# Patient Record
Sex: Female | Born: 1975 | Race: Black or African American | Hispanic: No | Marital: Single | Smoking: Current every day smoker
Health system: Southern US, Community
[De-identification: ages and names within clinical notes are randomized; demographics above are authoritative.]

## PROBLEM LIST (undated history)

## (undated) DIAGNOSIS — F79 Unspecified intellectual disabilities: Secondary | ICD-10-CM

## (undated) DIAGNOSIS — F329 Major depressive disorder, single episode, unspecified: Secondary | ICD-10-CM

## (undated) DIAGNOSIS — F3130 Bipolar disorder, current episode depressed, mild or moderate severity, unspecified: Secondary | ICD-10-CM

## (undated) DIAGNOSIS — F32A Depression, unspecified: Secondary | ICD-10-CM

## (undated) HISTORY — PX: SALPINGECTOMY: SHX328

## (undated) HISTORY — PX: NO PAST SURGERIES: SHX2092

---

## 1997-12-24 ENCOUNTER — Emergency Department (HOSPITAL_COMMUNITY): Admission: EM | Admit: 1997-12-24 | Discharge: 1997-12-24 | Payer: Self-pay | Admitting: Emergency Medicine

## 1998-02-28 ENCOUNTER — Emergency Department (HOSPITAL_COMMUNITY): Admission: EM | Admit: 1998-02-28 | Discharge: 1998-02-28 | Payer: Self-pay | Admitting: Emergency Medicine

## 1998-09-09 ENCOUNTER — Emergency Department (HOSPITAL_COMMUNITY): Admission: EM | Admit: 1998-09-09 | Discharge: 1998-09-09 | Payer: Self-pay | Admitting: Internal Medicine

## 1998-09-09 ENCOUNTER — Encounter: Payer: Self-pay | Admitting: Internal Medicine

## 1999-01-02 ENCOUNTER — Encounter: Payer: Self-pay | Admitting: Obstetrics

## 1999-01-02 ENCOUNTER — Inpatient Hospital Stay (HOSPITAL_COMMUNITY): Admission: AD | Admit: 1999-01-02 | Discharge: 1999-01-02 | Payer: Self-pay | Admitting: *Deleted

## 1999-01-06 ENCOUNTER — Inpatient Hospital Stay (HOSPITAL_COMMUNITY): Admission: AD | Admit: 1999-01-06 | Discharge: 1999-01-06 | Payer: Self-pay | Admitting: Obstetrics

## 1999-01-17 ENCOUNTER — Inpatient Hospital Stay (HOSPITAL_COMMUNITY): Admission: AD | Admit: 1999-01-17 | Discharge: 1999-01-17 | Payer: Self-pay | Admitting: *Deleted

## 1999-01-26 ENCOUNTER — Inpatient Hospital Stay (HOSPITAL_COMMUNITY): Admission: AD | Admit: 1999-01-26 | Discharge: 1999-01-28 | Payer: Self-pay | Admitting: *Deleted

## 2000-02-08 ENCOUNTER — Inpatient Hospital Stay (HOSPITAL_COMMUNITY): Admission: AD | Admit: 2000-02-08 | Discharge: 2000-02-08 | Payer: Self-pay | Admitting: Obstetrics & Gynecology

## 2000-09-24 ENCOUNTER — Inpatient Hospital Stay (HOSPITAL_COMMUNITY): Admission: AD | Admit: 2000-09-24 | Discharge: 2000-09-24 | Payer: Self-pay | Admitting: *Deleted

## 2001-12-19 ENCOUNTER — Emergency Department (HOSPITAL_COMMUNITY): Admission: EM | Admit: 2001-12-19 | Discharge: 2001-12-19 | Payer: Self-pay | Admitting: Emergency Medicine

## 2002-02-02 ENCOUNTER — Emergency Department (HOSPITAL_COMMUNITY): Admission: EM | Admit: 2002-02-02 | Discharge: 2002-02-02 | Payer: Self-pay | Admitting: Emergency Medicine

## 2003-03-15 ENCOUNTER — Emergency Department (HOSPITAL_COMMUNITY): Admission: EM | Admit: 2003-03-15 | Discharge: 2003-03-15 | Payer: Self-pay | Admitting: Emergency Medicine

## 2003-04-25 ENCOUNTER — Encounter: Payer: Self-pay | Admitting: Emergency Medicine

## 2003-04-25 ENCOUNTER — Ambulatory Visit (HOSPITAL_COMMUNITY): Admission: AD | Admit: 2003-04-25 | Discharge: 2003-04-25 | Payer: Self-pay | Admitting: *Deleted

## 2003-04-25 ENCOUNTER — Encounter (INDEPENDENT_AMBULATORY_CARE_PROVIDER_SITE_OTHER): Payer: Self-pay | Admitting: Specialist

## 2003-06-10 ENCOUNTER — Emergency Department (HOSPITAL_COMMUNITY): Admission: EM | Admit: 2003-06-10 | Discharge: 2003-06-10 | Payer: Self-pay | Admitting: Emergency Medicine

## 2004-02-28 ENCOUNTER — Emergency Department (HOSPITAL_COMMUNITY): Admission: EM | Admit: 2004-02-28 | Discharge: 2004-02-28 | Payer: Self-pay | Admitting: Emergency Medicine

## 2004-05-21 ENCOUNTER — Emergency Department (HOSPITAL_COMMUNITY): Admission: EM | Admit: 2004-05-21 | Discharge: 2004-05-21 | Payer: Self-pay | Admitting: Emergency Medicine

## 2004-05-23 ENCOUNTER — Inpatient Hospital Stay (HOSPITAL_COMMUNITY): Admission: AD | Admit: 2004-05-23 | Discharge: 2004-05-23 | Payer: Self-pay | Admitting: Obstetrics and Gynecology

## 2004-05-26 ENCOUNTER — Inpatient Hospital Stay (HOSPITAL_COMMUNITY): Admission: AD | Admit: 2004-05-26 | Discharge: 2004-05-26 | Payer: Self-pay | Admitting: Obstetrics and Gynecology

## 2004-05-29 ENCOUNTER — Inpatient Hospital Stay (HOSPITAL_COMMUNITY): Admission: AD | Admit: 2004-05-29 | Discharge: 2004-05-29 | Payer: Self-pay | Admitting: Obstetrics and Gynecology

## 2004-06-06 ENCOUNTER — Inpatient Hospital Stay (HOSPITAL_COMMUNITY): Admission: AD | Admit: 2004-06-06 | Discharge: 2004-06-06 | Payer: Self-pay | Admitting: *Deleted

## 2004-06-08 ENCOUNTER — Ambulatory Visit (HOSPITAL_COMMUNITY): Admission: AD | Admit: 2004-06-08 | Discharge: 2004-06-09 | Payer: Self-pay | Admitting: Obstetrics and Gynecology

## 2004-06-09 ENCOUNTER — Encounter (INDEPENDENT_AMBULATORY_CARE_PROVIDER_SITE_OTHER): Payer: Self-pay | Admitting: Specialist

## 2004-12-09 ENCOUNTER — Emergency Department (HOSPITAL_COMMUNITY): Admission: EM | Admit: 2004-12-09 | Discharge: 2004-12-09 | Payer: Self-pay | Admitting: Emergency Medicine

## 2004-12-15 ENCOUNTER — Emergency Department (HOSPITAL_COMMUNITY): Admission: EM | Admit: 2004-12-15 | Discharge: 2004-12-15 | Payer: Self-pay | Admitting: Emergency Medicine

## 2005-02-10 ENCOUNTER — Emergency Department (HOSPITAL_COMMUNITY): Admission: EM | Admit: 2005-02-10 | Discharge: 2005-02-10 | Payer: Self-pay | Admitting: Emergency Medicine

## 2005-06-18 ENCOUNTER — Emergency Department (HOSPITAL_COMMUNITY): Admission: EM | Admit: 2005-06-18 | Discharge: 2005-06-19 | Payer: Self-pay | Admitting: Emergency Medicine

## 2006-02-27 IMAGING — US US PELVIS COMPLETE MODIFY
1 series · 14 of 25 positions shown · non-contrast
Comparison: none

CLINICAL DATA: Left-sided pelvic pain.  Positive pregnancy test with
quantitative beta HCG of 11/01/03.  Evaluate for ectopic pregnancy.

[Series 1: gyn · 0.23mm/px · 14 of 48 slices shown]
[im 1/48]
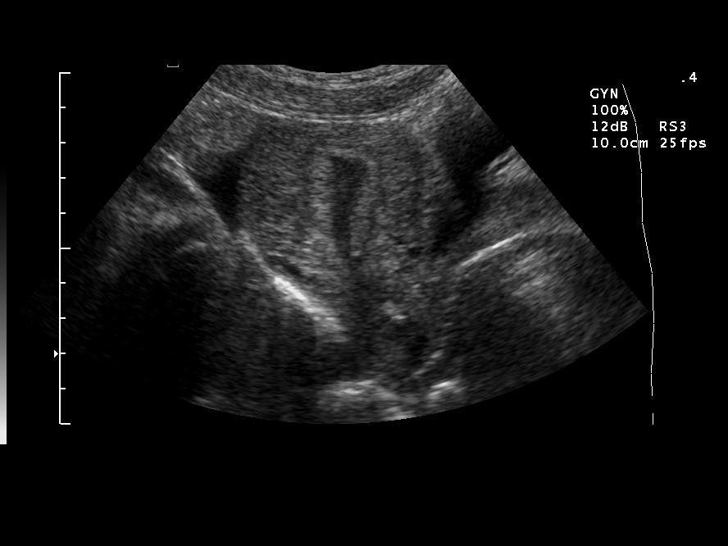
[im 4/48]
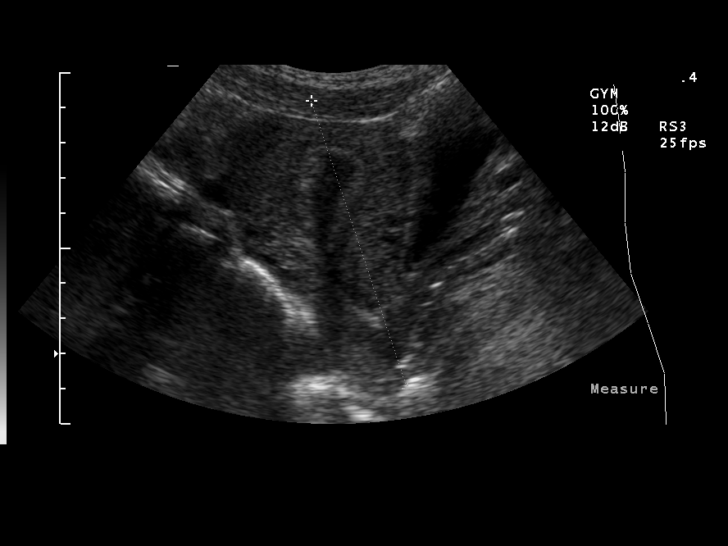
[im 8/48]
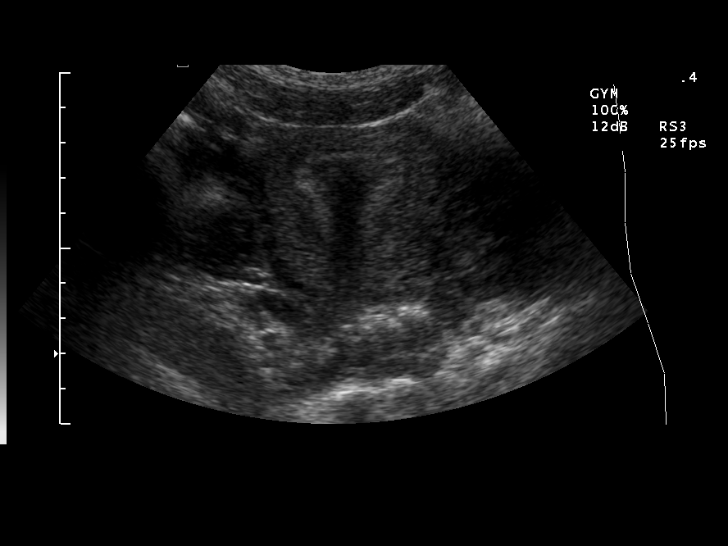
[im 12/48]
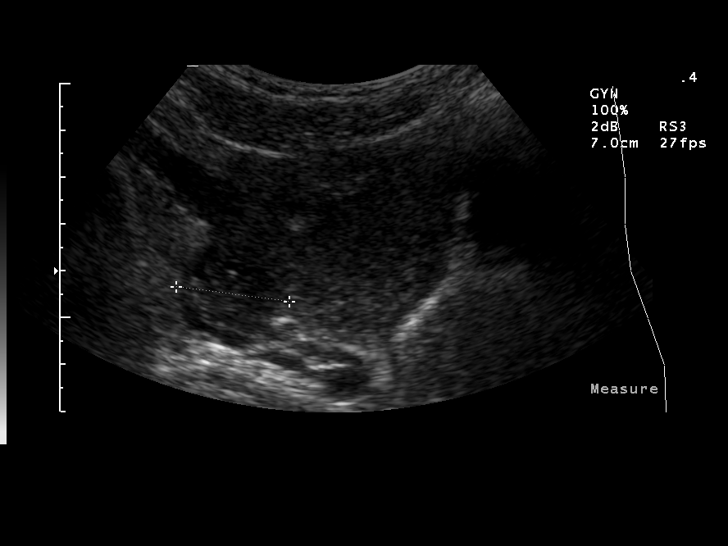
[im 16/48]
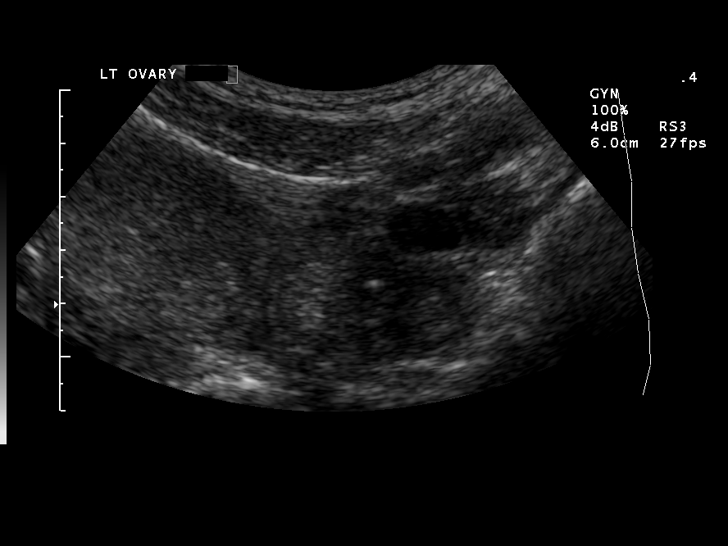
[im 18/48]
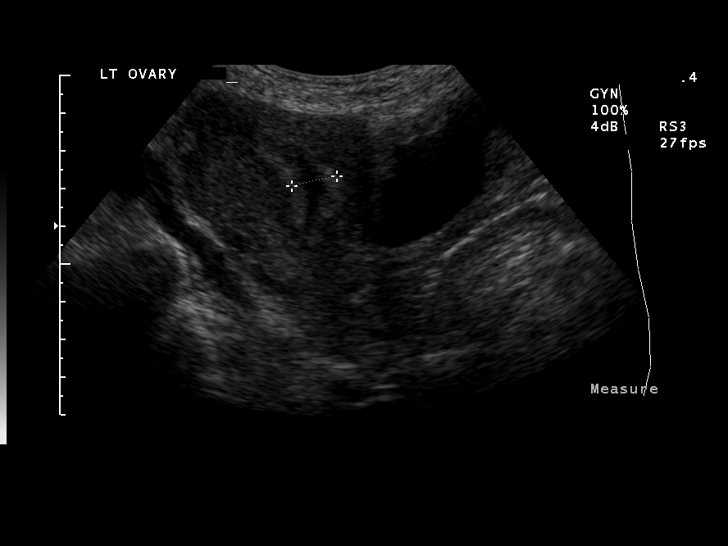
[im 22/48]
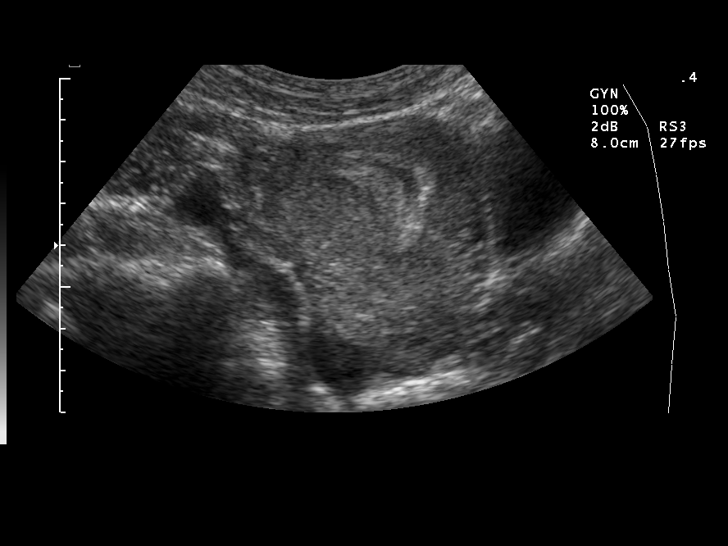
[im 26/48]
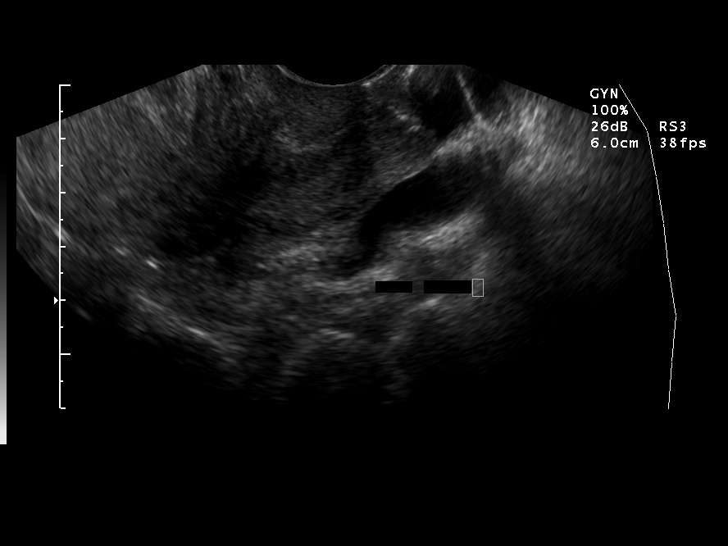
[im 30/48]
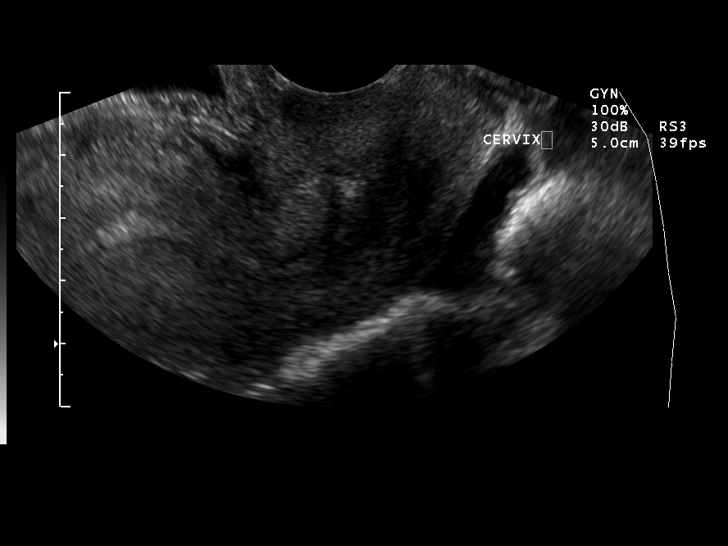
[im 32/48]
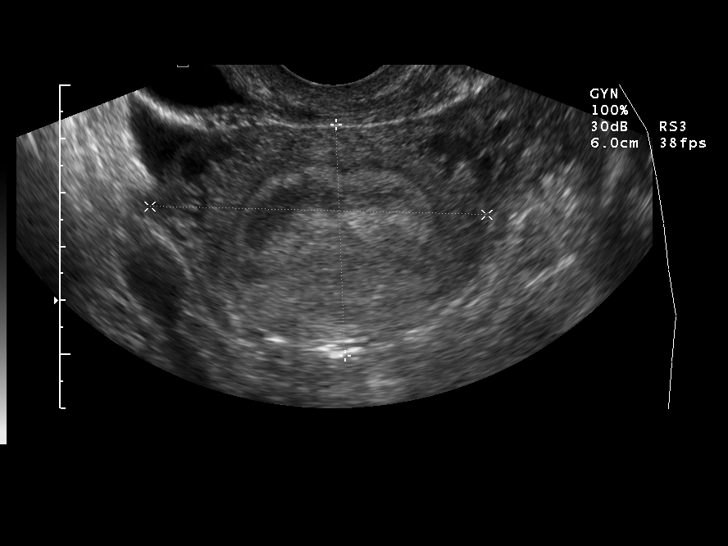
[im 36/48]
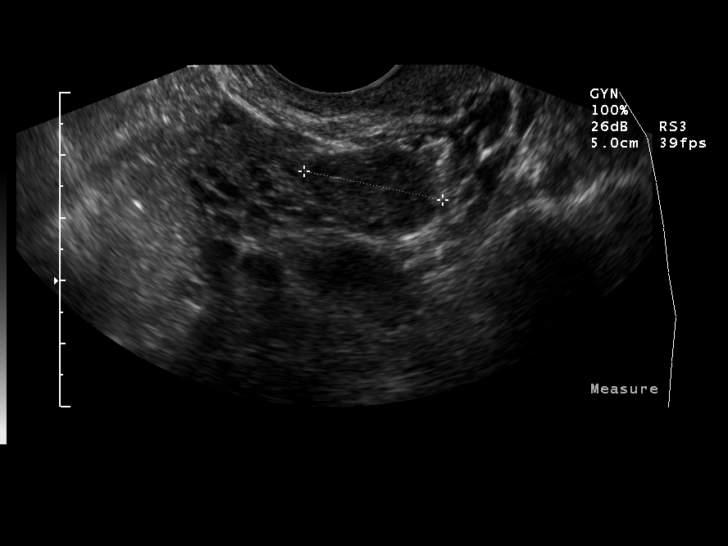
[im 40/48]
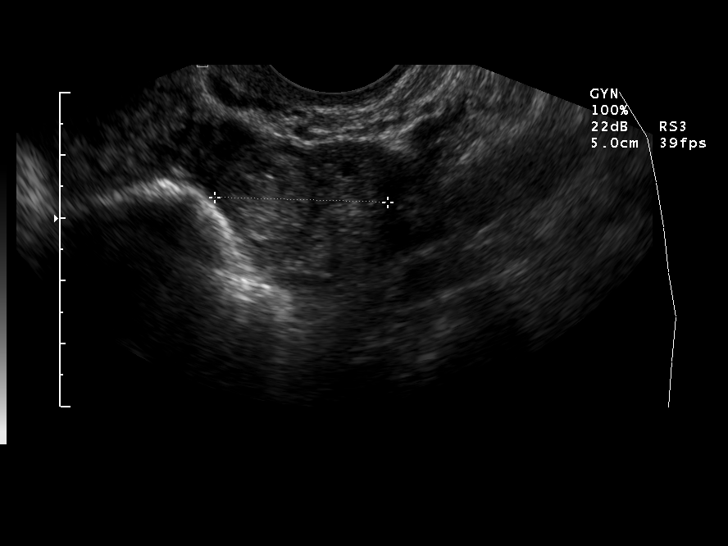
[im 44/48]
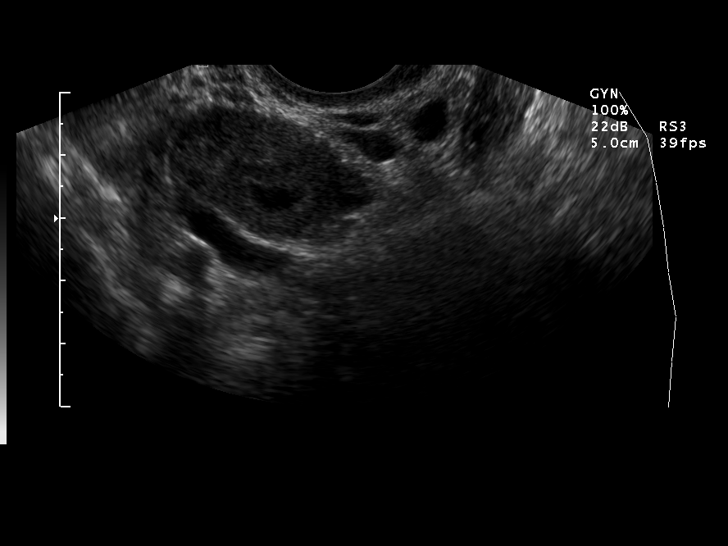
[im 48/48]
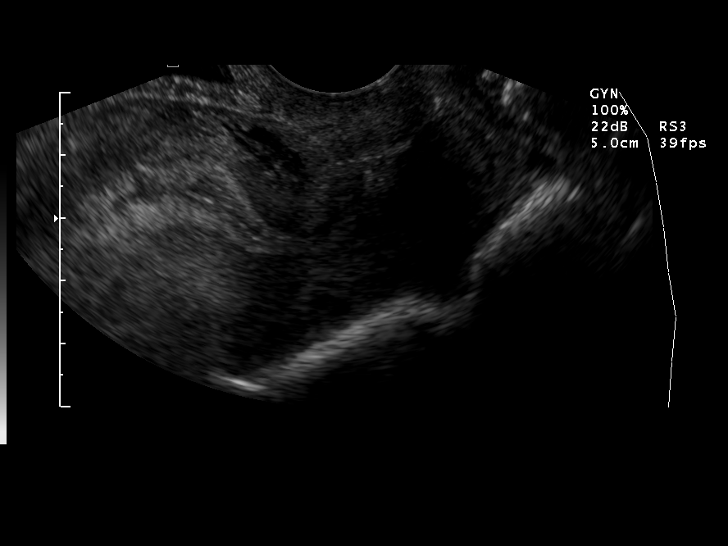

[14 of 25 positions shown; findings below may reference images not displayed]

OBSTETRICAL ULTRASOUND WITH TRANSVAGINAL:

The endometrium has a thickened, decidualized appearance and measures
approximately 1.2 cm in thickness.  However, there is no evidence of an
intrauterine gestational sac.  No fibroids or other uterine abnormalities are
identified.  

The right ovary is normal in appearance.  The left ovary contains a small
collapsing cyst measuring approximately 9 mm, which is suspicious for a
regressive corpus luteum.  There is also a 1.7 cm slightly hyperechoic mass
along the margin of the left ovary.  This is suspicious for an ectopic
pregnancy.  There is a small amount of free fluid seen in the pelvic cul-de-sac.
IMPRESSION: 1.  1.7 cm mass along the margin of the right ovary and small amount of free
fluid within the pelvic cul-de-sac.  These findings are non specific, but raise
suspicion for an ectopic pregnancy.  

2.  Thickened endometrium, with no evidence of intrauterine gestational sac.  

3.  These results were discussed with Dr. Bombey by telephone in the emergency
room.

## 2006-03-22 ENCOUNTER — Emergency Department (HOSPITAL_COMMUNITY): Admission: EM | Admit: 2006-03-22 | Discharge: 2006-03-23 | Payer: Self-pay | Admitting: Emergency Medicine

## 2006-06-03 ENCOUNTER — Emergency Department (HOSPITAL_COMMUNITY): Admission: EM | Admit: 2006-06-03 | Discharge: 2006-06-03 | Payer: Self-pay | Admitting: Emergency Medicine

## 2006-09-23 IMAGING — CR DG ANKLE COMPLETE 3+V*L*
3 series · 3 of 3 positions shown · non-contrast
Comparison: None.

CLINICAL DATA: Ankle injury with pain over the lateral malleolus.  
 LEFT ANKLE ? 3 VIEWS:

[view not recorded (1 of 3)]
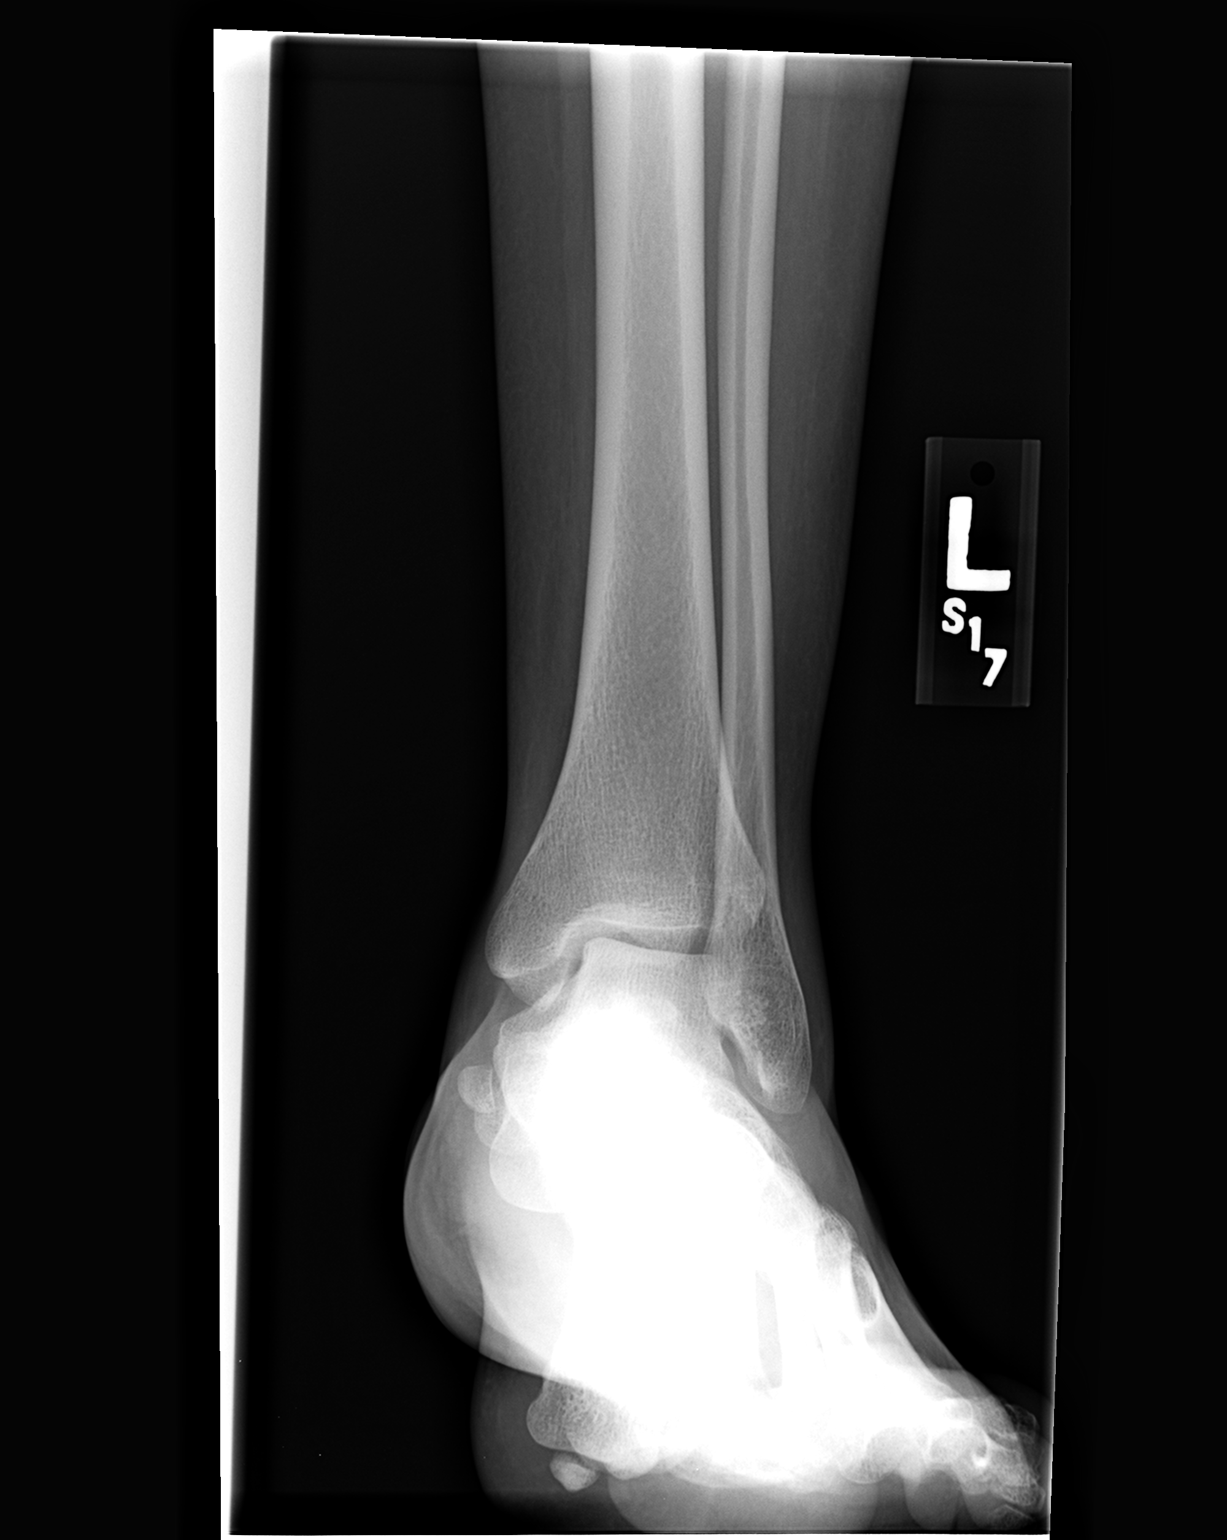

[view not recorded (2 of 3)]
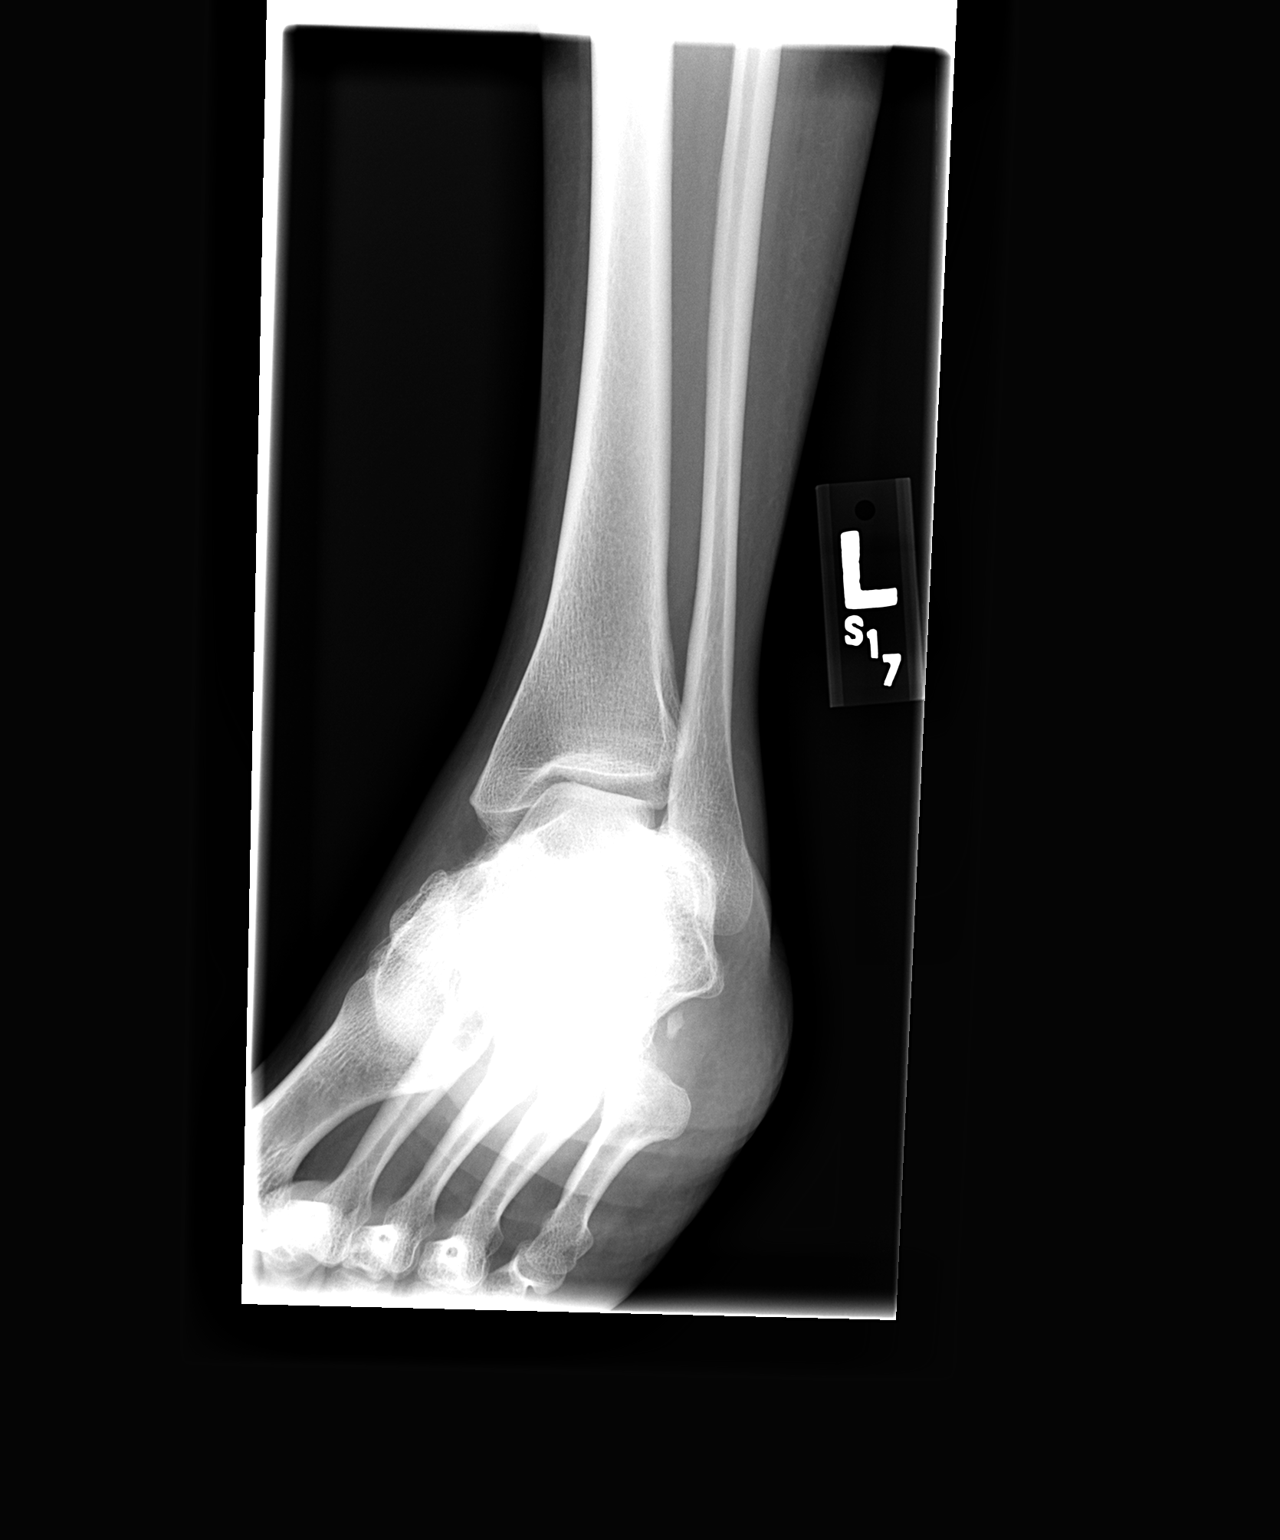

[view not recorded (3 of 3)]
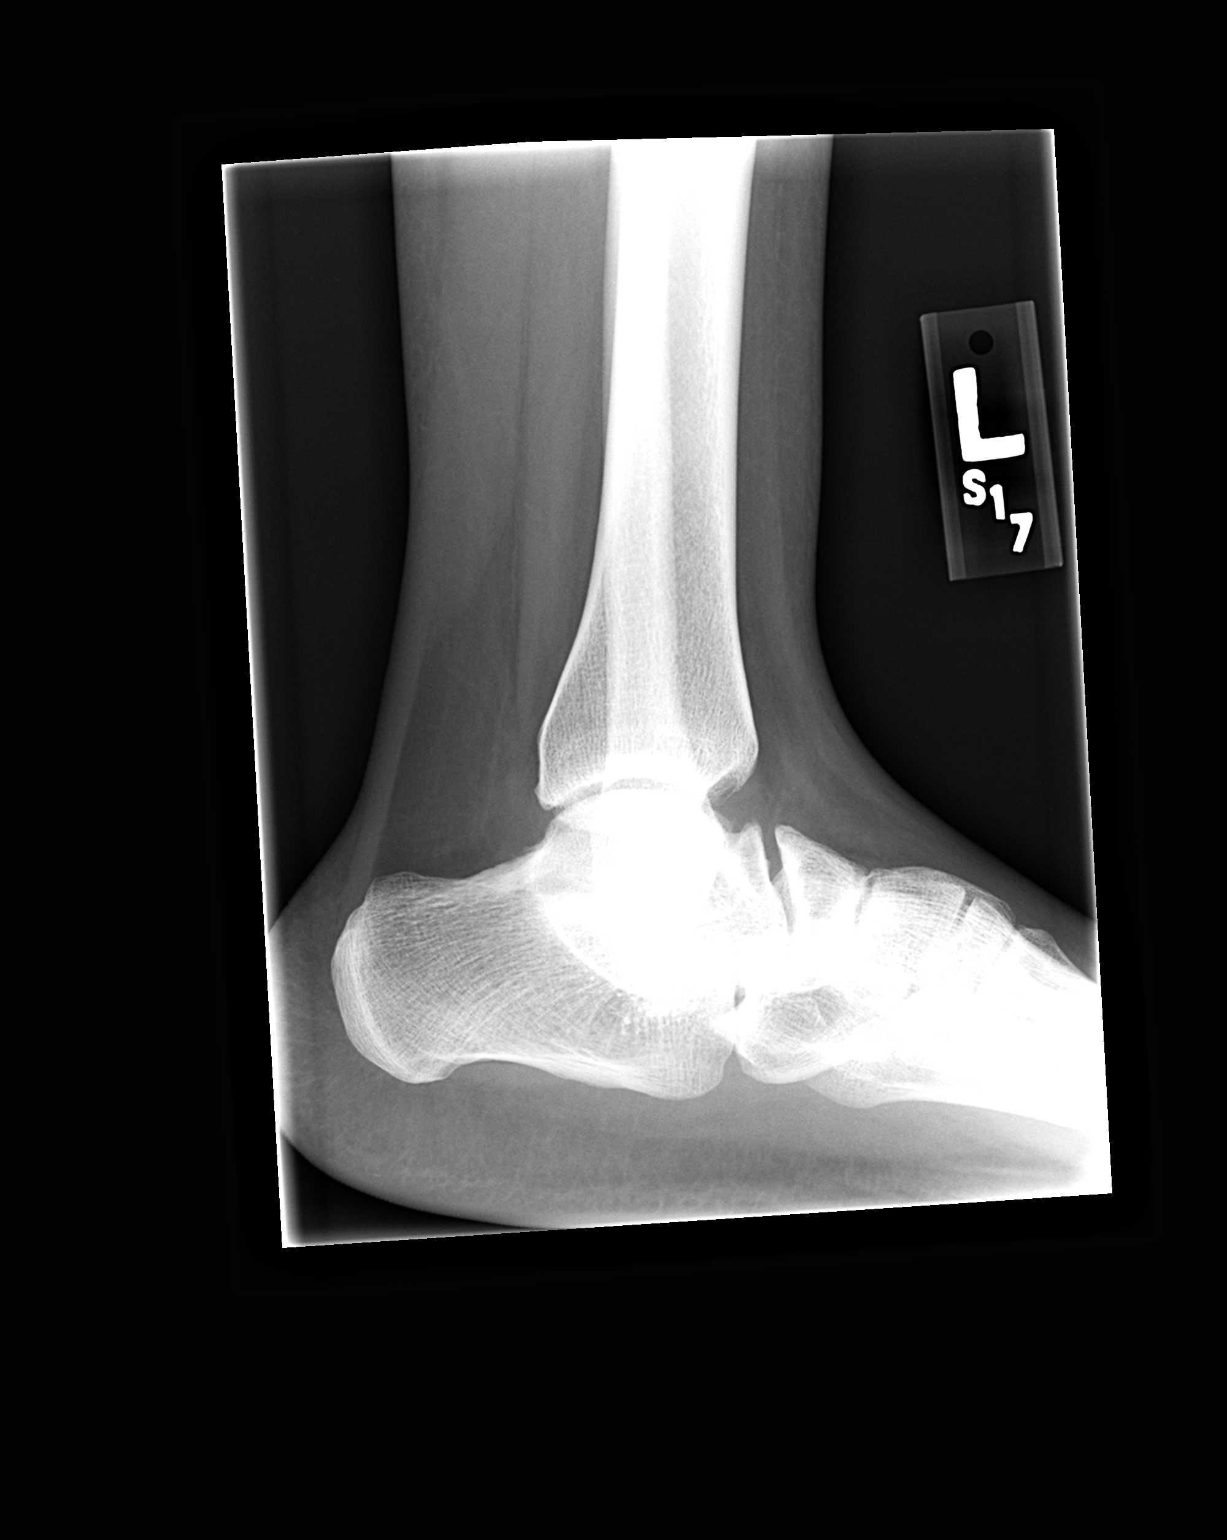

[3 of 3 positions shown; findings below may reference images not displayed]

FINDINGS: No evidence for acute fracture or dislocation.  Overlying soft tissues are unremarkable.
IMPRESSION: No acute bony abnormality.

## 2006-10-28 ENCOUNTER — Emergency Department (HOSPITAL_COMMUNITY): Admission: EM | Admit: 2006-10-28 | Discharge: 2006-10-28 | Payer: Self-pay | Admitting: Family Medicine

## 2006-12-24 ENCOUNTER — Emergency Department (HOSPITAL_COMMUNITY): Admission: EM | Admit: 2006-12-24 | Discharge: 2006-12-24 | Payer: Self-pay | Admitting: Emergency Medicine

## 2007-08-09 ENCOUNTER — Emergency Department (HOSPITAL_COMMUNITY): Admission: EM | Admit: 2007-08-09 | Discharge: 2007-08-09 | Payer: Self-pay | Admitting: Emergency Medicine

## 2007-08-25 ENCOUNTER — Inpatient Hospital Stay (HOSPITAL_COMMUNITY): Admission: RE | Admit: 2007-08-25 | Discharge: 2007-09-03 | Payer: Self-pay | Admitting: *Deleted

## 2007-08-25 ENCOUNTER — Ambulatory Visit: Payer: Self-pay | Admitting: *Deleted

## 2008-01-02 ENCOUNTER — Emergency Department (HOSPITAL_COMMUNITY): Admission: EM | Admit: 2008-01-02 | Discharge: 2008-01-02 | Payer: Self-pay | Admitting: Emergency Medicine

## 2008-05-20 ENCOUNTER — Emergency Department (HOSPITAL_COMMUNITY): Admission: EM | Admit: 2008-05-20 | Discharge: 2008-05-20 | Payer: Self-pay | Admitting: Emergency Medicine

## 2009-12-12 ENCOUNTER — Other Ambulatory Visit: Payer: Self-pay | Admitting: Emergency Medicine

## 2009-12-13 ENCOUNTER — Other Ambulatory Visit: Payer: Self-pay | Admitting: Emergency Medicine

## 2009-12-13 ENCOUNTER — Ambulatory Visit: Payer: Self-pay | Admitting: Psychiatry

## 2009-12-13 ENCOUNTER — Inpatient Hospital Stay (HOSPITAL_COMMUNITY): Admission: EM | Admit: 2009-12-13 | Discharge: 2009-12-23 | Payer: Self-pay | Admitting: Psychiatry

## 2010-03-27 ENCOUNTER — Inpatient Hospital Stay (HOSPITAL_COMMUNITY): Admission: RE | Admit: 2010-03-27 | Discharge: 2010-03-31 | Payer: Self-pay | Admitting: Psychiatry

## 2010-03-27 ENCOUNTER — Ambulatory Visit: Payer: Self-pay | Admitting: Psychiatry

## 2010-04-21 ENCOUNTER — Emergency Department (HOSPITAL_COMMUNITY): Admission: EM | Admit: 2010-04-21 | Discharge: 2010-04-21 | Payer: Self-pay | Admitting: Emergency Medicine

## 2010-04-21 ENCOUNTER — Inpatient Hospital Stay (HOSPITAL_COMMUNITY): Admission: AD | Admit: 2010-04-21 | Discharge: 2010-04-26 | Payer: Self-pay | Admitting: Psychiatry

## 2010-06-28 ENCOUNTER — Inpatient Hospital Stay (HOSPITAL_COMMUNITY)
Admission: AD | Admit: 2010-06-28 | Discharge: 2010-07-05 | Payer: Self-pay | Source: Home / Self Care | Attending: Psychiatry | Admitting: Psychiatry

## 2010-07-26 ENCOUNTER — Emergency Department (HOSPITAL_COMMUNITY)
Admission: EM | Admit: 2010-07-26 | Discharge: 2010-07-26 | Payer: Self-pay | Source: Home / Self Care | Admitting: Family Medicine

## 2010-07-31 LAB — POCT URINALYSIS DIPSTICK
Nitrite: NEGATIVE
Protein, ur: 30 mg/dL — AB
Specific Gravity, Urine: 1.025 (ref 1.005–1.030)
Urobilinogen, UA: 2 mg/dL — ABNORMAL HIGH (ref 0.0–1.0)
pH: 6.5 (ref 5.0–8.0)

## 2010-07-31 LAB — HIV ANTIBODY (ROUTINE TESTING W REFLEX): HIV: NONREACTIVE

## 2010-07-31 LAB — URINE CULTURE
Colony Count: NO GROWTH
Culture  Setup Time: 201201181552
Culture: NO GROWTH

## 2010-07-31 LAB — RPR: RPR Ser Ql: NONREACTIVE

## 2010-07-31 LAB — GC/CHLAMYDIA PROBE AMP, GENITAL
Chlamydia, DNA Probe: NEGATIVE
GC Probe Amp, Genital: NEGATIVE

## 2010-07-31 LAB — WET PREP, GENITAL

## 2010-09-18 LAB — COMPREHENSIVE METABOLIC PANEL
ALT: 13 U/L (ref 0–35)
AST: 18 U/L (ref 0–37)
Albumin: 3.7 g/dL (ref 3.5–5.2)
Alkaline Phosphatase: 51 U/L (ref 39–117)
BUN: 7 mg/dL (ref 6–23)
CO2: 25 mEq/L (ref 19–32)
Calcium: 9.3 mg/dL (ref 8.4–10.5)
Chloride: 104 mEq/L (ref 96–112)
Creatinine, Ser: 0.76 mg/dL (ref 0.4–1.2)
GFR calc Af Amer: >60 mL/min (ref >60)
GFR calc non Af Amer: >60 mL/min (ref >60)
Glucose, Bld: 70 mg/dL (ref 70–99)
Potassium: 3.7 mEq/L (ref 3.5–5.1)
Sodium: 138 mEq/L (ref 135–145)
Total Bilirubin: 0.6 mg/dL (ref 0.3–1.2)
Total Protein: 6.9 g/dL (ref 6.0–8.3)

## 2010-09-18 LAB — URINE DRUGS OF ABUSE SCREEN W ALC, ROUTINE (REF LAB)
Amphetamine Screen, Ur: NEGATIVE
Barbiturate Quant, Ur: NEGATIVE
Benzodiazepines.: NEGATIVE
Cocaine Metabolites: NEGATIVE
Creatinine,U: 291.9 mg/dL
Ethyl Alcohol: <10 mg/dL (ref <10)
Marijuana Metabolite: NEGATIVE
Methadone: NEGATIVE
Opiate Screen, Urine: NEGATIVE
Phencyclidine (PCP): NEGATIVE
Propoxyphene: NEGATIVE

## 2010-09-18 LAB — CBC
HCT: 36.3 % (ref 36.0–46.0)
Hemoglobin: 12.1 g/dL (ref 12.0–15.0)
MCH: 29.4 pg (ref 26.0–34.0)
MCHC: 33.3 g/dL (ref 30.0–36.0)
MCV: 88.1 fL (ref 78.0–100.0)
Platelets: 201 10*3/uL (ref 150–400)
RBC: 4.12 MIL/uL (ref 3.87–5.11)
RDW: 12.1 % (ref 11.5–15.5)
WBC: 7.4 10*3/uL (ref 4.0–10.5)

## 2010-09-18 LAB — URINALYSIS, ROUTINE W REFLEX MICROSCOPIC
Glucose, UA: NEGATIVE mg/dL
Ketones, ur: NEGATIVE mg/dL
Nitrite: NEGATIVE
Protein, ur: NEGATIVE mg/dL
Specific Gravity, Urine: 1.029 (ref 1.005–1.030)
Urobilinogen, UA: 1 mg/dL (ref 0.0–1.0)
pH: 6 (ref 5.0–8.0)

## 2010-09-18 LAB — URINE MICROSCOPIC-ADD ON

## 2010-09-18 LAB — PREGNANCY, URINE: Preg Test, Ur: NEGATIVE

## 2010-09-20 LAB — DIFFERENTIAL
Basophils Absolute: 0 10*3/uL (ref 0.0–0.1)
Basophils Relative: 1 % (ref 0–1)
Eosinophils Absolute: 0.1 10*3/uL (ref 0.0–0.7)
Eosinophils Relative: 3 % (ref 0–5)
Lymphocytes Relative: 34 % (ref 12–46)
Lymphs Abs: 1.8 10*3/uL (ref 0.7–4.0)
Monocytes Absolute: 0.4 10*3/uL (ref 0.1–1.0)
Monocytes Relative: 7 % (ref 3–12)
Neutro Abs: 3 10*3/uL (ref 1.7–7.7)
Neutrophils Relative %: 56 % (ref 43–77)

## 2010-09-20 LAB — RAPID URINE DRUG SCREEN, HOSP PERFORMED
Amphetamines: NOT DETECTED
Barbiturates: NOT DETECTED
Benzodiazepines: NOT DETECTED
Cocaine: NOT DETECTED
Opiates: NOT DETECTED
Tetrahydrocannabinol: NOT DETECTED

## 2010-09-20 LAB — CBC
HCT: 37.1 % (ref 36.0–46.0)
Hemoglobin: 12.4 g/dL (ref 12.0–15.0)
MCH: 29.9 pg (ref 26.0–34.0)
MCHC: 33.4 g/dL (ref 30.0–36.0)
MCV: 89.4 fL (ref 78.0–100.0)
Platelets: 191 10*3/uL (ref 150–400)
RBC: 4.15 MIL/uL (ref 3.87–5.11)
RDW: 12.5 % (ref 11.5–15.5)
WBC: 5.4 10*3/uL (ref 4.0–10.5)

## 2010-09-20 LAB — BASIC METABOLIC PANEL
BUN: 5 mg/dL — ABNORMAL LOW (ref 6–23)
CO2: 28 mEq/L (ref 19–32)
Calcium: 9.3 mg/dL (ref 8.4–10.5)
Chloride: 106 mEq/L (ref 96–112)
Creatinine, Ser: 0.75 mg/dL (ref 0.4–1.2)
GFR calc Af Amer: >60 mL/min (ref >60)
GFR calc non Af Amer: >60 mL/min (ref >60)
Glucose, Bld: 80 mg/dL (ref 70–99)
Potassium: 3.9 mEq/L (ref 3.5–5.1)
Sodium: 140 mEq/L (ref 135–145)

## 2010-09-20 LAB — POCT PREGNANCY, URINE: Preg Test, Ur: NEGATIVE

## 2010-09-20 LAB — ETHANOL: Alcohol, Ethyl (B): <5 mg/dL (ref 0–10)

## 2010-09-21 LAB — DIFFERENTIAL
Basophils Absolute: 0.1 10*3/uL (ref 0.0–0.1)
Basophils Relative: 1 % (ref 0–1)
Eosinophils Absolute: 0.2 10*3/uL (ref 0.0–0.7)
Eosinophils Relative: 4 % (ref 0–5)
Lymphocytes Relative: 44 % (ref 12–46)
Lymphs Abs: 2.4 10*3/uL (ref 0.7–4.0)
Monocytes Absolute: 0.4 10*3/uL (ref 0.1–1.0)
Monocytes Relative: 7 % (ref 3–12)
Neutro Abs: 2.4 10*3/uL (ref 1.7–7.7)
Neutrophils Relative %: 44 % (ref 43–77)

## 2010-09-21 LAB — URINE MICROSCOPIC-ADD ON

## 2010-09-21 LAB — COMPREHENSIVE METABOLIC PANEL
ALT: 12 U/L (ref 0–35)
AST: 16 U/L (ref 0–37)
Albumin: 3.9 g/dL (ref 3.5–5.2)
Alkaline Phosphatase: 46 U/L (ref 39–117)
BUN: 6 mg/dL (ref 6–23)
CO2: 27 mEq/L (ref 19–32)
Calcium: 9.2 mg/dL (ref 8.4–10.5)
Chloride: 107 mEq/L (ref 96–112)
Creatinine, Ser: 0.89 mg/dL (ref 0.4–1.2)
GFR calc Af Amer: >60 mL/min (ref >60)
GFR calc non Af Amer: >60 mL/min (ref >60)
Glucose, Bld: 95 mg/dL (ref 70–99)
Potassium: 3.8 mEq/L (ref 3.5–5.1)
Sodium: 138 mEq/L (ref 135–145)
Total Bilirubin: 0.4 mg/dL (ref 0.3–1.2)
Total Protein: 7.1 g/dL (ref 6.0–8.3)

## 2010-09-21 LAB — URINALYSIS, ROUTINE W REFLEX MICROSCOPIC
Bilirubin Urine: NEGATIVE
Glucose, UA: NEGATIVE mg/dL
Hgb urine dipstick: NEGATIVE
Ketones, ur: NEGATIVE mg/dL
Nitrite: NEGATIVE
Protein, ur: NEGATIVE mg/dL
Specific Gravity, Urine: 1.015 (ref 1.005–1.030)
Urobilinogen, UA: 1 mg/dL (ref 0.0–1.0)
pH: 7 (ref 5.0–8.0)

## 2010-09-21 LAB — CBC
HCT: 36.2 % (ref 36.0–46.0)
Hemoglobin: 11.9 g/dL — ABNORMAL LOW (ref 12.0–15.0)
MCH: 29.5 pg (ref 26.0–34.0)
MCHC: 32.9 g/dL (ref 30.0–36.0)
MCV: 89.6 fL (ref 78.0–100.0)
Platelets: 176 10*3/uL (ref 150–400)
RBC: 4.04 MIL/uL (ref 3.87–5.11)
RDW: 12.6 % (ref 11.5–15.5)
WBC: 5.4 10*3/uL (ref 4.0–10.5)

## 2010-09-21 LAB — URINE DRUGS OF ABUSE SCREEN W ALC, ROUTINE (REF LAB)
Barbiturate Quant, Ur: NEGATIVE
Benzodiazepines.: NEGATIVE
Methadone: NEGATIVE
Phencyclidine (PCP): NEGATIVE
Propoxyphene: NEGATIVE

## 2010-09-21 LAB — TSH: TSH: 2.385 u[IU]/mL (ref 0.350–4.500)

## 2010-09-25 LAB — POCT PREGNANCY, URINE: Preg Test, Ur: NEGATIVE

## 2010-09-25 LAB — URINALYSIS, ROUTINE W REFLEX MICROSCOPIC
Bilirubin Urine: NEGATIVE
Bilirubin Urine: NEGATIVE
Glucose, UA: NEGATIVE mg/dL
Glucose, UA: NEGATIVE mg/dL
Hgb urine dipstick: NEGATIVE
Nitrite: NEGATIVE
Protein, ur: NEGATIVE mg/dL
Specific Gravity, Urine: 1.03 (ref 1.005–1.030)
Specific Gravity, Urine: 1.029 (ref 1.005–1.030)
Urobilinogen, UA: 1 mg/dL (ref 0.0–1.0)
Urobilinogen, UA: 1 mg/dL (ref 0.0–1.0)
pH: 7 (ref 5.0–8.0)

## 2010-09-25 LAB — URINE MICROSCOPIC-ADD ON

## 2010-09-25 LAB — CBC
HCT: 38.9 % (ref 36.0–46.0)
Hemoglobin: 13 g/dL (ref 12.0–15.0)
MCHC: 33.5 g/dL (ref 30.0–36.0)
MCV: 89.5 fL (ref 78.0–100.0)
RDW: 13 % (ref 11.5–15.5)

## 2010-09-25 LAB — HEPATIC FUNCTION PANEL
Bilirubin, Direct: 0.1 mg/dL (ref 0.0–0.3)
Indirect Bilirubin: 0.3 mg/dL (ref 0.3–0.9)

## 2010-09-25 LAB — DIFFERENTIAL
Basophils Absolute: 0 10*3/uL (ref 0.0–0.1)
Basophils Relative: 0 % (ref 0–1)
Eosinophils Absolute: 0.2 10*3/uL (ref 0.0–0.7)
Eosinophils Relative: 2 % (ref 0–5)
Lymphocytes Relative: 31 % (ref 12–46)
Monocytes Absolute: 0.4 10*3/uL (ref 0.1–1.0)

## 2010-09-25 LAB — GC/CHLAMYDIA PROBE AMP, URINE
Chlamydia, Swab/Urine, PCR: NEGATIVE
GC Probe Amp, Urine: NEGATIVE

## 2010-09-25 LAB — TSH: TSH: 3.054 u[IU]/mL (ref 0.350–4.500)

## 2010-09-25 LAB — POCT I-STAT, CHEM 8
BUN: 5 mg/dL — ABNORMAL LOW (ref 6–23)
Calcium, Ion: 1.21 mmol/L (ref 1.12–1.32)
TCO2: 28 mmol/L (ref 0–100)

## 2010-09-25 LAB — HIV ANTIBODY (ROUTINE TESTING W REFLEX): HIV: NONREACTIVE

## 2010-09-25 LAB — RAPID URINE DRUG SCREEN, HOSP PERFORMED
Amphetamines: NOT DETECTED
Benzodiazepines: NOT DETECTED

## 2010-09-25 LAB — RPR: RPR Ser Ql: NONREACTIVE

## 2010-11-21 NOTE — H&P (Signed)
NAMENEMESIS, RAINWATER             ACCOUNT NO.:  [id number]   MEDICAL RECORD NO.:  [id number]          PATIENT TYPE:  IPS   LOCATION:  0302                          FACILITY:  [facility]   PHYSICIAN:  Jasmine Pang, M.D. DATE OF BIRTH:  1975-11-06   DATE OF ADMISSION:  08/25/2007  DATE OF DISCHARGE:                       PSYCHIATRIC ADMISSION ASSESSMENT   DATE OF ASSESSMENT:  August 26, 2007, at 11:05.   IDENTIFYING INFORMATION:  A 35 year old [place] American female.  This  is a voluntary admission.  She is single.   HISTORY OF PRESENT ILLNESS:  This 55 year old mother of 2 presented on  referral from [facility] where she is being sponsored  for this stay.  She had gone to stay with her best friend after feeling  hopeless about her living situation at home, had expressed some thoughts  of possibly just going ahead and killing herself by cutting herself with  a razor and   Dictation ended at this point      Claris Che A. Lorin Picket, N.P.      Jasmine Pang, M.D.  Electronically Signed    MAS/MEDQ  D:  08/26/2007  T:  08/27/2007  Job:  [id number]

## 2010-11-21 NOTE — H&P (Signed)
Yolanda [name], Yolanda [name]             ACCOUNT NO.:  [id number]   MEDICAL RECORD NO.:  [id number]          PATIENT TYPE:  IPS   LOCATION:  0302                          [facility]ILITY[facility]   PHYSICIAN:  Jasmine Pang, M.D. DATE OF BIRTH:  1975/07/15   DATE OF ADMISSION:  08/25/2007  DATE OF DISCHARGE:                       PSYCHIATRIC ADMISSION ASSESSMENT   DATE OF ASSESSMENT:  August 26, 2007,  at 11:00 a.m.   IDENTIFYING INFORMATION:  A 35 year old African-American female who is  single.  This is a voluntary admission.   HISTORY OF PRESENT ILLNESS:  This patient presents on sponsorship from  [facility] after her best friend took her there.  She  had gone to her best friend's house tearful and upset, feeling she could  not go on with her life and having thoughts of possibly cutting her  wrists.  She reports that she has been living in her mother's home for  the past year. She reports the mother is controlling, expects her to  clean the house, orders her around a lot, is not willing to share in any  of the household duties, and Yolanda [name] feels that she cannot do anything  right.  She is very hopeless about the possibility of ever getting back  on her feet and living independently with her children again.  She has  been unable to get a job, has no income of her own, and is subsisting  dependent on her mother, gets Medicaid and food stamps for herself and  the children.  She reports disrupted sleep with constant worry for the  past 4 weeks, sleeping only 2-3 hours at a time, and reports that she  has lost 10 pounds within the past month, is unable to eat or sleep well  due to her worries, also endorses depressed mood and crying spells daily  for the past 2 weeks.  She denies any homicidal thoughts.  Denies any  hallucinations.   PAST PSYCHIATRIC HISTORY:  First be [facility] admission,  first inpatient treatment.  She reports that she has never even  been  treated as an outpatient in the past for depression or other problems.  No history of brain injury or learning disabilities. No history of  psychotropics.  She reports that she has a distant history of using  cocaine and has been abstinent now for more than a year, and when using  cocaine, had also abused alcohol, none in more than a year.   SOCIAL HISTORY:  Born and raised in [place], [place].  She has  two daughters, ages 34 and 24, that attended elementary school regularly.  She moved in with her mother and the mother's boyfriend about a year ago  after leaving the grandmother's house which she says was rat infested  and of substandard conditions.  The patient has no income of her own,  denies any legal problems. She does report that she has several friends,  including her best friend, who is her main support, and she is involved  with a church group and attends church regularly.   FAMILY HISTORY:  Not available.  MEDICAL HISTORY:  The patient is followed at [facility] as her  primary care practitioners. Medical problems are none.  Past medical  history is remarkable for two vaginal deliveries and the excision of a  ruptured ectopic pregnancy x1.   DRUG ALLERGIES:  None.   Positive physical findings on physical exam was done here, noted in the  record and is unremarkable.  Healthy female, petite in no distress with  normal vital signs, diagnostics and nonfocal neurological exam.   REVIEW OF SYSTEMS:  CONSTITUTIONAL:  No fever or rash.  Sleep decreased  to 3 hours per night for at least 2 weeks, possibly 4, has lost 10  pounds. Appetite has been poor for the past 30 days.  RESPIRATORY:  No  shortness of breath.  No post nocturnal dyspnea.  No wheezing or cough.  GI:  Bowels are regular without medications or laxatives. ENDOCRINE:  No  intolerance to heat or cold, no hot flashes, no evidence of vasomotor  instability. NEUROLOGIC:  No headaches.  No  blackouts.  No fainting  spells.  CARDIAC:  No palpitations.  No chest pain.  MUSCULOSKELETAL:  No muscular aches or pains.   PHYSICAL EXAMINATION:  VITAL SIGNS:  She is 4 feet 10 inches tall, 48.6  kg.  She is afebrile, pulse 63, blood pressure 106/73, respirations 20  and clear.  HEAD:  Normocephalic and atraumatic.  ENT:  PERRLA.  Sclerae are nonicteric.  NECK:  Supple.  No thyromegaly.  CHEST:  Clear to auscultation.  BREASTS:  Exam deferred.  CARDIOVASCULAR:  S1-S2 is heard.  No clicks, murmurs or gallops.  ABDOMEN:  Soft, nontender, nondistended.  PELVIC:  Deferred.  GENITOURINARY:  Deferred.  EXTREMITIES:  No clubbing, no cyanosis.  SKIN:  Intact.  No signs of self-mutilation or remarkable features.  NEUROLOGIC:  Cranial nerves II-XII intact and neurologically nonfocal.   DIAGNOSTIC STUDIES:  Were done here on the unit.  Urine pregnancy test,  urine drug screen and UA are currently pending.  CBC:  WBC 4.5,  hemoglobin 11.4, hematocrit 34.4, platelets 205,000, MCV  87.2.  Chemistry:  Sodium 139, potassium 4.0, chloride 105, carbon dioxide 27,  BUN 5, creatinine 0.72 and random glucose is 83. Hepatic enzymes:  SGOT  16, SGPT 10, alkaline phosphatase 48 and total bilirubin 0.6.  Albumin  is 3.3, and calcium normal at 8.9.   MENTAL STATUS EXAM:  Fully alert female.  She is cooperative.  Pleasant,  silently crying and then at times sobbing. Cried constantly through the  session but is cooperative, engaged in conversation.  Registration is  intact.  Insight is adequate.  Speech is normal in form and production  but rather difficult to understand due to her constant sobbing. Mood is  depressed, hopeless and helpless, mildly agitated at times with the  sobbing. Takes awhile to get herself under control.  Thought process  logical and coherent.  Positive for passive suicidal thoughts today,  promises that she can be safe on the unit.  No active suicidal thoughts.  Had thoughts  yesterday that she might want to end her life by cutting  her wrists.  She is asking for help, willing to take medications to help  calm down and to address her depressive symptoms, willing to have our  social worker talk with her mother and her family and look into other  resources.  She is oriented x4.  No evidence of hallucinations or  internal distractions.  Concentration is adequate. Insight is  adequate.   AXIS I:  1. Major depression not otherwise specified.  2. History of cocaine abuse in remission.  AXIS II:  Deferred.  AXIS III:  No diagnosis.  AXIS IV:  Severe conflict at home and problems with primary support  group, jobless and problems with no income.  AXIS V:  Current 36, past year not known.   PLAN:  To voluntarily admit her to alleviate her suicidal thought.  Her  urine pregnancy test, urine drug screen and UA are currently pending.  We are going to start her on Celexa 10 mg daily and Seroquel 25 mg q.6 h  p.r.n. for agitation with first dose now and will enroll her in our  depressive disorder group and hope to get a family session with her  mother.  Estimated length of stay is 5 days.      Margaret A. Lorin Picket, N.P.      Jasmine Pang, M.D.  Electronically Signed    MAS/MEDQ  D:  08/26/2007  T:  08/27/2007  Job:  [id number]

## 2010-11-24 NOTE — Op Note (Signed)
Yolanda [name], Yolanda [name]             ACCOUNT NO.:  [id number]   MEDICAL RECORD NO.:  [id number]          PATIENT TYPE:  MAT   LOCATION:  MATC                          FACILITY:  [facility]   PHYSICIAN:  Genia Del, M.D.DATE OF BIRTH:  06/15/1976   DATE OF PROCEDURE:  06/09/2004  DATE OF DISCHARGE:  06/09/2004                                 OPERATIVE REPORT   PREOPERATIVE DIAGNOSES:  Left ectopic pregnancy with increased pelvic pain,  methotrexate received x2.   POSTOPERATIVE DIAGNOSES:  Left ectopic pregnancy with increased pelvic pain,  methotrexate received x2.  Hemoperitoneum 500 mL and pelvic adhesions.   INTERVENTION:  Open laparoscopy with left salpingectomy, lysis of adhesions  and evacuation of hemoperitoneum.   SURGEON:  Genia Del, M.D.   ANESTHESIA:  Raul Del, M.D.   DESCRIPTION OF PROCEDURE:  Under general anesthesia with endotracheal  intubation, the patient is in lithotomy position for operative laparoscopy.  She is prepped with Hibiclens on the abdominal suprapubic, vulvar and  vaginal areas and draped as usual. The bladder is emptied. We do a vaginal  exam revealing an anteverted uterus, no masses palpable in the adnexa. We  put the speculum in place, the uterus is cannulated and the speculum is  removed.  We then infiltrate the infraumbilical skin with Marcaine 0.25%, 6  mL.  We make an infraumbilical incision over 1.5 cm with the scalpel and  open the aponeurosis with Mayo scissors. The peritoneum is also opened with  Mayo scissors.  A suture of #0 Vicryl is done in a purse string fashion at  the aponeurosis. We then inserted the Hasson, the pneumoperitoneum is  created with CO2.  We insert the laparoscope and inspect the abdominopelvic  cavity.  In the pelvis, we note hemoperitoneum of about 500 mL. We take  pictures. The uterus is normal in appearance and volume. The right adnexa  presents adhesions between the tube and the ovary and between the  ovary and  the lateral wall.  The fimbria are visible at the distal end of the tube.  On the left side, the tube is dilated with an ampullary ectopic pregnancy.  There is no clear rupture of the tube but it is bleeding at the distal end.  It is very adherent to the left ovary and  the lateral wall. The tube is not  salvageable.  We make a suprapubic incision with the scalpel over 5 mm. We  insert a 5 mm trocar under direct vision. We also insert a 5 mm trocar at  the left iliac area under direct vision.  The Nezhat was inserted, suction  and irrigation of the abdominopelvic cavity is done.  Evidence of PID is  present and adhesions are seen at the level of the liver.  We then use a  tripolar and proceed with left salpingectomy with lysis of adhesions.  We  then use a 5 mm camera and the bag at the infraumbilical trocar to remove  the left tube with the left ectopic pregnancy.  It is sent to pathology. We  then irrigate and suction the pelvic cavity.  Hemostasis  is adequate.  Pictures were taken before and after the procedure. We then remove all  instruments. The CO2 was evacuated. We close the suprapubic and left iliac  incisions with 4-0 Monocryl. We attach the suture at the aponeurosis at the  infraumbilical incision. We also reinforce a small umbilical hernia with a  #0 Vicryl. We close the skin with a subcuticular suture of 4-0 Monocryl.  Hemostasis is adequate at all levels. The  cannula is removed from the uterus. The estimated blood loss was minimal but  a hemoperitoneum of 500 mL was evacuated at the beginning of the surgery.  No complication occurred. The patient was transferred to recovery room in  good status.      ML/MEDQ  D:  06/09/2004  T:  06/09/2004  Job:  [id number]

## 2010-11-24 NOTE — Op Note (Signed)
   NAME:  Yolanda [name], Yolanda [name]                       ACCOUNT NO.:  [id number]   MEDICAL RECORD NO.:  [id number]                   PATIENT TYPE:  MAT   LOCATION:  MATC                                 FACILITY:  [facility]   PHYSICIAN:  Duke Salvia. Marcelle Overlie, M.D.            DATE OF BIRTH:  03/02/1976   DATE OF PROCEDURE:  04/25/2003  DATE OF DISCHARGE:                                 OPERATIVE REPORT   PREOPERATIVE DIAGNOSIS:  Incomplete abortion.   POSTOPERATIVE DIAGNOSIS:  Incomplete abortion.   PROCEDURE:  Dilatation and evacuation.   SURGEON:  Duke Salvia. Marcelle Overlie, M.D.   ANESTHESIA:  Paracervical block plus sedation.   PROCEDURE/FINDINGS:  The patient taken to the operating room.  After an  adequate level of sedation was obtained the legs were placed in stirrups,  the perineum and vagina were prepped and draped in the usual manner for D&E.  The bladder was drained.  EUA carried out.  The uterus was seven weeks'  size, mid position, adnexa negative.  The cervix was grasped with a  tenaculum.  A paracervical block was then created by infiltrating it at 3  and 9 o'clock submucosally.  Five to seven mL of 1% Xylocaine on either side  after negative aspiration.  The uterus was then sounded to 9 cm,  progressively dilated to a 27 Pratt dilator, the cervix was mostly dilated  enough already and minimal bleeding was noted prior to starting.  A #7  curved suction curette was then used to curette a moderate amount of tissue  but no further tissue could be removed.  A small blunt curette was used to  explore the cavity revealing it to be clean.  There was minimal bleeding.  She did receive Ancef 1 gram IV, Pitocin, and also Toradol for postop pain  relief.                                               Richard M. Marcelle Overlie, M.D.    RMH/MEDQ  D:  04/25/2003  T:  04/25/2003  Job:  [id number]

## 2010-11-24 NOTE — H&P (Signed)
   NAME:  Yolanda [name], FEHRENBACH                       ACCOUNT NO.:  [id number]   MEDICAL RECORD NO.:  [id number]                   PATIENT TYPE:  MAT   LOCATION:  MATC                                 [facility]ACILITY:  [facility]   PHYSICIAN:  Duke Salvia. Marcelle Overlie, M.D.            DATE OF BIRTH:  [birth year]   DATE OF ADMISSION:  04/25/2003  DATE OF DISCHARGE:                                HISTORY & PHYSICAL   CHIEF COMPLAINT:  Bleeding, positive UPT.   HISTORY OF PRESENT ILLNESS:  36 year old G3, P2, patient presented to [facility] emergency department complaining of cramping and bleeding.  I was  contacted by the ER physician after their evaluation showed a collapsing  irregular intrauterine sac, quantitative HCG approximately 2400 with stable  hemoglobin although they were giving her bolus fluid for a slightly  decreased blood pressure and some moderate tachycardia.  She was transferred  here for evaluation and D&E after they stabilized her and hydrated her.  She  was also noted on admission there to have a temperature of 100.4, blood  pressure was 109/64, heart rate of 64.  Pulse oximetry was 99% on room air.  Urinalysis was negative.  WBC 7400, hemoglobin 12.1.  She did receive  Morphine and Phenergan for her cramping pain prior to discharge.   PAST MEDICAL HISTORY:   ALLERGIES:  None.   OPERATIONS:  NONE.   OBSTETRICAL HISTORY:  Two vaginal deliveries at term without complication.  Quantitative HCG was 275.   PHYSICAL EXAMINATION:  VITAL SIGNS:  Temperature 100.4, blood pressure  100/60, pulse was 82 on admission to [facility].  HEENT:  Unremarkable.  NECK:  Supple without masses.  LUNGS:  Clear.  CARDIOVASCULAR:  Regular rate and rhythm without murmurs, rubs, or gallops.  BREASTS:  Not examined.  ABDOMEN:  Soft, flat, nontender.  PELVIC EXAMINATION:  By the ER showed a small amount of blood, the cervix  was closed, there was no tissue noted.  Uterus was six to eight weeks size.  Adnexa  unremarkable.   IMPRESSION:  Incomplete abortion.   PLAN:  D&E.  This procedure including risk of bleeding, infection, other  complications that may require additional surgery were all reviewed with the  patient, she understands and accepts.                                               Richard M. Marcelle Overlie, M.D.   RMH/MEDQ  D:  04/25/2003  T:  04/26/2003  Job:  [id number]
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
## 2011-03-09 ENCOUNTER — Emergency Department (HOSPITAL_COMMUNITY)
Admission: EM | Admit: 2011-03-09 | Discharge: 2011-03-12 | Disposition: A | Discharge status: Other Acute Inpatient Hospital | Payer: Medicaid Other | Attending: Emergency Medicine | Admitting: Emergency Medicine

## 2011-03-09 DIAGNOSIS — R45851 Suicidal ideations: Secondary | ICD-10-CM | POA: Insufficient documentation

## 2011-03-09 LAB — COMPREHENSIVE METABOLIC PANEL
ALT: 12 U/L (ref 0–35)
AST: 20 U/L (ref 0–37)
Albumin: 3.6 g/dL (ref 3.5–5.2)
Alkaline Phosphatase: 57 U/L (ref 39–117)
CO2: 25 mEq/L (ref 19–32)
Chloride: 102 mEq/L (ref 96–112)
GFR calc non Af Amer: >60 mL/min (ref >60)
Potassium: 3.7 mEq/L (ref 3.5–5.1)
Sodium: 135 mEq/L (ref 135–145)
Total Bilirubin: 0.5 mg/dL (ref 0.3–1.2)

## 2011-03-09 LAB — DIFFERENTIAL
Basophils Absolute: 0 10*3/uL (ref 0.0–0.1)
Eosinophils Absolute: 0.1 10*3/uL (ref 0.0–0.7)
Lymphs Abs: 2.3 10*3/uL (ref 0.7–4.0)
Neutrophils Relative %: 73 % (ref 43–77)

## 2011-03-09 LAB — CBC
MCV: 87.3 fL (ref 78.0–100.0)
Platelets: 202 10*3/uL (ref 150–400)
RBC: 3.93 MIL/uL (ref 3.87–5.11)
WBC: 11 10*3/uL — ABNORMAL HIGH (ref 4.0–10.5)

## 2011-03-10 LAB — RAPID URINE DRUG SCREEN, HOSP PERFORMED
Amphetamines: NOT DETECTED
Barbiturates: NOT DETECTED
Tetrahydrocannabinol: NOT DETECTED

## 2011-03-12 ENCOUNTER — Inpatient Hospital Stay (HOSPITAL_COMMUNITY)
Admission: AD | Admit: 2011-03-12 | Discharge: 2011-03-24 | DRG: 781 | Disposition: A | Discharge status: Home / Self Care | Payer: PRIVATE HEALTH INSURANCE | Attending: Psychiatry | Admitting: Psychiatry

## 2011-03-12 DIAGNOSIS — Z111 Encounter for screening for respiratory tuberculosis: Secondary | ICD-10-CM

## 2011-03-12 DIAGNOSIS — O99340 Other mental disorders complicating pregnancy, unspecified trimester: Principal | ICD-10-CM

## 2011-03-12 DIAGNOSIS — IMO0002 Reserved for concepts with insufficient information to code with codable children: Secondary | ICD-10-CM

## 2011-03-12 DIAGNOSIS — Z733 Stress, not elsewhere classified: Secondary | ICD-10-CM

## 2011-03-12 DIAGNOSIS — F39 Unspecified mood [affective] disorder: Secondary | ICD-10-CM

## 2011-03-12 DIAGNOSIS — R45851 Suicidal ideations: Secondary | ICD-10-CM

## 2011-03-12 DIAGNOSIS — N390 Urinary tract infection, site not specified: Secondary | ICD-10-CM

## 2011-03-13 DIAGNOSIS — F329 Major depressive disorder, single episode, unspecified: Secondary | ICD-10-CM

## 2011-03-13 LAB — URINALYSIS, ROUTINE W REFLEX MICROSCOPIC
Bilirubin Urine: NEGATIVE
Glucose, UA: NEGATIVE mg/dL
Nitrite: NEGATIVE
Specific Gravity, Urine: 1.024 (ref 1.005–1.030)
pH: 5.5 (ref 5.0–8.0)

## 2011-03-13 LAB — URINE MICROSCOPIC-ADD ON

## 2011-03-13 LAB — LIPID PANEL
HDL: 73 mg/dL (ref >39)
Triglycerides: 82 mg/dL (ref <150)

## 2011-03-14 LAB — DRUGS OF ABUSE SCREEN W/O ALC, ROUTINE URINE
Benzodiazepines.: NEGATIVE
Marijuana Metabolite: NEGATIVE
Opiate Screen, Urine: NEGATIVE
Phencyclidine (PCP): NEGATIVE
Propoxyphene: NEGATIVE
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
## 2011-03-24 ENCOUNTER — Inpatient Hospital Stay (EMERGENCY_DEPARTMENT_HOSPITAL)
Admission: AD | Admit: 2011-03-24 | Discharge: 2011-03-25 | Disposition: A | Discharge status: Other Acute Inpatient Hospital | Payer: Medicaid Other | Source: Ambulatory Visit | Attending: Obstetrics and Gynecology | Admitting: Obstetrics and Gynecology

## 2011-03-24 ENCOUNTER — Encounter (HOSPITAL_COMMUNITY): Payer: Self-pay | Admitting: Obstetrics and Gynecology

## 2011-03-24 ENCOUNTER — Inpatient Hospital Stay (HOSPITAL_COMMUNITY): Payer: Medicaid Other

## 2011-03-24 DIAGNOSIS — N390 Urinary tract infection, site not specified: Secondary | ICD-10-CM

## 2011-03-24 DIAGNOSIS — Z331 Pregnant state, incidental: Secondary | ICD-10-CM

## 2011-03-24 DIAGNOSIS — IMO0002 Reserved for concepts with insufficient information to code with codable children: Secondary | ICD-10-CM

## 2011-03-24 DIAGNOSIS — O209 Hemorrhage in early pregnancy, unspecified: Secondary | ICD-10-CM | POA: Insufficient documentation

## 2011-03-24 DIAGNOSIS — O2390 Unspecified genitourinary tract infection in pregnancy, unspecified trimester: Secondary | ICD-10-CM | POA: Insufficient documentation

## 2011-03-24 DIAGNOSIS — R6250 Unspecified lack of expected normal physiological development in childhood: Secondary | ICD-10-CM | POA: Insufficient documentation

## 2011-03-24 HISTORY — DX: Major depressive disorder, single episode, unspecified: F32.9

## 2011-03-24 HISTORY — DX: Depression, unspecified: F32.A

## 2011-03-24 LAB — URINALYSIS, ROUTINE W REFLEX MICROSCOPIC
Bilirubin Urine: NEGATIVE
Glucose, UA: NEGATIVE mg/dL
Protein, ur: NEGATIVE mg/dL
Specific Gravity, Urine: 1.024 (ref 1.005–1.030)
Urobilinogen, UA: 1 mg/dL (ref 0.0–1.0)
Urobilinogen, UA: 0.2 mg/dL (ref 0.0–1.0)

## 2011-03-24 LAB — URINE MICROSCOPIC-ADD ON

## 2011-03-24 MED ORDER — CEFTRIAXONE SODIUM 250 MG IJ SOLR
250.0000 mg | Freq: Once | INTRAMUSCULAR | Status: AC
  Administered 2011-03-24: 250 mg via INTRAMUSCULAR
  Filled 2011-03-24: qty 250

## 2011-03-24 MED ORDER — METRONIDAZOLE 500 MG PO TABS
2000.0000 mg | ORAL_TABLET | Freq: Once | ORAL | Status: AC
  Administered 2011-03-24: 2000 mg via ORAL
  Filled 2011-03-24: qty 4

## 2011-03-24 MED ORDER — NITROFURANTOIN MONOHYD MACRO 100 MG PO CAPS: 100.0000 mg | ORAL_CAPSULE | Freq: Two times a day (BID) | ORAL | Status: AC

## 2011-03-24 MED ORDER — AZITHROMYCIN 1 G PO PACK
1.0000 g | PACK | Freq: Once | ORAL | Status: AC
  Administered 2011-03-24: 1 g via ORAL
  Filled 2011-03-24: qty 1

## 2011-03-24 MED ORDER — ONDANSETRON HCL 4 MG PO TABS
4.0000 mg | ORAL_TABLET | Freq: Once | ORAL | Status: AC
  Administered 2011-03-24: 4 mg via ORAL
  Filled 2011-03-24: qty 1

## 2011-03-24 MED ORDER — PHENAZOPYRIDINE HCL 200 MG PO TABS: 200.0000 mg | ORAL_TABLET | Freq: Three times a day (TID) | ORAL | Status: AC | PRN
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
## 2011-03-25 ENCOUNTER — Inpatient Hospital Stay (HOSPITAL_COMMUNITY)
Admission: EM | Admit: 2011-03-25 | Discharge: 2011-03-29 | DRG: 781 | Disposition: A | Discharge status: Home / Self Care | Payer: Medicaid Other | Source: Other Acute Inpatient Hospital | Attending: Psychiatry | Admitting: Psychiatry

## 2011-03-25 DIAGNOSIS — F29 Unspecified psychosis not due to a substance or known physiological condition: Secondary | ICD-10-CM

## 2011-03-25 DIAGNOSIS — Z733 Stress, not elsewhere classified: Secondary | ICD-10-CM

## 2011-03-25 DIAGNOSIS — N390 Urinary tract infection, site not specified: Secondary | ICD-10-CM

## 2011-03-25 DIAGNOSIS — F39 Unspecified mood [affective] disorder: Secondary | ICD-10-CM

## 2011-03-25 DIAGNOSIS — IMO0002 Reserved for concepts with insufficient information to code with codable children: Secondary | ICD-10-CM

## 2011-03-25 DIAGNOSIS — O99340 Other mental disorders complicating pregnancy, unspecified trimester: Principal | ICD-10-CM

## 2011-03-25 DIAGNOSIS — Z111 Encounter for screening for respiratory tuberculosis: Secondary | ICD-10-CM

## 2011-03-25 DIAGNOSIS — R45851 Suicidal ideations: Secondary | ICD-10-CM

## 2011-03-25 LAB — HIV ANTIBODY (ROUTINE TESTING W REFLEX): HIV: NONREACTIVE

## 2011-03-25 LAB — SYPHILIS: RPR W/REFLEX TO RPR TITER AND TREPONEMAL ANTIBODIES, TRADITIONAL SCREENING AND DIAGNOSIS ALGORITHM: RPR Ser Ql: NONREACTIVE

## 2011-03-25 MED ORDER — ONDANSETRON 4 MG PO TBDP
4.0000 mg | ORAL_TABLET | Freq: Once | ORAL | Status: AC
  Administered 2011-03-25: 4 mg via ORAL
  Filled 2011-03-25: qty 1

## 2011-03-25 MED ORDER — ONDANSETRON HCL 4 MG PO TABS: 4.0000 mg | ORAL_TABLET | Freq: Once | ORAL | Status: DC

## 2011-03-26 LAB — GC/CHLAMYDIA PROBE AMP, GENITAL: GC Probe Amp, Genital: NEGATIVE

## 2011-03-26 NOTE — ED Provider Notes (Signed)
Agree with above note.  Yolanda [name] 03/26/2011 4:27 PM

## 2011-03-29 LAB — GC/CHLAMYDIA PROBE AMP, GENITAL
Chlamydia, DNA Probe: NEGATIVE
GC Probe Amp, Genital: NEGATIVE

## 2011-03-29 LAB — POCT URINALYSIS DIP (DEVICE)
Protein, ur: 100 — AB
Specific Gravity, Urine: 1.02
Urobilinogen, UA: 2 — ABNORMAL HIGH
pH: 6.5

## 2011-03-30 LAB — COMPREHENSIVE METABOLIC PANEL
AST: 16
BUN: 5 — ABNORMAL LOW
CO2: 27
Calcium: 8.9
Chloride: 105
Creatinine, Ser: 0.72
GFR calc Af Amer: >60
GFR calc non Af Amer: >60
Total Bilirubin: 0.6

## 2011-03-30 LAB — TSH: TSH: 0.914

## 2011-03-30 LAB — DRUGS OF ABUSE SCREEN W/O ALC, ROUTINE URINE
Amphetamine Screen, Ur: NEGATIVE
Marijuana Metabolite: NEGATIVE
Opiate Screen, Urine: NEGATIVE
Propoxyphene: NEGATIVE

## 2011-03-30 LAB — URINALYSIS, ROUTINE W REFLEX MICROSCOPIC
Bilirubin Urine: NEGATIVE
Nitrite: NEGATIVE
Protein, ur: NEGATIVE
Specific Gravity, Urine: 1.01
Urobilinogen, UA: 0.2

## 2011-03-30 LAB — CBC
HCT: 34.4 — ABNORMAL LOW
MCHC: 33.3
MCV: 87.2
RBC: 3.94

## 2011-03-30 LAB — URINE MICROSCOPIC-ADD ON

## 2011-03-30 LAB — DIFFERENTIAL
Basophils Absolute: 0
Eosinophils Relative: 5
Lymphocytes Relative: 40
Lymphs Abs: 1.8
Neutro Abs: 2.1
Neutrophils Relative %: 46

## 2011-03-30 LAB — PREGNANCY, URINE: Preg Test, Ur: NEGATIVE

## 2011-04-10 LAB — URINALYSIS, ROUTINE W REFLEX MICROSCOPIC
Bilirubin Urine: NEGATIVE
Nitrite: NEGATIVE
Specific Gravity, Urine: 1.03
Urobilinogen, UA: 1
pH: 6

## 2011-04-10 LAB — URINE MICROSCOPIC-ADD ON

## 2011-04-10 LAB — WET PREP, GENITAL: Yeast Wet Prep HPF POC: NONE SEEN

## 2011-04-10 LAB — GC/CHLAMYDIA PROBE AMP, GENITAL
Chlamydia, DNA Probe: NEGATIVE
GC Probe Amp, Genital: NEGATIVE
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
## 2011-10-16 ENCOUNTER — Encounter (HOSPITAL_COMMUNITY): Payer: Self-pay | Admitting: Emergency Medicine

## 2011-10-16 ENCOUNTER — Emergency Department (HOSPITAL_COMMUNITY)
Admission: EM | Admit: 2011-10-16 | Discharge: 2011-10-19 | Disposition: A | Discharge status: Home / Self Care | Payer: Medicaid Other | Attending: Emergency Medicine | Admitting: Emergency Medicine

## 2011-10-16 DIAGNOSIS — F3130 Bipolar disorder, current episode depressed, mild or moderate severity, unspecified: Secondary | ICD-10-CM | POA: Insufficient documentation

## 2011-10-16 DIAGNOSIS — R51 Headache: Secondary | ICD-10-CM | POA: Insufficient documentation

## 2011-10-16 DIAGNOSIS — R45851 Suicidal ideations: Secondary | ICD-10-CM | POA: Insufficient documentation

## 2011-10-16 DIAGNOSIS — N898 Other specified noninflammatory disorders of vagina: Secondary | ICD-10-CM | POA: Insufficient documentation

## 2011-10-16 DIAGNOSIS — F319 Bipolar disorder, unspecified: Secondary | ICD-10-CM

## 2011-10-16 DIAGNOSIS — F329 Major depressive disorder, single episode, unspecified: Secondary | ICD-10-CM

## 2011-10-16 LAB — DIFFERENTIAL
Lymphs Abs: 3.2 10*3/uL (ref 0.7–4.0)
Monocytes Absolute: 0.4 10*3/uL (ref 0.1–1.0)
Monocytes Relative: 5 % (ref 3–12)
Neutro Abs: 4.2 10*3/uL (ref 1.7–7.7)
Neutrophils Relative %: 52 % (ref 43–77)

## 2011-10-16 LAB — RAPID URINE DRUG SCREEN, HOSP PERFORMED
Barbiturates: NOT DETECTED
Cocaine: POSITIVE — AB
Opiates: NOT DETECTED
Tetrahydrocannabinol: POSITIVE — AB

## 2011-10-16 LAB — CBC
HCT: 37 % (ref 36.0–46.0)
Hemoglobin: 12.2 g/dL (ref 12.0–15.0)
MCH: 29 pg (ref 26.0–34.0)
RBC: 4.21 MIL/uL (ref 3.87–5.11)

## 2011-10-16 LAB — BASIC METABOLIC PANEL
BUN: 8 mg/dL (ref 6–23)
Chloride: 103 mEq/L (ref 96–112)
Creatinine, Ser: 0.95 mg/dL (ref 0.50–1.10)
Glucose, Bld: 82 mg/dL (ref 70–99)
Potassium: 3.5 mEq/L (ref 3.5–5.1)

## 2011-10-16 LAB — WET PREP, GENITAL: Trich, Wet Prep: NONE SEEN

## 2011-10-16 MED ORDER — ACETAMINOPHEN 325 MG PO TABS: 650.0000 mg | ORAL_TABLET | ORAL | Status: DC | PRN

## 2011-10-16 MED ORDER — NICOTINE 21 MG/24HR TD PT24
21.0000 mg | MEDICATED_PATCH | Freq: Every day | TRANSDERMAL | Status: DC
  Filled 2011-10-16: qty 1

## 2011-10-16 MED ORDER — IBUPROFEN 200 MG PO TABS: 600.0000 mg | ORAL_TABLET | Freq: Three times a day (TID) | ORAL | Status: DC | PRN

## 2011-10-16 MED ORDER — LIDOCAINE HCL (PF) 1 % IJ SOLN
INTRAMUSCULAR | Status: AC
  Administered 2011-10-16: 1.8 mL
  Filled 2011-10-16: qty 5

## 2011-10-16 MED ORDER — ZOLPIDEM TARTRATE 5 MG PO TABS: 5.0000 mg | ORAL_TABLET | Freq: Every evening | ORAL | Status: DC | PRN

## 2011-10-16 MED ORDER — ONDANSETRON HCL 8 MG PO TABS: 4.0000 mg | ORAL_TABLET | Freq: Three times a day (TID) | ORAL | Status: DC | PRN

## 2011-10-16 MED ORDER — CEFTRIAXONE SODIUM 250 MG IJ SOLR
250.0000 mg | Freq: Once | INTRAMUSCULAR | Status: AC
  Administered 2011-10-16: 250 mg via INTRAMUSCULAR
  Filled 2011-10-16: qty 250

## 2011-10-16 MED ORDER — ALUM & MAG HYDROXIDE-SIMETH 200-200-20 MG/5ML PO SUSP
30.0000 mL | ORAL | Status: DC | PRN
  Administered 2011-10-18: 30 mL via ORAL
  Filled 2011-10-16: qty 30

## 2011-10-16 MED ORDER — LORAZEPAM 1 MG PO TABS
1.0000 mg | ORAL_TABLET | Freq: Three times a day (TID) | ORAL | Status: DC | PRN
  Filled 2011-10-16: qty 1

## 2011-10-16 MED ORDER — ZIPRASIDONE HCL 20 MG PO CAPS
20.0000 mg | ORAL_CAPSULE | Freq: Two times a day (BID) | ORAL | Status: DC
  Administered 2011-10-16 – 2011-10-19 (×6): 20 mg via ORAL
  Filled 2011-10-16 (×7): qty 1

## 2011-10-16 MED ORDER — AZITHROMYCIN 250 MG PO TABS
1000.0000 mg | ORAL_TABLET | Freq: Once | ORAL | Status: AC
  Administered 2011-10-16: 1000 mg via ORAL
  Filled 2011-10-16: qty 4

## 2011-10-16 MED ORDER — MIRTAZAPINE 15 MG PO TABS
15.0000 mg | ORAL_TABLET | Freq: Every day | ORAL | Status: DC
  Administered 2011-10-16 – 2011-10-18 (×3): 15 mg via ORAL
  Filled 2011-10-16 (×3): qty 1

## 2011-10-16 NOTE — ED Notes (Signed)
Placed in room 31 for telepsych consult

## 2011-10-16 NOTE — ED Notes (Signed)
RN spoke with EDP regarding patient's complaints/requests. MD will go and see the patient shortly.

## 2011-10-16 NOTE — ED Notes (Signed)
Patient provided with [place] sandwhich, graham crackers, pretzels, peanut butter crackers, cheese stick, applesauce and sprite.

## 2011-10-16 NOTE — ED Notes (Signed)
PT. WEARING PAPER SCRUBS AT TRIAGE , SECURITY PAGED TO WAND PT. AC / CHARGE NURSE NOTIFIED FOR SITTER.
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
## 2011-10-16 NOTE — ED Notes (Signed)
Patient resting quietly, calm with unlabored respirations.  Patient just received meal tray.

## 2011-10-16 NOTE — ED Provider Notes (Signed)
Evaluated by the psychiatrist specialist on-call Dr Berlin Hun. Patient requires inpatient care under IVC paperwork. They recommended Remeron 15 mg at bedtime. Geodon 20 mg by mouth twice a day. These orders were placed.  Dayton Bailiff, MD 10/16/11 1531

## 2011-10-16 NOTE — ED Notes (Signed)
Pt resting, no needs at this time; family at bedside 

## 2011-10-16 NOTE — ED Notes (Signed)
Marcus with [facility] will come and speak to patient shortly.

## 2011-10-16 NOTE — BH Assessment (Signed)
Assessment Note   Yolanda [name] is an 36 y.o. female that presented to [facility] last night due to complaints of abdominal pain and depression (per boyfriend).  Per pt and boyfriend, pt has been having increased depression.  Upon admission to ED, pt denied SI.  When pt assessed this day, pt would not answer most questions.  When writer asked if she had SI, she shrugged her shoulders and went back to sleep, stating she was sorry, but she was tired.  Pt did deny HI or psychosis.  [name] UDS was positive for THC and cocaine.  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  Pt has been admitted to [facility] and [facility] in the past for SI per her history.  Consulted with ED Steinl who agreed telepsych assessment warranted for further evaluation.  Completed assessment, assessment notification and faxed to [facility] to log.  Updated ED staff.  Pt pending telepsych.  Axis I: Mood Disorder NOS and Substance Abuse Axis II: Deferred Axis III:  Past Medical History  Diagnosis Date  . Depression    Axis IV: other psychosocial or environmental problems and problems related to social environment Axis V: 31-40 impairment in reality testing  Past Medical History:  Past Medical History  Diagnosis Date  . Depression     Past Surgical History  Procedure Date  . Salpingectomy   . No past surgeries     Family History: No family history on file.  Social History:  reports that she has been smoking.  She does not have any smokeless tobacco history on file. She reports that she drinks alcohol. She reports that she uses illicit drugs (Cocaine and Marijuana).  Additional Social History:  Alcohol / Drug Use Pain Medications: none Prescriptions: none Over the Counter: none History of alcohol / drug use?: Yes Substance #1 Name of Substance 1: Cocaine 1 - Age of First Use: unknown 1 - Amount (size/oz): unknown 1 - Frequency: unknown 1 - Duration: unknown 1 - Last Use / Amount: unknown Substance  #2 Name of Substance 2: THC 2 - Age of First Use: unknown 2 - Amount (size/oz): unknown 2 - Frequency: unknown 2 - Duration: unknown 2 - Last Use / Amount: unknown Allergies: No Known Allergies  Home Medications:  Medications Prior to Admission  Medication Dose Route Frequency Provider Last Rate Last Dose  . acetaminophen (TYLENOL) tablet 650 mg  650 mg Oral Q4H PRN Vida Roller, MD      . alum & mag hydroxide-simeth (MAALOX/MYLANTA) 200-200-20 MG/5ML suspension 30 mL  30 mL Oral PRN Vida Roller, MD      . azithromycin [facility]) tablet 1,000 mg  1,000 mg Oral Once Vida Roller, MD   1,000 mg at 10/16/11 0641  . cefTRIAXone (ROCEPHIN) injection 250 mg  250 mg Intramuscular Once Vida Roller, MD   250 mg at 10/16/11 0641  . ibuprofen (ADVIL,MOTRIN) tablet 600 mg  600 mg Oral Q8H PRN Vida Roller, MD      . lidocaine (XYLOCAINE) 1 % injection        1.8 mL at 10/16/11 0642  . LORazepam (ATIVAN) tablet 1 mg  1 mg Oral Q8H PRN Vida Roller, MD      . nicotine (NICODERM CQ - dosed in mg/24 hours) patch 21 mg  21 mg Transdermal Daily Vida Roller, MD      . ondansetron [facility]) tablet 4 mg  4 mg Oral Q8H PRN Arlys John  Alanda Amass, MD      . zolpidem Rem[facility] Loffler) tablet 5 mg  5 mg Oral QHS PRN Vida Roller, MD       No current outpatient prescriptions on file as of 10/16/2011.    OB/GYN Status:  Patient's last menstrual period was 10/15/2011.  General Assessment Data Location of Assessment: [facility] ED Living Arrangements: Other (Comment) (pt stated lives in a crack house) Can pt return to current living arrangement?: Yes Admission Status: Voluntary Is patient capable of signing voluntary admission?: Yes Transfer from: Acute Hospital Referral Source: Self/Family/Friend  Education Status Is patient currently in school?: No Current Grade: na Highest grade of school patient has completed: na Name of school: na Contact person: na  Risk to self Suicidal Ideation: No (pt shrugged  shoulders, did not answer) Suicidal Intent: No Is patient at risk for suicide?:  (unknown) Suicidal Plan?: No Access to Means: No What has been your use of drugs/alcohol within the last 12 months?: positive UDS for THC, cocaine Previous Attempts/Gestures:  (unknown) How many times?:  (unknown) Other Self Harm Risks:  (unknown) Triggers for Past Attempts: Unknown Intentional Self Injurious Behavior:  (unknown) Family Suicide History: Unable to assess Recent stressful life event(s): 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) Persecutory voices/beliefs?:  (UTA) Depression:  (UTA) Depression Symptoms:  (UTA) Substance abuse history and/or treatment for substance abuse?: No Suicide prevention information given to non-admitted patients: Not applicable  Risk to Others Homicidal Ideation: No Thoughts of Harm to Others: No Current Homicidal Intent: No Current Homicidal Plan: No Access to Homicidal Means: No Identified Victim: na History of harm to others?:  (UTA) Assessment of Violence: None Noted Violent Behavior Description: UTA Does patient have access to weapons?:  (UTA) Criminal Charges Pending?:  (UTA) Does patient have a court date:  (UTA)  Psychosis Hallucinations: None noted Delusions: None noted  Mental Status Report Appear/Hygiene: Disheveled Eye Contact: Poor Motor Activity: Unable to assess Speech: Slurred Level of Consciousness: Quiet/awake Mood: Other (Comment) (UTA) Affect: Unable to Assess Anxiety Level:  (UTA) Thought Processes: Coherent;Relevant Judgement: Impaired Orientation: Unable to assess Obsessive Compulsive Thoughts/Behaviors:  (UTA)  Cognitive Functioning Concentration:  (UTA) Memory:  (UTA) IQ: Below Average Level of Function: BIF Insight:  (UTA) Impulse Control:  (UTA) Appetite:  (UTA) Weight Loss:  (unknown) Weight Gain:  (unknown) Sleep:  (UTA) Total Hours of Sleep:  (UTA) Vegetative Symptoms:  (UTA)  Prior  Inpatient Therapy Prior Inpatient Therapy: Yes Prior Therapy Dates: 2009, 2010, 2011, 2012 Prior Therapy Facilty/Provider(s): [facility] Reason for Treatment: SI  Prior Outpatient Therapy Prior Outpatient Therapy:  (unknown) Prior Therapy Dates: unknown Prior Therapy Facilty/Provider(s): unknown Reason for Treatment: unknown          Abuse/Neglect Assessment (Assessment to be complete while patient is alone) Physical Abuse:  (pt did not answer) Verbal Abuse:  (pt did not answer) Sexual Abuse:  (pt did not answer) Exploitation of patient/patient's resources:  (pt did not answer) Self-Neglect:  (pt did not answer) Values / Beliefs Cultural Requests During Hospitalization: None Spiritual Requests During Hospitalization: None   Advance Directives (For Healthcare) Advance Directive:  (unknown)    Additional Information 1:1 In Past 12 Months?: No CIRT Risk: No Elopement Risk: No Does patient have medical clearance?: Yes     Disposition:  Disposition Disposition of Patient: Other dispositions (Pending telepsych) Other disposition(s): Other (Comment) (Pending telepsych)  On Site Evaluation by:   Reviewed with Physician:  Valene Bors, Rennis Harding 10/16/2011 12:27  PM

## 2011-10-16 NOTE — ED Notes (Signed)
Patient resting quietly, calm with unlabored respirations.  

## 2011-10-16 NOTE — ED Notes (Signed)
Pt states that she just wants to sleep.

## 2011-10-16 NOTE — ED Notes (Signed)
Patient sleeping.   Unlabored respirations.

## 2011-10-16 NOTE — ED Notes (Signed)
PT. REPORTS CHILLS WITH NAUSEA ONSET TODAY , POOR HISTORIAN AT TRIAGE  ,  BOYFRIEND  STATES SHE HAS BEEN DEPRESSED FOR SEVERAL DAYS , PT. DENIES SUICIDAL IDEATION .

## 2011-10-16 NOTE — ED Notes (Signed)
Patient resting quietly, calm with unlabored respirations.  Patient ambulated to restroom and tolerated well.

## 2011-10-16 NOTE — ED Notes (Signed)
Patient received meal tray 

## 2011-10-17 NOTE — ED Notes (Addendum)
Pt awake and took geodon.  Pt requested and was given cheese, crackers and sprite. Pt states she doesn't want to hurt herself at this time. Pt ambulated to restroom

## 2011-10-17 NOTE — BH Assessment (Addendum)
Assessment Note   Yolanda [name] is an 36 y.o. female. Reassessment of pt awaiting inpt placement for depression/SI per telepsych recommendation.  Pt was very difficult to understand but did provide more information.  Pt admits to depression currently but will only identify that "I have done things I am not proud of" as the stressors that are causing depression.  Pt denies SI at this time, also denies HI/AV.  Pt does admit to history of depression with multiple prior hospitalizations.   Pt denies any alcohol or drug use.  Axis I: Mood Disorder NOS Axis II: Deferred Axis III:  Past Medical History  Diagnosis Date  . Depression    Axis IV: unknown Axis V: 31-40 impairment in reality testing  Past Medical History:  Past Medical History  Diagnosis Date  . Depression     Past Surgical History  Procedure Date  . Salpingectomy   . No past surgeries     Family History: No family history on file.  Social History:  reports that she has been smoking.  She does not have any smokeless tobacco history on file. She reports that she drinks alcohol. She reports that she uses illicit drugs (Cocaine and Marijuana).  Additional Social History:  Alcohol / Drug Use Pain Medications: none Prescriptions: none Over the Counter: none History of alcohol / drug use?: No history of alcohol / drug abuse Substance #1 Name of Substance 1: Cocaine 1 - Age of First Use: unknown 1 - Amount (size/oz): unknown 1 - Frequency: unknown 1 - Duration: unknown 1 - Last Use / Amount: unknown Substance #2 Name of Substance 2: THC 2 - Age of First Use: unknown 2 - Amount (size/oz): unknown 2 - Frequency: unknown 2 - Duration: unknown 2 - Last Use / Amount: unknown Allergies: No Known Allergies  Home Medications:  Medications Prior to Admission  Medication Dose Route Frequency Provider Last Rate Last Dose  . acetaminophen (TYLENOL) tablet 650 mg  650 mg Oral Q4H PRN Vida Roller, MD      . alum & mag  hydroxide-simeth (MAALOX/MYLANTA) 200-200-20 MG/5ML suspension 30 mL  30 mL Oral PRN Vida Roller, MD      . azithromycin [facility]) tablet 1,000 mg  1,000 mg Oral Once Vida Roller, MD   1,000 mg at 10/16/11 0641  . cefTRIAXone (ROCEPHIN) injection 250 mg  250 mg Intramuscular Once Vida Roller, MD   250 mg at 10/16/11 0641  . ibuprofen (ADVIL,MOTRIN) tablet 600 mg  600 mg Oral Q8H PRN Vida Roller, MD      . lidocaine (XYLOCAINE) 1 % injection        1.8 mL at 10/16/11 0642  . LORazepam (ATIVAN) tablet 1 mg  1 mg Oral Q8H PRN Vida Roller, MD      . mirtazapine (REMERON) tablet 15 mg  15 mg Oral QHS Dayton Bailiff, MD   15 mg at 10/16/11 2206  . nicotine (NICODERM CQ - dosed in mg/24 hours) patch 21 mg  21 mg Transdermal Daily Vida Roller, MD      . ondansetron [facility]) tablet 4 mg  4 mg Oral Q8H PRN Vida Roller, MD      . ziprasidone (GEODON) capsule 20 mg  20 mg Oral BID WC Dayton Bailiff, MD   20 mg at 10/17/11 1728  . zolpidem (AMBIEN) tablet 5 mg  5 mg Oral QHS PRN Vida Roller, MD       No current  outpatient prescriptions on file as of 10/16/2011.    OB/GYN Status:  Patient's last menstrual period was 10/15/2011.  General Assessment Data Location of Assessment: [facility] ED ACT Assessment: Yes Living Arrangements: Parent;Children Can pt return to current living arrangement?: Yes Admission Status: Voluntary Is patient capable of signing voluntary admission?: Yes Transfer from: Acute Hospital Referral Source: Self/Family/Friend  Education Status Is patient currently in school?: No Current Grade: na Highest grade of school patient has completed: na Name of school: na Contact person: na  Risk to self Suicidal Ideation: No-Not Currently/Within Last 6 Months Suicidal Intent: No Is patient at risk for suicide?: Yes Suicidal Plan?: No Access to Means:  (unknown) What has been your use of drugs/alcohol within the last 12 months?: pt denied use upon this reassessment, chart  does report history of cocain/thc use Previous Attempts/Gestures: Yes How many times?: 3  Other Self Harm Risks:  (unknown) Triggers for Past Attempts: Unknown Intentional Self Injurious Behavior: None Family Suicide History: No Recent stressful life event(s): Other (Comment) (Pt would only say "I have done things I'm not proud of.") Persecutory voices/beliefs?: No Depression: Yes Depression Symptoms: Tearfulness;Isolating;Fatigue;Guilt;Loss of interest in usual pleasures;Feeling worthless/self pity;Feeling angry/irritable Substance abuse history and/or treatment for substance abuse?: No (pt denies) Suicide prevention information given to non-admitted patients: Not applicable  Risk to Others Homicidal Ideation: No Thoughts of Harm to Others: No Current Homicidal Intent: No Current Homicidal Plan: No Access to Homicidal Means: No Identified Victim: na History of harm to others?: No Assessment of Violence: None Noted Violent Behavior Description: UTA Does patient have access to weapons?: No Criminal Charges Pending?: No Does patient have a court date: No  Psychosis Hallucinations: None noted Delusions: None noted  Mental Status Report Appear/Hygiene: Disheveled Eye Contact: Good Motor Activity: Unremarkable Speech: Other (Comment) (Pt very hard to understand-speech impediment?) Level of Consciousness: Alert Mood: Depressed Affect: Depressed Anxiety Level: Minimal Thought Processes: Relevant Judgement: Unimpaired Orientation: Person;Place;Time;Situation Obsessive Compulsive Thoughts/Behaviors: None  Cognitive Functioning Concentration:  (UTA) Memory: Recent Intact;Remote Intact IQ: Below Average Level of Function: BIF Insight: Fair Impulse Control:  (UTA) Appetite: Good Weight Loss: 0  Weight Gain: 5  Sleep: No Change Total Hours of Sleep: 5  Vegetative Symptoms: None  Prior Inpatient Therapy Prior Inpatient Therapy: Yes Prior Therapy Dates: 2009, 2010,  2011, 2012 Prior Therapy Facilty/Provider(s): [facility] Reason for Treatment: SI  Prior Outpatient Therapy Prior Outpatient Therapy: No Prior Therapy Dates: unknown Prior Therapy Facilty/Provider(s): unknown Reason for Treatment: unknown          Abuse/Neglect Assessment (Assessment to be complete while patient is alone) Physical Abuse:  (pt did not answer) Verbal Abuse:  (pt did not answer) Sexual Abuse:  (pt did not answer) Exploitation of patient/patient's resources:  (pt did not answer) Self-Neglect:  (pt did not answer) Values / Beliefs Cultural Requests During Hospitalization: None Spiritual Requests During Hospitalization: None   Advance Directives (For Healthcare) Advance Directive:  (unknown)    Additional Information 1:1 In Past 12 Months?: No CIRT Risk: No Elopement Risk: No Does patient have medical clearance?: Yes     Disposition: Pt awaiting placement.  [facility] contacted and they have declined pt, stating they don't have an appropriate bed. Disposition Disposition of Patient: Inpatient treatment program Type of inpatient treatment program: Adult Other disposition(s): Referred to outside facility  On Site Evaluation by:   Reviewed with Physician:     Yolanda [name] 10/17/2011 9:18 PM

## 2011-10-17 NOTE — ED Provider Notes (Signed)
Filed Vitals:   10/17/11 0645  BP: 92/65  Pulse: 67  Temp: 98.1 F (36.7 C)  Resp: 16   Remained stable. Awaiting placement at this time.   Cyndra Numbers, MD 10/17/11 [phone]

## 2011-10-17 NOTE — ED Notes (Signed)
Pt ambulated to bathroom. Back in bed rails ups, Pt given sprite

## 2011-10-18 NOTE — ED Notes (Signed)
Dinner tray ordered, regular nonsharp 

## 2011-10-18 NOTE — ED Notes (Signed)
Dinner tray delivered.

## 2011-10-18 NOTE — ED Notes (Signed)
Pt resting with eyes closed at this time.

## 2011-10-18 NOTE — ED Provider Notes (Signed)
BP 92/55  Pulse 71  Temp(Src) 97.3 F (36.3 C) (Oral)  Resp 14  SpO2 98%  LMP 10/15/2011 Pt stable at this time No complaints Awaiting placement (ACT reports she is waiting on [facility])   Joya Gaskins, MD 10/18/11 1559

## 2011-10-19 MED ORDER — MIRTAZAPINE 15 MG PO TABS: 15.0000 mg | ORAL_TABLET | Freq: Every day | ORAL | Status: DC

## 2011-10-19 NOTE — ED Notes (Signed)
Patient sleeping with NAD at this time. 

## 2011-10-19 NOTE — Discharge Instructions (Signed)
Borderline Personality Disorder Borderline personality disorder is a mental health disorder. People with borderline personality disorder have unhealthy patterns of perceiving, thinking about, and reacting to their environment and events in their life. These patterns are established by adolescence or early adulthood. People with borderline personality disorder also have difficulty coping with stress on their own and fear being abandoned by others. They have difficulty controlling their emotions. Their emotions change quickly, frequently, and intensely. They are easily upset and can become very angry, very suddenly. Their unpredictable behavior often leads to problems in their relationships. They often feel worthless, unloved, and emotionally empty. CAUSES No one knows the exact cause of borderline personality disorder. Most mental health experts think that there is more than one cause. Possible contributing factors include:  Genetic factors. These are traits that are passed down from one generation to the next. Many people with borderline personality disorder have a family history of the disorder.   Physical factors. The part of the brain that controls emotion may be different in people who have borderline personality disorder.   Social factors. Traumatic experiences involving other people may play a role in the development of borderline personality disorder. Examples include neglect, abandonment, and physical and sexual abuse.  SYMPTOMS Signs and symptoms of borderline personality disorder include:  A series of unstable personal relationships.   A strong fear of being abandoned and frantic efforts to avoid abandonment.   Impulsive, self-destructive behavior, such as substance abuse, irrational spending of money, unprotected sex with multiple partners, reckless driving, and binge eating.   Poor self-image that also may change a lot, or a sense of identity that is inconsistent.   Recurring  self-injury or attempted suicide.   Severe mood swings, including depression, irritability, and anxiety.   Lasting feelings of emptiness.   Difficulty controlling anger.   Temporary feelings of paranoia or loss of touch with reality.  DIAGNOSIS A diagnosis of borderline personality disorder requires the presence of at least 5 of the common signs and symptoms. This information is gathered from family and friends as well as medical professionals and legal professionals who have a close association with the patient. This information is also gathered during a psychiatric assessment. During the assessment, the patient is asked about early life experiences, level of education, employment status, physical health conditions, and current prescription and over-the-counter medicines used. TREATMENT Caregivers who usually treat borderline personality disorder are mental health professionals, such as psychologists, psychiatrists, and clinical social workers. More than one type of treatment may be needed. Types of treatment include:  Psychotherapy (also known as talk therapy or counseling).   Cognitive behavioral therapy. This helps the person to recognize and change unhealthy feelings, thoughts, and behaviors. They find new, more positive thoughts and actions to replace the old ones.   Dialectical behavioral therapy. Through this type of treatment, a person learns to understand his or her feelings and to regulate them. This may be one-on-one treatment or part of group therapy.   Family therapy. This treatment includes family members.   Medicine. Medicine may be used to help control emotions, reduce reckless and self-destructive behavior, treat anxiety, and treat depression.  SEEK IMMEDIATE MEDICAL CARE IF:   You cannot control your behavior or emotions.   You think about hurting yourself.   You think about suicide.  FOR MORE INFORMATION National Alliance on Mental Illness: www.nami.org [place]  [employer] of Mental Health: http://www.maynard.net/ Borderline Personality Resource Center: http://bpdresourcecenter.org Document Released: 10/10/2010 Document Revised: 06/14/2011 Document Reviewed: 10/10/2010  ExitCare Patient Information 2012 [place], [place].Depression, Adolescent and Adult Depression is a true and treatable medical condition. In general there are two kinds of depression:  Depression we all experience in some form. For example depression from the death of a loved one, financial distress or natural disasters will trigger or increase depression.   Clinical depression, on the other hand, appears without an apparent cause or reason. This depression is a disease. Depression may be caused by chemical imbalance in the body and brain or may come as a response to a physical illness. Alcohol and other drugs can cause depression.  DIAGNOSIS  The diagnosis of depression is usually based upon symptoms and medical history. TREATMENT  Treatments for depression fall into three categories. These are:  Drug therapy. There are many medicines that treat depression. Responses may vary and sometimes trial and error is necessary to determine the best medicines and dosage for a particular patient.   Psychotherapy, also called talking treatments, helps people resolve their problems by looking at them from a different point of view and by giving people insight into their own personal makeup. Traditional psychotherapy looks at a childhood source of a problem. Other psychotherapy will look at current conflicts and move toward solving those. If the cause of depression is drug use, counseling is available to help abstain. In time the depression will usually improve. If there were underlying causes for the chemical use, they can be addressed.   ECT (electroconvulsive therapy) or shock treatment is not as commonly used today. It is a very effective treatment for severe suicidal depression. During ECT  electrical impulses are applied to the head. These impulses cause a generalized seizure. It can be effective but causes a loss of memory for recent events. Sometimes this loss of memory may include the last several months.  Treat all depression or suicide threats as serious. Obtain professional help. Do not wait to see if serious depression will get better over time without help. Seek help for yourself or those around you. In the [place] the number to the National Suicide Help Lines With 24 Hour Help Are: 1-800-SUICIDE (702) 712-1065 Document Released: 06/22/2000 Document Revised: 06/14/2011 Document Reviewed: 02/11/2008 [facility] Patient Information 2012 [place], [place].Suicidal Feelings, How to Help Yourself Everyone feels sad or unhappy at times, but depressing thoughts and feelings of hopelessness can lead to thoughts of suicide. It can seem as if life is too tough to handle. It is as if the mountain is just too high and your climbing skills are not great enough. At that moment these dark thoughts and feelings may seem overwhelming and never ending. It is important to remember these feelings are temporary! They will go away. If you feel as though you have reached the point where suicide is the only answer, it is time to let someone know immediately. This is the first step to feeling better. The following steps will move you to safer ground and lead you in a positive direction out of depression. HOW TO COPE AND PREVENT SUICIDE  Let family, friends, teachers and/or counselors know. Get help. Try not to isolate yourself from those who care about you. Even though you may not feel sociable or think that you are not good company, talk with someone everyday. It is best if it is face to face. Remember, they will want to help you.   Eat a regularly spaced and well-balanced diet, and get plenty of rest.   Avoid alcohol and drugs because they will only make you feel  worse and may also lower your inhibitions.  Remove them from the home. If you are thinking of taking an overdose of your prescribed medications, give your medicines to someone who can give them to you one day at a time. If you are on antidepressants, let your caregiver know of your feelings so he or she can provide a safer medication, if that is a concern.   Remove weapons or poisons from your home.   Try to stick to routines. That may mean just walking the dog or feeding the cat. Follow a schedule and remind yourself that you have to keep that schedule every day. Play with your pets. If it is possible, and you do not have a pet, get one. They give you a sense of well-being, lower your blood pressure and make your heart feel good. They need you, and we all want to be needed.   Set some realistic goals and achieve them. Make a list and cross things off as you go. Accomplishments give a sense of worth. Wait until you are feeling better before doing things you find difficult or unpleasant to do.   If you are able, try to start exercising. Even half-hour periods of exercise each day will make you feel better. Getting out in the sun or into nature helps you recover from depression faster. If you have a favorite place to walk, take advantage of that.   Increase safe activities that have always given you pleasure. This may include playing your favorite music, reading a good book, painting a picture or playing your favorite instrument. Do whatever takes your mind off your depression and puts a smile on your face.   Keep your living space well lit with windows open, and let the sun shine in. Bright light definitely treats depression, not just people with the seasonal affective disorders (SAD).  Above all else remember, depression is temporary. It will go away. Do not contemplate suicide. Death as a permanent solution is not the answer. Suicide will take away the beautiful rest of life, and do lifelong harm to those around you who love you. Help is  available. National Suicide Help Lines with 24 hour help are: 1-800-SUICIDE 7248076912 Document Released: 12/30/2002 Document Revised: 06/14/2011 Document Reviewed: 05/20/2007 [facility] Patient Information 2012 [place], [place]. RESOURCE GUIDE  Dental Problems  Patients with Medicaid: [facility] [phone] [address]                                           [phone] [address] Phone:  [phone]                                                  Phone:  [phone]  If unable to pay or uninsured, contact:  [facility] or [facility] to become qualified for the adult dental clinic.  Chronic Pain Problems Contact [facility]  [phone] Patients need to be referred by their primary care doctor.  Insufficient Money for Medicine Contact [employer] Way:  call "211" or [facility] 763-318-7345.  No Primary Care Doctor Call  [facility]  930-395-5219 Other agencies that provide inexpensive medical care    [facility]  [phone]    [facility]  [phone]    [facility]  [phone]    [facility]  [phone]    [facility]  310-264-8352    [facility]  [phone]  Psychological Services [facility]  [phone] [facility]  [phone] [facility] Mental Health   [phone] (emergency services 705-060-6154)  Substance Abuse Resources [facility]  6098569494 [facility] [phone] [facility] [place] [phone] Floydene Flock (331)[phone] Residential & Outpatient Substance Abuse Program  [phone]  Abuse/Neglect [facility] Child Abuse Hotline (720)592-6340 [facility] Child Abuse Hotline (762) 125-0162 (After Hours)  Emergency Shelter [facility] [phone]  Maternity Homes [facility] [place] [facility] [phone] [facility] 2316128139  MRSA Hotline #:   (959)216-4426    [facility] Resources  [facility] [place]     [employer]                          [facility] Dept. [address] [place]                       [address]      [address]  Blondell Reveal Phone:  [phone]                                   Phone:  [phone]                 Phone:  [phone]  [facility] Mental Health Phone:  [phone]  [facility] Child Abuse Hotline [phone] [phone] (After Hours)

## 2011-10-19 NOTE — ED Provider Notes (Signed)
9:54 AM Filed Vitals:   10/19/11 0614  BP: 99/71  Pulse: 71  Temp: 97.6 F (36.4 C)  Resp: 18  The patient was seen and evaluated by me this morning and is in no apparent distress, is awake and alert, and is aware of the current treatment plan.  The patient is currently calm and cooperative and is denying any suicidal ideation or plans at this time. She politely requested to be discharged home and states that she is agreeable to followup as an outpatient with mental health resources. The patient was reevaluated by psychiatrist Dr. Jacky Kindle by tele-psychiatric consult, and he finds the patient at this time not to represent a threat to herself or others and to be safe and appropriate for discharge home and outpatient followup. He is signs of diagnosis of bipolar disorder, and advises the patient to take Remeron 15 mg by mouth each bedtime. I find Dr. Lanell Matar recommendations to be appropriate and that the patient is safe and stable for discharge and will do so at this time.  Felisa Bonier, MD 10/19/11 [phone]

## 2011-10-19 NOTE — BH Assessment (Signed)
Assessment Note   Yolanda [name] is an 36 y.o. female.  Yolanda [name] was re-assessed today.  She currently is denying any suicidal ideation or intent.  No plan is forthcoming.  She also is denying HI or A/V hallucinations.  Yolanda [name] said that she does not want to go back to her previous living situation but says that she has a female friend that will let her stay at his house.  She said that she has had a good day today.  Yolanda [name] is still difficult to understand with her slow, slurred speech.  She maintains more eye contact and is getting up and moving about better than before.  Yolanda [name] needs a 2nd telepsych consult to determine if she should continue to be waiting for an inpatient bed.  She has been declined at [facility] is still continuing to consider her for inpatient care.  This clinician spoke with Dr. Hyacinth Meeker [facility]) about ordering another telepsych and he concurred that if she does not meet criteria per the psychiatrist, then she may go home. Axis I: Mood Disorder NOS and Post Traumatic Stress Disorder Axis II: Deferred Axis III:  Past Medical History  Diagnosis Date  . Depression    Axis IV: other psychosocial or environmental problems, problems related to social environment and problems with primary support group Axis V: 41-50 serious symptoms  Past Medical History:  Past Medical History  Diagnosis Date  . Depression     Past Surgical History  Procedure Date  . Salpingectomy   . No past surgeries     Family History: No family history on file.  Social History:  reports that she has been smoking.  She does not have any smokeless tobacco history on file. She reports that she drinks alcohol. She reports that she uses illicit drugs (Cocaine and Marijuana).  Additional Social History:  Alcohol / Drug Use Pain Medications: none Prescriptions: none Over the Counter: none History of alcohol / drug use?: No history of alcohol / drug abuse Substance #1 Name of Substance 1:  Cocaine 1 - Age of First Use: unknown 1 - Amount (size/oz): unknown 1 - Frequency: unknown 1 - Duration: unknown 1 - Last Use / Amount: unknown Substance #2 Name of Substance 2: THC 2 - Age of First Use: unknown 2 - Amount (size/oz): unknown 2 - Frequency: unknown 2 - Duration: unknown 2 - Last Use / Amount: unknown Allergies: No Known Allergies  Home Medications:  Medications Prior to Admission  Medication Dose Route Frequency Provider Last Rate Last Dose  . acetaminophen (TYLENOL) tablet 650 mg  650 mg Oral Q4H PRN Vida Roller, MD      . alum & mag hydroxide-simeth (MAALOX/MYLANTA) 200-200-20 MG/5ML suspension 30 mL  30 mL Oral PRN Vida Roller, MD   30 mL at 10/18/11 2208  . azithromycin (ZITHROMAX) tablet 1,000 mg  1,000 mg Oral Once Vida Roller, MD   1,000 mg at 10/16/11 0641  . cefTRIAXone (ROCEPHIN) injection 250 mg  250 mg Intramuscular Once Vida Roller, MD   250 mg at 10/16/11 0641  . ibuprofen (ADVIL,MOTRIN) tablet 600 mg  600 mg Oral Q8H PRN Vida Roller, MD      . lidocaine (XYLOCAINE) 1 % injection        1.8 mL at 10/16/11 0642  . LORazepam (ATIVAN) tablet 1 mg  1 mg Oral Q8H PRN Vida Roller, MD      . mirtazapine (REMERON) tablet 15 mg  15  mg Oral QHS Dayton Bailiff, MD   15 mg at 10/18/11 2145  . nicotine (NICODERM CQ - dosed in mg/24 hours) patch 21 mg  21 mg Transdermal Daily Vida Roller, MD      . ondansetron [facility]) tablet 4 mg  4 mg Oral Q8H PRN Vida Roller, MD      . ziprasidone (GEODON) capsule 20 mg  20 mg Oral BID WC Dayton Bailiff, MD   20 mg at 10/18/11 1651  . zolpidem (AMBIEN) tablet 5 mg  5 mg Oral QHS PRN Vida Roller, MD       No current outpatient prescriptions on file as of 10/16/2011.    OB/GYN Status:  Patient's last menstrual period was 10/15/2011.  General Assessment Data Location of Assessment: [facility] ED ACT Assessment: Yes Living Arrangements:  (Pt reports she can get other housing with a friend) Can pt return to current  living arrangement?: Yes Admission Status: Voluntary Is patient capable of signing voluntary admission?: Yes Transfer from: Acute Hospital Referral Source: Self/Family/Friend  Education Status Is patient currently in school?: No Current Grade: na Highest grade of school patient has completed: na Name of school: na Contact person: na  Risk to self Suicidal Ideation: No (Patient currently denying SI) Suicidal Intent: No Is patient at risk for suicide?: No Suicidal Plan?: No Access to Means: No What has been your use of drugs/alcohol within the last 12 months?: Pt has positive UDS for THC and cocaine Previous Attempts/Gestures: Yes How many times?: 3  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, drug abuse Triggers for Past Attempts: Unknown Intentional Self Injurious Behavior: None Family Suicide History: Unknown Recent stressful life event(s): 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) Persecutory voices/beliefs?: No Depression: Yes Depression Symptoms: Guilt;Feeling worthless/self pity Substance abuse history and/or treatment for substance abuse?: No (Pt denies) Suicide prevention information given to non-admitted patients: Not applicable  Risk to Others Homicidal Ideation: No Thoughts of Harm to Others: No Current Homicidal Intent: No Current Homicidal Plan: No Access to Homicidal Means: No Identified Victim: No one History of harm to others?: No Assessment of Violence: None Noted Violent Behavior Description: Patient calm and cooperative Does patient have access to weapons?: No Criminal Charges Pending?: No Does patient have a court date: No  Psychosis Hallucinations: None noted Delusions: None noted  Mental Status Report Appear/Hygiene: Disheveled Eye Contact: Fair Motor Activity: Unremarkable Speech: Other (Comment) (Pt is hard to understand, slow speech, slurred.) Level of Consciousness: Quiet/awake Mood: Depressed;Sad Affect:  Depressed Anxiety Level: Minimal Thought Processes: Relevant Judgement: Unimpaired Orientation: Person;Place;Time;Situation Obsessive Compulsive Thoughts/Behaviors: None  Cognitive Functioning Concentration: Normal Memory: Recent Intact;Remote Intact IQ: Below Average Level of Function: Borderline intellectual Insight: Fair Impulse Control: Poor Appetite: Good Weight Loss: 0  Weight Gain: 0  Sleep: No Change Total Hours of Sleep: 5  Vegetative Symptoms: None  Prior Inpatient Therapy Prior Inpatient Therapy: Yes Prior Therapy Dates: 2009, 2010, 2011, 2012 Prior Therapy Facilty/Provider(s[facility] Reason for Treatment: SI  Prior Outpatient Therapy Prior Outpatient Therapy: No Prior Therapy Dates: unknown Prior Therapy Facilty/Provider(s): unknown Reason for Treatment: unknown          Abuse/Neglect Assessment (Assessment to be complete while patient is alone) Physical Abuse:  (pt did not answer) Verbal Abuse:  (pt did not answer) Sexual Abuse:  (pt did not answer) Exploitation of patient/patient's resources:  (pt did not answer) Self-Neglect:  (pt did not answer) Values / Beliefs Cultural Requests During Hospitalization: None Spiritual Requests During  Hospitalization: None   Advance Directives (For Healthcare) Advance Directive:  (unknown)    Additional Information 1:1 In Past 12 Months?: No CIRT Risk: No Elopement Risk: No Does patient have medical clearance?: Yes     Disposition:  Disposition Disposition of Patient: Inpatient treatment program Type of inpatient treatment program: Adult Other disposition(s): Other (Comment) (Patient to receive new telepsych in AM)  On Site Evaluation by:   Reviewed with Physician:  Dr. Eber Hong   Beatriz Stallion Ray 10/19/2011 7:21 AM

## 2011-10-21 ENCOUNTER — Emergency Department (HOSPITAL_COMMUNITY)
Admission: EM | Admit: 2011-10-21 | Discharge: 2011-10-28 | Disposition: A | Discharge status: Home / Self Care | Payer: Medicaid Other | Attending: Emergency Medicine | Admitting: Emergency Medicine

## 2011-10-21 ENCOUNTER — Encounter (HOSPITAL_COMMUNITY): Payer: Self-pay | Admitting: *Deleted

## 2011-10-21 DIAGNOSIS — F17200 Nicotine dependence, unspecified, uncomplicated: Secondary | ICD-10-CM | POA: Insufficient documentation

## 2011-10-21 DIAGNOSIS — Z5900 Homelessness unspecified: Secondary | ICD-10-CM | POA: Insufficient documentation

## 2011-10-21 DIAGNOSIS — F3289 Other specified depressive episodes: Secondary | ICD-10-CM | POA: Insufficient documentation

## 2011-10-21 DIAGNOSIS — F1410 Cocaine abuse, uncomplicated: Secondary | ICD-10-CM | POA: Insufficient documentation

## 2011-10-21 DIAGNOSIS — F329 Major depressive disorder, single episode, unspecified: Secondary | ICD-10-CM

## 2011-10-21 DIAGNOSIS — R45851 Suicidal ideations: Secondary | ICD-10-CM | POA: Insufficient documentation

## 2011-10-21 LAB — CBC
MCH: 29.6 pg (ref 26.0–34.0)
MCV: 90.7 fL (ref 78.0–100.0)
Platelets: 213 10*3/uL (ref 150–400)
RDW: 13.3 % (ref 11.5–15.5)
WBC: 4.1 10*3/uL (ref 4.0–10.5)

## 2011-10-21 LAB — DIFFERENTIAL
Basophils Relative: 1 % (ref 0–1)
Eosinophils Absolute: 0.2 10*3/uL (ref 0.0–0.7)
Monocytes Relative: 8 % (ref 3–12)
Neutrophils Relative %: 52 % (ref 43–77)

## 2011-10-21 LAB — RAPID URINE DRUG SCREEN, HOSP PERFORMED
Barbiturates: NOT DETECTED
Tetrahydrocannabinol: NOT DETECTED

## 2011-10-21 LAB — URINALYSIS, ROUTINE W REFLEX MICROSCOPIC
Bilirubin Urine: NEGATIVE
Ketones, ur: NEGATIVE mg/dL
Nitrite: NEGATIVE
Specific Gravity, Urine: 1.024 (ref 1.005–1.030)
Urobilinogen, UA: 0.2 mg/dL (ref 0.0–1.0)

## 2011-10-21 LAB — BASIC METABOLIC PANEL
BUN: 7 mg/dL (ref 6–23)
Creatinine, Ser: 0.72 mg/dL (ref 0.50–1.10)
GFR calc Af Amer: >90 mL/min (ref >90)
GFR calc non Af Amer: >90 mL/min (ref >90)
Potassium: 3.9 mEq/L (ref 3.5–5.1)

## 2011-10-21 LAB — URINE MICROSCOPIC-ADD ON

## 2011-10-21 MED ORDER — LORAZEPAM 1 MG PO TABS
1.0000 mg | ORAL_TABLET | Freq: Three times a day (TID) | ORAL | Status: DC | PRN
  Administered 2011-10-23 – 2011-10-27 (×4): 1 mg via ORAL
  Filled 2011-10-21 (×2): qty 1
  Filled 2011-10-21: qty 2
  Filled 2011-10-21 (×2): qty 1

## 2011-10-21 MED ORDER — MIRTAZAPINE 15 MG PO TABS
15.0000 mg | ORAL_TABLET | Freq: Every day | ORAL | Status: DC
  Administered 2011-10-21 – 2011-10-27 (×7): 15 mg via ORAL
  Filled 2011-10-21 (×7): qty 1

## 2011-10-21 NOTE — ED Notes (Signed)
Patient currently sitting up in bed; no respiratory or acute distress noted.  Sitter present at bedside.  Patient requesting more snacks; patient given cheese, crackers, and sprite.  Patient has no other questions or concerns; will continue to monitor.

## 2011-10-21 NOTE — BH Assessment (Signed)
Assessment Note   Yolanda [name] is an 36 y.o. female Patient was referred to [facility] following assessment in the [facility] Emergency Department for suicidal ideation with a plan to cut her wrist with a razor. The patient was assessed and referred to [facility] for inpatient psychiatric treatment. Counselor followed up on the disposition of referral; Melanie at [facility] stated that the patient was declined due to information gathered in a public offender search @ [email or web address]/ stating that the patient is an absconder from probation/parole supervision that was conducted by [facility] during to referral process.   Axis I: Mood Disorder NOS Axis II: Deferred Axis III:  Past Medical History  Diagnosis Date  . Depression    Axis IV: economic problems, housing problems, problems related to social environment and problems with primary support group Axis V: 11-20 some danger of hurting self or others possible OR occasionally fails to maintain minimal personal hygiene OR gross impairment in communication  Past Medical History:  Past Medical History  Diagnosis Date  . Depression     Past Surgical History  Procedure Date  . Salpingectomy   . No past surgeries     Family History: No family history on file.  Social History:  reports that she has been smoking.  She does not have any smokeless tobacco history on file. She reports that she drinks alcohol. She reports that she uses illicit drugs (Cocaine and Marijuana).  Additional Social History:    Allergies: No Known Allergies  Home Medications:  Medications Prior to Admission  Medication Dose Route Frequency Provider Last Rate Last Dose  . LORazepam (ATIVAN) tablet 1 mg  1 mg Oral Q8H PRN Juliet Rude. Pickering, MD      . mirtazapine (REMERON) tablet 15 mg  15 mg Oral QHS Nathan R. Rubin Payor, MD       Medications Prior to Admission  Medication Sig Dispense Refill  . mirtazapine (REMERON) 15 MG tablet  Take 15 mg by mouth at bedtime.      Marland Kitchen DISCONTD: mirtazapine (REMERON) 15 MG tablet Take 1 tablet (15 mg total) by mouth at bedtime.  30 tablet  0    OB/GYN Status:  Patient's last menstrual period was 10/15/2011.  General Assessment Data Location of Assessment: [facility] ED ACT Assessment: Yes Living Arrangements: Other (Comment) (pt states she lives where she can) Can pt return to current living arrangement?: Yes Admission Status: Voluntary Is patient capable of signing voluntary admission?: Yes Transfer from: Home Referral Source: Self/Family/Friend  Education Status Is patient currently in school?: No  Risk to self Suicidal Ideation: Yes-Currently Present Suicidal Intent: Yes-Currently Present Is patient at risk for suicide?: Yes Suicidal Plan?: Yes-Currently Present Specify Current Suicidal Plan: pt states that she planned to cut her wrist with a razer Access to Means: Yes Specify Access to Suicidal Means: pt can find a razer within any home What has been your use of drugs/alcohol within the last 12 months?: pt denied using any drugs Previous Attempts/Gestures: Yes How many times?: 3  Triggers for Past Attempts: Unpredictable;Unknown (pt states that she is mental and does it for no reason) Intentional Self Injurious Behavior: None Family Suicide History: Unknown Recent stressful life event(s): Other (Comment) (pt states that she could not get her medication) Persecutory voices/beliefs?: No Depression: Yes Depression Symptoms: Tearfulness;Fatigue Substance abuse history and/or treatment for substance abuse?: Yes Suicide prevention information given to non-admitted patients: Not applicable  Risk to Others Homicidal Ideation: No Thoughts of Harm to Others: No  Current Homicidal Intent: No Current Homicidal Plan: No Access to Homicidal Means: No Identified Victim: n/a History of harm to others?: No Assessment of Violence: None Noted Violent Behavior Description: UTA Does  patient have access to weapons?:  (UTA) Criminal Charges Pending?:  (UTA) Does patient have a court date:  (UTA)  Psychosis Hallucinations: None noted Delusions: None noted  Mental Status Report Appear/Hygiene: Disheveled Eye Contact: Poor Motor Activity: Freedom of movement Speech: Slurred;Incoherent;Soft Level of Consciousness: Sleeping;Drowsy Mood: Other (Comment) (UTA) Affect: Other (Comment);Unable to Assess Anxiety Level:  (UTA) Thought Processes: Irrelevant Judgement: Impaired Orientation: Unable to assess Obsessive Compulsive Thoughts/Behaviors:  (UTA)  Cognitive Functioning Concentration:  (UTA) Memory:  (UTA) IQ: Average Level of Function: UTA Insight: Poor Impulse Control:  (UTA) Appetite:  (UTA) Weight Loss:  (unknown) Weight Gain:  (unknown) Sleep: Increased Vegetative Symptoms:  (UTA)  Prior Inpatient Therapy Prior Inpatient Therapy: Yes Prior Therapy Dates: 2009, 2010, 2011, 2012 Prior Therapy Facilty/Provider(s): [facility] Reason for Treatment: SI  Prior Outpatient Therapy Prior Outpatient Therapy: No Prior Therapy Dates: unknown Prior Therapy Facilty/Provider(s): unknown Reason for Treatment: unknown            Values / Beliefs Cultural Requests During Hospitalization: None Spiritual Requests During Hospitalization: None        Additional Information 1:1 In Past 12 Months?: No CIRT Risk: No Elopement Risk: No Does patient have medical clearance?: Yes    Disposition: Disposition Disposition of Patient: Inpatient treatment program Type of inpatient treatment program: Adult Other disposition(s): Other (Comment) (referral to [facility] )  On Site Evaluation by:   Reviewed with Physician:     Danne Harbor MS, LPCA 10/21/2011 8:32 PM

## 2011-10-21 NOTE — BH Assessment (Signed)
Assessment Note   Yolanda [name] is an 36 y.o. female.  Pt states that she was going to cut her wrists today with a razer but a friend stopped her; ACT counselor attempted to inquire with pt on why she attempted to cut her wrists and pt states that she has not been able to get her medication and that she does not have a clear reason as to why she was going to do it; pt states that she thinks about doing it all the time; pt states that she has SI intent and that if she is released she will try to cut her wrist; clt again did not have a reason that she could verbalize; pt states that it is hard being bipolar and schizophrenic and that she cannot describe how she feels; pt was very hard to understand because of her slurred and soft speech; ACT counselor had to keep waking pt up to answer questions; pt denies any HI; states that she has never been HI within the past 6 months;pt denies any substance abuse use; pt states that she has had previous treatment at [facility]; pt states that she has family support and that she has two daughters; pt could not answer where they live;   Axis I: Mood Disorder NOS Axis II: Deferred Axis III:  Past Medical History  Diagnosis Date  . Depression    Axis IV: economic problems, housing problems, problems related to social environment and problems with primary support group Axis V: 11-20 some danger of hurting self or others possible OR occasionally fails to maintain minimal personal hygiene OR gross impairment in communication  Past Medical History:  Past Medical History  Diagnosis Date  . Depression     Past Surgical History  Procedure Date  . Salpingectomy   . No past surgeries     Family History: No family history on file.  Social History:  reports that she has been smoking.  She does not have any smokeless tobacco history on file. She reports that she drinks alcohol. She reports that she uses illicit drugs (Cocaine and Marijuana).  Additional Social  History:    Allergies: No Known Allergies  Home Medications:  Medications Prior to Admission  Medication Dose Route Frequency Provider Last Rate Last Dose  . LORazepam (ATIVAN) tablet 1 mg  1 mg Oral Q8H PRN Juliet Rude. Pickering, MD      . mirtazapine (REMERON) tablet 15 mg  15 mg Oral QHS Nathan R. Rubin Payor, MD       Medications Prior to Admission  Medication Sig Dispense Refill  . mirtazapine (REMERON) 15 MG tablet Take 15 mg by mouth at bedtime.      Marland Kitchen DISCONTD: mirtazapine (REMERON) 15 MG tablet Take 1 tablet (15 mg total) by mouth at bedtime.  30 tablet  0    OB/GYN Status:  Patient's last menstrual period was 10/15/2011.  General Assessment Data Location of Assessment: [facility] ED ACT Assessment: Yes Living Arrangements: Other (Comment) (pt states she lives where she can) Can pt return to current living arrangement?: Yes Admission Status: Voluntary Is patient capable of signing voluntary admission?: Yes Transfer from: Home Referral Source: Self/Family/Friend  Education Status Is patient currently in school?: No  Risk to self Suicidal Ideation: Yes-Currently Present Suicidal Intent: Yes-Currently Present Is patient at risk for suicide?: Yes Suicidal Plan?: Yes-Currently Present Specify Current Suicidal Plan: pt states that she planned to cut her wrist with a razer Access to Means: Yes Specify Access to Suicidal Means:  pt can find a razer within any home What has been your use of drugs/alcohol within the last 12 months?: pt denied using any drugs Previous Attempts/Gestures: Yes How many times?: 3  Triggers for Past Attempts: Unpredictable;Unknown (pt states that she is mental and does it for no reason) Intentional Self Injurious Behavior: None Family Suicide History: Unknown Recent stressful life event(s): Other (Comment) (pt states that she could not get her medication) Persecutory voices/beliefs?: No Depression: Yes Depression Symptoms: Tearfulness;Fatigue Substance  abuse history and/or treatment for substance abuse?: No (pt denied using any drugs or alcohol) Suicide prevention information given to non-admitted patients: Not applicable  Risk to Others Homicidal Ideation: No Thoughts of Harm to Others: No Current Homicidal Intent: No Current Homicidal Plan: No Access to Homicidal Means: No Identified Victim: n/a History of harm to others?: No Assessment of Violence: None Noted Violent Behavior Description: UTA Does patient have access to weapons?:  (UTA) Criminal Charges Pending?:  (UTA) Does patient have a court date:  (UTA)  Psychosis Hallucinations: None noted Delusions: None noted  Mental Status Report Appear/Hygiene: Disheveled Eye Contact: Poor Motor Activity: Unable to assess (pt attempted to sleep through assessment) Speech: Slurred;Incoherent;Soft Level of Consciousness: Sleeping;Drowsy Mood: Other (Comment) (UTA) Affect: Other (Comment);Unable to Assess Anxiety Level:  (UTA) Thought Processes: Irrelevant Judgement: Impaired Orientation: Unable to assess Obsessive Compulsive Thoughts/Behaviors:  (UTA)  Cognitive Functioning Concentration:  (UTA) Memory:  (UTA) IQ: Average Level of Function: UTA Insight: Poor Impulse Control:  (UTA) Appetite:  (UTA) Weight Loss:  (unknown) Weight Gain:  (unknown) Sleep: Increased Vegetative Symptoms:  (UTA)  Prior Inpatient Therapy Prior Inpatient Therapy: Yes Prior Therapy Dates: 2009, 2010, 2011, 2012 Prior Therapy Facilty/Provider(s): [facility] Reason for Treatment: SI  Prior Outpatient Therapy Prior Outpatient Therapy: No Prior Therapy Dates: unknown Prior Therapy Facilty/Provider(s): unknown Reason for Treatment: unknown                     Additional Information 1:1 In Past 12 Months?: No CIRT Risk: No Elopement Risk: No Does patient have medical clearance?: Yes     Disposition: pt will be [facility]; ACT counselor called [facility], and [place] all full to capacity Disposition Disposition of Patient: Inpatient treatment program Type of inpatient treatment program: Adult Other disposition(s): Other (Comment) (referral to [facility] )  On Site Evaluation by:   Reviewed with Physician:     Earmon Phoenix 10/21/2011 6:03 PM

## 2011-10-21 NOTE — ED Notes (Signed)
Patient currently resting quietly in bed; no respiratory or acute distress noted.  Sitter present at bedside.  Patient requesting something to eat; patient given cheese, crackers, and sprite.  Patient has no other questions or concerns at this time; will continue to monitor.

## 2011-10-21 NOTE — ED Notes (Signed)
Patient currently asleep in bed; no respiratory or acute distress noted.  Sitter present at bedside.  Will continue to monitor. 

## 2011-10-21 NOTE — ED Notes (Signed)
Pt unable to void at this time. 

## 2011-10-21 NOTE — ED Notes (Signed)
Received bedside report from Raub, Charity fundraiser.  Patient currently lying quietly in bed; no respiratory or acute distress noted.  Patient not making eye contact; will not answer questions.  Per sitter, patient finished all her dinner and laid back down after eating.  Patient updated on plan of care; informed patient that we are currently waiting to hear an updated from ACT team.  Ophelia Charter is present at bedside.  Patient has no other questions or concerns at this time; will continue to monitor.

## 2011-10-21 NOTE — ED Provider Notes (Signed)
History     CSN: [id number]  Arrival date & time 10/21/11  1320   First MD Initiated Contact with Patient 10/21/11 1538      Chief Complaint  Patient presents with  . Suicidal    PT was D/C Friday with same DX   Level V caveat due to uncooperativeness (Consider location/radiation/quality/duration/timing/severity/associated sxs/prior treatment) The history is provided by the patient.   patient was DC'd from the ER on the 12th after being here for substance abuse and suicidal ideation. She's now stating that she is suicidal again. She states she would cut her wrists. She's overall uncooperative with the history  Past Medical History  Diagnosis Date  . Depression     Past Surgical History  Procedure Date  . Salpingectomy   . No past surgeries     No family history on file.  History  Substance Use Topics  . Smoking status: Current Everyday Smoker  . Smokeless tobacco: Not on file  . Alcohol Use: Yes    OB History    Grav Para Term Preterm Abortions TAB SAB Ect Mult Living   5 2 2  0 2  1 1  0 2      Review of Systems  Unable to perform ROS: Psychiatric disorder    Allergies  Review of patient's allergies indicates no known allergies.  Home Medications   Current Outpatient Rx  Name Route Sig Dispense Refill  . MIRTAZAPINE 15 MG PO TABS Oral Take 15 mg by mouth at bedtime.      BP 100/62  Pulse 89  Temp(Src) 97.6 F (36.4 C) (Oral)  Resp 16  SpO2 98%  LMP 10/15/2011  Physical Exam  Constitutional: She appears well-developed and well-nourished.  HENT:  Head: Normocephalic.  Eyes: Conjunctivae are normal. Pupils are equal, round, and reactive to light.  Neck: Normal range of motion.  Cardiovascular: Normal rate and regular rhythm.   Pulmonary/Chest: Effort normal.  Abdominal: Soft. Bowel sounds are normal.  Musculoskeletal: She exhibits no edema.  Neurological: She is alert.  Psychiatric:       Patient is uncooperative with examination. Appears  depressed    ED Course  Procedures (including critical care time)  Labs Reviewed  CBC - Abnormal; Notable for the following:    Hemoglobin 11.5 (*)    HCT 35.2 (*)    All other components within normal limits  DIFFERENTIAL - Abnormal; Notable for the following:    Eosinophils Relative 6 (*)    All other components within normal limits  BASIC METABOLIC PANEL - Abnormal; Notable for the following:    Glucose, Bld 101 (*)    All other components within normal limits  URINALYSIS, ROUTINE W REFLEX MICROSCOPIC - Abnormal; Notable for the following:    APPearance TURBID (*)    Hgb urine dipstick SMALL (*)    All other components within normal limits  URINE RAPID DRUG SCREEN (HOSP PERFORMED) - Abnormal; Notable for the following:    Cocaine POSITIVE (*)    All other components within normal limits  URINE MICROSCOPIC-ADD ON - Abnormal; Notable for the following:    Squamous Epithelial / LPF FEW (*)    Bacteria, UA MANY (*)    All other components within normal limits  PREGNANCY, URINE  ETHANOL   No results found.   1. Suicidal ideation       MDM  Patient's returns suicidal again. She left 2 days ago. She's not been involuntarily committed at this time. She is homeless and  I doubt she will ask to leave. She appears to be medically cleared. ACT has been notified.  Juliet Rude. Rubin Payor, MD 10/21/11 1955

## 2011-10-21 NOTE — ED Notes (Signed)
PT was D/C from ED on Friday 10-19-11 after eval by [facility] for wanting to kill herself.  Pt now returns with the same felling and a plan to cut with a knife .

## 2011-10-21 NOTE — ED Notes (Signed)
Meal tray ordered 

## 2011-10-22 MED ORDER — IBUPROFEN 200 MG PO TABS: 600.0000 mg | ORAL_TABLET | Freq: Three times a day (TID) | ORAL | Status: DC | PRN

## 2011-10-22 MED ORDER — ALUM & MAG HYDROXIDE-SIMETH 200-200-20 MG/5ML PO SUSP: 30.0000 mL | ORAL | Status: DC | PRN

## 2011-10-22 MED ORDER — ONDANSETRON HCL 8 MG PO TABS: 4.0000 mg | ORAL_TABLET | Freq: Three times a day (TID) | ORAL | Status: DC | PRN

## 2011-10-22 MED ORDER — ACETAMINOPHEN 325 MG PO TABS: 650.0000 mg | ORAL_TABLET | ORAL | Status: DC | PRN

## 2011-10-22 NOTE — ED Notes (Signed)
Patient currently asleep in bed; no respiratory or acute distress noted.  Sitter present at bedside.  Dr. Nicanor Alcon notified that patient only has holding orders for Ativan; EDP to put in psych holding orders.  Will continue to monitor.

## 2011-10-22 NOTE — ED Notes (Signed)
Patient currently asleep in bed; no respiratory or acute distress noted.  Sitter present at bedside.  Will continue to monitor. 

## 2011-10-22 NOTE — ED Notes (Signed)
Patient currently resting quietly in bed; no respiratory or acute distress noted.  Sitter present at bedside.  Will continue to monitor. 

## 2011-10-22 NOTE — ED Notes (Signed)
Patient currently asleep in bed; no respiratory or acute distress noted.  Sitter present at bedside; denies needs at this time.  Will continue to monitor. 

## 2011-10-22 NOTE — ED Notes (Signed)
Received bedside report from Maralyn Sago, RN.  Patient currently resting quietly in bed; no respiratory or acute distress noted.  Sitter present at bedside.  Patient updated on plan of care; informed patient that per ACT team, patient is pending for placement at [facility].  Patient reports suicidal ideations at this time (reports that she is thinking about cutting herself/running out in traffic if she leaves).  Patient wanting cookies at this time; denies any other needs.  Will continue to monitor.

## 2011-10-22 NOTE — ED Notes (Addendum)
Patient currently resting quietly in bed; no respiratory or acute distress noted.  Sitter present at bedside.  Will continue to monitor. 

## 2011-10-22 NOTE — ED Notes (Signed)
Pt confirming SI, reporting plan to walk into traffic. Denying any HI. Pt appearing withdrawn, speech is slow and somewhat slurred. Pt answer questions appropriately but appears guarded with answers. When asked how pt is doing reporting "I feel fine". Pt sitting on bed with arms crossed during telepsych assessment. No making eye contact.

## 2011-10-22 NOTE — ED Notes (Signed)
Pt resting, sleeping, sitter at bedside. Denying any needs. Pt has had shower, linens changed.

## 2011-10-22 NOTE — ED Notes (Signed)
Sitter at bedside; pt provided sprite along with breakfast tray. Pt resting, calm and cooperative. Alert, denying any other needs at this time.

## 2011-10-22 NOTE — ED Notes (Signed)
Patient currently resting quietly in bed; no respiratory or acute distress noted.  Sitter present at bedside.  Patient requesting snacks; given cheese and crackers.  Patient has no other questions or concerns at this time. Sitter denies any needs.  Will continue to monitor.

## 2011-10-22 NOTE — ED Notes (Signed)
Dinner tray delivered.

## 2011-10-22 NOTE — ED Notes (Signed)
Dinner tray ordered, reg nonsharp

## 2011-10-22 NOTE — ED Notes (Signed)
Patient offered shower; patient states that she had shower earlier this afternoon.

## 2011-10-22 NOTE — ED Notes (Signed)
Dr. Shela Commons in to see patient, ordering telepsych on pt. Filling form out to get set up for patient.

## 2011-10-22 NOTE — ED Provider Notes (Addendum)
Sleeping, easily arousable, cooperative. Continues to feel suicidal. Telepsych ordered to assesss for ongoing therapy p[ending palcement  Doug Sou, MD 10/22/11 1610  Doug Sou, MD 10/22/11 [phone]

## 2011-10-23 MED ORDER — NICOTINE 21 MG/24HR TD PT24
MEDICATED_PATCH | TRANSDERMAL | Status: AC
  Filled 2011-10-23: qty 1

## 2011-10-23 MED ORDER — NICOTINE 21 MG/24HR TD PT24: 21.0000 mg | MEDICATED_PATCH | Freq: Once | TRANSDERMAL | Status: DC

## 2011-10-23 NOTE — ED Notes (Signed)
Patient sitting up eating lunch.

## 2011-10-23 NOTE — BH Assessment (Signed)
Assessment Note   Yolanda [name] is an 36 y.o. female that was reassessed this day.  Pt was drowsy, did admit to continued SI with plan to use a razor to cut her wrist if she was discharged from ED.  Pt denies HI or psychosis.  Pt denies drug use, although admitted to using cocaine and THC in the past.  Writer had to continue to wake pt to answer questions.  Pt had a telepsych that recommended inpatient treatment.  Pt was declined by [facility].  Pt was declined by [facility] by NP Lynann Bologna, due to pt's developmental disability as well as the belief that pt is presenting at her baseline and does not exhibit any acute psychiatric symptoms.  Called Chris at [facility] and beds available per [place] @ 1410.  Faxed over referral for review.  Completed reassessment, assessment notification and faxed to [facility] to log.  Udpated ED staff.  Axis I: Depressive Disorder NOS Axis II: Deferred Axis III:  Past Medical History  Diagnosis Date  . Depression    Axis IV: economic problems, housing problems, other psychosocial or environmental problems, problems related to social environment and problems with primary support group Axis V: 31-40 impairment in reality testing  Past Medical History:  Past Medical History  Diagnosis Date  . Depression     Past Surgical History  Procedure Date  . Salpingectomy   . No past surgeries     Family History: No family history on file.  Social History:  reports that she has been smoking.  She does not have any smokeless tobacco history on file. She reports that she drinks alcohol. She reports that she uses illicit drugs (Cocaine and Marijuana).  Additional Social History:    Allergies: No Known Allergies  Home Medications:  Medications Prior to Admission  Medication Dose Route Frequency Provider Last Rate Last Dose  . acetaminophen (TYLENOL) tablet 650 mg  650 mg Oral Q4H PRN April K Palumbo-Rasch, MD      . alum & mag hydroxide-simeth (MAALOX/MYLANTA)  200-200-20 MG/5ML suspension 30 mL  30 mL Oral PRN April K Palumbo-Rasch, MD      . ibuprofen (ADVIL,MOTRIN) tablet 600 mg  600 mg Oral Q8H PRN April K Palumbo-Rasch, MD      . LORazepam (ATIVAN) tablet 1 mg  1 mg Oral Q8H PRN Juliet Rude. Pickering, MD      . mirtazapine (REMERON) tablet 15 mg  15 mg Oral QHS Nathan R. Pickering, MD   15 mg at 10/22/11 2204  . ondansetron (ZOFRAN) tablet 4 mg  4 mg Oral Q8H PRN April K Palumbo-Rasch, MD       Medications Prior to Admission  Medication Sig Dispense Refill  . mirtazapine (REMERON) 15 MG tablet Take 15 mg by mouth at bedtime.        OB/GYN Status:  Patient's last menstrual period was 10/15/2011.  General Assessment Data Location of Assessment: [facility] ED ACT Assessment: Yes Living Arrangements: Other (Comment) (pt stated she lives where she can) Can pt return to current living arrangement?: Yes Admission Status: Voluntary Is patient capable of signing voluntary admission?: Yes Transfer from: Acute Hospital Referral Source: Self/Family/Friend  Education Status Is patient currently in school?: No  Risk to self Suicidal Ideation: Yes-Currently Present Suicidal Intent: Yes-Currently Present Is patient at risk for suicide?: Yes Suicidal Plan?: Yes-Currently Present Specify Current Suicidal Plan: pt stated that she planned to cut herself with a razor Access to Means: Yes Specify Access to Suicidal  Means: pt can find razor What has been your use of drugs/alcohol within the last 12 months?: pt denies use of drugs, but on last admit, tested positive for THC and cocaine Previous Attempts/Gestures: Yes How many times?: 3  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, drug use Triggers for Past Attempts: Unpredictable;Unknown Intentional Self Injurious Behavior: None (pt denies) Family Suicide History: Unknown Recent stressful life event(s): Other (Comment) (pt homess, stated she couldn't get her medication) Persecutory voices/beliefs?:  No Depression: Yes Depression Symptoms: Despondent;Tearfulness;Feeling worthless/self pity;Fatigue Substance abuse history and/or treatment for substance abuse?: Yes Suicide prevention information given to non-admitted patients: Not applicable  Risk to Others Homicidal Ideation: No Thoughts of Harm to Others: No Current Homicidal Intent: No Current Homicidal Plan: No Access to Homicidal Means: No Identified Victim: na History of harm to others?: No Assessment of Violence: None Noted Violent Behavior Description: UTA Does patient have access to weapons?:  (UTA) Criminal Charges Pending?:  (UTA) Does patient have a court date:  (UTA)  Psychosis Hallucinations: None noted Delusions: None noted  Mental Status Report Appear/Hygiene: Disheveled Eye Contact: Poor Motor Activity: Unable to assess (pt attempted to sleep during assessment) Speech: Slurred;Incoherent;Soft Level of Consciousness: Drowsy;Quiet/awake Mood: Depressed Affect: Other (Comment);Unable to Assess Anxiety Level:  (UTA) Thought Processes: Irrelevant Judgement: Impaired Orientation: Unable to assess Obsessive Compulsive Thoughts/Behaviors:  (UTA)  Cognitive Functioning Concentration:  (UTA) Memory:  (UTA) IQ: Below Average Level of Function: BIF Insight: Poor Impulse Control: Poor Appetite:  (UTA) Weight Loss:  (UTA) Weight Gain:  (UTA) Sleep: Increased (sleeping in ED) Total Hours of Sleep:  (unknown) Vegetative Symptoms:  (UTA)  Prior Inpatient Therapy Prior Inpatient Therapy: Yes Prior Therapy Dates: 2009, 2010, 2011, 2012 Prior Therapy Facilty/Provider(s): [facility] Reason for Treatment: SI  Prior Outpatient Therapy Prior Outpatient Therapy: No Prior Therapy Dates: unknown Prior Therapy Facilty/Provider(s): unknown Reason for Treatment: unknown            Values / Beliefs Cultural Requests During Hospitalization: None Spiritual Requests During Hospitalization: None Consults Spiritual  Care Consult Needed: No Social Work Consult Needed: No   Nutrition Screen Diet: Regular (Psych Tray)  Additional Information 1:1 In Past 12 Months?: No CIRT Risk: No Elopement Risk: No Does patient have medical clearance?: Yes     Disposition:  Disposition Disposition of Patient: Referred to;Inpatient treatment program Type of inpatient treatment program: Adult Other disposition(s): Referred to outside facility Patient referred to: Other (Comment) (Pending [facility])  On Site Evaluation by:   Reviewed with Physician:  Kathreen Cosier, Rennis Harding 10/23/2011 2:23 PM

## 2011-10-23 NOTE — ED Notes (Signed)
Gave Sitter a bathroom break. 

## 2011-10-24 NOTE — ED Notes (Signed)
Security notified to take pt tp 4500 to shower.

## 2011-10-24 NOTE — ED Notes (Signed)
Pt taken to shower.

## 2011-10-24 NOTE — ED Notes (Signed)
Per Dr. Jacky Kindle,  Pt can be discharged to home.

## 2011-10-24 NOTE — ED Notes (Signed)
Pt back.  [facility] notified to page housekeeping to clean rm [phone] after pt shower.

## 2011-10-24 NOTE — ED Provider Notes (Signed)
BP 91/55  Pulse 76  Temp(Src) 97.3 F (36.3 C) (Oral)  Resp 14  SpO2 99%  LMP 10/15/2011 Pt stable at this time, no distress Awaiting placement (pending [facility] at this time per ACT recs)   Joya Gaskins, MD 10/24/11 [phone]

## 2011-10-24 NOTE — ED Notes (Signed)
Pt received reassessment with tele psychologist and per Dr. Rock Nephew being sent home. Pt very upset. Security at bedside.

## 2011-10-24 NOTE — ED Notes (Signed)
Dinner tray delivered.

## 2011-10-24 NOTE — ED Notes (Signed)
Received another call from Dr. Jacky Kindle, discussed pt's disposition.  States he will hold pt at this time.

## 2011-10-24 NOTE — ED Notes (Signed)
Dinner tray ordered, reg nonsharp 

## 2011-10-25 NOTE — ED Notes (Signed)
Diet tray ordered. Continues to sleep

## 2011-10-25 NOTE — BH Assessment (Signed)
Assessment Note  Update:  Completed [facility] referral form, completed phone referral with Junious Dresser at [facility] @ 0900, called [facility] and obtained authorization from [place] @ 0915 for [facility].  Faxed over referral to [facility].  Received call from [place] @ [phone] from [facility] stating pt was accepted to [facility] wait list.  Oncoming staff will need to follow up daily to ensure pt still on wait list.  Updated ED staff.      Disposition:  Disposition Disposition of Patient: Referred to;Inpatient treatment program Type of inpatient treatment program: Adult Other disposition(s): Referred to outside facility Patient referred to: [facility] (Pending [facility])  On Site Evaluation by:   Reviewed with Physician:  Pat Patrick, Rennis Harding 10/25/2011 10:25 AM

## 2011-10-25 NOTE — ED Notes (Signed)
Report received, assumed care.  

## 2011-10-25 NOTE — ED Notes (Addendum)
Patient currently sitting up in bed; no respiratory or acute distress noted.  Sitter present at bedside.  Patient requesting snacks; patient given [place] sandwich, pretzels, and sprite.  Patient has no other questions or concerns at this time; will continue to monitor.

## 2011-10-25 NOTE — ED Notes (Signed)
Received bedside report from [place].  Patient currently sitting up in bed; no respiratory or acute distress noted.  Sitter present at bedside.  Patient updated on plan of care; informed patient that she is currently pending placement at [facility], per ACT team.  Patient has no other questions or concerns at this time; will continue to monitor.

## 2011-10-25 NOTE — ED Notes (Signed)
Patient assisted to bathroom by sitter.

## 2011-10-25 NOTE — ED Provider Notes (Signed)
The patient presented with suicidal ideation.  Assessed by Tele psych.  Awaiting for inpatient placement at this time.  Pt eating breakfast.  She has no concerns this am.  Filed Vitals:   10/25/11 0629  BP: 101/64  Pulse: 72  Temp: 97.9 F (36.6 C)  Resp: 16    Car RRR.   Lung CTA  Continue to monitor.  ACT to re-eval.  Celene Kras, MD 10/25/11 [phone]

## 2011-10-25 NOTE — ED Notes (Signed)
Patient currently resting quietly in bed; no respiratory or acute distress noted.  Sitter present at bedside; denies needs at this time.  Will continue to monitor.

## 2011-10-25 NOTE — ED Notes (Signed)
Diinner tray ordered, reg nonsharp

## 2011-10-25 NOTE — ED Notes (Signed)
Patient currently sitting up in bed; no respiratory or acute distress noted.  Patient denies any needs at this time.  Sitter present at bedside.  Will continue to monitor.

## 2011-10-25 NOTE — ED Notes (Signed)
Dinner tray delivered.

## 2011-10-25 NOTE — ED Notes (Signed)
Informed patient and/or family of status. Awaiting acceptance into [facility]. No voiced complaints presently. NAD. Pt remains calm & cooperative.

## 2011-10-25 NOTE — BH Assessment (Signed)
Assessment Note   Yolanda [name] is an 36 y.o. female that was reassessed this day.  Pt will not deny that she wants to harm herself or still has a plan to harm self, but is unable to contract for safety.  Pt denies HI and psychosis.  Pt continues to minimize drug use.  Pt received telepsych yesterday and inpatient recommended, as pt exhibited similar behavior during telepsych.  Malingering is suspected by telepsychiatrist.  As pt has been declined at [place] and [facility], [facility] referral will be initiated.  Pt is now IVC per telepsych recommendation.  Completed reassessment, assessment notification and faxed to [facility] to log.  Updated ED staff.  Previous Note:  Yolanda [name] is an 36 y.o. female that was reassessed this day. Pt was drowsy, did admit to continued SI with plan to use a razor to cut her wrist if she was discharged from ED. Pt denies HI or psychosis. Pt denies drug use, although admitted to using cocaine and THC in the past. Writer had to continue to wake pt to answer questions. Pt had a telepsych that recommended inpatient treatment. Pt was declined by [facility]. Pt was declined by [facility] by NP Lynann Bologna, due to pt's developmental disability as well as the belief that pt is presenting at her baseline and does not exhibit any acute psychiatric symptoms. Called Chris at [facility] and beds available per Ward @ 1410. Faxed over referral for review. Completed reassessment, assessment notification and faxed to [facility] to log. Udpated ED staff.   Axis I: Depressive Disorder NOS Axis II: Deferred Axis III:  Past Medical History  Diagnosis Date  . Depression    Axis IV: economic problems, housing problems, other psychosocial or environmental problems, problems related to social environment and problems with primary support group Axis V: 31-40 impairment in reality testing  Past Medical History:  Past Medical History  Diagnosis Date  . Depression     Past Surgical History  Procedure Date   . Salpingectomy   . No past surgeries     Family History: No family history on file.  Social History:  reports that she has been smoking.  She does not have any smokeless tobacco history on file. She reports that she drinks alcohol. She reports that she uses illicit drugs (Cocaine and Marijuana).  Additional Social History:    Allergies: No Known Allergies  Home Medications:  Medications Prior to Admission  Medication Dose Route Frequency Provider Last Rate Last Dose  . acetaminophen (TYLENOL) tablet 650 mg  650 mg Oral Q4H PRN April K Palumbo-Rasch, MD      . alum & mag hydroxide-simeth (MAALOX/MYLANTA) 200-200-20 MG/5ML suspension 30 mL  30 mL Oral PRN April K Palumbo-Rasch, MD      . ibuprofen (ADVIL,MOTRIN) tablet 600 mg  600 mg Oral Q8H PRN April K Palumbo-Rasch, MD      . LORazepam (ATIVAN) tablet 1 mg  1 mg Oral Q8H PRN Juliet Rude. Pickering, MD   1 mg at 10/24/11 2220  . mirtazapine (REMERON) tablet 15 mg  15 mg Oral QHS Nathan R. Pickering, MD   15 mg at 10/24/11 2220  . nicotine (NICODERM CQ - dosed in mg/24 hours) 21 mg/24hr patch           . nicotine (NICODERM CQ - dosed in mg/24 hours) patch 21 mg  21 mg Transdermal Once Arley Phenix, MD      . ondansetron [facility]) tablet 4 mg  4 mg  Oral Q8H PRN April K Palumbo-Rasch, MD       Medications Prior to Admission  Medication Sig Dispense Refill  . mirtazapine (REMERON) 15 MG tablet Take 15 mg by mouth at bedtime.        OB/GYN Status:  Patient's last menstrual period was 10/15/2011.  General Assessment Data Location of Assessment: [facility] ED ACT Assessment: Yes Living Arrangements: Other (Comment) (pt stated she lives where she can) Can pt return to current living arrangement?: Yes Admission Status: Involuntary Is patient capable of signing voluntary admission?: Yes Transfer from: Acute Hospital Referral Source: Self/Family/Friend  Education Status Is patient currently in school?: No  Risk to self Suicidal Ideation:  Yes-Currently Present Suicidal Intent: No Is patient at risk for suicide?: Yes Suicidal Plan?: No Specify Current Suicidal Plan: pt denies currently Access to Means: Yes Specify Access to Suicidal Means: pt threatened to cut with razor, can find razor What has been your use of drugs/alcohol within the last 12 months?: pt denies, although has recent history of drug use Previous Attempts/Gestures: Yes How many times?: 3  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, drug use Triggers for Past Attempts: Unpredictable;Unknown Intentional Self Injurious Behavior: None (pt denies) Family Suicide History: Unknown Recent stressful life event(s): Other (Comment) (pt homeless, stated she could not get her medication) Persecutory voices/beliefs?: No Depression: Yes Depression Symptoms: Despondent;Fatigue;Feeling worthless/self pity Substance abuse history and/or treatment for substance abuse?: Yes Suicide prevention information given to non-admitted patients: Not applicable  Risk to Others Homicidal Ideation: No Thoughts of Harm to Others: No Current Homicidal Intent: No Current Homicidal Plan: No Access to Homicidal Means: No Identified Victim: na History of harm to others?: No Assessment of Violence: None Noted Violent Behavior Description: na - pt cooperative Does patient have access to weapons?:  (UTA) Criminal Charges Pending?:  (UTA) Does patient have a court date:  (UTA)  Psychosis Hallucinations: None noted Delusions: None noted  Mental Status Report Appear/Hygiene: Disheveled Eye Contact: Fair Motor Activity: Unremarkable Speech: Slurred;Incoherent;Soft Level of Consciousness: Drowsy;Quiet/awake Mood: Ambivalent Affect: Other (Comment) (ambivalent) Anxiety Level:  (UTA) Thought Processes: Irrelevant Judgement: Impaired Orientation: Unable to assess Obsessive Compulsive Thoughts/Behaviors:  (UTA)  Cognitive Functioning Concentration:  (UTA) Memory:  (UTA) IQ: Below  Average Level of Function: BIF Insight: Poor Impulse Control: Poor Appetite: Good (pt eating in ED) Weight Loss:  (UTA) Weight Gain:  (UTA) Sleep: Increased Total Hours of Sleep:  (unknown) Vegetative Symptoms:  (unknown)  Prior Inpatient Therapy Prior Inpatient Therapy: Yes Prior Therapy Dates: 2009, 2010, 2011, 2012 Prior Therapy Facilty/Provider(s): [facility] Reason for Treatment: SI  Prior Outpatient Therapy Prior Outpatient Therapy: No Prior Therapy Dates: unknown Prior Therapy Facilty/Provider(s): unknown Reason for Treatment: unknown  ADL Screening (condition at time of admission) Patient's cognitive ability adequate to safely complete daily activities?: Yes Patient able to express need for assistance with ADLs?: Yes Independently performs ADLs?: Yes  Home Assistive Devices/Equipment Home Assistive Devices/Equipment: None      Values / Beliefs Cultural Requests During Hospitalization: None Spiritual Requests During Hospitalization: None Consults Spiritual Care Consult Needed: No Social Work Consult Needed: No   Nutrition Screen Diet: Regular (Psych Tray)  Additional Information 1:1 In Past 12 Months?: No CIRT Risk: No Elopement Risk: No Does patient have medical clearance?: Yes     Disposition:  Disposition Disposition of Patient: Referred to;Inpatient treatment program Type of inpatient treatment program: Adult Other disposition(s): Referred to outside facility Patient referred to: [facility] ([facility]ending [facility])  On Site Evaluation by:  Reviewed with Physician:  Pat Patrick, Rennis Harding 10/25/2011 8:18 AM

## 2011-10-26 NOTE — ED Notes (Signed)
Pt provided sprite, cheese and crackers. Bed linens changed. Pt denying any needs at this time. Responding appropriately. Making appropriate eye contact. NAD

## 2011-10-26 NOTE — ED Notes (Signed)
Pt taken upstair with sitter and security for shower.

## 2011-10-26 NOTE — ED Notes (Signed)
Pt came to the ED for SI. She is currently not experiencing any suicide thoughts or plan. Sitter is at bedside. Will continue to monitor.

## 2011-10-26 NOTE — ED Notes (Signed)
Pt walking with sitter independently; using phone. Calm, cooperative

## 2011-10-26 NOTE — ED Notes (Addendum)
Pt ambulated to bathroom independently with sitter. Pt offered shower, reporting would like one. Secretary notified, setting up process. Pt alert, reporting "I feel fine". Denying any complaints. NAD. Lunch tray arrived.

## 2011-10-26 NOTE — ED Notes (Signed)
Pt tearful, reporting missing family and things. Pt reporting feeling anxious requesting medication.

## 2011-10-26 NOTE — ED Notes (Signed)
Dinner tray delivered.

## 2011-10-26 NOTE — ED Notes (Signed)
Dinner tray ordered, reg nonsharp 

## 2011-10-27 NOTE — ED Notes (Signed)
Received bedside report from Ed, Charity fundraiser.  Patient just returned from 4500 (for shower); escorted by Producer, television/film/video.  Introduced self to patient and updated whiteboard in room; patient has no questions or concerns at this time.  Patient has already eaten dinner for the night.  Sitter present at bedside.  Will continue to monitor.

## 2011-10-27 NOTE — ED Notes (Signed)
Patient tearful; states that she wants to speak with a counselor and that she is tired of being here and doing the same thing every day; patient requesting salad for tomorrow's lunch.  ACT team notified that patient wants to speak with him; ACT team on way to speak with patient.  Sitter present at bedside.  Will continue to monitor.

## 2011-10-27 NOTE — ED Notes (Addendum)
Patient given sprite, saltine crackers, and cheese, per patient request.  Patient currently sitting up in bed; no respiratory or acute distress noted.  Sitter present at bedside.  Will continue to monitor.

## 2011-10-27 NOTE — ED Notes (Signed)
Pt. Determined to go home and threatening staff that she will leave regardless of cops and security presence.  Security and police at bedside and reinforced the purpose of IVC; pt. States, "I did not comit myself here."

## 2011-10-27 NOTE — ED Notes (Addendum)
Pt is sitting with Ihor Gully from the ACT Team:  Pt reports that she has not been updated on her status.  Pt was told that her telepsych recommended that she be admitted for inpatient treatment.  Pt states she does not want to stay here and is requesting another telepsych.  She denies any thoughts of SI/HI at this times and reports that she has never had the desire to harm herself or others.

## 2011-10-27 NOTE — ED Notes (Signed)
Patient currently lying in bed; no respiratory or acute distress noted.  Patient requesting something to help her sleep; informed patient that she has PRN orders for Ativan; patient states that she would think about it.  Sitter present at bedside.  Patient has no other questions or concerns at this time; will continue to monitor.

## 2011-10-27 NOTE — ED Notes (Signed)
Pt. Cooperative. No further comments.

## 2011-10-27 NOTE — ED Provider Notes (Signed)
BP 96/54  Pulse 87  Temp(Src) 98.4 F (36.9 C) (Oral)  Resp 18  SpO2 98%  LMP 10/15/2011  Patient seen and evaluated by me. No complaints at this time.  On wait list at [facility].   Forbes Cellar, MD 10/27/11 [phone]

## 2011-10-27 NOTE — ED Notes (Signed)
Pt. 4500 for shower - security and sitter with pt. Pt. Taking a shower in 4565.

## 2011-10-28 NOTE — BH Assessment (Addendum)
Assessment Note   Yolanda [name] is an 36 y.o. female.  Yolanda [name] is currently agitated and wanting to know what is going on with her case.  She was informed that she is on IVC and is on the wait list for [facility].  Clinician also told her that other placements were attempted and that she was declined.  When asked if she had been suicidal she denies this.  She reports "I never had a thought about hurting myself."  Yolanda [name] denies any HI or A/V hallucinations.  This clinician told her that she had been discharged the week before last after having been released by telepsychiatrist and she came back within one week.  Yolanda [name] said that she had come to [facility] for this current event because she needed a prescription and denies having any SI.  Yolanda [name] is currently denying any HI or A/V hallucinations either.  It should be noted that Yolanda [name] continues on the wait list for [facility] and is currently on IVC.   Yolanda [name] was given a telepsych consult with Dr. Gaye Alken.  Dr. Rob Bunting recommends discharge and referral for outpatient SA tx .  Pt was given referrals for outpatient substance abuse tx.  Previous Note: Yolanda [name] is an 36 y.o. female that was reassessed this day. Pt will not deny that she wants to harm herself or still has a plan to harm self, but is unable to contract for safety. Pt denies HI and psychosis. Pt continues to minimize drug use. Pt received telepsych yesterday and inpatient recommended, as pt exhibited similar behavior during telepsych. Malingering is suspected by telepsychiatrist. As pt has been declined at [place] and [facility], [facility] referral will be initiated. Pt is now IVC per telepsych recommendation. Completed reassessment, assessment notification and faxed to [facility] to log. Updated ED staff.   Previous Note: Yolanda [name] is an 36 y.o. female that was reassessed this day. Pt was drowsy, did admit to continued SI with plan to use a razor to cut her wrist if she was discharged from ED. Pt denies  HI or psychosis. Pt denies drug use, although admitted to using cocaine and THC in the past. Writer had to continue to wake pt to answer questions. Pt had a telepsych that recommended inpatient treatment. Pt was declined by [facility]. Pt was declined by [facility] by NP Lynann Bologna, due to pt's developmental disability as well as the belief that pt is presenting at her baseline and does not exhibit any acute psychiatric symptoms. Called Chris at [facility] and beds available per Casanova @ 1410. Faxed over referral for review. Completed reassessment, assessment notification and faxed to [facility] to log. Udpated ED staff.  Axis I: Depressive Disorder NOS Axis II: Deferred Axis III:  Past Medical History  Diagnosis Date  . Depression    Axis IV: occupational problems and other psychosocial or environmental problems Axis V: 31-40 impairment in reality testing  Past Medical History:  Past Medical History  Diagnosis Date  . Depression     Past Surgical History  Procedure Date  . Salpingectomy   . No past surgeries     Family History: No family history on file.  Social History:  reports that she has been smoking.  She does not have any smokeless tobacco history on file. She reports that she drinks alcohol. She reports that she uses illicit drugs (Cocaine and Marijuana).  Additional Social History:    Allergies: No Known Allergies  Home Medications:  Medications Prior to Admission  Medication Dose Route Frequency Provider Last Rate Last Dose  . acetaminophen (TYLENOL) tablet 650 mg  650 mg Oral Q4H PRN April K Palumbo-Rasch, MD      . alum & mag hydroxide-simeth (MAALOX/MYLANTA) 200-200-20 MG/5ML suspension 30 mL  30 mL Oral PRN April K Palumbo-Rasch, MD      . ibuprofen (ADVIL,MOTRIN) tablet 600 mg  600 mg Oral Q8H PRN April K Palumbo-Rasch, MD      . LORazepam (ATIVAN) tablet 1 mg  1 mg Oral Q8H PRN Juliet Rude. Pickering, MD   1 mg at 10/27/11 2158  . mirtazapine (REMERON) tablet 15 mg  15  mg Oral QHS Nathan R. Pickering, MD   15 mg at 10/27/11 2158  . nicotine (NICODERM CQ - dosed in mg/24 hours) 21 mg/24hr patch           . nicotine (NICODERM CQ - dosed in mg/24 hours) patch 21 mg  21 mg Transdermal Once Arley Phenix, MD      . ondansetron [facility]) tablet 4 mg  4 mg Oral Q8H PRN April K Palumbo-Rasch, MD       Medications Prior to Admission  Medication Sig Dispense Refill  . mirtazapine (REMERON) 15 MG tablet Take 15 mg by mouth at bedtime.        OB/GYN Status:  Patient's last menstrual period was 10/15/2011.  General Assessment Data Location of Assessment: [facility] ED ACT Assessment: Yes Living Arrangements: Other (Comment) (Pt states that she lives where she can) Can pt return to current living arrangement?: Yes Admission Status: Involuntary Is patient capable of signing voluntary admission?: Yes Transfer from: Acute Hospital Referral Source: Self/Family/Friend  Education Status Is patient currently in school?: No  Risk to self Suicidal Ideation: Yes-Currently Present Suicidal Intent: No (Pt currently denies currently wanting to harm self) Is patient at risk for suicide?: Yes Suicidal Plan?: No (Had earlier said that she would cut her wrists) Specify Current Suicidal Plan: Pt denies but earlier said she would cut wrists Access to Means: Yes Specify Access to Suicidal Means: [place] What has been your use of drugs/alcohol within the last 12 months?: Pt denies but has hx  Previous Attempts/Gestures: Yes How many times?: 3  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 & drug use Triggers for Past Attempts: Unpredictable;Unknown Intentional Self Injurious Behavior: None Family Suicide History: Unknown Recent stressful life event(s): Other (Comment) (Pt homeless and says she cannot get meds) Persecutory voices/beliefs?: No Depression: Yes Depression Symptoms: Feeling worthless/self pity;Despondent Substance abuse history and/or treatment for substance abuse?:  Yes Suicide prevention information given to non-admitted patients: Not applicable  Risk to Others Homicidal Ideation: No Thoughts of Harm to Others: No Current Homicidal Intent: No Current Homicidal Plan: No Access to Homicidal Means: No Identified Victim: None History of harm to others?: No Assessment of Violence: None Noted Violent Behavior Description: Patient is agitated but cooperative Does patient have access to weapons?: No Criminal Charges Pending?: No Does patient have a court date: No  Psychosis Hallucinations: None noted Delusions: None noted  Mental Status Report Appear/Hygiene: Disheveled Eye Contact: Fair Motor Activity: Unremarkable Speech: Slurred;Incoherent;Soft Level of Consciousness: Drowsy;Quiet/awake Mood: Depressed;Anxious Affect: Anxious Anxiety Level: Minimal Thought Processes: Coherent Judgement: Impaired Orientation: Person;Place;Time;Situation Obsessive Compulsive Thoughts/Behaviors: None  Cognitive Functioning Concentration: Decreased Memory: Recent Impaired;Remote Intact IQ: Below Average Level of Function: Borderline Intellectual Functioning Insight: Poor Impulse Control: Poor Appetite: Good Weight Loss: 0  Weight Gain: 0  Sleep: Increased Total Hours of Sleep:  (Unknown) Vegetative Symptoms:  None  Prior Inpatient Therapy Prior Inpatient Therapy: Yes Prior Therapy Dates: 2009, 2010, 2011, 2012 Prior Therapy Facilty/Provider(s): [facility] Reason for Treatment: SI  Prior Outpatient Therapy Prior Outpatient Therapy: No Prior Therapy Dates: unknown Prior Therapy Facilty/Provider(s): unknown Reason for Treatment: unknown  ADL Screening (condition at time of admission) Patient's cognitive ability adequate to safely complete daily activities?: Yes Patient able to express need for assistance with ADLs?: Yes Independently performs ADLs?: Yes  Home Assistive Devices/Equipment Home Assistive Devices/Equipment: None      Values /  Beliefs Cultural Requests During Hospitalization: None Spiritual Requests During Hospitalization: None Consults Spiritual Care Consult Needed: No Social Work Consult Needed: No   Nutrition Screen Diet: Regular (Psych Tray)  Additional Information 1:1 In Past 12 Months?: No CIRT Risk: No Elopement Risk: No Does patient have medical clearance?: Yes     Disposition:  Disposition Disposition of Patient: Referred to;Inpatient treatment program Type of inpatient treatment program: Adult Other disposition(s): Referred to outside facility Patient referred to: [facility]  On Site Evaluation by:   Reviewed with Physician:     Beatriz Stallion Ray 10/28/2011 12:16 AM

## 2011-10-28 NOTE — Discharge Instructions (Signed)
Followup as directed by the act team.  Return for thoughts of harming yourself or others.

## 2011-10-28 NOTE — ED Notes (Signed)
Personal items were removed from locker and given back to patient.  Pt stated she wanted to stay until the morning.  I explained to pt that she has been discharged and she is welcome to wait in the lobby.  I offered pt a bus pass and pt refused.

## 2011-10-28 NOTE — ED Provider Notes (Signed)
Telepsych consult completed.  Dr. Henderson Cloud does not think pt is suicidal.  He thinks she can be released.   Cheri Guppy, MD 10/28/11 [phone]

## 2012-09-18 ENCOUNTER — Emergency Department (HOSPITAL_BASED_OUTPATIENT_CLINIC_OR_DEPARTMENT_OTHER)
Admission: EM | Admit: 2012-09-18 | Discharge: 2012-09-18 | Disposition: A | Discharge status: Home / Self Care | Payer: Medicaid Other | Attending: Emergency Medicine | Admitting: Emergency Medicine

## 2012-09-18 ENCOUNTER — Encounter (HOSPITAL_BASED_OUTPATIENT_CLINIC_OR_DEPARTMENT_OTHER): Payer: Self-pay | Admitting: *Deleted

## 2012-09-18 DIAGNOSIS — Z3202 Encounter for pregnancy test, result negative: Secondary | ICD-10-CM | POA: Insufficient documentation

## 2012-09-18 DIAGNOSIS — R6889 Other general symptoms and signs: Secondary | ICD-10-CM | POA: Insufficient documentation

## 2012-09-18 DIAGNOSIS — F3289 Other specified depressive episodes: Secondary | ICD-10-CM | POA: Insufficient documentation

## 2012-09-18 DIAGNOSIS — Z79899 Other long term (current) drug therapy: Secondary | ICD-10-CM | POA: Insufficient documentation

## 2012-09-18 DIAGNOSIS — F329 Major depressive disorder, single episode, unspecified: Secondary | ICD-10-CM | POA: Insufficient documentation

## 2012-09-18 DIAGNOSIS — F17200 Nicotine dependence, unspecified, uncomplicated: Secondary | ICD-10-CM | POA: Insufficient documentation

## 2012-09-18 DIAGNOSIS — N951 Menopausal and female climacteric states: Secondary | ICD-10-CM | POA: Insufficient documentation

## 2012-09-18 DIAGNOSIS — R232 Flushing: Secondary | ICD-10-CM

## 2012-09-18 LAB — URINALYSIS, ROUTINE W REFLEX MICROSCOPIC
Leukocytes, UA: NEGATIVE
Nitrite: NEGATIVE
Protein, ur: NEGATIVE mg/dL
Urobilinogen, UA: 1 mg/dL (ref 0.0–1.0)

## 2012-09-18 LAB — BASIC METABOLIC PANEL
Chloride: 107 mEq/L (ref 96–112)
Creatinine, Ser: 0.8 mg/dL (ref 0.50–1.10)
GFR calc Af Amer: >90 mL/min (ref >90)
GFR calc non Af Amer: >90 mL/min (ref >90)
Potassium: 4.7 mEq/L (ref 3.5–5.1)

## 2012-09-18 LAB — CBC WITH DIFFERENTIAL/PLATELET
Basophils Absolute: 0 10*3/uL (ref 0.0–0.1)
Basophils Relative: 0 % (ref 0–1)
MCHC: 33.5 g/dL (ref 30.0–36.0)
Neutro Abs: 3.7 10*3/uL (ref 1.7–7.7)
Neutrophils Relative %: 57 % (ref 43–77)
RDW: 12.5 % (ref 11.5–15.5)

## 2012-09-18 LAB — URINE MICROSCOPIC-ADD ON

## 2012-09-18 LAB — PREGNANCY, URINE: Preg Test, Ur: NEGATIVE

## 2012-09-18 LAB — TSH: TSH: 0.875 u[IU]/mL (ref 0.350–4.500)

## 2012-09-18 NOTE — ED Notes (Signed)
Chills x 2 weeks. Cough with clear phlegm. Denies pain. Denies fever.

## 2012-09-18 NOTE — ED Provider Notes (Signed)
History     CSN: [id number]  Arrival date & time 09/18/12  1158   First MD Initiated Contact with Patient 09/18/12 1215      Chief Complaint  Patient presents with  . Chills    (Consider location/radiation/quality/duration/timing/severity/associated sxs/prior treatment) HPI Comments: Patient is a 37 year old female with a history of depression who presents for episodes of hot and cold sweats. Patient states she has been having hot and cold sweats x2 weeks, having approximately 2 episodes per day lasting "a few minutes". Patient denies aggravating alleviating factors of these episodes. She admits to associated frontal headaches approximately twice per week, however states that the timing of these headaches is not associated with an episode of hot or cold sweats. Patient denies recent fevers, vision changes, tinnitus, lightheadedness, dizziness, syncope, chest pain, shortness of breath, nausea, vomiting, diarrhea, and abdominal pain. Patient's last menstrual period ended one week ago. She does not have a primary care physician but does have an OB/GYN who is Dr. Shawnie Pons. Patient states she is sexually active with one partner who is her fianc. She states they use condoms during sexual intercourse. She denies being on any other form of birth control or taking hormone replacement. She denies a history of thyroid disease.  The history is provided by the patient. No language interpreter was used.    Past Medical History  Diagnosis Date  . Depression     Past Surgical History  Procedure Laterality Date  . Salpingectomy    . No past surgeries      No family history on file.  History  Substance Use Topics  . Smoking status: Current Every Day Smoker  . Smokeless tobacco: Not on file  . Alcohol Use: Yes    OB History   Grav Para Term Preterm Abortions TAB SAB Ect Mult Living   5 2 2  0 2  1 1  0 2      Review of Systems  Constitutional: Negative for fever and chills.  HENT: Negative  for ear pain and tinnitus.   Eyes: Negative for visual disturbance.  Respiratory: Negative for chest tightness and shortness of breath.   Cardiovascular: Negative for chest pain.  Gastrointestinal: Negative for nausea, vomiting, abdominal pain, diarrhea and blood in stool.  Genitourinary: Negative for dysuria, hematuria, vaginal bleeding and vaginal discharge.  Musculoskeletal: Negative for back pain.  Skin: Negative for color change.  Neurological: Negative for syncope, weakness, numbness and headaches.  All other systems reviewed and are negative.    Allergies  Review of patient's allergies indicates no known allergies.  Home Medications   Current Outpatient Rx  Name  Route  Sig  Dispense  Refill  . ARIPiprazole (ABILIFY PO)   Oral   Take by mouth.         . EXPIRED: mirtazapine (REMERON) 15 MG tablet   Oral   Take 15 mg by mouth at bedtime.           BP 106/73  Pulse 73  Temp(Src) 98.2 F (36.8 C) (Oral)  Resp 16  SpO2 100%  LMP 08/21/2012  Physical Exam  Nursing note and vitals reviewed. Constitutional: She is oriented to person, place, and time. She appears well-developed and well-nourished. No distress.  Patient is in no acute distress sitting on the bed comfortably playing Candy Crush on her phone  HENT:  Head: Normocephalic and atraumatic.  Right Ear: External ear normal.  Left Ear: External ear normal.  Mouth/Throat: Oropharynx is clear and moist. No oropharyngeal exudate.  Eyes: Conjunctivae and EOM are normal. Pupils are equal, round, and reactive to light. Right eye exhibits no discharge. Left eye exhibits no discharge. No scleral icterus.  Neck: Normal range of motion. Neck supple. No thyromegaly present.  Cardiovascular: Normal rate, regular rhythm, normal heart sounds and intact distal pulses.   No murmur heard. Pulmonary/Chest: Effort normal and breath sounds normal. No respiratory distress. She has no wheezes. She has no rales.  Abdominal:  Soft. Bowel sounds are normal. She exhibits no distension and no mass. There is no tenderness. There is no rebound and no guarding.  Musculoskeletal: Normal range of motion. She exhibits no edema.  Lymphadenopathy:    She has no cervical adenopathy.  Neurological: She is alert and oriented to person, place, and time.  Skin: Skin is warm and dry. No rash noted. She is not diaphoretic. No erythema.  Psychiatric: She has a normal mood and affect. Her behavior is normal.    ED Course  Procedures (including critical care time)  Labs Reviewed  URINALYSIS, ROUTINE W REFLEX MICROSCOPIC - Abnormal; Notable for the following:    APPearance CLOUDY (*)    Hgb urine dipstick MODERATE (*)    All other components within normal limits  URINE MICROSCOPIC-ADD ON - Abnormal; Notable for the following:    Squamous Epithelial / LPF MANY (*)    Bacteria, UA FEW (*)    All other components within normal limits  PREGNANCY, URINE  CBC WITH DIFFERENTIAL  BASIC METABOLIC PANEL  TSH   No results found.   1. Hot flashes      MDM  Patient with a history of depression presents for episodes of hot and cold flashes x2 weeks lasting a few minutes per episode. Patient is well-appearing, in no acute distress, and sitting on the bed comfortably playing Candy Crush on her cell phone. Patient's workup to include CBC, BMP, urine and urine pregnancy test, and TSH level.  Patient's urine pregnancy negative. Urine contaminated without evidence of urinary tract infection and CBC without evidence of anemia or leukocytosis; kidney function normal and TSH pending. Patient's VSS and she will be d/c home with PCP follow up. Patient told to follow up regarding TSH levels and given indications for ED return. Patient states comfort and understanding with this d/c plan. Patient seen by Dr. Judd Lien with whom this work up and management plan was discussed.  Filed Vitals:   09/18/12 1207 09/18/12 1424  BP: 121/75 106/73  Pulse: 110  73  Temp: 98.1 F (36.7 C) 98.2 F (36.8 C)  TempSrc: Oral Oral  Resp: 20 16  SpO2: 100% 100%           Antony Madura, PA-C 09/19/12 2235

## 2012-09-20 NOTE — ED Provider Notes (Signed)
Medical screening examination/treatment/procedure(s) were conducted as a shared visit with non-physician practitioner(s) and myself.  I personally evaluated the patient during the encounter.  The patient presents with complaints of chills, feeling hot then cold for the past two weeks.  No fever and denies any cough, n/v/d.    On exam, the patient is afebrile and the vitals are stable.  The heart and lung exam is unremarkable and the abdomen is benign.  The PO is clear as are tm's.  There is no thyromegaly and no cervical adenopathy.    Workup includes cbc and bmp which shows no leukocytosis and normal sugar and electrolytes.  TSH is pending and will not result until tomorrow.  Appears stable for discharge, to follow up with pcp if not improving.  Unsure of etiology of symptoms.  Does not appear infectious.  Is possible hormonal, awaiting TSH.  Geoffery Lyons, MD 09/20/12 [phone]

## 2014-05-10 ENCOUNTER — Encounter (HOSPITAL_BASED_OUTPATIENT_CLINIC_OR_DEPARTMENT_OTHER): Payer: Self-pay | Admitting: *Deleted

## 2017-08-22 DIAGNOSIS — B009 Herpesviral infection, unspecified: Secondary | ICD-10-CM

## 2017-08-22 HISTORY — DX: Herpesviral infection, unspecified: B00.9

## 2017-11-06 ENCOUNTER — Ambulatory Visit: Payer: Self-pay | Admitting: Nurse Practitioner

## 2018-06-03 DIAGNOSIS — K219 Gastro-esophageal reflux disease without esophagitis: Secondary | ICD-10-CM | POA: Diagnosis present

## 2019-11-13 DIAGNOSIS — F251 Schizoaffective disorder, depressive type: Principal | ICD-10-CM | POA: Diagnosis present

## 2019-11-16 DIAGNOSIS — Z91148 Patient's other noncompliance with medication regimen for other reason: Secondary | ICD-10-CM | POA: Insufficient documentation

## 2019-11-21 ENCOUNTER — Encounter (HOSPITAL_COMMUNITY): Payer: Self-pay | Admitting: Emergency Medicine

## 2019-11-21 ENCOUNTER — Emergency Department (HOSPITAL_COMMUNITY)
Admission: EM | Admit: 2019-11-21 | Discharge: 2019-11-22 | Disposition: A | Discharge status: Home / Self Care | Payer: Medicaid Other | Attending: Emergency Medicine | Admitting: Emergency Medicine

## 2019-11-21 ENCOUNTER — Other Ambulatory Visit: Payer: Self-pay

## 2019-11-21 DIAGNOSIS — F319 Bipolar disorder, unspecified: Secondary | ICD-10-CM | POA: Insufficient documentation

## 2019-11-21 DIAGNOSIS — F4325 Adjustment disorder with mixed disturbance of emotions and conduct: Secondary | ICD-10-CM | POA: Diagnosis present

## 2019-11-21 DIAGNOSIS — F22 Delusional disorders: Secondary | ICD-10-CM | POA: Diagnosis not present

## 2019-11-21 DIAGNOSIS — R45851 Suicidal ideations: Secondary | ICD-10-CM | POA: Diagnosis not present

## 2019-11-21 DIAGNOSIS — F17200 Nicotine dependence, unspecified, uncomplicated: Secondary | ICD-10-CM | POA: Insufficient documentation

## 2019-11-21 DIAGNOSIS — F329 Major depressive disorder, single episode, unspecified: Secondary | ICD-10-CM | POA: Diagnosis present

## 2019-11-21 DIAGNOSIS — F209 Schizophrenia, unspecified: Secondary | ICD-10-CM | POA: Insufficient documentation

## 2019-11-21 DIAGNOSIS — Z79899 Other long term (current) drug therapy: Secondary | ICD-10-CM | POA: Insufficient documentation

## 2019-11-21 LAB — CBC WITH DIFFERENTIAL/PLATELET
Abs Immature Granulocytes: 0.01 10*3/uL (ref 0.00–0.07)
Basophils Absolute: 0 10*3/uL (ref 0.0–0.1)
Basophils Relative: 1 %
Eosinophils Absolute: 0.3 10*3/uL (ref 0.0–0.5)
Eosinophils Relative: 4 %
HCT: 36.8 % (ref 36.0–46.0)
Hemoglobin: 11.6 g/dL — ABNORMAL LOW (ref 12.0–15.0)
Immature Granulocytes: 0 %
Lymphocytes Relative: 41 %
Lymphs Abs: 3 10*3/uL (ref 0.7–4.0)
MCH: 28.9 pg (ref 26.0–34.0)
MCHC: 31.5 g/dL (ref 30.0–36.0)
MCV: 91.8 fL (ref 80.0–100.0)
Monocytes Absolute: 0.4 10*3/uL (ref 0.1–1.0)
Monocytes Relative: 6 %
Neutro Abs: 3.6 10*3/uL (ref 1.7–7.7)
Neutrophils Relative %: 48 %
Platelets: 299 10*3/uL (ref 150–400)
RBC: 4.01 MIL/uL (ref 3.87–5.11)
RDW: 12.9 % (ref 11.5–15.5)
WBC: 7.5 10*3/uL (ref 4.0–10.5)
nRBC: 0 % (ref 0.0–0.2)

## 2019-11-21 LAB — RAPID URINE DRUG SCREEN, HOSP PERFORMED
Amphetamines: NOT DETECTED
Barbiturates: NOT DETECTED
Benzodiazepines: NOT DETECTED
Cocaine: NOT DETECTED
Opiates: NOT DETECTED
Tetrahydrocannabinol: NOT DETECTED

## 2019-11-21 LAB — COMPREHENSIVE METABOLIC PANEL
ALT: 26 U/L (ref 0–44)
AST: 21 U/L (ref 15–41)
Albumin: 3.8 g/dL (ref 3.5–5.0)
Alkaline Phosphatase: 72 U/L (ref 38–126)
Anion gap: 7 (ref 5–15)
BUN: 6 mg/dL (ref 6–20)
CO2: 25 mmol/L (ref 22–32)
Calcium: 8.4 mg/dL — ABNORMAL LOW (ref 8.9–10.3)
Chloride: 104 mmol/L (ref 98–111)
Creatinine, Ser: 0.79 mg/dL (ref 0.44–1.00)
GFR calc Af Amer: >60 mL/min (ref >60)
GFR calc non Af Amer: >60 mL/min (ref >60)
Glucose, Bld: 89 mg/dL (ref 70–99)
Potassium: 4.1 mmol/L (ref 3.5–5.1)
Sodium: 136 mmol/L (ref 135–145)
Total Bilirubin: 0.6 mg/dL (ref 0.3–1.2)
Total Protein: 7.3 g/dL (ref 6.5–8.1)

## 2019-11-21 LAB — SALICYLATE LEVEL: Salicylate Lvl: <7 mg/dL — ABNORMAL LOW (ref 7.0–30.0)

## 2019-11-21 LAB — ACETAMINOPHEN LEVEL: Acetaminophen (Tylenol), Serum: <10 ug/mL — ABNORMAL LOW (ref 10–30)

## 2019-11-21 LAB — ETHANOL: Alcohol, Ethyl (B): <10 mg/dL (ref <10)

## 2019-11-21 MED ORDER — FLUOXETINE HCL 40 MG PO CAPS: 40.0000 mg | ORAL_CAPSULE | Freq: Every day | ORAL | Status: DC

## 2019-11-21 MED ORDER — BENZTROPINE MESYLATE 1 MG PO TABS
1.0000 mg | ORAL_TABLET | Freq: Two times a day (BID) | ORAL | Status: DC
  Administered 2019-11-22: 1 mg via ORAL
  Filled 2019-11-21: qty 1

## 2019-11-21 MED ORDER — FLUOXETINE HCL 20 MG PO CAPS
40.0000 mg | ORAL_CAPSULE | Freq: Every day | ORAL | Status: DC
  Administered 2019-11-22: 40 mg via ORAL
  Filled 2019-11-21: qty 2

## 2019-11-21 MED ORDER — BREXPIPRAZOLE 1 MG PO TABS
1.0000 mg | ORAL_TABLET | Freq: Two times a day (BID) | ORAL | Status: DC
  Administered 2019-11-22: 1 mg via ORAL
  Filled 2019-11-21 (×2): qty 1

## 2019-11-21 MED ORDER — GABAPENTIN 300 MG PO CAPS
300.0000 mg | ORAL_CAPSULE | Freq: Three times a day (TID) | ORAL | Status: DC
  Administered 2019-11-22: 300 mg via ORAL
  Filled 2019-11-21: qty 1

## 2019-11-21 NOTE — ED Notes (Signed)
Tried to get blood but was unsuccessful 

## 2019-11-21 NOTE — ED Provider Notes (Signed)
[place] [facility] DEPT Provider Note   CSN: [id number] Arrival date & time: 11/21/19  1639     History Chief Complaint  Patient presents with  . Suicidal    Yolanda [name] is a 44 y.o. female.  44 year old female presents with complaint of feeling suicidal, states she does not want to be here anymore.  Patient was recently discharged from [facility] after behavioral health admission and stabilization.  Patient has a large wound to her left wrist, healing by secondary intention due to delay in seeking care, has new wound to her right wrist, she is unable to state when this happened, does not appear to have occurred in the past 24 hours.  Patient is a difficult historian, no other complaints today.        Past Medical History:  Diagnosis Date  . Depression     There are no problems to display for this patient.   Past Surgical History:  Procedure Laterality Date  . NO PAST SURGERIES    . SALPINGECTOMY       OB History    Gravida  5   Para  2   Term  2   Preterm  0   AB  2   Living  2     SAB  1   TAB      Ectopic  1   Multiple  0   Live Births              No family history on file.  Social History   Tobacco Use  . Smoking status: Current Every Day Smoker  Substance Use Topics  . Alcohol use: Yes  . Drug use: Yes    Types: Cocaine, Marijuana    Home Medications Prior to Admission medications   Medication Sig Start Date End Date Taking? Authorizing Provider  benztropine (COGENTIN) 1 MG tablet Take 1 mg by mouth 2 (two) times daily. 08/19/19  Yes [provider]  FLUoxetine (PROZAC) 40 MG capsule Take 40 mg by mouth daily.   Yes [provider]  gabapentin (NEURONTIN) 300 MG capsule Take 300 mg by mouth 3 (three) times daily.   Yes [provider]  REXULTI 1 MG TABS tablet Take 1 mg by mouth 2 (two) times daily. 07/20/19  Yes [provider]    Allergies    Patient  has no known allergies.  Review of Systems   Review of Systems  Unable to perform ROS: Psychiatric disorder  Skin: Positive for wound.  Psychiatric/Behavioral: Positive for suicidal ideas.    Physical Exam Updated Vital Signs BP 118/77   Pulse (!) 105   Temp (!) 97.4 F (36.3 C) (Oral)   Resp 18   SpO2 97%   Physical Exam Vitals and nursing note reviewed.  Constitutional:      General: She is not in acute distress.    Appearance: She is well-developed. She is not diaphoretic.  HENT:     Head: Normocephalic and atraumatic.  Cardiovascular:     Rate and Rhythm: Normal rate and regular rhythm.     Pulses: Normal pulses.     Heart sounds: Normal heart sounds.  Pulmonary:     Effort: Pulmonary effort is normal.     Breath sounds: Normal breath sounds.  Abdominal:     Tenderness: There is no abdominal tenderness.  Skin:    General: Skin is warm and dry.     Findings: No erythema or rash.  Comments: Deep wound to left wrist, no evidence of infection. Superficial wound to right wrist.   Neurological:     Mental Status: She is alert.  Psychiatric:        Mood and Affect: Affect is inappropriate.        Speech: Speech is tangential.        Thought Content: Thought content includes suicidal ideation.     ED Results / Procedures / Treatments   Labs (all labs ordered are listed, but only abnormal results are displayed) Labs Reviewed  COMPREHENSIVE METABOLIC PANEL - Abnormal; Notable for the following components:      Result Value   Calcium 8.4 (*)    All other components within normal limits  CBC WITH DIFFERENTIAL/PLATELET - Abnormal; Notable for the following components:   Hemoglobin 11.6 (*)    All other components within normal limits  SALICYLATE LEVEL - Abnormal; Notable for the following components:   Salicylate Lvl <7.0 (*)    All other components within normal limits  ACETAMINOPHEN LEVEL - Abnormal; Notable for the following components:   Acetaminophen  (Tylenol), Serum <10 (*)    All other components within normal limits  SARS CORONAVIRUS 2 BY RT PCR (HOSPITAL ORDER, PERFORMED IN [facility])  ETHANOL  RAPID URINE DRUG SCREEN, HOSP PERFORMED  PREGNANCY, URINE    EKG EKG Interpretation  Date/Time:  Saturday Nov 21 2019 17:47:18 EDT Ventricular Rate:  85 PR Interval:    QRS Duration: 88 QT Interval:  371 QTC Calculation: 442 R Axis:   53 Text Interpretation: Sinus rhythm Borderline short PR interval Low voltage, precordial leads Nonspecific T abnormalities, anterior leads Confirmed by Kristine Royal [phone]) on 11/21/2019 5:49:31 PM   Radiology No results found.  Procedures Procedures (including critical care time)  Medications Ordered in ED Medications  benztropine (COGENTIN) tablet 1 mg (has no administration in time range)  FLUoxetine (PROZAC) capsule 40 mg (has no administration in time range)  gabapentin (NEURONTIN) capsule 300 mg (has no administration in time range)  brexpiprazole (REXULTI) tablet 1 mg (has no administration in time range)    ED Course  I have reviewed the triage vital signs and the nursing notes.  Pertinent labs & imaging results that were available during my care of the patient were reviewed by me and considered in my medical decision making (see chart for details).  Clinical Course as of Nov 21 2238  Sat Nov 21, 2019  [id number] 44 year old female presents with suicidal ideation with small wound to her right wrist which is new, chronic and healing wound to the left wrist.  Patient is medically cleared for behavioral health evaluation, labs as documented without significant findings. Patient has been seen by behavioral health who recommends inpatient treatment.   [LM]    Clinical Course User Index [LM] Alden Hipp   MDM Rules/Calculators/A&P                      Final Clinical Impression(s) / ED Diagnoses Final diagnoses:  Psychosis, unspecified psychosis type [facility])     Rx / DC Orders ED Discharge Orders    None       Alden Hipp 11/21/19 2241    Wynetta Fines, MD 11/21/19 [phone]

## 2019-11-21 NOTE — ED Notes (Signed)
Patient changed into paper scrubs. Wanded by security.

## 2019-11-21 NOTE — ED Notes (Signed)
Delay in triage as patient is using the restroom.

## 2019-11-21 NOTE — BH Assessment (Signed)
Tele Assessment Note   Patient Name: Yolanda [name] MRN: [id number] Referring Physician: Suella Broad, PA Location of Patient: [facility] Location of Provider: [place] is an 44 y.o. female presenting with SI with no plan and command auditory hallucinations to kill herself. Patient reported onset of SI was 2-3 months ago with no stressors or triggers in life. Patient reported hearing command voices has worsened. Patient reported voices are saying, "kill yourself so Jesus can heal you and you will be normal again". Patient reported voices continually tell her to "give up". During assessment patient stated, "I am different than everybody else, I am a burden to my family, I just want to be happy like I used to be". Patient admitted to inpatient psych treatment 11/13/19 at [facility] due to suicide attempt of cutting self on arm with box cutter. Patient reported worsening depressive symptoms, isolation, crying spells, feelings of guilt and worthlessness, anger/irritability and lack of interest. Patient denied HI and drug/alcohol usage. During assessment patient would exhibited crying spells and was cooperative.   Patient is currently receiving medication management from Dr. Jake Samples. Patient reported taking medicine as prescribed and that the medication is not working. Patient reported not having a therapist. Patient inpatient psych treatment includes, 11/13/19, 07/19/19, 05/26/19, 03/19/19 at [facility].   Patient currently resides with her mother, daughter and nephew. Patient receives disability. Patient denied access to guns.    Diagnosis: Bipolar and Schizophrenia  Past Medical History:  Past Medical History:  Diagnosis Date  . Depression     Past Surgical History:  Procedure Laterality Date  . NO PAST SURGERIES    . SALPINGECTOMY      Family History: No family history on file.  Social History:  reports that she has been smoking. She does  not have any smokeless tobacco history on file. She reports current alcohol use. She reports current drug use. Drugs: Cocaine and Marijuana.  Additional Social History:  Alcohol / Drug Use Pain Medications: see MAR Prescriptions: see MAR Over the Counter: see MAR  CIWA: CIWA-Ar BP: 118/77 Pulse Rate: (!) 105 COWS:    Allergies: No Known Allergies  Home Medications: (Not in a hospital admission)   OB/GYN Status:  No LMP recorded.  General Assessment Data Location of Assessment: [facility] TTS Assessment: In system Is this a Tele or Face-to-Face Assessment?: Tele Assessment Is this an Initial Assessment or a Re-assessment for this encounter?: Initial Assessment Patient Accompanied by:: N/A Language Other than English: No Living Arrangements: Other (Comment)(family home) What gender do you identify as?: Female Marital status: Single Pregnancy Status: Unknown Living Arrangements: Parent, Children, Other relatives Can pt return to current living arrangement?: Yes Admission Status: Voluntary Is patient capable of signing voluntary admission?: Yes Referral Source: Self/Family/Friend  Crisis Care Plan Living Arrangements: Parent, Children, Other relatives Legal Guardian: (self) Name of Psychiatrist: (Dr. Jake Samples) Name of Therapist: (none)  Education Status Is patient currently in school?: No Is the patient employed, unemployed or receiving disability?: Receiving disability income  Risk to self with the past 6 months Suicidal Ideation: Yes-Currently Present Has patient been a risk to self within the past 6 months prior to admission? : Yes Suicidal Intent: Yes-Currently Present Has patient had any suicidal intent within the past 6 months prior to admission? : Yes Is patient at risk for suicide?: Yes Suicidal Plan?: No-Not Currently/Within Last 6 Months Specify Current Suicidal Plan: (patient will not disclose) Access to Means: (unknown) What has  been your use of drugs/alcohol  within the last 12 months?: (none) Previous Attempts/Gestures: Yes How many times?: (1) Other Self Harm Risks: (cutting wrist) Triggers for Past Attempts: Hallucinations Intentional Self Injurious Behavior: Cutting Comment - Self Injurious Behavior: (cutting arms) Family Suicide History: No Recent stressful life event(s): (hallucinations) Persecutory voices/beliefs?: No Depression: Yes Depression Symptoms: Insomnia, Tearfulness, Isolating, Fatigue, Guilt, Loss of interest in usual pleasures, Feeling worthless/self pity, Feeling angry/irritable Substance abuse history and/or treatment for substance abuse?: No Suicide prevention information given to non-admitted patients: Not applicable  Risk to Others within the past 6 months Homicidal Ideation: No Does patient have any lifetime risk of violence toward others beyond the six months prior to admission? : No Thoughts of Harm to Others: No Current Homicidal Intent: No Current Homicidal Plan: No Access to Homicidal Means: No Identified Victim: (n/a) History of harm to others?: No Assessment of Violence: None Noted Violent Behavior Description: (none reported) Does patient have access to weapons?: No Criminal Charges Pending?: No Does patient have a court date: No Is patient on probation?: No  Psychosis Hallucinations: Auditory Delusions: Unspecified  Mental Status Report Appearance/Hygiene: Unremarkable Eye Contact: Good Motor Activity: Freedom of movement Speech: Soft, Slow Level of Consciousness: Alert Mood: Anxious, Depressed, Preoccupied Affect: Depressed, Anxious, Preoccupied Anxiety Level: Moderate Thought Processes: Relevant Judgement: Impaired Orientation: Person, Place, Time, Situation Obsessive Compulsive Thoughts/Behaviors: None  Cognitive Functioning Concentration: Fair Memory: Recent Intact Is patient IDD: No Insight: Poor Impulse Control: Poor Appetite: Fair Have you had any weight changes? : No  Change Sleep: Decreased Total Hours of Sleep: (unable to assess) Vegetative Symptoms: Staying in bed, Decreased grooming  ADLScreening [facility] Assessment Services) Patient's cognitive ability adequate to safely complete daily activities?: Yes Patient able to express need for assistance with ADLs?: Yes Independently performs ADLs?: Yes (appropriate for developmental age)  Prior Inpatient Therapy Prior Inpatient Therapy: Yes Prior Therapy Dates: (5/21, 1/21, 11/20, 9/20) Prior Therapy Facilty/Provider(s): [facility]) Reason for Treatment: (mental illness)  Prior Outpatient Therapy Prior Outpatient Therapy: Yes Prior Therapy Dates: (present) Prior Therapy Facilty/Provider(s): (Dr. Jeannine Kitten) Reason for Treatment: (bipolar and schizophrenia) Does patient have an ACCT team?: No Does patient have Intensive In-House Services?  : No Does patient have [facility] services? : No Does patient have P4CC services?: No  ADL Screening (condition at time of admission) Patient's cognitive ability adequate to safely complete daily activities?: Yes Patient able to express need for assistance with ADLs?: Yes Independently performs ADLs?: Yes (appropriate for developmental age)  Advance Directives (For Healthcare) Does Patient Have a Medical Advance Directive?: No   Disposition:  Disposition Initial Assessment Completed for this Encounter: Yes  Nira Conn, NP, patient meets inpatient criteria. TTS to secure placement.   This service was provided via telemedicine using a 2-way, interactive audio and video technology.  Names of all persons participating in this telemedicine service and their role in this encounter. Name: Dilpreet Cameron Role: Patient  Name: Al Corpus Role: TTS Clinician  Name:  Role:   Name:  Role:     Burnetta Sabin 11/21/2019 8:40 PM

## 2019-11-21 NOTE — ED Notes (Signed)
One labeled patient belongings bag placed at triage nurse's station.

## 2019-11-21 NOTE — ED Triage Notes (Signed)
Patient c/o SI without plan and depression. Recently admitted after SI attempt. Bandage to left wrist where patient cut wrist weeks ago. Reports taking psych meds as prescribed.

## 2019-11-21 NOTE — ED Notes (Signed)
TTS assessment complete. °Jason Berry, NP, patient meets inpatient criteria. TTS to secure placement °

## 2019-11-22 DIAGNOSIS — F4325 Adjustment disorder with mixed disturbance of emotions and conduct: Secondary | ICD-10-CM

## 2019-11-22 LAB — PREGNANCY, URINE: Preg Test, Ur: NEGATIVE

## 2019-11-22 NOTE — Discharge Instructions (Signed)
Adjustment Disorder, Adult Adjustment disorder is a group of symptoms that can develop after a stressful life event, such as the loss of a job or serious physical illness. The symptoms can affect how you feel, think, and act. They may interfere with your relationships. Adjustment disorder increases your risk of suicide and substance abuse. If this disorder is not managed early, it can develop into a more serious condition, such as major depressive disorder or post-traumatic stress disorder. What are the causes? This condition happens when you have trouble recovering from or coping with a stressful life event. What increases the risk? You are more likely to develop this condition if:  You have had depression or anxiety.  You are being treated for a long-term (chronic) illness.  You are being treated for an illness that cannot be cured (terminal illness).  You have a family history of mental illness. What are the signs or symptoms? Symptoms of this condition include:  Extreme trouble doing daily tasks, such as going to work.  Sadness, depression, or crying spells.  Worrying a lot.  Loss of enjoyment.  Change in appetite or weight.  Feelings of loss or hopelessness.  Thoughts of suicide.  Anxiety, worry, or nervousness.  Trouble sleeping.  Avoiding family and friends.  Fighting or vandalism.  Complaining of feeling sick without being ill.  Feeling dazed or disconnected.  Nightmares.  Trouble sleeping.  Irritability.  Reckless driving.  Poor work performance.  Ignoring bills. Symptoms of this condition start within three months of the stressful event. They do not last more than six months, unless the stressful circumstances last longer. Normal grieving after the death of a loved one is not a symptom of this condition. How is this diagnosed? To diagnose this condition, your health care provider will ask about what has happened in your life and how it has affected  you. He or she may also ask about your medical history and your use of medicines, alcohol, and other substances. Your health care provider may do a physical exam and order lab tests or other studies. You may be referred to a mental health specialist. How is this treated? Treatment options for this condition include:  Counseling or talk therapy. Talk therapy is usually provided by mental health specialists.  Medicines. Certain medicines may help with depression, anxiety, and sleep.  Support groups. These offer emotional support, advice, and guidance. They are made up of people who have had similar experiences.  Observation and time. This is sometimes called "watchful waiting." In this treatment, health care providers monitor your health and behavior without other treatment. Adjustment disorder sometimes gets better on its own with time. Follow these instructions at home:  Take over-the-counter and prescription medicines only as told by your health care provider.  Keep all follow-up visits as told by your health care provider. This is important. Contact a health care provider if:  Your symptoms do not improve in six months.  Your symptoms get worse. Get help right away if:  You have serious thoughts about hurting yourself or someone else. If you ever feel like you may hurt yourself or others, or have thoughts about taking your own life, get help right away. You can go to your nearest emergency department or call:  Your local emergency services (911 in the [place]  A suicide crisis helpline, such as the National Suicide Prevention Lifeline at 1-800-273-8255. This is open 24 hours a day. Summary  Adjustment disorder is a group of symptoms that can develop   after a stressful life event, such as the loss of a job or serious physical illness. The symptoms can affect how you feel, think, and act. They may interfere with your relationships.  Symptoms of this condition start within three months  of the stressful event. They do not last more than six months, unless the stressful circumstances last longer.  Treatment may include talk therapy, medicines, participation in a support group, or observation to see if symptoms improve.  Contact your health care provider if your symptoms get worse or do not improve in six months.  If you ever feel like you may hurt yourself or others, or have thoughts about taking your own life, get help right away. This information is not intended to replace advice given to you by your health care provider. Make sure you discuss any questions you have with your health care provider. Document Revised: 06/07/2017 Document Reviewed: 08/24/2016 Elsevier Patient Education  2020 [employer]  

## 2019-11-22 NOTE — BH Assessment (Signed)
Received a call back from [facility] staff Tasia Catchings) and informed him that patient was being discharged.

## 2019-11-22 NOTE — ED Notes (Signed)
Pt to room. Pt calm and cooperative, no s/s of distress. Pt oriented to unit.

## 2019-11-22 NOTE — ED Notes (Signed)
No sitter at this time. 

## 2019-11-22 NOTE — ED Notes (Signed)
Pt belongings upon start of day shift were in triage holding, labeled.

## 2019-11-22 NOTE — BH Assessment (Addendum)
Per Nanine Means, NP, patient to be discharged home. Patient recommended to follow up with current provider-[facility] (ACTT team).   Contacted provider's crises line @ [phone] to make staff aware that patient would be discharged home today with mother... and request that they follow up with patient. Left a message with the answering service to call this clinician back.   No one has returned call. Will re-attempt to call ACTT provider.

## 2019-11-22 NOTE — BH Assessment (Signed)
Called patient's ACTT provider-[facility] to make them aware of patient's discharge. Left another message with the crises answering services at [phone] to inform providers of patient's discharge.

## 2019-11-22 NOTE — ED Notes (Signed)
Pt belongings transported with staff to TCU, pt was wanded by security prior to transfer, pt belongings placed in locker #27 and locked.

## 2019-11-22 NOTE — Consult Note (Addendum)
[facility] Psych ED Discharge  11/22/2019 1:56 PM Yolanda [name]  MRN:  [id number] Principal Problem: Adjustment disorder with mixed disturbance of emotions and conduct Discharge Diagnoses: Principal Problem:   Adjustment disorder with mixed disturbance of emotions and conduct  Subjective: "I want to go home."  Patient seen and evaluated in person by this provider.  She reports been admitted to [facility] a couple weeks ago after cutting her wrist in a suicide attempt.  She is followed by [facility] and request her psychiatrist, Dr. Jake Samples.  When told that he no longer works here, she wanted to return home where she lives with her mother.  Client was assessed for safety and she denies suicidal/homicidal ideations, hallucinations, paranoia, and other concerning factors.  She states that she will let her mother know if she is having suicidal thoughts or she will return to the emergency department or call her [facility].  Dr. Darleene Cleaver reviewed this client and concurs with the plan.  Total Time spent with patient: 1 hour  Past Psychiatric History: depression  Past Medical History:  Past Medical History:  Diagnosis Date  . Depression     Past Surgical History:  Procedure Laterality Date  . NO PAST SURGERIES    . SALPINGECTOMY     Family History: No family history on file. Family Psychiatric  History: none Social History:  Social History   Substance and Sexual Activity  Alcohol Use Yes     Social History   Substance and Sexual Activity  Drug Use Yes  . Types: Cocaine, Marijuana    Social History   Socioeconomic History  . Marital status: Single    Spouse name: Not on file  . Number of children: Not on file  . Years of education: Not on file  . Highest education level: Not on file  Occupational History  . Not on file  Tobacco Use  . Smoking status: Current Every Day Smoker  Substance and Sexual Activity  . Alcohol use: Yes  . Drug use: Yes    Types: Cocaine,  Marijuana  . Sexual activity: Not on file  Other Topics Concern  . Not on file  Social History Narrative  . Not on file   Social Determinants of Health   Financial Resource Strain:   . Difficulty of Paying Living Expenses:   Food Insecurity:   . Worried About Charity fundraiser in the Last Year:   . Arboriculturist in the Last Year:   Transportation Needs:   . Film/video editor (Medical):   Marland Kitchen Lack of Transportation (Non-Medical):   Physical Activity:   . Days of Exercise per Week:   . Minutes of Exercise per Session:   Stress:   . Feeling of Stress :   Social Connections:   . Frequency of Communication with Friends and Family:   . Frequency of Social Gatherings with Friends and Family:   . Attends Religious Services:   . Active Member of Clubs or Organizations:   . Attends Archivist Meetings:   Marland Kitchen Marital Status:     Has this patient used any form of tobacco in the last 30 days? (Cigarettes, Smokeless Tobacco, Cigars, and/or Pipes) NA  Current Medications: Current Facility-Administered Medications  Medication Dose Route Frequency Provider Last Rate Last Admin  . benztropine (COGENTIN) tablet 1 mg  1 mg Oral BID Suella Broad A, PA-C   1 mg at 11/22/19 1046  . brexpiprazole (REXULTI) tablet 1 mg  1 mg Oral BID Army Melia A, PA-C   1 mg at 11/22/19 1045  . FLUoxetine (PROZAC) capsule 40 mg  40 mg Oral Daily Wynetta Fines, MD   40 mg at 11/22/19 1045  . gabapentin (NEURONTIN) capsule 300 mg  300 mg Oral TID Army Melia A, PA-C   300 mg at 11/22/19 1046   Current Outpatient Medications  Medication Sig Dispense Refill  . benztropine (COGENTIN) 1 MG tablet Take 1 mg by mouth 2 (two) times daily.    Marland Kitchen FLUoxetine (PROZAC) 40 MG capsule Take 40 mg by mouth daily.    Marland Kitchen gabapentin (NEURONTIN) 300 MG capsule Take 300 mg by mouth 3 (three) times daily.    Marland Kitchen REXULTI 1 MG TABS tablet Take 1 mg by mouth 2 (two) times daily.     PTA Medications: (Not in a  hospital admission)   Musculoskeletal: Strength & Muscle Tone: within normal limits Gait & Station: normal Patient leans: N/A  Psychiatric Specialty Exam: Physical Exam Vitals and nursing note reviewed.  Constitutional:      Appearance: Normal appearance.  HENT:     Head: Normocephalic.  Musculoskeletal:        General: Normal range of motion.     Cervical back: Normal range of motion.  Neurological:     General: No focal deficit present.     Mental Status: She is alert and oriented to person, place, and time.  Psychiatric:        Attention and Perception: Attention and perception normal.        Mood and Affect: Mood and affect normal.        Speech: Speech normal.        Behavior: Behavior normal. Behavior is cooperative.        Thought Content: Thought content normal.        Cognition and Memory: Cognition and memory normal.        Judgment: Judgment normal.     Review of Systems  Psychiatric/Behavioral: The patient is nervous/anxious.   All other systems reviewed and are negative.   Blood pressure 118/77, pulse (!) 105, temperature (!) 97.4 F (36.3 C), temperature source Oral, resp. rate 18, SpO2 97 %.There is no height or weight on file to calculate BMI.  General Appearance: Casual  Eye Contact:  Good  Speech:  Normal Rate  Volume:  Normal  Mood:  Anxious, mild  Affect:  Congruent  Thought Process:  Coherent and Descriptions of Associations: Intact  Orientation:  Full (Time, Place, and Person)  Thought Content:  WDL and Logical  Suicidal Thoughts:  No  Homicidal Thoughts:  No  Memory:  Immediate;   Good Recent;   Good Remote;   Good  Judgement:  Fair  Insight:  Fair  Psychomotor Activity:  Normal  Concentration:  Concentration: Good and Attention Span: Good  Recall:  Good  Fund of Knowledge:  Fair  Language:  Good  Akathisia:  No  Handed:  Right  AIMS (if indicated):     Assets:  Housing Leisure Time Physical Health Resilience Social Support   ADL's:  Intact  Cognition:  WNL  Sleep:        Demographic Factors:  NA  Loss Factors: NA  Historical Factors: NA  Risk Reduction Factors:   Sense of responsibility to family, Living with another person, especially a relative, Positive social support and Positive therapeutic relationship  Continued Clinical Symptoms:  Anxiety, mild  Cognitive Features That Contribute To Risk:  None    Suicide Risk:  Minimal: No identifiable suicidal ideation.  Patients presenting with no risk factors but with morbid ruminations; may be classified as minimal risk based on the severity of the depressive symptoms   Plan Of Care/Follow-up recommendations:  Major depressive disorder, recurrent, mild: -Follow up with [facility] -Continue Rexulti 1 mg BID -Continue Prozac 40 mg daily  EPS: -Continue Cogentin 1 mg BID Activity:  as tolerated Diet:  heart healthy diet  Disposition: discharge home Nanine Means, NP 11/22/2019, 1:56 PM  Patient seen face-to-face for psychiatric evaluation, chart reviewed and case discussed with the physician extender and developed treatment plan. Reviewed the information documented and agree with the treatment plan. Thedore Mins, MD

## 2019-11-22 NOTE — ED Notes (Signed)
Pt declined to have COVID testing done at this time.

## 2019-11-22 NOTE — ED Notes (Signed)
Pt DC off unit to home per provider. Pt alert, cooperative, no s/s of distress. DC information given to and reviewed with pt. Belongings given to pt. Pt ambulatory off unit, escorted by NT. Pt transported by family.

## 2020-07-23 DIAGNOSIS — F79 Unspecified intellectual disabilities: Secondary | ICD-10-CM

## 2020-07-24 DIAGNOSIS — S61519A Laceration without foreign body of unspecified wrist, initial encounter: Secondary | ICD-10-CM | POA: Insufficient documentation

## 2021-07-31 DIAGNOSIS — K8689 Other specified diseases of pancreas: Secondary | ICD-10-CM | POA: Insufficient documentation

## 2021-08-18 DIAGNOSIS — D136 Benign neoplasm of pancreas: Secondary | ICD-10-CM | POA: Insufficient documentation

## 2022-01-17 DIAGNOSIS — S42401B Unspecified fracture of lower end of right humerus, initial encounter for open fracture: Secondary | ICD-10-CM

## 2022-01-17 DIAGNOSIS — S3282XA Multiple fractures of pelvis without disruption of pelvic ring, initial encounter for closed fracture: Secondary | ICD-10-CM | POA: Insufficient documentation

## 2022-01-17 DIAGNOSIS — S52502A Unspecified fracture of the lower end of left radius, initial encounter for closed fracture: Secondary | ICD-10-CM | POA: Insufficient documentation

## 2022-01-17 DIAGNOSIS — T1490XA Injury, unspecified, initial encounter: Secondary | ICD-10-CM | POA: Insufficient documentation

## 2022-01-17 DIAGNOSIS — S72001A Fracture of unspecified part of neck of right femur, initial encounter for closed fracture: Secondary | ICD-10-CM

## 2022-01-17 HISTORY — DX: Unspecified fracture of lower end of right humerus, initial encounter for open fracture: S42.401B

## 2022-01-17 HISTORY — DX: Fracture of unspecified part of neck of right femur, initial encounter for closed fracture: S72.001A

## 2022-07-18 DIAGNOSIS — F259 Schizoaffective disorder, unspecified: Secondary | ICD-10-CM | POA: Insufficient documentation

## 2022-07-21 DIAGNOSIS — E559 Vitamin D deficiency, unspecified: Secondary | ICD-10-CM | POA: Insufficient documentation

## 2022-07-21 DIAGNOSIS — R296 Repeated falls: Secondary | ICD-10-CM

## 2022-07-21 DIAGNOSIS — W19XXXA Unspecified fall, initial encounter: Secondary | ICD-10-CM

## 2022-07-21 DIAGNOSIS — J0110 Acute frontal sinusitis, unspecified: Secondary | ICD-10-CM | POA: Insufficient documentation

## 2022-07-21 HISTORY — DX: Repeated falls: R29.6

## 2022-08-17 DIAGNOSIS — K5900 Constipation, unspecified: Secondary | ICD-10-CM

## 2022-08-17 HISTORY — DX: Constipation, unspecified: K59.00

## 2022-11-07 ENCOUNTER — Encounter (HOSPITAL_COMMUNITY): Payer: Self-pay | Admitting: Emergency Medicine

## 2022-11-07 ENCOUNTER — Other Ambulatory Visit: Payer: Self-pay

## 2022-11-07 ENCOUNTER — Emergency Department (HOSPITAL_COMMUNITY)
Admission: EM | Admit: 2022-11-07 | Discharge: 2022-11-07 | Disposition: A | Discharge status: Home / Self Care | Payer: Medicaid Other | Attending: Emergency Medicine | Admitting: Emergency Medicine

## 2022-11-07 DIAGNOSIS — R2243 Localized swelling, mass and lump, lower limb, bilateral: Secondary | ICD-10-CM | POA: Diagnosis present

## 2022-11-07 DIAGNOSIS — R600 Localized edema: Secondary | ICD-10-CM | POA: Insufficient documentation

## 2022-11-07 NOTE — ED Triage Notes (Signed)
Patient BIB [facility] w/ complaints of bilateral foot swelling and pain ongoing for a week now. Patient denies chest pain, shortness of breath,abdominal pain, decreased urinary output, or any precipitating events to cause this swelling. Aox4. VSS.

## 2022-11-07 NOTE — ED Provider Notes (Signed)
[place] EMERGENCY DEPARTMENT AT [facility] Provider Note   CSN: [id number] Arrival date & time: 11/07/22  1614     History  Chief Complaint  Patient presents with   Foot Swelling    Caridad D Abdelrahman is a 47 y.o. female.  49 yo F with a chief complaint of bilateral leg swelling.  Going on for a few days now.  When asked her if anything is changed recently she told me that she has been living in a shelter and has been sleeping sitting up.  She denies chest pain denies difficulty breathing denies orthopnea or PND.        Home Medications Prior to Admission medications   Medication Sig Start Date End Date Taking? Authorizing Provider  benztropine (COGENTIN) 1 MG tablet Take 1 mg by mouth 2 (two) times daily. 08/19/19   [provider]  FLUoxetine (PROZAC) 40 MG capsule Take 40 mg by mouth daily.    [provider]  gabapentin (NEURONTIN) 300 MG capsule Take 300 mg by mouth 3 (three) times daily.    [provider]  REXULTI 1 MG TABS tablet Take 1 mg by mouth 2 (two) times daily. 07/20/19   [provider]      Allergies    Patient has no known allergies.    Review of Systems   Review of Systems  Physical Exam Updated Vital Signs BP 119/74 (BP Location: Left Arm)   Pulse 85   Temp 98.7 F (37.1 C) (Oral)   Resp 18   Ht 4\' 11"  (1.499 m)   Wt 63.5 kg   SpO2 99%   BMI 28.28 kg/m  Physical Exam Vitals and nursing note reviewed.  Constitutional:      General: She is not in acute distress.    Appearance: She is well-developed. She is not diaphoretic.  HENT:     Head: Normocephalic and atraumatic.  Eyes:     Pupils: Pupils are equal, round, and reactive to light.  Cardiovascular:     Rate and Rhythm: Normal rate and regular rhythm.     Heart sounds: No murmur heard.    No friction rub. No gallop.  Pulmonary:     Effort: Pulmonary effort is normal.     Breath sounds: No wheezing or rales.  Abdominal:     General:  There is no distension.     Palpations: Abdomen is soft.     Tenderness: There is no abdominal tenderness.  Musculoskeletal:        General: No tenderness.     Cervical back: Normal range of motion and neck supple.     Right lower leg: Edema present.     Left lower leg: Edema present.     Comments: 2+ edema to bilateral lower extremities up to the shins.  Pulse motor and sensation intact.  Skin:    General: Skin is warm and dry.  Neurological:     Mental Status: She is alert and oriented to person, place, and time.  Psychiatric:        Behavior: Behavior normal.     ED Results / Procedures / Treatments   Labs (all labs ordered are listed, but only abnormal results are displayed) Labs Reviewed - No data to display  EKG None  Radiology No results found.  Procedures Procedures    Medications Ordered in ED Medications - No data to display  ED Course/ Medical Decision Making/ A&P  Medical Decision Making  47 yo F with a chief complaint of bilateral leg swelling.  Going on for the past few days.  Coincides and when she started sleeping sitting up in a chair.  I suspect she has dependent edema.  Encouraged her to elevate her extremities at home.  Encouraged PCP follow-up.  4:54 PM:  I have discussed the diagnosis/risks/treatment options with the patient.  Evaluation and diagnostic testing in the emergency department does not suggest an emergent condition requiring admission or immediate intervention beyond what has been performed at this time.  They will follow up with PCP. We also discussed returning to the ED immediately if new or worsening sx occur. We discussed the sx which are most concerning (e.g., sudden worsening pain, fever, inability to tolerate by mouth) that necessitate immediate return. Medications administered to the patient during their visit and any new prescriptions provided to the patient are listed below.  Medications given during  this visit Medications - No data to display   The patient appears reasonably screen and/or stabilized for discharge and I doubt any other medical condition or other [facility] requiring further screening, evaluation, or treatment in the ED at this time prior to discharge.          Final Clinical Impression(s) / ED Diagnoses Final diagnoses:  Peripheral edema    Rx / DC Orders ED Discharge Orders     None         Melene Plan, DO 11/07/22 1654

## 2022-11-07 NOTE — ED Notes (Signed)
Dc instructions reviewed with pt. PT verbalized understanding. Pt DC.  °

## 2022-11-07 NOTE — Discharge Instructions (Signed)
Try to elevate your legs as best you can.  Also getting up and moving around tends to help with this.  If you can find a better place to sleep we can elevate your legs that will certainly help.  Please follow-up with your family doctor in the office.

## 2022-11-10 ENCOUNTER — Emergency Department (HOSPITAL_COMMUNITY): Payer: Medicaid Other

## 2022-11-10 ENCOUNTER — Other Ambulatory Visit: Payer: Self-pay

## 2022-11-10 ENCOUNTER — Encounter (HOSPITAL_COMMUNITY): Payer: Self-pay

## 2022-11-10 ENCOUNTER — Ambulatory Visit (HOSPITAL_COMMUNITY)
Admission: EM | Admit: 2022-11-10 | Discharge: 2022-11-10 | Disposition: A | Discharge status: Home / Self Care | Payer: Medicaid Other

## 2022-11-10 ENCOUNTER — Emergency Department (HOSPITAL_COMMUNITY)
Admission: EM | Admit: 2022-11-10 | Discharge: 2022-11-10 | Disposition: A | Discharge status: Home / Self Care | Payer: Medicaid Other | Attending: Emergency Medicine | Admitting: Emergency Medicine

## 2022-11-10 DIAGNOSIS — M7989 Other specified soft tissue disorders: Secondary | ICD-10-CM | POA: Diagnosis not present

## 2022-11-10 DIAGNOSIS — M79672 Pain in left foot: Secondary | ICD-10-CM | POA: Diagnosis present

## 2022-11-10 DIAGNOSIS — I959 Hypotension, unspecified: Secondary | ICD-10-CM | POA: Diagnosis not present

## 2022-11-10 DIAGNOSIS — M79671 Pain in right foot: Secondary | ICD-10-CM | POA: Insufficient documentation

## 2022-11-10 HISTORY — DX: Bipolar disorder, current episode depressed, mild or moderate severity, unspecified: F31.30

## 2022-11-10 LAB — CBC WITH DIFFERENTIAL/PLATELET
Abs Immature Granulocytes: 0.03 10*3/uL (ref 0.00–0.07)
Basophils Absolute: 0.1 10*3/uL (ref 0.0–0.1)
Basophils Relative: 1 %
Eosinophils Absolute: 0.4 10*3/uL (ref 0.0–0.5)
Eosinophils Relative: 5 %
HCT: 38.1 % (ref 36.0–46.0)
Hemoglobin: 12 g/dL (ref 12.0–15.0)
Immature Granulocytes: 0 %
Lymphocytes Relative: 39 %
Lymphs Abs: 3.1 10*3/uL (ref 0.7–4.0)
MCH: 28.8 pg (ref 26.0–34.0)
MCHC: 31.5 g/dL (ref 30.0–36.0)
MCV: 91.4 fL (ref 80.0–100.0)
Monocytes Absolute: 0.6 10*3/uL (ref 0.1–1.0)
Monocytes Relative: 7 %
Neutro Abs: 3.9 10*3/uL (ref 1.7–7.7)
Neutrophils Relative %: 48 %
Platelets: 356 10*3/uL (ref 150–400)
RBC: 4.17 MIL/uL (ref 3.87–5.11)
RDW: 15.1 % (ref 11.5–15.5)
WBC: 8.1 10*3/uL (ref 4.0–10.5)
nRBC: 0 % (ref 0.0–0.2)

## 2022-11-10 LAB — BASIC METABOLIC PANEL
Anion gap: 8 (ref 5–15)
BUN: <5 mg/dL — ABNORMAL LOW (ref 6–20)
CO2: 27 mmol/L (ref 22–32)
Calcium: 9.2 mg/dL (ref 8.9–10.3)
Chloride: 105 mmol/L (ref 98–111)
Creatinine, Ser: 0.69 mg/dL (ref 0.44–1.00)
GFR, Estimated: >60 mL/min (ref >60)
Glucose, Bld: 80 mg/dL (ref 70–99)
Potassium: 3.9 mmol/L (ref 3.5–5.1)
Sodium: 140 mmol/L (ref 135–145)

## 2022-11-10 MED ORDER — ACETAMINOPHEN 325 MG PO TABS
650.0000 mg | ORAL_TABLET | Freq: Once | ORAL | Status: AC
  Administered 2022-11-10: 650 mg via ORAL
  Filled 2022-11-10: qty 2

## 2022-11-10 MED ORDER — SODIUM CHLORIDE 0.9 % IV BOLUS: 500.0000 mL | Freq: Once | INTRAVENOUS | Status: DC

## 2022-11-10 MED ORDER — SODIUM CHLORIDE 0.9 % IV BOLUS
500.0000 mL | Freq: Once | INTRAVENOUS | Status: AC
  Administered 2022-11-10: 500 mL via INTRAVENOUS

## 2022-11-10 NOTE — ED Notes (Signed)
[facility] has called saying they are coming instead of EMS

## 2022-11-10 NOTE — ED Triage Notes (Signed)
[facility] reports pt coming UC for bilateral foot pain and hypotension.

## 2022-11-10 NOTE — ED Triage Notes (Signed)
Patient is being discharged from the Urgent Care and sent to the Emergency Department via EMS . Per Dorann Ou, PA, patient is in need of higher level of care due to hypotension. Patient is aware and verbalizes understanding of plan of care.  Vitals:   11/10/22 1635 11/10/22 1730  BP: (!) 90/58 (!) 87/61  Pulse: (!) 108 100  Resp: 20 18  Temp: 97.9 F (36.6 C)   SpO2: 95%

## 2022-11-10 NOTE — ED Notes (Signed)
Dee, CMA has notified [facility] ( [facility] not available )

## 2022-11-10 NOTE — ED Provider Notes (Signed)
[place] [facility] AT [facility] Provider Note   CSN: [id number] Arrival date & time: 11/10/22  1810     History  Chief Complaint  Patient presents with   Foot Pain    Yolanda [name] is a 47 y.o. female history of adjustment disorder presented with bilateral feet swelling/pain, low blood pressures with high heart rate.  Patient's had bilateral feet swelling for the past few weeks and has been evaluated in the ED before diagnosed with foot swelling.  Patient states the pain is exacerbated when she walks and that is a sharp pain that stays in her calf muscles.  Patient states she does have a high salt diet but that she also does walk a lot.  Patient went to the urgent care today to be evaluated for her chronic bilateral feet pain however upon arrival had low blood pressure and high heart rate.  Blood pressure was 87/61 with a pulse of 100.  Patient was directed to ED after UC visit.  Patient denies any chest pain, shortness of breath, fevers, abdominal pain, nausea/vomiting, dysuria, new onset weakness, headache, vision changes, neck pain.  Foot Pain    Home Medications Prior to Admission medications   Medication Sig Start Date End Date Taking? Authorizing Provider  benztropine (COGENTIN) 1 MG tablet Take 1 mg by mouth 2 (two) times daily. 08/19/19   [provider]  busPIRone (BUSPAR) 15 MG tablet Take by mouth.    [provider]  FLUoxetine (PROZAC) 40 MG capsule Take 40 mg by mouth daily.    [provider]  gabapentin (NEURONTIN) 300 MG capsule Take 300 mg by mouth 3 (three) times daily.    [provider]  LORazepam (ATIVAN) 0.5 MG tablet Take by mouth. 10/07/22 01/05/23  [provider]  REXULTI 1 MG TABS tablet Take 1 mg by mouth 2 (two) times daily. 07/20/19   [provider]  sertraline (ZOLOFT) 50 MG tablet Take by mouth. 09/12/22 12/11/22  [provider]      Allergies    Patient has no known  allergies.    Review of Systems   Review of Systems See HPI Physical Exam Updated Vital Signs BP 102/60 (BP Location: Right Arm)   Pulse 80   Temp 98.1 F (36.7 C) (Oral)   Resp 18   SpO2 100%  Physical Exam Constitutional:      General: She is not in acute distress. Cardiovascular:     Rate and Rhythm: Normal rate and regular rhythm.     Pulses: Normal pulses.     Heart sounds: Normal heart sounds.  Pulmonary:     Effort: Pulmonary effort is normal. No respiratory distress.     Breath sounds: Normal breath sounds.  Abdominal:     Palpations: Abdomen is soft.     Tenderness: There is no abdominal tenderness. There is no guarding or rebound.  Musculoskeletal:     Cervical back: Normal range of motion.     Right lower leg: No edema.     Left lower leg: No edema.     Comments: Able to range bilateral ankles without difficulty When palpating bilateral calfs calves are soft however patient stated this exacerbated her pain  Skin:    General: Skin is warm and dry.     Capillary Refill: Capillary refill takes less than 2 seconds.     Comments: No overlying skin color changes  Neurological:     Mental Status: She is alert.  Comments: Sensation intact in all 4 limbs  Psychiatric:        Mood and Affect: Mood normal.     ED Results / Procedures / Treatments   Labs (all labs ordered are listed, but only abnormal results are displayed) Labs Reviewed  BASIC METABOLIC PANEL - Abnormal; Notable for the following components:      Result Value   BUN <5 (*)    All other components within normal limits  CBC WITH DIFFERENTIAL/PLATELET    EKG None  Radiology DG Chest 1 View  Result Date: 11/10/2022 CLINICAL DATA:  Hypotension, lower extremity edema for 2 weeks EXAM: CHEST  1 VIEW COMPARISON:  None Available. FINDINGS: Single frontal view of the chest demonstrates an unremarkable cardiac silhouette. No acute airspace disease, effusion, or pneumothorax. No acute bony  abnormalities. IMPRESSION: 1. No acute intrathoracic process. Electronically Signed   By: Sharlet Salina M.D.   On: 11/10/2022 19:51    Procedures Procedures    Medications Ordered in ED Medications  acetaminophen (TYLENOL) tablet 650 mg (650 mg Oral Given 11/10/22 1932)  sodium chloride 0.9 % bolus 500 mL (0 mLs Intravenous Stopped 11/10/22 2015)  sodium chloride 0.9 % bolus 500 mL (0 mLs Intravenous Stopped 11/10/22 2109)    ED Course/ Medical Decision Making/ A&P                             Medical Decision Making Amount and/or Complexity of Data Reviewed Labs: ordered. Radiology: ordered.   [name] 47 y.o. presented today for bilateral foot edema, hypotension. Working DDx that I considered at this time includes, but not limited to, CHF, anemia, electrolyte abnormalities, cirrhosis, renal failure, ACS, dissection, dependent edema, varicose veins.  R/o DDx: CHF, anemia, electrolyte abnormalities, cirrhosis, renal failure, ACS, dissection, dependent edema, varicose veins: These are considered less likely due to history of present illness and physical exam findings  Review of prior external notes: 11/07/2022 ED  Unique Tests and My Interpretation:  CBC with differential: Unremarkable BMP: Chest x-ray: No acute cardiopulmonary changes EKG: Sinus 83 bpm, no ST abnormalities or blocks noted  Discussion with Independent Historian: None  Discussion of Management of Tests: None  Risk: Medium: prescription drug management  Risk Stratification Score: none  Plan: Patient presented for bilateral leg swelling and low blood pressure. On exam patient was no acute distress and had stable vitals although her blood pressure was a little low at 100/63.  Patient denied any chest pain or shortness of breath and on exam I did not note any leg swelling.  Patient was seen in the ER recently for the same chief concern and diagnosed with dependent edema as she walks a lot and sleeps sitting  up up.  I highly suspect patient's reported edema is dependent edema however since patient did present with a softer blood pressure patient will be given fluids and basic labs, chest x-ray, EKG will be ordered.  Patient be given Tylenol as I suspect her calf muscles are sore from walking all day as she states she does walk all day around town.  Patient stable at this time.  Patient's labs are reassuring along with chest x-ray and EKG.  Patient stated after receiving Tylenol she felt much better.  Pending patient's blood pressure patient be discharged with outpatient follow-up.  Patient's repeat blood pressure was 102/66.  Patient be given another 500 of fluids and monitored.  After another round of fluids patient's  blood pressure stayed at the same.  Patient is currently resting and upon chart review usually has lower blood pressure.  Patient be discharged with outpatient follow-up and educated that she may take Tylenol every 6 hours as needed for pain and to avoid salty diet as this could exacerbate her leg swelling.  Patient was given return precautions. Patient stable for discharge at this time.  Patient verbalized understanding of plan.         Final Clinical Impression(s) / ED Diagnoses Final diagnoses:  Pain in both feet    Rx / DC Orders ED Discharge Orders     None         Remi Deter 11/10/22 2115    Lorre Nick, MD 11/11/22 1622

## 2022-11-10 NOTE — ED Triage Notes (Signed)
Pt states that both feet hurt and legs swollen x 2 weeks. She walks a lot.

## 2022-11-10 NOTE — ED Provider Notes (Signed)
[facility]    CSN: [id number] Arrival date & time: 11/10/22  1529      History   Chief Complaint Chief Complaint  Patient presents with   Foot Pain   Leg Swelling    HPI Yolanda [name] is a 47 y.o. female.   Patient presents today with a 2-week history of bilateral leg swelling and pain.  She reports that pain is rated 10 on a 0-10 pain scale, localized to bilateral lower legs, described as intense aching, worse with walking or manipulation of the leg.  She denies any known injury or increase in activity prior to symptom onset.  She was seen in the emergency room on 11/07/2022 at which point symptoms were attributed to dependent edema.  She reports that they have persisted and worsened prompting evaluation.  She denies history of heart failure, thyroid condition, chronic liver/kidney disease.  She does report having one of her bipolar medications changed shortly before the symptoms began but denies any additional medication changes.  She does report eating a significant amount of sodium in her diet as she often eats spaghetti with ketchup, hotdogs, potato chips.  This is baseline and she denies any sudden increase in her sodium consumption.  She does report a mild cough but denies any associated shortness of breath, palpitations, chest pain.  Denies history of VTE event.    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 Authorizing Provider  benztropine (COGENTIN) 1 MG tablet Take 1 mg by mouth 2 (two) times daily. 08/19/19  Yes [provider]  busPIRone (BUSPAR) 15 MG tablet Take by mouth.   Yes [provider]  FLUoxetine (PROZAC) 40 MG capsule Take 40 mg by mouth daily.   Yes [provider]  gabapentin (NEURONTIN) 300 MG capsule Take 300 mg by mouth 3 (three) times daily.   Yes [provider]  LORazepam (ATIVAN) 0.5 MG tablet Take by mouth. 10/07/22 01/05/23 Yes [provider]  REXULTI 1 MG TABS tablet Take 1 mg by mouth 2 (two) times daily. 07/20/19  Yes [provider]  sertraline (ZOLOFT) 50 MG tablet Take by mouth. 09/12/22 12/11/22 Yes [provider]    Family History History reviewed. No pertinent family history.  Social History Social History   Tobacco Use   Smoking status: Every Day  Substance Use Topics   Alcohol use: Yes   Drug use: Yes    Types: Cocaine, Marijuana     Allergies   Patient has no known allergies.   Review of Systems Review of Systems  Constitutional:  Positive for activity change. Negative  for appetite change, fatigue and fever.  Respiratory:  Positive for cough. Negative for shortness of breath.   Cardiovascular:  Positive for leg swelling. Negative for chest pain and palpitations.  Gastrointestinal:  Negative for abdominal pain, diarrhea, nausea and vomiting.     Physical Exam Triage Vital Signs ED Triage Vitals  Enc Vitals Group     BP 11/10/22 1635 (!) 90/58     Pulse Rate 11/10/22 1635 (!) 108     Resp 11/10/22 1635 20     Temp 11/10/22 1635 97.9 F (36.6 C)     Temp Source 11/10/22 1635 Oral     SpO2 11/10/22 1635 95 %     Weight 11/10/22 1634 142 lb (64.4 kg)     Height --      Head Circumference --      Peak Flow --      Pain Score 11/10/22 1634 10     Pain Loc --      Pain Edu? --      Excl. in GC? --    No data found.  Updated Vital Signs BP (!) 87/61   Pulse 100    Temp 97.9 F (36.6 C) (Oral)   Resp 18   Wt 142 lb (64.4 kg)   SpO2 95%   BMI 28.68 kg/m   Visual Acuity Right Eye Distance:   Left Eye Distance:   Bilateral Distance:    Right Eye Near:   Left Eye Near:    Bilateral Near:     Physical Exam Vitals reviewed.  Constitutional:      General: She is awake. She is not in acute distress.    Appearance: Normal appearance. She is well-developed. She is not ill-appearing.     Comments: Very pleasant female appears stated age in no acute distress sitting comfortably in exam room  HENT:     Head: Normocephalic and atraumatic.  Cardiovascular:     Rate and Rhythm: Regular rhythm. Tachycardia present.     Heart sounds: Normal heart sounds, S1 normal and S2 normal. No murmur heard.    Comments: Nonpitting edema to mid anterior tibia bilaterally. Pulmonary:     Effort: Pulmonary effort is normal.     Breath sounds: Examination of the left-lower field reveals rales. Rales present. No wheezing or rhonchi.  Musculoskeletal:     Right lower leg: Tenderness present. No bony tenderness.     Left lower leg: Tenderness present. No bony tenderness.     Comments: Lower legs: Significant tenderness palpation of posterior lower leg bilaterally without deformity.  Normal active range of motion at ankle and knee.  Foot neurovascularly intact.  Psychiatric:        Behavior: Behavior is cooperative.      UC Treatments / Results  Labs (all labs ordered are listed, but only abnormal results are displayed) Labs Reviewed - No data to display  EKG   Radiology No results found.  Procedures Procedures (including critical care time)  Medications Ordered in UC Medications - No data to display  Initial Impression / Assessment and Plan / UC Course  I have reviewed the triage vital signs and the nursing notes.  Pertinent labs & imaging results that were available during my care of the patient were reviewed by me and considered in my medical  decision making (see chart for details).     Patient was initially mildly hypotensive and tachycardic on intake which is changed from her baseline.  EKG was obtained that showed  sinus rhythm with short PR interval but without ischemic changes; compared to 11/23/2019 tracing nonspecific ST changes in lead III and earlier R wave progression.  Difficult comparison given poor quality of tracing in our clinic today.  Repeat blood pressure remained low and we discussed that I recommend she go to the emergency room for further evaluation and management since we do not have access to stat labs or imaging.  She was ultimately agreeable.  Transport was called as she did not have anyone to take her to the emergency room.  She was stable at the time of discharge.  Final Clinical Impressions(s) / UC Diagnoses   Final diagnoses:  Hypotension, unspecified hypotension type  Leg swelling   Discharge Instructions   None    ED Prescriptions   None    PDMP not reviewed this encounter.   Jeani Hawking, PA-C 11/10/22 1830

## 2022-11-10 NOTE — ED Notes (Signed)
Pt care taken, iv placed, blood work taken, assisted with patient ambulation to the bathroom. No other complaints.

## 2022-11-10 NOTE — Discharge Instructions (Signed)
Please follow-up with your primary care provider I have attached your for you regarding recent symptoms and ER visit.  Today your labs and imaging were reassuring and your blood pressure is safe to be discharged.  Please take in plenty of fluids and avoid salty diets as this may exacerbate your leg swelling.  You may take Tylenol every 6 hours needed for pain.  If symptoms worsen please return to ER.

## 2022-11-12 ENCOUNTER — Other Ambulatory Visit: Payer: Self-pay

## 2022-11-12 ENCOUNTER — Emergency Department (HOSPITAL_COMMUNITY)
Admission: EM | Admit: 2022-11-12 | Discharge: 2022-11-13 | Disposition: A | Discharge status: Home / Self Care | Payer: Medicaid Other | Attending: Emergency Medicine | Admitting: Emergency Medicine

## 2022-11-12 DIAGNOSIS — R600 Localized edema: Secondary | ICD-10-CM | POA: Insufficient documentation

## 2022-11-12 DIAGNOSIS — R45851 Suicidal ideations: Secondary | ICD-10-CM | POA: Diagnosis not present

## 2022-11-12 DIAGNOSIS — F17200 Nicotine dependence, unspecified, uncomplicated: Secondary | ICD-10-CM | POA: Diagnosis not present

## 2022-11-12 DIAGNOSIS — F3130 Bipolar disorder, current episode depressed, mild or moderate severity, unspecified: Secondary | ICD-10-CM | POA: Insufficient documentation

## 2022-11-12 DIAGNOSIS — F319 Bipolar disorder, unspecified: Secondary | ICD-10-CM | POA: Diagnosis present

## 2022-11-12 LAB — I-STAT CHEM 8, ED
BUN: 9 mg/dL (ref 6–20)
Calcium, Ion: 1.2 mmol/L (ref 1.15–1.40)
Chloride: 105 mmol/L (ref 98–111)
Creatinine, Ser: 0.8 mg/dL (ref 0.44–1.00)
Glucose, Bld: 66 mg/dL — ABNORMAL LOW (ref 70–99)
HCT: 37 % (ref 36.0–46.0)
Hemoglobin: 12.6 g/dL (ref 12.0–15.0)
Potassium: 4.1 mmol/L (ref 3.5–5.1)
Sodium: 142 mmol/L (ref 135–145)
TCO2: 28 mmol/L (ref 22–32)

## 2022-11-12 LAB — I-STAT BETA HCG BLOOD, ED (MC, WL, AP ONLY): I-stat hCG, quantitative: <5 m[IU]/mL (ref <5)

## 2022-11-12 MED ORDER — LACTATED RINGERS IV BOLUS
1000.0000 mL | Freq: Once | INTRAVENOUS | Status: AC
  Administered 2022-11-12: 1000 mL via INTRAVENOUS

## 2022-11-12 MED ORDER — GABAPENTIN 300 MG PO CAPS
300.0000 mg | ORAL_CAPSULE | Freq: Three times a day (TID) | ORAL | Status: DC
  Administered 2022-11-12: 300 mg via ORAL
  Filled 2022-11-12: qty 1

## 2022-11-12 MED ORDER — SERTRALINE HCL 50 MG PO TABS
25.0000 mg | ORAL_TABLET | Freq: Every day | ORAL | Status: DC
  Administered 2022-11-12 – 2022-11-13 (×2): 25 mg via ORAL
  Filled 2022-11-12 (×2): qty 1

## 2022-11-12 MED ORDER — BENZTROPINE MESYLATE 0.5 MG PO TABS
1.0000 mg | ORAL_TABLET | Freq: Two times a day (BID) | ORAL | Status: DC
  Administered 2022-11-12: 1 mg via ORAL
  Filled 2022-11-12: qty 2

## 2022-11-12 NOTE — ED Notes (Signed)
Patient talking with counselor via tele monitor., [facility]

## 2022-11-12 NOTE — BH Assessment (Addendum)
Comprehensive Clinical Assessment (CCA) Note  11/12/2022 Yolanda [name] [id number] Disposition: Clinician discussed patient care with Rockney Ghee, NP.  She recommended overnight observation of patient.  Pt to be seen by provider on 05/07.    Pt is drowsy and has poor eye contact.  She is not oriented to time.  She is minimally engaged during assessment, giving 1-2 word responses.  Patient does say she hears voices but cannot tell what they say.  Pt is not responding to internal stimuli during assessment.  Pt is unclear about how much sleep she gets but says her appetite is normal.    Pt has been to [facility] inpatient care in the past.  She has no current outpatient provider.     Chief Complaint:  Chief Complaint  Patient presents with   Suicidal   Hypotension   Visit Diagnosis: MDD recurrent, severe    CCA Screening, Triage and Referral (STR)  Patient Reported Information How did you hear about [place]? Self  What Is the Reason for Your Visit/Call Today? Pt came to [facility] on her own because of foot and back pain which is chronic.  Pt also has been thinking about killing herself.  She denies any current plan.  Patient has had multiple attempts to kill herself.  She has had multiple admissions to [facility] but it has been over 12 years ago.  Pt denies any HI or visual hallucinations.  Patient says she hears voices but is unclear about what they say.  Patient denies any use of ETOH, THC, cocaine.  She denies any access to weapons.  Patient has no outpatient care at this time.  Patient reports appetite is WNL and her sleep is okay depending on where she can stay.  How Long Has This Been Causing You Problems? <Week  What Do You Feel Would Help You the Most Today? Treatment for Depression or other mood problem   Have You Recently Had Any Thoughts About Hurting Yourself? Yes  Are You Planning to Commit Suicide/Harm Yourself At This time? No   Flowsheet Row ED from 11/12/2022 in [facility]  Emergency Department at [facility] Most recent reading at 11/12/2022  3:11 PM ED from 11/10/2022 in [facility] at [facility] Most recent reading at 11/10/2022  6:13 PM ED from 11/10/2022 in [facility] Urgent Care at [facility] Most recent reading at 11/10/2022  4:34 PM  C-SSRS RISK CATEGORY Moderate Risk No Risk No Risk       Have you Recently Had Thoughts About Hurting Someone Karolee Ohs? No  Are You Planning to Harm Someone at This Time? No  Explanation: Pt with SI but no plan.  Multiple past attempts.  No HI.  Hearing voices.   Have You Used Any Alcohol or Drugs in the Past 24 Hours? No  What Did You Use and How Much? Pt denies   Do You Currently Have a Therapist/Psychiatrist? No  Name of Therapist/Psychiatrist: Name of Therapist/Psychiatrist: No one   Have You Been Recently Discharged From Any Office Practice or Programs? No  Explanation of Discharge From Practice/Program: No recent discharges     CCA Screening Triage Referral Assessment Type of Contact: Tele-Assessment  Telemedicine Service Delivery:   Is this Initial or Reassessment? Is this Initial or Reassessment?: Initial Assessment  Date Telepsych consult ordered in [facility]:  Date Telepsych consult ordered in [facility]: 11/12/22  Time Telepsych consult ordered in [facility]:  Time Telepsych consult ordered in [facility]: 1531  Location of  Assessment: [facility] ED  Provider Location: [facility] Services   Collateral Involvement: None   Does Patient Have a Automotive engineer Guardian? No  Legal Guardian Contact Information: Pt does not have a legal guardian  Copy of Legal Guardianship Form: -- (Pt does not have a legal guardian)  Legal Guardian Notified of Arrival: -- (Pt does not have a legal guardian)  Legal Guardian Notified of Pending Discharge: -- (Pt does not have a legal guardian)  If Minor and Not Living with Parent(s), Who has Custody? N/A  Is CPS involved or ever been involved?  Never  Is APS involved or ever been involved? Never   Patient Determined To Be At Risk for Harm To Self or Others Based on Review of Patient Reported Information or Presenting Complaint? Yes, for Self-Harm (SI w/ no plan.)  Method: No Plan  Availability of Means: No access or NA  Intent: -- (SI w/ no plan.  No HI.)  Notification Required: No need or identified person  Additional Information for Danger to Others Potential: -- (Pt denies any HI.)  Additional Comments for Danger to Others Potential: Patient has no HI.  Are There Guns or Other Weapons in Your Home? No  Types of Guns/Weapons: Pt has no guns, no home.  Are These Weapons Safely Secured?                            No  Who Could Verify You Are Able To Have These Secured: No weapons to safely secure.  Do You Have any Outstanding Charges, Pending Court Dates, Parole/Probation? Pt denies any legal issues at this time.  Contacted To Inform of Risk of Harm To Self or Others: Other: Comment (Pt came to [facility] voluntarily.  No need to inform others of HI because there is none at this time.)    Does Patient Present under Involuntary Commitment? No    [place] of Residence: [place] (Homeless in [place])   Patient Currently Receiving the Following Services: Not Receiving Services   Determination of Need: Urgent (48 hours)   Options For Referral: Other: Comment (Overnight observation per Rockney Ghee, NP)     CCA Biopsychosocial Patient Reported Schizophrenia/Schizoaffective Diagnosis in Past: Yes   Strengths: "I can't think of any right now."   Mental Health Symptoms Depression:   Hopelessness; Worthlessness; Difficulty Concentrating; Change in energy/activity; Tearfulness   Duration of Depressive symptoms:  Duration of Depressive Symptoms: Greater than two weeks   Mania:   None   Anxiety:    Tension; Difficulty concentrating   Psychosis:   Hallucinations   Duration of Psychotic  symptoms:  Duration of Psychotic Symptoms: Greater than six months   Trauma:   N/A   Obsessions:   N/A   Compulsions:   N/A   Inattention:   N/A   Hyperactivity/Impulsivity:   None   Oppositional/Defiant Behaviors:   None   Emotional Irregularity:   Chronic feelings of emptiness   Other Mood/Personality Symptoms:   Pt said she has dx of schizophrenia    Mental Status Exam Appearance and self-care  Stature:   Small   Weight:   Average weight   Clothing:   Casual (Pt in scrubs.)   Grooming:   Neglected   Cosmetic use:   None   Posture/gait:   Normal   Motor activity:   Slowed   Sensorium  Attention:   Inattentive (Sleepy during assessment)   Concentration:   Variable (  Pt is drowsy.)   Orientation:   Situation; Place; Person; Object   Recall/memory:   Defective in Immediate; Defective in Short-term   Affect and Mood  Affect:   Blunted; Flat (Drowsy)   Mood:   Other (Comment) (Drowsy.)   Relating  Eye contact:   Fleeting   Facial expression:   Sad   Attitude toward examiner:   Cooperative (Minimally engaged.)   Thought and Language  Speech flow:  Paucity; Slow; Soft   Thought content:   Appropriate to Mood and Circumstances   Preoccupation:   None   Hallucinations:   Auditory (Says she hears voices.)   Organization:   Other (Comment) Industrial/product designer)   Company secretary of Knowledge:   Average   Intelligence:   Average   Abstraction:   Normal   Judgement:   Poor   Reality Testing:   Adequate   Insight:   Poor; Shallow   Decision Making:   Paralyzed   Social Functioning  Social Maturity:   Responsible   Social Judgement:   -- Industrial/product designer)   Stress  Stressors:   Surveyor, quantity (Being homeless)   Coping Ability:   Contractor Deficits:   Building services engineer   Supports:   Support needed     Religion: Religion/Spirituality Are You A Religious Person?: No How Might This Affect  Treatment?: No affect on treatment.  Leisure/Recreation: Leisure / Recreation Do You Have Hobbies?: No (UTA)  Exercise/Diet: Exercise/Diet Do You Exercise?: No (UTA) Have You Gained or Lost A Significant Amount of Weight in the Past Six Months?: Yes-Gained Number of Pounds Gained:  (Pt does not know) Do You Follow a Special Diet?: No Do You Have Any Trouble Sleeping?: No   CCA Employment/Education Employment/Work Situation: Employment / Work Systems developer: On disability Why is Patient on Disability: Mental health How Long has Patient Been on Disability: 5 years Patient's Job has Been Impacted by Current Illness: No Has Patient ever Been in the [employer]?: No  Education: Education Is Patient Currently Attending School?: No Last Grade Completed: 11 Did You Attend College?: No Did You Have An Individualized Education Program (IIEP): No Did You Have Any Difficulty At School?: No Patient's Education Has Been Impacted by Current Illness: No   CCA Family/Childhood History Family and Relationship History: Family history Marital status: Single Does patient have children?: Yes How many children?: 2 How is patient's relationship with their children?: "Okay"  Childhood History:  Childhood History By whom was/is the patient raised?: Mother Did patient suffer any verbal/emotional/physical/sexual abuse as a child?: No Did patient suffer from severe childhood neglect?: No Has patient ever been sexually abused/assaulted/raped as an adolescent or adult?: No Was the patient ever a victim of a crime or a disaster?: No Witnessed domestic violence?: No Has patient been affected by domestic violence as an adult?: No       CCA Substance Use Alcohol/Drug Use: Alcohol / Drug Use Pain Medications: Pt denies current medications Prescriptions: Pt denies current medications Over the Counter: Pt denies current medications History of alcohol / drug use?: No history of  alcohol / drug abuse                         ASAM's:  Six Dimensions of Multidimensional Assessment  Dimension 1:  Acute Intoxication and/or Withdrawal Potential:      Dimension 2:  Biomedical Conditions and Complications:      Dimension 3:  Emotional, Behavioral, or  Cognitive Conditions and Complications:     Dimension 4:  Readiness to Change:     Dimension 5:  Relapse, Continued use, or Continued Problem Potential:     Dimension 6:  Recovery/Living Environment:     ASAM Severity Score:    ASAM Recommended Level of Treatment:     Substance use Disorder (SUD)    Recommendations for Services/Supports/Treatments:    Discharge Disposition:    DSM5 Diagnoses: Patient Active Problem List   Diagnosis Date Noted   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

## 2022-11-12 NOTE — ED Provider Notes (Signed)
[place] EMERGENCY DEPARTMENT AT [facility] Provider Note   CSN: [id number] Arrival date & time: 11/12/22  1447     History {Add pertinent medical, surgical, social history, OB history to HPI:1} Chief Complaint  Patient presents with   Suicidal   Hypotension    Yolanda [name] is a 47 y.o. female.  HPI Patient presents with concern of ongoing bilateral foot pain and swelling.  Patient is coming from [facility], is homeless.  She now complains of suicidal ideation without a discrete plan as well.  She notes that she has had this in the past, has been seen, evaluated by behavioral health, but none recently. No new complaints.     Home Medications Prior to Admission medications   Medication Sig Start Date End Date Taking? Authorizing Provider  benztropine (COGENTIN) 1 MG tablet Take 1 mg by mouth 2 (two) times daily. 08/19/19   [provider]  busPIRone (BUSPAR) 15 MG tablet Take by mouth.    [provider]  FLUoxetine (PROZAC) 40 MG capsule Take 40 mg by mouth daily.    [provider]  gabapentin (NEURONTIN) 300 MG capsule Take 300 mg by mouth 3 (three) times daily.    [provider]  LORazepam (ATIVAN) 0.5 MG tablet Take by mouth. 10/07/22 01/05/23  [provider]  REXULTI 1 MG TABS tablet Take 1 mg by mouth 2 (two) times daily. 07/20/19   [provider]  sertraline (ZOLOFT) 50 MG tablet Take by mouth. 09/12/22 12/11/22  [provider]      Allergies    Patient has no known allergies.    Review of Systems   Review of Systems  All other systems reviewed and are negative.   Physical Exam Updated Vital Signs BP 98/60 (BP Location: Left Arm)   Pulse (!) 102   Temp 98 F (36.7 C) (Oral)   Resp 17   Ht 4\' 11"  (1.499 m)   Wt 65 kg   SpO2 98%   BMI 28.94 kg/m  Physical Exam Vitals and nursing note reviewed.  Constitutional:      General: She is not in acute distress.    Appearance: She is  well-developed.  HENT:     Head: Normocephalic and atraumatic.  Eyes:     Conjunctiva/sclera: Conjunctivae normal.  Pulmonary:     Effort: Pulmonary effort is normal. No respiratory distress.     Breath sounds: No stridor.  Abdominal:     General: There is no distension.  Musculoskeletal:     Right lower leg: Edema present.     Left lower leg: Edema present.  Skin:    General: Skin is warm and dry.  Neurological:     Mental Status: She is alert and oriented to person, place, and time.     Cranial Nerves: No cranial nerve deficit.  Psychiatric:        Mood and Affect: Mood normal.     Comments: Tearful affect, describing suicidal thoughts, without discrete plan.     ED Results / Procedures / Treatments   Labs (all labs ordered are listed, but only abnormal results are displayed) Labs Reviewed  COMPREHENSIVE METABOLIC PANEL  ETHANOL  SALICYLATE LEVEL  ACETAMINOPHEN LEVEL  CBC  RAPID URINE DRUG SCREEN, HOSP PERFORMED  I-STAT BETA HCG BLOOD, ED ([facility], [facility], [facility] ONLY)    EKG None  Radiology DG Chest 1 View  Result Date: 11/10/2022 CLINICAL DATA:  Hypotension, lower extremity edema for 2 weeks EXAM: CHEST  1 VIEW COMPARISON:  None Available. FINDINGS: Single frontal view of the chest demonstrates an unremarkable cardiac silhouette. No acute airspace disease, effusion, or pneumothorax. No acute bony abnormalities. IMPRESSION: 1. No acute intrathoracic process. Electronically Signed   By: Sharlet Salina M.D.   On: 11/10/2022 19:51    Procedures Procedures  {Document cardiac monitor, telemetry assessment procedure when appropriate:1}  Medications Ordered in ED Medications  lactated ringers bolus 1,000 mL (has no administration in time range)    ED Course/ Medical Decision Making/ A&P   {   Click here for ABCD2, HEART and other calculatorsREFRESH Note before signing :1}                          Medical Decision Making Adult female with homelessness, bipolar disorder,  chronic pain including bilateral lower extremity pain and swelling recently evaluated multiple recent times, now presents with similar concern but with new suicidal ideation.  Patient is mildly hypotensive on arrival, though with appropriate MAP, no tachycardia, no fever, little initial suspicion for infection and vitals are similar to multiple prior visits. Patient had evaluation including labs, which were similar, was medically cleared for behavioral health evaluation.  Amount and/or Complexity of Data Reviewed External Data Reviewed: notes.    Details: Notes from last 2 ED visits included below Labs: ordered. Decision-making details documented in ED Course.  Risk Prescription drug management. Decision regarding hospitalization.  ED 5/3 Yolanda [name] is a 47 y.o. female history of adjustment disorder presented with bilateral feet swelling/pain, low blood pressures with high heart rate.  Patient's had bilateral feet swelling for the past few weeks and has been evaluated in the ED before diagnosed with foot swelling.  Patient states the pain is exacerbated when she walks and that is a sharp pain that stays in her calf muscles.  Patient states she does have a high salt diet but that she also does walk a lot.  Patient went to the urgent care today to be evaluated for her chronic bilateral feet pain however upon arrival had low blood pressure and high heart rate.  Blood pressure was 87/61 with a pulse of 100.  Patient was directed to ED after UC visit.  Patient denies any chest pain, shortness of breath, fevers, abdominal pain, nausea/vomiting, dysuria, new onset weakness, headache, vision changes, neck pain.   ED 5/1 47 yo F with a chief complaint of bilateral leg swelling. Going on for a few days now. When asked her if anything is changed recently she told me that she has been living in a shelter and has been sleeping sitting up. She denies chest pain denies difficulty breathing denies orthopnea  or PND. Final Clinical Impression(s) / ED Diagnoses Final diagnoses:  Suicidal ideation  Lower extremity edema    Rx / DC Orders ED Discharge Orders     None

## 2022-11-12 NOTE — ED Notes (Signed)
2 bags of patient belongings were placed at the 1-8 nurses station.

## 2022-11-12 NOTE — ED Notes (Signed)
Patient belongings moved to locker 37.

## 2022-11-12 NOTE — ED Provider Notes (Signed)
TTS with plan for overnight observation and reevaluation.   Wynetta Fines, MD 11/12/22 2325

## 2022-11-12 NOTE — ED Triage Notes (Signed)
Pt BIBA from [facility]. Pt c/o SI w/out plan, and chronic foot and back pain. Pt is hypotensive in triage w/ BP of 88/67.  PT AOx4

## 2022-11-12 NOTE — ED Triage Notes (Signed)
Patient BIB [facility] from [facility] Suicidal for 2 days. Down about life. No plan right now. History of SI, where patient jumped off a bridge. Has chronic hand, leg, back pain. Bipolar and has been off her medications x1 week.

## 2022-11-13 DIAGNOSIS — F319 Bipolar disorder, unspecified: Secondary | ICD-10-CM | POA: Diagnosis present

## 2022-11-13 DIAGNOSIS — R45851 Suicidal ideations: Secondary | ICD-10-CM

## 2022-11-13 MED ORDER — TRAZODONE HCL 100 MG PO TABS: 100.0000 mg | ORAL_TABLET | Freq: Every evening | ORAL | Status: DC | PRN

## 2022-11-13 NOTE — Progress Notes (Signed)
Pt was accepted to [facility] TODAY 11/13/2022. Bed assignment: [facility] unit  Pt meets inpatient criteria per Dahlia Byes, NP  Attending Physician will be Joylene Igo, AGPCNP  Report can be called to: [phone] (please wait to call report until transport is here to take pt)  Bed is ready now  Care Team Notified: Dahlia Byes, NP, Lum Babe, RN, and [address] LCSWA  [place], [place]  11/13/2022 3:34 PM

## 2022-11-13 NOTE — ED Provider Notes (Signed)
Emergency Medicine Observation Re-evaluation Note  Yolanda [name] is a 47 y.o. female, seen on rounds today.  Pt initially presented to the ED for complaints of Suicidal and Hypotension   Physical Exam  BP 101/67 (BP Location: Left Arm)   Pulse 82   Temp 99.2 F (37.3 C) (Oral)   Resp 18   Ht 4\' 11"  (1.499 m)   Wt 65 kg   SpO2 99%   BMI 28.94 kg/m  Physical Exam General: NAD Cardiac: Regular HR Lungs: No respiratory distress   ED Course / MDM  EKG:   I have reviewed the labs performed to date as well as medications administered while in observation.    Plan  Current plan is for inpatient psychiatric placement    Terald Sleeper, MD 11/13/22 1451

## 2022-11-13 NOTE — ED Notes (Signed)
Patient resting comfortably.  No s/s of distress at this time.  Patient medication compliant this AM.

## 2022-11-13 NOTE — ED Notes (Signed)
Pt ambulatory from [facility], left with safe transport

## 2022-11-13 NOTE — Progress Notes (Signed)
LCSW Progress Note  [id number]   Yolanda [name]  11/13/2022  2:38 PM  Description:   Inpatient Psychiatric Referral  Patient was recommended inpatient per Dahlia Byes, NP. There are no available beds at [facility], per [facility] Rona Ravens, RN. Patient was referred to the following out of network facilities:   [facility] Provider Address Phone Fax  [facility]  [address] [place] [address] [phone]  [facility]  [address] [place] [address] [phone]  [facility] [address]  [address], [place] [address] [phone]  [facility]  [address] [place] [address] [phone]  [facility]  [phone] [place] [address] [phone]  [facility]  [address] [place], [place] [address] [phone]  [facility]  [address] [place] [address] [phone]  [facility]  [address], [place] [address] [phone]  [facility]  [address] [place] [address] [phone]  [facility]  [address], [place] [address] [phone]  [facility]  [address] [place] [address] [phone]  [facility]  [address], [place] [address] [phone]  [facility]  [address], [place] [address] [phone]  [facility]  [address] [place] [address] [phone]  [facility]  [address], [place] [address] [phone]  [facility]  [address] [place], [place] [address] [phone]  [facility]  [address], [place] [address] [phone]  [facility]  [address] [place], [place] [address] [phone]  [facility] [place] [address] [phone]  [facility]  [address] [place] [address] [phone]  [facility]  [address], [place] [address] [phone]  [facility]  [address] [place] [address] [phone]    Situation ongoing, CSW to continue following and update chart as more information becomes available.      Asencion Islam  11/13/2022 2:38 PM

## 2022-11-13 NOTE — ED Notes (Addendum)
VS updated and paperwork sent with pt

## 2022-11-13 NOTE — Consult Note (Signed)
[facility] ED ASSESSMENT   Reason for Consult:  Psychiatry evaluation Referring Physician:  ER Physician Patient Identification: Yolanda [name] MRN:  [id number] ED Chief Complaint: Bipolar 1 disorder, depressed (HCC)  Diagnosis:  Principal Problem:   Bipolar 1 disorder, depressed (HCC) Active Problems:   Suicide ideation   ED Assessment Time Calculation: Start Time: 1111 Stop Time: 1137 Total Time in Minutes (Assessment Completion): 26   Subjective:   Yolanda [name] is a 47 y.o. female patient admitted with previous hx of Schizoaffective disorder, Bipolar type, .Generalized anxiety disorder, Major Depression and suicide attempts in the past brought in from [facility] with c/o two days of feeling suicidal.  She was recently discharged from [facility] last month.  Was also hospitalized at [facility] in January.  Patient made minimal eye contact, difficult to engage with provider.  HPI:  Patient was seen this morning and she voiced feeling depressed.  Patient rated depression 9/10 with 10 being severe depression.  Patient admitted not taking her medications for a week.  Patient would not say when and if she received injection Invega as planned.  Patient reports her Medications are not effective but could not name any of her medications.  Review of charts shows she has had two inpatient Psychiatry hospitalizations this year.  Patient remains passively suicidal with no plan but stated that she was having strong suicide thought and ideation.  [place].  Patient reports poor sleep but good appetite and no food to eat.  She receives Food stamps and Disability checks she says.  Patient denies HI/AVH at this time.  Record shows patient is supposed to take Invega injection, Remeron. AA Female, 47 years old disheveled, unkempt and presents depressed affect, reports feeling depressed and having passive suicide thought/ideation.  Patient has hx  of Multiple suicide attempts by OD and self harm behavior by cutting wrist.  She has had multiple inpatient Mental healthcare hospitalizations. Patient meets criteria for inpatient Psychiatry hospitalization.  We will seek bed placement at any facility with available bed.    Past Psychiatric History: previous hx of Schizoaffective disorder, Bipolar type, .Generalized anxiety disorder, Major Depression and suicide attempts.  Record shows unspecified intellectual disability.  Patient was supposed to go to [facility] for outpatient care.  She is supposed to be on Monthly Invega injection as well.  Risk to Self or Others: Is the patient at risk to self? Yes Has the patient been a risk to self in the past 6 months? Yes Has the patient been a risk to self within the distant past? Yes Is the patient a risk to others? No Has the patient been a risk to others in the past 6 months? No Has the patient been a risk to others within the distant past? No  [place] Scale:  Flowsheet Row ED from 11/12/2022 in [facility] Emergency Department at [facility] Most recent reading at 11/12/2022  3:11 PM ED from 11/10/2022 in [facility] Emergency Department at [name] Most recent reading at 11/10/2022  6:13 PM ED from 11/10/2022 in [facility] [place] Most recent reading at 11/10/2022  4:34 PM  C-SSRS RISK CATEGORY Moderate Risk No Risk No Risk       AIMS:  , , ,  ,   ASAM:    Substance Abuse:  Alcohol / Drug Use Pain Medications: Pt denies current medications Prescriptions: Pt denies current medications Over  the Counter: Pt denies current medications History of alcohol / drug use?: No history of alcohol / drug abuse  Past Medical History:  Past Medical History:  Diagnosis Date   Bipolar affect, depressed (HCC)    Depression     Past Surgical History:  Procedure Laterality Date   NO PAST SURGERIES     SALPINGECTOMY     Family History: No family history on  file. Family Psychiatric  History: unknown Social History:  Social History   Substance and Sexual Activity  Alcohol Use Yes     Social History   Substance and Sexual Activity  Drug Use Yes   Types: Cocaine, Marijuana    Social History   Socioeconomic History   Marital status: Single    Spouse name: Not on file   Number of children: Not on file   Years of education: Not on file   Highest education level: Not on file  Occupational History   Not on file  Tobacco Use   Smoking status: Every Day   Smokeless tobacco: Not on file  Substance and Sexual Activity   Alcohol use: Yes   Drug use: Yes    Types: Cocaine, Marijuana   Sexual activity: Not on file  Other Topics Concern   Not on file  Social History Narrative   Not on file   Social Determinants of Health   Financial Resource Strain: Not on file  Food Insecurity: Not on file  Transportation Needs: Not on file  Physical Activity: Not on file  Stress: Not on file  Social Connections: Not on file   Additional Social History:    Allergies:  No Known Allergies  Labs:  Results for orders placed or performed during the hospital encounter of 11/12/22 (from the past 48 hour(s))  I-Stat beta hCG blood, ED     Status: None   Collection Time: 11/12/22  4:34 PM  Result Value Ref Range   I-stat hCG, quantitative <5.0 <5 mIU/mL   Comment 3            Comment:   GEST. AGE      CONC.  (mIU/mL)   <=1 WEEK        5 - 50     2 WEEKS       50 - 500     3 WEEKS       100 - 10,000     4 WEEKS     1,000 - 30,000        FEMALE AND NON-PREGNANT FEMALE:     LESS THAN 5 mIU/mL   I-stat chem 8, ED     Status: Abnormal   Collection Time: 11/12/22  4:35 PM  Result Value Ref Range   Sodium 142 135 - 145 mmol/L   Potassium 4.1 3.5 - 5.1 mmol/L   Chloride 105 98 - 111 mmol/L   BUN 9 6 - 20 mg/dL   Creatinine, Ser 1.61 0.44 - 1.00 mg/dL   Glucose, Bld 66 (L) 70 - 99 mg/dL    Comment: Glucose reference range applies only to samples  taken after fasting for at least 8 hours.   Calcium, Ion 1.20 1.15 - 1.40 mmol/L   TCO2 28 22 - 32 mmol/L   Hemoglobin 12.6 12.0 - 15.0 g/dL   HCT 09.6 04.5 - 40.9 %    Current Facility-Administered Medications  Medication Dose Route Frequency Provider Last Rate Last Admin   sertraline (ZOLOFT) tablet 25 mg  25 mg Oral Daily  Gerhard Munch, MD   25 mg at 11/12/22 1659   Current Outpatient Medications  Medication Sig Dispense Refill   paliperidone (INVEGA SUSTENNA) 156 MG/ML SUSY injection Inject 156 mg into the muscle once.     busPIRone (BUSPAR) 15 MG tablet Take by mouth.     sertraline (ZOLOFT) 50 MG tablet Take 150 mg by mouth daily. (Patient not taking: Reported on 11/12/2022)     traZODone (DESYREL) 100 MG tablet Take 100 mg by mouth at bedtime as needed for sleep. (Patient not taking: Reported on 11/12/2022)      Musculoskeletal: Strength & Muscle Tone:  in bed during my interaction with her Gait & Station:  in bed during my interaction with her Patient leans:  see above   Psychiatric Specialty Exam: Presentation  General Appearance:  Disheveled; Casual  Eye Contact: Minimal  Speech: Pressured  Speech Volume: Decreased (whispering at times.)  Handedness: Right   Mood and Affect  Mood: Depressed; Anxious; Irritable  Affect: Congruent; Depressed   Thought Process  Thought Processes: Coherent; Linear  Descriptions of Associations:Intact  Orientation:Full (Time, Place and Person)  Thought Content:Logical  History of Schizophrenia/Schizoaffective disorder:Yes  Duration of Psychotic Symptoms:Greater than six months  Hallucinations:Hallucinations: None  Ideas of Reference:None  Suicidal Thoughts:Suicidal Thoughts: Yes, Passive SI Passive Intent and/or Plan: Without Plan  Homicidal Thoughts:Homicidal Thoughts: No   Sensorium  Memory: Immediate Good; Recent Fair; Remote Fair  Judgment: Impaired  Insight: Shallow   Executive Functions   Concentration: Fair  Attention Span: Fair  Recall: Fair  Fund of Knowledge: Fair  Language: Fair   Psychomotor Activity  Psychomotor Activity: Psychomotor Activity: Psychomotor Retardation   Assets  Assets: Communication Skills; Social Support    Sleep  Sleep: Sleep: Fair   Physical Exam: Physical Exam-Patient is non contributory ROS-No participation, hesitant answering questions. Blood pressure 109/68, pulse 78, temperature 98.7 F (37.1 C), temperature source Oral, resp. rate 17, height 4\' 11"  (1.499 m), weight 65 kg, SpO2 98 %. Body mass index is 28.94 kg/m.  Medical Decision Making: Meets criteria for inpatient Psychiatry care.  We will seek bed placement at any facility with available beds.  We will obtain current list of Medications and resume so.  Problem 1: Bipolar disorder, depressed  Problem 2: Suicide ideation.  Disposition:  admit, seek bed placement.  Earney Navy, NP-PMHNP-BC 11/13/2022 11:42 AM

## 2022-11-13 NOTE — ED Notes (Signed)
Patient sleeping. Breaths equal and unlabored.  

## 2022-11-13 NOTE — ED Notes (Signed)
Report given to Burgen at [facility]. Safe transport here to transport pt

## 2022-11-13 NOTE — ED Provider Notes (Signed)
Patient is stable for transfer. He was resting in his room when I assessed him around 7 pm.    Derwood Kaplan, MD 11/13/22 2004

## 2022-11-13 NOTE — ED Notes (Addendum)
Pt resting in room at this time.

## 2022-11-13 NOTE — Progress Notes (Signed)
This CSW received a phone call from [facility] requesting to offer a bed. CSW advised pt has obtained placement at this time.   Maryjean Ka, MSW, [facility] 11/13/2022 5:57 PM

## 2022-11-13 NOTE — ED Notes (Signed)
Patient awake. Patient eating dinner. Cooperative and calm at this time.

## 2022-12-19 ENCOUNTER — Emergency Department (HOSPITAL_COMMUNITY)
Admission: EM | Admit: 2022-12-19 | Discharge: 2022-12-20 | Disposition: A | Discharge status: Other Acute Inpatient Hospital | Payer: Medicaid Other | Attending: Emergency Medicine | Admitting: Emergency Medicine

## 2022-12-19 ENCOUNTER — Other Ambulatory Visit: Payer: Self-pay

## 2022-12-19 ENCOUNTER — Encounter (HOSPITAL_COMMUNITY): Payer: Self-pay | Admitting: *Deleted

## 2022-12-19 DIAGNOSIS — F332 Major depressive disorder, recurrent severe without psychotic features: Secondary | ICD-10-CM | POA: Insufficient documentation

## 2022-12-19 DIAGNOSIS — R45851 Suicidal ideations: Secondary | ICD-10-CM

## 2022-12-19 LAB — CBC WITH DIFFERENTIAL/PLATELET
Abs Immature Granulocytes: 0.01 10*3/uL (ref 0.00–0.07)
Basophils Absolute: 0.1 10*3/uL (ref 0.0–0.1)
Basophils Relative: 1 %
Eosinophils Absolute: 0.2 10*3/uL (ref 0.0–0.5)
Eosinophils Relative: 3 %
HCT: 38.8 % (ref 36.0–46.0)
Hemoglobin: 12.3 g/dL (ref 12.0–15.0)
Immature Granulocytes: 0 %
Lymphocytes Relative: 44 %
Lymphs Abs: 2.9 10*3/uL (ref 0.7–4.0)
MCH: 28.5 pg (ref 26.0–34.0)
MCHC: 31.7 g/dL (ref 30.0–36.0)
MCV: 90 fL (ref 80.0–100.0)
Monocytes Absolute: 0.5 10*3/uL (ref 0.1–1.0)
Monocytes Relative: 7 %
Neutro Abs: 2.9 10*3/uL (ref 1.7–7.7)
Neutrophils Relative %: 45 %
Platelets: 218 10*3/uL (ref 150–400)
RBC: 4.31 MIL/uL (ref 3.87–5.11)
RDW: 12.8 % (ref 11.5–15.5)
WBC: 6.5 10*3/uL (ref 4.0–10.5)
nRBC: 0 % (ref 0.0–0.2)

## 2022-12-19 LAB — BASIC METABOLIC PANEL
Anion gap: 10 (ref 5–15)
BUN: <5 mg/dL — ABNORMAL LOW (ref 6–20)
CO2: 23 mmol/L (ref 22–32)
Calcium: 9.2 mg/dL (ref 8.9–10.3)
Chloride: 105 mmol/L (ref 98–111)
Creatinine, Ser: 0.75 mg/dL (ref 0.44–1.00)
GFR, Estimated: >60 mL/min (ref >60)
Glucose, Bld: 80 mg/dL (ref 70–99)
Potassium: 3.5 mmol/L (ref 3.5–5.1)
Sodium: 138 mmol/L (ref 135–145)

## 2022-12-19 LAB — I-STAT BETA HCG BLOOD, ED (MC, WL, AP ONLY): I-stat hCG, quantitative: <5 m[IU]/mL (ref <5)

## 2022-12-19 LAB — ETHANOL: Alcohol, Ethyl (B): <10 mg/dL (ref <10)

## 2022-12-19 MED ORDER — ACETAMINOPHEN 325 MG PO TABS: 650.0000 mg | ORAL_TABLET | ORAL | Status: DC | PRN

## 2022-12-19 NOTE — ED Triage Notes (Signed)
The pt thought about styabbing herself last pm and when she was in a psychiatric facility and discharged she was given prescriptions that she did not get filled she denies drugs and alcohol  lmp none  she reports that she no longer has a period

## 2022-12-19 NOTE — ED Notes (Signed)
Pt. Wanded by security 

## 2022-12-19 NOTE — ED Notes (Signed)
TTS in process 

## 2022-12-19 NOTE — ED Notes (Signed)
IVC paperwork completed and given to the purple zone

## 2022-12-19 NOTE — ED Triage Notes (Signed)
The pt reports that she has been feeling suicidal for awhile she has not been seeing anyone about it and has no plan to carry it out

## 2022-12-19 NOTE — ED Provider Notes (Signed)
[place] EMERGENCY DEPARTMENT AT [facility] Provider Note   CSN: [id number] Arrival date & time: 12/19/22  1610     History  Chief Complaint  Patient presents with   Suicidal    Yolanda [name] is a 47 y.o. female.  Patient is a 48 year old female who presents with suicidal ideations.  She has a history of schizoaffective disorder.  She states she is not taking her medications.  She has had depression for a while but says last night she actually had thoughts of wanting to kill herself and had a plan of stabbing herself.  She denies any recent illnesses or physical complaints.  She denies any alcohol or drug use.       Home Medications Prior to Admission medications   Medication Sig Start Date End Date Taking? Authorizing Provider  busPIRone (BUSPAR) 15 MG tablet Take 15 mg by mouth 2 (two) times daily. Patient not taking: Reported on 12/19/2022    [provider]  paliperidone (INVEGA SUSTENNA) 156 MG/ML SUSY injection Inject 156 mg into the muscle once. Patient not taking: Reported on 12/19/2022    [provider]  sertraline (ZOLOFT) 50 MG tablet Take 150 mg by mouth daily. Patient not taking: Reported on 12/19/2022 09/12/22 12/19/22  [provider]  traZODone (DESYREL) 100 MG tablet Take 100 mg by mouth at bedtime as needed for sleep. Patient not taking: Reported on 11/12/2022    [provider]      Allergies    Patient has no known allergies.    Review of Systems   Review of Systems  Constitutional:  Negative for chills, diaphoresis, fatigue and fever.  HENT:  Negative for congestion, rhinorrhea and sneezing.   Eyes: Negative.   Respiratory:  Negative for cough, chest tightness and shortness of breath.   Cardiovascular:  Negative for chest pain and leg swelling.  Gastrointestinal:  Negative for abdominal pain, blood in stool, diarrhea, nausea and vomiting.  Genitourinary:  Negative for difficulty urinating, flank pain,  frequency and hematuria.  Musculoskeletal:  Negative for arthralgias and back pain.  Skin:  Negative for rash.  Neurological:  Negative for dizziness, speech difficulty, weakness, numbness and headaches.  Psychiatric/Behavioral:  Positive for suicidal ideas.     Physical Exam Updated Vital Signs BP 127/88 (BP Location: Right Arm)   Pulse 72   Temp 98.5 F (36.9 C)   Resp 19   Ht 4\' 11"  (1.499 m)   Wt 65 kg   SpO2 98%   BMI 28.94 kg/m  Physical Exam Constitutional:      Appearance: She is well-developed.  HENT:     Head: Normocephalic and atraumatic.  Eyes:     Pupils: Pupils are equal, round, and reactive to light.  Cardiovascular:     Rate and Rhythm: Normal rate and regular rhythm.     Heart sounds: Normal heart sounds.  Pulmonary:     Effort: Pulmonary effort is normal. No respiratory distress.     Breath sounds: Normal breath sounds. No wheezing or rales.  Chest:     Chest wall: No tenderness.  Abdominal:     General: Bowel sounds are normal.     Palpations: Abdomen is soft.     Tenderness: There is no abdominal tenderness. There is no guarding or rebound.  Musculoskeletal:        General: Normal range of motion.     Cervical back: Normal range of motion and neck supple.  Lymphadenopathy:  Cervical: No cervical adenopathy.  Skin:    General: Skin is warm and dry.     Findings: No rash.  Neurological:     Mental Status: She is alert and oriented to person, place, and time.     Comments: Crying on exam     ED Results / Procedures / Treatments   Labs (all labs ordered are listed, but only abnormal results are displayed) Labs Reviewed  BASIC METABOLIC PANEL - Abnormal; Notable for the following components:      Result Value   BUN <5 (*)    All other components within normal limits  ETHANOL  CBC WITH DIFFERENTIAL/PLATELET  RAPID URINE DRUG SCREEN, HOSP PERFORMED  I-STAT BETA HCG BLOOD, ED ([facility], [facility], [facility] ONLY)  I-STAT BETA HCG BLOOD, ED ([facility], [facility], [facility] ONLY)     EKG EKG Interpretation  Date/Time:  Wednesday December 19 2022 19:07:40 EDT Ventricular Rate:  73 PR Interval:  114 QRS Duration: 68 QT Interval:  370 QTC Calculation: 407 R Axis:   22 Text Interpretation: Normal sinus rhythm T wave abnormality, consider inferior ischemia T wave abnormality, consider anterolateral ischemia Abnormal ECG When compared with ECG of 10-Nov-2022 19:23, PREVIOUS ECG IS PRESENT Confirmed by Rolan Bucco [phone]) on 12/19/2022 8:45:56 PM  Radiology No results found.  Procedures Procedures    Medications Ordered in ED Medications  acetaminophen (TYLENOL) tablet 650 mg (has no administration in time range)    ED Course/ Medical Decision Making/ A&P                             Medical Decision Making Amount and/or Complexity of Data Reviewed Labs: ordered.  Risk OTC drugs.   Patient is a 47 year old who presents with suicidal ideations.  She has a plan of stabbing herself.  IVC papers were initiated.  Labs were obtained and reviewed.  These are nonconcerning.  She is medically cleared and awaiting TTS evaluation.  Psych: Orders were placed.  She is not currently taking any medications so we will wait for psychiatric recommendations.  Final Clinical Impression(s) / ED Diagnoses Final diagnoses:  Suicidal ideation    Rx / DC Orders ED Discharge Orders     None         Rolan Bucco, MD 12/19/22 2118

## 2022-12-19 NOTE — ED Notes (Signed)
Pt belongings accounted for in locker 1 in purple zone. Tablet checked w/ security w/ pt sticker attached to it.
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
## 2022-12-20 ENCOUNTER — Inpatient Hospital Stay (HOSPITAL_COMMUNITY)
Admission: AD | Admit: 2022-12-20 | Discharge: 2023-08-09 | DRG: 885 | Disposition: A | Discharge status: Home / Self Care | Payer: MEDICAID | Source: Intra-hospital | Attending: Psychiatry | Admitting: Psychiatry

## 2022-12-20 ENCOUNTER — Encounter (HOSPITAL_COMMUNITY): Payer: Self-pay | Admitting: Psychiatry

## 2022-12-20 DIAGNOSIS — Z6828 Body mass index (BMI) 28.0-28.9, adult: Secondary | ICD-10-CM

## 2022-12-20 DIAGNOSIS — F064 Anxiety disorder due to known physiological condition: Secondary | ICD-10-CM | POA: Diagnosis not present

## 2022-12-20 DIAGNOSIS — R197 Diarrhea, unspecified: Secondary | ICD-10-CM | POA: Diagnosis not present

## 2022-12-20 DIAGNOSIS — E7800 Pure hypercholesterolemia, unspecified: Secondary | ICD-10-CM | POA: Diagnosis present

## 2022-12-20 DIAGNOSIS — R110 Nausea: Secondary | ICD-10-CM | POA: Diagnosis not present

## 2022-12-20 DIAGNOSIS — Z9151 Personal history of suicidal behavior: Secondary | ICD-10-CM

## 2022-12-20 DIAGNOSIS — R451 Restlessness and agitation: Secondary | ICD-10-CM | POA: Diagnosis present

## 2022-12-20 DIAGNOSIS — F17203 Nicotine dependence unspecified, with withdrawal: Secondary | ICD-10-CM | POA: Diagnosis present

## 2022-12-20 DIAGNOSIS — E039 Hypothyroidism, unspecified: Secondary | ICD-10-CM | POA: Diagnosis present

## 2022-12-20 DIAGNOSIS — E669 Obesity, unspecified: Secondary | ICD-10-CM | POA: Diagnosis present

## 2022-12-20 DIAGNOSIS — K5900 Constipation, unspecified: Secondary | ICD-10-CM | POA: Diagnosis present

## 2022-12-20 DIAGNOSIS — Z79899 Other long term (current) drug therapy: Secondary | ICD-10-CM

## 2022-12-20 DIAGNOSIS — G43909 Migraine, unspecified, not intractable, without status migrainosus: Secondary | ICD-10-CM | POA: Diagnosis not present

## 2022-12-20 DIAGNOSIS — B850 Pediculosis due to Pediculus humanus capitis: Secondary | ICD-10-CM | POA: Diagnosis not present

## 2022-12-20 DIAGNOSIS — N390 Urinary tract infection, site not specified: Secondary | ICD-10-CM | POA: Diagnosis not present

## 2022-12-20 DIAGNOSIS — R45851 Suicidal ideations: Secondary | ICD-10-CM | POA: Diagnosis present

## 2022-12-20 DIAGNOSIS — F17200 Nicotine dependence, unspecified, uncomplicated: Secondary | ICD-10-CM | POA: Diagnosis present

## 2022-12-20 DIAGNOSIS — E781 Pure hyperglyceridemia: Secondary | ICD-10-CM | POA: Diagnosis present

## 2022-12-20 DIAGNOSIS — F251 Schizoaffective disorder, depressive type: Principal | ICD-10-CM | POA: Diagnosis present

## 2022-12-20 DIAGNOSIS — G4700 Insomnia, unspecified: Secondary | ICD-10-CM | POA: Diagnosis present

## 2022-12-20 DIAGNOSIS — F329 Major depressive disorder, single episode, unspecified: Secondary | ICD-10-CM | POA: Diagnosis present

## 2022-12-20 DIAGNOSIS — F250 Schizoaffective disorder, bipolar type: Principal | ICD-10-CM | POA: Diagnosis present

## 2022-12-20 DIAGNOSIS — M25572 Pain in left ankle and joints of left foot: Secondary | ICD-10-CM | POA: Diagnosis not present

## 2022-12-20 DIAGNOSIS — Z79890 Hormone replacement therapy (postmenopausal): Secondary | ICD-10-CM

## 2022-12-20 DIAGNOSIS — N3944 Nocturnal enuresis: Secondary | ICD-10-CM | POA: Diagnosis present

## 2022-12-20 DIAGNOSIS — F1411 Cocaine abuse, in remission: Secondary | ICD-10-CM | POA: Diagnosis present

## 2022-12-20 DIAGNOSIS — F79 Unspecified intellectual disabilities: Secondary | ICD-10-CM | POA: Diagnosis present

## 2022-12-20 DIAGNOSIS — Z602 Problems related to living alone: Secondary | ICD-10-CM | POA: Diagnosis present

## 2022-12-20 DIAGNOSIS — E559 Vitamin D deficiency, unspecified: Secondary | ICD-10-CM | POA: Diagnosis present

## 2022-12-20 DIAGNOSIS — E538 Deficiency of other specified B group vitamins: Secondary | ICD-10-CM | POA: Diagnosis present

## 2022-12-20 DIAGNOSIS — Z5900 Homelessness unspecified: Secondary | ICD-10-CM | POA: Diagnosis not present

## 2022-12-20 DIAGNOSIS — Z751 Person awaiting admission to adequate facility elsewhere: Secondary | ICD-10-CM

## 2022-12-20 DIAGNOSIS — I959 Hypotension, unspecified: Secondary | ICD-10-CM | POA: Diagnosis not present

## 2022-12-20 DIAGNOSIS — F259 Schizoaffective disorder, unspecified: Principal | ICD-10-CM | POA: Diagnosis present

## 2022-12-20 DIAGNOSIS — Z555 Less than a high school diploma: Secondary | ICD-10-CM

## 2022-12-20 DIAGNOSIS — K219 Gastro-esophageal reflux disease without esophagitis: Secondary | ICD-10-CM | POA: Diagnosis present

## 2022-12-20 DIAGNOSIS — Z91148 Patient's other noncompliance with medication regimen for other reason: Secondary | ICD-10-CM

## 2022-12-20 DIAGNOSIS — F71 Moderate intellectual disabilities: Secondary | ICD-10-CM | POA: Diagnosis present

## 2022-12-20 HISTORY — DX: Unspecified intellectual disabilities: F79

## 2022-12-20 LAB — GLUCOSE, CAPILLARY: Glucose-Capillary: 122 mg/dL — ABNORMAL HIGH (ref 70–99)

## 2022-12-20 MED ORDER — ACETAMINOPHEN 325 MG PO TABS
650.0000 mg | ORAL_TABLET | Freq: Four times a day (QID) | ORAL | Status: DC | PRN
  Administered 2022-12-29 – 2023-08-06 (×88): 650 mg via ORAL
  Filled 2022-12-20 (×91): qty 2

## 2022-12-20 MED ORDER — BUSPIRONE HCL 15 MG PO TABS
15.0000 mg | ORAL_TABLET | Freq: Two times a day (BID) | ORAL | Status: DC
  Administered 2022-12-20 – 2023-08-09 (×461): 15 mg via ORAL
  Filled 2022-12-20 (×78): qty 1
  Filled 2022-12-20: qty 3
  Filled 2022-12-20 (×7): qty 1
  Filled 2022-12-20: qty 3
  Filled 2022-12-20 (×36): qty 1
  Filled 2022-12-20: qty 3
  Filled 2022-12-20 (×17): qty 1
  Filled 2022-12-20: qty 3
  Filled 2022-12-20 (×22): qty 1
  Filled 2022-12-20: qty 3
  Filled 2022-12-20 (×10): qty 1
  Filled 2022-12-20: qty 3
  Filled 2022-12-20 (×31): qty 1
  Filled 2022-12-20: qty 3
  Filled 2022-12-20 (×130): qty 1
  Filled 2022-12-20: qty 3
  Filled 2022-12-20 (×43): qty 1
  Filled 2022-12-20: qty 3
  Filled 2022-12-20: qty 14
  Filled 2022-12-20 (×6): qty 1
  Filled 2022-12-20 (×2): qty 3
  Filled 2022-12-20 (×60): qty 1
  Filled 2022-12-20: qty 14
  Filled 2022-12-20 (×26): qty 1

## 2022-12-20 MED ORDER — DIPHENHYDRAMINE HCL 25 MG PO CAPS
50.0000 mg | ORAL_CAPSULE | Freq: Three times a day (TID) | ORAL | Status: DC | PRN
  Administered 2023-01-15 – 2023-06-03 (×2): 50 mg via ORAL
  Filled 2022-12-20 (×2): qty 2

## 2022-12-20 MED ORDER — HALOPERIDOL LACTATE 5 MG/ML IJ SOLN: 5.0000 mg | Freq: Three times a day (TID) | INTRAMUSCULAR | Status: DC | PRN

## 2022-12-20 MED ORDER — HALOPERIDOL 5 MG PO TABS
5.0000 mg | ORAL_TABLET | Freq: Three times a day (TID) | ORAL | Status: DC | PRN
  Administered 2023-01-13 – 2023-06-03 (×5): 5 mg via ORAL
  Filled 2022-12-20 (×6): qty 1

## 2022-12-20 MED ORDER — PALIPERIDONE ER 3 MG PO TB24
3.0000 mg | ORAL_TABLET | Freq: Every day | ORAL | Status: DC
  Administered 2022-12-20 – 2023-01-15 (×26): 3 mg via ORAL
  Filled 2022-12-20 (×30): qty 1

## 2022-12-20 MED ORDER — LORAZEPAM 1 MG PO TABS
2.0000 mg | ORAL_TABLET | Freq: Three times a day (TID) | ORAL | Status: DC | PRN
  Administered 2023-01-15 – 2023-06-03 (×2): 2 mg via ORAL
  Filled 2022-12-20 (×2): qty 2

## 2022-12-20 MED ORDER — PANTOPRAZOLE SODIUM 40 MG PO TBEC
40.0000 mg | DELAYED_RELEASE_TABLET | Freq: Every day | ORAL | Status: DC
  Administered 2022-12-21 – 2023-08-09 (×229): 40 mg via ORAL
  Filled 2022-12-20 (×104): qty 1
  Filled 2022-12-20: qty 7
  Filled 2022-12-20 (×135): qty 1

## 2022-12-20 MED ORDER — LORAZEPAM 2 MG/ML IJ SOLN: 2.0000 mg | Freq: Three times a day (TID) | INTRAMUSCULAR | Status: DC | PRN

## 2022-12-20 MED ORDER — TRAZODONE HCL 100 MG PO TABS
100.0000 mg | ORAL_TABLET | Freq: Every evening | ORAL | Status: DC | PRN
  Administered 2022-12-29 – 2022-12-31 (×3): 100 mg via ORAL
  Filled 2022-12-20 (×4): qty 1

## 2022-12-20 MED ORDER — ALUM & MAG HYDROXIDE-SIMETH 200-200-20 MG/5ML PO SUSP
30.0000 mL | ORAL | Status: DC | PRN
  Administered 2023-01-28: 30 mL via ORAL
  Filled 2022-12-20: qty 30

## 2022-12-20 MED ORDER — DIPHENHYDRAMINE HCL 50 MG/ML IJ SOLN: 50.0000 mg | Freq: Three times a day (TID) | INTRAMUSCULAR | Status: DC | PRN

## 2022-12-20 MED ORDER — SERTRALINE HCL 50 MG PO TABS
150.0000 mg | ORAL_TABLET | Freq: Every day | ORAL | Status: DC
  Administered 2022-12-20 – 2022-12-21 (×2): 150 mg via ORAL
  Filled 2022-12-20 (×4): qty 1

## 2022-12-20 MED ORDER — MAGNESIUM HYDROXIDE 400 MG/5ML PO SUSP
30.0000 mL | Freq: Every day | ORAL | Status: DC | PRN
  Administered 2023-02-12 – 2023-07-18 (×10): 30 mL via ORAL
  Filled 2022-12-20 (×10): qty 30

## 2022-12-20 MED ORDER — NICOTINE 14 MG/24HR TD PT24
14.0000 mg | MEDICATED_PATCH | Freq: Every day | TRANSDERMAL | Status: DC
  Administered 2022-12-27 – 2022-12-28 (×2): 14 mg via TRANSDERMAL
  Filled 2022-12-20 (×11): qty 1
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
## 2022-12-20 NOTE — H&P (Signed)
Psychiatric Admission Assessment Adult  Patient Identification: Yolanda [name]  MRN:  [id number]  Date of Evaluation:  12/20/2022  Chief Complaint:  MDD (major depressive disorder) [F32.9]  Principal Diagnosis: Schizoaffective disorder (HCC)  Diagnosis:  Principal Problem:   Schizoaffective disorder (HCC) Active Problems:   MDD (major depressive disorder)  History of Present Illness: This is the first psychiatric admission in this [facility] in 12 years for this AA female with an extensive hx of mental illnesses & probable polysubstance use disorders. She is admitted to the [facility] from the [facility] hospital with complain of worsening suicidal ideations with plan to stab herself. Per chart review, patient apparently reported at the ED that she has been depressed for a while & has not been taking her mental health medications. After medical evaluation.clearance, she was transferred to the [facility] for further psychiatric evaluation/treatments. During this evaluation, Yolanda [name] presents irritated, labile, angry & tearful. She is not forth-coming with the information needed to help manage her care. She reports,   "A friend dropped me at hospital the other day. I don't know the name of the hospital. I can't remember nothing. All I can tell is, I don't know how to be positive, so I wanted to end my life. I don't know why I feel this way. I have been on medication most of my life for my mental health & I don't know why. I don't know when I took my last medicine. I don't know nothing, damn it. I don't remember nothing".  Objective: Yolanda [name] presents alert, oriented to her name. She is saying she does not remember much about her hx. She says she does not remember where she was receiving mental health care on an outpatient basis. She presents disheveled, agitated with angry out-bursts & crying spells. She is uncooperative during this evaluation. The Care everywhere has shown were she had been admitted/treated at the  [facility] more than once & also [facility]. She has also been treated at the [facility] health care. Chart review has shown that patient has been on several psychotropic medications including monthly invega sustenna injections.  Associated Signs/Symptoms:  Depression Symptoms:  depressed mood, insomnia, psychomotor agitation, suicidal thoughts with specific plan, anxiety,  (Hypo) Manic Symptoms:  Irritable Mood, Labiality of Mood,  Anxiety Symptoms:  Excessive Worry, Agitation.  Psychotic Symptoms:   Patient is not forth coming with the answers to the assessment questions. She is refusing to provide information about her mental health hx or symptoms.  PTSD Symptoms: NA  Total Time spent with patient: 1 hour  Past Psychiatric History: Per chart review  Is the patient at risk to self? Unsure, patient refused to provide this information.  Has the patient been a risk to self in the past 6 months? Yes.    Has the patient been a risk to self within the distant past? Yes.    Is the patient a risk to others? No.  Has the patient been a risk to others in the past 6 months? No.  Has the patient been a risk to others within the distant past? No.   [place] Scale:  Flowsheet Row [facility] ([facility]) [facility] 12/19/2022 in [facility] Emergency Department at [facility] ED from 11/12/2022 in [facility] Emergency Department at [facility]  C-SSRS RISK CATEGORY High Risk High Risk Moderate Risk      Prior Inpatient Therapy: Yes.   If yes, describe: [facility], [facility].  Prior Outpatient Therapy: Yes.   If yes, describe: Patient refused to provide this information.   Alcohol Screening: Patient refused Alcohol Screening Tool: Yes 1. How often do you have a drink containing alcohol?: Never 2. How many drinks containing alcohol do you have on a typical day when you are drinking?: 1 or 2 3. How often do you  have six or more drinks on one occasion?: Never AUDIT-C Score: 0 4. How often during the last year have you found that you were not able to stop drinking once you had started?: Never 5. How often during the last year have you failed to do what was normally expected from you because of drinking?: Never 6. How often during the last year have you needed a first drink in the morning to get yourself going after a heavy drinking session?: Never 7. How often during the last year have you had a feeling of guilt of remorse after drinking?: Never 8. How often during the last year have you been unable to remember what happened the night before because you had been drinking?: Never 9. Have you or someone else been injured as a result of your drinking?: No 10. Has a relative or friend or a doctor or another health worker been concerned about your drinking or suggested you cut down?: No Alcohol Use Disorder Identification Test Final Score (AUDIT): 0  Substance Abuse History in the last 12 months:  Yes.    Consequences of Substance Abuse: Discussed with patient during this admission evaluation. Medical Consequences:  Liver damage, Possible death by overdose Legal Consequences:  Arrests, jail time, Loss of driving privilege. Family Consequences:  Family discord, divorce and or separation.  Previous Psychotropic Medications:  Yes, Mirtazapine, Invega sustenna, Gabapentin, Cogentin, Buspar, Naltrexone, Abilify.  Psychological Evaluations: No   Past Medical History:  Past Medical History:  Diagnosis Date   Bipolar affect, depressed (HCC)    Depression     Past Surgical History:  Procedure Laterality Date   NO PAST SURGERIES     SALPINGECTOMY     Family History: History reviewed. No pertinent family history.  Family Psychiatric  History: Patient reports that everyone in her family has bipolar disorder.   Tobacco Screening:  Social History   Tobacco Use  Smoking Status Every Day  Smokeless  Tobacco Not on file    BH Tobacco Counseling     Are you interested in Tobacco Cessation Medications?  Yes, implement Nicotene Replacement Protocol Counseled patient on smoking cessation:  Yes Reason Tobacco Screening Not Completed: Patient Refused Screening       Social History:  Social History   Substance and Sexual Activity  Alcohol Use Yes     Social History   Substance and Sexual Activity  Drug Use Yes   Types: Cocaine, Marijuana    Additional Social History:  Allergies:  No Known Allergies Lab Results:  Results for orders placed or performed during the hospital encounter of 12/19/22 (from the past 48 hour(s))  Ethanol     Status: None   Collection Time: 12/19/22  6:58 PM  Result Value Ref Range   Alcohol, Ethyl (B) <10 <10 mg/dL    Comment: (NOTE) Lowest detectable limit for serum alcohol is 10 mg/dL.  For medical purposes only. Performed at [facility], [address] [place], [place] [address]   CBC with Diff     Status: None   Collection Time: 12/19/22  6:58 PM  Result Value Ref Range  WBC 6.5 4.0 - 10.5 K/uL   RBC 4.31 3.87 - 5.11 MIL/uL   Hemoglobin 12.3 12.0 - 15.0 g/dL   HCT 96.2 95.2 - 84.1 %   MCV 90.0 80.0 - 100.0 fL   MCH 28.5 26.0 - 34.0 pg   MCHC 31.7 30.0 - 36.0 g/dL   RDW 32.4 40.1 - 02.7 %   Platelets 218 150 - 400 K/uL   nRBC 0.0 0.0 - 0.2 %   Neutrophils Relative % 45 %   Neutro Abs 2.9 1.7 - 7.7 K/uL   Lymphocytes Relative 44 %   Lymphs Abs 2.9 0.7 - 4.0 K/uL   Monocytes Relative 7 %   Monocytes Absolute 0.5 0.1 - 1.0 K/uL   Eosinophils Relative 3 %   Eosinophils Absolute 0.2 0.0 - 0.5 K/uL   Basophils Relative 1 %   Basophils Absolute 0.1 0.0 - 0.1 K/uL   Immature Granulocytes 0 %   Abs Immature Granulocytes 0.01 0.00 - 0.07 K/uL    Comment: Performed at [facility], [address] [place], [place] [address]  Basic metabolic panel     Status: Abnormal   Collection Time: 12/19/22  6:58 PM  Result Value Ref  Range   Sodium 138 135 - 145 mmol/L   Potassium 3.5 3.5 - 5.1 mmol/L   Chloride 105 98 - 111 mmol/L   CO2 23 22 - 32 mmol/L   Glucose, Bld 80 70 - 99 mg/dL    Comment: Glucose reference range applies only to samples taken after fasting for at least 8 hours.   BUN <5 (L) 6 - 20 mg/dL   Creatinine, Ser 4.40 0.44 - 1.00 mg/dL   Calcium 9.2 8.9 - 34.7 mg/dL   GFR, Estimated >42 >59 mL/min    Comment: (NOTE) Calculated using the CKD-EPI Creatinine Equation (2021)    Anion gap 10 5 - 15    Comment: Performed at [facility], [address], [place], [place] [address]  I-Stat beta hCG blood, ED     Status: None   Collection Time: 12/19/22  8:58 PM  Result Value Ref Range   I-stat hCG, quantitative <5.0 <5 mIU/mL   Comment 3            Comment:   GEST. AGE      CONC.  (mIU/mL)   <=1 WEEK        5 - 50     2 WEEKS       50 - 500     3 WEEKS       100 - 10,000     4 WEEKS     1,000 - 30,000        FEMALE AND NON-PREGNANT FEMALE:     LESS THAN 5 mIU/mL    Blood Alcohol level:  Lab Results  Component Value Date   ETH <10 12/19/2022   ETH <10 11/21/2019   Metabolic Disorder Labs:  No results found for: "HGBA1C", "MPG" No results found for: "PROLACTIN" Lab Results  Component Value Date   CHOL 199 03/13/2011   TRIG 82 03/13/2011   HDL 73 03/13/2011   CHOLHDL 2.7 03/13/2011   VLDL 16 03/13/2011   LDLCALC 110 (H) 03/13/2011   Current Medications: Current Facility-Administered Medications  Medication Dose Route Frequency Provider Last Rate Last Admin   acetaminophen (TYLENOL) tablet 650 mg  650 mg Oral Q6H PRN [name], NP       alum & mag hydroxide-simeth (MAALOX/MYLANTA)  200-200-20 MG/5ML suspension 30 mL  30 mL Oral Q4H PRN [name], NP       busPIRone (BUSPAR) tablet 15 mg  15 mg Oral BID [name], NP       diphenhydrAMINE (BENADRYL) capsule 50 mg  50 mg Oral TID PRN [name], NP       Or   diphenhydrAMINE (BENADRYL) injection 50 mg  50 mg  Intramuscular TID PRN [name], NP       LORazepam (ATIVAN) tablet 2 mg  2 mg Oral TID PRN [name], NP       Or   LORazepam (ATIVAN) injection 2 mg  2 mg Intramuscular TID PRN [name], NP       magnesium hydroxide (MILK OF MAGNESIA) suspension 30 mL  30 mL Oral Daily PRN [name], NP       nicotine (NICODERM CQ - dosed in mg/24 hours) patch 14 mg  14 mg Transdermal Daily [name], NP       sertraline (ZOLOFT) tablet 150 mg  150 mg Oral Daily Yeraldi Fidler I, NP       traZODone (DESYREL) tablet 100 mg  100 mg Oral QHS PRN [name], NP       PTA Medications: Medications Prior to Admission  Medication Sig Dispense Refill Last Dose   busPIRone (BUSPAR) 15 MG tablet Take 15 mg by mouth 2 (two) times daily. (Patient not taking: Reported on 12/19/2022)      paliperidone (INVEGA SUSTENNA) 156 MG/ML SUSY injection Inject 156 mg into the muscle once. (Patient not taking: Reported on 12/19/2022)      sertraline (ZOLOFT) 50 MG tablet Take 150 mg by mouth daily. (Patient not taking: Reported on 12/19/2022)      traZODone (DESYREL) 100 MG tablet Take 100 mg by mouth at bedtime as needed for sleep. (Patient not taking: Reported on 11/12/2022)      Musculoskeletal: Strength & Muscle Tone: within normal limits Gait & Station: normal Patient leans: N/A  Psychiatric Specialty Exam:  Presentation  General Appearance:  Disheveled  Eye Contact: Fair  Speech: Clear and Coherent  Speech Volume: Increased  Handedness: Right   Mood and Affect  Mood: Anxious; Depressed; Angry; Irritable; Labile  Affect: Congruent; Tearful; Labile   Thought Process  Thought Processes: Coherent  Duration of Psychotic Symptoms: Greater than two weeks. Past Diagnosis of Schizophrenia or Psychoactive disorder: Yes  Descriptions of Associations:Tangential  Orientation:Full (Time, Place and Person)  Thought Content:Tangential  Hallucinations:Hallucinations: None  Ideas of  Reference:None  Suicidal Thoughts:Suicidal Thoughts: No  Homicidal Thoughts:Homicidal Thoughts: No   Sensorium  Memory: Immediate Poor; Recent Poor; Remote Poor  Judgment: Fair  Insight: Lacking   Executive Functions  Concentration: Poor  Attention Span: Poor  Recall: Poor  Fund of Knowledge: Poor  Language: Fair   Psychomotor Activity  Psychomotor Activity:Psychomotor Activity: Increased   Assets  Assets: Desire for Improvement; Resilience  Sleep  Sleep:Sleep: Good Number of Hours of Sleep: 7.5  Physical Exam: Physical Exam Vitals and nursing note reviewed.  HENT:     Head: Normocephalic.     Nose: Nose normal.  Cardiovascular:     Rate and Rhythm: Normal rate.     Pulses: Normal pulses.  Pulmonary:     Effort: Pulmonary effort is normal.  Genitourinary:    Comments: Deferred Musculoskeletal:        General: Normal range of motion.     Cervical back: Normal range of motion.  Skin:  General: Skin is warm and dry.  Neurological:     General: No focal deficit present.     Mental Status: She is alert.    Review of Systems  Constitutional:  Negative for chills and fever.  HENT:  Negative for sore throat.   Respiratory:  Negative for cough, shortness of breath and wheezing.   Cardiovascular:  Negative for chest pain and palpitations.   Blood pressure 104/77, pulse 72, temperature 98.5 F (36.9 C), temperature source Oral, resp. rate 18, height 4\' 11"  (1.499 m), weight 63 kg, SpO2 99 %. Body mass index is 28.05 kg/m.  Treatment Plan Summary: Daily contact with patient to assess and evaluate symptoms and progress in treatment and Medication management.   Principal/active diagnoses:  Schizoaffective disorder, bipolar type.   Associated symptoms.  Angry outburst. Suicidal ideations.  Crying spell.  Irritability.  Plan: Re-initiated:  -Buspar 15 mg po bid for anxiety.  -paliperidone 3 mg po daily for mood control.  -Sertraline 50  mg po Q daily for depression. -Trazodone 100 mg po Q bedtime prn for insomnia.  -Continue Nicoderm topically Q 24 hrs for nicotine withdrawal management.  Agitation protocols: Cont as recommended;  -Benadryl 50 mg po or IM tid prn. -Haldol 5 mg po or IM tid prn.  -Lorazepam 2 mg po or IM tid prn.  Other PRNS -Continue Tylenol 650 mg every 6 hours PRN for mild pain -Continue Maalox 30 ml Q 4 hrs PRN for indigestion -Continue MOM 30 ml po Q 6 hrs for constipation  Safety and Monitoring: Voluntary admission to inpatient psychiatric unit for safety, stabilization and treatment Daily contact with patient to assess and evaluate symptoms and progress in treatment Patient's case to be discussed in multi-disciplinary team meeting Observation Level : q15 minute checks Vital signs: q12 hours Precautions: Safety  Discharge Planning: Social work and case management to assist with discharge planning and identification of hospital follow-up needs prior to discharge Estimated LOS: 5-7 days Discharge Concerns: Need to establish a safety plan; Medication compliance and effectiveness Discharge Goals: Return home with outpatient referrals for mental health follow-up including medication management/psychotherapy  Observation Level/Precautions:  15 minute checks  Laboratory:   Per Ed, current lab results reviewed , will obtain lipid panel, TSH, lipid panel, hgba1c.  Psychotherapy: Enrolled in the group sessions.  Medications: See MAR.   Consultations: As needed.    Discharge Concerns: Safety, mood stabilization.   Estimated LOS: 3-5 days.  Other: NA   Physician Treatment Plan for Primary Diagnosis: Schizoaffective disorder (HCC) Long Term Goal(s): Improvement in symptoms so as ready for discharge  Short Term Goals: Ability to identify changes in lifestyle to reduce recurrence of condition will improve, Ability to verbalize feelings will improve, Ability to disclose and discuss suicidal ideas, and  Ability to demonstrate self-control will improve  Physician Treatment Plan for Secondary Diagnosis: Principal Problem:   Schizoaffective disorder (HCC) Active Problems:   MDD (major depressive disorder)  Long Term Goal(s): Improvement in symptoms so as ready for discharge  Short Term Goals: Ability to identify and develop effective coping behaviors will improve, Ability to maintain clinical measurements within normal limits will improve, Compliance with prescribed medications will improve, and Ability to identify triggers associated with substance abuse/mental health issues will improve  I certify that inpatient services furnished can reasonably be expected to improve the patient's condition.    Armandina Stammer, NP, pmhnp. Fnp-bc. 6/13/202412:32 PM

## 2022-12-20 NOTE — Progress Notes (Addendum)
Patient presents depressed- forwards little during interactions- is soft spoken to where it is difficult to understand her at times. For much of the shift, pt has been isolating to her room- laying in bed - sometimes with a bible open beside her. Pt did come out of her room around lunch time, and was visible in the cubby space in the hallway eating a salad. Pt remarked that she was trying to stay positive, but was having a hard time doing that in her room. Pt denied SI/HI and A/VH, and does not appear to be responding to internal stimuli. A. Labs and vitals monitored. Pt given and educated on medications. Pt supported emotionally and encouraged to express concerns and ask questions.   R. Pt remains safe with 15 minute checks. Will continue POC.    12/20/22 1700  Psych Admission Type (Psych Patients Only)  Admission Status Involuntary  Psychosocial Assessment  Patient Complaints Depression;Worrying  Eye Contact Fair  Facial Expression Sad  Affect Depressed  Speech Soft  Interaction Minimal  Motor Activity Slow  Appearance/Hygiene Unremarkable  Behavior Characteristics Cooperative;Calm  Mood Depressed;Pleasant  Thought Process  Coherency WDL  Content WDL  Delusions None reported or observed  Perception WDL  Hallucination None reported or observed  Judgment Limited  Confusion None  Danger to Self  Current suicidal ideation? Passive  Self-Injurious Behavior No self-injurious ideation or behavior indicators observed or expressed   Agreement Not to Harm Self Yes  Description of Agreement verbal contract for safety  Danger to Others  Danger to Others None reported or observed
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
## 2022-12-20 NOTE — Progress Notes (Signed)
Pt was accepted to [facility] TODAY 12/20/2022; Bed Assignment 302-1  DX: MDD recurrent severe no psych fx.  Pt meets inpatient criteria per Lavonna Monarch  Attending Physician will be Dr. Phineas Inches, MD  Report can be called to: -Adult unit: [phone]  Pt can arrive after: [facility] will coordinate with care team. Per [facility] please pre-admit pt.  Care Team notified: Night [facility] Fransico Michael, RN, Norberta Keens, RN, Cloretta Ned, Adonis Brook, RN, Melynda Ripple, Counselor, Allen Kell, [address], [facility],MD, [facility], LCSWA 12/20/2022 @ 12:38 AM

## 2022-12-20 NOTE — [facility] Group Notes (Signed)
Patient did not attend the Wrap-up group. 

## 2022-12-21 ENCOUNTER — Encounter (HOSPITAL_COMMUNITY): Payer: Self-pay

## 2022-12-21 DIAGNOSIS — F250 Schizoaffective disorder, bipolar type: Principal | ICD-10-CM

## 2022-12-21 LAB — LIPID PANEL
Cholesterol: 218 mg/dL — ABNORMAL HIGH (ref 0–200)
HDL: 53 mg/dL (ref >40)
LDL Cholesterol: 149 mg/dL — ABNORMAL HIGH (ref 0–99)
Total CHOL/HDL Ratio: 4.1 RATIO
Triglycerides: 81 mg/dL (ref <150)
VLDL: 16 mg/dL (ref 0–40)

## 2022-12-21 LAB — TSH: TSH: 1.219 u[IU]/mL (ref 0.350–4.500)

## 2022-12-21 LAB — HEMOGLOBIN A1C
Hgb A1c MFr Bld: 4.4 % — ABNORMAL LOW (ref 4.8–5.6)
Mean Plasma Glucose: 79.58 mg/dL

## 2022-12-21 MED ORDER — SERTRALINE HCL 50 MG PO TABS
50.0000 mg | ORAL_TABLET | Freq: Every day | ORAL | Status: DC
  Administered 2022-12-22 – 2022-12-28 (×7): 50 mg via ORAL
  Filled 2022-12-21 (×9): qty 1

## 2022-12-21 NOTE — Progress Notes (Signed)
D: Pt alert and oriented. Unable to rate her anxiety and depression  Pt denies experiencing any SI/HI, or AVH at this time.   A: Support and encouragement provided. Frequent verbal contact made. Routine safety checks conducted q15 minutes.   R:  Pt verbally contracts for safety at this time. Pt complaint with medications and treatment plan. Pt isolates herself and not willing to participate in groups and feels extremely depressed.. Pt remains safe at this time. Will continue to monitor.  12/21/22 2048  Psych Admission Type (Psych Patients Only)  Admission Status Involuntary  Psychosocial Assessment  Patient Complaints Other (Comment) (She states that she wants to be positive)  Eye Contact Brief  Facial Expression Sad;Worried  Affect Depressed;Sad  Speech Slow;Soft  Interaction Guarded  Motor Activity Slow;Tremors  Appearance/Hygiene Body odor;Poor hygiene;In scrubs  Behavior Characteristics Unwilling to participate  Mood Depressed;Sad  Thought Process  Coherency WDL  Content WDL  Delusions None reported or observed  Perception WDL  Hallucination None reported or observed  Judgment Limited  Confusion None  Danger to Self  Current suicidal ideation? Passive  Self-Injurious Behavior No self-injurious ideation or behavior indicators observed or expressed   Agreement Not to Harm Self Yes  Description of Agreement verbal  Danger to Others  Danger to Others None reported or observed

## 2022-12-21 NOTE — Progress Notes (Signed)
Psychoeducational Group Note  Date:  12/21/2022 Time:  2208  Group Topic/Focus:  Relapse Prevention Planning:   The focus of this group is to define relapse and discuss the need for planning to combat relapse.  Participation Level: Did Not Attend  Participation Quality:  Not Applicable  Affect:  Not Applicable  Cognitive:  Not Applicable  Insight:  Not Applicable  Engagement in Group: Not Applicable  Additional Comments:  The patient did not attend the evening A.A. meeting.   Hazle Coca S 12/21/2022, 10:08 PM

## 2022-12-21 NOTE — Progress Notes (Signed)
Pt alert and oriented. She looks extremely depressed but endorses that she wants to be positive. Pt denies experiencing any SI/HI, or AVH at this time.  Her blood pressure was very low and her she had tremors on her hands. Blood sugar was 122.Notified to the provider. Encouraged patient to drink more fluids and monitored her continuously  Scheduled medications administered to pt, per MD orders. Support and encouragement provided. Frequent verbal contact made. Routine safety checks conducted q15 minutes.  Patient isolates herself and unwilling to participate in groups   12/21/22 0000  Psych Admission Type (Psych Patients Only)  Admission Status Involuntary  Psychosocial Assessment  Patient Complaints Depression;Worrying  Eye Contact Brief  Facial Expression Sad;Worried  Affect Depressed;Sad  Speech Slow;Soft  Interaction Guarded  Motor Activity Slow;Tremors  Appearance/Hygiene Body odor;Poor hygiene;In scrubs  Behavior Characteristics Unwilling to participate;Guarded  Mood Depressed;Pleasant  Thought Process  Coherency WDL  Content WDL  Delusions None reported or observed  Perception WDL  Hallucination None reported or observed  Judgment Limited  Confusion None  Danger to Self  Current suicidal ideation? Passive  Self-Injurious Behavior No self-injurious ideation or behavior indicators observed or expressed   Agreement Not to Harm Self Yes  Description of Agreement verbal  Danger to Others  Danger to Others None reported or observed

## 2022-12-21 NOTE — [facility] Suicide Risk Assessment (Signed)
[facility] INPATIENT:  Family/Significant Other Suicide Prevention Education  Suicide Prevention Education:  Family/Significant Other Refusal to Support Patient after Discharge:  Suicide Prevention Education Not Provided:  Patient has identified home of family/significant other as the place the patient will be residing after discharge.  With written consent of the patient, two attempts were made to provide Suicide Prevention Education to -[facility] (Mom) [phone].  This person indicates she will not be responsible for the patient after discharge.  Trinadee Verhagen S Sophronia Varney 12/21/2022,3:47 PM

## 2022-12-21 NOTE — Progress Notes (Signed)
[facility] MD Progress Note  12/21/2022 3:50 PM Yolanda [name]  MRN:  [id number]  Reason for admission: This is the first psychiatric admission in this [facility] in 12 years for this 53 AA female with an extensive hx of mental illnesses & probable polysubstance use disorders. Yolanda [name] is admitted to the [facility] from the [facility] hospital with complain of worsening suicidal ideations with plan to stab herself. Per chart review, patient apparently reported at the ED that Yolanda [name] has been depressed for a while & has not been taking her mental health medications. After medical evaluation.clearance, Yolanda [name] was transferred to the [facility] for further psychiatric evaluation/treatments.   Daily notes: Yolanda [name] is seen in her room. Yolanda [name] is lying down in bed during this evaluation. Chart reviewed. The chart findings discussed with the treatment team. Yolanda [name] presents alert, oriented to self & situation. Yolanda [name] reports today, "I'm doing fine. Everything is fine. I have not attended any groups yet because I was asleep. I did not sleep well last night. My appetite is good because I eat". Yolanda [name] currently denies any SIHI, AVH, delusional thoughts or paranoia. Yolanda [name] does not appear to be responding to any internal stimuli. Although reported doing fine, it was during this evaluation that it was noted from her behaviors, mannerisms, outlook & answers to the assessment questions that patient may be intellectually limited. At this present time, patient is a poor historian. Yolanda [name] is also homeless. This provider called patient's mother, Peaches Vanderbeck at [phone] for some collateral information about the patient. Yolanda [name] did not answer the phone. Left her a HIPAA friendly message to call back. At this time, patient is tolerating her treatment regimen. There are no side effects reported. There are no behavioral problems reported by staff. Will continue current plan of care as already in progress.  Principal Problem: Schizoaffective disorder (HCC)  Diagnosis: Principal  Problem:   Schizoaffective disorder (HCC) Active Problems:   MDD (major depressive disorder)  Total Time spent with patient:  35 minute  Past Psychiatric History: See H&P.  Past Medical History:  Past Medical History:  Diagnosis Date   Bipolar affect, depressed (HCC)    Depression     Past Surgical History:  Procedure Laterality Date   NO PAST SURGERIES     SALPINGECTOMY     Family History: History reviewed. No pertinent family history.  Family Psychiatric  History: See H&P.  Social History:  Social History   Substance and Sexual Activity  Alcohol Use Yes     Social History   Substance and Sexual Activity  Drug Use Yes   Types: Cocaine, Marijuana    Social History   Socioeconomic History   Marital status: Single    Spouse name: Not on file   Number of children: Not on file   Years of education: Not on file   Highest education level: Not on file  Occupational History   Not on file  Tobacco Use   Smoking status: Every Day   Smokeless tobacco: Not on file  Substance and Sexual Activity   Alcohol use: Yes   Drug use: Yes    Types: Cocaine, Marijuana   Sexual activity: Yes  Other Topics Concern   Not on file  Social History Narrative   Not on file   Social Determinants of Health   Financial Resource Strain: Not on file  Food Insecurity: Patient Declined (12/20/2022)   Hunger Vital Sign    Worried About Running Out of Food in the Last Year: Patient declined  Ran Out of Food in the Last Year: Patient declined  Transportation Needs: No Transportation Needs (12/20/2022)   PRAPARE - Administrator, Civil Service (Medical): No    Lack of Transportation (Non-Medical): No  Physical Activity: Not on file  Stress: Not on file  Social Connections: Not on file   Additional Social History:   Sleep: Good  Appetite:  Good  Current Medications: Current Facility-Administered Medications  Medication Dose Route Frequency Provider Last Rate Last  Admin   acetaminophen (TYLENOL) tablet 650 mg  650 mg Oral Q6H PRN [name], NP       alum & mag hydroxide-simeth (MAALOX/MYLANTA) 200-200-20 MG/5ML suspension 30 mL  30 mL Oral Q4H PRN [name], NP       busPIRone (BUSPAR) tablet 15 mg  15 mg Oral BID [name], NP   15 mg at 12/21/22 0844   diphenhydrAMINE (BENADRYL) capsule 50 mg  50 mg Oral TID PRN [name], NP       Or   diphenhydrAMINE (BENADRYL) injection 50 mg  50 mg Intramuscular TID PRN [name], NP       haloperidol (HALDOL) tablet 5 mg  5 mg Oral TID PRN [name], NP       Or   haloperidol lactate (HALDOL) injection 5 mg  5 mg Intramuscular TID PRN [name], NP       LORazepam (ATIVAN) tablet 2 mg  2 mg Oral TID PRN [name], NP       Or   LORazepam (ATIVAN) injection 2 mg  2 mg Intramuscular TID PRN [name], NP       magnesium hydroxide (MILK OF MAGNESIA) suspension 30 mL  30 mL Oral Daily PRN [name], NP       nicotine (NICODERM CQ - dosed in mg/24 hours) patch 14 mg  14 mg Transdermal Daily [name], NP       paliperidone (INVEGA) 24 hr tablet 3 mg  3 mg Oral Daily Brylan Seubert I, NP   3 mg at 12/21/22 0845   pantoprazole (PROTONIX) EC tablet 40 mg  40 mg Oral Daily [name], NP   40 mg at 12/21/22 0845   sertraline (ZOLOFT) tablet 150 mg  150 mg Oral Daily [name], NP   150 mg at 12/21/22 0845   traZODone (DESYREL) tablet 100 mg  100 mg Oral QHS PRN [name], NP       Lab Results:  Results for orders placed or performed during the hospital encounter of 12/20/22 (from the past 48 hour(s))  Glucose, capillary     Status: Abnormal   Collection Time: 12/20/22 10:35 PM  Result Value Ref Range   Glucose-Capillary 122 (H) 70 - 99 mg/dL    Comment: Glucose reference range applies only to samples taken after fasting for at least 8 hours.  Lipid panel     Status: Abnormal   Collection Time: 12/21/22  6:32 AM  Result Value Ref Range   Cholesterol 218 (H)  0 - 200 mg/dL   Triglycerides 81 <161 mg/dL   HDL 53 >09 mg/dL   Total CHOL/HDL Ratio 4.1 RATIO   VLDL 16 0 - 40 mg/dL   LDL Cholesterol 604 (H) 0 - 99 mg/dL    Comment:        Total Cholesterol/HDL:CHD Risk Coronary Heart Disease Risk Table  Men   Women  1/2 Average Risk   3.4   3.3  Average Risk       5.0   4.4  2 X Average Risk   9.6   7.1  3 X Average Risk  23.4   11.0        Use the calculated Patient Ratio above and the CHD Risk Table to determine the patient's CHD Risk.        ATP III CLASSIFICATION (LDL):  <100     mg/dL   Optimal  045-409  mg/dL   Near or Above                    Optimal  130-159  mg/dL   Borderline  811-914  mg/dL   High  >782     mg/dL   Very High Performed at [facility], [address], [place], [place] [address]   Hemoglobin A1c     Status: Abnormal   Collection Time: 12/21/22  6:32 AM  Result Value Ref Range   Hgb A1c MFr Bld 4.4 (L) 4.8 - 5.6 %    Comment: (NOTE) Pre diabetes:          5.7%-6.4%  Diabetes:              >6.4%  Glycemic control for   <7.0% adults with diabetes    Mean Plasma Glucose 79.58 mg/dL    Comment: Performed at [facility], [address], [place], [place] [address]  TSH     Status: None   Collection Time: 12/21/22  6:32 AM  Result Value Ref Range   TSH 1.219 0.350 - 4.500 uIU/mL    Comment: Performed by a 3rd Generation assay with a functional sensitivity of <=0.01 uIU/mL. Performed at [facility], [address], [place], [place] [address]    Blood Alcohol level:  Lab Results  Component Value Date   [facility] <10 12/19/2022   ETH <10 11/21/2019   Metabolic Disorder Labs: Lab Results  Component Value Date   HGBA1C 4.4 (L) 12/21/2022   MPG 79.58 12/21/2022   No results found for: "PROLACTIN" Lab Results  Component Value Date   CHOL 218 (H) 12/21/2022   TRIG 81 12/21/2022   HDL 53 12/21/2022   CHOLHDL 4.1 12/21/2022   VLDL 16  12/21/2022   LDLCALC 149 (H) 12/21/2022   LDLCALC 110 (H) 03/13/2011   Physical Findings: AIMS:  , ,  ,  ,    CIWA:    COWS:     Musculoskeletal: Strength & Muscle Tone: within normal limits Gait & Station: normal Patient leans: N/A  Psychiatric Specialty Exam:  Presentation  General Appearance:  Disheveled  Eye Contact: Fair  Speech: Clear and Coherent  Speech Volume: Increased  Handedness: Right   Mood and Affect  Mood: Anxious; Depressed; Angry; Irritable; Labile  Affect: Congruent; Tearful; Labile   Thought Process  Thought Processes: Coherent  Descriptions of Associations:Tangential  Orientation:Full (Time, Place and Person)  Thought Content:Tangential  History of Schizophrenia/Schizoaffective disorder:Yes  Duration of Psychotic Symptoms:Greater than six months  Hallucinations:Hallucinations: None  Ideas of Reference:None  Suicidal Thoughts:Suicidal Thoughts: No  Homicidal Thoughts:Homicidal Thoughts: No   Sensorium  Memory: Immediate Poor; Recent Poor; Remote Poor  Judgment: Fair  Insight: Lacking   Executive Functions  Concentration: Poor  Attention Span: Poor  Recall: Poor  Fund of Knowledge: Poor  Language: Fair   Psychomotor Activity  Psychomotor Activity: Psychomotor  Activity: Increased   Assets  Assets: Desire for Improvement; Resilience   Sleep  Sleep: Sleep: Good Number of Hours of Sleep: 7.5  Physical Exam: Physical Exam Vitals and nursing note reviewed.  HENT:     Nose: Nose normal.     Mouth/Throat:     Pharynx: Oropharynx is clear.  Eyes:     Pupils: Pupils are equal, round, and reactive to light.  Cardiovascular:     Rate and Rhythm: Normal rate.     Pulses: Normal pulses.     Comments: Elevated pulse rate (114). Pulmonary:     Effort: Pulmonary effort is normal.  Genitourinary:    Comments: Deferred Musculoskeletal:        General: Normal range of motion.     Cervical  back: Normal range of motion.  Skin:    General: Skin is warm and dry.  Neurological:     General: No focal deficit present.     Mental Status: Yolanda [name] is alert and oriented to person, place, and time.    Review of Systems  Constitutional:  Negative for chills, diaphoresis and fever.  HENT:  Negative for congestion and sore throat.   Respiratory:  Negative for cough, shortness of breath and wheezing.   Cardiovascular:  Negative for chest pain and palpitations.  Gastrointestinal:  Negative for abdominal pain, diarrhea, heartburn, nausea and vomiting.  Musculoskeletal:  Negative for myalgias and neck pain.  Skin:  Negative for itching and rash.  Neurological:  Negative for dizziness, tingling, tremors, sensory change, speech change, focal weakness, seizures, loss of consciousness, weakness and headaches.  Psychiatric/Behavioral:  Positive for depression. Negative for hallucinations, memory loss, substance abuse (Hx cocaine use disorder) and suicidal ideas. The patient is nervous/anxious. The patient does not have insomnia.    Blood pressure 104/74, pulse (!) 114, temperature 98.3 F (36.8 C), temperature source Oral, resp. rate 16, height 4\' 11"  (1.499 m), weight 63 kg, SpO2 100 %. Body mass index is 28.05 kg/m.  Treatment Plan Summary: Daily contact with patient to assess and evaluate symptoms and progress in treatment and Medication management.   Continue inpatient hospitalization.  Will continue today 12/21/2022 plan as below except where it is noted.   Principal/active diagnoses:  Schizoaffective disorder, bipolar type.    Associated symptoms.  Angry outburst. Suicidal ideations.  Crying spell.  Irritability.  Plan: Re-initiated:  -Continue Buspar 15 mg po bid for anxiety.  -Continue paliperidone 3 mg po daily for mood control.  -Continue Sertraline 50 mg po Q daily for depression. -Continue Trazodone 100 mg po Q bedtime prn for insomnia.  -Continue Nicoderm topically Q 24 hrs  for nicotine withdrawal management.   Agitation protocols: Cont as recommended;  -Benadryl 50 mg po or IM tid prn. -Haldol 5 mg po or IM tid prn.  -Lorazepam 2 mg po or IM tid prn.   Other PRNS -Continue Tylenol 650 mg every 6 hours PRN for mild pain -Continue Maalox 30 ml Q 4 hrs PRN for indigestion -Continue MOM 30 ml po Q 6 hrs for constipation   Safety and Monitoring: Voluntary admission to inpatient psychiatric unit for safety, stabilization and treatment Daily contact with patient to assess and evaluate symptoms and progress in treatment Patient's case to be discussed in multi-disciplinary team meeting Observation Level : q15 minute checks Vital signs: q12 hours Precautions: Safety   Discharge Planning: Social work and case management to assist with discharge planning and identification of hospital follow-up needs prior to discharge Estimated LOS: 5-7  days Discharge Concerns: Need to establish a safety plan; Medication compliance and effectiveness Discharge Goals: Return home with outpatient referrals for mental health follow-up including medication management/psychotherapy  Armandina Stammer, NP, pmhnp, fnp-bc. 12/21/2022, 3:50 PM

## 2022-12-21 NOTE — BH IP Treatment Plan (Signed)
Interdisciplinary Treatment and Diagnostic Plan   12/21/2022 Time of Session: 1030 Yolanda [name] MRN: [id number]  Principal Diagnosis: Schizoaffective disorder (HCC)  Secondary Diagnoses: Principal Problem:   Schizoaffective disorder (HCC) Active Problems:   MDD (major depressive disorder)   Current Medications:  Current Facility-Administered Medications  Medication Dose Route Frequency Provider Last Rate Last Admin   acetaminophen (TYLENOL) tablet 650 mg  650 mg Oral Q6H PRN [name], NP       alum & mag hydroxide-simeth (MAALOX/MYLANTA) 200-200-20 MG/5ML suspension 30 mL  30 mL Oral Q4H PRN [name], NP       busPIRone (BUSPAR) tablet 15 mg  15 mg Oral BID [name], NP   15 mg at 12/21/22 0844   diphenhydrAMINE (BENADRYL) capsule 50 mg  50 mg Oral TID PRN [name], NP       Or   diphenhydrAMINE (BENADRYL) injection 50 mg  50 mg Intramuscular TID PRN [name], NP       haloperidol (HALDOL) tablet 5 mg  5 mg Oral TID PRN [name], NP       Or   haloperidol lactate (HALDOL) injection 5 mg  5 mg Intramuscular TID PRN [name], NP       LORazepam (ATIVAN) tablet 2 mg  2 mg Oral TID PRN [name], NP       Or   LORazepam (ATIVAN) injection 2 mg  2 mg Intramuscular TID PRN [name], NP       magnesium hydroxide (MILK OF MAGNESIA) suspension 30 mL  30 mL Oral Daily PRN [name], NP       nicotine (NICODERM CQ - dosed in mg/24 hours) patch 14 mg  14 mg Transdermal Daily [name], NP       paliperidone (INVEGA) 24 hr tablet 3 mg  3 mg Oral Daily Nwoko, Agnes I, NP   3 mg at 12/21/22 0845   pantoprazole (PROTONIX) EC tablet 40 mg  40 mg Oral Daily [name], NP   40 mg at 12/21/22 0845   sertraline (ZOLOFT) tablet 150 mg  150 mg Oral Daily [name], NP   150 mg at 12/21/22 0845   traZODone (DESYREL) tablet 100 mg  100 mg Oral QHS PRN [name], NP       PTA Medications: Medications Prior to Admission  Medication  Sig Dispense Refill Last Dose   busPIRone (BUSPAR) 15 MG tablet Take 15 mg by mouth 2 (two) times daily. (Patient not taking: Reported on 12/19/2022)      paliperidone (INVEGA SUSTENNA) 156 MG/ML SUSY injection Inject 156 mg into the muscle once. (Patient not taking: Reported on 12/19/2022)      sertraline (ZOLOFT) 50 MG tablet Take 150 mg by mouth daily. (Patient not taking: Reported on 12/19/2022)      traZODone (DESYREL) 100 MG tablet Take 100 mg by mouth at bedtime as needed for sleep. (Patient not taking: Reported on 11/12/2022)       Patient Stressors: Medication change or noncompliance    Patient Strengths: Forensic psychologist fund of knowledge   Treatment Modalities: Medication Management, Group therapy, Case management,  1 to 1 session with clinician, Psychoeducation, Recreational therapy.   Physician Treatment Plan for Primary Diagnosis: Schizoaffective disorder (HCC) Long Term Goal(s): Improvement in symptoms so as ready for discharge   Short Term Goals: Ability to identify and develop effective coping behaviors will improve Ability to maintain clinical measurements within normal  limits will improve Compliance with prescribed medications will improve Ability to identify triggers associated with substance abuse/mental health issues will improve Ability to identify changes in lifestyle to reduce recurrence of condition will improve Ability to verbalize feelings will improve Ability to disclose and discuss suicidal ideas Ability to demonstrate self-control will improve  Medication Management: Evaluate patient's response, side effects, and tolerance of medication regimen.  Therapeutic Interventions: 1 to 1 sessions, Unit Group sessions and Medication administration.  Evaluation of Outcomes: Progressing  Physician Treatment Plan for Secondary Diagnosis: Principal Problem:   Schizoaffective disorder (HCC) Active Problems:   MDD (major depressive disorder)  Long Term  Goal(s): Improvement in symptoms so as ready for discharge   Short Term Goals: Ability to identify and develop effective coping behaviors will improve Ability to maintain clinical measurements within normal limits will improve Compliance with prescribed medications will improve Ability to identify triggers associated with substance abuse/mental health issues will improve Ability to identify changes in lifestyle to reduce recurrence of condition will improve Ability to verbalize feelings will improve Ability to disclose and discuss suicidal ideas Ability to demonstrate self-control will improve     Medication Management: Evaluate patient's response, side effects, and tolerance of medication regimen.  Therapeutic Interventions: 1 to 1 sessions, Unit Group sessions and Medication administration.  Evaluation of Outcomes: Progressing   RN Treatment Plan for Primary Diagnosis: Schizoaffective disorder (HCC) Long Term Goal(s): Knowledge of disease and therapeutic regimen to maintain health will improve  Short Term Goals: Ability to remain free from injury will improve, Ability to verbalize frustration and anger appropriately will improve, Ability to demonstrate self-control, Ability to participate in decision making will improve, Ability to verbalize feelings will improve, Ability to disclose and discuss suicidal ideas, Ability to identify and develop effective coping behaviors will improve, and Compliance with prescribed medications will improve  Medication Management: RN will administer medications as ordered by provider, will assess and evaluate patient's response and provide education to patient for prescribed medication. RN will report any adverse and/or side effects to prescribing provider.  Therapeutic Interventions: 1 on 1 counseling sessions, Psychoeducation, Medication administration, Evaluate responses to treatment, Monitor vital signs and CBGs as ordered, Perform/monitor CIWA, COWS,  AIMS and Fall Risk screenings as ordered, Perform wound care treatments as ordered.  Evaluation of Outcomes: Progressing   LCSW Treatment Plan for Primary Diagnosis: Schizoaffective disorder (HCC) Long Term Goal(s): Safe transition to appropriate next level of care at discharge, Engage patient in therapeutic group addressing interpersonal concerns.  Short Term Goals: Engage patient in aftercare planning with referrals and resources, Increase social support, Increase ability to appropriately verbalize feelings, Increase emotional regulation, Facilitate acceptance of mental health diagnosis and concerns, Facilitate patient progression through stages of change regarding substance use diagnoses and concerns, Identify triggers associated with mental health/substance abuse issues, and Increase skills for wellness and recovery  Therapeutic Interventions: Assess for all discharge needs, 1 to 1 time with Social worker, Explore available resources and support systems, Assess for adequacy in community support network, Educate family and significant other(s) on suicide prevention, Complete Psychosocial Assessment, Interpersonal group therapy.  Evaluation of Outcomes: Progressing   Progress in Treatment: Attending groups: Yes. Participating in groups: No. Taking medication as prescribed: Yes. Toleration medication: Yes. Family/Significant other contact made: Yes, individual(s) contacted:  Anaveah Sangiovanni (Mom) [phone]. Patient understands diagnosis: No. Discussing patient identified problems/goals with staff: Yes. Medical problems stabilized or resolved: Yes. Denies suicidal/homicidal ideation: Yes. Issues/concerns per patient self-inventory: Yes. Other: N/A  New problem(s)  identified: No, Describe:  None reported  New Short Term/Long Term Goal(s): medication stabilization, elimination of SI thoughts, development of comprehensive mental wellness plan.    Patient Goals: Medication  Stabilization  Discharge Plan or Barriers: Patient recently admitted. CSW will continue to follow and assess for appropriate referrals and possible discharge planning.      Reason for Continuation of Hospitalization: Anxiety Depression Medication stabilization Suicidal ideation  Estimated Length of Stay: 3-7 Days  Last 3 [place] Suicide Severity Risk Score: Flowsheet Row [facility] (Current[facility] ED from [facility]/12/2022 in [facility] Emergency Department at [facility]  C-SSRS RISK CATEGORY High Risk High Risk Moderate Risk       Last [facility] 2/9 Scores:     No data to display          medication stabilization, elimination of SI thoughts, development of comprehensive mental wellness plan.    Scribe for Treatment Team: Ane Payment, LCSW 12/21/2022 1:44 PM

## 2022-12-21 NOTE — Progress Notes (Addendum)
D:  Patient's self inventory sheet, patient sleeps good, no sleep medicine.  Fair appetite, low energy level, poor concentration.  Rated depression, anxiety and hopeless #10.  Denied withdrawals.  Denied SI.  Denied physical problems.  Stated she does have L wrist pain approximately one month occasionally.  Denied discharge plans. R:  Medications administered per MD orders.  Emotional support and encouragement given patient. R:  Safety maintained with 15 minute checks.  Denied SI and HI, contracts for safety.  Denied A/V hallucinations.
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
## 2022-12-21 NOTE — Plan of Care (Signed)
Discussed anxiety, depression and coping skills with patient.  

## 2022-12-22 DIAGNOSIS — F250 Schizoaffective disorder, bipolar type: Secondary | ICD-10-CM | POA: Diagnosis not present

## 2022-12-22 NOTE — Progress Notes (Addendum)
D. Pt presents with a sad affect, somewhat improved mood,  has been more visible on the unit today, observed interacting well with a peer.  Pt currently denies SI/HI and AVH and does not appear to be responding to internal stimuli.  A. Labs and vitals monitored. Pt given and educated on medications. Pt supported emotionally and encouraged to express concerns and ask questions.   R. Pt remains safe with 15 minute checks. Will continue POC.    12/22/22 1300  Psych Admission Type (Psych Patients Only)  Admission Status Involuntary  Psychosocial Assessment  Patient Complaints Depression  Eye Contact Fair  Facial Expression Sad  Affect Sad  Speech Soft  Interaction Minimal  Motor Activity Tremors;Slow  Appearance/Hygiene Poor hygiene  Behavior Characteristics Cooperative  Mood Depressed;Pleasant  Thought Process  Coherency WDL  Content WDL  Delusions None reported or observed  Perception WDL  Hallucination None reported or observed  Judgment Limited  Confusion None  Danger to Self  Current suicidal ideation? Denies  Self-Injurious Behavior No self-injurious ideation or behavior indicators observed or expressed   Agreement Not to Harm Self Yes  Description of Agreement agrees to contact staff before acting on harmful thoughts  Danger to Others  Danger to Others None reported or observed

## 2022-12-22 NOTE — Progress Notes (Signed)
[facility] MD Progress Note  12/22/2022 3:51 PM Deyna AEVA ALSMAN  MRN:  [id number]  Reason for admission: This is the first psychiatric admission in this [facility] in 12 years for this 33 AA female with an extensive hx of mental illnesses & probable polysubstance use disorders. She is admitted to the [facility] from the [facility] hospital with complain of worsening suicidal ideations with plan to stab herself. Per chart review, patient apparently reported at the ED that she has been depressed for a while & has not been taking her mental health medications. After medical evaluation.clearance, she was transferred to the [facility] for further psychiatric evaluation/treatments.   Daily notes: Eloyce is seen in her room. She is lying down in bed during this evaluation just like yesterday. Chart reviewed. The chart findings discussed with the treatment team. She presents alert, oriented to self & situation. She reports today, "I'm fine. I did not go to group this morning because I didn't want to. I slept well last night. 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 Per information obtained from care everywhere, patient had spent most of her life in a group home setting. She also has been dismissed from her group home residence at different times in the past. At this time, she is taking her medications when it is offered to her. There are no behavioral problems reported by staff. Patient is currently homeless. May need/benefit from Act team referral upon discharge. She currently denies any SIHI, AVH, delusional thoughts or paranoia. She does not appear to be responding to any internal stimuli. Will continue current plan of care as already in progress. Reviewed vital signs, stable.  Principal Problem: Schizoaffective disorder (HCC)  Diagnosis: Principal Problem:   Schizoaffective disorder (HCC) Active Problems:   MDD (major depressive disorder)  Total Time spent with patient:  35 minute  Past Psychiatric  History: See H&P.  Past Medical History:  Past Medical History:  Diagnosis Date   Bipolar affect, depressed (HCC)    Depression     Past Surgical History:  Procedure Laterality Date   NO PAST SURGERIES     SALPINGECTOMY     Family History: History reviewed. No pertinent family history.  Family Psychiatric  History: See H&P.  Social History:  Social History   Substance and Sexual Activity  Alcohol Use Yes     Social History   Substance and Sexual Activity  Drug Use Yes   Types: Cocaine, Marijuana    Social History   Socioeconomic History   Marital status: Single    Spouse name: Not on file   Number of children: Not on file   Years of education: Not on file   Highest education level: Not on file  Occupational History   Not on file  Tobacco Use   Smoking status: Every Day   Smokeless tobacco: Not on file  Substance and Sexual Activity   Alcohol use: Yes   Drug use: Yes    Types: Cocaine, Marijuana   Sexual activity: Yes  Other Topics Concern   Not on file  Social History Narrative   Not on file   Social Determinants of Health   Financial Resource Strain: Not on file  Food Insecurity: Patient Declined (12/20/2022)   Hunger Vital Sign    Worried About Running Out of Food in the Last Year: Patient declined    Ran Out of Food in the Last Year: Patient declined  Transportation Needs: No Transportation Needs (12/20/2022)   PRAPARE - Transportation  Lack of Transportation (Medical): No    Lack of Transportation (Non-Medical): No  Physical Activity: Not on file  Stress: Not on file  Social Connections: Not on file   Additional Social History:   Sleep: Good  Appetite:  Good  Current Medications: Current Facility-Administered Medications  Medication Dose Route Frequency Provider Last Rate Last Admin   acetaminophen (TYLENOL) tablet 650 mg  650 mg Oral Q6H PRN [name], NP       alum & mag hydroxide-simeth (MAALOX/MYLANTA) 200-200-20 MG/5ML  suspension 30 mL  30 mL Oral Q4H PRN [name], NP       busPIRone (BUSPAR) tablet 15 mg  15 mg Oral BID [name], NP   15 mg at 12/22/22 9604   diphenhydrAMINE (BENADRYL) capsule 50 mg  50 mg Oral TID PRN [name], NP       Or   diphenhydrAMINE (BENADRYL) injection 50 mg  50 mg Intramuscular TID PRN [name], NP       haloperidol (HALDOL) tablet 5 mg  5 mg Oral TID PRN [name], NP       Or   haloperidol lactate (HALDOL) injection 5 mg  5 mg Intramuscular TID PRN [name], NP       LORazepam (ATIVAN) tablet 2 mg  2 mg Oral TID PRN [name], NP       Or   LORazepam (ATIVAN) injection 2 mg  2 mg Intramuscular TID PRN [name], NP       magnesium hydroxide (MILK OF MAGNESIA) suspension 30 mL  30 mL Oral Daily PRN [name], NP       nicotine (NICODERM CQ - dosed in mg/24 hours) patch 14 mg  14 mg Transdermal Daily [name], NP       paliperidone (INVEGA) 24 hr tablet 3 mg  3 mg Oral Daily Bobie Caris I, NP   3 mg at 12/22/22 0832   pantoprazole (PROTONIX) EC tablet 40 mg  40 mg Oral Daily [name], NP   40 mg at 12/22/22 5409   sertraline (ZOLOFT) tablet 50 mg  50 mg Oral Daily [name], NP   50 mg at 12/22/22 8119   traZODone (DESYREL) tablet 100 mg  100 mg Oral QHS PRN [name], NP       Lab Results:  Results for orders placed or performed during the hospital encounter of 12/20/22 (from the past 48 hour(s))  Glucose, capillary     Status: Abnormal   Collection Time: 12/20/22 10:35 PM  Result Value Ref Range   Glucose-Capillary 122 (H) 70 - 99 mg/dL    Comment: Glucose reference range applies only to samples taken after fasting for at least 8 hours.  Lipid panel     Status: Abnormal   Collection Time: 12/21/22  6:32 AM  Result Value Ref Range   Cholesterol 218 (H) 0 - 200 mg/dL   Triglycerides 81 <147 mg/dL   HDL 53 >82 mg/dL   Total CHOL/HDL Ratio 4.1 RATIO   VLDL 16 0 - 40 mg/dL   LDL Cholesterol 956 (H) 0 - 99  mg/dL    Comment:        Total Cholesterol/HDL:CHD Risk Coronary Heart Disease Risk Table                     Men   Women  1/2 Average Risk   3.4   3.3  Average Risk  5.0   4.4  2 X Average Risk   9.6   7.1  3 X Average Risk  23.4   11.0        Use the calculated Patient Ratio above and the CHD Risk Table to determine the patient's CHD Risk.        ATP III CLASSIFICATION (LDL):  <100     mg/dL   Optimal  161-096  mg/dL   Near or Above                    Optimal  130-159  mg/dL   Borderline  045-409  mg/dL   High  >811     mg/dL   Very High Performed at [facility], [address] [place], [place] [address]   Hemoglobin A1c     Status: Abnormal   Collection Time: 12/21/22  6:32 AM  Result Value Ref Range   Hgb A1c MFr Bld 4.4 (L) 4.8 - 5.6 %    Comment: (NOTE) Pre diabetes:          5.7%-6.4%  Diabetes:              >6.4%  Glycemic control for   <7.0% adults with diabetes    Mean Plasma Glucose 79.58 mg/dL    Comment: Performed at [facility], [address], [place], [place] [address]  TSH     Status: None   Collection Time: 12/21/22  6:32 AM  Result Value Ref Range   TSH 1.219 0.350 - 4.500 uIU/mL    Comment: Performed by a 3rd Generation assay with a functional sensitivity of <=0.01 uIU/mL. Performed at [facility], [address], [place], [place] [address]    Blood Alcohol level:  Lab Results  Component Value Date   [facility] <10 12/19/2022   ETH <10 11/21/2019   Metabolic Disorder Labs: Lab Results  Component Value Date   HGBA1C 4.4 (L) 12/21/2022   MPG 79.58 12/21/2022   No results found for: "PROLACTIN" Lab Results  Component Value Date   CHOL 218 (H) 12/21/2022   TRIG 81 12/21/2022   HDL 53 12/21/2022   CHOLHDL 4.1 12/21/2022   VLDL 16 12/21/2022   LDLCALC 149 (H) 12/21/2022   LDLCALC 110 (H) 03/13/2011   Physical Findings: AIMS:  , ,  ,  ,    CIWA:    COWS:     Musculoskeletal: Strength &  Muscle Tone: within normal limits Gait & Station: normal Patient leans: N/A  Psychiatric Specialty Exam:  Presentation  General Appearance:  Disheveled  Eye Contact: Fair  Speech: Clear and Coherent  Speech Volume: Increased  Handedness: Right   Mood and Affect  Mood: Anxious; Depressed; Angry; Irritable; Labile  Affect: Congruent; Tearful; Labile   Thought Process  Thought Processes: Coherent  Descriptions of Associations:Tangential  Orientation:Full (Time, Place and Person)  Thought Content:Tangential  History of Schizophrenia/Schizoaffective disorder:Yes  Duration of Psychotic Symptoms:Greater than six months  Hallucinations:No data recorded  Ideas of Reference:None  Suicidal Thoughts:No data recorded  Homicidal Thoughts:No data recorded   Sensorium  Memory: Immediate Poor; Recent Poor; Remote Poor  Judgment: Fair  Insight: Lacking   Executive Functions  Concentration: Poor  Attention Span: Poor  Recall: Poor  Fund of Knowledge: Poor  Language: Fair   Psychomotor Activity  Psychomotor Activity: No data recorded   Assets  Assets: Desire for Improvement; Resilience   Sleep  Sleep: No data recorded  Physical Exam:  Physical Exam Vitals and nursing note reviewed.  HENT:     Nose: Nose normal.     Mouth/Throat:     Pharynx: Oropharynx is clear.  Eyes:     Pupils: Pupils are equal, round, and reactive to light.  Cardiovascular:     Rate and Rhythm: Normal rate.     Pulses: Normal pulses.     Comments: Elevated pulse rate (114). Pulmonary:     Effort: Pulmonary effort is normal.  Genitourinary:    Comments: Deferred Musculoskeletal:        General: Normal range of motion.     Cervical back: Normal range of motion.  Skin:    General: Skin is warm and dry.  Neurological:     General: No focal deficit present.     Mental Status: She is alert and oriented to person, place, and time.    Review of Systems   Constitutional:  Negative for chills, diaphoresis and fever.  HENT:  Negative for congestion and sore throat.   Respiratory:  Negative for cough, shortness of breath and wheezing.   Cardiovascular:  Negative for chest pain and palpitations.  Gastrointestinal:  Negative for abdominal pain, diarrhea, heartburn, nausea and vomiting.  Musculoskeletal:  Negative for myalgias and neck pain.  Skin:  Negative for itching and rash.  Neurological:  Negative for dizziness, tingling, tremors, sensory change, speech change, focal weakness, seizures, loss of consciousness, weakness and headaches.  Psychiatric/Behavioral:  Positive for depression. Negative for hallucinations, memory loss, substance abuse (Hx cocaine use disorder) and suicidal ideas. The patient is nervous/anxious. The patient does not have insomnia.    Blood pressure 93/83, pulse 85, temperature 98.3 F (36.8 C), temperature source Oral, resp. rate 16, height 4\' 11"  (1.499 m), weight 63 kg, SpO2 100 %. Body mass index is 28.05 kg/m.  Treatment Plan Summary: Daily contact with patient to assess and evaluate symptoms and progress in treatment and Medication management.   Continue inpatient hospitalization.  Will continue today 12/22/2022 plan as below except where it is noted.   Principal/active diagnoses:  Schizoaffective disorder, bipolar type.    Associated symptoms.  Angry outburst. Suicidal ideations.  Crying spell.  Irritability.  Plan: Re-initiated:  -Continue Buspar 15 mg po bid for anxiety.  -Continue paliperidone 3 mg po daily for mood control.  -Continue Sertraline 50 mg po Q daily for depression. -Continue Trazodone 100 mg po Q bedtime prn for insomnia.  -Continue Nicoderm topically Q 24 hrs for nicotine withdrawal management.   Agitation protocols: Cont as recommended;  -Benadryl 50 mg po or IM tid prn. -Haldol 5 mg po or IM tid prn.  -Lorazepam 2 mg po or IM tid prn.   Other PRNS -Continue Tylenol 650 mg every  6 hours PRN for mild pain -Continue Maalox 30 ml Q 4 hrs PRN for indigestion -Continue MOM 30 ml po Q 6 hrs for constipation   Safety and Monitoring: Voluntary admission to inpatient psychiatric unit for safety, stabilization and treatment Daily contact with patient to assess and evaluate symptoms and progress in treatment Patient's case to be discussed in multi-disciplinary team meeting Observation Level : q15 minute checks Vital signs: q12 hours Precautions: Safety   Discharge Planning: Social work and case management to assist with discharge planning and identification of hospital follow-up needs prior to discharge Estimated LOS: 5-7 days Discharge Concerns: Need to establish a safety plan; Medication compliance and effectiveness Discharge Goals: Return home with outpatient referrals for mental health follow-up including medication management/psychotherapy  Armandina Stammer, NP, pmhnp, fnp-bc. 12/22/2022, 3:51 PMPatient ID: Larey Brick, female   DOB: Nov 06, 1975, 47 y.o.   MRN: [id number]

## 2022-12-22 NOTE — Group Note (Signed)
Date:  12/22/2022 Time:  6:51 PM  Group Topic/Focus:  Goals Group:   The focus of this group is to help patients establish daily goals to achieve during treatment and discuss how the patient can incorporate goal setting into their daily lives to aide in recovery. Orientation:   The focus of this group is to educate the patient on the purpose and policies of crisis stabilization and provide a format to answer questions about their admission.  The group details unit policies and expectations of patients while admitted.    Participation Level:  Did Not Attend  Participation Quality:   n/a  Affect:   n/a  Cognitive:   n/a  Insight: None  Engagement in Group:   n/a  Modes of Intervention:   n/a  Additional Comments:   Pt did not attend.  [name] 12/22/2022, 6:51 PM

## 2022-12-22 NOTE — Group Note (Signed)
Date:  12/22/2022 Time:  9:04 PM  Group Topic/Focus:  Wrap-Up Group:   The focus of this group is to help patients review their daily goal of treatment and discuss progress on daily workbooks.    Participation Level:  Did Not Attend

## 2022-12-23 DIAGNOSIS — F250 Schizoaffective disorder, bipolar type: Secondary | ICD-10-CM | POA: Diagnosis not present

## 2022-12-23 NOTE — Plan of Care (Signed)
?  Problem: Coping: ?Goal: Coping ability will improve ?Outcome: Progressing  ?Problem: Health Behavior/Discharge Planning: ?Goal: Compliance with therapeutic regimen will improve ?Outcome: Progressing ?  ?

## 2022-12-23 NOTE — BHH Group Notes (Signed)
BHH Group Notes:  (Nursing/MHT/Case Management/Adjunct)  Date:  12/23/2022  Time:0900  Type of Therapy:  Psychoeducational Skills/Daily Goals Group  Participation Level:  Did Not Attend  Participation Quality:  na  Affect:  na  Cognitive:  na  Insight:  None  Engagement in Group:  na  Modes of Intervention:  na  Summary of Progress/Problems: Pt did not attend.   Yolanda [name] 12/23/2022, 11:00 AM 

## 2022-12-23 NOTE — Progress Notes (Signed)
   12/23/22 0500  Psych Admission Type (Psych Patients Only)  Admission Status Involuntary  Psychosocial Assessment  Patient Complaints Hopelessness;Worrying  Eye Contact Brief  Facial Expression Sad;Worried  Affect Depressed;Sad  Speech Slow;Soft  Interaction Guarded  Motor Activity Slow;Tremors  Appearance/Hygiene Body odor;Poor hygiene;In scrubs  Behavior Characteristics Unable to participate  Mood Depressed  Thought Process  Coherency WDL  Content WDL  Delusions None reported or observed  Perception WDL  Hallucination None reported or observed  Judgment Limited  Confusion None  Danger to Self  Current suicidal ideation?  (denies)  Self-Injurious Behavior No self-injurious ideation or behavior indicators observed or expressed   Agreement Not to Harm Self Yes  Description of Agreement verbal  Danger to Others  Danger to Others None reported or observed   D: Pt alert and oriented.   Pt denies experiencing any SI/HI, or AVH at this time.   A: Patient's BP was 97/81. Gave her Gatorade couple of times . Support and encouragement provided. Frequent verbal contact made. Routine safety checks conducted q15 minutes.   R: No adverse drug reactions noted. Pt verbally contracts for safety at this time. Pt complaint with medications and treatment plan. Encouraged to participate in the groups and patient was able to come out of the room but not able to participate. She got motivated to come out for her breakfast yesterday (6/15).Pt remains safe at this time. Will continue to monitor.

## 2022-12-23 NOTE — Progress Notes (Signed)
   12/23/22 2115  Psych Admission Type (Psych Patients Only)  Admission Status Involuntary  Psychosocial Assessment  Patient Complaints None  Eye Contact Fair  Facial Expression Flat  Speech Soft  Interaction Minimal  Motor Activity Slow;Tremors  Appearance/Hygiene Disheveled  Behavior Characteristics Cooperative;Appropriate to situation  Mood Pleasant  Thought Process  Coherency WDL  Content WDL  Delusions None reported or observed  Perception WDL  Hallucination None reported or observed  Judgment Limited  Confusion None  Danger to Self  Current suicidal ideation? Denies  Self-Injurious Behavior No self-injurious ideation or behavior indicators observed or expressed   Agreement Not to Harm Self Yes  Description of Agreement verbal  Danger to Others  Danger to Others None reported or observed

## 2022-12-23 NOTE — Progress Notes (Signed)
   12/23/22 1000  Psych Admission Type (Psych Patients Only)  Admission Status Involuntary  Psychosocial Assessment  Patient Complaints Worrying  Eye Contact Fair  Facial Expression Sad  Affect Sad  Speech Soft  Interaction Minimal  Motor Activity Slow;Tremors  Appearance/Hygiene Disheveled  Behavior Characteristics Cooperative  Mood Depressed  Thought Process  Coherency WDL  Content WDL  Delusions None reported or observed  Perception WDL  Hallucination None reported or observed  Judgment Limited  Confusion None  Danger to Self  Current suicidal ideation? Denies  Self-Injurious Behavior No self-injurious ideation or behavior indicators observed or expressed   Agreement Not to Harm Self Yes  Description of Agreement verbal contract for safety  Danger to Others  Danger to Others None reported or observed

## 2022-12-23 NOTE — Progress Notes (Signed)
[facility] MD Progress Note  12/23/2022 2:13 PM Yolanda [name]  MRN:  [id number]  Reason for admission: This is the first psychiatric admission in this [facility] in 12 years for this 47 AA female with an extensive hx of mental illnesses & probable polysubstance use disorders. She is admitted to the [facility] from the [facility] hospital with complain of worsening suicidal ideations with plan to stab herself. Per chart review, patient apparently reported at the ED that she has been depressed for a while & has not been taking her mental health medications. After medical evaluation.clearance, she was transferred to the [facility] for further psychiatric evaluation/treatments.   Daily notes:  Yolanda [name] is seen in her room today. She is lying down in bed during this evaluation. She is reading her bible. Chart reviewed. The chart findings discussed with the treatment team. She presents alert, oriented to self & situation. She is making a good eye contact. She reports, "I'm fine, but I'm depressed too because I don't know how to be positive. I did not attend group session this morning because I wanna read my bible". Yolanda [name] currently denies any SIHI, AVH, delusional thoughts or paranoia. She does not appear to be responding to any internal stimuli. As already mentioned, Yolanda [name] appears to be intellectually limited. She required a lot of reminders & encouragement to get out of bed, attend group sessions & go to the cafeteria with the other patients to eat. There are no behavioral problems noted or reported by staff. She rates her depression #10 & anxiety #8. She says this is because she does not know how to be positive. There are no changes made on her current plan of care. She denies any side effects from her medications. Will continue as already in progress.  Principal Problem: Schizoaffective disorder (HCC)  Diagnosis: Principal Problem:   Schizoaffective disorder (HCC) Active Problems:   MDD (major depressive disorder)  Total  Time spent with patient:  25 minute  Past Psychiatric History: See H&P.  Past Medical History:  Past Medical History:  Diagnosis Date   Bipolar affect, depressed (HCC)    Depression     Past Surgical History:  Procedure Laterality Date   NO PAST SURGERIES     SALPINGECTOMY     Family History: History reviewed. No pertinent family history.  Family Psychiatric  History: See H&P.  Social History:  Social History   Substance and Sexual Activity  Alcohol Use Yes     Social History   Substance and Sexual Activity  Drug Use Yes   Types: Cocaine, Marijuana    Social History   Socioeconomic History   Marital status: Single    Spouse name: Not on file   Number of children: Not on file   Years of education: Not on file   Highest education level: Not on file  Occupational History   Not on file  Tobacco Use   Smoking status: Every Day   Smokeless tobacco: Not on file  Substance and Sexual Activity   Alcohol use: Yes   Drug use: Yes    Types: Cocaine, Marijuana   Sexual activity: Yes  Other Topics Concern   Not on file  Social History Narrative   Not on file   Social Determinants of Health   Financial Resource Strain: Not on file  Food Insecurity: Patient Declined (12/20/2022)   Hunger Vital Sign    Worried About Running Out of Food in the Last Year: Patient declined    Ran Out of  Food in the Last Year: Patient declined  Transportation Needs: No Transportation Needs (12/20/2022)   PRAPARE - Administrator, Civil Service (Medical): No    Lack of Transportation (Non-Medical): No  Physical Activity: Not on file  Stress: Not on file  Social Connections: Not on file   Additional Social History:   Sleep: Good  Appetite:  Good  Current Medications: Current Facility-Administered Medications  Medication Dose Route Frequency Provider Last Rate Last Admin   acetaminophen (TYLENOL) tablet 650 mg  650 mg Oral Q6H PRN [name], NP       alum & mag  hydroxide-simeth (MAALOX/MYLANTA) 200-200-20 MG/5ML suspension 30 mL  30 mL Oral Q4H PRN [name], NP       busPIRone (BUSPAR) tablet 15 mg  15 mg Oral BID [name], NP   15 mg at 12/23/22 4098   diphenhydrAMINE (BENADRYL) capsule 50 mg  50 mg Oral TID PRN [name], NP       Or   diphenhydrAMINE (BENADRYL) injection 50 mg  50 mg Intramuscular TID PRN [name], NP       haloperidol (HALDOL) tablet 5 mg  5 mg Oral TID PRN [name], NP       Or   haloperidol lactate (HALDOL) injection 5 mg  5 mg Intramuscular TID PRN [name], NP       LORazepam (ATIVAN) tablet 2 mg  2 mg Oral TID PRN [name], NP       Or   LORazepam (ATIVAN) injection 2 mg  2 mg Intramuscular TID PRN [name], NP       magnesium hydroxide (MILK OF MAGNESIA) suspension 30 mL  30 mL Oral Daily PRN [name], NP       nicotine (NICODERM CQ - dosed in mg/24 hours) patch 14 mg  14 mg Transdermal Daily [name], NP       paliperidone (INVEGA) 24 hr tablet 3 mg  3 mg Oral Daily Anasha Perfecto I, NP   3 mg at 12/23/22 0826   pantoprazole (PROTONIX) EC tablet 40 mg  40 mg Oral Daily [name], NP   40 mg at 12/23/22 1191   sertraline (ZOLOFT) tablet 50 mg  50 mg Oral Daily [name], NP   50 mg at 12/23/22 4782   traZODone (DESYREL) tablet 100 mg  100 mg Oral QHS PRN [name], NP       Lab Results:  No results found for this or any previous visit (from the past 48 hour(s)).  Blood Alcohol level:  Lab Results  Component Value Date   ETH <10 12/19/2022   ETH <10 11/21/2019   Metabolic Disorder Labs: Lab Results  Component Value Date   HGBA1C 4.4 (L) 12/21/2022   MPG 79.58 12/21/2022   No results found for: "PROLACTIN" Lab Results  Component Value Date   CHOL 218 (H) 12/21/2022   TRIG 81 12/21/2022   HDL 53 12/21/2022   CHOLHDL 4.1 12/21/2022   VLDL 16 12/21/2022   LDLCALC 149 (H) 12/21/2022   LDLCALC 110 (H) 03/13/2011   Physical Findings: AIMS:  , ,   ,  ,    CIWA:    COWS:     Musculoskeletal: Strength & Muscle Tone: within normal limits Gait & Station: normal Patient leans: N/A  Psychiatric Specialty Exam:  Presentation  General Appearance:  Disheveled  Eye Contact: Fair  Speech: Clear and Coherent; Slow  Speech Volume:  Decreased  Handedness: Right   Mood and Affect  Mood: Anxious; Depressed  Affect: Congruent   Thought Process  Thought Processes: Coherent; Goal Directed  Descriptions of Associations:Intact  Orientation:Partial  Thought Content:Rumination  History of Schizophrenia/Schizoaffective disorder:Yes  Duration of Psychotic Symptoms:Greater than six months  Hallucinations:Hallucinations: None   Ideas of Reference:None  Suicidal Thoughts:Suicidal Thoughts: No   Homicidal Thoughts:Homicidal Thoughts: No    Sensorium  Memory: Immediate Fair; Recent Poor; Remote Poor  Judgment: Impaired  Insight: Lacking   Executive Functions  Concentration: Fair  Attention Span: Fair  Recall: Poor  Fund of Knowledge: Poor  Language: Good   Psychomotor Activity  Psychomotor Activity: Psychomotor Activity: Normal    Assets  Assets: Communication Skills; Desire for Improvement; Financial Resources/Insurance; Resilience   Sleep  Sleep: Sleep: Good Number of Hours of Sleep: 8   Physical Exam: Physical Exam Vitals and nursing note reviewed.  HENT:     Nose: Nose normal.     Mouth/Throat:     Pharynx: Oropharynx is clear.  Eyes:     Pupils: Pupils are equal, round, and reactive to light.  Cardiovascular:     Rate and Rhythm: Normal rate.     Pulses: Normal pulses.     Comments: Elevated pulse rate (114). Pulmonary:     Effort: Pulmonary effort is normal.  Genitourinary:    Comments: Deferred Musculoskeletal:        General: Normal range of motion.     Cervical back: Normal range of motion.  Skin:    General: Skin is warm and dry.  Neurological:      General: No focal deficit present.     Mental Status: She is alert and oriented to person, place, and time.    Review of Systems  Constitutional:  Negative for chills, diaphoresis and fever.  HENT:  Negative for congestion and sore throat.   Respiratory:  Negative for cough, shortness of breath and wheezing.   Cardiovascular:  Negative for chest pain and palpitations.  Gastrointestinal:  Negative for abdominal pain, diarrhea, heartburn, nausea and vomiting.  Musculoskeletal:  Negative for myalgias and neck pain.  Skin:  Negative for itching and rash.  Neurological:  Negative for dizziness, tingling, tremors, sensory change, speech change, focal weakness, seizures, loss of consciousness, weakness and headaches.  Psychiatric/Behavioral:  Positive for depression. Negative for hallucinations, memory loss, substance abuse (Hx cocaine use disorder) and suicidal ideas. The patient is nervous/anxious. The patient does not have insomnia.    Blood pressure (!) 89/78, pulse (!) 112, temperature 98.3 F (36.8 C), temperature source Oral, resp. rate 14, height 4\' 11"  (1.499 m), weight 63 kg, SpO2 100 %. Body mass index is 28.05 kg/m.  Treatment Plan Summary: Daily contact with patient to assess and evaluate symptoms and progress in treatment and Medication management.   Continue inpatient hospitalization.  Will continue today 12/23/2022 plan as below except where it is noted.   Principal/active diagnoses:  Schizoaffective disorder, bipolar type.    Associated symptoms.  Angry outburst. Suicidal ideations.  Crying spell.  Irritability.  Plan: Re-initiated:  -Continue Buspar 15 mg po bid for anxiety.  -Continue paliperidone 3 mg po daily for mood control.  -Continue Sertraline 50 mg po Q daily for depression. -Continue Trazodone 100 mg po Q bedtime prn for insomnia.  -Continue Nicoderm topically Q 24 hrs for nicotine withdrawal management.   Agitation protocols: Cont as recommended;   -Benadryl 50 mg po or IM tid prn. -Haldol 5 mg po or IM  tid prn.  -Lorazepam 2 mg po or IM tid prn.   Other PRNS -Continue Tylenol 650 mg every 6 hours PRN for mild pain -Continue Maalox 30 ml Q 4 hrs PRN for indigestion -Continue MOM 30 ml po Q 6 hrs for constipation   Safety and Monitoring: Voluntary admission to inpatient psychiatric unit for safety, stabilization and treatment Daily contact with patient to assess and evaluate symptoms and progress in treatment Patient's case to be discussed in multi-disciplinary team meeting Observation Level : q15 minute checks Vital signs: q12 hours Precautions: Safety   Discharge Planning: Social work and case management to assist with discharge planning and identification of hospital follow-up needs prior to discharge Estimated LOS: 5-7 days Discharge Concerns: Need to establish a safety plan; Medication compliance and effectiveness Discharge Goals: Return home with outpatient referrals for mental health follow-up including medication management/psychotherapy  Armandina Stammer, NP, pmhnp, fnp-bc. 12/23/2022, 2:13 PMPatient ID: Yolanda [name], female   DOB: Nov 17, 1975, 47 y.o.   MRN: [id number] Patient ID: Yolanda [name], female   DOB: 1976/04/29, 47 y.o.   MRN: [id number]

## 2022-12-23 NOTE — Group Note (Signed)
[facility] Group Notes: (Clinical Social Work)   12/23/2022      Type of Therapy:  Group Therapy   Participation Level:  Did Not Attend - was invited both individually by MHT and by overhead announcement, chose not to attend.   Ambrose Mantle, LCSW 12/23/2022, 2:13 PM

## 2022-12-23 NOTE — [facility] Group Notes (Signed)
Adult Psychoeducational Group Note  Date:  12/23/2022 Time:  9:30 PM  Group Topic/Focus:  Wrap-Up Group:   The focus of this group is to help patients review their daily goal of treatment and discuss progress on daily workbooks.  Participation Level:  Did Not Attend  Participation Quality:   Did Not Attend  Affect:   Did Not Attend  Cognitive:   Did Not Attend  Insight: None  Engagement in Group:   Did Not Attend  Modes of Intervention:   Did Not Attend  Additional Comments:  Pt Did Not Attend Group  Maura Crandall Cassandra 12/23/2022, 9:30 PM

## 2022-12-23 NOTE — [facility] Group Notes (Signed)
[facility] Group Notes:  (Nursing/MHT/Case Management/Adjunct)  Date:  12/23/2022  Time: 1000  Type of Therapy:  Psychoeducational Skills  Participation Level:  did not attend.   Participation Quality:    Affect:    Cognitive:   Insight  Engagement in Group:    Modes of Intervention:  Discussion, Education, and Exploration  Summary of Progress/Problems: Patients were given two poems to read, one titled ''watch your thoughts for they become your actions'' and another '' the owl and the chimpanzee'' by Jo Camacho. Patients were given education on positive reframing and asked to identify one negative belief about themselves they would like to heal from to impact positive mental well being. Discussion, education and education of healthy coping skills were also discussed in group. Pt did not attend.   Deseri Loss 12/23/2022, 11:11 AM 

## 2022-12-24 DIAGNOSIS — F250 Schizoaffective disorder, bipolar type: Secondary | ICD-10-CM | POA: Diagnosis not present

## 2022-12-24 NOTE — Progress Notes (Signed)
   12/24/22 1603  Psych Admission Type (Psych Patients Only)  Admission Status Involuntary  Psychosocial Assessment  Patient Complaints Sadness  Eye Contact Fair  Facial Expression Anxious  Affect Sad  Speech Soft  Interaction Minimal;No initiation;Childlike  Motor Activity Slow;Tremors  Appearance/Hygiene Poor hygiene;Disheveled  Behavior Characteristics Cooperative;Calm  Mood Pleasant  Thought Process  Content WDL  Delusions None reported or observed  Perception WDL  Hallucination None reported or observed  Judgment Limited  Confusion None  Danger to Self  Current suicidal ideation? Denies  Self-Injurious Behavior No self-injurious ideation or behavior indicators observed or expressed   Agreement Not to Harm Self Yes  Description of Agreement verbal  Danger to Others  Danger to Others None reported or observed

## 2022-12-24 NOTE — [facility] Group Notes (Signed)
Spiritual care group on grief and loss facilitated by Chaplain Katy Shaindel Sweeten, [facility]  Group Goal: Support / Education around grief and loss  Members engage in facilitated group support and psycho-social education.  Group Description:  Following introductions and group rules, group members engaged in facilitated group dialogue and support around topic of loss, with particular support around experiences of loss in their lives. Group Identified types of loss (relationships / self / things) and identified patterns, circumstances, and changes that precipitate losses. Reflected on thoughts / feelings around loss, normalized grief responses, and recognized variety in grief experience. Group encouraged individual reflection on safe space and on the coping skills that they are already utilizing.  Group drew on Adlerian / Rogerian and narrative framework  Patient Progress: Did not attend.  

## 2022-12-24 NOTE — Group Note (Signed)
Recreation Therapy Group Note   Group Topic:Team Building  Group Date: 12/24/2022 Start Time: 0935 End Time: 1006 Facilitators: Mikele Sifuentes-McCall, LRT,CTRS Location: [address] Dayroom   Goal Area(s) Addresses:  Patient will effectively work with peer towards shared goal.  Patient will identify skills used to make activity successful.  Patient will identify how skills used during activity can be applied to reach post d/c goals.    Group Description: [employer]. In teams of 5-6, patients were given 11 craft pipe cleaners. Using the materials provided, patients were instructed to compete again the opposing team(s) to build the tallest free-standing structure from floor level. The activity was timed; difficulty increased by Clinical research associate as Production designer, theatre/television/film continued.  Systematically resources were removed with additional directions for example, placing one arm behind their back, working in silence, and shape stipulations. LRT facilitated post-activity discussion reviewing team processes and necessary communication skills involved in completion. Patients were encouraged to reflect how the skills utilized, or not utilized, in this activity can be incorporated to positively impact support systems post discharge.   Affect/Mood: N/A   Participation Level: Did not attend    Clinical Observations/Individualized Feedback:     Plan: Continue to engage patient in RT group sessions 2-3x/week.   Masyn Fullam-McCall, LRT,CTRS 12/24/2022 11:37 AM

## 2022-12-24 NOTE — Progress Notes (Signed)
   12/24/22 0530  15 Minute Checks  Location Bedroom  Visual Appearance Calm  Behavior Sleeping  Sleep (Behavioral Health Patients Only)  Calculate sleep? (Click Yes once per 24 hr at 0600 safety check) Yes  Documented sleep last 24 hours 9.25

## 2022-12-24 NOTE — Progress Notes (Signed)
[facility] MD Progress Note  12/24/2022 12:54 PM Yolanda [name]  MRN:  [id number]  Reason for admission: This is the first psychiatric admission in this [facility] in 12 years for this 13 AA female with an extensive hx of mental illnesses & probable polysubstance use disorders. She is admitted to the [facility] from the [facility] hospital with complain of worsening suicidal ideations with plan to stab herself. Per chart review, patient apparently reported at the ED that she has been depressed for a while & has not been taking her mental health medications. After medical evaluation.clearance, she was transferred to the [facility] for further psychiatric evaluation/treatments.   Yesterday the psychiatry team made the following recommendations:- -Continue Buspar 15 mg po bid for anxiety.  -Continue paliperidone 3 mg po daily for mood control.  -Continue Sertraline 50 mg po Q daily for depression. -Continue Trazodone 100 mg po Q bedtime prn for insomnia.  -Continue Nicoderm topically Q 24 hrs for nicotine withdrawal management.   On assessment today, the pt reports that their mood is improving Reports that anxiety is reduced, rates anxiety as #3/10, with 10 being high severity Sleep is good, reports sleeping 10 hours last night Appetite is better, eating all her meals Concentration is improving and able to focus on the examination Energy level is moderate Denies suicidal thoughts.  Denies suicidal intent and plan.  Denies having any HI.  Denies having psychotic symptoms.   Denies having side effects to current psychiatric medications.   We discussed compliance to current medication regimen.  Discussed the following psychosocial stressors: Attending and participating in group activities and therapeutic milieu.  Going to the dining room with the patient group forl meals  Principal Problem: Schizoaffective disorder (HCC)  Diagnosis: Principal Problem:   Schizoaffective disorder (HCC) Active Problems:   MDD (major  depressive disorder)  Total Time spent with patient:  30 minute  Past Psychiatric History: See H&P.  Past Medical History:  Past Medical History:  Diagnosis Date   Bipolar affect, depressed (HCC)    Depression     Past Surgical History:  Procedure Laterality Date   NO PAST SURGERIES     SALPINGECTOMY     Family History: History reviewed. No pertinent family history.  Family Psychiatric  History: See H&P.  Social History:  Social History   Substance and Sexual Activity  Alcohol Use Yes     Social History   Substance and Sexual Activity  Drug Use Yes   Types: Cocaine, Marijuana    Social History   Socioeconomic History   Marital status: Single    Spouse name: Not on file   Number of children: Not on file   Years of education: Not on file   Highest education level: Not on file  Occupational History   Not on file  Tobacco Use   Smoking status: Every Day   Smokeless tobacco: Not on file  Substance and Sexual Activity   Alcohol use: Yes   Drug use: Yes    Types: Cocaine, Marijuana   Sexual activity: Yes  Other Topics Concern   Not on file  Social History Narrative   Not on file   Social Determinants of Health   Financial Resource Strain: Not on file  Food Insecurity: Patient Declined (12/20/2022)   Hunger Vital Sign    Worried About Running Out of Food in the Last Year: Patient declined    Ran Out of Food in the Last Year: Patient declined  Transportation Needs: No Transportation Needs (  12/20/2022)   PRAPARE - Administrator, Civil Service (Medical): No    Lack of Transportation (Non-Medical): No  Physical Activity: Not on file  Stress: Not on file  Social Connections: Not on file   Additional Social History:   Sleep: Good  Appetite:  Good  Current Medications: Current Facility-Administered Medications  Medication Dose Route Frequency Provider Last Rate Last Admin   acetaminophen (TYLENOL) tablet 650 mg  650 mg Oral Q6H PRN [name], NP       alum & mag hydroxide-simeth (MAALOX/MYLANTA) 200-200-20 MG/5ML suspension 30 mL  30 mL Oral Q4H PRN [name], NP       busPIRone (BUSPAR) tablet 15 mg  15 mg Oral BID [name], NP   15 mg at 12/24/22 0820   diphenhydrAMINE (BENADRYL) capsule 50 mg  50 mg Oral TID PRN [name], NP       Or   diphenhydrAMINE (BENADRYL) injection 50 mg  50 mg Intramuscular TID PRN [name], NP       haloperidol (HALDOL) tablet 5 mg  5 mg Oral TID PRN [name], NP       Or   haloperidol lactate (HALDOL) injection 5 mg  5 mg Intramuscular TID PRN [name], NP       LORazepam (ATIVAN) tablet 2 mg  2 mg Oral TID PRN [name], NP       Or   LORazepam (ATIVAN) injection 2 mg  2 mg Intramuscular TID PRN [name], NP       magnesium hydroxide (MILK OF MAGNESIA) suspension 30 mL  30 mL Oral Daily PRN [name], NP       nicotine (NICODERM CQ - dosed in mg/24 hours) patch 14 mg  14 mg Transdermal Daily [name], NP       paliperidone (INVEGA) 24 hr tablet 3 mg  3 mg Oral Daily Nwoko, Agnes I, NP   3 mg at 12/23/22 0826   pantoprazole (PROTONIX) EC tablet 40 mg  40 mg Oral Daily [name], NP   40 mg at 12/24/22 0820   sertraline (ZOLOFT) tablet 50 mg  50 mg Oral Daily Nwoko, Nicole Kindred I, NP   50 mg at 12/24/22 0820   traZODone (DESYREL) tablet 100 mg  100 mg Oral QHS PRN [name], NP       Lab Results:  No results found for this or any previous visit (from the past 48 hour(s)).  Blood Alcohol level:  Lab Results  Component Value Date   ETH <10 12/19/2022   ETH <10 11/21/2019   Metabolic Disorder Labs: Lab Results  Component Value Date   HGBA1C 4.4 (L) 12/21/2022   MPG 79.58 12/21/2022   No results found for: "PROLACTIN" Lab Results  Component Value Date   CHOL 218 (H) 12/21/2022   TRIG 81 12/21/2022   HDL 53 12/21/2022   CHOLHDL 4.1 12/21/2022   VLDL 16 12/21/2022   LDLCALC 149 (H) 12/21/2022   LDLCALC 110 (H) 03/13/2011    Physical Findings: AIMS:  , ,  ,  ,    CIWA:    COWS:     Musculoskeletal: Strength & Muscle Tone: within normal limits Gait & Station: normal Patient leans: N/A  Psychiatric Specialty Exam:  Presentation  General Appearance:  Disheveled  Eye Contact: Fair  Speech: Clear and Coherent; Slow  Speech Volume: Decreased  Handedness: Right  Mood and Affect  Mood: Anxious; Depressed  Affect: Congruent  Thought Process  Thought Processes: Coherent; Goal Directed  Descriptions of Associations:Intact  Orientation:Partial  Thought Content:Rumination  History of Schizophrenia/Schizoaffective disorder:Yes  Duration of Psychotic Symptoms:Greater than six months  Hallucinations:Hallucinations: None  Ideas of Reference:None  Suicidal Thoughts:Suicidal Thoughts: No  Homicidal Thoughts:Homicidal Thoughts: No  Sensorium  Memory: Immediate Fair; Recent Poor; Remote Poor  Judgment: Impaired  Insight: Lacking   Executive Functions  Concentration: Fair  Attention Span: Fair  Recall: Poor  Fund of Knowledge: Poor  Language: Good  Psychomotor Activity  Psychomotor Activity: Psychomotor Activity: Normal   Assets  Assets: Communication Skills; Desire for Improvement; Financial Resources/Insurance; Resilience  Sleep  Sleep: Sleep: Good Number of Hours of Sleep: 8   Physical Exam: Physical Exam Vitals and nursing note reviewed.  HENT:     Nose: Nose normal.     Mouth/Throat:     Pharynx: Oropharynx is clear.  Eyes:     Pupils: Pupils are equal, round, and reactive to light.  Cardiovascular:     Rate and Rhythm: Normal rate.     Pulses: Normal pulses.     Comments: Elevated pulse rate (114). Pulmonary:     Effort: Pulmonary effort is normal.  Genitourinary:    Comments: Deferred Musculoskeletal:        General: Normal range of motion.     Cervical back: Normal range of motion.  Skin:    General: Skin is warm and dry.   Neurological:     General: No focal deficit present.     Mental Status: She is alert and oriented to person, place, and time.  Psychiatric:        Behavior: Behavior normal.    Review of Systems  Constitutional:  Negative for chills, diaphoresis and fever.  HENT:  Negative for congestion and sore throat.   Respiratory:  Negative for cough, shortness of breath and wheezing.   Cardiovascular:  Negative for chest pain and palpitations.  Gastrointestinal:  Negative for abdominal pain, diarrhea, heartburn, nausea and vomiting.  Musculoskeletal:  Negative for myalgias and neck pain.  Skin:  Negative for itching and rash.  Neurological:  Negative for dizziness, tingling, tremors, sensory change, speech change, focal weakness, seizures, loss of consciousness, weakness and headaches.  Psychiatric/Behavioral:  Positive for depression. Negative for hallucinations, memory loss, substance abuse (Hx cocaine use disorder) and suicidal ideas. The patient is nervous/anxious. The patient does not have insomnia.    Blood pressure 107/77, pulse (!) 105, temperature 97.8 F (36.6 C), temperature source Oral, resp. rate 16, height 4\' 11"  (1.499 m), weight 63 kg, SpO2 100 %. Body mass index is 28.05 kg/m.  Treatment Plan Summary: Daily contact with patient to assess and evaluate symptoms and progress in treatment and Medication management.   Continue inpatient hospitalization.  Will continue today 12/24/2022 plan as below except where it is noted.   Principal/active diagnoses:  Schizoaffective disorder, bipolar type.    Associated symptoms.  Angry outburst. Suicidal ideations.  Crying spell.  Irritability.  Plan: Re-initiated:  -Continue Buspar 15 mg po bid for anxiety.  -Continue paliperidone 3 mg po daily for mood control.  -Continue Sertraline 50 mg po Q daily for depression. -Continue Trazodone 100 mg po Q bedtime prn for insomnia.  -Continue Nicoderm topically Q 24 hrs for nicotine  withdrawal management.   Agitation protocols: Cont as recommended;  -Benadryl 50 mg po or IM tid prn. -Haldol 5 mg po or IM tid prn.  -Lorazepam 2 mg po or IM tid prn.  Other PRNS -Continue Tylenol 650 mg every 6 hours PRN for mild pain -Continue Maalox 30 ml Q 4 hrs PRN for indigestion -Continue MOM 30 ml po Q 6 hrs for constipation   Safety and Monitoring: Voluntary admission to inpatient psychiatric unit for safety, stabilization and treatment Daily contact with patient to assess and evaluate symptoms and progress in treatment Patient's case to be discussed in multi-disciplinary team meeting Observation Level : q15 minute checks Vital signs: q12 hours Precautions: Safety   Discharge Planning: Social work and case management to assist with discharge planning and identification of hospital follow-up needs prior to discharge Estimated LOS: 5-7 days Discharge Concerns: Need to establish a safety plan; Medication compliance and effectiveness Discharge Goals: Return home with outpatient referrals for mental health follow-up including medication management/psychotherapy  Cecilie Lowers, FNP, pmhnp, fnp-bc. 12/24/2022, 12:54 PMPatient ID: Larey Brick, female   DOB: 1976-03-27, 47 y.o.   MRN: [id number] Patient ID: ROMAINE KEMMER, female   DOB: 04-22-1976, 47 y.o.   MRN: [id number] Patient ID: ZAYDEN COCROFT, female   DOB: 1976-02-09, 47 y.o.   MRN: [id number]

## 2022-12-24 NOTE — Progress Notes (Signed)
  During this shift, patient isolated to her room, only coming out for medication and personal requests.  Writer provided patient with "word searches" in which she states is her favorite thing to do.  Patient is pleasant and denies SI/HI/AVH.  Patient denies pain.  Scheduled medication administered.  Patient is safe on the unit with q15 minute safety checks.

## 2022-12-25 DIAGNOSIS — F250 Schizoaffective disorder, bipolar type: Secondary | ICD-10-CM | POA: Diagnosis not present

## 2022-12-25 NOTE — Plan of Care (Signed)
  Problem: Coping: Goal: Coping ability will improve Outcome: Progressing   Problem: Self-Concept: Goal: Level of anxiety will decrease Outcome: Progressing   

## 2022-12-25 NOTE — Progress Notes (Signed)
   12/25/22 1610  15 Minute Checks  Location Bedroom  Visual Appearance Calm  Behavior Sleeping  Sleep (Behavioral Health Patients Only)  Calculate sleep? (Click Yes once per 24 hr at 0600 safety check) Yes  Documented sleep last 24 hours 10.25

## 2022-12-25 NOTE — [facility] Group Notes (Signed)
Psychoeducational Group Note  Date:  12/25/2022 Time:  2000  Group Topic/Focus:  Wrap up group  Participation Level: Did Not Attend  Participation Quality:  Not Applicable  Affect:  Not Applicable  Cognitive:  Not Applicable  Insight:  Not Applicable  Engagement in Group: Not Applicable  Additional Comments:  Did not attend.   [name] 12/25/2022, 9:11 PM

## 2022-12-25 NOTE — Progress Notes (Signed)
Patient ID: Yolanda [name], female   DOB: [birth year], 47 y.o.   MRN: [id number] [facility] MD Progress Note  12/25/2022 3:06 PM Lyndi IVYROSE GIERKE  MRN:  [id number]  Reason for admission: This is the first psychiatric admission in this [facility] in 12 years for this 35 AA female with an extensive hx of mental illnesses & probable polysubstance use disorders. She is admitted to the [facility] from the [facility] hospital with complain of worsening suicidal ideations with plan to stab herself. Per chart review, patient apparently reported at the ED that she has been depressed for a while & has not been taking her mental health medications. After medical evaluation.clearance, she was transferred to the [facility] for further psychiatric evaluation/treatments.   Yesterday the psychiatry team made the following recommendations:- -Continue Buspar 15 mg po bid for anxiety.  -Continue paliperidone 3 mg po daily for mood control.  -Continue Sertraline 50 mg po Q daily for depression. -Continue Trazodone 100 mg po Q bedtime prn for insomnia.  -Continue Nicoderm topically Q 24 hrs for nicotine withdrawal management.   On assessment today, the pt reports that their mood is improving, but labile with teary-eyed, commenting that none of her children wants to take into their home. Reports that anxiety is reduced, rates anxiety as #2/10, with 10 being high severity Sleep is good, reports sleeping 10 hours last night Appetite is better, eating all her meals Concentration is improving and able to focus on the examination Energy level is moderate Denies suicidal thoughts.  Denies suicidal intent and plan.  Denies having any HI.  Denies having psychotic symptoms.   Denies having side effects to current psychiatric medications.   We discussed compliance to current medication regimen.  Discussed the following psychosocial stressors: Attending and participating in group activities and therapeutic milieu.  Going to the dining room with  the patient group forl meals  Principal Problem: Schizoaffective disorder (HCC)  Diagnosis: Principal Problem:   Schizoaffective disorder (HCC) Active Problems:   MDD (major depressive disorder)  Total Time spent with patient:  30 minute  Past Psychiatric History: See H&P.  Past Medical History:  Past Medical History:  Diagnosis Date   Bipolar affect, depressed (HCC)    Depression     Past Surgical History:  Procedure Laterality Date   NO PAST SURGERIES     SALPINGECTOMY     Family History: History reviewed. No pertinent family history.  Family Psychiatric  History: See H&P.  Social History:  Social History   Substance and Sexual Activity  Alcohol Use Yes     Social History   Substance and Sexual Activity  Drug Use Yes   Types: Cocaine, Marijuana    Social History   Socioeconomic History   Marital status: Single    Spouse name: Not on file   Number of children: Not on file   Years of education: Not on file   Highest education level: Not on file  Occupational History   Not on file  Tobacco Use   Smoking status: Every Day   Smokeless tobacco: Not on file  Substance and Sexual Activity   Alcohol use: Yes   Drug use: Yes    Types: Cocaine, Marijuana   Sexual activity: Yes  Other Topics Concern   Not on file  Social History Narrative   Not on file   Social Determinants of Health   Financial Resource Strain: Not on file  Food Insecurity: Patient Declined (12/20/2022)   Hunger Vital Sign  Worried About Programme researcher, broadcasting/film/video in the Last Year: Patient declined    Barista in the Last Year: Patient declined  Transportation Needs: No Transportation Needs (12/20/2022)   PRAPARE - Administrator, Civil Service (Medical): No    Lack of Transportation (Non-Medical): No  Physical Activity: Not on file  Stress: Not on file  Social Connections: Not on file   Additional Social History:   Sleep: Good  Appetite:  Good  Current  Medications: Current Facility-Administered Medications  Medication Dose Route Frequency Provider Last Rate Last Admin   acetaminophen (TYLENOL) tablet 650 mg  650 mg Oral Q6H PRN [name], NP       alum & mag hydroxide-simeth (MAALOX/MYLANTA) 200-200-20 MG/5ML suspension 30 mL  30 mL Oral Q4H PRN [name], NP       busPIRone (BUSPAR) tablet 15 mg  15 mg Oral BID [name], NP   15 mg at 12/25/22 0755   diphenhydrAMINE (BENADRYL) capsule 50 mg  50 mg Oral TID PRN [name], NP       Or   diphenhydrAMINE (BENADRYL) injection 50 mg  50 mg Intramuscular TID PRN [name], NP       haloperidol (HALDOL) tablet 5 mg  5 mg Oral TID PRN [name], NP       Or   haloperidol lactate (HALDOL) injection 5 mg  5 mg Intramuscular TID PRN [name], NP       LORazepam (ATIVAN) tablet 2 mg  2 mg Oral TID PRN [name], NP       Or   LORazepam (ATIVAN) injection 2 mg  2 mg Intramuscular TID PRN [name], NP       magnesium hydroxide (MILK OF MAGNESIA) suspension 30 mL  30 mL Oral Daily PRN [name], NP       nicotine (NICODERM CQ - dosed in mg/24 hours) patch 14 mg  14 mg Transdermal Daily [name], NP       paliperidone (INVEGA) 24 hr tablet 3 mg  3 mg Oral Daily Nwoko, Agnes I, NP   3 mg at 12/25/22 0755   pantoprazole (PROTONIX) EC tablet 40 mg  40 mg Oral Daily [name], NP   40 mg at 12/25/22 0755   sertraline (ZOLOFT) tablet 50 mg  50 mg Oral Daily [name], NP   50 mg at 12/25/22 0755   traZODone (DESYREL) tablet 100 mg  100 mg Oral QHS PRN [name], NP       Lab Results:  No results found for this or any previous visit (from the past 48 hour(s)).  Blood Alcohol level:  Lab Results  Component Value Date   ETH <10 12/19/2022   ETH <10 11/21/2019   Metabolic Disorder Labs: Lab Results  Component Value Date   HGBA1C 4.4 (L) 12/21/2022   MPG 79.58 12/21/2022   No results found for: "PROLACTIN" Lab Results  Component Value  Date   CHOL 218 (H) 12/21/2022   TRIG 81 12/21/2022   HDL 53 12/21/2022   CHOLHDL 4.1 12/21/2022   VLDL 16 12/21/2022   LDLCALC 149 (H) 12/21/2022   LDLCALC 110 (H) 03/13/2011   Physical Findings: AIMS:  , ,  ,  ,    CIWA:    COWS:     Musculoskeletal: Strength & Muscle Tone: within normal limits Gait & Station: normal Patient leans: N/A  Psychiatric Specialty Exam:  Presentation  General Appearance:  Disheveled  Eye Contact: Fair  Speech: Slow; Clear and Coherent  Speech Volume: Decreased  Handedness: Right  Mood and Affect  Mood: Anxious; Depressed; Labile  Affect: Congruent  Thought Process  Thought Processes: Coherent; Goal Directed; Linear  Descriptions of Associations:Intact  Orientation:Partial  Thought Content:Rumination  History of Schizophrenia/Schizoaffective disorder:Yes  Duration of Psychotic Symptoms:Greater than six months  Hallucinations:Hallucinations: None  Ideas of Reference:None  Suicidal Thoughts:Suicidal Thoughts: No  Homicidal Thoughts:Homicidal Thoughts: No  Sensorium  Memory: Immediate Fair; Recent Fair  Judgment: Impaired  Insight: Shallow   Executive Functions  Concentration: Fair  Attention Span: Fair  Recall: [employer] of Knowledge: Fair  Language: Fair  Psychomotor Activity  Psychomotor Activity: Psychomotor Activity: Normal   Assets  Assets: Communication Skills; Desire for Improvement; Physical Health  Sleep  Sleep: Sleep: Good Number of Hours of Sleep: 10.25   Physical Exam: Physical Exam Vitals and nursing note reviewed.  HENT:     Nose: Nose normal.     Mouth/Throat:     Pharynx: Oropharynx is clear.  Eyes:     Pupils: Pupils are equal, round, and reactive to light.  Cardiovascular:     Rate and Rhythm: Tachycardia present.     Comments: Elevated pulse rate (114). Pulmonary:     Effort: Pulmonary effort is normal.  Genitourinary:    Comments:  Deferred Musculoskeletal:        General: Normal range of motion.     Cervical back: Normal range of motion.  Skin:    General: Skin is warm and dry.  Neurological:     General: No focal deficit present.     Mental Status: She is alert and oriented to person, place, and time.  Psychiatric:        Behavior: Behavior normal.    Review of Systems  Constitutional:  Negative for chills, diaphoresis and fever.  HENT:  Negative for congestion and sore throat.   Respiratory:  Negative for cough, shortness of breath and wheezing.   Cardiovascular:  Negative for chest pain and palpitations.  Gastrointestinal:  Negative for abdominal pain, diarrhea, heartburn, nausea and vomiting.  Musculoskeletal:  Negative for myalgias and neck pain.  Skin:  Negative for itching and rash.  Neurological:  Negative for dizziness, tingling, tremors, sensory change, speech change, focal weakness, seizures, loss of consciousness, weakness and headaches.  Psychiatric/Behavioral:  Positive for depression. Negative for hallucinations, memory loss, substance abuse (Hx cocaine use disorder) and suicidal ideas. The patient is nervous/anxious. The patient does not have insomnia.    Blood pressure 109/86, pulse (!) 109, temperature 97.8 F (36.6 C), temperature source Oral, resp. rate 16, height 4\' 11"  (1.499 m), weight 63 kg, SpO2 100 %. Body mass index is 28.05 kg/m.  Treatment Plan Summary: Daily contact with patient to assess and evaluate symptoms and progress in treatment and Medication management.   Continue inpatient hospitalization.  Will continue today 12/25/2022 plan as below except where it is noted.   Principal/active diagnoses:  Schizoaffective disorder, bipolar type.    Associated symptoms.  Angry outburst. Suicidal ideations.  Crying spell.  Irritability.  Plan: Re-initiated:  -Continue Buspar 15 mg po bid for anxiety.  -Continue paliperidone 3 mg po daily for mood control.  -Continue Sertraline  50 mg po Q daily for depression. -Continue Trazodone 100 mg po Q bedtime prn for insomnia.  -Continue Nicoderm topically Q 24 hrs for nicotine withdrawal management.   Agitation protocols: Cont as recommended;  -Benadryl 50 mg  po or IM tid prn. -Haldol 5 mg po or IM tid prn.  -Lorazepam 2 mg po or IM tid prn.   Other PRNS -Continue Tylenol 650 mg every 6 hours PRN for mild pain -Continue Maalox 30 ml Q 4 hrs PRN for indigestion -Continue MOM 30 ml po Q 6 hrs for constipation   Safety and Monitoring: Voluntary admission to inpatient psychiatric unit for safety, stabilization and treatment Daily contact with patient to assess and evaluate symptoms and progress in treatment Patient's case to be discussed in multi-disciplinary team meeting Observation Level : q15 minute checks Vital signs: q12 hours Precautions: Safety   Discharge Planning: Social work and case management to assist with discharge planning and identification of hospital follow-up needs prior to discharge Estimated LOS: 5-7 days Discharge Concerns: Need to establish a safety plan; Medication compliance and effectiveness Discharge Goals: Return home with outpatient referrals for mental health follow-up including medication management/psychotherapy  Cecilie Lowers, FNP, pmhnp, fnp-bc. 12/25/2022, 3:06 PMPatient ID: Larey Brick, female   DOB: 08-Sep-1975, 47 y.o.   MRN: [id number] Patient ID: RONNI CUBERO, female   DOB: [birth year], 47 y.o.   MRN: [id number] Patient ID: BRIGGITTE ROSENBALM, female   DOB: January 06, 1976, 47 y.o.   MRN: [id number]

## 2022-12-25 NOTE — Progress Notes (Signed)
   12/25/22 1000  Psych Admission Type (Psych Patients Only)  Admission Status Involuntary  Psychosocial Assessment  Patient Complaints None  Eye Contact Fair  Facial Expression Flat  Affect Sad  Speech Soft  Interaction Minimal  Motor Activity Slow;Tremors  Appearance/Hygiene Disheveled  Behavior Characteristics Cooperative;Calm  Mood Pleasant  Thought Process  Coherency WDL  Content WDL  Delusions None reported or observed  Perception WDL  Hallucination None reported or observed  Judgment Limited  Confusion None  Danger to Self  Current suicidal ideation? Denies  Self-Injurious Behavior No self-injurious ideation or behavior indicators observed or expressed   Agreement Not to Harm Self Yes  Description of Agreement verbal  Danger to Others  Danger to Others None reported or observed

## 2022-12-25 NOTE — Group Note (Signed)
Date:  12/25/2022 Time:  12:08 PM  Group Topic/Focus:  Rediscovering Joy:   The focus of this group is to explore various ways to relieve stress in a positive manner.    Participation Level:  Did Not Attend  Participation Quality:    Affect:    Cognitive:    Insight:   Engagement in Group:    Modes of Intervention:    Additional Comments:    Memory Dance Laticia Vannostrand 12/25/2022, 12:08 PM

## 2022-12-25 NOTE — Progress Notes (Signed)
   12/24/22 2300  Psych Admission Type (Psych Patients Only)  Admission Status Involuntary  Psychosocial Assessment  Patient Complaints Depression;Sadness  Eye Contact Fair  Facial Expression Flat  Affect Sad  Speech Soft  Interaction Minimal  Motor Activity Slow;Tremors  Appearance/Hygiene Disheveled  Behavior Characteristics Cooperative;Calm  Mood Pleasant  Thought Process  Coherency WDL  Content WDL  Delusions None reported or observed  Perception WDL  Hallucination None reported or observed  Judgment Limited  Confusion None  Danger to Self  Current suicidal ideation? Denies  Self-Injurious Behavior No self-injurious ideation or behavior indicators observed or expressed   Agreement Not to Harm Self Yes  Description of Agreement verbal  Danger to Others  Danger to Others None reported or observed

## 2022-12-25 NOTE — Group Note (Signed)
Recreation Therapy Group Note   Group Topic:Animal Assisted Therapy   Group Date: 12/25/2022 Start Time: 0950 End Time: 1030 Facilitators: Deborah Dondero-McCall, LRT,CTRS Location: [address] Dayroom   Animal-Assisted Activity (AAA) Program Checklist/Progress Notes Patient Eligibility Criteria Checklist & Daily Group note for Rec Tx Intervention  AAA/T Program Assumption of Risk Form signed by Patient/ or Parent Legal Guardian Yes  Patient understands his/her participation is voluntary Yes   Affect/Mood: N/A   Participation Level: Did not attend    Clinical Observations/Individualized Feedback:     Plan: Continue to engage patient in RT group sessions 2-3x/week.   Bereket Gernert-McCall, LRT,CTRS 12/25/2022 1:06 PM

## 2022-12-26 ENCOUNTER — Encounter (HOSPITAL_COMMUNITY): Payer: Self-pay

## 2022-12-26 DIAGNOSIS — F250 Schizoaffective disorder, bipolar type: Secondary | ICD-10-CM | POA: Diagnosis not present

## 2022-12-26 NOTE — [facility] Group Notes (Signed)
Spiritual care group facilitated by Chaplain Katy Caven Perine, BCC  Group focused on topic of strength. Group members reflected on what thoughts and feelings emerge when they hear this topic. They then engaged in facilitated dialog around how strength is present in their lives. This dialog focused on representing what strength had been to them in their lives (images and patterns given) and what they saw as helpful in their life now (what they needed / wanted).  Activity drew on narrative framework.  Patient Progress: Did not attend.  

## 2022-12-26 NOTE — [facility] Group Notes (Signed)
Adult Psychoeducational Group Note  Date:  12/26/2022 Time:  9:30 PM  Group Topic/Focus:  Wrap-Up Group:   The focus of this group is to help patients review their daily goal of treatment and discuss progress on daily workbooks.  Additional Comments:  Pt did not attend the evening NA meeting.  Christ Kick 12/26/2022, 9:30 PM

## 2022-12-26 NOTE — Progress Notes (Signed)
   12/26/22 0645  15 Minute Checks  Location Bedroom  Visual Appearance Calm  Behavior Composed  Sleep (Behavioral Health Patients Only)  Calculate sleep? (Click Yes once per 24 hr at 0600 safety check) Yes  Documented sleep last 24 hours 10.75

## 2022-12-26 NOTE — [facility] Group Notes (Signed)
[facility] Group Notes:  (Nursing/MHT/Case Management/Adjunct)  Date:  12/26/2022  Time:  11:58 AM  Type of Therapy:  Music Therapy  Participation Level:  Did Not Attend  Summary of Progress/Problems:  Patient was asked to choose a song that was uplifting and positive and then tell why they picked that certain song. Patient did not attend music group today.   Chalmers Iddings R Franki Alcaide 12/26/2022, 11:58 AM 

## 2022-12-26 NOTE — Progress Notes (Cosign Needed Addendum)
Patient ID: Yolanda [name], female   DOB: 1975/07/26, 47 y.o.   MRN: [id number] [facility] MD Progress Note  12/26/2022 11:21 AM Tuyet LIZET LENARD  MRN:  [id number]  Reason for admission: This is the first psychiatric admission in this [facility] in 12 years for this 72 AA female with an extensive hx of mental illnesses & probable polysubstance use disorders. She is admitted to the [facility] from the [facility] hospital with complain of worsening suicidal ideations with plan to stab herself. Per chart review, patient apparently reported at the ED that she has been depressed for a while & has not been taking her mental health medications. After medical evaluation.clearance, she was transferred to the [facility] for further psychiatric evaluation/treatments.   Yesterday the psychiatry team made the following recommendations:-  -Continue Buspar 15 mg po bid for anxiety.  -Continue paliperidone 3 mg po daily for mood control.  -Continue Sertraline 50 mg po Q daily for depression. -Continue Trazodone 100 mg po Q bedtime prn for insomnia.  -Continue Nicoderm topically Q 24 hrs for nicotine withdrawal management.   On assessment today, the pt reports that their mood is improving, but labile with teary-eyed, commenting that none of her children, her mother have friends wants to take into their home. Reports that anxiety symptoms are manageable.  Rates anxiety as #4/10, with 10 being high severity Sleep is good, reports sleeping 8 hours last night Appetite is better, eating all her meals in the cafeteria Concentration is improving and able to focus on the examination Energy level is adequate Denies suicidal thoughts.  Denies suicidal intent and plan.  Denies having any HI.  Denies having psychotic symptoms.   Denies having side effects to current psychiatric medications.   We discussed compliance to current medication regimen.  Discussed the following psychosocial stressors: Attending and participating in group  activities and therapeutic milieu.  Going to the dining room with the patient group forl meals.  LCSW to follow-up with safety plan and resources for patient discharge.  Principal Problem: Schizoaffective disorder (HCC)  Diagnosis: Principal Problem:   Schizoaffective disorder (HCC) Active Problems:   MDD (major depressive disorder)  Total Time spent with patient:  30 minute  Past Psychiatric History: See H&P.  Past Medical History:  Past Medical History:  Diagnosis Date   Bipolar affect, depressed (HCC)    Depression     Past Surgical History:  Procedure Laterality Date   NO PAST SURGERIES     SALPINGECTOMY     Family History: History reviewed. No pertinent family history.  Family Psychiatric  History: See H&P.  Social History:  Social History   Substance and Sexual Activity  Alcohol Use Yes     Social History   Substance and Sexual Activity  Drug Use Yes   Types: Cocaine, Marijuana    Social History   Socioeconomic History   Marital status: Single    Spouse name: Not on file   Number of children: Not on file   Years of education: Not on file   Highest education level: Not on file  Occupational History   Not on file  Tobacco Use   Smoking status: Every Day   Smokeless tobacco: Not on file  Substance and Sexual Activity   Alcohol use: Yes   Drug use: Yes    Types: Cocaine, Marijuana   Sexual activity: Yes  Other Topics Concern   Not on file  Social History Narrative   Not on file   Social Determinants of  Health   Financial Resource Strain: Not on file  Food Insecurity: Patient Declined (12/20/2022)   Hunger Vital Sign    Worried About Running Out of Food in the Last Year: Patient declined    Ran Out of Food in the Last Year: Patient declined  Transportation Needs: No Transportation Needs (12/20/2022)   PRAPARE - Administrator, Civil Service (Medical): No    Lack of Transportation (Non-Medical): No  Physical Activity: Not on file   Stress: Not on file  Social Connections: Not on file   Additional Social History:   Sleep: Good  Appetite:  Good  Current Medications: Current Facility-Administered Medications  Medication Dose Route Frequency Provider Last Rate Last Admin   acetaminophen (TYLENOL) tablet 650 mg  650 mg Oral Q6H PRN [name], NP       alum & mag hydroxide-simeth (MAALOX/MYLANTA) 200-200-20 MG/5ML suspension 30 mL  30 mL Oral Q4H PRN [name], NP       busPIRone (BUSPAR) tablet 15 mg  15 mg Oral BID [name], NP   15 mg at 12/26/22 0830   diphenhydrAMINE (BENADRYL) capsule 50 mg  50 mg Oral TID PRN [name], NP       Or   diphenhydrAMINE (BENADRYL) injection 50 mg  50 mg Intramuscular TID PRN [name], NP       haloperidol (HALDOL) tablet 5 mg  5 mg Oral TID PRN [name], NP       Or   haloperidol lactate (HALDOL) injection 5 mg  5 mg Intramuscular TID PRN [name], NP       LORazepam (ATIVAN) tablet 2 mg  2 mg Oral TID PRN [name], NP       Or   LORazepam (ATIVAN) injection 2 mg  2 mg Intramuscular TID PRN [name], NP       magnesium hydroxide (MILK OF MAGNESIA) suspension 30 mL  30 mL Oral Daily PRN [name], NP       nicotine (NICODERM CQ - dosed in mg/24 hours) patch 14 mg  14 mg Transdermal Daily [name], NP       paliperidone (INVEGA) 24 hr tablet 3 mg  3 mg Oral Daily Nwoko, Agnes I, NP   3 mg at 12/26/22 0831   pantoprazole (PROTONIX) EC tablet 40 mg  40 mg Oral Daily Nwoko, Nicole Kindred I, NP   40 mg at 12/26/22 0830   sertraline (ZOLOFT) tablet 50 mg  50 mg Oral Daily Nwoko, Nicole Kindred I, NP   50 mg at 12/26/22 0830   traZODone (DESYREL) tablet 100 mg  100 mg Oral QHS PRN [name], NP       Lab Results:  No results found for this or any previous visit (from the past 48 hour(s)).  Blood Alcohol level:  Lab Results  Component Value Date   ETH <10 12/19/2022   ETH <10 11/21/2019   Metabolic Disorder Labs: Lab Results  Component  Value Date   HGBA1C 4.4 (L) 12/21/2022   MPG 79.58 12/21/2022   No results found for: "PROLACTIN" Lab Results  Component Value Date   CHOL 218 (H) 12/21/2022   TRIG 81 12/21/2022   HDL 53 12/21/2022   CHOLHDL 4.1 12/21/2022   VLDL 16 12/21/2022   LDLCALC 149 (H) 12/21/2022   LDLCALC 110 (H) 03/13/2011   Physical Findings: AIMS:  , ,  ,  ,    CIWA:    COWS:  Musculoskeletal: Strength & Muscle Tone: within normal limits Gait & Station: normal Patient leans: N/A  Psychiatric Specialty Exam:  Presentation  General Appearance:  Appropriate for Environment; Disheveled  Eye Contact: Fair  Speech: Slow; Clear and Coherent  Speech Volume: Decreased  Handedness: Right  Mood and Affect  Mood: Anxious; Depressed; Labile; Hopeless  Affect: Congruent  Thought Process  Thought Processes: Coherent; Goal Directed; Linear  Descriptions of Associations:Intact  Orientation:Full (Time, Place and Person)  Thought Content:Rumination  History of Schizophrenia/Schizoaffective disorder:Yes  Duration of Psychotic Symptoms:Greater than six months  Hallucinations:Hallucinations: None  Ideas of Reference:None  Suicidal Thoughts:Suicidal Thoughts: No  Homicidal Thoughts:Homicidal Thoughts: No  Sensorium  Memory: Immediate Good  Judgment: Impaired  Insight: Shallow   Executive Functions  Concentration: Fair  Attention Span: Fair  Recall: Fair  Fund of Knowledge: Fair  Language: Fair  Psychomotor Activity  Psychomotor Activity: Psychomotor Activity: Normal   Assets  Assets: Communication Skills; Desire for Improvement; Resilience; Physical Health  Sleep  Sleep: Sleep: Good Number of Hours of Sleep: 10   Physical Exam: Physical Exam Vitals and nursing note reviewed.  HENT:     Nose: Nose normal.     Mouth/Throat:     Pharynx: Oropharynx is clear.  Eyes:     Pupils: Pupils are equal, round, and reactive to light.   Cardiovascular:     Rate and Rhythm: Tachycardia present.     Comments: Elevated pulse rate (114). Pulmonary:     Effort: Pulmonary effort is normal.  Genitourinary:    Comments: Deferred Musculoskeletal:        General: Normal range of motion.     Cervical back: Normal range of motion.  Skin:    General: Skin is warm and dry.  Neurological:     General: No focal deficit present.     Mental Status: She is alert and oriented to person, place, and time.  Psychiatric:        Behavior: Behavior normal.     Comments: Mood is labile and crying due to not having somewhere to go to upon discharge    Review of Systems  Constitutional:  Negative for chills, diaphoresis and fever.  HENT:  Negative for congestion and sore throat.   Respiratory:  Negative for cough, shortness of breath and wheezing.   Cardiovascular:  Negative for chest pain and palpitations.  Gastrointestinal:  Negative for abdominal pain, diarrhea, heartburn, nausea and vomiting.  Musculoskeletal:  Negative for myalgias and neck pain.  Skin:  Negative for itching and rash.  Neurological:  Negative for dizziness, tingling, tremors, sensory change, speech change, focal weakness, seizures, loss of consciousness, weakness and headaches.  Psychiatric/Behavioral:  Positive for depression. Negative for hallucinations, memory loss, substance abuse (Hx cocaine use disorder) and suicidal ideas. The patient is nervous/anxious. The patient does not have insomnia.    Blood pressure 102/72, pulse 84, temperature 98.4 F (36.9 C), temperature source Oral, resp. rate 16, height 4\' 11"  (1.499 m), weight 63 kg, SpO2 100 %. Body mass index is 28.05 kg/m.  Treatment Plan Summary: Daily contact with patient to assess and evaluate symptoms and progress in treatment and Medication management.   Continue inpatient hospitalization.  Will continue today 12/26/2022 plan as below except where it is noted.   Principal/active diagnoses:   Schizoaffective disorder, bipolar type.    Associated symptoms.  Angry outburst. Suicidal ideations.  Crying spell.  Irritability.  Plan: Re-initiated:  -Continue Buspar 15 mg po bid for anxiety.  -Continue paliperidone 3  mg po daily for mood control.  -Continue Sertraline 50 mg po Q daily for depression. -Continue Trazodone 100 mg po Q bedtime prn for insomnia.  -Continue Nicoderm topically Q 24 hrs for nicotine withdrawal management.   Agitation protocols: Cont as recommended;  -Benadryl 50 mg po or IM tid prn. -Haldol 5 mg po or IM tid prn.  -Lorazepam 2 mg po or IM tid prn.   Other PRNS -Continue Tylenol 650 mg every 6 hours PRN for mild pain -Continue Maalox 30 ml Q 4 hrs PRN for indigestion -Continue MOM 30 ml po Q 6 hrs for constipation   Safety and Monitoring: Voluntary admission to inpatient psychiatric unit for safety, stabilization and treatment Daily contact with patient to assess and evaluate symptoms and progress in treatment Patient's case to be discussed in multi-disciplinary team meeting Observation Level : q15 minute checks Vital signs: q12 hours Precautions: Safety   Discharge Planning: Social work and case management to assist with discharge planning and identification of hospital follow-up needs prior to discharge Estimated LOS: 5-7 days Discharge Concerns: Need to establish a safety plan; Medication compliance and effectiveness Discharge Goals: Return home with outpatient referrals for mental health follow-up including medication management/psychotherapy  Cecilie Lowers, FNP 12/26/2022, 11:21 AMPatient ID: Larey Brick, female   DOB: December 10, 1975, 47 y.o.   MRN: [id number] Patient ID: LINDSY COLAN, female   DOB: [birth year], 47 y.o.   MRN: [id number] Patient ID: MARTHA GLENDE, female   DOB: Apr 25, 1976, 47 y.o.   MRN: [id number] Patient ID: DANAPAOLA FURBEE, female   DOB: 20-Dec-1975, 47 y.o.   MRN: [id number]

## 2022-12-26 NOTE — BH IP Treatment Plan (Signed)
Interdisciplinary Treatment and Diagnostic Plan Update  12/26/2022 Time of Session: 8:30am, update  Yolanda [name] MRN: [id number]  Principal Diagnosis: Schizoaffective disorder (HCC)  Secondary Diagnoses: Principal Problem:   Schizoaffective disorder (HCC) Active Problems:   MDD (major depressive disorder)   Current Medications:  Current Facility-Administered Medications  Medication Dose Route Frequency Provider Last Rate Last Admin   acetaminophen (TYLENOL) tablet 650 mg  650 mg Oral Q6H PRN [name], NP       alum & mag hydroxide-simeth (MAALOX/MYLANTA) 200-200-20 MG/5ML suspension 30 mL  30 mL Oral Q4H PRN [name], NP       busPIRone (BUSPAR) tablet 15 mg  15 mg Oral BID [name], NP   15 mg at 12/26/22 0830   diphenhydrAMINE (BENADRYL) capsule 50 mg  50 mg Oral TID PRN [name], NP       Or   diphenhydrAMINE (BENADRYL) injection 50 mg  50 mg Intramuscular TID PRN [name], NP       haloperidol (HALDOL) tablet 5 mg  5 mg Oral TID PRN [name], NP       Or   haloperidol lactate (HALDOL) injection 5 mg  5 mg Intramuscular TID PRN [name], NP       LORazepam (ATIVAN) tablet 2 mg  2 mg Oral TID PRN [name], NP       Or   LORazepam (ATIVAN) injection 2 mg  2 mg Intramuscular TID PRN [name], NP       magnesium hydroxide (MILK OF MAGNESIA) suspension 30 mL  30 mL Oral Daily PRN [name], NP       nicotine (NICODERM CQ - dosed in mg/24 hours) patch 14 mg  14 mg Transdermal Daily [name], NP       paliperidone (INVEGA) 24 hr tablet 3 mg  3 mg Oral Daily Nwoko, Agnes I, NP   3 mg at 12/26/22 0831   pantoprazole (PROTONIX) EC tablet 40 mg  40 mg Oral Daily [name], NP   40 mg at 12/26/22 0830   sertraline (ZOLOFT) tablet 50 mg  50 mg Oral Daily [name], NP   50 mg at 12/26/22 0830   traZODone (DESYREL) tablet 100 mg  100 mg Oral QHS PRN [name], NP       PTA Medications: Medications Prior to Admission   Medication Sig Dispense Refill Last Dose   busPIRone (BUSPAR) 15 MG tablet Take 15 mg by mouth 2 (two) times daily. (Patient not taking: Reported on 12/19/2022)      paliperidone (INVEGA SUSTENNA) 156 MG/ML SUSY injection Inject 156 mg into the muscle once. (Patient not taking: Reported on 12/19/2022)      sertraline (ZOLOFT) 50 MG tablet Take 150 mg by mouth daily. (Patient not taking: Reported on 12/19/2022)      traZODone (DESYREL) 100 MG tablet Take 100 mg by mouth at bedtime as needed for sleep. (Patient not taking: Reported on 11/12/2022)       Patient Stressors: Medication change or noncompliance    Patient Strengths: Forensic psychologist fund of knowledge   Treatment Modalities: Medication Management, Group therapy, Case management,  1 to 1 session with clinician, Psychoeducation, Recreational therapy.   Physician Treatment Plan for Primary Diagnosis: Schizoaffective disorder (HCC) Long Term Goal(s): Improvement in symptoms so as ready for discharge   Short Term Goals: Ability to identify and develop effective coping behaviors will improve Ability to maintain clinical measurements  within normal limits will improve Compliance with prescribed medications will improve Ability to identify triggers associated with substance abuse/mental health issues will improve Ability to identify changes in lifestyle to reduce recurrence of condition will improve Ability to verbalize feelings will improve Ability to disclose and discuss suicidal ideas Ability to demonstrate self-control will improve  Medication Management: Evaluate patient's response, side effects, and tolerance of medication regimen.  Therapeutic Interventions: 1 to 1 sessions, Unit Group sessions and Medication administration.  Evaluation of Outcomes: Progressing  Physician Treatment Plan for Secondary Diagnosis: Principal Problem:   Schizoaffective disorder (HCC) Active Problems:   MDD (major depressive  disorder)  Long Term Goal(s): Improvement in symptoms so as ready for discharge   Short Term Goals: Ability to identify and develop effective coping behaviors will improve Ability to maintain clinical measurements within normal limits will improve Compliance with prescribed medications will improve Ability to identify triggers associated with substance abuse/mental health issues will improve Ability to identify changes in lifestyle to reduce recurrence of condition will improve Ability to verbalize feelings will improve Ability to disclose and discuss suicidal ideas Ability to demonstrate self-control will improve     Medication Management: Evaluate patient's response, side effects, and tolerance of medication regimen.  Therapeutic Interventions: 1 to 1 sessions, Unit Group sessions and Medication administration.  Evaluation of Outcomes: Progressing   RN Treatment Plan for Primary Diagnosis: Schizoaffective disorder (HCC) Long Term Goal(s): Knowledge of disease and therapeutic regimen to maintain health will improve  Short Term Goals: Ability to remain free from injury will improve, Ability to verbalize frustration and anger appropriately will improve, Ability to demonstrate self-control, Ability to participate in decision making will improve, Ability to verbalize feelings will improve, Ability to disclose and discuss suicidal ideas, Ability to identify and develop effective coping behaviors will improve, and Compliance with prescribed medications will improve  Medication Management: RN will administer medications as ordered by provider, will assess and evaluate patient's response and provide education to patient for prescribed medication. RN will report any adverse and/or side effects to prescribing provider.  Therapeutic Interventions: 1 on 1 counseling sessions, Psychoeducation, Medication administration, Evaluate responses to treatment, Monitor vital signs and CBGs as ordered,  Perform/monitor CIWA, COWS, AIMS and Fall Risk screenings as ordered, Perform wound care treatments as ordered.  Evaluation of Outcomes: Progressing   LCSW Treatment Plan for Primary Diagnosis: Schizoaffective disorder (HCC) Long Term Goal(s): Safe transition to appropriate next level of care at discharge, Engage patient in therapeutic group addressing interpersonal concerns.  Short Term Goals: Engage patient in aftercare planning with referrals and resources, Increase social support, Increase ability to appropriately verbalize feelings, Increase emotional regulation, Facilitate acceptance of mental health diagnosis and concerns, Facilitate patient progression through stages of change regarding substance use diagnoses and concerns, Identify triggers associated with mental health/substance abuse issues, and Increase skills for wellness and recovery  Therapeutic Interventions: Assess for all discharge needs, 1 to 1 time with Social worker, Explore available resources and support systems, Assess for adequacy in community support network, Educate family and significant other(s) on suicide prevention, Complete Psychosocial Assessment, Interpersonal group therapy.  Evaluation of Outcomes: Progressing   Progress in Treatment: Attending groups: No. Participating in groups: No. Taking medication as prescribed: Yes. Toleration medication: Yes. Family/Significant other contact made: Yes, individual(s) contacted:  mother contacted  Patient understands diagnosis: No. Discussing patient identified problems/goals with staff: Yes. Medical problems stabilized or resolved: Yes. Denies suicidal/homicidal ideation: Yes. Issues/concerns per patient self-inventory: No.   New problem(s) identified:  No, Describe:  none reported   New Short Term/Long Term Goal(s):  medication stabilization, elimination of SI thoughts, development of comprehensive mental wellness plan.    Patient Goals:  Pt will continue to  work on initial tx team goals.   Discharge Plan or Barriers: Patient was kicked out of group home and mom does not want her back.  Pt has follow up appointment for therapy and med management at [facility].   Reason for Continuation of Hospitalization: Anxiety Depression Hallucinations Medication stabilization Other; describe IDD  Estimated Length of Stay: TBD- placement issue and need clarification about what kind of environment Drs recommend for patient.   Last 3 [place] Suicide Severity Risk Score: Flowsheet Row Ad[facility] (Current) [facility] from 12/19/2022 in [facility] from 11/12/2022 in [facility] Emergency Department at [facility]  C-SSRS RISK CATEGORY High Risk High Risk Moderate Risk       Last [facility] 2/9 Scores:     No data to display          Scribe for Treatment Team: Beatris Si, LCSW 12/26/2022 10:26 AM

## 2022-12-26 NOTE — Plan of Care (Signed)
  Problem: Coping: Goal: Coping ability will improve Outcome: Progressing   Problem: Health Behavior/Discharge Planning: Goal: Compliance with therapeutic regimen will improve Outcome: Progressing   Problem: Self-Concept: Goal: Level of anxiety will decrease Outcome: Progressing

## 2022-12-26 NOTE — Progress Notes (Signed)
   12/26/22 1046  Psych Admission Type (Psych Patients Only)  Admission Status Voluntary  Psychosocial Assessment  Patient Complaints None  Eye Contact Brief  Facial Expression Flat  Affect Apprehensive  Speech Soft;Slow  Interaction Forwards little  Motor Activity Slow  Appearance/Hygiene Disheveled  Behavior Characteristics Cooperative;Unwilling to participate  Mood Pleasant  Thought Process  Coherency WDL  Content WDL  Delusions None reported or observed  Perception WDL  Hallucination None reported or observed  Judgment Limited  Confusion None  Danger to Self  Current suicidal ideation? Denies  Self-Injurious Behavior No self-injurious ideation or behavior indicators observed or expressed   Agreement Not to Harm Self Yes  Description of Agreement verbal  Danger to Others  Danger to Others None reported or observed

## 2022-12-26 NOTE — Progress Notes (Signed)
   12/26/22 2000  Psych Admission Type (Psych Patients Only)  Admission Status Voluntary  Psychosocial Assessment  Patient Complaints None  Eye Contact Brief  Facial Expression Flat  Affect Sad  Speech Soft  Interaction Guarded  Motor Activity Slow  Appearance/Hygiene Disheveled  Behavior Characteristics Cooperative  Mood Sad  Aggressive Behavior  Effect No apparent injury  Thought Process  Coherency WDL  Content WDL  Delusions WDL  Perception WDL  Hallucination None reported or observed  Judgment Limited  Confusion None  Danger to Self  Current suicidal ideation? Denies  Danger to Others  Danger to Others None reported or observed

## 2022-12-26 NOTE — [facility] Group Notes (Signed)
[facility] Group Notes:  (Nursing/MHT/Case Management/Adjunct)  Date:  12/26/2022  Time:  9:28 AM  Type of Therapy:  Group Topic/ Focus: Goals Group: The focus of this group is to help patients establish daily goals to achieve during treatment and discuss how the patient can incorporate goal setting into their daily lives to aide in recovery.   Participation Level:  Did Not Attend  Summary of Progress/Problems:  Patient did not attend goals/ orientation group today.   Daneil Dan 12/26/2022, 9:28 AM

## 2022-12-26 NOTE — Group Note (Signed)
Recreation Therapy Group Note   Group Topic:Other  Group Date: 12/26/2022 Start Time: 1403 End Time: 1445 Facilitators: Cale Bethard-McCall, LRT,CTRS Location: [address] Dayroom   Activity Description/Intervention: Therapeutic Drumming. Patients with peers and staff were given the opportunity to engage in a leader facilitated [employer] Empowerment Drumming Circle with staff from the [employer], in partnership with [employer]. Teaching laboratory technician and trained [employer], Theodoro Doing leading with LRT observing and documenting intervention and pt response. This evidenced-based practice targets 7 areas of health and wellbeing in the human experience including: stress-reduction, exercise, self-expression, camaraderie/support, nurturing, spirituality, and music-making (leisure).   Goal Area(s) Addresses:  Patient will engage in pro-social way in music group.  Patient will follow directions of drum leader on the first prompt. Patient will demonstrate no behavioral issues during group.  Patient will identify if a reduction in stress level occurs as a result of participation in therapeutic drum circle.    Education: Leisure exposure, Pharmacologist, Musical expression, Discharge Planning   Affect/Mood: Appropriate   Participation Level: Engaged   Participation Quality: Independent   Behavior: Appropriate   Speech/Thought Process: Focused   Insight: Good   Judgement: Good   Modes of Intervention: Teaching laboratory technician   Patient Response to Interventions:  Engaged   Education Outcome:  Acknowledges education   Clinical Observations/Individualized Feedback: Yolanda [name] actively engaged in therapeutic drumming exercise and discussions. Pt was appropriate with peers, staff, and musical equipment for duration of programming. Pt affect congruent/incongruent with verbalized emotion.     Plan: Continue to engage patient in RT group sessions  2-3x/week.   Yolanda [name], LRT,CTRS 12/26/2022 3:53 PM

## 2022-12-26 NOTE — Progress Notes (Signed)
   12/25/22 2300  Psych Admission Type (Psych Patients Only)  Admission Status Involuntary  Psychosocial Assessment  Eye Contact Fair  Facial Expression Flat  Affect Sad  Speech Soft  Interaction Minimal  Motor Activity Slow;Tremors  Appearance/Hygiene Disheveled  Behavior Characteristics Cooperative;Appropriate to situation  Mood Pleasant  Thought Process  Coherency WDL  Content WDL  Delusions None reported or observed  Perception WDL  Hallucination None reported or observed  Judgment Limited  Confusion None  Danger to Self  Current suicidal ideation? Denies  Self-Injurious Behavior No self-injurious ideation or behavior indicators observed or expressed   Agreement Not to Harm Self Yes  Description of Agreement verbal  Danger to Others  Danger to Others None reported or observed

## 2022-12-27 DIAGNOSIS — F250 Schizoaffective disorder, bipolar type: Secondary | ICD-10-CM | POA: Diagnosis not present

## 2022-12-27 NOTE — Progress Notes (Signed)
   12/27/22 2200  Psych Admission Type (Psych Patients Only)  Admission Status Voluntary  Psychosocial Assessment  Patient Complaints None  Eye Contact Brief  Facial Expression Flat  Affect Sad  Speech Soft  Interaction Guarded  Motor Activity Slow  Appearance/Hygiene Disheveled  Behavior Characteristics Cooperative  Mood Pleasant  Aggressive Behavior  Effect No apparent injury  Thought Process  Coherency WDL  Content WDL  Delusions WDL  Perception WDL  Hallucination None reported or observed  Judgment Limited  Confusion None  Danger to Self  Current suicidal ideation? Denies  Danger to Others  Danger to Others None reported or observed

## 2022-12-27 NOTE — Group Note (Signed)
Date:  12/27/2022 Time:  9:50 AM  Group Topic/Focus:  Orientation:   The focus of this group is to educate the patient on the purpose and policies of crisis stabilization and provide a format to answer questions about their admission.  The group details unit policies and expectations of patients while admitted.    Participation Level:  Active  Participation Quality:  Appropriate  Affect:  Appropriate  Cognitive:  Oriented  Insight: Appropriate  Engagement in Group:  Engaged  Modes of Intervention:  Confrontation  Additional Comments:     [name] 12/27/2022, 9:50 AM

## 2022-12-27 NOTE — Progress Notes (Signed)
Patient ID: Yolanda [name], female   DOB: Oct 31, 1975, 47 y.o.   MRN: [id number] [facility] MD Progress Note  12/27/2022 3:24 PM Minerva JAIMEE TYRER  MRN:  [id number]  Reason for admission: This is the first psychiatric admission in this [facility] in 12 years for this 64 AA female with an extensive hx of mental illnesses & probable polysubstance use disorders. She is admitted to the [facility] from the [facility] hospital with complain of worsening suicidal ideations with plan to stab herself. Per chart review, patient apparently reported at the ED that she has been depressed for a while & has not been taking her mental health medications. After medical evaluation.clearance, she was transferred to the [facility] for further psychiatric evaluation/treatments.   Yesterday the psychiatry team made the following recommendations:-  -Continue Buspar 15 mg po bid for anxiety.  -Continue paliperidone 3 mg po daily for mood control.  -Continue Sertraline 50 mg po Q daily for depression. -Continue Trazodone 100 mg po Q bedtime prn for insomnia.  -Continue Nicoderm topically Q 24 hrs for nicotine withdrawal management.   On assessment today, the pt reports that her mood is improving, affect is pleasant with smiles. Reports that anxiety symptoms are manageable.  Rates anxiety as #2/10, with 10 being high severity Sleep is good, reports sleeping 8 hours last night. Appetite is better, eating all her meals in the cafeteria Concentration is improving and able to focus on the examination Energy level is adequate.  Observed attending and participating in the group therapy discussing the purpose and policies of crisis stabilization. Denies suicidal thoughts. Denies suicidal intent and plan.  Denies having any HI.  Denies having psychotic symptoms.   Denies having side effects to current psychiatric medications.   We discussed compliance to current medication regimen while in the hospital and after discharge.  Further discussed  improvement of ADLs and personal hygiene.  Discussed the following psychosocial stressors: Attending and participating in group activities and therapeutic milieu.  Going to the dining room with the patient group forl meals.  LCSW to follow-up with safety plan and resources for patient's discharge.  Principal Problem: Schizoaffective disorder (HCC)  Diagnosis: Principal Problem:   Schizoaffective disorder (HCC) Active Problems:   MDD (major depressive disorder)  Total Time spent with patient:  30 minute  Past Psychiatric History: See H&P.  Past Medical History:  Past Medical History:  Diagnosis Date   Bipolar affect, depressed (HCC)    Depression     Past Surgical History:  Procedure Laterality Date   NO PAST SURGERIES     SALPINGECTOMY     Family History: History reviewed. No pertinent family history.  Family Psychiatric  History: See H&P.  Social History:  Social History   Substance and Sexual Activity  Alcohol Use Yes     Social History   Substance and Sexual Activity  Drug Use Yes   Types: Cocaine, Marijuana    Social History   Socioeconomic History   Marital status: Single    Spouse name: Not on file   Number of children: Not on file   Years of education: Not on file   Highest education level: Not on file  Occupational History   Not on file  Tobacco Use   Smoking status: Every Day   Smokeless tobacco: Not on file  Substance and Sexual Activity   Alcohol use: Yes   Drug use: Yes    Types: Cocaine, Marijuana   Sexual activity: Yes  Other Topics Concern  Not on file  Social History Narrative   Not on file   Social Determinants of Health   Financial Resource Strain: Not on file  Food Insecurity: Patient Declined (12/20/2022)   Hunger Vital Sign    Worried About Running Out of Food in the Last Year: Patient declined    Ran Out of Food in the Last Year: Patient declined  Transportation Needs: No Transportation Needs (12/20/2022)   PRAPARE -  Administrator, Civil Service (Medical): No    Lack of Transportation (Non-Medical): No  Physical Activity: Not on file  Stress: Not on file  Social Connections: Not on file   Additional Social History:   Sleep: Good  Appetite:  Good  Current Medications: Current Facility-Administered Medications  Medication Dose Route Frequency Provider Last Rate Last Admin   acetaminophen (TYLENOL) tablet 650 mg  650 mg Oral Q6H PRN [name], NP       alum & mag hydroxide-simeth (MAALOX/MYLANTA) 200-200-20 MG/5ML suspension 30 mL  30 mL Oral Q4H PRN [name], NP       busPIRone (BUSPAR) tablet 15 mg  15 mg Oral BID [name], NP   15 mg at 12/27/22 4098   diphenhydrAMINE (BENADRYL) capsule 50 mg  50 mg Oral TID PRN [name], NP       Or   diphenhydrAMINE (BENADRYL) injection 50 mg  50 mg Intramuscular TID PRN [name], NP       haloperidol (HALDOL) tablet 5 mg  5 mg Oral TID PRN [name], NP       Or   haloperidol lactate (HALDOL) injection 5 mg  5 mg Intramuscular TID PRN [name], NP       LORazepam (ATIVAN) tablet 2 mg  2 mg Oral TID PRN [name], NP       Or   LORazepam (ATIVAN) injection 2 mg  2 mg Intramuscular TID PRN [name], NP       magnesium hydroxide (MILK OF MAGNESIA) suspension 30 mL  30 mL Oral Daily PRN [name], NP       nicotine (NICODERM CQ - dosed in mg/24 hours) patch 14 mg  14 mg Transdermal Daily [name], NP   14 mg at 12/27/22 0817   paliperidone (INVEGA) 24 hr tablet 3 mg  3 mg Oral Daily [name], NP   3 mg at 12/27/22 0816   pantoprazole (PROTONIX) EC tablet 40 mg  40 mg Oral Daily [name], NP   40 mg at 12/27/22 0817   sertraline (ZOLOFT) tablet 50 mg  50 mg Oral Daily [name], NP   50 mg at 12/27/22 0816   traZODone (DESYREL) tablet 100 mg  100 mg Oral QHS PRN [name], NP       Lab Results:  No results found for this or any previous visit (from the past 48  hour(s)).  Blood Alcohol level:  Lab Results  Component Value Date   ETH <10 12/19/2022   ETH <10 11/21/2019   Metabolic Disorder Labs: Lab Results  Component Value Date   HGBA1C 4.4 (L) 12/21/2022   MPG 79.58 12/21/2022   No results found for: "PROLACTIN" Lab Results  Component Value Date   CHOL 218 (H) 12/21/2022   TRIG 81 12/21/2022   HDL 53 12/21/2022   CHOLHDL 4.1 12/21/2022   VLDL 16 12/21/2022   LDLCALC 149 (H) 12/21/2022   LDLCALC 110 (H) 03/13/2011  Physical Findings: AIMS:  , ,  ,  ,    CIWA:    COWS:     Musculoskeletal: Strength & Muscle Tone: within normal limits Gait & Station: normal Patient leans: N/A  Psychiatric Specialty Exam:  Presentation  General Appearance:  Disheveled; Casual  Eye Contact: Fair  Speech: Clear and Coherent; Normal Rate  Speech Volume: Normal  Handedness: Right  Mood and Affect  Mood: Depressed; Anxious  Affect: Congruent  Thought Process  Thought Processes: Coherent; Goal Directed; Linear  Descriptions of Associations:Intact  Orientation:Full (Time, Place and Person)  Thought Content:Logical; Tangential  History of Schizophrenia/Schizoaffective disorder:Yes  Duration of Psychotic Symptoms:Greater than six months  Hallucinations:Hallucinations: None  Ideas of Reference:None  Suicidal Thoughts:Suicidal Thoughts: No  Homicidal Thoughts:Homicidal Thoughts: No  Sensorium  Memory: Immediate Fair  Judgment: Impaired  Insight: Shallow  Executive Functions  Concentration: Fair  Attention Span: Fair  Recall: Fair  Fund of Knowledge: Fair  Language: Fair  Psychomotor Activity  Psychomotor Activity: Psychomotor Activity: Normal  Assets  Assets: Communication Skills; Desire for Improvement; Physical Health; Resilience  Sleep  Sleep: Sleep: Good Number of Hours of Sleep: 8  Physical Exam: Physical Exam Vitals and nursing note reviewed.  HENT:     Nose: Nose normal.      Mouth/Throat:     Pharynx: Oropharynx is clear.  Eyes:     Pupils: Pupils are equal, round, and reactive to light.  Cardiovascular:     Rate and Rhythm: Tachycardia present.     Comments: Elevated pulse rate (114). Pulmonary:     Effort: Pulmonary effort is normal.  Genitourinary:    Comments: Deferred Musculoskeletal:        General: Normal range of motion.     Cervical back: Normal range of motion.  Skin:    General: Skin is warm and dry.  Neurological:     General: No focal deficit present.     Mental Status: She is alert and oriented to person, place, and time.  Psychiatric:        Behavior: Behavior normal.     Comments: Mood is labile and crying due to not having somewhere to go to upon discharge    Review of Systems  Constitutional:  Negative for chills, diaphoresis and fever.  HENT:  Negative for congestion and sore throat.   Eyes:  Negative for blurred vision.  Respiratory:  Negative for cough, shortness of breath and wheezing.   Cardiovascular:  Negative for chest pain and palpitations.  Gastrointestinal:  Negative for abdominal pain, diarrhea, heartburn, nausea and vomiting.  Genitourinary:  Negative for dysuria.  Musculoskeletal:  Negative for myalgias and neck pain.  Skin:  Negative for itching and rash.  Neurological:  Negative for dizziness, tingling, tremors, sensory change, speech change, focal weakness, seizures, loss of consciousness, weakness and headaches.  Endo/Heme/Allergies:        See allergy listing  Psychiatric/Behavioral:  Positive for depression. Negative for hallucinations, memory loss, substance abuse (Hx cocaine use disorder) and suicidal ideas. The patient is nervous/anxious. The patient does not have insomnia.    Blood pressure (!) 84/68, pulse (!) 116, temperature 98.5 F (36.9 C), temperature source Oral, resp. rate 16, height 4\' 11"  (1.499 m), weight 63 kg, SpO2 100 %. Body mass index is 28.05 kg/m.  Treatment Plan Summary: Daily  contact with patient to assess and evaluate symptoms and progress in treatment and Medication management.   Continue inpatient hospitalization.  Will continue today 12/27/2022 plan as below except  where it is noted.   Principal/active diagnoses:  Schizoaffective disorder, bipolar type.    Associated symptoms.  Angry outburst. Suicidal ideations.  Crying spell.  Irritability.  Plan: Re-initiated:  -Continue Buspar 15 mg po bid for anxiety.  -Continue paliperidone 3 mg po daily for mood control.  -Continue Sertraline 50 mg po Q daily for depression. -Continue Trazodone 100 mg po Q bedtime prn for insomnia.  -Continue Nicoderm topically Q 24 hrs for nicotine withdrawal management.   Agitation protocols: Cont as recommended;  -Benadryl 50 mg po or IM tid prn. -Haldol 5 mg po or IM tid prn.  -Lorazepam 2 mg po or IM tid prn.   Other PRNS -Continue Tylenol 650 mg every 6 hours PRN for mild pain -Continue Maalox 30 ml Q 4 hrs PRN for indigestion -Continue MOM 30 ml po Q 6 hrs for constipation   Safety and Monitoring: Voluntary admission to inpatient psychiatric unit for safety, stabilization and treatment Daily contact with patient to assess and evaluate symptoms and progress in treatment Patient's case to be discussed in multi-disciplinary team meeting Observation Level : q15 minute checks Vital signs: q12 hours Precautions: Safety   Discharge Planning: Social work and case management to assist with discharge planning and identification of hospital follow-up needs prior to discharge Estimated LOS: 5-7 days Discharge Concerns: Need to establish a safety plan; Medication compliance and effectiveness Discharge Goals: Return home with outpatient referrals for mental health follow-up including medication management/psychotherapy  Cecilie Lowers, FNP 12/27/2022, 3:24 PMPatient ID: Larey Brick, female   DOB: 1975-07-12, 47 y.o.   MRN: [id number] Patient ID: MADDILYNN BONZO, female    DOB: [birth year], 47 y.o.   MRN: [id number] Patient ID: TEESHA GUNNERSON, female   DOB: Feb 15, 1976, 47 y.o.   MRN: [id number] Patient ID: CYLAH FRIEDT, female   DOB: 10/18/1975, 47 y.o.   MRN: [id number] Patient ID: RAMONIA ZULEGER, female   DOB: 1976-02-01, 47 y.o.   MRN: [id number]

## 2022-12-27 NOTE — Progress Notes (Signed)
   12/27/22 1300  Psych Admission Type (Psych Patients Only)  Admission Status Voluntary  Psychosocial Assessment  Patient Complaints None  Eye Contact Brief  Facial Expression Flat  Affect Sad  Speech Soft  Interaction Guarded  Motor Activity Slow  Appearance/Hygiene Disheveled  Behavior Characteristics Cooperative  Mood Pleasant   Dar Note: Patient presents with a calm mood and affect.  Denies suicidal thoughts, auditory and visual hallucinations.  Medications given as prescribed.  Patient attended group and participated.  Routine safety checks maintained.  Patient is safe on and off the unit.

## 2022-12-27 NOTE — Group Note (Deleted)
Date:  12/27/2022 Time:  6:04 AM  Group Topic/Focus:  Goals Group:   The focus of this group is to help patients establish daily goals to achieve during treatment and discuss how the patient can incorporate goal setting into their daily lives to aide in recovery.     Participation Level:  {B[facility] PARTICIPATION LEVEL:22264}  Participation Quality:  {B[facility] PARTICIPATION QUALITY:22265}  Affect:  {BHH AFFECT:22266}  Cognitive:  {B[facility] COGNITIVE:22267}  Insight: {BHH Insight2:20797}  Engagement in Group:  {B[facility] ENGAGEMENT IN GROUP:22268}  Modes of Intervention:  {BHH MODES OF INTERVENTION:22269}  Additional Comments:  ***  Shirlena Brinegar 12/27/2022, 6:04 AM  

## 2022-12-27 NOTE — Group Note (Signed)
Date:  12/27/2022 Time:  12:12 PM  Group Topic/Focus:  Identifying Needs:   The focus of this group is to help patients identify their personal needs that have been historically problematic and identify healthy behaviors to address their needs. Overcoming Stress:   The focus of this group is to define stress and help patients assess their triggers.    Participation Level:  Did Not Attend  Participation Quality:    Affect:    Cognitive:    Insight:   Engagement in Group:   Modes of Intervention:    Additional Comments:    Yolanda [name] 12/27/2022, 12:12 PM

## 2022-12-28 MED ORDER — HYDROXYZINE HCL 25 MG PO TABS
ORAL_TABLET | ORAL | Status: AC
  Administered 2022-12-28: 25 mg
  Filled 2022-12-28: qty 1

## 2022-12-28 MED ORDER — NICOTINE 21 MG/24HR TD PT24
21.0000 mg | MEDICATED_PATCH | Freq: Every day | TRANSDERMAL | Status: DC
  Administered 2023-01-01 – 2023-05-14 (×36): 21 mg via TRANSDERMAL
  Filled 2022-12-28 (×148): qty 1

## 2022-12-28 MED ORDER — HYDROXYZINE HCL 25 MG PO TABS
25.0000 mg | ORAL_TABLET | Freq: Three times a day (TID) | ORAL | Status: DC | PRN
  Administered 2023-01-01 – 2023-08-05 (×70): 25 mg via ORAL
  Filled 2022-12-28 (×24): qty 1
  Filled 2022-12-28: qty 10
  Filled 2022-12-28 (×49): qty 1

## 2022-12-28 MED ORDER — SERTRALINE HCL 25 MG PO TABS
75.0000 mg | ORAL_TABLET | Freq: Every day | ORAL | Status: DC
  Administered 2022-12-29: 75 mg via ORAL
  Filled 2022-12-28 (×2): qty 3

## 2022-12-28 NOTE — B[facility] Group Notes (Signed)
Adult Psychoeducational Group Note  Date:  12/28/2022 Time:  9:16 PM  Group Topic/Focus:  Wrap-Up Group:   The focus of this group is to help patients review their daily goal of treatment and discuss progress on daily workbooks.  Participation Level:  Did Not Attend  Christ Kick 12/28/2022, 9:16 PM

## 2022-12-28 NOTE — Group Note (Signed)
Date:  12/28/2022 Time:  10:14 AM  Group Topic/Focus:  Goals Group:   The focus of this group is to help patients establish daily goals to achieve during treatment and discuss how the patient can incorporate goal setting into their daily lives to aide in recovery.    Participation Level:  Minimal  Participation Quality:  Inattentive  Affect:  Blunted  Cognitive:  Lacking  Insight: Lacking  Engagement in Group:  Poor  Modes of Intervention:  Discussion  Additional Comments:     [name] 12/28/2022, 10:14 AM

## 2022-12-28 NOTE — Progress Notes (Addendum)
Patient ID: Yolanda [name], female   DOB: [birth year], 47 y.o.   MRN: [id number] [facility] MD Progress Note  12/28/2022 3:17 PM Yolanda [name]  MRN:  [id number]  Reason for admission: This is the first psychiatric admission in this [facility] in 12 years for this 58 AA female with an extensive hx of mental illnesses & probable polysubstance use disorders. She is admitted to the [facility] from the [facility] hospital with complain of worsening suicidal ideations with plan to stab herself. Per chart review, patient apparently reported at the ED that she has been depressed for a while & has not been taking her mental health medications. After medical evaluation.clearance, she was transferred to the [name] for further psychiatric evaluation/treatments.   On assessment today, Patient was initially seen in bed awake mid morning, no acute distress. During evaluation she was tearful.  Patient became tearful when asked how she was feeling. Stated that she wants to go home to her mother. Reported her sleep was fair but her energy is low.  Stated that her appetite is intact.  Discussed increasing zoloft, she was amenable to med changes.  She denied medication side effects at this time.  Today, he denied SI/HI/AVH, paranoia.   Review of Systems  Respiratory:  Negative for shortness of breath.   Cardiovascular:  Negative for chest pain.  Gastrointestinal:  Negative for nausea and vomiting.  Neurological:  Negative for dizziness and headaches.     Principal Problem: Schizoaffective disorder (HCC)   Diagnosis: Principal Problem:   Schizoaffective disorder (HCC) Active Problems:   MDD (major depressive disorder)  Total Time spent with patient: see attending attestation  Past Psychiatric History: See H&P.  Past Medical History:  Past Medical History:  Diagnosis Date   Bipolar affect, depressed (HCC)    Depression     Past Surgical History:  Procedure Laterality Date   NO PAST SURGERIES     SALPINGECTOMY      Family History: History reviewed. No pertinent family history.  Family Psychiatric  History: See H&P.  Social History:  Social History   Substance and Sexual Activity  Alcohol Use Yes     Social History   Substance and Sexual Activity  Drug Use Yes   Types: Cocaine, Marijuana    Social History   Socioeconomic History   Marital status: Single    Spouse name: Not on file   Number of children: Not on file   Years of education: Not on file   Highest education level: Not on file  Occupational History   Not on file  Tobacco Use   Smoking status: Every Day   Smokeless tobacco: Not on file  Substance and Sexual Activity   Alcohol use: Yes   Drug use: Yes    Types: Cocaine, Marijuana   Sexual activity: Yes  Other Topics Concern   Not on file  Social History Narrative   Not on file   Social Determinants of Health   Financial Resource Strain: Not on file  Food Insecurity: Patient Declined (12/20/2022)   Hunger Vital Sign    Worried About Running Out of Food in the Last Year: Patient declined    Ran Out of Food in the Last Year: Patient declined  Transportation Needs: No Transportation Needs (12/20/2022)   PRAPARE - Administrator, Civil Service (Medical): No    Lack of Transportation (Non-Medical): No  Physical Activity: Not on file  Stress: Not on file  Social Connections: Not on file  Additional Social History:   Sleep: Good  Appetite:  Good  Current Medications: Current Facility-Administered Medications  Medication Dose Route Frequency Provider Last Rate Last Admin   acetaminophen (TYLENOL) tablet 650 mg  650 mg Oral Q6H PRN [name], NP       alum & mag hydroxide-simeth (MAALOX/MYLANTA) 200-200-20 MG/5ML suspension 30 mL  30 mL Oral Q4H PRN [name], NP       busPIRone (BUSPAR) tablet 15 mg  15 mg Oral BID [name], NP   15 mg at 12/28/22 9629   diphenhydrAMINE (BENADRYL) capsule 50 mg  50 mg Oral TID PRN [name], NP        Or   diphenhydrAMINE (BENADRYL) injection 50 mg  50 mg Intramuscular TID PRN [name], NP       haloperidol (HALDOL) tablet 5 mg  5 mg Oral TID PRN [name], NP       Or   haloperidol lactate (HALDOL) injection 5 mg  5 mg Intramuscular TID PRN [name], NP       hydrOXYzine (ATARAX) tablet 25 mg  25 mg Oral TID PRN [name], DO       LORazepam (ATIVAN) tablet 2 mg  2 mg Oral TID PRN [name], NP       Or   LORazepam (ATIVAN) injection 2 mg  2 mg Intramuscular TID PRN [name], NP       magnesium hydroxide (MILK OF MAGNESIA) suspension 30 mL  30 mL Oral Daily PRN [name], NP       Melene Muller ON 12/29/2022] nicotine (NICODERM CQ - dosed in mg/24 hours) patch 21 mg  21 mg Transdermal Daily [name], DO       paliperidone (INVEGA) 24 hr tablet 3 mg  3 mg Oral Daily Nwoko, Agnes I, NP   3 mg at 12/28/22 0836   pantoprazole (PROTONIX) EC tablet 40 mg  40 mg Oral Daily [name], NP   40 mg at 12/28/22 0837   [START ON 12/29/2022] sertraline (ZOLOFT) tablet 75 mg  75 mg Oral Daily [name], DO       traZODone (DESYREL) tablet 100 mg  100 mg Oral QHS PRN [name], NP       Lab Results:  No results found for this or any previous visit (from the past 48 hour(s)).  Blood Alcohol level:  Lab Results  Component Value Date   ETH <10 12/19/2022   ETH <10 11/21/2019   Metabolic Disorder Labs: Lab Results  Component Value Date   HGBA1C 4.4 (L) 12/21/2022   MPG 79.58 12/21/2022   No results found for: "PROLACTIN" Lab Results  Component Value Date   CHOL 218 (H) 12/21/2022   TRIG 81 12/21/2022   HDL 53 12/21/2022   CHOLHDL 4.1 12/21/2022   VLDL 16 12/21/2022   LDLCALC 149 (H) 12/21/2022   LDLCALC 110 (H) 03/13/2011   Physical Findings: AIMS:  , ,  ,  ,    CIWA:    COWS:     Musculoskeletal: Strength & Muscle Tone: within normal limits Gait & Station: normal Patient leans: N/A  Psychiatric Specialty Exam:  Presentation   General Appearance:  Appropriate for Environment; Casual; Fairly Groomed  Eye Contact: None  Speech: Clear and Coherent; Normal Rate  Speech Volume: Decreased  Handedness: Right  Mood and Affect  Mood: Depressed  Affect: Congruent; Appropriate; Depressed; Restricted  Thought Process  Thought Processes: Coherent; Goal Directed;  Linear  Descriptions of Associations:Intact  Orientation:Full (Time, Place and Person)  Thought Content:Rumination  History of Schizophrenia/Schizoaffective disorder:Yes  Duration of Psychotic Symptoms:Greater than six months  Hallucinations:Hallucinations: None  Ideas of Reference:None  Suicidal Thoughts:Suicidal Thoughts: No  Homicidal Thoughts:Homicidal Thoughts: No  Sensorium  Memory: Immediate Good  Judgment: Fair  Insight: Shallow  Executive Functions  Concentration: Good  Attention Span: Good  Recall: Good  Fund of Knowledge: Good  Language: Good  Psychomotor Activity  Psychomotor Activity: Psychomotor Activity: Decreased; Psychomotor Retardation  Assets  Assets: Communication Skills; Desire for Improvement  Sleep  Sleep: Sleep: Fair Number of Hours of Sleep: 8  Physical Exam: Physical Exam Vitals and nursing note reviewed.  Constitutional:      General: She is not in acute distress.    Appearance: She is not ill-appearing or diaphoretic.  HENT:     Head: Normocephalic.     Nose: No congestion.  Pulmonary:     Effort: Pulmonary effort is normal. No respiratory distress.  Neurological:     Mental Status: She is alert and oriented to person, place, and time.    Blood pressure 112/77, pulse 74, temperature 98.5 F (36.9 C), temperature source Oral, resp. rate 16, height 4\' 11"  (1.499 m), weight 63 kg, SpO2 99 %. Body mass index is 28.05 kg/m.  Treatment Plan Summary: Daily contact with patient to assess and evaluate symptoms and progress in treatment and Medication management.    Continue inpatient hospitalization.  Will continue today 12/28/2022 plan as below except where it is noted.   Principal/active diagnoses:  Schizoaffective disorder, bipolar type.    Associated symptoms.  Angry outburst. Suicidal ideations.  Crying spell.  Irritability.  Plan: Re-initiated:  -Continue Buspar 15 mg po bid for anxiety.  -Continue paliperidone 3 mg po daily for mood control.  -INCREASED Sertraline 50 mg to 75 mg po Q daily for depression. -Continue Trazodone 100 mg po Q bedtime prn for insomnia.  -Continue Nicoderm topically Q 24 hrs for nicotine withdrawal management.   Agitation protocols: Cont as recommended;  -Benadryl 50 mg po or IM tid prn. -Haldol 5 mg po or IM tid prn.  -Lorazepam 2 mg po or IM tid prn.   Other PRNS -Continue Tylenol 650 mg every 6 hours PRN for mild pain -Continue Maalox 30 ml Q 4 hrs PRN for indigestion -Continue MOM 30 ml po Q 6 hrs for constipation   Safety and Monitoring: Voluntary admission to inpatient psychiatric unit for safety, stabilization and treatment Daily contact with patient to assess and evaluate symptoms and progress in treatment Patient's case to be discussed in multi-disciplinary team meeting Observation Level : q15 minute checks Vital signs: q12 hours Precautions: Safety   Discharge Planning: Social work and case management to assist with discharge planning and identification of hospital follow-up needs prior to discharge Estimated LOS: 5-7 days Discharge Concerns: Need to establish a safety plan; Medication compliance and effectiveness Discharge Goals: Return home with outpatient referrals for mental health follow-up including medication management/psychotherapy  [name], DO 12/28/2022, 3:17 PMPatient ID: Yolanda [name], female   DOB: [birth year], 47 y.o.   MRN: [id number] Patient ID: Yolanda [name], female   DOB: 18-Dec-1975, 47 y.o.   MRN: [id number] Patient ID: Yolanda [name], female   DOB:  08-05-1975, 47 y.o.   MRN: [id number] Patient ID: Yolanda [name], female   DOB: 18-Apr-1976, 47 y.o.   MRN: [id number] Patient ID: Yolanda [name], female   DOB: [birth year], 47 y.o.   MRN: [id number]

## 2022-12-28 NOTE — Progress Notes (Signed)
   12/28/22 2100  Psych Admission Type (Psych Patients Only)  Admission Status Voluntary  Psychosocial Assessment  Patient Complaints None  Eye Contact Brief  Facial Expression Flat  Affect Sad  Speech Soft  Interaction Guarded  Motor Activity Slow  Appearance/Hygiene Disheveled  Behavior Characteristics Cooperative  Mood Pleasant  Aggressive Behavior  Effect No apparent injury  Thought Process  Coherency WDL  Content WDL  Delusions WDL  Perception WDL  Hallucination None reported or observed  Judgment Limited  Confusion None  Danger to Self  Current suicidal ideation? Denies  Danger to Others  Danger to Others None reported or observed

## 2022-12-28 NOTE — Progress Notes (Signed)
Pt in bed most of the day. Pt presents with sad, anxious affect. Tearful at times and frequently apologetic. Pt denies SI/HI/AVH. Tolerating food and fluids. Q 15 minute checks ongoing.

## 2022-12-28 NOTE — Group Note (Signed)
Recreation Therapy Group Note   Group Topic:Team Building  Group Date: 12/28/2022 Start Time: 0930 End Time: 1005 Facilitators: Calypso Hagarty-McCall, LRT,CTRS Location: [address] Dayroom   Goal Area(s) Addresses:  Patient will effectively work with peer towards shared goal.  Patient will identify skills used to make activity successful.  Patient will identify how skills used during activity can be used to reach post d/c goals.    Group Description: Straw Bridge. In teams of 3-5, patients were given 12 plastic drinking straws and an equal length of masking tape. Using the materials provided, patients were instructed to build a free standing bridge-like structure to suspend an everyday item (ex: puzzle box) off of the floor or table surface. All materials were required to be used by the team in their design. LRT facilitated post-activity discussion reviewing team process. Patients were encouraged to reflect how the skills used in this activity can be generalized to daily life post discharge.    Affect/Mood: N/A   Participation Level: Did not attend    Clinical Observations/Individualized Feedback:     Plan: Continue to engage patient in RT group sessions 2-3x/week.   Sirinity Outland-McCall, LRT,CTRS 12/28/2022 11:59 AM

## 2022-12-29 MED ORDER — SERTRALINE HCL 100 MG PO TABS
100.0000 mg | ORAL_TABLET | Freq: Every day | ORAL | Status: DC
  Administered 2022-12-30 – 2023-01-24 (×26): 100 mg via ORAL
  Filled 2022-12-29 (×30): qty 1

## 2022-12-29 NOTE — Progress Notes (Signed)
   12/29/22 1200  Psych Admission Type (Psych Patients Only)  Admission Status Voluntary  Psychosocial Assessment  Patient Complaints None  Eye Contact Fair  Facial Expression Sad  Affect Sad  Speech Soft  Interaction Childlike  Motor Activity Slow  Appearance/Hygiene Improved  Behavior Characteristics Cooperative;Calm;Guarded  Mood Depressed;Anxious  Aggressive Behavior  Effect No apparent injury  Thought Process  Coherency WDL  Content WDL  Delusions WDL  Perception WDL  Hallucination None reported or observed  Judgment Limited  Confusion None  Danger to Self  Current suicidal ideation? Denies  Danger to Others  Danger to Others None reported or observed

## 2022-12-29 NOTE — Progress Notes (Signed)
Patient ID: Yolanda [name], female   DOB: Oct 02, 1975, 47 y.o.   MRN: [id number] [facility] MD Progress Note  12/29/2022 8:50 AM Yolanda [name]  MRN:  [id number]  Reason for admission: This is the first psychiatric admission in this [facility] in 12 years for this 47 AA female with an extensive hx of mental illnesses & probable polysubstance use disorders. She is admitted to the [facility] from the [facility] hospital with complain of worsening suicidal ideations with plan to stab herself. Per chart review, patient apparently reported at the ED that she has been depressed for a while & has not been taking her mental health medications. After medical evaluation.clearance, she was transferred to the [facility] for further psychiatric evaluation/treatments.   Patient seen and assessed in milieu. NAD. During evaluation she was flat and depressed. She reports elevated anxiety and is uncertain the cause. She states she will work on identifying etiologies of depression.    Sleep and appetite are fair.  Stated that her appetite is intact.  She is agreeable to increase in zoloft tomorrow. She denied medication side effects at this time.  Today, he denied SI/HI/AVH, paranoia.   Review of Systems  Respiratory:  Negative for shortness of breath.   Cardiovascular:  Negative for chest pain.  Gastrointestinal:  Negative for nausea and vomiting.  Neurological:  Negative for dizziness and headaches.     Principal Problem: Schizoaffective disorder (HCC)   Diagnosis: Principal Problem:   Schizoaffective disorder (HCC) Active Problems:   MDD (major depressive disorder)  Total Time spent with patient: see attending attestation  Past Psychiatric History: See H&P.  Past Medical History:  Past Medical History:  Diagnosis Date   Bipolar affect, depressed (HCC)    Depression     Past Surgical History:  Procedure Laterality Date   NO PAST SURGERIES     SALPINGECTOMY     Family History: History reviewed. No pertinent  family history.  Family Psychiatric  History: See H&P.  Social History:  Social History   Substance and Sexual Activity  Alcohol Use Yes     Social History   Substance and Sexual Activity  Drug Use Yes   Types: Cocaine, Marijuana    Social History   Socioeconomic History   Marital status: Single    Spouse name: Not on file   Number of children: Not on file   Years of education: Not on file   Highest education level: Not on file  Occupational History   Not on file  Tobacco Use   Smoking status: Every Day   Smokeless tobacco: Not on file  Substance and Sexual Activity   Alcohol use: Yes   Drug use: Yes    Types: Cocaine, Marijuana   Sexual activity: Yes  Other Topics Concern   Not on file  Social History Narrative   Not on file   Social Determinants of Health   Financial Resource Strain: Not on file  Food Insecurity: Patient Declined (12/20/2022)   Hunger Vital Sign    Worried About Running Out of Food in the Last Year: Patient declined    Ran Out of Food in the Last Year: Patient declined  Transportation Needs: No Transportation Needs (12/20/2022)   PRAPARE - Administrator, Civil Service (Medical): No    Lack of Transportation (Non-Medical): No  Physical Activity: Not on file  Stress: Not on file  Social Connections: Not on file   Additional Social History:   Sleep: Good  Appetite:  Good  Current Medications: Current Facility-Administered Medications  Medication Dose Route Frequency Provider Last Rate Last Admin   acetaminophen (TYLENOL) tablet 650 mg  650 mg Oral Q6H PRN [name], NP       alum & mag hydroxide-simeth (MAALOX/MYLANTA) 200-200-20 MG/5ML suspension 30 mL  30 mL Oral Q4H PRN [name], NP       busPIRone (BUSPAR) tablet 15 mg  15 mg Oral BID [name], NP   15 mg at 12/29/22 9604   diphenhydrAMINE (BENADRYL) capsule 50 mg  50 mg Oral TID PRN [name], NP       Or   diphenhydrAMINE (BENADRYL) injection 50 mg   50 mg Intramuscular TID PRN [name], NP       haloperidol (HALDOL) tablet 5 mg  5 mg Oral TID PRN [name], NP       Or   haloperidol lactate (HALDOL) injection 5 mg  5 mg Intramuscular TID PRN [name], NP       hydrOXYzine (ATARAX) tablet 25 mg  25 mg Oral TID PRN [name], DO       LORazepam (ATIVAN) tablet 2 mg  2 mg Oral TID PRN [name], NP       Or   LORazepam (ATIVAN) injection 2 mg  2 mg Intramuscular TID PRN [name], NP       magnesium hydroxide (MILK OF MAGNESIA) suspension 30 mL  30 mL Oral Daily PRN [name], NP       nicotine (NICODERM CQ - dosed in mg/24 hours) patch 21 mg  21 mg Transdermal Daily [name], DO       paliperidone (INVEGA) 24 hr tablet 3 mg  3 mg Oral Daily Nwoko, Agnes I, NP   3 mg at 12/29/22 0804   pantoprazole (PROTONIX) EC tablet 40 mg  40 mg Oral Daily [name], NP   40 mg at 12/29/22 0804   sertraline (ZOLOFT) tablet 75 mg  75 mg Oral Daily [name], DO   75 mg at 12/29/22 0805   traZODone (DESYREL) tablet 100 mg  100 mg Oral QHS PRN [name], NP       Lab Results:  No results found for this or any previous visit (from the past 48 hour(s)).  Blood Alcohol level:  Lab Results  Component Value Date   ETH <10 12/19/2022   ETH <10 11/21/2019   Metabolic Disorder Labs: Lab Results  Component Value Date   HGBA1C 4.4 (L) 12/21/2022   MPG 79.58 12/21/2022   No results found for: "PROLACTIN" Lab Results  Component Value Date   CHOL 218 (H) 12/21/2022   TRIG 81 12/21/2022   HDL 53 12/21/2022   CHOLHDL 4.1 12/21/2022   VLDL 16 12/21/2022   LDLCALC 149 (H) 12/21/2022   LDLCALC 110 (H) 03/13/2011   Physical Findings: AIMS:  , ,  ,  ,    CIWA:    COWS:     Musculoskeletal: Strength & Muscle Tone: within normal limits Gait & Station: normal Patient leans: N/A  Psychiatric Specialty Exam:  Presentation  General Appearance:  Appropriate for Environment; Casual  Eye  Contact: Minimal  Speech: Slow  Speech Volume: Decreased  Handedness: Right  Mood and Affect  Mood: Depressed; Anxious  Affect: Restricted  Thought Process  Thought Processes: Coherent; Goal Directed  Descriptions of Associations:Intact  Orientation:Full (Time, Place and Person)  Thought Content:Logical  History of Schizophrenia/Schizoaffective disorder:Yes  Duration of  Psychotic Symptoms:Greater than six months  Hallucinations:Hallucinations: None  Ideas of Reference:None  Suicidal Thoughts:Suicidal Thoughts: No  Homicidal Thoughts:Homicidal Thoughts: No  Sensorium  Memory: Remote Good  Judgment: Fair  Insight: Shallow  Executive Functions  Concentration: Good  Attention Span: Good  Recall: Good  Fund of Knowledge: Good  Language: Good  Psychomotor Activity  Psychomotor Activity: Psychomotor Activity: Decreased; Psychomotor Retardation  Assets  Assets: Communication Skills; Desire for Improvement  Sleep  Sleep: Sleep: Fair  Physical Exam: Physical Exam Vitals and nursing note reviewed.  Constitutional:      General: She is not in acute distress.    Appearance: She is not ill-appearing or diaphoretic.  HENT:     Head: Normocephalic.     Nose: No congestion.  Pulmonary:     Effort: Pulmonary effort is normal. No respiratory distress.  Neurological:     Mental Status: She is alert and oriented to person, place, and time.    Blood pressure 109/73, pulse 97, temperature 97.6 F (36.4 C), resp. rate 16, height 4\' 11"  (1.499 m), weight 63 kg, SpO2 100 %. Body mass index is 28.05 kg/m.  Treatment Plan Summary: Daily contact with patient to assess and evaluate symptoms and progress in treatment and Medication management.   Continue inpatient hospitalization.  Will continue today 12/29/2022 plan as below except where it is noted.   Principal/active diagnoses:  Schizoaffective disorder, bipolar type.    Associated  symptoms.  Angry outburst. Suicidal ideations.  Crying spell.  Irritability.  Plan: Re-initiated:  -Continue Buspar 15 mg po bid for anxiety.  -Continue paliperidone 3 mg po daily for mood control.  -Increase Sertraline 75 mg to 100 mg po Q daily for depression. -Continue Trazodone 100 mg po Q bedtime prn for insomnia.  -Continue Nicoderm topically Q 24 hrs for nicotine withdrawal management.   Agitation protocols: Cont as recommended;  -Benadryl 50 mg po or IM tid prn. -Haldol 5 mg po or IM tid prn.  -Lorazepam 2 mg po or IM tid prn.   Other PRNS -Continue Tylenol 650 mg every 6 hours PRN for mild pain -Continue Maalox 30 ml Q 4 hrs PRN for indigestion -Continue MOM 30 ml po Q 6 hrs for constipation   Safety and Monitoring: Voluntary admission to inpatient psychiatric unit for safety, stabilization and treatment Daily contact with patient to assess and evaluate symptoms and progress in treatment Patient's case to be discussed in multi-disciplinary team meeting Observation Level : q15 minute checks Vital signs: q12 hours Precautions: Safety   Discharge Planning: Social work and case management to assist with discharge planning and identification of hospital follow-up needs prior to discharge Estimated LOS: 5-7 days Discharge Concerns: Need to establish a safety plan; Medication compliance and effectiveness Discharge Goals: Return home with outpatient referrals for mental health follow-up including medication management/psychotherapy  Park Pope, MD 12/29/2022, 8:50 AM  Patient ID: Yolanda [name], female   DOB: [birth year], 47 y.o.   MRN: [id number] Patient ID: Yolanda [name], female   DOB: 1975/10/26, 47 y.o.   MRN: [id number] Patient ID: Yolanda [name], female   DOB: Aug 07, 1975, 47 y.o.   MRN: [id number] Patient ID: Yolanda [name], female   DOB: 04-25-1976, 47 y.o.   MRN: [id number] Patient ID: Yolanda [name], female   DOB: 30-Dec-1975, 47 y.o.   MRN: [id number]

## 2022-12-29 NOTE — Progress Notes (Signed)
   12/29/22 0600  15 Minute Checks  Location Bedroom  Visual Appearance Calm  Behavior Composed  Sleep (Behavioral Health Patients Only)  Calculate sleep? (Click Yes once per 24 hr at 0600 safety check) Yes  Documented sleep last 24 hours 10

## 2022-12-29 NOTE — BHH Group Notes (Signed)
Pt did not attend goals group. 

## 2022-12-29 NOTE — Progress Notes (Signed)
   12/29/22 2157  Psych Admission Type (Psych Patients Only)  Admission Status Voluntary  Psychosocial Assessment  Patient Complaints None  Eye Contact Fair  Facial Expression Flat  Affect Appropriate to circumstance  Speech Logical/coherent;Soft  Interaction Childlike  Motor Activity Slow  Appearance/Hygiene Disheveled  Behavior Characteristics Appropriate to situation  Mood Pleasant  Thought Process  Coherency WDL  Content WDL  Delusions None reported or observed  Perception WDL  Hallucination None reported or observed  Judgment Limited  Confusion None  Danger to Self  Current suicidal ideation? Denies  Self-Injurious Behavior No self-injurious ideation or behavior indicators observed or expressed   Agreement Not to Harm Self Yes  Description of Agreement verbal  Danger to Others  Danger to Others None reported or observed

## 2022-12-30 NOTE — Group Note (Signed)
Date:  12/30/2022 Time:  4:57 PM  Group Topic/Focus:  Wellness Toolbox:   The focus of this group is to discuss various aspects of wellness, balancing those aspects and exploring ways to increase the ability to experience wellness.  Patients will create a wellness toolbox for use upon discharge.    Participation Level:  Did Not Attend  Participation Quality:      Affect:      Cognitive:      Insight: None  Engagement in Group:    Modes of Intervention:      Additional Comments:     [name] 12/30/2022, 4:57 PM

## 2022-12-30 NOTE — Progress Notes (Signed)
   12/30/22 2258  Psych Admission Type (Psych Patients Only)  Admission Status Voluntary  Psychosocial Assessment  Patient Complaints None  Eye Contact Fair  Facial Expression Other (Comment) (appropriate)  Affect Appropriate to circumstance  Speech Logical/coherent  Interaction Childlike  Motor Activity Slow  Appearance/Hygiene Improved  Behavior Characteristics Appropriate to situation  Mood Pleasant  Thought Process  Coherency WDL  Content WDL  Delusions None reported or observed  Perception WDL  Hallucination None reported or observed  Judgment Limited  Confusion None  Danger to Self  Current suicidal ideation? Denies  Self-Injurious Behavior No self-injurious ideation or behavior indicators observed or expressed   Agreement Not to Harm Self Yes  Description of Agreement verbal  Danger to Others  Danger to Others None reported or observed

## 2022-12-30 NOTE — Progress Notes (Signed)
Adult Psychoeducational Group Note  Date:  12/30/2022 Time:  8:59 PM  Group Topic/Focus:  Wrap-Up Group:   The focus of this group is to help patients review their daily goal of treatment and discuss progress on daily workbooks.  Participation Level:  Did Not Attend  Participation Quality:  Appropriate  Affect:  Anxious  Cognitive:  Appropriate  Insight: None  Engagement in Group:  None  Modes of Intervention:  Discussion  Additional Comments:  Pt . Did not attend group  Joselyn Arrow 12/30/2022, 8:59 PM

## 2022-12-30 NOTE — Progress Notes (Signed)
   12/30/22 0900  Psych Admission Type (Psych Patients Only)  Admission Status Voluntary  Psychosocial Assessment  Patient Complaints None  Eye Contact Fair  Facial Expression Sad  Affect Appropriate to circumstance  Speech Soft  Interaction Childlike  Motor Activity Slow  Appearance/Hygiene Disheveled  Behavior Characteristics Cooperative;Appropriate to situation  Mood Depressed;Pleasant  Aggressive Behavior  Effect No apparent injury  Thought Process  Coherency WDL  Content WDL  Delusions None reported or observed  Perception WDL  Hallucination None reported or observed  Judgment Limited  Confusion None  Danger to Self  Current suicidal ideation? Denies  Self-Injurious Behavior No self-injurious ideation or behavior indicators observed or expressed   Agreement Not to Harm Self Yes  Description of Agreement agrees to contact staff before acting on harmful thoughts  Danger to Others  Danger to Others None reported or observed

## 2022-12-30 NOTE — [facility] Group Notes (Signed)
LCSW Wellness Group Note   12/30/2022 09:30am  Type of Group and Topic: Psychoeducational Group:  Wellness  Participation Level:  did not attend  Description of Group  Wellness group introduces the topic and its focus on developing healthy habits across the spectrum and its relationship to a decrease in hospital admissions.  Six areas of wellness are discussed: physical, social spiritual, intellectual, occupational, and emotional.  Patients are asked to consider their current wellness habits and to identify areas of wellness where they are interested and able to focus on improvements.    Therapeutic Goals Patients will understand components of wellness and how they can positively impact overall health.  Patients will identify areas of wellness where they have developed good habits. Patients will identify areas of wellness where they would like to make improvements.    Summary of Patient Progress     Therapeutic Modalities: Cognitive Behavioral Therapy Psychoeducation    Bram Hottel Jon, LCSW  

## 2022-12-30 NOTE — Progress Notes (Signed)
Patient ID: Yolanda [name], female   DOB: 11-07-1975, 47 y.o.   MRN: [id number] [facility] MD Progress Note  12/30/2022 7:35 AM Yolanda [name]  MRN:  [id number]  Reason for admission: This is the first psychiatric admission in this [facility] in 12 years for this 89 AA female with an extensive hx of mental illnesses & probable polysubstance use disorders. She is admitted to the [facility] from the [facility] hospital with complain of worsening suicidal ideations with plan to stab herself. Per chart review, patient apparently reported at the ED that she has been depressed for a while & has not been taking her mental health medications. After medical evaluation.clearance, she was transferred to the [facility] for further psychiatric evaluation/treatments.   Patient seen and assessed in milieu. NAD. During evaluation, her affect was much more animated. She reports feeling less depressed and anxious compared to yesterday. She thinks the medications helped with her mood.   Sleep and appetite are fair.  Stated that her appetite is intact.  She is agreeable to increase in zoloft tomorrow. She denied medication side effects at this time.  Today, he denied SI/HI/AVH, paranoia.   Review of Systems  Respiratory:  Negative for shortness of breath.   Cardiovascular:  Negative for chest pain.  Gastrointestinal:  Negative for nausea and vomiting.  Neurological:  Negative for dizziness and headaches.     Principal Problem: Schizoaffective disorder (HCC)   Diagnosis: Principal Problem:   Schizoaffective disorder (HCC) Active Problems:   MDD (major depressive disorder)  Total Time spent with patient: see attending attestation  Past Psychiatric History: See H&P.  Past Medical History:  Past Medical History:  Diagnosis Date   Bipolar affect, depressed (HCC)    Depression     Past Surgical History:  Procedure Laterality Date   NO PAST SURGERIES     SALPINGECTOMY     Family History: History reviewed. No pertinent  family history.  Family Psychiatric  History: See H&P.  Social History:  Social History   Substance and Sexual Activity  Alcohol Use Yes     Social History   Substance and Sexual Activity  Drug Use Yes   Types: Cocaine, Marijuana    Social History   Socioeconomic History   Marital status: Single    Spouse name: Not on file   Number of children: Not on file   Years of education: Not on file   Highest education level: Not on file  Occupational History   Not on file  Tobacco Use   Smoking status: Every Day   Smokeless tobacco: Not on file  Substance and Sexual Activity   Alcohol use: Yes   Drug use: Yes    Types: Cocaine, Marijuana   Sexual activity: Yes  Other Topics Concern   Not on file  Social History Narrative   Not on file   Social Determinants of Health   Financial Resource Strain: Not on file  Food Insecurity: Patient Declined (12/20/2022)   Hunger Vital Sign    Worried About Running Out of Food in the Last Year: Patient declined    Ran Out of Food in the Last Year: Patient declined  Transportation Needs: No Transportation Needs (12/20/2022)   PRAPARE - Administrator, Civil Service (Medical): No    Lack of Transportation (Non-Medical): No  Physical Activity: Not on file  Stress: Not on file  Social Connections: Not on file   Additional Social History:   Sleep: Good  Appetite:  Good  Current Medications: Current Facility-Administered Medications  Medication Dose Route Frequency Provider Last Rate Last Admin   acetaminophen (TYLENOL) tablet 650 mg  650 mg Oral Q6H PRN [name], NP   650 mg at 12/29/22 1600   alum & mag hydroxide-simeth (MAALOX/MYLANTA) 200-200-20 MG/5ML suspension 30 mL  30 mL Oral Q4H PRN [name], NP       busPIRone (BUSPAR) tablet 15 mg  15 mg Oral BID [name], NP   15 mg at 12/29/22 2135   diphenhydrAMINE (BENADRYL) capsule 50 mg  50 mg Oral TID PRN [name], NP       Or   diphenhydrAMINE  (BENADRYL) injection 50 mg  50 mg Intramuscular TID PRN [name], NP       haloperidol (HALDOL) tablet 5 mg  5 mg Oral TID PRN [name], NP       Or   haloperidol lactate (HALDOL) injection 5 mg  5 mg Intramuscular TID PRN [name], NP       hydrOXYzine (ATARAX) tablet 25 mg  25 mg Oral TID PRN [name], DO       LORazepam (ATIVAN) tablet 2 mg  2 mg Oral TID PRN [name], NP       Or   LORazepam (ATIVAN) injection 2 mg  2 mg Intramuscular TID PRN [name], NP       magnesium hydroxide (MILK OF MAGNESIA) suspension 30 mL  30 mL Oral Daily PRN [name], NP       nicotine (NICODERM CQ - dosed in mg/24 hours) patch 21 mg  21 mg Transdermal Daily [name], DO       paliperidone (INVEGA) 24 hr tablet 3 mg  3 mg Oral Daily Nwoko, Agnes I, NP   3 mg at 12/29/22 0804   pantoprazole (PROTONIX) EC tablet 40 mg  40 mg Oral Daily [name], NP   40 mg at 12/29/22 0804   sertraline (ZOLOFT) tablet 100 mg  100 mg Oral Daily Park Pope, MD       traZODone (DESYREL) tablet 100 mg  100 mg Oral QHS PRN [name], NP   100 mg at 12/29/22 2136   Lab Results:  No results found for this or any previous visit (from the past 48 hour(s)).  Blood Alcohol level:  Lab Results  Component Value Date   ETH <10 12/19/2022   ETH <10 11/21/2019   Metabolic Disorder Labs: Lab Results  Component Value Date   HGBA1C 4.4 (L) 12/21/2022   MPG 79.58 12/21/2022   No results found for: "PROLACTIN" Lab Results  Component Value Date   CHOL 218 (H) 12/21/2022   TRIG 81 12/21/2022   HDL 53 12/21/2022   CHOLHDL 4.1 12/21/2022   VLDL 16 12/21/2022   LDLCALC 149 (H) 12/21/2022   LDLCALC 110 (H) 03/13/2011   Physical Findings: AIMS:  , ,  ,  ,    CIWA:    COWS:     Musculoskeletal: Strength & Muscle Tone: within normal limits Gait & Station: normal Patient leans: N/A  Psychiatric Specialty Exam:  Presentation  General Appearance:  Appropriate for Environment;  Casual  Eye Contact: Minimal  Speech: Slow  Speech Volume: Decreased  Handedness: Right  Mood and Affect  Mood: Depressed; Anxious  Affect: Restricted  Thought Process  Thought Processes: Coherent; Goal Directed  Descriptions of Associations:Intact  Orientation:Full (Time, Place and Person)  Thought Content:Logical  History of Schizophrenia/Schizoaffective disorder:Yes  Duration  of Psychotic Symptoms:Greater than six months  Hallucinations:Hallucinations: None  Ideas of Reference:None  Suicidal Thoughts:Suicidal Thoughts: No  Homicidal Thoughts:Homicidal Thoughts: No  Sensorium  Memory: Remote Good  Judgment: Fair  Insight: Shallow  Executive Functions  Concentration: Good  Attention Span: Good  Recall: Good  Fund of Knowledge: Good  Language: Good  Psychomotor Activity  Psychomotor Activity: Psychomotor Activity: Decreased; Psychomotor Retardation  Assets  Assets: Communication Skills; Desire for Improvement  Sleep  Sleep: Sleep: Fair  Physical Exam: Physical Exam Vitals and nursing note reviewed.  Constitutional:      General: She is not in acute distress.    Appearance: She is not ill-appearing or diaphoretic.  HENT:     Head: Normocephalic.     Nose: No congestion.  Pulmonary:     Effort: Pulmonary effort is normal. No respiratory distress.  Neurological:     Mental Status: She is alert and oriented to person, place, and time.    Blood pressure 94/66, pulse (!) 106, temperature 97.8 F (36.6 C), temperature source Oral, resp. rate 16, height 4\' 11"  (1.499 m), weight 63 kg, SpO2 100 %. Body mass index is 28.05 kg/m.  Treatment Plan Summary: Daily contact with patient to assess and evaluate symptoms and progress in treatment and Medication management.   Continue inpatient hospitalization.  Will continue today 12/30/2022 plan as below except where it is noted.   Principal/active diagnoses:  Schizoaffective  disorder, bipolar type.    Associated symptoms.  Angry outburst. Suicidal ideations.  Crying spell.  Irritability.  Plan: Re-initiated:  -Continue Buspar 15 mg po bid for anxiety.  -Continue paliperidone 3 mg po daily for mood control.  -Increase Sertraline 75 mg to 100 mg po Q daily for depression. -Continue Trazodone 100 mg po Q bedtime prn for insomnia.  -Continue Nicoderm topically Q 24 hrs for nicotine withdrawal management.   Agitation protocols: Cont as recommended;  -Benadryl 50 mg po or IM tid prn. -Haldol 5 mg po or IM tid prn.  -Lorazepam 2 mg po or IM tid prn.   Other PRNS -Continue Tylenol 650 mg every 6 hours PRN for mild pain -Continue Maalox 30 ml Q 4 hrs PRN for indigestion -Continue MOM 30 ml po Q 6 hrs for constipation   Safety and Monitoring: Voluntary admission to inpatient psychiatric unit for safety, stabilization and treatment Daily contact with patient to assess and evaluate symptoms and progress in treatment Patient's case to be discussed in multi-disciplinary team meeting Observation Level : q15 minute checks Vital signs: q12 hours Precautions: Safety   Discharge Planning: Social work and case management to assist with discharge planning and identification of hospital follow-up needs prior to discharge Estimated LOS: 5-7 days Discharge Concerns: Need to establish a safety plan; Medication compliance and effectiveness Discharge Goals: Return home with outpatient referrals for mental health follow-up including medication management/psychotherapy  Park Pope, MD 12/30/2022, 7:35 AM  Patient ID: Yolanda [name], female   DOB: 1975-10-11, 47 y.o.   MRN: [id number] Patient ID: Yolanda [name], female   DOB: 1975-08-07, 47 y.o.   MRN: [id number] Patient ID: Yolanda [name], female   DOB: Aug 17, 1975, 47 y.o.   MRN: [id number] Patient ID: Yolanda [name], female   DOB: September 09, 1975, 47 y.o.   MRN: [id number] Patient ID: Yolanda [name], female   DOB:  1976/05/29, 47 y.o.   MRN: [id number]

## 2022-12-30 NOTE — Group Note (Unsigned)
Date:  12/30/2022 Time:  9:24 AM  Group Topic/Focus:  Orientation:   The focus of this group is to educate the patient on the purpose and policies of crisis stabilization and provide a format to answer questions about their admission.  The group details unit policies and expectations of patients while admitted.     Participation Level:  {B[facility] PARTICIPATION LEVEL:22264}  Participation Quality:  {B[facility] PARTICIPATION QUALITY:22265}  Affect:  {B[facility] AFFECT:22266}  Cognitive:  {B[facility] COGNITIVE:22267}  Insight: {BHH Insight2:20797}  Engagement in Group:  {B[facility] ENGAGEMENT IN GROUP:22268}  Modes of Intervention:  {BHH MODES OF INTERVENTION:22269}  Additional Comments:  ***  Saja Bartolini D Crosby Oriordan 12/30/2022, 9:24 AM  

## 2022-12-31 ENCOUNTER — Encounter (HOSPITAL_COMMUNITY): Payer: Self-pay

## 2022-12-31 NOTE — Group Note (Signed)
Recreation Therapy Group Note   Group Topic:Problem Solving  Group Date: 12/31/2022 Start Time: 0930 End Time: 1000 Facilitators: Naomee Nowland-McCall, LRT,CTRS Location: [address] Dayroom   Goal Area(s) Addresses:  Patient will effectively work with peer towards shared goal.  Patient will identify skills used to make activity successful.  Patient will share challenges and verbalize solution-driven approaches used. Patient will identify how skills used during activity can be used to reach post d/c goals.   Group Description: [employer]. Patients were provided the following materials: 4 drinking straws, 5 rubber bands, 5 paper clips, 2 index cards and 2 drinking cups. Using the provided materials patients were asked to build a launching mechanism to launch a ping pong ball across the room, approximately 10 feet. Patients were divided into teams of 3-5. Instructions required all materials be incorporated into the device, functionality of items left to the peer group's discretion.   Affect/Mood: Appropriate   Participation Level: None   Participation Quality: None   Behavior: On-looking   Speech/Thought Process: None   Insight: None   Judgement: None   Modes of Intervention: STEM Activity   Patient Response to Interventions:  Attentive   Education Outcome:  Acknowledges education   Clinical Observations/Individualized Feedback: Pt was in group for the last few minutes.  Pt observed for the last few minutes.     Plan: Continue to engage patient in RT group sessions 2-3x/week.   Nicolina Hirt-McCall, LRT,CTRS 12/31/2022 12:28 PM

## 2022-12-31 NOTE — [facility] Counselor (Signed)
CSW Yolanda [name] spoke with Ms. Edsel Petrin at [facility] [employer] and filed a APS report on 12-31-2022 @ [phone]. CSW is awaiting a call to determine if this case will be substantiated.

## 2022-12-31 NOTE — Progress Notes (Signed)
   12/31/22 1700  Psych Admission Type (Psych Patients Only)  Admission Status Voluntary  Psychosocial Assessment  Patient Complaints None  Eye Contact Fair  Facial Expression Animated  Affect Appropriate to circumstance  Speech Soft  Interaction Childlike  Motor Activity Slow  Appearance/Hygiene Improved  Behavior Characteristics Cooperative;Appropriate to situation  Mood Pleasant  Aggressive Behavior  Effect No apparent injury  Thought Process  Coherency WDL  Content WDL  Delusions None reported or observed  Perception WDL  Hallucination None reported or observed  Judgment Limited  Confusion None  Danger to Self  Current suicidal ideation? Denies  Self-Injurious Behavior No self-injurious ideation or behavior indicators observed or expressed   Danger to Others  Danger to Others None reported or observed

## 2022-12-31 NOTE — Progress Notes (Addendum)
Patient ID: Yolanda [name], female   DOB: Jul 26, 1975, 47 y.o.   MRN: [id number] [facility] MD Progress Note  12/31/2022 8:51 AM Yolanda [name]  MRN:  [id number]  Reason for admission: This is the first psychiatric admission in this [facility] in 12 years for this 47 AA female with an extensive hx of mental illnesses & probable polysubstance use disorders. She is admitted to the [facility] from the [facility] with complain of worsening suicidal ideations with plan to stab herself. Per chart review, patient apparently reported at the ED that she has been depressed for a while & has not been taking her mental health medications. After medical evaluation.clearance, she was transferred to the [facility] for further psychiatric evaluation/treatments.   Patient was seen laying in bed, reading her bible. No acute distress, comfortable.  During evaluation, she was calm, vague, shallowly engaged.  Reported still feeling "sad".  No SI, last time was when she came in. She denied medication side effects after increasing rx.  Her sleep and appetite are stable and appropriate.  She became tearful when asked what her next steps are.   Today, she denied SI/HI/AVH, paranoia.   Review of Systems  Respiratory:  Negative for shortness of breath.   Cardiovascular:  Negative for chest pain.  Gastrointestinal:  Negative for nausea and vomiting.  Neurological:  Negative for dizziness and headaches.     Principal Problem: Schizoaffective disorder (HCC)   Diagnosis: Principal Problem:   Schizoaffective disorder (HCC) Active Problems:   MDD (major depressive disorder)  Total Time spent with patient: see attending attestation  Past Psychiatric History: See H&P.  Past Medical History:  Past Medical History:  Diagnosis Date   Bipolar affect, depressed (HCC)    Depression     Past Surgical History:  Procedure Laterality Date   NO PAST SURGERIES     SALPINGECTOMY     Family History: History reviewed. No pertinent  family history.  Family Psychiatric  History: See H&P.  Social History:  Social History   Substance and Sexual Activity  Alcohol Use Yes     Social History   Substance and Sexual Activity  Drug Use Yes   Types: Cocaine, Marijuana    Social History   Socioeconomic History   Marital status: Single    Spouse name: Not on file   Number of children: Not on file   Years of education: Not on file   Highest education level: Not on file  Occupational History   Not on file  Tobacco Use   Smoking status: Every Day   Smokeless tobacco: Not on file  Substance and Sexual Activity   Alcohol use: Yes   Drug use: Yes    Types: Cocaine, Marijuana   Sexual activity: Yes  Other Topics Concern   Not on file  Social History Narrative   Not on file   Social Determinants of Health   Financial Resource Strain: Not on file  Food Insecurity: Patient Declined (12/20/2022)   Hunger Vital Sign    Worried About Running Out of Food in the Last Year: Patient declined    Ran Out of Food in the Last Year: Patient declined  Transportation Needs: No Transportation Needs (12/20/2022)   PRAPARE - Administrator, Civil Service (Medical): No    Lack of Transportation (Non-Medical): No  Physical Activity: Not on file  Stress: Not on file  Social Connections: Not on file   Current Medications: Current Facility-Administered Medications  Medication Dose Route  Frequency Provider Last Rate Last Admin   acetaminophen (TYLENOL) tablet 650 mg  650 mg Oral Q6H PRN [name], NP   650 mg at 12/29/22 1600   alum & mag hydroxide-simeth (MAALOX/MYLANTA) 200-200-20 MG/5ML suspension 30 mL  30 mL Oral Q4H PRN [name], NP       busPIRone (BUSPAR) tablet 15 mg  15 mg Oral BID [name], NP   15 mg at 12/31/22 0749   diphenhydrAMINE (BENADRYL) capsule 50 mg  50 mg Oral TID PRN [name], NP       Or   diphenhydrAMINE (BENADRYL) injection 50 mg  50 mg Intramuscular TID PRN [name],  NP       haloperidol (HALDOL) tablet 5 mg  5 mg Oral TID PRN [name], NP       Or   haloperidol lactate (HALDOL) injection 5 mg  5 mg Intramuscular TID PRN [name], NP       hydrOXYzine (ATARAX) tablet 25 mg  25 mg Oral TID PRN [name], DO       LORazepam (ATIVAN) tablet 2 mg  2 mg Oral TID PRN [name], NP       Or   LORazepam (ATIVAN) injection 2 mg  2 mg Intramuscular TID PRN [name], NP       magnesium hydroxide (MILK OF MAGNESIA) suspension 30 mL  30 mL Oral Daily PRN [name], NP       nicotine (NICODERM CQ - dosed in mg/24 hours) patch 21 mg  21 mg Transdermal Daily [name], DO       paliperidone (INVEGA) 24 hr tablet 3 mg  3 mg Oral Daily Nwoko, Nicole Kindred I, NP   3 mg at 12/31/22 0749   pantoprazole (PROTONIX) EC tablet 40 mg  40 mg Oral Daily [name], NP   40 mg at 12/31/22 0749   sertraline (ZOLOFT) tablet 100 mg  100 mg Oral Daily Park Pope, MD   100 mg at 12/31/22 0748   traZODone (DESYREL) tablet 100 mg  100 mg Oral QHS PRN [name], NP   100 mg at 12/30/22 2052   Lab Results:  No results found for this or any previous visit (from the past 48 hour(s)).  Blood Alcohol level:  Lab Results  Component Value Date   ETH <10 12/19/2022   ETH <10 11/21/2019   Metabolic Disorder Labs: Lab Results  Component Value Date   HGBA1C 4.4 (L) 12/21/2022   MPG 79.58 12/21/2022   No results found for: "PROLACTIN" Lab Results  Component Value Date   CHOL 218 (H) 12/21/2022   TRIG 81 12/21/2022   HDL 53 12/21/2022   CHOLHDL 4.1 12/21/2022   VLDL 16 12/21/2022   LDLCALC 149 (H) 12/21/2022   LDLCALC 110 (H) 03/13/2011   Physical Findings: AIMS:  , ,  ,  ,    CIWA:    COWS:     Musculoskeletal: Strength & Muscle Tone: within normal limits Gait & Station: normal Patient leans: N/A  Psychiatric Specialty Exam:  Presentation  General Appearance:  Appropriate for Environment; Casual  Eye Contact: Fair  Speech: Clear and  Coherent; Normal Rate  Speech Volume: Decreased  Handedness: Right  Mood and Affect  Mood: Depressed  Affect: Congruent; Appropriate; Depressed; Restricted; Tearful  Thought Process  Thought Processes: Coherent; Goal Directed; Linear  Descriptions of Associations:Intact  Orientation:Full (Time, Place and Person)  Thought Content:Rumination; Perseveration  History of Schizophrenia/Schizoaffective  disorder:Yes  Duration of Psychotic Symptoms:Greater than six months  Hallucinations:Hallucinations: None   Ideas of Reference:None  Suicidal Thoughts:Suicidal Thoughts: No   Homicidal Thoughts:Homicidal Thoughts: No   Sensorium  Memory: Immediate Good  Judgment: Impaired  Insight: Shallow  Executive Functions  Concentration: Good  Attention Span: Good  Recall: Good  Fund of Knowledge: Good  Language: Good  Psychomotor Activity  Psychomotor Activity: Psychomotor Activity: Decreased; Psychomotor Retardation   Assets  Assets: Communication Skills; Desire for Improvement  Sleep  Sleep: Sleep: Good   Physical Exam: Physical Exam Vitals and nursing note reviewed.  Constitutional:      General: She is not in acute distress.    Appearance: She is not ill-appearing or diaphoretic.  HENT:     Head: Normocephalic.     Nose: No congestion.  Pulmonary:     Effort: Pulmonary effort is normal. No respiratory distress.  Neurological:     Mental Status: She is alert and oriented to person, place, and time.    Blood pressure 94/74, pulse (!) 114, temperature 98.2 F (36.8 C), temperature source Oral, resp. rate 16, height 4\' 11"  (1.499 m), weight 63 kg, SpO2 99 %. Body mass index is 28.05 kg/m.  Treatment Plan Summary: Daily contact with patient to assess and evaluate symptoms and progress in treatment and Medication management.   Continue inpatient hospitalization.  Will continue today 12/31/2022 plan as below except where it is noted.    Principal/active diagnoses:  Schizoaffective disorder, bipolar type.    Associated symptoms.  Angry outburst. Suicidal ideations.  Crying spell.  Irritability.  Plan: Re-initiated:  -Continue Buspar 15 mg po bid for anxiety.  -Continue paliperidone 3 mg po daily for mood control -Continued Sertraline 100 mg po Q daily for depression -Continue Trazodone 100 mg po Q bedtime prn for insomnia -Continue Nicoderm topically Q 24 hrs for nicotine withdrawal management   Safety and Monitoring: Voluntary admission to inpatient psychiatric unit for safety, stabilization and treatment Daily contact with patient to assess and evaluate symptoms and progress in treatment Patient's case to be discussed in multi-disciplinary team meeting Observation Level : q15 minute checks Vital signs: q12 hours Precautions: Safety   Discharge Planning: Social work and case management to assist with discharge planning and identification of hospital follow-up needs prior to discharge Estimated LOS: 5-7 days Discharge Concerns: Need to establish a safety plan; Medication compliance and effectiveness Discharge Goals: Return home with outpatient referrals for mental health follow-up including medication management/psychotherapy Appreciate CSW for APS report   [name], DO Psych Resident, PGY-2 12/31/2022, 8:51 AM  Patient ID: Yolanda [name], female   DOB: 29-Jan-1976, 48 y.o.   MRN: [id number]

## 2022-12-31 NOTE — Group Note (Signed)
Date:  12/31/2022 Time:  10:01 AM  Group Topic/Focus:  Orientation:   The focus of this group is to educate the patient on the purpose and policies of crisis stabilization and provide a format to answer questions about their admission.  The group details unit policies and expectations of patients while admitted.    Participation Level:  Minimal  Participation Quality:  Attentive  Affect:  Appropriate  Cognitive:  Appropriate  Insight: Appropriate  Engagement in Group:  Engaged  Modes of Intervention:  Discussion  Additional Comments:     [name] 12/31/2022, 10:01 AM

## 2022-12-31 NOTE — BH IP Treatment Plan (Signed)
Interdisciplinary Treatment and Diagnostic Plan Update  12/31/2022 Time of Session: 9:35 Am ( UPDATE)  Yolanda [name] MRN: [id number]  Principal Diagnosis: Schizoaffective disorder (HCC)  Secondary Diagnoses: Principal Problem:   Schizoaffective disorder (HCC) Active Problems:   MDD (major depressive disorder)   Current Medications:  Current Facility-Administered Medications  Medication Dose Route Frequency Provider Last Rate Last Admin   acetaminophen (TYLENOL) tablet 650 mg  650 mg Oral Q6H PRN [name], NP   650 mg at 12/29/22 1600   alum & mag hydroxide-simeth (MAALOX/MYLANTA) 200-200-20 MG/5ML suspension 30 mL  30 mL Oral Q4H PRN [name], NP       busPIRone (BUSPAR) tablet 15 mg  15 mg Oral BID [name], NP   15 mg at 12/31/22 0749   diphenhydrAMINE (BENADRYL) capsule 50 mg  50 mg Oral TID PRN [name], NP       Or   diphenhydrAMINE (BENADRYL) injection 50 mg  50 mg Intramuscular TID PRN [name], NP       haloperidol (HALDOL) tablet 5 mg  5 mg Oral TID PRN [name], NP       Or   haloperidol lactate (HALDOL) injection 5 mg  5 mg Intramuscular TID PRN [name], NP       hydrOXYzine (ATARAX) tablet 25 mg  25 mg Oral TID PRN [name], DO       LORazepam (ATIVAN) tablet 2 mg  2 mg Oral TID PRN [name], NP       Or   LORazepam (ATIVAN) injection 2 mg  2 mg Intramuscular TID PRN [name], NP       magnesium hydroxide (MILK OF MAGNESIA) suspension 30 mL  30 mL Oral Daily PRN [name], NP       nicotine (NICODERM CQ - dosed in mg/24 hours) patch 21 mg  21 mg Transdermal Daily [name], DO       paliperidone (INVEGA) 24 hr tablet 3 mg  3 mg Oral Daily Nwoko, Agnes I, NP   3 mg at 12/31/22 0749   pantoprazole (PROTONIX) EC tablet 40 mg  40 mg Oral Daily [name], NP   40 mg at 12/31/22 0749   sertraline (ZOLOFT) tablet 100 mg  100 mg Oral Daily Park Pope, MD   100 mg at 12/31/22 0748   traZODone (DESYREL) tablet  100 mg  100 mg Oral QHS PRN [name], NP   100 mg at 12/30/22 2052   PTA Medications: Medications Prior to Admission  Medication Sig Dispense Refill Last Dose   busPIRone (BUSPAR) 15 MG tablet Take 15 mg by mouth 2 (two) times daily. (Patient not taking: Reported on 12/19/2022)      paliperidone (INVEGA SUSTENNA) 156 MG/ML SUSY injection Inject 156 mg into the muscle once. (Patient not taking: Reported on 12/19/2022)      sertraline (ZOLOFT) 50 MG tablet Take 150 mg by mouth daily. (Patient not taking: Reported on 12/19/2022)      traZODone (DESYREL) 100 MG tablet Take 100 mg by mouth at bedtime as needed for sleep. (Patient not taking: Reported on 11/12/2022)       Patient Stressors: Medication change or noncompliance    Patient Strengths: Forensic psychologist fund of knowledge   Treatment Modalities: Medication Management, Group therapy, Case management,  1 to 1 session with clinician, Psychoeducation, Recreational therapy.   Physician Treatment Plan for Primary Diagnosis: Schizoaffective disorder [facility]) Long Term Goal(s): Improvement  in symptoms so as ready for discharge   Short Term Goals: Ability to identify and develop effective coping behaviors will improve Ability to maintain clinical measurements within normal limits will improve Compliance with prescribed medications will improve Ability to identify triggers associated with substance abuse/mental health issues will improve Ability to identify changes in lifestyle to reduce recurrence of condition will improve Ability to verbalize feelings will improve Ability to disclose and discuss suicidal ideas Ability to demonstrate self-control will improve  Medication Management: Evaluate patient's response, side effects, and tolerance of medication regimen.  Therapeutic Interventions: 1 to 1 sessions, Unit Group sessions and Medication administration.  Evaluation of Outcomes: Progressing  Physician Treatment Plan for  Secondary Diagnosis: Principal Problem:   Schizoaffective disorder (HCC) Active Problems:   MDD (major depressive disorder)  Long Term Goal(s): Improvement in symptoms so as ready for discharge   Short Term Goals: Ability to identify and develop effective coping behaviors will improve Ability to maintain clinical measurements within normal limits will improve Compliance with prescribed medications will improve Ability to identify triggers associated with substance abuse/mental health issues will improve Ability to identify changes in lifestyle to reduce recurrence of condition will improve Ability to verbalize feelings will improve Ability to disclose and discuss suicidal ideas Ability to demonstrate self-control will improve     Medication Management: Evaluate patient's response, side effects, and tolerance of medication regimen.  Therapeutic Interventions: 1 to 1 sessions, Unit Group sessions and Medication administration.  Evaluation of Outcomes: Progressing   RN Treatment Plan for Primary Diagnosis: Schizoaffective disorder (HCC) Long Term Goal(s): Knowledge of disease and therapeutic regimen to maintain health will improve  Short Term Goals: Ability to remain free from injury will improve, Ability to verbalize frustration and anger appropriately will improve, Ability to participate in decision making will improve, Ability to verbalize feelings will improve, Ability to identify and develop effective coping behaviors will improve, and Compliance with prescribed medications will improve  Medication Management: RN will administer medications as ordered by provider, will assess and evaluate patient's response and provide education to patient for prescribed medication. RN will report any adverse and/or side effects to prescribing provider.  Therapeutic Interventions: 1 on 1 counseling sessions, Psychoeducation, Medication administration, Evaluate responses to treatment, Monitor vital  signs and CBGs as ordered, Perform/monitor CIWA, COWS, AIMS and Fall Risk screenings as ordered, Perform wound care treatments as ordered.  Evaluation of Outcomes: Progressing   LCSW Treatment Plan for Primary Diagnosis: Schizoaffective disorder (HCC) Long Term Goal(s): Safe transition to appropriate next level of care at discharge, Engage patient in therapeutic group addressing interpersonal concerns.  Short Term Goals: Engage patient in aftercare planning with referrals and resources, Increase social support, Increase emotional regulation, Facilitate acceptance of mental health diagnosis and concerns, Identify triggers associated with mental health/substance abuse issues, and Increase skills for wellness and recovery  Therapeutic Interventions: Assess for all discharge needs, 1 to 1 time with Social worker, Explore available resources and support systems, Assess for adequacy in community support network, Educate family and significant other(s) on suicide prevention, Complete Psychosocial Assessment, Interpersonal group therapy.  Evaluation of Outcomes: Progressing   Progress in Treatment: Attending groups: Yes. Participating in groups: No. Taking medication as prescribed: Yes. Toleration medication: Yes. Family/Significant other contact made: Yes, individual(s) contacted:  mother contacted  Patient understands diagnosis: No. Discussing patient identified problems/goals with staff: Yes. Medical problems stabilized or resolved: Yes. Denies suicidal/homicidal ideation: Yes. Issues/concerns per patient self-inventory: No.     New problem(s) identified: No,  Describe:  none reported    New Short Term/Long Term Goal(s):   medication stabilization, elimination of SI thoughts, development of comprehensive mental wellness plan.      Patient Goals:  Pt will continue to work on initial tx team goals.    Discharge Plan or Barriers: Patient was kicked out of group home and mom does not want  her back.  Pt has follow up appointment for therapy and med management at [facility]    Reason for Continuation of Hospitalization: Anxiety Depression Hallucinations Medication stabilization Other; describe IDD   Estimated Length of Stay: TBD- placement issue and need clarification about what kind of environment Drs recommend for patient.   Last 3 [place] Suicide Severity Risk Score: Flowsheet Row Ad[facility] (Current) [facility] from [facility]/19/2022 in [facility] Department at [facility] ED from 11/12/2022 in [facility] Department at [name]  C-SSRS RISK CATEGORY High Risk High Risk Moderate Risk       Last [facility] 2/9 Scores:     No data to display          Scribe for Treatment Team: Beather Arbour 12/31/2022 10:24 AM

## 2022-12-31 NOTE — Progress Notes (Signed)
Adult Psychoeducational Group Note  Date:  12/31/2022 Time:  9:33 PM  Group Topic/Focus:  Wrap-Up Group:   The focus of this group is to help patients review their daily goal of treatment and discuss progress on daily workbooks.  Participation Level:  Did Not Attend  Participation Quality:  Appropriate  Affect:  Appropriate  Cognitive:  Appropriate  Insight: Appropriate  Engagement in Group:  None  Modes of Intervention:  Discussion  Additional Comments:  Pt did not attend group.  Joselyn Arrow 12/31/2022, 9:33 PM

## 2022-12-31 NOTE — Group Note (Signed)
Date:  12/31/2022 Time:  1:53 PM  Group Topic/Focus:  Coping With Mental Health Crisis:   The purpose of this group is to help patients identify strategies for coping with mental health crisis.  Group discusses possible causes of crisis and ways to manage them effectively.    Participation Level:  Did Not Attend  Participation Quality:      Affect:      Cognitive:      Insight: None  Engagement in Group:    Modes of Intervention:      Additional Comments:     [name] 12/31/2022, 1:53 PM

## 2023-01-01 ENCOUNTER — Encounter (HOSPITAL_COMMUNITY): Payer: Self-pay | Admitting: Psychiatry

## 2023-01-01 DIAGNOSIS — F17200 Nicotine dependence, unspecified, uncomplicated: Secondary | ICD-10-CM | POA: Diagnosis present

## 2023-01-01 DIAGNOSIS — F250 Schizoaffective disorder, bipolar type: Secondary | ICD-10-CM | POA: Diagnosis not present

## 2023-01-01 MED ORDER — TRAZODONE HCL 50 MG PO TABS
50.0000 mg | ORAL_TABLET | Freq: Every evening | ORAL | Status: DC | PRN
  Administered 2023-01-04 – 2023-01-09 (×5): 50 mg via ORAL
  Filled 2023-01-01 (×5): qty 1

## 2023-01-01 NOTE — Group Note (Signed)
Recreation Therapy Group Note   Group Topic:Animal Assisted Therapy   Group Date: 01/01/2023 Start Time: 0945 End Time: 1031 Facilitators: Dawnisha Marquina-McCall, LRT,CTRS Location: [address] Dayroom   Animal-Assisted Activity (AAA) Program Checklist/Progress Notes Patient Eligibility Criteria Checklist & Daily Group note for Rec Tx Intervention  AAA/T Program Assumption of Risk Form signed by Patient/ or Parent Legal Guardian Yes  Patient understands his/her participation is voluntary Yes   Affect/Mood: N/A   Participation Level: Did not attend    Clinical Observations/Individualized Feedback:     Plan: Continue to engage patient in RT group sessions 2-3x/week.   Yolanda [name], LRT,CTRS 01/01/2023 12:59 PM

## 2023-01-01 NOTE — Group Note (Signed)
Date:  01/01/2023 Time:  12:23 PM  Group Topic/Focus:  Coping With Mental Health Crisis:   The purpose of this group is to help patients identify strategies for coping with mental health crisis.  Group discusses possible causes of crisis and ways to manage them effectively. Early Warning Signs:   The focus of this group is to help patients identify signs or symptoms they exhibit before slipping into an unhealthy state or crisis.    Participation Level:  Minimal  Participation Quality:  Appropriate  Affect:  Appropriate  Cognitive:  Alert  Insight: Appropriate  Engagement in Group:  Limited  Modes of Intervention:  Discussion, Exploration, Socialization, and Support  Additional Comments:    Memory Dance Yolanda [name] 01/01/2023, 12:23 PM

## 2023-01-01 NOTE — Progress Notes (Signed)
   12/31/22 2326  Psych Admission Type (Psych Patients Only)  Admission Status Voluntary  Psychosocial Assessment  Patient Complaints Sleep disturbance;Anxiety  Eye Contact Fair  Facial Expression Animated  Affect Appropriate to circumstance  Speech Logical/coherent;Slow;Soft  Interaction Childlike  Motor Activity Slow  Appearance/Hygiene Disheveled  Behavior Characteristics Cooperative;Guarded  Mood Depressed  Thought Process  Coherency WDL  Content WDL  Delusions WDL  Perception WDL  Hallucination None reported or observed  Judgment Limited  Confusion WDL  Danger to Self  Current suicidal ideation? Denies  Danger to Others  Danger to Others None reported or observed   During initial interaction pt was lying in bed awake, respirations even/unlabored,childlike, states she had a good day, with "low anxiety." Encouraged to come to AA meeting, pt refused, received hs meds as requested, currently denies SI/HI or hallucinations (a) 15 min checks (R) safety maintained.

## 2023-01-01 NOTE — Progress Notes (Signed)
Adult Psychoeducational Group Note  Date:  01/01/2023 Time:  10:51 PM  Group Topic/Focus:  Wrap-Up Group:   The focus of this group is to help patients review their daily goal of treatment and discuss progress on daily workbooks.  Participation Level:  Active  Participation Quality:  Appropriate  Affect:  Appropriate  Cognitive:  Appropriate  Insight: Appropriate  Engagement in Group:  Engaged  Modes of Intervention:  Discussion  Additional Comments: Pt came  late and Made no comment                                                  Joselyn Arrow 01/01/2023, 10:51 PM

## 2023-01-01 NOTE — BHH Counselor (Signed)
CSW made an report to [facility] Adult protective Services pertaining to guardianship and potential placement. Placed report with intake SW Edsel Petrin on 6/24, case was accepted on 6/25. Will continue to monitor.

## 2023-01-01 NOTE — Progress Notes (Addendum)
Patient ID: Yolanda [name], female   DOB: [birth year], 47 y.o.   MRN: [id number] [facility] MD Progress Note  01/01/2023 1:05 PM Yolanda [name]  MRN:  [id number]  Reason for admission: This is the first psychiatric admission in this [facility] in 12 years for this 80 AA female with an extensive hx of mental illnesses & probable polysubstance use disorders. She is admitted to the [facility] from the [facility] hospital with complain of worsening suicidal ideations with plan to stab herself. Per chart review, patient apparently reported at the ED that she has been depressed for a while & has not been taking her mental health medications. After medical evaluation.clearance, she was transferred to the [facility] for further psychiatric evaluation/treatments.   Patient was seen laying in bed, reading, no acute distress. She was pleasant and cooperative.   Her sleep and appetite are stable and appropriate. She denied medication side effects. Her mood is still depressed, unchanged. She became tearful again when talked about housing. MOCA completed, 16/20.   Today, she denied SI/HI/AVH, paranoia.   Review of Systems  Respiratory:  Negative for shortness of breath.   Cardiovascular:  Negative for chest pain.  Gastrointestinal:  Negative for nausea and vomiting.  Neurological:  Negative for dizziness and headaches.     Principal Problem: Schizoaffective disorder (HCC)   Diagnosis: Principal Problem:   Schizoaffective disorder (HCC) Active Problems:   MDD (major depressive disorder)  Total Time spent with patient: see attending attestation  Past Psychiatric History: See H&P.  Past Medical History:  Past Medical History:  Diagnosis Date   Bipolar affect, depressed (HCC)    Depression     Past Surgical History:  Procedure Laterality Date   NO PAST SURGERIES     SALPINGECTOMY     Family History: History reviewed. No pertinent family history.  Family Psychiatric  History: See H&P.  Social History:  Social  History   Substance and Sexual Activity  Alcohol Use Yes     Social History   Substance and Sexual Activity  Drug Use Yes   Types: Cocaine, Marijuana    Social History   Socioeconomic History   Marital status: Single    Spouse name: Not on file   Number of children: Not on file   Years of education: Not on file   Highest education level: Not on file  Occupational History   Not on file  Tobacco Use   Smoking status: Every Day   Smokeless tobacco: Not on file  Substance and Sexual Activity   Alcohol use: Yes   Drug use: Yes    Types: Cocaine, Marijuana   Sexual activity: Yes  Other Topics Concern   Not on file  Social History Narrative   Not on file   Social Determinants of Health   Financial Resource Strain: Not on file  Food Insecurity: Patient Declined (12/20/2022)   Hunger Vital Sign    Worried About Running Out of Food in the Last Year: Patient declined    Ran Out of Food in the Last Year: Patient declined  Transportation Needs: No Transportation Needs (12/20/2022)   PRAPARE - Administrator, Civil Service (Medical): No    Lack of Transportation (Non-Medical): No  Physical Activity: Not on file  Stress: Not on file  Social Connections: Not on file   Current Medications: Current Facility-Administered Medications  Medication Dose Route Frequency Provider Last Rate Last Admin   acetaminophen (TYLENOL) tablet 650 mg  650 mg Oral Q6H  PRN [name], NP   650 mg at 12/29/22 1600   alum & mag hydroxide-simeth (MAALOX/MYLANTA) 200-200-20 MG/5ML suspension 30 mL  30 mL Oral Q4H PRN [name], NP       busPIRone (BUSPAR) tablet 15 mg  15 mg Oral BID [name], NP   15 mg at 01/01/23 0753   diphenhydrAMINE (BENADRYL) capsule 50 mg  50 mg Oral TID PRN [name], NP       Or   diphenhydrAMINE (BENADRYL) injection 50 mg  50 mg Intramuscular TID PRN [name], NP       haloperidol (HALDOL) tablet 5 mg  5 mg Oral TID PRN [name], NP        Or   haloperidol lactate (HALDOL) injection 5 mg  5 mg Intramuscular TID PRN [name], NP       hydrOXYzine (ATARAX) tablet 25 mg  25 mg Oral TID PRN [name], DO       LORazepam (ATIVAN) tablet 2 mg  2 mg Oral TID PRN [name], NP       Or   LORazepam (ATIVAN) injection 2 mg  2 mg Intramuscular TID PRN [name], NP       magnesium hydroxide (MILK OF MAGNESIA) suspension 30 mL  30 mL Oral Daily PRN [name], NP       nicotine (NICODERM CQ - dosed in mg/24 hours) patch 21 mg  21 mg Transdermal Daily [name], DO   21 mg at 01/01/23 0753   paliperidone (INVEGA) 24 hr tablet 3 mg  3 mg Oral Daily [name], NP   3 mg at 01/01/23 0753   pantoprazole (PROTONIX) EC tablet 40 mg  40 mg Oral Daily [name], NP   40 mg at 01/01/23 0753   sertraline (ZOLOFT) tablet 100 mg  100 mg Oral Daily Park Pope, MD   100 mg at 01/01/23 0753   traZODone (DESYREL) tablet 100 mg  100 mg Oral QHS PRN [name], NP   100 mg at 12/31/22 2145   Lab Results:  No results found for this or any previous visit (from the past 48 hour(s)).  Blood Alcohol level:  Lab Results  Component Value Date   ETH <10 12/19/2022   ETH <10 11/21/2019   Metabolic Disorder Labs: Lab Results  Component Value Date   HGBA1C 4.4 (L) 12/21/2022   MPG 79.58 12/21/2022   No results found for: "PROLACTIN" Lab Results  Component Value Date   CHOL 218 (H) 12/21/2022   TRIG 81 12/21/2022   HDL 53 12/21/2022   CHOLHDL 4.1 12/21/2022   VLDL 16 12/21/2022   LDLCALC 149 (H) 12/21/2022   LDLCALC 110 (H) 03/13/2011   Physical Findings: AIMS:  , ,  ,  ,    CIWA:    COWS:     Musculoskeletal: Strength & Muscle Tone: within normal limits Gait & Station: normal Patient leans: N/A  Psychiatric Specialty Exam:  Presentation  General Appearance:  Appropriate for Environment; Casual  Eye Contact: Fair  Speech: Clear and Coherent; Normal Rate  Speech  Volume: Decreased  Handedness: Right  Mood and Affect  Mood: Depressed  Affect: Congruent; Appropriate; Depressed; Restricted; Tearful  Thought Process  Thought Processes: Coherent; Goal Directed; Linear  Descriptions of Associations:Intact  Orientation:Full (Time, Place and Person)  Thought Content:Rumination; Perseveration  History of Schizophrenia/Schizoaffective disorder:Yes  Duration of Psychotic Symptoms:Greater than six months  Hallucinations:Hallucinations: None   Ideas  of Reference:None  Suicidal Thoughts:Suicidal Thoughts: No   Homicidal Thoughts:Homicidal Thoughts: No   Sensorium  Memory: Immediate Good  Judgment: Impaired  Insight: Shallow  Executive Functions  Concentration: Good  Attention Span: Good  Recall: Good  Fund of Knowledge: Good  Language: Good  Psychomotor Activity  Psychomotor Activity: Psychomotor Activity: Decreased; Psychomotor Retardation   Assets  Assets: Communication Skills; Desire for Improvement  Sleep  Sleep: Sleep: Good   Physical Exam: Physical Exam Vitals and nursing note reviewed.  Constitutional:      General: She is not in acute distress.    Appearance: She is not ill-appearing or diaphoretic.  HENT:     Head: Normocephalic.     Nose: No congestion.  Pulmonary:     Effort: Pulmonary effort is normal. No respiratory distress.  Neurological:     Mental Status: She is alert and oriented to person, place, and time.    Blood pressure (!) 72/32, pulse (!) 134, temperature 97.7 F (36.5 C), temperature source Oral, resp. rate 16, height 4\' 11"  (1.499 m), weight 63 kg, SpO2 100 %. Body mass index is 28.05 kg/m.  Treatment Plan Summary: Daily contact with patient to assess and evaluate symptoms and progress in treatment and Medication management.   She is still labile with tearfulness, however just increased SSRI dose per below, will allow it time to work.  She had low BP on VS,  however was asx, likely due to machine error since her BP prior was normal. Will continue to monitor VS. Still working on dispo, as she is not able to safely live alone and has no where else to go at this current time. CSW working on APS report. Her MOCA completed, was 16/30, however possible IDD hx, but unsure due to unclear hx. Given suspicious for IDD, she would not benefit from aricept or amantadine currently, since cognitive impairment is likely chronic.   Principal/active diagnoses:  Schizoaffective disorder, bipolar type.  Moderate cognitive impairment (MOCA 16/30)  Plan: Re-initiated:  -Continue Buspar 15 mg po bid for anxiety.  -Continue paliperidone 3 mg po daily for mood control -Continued Sertraline 100 mg po Q daily for depression -Continue Trazodone 50 mg po Q bedtime prn for insomnia -Continue Nicoderm topically Q 24 hrs for nicotine withdrawal management   Safety and Monitoring: Voluntary admission to inpatient psychiatric unit for safety, stabilization and treatment Daily contact with patient to assess and evaluate symptoms and progress in treatment Patient's case to be discussed in multi-disciplinary team meeting Observation Level : q15 minute checks Vital signs: q12 hours Precautions: Safety   Discharge Planning: Social work and case management to assist with discharge planning and identification of hospital follow-up needs prior to discharge Estimated LOS: 5-7 days Discharge Concerns: Need to establish a safety plan; Medication compliance and effectiveness Discharge Goals: Return home with outpatient referrals for mental health follow-up including medication management/psychotherapy Appreciate CSW for APS report   [name], DO Psych Resident, PGY-2 01/01/2023, 1:05 PM  Patient ID: Yolanda [name], female   DOB: June 20, 1976, 47 y.o.   MRN: [id number]

## 2023-01-01 NOTE — Progress Notes (Signed)
   01/01/23 1058  Psych Admission Type (Psych Patients Only)  Admission Status Voluntary  Psychosocial Assessment  Patient Complaints Sleep disturbance  Eye Contact Fair  Facial Expression Animated  Affect Appropriate to circumstance  Speech Logical/coherent;Soft;Slow  Interaction Childlike  Motor Activity Slow  Appearance/Hygiene Disheveled  Behavior Characteristics Cooperative;Guarded  Mood Depressed  Thought Process  Coherency WDL  Content WDL  Delusions WDL  Perception WDL  Hallucination None reported or observed  Judgment Limited  Confusion WDL  Danger to Self  Current suicidal ideation? Denies  Danger to Others  Danger to Others None reported or observed

## 2023-01-02 DIAGNOSIS — F250 Schizoaffective disorder, bipolar type: Secondary | ICD-10-CM | POA: Diagnosis not present

## 2023-01-02 NOTE — Progress Notes (Signed)
[name], DO made aware of current vitals.

## 2023-01-02 NOTE — Progress Notes (Signed)
   01/02/23 0559  15 Minute Checks  Location Bedroom  Visual Appearance Calm  Behavior Sleeping  Sleep (Behavioral Health Patients Only)  Calculate sleep? (Click Yes once per 24 hr at 0600 safety check) Yes  Documented sleep last 24 hours 6.25

## 2023-01-02 NOTE — Progress Notes (Signed)
   01/01/23 2030  Psych Admission Type (Psych Patients Only)  Admission Status Voluntary  Psychosocial Assessment  Patient Complaints Anxiety;Depression  Eye Contact Fair  Facial Expression Sad  Affect Appropriate to circumstance  Speech Logical/coherent;Soft;Slow  Interaction Childlike  Motor Activity Slow  Appearance/Hygiene Disheveled;In hospital gown  Behavior Characteristics Cooperative;Fidgety  Mood Depressed  Thought Process  Coherency WDL  Content WDL  Delusions None reported or observed  Perception WDL  Hallucination None reported or observed  Judgment Limited  Confusion None  Danger to Self  Current suicidal ideation? Denies  Agreement Not to Harm Self Yes  Description of Agreement verbal  Danger to Others  Danger to Others None reported or observed   Pt is pleasant and cooperative. Pt was offered support and encouragement. Pt was given PRN Hydroxyzine per MAR. Q 15 minute checks were done for safety. Pt interacts well with peers and staff. Pt has no complaints.Pt receptive to treatment and safety maintained on unit.

## 2023-01-02 NOTE — Group Note (Signed)
Recreation Therapy Group Note   Group Topic:Stress Management  Group Date: 01/02/2023 Start Time: 0935 End Time: 1000 Facilitators: Grenda Lora-McCall, LRT,CTRS Location: [address] Dayroom   Goal Area(s) Addresses:  Patient will actively participate in stress management techniques presented during session.  Patient will successfully identify benefit of practicing stress management post d/c.   Group Description: Guided Imagery. LRT provided education, instruction, and demonstration on practice of visualization via guided imagery. Patient was asked to participate in the technique introduced during session. LRT debriefed including topics of mindfulness, stress management and specific scenarios each patient could use these techniques. Patients were given suggestions of ways to access scripts post d/c and encouraged to explore Youtube and other apps available on smartphones, tablets, and computers.   Affect/Mood: N/A   Participation Level: Did not attend    Clinical Observations/Individualized Feedback:      Plan: Continue to engage patient in RT group sessions 2-3x/week.   Madeleine Fenn-McCall, LRT,CTRS 01/02/2023 11:22 AM

## 2023-01-02 NOTE — BHH Group Notes (Signed)
Psychoeducational Group Note  Date:  01/02/2023 Time:  2000  Group Topic/Focus:  Narcotics anonymous meeting  Participation Level: Did Not Attend  Participation Quality:  Not Applicable  Affect:  Not Applicable  Cognitive:  Not Applicable  Insight:  Not Applicable  Engagement in Group: Not Applicable  Additional Comments:  Did not attend.   [name] S 01/02/2023, 10:09 PM

## 2023-01-02 NOTE — Progress Notes (Addendum)
Patient ID: Yolanda [name], female   DOB: October 11, 1975, 47 y.o.   MRN: [id number] [facility] MD Progress Note  01/02/2023 10:03 AM Yolanda [name]  MRN:  [id number]  Reason for admission: This is the first psychiatric admission in this [facility] in 12 years for this 57 AA female with an extensive hx of mental illnesses & probable polysubstance use disorders. She is admitted to the [facility] from the [facility] hospital with complain of worsening suicidal ideations with plan to stab herself. Per chart review, patient apparently reported at the ED that she has been depressed for a while & has not been taking her mental health medications. After medical evaluation.clearance, she was transferred to the [facility] for further psychiatric evaluation/treatments.  APS report on 6/24  Patient was seen laying in bed, reading, no acute distress. She was pleasant and cooperative. Was wearing hospital gown and malodorous.  Reported her mood as "ok", anxiety and depression improving, but still present. Her sleep and appetite are intact.   Today, she denied SI/HI/AVH, paranoia.   Asked about BP, she denied any sxs per below.  Review of Systems  Eyes:  Negative for blurred vision and double vision.  Respiratory:  Negative for shortness of breath.   Cardiovascular:  Negative for chest pain.  Gastrointestinal:  Negative for abdominal pain, nausea and vomiting.  Neurological:  Negative for dizziness, seizures, weakness and headaches.    Principal Problem: Schizoaffective disorder, depressive type (HCC)   Diagnosis: Principal Problem:   Schizoaffective disorder, depressive type (HCC) Active Problems:   GERD (gastroesophageal reflux disease)   Intellectual disability   Tobacco use disorder  Total Time spent with patient: see attending attestation  Past Psychiatric History: See H&P.  Past Medical History:  Past Medical History:  Diagnosis Date   Bipolar affect, depressed (HCC)    Constipation 08/17/2022   Depression     Falls 07/21/2022   Fracture of femoral neck, right, closed (HCC) 01/17/2022   Herpes simplex 08/22/2017   Open fracture dislocation of right elbow joint 01/17/2022    Past Surgical History:  Procedure Laterality Date   NO PAST SURGERIES     SALPINGECTOMY     Family History: History reviewed. No pertinent family history.  Family Psychiatric  History: See H&P.  Social History:  Social History   Substance and Sexual Activity  Alcohol Use Yes     Social History   Substance and Sexual Activity  Drug Use Yes   Types: Cocaine, Marijuana    Social History   Socioeconomic History   Marital status: Single    Spouse name: Not on file   Number of children: Not on file   Years of education: Not on file   Highest education level: Not on file  Occupational History   Not on file  Tobacco Use   Smoking status: Every Day   Smokeless tobacco: Not on file  Substance and Sexual Activity   Alcohol use: Yes   Drug use: Yes    Types: Cocaine, Marijuana   Sexual activity: Yes  Other Topics Concern   Not on file  Social History Narrative   Not on file   Social Determinants of Health   Financial Resource Strain: Not on file  Food Insecurity: Patient Declined (12/20/2022)   Hunger Vital Sign    Worried About Running Out of Food in the Last Year: Patient declined    Ran Out of Food in the Last Year: Patient declined  Transportation Needs: No Transportation Needs (12/20/2022)  PRAPARE - Administrator, Civil Service (Medical): No    Lack of Transportation (Non-Medical): No  Physical Activity: Not on file  Stress: Not on file  Social Connections: Not on file   Current Medications: Current Facility-Administered Medications  Medication Dose Route Frequency Provider Last Rate Last Admin   acetaminophen (TYLENOL) tablet 650 mg  650 mg Oral Q6H PRN [name], NP   650 mg at 12/29/22 1600   alum & mag hydroxide-simeth (MAALOX/MYLANTA) 200-200-20 MG/5ML suspension 30 mL   30 mL Oral Q4H PRN [name], NP       busPIRone (BUSPAR) tablet 15 mg  15 mg Oral BID [name], NP   15 mg at 01/02/23 0830   diphenhydrAMINE (BENADRYL) capsule 50 mg  50 mg Oral TID PRN [name], NP       Or   diphenhydrAMINE (BENADRYL) injection 50 mg  50 mg Intramuscular TID PRN [name], NP       haloperidol (HALDOL) tablet 5 mg  5 mg Oral TID PRN [name], NP       Or   haloperidol lactate (HALDOL) injection 5 mg  5 mg Intramuscular TID PRN [name], NP       hydrOXYzine (ATARAX) tablet 25 mg  25 mg Oral TID PRN [name], DO   25 mg at 01/01/23 2222   LORazepam (ATIVAN) tablet 2 mg  2 mg Oral TID PRN [name], NP       Or   LORazepam (ATIVAN) injection 2 mg  2 mg Intramuscular TID PRN [name], NP       magnesium hydroxide (MILK OF MAGNESIA) suspension 30 mL  30 mL Oral Daily PRN [name], NP       nicotine (NICODERM CQ - dosed in mg/24 hours) patch 21 mg  21 mg Transdermal Daily [name], DO   21 mg at 01/01/23 0753   paliperidone (INVEGA) 24 hr tablet 3 mg  3 mg Oral Daily Nwoko, Nicole Kindred I, NP   3 mg at 01/02/23 0830   pantoprazole (PROTONIX) EC tablet 40 mg  40 mg Oral Daily [name], NP   40 mg at 01/02/23 0830   sertraline (ZOLOFT) tablet 100 mg  100 mg Oral Daily Park Pope, MD   100 mg at 01/02/23 0830   traZODone (DESYREL) tablet 50 mg  50 mg Oral QHS PRN [name], DO       Lab Results:  No results found for this or any previous visit (from the past 48 hour(s)).  Blood Alcohol level:  Lab Results  Component Value Date   ETH <10 12/19/2022   ETH <10 11/21/2019   Metabolic Disorder Labs: Lab Results  Component Value Date   HGBA1C 4.4 (L) 12/21/2022   MPG 79.58 12/21/2022   No results found for: "PROLACTIN" Lab Results  Component Value Date   CHOL 218 (H) 12/21/2022   TRIG 81 12/21/2022   HDL 53 12/21/2022   CHOLHDL 4.1 12/21/2022   VLDL 16 12/21/2022   LDLCALC 149 (H) 12/21/2022   LDLCALC 110 (H)  03/13/2011   Physical Findings: AIMS:  , ,  ,  ,     Musculoskeletal: Strength & Muscle Tone: within normal limits Gait & Station: normal Patient leans: N/A  Psychiatric Specialty Exam:  Presentation  General Appearance:  Disheveled (malodorous)  Eye Contact: Good  Speech: Clear and Coherent; Normal Rate  Speech Volume: Normal  Handedness: Right  Mood and  Affect  Mood: Depressed; Anxious  Affect: Appropriate; Congruent; Full Range  Thought Process  Thought Processes: Coherent; Goal Directed; Linear  Descriptions of Associations:Intact  Orientation:Full (Time, Place and Person)  Thought Content:Rumination  History of Schizophrenia/Schizoaffective disorder:Yes  Duration of Psychotic Symptoms:Greater than six months  Hallucinations:Hallucinations: None  Ideas of Reference:None  Suicidal Thoughts:Suicidal Thoughts: No  Homicidal Thoughts:Homicidal Thoughts: No  Sensorium  Memory: Immediate Good  Judgment: Fair  Insight: Fair  Executive Functions  Concentration: Good  Attention Span: Good  Recall: Good  Fund of Knowledge: Good  Language: Good  Psychomotor Activity  Psychomotor Activity: Psychomotor Activity: Decreased  Assets  Assets: Desire for Improvement; Leisure Time  Sleep  Sleep: Sleep: Good  Physical Exam: Physical Exam Vitals and nursing note reviewed.  Constitutional:      General: She is not in acute distress.    Appearance: She is not ill-appearing or diaphoretic.  HENT:     Head: Normocephalic.     Nose: No congestion.  Pulmonary:     Effort: Pulmonary effort is normal. No respiratory distress.  Neurological:     Mental Status: She is alert and oriented to person, place, and time.    Blood pressure (!) 148/101, pulse 75, temperature 98.4 F (36.9 C), temperature source Oral, resp. rate 12, height 4\' 11"  (1.499 m), weight 63 kg, SpO2 100 %. Body mass index is 28.05 kg/m.  Treatment Plan  Summary: Daily contact with patient to assess and evaluate symptoms and progress in treatment and Medication management.   Still waiting on safe dispo as patient would not be able to live independently given cognitive impairment. Recommend assisted living.  No medication side effects.  She is disheveled and malodorous, possibly urinated on herself. RN was washing her clothes. Also will be monitoring BP with manual to see if antihypertensive is needed  Principal/active diagnoses:  Schizoaffective disorder, bipolar type.  Moderate cognitive impairment (MOCA 16/30)  Plan: Re-initiated:  -Continue Buspar 15 mg po bid for anxiety.  -Continue paliperidone 3 mg po daily for mood control -Continued Sertraline 100 mg po Q daily for depression -Continue Trazodone 50 mg po Q bedtime prn for insomnia -Continue Nicoderm topically Q 24 hrs for nicotine withdrawal management   Safety and Monitoring: Voluntary admission to inpatient psychiatric unit for safety, stabilization and treatment Daily contact with patient to assess and evaluate symptoms and progress in treatment Patient's case to be discussed in multi-disciplinary team meeting Observation Level : q15 minute checks Vital signs: q12 hours Precautions: Safety   Discharge Planning: Social work and case management to assist with discharge planning and identification of hospital follow-up needs prior to discharge Estimated LOS: 5-7 days Discharge Concerns: Need to establish a safety plan; Medication compliance and effectiveness Discharge Goals: Return home with outpatient referrals for mental health follow-up including medication management/psychotherapy Appreciate CSW for APS report 12/31/2022 Barrier: safe dispo  [name], DO Psych Resident, PGY-2 01/02/2023, 10:03 AM  Patient ID: Yolanda [name], female   DOB: [birth year], 47 y.o.   MRN: [id number]

## 2023-01-02 NOTE — Progress Notes (Signed)
   01/02/23 0830  Psych Admission Type (Psych Patients Only)  Admission Status Voluntary  Psychosocial Assessment  Patient Complaints None  Eye Contact Fair  Facial Expression Flat  Affect Flat  Speech Logical/coherent  Interaction Assertive  Motor Activity Slow  Appearance/Hygiene Body odor;Disheveled;Poor hygiene;In hospital gown  Behavior Characteristics Cooperative  Mood Pleasant  Thought Process  Coherency WDL  Content WDL  Delusions None reported or observed  Perception WDL  Hallucination None reported or observed  Judgment Poor  Confusion None  Danger to Self  Current suicidal ideation? Denies  Agreement Not to Harm Self Yes  Description of Agreement verbal  Danger to Others  Danger to Others None reported or observed

## 2023-01-03 DIAGNOSIS — Z5900 Homelessness unspecified: Secondary | ICD-10-CM | POA: Diagnosis not present

## 2023-01-03 DIAGNOSIS — F250 Schizoaffective disorder, bipolar type: Secondary | ICD-10-CM | POA: Diagnosis not present

## 2023-01-03 DIAGNOSIS — R45851 Suicidal ideations: Secondary | ICD-10-CM | POA: Diagnosis not present

## 2023-01-03 DIAGNOSIS — E039 Hypothyroidism, unspecified: Secondary | ICD-10-CM | POA: Diagnosis not present

## 2023-01-03 LAB — CBC WITH DIFFERENTIAL/PLATELET
Abs Immature Granulocytes: 0.01 10*3/uL (ref 0.00–0.07)
Basophils Absolute: 0 10*3/uL (ref 0.0–0.1)
Basophils Relative: 1 %
Eosinophils Absolute: 0.2 10*3/uL (ref 0.0–0.5)
Eosinophils Relative: 4 %
HCT: 38.8 % (ref 36.0–46.0)
Hemoglobin: 11.9 g/dL — ABNORMAL LOW (ref 12.0–15.0)
Immature Granulocytes: 0 %
Lymphocytes Relative: 48 %
Lymphs Abs: 2.6 10*3/uL (ref 0.7–4.0)
MCH: 28.6 pg (ref 26.0–34.0)
MCHC: 30.7 g/dL (ref 30.0–36.0)
MCV: 93.3 fL (ref 80.0–100.0)
Monocytes Absolute: 0.3 10*3/uL (ref 0.1–1.0)
Monocytes Relative: 6 %
Neutro Abs: 2.3 10*3/uL (ref 1.7–7.7)
Neutrophils Relative %: 41 %
Platelets: 158 10*3/uL (ref 150–400)
RBC: 4.16 MIL/uL (ref 3.87–5.11)
RDW: 12.6 % (ref 11.5–15.5)
WBC: 5.5 10*3/uL (ref 4.0–10.5)
nRBC: 0 % (ref 0.0–0.2)

## 2023-01-03 LAB — COMPREHENSIVE METABOLIC PANEL
ALT: 15 U/L (ref 0–44)
AST: 15 U/L (ref 15–41)
Albumin: 3.4 g/dL — ABNORMAL LOW (ref 3.5–5.0)
Alkaline Phosphatase: 94 U/L (ref 38–126)
Anion gap: 7 (ref 5–15)
BUN: 13 mg/dL (ref 6–20)
CO2: 29 mmol/L (ref 22–32)
Calcium: 8.8 mg/dL — ABNORMAL LOW (ref 8.9–10.3)
Chloride: 104 mmol/L (ref 98–111)
Creatinine, Ser: 0.88 mg/dL (ref 0.44–1.00)
GFR, Estimated: >60 mL/min (ref >60)
Glucose, Bld: 115 mg/dL — ABNORMAL HIGH (ref 70–99)
Potassium: 3.7 mmol/L (ref 3.5–5.1)
Sodium: 140 mmol/L (ref 135–145)
Total Bilirubin: 0.4 mg/dL (ref 0.3–1.2)
Total Protein: 7 g/dL (ref 6.5–8.1)

## 2023-01-03 LAB — VITAMIN B12: Vitamin B-12: 166 pg/mL — ABNORMAL LOW (ref 180–914)

## 2023-01-03 MED ORDER — POLYETHYLENE GLYCOL 3350 17 G PO PACK
17.0000 g | PACK | Freq: Two times a day (BID) | ORAL | Status: DC
  Administered 2023-01-03 – 2023-01-26 (×28): 17 g via ORAL
  Filled 2023-01-03 (×58): qty 1

## 2023-01-03 NOTE — Progress Notes (Signed)
Patient ID: Yolanda [name], female   DOB: 07/16/1975, 47 y.o.   MRN: [id number] [facility] MD Progress Note  01/03/2023 9:07 AM Yolanda [name]  MRN:  [id number]  Reason for admission: This is the first psychiatric admission in this [facility] in 12 years for this 50 AA female with an extensive hx of mental illnesses & probable polysubstance use disorders. She is admitted to the [facility] from the [facility] hospital with complain of worsening suicidal ideations with plan to stab herself. Per chart review, patient apparently reported at the ED that she has been depressed for a while & has not been taking her mental health medications. After medical evaluation.clearance, she was transferred to the [facility] for further psychiatric evaluation/treatments.  APS report on 6/24 Has payee, no legal guardian  Overnight events: another episode of urinary incontinence, malodorous. No other acute behavioral events  Reported feeling "fine", her sleep was good.  Appetite is good.  She attended some of the group yesterday.  Denied medication side effects.  Reported her last bowel movement was sometime last week.  She was amenable to stool softeners per below.  She had no other questions or concerns.  Today, she denied SI/HI/AVH, paranoia.   Review of Systems  Eyes:  Negative for blurred vision and double vision.  Respiratory:  Negative for shortness of breath.   Cardiovascular:  Negative for chest pain.  Gastrointestinal:  Positive for constipation. Negative for abdominal pain, diarrhea, nausea and vomiting.  Neurological:  Negative for dizziness, seizures, weakness and headaches.    Principal Problem: Schizoaffective disorder, depressive type (HCC)   Diagnosis: Principal Problem:   Schizoaffective disorder, depressive type (HCC) Active Problems:   GERD (gastroesophageal reflux disease)   Intellectual disability   Tobacco use disorder  Total Time spent with patient: see attending attestation  Past Psychiatric History:  See H&P.  Past Medical History:  Past Medical History:  Diagnosis Date   Bipolar affect, depressed (HCC)    Constipation 08/17/2022   Depression    Falls 07/21/2022   Fracture of femoral neck, right, closed (HCC) 01/17/2022   Herpes simplex 08/22/2017   Open fracture dislocation of right elbow joint 01/17/2022    Past Surgical History:  Procedure Laterality Date   NO PAST SURGERIES     SALPINGECTOMY     Family History: History reviewed. No pertinent family history.  Family Psychiatric  History: See H&P.  Social History:  Social History   Substance and Sexual Activity  Alcohol Use Yes     Social History   Substance and Sexual Activity  Drug Use Yes   Types: Cocaine, Marijuana    Social History   Socioeconomic History   Marital status: Single    Spouse name: Not on file   Number of children: Not on file   Years of education: Not on file   Highest education level: Not on file  Occupational History   Not on file  Tobacco Use   Smoking status: Every Day   Smokeless tobacco: Not on file  Substance and Sexual Activity   Alcohol use: Yes   Drug use: Yes    Types: Cocaine, Marijuana   Sexual activity: Yes  Other Topics Concern   Not on file  Social History Narrative   Not on file   Social Determinants of Health   Financial Resource Strain: Not on file  Food Insecurity: Patient Declined (12/20/2022)   Hunger Vital Sign    Worried About Running Out of Food in the Last Year:  Patient declined    Barista in the Last Year: Patient declined  Transportation Needs: No Transportation Needs (12/20/2022)   PRAPARE - Administrator, Civil Service (Medical): No    Lack of Transportation (Non-Medical): No  Physical Activity: Not on file  Stress: Not on file  Social Connections: Not on file   Current Medications: Current Facility-Administered Medications  Medication Dose Route Frequency Provider Last Rate Last Admin   acetaminophen (TYLENOL) tablet  650 mg  650 mg Oral Q6H PRN [name], NP   650 mg at 12/29/22 1600   alum & mag hydroxide-simeth (MAALOX/MYLANTA) 200-200-20 MG/5ML suspension 30 mL  30 mL Oral Q4H PRN [name], NP       busPIRone (BUSPAR) tablet 15 mg  15 mg Oral BID [name], NP   15 mg at 01/03/23 0804   diphenhydrAMINE (BENADRYL) capsule 50 mg  50 mg Oral TID PRN [name], NP       Or   diphenhydrAMINE (BENADRYL) injection 50 mg  50 mg Intramuscular TID PRN [name], NP       haloperidol (HALDOL) tablet 5 mg  5 mg Oral TID PRN [name], NP       Or   haloperidol lactate (HALDOL) injection 5 mg  5 mg Intramuscular TID PRN [name], NP       hydrOXYzine (ATARAX) tablet 25 mg  25 mg Oral TID PRN [name], DO   25 mg at 01/01/23 2222   LORazepam (ATIVAN) tablet 2 mg  2 mg Oral TID PRN [name], NP       Or   LORazepam (ATIVAN) injection 2 mg  2 mg Intramuscular TID PRN [name], NP       magnesium hydroxide (MILK OF MAGNESIA) suspension 30 mL  30 mL Oral Daily PRN [name], NP       nicotine (NICODERM CQ - dosed in mg/24 hours) patch 21 mg  21 mg Transdermal Daily [name], DO   21 mg at 01/01/23 0753   paliperidone (INVEGA) 24 hr tablet 3 mg  3 mg Oral Daily [name], NP   3 mg at 01/03/23 0804   pantoprazole (PROTONIX) EC tablet 40 mg  40 mg Oral Daily [name], NP   40 mg at 01/03/23 0804   sertraline (ZOLOFT) tablet 100 mg  100 mg Oral Daily Park Pope, MD   100 mg at 01/03/23 0804   traZODone (DESYREL) tablet 50 mg  50 mg Oral QHS PRN [name], DO       Lab Results:  No results found for this or any previous visit (from the past 48 hour(s)).  Blood Alcohol level:  Lab Results  Component Value Date   ETH <10 12/19/2022   ETH <10 11/21/2019   Metabolic Disorder Labs: Lab Results  Component Value Date   HGBA1C 4.4 (L) 12/21/2022   MPG 79.58 12/21/2022   No results found for: "PROLACTIN" Lab Results  Component Value Date   CHOL 218  (H) 12/21/2022   TRIG 81 12/21/2022   HDL 53 12/21/2022   CHOLHDL 4.1 12/21/2022   VLDL 16 12/21/2022   LDLCALC 149 (H) 12/21/2022   LDLCALC 110 (H) 03/13/2011   Physical Findings: AIMS:  , ,  ,  ,     Musculoskeletal: Strength & Muscle Tone: within normal limits Gait & Station: normal Patient leans: N/A  Psychiatric Specialty Exam:  Presentation  General Appearance:  Disheveled (malodorous)  Eye Contact: Good  Speech: Clear and Coherent; Normal Rate  Speech Volume: Normal  Handedness: Right  Mood and Affect  Mood: Depressed; Anxious  Affect: Appropriate; Congruent; Full Range  Thought Process  Thought Processes: Coherent; Goal Directed; Linear  Descriptions of Associations:Intact  Orientation:Full (Time, Place and Person)  Thought Content:Rumination  History of Schizophrenia/Schizoaffective disorder:Yes  Duration of Psychotic Symptoms:Greater than six months  Hallucinations:Hallucinations: None  Ideas of Reference:None  Suicidal Thoughts:Suicidal Thoughts: No  Homicidal Thoughts:Homicidal Thoughts: No  Sensorium  Memory: Immediate Good  Judgment: Fair  Insight: Fair  Executive Functions  Concentration: Good  Attention Span: Good  Recall: Good  Fund of Knowledge: Good  Language: Good  Psychomotor Activity  Psychomotor Activity: Psychomotor Activity: Decreased  Assets  Assets: Desire for Improvement; Leisure Time  Sleep  Sleep: Sleep: Good  Physical Exam: Physical Exam Vitals and nursing note reviewed.  Constitutional:      General: She is not in acute distress.    Appearance: She is not ill-appearing or diaphoretic.  HENT:     Head: Normocephalic.     Nose: No congestion.  Pulmonary:     Effort: Pulmonary effort is normal. No respiratory distress.  Neurological:     Mental Status: She is alert and oriented to person, place, and time.   Blood pressure 116/82, pulse 84, temperature 97.8 F (36.6 C),  temperature source Oral, resp. rate 12, height 4\' 11"  (1.499 m), weight 63 kg, SpO2 100 %. Body mass index is 28.05 kg/m.  Treatment Plan Summary: Daily contact with patient to assess and evaluate symptoms and progress in treatment and Medication management.   Still waiting on safe dispo as patient would not be able to live independently given cognitive impairment. Recommend assisted living.  No medication side effects.  BP normalized, likely 2/2 machine error over the past day.  Because of urinary incontinence, recommend adult diapers Also repeat labs due to extended stay  Principal/active diagnoses:  Principal Problem:   Schizoaffective disorder, depressive type (HCC) Active Problems:   GERD (gastroesophageal reflux disease)   Intellectual disability   Tobacco use disorder (MOCA 16/30)  Plan: Re-initiated:  -Continue Buspar 15 mg po bid for anxiety.  -Continue paliperidone 3 mg po daily for mood control -Continued Sertraline 100 mg po Q daily for depression -Continue Trazodone 50 mg po Q bedtime prn for insomnia -Continue Nicoderm topically Q 24 hrs for nicotine withdrawal management -F/u CBC, CMP, B12 -Messaged RN for adult diapers   Safety and Monitoring: Voluntary admission to inpatient psychiatric unit for safety, stabilization and treatment Daily contact with patient to assess and evaluate symptoms and progress in treatment Patient's case to be discussed in multi-disciplinary team meeting Observation Level : q15 minute checks Vital signs: q12 hours Precautions: Safety   Discharge Planning: Social work and case management to assist with discharge planning and identification of hospital follow-up needs prior to discharge Estimated LOS: 5-7 days Discharge Concerns: Need to establish a safety plan; Medication compliance and effectiveness Discharge Goals: Return home with outpatient referrals for mental health follow-up including medication  management/psychotherapy Appreciate CSW for APS report 12/31/2022 No legal guardian, has payee Barrier: safe dispo  [name], DO Psych Resident, PGY-2 01/03/2023, 9:07 AM

## 2023-01-03 NOTE — Group Note (Signed)
Date:  01/03/2023 Time:  12:16 PM  Group Topic/Focus:  Recovery Goals:   The focus of this group is to identify appropriate goals for recovery and establish a plan to achieve them. Stages of Change:   The focus of this group is to explain the stages of change and help patients identify changes they want to make upon discharge.    Participation Level:  Did Not Attend  Participation Quality:   Affect:    Cognitive:    Insight:   Engagement in Group:    Modes of Intervention:    Additional Comments:    Memory Dance Deyjah Kindel 01/03/2023, 12:16 PM

## 2023-01-03 NOTE — Progress Notes (Signed)
Patient ID: Yolanda [name], female   DOB: Jun 13, 1976, 46 y.o.   MRN: [id number] Pt presents with depressed mood, affect blunted. Yolanda [name] is disheveled and noted to have body odor. She initially denied any acute concerns but then later reported '' why is everyone doing better than me '' she was very tearful, anxious and emotional support and encouragement provided. She denies any SI HI or AV Hallucinations. She does appear limited, she stated to Clinical research associate '' oh this form I saw it was in spanish I didn't know what to do '' this form has been given to her daily (it is the daily self inventory from) and reoriented that english is on the reverse. She rates her depression, hopelessness and anxiety are all at 10/10 on scale 10 being worst.  Pt goal is to '' take medicine '' listen to nurses and come to med window'' Pt is safe, able to make needs known. Eating and drinking well. Will con't to monitor.

## 2023-01-03 NOTE — Progress Notes (Signed)
   01/02/23 2200  Psych Admission Type (Psych Patients Only)  Admission Status Voluntary  Psychosocial Assessment  Patient Complaints Anxiety;Depression  Eye Contact Fair  Facial Expression Flat  Affect Appropriate to circumstance  Speech Logical/coherent;Soft;Slow  Interaction Childlike;Minimal  Motor Activity Slow  Appearance/Hygiene Disheveled;In hospital gown;Poor hygiene  Behavior Characteristics Cooperative;Fidgety  Mood Pleasant;Sad  Thought Process  Coherency WDL  Content WDL  Delusions None reported or observed  Perception WDL  Hallucination None reported or observed  Judgment Limited  Confusion None  Danger to Self  Current suicidal ideation? Denies  Agreement Not to Harm Self Yes  Description of Agreement verbal  Danger to Others  Danger to Others None reported or observed   Pt was offered support and encouragement. Q 15 minute checks were done for safety. Pt did not attend group, remained in room sleeping. Pt had an incontinent episode while sleeping. Replaced linen and offered pt clean gowns. Minimally interactive with peers and staff.  Pt has no complaints.Pt receptive to treatment and safety maintained on unit.

## 2023-01-03 NOTE — Progress Notes (Signed)
   01/03/23 0559  15 Minute Checks  Location Bedroom  Visual Appearance Calm  Behavior Sleeping  Sleep (Behavioral Health Patients Only)  Calculate sleep? (Click Yes once per 24 hr at 0600 safety check) Yes  Documented sleep last 24 hours 7.75

## 2023-01-03 NOTE — [facility] Group Notes (Signed)
[facility] Group Notes:  (Nursing/MHT/Case Management/Adjunct)  Date:  01/03/2023  Time:  8:57 PM  Type of Therapy:   Wrap-up group  Participation Level:  Did Not Attend  Participation Quality:    Affect:    Cognitive:    Insight:    Engagement in Group:    Modes of Intervention:    Summary of Progress/Problems: Pt didn't attend group.  Noah Delaine 01/03/2023, 8:57 PM

## 2023-01-03 NOTE — Progress Notes (Signed)
EKG completed and placed on chart 

## 2023-01-04 ENCOUNTER — Encounter (HOSPITAL_COMMUNITY): Payer: Self-pay

## 2023-01-04 DIAGNOSIS — F250 Schizoaffective disorder, bipolar type: Secondary | ICD-10-CM | POA: Diagnosis not present

## 2023-01-04 NOTE — Progress Notes (Addendum)
Patient ID: Yolanda [name], female   DOB: July 18, 1975, 47 y.o.   MRN: [id number] [name] MD Progress Note  01/04/2023 11:41 AM Yolanda [name]  MRN:  [id number]  Reason for admission: This is the first psychiatric admission in this [name] in 12 years for this 59 AA female with an extensive hx of mental illnesses & probable polysubstance use disorders. She is admitted to the [facility] from the [facility] hospital with complain of worsening suicidal ideations with plan to stab herself. Per chart review, patient apparently reported at the ED that she has been depressed for a while & has not been taking her mental health medications. After medical evaluation.clearance, she was transferred to the [facility] for further psychiatric evaluation/treatments.  APS report on 6/24 Has payee, no legal guardian  Overnight events: another episode of urinary incontinence, malodorous, patient has chronic incontinence. No other acute behavioral events, no prn meds given for agitation. Prn atarax used last 6/25  Reported feeling "fine", her sleep was "fine".  Appetite is good, no problems reported.  She attended some of the groups yesterday with limited participation sec to her intellectual disability.  Compliant with meds and denies medication side effects. Reports feeling some depressed and anxious but appears calm, unable to elaborate further, denies passive or active si intention or plan, denies hi or avh.   Review of Systems  Eyes:  Negative for blurred vision and double vision.  Respiratory:  Negative for shortness of breath.   Cardiovascular:  Negative for chest pain.  Gastrointestinal:  Positive for constipation. Negative for abdominal pain, diarrhea, nausea and vomiting.  Neurological:  Negative for dizziness, seizures, weakness and headaches.  All other systems reviewed and are negative.   Principal Problem: Schizoaffective disorder, depressive type (HCC)   Diagnosis: Principal Problem:   Schizoaffective disorder,  depressive type (HCC) Active Problems:   GERD (gastroesophageal reflux disease)   Intellectual disability   Tobacco use disorder   Past Psychiatric History: See H&P.  Past Medical History:  Past Medical History:  Diagnosis Date   Bipolar affect, depressed (HCC)    Constipation 08/17/2022   Depression    Falls 07/21/2022   Fracture of femoral neck, right, closed (HCC) 01/17/2022   Herpes simplex 08/22/2017   Open fracture dislocation of right elbow joint 01/17/2022    Past Surgical History:  Procedure Laterality Date   NO PAST SURGERIES     SALPINGECTOMY     Family History: History reviewed. No pertinent family history.  Family Psychiatric  History: See H&P.  Social History:  Social History   Substance and Sexual Activity  Alcohol Use Yes     Social History   Substance and Sexual Activity  Drug Use Yes   Types: Cocaine, Marijuana    Social History   Socioeconomic History   Marital status: Single    Spouse name: Not on file   Number of children: Not on file   Years of education: Not on file   Highest education level: Not on file  Occupational History   Not on file  Tobacco Use   Smoking status: Every Day   Smokeless tobacco: Not on file  Substance and Sexual Activity   Alcohol use: Yes   Drug use: Yes    Types: Cocaine, Marijuana   Sexual activity: Yes  Other Topics Concern   Not on file  Social History Narrative   Not on file   Social Determinants of Health   Financial Resource Strain: Not on file  Food Insecurity:  Patient Declined (12/20/2022)   Hunger Vital Sign    Worried About Running Out of Food in the Last Year: Patient declined    Ran Out of Food in the Last Year: Patient declined  Transportation Needs: No Transportation Needs (12/20/2022)   PRAPARE - Administrator, Civil Service (Medical): No    Lack of Transportation (Non-Medical): No  Physical Activity: Not on file  Stress: Not on file  Social Connections: Not on file    Current Medications: Current Facility-Administered Medications  Medication Dose Route Frequency Provider Last Rate Last Admin   acetaminophen (TYLENOL) tablet 650 mg  650 mg Oral Q6H PRN [name], NP   650 mg at 12/29/22 1600   alum & mag hydroxide-simeth (MAALOX/MYLANTA) 200-200-20 MG/5ML suspension 30 mL  30 mL Oral Q4H PRN [name], NP       busPIRone (BUSPAR) tablet 15 mg  15 mg Oral BID [name], NP   15 mg at 01/04/23 0813   diphenhydrAMINE (BENADRYL) capsule 50 mg  50 mg Oral TID PRN [name], NP       Or   diphenhydrAMINE (BENADRYL) injection 50 mg  50 mg Intramuscular TID PRN [name], NP       haloperidol (HALDOL) tablet 5 mg  5 mg Oral TID PRN [name], NP       Or   haloperidol lactate (HALDOL) injection 5 mg  5 mg Intramuscular TID PRN [name], NP       hydrOXYzine (ATARAX) tablet 25 mg  25 mg Oral TID PRN [name], DO   25 mg at 01/01/23 2222   LORazepam (ATIVAN) tablet 2 mg  2 mg Oral TID PRN [name], NP       Or   LORazepam (ATIVAN) injection 2 mg  2 mg Intramuscular TID PRN [name], NP       magnesium hydroxide (MILK OF MAGNESIA) suspension 30 mL  30 mL Oral Daily PRN [name], NP       nicotine (NICODERM CQ - dosed in mg/24 hours) patch 21 mg  21 mg Transdermal Daily [name], DO   21 mg at 01/01/23 0753   paliperidone (INVEGA) 24 hr tablet 3 mg  3 mg Oral Daily [name], NP   3 mg at 01/04/23 0813   pantoprazole (PROTONIX) EC tablet 40 mg  40 mg Oral Daily [name], NP   40 mg at 01/04/23 0813   polyethylene glycol (MIRALAX / GLYCOLAX) packet 17 g  17 g Oral BID [name], DO   17 g at 01/04/23 4098   sertraline (ZOLOFT) tablet 100 mg  100 mg Oral Daily Park Pope, MD   100 mg at 01/04/23 0813   traZODone (DESYREL) tablet 50 mg  50 mg Oral QHS PRN [name], DO       Lab Results:  Results for orders placed or performed during the hospital encounter of 12/20/22 (from the past 48  hour(s))  CBC with Differential/Platelet     Status: Abnormal   Collection Time: 01/03/23  6:26 PM  Result Value Ref Range   WBC 5.5 4.0 - 10.5 K/uL   RBC 4.16 3.87 - 5.11 MIL/uL   Hemoglobin 11.9 (L) 12.0 - 15.0 g/dL   HCT 11.9 14.7 - 82.9 %   MCV 93.3 80.0 - 100.0 fL   MCH 28.6 26.0 - 34.0 pg   MCHC 30.7 30.0 - 36.0 g/dL   RDW 56.2 13.0 -  15.5 %   Platelets 158 150 - 400 K/uL   nRBC 0.0 0.0 - 0.2 %   Neutrophils Relative % 41 %   Neutro Abs 2.3 1.7 - 7.7 K/uL   Lymphocytes Relative 48 %   Lymphs Abs 2.6 0.7 - 4.0 K/uL   Monocytes Relative 6 %   Monocytes Absolute 0.3 0.1 - 1.0 K/uL   Eosinophils Relative 4 %   Eosinophils Absolute 0.2 0.0 - 0.5 K/uL   Basophils Relative 1 %   Basophils Absolute 0.0 0.0 - 0.1 K/uL   Immature Granulocytes 0 %   Abs Immature Granulocytes 0.01 0.00 - 0.07 K/uL    Comment: Performed at [name], [address], [place], [place] [address]  Comprehensive metabolic panel     Status: Abnormal   Collection Time: 01/03/23  6:26 PM  Result Value Ref Range   Sodium 140 135 - 145 mmol/L   Potassium 3.7 3.5 - 5.1 mmol/L   Chloride 104 98 - 111 mmol/L   CO2 29 22 - 32 mmol/L   Glucose, Bld 115 (H) 70 - 99 mg/dL    Comment: Glucose reference range applies only to samples taken after fasting for at least 8 hours.   BUN 13 6 - 20 mg/dL   Creatinine, Ser 5.40 0.44 - 1.00 mg/dL   Calcium 8.8 (L) 8.9 - 10.3 mg/dL   Total Protein 7.0 6.5 - 8.1 g/dL   Albumin 3.4 (L) 3.5 - 5.0 g/dL   AST 15 15 - 41 U/L   ALT 15 0 - 44 U/L   Alkaline Phosphatase 94 38 - 126 U/L   Total Bilirubin 0.4 0.3 - 1.2 mg/dL   GFR, Estimated >98 >11 mL/min    Comment: (NOTE) Calculated using the CKD-EPI Creatinine Equation (2021)    Anion gap 7 5 - 15    Comment: Performed at [facility], [address], [place], [place] [address]  Vitamin B12     Status: Abnormal   Collection Time: 01/03/23  6:26 PM  Result Value Ref Range   Vitamin  B-12 166 (L) 180 - 914 pg/mL    Comment: (NOTE) This assay is not validated for testing neonatal or myeloproliferative syndrome specimens for Vitamin B12 levels. Performed at [name], [address] [place], [place] [address]     Blood Alcohol level:  Lab Results  Component Value Date   [name] <10 12/19/2022   ETH <10 11/21/2019   Metabolic Disorder Labs: Lab Results  Component Value Date   HGBA1C 4.4 (L) 12/21/2022   MPG 79.58 12/21/2022   No results found for: "PROLACTIN" Lab Results  Component Value Date   CHOL 218 (H) 12/21/2022   TRIG 81 12/21/2022   HDL 53 12/21/2022   CHOLHDL 4.1 12/21/2022   VLDL 16 12/21/2022   LDLCALC 149 (H) 12/21/2022   LDLCALC 110 (H) 03/13/2011   Physical Findings: AIMS:  , ,  ,  ,     Musculoskeletal: Strength & Muscle Tone: within normal limits Gait & Station: normal Patient leans: N/A  Psychiatric Specialty Exam:  Presentation  General Appearance:  Disheveled (malodorous)  Eye Contact: Good  Speech: Clear and Coherent; Normal Rate  Speech Volume: Normal  Handedness: Right  Mood and Affect  Mood: Depressed; Anxious  Affect: Appropriate; Congruent; Full Range  Thought Process  Thought Processes: Coherent; Goal Directed; Linear  Descriptions of Associations:Intact  Orientation:Full (Time, Place and Person)  Thought Content:Rumination  History of Schizophrenia/Schizoaffective  disorder:Yes  Duration of Psychotic Symptoms:Greater than six months  Hallucinations:No data recorded  Ideas of Reference:None  Suicidal Thoughts:No data recorded  Homicidal Thoughts:No data recorded  Sensorium  Memory: Immediate Good  Judgment: Fair  Insight: Fair  Art therapist  Concentration: Good  Attention Span: Good  Recall: Good  Fund of Knowledge: Good  Language: Good  Psychomotor Activity  Psychomotor Activity: No data recorded  Assets  Assets: Desire for  Improvement; Leisure Time  Sleep  Sleep: No data recorded  Physical Exam: Physical Exam Vitals and nursing note reviewed.  Constitutional:      General: She is not in acute distress.    Appearance: She is not ill-appearing or diaphoretic.  HENT:     Head: Normocephalic.     Nose: No congestion.  Pulmonary:     Effort: Pulmonary effort is normal. No respiratory distress.  Neurological:     General: No focal deficit present.     Mental Status: She is alert.    Blood pressure 93/60, pulse 80, temperature 97.8 F (36.6 C), temperature source Oral, resp. rate 12, height 4\' 11"  (1.499 m), weight 63 kg, SpO2 100 %. Body mass index is 28.05 kg/m.  Treatment Plan Summary: Daily contact with patient to assess and evaluate symptoms and progress in treatment and Medication management.   Still waiting on safe dispo as patient would not be able to live independently given cognitive impairment. Recommend assisted living.   No medication side effects.  BP normalized, likely 2/2 machine error over the past day.  Because of urinary incontinence, recommend adult diapers.  Principal/active diagnoses:  Principal Problem:   Schizoaffective disorder, depressive type (HCC) Active Problems:   GERD (gastroesophageal reflux disease)   Intellectual disability   Tobacco use disorder (MOCA 16/30)  Plan: Re-initiated:  -Continue Buspar 15 mg po bid for anxiety.  -Continue paliperidone 3 mg po daily for mood control -Continued Sertraline 100 mg po Q daily for depression -Continue Trazodone 50 mg po Q bedtime prn for insomnia -Continue Nicoderm topically Q 24 hrs for nicotine withdrawal management  Cont to encourage wearing diapers Lab work and EKG, reviewed with no significant abnormalities noted.  Safety and Monitoring: Voluntary admission to inpatient psychiatric unit for safety, stabilization and treatment Daily contact with patient to assess and evaluate symptoms and progress in  treatment Patient's case to be discussed in multi-disciplinary team meeting Observation Level : q15 minute checks Vital signs: q12 hours Precautions: Safety   Discharge Planning: Social work and case management to assist with discharge planning and identification of hospital follow-up needs prior to discharge Estimated LOS: 5-7 days Discharge Concerns: Need to establish a safety plan; Medication compliance and effectiveness Discharge Goals: Return home with outpatient referrals for mental health follow-up including medication management/psychotherapy No legal guardian, has payee   Sarita Bottom, MD 01/04/2023, 11:41 AM

## 2023-01-04 NOTE — BH IP Treatment Plan (Signed)
Interdisciplinary Treatment and Diagnostic Plan Update  01/04/2023 Time of Session: 857 Yolanda [name] MRN: [id number]  Principal Diagnosis: Schizoaffective disorder, depressive type (HCC)  Secondary Diagnoses: Principal Problem:   Schizoaffective disorder, depressive type (HCC) Active Problems:   GERD (gastroesophageal reflux disease)   Intellectual disability   Tobacco use disorder   Current Medications:  Current Facility-Administered Medications  Medication Dose Route Frequency Provider Last Rate Last Admin   acetaminophen (TYLENOL) tablet 650 mg  650 mg Oral Q6H PRN [name], NP   650 mg at 12/29/22 1600   alum & mag hydroxide-simeth (MAALOX/MYLANTA) 200-200-20 MG/5ML suspension 30 mL  30 mL Oral Q4H PRN [name], NP       busPIRone (BUSPAR) tablet 15 mg  15 mg Oral BID [name], NP   15 mg at 01/04/23 0813   diphenhydrAMINE (BENADRYL) capsule 50 mg  50 mg Oral TID PRN [name], NP       Or   diphenhydrAMINE (BENADRYL) injection 50 mg  50 mg Intramuscular TID PRN [name], NP       haloperidol (HALDOL) tablet 5 mg  5 mg Oral TID PRN [name], NP       Or   haloperidol lactate (HALDOL) injection 5 mg  5 mg Intramuscular TID PRN [name], NP       hydrOXYzine (ATARAX) tablet 25 mg  25 mg Oral TID PRN [name], DO   25 mg at 01/01/23 2222   LORazepam (ATIVAN) tablet 2 mg  2 mg Oral TID PRN [name], NP       Or   LORazepam (ATIVAN) injection 2 mg  2 mg Intramuscular TID PRN [name], NP       magnesium hydroxide (MILK OF MAGNESIA) suspension 30 mL  30 mL Oral Daily PRN [name], NP       nicotine (NICODERM CQ - dosed in mg/24 hours) patch 21 mg  21 mg Transdermal Daily [name], DO   21 mg at 01/01/23 0753   paliperidone (INVEGA) 24 hr tablet 3 mg  3 mg Oral Daily Nwoko, Nicole Kindred I, NP   3 mg at 01/04/23 0813   pantoprazole (PROTONIX) EC tablet 40 mg  40 mg Oral Daily [name], NP   40 mg at 01/04/23 0813    polyethylene glycol (MIRALAX / GLYCOLAX) packet 17 g  17 g Oral BID [name], DO   17 g at 01/04/23 4098   sertraline (ZOLOFT) tablet 100 mg  100 mg Oral Daily Park Pope, MD   100 mg at 01/04/23 0813   traZODone (DESYREL) tablet 50 mg  50 mg Oral QHS PRN [name], DO       PTA Medications: Medications Prior to Admission  Medication Sig Dispense Refill Last Dose   busPIRone (BUSPAR) 15 MG tablet Take 15 mg by mouth 2 (two) times daily. (Patient not taking: Reported on 12/19/2022)      paliperidone (INVEGA SUSTENNA) 156 MG/ML SUSY injection Inject 156 mg into the muscle once. (Patient not taking: Reported on 12/19/2022)      sertraline (ZOLOFT) 50 MG tablet Take 150 mg by mouth daily. (Patient not taking: Reported on 12/19/2022)      traZODone (DESYREL) 100 MG tablet Take 100 mg by mouth at bedtime as needed for sleep. (Patient not taking: Reported on 11/12/2022)       Patient Stressors: Medication change or noncompliance    Patient Strengths: Forensic psychologist fund of  knowledge   Treatment Modalities: Medication Management, Group therapy, Case management,  1 to 1 session with clinician, Psychoeducation, Recreational therapy.   Physician Treatment Plan for Primary Diagnosis: Schizoaffective disorder, depressive type (HCC) Long Term Goal(s): Improvement in symptoms so as ready for discharge   Short Term Goals: Ability to identify and develop effective coping behaviors will improve Ability to maintain clinical measurements within normal limits will improve Compliance with prescribed medications will improve Ability to identify triggers associated with substance abuse/mental health issues will improve Ability to identify changes in lifestyle to reduce recurrence of condition will improve Ability to verbalize feelings will improve Ability to disclose and discuss suicidal ideas Ability to demonstrate self-control will improve  Medication Management: Evaluate patient's  response, side effects, and tolerance of medication regimen.  Therapeutic Interventions: 1 to 1 sessions, Unit Group sessions and Medication administration.  Evaluation of Outcomes: Progressing  Physician Treatment Plan for Secondary Diagnosis: Principal Problem:   Schizoaffective disorder, depressive type (HCC) Active Problems:   GERD (gastroesophageal reflux disease)   Intellectual disability   Tobacco use disorder  Long Term Goal(s): Improvement in symptoms so as ready for discharge   Short Term Goals: Ability to identify and develop effective coping behaviors will improve Ability to maintain clinical measurements within normal limits will improve Compliance with prescribed medications will improve Ability to identify triggers associated with substance abuse/mental health issues will improve Ability to identify changes in lifestyle to reduce recurrence of condition will improve Ability to verbalize feelings will improve Ability to disclose and discuss suicidal ideas Ability to demonstrate self-control will improve     Medication Management: Evaluate patient's response, side effects, and tolerance of medication regimen.  Therapeutic Interventions: 1 to 1 sessions, Unit Group sessions and Medication administration.  Evaluation of Outcomes: Progressing   RN Treatment Plan for Primary Diagnosis: Schizoaffective disorder, depressive type (HCC) Long Term Goal(s): Knowledge of disease and therapeutic regimen to maintain health will improve  Short Term Goals: Ability to remain free from injury will improve, Ability to verbalize frustration and anger appropriately will improve, Ability to demonstrate self-control, Ability to participate in decision making will improve, Ability to verbalize feelings will improve, Ability to disclose and discuss suicidal ideas, Ability to identify and develop effective coping behaviors will improve, and Compliance with prescribed medications will  improve  Medication Management: RN will administer medications as ordered by provider, will assess and evaluate patient's response and provide education to patient for prescribed medication. RN will report any adverse and/or side effects to prescribing provider.  Therapeutic Interventions: 1 on 1 counseling sessions, Psychoeducation, Medication administration, Evaluate responses to treatment, Monitor vital signs and CBGs as ordered, Perform/monitor CIWA, COWS, AIMS and Fall Risk screenings as ordered, Perform wound care treatments as ordered.  Evaluation of Outcomes: Progressing   LCSW Treatment Plan for Primary Diagnosis: Schizoaffective disorder, depressive type (HCC) Long Term Goal(s): Safe transition to appropriate next level of care at discharge, Engage patient in therapeutic group addressing interpersonal concerns.  Short Term Goals: Engage patient in aftercare planning with referrals and resources, Increase social support, Increase ability to appropriately verbalize feelings, Increase emotional regulation, Facilitate acceptance of mental health diagnosis and concerns, Facilitate patient progression through stages of change regarding substance use diagnoses and concerns, Identify triggers associated with mental health/substance abuse issues, and Increase skills for wellness and recovery  Therapeutic Interventions: Assess for all discharge needs, 1 to 1 time with Social worker, Explore available resources and support systems, Assess for adequacy in community support network, Educate  family and significant other(s) on suicide prevention, Complete Psychosocial Assessment, Interpersonal group therapy.  Evaluation of Outcomes: Progressing   Progress in Treatment: Attending groups: Yes. Participating in groups: Yes. Taking medication as prescribed: Yes. Toleration medication: Yes. Family/Significant other contact made: Yes, individual(s) contacted:  Lynia Lasser (Mom) [phone] Patient  understands diagnosis: No. Discussing patient identified problems/goals with staff: Yes. Medical problems stabilized or resolved: Yes. Denies suicidal/homicidal ideation: Yes. Issues/concerns per patient self-inventory: Yes. Other: N/A  New problem(s) identified: Yes, Describe:  Incontinence   New Short Term/Long Term Goal(s):medication stabilization, elimination of SI thoughts, development of comprehensive mental wellness plan.   Patient Goals:  Coping Skills  Discharge Plan or Barriers: Patient recently admitted. CSW will continue to follow and assess for appropriate referrals and possible discharge planning.   Reason for Continuation of Hospitalization: Anxiety Depression Medication stabilization Suicidal ideation  Estimated Length of Stay:  Last 3 [place] Suicide Severity Risk Score: Flowsheet Row [facility] (Current) [facility] from 12/19/2022 in [facility] from 11/12/2022 in [facility] Department at [facility]  C-SSRS RISK CATEGORY High Risk High Risk Moderate Risk       Last [facility] 2/9 Scores:     No data to display          medication stabilization, elimination of SI thoughts, development of comprehensive mental wellness plan.   Scribe for Treatment Team: Ane Payment, LCSW 01/04/2023 8:57 AM

## 2023-01-04 NOTE — Progress Notes (Signed)
   01/04/23 2200  Psych Admission Type (Psych Patients Only)  Admission Status Voluntary  Psychosocial Assessment  Patient Complaints Isolation;Depression  Eye Contact Brief  Facial Expression Flat  Affect Appropriate to circumstance  Speech Soft;Slow  Interaction Childlike;Minimal;Isolative  Motor Activity Slow;Tremors  Appearance/Hygiene Body odor;Disheveled;Poor hygiene  Behavior Characteristics Cooperative  Mood Depressed  Thought Process  Coherency WDL  Content WDL  Delusions None reported or observed  Perception WDL  Hallucination None reported or observed  Judgment Limited  Confusion Mild  Danger to Self  Current suicidal ideation? Denies  Self-Injurious Behavior No self-injurious ideation or behavior indicators observed or expressed   Agreement Not to Harm Self Yes  Description of Agreement verbal  Danger to Others  Danger to Others None reported or observed

## 2023-01-04 NOTE — Plan of Care (Signed)
  Problem: Activity: Goal: Interest or engagement in leisure activities will improve Outcome: Not Progressing   Problem: Health Behavior/Discharge Planning: Goal: Compliance with therapeutic regimen will improve Outcome: Progressing   Problem: Medication: Goal: Compliance with prescribed medication regimen will improve Outcome: Progressing

## 2023-01-04 NOTE — Group Note (Signed)
Recreation Therapy Group Note   Group Topic:Team Building  Group Date: 01/04/2023 Start Time: 0940 End Time: 1012 Facilitators: Tremel Setters-McCall, LRT,CTRS Location: [address] Day[address]   Goal Area(s) Addresses:  Patient will effectively work with peer towards shared goal.  Patient will identify skills used to make activity successful.  Patient will identify how skills used during activity can be applied to reach post d/c goals.    Group Description: [employer]. In teams of 5-6, patients were given 11 craft pipe cleaners. Using the materials provided, patients were instructed to compete again the opposing team(s) to build the tallest free-standing structure from floor level. The activity was timed; difficulty increased by Clinical research associate as Production designer, theatre/television/film continued.  Systematically resources were removed with additional directions for example, placing one arm behind their back, working in silence, and shape stipulations. LRT facilitated post-activity discussion reviewing team processes and necessary communication skills involved in completion. Patients were encouraged to reflect how the skills utilized, or not utilized, in this activity can be incorporated to positively impact support systems post discharge.   Affect/Mood: N/A   Participation Level: Did not attend    Clinical Observations/Individualized Feedback:     Plan: Continue to engage patient in RT group sessions 2-3x/week.   Karmine Kauer-McCall, LRT,CTRS 01/04/2023 11:24 AM

## 2023-01-04 NOTE — [facility] Group Notes (Signed)
[facility] Group Notes:  (Nursing/MHT/Case Management/Adjunct)  Date:  01/04/2023  Time:  8:06 PM  Type of Therapy:   AA group  Participation Level:  Did Not Attend  Participation Quality:    Affect:    Cognitive:    Insight:    Engagement in Group:    Modes of Intervention:    Summary of Progress/Problems: didn't attend AA meeting.  Yolanda [name] 01/04/2023, 8:06 PM

## 2023-01-04 NOTE — Progress Notes (Signed)
   01/04/23 1044  Psych Admission Type (Psych Patients Only)  Admission Status Voluntary  Psychosocial Assessment  Patient Complaints Isolation;Depression  Eye Contact Fair  Facial Expression Flat  Affect Flat  Speech Soft;Slow  Interaction Childlike;Isolative  Motor Activity Slow;Tremors  Appearance/Hygiene Body odor;Disheveled  Behavior Characteristics Cooperative  Mood Depressed  Thought Process  Coherency WDL  Content WDL  Delusions None reported or observed  Perception WDL  Hallucination None reported or observed  Judgment Limited  Confusion Mild  Danger to Self  Current suicidal ideation? Denies  Self-Injurious Behavior No self-injurious ideation or behavior indicators observed or expressed   Agreement Not to Harm Self Yes  Description of Agreement verbally contracts for safety  Danger to Others  Danger to Others None reported or observed

## 2023-01-04 NOTE — Progress Notes (Signed)
Patient ID: Yolanda [name], female   DOB: February 17, 1976, 47 y.o.   MRN: [id number]   Pt at nurses' station c/o anxiety. RN observed pt highly anxious and offered medication. Pt was given Hydroxyzine 25 mg PRN.

## 2023-01-05 DIAGNOSIS — F250 Schizoaffective disorder, bipolar type: Principal | ICD-10-CM

## 2023-01-05 MED ORDER — CYANOCOBALAMIN 1000 MCG/ML IJ SOLN
1000.0000 ug | INTRAMUSCULAR | Status: DC
  Administered 2023-02-02 – 2023-08-01 (×7): 1000 ug via INTRAMUSCULAR
  Filled 2023-01-05 (×8): qty 1

## 2023-01-05 MED ORDER — CYANOCOBALAMIN 1000 MCG/ML IJ SOLN
1000.0000 ug | INTRAMUSCULAR | Status: AC
  Administered 2023-01-05 – 2023-01-26 (×4): 1000 ug via INTRAMUSCULAR
  Filled 2023-01-05 (×6): qty 1

## 2023-01-05 NOTE — Group Note (Signed)
Date:  01/05/2023 Time:  5:53 PM  Group Topic/Focus:  Goals Group:   The focus of this group is to help patients establish daily goals to achieve during treatment and discuss how the patient can incorporate goal setting into their daily lives to aide in recovery. Orientation:   The focus of this group is to educate the patient on the purpose and policies of crisis stabilization and provide a format to answer questions about their admission.  The group details unit policies and expectations of patients while admitted.    Participation Level:  Did Not Attend  Participation Quality:   n/a  Affect:   n/a  Cognitive:   n/a  Insight: None  Engagement in Group:   n/a  Modes of Intervention:   n/a  Additional Comments:   Pt did not attend  Stark Bray 01/05/2023, 5:53 PM

## 2023-01-05 NOTE — Group Note (Signed)
Date:  01/05/2023 Time:  6:37 PM  Group Topic/Focus:  Dimensions of Wellness:   The focus of this group is to introduce the topic of wellness and discuss the role each dimension of wellness plays in total health.    Participation Level:  Did Not Attend  Participation Quality:   n/a  Affect:   n/a  Cognitive:   n/a  Insight: None  Engagement in Group:   n/a  Modes of Intervention:   n/a  Additional Comments:   Pt did not attend.  Edmund Hilda Nuno Brubacher 01/05/2023, 6:37 PM

## 2023-01-05 NOTE — Progress Notes (Signed)
   01/05/23 0900  Psych Admission Type (Psych Patients Only)  Admission Status Voluntary  Psychosocial Assessment  Patient Complaints Depression;Isolation  Eye Contact Brief  Facial Expression Sad  Affect Depressed  Speech Soft  Interaction Childlike;Minimal;Isolative  Motor Activity Tremors  Appearance/Hygiene Poor hygiene  Behavior Characteristics Cooperative  Mood Depressed  Thought Process  Coherency WDL  Content WDL  Delusions None reported or observed  Perception WDL  Hallucination None reported or observed  Judgment Limited  Confusion Mild  Danger to Self  Current suicidal ideation? Denies  Self-Injurious Behavior No self-injurious ideation or behavior indicators observed or expressed   Agreement Not to Harm Self Yes  Description of Agreement agrees to contact staff before acting on harmful thoughts  Danger to Others  Danger to Others None reported or observed

## 2023-01-05 NOTE — Progress Notes (Signed)
   01/05/23 2154  Psych Admission Type (Psych Patients Only)  Admission Status Voluntary  Psychosocial Assessment  Patient Complaints Depression  Eye Contact Brief  Facial Expression Flat  Affect Appropriate to circumstance  Speech Logical/coherent  Interaction Minimal;Isolative  Appearance/Hygiene Poor hygiene;Disheveled  Behavior Characteristics Cooperative  Mood Depressed;Pleasant  Thought Process  Coherency WDL  Content WDL  Delusions None reported or observed  Perception WDL  Hallucination None reported or observed  Judgment Limited  Confusion None  Danger to Self  Current suicidal ideation? Denies  Self-Injurious Behavior No self-injurious ideation or behavior indicators observed or expressed   Agreement Not to Harm Self Yes  Description of Agreement verbal  Danger to Others  Danger to Others None reported or observed

## 2023-01-05 NOTE — Progress Notes (Addendum)
Patient ID: Yolanda [name], female   DOB: 07-04-1976, 47 y.o.   MRN: [id number] [facility] MD Progress Note  01/05/2023 9:29 AM Yolanda [name]  MRN:  [id number]  Reason for admission: This is the first psychiatric admission in this [facility] in 12 years for this 23 AA female with an extensive hx of mental illnesses & probable polysubstance use disorders. She is admitted to the [facility] from the [facility] hospital with complain of worsening suicidal ideations with plan to stab herself. Per chart review, patient apparently reported at the ED that she has been depressed for a while & has not been taking her mental health medications. After medical evaluation.clearance, she was transferred to the [facility] for further psychiatric evaluation/treatments.  APS report on 6/24 Has payee, no legal guardian  Overnight events: No episodes of agitation or behavioral disturbances reported, no as needed medication given for agitation or aggression, as needed Atarax for anxiety was given clients on 6/28, as needed trazodone for sleep was given on 6/28 at 9 PM Vital signs reviewed, blood pressure stable at this time, will follow  Upon evaluation today patient is lying down in bed asleep but easily awakened smiling appropriately reporting feeling good in general reporting some depressed mood related to being in the hospital, reports fair sleep and appetite, denies SI HI or AVH denies feeling hopeless or helpless or wishing self dead.  Does not appear responding to stimuli or paranoid or disorganized for presentation consistent with moderate intellectual disability at baseline.  She has history of chronic incontinence, I encouraged her to wear diapers, will follow. She denies any ongoing constipation, per staff she had large bowel movement last night. Patient's room has a strong urine smell probably related to episodes of incontinence, patient was encouraged to shower and wear diapers, will follow.  Review of Systems  Eyes:  Negative  for blurred vision and double vision.  Respiratory:  Negative for shortness of breath.   Cardiovascular:  Negative for chest pain.  Gastrointestinal:  Negative for abdominal pain, constipation, diarrhea, nausea and vomiting.  Neurological:  Negative for dizziness, seizures, weakness and headaches.  All other systems reviewed and are negative.   Principal Problem: Schizoaffective disorder, depressive type (HCC)   Diagnosis: Principal Problem:   Schizoaffective disorder, depressive type (HCC) Active Problems:   GERD (gastroesophageal reflux disease)   Intellectual disability   Tobacco use disorder  Total Time spent with patient: see attending attestation  Past Psychiatric History: See H&P.  Past Medical History:  Past Medical History:  Diagnosis Date   Bipolar affect, depressed (HCC)    Constipation 08/17/2022   Depression    Falls 07/21/2022   Fracture of femoral neck, right, closed (HCC) 01/17/2022   Herpes simplex 08/22/2017   Open fracture dislocation of right elbow joint 01/17/2022    Past Surgical History:  Procedure Laterality Date   NO PAST SURGERIES     SALPINGECTOMY     Family History: History reviewed. No pertinent family history.  Family Psychiatric  History: See H&P.  Social History:  Social History   Substance and Sexual Activity  Alcohol Use Yes     Social History   Substance and Sexual Activity  Drug Use Yes   Types: Cocaine, Marijuana    Social History   Socioeconomic History   Marital status: Single    Spouse name: Not on file   Number of children: Not on file   Years of education: Not on file   Highest education level: Not on  file  Occupational History   Not on file  Tobacco Use   Smoking status: Every Day   Smokeless tobacco: Not on file  Substance and Sexual Activity   Alcohol use: Yes   Drug use: Yes    Types: Cocaine, Marijuana   Sexual activity: Yes  Other Topics Concern   Not on file  Social History Narrative   Not on  file   Social Determinants of Health   Financial Resource Strain: Not on file  Food Insecurity: Patient Declined (12/20/2022)   Hunger Vital Sign    Worried About Running Out of Food in the Last Year: Patient declined    Ran Out of Food in the Last Year: Patient declined  Transportation Needs: No Transportation Needs (12/20/2022)   PRAPARE - Administrator, Civil Service (Medical): No    Lack of Transportation (Non-Medical): No  Physical Activity: Not on file  Stress: Not on file  Social Connections: Not on file   Current Medications: Current Facility-Administered Medications  Medication Dose Route Frequency Provider Last Rate Last Admin   acetaminophen (TYLENOL) tablet 650 mg  650 mg Oral Q6H PRN [name], NP   650 mg at 12/29/22 1600   alum & mag hydroxide-simeth (MAALOX/MYLANTA) 200-200-20 MG/5ML suspension 30 mL  30 mL Oral Q4H PRN [name], NP       busPIRone (BUSPAR) tablet 15 mg  15 mg Oral BID [name], NP   15 mg at 01/05/23 1610   diphenhydrAMINE (BENADRYL) capsule 50 mg  50 mg Oral TID PRN [name], NP       Or   diphenhydrAMINE (BENADRYL) injection 50 mg  50 mg Intramuscular TID PRN [name], NP       haloperidol (HALDOL) tablet 5 mg  5 mg Oral TID PRN [name], NP       Or   haloperidol lactate (HALDOL) injection 5 mg  5 mg Intramuscular TID PRN [name], NP       hydrOXYzine (ATARAX) tablet 25 mg  25 mg Oral TID PRN [name], DO   25 mg at 01/04/23 2120   LORazepam (ATIVAN) tablet 2 mg  2 mg Oral TID PRN [name], NP       Or   LORazepam (ATIVAN) injection 2 mg  2 mg Intramuscular TID PRN [name], NP       magnesium hydroxide (MILK OF MAGNESIA) suspension 30 mL  30 mL Oral Daily PRN [name], NP       nicotine (NICODERM CQ - dosed in mg/24 hours) patch 21 mg  21 mg Transdermal Daily [name], DO   21 mg at 01/01/23 0753   paliperidone (INVEGA) 24 hr tablet 3 mg  3 mg Oral Daily [name], NP    3 mg at 01/05/23 0811   pantoprazole (PROTONIX) EC tablet 40 mg  40 mg Oral Daily [name], NP   40 mg at 01/05/23 0811   polyethylene glycol (MIRALAX / GLYCOLAX) packet 17 g  17 g Oral BID [name], DO   17 g at 01/05/23 9604   sertraline (ZOLOFT) tablet 100 mg  100 mg Oral Daily Park Pope, MD   100 mg at 01/05/23 5409   traZODone (DESYREL) tablet 50 mg  50 mg Oral QHS PRN [name], DO   50 mg at 01/04/23 2121   Lab Results:  Results for orders placed or performed during the hospital encounter of 12/20/22 (from  the past 48 hour(s))  CBC with Differential/Platelet     Status: Abnormal   Collection Time: 01/03/23  6:26 PM  Result Value Ref Range   WBC 5.5 4.0 - 10.5 K/uL   RBC 4.16 3.87 - 5.11 MIL/uL   Hemoglobin 11.9 (L) 12.0 - 15.0 g/dL   HCT 16.1 09.6 - 04.5 %   MCV 93.3 80.0 - 100.0 fL   MCH 28.6 26.0 - 34.0 pg   MCHC 30.7 30.0 - 36.0 g/dL   RDW 40.9 81.1 - 91.4 %   Platelets 158 150 - 400 K/uL   nRBC 0.0 0.0 - 0.2 %   Neutrophils Relative % 41 %   Neutro Abs 2.3 1.7 - 7.7 K/uL   Lymphocytes Relative 48 %   Lymphs Abs 2.6 0.7 - 4.0 K/uL   Monocytes Relative 6 %   Monocytes Absolute 0.3 0.1 - 1.0 K/uL   Eosinophils Relative 4 %   Eosinophils Absolute 0.2 0.0 - 0.5 K/uL   Basophils Relative 1 %   Basophils Absolute 0.0 0.0 - 0.1 K/uL   Immature Granulocytes 0 %   Abs Immature Granulocytes 0.01 0.00 - 0.07 K/uL    Comment: Performed at [facility], [address], [place], [place] [address]  Comprehensive metabolic panel     Status: Abnormal   Collection Time: 01/03/23  6:26 PM  Result Value Ref Range   Sodium 140 135 - 145 mmol/L   Potassium 3.7 3.5 - 5.1 mmol/L   Chloride 104 98 - 111 mmol/L   CO2 29 22 - 32 mmol/L   Glucose, Bld 115 (H) 70 - 99 mg/dL    Comment: Glucose reference range applies only to samples taken after fasting for at least 8 hours.   BUN 13 6 - 20 mg/dL   Creatinine, Ser 6.21 0.44 - 1.00 mg/dL   Calcium 8.8 (L)  8.9 - 10.3 mg/dL   Total Protein 7.0 6.5 - 8.1 g/dL   Albumin 3.4 (L) 3.5 - 5.0 g/dL   AST 15 15 - 41 U/L   ALT 15 0 - 44 U/L   Alkaline Phosphatase 94 38 - 126 U/L   Total Bilirubin 0.4 0.3 - 1.2 mg/dL   GFR, Estimated >30 >86 mL/min    Comment: (NOTE) Calculated using the CKD-EPI Creatinine Equation (2021)    Anion gap 7 5 - 15    Comment: Performed at [facility], [address], [place], [place] [address]  Vitamin B12     Status: Abnormal   Collection Time: 01/03/23  6:26 PM  Result Value Ref Range   Vitamin B-12 166 (L) 180 - 914 pg/mL    Comment: (NOTE) This assay is not validated for testing neonatal or myeloproliferative syndrome specimens for Vitamin B12 levels. Performed at [facility], [address] [place], [place] [address]     Blood Alcohol level:  Lab Results  Component Value Date   [facility] <10 12/19/2022   ETH <10 11/21/2019   Metabolic Disorder Labs: Lab Results  Component Value Date   HGBA1C 4.4 (L) 12/21/2022   MPG 79.58 12/21/2022   No results found for: "PROLACTIN" Lab Results  Component Value Date   CHOL 218 (H) 12/21/2022   TRIG 81 12/21/2022   HDL 53 12/21/2022   CHOLHDL 4.1 12/21/2022   VLDL 16 12/21/2022   LDLCALC 149 (H) 12/21/2022   LDLCALC 110 (H) 03/13/2011   Physical Findings: AIMS:  , ,  ,  ,  Musculoskeletal: Strength & Muscle Tone: within normal limits Gait & Station: normal Patient leans: N/A  Psychiatric Specialty Exam:  Presentation  General Appearance:  Disheveled (malodorous)  Eye Contact: Good  Speech: Clear and Coherent; Normal Rate  Speech Volume: Normal  Handedness: Right  Mood and Affect  Mood: Depressed; Anxious  Affect: Appropriate; Congruent; Full Range  Thought Process  Thought Processes: Coherent; Goal Directed; Linear  Descriptions of Associations:Intact  Orientation:Full (Time, Place and Person)  Thought Content:Rumination  History of  Schizophrenia/Schizoaffective disorder:Yes  Duration of Psychotic Symptoms:Greater than six months  Hallucinations:No data recorded  Ideas of Reference:None  Suicidal Thoughts:No data recorded  Homicidal Thoughts:No data recorded  Sensorium  Memory: Immediate Good  Judgment: Fair  Insight: Fair  Art therapist  Concentration: Good  Attention Span: Good  Recall: Good  Fund of Knowledge: Good  Language: Good  Psychomotor Activity  Psychomotor Activity: No data recorded  Assets  Assets: Desire for Improvement; Leisure Time  Sleep  Sleep: No data recorded  Physical Exam: Physical Exam Vitals and nursing note reviewed.  Constitutional:      General: She is not in acute distress.    Appearance: She is not ill-appearing or diaphoretic.  HENT:     Head: Normocephalic.     Nose: No congestion.  Pulmonary:     Effort: Pulmonary effort is normal. No respiratory distress.  Neurological:     General: No focal deficit present.     Mental Status: She is alert.    Blood pressure 118/87, pulse (!) 59, temperature 98.4 F (36.9 C), temperature source Oral, resp. rate 16, height 4\' 11"  (1.499 m), weight 63 kg, SpO2 (!) 87 %. Body mass index is 28.05 kg/m.  Treatment Plan Summary: Daily contact with patient to assess and evaluate symptoms and progress in treatment and Medication management.   Still waiting on safe dispo as patient would not be able to live independently given cognitive impairment. Recommend assisted living.   No medication side effects.  BP normalized, likely 2/2 machine error over the past day.  Because of urinary incontinence, recommend adult diapers.  Principal/active diagnoses:  Principal Problem:   Schizoaffective disorder, depressive type (HCC) Active Problems:   GERD (gastroesophageal reflux disease)   Intellectual disability   Tobacco use disorder (MOCA 16/30) moderate cognitive impairment probably related to moderate  intellectual disability.  Plan: Re-initiated:  -Continue Buspar 15 mg po bid for anxiety.  -Continue paliperidone 3 mg po daily for mood control -Continued Sertraline 100 mg po Q daily for depression -Continue Trazodone 50 mg po Q bedtime prn for insomnia -Continue Nicoderm topically Q 24 hrs for nicotine withdrawal management Start vitamin B12 1000 mcg IM weekly for 4 weeks then monthly  Cont to encourage wearing diapers Lab work and EKG, reviewed with no significant abnormalities noted, exc ept slightly low b12 166.  Safety and Monitoring: Voluntary admission to inpatient psychiatric unit for safety, stabilization and treatment Daily contact with patient to assess and evaluate symptoms and progress in treatment Patient's case to be discussed in multi-disciplinary team meeting Observation Level : q15 minute checks Vital signs: q12 hours Precautions: Safety   Discharge Planning: Social work and case management to assist with discharge planning and identification of hospital follow-up needs prior to discharge Estimated LOS: 5-7 days Discharge Concerns: Need to establish a safety plan; Medication compliance and effectiveness Discharge Goals: Return home with outpatient referrals for mental health follow-up including medication management/psychotherapy No legal guardian, has payee   Sarita Bottom, MD 01/05/2023,  9:29 AM

## 2023-01-05 NOTE — [facility] Group Notes (Signed)
[facility] Group Notes:  (Nursing/MHT/Case Management/Adjunct)  Date:  01/05/2023  Time:  9:23 PM  Type of Therapy:   Wrap-up group  Participation Level:  Did Not Attend  Participation Quality:    Affect:    Cognitive:    Insight:    Engagement in Group:    Modes of Intervention:    Summary of Progress/Problems: Pt didn't attend.   Noah Delaine 01/05/2023, 9:23 PM

## 2023-01-06 DIAGNOSIS — F250 Schizoaffective disorder, bipolar type: Secondary | ICD-10-CM | POA: Diagnosis not present

## 2023-01-06 NOTE — Progress Notes (Signed)
   01/06/23 2143  Psych Admission Type (Psych Patients Only)  Admission Status Voluntary  Psychosocial Assessment  Patient Complaints Depression  Eye Contact Fair  Facial Expression Animated  Affect Appropriate to circumstance  Speech Logical/coherent  Interaction Childlike  Motor Activity Tremors;Slow  Appearance/Hygiene Improved  Behavior Characteristics Appropriate to situation  Mood Depressed;Pleasant  Thought Process  Coherency WDL  Content WDL  Delusions None reported or observed  Perception WDL  Hallucination None reported or observed  Judgment Limited  Confusion None  Danger to Self  Current suicidal ideation? Denies  Self-Injurious Behavior No self-injurious ideation or behavior indicators observed or expressed   Agreement Not to Harm Self Yes  Description of Agreement verbal  Danger to Others  Danger to Others None reported or observed

## 2023-01-06 NOTE — [facility] Group Notes (Addendum)
[facility] Group Notes:  (Nursing/)  Date:  01/06/2023  Time:  1400  Type of Therapy:  Nurse Education  Participation Level:  Active  Participation Quality:  Attentive and Sharing  Affect:  Appropriate  Cognitive:  Alert  Insight:  Improving  Engagement in Group:  Engaged and Improving  Modes of Intervention:  Discussion, Education, Exploration, and Support  Summary of Progress/Problems:  Healthy relationships  What makes for a healthy relationship? How to identify a unhealthy relationship?   Shela Nevin 01/06/2023, 2:25 PM

## 2023-01-06 NOTE — [facility] Group Notes (Signed)
Pt did not attend group discussion 

## 2023-01-06 NOTE — Progress Notes (Signed)
   01/06/23 1100  Psych Admission Type (Psych Patients Only)  Admission Status Voluntary  Psychosocial Assessment  Patient Complaints Sadness  Eye Contact Fair  Facial Expression Animated  Affect Appropriate to circumstance  Speech Logical/coherent  Interaction Childlike;Isolative  Motor Activity Tremors  Appearance/Hygiene Improved  Behavior Characteristics Cooperative  Mood Pleasant;Sad  Thought Process  Coherency WDL  Content WDL  Delusions None reported or observed  Perception WDL  Hallucination None reported or observed  Judgment Limited  Confusion None  Danger to Self  Current suicidal ideation? Denies  Self-Injurious Behavior No self-injurious ideation or behavior indicators observed or expressed   Agreement Not to Harm Self Yes  Description of Agreement agrees to contact staff before acting on harmful thoughs  Danger to Others  Danger to Others None reported or observed

## 2023-01-06 NOTE — Group Note (Signed)
[facility] LCSW Group Therapy Note    Group Date: 01/06/2023 Start Time: 1000 End Time: 1100  Type of Therapy and Topic:  Group Therapy:  Overcoming Obstacles  Participation Level:  [facility] PARTICIPATION LEVEL: Did Not Attend    Description of Group:   In this group patients will be encouraged to explore what they see as obstacles to their own wellness and recovery. They will be guided to discuss their thoughts, feelings, and behaviors related to these obstacles. The group will process together ways to cope with barriers, with attention given to specific choices patients can make. Each patient will be challenged to identify changes they are motivated to make in order to overcome their obstacles. This group will be process-oriented, with patients participating in exploration of their own experiences as well as giving and receiving support and challenge from other group members.  Therapeutic Goals: 1. Patient will identify personal and current obstacles as they relate to admission. 2. Patient will identify barriers that currently interfere with their wellness or overcoming obstacles.  3. Patient will identify feelings, thought process and behaviors related to these barriers. 4. Patient will identify two changes they are willing to make to overcome these obstacles:    Summary of Patient Progress Pt did not attend.    Therapeutic Modalities:   Cognitive Behavioral Therapy Solution Focused Therapy Motivational Interviewing Relapse Prevention Therapy   Otelia Santee, LCSW

## 2023-01-06 NOTE — Progress Notes (Signed)
Patient ID: Yolanda [name], female   DOB: 1976/03/05, 47 y.o.   MRN: [id number] [facility] MD Progress Note  01/06/2023 9:31 AM Yolanda [name]  MRN:  [id number]  Reason for admission: This is the first psychiatric admission in this [facility] in 12 years for this 15 AA female with an extensive hx of mental illnesses & probable polysubstance use disorders. She is admitted to the [facility] from the [facility] hospital with complain of worsening suicidal ideations with plan to stab herself. Per chart review, patient apparently reported at the ED that she has been depressed for a while & has not been taking her mental health medications. After medical evaluation.clearance, she was transferred to the [facility] for further psychiatric evaluation/treatments.  APS report on 6/24 Has payee, no legal guardian  Overnight events: No episodes of agitation or behavioral disturbances reported, no as needed medication given for agitation or aggression, as needed Atarax for anxiety was given last 6/29, being used average once to twice daily, as needed trazodone for sleep given almost nightly Vital signs reviewed, blood pressure stable at this time with occasional low blood pressure but patient remains asymptomatic, will follow  Upon evaluation today patient is lying down in bed awake, answers questions pleasantly, continues to report feeling "good" reports good sleep and appetite reports some depressed mood and anxiety mainly related to being in the hospital asking about discharge plan, redirected patient that currently we are awaiting to be able to place her for long-term placement to be able to discharge her.  She denies passive or active SI intention or plan denies feeling hopeless or helpless.  Encouraged patient to participate more in groups and interact in the milieu more frequently, will follow.  Denies any symptoms consistent with mania or hypomania, denies HI or AVH or paranoia or other delusions.  Encouraged better self hygiene  and daily showers, will follow.  ROS: 9 Systems reviewed negative   Principal Problem: Schizoaffective disorder, depressive type (HCC)   Diagnosis: Principal Problem:   Schizoaffective disorder, depressive type (HCC) Active Problems:   GERD (gastroesophageal reflux disease)   Intellectual disability   Tobacco use disorder   Past Psychiatric History: See H&P.  Past Medical History:  Past Medical History:  Diagnosis Date   Bipolar affect, depressed (HCC)    Constipation 08/17/2022   Depression    Falls 07/21/2022   Fracture of femoral neck, right, closed (HCC) 01/17/2022   Herpes simplex 08/22/2017   Open fracture dislocation of right elbow joint 01/17/2022    Past Surgical History:  Procedure Laterality Date   NO PAST SURGERIES     SALPINGECTOMY     Family History: History reviewed. No pertinent family history.  Family Psychiatric  History: See H&P.  Social History:  Social History   Substance and Sexual Activity  Alcohol Use Yes     Social History   Substance and Sexual Activity  Drug Use Yes   Types: Cocaine, Marijuana    Social History   Socioeconomic History   Marital status: Single    Spouse name: Not on file   Number of children: Not on file   Years of education: Not on file   Highest education level: Not on file  Occupational History   Not on file  Tobacco Use   Smoking status: Every Day   Smokeless tobacco: Not on file  Substance and Sexual Activity   Alcohol use: Yes   Drug use: Yes    Types: Cocaine, Marijuana   Sexual activity:  Yes  Other Topics Concern   Not on file  Social History Narrative   Not on file   Social Determinants of Health   Financial Resource Strain: Not on file  Food Insecurity: Patient Declined (12/20/2022)   Hunger Vital Sign    Worried About Running Out of Food in the Last Year: Patient declined    Ran Out of Food in the Last Year: Patient declined  Transportation Needs: No Transportation Needs (12/20/2022)    PRAPARE - Administrator, Civil Service (Medical): No    Lack of Transportation (Non-Medical): No  Physical Activity: Not on file  Stress: Not on file  Social Connections: Not on file   Current Medications: Current Facility-Administered Medications  Medication Dose Route Frequency Provider Last Rate Last Admin   acetaminophen (TYLENOL) tablet 650 mg  650 mg Oral Q6H PRN [name], NP   650 mg at 12/29/22 1600   alum & mag hydroxide-simeth (MAALOX/MYLANTA) 200-200-20 MG/5ML suspension 30 mL  30 mL Oral Q4H PRN [name], NP       busPIRone (BUSPAR) tablet 15 mg  15 mg Oral BID [name], NP   15 mg at 01/06/23 4098   cyanocobalamin (VITAMIN B12) injection 1,000 mcg  1,000 mcg Intramuscular Weekly [name], Jabes Primo, MD   1,000 mcg at 01/05/23 1049   Followed by   Melene Muller ON 02/02/2023] cyanocobalamin (VITAMIN B12) injection 1,000 mcg  1,000 mcg Intramuscular Q30 days [name], Dia Donate, MD       diphenhydrAMINE (BENADRYL) capsule 50 mg  50 mg Oral TID PRN [name], NP       Or   diphenhydrAMINE (BENADRYL) injection 50 mg  50 mg Intramuscular TID PRN [name], NP       haloperidol (HALDOL) tablet 5 mg  5 mg Oral TID PRN [name], NP       Or   haloperidol lactate (HALDOL) injection 5 mg  5 mg Intramuscular TID PRN [name], NP       hydrOXYzine (ATARAX) tablet 25 mg  25 mg Oral TID PRN [name], DO   25 mg at 01/05/23 2111   LORazepam (ATIVAN) tablet 2 mg  2 mg Oral TID PRN [name], NP       Or   LORazepam (ATIVAN) injection 2 mg  2 mg Intramuscular TID PRN [name], NP       magnesium hydroxide (MILK OF MAGNESIA) suspension 30 mL  30 mL Oral Daily PRN [name], NP       nicotine (NICODERM CQ - dosed in mg/24 hours) patch 21 mg  21 mg Transdermal Daily [name], DO   21 mg at 01/01/23 0753   paliperidone (INVEGA) 24 hr tablet 3 mg  3 mg Oral Daily Nwoko, Nicole Kindred I, NP   3 mg at 01/06/23 0749   pantoprazole (PROTONIX) EC tablet 40 mg   40 mg Oral Daily [name], NP   40 mg at 01/06/23 0748   polyethylene glycol (MIRALAX / GLYCOLAX) packet 17 g  17 g Oral BID [name], DO   17 g at 01/06/23 0748   sertraline (ZOLOFT) tablet 100 mg  100 mg Oral Daily Park Pope, MD   100 mg at 01/06/23 0748   traZODone (DESYREL) tablet 50 mg  50 mg Oral QHS PRN [name], DO   50 mg at 01/05/23 2111   Lab Results:  No results found for this or any previous visit (from the past  48 hour(s)).   Blood Alcohol level:  Lab Results  Component Value Date   ETH <10 12/19/2022   ETH <10 11/21/2019   Metabolic Disorder Labs: Lab Results  Component Value Date   HGBA1C 4.4 (L) 12/21/2022   MPG 79.58 12/21/2022   No results found for: "PROLACTIN" Lab Results  Component Value Date   CHOL 218 (H) 12/21/2022   TRIG 81 12/21/2022   HDL 53 12/21/2022   CHOLHDL 4.1 12/21/2022   VLDL 16 12/21/2022   LDLCALC 149 (H) 12/21/2022   LDLCALC 110 (H) 03/13/2011   Physical Findings: AIMS:  , ,  ,  ,     Musculoskeletal: Strength & Muscle Tone: within normal limits Gait & Station: normal Patient leans: N/A  Psychiatric Specialty Exam:  Presentation  General Appearance:  Disheveled (malodorous)  Eye Contact: Good  Speech: Clear and Coherent; Normal Rate  Speech Volume: Normal  Handedness: Right  Mood and Affect  Mood: Depressed; Anxious  Affect: Appropriate; Congruent; Full Range  Thought Process  Thought Processes: Coherent; Goal Directed; Linear  Descriptions of Associations:Intact  Orientation:Full (Time, Place and Person)  Thought Content:Rumination  History of Schizophrenia/Schizoaffective disorder:Yes  Duration of Psychotic Symptoms:Greater than six months  Hallucinations:No data recorded  Ideas of Reference:None  Suicidal Thoughts:No data recorded  Homicidal Thoughts:No data recorded  Sensorium  Memory: Immediate Good  Judgment: Fair  Insight: Fair  Art therapist   Concentration: Good  Attention Span: Good  Recall: Good  Fund of Knowledge: Good  Language: Good  Psychomotor Activity  Psychomotor Activity: No data recorded  Assets  Assets: Desire for Improvement; Leisure Time  Sleep  Sleep: No data recorded  Physical Exam: Physical Exam Vitals and nursing note reviewed.  Constitutional:      General: She is not in acute distress.    Appearance: She is not ill-appearing or diaphoretic.  HENT:     Head: Normocephalic.     Nose: No congestion.  Pulmonary:     Effort: Pulmonary effort is normal. No respiratory distress.  Neurological:     General: No focal deficit present.     Mental Status: She is alert.    Blood pressure (!) 100/52, pulse (!) 117, temperature 98.1 F (36.7 C), temperature source Oral, resp. rate 14, height 4\' 11"  (1.499 m), weight 63 kg, SpO2 99 %. Body mass index is 28.05 kg/m.  Treatment Plan Summary: Daily contact with patient to assess and evaluate symptoms and progress in treatment and Medication management.   Still waiting on safe dispo as patient would not be able to live independently given cognitive impairment. Recommend assisted living.   No medication side effects.  BP normalized, likely 2/2 machine error over the past day.  Because of urinary incontinence, recommend adult diapers.  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  Plan: Re-initiated:  -Continue Buspar 15 mg po bid for anxiety.  -Continue paliperidone 3 mg po daily for mood control -Continued Sertraline 100 mg po Q daily for depression -Continue Trazodone 50 mg po Q bedtime prn for insomnia -Continue Nicoderm topically Q 24 hrs for nicotine withdrawal  management Continue vitamin B12 1000 mcg IM weekly for 4 weeks then monthly  Cont to encourage wearing diapers Lab work and EKG, reviewed with no significant abnormalities noted, exc ept slightly low b12 166.  Safety and Monitoring: Voluntary admission to inpatient psychiatric unit for safety, stabilization and treatment Daily contact with patient to assess and evaluate symptoms and progress in treatment Patient's case to be discussed in multi-disciplinary team meeting Observation Level : q15 minute checks Vital signs: q12 hours Precautions: Safety   Discharge Planning: Social work and case management to assist with discharge planning and identification of hospital follow-up needs prior to discharge Estimated LOS: 5-7 days Discharge Concerns: Need to establish a safety plan; Medication compliance and effectiveness Discharge Goals: Return home with outpatient referrals for mental health follow-up including medication management/psychotherapy No legal guardian, has payee   Sarita Bottom, MD 01/06/2023, 9:31 AM

## 2023-01-06 NOTE — Group Note (Signed)
Date:  01/06/2023 Time:  4:58 PM  Group Topic/Focus:  Goals Group:   The focus of this group is to help patients establish daily goals to achieve during treatment and discuss how the patient can incorporate goal setting into their daily lives to aide in recovery. Orientation:   The focus of this group is to educate the patient on the purpose and policies of crisis stabilization and provide a format to answer questions about their admission.  The group details unit policies and expectations of patients while admitted.    Participation Level:  Did Not Attend  Participation Quality:   n/a  Affect:   n/a  Cognitive:   n/a  Insight: None  Engagement in Group:   n/a  Modes of Intervention:   n/a  Additional Comments:   Pt did not attend.  Edmund Hilda Taylor Levick 01/06/2023, 4:58 PM

## 2023-01-07 ENCOUNTER — Ambulatory Visit (HOSPITAL_COMMUNITY): Payer: Medicaid Other | Admitting: Psychiatry

## 2023-01-07 DIAGNOSIS — F250 Schizoaffective disorder, bipolar type: Secondary | ICD-10-CM | POA: Diagnosis not present

## 2023-01-07 NOTE — Progress Notes (Signed)
   01/07/23 0900  Psych Admission Type (Psych Patients Only)  Admission Status Voluntary  Psychosocial Assessment  Patient Complaints Depression  Eye Contact Fair  Facial Expression Animated  Affect Appropriate to circumstance  Speech Logical/coherent  Interaction Childlike  Motor Activity Slow  Appearance/Hygiene Improved  Behavior Characteristics Appropriate to situation  Mood Depressed;Pleasant  Thought Process  Coherency WDL  Content WDL  Delusions None reported or observed  Perception WDL  Hallucination None reported or observed  Judgment Impaired  Confusion None  Danger to Self  Current suicidal ideation? Denies  Description of Suicide Plan No plan  Agreement Not to Harm Self Yes  Description of Agreement Verbal  Danger to Others  Danger to Others None reported or observed

## 2023-01-07 NOTE — Progress Notes (Signed)
Patient ID: Yolanda [name], female   DOB: [birth year], 47 y.o.   MRN: [id number] [facility] MD Progress Note  01/07/2023 9:53 AM Roshunda JONEA GRUWELL  MRN:  [id number]  Reason for admission: This is the first psychiatric admission in this [facility] in 12 years for this 47 AA female with an extensive hx of mental illnesses & probable polysubstance use disorders. She is admitted to the [facility] from the [facility] hospital with complain of worsening suicidal ideations with plan to stab herself. Per chart review, patient apparently reported at the ED that she has been depressed for a while & has not been taking her mental health medications. After medical evaluation.clearance, she was transferred to the [facility] for further psychiatric evaluation/treatments.  APS report on 6/24 Has payee, no legal guardian  Overnight events: No episodes of agitation or behavioral disturbances reported, no as needed medication given for agitation or aggression, as needed Atarax for anxiety and use average once daily around 9 PM, as needed trazodone for sleep given nightly. Vital signs reviewed, blood pressure stable at this time with occasional low blood pressure but patient remains asymptomatic, will follow  Upon evaluation today patient is lying down in bed awake, answers questions pleasantly, continues to report feeling "good" later reports feeling depressed patient was, I noted she is having her meals in the room, I encouraged her to go to the dining room to help with milieu participation, she agrees.  She reports good sleep and appetite reports her depressed mood is mainly related to being in the hospital and having no place to go yet, redirected patient that currently we are awaiting to be able to place her for long-term placement to be able to discharge her.  She denies passive or active SI intention or plan denies feeling hopeless or helpless.  Denies any symptoms consistent with mania or hypomania, denies HI or AVH or paranoia or other  delusions.  She denies side effect of medications and does not present sleepy, does not present with any sign consistent with TD or EPS.  ROS: 9 Systems reviewed negative   Principal Problem: Schizoaffective disorder, depressive type (HCC)   Diagnosis: Principal Problem:   Schizoaffective disorder, depressive type (HCC) Active Problems:   GERD (gastroesophageal reflux disease)   Intellectual disability   Tobacco use disorder   Past Psychiatric History: See H&P.  Past Medical History:  Past Medical History:  Diagnosis Date   Bipolar affect, depressed (HCC)    Constipation 08/17/2022   Depression    Falls 07/21/2022   Fracture of femoral neck, right, closed (HCC) 01/17/2022   Herpes simplex 08/22/2017   Open fracture dislocation of right elbow joint 01/17/2022    Past Surgical History:  Procedure Laterality Date   NO PAST SURGERIES     SALPINGECTOMY     Family History: History reviewed. No pertinent family history.  Family Psychiatric  History: See H&P.  Social History:  Social History   Substance and Sexual Activity  Alcohol Use Yes     Social History   Substance and Sexual Activity  Drug Use Yes   Types: Cocaine, Marijuana    Social History   Socioeconomic History   Marital status: Single    Spouse name: Not on file   Number of children: Not on file   Years of education: Not on file   Highest education level: Not on file  Occupational History   Not on file  Tobacco Use   Smoking status: Every Day   Smokeless tobacco:  Not on file  Substance and Sexual Activity   Alcohol use: Yes   Drug use: Yes    Types: Cocaine, Marijuana   Sexual activity: Yes  Other Topics Concern   Not on file  Social History Narrative   Not on file   Social Determinants of Health   Financial Resource Strain: Not on file  Food Insecurity: Patient Declined (12/20/2022)   Hunger Vital Sign    Worried About Running Out of Food in the Last Year: Patient declined    Ran Out of  Food in the Last Year: Patient declined  Transportation Needs: No Transportation Needs (12/20/2022)   PRAPARE - Administrator, Civil Service (Medical): No    Lack of Transportation (Non-Medical): No  Physical Activity: Not on file  Stress: Not on file  Social Connections: Not on file   Current Medications: Current Facility-Administered Medications  Medication Dose Route Frequency Provider Last Rate Last Admin   acetaminophen (TYLENOL) tablet 650 mg  650 mg Oral Q6H PRN [name], NP   650 mg at 12/29/22 1600   alum & mag hydroxide-simeth (MAALOX/MYLANTA) 200-200-20 MG/5ML suspension 30 mL  30 mL Oral Q4H PRN [name], NP       busPIRone (BUSPAR) tablet 15 mg  15 mg Oral BID [name], NP   15 mg at 01/07/23 6063   cyanocobalamin (VITAMIN B12) injection 1,000 mcg  1,000 mcg Intramuscular Weekly [name], Jilian West, MD   1,000 mcg at 01/05/23 1049   Followed by   Melene Muller ON 02/02/2023] cyanocobalamin (VITAMIN B12) injection 1,000 mcg  1,000 mcg Intramuscular Q30 days [name], Breane Grunwald, MD       diphenhydrAMINE (BENADRYL) capsule 50 mg  50 mg Oral TID PRN [name], NP       Or   diphenhydrAMINE (BENADRYL) injection 50 mg  50 mg Intramuscular TID PRN [name], NP       haloperidol (HALDOL) tablet 5 mg  5 mg Oral TID PRN [name], NP       Or   haloperidol lactate (HALDOL) injection 5 mg  5 mg Intramuscular TID PRN [name], NP       hydrOXYzine (ATARAX) tablet 25 mg  25 mg Oral TID PRN [name], DO   25 mg at 01/06/23 2102   LORazepam (ATIVAN) tablet 2 mg  2 mg Oral TID PRN [name], NP       Or   LORazepam (ATIVAN) injection 2 mg  2 mg Intramuscular TID PRN [name], NP       magnesium hydroxide (MILK OF MAGNESIA) suspension 30 mL  30 mL Oral Daily PRN [name], NP       nicotine (NICODERM CQ - dosed in mg/24 hours) patch 21 mg  21 mg Transdermal Daily [name], DO   21 mg at 01/01/23 0753   paliperidone (INVEGA) 24 hr tablet 3 mg   3 mg Oral Daily Nwoko, Nicole Kindred I, NP   3 mg at 01/07/23 0802   pantoprazole (PROTONIX) EC tablet 40 mg  40 mg Oral Daily [name], NP   40 mg at 01/07/23 0802   polyethylene glycol (MIRALAX / GLYCOLAX) packet 17 g  17 g Oral BID [name], DO   17 g at 01/06/23 0748   sertraline (ZOLOFT) tablet 100 mg  100 mg Oral Daily Park Pope, MD   100 mg at 01/07/23 0802   traZODone (DESYREL) tablet 50 mg  50 mg Oral QHS  PRN [name], DO   50 mg at 01/06/23 2102   Lab Results:  No results found for this or any previous visit (from the past 48 hour(s)).   Blood Alcohol level:  Lab Results  Component Value Date   ETH <10 12/19/2022   ETH <10 11/21/2019   Metabolic Disorder Labs: Lab Results  Component Value Date   HGBA1C 4.4 (L) 12/21/2022   MPG 79.58 12/21/2022   No results found for: "PROLACTIN" Lab Results  Component Value Date   CHOL 218 (H) 12/21/2022   TRIG 81 12/21/2022   HDL 53 12/21/2022   CHOLHDL 4.1 12/21/2022   VLDL 16 12/21/2022   LDLCALC 149 (H) 12/21/2022   LDLCALC 110 (H) 03/13/2011   Physical Findings: AIMS: Facial and Oral Movements Muscles of Facial Expression: None, normal Lips and Perioral Area: None, normal Jaw: None, normal Tongue: None, normal,Extremity Movements Upper (arms, wrists, hands, fingers): None, normal Lower (legs, knees, ankles, toes): None, normal, Trunk Movements Neck, shoulders, hips: None, normal, Overall Severity Severity of abnormal movements (highest score from questions above): None, normal Incapacitation due to abnormal movements: None, normal Patient's awareness of abnormal movements (rate only patient's report): No Awareness, Dental Status Current problems with teeth and/or dentures?: No Does patient usually wear dentures?: No   Musculoskeletal: Strength & Muscle Tone: within normal limits Gait & Station: normal Patient leans: N/A  Psychiatric Specialty Exam:  Presentation  General Appearance:  Lying down in bed,  better hygiene noted  Eye Contact: Fair Speech: Clear and Coherent; Normal Rate  Speech Volume: Normal  Handedness: Right  Mood and Affect  Mood: Depressed; Anxious  Affect: Appropriate; Congruent; Full Range  Thought Process  Thought Processes: Coherent; Goal Directed; Linear  Descriptions of Associations:Intact  Orientation:Full (Time, Place and Person)  Thought Content: Denies SI HI or AVH does not present responding to stimuli. Presentation consistent with moderate intellectual disability History of Schizophrenia/Schizoaffective disorder:Yes  Duration of Psychotic Symptoms:Greater than six months  Hallucinations:No data recorded  Ideas of Reference:None  Suicidal Thoughts:No data recorded  Homicidal Thoughts:No data recorded  Sensorium  Memory: Immediate Good  Judgment: Limited Insight: Limited Executive Functions  Concentration: Good  Attention Span: Good  Recall: Good  Fund of Knowledge: Good  Language: Good  Psychomotor Activity  Psychomotor Activity: No data recorded  Assets  Assets: Desire for Improvement; Leisure Time  Sleep  Sleep: No data recorded  Physical Exam: Physical Exam Vitals and nursing note reviewed.  Constitutional:      General: She is not in acute distress.    Appearance: She is not ill-appearing or diaphoretic.  HENT:     Head: Normocephalic.     Nose: No congestion.  Pulmonary:     Effort: Pulmonary effort is normal. No respiratory distress.  Neurological:     General: No focal deficit present.     Mental Status: She is alert.    Blood pressure 91/64, pulse 78, temperature 98.2 F (36.8 C), temperature source Oral, resp. rate 20, height 4\' 11"  (1.499 m), weight 63 kg, SpO2 100 %. Body mass index is 28.05 kg/m.  Treatment Plan Summary: Daily contact with patient to assess and evaluate symptoms and progress in treatment and Medication management.   Still waiting on safe dispo as patient would  not be able to live independently given cognitive impairment. Recommend assisted living.   No medication side effects.  BP normalized, likely 2/2 machine error over the past day.  Because of urinary incontinence, recommend adult diapers.  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  Plan: Re-initiated:  -Continue Buspar 15 mg po bid for anxiety.  -Continue paliperidone 3 mg po daily for mood control -Continued Sertraline 100 mg po Q daily for depression -Continue Trazodone 50 mg po Q bedtime prn for insomnia -Continue Nicoderm topically Q 24 hrs for nicotine withdrawal management Continue vitamin B12 1000 mcg IM weekly for 4 weeks then monthly  Cont to encourage wearing diapers Lab work and EKG, reviewed with no significant abnormalities noted, exc ept slightly low b12 166.  Safety and Monitoring: Voluntary admission to inpatient psychiatric unit for safety, stabilization and treatment Daily contact with patient to assess and evaluate symptoms and progress in treatment Patient's case to be discussed in multi-disciplinary team meeting Observation Level : q15 minute checks Vital signs: q12 hours Precautions: Safety   Discharge Planning: Social work and case management to assist with discharge planning and identification of hospital follow-up needs prior to discharge Estimated LOS: 5-7 days Discharge Concerns: Need to establish a safety plan; Medication compliance and effectiveness Discharge Goals: Return home with outpatient referrals for mental health follow-up including medication management/psychotherapy No legal guardian, has payee   Sarita Bottom, MD 01/07/2023, 9:53 AM

## 2023-01-07 NOTE — Group Note (Signed)
Recreation Therapy Group Note   Group Topic:Problem Solving  Group Date: 01/07/2023 Start Time: 0935 End Time: 1010 Facilitators: Rodarius Kichline-McCall, LRT,CTRS Location: [address] Day[address]   Goal Area(s) Addresses:  Patient will effectively work with peer towards shared goal.  Patient will identify skills used to make activity successful.  Patient will identify how skills used during activity can be used to reach post d/c goals.   Group Description: Landing Pad. In teams of 3-5, patients were given 12 plastic drinking straws and an equal length of masking tape. Using the materials provided, patients were asked to build a landing pad to catch a golf ball dropped from approximately 5 feet in the air. All materials were required to be used by the team in their design. LRT facilitated post-activity discussion.   Affect/Mood: N/A   Participation Level: Did not attend    Clinical Observations/Individualized Feedback:      Plan: Continue to engage patient in RT group sessions 2-3x/week.   Yolanda [name], LRT,CTRS  01/07/2023 11:47 AM

## 2023-01-07 NOTE — Group Note (Deleted)
Recreation Therapy Group Note   Group Topic:Problem Solving  Group Date: 01/07/2023 Start Time: 0935 End Time: 1010 Facilitators: Sumire Halbleib-McCall, Thaison Kolodziejski A, NT Location: [address] Dayroom       Affect/Mood: {RT BHH Affect/Mood:26271}   Participation Level: {RT BHH Participation Level:26267}   Participation Quality: {RT BHH Participation Quality:26268}   Behavior: {RT BHH Group Behavior:26269}   Speech/Thought Process: {RT BHH Speech/Thought:26276}   Insight: {RT BHH Insight:26272}   Judgement: {RT BHH Judgement:26278}   Modes of Intervention: {RT BHH Modes of Intervention:26277}   Patient Response to Interventions:  {RT BHH Patient Response to Intervention:26274}   Education Outcome:  {RT BHH Education Outcome:26279}   Clinical Observations/Individualized Feedback: *** was *** in their participation of session activities and group discussion. Pt identified ***   Plan: {RT BHH Tx Plan:26280}   Maygan Koeller A Keltin Baird-McCall, NT,  01/07/2023 12:22 PM 

## 2023-01-07 NOTE — BHH Group Notes (Signed)
Adult Psychoeducational Group Note  Date:  01/07/2023 Time:  10:05 PM  Group Topic/Focus:  Wrap-Up Group:   The focus of this group is to help patients review their daily goal of treatment and discuss progress on daily workbooks.  Participation Level:  Did Not Attend   Yolanda [name] 01/07/2023, 10:05 PM

## 2023-01-08 DIAGNOSIS — F251 Schizoaffective disorder, depressive type: Secondary | ICD-10-CM | POA: Diagnosis not present

## 2023-01-08 NOTE — Progress Notes (Addendum)
D. Pt presented bright, smiling, at the med window upon initial approach this am. Pt later observed with a sullen affect, laying in bed with her bible laid open by her head. . Pt explained  that she plans to stay in her room today because she overheard another pt calling her 'retarded'.  Pt became tearful at times during our conversation, but mood quickly improved when talking about her grandson. Per pt's self inventory, pt rated her depression,hopelessness and anxiety all 10/10. Pt currently denies SI/HI and AVH and does not appear to be responding to internal stimuli A. Labs and vitals monitored. Pt given and educated on medications. Pt supported emotionally and encouraged to express concerns and ask questions.   R. Pt remains safe with 15 minute checks. Will continue POC.    01/08/23 1100  Psych Admission Type (Psych Patients Only)  Admission Status Voluntary  Psychosocial Assessment  Patient Complaints Depression;Hopelessness;Crying spells  Eye Contact Brief  Facial Expression Sad  Affect Appropriate to circumstance  Speech Slow  Interaction Childlike  Motor Activity Slow  Appearance/Hygiene In hospital gown  Behavior Characteristics Cooperative;Calm  Mood Depressed;Preoccupied  Thought Process  Coherency WDL  Content WDL  Delusions None reported or observed  Perception WDL  Hallucination None reported or observed  Judgment Impaired  Confusion Mild  Danger to Self  Current suicidal ideation? Denies  Self-Injurious Behavior No self-injurious ideation or behavior indicators observed or expressed   Agreement Not to Harm Self Yes

## 2023-01-08 NOTE — Progress Notes (Signed)
Patient ID: ARMIYA CARIAS, female   DOB: 08-23-1975, 47 y.o.   MRN: [id number] [facility] MD Progress Note  01/08/2023 1:02 PM Naidelyn VALETTA KINGSLAND  MRN:  [id number]  Reason for admission: This is the first psychiatric admission in this [facility] in 12 years for this 42 AA female with an extensive hx of mental illnesses & probable polysubstance use disorders. She is admitted to the [facility] from the [facility] hospital with complain of worsening suicidal ideations with plan to stab herself. Per chart review, patient apparently reported at the ED that she has been depressed for a while & has not been taking her mental health medications. After medical evaluation.clearance, she was transferred to the [facility] for further psychiatric evaluation/treatments.  APS report on 6/24 Has payee, no legal guardian  Overnight events:  Staff reports that overnight she had 1 episode when she had a "accident" in bed and had to have clean linen on her bed.  However she has not had any episodes of acute agitation or behavioral disturbance.  No additional as needed medications were given.  She remains bright and cheerful. And apparently reported to the nurse this morning that she preferred to stay in her room because she removed her the patient calling her" retarded"  On evaluation today the patient is lying in bed and doing her drawing.  She is also reading the Bible.  She remained alert oriented and cooperative and pleasant.  She reports that she is not feeling good in general.  She denies any active SI/HI/AVH.  She acknowledges that she is waiting for placement.  She denies any side effects of medications.  She denied any oversedation and received only as needed trazodone and hydroxyzine. The plan is to continue to explore safe placement. ROS: 9 Systems reviewed negative   Principal Problem: Schizoaffective disorder, depressive type (HCC)   Diagnosis: Principal Problem:   Schizoaffective disorder, depressive type (HCC) Active  Problems:   GERD (gastroesophageal reflux disease)   Intellectual disability   Tobacco use disorder   Past Psychiatric History: See H&P.  Past Medical History:  Past Medical History:  Diagnosis Date   Bipolar affect, depressed (HCC)    Constipation 08/17/2022   Depression    Falls 07/21/2022   Fracture of femoral neck, right, closed (HCC) 01/17/2022   Herpes simplex 08/22/2017   Open fracture dislocation of right elbow joint 01/17/2022    Past Surgical History:  Procedure Laterality Date   NO PAST SURGERIES     SALPINGECTOMY     Family History: History reviewed. No pertinent family history.  Family Psychiatric  History: See H&P.  Social History:  Social History   Substance and Sexual Activity  Alcohol Use Yes     Social History   Substance and Sexual Activity  Drug Use Yes   Types: Cocaine, Marijuana    Social History   Socioeconomic History   Marital status: Single    Spouse name: Not on file   Number of children: Not on file   Years of education: Not on file   Highest education level: Not on file  Occupational History   Not on file  Tobacco Use   Smoking status: Every Day   Smokeless tobacco: Not on file  Substance and Sexual Activity   Alcohol use: Yes   Drug use: Yes    Types: Cocaine, Marijuana   Sexual activity: Yes  Other Topics Concern   Not on file  Social History Narrative   Not on file  Social Determinants of Health   Financial Resource Strain: Not on file  Food Insecurity: Patient Declined (12/20/2022)   Hunger Vital Sign    Worried About Running Out of Food in the Last Year: Patient declined    Ran Out of Food in the Last Year: Patient declined  Transportation Needs: No Transportation Needs (12/20/2022)   PRAPARE - Administrator, Civil Service (Medical): No    Lack of Transportation (Non-Medical): No  Physical Activity: Not on file  Stress: Not on file  Social Connections: Not on file   Current Medications: Current  Facility-Administered Medications  Medication Dose Route Frequency Provider Last Rate Last Admin   acetaminophen (TYLENOL) tablet 650 mg  650 mg Oral Q6H PRN [name], NP   650 mg at 12/29/22 1600   alum & mag hydroxide-simeth (MAALOX/MYLANTA) 200-200-20 MG/5ML suspension 30 mL  30 mL Oral Q4H PRN [name], NP       busPIRone (BUSPAR) tablet 15 mg  15 mg Oral BID [name], NP   15 mg at 01/08/23 1610   cyanocobalamin (VITAMIN B12) injection 1,000 mcg  1,000 mcg Intramuscular Weekly [name], Nadir, MD   1,000 mcg at 01/05/23 1049   Followed by   Melene Muller ON 02/02/2023] cyanocobalamin (VITAMIN B12) injection 1,000 mcg  1,000 mcg Intramuscular Q30 days [name], Nadir, MD       diphenhydrAMINE (BENADRYL) capsule 50 mg  50 mg Oral TID PRN [name], NP       Or   diphenhydrAMINE (BENADRYL) injection 50 mg  50 mg Intramuscular TID PRN [name], NP       haloperidol (HALDOL) tablet 5 mg  5 mg Oral TID PRN [name], NP       Or   haloperidol lactate (HALDOL) injection 5 mg  5 mg Intramuscular TID PRN [name], NP       hydrOXYzine (ATARAX) tablet 25 mg  25 mg Oral TID PRN [name], DO   25 mg at 01/06/23 2102   LORazepam (ATIVAN) tablet 2 mg  2 mg Oral TID PRN [name], NP       Or   LORazepam (ATIVAN) injection 2 mg  2 mg Intramuscular TID PRN [name], NP       magnesium hydroxide (MILK OF MAGNESIA) suspension 30 mL  30 mL Oral Daily PRN [name], NP       nicotine (NICODERM CQ - dosed in mg/24 hours) patch 21 mg  21 mg Transdermal Daily [name], DO   21 mg at 01/01/23 0753   paliperidone (INVEGA) 24 hr tablet 3 mg  3 mg Oral Daily [name], NP   3 mg at 01/08/23 0755   pantoprazole (PROTONIX) EC tablet 40 mg  40 mg Oral Daily [name], NP   40 mg at 01/08/23 0755   polyethylene glycol (MIRALAX / GLYCOLAX) packet 17 g  17 g Oral BID [name], DO   17 g at 01/08/23 0755   sertraline (ZOLOFT) tablet 100 mg  100 mg Oral Daily  Park Pope, MD   100 mg at 01/08/23 0755   traZODone (DESYREL) tablet 50 mg  50 mg Oral QHS PRN [name], DO   50 mg at 01/06/23 2102   Lab Results:  No results found for this or any previous visit (from the past 48 hour(s)).   Blood Alcohol level:  Lab Results  Component Value Date   ETH <10 12/19/2022  ETH <10 11/21/2019   Metabolic Disorder Labs: Lab Results  Component Value Date   HGBA1C 4.4 (L) 12/21/2022   MPG 79.58 12/21/2022   No results found for: "PROLACTIN" Lab Results  Component Value Date   CHOL 218 (H) 12/21/2022   TRIG 81 12/21/2022   HDL 53 12/21/2022   CHOLHDL 4.1 12/21/2022   VLDL 16 12/21/2022   LDLCALC 149 (H) 12/21/2022   LDLCALC 110 (H) 03/13/2011   Physical Findings: AIMS: Facial and Oral Movements Muscles of Facial Expression: None, normal Lips and Perioral Area: None, normal Jaw: None, normal Tongue: None, normal,Extremity Movements Upper (arms, wrists, hands, fingers): None, normal Lower (legs, knees, ankles, toes): None, normal, Trunk Movements Neck, shoulders, hips: None, normal, Overall Severity Severity of abnormal movements (highest score from questions above): None, normal Incapacitation due to abnormal movements: None, normal Patient's awareness of abnormal movements (rate only patient's report): No Awareness, Dental Status Current problems with teeth and/or dentures?: No Does patient usually wear dentures?: No   Musculoskeletal: Strength & Muscle Tone: within normal limits Gait & Station: normal Patient leans: N/A  Psychiatric Specialty Exam:  Presentation  General Appearance:  Lying down in bed, better hygiene noted  Eye Contact: Fair Speech: Clear and Coherent  Speech Volume: Decreased  Handedness: Right  Mood and Affect  Mood: Anxious  Affect: Constricted  Thought Process  Thought Processes: Coherent  Descriptions of Associations:Intact  Orientation:Full (Time, Place and Person)  Thought  Content: Denies SI HI or AVH does not present responding to stimuli. Presentation consistent with moderate intellectual disability History of Schizophrenia/Schizoaffective disorder:Yes  Duration of Psychotic Symptoms:Greater than six months  Hallucinations:Hallucinations: None   Ideas of Reference:None  Suicidal Thoughts:Suicidal Thoughts: No   Homicidal Thoughts:Homicidal Thoughts: No   Sensorium  Memory: Immediate Fair; Remote Fair; Recent Fair  Judgment: Limited Insight: Limited Executive Functions  Concentration: Fair  Attention Span: Fair  Recall: [employer] of Knowledge: Fair  Language: Fair  Psychomotor Activity  Psychomotor Activity: Psychomotor Activity: Normal   Assets  Assets: Leisure Time; Resilience  Sleep  Sleep: Sleep: Fair   Physical Exam: Physical Exam Vitals and nursing note reviewed.  Constitutional:      General: She is not in acute distress.    Appearance: She is not ill-appearing or diaphoretic.  HENT:     Head: Normocephalic.     Nose: No congestion.  Pulmonary:     Effort: Pulmonary effort is normal. No respiratory distress.  Neurological:     General: No focal deficit present.     Mental Status: She is alert.    Blood pressure 92/67, pulse 78, temperature 98.2 F (36.8 C), temperature source Oral, resp. rate 20, height 4\' 11"  (1.499 m), weight 63 kg, SpO2 100 %. Body mass index is 28.05 kg/m.  Treatment Plan Summary: Daily contact with patient to assess and evaluate symptoms and progress in treatment and Medication management.   Still waiting on safe dispo as patient would not be able to live independently given cognitive impairment. Recommend assisted living.   No medication side effects.  BP normalized, likely 2/2 machine error over the past day.  Because of urinary incontinence, recommend adult diapers.  Principal/active diagnoses:  Principal Problem:   Schizoaffective disorder, depressive type  (HCC) Active Problems:   GERD (gastroesophageal reflux disease)   Intellectual disability   Tobacco use disorder (MOCA 16/30) moderate cognitive impairment probably related to moderate intellectual disability.  Plan: Re-initiated:  -Continue Buspar 15 mg po bid for anxiety.  -  Continue paliperidone 3 mg po daily for mood control -Continued Sertraline 100 mg po Q daily for depression -Continue Trazodone 50 mg po Q bedtime prn for insomnia -Continue Nicoderm topically Q 24 hrs for nicotine withdrawal management Continue vitamin B12 1000 mcg IM weekly for 4 weeks then monthly  Cont to encourage wearing diapers Lab work and EKG, reviewed with no significant abnormalities noted, exc ept slightly low b12 166.  Safety and Monitoring: Voluntary admission to inpatient psychiatric unit for safety, stabilization and treatment Daily contact with patient to assess and evaluate symptoms and progress in treatment Patient's case to be discussed in multi-disciplinary team meeting Observation Level : q15 minute checks Vital signs: q12 hours Precautions: Safety   Discharge Planning: Social work and case management to assist with discharge planning and identification of hospital follow-up needs prior to discharge Estimated LOS: Unknown at this time. Discharge Concerns: Need to establish a safety plan; Medication compliance and effectiveness Discharge Goals: Return home with outpatient referrals for mental health follow-up including medication management/psychotherapy No legal guardian, has payee   [name], MD 01/08/2023, 1:02 PMPatient ID: Larey Brick, female   DOB: 1975-10-30, 47 y.o.   MRN: [id number]

## 2023-01-08 NOTE — [facility] Group Notes (Addendum)
Spiritual care group on grief and loss facilitated by chaplain Katy Mennie Spiller, BCC   Group Goal:   Support / Education around grief and loss   Members engage in facilitated group support and psycho-social education.   Group Description:   Following introductions and group rules, group members engaged in facilitated group dialog and support around topic of loss, with particular support around experiences of loss in their lives. Group Identified types of loss (relationships / self / things) and identified patterns, circumstances, and changes that precipitate losses. Reflected on thoughts / feelings around loss, normalized grief responses, and recognized variety in grief experience. Group noted Worden's four tasks of grief in discussion.   Group drew on Adlerian / Rogerian, narrative, MI,   Patient Progress: Did not attend.  

## 2023-01-08 NOTE — Progress Notes (Signed)
Adult Psychoeducational Group Note  Date:  01/08/2023 Time:  1:27 PM  Group Topic/Focus:  Goals Group:   The focus of this group is to help patients establish daily goals to achieve during treatment and discuss how the patient can incorporate goal setting into their daily lives to aide in recovery. Orientation:   The focus of this group is to educate the patient on the purpose and policies of crisis stabilization and provide a format to answer questions about their admission.  The group details unit policies and expectations of patients while admitted.  Participation Level:  Did Not Attend  Participation Quality:    Affect:    Cognitive:    Insight:   Engagement in Group:    Modes of Intervention:    Additional Comments:  Pt did not attend group  [employer] 01/08/2023, 1:27 PM

## 2023-01-08 NOTE — Group Note (Signed)
Recreation Therapy Group Note   Group Topic:Animal Assisted Therapy   Group Date: 01/08/2023 Start Time: 0950 End Time: 1030 Facilitators: Seri Kimmer-McCall, LRT,CTRS Location: [address] Dayroom   Animal-Assisted Activity (AAA) Program Checklist/Progress Notes Patient Eligibility Criteria Checklist & Daily Group note for Rec Tx Intervention  AAA/T Program Assumption of Risk Form signed by Patient/ or Parent Legal Guardian Yes  Patient understands his/her participation is voluntary Yes   Affect/Mood: N/A   Participation Level: Did not attend    Clinical Observations/Individualized Feedback:     Plan: Continue to engage patient in RT group sessions 2-3x/week.   Vyolet Sakuma-McCall, LRT,CTRS 01/08/2023 12:19 PM

## 2023-01-08 NOTE — Progress Notes (Signed)
Adult Psychoeducational Group Note  Date:  01/08/2023 Time:  9:34 PM  Group Topic/Focus:  Wrap-Up Group:   The focus of this group is to help patients review their daily goal of treatment and discuss progress on daily workbooks.  Participation Level:  Did not attend group.  Participation Quality:  Appropriate  Affect:  Appropriate  Cognitive:  Appropriate  Insight: Appropriate  Engagement in Group:  Did not attend group.  Modes of Intervention:  Discussion  Additional Comments: Pt. Did not attend group.  Joselyn Arrow 01/08/2023, 9:34 PM

## 2023-01-08 NOTE — Group Note (Signed)
Date:  01/08/2023 Time:  12:15 PM  Group Topic/Focus:  Managing Feelings:   The focus of this group is to identify what feelings patients have difficulty handling and develop a plan to handle them in a healthier way upon discharge.    Participation Level:  Did Not Attend  Participation Quality:    Affect:    Cognitive:    Insight:   Engagement in Group:    Modes of Intervention:    Additional Comments:    Memory Dance Yolanda [name] 01/08/2023, 12:15 PM

## 2023-01-08 NOTE — Progress Notes (Signed)
D: Pt alert and oriented.  Pt denies experiencing any SI/HI, or AVH at this time.   A: Scheduled medications administered to pt, per MD orders. Support and encouragement provided. Frequent verbal contact made. Routine safety checks conducted q15 minutes.   R: No adverse drug reactions noted. Pt verbally contracts for safety at this time. Pt complaint with medications and treatment plan. Patient did not participate . Patient had urinary incontinence around 3 am. Encouraged the pt to clean herself with wash clothes.Changed linen and gave her new gowns. Now she is on disposable incontinence adult pad. Pt remains safe at this time. Will continue to monitor.  01/07/23 2300  Psych Admission Type (Psych Patients Only)  Admission Status Voluntary  Psychosocial Assessment  Patient Complaints Crying spells  Eye Contact Brief  Facial Expression Grimacing  Affect Appropriate to circumstance  Speech Slow  Interaction Childlike  Motor Activity Slow  Appearance/Hygiene In hospital gown  Behavior Characteristics Cooperative  Mood Guilty;Depressed  Thought Process  Coherency WDL  Content UTA  Delusions None reported or observed  Perception WDL  Hallucination None reported or observed  Judgment Impaired  Confusion Mild  Danger to Self  Current suicidal ideation?  (denies)  Self-Injurious Behavior No self-injurious ideation or behavior indicators observed or expressed   Agreement Not to Harm Self Yes  Description of Agreement verbal  Danger to Others  Danger to Others None reported or observed

## 2023-01-08 NOTE — Progress Notes (Signed)
Around 0255 pt. Report to MHT that she had had an accident and needed some clean linen.  This writer helped this pt. by cleaning and putting clean linen on the bed.  Pt. then had to be instructed to change out of wet clothes and get herself cleaned up and was given 2 hospital gowns to put on.  Pt. clothes were put in the washing machine to be washed.  This pt. probably would benefit more if she was on the [address] hall.

## 2023-01-09 ENCOUNTER — Encounter (HOSPITAL_COMMUNITY): Payer: Self-pay

## 2023-01-09 DIAGNOSIS — F251 Schizoaffective disorder, depressive type: Secondary | ICD-10-CM | POA: Diagnosis not present

## 2023-01-09 LAB — URINALYSIS, ROUTINE W REFLEX MICROSCOPIC
Bilirubin Urine: NEGATIVE
Glucose, UA: NEGATIVE mg/dL
Hgb urine dipstick: NEGATIVE
Ketones, ur: NEGATIVE mg/dL
Nitrite: POSITIVE — AB
Protein, ur: NEGATIVE mg/dL
Specific Gravity, Urine: 1.017 (ref 1.005–1.030)
pH: 6 (ref 5.0–8.0)

## 2023-01-09 NOTE — Progress Notes (Signed)
   01/09/23 0822  Psych Admission Type (Psych Patients Only)  Admission Status Voluntary  Psychosocial Assessment  Patient Complaints None  Eye Contact Fair  Facial Expression Animated  Affect Appropriate to circumstance  Speech Logical/coherent  Interaction Assertive  Motor Activity Slow  Appearance/Hygiene Body odor;Disheveled  Behavior Characteristics Cooperative  Mood Pleasant  Thought Process  Coherency WDL  Content WDL  Delusions None reported or observed  Perception WDL  Hallucination None reported or observed  Judgment Poor  Confusion None  Danger to Self  Current suicidal ideation? Denies  Self-Injurious Behavior No self-injurious ideation or behavior indicators observed or expressed   Agreement Not to Harm Self Yes  Description of Agreement verbal  Danger to Others  Danger to Others None reported or observed

## 2023-01-09 NOTE — [facility] Group Notes (Signed)
Spiritual care group facilitated by Chaplain Katy Sarra Rachels, BCC  Group focused on topic of strength. Group members reflected on what thoughts and feelings emerge when they hear this topic. They then engaged in facilitated dialog around how strength is present in their lives. This dialog focused on representing what strength had been to them in their lives (images and patterns given) and what they saw as helpful in their life now (what they needed / wanted).  Activity drew on narrative framework.  Patient Progress: Did not attend.  

## 2023-01-09 NOTE — Group Note (Signed)
Date:  01/09/2023 Time:  7:04 PM  Group Topic/Focus:  Social Wellness ( Self and Interpersonal)    Participation Level:  Did Not Attend  Participation Quality:   n/a  Affect:   n/a  Cognitive:   n/a  Insight: None  Engagement in Group:   n/a  Modes of Intervention:   n/a  Additional Comments:   Pt did not attend  Yolanda [name] 01/09/2023, 7:04 PM

## 2023-01-09 NOTE — [facility] Group Notes (Signed)
[facility] Group Notes:  (Nursing/MHT/Case Management/Adjunct)  Date:  01/09/2023  Time:  8:14 PM  Type of Therapy:   NA group  Participation Level:  Did Not Attend  Participation Quality:    Affect:    Cognitive:    Insight:    Engagement in Group:    Modes of Intervention:    Summary of Progress/Problems: Didn't attend.   Noah Delaine 01/09/2023, 8:14 PM

## 2023-01-09 NOTE — BH IP Treatment Plan (Signed)
Interdisciplinary Treatment and Diagnostic Plan Update  01/09/2023 Time of Session: update  Yolanda [name] MRN: [id number]  Principal Diagnosis: Schizoaffective disorder, depressive type (HCC)  Secondary Diagnoses: Principal Problem:   Schizoaffective disorder, depressive type (HCC) Active Problems:   GERD (gastroesophageal reflux disease)   Intellectual disability   Tobacco use disorder   Current Medications:  Current Facility-Administered Medications  Medication Dose Route Frequency Provider Last Rate Last Admin   acetaminophen (TYLENOL) tablet 650 mg  650 mg Oral Q6H PRN [name], NP   650 mg at 12/29/22 1600   alum & mag hydroxide-simeth (MAALOX/MYLANTA) 200-200-20 MG/5ML suspension 30 mL  30 mL Oral Q4H PRN [name], NP       busPIRone (BUSPAR) tablet 15 mg  15 mg Oral BID [name], NP   15 mg at 01/09/23 4098   cyanocobalamin (VITAMIN B12) injection 1,000 mcg  1,000 mcg Intramuscular Weekly [name], Nadir, MD   1,000 mcg at 01/05/23 1049   Followed by   Melene Muller ON 02/02/2023] cyanocobalamin (VITAMIN B12) injection 1,000 mcg  1,000 mcg Intramuscular Q30 days [name], Nadir, MD       diphenhydrAMINE (BENADRYL) capsule 50 mg  50 mg Oral TID PRN [name], NP       Or   diphenhydrAMINE (BENADRYL) injection 50 mg  50 mg Intramuscular TID PRN [name], NP       haloperidol (HALDOL) tablet 5 mg  5 mg Oral TID PRN [name], NP       Or   haloperidol lactate (HALDOL) injection 5 mg  5 mg Intramuscular TID PRN [name], NP       hydrOXYzine (ATARAX) tablet 25 mg  25 mg Oral TID PRN [name], DO   25 mg at 01/08/23 2032   LORazepam (ATIVAN) tablet 2 mg  2 mg Oral TID PRN [name], NP       Or   LORazepam (ATIVAN) injection 2 mg  2 mg Intramuscular TID PRN [name], NP       magnesium hydroxide (MILK OF MAGNESIA) suspension 30 mL  30 mL Oral Daily PRN [name], NP       nicotine (NICODERM CQ - dosed in mg/24 hours) patch 21 mg  21 mg  Transdermal Daily [name], DO   21 mg at 01/09/23 0818   paliperidone (INVEGA) 24 hr tablet 3 mg  3 mg Oral Daily Nwoko, Nicole Kindred I, NP   3 mg at 01/09/23 0821   pantoprazole (PROTONIX) EC tablet 40 mg  40 mg Oral Daily [name], NP   40 mg at 01/09/23 0820   polyethylene glycol (MIRALAX / GLYCOLAX) packet 17 g  17 g Oral BID [name], DO   17 g at 01/08/23 2032   sertraline (ZOLOFT) tablet 100 mg  100 mg Oral Daily Park Pope, MD   100 mg at 01/09/23 0820   traZODone (DESYREL) tablet 50 mg  50 mg Oral QHS PRN [name], DO   50 mg at 01/08/23 2032   PTA Medications: Medications Prior to Admission  Medication Sig Dispense Refill Last Dose   busPIRone (BUSPAR) 15 MG tablet Take 15 mg by mouth 2 (two) times daily. (Patient not taking: Reported on 12/19/2022)      paliperidone (INVEGA SUSTENNA) 156 MG/ML SUSY injection Inject 156 mg into the muscle once. (Patient not taking: Reported on 12/19/2022)      sertraline (ZOLOFT) 50 MG tablet Take 150 mg by mouth daily. (Patient  not taking: Reported on 12/19/2022)      traZODone (DESYREL) 100 MG tablet Take 100 mg by mouth at bedtime as needed for sleep. (Patient not taking: Reported on 11/12/2022)       Patient Stressors: Medication change or noncompliance    Patient Strengths: Forensic psychologist fund of knowledge   Treatment Modalities: Medication Management, Group therapy, Case management,  1 to 1 session with clinician, Psychoeducation, Recreational therapy.   Physician Treatment Plan for Primary Diagnosis: Schizoaffective disorder, depressive type (HCC) Long Term Goal(s): Improvement in symptoms so as ready for discharge   Short Term Goals: Ability to identify and develop effective coping behaviors will improve Ability to maintain clinical measurements within normal limits will improve Compliance with prescribed medications will improve Ability to identify triggers associated with substance abuse/mental health issues  will improve Ability to identify changes in lifestyle to reduce recurrence of condition will improve Ability to verbalize feelings will improve Ability to disclose and discuss suicidal ideas Ability to demonstrate self-control will improve  Medication Management: Evaluate patient's response, side effects, and tolerance of medication regimen.  Therapeutic Interventions: 1 to 1 sessions, Unit Group sessions and Medication administration.  Evaluation of Outcomes: Progressing  Physician Treatment Plan for Secondary Diagnosis: Principal Problem:   Schizoaffective disorder, depressive type (HCC) Active Problems:   GERD (gastroesophageal reflux disease)   Intellectual disability   Tobacco use disorder  Long Term Goal(s): Improvement in symptoms so as ready for discharge   Short Term Goals: Ability to identify and develop effective coping behaviors will improve Ability to maintain clinical measurements within normal limits will improve Compliance with prescribed medications will improve Ability to identify triggers associated with substance abuse/mental health issues will improve Ability to identify changes in lifestyle to reduce recurrence of condition will improve Ability to verbalize feelings will improve Ability to disclose and discuss suicidal ideas Ability to demonstrate self-control will improve     Medication Management: Evaluate patient's response, side effects, and tolerance of medication regimen.  Therapeutic Interventions: 1 to 1 sessions, Unit Group sessions and Medication administration.  Evaluation of Outcomes: Progressing   RN Treatment Plan for Primary Diagnosis: Schizoaffective disorder, depressive type (HCC) Long Term Goal(s): Knowledge of disease and therapeutic regimen to maintain health will improve  Short Term Goals: Ability to remain free from injury will improve, Ability to verbalize frustration and anger appropriately will improve, Ability to demonstrate  self-control, Ability to participate in decision making will improve, Ability to verbalize feelings will improve, Ability to disclose and discuss suicidal ideas, Ability to identify and develop effective coping behaviors will improve, and Compliance with prescribed medications will improve  Medication Management: RN will administer medications as ordered by provider, will assess and evaluate patient's response and provide education to patient for prescribed medication. RN will report any adverse and/or side effects to prescribing provider.  Therapeutic Interventions: 1 on 1 counseling sessions, Psychoeducation, Medication administration, Evaluate responses to treatment, Monitor vital signs and CBGs as ordered, Perform/monitor CIWA, COWS, AIMS and Fall Risk screenings as ordered, Perform wound care treatments as ordered.  Evaluation of Outcomes: Met   LCSW Treatment Plan for Primary Diagnosis: Schizoaffective disorder, depressive type (HCC) Long Term Goal(s): Safe transition to appropriate next level of care at discharge, Engage patient in therapeutic group addressing interpersonal concerns.  Short Term Goals: Engage patient in aftercare planning with referrals and resources, Increase social support, Increase ability to appropriately verbalize feelings, Increase emotional regulation, Facilitate acceptance of mental health diagnosis and concerns, Facilitate patient  progression through stages of change regarding substance use diagnoses and concerns, Identify triggers associated with mental health/substance abuse issues, and Increase skills for wellness and recovery  Therapeutic Interventions: Assess for all discharge needs, 1 to 1 time with Social worker, Explore available resources and support systems, Assess for adequacy in community support network, Educate family and significant other(s) on suicide prevention, Complete Psychosocial Assessment, Interpersonal group therapy.  Evaluation of Outcomes:  Progressing   Progress in Treatment: Attending groups: Yes. Participating in groups: Yes. Taking medication as prescribed: Yes. Toleration medication: Yes. Family/Significant other contact made: Yes, individual(s) contacted:  mother Patient understands diagnosis: Yes. Discussing patient identified problems/goals with staff: Yes. Medical problems stabilized or resolved: Yes. Denies suicidal/homicidal ideation: Yes. Issues/concerns per patient self-inventory: No.   New problem(s) identified: No, Describe:  none reported   New Short Term/Long Term Goal(s):   medication stabilization, elimination of SI thoughts, development of comprehensive mental wellness plan.    Patient Goals: Pt will continue to work on initial tx team goals    Discharge Plan or Barriers:Placement.  APS involvement.    Reason for Continuation of Hospitalization: Anxiety Depression Medication stabilization Suicidal ideation  Estimated Length of Stay: TBD  Last 3 [place] Suicide Severity Risk Score: Flowsheet Row Ad[facility] (Current) [facility] from 12/19/2022 in [facility] Department at [facility] ED from 11/12/2022 in [facility] Emergency Department at [facility]  C-SSRS RISK CATEGORY High Risk High Risk Moderate Risk       Last [facility] 2/9 Scores:     No data to display          Scribe for Treatment Team: Beatris Si, LCSW 01/09/2023 3:35 PM

## 2023-01-09 NOTE — Group Note (Signed)
Date:  01/09/2023 Time:  6:30 PM  Group Topic/Focus:  Goals Group:   The focus of this group is to help patients establish daily goals to achieve during treatment and discuss how the patient can incorporate goal setting into their daily lives to aide in recovery. Orientation:   The focus of this group is to educate the patient on the purpose and policies of crisis stabilization and provide a format to answer questions about their admission.  The group details unit policies and expectations of patients while admitted.    Participation Level:  Did Not Attend  Participation Quality:   n/a  Affect:   n/a  Cognitive:   n/a  Insight: None  Engagement in Group:   n/a  Modes of Intervention:   n/a  Additional Comments:   Pt did not attend.  Edmund Hilda Eliga Arvie 01/09/2023, 6:30 PM

## 2023-01-09 NOTE — Group Note (Signed)
Recreation Therapy Group Note   Group Topic:Problem Solving  Group Date: 01/09/2023 Start Time: 0930 End Time: 1002 Facilitators: Zemirah Krasinski-McCall, LRT,CTRS Location: 300 [address] Dayroom   Goal Area(s) Addresses:  Patient will effectively work in a team with other group members. Patient will verbalize importance of using appropriate problem solving techniques.  Patient will identify positive change associated with effective problem solving skills.   Group Description: Brain Teasers.  Patients were given two sheets of brain teasers. Patients were to complete the teasers to the best of their ability. Once completed, LRT and patients reviewed the answers. Patients would add all the questions they got correct, the person with the most correct answers got a prize. If there was a tie, LRT would ask a tie breaker question.    Affect/Mood: N/A   Participation Level: Did not attend    Clinical Observations/Individualized Feedback:     Plan: Continue to engage patient in RT group sessions 2-3x/week.   Maytte Jacot-McCall, LRT,CTRS 01/09/2023 12:36 PM

## 2023-01-09 NOTE — Progress Notes (Signed)
Patient ID: Yolanda [name], female   DOB: [birth year], 47 y.o.   MRN: [id number] [facility] MD Progress Note  01/09/2023 3:26 PM Yolanda [name]  MRN:  [id number]  Reason for admission: This is the first psychiatric admission in this [facility] in 12 years for this 21 AA female with an extensive hx of mental illnesses & probable polysubstance use disorders. She is admitted to the [facility] from the [facility] hospital with complain of worsening suicidal ideations with plan to stab herself. Per chart review, patient apparently reported at the ED that she has been depressed for a while & has not been taking her mental health medications. After medical evaluation.clearance, she was transferred to the [facility] for further psychiatric evaluation/treatments.  APS report on 6/24 Has payee, no legal guardian  Overnight events:  Staff reports that patient has hx of bedwetting & continues to experience this problem here at [facility]. When this happens, staff does help cleaning & sanitizing her bed & linens changed. Patient has not had any episodes of acute agitation or behavioral disturbance today.  No additional as needed medications were given. She remains in bed at this time.  Daily notes: Yolanda [name] is seen, char reviewed. The chart findings discussed with the treatment team. She is lying down in bed asleep. She is easily aroused. She reports, "My mood is good, I'm just sleepy. She says she slept well last night. I'm taking my medicines, no I'm not having any side effects". Patient remains on her current plan of care. There are no changes made. She does have hx & current bed-wetting issues. She has an order for a u/a, may initiate antibiotic therapy for UTI tomorrow.  Patient currently denies any SIHI, AVH, delusional thoughts or paranoia. She does not appear to responding to any internal stimuli. Still waiting on safe disposition as patient would not be able to live independently given cognitive impairment. Recommend assisted living.    Principal Problem: Schizoaffective disorder, depressive type (HCC)   Diagnosis: Principal Problem:   Schizoaffective disorder, depressive type (HCC) Active Problems:   GERD (gastroesophageal reflux disease)   Intellectual disability   Tobacco use disorder   Past Psychiatric History: See H&P.  Past Medical History:  Past Medical History:  Diagnosis Date   Bipolar affect, depressed (HCC)    Constipation 08/17/2022   Depression    Falls 07/21/2022   Fracture of femoral neck, right, closed (HCC) 01/17/2022   Herpes simplex 08/22/2017   Open fracture dislocation of right elbow joint 01/17/2022    Past Surgical History:  Procedure Laterality Date   NO PAST SURGERIES     SALPINGECTOMY     Family History: History reviewed. No pertinent family history.  Family Psychiatric  History: See H&P.  Social History:  Social History   Substance and Sexual Activity  Alcohol Use Yes     Social History   Substance and Sexual Activity  Drug Use Yes   Types: Cocaine, Marijuana    Social History   Socioeconomic History   Marital status: Single    Spouse name: Not on file   Number of children: Not on file   Years of education: Not on file   Highest education level: Not on file  Occupational History   Not on file  Tobacco Use   Smoking status: Every Day   Smokeless tobacco: Not on file  Substance and Sexual Activity   Alcohol use: Yes   Drug use: Yes    Types: Cocaine, Marijuana   Sexual activity:  Yes  Other Topics Concern   Not on file  Social History Narrative   Not on file   Social Determinants of Health   Financial Resource Strain: Not on file  Food Insecurity: Patient Declined (12/20/2022)   Hunger Vital Sign    Worried About Running Out of Food in the Last Year: Patient declined    Ran Out of Food in the Last Year: Patient declined  Transportation Needs: No Transportation Needs (12/20/2022)   PRAPARE - Administrator, Civil Service (Medical): No     Lack of Transportation (Non-Medical): No  Physical Activity: Not on file  Stress: Not on file  Social Connections: Not on file   Current Medications: Current Facility-Administered Medications  Medication Dose Route Frequency Provider Last Rate Last Admin   acetaminophen (TYLENOL) tablet 650 mg  650 mg Oral Q6H PRN [name], NP   650 mg at 12/29/22 1600   alum & mag hydroxide-simeth (MAALOX/MYLANTA) 200-200-20 MG/5ML suspension 30 mL  30 mL Oral Q4H PRN [name], NP       busPIRone (BUSPAR) tablet 15 mg  15 mg Oral BID [name], NP   15 mg at 01/09/23 8756   cyanocobalamin (VITAMIN B12) injection 1,000 mcg  1,000 mcg Intramuscular Weekly [name], Nadir, MD   1,000 mcg at 01/05/23 1049   Followed by   Melene Muller ON 02/02/2023] cyanocobalamin (VITAMIN B12) injection 1,000 mcg  1,000 mcg Intramuscular Q30 days [name], Nadir, MD       diphenhydrAMINE (BENADRYL) capsule 50 mg  50 mg Oral TID PRN [name], NP       Or   diphenhydrAMINE (BENADRYL) injection 50 mg  50 mg Intramuscular TID PRN [name], NP       haloperidol (HALDOL) tablet 5 mg  5 mg Oral TID PRN [name], NP       Or   haloperidol lactate (HALDOL) injection 5 mg  5 mg Intramuscular TID PRN [name], NP       hydrOXYzine (ATARAX) tablet 25 mg  25 mg Oral TID PRN [name], DO   25 mg at 01/08/23 2032   LORazepam (ATIVAN) tablet 2 mg  2 mg Oral TID PRN [name], NP       Or   LORazepam (ATIVAN) injection 2 mg  2 mg Intramuscular TID PRN [name], NP       magnesium hydroxide (MILK OF MAGNESIA) suspension 30 mL  30 mL Oral Daily PRN [name], NP       nicotine (NICODERM CQ - dosed in mg/24 hours) patch 21 mg  21 mg Transdermal Daily [name], DO   21 mg at 01/09/23 0818   paliperidone (INVEGA) 24 hr tablet 3 mg  3 mg Oral Daily [name], NP   3 mg at 01/09/23 0821   pantoprazole (PROTONIX) EC tablet 40 mg  40 mg Oral Daily [name], NP   40 mg at 01/09/23 0820    polyethylene glycol (MIRALAX / GLYCOLAX) packet 17 g  17 g Oral BID [name], DO   17 g at 01/08/23 2032   sertraline (ZOLOFT) tablet 100 mg  100 mg Oral Daily Park Pope, MD   100 mg at 01/09/23 0820   traZODone (DESYREL) tablet 50 mg  50 mg Oral QHS PRN [name], DO   50 mg at 01/08/23 2032   Lab Results:  No results found for this or any previous visit (from the past  48 hour(s)).  Blood Alcohol level:  Lab Results  Component Value Date   ETH <10 12/19/2022   ETH <10 11/21/2019   Metabolic Disorder Labs: Lab Results  Component Value Date   HGBA1C 4.4 (L) 12/21/2022   MPG 79.58 12/21/2022   No results found for: "PROLACTIN" Lab Results  Component Value Date   CHOL 218 (H) 12/21/2022   TRIG 81 12/21/2022   HDL 53 12/21/2022   CHOLHDL 4.1 12/21/2022   VLDL 16 12/21/2022   LDLCALC 149 (H) 12/21/2022   LDLCALC 110 (H) 03/13/2011   Physical Findings: AIMS: Facial and Oral Movements Muscles of Facial Expression: None, normal Lips and Perioral Area: None, normal Jaw: None, normal Tongue: None, normal,Extremity Movements Upper (arms, wrists, hands, fingers): None, normal Lower (legs, knees, ankles, toes): None, normal, Trunk Movements Neck, shoulders, hips: None, normal, Overall Severity Severity of abnormal movements (highest score from questions above): None, normal Incapacitation due to abnormal movements: None, normal Patient's awareness of abnormal movements (rate only patient's report): No Awareness, Dental Status Current problems with teeth and/or dentures?: No Does patient usually wear dentures?: No   Musculoskeletal: Strength & Muscle Tone: within normal limits Gait & Station: normal Patient leans: N/A  Psychiatric Specialty Exam:  Presentation  General Appearance:  Lying down in bed, better hygiene noted  Eye Contact: Fair Speech: Clear and Coherent  Speech Volume: Decreased  Handedness: Right  Mood and Affect   Mood: Anxious  Affect: Constricted  Thought Process  Thought Processes: Coherent  Descriptions of Associations:Intact  Orientation:Full (Time, Place and Person)  Thought Content: Denies SI HI or AVH does not present responding to stimuli. Presentation consistent with moderate intellectual disability  History of Schizophrenia/Schizoaffective disorder:Yes  Duration of Psychotic Symptoms:Greater than six months  Hallucinations:Hallucinations: None   Ideas of Reference:None  Suicidal Thoughts:Suicidal Thoughts: No   Homicidal Thoughts:Homicidal Thoughts: No   Sensorium  Memory: Immediate Fair; Remote Fair; Recent Fair  Judgment: Limited Insight: Limited Executive Functions  Concentration: Fair  Attention Span: Fair  Recall: [employer] of Knowledge: Fair  Language: Fair  Psychomotor Activity  Psychomotor Activity: Psychomotor Activity: Normal   Assets  Assets: Leisure Time; Resilience  Sleep  Sleep: Sleep: Fair  Physical Exam: Physical Exam Vitals and nursing note reviewed.  Constitutional:      General: She is not in acute distress.    Appearance: She is not ill-appearing or diaphoretic.  HENT:     Head: Normocephalic.     Nose: No congestion.     Mouth/Throat:     Pharynx: Oropharynx is clear.  Eyes:     Pupils: Pupils are equal, round, and reactive to light.  Pulmonary:     Effort: Pulmonary effort is normal. No respiratory distress.  Genitourinary:    Comments: Deferred Musculoskeletal:        General: Normal range of motion.     Cervical back: Normal range of motion.  Skin:    General: Skin is warm and dry.  Neurological:     General: No focal deficit present.     Mental Status: She is alert.    Blood pressure (!) 122/101, pulse 96, temperature 98.3 F (36.8 C), temperature source Oral, resp. rate 20, height 4\' 11"  (1.499 m), weight 63 kg, SpO2 100 %. Body mass index is 28.05 kg/m.  Treatment Plan Summary: Daily  contact with patient to assess and evaluate symptoms and progress in treatment and Medication management.   Still waiting on safe disposition as patient would not  be able to live independently given cognitive impairment. Recommend assisted living.   No medication side effects.  BP normalized, likely 2/2 machine error over the past day.  Because of urinary incontinence, recommend adult diapers.  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  Plan: Re-initiated:  -Continue Buspar 15 mg po bid for anxiety.  -Continue paliperidone 3 mg po daily for mood control -Continued Sertraline 100 mg po Q daily for depression -Continue Trazodone 50 mg po Q bedtime prn for insomnia -Continue Nicoderm topically Q 24 hrs for nicotine withdrawal management -Continue vitamin B12 1000 mcg IM weekly for 4 weeks then monthly  Cont to encourage wearing diapers Lab work and EKG, reviewed with no significant abnormalities noted, exc ept slightly low b12 166.  Safety and Monitoring: Voluntary admission to inpatient psychiatric unit for safety, stabilization and treatment Daily contact with patient to assess and evaluate symptoms and progress in treatment Patient's case to be discussed in multi-disciplinary team meeting Observation Level : q15 minute checks Vital signs: q12 hours Precautions: Safety   Discharge Planning: Social work and case management to assist with discharge planning and identification of hospital follow-up needs prior to discharge Estimated LOS: Unknown at this time. Discharge Concerns: Need to establish a safety plan; Medication compliance and effectiveness Discharge Goals: Return home with outpatient referrals for mental health  follow-up including medication management/psychotherapy No legal guardian, has payee  Armandina Stammer, NP, pmhnp, fnp-bc. 01/09/2023, 3:26 PMPatient ID: Yolanda [name], female   DOB: 1975-09-16, 47 y.o.   MRN: [id number] Patient ID: Yolanda [name], female   DOB: [birth year], 47 y.o.   MRN: [id number]

## 2023-01-09 NOTE — Progress Notes (Signed)
Urine collected and placed in appropriate fridge for pickup

## 2023-01-09 NOTE — Progress Notes (Signed)
   01/09/23 0000  Psych Admission Type (Psych Patients Only)  Admission Status Voluntary  Psychosocial Assessment  Patient Complaints Isolation  Eye Contact Fair  Facial Expression Animated  Affect Appropriate to circumstance  Speech Logical/coherent  Interaction Assertive  Motor Activity Slow  Appearance/Hygiene Unremarkable  Behavior Characteristics Appropriate to situation;Cooperative  Mood Pleasant  Thought Process  Coherency WDL  Content WDL  Delusions None reported or observed  Perception WDL  Hallucination None reported or observed  Judgment Poor  Confusion Mild  Danger to Self  Current suicidal ideation? Denies  Self-Injurious Behavior No self-injurious ideation or behavior indicators observed or expressed   Agreement Not to Harm Self Yes  Description of Agreement verbal  Danger to Others  Danger to Others None reported or observed

## 2023-01-10 DIAGNOSIS — F251 Schizoaffective disorder, depressive type: Secondary | ICD-10-CM | POA: Diagnosis not present

## 2023-01-10 MED ORDER — SULFAMETHOXAZOLE-TRIMETHOPRIM 800-160 MG PO TABS
1.0000 | ORAL_TABLET | Freq: Two times a day (BID) | ORAL | Status: AC
  Administered 2023-01-10 – 2023-01-16 (×14): 1 via ORAL
  Filled 2023-01-10 (×15): qty 1

## 2023-01-10 MED ORDER — HYDROXYZINE HCL 50 MG PO TABS
50.0000 mg | ORAL_TABLET | Freq: Every day | ORAL | Status: DC
  Administered 2023-01-10 – 2023-01-11 (×2): 50 mg via ORAL
  Filled 2023-01-10 (×5): qty 1

## 2023-01-10 MED ORDER — TRAZODONE HCL 50 MG PO TABS
50.0000 mg | ORAL_TABLET | Freq: Every day | ORAL | Status: DC
  Administered 2023-01-10 – 2023-01-21 (×12): 50 mg via ORAL
  Filled 2023-01-10 (×17): qty 1

## 2023-01-10 NOTE — Group Note (Signed)
Date:  01/10/2023 Time:  12:17 PM  Group Topic/Focus:     SOCIAL WORK GROUP                                                   [facility] LCSW Group Therapy Note    Group Date: @GROUPDATE @ Start Time: @GROUPSTARTTIME @ End Time: @GROUPENDTIME @  Type of Therapy and Topic:  Group Therapy:  Overcoming Obstacles  Participation Level:    Mood:  Description of Group:   In this group patients will be encouraged to explore what they see as obstacles to their own wellness and recovery. They will be guided to discuss their thoughts, feelings, and behaviors related to these obstacles. The group will process together ways to cope with barriers, with attention given to specific choices patients can make. Each patient will be challenged to identify changes they are motivated to make in order to overcome their obstacles. This group will be process-oriented, with patients participating in exploration of their own experiences as well as giving and receiving support and challenge from other group members.  Therapeutic Goals: 1. Patient will identify personal and current obstacles as they relate to admission. 2. Patient will identify barriers that currently interfere with their wellness or overcoming obstacles.  3. Patient will identify feelings, thought process and behaviors related to these barriers. 4. Patient will identify two changes they are willing to make to overcome these obstacles:    Summary of Patient Progress     Therapeutic Modalities:   Cognitive Behavioral Therapy Solution Focused Therapy Motivational Interviewing Relapse Prevention Therapy   Samayah Novinger M Rock[place] Leinen   Participation Level:  Did Not Attend  Participation Quality:    Affect:    Cognitive:    Insight:   Engagement in Group:    Modes of Intervention:    Additional Comments:    Memory Dance Navaeh Kehres 01/10/2023, 12:17 PM

## 2023-01-10 NOTE — Plan of Care (Signed)
  Problem: Education: Goal: Knowledge of the prescribed therapeutic regimen will improve Outcome: Progressing   Problem: Coping: Goal: Coping ability will improve Outcome: Progressing   Problem: Safety: Goal: Ability to disclose and discuss suicidal ideas will improve Outcome: Progressing   Problem: Self-Concept: Goal: Level of anxiety will decrease Outcome: Progressing   Problem: Education: Goal: Knowledge of the prescribed therapeutic regimen will improve Outcome: Progressing

## 2023-01-10 NOTE — Progress Notes (Signed)
   01/09/23 2200  Psych Admission Type (Psych Patients Only)  Admission Status Voluntary  Psychosocial Assessment  Patient Complaints Anxiety;Depression  Eye Contact Fair  Facial Expression Flat  Affect Sad  Speech Slow  Interaction Assertive  Motor Activity Slow  Appearance/Hygiene Body odor;Disheveled;Poor hygiene  Behavior Characteristics Cooperative  Mood Depressed  Thought Process  Coherency WDL  Content WDL  Delusions None reported or observed  Perception WDL  Hallucination None reported or observed  Judgment Poor  Confusion None  Danger to Self  Current suicidal ideation? Denies  Self-Injurious Behavior No self-injurious ideation or behavior indicators observed or expressed   Agreement Not to Harm Self Yes  Description of Agreement Verbal contract  Danger to Others  Danger to Others None reported or observed

## 2023-01-10 NOTE — Progress Notes (Signed)
   01/10/23 0515  15 Minute Checks  Location Bedroom  Visual Appearance Calm  Behavior Sleeping  Sleep (Behavioral Health Patients Only)  Calculate sleep? (Click Yes once per 24 hr at 0600 safety check) Yes  Documented sleep last 24 hours 6.5

## 2023-01-10 NOTE — Progress Notes (Signed)
   01/10/23 0817  Psych Admission Type (Psych Patients Only)  Admission Status Voluntary  Psychosocial Assessment  Patient Complaints Sadness  Eye Contact Fair  Facial Expression Animated  Affect Sad  Speech Slow  Interaction Assertive  Motor Activity Slow  Appearance/Hygiene Body odor  Behavior Characteristics Cooperative  Mood Pleasant;Labile;Sad  Thought Process  Coherency WDL  Content WDL  Delusions None reported or observed  Perception WDL  Hallucination None reported or observed  Judgment Poor  Confusion None  Danger to Self  Current suicidal ideation? Denies  Self-Injurious Behavior No self-injurious ideation or behavior indicators observed or expressed   Agreement Not to Harm Self Yes  Description of Agreement verbal  Danger to Others  Danger to Others None reported or observed   Pt pleasant and cooperative. Pt initially happy during initial assessment, but gets sad at times during conversation before going back to being happy and smiling again. Pt verbally educated on proper peri care to help avoid UTI's and verbalizes understanding.

## 2023-01-10 NOTE — Group Note (Unsigned)
Date:  01/10/2023 Time:  10:04 AM  Group Topic/Focus:  Orientation:   The focus of this group is to educate the patient on the purpose and policies of crisis stabilization and provide a format to answer questions about their admission.  The group details unit policies and expectations of patients while admitted.     Participation Level:  {[facility] PARTICIPATION LEVEL:22264}  Participation Quality:  {[facility] PARTICIPATION QUALITY:22265}  Affect:  {[facility] AFFECT:22266}  Cognitive:  {[facility] COGNITIVE:22267}  Insight: {[facility] Insight2:20797}  Engagement in Group:  {[facility] ENGAGEMENT IN GROUP:22268}  Modes of Intervention:  {[facility] MODES OF INTERVENTION:22269}  Additional Comments:  ***  Yolanda [name] D [name] 01/10/2023, 10:04 AM  

## 2023-01-10 NOTE — Progress Notes (Signed)
Patient ID: Yolanda [name], female   DOB: 1975-12-15, 47 y.o.   MRN: [id number] [facility] MD Progress Note  01/10/2023 4:11 PM Yolanda [name]  MRN:  [id number]  Reason for admission: This is the first psychiatric admission in this [facility] in 12 years for this 29 AA female with an extensive hx of mental illnesses & probable polysubstance use disorders. She is admitted to the [facility] from the [facility] hospital with complain of worsening suicidal ideations with plan to stab herself. Per chart review, patient apparently reported at the ED that she has been depressed for a while & has not been taking her mental health medications. After medical evaluation.clearance, she was transferred to the [facility] for further psychiatric evaluation/treatments.  APS report on 6/24 Has payee, no legal guardian  Overnight events:  Staff reports that patient has hx of bedwetting & continues to experience this problem here at [facility] from time to time. When this happens, staff does help cleaning & sanitizing her bed & linens changed. Patient has not had any episodes of acute agitation or behavioral disturbance today. She remains in bed at this time.  Daily notes:  Yolanda [name] is seen in her room, chart reviewed. The chart findings discussed with the treatment team. She is lying down in bed reading her bible. She is easily aroused. She reports while crying, "I'm here in my room because I can't go to the day room. The people are making fun of me. Talking about me & calling me retarded. I want the social work to know that I wanna get my own place when I get out of here. I will also need a nurse to help me out when I need help. I did not sleep well last night because I was thinking about my daughters & my grandson. That is why my mood is up & down today". Patient currently denies any SIHI, AVH, delusional thoughts or paranoia. She is encouraged to get out of bed to go to the day room for group sessions. She is instructed to report to any of the staff  if anyone is bothering her or calling her bad names. Her medications has been adjusted to help her sleep better at night. Will continue current plan of care as already in progress. Still waiting on safe disposition as patient would not be able to live independently given cognitive impairment. Recommend assisted living.   Principal Problem: Schizoaffective disorder, depressive type (HCC)   Diagnosis: Principal Problem:   Schizoaffective disorder, depressive type (HCC) Active Problems:   GERD (gastroesophageal reflux disease)   Intellectual disability   Tobacco use disorder   Past Psychiatric History: See H&P.  Past Medical History:  Past Medical History:  Diagnosis Date   Bipolar affect, depressed (HCC)    Constipation 08/17/2022   Depression    Falls 07/21/2022   Fracture of femoral neck, right, closed (HCC) 01/17/2022   Herpes simplex 08/22/2017   Open fracture dislocation of right elbow joint 01/17/2022    Past Surgical History:  Procedure Laterality Date   NO PAST SURGERIES     SALPINGECTOMY     Family History: History reviewed. No pertinent family history.  Family Psychiatric  History: See H&P.  Social History:  Social History   Substance and Sexual Activity  Alcohol Use Yes     Social History   Substance and Sexual Activity  Drug Use Yes   Types: Cocaine, Marijuana    Social History   Socioeconomic History   Marital status: Single  Spouse name: Not on file   Number of children: Not on file   Years of education: Not on file   Highest education level: Not on file  Occupational History   Not on file  Tobacco Use   Smoking status: Every Day   Smokeless tobacco: Not on file  Substance and Sexual Activity   Alcohol use: Yes   Drug use: Yes    Types: Cocaine, Marijuana   Sexual activity: Yes  Other Topics Concern   Not on file  Social History Narrative   Not on file   Social Determinants of Health   Financial Resource Strain: Not on file  Food  Insecurity: Patient Declined (12/20/2022)   Hunger Vital Sign    Worried About Running Out of Food in the Last Year: Patient declined    Ran Out of Food in the Last Year: Patient declined  Transportation Needs: No Transportation Needs (12/20/2022)   PRAPARE - Administrator, Civil Service (Medical): No    Lack of Transportation (Non-Medical): No  Physical Activity: Not on file  Stress: Not on file  Social Connections: Not on file   Current Medications: Current Facility-Administered Medications  Medication Dose Route Frequency Provider Last Rate Last Admin   acetaminophen (TYLENOL) tablet 650 mg  650 mg Oral Q6H PRN [name], NP   650 mg at 12/29/22 1600   alum & mag hydroxide-simeth (MAALOX/MYLANTA) 200-200-20 MG/5ML suspension 30 mL  30 mL Oral Q4H PRN [name], NP       busPIRone (BUSPAR) tablet 15 mg  15 mg Oral BID [name], NP   15 mg at 01/10/23 1610   cyanocobalamin (VITAMIN B12) injection 1,000 mcg  1,000 mcg Intramuscular Weekly [name], Nadir, MD   1,000 mcg at 01/05/23 1049   Followed by   Melene Muller ON 02/02/2023] cyanocobalamin (VITAMIN B12) injection 1,000 mcg  1,000 mcg Intramuscular Q30 days [name], Nadir, MD       diphenhydrAMINE (BENADRYL) capsule 50 mg  50 mg Oral TID PRN [name], NP       Or   diphenhydrAMINE (BENADRYL) injection 50 mg  50 mg Intramuscular TID PRN [name], NP       haloperidol (HALDOL) tablet 5 mg  5 mg Oral TID PRN [name], NP       Or   haloperidol lactate (HALDOL) injection 5 mg  5 mg Intramuscular TID PRN [name], NP       hydrOXYzine (ATARAX) tablet 25 mg  25 mg Oral TID PRN [name], DO   25 mg at 01/09/23 2153   LORazepam (ATIVAN) tablet 2 mg  2 mg Oral TID PRN [name], NP       Or   LORazepam (ATIVAN) injection 2 mg  2 mg Intramuscular TID PRN [name], NP       magnesium hydroxide (MILK OF MAGNESIA) suspension 30 mL  30 mL Oral Daily PRN [name], NP       nicotine (NICODERM  CQ - dosed in mg/24 hours) patch 21 mg  21 mg Transdermal Daily [name], DO   21 mg at 01/10/23 0817   paliperidone (INVEGA) 24 hr tablet 3 mg  3 mg Oral Daily [name], NP   3 mg at 01/10/23 0817   pantoprazole (PROTONIX) EC tablet 40 mg  40 mg Oral Daily [name], NP   40 mg at 01/10/23 0817   polyethylene glycol (MIRALAX / GLYCOLAX) packet 17  g  17 g Oral BID [name], DO   17 g at 01/09/23 1710   sertraline (ZOLOFT) tablet 100 mg  100 mg Oral Daily Park Pope, MD   100 mg at 01/10/23 4098   sulfamethoxazole-trimethoprim (BACTRIM DS) 800-160 MG per tablet 1 tablet  1 tablet Oral Q12H [name], NP   1 tablet at 01/10/23 1030   traZODone (DESYREL) tablet 50 mg  50 mg Oral QHS PRN [name], DO   50 mg at 01/09/23 2153   Lab Results:  Results for orders placed or performed during the hospital encounter of 12/20/22 (from the past 48 hour(s))  Urinalysis, Routine w reflex microscopic -Urine, Clean Catch     Status: Abnormal   Collection Time: 01/09/23  6:55 PM  Result Value Ref Range   Color, Urine YELLOW YELLOW   APPearance CLOUDY (A) CLEAR   Specific Gravity, Urine 1.017 1.005 - 1.030   pH 6.0 5.0 - 8.0   Glucose, UA NEGATIVE NEGATIVE mg/dL   Hgb urine dipstick NEGATIVE NEGATIVE   Bilirubin Urine NEGATIVE NEGATIVE   Ketones, ur NEGATIVE NEGATIVE mg/dL   Protein, ur NEGATIVE NEGATIVE mg/dL   Nitrite POSITIVE (A) NEGATIVE   Leukocytes,Ua SMALL (A) NEGATIVE   RBC / HPF 0-5 0 - 5 RBC/hpf   WBC, UA 21-50 0 - 5 WBC/hpf   Bacteria, UA RARE (A) NONE SEEN   Squamous Epithelial / HPF 11-20 0 - 5 /HPF   Mucus PRESENT     Comment: Performed at [facility], [address], [place], [place] [address]    Blood Alcohol level:  Lab Results  Component Value Date   [facility] <10 12/19/2022   ETH <10 11/21/2019   Metabolic Disorder Labs: Lab Results  Component Value Date   HGBA1C 4.4 (L) 12/21/2022   MPG 79.58 12/21/2022   No results found for:  "PROLACTIN" Lab Results  Component Value Date   CHOL 218 (H) 12/21/2022   TRIG 81 12/21/2022   HDL 53 12/21/2022   CHOLHDL 4.1 12/21/2022   VLDL 16 12/21/2022   LDLCALC 149 (H) 12/21/2022   LDLCALC 110 (H) 03/13/2011   Physical Findings: AIMS: Facial and Oral Movements Muscles of Facial Expression: None, normal Lips and Perioral Area: None, normal Jaw: None, normal Tongue: None, normal,Extremity Movements Upper (arms, wrists, hands, fingers): None, normal Lower (legs, knees, ankles, toes): None, normal, Trunk Movements Neck, shoulders, hips: None, normal, Overall Severity Severity of abnormal movements (highest score from questions above): None, normal Incapacitation due to abnormal movements: None, normal Patient's awareness of abnormal movements (rate only patient's report): No Awareness, Dental Status Current problems with teeth and/or dentures?: No Does patient usually wear dentures?: No   Musculoskeletal: Strength & Muscle Tone: within normal limits Gait & Station: normal Patient leans: N/A  Psychiatric Specialty Exam:  Presentation  General Appearance:  Lying down in bed, better hygiene noted  Eye Contact: Fair Speech: Slow; Clear and Coherent  Speech Volume: Decreased  Handedness: Right  Mood and Affect  Mood: -- (Improving)  Affect: -- (Presents tearful today.)  Thought Process  Thought Processes: Disorganized  Descriptions of Associations:Tangential  Orientation:Partial  Thought Content: Denies SI HI or AVH does not present responding to stimuli. Presentation consistent with moderate intellectual disability  History of Schizophrenia/Schizoaffective disorder:Yes  Duration of Psychotic Symptoms:Greater than six months  Hallucinations:Hallucinations: None    Ideas of Reference:None  Suicidal Thoughts:Suicidal Thoughts: No    Homicidal Thoughts:Homicidal Thoughts: No    Sensorium  Memory: Immediate Poor; Recent Poor; Remote  Poor  Judgment: Limited Insight: Limited Executive Functions  Concentration: Fair  Attention Span: Fair  Recall: Fair  Fund of Knowledge: Poor  Language: Fair  Psychomotor Activity  Psychomotor Activity: Psychomotor Activity: Normal    Assets  Assets: Communication Skills; Desire for Improvement; Financial Resources/Insurance; Resilience; Social Support  Sleep  Sleep: Sleep: Fair Number of Hours of Sleep: 6   Physical Exam: Physical Exam Vitals and nursing note reviewed.  Constitutional:      General: She is not in acute distress.    Appearance: She is not ill-appearing or diaphoretic.  HENT:     Head: Normocephalic.     Nose: No congestion.     Mouth/Throat:     Pharynx: Oropharynx is clear.  Eyes:     Pupils: Pupils are equal, round, and reactive to light.  Pulmonary:     Effort: Pulmonary effort is normal. No respiratory distress.  Genitourinary:    Comments: Deferred Musculoskeletal:        General: Normal range of motion.     Cervical back: Normal range of motion.  Skin:    General: Skin is warm and dry.  Neurological:     General: No focal deficit present.     Mental Status: She is alert.    Blood pressure 110/74, pulse 76, temperature 97.8 F (36.6 C), temperature source Oral, resp. rate 16, height 4\' 11"  (1.499 m), weight 63 kg, SpO2 100 %. Body mass index is 28.05 kg/m.  Treatment Plan Summary: Daily contact with patient to assess and evaluate symptoms and progress in treatment and Medication management.   Still waiting on safe disposition as patient would not be able to live independently given cognitive impairment. Recommend assisted living.   No medication side effects.  BP normalized, likely 2/2 machine error over the past day.  Because of urinary incontinence, recommend adult diapers.  Principal/active diagnoses:  Principal Problem:   Schizoaffective disorder, depressive type (HCC) Active Problems:   GERD  (gastroesophageal reflux disease)   Intellectual disability   Tobacco use disorder (MOCA 16/30) moderate cognitive impairment probably related to moderate intellectual disability.  Plan: Re-initiated:  -Continue Buspar 15 mg po bid for anxiety.  -Continue paliperidone 3 mg po daily for mood control -Continued Sertraline 100 mg po Q daily for depression -Continue Trazodone 50 mg po Q bedtime prn for insomnia -Continue Nicoderm topically Q 24 hrs for nicotine withdrawal management -Continue vitamin B12 1000 mcg IM weekly for 4 weeks then monthly  Cont to encourage wearing diapers Lab work and EKG, reviewed with no significant abnormalities noted, exc ept slightly low b12 166.  Safety and Monitoring: Voluntary admission to inpatient psychiatric unit for safety, stabilization and treatment Daily contact with patient to assess and evaluate symptoms and progress in treatment Patient's case to be discussed in multi-disciplinary team meeting Observation Level : q15 minute checks Vital signs: q12 hours Precautions: Safety   Discharge Planning: Social work and case management to assist with discharge planning and identification of hospital follow-up needs prior to discharge Estimated LOS: Unknown at this time. Discharge Concerns: Need to establish a safety plan; Medication compliance and effectiveness Discharge Goals: Return home with outpatient referrals for mental health follow-up including medication management/psychotherapy No legal guardian, has payee  Armandina Stammer, NP, pmhnp, fnp-bc. 01/10/2023, 4:11 PMPatient ID: Yolanda [name], female   DOB: 01-06-1976, 47 y.o.   MRN: [id number] Patient ID: Yolanda [name], female   DOB: 21-May-1976, 47 y.o.   MRN:  [id number] Patient ID: Yolanda [name], female   DOB: 01/23/1976, 47 y.o.   MRN: [id number]

## 2023-01-10 NOTE — [facility] Group Notes (Signed)
[facility] Group Notes:  (Nursing/MHT/Case Management/Adjunct)  Date:  01/10/2023  Time:  9:00 PM  Type of Therapy:   Wrap-up group  Participation Level:  Did Not Attend  Participation Quality:    Affect:    Cognitive:    Insight:    Engagement in Group:    Modes of Intervention:    Summary of Progress/Problems: Didn't attend.   Yolanda [name] 01/10/2023, 9:00 PM

## 2023-01-11 NOTE — Progress Notes (Addendum)
Patient isolative to her room. Patient was tearful during assessment, patient stated "I don't think I'm going to be happy again."When asked the reason why she feels think that way, patient became more tearful and did not give more details. Physical and emotional support provided. Patient rated her anxiety 10/10, depression 10/10, and hopelessness 5/10. Patient denies SI/HI/AVH.  Patient  assisted with ADL's by staff, and encouraged to keep her room clean. Patient compliant with medications without side effects. No distress noted, patient verbally contract for safety, patient kept safe on the unit at q 15 minutes checks.  01/10/23 2120  Psych Admission Type (Psych Patients Only)  Admission Status Voluntary  Psychosocial Assessment  Patient Complaints Anxiety;Depression;Hopelessness  Eye Contact Fair  Facial Expression Sad  Affect Depressed  Speech Slow  Interaction Isolative;Minimal  Motor Activity Slow  Appearance/Hygiene Disheveled;Body odor  Behavior Characteristics Unwilling to participate  Mood Depressed  Thought Process  Coherency WDL  Content WDL  Delusions None reported or observed  Perception WDL  Hallucination None reported or observed  Judgment Poor  Confusion None  Danger to Self  Current suicidal ideation? Denies  Self-Injurious Behavior No self-injurious ideation or behavior indicators observed or expressed   Agreement Not to Harm Self Yes  Description of Agreement Verbal contract  Danger to Others  Danger to Others None reported or observed

## 2023-01-11 NOTE — [facility] Group Notes (Signed)
[facility] Group Notes:  (Nursing/MHT/Case Management/Adjunct)  Date:  01/11/2023  Time:  8:40 PM  Type of Therapy:   AA group  Participation Level:  Did Not Attend  Participation Quality:    Affect:    Cognitive:    Insight:    Engagement in Group:    Modes of Intervention:    Summary of Progress/Problems: Didn't attend.   Noah Delaine 01/11/2023, 8:40 PM

## 2023-01-11 NOTE — Group Note (Signed)
Date:  01/11/2023 Time:  4:38 PM  Group Topic/Focus:  Diagnosis Education:   The focus of this group is to discuss the major disorders that patients maybe diagnosed with.  Group discusses the importance of knowing what one's diagnosis is so that one can understand treatment and better advocate for oneself.    Participation Level:  Did Not Attend   Harriet Masson 01/11/2023, 4:38 PM

## 2023-01-11 NOTE — [facility] Group Notes (Signed)
Adult Psychoeducational Group Note  Date:  01/11/2023 Time:  9:33 AM  Group Topic/Focus:  Goals Group:   The focus of this group is to help patients establish daily goals to achieve during treatment and discuss how the patient can incorporate goal setting into their daily lives to aide in recovery. Orientation:   The focus of this group is to educate the patient on the purpose and policies of crisis stabilization and provide a format to answer questions about their admission.  The group details unit policies and expectations of patients while admitted.  Participation Level:  Did Not Attend  Participation Quality:    Affect:    Cognitive:    Insight:   Engagement in Group:    Modes of Intervention:    Additional Comments:  Pt did not attend group  [employer] 01/11/2023, 9:33 AM

## 2023-01-11 NOTE — Progress Notes (Signed)
Patient ID: Yolanda [name], female   DOB: [birth year], 47 y.o.   MRN: [id number] [facility] MD Progress Note  01/11/2023 10:38 AM Yolanda [name]  MRN:  [id number]  Reason for admission: This is the first psychiatric admission in this [facility] in 12 years for this 78 AA female with an extensive hx of mental illnesses & probable polysubstance use disorders. She is admitted to the [facility] from the [facility] hospital with complain of worsening suicidal ideations with plan to stab herself. Per chart review, patient apparently reported at the ED that she has been depressed for a while & has not been taking her mental health medications. After medical evaluation.clearance, she was transferred to the [facility] for further psychiatric evaluation/treatments.  APS report on 6/24 Has payee, no legal guardian  Overnight events:  Staff reports that patient has hx of bedwetting & continues to experience this problem here at [facility] from time to time. When this happens, staff does help cleaning & sanitizing her bed & linens changed. Patient has not had any episodes of acute agitation or behavioral disturbance today.   Daily notes: Patient seen today during morning rounds.  Case discussed in meeting with staff.  Chart reviewed.  Patient today endorses anxious and depressed mood.  He reports he is depressed about his living situation and also others leaking from the heart.  Patient was provided with support and reassurance.  Patient reports that she took shower this morning and had been taking care of her hygiene.  Patient was encouraged to wear diapers due to urinary incontinence.  Patient was encouraged to attend group and work on coping strategies.  Patient reports that she does not like to attend groups because she thinks" people make fun of her".  Patient was in need of constant reassurance.  She denies any intention or plan to harm herself on the unit.  She denies psychotic symptoms.  She denies side effects from medicine.  Principal  Problem: Schizoaffective disorder, depressive type (HCC)   Diagnosis: Principal Problem:   Schizoaffective disorder, depressive type (HCC) Active Problems:   GERD (gastroesophageal reflux disease)   Intellectual disability   Tobacco use disorder   Past Psychiatric History: See H&P.  Past Medical History:  Past Medical History:  Diagnosis Date   Bipolar affect, depressed (HCC)    Constipation 08/17/2022   Depression    Falls 07/21/2022   Fracture of femoral neck, right, closed (HCC) 01/17/2022   Herpes simplex 08/22/2017   Open fracture dislocation of right elbow joint 01/17/2022    Past Surgical History:  Procedure Laterality Date   NO PAST SURGERIES     SALPINGECTOMY     Family History: History reviewed. No pertinent family history.  Family Psychiatric  History: See H&P.  Social History:  Social History   Substance and Sexual Activity  Alcohol Use Yes     Social History   Substance and Sexual Activity  Drug Use Yes   Types: Cocaine, Marijuana    Social History   Socioeconomic History   Marital status: Single    Spouse name: Not on file   Number of children: Not on file   Years of education: Not on file   Highest education level: Not on file  Occupational History   Not on file  Tobacco Use   Smoking status: Every Day   Smokeless tobacco: Not on file  Substance and Sexual Activity   Alcohol use: Yes   Drug use: Yes    Types: Cocaine, Marijuana  Sexual activity: Yes  Other Topics Concern   Not on file  Social History Narrative   Not on file   Social Determinants of Health   Financial Resource Strain: Not on file  Food Insecurity: Patient Declined (12/20/2022)   Hunger Vital Sign    Worried About Running Out of Food in the Last Year: Patient declined    Ran Out of Food in the Last Year: Patient declined  Transportation Needs: No Transportation Needs (12/20/2022)   PRAPARE - Administrator, Civil Service (Medical): No    Lack of  Transportation (Non-Medical): No  Physical Activity: Not on file  Stress: Not on file  Social Connections: Not on file   Current Medications: Current Facility-Administered Medications  Medication Dose Route Frequency Provider Last Rate Last Admin   acetaminophen (TYLENOL) tablet 650 mg  650 mg Oral Q6H PRN [name], NP   650 mg at 12/29/22 1600   alum & mag hydroxide-simeth (MAALOX/MYLANTA) 200-200-20 MG/5ML suspension 30 mL  30 mL Oral Q4H PRN [name], NP       busPIRone (BUSPAR) tablet 15 mg  15 mg Oral BID [name], NP   15 mg at 01/11/23 1610   cyanocobalamin (VITAMIN B12) injection 1,000 mcg  1,000 mcg Intramuscular Weekly [name], Nadir, MD   1,000 mcg at 01/05/23 1049   Followed by   Melene Muller ON 02/02/2023] cyanocobalamin (VITAMIN B12) injection 1,000 mcg  1,000 mcg Intramuscular Q30 days [name], Nadir, MD       diphenhydrAMINE (BENADRYL) capsule 50 mg  50 mg Oral TID PRN [name], NP       Or   diphenhydrAMINE (BENADRYL) injection 50 mg  50 mg Intramuscular TID PRN [name], NP       haloperidol (HALDOL) tablet 5 mg  5 mg Oral TID PRN [name], NP       Or   haloperidol lactate (HALDOL) injection 5 mg  5 mg Intramuscular TID PRN [name], NP       hydrOXYzine (ATARAX) tablet 25 mg  25 mg Oral TID PRN [name], DO   25 mg at 01/09/23 2153   hydrOXYzine (ATARAX) tablet 50 mg  50 mg Oral QHS [name], NP   50 mg at 01/10/23 2113   LORazepam (ATIVAN) tablet 2 mg  2 mg Oral TID PRN [name], NP       Or   LORazepam (ATIVAN) injection 2 mg  2 mg Intramuscular TID PRN [name], NP       magnesium hydroxide (MILK OF MAGNESIA) suspension 30 mL  30 mL Oral Daily PRN [name], NP       nicotine (NICODERM CQ - dosed in mg/24 hours) patch 21 mg  21 mg Transdermal Daily [name], DO   21 mg at 01/10/23 0817   paliperidone (INVEGA) 24 hr tablet 3 mg  3 mg Oral Daily [name], NP   3 mg at 01/11/23 0835   pantoprazole  (PROTONIX) EC tablet 40 mg  40 mg Oral Daily [name], NP   40 mg at 01/11/23 0836   polyethylene glycol (MIRALAX / GLYCOLAX) packet 17 g  17 g Oral BID [name], DO   17 g at 01/11/23 0835   sertraline (ZOLOFT) tablet 100 mg  100 mg Oral Daily Park Pope, MD   100 mg at 01/11/23 0835   sulfamethoxazole-trimethoprim (BACTRIM DS) 800-160 MG per tablet 1 tablet  1 tablet Oral  Q12H [name], NP   1 tablet at 01/11/23 1610   traZODone (DESYREL) tablet 50 mg  50 mg Oral QHS [name], NP   50 mg at 01/10/23 2112   Lab Results:  Results for orders placed or performed during the hospital encounter of 12/20/22 (from the past 48 hour(s))  Urinalysis, Routine w reflex microscopic -Urine, Clean Catch     Status: Abnormal   Collection Time: 01/09/23  6:55 PM  Result Value Ref Range   Color, Urine YELLOW YELLOW   APPearance CLOUDY (A) CLEAR   Specific Gravity, Urine 1.017 1.005 - 1.030   pH 6.0 5.0 - 8.0   Glucose, UA NEGATIVE NEGATIVE mg/dL   Hgb urine dipstick NEGATIVE NEGATIVE   Bilirubin Urine NEGATIVE NEGATIVE   Ketones, ur NEGATIVE NEGATIVE mg/dL   Protein, ur NEGATIVE NEGATIVE mg/dL   Nitrite POSITIVE (A) NEGATIVE   Leukocytes,Ua SMALL (A) NEGATIVE   RBC / HPF 0-5 0 - 5 RBC/hpf   WBC, UA 21-50 0 - 5 WBC/hpf   Bacteria, UA RARE (A) NONE SEEN   Squamous Epithelial / HPF 11-20 0 - 5 /HPF   Mucus PRESENT     Comment: Performed at [facility], [address], [place], [place] [address]    Blood Alcohol level:  Lab Results  Component Value Date   [facility] <10 12/19/2022   ETH <10 11/21/2019   Metabolic Disorder Labs: Lab Results  Component Value Date   HGBA1C 4.4 (L) 12/21/2022   MPG 79.58 12/21/2022   No results found for: "PROLACTIN" Lab Results  Component Value Date   CHOL 218 (H) 12/21/2022   TRIG 81 12/21/2022   HDL 53 12/21/2022   CHOLHDL 4.1 12/21/2022   VLDL 16 12/21/2022   LDLCALC 149 (H) 12/21/2022   LDLCALC 110 (H) 03/13/2011    Physical Findings:   Musculoskeletal: Strength & Muscle Tone: within normal limits Gait & Station: normal Patient leans: N/A  Psychiatric Specialty Exam:  Presentation  General Appearance:  Lying down in bed, in fetal position, hygiene has improved.  Patient took shower this morning  Eye Contact: Fair  Speech: Slow; Clear and Coherent  Speech Volume: Decreased  Handedness: Right  Mood and Affect  Mood: Anxious and depressed  Affect: Constricted and mood-congruent  Thought Process  Goal-directed  Descriptions of Associations:Intact  Orientation:Oriented to time place and person  Thought Content: Denies SI HI or AVH does not present responding to stimuli. Presentation consistent with moderate intellectual disability  History of Schizophrenia/Schizoaffective disorder:Yes  Duration of Psychotic Symptoms:Greater than six months  Hallucinations:Hallucinations: None    Ideas of Reference:None  Suicidal Thoughts:Suicidal Thoughts: No    Homicidal Thoughts:Homicidal Thoughts: No    Sensorium  Memory: Immediate Poor; Recent Poor; Remote Poor  Judgment: Improving  Insight: Improving  Executive Functions  Concentration: Fair  Attention Span: Fair  Recall: Fair  Fund of Knowledge: Poor  Language: Fair  Psychomotor Activity  Anxious and tremulous    Agricultural engineer; Desire for Improvement; Financial Resources/Insurance; Resilience; Social Support  Sleep  Per staff report patient slept 8 hours  Physical Exam: Physical Exam Vitals and nursing note reviewed.  Constitutional:      General: She is not in acute distress.    Appearance: She is not ill-appearing or diaphoretic.  HENT:     Head: Normocephalic and atraumatic.     Nose: No congestion.     Mouth/Throat:     Pharynx: Oropharynx is clear.  Eyes:     Pupils: Pupils are equal, round, and reactive to light.  Pulmonary:     Effort: Pulmonary effort is  normal. No respiratory distress.  Genitourinary:    Comments: Deferred Skin:    General: Skin is warm and dry.  Neurological:     General: No focal deficit present.     Mental Status: She is alert.    Blood pressure 110/74, pulse 76, temperature 97.8 F (36.6 C), temperature source Oral, resp. rate 16, height 4\' 11"  (1.499 m), weight 63 kg, SpO2 100 %. Body mass index is 28.05 kg/m.  Treatment Plan Summary: Daily contact with patient to assess and evaluate symptoms and progress in treatment and Medication management.   Still waiting on safe disposition as patient would not be able to live independently given cognitive impairment. Recommend assisted living.   No medication side effects.  BP normalized, likely 2/2 machine error over the past day.  Because of urinary incontinence, recommend adult diapers.  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  Plan:  -Continue Buspar 15 mg po bid for anxiety.  -Continue paliperidone 3 mg po daily for mood control -Continued Sertraline 100 mg po Q daily for depression -Continue Trazodone 50 mg po Q bedtime prn for insomnia -Continue Nicoderm topically Q 24 hrs for nicotine withdrawal management -Continue vitamin B12 1000 mcg IM weekly for 4 weeks then monthly -Continue on Bactrim for UTI   Safety and Monitoring: Voluntary admission to inpatient psychiatric unit for safety, stabilization and treatment Daily contact with patient to assess and evaluate symptoms and progress in treatment Patient's case to be discussed in multi-disciplinary team meeting Observation Level : q15 minute checks Vital signs: q12 hours Precautions: Safety   Discharge Planning: Social work and case  management to assist with discharge planning and identification of hospital follow-up needs prior to discharge Estimated LOS: Unknown at this time. Discharge Concerns: Need to establish a safety plan; Medication compliance and effectiveness Discharge Goals: Return home with outpatient referrals for mental health follow-up including medication management/psychotherapy No legal guardian, has payee  Still waiting on safe disposition as patient would not be able to live independently given cognitive impairment. Recommend assisted living.    Lewanda Rife, MD.

## 2023-01-11 NOTE — Progress Notes (Signed)
   01/11/23 0940  Psych Admission Type (Psych Patients Only)  Admission Status Voluntary  Psychosocial Assessment  Patient Complaints Anxiety;Restlessness;Sadness  Eye Contact Fair  Facial Expression Flat;Sad;Worried  Affect Depressed  Speech Slow  Interaction Guarded;Minimal;Isolative  Motor Activity Slow  Appearance/Hygiene Disheveled;Body odor  Behavior Characteristics Unwilling to participate  Mood Depressed  Aggressive Behavior  Effect No apparent injury  Thought Process  Coherency Concrete thinking  Content Blaming self  Delusions None reported or observed  Perception WDL  Hallucination None reported or observed  Judgment Poor  Confusion None  Danger to Self  Current suicidal ideation? Denies  Self-Injurious Behavior No self-injurious ideation or behavior indicators observed or expressed   Agreement Not to Harm Self Yes  Description of Agreement Verbal contract  Danger to Others  Danger to Others None reported or observed   Reports her anxiety and depression both 10/10 with stressor being "I'm not reading my Bible enough". Observed with flat affect, fair eye contact, slurred, logical speech. Pt did not attend scheduled groups despite multiple prompts. Remains medication compliant without adverse drug reactions. Support and reassurance offered. Safety checks maintained at Q 15 minutes intervals without incident. All medications administered with verbal education and effects monitored.  Pt tolerated meals and fluids well. Denies concerns at this time.

## 2023-01-11 NOTE — Group Note (Signed)
Recreation Therapy Group Note   Group Topic:Team Building  Group Date: 01/11/2023 Start Time: 0932 End Time: 1000 Facilitators: [name] Mastel-McCall, LRT,CTRS Location: [address] Dayroom   Goal Area(s) Addresses:  Patient will effectively work with peer towards shared goal.  Patient will identify skills used to make activity successful.  Patient will identify how skills used during activity can be used to reach post d/c goals.   Group Description: Straw Bridge. In teams of 3-5, patients were given 15 plastic drinking straws and an equal length of masking tape. Using the materials provided, patients were instructed to build a free standing bridge-like structure to suspend an everyday item (ex: puzzle box) off of the floor or table surface. All materials were required to be used by the team in their design. LRT facilitated post-activity discussion reviewing team process. Patients were encouraged to reflect how the skills used in this activity can be generalized to daily life post discharge.    Affect/Mood: N/A   Participation Level: Did not attend    Clinical Observations/Individualized Feedback:     Plan: Continue to engage patient in RT group sessions 2-3x/week.   Horrace Hanak-McCall, LRT,CTRS  01/11/2023 11:57 AM

## 2023-01-11 NOTE — Plan of Care (Signed)
  Problem: Education: Goal: Utilization of techniques to improve thought processes will improve Outcome: Progressing   Problem: Education: Goal: Knowledge of the prescribed therapeutic regimen will improve Outcome: Progressing   Problem: Coping: Goal: Coping ability will improve Outcome: Progressing   Problem: Safety: Goal: Ability to disclose and discuss suicidal ideas will improve Outcome: Progressing

## 2023-01-11 NOTE — Progress Notes (Signed)
   01/11/23 0629  15 Minute Checks  Location Bedroom  Visual Appearance Calm  Behavior Sleeping  Sleep (Behavioral Health Patients Only)  Calculate sleep? (Click Yes once per 24 hr at 0600 safety check) Yes  Documented sleep last 24 hours 8

## 2023-01-12 DIAGNOSIS — F251 Schizoaffective disorder, depressive type: Secondary | ICD-10-CM | POA: Diagnosis not present

## 2023-01-12 MED ORDER — MELATONIN 5 MG PO TABS
5.0000 mg | ORAL_TABLET | Freq: Every day | ORAL | Status: DC
  Administered 2023-01-12 – 2023-08-08 (×207): 5 mg via ORAL
  Filled 2023-01-12 (×96): qty 1
  Filled 2023-01-12: qty 7
  Filled 2023-01-12 (×125): qty 1

## 2023-01-12 MED ORDER — ENSURE ENLIVE PO LIQD
237.0000 mL | Freq: Three times a day (TID) | ORAL | Status: DC
  Administered 2023-01-12 – 2023-06-24 (×382): 237 mL via ORAL
  Filled 2023-01-12 (×495): qty 237

## 2023-01-12 NOTE — Group Note (Signed)
Date:  01/12/2023 Time:  10:05 PM  Group Topic/Focus:  Wrap-Up Group:   The focus of this group is to help patients review their daily goal of treatment and discuss progress on daily workbooks.  Pt did not attend group. Anmol Paschen J Greydon Betke 01/12/2023, 10:05 PM  

## 2023-01-12 NOTE — Progress Notes (Signed)
Pt is alert and oriented to person, place and day.  Pt has flat sad affect and depressed mood.  Pt has remained in her room except for medications.  Pt did not go to breakfast or lunch. A lunch was brought for her and she was encouraged to eat and drink.  Pt tearful at times when asked if she is feeling Suicidal.  Pt did deny any thoughts of SI or HI.  When asked if she is having AH pt whispered " sometimes".   Pt refused to elaborate.  She did start on a nutritional supplement and has been taking medications without reluctance.   Q checks are in place and pt is being monitored for safety.

## 2023-01-12 NOTE — Group Note (Signed)
Date:  01/12/2023 Time:  2:36 PM  Group Topic/Focus:  Building Self Esteem:   The Focus of this group is helping patients become aware of the effects of self-esteem on their lives, the things they and others do that enhance or undermine their self-esteem, seeing the relationship between their level of self-esteem and the choices they make and learning ways to enhance self-esteem.    Participation Level:  Did Not Attend  Participation Quality:      Affect:      Cognitive:      Insight: None  Engagement in Group:    Modes of Intervention:  Activity  Additional Comments:     [name] 01/12/2023, 2:36 PM

## 2023-01-12 NOTE — Group Note (Signed)
Date:  01/12/2023 Time:  12:04 PM  Group Topic/Focus:  Goals Group:   The focus of this group is to help patients establish daily goals to achieve during treatment and discuss how the patient can incorporate goal setting into their daily lives to aide in recovery. Orientation:   The focus of this group is to educate the patient on the purpose and policies of crisis stabilization and provide a format to answer questions about their admission.  The group details unit policies and expectations of patients while admitted.    Participation Level:  Did Not Attend  Participation Quality:   Did not attend  Affect:   Did not attend  Cognitive:   did not attend  Insight: None  Engagement in Group:   Did not attend  Modes of Intervention:   did not attend  Additional Comments:  Did not attend  Gwinda Maine 01/12/2023, 12:04 PM

## 2023-01-12 NOTE — Progress Notes (Signed)
   01/12/23 2050  Psych Admission Type (Psych Patients Only)  Admission Status Voluntary  Psychosocial Assessment  Patient Complaints Isolation  Eye Contact Fair  Facial Expression Animated  Affect Appropriate to circumstance  Speech Slow;Soft  Interaction Assertive  Motor Activity Slow  Appearance/Hygiene Unremarkable  Behavior Characteristics Cooperative;Appropriate to situation  Mood Depressed  Thought Process  Coherency WDL  Content WDL  Delusions None reported or observed  Perception WDL  Hallucination None reported or observed  Judgment Poor  Confusion None  Danger to Self  Current suicidal ideation? Denies  Self-Injurious Behavior No self-injurious ideation or behavior indicators observed or expressed   Agreement Not to Harm Self Yes  Description of Agreement verbal  Danger to Others  Danger to Others None reported or observed

## 2023-01-12 NOTE — Group Note (Unsigned)
Date:  01/12/2023 Time:  2:16 PM  Group Topic/Focus:  Building Self Esteem:   The Focus of this group is helping patients become aware of the effects of self-esteem on their lives, the things they and others do that enhance or undermine their self-esteem, seeing the relationship between their level of self-esteem and the choices they make and learning ways to enhance self-esteem.     Participation Level:  {BHH PARTICIPATION EAVWU:98119}  Participation Quality:  {BHH PARTICIPATION QUALITY:22265}  Affect:  {BHH AFFECT:22266}  Cognitive:  {BHH COGNITIVE:22267}  Insight: {BHH Insight2:20797}  Engagement in Group:  {BHH ENGAGEMENT IN JYNWG:95621}  Modes of Intervention:  {BHH MODES OF INTERVENTION:22269}  Additional Comments:  ***  Yolanda [name] 01/12/2023, 2:16 PM

## 2023-01-12 NOTE — Group Note (Deleted)
LCSW Group Therapy Note   Group Date: 01/12/2023 Start Time: 1000 End Time: 1100   Type of Therapy and Topic:  Group Therapy: Challenging Core Beliefs  Participation Level:  {BHH PARTICIPATION LEVEL:22264}  Description of Group:  Patients were educated about core beliefs and asked to identify one harmful core belief that they have. Patients were asked to explore from where those beliefs originate. Patients were asked to discuss how those beliefs make them feel and the resulting behaviors of those beliefs. They were then be asked if those beliefs are true and, if so, what evidence they have to support them. Lastly, group members were challenged to replace those negative core beliefs with helpful beliefs.   Therapeutic Goals:   1. Patient will identify harmful core beliefs and explore the origins of such beliefs. 2. Patient will identify feelings and behaviors that result from those core beliefs. 3. Patient will discuss whether such beliefs are true. 4.  Patient will replace harmful core beliefs with helpful ones.  Summary of Patient Progress:  *** actively engaged in processing and exploring how core beliefs are formed and how they impact thoughts, feelings, and behaviors. Patient proved open to input from peers and feedback from CSW. Patient demonstrated *** insight into the subject matter, was respectful and supportive of peers, and participated throughout the entire session.  Therapeutic Modalities: Cognitive Behavioral Therapy; Solution-Focused Therapy   Makinzey Banes A Ankur Snowdon, LCSWA 01/12/2023  3:27 PM   

## 2023-01-12 NOTE — Progress Notes (Signed)
Patient ID: Yolanda [name], female   DOB: [birth year], 47 y.o.   MRN: [id number] [facility] MD Progress Note  01/12/2023 1:15 PM Yolanda [name]  MRN:  [id number]  Reason for admission: This is the first psychiatric admission in this [facility] in 12 years for this 3 AA female with an extensive hx of mental illnesses & probable polysubstance use disorders. She is admitted to the [facility] from the [name] with complain of worsening suicidal ideations with plan to stab herself. Per chart review, patient apparently reported at the ED that she has been depressed for a while & has not been taking her mental health medications. After medical evaluation.clearance, she was transferred to the [name] for further psychiatric evaluation/treatments.  APS report on 6/24 Has payee, no legal guardian  Overnight events:  Staff reports that pt is continuing tom stay isolative to her room, not coming out for group activities, and mood is remaining very depressed.  Blood pressure earlier today morning is 88/66, patient's assigned RN notified to recheck vitals.  No PRN meds given last night.  Patient is compliant with scheduled medications, received hydroxyzine 50 mg last night.  Patient assessment note:  Pt with flat affect and depressed mood, attention to personal hygiene and grooming is poor, eye contact is good, speech is clear & coherent. Thought contents are organized and logical, and pt currently denies HI/AVH or paranoia. There is no evidence of delusional thoughts.    Patient presents today with suicidal ideations, denies having a current plan, is able to verbally contract for safety on the unit.  She rates her depression as a 10, 10 being worst.  She rates anxiety as a 6, 10 being worst.  Patient reports being depressed mostly due to still being hospitalized.  Still waiting on safe disposition as patient would not be able to live independently given cognitive impairment. Assisted living is currently being recommended  by her treatment team.  Patient appears disheveled, and they need to tend to personal hygiene and grooming reiterated.  She verbalizes having no motivation or interest to attend to her personal hygiene needs.  Patient educated to seek out staff assistance.  Nursing staff also made aware to assist patient with personal hygiene needs.  Patient educated on the need to drink fluids in an effort to stay hydrated, due to low blood pressures earlier today morning.  She reports a poor sleep quality last night, but we are holding of increasing the dose of her trazodone as this might further drop her blood pressures in the morning.  Discontinuing hydroxyzine 50 mg at nighttime, and starting melatonin 5 mg nightly due to low blood pressures in the morning.  Starting Ensure nutritional shakes in between meals, as patient is reporting a poor appetite and not eating well.  We will continue to monitor, but patient remains in need  of continuous hospitalization for the treatment and stabilization of her mental status.  Principal Problem: Schizoaffective disorder, depressive type (HCC)   Diagnosis: Principal Problem:   Schizoaffective disorder, depressive type (HCC) Active Problems:   GERD (gastroesophageal reflux disease)   Intellectual disability   Tobacco use disorder   Past Psychiatric History: See H&P.  Past Medical History:  Past Medical History:  Diagnosis Date   Bipolar affect, depressed (HCC)    Constipation 08/17/2022   Depression    Falls 07/21/2022   Fracture of femoral neck, right, closed (HCC) 01/17/2022   Herpes simplex 08/22/2017   Open fracture dislocation of right elbow joint 01/17/2022  Past Surgical History:  Procedure Laterality Date   NO PAST SURGERIES     SALPINGECTOMY     Family History: History reviewed. No pertinent family history.  Family Psychiatric  History: See H&P.  Social History:  Social History   Substance and Sexual Activity  Alcohol Use Yes     Social  History   Substance and Sexual Activity  Drug Use Yes   Types: Cocaine, Marijuana    Social History   Socioeconomic History   Marital status: Single    Spouse name: Not on file   Number of children: Not on file   Years of education: Not on file   Highest education level: Not on file  Occupational History   Not on file  Tobacco Use   Smoking status: Every Day   Smokeless tobacco: Not on file  Substance and Sexual Activity   Alcohol use: Yes   Drug use: Yes    Types: Cocaine, Marijuana   Sexual activity: Yes  Other Topics Concern   Not on file  Social History Narrative   Not on file   Social Determinants of Health   Financial Resource Strain: Not on file  Food Insecurity: Patient Declined (12/20/2022)   Hunger Vital Sign    Worried About Running Out of Food in the Last Year: Patient declined    Ran Out of Food in the Last Year: Patient declined  Transportation Needs: No Transportation Needs (12/20/2022)   PRAPARE - Administrator, Civil Service (Medical): No    Lack of Transportation (Non-Medical): No  Physical Activity: Not on file  Stress: Not on file  Social Connections: Not on file   Current Medications: Current Facility-Administered Medications  Medication Dose Route Frequency Provider Last Rate Last Admin   acetaminophen (TYLENOL) tablet 650 mg  650 mg Oral Q6H PRN [name], NP   650 mg at 12/29/22 1600   alum & mag hydroxide-simeth (MAALOX/MYLANTA) 200-200-20 MG/5ML suspension 30 mL  30 mL Oral Q4H PRN [name], NP       busPIRone (BUSPAR) tablet 15 mg  15 mg Oral BID [name], NP   15 mg at 01/12/23 0981   cyanocobalamin (VITAMIN B12) injection 1,000 mcg  1,000 mcg Intramuscular Weekly [name], Nadir, MD   1,000 mcg at 01/12/23 1206   Followed by   Melene Muller ON 02/02/2023] cyanocobalamin (VITAMIN B12) injection 1,000 mcg  1,000 mcg Intramuscular Q30 days [name], Nadir, MD       diphenhydrAMINE (BENADRYL) capsule 50 mg  50 mg Oral TID PRN  [name], NP       Or   diphenhydrAMINE (BENADRYL) injection 50 mg  50 mg Intramuscular TID PRN [name], NP       feeding supplement (ENSURE ENLIVE / ENSURE PLUS) liquid 237 mL  237 mL Oral TID BM Jilliam Bellmore, NP       haloperidol (HALDOL) tablet 5 mg  5 mg Oral TID PRN [name], NP       Or   haloperidol lactate (HALDOL) injection 5 mg  5 mg Intramuscular TID PRN [name], NP       hydrOXYzine (ATARAX) tablet 25 mg  25 mg Oral TID PRN [name], DO   25 mg at 01/09/23 2153   LORazepam (ATIVAN) tablet 2 mg  2 mg Oral TID PRN [name], NP       Or   LORazepam (ATIVAN) injection 2 mg  2 mg Intramuscular TID PRN Mayford Knife,  Channing Mutters, NP       magnesium hydroxide (MILK OF MAGNESIA) suspension 30 mL  30 mL Oral Daily PRN [name], NP       melatonin tablet 5 mg  5 mg Oral QHS Ruchi Stoney, NP       nicotine (NICODERM CQ - dosed in mg/24 hours) patch 21 mg  21 mg Transdermal Daily [name], DO   21 mg at 01/12/23 0839   paliperidone (INVEGA) 24 hr tablet 3 mg  3 mg Oral Daily [name], NP   3 mg at 01/12/23 0837   pantoprazole (PROTONIX) EC tablet 40 mg  40 mg Oral Daily [name], NP   40 mg at 01/12/23 0837   polyethylene glycol (MIRALAX / GLYCOLAX) packet 17 g  17 g Oral BID [name], DO   17 g at 01/12/23 1610   sertraline (ZOLOFT) tablet 100 mg  100 mg Oral Daily Park Pope, MD   100 mg at 01/12/23 0840   sulfamethoxazole-trimethoprim (BACTRIM DS) 800-160 MG per tablet 1 tablet  1 tablet Oral Q12H [name], NP   1 tablet at 01/12/23 0837   traZODone (DESYREL) tablet 50 mg  50 mg Oral QHS [name], NP   50 mg at 01/11/23 2029   Lab Results:  No results found for this or any previous visit (from the past 48 hour(s)).   Blood Alcohol level:  Lab Results  Component Value Date   ETH <10 12/19/2022   ETH <10 11/21/2019   Metabolic Disorder Labs: Lab Results  Component Value Date   HGBA1C 4.4 (L) 12/21/2022   MPG 79.58  12/21/2022   No results found for: "PROLACTIN" Lab Results  Component Value Date   CHOL 218 (H) 12/21/2022   TRIG 81 12/21/2022   HDL 53 12/21/2022   CHOLHDL 4.1 12/21/2022   VLDL 16 12/21/2022   LDLCALC 149 (H) 12/21/2022   LDLCALC 110 (H) 03/13/2011   Physical Findings: AIMS: Facial and Oral Movements Muscles of Facial Expression: None, normal Lips and Perioral Area: None, normal Jaw: None, normal Tongue: None, normal,Extremity Movements Upper (arms, wrists, hands, fingers): None, normal Lower (legs, knees, ankles, toes): None, normal, Trunk Movements Neck, shoulders, hips: None, normal, Overall Severity Severity of abnormal movements (highest score from questions above): None, normal Incapacitation due to abnormal movements: None, normal Patient's awareness of abnormal movements (rate only patient's report): No Awareness, Dental Status Current problems with teeth and/or dentures?: No Does patient usually wear dentures?: No   Musculoskeletal: Strength & Muscle Tone: within normal limits Gait & Station: normal Patient leans: N/A  Psychiatric Specialty Exam:  Presentation  General Appearance:  Lying down in bed, better hygiene noted  Eye Contact: Fair Speech: Clear and Coherent  Speech Volume: Normal  Handedness: Right  Mood and Affect  Mood: Depressed; Anxious  Affect: Congruent  Thought Process  Thought Processes: Coherent  Descriptions of Associations:Intact  Orientation:Partial  Thought Content: Denies SI HI or AVH does not present responding to stimuli. Presentation consistent with moderate intellectual disability  History of Schizophrenia/Schizoaffective disorder:No  Duration of Psychotic Symptoms:N/A  Hallucinations:Hallucinations: None     Ideas of Reference:None  Suicidal Thoughts:Suicidal Thoughts: Yes, Active SI Active Intent and/or Plan: Without Intent; Without Plan     Homicidal Thoughts:Homicidal Thoughts:  No     Sensorium  Memory: Immediate Good  Judgment: Limited Insight: Limited Executive Functions  Concentration: Good  Attention Span: Good  Recall: Fair  Fund of Knowledge: Poor  Language: Fair  Psychomotor Activity  Psychomotor Activity: Psychomotor Activity: Normal     Assets  Assets: Resilience  Sleep  Sleep: Sleep: Poor    Physical Exam: Physical Exam Vitals and nursing note reviewed.  Constitutional:      General: She is not in acute distress.    Appearance: She is not ill-appearing or diaphoretic.  HENT:     Head: Normocephalic.     Nose: No congestion.     Mouth/Throat:     Pharynx: Oropharynx is clear.  Eyes:     Pupils: Pupils are equal, round, and reactive to light.  Pulmonary:     Effort: Pulmonary effort is normal. No respiratory distress.  Genitourinary:    Comments: Deferred Musculoskeletal:        General: Normal range of motion.     Cervical back: Normal range of motion.  Skin:    General: Skin is warm and dry.  Neurological:     General: No focal deficit present.     Mental Status: She is alert.    Blood pressure (!) 88/66, pulse 84, temperature 97.8 F (36.6 C), temperature source Oral, resp. rate 16, height 4\' 11"  (1.499 m), weight 63 kg, SpO2 96 %. Body mass index is 28.05 kg/m.  Treatment Plan Summary: Daily contact with patient to assess and evaluate symptoms and progress in treatment and Medication management.   Still waiting on safe disposition as patient would not be able to live independently given cognitive impairment. Recommend assisted living.   No medication side effects.  BP normalized, likely 2/2 machine error over the past day.  Because of urinary incontinence, recommend adult diapers.  Principal/active diagnoses:  Principal Problem:   Schizoaffective disorder, depressive type (HCC) Active Problems:   GERD (gastroesophageal reflux disease)   Intellectual disability   Tobacco use  disorder (MOCA 16/30) moderate cognitive impairment probably related to moderate intellectual disability.  Plan:  -Start Melatonin 5 mg nightly for sleep -Start Ensure nutritional shakes TID in between meals  -Discontinue Hydroxyzine 50 mg nightly-hypotension in the morning s -Continue Buspar 15 mg po bid for anxiety.  -Continue paliperidone 3 mg po daily for mood control -Continued Sertraline 100 mg po Q daily for depression -Continue Trazodone 50 mg po Q bedtime prn for insomnia -Continue Nicoderm topically Q 24 hrs for nicotine withdrawal management -Continue vitamin B12 1000 mcg IM weekly for 4 weeks then monthly  Cont to encourage wearing diapers Lab work and EKG, reviewed with no significant abnormalities noted, exc ept slightly low b12 166.  Safety and Monitoring: Voluntary admission to inpatient psychiatric unit for safety, stabilization and treatment Daily contact with patient to assess and evaluate symptoms and progress in treatment Patient's case to be discussed in multi-disciplinary team meeting Observation Level : q15 minute checks Vital signs: q12 hours Precautions: Safety   Discharge Planning: Social work and case management to assist with discharge planning and identification of [name] follow-up needs prior to discharge Estimated LOS: Unknown at this time. Discharge Concerns: Need to establish a safety plan; Medication compliance and effectiveness Discharge Goals: Return home with outpatient referrals for mental health follow-up including medication management/psychotherapy No legal guardian, has payee  [name], NP, pmhnp,. 01/12/2023, 1:15 PMPatient ID: Yolanda [name], female   DOB: 1976-04-27, 47 y.o.   MRN: [id number] Patient ID: [name], female   DOB: March 14, 1976, 47 y.o.   MRN: [id number] Patient ID: Y[name], female   DOB: 03/20/1976, 47 y.o.   MRN: [id number] Patient ID: Y[name], female   DOB: 09-06-1975, 47 y.o.   MRN:  [id number]

## 2023-01-12 NOTE — Progress Notes (Signed)
   01/11/23 2100  Psych Admission Type (Psych Patients Only)  Admission Status Voluntary  Psychosocial Assessment  Patient Complaints Isolation  Eye Contact Fair  Facial Expression Animated  Affect Appropriate to circumstance  Speech Slow;Soft  Interaction Assertive  Motor Activity Slow  Appearance/Hygiene Unremarkable  Behavior Characteristics Cooperative;Appropriate to situation  Mood Pleasant  Thought Process  Coherency WDL  Content WDL  Delusions None reported or observed  Perception WDL  Hallucination None reported or observed  Judgment Poor  Confusion None  Danger to Self  Current suicidal ideation? Denies  Self-Injurious Behavior No self-injurious ideation or behavior indicators observed or expressed   Agreement Not to Harm Self Yes  Description of Agreement verbal  Danger to Others  Danger to Others None reported or observed

## 2023-01-13 DIAGNOSIS — F251 Schizoaffective disorder, depressive type: Secondary | ICD-10-CM | POA: Diagnosis not present

## 2023-01-13 LAB — URINALYSIS, ROUTINE W REFLEX MICROSCOPIC
Bilirubin Urine: NEGATIVE
Glucose, UA: NEGATIVE mg/dL
Hgb urine dipstick: NEGATIVE
Ketones, ur: NEGATIVE mg/dL
Nitrite: NEGATIVE
Protein, ur: NEGATIVE mg/dL
Specific Gravity, Urine: 1.024 (ref 1.005–1.030)
pH: 5 (ref 5.0–8.0)

## 2023-01-13 NOTE — Progress Notes (Signed)
   01/13/23 2208  Psych Admission Type (Psych Patients Only)  Admission Status Voluntary  Psychosocial Assessment  Patient Complaints Depression  Eye Contact Brief  Facial Expression Animated  Affect Appropriate to circumstance  Speech Logical/coherent  Interaction Childlike  Motor Activity Slow  Appearance/Hygiene Improved  Behavior Characteristics Appropriate to situation  Mood Pleasant;Depressed  Thought Process  Coherency WDL  Content WDL  Delusions None reported or observed  Perception WDL  Hallucination None reported or observed  Judgment Limited  Confusion None  Danger to Self  Current suicidal ideation? Denies  Self-Injurious Behavior No self-injurious ideation or behavior indicators observed or expressed   Agreement Not to Harm Self Yes  Description of Agreement verbal  Danger to Others  Danger to Others None reported or observed

## 2023-01-13 NOTE — [facility] Group Notes (Signed)
[facility] Group Notes:  (Nursing/MHT/Case Management/Adjunct)  Date:  01/13/2023  Time:  8:54 PM  Type of Therapy:   Wrap-up group  Participation Level:  Active  Participation Quality:  Appropriate  Affect:  Appropriate  Cognitive:  Appropriate  Insight:  Appropriate  Engagement in Group:  Engaged  Modes of Intervention:  Education  Summary of Progress/Problems: Pt goal to stay out of her room and attend groups, Day 9/10.  Noah Delaine 01/13/2023, 8:54 PM

## 2023-01-13 NOTE — Progress Notes (Signed)
Patient ID: Yolanda [name], female   DOB: Sep 10, 1975, 47 y.o.   MRN: [id number] [name] MD Progress Note  01/13/2023 12:15 PM [name]  MRN:  [id number]  Reason for admission: This is the first psychiatric admission in this [name] in 12 years for this 47 AA female with an extensive hx of mental illnesses & probable polysubstance use disorders. She is [facility] to the [name] with complain of worsening suicidal ideations with plan to stab herself. Per chart review, patient apparently reported at the ED that she has been depressed for a while & has not been taking her mental health medications. After medical evaluation.clearance, she was transferred to the [name] for further psychiatric evaluation/treatments.  APS report on 6/24 Has payee, no legal guardian Continues to await placement  Overnight events:  Continued to stay isolative in her room yesterday into today morning as per staff's reports. HR recorded as 131 earlier today morning. [name] assigned RN asked to recheck pulse rate. Has not been attending unit group activities, is compliant with medications, no PRN medications administered overnight.  Patient assessment note:  Pt continues to present with a flat affect and depressed mood, attention to personal hygiene and grooming is still poor, but slightly better as compared to yesterday. Her eye contact is good, speech is clear & coherent. Thought contents are organized and logical, and pt currently denies SI/HI/AVH or paranoia. There is no evidence of delusional thoughts.    Pt is however continuing to rate depression as 10, 10 being worst, and does not state exactly what is causing her depressed mood (stressors). Pt reports feeling tired today even though she states that she slept last night. Pt states that she was able to take a shower yesterday, continues to present disheveled. Reports that appetite is improving, denies any issues with bowel movements, states he had one  yesterday, denies being in any physical pain.   A safe discharge disposition continues to be a factor  which is negatively impacting on discharging patient. She has cognitive impairment which renders her unlikely to be able to function well in an independent living arrangement. She is in need of a structured environment prior to discharge. CSW is continuing to work on coordinating this, and a CPS report has been Building services engineer. Continuing medications as listed below with no changes today.   UA with nitrites and small leukocytes on 07/3, rechecking UA. Pt is asymptomatic.   Principal Problem: Schizoaffective disorder, depressive type (HCC)   Diagnosis: Principal Problem:   Schizoaffective disorder, depressive type (HCC) Active Problems:   GERD (gastroesophageal reflux disease)   Intellectual disability   Tobacco use disorder   Past Psychiatric History: See H&P.  Past Medical History:  Past Medical History:  Diagnosis Date   Bipolar affect, depressed (HCC)    Constipation 08/17/2022   Depression    Falls 07/21/2022   Fracture of femoral neck, right, closed (HCC) 01/17/2022   Herpes simplex 08/22/2017   Open fracture dislocation of right elbow joint 01/17/2022    Past Surgical History:  Procedure Laterality Date   NO PAST SURGERIES     SALPINGECTOMY     Family History: History reviewed. No pertinent family history.  Family Psychiatric  History: See H&P.  Social History:  Social History   Substance and Sexual Activity  Alcohol Use Yes     Social History   Substance and Sexual Activity  Drug Use Yes   Types: Cocaine, Marijuana    Social History  Socioeconomic History   Marital status: Single    Spouse name: Not on file   Number of children: Not on file   Years of education: Not on file   Highest education level: Not on file  Occupational History   Not on file  Tobacco Use   Smoking status: Every Day   Smokeless tobacco: Not on file  Substance and Sexual Activity    Alcohol use: Yes   Drug use: Yes    Types: Cocaine, Marijuana   Sexual activity: Yes  Other Topics Concern   Not on file  Social History Narrative   Not on file   Social Determinants of Health   Financial Resource Strain: Not on file  Food Insecurity: Patient Declined (12/20/2022)   Hunger Vital Sign    Worried About Running Out of Food in the Last Year: Patient declined    Ran Out of Food in the Last Year: Patient declined  Transportation Needs: No Transportation Needs (12/20/2022)   PRAPARE - Administrator, Civil Service (Medical): No    Lack of Transportation (Non-Medical): No  Physical Activity: Not on file  Stress: Not on file  Social Connections: Not on file   Current Medications: Current Facility-Administered Medications  Medication Dose Route Frequency Provider Last Rate Last Admin   acetaminophen (TYLENOL) tablet 650 mg  650 mg Oral Q6H PRN [name], NP   650 mg at 12/29/22 1600   alum & mag hydroxide-simeth (MAALOX/MYLANTA) 200-200-20 MG/5ML suspension 30 mL  30 mL Oral Q4H PRN [name], NP       busPIRone (BUSPAR) tablet 15 mg  15 mg Oral BID [name], NP   15 mg at 01/13/23 1610   cyanocobalamin (VITAMIN B12) injection 1,000 mcg  1,000 mcg Intramuscular Weekly [name], Nadir, MD   1,000 mcg at 01/12/23 1206   Followed by   Melene Muller ON 02/02/2023] cyanocobalamin (VITAMIN B12) injection 1,000 mcg  1,000 mcg Intramuscular Q30 days [name], Nadir, MD       diphenhydrAMINE (BENADRYL) capsule 50 mg  50 mg Oral TID PRN [name], NP       Or   diphenhydrAMINE (BENADRYL) injection 50 mg  50 mg Intramuscular TID PRN [name], NP       feeding supplement (ENSURE ENLIVE / ENSURE PLUS) liquid 237 mL  237 mL Oral TID BM Sadiel Mota, NP   237 mL at 01/13/23 0934   haloperidol (HALDOL) tablet 5 mg  5 mg Oral TID PRN [name], NP       Or   haloperidol lactate (HALDOL) injection 5 mg  5 mg Intramuscular TID PRN [name], NP        hydrOXYzine (ATARAX) tablet 25 mg  25 mg Oral TID PRN [name], DO   25 mg at 01/09/23 2153   LORazepam (ATIVAN) tablet 2 mg  2 mg Oral TID PRN [name], NP       Or   LORazepam (ATIVAN) injection 2 mg  2 mg Intramuscular TID PRN [name], NP       magnesium hydroxide (MILK OF MAGNESIA) suspension 30 mL  30 mL Oral Daily PRN [name], NP       melatonin tablet 5 mg  5 mg Oral QHS Tytiana Coles, NP   5 mg at 01/12/23 2138   nicotine (NICODERM CQ - dosed in mg/24 hours) patch 21 mg  21 mg Transdermal Daily [name], DO   21 mg at 01/12/23  4098   paliperidone (INVEGA) 24 hr tablet 3 mg  3 mg Oral Daily Nwoko, Agnes I, NP   3 mg at 01/13/23 0838   pantoprazole (PROTONIX) EC tablet 40 mg  40 mg Oral Daily [name], NP   40 mg at 01/13/23 0838   polyethylene glycol (MIRALAX / GLYCOLAX) packet 17 g  17 g Oral BID [name], DO   17 g at 01/13/23 0840   sertraline (ZOLOFT) tablet 100 mg  100 mg Oral Daily Park Pope, MD   100 mg at 01/13/23 0839   sulfamethoxazole-trimethoprim (BACTRIM DS) 800-160 MG per tablet 1 tablet  1 tablet Oral Q12H [name], NP   1 tablet at 01/13/23 1191   traZODone (DESYREL) tablet 50 mg  50 mg Oral QHS [name], NP   50 mg at 01/12/23 2138   Lab Results:  No results found for this or any previous visit (from the past 48 hour(s)).  Blood Alcohol level:  Lab Results  Component Value Date   ETH <10 12/19/2022   ETH <10 11/21/2019   Metabolic Disorder Labs: Lab Results  Component Value Date   HGBA1C 4.4 (L) 12/21/2022   MPG 79.58 12/21/2022   No results found for: "PROLACTIN" Lab Results  Component Value Date   CHOL 218 (H) 12/21/2022   TRIG 81 12/21/2022   HDL 53 12/21/2022   CHOLHDL 4.1 12/21/2022   VLDL 16 12/21/2022   LDLCALC 149 (H) 12/21/2022   LDLCALC 110 (H) 03/13/2011   Physical Findings: AIMS: Facial and Oral Movements Muscles of Facial Expression: None, normal Lips and Perioral Area: None,  normal Jaw: None, normal Tongue: None, normal,Extremity Movements Upper (arms, wrists, hands, fingers): None, normal Lower (legs, knees, ankles, toes): None, normal, Trunk Movements Neck, shoulders, hips: None, normal, Overall Severity Severity of abnormal movements (highest score from questions above): None, normal Incapacitation due to abnormal movements: None, normal Patient's awareness of abnormal movements (rate only patient's report): No Awareness, Dental Status Current problems with teeth and/or dentures?: No Does patient usually wear dentures?: No   Musculoskeletal: Strength & Muscle Tone: within normal limits Gait & Station: normal Patient leans: N/A  Psychiatric Specialty Exam:  Presentation  General Appearance:  Lying down in bed, better hygiene noted  Eye Contact: Fair Speech: Clear and Coherent  Speech Volume: Normal  Handedness: Right  Mood and Affect  Mood: Depressed  Affect: Congruent  Thought Process  Thought Processes: Coherent  Descriptions of Associations:Intact  Orientation:Partial  Thought Content: Denies SI HI or AVH does not present responding to stimuli. Presentation consistent with moderate intellectual disability  History of Schizophrenia/Schizoaffective disorder:No  Duration of Psychotic Symptoms:Greater than six months  Hallucinations:Hallucinations: Auditory Description of Auditory Hallucinations: admitted to [name] of whispers to her nurse earlier today moring  Ideas of Reference:None  Suicidal Thoughts:Suicidal Thoughts: No SI Active Intent and/or Plan: Without Intent; Without Plan  Homicidal Thoughts:Homicidal Thoughts: No  Sensorium  Memory: Immediate Good  Judgment: Limited Insight: Limited Executive Functions  Concentration: Poor  Attention Span: Poor  Recall: Poor  Fund of Knowledge: Poor  Language: Fair  Psychomotor Activity  Psychomotor Activity: Psychomotor Activity: Normal  Assets   Assets: Communication Skills; Resilience  Sleep  Sleep: Sleep: Good  Physical Exam: Physical Exam Vitals and nursing note reviewed.  Constitutional:      General: She is not in acute distress.    Appearance: She is not ill-appearing or diaphoretic.  HENT:     Head: Normocephalic.  Nose: No congestion.     Mouth/Throat:     Pharynx: Oropharynx is clear.  Eyes:     Pupils: Pupils are equal, round, and reactive to light.  Pulmonary:     Effort: Pulmonary effort is normal. No respiratory distress.  Genitourinary:    Comments: Deferred Musculoskeletal:        General: Normal range of motion.     Cervical back: Normal range of motion.  Skin:    General: Skin is warm and dry.  Neurological:     General: No focal deficit present.     Mental Status: She is alert.    Blood pressure 97/73, pulse (!) 131, temperature 97.8 F (36.6 C), temperature source Oral, resp. rate 16, height 4\' 11"  (1.499 m), weight 63 kg, SpO2 100 %. Body mass index is 28.05 kg/m.  Treatment Plan Summary: Daily contact with patient to assess and evaluate symptoms and progress in treatment and Medication management.   Still waiting on safe disposition as patient would not be able to live independently given cognitive impairment. Recommend assisted living.   No medication side effects.  BP normalized, likely 2/2 machine error over the past day.  Because of urinary incontinence, recommend adult diapers.  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  Plan:  -Continue Buspar 15 mg po bid for anxiety.  -Continue paliperidone 3 mg po daily for mood stabilization -Continued Sertraline 100 mg po Q daily for depression/anxiety -Continue Trazodone  50 mg po Q bedtime prn for insomnia -Continue Nicoderm topically Q 24 hrs for nicotine withdrawal management -Continue vitamin B12 1000 mcg IM weekly for 4 weeks then monthly -Continue Melatonin 5 mg nightly for sleep -Continue Ensure nutritional shakes TID in between meals  -Previously discontinued Hydroxyzine 50 mg nightly-hypotension in the mornings  Lab work and EKG, reviewed with no significant abnormalities noted, exc ept slightly low b12 166.  Safety and Monitoring: Voluntary admission to inpatient psychiatric unit for safety, stabilization and treatment Daily contact with patient to assess and evaluate symptoms and progress in treatment Patient's case to be discussed in multi-disciplinary team meeting Observation Level : q15 minute checks Vital signs: q12 hours Precautions: Safety   Discharge Planning: Social work and case management to assist with discharge planning and identification of [name] follow-up needs prior to discharge Estimated LOS: Unknown at this time. Discharge Concerns: Need to establish a safety plan; Medication compliance and effectiveness Discharge Goals: Return home with outpatient referrals for mental health follow-up including medication management/psychotherapy No legal guardian, has payee  Yolanda Blue, NP, pmhnp,. 01/13/2023, 12:15 PMPatient ID: Yolanda [name], female   DOB: 27-Jul-1975, 47 y.o.   MRN: [id number] Patient ID: [name], female   DOB: 1975/08/04

## 2023-01-13 NOTE — [facility] Group Notes (Signed)
Did not attend goals group 

## 2023-01-13 NOTE — Progress Notes (Signed)
Pt returned from dinner . Extremely agitated and crying.  Pt was allowed to go to her room .  Pt was given medications for agitation and MHT was speaking with her to deescalating.

## 2023-01-13 NOTE — Group Note (Signed)
Date:  01/13/2023 Time:  3:09 PM  Group Topic/Focus:   Developing a Wellness Toolbox:   The focus of this group is to help patients develop a "wellness toolbox" with skills and strategies to promote recovery upon discharge. Skills and strategies discussed included eating healthy, exercise, maintaining appropriate physical health, keeping the brain stimulated with reading, puzzles, intellectual conversations, and being social.     Participation Level:  Did Not Attend  Participation Quality:   Did not attend  Affect:   Did not attend  Cognitive:   Did not attend  Insight: None  Engagement in Group:   Did not attend  Modes of Intervention:   Did not attend  Additional Comments:  n/a  Cherre Blanc 01/13/2023, 3:09 PM

## 2023-01-13 NOTE — Plan of Care (Signed)
  Problem: Activity: Goal: Interest or engagement in leisure activities will improve Outcome: Progressing Goal: Imbalance in normal sleep/wake cycle will improve Outcome: Progressing   Problem: Coping: Goal: Coping ability will improve Outcome: Progressing Goal: Will verbalize feelings Outcome: Progressing   

## 2023-01-13 NOTE — Progress Notes (Signed)
Pt alert to person and place.  Pt continues to isolate in room this morning.  Flat blunted affect and depressed mood.   Pt does come out of room with prompting for medication. Pt currently denies SI, HI.  She does endorse " whispering voices"  pt states " they are not as bad as before".  Per Tyler Aas NP, pt is to be encouraged to leave room and this was done.   Pt allowed to dress and was brought into the day room at this time. Staff will cont to monitor q15 for safety.

## 2023-01-14 ENCOUNTER — Encounter (HOSPITAL_COMMUNITY): Payer: Self-pay

## 2023-01-14 DIAGNOSIS — F251 Schizoaffective disorder, depressive type: Secondary | ICD-10-CM | POA: Diagnosis not present

## 2023-01-14 NOTE — Group Note (Signed)
Recreation Therapy Group Note   Group Topic:Team Building  Group Date: 01/14/2023 Start Time: 0930 End Time: 1005 Facilitators: Amayiah Gosnell-McCall, LRT,CTRS Location: [address] Dayroom   Goal Area(s) Addresses:  Patient will effectively work with peer towards shared goal.  Patient will identify skills used to make activity successful.  Patient will identify how skills used during activity can be applied to reach post d/c goals.   Group Description: [employer]. In teams of 5-6, patients were given 11 craft pipe cleaners. Using the materials provided, patients were instructed to compete again the opposing team(s) to build the tallest free-standing structure from floor level. The activity was timed; difficulty increased by Clinical research associate as Production designer, theatre/television/film continued.  Systematically resources were removed with additional directions for example, placing one arm behind their back, working in silence, and shape stipulations. LRT facilitated post-activity discussion reviewing team processes and necessary communication skills involved in completion. Patients were encouraged to reflect how the skills utilized, or not utilized, in this activity can be incorporated to positively impact support systems post discharge.   Affect/Mood: Appropriate   Participation Level: Active   Participation Quality: Independent   Behavior: Appropriate   Speech/Thought Process: Focused   Insight: Fair   Judgement: Fair    Modes of Intervention: STEM Activity   Patient Response to Interventions:  Receptive   Education Outcome:  Acknowledges education   Clinical Observations/Individualized Feedback: Pt came to group a little late but was able to assist one of the groups in constructing a tower.     Plan: Continue to engage patient in RT group sessions 2-3x/week.   Jami Bogdanski-McCall, LRT,CTRS 01/14/2023 11:50 AM

## 2023-01-14 NOTE — Progress Notes (Addendum)
Pt denied SI/HI/AVH this morning. Pt encouraged to stay out of room for groups and meals per NP order. RN provided patient with briefs and instructions on how to use due to complaints of incontinence. Pt did not wish to use briefs at this time. Pt has been pleasant, calm, and cooperative throughout the shift. RN provided support and encouragement to patient. Pt given scheduled medications as prescribed. Q15 min checks verified for safety. Patient verbally contracts for safety. Patient compliant with medications and treatment plan. Patient is interacting well on the unit. Pt is safe on the unit.   01/14/23 0900  Psych Admission Type (Psych Patients Only)  Admission Status Voluntary  Psychosocial Assessment  Patient Complaints Anxiety;Depression  Eye Contact Brief  Facial Expression Sad;Flat  Affect Appropriate to circumstance  Speech Logical/coherent  Interaction Childlike  Motor Activity Slow  Appearance/Hygiene Disheveled  Behavior Characteristics Cooperative  Mood Depressed;Sad;Anxious  Thought Process  Coherency WDL  Content WDL  Delusions None reported or observed  Perception WDL  Hallucination None reported or observed  Judgment Poor  Confusion None  Danger to Self  Current suicidal ideation? Denies  Description of Suicide Plan No plan  Self-Injurious Behavior No self-injurious ideation or behavior indicators observed or expressed   Agreement Not to Harm Self Yes  Description of Agreement Verbal  Danger to Others  Danger to Others None reported or observed

## 2023-01-14 NOTE — [facility] Group Notes (Signed)
  Spiritual care group on loss and grief facilitated by Chaplain Katy Aeris Hersman, B[facility]   Group goal: Support / education around grief.   Identifying grief patterns, feelings / responses to grief, identifying behaviors that may emerge from grief responses, identifying when one may call on an ally or coping skill.   Group Description:   Following introductions and group rules, group opened with psycho-social ed. Group members engaged in facilitated dialog around topic of loss, with particular support around experiences of loss in their lives. Group Identified types of loss (relationships / self / things) and identified patterns, circumstances, and changes that precipitate losses. Reflected on thoughts / feelings around loss, normalized grief responses, and recognized variety in grief experience.   Group engaged in visual explorer activity, identifying elements of grief journey as well as needs / ways of caring for themselves. Group reflected on Worden's tasks of grief.   Group facilitation drew on brief cognitive behavioral, narrative, and Adlerian modalities   Patient progress: Did not attend.  

## 2023-01-14 NOTE — [facility] Group Notes (Signed)
Adult Psychoeducational Group Note  Date:  01/14/2023 Time:  10:44 PM  Group Topic/Focus:  Wrap-Up Group:   The focus of this group is to help patients review their daily goal of treatment and discuss progress on daily workbooks.  Participation Level:  Did Not Attend  [name] 01/14/2023, 10:44 PM

## 2023-01-14 NOTE — [facility] Group Notes (Signed)
Adult Psychoeducational Group Note  Date:  01/14/2023 Time:  4:10 PM  Group Topic/Focus:  Coping With Mental Health Crisis:   The purpose of this group is to help patients identify strategies for coping with mental health crisis.  Group discusses possible causes of crisis and ways to manage them effectively.  Participation Level:  Minimal  Participation Quality:  Appropriate  Affect:  Appropriate  Cognitive:  Appropriate  Insight: Improving  Engagement in Group:  Improving  Modes of Intervention:  Discussion  Additional Comments:    Lucilla Edin 01/14/2023, 4:10 PM

## 2023-01-14 NOTE — Plan of Care (Signed)
  Problem: Coping: Goal: Coping ability will improve Outcome: Progressing   Problem: Activity: Goal: Interest or engagement in leisure activities will improve Outcome: Progressing   

## 2023-01-14 NOTE — Progress Notes (Signed)
Patient ID: Yolanda [name], female   DOB: 07-Aug-1975, 47 y.o.   MRN: [id number] [facility] MD Progress Note  01/14/2023 4:37 PM Aeryn ARIETTA TRONCOSO  MRN:  [id number]  Reason for admission: This is the first psychiatric admission in this [facility] in 12 years for this 11 AA female with an extensive hx of mental illnesses & probable polysubstance use disorders. She is admitted to the [facility] from the [facility] hospital with complain of worsening suicidal ideations with plan to stab herself. Per chart review, patient apparently reported at the ED that she has been depressed for a while & has not been taking her mental health medications. After medical evaluation.clearance, she was transferred to the [facility] for further psychiatric evaluation/treatments.  APS report on 6/24 Has payee, no legal guardian Continues to await placement  Overnight events:  Presented with extreme agitation last night as per nursing reports due to room lock out orders being implemented, & given agitation protocol medications yesterday evening as a result. Cooperative after that with no further concerns overnight. V/S WNL. Med compliant.   Patient assessment note: Denies SI/HI/AVH, denies paranoia, mood remains very depressed as per both objective and subjective assessments. Attention to personal hygiene and grooming is poor, with pt found in bed earlier today morning naked, and with room smelling of urine. Asked pt why she was lying in bed naked and she stated that she prefers for urine not to soak her clothing, but to go directly unto the bed. Writer educated pt on the need to use incontinent briefs and informed pt's RN to provide her with incontinent briefs.  Pt reports that sleep last night was good, she reports a good appetite, denies being in any physical pain. She denies medication side effects. We are continuing medications as listed below, with no changes today. Still awaiting placement for patient as her cognitive disabilities render her  incapable of taking care of herself independently outside of the hospital environment. Continuing medications as listed below with no changes at this time.   Principal Problem: Schizoaffective disorder, depressive type (HCC)   Diagnosis: Principal Problem:   Schizoaffective disorder, depressive type (HCC) Active Problems:   GERD (gastroesophageal reflux disease)   Intellectual disability   Tobacco use disorder  Past Psychiatric History: See H&P.  Past Medical History:  Past Medical History:  Diagnosis Date   Bipolar affect, depressed (HCC)    Constipation 08/17/2022   Depression    Falls 07/21/2022   Fracture of femoral neck, right, closed (HCC) 01/17/2022   Herpes simplex 08/22/2017   Open fracture dislocation of right elbow joint 01/17/2022    Past Surgical History:  Procedure Laterality Date   NO PAST SURGERIES     SALPINGECTOMY     Family History: History reviewed. No pertinent family history.  Family Psychiatric  History: See H&P.  Social History:  Social History   Substance and Sexual Activity  Alcohol Use Yes     Social History   Substance and Sexual Activity  Drug Use Yes   Types: Cocaine, Marijuana    Social History   Socioeconomic History   Marital status: Single    Spouse name: Not on file   Number of children: Not on file   Years of education: Not on file   Highest education level: Not on file  Occupational History   Not on file  Tobacco Use   Smoking status: Every Day   Smokeless tobacco: Not on file  Substance and Sexual Activity   Alcohol use:  Yes   Drug use: Yes    Types: Cocaine, Marijuana   Sexual activity: Yes  Other Topics Concern   Not on file  Social History Narrative   Not on file   Social Determinants of Health   Financial Resource Strain: Not on file  Food Insecurity: Patient Declined (12/20/2022)   Hunger Vital Sign    Worried About Running Out of Food in the Last Year: Patient declined    Ran Out of Food in the Last  Year: Patient declined  Transportation Needs: No Transportation Needs (12/20/2022)   PRAPARE - Administrator, Civil Service (Medical): No    Lack of Transportation (Non-Medical): No  Physical Activity: Not on file  Stress: Not on file  Social Connections: Not on file   Current Medications: Current Facility-Administered Medications  Medication Dose Route Frequency Provider Last Rate Last Admin   acetaminophen (TYLENOL) tablet 650 mg  650 mg Oral Q6H PRN [name], NP   650 mg at 12/29/22 1600   alum & mag hydroxide-simeth (MAALOX/MYLANTA) 200-200-20 MG/5ML suspension 30 mL  30 mL Oral Q4H PRN [name], NP       busPIRone (BUSPAR) tablet 15 mg  15 mg Oral BID [name], NP   15 mg at 01/14/23 2440   cyanocobalamin (VITAMIN B12) injection 1,000 mcg  1,000 mcg Intramuscular Weekly [name], Nadir, MD   1,000 mcg at 01/12/23 1206   Followed by   Melene Muller ON 02/02/2023] cyanocobalamin (VITAMIN B12) injection 1,000 mcg  1,000 mcg Intramuscular Q30 days [name], Nadir, MD       diphenhydrAMINE (BENADRYL) capsule 50 mg  50 mg Oral TID PRN [name], NP       Or   diphenhydrAMINE (BENADRYL) injection 50 mg  50 mg Intramuscular TID PRN [name], NP       feeding supplement (ENSURE ENLIVE / ENSURE PLUS) liquid 237 mL  237 mL Oral TID BM Emiline Mancebo, NP   237 mL at 01/13/23 1337   haloperidol (HALDOL) tablet 5 mg  5 mg Oral TID PRN [name], NP   5 mg at 01/13/23 1832   Or   haloperidol lactate (HALDOL) injection 5 mg  5 mg Intramuscular TID PRN [name], NP       hydrOXYzine (ATARAX) tablet 25 mg  25 mg Oral TID PRN [name], DO   25 mg at 01/13/23 2126   LORazepam (ATIVAN) tablet 2 mg  2 mg Oral TID PRN [name], NP       Or   LORazepam (ATIVAN) injection 2 mg  2 mg Intramuscular TID PRN [name], NP       magnesium hydroxide (MILK OF MAGNESIA) suspension 30 mL  30 mL Oral Daily PRN [name], NP       melatonin tablet 5 mg  5 mg Oral  QHS Ruther Ephraim, NP   5 mg at 01/13/23 2126   nicotine (NICODERM CQ - dosed in mg/24 hours) patch 21 mg  21 mg Transdermal Daily [name], DO   21 mg at 01/14/23 0748   paliperidone (INVEGA) 24 hr tablet 3 mg  3 mg Oral Daily [name], NP   3 mg at 01/14/23 0749   pantoprazole (PROTONIX) EC tablet 40 mg  40 mg Oral Daily [name], NP   40 mg at 01/14/23 0749   polyethylene glycol (MIRALAX / GLYCOLAX) packet 17 g  17 g Oral BID [name], DO  17 g at 01/13/23 1628   sertraline (ZOLOFT) tablet 100 mg  100 mg Oral Daily Park Pope, MD   100 mg at 01/14/23 0749   sulfamethoxazole-trimethoprim (BACTRIM DS) 800-160 MG per tablet 1 tablet  1 tablet Oral Q12H [name], NP   1 tablet at 01/14/23 0749   traZODone (DESYREL) tablet 50 mg  50 mg Oral QHS [name], NP   50 mg at 01/13/23 2125   Lab Results:  Results for orders placed or performed during the hospital encounter of 12/20/22 (from the past 48 hour(s))  Urinalysis, Routine w reflex microscopic -Urine, Clean Catch     Status: Abnormal   Collection Time: 01/13/23  3:09 PM  Result Value Ref Range   Color, Urine YELLOW YELLOW   APPearance CLOUDY (A) CLEAR   Specific Gravity, Urine 1.024 1.005 - 1.030   pH 5.0 5.0 - 8.0   Glucose, UA NEGATIVE NEGATIVE mg/dL   Hgb urine dipstick NEGATIVE NEGATIVE   Bilirubin Urine NEGATIVE NEGATIVE   Ketones, ur NEGATIVE NEGATIVE mg/dL   Protein, ur NEGATIVE NEGATIVE mg/dL   Nitrite NEGATIVE NEGATIVE   Leukocytes,Ua SMALL (A) NEGATIVE   RBC / HPF 11-20 0 - 5 RBC/hpf   WBC, UA 11-20 0 - 5 WBC/hpf   Bacteria, UA RARE (A) NONE SEEN   Squamous Epithelial / HPF 11-20 0 - 5 /HPF   Mucus PRESENT    Hyaline Casts, UA PRESENT    Ca Oxalate Crys, UA PRESENT     Comment: Performed at [facility], [address], [place], [place] [address]    Blood Alcohol level:  Lab Results  Component Value Date   [facility] <10 12/19/2022   ETH <10 11/21/2019   Metabolic  Disorder Labs: Lab Results  Component Value Date   HGBA1C 4.4 (L) 12/21/2022   MPG 79.58 12/21/2022   No results found for: "PROLACTIN" Lab Results  Component Value Date   CHOL 218 (H) 12/21/2022   TRIG 81 12/21/2022   HDL 53 12/21/2022   CHOLHDL 4.1 12/21/2022   VLDL 16 12/21/2022   LDLCALC 149 (H) 12/21/2022   LDLCALC 110 (H) 03/13/2011   Physical Findings: AIMS: Facial and Oral Movements Muscles of Facial Expression: None, normal Lips and Perioral Area: None, normal Jaw: None, normal Tongue: None, normal,Extremity Movements Upper (arms, wrists, hands, fingers): None, normal Lower (legs, knees, ankles, toes): None, normal, Trunk Movements Neck, shoulders, hips: None, normal, Overall Severity Severity of abnormal movements (highest score from questions above): None, normal Incapacitation due to abnormal movements: None, normal Patient's awareness of abnormal movements (rate only patient's report): No Awareness, Dental Status Current problems with teeth and/or dentures?: No Does patient usually wear dentures?: No   Musculoskeletal: Strength & Muscle Tone: within normal limits Gait & Station: normal Patient leans: N/A  Psychiatric Specialty Exam:  Presentation  General Appearance:  Lying down in bed, better hygiene noted  Eye Contact: Fair Speech: Clear and Coherent  Speech Volume: Normal  Handedness: Right  Mood and Affect  Mood: Depressed  Affect: Congruent  Thought Process  Thought Processes: Coherent  Descriptions of Associations:Intact  Orientation:Full (Time, Place and Person)  Thought Content: Denies SI HI or AVH does not present responding to stimuli. Presentation consistent with moderate intellectual disability  History of Schizophrenia/Schizoaffective disorder:Yes  Duration of Psychotic Symptoms:Greater than six months  Hallucinations:Hallucinations: None Description of Auditory Hallucinations: admitted to [facility] of whispers to her  nurse earlier today moring  Ideas of Reference:None  Suicidal Thoughts:Suicidal Thoughts: No  Homicidal Thoughts:Homicidal Thoughts: No  Sensorium  Memory: Immediate Good  Judgment: Limited Insight: Limited Executive Functions  Concentration: Poor  Attention Span: Poor  Recall: Poor  Fund of Knowledge: Poor  Language: Fair  Psychomotor Activity  Psychomotor Activity: Psychomotor Activity: Normal  Assets  Assets: Desire for Improvement; Resilience  Sleep  Sleep: Sleep: Good  Physical Exam: Physical Exam Vitals and nursing note reviewed.  Constitutional:      General: She is not in acute distress.    Appearance: She is not ill-appearing or diaphoretic.  HENT:     Head: Normocephalic.     Nose: No congestion.     Mouth/Throat:     Pharynx: Oropharynx is clear.  Eyes:     Pupils: Pupils are equal, round, and reactive to light.  Pulmonary:     Effort: Pulmonary effort is normal. No respiratory distress.  Genitourinary:    Comments: Deferred Musculoskeletal:        General: Normal range of motion.     Cervical back: Normal range of motion.  Skin:    General: Skin is warm and dry.  Neurological:     General: No focal deficit present.     Mental Status: She is alert.    Blood pressure 110/88, pulse 76, temperature 97.8 F (36.6 C), temperature source Oral, resp. rate 20, height 4\' 11"  (1.499 m), weight 63 kg, SpO2 100 %. Body mass index is 28.05 kg/m.  Treatment Plan Summary: Daily contact with patient to assess and evaluate symptoms and progress in treatment and Medication management.   Still waiting on safe disposition as patient would not be able to live independently given cognitive impairment. Recommend assisted living.   No medication side effects.  BP normalized, likely 2/2 machine error over the past day.  Because of urinary incontinence, recommend adult diapers.  Principal/active diagnoses:  Principal Problem:   Schizoaffective  disorder, depressive type (HCC) Active Problems:   GERD (gastroesophageal reflux disease)   Intellectual disability   Tobacco use disorder (MOCA 16/30) moderate cognitive impairment probably related to moderate intellectual disability.  Plan:  -Continue Buspar 15 mg po bid for anxiety.  -Continue paliperidone 3 mg po daily for mood stabilization -Continued Sertraline 100 mg po Q daily for depression/anxiety -Continue Trazodone 50 mg po Q bedtime prn for insomnia -Continue Nicoderm topically Q 24 hrs for nicotine withdrawal management -Continue vitamin B12 1000 mcg IM weekly for 4 weeks then monthly -Continue Melatonin 5 mg nightly for sleep -Continue Ensure nutritional shakes TID in between meals  -Previously discontinued Hydroxyzine 50 mg nightly-hypotension in the mornings  Lab work and EKG, reviewed with no significant abnormalities noted, exc ept slightly low b12 166.  Safety and Monitoring: Voluntary admission to inpatient psychiatric unit for safety, stabilization and treatment Daily contact with patient to assess and evaluate symptoms and progress in treatment Patient's case to be discussed in multi-disciplinary team meeting Observation Level : q15 minute checks Vital signs: q12 hours Precautions: Safety   Discharge Planning: Social work and case management to assist with discharge planning and identification of hospital follow-up needs prior to discharge Estimated LOS: Unknown at this time. Discharge Concerns: Need to establish a safety plan; Medication compliance and effectiveness Discharge Goals: Return home with outpatient referrals for mental health follow-up including medication management/psychotherapy No legal guardian, has payee  [name], NP 01/14/2023, 4:37 PMPatient ID: Larey Brick, female   DOB: September 19, 1975, 47 y.o.   MRN: [id number]

## 2023-01-14 NOTE — BH IP Treatment Plan (Addendum)
Interdisciplinary Treatment and Diagnostic Plan Update  01/14/2023 Time of Session: 8:25 AM (UPDATE)  Yolanda [name] MRN: [id number]  Principal Diagnosis: Schizoaffective disorder, depressive type (HCC)  Secondary Diagnoses: Principal Problem:   Schizoaffective disorder, depressive type (HCC) Active Problems:   GERD (gastroesophageal reflux disease)   Intellectual disability   Tobacco use disorder   Current Medications:  Current Facility-Administered Medications  Medication Dose Route Frequency Provider Last Rate Last Admin   acetaminophen (TYLENOL) tablet 650 mg  650 mg Oral Q6H PRN [name], NP   650 mg at 12/29/22 1600   alum & mag hydroxide-simeth (MAALOX/MYLANTA) 200-200-20 MG/5ML suspension 30 mL  30 mL Oral Q4H PRN [name], NP       busPIRone (BUSPAR) tablet 15 mg  15 mg Oral BID [name], NP   15 mg at 01/14/23 4098   cyanocobalamin (VITAMIN B12) injection 1,000 mcg  1,000 mcg Intramuscular Weekly [name], Nadir, MD   1,000 mcg at 01/12/23 1206   Followed by   Melene Muller ON 02/02/2023] cyanocobalamin (VITAMIN B12) injection 1,000 mcg  1,000 mcg Intramuscular Q30 days [name], Nadir, MD       diphenhydrAMINE (BENADRYL) capsule 50 mg  50 mg Oral TID PRN [name], NP       Or   diphenhydrAMINE (BENADRYL) injection 50 mg  50 mg Intramuscular TID PRN [name], NP       feeding supplement (ENSURE ENLIVE / ENSURE PLUS) liquid 237 mL  237 mL Oral TID BM Nkwenti, Doris, NP   237 mL at 01/13/23 1337   haloperidol (HALDOL) tablet 5 mg  5 mg Oral TID PRN [name], NP   5 mg at 01/13/23 1832   Or   haloperidol lactate (HALDOL) injection 5 mg  5 mg Intramuscular TID PRN [name], NP       hydrOXYzine (ATARAX) tablet 25 mg  25 mg Oral TID PRN [name], DO   25 mg at 01/13/23 2126   LORazepam (ATIVAN) tablet 2 mg  2 mg Oral TID PRN [name], NP       Or   LORazepam (ATIVAN) injection 2 mg  2 mg Intramuscular TID PRN [name], NP        magnesium hydroxide (MILK OF MAGNESIA) suspension 30 mL  30 mL Oral Daily PRN [name], NP       melatonin tablet 5 mg  5 mg Oral QHS Nkwenti, Doris, NP   5 mg at 01/13/23 2126   nicotine (NICODERM CQ - dosed in mg/24 hours) patch 21 mg  21 mg Transdermal Daily [name], DO   21 mg at 01/14/23 0748   paliperidone (INVEGA) 24 hr tablet 3 mg  3 mg Oral Daily [name], NP   3 mg at 01/14/23 0749   pantoprazole (PROTONIX) EC tablet 40 mg  40 mg Oral Daily [name], NP   40 mg at 01/14/23 0749   polyethylene glycol (MIRALAX / GLYCOLAX) packet 17 g  17 g Oral BID [name], DO   17 g at 01/13/23 1628   sertraline (ZOLOFT) tablet 100 mg  100 mg Oral Daily Park Pope, MD   100 mg at 01/14/23 0749   sulfamethoxazole-trimethoprim (BACTRIM DS) 800-160 MG per tablet 1 tablet  1 tablet Oral Q12H [name], NP   1 tablet at 01/14/23 0749   traZODone (DESYREL) tablet 50 mg  50 mg Oral QHS [name], NP   50 mg at  01/13/23 2125   PTA Medications: Medications Prior to Admission  Medication Sig Dispense Refill Last Dose   busPIRone (BUSPAR) 15 MG tablet Take 15 mg by mouth 2 (two) times daily. (Patient not taking: Reported on 12/19/2022)      paliperidone (INVEGA SUSTENNA) 156 MG/ML SUSY injection Inject 156 mg into the muscle once. (Patient not taking: Reported on 12/19/2022)      sertraline (ZOLOFT) 50 MG tablet Take 150 mg by mouth daily. (Patient not taking: Reported on 12/19/2022)      traZODone (DESYREL) 100 MG tablet Take 100 mg by mouth at bedtime as needed for sleep. (Patient not taking: Reported on 11/12/2022)       Patient Stressors: Medication change or noncompliance    Patient Strengths: Forensic psychologist fund of knowledge   Treatment Modalities: Medication Management, Group therapy, Case management,  1 to 1 session with clinician, Psychoeducation, Recreational therapy.   Physician Treatment Plan for Primary Diagnosis: Schizoaffective disorder,  depressive type (HCC) Long Term Goal(s): Improvement in symptoms so as ready for discharge   Short Term Goals: Ability to identify and develop effective coping behaviors will improve Ability to maintain clinical measurements within normal limits will improve Compliance with prescribed medications will improve Ability to identify triggers associated with substance abuse/mental health issues will improve Ability to identify changes in lifestyle to reduce recurrence of condition will improve Ability to verbalize feelings will improve Ability to disclose and discuss suicidal ideas Ability to demonstrate self-control will improve  Medication Management: Evaluate patient's response, side effects, and tolerance of medication regimen.  Therapeutic Interventions: 1 to 1 sessions, Unit Group sessions and Medication administration.  Evaluation of Outcomes: Progressing  Physician Treatment Plan for Secondary Diagnosis: Principal Problem:   Schizoaffective disorder, depressive type (HCC) Active Problems:   GERD (gastroesophageal reflux disease)   Intellectual disability   Tobacco use disorder  Long Term Goal(s): Improvement in symptoms so as ready for discharge   Short Term Goals: Ability to identify and develop effective coping behaviors will improve Ability to maintain clinical measurements within normal limits will improve Compliance with prescribed medications will improve Ability to identify triggers associated with substance abuse/mental health issues will improve Ability to identify changes in lifestyle to reduce recurrence of condition will improve Ability to verbalize feelings will improve Ability to disclose and discuss suicidal ideas Ability to demonstrate self-control will improve     Medication Management: Evaluate patient's response, side effects, and tolerance of medication regimen.  Therapeutic Interventions: 1 to 1 sessions, Unit Group sessions and Medication  administration.  Evaluation of Outcomes: Progressing   RN Treatment Plan for Primary Diagnosis: Schizoaffective disorder, depressive type (HCC) Long Term Goal(s): Knowledge of disease and therapeutic regimen to maintain health will improve  Short Term Goals: Ability to remain free from injury will improve, Ability to verbalize frustration and anger appropriately will improve, Ability to participate in decision making will improve, Ability to verbalize feelings will improve, Ability to identify and develop effective coping behaviors will improve, and Compliance with prescribed medications will improve  Medication Management: RN will administer medications as ordered by provider, will assess and evaluate patient's response and provide education to patient for prescribed medication. RN will report any adverse and/or side effects to prescribing provider.  Therapeutic Interventions: 1 on 1 counseling sessions, Psychoeducation, Medication administration, Evaluate responses to treatment, Monitor vital signs and CBGs as ordered, Perform/monitor CIWA, COWS, AIMS and Fall Risk screenings as ordered, Perform wound care treatments as ordered.  Evaluation of Outcomes:  Progressing   LCSW Treatment Plan for Primary Diagnosis: Schizoaffective disorder, depressive type (HCC) Long Term Goal(s): Safe transition to appropriate next level of care at discharge, Engage patient in therapeutic group addressing interpersonal concerns.  Short Term Goals: Engage patient in aftercare planning with referrals and resources, Increase social support, Increase emotional regulation, Facilitate acceptance of mental health diagnosis and concerns, Identify triggers associated with mental health/substance abuse issues, and Increase skills for wellness and recovery  Therapeutic Interventions: Assess for all discharge needs, 1 to 1 time with Social worker, Explore available resources and support systems, Assess for adequacy in  community support network, Educate family and significant other(s) on suicide prevention, Complete Psychosocial Assessment, Interpersonal group therapy.  Evaluation of Outcomes: Progressing   Progress in Treatment: Attending groups: Yes. Participating in groups: Yes. Taking medication as prescribed: Yes. Toleration medication: Yes. Family/Significant other contact made: Yes, individual(s) contacted:  mother Patient understands diagnosis: Yes. Discussing patient identified problems/goals with staff: Yes. Medical problems stabilized or resolved: Yes. Denies suicidal/homicidal ideation: Yes. Issues/concerns per patient self-inventory: No.     New problem(s) identified: No, Describe:  none reported    New Short Term/Long Term Goal(s):    medication stabilization, elimination of SI thoughts, development of comprehensive mental wellness plan.      Patient Goals: Pt will continue to work on initial tx team goals     Discharge Plan or Barriers:Placement.  APS involvement.      Reason for Continuation of Hospitalization: Anxiety Depression Medication stabilization Suicidal ideation   Estimated Length of Stay: TBD, PT IS LOOKING FOR PLACEMENT FOR A GROUP HOME    Last 3 [place] Suicide Severity Risk Score: Flowsheet Row Ad[facility] (Current) [facility] 12[facility]19/2022 in [facility] Department at [facility] ED from 11/12/2022 in [facility] Department at [facility]  C-SSRS RISK CATEGORY High Risk High Risk Moderate Risk       Last [facility] 2/9 Scores:     No data to display          Scribe for Treatment Team: Beather Arbour 01/14/2023 8:18 AM

## 2023-01-15 DIAGNOSIS — F251 Schizoaffective disorder, depressive type: Secondary | ICD-10-CM | POA: Diagnosis not present

## 2023-01-15 MED ORDER — PALIPERIDONE ER 6 MG PO TB24
6.0000 mg | ORAL_TABLET | Freq: Every day | ORAL | Status: DC
  Administered 2023-01-15 – 2023-08-08 (×205): 6 mg via ORAL
  Filled 2023-01-15 (×13): qty 1
  Filled 2023-01-15: qty 7
  Filled 2023-01-15 (×201): qty 1

## 2023-01-15 NOTE — BHH Counselor (Signed)
CSW, met with pt and Social Worker Interior and spatial designer form [employer] [facility] [employer] [facility] at [employer] am in [facility] conference room. CSW and pt provided Ms. Moore with pt BH history and SW indicated that she had worked with pt several years ago and was familiar with the pt when she had been residing with her mother. SW [name] conducted an assessment and states that she will f/u once she identifies appropriate services to assist patient with her needs. Will continue to monitor as needed.

## 2023-01-15 NOTE — Progress Notes (Signed)
Patient ID: Yolanda [name], female   DOB: 03/23/1976, 47 y.o.   MRN: [id number] Pt presents with depressed mood, affect congruent. [name] was isolative to her room this am. Noted continued poor cleanliness in her room, encouraged pt to clean and offered assistance, pt requested to rest this am. Later in shift pt became very tearful and approached staff stating she was hearing voices. Offered prn for agitation and pt accepted, then cry and reported feeling anxious.  Pt has been observed at times to become very tremulous when anxious. Allowed pt to ventilate and support given. She has declined to complete self inventory but did accept medications once brought to her room.  Pt is safe, able to make her needs known. Will con't to monitor.

## 2023-01-15 NOTE — [facility] Group Notes (Signed)
Adult Psychoeducational Group Note  Date:  01/15/2023 Time:  9:43 AM  Group Topic/Focus:  Goals Group:   The focus of this group is to help patients establish daily goals to achieve during treatment and discuss how the patient can incorporate goal setting into their daily lives to aide in recovery. Orientation:   The focus of this group is to educate the patient on the purpose and policies of crisis stabilization and provide a format to answer questions about their admission.  The group details unit policies and expectations of patients while admitted.  Participation Level:  Did Not Attend  Participation Quality:    Affect:    Cognitive:    Insight:   Engagement in Group:    Modes of Intervention:    Additional Comments:  Pt did not attend group today   Burnett Sheng 01/15/2023, 9:43 AM

## 2023-01-15 NOTE — Progress Notes (Signed)
   01/14/23 2020  Psych Admission Type (Psych Patients Only)  Admission Status Voluntary  Psychosocial Assessment  Patient Complaints Anxiety;Depression  Eye Contact Brief  Facial Expression Sad;Flat  Affect Appropriate to circumstance  Speech Logical/coherent  Interaction Isolative;Childlike  Motor Activity Slow  Appearance/Hygiene Poor hygiene  Behavior Characteristics Cooperative  Mood Sad;Fearful  Thought Process  Coherency WDL  Content WDL  Delusions None reported or observed  Perception WDL  Hallucination None reported or observed  Judgment Poor  Confusion None  Danger to Self  Current suicidal ideation? Denies  Self-Injurious Behavior No self-injurious ideation or behavior indicators observed or expressed   Agreement Not to Harm Self Yes  Description of Agreement verbal  Danger to Others  Danger to Others None reported or observed

## 2023-01-15 NOTE — Plan of Care (Signed)
  Problem: Coping: Goal: Coping ability will improve Outcome: Progressing Goal: Will verbalize feelings Outcome: Progressing   Problem: Role Relationship: Goal: Will demonstrate positive changes in social behaviors and relationships Outcome: Progressing   Problem: Safety: Goal: Ability to identify and utilize support systems that promote safety will improve Outcome: Progressing   Problem: Self-Concept: Goal: Will verbalize positive feelings about self Outcome: Progressing Goal: Level of anxiety will decrease Outcome: Progressing

## 2023-01-15 NOTE — Group Note (Signed)
[facility] LCSW Group Therapy Note   Group Date: 01/15/2023 Start Time: 1100 End Time: 1200   Type of Therapy and Topic: Group Therapy: Avoiding Self-Sabotaging and Enabling Behaviors  Participation Level: Did Not Attend    Description of Group:  In this group, patients will learn how to identify obstacles, self-sabotaging and enabling behaviors, as well as: what are they, why do we do them and what needs these behaviors meet. Discuss unhealthy relationships and how to have positive healthy boundaries with those that sabotage and enable. Explore aspects of self-sabotage and enabling in yourself and how to limit these self-destructive behaviors in everyday life.   Therapeutic Goals: 1. Patient will identify one obstacle that relates to self-sabotage and enabling behaviors 2. Patient will identify one personal self-sabotaging or enabling behavior they did prior to admission 3. Patient will state a plan to change the above identified behavior 4. Patient will demonstrate ability to communicate their needs through discussion and/or role play.       Therapeutic Modalities:  Cognitive Behavioral Therapy Person-Centered Therapy Motivational Interviewing    Marinda [place], [place]

## 2023-01-15 NOTE — Group Note (Signed)
Recreation Therapy Group Note   Group Topic:Animal Assisted Therapy   Group Date: 01/15/2023 Start Time: 0945 End Time: 1030 Facilitators: Wren Pryce-McCall, LRT,CTRS Location: [address] Dayroom      Affect/Mood: N/A   Participation Level: Did not attend    Clinical Observations/Individualized Feedback:     Plan: Continue to engage patient in RT group sessions 2-3x/week.   Fermina Mishkin-McCall, LRT,CTRS 01/15/2023 12:21 PM

## 2023-01-15 NOTE — Progress Notes (Signed)
Patient ID: Yolanda [name], female   DOB: 1975/11/24, 47 y.o.   MRN: [id number] [facility] MD Progress Note  01/15/2023 2:44 PM Yolanda [name]  MRN:  [id number]  Reason for admission: This is the first psychiatric admission in this [facility] in 12 years for this 34 AA female with an extensive hx of mental illnesses & probable polysubstance use disorders. She is admitted to the [facility] from the [facility] hospital with complain of worsening suicidal ideations with plan to stab herself. Per chart review, patient apparently reported at the ED that she has been depressed for a while & has not been taking her mental health medications. After medical evaluation.clearance, she was transferred to the [facility] for further psychiatric evaluation/treatments.  APS report on 6/24 Has payee, no legal guardian Continues to await placement  Overnight events:  V/S WNL. Med compliant. Given agitation protocol medication today afternoon at 1211 (Ativan, Benadryl & Haldol), still awaiting documentation from nursing as to why this medication was warranted.   Patient assessment note: During encounter today, pt is tearful, she endorses suicidal ideations, denies having a plan, is able to verbally contract for safety on the unit.  She reports her stressor as a lack of housing currently.  Empathy provided, and patient educated that CSW is working on coordinating the Department of Social Services to see what next steps to take for her regarding housing.  Patient endorses auditory hallucinations of voices, commanding in nature, telling her to shoot herself "in the brains".  She reports thought broadcasting, states that she feels as though people know what she is thinking, and reports thought withdrawal, states random people are able to take out her thoughts.  She presents with paranoia, states that she feels as if random people are out to harm her.   She denies HI/VH.  She reports a poor sleep quality last night, reports appetite has been fair.   She denies being in any physical pain, states she is moving her bowels well, with the last one being yesterday.  Sleep is most likely poor, because patient is staying isolative to her room all day, and is sleeping, rendering her unable to sleep at night.  Room lockout order continues to be in place, and nursing staff has been educated to keep patient out of the room during meals and group activities.  We will increase Invega to 6 mg nightly to help with psychosis, and hopefully help with sleep.  We will continue other medications as listed below, and we will continue to monitor.  We are uncertain as to when patient will be discharged at this point, due to the fact that her cognitive impairments render her incapable of reciting by herself, without stable housing.  CSW is continuing to coordinate with the Department of social services for housing.  Principal Problem: Schizoaffective disorder, depressive type (HCC)   Diagnosis: Principal Problem:   Schizoaffective disorder, depressive type (HCC) Active Problems:   GERD (gastroesophageal reflux disease)   Intellectual disability   Tobacco use disorder  Past Psychiatric History: See H&P.  Past Medical History:  Past Medical History:  Diagnosis Date   Bipolar affect, depressed (HCC)    Constipation 08/17/2022   Depression    Falls 07/21/2022   Fracture of femoral neck, right, closed (HCC) 01/17/2022   Herpes simplex 08/22/2017   Open fracture dislocation of right elbow joint 01/17/2022    Past Surgical History:  Procedure Laterality Date   NO PAST SURGERIES     SALPINGECTOMY  Family History: History reviewed. No pertinent family history.  Family Psychiatric  History: See H&P.  Social History:  Social History   Substance and Sexual Activity  Alcohol Use Yes     Social History   Substance and Sexual Activity  Drug Use Yes   Types: Cocaine, Marijuana    Social History   Socioeconomic History   Marital status: Single     Spouse name: Not on file   Number of children: Not on file   Years of education: Not on file   Highest education level: Not on file  Occupational History   Not on file  Tobacco Use   Smoking status: Every Day   Smokeless tobacco: Not on file  Substance and Sexual Activity   Alcohol use: Yes   Drug use: Yes    Types: Cocaine, Marijuana   Sexual activity: Yes  Other Topics Concern   Not on file  Social History Narrative   Not on file   Social Determinants of Health   Financial Resource Strain: Not on file  Food Insecurity: Patient Declined (12/20/2022)   Hunger Vital Sign    Worried About Running Out of Food in the Last Year: Patient declined    Ran Out of Food in the Last Year: Patient declined  Transportation Needs: No Transportation Needs (12/20/2022)   PRAPARE - Administrator, Civil Service (Medical): No    Lack of Transportation (Non-Medical): No  Physical Activity: Not on file  Stress: Not on file  Social Connections: Not on file   Current Medications: Current Facility-Administered Medications  Medication Dose Route Frequency Provider Last Rate Last Admin   acetaminophen (TYLENOL) tablet 650 mg  650 mg Oral Q6H PRN [name], NP   650 mg at 12/29/22 1600   alum & mag hydroxide-simeth (MAALOX/MYLANTA) 200-200-20 MG/5ML suspension 30 mL  30 mL Oral Q4H PRN [name], NP       busPIRone (BUSPAR) tablet 15 mg  15 mg Oral BID [name], NP   15 mg at 01/15/23 4098   cyanocobalamin (VITAMIN B12) injection 1,000 mcg  1,000 mcg Intramuscular Weekly [name], Nadir, MD   1,000 mcg at 01/12/23 1206   Followed by   Melene Muller ON 02/02/2023] cyanocobalamin (VITAMIN B12) injection 1,000 mcg  1,000 mcg Intramuscular Q30 days [name], Nadir, MD       diphenhydrAMINE (BENADRYL) capsule 50 mg  50 mg Oral TID PRN [name], NP   50 mg at 01/15/23 1211   Or   diphenhydrAMINE (BENADRYL) injection 50 mg  50 mg Intramuscular TID PRN [name], NP       feeding  supplement (ENSURE ENLIVE / ENSURE PLUS) liquid 237 mL  237 mL Oral TID BM Everrett Lacasse, NP   237 mL at 01/14/23 2210   haloperidol (HALDOL) tablet 5 mg  5 mg Oral TID PRN [name], NP   5 mg at 01/15/23 1211   Or   haloperidol lactate (HALDOL) injection 5 mg  5 mg Intramuscular TID PRN [name], NP       hydrOXYzine (ATARAX) tablet 25 mg  25 mg Oral TID PRN [name], DO   25 mg at 01/14/23 2209   LORazepam (ATIVAN) tablet 2 mg  2 mg Oral TID PRN [name], NP   2 mg at 01/15/23 1211   Or   LORazepam (ATIVAN) injection 2 mg  2 mg Intramuscular TID PRN [name], NP       magnesium  hydroxide (MILK OF MAGNESIA) suspension 30 mL  30 mL Oral Daily PRN [name], NP       melatonin tablet 5 mg  5 mg Oral QHS [name], NP   5 mg at 01/14/23 2209   nicotine (NICODERM CQ - dosed in mg/24 hours) patch 21 mg  21 mg Transdermal Daily [name], DO   21 mg at 01/15/23 1610   paliperidone (INVEGA) 24 hr tablet 6 mg  6 mg Oral Daily [name], NP       pantoprazole (PROTONIX) EC tablet 40 mg  40 mg Oral Daily [name], NP   40 mg at 01/15/23 9604   polyethylene glycol (MIRALAX / GLYCOLAX) packet 17 g  17 g Oral BID [name], DO   17 g at 01/15/23 0809   sertraline (ZOLOFT) tablet 100 mg  100 mg Oral Daily Park Pope, MD   100 mg at 01/15/23 5409   sulfamethoxazole-trimethoprim (BACTRIM DS) 800-160 MG per tablet 1 tablet  1 tablet Oral Q12H [name], NP   1 tablet at 01/15/23 8119   traZODone (DESYREL) tablet 50 mg  50 mg Oral QHS [name], NP   50 mg at 01/14/23 2209   Lab Results:  Results for orders placed or performed during the hospital encounter of 12/20/22 (from the past 48 hour(s))  Urinalysis, Routine w reflex microscopic -Urine, Clean Catch     Status: Abnormal   Collection Time: 01/13/23  3:09 PM  Result Value Ref Range   Color, Urine YELLOW YELLOW   APPearance CLOUDY (A) CLEAR   Specific Gravity, Urine 1.024 1.005 - 1.030    pH 5.0 5.0 - 8.0   Glucose, UA NEGATIVE NEGATIVE mg/dL   Hgb urine dipstick NEGATIVE NEGATIVE   Bilirubin Urine NEGATIVE NEGATIVE   Ketones, ur NEGATIVE NEGATIVE mg/dL   Protein, ur NEGATIVE NEGATIVE mg/dL   Nitrite NEGATIVE NEGATIVE   Leukocytes,Ua SMALL (A) NEGATIVE   RBC / HPF 11-20 0 - 5 RBC/hpf   WBC, UA 11-20 0 - 5 WBC/hpf   Bacteria, UA RARE (A) NONE SEEN   Squamous Epithelial / HPF 11-20 0 - 5 /HPF   Mucus PRESENT    Hyaline Casts, UA PRESENT    Ca Oxalate Crys, UA PRESENT     Comment: Performed at [facility], [address] [place], [place] [address]    Blood Alcohol level:  Lab Results  Component Value Date   [facility] <10 12/19/2022   ETH <10 11/21/2019   Metabolic Disorder Labs: Lab Results  Component Value Date   HGBA1C 4.4 (L) 12/21/2022   MPG 79.58 12/21/2022   No results found for: "PROLACTIN" Lab Results  Component Value Date   CHOL 218 (H) 12/21/2022   TRIG 81 12/21/2022   HDL 53 12/21/2022   CHOLHDL 4.1 12/21/2022   VLDL 16 12/21/2022   LDLCALC 149 (H) 12/21/2022   LDLCALC 110 (H) 03/13/2011   Physical Findings: AIMS: Facial and Oral Movements Muscles of Facial Expression: None, normal Lips and Perioral Area: None, normal Jaw: None, normal Tongue: None, normal,Extremity Movements Upper (arms, wrists, hands, fingers): None, normal Lower (legs, knees, ankles, toes): None, normal, Trunk Movements Neck, shoulders, hips: None, normal, Overall Severity Severity of abnormal movements (highest score from questions above): None, normal Incapacitation due to abnormal movements: None, normal Patient's awareness of abnormal movements (rate only patient's report): No Awareness, Dental Status Current problems with teeth and/or dentures?: No Does patient usually wear dentures?:  No   Musculoskeletal: Strength & Muscle Tone: within normal limits Gait & Station: normal Patient leans: N/A  Psychiatric Specialty Exam:  Presentation   General Appearance:  Lying down in bed, better hygiene noted  Eye Contact: Fair Speech: Clear and Coherent  Speech Volume: Decreased  Handedness: Right  Mood and Affect  Mood: Anxious; Depressed  Affect: Congruent  Thought Process  Thought Processes: Coherent  Descriptions of Associations:Intact  Orientation:Partial  Thought Content: Denies SI HI or AVH does not present responding to stimuli. Presentation consistent with moderate intellectual disability  History of Schizophrenia/Schizoaffective disorder:Yes  Duration of Psychotic Symptoms:Greater than six months  Hallucinations:Hallucinations: Auditory  Ideas of Reference:Paranoia; Delusions; Percusatory  Suicidal Thoughts:Suicidal Thoughts: Yes, Active SI Active Intent and/or Plan: Without Intent; Without Plan  Homicidal Thoughts:Homicidal Thoughts: No  Sensorium  Memory: Immediate Good  Judgment: Limited Insight: Limited Executive Functions  Concentration: Fair  Attention Span: Fair  Recall: Poor  Fund of Knowledge: Poor  Language: Fair  Psychomotor Activity  Psychomotor Activity: Psychomotor Activity: Normal  Assets  Assets: Resilience  Sleep  Sleep: Sleep: Poor  Physical Exam: Physical Exam Vitals and nursing note reviewed.  Constitutional:      General: She is not in acute distress.    Appearance: She is not ill-appearing or diaphoretic.  HENT:     Head: Normocephalic.     Nose: No congestion.     Mouth/Throat:     Pharynx: Oropharynx is clear.  Eyes:     Pupils: Pupils are equal, round, and reactive to light.  Pulmonary:     Effort: Pulmonary effort is normal. No respiratory distress.  Genitourinary:    Comments: Deferred Musculoskeletal:        General: Normal range of motion.     Cervical back: Normal range of motion.  Skin:    General: Skin is warm and dry.  Neurological:     General: No focal deficit present.     Mental Status: She is alert.     Blood pressure 112/82, pulse 75, temperature 97.8 F (36.6 C), temperature source Oral, resp. rate 20, height 4\' 11"  (1.499 m), weight 63 kg, SpO2 99 %. Body mass index is 28.05 kg/m.  Treatment Plan Summary: Daily contact with patient to assess and evaluate symptoms and progress in treatment and Medication management.   Still waiting on safe disposition as patient would not be able to live independently given cognitive impairment. Recommend assisted living.   No medication side effects.  BP normalized, likely 2/2 machine error over the past day.  Because of urinary incontinence, recommend adult diapers.  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  Plan:  -Continue Buspar 15 mg po bid for anxiety.  -Increase paliperidone from 3 to 6 mg po daily for psychosis & mood stabilization -Continued Sertraline 100 mg po Q daily for depression/anxiety -Continue Trazodone 50 mg po Q bedtime prn for insomnia -Continue Nicoderm topically Q 24 hrs for nicotine withdrawal management -Continue vitamin B12 1000 mcg IM weekly for 4 weeks then monthly -Continue Melatonin 5 mg nightly for sleep -Continue Ensure nutritional shakes TID in between meals  -Previously discontinued Hydroxyzine 50 mg nightly-hypotension in the mornings  Lab work and EKG, reviewed with no significant abnormalities noted, exc ept slightly low b12 166.  Safety and Monitoring: Voluntary admission  to inpatient psychiatric unit for safety, stabilization and treatment Daily contact with patient to assess and evaluate symptoms and progress in treatment Patient's case to be discussed in multi-disciplinary team meeting Observation Level : q15 minute checks Vital signs:  q12 hours Precautions: Safety   Discharge Planning: Social work and case management to assist with discharge planning and identification of hospital follow-up needs prior to discharge Estimated LOS: Unknown at this time. Discharge Concerns: Need to establish a safety plan; Medication compliance and effectiveness Discharge Goals: Return home with outpatient referrals for mental health follow-up including medication management/psychotherapy No legal guardian, has payee  [name], NP 01/15/2023, 2:44 PMPatient ID: Yolanda [name], female   DOB: [birth year], 47 y.o.   MRN: [id number] Patient ID: Yolanda [name], female   DOB: [birth year], 47 y.o.   MRN: [id number]

## 2023-01-15 NOTE — Progress Notes (Signed)
Adult Psychoeducational Group Note  Date:  01/15/2023 Time:  8:57 PM  Group Topic/Focus:  Wrap-Up Group:   The focus of this group is to help patients review their daily goal of treatment and discuss progress on daily workbooks.  Participation Level:  Active  Participation Quality:  Appropriate  Affect:  Appropriate  Cognitive:  Appropriate  Insight: Appropriate  Engagement in Group:  Engaged  Modes of Intervention:  Discussion  Additional Comments:  Pt stated her goal for the day was to attend groups.  Pt met goal.  Wynema Birch D 01/15/2023, 8:57 PM

## 2023-01-15 NOTE — Progress Notes (Signed)
D) Pt received calm, visible, participating in milieu, and in no acute distress.  Pt denies SI, HI, A/ V H, depression, and pain at this time but endorses anxiety. A) Pt encouraged to drink fluids. Pt encouraged to come to staff with needs. Pt encouraged to attend and participate in groups. Pt encouraged to set reachable goals.  R) Pt remained safe on unit, in no acute distress, will continue to assess.     01/15/23 2100  Psych Admission Type (Psych Patients Only)  Admission Status Voluntary  Psychosocial Assessment  Patient Complaints Anxiety;Depression  Eye Contact Brief  Facial Expression Flat  Affect Appropriate to circumstance  Speech Logical/coherent  Interaction Childlike  Motor Activity Slow  Appearance/Hygiene Poor hygiene  Behavior Characteristics Cooperative  Mood Sad;Fearful  Thought Process  Coherency WDL  Content WDL  Delusions None reported or observed  Perception WDL  Hallucination None reported or observed  Judgment Poor  Confusion None  Danger to Self  Current suicidal ideation? Denies  Self-Injurious Behavior No self-injurious ideation or behavior indicators observed or expressed   Agreement Not to Harm Self Yes  Description of Agreement verbal  Danger to Others  Danger to Others None reported or observed

## 2023-01-16 DIAGNOSIS — F251 Schizoaffective disorder, depressive type: Secondary | ICD-10-CM | POA: Diagnosis not present

## 2023-01-16 NOTE — Progress Notes (Signed)
   01/16/23 0851  Psych Admission Type (Psych Patients Only)  Admission Status Voluntary  Psychosocial Assessment  Patient Complaints Anxiety;Depression  Eye Contact Brief  Facial Expression Flat  Affect Depressed  Speech Soft  Interaction Isolative;Childlike  Motor Activity Slow  Appearance/Hygiene Poor hygiene;Body odor;Disheveled  Behavior Characteristics Cooperative  Mood Depressed  Thought Process  Coherency WDL  Content WDL  Delusions None reported or observed  Perception WDL  Hallucination None reported or observed  Judgment Poor  Confusion None  Danger to Self  Current suicidal ideation? Denies  Self-Injurious Behavior No self-injurious ideation or behavior indicators observed or expressed   Agreement Not to Harm Self Yes  Description of Agreement Verbal  Danger to Others  Danger to Others None reported or observed

## 2023-01-16 NOTE — Progress Notes (Signed)
Patient ID: Yolanda [name], female   DOB: [birth year], 47 y.o.   MRN: [id number] [facility] MD Progress Note  01/16/2023 3:18 PM Yolanda [name]  MRN:  [id number]  Reason for admission: This is the first psychiatric admission in this [facility] in 12 years for this 72 AA female with an extensive hx of mental illnesses & probable polysubstance use disorders. She is admitted to the [facility] from the [facility] hospital with complain of worsening suicidal ideations with plan to stab herself. Per chart review, patient apparently reported at the ED that she has been depressed for a while & has not been taking her mental health medications. After medical evaluation.clearance, she was transferred to the [facility] for further psychiatric evaluation/treatments.  APS report on 6/24 Has payee, no legal guardian Continues to await placement  Overnight events:  V/S WNL. Med compliant. Given agitation protocol medication today afternoon at 1211 (Ativan, Benadryl & Haldol), still awaiting documentation from nursing as to why this medication was warranted.   Daily notes: Yolanda [name] is seen in her room, chart reviewed. The chart findings discussed with the treatment team. She was lying down in bed. She presents with some what a reactive affect. She makes adequate eye contact when prompted to do as such. She is verbally responsive, however, she makes the most minimal statements in a monotonic voice. She reports, "I'm alright this morning, I'm just sleepy. But, I slept well last night". Again patient does not necessarily attend group sessions unless reminded. However, today the nurse reports that patient does not have any clean clothes to wear. Her clothes are apparently being washed. Hopefully, she will be able to attend group sessions tomorrow. Patient at this time is waiting on guardianship, then placement prior to discharge. She was homeless prior to admission & because of her intellectual limitations, will not be appropriate to be  discharged to a homeless shelter. She appears to be a person that will thrive in a supervised living environment. At this time, she denies any SIHI, AVH, delusional thoughts or paranoia. She does not appear to be responding to any internal stimuli.  There are no changes made on her current plan of care will continue as already in progress. This case is dicussed with the attending psychiatrist. Vital signs remain stable.  Principal Problem: Schizoaffective disorder, depressive type (HCC)   Diagnosis: Principal Problem:   Schizoaffective disorder, depressive type (HCC) Active Problems:   GERD (gastroesophageal reflux disease)   Intellectual disability   Tobacco use disorder  Past Psychiatric History: See H&P.  Past Medical History:  Past Medical History:  Diagnosis Date   Bipolar affect, depressed (HCC)    Constipation 08/17/2022   Depression    Falls 07/21/2022   Fracture of femoral neck, right, closed (HCC) 01/17/2022   Herpes simplex 08/22/2017   Open fracture dislocation of right elbow joint 01/17/2022    Past Surgical History:  Procedure Laterality Date   NO PAST SURGERIES     SALPINGECTOMY     Family History: History reviewed. No pertinent family history.  Family Psychiatric  History: See H&P.  Social History:  Social History   Substance and Sexual Activity  Alcohol Use Yes     Social History   Substance and Sexual Activity  Drug Use Yes   Types: Cocaine, Marijuana    Social History   Socioeconomic History   Marital status: Single    Spouse name: Not on file   Number of children: Not on file   Years of education:  Not on file   Highest education level: Not on file  Occupational History   Not on file  Tobacco Use   Smoking status: Every Day   Smokeless tobacco: Not on file  Substance and Sexual Activity   Alcohol use: Yes   Drug use: Yes    Types: Cocaine, Marijuana   Sexual activity: Yes  Other Topics Concern   Not on file  Social History Narrative    Not on file   Social Determinants of Health   Financial Resource Strain: Not on file  Food Insecurity: Patient Declined (12/20/2022)   Hunger Vital Sign    Worried About Running Out of Food in the Last Year: Patient declined    Ran Out of Food in the Last Year: Patient declined  Transportation Needs: No Transportation Needs (12/20/2022)   PRAPARE - Administrator, Civil Service (Medical): No    Lack of Transportation (Non-Medical): No  Physical Activity: Not on file  Stress: Not on file  Social Connections: Not on file   Current Medications: Current Facility-Administered Medications  Medication Dose Route Frequency Provider Last Rate Last Admin   acetaminophen (TYLENOL) tablet 650 mg  650 mg Oral Q6H PRN [name], NP   650 mg at 12/29/22 1600   alum & mag hydroxide-simeth (MAALOX/MYLANTA) 200-200-20 MG/5ML suspension 30 mL  30 mL Oral Q4H PRN [name], NP       busPIRone (BUSPAR) tablet 15 mg  15 mg Oral BID [name], NP   15 mg at 01/16/23 6213   cyanocobalamin (VITAMIN B12) injection 1,000 mcg  1,000 mcg Intramuscular Weekly [name], Nadir, MD   1,000 mcg at 01/12/23 1206   Followed by   Melene Muller ON 02/02/2023] cyanocobalamin (VITAMIN B12) injection 1,000 mcg  1,000 mcg Intramuscular Q30 days [name], Nadir, MD       diphenhydrAMINE (BENADRYL) capsule 50 mg  50 mg Oral TID PRN [name], NP   50 mg at 01/15/23 1211   Or   diphenhydrAMINE (BENADRYL) injection 50 mg  50 mg Intramuscular TID PRN [name], NP       feeding supplement (ENSURE ENLIVE / ENSURE PLUS) liquid 237 mL  237 mL Oral TID BM Nkwenti, Doris, NP   237 mL at 01/16/23 1008   haloperidol (HALDOL) tablet 5 mg  5 mg Oral TID PRN [name], NP   5 mg at 01/15/23 1211   Or   haloperidol lactate (HALDOL) injection 5 mg  5 mg Intramuscular TID PRN [name], NP       hydrOXYzine (ATARAX) tablet 25 mg  25 mg Oral TID PRN [name], DO   25 mg at 01/15/23 2057   LORazepam (ATIVAN)  tablet 2 mg  2 mg Oral TID PRN [name], NP   2 mg at 01/15/23 1211   Or   LORazepam (ATIVAN) injection 2 mg  2 mg Intramuscular TID PRN [name], NP       magnesium hydroxide (MILK OF MAGNESIA) suspension 30 mL  30 mL Oral Daily PRN [name], NP       melatonin tablet 5 mg  5 mg Oral QHS Nkwenti, Doris, NP   5 mg at 01/15/23 2057   nicotine (NICODERM CQ - dosed in mg/24 hours) patch 21 mg  21 mg Transdermal Daily [name], DO   21 mg at 01/16/23 0824   paliperidone (INVEGA) 24 hr tablet 6 mg  6 mg Oral Daily [name], NP  6 mg at 01/15/23 2103   pantoprazole (PROTONIX) EC tablet 40 mg  40 mg Oral Daily [name], NP   40 mg at 01/16/23 0823   polyethylene glycol (MIRALAX / GLYCOLAX) packet 17 g  17 g Oral BID [name], DO   17 g at 01/16/23 8119   sertraline (ZOLOFT) tablet 100 mg  100 mg Oral Daily Park Pope, MD   100 mg at 01/16/23 1478   sulfamethoxazole-trimethoprim (BACTRIM DS) 800-160 MG per tablet 1 tablet  1 tablet Oral Q12H [name], NP   1 tablet at 01/16/23 2956   traZODone (DESYREL) tablet 50 mg  50 mg Oral QHS [name], NP   50 mg at 01/15/23 2058   Lab Results:  No results found for this or any previous visit (from the past 48 hour(s)).   Blood Alcohol level:  Lab Results  Component Value Date   ETH <10 12/19/2022   ETH <10 11/21/2019   Metabolic Disorder Labs: Lab Results  Component Value Date   HGBA1C 4.4 (L) 12/21/2022   MPG 79.58 12/21/2022   No results found for: "PROLACTIN" Lab Results  Component Value Date   CHOL 218 (H) 12/21/2022   TRIG 81 12/21/2022   HDL 53 12/21/2022   CHOLHDL 4.1 12/21/2022   VLDL 16 12/21/2022   LDLCALC 149 (H) 12/21/2022   LDLCALC 110 (H) 03/13/2011   Physical Findings: AIMS: Facial and Oral Movements Muscles of Facial Expression: None, normal Lips and Perioral Area: None, normal Jaw: None, normal Tongue: None, normal,Extremity Movements Upper (arms, wrists, hands, fingers):  None, normal Lower (legs, knees, ankles, toes): None, normal, Trunk Movements Neck, shoulders, hips: None, normal, Overall Severity Severity of abnormal movements (highest score from questions above): None, normal Incapacitation due to abnormal movements: None, normal Patient's awareness of abnormal movements (rate only patient's report): No Awareness, Dental Status Current problems with teeth and/or dentures?: No Does patient usually wear dentures?: No   Musculoskeletal: Strength & Muscle Tone: within normal limits Gait & Station: normal Patient leans: N/A  Psychiatric Specialty Exam:  Presentation  General Appearance:  Lying down in bed, better hygiene noted  Eye Contact: Fair Speech: Clear and Coherent  Speech Volume: Decreased  Handedness: Right  Mood and Affect  Mood: Anxious; Depressed  Affect: Congruent  Thought Process  Thought Processes: Coherent  Descriptions of Associations:Intact  Orientation:Partial  Thought Content: Denies SI HI or AVH does not present responding to stimuli. Presentation consistent with moderate intellectual disability  History of Schizophrenia/Schizoaffective disorder:Yes  Duration of Psychotic Symptoms:Greater than six months  Hallucinations:Hallucinations: Auditory  Ideas of Reference:Paranoia; Delusions; Percusatory  Suicidal Thoughts:Suicidal Thoughts: Yes, Active SI Active Intent and/or Plan: Without Intent; Without Plan  Homicidal Thoughts:Homicidal Thoughts: No  Sensorium  Memory: Immediate Good  Judgment: Limited Insight: Limited Executive Functions  Concentration: Fair  Attention Span: Fair  Recall: Poor  Fund of Knowledge: Poor  Language: Fair  Psychomotor Activity  Psychomotor Activity: Psychomotor Activity: Normal  Assets  Assets: Resilience  Sleep  Sleep: Sleep: Good Number of Hours of Sleep: 8  Physical Exam: Physical Exam Vitals and nursing note reviewed.   Constitutional:      General: She is not in acute distress.    Appearance: She is not ill-appearing or diaphoretic.  HENT:     Head: Normocephalic.     Nose: No congestion.     Mouth/Throat:     Pharynx: Oropharynx is clear.  Eyes:     Pupils: Pupils are  equal, round, and reactive to light.  Pulmonary:     Effort: Pulmonary effort is normal. No respiratory distress.  Genitourinary:    Comments: Deferred Musculoskeletal:        General: Normal range of motion.     Cervical back: Normal range of motion.  Skin:    General: Skin is warm and dry.  Neurological:     General: No focal deficit present.     Mental Status: She is alert.    Blood pressure (!) 88/71, pulse 79, temperature 97.8 F (36.6 C), temperature source Oral, resp. rate 20, height 4\' 11"  (1.499 m), weight 63 kg, SpO2 100 %. Body mass index is 28.05 kg/m.  Treatment Plan Summary: Daily contact with patient to assess and evaluate symptoms and progress in treatment and Medication management.   Still waiting on safe disposition as patient would not be able to live independently given cognitive impairment. Recommend assisted living.   No medication side effects.  BP normalized, likely 2/2 machine error over the past day.  Because of urinary incontinence, recommend adult diapers.  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  Plan:  -Continue Buspar 15 mg po bid for anxiety.  -Continue paliperidone 6 mg po qd for mood stabilization -Continued Sertraline 100 mg po Q daily for depression/anxiety -Continue Trazodone 50 mg po Q bedtime prn for insomnia -Continue Nicoderm topically Q 24 hrs for nicotine withdrawal management -Continue vitamin B12 1000 mcg IM  weekly for 4 weeks then monthly -Continue Melatonin 5 mg nightly for sleep -Continue Ensure nutritional shakes TID in between meals  -Previously discontinued Hydroxyzine 50 mg nightly-hypotension in the mornings  Lab work and EKG, reviewed with no significant abnormalities noted, exc ept slightly low b12 166.  Safety and Monitoring: Voluntary admission to inpatient psychiatric unit for safety, stabilization and treatment Daily contact with patient to assess and evaluate symptoms and progress in treatment Patient's case to be discussed in multi-disciplinary team meeting Observation Level : q15 minute checks Vital signs: q12 hours Precautions: Safety   Discharge Planning: Social work and case management to assist with discharge planning and identification of hospital follow-up needs prior to discharge Estimated LOS: Unknown at this time. Discharge Concerns: Need to establish a safety plan; Medication compliance and effectiveness Discharge Goals: Return home with outpatient referrals for mental health follow-up including medication management/psychotherapy No legal guardian, has payee  Armandina Stammer, NP 01/16/2023, 3:18 PMPatient ID: Yolanda [name], female   DOB: 09/13/1975, 47 y.o.   MRN: [id number] Patient ID: Yolanda [name], female   DOB: February 26, 1976, 47 y.o.   MRN: [id number] Patient ID: Yolanda [name], female   DOB: [birth year], 47 y.o.   MRN: [id number]

## 2023-01-16 NOTE — [facility] Group Notes (Signed)
Spirituality group facilitated by Chaplain Katy Verlaine Embry, BCC.   Group Description: Group focused on topic of hope. Patients participated in facilitated discussion around topic, connecting with one another around experiences and definitions for hope. Group members engaged with visual explorer photos, reflecting on what hope looks like for them today. Group engaged in discussion around how their definitions of hope are present today in hospital.   Modalities: Psycho-social ed, Adlerian, Narrative, MI   Patient Progress: Did not attend.  

## 2023-01-16 NOTE — Plan of Care (Signed)
  Problem: Health Behavior/Discharge Planning: Goal: Compliance with therapeutic regimen will improve Outcome: Progressing   Problem: Activity: Goal: Interest or engagement in leisure activities will improve Outcome: Not Progressing Goal: Imbalance in normal sleep/wake cycle will improve Outcome: Not Progressing

## 2023-01-16 NOTE — Group Note (Signed)
Recreation Therapy Group Note   Group Topic:Communication  Group Date: 01/16/2023 Start Time: 1005 End Time: 1030 Facilitators: Keirsten Matuska-McCall, LRT,CTRS Location: [address] Dayroom   Goal Area(s) Addresses:  Patient will effectively listen to complete activity.  Patient will identify communication skills used to make activity successful.  Patient will identify how skills used during activity can be used to reach post d/c goals.    Group Descripiton: Geometric Drawings.  Three volunteers from the peer group will be shown an abstract picture with a particular arrangement of geometrical shapes.  Each round, one 'speaker' will describe the pattern, as accurately as possible without revealing the image to the group.  The remaining group members will listen and draw the picture to reflect how it is described to them. Patients with the role of 'listener' cannot ask clarifying questions but, may request that the speaker repeat a direction. Once the drawings are complete, the presenter will show the rest of the group the picture and compare how close each person came to drawing the picture. LRT will facilitate a post-activity discussion regarding effective communication and the importance of planning, listening, and asking for clarification in daily interactions with others.   Affect/Mood: N/A   Participation Level: Did not attend    Clinical Observations/Individualized Feedback:     Plan: Continue to engage patient in RT group sessions 2-3x/week.   Macguire Holsinger-McCall, LRT,CTRS 01/16/2023 12:05 PM

## 2023-01-16 NOTE — [facility] Group Notes (Signed)
Adult Psychoeducational Group Note  Date:  01/16/2023 Time:  9:53 AM  Group Topic/Focus:  Goals Group:   The focus of this group is to help patients establish daily goals to achieve during treatment and discuss how the patient can incorporate goal setting into their daily lives to aide in recovery. Spirituality:   The focus of this group is to discuss how one's spirituality can aide in recovery.  Participation Level:  Did Not Attend  Participation Quality:    Affect:    Cognitive:    Insight:   Engagement in Group:    Modes of Intervention:    Additional Comments:  Pt did not attend group today  Burnett Sheng 01/16/2023, 9:53 AM

## 2023-01-16 NOTE — Group Note (Signed)
Date:  01/16/2023 Time:  9:54 PM  Group Topic/Focus:  Wrap-Up Group:   The focus of this group is to help patients review their daily goal of treatment and discuss progress on daily workbooks.    Participation Level:  Did Not Attend  Scot Dock 01/16/2023, 9:54 PM

## 2023-01-17 DIAGNOSIS — F251 Schizoaffective disorder, depressive type: Secondary | ICD-10-CM | POA: Diagnosis not present

## 2023-01-17 NOTE — Plan of Care (Signed)
  Problem: Education: Goal: Utilization of techniques to improve thought processes will improve Outcome: Progressing Goal: Knowledge of the prescribed therapeutic regimen will improve Outcome: Progressing   Problem: Activity: Goal: Interest or engagement in leisure activities will improve Outcome: Progressing Goal: Imbalance in normal sleep/wake cycle will improve Outcome: Progressing   Problem: Safety: Goal: Ability to identify and utilize support systems that promote safety will improve Outcome: Progressing   Problem: Self-Concept: Goal: Will verbalize positive feelings about self Outcome: Not Progressing

## 2023-01-17 NOTE — Progress Notes (Signed)

## 2023-01-17 NOTE — Group Note (Signed)
Date:  01/17/2023 Time:  9:39 PM  Group Topic/Focus:  Wrap-Up Group:   The focus of this group is to help patients review their daily goal of treatment and discuss progress on daily workbooks.    Participation Level:  Active  Participation Quality:  Appropriate  Affect:  Appropriate  Cognitive:  Appropriate  Insight: Appropriate  Engagement in Group:  Engaged  Modes of Intervention:  Education  Additional Comments:   Patient attended and participated in group tonight. She reports that she called her mother today.  Lita [facility] 01/17/2023, 9:39 PM

## 2023-01-17 NOTE — [facility] Group Notes (Signed)
Adult Psychoeducational Group Note  Date:  01/17/2023 Time:  10:17 AM  Group Topic/Focus:  Dimensions of Wellness:   The focus of this group is to introduce the topic of wellness and discuss the role each dimension of wellness plays in total health. Goals Group:   The focus of this group is to help patients establish daily goals to achieve during treatment and discuss how the patient can incorporate goal setting into their daily lives to aide in recovery.  Participation Level:    Participation Quality:    Affect:    Cognitive:    Insight:   Engagement in Group:    Modes of Intervention:    Additional Comments:  Pt did not attend group today  Burnett Sheng 01/17/2023, 10:17 AM

## 2023-01-17 NOTE — Group Note (Signed)
Date:  01/17/2023 Time:  12:32 PM  [facility] LCSW Group Therapy Note   Group Date: @GROUPDATE @ Start Time: @GROUPSTARTTIME @ End Time: @GROUPENDTIME @   Type of Therapy/Topic:  Group Therapy:  Emotion Regulation  Participation Level:    Mood:  Description of Group:    The purpose of this group is to assist patients in learning to regulate negative emotions and experience positive emotions. Patients will be guided to discuss ways in which they have been vulnerable to their negative emotions. These vulnerabilities will be juxtaposed with experiences of positive emotions or situations, and patients challenged to use positive emotions to combat negative ones. Special emphasis will be placed on coping with negative emotions in conflict situations, and patients will process healthy conflict resolution skills.  Therapeutic Goals: Patient will identify two positive emotions or experiences to reflect on in order to balance out negative emotions:  Patient will label two or more emotions that they find the most difficult to experience:  Patient will be able to demonstrate positive conflict resolution skills through discussion or role plays:   Summary of Patient Progress:       Therapeutic Modalities:   Cognitive Behavioral Therapy Feelings Identification Dialectical Behavioral Therapy   Darnelle Derrick M ChriscoGroup Topic/Focus:  Managing Feelings:   The focus of this group is to identify what feelings patients have difficulty handling and develop a plan to handle them in a healthier way upon discharge.    Participation Level:  Active  Participation Quality:  Appropriate  Affect:  Appropriate  Cognitive:  Appropriate  Insight: Appropriate  Engagement in Group:  Engaged  Modes of Intervention:  Clarification, Discussion, and Rapport Building  Additional Comments:    Memory Dance Brya Simerly 01/17/2023, 12:32 PM

## 2023-01-17 NOTE — Progress Notes (Signed)
Patient ID: Yolanda [name], female   DOB: 1976/04/06, 47 y.o.   MRN: [id number] [facility] MD Progress Note  01/17/2023 11:42 AM Yolanda [name]  MRN:  [id number]  Reason for admission: This is the first psychiatric admission in this [facility] in 12 years for this 10 AA female with an extensive hx of mental illnesses & probable polysubstance use disorders. She is admitted to the [facility] from the [facility] hospital with complain of worsening suicidal ideations with plan to stab herself. Per chart review, patient apparently reported at the ED that she has been depressed for a while & has not been taking her mental health medications. After medical evaluation.clearance, she was transferred to the [facility] for further psychiatric evaluation/treatments.  APS report on 6/24 Has payee, no legal guardian Continues to await placement  Overnight events:  V/S WNL. Med compliant. Given agitation protocol medication today afternoon at 1211 (Ativan, Benadryl & Haldol), still awaiting documentation from nursing as to why this medication was warranted.   Daily notes: Racquelle is seen in her room, chart reviewed. The chart findings discussed with the treatment team. She was lying down in bed when this provider entered her room, however, she was able to sit up on her bed for this follow-up assessment. Sharicka presents with a restricted affect, however,  somewhat positively reactive when prompted to do so with a complement . She can make adequate eye contact when prompted to do as such, otherwise her eye contact is minimal. She is verbally responsive, however, she makes the most minimal statements in a monotonic voice. She reports during this evaluation, "I have anxiety now & I'm not feeling happy today, but I don't know why. I called my mama yesterday to say high. She answered the phone, but did not say nothing. That got me to feeling sad today. I did sleep well last night". When asked what her mother would have said to her to make her  feel happy, she replied, "nothing". Patient did ask what day today is? She was told told 01-17-23. She smiled & states, "July 20th will be my baby daughter's birthday".  Patient continues to take her medications as recommended. She denies any side effects. There have not been any worsening of her symptoms since yesterday. She continues her treatment as recommended. Patient at this time is waiting on guardianship, then placement prior to discharge. She was homeless prior to her admission in this [facility] & because of her intellectual limitations, will not be appropriate to be discharged to a homeless shelter. She appears to be a person that will thrive in a supervised living environment. At this time, she denies any SIHI, AVH, delusional thoughts or paranoia. She does not appear to be responding to any internal stimuli.  There are no changes made on her current plan of care,  will continue as already in progress. She requires encouragement to attend group sessions. She was reminded to go to group after this follow-up assessment. Patient did get up & attend group session. This case is dicussed with the attending psychiatrist. Vital signs remain stable.  Principal Problem: Schizoaffective disorder, depressive type (HCC)   Diagnosis: Principal Problem:   Schizoaffective disorder, depressive type (HCC) Active Problems:   GERD (gastroesophageal reflux disease)   Intellectual disability   Tobacco use disorder  Past Psychiatric History: See H&P.  Past Medical History:  Past Medical History:  Diagnosis Date   Bipolar affect, depressed (HCC)    Constipation 08/17/2022   Depression    Falls  07/21/2022   Fracture of femoral neck, right, closed (HCC) 01/17/2022   Herpes simplex 08/22/2017   Open fracture dislocation of right elbow joint 01/17/2022    Past Surgical History:  Procedure Laterality Date   NO PAST SURGERIES     SALPINGECTOMY     Family History: History reviewed. No pertinent family  history.  Family Psychiatric  History: See H&P.  Social History:  Social History   Substance and Sexual Activity  Alcohol Use Yes     Social History   Substance and Sexual Activity  Drug Use Yes   Types: Cocaine, Marijuana    Social History   Socioeconomic History   Marital status: Single    Spouse name: Not on file   Number of children: Not on file   Years of education: Not on file   Highest education level: Not on file  Occupational History   Not on file  Tobacco Use   Smoking status: Every Day   Smokeless tobacco: Not on file  Substance and Sexual Activity   Alcohol use: Yes   Drug use: Yes    Types: Cocaine, Marijuana   Sexual activity: Yes  Other Topics Concern   Not on file  Social History Narrative   Not on file   Social Determinants of Health   Financial Resource Strain: Low Risk  (09/12/2022)   Received from [facility]   Overall Financial Resource Strain (CARDIA)    Difficulty of Paying Living Expenses: Not hard at all  Food Insecurity: Patient Declined (12/20/2022)   Hunger Vital Sign    Worried About Running Out of Food in the Last Year: Patient declined    Ran Out of Food in the Last Year: Patient declined  Transportation Needs: No Transportation Needs (12/20/2022)   PRAPARE - Administrator, Civil Service (Medical): No    Lack of Transportation (Non-Medical): No  Physical Activity: Not on file  Stress: No Stress Concern Present (07/17/2022)   Received from [facility] - Occupational Stress Questionnaire    Feeling of Stress : Not at all  Social Connections: Unknown (07/16/2022)   Received from [facility]   Social Network    Social Network: Not on file   Current Medications: Current Facility-Administered Medications  Medication Dose Route Frequency Provider Last Rate Last Admin   acetaminophen (TYLENOL) tablet 650 mg  650 mg Oral Q6H PRN [name], NP   650 mg at 12/29/22 1600    alum & mag hydroxide-simeth (MAALOX/MYLANTA) 200-200-20 MG/5ML suspension 30 mL  30 mL Oral Q4H PRN [name], NP       busPIRone (BUSPAR) tablet 15 mg  15 mg Oral BID [name], NP   15 mg at 01/17/23 0748   cyanocobalamin (VITAMIN B12) injection 1,000 mcg  1,000 mcg Intramuscular Weekly Attiah, Nadir, MD   1,000 mcg at 01/12/23 1206   Followed by   Melene Muller ON 02/02/2023] cyanocobalamin (VITAMIN B12) injection 1,000 mcg  1,000 mcg Intramuscular Q30 days [name], Nadir, MD       diphenhydrAMINE (BENADRYL) capsule 50 mg  50 mg Oral TID PRN [name], NP   50 mg at 01/15/23 1211   Or   diphenhydrAMINE (BENADRYL) injection 50 mg  50 mg Intramuscular TID PRN [name], NP       feeding supplement (ENSURE ENLIVE / ENSURE PLUS) liquid 237 mL  237 mL Oral TID BM Nkwenti, Doris, NP   237 mL at 01/17/23  1047   haloperidol (HALDOL) tablet 5 mg  5 mg Oral TID PRN [name], NP   5 mg at 01/15/23 1211   Or   haloperidol lactate (HALDOL) injection 5 mg  5 mg Intramuscular TID PRN [name], NP       hydrOXYzine (ATARAX) tablet 25 mg  25 mg Oral TID PRN [name], DO   25 mg at 01/15/23 2057   LORazepam (ATIVAN) tablet 2 mg  2 mg Oral TID PRN [name], NP   2 mg at 01/15/23 1211   Or   LORazepam (ATIVAN) injection 2 mg  2 mg Intramuscular TID PRN [name], NP       magnesium hydroxide (MILK OF MAGNESIA) suspension 30 mL  30 mL Oral Daily PRN [name], NP       melatonin tablet 5 mg  5 mg Oral QHS Nkwenti, Doris, NP   5 mg at 01/16/23 2111   nicotine (NICODERM CQ - dosed in mg/24 hours) patch 21 mg  21 mg Transdermal Daily [name], DO   21 mg at 01/17/23 0749   paliperidone (INVEGA) 24 hr tablet 6 mg  6 mg Oral Daily [name], NP   6 mg at 01/15/23 2103   pantoprazole (PROTONIX) EC tablet 40 mg  40 mg Oral Daily [name], NP   40 mg at 01/17/23 0749   polyethylene glycol (MIRALAX / GLYCOLAX) packet 17 g  17 g Oral BID [name], DO   17 g at  01/16/23 1631   sertraline (ZOLOFT) tablet 100 mg  100 mg Oral Daily Park Pope, MD   100 mg at 01/17/23 0749   traZODone (DESYREL) tablet 50 mg  50 mg Oral QHS [name], NP   50 mg at 01/16/23 2111   Lab Results:  No results found for this or any previous visit (from the past 48 hour(s)).   Blood Alcohol level:  Lab Results  Component Value Date   ETH <10 12/19/2022   ETH <10 11/21/2019   Metabolic Disorder Labs: Lab Results  Component Value Date   HGBA1C 4.4 (L) 12/21/2022   MPG 79.58 12/21/2022   No results found for: "PROLACTIN" Lab Results  Component Value Date   CHOL 218 (H) 12/21/2022   TRIG 81 12/21/2022   HDL 53 12/21/2022   CHOLHDL 4.1 12/21/2022   VLDL 16 12/21/2022   LDLCALC 149 (H) 12/21/2022   LDLCALC 110 (H) 03/13/2011   Physical Findings: AIMS: Facial and Oral Movements Muscles of Facial Expression: None, normal Lips and Perioral Area: None, normal Jaw: None, normal Tongue: None, normal,Extremity Movements Upper (arms, wrists, hands, fingers): None, normal Lower (legs, knees, ankles, toes): None, normal, Trunk Movements Neck, shoulders, hips: None, normal, Overall Severity Severity of abnormal movements (highest score from questions above): None, normal Incapacitation due to abnormal movements: None, normal Patient's awareness of abnormal movements (rate only patient's report): No Awareness, Dental Status Current problems with teeth and/or dentures?: No Does patient usually wear dentures?: No   Musculoskeletal: Strength & Muscle Tone: within normal limits Gait & Station: normal Patient leans: N/A  Psychiatric Specialty Exam:  Presentation  General Appearance:  Lying down in bed, better hygiene noted  Eye Contact: Fair Speech: Slow ('speech' is a bit garbled, can be prompted to speak clearer.)  Speech Volume: Decreased  Handedness: Right  Mood and Affect  Mood: Anxious; Depressed  Affect: Restricted; Other (comment) (affect  can brighten up if patient receives a complement.)  Thought Process  Thought Processes: -- (A bit disorganized due to intellectual limitations.)  Descriptions of Associations:Tangential  Orientation:Partial  Thought Content: Denies SI HI or AVH does not present responding to stimuli. Presentation consistent with moderate intellectual disability  History of Schizophrenia/Schizoaffective disorder:Yes  Duration of Psychotic Symptoms:Greater than six months  Hallucinations:Hallucinations: None Description of Auditory Hallucinations: NA   Ideas of Reference:None  Suicidal Thoughts:Suicidal Thoughts: No SI Active Intent and/or Plan: Without Intent; Without Plan; Without Means to Carry Out; Without Access to Means   Homicidal Thoughts:Homicidal Thoughts: No   Sensorium  Memory: Immediate Fair; Recent Fair; Remote Poor  Judgment: Limited Insight: Limited Executive Functions  Concentration: Good  Attention Span: Good  Recall: Fair  Fund of Knowledge: -- (Limited)  Language: Fair  Psychomotor Activity  Psychomotor Activity: Psychomotor Activity: Decreased  Assets  Assets: Communication Skills; Desire for Improvement; Financial Resources/Insurance; Resilience  Sleep  Sleep: Sleep: Good Number of Hours of Sleep: 9  Physical Exam: Physical Exam Vitals and nursing note reviewed.  Constitutional:      General: She is not in acute distress.    Appearance: She is not ill-appearing or diaphoretic.  HENT:     Head: Normocephalic.     Nose: No congestion.     Mouth/Throat:     Pharynx: Oropharynx is clear.  Eyes:     Pupils: Pupils are equal, round, and reactive to light.  Pulmonary:     Effort: Pulmonary effort is normal. No respiratory distress.  Genitourinary:    Comments: Deferred Musculoskeletal:        General: Normal range of motion.     Cervical back: Normal range of motion.  Skin:    General: Skin is warm and dry.  Neurological:      General: No focal deficit present.     Mental Status: She is alert.    Blood pressure 90/61, pulse 73, temperature 97.8 F (36.6 C), temperature source Oral, resp. rate 20, height 4\' 11"  (1.499 m), weight 63 kg, SpO2 100%. Body mass index is 28.05 kg/m.  Treatment Plan Summary: Daily contact with patient to assess and evaluate symptoms and progress in treatment and Medication management.   Still waiting on safe disposition as patient would not be able to live independently given cognitive impairment. Recommend assisted living.   No medication side effects.  Because of urinary incontinence, recommended adult diapers to be worn.  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  Plan:  -Continue Buspar 15 mg po bid for anxiety.  -Continue paliperidone 6 mg po qd for mood stabilization -Continued Sertraline 100 mg po Q daily for depression/anxiety -Continue Trazodone 50 mg po Q bedtime prn for insomnia -Continue Nicoderm topically Q 24 hrs for nicotine withdrawal management -Continue vitamin B12 1000 mcg IM weekly for 4 weeks then monthly -Continue Melatonin 5 mg nightly for sleep -Continue Ensure nutritional shakes TID in between meals  -Previously discontinued Hydroxyzine 50 mg nightly-hypotension in the mornings  Lab work and EKG, reviewed with no significant abnormalities noted, exc ept slightly low b12 166.  Safety and Monitoring: Voluntary admission to inpatient psychiatric unit for safety, stabilization and treatment Daily contact with patient to assess and evaluate symptoms and progress in treatment Patient's case to be discussed in multi-disciplinary team meeting Observation Level : q15 minute checks Vital  signs: q12  hours Precautions: Safety   Discharge Planning: Social work and case management to assist with discharge planning and identification of hospital follow-up needs prior to discharge Estimated LOS: Unknown at this time. Discharge Concerns: Need to establish a safety plan; Medication compliance and effectiveness Discharge Goals: Return home with outpatient referrals for mental health follow-up including medication management/psychotherapy No legal guardian, has payee  Armandina Stammer, NP, pmhnp, fnp-bc. 01/17/2023, 11:42 AM Patient ID: Larey Brick, female   DOB: [birth year], 47 y.o.    MRN: [id number] Patient ID: JAKARIA LAVERGNE, female   DOB: 01-15-1976, 47 y.o.   MRN: [id number] Patient ID: MARISAH LAKER, female   DOB: 08-23-1975, 47 y.o.   MRN: [id number] Patient ID: AUDIA AMICK, female   DOB: 03-29-1976, 48 y.o.   MRN: [id number]

## 2023-01-17 NOTE — Progress Notes (Addendum)
Patient reports good mood this morning. Patient has been out of room and in the day room throughout the day. Patient has not had an episode of incontinence today and appearance is improved. In the afternoon patient retreated to her room and was tearful with her head on the bible and stating she needed to be comforted by someone. This Clinical research associate offered support. Patient denies SI, HI and AVH currently. Patient shakes head when asked to rate any depression or anxiety. Remains safe on the unit Q 15 min safety checks.   01/17/23 0900  Psych Admission Type (Psych Patients Only)  Admission Status Voluntary  Psychosocial Assessment  Eye Contact Brief  Facial Expression Flat  Affect Appropriate to circumstance  Speech Soft  Interaction Isolative;Childlike  Motor Activity Slow  Appearance/Hygiene Body odor;Poor hygiene  Behavior Characteristics Cooperative  Thought Process  Coherency WDL  Content WDL  Delusions None reported or observed  Perception WDL  Hallucination None reported or observed  Judgment Poor  Confusion None  Danger to Self  Current suicidal ideation? Denies  Self-Injurious Behavior No self-injurious ideation or behavior indicators observed or expressed   Agreement Not to Harm Self Yes  Description of Agreement verbal  Danger to Others  Danger to Others None reported or observed

## 2023-01-18 ENCOUNTER — Encounter (HOSPITAL_COMMUNITY): Payer: Self-pay

## 2023-01-18 DIAGNOSIS — F251 Schizoaffective disorder, depressive type: Secondary | ICD-10-CM | POA: Diagnosis not present

## 2023-01-18 NOTE — Progress Notes (Signed)
Patient ID: EUPHA SLAVEY, female   DOB: 30-Oct-1975, 47 y.o.   MRN: [id number] [facility] MD Progress Note  01/18/2023 3:04 PM Yolanda [name]  MRN:  [id number]  Reason for admission: This is the first psychiatric admission in this [facility] in 12 years for this 32 AA female with an extensive hx of mental illnesses & probable polysubstance use disorders. She is admitted to the [facility] from the [facility] hospital with complain of worsening suicidal ideations with plan to stab herself. Per chart review, patient apparently reported at the ED that she has been depressed for a while & has not been taking her mental health medications. After medical evaluation.clearance, she was transferred to the [facility] for further psychiatric evaluation/treatments.  APS report on 6/24 Has payee, no legal guardian Continues to await placement  Overnight events:  V/S WNL. Med compliant. Given agitation protocol medication today afternoon at 1211 (Ativan, Benadryl & Haldol), still awaiting documentation from nursing as to why this medication was warranted.   Daily notes: Yolanda [name] is seen, chart reviewed. The chart findings discussed with the treatment team. She presents alert, oriented to her name/situation.  She is not making a minimal eye contact. However, eye contact will improve momentarily when patient is prompted to do so. She presents tearful & upset this morning. She reports, "I'm having a hard time this morning. I feel depressed. I have been crying since waking up this morning. I think God is mad at me for what I did. I did something that I cannot take back.  I heard a voice when I'm not suppose to hear voices. Also, I left the day room this morning because some of the patients called me retarded. That made me mad'. Patient is instructed & encouraged to go back to the day room after this follow-up assessment & participate in the activities that is ongoing at the time. She is instructed to report to any of the staff if & when any of  the patients made fun of her or call her retarded. Patient replied, "okay". Patient also is worried about where to go after discharge as her mother has declined for her to come to her home after discharge. As for the auditory hallucination & delusional thinking mentioned above, patient is unable to described what the voice she heard was saying to her. Patient's paliperidone was recently increased for the psychosis. We are are monitoring patient for any improvement. The SW are working on patient's disposition plan once her symptoms improve. [name] currently denies any SIHI or VH. She continues to present with auditory hallucinations, delusional thinking or paranoia. As patient continues to complain of other patient's calling her retarded, no staff member has witnessed such when it was happening. Reviewed vital signs, stable. Discussed this case with the attending psychiatrist. Continue with the current plan of care as already in progress.  Principal Problem: Schizoaffective disorder, depressive type (HCC)   Diagnosis: Principal Problem:   Schizoaffective disorder, depressive type (HCC) Active Problems:   GERD (gastroesophageal reflux disease)   Intellectual disability   Tobacco use disorder  Past Psychiatric History: See H&P.  Past Medical History:  Past Medical History:  Diagnosis Date   Bipolar affect, depressed (HCC)    Constipation 08/17/2022   Depression    Falls 07/21/2022   Fracture of femoral neck, right, closed (HCC) 01/17/2022   Herpes simplex 08/22/2017   Open fracture dislocation of right elbow joint 01/17/2022    Past Surgical History:  Procedure Laterality Date   NO  PAST SURGERIES     SALPINGECTOMY     Family History: History reviewed. No pertinent family history.  Family Psychiatric  History: See H&P.  Social History:  Social History   Substance and Sexual Activity  Alcohol Use Yes     Social History   Substance and Sexual Activity  Drug Use Yes   Types:  Cocaine, Marijuana    Social History   Socioeconomic History   Marital status: Single    Spouse name: Not on file   Number of children: Not on file   Years of education: Not on file   Highest education level: Not on file  Occupational History   Not on file  Tobacco Use   Smoking status: Every Day   Smokeless tobacco: Not on file  Substance and Sexual Activity   Alcohol use: Yes   Drug use: Yes    Types: Cocaine, Marijuana   Sexual activity: Yes  Other Topics Concern   Not on file  Social History Narrative   Not on file   Social Determinants of Health   Financial Resource Strain: Low Risk  (09/12/2022)   Received from [facility]   Overall Financial Resource Strain (CARDIA)    Difficulty of Paying Living Expenses: Not hard at all  Food Insecurity: Patient Declined (12/20/2022)   Hunger Vital Sign    Worried About Running Out of Food in the Last Year: Patient declined    Ran Out of Food in the Last Year: Patient declined  Transportation Needs: No Transportation Needs (12/20/2022)   PRAPARE - Administrator, Civil Service (Medical): No    Lack of Transportation (Non-Medical): No  Physical Activity: Not on file  Stress: No Stress Concern Present (07/17/2022)   Received from [facility] - Occupational Stress Questionnaire    Feeling of Stress : Not at all  Social Connections: Unknown (07/16/2022)   Received from [facility]   Social Network    Social Network: Not on file   Current Medications: Current Facility-Administered Medications  Medication Dose Route Frequency Provider Last Rate Last Admin   acetaminophen (TYLENOL) tablet 650 mg  650 mg Oral Q6H PRN [name], NP   650 mg at 12/29/22 1600   alum & mag hydroxide-simeth (MAALOX/MYLANTA) 200-200-20 MG/5ML suspension 30 mL  30 mL Oral Q4H PRN [name], NP       busPIRone (BUSPAR) tablet 15 mg  15 mg Oral BID [name], NP   15 mg at 01/18/23 1610    cyanocobalamin (VITAMIN B12) injection 1,000 mcg  1,000 mcg Intramuscular Weekly Attiah, Nadir, MD   1,000 mcg at 01/12/23 1206   Followed by   Melene Muller ON 02/02/2023] cyanocobalamin (VITAMIN B12) injection 1,000 mcg  1,000 mcg Intramuscular Q30 days [name], Nadir, MD       diphenhydrAMINE (BENADRYL) capsule 50 mg  50 mg Oral TID PRN [name], NP   50 mg at 01/15/23 1211   Or   diphenhydrAMINE (BENADRYL) injection 50 mg  50 mg Intramuscular TID PRN [name], NP       feeding supplement (ENSURE ENLIVE / ENSURE PLUS) liquid 237 mL  237 mL Oral TID BM Nkwenti, Doris, NP   237 mL at 01/18/23 1424   haloperidol (HALDOL) tablet 5 mg  5 mg Oral TID PRN [name], NP   5 mg at 01/15/23 1211   Or   haloperidol lactate (HALDOL) injection 5 mg  5 mg  Intramuscular TID PRN [name], NP       hydrOXYzine (ATARAX) tablet 25 mg  25 mg Oral TID PRN [name], DO   25 mg at 01/18/23 1411   LORazepam (ATIVAN) tablet 2 mg  2 mg Oral TID PRN [name], NP   2 mg at 01/15/23 1211   Or   LORazepam (ATIVAN) injection 2 mg  2 mg Intramuscular TID PRN [name], NP       magnesium hydroxide (MILK OF MAGNESIA) suspension 30 mL  30 mL Oral Daily PRN [name], NP       melatonin tablet 5 mg  5 mg Oral QHS Nkwenti, Doris, NP   5 mg at 01/17/23 2048   nicotine (NICODERM CQ - dosed in mg/24 hours) patch 21 mg  21 mg Transdermal Daily [name], DO   21 mg at 01/18/23 0915   paliperidone (INVEGA) 24 hr tablet 6 mg  6 mg Oral Daily [name], NP   6 mg at 01/17/23 2048   pantoprazole (PROTONIX) EC tablet 40 mg  40 mg Oral Daily [name], NP   40 mg at 01/18/23 0914   polyethylene glycol (MIRALAX / GLYCOLAX) packet 17 g  17 g Oral BID [name], DO   17 g at 01/18/23 8119   sertraline (ZOLOFT) tablet 100 mg  100 mg Oral Daily Park Pope, MD   100 mg at 01/18/23 0914   traZODone (DESYREL) tablet 50 mg  50 mg Oral QHS [name], NP   50 mg at 01/17/23 2048   Lab Results:   No results found for this or any previous visit (from the past 48 hour(s)).   Blood Alcohol level:  Lab Results  Component Value Date   ETH <10 12/19/2022   ETH <10 11/21/2019   Metabolic Disorder Labs: Lab Results  Component Value Date   HGBA1C 4.4 (L) 12/21/2022   MPG 79.58 12/21/2022   No results found for: "PROLACTIN" Lab Results  Component Value Date   CHOL 218 (H) 12/21/2022   TRIG 81 12/21/2022   HDL 53 12/21/2022   CHOLHDL 4.1 12/21/2022   VLDL 16 12/21/2022   LDLCALC 149 (H) 12/21/2022   LDLCALC 110 (H) 03/13/2011   Physical Findings: AIMS: Facial and Oral Movements Muscles of Facial Expression: None, normal Lips and Perioral Area: None, normal Jaw: None, normal Tongue: None, normal,Extremity Movements Upper (arms, wrists, hands, fingers): None, normal Lower (legs, knees, ankles, toes): None, normal, Trunk Movements Neck, shoulders, hips: None, normal, Overall Severity Severity of abnormal movements (highest score from questions above): None, normal Incapacitation due to abnormal movements: None, normal Patient's awareness of abnormal movements (rate only patient's report): No Awareness, Dental Status Current problems with teeth and/or dentures?: No Does patient usually wear dentures?: No   Musculoskeletal: Strength & Muscle Tone: within normal limits Gait & Station: normal Patient leans: N/A  Psychiatric Specialty Exam:  Presentation  General Appearance:  Lying down in bed, better hygiene noted  Eye Contact: Fair Speech: Slow ('speech' is a bit garbled, can be prompted to speak clearer.)  Speech Volume: Decreased  Handedness: Right  Mood and Affect  Mood: Anxious; Depressed  Affect: Restricted; Other (comment) (affect can brighten up if patient receives a complement.)  Thought Process  Thought Processes: -- (A bit disorganized due to intellectual limitations.)  Descriptions of  Associations:Tangential  Orientation:Partial  Thought Content: Denies SI HI or AVH does not present responding to stimuli. Presentation consistent with moderate intellectual  disability  History of Schizophrenia/Schizoaffective disorder:Yes  Duration of Psychotic Symptoms:Greater than six months  Hallucinations:Hallucinations: None Description of Auditory Hallucinations: NA   Ideas of Reference:None  Suicidal Thoughts:Suicidal Thoughts: No SI Active Intent and/or Plan: Without Intent; Without Plan; Without Means to Carry Out; Without Access to Means   Homicidal Thoughts:Homicidal Thoughts: No   Sensorium  Memory: Immediate Fair; Recent Fair; Remote Poor  Judgment: Limited Insight: Limited Executive Functions  Concentration: Good  Attention Span: Good  Recall: Fair  Fund of Knowledge: -- (Limited)  Language: Fair  Psychomotor Activity  Psychomotor Activity: Psychomotor Activity: Decreased  Assets  Assets: Communication Skills; Desire for Improvement; Financial Resources/Insurance; Resilience  Sleep  Sleep: Sleep: Good Number of Hours of Sleep: 9  Physical Exam: Physical Exam Vitals and nursing note reviewed.  Constitutional:      General: She is not in acute distress.    Appearance: She is not ill-appearing or diaphoretic.  HENT:     Head: Normocephalic.     Nose: No congestion.     Mouth/Throat:     Pharynx: Oropharynx is clear.  Eyes:     Pupils: Pupils are equal, round, and reactive to light.  Pulmonary:     Effort: Pulmonary effort is normal. No respiratory distress.  Genitourinary:    Comments: Deferred Musculoskeletal:        General: Normal range of motion.     Cervical back: Normal range of motion.  Skin:    General: Skin is warm and dry.  Neurological:     General: No focal deficit present.     Mental Status: She is alert.    Blood pressure 91/62, pulse (!) 57, temperature 97.7 F (36.5 C), temperature source Oral, resp.  rate 20, height 4\' 11"  (1.499 m), weight 63 kg, SpO2 100%. Body mass index is 28.05 kg/m.  Treatment Plan Summary: Daily contact with patient to assess and evaluate symptoms and progress in treatment and Medication management.   Still waiting on safe disposition as patient would not be able to live independently given cognitive impairment. Recommend assisted living.   No medication side effects.  Because of urinary incontinence, recommended adult diapers to be worn.  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  Plan:  -Continue Buspar 15 mg po bid for anxiety.  -Continue paliperidone 6 mg po qd for mood stabilization -Continued Sertraline 100 mg po Q daily for depression/anxiety -Continue Trazodone 50 mg po Q bedtime prn for insomnia -Continue Nicoderm topically Q 24 hrs for nicotine withdrawal management -Continue vitamin B12 1000 mcg IM weekly for 4 weeks then monthly -Continue Melatonin 5 mg nightly for sleep -Continue Ensure nutritional shakes TID in between meals  -Previously discontinued Hydroxyzine 50 mg nightly-hypotension in the mornings  Lab work and EKG, reviewed with no significant abnormalities noted, exc ept slightly low b12 166.  Safety and Monitoring: Voluntary admission to inpatient psychiatric unit for safety, stabilization and treatment Daily contact with patient to assess and evaluate symptoms and progress in treatment Patient's case to be discussed in multi-disciplinary team meeting Observation Level : q15 minute checks Vital signs: q12 hours Precautions: Safety   Discharge Planning: Social work and case management to assist with discharge planning and identification of hospital follow-up needs prior  to discharge Estimated LOS: Unknown at this time. Discharge Concerns:  Need to establish a safety plan; Medication compliance and effectiveness Discharge Goals: Return home with outpatient referrals for mental health follow-up including medication management/psychotherapy No legal guardian, has payee  Armandina Stammer, NP, pmhnp, fnp-bc. 01/18/2023, 3:04 PM Patient ID: Larey Brick, female   DOB: 09-12-1975, 47 y.o.    MRN: [id number] Patient ID: PATICIA MELEAR, female   DOB: Jun 27, 1976, 47 y.o.   MRN: [id number] Patient ID: JENEANE REBACK, female   DOB: 10-03-1975, 47 y.o.   MRN: [id number] Patient ID: JALIYHA PERRIS, female   DOB: 03-23-1976, 47 y.o.   MRN: [id number] Patient ID: SIMRUN LAROQUE, female   DOB: April 22, 1976, 46 y.o.   MRN: [id number]

## 2023-01-18 NOTE — Progress Notes (Signed)
   01/18/23 2259  Psych Admission Type (Psych Patients Only)  Admission Status Voluntary  Psychosocial Assessment  Patient Complaints Anxiety;Depression  Eye Contact Brief  Facial Expression Flat  Affect Appropriate to circumstance  Speech Soft;Logical/coherent  Interaction Assertive  Behavior Characteristics Appropriate to situation  Mood Depressed;Pleasant  Thought Process  Coherency WDL  Content WDL  Delusions None reported or observed  Perception WDL  Hallucination None reported or observed  Judgment Poor  Confusion None  Danger to Self  Current suicidal ideation? Denies  Self-Injurious Behavior No self-injurious ideation or behavior indicators observed or expressed   Agreement Not to Harm Self Yes  Description of Agreement verbal  Danger to Others  Danger to Others None reported or observed

## 2023-01-18 NOTE — BH IP Treatment Plan (Signed)
Interdisciplinary Treatment and Diagnostic Plan Update  01/18/2023 Time of Session: 1300 Yolanda [name] MRN: [id number]  Principal Diagnosis: Schizoaffective disorder, depressive type (HCC)  Secondary Diagnoses: Principal Problem:   Schizoaffective disorder, depressive type (HCC) Active Problems:   GERD (gastroesophageal reflux disease)   Intellectual disability   Tobacco use disorder   Current Medications:  Current Facility-Administered Medications  Medication Dose Route Frequency Provider Last Rate Last Admin   acetaminophen (TYLENOL) tablet 650 mg  650 mg Oral Q6H PRN [name], NP   650 mg at 12/29/22 1600   alum & mag hydroxide-simeth (MAALOX/MYLANTA) 200-200-20 MG/5ML suspension 30 mL  30 mL Oral Q4H PRN [name], NP       busPIRone (BUSPAR) tablet 15 mg  15 mg Oral BID [name], NP   15 mg at 01/18/23 4098   cyanocobalamin (VITAMIN B12) injection 1,000 mcg  1,000 mcg Intramuscular Weekly [name], Nadir, MD   1,000 mcg at 01/12/23 1206   Followed by   Melene Muller ON 02/02/2023] cyanocobalamin (VITAMIN B12) injection 1,000 mcg  1,000 mcg Intramuscular Q30 days [name], Nadir, MD       diphenhydrAMINE (BENADRYL) capsule 50 mg  50 mg Oral TID PRN [name], NP   50 mg at 01/15/23 1211   Or   diphenhydrAMINE (BENADRYL) injection 50 mg  50 mg Intramuscular TID PRN [name], NP       feeding supplement (ENSURE ENLIVE / ENSURE PLUS) liquid 237 mL  237 mL Oral TID BM Nkwenti, Doris, NP   237 mL at 01/18/23 1424   haloperidol (HALDOL) tablet 5 mg  5 mg Oral TID PRN [name], NP   5 mg at 01/15/23 1211   Or   haloperidol lactate (HALDOL) injection 5 mg  5 mg Intramuscular TID PRN [name], NP       hydrOXYzine (ATARAX) tablet 25 mg  25 mg Oral TID PRN [name], DO   25 mg at 01/18/23 1411   LORazepam (ATIVAN) tablet 2 mg  2 mg Oral TID PRN [name], NP   2 mg at 01/15/23 1211   Or   LORazepam (ATIVAN) injection 2 mg  2 mg Intramuscular TID PRN  [name], NP       magnesium hydroxide (MILK OF MAGNESIA) suspension 30 mL  30 mL Oral Daily PRN [name], NP       melatonin tablet 5 mg  5 mg Oral QHS Nkwenti, Doris, NP   5 mg at 01/17/23 2048   nicotine (NICODERM CQ - dosed in mg/24 hours) patch 21 mg  21 mg Transdermal Daily [name], DO   21 mg at 01/18/23 0915   paliperidone (INVEGA) 24 hr tablet 6 mg  6 mg Oral Daily [name], NP   6 mg at 01/17/23 2048   pantoprazole (PROTONIX) EC tablet 40 mg  40 mg Oral Daily [name], NP   40 mg at 01/18/23 0914   polyethylene glycol (MIRALAX / GLYCOLAX) packet 17 g  17 g Oral BID [name], DO   17 g at 01/18/23 0918   sertraline (ZOLOFT) tablet 100 mg  100 mg Oral Daily Park Pope, MD   100 mg at 01/18/23 0914   traZODone (DESYREL) tablet 50 mg  50 mg Oral QHS [name], NP   50 mg at 01/17/23 2048   PTA Medications: Medications Prior to Admission  Medication Sig Dispense Refill Last Dose   busPIRone (BUSPAR) 15 MG tablet Take  15 mg by mouth 2 (two) times daily. (Patient not taking: Reported on 12/19/2022)      paliperidone (INVEGA SUSTENNA) 156 MG/ML SUSY injection Inject 156 mg into the muscle once. (Patient not taking: Reported on 12/19/2022)      sertraline (ZOLOFT) 50 MG tablet Take 150 mg by mouth daily. (Patient not taking: Reported on 12/19/2022)      traZODone (DESYREL) 100 MG tablet Take 100 mg by mouth at bedtime as needed for sleep. (Patient not taking: Reported on 11/12/2022)       Patient Stressors: Medication change or noncompliance    Patient Strengths: Forensic psychologist fund of knowledge   Treatment Modalities: Medication Management, Group therapy, Case management,  1 to 1 session with clinician, Psychoeducation, Recreational therapy.   Physician Treatment Plan for Primary Diagnosis: Schizoaffective disorder, depressive type (HCC) Long Term Goal(s): Improvement in symptoms so as ready for discharge   Short Term Goals: Ability to  identify and develop effective coping behaviors will improve Ability to maintain clinical measurements within normal limits will improve Compliance with prescribed medications will improve Ability to identify triggers associated with substance abuse/mental health issues will improve Ability to identify changes in lifestyle to reduce recurrence of condition will improve Ability to verbalize feelings will improve Ability to disclose and discuss suicidal ideas Ability to demonstrate self-control will improve  Medication Management: Evaluate patient's response, side effects, and tolerance of medication regimen.  Therapeutic Interventions: 1 to 1 sessions, Unit Group sessions and Medication administration.  Evaluation of Outcomes: Progressing  Physician Treatment Plan for Secondary Diagnosis: Principal Problem:   Schizoaffective disorder, depressive type (HCC) Active Problems:   GERD (gastroesophageal reflux disease)   Intellectual disability   Tobacco use disorder  Long Term Goal(s): Improvement in symptoms so as ready for discharge   Short Term Goals: Ability to identify and develop effective coping behaviors will improve Ability to maintain clinical measurements within normal limits will improve Compliance with prescribed medications will improve Ability to identify triggers associated with substance abuse/mental health issues will improve Ability to identify changes in lifestyle to reduce recurrence of condition will improve Ability to verbalize feelings will improve Ability to disclose and discuss suicidal ideas Ability to demonstrate self-control will improve     Medication Management: Evaluate patient's response, side effects, and tolerance of medication regimen.  Therapeutic Interventions: 1 to 1 sessions, Unit Group sessions and Medication administration.  Evaluation of Outcomes: Progressing   RN Treatment Plan for Primary Diagnosis: Schizoaffective disorder, depressive  type (HCC) Long Term Goal(s): Knowledge of disease and therapeutic regimen to maintain health will improve  Short Term Goals: Ability to remain free from injury will improve, Ability to verbalize frustration and anger appropriately will improve, Ability to demonstrate self-control, Ability to participate in decision making will improve, Ability to verbalize feelings will improve, Ability to disclose and discuss suicidal ideas, Ability to identify and develop effective coping behaviors will improve, and Compliance with prescribed medications will improve  Medication Management: RN will administer medications as ordered by provider, will assess and evaluate patient's response and provide education to patient for prescribed medication. RN will report any adverse and/or side effects to prescribing provider.  Therapeutic Interventions: 1 on 1 counseling sessions, Psychoeducation, Medication administration, Evaluate responses to treatment, Monitor vital signs and CBGs as ordered, Perform/monitor CIWA, COWS, AIMS and Fall Risk screenings as ordered, Perform wound care treatments as ordered.  Evaluation of Outcomes: Progressing   LCSW Treatment Plan for Primary Diagnosis: Schizoaffective disorder, depressive type (HCC)  Long Term Goal(s): Safe transition to appropriate next level of care at discharge, Engage patient in therapeutic group addressing interpersonal concerns.  Short Term Goals: Engage patient in aftercare planning with referrals and resources, Increase social support, Increase ability to appropriately verbalize feelings, Increase emotional regulation, Facilitate acceptance of mental health diagnosis and concerns, Facilitate patient progression through stages of change regarding substance use diagnoses and concerns, Identify triggers associated with mental health/substance abuse issues, and Increase skills for wellness and recovery  Therapeutic Interventions: Assess for all discharge needs, 1 to 1  time with Social worker, Explore available resources and support systems, Assess for adequacy in community support network, Educate family and significant other(s) on suicide prevention, Complete Psychosocial Assessment, Interpersonal group therapy.  Evaluation of Outcomes: Progressing   Progress in Treatment: Attending groups: Yes. Participating in groups: Yes. Taking medication as prescribed: Yes. Toleration medication: Yes. Family/Significant other contact made: Yes, individual(s) contacted:  Mother Patient understands diagnosis: Yes. Discussing patient identified problems/goals with staff: Yes. Medical problems stabilized or resolved: Yes. Denies suicidal/homicidal ideation: Yes. Issues/concerns per patient self-inventory: Yes. Other: N/A  New problem(s) identified: No, Describe:  None reported  New Short Term/Long Term Goal(s): medication stabilization, elimination of SI thoughts, development of comprehensive mental wellness plan.   Patient Goals:  Coping Skills  Discharge Plan or Barriers: Patient is awaiting appropriate placement CSW will continue to follow and assess for appropriate referrals and possible discharge planning.   Reason for Continuation of Hospitalization: Anxiety Depression Hallucinations Medication stabilization Suicidal ideation  Estimated Length of Stay: Unknown  Last 3 [place] Suicide Severity Risk Score: Flowsheet Row Ad[facility] (Current) [facility] from 12/19/2022 in [facility] from 11/12/2022 in [facility] Emergency Department at [facility]  C-SSRS RISK CATEGORY High Risk High Risk Moderate Risk       Last [facility] 2/9 Scores:     No data to display           medication stabilization, elimination of SI thoughts, development of comprehensive mental wellness plan.   Scribe for Treatment Team: Ane Payment, LCSW 01/18/2023 2:56  PM

## 2023-01-18 NOTE — Progress Notes (Signed)
   01/17/23 2300  Psych Admission Type (Psych Patients Only)  Admission Status Voluntary  Psychosocial Assessment  Patient Complaints Depression  Eye Contact Brief  Facial Expression Flat  Affect Appropriate to circumstance  Speech Logical/coherent  Interaction Assertive  Motor Activity Slow  Appearance/Hygiene Unremarkable  Behavior Characteristics Cooperative;Appropriate to situation  Mood Depressed  Thought Process  Coherency WDL  Content WDL  Delusions None reported or observed  Perception WDL  Hallucination None reported or observed  Judgment Poor  Confusion None  Danger to Self  Current suicidal ideation? Denies  Self-Injurious Behavior No self-injurious ideation or behavior indicators observed or expressed   Agreement Not to Harm Self Yes  Description of Agreement verbal  Danger to Others  Danger to Others None reported or observed

## 2023-01-18 NOTE — Plan of Care (Signed)
  Problem: Activity: Goal: Interest or engagement in leisure activities will improve Outcome: Progressing   Problem: Coping: Goal: Coping ability will improve Outcome: Progressing   Problem: Safety: Goal: Ability to disclose and discuss suicidal ideas will improve 01/18/2023 1933 by Sherryl Manges, RN Outcome: Progressing 01/18/2023 1922 by Sherryl Manges, RN Outcome: Progressing   Problem: Activity: Goal: Interest or engagement in leisure activities will improve Outcome: Progressing   Problem: Coping: Goal: Coping ability will improve Outcome: Progressing   Problem: Medication: Goal: Compliance with prescribed medication regimen will improve Outcome: Progressing

## 2023-01-18 NOTE — Group Note (Signed)
Recreation Therapy Group Note   Group Topic:Problem Solving  Group Date: 01/18/2023 Start Time: 0930 End Time: 1010 Facilitators: Asir Bingley-McCall, LRT,CTRS Location: 300 [address] Dayroom   Goal Area(s) Addresses:  Patient will effectively work with peer towards shared goal.  Patient will identify skills used to make activity successful.  Patient will identify how skills used during activity can be used to reach post d/c goals.    Group Description: Landing Pad. In teams of 3-5, patients were given 12 plastic drinking straws and an equal length of masking tape. Using the materials provided, patients were asked to build a landing pad to catch a golf ball dropped from approximately 5 feet in the air. All materials were required to be used by the team in their design. LRT facilitated post-activity discussion.   Affect/Mood: Flat   Participation Level: Active   Participation Quality: Independent   Behavior: Appropriate   Speech/Thought Process: Barely audible    Insight: Fair   Judgement: Fair    Modes of Intervention: STEM Activity   Patient Response to Interventions:  Attentive   Education Outcome:  Acknowledges education   Clinical Observations/Individualized Feedback: Pt attended and gave minimal participation.   Plan: Continue to engage patient in RT group sessions 2-3x/week.   Boysie Bonebrake-McCall, LRT,CTRS 01/18/2023 1:32 PM

## 2023-01-18 NOTE — Plan of Care (Signed)
  Problem: Activity: Goal: Interest or engagement in leisure activities will improve Outcome: Progressing   Problem: Coping: Goal: Coping ability will improve Outcome: Progressing   Problem: Education: Goal: Knowledge of the prescribed therapeutic regimen will improve Outcome: Progressing

## 2023-01-18 NOTE — Plan of Care (Signed)
  Problem: Safety: Goal: Ability to disclose and discuss suicidal ideas will improve Outcome: Progressing   Problem: Activity: Goal: Interest or engagement in leisure activities will improve Outcome: Progressing   Problem: Coping: Goal: Coping ability will improve Outcome: Progressing   Problem: Medication: Goal: Compliance with prescribed medication regimen will improve Outcome: Progressing  Pt A & O X3. Denies SI, HI, AVH and pain when assessed "not right now". Reports she slept well with good appetite.  Awake in dayroom most of this shift, attended scheduled groups. Went off unit for meals, returned without issues. Remains medication compliant, denies adverse drug reactions when assessed.   Support, encouragement and reassurance offered. Q 15 minutes safety checks and falls precaution maintained without incident.

## 2023-01-19 DIAGNOSIS — F251 Schizoaffective disorder, depressive type: Secondary | ICD-10-CM | POA: Diagnosis not present

## 2023-01-19 NOTE — Progress Notes (Signed)
   01/19/23 2213  Psych Admission Type (Psych Patients Only)  Admission Status Voluntary  Psychosocial Assessment  Patient Complaints Depression  Eye Contact Brief  Facial Expression Anxious  Affect Appropriate to circumstance  Speech Logical/coherent;Soft  Interaction Childlike  Motor Activity Slow  Appearance/Hygiene Poor hygiene;Disheveled  Behavior Characteristics Appropriate to situation  Mood Pleasant  Thought Process  Coherency WDL  Content WDL  Delusions None reported or observed  Perception WDL  Hallucination None reported or observed  Judgment Limited  Confusion None  Danger to Self  Current suicidal ideation? Denies  Self-Injurious Behavior No self-injurious ideation or behavior indicators observed or expressed   Agreement Not to Harm Self Yes  Description of Agreement verbal  Danger to Others  Danger to Others None reported or observed

## 2023-01-19 NOTE — [facility] Group Notes (Signed)
Adult Psychoeducational Group Note  Date:  01/19/2023 Time:  9:06 PM  Group Topic/Focus:  Wrap-Up Group:   The focus of this group is to help patients review their daily goal of treatment and discuss progress on daily workbooks.  Participation Level:  Did Not Attend  Participation Quality:   Did not attend  Affect:   Did not attend  Cognitive:   Did not attend  Insight: None  Engagement in Group:   Did not attend  Modes of Intervention:   Did not attend  Additional Comments:  Pt was in bed asleep.  Keiko Myricks, Sharen Counter 01/19/2023, 9:06 PM

## 2023-01-19 NOTE — Progress Notes (Signed)
   01/19/23 0558  15 Minute Checks  Location Bedroom  Visual Appearance Calm  Behavior Sleeping  Sleep (Behavioral Health Patients Only)  Calculate sleep? (Click Yes once per 24 hr at 0600 safety check) Yes  Documented sleep last 24 hours 8.75

## 2023-01-19 NOTE — Progress Notes (Signed)
Patient ID: Yolanda [name], female   DOB: 1975/07/29, 47 y.o.   MRN: [id number] [facility] MD Progress Note  01/19/2023 2:14 PM Yolanda [name]  MRN:  [id number]  Reason for admission: This is the first psychiatric admission in this [name] in 12 years for this 21 AA female with an extensive hx of mental illnesses & probable polysubstance use disorders. She is admitted to the [facility] from the [facility] hospital with complain of worsening suicidal ideations with plan to stab herself. Per chart review, patient apparently reported at the ED that she has been depressed for a while & has not been taking her mental health medications. After medical evaluation.clearance, she was transferred to the [facility] for further psychiatric evaluation/treatments.  APS report on 6/24 Has payee, no legal guardian Continues to await placement  Overnight events:  V/S WNL. Med compliant. Given agitation protocol medication today afternoon at 1211 (Ativan, Benadryl & Haldol), still awaiting documentation from nursing as to why this medication was warranted.   Daily notes: [name] is seen, chart reviewed. The chart findings discussed with the treatment team. She presents alert, oriented to her name/situation.  She is making a minimal eye contact. However, eye contact will improve momentarily when patient is prompted to do so. She presents today with her face down. When asked if she is okay", patient reports, "I'm not okay. I did not sleep too good last night because I had to get up & used the bathroom. I'm still hearing the voices , saying not good stuff about me. I tried to not listen to the voices, but they keep talking. It makes me sad". [name] continues to require frequent redirection in her thinking pattern. She requires encouragement to come out of her room & attend group sessions. She continues to require redirection to maintain personal care/hygiene. She remains on her current plan of care as already in progress. The SW continues  to work on her discharge disposition plan. Continue current plan of care as already. This case is discussed with the attending psychiatrist.  Principal Problem: Schizoaffective disorder, depressive type (HCC)   Diagnosis: Principal Problem:   Schizoaffective disorder, depressive type (HCC) Active Problems:   GERD (gastroesophageal reflux disease)   Intellectual disability   Tobacco use disorder  Past Psychiatric History: See H&P.  Past Medical History:  Past Medical History:  Diagnosis Date   Bipolar affect, depressed (HCC)    Constipation 08/17/2022   Depression    Falls 07/21/2022   Fracture of femoral neck, right, closed (HCC) 01/17/2022   Herpes simplex 08/22/2017   Open fracture dislocation of right elbow joint 01/17/2022    Past Surgical History:  Procedure Laterality Date   NO PAST SURGERIES     SALPINGECTOMY     Family History: History reviewed. No pertinent family history.  Family Psychiatric  History: See H&P.  Social History:  Social History   Substance and Sexual Activity  Alcohol Use Yes     Social History   Substance and Sexual Activity  Drug Use Yes   Types: Cocaine, Marijuana    Social History   Socioeconomic History   Marital status: Single    Spouse name: Not on file   Number of children: Not on file   Years of education: Not on file   Highest education level: Not on file  Occupational History   Not on file  Tobacco Use   Smoking status: Every Day   Smokeless tobacco: Not on file  Substance and Sexual Activity  Alcohol use: Yes   Drug use: Yes    Types: Cocaine, Marijuana   Sexual activity: Yes  Other Topics Concern   Not on file  Social History Narrative   Not on file   Social Determinants of Health   Financial Resource Strain: Low Risk  (09/12/2022)   Received from [facility]   Overall Financial Resource Strain (CARDIA)    Difficulty of Paying Living Expenses: Not hard at all  Food Insecurity: Patient Declined (12/20/2022)    Hunger Vital Sign    Worried About Running Out of Food in the Last Year: Patient declined    Ran Out of Food in the Last Year: Patient declined  Transportation Needs: No Transportation Needs (12/20/2022)   PRAPARE - Administrator, Civil Service (Medical): No    Lack of Transportation (Non-Medical): No  Physical Activity: Not on file  Stress: No Stress Concern Present (07/17/2022)   Received from [facility] Occupational [facility] - Occupational Stress Questionnaire    Feeling of Stress : Not at all  Social Connections: Unknown (07/16/2022)   Received from [facility]   Social Network    Social Network: Not on file   Current Medications: Current Facility-Administered Medications  Medication Dose Route Frequency Provider Last Rate Last Admin   acetaminophen (TYLENOL) tablet 650 mg  650 mg Oral Q6H PRN [name], NP   650 mg at 12/29/22 1600   alum & mag hydroxide-simeth (MAALOX/MYLANTA) 200-200-20 MG/5ML suspension 30 mL  30 mL Oral Q4H PRN [name], NP       busPIRone (BUSPAR) tablet 15 mg  15 mg Oral BID [name], NP   15 mg at 01/19/23 1610   cyanocobalamin (VITAMIN B12) injection 1,000 mcg  1,000 mcg Intramuscular Weekly Attiah, Nadir, MD   1,000 mcg at 01/19/23 1244   Followed by   Melene Muller ON 02/02/2023] cyanocobalamin (VITAMIN B12) injection 1,000 mcg  1,000 mcg Intramuscular Q30 days [name], Nadir, MD       diphenhydrAMINE (BENADRYL) capsule 50 mg  50 mg Oral TID PRN [name], NP   50 mg at 01/15/23 1211   Or   diphenhydrAMINE (BENADRYL) injection 50 mg  50 mg Intramuscular TID PRN [name], NP       feeding supplement (ENSURE ENLIVE / ENSURE PLUS) liquid 237 mL  237 mL Oral TID BM Nkwenti, Doris, NP   237 mL at 01/19/23 1257   haloperidol (HALDOL) tablet 5 mg  5 mg Oral TID PRN [name], NP   5 mg at 01/15/23 1211   Or   haloperidol lactate (HALDOL) injection 5 mg  5 mg Intramuscular TID PRN [name], NP        hydrOXYzine (ATARAX) tablet 25 mg  25 mg Oral TID PRN [name], DO   25 mg at 01/18/23 1411   LORazepam (ATIVAN) tablet 2 mg  2 mg Oral TID PRN [name], NP   2 mg at 01/15/23 1211   Or   LORazepam (ATIVAN) injection 2 mg  2 mg Intramuscular TID PRN [name], NP       magnesium hydroxide (MILK OF MAGNESIA) suspension 30 mL  30 mL Oral Daily PRN [name], NP       melatonin tablet 5 mg  5 mg Oral QHS Nkwenti, Doris, NP   5 mg at 01/18/23 2137   nicotine (NICODERM CQ - dosed in mg/24 hours) patch 21 mg  21 mg  Transdermal Daily [name], DO   21 mg at 01/18/23 0915   paliperidone (INVEGA) 24 hr tablet 6 mg  6 mg Oral Daily [name], NP   6 mg at 01/18/23 2137   pantoprazole (PROTONIX) EC tablet 40 mg  40 mg Oral Daily [name], NP   40 mg at 01/19/23 0817   polyethylene glycol (MIRALAX / GLYCOLAX) packet 17 g  17 g Oral BID [name], DO   17 g at 01/19/23 0818   sertraline (ZOLOFT) tablet 100 mg  100 mg Oral Daily Park Pope, MD   100 mg at 01/19/23 0817   traZODone (DESYREL) tablet 50 mg  50 mg Oral QHS [name], NP   50 mg at 01/18/23 2137   Lab Results:  No results found for this or any previous visit (from the past 48 hour(s)).   Blood Alcohol level:  Lab Results  Component Value Date   ETH <10 12/19/2022   ETH <10 11/21/2019   Metabolic Disorder Labs: Lab Results  Component Value Date   HGBA1C 4.4 (L) 12/21/2022   MPG 79.58 12/21/2022   No results found for: "PROLACTIN" Lab Results  Component Value Date   CHOL 218 (H) 12/21/2022   TRIG 81 12/21/2022   HDL 53 12/21/2022   CHOLHDL 4.1 12/21/2022   VLDL 16 12/21/2022   LDLCALC 149 (H) 12/21/2022   LDLCALC 110 (H) 03/13/2011   Physical Findings: AIMS: Facial and Oral Movements Muscles of Facial Expression: None, normal Lips and Perioral Area: None, normal Jaw: None, normal Tongue: None, normal,Extremity Movements Upper (arms, wrists, hands, fingers): None, normal Lower  (legs, knees, ankles, toes): None, normal, Trunk Movements Neck, shoulders, hips: None, normal, Overall Severity Severity of abnormal movements (highest score from questions above): None, normal Incapacitation due to abnormal movements: None, normal Patient's awareness of abnormal movements (rate only patient's report): No Awareness, Dental Status Current problems with teeth and/or dentures?: No Does patient usually wear dentures?: No   Musculoskeletal: Strength & Muscle Tone: within normal limits Gait & Station: normal Patient leans: N/A  Psychiatric Specialty Exam:  Presentation  General Appearance:  Lying down in bed, better hygiene noted  Eye Contact: Fair Speech: -- (Mildly garbled)  Speech Volume: Decreased  Handedness: Right  Mood and Affect  Mood: -- ("Not good")  Affect: Congruent; Flat  Thought Process  Thought Processes: Disorganized  Descriptions of Associations:Tangential  Orientation:Full (Time, Place and Person)  Thought Content: Denies SI HI or AVH does not present responding to stimuli. Presentation consistent with moderate intellectual disability  History of Schizophrenia/Schizoaffective disorder:Yes  Duration of Psychotic Symptoms:Greater than six months  Hallucinations:Hallucinations: Auditory Description of Auditory Hallucinations: "The voices are saying not so good stuff about me".    Ideas of Reference:None  Suicidal Thoughts:Suicidal Thoughts: No SI Active Intent and/or Plan: Without Intent; Without Plan; Without Means to Carry Out; Without Access to Means    Homicidal Thoughts:Homicidal Thoughts: No    Sensorium  Memory: Immediate Fair; Recent Fair; Remote Fair  Judgment: Limited Insight: Limited Executive Functions  Concentration: Fair  Attention Span: Fair  Recall: Fair  Fund of Knowledge: Poor  Language: Fair  Psychomotor Activity  Psychomotor Activity: Psychomotor Activity: Decreased   Assets   Assets: Communication Skills; Desire for Improvement; Financial Resources/Insurance; Resilience  Sleep  Sleep: Sleep: Fair (Per staff reports, patient slept for 8 hours.) Number of Hours of Sleep: 8   Physical Exam: Physical Exam Vitals and nursing note reviewed.  Constitutional:  General: She is not in acute distress.    Appearance: She is not ill-appearing or diaphoretic.  HENT:     Head: Normocephalic.     Nose: No congestion.     Mouth/Throat:     Pharynx: Oropharynx is clear.  Eyes:     Pupils: Pupils are equal, round, and reactive to light.  Pulmonary:     Effort: Pulmonary effort is normal. No respiratory distress.  Genitourinary:    Comments: Deferred Musculoskeletal:        General: Normal range of motion.     Cervical back: Normal range of motion.  Skin:    General: Skin is warm and dry.  Neurological:     General: No focal deficit present.     Mental Status: She is alert.    Blood pressure 94/70, pulse 80, temperature 97.7 F (36.5 C), temperature source Oral, resp. rate 20, height 4\' 11"  (1.499 m), weight 63 kg, SpO2 100%. Body mass index is 28.05 kg/m.  Treatment Plan Summary: Daily contact with patient to assess and evaluate symptoms and progress in treatment and Medication management.   Still waiting on safe disposition as patient would not be able to live independently given cognitive impairment. Recommend assisted living.   No medication side effects.  Because of urinary incontinence, recommended adult diapers to be worn.  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  Plan:  -Continue Buspar 15 mg po bid for anxiety.  -Continue paliperidone 6 mg po qd for mood  stabilization -Continued Sertraline 100 mg po Q daily for depression/anxiety -Continue Trazodone 50 mg po Q bedtime prn for insomnia -Continue Nicoderm topically Q 24 hrs for nicotine withdrawal management -Continue vitamin B12 1000 mcg IM weekly for 4 weeks then monthly -Continue Melatonin 5 mg nightly for sleep -Continue Ensure nutritional shakes TID in between meals  -Previously discontinued Hydroxyzine 50 mg nightly-hypotension in the mornings  Lab work and EKG, reviewed with no significant abnormalities noted, exc ept slightly low b12 166.  Safety and Monitoring: Voluntary admission to inpatient psychiatric unit for safety, stabilization and treatment Daily contact with patient to assess and evaluate symptoms and progress in treatment Patient's case to be discussed in multi-disciplinary team meeting Observation Level : q15 minute checks Vital signs: q12 hours Precautions: Safety   Discharge Planning: Social work and case management to assist with discharge planning and identification of hospital follow-up needs prior to discharge Estimated LOS: Unknown at this time. Discharge Concerns: Need to establish a safety plan; Medication compliance and effectiveness Discharge Goals: Return home with outpatient referrals for mental health follow-up including medication management/psychotherapy No legal guardian, has payee  Armandina Stammer, NP, pmhnp, fnp-bc. 01/19/2023, 2:14 PM Patient ID: Larey Brick, female   DOB: [birth year], 47 y.o.    MRN: [id number] Patient ID: SHANTIKA ACCARDO, female   DOB: May 31, 1976, 47 y.o.   MRN: [id number] Patient ID: ROZLYN CHUBA, female   DOB: May 02, 1976, 47 y.o.   MRN: [id number] Patient ID: SIDNEE LOOSE, female   DOB: 02/20/1976, 47 y.o.   MRN: [id number] Patient ID: LIBNY BLADEN, female   DOB: 08-Nov-1975, 47 y.o.   MRN: [id number] Patient ID: JASONNA VANROSSUM, female   DOB: 03-20-1976, 47 y.o.   MRN: [id number]

## 2023-01-19 NOTE — Progress Notes (Signed)
   01/19/23 1000  Psych Admission Type (Psych Patients Only)  Admission Status Voluntary  Psychosocial Assessment  Patient Complaints Depression  Eye Contact Brief  Facial Expression Anxious  Affect Appropriate to circumstance  Speech Soft  Interaction Childlike  Motor Activity Slow  Appearance/Hygiene Poor hygiene  Behavior Characteristics Cooperative;Appropriate to situation  Mood Depressed  Thought Process  Coherency WDL  Content WDL  Delusions None reported or observed  Perception WDL  Hallucination None reported or observed  Judgment Poor  Confusion None  Danger to Self  Current suicidal ideation? Denies  Self-Injurious Behavior No self-injurious ideation or behavior indicators observed or expressed   Agreement Not to Harm Self Yes  Description of Agreement verbal contract for safety  Danger to Others  Danger to Others None reported or observed

## 2023-01-19 NOTE — [facility] Group Notes (Signed)
[facility] Group Notes:  (Nursing/MHT/Case Management/Adjunct)  Date:  01/19/2023  Time:  2:19 PM  Type of Therapy:  Psychoeducational Skills  Participation Level:  Active  Participation Quality:  Appropriate  Affect:  Appropriate  Cognitive:  Alert and Appropriate  Insight:  Appropriate  Engagement in Group:  Engaged  Modes of Intervention:  Discussion, Education, and Exploration  Summary of Progress/Problems: Psychoeducational group with RN in which patients were given two poems to use, one '' The owl and the chimpanzee'' and ''watch your thoughts they become actions '' . Both poems focus on identifying negative behavioral actions and identifying healthy coping skills healthy behaviors to help incorporate for improved mental health.   Malva Limes 01/19/2023, 2:19 PM

## 2023-01-19 NOTE — Group Note (Signed)
LCSW Group Therapy Note  Group Date: 01/19/2023 Start Time: 1000 End Time: 1100   Type of Therapy and Topic:  Group Therapy: Anger Cues and Responses  Participation Level:  Minimal   Description of Group:   In this group, patients learned how to recognize the physical, cognitive, emotional, and behavioral responses they have to anger-provoking situations.  They identified a recent time they became angry and how they reacted.  They analyzed how their reaction was possibly beneficial and how it was possibly unhelpful.  The group discussed a variety of healthier coping skills that could help with such a situation in the future.  Focus was placed on how helpful it is to recognize the underlying emotions to our anger, because working on those can lead to a more permanent solution as well as our ability to focus on the important rather than the urgent.  Therapeutic Goals: Patients will remember their last incident of anger and how they felt emotionally and physically, what their thoughts were at the time, and how they behaved. Patients will identify how their behavior at that time worked for them, as well as how it worked against them. Patients will explore possible new behaviors to use in future anger situations. Patients will learn that anger itself is normal and cannot be eliminated, and that healthier reactions can assist with resolving conflict rather than worsening situations.  Summary of Patient Progress:  Yolanda [name] was minimally active during the group, answering questions posed directly to her and sitting in the room without appearing to have any reactions to the discussion. She shared a recent occurrence wherein feeling upset due to racing thoughts led to anger and crying. She demonstrated limited insight into the subject matter, was respectful of peers, and remained present throughout the entire session.  Therapeutic Modalities:   Cognitive Behavioral Therapy    Lynnell Chad,  LCSWA 01/19/2023  11:17 AM

## 2023-01-19 NOTE — Group Note (Signed)
Date:  01/19/2023 Time:  6:28 PM  Group Topic/Focus:  Goals Group:   The focus of this group is to help patients establish daily goals to achieve during treatment and discuss how the patient can incorporate goal setting into their daily lives to aide in recovery. Orientation:   The focus of this group is to educate the patient on the purpose and policies of crisis stabilization and provide a format to answer questions about their admission.  The group details unit policies and expectations of patients while admitted.    Participation Level:  Did Not Attend  Participation Quality:   n/a  Affect:   n/a  Cognitive:   n/a  Insight: None  Engagement in Group:   n/a  Modes of Intervention:   n/a  Additional Comments:   Pt did not attend.  Edmund Hilda Artur Winningham 01/19/2023, 6:28 PM

## 2023-01-19 NOTE — Plan of Care (Signed)
  Problem: Education: Goal: Utilization of techniques to improve thought processes will improve Outcome: Not Progressing Goal: Knowledge of the prescribed therapeutic regimen will improve Outcome: Progressing   

## 2023-01-19 NOTE — Group Note (Signed)
Date:  01/19/2023 Time:  6:40 PM  Group Topic/Focus:  Social Wellness    Participation Level:  Did Not Attend  Participation Quality:   n/a  Affect:   n/a  Cognitive:   n/a  Insight: None  Engagement in Group:   n/a  Modes of Intervention:   n/a  Additional Comments:   Pt did not attend.  Yolanda [name] 01/19/2023, 6:40 PM

## 2023-01-19 NOTE — Group Note (Signed)
Date:  01/19/2023 Time:  5:54 AM  Group Topic/Focus:  Wrap-Up Group:   The focus of this group is to help patients review their daily goal of treatment and discuss progress on daily workbooks.  Pt did not attend AA group.   Osa Craver 01/19/2023, 5:54 AM

## 2023-01-20 DIAGNOSIS — F251 Schizoaffective disorder, depressive type: Secondary | ICD-10-CM | POA: Diagnosis not present

## 2023-01-20 NOTE — Group Note (Signed)
Date:  01/20/2023 Time:  10:07 AM  Group Topic/Focus:  Goals Group:   The focus of this group is to help patients establish daily goals to achieve during treatment and discuss how the patient can incorporate goal setting into their daily lives to aide in recovery.    Participation Level:  Active  Participation Quality:  Appropriate  Affect:  Appropriate  Cognitive:  Appropriate  Insight: Appropriate  Engagement in Group:  Engaged  Modes of Intervention:  Education  Additional Comments:  PT participated in positive affrication discussion   Gwinda Maine 01/20/2023, 10:07 AM

## 2023-01-20 NOTE — Plan of Care (Signed)
  Problem: Education: Goal: Utilization of techniques to improve thought processes will improve Outcome: Not Progressing   Problem: Activity: Goal: Interest or engagement in leisure activities will improve Outcome: Progressing Goal: Imbalance in normal sleep/wake cycle will improve Outcome: Progressing   Problem: Coping: Goal: Will verbalize feelings Outcome: Progressing

## 2023-01-20 NOTE — Progress Notes (Signed)
Patient ID: Yolanda [name], female   DOB: 12-29-1975, 47 y.o.   MRN: [id number] [facility] MD Progress Note  01/20/2023 2:12 PM Yolanda [name]  MRN:  [id number]  Reason for admission: This is the first psychiatric admission in this [facility] in 12 years for this 54 AA female with an extensive hx of mental illnesses & probable polysubstance use disorders. She is admitted to the [facility] from the [facility] hospital with complain of worsening suicidal ideations with plan to stab herself. Per chart review, patient apparently reported at the ED that she has been depressed for a while & has not been taking her mental health medications. After medical evaluation.clearance, she was transferred to the [facility] for further psychiatric evaluation/treatments.  APS report on 6/24 Has payee, no legal guardian Continues to await placement  Daily notes: Yolanda [name] is seen in her room. Chart reviewed. The chart findings discussed with the treatment team. She presents alert, oriented & aware of situation. She is visible on the unit, attends group sessions. However, she needs reminders & encouragement to come out of her room most of the time to attend group sessions or sit in the day room with the other patients. [name] remains disheveled, eye contact remains poor to fair. Her speech is garbled. She reports today while crying, "I'm upset with myself. I'm not like you guys". When asked to explain to this provider, what she meant by "I'm not like you guys?" She replied, "I hear voices & people make fun of me". When asked if she did hear any voices last night or this morning & if anyone has made fun of her today or even last night? she replied, "no". Patient did lighten-up when discussing her children with this provider. Says, "I did not know it at the time, but I did not tell my girls that they were beautiful & smart.". Patient is encouraged to tell her girls that they are beautiful & smart the next time she talked to them. She replied "okay".  Yolanda [name] currently denies any SIHI, AVH, delusional thoughts or paranoia. She does not appear to be responding to any internal stimuli. There are no changes made on her current plan of care. Will continue as already in progress. Vital signs remain stable.  Principal Problem: Schizoaffective disorder, depressive type (HCC)   Diagnosis: Principal Problem:   Schizoaffective disorder, depressive type (HCC) Active Problems:   GERD (gastroesophageal reflux disease)   Intellectual disability   Tobacco use disorder  Past Psychiatric History: See H&P.  Past Medical History:  Past Medical History:  Diagnosis Date   Bipolar affect, depressed (HCC)    Constipation 08/17/2022   Depression    Falls 07/21/2022   Fracture of femoral neck, right, closed (HCC) 01/17/2022   Herpes simplex 08/22/2017   Open fracture dislocation of right elbow joint 01/17/2022    Past Surgical History:  Procedure Laterality Date   NO PAST SURGERIES     SALPINGECTOMY     Family History: History reviewed. No pertinent family history.  Family Psychiatric  History: See H&P.  Social History:  Social History   Substance and Sexual Activity  Alcohol Use Yes     Social History   Substance and Sexual Activity  Drug Use Yes   Types: Cocaine, Marijuana    Social History   Socioeconomic History   Marital status: Single    Spouse name: Not on file   Number of children: Not on file   Years of education: Not on file  Highest education level: Not on file  Occupational History   Not on file  Tobacco Use   Smoking status: Every Day   Smokeless tobacco: Not on file  Substance and Sexual Activity   Alcohol use: Yes   Drug use: Yes    Types: Cocaine, Marijuana   Sexual activity: Yes  Other Topics Concern   Not on file  Social History Narrative   Not on file   Social Determinants of Health   Financial Resource Strain: Low Risk  (09/12/2022)   Received from [facility]   Overall Financial Resource Strain  (CARDIA)    Difficulty of Paying Living Expenses: Not hard at all  Food Insecurity: Patient Declined (12/20/2022)   Hunger Vital Sign    Worried About Running Out of Food in the Last Year: Patient declined    Ran Out of Food in the Last Year: Patient declined  Transportation Needs: No Transportation Needs (12/20/2022)   PRAPARE - Administrator, Civil Service (Medical): No    Lack of Transportation (Non-Medical): No  Physical Activity: Not on file  Stress: No Stress Concern Present (07/17/2022)   Received from [name] - Occupational Stress Questionnaire    Feeling of Stress : Not at all  Social Connections: Unknown (07/16/2022)   Received from [facility]   Social Network    Social Network: Not on file   Current Medications: Current Facility-Administered Medications  Medication Dose Route Frequency Provider Last Rate Last Admin   acetaminophen (TYLENOL) tablet 650 mg  650 mg Oral Q6H PRN [name], NP   650 mg at 01/19/23 1855   alum & mag hydroxide-simeth (MAALOX/MYLANTA) 200-200-20 MG/5ML suspension 30 mL  30 mL Oral Q4H PRN [name], NP       busPIRone (BUSPAR) tablet 15 mg  15 mg Oral BID [name], NP   15 mg at 01/20/23 4696   cyanocobalamin (VITAMIN B12) injection 1,000 mcg  1,000 mcg Intramuscular Weekly Attiah, Nadir, MD   1,000 mcg at 01/19/23 1244   Followed by   Melene Muller ON 02/02/2023] cyanocobalamin (VITAMIN B12) injection 1,000 mcg  1,000 mcg Intramuscular Q30 days [name], Nadir, MD       diphenhydrAMINE (BENADRYL) capsule 50 mg  50 mg Oral TID PRN [name], NP   50 mg at 01/15/23 1211   Or   diphenhydrAMINE (BENADRYL) injection 50 mg  50 mg Intramuscular TID PRN [name], NP       feeding supplement (ENSURE ENLIVE / ENSURE PLUS) liquid 237 mL  237 mL Oral TID BM Nkwenti, Doris, NP   237 mL at 01/20/23 1107   haloperidol (HALDOL) tablet 5 mg  5 mg Oral TID PRN [name], NP   5 mg at  01/15/23 1211   Or   haloperidol lactate (HALDOL) injection 5 mg  5 mg Intramuscular TID PRN [name], NP       hydrOXYzine (ATARAX) tablet 25 mg  25 mg Oral TID PRN [name], DO   25 mg at 01/18/23 1411   LORazepam (ATIVAN) tablet 2 mg  2 mg Oral TID PRN [name], NP   2 mg at 01/15/23 1211   Or   LORazepam (ATIVAN) injection 2 mg  2 mg Intramuscular TID PRN [name], NP       magnesium hydroxide (MILK OF MAGNESIA) suspension 30 mL  30 mL Oral Daily PRN [name], NP  melatonin tablet 5 mg  5 mg Oral QHS Nkwenti, Doris, NP   5 mg at 01/19/23 2040   nicotine (NICODERM CQ - dosed in mg/24 hours) patch 21 mg  21 mg Transdermal Daily [name], DO   21 mg at 01/18/23 0915   paliperidone (INVEGA) 24 hr tablet 6 mg  6 mg Oral Daily Nkwenti, Tyler Aas, NP   6 mg at 01/19/23 2040   pantoprazole (PROTONIX) EC tablet 40 mg  40 mg Oral Daily [name], NP   40 mg at 01/20/23 0806   polyethylene glycol (MIRALAX / GLYCOLAX) packet 17 g  17 g Oral BID [name], DO   17 g at 01/20/23 0805   sertraline (ZOLOFT) tablet 100 mg  100 mg Oral Daily Park Pope, MD   100 mg at 01/20/23 0806   traZODone (DESYREL) tablet 50 mg  50 mg Oral QHS [name], NP   50 mg at 01/19/23 2040   Lab Results:  No results found for this or any previous visit (from the past 48 hour(s)).   Blood Alcohol level:  Lab Results  Component Value Date   ETH <10 12/19/2022   ETH <10 11/21/2019   Metabolic Disorder Labs: Lab Results  Component Value Date   HGBA1C 4.4 (L) 12/21/2022   MPG 79.58 12/21/2022   No results found for: "PROLACTIN" Lab Results  Component Value Date   CHOL 218 (H) 12/21/2022   TRIG 81 12/21/2022   HDL 53 12/21/2022   CHOLHDL 4.1 12/21/2022   VLDL 16 12/21/2022   LDLCALC 149 (H) 12/21/2022   LDLCALC 110 (H) 03/13/2011   Physical Findings: AIMS: Facial and Oral Movements Muscles of Facial Expression: None, normal Lips and Perioral Area: None,  normal Jaw: None, normal Tongue: None, normal,Extremity Movements Upper (arms, wrists, hands, fingers): None, normal Lower (legs, knees, ankles, toes): None, normal, Trunk Movements Neck, shoulders, hips: None, normal, Overall Severity Severity of abnormal movements (highest score from questions above): None, normal Incapacitation due to abnormal movements: None, normal Patient's awareness of abnormal movements (rate only patient's report): No Awareness, Dental Status Current problems with teeth and/or dentures?: No Does patient usually wear dentures?: No   Musculoskeletal: Strength & Muscle Tone: within normal limits Gait & Station: normal Patient leans: N/A  Psychiatric Specialty Exam:  Presentation  General Appearance:  Lying down in bed, better hygiene noted  Eye Contact: Fair Speech: -- (Mildly garbled)  Speech Volume: Decreased  Handedness: Right  Mood and Affect  Mood: -- ("Not good")  Affect: Congruent; Flat  Thought Process  Thought Processes: Disorganized  Descriptions of Associations:Tangential  Orientation:Full (Time, Place and Person)  Thought Content: Denies SI HI or AVH does not present responding to stimuli. Presentation consistent with moderate intellectual disability  History of Schizophrenia/Schizoaffective disorder:Yes  Duration of Psychotic Symptoms:Greater than six months  Hallucinations:Hallucinations: Auditory Description of Auditory Hallucinations: "The voices are saying not so good stuff about me".    Ideas of Reference:None  Suicidal Thoughts:Suicidal Thoughts: No SI Active Intent and/or Plan: Without Intent; Without Plan; Without Means to Carry Out; Without Access to Means    Homicidal Thoughts:Homicidal Thoughts: No    Sensorium  Memory: Immediate Fair; Recent Fair; Remote Fair  Judgment: Limited Insight: Limited Executive Functions  Concentration: Fair  Attention Span: Fair  Recall: Fair  Fund of  Knowledge: Poor  Language: Fair  Psychomotor Activity  Psychomotor Activity: Psychomotor Activity: Decreased   Assets  Assets: Communication Skills; Desire for Improvement; Financial Resources/Insurance; Resilience  Sleep  Sleep: Sleep: Fair (Per staff reports, patient slept for 8 hours.) Number of Hours of Sleep: 8  Physical Exam: Physical Exam Vitals and nursing note reviewed.  Constitutional:      General: She is not in acute distress.    Appearance: She is not ill-appearing or diaphoretic.  HENT:     Head: Normocephalic.     Nose: No congestion.     Mouth/Throat:     Pharynx: Oropharynx is clear.  Eyes:     Pupils: Pupils are equal, round, and reactive to light.  Cardiovascular:     Rate and Rhythm: Normal rate.     Pulses: Normal pulses.  Pulmonary:     Effort: Pulmonary effort is normal. No respiratory distress.  Genitourinary:    Comments: Deferred Musculoskeletal:        General: Normal range of motion.     Cervical back: Normal range of motion.  Skin:    General: Skin is warm and dry.  Neurological:     General: No focal deficit present.     Mental Status: She is alert.    Blood pressure 100/68, pulse 71, temperature 98.5 F (36.9 C), temperature source Oral, resp. rate 20, height 4\' 11"  (1.499 m), weight 63 kg, SpO2 99%. Body mass index is 28.05 kg/m.  Treatment Plan Summary: Daily contact with patient to assess and evaluate symptoms and progress in treatment and Medication management.   Still waiting on safe disposition as patient would not be able to live independently given cognitive impairment. Recommend assisted living.   No medication side effects.  Because of urinary incontinence, recommended adult diapers to be worn.  Principal/active diagnoses:  Principal Problem:   Schizoaffective disorder, depressive type (HCC) Active Problems:   GERD (gastroesophageal reflux disease)   Intellectual disability   Tobacco use disorder (MOCA  16/30) moderate cognitive impairment probably related to moderate intellectual disability.  Plan:  -Continue Buspar 15 mg po bid for anxiety.  -Continue paliperidone 6 mg po qd for mood stabilization -Continued Sertraline 100 mg po Q daily for depression/anxiety -Continue Trazodone 50 mg po Q bedtime prn for insomnia -Continue Nicoderm topically Q 24 hrs for nicotine withdrawal management -Continue vitamin B12 1000 mcg IM weekly for 4 weeks then monthly -Continue Melatonin 5 mg nightly for sleep -Continue Ensure nutritional shakes TID in between meals  -Previously discontinued Hydroxyzine 50 mg nightly-hypotension in the mornings  Lab work and EKG, reviewed with no significant abnormalities noted, except slightly low b12 166.  Safety and Monitoring: Voluntary admission to inpatient psychiatric unit for safety, stabilization and treatment Daily contact with patient to assess and evaluate symptoms and progress in treatment Patient's case to be discussed in multi-disciplinary team meeting Observation Level : q15 minute checks Vital signs: q12 hours Precautions: Safety   Discharge Planning: Social work and case management to assist with discharge planning and identification of hospital follow-up needs prior to discharge Estimated LOS: Unknown at this time. Discharge Concerns: Need to establish a safety plan; Medication compliance and effectiveness Discharge Goals: Return home with outpatient referrals for mental health follow-up including medication management/psychotherapy No legal guardian, has payee  Armandina Stammer, NP, pmhnp, fnp-bc. 01/20/2023, 2:12 PM Patient ID: Larey Brick, female   DOB: 18-Oct-1975, 47 y.o.    MRN: [id number] Patient ID: AZHANE HANSLER, female   DOB: [birth year], 47 y.o.   MRN: [id number] Patient ID: JAHAYRA FREITAS, female   DOB: 1975/09/05, 47 y.o.   MRN: [id number] Patient ID: Bruna Oneal Grout,  female   DOB: [birth year], 47 y.o.   MRN: [id number] Patient  ID: NATEESHA KORIN, female   DOB: 04-26-1976, 47 y.o.   MRN: [id number] Patient ID: CLAYRE BRANDY, female   DOB: [birth year], 47 y.o.   MRN: [id number] Patient ID: DESMONA SORN, female   DOB: 05-11-1976, 47 y.o.   MRN: [id number]

## 2023-01-20 NOTE — Plan of Care (Signed)
  Problem: Education: Goal: Knowledge of the prescribed therapeutic regimen will improve Outcome: Progressing   Problem: Activity: Goal: Interest or engagement in leisure activities will improve Outcome: Progressing   

## 2023-01-20 NOTE — Progress Notes (Signed)
D. Pt continues to present depressed,isolative, and will only attend groups after much encouragement. Pt reported that she didn't like to go to groups because she believed people were making fun of her. Pt currently denies SI/HI and AVH and does not appear to be responding to internal stimuli.  A. Labs and vitals monitored. Pt supported emotionally and encouraged to express concerns and ask questions.   R. Pt remains safe with 15 minute checks. Will continue POC.

## 2023-01-20 NOTE — B[facility] Group Notes (Signed)
Psychoeducational Group Note  Date:  01/20/2023 Time:  2000  Group Topic/Focus:  Wrap up group  Participation Level: Did Not Attend  Participation Quality:  Not Applicable  Affect:  Not Applicable  Cognitive:  Not Applicable  Insight:  Not Applicable  Engagement in Group: Not Applicable  Additional Comments:  Did not attend.   [name] 01/20/2023, 8:59 PM

## 2023-01-20 NOTE — Progress Notes (Signed)
   01/20/23 2225  Psych Admission Type (Psych Patients Only)  Admission Status Voluntary  Psychosocial Assessment  Patient Complaints Depression  Eye Contact Brief  Facial Expression Animated  Affect Appropriate to circumstance  Speech Logical/coherent;Soft  Interaction Childlike  Motor Activity Slow  Appearance/Hygiene Disheveled;Poor hygiene  Behavior Characteristics Appropriate to situation  Mood Pleasant;Depressed  Thought Process  Coherency WDL  Content WDL  Delusions None reported or observed  Perception WDL  Hallucination None reported or observed  Judgment Limited  Confusion None  Danger to Self  Current suicidal ideation? Denies  Self-Injurious Behavior No self-injurious ideation or behavior indicators observed or expressed   Agreement Not to Harm Self Yes  Description of Agreement verbal  Danger to Others  Danger to Others None reported or observed

## 2023-01-20 NOTE — B[facility] Group Notes (Signed)
Adult Psychoeducational Group Note  Date:  01/20/2023 Time:  10:18 AM  Group Topic/Focus:  Goals Group:   The focus of this group is to help patients establish daily goals to achieve during treatment and discuss how the patient can incorporate goal setting into their daily lives to aide in recovery.  Participation Level:  Did Not Attend  Participation Quality:   Did not attend  Affect:   Did not attend  Cognitive:   Did not attend  Insight: None  Engagement in Group:   Did not attend  Modes of Intervention:   Did not attend  Additional Comments:  Did not attend  Gwinda Maine 01/20/2023, 10:18 AM

## 2023-01-20 NOTE — Group Note (Signed)
Date:  01/20/2023 Time:  4:55 PM  Group Topic/Focus:  Building Self Esteem:   The Focus of this group is helping patients become aware of the effects of self-esteem on their lives, the things they and others do that enhance or undermine their self-esteem, seeing the relationship between their level of self-esteem and the choices they make and learning ways to enhance self-esteem.    Participation Level:  Active  Participation Quality:  Appropriate  Affect:  Appropriate  Cognitive:  Appropriate  Insight: Appropriate  Engagement in Group:  Engaged  Modes of Intervention:  Education  Additional Comments:   This group was based on building  self-esteem while  using the micro phones to state one thing I use to do, and and what I do now.the courage to speak out. PT decided not to participate in the second part of the group.  Gwinda Maine 01/20/2023, 4:55 PM

## 2023-01-21 DIAGNOSIS — F251 Schizoaffective disorder, depressive type: Secondary | ICD-10-CM | POA: Diagnosis not present

## 2023-01-21 NOTE — Progress Notes (Signed)
   01/21/23 0800  Psych Admission Type (Psych Patients Only)  Admission Status Voluntary  Psychosocial Assessment  Patient Complaints Depression  Eye Contact Brief  Facial Expression Animated  Affect Appropriate to circumstance  Speech Logical/coherent  Interaction Childlike  Motor Activity Slow  Appearance/Hygiene Disheveled;Poor hygiene  Behavior Characteristics Appropriate to situation  Mood Depressed;Anxious  Aggressive Behavior  Effect No apparent injury  Thought Process  Coherency WDL  Content WDL  Delusions None reported or observed  Perception WDL  Hallucination None reported or observed  Judgment Limited  Confusion None  Danger to Self  Current suicidal ideation? Denies  Agreement Not to Harm Self Yes  Description of Agreement verbal  Danger to Others  Danger to Others None reported or observed

## 2023-01-21 NOTE — [facility] Group Notes (Signed)
Spiritual care group on grief and loss facilitated by Chaplain Katy Claussen, [facility]  Group Goal: Support / Education around grief and loss  Members engage in facilitated group support and psycho-social education.  Group Description:  Following introductions and group rules, group members engaged in facilitated group dialogue and support around topic of loss, with particular support around experiences of loss in their lives. Group Identified types of loss (relationships / self / things) and identified patterns, circumstances, and changes that precipitate losses. Reflected on thoughts / feelings around loss, normalized grief responses, and recognized variety in grief experience. Group encouraged individual reflection on safe space and on the coping skills that they are already utilizing.  Group drew on Adlerian / Rogerian and narrative framework  Patient Progress: Did not attend.  

## 2023-01-21 NOTE — Progress Notes (Signed)
   01/21/23 2305  Psych Admission Type (Psych Patients Only)  Admission Status Voluntary  Psychosocial Assessment  Patient Complaints Depression  Eye Contact Brief  Facial Expression Sad  Affect Sad  Speech Logical/coherent;Soft  Interaction Childlike  Motor Activity Slow;Tremors  Appearance/Hygiene Disheveled;Poor hygiene  Behavior Characteristics Appropriate to situation  Mood Depressed;Sad  Aggressive Behavior  Effect No apparent injury  Thought Process  Coherency WDL  Content WDL  Delusions None reported or observed  Perception WDL  Hallucination None reported or observed  Judgment Limited  Confusion None  Danger to Self  Current suicidal ideation? Denies  Self-Injurious Behavior No self-injurious ideation or behavior indicators observed or expressed   Agreement Not to Harm Self Yes  Description of Agreement verbal  Danger to Others  Danger to Others None reported or observed

## 2023-01-21 NOTE — [facility] Group Notes (Signed)
Pt did not attend AA group  

## 2023-01-21 NOTE — Group Note (Signed)
Recreation Therapy Group Note   Group Topic:Health and Wellness  Group Date: 01/21/2023 Start Time: 0935 End Time: 1016 Facilitators: Adriel Kessen-McCall, LRT,CTRS Location: [address] Dayroom   Goal Area(s) Addresses:  Patient will define components of whole wellness. Patient will verbalize benefit of whole wellness.  Group Description: Exercise. LRT and patients discussed the importance of good physical health. LRT explained the activity to patients. Patients were informed they were going to take turns leading the group in the exercises of their choosing. Patients were to complete at least 20-30 minutes of exercise. Patients were encouraged to take breaks and get water as needed.   Affect/Mood: N/A   Participation Level: Did not attend    Clinical Observations/Individualized Feedback:     Plan: Continue to engage patient in RT group sessions 2-3x/week.   Karsynn Deweese-McCall, LRT,CTRS 01/21/2023 12:47 PM

## 2023-01-21 NOTE — Plan of Care (Signed)
  Problem: Health Behavior/Discharge Planning: Goal: Compliance with therapeutic regimen will improve Outcome: Progressing   Problem: Coping: Goal: Will verbalize feelings Outcome: Progressing

## 2023-01-21 NOTE — Progress Notes (Signed)
Patient ID: Yolanda [name], female   DOB: January 29, 1976, 47 y.o.   MRN: [id number] [facility] MD Progress Note  01/21/2023 12:24 PM Yolanda [name]  MRN:  [id number]  Subjective information: Yolanda [name] states, "My anxiety is high and I have been depressed because my heart is telling me I want to die,But I do not want to die.  Reports anxiety and depression as 10/10, with 10 being highest severity."  Reason for admission: This is the first psychiatric admission in this [facility] in 12 years for this 66 AA female with an extensive hx of mental illnesses & probable polysubstance use disorders. She is admitted to the [facility] from the [facility] hospital with complain of worsening suicidal ideations with plan to stab herself. Per chart review, patient apparently reported at the ED that she has been depressed for a while & has not been taking her mental health medications. After medical evaluation.clearance, she was transferred to the [facility] for further psychiatric evaluation/treatments.  APS report on 6/24 Has payee, no legal guardian Continues to await placement  Yesterday the psychiatry team made the following recommendations:  Continue Buspar 15 mg po bid for anxiety.  -Continue paliperidone 6 mg po qd for mood stabilization -Continued Sertraline 100 mg po Q daily for depression/anxiety -Continue Trazodone 50 mg po Q bedtime prn for insomnia -Continue Nicoderm topically Q 24 hrs for nicotine withdrawal management -Continue vitamin B12 1000 mcg IM weekly for 4 weeks then monthly -Continue Melatonin 5 mg nightly for sleep -Continue Ensure nutritional shakes TID in between meals  -Previously discontinued Hydroxyzine 50 mg nightly-hypotension in the mornings  On assessment today, the pt reports that her mood is labile and physically crying.        Reports that anxiety is increased due to hearing voices telling her, that she is "worthless and other negative things." Nursing staff report patient sleeping  about 7.5 hours last night.   Appetite is good and observed several snacks and drinks on her bed and at the bedside Concentration is fair Energy level is adequate Endorses passive suicidal thoughts, without intent, or plans to carry it out.  Patient was able to contract for safety. Denies having any HI.  Endorses having auditory hallucination, however, patient phrased "my heart is telling me I want to die, but I do not want to die."  This could be patient's thought process and not related auditory hallucination.   Denies having side effects to current psychiatric medications.   We discussed compliance to current medication regimen, and patient endorses understanding.  Discussed the following psychosocial stressors: Attending unit group activities and therapeutic milieu which could improve her mood.  Encouraged to come out of her room during meals and group activities.  We discussed improvement with personal hygiene, taking a shower and changing her clothing.  Principal Problem: Schizoaffective disorder, depressive type (HCC)   Diagnosis: Principal Problem:   Schizoaffective disorder, depressive type (HCC) Active Problems:   GERD (gastroesophageal reflux disease)   Intellectual disability   Tobacco use disorder  Past Psychiatric History: Patient is not forth coming with the answers to the assessment questions. She is refusing to provide information about her mental health hx or symptoms.   Past Medical History:  Past Medical History:  Diagnosis Date   Bipolar affect, depressed (HCC)    Constipation 08/17/2022   Depression    Falls 07/21/2022   Fracture of femoral neck, right, closed (HCC) 01/17/2022   Herpes simplex 08/22/2017   Open fracture dislocation of  right elbow joint 01/17/2022    Past Surgical History:  Procedure Laterality Date   NO PAST SURGERIES     SALPINGECTOMY     Family History: History reviewed. No pertinent family history.  Family Psychiatric  History: See  H&P.  Social History:  Social History   Substance and Sexual Activity  Alcohol Use Yes     Social History   Substance and Sexual Activity  Drug Use Yes   Types: Cocaine, Marijuana    Social History   Socioeconomic History   Marital status: Single    Spouse name: Not on file   Number of children: Not on file   Years of education: Not on file   Highest education level: Not on file  Occupational History   Not on file  Tobacco Use   Smoking status: Every Day   Smokeless tobacco: Not on file  Substance and Sexual Activity   Alcohol use: Yes   Drug use: Yes    Types: Cocaine, Marijuana   Sexual activity: Yes  Other Topics Concern   Not on file  Social History Narrative   Not on file   Social Determinants of Health   Financial Resource Strain: Low Risk  (09/12/2022)   Received from [facility]   Overall Financial Resource Strain (CARDIA)    Difficulty of Paying Living Expenses: Not hard at all  Food Insecurity: Patient Declined (12/20/2022)   Hunger Vital Sign    Worried About Running Out of Food in the Last Year: Patient declined    Ran Out of Food in the Last Year: Patient declined  Transportation Needs: No Transportation Needs (12/20/2022)   PRAPARE - Administrator, Civil Service (Medical): No    Lack of Transportation (Non-Medical): No  Physical Activity: Not on file  Stress: No Stress Concern Present (07/17/2022)   Received from [facility] - Occupational Stress Questionnaire    Feeling of Stress : Not at all  Social Connections: Unknown (07/16/2022)   Received from [facility]   Social Network    Social Network: Not on file   Current Medications: Current Facility-Administered Medications  Medication Dose Route Frequency Provider Last Rate Last Admin   acetaminophen (TYLENOL) tablet 650 mg  650 mg Oral Q6H PRN [name], NP   650 mg at 01/19/23 1855   alum & mag hydroxide-simeth (MAALOX/MYLANTA)  200-200-20 MG/5ML suspension 30 mL  30 mL Oral Q4H PRN [name], NP       busPIRone (BUSPAR) tablet 15 mg  15 mg Oral BID [name], NP   15 mg at 01/21/23 0919   cyanocobalamin (VITAMIN B12) injection 1,000 mcg  1,000 mcg Intramuscular Weekly Attiah, Nadir, MD   1,000 mcg at 01/19/23 1244   Followed by   Melene Muller ON 02/02/2023] cyanocobalamin (VITAMIN B12) injection 1,000 mcg  1,000 mcg Intramuscular Q30 days [name], Nadir, MD       diphenhydrAMINE (BENADRYL) capsule 50 mg  50 mg Oral TID PRN [name], NP   50 mg at 01/15/23 1211   Or   diphenhydrAMINE (BENADRYL) injection 50 mg  50 mg Intramuscular TID PRN [name], NP       feeding supplement (ENSURE ENLIVE / ENSURE PLUS) liquid 237 mL  237 mL Oral TID BM Nkwenti, Doris, NP   237 mL at 01/21/23 0920   haloperidol (HALDOL) tablet 5 mg  5 mg Oral TID PRN [name], NP   5 mg  at 01/15/23 1211   Or   haloperidol lactate (HALDOL) injection 5 mg  5 mg Intramuscular TID PRN [name], NP       hydrOXYzine (ATARAX) tablet 25 mg  25 mg Oral TID PRN [name], DO   25 mg at 01/21/23 1914   LORazepam (ATIVAN) tablet 2 mg  2 mg Oral TID PRN [name], NP   2 mg at 01/15/23 1211   Or   LORazepam (ATIVAN) injection 2 mg  2 mg Intramuscular TID PRN [name], NP       magnesium hydroxide (MILK OF MAGNESIA) suspension 30 mL  30 mL Oral Daily PRN [name], NP       melatonin tablet 5 mg  5 mg Oral QHS Nkwenti, Doris, NP   5 mg at 01/20/23 2036   nicotine (NICODERM CQ - dosed in mg/24 hours) patch 21 mg  21 mg Transdermal Daily [name], DO   21 mg at 01/18/23 0915   paliperidone (INVEGA) 24 hr tablet 6 mg  6 mg Oral Daily [name], NP   6 mg at 01/20/23 2036   pantoprazole (PROTONIX) EC tablet 40 mg  40 mg Oral Daily [name], NP   40 mg at 01/21/23 0919   polyethylene glycol (MIRALAX / GLYCOLAX) packet 17 g  17 g Oral BID [name], DO   17 g at 01/21/23 7829   sertraline (ZOLOFT) tablet 100  mg  100 mg Oral Daily Park Pope, MD   100 mg at 01/21/23 0919   traZODone (DESYREL) tablet 50 mg  50 mg Oral QHS [name], NP   50 mg at 01/20/23 2036   Lab Results:  No results found for this or any previous visit (from the past 48 hour(s)).  Blood Alcohol level:  Lab Results  Component Value Date   ETH <10 12/19/2022   ETH <10 11/21/2019   Metabolic Disorder Labs: Lab Results  Component Value Date   HGBA1C 4.4 (L) 12/21/2022   MPG 79.58 12/21/2022   No results found for: "PROLACTIN" Lab Results  Component Value Date   CHOL 218 (H) 12/21/2022   TRIG 81 12/21/2022   HDL 53 12/21/2022   CHOLHDL 4.1 12/21/2022   VLDL 16 12/21/2022   LDLCALC 149 (H) 12/21/2022   LDLCALC 110 (H) 03/13/2011   Physical Findings: AIMS: Facial and Oral Movements Muscles of Facial Expression: None, normal Lips and Perioral Area: None, normal Jaw: None, normal Tongue: None, normal,Extremity Movements Upper (arms, wrists, hands, fingers): None, normal Lower (legs, knees, ankles, toes): None, normal, Trunk Movements Neck, shoulders, hips: None, normal, Overall Severity Severity of abnormal movements (highest score from questions above): None, normal Incapacitation due to abnormal movements: None, normal Patient's awareness of abnormal movements (rate only patient's report): No Awareness, Dental Status Current problems with teeth and/or dentures?: No Does patient usually wear dentures?: No   Musculoskeletal: Strength & Muscle Tone: within normal limits Gait & Station: normal Patient leans: N/A  Psychiatric Specialty Exam:  Presentation  General Appearance:  Lying down in bed, better hygiene noted  Eye Contact: Fair Speech: Garbled  Speech Volume: Decreased  Handedness: Right  Mood and Affect  Mood: Depressed; Anxious (Rating anxiety and depression as 10/10 on a scale of 0-10, with 10 being high severity)  Affect: Congruent; Labile  Thought Process  Thought  Processes: Disorganized  Descriptions of Associations:Tangential  Orientation:Full (Time, Place and Person)  Thought Content: Denies SI HI or AVH does not present  responding to stimuli. Presentation consistent with moderate intellectual disability  History of Schizophrenia/Schizoaffective disorder:Yes  Duration of Psychotic Symptoms:Greater than six months  Hallucinations:Hallucinations: Auditory Description of Auditory Hallucinations: Patient reports, "My heart is telling me I want to die, but I do not want to die."  Ideas of Reference:None  Suicidal Thoughts:Suicidal Thoughts: Yes, Passive SI Active Intent and/or Plan: -- (Able to contract for safety) SI Passive Intent and/or Plan: Without Intent; Without Plan; Without Means to Carry Out  Homicidal Thoughts:Homicidal Thoughts: No  Sensorium  Memory: Immediate Fair; Recent Fair  Judgment: Limited Insight: Limited Executive Functions  Concentration: Fair  Attention Span: Fair  Recall: [employer] of Knowledge: Fair  Language: Fair  Psychomotor Activity  Psychomotor Activity: Psychomotor Activity: Normal  Assets  Assets: Communication Skills; Desire for Improvement; Physical Health; Resilience; Social Support (Social support from DSS)  Sleep  Sleep: Sleep: Good Number of Hours of Sleep: 7.5  Physical Exam: Physical Exam Vitals and nursing note reviewed.  Constitutional:      General: She is not in acute distress.    Appearance: She is obese. She is not ill-appearing or diaphoretic.  HENT:     Head: Normocephalic.     Nose: No congestion.     Mouth/Throat:     Pharynx: Oropharynx is clear.  Eyes:     Pupils: Pupils are equal, round, and reactive to light.  Cardiovascular:     Rate and Rhythm: Normal rate.     Pulses: Normal pulses.  Pulmonary:     Effort: Pulmonary effort is normal. No respiratory distress.  Abdominal:     Comments: Deferred  Genitourinary:    Comments:  Deferred Musculoskeletal:        General: Normal range of motion.     Cervical back: Normal range of motion.  Skin:    General: Skin is warm and dry.  Neurological:     General: No focal deficit present.     Mental Status: She is alert.  Psychiatric:     Comments: Mood is labile    Blood pressure 114/82, pulse 75, temperature 98 F (36.7 C), temperature source Oral, resp. rate 18, height 4\' 11"  (1.499 m), weight 63 kg, SpO2 100%. Body mass index is 28.05 kg/m.  Treatment Plan Summary: Daily contact with patient to assess and evaluate symptoms and progress in treatment and Medication management.   Still waiting on safe disposition as patient would not be able to live independently given cognitive impairment. Recommend assisted living.   No medication side effects.  Because of urinary incontinence, recommended adult diapers to be worn.  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  Plan:  -Continue Buspar 15 mg po bid for anxiety.  -Continue paliperidone 6 mg po qd for mood stabilization -Continued Sertraline 100 mg po Q daily for depression/anxiety -Continue Trazodone 50 mg po Q bedtime prn for insomnia -Continue Nicoderm topically Q 24 hrs for nicotine withdrawal management -Continue vitamin B12 1000 mcg IM weekly for 4 weeks then monthly -Continue Melatonin 5 mg nightly for sleep -Continue Ensure nutritional shakes TID in between meals  -Previously discontinued Hydroxyzine 50 mg nightly-hypotension in the mornings  Lab work and EKG, reviewed with no significant abnormalities noted, except slightly low b12 166.  Safety and Monitoring: Voluntary admission to inpatient psychiatric unit for  safety, stabilization and  treatment Daily contact with patient to assess and evaluate symptoms and progress in treatment Patient's case to be discussed in multi-disciplinary team meeting Observation Level : q15 minute checks Vital signs: q12 hours Precautions: Safety   Discharge Planning: Social work and case management to assist with discharge planning and identification of hospital follow-up needs prior to discharge Estimated LOS: Unknown at this time. Discharge Concerns: Need to establish a safety plan; Medication compliance and effectiveness Discharge Goals: Return home with outpatient referrals for mental health follow-up including medication management/psychotherapy No legal guardian, has payee  Cecilie Lowers, FNP. 01/21/2023, 12:24 PM Patient ID: Yolanda [name], female   DOB: [birth year], 47 y.o.    MRN: [id number] Patient ID: Yolanda [name], female   DOB: 1975-12-14, 47 y.o.   MRN: [id number] Patient ID: Yolanda [name], female   DOB: July 08, 1976, 47 y.o.   MRN: [id number] Patient ID: Yolanda [name], female   DOB: 12-31-1975, 47 y.o.   MRN: [id number] Patient ID: Yolanda [name], female   DOB: June 19, 1976, 47 y.o.   MRN: [id number] Patient ID: Yolanda [name], female   DOB: Nov 22, 1975, 47 y.o.   MRN: [id number] Patient ID: Yolanda [name], female   DOB: 1976/06/15, 47 y.o.   MRN: [id number] Patient ID: Yolanda [name], female   DOB: Sep 27, 1975, 47 y.o.   MRN: [id number]

## 2023-01-21 NOTE — Plan of Care (Signed)
  Problem: Education: Goal: Utilization of techniques to improve thought processes will improve Outcome: Progressing Goal: Knowledge of the prescribed therapeutic regimen will improve Outcome: Progressing   Problem: Activity: Goal: Interest or engagement in leisure activities will improve Outcome: Progressing Goal: Imbalance in normal sleep/wake cycle will improve Outcome: Progressing   

## 2023-01-22 DIAGNOSIS — F251 Schizoaffective disorder, depressive type: Secondary | ICD-10-CM | POA: Diagnosis not present

## 2023-01-22 MED ORDER — TRAZODONE HCL 50 MG PO TABS
50.0000 mg | ORAL_TABLET | Freq: Every evening | ORAL | Status: DC | PRN
  Administered 2023-01-22 – 2023-01-24 (×3): 50 mg via ORAL
  Filled 2023-01-22 (×2): qty 1

## 2023-01-22 NOTE — Progress Notes (Signed)
Patient ID: Yolanda [name], female   DOB: December 30, 1975, 47 y.o.   MRN: [id number] [facility] MD Progress Note  01/22/2023 10:44 AM Yolanda [name]  MRN:  [id number]  Reason for admission: This is the first psychiatric admission in this [facility] in 12 years for this 12 AA female with an extensive hx of mental illnesses & probable polysubstance use disorders. She is admitted to the [facility] from the [facility] hospital with complain of worsening suicidal ideations with plan to stab herself. Per chart review, patient apparently reported at the ED that she has been depressed for a while & has not been taking her mental health medications. After medical evaluation.clearance, she was transferred to the [facility] for further psychiatric evaluation/treatments.  APS report on 6/24 Has payee, no legal guardian Continues to await placement  Yesterday the psychiatry team made the following recommendations:  Continue Buspar 15 mg po bid for anxiety.  -Continue paliperidone 6 mg po qd for mood stabilization -Continued Sertraline 100 mg po Q daily for depression/anxiety -Change trazodone 50 mg po Q bedtime to q. nightly prn for insomnia 01/22/23 due to low blood pressure -Continue Nicoderm topically Q 24 hrs for nicotine withdrawal management (patient refusing nicotine patches). -Continue vitamin B12 1000 mcg IM weekly for 4 weeks then monthly -Continue Melatonin 5 mg nightly for sleep -Continue Ensure nutritional shakes TID in between meals  -Previously discontinued Hydroxyzine 50 mg nightly-hypotension in the mornings  On assessment today, the pt reports that her mood is sad.        Reports that anxiety is at manageable level and rated as #6/10, with 10 being high severity Nursing staff report patient sleeping about 8 hours last night.   Appetite is good and observed several snacks and drinks on her bed and at the bedside Concentration is fair Energy level is adequate Denies passive suicidal thoughts.  Denies suicidal  intent, or plans.   Denies having any HI.  Denies psychotic symptoms today.  Denies having side effects to current psychiatric medications.   We discussed compliance to current medication regimen, and patient endorses understanding.  Discussed the following psychosocial stressors: Attending unit group activities and therapeutic milieu which could improve her mood.  Encouraged to come out of her room during meals and group activities.  We discussed improvement with personal hygiene, taking a shower and changing her clothing.  Principal Problem: Schizoaffective disorder, depressive type (HCC)   Diagnosis: Principal Problem:   Schizoaffective disorder, depressive type (HCC) Active Problems:   GERD (gastroesophageal reflux disease)   Intellectual disability   Tobacco use disorder  Past Psychiatric History: Patient is not forth coming with the answers to the assessment questions. She is refusing to provide information about her mental health hx or symptoms.   Past Medical History:  Past Medical History:  Diagnosis Date   Bipolar affect, depressed (HCC)    Constipation 08/17/2022   Depression    Falls 07/21/2022   Fracture of femoral neck, right, closed (HCC) 01/17/2022   Herpes simplex 08/22/2017   Open fracture dislocation of right elbow joint 01/17/2022    Past Surgical History:  Procedure Laterality Date   NO PAST SURGERIES     SALPINGECTOMY     Family History: History reviewed. No pertinent family history.  Family Psychiatric  History: See H&P.  Social History:  Social History   Substance and Sexual Activity  Alcohol Use Yes     Social History   Substance and Sexual Activity  Drug Use Yes  Types: Cocaine, Marijuana    Social History   Socioeconomic History   Marital status: Single    Spouse name: Not on file   Number of children: Not on file   Years of education: Not on file   Highest education level: Not on file  Occupational History   Not on file   Tobacco Use   Smoking status: Every Day   Smokeless tobacco: Not on file  Substance and Sexual Activity   Alcohol use: Yes   Drug use: Yes    Types: Cocaine, Marijuana   Sexual activity: Yes  Other Topics Concern   Not on file  Social History Narrative   Not on file   Social Determinants of Health   Financial Resource Strain: Low Risk  (09/12/2022)   Received from [facility]   Overall Financial Resource Strain (CARDIA)    Difficulty of Paying Living Expenses: Not hard at all  Food Insecurity: Patient Declined (12/20/2022)   Hunger Vital Sign    Worried About Running Out of Food in the Last Year: Patient declined    Ran Out of Food in the Last Year: Patient declined  Transportation Needs: No Transportation Needs (12/20/2022)   PRAPARE - Administrator, Civil Service (Medical): No    Lack of Transportation (Non-Medical): No  Physical Activity: Not on file  Stress: No Stress Concern Present (07/17/2022)   Received from [facility] - Occupational Stress Questionnaire    Feeling of Stress : Not at all  Social Connections: Unknown (07/16/2022)   Received from [facility]   Social Network    Social Network: Not on file   Current Medications: Current Facility-Administered Medications  Medication Dose Route Frequency Provider Last Rate Last Admin   acetaminophen (TYLENOL) tablet 650 mg  650 mg Oral Q6H PRN [name], NP   650 mg at 01/19/23 1855   alum & mag hydroxide-simeth (MAALOX/MYLANTA) 200-200-20 MG/5ML suspension 30 mL  30 mL Oral Q4H PRN [name], NP       busPIRone (BUSPAR) tablet 15 mg  15 mg Oral BID [name], NP   15 mg at 01/22/23 1011   cyanocobalamin (VITAMIN B12) injection 1,000 mcg  1,000 mcg Intramuscular Weekly Attiah, Nadir, MD   1,000 mcg at 01/19/23 1244   Followed by   Melene Muller ON 02/02/2023] cyanocobalamin (VITAMIN B12) injection 1,000 mcg  1,000 mcg Intramuscular Q30 days [name], Nadir, MD        diphenhydrAMINE (BENADRYL) capsule 50 mg  50 mg Oral TID PRN [name], NP   50 mg at 01/15/23 1211   Or   diphenhydrAMINE (BENADRYL) injection 50 mg  50 mg Intramuscular TID PRN [name], NP       feeding supplement (ENSURE ENLIVE / ENSURE PLUS) liquid 237 mL  237 mL Oral TID BM Nkwenti, Doris, NP   237 mL at 01/22/23 1011   haloperidol (HALDOL) tablet 5 mg  5 mg Oral TID PRN [name], NP   5 mg at 01/15/23 1211   Or   haloperidol lactate (HALDOL) injection 5 mg  5 mg Intramuscular TID PRN [name], NP       hydrOXYzine (ATARAX) tablet 25 mg  25 mg Oral TID PRN [name], DO   25 mg at 01/21/23 2028   LORazepam (ATIVAN) tablet 2 mg  2 mg Oral TID PRN [name], NP   2 mg at 01/15/23 1211   Or  LORazepam (ATIVAN) injection 2 mg  2 mg Intramuscular TID PRN [name], NP       magnesium hydroxide (MILK OF MAGNESIA) suspension 30 mL  30 mL Oral Daily PRN [name], NP       melatonin tablet 5 mg  5 mg Oral QHS Nkwenti, Doris, NP   5 mg at 01/21/23 2028   nicotine (NICODERM CQ - dosed in mg/24 hours) patch 21 mg  21 mg Transdermal Daily [name], DO   21 mg at 01/18/23 0915   paliperidone (INVEGA) 24 hr tablet 6 mg  6 mg Oral Daily [name], NP   6 mg at 01/21/23 2028   pantoprazole (PROTONIX) EC tablet 40 mg  40 mg Oral Daily [name], NP   40 mg at 01/22/23 1011   polyethylene glycol (MIRALAX / GLYCOLAX) packet 17 g  17 g Oral BID [name], DO   17 g at 01/21/23 5621   sertraline (ZOLOFT) tablet 100 mg  100 mg Oral Daily Park Pope, MD   100 mg at 01/22/23 1011   traZODone (DESYREL) tablet 50 mg  50 mg Oral QHS [name], NP   50 mg at 01/21/23 2028   Lab Results:  No results found for this or any previous visit (from the past 48 hour(s)).  Blood Alcohol level:  Lab Results  Component Value Date   ETH <10 12/19/2022   ETH <10 11/21/2019   Metabolic Disorder Labs: Lab Results  Component Value Date   HGBA1C 4.4 (L)  12/21/2022   MPG 79.58 12/21/2022   No results found for: "PROLACTIN" Lab Results  Component Value Date   CHOL 218 (H) 12/21/2022   TRIG 81 12/21/2022   HDL 53 12/21/2022   CHOLHDL 4.1 12/21/2022   VLDL 16 12/21/2022   LDLCALC 149 (H) 12/21/2022   LDLCALC 110 (H) 03/13/2011   Physical Findings: AIMS: Facial and Oral Movements Muscles of Facial Expression: None, normal Lips and Perioral Area: None, normal Jaw: None, normal Tongue: None, normal,Extremity Movements Upper (arms, wrists, hands, fingers): None, normal Lower (legs, knees, ankles, toes): None, normal, Trunk Movements Neck, shoulders, hips: None, normal, Overall Severity Severity of abnormal movements (highest score from questions above): None, normal Incapacitation due to abnormal movements: None, normal Patient's awareness of abnormal movements (rate only patient's report): No Awareness, Dental Status Current problems with teeth and/or dentures?: No Does patient usually wear dentures?: No   Musculoskeletal: Strength & Muscle Tone: within normal limits Gait & Station: normal Patient leans: N/A  Psychiatric Specialty Exam:  Presentation  General Appearance:  Lying down in bed, better hygiene noted  Eye Contact: Fair Speech: Garbled  Speech Volume: Decreased  Handedness: Right  Mood and Affect  Mood: Anxious; Depressed  Affect: Congruent  Thought Process  Thought Processes: Disorganized  Descriptions of Associations:Tangential  Orientation:Full (Time, Place and Person)  Thought Content: Denies SI HI or AVH does not present responding to stimuli. Presentation consistent with moderate intellectual disability  History of Schizophrenia/Schizoaffective disorder:Yes  Duration of Psychotic Symptoms:Greater than six months  Hallucinations:Hallucinations: None Description of Auditory Hallucinations: Denies  Ideas of Reference:None  Suicidal Thoughts:Suicidal Thoughts: No SI Active Intent  and/or Plan: -- (Denies) SI Passive Intent and/or Plan: -- (Denies)  Homicidal Thoughts:Homicidal Thoughts: No  Sensorium  Memory: Immediate Fair; Recent Fair  Judgment: Limited Insight: Limited Executive Functions  Concentration: Fair  Attention Span: Fair  Recall: [employer] of Knowledge: Fair  Language: Fair  Psychomotor Activity  Psychomotor  Activity: Psychomotor Activity: Normal  Assets  Assets: Communication Skills; Desire for Improvement; Physical Health; Resilience  Sleep  Sleep: Sleep: Good Number of Hours of Sleep: 8  Physical Exam: Physical Exam Vitals and nursing note reviewed.  Constitutional:      General: She is not in acute distress.    Appearance: She is obese. She is not ill-appearing or diaphoretic.  HENT:     Head: Normocephalic.     Nose: No congestion.     Mouth/Throat:     Pharynx: Oropharynx is clear.  Eyes:     Pupils: Pupils are equal, round, and reactive to light.  Cardiovascular:     Rate and Rhythm: Normal rate.     Pulses: Normal pulses.  Pulmonary:     Effort: Pulmonary effort is normal. No respiratory distress.  Abdominal:     Comments: Deferred  Genitourinary:    Comments: Deferred Musculoskeletal:        General: Normal range of motion.     Cervical back: Normal range of motion.  Skin:    General: Skin is warm and dry.  Neurological:     General: No focal deficit present.     Mental Status: She is alert.  Psychiatric:     Comments: Mood is sad    Blood pressure 98/72, pulse 95, temperature 98.2 F (36.8 C), temperature source Oral, resp. rate 18, height 4\' 11"  (1.499 m), weight 63 kg, SpO2 100%. Body mass index is 28.05 kg/m.  Treatment Plan Summary: Daily contact with patient to assess and evaluate symptoms and progress in treatment and Medication management.   Still waiting on safe disposition as patient would not be able to live independently given cognitive impairment. Recommend assisted living.    No medication side effects.  Because of urinary incontinence, recommended adult diapers to be worn.  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  Plan:  -Continue Buspar 15 mg po bid for anxiety.  -Continue paliperidone 6 mg po qd for mood stabilization -Continued Sertraline 100 mg po Q daily for depression/anxiety -Change trazodone 50 mg po Q bedtime to prn for insomnia due to decreased blood pressure -Continue Nicoderm topically Q 24 hrs for nicotine withdrawal management (patient refusing nicotine patches). -Continue vitamin B12 1000 mcg IM weekly for 4 weeks then monthly -Continue Melatonin 5 mg nightly for sleep -Continue Ensure nutritional shakes TID in between meals  -Previously discontinued Hydroxyzine 50 mg nightly-hypotension in the mornings  Lab work and EKG, reviewed with no significant abnormalities noted, except slightly low b12 166.  Safety and Monitoring: Voluntary admission to inpatient psychiatric unit for safety, stabilization and treatment Daily contact with patient to assess and evaluate symptoms and progress in treatment Patient's case to be discussed in multi-disciplinary team meeting Observation Level : q15 minute checks Vital signs: q12 hours Precautions: Safety   Discharge Planning: Social work and case management to assist with discharge planning and identification of hospital follow-up needs prior to discharge Estimated LOS: Unknown at this time. Discharge Concerns: Need to establish a safety plan; Medication compliance and effectiveness Discharge Goals: Return home with outpatient referrals for mental health follow-up including medication management/psychotherapy No legal guardian, has  payee  Cecilie Lowers, FNP. 01/22/2023, 10:44 AM Patient ID: Yolanda [name], female   DOB: 09-Nov-1975, 47 y.o.  MRN: [id number] Patient ID: Yolanda [name], female   DOB: [birth year], 47 y.o.   MRN: [id number] Patient ID: Yolanda [name], female   DOB: 1976-04-06, 47 y.o.   MRN: [id number] Patient ID: Yolanda [name], female   DOB: Oct 09, 1975, 47 y.o.   MRN: [id number] Patient ID: Yolanda [name], female   DOB: 05-20-1976, 47 y.o.   MRN: [id number] Patient ID: Yolanda [name], female   DOB: 10-05-1975, 47 y.o.   MRN: [id number] Patient ID: Yolanda [name], female   DOB: March 12, 1976, 47 y.o.   MRN: [id number] Patient ID: Yolanda [name], female   DOB: 10/10/1975, 47 y.o.   MRN: [id number] Patient ID: Yolanda [name], female   DOB: 27-Jan-1976, 47 y.o.   MRN: [id number]

## 2023-01-22 NOTE — Group Note (Signed)
Recreation Therapy Group Note   Group Topic:Animal Assisted Therapy   Group Date: 01/22/2023 Start Time: 1008 End Time: 1042 Facilitators: Isbella Arline-McCall, LRT,CTRS Location: [address] Dayroom   Animal-Assisted Activity (AAA) Program Checklist/Progress Notes Patient Eligibility Criteria Checklist & Daily Group note for Rec Tx Intervention  AAA/T Program Assumption of Risk Form signed by Patient/ or Parent Legal Guardian Yes  Patient understands his/her participation is voluntary Yes   Affect/Mood: N/A   Participation Level: Did not attend    Clinical Observations/Individualized Feedback:     Plan: Continue to engage patient in RT group sessions 2-3x/week.   Yolanda [name], LRT,CTRS 01/22/2023 12:46 PM

## 2023-01-22 NOTE — Progress Notes (Signed)
   01/22/23 0800  Psych Admission Type (Psych Patients Only)  Admission Status Voluntary  Psychosocial Assessment  Patient Complaints Depression  Eye Contact Brief  Facial Expression Sad  Affect Sad  Speech Logical/coherent  Interaction Childlike  Motor Activity Slow;Tremors  Appearance/Hygiene Disheveled  Behavior Characteristics Appropriate to situation  Mood Depressed;Sad  Aggressive Behavior  Effect No apparent injury  Thought Process  Coherency WDL  Content WDL  Delusions None reported or observed  Perception WDL  Hallucination None reported or observed  Judgment Limited  Confusion None  Danger to Self  Current suicidal ideation? Denies  Self-Injurious Behavior No self-injurious ideation or behavior indicators observed or expressed   Agreement Not to Harm Self Yes  Description of Agreement Verbal  Danger to Others  Danger to Others None reported or observed

## 2023-01-22 NOTE — Plan of Care (Signed)
  Problem: Coping: Goal: Will verbalize feelings Outcome: Progressing   Problem: Self-Concept: Goal: Level of anxiety will decrease Outcome: Progressing   Problem: Medication: Goal: Compliance with prescribed medication regimen will improve Outcome: Progressing   Problem: Coping: Goal: Ability to demonstrate self-control will improve Outcome: Progressing

## 2023-01-22 NOTE — Progress Notes (Signed)
   01/22/23 2055  Psych Admission Type (Psych Patients Only)  Admission Status Voluntary  Psychosocial Assessment  Patient Complaints Depression  Eye Contact Brief  Facial Expression Sad  Affect Sad  Speech Logical/coherent  Interaction Childlike  Motor Activity Slow;Tremors  Appearance/Hygiene Disheveled  Behavior Characteristics Appropriate to situation  Mood Depressed;Sad;Pleasant  Thought Process  Coherency WDL  Content WDL  Delusions None reported or observed  Perception WDL  Hallucination None reported or observed  Judgment Limited  Confusion None  Danger to Self  Current suicidal ideation? Passive  Self-Injurious Behavior Some self-injurious ideation observed or expressed.  No lethal plan expressed   Agreement Not to Harm Self Yes  Description of Agreement verbal  Danger to Others  Danger to Others None reported or observed

## 2023-01-22 NOTE — Group Note (Signed)
Date:  01/22/2023 Time:  3:24 PM  Group Topic/Focus:  Wellness Toolbox:   The focus of this group is to discuss various aspects of wellness, balancing those aspects and exploring ways to increase the ability to experience wellness.  Patients will create a wellness toolbox for use upon discharge.    Participation Level:  Active  Participation Quality:  Appropriate  Affect:  Appropriate  Cognitive:  Appropriate  Insight: Appropriate  Engagement in Group:  Improving  Modes of Intervention:  Education  Additional Comments:     [name] 01/22/2023, 3:24 PM

## 2023-01-22 NOTE — Group Note (Signed)
Date:  01/22/2023 Time:  12:22 PM  LCSW Group Therapy Note   Group Date: @GROUPDATE @ Start Time: @GROUPSTARTTIME @ End Time: @GROUPENDTIME @   Type of Therapy and Topic:  Group Therapy: Boundaries  Participation Level:    Description of Group: This group will address the use of boundaries in their personal lives. Patients will explore why boundaries are important, the difference between healthy and unhealthy boundaries, and negative and postive outcomes of different boundaries and will look at how boundaries can be crossed.  Patients will be encouraged to identify current boundaries in their own lives and identify what kind of boundary is being set. Facilitators will guide patients in utilizing problem-solving interventions to address and correct types boundaries being used and to address when no boundary is being used. Understanding and applying boundaries will be explored and addressed for obtaining and maintaining a balanced life. Patients will be encouraged to explore ways to assertively make their boundaries and needs known to significant others in their lives, using other group members and facilitator for role play, support, and feedback.  Therapeutic Goals:  1.  Patient will identify areas in their life where setting clear boundaries could be  used to improve their life.  2.  Patient will identify signs/triggers that a boundary is not being respected. 3.  Patient will identify two ways to set boundaries in order to achieve balance in  their lives: 4.  Patient will demonstrate ability to communicate their needs and set boundaries  through discussion and/or role plays  Summary of Patient Progress:  Patient was active throughout the session and proved open to feedback from CSW and peers. Patient demonstrated insight into the subject matter, was respectful of peers, and was present throughout the entire session.  Therapeutic Modalities:   Cognitive Behavioral Therapy Solution-Focused  Therapy  Memory Dance Niema Carrara, LCSWA 01/22/2023  12:02 PM   Group Topic/Focus:  Emotional Education:   The focus of this group is to discuss what feelings/emotions are, and how they are experienced. Self Care:   The focus of this group is to help patients understand the importance of self-care in order to improve or restore emotional, physical, spiritual, interpersonal, and financial health.    Participation Level:  Did Not Attend  Participation Quality:    Affect:    Cognitive:    Insight:   Engagement in Group:    Modes of Intervention:    Additional Comments:    Memory Dance Malijah Lietz 01/22/2023, 12:22 PM

## 2023-01-22 NOTE — Plan of Care (Signed)
  Problem: Activity: Goal: Interest or engagement in leisure activities will improve Outcome: Not Progressing Goal: Imbalance in normal sleep/wake cycle will improve Outcome: Progressing   Problem: Coping: Goal: Coping ability will improve Outcome: Not Progressing Goal: Will verbalize feelings Outcome: Progressing

## 2023-01-22 NOTE — Group Note (Signed)
Date:  01/22/2023 Time:  11:13 AM  Group Topic/Focus:  Goals Group:   The focus of this group is to help patients establish daily goals to achieve during treatment and discuss how the patient can incorporate goal setting into their daily lives to aide in recovery.    Participation Level:  Did Not Attend  Participation Quality:      Affect:      Cognitive:      Insight: None  Engagement in Group:      Modes of Intervention:      Additional Comments:     [name] 01/22/2023, 11:13 AM

## 2023-01-22 NOTE — Plan of Care (Signed)
  Problem: Education: Goal: Utilization of techniques to improve thought processes will improve Outcome: Progressing Goal: Knowledge of the prescribed therapeutic regimen will improve Outcome: Progressing   Problem: Activity: Goal: Interest or engagement in leisure activities will improve Outcome: Progressing Goal: Imbalance in normal sleep/wake cycle will improve Outcome: Progressing   

## 2023-01-23 DIAGNOSIS — F251 Schizoaffective disorder, depressive type: Secondary | ICD-10-CM | POA: Diagnosis not present

## 2023-01-23 NOTE — Plan of Care (Signed)
  Problem: Coping: Goal: Coping ability will improve Outcome: Progressing Goal: Will verbalize feelings Outcome: Progressing   

## 2023-01-23 NOTE — [facility] Group Notes (Signed)
Adult Psychoeducational Group Note  Date:  01/23/2023 Time:  12:16 AM  Group Topic/Focus:  Wrap-Up Group:   The focus of this group is to help patients review their daily goal of treatment and discuss progress on daily workbooks.  Participation Level:  Active  Participation Quality:  Appropriate  Affect:  Appropriate  Cognitive:  Appropriate  Insight: Appropriate  Engagement in Group:  Engaged  Modes of Intervention:  Did not attend group  Additional Comments:  Did not attend group  Joselyn Arrow 01/23/2023, 12:16 AM

## 2023-01-23 NOTE — Progress Notes (Signed)
  Patient reported to nurse's station that she had an accident.  Writer brought in Nurse, learning disability.  Patient removed bedding and placed all dirty towels in hamper.  Writer assisted patient with cleaning and replacing bedding.  Patient removed soiled clothing and took a shower.  Patient is safe on the unit with q15 minute safety checks.

## 2023-01-23 NOTE — Progress Notes (Signed)
   01/23/23 2000  Psych Admission Type (Psych Patients Only)  Admission Status Voluntary  Psychosocial Assessment  Patient Complaints Depression  Eye Contact Brief  Facial Expression Sad  Affect Sad  Speech Logical/coherent  Interaction Childlike  Motor Activity Slow;Tremors  Appearance/Hygiene Disheveled  Behavior Characteristics Appropriate to situation  Mood Depressed;Silly;Pleasant  Thought Process  Coherency WDL  Content WDL  Delusions None reported or observed  Perception WDL  Hallucination None reported or observed  Judgment Limited  Confusion None  Danger to Self  Current suicidal ideation? Denies  Self-Injurious Behavior No self-injurious ideation or behavior indicators observed or expressed   Agreement Not to Harm Self Yes  Description of Agreement verbal  Danger to Others  Danger to Others None reported or observed

## 2023-01-23 NOTE — Progress Notes (Signed)
Patient ID: GRIER CZERWINSKI, female   DOB: [birth year], 47 y.o.   MRN: [id number] [facility] MD Progress Note  01/23/2023 12:35 PM Yolanda [name]  MRN:  [id number]  Reason for admission: This is the first psychiatric admission in this [facility] in 12 years for this 77 AA female with an extensive hx of mental illnesses & probable polysubstance use disorders. She is admitted to the [facility] from the [facility] hospital with complain of worsening suicidal ideations with plan to stab herself. Per chart review, patient apparently reported at the ED that she has been depressed for a while & has not been taking her mental health medications. After medical evaluation.clearance, she was transferred to the [facility] for further psychiatric evaluation/treatments.  APS report on 6/24 Has payee, no legal guardian Continues to await placement  Yesterday the psychiatry team made the following recommendations:  Continue Buspar 15 mg po bid for anxiety.  -Continue Paliperidone 6 mg po qd for mood stabilization -Continued Sertraline 100 mg po Q daily for depression/anxiety -Continue Trazodone 50 mg po Q bedtime to q. nightly prn for insomnia  -Continue Nicoderm 21 mg topically Q 24 hrs for nicotine withdrawal management  -Continue Vitamin B12 1000 mcg IM weekly for 4 weeks then monthly -Continue Melatonin 5 mg nightly for sleep -Continue Ensure nutritional shakes TID in between meals  -Continue Protonix EC 40 mg p.o. daily for GERD -Continue MiraLAX 17 g p.o. twice daily for constipation -Continue hydroxyzine 25 mg p.o. 3 times daily as needed for anxiety -Previously discontinued Hydroxyzine 50 mg nightly-hypotension in the mornings  On assessment today, the pt reports that her mood continues to be sad due to having no where to be discharged to. She feels no facility wants her because she is different. Made pt aware that the LCSW is working with DSS towards  finding her guardianship.      Reports that anxiety is at manageable  level and rated as #5/10, with 10 being high severity Nursing staff report patient sleeping about 7 hours last night.   Appetite is good and observed several snacks and drinks around her bedside. Concentration is fair Energy level is adequate Denies suicidal thoughts.  Denies suicidal intent, or plans.   Denies having any HI.  Denies psychotic symptoms today.  Denies having side effects to current psychiatric medications.   We discussed compliance to current medication regimen, and patient endorses understanding.  Discussed the following psychosocial stressors: Encouraged to attend unit group activities and therapeutic milieu which could improve her mood.  Patient states she does not like to come out of her room because she is different and people may laugh at her.  Emotional support provided for ongoing stressors.  Also discussed improving her hygiene needs and instruct nursing staff to provide ADLs supplies for patient.  May follow-up with room lockout orders if patient continues to refuse to get out of the room.  Principal Problem: Schizoaffective disorder, depressive type (HCC)   Diagnosis: Principal Problem:   Schizoaffective disorder, depressive type (HCC) Active Problems:   GERD (gastroesophageal reflux disease)   Intellectual disability   Tobacco use disorder  Past Psychiatric History: Patient is not forth coming with the answers to the assessment questions. She is refusing to provide information about her mental health hx or symptoms.   Past Medical History:  Past Medical History:  Diagnosis Date   Bipolar affect, depressed (HCC)    Constipation 08/17/2022   Depression    Falls 07/21/2022   Fracture of femoral  neck, right, closed (HCC) 01/17/2022   Herpes simplex 08/22/2017   Open fracture dislocation of right elbow joint 01/17/2022    Past Surgical History:  Procedure Laterality Date   NO PAST SURGERIES     SALPINGECTOMY     Family History: History reviewed. No  pertinent family history.  Family Psychiatric  History: See H&P.  Social History:  Social History   Substance and Sexual Activity  Alcohol Use Yes     Social History   Substance and Sexual Activity  Drug Use Yes   Types: Cocaine, Marijuana    Social History   Socioeconomic History   Marital status: Single    Spouse name: Not on file   Number of children: Not on file   Years of education: Not on file   Highest education level: Not on file  Occupational History   Not on file  Tobacco Use   Smoking status: Every Day   Smokeless tobacco: Not on file  Substance and Sexual Activity   Alcohol use: Yes   Drug use: Yes    Types: Cocaine, Marijuana   Sexual activity: Yes  Other Topics Concern   Not on file  Social History Narrative   Not on file   Social Determinants of Health   Financial Resource Strain: Low Risk  (09/12/2022)   Received from [facility]   Overall Financial Resource Strain (CARDIA)    Difficulty of Paying Living Expenses: Not hard at all  Food Insecurity: Patient Declined (12/20/2022)   Hunger Vital Sign    Worried About Running Out of Food in the Last Year: Patient declined    Ran Out of Food in the Last Year: Patient declined  Transportation Needs: No Transportation Needs (12/20/2022)   PRAPARE - Administrator, Civil Service (Medical): No    Lack of Transportation (Non-Medical): No  Physical Activity: Not on file  Stress: No Stress Concern Present (07/17/2022)   Received from [facility] - Occupational Stress Questionnaire    Feeling of Stress : Not at all  Social Connections: Unknown (07/16/2022)   Received from [facility]   Social Network    Social Network: Not on file   Current Medications: Current Facility-Administered Medications  Medication Dose Route Frequency Provider Last Rate Last Admin   acetaminophen (TYLENOL) tablet 650 mg  650 mg Oral Q6H PRN [name], NP   650 mg at  01/19/23 1855   alum & mag hydroxide-simeth (MAALOX/MYLANTA) 200-200-20 MG/5ML suspension 30 mL  30 mL Oral Q4H PRN [name], NP       busPIRone (BUSPAR) tablet 15 mg  15 mg Oral BID [name], NP   15 mg at 01/23/23 0816   cyanocobalamin (VITAMIN B12) injection 1,000 mcg  1,000 mcg Intramuscular Weekly Attiah, Nadir, MD   1,000 mcg at 01/19/23 1244   Followed by   Melene Muller ON 02/02/2023] cyanocobalamin (VITAMIN B12) injection 1,000 mcg  1,000 mcg Intramuscular Q30 days [name], Nadir, MD       diphenhydrAMINE (BENADRYL) capsule 50 mg  50 mg Oral TID PRN [name], NP   50 mg at 01/15/23 1211   Or   diphenhydrAMINE (BENADRYL) injection 50 mg  50 mg Intramuscular TID PRN [name], NP       feeding supplement (ENSURE ENLIVE / ENSURE PLUS) liquid 237 mL  237 mL Oral TID BM Nkwenti, Doris, NP   237 mL at 01/22/23 2058   haloperidol (HALDOL) tablet  5 mg  5 mg Oral TID PRN [name], NP   5 mg at 01/15/23 1211   Or   haloperidol lactate (HALDOL) injection 5 mg  5 mg Intramuscular TID PRN [name], NP       hydrOXYzine (ATARAX) tablet 25 mg  25 mg Oral TID PRN [name], DO   25 mg at 01/22/23 2059   LORazepam (ATIVAN) tablet 2 mg  2 mg Oral TID PRN [name], NP   2 mg at 01/15/23 1211   Or   LORazepam (ATIVAN) injection 2 mg  2 mg Intramuscular TID PRN [name], NP       magnesium hydroxide (MILK OF MAGNESIA) suspension 30 mL  30 mL Oral Daily PRN [name], NP       melatonin tablet 5 mg  5 mg Oral QHS Nkwenti, Doris, NP   5 mg at 01/22/23 2059   nicotine (NICODERM CQ - dosed in mg/24 hours) patch 21 mg  21 mg Transdermal Daily [name], DO   21 mg at 01/23/23 0816   paliperidone (INVEGA) 24 hr tablet 6 mg  6 mg Oral Daily [name], NP   6 mg at 01/22/23 2059   pantoprazole (PROTONIX) EC tablet 40 mg  40 mg Oral Daily [name], NP   40 mg at 01/23/23 0816   polyethylene glycol (MIRALAX / GLYCOLAX) packet 17 g  17 g Oral BID [name],  DO   17 g at 01/23/23 0816   sertraline (ZOLOFT) tablet 100 mg  100 mg Oral Daily Park Pope, MD   100 mg at 01/23/23 0816   traZODone (DESYREL) tablet 50 mg  50 mg Oral QHS PRN Cecilie Lowers, FNP   50 mg at 01/22/23 2100   Lab Results:  No results found for this or any previous visit (from the past 48 hour(s)).  Blood Alcohol level:  Lab Results  Component Value Date   ETH <10 12/19/2022   ETH <10 11/21/2019   Metabolic Disorder Labs: Lab Results  Component Value Date   HGBA1C 4.4 (L) 12/21/2022   MPG 79.58 12/21/2022   No results found for: "PROLACTIN" Lab Results  Component Value Date   CHOL 218 (H) 12/21/2022   TRIG 81 12/21/2022   HDL 53 12/21/2022   CHOLHDL 4.1 12/21/2022   VLDL 16 12/21/2022   LDLCALC 149 (H) 12/21/2022   LDLCALC 110 (H) 03/13/2011   Physical Findings: AIMS: Facial and Oral Movements Muscles of Facial Expression: None, normal Lips and Perioral Area: None, normal Jaw: None, normal Tongue: None, normal,Extremity Movements Upper (arms, wrists, hands, fingers): None, normal Lower (legs, knees, ankles, toes): None, normal, Trunk Movements Neck, shoulders, hips: None, normal, Overall Severity Severity of abnormal movements (highest score from questions above): None, normal Incapacitation due to abnormal movements: None, normal Patient's awareness of abnormal movements (rate only patient's report): No Awareness, Dental Status Current problems with teeth and/or dentures?: No Does patient usually wear dentures?: No   Musculoskeletal: Strength & Muscle Tone: within normal limits Gait & Station: normal Patient leans: N/A  Psychiatric Specialty Exam:  Presentation  General Appearance:  Lying down in bed, better hygiene noted  Eye Contact: Fair Speech: Garbled  Speech Volume: Decreased  Handedness: Right  Mood and Affect  Mood: Anxious; Depressed  Affect: Flat; Congruent  Thought Process  Thought  Processes: Disorganized  Descriptions of Associations:Tangential  Orientation:Full (Time, Place and Person)  Thought Content: Denies SI HI or AVH does not  present responding to stimuli. Presentation consistent with moderate intellectual disability  History of Schizophrenia/Schizoaffective disorder:Yes  Duration of Psychotic Symptoms:Greater than six months  Hallucinations:Hallucinations: None Description of Auditory Hallucinations: Denies  Ideas of Reference:None  Suicidal Thoughts:Suicidal Thoughts: No SI Active Intent and/or Plan: -- (Denies) SI Passive Intent and/or Plan: -- (Denies)  Homicidal Thoughts:Homicidal Thoughts: No  Sensorium  Memory: Immediate Fair; Recent Fair  Judgment: Limited Insight: Limited Executive Functions  Concentration: Fair  Attention Span: Fair  Recall: [employer] of Knowledge: Fair  Language: Fair  Psychomotor Activity  Psychomotor Activity: Psychomotor Activity: Normal  Assets  Assets: Communication Skills; Desire for Improvement; Physical Health; Resilience  Sleep  Sleep: Sleep: Good Number of Hours of Sleep: 7  Physical Exam: Physical Exam Vitals and nursing note reviewed.  Constitutional:      General: She is not in acute distress.    Appearance: She is obese. She is not ill-appearing or diaphoretic.  HENT:     Head: Normocephalic.     Comments: Intellectual disability    Nose: No congestion.     Mouth/Throat:     Pharynx: Oropharynx is clear.  Eyes:     Pupils: Pupils are equal, round, and reactive to light.  Cardiovascular:     Rate and Rhythm: Normal rate.     Pulses: Normal pulses.  Pulmonary:     Effort: Pulmonary effort is normal. No respiratory distress.  Abdominal:     Comments: Deferred  Genitourinary:    Comments: Deferred Musculoskeletal:        General: Normal range of motion.     Cervical back: Normal range of motion.  Skin:    General: Skin is warm and dry.  Neurological:      General: No focal deficit present.     Mental Status: She is alert.  Psychiatric:     Comments: Mood is sad    Blood pressure 98/72, pulse 95, temperature 98.2 F (36.8 C), temperature source Oral, resp. rate 18, height 4\' 11"  (1.499 m), weight 63 kg, SpO2 100%. Body mass index is 28.05 kg/m.  Treatment Plan Summary: Daily contact with patient to assess and evaluate symptoms and progress in treatment and Medication management.   Still waiting on safe disposition as patient would not be able to live independently given cognitive impairment. Recommend assisted living.   No medication side effects.  Because of urinary incontinence, recommended adult diapers to be worn.  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  Plan:  -Continue Buspar 15 mg po bid for anxiety.  -Continue paliperidone 6 mg po qd for mood stabilization -Continued Sertraline 100 mg po Q daily for depression/anxiety -Continue Trazodone 50 mg po prn for insomnia -Continue Nicoderm 21 mg topically Q 24 hrs for nicotine withdrawal management. -Continue vitamin B12 1000 mcg IM weekly for 4 weeks then monthly -Continue Melatonin 5 mg nightly for sleep Continue Protonix EC 40 mg p.o. daily for GERD -Continue MiraLAX 17 g p.o. twice daily for constipation -Continue hydroxyzine 25 mg p.o. 3 times daily as needed for anxiety -Continue Ensure nutritional shakes TID in between meals  -Previously discontinued Hydroxyzine 50 mg nightly-hypotension in the mornings  Lab work and EKG, reviewed with no significant abnormalities noted, except slightly low b12 166.  01/05/2023, started on B12 1000 mcg injections q.  weekly x 4 weeks, then q. monthly.  Safety and  Monitoring: Voluntary admission to inpatient psychiatric unit for safety, stabilization and treatment Daily contact with patient to assess and evaluate symptoms and progress in treatment Patient's case to be discussed in multi-disciplinary team meeting Observation Level : q15 minute checks Vital signs: q12 hours Precautions: Safety   Discharge Planning: Social work and case management to assist with discharge planning and identification of hospital follow-up needs prior to discharge Estimated LOS: Unknown at this time. Discharge Concerns: Need to establish a safety plan; Medication compliance and effectiveness Discharge Goals: Return home with outpatient referrals for mental health follow-up including medication management/psychotherapy No legal guardian, has payee  Cecilie Lowers, FNP. 01/23/2023, 12:35 PM Patient ID: Larey Brick, female   DOB: 22-Oct-1975, 47 y.o.    MRN: [id number] Patient ID: BEATRYCE COLOMBO, female   DOB: [birth year], 47 y.o.   MRN: [id number] Patient ID: NIASHA DEVINS, female   DOB: 1976-01-21, 47 y.o.   MRN: [id number] Patient ID: ANELLA NAKATA, female   DOB: Dec 03, 1975, 47 y.o.   MRN: [id number] Patient ID: CHENITA RUDA, female   DOB: [birth year], 47 y.o.   MRN: [id number] Patient ID: KEISHANA KLINGER, female   DOB: 02/28/1976, 47 y.o.   MRN: [id number] Patient ID: DARICE VICARIO, female   DOB: Dec 29, 1975, 47 y.o.   MRN: [id number] Patient ID: DEVAEH AMADI, female   DOB: 08/16/1975, 47 y.o.   MRN: [id number] Patient ID: JALIAH FOODY, female   DOB: 08/15/1975, 47 y.o.   MRN: [id number] Patient ID: AHNA KONKLE, female   DOB: [birth year], 47 y.o.   MRN: [id number]

## 2023-01-23 NOTE — Progress Notes (Signed)
   01/23/23 0543  15 Minute Checks  Location Bedroom  Visual Appearance Calm  Behavior Sleeping  Sleep (Behavioral Health Patients Only)  Calculate sleep? (Click Yes once per 24 hr at 0600 safety check) Yes  Documented sleep last 24 hours 8.75

## 2023-01-23 NOTE — Plan of Care (Signed)

## 2023-01-23 NOTE — [facility] Group Notes (Signed)
Spiritual care group facilitated by Chaplain Dyanne Carrel, B[facility] and Chaplain Arlyce Dice  Group focused on topic of strength. Group members reflected on what thoughts and feelings emerge when they hear this topic. They then engaged in facilitated dialog around how strength is present in their lives. This dialog focused on representing what strength had been to them in their lives (images and patterns given) and what they saw as helpful in their life now (what they needed / wanted).  Activity drew on narrative framework.  Patient Progress: Did not attend.

## 2023-01-23 NOTE — [facility] Group Notes (Addendum)
[facility] Group Notes:  (Nursing/MHT/Case Management/Adjunct)  Date:  01/23/2023  Time:  11:59 AM  Type of Therapy:  Psychoeducational Skills  Participation Level:  Did Not Attend  Participation Quality:  na  Affect:  na  Cognitive:  na  Insight:  None  Engagement in Group:  na  Modes of Intervention:  na  Summary of Progress/Problems: did not attend.  Malva Limes 01/23/2023, 11:59 AM

## 2023-01-23 NOTE — B[facility] Group Notes (Signed)
Adult Psychoeducational Group Note  Date:  01/23/2023 Time:  10:21 AM  Group Topic/Focus:  Emotional Education:   The focus of this group is to discuss what feelings/emotions are, and how they are experienced. Goals Group:   The focus of this group is to help patients establish daily goals to achieve during treatment and discuss how the patient can incorporate goal setting into their daily lives to aide in recovery.  Participation Level:  Did Not Attend  Participation Quality:    Affect:    Cognitive:    Insight:   Engagement in Group:    Modes of Intervention:    Additional Comments:  Pt did not attend group today  Burnett Sheng 01/23/2023, 10:21 AM

## 2023-01-23 NOTE — Group Note (Signed)
Recreation Therapy Group Note   Group Topic:Team Building  Group Date: 01/23/2023 Start Time: 0935 End Time: 1000 Facilitators: Ulani Degrasse-McCall, LRT,CTRS Location: [address] Dayroom   Goal Area(s) Addresses:  Patient will effectively work with peer towards shared goal.  Patient will identify skills used to make activity successful.  Patient will share challenges and verbalize solution-driven approaches used. Patient will identify how skills used during activity can be used to reach post d/c goals.    Group Description: [employer]. Patients were provided the following materials: 4 drinking straws, 5 rubber bands, 5 paper clips, 2 index cards and 2 drinking cups. Using the provided materials patients were asked to build a launching mechanism to launch a ping pong ball across the room, approximately 10 feet. Patients were divided into teams of 3-5. Instructions required all materials be incorporated into the device, functionality of items left to the peer group's discretion.   Affect/Mood: N/A   Participation Level: Did not attend    Clinical Observations/Individualized Feedback:    Plan: Continue to engage patient in RT group sessions 2-3x/week.   Mavis Gravelle-McCall, LRT,CTRS 01/23/2023 12:13 PM

## 2023-01-24 DIAGNOSIS — F251 Schizoaffective disorder, depressive type: Secondary | ICD-10-CM | POA: Diagnosis not present

## 2023-01-24 NOTE — [facility] Group Notes (Signed)
[place] did not attend wrap up group

## 2023-01-24 NOTE — Progress Notes (Signed)
   01/24/23 0634  Vital Signs  Temp 97.6 F (36.4 C)  Temp Source Oral  Pulse Rate 83  Pulse Rate Source Monitor  Resp 20  BP 90/63  BP Location Left Arm  BP Method Automatic  Patient Position (if appropriate) Sitting  Oxygen Therapy  SpO2 100 %  O2 Device Room Air   Patient hydrated with Gatorade and encouraged to continue drinking throughout the day.

## 2023-01-24 NOTE — B[facility] Counselor (Signed)
CSW and pt met in the [facility] conference at 1400 pm with DSS Worker Ms. Lucila Maine. Discussed the purpose of the meeting and concerns about placement. Ms. [name] contacted "[facility] " with [employer] [facility]  [phone]. Ms. [name] attempted to reach the Care Manager "[place]". Ms. [name] left a voicemail and indicates that she will send an email as well. CSW Will continue to monitor.

## 2023-01-24 NOTE — Group Note (Signed)
Date:  01/24/2023 Time:  12:20 PM                                                 B[facility] LCSW Group Therapy Note    Group Date: @GROUPDATE @ Start Time: @GROUPSTARTTIME @ End Time: @GROUPENDTIME @  Type of Therapy and Topic:  Group Therapy:  Overcoming Obstacles  Participation Level:    Mood:  Description of Group:   In this group patients will be encouraged to explore what they see as obstacles to their own wellness and recovery. They will be guided to discuss their thoughts, feelings, and behaviors related to these obstacles. The group will process together ways to cope with barriers, with attention given to specific choices patients can make. Each patient will be challenged to identify changes they are motivated to make in order to overcome their obstacles. This group will be process-oriented, with patients participating in exploration of their own experiences as well as giving and receiving support and challenge from other group members.  Therapeutic Goals: 1. Patient will identify personal and current obstacles as they relate to admission. 2. Patient will identify barriers that currently interfere with their wellness or overcoming obstacles.  3. Patient will identify feelings, thought process and behaviors related to these barriers. 4. Patient will identify two changes they are willing to make to overcome these obstacles:    Summary of Patient Progress      Therapeutic Modalities:   Cognitive Behavioral Therapy Solution Focused Therapy Motivational Interviewing Relapse Prevention Therapy   Yolanda Gram M ChriscoGroup Topic/Focus:  Developing a Wellness Toolbox:   The focus of this group is to help patients develop a "wellness toolbox" with skills and strategies to promote recovery upon discharge. Wellness Toolbox:   The focus of this group is to discuss various aspects of wellness, balancing those aspects and exploring ways to increase the ability to experience wellness.  Patients  will create a wellness toolbox for use upon discharge.    Participation Level:  Did Not Attend  Participation Quality:    Affect:    Cognitive:    Insight:   Engagement in Group:    Modes of Intervention:    Additional Comments:    Yolanda [name] 01/24/2023, 12:20 PM

## 2023-01-24 NOTE — Progress Notes (Signed)
   01/24/23 0800  Psych Admission Type (Psych Patients Only)  Admission Status Voluntary  Psychosocial Assessment  Patient Complaints Depression;Crying spells  Eye Contact Brief  Facial Expression Sad  Affect Sad;Depressed  Speech Logical/coherent  Interaction Childlike  Motor Activity Slow;Tremors  Appearance/Hygiene Poor hygiene;Disheveled  Behavior Characteristics Appropriate to situation  Mood Depressed;Sad;Helpless;Worthless, low self-esteem  Thought Process  Coherency WDL  Content WDL  Delusions None reported or observed  Perception WDL  Hallucination None reported or observed  Judgment Poor  Confusion None  Danger to Self  Current suicidal ideation? Passive  Self-Injurious Behavior Some self-injurious ideation observed or expressed.  No lethal plan expressed   Agreement Not to Harm Self Yes  Description of Agreement verbal  Danger to Others  Danger to Others None reported or observed

## 2023-01-24 NOTE — Progress Notes (Signed)
Patient ID: Yolanda [name], female   DOB: 05-18-1976, 47 y.o.   MRN: [id number] [facility] MD Progress Note  01/24/2023 11:24 AM Yolanda [name]  MRN:  [id number]  Reason for admission: This is the first psychiatric admission in this [facility] in 12 years for this 86 AA female with an extensive hx of mental illnesses & probable polysubstance use disorders. She is admitted to the [facility] from the [facility] hospital with complain of worsening suicidal ideations with plan to stab herself. Per chart review, patient apparently reported at the ED that she has been depressed for a while & has not been taking her mental health medications. After medical evaluation.clearance, she was transferred to the [facility] for further psychiatric evaluation/treatments.   APS report on 6/24 Has payee, no legal guardian Continues to await placement.  Yesterday the psychiatry team made the following recommendations:  -Continue Buspar 15 mg po bid for anxiety.  -Continue Paliperidone 6 mg po qd for mood stabilization -Continued Sertraline 100 mg po Q daily for depression/anxiety -Continue Trazodone 50 mg po Q bedtime to q. nightly prn for insomnia  -Continue Nicoderm 21 mg topically Q 24 hrs for nicotine withdrawal management  -Continue Vitamin B12 1000 mcg IM weekly for 4 weeks then monthly -Continue Melatonin 5 mg nightly for sleep -Continue Ensure nutritional shakes TID in between meals  -Continue Protonix EC 40 mg p.o. daily for GERD -Continue MiraLAX 17 g p.o. twice daily for constipation -Continue hydroxyzine 25 mg p.o. 3 times daily as needed for anxiety -Previously discontinued Hydroxyzine 50 mg nightly-hypotension in the mornings  On assessment today, the pt presents with labile mood and physically crying.  Reporting that she prefers to stay in her room, rather than go to the day room because other patients makes fun of her.  This perception may be chronic delusion from patient's mental illness or her intellectual  disability.  Patient was taken from her room willingly to the office for assessment.  When asked what was bothering her, reports that, "I am different from other people. My medications are not working for me because I am still here in the hospital and has nowhere else to go."  This provider made pt aware that the LCSW is working with DSS towards  finding her guardianship, and hopefully this will happen soon so that she could be discharged.  Patient brightened up when she talked about her two children and grandson who is  34 years old.  Patient reports, "My daughter, 'Yolanda [name]' birthday is on January 26, 2023, and I will be missing her birthday. Emotional support provided for ongoing stressors.     Reports that anxiety/depression is high this morning and rates as #10/10, with 10 being high severity.  When asked about the reason for high rating for anxiety and depression, patient states, "I do not know."  Nursing staff report patient sleeping about 8 hours last night.    Appetite is good and observed several snacks, Ensure supplements and other drinks around her bedside.  Concentration is fair Energy level is adequate Denies suicidal thoughts.  Denies suicidal intent, or plans.   Denies having any HI.  Denies psychotic symptoms today.  Denies having side effects to current psychiatric medications.   We discussed compliance to current medication regimen, and patient endorses understanding.  Discussed the following psychosocial stressors: Encouraged to attend unit group activities and therapeutic milieu which could improve her mood. Also discussed improving her hygiene needs and instruct nursing staff to provide ADLs supplies for  patient.  Patient was taken out of her room to an open meeting area on the unit to socialize with other patients.  Snacks, drinks, and activity materials were provided for patient's while in this area to occupy her time while socializing.  Principal Problem: Schizoaffective  disorder, depressive type (HCC)   Diagnosis: Principal Problem:   Schizoaffective disorder, depressive type (HCC) Active Problems:   GERD (gastroesophageal reflux disease)   Intellectual disability   Tobacco use disorder  Past Psychiatric History: Patient is not forth coming with the answers to the assessment questions. She is refusing to provide information about her mental health hx or symptoms.   Past Medical History:  Past Medical History:  Diagnosis Date   Bipolar affect, depressed (HCC)    Constipation 08/17/2022   Depression    Falls 07/21/2022   Fracture of femoral neck, right, closed (HCC) 01/17/2022   Herpes simplex 08/22/2017   Open fracture dislocation of right elbow joint 01/17/2022    Past Surgical History:  Procedure Laterality Date   NO PAST SURGERIES     SALPINGECTOMY     Family History: History reviewed. No pertinent family history.  Family Psychiatric  History: See H&P.  Social History:  Social History   Substance and Sexual Activity  Alcohol Use Yes     Social History   Substance and Sexual Activity  Drug Use Yes   Types: Cocaine, Marijuana    Social History   Socioeconomic History   Marital status: Single    Spouse name: Not on file   Number of children: Not on file   Years of education: Not on file   Highest education level: Not on file  Occupational History   Not on file  Tobacco Use   Smoking status: Every Day   Smokeless tobacco: Not on file  Substance and Sexual Activity   Alcohol use: Yes   Drug use: Yes    Types: Cocaine, Marijuana   Sexual activity: Yes  Other Topics Concern   Not on file  Social History Narrative   Not on file   Social Determinants of Health   Financial Resource Strain: Low Risk  (09/12/2022)   Received from [facility]   Overall Financial Resource Strain (CARDIA)    Difficulty of Paying Living Expenses: Not hard at all  Food Insecurity: Patient Declined (12/20/2022)   Hunger Vital Sign    Worried  About Running Out of Food in the Last Year: Patient declined    Ran Out of Food in the Last Year: Patient declined  Transportation Needs: No Transportation Needs (12/20/2022)   PRAPARE - Administrator, Civil Service (Medical): No    Lack of Transportation (Non-Medical): No  Physical Activity: Not on file  Stress: No Stress Concern Present (07/17/2022)   Received from [facility] - Occupational Stress Questionnaire    Feeling of Stress : Not at all  Social Connections: Unknown (07/16/2022)   Received from [facility]   Social Network    Social Network: Not on file   Current Medications: Current Facility-Administered Medications  Medication Dose Route Frequency Provider Last Rate Last Admin   acetaminophen (TYLENOL) tablet 650 mg  650 mg Oral Q6H PRN [name], NP   650 mg at 01/19/23 1855   alum & mag hydroxide-simeth (MAALOX/MYLANTA) 200-200-20 MG/5ML suspension 30 mL  30 mL Oral Q4H PRN [name], NP       busPIRone (BUSPAR) tablet 15 mg  15  mg Oral BID [name], NP   15 mg at 01/24/23 8295   cyanocobalamin (VITAMIN B12) injection 1,000 mcg  1,000 mcg Intramuscular Weekly Attiah, Nadir, MD   1,000 mcg at 01/19/23 1244   Followed by   Melene Muller ON 02/02/2023] cyanocobalamin (VITAMIN B12) injection 1,000 mcg  1,000 mcg Intramuscular Q30 days [name], Nadir, MD       diphenhydrAMINE (BENADRYL) capsule 50 mg  50 mg Oral TID PRN [name], NP   50 mg at 01/15/23 1211   Or   diphenhydrAMINE (BENADRYL) injection 50 mg  50 mg Intramuscular TID PRN [name], NP       feeding supplement (ENSURE ENLIVE / ENSURE PLUS) liquid 237 mL  237 mL Oral TID BM Nkwenti, Doris, NP   237 mL at 01/24/23 0956   haloperidol (HALDOL) tablet 5 mg  5 mg Oral TID PRN [name], NP   5 mg at 01/15/23 1211   Or   haloperidol lactate (HALDOL) injection 5 mg  5 mg Intramuscular TID PRN [name], NP       hydrOXYzine (ATARAX) tablet 25  mg  25 mg Oral TID PRN [name], DO   25 mg at 01/23/23 2036   LORazepam (ATIVAN) tablet 2 mg  2 mg Oral TID PRN [name], NP   2 mg at 01/15/23 1211   Or   LORazepam (ATIVAN) injection 2 mg  2 mg Intramuscular TID PRN [name], NP       magnesium hydroxide (MILK OF MAGNESIA) suspension 30 mL  30 mL Oral Daily PRN [name], NP       melatonin tablet 5 mg  5 mg Oral QHS Nkwenti, Doris, NP   5 mg at 01/23/23 2035   nicotine (NICODERM CQ - dosed in mg/24 hours) patch 21 mg  21 mg Transdermal Daily [name], DO   21 mg at 01/24/23 0813   paliperidone (INVEGA) 24 hr tablet 6 mg  6 mg Oral Daily [name], NP   6 mg at 01/23/23 2036   pantoprazole (PROTONIX) EC tablet 40 mg  40 mg Oral Daily [name], NP   40 mg at 01/24/23 0814   polyethylene glycol (MIRALAX / GLYCOLAX) packet 17 g  17 g Oral BID [name], DO   17 g at 01/24/23 0813   sertraline (ZOLOFT) tablet 100 mg  100 mg Oral Daily Park Pope, MD   100 mg at 01/24/23 0814   traZODone (DESYREL) tablet 50 mg  50 mg Oral QHS PRN Cecilie Lowers, FNP   50 mg at 01/23/23 2036   Lab Results:  No results found for this or any previous visit (from the past 48 hour(s)).  Blood Alcohol level:  Lab Results  Component Value Date   ETH <10 12/19/2022   ETH <10 11/21/2019   Metabolic Disorder Labs: Lab Results  Component Value Date   HGBA1C 4.4 (L) 12/21/2022   MPG 79.58 12/21/2022   No results found for: "PROLACTIN" Lab Results  Component Value Date   CHOL 218 (H) 12/21/2022   TRIG 81 12/21/2022   HDL 53 12/21/2022   CHOLHDL 4.1 12/21/2022   VLDL 16 12/21/2022   LDLCALC 149 (H) 12/21/2022   LDLCALC 110 (H) 03/13/2011   Physical Findings: AIMS: Facial and Oral Movements Muscles of Facial Expression: None, normal Lips and Perioral Area: None, normal Jaw: None, normal Tongue: None, normal,Extremity Movements Upper (arms, wrists, hands, fingers): None, normal Lower (legs, knees,  ankles, toes): None,  normal, Trunk Movements Neck, shoulders, hips: None, normal, Overall Severity Severity of abnormal movements (highest score from questions above): None, normal Incapacitation due to abnormal movements: None, normal Patient's awareness of abnormal movements (rate only patient's report): No Awareness, Dental Status Current problems with teeth and/or dentures?: No Does patient usually wear dentures?: No   Musculoskeletal: Strength & Muscle Tone: within normal limits Gait & Station: normal Patient leans: N/A  Psychiatric Specialty Exam:  Presentation  General Appearance:  Lying down in bed, better hygiene noted  Eye Contact: Fair Speech: Garbled  Speech Volume: Decreased  Handedness: Right  Mood and Affect  Mood: Anxious; Depressed; Labile; Worthless  Affect: Civil Service fast streamer  Thought Processes: Disorganized  Descriptions of Associations:Tangential  Orientation:Full (Time, Place and Person)  Thought Content: Denies SI HI or AVH does not present responding to stimuli. Presentation consistent with moderate intellectual disability  History of Schizophrenia/Schizoaffective disorder:Yes  Duration of Psychotic Symptoms:Greater than six months  Hallucinations:Hallucinations: None Description of Auditory Hallucinations: Denies today  Ideas of Reference:None  Suicidal Thoughts:Suicidal Thoughts: No SI Active Intent and/or Plan: -- (denies) SI Passive Intent and/or Plan: -- (Denies)  Homicidal Thoughts:Homicidal Thoughts: No  Sensorium  Memory: Immediate Fair; Recent Fair  Judgment: Limited Insight: Limited Executive Functions  Concentration: Fair  Attention Span: Fair  Recall: [employer] of Knowledge: Fair  Language: Fair  Psychomotor Activity  Psychomotor Activity: Psychomotor Activity: Normal  Assets  Assets: Communication Skills; Desire for Improvement; Physical Health; Resilience  Sleep  Sleep: Sleep: Good Number  of Hours of Sleep: 8  Physical Exam: Physical Exam Vitals and nursing note reviewed.  Constitutional:      General: She is not in acute distress.    Appearance: She is obese. She is not ill-appearing or diaphoretic.  HENT:     Head: Normocephalic.     Comments: Intellectual disability    Nose: No congestion.     Mouth/Throat:     Pharynx: Oropharynx is clear.  Eyes:     Pupils: Pupils are equal, round, and reactive to light.  Cardiovascular:     Rate and Rhythm: Normal rate.     Pulses: Normal pulses.  Pulmonary:     Effort: Pulmonary effort is normal. No respiratory distress.  Abdominal:     Comments: Deferred  Genitourinary:    Comments: Deferred Musculoskeletal:        General: Normal range of motion.     Cervical back: Normal range of motion.  Skin:    General: Skin is warm and dry.  Neurological:     General: No focal deficit present.     Mental Status: She is alert.  Psychiatric:     Comments: Mood is labile and sad.   Blood pressure 90/63, pulse 83, temperature 97.6 F (36.4 C), temperature source Oral, resp. rate 20, height 4\' 11"  (1.499 m), weight 63 kg, SpO2 100%. Body mass index is 28.05 kg/m.  Treatment Plan Summary: Daily contact with patient to assess and evaluate symptoms and progress in treatment and Medication management.   Still waiting on safe disposition as patient would not be able to live independently given cognitive impairment. Recommend assisted living.   No medication side effects.  Because of urinary incontinence, recommended adult diapers to be worn.  Principal/active diagnoses:  Principal Problem:   Schizoaffective disorder, depressive type (HCC) Active Problems:   GERD (gastroesophageal reflux disease)   Intellectual disability   Tobacco use disorder (MOCA 16/30) moderate cognitive impairment  probably related to moderate intellectual disability.  Plan:  -Continue Buspar 15 mg po bid for anxiety.  -Continue paliperidone 6 mg po  qd for mood stabilization -Continued Sertraline 100 mg po Q daily for depression/anxiety -Continue Trazodone 50 mg po prn for insomnia -Continue Nicoderm 21 mg topically Q 24 hrs for nicotine withdrawal management. -Continue vitamin B12 1000 mcg IM weekly for 4 weeks then monthly -Continue Melatonin 5 mg nightly for sleep -Continue Protonix EC 40 mg p.o. daily for GERD -Continue MiraLAX 17 g p.o. twice daily for constipation -Continue hydroxyzine 25 mg p.o. 3 times daily as needed for anxiety -Continue Ensure nutritional shakes TID in between meals  -Previously discontinued Hydroxyzine 50 mg nightly-hypotension in the mornings  Lab work and EKG, reviewed with no significant abnormalities noted, except slightly low b12 166.  01/05/2023, started on B12 1000 mcg injections q. weekly x 4 weeks, then q. monthly.  Safety and Monitoring: Voluntary admission to inpatient psychiatric unit for safety, stabilization and treatment Daily contact with patient to assess and evaluate symptoms and progress in treatment Patient's case to be discussed in multi-disciplinary team meeting Observation Level : q15 minute checks Vital signs: q12 hours Precautions: Safety   Discharge Planning: Social work and case management to assist with discharge planning and identification of hospital follow-up needs prior to discharge Estimated LOS: Unknown at this time. Discharge Concerns: Need to establish a safety plan; Medication compliance and effectiveness Discharge Goals: Return home with outpatient referrals for mental health follow-up including medication management/psychotherapy No legal guardian, has payee  Cecilie Lowers, FNP. 01/24/2023, 11:24 AM Patient ID: Larey Brick, female   DOB: 1975/10/21, 47 y.o.    MRN: [id number] Patient ID: KALAH PFLUM, female   DOB: [birth year], 47 y.o.   MRN: [id number] Patient ID: MADINA GALATI, female   DOB: 05-04-1976, 47 y.o.   MRN: [id number] Patient ID: HEBAH BOGOSIAN, female   DOB: 03-24-1976, 47 y.o.   MRN: [id number] Patient ID: TIFFINY WORTHY, female   DOB: October 06, 1975, 47 y.o.   MRN: [id number] Patient ID: HANIAH PENNY, female   DOB: [birth year], 47 y.o.   MRN: [id number] Patient ID: DANYLLE OUK, female   DOB: August 17, 1975, 47 y.o.   MRN: [id number] Patient ID: SUZANN LAZARO, female   DOB: 08/29/1975, 47 y.o.   MRN: [id number] Patient ID: ESTHA FEW, female   DOB: 01/01/1976, 47 y.o.   MRN: [id number] Patient ID: KENLYNN HOUDE, female   DOB: 1975-07-20, 47 y.o.   MRN: [id number] Patient ID: KETRA DUCHESNE, female   DOB: 1975-08-19, 47 y.o.   MRN: [id number]

## 2023-01-24 NOTE — Progress Notes (Signed)
   01/24/23 2204  Psych Admission Type (Psych Patients Only)  Admission Status Voluntary  Psychosocial Assessment  Patient Complaints Sleep disturbance  Eye Contact Brief  Facial Expression Sad  Affect Sad;Depressed  Speech Soft;Logical/coherent  Interaction Childlike  Motor Activity Slow  Appearance/Hygiene Disheveled;Poor hygiene  Behavior Characteristics Appropriate to situation  Mood Depressed  Thought Process  Coherency WDL  Content WDL  Delusions WDL  Perception WDL  Hallucination None reported or observed  Judgment Poor  Confusion WDL  Danger to Self  Current suicidal ideation? Denies  Danger to Others  Danger to Others None reported or observed   Upon initial interaction pt was in bed resting, pt given much encouragement to join wrap up group, but refused. Pt ate 100% of her snack, pt states that she hears voices at times, but was unsure of what they were saying, denies SI/HI. (A) 15 min checks (r) safety maintained.

## 2023-01-24 NOTE — B[facility] Group Notes (Signed)
Adult Psychoeducational Group Note  Date:  01/24/2023 Time:  9:27 AM  Group Topic/Focus:  Goals Group:   The focus of this group is to help patients establish daily goals to achieve during treatment and discuss how the patient can incorporate goal setting into their daily lives to aide in recovery. Orientation:   The focus of this group is to educate the patient on the purpose and policies of crisis stabilization and provide a format to answer questions about their admission.  The group details unit policies and expectations of patients while admitted.  Participation Level:  Active  Participation Quality:  Appropriate  Affect:  Appropriate  Cognitive:  Appropriate  Insight: Appropriate  Engagement in Group:  Engaged  Modes of Intervention:  Discussion  Additional Comments:  Pt attended the goals group and remained appropriate and engaged throughout the duration of the group.   Sheran Lawless 01/24/2023, 9:27 AM

## 2023-01-24 NOTE — Plan of Care (Signed)
  Problem: Education: Goal: Utilization of techniques to improve thought processes will improve Outcome: Progressing Goal: Knowledge of the prescribed therapeutic regimen will improve Outcome: Progressing   Problem: Activity: Goal: Interest or engagement in leisure activities will improve Outcome: Progressing   

## 2023-01-25 DIAGNOSIS — F251 Schizoaffective disorder, depressive type: Secondary | ICD-10-CM

## 2023-01-25 MED ORDER — SERTRALINE HCL 50 MG PO TABS
150.0000 mg | ORAL_TABLET | Freq: Every day | ORAL | Status: DC
  Administered 2023-01-26 – 2023-08-01 (×187): 150 mg via ORAL
  Filled 2023-01-25 (×202): qty 3

## 2023-01-25 NOTE — Progress Notes (Signed)
   01/25/23 0617  Vital Signs  Pulse Rate 89  Pulse Rate Source Dinamap  BP (!) 71/58  BP Location Left Arm  BP Method Automatic  Patient Position (if appropriate) Standing   Received gatorade, fall precautions reinforced.

## 2023-01-25 NOTE — Progress Notes (Signed)
Patient received alert and oriented. Oriented to staff  and milieu. Denies SI/HI/AVH.  Anxiety 0/10 and depression 10/10.   States he had an ok day. Denies pain. Encouraged to drink fluids. Patient participated in group. Patient encouraged to come to staff with needs and problems. Denies needs at this time.  01/25/23 2158  Psych Admission Type (Psych Patients Only)  Admission Status Voluntary  Psychosocial Assessment  Patient Complaints Sleep disturbance  Eye Contact Brief  Facial Expression Flat;Sad  Affect Depressed;Sad  Speech Logical/coherent;Soft;Slow  Interaction Childlike  Motor Activity Slow  Appearance/Hygiene Disheveled  Behavior Characteristics Appropriate to situation  Mood Depressed;Sad  Thought Process  Coherency WDL  Content WDL  Delusions WDL  Perception WDL  Hallucination None reported or observed  Judgment Poor  Confusion WDL  Danger to Self  Current suicidal ideation? Denies  Agreement Not to Harm Self Yes  Description of Agreement verbal

## 2023-01-25 NOTE — Group Note (Signed)
Recreation Therapy Group Note   Group Topic:Communication  Group Date: 01/25/2023 Start Time: 1000 End Time: 1025 Facilitators: Emersen Mascari-McCall, LRT,CTRS Location: [address] Dayroom   Goal Area(s) Addresses:  Patient will effectively listen to complete activity.  Patient will identify communication skills used to make activity successful.  Patient will identify how skills used during activity can be used to reach post d/c goals.    Group Description: Geometric Drawings.  Three volunteers from the peer group will be shown an abstract picture with a particular arrangement of geometrical shapes.  Each round, one 'speaker' will describe the pattern, as accurately as possible without revealing the image to the group.  The remaining group members will listen and draw the picture to reflect how it is described to them. Patients with the role of 'listener' cannot ask clarifying questions but, may request that the speaker repeat a direction. Once the drawings are complete, the presenter will show the rest of the group the picture and compare how close each person came to drawing the picture. LRT will facilitate a post-activity discussion regarding effective communication and the importance of planning, listening, and asking for clarification in daily interactions with others.   Affect/Mood: N/A   Participation Level: Did not attend    Clinical Observations/Individualized Feedback:     Plan: Continue to engage patient in RT group sessions 2-3x/week.   Yolanda [name], LRT,CTRS 01/25/2023 12:17 PM

## 2023-01-25 NOTE — Progress Notes (Signed)
Patient ID: Yolanda [name], female   DOB: 1976-04-02, 47 y.o.   MRN: [id number] [facility] MD Progress Note  01/25/2023 1:50 PM Yolanda [name]  MRN:  [id number]  Reason for admission: This is the first psychiatric admission in this [facility] in 12 years for this 21 AA female with an extensive hx of mental illnesses & probable polysubstance use disorders. She is admitted to the [facility] from the [facility] hospital with complain of worsening suicidal ideations with plan to stab herself. Per chart review, patient apparently reported at the ED that she has been depressed for a while & has not been taking her mental health medications. After medical evaluation.clearance, she was transferred to the [facility] for further psychiatric evaluation/treatments.   APS report on 6/24 Has payee, no legal guardian Continues to await placement.  24 hr chart review: Sleep Hours last night:  Nursing Concerns: Refused to get up for breakfast, drank a half a cup of Gatorade with morning RN. Behavioral episodes in the past 24 hrs: As above Medication Compliance: Pt noted to be continuing to be med compliant Vital Signs in the past 24 hrs: V/S initially significantly low earlier today morning, but rechecked at 1107, and WNL.  PRN Medications in the past 24 hrs: Trazodone & Hydroxyzine given last night  Patient assessment note: Patient seen in her room lying in bed earlier today morning with covers over her head, presents with a depressed mood, affect congruent and blunted. Reports energy level as being low, reports low appetite, seems not to be tending to personal hygiene needs as hair looks matted, and unkempt, and overall, pt's presentation is disheveled and unkempt.   Patient reports that she had a bad day yesterday , stated "I had some really bad thoughts", states she had suicide thoughts, states that they have now resolved. She denies SI/HI/AVH currently, and denies paranoia, and denies delusional thoughts. Patient denies being in  any physical pain, encouraged to get out of bed, tend to personal hygiene needs, and attend unit group sessions. Pt also educated on the need to not keep drinks and other food items in her room as these might attract bugs to the unit. Pt states she is moving her bowels well, denies medication related side effects. Continues to be resistant about wearing incontinent briefs.  We will increase Sertraline to 150 mg for management of depressive symptoms, and continue other medications as listed below. Placement is continuing to be an issue, which is causing patient most of her decreased mood. CSW is continuing to work with DSS in an effot to secure safe placement for pt in the community. We will continue to monitor.  Principal Problem: Schizoaffective disorder, depressive type (HCC)   Diagnosis: Principal Problem:   Schizoaffective disorder, depressive type (HCC) Active Problems:   GERD (gastroesophageal reflux disease)   Intellectual disability   Tobacco use disorder  Past Psychiatric History: Patient is not forth coming with the answers to the assessment questions. She is refusing to provide information about her mental health hx or symptoms.   Past Medical History:  Past Medical History:  Diagnosis Date   Bipolar affect, depressed (HCC)    Constipation 08/17/2022   Depression    Falls 07/21/2022   Fracture of femoral neck, right, closed (HCC) 01/17/2022   Herpes simplex 08/22/2017   Open fracture dislocation of right elbow joint 01/17/2022    Past Surgical History:  Procedure Laterality Date   NO PAST SURGERIES     SALPINGECTOMY  Family History: History reviewed. No pertinent family history.  Family Psychiatric  History: See H&P.  Social History:  Social History   Substance and Sexual Activity  Alcohol Use Yes     Social History   Substance and Sexual Activity  Drug Use Yes   Types: Cocaine, Marijuana    Social History   Socioeconomic History   Marital status:  Single    Spouse name: Not on file   Number of children: Not on file   Years of education: Not on file   Highest education level: Not on file  Occupational History   Not on file  Tobacco Use   Smoking status: Every Day   Smokeless tobacco: Not on file  Substance and Sexual Activity   Alcohol use: Yes   Drug use: Yes    Types: Cocaine, Marijuana   Sexual activity: Yes  Other Topics Concern   Not on file  Social History Narrative   Not on file   Social Determinants of Health   Financial Resource Strain: Low Risk  (09/12/2022)   Received from [facility]   Overall Financial Resource Strain (CARDIA)    Difficulty of Paying Living Expenses: Not hard at all  Food Insecurity: Patient Declined (12/20/2022)   Hunger Vital Sign    Worried About Running Out of Food in the Last Year: Patient declined    Ran Out of Food in the Last Year: Patient declined  Transportation Needs: No Transportation Needs (12/20/2022)   PRAPARE - Administrator, Civil Service (Medical): No    Lack of Transportation (Non-Medical): No  Physical Activity: Not on file  Stress: No Stress Concern Present (07/17/2022)   Received from [facility] Occupational Health - Occupational Stress Questionnaire    Feeling of Stress : Not at all  Social Connections: Unknown (07/16/2022)   Received from [facility]   Social Network    Social Network: Not on file   Current Medications: Current Facility-Administered Medications  Medication Dose Route Frequency Provider Last Rate Last Admin   acetaminophen (TYLENOL) tablet 650 mg  650 mg Oral Q6H PRN [name], NP   650 mg at 01/19/23 1855   alum & mag hydroxide-simeth (MAALOX/MYLANTA) 200-200-20 MG/5ML suspension 30 mL  30 mL Oral Q4H PRN [name], NP       busPIRone (BUSPAR) tablet 15 mg  15 mg Oral BID [name], NP   15 mg at 01/24/23 1639   cyanocobalamin (VITAMIN B12) injection 1,000 mcg  1,000 mcg Intramuscular Weekly  Attiah, Nadir, MD   1,000 mcg at 01/19/23 1244   Followed by   Melene Muller ON 02/02/2023] cyanocobalamin (VITAMIN B12) injection 1,000 mcg  1,000 mcg Intramuscular Q30 days [name], Nadir, MD       diphenhydrAMINE (BENADRYL) capsule 50 mg  50 mg Oral TID PRN [name], NP   50 mg at 01/15/23 1211   Or   diphenhydrAMINE (BENADRYL) injection 50 mg  50 mg Intramuscular TID PRN [name], NP       feeding supplement (ENSURE ENLIVE / ENSURE PLUS) liquid 237 mL  237 mL Oral TID BM Kenitha Glendinning, NP   237 mL at 01/25/23 1349   haloperidol (HALDOL) tablet 5 mg  5 mg Oral TID PRN [name], NP   5 mg at 01/15/23 1211   Or   haloperidol lactate (HALDOL) injection 5 mg  5 mg Intramuscular TID PRN Sanjuana Kava, NP  hydrOXYzine (ATARAX) tablet 25 mg  25 mg Oral TID PRN [name], DO   25 mg at 01/24/23 2043   LORazepam (ATIVAN) tablet 2 mg  2 mg Oral TID PRN [name], NP   2 mg at 01/15/23 1211   Or   LORazepam (ATIVAN) injection 2 mg  2 mg Intramuscular TID PRN [name], NP       magnesium hydroxide (MILK OF MAGNESIA) suspension 30 mL  30 mL Oral Daily PRN [name], NP       melatonin tablet 5 mg  5 mg Oral QHS Kalinda Romaniello, NP   5 mg at 01/24/23 2043   nicotine (NICODERM CQ - dosed in mg/24 hours) patch 21 mg  21 mg Transdermal Daily [name], DO   21 mg at 01/24/23 0813   paliperidone (INVEGA) 24 hr tablet 6 mg  6 mg Oral Daily [name], NP   6 mg at 01/24/23 2044   pantoprazole (PROTONIX) EC tablet 40 mg  40 mg Oral Daily [name], NP   40 mg at 01/24/23 0814   polyethylene glycol (MIRALAX / GLYCOLAX) packet 17 g  17 g Oral BID [name], DO   17 g at 01/24/23 1639   [START ON 01/26/2023] sertraline (ZOLOFT) tablet 150 mg  150 mg Oral Daily Massengill, Nathan, MD       traZODone (DESYREL) tablet 50 mg  50 mg Oral QHS PRN Cecilie Lowers, FNP   50 mg at 01/24/23 2043   Lab Results:  No results found for this or any previous visit (from the past 48  hour(s)).  Blood Alcohol level:  Lab Results  Component Value Date   ETH <10 12/19/2022   ETH <10 11/21/2019   Metabolic Disorder Labs: Lab Results  Component Value Date   HGBA1C 4.4 (L) 12/21/2022   MPG 79.58 12/21/2022   No results found for: "PROLACTIN" Lab Results  Component Value Date   CHOL 218 (H) 12/21/2022   TRIG 81 12/21/2022   HDL 53 12/21/2022   CHOLHDL 4.1 12/21/2022   VLDL 16 12/21/2022   LDLCALC 149 (H) 12/21/2022   LDLCALC 110 (H) 03/13/2011   Physical Findings: AIMS: Facial and Oral Movements Muscles of Facial Expression: None, normal Lips and Perioral Area: None, normal Jaw: None, normal Tongue: None, normal,Extremity Movements Upper (arms, wrists, hands, fingers): None, normal Lower (legs, knees, ankles, toes): None, normal, Trunk Movements Neck, shoulders, hips: None, normal, Overall Severity Severity of abnormal movements (highest score from questions above): None, normal Incapacitation due to abnormal movements: None, normal Patient's awareness of abnormal movements (rate only patient's report): No Awareness, Dental Status Current problems with teeth and/or dentures?: No Does patient usually wear dentures?: No   Musculoskeletal: Strength & Muscle Tone: within normal limits Gait & Station: normal Patient leans: N/A  Psychiatric Specialty Exam:  Presentation  General Appearance:  Lying down in bed, better hygiene noted  Eye Contact: Fair Speech: Slow  Speech Volume: Decreased  Handedness: Right  Mood and Affect  Mood: Depressed  Affect: Blunt  Thought Process  Thought Processes: Coherent  Descriptions of Associations:Intact  Orientation:Partial  Thought Content: Denies SI HI or AVH does not present responding to stimuli. Presentation consistent with moderate intellectual disability  History of Schizophrenia/Schizoaffective disorder:Yes  Duration of Psychotic Symptoms:Greater than six  months  Hallucinations:Hallucinations: None Description of Auditory Hallucinations: Denies today  Ideas of Reference:None  Suicidal Thoughts:Suicidal Thoughts: No SI Active Intent and/or Plan: -- (denies) SI Passive Intent and/or  Plan: -- (Denies)  Homicidal Thoughts:Homicidal Thoughts: No  Sensorium  Memory: Immediate Good  Judgment: Limited Insight: Limited Executive Functions  Concentration: Poor  Attention Span: Fair  Recall: Poor  Fund of Knowledge: Poor  Language: Fair  Psychomotor Activity  Psychomotor Activity: Psychomotor Activity: Normal  Assets  Assets: Resilience  Sleep  Sleep: Sleep: Good Number of Hours of Sleep: 8  Physical Exam: Physical Exam Vitals and nursing note reviewed.  Constitutional:      General: She is not in acute distress.    Appearance: She is obese. She is not ill-appearing or diaphoretic.  HENT:     Head: Normocephalic.     Comments: Intellectual disability    Nose: No congestion.     Mouth/Throat:     Pharynx: Oropharynx is clear.  Eyes:     Pupils: Pupils are equal, round, and reactive to light.  Cardiovascular:     Rate and Rhythm: Normal rate.     Pulses: Normal pulses.  Pulmonary:     Effort: Pulmonary effort is normal. No respiratory distress.  Abdominal:     Comments: Deferred  Genitourinary:    Comments: Deferred Musculoskeletal:        General: Normal range of motion.     Cervical back: Normal range of motion.  Skin:    General: Skin is warm and dry.  Neurological:     General: No focal deficit present.     Mental Status: She is alert.  Psychiatric:     Comments: Mood is labile and sad.    Blood pressure 113/72, pulse 94, temperature 98 F (36.7 C), temperature source Oral, resp. rate 18, height 4\' 11"  (1.499 m), weight 63 kg, SpO2 100%. Body mass index is 28.05 kg/m.  Treatment Plan Summary: Daily contact with patient to assess and evaluate symptoms and progress in treatment and  Medication management.   Still waiting on safe disposition as patient would not be able to live independently given cognitive impairment. Recommend assisted living.   No medication side effects.  Because of urinary incontinence, recommended adult diapers to be worn.  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  Plan:  -Increase Sertraline from 100 mg to 150 mg po Q daily for depression/anxiety -Continue Buspar 15 mg po bid for anxiety.  -Continue paliperidone 6 mg po qd for mood stabilization -Continue Trazodone 50 mg po prn for insomnia -Continue Nicoderm 21 mg topically Q 24 hrs for nicotine withdrawal management. -Continue vitamin B12 1000 mcg IM weekly for 4 weeks then monthly -Continue Melatonin 5 mg nightly for sleep -Continue Protonix EC 40 mg p.o. daily for GERD -Continue MiraLAX 17 g p.o. twice daily for constipation -Continue hydroxyzine 25 mg p.o. 3 times daily as needed for anxiety -Continue Ensure nutritional shakes TID in between meals  -Previously discontinued Hydroxyzine 50 mg nightly-hypotension in the mornings  Lab work and EKG, reviewed with no significant abnormalities noted, except slightly low b12 166.  01/05/2023, started on B12 1000 mcg injections q. weekly x 4 weeks, then q. monthly.  Safety and Monitoring: Voluntary admission to inpatient psychiatric unit for safety, stabilization and treatment Daily contact with patient to assess and evaluate symptoms and progress in treatment Patient's case to be discussed in multi-disciplinary team meeting Observation Level : q15 minute checks Vital signs: q12 hours  Precautions: Safety   Discharge Planning: Social work and case management to assist with  discharge planning and identification of hospital follow-up needs prior to discharge Estimated LOS: Unknown at this time. Discharge Concerns: Need to establish a safety plan; Medication compliance and effectiveness Discharge Goals: Return home with outpatient referrals for mental health follow-up including medication management/psychotherapy No legal guardian, has payee  [name], NP. 01/25/2023, 1:50 PM Patient ID: Yolanda [name], female   DOB: January 23, 1976, 47 y.o.    MRN: [id number]

## 2023-01-25 NOTE — Progress Notes (Addendum)
Pt refusing to get up for breakfast, drank half a cup of gatorade with this writer present, encouragement given, still refused to go to breakfast.

## 2023-01-25 NOTE — Plan of Care (Signed)

## 2023-01-26 DIAGNOSIS — F251 Schizoaffective disorder, depressive type: Secondary | ICD-10-CM | POA: Diagnosis not present

## 2023-01-26 MED ORDER — TRAZODONE HCL 50 MG PO TABS
50.0000 mg | ORAL_TABLET | Freq: Every day | ORAL | Status: DC
  Administered 2023-01-26 – 2023-08-08 (×194): 50 mg via ORAL
  Filled 2023-01-26 (×132): qty 1
  Filled 2023-01-26: qty 7
  Filled 2023-01-26 (×74): qty 1

## 2023-01-26 NOTE — Plan of Care (Signed)

## 2023-01-26 NOTE — Progress Notes (Signed)
Adult Psychoeducational Group Note  Date:  01/26/2023 Time:  3:45 AM  Group Topic/Focus:  Wrap-Up Group:   The focus of this group is to help patients review their daily goal of treatment and discuss progress on daily workbooks.  Participation Level:  Active  Participation Quality:  Appropriate  Affect:  Appropriate  Cognitive:  Appropriate  Insight: Appropriate  Engagement in Group:  Engaged  Modes of Intervention:  Discussion  Additional Comments:  Pt stated herr goal for the day was to go to group. Pt met goal.  Lucilla Lame 01/26/2023, 3:45 AM

## 2023-01-26 NOTE — Progress Notes (Signed)
Patient ID: Yolanda [name], female   DOB: August 07, 1975, 47 y.o.   MRN: [id number] [facility] MD Progress Note  01/26/2023 1:55 PM Cate RAHMAH MCCAMY  MRN:  [id number]  Reason for admission: This is the first psychiatric admission in this [facility] in 12 years for this 59 AA female with an extensive hx of mental illnesses & probable polysubstance use disorders. She is admitted to the [facility] from the [facility] hospital with complain of worsening suicidal ideations with plan to stab herself. Per chart review, patient apparently reported at the ED that she has been depressed for a while & has not been taking her mental health medications. After medical evaluation.clearance, she was transferred to the [facility] for further psychiatric evaluation/treatments.   APS report on 6/24 Has payee, no legal guardian Continues to await placement.  24 hr chart review: Sleep Hours last night: Not documented, pt reports a poor sleep quality Nursing Concerns: Isolative to room Behavioral episodes in the past 24 hrs: As above Medication Compliance: ;.compliant Vital Signs in the past 24 hrs: V/S initially significantly low earlier today morning, but rechecked at 1107, and WNL.  PRN Medications in the past 24 hrs: None  Patient assessment note: Patient is tearful through out encounter, reports being very depressed due to feeling like she has been abandoned by her family, reports that, they do not want any thing to do with her, states multiple times "I just want to go home". Pt educated that her, treatment team is collaborating with DSS to find her a safe place to call "home". Mood remains depressed, affect is congruent and blunted.  She denies SI/HI/AVH, presents with paranoia today, states that nonspecific people outside of the hospital are out to harm her. She presents with thought broadcasting, states that other people know what she is thinking, and this has rendered her to not go to the day room. Pt educated that other people are  unable to tell what she is thinking, and that she needs to be getting out of her room and attending unit group meetings.  Staff also educated to ensure that the patient get out of her room, and to lock the patient's room during group activities and meals so that she can participate in both.  Patient reports a poor quality of sleep last night, reports that she is eating well, continues to appear disheveled, the need to tend to personal hygiene and grooming reiterated.  We will change trazodone to a scheduled medication instead of a PRN med due to complaints of insomnia, since medication was not administered last night and patient is complaining of insomnia.  We are continuing other medications as listed below.  Patient denies medication related side effects, reports last bowel movement as being yesterday.  She denies being in any physical pain today.  We are continuing to await placement.  Lack of housing continues to be the main stressor of patient's, and a significant contributor to her depressive symptoms.  Principal Problem: Schizoaffective disorder, depressive type (HCC)   Diagnosis: Principal Problem:   Schizoaffective disorder, depressive type (HCC) Active Problems:   GERD (gastroesophageal reflux disease)   Intellectual disability   Tobacco use disorder  Past Psychiatric History: Patient is not forth coming with the answers to the assessment questions. She is refusing to provide information about her mental health hx or symptoms.   Past Medical History:  Past Medical History:  Diagnosis Date   Bipolar affect, depressed (HCC)    Constipation 08/17/2022   Depression  Falls 07/21/2022   Fracture of femoral neck, right, closed (HCC) 01/17/2022   Herpes simplex 08/22/2017   Open fracture dislocation of right elbow joint 01/17/2022    Past Surgical History:  Procedure Laterality Date   NO PAST SURGERIES     SALPINGECTOMY     Family History: History reviewed. No pertinent family  history.  Family Psychiatric  History: See H&P.  Social History:  Social History   Substance and Sexual Activity  Alcohol Use Yes     Social History   Substance and Sexual Activity  Drug Use Yes   Types: Cocaine, Marijuana    Social History   Socioeconomic History   Marital status: Single    Spouse name: Not on file   Number of children: Not on file   Years of education: Not on file   Highest education level: Not on file  Occupational History   Not on file  Tobacco Use   Smoking status: Every Day   Smokeless tobacco: Not on file  Substance and Sexual Activity   Alcohol use: Yes   Drug use: Yes    Types: Cocaine, Marijuana   Sexual activity: Yes  Other Topics Concern   Not on file  Social History Narrative   Not on file   Social Determinants of Health   Financial Resource Strain: Low Risk  (09/12/2022)   Received from [facility]   Overall Financial Resource Strain (CARDIA)    Difficulty of Paying Living Expenses: Not hard at all  Food Insecurity: Patient Declined (12/20/2022)   Hunger Vital Sign    Worried About Running Out of Food in the Last Year: Patient declined    Ran Out of Food in the Last Year: Patient declined  Transportation Needs: No Transportation Needs (12/20/2022)   PRAPARE - Administrator, Civil Service (Medical): No    Lack of Transportation (Non-Medical): No  Physical Activity: Not on file  Stress: No Stress Concern Present (07/17/2022)   Received from [facility] - Occupational Stress Questionnaire    Feeling of Stress : Not at all  Social Connections: Unknown (07/16/2022)   Received from [facility]   Social Network    Social Network: Not on file   Current Medications: Current Facility-Administered Medications  Medication Dose Route Frequency Provider Last Rate Last Admin   acetaminophen (TYLENOL) tablet 650 mg  650 mg Oral Q6H PRN [name], NP   650 mg at 01/19/23 1855    alum & mag hydroxide-simeth (MAALOX/MYLANTA) 200-200-20 MG/5ML suspension 30 mL  30 mL Oral Q4H PRN [name], NP       busPIRone (BUSPAR) tablet 15 mg  15 mg Oral BID [name], NP   15 mg at 01/26/23 1324   cyanocobalamin (VITAMIN B12) injection 1,000 mcg  1,000 mcg Intramuscular Weekly Attiah, Nadir, MD   1,000 mcg at 01/19/23 1244   Followed by   Melene Muller ON 02/02/2023] cyanocobalamin (VITAMIN B12) injection 1,000 mcg  1,000 mcg Intramuscular Q30 days [name], Nadir, MD       diphenhydrAMINE (BENADRYL) capsule 50 mg  50 mg Oral TID PRN [name], NP   50 mg at 01/15/23 1211   Or   diphenhydrAMINE (BENADRYL) injection 50 mg  50 mg Intramuscular TID PRN [name], NP       feeding supplement (ENSURE ENLIVE / ENSURE PLUS) liquid 237 mL  237 mL Oral TID BM Maalle Starrett, NP   237 mL at  01/26/23 1324   haloperidol (HALDOL) tablet 5 mg  5 mg Oral TID PRN [name], NP   5 mg at 01/15/23 1211   Or   haloperidol lactate (HALDOL) injection 5 mg  5 mg Intramuscular TID PRN [name], NP       hydrOXYzine (ATARAX) tablet 25 mg  25 mg Oral TID PRN [name], DO   25 mg at 01/24/23 2043   LORazepam (ATIVAN) tablet 2 mg  2 mg Oral TID PRN [name], NP   2 mg at 01/15/23 1211   Or   LORazepam (ATIVAN) injection 2 mg  2 mg Intramuscular TID PRN [name], NP       magnesium hydroxide (MILK OF MAGNESIA) suspension 30 mL  30 mL Oral Daily PRN [name], NP       melatonin tablet 5 mg  5 mg Oral QHS Dillin Lofgren, NP   5 mg at 01/25/23 2150   nicotine (NICODERM CQ - dosed in mg/24 hours) patch 21 mg  21 mg Transdermal Daily [name], DO   21 mg at 01/24/23 0813   paliperidone (INVEGA) 24 hr tablet 6 mg  6 mg Oral Daily [name], NP   6 mg at 01/25/23 2151   pantoprazole (PROTONIX) EC tablet 40 mg  40 mg Oral Daily [name], NP   40 mg at 01/26/23 0807   polyethylene glycol (MIRALAX / GLYCOLAX) packet 17 g  17 g Oral BID [name], DO   17 g at  01/26/23 8416   sertraline (ZOLOFT) tablet 150 mg  150 mg Oral Daily Massengill, Harrold Donath, MD   150 mg at 01/26/23 6063   traZODone (DESYREL) tablet 50 mg  50 mg Oral QHS [name], NP       Lab Results:  No results found for this or any previous visit (from the past 48 hour(s)).  Blood Alcohol level:  Lab Results  Component Value Date   ETH <10 12/19/2022   ETH <10 11/21/2019   Metabolic Disorder Labs: Lab Results  Component Value Date   HGBA1C 4.4 (L) 12/21/2022   MPG 79.58 12/21/2022   No results found for: "PROLACTIN" Lab Results  Component Value Date   CHOL 218 (H) 12/21/2022   TRIG 81 12/21/2022   HDL 53 12/21/2022   CHOLHDL 4.1 12/21/2022   VLDL 16 12/21/2022   LDLCALC 149 (H) 12/21/2022   LDLCALC 110 (H) 03/13/2011   Physical Findings: AIMS: Facial and Oral Movements Muscles of Facial Expression: None, normal Lips and Perioral Area: None, normal Jaw: None, normal Tongue: None, normal,Extremity Movements Upper (arms, wrists, hands, fingers): None, normal Lower (legs, knees, ankles, toes): None, normal, Trunk Movements Neck, shoulders, hips: None, normal, Overall Severity Severity of abnormal movements (highest score from questions above): None, normal Incapacitation due to abnormal movements: None, normal Patient's awareness of abnormal movements (rate only patient's report): No Awareness, Dental Status Current problems with teeth and/or dentures?: No Does patient usually wear dentures?: No   Musculoskeletal: Strength & Muscle Tone: within normal limits Gait & Station: normal Patient leans: N/A  Psychiatric Specialty Exam:  Presentation  General Appearance:  Lying down in bed, better hygiene noted  Eye Contact: Fair Speech: Slow  Speech Volume: Decreased  Handedness: Right  Mood and Affect  Mood: Depressed  Affect: Congruent  Thought Process  Thought Processes: Linear  Descriptions of  Associations:Intact  Orientation:Partial  Thought Content: Denies SI HI or AVH does not present responding to stimuli.  Presentation consistent with moderate intellectual disability  History of Schizophrenia/Schizoaffective disorder:Yes  Duration of Psychotic Symptoms:Greater than six months  Hallucinations:Hallucinations: None  Ideas of Reference:Paranoia; Percusatory  Suicidal Thoughts:Suicidal Thoughts: No  Homicidal Thoughts:Homicidal Thoughts: No  Sensorium  Memory: Immediate Good  Judgment: Limited Insight: Limited Executive Functions  Concentration: Fair  Attention Span: Fair  Recall: Poor  Fund of Knowledge: Poor  Language: Fair  Psychomotor Activity  Psychomotor Activity: Psychomotor Activity: Normal  Assets  Assets: Communication Skills; Resilience  Sleep  Sleep: Sleep: Poor  Physical Exam: Physical Exam Vitals and nursing note reviewed.  Constitutional:      General: She is not in acute distress.    Appearance: She is obese. She is not ill-appearing or diaphoretic.  HENT:     Head: Normocephalic.     Comments: Intellectual disability    Nose: No congestion.     Mouth/Throat:     Pharynx: Oropharynx is clear.  Eyes:     Pupils: Pupils are equal, round, and reactive to light.  Cardiovascular:     Rate and Rhythm: Normal rate.     Pulses: Normal pulses.  Pulmonary:     Effort: Pulmonary effort is normal. No respiratory distress.  Abdominal:     Comments: Deferred  Genitourinary:    Comments: Deferred Musculoskeletal:        General: Normal range of motion.     Cervical back: Normal range of motion.  Skin:    General: Skin is warm and dry.  Neurological:     General: No focal deficit present.     Mental Status: She is alert.  Psychiatric:     Comments: Mood is labile and sad.    Blood pressure 94/71, pulse 76, temperature 98 F (36.7 C), temperature source Oral, resp. rate 16, height 4\' 11"  (1.499 m), weight 63 kg, SpO2  100%. Body mass index is 28.05 kg/m.  Treatment Plan Summary: Daily contact with patient to assess and evaluate symptoms and progress in treatment and Medication management.   Still waiting on safe disposition as patient would not be able to live independently given cognitive impairment. Recommend assisted living.   No medication side effects.  Because of urinary incontinence, recommended adult diapers to be worn.  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  Plan:  -Continue Sertraline from 100 mg to 150 mg po Q daily for depression/anxiety -Continue Buspar 15 mg po bid for anxiety.  -Continue paliperidone 6 mg po qd for mood stabilization -Continue Trazodone 50 mg for insomnia & change from PRN to scheduled -Continue Nicoderm 21 mg topically Q 24 hrs for nicotine withdrawal management. -Continue vitamin B12 1000 mcg IM weekly for 4 weeks then monthly -Continue Melatonin 5 mg nightly for sleep -Continue Protonix EC 40 mg p.o. daily for GERD -Continue MiraLAX 17 g p.o. twice daily for constipation -Continue hydroxyzine 25 mg p.o. 3 times daily as needed for anxiety -Continue Ensure nutritional shakes TID in between meals  -Previously discontinued Hydroxyzine 50 mg nightly-hypotension in the mornings  Lab work and EKG, reviewed with no significant abnormalities noted, except slightly low b12 166.  01/05/2023, started on B12 1000 mcg injections q. weekly x 4 weeks, then q. monthly.  Safety and Monitoring: Voluntary admission to inpatient psychiatric unit for safety, stabilization and treatment Daily contact with  patient to assess and evaluate symptoms and progress in treatment Patient's case to be discussed in  multi-disciplinary team meeting Observation Level : q15 minute checks Vital signs: q12 hours Precautions: Safety   Discharge Planning: Social work and case management to assist with discharge planning and identification of hospital follow-up needs prior to discharge Estimated LOS: Unknown at this time. Discharge Concerns: Need to establish a safety plan; Medication compliance and effectiveness Discharge Goals: Return home with outpatient referrals for mental health follow-up including medication management/psychotherapy No legal guardian, has payee  [name], NP. 01/26/2023, 1:55 PM Patient ID: Yolanda [name], female   DOB: 02/23/1976, 47 y.o.    MRN: [id number]

## 2023-01-26 NOTE — [facility] Group Notes (Signed)
[facility] Group Notes:  (Nursing/MHT/Case Management/Adjunct)  Date:  01/26/2023  Time:  9:19 PM  Type of Therapy:   Wrap-up group  Participation Level:  Active  Participation Quality:  Appropriate  Affect:  Appropriate  Cognitive:  Appropriate  Insight:  Appropriate  Engagement in Group:  Engaged  Modes of Intervention:  Education  Summary of Progress/Problems: Goal to attend groups. Pt reports she attended four groups today. Day 6/10.  Noah Delaine 01/26/2023, 9:19 PM

## 2023-01-26 NOTE — Group Note (Signed)
LCSW Group Therapy Note  01/26/2023    10:00-11:00am   Type of Therapy and Topic:  Early Messages Received About Anger  Participation Level:  Active   Description of Group:   In this group, patients shared and discussed the early messages received in their lives about anger through parental or other adult modeling, teaching, repression, punishment, violence, and more.  Participants identified how those lessons influence how they often react when angered.  The group discussed that anger is a secondary emotion caused by other feelings such as fear, judgment, shame, or embarrassment.  Some of the [employer] were discussed and it was recommended that they share the handout with their family or other important adults and discuss during a calm time how using these could help them communicate better and have improved outcomes.  The rules shared included "I" statements, taking a break, one person speaking at a time, not cursing or calling names, and only one topic being discussed at a time.  Therapeutic Goals: Patients will identify one or more childhood message about anger that they received and how it was taught to them. Patients will discuss how these childhood experiences have influenced and continue to influence their own expression or repression of anger even today. Patients will explore possible primary emotions that tend to fuel their secondary emotion of anger. Patients will learn that anger itself is normal and cannot be eliminated, and that healthier coping skills can assist with resolving conflict rather than worsening situations.  Summary of Patient Progress:  The patient shared that her childhood lessons about anger were that she could not show her anger.  She was really happy until she started elementary school, which was when people started talking about her.  She did not know how to show her anger.  As a result, she has often kept feelings inside.   The patient was open to the [employer] explained in group.  The patient participated fully and demonstrated insight.  Therapeutic Modalities:   Cognitive Behavioral Therapy Motivation Interviewing  Lynnell Chad, [place] 01/26/2023 4:09 PM

## 2023-01-26 NOTE — Progress Notes (Signed)
   01/26/23 1700  Psych Admission Type (Psych Patients Only)  Admission Status Voluntary  Psychosocial Assessment  Patient Complaints Sadness  Eye Contact Brief  Facial Expression Sad  Affect Sad  Speech Logical/coherent  Interaction Childlike  Motor Activity Slow  Appearance/Hygiene Disheveled  Behavior Characteristics Cooperative;Appropriate to situation  Mood Sad  Aggressive Behavior  Effect No apparent injury  Thought Process  Coherency WDL  Content WDL  Delusions WDL  Perception WDL  Hallucination None reported or observed  Judgment Poor  Confusion None  Danger to Self  Current suicidal ideation? Denies  Danger to Others  Danger to Others None reported or observed

## 2023-01-27 DIAGNOSIS — F251 Schizoaffective disorder, depressive type: Secondary | ICD-10-CM | POA: Diagnosis not present

## 2023-01-27 NOTE — [facility] Group Notes (Signed)
[facility] Group Notes:  (Nursing/MHT/Case Management/Adjunct)  Date:  01/27/2023  Time:  1:12 PM  Type of Therapy:  Psychoeducational Skills  Participation Level:  did not attend.  Participation Quality:    Affect:    Cognitive:    Insight:    Engagement in Group:  Engaged  Modes of Intervention:  Discussion, Education, and Exploration  Summary of Progress/Problems: Patient attended group in which a podcast was played by Berniece Pap from [employer] podcast discussing mental health and wellness. Tips and tricks on identifying how to support mental wellbeing. Pt did not attend. Yolanda [name] 01/27/2023, 1:12 PM

## 2023-01-27 NOTE — Progress Notes (Signed)
Patient ID: Yolanda [name], female   DOB: 1976/05/27, 47 y.o.   MRN: [id number] [facility] MD Progress Note  01/27/2023 12:29 PM Yolanda [name]  MRN:  [id number]  Reason for admission: This is the first psychiatric admission in this [facility] in 12 years for this 14 AA female with an extensive hx of mental illnesses & probable polysubstance use disorders. She is admitted to the [facility] from the [facility] hospital with complain of worsening suicidal ideations with plan to stab herself. Per chart review, patient apparently reported at the ED that she has been depressed for a while & has not been taking her mental health medications. After medical evaluation.clearance, she was transferred to the [facility] for further psychiatric evaluation/treatments.   APS report on 6/24 Has payee, no legal guardian Continues to await placement.  24 hr chart review: Sleep Hours last night: Good as per patient Nursing Concerns: Isolative to room Behavioral episodes in the past 24 hrs: As above Medication Compliance:compliant Vital Signs in the past 24 hrs: Last vitals with low BP and slightly elevated HR. Nursing asked to recheck PRN Medications in the past 24 hrs: None  Patient assessment note: Patient presents with a depressed mood, denies SI/HI/AVH, denies paranoia today, observed to be very sleepy earlier today morning, but more alert as the afternoon ,approached. Pt continuing to report being very depressed, mostly due to feeling abandoned by her family members, repeatedly stating that she wants to go home, has a difficult time comprehending that placement is being sought for her.  She reports that sleep last night was better than the night prior, reports a good appetite, denies being in physical pain today, denies any other concerns. Room very malodorous today morning, patient has been refusing to eat and continued briefs, they need to tend to personal hygiene and grooming encouraged, nursing made aware to continue to provide  positive reinforcements for patient to attend to her personal hygiene needs.  Patient's treatment team is continuing to await DSS coordination with CSW for placement of patient in a safe environment outside of the hospital.  Continues hospitalization remains necessary at this time while we await/placement in the community.  Continuing medications at this time as below with no changes today.  Principal Problem: Schizoaffective disorder, depressive type (HCC)   Diagnosis: Principal Problem:   Schizoaffective disorder, depressive type (HCC) Active Problems:   GERD (gastroesophageal reflux disease)   Intellectual disability   Tobacco use disorder  Past Psychiatric History: Patient is not forth coming with the answers to the assessment questions. She is refusing to provide information about her mental health hx or symptoms.   Past Medical History:  Past Medical History:  Diagnosis Date   Bipolar affect, depressed (HCC)    Constipation 08/17/2022   Depression    Falls 07/21/2022   Fracture of femoral neck, right, closed (HCC) 01/17/2022   Herpes simplex 08/22/2017   Open fracture dislocation of right elbow joint 01/17/2022    Past Surgical History:  Procedure Laterality Date   NO PAST SURGERIES     SALPINGECTOMY     Family History: History reviewed. No pertinent family history.  Family Psychiatric  History: See H&P.  Social History:  Social History   Substance and Sexual Activity  Alcohol Use Yes     Social History   Substance and Sexual Activity  Drug Use Yes   Types: Cocaine, Marijuana    Social History   Socioeconomic History   Marital status: Single    Spouse name:  Not on file   Number of children: Not on file   Years of education: Not on file   Highest education level: Not on file  Occupational History   Not on file  Tobacco Use   Smoking status: Every Day   Smokeless tobacco: Not on file  Substance and Sexual Activity   Alcohol use: Yes   Drug use: Yes     Types: Cocaine, Marijuana   Sexual activity: Yes  Other Topics Concern   Not on file  Social History Narrative   Not on file   Social Determinants of Health   Financial Resource Strain: Low Risk  (09/12/2022)   Received from [facility]   Overall Financial Resource Strain (CARDIA)    Difficulty of Paying Living Expenses: Not hard at all  Food Insecurity: Patient Declined (12/20/2022)   Hunger Vital Sign    Worried About Running Out of Food in the Last Year: Patient declined    Ran Out of Food in the Last Year: Patient declined  Transportation Needs: No Transportation Needs (12/20/2022)   PRAPARE - Administrator, Civil Service (Medical): No    Lack of Transportation (Non-Medical): No  Physical Activity: Not on file  Stress: No Stress Concern Present (07/17/2022)   Received from [facility] - Occupational Stress Questionnaire    Feeling of Stress : Not at all  Social Connections: Unknown (07/16/2022)   Received from [facility]   Social Network    Social Network: Not on file   Current Medications: Current Facility-Administered Medications  Medication Dose Route Frequency Provider Last Rate Last Admin   acetaminophen (TYLENOL) tablet 650 mg  650 mg Oral Q6H PRN [name], NP   650 mg at 01/19/23 1855   alum & mag hydroxide-simeth (MAALOX/MYLANTA) 200-200-20 MG/5ML suspension 30 mL  30 mL Oral Q4H PRN [name], NP       busPIRone (BUSPAR) tablet 15 mg  15 mg Oral BID [name], NP   15 mg at 01/27/23 0908   [START ON 02/02/2023] cyanocobalamin (VITAMIN B12) injection 1,000 mcg  1,000 mcg Intramuscular Q30 days [name], Nadir, MD       diphenhydrAMINE (BENADRYL) capsule 50 mg  50 mg Oral TID PRN [name], NP   50 mg at 01/15/23 1211   Or   diphenhydrAMINE (BENADRYL) injection 50 mg  50 mg Intramuscular TID PRN [name], NP       feeding supplement (ENSURE ENLIVE / ENSURE PLUS) liquid 237 mL  237 mL Oral TID  BM Maday Guarino, NP   237 mL at 01/27/23 2952   haloperidol (HALDOL) tablet 5 mg  5 mg Oral TID PRN [name], NP   5 mg at 01/15/23 1211   Or   haloperidol lactate (HALDOL) injection 5 mg  5 mg Intramuscular TID PRN [name], NP       hydrOXYzine (ATARAX) tablet 25 mg  25 mg Oral TID PRN [name], DO   25 mg at 01/24/23 2043   LORazepam (ATIVAN) tablet 2 mg  2 mg Oral TID PRN [name], NP   2 mg at 01/15/23 1211   Or   LORazepam (ATIVAN) injection 2 mg  2 mg Intramuscular TID PRN [name], NP       magnesium hydroxide (MILK OF MAGNESIA) suspension 30 mL  30 mL Oral Daily PRN [name], NP       melatonin tablet 5 mg  5 mg Oral QHS Shantana Christon, NP   5 mg at 01/26/23 2059   nicotine (NICODERM CQ - dosed in mg/24 hours) patch 21 mg  21 mg Transdermal Daily [name], DO   21 mg at 01/24/23 0813   paliperidone (INVEGA) 24 hr tablet 6 mg  6 mg Oral Daily [name], NP   6 mg at 01/26/23 2059   pantoprazole (PROTONIX) EC tablet 40 mg  40 mg Oral Daily [name], NP   40 mg at 01/27/23 0908   polyethylene glycol (MIRALAX / GLYCOLAX) packet 17 g  17 g Oral BID [name], DO   17 g at 01/26/23 1191   sertraline (ZOLOFT) tablet 150 mg  150 mg Oral Daily Massengill, Harrold Donath, MD   150 mg at 01/27/23 0908   traZODone (DESYREL) tablet 50 mg  50 mg Oral QHS [name], NP   50 mg at 01/26/23 2059   Lab Results:  No results found for this or any previous visit (from the past 48 hour(s)).  Blood Alcohol level:  Lab Results  Component Value Date   ETH <10 12/19/2022   ETH <10 11/21/2019   Metabolic Disorder Labs: Lab Results  Component Value Date   HGBA1C 4.4 (L) 12/21/2022   MPG 79.58 12/21/2022   No results found for: "PROLACTIN" Lab Results  Component Value Date   CHOL 218 (H) 12/21/2022   TRIG 81 12/21/2022   HDL 53 12/21/2022   CHOLHDL 4.1 12/21/2022   VLDL 16 12/21/2022   LDLCALC 149 (H) 12/21/2022   LDLCALC 110 (H) 03/13/2011    Physical Findings: AIMS: Facial and Oral Movements Muscles of Facial Expression: None, normal Lips and Perioral Area: None, normal Jaw: None, normal Tongue: None, normal,Extremity Movements Upper (arms, wrists, hands, fingers): None, normal Lower (legs, knees, ankles, toes): None, normal, Trunk Movements Neck, shoulders, hips: None, normal, Overall Severity Severity of abnormal movements (highest score from questions above): None, normal Incapacitation due to abnormal movements: None, normal Patient's awareness of abnormal movements (rate only patient's report): No Awareness, Dental Status Current problems with teeth and/or dentures?: No Does patient usually wear dentures?: No   Musculoskeletal: Strength & Muscle Tone: within normal limits Gait & Station: normal Patient leans: N/A  Psychiatric Specialty Exam:  Presentation  General Appearance:  Lying down in bed, better hygiene noted  Eye Contact: Fair Speech: Slow  Speech Volume: Decreased  Handedness: Right  Mood and Affect  Mood: Depressed  Affect: Congruent  Thought Process  Thought Processes: Coherent  Descriptions of Associations:Intact  Orientation:Partial  Thought Content: Denies SI HI or AVH does not present responding to stimuli. Presentation consistent with moderate intellectual disability  History of Schizophrenia/Schizoaffective disorder:Yes  Duration of Psychotic Symptoms:Greater than six months  Hallucinations:Hallucinations: None  Ideas of Reference:None  Suicidal Thoughts:Suicidal Thoughts: No  Homicidal Thoughts:Homicidal Thoughts: No  Sensorium  Memory: Immediate Good  Judgment: Limited Insight: Limited Executive Functions  Concentration: Fair  Attention Span: Fair  Recall: Fair  Fund of Knowledge: Poor  Language: Fair  Psychomotor Activity  Psychomotor Activity: Psychomotor Activity: Normal  Assets  Assets: Resilience  Sleep  Sleep: Sleep:  Good  Physical Exam: Physical Exam Vitals and nursing note reviewed.  Constitutional:      General: She is not in acute distress.    Appearance: She is obese. She is not ill-appearing or diaphoretic.  HENT:     Head: Normocephalic.     Comments: Intellectual disability    Nose: No congestion.  Mouth/Throat:     Pharynx: Oropharynx is clear.  Eyes:     Pupils: Pupils are equal, round, and reactive to light.  Cardiovascular:     Rate and Rhythm: Normal rate.     Pulses: Normal pulses.  Pulmonary:     Effort: Pulmonary effort is normal. No respiratory distress.  Abdominal:     Comments: Deferred  Genitourinary:    Comments: Deferred Musculoskeletal:        General: Normal range of motion.     Cervical back: Normal range of motion.  Skin:    General: Skin is warm and dry.  Neurological:     General: No focal deficit present.     Mental Status: She is alert.  Psychiatric:     Comments: Mood is labile and sad.    Blood pressure 99/60, pulse (!) 57, temperature 98 F (36.7 C), temperature source Oral, resp. rate 16, height 4\' 11"  (1.499 m), weight 63 kg, SpO2 100%. Body mass index is 28.05 kg/m.  Treatment Plan Summary: Daily contact with patient to assess and evaluate symptoms and progress in treatment and Medication management.   Still waiting on safe disposition as patient would not be able to live independently given cognitive impairment. Recommend assisted living.   No medication side effects.  Because of urinary incontinence, recommended adult diapers to be worn.  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  Plan:  -Continue Sertraline from 100 mg to 150 mg po Q daily for depression/anxiety -Continue  Buspar 15 mg po bid for anxiety.  -Continue paliperidone 6 mg po qd for mood stabilization -Continue Trazodone 50 mg nightly for insomnia  -Continue Nicoderm 21 mg topically Q 24 hrs for nicotine withdrawal management. -Continue vitamin B12 1000 mcg IM weekly for 4 weeks then monthly -Continue Melatonin 5 mg nightly for sleep -Continue Protonix EC 40 mg p.o. daily for GERD -Continue MiraLAX 17 g p.o. twice daily for constipation -Continue hydroxyzine 25 mg p.o. 3 times daily as needed for anxiety -Continue Ensure nutritional shakes TID in between meals  -Previously discontinued Hydroxyzine 50 mg nightly-hypotension in the mornings  Lab work and EKG, reviewed with no significant abnormalities noted, except slightly low b12 166.  01/05/2023, started on B12 1000 mcg injections q. weekly x 4 weeks, then q. monthly.  Safety and Monitoring: Voluntary admission to inpatient psychiatric unit for safety, stabilization and treatment Daily contact with patient to assess and evaluate symptoms and progress in treatment Patient's case to be discussed in multi-disciplinary team meeting Observation Level : q15 minute checks Vital signs: q12 hours Precautions: Safety   Discharge Planning: Social work and case management to assist with discharge planning and identification of hospital follow-up needs prior to discharge Estimated LOS: Unknown at this time. Discharge Concerns: Need to establish a safety plan; Medication compliance and effectiveness Discharge Goals: Return home with outpatient referrals for mental health follow-up including medication management/psychotherapy No legal guardian, has payee  [name], NP. 01/27/2023, 12:29 PM Patient ID: Yolanda [name], female   DOB: [birth year], 47 y.o.    MRN: [id number] Patient ID: Yolanda [name], female   DOB: 13-Dec-1975, 47 y.o.   MRN: [id number]

## 2023-01-27 NOTE — Progress Notes (Signed)
   01/27/23 1200  Psych Admission Type (Psych Patients Only)  Admission Status Voluntary  Psychosocial Assessment  Patient Complaints None  Eye Contact Fair  Facial Expression Sad  Affect Sad  Speech Logical/coherent  Interaction Childlike  Motor Activity Slow  Appearance/Hygiene Disheveled  Behavior Characteristics Cooperative;Appropriate to situation  Mood Sad  Aggressive Behavior  Effect No apparent injury  Thought Process  Coherency WDL  Content WDL  Delusions WDL  Perception WDL  Hallucination None reported or observed  Judgment Poor  Confusion None  Danger to Self  Current suicidal ideation? Denies  Danger to Others  Danger to Others None reported or observed

## 2023-01-27 NOTE — Plan of Care (Signed)
  Problem: Education: Goal: Utilization of techniques to improve thought processes will improve Outcome: Progressing   Problem: Activity: Goal: Interest or engagement in leisure activities will improve Outcome: Progressing   

## 2023-01-27 NOTE — [facility] Group Notes (Addendum)
[facility] Group Notes:  (Nursing/MHT/Case Management/Adjunct)  Date:  01/27/2023  Time:  1:31 PM  Type of Therapy:  Psychoeducational Skills  Participation Level:  DID NOT ATTEND  Participation Quality:    Affect:    Cognitive:    Insight:    Engagement in Group:    Modes of Intervention:  Discussion, Education, and Exploration  Summary of Progress/Problems: Patients were given education on how negative thinking can impact the brain, our mental health. Pt were then asked to practice positive affirmations. Pt DID NOT ATTEND. Malva Limes 01/27/2023, 1:31 PM

## 2023-01-27 NOTE — [facility] Group Notes (Signed)
[facility] Group Notes:  (Nursing/MHT/Case Management/Adjunct)  Date:  01/27/2023  Time:  8:49 PM  Type of Therapy:   Wrap-up group  Participation Level:  Active  Participation Quality:  Appropriate  Affect:  Appropriate  Cognitive:  Appropriate  Insight:  Appropriate  Engagement in Group:  Engaged  Modes of Intervention:  Education  Summary of Progress/Problems: Pt goal to attend group. Pt reports she attended three groups. Day 10/10.  Noah Delaine 01/27/2023, 8:49 PM

## 2023-01-27 NOTE — Plan of Care (Signed)
  Problem: Education: Goal: Utilization of techniques to improve thought processes will improve Outcome: Progressing Goal: Knowledge of the prescribed therapeutic regimen will improve Outcome: Progressing   Problem: Activity: Goal: Interest or engagement in leisure activities will improve Outcome: Progressing Goal: Imbalance in normal sleep/wake cycle will improve Outcome: Progressing   

## 2023-01-27 NOTE — Progress Notes (Signed)
Pt requested to speak to this staff member and that I come to her room.  Pt stated that she reads the Bible and is trying to make amends for past mistakes.  Pt states that she does not wish to live in a group home, would like to live on her own.   Pt expressed fear that someone would be watching her all of the time even when she was sleeping.  Pt states that she has always told her children that they are intelligent and beautiful and to not respond to derogatory comments like those that she received from her mother.  Pt appears to be working towards more independence and positive attitude.  This staff member pointed out the good reports that have been received about Pt showering this weekend and attending groups appropriately.  It had also been reported that Pt had requested "good smelling" shampoo and body wash.  Pt stated that she washed her hair today and it felt better, but that she wishes that there was someone to braid it for her.  Pt complemented on her positive attitude and smile.  Pt encouraged to take one day at a time and to continue to work towards doing all that she can for herself but at her pace.  Pt smiling and verbalized understanding and hope.  Will continue to monitor care plan progress.      01/27/23 2226  Psych Admission Type (Psych Patients Only)  Admission Status Voluntary  Psychosocial Assessment  Patient Complaints None  Eye Contact Fair  Facial Expression Animated  Affect Appropriate to circumstance  Speech Logical/coherent;Soft  Interaction Childlike  Motor Activity Slow  Appearance/Hygiene Disheveled  Behavior Characteristics Appropriate to situation  Mood Depressed;Pleasant  Thought Process  Coherency WDL  Content WDL  Delusions None reported or observed  Perception WDL  Hallucination None reported or observed  Judgment Limited  Confusion None  Danger to Self  Current suicidal ideation? Denies  Self-Injurious Behavior No self-injurious ideation or behavior  indicators observed or expressed   Agreement Not to Harm Self Yes  Description of Agreement verbal  Danger to Others  Danger to Others None reported or observed

## 2023-01-28 ENCOUNTER — Encounter (HOSPITAL_COMMUNITY): Payer: Self-pay

## 2023-01-28 DIAGNOSIS — F251 Schizoaffective disorder, depressive type: Secondary | ICD-10-CM | POA: Diagnosis not present

## 2023-01-28 MED ORDER — POLYETHYLENE GLYCOL 3350 17 G PO PACK
17.0000 g | PACK | Freq: Every day | ORAL | Status: DC | PRN
  Administered 2023-02-10 – 2023-04-13 (×11): 17 g via ORAL
  Filled 2023-01-28 (×10): qty 1

## 2023-01-28 NOTE — Progress Notes (Signed)
   01/28/23 1003  Psych Admission Type (Psych Patients Only)  Admission Status Voluntary  Psychosocial Assessment  Patient Complaints Sleep disturbance  Eye Contact Brief  Facial Expression Flat  Affect Flat  Speech Soft;Argumentative  Interaction Childlike  Motor Activity Slow  Appearance/Hygiene Disheveled  Behavior Characteristics Unwilling to participate  Mood Depressed  Thought Process  Coherency WDL  Content WDL  Delusions None reported or observed  Perception WDL  Hallucination None reported or observed  Judgment Poor  Confusion None  Danger to Self  Current suicidal ideation? Denies  Self-Injurious Behavior No self-injurious ideation or behavior indicators observed or expressed   Agreement Not to Harm Self Yes  Description of Agreement verbal  Danger to Others  Danger to Others None reported or observed

## 2023-01-28 NOTE — BH IP Treatment Plan (Signed)
Interdisciplinary Treatment and Diagnostic Plan Update  01/28/2023 Time of Session: 9:45 AM ( UPDATE)  Yolanda [name] MRN: [id number]  Principal Diagnosis: Schizoaffective disorder, depressive type (HCC)  Secondary Diagnoses: Principal Problem:   Schizoaffective disorder, depressive type (HCC) Active Problems:   GERD (gastroesophageal reflux disease)   Intellectual disability   Tobacco use disorder   Current Medications:  Current Facility-Administered Medications  Medication Dose Route Frequency Provider Last Rate Last Admin   acetaminophen (TYLENOL) tablet 650 mg  650 mg Oral Q6H PRN [name], NP   650 mg at 01/19/23 1855   alum & mag hydroxide-simeth (MAALOX/MYLANTA) 200-200-20 MG/5ML suspension 30 mL  30 mL Oral Q4H PRN [name], NP   30 mL at 01/28/23 1530   busPIRone (BUSPAR) tablet 15 mg  15 mg Oral BID [name], NP   15 mg at 01/28/23 0937   [START ON 02/02/2023] cyanocobalamin (VITAMIN B12) injection 1,000 mcg  1,000 mcg Intramuscular Q30 days Sarita Bottom, MD       diphenhydrAMINE (BENADRYL) capsule 50 mg  50 mg Oral TID PRN [name], NP   50 mg at 01/15/23 1211   Or   diphenhydrAMINE (BENADRYL) injection 50 mg  50 mg Intramuscular TID PRN [name], NP       feeding supplement (ENSURE ENLIVE / ENSURE PLUS) liquid 237 mL  237 mL Oral TID BM Nkwenti, Doris, NP   237 mL at 01/28/23 1531   haloperidol (HALDOL) tablet 5 mg  5 mg Oral TID PRN [name], NP   5 mg at 01/15/23 1211   Or   haloperidol lactate (HALDOL) injection 5 mg  5 mg Intramuscular TID PRN [name], NP       hydrOXYzine (ATARAX) tablet 25 mg  25 mg Oral TID PRN [name], DO   25 mg at 01/24/23 2043   LORazepam (ATIVAN) tablet 2 mg  2 mg Oral TID PRN [name], NP   2 mg at 01/15/23 1211   Or   LORazepam (ATIVAN) injection 2 mg  2 mg Intramuscular TID PRN [name], NP       magnesium hydroxide (MILK OF MAGNESIA) suspension 30 mL  30 mL Oral Daily PRN [name], NP       melatonin tablet 5 mg  5 mg Oral QHS Nkwenti, Doris, NP   5 mg at 01/27/23 2110   nicotine (NICODERM CQ - dosed in mg/24 hours) patch 21 mg  21 mg Transdermal Daily [name], DO   21 mg at 01/28/23 7846   paliperidone (INVEGA) 24 hr tablet 6 mg  6 mg Oral Daily [name], NP   6 mg at 01/27/23 2110   pantoprazole (PROTONIX) EC tablet 40 mg  40 mg Oral Daily [name], NP   40 mg at 01/28/23 9629   polyethylene glycol (MIRALAX / GLYCOLAX) packet 17 g  17 g Oral Daily PRN [name], NP       sertraline (ZOLOFT) tablet 150 mg  150 mg Oral Daily Massengill, Harrold Donath, MD   150 mg at 01/28/23 5284   traZODone (DESYREL) tablet 50 mg  50 mg Oral QHS [name], NP   50 mg at 01/27/23 2110   PTA Medications: Medications Prior to Admission  Medication Sig Dispense Refill Last Dose   busPIRone (BUSPAR) 15 MG tablet Take 15 mg by mouth 2 (two) times daily. (Patient not taking: Reported on 12/19/2022)      paliperidone (INVEGA SUSTENNA) 156  MG/ML SUSY injection Inject 156 mg into the muscle once. (Patient not taking: Reported on 12/19/2022)      sertraline (ZOLOFT) 50 MG tablet Take 150 mg by mouth daily. (Patient not taking: Reported on 12/19/2022)      traZODone (DESYREL) 100 MG tablet Take 100 mg by mouth at bedtime as needed for sleep. (Patient not taking: Reported on 11/12/2022)       Patient Stressors: Medication change or noncompliance    Patient Strengths: Forensic psychologist fund of knowledge   Treatment Modalities: Medication Management, Group therapy, Case management,  1 to 1 session with clinician, Psychoeducation, Recreational therapy.   Physician Treatment Plan for Primary Diagnosis: Schizoaffective disorder, depressive type (HCC) Long Term Goal(s): Improvement in symptoms so as ready for discharge   Short Term Goals: Ability to identify and develop effective coping behaviors will improve Ability to maintain clinical measurements within normal  limits will improve Compliance with prescribed medications will improve Ability to identify triggers associated with substance abuse/mental health issues will improve Ability to identify changes in lifestyle to reduce recurrence of condition will improve Ability to verbalize feelings will improve Ability to disclose and discuss suicidal ideas Ability to demonstrate self-control will improve  Medication Management: Evaluate patient's response, side effects, and tolerance of medication regimen.  Therapeutic Interventions: 1 to 1 sessions, Unit Group sessions and Medication administration.  Evaluation of Outcomes: Progressing  Physician Treatment Plan for Secondary Diagnosis: Principal Problem:   Schizoaffective disorder, depressive type (HCC) Active Problems:   GERD (gastroesophageal reflux disease)   Intellectual disability   Tobacco use disorder  Long Term Goal(s): Improvement in symptoms so as ready for discharge   Short Term Goals: Ability to identify and develop effective coping behaviors will improve Ability to maintain clinical measurements within normal limits will improve Compliance with prescribed medications will improve Ability to identify triggers associated with substance abuse/mental health issues will improve Ability to identify changes in lifestyle to reduce recurrence of condition will improve Ability to verbalize feelings will improve Ability to disclose and discuss suicidal ideas Ability to demonstrate self-control will improve     Medication Management: Evaluate patient's response, side effects, and tolerance of medication regimen.  Therapeutic Interventions: 1 to 1 sessions, Unit Group sessions and Medication administration.  Evaluation of Outcomes: Progressing   RN Treatment Plan for Primary Diagnosis: Schizoaffective disorder, depressive type (HCC) Long Term Goal(s): Knowledge of disease and therapeutic regimen to maintain health will improve  Short  Term Goals: Ability to remain free from injury will improve, Ability to verbalize frustration and anger appropriately will improve, Ability to participate in decision making will improve, Ability to verbalize feelings will improve, Ability to identify and develop effective coping behaviors will improve, and Compliance with prescribed medications will improve  Medication Management: RN will administer medications as ordered by provider, will assess and evaluate patient's response and provide education to patient for prescribed medication. RN will report any adverse and/or side effects to prescribing provider.  Therapeutic Interventions: 1 on 1 counseling sessions, Psychoeducation, Medication administration, Evaluate responses to treatment, Monitor vital signs and CBGs as ordered, Perform/monitor CIWA, COWS, AIMS and Fall Risk screenings as ordered, Perform wound care treatments as ordered.  Evaluation of Outcomes: Progressing   LCSW Treatment Plan for Primary Diagnosis: Schizoaffective disorder, depressive type (HCC) Long Term Goal(s): Safe transition to appropriate next level of care at discharge, Engage patient in therapeutic group addressing interpersonal concerns.  Short Term Goals: Engage patient in aftercare planning with referrals and resources,  Increase social support, Increase emotional regulation, Facilitate acceptance of mental health diagnosis and concerns, Identify triggers associated with mental health/substance abuse issues, and Increase skills for wellness and recovery  Therapeutic Interventions: Assess for all discharge needs, 1 to 1 time with Social worker, Explore available resources and support systems, Assess for adequacy in community support network, Educate family and significant other(s) on suicide prevention, Complete Psychosocial Assessment, Interpersonal group therapy.  Evaluation of Outcomes: Progressing   Progress in Treatment: Attending groups: Yes. Participating in  groups: Yes. Taking medication as prescribed: Yes. Toleration medication: Yes. Family/Significant other contact made: Yes, individual(s) contacted:  Mother Patient understands diagnosis: Yes. Discussing patient identified problems/goals with staff: Yes. Medical problems stabilized or resolved: Yes. Denies suicidal/homicidal ideation: Yes. Issues/concerns per patient self-inventory: No.    New problem(s) identified: No, Describe:  None reported   New Short Term/Long Term Goal(s): medication stabilization, elimination of SI thoughts, development of comprehensive mental wellness plan.    Patient Goals:  Coping Skills   Discharge Plan or Barriers: Patient is awaiting appropriate placement CSW will continue to follow and assess for appropriate referrals and possible discharge planning.    Reason for Continuation of Hospitalization: Anxiety Depression Hallucinations Medication stabilization Suicidal ideation   Estimated Length of Stay: Patient is awaiting for a group home    Last 3 [place] Suicide Severity Risk Score: Flowsheet Row Admission (Current) [facility] from 12/19/2022 in [facility] at [facility] ED from 11/12/2022 in [facility] Emergency Department at [facility]  C-SSRS RISK CATEGORY High Risk High Risk Moderate Risk       Last [facility] 2/9 Scores:     No data to display          Scribe for Treatment Team: Beather Arbour 01/28/2023 3:52 PM

## 2023-01-28 NOTE — Group Note (Signed)
Occupational Therapy Group Note  Group Topic: Sleep Hygiene  Group Date: 01/28/2023 Start Time: 1430 End Time: 1500 Facilitators: Ted Mcalpine, OT   Group Description: Group encouraged increased participation and engagement through topic focused on sleep hygiene. Patients reflected on the quality of sleep they typically receive and identified areas that need improvement. Group was given background information on sleep and sleep hygiene, including common sleep disorders. Group members also received information on how to improve one's sleep and introduced a sleep diary as a tool that can be utilized to track sleep quality over a length of time. Group session ended with patients identifying one or more strategies they could utilize or implement into their sleep routine in order to improve overall sleep quality.        Therapeutic Goal(s):  Identify one or more strategies to improve overall sleep hygiene  Identify one or more areas of sleep that are negatively impacted (sleep too much, too little, etc)     Participation Level: Minimal   Participation Quality: Independent   Behavior: Appropriate   Speech/Thought Process: Loose association    Affect/Mood: Appropriate   Insight: Limited   Judgement: Limited      Modes of Intervention: Education  Patient Response to Interventions:  Attentive   Plan: Continue to engage patient in OT groups 2 - 3x/week.  01/28/2023  Ted Mcalpine, OT   Kerrin Champagne, OT

## 2023-01-28 NOTE — [facility] Group Notes (Signed)
[facility] Group Notes:  (Nursing/MHT/Case Management/Adjunct)  Date:  01/28/2023  Time:  2000  Type of Therapy:  Alcoholics Anonymous Meeting  Participation Level:  Active  Participation Quality:  Appropriate and Attentive  Affect:  Depressed  Cognitive:  Lacking  Insight:  Lacking  Engagement in Group:  Improving  Modes of Intervention:  Education and Support  Summary of Progress/Problems:  [name] 01/28/2023, 9:30 PM

## 2023-01-28 NOTE — Group Note (Signed)
Date:  01/28/2023 Time:  10:57 AM  Group Topic/Focus:  Emotional Education:   The focus of this group is to discuss what feelings/emotions are, and how they are experienced.    Participation Level:  Active  Participation Quality:  Appropriate  Affect:  Appropriate  Cognitive:  Appropriate  Insight: Appropriate  Engagement in Group:  Engaged  Modes of Intervention:  Activity  Additional Comments:    Yolanda [name] 01/28/2023, 10:57 AM

## 2023-01-28 NOTE — [facility] Group Notes (Signed)
Spiritual care group on grief and loss facilitated by Chaplain Dyanne Carrel, [facility]  Group Goal: Support / Education around grief and loss  Members engage in facilitated group support and psycho-social education.  Group Description:  Following introductions and group rules, group members engaged in facilitated group dialogue and support around topic of loss, with particular support around experiences of loss in their lives. Group Identified types of loss (relationships / self / things) and identified patterns, circumstances, and changes that precipitate losses. Reflected on thoughts / feelings around loss, normalized grief responses, and recognized variety in grief experience. Group encouraged individual reflection on safe space and on the coping skills that they are already utilizing.  Group drew on Adlerian / Rogerian and narrative framework  Patient Progress: Yolanda [name] attended group and actively engaged and participated in group conversation and activities.  She shared about the loss of a family member and how she experienced grief.

## 2023-01-28 NOTE — Group Note (Signed)
Recreation Therapy Group Note   Group Topic:Team Building  Group Date: 01/28/2023 Start Time: 0935 End Time: 1015 Facilitators: Berlin Viereck-McCall, LRT,CTRS Location: [address] Dayroom   Goal Area(s) Addresses:  Patient will effectively work with peer towards shared goal.  Patient will identify skills used to make activity successful.  Patient will identify how skills used during activity can be used to reach post d/c goals.   Group Description: Straw Bridge. In teams of 3-5, patients were given 15 plastic drinking straws and an equal length of masking tape. Using the materials provided, patients were instructed to build a free standing bridge-like structure to suspend an everyday item (ex: puzzle box) off of the floor or table surface. All materials were required to be used by the team in their design. LRT facilitated post-activity discussion reviewing team process. Patients were encouraged to reflect how the skills used in this activity can be generalized to daily life post discharge.    Affect/Mood: Flat   Participation Level: None   Participation Quality: None   Behavior: Attentive    Speech/Thought Process: None   Insight: None   Judgement: None   Modes of Intervention: STEM Activity   Patient Response to Interventions:  Attentive   Education Outcome:  In group clarification offered    Clinical Observations/Individualized Feedback: Pt observed as peers completed activity.     Plan: Continue to engage patient in RT group sessions 2-3x/week.   Gene Colee-McCall, LRT,CTRS 01/28/2023 12:12 PM

## 2023-01-28 NOTE — Progress Notes (Signed)
Patient ID: Yolanda [name], female   DOB: [birth year], 47 y.o.   MRN: [id number] [facility] MD Progress Note  01/28/2023 1:05 PM Yolanda [name]  MRN:  [id number]  Reason for admission: This is the first psychiatric admission in this [facility] in 12 years for this 54 AA female with an extensive hx of mental illnesses & probable polysubstance use disorders. She is admitted to the [facility] from the [facility] hospital with complain of worsening suicidal ideations with plan to stab herself. Per chart review, patient apparently reported at the ED that she has been depressed for a while & has not been taking her mental health medications. After medical evaluation.clearance, she was transferred to the [facility] for further psychiatric evaluation/treatments.   APS report on 6/24 Has payee, no legal guardian Continues to await placement.  24 hr chart review: Sleep Hours last night: Sleep was good last night as per nursing reports, pt reports a poor sleep quality last night, probably because she stays in bed all day. Nursing Concerns: Isolative to room Behavioral episodes in the past 24 hrs: As above Medication Compliance:compliant Vital Signs in the past 24 hrs:WNL PRN Medications in the past 24 hrs: None  Patient assessment note: There are no changes from yesterday's assessment, we are continuing to await placement for patient. Pt in her room this morning sleeping in bed, required multiple positive verbal reinforcements from her nurse to get out of bed and go to day room to attend unit group sessions. She denies SI/HI/AVH, denies paranoia today, but as per nursing documentation, mentioned that she did not want to reside in nursing home. CSW is continuing to coordinate with DSS for safe placement outside of the hospital environment. Pt verbalizes being depressed, but states that this is secondary to feeling like she has been abandoned by her family. She denies being in any physical pain, denies medication related side  effects. She reports appetite today as good. We are continuing medications as listed below with no changes today.  Principal Problem: Schizoaffective disorder, depressive type (HCC)   Diagnosis: Principal Problem:   Schizoaffective disorder, depressive type (HCC) Active Problems:   GERD (gastroesophageal reflux disease)   Intellectual disability   Tobacco use disorder  Past Psychiatric History: Patient is not forth coming with the answers to the assessment questions. She is refusing to provide information about her mental health hx or symptoms.   Past Medical History:  Past Medical History:  Diagnosis Date   Bipolar affect, depressed (HCC)    Constipation 08/17/2022   Depression    Falls 07/21/2022   Fracture of femoral neck, right, closed (HCC) 01/17/2022   Herpes simplex 08/22/2017   Open fracture dislocation of right elbow joint 01/17/2022    Past Surgical History:  Procedure Laterality Date   NO PAST SURGERIES     SALPINGECTOMY     Family History: History reviewed. No pertinent family history.  Family Psychiatric  History: See H&P.  Social History:  Social History   Substance and Sexual Activity  Alcohol Use Yes     Social History   Substance and Sexual Activity  Drug Use Yes   Types: Cocaine, Marijuana    Social History   Socioeconomic History   Marital status: Single    Spouse name: Not on file   Number of children: Not on file   Years of education: Not on file   Highest education level: Not on file  Occupational History   Not on file  Tobacco Use  Smoking status: Every Day   Smokeless tobacco: Not on file  Substance and Sexual Activity   Alcohol use: Yes   Drug use: Yes    Types: Cocaine, Marijuana   Sexual activity: Yes  Other Topics Concern   Not on file  Social History Narrative   Not on file   Social Determinants of Health   Financial Resource Strain: Low Risk  (09/12/2022)   Received from [facility]   Overall  Financial Resource Strain (CARDIA)    Difficulty of Paying Living Expenses: Not hard at all  Food Insecurity: Patient Declined (12/20/2022)   Hunger Vital Sign    Worried About Running Out of Food in the Last Year: Patient declined    Ran Out of Food in the Last Year: Patient declined  Transportation Needs: No Transportation Needs (12/20/2022)   PRAPARE - Administrator, Civil Service (Medical): No    Lack of Transportation (Non-Medical): No  Physical Activity: Not on file  Stress: No Stress Concern Present (07/17/2022)   Received from [facility] [place] [facility] - Occupational Stress Questionnaire    Feeling of Stress : Not at all  Social Connections: Unknown (07/16/2022)   Received from [facility]   Social Network    Social Network: Not on file   Current Medications: Current Facility-Administered Medications  Medication Dose Route Frequency Provider Last Rate Last Admin   acetaminophen (TYLENOL) tablet 650 mg  650 mg Oral Q6H PRN [name], NP   650 mg at 01/19/23 1855   alum & mag hydroxide-simeth (MAALOX/MYLANTA) 200-200-20 MG/5ML suspension 30 mL  30 mL Oral Q4H PRN [name], NP       busPIRone (BUSPAR) tablet 15 mg  15 mg Oral BID [name], NP   15 mg at 01/28/23 0937   [START ON 02/02/2023] cyanocobalamin (VITAMIN B12) injection 1,000 mcg  1,000 mcg Intramuscular Q30 days Sarita Bottom, MD       diphenhydrAMINE (BENADRYL) capsule 50 mg  50 mg Oral TID PRN [name], NP   50 mg at 01/15/23 1211   Or   diphenhydrAMINE (BENADRYL) injection 50 mg  50 mg Intramuscular TID PRN [name], NP       feeding supplement (ENSURE ENLIVE / ENSURE PLUS) liquid 237 mL  237 mL Oral TID BM Marra Fraga, NP   237 mL at 01/28/23 1057   haloperidol (HALDOL) tablet 5 mg  5 mg Oral TID PRN [name], NP   5 mg at 01/15/23 1211   Or   haloperidol lactate (HALDOL) injection 5 mg  5 mg Intramuscular TID PRN  [name], NP       hydrOXYzine (ATARAX) tablet 25 mg  25 mg Oral TID PRN [name], DO   25 mg at 01/24/23 2043   LORazepam (ATIVAN) tablet 2 mg  2 mg Oral TID PRN [name], NP   2 mg at 01/15/23 1211   Or   LORazepam (ATIVAN) injection 2 mg  2 mg Intramuscular TID PRN [name], NP       magnesium hydroxide (MILK OF MAGNESIA) suspension 30 mL  30 mL Oral Daily PRN [name], NP       melatonin tablet 5 mg  5 mg Oral QHS Stokely Jeancharles, NP   5 mg at 01/27/23 2110   nicotine (NICODERM CQ - dosed in mg/24 hours) patch 21 mg  21 mg Transdermal Daily Cyndie Chime,  Raynelle Fanning, DO   21 mg at 01/28/23 1610   paliperidone (INVEGA) 24 hr tablet 6 mg  6 mg Oral Daily [name], NP   6 mg at 01/27/23 2110   pantoprazole (PROTONIX) EC tablet 40 mg  40 mg Oral Daily [name], NP   40 mg at 01/28/23 9604   polyethylene glycol (MIRALAX / GLYCOLAX) packet 17 g  17 g Oral Daily PRN [name], NP       sertraline (ZOLOFT) tablet 150 mg  150 mg Oral Daily Massengill, Harrold Donath, MD   150 mg at 01/28/23 5409   traZODone (DESYREL) tablet 50 mg  50 mg Oral QHS [name], NP   50 mg at 01/27/23 2110   Lab Results:  No results found for this or any previous visit (from the past 48 hour(s)).  Blood Alcohol level:  Lab Results  Component Value Date   ETH <10 12/19/2022   ETH <10 11/21/2019   Metabolic Disorder Labs: Lab Results  Component Value Date   HGBA1C 4.4 (L) 12/21/2022   MPG 79.58 12/21/2022   No results found for: "PROLACTIN" Lab Results  Component Value Date   CHOL 218 (H) 12/21/2022   TRIG 81 12/21/2022   HDL 53 12/21/2022   CHOLHDL 4.1 12/21/2022   VLDL 16 12/21/2022   LDLCALC 149 (H) 12/21/2022   LDLCALC 110 (H) 03/13/2011   Physical Findings: AIMS: Facial and Oral Movements Muscles of Facial Expression: None, normal Lips and Perioral Area: None, normal Jaw: None, normal Tongue: None, normal,Extremity Movements Upper (arms, wrists, hands, fingers): None,  normal Lower (legs, knees, ankles, toes): None, normal, Trunk Movements Neck, shoulders, hips: None, normal, Overall Severity Severity of abnormal movements (highest score from questions above): None, normal Incapacitation due to abnormal movements: None, normal Patient's awareness of abnormal movements (rate only patient's report): No Awareness, Dental Status Current problems with teeth and/or dentures?: No Does patient usually wear dentures?: No   Musculoskeletal: Strength & Muscle Tone: within normal limits Gait & Station: normal Patient leans: N/A  Psychiatric Specialty Exam:  Presentation  General Appearance:  Lying down in bed, better hygiene noted  Eye Contact: Fair Speech: Slow  Speech Volume: Decreased  Handedness: Right  Mood and Affect  Mood: Depressed  Affect: Congruent  Thought Process  Thought Processes: Coherent  Descriptions of Associations:Intact  Orientation:Partial  Thought Content: Denies SI HI or AVH does not present responding to stimuli. Presentation consistent with moderate intellectual disability  History of Schizophrenia/Schizoaffective disorder:Yes  Duration of Psychotic Symptoms:Greater than six months  Hallucinations:Hallucinations: None  Ideas of Reference:None  Suicidal Thoughts:Suicidal Thoughts: No  Homicidal Thoughts:Homicidal Thoughts: No  Sensorium  Memory: Immediate Good  Judgment: Limited Insight: Limited Executive Functions  Concentration: Fair  Attention Span: Fair  Recall: Poor  Fund of Knowledge: Poor  Language: Fair  Psychomotor Activity  Psychomotor Activity: Psychomotor Activity: Normal  Assets  Assets: Resilience  Sleep  Sleep: Sleep: Good  Physical Exam: Physical Exam Vitals and nursing note reviewed.  Constitutional:      General: She is not in acute distress.    Appearance: She is obese. She is not ill-appearing or diaphoretic.  HENT:     Head: Normocephalic.      Comments: Intellectual disability    Nose: No congestion.     Mouth/Throat:     Pharynx: Oropharynx is clear.  Eyes:     Pupils: Pupils are equal, round, and reactive to light.  Cardiovascular:     Rate and Rhythm:  Normal rate.     Pulses: Normal pulses.  Pulmonary:     Effort: Pulmonary effort is normal. No respiratory distress.  Abdominal:     Comments: Deferred  Genitourinary:    Comments: Deferred Musculoskeletal:        General: Normal range of motion.     Cervical back: Normal range of motion.  Skin:    General: Skin is warm and dry.  Neurological:     General: No focal deficit present.     Mental Status: She is alert.  Psychiatric:     Comments: Mood is labile and sad.    Blood pressure 106/64, pulse 68, temperature 98.2 F (36.8 C), temperature source Oral, resp. rate 18, height 4\' 11"  (1.499 m), weight 63 kg, SpO2 99%. Body mass index is 28.05 kg/m.  Treatment Plan Summary: Daily contact with patient to assess and evaluate symptoms and progress in treatment and Medication management.   Still waiting on safe disposition as patient would not be able to live independently given cognitive impairment. Recommend assisted living.   No medication side effects.  Because of urinary incontinence, recommended adult diapers to be worn.  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  Plan:  -Continue Sertraline from 100 mg to 150 mg po Q daily for depression/anxiety -Continue Buspar 15 mg po bid for anxiety.  -Continue paliperidone 6 mg po qd for mood stabilization -Continue Trazodone 50 mg nightly for insomnia  -Continue Nicoderm 21 mg topically Q 24 hrs for nicotine withdrawal management. -Continue vitamin B12 1000  mcg IM weekly for 4 weeks then monthly -Continue Melatonin 5 mg nightly for sleep -Continue Protonix EC 40 mg p.o. daily for GERD -Change MiraLAX 17 g p.o. from twice daily for constipation to PRN since patient has been refusing it -Continue hydroxyzine 25 mg p.o. 3 times daily as needed for anxiety -Continue Ensure nutritional shakes TID in between meals  -Previously discontinued Hydroxyzine 50 mg nightly-hypotension in the mornings  Lab work and EKG, reviewed with no significant abnormalities noted, except slightly low b12 166.  01/05/2023, started on B12 1000 mcg injections q. weekly x 4 weeks, then q. monthly.  Safety and Monitoring: Voluntary admission to inpatient psychiatric unit for safety, stabilization and treatment Daily contact with patient to assess and evaluate symptoms and progress in treatment Patient's case to be discussed in multi-disciplinary team meeting Observation Level : q15 minute checks Vital signs: q12 hours Precautions: Safety   Discharge Planning: Social work and case management to assist with discharge planning and identification of hospital follow-up needs prior to discharge Estimated LOS: Unknown at this time. Discharge Concerns: Need to establish a safety plan; Medication compliance and effectiveness Discharge Goals: Return home with outpatient referrals for mental health follow-up including medication management/psychotherapy No legal guardian, has payee  [name], NP. 01/28/2023, 1:05 PM Patient ID: Yolanda [name], female   DOB: 1976/01/07, 47 y.o.    MRN: [id number] \

## 2023-01-28 NOTE — Group Note (Signed)
Date:  01/28/2023 Time:  9:22 AM  Group Topic/Focus:  Goals Group:   The focus of this group is to help patients establish daily goals to achieve during treatment and discuss how the patient can incorporate goal setting into their daily lives to aide in recovery.    Participation Level:  Did Not Attend    Yolanda [name] 01/28/2023, 9:22 AM

## 2023-01-29 DIAGNOSIS — F251 Schizoaffective disorder, depressive type: Secondary | ICD-10-CM | POA: Diagnosis not present

## 2023-01-29 NOTE — Progress Notes (Signed)
Patient ID: Yolanda [name], female   DOB: 21-Nov-1975, 47 y.o.   MRN: [id number] [facility] MD Progress Note  01/29/2023 3:35 PM Mahaila KINDSEY EBLIN  MRN:  [id number]  Reason for admission: This is the first psychiatric admission in this [facility] in 12 years for this 53 AA female with an extensive hx of mental illnesses & probable polysubstance use disorders. She is admitted to the [facility] from the [facility] hospital with complain of worsening suicidal ideations with plan to stab herself. Per chart review, patient apparently reported at the ED that she has been depressed for a while & has not been taking her mental health medications. After medical evaluation.clearance, she was transferred to the [facility] for further psychiatric evaluation/treatments.   APS report on 6/24 Has payee, no legal guardian Continues to await placement.  24 hr chart review: Sleep Hours last night: Good Nursing Concerns: Isolative to room, and room lock out being implemented Behavioral episodes in the past 24 hrs: As above Medication Compliance:compliant Vital Signs in the past 24 hrs:WNL PRN Medications in the past 24 hrs: None  Patient assessment note: There are no changes from yesterday's assessment, we are continuing to await placement for patient. Patient denies SI/HI/AVH, denies paranoia and there is no evidence of delusional thinking. Pt reports that her sleep quality last night was good, she reports a fair appetite, denies medication related side effects, states that she was able to move her bowels today, and is tolerating medications well. Today, pt talked about feeling sad because her mother will not allow her back home as she self injured at the home in the past.  Empathy and active listening was provided.   Repeating UA as last one was from 07/3 and had small leukocytes, cloudy in appearance with rare bacteria. Last vitamin B12 level was 166, rechecking level to make, sure it is trending upwards, and also rechecking vitamin D  level. We are continuing medications as listed below, and we will continue to monitor.  Continuing to await DSS to coordinate with our CSW's to place patient in a safe environment outside of the hospital.  Principal Problem: Schizoaffective disorder, depressive type (HCC)   Diagnosis: Principal Problem:   Schizoaffective disorder, depressive type (HCC) Active Problems:   GERD (gastroesophageal reflux disease)   Intellectual disability   Tobacco use disorder  Past Psychiatric History: Patient is not forth coming with the answers to the assessment questions. She is refusing to provide information about her mental health hx or symptoms.   Past Medical History:  Past Medical History:  Diagnosis Date   Bipolar affect, depressed (HCC)    Constipation 08/17/2022   Depression    Falls 07/21/2022   Fracture of femoral neck, right, closed (HCC) 01/17/2022   Herpes simplex 08/22/2017   Open fracture dislocation of right elbow joint 01/17/2022    Past Surgical History:  Procedure Laterality Date   NO PAST SURGERIES     SALPINGECTOMY     Family History: History reviewed. No pertinent family history.  Family Psychiatric  History: See H&P.  Social History:  Social History   Substance and Sexual Activity  Alcohol Use Yes     Social History   Substance and Sexual Activity  Drug Use Yes   Types: Cocaine, Marijuana    Social History   Socioeconomic History   Marital status: Single    Spouse name: Not on file   Number of children: Not on file   Years of education: Not on file  Highest education level: Not on file  Occupational History   Not on file  Tobacco Use   Smoking status: Every Day   Smokeless tobacco: Not on file  Substance and Sexual Activity   Alcohol use: Yes   Drug use: Yes    Types: Cocaine, Marijuana   Sexual activity: Yes  Other Topics Concern   Not on file  Social History Narrative   Not on file   Social Determinants of Health   Financial Resource  Strain: Low Risk  (09/12/2022)   Received from [facility]   Overall Financial Resource Strain (CARDIA)    Difficulty of Paying Living Expenses: Not hard at all  Food Insecurity: Patient Declined (12/20/2022)   Hunger Vital Sign    Worried About Running Out of Food in the Last Year: Patient declined    Ran Out of Food in the Last Year: Patient declined  Transportation Needs: No Transportation Needs (12/20/2022)   PRAPARE - Administrator, Civil Service (Medical): No    Lack of Transportation (Non-Medical): No  Physical Activity: Not on file  Stress: No Stress Concern Present (07/17/2022)   Received from [facility], [facility] of Occupational Health - Occupational Stress Questionnaire    Feeling of Stress : Not at all  Social Connections: Unknown (07/16/2022)   Received from [facility], [facility]   Social Network    Social Network: Not on file   Current Medications: Current Facility-Administered Medications  Medication Dose Route Frequency Provider Last Rate Last Admin   acetaminophen (TYLENOL) tablet 650 mg  650 mg Oral Q6H PRN [name], NP   650 mg at 01/19/23 1855   alum & mag hydroxide-simeth (MAALOX/MYLANTA) 200-200-20 MG/5ML suspension 30 mL  30 mL Oral Q4H PRN [name], NP   30 mL at 01/28/23 1530   busPIRone (BUSPAR) tablet 15 mg  15 mg Oral BID [name], NP   15 mg at 01/29/23 0900   [START ON 02/02/2023] cyanocobalamin (VITAMIN B12) injection 1,000 mcg  1,000 mcg Intramuscular Q30 days Sarita Bottom, MD       diphenhydrAMINE (BENADRYL) capsule 50 mg  50 mg Oral TID PRN [name], NP   50 mg at 01/15/23 1211   Or   diphenhydrAMINE (BENADRYL) injection 50 mg  50 mg Intramuscular TID PRN [name], NP       feeding supplement (ENSURE ENLIVE / ENSURE PLUS) liquid 237 mL  237 mL Oral TID BM Adilee Lemme, NP   237 mL at 01/29/23 1017   haloperidol (HALDOL) tablet 5 mg  5 mg Oral TID PRN [name],  NP   5 mg at 01/15/23 1211   Or   haloperidol lactate (HALDOL) injection 5 mg  5 mg Intramuscular TID PRN [name], NP       hydrOXYzine (ATARAX) tablet 25 mg  25 mg Oral TID PRN [name], DO   25 mg at 01/24/23 2043   LORazepam (ATIVAN) tablet 2 mg  2 mg Oral TID PRN [name], NP   2 mg at 01/15/23 1211   Or   LORazepam (ATIVAN) injection 2 mg  2 mg Intramuscular TID PRN [name], NP       magnesium hydroxide (MILK OF MAGNESIA) suspension 30 mL  30 mL Oral Daily PRN [name], NP       melatonin tablet 5 mg  5 mg Oral QHS [name], NP   5 mg  at 01/28/23 2119   nicotine (NICODERM CQ - dosed in mg/24 hours) patch 21 mg  21 mg Transdermal Daily [name], DO   21 mg at 01/29/23 0900   paliperidone (INVEGA) 24 hr tablet 6 mg  6 mg Oral Daily [name], NP   6 mg at 01/28/23 2119   pantoprazole (PROTONIX) EC tablet 40 mg  40 mg Oral Daily [name], NP   40 mg at 01/29/23 0900   polyethylene glycol (MIRALAX / GLYCOLAX) packet 17 g  17 g Oral Daily PRN [name], NP       sertraline (ZOLOFT) tablet 150 mg  150 mg Oral Daily Massengill, Nathan, MD   150 mg at 01/29/23 0900   traZODone (DESYREL) tablet 50 mg  50 mg Oral QHS [name], NP   50 mg at 01/27/23 2110   Lab Results:  No results found for this or any previous visit (from the past 48 hour(s)).  Blood Alcohol level:  Lab Results  Component Value Date   ETH <10 12/19/2022   ETH <10 11/21/2019   Metabolic Disorder Labs: Lab Results  Component Value Date   HGBA1C 4.4 (L) 12/21/2022   MPG 79.58 12/21/2022   No results found for: "PROLACTIN" Lab Results  Component Value Date   CHOL 218 (H) 12/21/2022   TRIG 81 12/21/2022   HDL 53 12/21/2022   CHOLHDL 4.1 12/21/2022   VLDL 16 12/21/2022   LDLCALC 149 (H) 12/21/2022   LDLCALC 110 (H) 03/13/2011   Physical Findings: AIMS: Facial and Oral Movements Muscles of Facial Expression: None, normal Lips and Perioral Area: None,  normal Jaw: None, normal Tongue: None, normal,Extremity Movements Upper (arms, wrists, hands, fingers): None, normal Lower (legs, knees, ankles, toes): None, normal, Trunk Movements Neck, shoulders, hips: None, normal, Overall Severity Severity of abnormal movements (highest score from questions above): None, normal Incapacitation due to abnormal movements: None, normal Patient's awareness of abnormal movements (rate only patient's report): No Awareness, Dental Status Current problems with teeth and/or dentures?: No Does patient usually wear dentures?: No   Musculoskeletal: Strength & Muscle Tone: within normal limits Gait & Station: normal Patient leans: N/A  Psychiatric Specialty Exam:  Presentation  General Appearance:  Lying down in bed, better hygiene noted  Eye Contact: Fair Speech: Clear and Coherent  Speech Volume: Decreased  Handedness: Right  Mood and Affect  Mood: Depressed; Anxious  Affect: Congruent  Thought Process  Thought Processes: Coherent  Descriptions of Associations:Intact  Orientation:Partial  Thought Content: Denies SI HI or AVH does not present responding to stimuli. Presentation consistent with moderate intellectual disability  History of Schizophrenia/Schizoaffective disorder:Yes  Duration of Psychotic Symptoms:Greater than six months  Hallucinations:Hallucinations: None  Ideas of Reference:None  Suicidal Thoughts:Suicidal Thoughts: No  Homicidal Thoughts:Homicidal Thoughts: No  Sensorium  Memory: Immediate Good  Judgment: Limited Insight: Limited Executive Functions  Concentration: Poor  Attention Span: Fair  Recall: Poor  Fund of Knowledge: Poor  Language: Fair  Psychomotor Activity  Psychomotor Activity: Psychomotor Activity: Normal  Assets  Assets: Communication Skills; Resilience  Sleep  Sleep: Sleep: Good  Physical Exam: Physical Exam Vitals and nursing note reviewed.   Constitutional:      General: She is not in acute distress.    Appearance: She is obese. She is not ill-appearing or diaphoretic.  HENT:     Head: Normocephalic.     Comments: Intellectual disability    Nose: No congestion.     Mouth/Throat:     Pharynx:  Oropharynx is clear.  Eyes:     Pupils: Pupils are equal, round, and reactive to light.  Cardiovascular:     Rate and Rhythm: Normal rate.     Pulses: Normal pulses.  Pulmonary:     Effort: Pulmonary effort is normal. No respiratory distress.  Abdominal:     Comments: Deferred  Genitourinary:    Comments: Deferred Musculoskeletal:        General: Normal range of motion.     Cervical back: Normal range of motion.  Skin:    General: Skin is warm and dry.  Neurological:     General: No focal deficit present.     Mental Status: She is alert.  Psychiatric:     Comments: Mood is labile and sad.    Blood pressure 106/64, pulse 68, temperature 98.2 F (36.8 C), temperature source Oral, resp. rate 18, height 4\' 11"  (1.499 m), weight 63 kg, SpO2 99%. Body mass index is 28.05 kg/m.  Treatment Plan Summary: Daily contact with patient to assess and evaluate symptoms and progress in treatment and Medication management.   Still waiting on safe disposition as patient would not be able to live independently given cognitive impairment. Recommend assisted living.   No medication side effects.  Because of urinary incontinence, recommended adult diapers to be worn.  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  Plan:  -Continue Sertraline from 100 mg to 150 mg po Q daily for depression/anxiety -Continue Buspar 15 mg po bid for anxiety.  -Continue paliperidone 6 mg po qd for  mood stabilization -Continue Trazodone 50 mg nightly for insomnia  -Continue Nicoderm 21 mg topically Q 24 hrs for nicotine withdrawal management. -Continue vitamin B12 1000 mcg IM weekly for 4 weeks then monthly -Continue Melatonin 5 mg nightly for sleep -Continue Protonix EC 40 mg p.o. daily for GERD -Continue MiraLAX 17 g p.o. PRN for constipation -Continue hydroxyzine 25 mg p.o. 3 times daily as needed for anxiety -Continue Ensure nutritional shakes TID in between meals  -Previously discontinued Hydroxyzine 50 mg nightly-hypotension in the mornings  Lab work and EKG, reviewed with no significant abnormalities noted, except slightly low b12 166.  01/05/2023, started on B12 1000 mcg injections q. weekly x 4 weeks, then q. monthly.  Safety and Monitoring: Voluntary admission to inpatient psychiatric unit for safety, stabilization and treatment Daily contact with patient to assess and evaluate symptoms and progress in treatment Patient's case to be discussed in multi-disciplinary team meeting Observation Level : q15 minute checks Vital signs: q12 hours Precautions: Safety   Discharge Planning: Social work and case management to assist with discharge planning and identification of hospital follow-up needs prior to discharge Estimated LOS: Unknown at this time. Discharge Concerns: Need to establish a safety plan; Medication compliance and effectiveness Discharge Goals: Return home with outpatient referrals for mental health follow-up including medication management/psychotherapy No legal guardian, has payee  [name], NP. 01/29/2023, 3:35 PM Patient ID: Larey Brick, female   DOB: May 27, 1976, 47 y.o.    MRN: [id number]

## 2023-01-29 NOTE — Plan of Care (Signed)
Nurse discussed anxiety and coping skills with patient. 

## 2023-01-29 NOTE — Group Note (Signed)
Recreation Therapy Group Note   Group Topic:Animal Assisted Therapy   Group Date: 01/29/2023 Start Time: 3016 End Time: 1034 Facilitators: Zalen Sequeira-McCall, LRT,CTRS Location: [address] Dayroom   Animal-Assisted Activity (AAA) Program Checklist/Progress Notes Patient Eligibility Criteria Checklist & Daily Group note for Rec Tx Intervention  AAA/T Program Assumption of Risk Form signed by Patient/ or Parent Legal Guardian Yes  Patient is free of allergies or severe asthma Yes  Patient reports no fear of animals Yes  Patient reports no history of cruelty to animals Yes  Patient understands his/her participation is voluntary Yes  Patient washes hands before animal contact Yes  Patient washes hands after animal contact Yes   Affect/Mood: Flat   Participation Level: Moderate   Participation Quality: Independent   Behavior: Reserved   Speech/Thought Process: Barely audible    Insight: Limited   Judgement: Limited   Modes of Intervention: Teaching laboratory technician   Patient Response to Interventions:  Attentive   Education Outcome:  In group clarification offered    Clinical Observations/Individualized Feedback: Pt was quiet and reserved. Pt had some interaction with the therapy dog but mostly stayed to herself.    Plan: Continue to engage patient in RT group sessions 2-3x/week.   Hajira Verhagen-McCall, LRT,CTRS  01/29/2023 12:58 PM

## 2023-01-29 NOTE — Group Note (Signed)
LCSW Group Therapy Note  Group Date: 01/29/2023 Start Time: 1100 End Time: 1145   Type of Therapy and Topic:  Group Therapy - Healthy vs Unhealthy Coping Skills  Participation Level:  Active   Description of Group The focus of this group was to determine what unhealthy coping techniques typically are used by group members and what healthy coping techniques would be helpful in coping with various problems. Patients were guided in becoming aware of the differences between healthy and unhealthy coping techniques. Patients were asked to identify 2-3 healthy coping skills they would like to learn to use more effectively.  Therapeutic Goals Patients learned that coping is what human beings do all day long to deal with various situations in their lives Patients defined and discussed healthy vs unhealthy coping techniques Patients identified their preferred coping techniques and identified whether these were healthy or unhealthy Patients determined 2-3 healthy coping skills they would like to become more familiar with and use more often. Patients provided support and ideas to each other   Summary of Patient Progress:  During group, Tomarris expressed interest. Patient proved open to input from peers and feedback from CSW. Patient demonstrated positive insight into the subject matter, was respectful of peers, and participated throughout the entire session.   Therapeutic Modalities Cognitive Behavioral Therapy Motivational Interviewing  Starleen Arms, Theresia Majors 01/29/2023  1:22 PM

## 2023-01-29 NOTE — [facility] Group Notes (Signed)
[facility] Group Notes:  (Nursing/MHT/Case Management/Adjunct)  Date:  01/29/2023  Time:  10:19 PM  Type of Therapy:   Wrap-up group  Participation Level:  Active  Participation Quality:  Appropriate  Affect:  Appropriate  Cognitive:  Appropriate  Insight:  Appropriate  Engagement in Group:  Engaged  Modes of Intervention:  Education  Summary of Progress/Problems: Pt goal to attend groups. Pt reports she attended all groups. Day 5/10.   Noah Delaine 01/29/2023, 10:19 PM

## 2023-01-29 NOTE — Progress Notes (Signed)
Woke patient up for toilet ing. She reports she has already gone to bathroom. Incontinent large amount of urine in bed. Linens changed. Patient changed clothes and performed hygiene.

## 2023-01-29 NOTE — Plan of Care (Signed)
Patient participated in group tonight. She is seen smiling and interacting some with her peers. No physical complaints.Compliant with medications.

## 2023-01-29 NOTE — Progress Notes (Addendum)
D:  Patient denied SI and HI, contracts for safety.  Denied A/V hallucinations.  Denied pain. A:  Medications administered per MD orders.  Emotional support and encouragement given patient. R:  Safety maintained with 15 minute checks. Patient has attended some groups today.  Patient's self inventory sheet, patient has fair sleep, no sleep medication.   Good appetite, low energy level, good concentration.  Rated depression, hopeless and anxiety 10.  Denied withdrawals.  Denied SI.  Denied physical problems.  Denied physical pain.  Goal is groups.  Plans was to get out of bed.  Patient has been given UA container per MD order.

## 2023-01-29 NOTE — Progress Notes (Signed)
   01/28/23 2000  Psychosocial Assessment  Patient Complaints Anxiety;Depression  Eye Contact Brief  Facial Expression Anxious  Affect Depressed;Anxious  Speech Logical/coherent;Soft  Interaction Childlike  Motor Activity Slow  Appearance/Hygiene Body odor;Poor hygiene  Behavior Characteristics Cooperative  Mood Depressed;Anxious  Thought Process  Coherency WDL  Content WDL  Delusions None reported or observed  Perception WDL  Hallucination None reported or observed  Judgment Poor  Confusion None  Danger to Self  Current suicidal ideation? Denies  Self-Injurious Behavior No self-injurious ideation or behavior indicators observed or expressed

## 2023-01-29 NOTE — Progress Notes (Signed)
   01/29/23 2330  Psych Admission Type (Psych Patients Only)  Admission Status Voluntary  Psychosocial Assessment  Patient Complaints Anxiety;Depression  Eye Contact Brief  Facial Expression Anxious  Affect Anxious;Depressed  Speech Soft;Slow  Interaction Childlike  Motor Activity Slow  Appearance/Hygiene Disheveled  Behavior Characteristics Cooperative  Mood Depressed;Anxious  Thought Process  Coherency WDL  Content WDL  Delusions None reported or observed  Perception WDL  Hallucination Auditory  Judgment Poor  Confusion None  Danger to Self  Current suicidal ideation? Denies  Agreement Not to Harm Self Yes  Description of Agreement verbal  Danger to Others  Danger to Others None reported or observed

## 2023-01-30 DIAGNOSIS — F251 Schizoaffective disorder, depressive type: Secondary | ICD-10-CM | POA: Diagnosis not present

## 2023-01-30 LAB — URINALYSIS, ROUTINE W REFLEX MICROSCOPIC
Bilirubin Urine: NEGATIVE
Glucose, UA: NEGATIVE mg/dL
Hgb urine dipstick: NEGATIVE
Ketones, ur: NEGATIVE mg/dL
Leukocytes,Ua: NEGATIVE
Nitrite: NEGATIVE
Protein, ur: NEGATIVE mg/dL
Specific Gravity, Urine: 1.01 (ref 1.005–1.030)
pH: 8 (ref 5.0–8.0)

## 2023-01-30 LAB — VITAMIN B12: Vitamin B-12: 752 pg/mL (ref 180–914)

## 2023-01-30 LAB — VITAMIN D 25 HYDROXY (VIT D DEFICIENCY, FRACTURES): Vit D, 25-Hydroxy: 19.7 ng/mL — ABNORMAL LOW (ref 30–100)
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
## 2023-01-30 NOTE — Plan of Care (Signed)
  Problem: Activity: Goal: Interest or engagement in leisure activities will improve Outcome: Progressing   Problem: Coping: Goal: Will verbalize feelings Outcome: Progressing   Problem: Health Behavior/Discharge Planning: Goal: Compliance with therapeutic regimen will improve Outcome: Progressing

## 2023-01-30 NOTE — Plan of Care (Signed)
  Problem: Education: Goal: Utilization of techniques to improve thought processes will improve Outcome: Progressing Goal: Knowledge of the prescribed therapeutic regimen will improve Outcome: Progressing   Problem: Activity: Goal: Interest or engagement in leisure activities will improve Outcome: Progressing Goal: Imbalance in normal sleep/wake cycle will improve Outcome: Progressing   Problem: Coping: Goal: Coping ability will improve Outcome: Progressing   

## 2023-01-30 NOTE — Progress Notes (Signed)
   01/30/23 2247  Psych Admission Type (Psych Patients Only)  Admission Status Voluntary  Psychosocial Assessment  Patient Complaints Anxiety;Depression  Eye Contact Brief  Facial Expression Anxious  Affect Anxious;Depressed  Speech Soft;Slow  Interaction Childlike  Motor Activity Slow  Appearance/Hygiene Disheveled  Behavior Characteristics Cooperative  Mood Depressed;Anxious  Thought Process  Coherency WDL  Content WDL  Delusions None reported or observed  Perception WDL  Hallucination None reported or observed  Judgment Poor  Confusion None  Danger to Self  Current suicidal ideation? Denies  Agreement Not to Harm Self Yes  Description of Agreement verbal  Danger to Others  Danger to Others None reported or observed

## 2023-01-30 NOTE — [facility] Group Notes (Signed)
[facility] Group Notes:  (Nursing/MHT/Case Management/Adjunct)  Date:  01/30/2023  Time: 2000  Type of Therapy:   Narcotics Anonymous meeting  Participation Level:  Active  Participation Quality:  Appropriate, Attentive, and Supportive  Affect:  Anxious and Depressed  Cognitive:  Lacking  Insight:  Limited  Engagement in Group:  Engaged  Modes of Intervention:  Education and Support  Summary of Progress/Problems:  [name] 01/30/2023, 9:48 PM

## 2023-01-30 NOTE — Group Note (Signed)
Recreation Therapy Group Note   Group Topic:Other  Group Date: 01/30/2023 Start Time: 1305 End Time: 1448 Facilitators: Nellie Pester-McCall, LRT,CTRS Location: [address] Dayroom   Activity Description/Intervention: Therapeutic Drumming. Patients with peers and staff were given the opportunity to engage in a leader facilitated [facility]HYTH[facility] Group Empowerment Drumming Circle with staff from the [employer], in partnership with [employer]. Teaching laboratory technician and trained [employer], Theodoro Doing leading with LRT observing and documenting intervention and pt response. This evidenced-based practice targets 7 areas of health and wellbeing in the human experience including: stress-reduction, exercise, self-expression, camaraderie/support, nurturing, spirituality, and music-making (leisure).   Goal Area(s) Addresses:  Patient will engage in pro-social way in music group.  Patient will follow directions of drum leader on the first prompt. Patient will demonstrate no behavioral issues during group.  Patient will identify if a reduction in stress level occurs as a result of participation in therapeutic drum circle.    Education: Leisure exposure, Pharmacologist, Musical expression, Discharge Planning   Affect/Mood: Appropriate   Participation Level: Engaged   Participation Quality: Independent   Behavior: Appropriate   Speech/Thought Process: Focused   Insight: Fair   Judgement: Fair    Modes of Intervention: Teaching laboratory technician   Patient Response to Interventions:  Engaged   Education Outcome:  Acknowledges education   Clinical Observations/Individualized Feedback: Yolanda [name] actively engaged in therapeutic drumming exercise and discussions. Pt was appropriate with peers, staff, and musical equipment for duration of programming.  Pt identified "ok" as their feeling after participation in music-based programming. Pt affect congruent/incongruent with verbalized  emotion.    Plan: Continue to engage patient in RT group sessions 2-3x/week.   Yolanda [name], LRT,CTRS 01/30/2023 3:53 PM

## 2023-01-30 NOTE — [facility] Group Notes (Signed)
Spiritual care group facilitated by Chaplain Katy Claussen, BCC  Group focused on topic of strength. Group members reflected on what thoughts and feelings emerge when they hear this topic. They then engaged in facilitated dialog around how strength is present in their lives. This dialog focused on representing what strength had been to them in their lives (images and patterns given) and what they saw as helpful in their life now (what they needed / wanted).  Activity drew on narrative framework.  Patient Progress: Did not attend.  

## 2023-01-30 NOTE — Progress Notes (Signed)
   01/30/23 0759  Psych Admission Type (Psych Patients Only)  Admission Status Voluntary  Psychosocial Assessment  Patient Complaints Anxiety;Depression  Eye Contact Brief  Facial Expression Anxious  Affect Anxious;Depressed  Speech Slow;Soft  Interaction Childlike  Motor Activity Slow  Appearance/Hygiene Disheveled  Behavior Characteristics Cooperative  Mood Depressed;Anxious  Thought Process  Coherency WDL  Content WDL  Delusions None reported or observed  Perception WDL  Hallucination None reported or observed  Judgment Poor  Confusion None  Danger to Self  Current suicidal ideation? Denies  Agreement Not to Harm Self Yes  Description of Agreement Verbal  Danger to Others  Danger to Others None reported or observed

## 2023-01-30 NOTE — Progress Notes (Signed)
Money requested to meet individually with chaplain after meeting chaplain at a group she attended.  She shared that she is feeling bad about "being slow."  She feels that she is more capable than people assume and that society has not treated her well. She is a proud mother of two adult daughters and grandmother to her 24 y-o grandson.  She attempted to get her GED previously, but did not ever take the test.  She feels that she can live independently and does not need to live in a group home.  Chaplain encouraged her to find ways to live into independence such as continuing to work towards her GED.  Chaplain reflected back to her that she is more than her mental health diagnoses.   Chaplain will continue to follow for support as time permits.  Chaplain Katy Melvine Julin, Bcc PAger, [phone]

## 2023-01-31 DIAGNOSIS — F251 Schizoaffective disorder, depressive type: Secondary | ICD-10-CM | POA: Diagnosis not present

## 2023-01-31 NOTE — Progress Notes (Signed)
Patient tearful and requested PRN hyroxyzine this afternoon. Patient states voice is telling her to kill herself.

## 2023-01-31 NOTE — [facility] Counselor (Signed)
CSW met with the Department of Social Services (DSS) who was accompanied by her colleague Jomarie Longs, who conducted a competency exam to determine intellectual competence. Pt was evaluated and was determined to have scored in the "normal range". Ms. Thamas Jaegers (DSS) provided an update concerning placement and asserts that the pt has asked to be placed with the "[facility]" agency, which will be designated to find placement for the patient . CSW will continue to monitor and provide guidance as needed.

## 2023-01-31 NOTE — Group Note (Signed)
Date:  01/31/2023 Time:  6:35 PM  Group Topic/Focus:  Wellness Toolbox:   The focus of this group is to discuss various aspects of wellness, balancing those aspects and exploring ways to increase the ability to experience wellness.  Patients will create a wellness toolbox for use upon discharge.    Participation Level:  Did Not Attend  Participation Quality:      Affect:      Cognitive:      Insight: None  Engagement in Group:    Modes of Intervention:      Additional Comments:     [name] 01/31/2023, 6:35 PM

## 2023-01-31 NOTE — Progress Notes (Addendum)
Patient reports that overall mood is "good." Depression is "good" and anxiety is "good." Patient's goals are to stay up and get out of bed. Patient has been visible in the milieu somewhat. Patient not observed to be crying today and her hygiene and appearance are improved. Patient remains safe on the unit Q 15 min safety checks are ongoing.   01/31/23 1000  Psych Admission Type (Psych Patients Only)  Admission Status Voluntary  Psychosocial Assessment  Patient Complaints None  Eye Contact Brief  Facial Expression Animated  Affect Sad  Speech Soft;Slow  Motor Activity Slow  Appearance/Hygiene Disheveled  Behavior Characteristics Cooperative  Mood Pleasant;Sad  Thought Process  Coherency WDL  Content WDL  Delusions None reported or observed  Perception WDL  Hallucination None reported or observed  Judgment Poor  Confusion None  Danger to Self  Current suicidal ideation? Denies  Agreement Not to Harm Self Yes  Description of Agreement verbal  Danger to Others  Danger to Others None reported or observed

## 2023-01-31 NOTE — Group Note (Signed)
  Type of Therapy and Topic: Group Therapy: Relationship Check-Ins   Participation Level: Did Not Attend   Description Group:  In this group patients are encouraged to rate how well or not well they are able to improve their relationships in Beliefs and Values, Communication, Family and Friends, Actuary and Household, and Intimacy. Patients will be able to discuss and identify what is going well within these aspects and what is not. Patients will be able to find appropriate ways, solutions, and skills that will help them within the selected relationships category. Patients will be encouraged to share and reflect on why things within their relationships are not going so well and get feedback from the instructor or their peers.  This group will be solution focused and process-oriented with patients' participation in sharing and listening to their own and peers experience; along with receive support and advice on how to improve or change the circumstance that is known to be challenging in their relationships category (Beliefs and Values, Communication, Family and Friends, Actuary and Household, and Intimacy).   Therapeutic Goals:  Patient will identify their strengths and weakness within their Beliefs and Values, Communication, Family and Friends, Actuary and Household, and Intimacy relationships. Patient will identify reasons why and how they can improve their relationships.  Patient will identify how they can be supportive and honest to themselves and in their relationships.  Patient will be able to gain support and give support to others with similar challenges.   Summary of Patient Progress    Therapeutic Modalities:  Solution Focused Therapy Cognitive Behavioral Therapy  Psychodynamic Therapy  Dialectical Behavior Therapy     Marinda Elk, LCSW 01/31/2023  1:14 PM

## 2023-01-31 NOTE — Plan of Care (Signed)

## 2023-01-31 NOTE — Plan of Care (Signed)
  Problem: Activity: Goal: Interest or engagement in leisure activities will improve Outcome: Progressing Goal: Imbalance in normal sleep/wake cycle will improve Outcome: Progressing   Problem: Coping: Goal: Coping ability will improve Outcome: Progressing Goal: Will verbalize feelings Outcome: Progressing   Problem: Health Behavior/Discharge Planning: Goal: Ability to make decisions will improve Outcome: Progressing Goal: Compliance with therapeutic regimen will improve Outcome: Progressing
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
## 2023-02-01 ENCOUNTER — Encounter (HOSPITAL_COMMUNITY): Payer: Self-pay

## 2023-02-01 DIAGNOSIS — F251 Schizoaffective disorder, depressive type: Secondary | ICD-10-CM | POA: Diagnosis not present

## 2023-02-01 NOTE — Progress Notes (Signed)
   02/01/23 1000  Psych Admission Type (Psych Patients Only)  Admission Status Voluntary  Psychosocial Assessment  Patient Complaints None  Eye Contact Brief  Facial Expression Flat  Affect Sad  Speech Soft;Slow  Interaction Childlike  Motor Activity Slow  Appearance/Hygiene Disheveled  Behavior Characteristics Cooperative;Appropriate to situation  Mood Pleasant;Depressed  Thought Process  Coherency WDL  Content WDL  Delusions None reported or observed  Perception WDL  Hallucination None reported or observed  Judgment Poor  Confusion None  Danger to Self  Current suicidal ideation? Denies  Agreement Not to Harm Self Yes  Description of Agreement verbal  Danger to Others  Danger to Others None reported or observed

## 2023-02-01 NOTE — Progress Notes (Signed)
Patient ID: Yolanda [name], female   DOB: [birth year], 47 y.o.   MRN: [id number] [facility] MD Progress Note  02/01/2023 3:30 PM Yolanda [name]  MRN:  [id number]  Reason for admission: This is the first psychiatric admission in this [facility] in 12 years for this 34 AA female with an extensive hx of mental illnesses & probable polysubstance use disorders. She is admitted to the [facility] from the [facility] hospital with complain of worsening suicidal ideations with plan to stab herself. Per chart review, patient apparently reported at the ED that she has been depressed for a while & has not been taking her mental health medications. After medical evaluation.clearance, she was transferred to the [facility] for further psychiatric evaluation/treatments.   APS report on 12/31/22 Has payee, no legal guardian Continues to await placement.  24 hr chart review: Sleep Hours last night: Good Nursing Concerns: Isolative to room & room lock out being implemented Behavioral episodes in the past 24 hrs: As above Medication Compliance: Compliant Vital Signs in the past 24 hrs: WNL PRN Medications in the past 24 hrs: None  Daily notes note: Yolanda [name] is seen in her room, chart reviewed. The chart findings discussed with the treatment team. She was lying down in bed asleep. She is arousable. She presents alert, oriented & aware of situation. She is visible on the unit, attending group sessions with encouragement/room lock-out. She reports, "I'm okay. I will come out of my room later. I will go to groups today". Patient at this time does not have any complaints. She is hoping & awaiting for some form of good news about placement. The DSS are currently working to find placement for patient, likely in a group home setting. Until that happens, we will continue to work with patient & encouraged her to continue to take her medications & remain active in the group sessions. She currently denies any SIHI, AVH, delusional thoughts or paranoia.  She does not appear to be responding to any internal stimuli.  Principal Problem: Schizoaffective disorder, depressive type (HCC)   Diagnosis: Principal Problem:   Schizoaffective disorder, depressive type (HCC) Active Problems:   GERD (gastroesophageal reflux disease)   Intellectual disability   Tobacco use disorder  Past Psychiatric History: Patient is not forth coming with the answers to the assessment questions. She is refusing to provide information about her mental health hx or symptoms.   Past Medical History:  Past Medical History:  Diagnosis Date   Bipolar affect, depressed (HCC)    Constipation 08/17/2022   Depression    Falls 07/21/2022   Fracture of femoral neck, right, closed (HCC) 01/17/2022   Herpes simplex 08/22/2017   Open fracture dislocation of right elbow joint 01/17/2022    Past Surgical History:  Procedure Laterality Date   NO PAST SURGERIES     SALPINGECTOMY     Family History: History reviewed. No pertinent family history.  Family Psychiatric  History: See H&P.  Social History:  Social History   Substance and Sexual Activity  Alcohol Use Yes     Social History   Substance and Sexual Activity  Drug Use Yes   Types: Cocaine, Marijuana    Social History   Socioeconomic History   Marital status: Single    Spouse name: Not on file   Number of children: Not on file   Years of education: Not on file   Highest education level: Not on file  Occupational History   Not on file  Tobacco Use  Smoking status: Every Day   Smokeless tobacco: Not on file  Substance and Sexual Activity   Alcohol use: Yes   Drug use: Yes    Types: Cocaine, Marijuana   Sexual activity: Yes  Other Topics Concern   Not on file  Social History Narrative   Not on file   Social Determinants of Health   Financial Resource Strain: Low Risk  (09/12/2022)   Received from [facility]   Overall Financial Resource Strain (CARDIA)    Difficulty of Paying  Living Expenses: Not hard at all  Food Insecurity: Patient Declined (12/20/2022)   Hunger Vital Sign    Worried About Running Out of Food in the Last Year: Patient declined    Ran Out of Food in the Last Year: Patient declined  Transportation Needs: No Transportation Needs (12/20/2022)   PRAPARE - Administrator, Civil Service (Medical): No    Lack of Transportation (Non-Medical): No  Physical Activity: Not on file  Stress: No Stress Concern Present (07/17/2022)   [facility] - Occupational Stress Questionnaire    Feeling of Stress : Not at all  Social Connections: Unknown (07/16/2022)   Received from [facility]   Social Network    Social Network: Not on file   Current Medications: Current Facility-Administered Medications  Medication Dose Route Frequency Provider Last Rate Last Admin   acetaminophen (TYLENOL) tablet 650 mg  650 mg Oral Q6H PRN [name], NP   650 mg at 01/19/23 1855   alum & mag hydroxide-simeth (MAALOX/MYLANTA) 200-200-20 MG/5ML suspension 30 mL  30 mL Oral Q4H PRN [name], NP   30 mL at 01/28/23 1530   busPIRone (BUSPAR) tablet 15 mg  15 mg Oral BID [name], NP   15 mg at 02/01/23 0840   [START ON 02/02/2023] cyanocobalamin (VITAMIN B12) injection 1,000 mcg  1,000 mcg Intramuscular Q30 days Sarita Bottom, MD       diphenhydrAMINE (BENADRYL) capsule 50 mg  50 mg Oral TID PRN [name], NP   50 mg at 01/15/23 1211   Or   diphenhydrAMINE (BENADRYL) injection 50 mg  50 mg Intramuscular TID PRN [name], NP       feeding supplement (ENSURE ENLIVE / ENSURE PLUS) liquid 237 mL  237 mL Oral TID BM Nkwenti, Doris, NP   237 mL at 02/01/23 1522   haloperidol (HALDOL) tablet 5 mg  5 mg Oral TID PRN [name], NP   5 mg at 01/15/23 1211   Or   haloperidol lactate (HALDOL) injection 5 mg  5 mg Intramuscular TID PRN [name], NP       hydrOXYzine  (ATARAX) tablet 25 mg  25 mg Oral TID PRN [name], DO   25 mg at 01/31/23 2112   LORazepam (ATIVAN) tablet 2 mg  2 mg Oral TID PRN [name], NP   2 mg at 01/15/23 1211   Or   LORazepam (ATIVAN) injection 2 mg  2 mg Intramuscular TID PRN [name], NP       magnesium hydroxide (MILK OF MAGNESIA) suspension 30 mL  30 mL Oral Daily PRN [name], NP       melatonin tablet 5 mg  5 mg Oral QHS Nkwenti, Doris, NP   5 mg at 01/31/23 2111   nicotine (NICODERM CQ - dosed in mg/24 hours) patch 21 mg  21 mg  Transdermal Daily [name], DO   21 mg at 01/30/23 0801   paliperidone (INVEGA) 24 hr tablet 6 mg  6 mg Oral Daily [name], NP   6 mg at 01/31/23 2111   pantoprazole (PROTONIX) EC tablet 40 mg  40 mg Oral Daily [name], NP   40 mg at 02/01/23 0840   polyethylene glycol (MIRALAX / GLYCOLAX) packet 17 g  17 g Oral Daily PRN [name], NP       sertraline (ZOLOFT) tablet 150 mg  150 mg Oral Daily Massengill, Nathan, MD   150 mg at 02/01/23 0840   traZODone (DESYREL) tablet 50 mg  50 mg Oral QHS [name], NP   50 mg at 01/31/23 2111   Lab Results:  Results for orders placed or performed during the hospital encounter of 12/20/22 (from the past 48 hour(s))  Vitamin B12     Status: None   Collection Time: 01/30/23  6:17 PM  Result Value Ref Range   Vitamin B-12 752 180 - 914 pg/mL    Comment: (NOTE) This assay is not validated for testing neonatal or myeloproliferative syndrome specimens for Vitamin B12 levels. Performed at [facility], [address] [place], [place] [address]   VITAMIN D 25 Hydroxy (Vit-D Deficiency, Fractures)     Status: Abnormal   Collection Time: 01/30/23  6:17 PM  Result Value Ref Range   Vit D, 25-Hydroxy 19.70 (L) 30 - 100 ng/mL    Comment: (NOTE) Vitamin D deficiency has been defined by the Institute of Medicine  and an Endocrine Society practice guideline as a level of serum 25-OH  vitamin D less than  20 ng/mL (1,2). The Endocrine Society went on to  further define vitamin D insufficiency as a level between 21 and 29  ng/mL (2).  1. IOM (Institute of Medicine). 2010. Dietary reference intakes for  calcium and D. [place]: [employer]. 2. Holick MF, Binkley Lebo, Bischoff-Ferrari HA, et al. Evaluation,  treatment, and prevention of vitamin D deficiency: an Endocrine  Society clinical practice guideline, JCEM. 2011 Jul; 96(7): 1911-30.  Performed at [facility], [address], [place], [place] [address]   Urinalysis, Routine w reflex microscopic -Urine, Clean Catch     Status: Abnormal   Collection Time: 01/30/23  7:01 PM  Result Value Ref Range   Color, Urine STRAW (A) YELLOW   APPearance CLEAR CLEAR   Specific Gravity, Urine 1.010 1.005 - 1.030   pH 8.0 5.0 - 8.0   Glucose, UA NEGATIVE NEGATIVE mg/dL   Hgb urine dipstick NEGATIVE NEGATIVE   Bilirubin Urine NEGATIVE NEGATIVE   Ketones, ur NEGATIVE NEGATIVE mg/dL   Protein, ur NEGATIVE NEGATIVE mg/dL   Nitrite NEGATIVE NEGATIVE   Leukocytes,Ua NEGATIVE NEGATIVE    Comment: Performed at [facility], [address] [place], [place] [address]    Blood Alcohol level:  Lab Results  Component Value Date   [facility] <10 12/19/2022   ETH <10 11/21/2019   Metabolic Disorder Labs: Lab Results  Component Value Date   HGBA1C 4.4 (L) 12/21/2022   MPG 79.58 12/21/2022   No results found for: "PROLACTIN" Lab Results  Component Value Date   CHOL 218 (H) 12/21/2022   TRIG 81 12/21/2022   HDL 53 12/21/2022   CHOLHDL 4.1 12/21/2022   VLDL 16 12/21/2022   LDLCALC 149 (H) 12/21/2022   LDLCALC 110 (H) 03/13/2011   Physical Findings: AIMS: Facial and Oral  Movements Muscles of Facial Expression: None, normal Lips and Perioral Area: None, normal Jaw: None, normal Tongue: None, normal,Extremity Movements Upper (arms, wrists, hands, fingers): None, normal Lower (legs, knees, ankles, toes):  None, normal, Trunk Movements Neck, shoulders, hips: None, normal, Overall Severity Severity of abnormal movements (highest score from questions above): None, normal Incapacitation due to abnormal movements: None, normal Patient's awareness of abnormal movements (rate only patient's report): No Awareness, Dental Status Current problems with teeth and/or dentures?: No Does patient usually wear dentures?: No   Musculoskeletal: Strength & Muscle Tone: within normal limits Gait & Station: normal Patient leans: N/A  Psychiatric Specialty Exam:  Presentation  General Appearance:  Lying down in bed, better hygiene noted  Eye Contact: Fair Speech: Slow (slurred at times.)  Speech Volume: Decreased  Handedness: Right  Mood and Affect  Mood: Depressed; Hopeless  Affect: Congruent; Depressed; Flat  Thought Process  Thought Processes: Goal Directed  Descriptions of Associations:Intact  Orientation:Full (Time, Place and Person)  Thought Content: Denies SI HI or AVH does not present responding to stimuli. Presentation consistent with moderate intellectual disability  History of Schizophrenia/Schizoaffective disorder:Yes  Duration of Psychotic Symptoms:Greater than six months  Hallucinations:Hallucinations: None Description of Auditory Hallucinations: NA   Ideas of Reference:None  Suicidal Thoughts:Suicidal Thoughts: No SI Active Intent and/or Plan: Without Intent; Without Plan; Without Means to Carry Out; Without Access to Means SI Passive Intent and/or Plan: Without Intent; Without Plan; Without Means to Carry Out; Without Access to Means   Homicidal Thoughts:Homicidal Thoughts: No   Sensorium  Memory: Immediate Fair; Recent Fair; Remote Fair  Judgment: Limited Insight: Limited Executive Functions  Concentration: Good  Attention Span: Good  Recall: Fair  Fund of Knowledge: -- (Limited)  Language: Fair  Psychomotor Activity  Psychomotor  Activity: Psychomotor Activity: Normal  Assets  Assets: Communication Skills; Desire for Improvement; Financial Resources/Insurance; Resilience  Sleep  Sleep: Sleep: Good Number of Hours of Sleep: 8  Physical Exam: Physical Exam Vitals and nursing note reviewed.  Constitutional:      General: She is not in acute distress.    Appearance: She is obese. She is not ill-appearing or diaphoretic.  HENT:     Head: Normocephalic.     Comments: Intellectual disability    Nose: No congestion.     Mouth/Throat:     Pharynx: Oropharynx is clear.  Eyes:     Pupils: Pupils are equal, round, and reactive to light.  Cardiovascular:     Rate and Rhythm: Normal rate.     Pulses: Normal pulses.  Pulmonary:     Effort: Pulmonary effort is normal. No respiratory distress.  Abdominal:     Comments: Deferred  Genitourinary:    Comments: Deferred Musculoskeletal:        General: Normal range of motion.     Cervical back: Normal range of motion.  Skin:    General: Skin is warm and dry.  Neurological:     General: No focal deficit present.     Mental Status: She is alert.  Psychiatric:     Comments: Mood is labile and sad.    Blood pressure 100/69, pulse (!) 59, temperature 98.1 F (36.7 C), temperature source Oral, resp. rate 18, height 4\' 11"  (1.499 m), weight 63 kg, SpO2 100%. Body mass index is 28.05 kg/m.  Treatment Plan Summary: Daily contact with patient to assess and evaluate symptoms and progress in treatment and Medication management.   Still waiting on safe disposition as patient would not be  able to live independently given cognitive impairment. Recommend assisted living.   No medication side effects.  Because of urinary incontinence, recommended adult diapers to be worn.  Principal/active diagnoses:  Principal Problem:   Schizoaffective disorder, depressive type (HCC) Active Problems:   GERD (gastroesophageal reflux disease)   Intellectual disability   Tobacco use  disorder (MOCA 16/30) moderate cognitive impairment probably related to moderate intellectual disability.  Plan:  -Continue Sertraline150 mg po Q daily for depression/anxiety -Continue Buspar 15 mg po bid for anxiety.  -Continue paliperidone 6 mg po qd for mood stabilization -Continue Trazodone 50 mg nightly for insomnia  -Continue Nicoderm 21 mg topically Q 24 hrs for nicotine withdrawal management. -Continue vitamin B12 1000 mcg IM weekly for 4 weeks then monthly -Continue Melatonin 5 mg nightly for sleep -Continue Protonix EC 40 mg p.o. daily for GERD -Continue MiraLAX 17 g p.o. PRN for constipation -Continue hydroxyzine 25 mg p.o. 3 times daily as needed for anxiety -Continue Ensure nutritional shakes TID in between meals  -Previously discontinued Hydroxyzine 50 mg nightly-hypotension in the mornings  Lab work and EKG, reviewed with no significant abnormalities noted, except slightly low b12 166.  01/05/2023, started on B12 1000 mcg injections q. weekly x 4 weeks, then q. monthly.  Safety and Monitoring: Voluntary admission to inpatient psychiatric unit for safety, stabilization and treatment Daily contact with patient to assess and evaluate symptoms and progress in treatment Patient's case to be discussed in multi-disciplinary team meeting Observation Level : q15 minute checks Vital signs: q12 hours Precautions: Safety   Discharge Planning: Social work and case management to assist with discharge planning and identification of hospital follow-up needs prior to discharge Estimated LOS: Unknown at this time. Discharge Concerns: Need to establish a safety plan; Medication compliance and effectiveness Discharge Goals: Return home with outpatient referrals for mental health follow-up including medication management/psychotherapy No legal guardian, has payee  Armandina Stammer, NP. 02/01/2023, 3:30 PM Patient ID: Larey Brick, female   DOB: 1976/01/20, 47 y.o.    MRN:  [id number] Patient ID: TITA HILLIER, female   DOB: [birth year], 47 y.o.   MRN: [id number] Patient ID: WANZA AYBAR, female   DOB: 1975/10/03, 47 y.o.   MRN: [id number] Patient ID: VEIDA RINGUETTE, female   DOB: 09-15-1975, 47 y.o.   MRN: [id number]

## 2023-02-01 NOTE — BHH Group Notes (Signed)
Adult Psychoeducational Group Note  Date:  02/01/2023 Time:  10:13 AM  Group Topic/Focus:  Goals Group:   The focus of this group is to help patients establish daily goals to achieve during treatment and discuss how the patient can incorporate goal setting into their daily lives to aide in recovery. Orientation:   The focus of this group is to educate the patient on the purpose and policies of crisis stabilization and provide a format to answer questions about their admission.  The group details unit policies and expectations of patients while admitted.  Participation Level:  Did Not Attend  Participation Quality:    Affect:    Cognitive:    Insight:   Engagement in Group:    Modes of Intervention:    Additional Comments:    Sheran Lawless 02/01/2023, 10:13 AM

## 2023-02-01 NOTE — Plan of Care (Signed)
  Problem: Coping: Goal: Coping ability will improve Outcome: Progressing Goal: Will verbalize feelings Outcome: Progressing   Problem: Health Behavior/Discharge Planning: Goal: Ability to make decisions will improve Outcome: Progressing Goal: Compliance with therapeutic regimen will improve Outcome: Progressing   Problem: Role Relationship: Goal: Will demonstrate positive changes in social behaviors and relationships Outcome: Progressing   Problem: Safety: Goal: Ability to disclose and discuss suicidal ideas will improve Outcome: Progressing Goal: Ability to identify and utilize support systems that promote safety will improve Outcome: Progressing   Problem: Self-Concept: Goal: Will verbalize positive feelings about self Outcome: Progressing Goal: Level of anxiety will decrease Outcome: Progressing   Problem: Education: Goal: Utilization of techniques to improve thought processes will improve Outcome: Progressing Goal: Knowledge of the prescribed therapeutic regimen will improve Outcome: Progressing

## 2023-02-01 NOTE — Progress Notes (Signed)
   02/01/23 0615  15 Minute Checks  Location Bedroom  Visual Appearance Calm  Behavior Sleeping  Sleep (Behavioral Health Patients Only)  Calculate sleep? (Click Yes once per 24 hr at 0600 safety check) Yes  Documented sleep last 24 hours 7

## 2023-02-01 NOTE — Plan of Care (Signed)
  Problem: Activity: Goal: Imbalance in normal sleep/wake cycle will improve Outcome: Progressing   Problem: Coping: Goal: Coping ability will improve Outcome: Progressing Goal: Will verbalize feelings Outcome: Progressing   Problem: Health Behavior/Discharge Planning: Goal: Compliance with therapeutic regimen will improve Outcome: Progressing

## 2023-02-01 NOTE — Plan of Care (Signed)
  Problem: Coping: Goal: Coping ability will improve Outcome: Progressing Goal: Will verbalize feelings Outcome: Progressing   Problem: Health Behavior/Discharge Planning: Goal: Ability to make decisions will improve Outcome: Progressing Goal: Compliance with therapeutic regimen will improve Outcome: Progressing   Problem: Role Relationship: Goal: Will demonstrate positive changes in social behaviors and relationships Outcome: Progressing   Problem: Safety: Goal: Ability to disclose and discuss suicidal ideas will improve Outcome: Progressing Goal: Ability to identify and utilize support systems that promote safety will improve Outcome: Progressing   Problem: Self-Concept: Goal: Will verbalize positive feelings about self Outcome: Progressing Goal: Level of anxiety will decrease Outcome: Progressing

## 2023-02-01 NOTE — BHH Group Notes (Signed)
BHH Group Notes:  (Nursing/MHT/Case Management/Adjunct)  Date:  02/01/2023  Time:  10:56 PM  Type of Therapy:   AA Group  Participation Level:  Active  Participation Quality:  Appropriate  Affect:  Appropriate  Cognitive:  Appropriate  Insight:  Appropriate  Engagement in Group:  Supportive  Modes of Intervention:  Support  Summary of Progress/Problems:Pt attended AA meeting.   Granville Lewis 02/01/2023, 10:56 PM

## 2023-02-01 NOTE — Progress Notes (Signed)
   02/01/23 1959  Psych Admission Type (Psych Patients Only)  Admission Status Voluntary  Psychosocial Assessment  Patient Complaints None  Eye Contact Brief  Facial Expression Animated  Affect Appropriate to circumstance  Speech Soft;Slow  Interaction Childlike  Motor Activity Slow  Appearance/Hygiene Unremarkable  Behavior Characteristics Cooperative;Appropriate to situation  Mood Pleasant  Thought Process  Coherency WDL  Content WDL  Delusions None reported or observed  Perception WDL  Hallucination None reported or observed  Judgment Poor  Confusion None  Danger to Self  Current suicidal ideation? Denies  Agreement Not to Harm Self Yes  Description of Agreement verbal  Danger to Others  Danger to Others None reported or observed

## 2023-02-01 NOTE — Progress Notes (Signed)
   01/31/23 2022  Psych Admission Type (Psych Patients Only)  Admission Status Voluntary  Psychosocial Assessment  Patient Complaints None  Eye Contact Brief  Facial Expression Animated  Affect Sad  Speech Soft;Slow  Motor Activity Slow  Appearance/Hygiene Disheveled  Behavior Characteristics Cooperative;Appropriate to situation  Mood Pleasant;Sad  Thought Process  Coherency WDL  Content WDL  Delusions None reported or observed  Perception WDL  Hallucination None reported or observed  Judgment Poor  Confusion None  Danger to Self  Current suicidal ideation? Denies  Agreement Not to Harm Self Yes  Description of Agreement verbal  Danger to Others  Danger to Others None reported or observed

## 2023-02-01 NOTE — BH IP Treatment Plan (Signed)
Interdisciplinary Treatment and Diagnostic Plan Update  02/01/2023 Time of Session: 1:36pm Yolanda [name] MRN: [id number]  Principal Diagnosis: Schizoaffective disorder, depressive type (HCC)  Secondary Diagnoses: Principal Problem:   Schizoaffective disorder, depressive type (HCC) Active Problems:   GERD (gastroesophageal reflux disease)   Intellectual disability   Tobacco use disorder   Current Medications:  Current Facility-Administered Medications  Medication Dose Route Frequency Provider Last Rate Last Admin   acetaminophen (TYLENOL) tablet 650 mg  650 mg Oral Q6H PRN [name], NP   650 mg at 01/19/23 1855   alum & mag hydroxide-simeth (MAALOX/MYLANTA) 200-200-20 MG/5ML suspension 30 mL  30 mL Oral Q4H PRN [name], NP   30 mL at 01/28/23 1530   busPIRone (BUSPAR) tablet 15 mg  15 mg Oral BID [name], NP   15 mg at 02/01/23 0840   [START ON 02/02/2023] cyanocobalamin (VITAMIN B12) injection 1,000 mcg  1,000 mcg Intramuscular Q30 days Sarita Bottom, MD       diphenhydrAMINE (BENADRYL) capsule 50 mg  50 mg Oral TID PRN [name], NP   50 mg at 01/15/23 1211   Or   diphenhydrAMINE (BENADRYL) injection 50 mg  50 mg Intramuscular TID PRN [name], NP       feeding supplement (ENSURE ENLIVE / ENSURE PLUS) liquid 237 mL  237 mL Oral TID BM Nkwenti, Doris, NP   237 mL at 02/01/23 1029   haloperidol (HALDOL) tablet 5 mg  5 mg Oral TID PRN [name], NP   5 mg at 01/15/23 1211   Or   haloperidol lactate (HALDOL) injection 5 mg  5 mg Intramuscular TID PRN [name], NP       hydrOXYzine (ATARAX) tablet 25 mg  25 mg Oral TID PRN [name], DO   25 mg at 01/31/23 2112   LORazepam (ATIVAN) tablet 2 mg  2 mg Oral TID PRN [name], NP   2 mg at 01/15/23 1211   Or   LORazepam (ATIVAN) injection 2 mg  2 mg Intramuscular TID PRN [name], NP       magnesium hydroxide (MILK OF MAGNESIA) suspension 30 mL  30 mL Oral Daily PRN [name], NP        melatonin tablet 5 mg  5 mg Oral QHS Nkwenti, Doris, NP   5 mg at 01/31/23 2111   nicotine (NICODERM CQ - dosed in mg/24 hours) patch 21 mg  21 mg Transdermal Daily [name], DO   21 mg at 01/30/23 0801   paliperidone (INVEGA) 24 hr tablet 6 mg  6 mg Oral Daily [name], NP   6 mg at 01/31/23 2111   pantoprazole (PROTONIX) EC tablet 40 mg  40 mg Oral Daily [name], NP   40 mg at 02/01/23 0840   polyethylene glycol (MIRALAX / GLYCOLAX) packet 17 g  17 g Oral Daily PRN [name], NP       sertraline (ZOLOFT) tablet 150 mg  150 mg Oral Daily Massengill, Nathan, MD   150 mg at 02/01/23 0840   traZODone (DESYREL) tablet 50 mg  50 mg Oral QHS [name], NP   50 mg at 01/31/23 2111   PTA Medications: Medications Prior to Admission  Medication Sig Dispense Refill Last Dose   busPIRone (BUSPAR) 15 MG tablet Take 15 mg by mouth 2 (two) times daily. (Patient not taking: Reported on 12/19/2022)      paliperidone (INVEGA SUSTENNA) 156 MG/ML SUSY injection Inject  156 mg into the muscle once. (Patient not taking: Reported on 12/19/2022)      sertraline (ZOLOFT) 50 MG tablet Take 150 mg by mouth daily. (Patient not taking: Reported on 12/19/2022)      traZODone (DESYREL) 100 MG tablet Take 100 mg by mouth at bedtime as needed for sleep. (Patient not taking: Reported on 11/12/2022)       Patient Stressors: Medication change or noncompliance    Patient Strengths: Forensic psychologist fund of knowledge   Treatment Modalities: Medication Management, Group therapy, Case management,  1 to 1 session with clinician, Psychoeducation, Recreational therapy.   Physician Treatment Plan for Primary Diagnosis: Schizoaffective disorder, depressive type (HCC) Long Term Goal(s): Improvement in symptoms so as ready for discharge   Short Term Goals: Ability to identify and develop effective coping behaviors will improve Ability to maintain clinical measurements within normal limits will  improve Compliance with prescribed medications will improve Ability to identify triggers associated with substance abuse/mental health issues will improve Ability to identify changes in lifestyle to reduce recurrence of condition will improve Ability to verbalize feelings will improve Ability to disclose and discuss suicidal ideas Ability to demonstrate self-control will improve  Medication Management: Evaluate patient's response, side effects, and tolerance of medication regimen.  Therapeutic Interventions: 1 to 1 sessions, Unit Group sessions and Medication administration.  Evaluation of Outcomes: Progressing  Physician Treatment Plan for Secondary Diagnosis: Principal Problem:   Schizoaffective disorder, depressive type (HCC) Active Problems:   GERD (gastroesophageal reflux disease)   Intellectual disability   Tobacco use disorder  Long Term Goal(s): Improvement in symptoms so as ready for discharge   Short Term Goals: Ability to identify and develop effective coping behaviors will improve Ability to maintain clinical measurements within normal limits will improve Compliance with prescribed medications will improve Ability to identify triggers associated with substance abuse/mental health issues will improve Ability to identify changes in lifestyle to reduce recurrence of condition will improve Ability to verbalize feelings will improve Ability to disclose and discuss suicidal ideas Ability to demonstrate self-control will improve     Medication Management: Evaluate patient's response, side effects, and tolerance of medication regimen.  Therapeutic Interventions: 1 to 1 sessions, Unit Group sessions and Medication administration.  Evaluation of Outcomes: Progressing   RN Treatment Plan for Primary Diagnosis: Schizoaffective disorder, depressive type (HCC) Long Term Goal(s): Knowledge of disease and therapeutic regimen to maintain health will improve  Short Term Goals:  Ability to remain free from injury will improve, Ability to verbalize frustration and anger appropriately will improve, Ability to demonstrate self-control, Ability to participate in decision making will improve, Ability to verbalize feelings will improve, Ability to disclose and discuss suicidal ideas, and Compliance with prescribed medications will improve  Medication Management: RN will administer medications as ordered by provider, will assess and evaluate patient's response and provide education to patient for prescribed medication. RN will report any adverse and/or side effects to prescribing provider.  Therapeutic Interventions: 1 on 1 counseling sessions, Psychoeducation, Medication administration, Evaluate responses to treatment, Monitor vital signs and CBGs as ordered, Perform/monitor CIWA, COWS, AIMS and Fall Risk screenings as ordered, Perform wound care treatments as ordered.  Evaluation of Outcomes: Progressing   LCSW Treatment Plan for Primary Diagnosis: Schizoaffective disorder, depressive type (HCC) Long Term Goal(s): Safe transition to appropriate next level of care at discharge, Engage patient in therapeutic group addressing interpersonal concerns.  Short Term Goals: Engage patient in aftercare planning with referrals and resources, Increase social support,  Increase ability to appropriately verbalize feelings, Increase emotional regulation, Facilitate acceptance of mental health diagnosis and concerns, Facilitate patient progression through stages of change regarding substance use diagnoses and concerns, Identify triggers associated with mental health/substance abuse issues, and Increase skills for wellness and recovery  Therapeutic Interventions: Assess for all discharge needs, 1 to 1 time with Social worker, Explore available resources and support systems, Assess for adequacy in community support network, Educate family and significant other(s) on suicide prevention, Complete  Psychosocial Assessment, Interpersonal group therapy.  Evaluation of Outcomes: Progressing   Progress in Treatment: Attending groups: No. Participating in groups: No. Taking medication as prescribed: Yes. Toleration medication: No. Family/Significant other contact made: No, will contact:  declined consents Patient understands diagnosis: Yes. Discussing patient identified problems/goals with staff: Yes. Medical problems stabilized or resolved: Yes. Denies suicidal/homicidal ideation: Yes. Issues/concerns per patient self-inventory: No. Other: none reported  New problem(s) identified: No, Describe:  none reported  New Short Term/Long Term Goal(s): medication stabilization, elimination of SI thoughts, development of comprehensive mental wellness plan.    Patient Goals:  coping skills  Discharge Plan or Barriers: Patient recently admitted. CSW will continue to follow and assess for appropriate referrals and possible discharge planning.       Reason for Continuation of Hospitalization: Depression Medication stabilization Suicidal ideation  Estimated Length of Stay: 3-7 days  Last 3 [place] Suicide Severity Risk Score: Flowsheet Row Ad[facility] (Current) [facility] from 12[facility]19/2022 in [facility] ED from 11/12/2022 in [facility] Department at [facility]  C-SSRS RISK CATEGORY High Risk High Risk Moderate Risk       Last [facility] 2/9 Scores:     No data to display          Scribe for Treatment Team: Charise Killian 02/01/2023 1:36 PM

## 2023-02-01 NOTE — Group Note (Signed)
Recreation Therapy Group Note   Group Topic:Team Building  Group Date: 02/01/2023 Start Time: 0930 End Time: 1000 Facilitators: Travanti Mcmanus-McCall, LRT,CTRS Location: [address] Dayroom   Goal Area(s) Addresses:  Patient will effectively work with peer towards shared goal.  Patient will identify skills used to make activity successful.  Patient will identify how skills used during activity can be applied to reach post d/c goals.   Group Description: [employer]. In teams of 5-6, patients were given 11 craft pipe cleaners. Using the materials provided, patients were instructed to compete again the opposing team(s) to build the tallest free-standing structure from floor level. The activity was timed; difficulty increased by Clinical research associate as Production designer, theatre/television/film continued.  Systematically resources were removed with additional directions for example, placing one arm behind their back, working in silence, and shape stipulations. LRT facilitated post-activity discussion reviewing team processes and necessary communication skills involved in completion. Patients were encouraged to reflect how the skills utilized, or not utilized, in this activity can be incorporated to positively impact support systems post discharge.   Affect/Mood: N/A   Participation Level: Did not attend    Clinical Observations/Individualized Feedback:    Plan: Continue to engage patient in RT group sessions 2-3x/week.   Bexleigh Theriault-McCall, LRT,CTRS 02/01/2023 12:58 PM

## 2023-02-02 DIAGNOSIS — F251 Schizoaffective disorder, depressive type: Secondary | ICD-10-CM | POA: Diagnosis not present

## 2023-02-02 NOTE — Progress Notes (Signed)
   02/02/23 2010  Psych Admission Type (Psych Patients Only)  Admission Status Voluntary  Psychosocial Assessment  Patient Complaints Depression;Sadness  Eye Contact Brief  Facial Expression Animated  Affect Appropriate to circumstance  Speech Soft;Slow  Interaction Childlike  Motor Activity Slow  Appearance/Hygiene Unremarkable  Behavior Characteristics Cooperative;Appropriate to situation  Mood Sad  Thought Process  Coherency WDL  Content WDL  Delusions None reported or observed  Perception WDL  Hallucination None reported or observed  Judgment Poor  Confusion None  Danger to Self  Current suicidal ideation? Denies  Agreement Not to Harm Self Yes  Description of Agreement verbal  Danger to Others  Danger to Others None reported or observed

## 2023-02-02 NOTE — Progress Notes (Signed)
   02/02/23 0646  15 Minute Checks  Location Bedroom  Visual Appearance Calm  Behavior Composed  Sleep (Behavioral Health Patients Only)  Calculate sleep? (Click Yes once per 24 hr at 0600 safety check) Yes  Documented sleep last 24 hours 8.75

## 2023-02-02 NOTE — [facility] Group Notes (Signed)
[facility] Group Notes:  (Nursing/MHT/Case Management/Adjunct)  Date:  02/02/2023  Time:  9:15 PM  Type of Therapy:   Wrap-up group  Participation Level:  Active  Participation Quality:  Appropriate  Affect:  Appropriate  Cognitive:  Appropriate  Insight:  Appropriate  Engagement in Group:  Engaged  Modes of Intervention:  Education  Summary of Progress/Problems: Pt goal to speak with family. Pt reports she did speak with her daughter. Day 6/10.  Noah Delaine 02/02/2023, 9:15 PM

## 2023-02-02 NOTE — Progress Notes (Signed)
PRN hydroxyzine administered at 1857 for tearfulness and "racing thoughts." Patient given encouragement, snacks and was able to return to reading.

## 2023-02-02 NOTE — Plan of Care (Signed)
  Problem: Activity: Goal: Interest or engagement in leisure activities will improve Outcome: Progressing Goal: Imbalance in normal sleep/wake cycle will improve Outcome: Progressing   Problem: Coping: Goal: Will verbalize feelings Outcome: Progressing   Problem: Health Behavior/Discharge Planning: Goal: Ability to make decisions will improve Outcome: Progressing Goal: Compliance with therapeutic regimen will improve Outcome: Progressing

## 2023-02-02 NOTE — [facility] Group Notes (Signed)
[facility] Group Notes:  (Nursing/MHT/Case Management/Adjunct)  Date:  02/02/2023  Time:  2:07 PM  Type of Therapy:  Psychoeducational Skills  Participation Level:  Did Not Attend  Participation Quality:   na  Affect:   na  Cognitive:   na  Insight:  None  Engagement in Group:   na  Modes of Intervention:   na  Summary of Progress/Problems: A podcast from mental health professional Mel Sherral Hammers '' how to help your anxiety '' was played during group. Pt did not attend.  Yolanda [name] 02/02/2023, 2:07 PM

## 2023-02-02 NOTE — [facility] Group Notes (Signed)
Pt did not attend am Goals/Community meeting group.

## 2023-02-02 NOTE — Progress Notes (Signed)
   02/02/23 0900  Psych Admission Type (Psych Patients Only)  Admission Status Voluntary  Psychosocial Assessment  Patient Complaints None  Eye Contact Brief  Facial Expression Sad  Affect Appropriate to circumstance;Sad  Speech Soft;Slow;Logical/coherent  Interaction Childlike  Motor Activity Slow;Tremors  Appearance/Hygiene Poor hygiene  Behavior Characteristics Cooperative;Appropriate to situation  Mood Sad  Thought Process  Coherency WDL  Content WDL  Delusions None reported or observed  Perception WDL  Hallucination None reported or observed  Judgment Poor  Confusion None  Danger to Self  Current suicidal ideation? Denies  Agreement Not to Harm Self Yes  Description of Agreement verbal  Danger to Others  Danger to Others None reported or observed

## 2023-02-02 NOTE — Progress Notes (Signed)
Patient ID: Yolanda [name], female   DOB: 1975/08/07, 47 y.o.   MRN: [id number] [facility] MD Progress Note  02/02/2023 12:58 PM Yolanda [name]  MRN:  [id number]  Reason for admission: This is the first psychiatric admission in this [facility] in 12 years for this 1 AA female with an extensive hx of mental illnesses & probable polysubstance use disorders. She is admitted to the [facility] from the [facility] with complain of worsening suicidal ideations with plan to stab herself. Per chart review, patient apparently reported at the ED that she has been depressed for a while & has not been taking her mental health medications. After medical evaluation.clearance, she was transferred to the [facility] for further psychiatric evaluation/treatments.   APS report on 12/31/22 Has payee, no legal guardian Continues to await placement.  24 hr chart review: Sleep Hours last night: Good Nursing Concerns: Isolative to room & room lock out being implemented Behavioral episodes in the past 24 hrs: As above Medication Compliance: Compliant Vital Signs in the past 24 hrs: WNL PRN Medications in the past 24 hrs: None  Daily notes note: Yolanda [name] is seen in her room. Chart reviewed. The chart findings discussed with the treatment team. She presents with an improved mood as she reports, "My mood is good today". Her affect is good/reactive. She says she slept well last night. Had a good breakfast. She is taking & tolerating her treatment regimen. The [facility] treatment team continues to treat & maintain patient on a stable mood as her main need at this time is housing. The DSS are currently working to find placement for patient, likely in a group home setting. Until that happens, we will continue to work with patient & encouraged her to continue to take her medications & remain active in the group sessions. She currently denies any SIHI, AVH, delusional thoughts or paranoia. She does not appear to be responding to any internal stimuli.  Discussed this case with the attending psychiatrist. There are no changes made on her current plan of care. Continue as already in progress.  Principal Problem: Schizoaffective disorder, depressive type (HCC)   Diagnosis: Principal Problem:   Schizoaffective disorder, depressive type (HCC) Active Problems:   GERD (gastroesophageal reflux disease)   Intellectual disability   Tobacco use disorder  Past Psychiatric History: Patient is not forth coming with the answers to the assessment questions. She is refusing to provide information about her mental health hx or symptoms.   Past Medical History:  Past Medical History:  Diagnosis Date   Bipolar affect, depressed (HCC)    Constipation 08/17/2022   Depression    Falls 07/21/2022   Fracture of femoral neck, right, closed (HCC) 01/17/2022   Herpes simplex 08/22/2017   Open fracture dislocation of right elbow joint 01/17/2022    Past Surgical History:  Procedure Laterality Date   NO PAST SURGERIES     SALPINGECTOMY     Family History: History reviewed. No pertinent family history.  Family Psychiatric  History: See H&P.  Social History:  Social History   Substance and Sexual Activity  Alcohol Use Yes     Social History   Substance and Sexual Activity  Drug Use Yes   Types: Cocaine, Marijuana    Social History   Socioeconomic History   Marital status: Single    Spouse name: Not on file   Number of children: Not on file   Years of education: Not on file   Highest education level: Not on file  Occupational History   Not on file  Tobacco Use   Smoking status: Every Day   Smokeless tobacco: Not on file  Substance and Sexual Activity   Alcohol use: Yes   Drug use: Yes    Types: Cocaine, Marijuana   Sexual activity: Yes  Other Topics Concern   Not on file  Social History Narrative   Not on file   Social Determinants of Health   Financial Resource Strain: Low Risk  (09/12/2022)   Received from [facility],  [facility]   Overall Financial Resource Strain (CARDIA)    Difficulty of Paying Living Expenses: Not hard at all  Food Insecurity: Patient Declined (12/20/2022)   Hunger Vital Sign    Worried About Running Out of Food in the Last Year: Patient declined    Ran Out of Food in the Last Year: Patient declined  Transportation Needs: No Transportation Needs (12/20/2022)   PRAPARE - Administrator, Civil Service (Medical): No    Lack of Transportation (Non-Medical): No  Physical Activity: Not on file  Stress: No Stress Concern Present (07/17/2022)   Received from [facility], [facility] - Occupational Stress Questionnaire    Feeling of Stress : Not at all  Social Connections: Unknown (07/16/2022)   Received from [facility], [facility]   Social Network    Social Network: Not on file   Current Medications: Current Facility-Administered Medications  Medication Dose Route Frequency Provider Last Rate Last Admin   acetaminophen (TYLENOL) tablet 650 mg  650 mg Oral Q6H PRN [name], NP   650 mg at 01/19/23 1855   alum & mag hydroxide-simeth (MAALOX/MYLANTA) 200-200-20 MG/5ML suspension 30 mL  30 mL Oral Q4H PRN [name], NP   30 mL at 01/28/23 1530   busPIRone (BUSPAR) tablet 15 mg  15 mg Oral BID [name], NP   15 mg at 02/02/23 0810   cyanocobalamin (VITAMIN B12) injection 1,000 mcg  1,000 mcg Intramuscular Q30 days [name], Nadir, MD   1,000 mcg at 02/02/23 1238   diphenhydrAMINE (BENADRYL) capsule 50 mg  50 mg Oral TID PRN [name], NP   50 mg at 01/15/23 1211   Or   diphenhydrAMINE (BENADRYL) injection 50 mg  50 mg Intramuscular TID PRN [name], NP       feeding supplement (ENSURE ENLIVE / ENSURE PLUS) liquid 237 mL  237 mL Oral TID BM Nkwenti, Doris, NP   237 mL at 02/02/23 1049   haloperidol (HALDOL) tablet 5 mg  5 mg Oral TID PRN [name], NP   5 mg at 01/15/23 1211   Or   haloperidol lactate  (HALDOL) injection 5 mg  5 mg Intramuscular TID PRN [name], NP       hydrOXYzine (ATARAX) tablet 25 mg  25 mg Oral TID PRN [name], DO   25 mg at 01/31/23 2112   LORazepam (ATIVAN) tablet 2 mg  2 mg Oral TID PRN [name], NP   2 mg at 01/15/23 1211   Or   LORazepam (ATIVAN) injection 2 mg  2 mg Intramuscular TID PRN [name], NP       magnesium hydroxide (MILK OF MAGNESIA) suspension 30 mL  30 mL Oral Daily PRN [name], NP       melatonin tablet 5 mg  5 mg Oral QHS [name], NP   5 mg at 02/01/23 2116   nicotine (NICODERM  CQ - dosed in mg/24 hours) patch 21 mg  21 mg Transdermal Daily [name], DO   21 mg at 01/30/23 0801   paliperidone (INVEGA) 24 hr tablet 6 mg  6 mg Oral Daily [name], NP   6 mg at 02/01/23 2116   pantoprazole (PROTONIX) EC tablet 40 mg  40 mg Oral Daily [name], NP   40 mg at 02/02/23 0809   polyethylene glycol (MIRALAX / GLYCOLAX) packet 17 g  17 g Oral Daily PRN [name], NP       sertraline (ZOLOFT) tablet 150 mg  150 mg Oral Daily Massengill, Harrold Donath, MD   150 mg at 02/02/23 0810   traZODone (DESYREL) tablet 50 mg  50 mg Oral QHS [name], NP   50 mg at 02/01/23 2116   Lab Results:  No results found for this or any previous visit (from the past 48 hour(s)).   Blood Alcohol level:  Lab Results  Component Value Date   ETH <10 12/19/2022   ETH <10 11/21/2019   Metabolic Disorder Labs: Lab Results  Component Value Date   HGBA1C 4.4 (L) 12/21/2022   MPG 79.58 12/21/2022   No results found for: "PROLACTIN" Lab Results  Component Value Date   CHOL 218 (H) 12/21/2022   TRIG 81 12/21/2022   HDL 53 12/21/2022   CHOLHDL 4.1 12/21/2022   VLDL 16 12/21/2022   LDLCALC 149 (H) 12/21/2022   LDLCALC 110 (H) 03/13/2011   Physical Findings: AIMS: Facial and Oral Movements Muscles of Facial Expression: None, normal Lips and Perioral Area: None, normal Jaw: None, normal Tongue: None, normal,Extremity  Movements Upper (arms, wrists, hands, fingers): None, normal Lower (legs, knees, ankles, toes): None, normal, Trunk Movements Neck, shoulders, hips: None, normal, Overall Severity Severity of abnormal movements (highest score from questions above): None, normal Incapacitation due to abnormal movements: None, normal Patient's awareness of abnormal movements (rate only patient's report): No Awareness, Dental Status Current problems with teeth and/or dentures?: No Does patient usually wear dentures?: No   Musculoskeletal: Strength & Muscle Tone: within normal limits Gait & Station: normal Patient leans: N/A  Psychiatric Specialty Exam:  Presentation  General Appearance:  Lying down in bed, better hygiene noted  Eye Contact: Fair Speech: -- (Slow, slurred at times.)  Speech Volume: Decreased  Handedness: Right  Mood and Affect  Mood: -- (improving)  Affect: Congruent  Thought Process  Thought Processes: Goal Directed  Descriptions of Associations:Intact  Orientation:Full (Time, Place and Person)  Thought Content: Denies SI HI or AVH does not present responding to stimuli. Presentation consistent with moderate intellectual disability  History of Schizophrenia/Schizoaffective disorder:Yes  Duration of Psychotic Symptoms:Greater than six months  Hallucinations:Hallucinations: None Description of Auditory Hallucinations: NA    Ideas of Reference:None  Suicidal Thoughts:Suicidal Thoughts: No SI Active Intent and/or Plan: Without Intent; Without Plan; Without Means to Carry Out; Without Access to Means SI Passive Intent and/or Plan: Without Intent; Without Plan; Without Means to Carry Out; Without Access to Means    Homicidal Thoughts:Homicidal Thoughts: No    Sensorium  Memory: Immediate Fair; Recent Fair; Remote Fair  Judgment: Limited Insight: Limited Executive Functions  Concentration: Fair  Attention Span: Fair  Recall: [employer] of  Knowledge: Fair  Language: Fair  Psychomotor Activity  Psychomotor Activity: Psychomotor Activity: Decreased   Assets  Assets: Communication Skills; Desire for Improvement; Financial Resources/Insurance; Resilience; Social Support  Sleep  Sleep: Sleep: Good Number of Hours of Sleep: 8.5  Physical Exam: Physical Exam Vitals and nursing note reviewed.  Constitutional:      General: She is not in acute distress.    Appearance: She is obese. She is not ill-appearing or diaphoretic.  HENT:     Head: Normocephalic.     Comments: Intellectual disability    Nose: No congestion.     Mouth/Throat:     Pharynx: Oropharynx is clear.  Eyes:     Pupils: Pupils are equal, round, and reactive to light.  Cardiovascular:     Rate and Rhythm: Normal rate.     Pulses: Normal pulses.  Pulmonary:     Effort: Pulmonary effort is normal. No respiratory distress.  Abdominal:     Comments: Deferred  Genitourinary:    Comments: Deferred Musculoskeletal:        General: Normal range of motion.     Cervical back: Normal range of motion.  Skin:    General: Skin is warm and dry.  Neurological:     General: No focal deficit present.     Mental Status: She is alert.  Psychiatric:     Comments: Mood is labile and sad.    Blood pressure (!) 94/59, pulse 60, temperature 98.1 F (36.7 C), temperature source Oral, resp. rate 18, height 4\' 11"  (1.499 m), weight 63 kg, SpO2 99%. Body mass index is 28.05 kg/m.  Treatment Plan Summary: Daily contact with patient to assess and evaluate symptoms and progress in treatment and Medication management.   Still waiting on safe disposition as patient would not be able to live independently given cognitive impairment. Recommend assisted living.   No medication side effects.  Because of urinary incontinence, recommended adult diapers to be worn.  Principal/active diagnoses:  Principal Problem:   Schizoaffective disorder, depressive type  (HCC) Active Problems:   GERD (gastroesophageal reflux disease)   Intellectual disability   Tobacco use disorder (MOCA 16/30) moderate cognitive impairment probably related to moderate intellectual disability.  Plan:  -Continue Sertraline150 mg po Q daily for depression/anxiety -Continue Buspar 15 mg po bid for anxiety.  -Continue paliperidone 6 mg po qd for mood stabilization -Continue Trazodone 50 mg nightly for insomnia  -Continue Nicoderm 21 mg topically Q 24 hrs for nicotine withdrawal management. -Continue vitamin B12 1000 mcg IM weekly for 4 weeks then monthly -Continue Melatonin 5 mg nightly for sleep -Continue Protonix EC 40 mg p.o. daily for GERD -Continue MiraLAX 17 g p.o. PRN for constipation -Continue hydroxyzine 25 mg p.o. 3 times daily as needed for anxiety -Continue Ensure nutritional shakes TID in between meals  -Previously discontinued Hydroxyzine 50 mg nightly-hypotension in the mornings  Lab work and EKG, reviewed with no significant abnormalities noted, except slightly low b12 166.  01/05/2023, started on B12 1000 mcg injections q. weekly x 4 weeks, then q. monthly.  Safety and Monitoring: Voluntary admission to inpatient psychiatric unit for safety, stabilization and treatment Daily contact with patient to assess and evaluate symptoms and progress in treatment Patient's case to be discussed in multi-disciplinary team meeting Observation Level : q15 minute checks Vital signs: q12 hours Precautions: Safety   Discharge Planning: Social work and case management to assist with discharge planning and identification of hospital follow-up needs prior to discharge Estimated LOS: Unknown at this time. Discharge Concerns: Need to establish a safety plan; Medication compliance and effectiveness Discharge Goals: Return home with outpatient referrals for mental health follow-up including medication management/psychotherapy No legal guardian, has payee  Armandina Stammer,  NP. 02/02/2023, 12:58 PM Patient  ID: Dalayna Oneal Grout, female   DOB: 03/29/1976, 47 y.o.    MRN: [id number] Patient ID: BIDDIE FASSBENDER, female   DOB: July 04, 1976, 47 y.o.   MRN: [id number] Patient ID: SOMMER MELDE, female   DOB: October 16, 1975, 47 y.o.   MRN: [id number] Patient ID: ALSION MALLEY, female   DOB: Nov 11, 1975, 47 y.o.   MRN: [id number] Patient ID: MAKINNLEY CHOUDHURY, female   DOB: 1976/01/10, 47 y.o.   MRN: [id number]

## 2023-02-02 NOTE — Plan of Care (Signed)
  Problem: Education: Goal: Utilization of techniques to improve thought processes will improve Outcome: Progressing Goal: Knowledge of the prescribed therapeutic regimen will improve Outcome: Progressing   Problem: Activity: Goal: Interest or engagement in leisure activities will improve Outcome: Progressing   Problem: Coping: Goal: Coping ability will improve Outcome: Progressing Goal: Will verbalize feelings Outcome: Progressing   Problem: Health Behavior/Discharge Planning: Goal: Ability to make decisions will improve Outcome: Progressing

## 2023-02-03 DIAGNOSIS — F251 Schizoaffective disorder, depressive type: Secondary | ICD-10-CM | POA: Diagnosis not present

## 2023-02-03 NOTE — Progress Notes (Signed)
   02/03/23 0749  Psych Admission Type (Psych Patients Only)  Admission Status Voluntary  Psychosocial Assessment  Patient Complaints None  Eye Contact Brief  Facial Expression Flat  Affect Flat  Speech Soft;Slow  Interaction Childlike  Motor Activity Slow  Appearance/Hygiene Unremarkable  Behavior Characteristics Cooperative  Mood Sad  Thought Process  Coherency WDL  Content WDL  Delusions None reported or observed  Perception WDL  Hallucination None reported or observed  Judgment Poor  Confusion None  Danger to Self  Current suicidal ideation? Denies  Agreement Not to Harm Self Yes  Description of Agreement verbal  Danger to Others  Danger to Others None reported or observed

## 2023-02-03 NOTE — [facility] Group Notes (Signed)
02/03/2023 11:00am  Type of Group and Topic: Psychoeducational Group:  Wellness  Participation Level:  did not attend  Description of Group  Wellness group introduces the topic and its focus on developing healthy habits across the spectrum and its relationship to a decrease in hospital admissions.  Six areas of wellness are discussed: physical, social spiritual, intellectual, occupational, and emotional.  Patients are asked to consider their current wellness habits and to identify areas of wellness where they are interested and able to focus on improvements.    Therapeutic Goals Patients will understand components of wellness and how they can positively impact overall health.  Patients will identify areas of wellness where they have developed good habits. Patients will identify areas of wellness where they would like to make improvements.    Summary of Patient Progress     Therapeutic Modalities: Cognitive Behavioral Therapy Psychoeducation    Lorri Frederick, LCSW

## 2023-02-03 NOTE — Progress Notes (Signed)
Patient ID: Yolanda [name], female   DOB: [birth year], 47 y.o.   MRN: [id number] [facility] MD Progress Note  02/03/2023 1:34 PM Yolanda [name]  MRN:  [id number]  Reason for admission: This is the first psychiatric admission in this [facility] in 12 years for this 41 AA female with an extensive hx of mental illnesses & probable polysubstance use disorders. She is admitted to the [facility] from the [facility] hospital with complain of worsening suicidal ideations with plan to stab herself. Per chart review, patient apparently reported at the ED that she has been depressed for a while & has not been taking her mental health medications. After medical evaluation.clearance, she was transferred to the [facility] for further psychiatric evaluation/treatments.   APS report on 12/31/22 Has payee, no legal guardian Continues to await placement.  24 hr chart review: Sleep Hours last night: Good Nursing Concerns: Isolative to room & room lock out being implemented Behavioral episodes in the past 24 hrs: As above Medication Compliance: Compliant Vital Signs in the past 24 hrs: WNL PRN Medications in the past 24 hrs: None  Daily notes note: [name] is seen in her room. Chart reviewed. The chart findings discussed with the treatment team. She was lying down in bed but says she did get up for breakfast already. Says she will get out of bed soon. She continues to present with an improving  mood which fluctuates from day to day. She reports, "I'm just fine". Her affect is good/reactive, but appears as if trying to wake-up. She says she slept well last night. Had a good breakfast. She is taking & tolerating her treatment regimen. The [facility] treatment team continues to treat & maintain patient on a stable mood as her main need at this time is housing. The DSS are currently working to find placement for patient, likely in a group home setting. Until that happens, we will continue to work with patient & encouraged her to continue to take her  medications & remain active in the group sessions. She currently denies any SIHI, AVH, delusional thoughts or paranoia. She does not appear to be responding to any internal stimuli. Discussed this case with the attending psychiatrist. There are no changes made on her current plan of care. Continue as already in progress.  Principal Problem: Schizoaffective disorder, depressive type (HCC)   Diagnosis: Principal Problem:   Schizoaffective disorder, depressive type (HCC) Active Problems:   GERD (gastroesophageal reflux disease)   Intellectual disability   Tobacco use disorder  Past Psychiatric History: Patient is not forth coming with the answers to the assessment questions. She is refusing to provide information about her mental health hx or symptoms.   Past Medical History:  Past Medical History:  Diagnosis Date   Bipolar affect, depressed (HCC)    Constipation 08/17/2022   Depression    Falls 07/21/2022   Fracture of femoral neck, right, closed (HCC) 01/17/2022   Herpes simplex 08/22/2017   Open fracture dislocation of right elbow joint 01/17/2022    Past Surgical History:  Procedure Laterality Date   NO PAST SURGERIES     SALPINGECTOMY     Family History: History reviewed. No pertinent family history.  Family Psychiatric  History: See H&P.  Social History:  Social History   Substance and Sexual Activity  Alcohol Use Yes     Social History   Substance and Sexual Activity  Drug Use Yes   Types: Cocaine, Marijuana    Social History   Socioeconomic History  Marital status: Single    Spouse name: Not on file   Number of children: Not on file   Years of education: Not on file   Highest education level: Not on file  Occupational History   Not on file  Tobacco Use   Smoking status: Every Day   Smokeless tobacco: Not on file  Substance and Sexual Activity   Alcohol use: Yes   Drug use: Yes    Types: Cocaine, Marijuana   Sexual activity: Yes  Other Topics  Concern   Not on file  Social History Narrative   Not on file   Social Determinants of Health   Financial Resource Strain: Low Risk  (09/12/2022)   Received from [facility]   Overall Financial Resource Strain (CARDIA)    Difficulty of Paying Living Expenses: Not hard at all  Food Insecurity: Patient Declined (12/20/2022)   Hunger Vital Sign    Worried About Running Out of Food in the Last Year: Patient declined    Ran Out of Food in the Last Year: Patient declined  Transportation Needs: No Transportation Needs (12/20/2022)   PRAPARE - Administrator, Civil Service (Medical): No    Lack of Transportation (Non-Medical): No  Physical Activity: Not on file  Stress: No Stress Concern Present (07/17/2022)   Received from [facility] - Occupational Stress Questionnaire    Feeling of Stress : Not at all  Social Connections: Unknown (07/16/2022)   Received from [facility]   Social Network    Social Network: Not on file   Current Medications: Current Facility-Administered Medications  Medication Dose Route Frequency Provider Last Rate Last Admin   acetaminophen (TYLENOL) tablet 650 mg  650 mg Oral Q6H PRN [name], NP   650 mg at 01/19/23 1855   alum & mag hydroxide-simeth (MAALOX/MYLANTA) 200-200-20 MG/5ML suspension 30 mL  30 mL Oral Q4H PRN [name], NP   30 mL at 01/28/23 1530   busPIRone (BUSPAR) tablet 15 mg  15 mg Oral BID [name], NP   15 mg at 02/03/23 0749   cyanocobalamin (VITAMIN B12) injection 1,000 mcg  1,000 mcg Intramuscular Q30 days [name], Nadir, MD   1,000 mcg at 02/02/23 1238   diphenhydrAMINE (BENADRYL) capsule 50 mg  50 mg Oral TID PRN [name], NP   50 mg at 01/15/23 1211   Or   diphenhydrAMINE (BENADRYL) injection 50 mg  50 mg Intramuscular TID PRN [name], NP       feeding supplement (ENSURE ENLIVE / ENSURE PLUS) liquid 237 mL  237 mL  Oral TID BM Nkwenti, Doris, NP   237 mL at 02/02/23 2139   haloperidol (HALDOL) tablet 5 mg  5 mg Oral TID PRN [name], NP   5 mg at 01/15/23 1211   Or   haloperidol lactate (HALDOL) injection 5 mg  5 mg Intramuscular TID PRN [name], NP       hydrOXYzine (ATARAX) tablet 25 mg  25 mg Oral TID PRN [name], DO   25 mg at 02/02/23 1857   LORazepam (ATIVAN) tablet 2 mg  2 mg Oral TID PRN [name], NP   2 mg at 01/15/23 1211   Or   LORazepam (ATIVAN) injection 2 mg  2 mg Intramuscular TID PRN [name], NP       magnesium hydroxide (MILK OF MAGNESIA) suspension 30 mL  30  mL Oral Daily PRN [name], NP       melatonin tablet 5 mg  5 mg Oral QHS Nkwenti, Doris, NP   5 mg at 02/02/23 2139   nicotine (NICODERM CQ - dosed in mg/24 hours) patch 21 mg  21 mg Transdermal Daily [name], DO   21 mg at 02/03/23 0749   paliperidone (INVEGA) 24 hr tablet 6 mg  6 mg Oral Daily [name], NP   6 mg at 02/02/23 2139   pantoprazole (PROTONIX) EC tablet 40 mg  40 mg Oral Daily [name], NP   40 mg at 02/03/23 0749   polyethylene glycol (MIRALAX / GLYCOLAX) packet 17 g  17 g Oral Daily PRN [name], NP       sertraline (ZOLOFT) tablet 150 mg  150 mg Oral Daily Massengill, Harrold Donath, MD   150 mg at 02/03/23 0749   traZODone (DESYREL) tablet 50 mg  50 mg Oral QHS [name], NP   50 mg at 02/02/23 2139   Lab Results:  No results found for this or any previous visit (from the past 48 hour(s)).   Blood Alcohol level:  Lab Results  Component Value Date   ETH <10 12/19/2022   ETH <10 11/21/2019   Metabolic Disorder Labs: Lab Results  Component Value Date   HGBA1C 4.4 (L) 12/21/2022   MPG 79.58 12/21/2022   No results found for: "PROLACTIN" Lab Results  Component Value Date   CHOL 218 (H) 12/21/2022   TRIG 81 12/21/2022   HDL 53 12/21/2022   CHOLHDL 4.1 12/21/2022   VLDL 16 12/21/2022   LDLCALC 149 (H) 12/21/2022   LDLCALC 110 (H) 03/13/2011    Physical Findings: AIMS: Facial and Oral Movements Muscles of Facial Expression: None, normal Lips and Perioral Area: None, normal Jaw: None, normal Tongue: None, normal,Extremity Movements Upper (arms, wrists, hands, fingers): None, normal Lower (legs, knees, ankles, toes): None, normal, Trunk Movements Neck, shoulders, hips: None, normal, Overall Severity Severity of abnormal movements (highest score from questions above): None, normal Incapacitation due to abnormal movements: None, normal Patient's awareness of abnormal movements (rate only patient's report): No Awareness, Dental Status Current problems with teeth and/or dentures?: No Does patient usually wear dentures?: No   Musculoskeletal: Strength & Muscle Tone: within normal limits Gait & Station: normal Patient leans: N/A  Psychiatric Specialty Exam:  Presentation  General Appearance:  Lying down in bed, better hygiene noted  Eye Contact: Fair Speech: -- (Slow, slurred at times.)  Speech Volume: Decreased  Handedness: Right  Mood and Affect  Mood: -- (improving)  Affect: Congruent  Thought Process  Thought Processes: Goal Directed  Descriptions of Associations:Intact  Orientation:Full (Time, Place and Person)  Thought Content: Denies SI HI or AVH does not present responding to stimuli. Presentation consistent with moderate intellectual disability  History of Schizophrenia/Schizoaffective disorder:Yes  Duration of Psychotic Symptoms:Greater than six months  Hallucinations:Hallucinations: None Description of Auditory Hallucinations: NA    Ideas of Reference:None  Suicidal Thoughts:Suicidal Thoughts: No SI Active Intent and/or Plan: Without Intent; Without Plan; Without Means to Carry Out; Without Access to Means SI Passive Intent and/or Plan: Without Intent; Without Plan; Without Means to Carry Out; Without Access to Means    Homicidal Thoughts:Homicidal Thoughts: No    Sensorium   Memory: Immediate Fair; Recent Fair; Remote Fair  Judgment: Limited Insight: Limited Executive Functions  Concentration: Fair  Attention Span: Fair  Recall: [employer] of Knowledge: Fair  Language: Fair  Psychomotor  Activity  Psychomotor Activity: Psychomotor Activity: Decreased   Assets  Assets: Communication Skills; Desire for Improvement; Financial Resources/Insurance; Resilience; Social Support  Sleep  Sleep: Sleep: Good Number of Hours of Sleep: 8.5   Physical Exam: Physical Exam Vitals and nursing note reviewed.  Constitutional:      General: She is not in acute distress.    Appearance: She is obese. She is not ill-appearing or diaphoretic.  HENT:     Head: Normocephalic.     Comments: Intellectual disability    Nose: No congestion.     Mouth/Throat:     Pharynx: Oropharynx is clear.  Eyes:     Pupils: Pupils are equal, round, and reactive to light.  Cardiovascular:     Rate and Rhythm: Normal rate.     Pulses: Normal pulses.  Pulmonary:     Effort: Pulmonary effort is normal. No respiratory distress.  Abdominal:     Comments: Deferred  Genitourinary:    Comments: Deferred Musculoskeletal:        General: Normal range of motion.     Cervical back: Normal range of motion.  Skin:    General: Skin is warm and dry.  Neurological:     General: No focal deficit present.     Mental Status: She is alert.  Psychiatric:     Comments: Mood is labile and sad.    Blood pressure 110/72, pulse 97, temperature 98.4 F (36.9 C), temperature source Oral, resp. rate 16, height 4\' 11"  (1.499 m), weight 63 kg, SpO2 100%. Body mass index is 28.05 kg/m.  Treatment Plan Summary: Daily contact with patient to assess and evaluate symptoms and progress in treatment and Medication management.   Still waiting on safe disposition as patient would not be able to live independently given cognitive impairment. Recommend assisted living.   No medication side  effects.  Because of urinary incontinence, recommended adult diapers to be worn.  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  Plan:  -Continue Sertraline150 mg po Q daily for depression/anxiety -Continue Buspar 15 mg po bid for anxiety.  -Continue paliperidone 6 mg po qd for mood stabilization -Continue Trazodone 50 mg nightly for insomnia  -Continue Nicoderm 21 mg topically Q 24 hrs for nicotine withdrawal management. -Continue vitamin B12 1000 mcg IM weekly for 4 weeks then monthly -Continue Melatonin 5 mg nightly for sleep -Continue Protonix EC 40 mg p.o. daily for GERD -Continue MiraLAX 17 g p.o. PRN for constipation -Continue hydroxyzine 25 mg p.o. 3 times daily as needed for anxiety -Continue Ensure nutritional shakes TID in between meals  -Previously discontinued Hydroxyzine 50 mg nightly-hypotension in the mornings  Lab work and EKG, reviewed with no significant abnormalities noted, except slightly low b12 166.  01/05/2023, started on B12 1000 mcg injections q. weekly x 4 weeks, then q. monthly.  Safety and Monitoring: Voluntary admission to inpatient psychiatric unit for safety, stabilization and treatment Daily contact with patient to assess and evaluate symptoms and progress in treatment Patient's case to be discussed in multi-disciplinary team meeting Observation Level : q15 minute checks Vital signs: q12 hours Precautions: Safety   Discharge Planning: Social work and case management to assist with discharge planning and identification of hospital follow-up needs prior to discharge Estimated LOS: Unknown at this time. Discharge Concerns: Need to establish  a safety plan; Medication compliance and  effectiveness Discharge Goals: Return home with outpatient referrals for mental health follow-up including medication management/psychotherapy No legal guardian, has payee  Armandina Stammer, NP. 02/03/2023, 1:34 PM Patient ID: Larey Brick, female   DOB: 08-24-1975, 47 y.o.    MRN: [id number] Patient ID: STEPHANNY HOCTOR, female   DOB: [birth year], 47 y.o.   MRN: [id number] Patient ID: JACELLE MACINTOSH, female   DOB: April 30, 1976, 47 y.o.   MRN: [id number] Patient ID: IYAHNA LANGSAM, female   DOB: 1976/03/12, 47 y.o.   MRN: [id number] Patient ID: MAEVERY WASMUND, female   DOB: June 20, 1976, 47 y.o.   MRN: [id number] Patient ID: STEVETTE FOLLETTE, female   DOB: 05-Aug-1975, 47 y.o.   MRN: [id number]

## 2023-02-03 NOTE — Group Note (Unsigned)
Date:  02/03/2023 Time:  12:08 PM  Group Topic/Focus:  Goals Group:   The focus of this group is to help patients establish daily goals to achieve during treatment and discuss how the patient can incorporate goal setting into their daily lives to aide in recovery.     Participation Level:  {BHH PARTICIPATION MVHQI:69629}  Participation Quality:  {BHH PARTICIPATION QUALITY:22265}  Affect:  {BHH AFFECT:22266}  Cognitive:  {BHH COGNITIVE:22267}  Insight: {BHH Insight2:20797}  Engagement in Group:  {BHH ENGAGEMENT IN BMWUX:32440}  Modes of Intervention:  {BHH MODES OF INTERVENTION:22269}  Additional Comments:  ***  [name] 02/03/2023, 12:08 PM

## 2023-02-03 NOTE — BHH Group Notes (Signed)
BHH Group Notes:  (Nursing/MHT/Case Management/Adjunct)  Date:  02/03/2023  Time:  9:48 PM  Type of Therapy:  Wrap Up Group  Participation Level:  Did Not Attend  Participation Quality:   n/a  Affect:   n/a  Cognitive:   n/a  Insight:  None  Engagement in Group:   n/a  Modes of Intervention:   n/a  Summary of Progress/Problems: Pt did not attend group  Jolyssa Oplinger E Sacheen Arrasmith 02/03/2023, 9:48 PM

## 2023-02-03 NOTE — Progress Notes (Signed)
   02/03/23 0555  15 Minute Checks  Location Bedroom  Visual Appearance Calm  Behavior Composed  Sleep (Behavioral Health Patients Only)  Calculate sleep? (Click Yes once per 24 hr at 0600 safety check) Yes  Documented sleep last 24 hours 7

## 2023-02-03 NOTE — Group Note (Unsigned)
Date:  02/03/2023 Time:  12:10 PM  Group Topic/Focus:  Goals Group:   The focus of this group is to help patients establish daily goals to achieve during treatment and discuss how the patient can incorporate goal setting into their daily lives to aide in recovery.     Participation Level:  {BHH PARTICIPATION ZOXWR:60454}  Participation Quality:  {BHH PARTICIPATION QUALITY:22265}  Affect:  {BHH AFFECT:22266}  Cognitive:  {BHH COGNITIVE:22267}  Insight: {BHH Insight2:20797}  Engagement in Group:  {BHH ENGAGEMENT IN UJWJX:91478}  Modes of Intervention:  {BHH MODES OF INTERVENTION:22269}  Additional Comments:  ***  [name] 02/03/2023, 12:10 PM

## 2023-02-03 NOTE — Group Note (Signed)
Date:  02/03/2023 Time:  3:44 PM  Group Topic/Focus:  Wellness Toolbox:   The focus of this group is to discuss various aspects of wellness, balancing those aspects and exploring ways to increase the ability to experience wellness.  Patients will create a wellness toolbox for use upon discharge.    Participation Level:  Active  Participation Quality:  Appropriate  Affect:  Appropriate  Cognitive:  Appropriate  Insight: Appropriate  Engagement in Group:  Engaged  Modes of Intervention:  Education  Additional Comments:     [name] 02/03/2023, 3:44 PM

## 2023-02-04 DIAGNOSIS — F251 Schizoaffective disorder, depressive type: Secondary | ICD-10-CM | POA: Diagnosis not present

## 2023-02-04 NOTE — Group Note (Signed)
Recreation Therapy Group Note   Group Topic:Health and Wellness  Group Date: 02/04/2023 Start Time: 0930 End Time: 1000 Facilitators: Fiora Weill-McCall, LRT,CTRS Location: [address] Dayroom   Goal Area(s) Addresses:  Patient will identify positive stress management techniques. Patient will identify benefits of using stress management post d/c.     Group Description: Stress Management. LRT and patients discussed the importance of stress management.  Patients were to complete a worksheet that describing their largest source of stress in detail. Patient then needed to list two stressors they are currently experiencing and then circle the symptoms they experience in response to their stress. LRT also played music as patients completed the worksheet.   Affect/Mood: N/A   Participation Level: Did not attend    Clinical Observations/Individualized Feedback:     Plan: Continue to engage patient in RT group sessions 2-3x/week.   Verdis Koval-McCall, LRT,CTRS 02/04/2023 12:57 PM

## 2023-02-04 NOTE — Progress Notes (Signed)
D) Pt received calm, visible, participating in milieu, and in no acute distress. Pt A & O x4. Pt denies SI, HI, A/ V H, depression, anxiety and rates pain 5/10 head ache at this time. A) Pt encouraged to drink fluids. Pt encouraged to come to staff with needs. Pt encouraged to attend and participate in groups. Pt encouraged to set reachable goals.  R) Pt remained safe on unit, in no acute distress, will continue to assess.   Pt reports one of the newer patients is picking on her and making her feel bad.     02/04/23 2000  Psych Admission Type (Psych Patients Only)  Admission Status Voluntary  Psychosocial Assessment  Patient Complaints None  Eye Contact Fair  Facial Expression Pained  Affect Sad  Speech Slow;Soft  Interaction Childlike  Motor Activity Slow  Appearance/Hygiene Unremarkable  Behavior Characteristics Cooperative  Mood Pleasant  Thought Process  Coherency WDL  Content WDL  Delusions None reported or observed  Perception WDL  Hallucination None reported or observed  Judgment Impaired  Confusion None  Danger to Self  Current suicidal ideation? Denies  Description of Suicide Plan no plan  Self-Injurious Behavior No self-injurious ideation or behavior indicators observed or expressed   Agreement Not to Harm Self Yes  Description of Agreement verbal  Danger to Others  Danger to Others None reported or observed

## 2023-02-04 NOTE — Progress Notes (Signed)
   02/03/23 2110  Psych Admission Type (Psych Patients Only)  Admission Status Voluntary  Psychosocial Assessment  Patient Complaints Sadness  Eye Contact Fair  Facial Expression Sad  Affect Flat  Speech Soft;Slow  Interaction Childlike  Motor Activity Slow  Appearance/Hygiene Unremarkable  Behavior Characteristics Cooperative  Mood Sad;Pleasant  Thought Process  Coherency WDL  Content WDL  Delusions None reported or observed  Perception WDL  Hallucination None reported or observed  Judgment Impaired  Confusion None  Danger to Self  Current suicidal ideation? Denies  Agreement Not to Harm Self Yes  Description of Agreement verbal  Danger to Others  Danger to Others None reported or observed   Pt was offered support and encouragement. Pt was given scheduled medications. Q 15 minute checks were done for safety. Pt did not attend group. Interacts well with peers and staff. Pt has no complaints.Pt receptive to treatment and safety maintained on unit.

## 2023-02-04 NOTE — BHH Group Notes (Signed)
Adult Psychoeducational Group Note  Date:  02/04/2023 Time:  8:11 PM  Group Topic/Focus:  Wrap-Up Group:   The focus of this group is to help patients review their daily goal of treatment and discuss progress on daily workbooks.  Participation Level:  None  Participation Quality:      Affect:  Appropriate  Cognitive:    Insight: None  Engagement in Group:  None  Modes of Intervention:    Additional Comments:  Pt. Did not attend group.  Joselyn Arrow 02/04/2023, 8:11 PM

## 2023-02-04 NOTE — Progress Notes (Signed)
   02/04/23 1647 02/04/23 1706  Vital Signs  Pulse Rate 88 92  Pulse Rate Source Monitor Monitor  BP (!) 87/60 (!) 87/69  BP Location Left Arm Left Arm  BP Method Automatic Automatic  Patient Position (if appropriate) Sitting Sitting  Oxygen Therapy  SpO2 99 % 98 %    Encouraged patient to drink water and gave patient gingerale, she says she does not like the Gatorade here. Denies any dizziness at this time or symptoms of low BP.

## 2023-02-04 NOTE — BHH Group Notes (Signed)
  Adult Psychoeducational Group Note  Date:  02/04/2023 Time:  10:30 AM  Group Topic/Focus:  Goals Group:   The focus of this group is to help patients establish daily goals to achieve during treatment and discuss how the patient can incorporate goal setting into their daily lives to aide in recovery. Recovery Goals:   The focus of this group is to identify appropriate goals for recovery and establish a plan to achieve them.  Participation Level:  Did Not Attend  Participation Quality:    Affect:    Cognitive:    Insight:   Engagement in Group:    Modes of Intervention:    Additional Comments:  Pt did not attend group.   Amyia Lodwick 02/04/2023, 10:30 AM

## 2023-02-04 NOTE — Progress Notes (Signed)
   02/04/23 0557  15 Minute Checks  Location Bedroom  Visual Appearance Calm  Behavior Sleeping  Sleep (Behavioral Health Patients Only)  Calculate sleep? (Click Yes once per 24 hr at 0600 safety check) Yes  Documented sleep last 24 hours 5.5

## 2023-02-04 NOTE — Plan of Care (Signed)
  Problem: Coping: Goal: Will verbalize feelings Outcome: Progressing   Problem: Safety: Goal: Periods of time without injury will increase Outcome: Progressing   

## 2023-02-04 NOTE — Progress Notes (Signed)
Patient ID: Yolanda [name], female   DOB: [birth year], 47 y.o.   MRN: [id number] [facility] MD Progress Note  02/04/2023 3:09 PM Caylea REISE HURLOCK  MRN:  [id number]  Reason for admission: This is the first psychiatric admission in this [facility] in 12 years for this 69 AA female with an extensive hx of mental illnesses & probable polysubstance use disorders. She is admitted to the [facility] from the [facility] hospital with complain of worsening suicidal ideations with plan to stab herself. Per chart review, patient apparently reported at the ED that she has been depressed for a while & has not been taking her mental health medications. After medical evaluation.clearance, she was transferred to the [facility] for further psychiatric evaluation/treatments.   APS report on 12/31/22 Has payee, no legal guardian Continues to await placement.  Yesterday the psychiatry team made the following recommendations:  -Continue Sertraline150 mg po Q daily for depression/anxiety -Continue Buspar 15 mg po bid for anxiety.  -Continue paliperidone 6 mg po qd for mood stabilization -Continue Trazodone 50 mg nightly for insomnia  -Continue Nicoderm 21 mg topically Q 24 hrs for nicotine withdrawal management. -Continue vitamin B12 1000 mcg IM weekly for 4 weeks then monthly -Continue Melatonin 5 mg nightly for sleep -Continue Protonix EC 40 mg p.o. daily for GERD -Continue MiraLAX 17 g p.o. PRN for constipation -Continue hydroxyzine 25 mg p.o. 3 times daily as needed for anxiety -Continue Ensure nutritional shakes TID in between meals  -Previously discontinued Hydroxyzine 50 mg nightly-hypotension in the mornings  On assessment today, the patient got up, used the bathroom and went back to bed.  When asked to sit up for the evaluation reports, "I am still sleepy and  I want to sleep some more."  She is alert and oriented x 3, slow to respond at times, due to her limited intellectual disability.  Reports her mood is anxious and depressed  due to still remaining in the day hospital.  This provider reiterated that the DSS and our treatment team are currently working to find placement for her, likely in a group home setting.  Further, informed patient that  the treatment team continues to treat & maintain patient on a stable mood as her main need at this time is housing.  Noted a slight smile to patient face.  Reports that anxiety is less and manageable Nursing staff report patient sleeping over 7.5 hours last night Appetite is good and observed snacks and drinks around her bed Concentration is fair Energy level is adequate. She denies suicidal thoughts.  Denies suicidal intent or plan.  Denies having any HI.  Denies having psychotic symptoms.   Denies having side effects to current psychiatric medications and no adjustment made to her treatment regimen today.    We discussed compliance to current medication regimen while in the hospital and after discharge to prevent relapse on her symptoms.  Discussed the following psychosocial stressors: Attending therapeutic milieu and unit group activities and this could help improve her mood.  Further, reiterates improvement on her hygiene needs and caring for her ADLs.  Principal Problem: Schizoaffective disorder, depressive type (HCC)   Diagnosis: Principal Problem:   Schizoaffective disorder, depressive type (HCC) Active Problems:   GERD (gastroesophageal reflux disease)   Intellectual disability   Tobacco use disorder  Past Psychiatric History: Patient is not forth coming with the answers to the assessment questions. She is refusing to provide information about her mental health hx or symptoms.   Past Medical History:  Past Medical History:  Diagnosis Date   Bipolar affect, depressed (HCC)    Constipation 08/17/2022   Depression    Falls 07/21/2022   Fracture of femoral neck, right, closed (HCC) 01/17/2022   Herpes simplex 08/22/2017   Open fracture dislocation of right  elbow joint 01/17/2022    Past Surgical History:  Procedure Laterality Date   NO PAST SURGERIES     SALPINGECTOMY     Family History: History reviewed. No pertinent family history.  Family Psychiatric  History: See H&P.  Social History:  Social History   Substance and Sexual Activity  Alcohol Use Yes     Social History   Substance and Sexual Activity  Drug Use Yes   Types: Cocaine, Marijuana    Social History   Socioeconomic History   Marital status: Single    Spouse name: Not on file   Number of children: Not on file   Years of education: Not on file   Highest education level: Not on file  Occupational History   Not on file  Tobacco Use   Smoking status: Every Day   Smokeless tobacco: Not on file  Substance and Sexual Activity   Alcohol use: Yes   Drug use: Yes    Types: Cocaine, Marijuana   Sexual activity: Yes  Other Topics Concern   Not on file  Social History Narrative   Not on file   Social Determinants of Health   Financial Resource Strain: Low Risk  (09/12/2022)   Received from [facility], [facility]   Overall Financial Resource Strain (CARDIA)    Difficulty of Paying Living Expenses: Not hard at all  Food Insecurity: Patient Declined (12/20/2022)   Hunger Vital Sign    Worried About Running Out of Food in the Last Year: Patient declined    Ran Out of Food in the Last Year: Patient declined  Transportation Needs: No Transportation Needs (12/20/2022)   PRAPARE - Administrator, Civil Service (Medical): No    Lack of Transportation (Non-Medical): No  Physical Activity: Not on file  Stress: No Stress Concern Present (07/17/2022)   Received from [facility], [facility] - Occupational Stress Questionnaire    Feeling of Stress : Not at all  Social Connections: Unknown (07/16/2022)   Received from [facility], [facility]   Social Network    Social Network: Not on file   Current  Medications: Current Facility-Administered Medications  Medication Dose Route Frequency Provider Last Rate Last Admin   acetaminophen (TYLENOL) tablet 650 mg  650 mg Oral Q6H PRN [name], NP   650 mg at 02/04/23 1142   alum & mag hydroxide-simeth (MAALOX/MYLANTA) 200-200-20 MG/5ML suspension 30 mL  30 mL Oral Q4H PRN [name], NP   30 mL at 01/28/23 1530   busPIRone (BUSPAR) tablet 15 mg  15 mg Oral BID [name], NP   15 mg at 02/04/23 0801   cyanocobalamin (VITAMIN B12) injection 1,000 mcg  1,000 mcg Intramuscular Q30 days Sarita Bottom, MD   1,000 mcg at 02/02/23 1238   diphenhydrAMINE (BENADRYL) capsule 50 mg  50 mg Oral TID PRN [name], NP   50 mg at 01/15/23 1211   Or   diphenhydrAMINE (BENADRYL) injection 50 mg  50 mg Intramuscular TID PRN [name], NP       feeding supplement (ENSURE ENLIVE / ENSURE PLUS) liquid 237 mL  237 mL Oral TID BM [name], NP  237 mL at 02/04/23 1055   haloperidol (HALDOL) tablet 5 mg  5 mg Oral TID PRN [name], NP   5 mg at 01/15/23 1211   Or   haloperidol lactate (HALDOL) injection 5 mg  5 mg Intramuscular TID PRN [name], NP       hydrOXYzine (ATARAX) tablet 25 mg  25 mg Oral TID PRN [name], DO   25 mg at 02/03/23 2111   LORazepam (ATIVAN) tablet 2 mg  2 mg Oral TID PRN [name], NP   2 mg at 01/15/23 1211   Or   LORazepam (ATIVAN) injection 2 mg  2 mg Intramuscular TID PRN [name], NP       magnesium hydroxide (MILK OF MAGNESIA) suspension 30 mL  30 mL Oral Daily PRN [name], NP       melatonin tablet 5 mg  5 mg Oral QHS Nkwenti, Doris, NP   5 mg at 02/03/23 2108   nicotine (NICODERM CQ - dosed in mg/24 hours) patch 21 mg  21 mg Transdermal Daily [name], DO   21 mg at 02/04/23 0802   paliperidone (INVEGA) 24 hr tablet 6 mg  6 mg Oral Daily [name], NP   6 mg at 02/03/23 2109   pantoprazole (PROTONIX) EC tablet 40 mg  40 mg Oral Daily [name], NP   40 mg at 02/04/23  0801   polyethylene glycol (MIRALAX / GLYCOLAX) packet 17 g  17 g Oral Daily PRN [name], NP       sertraline (ZOLOFT) tablet 150 mg  150 mg Oral Daily Massengill, Harrold Donath, MD   150 mg at 02/04/23 0801   traZODone (DESYREL) tablet 50 mg  50 mg Oral QHS [name], NP   50 mg at 02/03/23 2109   Lab Results:  No results found for this or any previous visit (from the past 48 hour(s)).  Blood Alcohol level:  Lab Results  Component Value Date   ETH <10 12/19/2022   ETH <10 11/21/2019   Metabolic Disorder Labs: Lab Results  Component Value Date   HGBA1C 4.4 (L) 12/21/2022   MPG 79.58 12/21/2022   No results found for: "PROLACTIN" Lab Results  Component Value Date   CHOL 218 (H) 12/21/2022   TRIG 81 12/21/2022   HDL 53 12/21/2022   CHOLHDL 4.1 12/21/2022   VLDL 16 12/21/2022   LDLCALC 149 (H) 12/21/2022   LDLCALC 110 (H) 03/13/2011   Physical Findings: AIMS: Facial and Oral Movements Muscles of Facial Expression: None, normal Lips and Perioral Area: None, normal Jaw: None, normal Tongue: None, normal,Extremity Movements Upper (arms, wrists, hands, fingers): None, normal Lower (legs, knees, ankles, toes): None, normal, Trunk Movements Neck, shoulders, hips: None, normal, Overall Severity Severity of abnormal movements (highest score from questions above): None, normal Incapacitation due to abnormal movements: None, normal Patient's awareness of abnormal movements (rate only patient's report): No Awareness, Dental Status Current problems with teeth and/or dentures?: No Does patient usually wear dentures?: No   Musculoskeletal: Strength & Muscle Tone: within normal limits Gait & Station: normal Patient leans: N/A  Psychiatric Specialty Exam:  Presentation  General Appearance:  Lying down in bed, better hygiene noted  Eye Contact: Fair Speech: Slow; Slurred (Slurred at times)  Speech Volume: Decreased  Handedness: Right  Mood and Affect   Mood: Anxious; Depressed (Reports anxious and depressed for being here at the hospital)  Affect: Congruent  Thought Process  Thought Processes: Coherent  Descriptions of Associations:Intact  Orientation:Full (Time, Place and Person)  Thought Content: Denies SI HI or AVH does not present responding to stimuli. Presentation consistent with moderate intellectual disability  History of Schizophrenia/Schizoaffective disorder:Yes  Duration of Psychotic Symptoms:Greater than six months  Hallucinations:Hallucinations: None Description of Auditory Hallucinations: Denies today  Ideas of Reference:None  Suicidal Thoughts:Suicidal Thoughts: No SI Active Intent and/or Plan: -- (Denies) SI Passive Intent and/or Plan: -- (denies)  Homicidal Thoughts:Homicidal Thoughts: No  Sensorium  Memory: Immediate Fair; Recent Fair  Judgment: Limited Insight: Limited Executive Functions  Concentration: Fair  Attention Span: Fair  Recall: [employer] of Knowledge: Fair  Language: Fair  Psychomotor Activity  Psychomotor Activity: Psychomotor Activity: Mannerisms  Assets  Assets: Communication Skills; Desire for Improvement; Resilience; Physical Health  Sleep  Sleep: Sleep: Good Number of Hours of Sleep: 7.5  Physical Exam: Physical Exam Vitals and nursing note reviewed.  Constitutional:      General: She is not in acute distress.    Appearance: She is obese. She is not ill-appearing or diaphoretic.  HENT:     Head: Normocephalic.     Comments: Intellectual disability    Nose: No congestion.     Mouth/Throat:     Mouth: Mucous membranes are moist.     Pharynx: Oropharynx is clear.  Eyes:     Pupils: Pupils are equal, round, and reactive to light.  Cardiovascular:     Rate and Rhythm: Normal rate.     Pulses: Normal pulses.  Pulmonary:     Effort: Pulmonary effort is normal. No respiratory distress.  Abdominal:     Comments: Deferred  Genitourinary:     Comments: Deferred Musculoskeletal:        General: Normal range of motion.     Cervical back: Normal range of motion.  Skin:    General: Skin is warm.  Neurological:     General: No focal deficit present.     Mental Status: She is alert.  Psychiatric:        Behavior: Behavior normal.    Blood pressure 109/67, pulse 83, temperature 98 F (36.7 C), temperature source Oral, resp. rate 20, height 4\' 11"  (1.499 m), weight 63 kg, SpO2 100%. Body mass index is 28.05 kg/m.  Treatment Plan Summary: Daily contact with patient to assess and evaluate symptoms and progress in treatment and Medication management.   Still waiting on safe disposition as patient would not be able to live independently given cognitive impairment. Recommend assisted living.   No medication side effects.  Because of urinary incontinence, recommended adult diapers to be worn.  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  Plan:  -Continue Sertraline150 mg po Q daily for depression/anxiety -Continue Buspar 15 mg po bid for anxiety.  -Continue paliperidone 6 mg po qd for mood stabilization -Continue Trazodone 50 mg nightly for insomnia  -Continue Nicoderm 21 mg topically Q 24 hrs for nicotine withdrawal management. -Continue vitamin B12 1000 mcg IM weekly for 4 weeks then monthly -Continue Melatonin 5 mg nightly for sleep -Continue Protonix EC 40 mg p.o. daily for GERD -Continue MiraLAX 17 g p.o. PRN for constipation -Continue hydroxyzine 25 mg p.o. 3 times daily as needed for anxiety -Continue Ensure nutritional shakes TID in between meals  -Previously discontinued Hydroxyzine 50 mg nightly-hypotension in  the mornings  Lab work and EKG, reviewed with no  significant abnormalities noted, except slightly low b12 166.  01/05/2023, started on B12 1000 mcg injections q. weekly x 4 weeks, then q. monthly.  Safety and Monitoring: Voluntary admission to inpatient psychiatric unit for safety, stabilization and treatment Daily contact with patient to assess and evaluate symptoms and progress in treatment Patient's case to be discussed in multi-disciplinary team meeting Observation Level : q15 minute checks Vital signs: q12 hours Precautions: Safety   Discharge Planning: Social work and case management to assist with discharge planning and identification of hospital follow-up needs prior to discharge Estimated LOS: Unknown at this time. Discharge Concerns: Need to establish a safety plan; Medication compliance and effectiveness Discharge Goals: Return home with outpatient referrals for mental health follow-up including medication management/psychotherapy No legal guardian, has payee  Cecilie Lowers, FNP. 02/04/2023, 3:09 PM Patient ID: Larey Brick, female   DOB: 07-19-1975, 47 y.o.    MRN: [id number] Patient ID: JALEESA CONSTANTINO, female   DOB: 20-Jun-1976, 47 y.o.   MRN: [id number] Patient ID: KHIYA WESTHOFF, female   DOB: 24-Oct-1975, 47 y.o.   MRN: [id number] Patient ID: ELEENE NICOLAUS, female   DOB: 1975-08-23, 47 y.o.   MRN: [id number] Patient ID: MARIADEJESUS STORRER, female   DOB: 1975-10-15, 47 y.o.   MRN: [id number] Patient ID: EMYLEE BOLTER, female   DOB: 02-22-1976, 47 y.o.   MRN: [id number] Patient ID: ALIANDRA GARDE, female   DOB: [birth year], 47 y.o.   MRN: [id number]

## 2023-02-04 NOTE — Plan of Care (Signed)
  Problem: Coping: Goal: Coping ability will improve Outcome: Progressing Goal: Will verbalize feelings Outcome: Progressing   Problem: Health Behavior/Discharge Planning: Goal: Compliance with therapeutic regimen will improve Outcome: Progressing   Problem: Role Relationship: Goal: Will demonstrate positive changes in social behaviors and relationships Outcome: Progressing   Problem: Safety: Goal: Ability to disclose and discuss suicidal ideas will improve Outcome: Progressing Goal: Ability to identify and utilize support systems that promote safety will improve Outcome: Progressing

## 2023-02-04 NOTE — [facility] Group Notes (Signed)
Adult Psychoeducational Group Note  Date:  02/04/2023 Time:  1:42 PM  Group Topic/Focus:  Managing Feelings:   The focus of this group is to identify what feelings patients have difficulty handling and develop a plan to handle them in a healthier way upon discharge.  Participation Level:  Did Not Attend  Participation Quality:    Affect:    Cognitive:    Insight:   Engagement in Group:    Modes of Intervention:    Additional Comments:  Pt did into attend group  [employer] 02/04/2023, 1:42 PM

## 2023-02-04 NOTE — Progress Notes (Signed)
D: Patient alert and oriented. Affect/mood reported as improving. Denies SI, HI, AVH, and pain. Patient did not identify a goal for today. She has been sleeping most of the shift today.  A: Scheduled medication administered to patient, per MD orders. Support and encouragement provided. Routine safety checks conducted every 15 minutes. Patient informed to notify staff with problems or concerns.   R: No adverse drug reactions noted. Patient contracts for safety at this time. Patient compliant with medications and treatment plan. Patient receptive, calm and cooperative. Patient interacts well with others on unit. Patient remains safe at this time.

## 2023-02-05 DIAGNOSIS — F251 Schizoaffective disorder, depressive type: Secondary | ICD-10-CM | POA: Diagnosis not present

## 2023-02-05 NOTE — [facility] Group Notes (Signed)
Adult Psychoeducational Group Note  Date:  02/05/2023 Time:  9:32 PM  Group Topic/Focus:  Wrap-Up Group:   The focus of this group is to help patients review their daily goal of treatment and discuss progress on daily workbooks.  Participation Level:  Active  Participation Quality:  Appropriate  Affect:  Appropriate  Cognitive:  Appropriate  Insight: Appropriate  Engagement in Group:  Engaged  Modes of Intervention:  Discussion  Additional Comments:  Pt. Did not attend group.  Joselyn Arrow 02/05/2023, 9:32 PM

## 2023-02-05 NOTE — Plan of Care (Signed)
  Problem: Education: Goal: Utilization of techniques to improve thought processes will improve Outcome: Progressing Goal: Knowledge of the prescribed therapeutic regimen will improve Outcome: Progressing   Problem: Activity: Goal: Interest or engagement in leisure activities will improve Outcome: Progressing Goal: Imbalance in normal sleep/wake cycle will improve Outcome: Progressing   Problem: Coping: Goal: Coping ability will improve Outcome: Progressing Goal: Will verbalize feelings Outcome: Progressing   Problem: Health Behavior/Discharge Planning: Goal: Ability to make decisions will improve Outcome: Progressing Goal: Compliance with therapeutic regimen will improve Outcome: Progressing   Problem: Role Relationship: Goal: Will demonstrate positive changes in social behaviors and relationships Outcome: Progressing   Problem: Safety: Goal: Ability to disclose and discuss suicidal ideas will improve Outcome: Progressing Goal: Ability to identify and utilize support systems that promote safety will improve Outcome: Progressing   Problem: Self-Concept: Goal: Will verbalize positive feelings about self Outcome: Progressing Goal: Level of anxiety will decrease Outcome: Progressing   Problem: Education: Goal: Utilization of techniques to improve thought processes will improve Outcome: Progressing Goal: Knowledge of the prescribed therapeutic regimen will improve Outcome: Progressing   Problem: Activity: Goal: Interest or engagement in leisure activities will improve Outcome: Progressing Goal: Imbalance in normal sleep/wake cycle will improve Outcome: Progressing   Problem: Coping: Goal: Coping ability will improve Outcome: Progressing Goal: Will verbalize feelings Outcome: Progressing   Problem: Health Behavior/Discharge Planning: Goal: Ability to make decisions will improve Outcome: Progressing Goal: Compliance with therapeutic regimen will  improve Outcome: Progressing   Problem: Role Relationship: Goal: Will demonstrate positive changes in social behaviors and relationships Outcome: Progressing   Problem: Safety: Goal: Ability to disclose and discuss suicidal ideas will improve Outcome: Progressing Goal: Ability to identify and utilize support systems that promote safety will improve Outcome: Progressing   Problem: Self-Concept: Goal: Will verbalize positive feelings about self Outcome: Progressing Goal: Level of anxiety will decrease Outcome: Progressing   Problem: Education: Goal: Ability to make informed decisions regarding treatment will improve Outcome: Progressing   Problem: Coping: Goal: Coping ability will improve Outcome: Progressing   Problem: Health Behavior/Discharge Planning: Goal: Identification of resources available to assist in meeting health care needs will improve Outcome: Progressing   Problem: Medication: Goal: Compliance with prescribed medication regimen will improve Outcome: Progressing   Problem: Self-Concept: Goal: Ability to disclose and discuss suicidal ideas will improve Outcome: Progressing Goal: Will verbalize positive feelings about self Outcome: Progressing   Problem: Education: Goal: Knowledge of [facility] Education information/materials will improve Outcome: Progressing Goal: Emotional status will improve Outcome: Progressing Goal: Mental status will improve Outcome: Progressing Goal: Verbalization of understanding the information provided will improve Outcome: Progressing   Problem: Activity: Goal: Interest or engagement in activities will improve Outcome: Progressing Goal: Sleeping patterns will improve Outcome: Progressing   Problem: Coping: Goal: Ability to verbalize frustrations and anger appropriately will improve Outcome: Progressing Goal: Ability to demonstrate self-control will improve Outcome: Progressing   Problem: Health  Behavior/Discharge Planning: Goal: Identification of resources available to assist in meeting health care needs will improve Outcome: Progressing Goal: Compliance with treatment plan for underlying cause of condition will improve Outcome: Progressing   Problem: Physical Regulation: Goal: Ability to maintain clinical measurements within normal limits will improve Outcome: Progressing   Problem: Safety: Goal: Periods of time without injury will increase Outcome: Progressing

## 2023-02-05 NOTE — Group Note (Signed)
Recreation Therapy Group Note   Group Topic:Animal Assisted Therapy   Group Date: 02/05/2023 Start Time: 5621 End Time: 1030 Facilitators: Ronn Smolinsky-McCall, LRT,CTRS Location: [address] Dayroom   Animal-Assisted Activity (AAA) Program Checklist/Progress Notes Patient Eligibility Criteria Checklist & Daily Group note for Rec Tx Intervention  AAA/T Program Assumption of Risk Form signed by Patient/ or Parent Legal Guardian Yes  Patient understands his/her participation is voluntary Yes   Affect/Mood: N/A   Participation Level: Did not attend    Clinical Observations/Individualized Feedback:     Plan: Continue to engage patient in RT group sessions 2-3x/week.   Yolanda [name], LRT,CTRS  02/05/2023 1:15 PM

## 2023-02-05 NOTE — Progress Notes (Signed)
Patient ID: Yolanda [name], female   DOB: 03/15/1976, 47 y.o.   MRN: [id number] [facility] MD Progress Note  02/05/2023 2:05 PM Yolanda [name]  MRN:  [id number]  Reason for admission: This is the first psychiatric admission in this [facility] in 12 years for this 42 AA female with an extensive hx of mental illnesses & probable polysubstance use disorders. She is admitted to the [facility] from the [facility] hospital with complain of worsening suicidal ideations with plan to stab herself. Per chart review, patient apparently reported at the ED that she has been depressed for a while & has not been taking her mental health medications. After medical evaluation.clearance, she was transferred to the [facility] for further psychiatric evaluation/treatments.   APS report on 12/31/22 Has payee, no legal guardian Continues to await placement.  24 hr chart review: Sleep Hours last night:Nursing reports a good sleep quality Nursing Concerns: isolative to room, not engaging in activities Behavioral episodes in the past 24 hrs:as above  Medication Compliance: compliant  Vital Signs in the past 24 hrs: SBP low in the 90s and DBP in the 50s PRN Medications in the past 24 hrs: Hydroxyzine and Tylenol  Patient assessment note: During today's assessment, patient is in bed, not receptive to assessment, refuses to engage with Clinical research associate.  Denies SI/HI/AVH.  Denies paranoia.  Answers all questions by nodding her head "no".  While being educated on the need to get out of bed and engage in activities, patient becomes upset, and raise his voice and Clinical research associate, and screams out "no!", to being asked to get out of bed and go to the day room for activities.  Patient has room lockout order in place, and her staff RN has been educated on the need to implemented.  If not implemented, patient prefers to stay in bed all day and not engage, and also not participate in unit activities, and not shower.  Patient has been educated that it is expected for her to  get out of bed and go to meals, and that no meals will be delivered to her room. Pt has been educated on the need to continue to drink fluids, since BP has been running low. As per staff reports, sleep last night was good, and she is eating.  We are continuing to await DSS, to coordinate with our CSW's to place patient in a safe environment in the community.  We are continuing medications as listed below, and adding Vitamin D 50.000 units weekly for low Vit D levels.   Principal Problem: Schizoaffective disorder, depressive type (HCC)   Diagnosis: Principal Problem:   Schizoaffective disorder, depressive type (HCC) Active Problems:   GERD (gastroesophageal reflux disease)   Intellectual disability   Tobacco use disorder  Past Psychiatric History: Patient is not forth coming with the answers to the assessment questions. She is refusing to provide information about her mental health hx or symptoms.   Past Medical History:  Past Medical History:  Diagnosis Date   Bipolar affect, depressed (HCC)    Constipation 08/17/2022   Depression    Falls 07/21/2022   Fracture of femoral neck, right, closed (HCC) 01/17/2022   Herpes simplex 08/22/2017   Open fracture dislocation of right elbow joint 01/17/2022    Past Surgical History:  Procedure Laterality Date   NO PAST SURGERIES     SALPINGECTOMY     Family History: History reviewed. No pertinent family history.  Family Psychiatric  History: See H&P.  Social History:  Social History  Substance and Sexual Activity  Alcohol Use Yes     Social History   Substance and Sexual Activity  Drug Use Yes   Types: Cocaine, Marijuana    Social History   Socioeconomic History   Marital status: Single    Spouse name: Not on file   Number of children: Not on file   Years of education: Not on file   Highest education level: Not on file  Occupational History   Not on file  Tobacco Use   Smoking status: Every Day   Smokeless tobacco: Not  on file  Substance and Sexual Activity   Alcohol use: Yes   Drug use: Yes    Types: Cocaine, Marijuana   Sexual activity: Yes  Other Topics Concern   Not on file  Social History Narrative   Not on file   Social Determinants of Health   Financial Resource Strain: Low Risk  (09/12/2022)   Received from [facility]   Overall Financial Resource Strain (CARDIA)    Difficulty of Paying Living Expenses: Not hard at all  Food Insecurity: Patient Declined (12/20/2022)   Hunger Vital Sign    Worried About Running Out of Food in the Last Year: Patient declined    Ran Out of Food in the Last Year: Patient declined  Transportation Needs: No Transportation Needs (12/20/2022)   PRAPARE - Administrator, Civil Service (Medical): No    Lack of Transportation (Non-Medical): No  Physical Activity: Not on file  Stress: No Stress Concern Present (07/17/2022)   Received from [facility], [facility] of Occupational Health - Occupational Stress Questionnaire    Feeling of Stress : Not at all  Social Connections: Unknown (07/16/2022)   Received from [facility], [facility]   Social Network    Social Network: Not on file   Current Medications: Current Facility-Administered Medications  Medication Dose Route Frequency Provider Last Rate Last Admin   acetaminophen (TYLENOL) tablet 650 mg  650 mg Oral Q6H PRN [name], NP   650 mg at 02/04/23 1950   alum & mag hydroxide-simeth (MAALOX/MYLANTA) 200-200-20 MG/5ML suspension 30 mL  30 mL Oral Q4H PRN [name], NP   30 mL at 01/28/23 1530   busPIRone (BUSPAR) tablet 15 mg  15 mg Oral BID [name], NP   15 mg at 02/05/23 0859   cyanocobalamin (VITAMIN B12) injection 1,000 mcg  1,000 mcg Intramuscular Q30 days Sarita Bottom, MD   1,000 mcg at 02/02/23 1238   diphenhydrAMINE (BENADRYL) capsule 50 mg  50 mg Oral TID PRN [name], NP   50 mg at 01/15/23 1211   Or   diphenhydrAMINE  (BENADRYL) injection 50 mg  50 mg Intramuscular TID PRN [name], NP       feeding supplement (ENSURE ENLIVE / ENSURE PLUS) liquid 237 mL  237 mL Oral TID BM Shiree Altemus, NP   237 mL at 02/05/23 1201   haloperidol (HALDOL) tablet 5 mg  5 mg Oral TID PRN [name], NP   5 mg at 01/15/23 1211   Or   haloperidol lactate (HALDOL) injection 5 mg  5 mg Intramuscular TID PRN [name], NP       hydrOXYzine (ATARAX) tablet 25 mg  25 mg Oral TID PRN [name], DO   25 mg at 02/04/23 1843   LORazepam (ATIVAN) tablet 2 mg  2 mg Oral TID PRN [name], NP  2 mg at 01/15/23 1211   Or   LORazepam (ATIVAN) injection 2 mg  2 mg Intramuscular TID PRN [name], NP       magnesium hydroxide (MILK OF MAGNESIA) suspension 30 mL  30 mL Oral Daily PRN [name], NP       melatonin tablet 5 mg  5 mg Oral QHS Shakiya Mcneary, NP   5 mg at 02/03/23 2108   nicotine (NICODERM CQ - dosed in mg/24 hours) patch 21 mg  21 mg Transdermal Daily [name], DO   21 mg at 02/05/23 0901   paliperidone (INVEGA) 24 hr tablet 6 mg  6 mg Oral Daily [name], NP   6 mg at 02/04/23 2042   pantoprazole (PROTONIX) EC tablet 40 mg  40 mg Oral Daily [name], NP   40 mg at 02/05/23 0859   polyethylene glycol (MIRALAX / GLYCOLAX) packet 17 g  17 g Oral Daily PRN [name], NP       sertraline (ZOLOFT) tablet 150 mg  150 mg Oral Daily Massengill, Harrold Donath, MD   150 mg at 02/05/23 0859   traZODone (DESYREL) tablet 50 mg  50 mg Oral QHS [name], NP   50 mg at 02/04/23 2042   Lab Results:  No results found for this or any previous visit (from the past 48 hour(s)).  Blood Alcohol level:  Lab Results  Component Value Date   ETH <10 12/19/2022   ETH <10 11/21/2019   Metabolic Disorder Labs: Lab Results  Component Value Date   HGBA1C 4.4 (L) 12/21/2022   MPG 79.58 12/21/2022   No results found for: "PROLACTIN" Lab Results  Component Value Date   CHOL 218 (H) 12/21/2022   TRIG  81 12/21/2022   HDL 53 12/21/2022   CHOLHDL 4.1 12/21/2022   VLDL 16 12/21/2022   LDLCALC 149 (H) 12/21/2022   LDLCALC 110 (H) 03/13/2011   Physical Findings: AIMS: Facial and Oral Movements Muscles of Facial Expression: None, normal Lips and Perioral Area: None, normal Jaw: None, normal Tongue: None, normal,Extremity Movements Upper (arms, wrists, hands, fingers): None, normal Lower (legs, knees, ankles, toes): None, normal, Trunk Movements Neck, shoulders, hips: None, normal, Overall Severity Severity of abnormal movements (highest score from questions above): None, normal Incapacitation due to abnormal movements: None, normal Patient's awareness of abnormal movements (rate only patient's report): No Awareness, Dental Status Current problems with teeth and/or dentures?: No Does patient usually wear dentures?: No   Musculoskeletal: Strength & Muscle Tone: within normal limits Gait & Station: normal Patient leans: N/A  Psychiatric Specialty Exam:  Presentation  General Appearance:  Lying down in bed, better hygiene noted  Eye Contact: Fair Speech: Clear and Coherent  Speech Volume: Increased  Handedness: Right  Mood and Affect  Mood: Depressed  Affect: Congruent  Thought Process  Thought Processes: Coherent  Descriptions of Associations:Intact  Orientation:Partial  Thought Content: Denies SI HI or AVH does not present responding to stimuli. Presentation consistent with moderate intellectual disability  History of Schizophrenia/Schizoaffective disorder:Yes  Duration of Psychotic Symptoms:Greater than six months  Hallucinations:Hallucinations: None Description of Auditory Hallucinations: Denies today  Ideas of Reference:None  Suicidal Thoughts:Suicidal Thoughts: No SI Active Intent and/or Plan: -- (Denies) SI Passive Intent and/or Plan: -- (denies)  Homicidal Thoughts:Homicidal Thoughts: No  Sensorium  Memory: Immediate  Good  Judgment: Limited Insight: Limited Executive Functions  Concentration: Poor  Attention Span: Fair  Recall: [employer] of Knowledge: Fair  Language: Fair  Psychomotor  Activity  Psychomotor Activity: Psychomotor Activity: Normal  Assets  Assets: Resilience  Sleep  Sleep: Sleep: Good Number of Hours of Sleep: 7.5  Physical Exam: Physical Exam Vitals and nursing note reviewed.  Constitutional:      General: She is not in acute distress.    Appearance: She is obese. She is not ill-appearing or diaphoretic.  HENT:     Head: Normocephalic.     Comments: Intellectual disability    Nose: No congestion.     Mouth/Throat:     Mouth: Mucous membranes are moist.     Pharynx: Oropharynx is clear.  Eyes:     Pupils: Pupils are equal, round, and reactive to light.  Cardiovascular:     Rate and Rhythm: Normal rate.     Pulses: Normal pulses.  Pulmonary:     Effort: Pulmonary effort is normal. No respiratory distress.  Abdominal:     Comments: Deferred  Genitourinary:    Comments: Deferred Musculoskeletal:        General: Normal range of motion.     Cervical back: Normal range of motion.  Skin:    General: Skin is warm.  Neurological:     General: No focal deficit present.     Mental Status: She is alert.  Psychiatric:        Behavior: Behavior normal.    Blood pressure (!) 98/59, pulse 78, temperature 98.1 F (36.7 C), temperature source Oral, resp. rate 20, height 4\' 11"  (1.499 m), weight 63 kg, SpO2 100%. Body mass index is 28.05 kg/m.  Treatment Plan Summary: Daily contact with patient to assess and evaluate symptoms and progress in treatment and Medication management.   Still waiting on safe disposition as patient would not be able to live independently given cognitive impairment. Recommend assisted living.   No medication side effects.  Because of urinary incontinence, recommended adult diapers to be worn.  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  Plan:  -Start Vitamin D 50.000 units weekly for Vitamin D deficiency -Continue Sertraline150 mg po Q daily for depression/anxiety -Continue Buspar 15 mg po bid for anxiety.  -Continue paliperidone 6 mg po qd for mood stabilization -Continue Trazodone 50 mg nightly for insomnia  -Continue Nicoderm 21 mg topically Q 24 hrs for nicotine withdrawal management. -Continue vitamin B12 1000 mcg IM weekly for 4 weeks then monthly -Continue Melatonin 5 mg nightly for sleep -Continue Protonix EC 40 mg p.o. daily for GERD -Continue MiraLAX 17 g p.o. PRN for constipation -Continue hydroxyzine 25 mg p.o. 3 times daily as needed for anxiety -Continue Ensure nutritional shakes TID in between meals  -Previously discontinued Hydroxyzine 50 mg nightly-hypotension in the mornings  Safety and Monitoring: Voluntary admission to inpatient psychiatric unit for safety, stabilization and treatment Daily contact with patient to assess and evaluate symptoms and progress in treatment Patient's case to be discussed in multi-disciplinary team meeting Observation Level : q15 minute checks Vital signs: q12 hours Precautions: Safety   Discharge Planning: Social work and case management to assist with discharge planning and identification of hospital follow-up needs prior to discharge Estimated LOS: Unknown at this time. Discharge Concerns: Need to establish a safety plan; Medication compliance and effectiveness Discharge Goals: Return home with outpatient referrals for mental health follow-up including medication management/psychotherapy No legal guardian, has  payee  [name], NP. 02/05/2023, 2:05 PM Patient ID: Yolanda [name],  female   DOB: 07-29-1975, 47 y.o.    MRN: [id number]

## 2023-02-05 NOTE — [facility] Group Notes (Signed)
Adult Psychoeducational Group Note  Date:  02/05/2023 Time:  11:02 AM  Group Topic/Focus:  Goals Group:   The focus of this group is to help patients establish daily goals to achieve during treatment and discuss how the patient can incorporate goal setting into their daily lives to aide in recovery. Orientation:   The focus of this group is to educate the patient on the purpose and policies of crisis stabilization and provide a format to answer questions about their admission.  The group details unit policies and expectations of patients while admitted.  Participation Level:  Did Not Attend  Participation Quality:    Affect:    Cognitive:    Insight:   Engagement in Group:    Modes of Intervention:    Additional Comments:    Sheran Lawless 02/05/2023, 11:02 AM

## 2023-02-05 NOTE — Progress Notes (Signed)
D) Pt received calm, visible, participating in milieu, and in no acute distress. Pt A & O x4. Pt denies SI, HI, A/ V H, depression, anxiety and pain at this time. A) Pt encouraged to drink fluids. Pt encouraged to come to staff with needs. Pt encouraged to attend and participate in groups. Pt encouraged to set reachable goals.  R) Pt remained safe on unit, in no acute distress, will continue to assess.     02/05/23 2000  Psych Admission Type (Psych Patients Only)  Admission Status Voluntary  Psychosocial Assessment  Patient Complaints None  Eye Contact Fair  Facial Expression Pained  Affect Sad  Speech Slow;Soft  Interaction Childlike  Motor Activity Slow  Appearance/Hygiene Unremarkable  Behavior Characteristics Cooperative  Mood Pleasant  Thought Process  Coherency WDL  Content WDL  Delusions Persecutory (reports pt making fun and bullying her, not witnessed by staff)  Perception WDL  Hallucination None reported or observed  Judgment Impaired  Confusion None  Danger to Self  Current suicidal ideation? Denies  Self-Injurious Behavior No self-injurious ideation or behavior indicators observed or expressed   Agreement Not to Harm Self Yes  Description of Agreement verbal  Danger to Others  Danger to Others None reported or observed

## 2023-02-05 NOTE — Progress Notes (Signed)
Patient appears anxious. Patient denies SI/HI/AVH. Pt reports good sleep and good appetite. Patient complied with morning medication with no reported side effects. Patient remains safe on Q23min checks and contracts for safety.      02/05/23 1025  Psych Admission Type (Psych Patients Only)  Admission Status Voluntary  Psychosocial Assessment  Patient Complaints None  Eye Contact Fair  Facial Expression Flat  Affect Sad  Speech Soft;Slow  Interaction Childlike  Motor Activity Slow  Appearance/Hygiene Unremarkable  Behavior Characteristics Cooperative  Mood Pleasant  Thought Process  Coherency WDL  Content WDL  Delusions None reported or observed  Perception WDL  Hallucination None reported or observed  Judgment Impaired  Confusion None  Danger to Self  Current suicidal ideation? Denies  Self-Injurious Behavior No self-injurious ideation or behavior indicators observed or expressed   Agreement Not to Harm Self Yes  Description of Agreement verbal  Danger to Others  Danger to Others None reported or observed

## 2023-02-06 ENCOUNTER — Encounter (HOSPITAL_COMMUNITY): Payer: Self-pay

## 2023-02-06 DIAGNOSIS — F251 Schizoaffective disorder, depressive type: Secondary | ICD-10-CM | POA: Diagnosis not present

## 2023-02-06 NOTE — Plan of Care (Signed)
  Problem: Education: Goal: Utilization of techniques to improve thought processes will improve Outcome: Progressing Goal: Knowledge of the prescribed therapeutic regimen will improve Outcome: Progressing   Problem: Activity: Goal: Interest or engagement in leisure activities will improve Outcome: Progressing Goal: Imbalance in normal sleep/wake cycle will improve Outcome: Progressing   Problem: Coping: Goal: Coping ability will improve Outcome: Progressing Goal: Will verbalize feelings Outcome: Progressing   Problem: Health Behavior/Discharge Planning: Goal: Ability to make decisions will improve Outcome: Progressing Goal: Compliance with therapeutic regimen will improve Outcome: Progressing   Problem: Role Relationship: Goal: Will demonstrate positive changes in social behaviors and relationships Outcome: Progressing   Problem: Safety: Goal: Ability to disclose and discuss suicidal ideas will improve Outcome: Progressing Goal: Ability to identify and utilize support systems that promote safety will improve Outcome: Progressing   Problem: Self-Concept: Goal: Will verbalize positive feelings about self Outcome: Progressing Goal: Level of anxiety will decrease Outcome: Progressing   Problem: Education: Goal: Utilization of techniques to improve thought processes will improve Outcome: Progressing Goal: Knowledge of the prescribed therapeutic regimen will improve Outcome: Progressing   Problem: Activity: Goal: Interest or engagement in leisure activities will improve Outcome: Progressing Goal: Imbalance in normal sleep/wake cycle will improve Outcome: Progressing   Problem: Coping: Goal: Coping ability will improve Outcome: Progressing Goal: Will verbalize feelings Outcome: Progressing   Problem: Health Behavior/Discharge Planning: Goal: Ability to make decisions will improve Outcome: Progressing Goal: Compliance with therapeutic regimen will  improve Outcome: Progressing   Problem: Role Relationship: Goal: Will demonstrate positive changes in social behaviors and relationships Outcome: Progressing   Problem: Safety: Goal: Ability to disclose and discuss suicidal ideas will improve Outcome: Progressing Goal: Ability to identify and utilize support systems that promote safety will improve Outcome: Progressing   Problem: Self-Concept: Goal: Will verbalize positive feelings about self Outcome: Progressing Goal: Level of anxiety will decrease Outcome: Progressing   Problem: Education: Goal: Ability to make informed decisions regarding treatment will improve Outcome: Progressing   Problem: Coping: Goal: Coping ability will improve Outcome: Progressing   Problem: Health Behavior/Discharge Planning: Goal: Identification of resources available to assist in meeting health care needs will improve Outcome: Progressing   Problem: Medication: Goal: Compliance with prescribed medication regimen will improve Outcome: Progressing   Problem: Self-Concept: Goal: Ability to disclose and discuss suicidal ideas will improve Outcome: Progressing Goal: Will verbalize positive feelings about self Outcome: Progressing   Problem: Education: Goal: Knowledge of [facility] Education information/materials will improve Outcome: Progressing Goal: Emotional status will improve Outcome: Progressing Goal: Mental status will improve Outcome: Progressing Goal: Verbalization of understanding the information provided will improve Outcome: Progressing   Problem: Activity: Goal: Interest or engagement in activities will improve Outcome: Progressing Goal: Sleeping patterns will improve Outcome: Progressing   Problem: Coping: Goal: Ability to verbalize frustrations and anger appropriately will improve Outcome: Progressing Goal: Ability to demonstrate self-control will improve Outcome: Progressing   Problem: Health  Behavior/Discharge Planning: Goal: Identification of resources available to assist in meeting health care needs will improve Outcome: Progressing Goal: Compliance with treatment plan for underlying cause of condition will improve Outcome: Progressing   Problem: Physical Regulation: Goal: Ability to maintain clinical measurements within normal limits will improve Outcome: Progressing   Problem: Safety: Goal: Periods of time without injury will increase Outcome: Progressing

## 2023-02-06 NOTE — [facility] Group Notes (Signed)
Spiritual care group on grief and loss facilitated by Chaplain Dyanne Carrel, [facility]  Group Goal: Support / Education around grief and loss  Members engage in facilitated group support and psycho-social education.  Group Description:  Following introductions and group rules, group members engaged in facilitated group dialogue and support around topic of loss, with particular support around experiences of loss in their lives. Group Identified types of loss (relationships / self / things) and identified patterns, circumstances, and changes that precipitate losses. Reflected on thoughts / feelings around loss, normalized grief responses, and recognized variety in grief experience. Group encouraged individual reflection on safe space and on the coping skills that they are already utilizing.  Group drew on Adlerian / Rogerian and narrative framework  Patient Progress: Yolanda [name] attended group , but did not participated.  She did demonstrate engagement through eye contact.

## 2023-02-06 NOTE — Progress Notes (Addendum)
Patient ID: Yolanda [name], female   DOB: Jan 01, 1976, 47 y.o.   MRN: [id number] [facility] MD Progress Note  02/06/2023 3:24 PM Cletus WINNONA CALVINO  MRN:  [id number]  Reason for admission: This is the first psychiatric admission in this [facility] in 12 years for this 88 AA female with an extensive hx of mental illnesses & probable polysubstance use disorders. She is admitted to the [facility] from the [facility] hospital with complain of worsening suicidal ideations with plan to stab herself. Per chart review, patient apparently reported at the ED that she has been depressed for a while & has not been taking her mental health medications. After medical evaluation.clearance, she was transferred to the [facility] for further psychiatric evaluation/treatments.   APS report on 12/31/22 Has payee, no legal guardian Continues to await placement.  24 hr chart review: Sleep Hours last night: Good. Nursing Concerns: isolative to room, engaging in activities with encouragement. Behavioral episodes in the past 24 hrs:as above  Medication Compliance: compliant  Vital Signs in the past 24 hrs: Stable.  Daily notes: Brexley is seen in her room. Chart reviewed. The chart findings discussed with the treatment team. Prior to that she was in the day room with the other patients. She is lying down in bed reading her bible. She presents with a flat, but a reactive affect. She is making a fair eye contact. She is verbally responsive. However, she speaks slowly & her speech is garbled most of the time. She reports, "I'm feeling pretty okay, just a little depressed. I have been up this morning, been in the dayroom. I just got back to my room. I slept well last night. I do eat. Can you all fine me a place to live?". Patient currently denies any SIHI, AVH, delusional thoughts or paranoia. She does not appear to be responding to any internal stimuli. Staff continues to encourage patient to come out of her room & participate in the group sessions.  Discussed this case with the attending psychiatrist. Continue current plan of car as already in progress.  We are continuing to await DSS, to coordinate with our CSW's to place patient in a safe environment in the community.  We are continuing medications as listed below, and adding Vitamin D 50.000 units weekly for low Vit D levels.   Principal Problem: Schizoaffective disorder, depressive type (HCC)   Diagnosis: Principal Problem:   Schizoaffective disorder, depressive type (HCC) Active Problems:   GERD (gastroesophageal reflux disease)   Intellectual disability   Tobacco use disorder  Past Psychiatric History: Patient is not forth coming with the answers to the assessment questions. She is refusing to provide information about her mental health hx or symptoms.   Past Medical History:  Past Medical History:  Diagnosis Date   Bipolar affect, depressed (HCC)    Constipation 08/17/2022   Depression    Falls 07/21/2022   Fracture of femoral neck, right, closed (HCC) 01/17/2022   Herpes simplex 08/22/2017   Open fracture dislocation of right elbow joint 01/17/2022    Past Surgical History:  Procedure Laterality Date   NO PAST SURGERIES     SALPINGECTOMY     Family History: History reviewed. No pertinent family history.  Family Psychiatric  History: See H&P.  Social History:  Social History   Substance and Sexual Activity  Alcohol Use Yes     Social History   Substance and Sexual Activity  Drug Use Yes   Types: Cocaine, Marijuana    Social History  Socioeconomic History   Marital status: Single    Spouse name: Not on file   Number of children: Not on file   Years of education: Not on file   Highest education level: Not on file  Occupational History   Not on file  Tobacco Use   Smoking status: Every Day   Smokeless tobacco: Not on file  Substance and Sexual Activity   Alcohol use: Yes   Drug use: Yes    Types: Cocaine, Marijuana   Sexual activity: Yes   Other Topics Concern   Not on file  Social History Narrative   Not on file   Social Determinants of Health   Financial Resource Strain: Low Risk  (09/12/2022)   Received from [facility], [facility]   Overall Financial Resource Strain (CARDIA)    Difficulty of Paying Living Expenses: Not hard at all  Food Insecurity: Patient Declined (12/20/2022)   Hunger Vital Sign    Worried About Running Out of Food in the Last Year: Patient declined    Ran Out of Food in the Last Year: Patient declined  Transportation Needs: No Transportation Needs (12/20/2022)   PRAPARE - Administrator, Civil Service (Medical): No    Lack of Transportation (Non-Medical): No  Physical Activity: Not on file  Stress: No Stress Concern Present (07/17/2022)   Received from [facility], [facility] - Occupational Stress Questionnaire    Feeling of Stress : Not at all  Social Connections: Unknown (07/16/2022)   Received from [facility], [facility]   Social Network    Social Network: Not on file   Current Medications: Current Facility-Administered Medications  Medication Dose Route Frequency Provider Last Rate Last Admin   acetaminophen (TYLENOL) tablet 650 mg  650 mg Oral Q6H PRN [name], NP   650 mg at 02/04/23 1950   alum & mag hydroxide-simeth (MAALOX/MYLANTA) 200-200-20 MG/5ML suspension 30 mL  30 mL Oral Q4H PRN [name], NP   30 mL at 01/28/23 1530   busPIRone (BUSPAR) tablet 15 mg  15 mg Oral BID [name], NP   15 mg at 02/06/23 0827   cyanocobalamin (VITAMIN B12) injection 1,000 mcg  1,000 mcg Intramuscular Q30 days [name], Nadir, MD   1,000 mcg at 02/02/23 1238   diphenhydrAMINE (BENADRYL) capsule 50 mg  50 mg Oral TID PRN [name], NP   50 mg at 01/15/23 1211   Or   diphenhydrAMINE (BENADRYL) injection 50 mg  50 mg Intramuscular TID PRN [name], NP       feeding supplement (ENSURE ENLIVE / ENSURE PLUS) liquid 237  mL  237 mL Oral TID BM Nkwenti, Doris, NP   237 mL at 02/06/23 1411   haloperidol (HALDOL) tablet 5 mg  5 mg Oral TID PRN [name], NP   5 mg at 01/15/23 1211   Or   haloperidol lactate (HALDOL) injection 5 mg  5 mg Intramuscular TID PRN [name], NP       hydrOXYzine (ATARAX) tablet 25 mg  25 mg Oral TID PRN [name], DO   25 mg at 02/06/23 1458   LORazepam (ATIVAN) tablet 2 mg  2 mg Oral TID PRN [name], NP   2 mg at 01/15/23 1211   Or   LORazepam (ATIVAN) injection 2 mg  2 mg Intramuscular TID PRN [name], NP       magnesium hydroxide (MILK OF MAGNESIA) suspension  30 mL  30 mL Oral Daily PRN [name], NP       melatonin tablet 5 mg  5 mg Oral QHS Nkwenti, Doris, NP   5 mg at 02/03/23 2108   nicotine (NICODERM CQ - dosed in mg/24 hours) patch 21 mg  21 mg Transdermal Daily [name], DO   21 mg at 02/05/23 0901   paliperidone (INVEGA) 24 hr tablet 6 mg  6 mg Oral Daily [name], NP   6 mg at 02/05/23 2128   pantoprazole (PROTONIX) EC tablet 40 mg  40 mg Oral Daily [name], NP   40 mg at 02/06/23 0827   polyethylene glycol (MIRALAX / GLYCOLAX) packet 17 g  17 g Oral Daily PRN [name], NP       sertraline (ZOLOFT) tablet 150 mg  150 mg Oral Daily Massengill, Harrold Donath, MD   150 mg at 02/06/23 0827   traZODone (DESYREL) tablet 50 mg  50 mg Oral QHS [name], NP   50 mg at 02/05/23 2127   Lab Results:  No results found for this or any previous visit (from the past 48 hour(s)).  Blood Alcohol level:  Lab Results  Component Value Date   ETH <10 12/19/2022   ETH <10 11/21/2019   Metabolic Disorder Labs: Lab Results  Component Value Date   HGBA1C 4.4 (L) 12/21/2022   MPG 79.58 12/21/2022   No results found for: "PROLACTIN" Lab Results  Component Value Date   CHOL 218 (H) 12/21/2022   TRIG 81 12/21/2022   HDL 53 12/21/2022   CHOLHDL 4.1 12/21/2022   VLDL 16 12/21/2022   LDLCALC 149 (H) 12/21/2022   LDLCALC 110 (H)  03/13/2011   Physical Findings: AIMS: Facial and Oral Movements Muscles of Facial Expression: None, normal Lips and Perioral Area: None, normal Jaw: None, normal Tongue: None, normal,Extremity Movements Upper (arms, wrists, hands, fingers): None, normal Lower (legs, knees, ankles, toes): None, normal, Trunk Movements Neck, shoulders, hips: None, normal, Overall Severity Severity of abnormal movements (highest score from questions above): None, normal Incapacitation due to abnormal movements: None, normal Patient's awareness of abnormal movements (rate only patient's report): No Awareness, Dental Status Current problems with teeth and/or dentures?: No Does patient usually wear dentures?: No   Musculoskeletal: Strength & Muscle Tone: within normal limits Gait & Station: normal Patient leans: N/A  Psychiatric Specialty Exam:  Presentation  General Appearance:  Lying down in bed, better hygiene noted  Eye Contact: Fair Speech: Clear and Coherent  Speech Volume: Decreased  Handedness: Right  Mood and Affect  Mood: Depressed  Affect: Congruent; Flat  Thought Process  Thought Processes: Coherent; Goal Directed  Descriptions of Associations:Intact  Orientation:Partial  Thought Content: Denies SI HI or AVH does not present responding to stimuli. Presentation consistent with moderate intellectual disability  History of Schizophrenia/Schizoaffective disorder:Yes  Duration of Psychotic Symptoms:Greater than six months  Hallucinations:Hallucinations: None Description of Auditory Hallucinations: NA  Ideas of Reference:None  Suicidal Thoughts:Suicidal Thoughts: No SI Passive Intent and/or Plan: Without Intent; Without Plan; Without Means to Carry Out; Without Access to Means  Homicidal Thoughts:Homicidal Thoughts: No  Sensorium  Memory: Immediate Good  Judgment: Limited Insight: Limited Executive Functions  Concentration: Fair  Attention  Span: Fair  Recall: [employer] of Knowledge: Fair  Language: Fair  Psychomotor Activity  Psychomotor Activity: Psychomotor Activity: Normal  Assets  Assets: Resilience  Sleep  Sleep: Sleep: Good Number of Hours of Sleep: 8  Physical Exam: Physical Exam Vitals  and nursing note reviewed.  Constitutional:      General: She is not in acute distress.    Appearance: She is obese. She is not ill-appearing or diaphoretic.  HENT:     Head: Normocephalic.     Comments: Intellectual disability    Nose: No congestion.     Mouth/Throat:     Mouth: Mucous membranes are moist.     Pharynx: Oropharynx is clear.  Eyes:     Pupils: Pupils are equal, round, and reactive to light.  Cardiovascular:     Rate and Rhythm: Normal rate.     Pulses: Normal pulses.  Pulmonary:     Effort: Pulmonary effort is normal. No respiratory distress.  Abdominal:     Comments: Deferred  Genitourinary:    Comments: Deferred Musculoskeletal:        General: Normal range of motion.     Cervical back: Normal range of motion.  Skin:    General: Skin is warm.  Neurological:     General: No focal deficit present.     Mental Status: She is alert.  Psychiatric:        Behavior: Behavior normal.    Blood pressure 105/72, pulse 77, temperature 98.1 F (36.7 C), temperature source Oral, resp. rate 20, height 4\' 11"  (1.499 m), weight 63 kg, SpO2 100%. Body mass index is 28.05 kg/m.  Treatment Plan Summary: Daily contact with patient to assess and evaluate symptoms and progress in treatment and Medication management.   Still waiting on safe disposition as patient would not be able to live independently given cognitive impairment. Recommend assisted living.   No medication side effects.  Because of urinary incontinence, recommended adult diapers to be worn.  Principal/active diagnoses:  Principal Problem:   Schizoaffective disorder, depressive type (HCC) Active Problems:   GERD  (gastroesophageal reflux disease)   Intellectual disability   Tobacco use disorder (MOCA 16/30) moderate cognitive impairment probably related to moderate intellectual disability.  Plan:  -Continue Vitamin D 50.000 units weekly for bone health. -Continue Sertraline150 mg po Q daily for depression/anxiety -Continue Buspar 15 mg po bid for anxiety.  -Continue paliperidone 6 mg po qd for mood stabilization -Continue Trazodone 50 mg nightly for insomnia  -Continue Nicoderm 21 mg topically Q 24 hrs for nicotine withdrawal management. -Continue vitamin B12 1000 mcg IM weekly for 4 weeks then monthly -Continue Melatonin 5 mg nightly for sleep -Continue Protonix EC 40 mg p.o. daily for GERD -Continue MiraLAX 17 g p.o. PRN for constipation -Continue hydroxyzine 25 mg p.o. 3 times daily as needed for anxiety -Continue Ensure nutritional shakes TID in between meals  -Previously discontinued Hydroxyzine 50 mg nightly-hypotension in the mornings  Safety and Monitoring: Voluntary admission to inpatient psychiatric unit for safety, stabilization and treatment Daily contact with patient to assess and evaluate symptoms and progress in treatment Patient's case to be discussed in multi-disciplinary team meeting Observation Level : q15 minute checks Vital signs: q12 hours Precautions: Safety   Discharge Planning: Social work and case management to assist with discharge planning and identification of hospital follow-up needs prior to discharge Estimated LOS: Unknown at this time. Discharge Concerns: Need to establish a safety plan; Medication compliance and effectiveness Discharge Goals: Return home with outpatient referrals for mental health follow-up including medication management/psychotherapy No legal guardian, has payee  Armandina Stammer, NP.Marland Kitchen [id number]. 02/06/2023, 3:24 PM Patient ID: Larey Brick, female   DOB: 1975/09/12, 47 y.o.    MRN: [id number] Patient ID: Larey Brick, female  DOB:  03-11-1976, 47 y.o.   MRN: [id number]

## 2023-02-06 NOTE — Progress Notes (Signed)
Patient appears depressed. Patient denies SI/HI/AVH. Pt reports anxiety is 8/10 and depression is 8/10. Pt reports good sleep and good appetite. Patient complied with morning medication with no reported side effects. Patient remains safe on Q61min checks and contracts for safety.      02/06/23 1013  Psych Admission Type (Psych Patients Only)  Admission Status Involuntary  Psychosocial Assessment  Patient Complaints Anxiety;Depression  Eye Contact Fair  Facial Expression Animated  Affect Sad  Speech Slow;Soft  Interaction Childlike  Motor Activity Slow  Appearance/Hygiene Unremarkable  Behavior Characteristics Cooperative  Mood Pleasant  Thought Process  Coherency WDL  Content WDL  Delusions Persecutory  Perception WDL  Hallucination None reported or observed  Judgment Impaired  Confusion None  Danger to Self  Current suicidal ideation? Denies  Self-Injurious Behavior No self-injurious ideation or behavior indicators observed or expressed   Agreement Not to Harm Self Yes  Description of Agreement verbal  Danger to Others  Danger to Others None reported or observed

## 2023-02-06 NOTE — Group Note (Signed)
Recreation Therapy Group Note   Group Topic:Other  Group Date: 02/06/2023 Start Time: 1610 End Time: 1015 Facilitators: Kamyrah Feeser-McCall, LRT,CTRS Location: [address] Dayroom   Goal Area(s) Addresses:  Patient will identify positive leisure and recreation activities.  Patient will identify one positive benefit of participation in leisure activities.    Group Description: Music Trivia. Patients were divided in to 2 groups for game play. LRT read song lyrics from songs in the  1990s and 2000s hip hop and R&B from a playing card. Each team takes turns answering the question. If they are unable to answer the question, the other team gets the chance to steal the point. The team with the most points wins the game.    Affect/Mood: Appropriate   Participation Level: None   Participation Quality: None   Behavior: Attentive    Speech/Thought Process: None   Insight: None   Judgement: None   Modes of Intervention: Competitive Play   Patient Response to Interventions:  Attentive   Education Outcome:  In group clarification offered    Clinical Observations/Individualized Feedback: Pt attended group. Pt didn't participate but was attentive as peers played the game.    Plan: Continue to engage patient in RT group sessions 2-3x/week.   Krystle Oberman-McCall, LRT,CTRS 02/06/2023 11:47 AM

## 2023-02-06 NOTE — [facility] Group Notes (Signed)
Psychoeducational Group Note  Date:  02/06/2023 Time:  2000  Group Topic/Focus:  Narcotics Anonymous Meeting  Participation Level: Did Not Attend  Participation Quality:  Not Applicable  Affect:  Not Applicable  Cognitive:  Not Applicable  Insight:  Not Applicable  Engagement in Group: Not Applicable  Additional Comments:  Did not attend.   [name] 02/06/2023, 9:24 PM

## 2023-02-06 NOTE — [facility] Group Notes (Signed)
[facility] Group Notes:  (Nursing/MHT/Case Management/Adjunct)  Date:  02/06/2023  Time:  10:16 AM  Type of Therapy:  Group Topic/ Focus: Goals Group: The focus of this group is to help patients establish daily goals to achieve during treatment and discuss how the patient can incorporate goal setting into their daily lives to aide in recovery.   Participation Level:  Did Not Attend  Summary of Progress/Problems:  Patient did not attend goals group today. Patient was encouraged but refused.   Yolanda [name] 02/06/2023, 10:16 AM

## 2023-02-07 DIAGNOSIS — F251 Schizoaffective disorder, depressive type: Secondary | ICD-10-CM

## 2023-02-07 NOTE — Progress Notes (Signed)
Patient ID: Yolanda [name], female   DOB: [birth year], 47 y.o.   MRN: [id number] [facility] MD Progress Note  02/07/2023 3:33 PM Yolanda [name]  MRN:  [id number]  Reason for admission: This is the first psychiatric admission in this [facility] in 12 years for this 70 AA female with an extensive hx of mental illnesses & probable polysubstance use disorders. She is admitted to the [facility] from the [facility] hospital with complain of worsening suicidal ideations with plan to stab herself. Per chart review, patient apparently reported at the ED that she has been depressed for a while & has not been taking her mental health medications. After medical evaluation.clearance, she was transferred to the [facility] for further psychiatric evaluation/treatments.   APS report on 12/31/22 Has payee, no legal guardian Continues to await placement.  24 hr chart review: Sleep Hours last night: Good. Nursing Concerns: isolative to room, engaging in activities with encouragement. Behavioral episodes in the past 24 hrs:as above  Medication Compliance: compliant  Vital Signs in the past 24 hrs: Stable.  Daily notes: There are no changes noted. Yolanda [name] remains the same. Yolanda [name] is seen in her room. Chart reviewed. The chart findings discussed with the treatment team. Prior to that she was in the day room with the other patients. She is lying down in bed reading her bible. She presents with a flat, but a reactive affect. She is making a fair eye contact. She is verbally responsive. However, she speaks slowly & her speech is garbled most of the time. She reports, "My mood is okay. I have been up this morning, been in the dayroom. I just got back to my room. I slept well last night. I do eat. Patient currently denies any SIHI, AVH, delusional thoughts or paranoia. She does not appear to be responding to any internal stimuli. Staff continues to encourage patient to come out of her room & participate in the group sessions. Discussed this case  with the attending psychiatrist. Continue current plan of car as already in progress.  We are continuing to await DSS, to coordinate with our CSW's to place patient in a safe environment in the community.    Principal Problem: Schizoaffective disorder, depressive type (HCC)   Diagnosis: Principal Problem:   Schizoaffective disorder, depressive type (HCC) Active Problems:   GERD (gastroesophageal reflux disease)   Intellectual disability   Tobacco use disorder  Past Psychiatric History: Patient is not forth coming with the answers to the assessment questions. She is refusing to provide information about her mental health hx or symptoms.   Past Medical History:  Past Medical History:  Diagnosis Date   Bipolar affect, depressed (HCC)    Constipation 08/17/2022   Depression    Falls 07/21/2022   Fracture of femoral neck, right, closed (HCC) 01/17/2022   Herpes simplex 08/22/2017   Open fracture dislocation of right elbow joint 01/17/2022    Past Surgical History:  Procedure Laterality Date   NO PAST SURGERIES     SALPINGECTOMY     Family History: History reviewed. No pertinent family history.  Family Psychiatric  History: See H&P.  Social History:  Social History   Substance and Sexual Activity  Alcohol Use Yes     Social History   Substance and Sexual Activity  Drug Use Yes   Types: Cocaine, Marijuana    Social History   Socioeconomic History   Marital status: Single    Spouse name: Not on file   Number of children: Not  on file   Years of education: Not on file   Highest education level: Not on file  Occupational History   Not on file  Tobacco Use   Smoking status: Every Day   Smokeless tobacco: Not on file  Substance and Sexual Activity   Alcohol use: Yes   Drug use: Yes    Types: Cocaine, Marijuana   Sexual activity: Yes  Other Topics Concern   Not on file  Social History Narrative   Not on file   Social Determinants of Health   Financial  Resource Strain: Low Risk  (09/12/2022)   Received from [facility], [facility]   Overall Financial Resource Strain (CARDIA)    Difficulty of Paying Living Expenses: Not hard at all  Food Insecurity: Patient Declined (12/20/2022)   Hunger Vital Sign    Worried About Running Out of Food in the Last Year: Patient declined    Ran Out of Food in the Last Year: Patient declined  Transportation Needs: No Transportation Needs (12/20/2022)   PRAPARE - Administrator, Civil Service (Medical): No    Lack of Transportation (Non-Medical): No  Physical Activity: Not on file  Stress: No Stress Concern Present (07/17/2022)   Received from [facility], [facility] of Occupational Health - Occupational Stress Questionnaire    Feeling of Stress : Not at all  Social Connections: Unknown (07/16/2022)   Received from [facility], [facility]   Social Network    Social Network: Not on file   Current Medications: Current Facility-Administered Medications  Medication Dose Route Frequency Provider Last Rate Last Admin   acetaminophen (TYLENOL) tablet 650 mg  650 mg Oral Q6H PRN [name], NP   650 mg at 02/04/23 1950   alum & mag hydroxide-simeth (MAALOX/MYLANTA) 200-200-20 MG/5ML suspension 30 mL  30 mL Oral Q4H PRN [name], NP   30 mL at 01/28/23 1530   busPIRone (BUSPAR) tablet 15 mg  15 mg Oral BID [name], NP   15 mg at 02/07/23 0800   cyanocobalamin (VITAMIN B12) injection 1,000 mcg  1,000 mcg Intramuscular Q30 days [name], Nadir, MD   1,000 mcg at 02/02/23 1238   diphenhydrAMINE (BENADRYL) capsule 50 mg  50 mg Oral TID PRN [name], NP   50 mg at 01/15/23 1211   Or   diphenhydrAMINE (BENADRYL) injection 50 mg  50 mg Intramuscular TID PRN [name], NP       feeding supplement (ENSURE ENLIVE / ENSURE PLUS) liquid 237 mL  237 mL Oral TID BM Nkwenti, Doris, NP   237 mL at 02/07/23 1448   haloperidol (HALDOL) tablet 5 mg  5 mg Oral TID PRN  [name], NP   5 mg at 01/15/23 1211   Or   haloperidol lactate (HALDOL) injection 5 mg  5 mg Intramuscular TID PRN [name], NP       hydrOXYzine (ATARAX) tablet 25 mg  25 mg Oral TID PRN [name], DO   25 mg at 02/06/23 1458   LORazepam (ATIVAN) tablet 2 mg  2 mg Oral TID PRN [name], NP   2 mg at 01/15/23 1211   Or   LORazepam (ATIVAN) injection 2 mg  2 mg Intramuscular TID PRN [name], NP       magnesium hydroxide (MILK OF MAGNESIA) suspension 30 mL  30 mL Oral Daily PRN [name], NP       melatonin tablet 5 mg  5 mg Oral QHS Nkwenti, Doris, NP   5 mg at 02/06/23 2131   nicotine (NICODERM CQ - dosed in mg/24 hours) patch 21 mg  21 mg Transdermal Daily [name], DO   21 mg at 02/05/23 0901   paliperidone (INVEGA) 24 hr tablet 6 mg  6 mg Oral Daily [name], NP   6 mg at 02/06/23 2131   pantoprazole (PROTONIX) EC tablet 40 mg  40 mg Oral Daily [name], NP   40 mg at 02/07/23 0800   polyethylene glycol (MIRALAX / GLYCOLAX) packet 17 g  17 g Oral Daily PRN [name], NP       sertraline (ZOLOFT) tablet 150 mg  150 mg Oral Daily Massengill, Nathan, MD   150 mg at 02/07/23 0800   traZODone (DESYREL) tablet 50 mg  50 mg Oral QHS [name], NP   50 mg at 02/06/23 2131   Lab Results:  No results found for this or any previous visit (from the past 48 hour(s)).  Blood Alcohol level:  Lab Results  Component Value Date   ETH <10 12/19/2022   ETH <10 11/21/2019   Metabolic Disorder Labs: Lab Results  Component Value Date   HGBA1C 4.4 (L) 12/21/2022   MPG 79.58 12/21/2022   No results found for: "PROLACTIN" Lab Results  Component Value Date   CHOL 218 (H) 12/21/2022   TRIG 81 12/21/2022   HDL 53 12/21/2022   CHOLHDL 4.1 12/21/2022   VLDL 16 12/21/2022   LDLCALC 149 (H) 12/21/2022   LDLCALC 110 (H) 03/13/2011   Physical Findings: AIMS: Facial and Oral Movements Muscles of Facial Expression: None, normal Lips and Perioral  Area: None, normal Jaw: None, normal Tongue: None, normal,Extremity Movements Upper (arms, wrists, hands, fingers): None, normal Lower (legs, knees, ankles, toes): None, normal, Trunk Movements Neck, shoulders, hips: None, normal, Overall Severity Severity of abnormal movements (highest score from questions above): None, normal Incapacitation due to abnormal movements: None, normal Patient's awareness of abnormal movements (rate only patient's report): No Awareness, Dental Status Current problems with teeth and/or dentures?: No Does patient usually wear dentures?: No   Musculoskeletal: Strength & Muscle Tone: within normal limits Gait & Station: normal Patient leans: N/A  Psychiatric Specialty Exam:  Presentation  General Appearance:  Lying down in bed, better hygiene noted  Eye Contact: Fair Speech: -- (Garbled)  Speech Volume: Decreased  Handedness: Right  Mood and Affect  Mood: Depressed  Affect: Congruent; Flat  Thought Process  Thought Processes: Coherent; Goal Directed  Descriptions of Associations:Intact  Orientation:Partial  Thought Content: Denies SI HI or AVH does not present responding to stimuli. Presentation consistent with moderate intellectual disability  History of Schizophrenia/Schizoaffective disorder:Yes  Duration of Psychotic Symptoms:Greater than six months  Hallucinations:Hallucinations: None Description of Auditory Hallucinations: NA   Ideas of Reference:None  Suicidal Thoughts:Suicidal Thoughts: No SI Active Intent and/or Plan: Without Intent; Without Plan; Without Means to Carry Out; Without Access to Means SI Passive Intent and/or Plan: Without Intent; Without Plan; Without Means to Carry Out   Homicidal Thoughts:Homicidal Thoughts: No   Sensorium  Memory: Immediate Fair; Recent Fair; Remote Fair  Judgment: Limited Insight: Limited Executive Functions  Concentration: Fair  Attention  Span: Fair  Recall: [employer] of Knowledge: Fair  Language: Fair  Psychomotor Activity  Psychomotor Activity: Psychomotor Activity: Normal   Assets  Assets: Communication Skills; Desire for Improvement; Financial Resources/Insurance; Resilience; Social Support  Sleep  Sleep: Sleep: Good Number of Hours of  Sleep: 8   Physical Exam: Physical Exam Vitals and nursing note reviewed.  Constitutional:      General: She is not in acute distress.    Appearance: She is obese. She is not ill-appearing or diaphoretic.  HENT:     Head: Normocephalic.     Comments: Intellectual disability    Nose: No congestion.     Mouth/Throat:     Mouth: Mucous membranes are moist.     Pharynx: Oropharynx is clear.  Eyes:     Pupils: Pupils are equal, round, and reactive to light.  Cardiovascular:     Rate and Rhythm: Normal rate.     Pulses: Normal pulses.  Pulmonary:     Effort: Pulmonary effort is normal. No respiratory distress.  Abdominal:     Comments: Deferred  Genitourinary:    Comments: Deferred Musculoskeletal:        General: Normal range of motion.     Cervical back: Normal range of motion.  Skin:    General: Skin is warm.  Neurological:     General: No focal deficit present.     Mental Status: She is alert.  Psychiatric:        Behavior: Behavior normal.    Blood pressure 98/65, pulse 88, temperature 98.2 F (36.8 C), temperature source Oral, resp. rate 16, height 4\' 11"  (1.499 m), weight 63 kg, SpO2 97%. Body mass index is 28.05 kg/m.  Treatment Plan Summary: Daily contact with patient to assess and evaluate symptoms and progress in treatment and Medication management.   Still waiting on safe disposition as patient would not be able to live independently given cognitive impairment. Recommend assisted living.   No medication side effects.  Because of urinary incontinence, recommended adult diapers to be worn.  Principal/active diagnoses:  Principal  Problem:   Schizoaffective disorder, depressive type (HCC) Active Problems:   GERD (gastroesophageal reflux disease)   Intellectual disability   Tobacco use disorder (MOCA 16/30) moderate cognitive impairment probably related to moderate intellectual disability.  Plan:  -Continue Vitamin D 50.000 units weekly for bone health. -Continue Sertraline150 mg po Q daily for depression/anxiety -Continue Buspar 15 mg po bid for anxiety.  -Continue paliperidone 6 mg po qd for mood stabilization -Continue Trazodone 50 mg nightly for insomnia  -Continue Nicoderm 21 mg topically Q 24 hrs for nicotine withdrawal management. -Continue vitamin B12 1000 mcg IM weekly for 4 weeks then monthly -Continue Melatonin 5 mg nightly for sleep -Continue Protonix EC 40 mg p.o. daily for GERD -Continue MiraLAX 17 g p.o. PRN for constipation -Continue hydroxyzine 25 mg p.o. 3 times daily as needed for anxiety -Continue Ensure nutritional shakes TID in between meals  -Previously discontinued Hydroxyzine 50 mg nightly-hypotension in the mornings  Safety and Monitoring: Voluntary admission to inpatient psychiatric unit for safety, stabilization and treatment Daily contact with patient to assess and evaluate symptoms and progress in treatment Patient's case to be discussed in multi-disciplinary team meeting Observation Level : q15 minute checks Vital signs: q12 hours Precautions: Safety   Discharge Planning: Social work and case management to assist with discharge planning and identification of hospital follow-up needs prior to discharge Estimated LOS: Unknown at this time. Discharge Concerns: Need to establish a safety plan; Medication compliance and effectiveness Discharge Goals: Return home with outpatient referrals for mental health follow-up including medication management/psychotherapy No legal guardian, has payee  Armandina Stammer, NP.Marland Kitchen Pmhnp-bc. 02/07/2023, 3:33 PM Patient ID: Larey Brick, female   DOB:  [birth year], 47 y.o.  MRN: [id number] Patient ID: DEJANE PARRON, female   DOB: 01-13-1976, 47 y.o.   MRN: [id number] Patient ID: KENDYLL KEILMAN, female   DOB: [birth year], 47 y.o.   MRN: [id number]

## 2023-02-07 NOTE — Group Note (Signed)
LCSW Group Therapy Note  Group Date: 02/07/2023 Start Time: 1100 End Time: 1145   Type of Therapy and Topic:  Group Therapy - Healthy vs Unhealthy Coping Skills  Participation Level:  Did Not Attend   Description of Group The focus of this group was to determine what unhealthy coping techniques typically are used by group members and what healthy coping techniques would be helpful in coping with various problems. Patients were guided in becoming aware of the differences between healthy and unhealthy coping techniques. Patients were asked to identify 2-3 healthy coping skills they would like to learn to use more effectively.  Therapeutic Goals Patients learned that coping is what human beings do all day long to deal with various situations in their lives Patients defined and discussed healthy vs unhealthy coping techniques Patients identified their preferred coping techniques and identified whether these were healthy or unhealthy Patients determined 2-3 healthy coping skills they would like to become more familiar with and use more often. Patients provided support and ideas to each other     Therapeutic Modalities Cognitive Behavioral Therapy Motivational Interviewing  Alanda Amass 02/07/2023  12:27 PM

## 2023-02-07 NOTE — Progress Notes (Signed)
   02/07/23 0900  Psych Admission Type (Psych Patients Only)  Admission Status Involuntary  Psychosocial Assessment  Patient Complaints None  Eye Contact Fair  Facial Expression Sad  Affect Sad  Speech Slow  Interaction Childlike  Motor Activity Slow  Appearance/Hygiene Unremarkable  Behavior Characteristics Cooperative  Mood Sad;Preoccupied  Thought Process  Coherency WDL  Content WDL  Delusions None reported or observed  Perception WDL  Hallucination None reported or observed  Judgment Poor  Confusion None  Danger to Self  Current suicidal ideation? Denies  Self-Injurious Behavior No self-injurious ideation or behavior indicators observed or expressed   Agreement Not to Harm Self Yes  Description of Agreement agreed to contact staff before acting on harmful thoughts

## 2023-02-07 NOTE — [facility] Group Notes (Signed)
Adult Psychoeducational Group Note  Date:  02/07/2023 Time:  9:10 PM  Group Topic/Focus:  Wrap-Up Group:   The focus of this group is to help patients review their daily goal of treatment and discuss progress on daily workbooks.  Participation Level:  Active  Participation Quality:  Appropriate  Affect:  Appropriate  Cognitive:  Appropriate  Insight: Good  Engagement in Group:  Engaged  Modes of Intervention:  Discussion  Additional Comments:   Pt states that she had a good day and was able to speak with her aunt today. Pt did say she was feeling down today but came down to groups and meals and has been feeling better than usual. Pt rates depression at a 6  Christianjames Soule A Melaina Howerton 02/07/2023, 9:10 PM

## 2023-02-07 NOTE — Plan of Care (Signed)
  Problem: Education: Goal: Utilization of techniques to improve thought processes will improve Outcome: Progressing Goal: Knowledge of the prescribed therapeutic regimen will improve Outcome: Progressing   Problem: Activity: Goal: Interest or engagement in leisure activities will improve Outcome: Progressing Goal: Imbalance in normal sleep/wake cycle will improve Outcome: Progressing   

## 2023-02-07 NOTE — Progress Notes (Signed)
D. Pt continues to present the same, calm and cooperative- isolates to room, but will come out with a moderate amount of prompting. Pt usually observed laying in her bed with word search book and bible next to her. Pt also needs prompting with hygiene, and requires assistance turning on her shower.  Pt denies SI/HI and AVH and does not appear to be responding to internal stimuli. A. Labs and vitals monitored. Pt given and educated on medications. Pt supported emotionally and encouraged to express concerns and ask questions.   R. Pt remains safe with 15 minute checks. Will continue POC.

## 2023-02-08 DIAGNOSIS — F251 Schizoaffective disorder, depressive type: Secondary | ICD-10-CM | POA: Diagnosis not present

## 2023-02-08 NOTE — Progress Notes (Signed)
D: Patient alert and oriented, able to make needs known. Denies SI/HI, AVH at present. Denies pain at present. Patient goal today "group, stay out of room." Rates depression 3/10, hopelessness 1/10, and anxiety 3/10. Patient reports energy level as low. She reports she slept good last night. Patient does not request any PRN medication at this time.   A: Scheduled medications administered to patient per MD order. Support and encouragement provided. Routine safety checks conducted every fifteen minutes. Patient informed to notify staff with problems or concerns. Frequent verbal contact made.   R: No adverse drug reactions noted. Patient contracts for safety at this time. Patient is compliant with medications and treatment plan. Patient receptive, calm and cooperative. Patient interacts with others appropriately on unit at present. Patient remains safe at present.

## 2023-02-08 NOTE — Progress Notes (Signed)
Patient ID: Yolanda [name], female   DOB: 1975/10/04, 47 y.o.   MRN: [id number] Yolanda [name]  02/08/2023 4:08 PM Yolanda [name]  MRN:  [id number]  Reason for admission: This is the first psychiatric admission in this [facility] in 12 years for this 20 AA female with an extensive hx of mental illnesses & probable polysubstance use disorders. She is admitted to the [facility] from the [facility] hospital with complain of worsening suicidal ideations with plan to stab herself. Per chart review, patient apparently reported at the ED that she has been depressed for a while & has not been taking her mental health medications. After medical evaluation.clearance, she was transferred to the [facility] for further psychiatric evaluation/treatments.   APS report on 12/31/22 Has payee, no legal guardian Continues to await placement.  24 hr chart review: Sleep Hours last night: Good. Nursing Concerns: isolative to room, engaging in activities with encouragement. Behavioral episodes in the past 24 hrs:as above  Medication Compliance: compliant  Vital Signs in the past 24 hrs: Stable.  Daily notes: Yolanda [name] is seen in an office today instead of her room. She is seen attending group sessions. She presents with a reactive affect, making a fair eye contact. She is verbally responsive, talking/having discussions with the staff. Her speech remains garbled most of the time. During this evaluation, she reports, "I'm doing good. I slept okay last night. I have no symptoms of depression or anxiety today. I'm taking my medicines, no side effects". Yolanda [name] currently denies any SIHI, AVH, delusional thoughts or paranoia. She does not appear to be responding to any internal stimuli. She is attending group sessions. There are no changes made on the current plan of care, will continue as already in progress. Vital signs reviewed, b/p 132/91. Staff will recheck later..  We are continuing to await DSS, to coordinate with our CSW's to  place patient in a safe environment in the community.    Principal Problem: Schizoaffective disorder, depressive type (HCC)   Diagnosis: Principal Problem:   Schizoaffective disorder, depressive type (HCC) Active Problems:   GERD (gastroesophageal reflux disease)   Intellectual disability   Tobacco use disorder  Past Psychiatric History: Patient is not forth coming with the answers to the assessment questions. She is refusing to provide information about her mental health hx or symptoms.   Past Medical History:  Past Medical History:  Diagnosis Date   Bipolar affect, depressed (HCC)    Constipation 08/17/2022   Depression    Falls 07/21/2022   Fracture of femoral neck, right, closed (HCC) 01/17/2022   Herpes simplex 08/22/2017   Open fracture dislocation of right elbow joint 01/17/2022    Past Surgical History:  Procedure Laterality Date   NO PAST SURGERIES     SALPINGECTOMY     Family History: History reviewed. No pertinent family history.  Family Psychiatric  History: See H&P.  Social History:  Social History   Substance and Sexual Activity  Alcohol Use Yes     Social History   Substance and Sexual Activity  Drug Use Yes   Types: Cocaine, Marijuana    Social History   Socioeconomic History   Marital status: Single    Spouse name: Not on file   Number of children: Not on file   Years of education: Not on file   Highest education level: Not on file  Occupational History   Not on file  Tobacco Use   Smoking status: Every Day   Smokeless tobacco:  Not on file  Substance and Sexual Activity   Alcohol use: Yes   Drug use: Yes    Types: Cocaine, Marijuana   Sexual activity: Yes  Other Topics Concern   Not on file  Social History Narrative   Not on file   Social Determinants of Health   Financial Resource Strain: Low Risk  (09/12/2022)   Received from [facility]   Overall Financial Resource Strain (CARDIA)    Difficulty of Paying  Living Expenses: Not hard at all  Food Insecurity: Patient Declined (12/20/2022)   Hunger Vital Sign    Worried About Running Out of Food in the Last Year: Patient declined    Ran Out of Food in the Last Year: Patient declined  Transportation Needs: No Transportation Needs (12/20/2022)   PRAPARE - Administrator, Civil Service (Medical): No    Lack of Transportation (Non-Medical): No  Physical Activity: Not on file  Stress: No Stress Concern Present (07/17/2022)   Received from [facility], [facility] of Occupational Health - Occupational Stress Questionnaire    Feeling of Stress : Not at all  Social Connections: Unknown (07/16/2022)   Received from [facility]   Social Network    Social Network: Not on file   Current Medications: Current Facility-Administered Medications  Medication Dose Route Frequency Provider Last Rate Last Admin   acetaminophen (TYLENOL) tablet 650 mg  650 mg Oral Q6H PRN [name], Yolanda [name]   650 mg at 02/04/23 1950   alum & mag hydroxide-simeth (MAALOX/MYLANTA) 200-200-20 MG/5ML suspension 30 mL  30 mL Oral Q4H PRN [name], Yolanda [name]   30 mL at 01/28/23 1530   busPIRone (BUSPAR) tablet 15 mg  15 mg Oral BID Yolanda [name] I, Yolanda [name]   15 mg at 02/08/23 9562   cyanocobalamin (VITAMIN B12) injection 1,000 mcg  1,000 mcg Intramuscular Q30 days Sarita Bottom, MD   1,000 mcg at 02/02/23 1238   diphenhydrAMINE (BENADRYL) capsule 50 mg  50 mg Oral TID PRN [name], Yolanda [name]   50 mg at 01/15/23 1211   Or   diphenhydrAMINE (BENADRYL) injection 50 mg  50 mg Intramuscular TID PRN [name], Yolanda [name]       feeding supplement (ENSURE ENLIVE / ENSURE PLUS) liquid 237 mL  237 mL Oral TID BM Nkwenti, Doris, Yolanda [name]   237 mL at 02/08/23 1355   haloperidol (HALDOL) tablet 5 mg  5 mg Oral TID PRN Yolanda [name] I, Yolanda [name]   5 mg at 01/15/23 1211   Or   haloperidol lactate (HALDOL) injection 5 mg  5 mg Intramuscular TID PRN Yolanda [name] I, Yolanda [name]        hydrOXYzine (ATARAX) tablet 25 mg  25 mg Oral TID PRN [name], DO   25 mg at 02/06/23 1458   LORazepam (ATIVAN) tablet 2 mg  2 mg Oral TID PRN [name], Yolanda [name]   2 mg at 01/15/23 1211   Or   LORazepam (ATIVAN) injection 2 mg  2 mg Intramuscular TID PRN [name], Yolanda [name]       magnesium hydroxide (MILK OF MAGNESIA) suspension 30 mL  30 mL Oral Daily PRN [name], Yolanda [name]       melatonin tablet 5 mg  5 mg Oral QHS Nkwenti, Doris, Yolanda [name]   5 mg at 02/07/23 2109   nicotine (NICODERM CQ - dosed in mg/24 hours) patch 21 mg  21 mg Transdermal Daily [name], DO   21  mg at 02/08/23 0981   paliperidone (INVEGA) 24 hr tablet 6 mg  6 mg Oral Daily [name], Yolanda [name]   6 mg at 02/07/23 2109   pantoprazole (PROTONIX) EC tablet 40 mg  40 mg Oral Daily Autum Benfer, Nicole Kindred I, Yolanda [name]   40 mg at 02/08/23 1914   polyethylene glycol (MIRALAX / GLYCOLAX) packet 17 g  17 g Oral Daily PRN [name], Yolanda [name]       sertraline (ZOLOFT) tablet 150 mg  150 mg Oral Daily Massengill, Nathan, MD   150 mg at 02/08/23 7829   traZODone (DESYREL) tablet 50 mg  50 mg Oral QHS [name], Yolanda [name]   50 mg at 02/07/23 2110   Lab Results:  No results found for this or any previous visit (from the past 48 hour(s)).  Blood Alcohol level:  Lab Results  Component Value Date   ETH <10 12/19/2022   ETH <10 11/21/2019   Metabolic Disorder Labs: Lab Results  Component Value Date   HGBA1C 4.4 (L) 12/21/2022   MPG 79.58 12/21/2022   No results found for: "PROLACTIN" Lab Results  Component Value Date   CHOL 218 (H) 12/21/2022   TRIG 81 12/21/2022   HDL 53 12/21/2022   CHOLHDL 4.1 12/21/2022   VLDL 16 12/21/2022   LDLCALC 149 (H) 12/21/2022   LDLCALC 110 (H) 03/13/2011   Physical Findings: AIMS: Facial and Oral Movements Muscles of Facial Expression: None, normal Lips and Perioral Area: None, normal Jaw: None, normal Tongue: None, normal,Extremity Movements Upper (arms, wrists, hands, fingers): None, normal Lower (legs,  knees, ankles, toes): None, normal, Trunk Movements Neck, shoulders, hips: None, normal, Overall Severity Severity of abnormal movements (highest score from questions above): None, normal Incapacitation due to abnormal movements: None, normal Patient's awareness of abnormal movements (rate only patient's report): No Awareness, Dental Status Current problems with teeth and/or dentures?: No Does patient usually wear dentures?: No   Musculoskeletal: Strength & Muscle Tone: within normal limits Gait & Station: normal Patient leans: N/A  Psychiatric Specialty Exam:  Presentation  General Appearance:  Lying down in bed, better hygiene noted  Eye Contact: Fair Speech: -- (Garbled)  Speech Volume: Decreased  Handedness: Right  Mood and Affect  Mood: Depressed  Affect: Congruent; Flat  Thought Process  Thought Processes: Coherent; Goal Directed  Descriptions of Associations:Intact  Orientation:Partial  Thought Content: Denies SI HI or AVH does not present responding to stimuli. Presentation consistent with moderate intellectual disability  History of Schizophrenia/Schizoaffective disorder:Yes  Duration of Psychotic Symptoms:Greater than six months  Hallucinations:Hallucinations: None Description of Auditory Hallucinations: NA  Ideas of Reference:None  Suicidal Thoughts:Suicidal Thoughts: No SI Active Intent and/or Plan: Without Intent; Without Plan; Without Means to Carry Out; Without Access to Means SI Passive Intent and/or Plan: Without Intent; Without Plan; Without Means to Carry Out  Homicidal Thoughts:Homicidal Thoughts: No  Sensorium  Memory: Immediate Fair; Recent Fair; Remote Fair  Judgment: Limited Insight: Limited Executive Functions  Concentration: Fair  Attention Span: Fair  Recall: [employer] of Knowledge: Fair  Language: Fair  Psychomotor Activity  Psychomotor Activity: Psychomotor Activity: Normal  Assets   Assets: Communication Skills; Desire for Improvement; Financial Resources/Insurance; Resilience; Social Support  Sleep  Sleep: Sleep: Good Number of Hours of Sleep: 8  Physical Exam: Physical Exam Vitals and nursing [name] reviewed.  Constitutional:      General: She is not in acute distress.    Appearance: She is obese. She is not ill-appearing or diaphoretic.  HENT:  Head: Normocephalic.     Comments: Intellectual disability    Nose: No congestion.     Mouth/Throat:     Mouth: Mucous membranes are moist.     Pharynx: Oropharynx is clear.  Eyes:     Pupils: Pupils are equal, round, and reactive to light.  Cardiovascular:     Rate and Rhythm: Normal rate.     Pulses: Normal pulses.  Pulmonary:     Effort: Pulmonary effort is normal. No respiratory distress.  Abdominal:     Comments: Deferred  Genitourinary:    Comments: Deferred Musculoskeletal:        General: Normal range of motion.     Cervical back: Normal range of motion.  Skin:    General: Skin is warm.  Neurological:     General: No focal deficit present.     Mental Status: She is alert.  Psychiatric:        Behavior: Behavior normal.    Blood pressure (!) 134/91, pulse 79, temperature 98.2 F (36.8 C), temperature source Oral, resp. rate 16, height 4\' 11"  (1.499 m), weight 63 kg, SpO2 95%. Body mass index is 28.05 kg/m.  Treatment Plan Summary: Daily contact with patient to assess and evaluate symptoms and progress in treatment and Medication management.   Still waiting on safe disposition as patient would not be able to live independently given cognitive impairment. Recommend assisted living.   No medication side effects.  Because of urinary incontinence, recommended adult diapers to be worn.  Principal/active diagnoses:  Principal Problem:   Schizoaffective disorder, depressive type (HCC) Active Problems:   GERD (gastroesophageal reflux disease)   Intellectual disability   Tobacco use  disorder (MOCA 16/30) moderate cognitive impairment probably related to moderate intellectual disability.  Plan:  -Continue Vitamin D 50.000 units weekly for bone health. -Continue Sertraline150 mg po Q daily for depression/anxiety -Continue Buspar 15 mg po bid for anxiety.  -Continue paliperidone 6 mg po qd for mood stabilization -Continue Trazodone 50 mg nightly for insomnia  -Continue Nicoderm 21 mg topically Q 24 hrs for nicotine withdrawal management. -Continue vitamin B12 1000 mcg IM weekly for 4 weeks then monthly -Continue Melatonin 5 mg nightly for sleep -Continue Protonix EC 40 mg p.o. daily for GERD -Continue MiraLAX 17 g p.o. PRN for constipation -Continue hydroxyzine 25 mg p.o. 3 times daily as needed for anxiety -Continue Ensure nutritional shakes TID in between meals  -Previously discontinued Hydroxyzine 50 mg nightly-hypotension in the mornings  Safety and Monitoring: Voluntary admission to inpatient psychiatric unit for safety, stabilization and treatment Daily contact with patient to assess and evaluate symptoms and progress in treatment Patient's case to be discussed in multi-disciplinary team meeting Observation Level : q15 minute checks Vital signs: q12 hours Precautions: Safety   Discharge Planning: Social work and case management to assist with discharge planning and identification of hospital follow-up needs prior to discharge Estimated LOS: Unknown at this time. Discharge Concerns: Need to establish a safety plan; Medication compliance and effectiveness Discharge Goals: Return home with outpatient referrals for mental health follow-up including medication management/psychotherapy No legal guardian, has payee  Yolanda Stammer, Yolanda [name].Marland Kitchen [id number]. 02/08/2023, 4:08 PM Patient ID: Yolanda [name], female   DOB: [birth year], 46 y.o.    MRN: [id number] Patient ID: Yolanda [name], female   DOB: [birth year], 47 y.o.   MRN: [id number] Patient ID: Yolanda [name], female    DOB: 06-Apr-1976, 47 y.o.   MRN: [id number] Patient ID: Yolanda [name], female   DOB:  [birth year], 47 y.o.   MRN: [id number]

## 2023-02-08 NOTE — Group Note (Signed)
Recreation Therapy Group Note   Group Topic:Problem Solving  Group Date: 02/08/2023 Start Time: 1610 End Time: 1005 Facilitators: Adonus Uselman-McCall, LRT,CTRS Location: [address] Dayroom   Goal Area(s) Addresses:  Patient will effectively work in a team with other group members. Patient will verbalize importance of using appropriate problem solving techniques.  Patient will identify positive change associated with effective problem solving skills.    Group Description: Brain Teasers. Patients form groups of no more than 3 people. Patients given two sheets of brain teasers each. Patients work together to figure out the answers to each puzzle. Patients given 20 minutes to work on the brain teasers. LRT will go over the final answers with patients.   Affect/Mood: Appropriate   Participation Level: None   Participation Quality: None   Behavior: Attentive    Speech/Thought Process: Barely audible    Insight: None   Judgement: None   Modes of Intervention: Worksheet   Patient Response to Interventions:  Attentive   Education Outcome:  In group clarification offered    Clinical Observations/Individualized Feedback: Pt was observant and attentive.     Plan: Continue to engage patient in RT group sessions 2-3x/week.   Vollie Brunty-McCall, LRT,CTRS 02/08/2023 12:34 PM

## 2023-02-08 NOTE — Progress Notes (Signed)
   02/08/23 0740  Sleep (Behavioral Health Patients Only)  Documented sleep last 24 hours 9.75

## 2023-02-08 NOTE — Progress Notes (Signed)
Chaplain met with Yolanda [name] for ongoing support.  She was tearful and feels she has been here for a long time.  Chaplain provided listening and support.  She requested a Bible and chaplain will bring tomorrow.

## 2023-02-08 NOTE — Progress Notes (Signed)
Chaplain followed up with Hayla and brought her a Bible at her request. Chaplain read aloud some passages with Nadiyah and offered prayer as well.  Yolanda [name] is ready to leave the hospital and feels regret about things she did that led to this hospitalization.  She was very tearful.  She appreciated the prayer and afterwards, appeared in better spirits.  She told chaplain about her family and her love for her daughters and grandson. Chaplain provided listening as well as spiritual and emotional support.

## 2023-02-08 NOTE — [facility] Group Notes (Signed)
[facility] Group Notes:  (Nursing/MHT/Case Management/Adjunct)  Date:  02/08/2023  Time:  8:43 PM  Type of Therapy:   AA Group  Participation Level:  Active  Participation Quality:  Appropriate  Affect:  Appropriate  Cognitive:  Appropriate  Insight:  Appropriate  Engagement in Group:  Engaged  Modes of Intervention:  Education  Summary of Progress/Problems: Attended AA meeting.  Yolanda [name] 02/08/2023, 8:43 PM

## 2023-02-08 NOTE — Plan of Care (Signed)

## 2023-02-08 NOTE — [facility] Group Notes (Signed)
[facility] Group Notes: (Healthy Relationships)  Date: 02/08/23  Time: 2:45pm-3:00pm  Group Topic: Healthy Relationships: the focus of this group is to help patients identify healthy relationships versus unhealthy relationships characteristics.   Participation Level: Active  Participation Quality: Appropriate  Affect: Appropriate  Cognitive: Appropriate  Insight: Appropriate  Engagement in group: Engaged  Modes of intervention: Discussion  Summary of Progress/Problems: Patient attended and participated in group today.   Harless Litten RN  02/08/23 3:24pm

## 2023-02-08 NOTE — [facility] Group Notes (Signed)
Adult Psychoeducational Group Note  Date:  02/08/2023 Time:  10:46 AM  Group Topic/Focus:  Dimensions of Wellness:   The focus of this group is to introduce the topic of wellness and discuss the role each dimension of wellness plays in total health. Goals Group:   The focus of this group is to help patients establish daily goals to achieve during treatment and discuss how the patient can incorporate goal setting into their daily lives to aide in recovery.  Participation Level:  Active  Participation Quality:  Attentive  Affect:  Appropriate  Cognitive:  Appropriate  Insight: Appropriate  Engagement in Group:  Engaged  Modes of Intervention:  Discussion and Exploration  Additional Comments:  Pt participated in group. Pt identified her goal for today is to attend groups socialize. Facilitator provided group with "Who am I? Who do I want to be? Worksheets. Pt actively participated in discussion and topic of self-exploration.    Chianti Goh 02/08/2023, 10:46 AM

## 2023-02-09 ENCOUNTER — Encounter (HOSPITAL_COMMUNITY): Payer: Self-pay | Admitting: Psychiatry

## 2023-02-09 DIAGNOSIS — F251 Schizoaffective disorder, depressive type: Secondary | ICD-10-CM | POA: Diagnosis not present

## 2023-02-09 NOTE — Progress Notes (Signed)

## 2023-02-09 NOTE — [facility] Group Notes (Signed)
[facility] Group Notes:  (Nursing/MHT/Case Management/Adjunct)  Date:  02/09/2023  Time:  8:57 PM  Type of Therapy:   wrap-up group  Participation Level:  Did Not Attend  Participation Quality:    Affect:    Cognitive:    Insight:    Engagement in Group:    Modes of Intervention:    Summary of Progress/Problems: didn't attend.   Yolanda [name] 02/09/2023, 8:57 PM

## 2023-02-09 NOTE — Progress Notes (Addendum)
Pt had been observed coloring in the dayroom prior to assessment. MHT reported to this RN that the pt went to her room and became tearful. Pts speech was incoherent, gave pt PRN see MAR, but ultimately pt feels guilty about something she did in the past and currently insecure about herself and also shares she has racing thoughts. Pt states "I want to do better" and shares she has been reading her bible. Pt did not attend group due to her tearful state but did go to the dayroom for a few minutes once stable and offered snacks. Pt shares she has pain in bilateral knuckles/dorsal side of hand, states pain is 10/10, pt given PRN for pain. Pt denies SI/HI/AVH and verbally contracts for safety. Provided support and encouragement. Pt safe on the unit. Q 15 minute safety checks continued.

## 2023-02-09 NOTE — Progress Notes (Signed)
D: Patient alert and oriented. Affect/mood reported as improving. Denies SI, HI, AVH, and pain. Patient goal, "to stay out of my room."   A: Scheduled medication administered to patient, per MD orders. Support and encouragement provided. Routine safety checks conducted every 15 minutes. Patient informed to notify staff with problems or concerns.   R: No adverse drug reactions noted. Patient contracts for safety at this time. Patient compliant with medications and treatment plan. Patient receptive, calm and cooperative. Patient interacts well with others on unit. Patient remains safe at this time.

## 2023-02-09 NOTE — Group Note (Signed)
Date:  02/09/2023 Time:  3:27 PM  Group Topic/Focus:  Developing a Wellness Toolbox:   The focus of this group is to help patients develop a "wellness toolbox" with skills and strategies to promote recovery upon discharge. Dimensions of Wellness:   The focus of this group is to introduce the topic of wellness and discuss the role each dimension of wellness plays in total health.    Participation Level:  Active  Participation Quality:  Appropriate  Affect:  Appropriate  Cognitive:  Alert and Appropriate  Insight: Appropriate  Engagement in Group:  Engaged  Modes of Intervention:  Discussion and Education  Additional Comments:  n/a  Cherre Blanc 02/09/2023, 3:27 PM

## 2023-02-09 NOTE — Progress Notes (Signed)
[facility] MD Progress Note  02/09/2023 10:56 AM Yolanda [name]  MRN:  [id number]   Subjective:  Yolanda [name] 47 y/o female with an extensive hx of mental illnesses & probable polysubstance use disorders. She was admitted to [facility] from [facility] with complaints of depression, not taking her mental medication, and suicidal ideation with plan to stab herself.   Patient seen face to face by this provider, chart reviewed and consulted with Dr. Phineas Inches on 02/09/23.  On evaluation Yolanda [name] is lying in bed, disheveled, and smelling like urine.  She responds with mostly yes or no verbally or with the shake of her head.  She denies suicidal/self-harm/homicidal ideation, psychosis, and paranoia.  She reports she is tolerating medications without adverse reaction, eating and sleeping without difficult.  She responds yes when asked if she has been attending group sessions.  Spoke to sitter who reports that patient usually does well once she is out of her room.  States that patient urinated on herself this morning and needed help getting herself and room cleaned.  States she will continue to encourage patient to get out of bed and go to group session today.    Principal Problem: Schizoaffective disorder, depressive type (HCC) Diagnosis: Principal Problem:   Schizoaffective disorder, depressive type (HCC) Active Problems:   GERD (gastroesophageal reflux disease)   Intellectual disability   Tobacco use disorder  Total Time spent with patient: 20 minutes  Past Psychiatric History:  Unaware, no information given  Past Medical History:  Past Medical History:  Diagnosis Date   Bipolar affect, depressed (HCC)    Constipation 08/17/2022   Depression    Falls 07/21/2022   Fracture of femoral neck, right, closed (HCC) 01/17/2022   Herpes simplex 08/22/2017   Open fracture dislocation of right elbow joint 01/17/2022    Past Surgical History:  Procedure Laterality Date   NO  PAST SURGERIES     SALPINGECTOMY     Family History: History reviewed. No pertinent family history. Family Psychiatric  History: History reviewed. No pertinent family history. Unaware Social History:  Social History   Substance and Sexual Activity  Alcohol Use Yes     Social History   Substance and Sexual Activity  Drug Use Yes   Types: Cocaine, Marijuana    Social History   Socioeconomic History   Marital status: Single    Spouse name: Not on file   Number of children: Not on file   Years of education: Not on file   Highest education level: Not on file  Occupational History   Not on file  Tobacco Use   Smoking status: Every Day   Smokeless tobacco: Not on file  Substance and Sexual Activity   Alcohol use: Yes   Drug use: Yes    Types: Cocaine, Marijuana   Sexual activity: Yes  Other Topics Concern   Not on file  Social History Narrative   Not on file   Social Determinants of Health   Financial Resource Strain: Low Risk  (09/12/2022)   Received [facility]   Overall Financial Resource Strain (CARDIA)    Difficulty of Paying Living Expenses: Not hard at all  Food Insecurity: Patient Declined (12/20/2022)   Hunger Vital Sign    Worried About Running Out of Food in the Last Year: Patient declined    Ran Out of Food in the Last Year: Patient declined  Transportation Needs: No Transportation Needs (12/20/2022)   PRAPARE -  Administrator, Civil Service (Medical): No    Lack of Transportation (Non-Medical): No  Physical Activity: Not on file  Stress: No Stress Concern Present (07/17/2022)   Received from [facility], [facility] of Occupational Health - Occupational Stress Questionnaire    Feeling of Stress : Not at all  Social Connections: Unknown (07/16/2022)   Received from [facility], [facility]   Social Network    Social Network: Not on file   Additional Social History:                          Sleep: Good  Appetite:  Good  Current Medications: Current Facility-Administered Medications  Medication Dose Route Frequency Provider Last Rate Last Admin   acetaminophen (TYLENOL) tablet 650 mg  650 mg Oral Q6H PRN [name], NP   650 mg at 02/04/23 1950   alum & mag hydroxide-simeth (MAALOX/MYLANTA) 200-200-20 MG/5ML suspension 30 mL  30 mL Oral Q4H PRN [name], NP   30 mL at 01/28/23 1530   busPIRone (BUSPAR) tablet 15 mg  15 mg Oral BID [name], NP   15 mg at 02/09/23 0804   cyanocobalamin (VITAMIN B12) injection 1,000 mcg  1,000 mcg Intramuscular Q30 days [name], Nadir, MD   1,000 mcg at 02/02/23 1238   diphenhydrAMINE (BENADRYL) capsule 50 mg  50 mg Oral TID PRN [name], NP   50 mg at 01/15/23 1211   Or   diphenhydrAMINE (BENADRYL) injection 50 mg  50 mg Intramuscular TID PRN [name], NP       feeding supplement (ENSURE ENLIVE / ENSURE PLUS) liquid 237 mL  237 mL Oral TID BM Nkwenti, Doris, NP   237 mL at 02/08/23 2114   haloperidol (HALDOL) tablet 5 mg  5 mg Oral TID PRN [name], NP   5 mg at 01/15/23 1211   Or   haloperidol lactate (HALDOL) injection 5 mg  5 mg Intramuscular TID PRN [name], NP       hydrOXYzine (ATARAX) tablet 25 mg  25 mg Oral TID PRN [name], DO   25 mg at 02/08/23 1822   LORazepam (ATIVAN) tablet 2 mg  2 mg Oral TID PRN [name], NP   2 mg at 01/15/23 1211   Or   LORazepam (ATIVAN) injection 2 mg  2 mg Intramuscular TID PRN [name], NP       magnesium hydroxide (MILK OF MAGNESIA) suspension 30 mL  30 mL Oral Daily PRN [name], NP       melatonin tablet 5 mg  5 mg Oral QHS Nkwenti, Doris, NP   5 mg at 02/08/23 2114   nicotine (NICODERM CQ - dosed in mg/24 hours) patch 21 mg  21 mg Transdermal Daily [name], DO   21 mg at 02/08/23 6440   paliperidone (INVEGA) 24 hr tablet 6 mg  6 mg Oral Daily [name], NP   6 mg at 02/08/23 2114   pantoprazole (PROTONIX) EC tablet 40 mg  40 mg  Oral Daily [name], NP   40 mg at 02/09/23 0805   polyethylene glycol (MIRALAX / GLYCOLAX) packet 17 g  17 g Oral Daily PRN [name], NP       sertraline (ZOLOFT) tablet 150 mg  150 mg Oral Daily Massengill, Harrold Donath, MD   150 mg at 02/09/23 0804   traZODone (DESYREL) tablet 50 mg  50 mg Oral QHS [name], NP   50 mg at 02/08/23 2114    Lab Results: No results found for this or any previous visit (from the past 48 hour(s)).  Blood Alcohol level:  Lab Results  Component Value Date   ETH <10 12/19/2022   ETH <10 11/21/2019    Metabolic Disorder Labs: Lab Results  Component Value Date   HGBA1C 4.4 (L) 12/21/2022   MPG 79.58 12/21/2022   No results found for: "PROLACTIN" Lab Results  Component Value Date   CHOL 218 (H) 12/21/2022   TRIG 81 12/21/2022   HDL 53 12/21/2022   CHOLHDL 4.1 12/21/2022   VLDL 16 12/21/2022   LDLCALC 149 (H) 12/21/2022   LDLCALC 110 (H) 03/13/2011    Physical Findings: AIMS: Facial and Oral Movements Muscles of Facial Expression: None, normal Lips and Perioral Area: None, normal Jaw: None, normal Tongue: None, normal,Extremity Movements Upper (arms, wrists, hands, fingers): None, normal Lower (legs, knees, ankles, toes): None, normal, Trunk Movements Neck, shoulders, hips: None, normal, Overall Severity Severity of abnormal movements (highest score from questions above): None, normal Incapacitation due to abnormal movements: None, normal Patient's awareness of abnormal movements (rate only patient's report): No Awareness, Dental Status Current problems with teeth and/or dentures?: No Does patient usually wear dentures?: No  CIWA:    COWS:     Musculoskeletal: Strength & Muscle Tone: within normal limits Gait & Station: normal Patient leans: N/A  Psychiatric Specialty Exam:  Presentation  General Appearance:  Disheveled (Odorous)  Eye Contact: Good  Speech: Clear and Coherent  Speech  Volume: Decreased  Handedness: Right   Mood and Affect  Mood: Depressed  Affect: Depressed; Flat   Thought Process  Thought Processes: Coherent; Linear  Descriptions of Associations:Intact  Orientation:Partial  Thought Content:Other (comment) (liner, mostly yes or no answers.)  History of Schizophrenia/Schizoaffective disorder:Yes  Duration of Psychotic Symptoms:Greater than six months  Hallucinations:Hallucinations: None  Ideas of Reference:None  Suicidal Thoughts:Suicidal Thoughts: No  Homicidal Thoughts:Homicidal Thoughts: No   Sensorium  Memory: Immediate Fair; Recent Fair; Remote Fair  Judgment: -- (Patient is limited intellectually)  Insight: Lacking (limited)   Art therapist  Concentration: Fair  Attention Span: Fair  Recall: [employer] of Knowledge: Fair  Language: Fair   Psychomotor Activity  Psychomotor Activity: Psychomotor Activity: Normal   Assets  Assets: Manufacturing systems engineer; Desire for Improvement; Financial Resources/Insurance; Leisure Time   Sleep  Sleep: Sleep: Good    Physical Exam: Physical Exam Vitals and nursing note reviewed. Exam conducted with a chaperone present.  Constitutional:      General: She is not in acute distress.    Appearance: Normal appearance. She is not ill-appearing.  HENT:     Head: Normocephalic.  Eyes:     Conjunctiva/sclera: Conjunctivae normal.  Cardiovascular:     Rate and Rhythm: Normal rate.  Pulmonary:     Effort: Pulmonary effort is normal. No respiratory distress.  Musculoskeletal:        General: Normal range of motion.  Skin:    General: Skin is warm and dry.  Neurological:     Mental Status: She is alert and oriented to person, place, and time.  Psychiatric:        Attention and Perception: Attention and perception normal. She does not perceive auditory or visual hallucinations.        Mood and Affect: Mood is depressed. Affect is flat.        Speech:  Speech normal.  Behavior: Behavior is withdrawn. Behavior is cooperative.        Thought Content: Thought content is not paranoid or delusional. Thought content does not include homicidal or suicidal ideation.    Review of Systems  Constitutional:        No other complaints voiced  Psychiatric/Behavioral:  Positive for depression. Negative for hallucinations and suicidal ideas. The patient is nervous/anxious. The patient does not have insomnia.   All other systems reviewed and are negative.  Blood pressure 110/85, pulse 95, temperature 98.3 F (36.8 C), temperature source Oral, resp. rate 19, height 4\' 11"  (1.499 m), weight 63 kg, SpO2 100%. Body mass index is 28.05 kg/m.   Treatment Plan Summary: Daily contact with patient to assess and evaluate symptoms and progress in treatment and Medication management  Plan: No changes in current plan  Continue to wait for appropriate placement.  Social work/TOC and DSS continue to look for appropriate place for patient.   Medication Management:   -Continue Sertraline 150 mg po Q daily for depression/anxiety -Continue Buspar 15 mg po bid for anxiety.  -Continue Paliperidone 6 mg po qd for mood stabilization -Continue Trazodone 50 mg nightly for insomnia  -Continue Nicoderm 21 mg topically Q 24 hrs for nicotine withdrawal management. -Continue vitamin B12 1000 mcg IM weekly for 4 weeks then monthly -Continue Melatonin 5 mg nightly for sleep -Continue Protonix EC 40 mg p.o. daily for GERD -Continue MiraLAX 17 g p.o. PRN for constipation -Continue hydroxyzine 25 mg p.o. 3 times daily as needed for anxiety -Continue Ensure nutritional shakes TID in between meals  -Previously discontinued Hydroxyzine 50 mg nightly-hypotension in the mornings  Ella Guillotte, NP 02/09/2023, 10:56 AM

## 2023-02-10 DIAGNOSIS — F251 Schizoaffective disorder, depressive type: Secondary | ICD-10-CM | POA: Diagnosis not present

## 2023-02-10 NOTE — Group Note (Signed)
LCSW Group Therapy Note   Group Date: 02/10/2023 Start Time: 1000 End Time: 1100   Type of Therapy and Topic:  Group Therapy: Gratitude  Participation Level:  Minimal   Description of Group:   In this group, patients shared and discussed the importance of acknowledging the elements in their lives for which they are grateful and how this can positively impact their mood.  The group discussed how bringing the positive elements of their lives to the forefront of their minds can help with recovery from any illness, physical or mental.  An Gratitude exercise was done as a group in which a list was made of songs used to  encourage participants to consider other potential positives in their lives.  Therapeutic Goals: Patients will identify one or more item for which they are grateful in each of 6 categories:  people, experiences, things, places, skills, and other. Patients will discuss how it is possible to seek out gratitude in even bad situations. Patients will explore other possible items of gratitude that they could remember.   Summary of Patient Progress:  The patient shared that she is grateful for her 49 yr old grandson, wants to be in his life.  Patient had a good reaction in group. Intrigued by discussion and clapped at the end.   Therapeutic Modalities:   Solution-Focused Therapy Activity  Steffanie Dunn, LCSWA 02/10/2023  1:56 PM

## 2023-02-10 NOTE — Progress Notes (Signed)
D: Patient alert and oriented. Affect/mood reported as improving. Denies SI, HI, AVH, and pain. Patient goal, "to go out of my room."   A: Scheduled medication administered to patient, per MD orders. Support and encouragement provided. Routine safety checks conducted every 15 minutes. Patient informed to notify staff with problems or concerns.   R: No adverse drug reactions noted. Patient contracts for safety at this time. Patient compliant with medications and treatment plan. Patient receptive, calm and cooperative. Patient interacts well with others on unit. Patient remains safe at this time.

## 2023-02-10 NOTE — Plan of Care (Signed)

## 2023-02-10 NOTE — Progress Notes (Signed)
Patient ID: Yolanda [name], female   DOB: 1976/04/12, 47 y.o.   MRN: [id number] [facility] MD Progress Note  02/10/2023 12:18 PM Yolanda [name]  MRN:  [id number]  Reason for admission: This is the first psychiatric admission in this [facility] in 12 years for this 89 AA female with an extensive hx of mental illnesses & probable polysubstance use disorders. She is admitted to the [facility] from the [facility] hospital with complain of worsening suicidal ideations with plan to stab herself. Per chart review, patient apparently reported at the ED that she has been depressed for a while & has not been taking her mental health medications. After medical evaluation.clearance, she was transferred to the [facility] for further psychiatric evaluation/treatments.   APS report on 12/31/22 Has payee, no legal guardian Continues to await placement.  24 hr chart review: V/S wnl. Pt re.mains compliant with medications. No behavioral episodes reported overnight. PRN medications in the past 24 hrs are Hydroxyzine and Tylenol. Reported racing thoughts overnight & reported feelings of guilt about something in the past, otherwise, night was uneventful.  Daily notes:  Pt with flat affect and depressed mood, attention to personal hygiene and grooming is poor, eye contact is minimal, speech is garbled and incomprehensible at times. Thought contents are organized and logical, and pt currently denies SI/HI/VH. She endorses +AH of voices "saying bad things", which she refuses to share with Clinical research associate.  She reports feelings of guilt about things that she did in the past, states "I shit the couch and now I am here." Reports feeling sad related to continuous hospitalization. Rates depression as 10, 10 being worst and rates her anxiety as 10, 10 also being worst. She presents with paranoia, states that people inside and also outside of this building are out to harm her. She states that she feels as though people know what she is thinking. Psychosis as  reported above is thought to be her baseline at this time. Medications will remain same as listed below. Reports sleep as fair, reports appetite as good.   Pt denies being in any physical pain, denies medication related side effects. Staff is continuing to reinforce the need to tend to hygiene in patient. Room lock out continue to be in place for staff to enforce if pt fails to come out of her room during meals and activities. We are continuing to monitor on a daily basis.  We are continuing to await DSS, to coordinate with our CSW's to place patient in a safe environment in the community.    Principal Problem: Schizoaffective disorder, depressive type (HCC)   Diagnosis: Principal Problem:   Schizoaffective disorder, depressive type (HCC) Active Problems:   GERD (gastroesophageal reflux disease)   Intellectual disability   Tobacco use disorder  Past Psychiatric History: Patient is not forth coming with the answers to the assessment questions. She is refusing to provide information about her mental health hx or symptoms.   Past Medical History:  Past Medical History:  Diagnosis Date   Bipolar affect, depressed (HCC)    Constipation 08/17/2022   Depression    Falls 07/21/2022   Fracture of femoral neck, right, closed (HCC) 01/17/2022   Herpes simplex 08/22/2017   Open fracture dislocation of right elbow joint 01/17/2022    Past Surgical History:  Procedure Laterality Date   NO PAST SURGERIES     SALPINGECTOMY     Family History: History reviewed. No pertinent family history.  Family Psychiatric  History: See H&P.  Social History:  Social History   Substance and Sexual Activity  Alcohol Use Yes     Social History   Substance and Sexual Activity  Drug Use Yes   Types: Cocaine, Marijuana    Social History   Socioeconomic History   Marital status: Single    Spouse name: Not on file   Number of children: Not on file   Years of education: Not on file   Highest  education level: Not on file  Occupational History   Not on file  Tobacco Use   Smoking status: Every Day   Smokeless tobacco: Not on file  Substance and Sexual Activity   Alcohol use: Yes   Drug use: Yes    Types: Cocaine, Marijuana   Sexual activity: Yes  Other Topics Concern   Not on file  Social History Narrative   Not on file   Social Determinants of Health   Financial Resource Strain: Low Risk  (09/12/2022)   Received from [facility], [facility]   Overall Financial Resource Strain (CARDIA)    Difficulty of Paying Living Expenses: Not hard at all  Food Insecurity: Patient Declined (12/20/2022)   Hunger Vital Sign    Worried About Running Out of Food in the Last Year: Patient declined    Ran Out of Food in the Last Year: Patient declined  Transportation Needs: No Transportation Needs (12/20/2022)   PRAPARE - Administrator, Civil Service (Medical): No    Lack of Transportation (Non-Medical): No  Physical Activity: Not on file  Stress: No Stress Concern Present (07/17/2022)   Received from [facility], [facility] - Occupational Stress Questionnaire    Feeling of Stress : Not at all  Social Connections: Unknown (07/16/2022)   Received from [facility], [facility]   Social Network    Social Network: Not on file   Current Medications: Current Facility-Administered Medications  Medication Dose Route Frequency Provider Last Rate Last Admin   acetaminophen (TYLENOL) tablet 650 mg  650 mg Oral Q6H PRN [name], NP   650 mg at 02/09/23 2034   alum & mag hydroxide-simeth (MAALOX/MYLANTA) 200-200-20 MG/5ML suspension 30 mL  30 mL Oral Q4H PRN [name], NP   30 mL at 01/28/23 1530   busPIRone (BUSPAR) tablet 15 mg  15 mg Oral BID [name], NP   15 mg at 02/10/23 0900   cyanocobalamin (VITAMIN B12) injection 1,000 mcg  1,000 mcg Intramuscular Q30 days [name], Nadir, MD   1,000 mcg at 02/02/23 1238    diphenhydrAMINE (BENADRYL) capsule 50 mg  50 mg Oral TID PRN [name], NP   50 mg at 01/15/23 1211   Or   diphenhydrAMINE (BENADRYL) injection 50 mg  50 mg Intramuscular TID PRN [name], NP       feeding supplement (ENSURE ENLIVE / ENSURE PLUS) liquid 237 mL  237 mL Oral TID BM Renelda Kilian, NP   237 mL at 02/10/23 1004   haloperidol (HALDOL) tablet 5 mg  5 mg Oral TID PRN [name], NP   5 mg at 01/15/23 1211   Or   haloperidol lactate (HALDOL) injection 5 mg  5 mg Intramuscular TID PRN [name], NP       hydrOXYzine (ATARAX) tablet 25 mg  25 mg Oral TID PRN [name], DO   25 mg at 02/09/23 2015   LORazepam (ATIVAN) tablet 2 mg  2 mg Oral TID PRN Mayford Knife,  Channing Mutters, NP   2 mg at 01/15/23 1211   Or   LORazepam (ATIVAN) injection 2 mg  2 mg Intramuscular TID PRN [name], NP       magnesium hydroxide (MILK OF MAGNESIA) suspension 30 mL  30 mL Oral Daily PRN [name], NP       melatonin tablet 5 mg  5 mg Oral QHS Breckan Cafiero, NP   5 mg at 02/09/23 2034   nicotine (NICODERM CQ - dosed in mg/24 hours) patch 21 mg  21 mg Transdermal Daily [name], DO   21 mg at 02/10/23 0900   paliperidone (INVEGA) 24 hr tablet 6 mg  6 mg Oral Daily [name], NP   6 mg at 02/09/23 2034   pantoprazole (PROTONIX) EC tablet 40 mg  40 mg Oral Daily [name], NP   40 mg at 02/10/23 0900   polyethylene glycol (MIRALAX / GLYCOLAX) packet 17 g  17 g Oral Daily PRN [name], NP       sertraline (ZOLOFT) tablet 150 mg  150 mg Oral Daily Massengill, Nathan, MD   150 mg at 02/10/23 0900   traZODone (DESYREL) tablet 50 mg  50 mg Oral QHS [name], NP   50 mg at 02/09/23 2034   Lab Results:  No results found for this or any previous visit (from the past 48 hour(s)).  Blood Alcohol level:  Lab Results  Component Value Date   ETH <10 12/19/2022   ETH <10 11/21/2019   Metabolic Disorder Labs: Lab Results  Component Value Date   HGBA1C 4.4 (L) 12/21/2022    MPG 79.58 12/21/2022   No results found for: "PROLACTIN" Lab Results  Component Value Date   CHOL 218 (H) 12/21/2022   TRIG 81 12/21/2022   HDL 53 12/21/2022   CHOLHDL 4.1 12/21/2022   VLDL 16 12/21/2022   LDLCALC 149 (H) 12/21/2022   LDLCALC 110 (H) 03/13/2011   Physical Findings: AIMS: Facial and Oral Movements Muscles of Facial Expression: None, normal Lips and Perioral Area: None, normal Jaw: None, normal Tongue: None, normal,Extremity Movements Upper (arms, wrists, hands, fingers): None, normal Lower (legs, knees, ankles, toes): None, normal, Trunk Movements Neck, shoulders, hips: None, normal, Overall Severity Severity of abnormal movements (highest score from questions above): None, normal Incapacitation due to abnormal movements: None, normal Patient's awareness of abnormal movements (rate only patient's report): No Awareness, Dental Status Current problems with teeth and/or dentures?: No Does patient usually wear dentures?: No   Musculoskeletal: Strength & Muscle Tone: within normal limits Gait & Station: normal Patient leans: N/A  Psychiatric Specialty Exam:  Presentation  General Appearance:  Lying down in bed, better hygiene noted  Eye Contact: Fair Speech: Other (comment) (garbled)  Speech Volume: Decreased  Handedness: Right  Mood and Affect  Mood: Depressed; Anxious  Affect: Congruent  Thought Process  Thought Processes: Coherent  Descriptions of Associations:Intact  Orientation:Partial  Thought Content: Denies SI HI or AVH does not present responding to stimuli. Presentation consistent with moderate intellectual disability  History of Schizophrenia/Schizoaffective disorder:Yes  Duration of Psychotic Symptoms:Greater than six months  Hallucinations:Hallucinations: Auditory  Ideas of Reference:Paranoia  Suicidal Thoughts:Suicidal Thoughts: No  Homicidal Thoughts:Homicidal Thoughts: No  Sensorium  Memory: Remote  Poor  Judgment: Limited Insight: Limited Executive Functions  Concentration: Poor  Attention Span: Fair  Recall: Poor  Fund of Knowledge: Poor  Language: Poor  Psychomotor Activity  Psychomotor Activity: Psychomotor Activity: Normal  Assets  Assets: Resilience  Sleep  Sleep: Sleep: Good  Physical Exam: Physical Exam Vitals and nursing note reviewed.  Constitutional:      General: She is not in acute distress.    Appearance: She is obese. She is not ill-appearing or diaphoretic.  HENT:     Head: Normocephalic.     Comments: Intellectual disability    Nose: No congestion.     Mouth/Throat:     Mouth: Mucous membranes are moist.     Pharynx: Oropharynx is clear.  Eyes:     Pupils: Pupils are equal, round, and reactive to light.  Cardiovascular:     Rate and Rhythm: Normal rate.     Pulses: Normal pulses.  Pulmonary:     Effort: Pulmonary effort is normal. No respiratory distress.  Abdominal:     Comments: Deferred  Genitourinary:    Comments: Deferred Musculoskeletal:        General: Normal range of motion.     Cervical back: Normal range of motion.  Skin:    General: Skin is warm.  Neurological:     General: No focal deficit present.     Mental Status: She is alert.  Psychiatric:        Behavior: Behavior normal.    Blood pressure 107/69, pulse 76, temperature 97.6 F (36.4 C), resp. rate 19, height 4\' 11"  (1.499 m), weight 63 kg, SpO2 97%. Body mass index is 28.05 kg/m.  Treatment Plan Summary: Daily contact with patient to assess and evaluate symptoms and progress in treatment and Medication management.   Still waiting on safe disposition as patient would not be able to live independently given cognitive impairment. Recommend assisted living.   No medication side effects.  Because of urinary incontinence, recommended adult diapers to be worn.  Principal/active diagnoses:  Principal Problem:   Schizoaffective disorder, depressive type  (HCC) Active Problems:   GERD (gastroesophageal reflux disease)   Intellectual disability   Tobacco use disorder (MOCA 16/30) moderate cognitive impairment probably related to moderate intellectual disability.  Plan:  -Continue Vitamin D 50.000 units weekly for bone health. -Continue Sertraline150 mg po Q daily for depression/anxiety -Continue Buspar 15 mg po bid for anxiety.  -Continue paliperidone 6 mg po qd for mood stabilization -Continue Trazodone 50 mg nightly for insomnia  -Continue Nicoderm 21 mg topically Q 24 hrs for nicotine withdrawal management. -Continue vitamin B12 1000 mcg IM weekly for 4 weeks then monthly -Continue Melatonin 5 mg nightly for sleep -Continue Protonix EC 40 mg p.o. daily for GERD -Continue MiraLAX 17 g p.o. PRN for constipation -Continue hydroxyzine 25 mg p.o. 3 times daily as needed for anxiety -Continue Ensure nutritional shakes TID in between meals  -Previously discontinued Hydroxyzine 50 mg nightly-hypotension in the mornings  Safety and Monitoring: Voluntary admission to inpatient psychiatric unit for safety, stabilization and treatment Daily contact with patient to assess and evaluate symptoms and progress in treatment Patient's case to be discussed in multi-disciplinary team meeting Observation Level : q15 minute checks Vital signs: q12 hours Precautions: Safety   Discharge Planning: Social work and case management to assist with discharge planning and identification of hospital follow-up needs prior to discharge Estimated LOS: Unknown at this time. Discharge Concerns: Need to establish a safety plan; Medication compliance and effectiveness Discharge Goals: Return home with outpatient referrals for mental health follow-up including medication management/psychotherapy No legal guardian, has payee  [name], NP.Marland Kitchen [id number]. 02/10/2023, 12:18 PM Patient ID: Larey Brick, female   DOB: 10/05/1975, 47 y.o.

## 2023-02-10 NOTE — [facility] Group Notes (Signed)
[facility] Group Notes:  (Nursing)  Date:  02/10/2023  Time:  1400  Type of Therapy:  Psychoeducational Skills  Participation Level:  Active  Participation Quality:  Appropriate, Attentive, and Sharing  Affect:  Appropriate  Cognitive:  Appropriate  Insight:  Improving  Engagement in Group:  Engaged and Improving  Modes of Intervention:  Discussion, Exploration, Rapport Building, Socialization, and Support  Summary of Progress/Problems:   Group played a noncompetitive game that encourages people to share thoughts, feelings, and ideas as they explore a variety of topics Shela Nevin 02/10/2023, 4:01 PM

## 2023-02-11 ENCOUNTER — Encounter (HOSPITAL_COMMUNITY): Payer: Self-pay

## 2023-02-11 DIAGNOSIS — F251 Schizoaffective disorder, depressive type: Secondary | ICD-10-CM | POA: Diagnosis not present

## 2023-02-11 NOTE — Progress Notes (Signed)
Patient ID: Yolanda [name], female   DOB: [birth year], 47 y.o.   MRN: [id number] [facility] MD Progress Note  02/11/2023 3:01 PM Kaleya CHARNELL BRIGHAM  MRN:  [id number]  Reason for admission: This is the first psychiatric admission in this [facility] in 12 years for this 30 AA female with an extensive hx of mental illnesses & probable polysubstance use disorders. She is admitted to the [facility] from the [facility] hospital with complain of worsening suicidal ideations with plan to stab herself. Per chart review, patient apparently reported at the ED that she has been depressed for a while & has not been taking her mental health medications. After medical evaluation.clearance, she was transferred to the [facility] for further psychiatric evaluation/treatments.   APS report on 12/31/22 Has payee, no legal guardian Continues to await placement.  24 hr chart review: V/S with BP low earlier today morning, but later checked, still low, but WNL.Pt remains compliant with medications, isolative to the room, no behavioral issues reported over.night.   Daily notes:  Pt with flat affect and depressed mood, attention to personal hygiene and grooming is fair, eye contact is good, speech is clear & coherent. Thought contents are organized and logical, and pt currently denies SI/HI/AVH or paranoia.   Pt refused to engage with writer initially during encounter, became angry when being told to get out, of the bed during unit activities, and participate in unit group sessions. This has been reiterated to the staff who have been told to ensure that pt is out of the room during the day time. Pt reports a good sleep quality last night, reports a good appetite, denies medication related side effects. We are continued medications as below with no changes at this time.  We are continuing to await DSS, to coordinate with our CSW's to place patient in a safe environment in the community.    Principal Problem: Schizoaffective disorder, depressive type  (HCC)   Diagnosis: Principal Problem:   Schizoaffective disorder, depressive type (HCC) Active Problems:   GERD (gastroesophageal reflux disease)   Intellectual disability   Tobacco use disorder  Past Psychiatric History: Patient is not forth coming with the answers to the assessment questions. She is refusing to provide information about her mental health hx or symptoms.   Past Medical History:  Past Medical History:  Diagnosis Date   Bipolar affect, depressed (HCC)    Constipation 08/17/2022   Depression    Falls 07/21/2022   Fracture of femoral neck, right, closed (HCC) 01/17/2022   Herpes simplex 08/22/2017   Open fracture dislocation of right elbow joint 01/17/2022    Past Surgical History:  Procedure Laterality Date   NO PAST SURGERIES     SALPINGECTOMY     Family History: History reviewed. No pertinent family history.  Family Psychiatric  History: See H&P.  Social History:  Social History   Substance and Sexual Activity  Alcohol Use Yes     Social History   Substance and Sexual Activity  Drug Use Yes   Types: Cocaine, Marijuana    Social History   Socioeconomic History   Marital status: Single    Spouse name: Not on file   Number of children: Not on file   Years of education: Not on file   Highest education level: Not on file  Occupational History   Not on file  Tobacco Use   Smoking status: Every Day   Smokeless tobacco: Not on file  Substance and Sexual Activity   Alcohol use:  Yes   Drug use: Yes    Types: Cocaine, Marijuana   Sexual activity: Yes  Other Topics Concern   Not on file  Social History Narrative   Not on file   Social Determinants of Health   Financial Resource Strain: Low Risk  (09/12/2022)   Received from [facility], [facility]   Overall Financial Resource Strain (CARDIA)    Difficulty of Paying Living Expenses: Not hard at all  Food Insecurity: Patient Declined (12/20/2022)   Hunger Vital Sign    Worried About  Running Out of Food in the Last Year: Patient declined    Ran Out of Food in the Last Year: Patient declined  Transportation Needs: No Transportation Needs (12/20/2022)   PRAPARE - Administrator, Civil Service (Medical): No    Lack of Transportation (Non-Medical): No  Physical Activity: Not on file  Stress: No Stress Concern Present (07/17/2022)   Received from [facility], [facility] of Occupational Health - Occupational Stress Questionnaire    Feeling of Stress : Not at all  Social Connections: Unknown (07/16/2022)   Received from [facility], [facility]   Social Network    Social Network: Not on file   Current Medications: Current Facility-Administered Medications  Medication Dose Route Frequency Provider Last Rate Last Admin   acetaminophen (TYLENOL) tablet 650 mg  650 mg Oral Q6H PRN [name], NP   650 mg at 02/09/23 2034   alum & mag hydroxide-simeth (MAALOX/MYLANTA) 200-200-20 MG/5ML suspension 30 mL  30 mL Oral Q4H PRN [name], NP   30 mL at 01/28/23 1530   busPIRone (BUSPAR) tablet 15 mg  15 mg Oral BID [name], NP   15 mg at 02/11/23 8413   cyanocobalamin (VITAMIN B12) injection 1,000 mcg  1,000 mcg Intramuscular Q30 days Sarita Bottom, MD   1,000 mcg at 02/02/23 1238   diphenhydrAMINE (BENADRYL) capsule 50 mg  50 mg Oral TID PRN [name], NP   50 mg at 01/15/23 1211   Or   diphenhydrAMINE (BENADRYL) injection 50 mg  50 mg Intramuscular TID PRN [name], NP       feeding supplement (ENSURE ENLIVE / ENSURE PLUS) liquid 237 mL  237 mL Oral TID BM Maliik Karner, NP   237 mL at 02/11/23 0953   haloperidol (HALDOL) tablet 5 mg  5 mg Oral TID PRN [name], NP   5 mg at 01/15/23 1211   Or   haloperidol lactate (HALDOL) injection 5 mg  5 mg Intramuscular TID PRN [name], NP       hydrOXYzine (ATARAX) tablet 25 mg  25 mg Oral TID PRN [name], DO   25 mg at 02/10/23 2106   LORazepam (ATIVAN) tablet 2  mg  2 mg Oral TID PRN [name], NP   2 mg at 01/15/23 1211   Or   LORazepam (ATIVAN) injection 2 mg  2 mg Intramuscular TID PRN [name], NP       magnesium hydroxide (MILK OF MAGNESIA) suspension 30 mL  30 mL Oral Daily PRN [name], NP       melatonin tablet 5 mg  5 mg Oral QHS Dedee Liss, NP   5 mg at 02/10/23 2106   nicotine (NICODERM CQ - dosed in mg/24 hours) patch 21 mg  21 mg Transdermal Daily [name], DO   21 mg at 02/10/23 0900   paliperidone (INVEGA) 24 hr tablet 6  mg  6 mg Oral Daily [name], NP   6 mg at 02/10/23 2106   pantoprazole (PROTONIX) EC tablet 40 mg  40 mg Oral Daily [name], NP   40 mg at 02/11/23 1610   polyethylene glycol (MIRALAX / GLYCOLAX) packet 17 g  17 g Oral Daily PRN [name], NP   17 g at 02/10/23 1707   sertraline (ZOLOFT) tablet 150 mg  150 mg Oral Daily Massengill, Harrold Donath, MD   150 mg at 02/11/23 9604   traZODone (DESYREL) tablet 50 mg  50 mg Oral QHS [name], NP   50 mg at 02/10/23 2106   Lab Results:  No results found for this or any previous visit (from the past 48 hour(s)).  Blood Alcohol level:  Lab Results  Component Value Date   ETH <10 12/19/2022   ETH <10 11/21/2019   Metabolic Disorder Labs: Lab Results  Component Value Date   HGBA1C 4.4 (L) 12/21/2022   MPG 79.58 12/21/2022   No results found for: "PROLACTIN" Lab Results  Component Value Date   CHOL 218 (H) 12/21/2022   TRIG 81 12/21/2022   HDL 53 12/21/2022   CHOLHDL 4.1 12/21/2022   VLDL 16 12/21/2022   LDLCALC 149 (H) 12/21/2022   LDLCALC 110 (H) 03/13/2011   Physical Findings: AIMS: Facial and Oral Movements Muscles of Facial Expression: None, normal Lips and Perioral Area: None, normal Jaw: None, normal Tongue: None, normal,Extremity Movements Upper (arms, wrists, hands, fingers): None, normal Lower (legs, knees, ankles, toes): None, normal, Trunk Movements Neck, shoulders, hips: None, normal, Overall  Severity Severity of abnormal movements (highest score from questions above): None, normal Incapacitation due to abnormal movements: None, normal Patient's awareness of abnormal movements (rate only patient's report): No Awareness, Dental Status Current problems with teeth and/or dentures?: No Does patient usually wear dentures?: No   Musculoskeletal: Strength & Muscle Tone: within normal limits Gait & Station: normal Patient leans: N/A  Psychiatric Specialty Exam:  Presentation  General Appearance:  Lying down in bed, better hygiene noted  Eye Contact: Fair Speech: Clear and Coherent  Speech Volume: Decreased  Handedness: Right  Mood and Affect  Mood: Depressed; Dysphoric  Affect: Congruent  Thought Process  Thought Processes: Coherent  Descriptions of Associations:Intact  Orientation:Partial  Thought Content: Denies SI HI or AVH does not present responding to stimuli. Presentation consistent with moderate intellectual disability  History of Schizophrenia/Schizoaffective disorder:Yes  Duration of Psychotic Symptoms:N/A  Hallucinations:Hallucinations: None  Ideas of Reference:None  Suicidal Thoughts:Suicidal Thoughts: No  Homicidal Thoughts:Homicidal Thoughts: No  Sensorium  Memory: Immediate Good  Judgment: Limited Insight: Limited Executive Functions  Concentration: Fair  Attention Span: Poor  Recall: Poor  Fund of Knowledge: Poor  Language: Poor  Psychomotor Activity  Psychomotor Activity: Psychomotor Activity: Decreased  Assets  Assets: Resilience  Sleep  Sleep: Sleep: Good  Physical Exam: Physical Exam Vitals and nursing note reviewed.  Constitutional:      General: She is not in acute distress.    Appearance: She is obese. She is not ill-appearing or diaphoretic.  HENT:     Head: Normocephalic.     Comments: Intellectual disability    Nose: No congestion.     Mouth/Throat:     Mouth: Mucous membranes are  moist.     Pharynx: Oropharynx is clear.  Eyes:     Pupils: Pupils are equal, round, and reactive to light.  Cardiovascular:     Rate and Rhythm: Normal rate.  Pulses: Normal pulses.  Pulmonary:     Effort: Pulmonary effort is normal. No respiratory distress.  Abdominal:     Comments: Deferred  Genitourinary:    Comments: Deferred Musculoskeletal:        General: Normal range of motion.     Cervical back: Normal range of motion.  Skin:    General: Skin is warm.  Neurological:     General: No focal deficit present.     Mental Status: She is alert.  Psychiatric:        Behavior: Behavior normal.    Blood pressure 90/69, pulse 62, temperature 97.9 F (36.6 C), temperature source Oral, resp. rate 19, height 4\' 11"  (1.499 m), weight 63 kg, SpO2 96%. Body mass index is 28.05 kg/m.  Treatment Plan Summary: Daily contact with patient to assess and evaluate symptoms and progress in treatment and Medication management.   Still waiting on safe disposition as patient would not be able to live independently given cognitive impairment. Recommend assisted living.   No medication side effects.  Because of urinary incontinence, recommended adult diapers to be worn.  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  Plan:  -Continue Vitamin D 50.000 units weekly for bone health. -Continue Sertraline150 mg po Q daily for depression/anxiety -Continue Buspar 15 mg po bid for anxiety.  -Continue paliperidone 6 mg po qd for mood stabilization -Continue Trazodone 50 mg nightly for insomnia  -Continue Nicoderm 21 mg topically Q 24 hrs for nicotine withdrawal management. -Continue vitamin B12 1000 mcg IM weekly for 4 weeks then  monthly -Continue Melatonin 5 mg nightly for sleep -Continue Protonix EC 40 mg p.o. daily for GERD -Continue MiraLAX 17 g p.o. PRN for constipation -Continue hydroxyzine 25 mg p.o. 3 times daily as needed for anxiety -Continue Ensure nutritional shakes TID in between meals  -Previously discontinued Hydroxyzine 50 mg nightly-hypotension in the mornings  Safety and Monitoring: Voluntary admission to inpatient psychiatric unit for safety, stabilization and treatment Daily contact with patient to assess and evaluate symptoms and progress in treatment Patient's case to be discussed in multi-disciplinary team meeting Observation Level : q15 minute checks Vital signs: q12 hours Precautions: Safety   Discharge Planning: Social work and case management to assist with discharge planning and identification of hospital follow-up needs prior to discharge Estimated LOS: Unknown at this time. Discharge Concerns: Need to establish a safety plan; Medication compliance and effectiveness Discharge Goals: Return home with outpatient referrals for mental health follow-up including medication management/psychotherapy No legal guardian, has payee  [name], NP.Marland Kitchen Pmhnp-bc. 02/11/2023, 3:01 PM Patient ID: Larey Brick, female   DOB: 1976-02-02, 47 y.o.     Patient ID: BRYLI CEASAR, female   DOB: [birth year], 47 y.o.   MRN: [id number]

## 2023-02-11 NOTE — Progress Notes (Signed)
Patient refusing to get out of bed for room lockout/group.

## 2023-02-11 NOTE — Progress Notes (Signed)
   02/10/23 2106  Psych Admission Type (Psych Patients Only)  Admission Status Involuntary  Psychosocial Assessment  Patient Complaints None  Eye Contact Fair  Facial Expression Anxious  Affect Flat  Speech Slow  Interaction Childlike  Motor Activity Slow  Appearance/Hygiene Disheveled  Behavior Characteristics Cooperative  Mood Depressed;Sad  Thought Process  Coherency WDL  Content WDL  Delusions None reported or observed  Perception WDL  Hallucination None reported or observed  Judgment Poor  Confusion WDL  Danger to Self  Current suicidal ideation? Denies  Self-Injurious Behavior No self-injurious ideation or behavior indicators observed or expressed   Agreement Not to Harm Self Yes  Description of Agreement verbal  Danger to Others  Danger to Others None reported or observed

## 2023-02-11 NOTE — Progress Notes (Signed)
   02/11/23 1100  Psych Admission Type (Psych Patients Only)  Admission Status Involuntary  Psychosocial Assessment  Patient Complaints Sadness  Eye Contact Brief  Facial Expression Anxious  Affect Flat  Speech Slow  Interaction Childlike  Motor Activity Slow  Appearance/Hygiene Disheveled  Behavior Characteristics Unwilling to participate  Mood Depressed;Sad  Thought Process  Coherency WDL  Content WDL  Delusions None reported or observed  Perception WDL  Hallucination None reported or observed  Judgment Poor  Confusion None  Danger to Self  Current suicidal ideation? Denies  Self-Injurious Behavior No self-injurious ideation or behavior indicators observed or expressed   Agreement Not to Harm Self Yes  Description of Agreement verbal  Danger to Others  Danger to Others None reported or observed

## 2023-02-11 NOTE — Group Note (Signed)
Recreation Therapy Group Note   Group Topic:Relaxation  Group Date: 02/11/2023 Start Time: 0945 End Time: 1005 Facilitators: Imelda Dandridge-McCall, LRT,CTRS Location: [address] Dayroom   Goal Area(s) Addresses:  Patient will identify positive stress management techniques. Patient will identify benefits of using stress management post d/c.  Group Description: Meditation. LRT discussed meditation with group. LRT then played a meditation that focused identifying those things that are holding you back and how to overcome them. Patients were to get as comfortable as possible and focus on the content of the meditation. LRT informed patients they could find other meditations and stress management techniques from apps, Youtube, scripts, etc.   Affect/Mood: N/A   Participation Level: Did not attend    Clinical Observations/Individualized Feedback:     Plan: Continue to engage patient in RT group sessions 2-3x/week.   Grayden Burley-McCall, LRT,CTRS  02/11/2023 12:11 PM

## 2023-02-11 NOTE — Progress Notes (Signed)
Pt did attend AA group  

## 2023-02-11 NOTE — Plan of Care (Signed)
  Problem: Education: Goal: Knowledge of the prescribed therapeutic regimen will improve Outcome: Progressing   Problem: Activity: Goal: Interest or engagement in leisure activities will improve Outcome: Progressing

## 2023-02-11 NOTE — Progress Notes (Signed)
Chaplain engaged in an initial visit with Yolanda [name] after receiving referral from [employer]. Yolanda [name] became tearful as she expressed that she has been hospitalized for two months now. She expressed some grief around not seeing her family. Yolanda [name] mood instantly changed when thinking about her grandson, Shirley Muscat, that she desperately wants to see. She also spent time talking about her two daughters and how proud she is of them. Chaplain recognized that Yolanda [name] finds a lot of joy in her community and wants to be closer to them. Yolanda [name] also talked about her "step-father," who is a past boyfriend of her moms who is currently still active in her life and in her children's lives.   Yolanda [name] expressed some pain around the relationship she shares with her mom. She voiced that her mom loves her brother more than her. Yolanda [name] wasn't able to explore that relationship fully but recognizes that she desires for that relationship to be more than it is right now. Chaplain will follow-up tomorrow to continue conversations.   Chaplain assessed that Yolanda [name] is missing "home" and seeing her family. She did recognize that she can create a new "home" but that it won't be the same. Chaplain worked to affirm Yolanda [name]' feelings of wanting to be discharged, while honoring the process she has endured. She desires to be in a group home that allows her time to spend with her daughters and grandchild.   Chaplain offered reflective listening, compassionate presence, and support.    02/11/23 1700  Spiritual Encounters  Type of Visit Initial  Care provided to: Patient  Reason for visit Routine spiritual support

## 2023-02-11 NOTE — Progress Notes (Signed)
   02/11/23 0538  15 Minute Checks  Location Bedroom  Visual Appearance Calm  Behavior Sleeping  Sleep (Behavioral Health Patients Only)  Calculate sleep? (Click Yes once per 24 hr at 0600 safety check) Yes  Documented sleep last 24 hours 7.75

## 2023-02-11 NOTE — BHH Group Notes (Signed)
Adult Psychoeducational Group Note  Date:  02/11/2023 Time:  11:12 AM  Group Topic/Focus:  Goals Group:   The focus of this group is to help patients establish daily goals to achieve during treatment and discuss how the patient can incorporate goal setting into their daily lives to aide in recovery. Managing Feelings:   The focus of this group is to identify what feelings patients have difficulty handling and develop a plan to handle them in a healthier way upon discharge.  Participation Level:  Did Not Attend  Participation Quality:    Affect:    Cognitive:    Insight:   Engagement in Group:    Modes of Intervention:    Additional Comments:  Pt did not attend group  [employer] 02/11/2023, 11:12 AM

## 2023-02-12 DIAGNOSIS — F251 Schizoaffective disorder, depressive type: Secondary | ICD-10-CM | POA: Diagnosis not present

## 2023-02-12 NOTE — Group Note (Signed)
Recreation Therapy Group Note   Group Topic:Animal Assisted Therapy   Group Date: 02/12/2023 Start Time: 0950 End Time: 1030 Facilitators: Anias Bartol-McCall, LRT,CTRS Location: [address] Dayroom   Animal-Assisted Activity (AAA) Program Checklist/Progress Notes Patient Eligibility Criteria Checklist & Daily Group note for Rec Tx Intervention  AAA/T Program Assumption of Risk Form signed by Patient/ or Parent Legal Guardian Yes  Patient is free of allergies or severe asthma Yes  Patient reports no fear of animals Yes  Patient reports no history of cruelty to animals Yes  Patient understands his/her participation is voluntary Yes  Patient washes hands before animal contact Yes  Patient washes hands after animal contact Yes   Affect/Mood: Appropriate   Participation Level: Engaged   Participation Quality: Independent   Behavior: Appropriate   Speech/Thought Process: Focused   Insight: Good   Judgement: Good   Modes of Intervention: Teaching laboratory technician   Patient Response to Interventions:  Engaged   Education Outcome:  Acknowledges education   Clinical Observations/Individualized Feedback: Pt attended and engaged with therapy dog. Pt was quiet for remainder of group.     Plan: Continue to engage patient in RT group sessions 2-3x/week.   Lashun Ramseyer-McCall, LRT,CTRS 02/12/2023 12:49 PM

## 2023-02-12 NOTE — Progress Notes (Signed)
Patient ID: Yolanda [name], female   DOB: [birth year], 47 y.o.   MRN: [id number] [facility] MD Progress Note  02/12/2023 2:22 PM Yolanda [name]  MRN:  [id number]  Reason for admission: This is the first psychiatric admission in this [facility] in 12 years for this 56 AA female with an extensive hx of mental illnesses & probable polysubstance use disorders. She is admitted to the [facility] from the [facility] hospital with complain of worsening suicidal ideations with plan to stab herself. Per chart review, patient apparently reported at the ED that she has been depressed for a while & has not been taking her mental health medications. After medical evaluation.clearance, she was transferred to the [facility] for further psychiatric evaluation/treatments.   APS report on 12/31/22 Has payee, no legal guardian Continues to await placement.  24 hr chart review: V/S WNL. Pt remains compliant with medications, isolative to the room, and continuing to require positive verbal reinforcements to get out of her room. no behavioral issues reported overnight.   Daily notes:  Pt continues to present with flat affect and depressed mood, and is tearful during encounter. Her attention to personal hygiene and grooming is poor, she requires reinforcements to tend to personal hygiene needs. Her eye contact is good, speech is clear & coherent. Pt currently denies SI/HI/AVH or paranoia.   Pt has continued to verbalize her frutration with continuing to remain hospitalized. We have educated her that we are awaiting DSS to coordinate with CSW to place her in a safe living arrangement outside of the hospital environment, but she is seeming not to understanding this. She verbalizes feelings of abandonment by her family. She reports a fair sleep quality last night, staff reports that her sleep quality was good. She reports a good appetite, denies being in any physical pain, states that she is moving her bowels well.  We're continuing medications as  listed below. No TD/EPS type symptoms found on assessment, and pt denies any feelings of stiffness. AIMS: 0.   Principal Problem: Schizoaffective disorder, depressive type (HCC)   Diagnosis: Principal Problem:   Schizoaffective disorder, depressive type (HCC) Active Problems:   GERD (gastroesophageal reflux disease)   Intellectual disability   Tobacco use disorder  Past Psychiatric History: Patient is not forth coming with the answers to the assessment questions. She is refusing to provide information about her mental health hx or symptoms.   Past Medical History:  Past Medical History:  Diagnosis Date   Bipolar affect, depressed (HCC)    Constipation 08/17/2022   Depression    Falls 07/21/2022   Fracture of femoral neck, right, closed (HCC) 01/17/2022   Herpes simplex 08/22/2017   Open fracture dislocation of right elbow joint 01/17/2022    Past Surgical History:  Procedure Laterality Date   NO PAST SURGERIES     SALPINGECTOMY     Family History: History reviewed. No pertinent family history.  Family Psychiatric  History: See H&P.  Social History:  Social History   Substance and Sexual Activity  Alcohol Use Yes     Social History   Substance and Sexual Activity  Drug Use Yes   Types: Cocaine, Marijuana    Social History   Socioeconomic History   Marital status: Single    Spouse name: Not on file   Number of children: Not on file   Years of education: Not on file   Highest education level: Not on file  Occupational History   Not on file  Tobacco Use  Smoking status: Every Day   Smokeless tobacco: Not on file  Substance and Sexual Activity   Alcohol use: Yes   Drug use: Yes    Types: Cocaine, Marijuana   Sexual activity: Yes  Other Topics Concern   Not on file  Social History Narrative   Not on file   Social Determinants of Health   Financial Resource Strain: Low Risk  (09/12/2022)   Received [facility], [facility]   Overall Financial  Resource Strain (CARDIA)    Difficulty of Paying Living Expenses: Not hard at all  Food Insecurity: Patient Declined (12/20/2022)   Hunger Vital Sign    Worried About Running Out of Food in the Last Year: Patient declined    Ran Out of Food in the Last Year: Patient declined  Transportation Needs: No Transportation Needs (12/20/2022)   PRAPARE - Administrator, Civil Service (Medical): No    Lack of Transportation (Non-Medical): No  Physical Activity: Not on file  Stress: No Stress Concern Present (07/17/2022)   Received from [facility], [facility] - Occupational Stress Questionnaire    Feeling of Stress : Not at all  Social Connections: Unknown (07/16/2022)   Received from [facility], [facility]   Social Network    Social Network: Not on file   Current Medications: Current Facility-Administered Medications  Medication Dose Route Frequency Provider Last Rate Last Admin   acetaminophen (TYLENOL) tablet 650 mg  650 mg Oral Q6H PRN [name], NP   650 mg at 02/11/23 1727   alum & mag hydroxide-simeth (MAALOX/MYLANTA) 200-200-20 MG/5ML suspension 30 mL  30 mL Oral Q4H PRN [name], NP   30 mL at 01/28/23 1530   busPIRone (BUSPAR) tablet 15 mg  15 mg Oral BID [name], NP   15 mg at 02/12/23 0834   cyanocobalamin (VITAMIN B12) injection 1,000 mcg  1,000 mcg Intramuscular Q30 days Sarita Bottom, MD   1,000 mcg at 02/02/23 1238   diphenhydrAMINE (BENADRYL) capsule 50 mg  50 mg Oral TID PRN [name], NP   50 mg at 01/15/23 1211   Or   diphenhydrAMINE (BENADRYL) injection 50 mg  50 mg Intramuscular TID PRN [name], NP       feeding supplement (ENSURE ENLIVE / ENSURE PLUS) liquid 237 mL  237 mL Oral TID BM Taylorann Tkach, NP   237 mL at 02/12/23 1017   haloperidol (HALDOL) tablet 5 mg  5 mg Oral TID PRN [name], NP   5 mg at 01/15/23 1211   Or   haloperidol lactate (HALDOL) injection 5 mg  5 mg  Intramuscular TID PRN [name], NP       hydrOXYzine (ATARAX) tablet 25 mg  25 mg Oral TID PRN [name], DO   25 mg at 02/11/23 1727   LORazepam (ATIVAN) tablet 2 mg  2 mg Oral TID PRN [name], NP   2 mg at 01/15/23 1211   Or   LORazepam (ATIVAN) injection 2 mg  2 mg Intramuscular TID PRN [name], NP       magnesium hydroxide (MILK OF MAGNESIA) suspension 30 mL  30 mL Oral Daily PRN [name], NP       melatonin tablet 5 mg  5 mg Oral QHS Jayvion Stefanski, NP   5 mg at 02/11/23 2127   nicotine (NICODERM CQ - dosed in mg/24 hours) patch 21 mg  21 mg  Transdermal Daily [name], DO   21 mg at 02/10/23 0900   paliperidone (INVEGA) 24 hr tablet 6 mg  6 mg Oral Daily [name], NP   6 mg at 02/11/23 2126   pantoprazole (PROTONIX) EC tablet 40 mg  40 mg Oral Daily [name], NP   40 mg at 02/12/23 0834   polyethylene glycol (MIRALAX / GLYCOLAX) packet 17 g  17 g Oral Daily PRN [name], NP   17 g at 02/10/23 1707   sertraline (ZOLOFT) tablet 150 mg  150 mg Oral Daily Massengill, Harrold Donath, MD   150 mg at 02/12/23 0834   traZODone (DESYREL) tablet 50 mg  50 mg Oral QHS [name], NP   50 mg at 02/11/23 2126   Lab Results:  No results found for this or any previous visit (from the past 48 hour(s)).  Blood Alcohol level:  Lab Results  Component Value Date   ETH <10 12/19/2022   ETH <10 11/21/2019   Metabolic Disorder Labs: Lab Results  Component Value Date   HGBA1C 4.4 (L) 12/21/2022   MPG 79.58 12/21/2022   No results found for: "PROLACTIN" Lab Results  Component Value Date   CHOL 218 (H) 12/21/2022   TRIG 81 12/21/2022   HDL 53 12/21/2022   CHOLHDL 4.1 12/21/2022   VLDL 16 12/21/2022   LDLCALC 149 (H) 12/21/2022   LDLCALC 110 (H) 03/13/2011   Physical Findings: AIMS: Facial and Oral Movements Muscles of Facial Expression: None, normal Lips and Perioral Area: None, normal Jaw: None, normal Tongue: None, normal,Extremity  Movements Upper (arms, wrists, hands, fingers): None, normal Lower (legs, knees, ankles, toes): None, normal, Trunk Movements Neck, shoulders, hips: None, normal, Overall Severity Severity of abnormal movements (highest score from questions above): None, normal Incapacitation due to abnormal movements: None, normal Patient's awareness of abnormal movements (rate only patient's report): No Awareness, Dental Status Current problems with teeth and/or dentures?: No Does patient usually wear dentures?: No   Musculoskeletal: Strength & Muscle Tone: within normal limits Gait & Station: normal Patient leans: N/A  Psychiatric Specialty Exam:  Presentation  General Appearance:  Lying down in bed, better hygiene noted  Eye Contact: Fair Speech: Other (comment) (garbled)  Speech Volume: Decreased  Handedness: Right  Mood and Affect  Mood: Depressed  Affect: Congruent  Thought Process  Thought Processes: Coherent  Descriptions of Associations:Intact  Orientation:Partial  Thought Content: Denies SI HI or AVH does not present responding to stimuli. Presentation consistent with moderate intellectual disability  History of Schizophrenia/Schizoaffective disorder:Yes  Duration of Psychotic Symptoms:Greater than six months  Hallucinations:Hallucinations: None  Ideas of Reference:None  Suicidal Thoughts:Suicidal Thoughts: No  Homicidal Thoughts:Homicidal Thoughts: No  Sensorium  Memory: Immediate Good  Judgment: Limited Insight: Limited Executive Functions  Concentration: Fair  Attention Span: Poor  Recall: Poor  Fund of Knowledge: Poor  Language: Fair  Psychomotor Activity  Psychomotor Activity: Psychomotor Activity: Normal  Assets  Assets: Communication Skills  Sleep  Sleep: Sleep: Good  Physical Exam: Physical Exam Vitals and nursing note reviewed.  Constitutional:      General: She is not in acute distress.    Appearance: She is  obese. She is not ill-appearing or diaphoretic.  HENT:     Head: Normocephalic.     Comments: Intellectual disability    Nose: No congestion.     Mouth/Throat:     Mouth: Mucous membranes are moist.     Pharynx: Oropharynx is clear.  Eyes:  Pupils: Pupils are equal, round, and reactive to light.  Cardiovascular:     Rate and Rhythm: Normal rate.     Pulses: Normal pulses.  Pulmonary:     Effort: Pulmonary effort is normal. No respiratory distress.  Abdominal:     Comments: Deferred  Genitourinary:    Comments: Deferred Musculoskeletal:        General: Normal range of motion.     Cervical back: Normal range of motion.  Skin:    General: Skin is warm.  Neurological:     General: No focal deficit present.     Mental Status: She is alert.  Psychiatric:        Behavior: Behavior normal.    Blood pressure (!) 96/55, pulse 70, temperature 98.2 F (36.8 C), temperature source Oral, resp. rate 19, height 4\' 11"  (1.499 m), weight 63 kg, SpO2 97%. Body mass index is 28.05 kg/m.  Treatment Plan Summary: Daily contact with patient to assess and evaluate symptoms and progress in treatment and Medication management.   Still waiting on safe disposition as patient would not be able to live independently given cognitive impairment. Recommend assisted living.   No medication side effects.  Because of urinary incontinence, recommended adult diapers to be worn.  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  Plan:  -Continue Vitamin D 50.000 units weekly for bone health. -Continue Sertraline150 mg po Q daily for depression/anxiety -Continue Buspar 15 mg po bid for anxiety.  -Continue paliperidone 6 mg po qd for mood  stabilization -Continue Trazodone 50 mg nightly for insomnia  -Continue Nicoderm 21 mg topically Q 24 hrs for nicotine withdrawal management. -Continue vitamin B12 1000 mcg IM weekly for 4 weeks then monthly -Continue Melatonin 5 mg nightly for sleep -Continue Protonix EC 40 mg p.o. daily for GERD -Continue MiraLAX 17 g p.o. PRN for constipation -Continue hydroxyzine 25 mg p.o. 3 times daily as needed for anxiety -Continue Ensure nutritional shakes TID in between meals  -Previously discontinued Hydroxyzine 50 mg nightly-hypotension in the mornings  Safety and Monitoring: Voluntary admission to inpatient psychiatric unit for safety, stabilization and treatment Daily contact with patient to assess and evaluate symptoms and progress in treatment Patient's case to be discussed in multi-disciplinary team meeting Observation Level : q15 minute checks Vital signs: q12 hours Precautions: Safety   Discharge Planning: Social work and case management to assist with discharge planning and identification of hospital follow-up needs prior to discharge Estimated LOS: Unknown at this time. Discharge Concerns: Need to establish a safety plan; Medication compliance and effectiveness Discharge Goals: Return home with outpatient referrals for mental health follow-up including medication management/psychotherapy No legal guardian, has payee  [name], NP.Marland Kitchen [id number]. 02/12/2023, 2:22 PM Patient ID: Yolanda [name], female   DOB: July 08, 1976, 47 y.o.     Patient ID: Yolanda [name], female   DOB: [birth year], 47 y.o.   MRN: [id number]

## 2023-02-12 NOTE — B[facility] Group Notes (Signed)
Spiritual care group on grief and loss facilitated by chaplain Burnis Kingfisher MDiv, BCC  Group Goal:  Support / Education around grief and loss Members engage in facilitated group support and psycho-social education.  Group Description:  Following introductions and group rules, group members engaged in facilitated group dialog and support around topic of loss, with particular support around experiences of loss in their lives. Group Identified types of loss (relationships / self / things) and identified patterns, circumstances, and changes that precipitate losses. Reflected on thoughts / feelings around loss, normalized grief responses, and recognized variety in grief experience.   Group noted Worden's four tasks of grief in discussion.  Group drew on Adlerian / Rogerian, narrative, MI, Patient Progress:  Yolanda [name] arrived to group late.  Attentive to conversation.  Did not engage verbally in facilitated dialog.

## 2023-02-12 NOTE — BH IP Treatment Plan (Signed)
Interdisciplinary Treatment and Diagnostic Plan Update  02/12/2023 Time of Session: 1030am Yolanda [name] MRN: [id number]  Principal Diagnosis: Schizoaffective disorder, depressive type (HCC)  Secondary Diagnoses: Principal Problem:   Schizoaffective disorder, depressive type (HCC) Active Problems:   GERD (gastroesophageal reflux disease)   Intellectual disability   Tobacco use disorder   Current Medications:  Current Facility-Administered Medications  Medication Dose Route Frequency Provider Last Rate Last Admin   acetaminophen (TYLENOL) tablet 650 mg  650 mg Oral Q6H PRN [name], NP   650 mg at 02/11/23 1727   alum & mag hydroxide-simeth (MAALOX/MYLANTA) 200-200-20 MG/5ML suspension 30 mL  30 mL Oral Q4H PRN [name], NP   30 mL at 01/28/23 1530   busPIRone (BUSPAR) tablet 15 mg  15 mg Oral BID [name], NP   15 mg at 02/12/23 0834   cyanocobalamin (VITAMIN B12) injection 1,000 mcg  1,000 mcg Intramuscular Q30 days Sarita Bottom, MD   1,000 mcg at 02/02/23 1238   diphenhydrAMINE (BENADRYL) capsule 50 mg  50 mg Oral TID PRN [name], NP   50 mg at 01/15/23 1211   Or   diphenhydrAMINE (BENADRYL) injection 50 mg  50 mg Intramuscular TID PRN [name], NP       feeding supplement (ENSURE ENLIVE / ENSURE PLUS) liquid 237 mL  237 mL Oral TID BM Nkwenti, Doris, NP   237 mL at 02/12/23 1448   haloperidol (HALDOL) tablet 5 mg  5 mg Oral TID PRN [name], NP   5 mg at 01/15/23 1211   Or   haloperidol lactate (HALDOL) injection 5 mg  5 mg Intramuscular TID PRN [name], NP       hydrOXYzine (ATARAX) tablet 25 mg  25 mg Oral TID PRN [name], DO   25 mg at 02/11/23 1727   LORazepam (ATIVAN) tablet 2 mg  2 mg Oral TID PRN [name], NP   2 mg at 01/15/23 1211   Or   LORazepam (ATIVAN) injection 2 mg  2 mg Intramuscular TID PRN [name], NP       magnesium hydroxide (MILK OF MAGNESIA) suspension 30 mL  30 mL Oral Daily PRN [name], NP        melatonin tablet 5 mg  5 mg Oral QHS Nkwenti, Doris, NP   5 mg at 02/11/23 2127   nicotine (NICODERM CQ - dosed in mg/24 hours) patch 21 mg  21 mg Transdermal Daily [name], DO   21 mg at 02/10/23 0900   paliperidone (INVEGA) 24 hr tablet 6 mg  6 mg Oral Daily Nkwenti, Tyler Aas, NP   6 mg at 02/11/23 2126   pantoprazole (PROTONIX) EC tablet 40 mg  40 mg Oral Daily [name], NP   40 mg at 02/12/23 0834   polyethylene glycol (MIRALAX / GLYCOLAX) packet 17 g  17 g Oral Daily PRN [name], NP   17 g at 02/10/23 1707   sertraline (ZOLOFT) tablet 150 mg  150 mg Oral Daily Massengill, Harrold Donath, MD   150 mg at 02/12/23 0834   traZODone (DESYREL) tablet 50 mg  50 mg Oral QHS [name], NP   50 mg at 02/11/23 2126   PTA Medications: Medications Prior to Admission  Medication Sig Dispense Refill Last Dose   busPIRone (BUSPAR) 15 MG tablet Take 15 mg by mouth 2 (two) times daily. (Patient not taking: Reported on 12/19/2022)      paliperidone (INVEGA SUSTENNA) 156 MG/ML  SUSY injection Inject 156 mg into the muscle once. (Patient not taking: Reported on 12/19/2022)      sertraline (ZOLOFT) 50 MG tablet Take 150 mg by mouth daily. (Patient not taking: Reported on 12/19/2022)      traZODone (DESYREL) 100 MG tablet Take 100 mg by mouth at bedtime as needed for sleep. (Patient not taking: Reported on 11/12/2022)       Patient Stressors: Medication change or noncompliance    Patient Strengths: Forensic psychologist fund of knowledge   Treatment Modalities: Medication Management, Group therapy, Case management,  1 to 1 session with clinician, Psychoeducation, Recreational therapy.   Physician Treatment Plan for Primary Diagnosis: Schizoaffective disorder, depressive type (HCC) Long Term Goal(s): Improvement in symptoms so as ready for discharge   Short Term Goals: Ability to identify and develop effective coping behaviors will improve Ability to maintain clinical measurements  within normal limits will improve Compliance with prescribed medications will improve Ability to identify triggers associated with substance abuse/mental health issues will improve Ability to identify changes in lifestyle to reduce recurrence of condition will improve Ability to verbalize feelings will improve Ability to disclose and discuss suicidal ideas Ability to demonstrate self-control will improve  Medication Management: Evaluate patient's response, side effects, and tolerance of medication regimen.  Therapeutic Interventions: 1 to 1 sessions, Unit Group sessions and Medication administration.  Evaluation of Outcomes: Progressing  Physician Treatment Plan for Secondary Diagnosis: Principal Problem:   Schizoaffective disorder, depressive type (HCC) Active Problems:   GERD (gastroesophageal reflux disease)   Intellectual disability   Tobacco use disorder  Long Term Goal(s): Improvement in symptoms so as ready for discharge   Short Term Goals: Ability to identify and develop effective coping behaviors will improve Ability to maintain clinical measurements within normal limits will improve Compliance with prescribed medications will improve Ability to identify triggers associated with substance abuse/mental health issues will improve Ability to identify changes in lifestyle to reduce recurrence of condition will improve Ability to verbalize feelings will improve Ability to disclose and discuss suicidal ideas Ability to demonstrate self-control will improve     Medication Management: Evaluate patient's response, side effects, and tolerance of medication regimen.  Therapeutic Interventions: 1 to 1 sessions, Unit Group sessions and Medication administration.  Evaluation of Outcomes: Progressing   RN Treatment Plan for Primary Diagnosis: Schizoaffective disorder, depressive type (HCC) Long Term Goal(s): Knowledge of disease and therapeutic regimen to maintain health will  improve  Short Term Goals: Ability to remain free from injury will improve, Ability to verbalize frustration and anger appropriately will improve, Ability to demonstrate self-control, Ability to participate in decision making will improve, Ability to verbalize feelings will improve, Ability to disclose and discuss suicidal ideas, Ability to identify and develop effective coping behaviors will improve, and Compliance with prescribed medications will improve  Medication Management: RN will administer medications as ordered by provider, will assess and evaluate patient's response and provide education to patient for prescribed medication. RN will report any adverse and/or side effects to prescribing provider.  Therapeutic Interventions: 1 on 1 counseling sessions, Psychoeducation, Medication administration, Evaluate responses to treatment, Monitor vital signs and CBGs as ordered, Perform/monitor CIWA, COWS, AIMS and Fall Risk screenings as ordered, Perform wound care treatments as ordered.  Evaluation of Outcomes: Progressing   LCSW Treatment Plan for Primary Diagnosis: Schizoaffective disorder, depressive type (HCC) Long Term Goal(s): Safe transition to appropriate next level of care at discharge, Engage patient in therapeutic group addressing interpersonal concerns.  Short Term  Goals: Engage patient in aftercare planning with referrals and resources, Increase social support, Increase ability to appropriately verbalize feelings, Increase emotional regulation, Facilitate acceptance of mental health diagnosis and concerns, Facilitate patient progression through stages of change regarding substance use diagnoses and concerns, Identify triggers associated with mental health/substance abuse issues, and Increase skills for wellness and recovery  Therapeutic Interventions: Assess for all discharge needs, 1 to 1 time with Social worker, Explore available resources and support systems, Assess for adequacy in  community support network, Educate family and significant other(s) on suicide prevention, Complete Psychosocial Assessment, Interpersonal group therapy.  Evaluation of Outcomes: Progressing   Progress in Treatment: Attending groups: Yes. Participating in groups: Yes. Taking medication as prescribed: Yes. Toleration medication: Yes. Family/Significant other contact made: contact made with Mother but will not contract for safety Patient understands diagnosis: No. Discussing patient identified problems/goals with staff: Yes. Medical problems stabilized or resolved: Yes. Denies suicidal/homicidal ideation: Yes. Issues/concerns per patient self-inventory: Yes. Other: no  New problem(s) identified: No, Describe:  none reported  New Short Term/Long Term Goal(s): medication stabilization, elimination of SI thoughts, development of comprehensive mental wellness plan.       Patient Goals:  coping skills  Discharge Plan or Barriers: CSW will continue to follow and assess for appropriate referrals and possible discharge planning.       Reason for Continuation of Hospitalization: Medication stabilization  Estimated Length of Stay:  Last 3 [place] Suicide Severity Risk Score: Flowsheet Row [facility] (Current[facility] from [facility]19/2022 in [facility] ED from 11/12/2022 in [facility] Emergency Department at [facility]  C-SSRS RISK CATEGORY High Risk High Risk Moderate Risk       Last [name] 2/9 Scores:     No data to display          Scribe for Treatment Team: Charise Killian 02/12/2023 3:17 PM

## 2023-02-12 NOTE — [facility] Group Notes (Signed)
[facility] Group Notes:  (Nursing/MHT/Case Management/Adjunct)  Date:  02/12/2023  Time:  2000  Type of Therapy:   wrap up group  Participation Level:  Active  Participation Quality:  Appropriate, Attentive, Sharing, and Supportive  Affect:  Appropriate  Cognitive:  Alert  Insight:  Improving  Engagement in Group:  Engaged  Modes of Intervention:  Clarification, Education, and Support  Summary of Progress/Problems: Positive thinking and positive change were discussed.   Yolanda [name] S 02/12/2023, 9:05 PM

## 2023-02-12 NOTE — Progress Notes (Signed)
Chaplain engaged in a brief follow-up with Yolanda [name]. She voiced that she wanted to go to group but that she wasn't feeling well. Chaplain offered supportive presence and will follow-up.     02/12/23 1500  Spiritual Encounters  Type of Visit Follow up  Care provided to: Patient

## 2023-02-12 NOTE — Progress Notes (Signed)
   02/12/23 0615  15 Minute Checks  Location Bedroom  Visual Appearance Calm  Behavior Composed  Sleep (Behavioral Health Patients Only)  Calculate sleep? (Click Yes once per 24 hr at 0600 safety check) Yes  Documented sleep last 24 hours 8.5

## 2023-02-12 NOTE — Progress Notes (Signed)
   02/11/23 2125  Psych Admission Type (Psych Patients Only)  Admission Status Involuntary  Psychosocial Assessment  Patient Complaints Depression;Sadness  Eye Contact Fair  Facial Expression Anxious  Affect Flat  Speech Slow  Interaction Childlike  Motor Activity Slow  Appearance/Hygiene Disheveled  Behavior Characteristics Appropriate to situation  Mood Depressed;Sad  Thought Process  Coherency WDL  Content WDL  Delusions None reported or observed  Perception WDL  Hallucination None reported or observed  Judgment Poor  Confusion WDL  Danger to Self  Current suicidal ideation? Denies  Self-Injurious Behavior No self-injurious ideation or behavior indicators observed or expressed   Agreement Not to Harm Self Yes  Description of Agreement verbal  Danger to Others  Danger to Others None reported or observed

## 2023-02-12 NOTE — Plan of Care (Signed)
  Problem: Activity: Goal: Interest or engagement in leisure activities will improve Outcome: Progressing   Problem: Coping: Goal: Coping ability will improve Outcome: Progressing   Problem: Coping: Goal: Coping ability will improve Outcome: Progressing Goal: Will verbalize feelings Outcome: Progressing

## 2023-02-12 NOTE — Progress Notes (Signed)
   02/12/23 0900  Psych Admission Type (Psych Patients Only)  Admission Status Involuntary  Psychosocial Assessment  Patient Complaints Sadness  Eye Contact Fair  Facial Expression Anxious  Affect Flat  Speech Slow  Interaction Childlike  Motor Activity Slow  Appearance/Hygiene Disheveled  Behavior Characteristics Appropriate to situation  Mood Depressed;Sad  Thought Process  Coherency WDL  Content WDL  Delusions None reported or observed  Perception WDL  Hallucination None reported or observed  Judgment Poor  Confusion None  Danger to Self  Current suicidal ideation? Denies  Self-Injurious Behavior No self-injurious ideation or behavior indicators observed or expressed   Agreement Not to Harm Self Yes  Description of Agreement verbal  Danger to Others  Danger to Others None reported or observed

## 2023-02-12 NOTE — Progress Notes (Signed)
On assessment, patient is tearful, anxious, and sad.  Patient denies pain.  Patient reports that some of her tears are because she missed her grandson's birthday.  Patient attended group and got a snack.  Patient is medication compliant.  Patient denies SI/HI/AVH.  Patient is safe on the unit with q 15 minute safety checks.

## 2023-02-13 DIAGNOSIS — F251 Schizoaffective disorder, depressive type: Secondary | ICD-10-CM | POA: Diagnosis not present

## 2023-02-13 MED ORDER — WHITE PETROLATUM EX OINT
TOPICAL_OINTMENT | CUTANEOUS | Status: AC
  Filled 2023-02-13: qty 5

## 2023-02-13 NOTE — Progress Notes (Signed)
   02/12/23 2128  Psych Admission Type (Psych Patients Only)  Admission Status Involuntary  Psychosocial Assessment  Patient Complaints Sadness;Crying spells  Eye Contact Fair  Facial Expression Anxious  Affect Flat  Speech Slow  Interaction Childlike  Motor Activity Slow  Appearance/Hygiene Disheveled  Behavior Characteristics Appropriate to situation  Mood Depressed;Sad  Thought Process  Coherency WDL  Content WDL  Delusions None reported or observed  Perception WDL  Hallucination None reported or observed  Judgment Poor  Confusion WDL  Danger to Self  Current suicidal ideation? Denies  Self-Injurious Behavior No self-injurious ideation or behavior indicators observed or expressed   Agreement Not to Harm Self Yes  Description of Agreement verbal  Danger to Others  Danger to Others None reported or observed

## 2023-02-13 NOTE — Group Note (Signed)
Recreation Therapy Group Note   Group Topic:Team Building  Group Date: 02/13/2023 Start Time: 0930 End Time: 1000 Facilitators: Marena Witts-McCall, LRT,CTRS Location: [address] Dayroom   Goal Area(s) Addresses:  Patient will effectively work with peer towards shared goal.  Patient will identify skills used to make activity successful.  Patient will identify how skills used during activity can be applied to reach post d/c goals.   Group Description: [employer]. In teams of 5-6, patients were given 11 craft pipe cleaners. Using the materials provided, patients were instructed to compete again the opposing team(s) to build the tallest free-standing structure from floor level. The activity was timed; difficulty increased by Clinical research associate as Production designer, theatre/television/film continued.  Systematically resources were removed with additional directions for example, placing one arm behind their back, working in silence, and shape stipulations. LRT facilitated post-activity discussion reviewing team processes and necessary communication skills involved in completion. Patients were encouraged to reflect how the skills utilized, or not utilized, in this activity can be incorporated to positively impact support systems post discharge.   Clinical Observations/Individualized Feedback: Due to limited staffing, group did not occur because LRT was helping on 500 hall by opening the dayroom for the patients.    Plan: Continue to engage patient in RT group sessions 2-3x/week.   Mileena Rothenberger-McCall, LRT,CTRS 02/13/2023 1:02 PM

## 2023-02-13 NOTE — Progress Notes (Signed)
   02/13/23 0545  15 Minute Checks  Location Bedroom  Visual Appearance Calm  Behavior Sleeping  Sleep (Behavioral Health Patients Only)  Calculate sleep? (Click Yes once per 24 hr at 0600 safety check) Yes  Documented sleep last 24 hours 7.5

## 2023-02-13 NOTE — Progress Notes (Addendum)
   02/13/23 2045  Psych Admission Type (Psych Patients Only)  Admission Status Involuntary  Psychosocial Assessment  Patient Complaints Crying spells;Depression  Eye Contact Brief  Facial Expression Anxious  Affect Anxious  Speech Slow (some developmental delay)  Interaction Childlike  Motor Activity Slow  Appearance/Hygiene Unremarkable  Behavior Characteristics Cooperative  Mood Depressed;Sad  Thought Process  Coherency WDL  Content WDL  Delusions None reported or observed  Perception WDL  Hallucination None reported or observed  Judgment Limited  Confusion None  Danger to Self  Current suicidal ideation? Denies  Agreement Not to Harm Self Yes  Description of Agreement verbal  Danger to Others  Danger to Others None reported or observed   Progress note   D: Pt seen in her room. Pt denies SI, HI, AVH. Pt rates pain  0/10. Pt rates anxiety  0/10 and endorses a little bit of depression. Pt appears to be developmentally delayed. Pt is drinking an Ensure. Pt is tearful at times, speaking about her daughter who has returned home and her family. Pt states she was staying in a shelter before she was admitted to [facility]. "I have stayed at a group home before but the lady was yelling at me. She said I could smoke outside and then changed her mind." Pt states that her family does not her living with them. She hopes to be placed in a group home. Pt states she is sleeping and eating well. Attending groups and taking her medication as prescribed. One good thing about her day today is that staff styled her hair for her. No other concerns noted at this time.  A: Pt provided support and encouragement. Pt given scheduled medication as prescribed. PRNs as appropriate. Q15 min checks for safety.   R: Pt safe on the unit. Will continue to monitor.

## 2023-02-13 NOTE — [facility] Group Notes (Signed)
Adult Psychoeducational Group Note  Date:  02/13/2023 Time:  9:34 PM  Group Topic/Focus:  Wrap-Up Group:   The focus of this group is to help patients review their daily goal of treatment and discuss progress on daily workbooks.  Participation Level:  Did Not Attend  Christ Kick 02/13/2023, 9:34 PM

## 2023-02-13 NOTE — Progress Notes (Signed)
   02/13/23 1600  Psych Admission Type (Psych Patients Only)  Admission Status Involuntary  Psychosocial Assessment  Patient Complaints Sadness  Eye Contact Fair  Facial Expression Anxious  Affect Flat  Speech Slow  Interaction Childlike  Motor Activity Slow  Appearance/Hygiene Disheveled  Behavior Characteristics Appropriate to situation  Mood Depressed;Sad  Thought Process  Coherency WDL  Content WDL  Delusions None reported or observed  Perception WDL  Hallucination None reported or observed  Judgment Poor  Confusion None  Danger to Self  Current suicidal ideation? Denies  Self-Injurious Behavior No self-injurious ideation or behavior indicators observed or expressed   Agreement Not to Harm Self Yes  Description of Agreement verbal  Danger to Others  Danger to Others None reported or observed

## 2023-02-13 NOTE — Group Note (Unsigned)
Date:  02/13/2023 Time:  9:09 AM  Group Topic/Focus:  Goals Group:   The focus of this group is to help patients establish daily goals to achieve during treatment and discuss how the patient can incorporate goal setting into their daily lives to aide in recovery.     Participation Level:  {BHH PARTICIPATION UJWJX:91478}  Participation Quality:  {B[facility] PARTICIPATION QUALITY:22265}  Affect:  {BHH AFFECT:22266}  Cognitive:  {BHH COGNITIVE:22267}  Insight: {BHH Insight2:20797}  Engagement in Group:  {BHH ENGAGEMENT IN GNFAO:13086}  Modes of Intervention:  {BHH MODES OF INTERVENTION:22269}  Additional Comments:  ***  Harriet Masson 02/13/2023, 9:09 AM

## 2023-02-13 NOTE — Progress Notes (Signed)
Patient got up to go to group.

## 2023-02-13 NOTE — Progress Notes (Signed)
Patient went to the gym for recreation time.

## 2023-02-13 NOTE — Progress Notes (Signed)
Patient ID: Yolanda [name], female   DOB: September 06, 1975, 47 y.o.   MRN: [id number] [facility] MD Progress Note  02/13/2023 1:30 PM Mystie NAIOMY PERCY  MRN:  [id number]  Reason for admission: This is the first psychiatric admission in this [facility] in 12 years for this 55 AA female with an extensive hx of mental illnesses & probable polysubstance use disorders. She is admitted to the [facility] from the [facility] hospital with complain of worsening suicidal ideations with plan to stab herself. Per chart review, patient apparently reported at the ED that she has been depressed for a while & has not been taking her mental health medications. After medical evaluation.clearance, she was transferred to the [facility] for further psychiatric evaluation/treatments.   APS report on 12/31/22 Has payee, no legal guardian Continues to await for placement.  24 hr chart review: V/S WNL. Pt remains compliant with medications, isolative to the room & continuing to require positive verbal reinforcements to get out of her room. no behavioral issues reported overnight.   Daily notes:  Pt continues to present with flat affect & depressed mood. She says she is doing okay. Her attention to personal hygiene & grooming remain poor. Patient continues to require encouragement & reinforcements to attend to her personal hygiene. Her eye contact is good, speech remains garbled & patient is able to make her needs known.  Pt currently denies SI/HI/AVH or paranoia. She continues to anticipate some good goods about placement. The DSS are now involved in finding patient a place to live after discharge as she is basically homeless.  Pt has continued to verbalize her frutration with continuing to remain hospitalized. We have educated her that we are awaiting DSS to coordinate with CSW to place her in a safe living arrangement outside of the hospital environment, but she is seeming not to understanding this. She verbalizes feelings of abandonment by her family.  She reports a fair sleep quality last night, staff reports that her sleep quality was good. She reports a good appetite, denies being in any physical pain, states that she is moving her bowels well.  We're continuing medications as listed below. No TD/EPS type symptoms found on assessment, and pt denies any feelings of stiffness. AIMS: 0.   Principal Problem: Schizoaffective disorder, depressive type (HCC)   Diagnosis: Principal Problem:   Schizoaffective disorder, depressive type (HCC) Active Problems:   GERD (gastroesophageal reflux disease)   Intellectual disability   Tobacco use disorder  Past Psychiatric History: Patient is not forth coming with the answers to the assessment questions. She is refusing to provide information about her mental health hx or symptoms.   Past Medical History:  Past Medical History:  Diagnosis Date   Bipolar affect, depressed (HCC)    Constipation 08/17/2022   Depression    Falls 07/21/2022   Fracture of femoral neck, right, closed (HCC) 01/17/2022   Herpes simplex 08/22/2017   Open fracture dislocation of right elbow joint 01/17/2022    Past Surgical History:  Procedure Laterality Date   NO PAST SURGERIES     SALPINGECTOMY     Family History: History reviewed. No pertinent family history.  Family Psychiatric  History: See H&P.  Social History:  Social History   Substance and Sexual Activity  Alcohol Use Yes     Social History   Substance and Sexual Activity  Drug Use Yes   Types: Cocaine, Marijuana    Social History   Socioeconomic History   Marital status: Single  Spouse name: Not on file   Number of children: Not on file   Years of education: Not on file   Highest education level: Not on file  Occupational History   Not on file  Tobacco Use   Smoking status: Every Day   Smokeless tobacco: Not on file  Substance and Sexual Activity   Alcohol use: Yes   Drug use: Yes    Types: Cocaine, Marijuana   Sexual activity: Yes   Other Topics Concern   Not on file  Social History Narrative   Not on file   Social Determinants of Health   Financial Resource Strain: Low Risk  (09/12/2022)   Received from [facility]   Overall Financial Resource Strain (CARDIA)    Difficulty of Paying Living Expenses: Not hard at all  Food Insecurity: Patient Declined (12/20/2022)   Hunger Vital Sign    Worried About Running Out of Food in the Last Year: Patient declined    Ran Out of Food in the Last Year: Patient declined  Transportation Needs: No Transportation Needs (12/20/2022)   PRAPARE - Administrator, Civil Service (Medical): No    Lack of Transportation (Non-Medical): No  Physical Activity: Not on file  Stress: No Stress Concern Present (07/17/2022)   Received from [facility] of Occupational Health - Occupational Stress Questionnaire    Feeling of Stress : Not at all  Social Connections: Unknown (07/16/2022)   Received from [facility]   Social Network    Social Network: Not on file   Current Medications: Current Facility-Administered Medications  Medication Dose Route Frequency Provider Last Rate Last Admin   acetaminophen (TYLENOL) tablet 650 mg  650 mg Oral Q6H PRN [name], NP   650 mg at 02/11/23 1727   alum & mag hydroxide-simeth (MAALOX/MYLANTA) 200-200-20 MG/5ML suspension 30 mL  30 mL Oral Q4H PRN [name], NP   30 mL at 01/28/23 1530   busPIRone (BUSPAR) tablet 15 mg  15 mg Oral BID [name], NP   15 mg at 02/13/23 1150   cyanocobalamin (VITAMIN B12) injection 1,000 mcg  1,000 mcg Intramuscular Q30 days [name], Nadir, MD   1,000 mcg at 02/02/23 1238   diphenhydrAMINE (BENADRYL) capsule 50 mg  50 mg Oral TID PRN [name], NP   50 mg at 01/15/23 1211   Or   diphenhydrAMINE (BENADRYL) injection 50 mg  50 mg Intramuscular TID PRN [name], NP       feeding supplement (ENSURE ENLIVE / ENSURE PLUS) liquid 237  mL  237 mL Oral TID BM Nkwenti, Doris, NP   237 mL at 02/13/23 1152   haloperidol (HALDOL) tablet 5 mg  5 mg Oral TID PRN [name], NP   5 mg at 01/15/23 1211   Or   haloperidol lactate (HALDOL) injection 5 mg  5 mg Intramuscular TID PRN [name], NP       hydrOXYzine (ATARAX) tablet 25 mg  25 mg Oral TID PRN [name], DO   25 mg at 02/12/23 2127   LORazepam (ATIVAN) tablet 2 mg  2 mg Oral TID PRN [name], NP   2 mg at 01/15/23 1211   Or   LORazepam (ATIVAN) injection 2 mg  2 mg Intramuscular TID PRN [name], NP       magnesium hydroxide (MILK OF MAGNESIA) suspension 30 mL  30 mL Oral Daily PRN [name],  NP   30 mL at 02/12/23 1900   melatonin tablet 5 mg  5 mg Oral QHS Nkwenti, Doris, NP   5 mg at 02/12/23 2126   nicotine (NICODERM CQ - dosed in mg/24 hours) patch 21 mg  21 mg Transdermal Daily [name], DO   21 mg at 02/10/23 0900   paliperidone (INVEGA) 24 hr tablet 6 mg  6 mg Oral Daily [name], NP   6 mg at 02/12/23 2126   pantoprazole (PROTONIX) EC tablet 40 mg  40 mg Oral Daily Margarita Bobrowski, Nicole Kindred I, NP   40 mg at 02/13/23 1149   polyethylene glycol (MIRALAX / GLYCOLAX) packet 17 g  17 g Oral Daily PRN [name], NP   17 g at 02/10/23 1707   sertraline (ZOLOFT) tablet 150 mg  150 mg Oral Daily Massengill, Harrold Donath, MD   150 mg at 02/13/23 1150   traZODone (DESYREL) tablet 50 mg  50 mg Oral QHS [name], NP   50 mg at 02/12/23 2126   Lab Results:  No results found for this or any previous visit (from the past 48 hour(s)).  Blood Alcohol level:  Lab Results  Component Value Date   ETH <10 12/19/2022   ETH <10 11/21/2019   Metabolic Disorder Labs: Lab Results  Component Value Date   HGBA1C 4.4 (L) 12/21/2022   MPG 79.58 12/21/2022   No results found for: "PROLACTIN" Lab Results  Component Value Date   CHOL 218 (H) 12/21/2022   TRIG 81 12/21/2022   HDL 53 12/21/2022   CHOLHDL 4.1 12/21/2022   VLDL 16 12/21/2022   LDLCALC 149  (H) 12/21/2022   LDLCALC 110 (H) 03/13/2011   Physical Findings: AIMS: Facial and Oral Movements Muscles of Facial Expression: None, normal Lips and Perioral Area: None, normal Jaw: None, normal Tongue: None, normal,Extremity Movements Upper (arms, wrists, hands, fingers): None, normal Lower (legs, knees, ankles, toes): None, normal, Trunk Movements Neck, shoulders, hips: None, normal, Overall Severity Severity of abnormal movements (highest score from questions above): None, normal Incapacitation due to abnormal movements: None, normal Patient's awareness of abnormal movements (rate only patient's report): No Awareness, Dental Status Current problems with teeth and/or dentures?: No Does patient usually wear dentures?: No   Musculoskeletal: Strength & Muscle Tone: within normal limits Gait & Station: normal Patient leans: N/A  Psychiatric Specialty Exam:  Presentation  General Appearance:  Lying down in bed, better hygiene noted  Eye Contact: Fair Speech: Other (comment) (garbled)  Speech Volume: Decreased  Handedness: Right  Mood and Affect  Mood: Depressed  Affect: Congruent  Thought Process  Thought Processes: Coherent  Descriptions of Associations:Intact  Orientation:Partial  Thought Content: Denies SI HI or AVH does not present responding to stimuli. Presentation consistent with moderate intellectual disability  History of Schizophrenia/Schizoaffective disorder:Yes  Duration of Psychotic Symptoms:Greater than six months  Hallucinations:Hallucinations: None  Ideas of Reference:None  Suicidal Thoughts:Suicidal Thoughts: No  Homicidal Thoughts:Homicidal Thoughts: No  Sensorium  Memory: Immediate Good  Judgment: Limited Insight: Limited Executive Functions  Concentration: Fair  Attention Span: Poor  Recall: Poor  Fund of Knowledge: Poor  Language: Fair  Psychomotor Activity  Psychomotor Activity: Psychomotor Activity:  Normal  Assets  Assets: Communication Skills  Sleep  Sleep: Sleep: Good  Physical Exam: Physical Exam Vitals and nursing note reviewed.  Constitutional:      General: She is not in acute distress.    Appearance: She is obese. She is not ill-appearing or diaphoretic.  HENT:  Head: Normocephalic.     Comments: Intellectual disability    Nose: No congestion.     Mouth/Throat:     Mouth: Mucous membranes are moist.     Pharynx: Oropharynx is clear.  Eyes:     Pupils: Pupils are equal, round, and reactive to light.  Cardiovascular:     Rate and Rhythm: Normal rate.     Pulses: Normal pulses.  Pulmonary:     Effort: Pulmonary effort is normal. No respiratory distress.  Abdominal:     Comments: Deferred  Genitourinary:    Comments: Deferred Musculoskeletal:        General: Normal range of motion.     Cervical back: Normal range of motion.  Skin:    General: Skin is warm.  Neurological:     General: No focal deficit present.     Mental Status: She is alert.  Psychiatric:        Behavior: Behavior normal.    Blood pressure 105/71, pulse (!) 57, temperature 98.2 F (36.8 C), temperature source Oral, resp. rate 19, height 4\' 11"  (1.499 m), weight 63 kg, SpO2 98%. Body mass index is 28.05 kg/m.  Treatment Plan Summary: Daily contact with patient to assess and evaluate symptoms and progress in treatment and Medication management.   Still waiting on safe disposition as patient would not be able to live independently given cognitive impairment. Recommend assisted living.   No medication side effects.  Because of urinary incontinence, recommended adult diapers to be worn.  Principal/active diagnoses:  Principal Problem:   Schizoaffective disorder, depressive type (HCC) Active Problems:   GERD (gastroesophageal reflux disease)   Intellectual disability   Tobacco use disorder (MOCA 16/30) moderate cognitive impairment probably related to moderate intellectual  disability.  Plan:  -Continue Vitamin D 50.000 units weekly for bone health. -Continue Sertraline150 mg po Q daily for depression/anxiety -Continue Buspar 15 mg po bid for anxiety.  -Continue paliperidone 6 mg po qd for mood stabilization -Continue Trazodone 50 mg nightly for insomnia  -Continue Nicoderm 21 mg topically Q 24 hrs for nicotine withdrawal management. -Continue vitamin B12 1000 mcg IM weekly for 4 weeks then monthly -Continue Melatonin 5 mg nightly for sleep -Continue Protonix EC 40 mg p.o. daily for GERD -Continue MiraLAX 17 g p.o. PRN for constipation -Continue hydroxyzine 25 mg p.o. 3 times daily as needed for anxiety -Continue Ensure nutritional shakes TID in between meals  -Previously discontinued Hydroxyzine 50 mg nightly-hypotension in the mornings  Safety and Monitoring: Voluntary admission to inpatient psychiatric unit for safety, stabilization and treatment Daily contact with patient to assess and evaluate symptoms and progress in treatment Patient's case to be discussed in multi-disciplinary team meeting Observation Level : q15 minute checks Vital signs: q12 hours Precautions: Safety   Discharge Planning: Social work and case management to assist with discharge planning and identification of hospital follow-up needs prior to discharge Estimated LOS: Unknown at this time. Discharge Concerns: Need to establish a safety plan; Medication compliance and effectiveness Discharge Goals: Return home with outpatient referrals for mental health follow-up including medication management/psychotherapy No legal guardian, has payee  Armandina Stammer, NP.Marland Kitchen [id number]. 02/13/2023, 1:30 PM Patient ID: AYUSHI HOLZEM, female   DOB: [birth year], 47 y.o.    Patient ID: JAMARA CHASIN, female   DOB: 1975/12/01, 47 y.o.   MRN: [id number] Patient ID: KALIAH COPPES, female   DOB: [birth year], 47 y.o.   MRN: [id number]

## 2023-02-13 NOTE — Progress Notes (Signed)
Patient came out of room, took her medications, and went to lunch.

## 2023-02-13 NOTE — Progress Notes (Signed)
Patient still in bed and not getting up when asked to for morning medication. She would not get up for goals group this  morning. Will continue to attempt to get patient out of her room.

## 2023-02-13 NOTE — Plan of Care (Signed)
  Problem: Education: Goal: Knowledge of the prescribed therapeutic regimen will improve Outcome: Progressing   Problem: Coping: Goal: Will verbalize feelings Outcome: Progressing   

## 2023-02-14 DIAGNOSIS — F251 Schizoaffective disorder, depressive type: Secondary | ICD-10-CM | POA: Diagnosis not present

## 2023-02-14 NOTE — Progress Notes (Signed)
   02/14/23 0557  15 Minute Checks  Location Bedroom  Visual Appearance Calm  Behavior Sleeping  Sleep (Behavioral Health Patients Only)  Calculate sleep? (Click Yes once per 24 hr at 0600 safety check) Yes  Documented sleep last 24 hours 8.75

## 2023-02-14 NOTE — Plan of Care (Signed)
  Problem: Activity: Goal: Interest or engagement in leisure activities will improve Outcome: Progressing Goal: Imbalance in normal sleep/wake cycle will improve Outcome: Progressing   Problem: Coping: Goal: Will verbalize feelings Outcome: Progressing   Problem: Safety: Goal: Ability to disclose and discuss suicidal ideas will improve Outcome: Progressing

## 2023-02-14 NOTE — Plan of Care (Signed)

## 2023-02-14 NOTE — Progress Notes (Addendum)
   02/14/23 2030  Psych Admission Type (Psych Patients Only)  Admission Status Involuntary  Psychosocial Assessment  Patient Complaints Crying spells;Sadness  Eye Contact Brief  Facial Expression Anxious  Affect Depressed  Speech Soft (garbled)  Interaction Isolative  Motor Activity Slow  Appearance/Hygiene Poor hygiene  Behavior Characteristics Anxious  Mood Anxious;Sad  Thought Process  Coherency WDL  Content WDL  Delusions None reported or observed  Perception WDL  Hallucination None reported or observed  Judgment Limited  Confusion None  Danger to Self  Current suicidal ideation? Denies  Self-Injurious Behavior No self-injurious ideation or behavior indicators observed or expressed   Agreement Not to Harm Self Yes  Description of Agreement verbal  Danger to Others  Danger to Others None reported or observed   Progress note   D: Pt seen in her room in the bed. Pt denies SI, HI, AVH. Pt rates pain  0/10. Pt is tearful when this writer enters the room. Her speech is garbled but can be understood when she slows down. Pt said that another pt called her "retarded" today. She says that other people in her life have told her that. "I want to be able to do things like others." Pt said that she wants to get her GED. She didn't think she could do it. Offered encouragement for pt to get out of bed and come to group and get her medication at the window. Pt complied.  Pt also wants provider to increase the dosages of her daytime medications because she feels they are not working. No other concerns noted at this time.  A: Pt provided support and encouragement. Pt given scheduled medication as prescribed. PRNs as appropriate. Q15 min checks for safety.   R: Pt safe on the unit. Will continue to monitor.

## 2023-02-14 NOTE — Progress Notes (Signed)
Patient ID: Yolanda [name], female   DOB: 20-Aug-1975, 47 y.o.   MRN: [id number] [facility] MD Progress Note  02/14/2023 3:08 PM Yolanda [name]  MRN:  [id number]  Reason for admission: This is the first psychiatric admission in this [facility] in 12 years for this 58 AA female with an extensive hx of mental illnesses & probable polysubstance use disorders. She is admitted to the [facility] from the [facility] hospital with complain of worsening suicidal ideations with plan to stab herself. Per chart review, patient apparently reported at the ED that she has been depressed for a while & has not been taking her mental health medications. After medical evaluation.clearance, she was transferred to the [facility] for further psychiatric evaluation/treatments.   APS report on 12/31/22 Has payee, no legal guardian Continues to await for placement.  24 hr chart review: V/S WNL. Pt remains compliant with medications, isolative to the room & continuing to require positive verbal reinforcements to get out of her room. no behavioral issues reported overnight.   Daily notes: Zekiah is seen in her room. Chart reviewed. The chart findings discussed with the treatment team. There have not been much significant changes noted on patient's mood or mannerisms. She presents alert, oriented to place & situation. She appears cheerful today because someone braided her hair yesterday. She looks very pretty in her breaded hair. She reports while smiling, "I'm doing good today. I am still worried about where to go after discharge. The social worker & the DSS have not been able to find me a place yet. I have not been out of my room today because I do not have any clean clothes to wear. I think my clothes are being washed. I will come out of my room once I have something to wear". Patient currently denies any SIHI, AVH, delusional thoughts or paranoia. She does not appear to be responding to any internal stimuli. At the present time, patient have reached  her baseline, mentally. However, because of her intellectual limitations, the DSS is currently/actively trying to find this patient a suitable place to reside in after discharge. There are no changes made on the patient's plan of care. Will continue as already in progress. Vital signs remain stable.  Pt has continued to verbalize her frutration with continuing to remain hospitalized. We have educated her that we are awaiting DSS to coordinate with CSW to place her in a safe living arrangement outside of the hospital environment, but she is seeming not to understanding this. She verbalizes feelings of abandonment by her family. She reports a fair sleep quality last night, staff reports that her sleep quality was good. She reports a good appetite, denies being in any physical pain, states that she is moving her bowels well.  We're continuing medications as listed below. No TD/EPS type symptoms found on assessment, and pt denies any feelings of stiffness. AIMS: 0.   Principal Problem: Schizoaffective disorder, depressive type (HCC)   Diagnosis: Principal Problem:   Schizoaffective disorder, depressive type (HCC) Active Problems:   GERD (gastroesophageal reflux disease)   Intellectual disability   Tobacco use disorder  Past Psychiatric History: Patient is not forth coming with the answers to the assessment questions. She is refusing to provide information about her mental health hx or symptoms.   Past Medical History:  Past Medical History:  Diagnosis Date   Bipolar affect, depressed (HCC)    Constipation 08/17/2022   Depression    Falls 07/21/2022   Fracture of femoral neck,  right, closed (HCC) 01/17/2022   Herpes simplex 08/22/2017   Open fracture dislocation of right elbow joint 01/17/2022    Past Surgical History:  Procedure Laterality Date   NO PAST SURGERIES     SALPINGECTOMY     Family History: History reviewed. No pertinent family history.  Family Psychiatric  History: See  H&P.  Social History:  Social History   Substance and Sexual Activity  Alcohol Use Yes     Social History   Substance and Sexual Activity  Drug Use Yes   Types: Cocaine, Marijuana    Social History   Socioeconomic History   Marital status: Single    Spouse name: Not on file   Number of children: Not on file   Years of education: Not on file   Highest education level: Not on file  Occupational History   Not on file  Tobacco Use   Smoking status: Every Day   Smokeless tobacco: Not on file  Substance and Sexual Activity   Alcohol use: Yes   Drug use: Yes    Types: Cocaine, Marijuana   Sexual activity: Yes  Other Topics Concern   Not on file  Social History Narrative   Not on file   Social Determinants of Health   Financial Resource Strain: Low Risk  (09/12/2022)   Received from [facility]   Overall Financial Resource Strain (CARDIA)    Difficulty of Paying Living Expenses: Not hard at all  Food Insecurity: Patient Declined (12/20/2022)   Hunger Vital Sign    Worried About Running Out of Food in the Last Year: Patient declined    Ran Out of Food in the Last Year: Patient declined  Transportation Needs: No Transportation Needs (12/20/2022)   PRAPARE - Administrator, Civil Service (Medical): No    Lack of Transportation (Non-Medical): No  Physical Activity: Not on file  Stress: No Stress Concern Present (07/17/2022)   Received from [facility] - Occupational Stress Questionnaire    Feeling of Stress : Not at all  Social Connections: Unknown (07/16/2022)   Received from [facility], [facility]   Social Network    Social Network: Not on file   Current Medications: Current Facility-Administered Medications  Medication Dose Route Frequency Provider Last Rate Last Admin   acetaminophen (TYLENOL) tablet 650 mg  650 mg Oral Q6H PRN [name], NP   650 mg at 02/11/23 1727    alum & mag hydroxide-simeth (MAALOX/MYLANTA) 200-200-20 MG/5ML suspension 30 mL  30 mL Oral Q4H PRN [name], NP   30 mL at 01/28/23 1530   busPIRone (BUSPAR) tablet 15 mg  15 mg Oral BID [name], NP   15 mg at 02/14/23 0849   cyanocobalamin (VITAMIN B12) injection 1,000 mcg  1,000 mcg Intramuscular Q30 days Sarita Bottom, MD   1,000 mcg at 02/02/23 1238   diphenhydrAMINE (BENADRYL) capsule 50 mg  50 mg Oral TID PRN [name], NP   50 mg at 01/15/23 1211   Or   diphenhydrAMINE (BENADRYL) injection 50 mg  50 mg Intramuscular TID PRN [name], NP       feeding supplement (ENSURE ENLIVE / ENSURE PLUS) liquid 237 mL  237 mL Oral TID BM Nkwenti, Doris, NP   237 mL at 02/14/23 1141   haloperidol (HALDOL) tablet 5 mg  5 mg Oral TID PRN [name], NP   5 mg at 01/15/23 1211  Or   haloperidol lactate (HALDOL) injection 5 mg  5 mg Intramuscular TID PRN [name], NP       hydrOXYzine (ATARAX) tablet 25 mg  25 mg Oral TID PRN [name], DO   25 mg at 02/13/23 2101   LORazepam (ATIVAN) tablet 2 mg  2 mg Oral TID PRN [name], NP   2 mg at 01/15/23 1211   Or   LORazepam (ATIVAN) injection 2 mg  2 mg Intramuscular TID PRN [name], NP       magnesium hydroxide (MILK OF MAGNESIA) suspension 30 mL  30 mL Oral Daily PRN [name], NP   30 mL at 02/12/23 1900   melatonin tablet 5 mg  5 mg Oral QHS Nkwenti, Doris, NP   5 mg at 02/13/23 2101   nicotine (NICODERM CQ - dosed in mg/24 hours) patch 21 mg  21 mg Transdermal Daily [name], DO   21 mg at 02/14/23 0850   paliperidone (INVEGA) 24 hr tablet 6 mg  6 mg Oral Daily [name], NP   6 mg at 02/13/23 2101   pantoprazole (PROTONIX) EC tablet 40 mg  40 mg Oral Daily [name], NP   40 mg at 02/14/23 0850   polyethylene glycol (MIRALAX / GLYCOLAX) packet 17 g  17 g Oral Daily PRN [name], NP   17 g at 02/13/23 1731   sertraline (ZOLOFT) tablet 150 mg  150 mg Oral Daily Massengill, Harrold Donath, MD    150 mg at 02/14/23 0849   traZODone (DESYREL) tablet 50 mg  50 mg Oral QHS [name], NP   50 mg at 02/13/23 2101   Lab Results:  No results found for this or any previous visit (from the past 48 hour(s)).  Blood Alcohol level:  Lab Results  Component Value Date   ETH <10 12/19/2022   ETH <10 11/21/2019   Metabolic Disorder Labs: Lab Results  Component Value Date   HGBA1C 4.4 (L) 12/21/2022   MPG 79.58 12/21/2022   No results found for: "PROLACTIN" Lab Results  Component Value Date   CHOL 218 (H) 12/21/2022   TRIG 81 12/21/2022   HDL 53 12/21/2022   CHOLHDL 4.1 12/21/2022   VLDL 16 12/21/2022   LDLCALC 149 (H) 12/21/2022   LDLCALC 110 (H) 03/13/2011   Physical Findings: AIMS: Facial and Oral Movements Muscles of Facial Expression: None, normal Lips and Perioral Area: None, normal Jaw: None, normal Tongue: None, normal,Extremity Movements Upper (arms, wrists, hands, fingers): None, normal Lower (legs, knees, ankles, toes): None, normal, Trunk Movements Neck, shoulders, hips: None, normal, Overall Severity Severity of abnormal movements (highest score from questions above): None, normal Incapacitation due to abnormal movements: None, normal Patient's awareness of abnormal movements (rate only patient's report): No Awareness, Dental Status Current problems with teeth and/or dentures?: No Does patient usually wear dentures?: No   Musculoskeletal: Strength & Muscle Tone: within normal limits Gait & Station: normal Patient leans: N/A  Psychiatric Specialty Exam:  Presentation  General Appearance:  Lying down in bed, better hygiene noted  Eye Contact: Fair Speech: Garbled  Speech Volume: Decreased  Handedness: Right  Mood and Affect  Mood: Depressed (worried)  Affect: Congruent  Thought Process  Thought Processes: Coherent; Goal Directed; Linear  Descriptions of Associations:Intact  Orientation:Full (Time, Place and Person)  Thought  Content: Denies SI HI or AVH does not present responding to stimuli. Presentation consistent with moderate intellectual disability  History of Schizophrenia/Schizoaffective disorder:Yes  Duration  of Psychotic Symptoms:Greater than six months  Hallucinations:Hallucinations: None Description of Auditory Hallucinations: Denies any hallucinations today.   Ideas of Reference:None  Suicidal Thoughts:Suicidal Thoughts: No SI Active Intent and/or Plan: Without Intent; Without Plan; Without Means to Carry Out; Without Access to Means SI Passive Intent and/or Plan: Without Intent; Without Plan; Without Means to Carry Out; Without Access to Means   Homicidal Thoughts:Homicidal Thoughts: No   Sensorium  Memory: Immediate Fair; Recent Fair; Remote Poor  Judgment: Limited Insight: Limited Executive Functions  Concentration: Good  Attention Span: Good  Recall: Fair  Fund of Knowledge: Poor  Language: Fair  Psychomotor Activity  Psychomotor Activity: Psychomotor Activity: Decreased   Assets  Assets: Communication Skills; Desire for Improvement; Financial Resources/Insurance; Physical Health; Resilience; Social Support  Sleep  Sleep: Sleep: Good Number of Hours of Sleep: 7.5   Physical Exam: Physical Exam Vitals and nursing note reviewed.  Constitutional:      General: She is not in acute distress.    Appearance: She is obese. She is not ill-appearing or diaphoretic.  HENT:     Head: Normocephalic.     Comments: Intellectual disability    Nose: No congestion.     Mouth/Throat:     Mouth: Mucous membranes are moist.     Pharynx: Oropharynx is clear.  Eyes:     Pupils: Pupils are equal, round, and reactive to light.  Cardiovascular:     Rate and Rhythm: Normal rate.     Pulses: Normal pulses.  Pulmonary:     Effort: Pulmonary effort is normal. No respiratory distress.  Abdominal:     Comments: Deferred  Genitourinary:    Comments:  Deferred Musculoskeletal:        General: Normal range of motion.     Cervical back: Normal range of motion.  Skin:    General: Skin is warm.  Neurological:     General: No focal deficit present.     Mental Status: She is alert.  Psychiatric:        Behavior: Behavior normal.    Blood pressure 105/71, pulse (!) 57, temperature 98.2 F (36.8 C), temperature source Oral, resp. rate 19, height 4\' 11"  (1.499 m), weight 63 kg, SpO2 98%. Body mass index is 28.05 kg/m.  Treatment Plan Summary: Daily contact with patient to assess and evaluate symptoms and progress in treatment and Medication management.   Still waiting on safe disposition as patient would not be able to live independently given cognitive impairment. Recommend assisted living.   No medication side effects.  Because of urinary incontinence, recommended adult diapers to be worn.  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  Plan:  -Continue Vitamin D 50.000 units weekly for bone health. -Continue Sertraline150 mg po Q daily for depression/anxiety -Continue Buspar 15 mg po bid for anxiety.  -Continue paliperidone 6 mg po qd for mood stabilization -Continue Trazodone 50 mg nightly for insomnia  -Continue Nicoderm 21 mg topically Q 24 hrs for nicotine withdrawal management. -Continue vitamin B12 1000 mcg IM weekly for 4 weeks then monthly -Continue Melatonin 5 mg nightly for sleep -Continue Protonix EC 40 mg p.o. daily for GERD -Continue MiraLAX 17 g p.o. PRN for constipation -Continue hydroxyzine 25 mg p.o. 3 times daily as needed for anxiety -Continue Ensure nutritional shakes TID in between  meals  -Previously discontinued Hydroxyzine 50 mg nightly-hypotension  in the mornings  Safety and Monitoring: Voluntary admission to inpatient psychiatric unit for safety, stabilization and treatment Daily contact with patient to assess and evaluate symptoms and progress in treatment Patient's case to be discussed in multi-disciplinary team meeting Observation Level : q15 minute checks Vital signs: q12 hours Precautions: Safety   Discharge Planning: Social work and case management to assist with discharge planning and identification of hospital follow-up needs prior to discharge Estimated LOS: Unknown at this time. Discharge Concerns: Need to establish a safety plan; Medication compliance and effectiveness Discharge Goals: Return home with outpatient referrals for mental health follow-up including medication management/psychotherapy No legal guardian, has payee  Armandina Stammer, NP.Marland Kitchen [id number]. 02/14/2023, 3:08 PM Patient ID: Yolanda [name], female   DOB: [birth year], 47 y.o.    Patient ID: Yolanda [name], female   DOB: April 27, 1976, 47 y.o.   MRN: [id number] Patient ID: Yolanda [name], female   DOB: 1975/10/08, 47 y.o.   MRN: [id number] Patient ID: Yolanda [name], female   DOB: 11-Mar-1976, 47 y.o.   MRN: [id number]

## 2023-02-14 NOTE — Progress Notes (Signed)
I assumed care for Yolanda [name] at about 07:30.She was resting in her bed, appeared to be tired but responded when called. She remains disheveled at baseline, no falls, she did not attend any groups, vaguely denied any avh/hi/si before drifting back to sleep. She has been med compliant with encouragement. She is being monitored as ordered.

## 2023-02-14 NOTE — [facility] Group Notes (Signed)
Adult Psychoeducational Group Note  Date:  02/14/2023 Time:  9:46 PM  Group Topic/Focus:  Wrap-Up Group:   The focus of this group is to help patients review their daily goal of treatment and discuss progress on daily workbooks.  Participation Level:  Did Not Attend  Christ Kick 02/14/2023, 9:46 PM

## 2023-02-15 DIAGNOSIS — F251 Schizoaffective disorder, depressive type: Secondary | ICD-10-CM | POA: Diagnosis not present

## 2023-02-15 NOTE — Progress Notes (Signed)
Patient ID: Yolanda [name], female   DOB: 01/03/1976, 47 y.o.   MRN: [id number] [facility] MD Progress Note  02/15/2023 5:55 PM Yolanda [name]  MRN:  [id number]  Reason for admission: This is the first psychiatric admission in this [facility] in 12 years for this 68 AA female with an extensive hx of mental illnesses & probable polysubstance use disorders. She is admitted to the [facility] from the [facility] hospital with complain of worsening suicidal ideations with plan to stab herself. Per chart review, patient apparently reported at the ED that she has been depressed for a while & has not been taking her mental health medications. After medical evaluation.clearance, she was transferred to the [facility] for further psychiatric evaluation/treatments.   APS report on 12/31/22 Has payee, no legal guardian Continues to await for placement.  24 hr chart review: V/S WNL. Pt remains compliant with medications, isolative to the room & continuing to require positive verbal reinforcements to get out of her room. no behavioral issues reported overnight.   Daily notes: Yolanda [name] is seen, chart reviewed. The chart findings discussed with the treatment team. She presents alert.  "I'm doing okay today, I just need my depression medicine increased. I'm still waiting to hear if a place has been found for me so I can get discharged from here. Do you know if the SW & the DSS found anything yet?". Patient is informed that the social worker & the DSS have not been able to find a place yet. Patient is encouraged to be patient. Patient currently denies any SIHI, AVH, delusional thoughts or paranoia. She does not appear to be responding to any internal stimuli. At the present time, patient have reached her baseline ;level of function, mentally. However, because of her intellectual limitations, the DSS is currently/actively trying to find this patient a suitable place to reside in after discharge. There are no changes made on the patient's plan of  care. Will continue as already in progress. Vital signs remain stable.  Pt has continued to verbalize her frutration with continuing to remain hospitalized. We have educated her that we are awaiting DSS to coordinate with CSW to place her in a safe living arrangement outside of the hospital environment, but she is seeming not to understanding this. She verbalizes feelings of abandonment by her family. She reports a fair sleep quality last night, staff reports that her sleep quality was good. She reports a good appetite, denies being in any physical pain, states that she is moving her bowels well.  We're continuing medications as listed below. No TD/EPS type symptoms found on assessment, and pt denies any feelings of stiffness. AIMS: 0.   Principal Problem: Schizoaffective disorder, depressive type (HCC)   Diagnosis: Principal Problem:   Schizoaffective disorder, depressive type (HCC) Active Problems:   GERD (gastroesophageal reflux disease)   Intellectual disability   Tobacco use disorder  Past Psychiatric History: Patient is not forth coming with the answers to the assessment questions. She is refusing to provide information about her mental health hx or symptoms.   Past Medical History:  Past Medical History:  Diagnosis Date   Bipolar affect, depressed (HCC)    Constipation 08/17/2022   Depression    Falls 07/21/2022   Fracture of femoral neck, right, closed (HCC) 01/17/2022   Herpes simplex 08/22/2017   Open fracture dislocation of right elbow joint 01/17/2022    Past Surgical History:  Procedure Laterality Date   NO PAST SURGERIES     SALPINGECTOMY  Family History: History reviewed. No pertinent family history.  Family Psychiatric  History: See H&P.  Social History:  Social History   Substance and Sexual Activity  Alcohol Use Yes     Social History   Substance and Sexual Activity  Drug Use Yes   Types: Cocaine, Marijuana    Social History   Socioeconomic  History   Marital status: Single    Spouse name: Not on file   Number of children: Not on file   Years of education: Not on file   Highest education level: Not on file  Occupational History   Not on file  Tobacco Use   Smoking status: Every Day   Smokeless tobacco: Not on file  Substance and Sexual Activity   Alcohol use: Yes   Drug use: Yes    Types: Cocaine, Marijuana   Sexual activity: Yes  Other Topics Concern   Not on file  Social History Narrative   Not on file   Social Determinants of Health   Financial Resource Strain: Low Risk  (09/12/2022)   Received from [facility], [facility]   Overall Financial Resource Strain (CARDIA)    Difficulty of Paying Living Expenses: Not hard at all  Food Insecurity: Patient Declined (12/20/2022)   Hunger Vital Sign    Worried About Running Out of Food in the Last Year: Patient declined    Ran Out of Food in the Last Year: Patient declined  Transportation Needs: No Transportation Needs (12/20/2022)   PRAPARE - Administrator, Civil Service (Medical): No    Lack of Transportation (Non-Medical): No  Physical Activity: Not on file  Stress: No Stress Concern Present (07/17/2022)   Received from [facility], [facility] of Occupational Health - Occupational Stress Questionnaire    Feeling of Stress : Not at all  Social Connections: Unknown (07/16/2022)   Received from [facility], [facility]   Social Network    Social Network: Not on file   Current Medications: Current Facility-Administered Medications  Medication Dose Route Frequency Provider Last Rate Last Admin   acetaminophen (TYLENOL) tablet 650 mg  650 mg Oral Q6H PRN [name], NP   650 mg at 02/11/23 1727   alum & mag hydroxide-simeth (MAALOX/MYLANTA) 200-200-20 MG/5ML suspension 30 mL  30 mL Oral Q4H PRN [name], NP   30 mL at 01/28/23 1530   busPIRone (BUSPAR) tablet 15 mg  15 mg Oral BID [name], NP   15 mg at 02/15/23  1707   cyanocobalamin (VITAMIN B12) injection 1,000 mcg  1,000 mcg Intramuscular Q30 days [name], Nadir, MD   1,000 mcg at 02/02/23 1238   diphenhydrAMINE (BENADRYL) capsule 50 mg  50 mg Oral TID PRN [name], NP   50 mg at 01/15/23 1211   Or   diphenhydrAMINE (BENADRYL) injection 50 mg  50 mg Intramuscular TID PRN [name], NP       feeding supplement (ENSURE ENLIVE / ENSURE PLUS) liquid 237 mL  237 mL Oral TID BM Nkwenti, Doris, NP   237 mL at 02/15/23 1456   haloperidol (HALDOL) tablet 5 mg  5 mg Oral TID PRN [name], NP   5 mg at 01/15/23 1211   Or   haloperidol lactate (HALDOL) injection 5 mg  5 mg Intramuscular TID PRN [name], NP       hydrOXYzine (ATARAX) tablet 25 mg  25 mg Oral TID PRN [name], DO  25 mg at 02/14/23 2116   LORazepam (ATIVAN) tablet 2 mg  2 mg Oral TID PRN [name], NP   2 mg at 01/15/23 1211   Or   LORazepam (ATIVAN) injection 2 mg  2 mg Intramuscular TID PRN [name], NP       magnesium hydroxide (MILK OF MAGNESIA) suspension 30 mL  30 mL Oral Daily PRN [name], NP   30 mL at 02/15/23 1318   melatonin tablet 5 mg  5 mg Oral QHS Nkwenti, Doris, NP   5 mg at 02/14/23 2116   nicotine (NICODERM CQ - dosed in mg/24 hours) patch 21 mg  21 mg Transdermal Daily [name], DO   21 mg at 02/15/23 0851   paliperidone (INVEGA) 24 hr tablet 6 mg  6 mg Oral Daily [name], NP   6 mg at 02/14/23 2116   pantoprazole (PROTONIX) EC tablet 40 mg  40 mg Oral Daily [name], NP   40 mg at 02/15/23 0851   polyethylene glycol (MIRALAX / GLYCOLAX) packet 17 g  17 g Oral Daily PRN [name], NP   17 g at 02/13/23 1731   sertraline (ZOLOFT) tablet 150 mg  150 mg Oral Daily Massengill, Harrold Donath, MD   150 mg at 02/15/23 0851   traZODone (DESYREL) tablet 50 mg  50 mg Oral QHS [name], NP   50 mg at 02/14/23 2116   Lab Results:  No results found for this or any previous visit (from the past 48 hour(s)).  Blood Alcohol  level:  Lab Results  Component Value Date   ETH <10 12/19/2022   ETH <10 11/21/2019   Metabolic Disorder Labs: Lab Results  Component Value Date   HGBA1C 4.4 (L) 12/21/2022   MPG 79.58 12/21/2022   No results found for: "PROLACTIN" Lab Results  Component Value Date   CHOL 218 (H) 12/21/2022   TRIG 81 12/21/2022   HDL 53 12/21/2022   CHOLHDL 4.1 12/21/2022   VLDL 16 12/21/2022   LDLCALC 149 (H) 12/21/2022   LDLCALC 110 (H) 03/13/2011   Physical Findings: AIMS: Facial and Oral Movements Muscles of Facial Expression: None, normal Lips and Perioral Area: None, normal Jaw: None, normal Tongue: None, normal,Extremity Movements Upper (arms, wrists, hands, fingers): None, normal Lower (legs, knees, ankles, toes): None, normal, Trunk Movements Neck, shoulders, hips: None, normal, Overall Severity Severity of abnormal movements (highest score from questions above): None, normal Incapacitation due to abnormal movements: None, normal Patient's awareness of abnormal movements (rate only patient's report): No Awareness, Dental Status Current problems with teeth and/or dentures?: No Does patient usually wear dentures?: No   Musculoskeletal: Strength & Muscle Tone: within normal limits Gait & Station: normal Patient leans: N/A  Psychiatric Specialty Exam:  Presentation  General Appearance:  Lying down in bed, better hygiene noted  Eye Contact: Fair Speech: Garbled  Speech Volume: Decreased  Handedness: Right  Mood and Affect  Mood: Depressed (worried)  Affect: Congruent  Thought Process  Thought Processes: Coherent; Goal Directed; Linear  Descriptions of Associations:Intact  Orientation:Full (Time, Place and Person)  Thought Content: Denies SI HI or AVH does not present responding to stimuli. Presentation consistent with moderate intellectual disability  History of Schizophrenia/Schizoaffective disorder:Yes  Duration of Psychotic Symptoms:Greater than  six months  Hallucinations:Hallucinations: None Description of Auditory Hallucinations: Denies any hallucinations today.   Ideas of Reference:None  Suicidal Thoughts:Suicidal Thoughts: No SI Active Intent and/or Plan: Without Intent; Without Plan; Without Means to [employer];  Without Access to Means SI Passive Intent and/or Plan: Without Intent; Without Plan; Without Means to Carry Out; Without Access to Means   Homicidal Thoughts:Homicidal Thoughts: No   Sensorium  Memory: Immediate Fair; Recent Fair; Remote Poor  Judgment: Limited Insight: Limited Executive Functions  Concentration: Good  Attention Span: Good  Recall: Fair  Fund of Knowledge: Poor  Language: Fair  Psychomotor Activity  Psychomotor Activity: Psychomotor Activity: Decreased   Assets  Assets: Communication Skills; Desire for Improvement; Financial Resources/Insurance; Physical Health; Resilience; Social Support  Sleep  Sleep: Sleep: Good Number of Hours of Sleep: 7.5   Physical Exam: Physical Exam Vitals and nursing note reviewed.  Constitutional:      General: She is not in acute distress.    Appearance: She is obese. She is not ill-appearing or diaphoretic.  HENT:     Head: Normocephalic.     Comments: Intellectual disability    Nose: No congestion.     Mouth/Throat:     Mouth: Mucous membranes are moist.     Pharynx: Oropharynx is clear.  Eyes:     Pupils: Pupils are equal, round, and reactive to light.  Cardiovascular:     Rate and Rhythm: Normal rate.     Pulses: Normal pulses.  Pulmonary:     Effort: Pulmonary effort is normal. No respiratory distress.  Abdominal:     Comments: Deferred  Genitourinary:    Comments: Deferred Musculoskeletal:        General: Normal range of motion.     Cervical back: Normal range of motion.  Skin:    General: Skin is warm.  Neurological:     General: No focal deficit present.     Mental Status: She is alert.  Psychiatric:         Behavior: Behavior normal.    Blood pressure 96/80, pulse 96, temperature 98.2 F (36.8 C), temperature source Oral, resp. rate 19, height 4\' 11"  (1.499 m), weight 63 kg, SpO2 100%. Body mass index is 28.05 kg/m.  Treatment Plan Summary: Daily contact with patient to assess and evaluate symptoms and progress in treatment and Medication management.   Still waiting on safe disposition as patient would not be able to live independently given cognitive impairment. Recommend assisted living.   No medication side effects.  Because of urinary incontinence, recommended adult diapers to be worn.  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  Plan:  -Continue Vitamin D 50.000 units weekly for bone health. -Continue Sertraline150 mg po Q daily for depression/anxiety -Continue Buspar 15 mg po bid for anxiety.  -Continue paliperidone 6 mg po qd for mood stabilization -Continue Trazodone 50 mg nightly for insomnia  -Continue Nicoderm 21 mg topically Q 24 hrs for nicotine withdrawal management. -Continue vitamin B12 1000 mcg IM weekly for 4 weeks then monthly -Continue Melatonin 5 mg nightly for sleep -Continue Protonix EC 40 mg p.o. daily for GERD -Continue MiraLAX 17 g p.o. PRN for constipation -Continue hydroxyzine 25 mg p.o. 3 times daily as needed for anxiety -Continue Ensure nutritional shakes TID in between meals  -Previously discontinued Hydroxyzine 50 mg nightly-hypotension in the mornings  Safety and Monitoring: Voluntary admission to inpatient psychiatric unit for safety, stabilization and treatment Daily contact with patient to assess and evaluate symptoms and progress in treatment Patient's case to  be discussed in  multi-disciplinary team meeting Observation Level : q15 minute checks Vital signs: q12 hours Precautions: Safety   Discharge Planning: Social work and case management to assist with discharge planning and identification of hospital follow-up needs prior to discharge Estimated LOS: Unknown at this time. Discharge Concerns: Need to establish a safety plan; Medication compliance and effectiveness Discharge Goals: Return home with outpatient referrals for mental health follow-up including medication management/psychotherapy No legal guardian, has payee  Armandina Stammer, NP.Marland Kitchen Pmhnp-bc. 02/15/2023, 5:55 PM Patient ID: Larey Brick, female   DOB: 1976/03/04, 47 y.o.    Patient ID: NEMIAH LEDEZMA, female   DOB: [birth year], 47 y.o.   MRN: [id number] Patient ID: DOXIE PETRINI, female   DOB: [birth year], 47 y.o.   MRN: [id number] Patient ID: LECIE LANGELIER, female   DOB: 1975/08/23, 47 y.o.   MRN: [id number] Patient ID: HAI NARDINI, female   DOB: 01/28/1976, 47 y.o.   MRN: [id number]

## 2023-02-15 NOTE — Progress Notes (Signed)
D:  Patient's self inventory sheet, patient sleeps good, no sleep medication.  Good appetite, normal energy level, good concentration.  Rated depression 9, hopeless 10, anxiety 8.  Denied withdrawals.  Denied SI.  Denied physical problems.  Denied physical pain.  Goal is stay out of my room.  I need to get out of bed.  Does have discharge plans. A:  Medications administered per MD orders.  Emotional support and encouragement given patient. R:  Denied SI and HI, contracts for safety.  Denied A/V hallucinations.  Safety maintained with 15 minute checks.  Patient helped to change sheets on her bed today.

## 2023-02-15 NOTE — Plan of Care (Signed)
  Problem: Education: Goal: Knowledge of the prescribed therapeutic regimen will improve Outcome: Progressing   Problem: Activity: Goal: Imbalance in normal sleep/wake cycle will improve Outcome: Progressing   Problem: Coping: Goal: Coping ability will improve Outcome: Progressing Goal: Will verbalize feelings Outcome: Progressing   Problem: Safety: Goal: Ability to disclose and discuss suicidal ideas will improve Outcome: Progressing

## 2023-02-15 NOTE — Progress Notes (Signed)
   02/15/23 0556  15 Minute Checks  Location Bedroom  Visual Appearance Calm  Behavior Sleeping  Sleep (Behavioral Health Patients Only)  Calculate sleep? (Click Yes once per 24 hr at 0600 safety check) Yes  Documented sleep last 24 hours 6.75

## 2023-02-15 NOTE — Group Note (Signed)
Date:  02/15/2023 Time:  9:27 PM  Group Topic/Focus:  Wrap-Up Group:   The focus of this group is to help patients review their daily goal of treatment and discuss progress on daily workbooks.    Participation Level:  Active  Participation Quality:  Appropriate and Sharing  Affect:  Appropriate  Cognitive:  Appropriate  Insight: Appropriate  Engagement in Group:  Engaged and Improving  Modes of Intervention:  Discussion and Socialization  Additional Comments:  The patient stated that the goal for today was to stay out of room and interact more. The patient stated that she did achieve that goal and that is the same goal for tomorrow. The patient rated their day 8/10. The patient did state that she spoke with mother and children today. The patient did stated that she wanted to speak with doctor about medication.   Kennieth Francois 02/15/2023, 9:27 PM

## 2023-02-15 NOTE — Group Note (Signed)
Recreation Therapy Group Note   Group Topic:Problem Solving  Group Date: 02/15/2023 Start Time: 0931 End Time: 1001 Facilitators: Jameison Haji-McCall, LRT,CTRS Location: [address] Dayroom   Goal Area(s) Addresses:  Patient will effectively work with peer towards shared goal.  Patient will identify skills used to make activity successful.  Patient will identify how skills used during activity can be applied to reach post d/c goals.   Group Description: [employer]. In teams of 5-6, patients were given 11 craft pipe cleaners. Using the materials provided, patients were instructed to compete again the opposing team(s) to build the tallest free-standing structure from floor level. The activity was timed; difficulty increased by Clinical research associate as Production designer, theatre/television/film continued.  Systematically resources were removed with additional directions for example, placing one arm behind their back, working in silence, and shape stipulations. LRT facilitated post-activity discussion reviewing team processes and necessary communication skills involved in completion. Patients were encouraged to reflect how the skills utilized, or not utilized, in this activity can be incorporated to positively impact support systems post discharge.   Affect/Mood: N/A   Participation Level: Did not attend    Clinical Observations/Individualized Feedback:     Plan: Continue to engage patient in RT group sessions 2-3x/week.   Alcides Nutting-McCall, LRT,CTRS 02/15/2023 12:17 PM

## 2023-02-15 NOTE — Progress Notes (Signed)
   02/15/23 2030  Psych Admission Type (Psych Patients Only)  Admission Status Involuntary  Psychosocial Assessment  Patient Complaints Sadness;Anxiety;Depression  Eye Contact Fair  Facial Expression Anxious;Sad  Affect Sad;Depressed  Speech Soft  Interaction Minimal  Motor Activity Slow  Appearance/Hygiene Unremarkable;Improved  Behavior Characteristics Cooperative;Anxious  Mood Anxious;Sad;Pleasant  Thought Process  Coherency WDL  Content WDL  Delusions None reported or observed  Perception WDL  Hallucination None reported or observed  Judgment Limited  Confusion None  Danger to Self  Current suicidal ideation? Denies  Agreement Not to Harm Self Yes  Description of Agreement verbal  Danger to Others  Danger to Others None reported or observed   Progress note   D: Pt seen at nurse's station. Pt denies SI, HI, AVH. Pt rates pain  0/10. Pt rates anxiety  little bit/10 and depression  little bit/10. Pt went to groups today. Per day shift nursing, pt took a shower and changed her linen today. Pt is in better spirits. Says that a good thing about her day is that she spoke to her youngest daughter by phone. Pt spoke fondly about her grandson, who is 74 years old. Still frustrated about being in hospital so long. Reiterated to pt that the goal is to get her safe housing where she can continue to manage her mental health. Pt admits that the shelter she was in was not safe. No other concerns noted at this time.  A: Pt provided support and encouragement. Pt given scheduled medication as prescribed. PRNs as appropriate. Q15 min checks for safety.   R: Pt safe on the unit. Will continue to monitor.

## 2023-02-15 NOTE — Plan of Care (Signed)
Nurse discussed anxiety, depression and coping skills with patient.  

## 2023-02-16 DIAGNOSIS — F251 Schizoaffective disorder, depressive type: Secondary | ICD-10-CM | POA: Diagnosis not present

## 2023-02-16 NOTE — Group Note (Signed)
Date:  02/16/2023 Time:  9:10 PM  Group Topic/Focus:  Wrap-Up Group:   The focus of this group is to help patients review their daily goal of treatment and discuss progress on daily workbooks.    Participation Level:  Minimal  Participation Quality:  Appropriate and Sharing  Affect:  Appropriate and Flat  Cognitive:  Appropriate  Insight: Appropriate and Lacking  Engagement in Group:  Engaged and Lacking  Modes of Intervention:  Discussion and Socialization  Additional Comments:  The patient stated that today was an so/so day. The patient was very limited. The patient did not rate day. The patient stated that she did attend lunch and groups throughout the day and the patient did not have a goal for today or tomorrow.   Yolanda [name] 02/16/2023, 9:10 PM

## 2023-02-16 NOTE — Plan of Care (Signed)
  Problem: Education: Goal: Knowledge of the prescribed therapeutic regimen will improve Outcome: Progressing   Problem: Activity: Goal: Interest or engagement in leisure activities will improve Outcome: Progressing Goal: Imbalance in normal sleep/wake cycle will improve Outcome: Progressing   Problem: Coping: Goal: Will verbalize feelings Outcome: Progressing   Problem: Health Behavior/Discharge Planning: Goal: Ability to make decisions will improve Outcome: Progressing Goal: Compliance with therapeutic regimen will improve Outcome: Progressing   Problem: Self-Concept: Goal: Will verbalize positive feelings about self Outcome: Progressing Goal: Level of anxiety will decrease Outcome: Progressing

## 2023-02-16 NOTE — Group Note (Signed)
Date:  02/16/2023 Time:  11:11 AM  Group Topic/Focus:  Goals Group:   The focus of this group is to help patients establish daily goals to achieve during treatment and discuss how the patient can incorporate goal setting into their daily lives to aide in recovery. Orientation:   The focus of this group is to educate the patient on the purpose and policies of crisis stabilization and provide a format to answer questions about their admission.  The group details unit policies and expectations of patients while admitted.    Participation Level:  Did Not Attend  Participation Quality:   n/a  Affect:   n/a  Cognitive:   n/a  Insight: None  Engagement in Group:   n/a  Modes of Intervention:   n/a  Additional Comments:   Pt did not attend.  Yolanda [name] 02/16/2023, 11:11 AM

## 2023-02-16 NOTE — [facility] Group Notes (Signed)
LCSW Wellness Group Note   02/16/2023 1:00pm  Type of Group and Topic: Psychoeducational Group:  Wellness  Participation Level:  minimal  Description of Group  Wellness group introduces the topic and its focus on developing healthy habits across the spectrum and its relationship to a decrease in hospital admissions.  Six areas of wellness are discussed: physical, social spiritual, intellectual, occupational, and emotional.  Patients are asked to consider their current wellness habits and to identify areas of wellness where they are interested and able to focus on improvements.    Therapeutic Goals Patients will understand components of wellness and how they can positively impact overall health.  Patients will identify areas of wellness where they have developed good habits. Patients will identify areas of wellness where they would like to make improvements.    Summary of Patient Progress: pt sat mostly with her head down during group discussion.  She did respond when called on by CSW and seemed to have been paying attention.  Pt identified spiritual and financial as wellness areas of strength.  She identified emotional as a wellness area that needs work.       Therapeutic Modalities: Cognitive Behavioral Therapy Psychoeducation    Lorri Frederick, LCSW

## 2023-02-16 NOTE — Progress Notes (Signed)
   02/16/23 0600  15 Minute Checks  Location Bedroom  Visual Appearance Calm  Behavior Composed  Sleep (Behavioral Health Patients Only)  Calculate sleep? (Click Yes once per 24 hr at 0600 safety check) Yes  Documented sleep last 24 hours 7.75

## 2023-02-16 NOTE — Progress Notes (Signed)
Patient ID: Yolanda [name], female   DOB: August 10, 1975, 47 y.o.   MRN: [id number] [facility] MD Progress Note  02/16/2023 4:05 PM Yolanda [name]  MRN:  [id number]  Reason for admission: This is the first psychiatric admission in this [facility] in 12 years for this 29 AA female with an extensive hx of mental illnesses & probable polysubstance use disorders. She is admitted to the [facility] from the [facility] hospital with complain of worsening suicidal ideations with plan to stab herself. Per chart review, patient apparently reported at the ED that she has been depressed for a while & has not been taking her mental health medications. After medical evaluation.clearance, she was transferred to the [facility] for further psychiatric evaluation/treatments.   APS report on 12/31/22 Has payee, no legal guardian Continues to await for placement.  24 hr chart review: V/S WNL. Pt remains compliant with medications, isolative to the room & continuing to require positive verbal reinforcements to get out of her room. no behavioral issues reported overnight.   Daily notes: Moe is seen today. She remains without any significant changes. She is taking her medications as recommended. She is out of her room to the dayroom on daily basis, attending group sessions. However, she does need some encouragement sometimes to come out of her room/attend group sessions. She reports today, "I'm okay, I just want to go home. I slept okay last night". Patient is informed that the DSS continues to search for a place for her to reside after discharge. And as of today, there are no new news that a place has been secured for her. She is instructed & encouraged to continue to exercise some patience. She currently denies any SIHI, AVH, delusional thoughts or paranoia. She does not appear to be responding to any internal stimuli. There are no changes made on her current plan of care. Continue as already in progress. Vital signs remain stable.    Pt has  continued to verbalize her frutration with continuing to remain hospitalized. We have educated her that we are awaiting DSS to coordinate with CSW to place her in a safe living arrangement outside of the hospital environment, but she is seeming not to understanding this. She verbalizes feelings of abandonment by her family. She reports a fair sleep quality last night, staff reports that her sleep quality was good. She reports a good appetite, denies being in any physical pain, states that she is moving her bowels well.  We're continuing medications as listed below. No TD/EPS type symptoms found on assessment, and pt denies any feelings of stiffness. AIMS: 0.   Principal Problem: Schizoaffective disorder, depressive type (HCC)   Diagnosis: Principal Problem:   Schizoaffective disorder, depressive type (HCC) Active Problems:   GERD (gastroesophageal reflux disease)   Intellectual disability   Tobacco use disorder  Past Psychiatric History: Patient is not forth coming with the answers to the assessment questions. She is refusing to provide information about her mental health hx or symptoms.   Past Medical History:  Past Medical History:  Diagnosis Date   Bipolar affect, depressed (HCC)    Constipation 08/17/2022   Depression    Falls 07/21/2022   Fracture of femoral neck, right, closed (HCC) 01/17/2022   Herpes simplex 08/22/2017   Open fracture dislocation of right elbow joint 01/17/2022    Past Surgical History:  Procedure Laterality Date   NO PAST SURGERIES     SALPINGECTOMY     Family History: History reviewed. No pertinent family history.  Family Psychiatric  History: See H&P.  Social History:  Social History   Substance and Sexual Activity  Alcohol Use Yes     Social History   Substance and Sexual Activity  Drug Use Yes   Types: Cocaine, Marijuana    Social History   Socioeconomic History   Marital status: Single    Spouse name: Not on file   Number of children:  Not on file   Years of education: Not on file   Highest education level: Not on file  Occupational History   Not on file  Tobacco Use   Smoking status: Every Day   Smokeless tobacco: Not on file  Substance and Sexual Activity   Alcohol use: Yes   Drug use: Yes    Types: Cocaine, Marijuana   Sexual activity: Yes  Other Topics Concern   Not on file  Social History Narrative   Not on file   Social Determinants of Health   Financial Resource Strain: Low Risk  (09/12/2022)   Received from [facility]   Overall Financial Resource Strain (CARDIA)    Difficulty of Paying Living Expenses: Not hard at all  Food Insecurity: Patient Declined (12/20/2022)   Hunger Vital Sign    Worried About Running Out of Food in the Last Year: Patient declined    Ran Out of Food in the Last Year: Patient declined  Transportation Needs: No Transportation Needs (12/20/2022)   PRAPARE - Administrator, Civil Service (Medical): No    Lack of Transportation (Non-Medical): No  Physical Activity: Not on file  Stress: No Stress Concern Present (07/17/2022)   Received from [facility], [facility] of Occupational Health - Occupational Stress Questionnaire    Feeling of Stress : Not at all  Social Connections: Unknown (07/16/2022)   Received from [facility]   Social Network    Social Network: Not on file   Current Medications: Current Facility-Administered Medications  Medication Dose Route Frequency Provider Last Rate Last Admin   acetaminophen (TYLENOL) tablet 650 mg  650 mg Oral Q6H PRN [name], NP   650 mg at 02/11/23 1727   alum & mag hydroxide-simeth (MAALOX/MYLANTA) 200-200-20 MG/5ML suspension 30 mL  30 mL Oral Q4H PRN [name], NP   30 mL at 01/28/23 1530   busPIRone (BUSPAR) tablet 15 mg  15 mg Oral BID [name], NP   15 mg at 02/16/23 2376   cyanocobalamin (VITAMIN B12) injection 1,000 mcg  1,000 mcg Intramuscular Q30  days Sarita Bottom, MD   1,000 mcg at 02/02/23 1238   diphenhydrAMINE (BENADRYL) capsule 50 mg  50 mg Oral TID PRN [name], NP   50 mg at 01/15/23 1211   Or   diphenhydrAMINE (BENADRYL) injection 50 mg  50 mg Intramuscular TID PRN [name], NP       feeding supplement (ENSURE ENLIVE / ENSURE PLUS) liquid 237 mL  237 mL Oral TID BM Nkwenti, Doris, NP   237 mL at 02/16/23 1339   haloperidol (HALDOL) tablet 5 mg  5 mg Oral TID PRN [name], NP   5 mg at 01/15/23 1211   Or   haloperidol lactate (HALDOL) injection 5 mg  5 mg Intramuscular TID PRN [name], NP       hydrOXYzine (ATARAX) tablet 25 mg  25 mg Oral TID PRN [name], DO   25 mg at 02/15/23 2122   LORazepam (ATIVAN)  tablet 2 mg  2 mg Oral TID PRN [name], NP   2 mg at 01/15/23 1211   Or   LORazepam (ATIVAN) injection 2 mg  2 mg Intramuscular TID PRN [name], NP       magnesium hydroxide (MILK OF MAGNESIA) suspension 30 mL  30 mL Oral Daily PRN [name], NP   30 mL at 02/15/23 1318   melatonin tablet 5 mg  5 mg Oral QHS Nkwenti, Doris, NP   5 mg at 02/15/23 2122   nicotine (NICODERM CQ - dosed in mg/24 hours) patch 21 mg  21 mg Transdermal Daily [name], DO   21 mg at 02/16/23 0940   paliperidone (INVEGA) 24 hr tablet 6 mg  6 mg Oral Daily [name], NP   6 mg at 02/15/23 2122   pantoprazole (PROTONIX) EC tablet 40 mg  40 mg Oral Daily [name], NP   40 mg at 02/16/23 5621   polyethylene glycol (MIRALAX / GLYCOLAX) packet 17 g  17 g Oral Daily PRN [name], NP   17 g at 02/13/23 1731   sertraline (ZOLOFT) tablet 150 mg  150 mg Oral Daily Massengill, Harrold Donath, MD   150 mg at 02/16/23 3086   traZODone (DESYREL) tablet 50 mg  50 mg Oral QHS [name], NP   50 mg at 02/15/23 2122   Lab Results:  No results found for this or any previous visit (from the past 48 hour(s)).  Blood Alcohol level:  Lab Results  Component Value Date   ETH <10 12/19/2022   ETH <10 11/21/2019    Metabolic Disorder Labs: Lab Results  Component Value Date   HGBA1C 4.4 (L) 12/21/2022   MPG 79.58 12/21/2022   No results found for: "PROLACTIN" Lab Results  Component Value Date   CHOL 218 (H) 12/21/2022   TRIG 81 12/21/2022   HDL 53 12/21/2022   CHOLHDL 4.1 12/21/2022   VLDL 16 12/21/2022   LDLCALC 149 (H) 12/21/2022   LDLCALC 110 (H) 03/13/2011   Physical Findings: AIMS: Facial and Oral Movements Muscles of Facial Expression: None, normal Lips and Perioral Area: None, normal Jaw: None, normal Tongue: None, normal,Extremity Movements Upper (arms, wrists, hands, fingers): None, normal Lower (legs, knees, ankles, toes): None, normal, Trunk Movements Neck, shoulders, hips: None, normal, Overall Severity Severity of abnormal movements (highest score from questions above): None, normal Incapacitation due to abnormal movements: None, normal Patient's awareness of abnormal movements (rate only patient's report): No Awareness, Dental Status Current problems with teeth and/or dentures?: No Does patient usually wear dentures?: No   Musculoskeletal: Strength & Muscle Tone: within normal limits Gait & Station: normal Patient leans: N/A  Psychiatric Specialty Exam:  Presentation  General Appearance:  Lying down in bed, better hygiene noted  Eye Contact: Fair Speech: Garbled  Speech Volume: Decreased  Handedness: Right  Mood and Affect  Mood: Depressed (worried)  Affect: Congruent  Thought Process  Thought Processes: Coherent; Goal Directed; Linear  Descriptions of Associations:Intact  Orientation:Full (Time, Place and Person)  Thought Content: Denies SI HI or AVH does not present responding to stimuli. Presentation consistent with moderate intellectual disability  History of Schizophrenia/Schizoaffective disorder:Yes  Duration of Psychotic Symptoms:Greater than six months  Hallucinations:No data recorded   Ideas of Reference:None  Suicidal  Thoughts:No data recorded   Homicidal Thoughts:No data recorded   Sensorium  Memory: Immediate Fair; Recent Fair; Remote Poor  Judgment: Limited Insight: Limited Executive Functions  Concentration: Good  Attention Span:  Good  Recall: [employer] of Knowledge: Poor  Language: Fair  Psychomotor Activity  Psychomotor Activity: No data recorded   Assets  Assets: Communication Skills; Desire for Improvement; Financial Resources/Insurance; Physical Health; Resilience; Social Support  Sleep  Sleep: No data recorded   Physical Exam: Physical Exam Vitals and nursing note reviewed.  Constitutional:      General: She is not in acute distress.    Appearance: She is obese. She is not ill-appearing or diaphoretic.  HENT:     Head: Normocephalic.     Comments: Intellectual disability    Nose: No congestion.     Mouth/Throat:     Mouth: Mucous membranes are moist.     Pharynx: Oropharynx is clear.  Eyes:     Pupils: Pupils are equal, round, and reactive to light.  Cardiovascular:     Rate and Rhythm: Normal rate.     Pulses: Normal pulses.  Pulmonary:     Effort: Pulmonary effort is normal. No respiratory distress.  Abdominal:     Comments: Deferred  Genitourinary:    Comments: Deferred Musculoskeletal:        General: Normal range of motion.     Cervical back: Normal range of motion.  Skin:    General: Skin is warm.  Neurological:     General: No focal deficit present.     Mental Status: She is alert.  Psychiatric:        Behavior: Behavior normal.   Blood pressure 96/80, pulse 96, temperature 98.2 F (36.8 C), temperature source Oral, resp. rate 19, height 4\' 11"  (1.499 m), weight 63 kg, SpO2 100%. Body mass index is 28.05 kg/m.  Treatment Plan Summary: Daily contact with patient to assess and evaluate symptoms and progress in treatment and Medication management.   Still waiting on safe disposition as patient would not be able to live  independently given cognitive impairment. Recommend assisted living.   No medication side effects.  Because of urinary incontinence, recommended adult diapers to be worn.  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  Plan:  -Continue Vitamin D 50.000 units weekly for bone health. -Continue Sertraline150 mg po Q daily for depression/anxiety -Continue Buspar 15 mg po bid for anxiety.  -Continue paliperidone 6 mg po qd for mood stabilization -Continue Trazodone 50 mg nightly for insomnia  -Continue Nicoderm 21 mg topically Q 24 hrs for nicotine withdrawal management. -Continue vitamin B12 1000 mcg IM weekly for 4 weeks then monthly -Continue Melatonin 5 mg nightly for sleep -Continue Protonix EC 40 mg p.o. daily for GERD -Continue MiraLAX 17 g p.o. PRN for constipation -Continue hydroxyzine 25 mg p.o. 3 times daily as needed for anxiety -Continue Ensure nutritional shakes TID in between meals  -Previously discontinued Hydroxyzine 50 mg nightly-hypotension in the mornings  Safety and Monitoring: Voluntary admission to inpatient psychiatric unit for safety, stabilization and treatment Daily contact with patient to assess and evaluate symptoms and progress in treatment Patient's case to be discussed in multi-disciplinary team meeting Observation Level : q15 minute checks Vital signs: q12 hours Precautions: Safety   Discharge Planning: Social work and case management to assist with discharge planning and identification of hospital follow-up needs prior to discharge Estimated LOS: Unknown at this time. Discharge Concerns: Need to establish a safety plan; Medication compliance and  effectiveness Discharge Goals: Return home with  outpatient referrals for mental health follow-up including medication management/psychotherapy No legal guardian, has payee  Armandina Stammer, NP.Marland Kitchen [id number]. 02/16/2023, 4:05 PM Patient ID: Larey Brick, female   DOB: 10-15-1975, 47 y.o.    Patient ID: KAILEIGH DUXBURY, female   DOB: Nov 08, 1975, 47 y.o.   MRN: [id number] Patient ID: KANDANCE CHAVARIA, female   DOB: Apr 10, 1976, 47 y.o.   MRN: [id number] Patient ID: DZIYAH ECKHOLM, female   DOB: Dec 09, 1975, 47 y.o.   MRN: [id number] Patient ID: CAROLANN LAFONT, female   DOB: 07-Jul-1976, 47 y.o.   MRN: [id number] Patient ID: KIHARA SAADAT, female   DOB: 02/15/1976, 47 y.o.   MRN: [id number]

## 2023-02-16 NOTE — Progress Notes (Signed)
   02/16/23 2206  Psych Admission Type (Psych Patients Only)  Admission Status Involuntary  Psychosocial Assessment  Patient Complaints Anxiety;Depression  Eye Contact Fair  Facial Expression Flat  Affect Appropriate to circumstance  Speech Logical/coherent  Interaction Childlike;Minimal  Motor Activity Slow  Appearance/Hygiene Improved  Behavior Characteristics Appropriate to situation  Mood Pleasant  Thought Process  Coherency WDL  Content WDL  Delusions None reported or observed  Perception WDL  Hallucination None reported or observed  Judgment Limited  Confusion None  Danger to Self  Current suicidal ideation? Denies  Self-Injurious Behavior No self-injurious ideation or behavior indicators observed or expressed   Agreement Not to Harm Self Yes  Description of Agreement verbal  Danger to Others  Danger to Others None reported or observed

## 2023-02-16 NOTE — Group Note (Signed)
Date:  02/16/2023 Time:  12:58 PM  Group Topic/Focus:  Dimensions of Wellness:   The focus of this group is to introduce the topic of wellness and discuss the role each dimension of wellness plays in total health.    Participation Level:  Active  Participation Quality:  Appropriate  Affect:  Appropriate  Cognitive:  Appropriate  Insight: Appropriate  Engagement in Group:  Engaged  Modes of Intervention:  Discussion  Additional Comments:   Pt participated in the Social Wellness group.  Yolanda [name] 02/16/2023, 12:58 PM

## 2023-02-16 NOTE — [facility] Group Notes (Signed)
[facility] Group Notes:  (Nursing/MHT/Case Management/Adjunct)  Date:  02/16/2023  Time:  5:32 PM  Type of Therapy:  Nurse Education  Participation Level:  Active  Participation Quality:  Appropriate and Sharing  Affect:  Appropriate  Cognitive:  Alert, Appropriate, and Oriented  Insight:  Appropriate and Good  Engagement in Group:  Engaged  Modes of Intervention:  Discussion, Education, Exploration, and Support  Summary of Progress/Problems: Group about Maslow's Hierarchy of Needs and how needs relate to mental health and stability. Healthy discussion about ways to improve self care and positive relationships.  Karren Burly 02/16/2023, 5:32 PM

## 2023-02-16 NOTE — Progress Notes (Signed)
   02/16/23 0938  Psych Admission Type (Psych Patients Only)  Admission Status Involuntary  Psychosocial Assessment  Patient Complaints Irritability  Eye Contact Poor  Facial Expression Anxious  Affect Depressed;Angry  Speech Argumentative  Interaction Minimal  Motor Activity Slow  Appearance/Hygiene Improved  Behavior Characteristics Unwilling to participate;Agitated;Guarded;Irritable  Mood Angry  Thought Process  Coherency WDL  Content WDL  Delusions None reported or observed  Perception WDL  Hallucination None reported or observed  Judgment Limited  Confusion None  Danger to Self  Current suicidal ideation? Denies  Self-Injurious Behavior No self-injurious ideation or behavior indicators observed or expressed   Agreement Not to Harm Self Yes  Description of Agreement verbal  Danger to Others  Danger to Others None reported or observed   Pt was irritable and angry getting out of bed this morning and therefor was late taking scheduled morning medications. Pt encouraged to attend group.

## 2023-02-17 DIAGNOSIS — F251 Schizoaffective disorder, depressive type: Secondary | ICD-10-CM | POA: Diagnosis not present

## 2023-02-17 NOTE — Progress Notes (Signed)
   02/17/23 2313  Psych Admission Type (Psych Patients Only)  Admission Status Involuntary  Psychosocial Assessment  Patient Complaints Depression  Eye Contact Fair  Facial Expression Flat  Affect Appropriate to circumstance  Speech Logical/coherent  Interaction Childlike  Motor Activity Slow  Appearance/Hygiene Body odor  Behavior Characteristics Appropriate to situation  Mood Depressed;Pleasant  Thought Process  Coherency WDL  Content WDL  Delusions None reported or observed  Perception WDL  Hallucination None reported or observed  Judgment Limited  Confusion None  Danger to Self  Current suicidal ideation? Denies  Self-Injurious Behavior No self-injurious ideation or behavior indicators observed or expressed   Agreement Not to Harm Self Yes  Description of Agreement verbal  Danger to Others  Danger to Others None reported or observed

## 2023-02-17 NOTE — Plan of Care (Signed)
  Problem: Activity: Goal: Interest or engagement in leisure activities will improve Outcome: Progressing   Problem: Activity: Goal: Imbalance in normal sleep/wake cycle will improve Outcome: Progressing

## 2023-02-17 NOTE — Group Note (Signed)
Date:  02/17/2023 Time:  4:45 PM  Group Topic/Focus:  Goals Group:   The focus of this group is to help patients establish daily goals to achieve during treatment and discuss how the patient can incorporate goal setting into their daily lives to aide in recovery. Orientation:   The focus of this group is to educate the patient on the purpose and policies of crisis stabilization and provide a format to answer questions about their admission.  The group details unit policies and expectations of patients while admitted.    Participation Level:  Did Not Attend  Participation Quality:   n/a  Affect:   n/a  Cognitive:   n/a  Insight: None  Engagement in Group:   n/a  Modes of Intervention:   n/a  Additional Comments:   Pt did not attend.  Edmund Hilda Tanairy Payeur 02/17/2023, 4:45 PM

## 2023-02-17 NOTE — Progress Notes (Signed)
   02/17/23 0859  Psych Admission Type (Psych Patients Only)  Admission Status Involuntary  Psychosocial Assessment  Patient Complaints Depression  Eye Contact Poor  Facial Expression Flat  Affect Flat  Speech Logical/coherent  Interaction Childlike  Motor Activity Slow  Appearance/Hygiene Body odor  Behavior Characteristics Unwilling to participate;Guarded  Mood Depressed  Thought Process  Coherency WDL  Content WDL  Delusions None reported or observed  Perception WDL  Hallucination None reported or observed  Judgment Limited  Confusion None  Danger to Self  Current suicidal ideation? Passive  Self-Injurious Behavior No self-injurious ideation or behavior indicators observed or expressed   Agreement Not to Harm Self Yes  Description of Agreement verbal  Danger to Others  Danger to Others None reported or observed

## 2023-02-17 NOTE — [facility] Group Notes (Signed)
[facility] Group Notes:  (Nursing/MHT/Case Management/Adjunct)  Date:  02/17/2023  Time:  9:03 PM  Type of Therapy:   Wrap-up group  Participation Level:  Active  Participation Quality:  Appropriate  Affect:  Appropriate  Cognitive:  Appropriate  Insight:  Appropriate  Engagement in Group:  Engaged  Modes of Intervention:  Education  Summary of Progress/Problems: Pt goal to go home. Rated day 6/10.   Yolanda [name] 02/17/2023, 9:03 PM

## 2023-02-17 NOTE — Progress Notes (Signed)
Patient ID: Yolanda [name], female   DOB: Nov 09, 1975, 47 y.o.   MRN: [id number] [facility] MD Progress Note  02/17/2023 2:15 PM Yolanda [name]  MRN:  [id number]  Reason for admission: This is the first psychiatric admission in this [facility] in 12 years for this 68 AA female with an extensive hx of mental illnesses & probable polysubstance use disorders. She is admitted to the [name] from the [facility] hospital with complain of worsening suicidal ideations with plan to stab herself. Per chart review, patient apparently reported at the ED that she has been depressed for a while & has not been taking her mental health medications. After medical evaluation.clearance, she was transferred to the [facility] for further psychiatric evaluation/treatments.   APS report on 12/31/22 Has payee, no legal guardian Continues to await for placement.  24 hr chart review: V/S WNL. Pt remains compliant with medications, isolative to the room & continuing to require positive verbal reinforcements to get out of her room. no behavioral issues reported overnight.   Daily notes: Yolanda [name] remains without any significant changes. She is taking her medications as recommended. She is out of her room to the dayroom on daily basis, attending group sessions. However, she does need some encouragement sometimes to come out of her room/attend group sessions sometimes. She reports today, "I'm okay, just sleepy". Patient is currently receiving maintenance therapy to keep her mood stable. She is currently waiting for placement. The DSS is involved in her case now trying to find patient a suitable living environment. And as of today, there are no new news that a place has been secured for her. She is being instructed & encouraged to continue to exercise some patience. She currently denies any SIHI, AVH, delusional thoughts or paranoia. She does not appear to be responding to any internal stimuli. There are no changes made on her current plan of care.  Continue as already in progress. Vital signs remain stable.    Pt has continued to verbalize her frutration with continuing to remain hospitalized. We have educated her that we are awaiting DSS to coordinate with CSW to place her in a safe living arrangement outside of the hospital environment, but she is seeming not to understanding this. She verbalizes feelings of abandonment by her family. She reports a fair sleep quality last night, staff reports that her sleep quality was good. She reports a good appetite, denies being in any physical pain, states that she is moving her bowels well.  We're continuing medications as listed below. No TD/EPS type symptoms found on assessment, and pt denies any feelings of stiffness. AIMS: 0.   Principal Problem: Schizoaffective disorder, depressive type (HCC)   Diagnosis: Principal Problem:   Schizoaffective disorder, depressive type (HCC) Active Problems:   GERD (gastroesophageal reflux disease)   Intellectual disability   Tobacco use disorder  Past Psychiatric History: Patient is not forth coming with the answers to the assessment questions. She is refusing to provide information about her mental health hx or symptoms.   Past Medical History:  Past Medical History:  Diagnosis Date   Bipolar affect, depressed (HCC)    Constipation 08/17/2022   Depression    Falls 07/21/2022   Fracture of femoral neck, right, closed (HCC) 01/17/2022   Herpes simplex 08/22/2017   Open fracture dislocation of right elbow joint 01/17/2022    Past Surgical History:  Procedure Laterality Date   NO PAST SURGERIES     SALPINGECTOMY     Family History: History reviewed.  No pertinent family history.  Family Psychiatric  History: See H&P.  Social History:  Social History   Substance and Sexual Activity  Alcohol Use Yes     Social History   Substance and Sexual Activity  Drug Use Yes   Types: Cocaine, Marijuana    Social History   Socioeconomic History    Marital status: Single    Spouse name: Not on file   Number of children: Not on file   Years of education: Not on file   Highest education level: Not on file  Occupational History   Not on file  Tobacco Use   Smoking status: Every Day   Smokeless tobacco: Not on file  Substance and Sexual Activity   Alcohol use: Yes   Drug use: Yes    Types: Cocaine, Marijuana   Sexual activity: Yes  Other Topics Concern   Not on file  Social History Narrative   Not on file   Social Determinants of Health   Financial Resource Strain: Low Risk  (09/12/2022)   Received from [facility], [facility]   Overall Financial Resource Strain (CARDIA)    Difficulty of Paying Living Expenses: Not hard at all  Food Insecurity: Patient Declined (12/20/2022)   Hunger Vital Sign    Worried About Running Out of Food in the Last Year: Patient declined    Ran Out of Food in the Last Year: Patient declined  Transportation Needs: No Transportation Needs (12/20/2022)   PRAPARE - Administrator, Civil Service (Medical): No    Lack of Transportation (Non-Medical): No  Physical Activity: Not on file  Stress: No Stress Concern Present (07/17/2022)   Received from [facility], [facility] of Occupational Health - Occupational Stress Questionnaire    Feeling of Stress : Not at all  Social Connections: Unknown (07/16/2022)   Received from [facility], [facility]   Social Network    Social Network: Not on file   Current Medications: Current Facility-Administered Medications  Medication Dose Route Frequency Provider Last Rate Last Admin   acetaminophen (TYLENOL) tablet 650 mg  650 mg Oral Q6H PRN [name], NP   650 mg at 02/11/23 1727   alum & mag hydroxide-simeth (MAALOX/MYLANTA) 200-200-20 MG/5ML suspension 30 mL  30 mL Oral Q4H PRN [name], NP   30 mL at 01/28/23 1530   busPIRone (BUSPAR) tablet 15 mg  15 mg Oral BID [name], NP   15 mg at 02/17/23 0859    cyanocobalamin (VITAMIN B12) injection 1,000 mcg  1,000 mcg Intramuscular Q30 days [name], Nadir, MD   1,000 mcg at 02/02/23 1238   diphenhydrAMINE (BENADRYL) capsule 50 mg  50 mg Oral TID PRN [name], NP   50 mg at 01/15/23 1211   Or   diphenhydrAMINE (BENADRYL) injection 50 mg  50 mg Intramuscular TID PRN [name], NP       feeding supplement (ENSURE ENLIVE / ENSURE PLUS) liquid 237 mL  237 mL Oral TID BM Nkwenti, Doris, NP   237 mL at 02/16/23 2052   haloperidol (HALDOL) tablet 5 mg  5 mg Oral TID PRN [name], NP   5 mg at 01/15/23 1211   Or   haloperidol lactate (HALDOL) injection 5 mg  5 mg Intramuscular TID PRN [name], NP       hydrOXYzine (ATARAX) tablet 25 mg  25 mg Oral TID PRN [name], DO   25 mg at 02/16/23  2053   LORazepam (ATIVAN) tablet 2 mg  2 mg Oral TID PRN [name], NP   2 mg at 01/15/23 1211   Or   LORazepam (ATIVAN) injection 2 mg  2 mg Intramuscular TID PRN [name], NP       magnesium hydroxide (MILK OF MAGNESIA) suspension 30 mL  30 mL Oral Daily PRN [name], NP   30 mL at 02/15/23 1318   melatonin tablet 5 mg  5 mg Oral QHS Nkwenti, Doris, NP   5 mg at 02/16/23 2053   nicotine (NICODERM CQ - dosed in mg/24 hours) patch 21 mg  21 mg Transdermal Daily [name], DO   21 mg at 02/16/23 0940   paliperidone (INVEGA) 24 hr tablet 6 mg  6 mg Oral Daily [name], NP   6 mg at 02/16/23 2053   pantoprazole (PROTONIX) EC tablet 40 mg  40 mg Oral Daily [name], NP   40 mg at 02/17/23 0859   polyethylene glycol (MIRALAX / GLYCOLAX) packet 17 g  17 g Oral Daily PRN [name], NP   17 g at 02/13/23 1731   sertraline (ZOLOFT) tablet 150 mg  150 mg Oral Daily Massengill, Harrold Donath, MD   150 mg at 02/17/23 0859   traZODone (DESYREL) tablet 50 mg  50 mg Oral QHS [name], NP   50 mg at 02/16/23 2053   Lab Results:  No results found for this or any previous visit (from the past 48 hour(s)).  Blood Alcohol level:   Lab Results  Component Value Date   ETH <10 12/19/2022   ETH <10 11/21/2019   Metabolic Disorder Labs: Lab Results  Component Value Date   HGBA1C 4.4 (L) 12/21/2022   MPG 79.58 12/21/2022   No results found for: "PROLACTIN" Lab Results  Component Value Date   CHOL 218 (H) 12/21/2022   TRIG 81 12/21/2022   HDL 53 12/21/2022   CHOLHDL 4.1 12/21/2022   VLDL 16 12/21/2022   LDLCALC 149 (H) 12/21/2022   LDLCALC 110 (H) 03/13/2011   Physical Findings: AIMS: Facial and Oral Movements Muscles of Facial Expression: None, normal Lips and Perioral Area: None, normal Jaw: None, normal Tongue: None, normal,Extremity Movements Upper (arms, wrists, hands, fingers): None, normal Lower (legs, knees, ankles, toes): None, normal, Trunk Movements Neck, shoulders, hips: None, normal, Overall Severity Severity of abnormal movements (highest score from questions above): None, normal Incapacitation due to abnormal movements: None, normal Patient's awareness of abnormal movements (rate only patient's report): No Awareness, Dental Status Current problems with teeth and/or dentures?: No Does patient usually wear dentures?: No   Musculoskeletal: Strength & Muscle Tone: within normal limits Gait & Station: normal Patient leans: N/A  Psychiatric Specialty Exam:  Presentation  General Appearance:  Lying down in bed, better hygiene noted  Eye Contact: Fair Speech: Garbled  Speech Volume: Decreased  Handedness: Right  Mood and Affect  Mood: -- (continues to improve.)  Affect: Congruent  Thought Process  Thought Processes: Coherent; Goal Directed; Linear  Descriptions of Associations:Intact  Orientation:Full (Time, Place and Person)  Thought Content: Denies SI HI or AVH does not present responding to stimuli. Presentation consistent with moderate intellectual disability  History of Schizophrenia/Schizoaffective disorder:Yes  Duration of Psychotic Symptoms:Greater than  six months  Hallucinations:Hallucinations: None Description of Auditory Hallucinations: NA    Ideas of Reference:None  Suicidal Thoughts:Suicidal Thoughts: No SI Active Intent and/or Plan: Without Intent; Without Plan; Without Means to Carry Out; Without Access to Means  SI Passive Intent and/or Plan: Without Intent; Without Plan; Without Means to Carry Out; Without Access to Means    Homicidal Thoughts:Homicidal Thoughts: No    Sensorium  Memory: Immediate Fair; Recent Fair; Remote Fair  Judgment: Limited Insight: Limited Executive Functions  Concentration: Good  Attention Span: Good  Recall: Fair  Fund of Knowledge: -- (Limited)  Language: Fair  Psychomotor Activity  Psychomotor Activity: Psychomotor Activity: Normal    Assets  Assets: Communication Skills; Desire for Improvement; Financial Resources/Insurance; Resilience; Social Support  Sleep  Sleep: Sleep: Good Number of Hours of Sleep: 8  Physical Exam: Physical Exam Vitals and nursing note reviewed.  Constitutional:      General: She is not in acute distress.    Appearance: She is obese. She is not ill-appearing or diaphoretic.  HENT:     Head: Normocephalic.     Comments: Intellectual disability    Nose: No congestion.     Mouth/Throat:     Mouth: Mucous membranes are moist.     Pharynx: Oropharynx is clear.  Eyes:     Pupils: Pupils are equal, round, and reactive to light.  Cardiovascular:     Rate and Rhythm: Normal rate.     Pulses: Normal pulses.  Pulmonary:     Effort: Pulmonary effort is normal. No respiratory distress.  Abdominal:     Comments: Deferred  Genitourinary:    Comments: Deferred Musculoskeletal:        General: Normal range of motion.     Cervical back: Normal range of motion.  Skin:    General: Skin is warm.  Neurological:     General: No focal deficit present.     Mental Status: She is alert.  Psychiatric:        Behavior: Behavior normal.     Blood pressure 95/72, pulse 75, temperature 97.7 F (36.5 C), temperature source Oral, resp. rate 13, height 4\' 11"  (1.499 m), weight 63 kg, SpO2 100%. Body mass index is 28.05 kg/m.  Treatment Plan Summary: Daily contact with patient to assess and evaluate symptoms and progress in treatment and Medication management.   Still waiting on safe disposition as patient would not be able to live independently given cognitive impairment. Recommend assisted living.   No medication side effects.  Because of urinary incontinence, recommended adult diapers to be worn.  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  Plan:  -Continue Vitamin D 50.000 units weekly for bone health. -Continue Sertraline150 mg po Q daily for depression/anxiety -Continue Buspar 15 mg po bid for anxiety.  -Continue paliperidone 6 mg po qd for mood stabilization -Continue Trazodone 50 mg nightly for insomnia  -Continue Nicoderm 21 mg topically Q 24 hrs for nicotine withdrawal management. -Continue vitamin B12 1000 mcg IM weekly for 4 weeks then monthly -Continue Melatonin 5 mg nightly for sleep -Continue Protonix EC 40 mg p.o. daily for GERD -Continue MiraLAX 17 g p.o. PRN for constipation -Continue hydroxyzine 25 mg p.o. 3 times daily as needed for anxiety -Continue Ensure nutritional shakes TID in between meals  -Previously discontinued Hydroxyzine 50 mg nightly-hypotension in the mornings  Safety and Monitoring: Voluntary admission to inpatient psychiatric unit for safety, stabilization and treatment Daily contact with patient to assess and evaluate symptoms and progress in treatment Patient's case to be discussed in  multi-disciplinary team  meeting Observation Level : q15 minute checks Vital signs: q12 hours Precautions: Safety   Discharge Planning: Social work and case management to assist with discharge planning and identification of hospital follow-up needs prior to discharge Estimated LOS: Unknown at this time. Discharge Concerns: Need to establish a safety plan; Medication compliance and effectiveness Discharge Goals: Return home with outpatient referrals for mental health follow-up including medication management/psychotherapy No legal guardian, has payee  Armandina Stammer, NP.Marland Kitchen [id number]. 02/17/2023, 2:15 PM Patient ID: Larey Brick, female   DOB: [birth year], 47 y.o.    Patient ID: PAMALA HITSON, female   DOB: [birth year], 47 y.o.   MRN: [id number] Patient ID: ILAYNA SPATH, female   DOB: 1976-04-27, 47 y.o.   MRN: [id number] Patient ID: MARCIELA ARNTZ, female   DOB: May 22, 1976, 47 y.o.   MRN: [id number] Patient ID: AMARIONA TOUSLEY, female   DOB: [birth year], 47 y.o.   MRN: [id number] Patient ID: AVIAH BUNTAIN, female   DOB: [birth year], 47 y.o.   MRN: [id number] Patient ID: MERYAM CRADLE, female   DOB: 02/18/1976, 47 y.o.   MRN: [id number]

## 2023-02-18 ENCOUNTER — Ambulatory Visit (HOSPITAL_COMMUNITY): Payer: MEDICAID | Admitting: Psychiatry

## 2023-02-18 ENCOUNTER — Encounter (HOSPITAL_COMMUNITY): Payer: Self-pay

## 2023-02-18 ENCOUNTER — Ambulatory Visit (HOSPITAL_COMMUNITY): Payer: Medicaid Other | Admitting: Clinical

## 2023-02-18 DIAGNOSIS — F251 Schizoaffective disorder, depressive type: Secondary | ICD-10-CM | POA: Diagnosis not present

## 2023-02-18 NOTE — Plan of Care (Signed)

## 2023-02-18 NOTE — Group Note (Signed)
Occupational Therapy Group Note  Group Topic: Sleep Hygiene  Group Date: 02/18/2023 Start Time: 1430 End Time: 1505 Facilitators: Ted Mcalpine, OT   Group Description: Group encouraged increased participation and engagement through topic focused on sleep hygiene. Patients reflected on the quality of sleep they typically receive and identified areas that need improvement. Group was given background information on sleep and sleep hygiene, including common sleep disorders. Group members also received information on how to improve one's sleep and introduced a sleep diary as a tool that can be utilized to track sleep quality over a length of time. Group session ended with patients identifying one or more strategies they could utilize or implement into their sleep routine in order to improve overall sleep quality.        Therapeutic Goal(s):  Identify one or more strategies to improve overall sleep hygiene  Identify one or more areas of sleep that are negatively impacted (sleep too much, too little, etc)     Participation Level: Engaged   Participation Quality: Independent   Behavior: Appropriate   Speech/Thought Process: Relevant   Affect/Mood: Appropriate   Insight: Fair   Judgement: Fair      Modes of Intervention: Education  Patient Response to Interventions:  Engaged   Plan: Continue to engage patient in OT groups 2 - 3x/week.  02/18/2023  Ted Mcalpine, OT  Kerrin Champagne, OT

## 2023-02-18 NOTE — BH IP Treatment Plan (Signed)
Interdisciplinary Treatment and Diagnostic Plan Update  02/18/2023 Time of Session: 934am Yolanda [name] MRN: [id number]  Principal Diagnosis: Schizoaffective disorder, depressive type (HCC)  Secondary Diagnoses: Principal Problem:   Schizoaffective disorder, depressive type (HCC) Active Problems:   GERD (gastroesophageal reflux disease)   Intellectual disability   Tobacco use disorder   Current Medications:  Current Facility-Administered Medications  Medication Dose Route Frequency Provider Last Rate Last Admin   acetaminophen (TYLENOL) tablet 650 mg  650 mg Oral Q6H PRN [name], NP   650 mg at 02/11/23 1727   alum & mag hydroxide-simeth (MAALOX/MYLANTA) 200-200-20 MG/5ML suspension 30 mL  30 mL Oral Q4H PRN [name], NP   30 mL at 01/28/23 1530   busPIRone (BUSPAR) tablet 15 mg  15 mg Oral BID [name], NP   15 mg at 02/18/23 0900   cyanocobalamin (VITAMIN B12) injection 1,000 mcg  1,000 mcg Intramuscular Q30 days [name], Nadir, MD   1,000 mcg at 02/02/23 1238   diphenhydrAMINE (BENADRYL) capsule 50 mg  50 mg Oral TID PRN [name], NP   50 mg at 01/15/23 1211   Or   diphenhydrAMINE (BENADRYL) injection 50 mg  50 mg Intramuscular TID PRN [name], NP       feeding supplement (ENSURE ENLIVE / ENSURE PLUS) liquid 237 mL  237 mL Oral TID BM Nkwenti, Doris, NP   237 mL at 02/17/23 2102   haloperidol (HALDOL) tablet 5 mg  5 mg Oral TID PRN [name], NP   5 mg at 01/15/23 1211   Or   haloperidol lactate (HALDOL) injection 5 mg  5 mg Intramuscular TID PRN [name], NP       hydrOXYzine (ATARAX) tablet 25 mg  25 mg Oral TID PRN [name], DO   25 mg at 02/16/23 2053   LORazepam (ATIVAN) tablet 2 mg  2 mg Oral TID PRN [name], NP   2 mg at 01/15/23 1211   Or   LORazepam (ATIVAN) injection 2 mg  2 mg Intramuscular TID PRN [name], NP       magnesium hydroxide (MILK OF MAGNESIA) suspension 30 mL  30 mL Oral Daily PRN [name], NP    30 mL at 02/15/23 1318   melatonin tablet 5 mg  5 mg Oral QHS Nkwenti, Doris, NP   5 mg at 02/17/23 2102   nicotine (NICODERM CQ - dosed in mg/24 hours) patch 21 mg  21 mg Transdermal Daily [name], DO   21 mg at 02/18/23 0901   paliperidone (INVEGA) 24 hr tablet 6 mg  6 mg Oral Daily [name], NP   6 mg at 02/17/23 2102   pantoprazole (PROTONIX) EC tablet 40 mg  40 mg Oral Daily Nwoko, Nicole Kindred I, NP   40 mg at 02/18/23 0900   polyethylene glycol (MIRALAX / GLYCOLAX) packet 17 g  17 g Oral Daily PRN [name], NP   17 g at 02/13/23 1731   sertraline (ZOLOFT) tablet 150 mg  150 mg Oral Daily Massengill, Harrold Donath, MD   150 mg at 02/18/23 0859   traZODone (DESYREL) tablet 50 mg  50 mg Oral QHS [name], NP   50 mg at 02/17/23 2101   PTA Medications: Medications Prior to Admission  Medication Sig Dispense Refill Last Dose   busPIRone (BUSPAR) 15 MG tablet Take 15 mg by mouth 2 (two) times daily. (Patient not taking: Reported on 12/19/2022)      paliperidone (INVEGA  SUSTENNA) 156 MG/ML SUSY injection Inject 156 mg into the muscle once. (Patient not taking: Reported on 12/19/2022)      sertraline (ZOLOFT) 50 MG tablet Take 150 mg by mouth daily. (Patient not taking: Reported on 12/19/2022)      traZODone (DESYREL) 100 MG tablet Take 100 mg by mouth at bedtime as needed for sleep. (Patient not taking: Reported on 11/12/2022)       Patient Stressors: Medication change or noncompliance    Patient Strengths: Forensic psychologist fund of knowledge   Treatment Modalities: Medication Management, Group therapy, Case management,  1 to 1 session with clinician, Psychoeducation, Recreational therapy.   Physician Treatment Plan for Primary Diagnosis: Schizoaffective disorder, depressive type (HCC) Long Term Goal(s): Improvement in symptoms so as ready for discharge   Short Term Goals: Ability to identify and develop effective coping behaviors will improve Ability to maintain  clinical measurements within normal limits will improve Compliance with prescribed medications will improve Ability to identify triggers associated with substance abuse/mental health issues will improve Ability to identify changes in lifestyle to reduce recurrence of condition will improve Ability to verbalize feelings will improve Ability to disclose and discuss suicidal ideas Ability to demonstrate self-control will improve  Medication Management: Evaluate patient's response, side effects, and tolerance of medication regimen.  Therapeutic Interventions: 1 to 1 sessions, Unit Group sessions and Medication administration.  Evaluation of Outcomes: Progressing  Physician Treatment Plan for Secondary Diagnosis: Principal Problem:   Schizoaffective disorder, depressive type (HCC) Active Problems:   GERD (gastroesophageal reflux disease)   Intellectual disability   Tobacco use disorder  Long Term Goal(s): Improvement in symptoms so as ready for discharge   Short Term Goals: Ability to identify and develop effective coping behaviors will improve Ability to maintain clinical measurements within normal limits will improve Compliance with prescribed medications will improve Ability to identify triggers associated with substance abuse/mental health issues will improve Ability to identify changes in lifestyle to reduce recurrence of condition will improve Ability to verbalize feelings will improve Ability to disclose and discuss suicidal ideas Ability to demonstrate self-control will improve     Medication Management: Evaluate patient's response, side effects, and tolerance of medication regimen.  Therapeutic Interventions: 1 to 1 sessions, Unit Group sessions and Medication administration.  Evaluation of Outcomes: Progressing   RN Treatment Plan for Primary Diagnosis: Schizoaffective disorder, depressive type (HCC) Long Term Goal(s): Knowledge of disease and therapeutic regimen to  maintain health will improve  Short Term Goals: Ability to remain free from injury will improve, Ability to verbalize frustration and anger appropriately will improve, Ability to demonstrate self-control, Ability to participate in decision making will improve, Ability to verbalize feelings will improve, Ability to disclose and discuss suicidal ideas, Ability to identify and develop effective coping behaviors will improve, and Compliance with prescribed medications will improve  Medication Management: RN will administer medications as ordered by provider, will assess and evaluate patient's response and provide education to patient for prescribed medication. RN will report any adverse and/or side effects to prescribing provider.  Therapeutic Interventions: 1 on 1 counseling sessions, Psychoeducation, Medication administration, Evaluate responses to treatment, Monitor vital signs and CBGs as ordered, Perform/monitor CIWA, COWS, AIMS and Fall Risk screenings as ordered, Perform wound care treatments as ordered.  Evaluation of Outcomes: Progressing   LCSW Treatment Plan for Primary Diagnosis: Schizoaffective disorder, depressive type (HCC) Long Term Goal(s): Safe transition to appropriate next level of care at discharge, Engage patient in therapeutic group addressing interpersonal concerns.  Short Term Goals: Engage patient in aftercare planning with referrals and resources, Increase social support, Increase ability to appropriately verbalize feelings, Increase emotional regulation, Facilitate acceptance of mental health diagnosis and concerns, Facilitate patient progression through stages of change regarding substance use diagnoses and concerns, Identify triggers associated with mental health/substance abuse issues, and Increase skills for wellness and recovery  Therapeutic Interventions: Assess for all discharge needs, 1 to 1 time with Social worker, Explore available resources and support systems,  Assess for adequacy in community support network, Educate family and significant other(s) on suicide prevention, Complete Psychosocial Assessment, Interpersonal group therapy.  Evaluation of Outcomes: Progressing   Progress in Treatment: Attending groups: Yes. Participating in groups: Yes. Taking medication as prescribed: Yes. Toleration medication: Yes. Family/Significant other contact made: No, will contact:  has meeting with social worker with DSS this week Patient understands diagnosis: No. Discussing patient identified problems/goals with staff: Yes. Medical problems stabilized or resolved: Yes. Denies suicidal/homicidal ideation: Yes. Issues/concerns per patient self-inventory: No. Other: none  New problem(s) identified: No, Describe:  none reported  New Short Term/Long Term Goal(s):medication stabilization, elimination of SI thoughts, development of comprehensive mental wellness plan.    Patient Goals:  coping skills  Discharge Plan or Barriers:  CSW will continue to follow and assess for appropriate referrals and possible discharge planning.     Reason for Continuation of Hospitalization: Medication stabilization  Estimated Length of Stay: unknown  Last 3 [place] Suicide Severity Risk Score: Flowsheet Row Admission (Current) [facility] from 12/19/2022 in [place] [facility] at [facility] from 11/12/2022 in [facility] at [facility]  C-SSRS RISK CATEGORY High Risk High Risk Moderate Risk       Last [place] 2/9 Scores:     No data to display          Scribe for Treatment Team: Charise Killian 02/18/2023 9:34 AM

## 2023-02-18 NOTE — [facility] Group Notes (Signed)
Spiritual care group facilitated by Chaplain Katy Claussen, BCC  Group focused on topic of strength. Group members reflected on what thoughts and feelings emerge when they hear this topic. They then engaged in facilitated dialog around how strength is present in their lives. This dialog focused on representing what strength had been to them in their lives (images and patterns given) and what they saw as helpful in their life now (what they needed / wanted).  Activity drew on narrative framework.  Patient Progress: Did not attend.  

## 2023-02-18 NOTE — Progress Notes (Signed)
[facility] MD Progress Note  02/18/2023 2:15 PM Yolanda [name]  MRN:  [id number] Principal Problem: Schizoaffective disorder, depressive type (HCC) Diagnosis: Principal Problem:   Schizoaffective disorder, depressive type (HCC) Active Problems:   GERD (gastroesophageal reflux disease)   Intellectual disability   Tobacco use disorder  Reason for admission: This is the first psychiatric admission in this [facility] in 12 years for this 15 AA female with an extensive hx of mental illnesses & probable polysubstance use disorders. She is admitted to the [facility] from the [facility] hospital with complain of worsening suicidal ideations with plan to stab herself. Per chart review, patient apparently reported at the ED that she has been depressed for a while & has not been taking her mental health medications. After medical evaluation.clearance, she was transferred to the [facility] for further psychiatric evaluation/treatments.   24 hr chart review: Sleep Hours last night: Not documented, but pt reports a good sleep quality last night. Nursing Concerns: None reported/documented Behavioral episodes in the past 24 FIE:PPIR  Medication Compliance: Compliant  Vital Signs in the past 24 hrs: HR earlier today morning was 136. [name] assigned RN asked to recheck vitals, and to push fluids due to concerns for dehydration.  PRN Medications in the past 24 hrs: Haldol/Benadryl & Ativan on 8/9 last for agitation. Non in last 24 hrs.  Patient assessment note: On assessment today, the pt reports that their mood is still depressed. Reports that anxiety is under control with current medication regimen. Sleep is wnl, and that she was able to sleep well last night. Appetite is gooD. Concentration is good.  Energy level is average. Denies suicidal thoughts. Denies suicidal intent and plan.  Denies having any HI.  Denies having psychotic symptoms.   Denies having side effects to current psychiatric medications. No TD/EPS type symptoms  found on assessment, and pt denies any feelings of stiffness. AIMS: 0.  We discussed no changes to current medication regimen.   Discussed the following psychosocial stressors: Lack of a current stable & safe housing in the community. We also discussed the fact that we are continuing to await for DSS to coordinate with our CSW to locate a stable and safe home for patient in the community with a goal of continuous betterment in her mental health.    Total Time spent with patient: 45 minutes  Past Psychiatric History: See H & P  Past Medical History:  Past Medical History:  Diagnosis Date   Bipolar affect, depressed (HCC)    Constipation 08/17/2022   Depression    Falls 07/21/2022   Fracture of femoral neck, right, closed (HCC) 01/17/2022   Herpes simplex 08/22/2017   Open fracture dislocation of right elbow joint 01/17/2022    Past Surgical History:  Procedure Laterality Date   NO PAST SURGERIES     SALPINGECTOMY     Family History: History reviewed. No pertinent family history. Family Psychiatric  History: See H & P Social History:  Social History   Substance and Sexual Activity  Alcohol Use Yes     Social History   Substance and Sexual Activity  Drug Use Yes   Types: Cocaine, Marijuana    Social History   Socioeconomic History   Marital status: Single    Spouse name: Not on file   Number of children: Not on file   Years of education: Not on file   Highest education level: Not on file  Occupational History   Not on file  Tobacco Use  Smoking status: Every Day   Smokeless tobacco: Not on file  Substance and Sexual Activity   Alcohol use: Yes   Drug use: Yes    Types: Cocaine, Marijuana   Sexual activity: Yes  Other Topics Concern   Not on file  Social History Narrative   Not on file   Social Determinants of Health   Financial Resource Strain: Low Risk  (09/12/2022)   Received from [facility]   Overall Financial Resource Strain (CARDIA)     Difficulty of Paying Living Expenses: Not hard at all  Food Insecurity: Patient Declined (12/20/2022)   Hunger Vital Sign    Worried About Running Out of Food in the Last Year: Patient declined    Ran Out of Food in the Last Year: Patient declined  Transportation Needs: No Transportation Needs (12/20/2022)   PRAPARE - Administrator, Civil Service (Medical): No    Lack of Transportation (Non-Medical): No  Physical Activity: Not on file  Stress: No Stress Concern Present (07/17/2022)   Received from [facility] - Occupational Stress Questionnaire    Feeling of Stress : Not at all  Social Connections: Unknown (07/16/2022)   Received from [facility]   Social Network    Social Network: Not on file    Sleep: Good  Appetite:  Good  Current Medications: Current Facility-Administered Medications  Medication Dose Route Frequency Provider Last Rate Last Admin   acetaminophen (TYLENOL) tablet 650 mg  650 mg Oral Q6H PRN [name], NP   650 mg at 02/11/23 1727   alum & mag hydroxide-simeth (MAALOX/MYLANTA) 200-200-20 MG/5ML suspension 30 mL  30 mL Oral Q4H PRN [name], NP   30 mL at 01/28/23 1530   busPIRone (BUSPAR) tablet 15 mg  15 mg Oral BID [name], NP   15 mg at 02/18/23 0900   cyanocobalamin (VITAMIN B12) injection 1,000 mcg  1,000 mcg Intramuscular Q30 days [name], Nadir, MD   1,000 mcg at 02/02/23 1238   diphenhydrAMINE (BENADRYL) capsule 50 mg  50 mg Oral TID PRN [name], NP   50 mg at 01/15/23 1211   Or   diphenhydrAMINE (BENADRYL) injection 50 mg  50 mg Intramuscular TID PRN [name], NP       feeding supplement (ENSURE ENLIVE / ENSURE PLUS) liquid 237 mL  237 mL Oral TID BM Eilene Voigt, NP   237 mL at 02/18/23 1013   haloperidol (HALDOL) tablet 5 mg  5 mg Oral TID PRN [name], NP   5 mg at 01/15/23 1211   Or   haloperidol lactate (HALDOL) injection 5 mg  5  mg Intramuscular TID PRN [name], NP       hydrOXYzine (ATARAX) tablet 25 mg  25 mg Oral TID PRN [name], DO   25 mg at 02/16/23 2053   LORazepam (ATIVAN) tablet 2 mg  2 mg Oral TID PRN [name], NP   2 mg at 01/15/23 1211   Or   LORazepam (ATIVAN) injection 2 mg  2 mg Intramuscular TID PRN [name], NP       magnesium hydroxide (MILK OF MAGNESIA) suspension 30 mL  30 mL Oral Daily PRN [name], NP   30 mL at 02/15/23 1318   melatonin tablet 5 mg  5 mg Oral QHS Malakie Balis, NP   5 mg at 02/17/23 2102   nicotine (NICODERM CQ -  dosed in mg/24 hours) patch 21 mg  21 mg Transdermal Daily [name], DO   21 mg at 02/18/23 0901   paliperidone (INVEGA) 24 hr tablet 6 mg  6 mg Oral Daily [name], NP   6 mg at 02/17/23 2102   pantoprazole (PROTONIX) EC tablet 40 mg  40 mg Oral Daily Nwoko, Nicole Kindred I, NP   40 mg at 02/18/23 0900   polyethylene glycol (MIRALAX / GLYCOLAX) packet 17 g  17 g Oral Daily PRN [name], NP   17 g at 02/13/23 1731   sertraline (ZOLOFT) tablet 150 mg  150 mg Oral Daily Massengill, Harrold Donath, MD   150 mg at 02/18/23 0859   traZODone (DESYREL) tablet 50 mg  50 mg Oral QHS [name], NP   50 mg at 02/17/23 2101    Lab Results: No results found for this or any previous visit (from the past 48 hour(s)).  Blood Alcohol level:  Lab Results  Component Value Date   ETH <10 12/19/2022   ETH <10 11/21/2019    Metabolic Disorder Labs: Lab Results  Component Value Date   HGBA1C 4.4 (L) 12/21/2022   MPG 79.58 12/21/2022   No results found for: "PROLACTIN" Lab Results  Component Value Date   CHOL 218 (H) 12/21/2022   TRIG 81 12/21/2022   HDL 53 12/21/2022   CHOLHDL 4.1 12/21/2022   VLDL 16 12/21/2022   LDLCALC 149 (H) 12/21/2022   LDLCALC 110 (H) 03/13/2011   Physical Findings: AIMS: Facial and Oral Movements Muscles of Facial Expression: None, normal Lips and Perioral Area: None, normal Jaw: None, normal Tongue: None,  normal,Extremity Movements Upper (arms, wrists, hands, fingers): None, normal Lower (legs, knees, ankles, toes): None, normal, Trunk Movements Neck, shoulders, hips: None, normal, Overall Severity Severity of abnormal movements (highest score from questions above): None, normal Incapacitation due to abnormal movements: None, normal Patient's awareness of abnormal movements (rate only patient's report): No Awareness, Dental Status Current problems with teeth and/or dentures?: No Does patient usually wear dentures?: No  CIWA:    COWS:    AIMS:0 Musculoskeletal: Strength & Muscle Tone: within normal limits Gait & Station: normal Patient leans: N/A  Psychiatric Specialty Exam:  Presentation  General Appearance:  Disheveled  Eye Contact: Fair  Speech: Clear and Coherent  Speech Volume: Decreased  Handedness: Right   Mood and Affect  Mood: Depressed  Affect: Congruent   Thought Process  Thought Processes: Coherent  Descriptions of Associations:Intact  Orientation:Partial  Thought Content:Logical  History of Schizophrenia/Schizoaffective disorder:Yes  Duration of Psychotic Symptoms:Greater than six months  Hallucinations:Hallucinations: None Description of Auditory Hallucinations: NA  Ideas of Reference:None  Suicidal Thoughts:Suicidal Thoughts: No SI Active Intent and/or Plan: Without Intent; Without Plan; Without Means to Carry Out; Without Access to Means SI Passive Intent and/or Plan: Without Intent; Without Plan; Without Means to Carry Out; Without Access to Means  Homicidal Thoughts:Homicidal Thoughts: No   Sensorium  Memory: Immediate Good  Judgment: Poor  Insight: Poor   Executive Functions  Concentration: Fair  Attention Span: Fair  Recall: Poor  Fund of Knowledge: Poor  Language: Poor  Psychomotor Activity  Psychomotor Activity: Psychomotor Activity: Normal  Assets  Assets: Communication Skills  Sleep   Sleep: Sleep: Good Number of Hours of Sleep: 8  Physical Exam: Physical Exam Constitutional:      Appearance: Normal appearance.  HENT:     Nose: Nose normal.  Eyes:     Pupils: Pupils are equal, round, and reactive  to light.  Pulmonary:     Effort: Pulmonary effort is normal.  Musculoskeletal:        General: Normal range of motion.     Cervical back: Normal range of motion.  Neurological:     Mental Status: She is alert and oriented to person, place, and time.    Review of Systems  Constitutional:  Negative for fever.  HENT:  Negative for hearing loss.   Eyes:  Negative for blurred vision.  Respiratory:  Negative for cough.   Cardiovascular:  Negative for chest pain.  Gastrointestinal:  Negative for heartburn.  Genitourinary:  Negative for dysuria.  Musculoskeletal:  Negative for myalgias.  Skin:  Negative for rash.  Neurological:  Negative for dizziness.  Psychiatric/Behavioral:  Positive for depression. Negative for hallucinations, memory loss, substance abuse and suicidal ideas. The patient is nervous/anxious and has insomnia.    Blood pressure (!) 86/63, pulse 100, temperature 98 F (36.7 C), temperature source Oral, resp. rate 16, height 4\' 11"  (1.499 m), weight 63 kg, SpO2 99%. Body mass index is 28.05 kg/m.  Treatment Plan Summary: Daily contact with patient to assess and evaluate symptoms and progress in treatment and Medication management.    Still waiting on safe disposition as patient would not be able to live independently given cognitive impairment. Recommend assisted living.    No medication side effects.  Because of urinary incontinence, recommended adult diapers to be worn.   Principal/active diagnoses:  Principal Problem:   Schizoaffective disorder, depressive type (HCC) Active Problems:   GERD (gastroesophageal reflux disease)   Intellectual disability   Tobacco use disorder (MOCA 16/30) moderate cognitive impairment probably related to  moderate intellectual disability.  Plan:  -Continue Vitamin D 50.000 units weekly for bone health. -Continue Sertraline150 mg po Q daily for depression/anxiety -Continue Buspar 15 mg po bid for anxiety.  -Continue paliperidone 6 mg po qd for mood stabilization -Continue Trazodone 50 mg nightly for insomnia  -Continue Nicoderm 21 mg topically Q 24 hrs for nicotine withdrawal management. -Continue vitamin B12 1000 mcg IM weekly for 4 weeks then monthly -Continue Melatonin 5 mg nightly for sleep -Continue Protonix EC 40 mg p.o. daily for GERD -Continue MiraLAX 17 g p.o. PRN for constipation -Continue hydroxyzine 25 mg p.o. 3 times daily as needed for anxiety -Continue Ensure nutritional shakes TID in between meals  -Previously discontinued Hydroxyzine 50 mg nightly-hypotension in the mornings   Safety and Monitoring: Voluntary admission to inpatient psychiatric unit for safety, stabilization and treatment Daily contact with patient to assess and evaluate symptoms and progress in treatment Patient's case to be discussed in multi-disciplinary team meeting Observation Level : q15 minute checks Vital signs: q12 hours Precautions: Safety   Discharge Planning: Social work and case management to assist with discharge planning and identification of hospital follow-up needs prior to discharge Estimated LOS: Unknown at this time. Discharge Concerns: Need to establish a safety plan; Medication compliance and effectiveness Discharge Goals: Return home with outpatient referrals for mental health follow-up including medication management/psychotherapy No legal guardian, has payee  [name], NP 02/18/2023, 2:15 PM

## 2023-02-18 NOTE — B[facility] Group Notes (Signed)
Patient did not attend the AA group.

## 2023-02-18 NOTE — Progress Notes (Signed)
   02/18/23 0900  Psych Admission Type (Psych Patients Only)  Admission Status Involuntary  Psychosocial Assessment  Patient Complaints Depression  Eye Contact Avoids  Facial Expression Flat;Sad  Affect Sad;Preoccupied  Speech Logical/coherent  Interaction Childlike  Motor Activity Slow;Tremors  Appearance/Hygiene Poor hygiene  Behavior Characteristics Cooperative;Calm  Mood Depressed  Thought Process  Coherency WDL  Content WDL  Delusions None reported or observed  Perception WDL  Hallucination None reported or observed  Judgment Poor  Confusion None  Danger to Self  Current suicidal ideation? Denies  Self-Injurious Behavior No self-injurious ideation or behavior indicators observed or expressed   Agreement Not to Harm Self Yes  Description of Agreement verbal  Danger to Others  Danger to Others None reported or observed

## 2023-02-18 NOTE — Plan of Care (Signed)
  Problem: Education: Goal: Knowledge of the prescribed therapeutic regimen will improve Outcome: Progressing   Problem: Activity: Goal: Interest or engagement in leisure activities will improve Outcome: Progressing

## 2023-02-19 DIAGNOSIS — F251 Schizoaffective disorder, depressive type: Secondary | ICD-10-CM | POA: Diagnosis not present

## 2023-02-19 NOTE — Progress Notes (Signed)
   02/19/23 0900  Psych Admission Type (Psych Patients Only)  Admission Status Involuntary  Psychosocial Assessment  Patient Complaints Depression  Eye Contact Brief  Facial Expression Sad  Affect Preoccupied;Sad  Speech Logical/coherent  Interaction Childlike  Motor Activity Slow;Tremors  Appearance/Hygiene Poor hygiene;Body odor  Behavior Characteristics Cooperative;Calm  Mood Depressed  Thought Process  Coherency WDL  Content WDL  Delusions None reported or observed  Perception WDL  Hallucination None reported or observed  Judgment Poor  Confusion None  Danger to Self  Current suicidal ideation? Denies  Self-Injurious Behavior No self-injurious ideation or behavior indicators observed or expressed   Agreement Not to Harm Self Yes  Description of Agreement verbal  Danger to Others  Danger to Others None reported or observed

## 2023-02-19 NOTE — [facility] Group Notes (Signed)
Adult Psychoeducational Group Note  Date:  02/19/2023 Time:  9:26 PM  Group Topic/Focus:  Wrap-Up Group:   The focus of this group is to help patients review their daily goal of treatment and discuss progress on daily workbooks.  Participation Level:  Active  Participation Quality:  Appropriate  Affect:  Appropriate  Cognitive:  Appropriate  Insight: Appropriate  Engagement in Group:  Engaged  Modes of Intervention:  Discussion and Support  Additional Comments:  Pt attended the evening group and responded to all questions from the Writer. Tali told that today was a good day on the unit, the highlight of which was reading a good book. On the subject of ways to stay well upon discharge, Pt mentioned wanting to find another treatment facility to go to. Pt rated her day a 7 out of 10.  Christ Kick 02/19/2023, 9:26 PM

## 2023-02-19 NOTE — Progress Notes (Signed)
[facility] MD Progress Note  02/19/2023 1:19 PM Yolanda [name]  MRN:  [id number] Principal Problem: Schizoaffective disorder, depressive type (HCC) Diagnosis: Principal Problem:   Schizoaffective disorder, depressive type (HCC) Active Problems:   GERD (gastroesophageal reflux disease)   Intellectual disability   Tobacco use disorder  Reason for admission: This is the first psychiatric admission in this [facility] in 12 years for this 47 AA female with an extensive hx of mental illnesses & probable polysubstance use disorders. She is admitted to the [facility] from the [facility] hospital with complain of worsening suicidal ideations with plan to stab herself. Per chart review, patient apparently reported at the ED that she has been depressed for a while & has not been taking her mental health medications. After medical evaluation.clearance, she was transferred to the [facility] for further psychiatric evaluation/treatments.   24 hr chart review: Sleep Hours last night: 7.75 hrs as per nursing documentation. Pt reports a good sleep quality last night. Nursing Concerns: None reported/documented Behavioral episodes in the past 24 RKY:HCWC reported or documented Medication Compliance: Compliant  Vital Signs in the past 24 hrs: WNL. PRN Medications in the past 24 hrs: None  Patient assessment note: On assessment today, the pt reports that their mood is still depressed, and is staying isolative to her room, and preferring not to interact with peers or staff or to participate in unit activities. During encounter today, writer was attempting to provide positive reinforcements for pt to get out of bed to go to the day room for unit activities, and she shouted: "I'm not going to get better!". She is hoarding food and bedding in her room, and has been educated against this. Resistant and not receptive to coming out of her room today when educated Gaffer to do so. Room is dirty, with left over food and dirty linen piled up on  the floors. Orders placed for a room sweep to be completed per shift where staff is removing all food from the room each shift and also removing all linen from the room per shift, and also encouraging patient and telling her that she is expected to go to the cafeteria for meals, and not to expect for food to be brought back to her. She is continuing to reports that anxiety is under control with current medication regimen. Reports that sleep is wnl, but is observed to be in bed all day, Reports a good appetite, and needs to be educated to go to cafeteria for meals. Concentration is good.  Energy level is average. Denies suicidal thoughts. Denies suicidal intent and plan.  Denies having any HI.  Denies having psychotic symptoms. Personal hygiene is poor, and pt is lying on her bed with no linens and looks disheveled and unkempt. Needs to be encouraged and verbal reinforcements given to shower on a daily basis.   Denies having side effects to current psychiatric medications. No TD/EPS type symptoms found on assessment, and pt denies any feelings of stiffness. AIMS: 0.  We discussed no changes to current medication regimen.   Discussed the following psychosocial stressors: Lack of a current stable & safe housing in the community. We also discussed the fact that we are continuing to await for DSS to coordinate with our CSW to locate a stable and safe home for patient in the community with a goal of continuous betterment in her mental health. CSW reports today that there is expected to be a meeting with DSS this week to discuss disposition.  Total Time spent with patient: 45 minutes  Past Psychiatric History: See H & P  Past Medical History:  Past Medical History:  Diagnosis Date   Bipolar affect, depressed (HCC)    Constipation 08/17/2022   Depression    Falls 07/21/2022   Fracture of femoral neck, right, closed (HCC) 01/17/2022   Herpes simplex 08/22/2017   Open fracture dislocation of  right elbow joint 01/17/2022    Past Surgical History:  Procedure Laterality Date   NO PAST SURGERIES     SALPINGECTOMY     Family History: History reviewed. No pertinent family history. Family Psychiatric  History: See H & P Social History:  Social History   Substance and Sexual Activity  Alcohol Use Yes     Social History   Substance and Sexual Activity  Drug Use Yes   Types: Cocaine, Marijuana    Social History   Socioeconomic History   Marital status: Single    Spouse name: Not on file   Number of children: Not on file   Years of education: Not on file   Highest education level: Not on file  Occupational History   Not on file  Tobacco Use   Smoking status: Every Day   Smokeless tobacco: Not on file  Substance and Sexual Activity   Alcohol use: Yes   Drug use: Yes    Types: Cocaine, Marijuana   Sexual activity: Yes  Other Topics Concern   Not on file  Social History Narrative   Not on file   Social Determinants of Health   Financial Resource Strain: Low Risk  (09/12/2022)   Received from [name], [facility]   Overall Financial Resource Strain (CARDIA)    Difficulty of Paying Living Expenses: Not hard at all  Food Insecurity: Patient Declined (12/20/2022)   Hunger Vital Sign    Worried About Running Out of Food in the Last Year: Patient declined    Ran Out of Food in the Last Year: Patient declined  Transportation Needs: No Transportation Needs (12/20/2022)   PRAPARE - Administrator, Civil Service (Medical): No    Lack of Transportation (Non-Medical): No  Physical Activity: Not on file  Stress: No Stress Concern Present (07/17/2022)   Received from [facility], [facility] of Occupational Health - Occupational Stress Questionnaire    Feeling of Stress : Not at all  Social Connections: Unknown (07/16/2022)   Received from [facility], [facility]   Social Network    Social Network: Not on file    Sleep:  Good  Appetite:  Good  Current Medications: Current Facility-Administered Medications  Medication Dose Route Frequency Provider Last Rate Last Admin   acetaminophen (TYLENOL) tablet 650 mg  650 mg Oral Q6H PRN [name], NP   650 mg at 02/11/23 1727   alum & mag hydroxide-simeth (MAALOX/MYLANTA) 200-200-20 MG/5ML suspension 30 mL  30 mL Oral Q4H PRN [name], NP   30 mL at 01/28/23 1530   busPIRone (BUSPAR) tablet 15 mg  15 mg Oral BID [name], NP   15 mg at 02/19/23 0750   cyanocobalamin (VITAMIN B12) injection 1,000 mcg  1,000 mcg Intramuscular Q30 days Sarita Bottom, MD   1,000 mcg at 02/02/23 1238   diphenhydrAMINE (BENADRYL) capsule 50 mg  50 mg Oral TID PRN [name], NP   50 mg at 01/15/23 1211   Or   diphenhydrAMINE (BENADRYL) injection 50 mg  50 mg Intramuscular TID PRN  [name], NP       feeding supplement (ENSURE ENLIVE / ENSURE PLUS) liquid 237 mL  237 mL Oral TID BM Bellarose Burtt, NP   237 mL at 02/19/23 1046   haloperidol (HALDOL) tablet 5 mg  5 mg Oral TID PRN [name], NP   5 mg at 01/15/23 1211   Or   haloperidol lactate (HALDOL) injection 5 mg  5 mg Intramuscular TID PRN [name], NP       hydrOXYzine (ATARAX) tablet 25 mg  25 mg Oral TID PRN [name], DO   25 mg at 02/16/23 2053   LORazepam (ATIVAN) tablet 2 mg  2 mg Oral TID PRN [name], NP   2 mg at 01/15/23 1211   Or   LORazepam (ATIVAN) injection 2 mg  2 mg Intramuscular TID PRN [name], NP       magnesium hydroxide (MILK OF MAGNESIA) suspension 30 mL  30 mL Oral Daily PRN [name], NP   30 mL at 02/15/23 1318   melatonin tablet 5 mg  5 mg Oral QHS Thao Vanover, NP   5 mg at 02/18/23 2139   nicotine (NICODERM CQ - dosed in mg/24 hours) patch 21 mg  21 mg Transdermal Daily [name], DO   21 mg at 02/19/23 0750   paliperidone (INVEGA) 24 hr tablet 6 mg  6 mg Oral Daily [name], NP   6 mg at 02/18/23 2139   pantoprazole (PROTONIX) EC tablet 40 mg   40 mg Oral Daily [name], NP   40 mg at 02/19/23 0750   polyethylene glycol (MIRALAX / GLYCOLAX) packet 17 g  17 g Oral Daily PRN [name], NP   17 g at 02/18/23 1624   sertraline (ZOLOFT) tablet 150 mg  150 mg Oral Daily Massengill, Harrold Donath, MD   150 mg at 02/19/23 0750   traZODone (DESYREL) tablet 50 mg  50 mg Oral QHS [name], NP   50 mg at 02/18/23 2138    Lab Results: No results found for this or any previous visit (from the past 48 hour(s)).  Blood Alcohol level:  Lab Results  Component Value Date   ETH <10 12/19/2022   ETH <10 11/21/2019    Metabolic Disorder Labs: Lab Results  Component Value Date   HGBA1C 4.4 (L) 12/21/2022   MPG 79.58 12/21/2022   No results found for: "PROLACTIN" Lab Results  Component Value Date   CHOL 218 (H) 12/21/2022   TRIG 81 12/21/2022   HDL 53 12/21/2022   CHOLHDL 4.1 12/21/2022   VLDL 16 12/21/2022   LDLCALC 149 (H) 12/21/2022   LDLCALC 110 (H) 03/13/2011   Physical Findings: AIMS: Facial and Oral Movements Muscles of Facial Expression: None, normal Lips and Perioral Area: None, normal Jaw: None, normal Tongue: None, normal,Extremity Movements Upper (arms, wrists, hands, fingers): None, normal Lower (legs, knees, ankles, toes): None, normal, Trunk Movements Neck, shoulders, hips: None, normal, Overall Severity Severity of abnormal movements (highest score from questions above): None, normal Incapacitation due to abnormal movements: None, normal Patient's awareness of abnormal movements (rate only patient's report): No Awareness, Dental Status Current problems with teeth and/or dentures?: No Does patient usually wear dentures?: No  CIWA:    COWS:    AIMS:0 Musculoskeletal: Strength & Muscle Tone: within normal limits Gait & Station: normal Patient leans: N/A  Psychiatric Specialty Exam:  Presentation  General Appearance:  Disheveled  Eye Contact: Minimal  Speech: Clear and  Coherent  Speech  Volume: Normal  Handedness: Right   Mood and Affect  Mood: Depressed  Affect: Congruent   Thought Process  Thought Processes: Coherent  Descriptions of Associations:Intact  Orientation:Partial  Thought Content:Logical  History of Schizophrenia/Schizoaffective disorder:Yes  Duration of Psychotic Symptoms:Greater than six months  Hallucinations:Hallucinations: None  Ideas of Reference:None  Suicidal Thoughts:Suicidal Thoughts: No  Homicidal Thoughts:Homicidal Thoughts: No   Sensorium  Memory: Immediate Good  Judgment: Poor  Insight: Poor   Executive Functions  Concentration: Poor  Attention Span: Fair  Recall: Poor  Fund of Knowledge: Poor  Language: Fair  Psychomotor Activity  Psychomotor Activity: Psychomotor Activity: Normal  Assets  Assets: Resilience  Sleep  Sleep: Sleep: Good  Physical Exam: Physical Exam Constitutional:      Appearance: Normal appearance.  HENT:     Nose: Nose normal.  Eyes:     Pupils: Pupils are equal, round, and reactive to light.  Pulmonary:     Effort: Pulmonary effort is normal.  Musculoskeletal:        General: Normal range of motion.     Cervical back: Normal range of motion.  Neurological:     Mental Status: She is alert and oriented to person, place, and time.    Review of Systems  Constitutional:  Negative for fever.  HENT:  Negative for hearing loss.   Eyes:  Negative for blurred vision.  Respiratory:  Negative for cough.   Cardiovascular:  Negative for chest pain.  Gastrointestinal:  Negative for heartburn.  Genitourinary:  Negative for dysuria.  Musculoskeletal:  Negative for myalgias.  Skin:  Negative for rash.  Neurological:  Negative for dizziness.  Psychiatric/Behavioral:  Positive for depression. Negative for hallucinations, memory loss, substance abuse and suicidal ideas. The patient is nervous/anxious and has insomnia.    Blood pressure 102/65, pulse 65, temperature  98.1 F (36.7 C), temperature source Oral, resp. rate 17, height 4\' 11"  (1.499 m), weight 63 kg, SpO2 95%. Body mass index is 28.05 kg/m.  Treatment Plan Summary: Daily contact with patient to assess and evaluate symptoms and progress in treatment and Medication management.    Still waiting on safe disposition as patient would not be able to live independently given cognitive impairment. Recommend assisted living.    No medication side effects.  Because of urinary incontinence, recommended adult diapers to be worn.   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  Plan:  -Continue Vitamin D 50.000 units weekly for bone health. -Continue Sertraline150 mg po Q daily for depression/anxiety -Continue Buspar 15 mg po bid for anxiety.  -Continue paliperidone 6 mg po qd for mood stabilization -Continue Trazodone 50 mg nightly for insomnia  -Continue Nicoderm 21 mg topically Q 24 hrs for nicotine withdrawal management. -Continue vitamin B12 1000 mcg IM weekly for 4 weeks then monthly -Continue Melatonin 5 mg nightly for sleep -Continue Protonix EC 40 mg p.o. daily for GERD -Continue MiraLAX 17 g p.o. PRN for constipation -Continue hydroxyzine 25 mg p.o. 3 times daily as needed for anxiety -Continue Ensure nutritional shakes TID in between meals  -Previously discontinued Hydroxyzine 50 mg nightly-hypotension in the mornings   Safety and Monitoring: Voluntary admission to inpatient psychiatric unit for safety, stabilization and treatment Daily contact with patient to assess and evaluate symptoms and progress in treatment Patient's  case to be discussed in multi-disciplinary team meeting Observation Level : q15 minute checks Vital signs:  q12 hours Precautions: Safety   Discharge Planning: Social work and case management to assist with discharge planning and identification of hospital follow-up needs prior to discharge Estimated LOS: Unknown at this time. Discharge Concerns: Need to establish a safety plan; Medication compliance and effectiveness Discharge Goals: Return home with outpatient referrals for mental health follow-up including medication management/psychotherapy No legal guardian, has payee  [name], NP 02/19/2023, 1:19 PM Patient ID: Yolanda [name], female   DOB: Aug 13, 1975, 47 y.o.   MRN: [id number]

## 2023-02-19 NOTE — Progress Notes (Signed)
   02/18/23 2110  Psych Admission Type (Psych Patients Only)  Admission Status Involuntary  Psychosocial Assessment  Patient Complaints Depression;Anxiety;Hopelessness  Eye Contact Avoids  Facial Expression Flat;Sad  Affect Preoccupied  Speech Logical/coherent  Interaction Childlike  Motor Activity Slow  Appearance/Hygiene Body odor;Poor hygiene  Behavior Characteristics Cooperative;Calm  Mood Depressed  Thought Process  Coherency WDL  Content WDL  Delusions None reported or observed  Perception WDL  Hallucination None reported or observed  Judgment Poor  Confusion None  Danger to Self  Current suicidal ideation? Denies  Self-Injurious Behavior No self-injurious ideation or behavior indicators observed or expressed   Agreement Not to Harm Self Yes  Description of Agreement verbal  Danger to Others  Danger to Others None reported or observed

## 2023-02-19 NOTE — BHH Counselor (Signed)
CSW spoke with DSS Worker Thamas Jaegers who provided an update concerning pt's placement following discharge. Ms. [name] reports that the pt was referred to the "[facility]" for consideration of placement and was deemed inappropriate for this agency due to pt being assessed and not meeting criteria for IDD/DD. The agency recommended that the pt be assessed for a Mental Health Diagnosis and suggested [facility] agency for a placement and DSS worker  is in the  currently in the process of acquiring a date. Will continue to monitor.

## 2023-02-19 NOTE — Group Note (Signed)
LCSW Group Therapy Note  Group Date: 02/19/2023 Start Time: 1100 End Time: 1200   Type of Therapy and Topic:  Group Therapy - Healthy vs Unhealthy Coping Skills  Participation Level:  Did Not Attend   Description of Group The focus of this group was to determine what unhealthy coping techniques typically are used by group members and what healthy coping techniques would be helpful in coping with various problems. Patients were guided in becoming aware of the differences between healthy and unhealthy coping techniques. Patients were asked to identify 2-3 healthy coping skills they would like to learn to use more effectively.   Summary of Patient Progress: Did not attend  Therapeutic Modalities Cognitive Behavioral Therapy Motivational Interviewing  Ane Payment, LCSW 02/19/2023  2:20 PM

## 2023-02-19 NOTE — Plan of Care (Signed)
  Problem: Education: Goal: Utilization of techniques to improve thought processes will improve Outcome: Progressing   Problem: Activity: Goal: Interest or engagement in leisure activities will improve Outcome: Progressing   Problem: Coping: Goal: Coping ability will improve Outcome: Progressing   Problem: Coping: Goal: Will verbalize feelings Outcome: Progressing   Problem: Health Behavior/Discharge Planning: Goal: Compliance with therapeutic regimen will improve Outcome: Progressing   Problem: Safety: Goal: Ability to disclose and discuss suicidal ideas will improve Outcome: Progressing   Problem: Self-Concept: Goal: Level of anxiety will decrease Outcome: Progressing   Problem: Education: Goal: Knowledge of the prescribed therapeutic regimen will improve Outcome: Progressing

## 2023-02-19 NOTE — Progress Notes (Signed)
   02/19/23 2300  Psych Admission Type (Psych Patients Only)  Admission Status Involuntary  Psychosocial Assessment  Patient Complaints Anxiety;Depression;Hopelessness;Crying spells  Eye Contact Avoids  Facial Expression Sad  Affect Preoccupied  Speech Logical/coherent  Interaction Childlike  Motor Activity Slow  Appearance/Hygiene Body odor;Poor hygiene  Behavior Characteristics Cooperative;Appropriate to situation  Mood Depressed  Thought Process  Coherency WDL  Content WDL  Delusions None reported or observed  Perception WDL  Hallucination None reported or observed  Judgment Poor  Confusion None  Danger to Self  Current suicidal ideation? Denies  Self-Injurious Behavior No self-injurious ideation or behavior indicators observed or expressed   Agreement Not to Harm Self Yes  Description of Agreement verbal  Danger to Others  Danger to Others None reported or observed

## 2023-02-19 NOTE — Group Note (Signed)
Recreation Therapy Group Note   Group Topic:Animal Assisted Therapy   Group Date: 02/19/2023 Start Time: 1000 End Time: 1030 Facilitators: Jodi Criscuolo, Benito Mccreedy, LRT   Animal-Assisted Activity (AAA) Program Checklist/Progress Note Patient Eligibility Criteria Checklist & Daily Group note for Rec Tx Intervention   AAA/T Program Assumption of Risk Form signed by Patient/ or Parent Legal Guardian YES  Patient is free of allergies or severe asthma  YES  Patient reports no fear of animals YES  Patient reports no history of cruelty to animals YES  Patient understands their participation is voluntary YES   Group Description: Patients provided opportunity to interact with trained and credentialed [facility] Therapy dog and the community volunteer/dog handler.    Affect/Mood: N/A   Participation Level: Did not attend    Clinical Observations/Individualized Feedback: Pt did not join group session following invitation to open dayroom for AAA programming.   Benito Mccreedy Yolanda [name], LRT, CTRS 02/19/2023 2:43 PM

## 2023-02-19 NOTE — Progress Notes (Signed)
   02/19/23 0630  15 Minute Checks  Location Bedroom  Visual Appearance Calm  Behavior Sleeping  Sleep (Behavioral Health Patients Only)  Calculate sleep? (Click Yes once per 24 hr at 0600 safety check) Yes  Documented sleep last 24 hours 7.75

## 2023-02-19 NOTE — Plan of Care (Signed)
  Problem: Education: Goal: Utilization of techniques to improve thought processes will improve Outcome: Progressing Goal: Knowledge of the prescribed therapeutic regimen will improve Outcome: Progressing   Problem: Activity: Goal: Interest or engagement in leisure activities will improve Outcome: Progressing Goal: Imbalance in normal sleep/wake cycle will improve Outcome: Progressing   Problem: Coping: Goal: Coping ability will improve Outcome: Progressing Goal: Will verbalize feelings Outcome: Progressing   Problem: Health Behavior/Discharge Planning: Goal: Ability to make decisions will improve Outcome: Progressing

## 2023-02-20 DIAGNOSIS — F251 Schizoaffective disorder, depressive type: Secondary | ICD-10-CM | POA: Diagnosis not present

## 2023-02-20 NOTE — BHH Group Notes (Signed)
Adult Psychoeducational Group Note  Date:  02/20/2023 Time:  10:14 PM  Group Topic/Focus:  Wrap-Up Group:   The focus of this group is to help patients review their daily goal of treatment and discuss progress on daily workbooks.  Participation Level:  Active  Participation Quality:  Attentive  Affect:  Appropriate  Cognitive:  Alert  Insight: Improving  Engagement in Group:  Engaged  Modes of Intervention:  Discussion  Additional Comments:  Pt attended and participated in group.  Maura Crandall Cassandra 02/20/2023, 10:14 PM

## 2023-02-20 NOTE — [facility] Group Notes (Signed)
[facility] Group Notes:  (Nursing/MHT/Case Management/Adjunct)  Date:  02/20/2023  Time:  10:57 AM  Type of Therapy:  Group Topic/ Focus: Goals Group: The focus of this group is to help patients establish daily goals to achieve during treatment and discuss how the patient can incorporate goal setting into their daily lives to aide in recovery.   Participation Level:  Active  Participation Quality:  Appropriate  Affect:  Appropriate  Cognitive:  Appropriate  Insight:  Appropriate  Engagement in Group:  Engaged  Modes of Intervention:  Discussion  Summary of Progress/Problems:  Patient attended and participated goals group today. Patient's goal for today is to get in touch with the SW and go to groups.  Yolanda [name] 02/20/2023, 10:57 AM

## 2023-02-20 NOTE — Progress Notes (Signed)
   02/20/23 2100  Psych Admission Type (Psych Patients Only)  Admission Status Involuntary  Psychosocial Assessment  Patient Complaints Anxiety;Depression;Hopelessness  Eye Contact Brief  Facial Expression Sad  Affect Depressed;Sad  Speech Slow  Interaction Childlike  Motor Activity Slow;Unsteady;Tremors  Appearance/Hygiene Unremarkable  Behavior Characteristics Cooperative;Calm  Mood Depressed;Sad  Thought Process  Coherency WDL  Content WDL  Delusions None reported or observed  Perception WDL  Hallucination None reported or observed  Judgment Poor  Confusion None  Danger to Self  Current suicidal ideation? Denies  Self-Injurious Behavior No self-injurious ideation or behavior indicators observed or expressed   Agreement Not to Harm Self Yes  Description of Agreement Verbal  Danger to Others  Danger to Others None reported or observed

## 2023-02-20 NOTE — Progress Notes (Signed)
   02/20/23 0900  Psych Admission Type (Psych Patients Only)  Admission Status Involuntary  Psychosocial Assessment  Patient Complaints Depression;Hopelessness;Helplessness  Eye Contact Brief  Facial Expression Sad  Affect Sad  Speech Logical/coherent  Interaction Childlike  Motor Activity Unsteady;Tremors;Slow  Appearance/Hygiene Unremarkable  Behavior Characteristics Cooperative;Calm  Mood Depressed;Sad  Thought Process  Coherency WDL  Content WDL  Delusions None reported or observed  Perception WDL  Hallucination None reported or observed  Judgment Poor  Confusion None  Danger to Self  Current suicidal ideation? Denies  Self-Injurious Behavior No self-injurious ideation or behavior indicators observed or expressed   Agreement Not to Harm Self Yes  Description of Agreement verbal  Danger to Others  Danger to Others None reported or observed

## 2023-02-20 NOTE — Plan of Care (Signed)
  Problem: Education: Goal: Knowledge of the prescribed therapeutic regimen will improve Outcome: Progressing   Problem: Activity: Goal: Interest or engagement in leisure activities will improve Outcome: Progressing Goal: Imbalance in normal sleep/wake cycle will improve Outcome: Progressing   Problem: Coping: Goal: Coping ability will improve Outcome: Progressing Goal: Will verbalize feelings Outcome: Progressing   Problem: Health Behavior/Discharge Planning: Goal: Ability to make decisions will improve Outcome: Progressing Goal: Compliance with therapeutic regimen will improve Outcome: Progressing

## 2023-02-20 NOTE — Plan of Care (Signed)
  Problem: Education: Goal: Knowledge of the prescribed therapeutic regimen will improve Outcome: Progressing   Problem: Activity: Goal: Interest or engagement in leisure activities will improve Outcome: Progressing   Problem: Coping: Goal: Coping ability will improve Outcome: Progressing   Problem: Coping: Goal: Will verbalize feelings Outcome: Progressing   Problem: Safety: Goal: Ability to disclose and discuss suicidal ideas will improve Outcome: Progressing   Problem: Self-Concept: Goal: Level of anxiety will decrease Outcome: Progressing

## 2023-02-20 NOTE — Progress Notes (Signed)
[facility] MD Progress Note  02/20/2023 2:25 PM Yolanda [name]  MRN:  [id number] Principal Problem: Schizoaffective disorder, depressive type (HCC) Diagnosis: Principal Problem:   Schizoaffective disorder, depressive type (HCC) Active Problems:   GERD (gastroesophageal reflux disease)   Intellectual disability   Tobacco use disorder  Reason for admission: This is the first psychiatric admission in this [facility] in 12 years for this 47 AA female with an extensive hx of mental illnesses & probable polysubstance use disorders. She is admitted to the [facility] from the [facility] with complain of worsening suicidal ideations with plan to stab herself. Per chart review, patient apparently reported at the ED that she has been depressed for a while & has not been taking her mental health medications. After medical evaluation.clearance, she was transferred to the [facility] for further psychiatric evaluation/treatments.   Daily notes: Yolanda [name] is seen this morning. Chart reviewed. The chart findings discussed with the treatment team. She presents alert, oriented to self, place & situation. She is visible on the unit, attending group sessions. She presents tearful this morning. She reports, "I'm feeling very bad today. I did not read my bible like I should for two days. When I tried to read, I did not read it well. I don't want God to be mad at me because I need him. I went to the groups this morning. We are watching the movie blind sight. I love that movie. That is one of my favorite movies". After talking about feeling upset & disappointed for not reading her bible like she should for two days, Yolanda [name] becomes cheerful again. She was informed that she could start reading her bible again when ever she wants for God is not mad at her. She is encouraged to continue to attend groups sessions. She is encouraged to continue to take her medications as recorded. She currently denies any SIHI, AVH, delusional thoughts or paranoia.  Yolanda [name] does not appear to be responding to any internal stimuli. She denies any side effects to her medications, Yolanda [name] has been a patient in this hospital for a while now. She came to the hospital she was homeless & still does no have a place to live after discharge. Her case is currently with the DSS. They are trying to find Yolanda [name] a suitable place to live after discharge. Patient remains hopeful to hear about the much anticipated good news of having a place to call home. Discussed this case with the attending psychiatrist. There are no changes made on her current plan of care. Her vital signs remain stable. Will continue as already in progress. Patient remains in no apparent distress.     Total Time spent with patient: 45 minutes  Past Psychiatric History: See H & P  Past Medical History:  Past Medical History:  Diagnosis Date   Bipolar affect, depressed (HCC)    Constipation 08/17/2022   Depression    Falls 07/21/2022   Fracture of femoral neck, right, closed (HCC) 01/17/2022   Herpes simplex 08/22/2017   Open fracture dislocation of right elbow joint 01/17/2022    Past Surgical History:  Procedure Laterality Date   NO PAST SURGERIES     SALPINGECTOMY     Family History: History reviewed. No pertinent family history.  Family Psychiatric  History: See H & P  Social History:  Social History   Substance and Sexual Activity  Alcohol Use Yes     Social History   Substance and Sexual Activity  Drug Use Yes  Types: Cocaine, Marijuana    Social History   Socioeconomic History   Marital status: Single    Spouse name: Not on file   Number of children: Not on file   Years of education: Not on file   Highest education level: Not on file  Occupational History   Not on file  Tobacco Use   Smoking status: Every Day   Smokeless tobacco: Not on file  Substance and Sexual Activity   Alcohol use: Yes   Drug use: Yes    Types: Cocaine, Marijuana   Sexual activity: Yes   Other Topics Concern   Not on file  Social History Narrative   Not on file   Social Determinants of Health   Financial Resource Strain: Low Risk  (09/12/2022)   Received from [facility], [facility]   Overall Financial Resource Strain (CARDIA)    Difficulty of Paying Living Expenses: Not hard at all  Food Insecurity: Patient Declined (12/20/2022)   Hunger Vital Sign    Worried About Running Out of Food in the Last Year: Patient declined    Ran Out of Food in the Last Year: Patient declined  Transportation Needs: No Transportation Needs (12/20/2022)   PRAPARE - Administrator, Civil Service (Medical): No    Lack of Transportation (Non-Medical): No  Physical Activity: Not on file  Stress: No Stress Concern Present (07/17/2022)   Received from [facility], [facility] - Occupational Stress Questionnaire    Feeling of Stress : Not at all  Social Connections: Unknown (07/16/2022)   Received from [facility], [facility]   Social Network    Social Network: Not on file   Sleep: Good  Appetite:  Good  Current Medications: Current Facility-Administered Medications  Medication Dose Route Frequency Provider Last Rate Last Admin   acetaminophen (TYLENOL) tablet 650 mg  650 mg Oral Q6H PRN [name], NP   650 mg at 02/11/23 1727   alum & mag hydroxide-simeth (MAALOX/MYLANTA) 200-200-20 MG/5ML suspension 30 mL  30 mL Oral Q4H PRN [name], NP   30 mL at 01/28/23 1530   busPIRone (BUSPAR) tablet 15 mg  15 mg Oral BID [name], NP   15 mg at 02/20/23 0827   cyanocobalamin (VITAMIN B12) injection 1,000 mcg  1,000 mcg Intramuscular Q30 days [name], Nadir, MD   1,000 mcg at 02/02/23 1238   diphenhydrAMINE (BENADRYL) capsule 50 mg  50 mg Oral TID PRN [name], NP   50 mg at 01/15/23 1211   Or   diphenhydrAMINE (BENADRYL) injection 50 mg  50 mg Intramuscular TID PRN [name], NP       feeding supplement  (ENSURE ENLIVE / ENSURE PLUS) liquid 237 mL  237 mL Oral TID BM Nkwenti, Doris, NP   237 mL at 02/20/23 1408   haloperidol (HALDOL) tablet 5 mg  5 mg Oral TID PRN [name], NP   5 mg at 01/15/23 1211   Or   haloperidol lactate (HALDOL) injection 5 mg  5 mg Intramuscular TID PRN [name], NP       hydrOXYzine (ATARAX) tablet 25 mg  25 mg Oral TID PRN [name], DO   25 mg at 02/16/23 2053   LORazepam (ATIVAN) tablet 2 mg  2 mg Oral TID PRN [name], NP   2 mg at 01/15/23 1211   Or   LORazepam (ATIVAN) injection 2 mg  2 mg Intramuscular  TID PRN [name], NP       magnesium hydroxide (MILK OF MAGNESIA) suspension 30 mL  30 mL Oral Daily PRN [name], NP   30 mL at 02/15/23 1318   melatonin tablet 5 mg  5 mg Oral QHS Nkwenti, Doris, NP   5 mg at 02/19/23 2129   nicotine (NICODERM CQ - dosed in mg/24 hours) patch 21 mg  21 mg Transdermal Daily [name], DO   21 mg at 02/20/23 1610   paliperidone (INVEGA) 24 hr tablet 6 mg  6 mg Oral Daily [name], NP   6 mg at 02/19/23 2129   pantoprazole (PROTONIX) EC tablet 40 mg  40 mg Oral Daily [name], NP   40 mg at 02/20/23 9604   polyethylene glycol (MIRALAX / GLYCOLAX) packet 17 g  17 g Oral Daily PRN [name], NP   17 g at 02/18/23 1624   sertraline (ZOLOFT) tablet 150 mg  150 mg Oral Daily Massengill, Harrold Donath, MD   150 mg at 02/20/23 0827   traZODone (DESYREL) tablet 50 mg  50 mg Oral QHS [name], NP   50 mg at 02/19/23 2129   Lab Results: No results found for this or any previous visit (from the past 48 hour(s)).  Blood Alcohol level:  Lab Results  Component Value Date   ETH <10 12/19/2022   ETH <10 11/21/2019   Metabolic Disorder Labs: Lab Results  Component Value Date   HGBA1C 4.4 (L) 12/21/2022   MPG 79.58 12/21/2022   No results found for: "PROLACTIN" Lab Results  Component Value Date   CHOL 218 (H) 12/21/2022   TRIG 81 12/21/2022   HDL 53 12/21/2022   CHOLHDL 4.1 12/21/2022    VLDL 16 12/21/2022   LDLCALC 149 (H) 12/21/2022   LDLCALC 110 (H) 03/13/2011   Physical Findings: AIMS: Facial and Oral Movements Muscles of Facial Expression: None, normal Lips and Perioral Area: None, normal Jaw: None, normal Tongue: None, normal,Extremity Movements Upper (arms, wrists, hands, fingers): None, normal Lower (legs, knees, ankles, toes): None, normal, Trunk Movements Neck, shoulders, hips: None, normal, Overall Severity Severity of abnormal movements (highest score from questions above): None, normal Incapacitation due to abnormal movements: None, normal Patient's awareness of abnormal movements (rate only patient's report): No Awareness, Dental Status Current problems with teeth and/or dentures?: No Does patient usually wear dentures?: No  CIWA:    COWS:    AIMS:0 Musculoskeletal: Strength & Muscle Tone: within normal limits Gait & Station: normal Patient leans: N/A  Psychiatric Specialty Exam:  Presentation  General Appearance:  Disheveled  Eye Contact: Minimal  Speech: Clear and Coherent  Speech Volume: Normal  Handedness: Right   Mood and Affect  Mood: Depressed  Affect: Congruent   Thought Process  Thought Processes: Coherent  Descriptions of Associations:Intact  Orientation:Partial  Thought Content:Logical  History of Schizophrenia/Schizoaffective disorder:Yes  Duration of Psychotic Symptoms:Greater than six months  Hallucinations:Hallucinations: None  Ideas of Reference:None  Suicidal Thoughts:Suicidal Thoughts: No  Homicidal Thoughts:Homicidal Thoughts: No   Sensorium  Memory: Immediate Good  Judgment: Poor  Insight: Poor   Executive Functions  Concentration: Poor  Attention Span: Fair  Recall: Poor  Fund of Knowledge: Poor  Language: Fair  Psychomotor Activity  Psychomotor Activity: Psychomotor Activity: Normal  Assets  Assets: Resilience  Sleep  Sleep: Sleep: Good  Physical  Exam: Physical Exam Constitutional:      Appearance: Normal appearance.  HENT:     Nose: Nose normal.  Eyes:     Pupils: Pupils are equal, round, and reactive to light.  Pulmonary:     Effort: Pulmonary effort is normal.  Genitourinary:    Comments: Deferred Musculoskeletal:        General: Normal range of motion.     Cervical back: Normal range of motion.  Neurological:     Mental Status: She is alert and oriented to person, place, and time.    Review of Systems  Constitutional:  Negative for fever.  HENT:  Negative for hearing loss.   Eyes:  Negative for blurred vision.  Respiratory:  Negative for cough.   Cardiovascular:  Negative for chest pain.  Gastrointestinal:  Negative for heartburn.  Genitourinary:  Negative for dysuria.  Musculoskeletal:  Negative for myalgias.  Skin:  Negative for rash.  Neurological:  Negative for dizziness.  Psychiatric/Behavioral:  Positive for depression. Negative for hallucinations, memory loss, substance abuse and suicidal ideas. The patient is nervous/anxious and has insomnia.    Blood pressure 91/67, pulse 65, temperature 98.1 F (36.7 C), temperature source Oral, resp. rate 17, height 4\' 11"  (1.499 m), weight 63 kg, SpO2 95%. Body mass index is 28.05 kg/m.  Treatment Plan Summary: Daily contact with patient to assess and evaluate symptoms and progress in treatment and Medication management.    Still waiting on safe disposition as patient would not be able to live independently given cognitive impairment. Recommend assisted living.    No medication side effects.  Because of urinary incontinence, recommended adult diapers to be worn.   Principal/active diagnoses:  Principal Problem:   Schizoaffective disorder, depressive type (HCC) Active Problems:   GERD (gastroesophageal reflux disease)   Intellectual disability   Tobacco use disorder (MOCA 16/30) moderate cognitive impairment probably related to moderate intellectual  disability.  Plan:  -Continue Vitamin D 50.000 units weekly for bone health. -Continue Sertraline150 mg po Q daily for depression/anxiety -Continue Buspar 15 mg po bid for anxiety.  -Continue paliperidone 6 mg po qd for mood stabilization -Continue Trazodone 50 mg nightly for insomnia  -Continue Nicoderm 21 mg topically Q 24 hrs for nicotine withdrawal management. -Continue vitamin B12 1000 mcg IM weekly for 4 weeks then monthly -Continue Melatonin 5 mg nightly for sleep -Continue Protonix EC 40 mg p.o. daily for GERD -Continue MiraLAX 17 g p.o. PRN for constipation -Continue hydroxyzine 25 mg p.o. 3 times daily as needed for anxiety -Continue Ensure nutritional shakes TID in between meals  -Previously discontinued Hydroxyzine 50 mg nightly-hypotension in the mornings   Safety and Monitoring: Voluntary admission to inpatient psychiatric unit for safety, stabilization and treatment Daily contact with patient to assess and evaluate symptoms and progress in treatment Patient's case to be discussed in multi-disciplinary team meeting Observation Level : q15 minute checks Vital signs: q12 hours Precautions: Safety   Discharge Planning: Social work and case management to assist with discharge planning and identification of hospital follow-up needs prior to discharge Estimated LOS: Unknown at this time. Discharge Concerns: Need to establish a safety plan; Medication compliance and effectiveness Discharge Goals: Return home with outpatient referrals for mental health follow-up including medication management/psychotherapy No legal guardian, has payee  Armandina Stammer, NP 02/20/2023, 2:25 PM Patient ID: Yolanda [name], female   DOB: 08/02/1975, 47 y.o.   MRN: [id number] Patient ID: Yolanda [name], female   DOB: 04-24-1976, 47 y.o.   MRN: [id number]

## 2023-02-21 DIAGNOSIS — F251 Schizoaffective disorder, depressive type: Secondary | ICD-10-CM | POA: Diagnosis not present

## 2023-02-21 NOTE — Progress Notes (Signed)
[facility] MD Progress Note  02/21/2023 10:53 AM Yolanda [name]  MRN:  [id number] Principal Problem: Schizoaffective disorder, depressive type (HCC) Diagnosis: Principal Problem:   Schizoaffective disorder, depressive type (HCC) Active Problems:   GERD (gastroesophageal reflux disease)   Intellectual disability   Tobacco use disorder  Reason for admission: This is the first psychiatric admission in this [facility] in 12 years for this 47 AA female with an extensive hx of mental illnesses & probable polysubstance use disorders. She is admitted to the [facility] from the [facility] hospital with complain of worsening suicidal ideations with plan to stab herself. Per chart review, patient apparently reported at the ED that she has been depressed for a while & has not been taking her mental health medications. After medical evaluation.clearance, she was transferred to the [facility] for further psychiatric evaluation/treatments.   Daily notes: Yolanda [name] is seen in her room this morning. She was lying down in bed. She presents alert, oriented & aware of situation. Patient is visible on the unit, attending group sessions. Requires reminders & encouragement from time to time to come out of her room to attend group sessions. She is fairly groomed. Patient is taking & tolerating her treatment regimen. Denies any side effects. During this evaluation, Yolanda [name] reports, "I'm doing alright. I'm fixing to get dressed to come out of my room. Please, can I have my 49 year old grandson visit me here? I miss him". Yolanda [name] continues to do well on her current plan of care without any side effects. She currently denies any SIHI, AVH, delusional thoughts or paranoia. Yolanda [name] does not appear to be responding to any internal stimuli. She denies any side effects to her medications, Yolanda [name] has been a patient in this hospital for a while now. When she came to the hospital, she was homeless & still does no have a place to live after discharge. Her case  is currently with the DSS. They are trying to find Shamell a suitable place to live after discharge. And because of her physical/intellectual limitations, patient will not be able to survive on the streets or the homeless shelters if discharged as she is at this time. Patient remains hopeful to hear about the much anticipated good news of having a place to call home after discharge. Yolanda [name] is also asking if her 49 year old son can visit her here in the hospital? She was informed that, her request to have her 49 year old grandson visit her here will not be granted because the child is too young for the age limit allowed to visit. She wonders if she could do a face-time with her grandson. She was informed that this provider will find out & will let her know. Discussed this case with the attending psychiatrist. There are no changes made on her current plan of care. Her vital signs remain stable. Will continue as already in progress. Patient remains in no apparent distress.     Total Time spent with patient:  35 minutes  Past Psychiatric History: See H & P  Past Medical History:  Past Medical History:  Diagnosis Date   Bipolar affect, depressed (HCC)    Constipation 08/17/2022   Depression    Falls 07/21/2022   Fracture of femoral neck, right, closed (HCC) 01/17/2022   Herpes simplex 08/22/2017   Open fracture dislocation of right elbow joint 01/17/2022    Past Surgical History:  Procedure Laterality Date   NO PAST SURGERIES     SALPINGECTOMY  Family History: History reviewed. No pertinent family history.  Family Psychiatric  History: See H & P  Social History:  Social History   Substance and Sexual Activity  Alcohol Use Yes     Social History   Substance and Sexual Activity  Drug Use Yes   Types: Cocaine, Marijuana    Social History   Socioeconomic History   Marital status: Single    Spouse name: Not on file   Number of children: Not on file   Years of education: Not on  file   Highest education level: Not on file  Occupational History   Not on file  Tobacco Use   Smoking status: Every Day   Smokeless tobacco: Not on file  Substance and Sexual Activity   Alcohol use: Yes   Drug use: Yes    Types: Cocaine, Marijuana   Sexual activity: Yes  Other Topics Concern   Not on file  Social History Narrative   Not on file   Social Determinants of Health   Financial Resource Strain: Low Risk  (09/12/2022)   Received from [facility], [facility]   Overall Financial Resource Strain (CARDIA)    Difficulty of Paying Living Expenses: Not hard at all  Food Insecurity: Patient Declined (12/20/2022)   Hunger Vital Sign    Worried About Running Out of Food in the Last Year: Patient declined    Ran Out of Food in the Last Year: Patient declined  Transportation Needs: No Transportation Needs (12/20/2022)   PRAPARE - Administrator, Civil Service (Medical): No    Lack of Transportation (Non-Medical): No  Physical Activity: Not on file  Stress: No Stress Concern Present (07/17/2022)   Received from [facility], [facility] of Occupational Health - Occupational Stress Questionnaire    Feeling of Stress : Not at all  Social Connections: Unknown (07/16/2022)   Received from [facility], [facility]   Social Network    Social Network: Not on file   Sleep: Good  Appetite:  Good  Current Medications: Current Facility-Administered Medications  Medication Dose Route Frequency Provider Last Rate Last Admin   acetaminophen (TYLENOL) tablet 650 mg  650 mg Oral Q6H PRN [name], NP   650 mg at 02/11/23 1727   alum & mag hydroxide-simeth (MAALOX/MYLANTA) 200-200-20 MG/5ML suspension 30 mL  30 mL Oral Q4H PRN [name], NP   30 mL at 01/28/23 1530   busPIRone (BUSPAR) tablet 15 mg  15 mg Oral BID [name], NP   15 mg at 02/21/23 0834   cyanocobalamin (VITAMIN B12) injection 1,000 mcg  1,000 mcg Intramuscular Q30 days  [name], Nadir, MD   1,000 mcg at 02/02/23 1238   diphenhydrAMINE (BENADRYL) capsule 50 mg  50 mg Oral TID PRN [name], NP   50 mg at 01/15/23 1211   Or   diphenhydrAMINE (BENADRYL) injection 50 mg  50 mg Intramuscular TID PRN [name], NP       feeding supplement (ENSURE ENLIVE / ENSURE PLUS) liquid 237 mL  237 mL Oral TID BM Nkwenti, Doris, NP   237 mL at 02/21/23 0929   haloperidol (HALDOL) tablet 5 mg  5 mg Oral TID PRN [name], NP   5 mg at 01/15/23 1211   Or   haloperidol lactate (HALDOL) injection 5 mg  5 mg Intramuscular TID PRN [name], NP       hydrOXYzine (ATARAX) tablet 25 mg  25  mg Oral TID PRN [name], DO   25 mg at 02/20/23 2156   LORazepam (ATIVAN) tablet 2 mg  2 mg Oral TID PRN [name], NP   2 mg at 01/15/23 1211   Or   LORazepam (ATIVAN) injection 2 mg  2 mg Intramuscular TID PRN [name], NP       magnesium hydroxide (MILK OF MAGNESIA) suspension 30 mL  30 mL Oral Daily PRN [name], NP   30 mL at 02/15/23 1318   melatonin tablet 5 mg  5 mg Oral QHS Nkwenti, Doris, NP   5 mg at 02/20/23 2156   nicotine (NICODERM CQ - dosed in mg/24 hours) patch 21 mg  21 mg Transdermal Daily [name], DO   21 mg at 02/21/23 0836   paliperidone (INVEGA) 24 hr tablet 6 mg  6 mg Oral Daily [name], NP   6 mg at 02/20/23 2155   pantoprazole (PROTONIX) EC tablet 40 mg  40 mg Oral Daily Karlita Lichtman, Nicole Kindred I, NP   40 mg at 02/21/23 0900   polyethylene glycol (MIRALAX / GLYCOLAX) packet 17 g  17 g Oral Daily PRN [name], NP   17 g at 02/18/23 1624   sertraline (ZOLOFT) tablet 150 mg  150 mg Oral Daily Massengill, Harrold Donath, MD   150 mg at 02/21/23 0900   traZODone (DESYREL) tablet 50 mg  50 mg Oral QHS [name], NP   50 mg at 02/20/23 2156   Lab Results: No results found for this or any previous visit (from the past 48 hour(s)).  Blood Alcohol level:  Lab Results  Component Value Date   ETH <10 12/19/2022   ETH <10 11/21/2019    Metabolic Disorder Labs: Lab Results  Component Value Date   HGBA1C 4.4 (L) 12/21/2022   MPG 79.58 12/21/2022   No results found for: "PROLACTIN" Lab Results  Component Value Date   CHOL 218 (H) 12/21/2022   TRIG 81 12/21/2022   HDL 53 12/21/2022   CHOLHDL 4.1 12/21/2022   VLDL 16 12/21/2022   LDLCALC 149 (H) 12/21/2022   LDLCALC 110 (H) 03/13/2011   Physical Findings: AIMS: Facial and Oral Movements Muscles of Facial Expression: None, normal Lips and Perioral Area: None, normal Jaw: None, normal Tongue: None, normal,Extremity Movements Upper (arms, wrists, hands, fingers): None, normal Lower (legs, knees, ankles, toes): None, normal, Trunk Movements Neck, shoulders, hips: None, normal, Overall Severity Severity of abnormal movements (highest score from questions above): None, normal Incapacitation due to abnormal movements: None, normal Patient's awareness of abnormal movements (rate only patient's report): No Awareness, Dental Status Current problems with teeth and/or dentures?: No Does patient usually wear dentures?: No  CIWA:    COWS:    AIMS:0 Musculoskeletal: Strength & Muscle Tone: within normal limits Gait & Station: normal Patient leans: N/A  Psychiatric Specialty Exam:  Presentation  General Appearance:  Casual; Fairly Groomed  Eye Contact: Fair  Speech: Garbled (patient is able to make needs known.)  Speech Volume: Decreased  Handedness: Right  Mood and Affect  Mood: -- (Hopeful)  Affect: Congruent  Thought Process  Thought Processes: Coherent; Goal Directed; Linear  Descriptions of Associations:Intact  Orientation:Full (Time, Place and Person)  Thought Content:Logical  History of Schizophrenia/Schizoaffective disorder:Yes  Duration of Psychotic Symptoms:Greater than six months  Hallucinations:Hallucinations: None Description of Auditory Hallucinations: NA  Ideas of Reference:None  Suicidal Thoughts:Suicidal Thoughts:  No SI Active Intent and/or Plan: Without Intent; Without Plan; Without Means to Carry Out; Without  Access to Means SI Passive Intent and/or Plan: Without Intent; Without Plan; Without Means to Carry Out; Without Access to Means  Homicidal Thoughts:Homicidal Thoughts: No  Sensorium  Memory: Immediate Good; Recent Fair; Remote Fair  Judgment: Fair  Insight: Present  Executive Functions  Concentration: Good  Attention Span: Good  Recall: Fair  Fund of Knowledge: -- (Limited)  Language: Fair  Psychomotor Activity  Psychomotor Activity: Psychomotor Activity: Decreased  Assets  Assets: Communication Skills; Desire for Improvement; Financial Resources/Insurance; Resilience; Social Support  Sleep  Sleep: Sleep: Good Number of Hours of Sleep: 7.5  Physical Exam: Physical Exam Constitutional:      Appearance: Normal appearance.  HENT:     Nose: Nose normal.  Eyes:     Pupils: Pupils are equal, round, and reactive to light.  Pulmonary:     Effort: Pulmonary effort is normal.  Genitourinary:    Comments: Deferred Musculoskeletal:        General: Normal range of motion.     Cervical back: Normal range of motion.  Neurological:     Mental Status: She is alert and oriented to person, place, and time.    Review of Systems  Constitutional:  Negative for fever.  HENT:  Negative for hearing loss.   Eyes:  Negative for blurred vision.  Respiratory:  Negative for cough.   Cardiovascular:  Negative for chest pain.  Gastrointestinal:  Negative for heartburn.  Genitourinary:  Negative for dysuria.  Musculoskeletal:  Negative for myalgias.  Skin:  Negative for rash.  Neurological:  Negative for dizziness.  Psychiatric/Behavioral:  Positive for depression. Negative for hallucinations, memory loss, substance abuse and suicidal ideas. The patient is nervous/anxious and has insomnia.    Blood pressure 93/63, pulse 69, temperature 98.1 F (36.7 C), temperature source  Oral, resp. rate 16, height 4\' 11"  (1.499 m), weight 63 kg, SpO2 95%. Body mass index is 28.05 kg/m.  Treatment Plan Summary: Daily contact with patient to assess and evaluate symptoms and progress in treatment and Medication management.    Still waiting on safe disposition as patient would not be able to live independently given cognitive impairment. Recommend assisted living.    No medication side effects.  Because of urinary incontinence, recommended adult diapers to be worn.   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  Plan:  -Continue Vitamin D 50.000 units weekly for bone health. -Continue Sertraline150 mg po Q daily for depression/anxiety -Continue Buspar 15 mg po bid for anxiety.  -Continue paliperidone 6 mg po qd for mood stabilization -Continue Trazodone 50 mg nightly for insomnia  -Continue Nicoderm 21 mg topically Q 24 hrs for nicotine withdrawal management. -Continue vitamin B12 1000 mcg IM weekly for 4 weeks then monthly -Continue Melatonin 5 mg nightly for sleep -Continue Protonix EC 40 mg p.o. daily for GERD -Continue MiraLAX 17 g p.o. PRN for constipation -Continue hydroxyzine 25 mg p.o. 3 times daily as needed for anxiety -Continue Ensure nutritional shakes TID in between meals  -Previously discontinued Hydroxyzine 50 mg nightly-hypotension in the mornings   Safety and Monitoring: Voluntary admission to inpatient psychiatric unit for safety, stabilization and treatment Daily contact with patient to assess and evaluate symptoms and progress in treatment Patient's case to be discussed in multi-disciplinary team meeting Observation Level : q15 minute checks Vital signs: q12  hours Precautions: Safety    Discharge Planning: Social work and case management to assist with discharge planning and identification of hospital follow-up needs prior to discharge Estimated LOS: Unknown at this time. Discharge Concerns: Need to establish a safety plan; Medication compliance and effectiveness Discharge Goals: Return home with outpatient referrals for mental health follow-up including medication management/psychotherapy No legal guardian, has payee  Armandina Stammer, NP, pmhnp, fnp-bc. 02/21/2023, 10:53 AM Patient ID: Larey Brick, female   DOB: May 31, 1976, 47 y.o.   MRN: [id number] Patient ID: EMIRAH LOZOWSKI, female   DOB: 1975-07-25, 47 y.o.   MRN: [id number] Patient ID: YULIYA CONOVER, female   DOB: 1976-04-30, 47 y.o.   MRN: [id number]

## 2023-02-21 NOTE — [facility] Group Notes (Signed)
[facility] Group Notes:  (Nursing/MHT/Case Management/Adjunct)  Date:  02/21/2023  Time:  8:54 PM  Type of Therapy:  Wrap Up group  Participation Level:  Did Not Attend  Participation Quality:   n/a  Affect:   n/a  Cognitive:   n/a  Insight:  None  Engagement in Group:   did not attend  Modes of Intervention:   n/a  Summary of Progress/Problems:  Serigne Kubicek E Maryella Abood 02/21/2023, 8:54 PM

## 2023-02-21 NOTE — Progress Notes (Signed)
   02/21/23 0900  Psych Admission Type (Psych Patients Only)  Admission Status Involuntary  Psychosocial Assessment  Patient Complaints Anxiety;Depression  Eye Contact Brief  Facial Expression Sad  Affect Depressed;Sad  Speech Slow  Interaction Childlike  Motor Activity Tremors  Appearance/Hygiene Unremarkable  Behavior Characteristics Cooperative  Mood Depressed;Sad  Thought Process  Coherency WDL  Content WDL  Delusions None reported or observed  Perception WDL  Hallucination None reported or observed  Judgment Poor  Confusion None  Danger to Self  Current suicidal ideation? Denies  Self-Injurious Behavior No self-injurious ideation or behavior indicators observed or expressed   Agreement Not to Harm Self Yes  Description of Agreement verbal  Danger to Others  Danger to Others None reported or observed   Alert and oriented x4. Denies SI/HI/AVH. Calm and cooperative but tearful at times. Q31min safety checks remain in place.

## 2023-02-21 NOTE — Group Note (Signed)
Date:  02/21/2023 Time:  11:08 AM  Group Topic/Focus:  Goals Group:   The focus of this group is to help patients establish daily goals to achieve during treatment and discuss how the patient can incorporate goal setting into their daily lives to aide in recovery.    Participation Level:  Did Not Attend                                            Participation Quality:      Affect:  Cognitive:      Insight:   Engagement in Group:    Modes of Intervention:      Additional Comments:                                                                              [name] 02/21/2023, 11:08 AM

## 2023-02-21 NOTE — Progress Notes (Signed)
D) Pt received calm, visible, participating in milieu, and in no acute distress. Pt A & O x4. Pt denies SI, HI, A/ V H, depression, anxiety and pain at this time. A) Pt encouraged to drink fluids. Pt encouraged to come to staff with needs. Pt encouraged to attend and participate in groups. Pt encouraged to set reachable goals.  R) Pt remained safe on unit, in no acute distress, will continue to assess.     02/21/23 2100  Psych Admission Type (Psych Patients Only)  Admission Status Involuntary  Psychosocial Assessment  Patient Complaints Anxiety  Eye Contact Fair  Facial Expression Sad  Affect Sad  Speech Slow  Interaction Childlike  Motor Activity Tremors  Appearance/Hygiene Unremarkable  Behavior Characteristics Cooperative  Mood Sad  Thought Process  Coherency WDL  Content WDL  Delusions None reported or observed  Perception WDL  Hallucination None reported or observed  Judgment Poor  Confusion None  Danger to Self  Current suicidal ideation? Denies  Self-Injurious Behavior No self-injurious ideation or behavior indicators observed or expressed   Agreement Not to Harm Self Yes  Description of Agreement verbal  Danger to Others  Danger to Others None reported or observed

## 2023-02-21 NOTE — Plan of Care (Signed)

## 2023-02-21 NOTE — [facility] Group Notes (Addendum)
Spiritual care group on grief and loss facilitated by Chaplain Dyanne Carrel, [facility]  Group Goal: Support / Education around grief and loss  Members engage in facilitated group support and psycho-social education.  Group Description:  Following introductions and group rules, group members engaged in facilitated group dialogue and support around topic of loss, with particular support around experiences of loss in their lives. Group Identified types of loss (relationships / self / things) and identified patterns, circumstances, and changes that precipitate losses. Reflected on thoughts / feelings around loss, normalized grief responses, and recognized variety in grief experience. Group encouraged individual reflection on safe space and on the coping skills that they are already utilizing.  Group drew on Adlerian / Rogerian and narrative framework  Patient Progress:Yolanda [name] attended group but did not request.  She requested to meet individually with chaplain.  Chaplain will follow tomorrow.

## 2023-02-21 NOTE — Group Note (Signed)
[facility] LCSW Group Therapy Note   Group Date: 02/21/2023 Start Time: 1100 End Time: 1200   Type of Therapy/Topic:  Group Therapy:  Emotion Regulation  Participation Level:  Minimal   Mood:  Description of Group:    The purpose of this group is to assist patients in learning to regulate negative emotions and experience positive emotions. Patients will be guided to discuss ways in which they have been vulnerable to their negative emotions. These vulnerabilities will be juxtaposed with experiences of positive emotions or situations, and patients challenged to use positive emotions to combat negative ones. Special emphasis will be placed on coping with negative emotions in conflict situations, and patients will process healthy conflict resolution skills.  Therapeutic Goals: Patient will identify two positive emotions or experiences to reflect on in order to balance out negative emotions:  Patient will label two or more emotions that they find the most difficult to experience:  Patient will be able to demonstrate positive conflict resolution skills through discussion or role plays:   Summary of Patient Progress:   Patient did not speak in discussion but was present and appeared to be listening.     Therapeutic Modalities:   Cognitive Behavioral Therapy Feelings Identification Dialectical Behavioral Therapy   Starleen Arms, LCSW

## 2023-02-21 NOTE — Plan of Care (Signed)
  Problem: Education: Goal: Utilization of techniques to improve thought processes will improve Outcome: Progressing   Problem: Education: Goal: Knowledge of the prescribed therapeutic regimen will improve Outcome: Progressing   Problem: Activity: Goal: Interest or engagement in leisure activities will improve Outcome: Progressing   Problem: Coping: Goal: Coping ability will improve Outcome: Progressing   Problem: Coping: Goal: Will verbalize feelings Outcome: Progressing   Problem: Safety: Goal: Ability to disclose and discuss suicidal ideas will improve Outcome: Progressing   Problem: Safety: Goal: Ability to disclose and discuss suicidal ideas will improve Outcome: Progressing

## 2023-02-21 NOTE — Progress Notes (Signed)
   02/21/23 0618  15 Minute Checks  Location Bedroom  Visual Appearance Calm  Behavior Composed  Sleep (Behavioral Health Patients Only)  Calculate sleep? (Click Yes once per 24 hr at 0600 safety check) Yes  Documented sleep last 24 hours 8

## 2023-02-22 ENCOUNTER — Encounter (HOSPITAL_COMMUNITY): Payer: Self-pay

## 2023-02-22 DIAGNOSIS — F251 Schizoaffective disorder, depressive type: Secondary | ICD-10-CM | POA: Diagnosis not present

## 2023-02-22 NOTE — Plan of Care (Signed)
Nurse discussed anxiety, depression and coping skills with patient.  

## 2023-02-22 NOTE — BH IP Treatment Plan (Signed)
Interdisciplinary Treatment and Diagnostic Plan Update  02/22/2023 Time of Session: 10:25am (UPDATE) Yolanda [name] MRN: [id number]  Principal Diagnosis: Schizoaffective disorder, depressive type (HCC)  Secondary Diagnoses: Principal Problem:   Schizoaffective disorder, depressive type (HCC) Active Problems:   GERD (gastroesophageal reflux disease)   Intellectual disability   Tobacco use disorder   Current Medications:  Current Facility-Administered Medications  Medication Dose Route Frequency Provider Last Rate Last Admin   acetaminophen (TYLENOL) tablet 650 mg  650 mg Oral Q6H PRN [name], NP   650 mg at 02/11/23 1727   alum & mag hydroxide-simeth (MAALOX/MYLANTA) 200-200-20 MG/5ML suspension 30 mL  30 mL Oral Q4H PRN [name], NP   30 mL at 01/28/23 1530   busPIRone (BUSPAR) tablet 15 mg  15 mg Oral BID [name], NP   15 mg at 02/22/23 0841   cyanocobalamin (VITAMIN B12) injection 1,000 mcg  1,000 mcg Intramuscular Q30 days [name], Nadir, MD   1,000 mcg at 02/02/23 1238   diphenhydrAMINE (BENADRYL) capsule 50 mg  50 mg Oral TID PRN [name], NP   50 mg at 01/15/23 1211   Or   diphenhydrAMINE (BENADRYL) injection 50 mg  50 mg Intramuscular TID PRN [name], NP       feeding supplement (ENSURE ENLIVE / ENSURE PLUS) liquid 237 mL  237 mL Oral TID BM Nkwenti, Doris, NP   237 mL at 02/22/23 1028   haloperidol (HALDOL) tablet 5 mg  5 mg Oral TID PRN [name], NP   5 mg at 01/15/23 1211   Or   haloperidol lactate (HALDOL) injection 5 mg  5 mg Intramuscular TID PRN [name], NP       hydrOXYzine (ATARAX) tablet 25 mg  25 mg Oral TID PRN [name], DO   25 mg at 02/20/23 2156   LORazepam (ATIVAN) tablet 2 mg  2 mg Oral TID PRN [name], NP   2 mg at 01/15/23 1211   Or   LORazepam (ATIVAN) injection 2 mg  2 mg Intramuscular TID PRN [name], NP       magnesium hydroxide (MILK OF MAGNESIA) suspension 30 mL  30 mL Oral Daily PRN  [name], NP   30 mL at 02/15/23 1318   melatonin tablet 5 mg  5 mg Oral QHS Nkwenti, Doris, NP   5 mg at 02/21/23 2135   nicotine (NICODERM CQ - dosed in mg/24 hours) patch 21 mg  21 mg Transdermal Daily [name], DO   21 mg at 02/22/23 7564   paliperidone (INVEGA) 24 hr tablet 6 mg  6 mg Oral Daily [name], NP   6 mg at 02/21/23 2135   pantoprazole (PROTONIX) EC tablet 40 mg  40 mg Oral Daily Nwoko, Nicole Kindred I, NP   40 mg at 02/22/23 0840   polyethylene glycol (MIRALAX / GLYCOLAX) packet 17 g  17 g Oral Daily PRN [name], NP   17 g at 02/18/23 1624   sertraline (ZOLOFT) tablet 150 mg  150 mg Oral Daily Massengill, Harrold Donath, MD   150 mg at 02/22/23 0840   traZODone (DESYREL) tablet 50 mg  50 mg Oral QHS [name], NP   50 mg at 02/21/23 2135   PTA Medications: Medications Prior to Admission  Medication Sig Dispense Refill Last Dose   busPIRone (BUSPAR) 15 MG tablet Take 15 mg by mouth 2 (two) times daily. (Patient not taking: Reported on 12/19/2022)      paliperidone (  INVEGA SUSTENNA) 156 MG/ML SUSY injection Inject 156 mg into the muscle once. (Patient not taking: Reported on 12/19/2022)      sertraline (ZOLOFT) 50 MG tablet Take 150 mg by mouth daily. (Patient not taking: Reported on 12/19/2022)      traZODone (DESYREL) 100 MG tablet Take 100 mg by mouth at bedtime as needed for sleep. (Patient not taking: Reported on 11/12/2022)       Patient Stressors: Medication change or noncompliance    Patient Strengths: Forensic psychologist fund of knowledge   Treatment Modalities: Medication Management, Group therapy, Case management,  1 to 1 session with clinician, Psychoeducation, Recreational therapy.   Physician Treatment Plan for Primary Diagnosis: Schizoaffective disorder, depressive type (HCC) Long Term Goal(s): Improvement in symptoms so as ready for discharge   Short Term Goals: Ability to identify and develop effective coping behaviors will  improve Ability to maintain clinical measurements within normal limits will improve Compliance with prescribed medications will improve Ability to identify triggers associated with substance abuse/mental health issues will improve Ability to identify changes in lifestyle to reduce recurrence of condition will improve Ability to verbalize feelings will improve Ability to disclose and discuss suicidal ideas Ability to demonstrate self-control will improve  Medication Management: Evaluate patient's response, side effects, and tolerance of medication regimen.  Therapeutic Interventions: 1 to 1 sessions, Unit Group sessions and Medication administration.  Evaluation of Outcomes: Progressing  Physician Treatment Plan for Secondary Diagnosis: Principal Problem:   Schizoaffective disorder, depressive type (HCC) Active Problems:   GERD (gastroesophageal reflux disease)   Intellectual disability   Tobacco use disorder  Long Term Goal(s): Improvement in symptoms so as ready for discharge   Short Term Goals: Ability to identify and develop effective coping behaviors will improve Ability to maintain clinical measurements within normal limits will improve Compliance with prescribed medications will improve Ability to identify triggers associated with substance abuse/mental health issues will improve Ability to identify changes in lifestyle to reduce recurrence of condition will improve Ability to verbalize feelings will improve Ability to disclose and discuss suicidal ideas Ability to demonstrate self-control will improve     Medication Management: Evaluate patient's response, side effects, and tolerance of medication regimen.  Therapeutic Interventions: 1 to 1 sessions, Unit Group sessions and Medication administration.  Evaluation of Outcomes: Progressing   RN Treatment Plan for Primary Diagnosis: Schizoaffective disorder, depressive type (HCC) Long Term Goal(s): Knowledge of disease and  therapeutic regimen to maintain health will improve  Short Term Goals: Ability to remain free from injury will improve, Ability to verbalize frustration and anger appropriately will improve, Ability to participate in decision making will improve, Ability to verbalize feelings will improve, Ability to identify and develop effective coping behaviors will improve, and Compliance with prescribed medications will improve  Medication Management: RN will administer medications as ordered by provider, will assess and evaluate patient's response and provide education to patient for prescribed medication. RN will report any adverse and/or side effects to prescribing provider.  Therapeutic Interventions: 1 on 1 counseling sessions, Psychoeducation, Medication administration, Evaluate responses to treatment, Monitor vital signs and CBGs as ordered, Perform/monitor CIWA, COWS, AIMS and Fall Risk screenings as ordered, Perform wound care treatments as ordered.  Evaluation of Outcomes: Progressing   LCSW Treatment Plan for Primary Diagnosis: Schizoaffective disorder, depressive type (HCC) Long Term Goal(s): Safe transition to appropriate next level of care at discharge, Engage patient in therapeutic group addressing interpersonal concerns.  Short Term Goals: Engage patient in aftercare planning with  referrals and resources, Increase social support, Increase emotional regulation, Facilitate acceptance of mental health diagnosis and concerns, Identify triggers associated with mental health/substance abuse issues, and Increase skills for wellness and recovery  Therapeutic Interventions: Assess for all discharge needs, 1 to 1 time with Social worker, Explore available resources and support systems, Assess for adequacy in community support network, Educate family and significant other(s) on suicide prevention, Complete Psychosocial Assessment, Interpersonal group therapy.  Evaluation of Outcomes:  Progressing   Progress in Treatment: Attending groups: Yes. Participating in groups: Yes. Taking medication as prescribed: Yes. Toleration medication: Yes. Family/Significant other contact made: No, will contact:  has meeting with social worker with [employer] this week Patient understands diagnosis: No. Discussing patient identified problems/goals with staff: Yes. Medical problems stabilized or resolved: Yes. Denies suicidal/homicidal ideation: Yes. Issues/concerns per patient self-inventory: No.    New problem(s) identified: No, Describe:  none reported   New Short Term/Long Term Goal(s):medication stabilization, elimination of SI thoughts, development of comprehensive mental wellness plan.     Patient Goals:  coping skills   Discharge Plan or Barriers:  CSW will continue to follow and assess for appropriate referrals and possible discharge planning.      Reason for Continuation of Hospitalization: Medication stabilization   Estimated Length of Stay: unknown  Last 3 [place] Suicide Severity Risk Score: Flowsheet Row Ad[facility] (Current) [facility] from 12[facility]19/2022 in [facility] Department at [facility] ED from 11/12/2022 in [facility] Emergency Department at [facility]  C-SSRS RISK CATEGORY High Risk High Risk Moderate Risk       Last [facility] 2/9 Scores:     No data to display          Scribe for Treatment Team: Izell Eloy, LCSW 02/22/2023 1:23 PM

## 2023-02-22 NOTE — Progress Notes (Signed)
Chaplain provided follow up support to Yolanda [name], at her request.  Yolanda [name] stated that she had been given a test to be placed in a group home and that she had passed it. She is hopeful that this will mean she can go to the group home soon.  She continues to feel grief that people do not feel that she is smart and even call her names behind her back.  She feels that she is fully capable of caring for herself, but that no one else feels that she is able to do this. She wants to live in a place where she can come and go as she wants/needs.  She wants to see her family and especially her grandson. She had the Bible open and chaplain asked if she wanted to read a verse.  She requested Psalm 23 and we read it together.

## 2023-02-22 NOTE — [facility] Group Notes (Signed)
Adult Psychoeducational Group Note  Date:  02/22/2023 Time:  9:46 PM  Group Topic/Focus:  Wrap-Up Group:   The focus of this group is to help patients review their daily goal of treatment and discuss progress on daily workbooks.  Participation Level:  Did Not Attend  Christ Kick 02/22/2023, 9:46 PM

## 2023-02-22 NOTE — Progress Notes (Signed)
[facility] MD Progress Note  02/22/2023 5:10 PM Kiearra LAVERGNE CABLER  MRN:  [id number] Principal Problem: Schizoaffective disorder, depressive type (HCC) Diagnosis: Principal Problem:   Schizoaffective disorder, depressive type (HCC) Active Problems:   GERD (gastroesophageal reflux disease)   Intellectual disability   Tobacco use disorder  Reason for admission: This is the first psychiatric admission in this [facility] in 12 years for this 47 AA female with an extensive hx of mental illnesses & probable polysubstance use disorders. She is admitted to the [facility] from the [facility] hospital with complain of worsening suicidal ideations with plan to stab herself. Per chart review, patient apparently reported at the ED that she has been depressed for a while & has not been taking her mental health medications. After medical evaluation.clearance, she was transferred to the [facility] for further psychiatric evaluation/treatments.   Daily notes: Jyla is seen in her room this morning. She was lying down in bed. She presents alert, oriented & aware of situation. Patient is visible on the unit, attending group sessions. Requires reminders & encouragement from time to time to come out of her room to attend group sessions. She is fairly groomed. Patient is taking & tolerating her treatment regimen. Denies any side effects. During this evaluation, Haelie reports, I'm doing well. Can I do face-time with my grandson today? Patient is homeless and because of her physical/intellectual limitations, patient will not be able to survive on the streets or the homeless shelters if discharged as she is at this time. Patient remains hopeful to hear about the much anticipated good news of having a place to call home after discharge. Sheriden is also asking if her 50 year old son can visit her here in the hospital? She was informed that, her request to have her 70 year old grandson visit her here will not be granted because the child is too young for  the age limit allowed to visit. She wonders if she could do a face-time with her grandson. She was informed that this provider will find out & will let her know. The nursing staff states that the face-time can only happen during visiting ours which is okay with the patient. Discussed this case with the attending psychiatrist. There are no changes made on her current plan of care. Her vital signs remain stable. Will continue as already in progress. Patient remains in no apparent distress.     Total Time spent with patient:  35 minutes  Past Psychiatric History: See H & P  Past Medical History:  Past Medical History:  Diagnosis Date   Bipolar affect, depressed (HCC)    Constipation 08/17/2022   Depression    Falls 07/21/2022   Fracture of femoral neck, right, closed (HCC) 01/17/2022   Herpes simplex 08/22/2017   Open fracture dislocation of right elbow joint 01/17/2022    Past Surgical History:  Procedure Laterality Date   NO PAST SURGERIES     SALPINGECTOMY     Family History: History reviewed. No pertinent family history.  Family Psychiatric  History: See H & P  Social History:  Social History   Substance and Sexual Activity  Alcohol Use Yes     Social History   Substance and Sexual Activity  Drug Use Yes   Types: Cocaine, Marijuana    Social History   Socioeconomic History   Marital status: Single    Spouse name: Not on file   Number of children: Not on file   Years of education: Not on file  Highest education level: Not on file  Occupational History   Not on file  Tobacco Use   Smoking status: Every Day   Smokeless tobacco: Not on file  Substance and Sexual Activity   Alcohol use: Yes   Drug use: Yes    Types: Cocaine, Marijuana   Sexual activity: Yes  Other Topics Concern   Not on file  Social History Narrative   Not on file   Social Determinants of Health   Financial Resource Strain: Low Risk  (09/12/2022)   Received from [facility], [facility]    Overall Financial Resource Strain (CARDIA)    Difficulty of Paying Living Expenses: Not hard at all  Food Insecurity: Patient Declined (12/20/2022)   Hunger Vital Sign    Worried About Running Out of Food in the Last Year: Patient declined    Ran Out of Food in the Last Year: Patient declined  Transportation Needs: No Transportation Needs (12/20/2022)   PRAPARE - Administrator, Civil Service (Medical): No    Lack of Transportation (Non-Medical): No  Physical Activity: Not on file  Stress: No Stress Concern Present (07/17/2022)   Received [facility], [facility] - Occupational Stress Questionnaire    Feeling of Stress : Not at all  Social Connections: Unknown (07/16/2022)   Received from [facility], [facility]   Social Network    Social Network: Not on file   Sleep: Good  Appetite:  Good  Current Medications: Current Facility-Administered Medications  Medication Dose Route Frequency Provider Last Rate Last Admin   acetaminophen (TYLENOL) tablet 650 mg  650 mg Oral Q6H PRN [name], NP   650 mg at 02/11/23 1727   alum & mag hydroxide-simeth (MAALOX/MYLANTA) 200-200-20 MG/5ML suspension 30 mL  30 mL Oral Q4H PRN [name], NP   30 mL at 01/28/23 1530   busPIRone (BUSPAR) tablet 15 mg  15 mg Oral BID [name], NP   15 mg at 02/22/23 1706   cyanocobalamin (VITAMIN B12) injection 1,000 mcg  1,000 mcg Intramuscular Q30 days [name], Nadir, MD   1,000 mcg at 02/02/23 1238   diphenhydrAMINE (BENADRYL) capsule 50 mg  50 mg Oral TID PRN [name], NP   50 mg at 01/15/23 1211   Or   diphenhydrAMINE (BENADRYL) injection 50 mg  50 mg Intramuscular TID PRN [name], NP       feeding supplement (ENSURE ENLIVE / ENSURE PLUS) liquid 237 mL  237 mL Oral TID BM Nkwenti, Doris, NP   237 mL at 02/22/23 1348   haloperidol (HALDOL) tablet 5 mg  5 mg Oral TID PRN [name], NP   5 mg at 01/15/23 1211   Or    haloperidol lactate (HALDOL) injection 5 mg  5 mg Intramuscular TID PRN [name], NP       hydrOXYzine (ATARAX) tablet 25 mg  25 mg Oral TID PRN [name], DO   25 mg at 02/20/23 2156   LORazepam (ATIVAN) tablet 2 mg  2 mg Oral TID PRN [name], NP   2 mg at 01/15/23 1211   Or   LORazepam (ATIVAN) injection 2 mg  2 mg Intramuscular TID PRN [name], NP       magnesium hydroxide (MILK OF MAGNESIA) suspension 30 mL  30 mL Oral Daily PRN [name], NP   30 mL at 02/15/23 1318   melatonin tablet 5 mg  5  mg Oral QHS [name], NP   5 mg at 02/21/23 2135   nicotine (NICODERM CQ - dosed in mg/24 hours) patch 21 mg  21 mg Transdermal Daily [name], DO   21 mg at 02/22/23 0102   paliperidone (INVEGA) 24 hr tablet 6 mg  6 mg Oral Daily [name], NP   6 mg at 02/21/23 2135   pantoprazole (PROTONIX) EC tablet 40 mg  40 mg Oral Daily Krikor Willet, Nicole Kindred I, NP   40 mg at 02/22/23 0840   polyethylene glycol (MIRALAX / GLYCOLAX) packet 17 g  17 g Oral Daily PRN [name], NP   17 g at 02/18/23 1624   sertraline (ZOLOFT) tablet 150 mg  150 mg Oral Daily Massengill, Harrold Donath, MD   150 mg at 02/22/23 0840   traZODone (DESYREL) tablet 50 mg  50 mg Oral QHS [name], NP   50 mg at 02/21/23 2135   Lab Results: No results found for this or any previous visit (from the past 48 hour(s)).  Blood Alcohol level:  Lab Results  Component Value Date   ETH <10 12/19/2022   ETH <10 11/21/2019   Metabolic Disorder Labs: Lab Results  Component Value Date   HGBA1C 4.4 (L) 12/21/2022   MPG 79.58 12/21/2022   No results found for: "PROLACTIN" Lab Results  Component Value Date   CHOL 218 (H) 12/21/2022   TRIG 81 12/21/2022   HDL 53 12/21/2022   CHOLHDL 4.1 12/21/2022   VLDL 16 12/21/2022   LDLCALC 149 (H) 12/21/2022   LDLCALC 110 (H) 03/13/2011   Physical Findings: AIMS: Facial and Oral Movements Muscles of Facial Expression: None, normal Lips and Perioral Area: None,  normal Jaw: None, normal Tongue: None, normal,Extremity Movements Upper (arms, wrists, hands, fingers): None, normal Lower (legs, knees, ankles, toes): None, normal, Trunk Movements Neck, shoulders, hips: None, normal, Overall Severity Severity of abnormal movements (highest score from questions above): None, normal Incapacitation due to abnormal movements: None, normal Patient's awareness of abnormal movements (rate only patient's report): No Awareness, Dental Status Current problems with teeth and/or dentures?: No Does patient usually wear dentures?: No  CIWA:    COWS:    AIMS:0 Musculoskeletal: Strength & Muscle Tone: within normal limits Gait & Station: normal Patient leans: N/A  Psychiatric Specialty Exam:  Presentation  General Appearance:  Casual; Fairly Groomed  Eye Contact: Fair  Speech: Garbled (patient is able to make needs known.)  Speech Volume: Decreased  Handedness: Right  Mood and Affect  Mood: -- (Hopeful)  Affect: Congruent  Thought Process  Thought Processes: Coherent; Goal Directed; Linear  Descriptions of Associations:Intact  Orientation:Full (Time, Place and Person)  Thought Content:Logical  History of Schizophrenia/Schizoaffective disorder:Yes  Duration of Psychotic Symptoms:Greater than six months  Hallucinations:Hallucinations: None Description of Auditory Hallucinations: NA  Ideas of Reference:None  Suicidal Thoughts:Suicidal Thoughts: No SI Active Intent and/or Plan: Without Intent; Without Plan; Without Means to Carry Out; Without Access to Means SI Passive Intent and/or Plan: Without Intent; Without Plan; Without Means to Carry Out; Without Access to Means  Homicidal Thoughts:Homicidal Thoughts: No  Sensorium  Memory: Immediate Good; Recent Fair; Remote Fair  Judgment: Fair  Insight: Present  Executive Functions  Concentration: Good  Attention Span: Good  Recall: Fair  Fund of Knowledge: --  (Limited)  Language: Fair  Psychomotor Activity  Psychomotor Activity: Psychomotor Activity: Decreased  Assets  Assets: Communication Skills; Desire for Improvement; Financial Resources/Insurance; Resilience; Social Support  Sleep  Sleep: Sleep: Good Number  of Hours of Sleep: 7.5  Physical Exam: Physical Exam Vitals and nursing note reviewed.  Constitutional:      Appearance: Normal appearance.  HENT:     Nose: Nose normal.  Eyes:     Pupils: Pupils are equal, round, and reactive to light.  Pulmonary:     Effort: Pulmonary effort is normal.  Genitourinary:    Comments: Deferred Musculoskeletal:        General: Normal range of motion.     Cervical back: Normal range of motion.  Neurological:     Mental Status: She is alert and oriented to person, place, and time.   Review of Systems  Constitutional:  Negative for fever.  HENT:  Negative for hearing loss.   Eyes:  Negative for blurred vision.  Respiratory:  Negative for cough.   Cardiovascular:  Negative for chest pain.  Gastrointestinal:  Negative for heartburn.  Genitourinary:  Negative for dysuria.  Musculoskeletal:  Negative for myalgias.  Skin:  Negative for rash.  Neurological:  Negative for dizziness.  Psychiatric/Behavioral:  Positive for depression. Negative for hallucinations, memory loss, substance abuse and suicidal ideas. The patient is nervous/anxious and has insomnia.    Blood pressure 94/73, pulse 91, temperature 97.7 F (36.5 C), temperature source Oral, resp. rate 18, height 4\' 11"  (1.499 m), weight 63 kg, SpO2 100%. Body mass index is 28.05 kg/m.  Treatment Plan Summary: Daily contact with patient to assess and evaluate symptoms and progress in treatment and Medication management.    Still waiting on safe disposition as patient would not be able to live independently given cognitive impairment. Recommend assisted living.    No medication side effects.  Because of urinary incontinence,  recommended adult diapers to be worn.   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  Plan:  -Continue Vitamin D 50.000 units weekly for bone health. -Continue Sertraline150 mg po Q daily for depression/anxiety -Continue Buspar 15 mg po bid for anxiety.  -Continue paliperidone 6 mg po qd for mood stabilization -Continue Trazodone 50 mg nightly for insomnia  -Continue Nicoderm 21 mg topically Q 24 hrs for nicotine withdrawal management. -Continue vitamin B12 1000 mcg IM weekly for 4 weeks then monthly -Continue Melatonin 5 mg nightly for sleep -Continue Protonix EC 40 mg p.o. daily for GERD -Continue MiraLAX 17 g p.o. PRN for constipation -Continue hydroxyzine 25 mg p.o. 3 times daily as needed for anxiety -Continue Ensure nutritional shakes TID in between meals  -Previously discontinued Hydroxyzine 50 mg nightly-hypotension in the mornings   Safety and Monitoring: Voluntary admission to inpatient psychiatric unit for safety, stabilization and treatment Daily contact with patient to assess and evaluate symptoms and progress in treatment Patient's case to be discussed in multi-disciplinary team meeting Observation Level : q15 minute checks Vital signs: q12 hours Precautions: Safety   Discharge Planning: Social work and case management to assist with discharge planning and identification of hospital follow-up needs prior to discharge Estimated LOS: Unknown at this time. Discharge Concerns: Need to establish a safety plan; Medication compliance and effectiveness Discharge Goals: Return home with outpatient referrals for mental health follow-up including medication management/psychotherapy No legal guardian, has payee  Armandina Stammer, NP, pmhnp, fnp-bc. 02/22/2023, 5:10 PM Patient ID: Larey Brick, female   DOB: 07/04/1976, 47 y.o.   MRN: [id number]  Patient ID: ANAMTA ALLEGRA, female   DOB: 1975-11-05, 47 y.o.   MRN: [id number] Patient ID: PSALMS CROSSWELL, female   DOB: Apr 21, 1976, 48 y.o.   MRN: [id number] Patient ID: EMMANUEL LUDLUM, female   DOB: 1975-07-21, 47 y.o.   MRN: [id number]

## 2023-02-22 NOTE — Progress Notes (Signed)
D: Patient has poor sleep, no sleep medication. Good appetite, normal energy level, good concentration.  Rated depression, anxiety and hopeless zero.  Denied withdrawals.  Denied SI.  Denied physical problems.  Denied physical pain.  Goal is participate mor.  No discharge plans. A:  Medications administered per MD orders.  Emotional support and encouragement given patient. R:  Denied SI and HI, contracts for safety.  Denied A/V hallucinations.  Safety maintained with 15 minute checks.

## 2023-02-23 DIAGNOSIS — F251 Schizoaffective disorder, depressive type: Secondary | ICD-10-CM | POA: Diagnosis not present

## 2023-02-23 MED ORDER — VITAMIN D (ERGOCALCIFEROL) 1.25 MG (50000 UNIT) PO CAPS
50000.0000 [IU] | ORAL_CAPSULE | ORAL | Status: DC
  Administered 2023-02-23 – 2023-08-03 (×22): 50000 [IU] via ORAL
  Filled 2023-02-23 (×26): qty 1

## 2023-02-23 NOTE — Progress Notes (Signed)
[facility] MD Progress Note  02/23/2023 1:37 PM Mckinzee YSABELA IACOBELLI  MRN:  [id number] Principal Problem: Schizoaffective disorder, depressive type (HCC) Diagnosis: Principal Problem:   Schizoaffective disorder, depressive type (HCC) Active Problems:   GERD (gastroesophageal reflux disease)   Intellectual disability   Tobacco use disorder  Reason for admission: This is the first psychiatric admission in this [facility] in 12 years for this 21 AA female with an extensive hx of mental illnesses & probable polysubstance use disorders. She is admitted to the [facility] from the [facility] hospital with complain of worsening suicidal ideations with plan to stab herself. Per chart review, patient apparently reported at the ED that she has been depressed for a while & has not been taking her mental health medications. After medical evaluation.clearance, she was transferred to the [facility] for further psychiatric evaluation/treatments.   24 hr chart review: Sleep Hours last night: 6.25 hr last night as per nursing reports & documentation. Pt reports a poor night's sleep. Nursing Concerns: None reported by nursing, but reported hopelessness regarding inability to see her grandson as per Chaplain's note. Behavioral episodes in the past 24 hrs: None Medication Compliance: Compliant  Vital Signs in the past 24 hrs: WNL PRN Medications in the past 24 hrs:  Tylenol  Patient assessment note: On assessment today, the pt reports that their mood is depressed. She is observed to be isolating in her room today, and as per staff reports, has not come out of room since morning. Provided with positive verbal reinforcements by writer to come out to room and participate in activities with peers, & verbalized understanding, but refused to come out. Continued to be lying in bed in a fetal position. Staff informed to continue to provide positive verbal reinforcements for patient to get out of her room. Reports that anxiety is moderate. Sleep is  poor. Appetite is fair. Concentration is poor.  Energy level is low. Denies suicidal thoughts. Denies suicidal intent and plan.  Denies having any HI.  Denies having psychotic symptoms.   Denies having side effects to current psychiatric medications.  Discussed the following psychosocial stressors: Remaining hospitalized. Pt educated on the fact that we continue to await DSS to coordinate with CSW to find a community placement that will meet her needs and will be safe for her to discharge to, but unable to comprehend this, and continues to feel abandoned by her family. Empathy and active listening is continuing to be given. Continuing all meds at this time as listed below. Starting Vitamin D 50.000 units weekly as Vitamin D level was 19.70 on 07/24.      Total Time spent with patient:  35 minutes  Past Psychiatric History: See H & P  Past Medical History:  Past Medical History:  Diagnosis Date   Bipolar affect, depressed (HCC)    Constipation 08/17/2022   Depression    Falls 07/21/2022   Fracture of femoral neck, right, closed (HCC) 01/17/2022   Herpes simplex 08/22/2017   Open fracture dislocation of right elbow joint 01/17/2022    Past Surgical History:  Procedure Laterality Date   NO PAST SURGERIES     SALPINGECTOMY     Family History: History reviewed. No pertinent family history.  Family Psychiatric  History: See H & P  Social History:  Social History   Substance and Sexual Activity  Alcohol Use Yes     Social History   Substance and Sexual Activity  Drug Use Yes   Types: Cocaine, Marijuana  Social History   Socioeconomic History   Marital status: Single    Spouse name: Not on file   Number of children: Not on file   Years of education: Not on file   Highest education level: Not on file  Occupational History   Not on file  Tobacco Use   Smoking status: Every Day   Smokeless tobacco: Not on file  Substance and Sexual Activity   Alcohol use: Yes    Drug use: Yes    Types: Cocaine, Marijuana   Sexual activity: Yes  Other Topics Concern   Not on file  Social History Narrative   Not on file   Social Determinants of Health   Financial Resource Strain: Low Risk  (09/12/2022)   Received from [facility], [facility]   Overall Financial Resource Strain (CARDIA)    Difficulty of Paying Living Expenses: Not hard at all  Food Insecurity: Patient Declined (12/20/2022)   Hunger Vital Sign    Worried About Running Out of Food in the Last Year: Patient declined    Ran Out of Food in the Last Year: Patient declined  Transportation Needs: No Transportation Needs (12/20/2022)   PRAPARE - Administrator, Civil Service (Medical): No    Lack of Transportation (Non-Medical): No  Physical Activity: Not on file  Stress: No Stress Concern Present (07/17/2022)   Received from [facility], [facility] - Occupational Stress Questionnaire    Feeling of Stress : Not at all  Social Connections: Unknown (07/16/2022)   Received from [facility], [facility]   Social Network    Social Network: Not on file   Sleep: Good  Appetite:  Good  Current Medications: Current Facility-Administered Medications  Medication Dose Route Frequency Provider Last Rate Last Admin   acetaminophen (TYLENOL) tablet 650 mg  650 mg Oral Q6H PRN [name], NP   650 mg at 02/22/23 2214   alum & mag hydroxide-simeth (MAALOX/MYLANTA) 200-200-20 MG/5ML suspension 30 mL  30 mL Oral Q4H PRN [name], NP   30 mL at 01/28/23 1530   busPIRone (BUSPAR) tablet 15 mg  15 mg Oral BID [name], NP   15 mg at 02/23/23 0827   cyanocobalamin (VITAMIN B12) injection 1,000 mcg  1,000 mcg Intramuscular Q30 days [name], Nadir, MD   1,000 mcg at 02/02/23 1238   diphenhydrAMINE (BENADRYL) capsule 50 mg  50 mg Oral TID PRN [name], NP   50 mg at 01/15/23 1211   Or   diphenhydrAMINE (BENADRYL) injection 50 mg  50 mg  Intramuscular TID PRN [name], NP       feeding supplement (ENSURE ENLIVE / ENSURE PLUS) liquid 237 mL  237 mL Oral TID BM Sheilia Reznick, NP   237 mL at 02/22/23 2216   haloperidol (HALDOL) tablet 5 mg  5 mg Oral TID PRN [name], NP   5 mg at 01/15/23 1211   Or   haloperidol lactate (HALDOL) injection 5 mg  5 mg Intramuscular TID PRN [name], NP       hydrOXYzine (ATARAX) tablet 25 mg  25 mg Oral TID PRN [name], DO   25 mg at 02/20/23 2156   LORazepam (ATIVAN) tablet 2 mg  2 mg Oral TID PRN [name], NP   2 mg at 01/15/23 1211   Or   LORazepam (ATIVAN) injection 2 mg  2 mg Intramuscular TID PRN [name], NP  magnesium hydroxide (MILK OF MAGNESIA) suspension 30 mL  30 mL Oral Daily PRN [name], NP   30 mL at 02/15/23 1318   melatonin tablet 5 mg  5 mg Oral QHS Jac Romulus, NP   5 mg at 02/22/23 2214   nicotine (NICODERM CQ - dosed in mg/24 hours) patch 21 mg  21 mg Transdermal Daily [name], DO   21 mg at 02/22/23 4696   paliperidone (INVEGA) 24 hr tablet 6 mg  6 mg Oral Daily [name], NP   6 mg at 02/22/23 2214   pantoprazole (PROTONIX) EC tablet 40 mg  40 mg Oral Daily [name], NP   40 mg at 02/23/23 2952   polyethylene glycol (MIRALAX / GLYCOLAX) packet 17 g  17 g Oral Daily PRN [name], NP   17 g at 02/18/23 1624   sertraline (ZOLOFT) tablet 150 mg  150 mg Oral Daily Massengill, Harrold Donath, MD   150 mg at 02/23/23 8413   traZODone (DESYREL) tablet 50 mg  50 mg Oral QHS [name], NP   50 mg at 02/22/23 2214   Vitamin D (Ergocalciferol) (DRISDOL) 1.25 MG (50000 UNIT) capsule 50,000 Units  50,000 Units Oral Q7 days [name], NP       Lab Results: No results found for this or any previous visit (from the past 48 hour(s)).  Blood Alcohol level:  Lab Results  Component Value Date   ETH <10 12/19/2022   ETH <10 11/21/2019   Metabolic Disorder Labs: Lab Results  Component Value Date   HGBA1C 4.4 (L)  12/21/2022   MPG 79.58 12/21/2022   No results found for: "PROLACTIN" Lab Results  Component Value Date   CHOL 218 (H) 12/21/2022   TRIG 81 12/21/2022   HDL 53 12/21/2022   CHOLHDL 4.1 12/21/2022   VLDL 16 12/21/2022   LDLCALC 149 (H) 12/21/2022   LDLCALC 110 (H) 03/13/2011   Physical Findings: AIMS: Facial and Oral Movements Muscles of Facial Expression: None, normal Lips and Perioral Area: None, normal Jaw: None, normal Tongue: None, normal,Extremity Movements Upper (arms, wrists, hands, fingers): None, normal Lower (legs, knees, ankles, toes): None, normal, Trunk Movements Neck, shoulders, hips: None, normal, Overall Severity Severity of abnormal movements (highest score from questions above): None, normal Incapacitation due to abnormal movements: None, normal Patient's awareness of abnormal movements (rate only patient's report): No Awareness, Dental Status Current problems with teeth and/or dentures?: No Does patient usually wear dentures?: No  CIWA:    COWS:    AIMS:0 Musculoskeletal: Strength & Muscle Tone: within normal limits Gait & Station: normal Patient leans: N/A  Psychiatric Specialty Exam:  Presentation  General Appearance:  Disheveled  Eye Contact: Minimal  Speech: Clear and Coherent  Speech Volume: Decreased  Handedness: Right  Mood and Affect  Mood: Anxious; Depressed  Affect: Congruent  Thought Process  Thought Processes: Coherent  Descriptions of Associations:Intact  Orientation:Partial  Thought Content:Logical  History of Schizophrenia/Schizoaffective disorder:Yes  Duration of Psychotic Symptoms:Greater than six months  Hallucinations:Hallucinations: None   Ideas of Reference:None  Suicidal Thoughts:Suicidal Thoughts: No   Homicidal Thoughts:Homicidal Thoughts: No   Sensorium  Memory: Immediate Good  Judgment: Poor  Insight: Poor  Executive Functions  Concentration: Poor  Attention  Span: Poor  Recall: Fair  Fund of Knowledge: Poor  Language: Poor  Psychomotor Activity  Psychomotor Activity: Psychomotor Activity: Normal   Assets  Assets: Manufacturing systems engineer; Resilience  Sleep  Sleep: Sleep: Fair   Physical Exam: Physical Exam  Vitals and nursing note reviewed.  Constitutional:      Appearance: Normal appearance.  HENT:     Nose: Nose normal.  Eyes:     Pupils: Pupils are equal, round, and reactive to light.  Pulmonary:     Effort: Pulmonary effort is normal.  Genitourinary:    Comments: Deferred Musculoskeletal:        General: Normal range of motion.     Cervical back: Normal range of motion.  Neurological:     Mental Status: She is alert and oriented to person, place, and time.    Review of Systems  Constitutional:  Negative for fever.  HENT:  Negative for hearing loss.   Eyes:  Negative for blurred vision.  Respiratory:  Negative for cough.   Cardiovascular:  Negative for chest pain.  Gastrointestinal:  Negative for heartburn.  Genitourinary:  Negative for dysuria.  Musculoskeletal:  Negative for myalgias.  Skin:  Negative for rash.  Neurological:  Negative for dizziness.  Psychiatric/Behavioral:  Positive for depression. Negative for hallucinations, memory loss, substance abuse and suicidal ideas. The patient is nervous/anxious and has insomnia.    Blood pressure 91/64, pulse 91, temperature 97.8 F (36.6 C), temperature source Oral, resp. rate 18, height 4\' 11"  (1.499 m), weight 63 kg, SpO2 100%. Body mass index is 28.05 kg/m.  Treatment Plan Summary: Daily contact with patient to assess and evaluate symptoms and progress in treatment and Medication management.    Still waiting on safe disposition as patient would not be able to live independently given cognitive impairment. Recommend assisted living.    No medication side effects.  Because of urinary incontinence, recommended adult diapers to be worn.    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  Plan:  -Restart Vitamin D 50.000 units weekly for bone health. -Continue Sertraline150 mg po Q daily for depression/anxiety -Continue Buspar 15 mg po bid for anxiety.  -Continue paliperidone 6 mg po qd for mood stabilization -Continue Trazodone 50 mg nightly for insomnia  -Continue Nicoderm 21 mg topically Q 24 hrs for nicotine withdrawal management. -Continue vitamin B12 1000 mcg IM weekly for 4 weeks then monthly -Continue Melatonin 5 mg nightly for sleep -Continue Protonix EC 40 mg p.o. daily for GERD -Continue MiraLAX 17 g p.o. PRN for constipation -Continue hydroxyzine 25 mg p.o. 3 times daily as needed for anxiety -Continue Ensure nutritional shakes TID in between meals  -Previously discontinued Hydroxyzine 50 mg nightly-hypotension in the mornings   Safety and Monitoring: Voluntary admission to inpatient psychiatric unit for safety, stabilization and treatment Daily contact with patient to assess and evaluate symptoms and progress in treatment Patient's case to be discussed in multi-disciplinary team meeting Observation Level : q15 minute checks Vital signs: q12 hours Precautions: Safety   Discharge Planning: Social work and case management to assist with discharge planning and identification of hospital follow-up needs prior to discharge Estimated LOS: Unknown at this time. Discharge Concerns: Need to establish a safety plan; Medication compliance and effectiveness Discharge Goals: Return home with outpatient referrals for mental health follow-up including medication management/psychotherapy No legal guardian, has payee  [name], NP, pmhnp 02/23/2023, 1:37  PM Patient ID: Larey Brick, female   DOB: 03-Sep-1975, 48 y.o.   MRN: [id number]

## 2023-02-23 NOTE — Group Note (Signed)
Date:  02/23/2023 Time:  10:09 PM  Group Topic/Focus:  Wrap-Up Group:   The focus of this group is to help patients review their daily goal of treatment and discuss progress on daily workbooks.    Participation Level:  Minimal  Participation Quality:  Appropriate and Sharing  Affect:  Appropriate  Cognitive:  Appropriate  Insight: Appropriate and Limited  Engagement in Group:  Developing/Improving, Engaged, and Limited  Modes of Intervention:  Discussion and Socialization  Additional Comments:  The patient was limited during group; didn't say much in the beginning but eventually opened up more towards the end. The patient stated that she slept most of the day and stayed in her room. She stated that she attended both lunch and dinner. The patient rated her day 5 out of 10. The patient talked about how she needs somewhere to go once discharged and how her treatment team is assisting her with that.   Kennieth Francois 02/23/2023, 10:09 PM

## 2023-02-23 NOTE — Progress Notes (Signed)
   02/23/23 2015  Psych Admission Type (Psych Patients Only)  Admission Status Involuntary  Psychosocial Assessment  Patient Complaints Sadness  Eye Contact Fair  Facial Expression Flat  Affect Appropriate to circumstance;Sad  Speech Soft  Interaction Childlike;No initiation  Motor Activity Slow  Appearance/Hygiene Unremarkable  Behavior Characteristics Appropriate to situation  Mood Sad;Pleasant  Thought Process  Coherency WDL  Content WDL  Delusions None reported or observed  Perception WDL  Hallucination None reported or observed  Judgment Poor  Confusion None  Danger to Self  Current suicidal ideation? Denies

## 2023-02-23 NOTE — Progress Notes (Signed)
   02/23/23 0615  15 Minute Checks  Location Dayroom  Visual Appearance Calm  Behavior Composed  Sleep (Behavioral Health Patients Only)  Calculate sleep? (Click Yes once per 24 hr at 0600 safety check) Yes  Documented sleep last 24 hours 6.25

## 2023-02-23 NOTE — Progress Notes (Signed)
   02/23/23 1300  Psych Admission Type (Psych Patients Only)  Admission Status Involuntary  Psychosocial Assessment  Patient Complaints Sadness  Eye Contact Fair  Facial Expression Sad  Affect Sad  Speech Slow  Interaction Childlike  Motor Activity Tremors  Appearance/Hygiene Unremarkable  Behavior Characteristics Cooperative  Mood Sad  Thought Process  Coherency WDL  Content WDL  Delusions None reported or observed  Perception WDL  Hallucination None reported or observed  Judgment Poor  Confusion None  Danger to Self  Current suicidal ideation? Denies  Self-Injurious Behavior No self-injurious ideation or behavior indicators observed or expressed   Danger to Others  Danger to Others None reported or observed

## 2023-02-23 NOTE — Plan of Care (Signed)
  Problem: Education: Goal: Utilization of techniques to improve thought processes will improve Outcome: Progressing Goal: Knowledge of the prescribed therapeutic regimen will improve Outcome: Progressing   Problem: Activity: Goal: Interest or engagement in leisure activities will improve Outcome: Progressing Goal: Imbalance in normal sleep/wake cycle will improve Outcome: Progressing   

## 2023-02-24 DIAGNOSIS — F251 Schizoaffective disorder, depressive type: Secondary | ICD-10-CM | POA: Diagnosis not present

## 2023-02-24 NOTE — BHH Group Notes (Signed)
LCSW Group Therapy Note  02/24/2023   10:00-11:00am   Type of Therapy and Topic:  Group Therapy: Anger Cues and Responses  Participation Level:  None   Description of Group:   In this group, patients learned how to recognize the physical, cognitive, emotional, and behavioral responses they have to anger-provoking situations.  They identified a recent time they became angry and how they reacted.  They analyzed how their reaction was possibly beneficial and how it was possibly unhelpful.  The group discussed a variety of healthier coping skills that could help with such a situation in the future.  They also learned that anger is a second emotion fueled by other feelings and explored their own emotions that may frequently fuel their anger.  Focus was placed on how helpful it is to recognize the underlying emotions to our anger, because working on those can lead to a more permanent solution as well as our ability to focus on the important rather than the urgent.  Therapeutic Goals: Patients will remember their last incident of anger and how they felt emotionally and physically, what their thoughts were at the time, and how they behaved. Patients will identify how their behavior at that time worked for them, as well as how it worked against them. Patients will explore possible new behaviors to use in future anger situations. Patients will learn that anger itself is normal and cannot be eliminated, and that healthier reactions can assist with resolving conflict rather than worsening situations. Patients will learn that anger is a secondary emotion and worked to identify some of the underlying feelings that may lead to anger.  Summary of Patient Progress:  Patient came into the group late and then left the group early. Patient did not participate when present beyond listening.   Therapeutic Modalities:   Cognitive Behavioral Therapy  Shellia Cleverly

## 2023-02-24 NOTE — Progress Notes (Signed)
   02/24/23 1100  Psych Admission Type (Psych Patients Only)  Admission Status Involuntary  Psychosocial Assessment  Patient Complaints Sadness  Eye Contact Fair  Facial Expression Sad  Affect Appropriate to circumstance  Speech Soft  Interaction Childlike  Motor Activity Slow  Appearance/Hygiene Unremarkable  Behavior Characteristics Cooperative;Appropriate to situation  Mood Pleasant;Sad  Thought Process  Coherency WDL  Content WDL  Delusions None reported or observed  Perception WDL  Hallucination None reported or observed  Judgment Poor  Confusion None  Danger to Self  Current suicidal ideation? Denies  Danger to Others  Danger to Others None reported or observed

## 2023-02-24 NOTE — Progress Notes (Signed)
   02/24/23 0600  15 Minute Checks  Location Bedroom  Visual Appearance Calm  Behavior Sleeping  Sleep (Behavioral Health Patients Only)  Calculate sleep? (Click Yes once per 24 hr at 0600 safety check) Yes  Documented sleep last 24 hours 8.25

## 2023-02-24 NOTE — Progress Notes (Signed)
   02/24/23 2010  Psych Admission Type (Psych Patients Only)  Admission Status Involuntary  Psychosocial Assessment  Patient Complaints Sadness  Eye Contact Fair  Facial Expression Flat  Affect Appropriate to circumstance;Sad  Speech Soft  Interaction Childlike;No initiation  Motor Activity Slow  Appearance/Hygiene Unremarkable  Behavior Characteristics Cooperative;Appropriate to situation  Mood Preoccupied;Sad  Thought Process  Coherency WDL  Content WDL  Delusions None reported or observed  Perception WDL  Hallucination None reported or observed  Judgment Poor  Confusion None  Danger to Self  Current suicidal ideation? Denies

## 2023-02-24 NOTE — BHH Group Notes (Signed)
Pt did not attend goals group. 

## 2023-02-24 NOTE — Plan of Care (Signed)
  Problem: Education: Goal: Utilization of techniques to improve thought processes will improve Outcome: Progressing Goal: Knowledge of the prescribed therapeutic regimen will improve Outcome: Progressing   

## 2023-02-24 NOTE — Group Note (Signed)
Date:  02/24/2023 Time:  9:35 PM  Group Topic/Focus:  Wrap-Up Group:   The focus of this group is to help patients review their daily goal of treatment and discuss progress on daily workbooks.    Participation Level:  Active  Participation Quality:  Appropriate, Sharing, and Supportive  Affect:  Appropriate  Cognitive:  Appropriate  Insight: Appropriate and Improving  Engagement in Group:  Developing/Improving, Engaged, and Supportive  Modes of Intervention:  Discussion, Socialization, and Support  Additional Comments:  The patient was very limited during group, didn't say much. The patient rated her day a 5 out of 10. The patient stated that she stayed in her room most of the day. However, the patient stated that she does want to interact more (goal). The patient stated something positive today was that she had a phone call from a friend and that made her happy.   Kennieth Francois 02/24/2023, 9:35 PM

## 2023-02-24 NOTE — Progress Notes (Signed)
[facility] MD Progress Note  02/24/2023 11:22 AM Yolanda [name]  MRN:  [id number] Principal Problem: Schizoaffective disorder, depressive type (HCC) Diagnosis: Principal Problem:   Schizoaffective disorder, depressive type (HCC) Active Problems:   GERD (gastroesophageal reflux disease)   Intellectual disability   Tobacco use disorder  Reason for admission: This is the first psychiatric admission in this [facility] in 12 years for this 47 AA female with an extensive hx of mental illnesses & probable polysubstance use disorders. She is admitted to the [facility] from the [facility] hospital with complain of worsening suicidal ideations with plan to stab herself. Per chart review, patient apparently reported at the ED that she has been depressed for a while & has not been taking her mental health medications. After medical evaluation.clearance, she was transferred to the [facility] for further psychiatric evaluation/treatments.   24 hr chart review: Sleep Hours last night: 8.25 hr last night as per nursing reports & documentation. Pt reports a poor night's sleep, but was in bed all day yesterday during the day. Nursing Concerns: None Behavioral episodes in the past 24 hrs: None Medication Compliance: Compliant  Vital Signs in the past 24 hrs: WNL PRN Medications in the past 24 hrs: None  Patient assessment note: During today's encounter, pt presents with an apathetic & depressed mood, attention to personal hygiene and grooming is poor, eye contact is non existent, garbled & sometimes incomprehensible requiring prompting for pt to repeat herself so that that she can be comprehended. Thought contents are organized and logical, and pt currently denies SI/HI/AVH or paranoia. There is no evidence of delusional thoughts.    Pt reports a poor sleep quality last night, but as per nursing, seep as good. She also, has not been very receptive to getting out of the room and coming to the day room for unit activities, and has been  interacting very minimally with her peers. She reports a good appetite, denies being in any physical pain, denies medication related side effects, states that she last moved her bowels yesterday. No TD/EPS type symptoms found on assessment, and pt denies any feelings of stiffness. AIMS: 0. We continue to await DSS placement in a safe and appropriate living arrangement outside of the hospital environment. Will continue to monitor. Continuing medications as listed below with no changes today. Nursing educated to continue to provide support and encouragement for pt to get out of her room.     Total Time spent with patient:  35 minutes  Past Psychiatric History: See H & P  Past Medical History:  Past Medical History:  Diagnosis Date   Bipolar affect, depressed (HCC)    Constipation 08/17/2022   Depression    Falls 07/21/2022   Fracture of femoral neck, right, closed (HCC) 01/17/2022   Herpes simplex 08/22/2017   Open fracture dislocation of right elbow joint 01/17/2022    Past Surgical History:  Procedure Laterality Date   NO PAST SURGERIES     SALPINGECTOMY     Family History: History reviewed. No pertinent family history.  Family Psychiatric  History: See H & P  Social History:  Social History   Substance and Sexual Activity  Alcohol Use Yes     Social History   Substance and Sexual Activity  Drug Use Yes   Types: Cocaine, Marijuana    Social History   Socioeconomic History   Marital status: Single    Spouse name: Not on file   Number of children: Not on file   Years  of education: Not on file   Highest education level: Not on file  Occupational History   Not on file  Tobacco Use   Smoking status: Every Day   Smokeless tobacco: Not on file  Substance and Sexual Activity   Alcohol use: Yes   Drug use: Yes    Types: Cocaine, Marijuana   Sexual activity: Yes  Other Topics Concern   Not on file  Social History Narrative   Not on file   Social Determinants of  Health   Financial Resource Strain: Low Risk  (09/12/2022)   Received from [facility]   Overall Financial Resource Strain (CARDIA)    Difficulty of Paying Living Expenses: Not hard at all  Food Insecurity: Patient Declined (12/20/2022)   Hunger Vital Sign    Worried About Running Out of Food in the Last Year: Patient declined    Ran Out of Food in the Last Year: Patient declined  Transportation Needs: No Transportation Needs (12/20/2022)   PRAPARE - Administrator, Civil Service (Medical): No    Lack of Transportation (Non-Medical): No  Physical Activity: Not on file  Stress: No Stress Concern Present (07/17/2022)   Received from [name] - Occupational Stress Questionnaire    Feeling of Stress : Not at all  Social Connections: Unknown (07/16/2022)   Received from [facility], [facility]   Social Network    Social Network: Not on file   Sleep: Good  Appetite:  Good  Current Medications: Current Facility-Administered Medications  Medication Dose Route Frequency Provider Last Rate Last Admin   acetaminophen (TYLENOL) tablet 650 mg  650 mg Oral Q6H PRN [name], NP   650 mg at 02/22/23 2214   alum & mag hydroxide-simeth (MAALOX/MYLANTA) 200-200-20 MG/5ML suspension 30 mL  30 mL Oral Q4H PRN [name], NP   30 mL at 01/28/23 1530   busPIRone (BUSPAR) tablet 15 mg  15 mg Oral BID [name], NP   15 mg at 02/24/23 0819   cyanocobalamin (VITAMIN B12) injection 1,000 mcg  1,000 mcg Intramuscular Q30 days [name], Nadir, MD   1,000 mcg at 02/02/23 1238   diphenhydrAMINE (BENADRYL) capsule 50 mg  50 mg Oral TID PRN [name], NP   50 mg at 01/15/23 1211   Or   diphenhydrAMINE (BENADRYL) injection 50 mg  50 mg Intramuscular TID PRN [name], NP       feeding supplement (ENSURE ENLIVE / ENSURE PLUS) liquid 237 mL  237 mL Oral TID BM Gian Ybarra, NP   237 mL at 02/24/23 0955    haloperidol (HALDOL) tablet 5 mg  5 mg Oral TID PRN [name], NP   5 mg at 01/15/23 1211   Or   haloperidol lactate (HALDOL) injection 5 mg  5 mg Intramuscular TID PRN [name], NP       hydrOXYzine (ATARAX) tablet 25 mg  25 mg Oral TID PRN [name], DO   25 mg at 02/20/23 2156   LORazepam (ATIVAN) tablet 2 mg  2 mg Oral TID PRN [name], NP   2 mg at 01/15/23 1211   Or   LORazepam (ATIVAN) injection 2 mg  2 mg Intramuscular TID PRN [name], NP       magnesium hydroxide (MILK OF MAGNESIA) suspension 30 mL  30 mL Oral Daily PRN [name], NP   30 mL at 02/15/23 1318  melatonin tablet 5 mg  5 mg Oral QHS Graclyn Lawther, NP   5 mg at 02/23/23 2120   nicotine (NICODERM CQ - dosed in mg/24 hours) patch 21 mg  21 mg Transdermal Daily [name], DO   21 mg at 02/22/23 8657   paliperidone (INVEGA) 24 hr tablet 6 mg  6 mg Oral Daily [name], NP   6 mg at 02/23/23 2120   pantoprazole (PROTONIX) EC tablet 40 mg  40 mg Oral Daily [name], NP   40 mg at 02/24/23 0819   polyethylene glycol (MIRALAX / GLYCOLAX) packet 17 g  17 g Oral Daily PRN [name], NP   17 g at 02/18/23 1624   sertraline (ZOLOFT) tablet 150 mg  150 mg Oral Daily Massengill, Harrold Donath, MD   150 mg at 02/24/23 0819   traZODone (DESYREL) tablet 50 mg  50 mg Oral QHS [name], NP   50 mg at 02/23/23 2120   Vitamin D (Ergocalciferol) (DRISDOL) 1.25 MG (50000 UNIT) capsule 50,000 Units  50,000 Units Oral Q7 days [name], NP   50,000 Units at 02/23/23 2010   Lab Results: No results found for this or any previous visit (from the past 48 hour(s)).  Blood Alcohol level:  Lab Results  Component Value Date   ETH <10 12/19/2022   ETH <10 11/21/2019   Metabolic Disorder Labs: Lab Results  Component Value Date   HGBA1C 4.4 (L) 12/21/2022   MPG 79.58 12/21/2022   No results found for: "PROLACTIN" Lab Results  Component Value Date   CHOL 218 (H) 12/21/2022   TRIG 81  12/21/2022   HDL 53 12/21/2022   CHOLHDL 4.1 12/21/2022   VLDL 16 12/21/2022   LDLCALC 149 (H) 12/21/2022   LDLCALC 110 (H) 03/13/2011   Physical Findings: AIMS: Facial and Oral Movements Muscles of Facial Expression: None, normal Lips and Perioral Area: None, normal Jaw: None, normal Tongue: None, normal,Extremity Movements Upper (arms, wrists, hands, fingers): None, normal Lower (legs, knees, ankles, toes): None, normal, Trunk Movements Neck, shoulders, hips: None, normal, Overall Severity Severity of abnormal movements (highest score from questions above): None, normal Incapacitation due to abnormal movements: None, normal Patient's awareness of abnormal movements (rate only patient's report): No Awareness, Dental Status Current problems with teeth and/or dentures?: No Does patient usually wear dentures?: No  CIWA:    COWS:    AIMS:0 Musculoskeletal: Strength & Muscle Tone: within normal limits Gait & Station: normal Patient leans: N/A  Psychiatric Specialty Exam:  Presentation  General Appearance:  Appropriate for Environment  Eye Contact: Good  Speech: Clear and Coherent  Speech Volume: Normal  Handedness: Right  Mood and Affect  Mood: Depressed  Affect: Congruent  Thought Process  Thought Processes: Coherent  Descriptions of Associations:Intact  Orientation:Partial  Thought Content:Logical  History of Schizophrenia/Schizoaffective disorder:Yes  Duration of Psychotic Symptoms:Greater than six months  Hallucinations:Hallucinations: None   Ideas of Reference:None  Suicidal Thoughts:Suicidal Thoughts: No   Homicidal Thoughts:Homicidal Thoughts: No   Sensorium  Memory: Immediate Good  Judgment: Poor  Insight: Poor  Executive Functions  Concentration: Fair  Attention Span: Fair  Recall: Fair  Fund of Knowledge: Poor  Language: Fair  Psychomotor Activity  Psychomotor Activity: Psychomotor Activity:  Normal   Assets  Assets: Resilience  Sleep  Sleep: Sleep: Good   Physical Exam: Physical Exam Vitals and nursing note reviewed.  Constitutional:      Appearance: Normal appearance.  HENT:     Nose: Nose normal.  Eyes:     Pupils: Pupils are equal, round, and reactive to light.  Pulmonary:     Effort: Pulmonary effort is normal.  Genitourinary:    Comments: Deferred Musculoskeletal:        General: Normal range of motion.     Cervical back: Normal range of motion.  Neurological:     Mental Status: She is alert and oriented to person, place, and time.    Review of Systems  Constitutional:  Negative for fever.  HENT:  Negative for hearing loss.   Eyes:  Negative for blurred vision.  Respiratory:  Negative for cough.   Cardiovascular:  Negative for chest pain.  Gastrointestinal:  Negative for heartburn.  Genitourinary:  Negative for dysuria.  Musculoskeletal:  Negative for myalgias.  Skin:  Negative for rash.  Neurological:  Negative for dizziness.  Psychiatric/Behavioral:  Positive for depression. Negative for hallucinations, memory loss, substance abuse and suicidal ideas. The patient is nervous/anxious and has insomnia.    Blood pressure 103/74, pulse 63, temperature 98.8 F (37.1 C), temperature source Oral, resp. rate 16, height 4\' 11"  (1.499 m), weight 63 kg, SpO2 95%. Body mass index is 28.05 kg/m.  Treatment Plan Summary: Daily contact with patient to assess and evaluate symptoms and progress in treatment and Medication management.    Still waiting on safe disposition as patient would not be able to live independently given cognitive impairment. Recommend assisted living.    No medication side effects.  Because of urinary incontinence, recommended adult diapers to be worn.   Principal/active diagnoses:  Principal Problem:   Schizoaffective disorder, depressive type (HCC) Active Problems:   GERD (gastroesophageal reflux disease)   Intellectual  disability   Tobacco use disorder (MOCA 16/30) moderate cognitive impairment probably related to moderate intellectual disability.  Plan:  -Continue Vitamin D 50.000 units weekly for bone health. -Continue Sertraline150 mg po Q daily for depression/anxiety -Continue Buspar 15 mg po bid for anxiety.  -Continue paliperidone 6 mg po qd for mood stabilization -Continue Trazodone 50 mg nightly for insomnia  -Continue Nicoderm 21 mg topically Q 24 hrs for nicotine withdrawal management. -Continue vitamin B12 1000 mcg IM weekly for 4 weeks then monthly -Continue Melatonin 5 mg nightly for sleep -Continue Protonix EC 40 mg p.o. daily for GERD -Continue MiraLAX 17 g p.o. PRN for constipation -Continue hydroxyzine 25 mg p.o. 3 times daily as needed for anxiety -Continue Ensure nutritional shakes TID in between meals  -Previously discontinued Hydroxyzine 50 mg nightly-hypotension in the mornings   Safety and Monitoring: Voluntary admission to inpatient psychiatric unit for safety, stabilization and treatment Daily contact with patient to assess and evaluate symptoms and progress in treatment Patient's case to be discussed in multi-disciplinary team meeting Observation Level : q15 minute checks Vital signs: q12 hours Precautions: Safety   Discharge Planning: Social work and case management to assist with discharge planning and identification of hospital follow-up needs prior to discharge Estimated LOS: Unknown at this time. Discharge Concerns: Need to establish a safety plan; Medication compliance and effectiveness Discharge Goals: Return home with outpatient referrals for mental health follow-up including medication management/psychotherapy No legal guardian, has payee  [name], NP, pmhnp 02/24/2023, 11:22 AM Patient ID: Larey Brick, female   DOB: [birth year], 47 y.o.   MRN: [id number]

## 2023-02-25 DIAGNOSIS — F251 Schizoaffective disorder, depressive type: Secondary | ICD-10-CM | POA: Diagnosis not present

## 2023-02-25 NOTE — Progress Notes (Signed)
Patient appears depressed. Patient denies SI/HI/AVH. Pt reports good sleep and good appetite. Patient complied with morning medication with no reported side effects. Patient remains safe on Q62min checks and contracts for safety.      02/25/23 0849  Psych Admission Type (Psych Patients Only)  Admission Status Involuntary  Psychosocial Assessment  Patient Complaints Sadness  Eye Contact Fair  Facial Expression Flat  Affect Anxious;Sad  Speech Soft  Interaction Childlike  Motor Activity Slow  Appearance/Hygiene Unremarkable  Behavior Characteristics Appropriate to situation;Cooperative  Mood Sad;Preoccupied  Thought Process  Coherency WDL  Content WDL  Delusions None reported or observed  Perception WDL  Hallucination None reported or observed  Judgment Poor  Confusion None  Danger to Self  Current suicidal ideation? Denies  Agreement Not to Harm Self Yes  Description of Agreement verbal  Danger to Others  Danger to Others None reported or observed

## 2023-02-25 NOTE — Progress Notes (Signed)
   02/25/23 0556  15 Minute Checks  Location Bedroom  Visual Appearance Calm  Behavior Sleeping  Sleep (Behavioral Health Patients Only)  Calculate sleep? (Click Yes once per 24 hr at 0600 safety check) Yes  Documented sleep last 24 hours 8.25

## 2023-02-25 NOTE — Progress Notes (Signed)
[facility] MD Progress Note  02/25/2023 1:34 PM Yolanda [name]  MRN:  [id number] Principal Problem: Schizoaffective disorder, depressive type (HCC) Diagnosis: Principal Problem:   Schizoaffective disorder, depressive type (HCC) Active Problems:   GERD (gastroesophageal reflux disease)   Intellectual disability   Tobacco use disorder  Reason for admission: This is the first psychiatric admission in this [facility] in 12 years for this 47 AA female with an extensive hx of mental illnesses & probable polysubstance use disorders. She is admitted to the [facility] from the [facility] hospital with complain of worsening suicidal ideations with plan to stab herself. Per chart review, patient apparently reported at the ED that she has been depressed for a while & has not been taking her mental health medications. After medical evaluation.clearance, she was transferred to the [facility] for further psychiatric evaluation/treatments.   24 hr chart review: Sleep Hours last night: 8.25 hr last night as per nursing reports & documentation.  Nursing Concerns: None Behavioral episodes in the past 24 hrs: None Medication Compliance: Compliant  Vital Signs in the past 24 hrs: WNL PRN Medications in the past 24 hrs: None  Patient assessment note: During today's encounter, pt presents with is continuing to present with an apathetic & depressed mood, attention to personal hygiene and grooming remains poor, eye contact is minimal. Speech remains garbled & sometimes incomprehensible requiring prompting for pt to repeat herself so that that she can be comprehended which seems to be her baseline. Thought contents are organized and logical, and pt continues to deny SI/HI/AVH or paranoia. There is no evidence of delusional thoughts.    Pt reports a poor sleep quality last night, but as per nursing, she slept for at least 8 hrs last night. She is in bed for most of the day time and if not sleeping at night, this must be why. She is being  provided with positive verbal reinforcements to get out of the room and indulge in unit activities, but is preferring to stay in bed, even when room lock out orders are in place. She reports a poor appetite today, stated that she did not eat breakfast due to not feeling hungry, but ate some lunch. She denies being in any physical pain, denies medication related side effects, denies having issues with bowel movements. No TD/EPS type symptoms found on assessment, and pt denies any feelings of stiffness. AIMS: 0.   We are continuing to await DSS placement in a safe and appropriate living arrangement outside of the hospital environment. Will continue to monitor. Continuing medications as listed below with no changes today. Nursing educated to continue to provide support and encouragement for pt to get out of her room & participate in unit activities. Continuing all medications as listed below with no changes.     Total Time spent with patient:  45 minutes  Past Psychiatric History: See H & P  Past Medical History:  Past Medical History:  Diagnosis Date   Bipolar affect, depressed (HCC)    Constipation 08/17/2022   Depression    Falls 07/21/2022   Fracture of femoral neck, right, closed (HCC) 01/17/2022   Herpes simplex 08/22/2017   Open fracture dislocation of right elbow joint 01/17/2022    Past Surgical History:  Procedure Laterality Date   NO PAST SURGERIES     SALPINGECTOMY     Family History: History reviewed. No pertinent family history.  Family Psychiatric  History: See H & P  Social History:  Social History   Substance  and Sexual Activity  Alcohol Use Yes     Social History   Substance and Sexual Activity  Drug Use Yes   Types: Cocaine, Marijuana    Social History   Socioeconomic History   Marital status: Single    Spouse name: Not on file   Number of children: Not on file   Years of education: Not on file   Highest education level: Not on file  Occupational  History   Not on file  Tobacco Use   Smoking status: Every Day   Smokeless tobacco: Not on file  Substance and Sexual Activity   Alcohol use: Yes   Drug use: Yes    Types: Cocaine, Marijuana   Sexual activity: Yes  Other Topics Concern   Not on file  Social History Narrative   Not on file   Social Determinants of Health   Financial Resource Strain: Low Risk  (09/12/2022)   Received from [facility], [facility]   Overall Financial Resource Strain (CARDIA)    Difficulty of Paying Living Expenses: Not hard at all  Food Insecurity: Patient Declined (12/20/2022)   Hunger Vital Sign    Worried About Running Out of Food in the Last Year: Patient declined    Ran Out of Food in the Last Year: Patient declined  Transportation Needs: No Transportation Needs (12/20/2022)   PRAPARE - Administrator, Civil Service (Medical): No    Lack of Transportation (Non-Medical): No  Physical Activity: Not on file  Stress: No Stress Concern Present (07/17/2022)   Received from [facility], [facility] - Occupational Stress Questionnaire    Feeling of Stress : Not at all  Social Connections: Unknown (07/16/2022)   Received from [facility], [facility]   Social Network    Social Network: Not on file   Sleep: Good  Appetite:  Good  Current Medications: Current Facility-Administered Medications  Medication Dose Route Frequency Provider Last Rate Last Admin   acetaminophen (TYLENOL) tablet 650 mg  650 mg Oral Q6H PRN [name], NP   650 mg at 02/22/23 2214   alum & mag hydroxide-simeth (MAALOX/MYLANTA) 200-200-20 MG/5ML suspension 30 mL  30 mL Oral Q4H PRN [name], NP   30 mL at 01/28/23 1530   busPIRone (BUSPAR) tablet 15 mg  15 mg Oral BID [name], NP   15 mg at 02/25/23 0813   cyanocobalamin (VITAMIN B12) injection 1,000 mcg  1,000 mcg Intramuscular Q30 days [name], Nadir, MD   1,000 mcg at 02/02/23 1238    diphenhydrAMINE (BENADRYL) capsule 50 mg  50 mg Oral TID PRN [name], NP   50 mg at 01/15/23 1211   Or   diphenhydrAMINE (BENADRYL) injection 50 mg  50 mg Intramuscular TID PRN [name], NP       feeding supplement (ENSURE ENLIVE / ENSURE PLUS) liquid 237 mL  237 mL Oral TID BM Athanasius Kesling, NP   237 mL at 02/25/23 0956   haloperidol (HALDOL) tablet 5 mg  5 mg Oral TID PRN [name], NP   5 mg at 01/15/23 1211   Or   haloperidol lactate (HALDOL) injection 5 mg  5 mg Intramuscular TID PRN [name], NP       hydrOXYzine (ATARAX) tablet 25 mg  25 mg Oral TID PRN [name], DO   25 mg at 02/20/23 2156   LORazepam (ATIVAN) tablet 2 mg  2 mg Oral TID  PRN [name], NP   2 mg at 01/15/23 1211   Or   LORazepam (ATIVAN) injection 2 mg  2 mg Intramuscular TID PRN [name], NP       magnesium hydroxide (MILK OF MAGNESIA) suspension 30 mL  30 mL Oral Daily PRN [name], NP   30 mL at 02/15/23 1318   melatonin tablet 5 mg  5 mg Oral QHS Lindzie Boxx, NP   5 mg at 02/24/23 2140   nicotine (NICODERM CQ - dosed in mg/24 hours) patch 21 mg  21 mg Transdermal Daily [name], DO   21 mg at 02/25/23 1610   paliperidone (INVEGA) 24 hr tablet 6 mg  6 mg Oral Daily [name], NP   6 mg at 02/24/23 1957   pantoprazole (PROTONIX) EC tablet 40 mg  40 mg Oral Daily [name], NP   40 mg at 02/25/23 0813   polyethylene glycol (MIRALAX / GLYCOLAX) packet 17 g  17 g Oral Daily PRN [name], NP   17 g at 02/18/23 1624   sertraline (ZOLOFT) tablet 150 mg  150 mg Oral Daily Massengill, Harrold Donath, MD   150 mg at 02/25/23 0813   traZODone (DESYREL) tablet 50 mg  50 mg Oral QHS [name], NP   50 mg at 02/24/23 2140   Vitamin D (Ergocalciferol) (DRISDOL) 1.25 MG (50000 UNIT) capsule 50,000 Units  50,000 Units Oral Q7 days [name], NP   50,000 Units at 02/23/23 2010   Lab Results: No results found for this or any previous visit (from the past 48  hour(s)).  Blood Alcohol level:  Lab Results  Component Value Date   ETH <10 12/19/2022   ETH <10 11/21/2019   Metabolic Disorder Labs: Lab Results  Component Value Date   HGBA1C 4.4 (L) 12/21/2022   MPG 79.58 12/21/2022   No results found for: "PROLACTIN" Lab Results  Component Value Date   CHOL 218 (H) 12/21/2022   TRIG 81 12/21/2022   HDL 53 12/21/2022   CHOLHDL 4.1 12/21/2022   VLDL 16 12/21/2022   LDLCALC 149 (H) 12/21/2022   LDLCALC 110 (H) 03/13/2011   Physical Findings: AIMS: Facial and Oral Movements Muscles of Facial Expression: None, normal Lips and Perioral Area: None, normal Jaw: None, normal Tongue: None, normal,Extremity Movements Upper (arms, wrists, hands, fingers): None, normal Lower (legs, knees, ankles, toes): None, normal, Trunk Movements Neck, shoulders, hips: None, normal, Overall Severity Severity of abnormal movements (highest score from questions above): None, normal Incapacitation due to abnormal movements: None, normal Patient's awareness of abnormal movements (rate only patient's report): No Awareness, Dental Status Current problems with teeth and/or dentures?: No Does patient usually wear dentures?: No  CIWA:    COWS:    AIMS:0 Musculoskeletal: Strength & Muscle Tone: within normal limits Gait & Station: normal Patient leans: N/A  Psychiatric Specialty Exam:  Presentation  General Appearance:  Disheveled  Eye Contact: Fair  Speech: Garbled  Speech Volume: Decreased  Handedness: Right  Mood and Affect  Mood: Depressed  Affect: Congruent  Thought Process  Thought Processes: Coherent  Descriptions of Associations:Intact  Orientation:Partial  Thought Content:Logical  History of Schizophrenia/Schizoaffective disorder:Yes  Duration of Psychotic Symptoms:Greater than six months  Hallucinations:Hallucinations: None   Ideas of Reference:None  Suicidal Thoughts:Suicidal Thoughts: No   Homicidal  Thoughts:Homicidal Thoughts: No   Sensorium  Memory: Immediate Good  Judgment: Fair  Insight: Fair  Executive Functions  Concentration: Fair  Attention Span: Fair  Recall: Poor  Fund of Knowledge: Poor  Language: Fair  Lexicographer Activity: Psychomotor Activity: Normal   Assets  Assets: Resilience  Sleep  Sleep: Sleep: Good   Physical Exam: Physical Exam Vitals and nursing note reviewed.  Constitutional:      Appearance: Normal appearance.  HENT:     Nose: Nose normal.  Eyes:     Pupils: Pupils are equal, round, and reactive to light.  Pulmonary:     Effort: Pulmonary effort is normal.  Genitourinary:    Comments: Deferred Musculoskeletal:        General: Normal range of motion.     Cervical back: Normal range of motion.  Neurological:     Mental Status: She is alert and oriented to person, place, and time.    Review of Systems  Constitutional:  Negative for fever.  HENT:  Negative for hearing loss.   Eyes:  Negative for blurred vision.  Respiratory:  Negative for cough.   Cardiovascular:  Negative for chest pain.  Gastrointestinal:  Negative for heartburn.  Genitourinary:  Negative for dysuria.  Musculoskeletal:  Negative for myalgias.  Skin:  Negative for rash.  Neurological:  Negative for dizziness.  Psychiatric/Behavioral:  Positive for depression. Negative for hallucinations, memory loss, substance abuse and suicidal ideas. The patient is nervous/anxious and has insomnia.    Blood pressure 101/61, pulse (!) 57, temperature 98.8 F (37.1 C), temperature source Oral, resp. rate 16, height 4\' 11"  (1.499 m), weight 63 kg, SpO2 99%. Body mass index is 28.05 kg/m.  Treatment Plan Summary: Daily contact with patient to assess and evaluate symptoms and progress in treatment and Medication management.    Still waiting on safe disposition as patient would not be able to live independently given cognitive impairment.  Recommend assisted living.    No medication side effects.  Because of urinary incontinence, recommended adult diapers to be worn.   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  Plan:  -Continue Vitamin D 50.000 units weekly for bone health. -Continue Sertraline150 mg po Q daily for depression/anxiety -Continue Buspar 15 mg po bid for anxiety.  -Continue paliperidone 6 mg po qd for mood stabilization -Continue Trazodone 50 mg nightly for insomnia  -Continue Nicoderm 21 mg topically Q 24 hrs for nicotine withdrawal management. -Continue vitamin B12 1000 mcg IM weekly for 4 weeks then monthly -Continue Melatonin 5 mg nightly for sleep -Continue Protonix EC 40 mg p.o. daily for GERD -Continue MiraLAX 17 g p.o. PRN for constipation -Continue hydroxyzine 25 mg p.o. 3 times daily as needed for anxiety -Continue Ensure nutritional shakes TID in between meals  -Previously discontinued Hydroxyzine 50 mg nightly-hypotension in the mornings   Safety and Monitoring: Voluntary admission to inpatient psychiatric unit for safety, stabilization and treatment Daily contact with patient to assess and evaluate symptoms and progress in treatment Patient's case to be discussed in multi-disciplinary team meeting Observation Level : q15 minute checks Vital signs: q12 hours Precautions: Safety   Discharge Planning: Social work and case management to assist with discharge planning and identification of hospital follow-up needs prior to discharge Estimated LOS: Unknown at this time. Discharge Concerns: Need to establish a safety plan; Medication compliance and effectiveness Discharge Goals: Return home with outpatient referrals for mental health  follow-up including medication management/psychotherapy No legal guardian, has  payee  [name], NP, pmhnp 02/25/2023, 1:34 PM Patient ID: Yolanda [name], female   DOB: [birth year], 46 y.o.   MRN: [id number]

## 2023-02-25 NOTE — Group Note (Signed)
Date:  02/25/2023 Time:  6:09 PM  Group Topic/Focus:  Goals Group:   The focus of this group is to help patients establish daily goals to achieve during treatment and discuss how the patient can incorporate goal setting into their daily lives to aide in recovery.    Participation Level:  Did Not Attend    Donell Beers 02/25/2023, 6:09 PM

## 2023-02-25 NOTE — Plan of Care (Signed)
  Problem: Education: Goal: Utilization of techniques to improve thought processes will improve Outcome: Progressing Goal: Knowledge of the prescribed therapeutic regimen will improve Outcome: Progressing   Problem: Activity: Goal: Interest or engagement in leisure activities will improve Outcome: Progressing Goal: Imbalance in normal sleep/wake cycle will improve Outcome: Progressing   Problem: Coping: Goal: Coping ability will improve Outcome: Progressing Goal: Will verbalize feelings Outcome: Progressing   Problem: Health Behavior/Discharge Planning: Goal: Ability to make decisions will improve Outcome: Progressing Goal: Compliance with therapeutic regimen will improve Outcome: Progressing   Problem: Role Relationship: Goal: Will demonstrate positive changes in social behaviors and relationships Outcome: Progressing   Problem: Safety: Goal: Ability to disclose and discuss suicidal ideas will improve Outcome: Progressing Goal: Ability to identify and utilize support systems that promote safety will improve Outcome: Progressing   Problem: Self-Concept: Goal: Will verbalize positive feelings about self Outcome: Progressing Goal: Level of anxiety will decrease Outcome: Progressing   Problem: Education: Goal: Utilization of techniques to improve thought processes will improve Outcome: Progressing Goal: Knowledge of the prescribed therapeutic regimen will improve Outcome: Progressing   Problem: Activity: Goal: Interest or engagement in leisure activities will improve Outcome: Progressing Goal: Imbalance in normal sleep/wake cycle will improve Outcome: Progressing   Problem: Coping: Goal: Coping ability will improve Outcome: Progressing Goal: Will verbalize feelings Outcome: Progressing   Problem: Health Behavior/Discharge Planning: Goal: Ability to make decisions will improve Outcome: Progressing Goal: Compliance with therapeutic regimen will  improve Outcome: Progressing   Problem: Role Relationship: Goal: Will demonstrate positive changes in social behaviors and relationships Outcome: Progressing   Problem: Safety: Goal: Ability to disclose and discuss suicidal ideas will improve Outcome: Progressing Goal: Ability to identify and utilize support systems that promote safety will improve Outcome: Progressing   Problem: Self-Concept: Goal: Will verbalize positive feelings about self Outcome: Progressing Goal: Level of anxiety will decrease Outcome: Progressing   Problem: Education: Goal: Ability to make informed decisions regarding treatment will improve Outcome: Progressing   Problem: Coping: Goal: Coping ability will improve Outcome: Progressing   Problem: Health Behavior/Discharge Planning: Goal: Identification of resources available to assist in meeting health care needs will improve Outcome: Progressing   Problem: Medication: Goal: Compliance with prescribed medication regimen will improve Outcome: Progressing   Problem: Self-Concept: Goal: Ability to disclose and discuss suicidal ideas will improve Outcome: Progressing Goal: Will verbalize positive feelings about self Outcome: Progressing   Problem: Education: Goal: Knowledge of [facility] Education information/materials will improve Outcome: Progressing Goal: Emotional status will improve Outcome: Progressing Goal: Mental status will improve Outcome: Progressing Goal: Verbalization of understanding the information provided will improve Outcome: Progressing   Problem: Activity: Goal: Interest or engagement in activities will improve Outcome: Progressing Goal: Sleeping patterns will improve Outcome: Progressing   Problem: Coping: Goal: Ability to verbalize frustrations and anger appropriately will improve Outcome: Progressing Goal: Ability to demonstrate self-control will improve Outcome: Progressing   Problem: Health  Behavior/Discharge Planning: Goal: Identification of resources available to assist in meeting health care needs will improve Outcome: Progressing Goal: Compliance with treatment plan for underlying cause of condition will improve Outcome: Progressing   Problem: Physical Regulation: Goal: Ability to maintain clinical measurements within normal limits will improve Outcome: Progressing   Problem: Safety: Goal: Periods of time without injury will increase Outcome: Progressing

## 2023-02-25 NOTE — Group Note (Signed)
Recreation Therapy Group Note   Group Topic:Stress Management  Group Date: 02/25/2023 Start Time: 0930 End Time: 1000 Facilitators: Laury Huizar-McCall, LRT,CTRS Location: [address] Dayroom   Goal Area(s) Addresses:  Patient will actively participate in stress management techniques presented during session.  Patient will successfully identify benefit of practicing stress management post d/c.   Group Description:  Guided Imagery. LRT provided education, instruction, and demonstration on practice of visualization via guided imagery. Patient was asked to participate in the technique introduced during session. LRT debriefed including topics of mindfulness, stress management and specific scenarios each patient could use these techniques. Patients were given suggestions of ways to access scripts post d/c and encouraged to explore Youtube and other apps available on smartphones, tablets, and computers.   Clinical Observations/Individualized Feedback: Unable to conduct group session due to maintenance being completed in dayroom.     Plan: Continue to engage patient in RT group sessions 2-3x/week.   Mustafa Potts-McCall, LRT,CTRS 02/25/2023 11:51 AM

## 2023-02-25 NOTE — Progress Notes (Signed)
    02/25/23 2100  Psych Admission Type (Psych Patients Only)  Admission Status Involuntary  Psychosocial Assessment  Patient Complaints Sadness  Eye Contact Fair  Facial Expression Flat  Affect Appropriate to circumstance;Sad  Speech Soft  Interaction Childlike;No initiation  Motor Activity Slow  Appearance/Hygiene Unremarkable  Behavior Characteristics Cooperative;Appropriate to situation  Mood Preoccupied;Sad  Thought Process  Coherency WDL  Content WDL  Delusions None reported or observed  Perception Depersonalization  Hallucination None reported or observed  Judgment Poor  Confusion None  Danger to Self  Current suicidal ideation? Denies

## 2023-02-25 NOTE — Plan of Care (Signed)
  Problem: Activity: Goal: Interest or engagement in leisure activities will improve Outcome: Progressing   Problem: Coping: Goal: Will verbalize feelings Outcome: Progressing   

## 2023-02-26 DIAGNOSIS — F251 Schizoaffective disorder, depressive type: Secondary | ICD-10-CM | POA: Diagnosis not present

## 2023-02-26 NOTE — Group Note (Signed)
[facility] LCSW Group Therapy Note   Group Date: 02/26/2023 Start Time: 1100 End Time: 1140   Type of Therapy/Topic:  Group Therapy:  Emotion Regulation  Participation Level:  None   Mood:  Description of Group:    The purpose of this group is to assist patients in learning to regulate negative emotions and experience positive emotions. Patients will be guided to discuss ways in which they have been vulnerable to their negative emotions. These vulnerabilities will be juxtaposed with experiences of positive emotions or situations, and patients challenged to use positive emotions to combat negative ones. Special emphasis will be placed on coping with negative emotions in conflict situations, and patients will process healthy conflict resolution skills.  Therapeutic Goals: Patient will identify two positive emotions or experiences to reflect on in order to balance out negative emotions:  Patient will label two or more emotions that they find the most difficult to experience:  Patient will be able to demonstrate positive conflict resolution skills through discussion or role plays:   Summary of Patient Progress:   Patient was present for entire group session, but did not participate.    Therapeutic Modalities:   Cognitive Behavioral Therapy Feelings Identification Dialectical Behavioral Therapy   Starleen Arms, LCSW

## 2023-02-26 NOTE — Group Note (Signed)
Recreation Therapy Group Note   Group Topic:Animal Assisted Therapy   Group Date: 02/26/2023 Start Time: 0945 End Time: 1030 Facilitators: Zenya Hickam-McCall, LRT,CTRS Location: [address] Dayroom   Animal-Assisted Activity (AAA) Program Checklist/Progress Notes Patient Eligibility Criteria Checklist & Daily Group note for Rec Tx Intervention  AAA/T Program Assumption of Risk Form signed by Patient/ or Parent Legal Guardian Yes  Patient understands his/her participation is voluntary Yes   Affect/Mood: N/A   Participation Level: Did not attend    Clinical Observations/Individualized Feedback:     Plan: Continue to engage patient in RT group sessions 2-3x/week.   Lorrine Killilea-McCall, LRT,CTRS 02/26/2023 12:58 PM

## 2023-02-26 NOTE — Plan of Care (Signed)
  Problem: Activity: Goal: Interest or engagement in leisure activities will improve Outcome: Progressing   Problem: Coping: Goal: Will verbalize feelings Outcome: Progressing   Problem: Self-Concept: Goal: Level of anxiety will decrease Outcome: Progressing

## 2023-02-26 NOTE — Plan of Care (Signed)
  Problem: Education: Goal: Utilization of techniques to improve thought processes will improve Outcome: Progressing Goal: Knowledge of the prescribed therapeutic regimen will improve Outcome: Progressing   Problem: Activity: Goal: Interest or engagement in leisure activities will improve Outcome: Progressing Goal: Imbalance in normal sleep/wake cycle will improve Outcome: Progressing   Problem: Coping: Goal: Coping ability will improve Outcome: Progressing Goal: Will verbalize feelings Outcome: Progressing   Problem: Health Behavior/Discharge Planning: Goal: Ability to make decisions will improve Outcome: Progressing Goal: Compliance with therapeutic regimen will improve Outcome: Progressing   Problem: Role Relationship: Goal: Will demonstrate positive changes in social behaviors and relationships Outcome: Progressing   Problem: Safety: Goal: Ability to disclose and discuss suicidal ideas will improve Outcome: Progressing Goal: Ability to identify and utilize support systems that promote safety will improve Outcome: Progressing

## 2023-02-26 NOTE — Progress Notes (Signed)
    02/26/23 2103  Psych Admission Type (Psych Patients Only)  Admission Status Involuntary  Psychosocial Assessment  Patient Complaints Sadness  Eye Contact Fair  Facial Expression Flat  Affect Depressed;Sad  Speech Soft  Interaction Childlike;Forwards little  Motor Activity Slow  Appearance/Hygiene Unremarkable  Behavior Characteristics Cooperative;Appropriate to situation  Mood Preoccupied;Sad  Thought Process  Coherency WDL  Content WDL  Delusions None reported or observed  Perception Depersonalization  Hallucination None reported or observed  Judgment Poor  Confusion None  Danger to Self  Current suicidal ideation? Denies

## 2023-02-26 NOTE — Progress Notes (Signed)
   02/26/23 0900  Psych Admission Type (Psych Patients Only)  Admission Status Involuntary  Psychosocial Assessment  Patient Complaints Sadness;Crying spells  Eye Contact Brief  Facial Expression Sad  Affect Depressed;Sad;Preoccupied  Speech Soft  Interaction Cautious;Childlike  Motor Activity Slow;Tremors;Unsteady  Appearance/Hygiene Unremarkable  Behavior Characteristics Cooperative  Mood Preoccupied;Sad  Thought Process  Coherency WDL  Content WDL  Delusions None reported or observed  Perception WDL  Hallucination None reported or observed  Judgment Poor  Confusion None  Danger to Self  Current suicidal ideation? Denies  Agreement Not to Harm Self Yes  Description of Agreement verbal  Danger to Others  Danger to Others None reported or observed

## 2023-02-26 NOTE — [facility] Group Notes (Signed)
Spiritual care group on grief and loss facilitated by Chaplain Katy Claussen, [facility]  Group Goal: Support / Education around grief and loss  Members engage in facilitated group support and psycho-social education.  Group Description:  Following introductions and group rules, group members engaged in facilitated group dialogue and support around topic of loss, with particular support around experiences of loss in their lives. Group Identified types of loss (relationships / self / things) and identified patterns, circumstances, and changes that precipitate losses. Reflected on thoughts / feelings around loss, normalized grief responses, and recognized variety in grief experience. Group encouraged individual reflection on safe space and on the coping skills that they are already utilizing.  Group drew on Adlerian / Rogerian and narrative framework  Patient Progress: Did not attend.  

## 2023-02-26 NOTE — Progress Notes (Signed)
   02/26/23 0615  15 Minute Checks  Location Bedroom  Visual Appearance Calm  Behavior Sleeping  Sleep (Behavioral Health Patients Only)  Calculate sleep? (Click Yes once per 24 hr at 0600 safety check) Yes  Documented sleep last 24 hours 10

## 2023-02-26 NOTE — Progress Notes (Signed)
[facility] MD Progress Note  02/26/2023 2:38 PM Yolanda [name]  MRN:  [id number] Principal Problem: Schizoaffective disorder, depressive type (HCC) Diagnosis: Principal Problem:   Schizoaffective disorder, depressive type (HCC) Active Problems:   GERD (gastroesophageal reflux disease)   Intellectual disability   Tobacco use disorder  Reason for admission: This is the first psychiatric admission in this [facility] in 12 years for this 47 AA female with an extensive hx of mental illnesses & probable polysubstance use disorders. She is admitted to the [facility] from the [facility] hospital with complain of worsening suicidal ideations with plan to stab herself. Per chart review, patient apparently reported at the ED that she has been depressed for a while & has not been taking her mental health medications. After medical evaluation.clearance, she was transferred to the [facility] for further psychiatric evaluation/treatments.   24 hr chart review: Sleep Hours last night: 10 hr last night as per nursing reports & documentation.  Nursing Concerns: None Behavioral episodes in the past 24 hrs: None Medication Compliance: Compliant  Vital Signs in the past 24 hrs: WNL PRN Medications in the past 24 hrs: None  Patient assessment note: On assessment today, the pt reports that their mood is depressed related to still being hospitalized. Reports that anxiety is moderate Sleep is average Appetite is good Concentration is good   Energy level is low  Denies suicidal thoughts. Denies suicidal intent and plan.  Denies having any HI.  Denies having psychotic symptoms.   Denies having side effects to current psychiatric medications.  We discussed keeping meds same with no changes today. She denies being in any physical pain, denies medication related side effects, denies having issues with bowel movements. No TD/EPS type symptoms found on assessment, and pt denies any feelings of stiffness. AIMS: 0.   Discussed the  following psychosocial stressors: Continuous hospitalization.  We are continuing to await DSS placement in a safe and appropriate living arrangement outside of the hospital environment. Will continue to monitor. Continuing medications as listed below with no changes today. Nursing educated to continue to provide support and encouragement for pt to get out of her room & participate in unit activities. Continuing all medications as listed below with no changes.     Total Time spent with patient:  45 minutes  Past Psychiatric History: See H & P  Past Medical History:  Past Medical History:  Diagnosis Date   Bipolar affect, depressed (HCC)    Constipation 08/17/2022   Depression    Falls 07/21/2022   Fracture of femoral neck, right, closed (HCC) 01/17/2022   Herpes simplex 08/22/2017   Open fracture dislocation of right elbow joint 01/17/2022    Past Surgical History:  Procedure Laterality Date   NO PAST SURGERIES     SALPINGECTOMY     Family History: History reviewed. No pertinent family history.  Family Psychiatric  History: See H & P  Social History:  Social History   Substance and Sexual Activity  Alcohol Use Yes     Social History   Substance and Sexual Activity  Drug Use Yes   Types: Cocaine, Marijuana    Social History   Socioeconomic History   Marital status: Single    Spouse name: Not on file   Number of children: Not on file   Years of education: Not on file   Highest education level: Not on file  Occupational History   Not on file  Tobacco Use   Smoking status: Every Day  Smokeless tobacco: Not on file  Substance and Sexual Activity   Alcohol use: Yes   Drug use: Yes    Types: Cocaine, Marijuana   Sexual activity: Yes  Other Topics Concern   Not on file  Social History Narrative   Not on file   Social Determinants of Health   Financial Resource Strain: Low Risk  (09/12/2022)   Received from [facility], [facility]   Overall Financial  Resource Strain (CARDIA)    Difficulty of Paying Living Expenses: Not hard at all  Food Insecurity: Patient Declined (12/20/2022)   Hunger Vital Sign    Worried About Running Out of Food in the Last Year: Patient declined    Ran Out of Food in the Last Year: Patient declined  Transportation Needs: No Transportation Needs (12/20/2022)   PRAPARE - Administrator, Civil Service (Medical): No    Lack of Transportation (Non-Medical): No  Physical Activity: Not on file  Stress: No Stress Concern Present (07/17/2022)   Received from [facility], [facility] - Occupational Stress Questionnaire    Feeling of Stress : Not at all  Social Connections: Unknown (07/16/2022)   Received from [facility], [facility]   Social Network    Social Network: Not on file   Sleep: Good  Appetite:  Good  Current Medications: Current Facility-Administered Medications  Medication Dose Route Frequency Provider Last Rate Last Admin   acetaminophen (TYLENOL) tablet 650 mg  650 mg Oral Q6H PRN [name], NP   650 mg at 02/25/23 2100   alum & mag hydroxide-simeth (MAALOX/MYLANTA) 200-200-20 MG/5ML suspension 30 mL  30 mL Oral Q4H PRN [name], NP   30 mL at 01/28/23 1530   busPIRone (BUSPAR) tablet 15 mg  15 mg Oral BID [name], NP   15 mg at 02/26/23 0757   cyanocobalamin (VITAMIN B12) injection 1,000 mcg  1,000 mcg Intramuscular Q30 days Sarita Bottom, MD   1,000 mcg at 02/02/23 1238   diphenhydrAMINE (BENADRYL) capsule 50 mg  50 mg Oral TID PRN [name], NP   50 mg at 01/15/23 1211   Or   diphenhydrAMINE (BENADRYL) injection 50 mg  50 mg Intramuscular TID PRN [name], NP       feeding supplement (ENSURE ENLIVE / ENSURE PLUS) liquid 237 mL  237 mL Oral TID BM Jenetta Wease, NP   237 mL at 02/26/23 1038   haloperidol (HALDOL) tablet 5 mg  5 mg Oral TID PRN [name], NP   5 mg at 01/15/23 1211   Or   haloperidol lactate  (HALDOL) injection 5 mg  5 mg Intramuscular TID PRN [name], NP       hydrOXYzine (ATARAX) tablet 25 mg  25 mg Oral TID PRN [name], DO   25 mg at 02/20/23 2156   LORazepam (ATIVAN) tablet 2 mg  2 mg Oral TID PRN [name], NP   2 mg at 01/15/23 1211   Or   LORazepam (ATIVAN) injection 2 mg  2 mg Intramuscular TID PRN [name], NP       magnesium hydroxide (MILK OF MAGNESIA) suspension 30 mL  30 mL Oral Daily PRN [name], NP   30 mL at 02/15/23 1318   melatonin tablet 5 mg  5 mg Oral QHS Eileene Kisling, NP   5 mg at 02/25/23 2058   nicotine (NICODERM CQ - dosed in mg/24 hours) patch 21  mg  21 mg Transdermal Daily [name], DO   21 mg at 02/26/23 0757   paliperidone (INVEGA) 24 hr tablet 6 mg  6 mg Oral Daily [name], NP   6 mg at 02/25/23 2058   pantoprazole (PROTONIX) EC tablet 40 mg  40 mg Oral Daily [name], NP   40 mg at 02/26/23 0757   polyethylene glycol (MIRALAX / GLYCOLAX) packet 17 g  17 g Oral Daily PRN [name], NP   17 g at 02/18/23 1624   sertraline (ZOLOFT) tablet 150 mg  150 mg Oral Daily Massengill, Harrold Donath, MD   150 mg at 02/26/23 0757   traZODone (DESYREL) tablet 50 mg  50 mg Oral QHS [name], NP   50 mg at 02/25/23 2058   Vitamin D (Ergocalciferol) (DRISDOL) 1.25 MG (50000 UNIT) capsule 50,000 Units  50,000 Units Oral Q7 days [name], NP   50,000 Units at 02/23/23 2010   Lab Results: No results found for this or any previous visit (from the past 48 hour(s)).  Blood Alcohol level:  Lab Results  Component Value Date   ETH <10 12/19/2022   ETH <10 11/21/2019   Metabolic Disorder Labs: Lab Results  Component Value Date   HGBA1C 4.4 (L) 12/21/2022   MPG 79.58 12/21/2022   No results found for: "PROLACTIN" Lab Results  Component Value Date   CHOL 218 (H) 12/21/2022   TRIG 81 12/21/2022   HDL 53 12/21/2022   CHOLHDL 4.1 12/21/2022   VLDL 16 12/21/2022   LDLCALC 149 (H) 12/21/2022   LDLCALC 110 (H)  03/13/2011   Physical Findings: AIMS: Facial and Oral Movements Muscles of Facial Expression: None, normal Lips and Perioral Area: None, normal Jaw: None, normal Tongue: None, normal,Extremity Movements Upper (arms, wrists, hands, fingers): None, normal Lower (legs, knees, ankles, toes): None, normal, Trunk Movements Neck, shoulders, hips: None, normal, Overall Severity Severity of abnormal movements (highest score from questions above): None, normal Incapacitation due to abnormal movements: None, normal Patient's awareness of abnormal movements (rate only patient's report): No Awareness, Dental Status Current problems with teeth and/or dentures?: No Does patient usually wear dentures?: No  CIWA:    COWS:    AIMS:0 Musculoskeletal: Strength & Muscle Tone: within normal limits Gait & Station: normal Patient leans: N/A  Psychiatric Specialty Exam:  Presentation  General Appearance:  Fairly Groomed  Eye Contact: Fair  Speech: Clear and Coherent  Speech Volume: Normal  Handedness: Right  Mood and Affect  Mood: Depressed; Anxious  Affect: Congruent  Thought Process  Thought Processes: Coherent  Descriptions of Associations:Intact  Orientation:Partial  Thought Content:Logical  History of Schizophrenia/Schizoaffective disorder:Yes  Duration of Psychotic Symptoms:Greater than six months  Hallucinations:Hallucinations: None   Ideas of Reference:None  Suicidal Thoughts:Suicidal Thoughts: No   Homicidal Thoughts:Homicidal Thoughts: No   Sensorium  Memory: Immediate Good  Judgment: Poor  Insight: Poor  Executive Functions  Concentration: Fair  Attention Span: Fair  Recall: Fair  Fund of Knowledge: Poor  Language: Fair  Psychomotor Activity  Psychomotor Activity: Psychomotor Activity: Normal   Assets  Assets: Resilience  Sleep  Sleep: Sleep: Good   Physical Exam: Physical Exam Vitals and nursing note reviewed.   Constitutional:      Appearance: Normal appearance.  HENT:     Nose: Nose normal.  Eyes:     Pupils: Pupils are equal, round, and reactive to light.  Pulmonary:     Effort: Pulmonary effort is normal.  Genitourinary:  Comments: Deferred Musculoskeletal:        General: Normal range of motion.     Cervical back: Normal range of motion.  Neurological:     Mental Status: She is alert and oriented to person, place, and time.    Review of Systems  Constitutional:  Negative for fever.  HENT:  Negative for hearing loss.   Eyes:  Negative for blurred vision.  Respiratory:  Negative for cough.   Cardiovascular:  Negative for chest pain.  Gastrointestinal:  Negative for heartburn.  Genitourinary:  Negative for dysuria.  Musculoskeletal:  Negative for myalgias.  Skin:  Negative for rash.  Neurological:  Negative for dizziness.  Psychiatric/Behavioral:  Positive for depression. Negative for hallucinations, memory loss, substance abuse and suicidal ideas. The patient is nervous/anxious and has insomnia.    Blood pressure 103/69, pulse (!) 58, temperature 98.8 F (37.1 C), temperature source Oral, resp. rate 16, height 4\' 11"  (1.499 m), weight 63 kg, SpO2 97%. Body mass index is 28.05 kg/m.  Treatment Plan Summary: Daily contact with patient to assess and evaluate symptoms and progress in treatment and Medication management.    Still waiting on safe disposition as patient would not be able to live independently given cognitive impairment. Recommend assisted living.    No medication side effects.  Because of urinary incontinence, recommended adult diapers to be worn.   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  Plan:   -Continue Vitamin D 50.000 units weekly for bone health. -Continue Sertraline150 mg po Q daily for depression/anxiety -Continue Buspar 15 mg po bid for anxiety.  -Continue paliperidone 6 mg po qd for mood stabilization -Continue Trazodone 50 mg nightly for insomnia  -Continue Nicoderm 21 mg topically Q 24 hrs for nicotine withdrawal management. -Continue vitamin B12 1000 mcg IM weekly for 4 weeks then monthly -Continue Melatonin 5 mg nightly for sleep -Continue Protonix EC 40 mg p.o. daily for GERD -Continue MiraLAX 17 g p.o. PRN for constipation -Continue hydroxyzine 25 mg p.o. 3 times daily as needed for anxiety -Continue Ensure nutritional shakes TID in between meals  -Previously discontinued Hydroxyzine 50 mg nightly-hypotension in the mornings   Safety and Monitoring: Voluntary admission to inpatient psychiatric unit for safety, stabilization and treatment Daily contact with patient to assess and evaluate symptoms and progress in treatment Patient's case to be discussed in multi-disciplinary team meeting Observation Level : q15 minute checks Vital signs: q12 hours Precautions: Safety   Discharge Planning: Social work and case management to assist with discharge planning and identification of hospital follow-up needs prior to discharge Estimated LOS: Unknown at this time. Discharge Concerns: Need to establish a safety plan; Medication compliance and effectiveness Discharge Goals: Return home with outpatient referrals for mental health follow-up including medication management/psychotherapy No legal guardian, has payee  [name], NP, pmhnp 02/26/2023, 2:38 PM Patient ID: Yolanda [name], female   DOB: Sep 18, 1975, 47 y.o.   MRN: [id number]

## 2023-02-27 ENCOUNTER — Encounter (HOSPITAL_COMMUNITY): Payer: Self-pay

## 2023-02-27 DIAGNOSIS — F251 Schizoaffective disorder, depressive type: Secondary | ICD-10-CM | POA: Diagnosis not present

## 2023-02-27 NOTE — Plan of Care (Signed)
  Problem: Education: Goal: Utilization of techniques to improve thought processes will improve Outcome: Progressing Goal: Knowledge of the prescribed therapeutic regimen will improve Outcome: Progressing   Problem: Activity: Goal: Interest or engagement in leisure activities will improve Outcome: Progressing Goal: Imbalance in normal sleep/wake cycle will improve Outcome: Progressing   Problem: Coping: Goal: Coping ability will improve Outcome: Progressing Goal: Will verbalize feelings Outcome: Progressing   Problem: Health Behavior/Discharge Planning: Goal: Ability to make decisions will improve Outcome: Progressing Goal: Compliance with therapeutic regimen will improve Outcome: Progressing   Problem: Role Relationship: Goal: Will demonstrate positive changes in social behaviors and relationships Outcome: Progressing   Problem: Safety: Goal: Ability to disclose and discuss suicidal ideas will improve Outcome: Progressing Goal: Ability to identify and utilize support systems that promote safety will improve Outcome: Progressing   Problem: Self-Concept: Goal: Will verbalize positive feelings about self Outcome: Progressing   Problem: Coping: Goal: Coping ability will improve Outcome: Progressing

## 2023-02-27 NOTE — Progress Notes (Signed)
   02/27/23 0530  15 Minute Checks  Location Bedroom  Visual Appearance Calm  Behavior Sleeping  Sleep (Behavioral Health Patients Only)  Calculate sleep? (Click Yes once per 24 hr at 0600 safety check) Yes  Documented sleep last 24 hours 8.5

## 2023-02-27 NOTE — [facility] Group Notes (Signed)
Adult Psychoeducational Group Note  Date:  02/27/2023 Time:  9:34 PM  Group Topic/Focus:  Wrap-Up Group:   The focus of this group is to help patients review their daily goal of treatment and discuss progress on daily workbooks.  Participation Level:  Did Not Attend  Yolanda [name] 02/27/2023, 9:34 PM

## 2023-02-27 NOTE — Progress Notes (Signed)
   02/27/23 1000  Psych Admission Type (Psych Patients Only)  Admission Status Involuntary  Psychosocial Assessment  Patient Complaints Isolation;Sadness  Eye Contact Fair  Facial Expression Flat  Affect Apathetic;Sad  Speech Soft;Slow  Interaction Childlike  Motor Activity Slow  Appearance/Hygiene Unremarkable  Behavior Characteristics Unwilling to participate  Mood Preoccupied;Sad  Thought Process  Coherency WDL  Content WDL  Delusions None reported or observed  Perception WDL  Hallucination None reported or observed  Judgment Poor  Confusion None  Danger to Self  Current suicidal ideation? Denies  Agreement Not to Harm Self Yes  Description of Agreement verbal  Danger to Others  Danger to Others None reported or observed

## 2023-02-27 NOTE — [facility] Group Notes (Signed)
Spirituality group facilitated by Kathleen Argue, BCC.  Group Description: Group focused on topic of hope. Patients participated in facilitated discussion around topic, connecting with one another around experiences and definitions for hope. Group members engaged with visual explorer photos, reflecting on what hope looks like for them today. Group engaged in discussion around how their definitions of hope are present today in hospital.  Modalities: Psycho-social ed, Adlerian, Narrative, MI  Patient Progress: Yolanda [name] attended group and demonstrated engagement in the conversation.  She shared her hopes of finishing her education and being able to go home.

## 2023-02-27 NOTE — BH IP Treatment Plan (Signed)
Interdisciplinary Treatment and Diagnostic Plan Update  02/27/2023 Time of Session: 10:00am (UPDATE) Yolanda [name] MRN: [id number]  Principal Diagnosis: Schizoaffective disorder, depressive type (HCC)  Secondary Diagnoses: Principal Problem:   Schizoaffective disorder, depressive type (HCC) Active Problems:   GERD (gastroesophageal reflux disease)   Intellectual disability   Tobacco use disorder   Current Medications:  Current Facility-Administered Medications  Medication Dose Route Frequency Provider Last Rate Last Admin   acetaminophen (TYLENOL) tablet 650 mg  650 mg Oral Q6H PRN [name], NP   650 mg at 02/25/23 2100   alum & mag hydroxide-simeth (MAALOX/MYLANTA) 200-200-20 MG/5ML suspension 30 mL  30 mL Oral Q4H PRN [name], NP   30 mL at 01/28/23 1530   busPIRone (BUSPAR) tablet 15 mg  15 mg Oral BID [name], NP   15 mg at 02/27/23 0830   cyanocobalamin (VITAMIN B12) injection 1,000 mcg  1,000 mcg Intramuscular Q30 days [name], Nadir, MD   1,000 mcg at 02/02/23 1238   diphenhydrAMINE (BENADRYL) capsule 50 mg  50 mg Oral TID PRN [name], NP   50 mg at 01/15/23 1211   Or   diphenhydrAMINE (BENADRYL) injection 50 mg  50 mg Intramuscular TID PRN [name], NP       feeding supplement (ENSURE ENLIVE / ENSURE PLUS) liquid 237 mL  237 mL Oral TID BM Nkwenti, Doris, NP   237 mL at 02/27/23 1021   haloperidol (HALDOL) tablet 5 mg  5 mg Oral TID PRN [name], NP   5 mg at 01/15/23 1211   Or   haloperidol lactate (HALDOL) injection 5 mg  5 mg Intramuscular TID PRN [name], NP       hydrOXYzine (ATARAX) tablet 25 mg  25 mg Oral TID PRN [name], DO   25 mg at 02/20/23 2156   LORazepam (ATIVAN) tablet 2 mg  2 mg Oral TID PRN [name], NP   2 mg at 01/15/23 1211   Or   LORazepam (ATIVAN) injection 2 mg  2 mg Intramuscular TID PRN [name], NP       magnesium hydroxide (MILK OF MAGNESIA) suspension 30 mL  30 mL Oral Daily PRN  [name], NP   30 mL at 02/26/23 1828   melatonin tablet 5 mg  5 mg Oral QHS Nkwenti, Doris, NP   5 mg at 02/26/23 2103   nicotine (NICODERM CQ - dosed in mg/24 hours) patch 21 mg  21 mg Transdermal Daily [name], DO   21 mg at 02/27/23 0830   paliperidone (INVEGA) 24 hr tablet 6 mg  6 mg Oral Daily [name], NP   6 mg at 02/26/23 2103   pantoprazole (PROTONIX) EC tablet 40 mg  40 mg Oral Daily Nwoko, Nicole Kindred I, NP   40 mg at 02/27/23 0830   polyethylene glycol (MIRALAX / GLYCOLAX) packet 17 g  17 g Oral Daily PRN [name], NP   17 g at 02/18/23 1624   sertraline (ZOLOFT) tablet 150 mg  150 mg Oral Daily Massengill, Harrold Donath, MD   150 mg at 02/27/23 0830   traZODone (DESYREL) tablet 50 mg  50 mg Oral QHS [name], NP   50 mg at 02/26/23 2103   Vitamin D (Ergocalciferol) (DRISDOL) 1.25 MG (50000 UNIT) capsule 50,000 Units  50,000 Units Oral Q7 days [name], NP   50,000 Units at 02/23/23 2010   PTA Medications: Medications Prior to Admission  Medication Sig Dispense Refill Last  Dose   busPIRone (BUSPAR) 15 MG tablet Take 15 mg by mouth 2 (two) times daily. (Patient not taking: Reported on 12/19/2022)      paliperidone (INVEGA SUSTENNA) 156 MG/ML SUSY injection Inject 156 mg into the muscle once. (Patient not taking: Reported on 12/19/2022)      sertraline (ZOLOFT) 50 MG tablet Take 150 mg by mouth daily. (Patient not taking: Reported on 12/19/2022)      traZODone (DESYREL) 100 MG tablet Take 100 mg by mouth at bedtime as needed for sleep. (Patient not taking: Reported on 11/12/2022)       Patient Stressors: Medication change or noncompliance    Patient Strengths: Forensic psychologist fund of knowledge   Treatment Modalities: Medication Management, Group therapy, Case management,  1 to 1 session with clinician, Psychoeducation, Recreational therapy.   Physician Treatment Plan for Primary Diagnosis: Schizoaffective disorder, depressive type (HCC) Long Term  Goal(s): Improvement in symptoms so as ready for discharge   Short Term Goals: Ability to identify and develop effective coping behaviors will improve Ability to maintain clinical measurements within normal limits will improve Compliance with prescribed medications will improve Ability to identify triggers associated with substance abuse/mental health issues will improve Ability to identify changes in lifestyle to reduce recurrence of condition will improve Ability to verbalize feelings will improve Ability to disclose and discuss suicidal ideas Ability to demonstrate self-control will improve  Medication Management: Evaluate patient's response, side effects, and tolerance of medication regimen.  Therapeutic Interventions: 1 to 1 sessions, Unit Group sessions and Medication administration.  Evaluation of Outcomes: Progressing  Physician Treatment Plan for Secondary Diagnosis: Principal Problem:   Schizoaffective disorder, depressive type (HCC) Active Problems:   GERD (gastroesophageal reflux disease)   Intellectual disability   Tobacco use disorder  Long Term Goal(s): Improvement in symptoms so as ready for discharge   Short Term Goals: Ability to identify and develop effective coping behaviors will improve Ability to maintain clinical measurements within normal limits will improve Compliance with prescribed medications will improve Ability to identify triggers associated with substance abuse/mental health issues will improve Ability to identify changes in lifestyle to reduce recurrence of condition will improve Ability to verbalize feelings will improve Ability to disclose and discuss suicidal ideas Ability to demonstrate self-control will improve     Medication Management: Evaluate patient's response, side effects, and tolerance of medication regimen.  Therapeutic Interventions: 1 to 1 sessions, Unit Group sessions and Medication administration.  Evaluation of Outcomes:  Progressing   RN Treatment Plan for Primary Diagnosis: Schizoaffective disorder, depressive type (HCC) Long Term Goal(s): Knowledge of disease and therapeutic regimen to maintain health will improve  Short Term Goals: Ability to remain free from injury will improve, Ability to verbalize frustration and anger appropriately will improve, Ability to participate in decision making will improve, Ability to verbalize feelings will improve, Ability to identify and develop effective coping behaviors will improve, and Compliance with prescribed medications will improve  Medication Management: RN will administer medications as ordered by provider, will assess and evaluate patient's response and provide education to patient for prescribed medication. RN will report any adverse and/or side effects to prescribing provider.  Therapeutic Interventions: 1 on 1 counseling sessions, Psychoeducation, Medication administration, Evaluate responses to treatment, Monitor vital signs and CBGs as ordered, Perform/monitor CIWA, COWS, AIMS and Fall Risk screenings as ordered, Perform wound care treatments as ordered.  Evaluation of Outcomes: Progressing   LCSW Treatment Plan for Primary Diagnosis: Schizoaffective disorder, depressive type (HCC) Long Term  Goal(s): Safe transition to appropriate next level of care at discharge, Engage patient in therapeutic group addressing interpersonal concerns.  Short Term Goals: Engage patient in aftercare planning with referrals and resources, Increase social support, Increase emotional regulation, Facilitate acceptance of mental health diagnosis and concerns, Identify triggers associated with mental health/substance abuse issues, and Increase skills for wellness and recovery  Therapeutic Interventions: Assess for all discharge needs, 1 to 1 time with Social worker, Explore available resources and support systems, Assess for adequacy in community support network, Educate family and  significant other(s) on suicide prevention, Complete Psychosocial Assessment, Interpersonal group therapy.  Evaluation of Outcomes: Progressing   Progress in Treatment: Attending groups: Yes. Participating in groups: Yes. Taking medication as prescribed: Yes. Toleration medication: Yes. Family/Significant other contact made: Yes, individual(s) contacted:  CSW has been in contact with DSS  Patient understands diagnosis: No. Discussing patient identified problems/goals with staff: Yes. Medical problems stabilized or resolved: Yes. Denies suicidal/homicidal ideation: Yes. Issues/concerns per patient self-inventory: No.  New problem(s) identified: No, Describe:  none reported   New Short Term/Long Term Goal(s): medication stabilization, elimination of SI thoughts, development of comprehensive mental wellness plan.   Patient Goals:  coping skills  Discharge Plan or Barriers: Patient recently admitted. CSW will continue to follow and assess for appropriate referrals and possible discharge planning.    Reason for Continuation of Hospitalization: Medication stabilization  Estimated Length of Stay: unknown  Last 3 [place] Suicide Severity Risk Score: Flowsheet Row Ad[facility] (Current) [facility] from [facility]19/2022 in [facility] ED from 11/12/2022 in [facility] Emergency Department at [facility]  C-SSRS RISK CATEGORY High Risk High Risk Moderate Risk       Last [facility] 2/9 Scores:     No data to display          Scribe for Treatment Team: Izell Westchase, [place] 02/27/2023 12:41 PM

## 2023-02-27 NOTE — Group Note (Signed)
Recreation Therapy Group Note   Group Topic:Problem Solving  Group Date: 02/27/2023 Start Time: 0935 End Time: 1005 Facilitators: Grettell Ransdell-McCall, LRT,CTRS Location: [address] Dayroom   Goal Area(s) Addresses:  Patient will effectively work with peer towards shared goal.  Patient will identify skills used to make activity successful.  Patient will share challenges and verbalize solution-driven approaches used. Patient will identify how skills used during activity can be used to reach post d/c goals.   Group Description: [employer]. Patients were provided the following materials: 4 drinking straws, 5 rubber bands, 5 paper clips, 2 index cards, 2 drinking cups, and 2 toilet paper rolls. Using the provided materials patients were asked to build a launching mechanism to launch a ping pong ball across the room, approximately 10 feet. Patients were divided into teams of 3-5. Instructions required all materials be incorporated into the device, functionality of items left to the peer group's discretion.   Affect/Mood: N/A   Participation Level: Did not attend    Clinical Observations/Individualized Feedback:     Plan: Continue to engage patient in RT group sessions 2-3x/week.   Raima Geathers-McCall, LRT,CTRS  02/27/2023 12:23 PM

## 2023-02-27 NOTE — Progress Notes (Signed)
[facility] MD Progress Note  02/27/2023 12:27 PM Yolanda [name]  MRN:  [id number] Principal Problem: Schizoaffective disorder, depressive type (HCC) Diagnosis: Principal Problem:   Schizoaffective disorder, depressive type (HCC) Active Problems:   GERD (gastroesophageal reflux disease)   Intellectual disability   Tobacco use disorder  Reason for admission: This is the first psychiatric admission in this [facility] in 12 years for this 47 AA female with an extensive hx of mental illnesses & probable polysubstance use disorders. She is admitted to the [facility] from the [facility] hospital with complain of worsening suicidal ideations with plan to stab herself. Per chart review, patient apparently reported at the ED that she has been depressed for a while & has not been taking her mental health medications. After medical evaluation.clearance, she was transferred to the [facility] for further psychiatric evaluation/treatments.   24 hr chart review: Sleep Hours last night: 8.5 hrs last night as per nursing documentation.  Nursing Concerns: None Behavioral episodes in the past 24 hrs: None Medication Compliance: Compliant  Vital Signs in the past 24 hrs: WNL PRN Medications in the past 24 hrs: None  Patient assessment note: Pt presents today with a flat affect and depressed mood, attention to personal hygiene and grooming is fair, eye contact is good, speech is clear & coherent. Thought contents are organized and logical, and pt currently denies SI/HI/AVH or paranoia. There is no evidence of delusional thoughts.  She reports feeling unmotivated to do anything anymore, refusing to come out of her room earlier today morning even after several positive verbal reinforcements are given to her by Clinical research associate for her to do so.    Continues to deny having side effects to current psychiatric medications. We discussed keeping meds same with no changes. She denies being in any physical pain, denies medication related side effects,  denies having issues with bowel movements. No TD/EPS type symptoms found on assessment, and pt denies any feelings of stiffness. AIMS: 0.   Discussed the following psychosocial stressors: Continuous hospitalization. Patient is stable for discharge. We are continuing to await DSS placement in a safe and appropriate living arrangement outside of the hospital environment.  Nursing educated to continue to provide support and encouragement for pt to get out of her room & participate in unit activities while we await placement. Pt is going outside of the building for sunlight whenever the weather permits as per unit protocol. Continuing all medications as listed below with no changes. Denies issues with bowel movements, denies physical pain today.     Total Time spent with patient:  45 minutes  Past Psychiatric History: See H & P  Past Medical History:  Past Medical History:  Diagnosis Date   Bipolar affect, depressed (HCC)    Constipation 08/17/2022   Depression    Falls 07/21/2022   Fracture of femoral neck, right, closed (HCC) 01/17/2022   Herpes simplex 08/22/2017   Open fracture dislocation of right elbow joint 01/17/2022    Past Surgical History:  Procedure Laterality Date   NO PAST SURGERIES     SALPINGECTOMY     Family History: History reviewed. No pertinent family history.  Family Psychiatric  History: See H & P  Social History:  Social History   Substance and Sexual Activity  Alcohol Use Yes     Social History   Substance and Sexual Activity  Drug Use Yes   Types: Cocaine, Marijuana    Social History   Socioeconomic History   Marital status: Single  Spouse name: Not on file   Number of children: Not on file   Years of education: Not on file   Highest education level: Not on file  Occupational History   Not on file  Tobacco Use   Smoking status: Every Day   Smokeless tobacco: Not on file  Substance and Sexual Activity   Alcohol use: Yes   Drug use: Yes     Types: Cocaine, Marijuana   Sexual activity: Yes  Other Topics Concern   Not on file  Social History Narrative   Not on file   Social Determinants of Health   Financial Resource Strain: Low Risk  (09/12/2022)   Received from [facility]   Overall Financial Resource Strain (CARDIA)    Difficulty of Paying Living Expenses: Not hard at all  Food Insecurity: Patient Declined (12/20/2022)   Hunger Vital Sign    Worried About Running Out of Food in the Last Year: Patient declined    Ran Out of Food in the Last Year: Patient declined  Transportation Needs: No Transportation Needs (12/20/2022)   PRAPARE - Administrator, Civil Service (Medical): No    Lack of Transportation (Non-Medical): No  Physical Activity: Not on file  Stress: No Stress Concern Present (07/17/2022)   Received from [facility], [facility] of Occupational Health - Occupational Stress Questionnaire    Feeling of Stress : Not at all  Social Connections: Unknown (07/16/2022)   Received from [facility], [facility]   Social Network    Social Network: Not on file   Sleep: Good  Appetite:  Good  Current Medications: Current Facility-Administered Medications  Medication Dose Route Frequency Provider Last Rate Last Admin   acetaminophen (TYLENOL) tablet 650 mg  650 mg Oral Q6H PRN [name], NP   650 mg at 02/25/23 2100   alum & mag hydroxide-simeth (MAALOX/MYLANTA) 200-200-20 MG/5ML suspension 30 mL  30 mL Oral Q4H PRN [name], NP   30 mL at 01/28/23 1530   busPIRone (BUSPAR) tablet 15 mg  15 mg Oral BID [name], NP   15 mg at 02/27/23 0830   cyanocobalamin (VITAMIN B12) injection 1,000 mcg  1,000 mcg Intramuscular Q30 days [name], Nadir, MD   1,000 mcg at 02/02/23 1238   diphenhydrAMINE (BENADRYL) capsule 50 mg  50 mg Oral TID PRN [name], NP   50 mg at 01/15/23 1211   Or   diphenhydrAMINE (BENADRYL) injection 50 mg  50 mg Intramuscular TID PRN  [name], NP       feeding supplement (ENSURE ENLIVE / ENSURE PLUS) liquid 237 mL  237 mL Oral TID BM Temiloluwa Laredo, NP   237 mL at 02/27/23 1021   haloperidol (HALDOL) tablet 5 mg  5 mg Oral TID PRN [name], NP   5 mg at 01/15/23 1211   Or   haloperidol lactate (HALDOL) injection 5 mg  5 mg Intramuscular TID PRN [name], NP       hydrOXYzine (ATARAX) tablet 25 mg  25 mg Oral TID PRN [name], DO   25 mg at 02/20/23 2156   LORazepam (ATIVAN) tablet 2 mg  2 mg Oral TID PRN [name], NP   2 mg at 01/15/23 1211   Or   LORazepam (ATIVAN) injection 2 mg  2 mg Intramuscular TID PRN [name], NP       magnesium hydroxide (MILK OF MAGNESIA) suspension 30 mL  30 mL Oral Daily PRN [name], NP   30 mL at 02/26/23 1828   melatonin tablet 5 mg  5 mg Oral QHS Starnisha Batrez, NP   5 mg at 02/26/23 2103   nicotine (NICODERM CQ - dosed in mg/24 hours) patch 21 mg  21 mg Transdermal Daily [name], DO   21 mg at 02/27/23 0830   paliperidone (INVEGA) 24 hr tablet 6 mg  6 mg Oral Daily [name], NP   6 mg at 02/26/23 2103   pantoprazole (PROTONIX) EC tablet 40 mg  40 mg Oral Daily [name], NP   40 mg at 02/27/23 0830   polyethylene glycol (MIRALAX / GLYCOLAX) packet 17 g  17 g Oral Daily PRN [name], NP   17 g at 02/18/23 1624   sertraline (ZOLOFT) tablet 150 mg  150 mg Oral Daily Massengill, Harrold Donath, MD   150 mg at 02/27/23 0830   traZODone (DESYREL) tablet 50 mg  50 mg Oral QHS [name], NP   50 mg at 02/26/23 2103   Vitamin D (Ergocalciferol) (DRISDOL) 1.25 MG (50000 UNIT) capsule 50,000 Units  50,000 Units Oral Q7 days [name], NP   50,000 Units at 02/23/23 2010   Lab Results: No results found for this or any previous visit (from the past 48 hour(s)).  Blood Alcohol level:  Lab Results  Component Value Date   ETH <10 12/19/2022   ETH <10 11/21/2019   Metabolic Disorder Labs: Lab Results  Component Value Date   HGBA1C 4.4 (L)  12/21/2022   MPG 79.58 12/21/2022   No results found for: "PROLACTIN" Lab Results  Component Value Date   CHOL 218 (H) 12/21/2022   TRIG 81 12/21/2022   HDL 53 12/21/2022   CHOLHDL 4.1 12/21/2022   VLDL 16 12/21/2022   LDLCALC 149 (H) 12/21/2022   LDLCALC 110 (H) 03/13/2011   Physical Findings: AIMS: Facial and Oral Movements Muscles of Facial Expression: None, normal Lips and Perioral Area: None, normal Jaw: None, normal Tongue: None, normal,Extremity Movements Upper (arms, wrists, hands, fingers): None, normal Lower (legs, knees, ankles, toes): None, normal, Trunk Movements Neck, shoulders, hips: None, normal, Overall Severity Severity of abnormal movements (highest score from questions above): None, normal Incapacitation due to abnormal movements: None, normal Patient's awareness of abnormal movements (rate only patient's report): No Awareness, Dental Status Current problems with teeth and/or dentures?: No Does patient usually wear dentures?: No  CIWA:    COWS:    AIMS:0 Musculoskeletal: Strength & Muscle Tone: within normal limits Gait & Station: normal Patient leans: N/A  Psychiatric Specialty Exam:  Presentation  General Appearance:  Appropriate for Environment; Fairly Groomed  Eye Contact: Fair  Speech: Garbled  Speech Volume: Normal  Handedness: Right  Mood and Affect  Mood: Depressed; Anxious  Affect: Congruent  Thought Process  Thought Processes: Coherent  Descriptions of Associations:Intact  Orientation:Partial  Thought Content:Logical  History of Schizophrenia/Schizoaffective disorder:Yes  Duration of Psychotic Symptoms:Greater than six months  Hallucinations:Hallucinations: None   Ideas of Reference:None  Suicidal Thoughts:Suicidal Thoughts: No   Homicidal Thoughts:Homicidal Thoughts: No   Sensorium  Memory: Immediate Good  Judgment: Poor  Insight: Poor  Executive Functions  Concentration: Poor  Attention  Span: Poor  Recall: Poor  Fund of Knowledge: Poor  Language: Fair  Psychomotor Activity  Psychomotor Activity: Psychomotor Activity: Normal   Assets  Assets: Resilience  Sleep  Sleep: Sleep: Good   Physical Exam: Physical Exam Vitals and nursing note reviewed.  Constitutional:      Appearance: Normal appearance.  HENT:     Nose: Nose normal.  Eyes:     Pupils: Pupils are equal, round, and reactive to light.  Pulmonary:     Effort: Pulmonary effort is normal.  Genitourinary:    Comments: Deferred Musculoskeletal:        General: Normal range of motion.     Cervical back: Normal range of motion.  Neurological:     Mental Status: She is alert and oriented to person, place, and time.    Review of Systems  Constitutional:  Negative for fever.  HENT:  Negative for hearing loss.   Eyes:  Negative for blurred vision.  Respiratory:  Negative for cough.   Cardiovascular:  Negative for chest pain.  Gastrointestinal:  Negative for heartburn.  Genitourinary:  Negative for dysuria.  Musculoskeletal:  Negative for myalgias.  Skin:  Negative for rash.  Neurological:  Negative for dizziness.  Psychiatric/Behavioral:  Positive for depression. Negative for hallucinations, memory loss, substance abuse and suicidal ideas. The patient is nervous/anxious and has insomnia.    Blood pressure 96/70, pulse 64, temperature 98.8 F (37.1 C), temperature source Oral, resp. rate 16, height 4\' 11"  (1.499 m), weight 63 kg, SpO2 97%. Body mass index is 28.05 kg/m.  Treatment Plan Summary: Daily contact with patient to assess and evaluate symptoms and progress in treatment and Medication management.    Still waiting on safe disposition as patient would not be able to live independently given cognitive impairment. Recommend assisted living.    No medication side effects.  Because of urinary incontinence, recommended adult diapers to be worn.   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  Plan:  -Continue Vitamin D 50.000 units weekly for bone health. -Continue Sertraline150 mg po Q daily for depression/anxiety -Continue Buspar 15 mg po bid for anxiety.  -Continue paliperidone 6 mg po qd for mood stabilization -Continue Trazodone 50 mg nightly for insomnia  -Continue Nicoderm 21 mg topically Q 24 hrs for nicotine withdrawal management. -Continue vitamin B12 1000 mcg IM weekly for 4 weeks then monthly -Continue Melatonin 5 mg nightly for sleep -Continue Protonix EC 40 mg p.o. daily for GERD -Continue MiraLAX 17 g p.o. PRN for constipation -Continue hydroxyzine 25 mg p.o. 3 times daily as needed for anxiety -Continue Ensure nutritional shakes TID in between meals  -Previously discontinued Hydroxyzine 50 mg nightly-hypotension in the mornings   Safety and Monitoring: Voluntary admission to inpatient psychiatric unit for safety, stabilization and treatment Daily contact with patient to assess and evaluate symptoms and progress in treatment Patient's case to be discussed in multi-disciplinary team meeting Observation Level : q15 minute checks Vital signs: q12 hours Precautions: Safety   Discharge Planning: Social work and case management to assist with discharge planning and identification of hospital follow-up needs prior to discharge Estimated LOS: Unknown at this time. Discharge Concerns: Need to establish a safety plan; Medication compliance and effectiveness Discharge Goals: Return home with outpatient referrals for mental health follow-up including medication management/psychotherapy No legal guardian, has payee  [name], NP, pmhnp 02/27/2023, 12:27 PM Patient ID: Yolanda [name],  female   DOB: 06/28/1976, 47 y.o.   MRN: [id number]

## 2023-02-27 NOTE — Group Note (Signed)
Date:  02/27/2023 Time:  11:53 AM  Group Topic/Focus:  Goals Group:   The focus of this group is to help patients establish daily goals to achieve during treatment and discuss how the patient can incorporate goal setting into their daily lives to aide in recovery.    Participation Level:  Did Not Attend  Participation Quality:      Affect:      Cognitive:      Insight: None  Engagement in Group:      Modes of Intervention:      Additional Comments:     [name] 02/27/2023, 11:53 AM

## 2023-02-27 NOTE — BHH Counselor (Signed)
  02/27/2023  12:14 PM   Yolanda [name]  CSW called and spoke with DSS worker Thamas Jaegers, DSS worker sent out an email with CSW on copy to [facility], who is the case Production designer, theatre/television/film with [employer]. Waiting for Marianna Fuss Stokes to respond, CSW will continue to follow.   Signed:  [name] MSW, [facility] 02/27/2023  12:14 PM

## 2023-02-28 DIAGNOSIS — F251 Schizoaffective disorder, depressive type: Secondary | ICD-10-CM | POA: Diagnosis not present

## 2023-02-28 NOTE — Group Note (Signed)
LCSW Group Therapy Note   Group Date: 02/28/2023 Start Time: 1100 End Time: 1200   Type of Therapy and Topic:  Group Therapy - Who Am I?  Participation Level:  DID NOT ATTEND  Description of Group The focus of this group was to aid patients in self-exploration and awareness. Patients were guided in exploring various factors of oneself to include interests, readiness to change, management of emotions, and individual perception of self. Patients were provided with complementary worksheets exploring hidden talents, ease of asking other for help, music/media preferences, understanding and responding to feelings/emotions, and hope for the future. At group closing, patients were encouraged to adhere to discharge plan to assist in continued self-exploration and understanding.    Therapeutic Modalities Cognitive Behavioral Therapy Motivational Interviewing Kathrynn Humble 02/28/2023  12:33 PM

## 2023-02-28 NOTE — Plan of Care (Signed)
  Problem: Education: Goal: Utilization of techniques to improve thought processes will improve Outcome: Progressing Goal: Knowledge of the prescribed therapeutic regimen will improve Outcome: Progressing   Problem: Activity: Goal: Interest or engagement in leisure activities will improve Outcome: Progressing Goal: Imbalance in normal sleep/wake cycle will improve Outcome: Progressing   Problem: Coping: Goal: Coping ability will improve Outcome: Progressing Goal: Will verbalize feelings Outcome: Progressing   Problem: Health Behavior/Discharge Planning: Goal: Ability to make decisions will improve Outcome: Progressing Goal: Compliance with therapeutic regimen will improve Outcome: Progressing   Problem: Role Relationship: Goal: Will demonstrate positive changes in social behaviors and relationships Outcome: Progressing   Problem: Safety: Goal: Ability to disclose and discuss suicidal ideas will improve Outcome: Progressing

## 2023-02-28 NOTE — B[facility] Group Notes (Signed)
B[facility] Group Notes:  (Nursing/MHT/Case Management/Adjunct)  Date:  02/28/2023  Time:  2000 Type of Therapy:   Wrap up group  Participation Level:  Active  Participation Quality:  Appropriate, Attentive, Sharing, and Supportive  Affect:  Anxious  Cognitive:  Alert  Insight:  Lacking  Engagement in Group:  Engaged  Modes of Intervention:  Clarification, Education, and Support  Summary of Progress/Problems: Positive thinking and positive change were discussed.   [name] 02/28/2023, 8:54 PM

## 2023-02-28 NOTE — Progress Notes (Signed)
   02/28/23 0900  Psych Admission Type (Psych Patients Only)  Admission Status Involuntary  Psychosocial Assessment  Patient Complaints Isolation;Sadness  Eye Contact Fair  Facial Expression Flat  Affect Sad;Apathetic  Speech Slow;Soft  Interaction Childlike  Motor Activity Slow  Appearance/Hygiene Poor hygiene  Behavior Characteristics Unwilling to participate  Mood Preoccupied;Sad  Thought Process  Coherency WDL  Content WDL  Delusions None reported or observed  Perception WDL  Hallucination None reported or observed  Judgment Poor  Confusion None  Danger to Self  Current suicidal ideation? Denies  Danger to Others  Danger to Others None reported or observed

## 2023-02-28 NOTE — Progress Notes (Signed)

## 2023-03-01 DIAGNOSIS — F251 Schizoaffective disorder, depressive type: Secondary | ICD-10-CM | POA: Diagnosis not present

## 2023-03-01 NOTE — Plan of Care (Signed)
  Problem: Activity: Goal: Interest or engagement in leisure activities will improve Outcome: Progressing   Problem: Coping: Goal: Coping ability will improve Outcome: Progressing   Problem: Health Behavior/Discharge Planning: Goal: Compliance with therapeutic regimen will improve Outcome: Progressing   Problem: Activity: Goal: Interest or engagement in leisure activities will improve Outcome: Progressing   Problem: Safety: Goal: Periods of time without injury will increase Outcome: Progressing

## 2023-03-01 NOTE — Progress Notes (Signed)
[facility] MD Progress Note  03/01/2023 2:29 PM Malorie MAKALEIGH HAUSCH  MRN:  [id number] Principal Problem: Schizoaffective disorder, depressive type (HCC) Diagnosis: Principal Problem:   Schizoaffective disorder, depressive type (HCC) Active Problems:   GERD (gastroesophageal reflux disease)   Intellectual disability   Tobacco use disorder  Reason for admission: This is the first psychiatric admission in this [facility] in 12 years for this 25 AA female with an extensive hx of mental illnesses & probable polysubstance use disorders. She is admitted to the [facility] from the [facility] hospital with complain of worsening suicidal ideations with plan to stab herself. Per chart review, patient apparently reported at the ED that she has been depressed for a while & has not been taking her mental health medications. After medical evaluation.clearance, she was transferred to the [facility] for further psychiatric evaluation/treatments.   24 hr chart review: Sleep Hours last night: not documented, but pt reports that sleep was fair Nursing Concerns: None Behavioral episodes in the past 24 hrs: None Medication Compliance: Compliant  Vital Signs in the past 24 hrs: WNL PRN Medications in the past 24 hrs: Miralax   Patient assessment note: Assessment yesterday remains unchanged: Pt with flat affect and depressed mood, attention to personal hygiene and grooming is fair, eye contact is good, speech is clear & coherent. Thought contents are organized and logical, and pt currently denies SI/HI/AVH or paranoia. There is no evidence of delusional thoughts.    Pt reports that sleep was fair, reports a fair appetite, states she did not eat breakfast due to not feeling hungry, denies being in physical pain, denies medication related side effects, states that she is moving her bowels well and denies any other concerns. We are continuing to make efforts to coordinate with DSS in an effort to secure an outpatient living arrangement that is safe  for patient due to her cognitive impairments prior to discharge. Continuing all medications as below. Staff has been educated to continue to provide patient with the positive verbal reinforcements that she needs to be able to get out of her room on a daily basis and participate in activities in the day room.     Total Time spent with patient:  45 minutes  Past Psychiatric History: See H & P  Past Medical History:  Past Medical History:  Diagnosis Date   Bipolar affect, depressed (HCC)    Constipation 08/17/2022   Depression    Falls 07/21/2022   Fracture of femoral neck, right, closed (HCC) 01/17/2022   Herpes simplex 08/22/2017   Open fracture dislocation of right elbow joint 01/17/2022    Past Surgical History:  Procedure Laterality Date   NO PAST SURGERIES     SALPINGECTOMY     Family History: History reviewed. No pertinent family history.  Family Psychiatric  History: See H & P  Social History:  Social History   Substance and Sexual Activity  Alcohol Use Yes     Social History   Substance and Sexual Activity  Drug Use Yes   Types: Cocaine, Marijuana    Social History   Socioeconomic History   Marital status: Single    Spouse name: Not on file   Number of children: Not on file   Years of education: Not on file   Highest education level: Not on file  Occupational History   Not on file  Tobacco Use   Smoking status: Every Day   Smokeless tobacco: Not on file  Substance and Sexual Activity   Alcohol  use: Yes   Drug use: Yes    Types: Cocaine, Marijuana   Sexual activity: Yes  Other Topics Concern   Not on file  Social History Narrative   Not on file   Social Determinants of Health   Financial Resource Strain: Low Risk  (09/12/2022)   Received from [facility], [facility]   Overall Financial Resource Strain (CARDIA)    Difficulty of Paying Living Expenses: Not hard at all  Food Insecurity: Patient Declined (12/20/2022)   Hunger Vital Sign     Worried About Running Out of Food in the Last Year: Patient declined    Ran Out of Food in the Last Year: Patient declined  Transportation Needs: No Transportation Needs (12/20/2022)   PRAPARE - Administrator, Civil Service (Medical): No    Lack of Transportation (Non-Medical): No  Physical Activity: Not on file  Stress: No Stress Concern Present (07/17/2022)   Received from [facility], [facility] Occupational [facility] - Occupational Stress Questionnaire    Feeling of Stress : Not at all  Social Connections: Unknown (07/16/2022)   Received from [facility], [facility]   Social Network    Social Network: Not on file   Sleep: Good  Appetite:  Good  Current Medications: Current Facility-Administered Medications  Medication Dose Route Frequency Provider Last Rate Last Admin   acetaminophen (TYLENOL) tablet 650 mg  650 mg Oral Q6H PRN [name], NP   650 mg at 02/25/23 2100   alum & mag hydroxide-simeth (MAALOX/MYLANTA) 200-200-20 MG/5ML suspension 30 mL  30 mL Oral Q4H PRN [name], NP   30 mL at 01/28/23 1530   busPIRone (BUSPAR) tablet 15 mg  15 mg Oral BID [name], NP   15 mg at 03/01/23 0745   cyanocobalamin (VITAMIN B12) injection 1,000 mcg  1,000 mcg Intramuscular Q30 days [name], Nadir, MD   1,000 mcg at 02/02/23 1238   diphenhydrAMINE (BENADRYL) capsule 50 mg  50 mg Oral TID PRN [name], NP   50 mg at 01/15/23 1211   Or   diphenhydrAMINE (BENADRYL) injection 50 mg  50 mg Intramuscular TID PRN [name], NP       feeding supplement (ENSURE ENLIVE / ENSURE PLUS) liquid 237 mL  237 mL Oral TID BM Merriam Brandner, NP   237 mL at 03/01/23 1124   haloperidol (HALDOL) tablet 5 mg  5 mg Oral TID PRN [name], NP   5 mg at 01/15/23 1211   Or   haloperidol lactate (HALDOL) injection 5 mg  5 mg Intramuscular TID PRN [name], NP       hydrOXYzine (ATARAX) tablet 25 mg  25 mg Oral TID PRN [name], DO   25 mg  at 02/20/23 2156   LORazepam (ATIVAN) tablet 2 mg  2 mg Oral TID PRN [name], NP   2 mg at 01/15/23 1211   Or   LORazepam (ATIVAN) injection 2 mg  2 mg Intramuscular TID PRN [name], NP       magnesium hydroxide (MILK OF MAGNESIA) suspension 30 mL  30 mL Oral Daily PRN [name], NP   30 mL at 02/26/23 1828   melatonin tablet 5 mg  5 mg Oral QHS Dacota Ruben, NP   5 mg at 02/28/23 2209   nicotine (NICODERM CQ - dosed in mg/24 hours) patch 21 mg  21 mg Transdermal Daily [name], DO   21 mg  at 02/27/23 0830   paliperidone (INVEGA) 24 hr tablet 6 mg  6 mg Oral Daily [name], NP   6 mg at 02/28/23 2209   pantoprazole (PROTONIX) EC tablet 40 mg  40 mg Oral Daily Nwoko, Nicole Kindred I, NP   40 mg at 03/01/23 0745   polyethylene glycol (MIRALAX / GLYCOLAX) packet 17 g  17 g Oral Daily PRN [name], NP   17 g at 03/01/23 1352   sertraline (ZOLOFT) tablet 150 mg  150 mg Oral Daily Massengill, Harrold Donath, MD   150 mg at 03/01/23 0745   traZODone (DESYREL) tablet 50 mg  50 mg Oral QHS [name], NP   50 mg at 02/28/23 2209   Vitamin D (Ergocalciferol) (DRISDOL) 1.25 MG (50000 UNIT) capsule 50,000 Units  50,000 Units Oral Q7 days [name], NP   50,000 Units at 02/23/23 2010   Lab Results: No results found for this or any previous visit (from the past 48 hour(s)).  Blood Alcohol level:  Lab Results  Component Value Date   ETH <10 12/19/2022   ETH <10 11/21/2019   Metabolic Disorder Labs: Lab Results  Component Value Date   HGBA1C 4.4 (L) 12/21/2022   MPG 79.58 12/21/2022   No results found for: "PROLACTIN" Lab Results  Component Value Date   CHOL 218 (H) 12/21/2022   TRIG 81 12/21/2022   HDL 53 12/21/2022   CHOLHDL 4.1 12/21/2022   VLDL 16 12/21/2022   LDLCALC 149 (H) 12/21/2022   LDLCALC 110 (H) 03/13/2011   Physical Findings: AIMS: Facial and Oral Movements Muscles of Facial Expression: None, normal Lips and Perioral Area: None, normal Jaw: None,  normal Tongue: None, normal,Extremity Movements Upper (arms, wrists, hands, fingers): None, normal Lower (legs, knees, ankles, toes): None, normal, Trunk Movements Neck, shoulders, hips: None, normal, Overall Severity Severity of abnormal movements (highest score from questions above): None, normal Incapacitation due to abnormal movements: None, normal Patient's awareness of abnormal movements (rate only patient's report): No Awareness, Dental Status Current problems with teeth and/or dentures?: No Does patient usually wear dentures?: No  CIWA:    COWS:    AIMS:0 Musculoskeletal: Strength & Muscle Tone: within normal limits Gait & Station: normal Patient leans: N/A  Psychiatric Specialty Exam:  Presentation  General Appearance:  Appropriate for Environment; Fairly Groomed  Eye Contact: Fair  Speech: Garbled  Speech Volume: Decreased  Handedness: Right  Mood and Affect  Mood: Depressed  Affect: Congruent  Thought Process  Thought Processes: Coherent  Descriptions of Associations:Intact  Orientation:Partial  Thought Content:Logical  History of Schizophrenia/Schizoaffective disorder:Yes  Duration of Psychotic Symptoms:Greater than six months  Hallucinations:Hallucinations: None    Ideas of Reference:None  Suicidal Thoughts:Suicidal Thoughts: No    Homicidal Thoughts:Homicidal Thoughts: No    Sensorium  Memory: Immediate Good  Judgment: Poor  Insight: Poor  Executive Functions  Concentration: Poor  Attention Span: Poor  Recall: Poor  Fund of Knowledge: Poor  Language: Poor  Psychomotor Activity  Psychomotor Activity: Psychomotor Activity: Normal    Assets  Assets: Resilience  Sleep  Sleep: Sleep: Fair    Physical Exam: Physical Exam Vitals and nursing note reviewed.  Constitutional:      Appearance: Normal appearance.  HENT:     Nose: Nose normal.  Eyes:     Pupils: Pupils are equal, round, and  reactive to light.  Pulmonary:     Effort: Pulmonary effort is normal.  Genitourinary:    Comments: Deferred Musculoskeletal:  General: Normal range of motion.     Cervical back: Normal range of motion.  Neurological:     Mental Status: She is alert and oriented to person, place, and time.    Review of Systems  Constitutional:  Negative for fever.  HENT:  Negative for hearing loss.   Eyes:  Negative for blurred vision.  Respiratory:  Negative for cough.   Cardiovascular:  Negative for chest pain.  Gastrointestinal:  Negative for heartburn.  Genitourinary:  Negative for dysuria.  Musculoskeletal:  Negative for myalgias.  Skin:  Negative for rash.  Neurological:  Negative for dizziness.  Psychiatric/Behavioral:  Positive for depression. Negative for hallucinations, memory loss, substance abuse and suicidal ideas. The patient is nervous/anxious and has insomnia.    Blood pressure 90/79, pulse 90, temperature 97.9 F (36.6 C), temperature source Oral, resp. rate 17, height 4\' 11"  (1.499 m), weight 63 kg, SpO2 100%. Body mass index is 28.05 kg/m.  Treatment Plan Summary: Daily contact with patient to assess and evaluate symptoms and progress in treatment and Medication management.    Still waiting on safe disposition as patient would not be able to live independently given cognitive impairment. Recommend assisted living.    No medication side effects.  Because of urinary incontinence, recommended adult diapers to be worn.   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  Plan:  -Continue Vitamin D 50.000 units weekly for bone health. -Continue Sertraline150 mg po Q daily for depression/anxiety -Continue Buspar 15 mg  po bid for anxiety.  -Continue paliperidone 6 mg po qd for mood stabilization -Continue Trazodone 50 mg nightly for insomnia  -Continue Nicoderm 21 mg topically Q 24 hrs for nicotine withdrawal management. -Continue vitamin B12 1000 mcg IM weekly for 4 weeks then monthly -Continue Melatonin 5 mg nightly for sleep -Continue Protonix EC 40 mg p.o. daily for GERD -Continue MiraLAX 17 g p.o. PRN for constipation -Continue hydroxyzine 25 mg p.o. 3 times daily as needed for anxiety -Continue Ensure nutritional shakes TID in between meals  -Previously discontinued Hydroxyzine 50 mg nightly-hypotension in the mornings   Safety and Monitoring: Voluntary admission to inpatient psychiatric unit for safety, stabilization and treatment Daily contact with patient to assess and evaluate symptoms and progress in treatment Patient's case to be discussed in multi-disciplinary team meeting Observation Level : q15 minute checks Vital signs: q12 hours Precautions: Safety   Discharge Planning: Social work and case management to assist with discharge planning and identification of hospital follow-up needs prior to discharge Estimated LOS: Unknown at this time. Discharge Concerns: Need to establish a safety plan; Medication compliance and effectiveness Discharge Goals: Return home with outpatient referrals for mental health follow-up including medication management/psychotherapy No legal guardian, has payee  [name], NP, pmhnp 03/01/2023, 2:29 PM Patient ID: Larey Brick, female   DOB: [birth year], 47 y.o.   MRN: [id number]

## 2023-03-01 NOTE — Plan of Care (Signed)
  Problem: Coping: Goal: Coping ability will improve Outcome: Progressing Goal: Will verbalize feelings Outcome: Progressing   

## 2023-03-01 NOTE — Progress Notes (Signed)
   03/01/23 0836  Psych Admission Type (Psych Patients Only)  Admission Status Involuntary  Psychosocial Assessment  Patient Complaints None  Eye Contact Brief  Facial Expression Flat  Affect Flat  Speech Soft  Interaction Childlike  Motor Activity Slow  Appearance/Hygiene Poor hygiene  Behavior Characteristics Cooperative  Mood Preoccupied  Thought Process  Coherency WDL  Content WDL  Delusions None reported or observed  Perception WDL  Hallucination None reported or observed  Judgment Poor  Confusion None  Danger to Self  Current suicidal ideation? Denies  Self-Injurious Behavior No self-injurious ideation or behavior indicators observed or expressed   Agreement Not to Harm Self Yes  Description of Agreement verbally contracts for safety  Danger to Others  Danger to Others None reported or observed

## 2023-03-01 NOTE — Progress Notes (Signed)
   03/01/23 0555  15 Minute Checks  Location Bedroom  Visual Appearance Calm  Behavior Composed  Sleep (Behavioral Health Patients Only)  Calculate sleep? (Click Yes once per 24 hr at 0600 safety check) Yes  Documented sleep last 24 hours 8     03/01/23 0555  15 Minute Checks  Location Bedroom  Visual Appearance Calm  Behavior Composed  Sleep (Behavioral Health Patients Only)  Calculate sleep? (Click Yes once per 24 hr at 0600 safety check) Yes  Documented sleep last 24 hours 8

## 2023-03-01 NOTE — Progress Notes (Signed)
   02/28/23 2200  Psych Admission Type (Psych Patients Only)  Admission Status Involuntary  Psychosocial Assessment  Patient Complaints Sadness  Eye Contact Brief  Facial Expression Sad  Affect Sad  Speech Soft;Slow  Interaction Childlike;Isolative  Motor Activity Slow  Appearance/Hygiene Poor hygiene  Behavior Characteristics Cooperative  Mood Preoccupied;Sad  Thought Process  Coherency WDL  Content WDL  Delusions None reported or observed  Perception WDL  Hallucination None reported or observed  Judgment Poor  Confusion None  Danger to Self  Current suicidal ideation? Denies  Agreement Not to Harm Self Yes  Description of Agreement verbal  Danger to Others  Danger to Others None reported or observed   Pt was offered support and encouragement. Pt was given scheduled medications. Q 15 minute checks were done for safety.  attended group. Minimally interactive with peers and staff. Pt has no complaints.Pt receptive to treatment and safety maintained on unit.

## 2023-03-01 NOTE — Progress Notes (Signed)
[facility] MD Progress Note  03/01/2023 8:37 AM Yolanda [name]  MRN:  [id number] Principal Problem: Schizoaffective disorder, depressive type (HCC) Diagnosis: Principal Problem:   Schizoaffective disorder, depressive type (HCC) Active Problems:   GERD (gastroesophageal reflux disease)   Intellectual disability   Tobacco use disorder  Reason for admission: This is the first psychiatric admission in this [facility] in 12 years for this 47 AA female with an extensive hx of mental illnesses & probable polysubstance use disorders. She is admitted to the [facility] from the [facility] hospital with complain of worsening suicidal ideations with plan to stab herself. Per chart review, patient apparently reported at the ED that she has been depressed for a while & has not been taking her mental health medications. After medical evaluation.clearance, she was transferred to the [facility] for further psychiatric evaluation/treatments.   24 hr chart review: Sleep Hours last night: 8.0 hrs last night as per nursing documentation.  Nursing Concerns: None Behavioral episodes in the past 24 hrs: None Medication Compliance: Compliant  Vital Signs in the past 24 hrs: WNL PRN Medications in the past 24 hrs: None  Patient assessment note: [name] remains without any changes in her mental state, mannerisms or acclivities. We are continuing to provide mental health care to maintain mood stability. The DSS are still in search of a residence for patient. She continues to tolerate her treatment regimen, Denies any side effects. Discussed the following psychosocial stressors: Continuous hospitalization. Patient is going outside of the building for sunlight whenever the weather permits as per unit protocol. Continuing all medications as listed below with no changes. Denies issues with bowel movements, denies physical pain today. There are no changes made on her current plan of care. Will continue as already in progress.     Total Time spent  with patient:  45 minutes  Past Psychiatric History: See H & P  Past Medical History:  Past Medical History:  Diagnosis Date   Bipolar affect, depressed (HCC)    Constipation 08/17/2022   Depression    Falls 07/21/2022   Fracture of femoral neck, right, closed (HCC) 01/17/2022   Herpes simplex 08/22/2017   Open fracture dislocation of right elbow joint 01/17/2022    Past Surgical History:  Procedure Laterality Date   NO PAST SURGERIES     SALPINGECTOMY     Family History: History reviewed. No pertinent family history.  Family Psychiatric  History: See H & P  Social History:  Social History   Substance and Sexual Activity  Alcohol Use Yes     Social History   Substance and Sexual Activity  Drug Use Yes   Types: Cocaine, Marijuana    Social History   Socioeconomic History   Marital status: Single    Spouse name: Not on file   Number of children: Not on file   Years of education: Not on file   Highest education level: Not on file  Occupational History   Not on file  Tobacco Use   Smoking status: Every Day   Smokeless tobacco: Not on file  Substance and Sexual Activity   Alcohol use: Yes   Drug use: Yes    Types: Cocaine, Marijuana   Sexual activity: Yes  Other Topics Concern   Not on file  Social History Narrative   Not on file   Social Determinants of Health   Financial Resource Strain: Low Risk  (09/12/2022)   Received from [facility], [facility], [facility], [facility]   Overall Financial Resource Strain (CARDIA)  Difficulty of Paying Living Expenses: Not hard at all  Food Insecurity: Patient Declined (12/20/2022)   Hunger Vital Sign    Worried About Running Out of Food in the Last Year: Patient declined    Ran Out of Food in the Last Year: Patient declined  Transportation Needs: No Transportation Needs (12/20/2022)   PRAPARE - Administrator, Civil Service (Medical): No    Lack of Transportation (Non-Medical): No  Physical Activity: Not on file   Stress: No Stress Concern Present (07/17/2022)   Received from [facility], [facility] of Occupational Health - Occupational Stress Questionnaire    Feeling of Stress : Not at all  Social Connections: Unknown (07/16/2022)   Received from [facility], [facility]   Social Network    Social Network: Not on file   Sleep: Good  Appetite:  Good  Current Medications: Current Facility-Administered Medications  Medication Dose Route Frequency Provider Last Rate Last Admin   acetaminophen (TYLENOL) tablet 650 mg  650 mg Oral Q6H PRN [name], NP   650 mg at 02/25/23 2100   alum & mag hydroxide-simeth (MAALOX/MYLANTA) 200-200-20 MG/5ML suspension 30 mL  30 mL Oral Q4H PRN [name], NP   30 mL at 01/28/23 1530   busPIRone (BUSPAR) tablet 15 mg  15 mg Oral BID [name], NP   15 mg at 03/01/23 0745   cyanocobalamin (VITAMIN B12) injection 1,000 mcg  1,000 mcg Intramuscular Q30 days [name], Nadir, MD   1,000 mcg at 02/02/23 1238   diphenhydrAMINE (BENADRYL) capsule 50 mg  50 mg Oral TID PRN [name], NP   50 mg at 01/15/23 1211   Or   diphenhydrAMINE (BENADRYL) injection 50 mg  50 mg Intramuscular TID PRN [name], NP       feeding supplement (ENSURE ENLIVE / ENSURE PLUS) liquid 237 mL  237 mL Oral TID BM Nkwenti, Doris, NP   237 mL at 02/28/23 2208   haloperidol (HALDOL) tablet 5 mg  5 mg Oral TID PRN [name], NP   5 mg at 01/15/23 1211   Or   haloperidol lactate (HALDOL) injection 5 mg  5 mg Intramuscular TID PRN [name], NP       hydrOXYzine (ATARAX) tablet 25 mg  25 mg Oral TID PRN [name], DO   25 mg at 02/20/23 2156   LORazepam (ATIVAN) tablet 2 mg  2 mg Oral TID PRN [name], NP   2 mg at 01/15/23 1211   Or   LORazepam (ATIVAN) injection 2 mg  2 mg Intramuscular TID PRN [name], NP       magnesium hydroxide (MILK OF MAGNESIA) suspension 30 mL  30 mL Oral Daily PRN [name], NP   30 mL at 02/26/23 1828    melatonin tablet 5 mg  5 mg Oral QHS Nkwenti, Doris, NP   5 mg at 02/28/23 2209   nicotine (NICODERM CQ - dosed in mg/24 hours) patch 21 mg  21 mg Transdermal Daily [name], DO   21 mg at 02/27/23 0830   paliperidone (INVEGA) 24 hr tablet 6 mg  6 mg Oral Daily [name], NP   6 mg at 02/28/23 2209   pantoprazole (PROTONIX) EC tablet 40 mg  40 mg Oral Daily [name], NP   40 mg at 03/01/23 0745   polyethylene glycol (MIRALAX / GLYCOLAX) packet 17 g  17 g Oral Daily PRN [name],  NP   17 g at 02/18/23 1624   sertraline (ZOLOFT) tablet 150 mg  150 mg Oral Daily Massengill, Nathan, MD   150 mg at 03/01/23 0745   traZODone (DESYREL) tablet 50 mg  50 mg Oral QHS [name], NP   50 mg at 02/28/23 2209   Vitamin D (Ergocalciferol) (DRISDOL) 1.25 MG (50000 UNIT) capsule 50,000 Units  50,000 Units Oral Q7 days [name], NP   50,000 Units at 02/23/23 2010   Lab Results: No results found for this or any previous visit (from the past 48 hour(s)).  Blood Alcohol level:  Lab Results  Component Value Date   ETH <10 12/19/2022   ETH <10 11/21/2019   Metabolic Disorder Labs: Lab Results  Component Value Date   HGBA1C 4.4 (L) 12/21/2022   MPG 79.58 12/21/2022   No results found for: "PROLACTIN" Lab Results  Component Value Date   CHOL 218 (H) 12/21/2022   TRIG 81 12/21/2022   HDL 53 12/21/2022   CHOLHDL 4.1 12/21/2022   VLDL 16 12/21/2022   LDLCALC 149 (H) 12/21/2022   LDLCALC 110 (H) 03/13/2011   Physical Findings: AIMS: Facial and Oral Movements Muscles of Facial Expression: None, normal Lips and Perioral Area: None, normal Jaw: None, normal Tongue: None, normal,Extremity Movements Upper (arms, wrists, hands, fingers): None, normal Lower (legs, knees, ankles, toes): None, normal, Trunk Movements Neck, shoulders, hips: None, normal, Overall Severity Severity of abnormal movements (highest score from questions above): None, normal Incapacitation due to  abnormal movements: None, normal Patient's awareness of abnormal movements (rate only patient's report): No Awareness, Dental Status Current problems with teeth and/or dentures?: No Does patient usually wear dentures?: No  CIWA:    COWS:    AIMS:0 Musculoskeletal: Strength & Muscle Tone: within normal limits Gait & Station: normal Patient leans: N/A  Psychiatric Specialty Exam:  Presentation  General Appearance:  Appropriate for Environment; Fairly Groomed  Eye Contact: Fair  Speech: Garbled  Speech Volume: Normal  Handedness: Right  Mood and Affect  Mood: Depressed; Anxious  Affect: Congruent  Thought Process  Thought Processes: Coherent  Descriptions of Associations:Intact  Orientation:Partial  Thought Content:Logical  History of Schizophrenia/Schizoaffective disorder:Yes  Duration of Psychotic Symptoms:Greater than six months  Hallucinations:No data recorded   Ideas of Reference:None  Suicidal Thoughts:No data recorded   Homicidal Thoughts:No data recorded   Sensorium  Memory: Immediate Good  Judgment: Poor  Insight: Poor  Executive Functions  Concentration: Poor  Attention Span: Poor  Recall: Poor  Fund of Knowledge: Poor  Language: Fair  Psychomotor Activity  Psychomotor Activity: No data recorded   Assets  Assets: Resilience  Sleep  Sleep: No data recorded   Physical Exam: Physical Exam Vitals and nursing note reviewed.  Constitutional:      Appearance: Normal appearance.  HENT:     Nose: Nose normal.  Eyes:     Pupils: Pupils are equal, round, and reactive to light.  Pulmonary:     Effort: Pulmonary effort is normal.  Genitourinary:    Comments: Deferred Musculoskeletal:        General: Normal range of motion.     Cervical back: Normal range of motion.  Neurological:     Mental Status: She is alert and oriented to person, place, and time.    Review of Systems  Constitutional:  Negative  for fever.  HENT:  Negative for hearing loss.   Eyes:  Negative for blurred vision.  Respiratory:  Negative for cough.   Cardiovascular:  Negative for chest pain.  Gastrointestinal:  Negative for heartburn.  Genitourinary:  Negative for dysuria.  Musculoskeletal:  Negative for myalgias.  Skin:  Negative for rash.  Neurological:  Negative for dizziness.  Psychiatric/Behavioral:  Positive for depression. Negative for hallucinations, memory loss, substance abuse and suicidal ideas. The patient is nervous/anxious and has insomnia.    Blood pressure 90/79, pulse 90, temperature 97.9 F (36.6 C), temperature source Oral, resp. rate 17, height 4\' 11"  (1.499 m), weight 63 kg, SpO2 100%. Body mass index is 28.05 kg/m.  Treatment Plan Summary: Daily contact with patient to assess and evaluate symptoms and progress in treatment and Medication management.    Still waiting on safe disposition as patient would not be able to live independently given cognitive impairment. Recommend assisted living.    No medication side effects.  Because of urinary incontinence, recommended adult diapers to be worn.   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  Plan:  -Continue Vitamin D 50.000 units weekly for bone health. -Continue Sertraline150 mg po Q daily for depression/anxiety -Continue Buspar 15 mg po bid for anxiety.  -Continue paliperidone 6 mg po qd for mood stabilization -Continue Trazodone 50 mg nightly for insomnia  -Continue Nicoderm 21 mg topically Q 24 hrs for nicotine withdrawal management. -Continue vitamin B12 1000 mcg IM weekly for 4 weeks then monthly -Continue Melatonin 5 mg nightly for sleep -Continue Protonix EC 40 mg p.o.  daily for GERD -Continue MiraLAX 17 g p.o. PRN for constipation -Continue hydroxyzine 25 mg p.o. 3 times daily as needed for anxiety -Continue Ensure nutritional shakes TID in between meals  -Previously discontinued Hydroxyzine 50 mg nightly-hypotension in the mornings   Safety and Monitoring: Voluntary admission to inpatient psychiatric unit for safety, stabilization and treatment Daily contact with patient to assess and evaluate symptoms and progress in treatment Patient's case to be discussed in multi-disciplinary team meeting Observation Level : q15 minute checks Vital signs: q12 hours Precautions: Safety   Discharge Planning: Social work and case management to assist with discharge planning and identification of hospital follow-up needs prior to discharge Estimated LOS: Unknown at this time. Discharge Concerns: Need to establish a safety plan; Medication compliance and effectiveness Discharge Goals: Return home with outpatient referrals for mental health follow-up including medication management/psychotherapy No legal guardian, has payee  Armandina Stammer, NP, pmhnp, fnp-bc 03/01/2023, 8:37 AM Patient ID: Yolanda [name], female   DOB: 1975-09-05, 47 y.o.   MRN: [id number] Patient ID: VERNELLA STUDENT, female   DOB: [birth year], 47 y.o.   MRN: [id number]

## 2023-03-01 NOTE — [facility] Group Notes (Signed)
[facility] Group Notes:  (Nursing/MHT/Case Management/Adjunct)  Date:  03/01/2023  Time:  9:24 PM  Type of Therapy:   Wrap-up group  Participation Level:  Active  Participation Quality:  Appropriate  Affect:  Appropriate  Cognitive:  Appropriate  Insight:  Appropriate  Engagement in Group:  Engaged  Modes of Intervention:  Education  Summary of Progress/Problems: Pt goal to leave here. Pt became emotional when stating her goal. Rated day 9/10.  Noah Delaine 03/01/2023, 9:24 PM

## 2023-03-01 NOTE — Plan of Care (Signed)
  Problem: Coping: Goal: Will verbalize feelings Outcome: Progressing   Problem: Medication: Goal: Compliance with prescribed medication regimen will improve Outcome: Progressing   Problem: Safety: Goal: Periods of time without injury will increase Outcome: Progressing

## 2023-03-01 NOTE — Group Note (Signed)
Recreation Therapy Group Note   Group Topic:Relaxation  Group Date: 03/01/2023 Start Time: 1610 End Time: 1015 Facilitators: Treydon Henricks-McCall, LRT,CTRS Location: [address] Dayroom   Goal Area(s) Addresses:  Patient will identify positive stress management techniques. Patient will identify benefits of using stress management post d/c.  Group Description: Meditation.  LRT and patients discussed the importance of taking time to release stress and clear the mind. LRT played a meditation that walked patients through breathing techniques and ways to refocus on positive feelings and mindset.    Affect/Mood: N/A   Participation Level: Did not attend    Clinical Observations/Individualized Feedback:    Plan: Continue to engage patient in RT group sessions 2-3x/week.   Jevaeh Shams-McCall, LRT,CTRS 03/01/2023 11:47 AM

## 2023-03-01 NOTE — Progress Notes (Signed)
   03/01/23 2111  Psych Admission Type (Psych Patients Only)  Admission Status Involuntary  Psychosocial Assessment  Patient Complaints None  Eye Contact Brief  Facial Expression Flat  Affect Flat  Speech Soft;Slow  Interaction Childlike  Motor Activity Slow  Appearance/Hygiene Improved  Behavior Characteristics Cooperative  Mood Preoccupied  Thought Process  Coherency WDL  Content WDL  Delusions None reported or observed  Perception WDL  Hallucination None reported or observed  Judgment Poor  Confusion None  Danger to Self  Current suicidal ideation? Denies  Self-Injurious Behavior No self-injurious ideation or behavior indicators observed or expressed   Agreement Not to Harm Self Yes  Description of Agreement verbal  Danger to Others  Danger to Others None reported or observed

## 2023-03-02 DIAGNOSIS — F251 Schizoaffective disorder, depressive type: Secondary | ICD-10-CM | POA: Diagnosis not present

## 2023-03-02 NOTE — Progress Notes (Signed)
   03/02/23 0531  15 Minute Checks  Location Bedroom  Visual Appearance Calm  Behavior Composed  Sleep (Behavioral Health Patients Only)  Calculate sleep? (Click Yes once per 24 hr at 0600 safety check) Yes  Documented sleep last 24 hours 5.75

## 2023-03-02 NOTE — [facility] Group Notes (Signed)
[facility] Group Notes:  (Nursing/MHT/Case Management/Adjunct)  Date:  03/02/2023  Time:  2015 Type of Therapy:   Wrap up group  Participation Level:  Active  Participation Quality:  Appropriate, Attentive, Sharing, and Supportive  Affect:  Anxious  Cognitive:  Alert  Insight:  Limited  Engagement in Group:  Engaged  Modes of Intervention:  Clarification, Education, and Socialization  Summary of Progress/Problems: Positive thinking and self-care were discussed.   Yolanda [name] 03/02/2023, 9:13 PM

## 2023-03-02 NOTE — [facility] Group Notes (Signed)
[facility] Group Notes:  (Nursing/MHT/Case Management/Adjunct)  Date:  03/02/2023  Time: 1400  Type of Therapy:  Nurse Education  Participation Level:  Active  Participation Quality:  Appropriate  Affect:  Appropriate  Cognitive:  Appropriate  Insight:  Improving  Engagement in Group:  Improving  Modes of Intervention:  Activity, Exploration, Rapport Building, Socialization, and Support  Summary of Progress/Problems:  The focus of this group is helping patients create a visual composition (collage) representing their thoughts emotions and experiences. Selecting and arranging these elements allow individuals to tap into their subconscious and express their emotions in a non verbal and symbolic way.  Shela Nevin 03/02/2023, 7:12 PM

## 2023-03-02 NOTE — Progress Notes (Signed)
[facility] MD Progress Note  03/02/2023 10:29 AM Ranisha ANALAURA BESTER  MRN:  [id number] Principal Problem: Schizoaffective disorder, depressive type (HCC) Diagnosis: Principal Problem:   Schizoaffective disorder, depressive type (HCC) Active Problems:   GERD (gastroesophageal reflux disease)   Intellectual disability   Tobacco use disorder  Reason for admission: This is the first psychiatric admission in this [facility], [place] in 12 years for this 50 AA female with an extensive hx of mental illnesses & probable polysubstance use disorders. She is admitted to the [facility] from the [facility] hospital with complain of worsening suicidal ideations with plan to stab herself. Per chart review, patient apparently reported at the ED that she has been depressed for a while & has not been taking her mental health medications. After medical evaluation.clearance, she was transferred to the [facility] for further psychiatric evaluation/treatments.   24 hr chart review: Sleep Hours last night: 5.75 hrs as per nursing documentation. Nursing Concerns: Isolative and not attending unit group sessions Behavioral episodes in the past 24 hrs: None Medication Compliance: Compliant  Vital Signs in the past 24 hrs: WNL PRN Medications in the past 24 hrs: Miralax yesterday afternoon  Patient assessment note: Mood today is euthymic, patient is more talkative that usual. She was up early today morning, took a shower, got dressed, lay in bed, stated that she was waiting for the day room to be opened so that she can get up and go to group sessions. Attention to personal hygiene and grooming is fair, eye contact is fair, speech is somewhat garbled, but understandable. Thought contents are organized and logical, and pt currently denies SI/HI/AVH or paranoia. There is no evidence of delusional thoughts.    Pt is continuing to report a good sleep quality, reports a fair appetite, reports that she ate dinner & breakfast this morning, reports that last BM was  yesterday, denies physical pain, denies medication related side effects. We talked about tidying her room and making her bed, and inquired if she needed assistance with these tasks to which pt responded that she did not, and that she would do it on her own. Pt states that she was in attendance of unit group sessions yesterday, and will be attending today.  [name] treatment team has determined that pt is cleared psychiatrically for discharge. However, due to pt's cognitive impairments, it would be necessary for her to have a safe & structured environment outside of the hospital prior to discharge. She will be unable to function independently or in a shelter type environment. It is preferable for pt to be assigned a legal guardian by DSS who is coordinating with our CSW to find placement outside of the hospital environment. We are continuing all medications at this time with no changes. Pt denies all medication related side effects. No TD/EPS type symptoms found on assessment, and pt denies any feelings of stiffness. AIMS: 0.      Total Time spent with patient:  45 minutes  Past Psychiatric History: See H & P  Past Medical History:  Past Medical History:  Diagnosis Date   Bipolar affect, depressed (HCC)    Constipation 08/17/2022   Depression    Falls 07/21/2022   Fracture of femoral neck, right, closed (HCC) 01/17/2022   Herpes simplex 08/22/2017   Open fracture dislocation of right elbow joint 01/17/2022    Past Surgical History:  Procedure Laterality Date   NO PAST SURGERIES     SALPINGECTOMY     Family History: History reviewed. No pertinent family  history.  Family Psychiatric  History: See H & P  Social History:  Social History   Substance and Sexual Activity  Alcohol Use Yes     Social History   Substance and Sexual Activity  Drug Use Yes   Types: Cocaine, Marijuana    Social History   Socioeconomic History   Marital status: Single    Spouse name: Not on file   Number  of children: Not on file   Years of education: Not on file   Highest education level: Not on file  Occupational History   Not on file  Tobacco Use   Smoking status: Every Day   Smokeless tobacco: Not on file  Substance and Sexual Activity   Alcohol use: Yes   Drug use: Yes    Types: Cocaine, Marijuana   Sexual activity: Yes  Other Topics Concern   Not on file  Social History Narrative   Not on file   Social Determinants of Health   Financial Resource Strain: Low Risk  (09/12/2022)   Received from [facility], [facility]   Overall Financial Resource Strain (CARDIA)    Difficulty of Paying Living Expenses: Not hard at all  Food Insecurity: Patient Declined (12/20/2022)   Hunger Vital Sign    Worried About Running Out of Food in the Last Year: Patient declined    Ran Out of Food in the Last Year: Patient declined  Transportation Needs: No Transportation Needs (12/20/2022)   PRAPARE - Administrator, Civil Service (Medical): No    Lack of Transportation (Non-Medical): No  Physical Activity: Not on file  Stress: No Stress Concern Present (07/17/2022)   Received from [facility], [facility] - Occupational Stress Questionnaire    Feeling of Stress : Not at all  Social Connections: Unknown (07/16/2022)   Received from [facility], [facility]   Social Network    Social Network: Not on file   Sleep: Good  Appetite:  Good  Current Medications: Current Facility-Administered Medications  Medication Dose Route Frequency Provider Last Rate Last Admin   acetaminophen (TYLENOL) tablet 650 mg  650 mg Oral Q6H PRN [name], NP   650 mg at 02/25/23 2100   alum & mag hydroxide-simeth (MAALOX/MYLANTA) 200-200-20 MG/5ML suspension 30 mL  30 mL Oral Q4H PRN [name], NP   30 mL at 01/28/23 1530   busPIRone (BUSPAR) tablet 15 mg  15 mg Oral BID [name], NP   15 mg at 03/02/23 0758   cyanocobalamin (VITAMIN B12)  injection 1,000 mcg  1,000 mcg Intramuscular Q30 days Sarita Bottom, MD   1,000 mcg at 02/02/23 1238   diphenhydrAMINE (BENADRYL) capsule 50 mg  50 mg Oral TID PRN [name], NP   50 mg at 01/15/23 1211   Or   diphenhydrAMINE (BENADRYL) injection 50 mg  50 mg Intramuscular TID PRN [name], NP       feeding supplement (ENSURE ENLIVE / ENSURE PLUS) liquid 237 mL  237 mL Oral TID BM Harneet Noblett, NP   237 mL at 03/02/23 0800   haloperidol (HALDOL) tablet 5 mg  5 mg Oral TID PRN [name], NP   5 mg at 01/15/23 1211   Or   haloperidol lactate (HALDOL) injection 5 mg  5 mg Intramuscular TID PRN [name], NP       hydrOXYzine (ATARAX) tablet 25 mg  25 mg Oral TID PRN [name], DO  25 mg at 02/20/23 2156   LORazepam (ATIVAN) tablet 2 mg  2 mg Oral TID PRN [name], NP   2 mg at 01/15/23 1211   Or   LORazepam (ATIVAN) injection 2 mg  2 mg Intramuscular TID PRN [name], NP       magnesium hydroxide (MILK OF MAGNESIA) suspension 30 mL  30 mL Oral Daily PRN [name], NP   30 mL at 02/26/23 1828   melatonin tablet 5 mg  5 mg Oral QHS Lonzy Mato, NP   5 mg at 03/01/23 2111   nicotine (NICODERM CQ - dosed in mg/24 hours) patch 21 mg  21 mg Transdermal Daily [name], DO   21 mg at 02/27/23 0830   paliperidone (INVEGA) 24 hr tablet 6 mg  6 mg Oral Daily [name], NP   6 mg at 03/01/23 2111   pantoprazole (PROTONIX) EC tablet 40 mg  40 mg Oral Daily [name], NP   40 mg at 03/02/23 0758   polyethylene glycol (MIRALAX / GLYCOLAX) packet 17 g  17 g Oral Daily PRN [name], NP   17 g at 03/01/23 1352   sertraline (ZOLOFT) tablet 150 mg  150 mg Oral Daily Massengill, Harrold Donath, MD   150 mg at 03/02/23 0758   traZODone (DESYREL) tablet 50 mg  50 mg Oral QHS [name], NP   50 mg at 03/01/23 2111   Vitamin D (Ergocalciferol) (DRISDOL) 1.25 MG (50000 UNIT) capsule 50,000 Units  50,000 Units Oral Q7 days [name], NP   50,000 Units at  02/23/23 2010   Lab Results: No results found for this or any previous visit (from the past 48 hour(s)).  Blood Alcohol level:  Lab Results  Component Value Date   ETH <10 12/19/2022   ETH <10 11/21/2019   Metabolic Disorder Labs: Lab Results  Component Value Date   HGBA1C 4.4 (L) 12/21/2022   MPG 79.58 12/21/2022   No results found for: "PROLACTIN" Lab Results  Component Value Date   CHOL 218 (H) 12/21/2022   TRIG 81 12/21/2022   HDL 53 12/21/2022   CHOLHDL 4.1 12/21/2022   VLDL 16 12/21/2022   LDLCALC 149 (H) 12/21/2022   LDLCALC 110 (H) 03/13/2011   Physical Findings: AIMS: Facial and Oral Movements Muscles of Facial Expression: None, normal Lips and Perioral Area: None, normal Jaw: None, normal Tongue: None, normal,Extremity Movements Upper (arms, wrists, hands, fingers): None, normal Lower (legs, knees, ankles, toes): None, normal, Trunk Movements Neck, shoulders, hips: None, normal, Overall Severity Severity of abnormal movements (highest score from questions above): None, normal Incapacitation due to abnormal movements: None, normal Patient's awareness of abnormal movements (rate only patient's report): No Awareness, Dental Status Current problems with teeth and/or dentures?: No Does patient usually wear dentures?: No  CIWA:    COWS:    AIMS:0 Musculoskeletal: Strength & Muscle Tone: within normal limits Gait & Station: normal Patient leans: N/A  Psychiatric Specialty Exam:  Presentation  General Appearance:  Appropriate for Environment; Casual; Fairly Groomed  Eye Contact: Fair  Speech: Clear and Coherent; Normal Rate  Speech Volume: Normal  Handedness: Right  Mood and Affect  Mood: Euthymic  Affect: Congruent  Thought Process  Thought Processes: Coherent  Descriptions of Associations:Intact  Orientation:Partial  Thought Content:Logical  History of Schizophrenia/Schizoaffective disorder:Yes  Duration of Psychotic  Symptoms:Greater than six months  Hallucinations:Hallucinations: None    Ideas of Reference:None  Suicidal Thoughts:Suicidal Thoughts: No    Homicidal Thoughts:Homicidal Thoughts:  No    Sensorium  Memory: Immediate Good  Judgment: Poor  Insight: Poor  Executive Functions  Concentration: Fair  Attention Span: Fair  Recall: Poor  Fund of Knowledge: Poor  Language: Fair  Lexicographer Activity: Psychomotor Activity: Normal    Assets  Assets: Resilience  Sleep  Sleep: Sleep: Good    Physical Exam: Physical Exam Vitals and nursing note reviewed.  Constitutional:      Appearance: Normal appearance.  HENT:     Nose: Nose normal.  Eyes:     Pupils: Pupils are equal, round, and reactive to light.  Pulmonary:     Effort: Pulmonary effort is normal.  Genitourinary:    Comments: Deferred Musculoskeletal:        General: Normal range of motion.     Cervical back: Normal range of motion.  Neurological:     Mental Status: She is alert and oriented to person, place, and time.    Review of Systems  Constitutional:  Negative for fever.  HENT:  Negative for hearing loss.   Eyes:  Negative for blurred vision.  Respiratory:  Negative for cough.   Cardiovascular:  Negative for chest pain.  Gastrointestinal:  Negative for heartburn.  Genitourinary:  Negative for dysuria.  Musculoskeletal:  Negative for myalgias.  Skin:  Negative for rash.  Neurological:  Negative for dizziness.  Psychiatric/Behavioral:  Positive for depression. Negative for hallucinations, memory loss, substance abuse and suicidal ideas. The patient is nervous/anxious and has insomnia.    Blood pressure 93/76, pulse 87, temperature 98.3 F (36.8 C), temperature source Oral, resp. rate 16, height 4\' 11"  (1.499 m), weight 63 kg, SpO2 99%. Body mass index is 28.05 kg/m.  Treatment Plan Summary: Daily contact with patient to assess and evaluate symptoms and  progress in treatment and Medication management.    Still waiting on safe disposition as patient would not be able to live independently given cognitive impairment. Recommend assisted living.    No medication side effects.  Because of urinary incontinence, recommended adult diapers to be worn.   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  Plan:  -Continue Vitamin D 50.000 units weekly for bone health. -Continue Sertraline150 mg po Q daily for depression/anxiety -Continue Buspar 15 mg po bid for anxiety.  -Continue paliperidone 6 mg po qd for mood stabilization -Continue Trazodone 50 mg nightly for insomnia  -Continue Nicoderm 21 mg topically Q 24 hrs for nicotine withdrawal management. -Continue vitamin B12 1000 mcg IM weekly for 4 weeks then monthly -Continue Melatonin 5 mg nightly for sleep -Continue Protonix EC 40 mg p.o. daily for GERD -Continue MiraLAX 17 g p.o. PRN for constipation -Continue hydroxyzine 25 mg p.o. 3 times daily as needed for anxiety -Continue Ensure nutritional shakes TID in between meals  -Previously discontinued Hydroxyzine 50 mg nightly-hypotension in the mornings   Safety and Monitoring: Voluntary admission to inpatient psychiatric unit for safety, stabilization and treatment Daily contact with patient to assess and evaluate symptoms and progress in treatment Patient's case to be discussed in multi-disciplinary team meeting Observation Level : q15 minute checks Vital signs: q12 hours Precautions: Safety   Discharge Planning: Social work and case management to assist with discharge planning and identification of hospital follow-up needs prior to discharge Estimated LOS: Unknown at this  time.  Discharge Concerns: Need to establish a safety plan; Medication compliance and effectiveness Discharge Goals: Return home with outpatient referrals for mental health follow-up including medication management/psychotherapy No legal guardian, has payee  [name], NP, pmhnp 03/02/2023, 10:29 AM Patient ID: Larey Brick, female   DOB: [birth year], 47 y.o.   MRN: [id number]

## 2023-03-02 NOTE — Progress Notes (Signed)
Pt upset at receiving no visitors, MHT found her crying in room.  MHT offered to let Pt use hospital mobile phone to call family, and Pt did this.  Pt seemed much happier after call.   03/02/23 2302  Psych Admission Type (Psych Patients Only)  Admission Status Involuntary  Psychosocial Assessment  Patient Complaints Helplessness;Hopelessness;Crying spells;Depression  Eye Contact Brief  Facial Expression Sad  Affect Sad;Preoccupied  Speech Soft  Interaction Childlike  Motor Activity Fidgety  Appearance/Hygiene Disheveled  Behavior Characteristics Appropriate to situation  Mood Preoccupied;Sad  Thought Process  Coherency WDL  Content WDL  Delusions None reported or observed  Perception WDL  Hallucination None reported or observed  Judgment Poor  Confusion None  Danger to Self  Current suicidal ideation? Denies  Self-Injurious Behavior No self-injurious ideation or behavior indicators observed or expressed   Agreement Not to Harm Self Yes  Description of Agreement verbal  Danger to Others  Danger to Others None reported or observed

## 2023-03-02 NOTE — BHH Group Notes (Signed)
Pt did not attend goals group. 

## 2023-03-02 NOTE — [facility] Group Notes (Signed)
LCSW Wellness Group Note   03/02/2023 1:00pm  Type of Group and Topic: Psychoeducational Group:  Wellness  Participation Level:  did not attend  Description of Group  Wellness group introduces the topic and its focus on developing healthy habits across the spectrum and its relationship to a decrease in hospital admissions.  Six areas of wellness are discussed: physical, social spiritual, intellectual, occupational, and emotional.  Patients are asked to consider their current wellness habits and to identify areas of wellness where they are interested and able to focus on improvements.    Therapeutic Goals Patients will understand components of wellness and how they can positively impact overall health.  Patients will identify areas of wellness where they have developed good habits. Patients will identify areas of wellness where they would like to make improvements.    Summary of Patient Progress     Therapeutic Modalities: Cognitive Behavioral Therapy Psychoeducation    Lorri Frederick, LCSW

## 2023-03-02 NOTE — Plan of Care (Signed)
  Problem: Health Behavior/Discharge Planning: Goal: Ability to make decisions will improve Outcome: Progressing Goal: Compliance with therapeutic regimen will improve Outcome: Progressing

## 2023-03-02 NOTE — Progress Notes (Signed)
Yolanda [name] slept for 5.75 hrs last night. Pt took a shower this a.m. and was observed reading a book in her room. Pt denies SI/HI/AVH. Pt was guarded on approach, no new c/o's. Pt remains safe.

## 2023-03-03 DIAGNOSIS — F251 Schizoaffective disorder, depressive type: Secondary | ICD-10-CM | POA: Diagnosis not present

## 2023-03-03 NOTE — Progress Notes (Signed)
Pt very sad about being here so long, spoke about her children and her daughter's new apartment.  Spoke also about missing her 49 year old grandson.     03/03/23 2250  Psych Admission Type (Psych Patients Only)  Admission Status Involuntary  Psychosocial Assessment  Patient Complaints Depression  Eye Contact Fair  Facial Expression Animated  Affect Appropriate to circumstance  Speech Logical/coherent  Interaction Childlike;Guarded  Motor Activity Rigidity  Appearance/Hygiene Disheveled  Behavior Characteristics Appropriate to situation  Mood Preoccupied;Sad  Thought Process  Coherency WDL  Content WDL  Delusions None reported or observed  Perception WDL  Hallucination None reported or observed  Judgment Limited  Confusion None  Danger to Self  Current suicidal ideation? Denies  Self-Injurious Behavior No self-injurious ideation or behavior indicators observed or expressed   Agreement Not to Harm Self Yes  Description of Agreement verbal  Danger to Others  Danger to Others None reported or observed

## 2023-03-03 NOTE — Progress Notes (Signed)
[facility] MD Progress Note  03/03/2023 10:37 AM Shia SOMIA MAXIN  MRN:  [id number] Principal Problem: Schizoaffective disorder, depressive type (HCC) Diagnosis: Principal Problem:   Schizoaffective disorder, depressive type (HCC) Active Problems:   GERD (gastroesophageal reflux disease)   Intellectual disability   Tobacco use disorder  Reason for admission: This is the first psychiatric admission in this [facility] in 12 years for this 21 AA female with an extensive hx of mental illnesses & probable polysubstance use disorders. She is admitted to the [facility] from the [facility] hospital with complain of worsening suicidal ideations with plan to stab herself. Per chart review, patient apparently reported at the ED that she has been depressed for a while & has not been taking her mental health medications. After medical evaluation.clearance, she was transferred to the [facility] for further psychiatric evaluation/treatments.   24 hr chart review: Sleep Hours last night: 6.5 hrs as per nursing documentation. Nursing Concerns: Remains Isolative, but attended last night's group session as per nursing documentation Behavioral episodes in the past 24 hrs: None Medication Compliance: Compliant  Vital Signs in the past 24 hrs: WNL PRN Medications in the past 24 hrs: Miralax yesterday afternoon  Patient assessment note: Mood today is depressed, and pt is in bed with sheets wrapped around her during encounter. She maintains limited eye contact, and speech is somewhat garbled, which is normal for her, but low, requiring multiple positive reinforcements for her to repeat herself in a way that is audible enough to be comprehended.Attention to personal hygiene and grooming is poor, pt is preferring to sleep in bed with no linens on it, declines staff assistance to make her bed, states that she will make it herself, but does not make it. Positive reinforcements given for pt to get out of bed and take care of personal hygiene needs  and get out of the room in order to attend the unit group sessions.   Patient reports feeling depressed related to still being hospitalized. She denies SI/HI/AVH, denies paranoia, states that she is eating well, sleep is good. Denies medication related side effects, denies any concerns with bowel movements.  We continue to await DSS to coordinate with pt's treatment team for placement in a living arrangement outside of this hospital that would be beneficial for her, as she is unable to function on her own due to her intellectual limitations. It is preferable for pt to be assigned a legal guardian by DSS. We are continuing all medications at this time with no changes. Pt denies all medication related side effects. No TD/EPS type symptoms found on assessment, and pt denies any feelings of stiffness. AIMS: 0.      Total Time spent with patient:  45 minutes  Past Psychiatric History: See H & P  Past Medical History:  Past Medical History:  Diagnosis Date   Bipolar affect, depressed (HCC)    Constipation 08/17/2022   Depression    Falls 07/21/2022   Fracture of femoral neck, right, closed (HCC) 01/17/2022   Herpes simplex 08/22/2017   Open fracture dislocation of right elbow joint 01/17/2022    Past Surgical History:  Procedure Laterality Date   NO PAST SURGERIES     SALPINGECTOMY     Family History: History reviewed. No pertinent family history.  Family Psychiatric  History: See H & P  Social History:  Social History   Substance and Sexual Activity  Alcohol Use Yes     Social History   Substance and Sexual Activity  Drug Use Yes   Types: Cocaine, Marijuana    Social History   Socioeconomic History   Marital status: Single    Spouse name: Not on file   Number of children: Not on file   Years of education: Not on file   Highest education level: Not on file  Occupational History   Not on file  Tobacco Use   Smoking status: Every Day   Smokeless tobacco: Not on file   Substance and Sexual Activity   Alcohol use: Yes   Drug use: Yes    Types: Cocaine, Marijuana   Sexual activity: Yes  Other Topics Concern   Not on file  Social History Narrative   Not on file   Social Determinants of Health   Financial Resource Strain: Low Risk  (09/12/2022)   Received from [facility], [facility]   Overall Financial Resource Strain (CARDIA)    Difficulty of Paying Living Expenses: Not hard at all  Food Insecurity: Patient Declined (12/20/2022)   Hunger Vital Sign    Worried About Running Out of Food in the Last Year: Patient declined    Ran Out of Food in the Last Year: Patient declined  Transportation Needs: No Transportation Needs (12/20/2022)   PRAPARE - Administrator, Civil Service (Medical): No    Lack of Transportation (Non-Medical): No  Physical Activity: Not on file  Stress: No Stress Concern Present (07/17/2022)   Received from [facility], [facility] - Occupational Stress Questionnaire    Feeling of Stress : Not at all  Social Connections: Unknown (07/16/2022)   Received from [facility], [facility]   Social Network    Social Network: Not on file   Sleep: Good  Appetite:  Good  Current Medications: Current Facility-Administered Medications  Medication Dose Route Frequency Provider Last Rate Last Admin   acetaminophen (TYLENOL) tablet 650 mg  650 mg Oral Q6H PRN [name], NP   650 mg at 02/25/23 2100   alum & mag hydroxide-simeth (MAALOX/MYLANTA) 200-200-20 MG/5ML suspension 30 mL  30 mL Oral Q4H PRN [name], NP   30 mL at 01/28/23 1530   busPIRone (BUSPAR) tablet 15 mg  15 mg Oral BID [name], NP   15 mg at 03/03/23 0756   cyanocobalamin (VITAMIN B12) injection 1,000 mcg  1,000 mcg Intramuscular Q30 days Sarita Bottom, MD   1,000 mcg at 02/02/23 1238   diphenhydrAMINE (BENADRYL) capsule 50 mg  50 mg Oral TID PRN [name], NP   50 mg at 01/15/23 1211   Or    diphenhydrAMINE (BENADRYL) injection 50 mg  50 mg Intramuscular TID PRN [name], NP       feeding supplement (ENSURE ENLIVE / ENSURE PLUS) liquid 237 mL  237 mL Oral TID BM Oiva Dibari, NP   237 mL at 03/03/23 1021   haloperidol (HALDOL) tablet 5 mg  5 mg Oral TID PRN [name], NP   5 mg at 01/15/23 1211   Or   haloperidol lactate (HALDOL) injection 5 mg  5 mg Intramuscular TID PRN [name], NP       hydrOXYzine (ATARAX) tablet 25 mg  25 mg Oral TID PRN [name], DO   25 mg at 02/20/23 2156   LORazepam (ATIVAN) tablet 2 mg  2 mg Oral TID PRN [name], NP   2 mg at 01/15/23 1211   Or   LORazepam (ATIVAN) injection 2  mg  2 mg Intramuscular TID PRN [name], NP       magnesium hydroxide (MILK OF MAGNESIA) suspension 30 mL  30 mL Oral Daily PRN [name], NP   30 mL at 02/26/23 1828   melatonin tablet 5 mg  5 mg Oral QHS Alaze Garverick, NP   5 mg at 03/02/23 2111   nicotine (NICODERM CQ - dosed in mg/24 hours) patch 21 mg  21 mg Transdermal Daily [name], DO   21 mg at 02/27/23 0830   paliperidone (INVEGA) 24 hr tablet 6 mg  6 mg Oral Daily [name], NP   6 mg at 03/02/23 2111   pantoprazole (PROTONIX) EC tablet 40 mg  40 mg Oral Daily [name], NP   40 mg at 03/03/23 0756   polyethylene glycol (MIRALAX / GLYCOLAX) packet 17 g  17 g Oral Daily PRN [name], NP   17 g at 03/01/23 1352   sertraline (ZOLOFT) tablet 150 mg  150 mg Oral Daily Massengill, Harrold Donath, MD   150 mg at 03/03/23 0756   traZODone (DESYREL) tablet 50 mg  50 mg Oral QHS [name], NP   50 mg at 03/02/23 2111   Vitamin D (Ergocalciferol) (DRISDOL) 1.25 MG (50000 UNIT) capsule 50,000 Units  50,000 Units Oral Q7 days [name], NP   50,000 Units at 03/02/23 1607   Lab Results: No results found for this or any previous visit (from the past 48 hour(s)).  Blood Alcohol level:  Lab Results  Component Value Date   ETH <10 12/19/2022   ETH <10 11/21/2019    Metabolic Disorder Labs: Lab Results  Component Value Date   HGBA1C 4.4 (L) 12/21/2022   MPG 79.58 12/21/2022   No results found for: "PROLACTIN" Lab Results  Component Value Date   CHOL 218 (H) 12/21/2022   TRIG 81 12/21/2022   HDL 53 12/21/2022   CHOLHDL 4.1 12/21/2022   VLDL 16 12/21/2022   LDLCALC 149 (H) 12/21/2022   LDLCALC 110 (H) 03/13/2011   Physical Findings: AIMS: Facial and Oral Movements Muscles of Facial Expression: None, normal Lips and Perioral Area: None, normal Jaw: None, normal Tongue: None, normal,Extremity Movements Upper (arms, wrists, hands, fingers): None, normal Lower (legs, knees, ankles, toes): None, normal, Trunk Movements Neck, shoulders, hips: None, normal, Overall Severity Severity of abnormal movements (highest score from questions above): None, normal Incapacitation due to abnormal movements: None, normal Patient's awareness of abnormal movements (rate only patient's report): No Awareness, Dental Status Current problems with teeth and/or dentures?: No Does patient usually wear dentures?: No  CIWA:    COWS:    AIMS:0 Musculoskeletal: Strength & Muscle Tone: within normal limits Gait & Station: normal Patient leans: N/A  Psychiatric Specialty Exam:  Presentation  General Appearance:  Disheveled  Eye Contact: Minimal  Speech: Garbled  Speech Volume: Decreased  Handedness: Right  Mood and Affect  Mood: Depressed  Affect: Congruent  Thought Process  Thought Processes: Coherent  Descriptions of Associations:Intact  Orientation:Partial  Thought Content:Illogical  History of Schizophrenia/Schizoaffective disorder:Yes  Duration of Psychotic Symptoms:Greater than six months  Hallucinations:Hallucinations: None  Ideas of Reference:None  Suicidal Thoughts:Suicidal Thoughts: No  Homicidal Thoughts:Homicidal Thoughts: No  Sensorium  Memory: Immediate Good  Judgment: Poor  Insight: Poor  Executive  Functions  Concentration: Poor  Attention Span: Poor  Recall: Poor  Fund of Knowledge: Poor  Language: Fair  Psychomotor Activity  Psychomotor Activity: Psychomotor Activity: Normal    Assets  Assets: Resilience  Sleep  Sleep: Sleep: Fair    Physical Exam: Physical Exam Vitals and nursing note reviewed.  Constitutional:      Appearance: Normal appearance.  HENT:     Nose: Nose normal.  Eyes:     Pupils: Pupils are equal, round, and reactive to light.  Pulmonary:     Effort: Pulmonary effort is normal.  Genitourinary:    Comments: Deferred Musculoskeletal:        General: Normal range of motion.     Cervical back: Normal range of motion.  Neurological:     Mental Status: She is alert and oriented to person, place, and time.    Review of Systems  Constitutional:  Negative for fever.  HENT:  Negative for hearing loss.   Eyes:  Negative for blurred vision.  Respiratory:  Negative for cough.   Cardiovascular:  Negative for chest pain.  Gastrointestinal:  Negative for heartburn.  Genitourinary:  Negative for dysuria.  Musculoskeletal:  Negative for myalgias.  Skin:  Negative for rash.  Neurological:  Negative for dizziness.  Psychiatric/Behavioral:  Positive for depression. Negative for hallucinations, memory loss, substance abuse and suicidal ideas. The patient is nervous/anxious and has insomnia.    Blood pressure 97/80, pulse 99, temperature 97.8 F (36.6 C), temperature source Oral, resp. rate 16, height 4\' 11"  (1.499 m), weight 63 kg, SpO2 97%. Body mass index is 28.05 kg/m.  Treatment Plan Summary: Daily contact with patient to assess and evaluate symptoms and progress in treatment and Medication management.    Still waiting on safe disposition as patient would not be able to live independently given cognitive impairment. Recommend assisted living.    No medication side effects.  Because of urinary incontinence, recommended adult diapers to  be worn.   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  Plan:  -Continue Vitamin D 50.000 units weekly for bone health. -Continue Sertraline150 mg po Q daily for depression/anxiety -Continue Buspar 15 mg po bid for anxiety.  -Continue paliperidone 6 mg po qd for mood stabilization -Continue Trazodone 50 mg nightly for insomnia  -Continue Nicoderm 21 mg topically Q 24 hrs for nicotine withdrawal management. -Continue vitamin B12 1000 mcg IM weekly for 4 weeks then monthly -Continue Melatonin 5 mg nightly for sleep -Continue Protonix EC 40 mg p.o. daily for GERD -Continue MiraLAX 17 g p.o. PRN for constipation -Continue hydroxyzine 25 mg p.o. 3 times daily as needed for anxiety -Continue Ensure nutritional shakes TID in between meals  -Previously discontinued Hydroxyzine 50 mg nightly-hypotension in the mornings   Safety and Monitoring: Voluntary admission to inpatient psychiatric unit for safety, stabilization and treatment Daily contact with patient to assess and evaluate symptoms and progress in treatment Patient's case to be discussed in multi-disciplinary team meeting Observation Level : q15 minute checks Vital signs: q12 hours Precautions: Safety   Discharge Planning: Social work and case management to assist with discharge planning and identification of hospital follow-up needs prior to discharge Estimated LOS: Unknown at this time. Discharge Concerns: Need to establish a safety plan; Medication compliance and effectiveness Discharge Goals: Return home with outpatient referrals for mental health follow-up including medication management/psychotherapy No legal guardian, has payee  [name], NP, pmhnp 03/03/2023,  10:37 AM Patient ID: Larey Brick, female   DOB: [birth year], 47 y.o.  MRN: [id number]

## 2023-03-03 NOTE — Plan of Care (Signed)
  Problem: Education: Goal: Utilization of techniques to improve thought processes will improve Outcome: Progressing   Problem: Activity: Goal: Interest or engagement in leisure activities will improve Outcome: Progressing   Problem: Coping: Goal: Coping ability will improve Outcome: Progressing   

## 2023-03-03 NOTE — Progress Notes (Signed)
   03/03/23 1700  Psych Admission Type (Psych Patients Only)  Admission Status Involuntary  Psychosocial Assessment  Patient Complaints Depression;Hopelessness  Eye Contact Brief  Facial Expression Sad;Pensive  Affect Depressed  Speech Soft  Interaction Childlike;Guarded  Motor Activity Fidgety  Appearance/Hygiene Disheveled  Behavior Characteristics Appropriate to situation  Mood Preoccupied;Sad  Aggressive Behavior  Effect No apparent injury  Thought Process  Coherency WDL  Content WDL  Delusions None reported or observed  Perception WDL  Hallucination None reported or observed  Judgment Poor  Confusion WDL  Danger to Self  Current suicidal ideation? Denies  Description of Suicide Plan n/a  Self-Injurious Behavior No self-injurious ideation or behavior indicators observed or expressed   Agreement Not to Harm Self Yes  Description of Agreement verbal  Danger to Others  Danger to Others None reported or observed

## 2023-03-03 NOTE — Plan of Care (Signed)
  Problem: Activity: Goal: Interest or engagement in leisure activities will improve Outcome: Progressing   Problem: Coping: Goal: Coping ability will improve Outcome: Progressing   

## 2023-03-03 NOTE — Group Note (Signed)
Date:  03/03/2023 Time:  8:27 PM  Group Topic/Focus:  Goals Group:   The focus of this group is to help patients establish daily goals to achieve during treatment and discuss how the patient can incorporate goal setting into their daily lives to aide in recovery. Wrap-Up Group:   The focus of this group is to help patients review their daily goal of treatment and discuss progress on daily workbooks.    Participation Level:  Did Not Attend  Insight: None  Engagement in Group:  None  Modes of Intervention:  Discussion  Additional Comments:    Osker Mason 03/03/2023, 8:27 PM

## 2023-03-03 NOTE — Progress Notes (Signed)
   03/03/23 0530  15 Minute Checks  Location Bedroom  Visual Appearance Calm  Behavior Sleeping  Sleep (Behavioral Health Patients Only)  Calculate sleep? (Click Yes once per 24 hr at 0600 safety check) Yes  Documented sleep last 24 hours 6.5

## 2023-03-04 ENCOUNTER — Ambulatory Visit (HOSPITAL_COMMUNITY): Payer: MEDICAID | Admitting: Clinical

## 2023-03-04 ENCOUNTER — Encounter (HOSPITAL_COMMUNITY): Payer: Self-pay

## 2023-03-04 DIAGNOSIS — F251 Schizoaffective disorder, depressive type: Secondary | ICD-10-CM | POA: Diagnosis not present

## 2023-03-04 MED ORDER — METHYLPHENIDATE HCL 10 MG PO TABS
5.0000 mg | ORAL_TABLET | Freq: Every day | ORAL | Status: DC
  Administered 2023-03-05 – 2023-03-14 (×4): 5 mg via ORAL
  Filled 2023-03-04 (×5): qty 1

## 2023-03-04 NOTE — Progress Notes (Signed)
   03/04/23 2100  Psych Admission Type (Psych Patients Only)  Admission Status Involuntary  Psychosocial Assessment  Patient Complaints Anxiety;Depression;Hopelessness  Eye Contact Fair  Facial Expression Sad  Affect Appropriate to circumstance  Speech Logical/coherent  Interaction Childlike;Minimal  Motor Activity Rigidity  Appearance/Hygiene Unremarkable  Behavior Characteristics Appropriate to situation  Mood Preoccupied;Sad  Thought Process  Coherency WDL  Content WDL  Delusions None reported or observed  Perception WDL  Hallucination None reported or observed  Judgment Limited  Confusion None  Danger to Self  Current suicidal ideation? Denies  Self-Injurious Behavior No self-injurious ideation or behavior indicators observed or expressed   Agreement Not to Harm Self Yes  Description of Agreement verbal  Danger to Others  Danger to Others None reported or observed

## 2023-03-04 NOTE — Group Note (Signed)
Recreation Therapy Group Note   Group Topic:Stress Management  Group Date: 03/04/2023 Start Time: 0940 End Time: 1000 Facilitators: Antrell Tipler-McCall, LRT,CTRS Location: [address] Dayroom   Goal Area(s) Addresses:  Patient will identify positive stress management techniques. Patient will identify benefits of using stress management post d/c.   Group Description: Meditation. LRT played a meditation that focused on not holding yourself back and envision the life you want for your future self. It also talked about letting go of the bad habits/roadblocks that keep you from reaching your full potential and reaching that future vision of yourself.    Affect/Mood: N/A   Participation Level: Did not attend    Clinical Observations/Individualized Feedback:     Plan: Continue to engage patient in RT group sessions 2-3x/week.   Maritsa Hunsucker-McCall, LRT,CTRS 03/04/2023 12:04 PM

## 2023-03-04 NOTE — Progress Notes (Signed)
   03/04/23 0534  15 Minute Checks  Location Bedroom  Visual Appearance Calm  Behavior Sleeping  Sleep (Behavioral Health Patients Only)  Calculate sleep? (Click Yes once per 24 hr at 0600 safety check) Yes  Documented sleep last 24 hours 7.5

## 2023-03-04 NOTE — Progress Notes (Cosign Needed Addendum)
[facility] MD Progress Note  03/04/2023 4:57 PM Yolanda [name]  MRN:  [id number] Principal Problem: Schizoaffective disorder, depressive type (HCC) Diagnosis: Principal Problem:   Schizoaffective disorder, depressive type (HCC) Active Problems:   GERD (gastroesophageal reflux disease)   Intellectual disability   Tobacco use disorder  Reason for admission: This is the first psychiatric admission in this [facility] in 12 years for this 47 AA female with an extensive hx of mental illnesses & probable polysubstance use disorders. She is admitted to the [facility] from the [facility] hospital with complain of worsening suicidal ideations with plan to stab herself. Per chart review, patient apparently reported at the ED that she has been depressed for a while & has not been taking her mental health medications. After medical evaluation.clearance, she was transferred to the [facility] for further psychiatric evaluation/treatments.   24 hr chart review: Sleep Hours last night: 7.5 hrs as per nursing documentation. Nursing Concerns: Isolative in room Behavioral episodes in the past 24 hrs:"Childlike;Guarded, Preoccupied;Sad, depression." Medication Compliance: Compliant  Vital Signs in the past 24 hrs: WNL PRN Medications in the past 24 hrs: Hydroxyzine  Patient assessment note: Mood remains very depressed & dysphoric, pt is guarded today, lying in bed in the fetal position with bed covers over her head and requiring multiple verbal prompts to take the covers off her head when writer entered room for assessment. She reports a low energy level, reports poor motivation to get out of bed to engage in activities with peers or take care of personal hygiene needs, states that she has not taken a shower or brushed her teeth since morning. She reports a poor appetite, states that she did not eat breakfast earlier today morning, and states that she is not feeling hungry. Pt provided with positive verbal reinforcements to get up and take  care of personal hygiene and grooming, and engage in activities on the unit. Eye contact is very minimal during encounter, and pt rarely maintains eye contact during encounter. Empathy and active listening provided.   Pt denies SI, denies HI, denies AVH, denies paranoia continues to reports feeling very depressed due to continuous hospitalization, states that she feels as though her family has abandoned her. Rates depression as 8, 10 being worst, rates anxiety as 8, 10 being worst, but these are situational, related to being hospitalized, and inability to have a structured space of her own outside of the hospital setting. Pt will require a safe and structured environment prior to discharge d/t her intellectual diabilities which render her vulnerable if discharged without a safe, structured place to go to. Family is unable to accommodate patient's needs at this time.  We continue to await DSS to coordinate with pt's treatment team for placement in a living arrangement outside of this hospital that would be beneficial for her.  It is preferable for pt to be assigned a legal guardian by DSS. CSW is in discussions with DSS regarding this. We are continuing all medications as listed below, and adding Ritalin 5 mg daily starting tomorrow morning, 8/27 for help with stimulation, energize & help with wakefulness during the day. Pt denies all medication related side effects. No TD/EPS type symptoms found on assessment, and pt denies any feelings of stiffness. AIMS: 0.   Pt is reporting a poor sleep quality, but this is because she is in bed for most of the day sleeping. She is not in need of adjustment of her sleep meds at this time. Nursing educated on the need  to lock pt's room during meals and group sessions so that she can engage in such  activities. Orders will be placed. Pt denies any issues with bowel movements, denies being in any physical pain. We will continue to monitor.  Total Time spent with patient:  45  minutes  Past Psychiatric History: See H & P  Past Medical History:  Past Medical History:  Diagnosis Date   Bipolar affect, depressed (HCC)    Constipation 08/17/2022   Depression    Falls 07/21/2022   Fracture of femoral neck, right, closed (HCC) 01/17/2022   Herpes simplex 08/22/2017   Open fracture dislocation of right elbow joint 01/17/2022    Past Surgical History:  Procedure Laterality Date   NO PAST SURGERIES     SALPINGECTOMY     Family History: History reviewed. No pertinent family history.  Family Psychiatric  History: See H & P  Social History:  Social History   Substance and Sexual Activity  Alcohol Use Yes     Social History   Substance and Sexual Activity  Drug Use Yes   Types: Cocaine, Marijuana    Social History   Socioeconomic History   Marital status: Single    Spouse name: Not on file   Number of children: Not on file   Years of education: Not on file   Highest education level: Not on file  Occupational History   Not on file  Tobacco Use   Smoking status: Every Day   Smokeless tobacco: Not on file  Substance and Sexual Activity   Alcohol use: Yes   Drug use: Yes    Types: Cocaine, Marijuana   Sexual activity: Yes  Other Topics Concern   Not on file  Social History Narrative   Not on file   Social Determinants of Health   Financial Resource Strain: Low Risk  (09/12/2022)   Received from [facility]   Overall Financial Resource Strain (CARDIA)    Difficulty of Paying Living Expenses: Not hard at all  Food Insecurity: Patient Declined (12/20/2022)   Hunger Vital Sign    Worried About Running Out of Food in the Last Year: Patient declined    Ran Out of Food in the Last Year: Patient declined  Transportation Needs: No Transportation Needs (12/20/2022)   PRAPARE - Administrator, Civil Service (Medical): No    Lack of Transportation (Non-Medical): No  Physical Activity: Not on file  Stress: No Stress  Concern Present (07/17/2022)   Received from [facility] of Occupational Health - Occupational Stress Questionnaire    Feeling of Stress : Not at all  Social Connections: Unknown (07/16/2022)   Received from [facility], [facility]   Social Network    Social Network: Not on file   Sleep: Good  Appetite:  Good  Current Medications: Current Facility-Administered Medications  Medication Dose Route Frequency Provider Last Rate Last Admin   acetaminophen (TYLENOL) tablet 650 mg  650 mg Oral Q6H PRN [name], NP   650 mg at 02/25/23 2100   alum & mag hydroxide-simeth (MAALOX/MYLANTA) 200-200-20 MG/5ML suspension 30 mL  30 mL Oral Q4H PRN [name], NP   30 mL at 01/28/23 1530   busPIRone (BUSPAR) tablet 15 mg  15 mg Oral BID [name], NP   15 mg at 03/04/23 0800   cyanocobalamin (VITAMIN B12) injection 1,000 mcg  1,000 mcg Intramuscular Q30 days Sarita Bottom, MD   1,000  mcg at 03/04/23 1421   diphenhydrAMINE (BENADRYL) capsule 50 mg  50 mg Oral TID PRN [name], NP   50 mg at 01/15/23 1211   Or   diphenhydrAMINE (BENADRYL) injection 50 mg  50 mg Intramuscular TID PRN [name], NP       feeding supplement (ENSURE ENLIVE / ENSURE PLUS) liquid 237 mL  237 mL Oral TID BM Marina Desire, NP   237 mL at 03/04/23 1423   haloperidol (HALDOL) tablet 5 mg  5 mg Oral TID PRN [name], NP   5 mg at 01/15/23 1211   Or   haloperidol lactate (HALDOL) injection 5 mg  5 mg Intramuscular TID PRN [name], NP       hydrOXYzine (ATARAX) tablet 25 mg  25 mg Oral TID PRN [name], DO   25 mg at 03/03/23 2111   LORazepam (ATIVAN) tablet 2 mg  2 mg Oral TID PRN [name], NP   2 mg at 01/15/23 1211   Or   LORazepam (ATIVAN) injection 2 mg  2 mg Intramuscular TID PRN [name], NP       magnesium hydroxide (MILK OF MAGNESIA) suspension 30 mL  30 mL Oral Daily PRN [name], NP   30 mL at 02/26/23 1828   melatonin tablet 5  mg  5 mg Oral QHS Leasia Swann, Tyler Aas, NP   5 mg at 03/03/23 2111   [START ON 03/05/2023] methylphenidate (RITALIN) tablet 5 mg  5 mg Oral Daily Massengill, Nathan, MD       nicotine (NICODERM CQ - dosed in mg/24 hours) patch 21 mg  21 mg Transdermal Daily [name], DO   21 mg at 02/27/23 0830   paliperidone (INVEGA) 24 hr tablet 6 mg  6 mg Oral Daily [name], NP   6 mg at 03/03/23 2111   pantoprazole (PROTONIX) EC tablet 40 mg  40 mg Oral Daily [name], NP   40 mg at 03/04/23 0800   polyethylene glycol (MIRALAX / GLYCOLAX) packet 17 g  17 g Oral Daily PRN [name], NP   17 g at 03/01/23 1352   sertraline (ZOLOFT) tablet 150 mg  150 mg Oral Daily Massengill, Harrold Donath, MD   150 mg at 03/04/23 0800   traZODone (DESYREL) tablet 50 mg  50 mg Oral QHS [name], NP   50 mg at 03/03/23 2111   Vitamin D (Ergocalciferol) (DRISDOL) 1.25 MG (50000 UNIT) capsule 50,000 Units  50,000 Units Oral Q7 days [name], NP   50,000 Units at 03/02/23 1607   Lab Results: No results found for this or any previous visit (from the past 48 hour(s)).  Blood Alcohol level:  Lab Results  Component Value Date   ETH <10 12/19/2022   ETH <10 11/21/2019   Metabolic Disorder Labs: Lab Results  Component Value Date   HGBA1C 4.4 (L) 12/21/2022   MPG 79.58 12/21/2022   No results found for: "PROLACTIN" Lab Results  Component Value Date   CHOL 218 (H) 12/21/2022   TRIG 81 12/21/2022   HDL 53 12/21/2022   CHOLHDL 4.1 12/21/2022   VLDL 16 12/21/2022   LDLCALC 149 (H) 12/21/2022   LDLCALC 110 (H) 03/13/2011   Physical Findings: AIMS: Facial and Oral Movements Muscles of Facial Expression: None, normal Lips and Perioral Area: None, normal Jaw: None, normal Tongue: None, normal,Extremity Movements Upper (arms, wrists, hands, fingers): None, normal Lower (legs, knees, ankles, toes): None, normal, Trunk Movements Neck, shoulders, hips:  None, normal, Overall Severity Severity of abnormal  movements (highest score from questions above): None, normal Incapacitation due to abnormal movements: None, normal Patient's awareness of abnormal movements (rate only patient's report): No Awareness, Dental Status Current problems with teeth and/or dentures?: No Does patient usually wear dentures?: No  CIWA:    COWS:    AIMS:0 Musculoskeletal: Strength & Muscle Tone: within normal limits Gait & Station: normal Patient leans: N/A  Psychiatric Specialty Exam:  Presentation  General Appearance:  Appropriate for Environment; Disheveled  Eye Contact: Fair  Speech: Garbled  Speech Volume: Decreased  Handedness: Right  Mood and Affect  Mood: Depressed; Anxious  Affect: Congruent  Thought Process  Thought Processes: Coherent  Descriptions of Associations:Intact  Orientation:Partial  Thought Content:Logical  History of Schizophrenia/Schizoaffective disorder:Yes  Duration of Psychotic Symptoms:Greater than six months  Hallucinations:Hallucinations: None  Ideas of Reference:None  Suicidal Thoughts:Suicidal Thoughts: No  Homicidal Thoughts:Homicidal Thoughts: No  Sensorium  Memory: Immediate Good  Judgment: Poor  Insight: Poor  Executive Functions  Concentration: Poor  Attention Span: Poor  Recall: Poor  Fund of Knowledge: Poor  Language: Fair  Psychomotor Activity  Psychomotor Activity: Psychomotor Activity: Normal   Assets  Assets: Resilience  Sleep  Sleep: Sleep: Fair  Physical Exam: Physical Exam Vitals and nursing note reviewed.  Constitutional:      Appearance: Normal appearance.  HENT:     Nose: Nose normal.  Eyes:     Pupils: Pupils are equal, round, and reactive to light.  Pulmonary:     Effort: Pulmonary effort is normal.  Genitourinary:    Comments: Deferred Musculoskeletal:        General: Normal range of motion.     Cervical back: Normal range of motion.  Neurological:     Mental Status: She is  alert and oriented to person, place, and time.    Review of Systems  Constitutional:  Negative for fever.  HENT:  Negative for hearing loss.   Eyes:  Negative for blurred vision.  Respiratory:  Negative for cough.   Cardiovascular:  Negative for chest pain.  Gastrointestinal:  Negative for heartburn.  Genitourinary:  Negative for dysuria.  Musculoskeletal:  Negative for myalgias.  Skin:  Negative for rash.  Neurological:  Negative for dizziness.  Psychiatric/Behavioral:  Positive for depression. Negative for hallucinations, memory loss, substance abuse and suicidal ideas. The patient is nervous/anxious and has insomnia.    Blood pressure 109/72, pulse 90, temperature 97.6 F (36.4 C), resp. rate 16, height 4\' 11"  (1.499 m), weight 63 kg, SpO2 99%. Body mass index is 28.05 kg/m.  Treatment Plan Summary: Daily contact with patient to assess and evaluate symptoms and progress in treatment and Medication management.    Still waiting on safe disposition as patient would not be able to live independently given cognitive impairment. Recommend assisted living.    No medication side effects.  Because of urinary incontinence, recommended adult diapers to be worn.   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  Plan:  -Start Ritalin 5 mg daily on 8/27 to help with energy & wakefulness -Continue Vitamin D 50.000 units weekly for bone health. -Continue Sertraline150 mg po Q daily for depression/anxiety -Continue Buspar 15 mg po bid for anxiety.  -Continue paliperidone 6 mg po qd for mood stabilization -Continue Trazodone 50  mg nightly for insomnia  -Continue Nicoderm 21 mg topically Q 24 hrs for nicotine withdrawal  management. -Continue vitamin B12 1000 mcg IM weekly for 4 weeks then monthly -Continue Melatonin 5 mg nightly for sleep -Continue Protonix EC 40 mg p.o. daily for GERD -Continue MiraLAX 17 g p.o. PRN for constipation -Continue hydroxyzine 25 mg p.o. 3 times daily as needed for anxiety -Continue Ensure nutritional shakes TID in between meals  -Previously discontinued Hydroxyzine 50 mg nightly-hypotension in the mornings   Safety and Monitoring: Voluntary admission to inpatient psychiatric unit for safety, stabilization and treatment Daily contact with patient to assess and evaluate symptoms and progress in treatment Patient's case to be discussed in multi-disciplinary team meeting Observation Level : q15 minute checks Vital signs: q12 hours Precautions: Safety   Discharge Planning: Social work and case management to assist with discharge planning and identification of hospital follow-up needs prior to discharge Estimated LOS: Unknown at this time. Discharge Concerns: Need to establish a safety plan; Medication compliance and effectiveness Discharge Goals: Return home with outpatient referrals for mental health follow-up including medication management/psychotherapy No legal guardian, has payee  [name], NP, pmhnp 03/04/2023, 4:57 PM Patient ID: Larey Brick, female   DOB: [birth year], 47 y.o.   MRN: [id number]

## 2023-03-04 NOTE — Progress Notes (Signed)
   03/04/23 0933  Psych Admission Type (Psych Patients Only)  Admission Status Involuntary  Psychosocial Assessment  Patient Complaints Depression  Eye Contact Fair  Facial Expression Flat  Affect Appropriate to circumstance  Speech Logical/coherent  Interaction Childlike;Minimal  Motor Activity Rigidity  Appearance/Hygiene Unremarkable  Behavior Characteristics Appropriate to situation  Mood Sad  Thought Process  Coherency WDL  Content WDL  Delusions None reported or observed  Perception WDL  Hallucination None reported or observed  Judgment Limited  Confusion None  Danger to Self  Current suicidal ideation? Denies  Self-Injurious Behavior No self-injurious ideation or behavior indicators observed or expressed   Agreement Not to Harm Self Yes  Description of Agreement verbal  Danger to Others  Danger to Others None reported or observed

## 2023-03-04 NOTE — Progress Notes (Signed)
Psychoeducational Group Note  Date:  03/04/2023 Time:  2244  Group Topic/Focus:  Relapse Prevention Planning:   The focus of this group is to define relapse and discuss the need for planning to combat relapse.  Participation Level: Did Not Attend  Participation Quality:  Not Applicable  Affect:  Not Applicable  Cognitive:  Not Applicable  Insight:  Not Applicable  Engagement in Group: Not Applicable  Additional Comments:  The patient did not attend group this evening.   Hazle Coca S 03/04/2023, 10:44 PM

## 2023-03-04 NOTE — Group Note (Signed)
Occupational Therapy Group Note  Group Topic: Sleep Hygiene  Group Date: 03/04/2023 Start Time: 1430 End Time: 1505 Facilitators: Ted Mcalpine, OT   Group Description: Group encouraged increased participation and engagement through topic focused on sleep hygiene. Patients reflected on the quality of sleep they typically receive and identified areas that need improvement. Group was given background information on sleep and sleep hygiene, including common sleep disorders. Group members also received information on how to improve one's sleep and introduced a sleep diary as a tool that can be utilized to track sleep quality over a length of time. Group session ended with patients identifying one or more strategies they could utilize or implement into their sleep routine in order to improve overall sleep quality.        Therapeutic Goal(s):  Identify one or more strategies to improve overall sleep hygiene  Identify one or more areas of sleep that are negatively impacted (sleep too much, too little, etc)     Participation Level: Engaged   Participation Quality: Independent   Behavior: Appropriate   Speech/Thought Process: Loose association    Affect/Mood: Appropriate   Insight: Fair   Judgement: Fair      Modes of Intervention: Education  Patient Response to Interventions:  Attentive   Plan: Continue to engage patient in OT groups 2 - 3x/week.  03/04/2023  Ted Mcalpine, OT  Kerrin Champagne, OT

## 2023-03-04 NOTE — BH IP Treatment Plan (Signed)
Interdisciplinary Treatment and Diagnostic Plan Update  03/04/2023 Time of Session: 10:45am (UPDATE) Yolanda [name] MRN: [id number]  Principal Diagnosis: Schizoaffective disorder, depressive type (HCC)  Secondary Diagnoses: Principal Problem:   Schizoaffective disorder, depressive type (HCC) Active Problems:   GERD (gastroesophageal reflux disease)   Intellectual disability   Tobacco use disorder   Current Medications:  Current Facility-Administered Medications  Medication Dose Route Frequency Provider Last Rate Last Admin   acetaminophen (TYLENOL) tablet 650 mg  650 mg Oral Q6H PRN [name], NP   650 mg at 02/25/23 2100   alum & mag hydroxide-simeth (MAALOX/MYLANTA) 200-200-20 MG/5ML suspension 30 mL  30 mL Oral Q4H PRN [name], NP   30 mL at 01/28/23 1530   busPIRone (BUSPAR) tablet 15 mg  15 mg Oral BID [name], NP   15 mg at 03/04/23 0800   cyanocobalamin (VITAMIN B12) injection 1,000 mcg  1,000 mcg Intramuscular Q30 days Sarita Bottom, MD   1,000 mcg at 02/02/23 1238   diphenhydrAMINE (BENADRYL) capsule 50 mg  50 mg Oral TID PRN [name], NP   50 mg at 01/15/23 1211   Or   diphenhydrAMINE (BENADRYL) injection 50 mg  50 mg Intramuscular TID PRN [name], NP       feeding supplement (ENSURE ENLIVE / ENSURE PLUS) liquid 237 mL  237 mL Oral TID BM Nkwenti, Doris, NP   237 mL at 03/04/23 1040   haloperidol (HALDOL) tablet 5 mg  5 mg Oral TID PRN [name], NP   5 mg at 01/15/23 1211   Or   haloperidol lactate (HALDOL) injection 5 mg  5 mg Intramuscular TID PRN [name], NP       hydrOXYzine (ATARAX) tablet 25 mg  25 mg Oral TID PRN [name], DO   25 mg at 03/03/23 2111   LORazepam (ATIVAN) tablet 2 mg  2 mg Oral TID PRN [name], NP   2 mg at 01/15/23 1211   Or   LORazepam (ATIVAN) injection 2 mg  2 mg Intramuscular TID PRN [name], NP       magnesium hydroxide (MILK OF MAGNESIA) suspension 30 mL  30 mL Oral Daily PRN  [name], NP   30 mL at 02/26/23 1828   melatonin tablet 5 mg  5 mg Oral QHS Nkwenti, Doris, NP   5 mg at 03/03/23 2111   [START ON 03/05/2023] methylphenidate (RITALIN) tablet 5 mg  5 mg Oral Daily Massengill, Nathan, MD       nicotine (NICODERM CQ - dosed in mg/24 hours) patch 21 mg  21 mg Transdermal Daily [name], DO   21 mg at 02/27/23 0830   paliperidone (INVEGA) 24 hr tablet 6 mg  6 mg Oral Daily [name], NP   6 mg at 03/03/23 2111   pantoprazole (PROTONIX) EC tablet 40 mg  40 mg Oral Daily Nwoko, Nicole Kindred I, NP   40 mg at 03/04/23 0800   polyethylene glycol (MIRALAX / GLYCOLAX) packet 17 g  17 g Oral Daily PRN [name], NP   17 g at 03/01/23 1352   sertraline (ZOLOFT) tablet 150 mg  150 mg Oral Daily Massengill, Harrold Donath, MD   150 mg at 03/04/23 0800   traZODone (DESYREL) tablet 50 mg  50 mg Oral QHS [name], NP   50 mg at 03/03/23 2111   Vitamin D (Ergocalciferol) (DRISDOL) 1.25 MG (50000 UNIT) capsule 50,000 Units  50,000 Units Oral Q7 days [name],  NP   50,000 Units at 03/02/23 1607   PTA Medications: Medications Prior to Admission  Medication Sig Dispense Refill Last Dose   busPIRone (BUSPAR) 15 MG tablet Take 15 mg by mouth 2 (two) times daily. (Patient not taking: Reported on 12/19/2022)      paliperidone (INVEGA SUSTENNA) 156 MG/ML SUSY injection Inject 156 mg into the muscle once. (Patient not taking: Reported on 12/19/2022)      sertraline (ZOLOFT) 50 MG tablet Take 150 mg by mouth daily. (Patient not taking: Reported on 12/19/2022)      traZODone (DESYREL) 100 MG tablet Take 100 mg by mouth at bedtime as needed for sleep. (Patient not taking: Reported on 11/12/2022)       Patient Stressors: Medication change or noncompliance    Patient Strengths: Forensic psychologist fund of knowledge   Treatment Modalities: Medication Management, Group therapy, Case management,  1 to 1 session with clinician, Psychoeducation, Recreational  therapy.   Physician Treatment Plan for Primary Diagnosis: Schizoaffective disorder, depressive type (HCC) Long Term Goal(s): Improvement in symptoms so as ready for discharge   Short Term Goals: Ability to identify and develop effective coping behaviors will improve Ability to maintain clinical measurements within normal limits will improve Compliance with prescribed medications will improve Ability to identify triggers associated with substance abuse/mental health issues will improve Ability to identify changes in lifestyle to reduce recurrence of condition will improve Ability to verbalize feelings will improve Ability to disclose and discuss suicidal ideas Ability to demonstrate self-control will improve  Medication Management: Evaluate patient's response, side effects, and tolerance of medication regimen.  Therapeutic Interventions: 1 to 1 sessions, Unit Group sessions and Medication administration.  Evaluation of Outcomes: Progressing  Physician Treatment Plan for Secondary Diagnosis: Principal Problem:   Schizoaffective disorder, depressive type (HCC) Active Problems:   GERD (gastroesophageal reflux disease)   Intellectual disability   Tobacco use disorder  Long Term Goal(s): Improvement in symptoms so as ready for discharge   Short Term Goals: Ability to identify and develop effective coping behaviors will improve Ability to maintain clinical measurements within normal limits will improve Compliance with prescribed medications will improve Ability to identify triggers associated with substance abuse/mental health issues will improve Ability to identify changes in lifestyle to reduce recurrence of condition will improve Ability to verbalize feelings will improve Ability to disclose and discuss suicidal ideas Ability to demonstrate self-control will improve     Medication Management: Evaluate patient's response, side effects, and tolerance of medication  regimen.  Therapeutic Interventions: 1 to 1 sessions, Unit Group sessions and Medication administration.  Evaluation of Outcomes: Progressing   RN Treatment Plan for Primary Diagnosis: Schizoaffective disorder, depressive type (HCC) Long Term Goal(s): Knowledge of disease and therapeutic regimen to maintain health will improve  Short Term Goals: Ability to remain free from injury will improve, Ability to verbalize frustration and anger appropriately will improve, Ability to participate in decision making will improve, Ability to verbalize feelings will improve, Ability to identify and develop effective coping behaviors will improve, and Compliance with prescribed medications will improve  Medication Management: RN will administer medications as ordered by provider, will assess and evaluate patient's response and provide education to patient for prescribed medication. RN will report any adverse and/or side effects to prescribing provider.  Therapeutic Interventions: 1 on 1 counseling sessions, Psychoeducation, Medication administration, Evaluate responses to treatment, Monitor vital signs and CBGs as ordered, Perform/monitor CIWA, COWS, AIMS and Fall Risk screenings as ordered, Perform wound care treatments  as ordered.  Evaluation of Outcomes: Progressing   LCSW Treatment Plan for Primary Diagnosis: Schizoaffective disorder, depressive type (HCC) Long Term Goal(s): Safe transition to appropriate next level of care at discharge, Engage patient in therapeutic group addressing interpersonal concerns.  Short Term Goals: Engage patient in aftercare planning with referrals and resources, Increase social support, Increase emotional regulation, Facilitate acceptance of mental health diagnosis and concerns, Identify triggers associated with mental health/substance abuse issues, and Increase skills for wellness and recovery  Therapeutic Interventions: Assess for all discharge needs, 1 to 1 time with  Social worker, Explore available resources and support systems, Assess for adequacy in community support network, Educate family and significant other(s) on suicide prevention, Complete Psychosocial Assessment, Interpersonal group therapy.  Evaluation of Outcomes: Progressing   Progress in Treatment: Attending groups: Yes. Participating in groups: Yes. Taking medication as prescribed: Yes. Toleration medication: Yes. Family/Significant other contact made: Yes, individual(s) contacted:  CSW has been in contact with DSS  Patient understands diagnosis: No. Discussing patient identified problems/goals with staff: Yes. Medical problems stabilized or resolved: Yes. Denies suicidal/homicidal ideation: Yes. Issues/concerns per patient self-inventory: No.   New problem(s) identified: No, Describe:  none reported    New Short Term/Long Term Goal(s): medication stabilization, elimination of SI thoughts, development of comprehensive mental wellness plan.    Patient Goals:  coping skills   Discharge Plan or Barriers: Patient recently admitted. CSW will continue to follow and assess for appropriate referrals and possible discharge planning.      Reason for Continuation of Hospitalization: Medication stabilization   Estimated Length of Stay: unknown  Last 3 [place] Suicide Severity Risk Score: Flowsheet Row Ad[facility] (Current) [facility] from 12/19/2022 in [facility] Emergency Department [facility] ED from 11/12/2022 in [facility] Emergency Department at [facility]  C-SSRS RISK CATEGORY High Risk High Risk Moderate Risk       Last [facility] 2/9 Scores:     No data to display          Scribe for Treatment Team: Izell [place], LCSW 03/04/2023 2:08 PM

## 2023-03-04 NOTE — [facility] Counselor (Signed)
CSW received an email concerning the disposition of pt's placement with [facility]. CSW spoke with [facility] Care Manager, Corey Skains (Cell) [phone] and will follow-up to schedule a meeting about potential placement.

## 2023-03-04 NOTE — [facility] Group Notes (Signed)
Spiritual care group on grief and loss facilitated by Chaplain Katy Claussen, [facility]  Group Goal: Support / Education around grief and loss  Members engage in facilitated group support and psycho-social education.  Group Description:  Following introductions and group rules, group members engaged in facilitated group dialogue and support around topic of loss, with particular support around experiences of loss in their lives. Group Identified types of loss (relationships / self / things) and identified patterns, circumstances, and changes that precipitate losses. Reflected on thoughts / feelings around loss, normalized grief responses, and recognized variety in grief experience. Group encouraged individual reflection on safe space and on the coping skills that they are already utilizing.  Group drew on Adlerian / Rogerian and narrative framework  Patient Progress: Did not attend.  

## 2023-03-04 NOTE — Plan of Care (Signed)
  Problem: Activity: Goal: Imbalance in normal sleep/wake cycle will improve Outcome: Progressing   Problem: Coping: Goal: Coping ability will improve Outcome: Progressing   

## 2023-03-05 DIAGNOSIS — F251 Schizoaffective disorder, depressive type: Secondary | ICD-10-CM | POA: Diagnosis not present

## 2023-03-05 NOTE — Group Note (Signed)
Recreation Therapy Group Note   Group Topic:Animal Assisted Therapy   Group Date: 03/05/2023 Start Time: 1000 End Time: 1030 Facilitators: Rubyann Lingle-McCall, LRT,CTRS Location: [address] Dayroom   Animal-Assisted Activity (AAA) Program Checklist/Progress Notes Patient Eligibility Criteria Checklist & Daily Group note for Rec Tx Intervention  AAA/T Program Assumption of Risk Form signed by Patient/ or Parent Legal Guardian Yes  Patient understands his/her participation is voluntary Yes   Affect/Mood: N/A   Participation Level: Did not attend    Clinical Observations/Individualized Feedback:     Plan: Continue to engage patient in RT group sessions 2-3x/week.   Yolanda [name], LRT,CTRS  03/05/2023 12:35 PM

## 2023-03-05 NOTE — Progress Notes (Signed)
   03/05/23 2115  Psych Admission Type (Psych Patients Only)  Admission Status Involuntary  Psychosocial Assessment  Patient Complaints Anxiety;Depression;Hopelessness  Eye Contact Fair  Facial Expression Flat;Sad  Affect Appropriate to circumstance  Speech Logical/coherent  Interaction Childlike;Minimal  Motor Activity Fidgety  Appearance/Hygiene Unremarkable  Behavior Characteristics Appropriate to situation  Mood Depressed;Sad;Preoccupied  Thought Process  Coherency WDL  Content WDL  Delusions None reported or observed  Perception WDL  Hallucination None reported or observed  Judgment Poor  Confusion None  Danger to Self  Current suicidal ideation? Denies  Self-Injurious Behavior No self-injurious ideation or behavior indicators observed or expressed   Agreement Not to Harm Self Yes  Description of Agreement Verbal  Danger to Others  Danger to Others None reported or observed

## 2023-03-05 NOTE — Group Note (Signed)
Date:  03/05/2023 Time:  10:47 AM  Group Topic/Focus:  Goals Group:   The focus of this group is to help patients establish daily goals to achieve during treatment and discuss how the patient can incorporate goal setting into their daily lives to aide in recovery.    Participation Level:  Did Not Attend  Participation Quality:      Affect:      Cognitive:      Insight: None  Engagement in Group:      Modes of Intervention:      Additional Comments:  patient did not attend the group today.  [name] 03/05/2023, 10:47 AM

## 2023-03-05 NOTE — Progress Notes (Signed)
[facility] MD Progress Note  03/05/2023 2:18 PM Yolanda [name]  MRN:  [id number] Principal Problem: Schizoaffective disorder, depressive type (HCC) Diagnosis: Principal Problem:   Schizoaffective disorder, depressive type (HCC) Active Problems:   GERD (gastroesophageal reflux disease)   Intellectual disability   Tobacco use disorder  Reason for admission: This is the first psychiatric admission in this [facility] in 12 years for this 47 AA female with an extensive hx of mental illnesses & probable polysubstance use disorders. She is admitted to the [facility] from the [facility] hospital with complain of worsening suicidal ideations with plan to stab herself. Per chart review, patient apparently reported at the ED that she has been depressed for a while & has not been taking her mental health medications. After medical evaluation.clearance, she was transferred to the [facility] for further psychiatric evaluation/treatments.   24 hr chart review: Sleep Hours last night: 8.5 hrs as per nursing documentation. Nursing Concerns: Isolative in room & not attending unit group sessions Behavioral episodes in the past 24 OZH:YQMV reported or documented Medication Compliance: Compliant  Vital Signs in the past 24 hrs: WNL, but on lower side of normal with SBP in the 90s. Fluids encouraged PRN Medications in the past 24 hrs: Hydroxyzine  Patient assessment note:  On assessment today, the pt reports that their mood is depressed & "low". Reports that anxiety is moderate. Sleep is "okay". Appetite is "okay". Concentration is normal.  Energy level is low. Denies suicidal thoughts. Denies suicidal intent and plan.  Denies having any HI.  Denies having psychotic symptoms.  Denies having side effects to current psychiatric medications.  We discussed changes to current medication regimen, including the starting of the Ritalin 5 mg daily for help with energy & motivation, to which patient was receptive. Initially educated on this  yesterday, and education reiterated today. Benefits, rationales and possible side effects of medication explained.  Discussed the following psychosocial stressors: Continuous hospitalization being addressed with CSW.    We continue to await DSS to coordinate with pt's treatment team for placement in a living arrangement outside of this hospital that would be beneficial for her.  It is preferable for pt to be assigned a legal guardian by DSS. CSW is in discussions with DSS regarding this.  No TD/EPS type symptoms found on assessment, and pt denies any feelings of stiffness. AIMS: 0.  Continuing all meds as below with no changes today, and continuing to monitor. Pt is being given positive reinforcements to get out of her room for unit group activities and to tend to personal hygiene needs.   Total Time spent with patient:  45 minutes  Past Psychiatric History: See H & P  Past Medical History:  Past Medical History:  Diagnosis Date   Bipolar affect, depressed (HCC)    Constipation 08/17/2022   Depression    Falls 07/21/2022   Fracture of femoral neck, right, closed (HCC) 01/17/2022   Herpes simplex 08/22/2017   Open fracture dislocation of right elbow joint 01/17/2022    Past Surgical History:  Procedure Laterality Date   NO PAST SURGERIES     SALPINGECTOMY     Family History: History reviewed. No pertinent family history.  Family Psychiatric  History: See H & P  Social History:  Social History   Substance and Sexual Activity  Alcohol Use Yes     Social History   Substance and Sexual Activity  Drug Use Yes   Types: Cocaine, Marijuana    Social History  Socioeconomic History   Marital status: Single    Spouse name: Not on file   Number of children: Not on file   Years of education: Not on file   Highest education level: Not on file  Occupational History   Not on file  Tobacco Use   Smoking status: Every Day   Smokeless tobacco: Not on file  Substance and Sexual  Activity   Alcohol use: Yes   Drug use: Yes    Types: Cocaine, Marijuana   Sexual activity: Yes  Other Topics Concern   Not on file  Social History Narrative   Not on file   Social Determinants of Health   Financial Resource Strain: Low Risk  (09/12/2022)   Received from [facility], [facility]   Overall Financial Resource Strain (CARDIA)    Difficulty of Paying Living Expenses: Not hard at all  Food Insecurity: Patient Declined (12/20/2022)   Hunger Vital Sign    Worried About Running Out of Food in the Last Year: Patient declined    Ran Out of Food in the Last Year: Patient declined  Transportation Needs: No Transportation Needs (12/20/2022)   PRAPARE - Administrator, Civil Service (Medical): No    Lack of Transportation (Non-Medical): No  Physical Activity: Not on file  Stress: No Stress Concern Present (07/17/2022)   Received from [facility], [facility] of Occupational Health - Occupational Stress Questionnaire    Feeling of Stress : Not at all  Social Connections: Unknown (07/16/2022)   Received from [facility], [facility]   Social Network    Social Network: Not on file   Sleep: Good  Appetite:  Good  Current Medications: Current Facility-Administered Medications  Medication Dose Route Frequency Provider Last Rate Last Admin   acetaminophen (TYLENOL) tablet 650 mg  650 mg Oral Q6H PRN [name], NP   650 mg at 02/25/23 2100   alum & mag hydroxide-simeth (MAALOX/MYLANTA) 200-200-20 MG/5ML suspension 30 mL  30 mL Oral Q4H PRN [name], NP   30 mL at 01/28/23 1530   busPIRone (BUSPAR) tablet 15 mg  15 mg Oral BID [name], NP   15 mg at 03/05/23 3664   cyanocobalamin (VITAMIN B12) injection 1,000 mcg  1,000 mcg Intramuscular Q30 days Sarita Bottom, MD   1,000 mcg at 03/04/23 1421   diphenhydrAMINE (BENADRYL) capsule 50 mg  50 mg Oral TID PRN [name], NP   50 mg at 01/15/23 1211   Or   diphenhydrAMINE  (BENADRYL) injection 50 mg  50 mg Intramuscular TID PRN [name], NP       feeding supplement (ENSURE ENLIVE / ENSURE PLUS) liquid 237 mL  237 mL Oral TID BM Dredyn Gubbels, NP   237 mL at 03/05/23 1054   haloperidol (HALDOL) tablet 5 mg  5 mg Oral TID PRN [name], NP   5 mg at 01/15/23 1211   Or   haloperidol lactate (HALDOL) injection 5 mg  5 mg Intramuscular TID PRN [name], NP       hydrOXYzine (ATARAX) tablet 25 mg  25 mg Oral TID PRN [name], DO   25 mg at 03/04/23 2123   LORazepam (ATIVAN) tablet 2 mg  2 mg Oral TID PRN [name], NP   2 mg at 01/15/23 1211   Or   LORazepam (ATIVAN) injection 2 mg  2 mg Intramuscular TID PRN [name], NP  magnesium hydroxide (MILK OF MAGNESIA) suspension 30 mL  30 mL Oral Daily PRN [name], NP   30 mL at 02/26/23 1828   melatonin tablet 5 mg  5 mg Oral QHS Shiquita Collignon, NP   5 mg at 03/04/23 2123   methylphenidate (RITALIN) tablet 5 mg  5 mg Oral Daily Massengill, Nathan, MD   5 mg at 03/05/23 0981   nicotine (NICODERM CQ - dosed in mg/24 hours) patch 21 mg  21 mg Transdermal Daily [name], DO   21 mg at 03/05/23 1914   paliperidone (INVEGA) 24 hr tablet 6 mg  6 mg Oral Daily [name], NP   6 mg at 03/04/23 2123   pantoprazole (PROTONIX) EC tablet 40 mg  40 mg Oral Daily [name], NP   40 mg at 03/05/23 7829   polyethylene glycol (MIRALAX / GLYCOLAX) packet 17 g  17 g Oral Daily PRN [name], NP   17 g at 03/01/23 1352   sertraline (ZOLOFT) tablet 150 mg  150 mg Oral Daily Massengill, Harrold Donath, MD   150 mg at 03/05/23 5621   traZODone (DESYREL) tablet 50 mg  50 mg Oral QHS [name], NP   50 mg at 03/04/23 2123   Vitamin D (Ergocalciferol) (DRISDOL) 1.25 MG (50000 UNIT) capsule 50,000 Units  50,000 Units Oral Q7 days [name], NP   50,000 Units at 03/02/23 1607   Lab Results: No results found for this or any previous visit (from the past 48 hour(s)).  Blood Alcohol level:   Lab Results  Component Value Date   ETH <10 12/19/2022   ETH <10 11/21/2019   Metabolic Disorder Labs: Lab Results  Component Value Date   HGBA1C 4.4 (L) 12/21/2022   MPG 79.58 12/21/2022   No results found for: "PROLACTIN" Lab Results  Component Value Date   CHOL 218 (H) 12/21/2022   TRIG 81 12/21/2022   HDL 53 12/21/2022   CHOLHDL 4.1 12/21/2022   VLDL 16 12/21/2022   LDLCALC 149 (H) 12/21/2022   LDLCALC 110 (H) 03/13/2011   Physical Findings: AIMS: Facial and Oral Movements Muscles of Facial Expression: None, normal Lips and Perioral Area: None, normal Jaw: None, normal Tongue: None, normal,Extremity Movements Upper (arms, wrists, hands, fingers): None, normal Lower (legs, knees, ankles, toes): None, normal, Trunk Movements Neck, shoulders, hips: None, normal, Overall Severity Severity of abnormal movements (highest score from questions above): None, normal Incapacitation due to abnormal movements: None, normal Patient's awareness of abnormal movements (rate only patient's report): No Awareness, Dental Status Current problems with teeth and/or dentures?: No Does patient usually wear dentures?: No  CIWA:    COWS:    AIMS:0 Musculoskeletal: Strength & Muscle Tone: within normal limits Gait & Station: normal Patient leans: N/A  Psychiatric Specialty Exam:  Presentation  General Appearance:  Appropriate for Environment; Casual  Eye Contact: Fair  Speech: Clear and Coherent  Speech Volume: Normal  Handedness: Right  Mood and Affect  Mood: Depressed  Affect: Congruent  Thought Process  Thought Processes: Coherent  Descriptions of Associations:Intact  Orientation:Partial  Thought Content:Logical  History of Schizophrenia/Schizoaffective disorder:Yes  Duration of Psychotic Symptoms:Greater than six months  Hallucinations:Hallucinations: None  Ideas of Reference:None  Suicidal Thoughts:Suicidal Thoughts: No  Homicidal  Thoughts:Homicidal Thoughts: No  Sensorium  Memory: Immediate Good  Judgment: Poor  Insight: Poor  Executive Functions  Concentration: Poor  Attention Span: Poor  Recall: Poor  Fund of Knowledge: Poor  Language: Fair  Lexicographer  Activity: Psychomotor Activity: Normal   Assets  Assets: Resilience  Sleep  Sleep: Sleep: Good  Physical Exam: Physical Exam Vitals and nursing note reviewed.  Constitutional:      Appearance: Normal appearance.  HENT:     Nose: Nose normal.  Eyes:     Pupils: Pupils are equal, round, and reactive to light.  Pulmonary:     Effort: Pulmonary effort is normal.  Genitourinary:    Comments: Deferred Musculoskeletal:        General: Normal range of motion.     Cervical back: Normal range of motion.  Neurological:     Mental Status: She is alert and oriented to person, place, and time.    Review of Systems  Constitutional:  Negative for fever.  HENT:  Negative for hearing loss.   Eyes:  Negative for blurred vision.  Respiratory:  Negative for cough.   Cardiovascular:  Negative for chest pain.  Gastrointestinal:  Negative for heartburn.  Genitourinary:  Negative for dysuria.  Musculoskeletal:  Negative for myalgias.  Skin:  Negative for rash.  Neurological:  Negative for dizziness.  Psychiatric/Behavioral:  Positive for depression. Negative for hallucinations, memory loss, substance abuse and suicidal ideas. The patient is nervous/anxious and has insomnia.    Blood pressure 96/67, pulse 62, temperature 97.6 F (36.4 C), resp. rate 16, height 4\' 11"  (1.499 m), weight 63 kg, SpO2 95%. Body mass index is 28.05 kg/m.  Treatment Plan Summary: Daily contact with patient to assess and evaluate symptoms and progress in treatment and Medication management.    Still waiting on safe disposition as patient would not be able to live independently given cognitive impairment. Recommend assisted living.    No  medication side effects.  Because of urinary incontinence, recommended adult diapers to be worn.   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  Plan:  -Continue Ritalin 5 mg daily to help with energy & wakefulness -Continue Vitamin D 50.000 units weekly for bone health. -Continue Sertraline150 mg po Q daily for depression/anxiety -Continue Buspar 15 mg po bid for anxiety.  -Continue paliperidone 6 mg po qd for mood stabilization -Continue Trazodone 50 mg nightly for insomnia  -Continue Nicoderm 21 mg topically Q 24 hrs for nicotine withdrawal management. -Continue vitamin B12 1000 mcg IM weekly for 4 weeks then monthly -Continue Melatonin 5 mg nightly for sleep -Continue Protonix EC 40 mg p.o. daily for GERD -Continue MiraLAX 17 g p.o. PRN for constipation -Continue hydroxyzine 25 mg p.o. 3 times daily as needed for anxiety -Continue Ensure nutritional shakes TID in between meals  -Previously discontinued Hydroxyzine 50 mg nightly-hypotension in the mornings   Safety and Monitoring: Voluntary admission to inpatient psychiatric unit for safety, stabilization and treatment Daily contact with patient to assess and evaluate symptoms and progress in treatment Patient's case to be discussed in multi-disciplinary team meeting Observation Level : q15 minute checks Vital signs: q12 hours Precautions: Safety   Discharge Planning: Social work and case management to assist with discharge planning and identification of hospital follow-up needs prior to discharge Estimated LOS: Unknown at this time. Discharge Concerns: Need to establish a safety plan; Medication compliance and effectiveness Discharge Goals: Return home with outpatient  referrals for mental health follow-up including medication management/psychotherapy No legal guardian, has payee  [name], NP,  pmhnp 03/05/2023, 2:18 PM Patient ID: Yolanda [name], female   DOB: Jun 04, 1976, 47 y.o.   MRN: [id number]

## 2023-03-05 NOTE — Group Note (Signed)
[facility] LCSW Group Therapy Note   Group Date: 03/05/2023 Start Time: 1100 End Time: 1145   Type of Therapy/Topic:  Group Therapy:  Emotion Regulation  Participation Level:  Did Not Attend     Description of Group:    The purpose of this group is to assist patients in learning to regulate negative emotions and experience positive emotions. Patients will be guided to discuss ways in which they have been vulnerable to their negative emotions. These vulnerabilities will be juxtaposed with experiences of positive emotions or situations, and patients challenged to use positive emotions to combat negative ones. Special emphasis will be placed on coping with negative emotions in conflict situations, and patients will process healthy conflict resolution skills.  Therapeutic Goals: Patient will identify two positive emotions or experiences to reflect on in order to balance out negative emotions:  Patient will label two or more emotions that they find the most difficult to experience:  Patient will be able to demonstrate positive conflict resolution skills through discussion or role plays:         Therapeutic Modalities:   Cognitive Behavioral Therapy Feelings Identification Dialectical Behavioral Therapy   Yolanda [name] S Zakari Bathe, LCSW

## 2023-03-05 NOTE — Progress Notes (Signed)
Chaplain met with Flor to provide ongoing support for a long hospitalization.  She was drowsy today and wasn't up to talking.  Chaplain will follow up at a different time.    [address], Bcc Pager, [phone]

## 2023-03-05 NOTE — Plan of Care (Signed)
  Problem: Education: Goal: Knowledge of the prescribed therapeutic regimen will improve Outcome: Progressing   Problem: Coping: Goal: Coping ability will improve Outcome: Progressing   Problem: Coping: Goal: Will verbalize feelings Outcome: Progressing   Problem: Health Behavior/Discharge Planning: Goal: Ability to make decisions will improve Outcome: Progressing   Problem: Safety: Goal: Ability to disclose and discuss suicidal ideas will improve Outcome: Progressing   Problem: Self-Concept: Goal: Level of anxiety will decrease Outcome: Progressing   Problem: Safety: Goal: Ability to disclose and discuss suicidal ideas will improve Outcome: Progressing   Problem: Self-Concept: Goal: Level of anxiety will decrease Outcome: Progressing

## 2023-03-05 NOTE — Plan of Care (Signed)
  Problem: Self-Concept: Goal: Level of anxiety will decrease Outcome: Progressing   Problem: Education: Goal: Knowledge of the prescribed therapeutic regimen will improve Outcome: Progressing   Problem: Activity: Goal: Imbalance in normal sleep/wake cycle will improve Outcome: Progressing

## 2023-03-05 NOTE — Progress Notes (Signed)
Patient noted to stay in her room most of shift even with encouragement by staff to come out of her room into the milieu. Pt out of room for diner.  Medication compliant, pt declined to attend group.  Minimal interaction observed with peers. Pt denied SI/HI and AVH. Safety maintained.  03/05/23 0820  Psych Admission Type (Psych Patients Only)  Admission Status Involuntary  Psychosocial Assessment  Patient Complaints Depression;Hopelessness  Eye Contact Brief  Facial Expression Flat;Sad  Affect Depressed  Speech Logical/coherent  Interaction Childlike;Minimal  Motor Activity Slow  Appearance/Hygiene Unremarkable  Behavior Characteristics Appropriate to situation  Mood Depressed;Sad;Preoccupied  Thought Process  Coherency WDL  Content WDL  Delusions None reported or observed  Perception WDL  Hallucination None reported or observed  Judgment Poor  Confusion None  Danger to Self  Current suicidal ideation? Denies  Self-Injurious Behavior No self-injurious ideation or behavior indicators observed or expressed   Agreement Not to Harm Self Yes  Description of Agreement Verbal  Danger to Others  Danger to Others None reported or observed

## 2023-03-05 NOTE — Progress Notes (Signed)
   03/05/23 1027  15 Minute Checks  Location Bedroom  Visual Appearance Calm  Behavior Composed  Sleep (Behavioral Health Patients Only)  Calculate sleep? (Click Yes once per 24 hr at 0600 safety check) Yes  Documented sleep last 24 hours 8.5

## 2023-03-05 NOTE — [facility] Group Notes (Signed)
Adult Psychoeducational Group Note  Date:  03/05/2023 Time:  12:17 AM  Group Topic/Focus:  Wrap-Up Group:   The focus of this group is to help patients review their daily goal of treatment and discuss progress on daily workbooks.  Participation Level:  Active  Participation Quality:  Appropriate  Affect:  Appropriate  Cognitive:  Appropriate  Insight: Appropriate  Engagement in Group:  Engaged  Modes of Intervention:  Support  Additional Comments:  Pt rated today a 5 out of 10. Pt stated that her goal for today was to get out of her room more, which she said she achieved her goal. Pt coping skill is to come to group , reading, word search, and talking to friends.  Satira Anis 03/05/2023, 12:17 AM

## 2023-03-06 DIAGNOSIS — F251 Schizoaffective disorder, depressive type: Secondary | ICD-10-CM | POA: Diagnosis not present

## 2023-03-06 NOTE — [facility] Group Notes (Signed)
Spiritual care group facilitated by Chaplain Dyanne Carrel, [facility]    Group focused on topic of strength. Group members reflected on what thoughts and feelings emerge when they hear this topic. They then engaged in facilitated dialog around how strength is present in their lives. This dialog focused on representing what strength had been to them in their lives (images and patterns given) and what they saw as helpful in their life now (what they needed / wanted).  Activity drew on narrative framework.  Patient Progress:Yolanda [name] was present for part of group, but did not participate.

## 2023-03-06 NOTE — Plan of Care (Signed)
  Problem: Education: Goal: Utilization of techniques to improve thought processes will improve Outcome: Progressing   Problem: Education: Goal: Knowledge of the prescribed therapeutic regimen will improve Outcome: Progressing   Problem: Coping: Goal: Coping ability will improve Outcome: Progressing   Problem: Coping: Goal: Will verbalize feelings Outcome: Progressing   Problem: Safety: Goal: Ability to disclose and discuss suicidal ideas will improve Outcome: Progressing   Problem: Self-Concept: Goal: Level of anxiety will decrease Outcome: Progressing

## 2023-03-06 NOTE — Group Note (Signed)
Recreation Therapy Group Note   Group Topic:Other  Group Date: 03/06/2023 Start Time: 1400 End Time: 1440 Facilitators: Niam Nepomuceno-McCall, LRT,CTRS Location: [address] Dayroom   Activity Description/Intervention: Therapeutic Drumming. Patients with peers and staff were given the opportunity to engage in a leader facilitated [facility] Group Empowerment Drumming Circle with staff from the [employer], in partnership with [employer]. Teaching laboratory technician and trained [employer], Theodoro Doing leading with LRT observing and documenting intervention and pt response. This evidenced-based practice targets 7 areas of health and wellbeing in the human experience including: stress-reduction, exercise, self-expression, camaraderie/support, nurturing, spirituality, and music-making (leisure).   Goal Area(s) Addresses:  Patient will engage in pro-social way in music group.  Patient will follow directions of drum leader on the first prompt. Patient will demonstrate no behavioral issues during group.  Patient will identify if a reduction in stress level occurs as a result of participation in therapeutic drum circle.    Education: Leisure exposure, Pharmacologist, Musical expression, Discharge Planning   Affect/Mood: Flat   Participation Level: None   Participation Quality: None   Behavior: On-looking   Speech/Thought Process: None   Insight: None   Judgement: None   Modes of Intervention: Teaching laboratory technician   Patient Response to Interventions:  Disengaged   Education Outcome:  Acknowledges education   Clinical Observations/Individualized Feedback: Pt came for the last few minutes of group. Pt sat by the window and observed.    Plan: Continue to engage patient in RT group sessions 2-3x/week.   Yolanda [name], LRT,CTRS 03/06/2023 3:21 PM

## 2023-03-06 NOTE — [facility] Group Notes (Signed)
Adult Psychoeducational Group Note  Date:  03/06/2023 Time:  10:02 PM  Group Topic/Focus:  Wrap-Up Group:   The focus of this group is to help patients review their daily goal of treatment and discuss progress on daily workbooks.  Participation Level:  Active  Participation Quality:  Appropriate  Affect:  Appropriate  Cognitive:  Appropriate  Insight: Appropriate  Engagement in Group:  Engaged  Modes of Intervention:  Discussion  Additional Comments:  Pt attended group. Stated day was 10.  Joselyn Arrow 03/06/2023, 10:02 PM

## 2023-03-06 NOTE — Progress Notes (Signed)
Patient noted to be out in dayroom and milieu on shift, minimal interaction observed with some peers. Medication and group compliant. Pt denied SI/HI and AVH. Safety maintained.  03/06/23 0825  Psych Admission Type (Psych Patients Only)  Admission Status Involuntary  Psychosocial Assessment  Patient Complaints Anxiety;Depression  Eye Contact Brief  Facial Expression Flat;Sad  Affect Depressed;Sad  Speech Logical/coherent  Interaction Childlike;Minimal  Motor Activity Slow  Appearance/Hygiene Unremarkable  Behavior Characteristics Calm;Appropriate to situation  Mood Depressed;Sad  Thought Process  Coherency WDL  Content WDL  Delusions None reported or observed  Perception WDL  Hallucination None reported or observed  Judgment Poor  Confusion None  Danger to Self  Current suicidal ideation? Denies  Agreement Not to Harm Self Yes  Description of Agreement Verbal  Danger to Others  Danger to Others None reported or observed

## 2023-03-06 NOTE — Plan of Care (Signed)
  Problem: Activity: Goal: Interest or engagement in leisure activities will improve Outcome: Progressing   Problem: Coping: Goal: Coping ability will improve Outcome: Progressing   Problem: Coping: Goal: Coping ability will improve Outcome: Progressing   Problem: Medication: Goal: Compliance with prescribed medication regimen will improve Outcome: Progressing   Problem: Activity: Goal: Sleeping patterns will improve Outcome: Progressing   Problem: Safety: Goal: Periods of time without injury will increase Outcome: Progressing

## 2023-03-06 NOTE — Progress Notes (Signed)
   03/06/23 9147  15 Minute Checks  Location Bedroom  Visual Appearance Calm  Behavior Sleeping  Sleep (Behavioral Health Patients Only)  Calculate sleep? (Click Yes once per 24 hr at 0600 safety check) Yes  Documented sleep last 24 hours 8

## 2023-03-06 NOTE — Progress Notes (Signed)
[facility] MD Progress Note  03/06/2023 5:06 PM Yolanda [name]  MRN:  [id number]  Principal Problem: Schizoaffective disorder, depressive type (HCC)  Diagnosis: Principal Problem:   Schizoaffective disorder, depressive type (HCC) Active Problems:   GERD (gastroesophageal reflux disease)   Intellectual disability   Tobacco use disorder  Reason for admission: This is the first psychiatric admission in this [facility] in 12 years for this 47 AA female with an extensive hx of mental illnesses & probable polysubstance use disorders. She is admitted to the [facility] from the [facility] hospital with complain of worsening suicidal ideations with plan to stab herself. Per chart review, patient apparently reported at the ED that she has been depressed for a while & has not been taking her mental health medications. After medical evaluation.clearance, she was transferred to the [facility] for further psychiatric evaluation/treatments.   24 hr chart review: Sleep Hours last night: 8.5 hrs as per nursing documentation. Nursing Concerns: Isolative in room & not attending unit group sessions sometimes. Behavioral episodes in the past 24 WUJ:WJXB reported or documented Medication Compliance: Compliant  Vital Signs in the past 24 hrs: Pulse is elevated, 111 per documentation. Staff  will recheck. Patient is in no apparent distress.  PRN Medications in the past 24 hrs: Hydroxyzine  Patient assessment note: Yolanda [name] is seen in her room. She presents alert & oriented. There have not been any changes noted. Patient continues to do well on her treatment regimen. She is obviously sad about being here as long as she has so far. However, she continues to build on hope of hearing the anticipated news of a place to call home. There are no behavioral issues reported by staff. She is out of her room & attends groups sessions. Sometimes she has to be encouraged to come out of her to participate in group sessions. She currently denies any SIHI,  AVH, delusional thoughts or paranoia. She does not appear to be responding to any internal stimuli. Will continue current plan of care as already in progress. Vital signs remain stable.  Discussed the following psychosocial stressors: Continuous hospitalization being addressed with CSW.    We continue to await DSS to coordinate with pt's treatment team for placement in a living arrangement outside of this hospital that would be beneficial for her.  It is preferable for pt to be assigned a legal guardian by DSS. CSW is in discussions with DSS regarding 47.  No TD/EPS type symptoms found on assessment, and pt denies any feelings of stiffness. AIMS: 0.  Continuing all meds as below with no changes today, and continuing to monitor. Pt is being given positive reinforcements to get out of her room for unit group activities and to tend to personal hygiene needs.   Total Time spent with patient:  45 minutes  Past Psychiatric History: See H & P  Past Medical History:  Past Medical History:  Diagnosis Date   Bipolar affect, depressed (HCC)    Constipation 08/17/2022   Depression    Falls 07/21/2022   Fracture of femoral neck, right, closed (HCC) 01/17/2022   Herpes simplex 08/22/2017   Open fracture dislocation of right elbow joint 01/17/2022    Past Surgical History:  Procedure Laterality Date   NO PAST SURGERIES     SALPINGECTOMY     Family History: History reviewed. No pertinent family history.  Family Psychiatric  History: See H & P  Social History:  Social History   Substance and Sexual Activity  Alcohol Use Yes  Social History   Substance and Sexual Activity  Drug Use Yes   Types: Cocaine, Marijuana    Social History   Socioeconomic History   Marital status: Single    Spouse name: Not on file   Number of children: Not on file   Years of education: Not on file   Highest education level: Not on file  Occupational History   Not on file  Tobacco Use   Smoking  status: Every Day   Smokeless tobacco: Not on file  Substance and Sexual Activity   Alcohol use: Yes   Drug use: Yes    Types: Cocaine, Marijuana   Sexual activity: Yes  Other Topics Concern   Not on file  Social History Narrative   Not on file   Social Determinants of Health   Financial Resource Strain: Low Risk  (09/12/2022)   Received from [facility], [facility]   Overall Financial Resource Strain (CARDIA)    Difficulty of Paying Living Expenses: Not hard at all  Food Insecurity: Patient Declined (12/20/2022)   Hunger Vital Sign    Worried About Running Out of Food in the Last Year: Patient declined    Ran Out of Food in the Last Year: Patient declined  Transportation Needs: No Transportation Needs (12/20/2022)   PRAPARE - Administrator, Civil Service (Medical): No    Lack of Transportation (Non-Medical): No  Physical Activity: Not on file  Stress: No Stress Concern Present (07/17/2022)   Received from [facility], [facility] - Occupational Stress Questionnaire    Feeling of Stress : Not at all  Social Connections: Unknown (07/16/2022)   Received from [facility], [facility]   Social Network    Social Network: Not on file   Sleep: Good  Appetite:  Good  Current Medications: Current Facility-Administered Medications  Medication Dose Route Frequency Provider Last Rate Last Admin   acetaminophen (TYLENOL) tablet 650 mg  650 mg Oral Q6H PRN [name], NP   650 mg at 02/25/23 2100   alum & mag hydroxide-simeth (MAALOX/MYLANTA) 200-200-20 MG/5ML suspension 30 mL  30 mL Oral Q4H PRN [name], NP   30 mL at 01/28/23 1530   busPIRone (BUSPAR) tablet 15 mg  15 mg Oral BID [name], NP   15 mg at 03/06/23 1616   cyanocobalamin (VITAMIN B12) injection 1,000 mcg  1,000 mcg Intramuscular Q30 days Sarita Bottom, MD   1,000 mcg at 03/04/23 1421   diphenhydrAMINE (BENADRYL) capsule 50 mg  50 mg Oral TID PRN  [name], NP   50 mg at 01/15/23 1211   Or   diphenhydrAMINE (BENADRYL) injection 50 mg  50 mg Intramuscular TID PRN [name], NP       feeding supplement (ENSURE ENLIVE / ENSURE PLUS) liquid 237 mL  237 mL Oral TID BM Nkwenti, Doris, NP   237 mL at 03/06/23 1451   haloperidol (HALDOL) tablet 5 mg  5 mg Oral TID PRN [name], NP   5 mg at 01/15/23 1211   Or   haloperidol lactate (HALDOL) injection 5 mg  5 mg Intramuscular TID PRN [name], NP       hydrOXYzine (ATARAX) tablet 25 mg  25 mg Oral TID PRN [name], DO   25 mg at 03/04/23 2123   LORazepam (ATIVAN) tablet 2 mg  2 mg Oral TID PRN [name], NP   2 mg at 01/15/23 1211  Or   LORazepam (ATIVAN) injection 2 mg  2 mg Intramuscular TID PRN [name], NP       magnesium hydroxide (MILK OF MAGNESIA) suspension 30 mL  30 mL Oral Daily PRN [name], NP   30 mL at 02/26/23 1828   melatonin tablet 5 mg  5 mg Oral QHS Nkwenti, Doris, NP   5 mg at 03/05/23 2100   methylphenidate (RITALIN) tablet 5 mg  5 mg Oral Daily Massengill, Nathan, MD   5 mg at 03/06/23 0818   nicotine (NICODERM CQ - dosed in mg/24 hours) patch 21 mg  21 mg Transdermal Daily [name], DO   21 mg at 03/06/23 3086   paliperidone (INVEGA) 24 hr tablet 6 mg  6 mg Oral Daily [name], NP   6 mg at 03/05/23 2101   pantoprazole (PROTONIX) EC tablet 40 mg  40 mg Oral Daily [name], NP   40 mg at 03/06/23 0819   polyethylene glycol (MIRALAX / GLYCOLAX) packet 17 g  17 g Oral Daily PRN [name], NP   17 g at 03/01/23 1352   sertraline (ZOLOFT) tablet 150 mg  150 mg Oral Daily Massengill, Harrold Donath, MD   150 mg at 03/06/23 0818   traZODone (DESYREL) tablet 50 mg  50 mg Oral QHS [name], NP   50 mg at 03/05/23 2101   Vitamin D (Ergocalciferol) (DRISDOL) 1.25 MG (50000 UNIT) capsule 50,000 Units  50,000 Units Oral Q7 days [name], NP   50,000 Units at 03/02/23 1607   Lab Results: No results found for this or any  previous visit (from the past 48 hour(s)).  Blood Alcohol level:  Lab Results  Component Value Date   ETH <10 12/19/2022   ETH <10 11/21/2019   Metabolic Disorder Labs: Lab Results  Component Value Date   HGBA1C 4.4 (L) 12/21/2022   MPG 79.58 12/21/2022   No results found for: "PROLACTIN" Lab Results  Component Value Date   CHOL 218 (H) 12/21/2022   TRIG 81 12/21/2022   HDL 53 12/21/2022   CHOLHDL 4.1 12/21/2022   VLDL 16 12/21/2022   LDLCALC 149 (H) 12/21/2022   LDLCALC 110 (H) 03/13/2011   Physical Findings: AIMS: Facial and Oral Movements Muscles of Facial Expression: None, normal Lips and Perioral Area: None, normal Jaw: None, normal Tongue: None, normal,Extremity Movements Upper (arms, wrists, hands, fingers): None, normal Lower (legs, knees, ankles, toes): None, normal, Trunk Movements Neck, shoulders, hips: None, normal, Overall Severity Severity of abnormal movements (highest score from questions above): None, normal Incapacitation due to abnormal movements: None, normal Patient's awareness of abnormal movements (rate only patient's report): No Awareness, Dental Status Current problems with teeth and/or dentures?: No Does patient usually wear dentures?: No  CIWA:    COWS:    AIMS:0 Musculoskeletal: Strength & Muscle Tone: within normal limits Gait & Station: normal Patient leans: N/A  Psychiatric Specialty Exam:  Presentation  General Appearance:  Appropriate for Environment; Casual  Eye Contact: Fair  Speech: Clear and Coherent  Speech Volume: Normal  Handedness: Right  Mood and Affect  Mood: Depressed  Affect: Congruent  Thought Process  Thought Processes: Coherent  Descriptions of Associations:Intact  Orientation:Partial  Thought Content:Logical  History of Schizophrenia/Schizoaffective disorder:Yes  Duration of Psychotic Symptoms:Greater than six months  Hallucinations:Hallucinations: None  Ideas of  Reference:None  Suicidal Thoughts:Suicidal Thoughts: No  Homicidal Thoughts:Homicidal Thoughts: No  Sensorium  Memory: Immediate Good  Judgment: Poor  Insight: Poor  Art therapist  Concentration: Poor  Attention Span: Poor  Recall: Poor  Fund of Knowledge: Poor  Language: Fair  Psychomotor Activity  Psychomotor Activity: Psychomotor Activity: Normal   Assets  Assets: Resilience  Sleep  Sleep: Sleep: Good  Physical Exam: Physical Exam Vitals and nursing note reviewed.  Constitutional:      Appearance: Normal appearance.  HENT:     Nose: Nose normal.  Eyes:     Pupils: Pupils are equal, round, and reactive to light.  Pulmonary:     Effort: Pulmonary effort is normal.  Genitourinary:    Comments: Deferred Musculoskeletal:        General: Normal range of motion.     Cervical back: Normal range of motion.  Neurological:     Mental Status: She is alert and oriented to person, place, and time.    Review of Systems  Constitutional:  Negative for fever.  HENT:  Negative for hearing loss.   Eyes:  Negative for blurred vision.  Respiratory:  Negative for cough.   Cardiovascular:  Negative for chest pain.  Gastrointestinal:  Negative for heartburn.  Genitourinary:  Negative for dysuria.  Musculoskeletal:  Negative for myalgias.  Skin:  Negative for rash.  Neurological:  Negative for dizziness.  Psychiatric/Behavioral:  Positive for depression. Negative for hallucinations, memory loss, substance abuse and suicidal ideas. The patient is nervous/anxious and has insomnia.    Blood pressure 90/68, pulse (!) 111, temperature 98.3 F (36.8 C), temperature source Oral, resp. rate 16, height 4\' 11"  (1.499 m), weight 63 kg, SpO2 98%. Body mass index is 28.05 kg/m.  Treatment Plan Summary: Daily contact with patient to assess and evaluate symptoms and progress in treatment and Medication management.    Still waiting on safe disposition as patient  would not be able to live independently given cognitive impairment. Recommend assisted living.    No medication side effects.  Because of urinary incontinence, recommended adult diapers to be worn.   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  Plan:  -Continue Ritalin 5 mg daily to help with energy & wakefulness -Continue Vitamin D 50.000 units weekly for bone health. -Continue Sertraline150 mg po Q daily for depression/anxiety -Continue Buspar 15 mg po bid for anxiety.  -Continue paliperidone 6 mg po qd for mood stabilization -Continue Trazodone 50 mg nightly for insomnia  -Continue Nicoderm 21 mg topically Q 24 hrs for nicotine withdrawal management. -Continue vitamin B12 1000 mcg IM weekly for 4 weeks then monthly -Continue Melatonin 5 mg nightly for sleep -Continue Protonix EC 40 mg p.o. daily for GERD -Continue MiraLAX 17 g p.o. PRN for constipation -Continue hydroxyzine 25 mg p.o. 3 times daily as needed for anxiety -Continue Ensure nutritional shakes TID in between meals  -Previously discontinued Hydroxyzine 50 mg nightly-hypotension in the mornings   Safety and Monitoring: Voluntary admission to inpatient psychiatric unit for safety, stabilization and treatment Daily contact with patient to assess and evaluate symptoms and progress in treatment Patient's case to be discussed in multi-disciplinary team meeting Observation Level : q15 minute checks Vital signs: q12 hours Precautions: Safety   Discharge Planning: Social work and case management to assist with discharge planning and identification of hospital follow-up needs prior to discharge Estimated LOS: Unknown at this time. Discharge Concerns: Need to establish  a safety plan; Medication compliance  and effectiveness Discharge Goals: Return home with outpatient referrals for mental health follow-up including medication management/psychotherapy No legal guardian, has payee  Armandina Stammer, NP, pmhnp 03/06/2023, 5:06 PM Patient ID: Yolanda [name], female   DOB: 04-Apr-1976, 47 y.o.   MRN: [id number]  Patient ID: Yolanda [name], female   DOB: [birth year], 47 y.o.   MRN: [id number]

## 2023-03-06 NOTE — Group Note (Signed)
Date:  03/06/2023 Time:  9:40 AM  Group Topic/Focus:  Goals Group:   The focus of this group is to help patients establish daily goals to achieve during treatment and discuss how the patient can incorporate goal setting into their daily lives to aide in recovery.    Participation Level:  Did Not Attend    Yolanda [name] 03/06/2023, 9:40 AM

## 2023-03-07 DIAGNOSIS — F251 Schizoaffective disorder, depressive type: Secondary | ICD-10-CM | POA: Diagnosis not present

## 2023-03-07 NOTE — [facility] Counselor (Signed)
CSW attended a meeting via teams along with participants Dr. Enedina [name], Yolanda [name] , Yolanda [name] and [place] [facility] [employer] and DSS SW Yolanda [name] to discuss current disposition of pt's potential placement . Partner's representatives requested an FL2 and to be completed by CSW and MD. (Awaiting MD completion) . DSS will reach out to Medicaid to change plan from [facility] to [facility] and request a tailored Medicaid plan. Ms. Yolanda [name] will also start a petition for Guardianship. [name] will continue to search for appropriate placement simultaneously.  CSW Will continue to monitor.

## 2023-03-07 NOTE — Progress Notes (Signed)
[facility] MD Progress Note  03/07/2023 3:07 PM Yolanda [name]  MRN:  [id number]  Principal Problem: Schizoaffective disorder, depressive type (HCC)  Diagnosis: Principal Problem:   Schizoaffective disorder, depressive type (HCC) Active Problems:   GERD (gastroesophageal reflux disease)   Intellectual disability   Tobacco use disorder  Reason for admission: This is the first psychiatric admission in this [facility] in 12 years for this 47 AA female with an extensive hx of mental illnesses & probable polysubstance use disorders. She is admitted to the [facility] from the [facility] hospital with complain of worsening suicidal ideations with plan to stab herself. Per chart review, patient apparently reported at the ED that she has been depressed for a while & has not been taking her mental health medications. After medical evaluation.clearance, she was transferred to the [facility] for further psychiatric evaluation/treatments.   24 hr chart review: Sleep Hours last night: 8 hours. Nursing Concerns: Self-isolation at times, requires encouragement to attend  group sessions sometimes. Behavioral episodes in the past 24 hrs: None reported or documented Medication Compliance: Compliant  Vital Signs in the past 24 hrs: Pulse rate fluctuates. Patient is in no apparent distress.  PRN Medications in the past 24 hrs: Hydroxyzine on a prn basis.  Patient assessment note: Yolanda [name] is seen in her room this morning. She was lying down in bed. She presents alert, oriented & aware of situation. She did flash a smile. She says she is doing fine. Says she is fixing to come out of her room to attend group sessions. She says she is doing okay. Denies any complaints. She says she is doing well on her medications. Denies any side effects. She currently denies any SIHI, AVH, delusional thoughts or paranoia. She does not appear to be responding to any internal stimuli. There are no changes made on the current plan of care, will continues as  already in progress.   A meeting was held today with the attending psychiatrist, CSW & a DSS workers present. The meeting was discuss the discuss current disposition of pt's potential placement . Partner's representatives requested an FL2 and to be completed by CSW and MD. (Awaiting MD completion) . DSS will reach out to Medicaid to change plan from [facility] to [facility] and request a tailored Medicaid plan. Yolanda [name] will also start a petition for Guardianship. Yolanda [name] will continue to search for appropriate placement simultaneously. CSW Will continue to monitor.   Discussed the following psychosocial stressors: Continuous hospitalization being addressed with CSW.    We continue to await DSS to coordinate with pt's treatment team for placement in a living arrangement outside of this hospital that would be beneficial for her.  It is preferable for pt to be assigned a legal guardian by DSS. CSW is in discussions with DSS regarding this.  No TD/EPS type symptoms found on assessment, and pt denies any feelings of stiffness. AIMS: 0.  Continuing all meds as below with no changes today, and continuing to monitor. Pt is being given positive reinforcements to get out of her room for unit group activities and to tend to personal hygiene needs.   Total Time spent with patient:  45 minutes  Past Psychiatric History: See H & P  Past Medical History:  Past Medical History:  Diagnosis Date   Bipolar affect, depressed (HCC)    Constipation 08/17/2022   Depression    Falls 07/21/2022   Fracture of femoral neck, right, closed (HCC) 01/17/2022   Herpes simplex  08/22/2017   Open fracture dislocation of right elbow joint 01/17/2022    Past Surgical History:  Procedure Laterality Date   NO PAST SURGERIES     SALPINGECTOMY     Family History: History reviewed. No pertinent family history.  Family Psychiatric  History: See H & P  Social History:  Social History   Substance and  Sexual Activity  Alcohol Use Yes     Social History   Substance and Sexual Activity  Drug Use Yes   Types: Cocaine, Marijuana    Social History   Socioeconomic History   Marital status: Single    Spouse name: Not on file   Number of children: Not on file   Years of education: Not on file   Highest education level: Not on file  Occupational History   Not on file  Tobacco Use   Smoking status: Every Day   Smokeless tobacco: Not on file  Substance and Sexual Activity   Alcohol use: Yes   Drug use: Yes    Types: Cocaine, Marijuana   Sexual activity: Yes  Other Topics Concern   Not on file  Social History Narrative   Not on file   Social Determinants of Health   Financial Resource Strain: Low Risk  (09/12/2022)   Received from [facility], [facility]   Overall Financial Resource Strain (CARDIA)    Difficulty of Paying Living Expenses: Not hard at all  Food Insecurity: Patient Declined (12/20/2022)   Hunger Vital Sign    Worried About Running Out of Food in the Last Year: Patient declined    Ran Out of Food in the Last Year: Patient declined  Transportation Needs: No Transportation Needs (12/20/2022)   PRAPARE - Administrator, Civil Service (Medical): No    Lack of Transportation (Non-Medical): No  Physical Activity: Not on file  Stress: No Stress Concern Present (07/17/2022)   Received from [facility], [facility] of Occupational Health - Occupational Stress Questionnaire    Feeling of Stress : Not at all  Social Connections: Unknown (07/16/2022)   Received from [facility], [facility]   Social Network    Social Network: Not on file   Sleep: Good  Appetite:  Good  Current Medications: Current Facility-Administered Medications  Medication Dose Route Frequency Provider Last Rate Last Admin   acetaminophen (TYLENOL) tablet 650 mg  650 mg Oral Q6H PRN [name], NP   650 mg at 02/25/23 2100   alum & mag hydroxide-simeth  (MAALOX/MYLANTA) 200-200-20 MG/5ML suspension 30 mL  30 mL Oral Q4H PRN [name], NP   30 mL at 01/28/23 1530   busPIRone (BUSPAR) tablet 15 mg  15 mg Oral BID [name], NP   15 mg at 03/07/23 0825   cyanocobalamin (VITAMIN B12) injection 1,000 mcg  1,000 mcg Intramuscular Q30 days [name], Nadir, MD   1,000 mcg at 03/04/23 1421   diphenhydrAMINE (BENADRYL) capsule 50 mg  50 mg Oral TID PRN [name], NP   50 mg at 01/15/23 1211   Or   diphenhydrAMINE (BENADRYL) injection 50 mg  50 mg Intramuscular TID PRN [name], NP       feeding supplement (ENSURE ENLIVE / ENSURE PLUS) liquid 237 mL  237 mL Oral TID BM Nkwenti, Doris, NP   237 mL at 03/07/23 1435   haloperidol (HALDOL) tablet 5 mg  5 mg Oral TID PRN [name], NP   5 mg at 01/15/23  1211   Or   haloperidol lactate (HALDOL) injection 5 mg  5 mg Intramuscular TID PRN [name], NP       hydrOXYzine (ATARAX) tablet 25 mg  25 mg Oral TID PRN [name], DO   25 mg at 03/06/23 1816   LORazepam (ATIVAN) tablet 2 mg  2 mg Oral TID PRN [name], NP   2 mg at 01/15/23 1211   Or   LORazepam (ATIVAN) injection 2 mg  2 mg Intramuscular TID PRN [name], NP       magnesium hydroxide (MILK OF MAGNESIA) suspension 30 mL  30 mL Oral Daily PRN [name], NP   30 mL at 02/26/23 1828   melatonin tablet 5 mg  5 mg Oral QHS Nkwenti, Doris, NP   5 mg at 03/06/23 2116   methylphenidate (RITALIN) tablet 5 mg  5 mg Oral Daily Massengill, Nathan, MD   5 mg at 03/07/23 1610   nicotine (NICODERM CQ - dosed in mg/24 hours) patch 21 mg  21 mg Transdermal Daily [name], DO   21 mg at 03/06/23 0819   paliperidone (INVEGA) 24 hr tablet 6 mg  6 mg Oral Daily [name], NP   6 mg at 03/06/23 2116   pantoprazole (PROTONIX) EC tablet 40 mg  40 mg Oral Daily [name], NP   40 mg at 03/07/23 9604   polyethylene glycol (MIRALAX / GLYCOLAX) packet 17 g  17 g Oral Daily PRN [name], NP   17 g at 03/01/23 1352    sertraline (ZOLOFT) tablet 150 mg  150 mg Oral Daily Massengill, Harrold Donath, MD   150 mg at 03/07/23 5409   traZODone (DESYREL) tablet 50 mg  50 mg Oral QHS [name], NP   50 mg at 03/06/23 2116   Vitamin D (Ergocalciferol) (DRISDOL) 1.25 MG (50000 UNIT) capsule 50,000 Units  50,000 Units Oral Q7 days [name], NP   50,000 Units at 03/02/23 1607   Lab Results: No results found for this or any previous visit (from the past 48 hour(s)).  Blood Alcohol level:  Lab Results  Component Value Date   ETH <10 12/19/2022   ETH <10 11/21/2019   Metabolic Disorder Labs: Lab Results  Component Value Date   HGBA1C 4.4 (L) 12/21/2022   MPG 79.58 12/21/2022   No results found for: "PROLACTIN" Lab Results  Component Value Date   CHOL 218 (H) 12/21/2022   TRIG 81 12/21/2022   HDL 53 12/21/2022   CHOLHDL 4.1 12/21/2022   VLDL 16 12/21/2022   LDLCALC 149 (H) 12/21/2022   LDLCALC 110 (H) 03/13/2011   Physical Findings: AIMS: Facial and Oral Movements Muscles of Facial Expression: None, normal Lips and Perioral Area: None, normal Jaw: None, normal Tongue: None, normal,Extremity Movements Upper (arms, wrists, hands, fingers): None, normal Lower (legs, knees, ankles, toes): None, normal, Trunk Movements Neck, shoulders, hips: None, normal, Overall Severity Severity of abnormal movements (highest score from questions above): None, normal Incapacitation due to abnormal movements: None, normal Patient's awareness of abnormal movements (rate only patient's report): No Awareness, Dental Status Current problems with teeth and/or dentures?: No Does patient usually wear dentures?: No  CIWA:    COWS:    AIMS:0 Musculoskeletal: Strength & Muscle Tone: within normal limits Gait & Station: normal Patient leans: N/A  Psychiatric Specialty Exam:  Presentation  General Appearance:  Appropriate for Environment; Casual; Fairly Groomed  Eye Contact: Fair  Speech: Clear and Coherent; Normal  Rate  Speech Volume: Normal  Handedness: Right  Mood and Affect  Mood: -- (Stabilizing on daily basis.)  Affect: Congruent  Thought Process  Thought Processes: Coherent; Goal Directed  Descriptions of Associations:Intact  Orientation:Full (Time, Place and Person)  Thought Content:Logical  History of Schizophrenia/Schizoaffective disorder:Yes  Duration of Psychotic Symptoms:Greater than six months  Hallucinations:Hallucinations: None Description of Auditory Hallucinations: NA   Ideas of Reference:None  Suicidal Thoughts:Suicidal Thoughts: No SI Active Intent and/or Plan: Without Intent; Without Means to Carry Out; Without Plan; Without Access to Means SI Passive Intent and/or Plan: Without Intent; Without Plan; Without Means to Carry Out; Without Access to Means   Homicidal Thoughts:Homicidal Thoughts: No   Sensorium  Memory: Immediate Good; Recent Good; Remote Good  Judgment: -- (Limited)  Insight: -- (Limited)  Executive Functions  Concentration: Fair  Attention Span: Fair  Recall: [employer] of Knowledge: -- (Limited)  Language: Fair  Psychomotor Activity  Psychomotor Activity: Psychomotor Activity: Normal   Assets  Assets: Communication Skills; Desire for Improvement; Financial Resources/Insurance; Resilience; Social Support  Sleep  Sleep: Sleep: Good Number of Hours of Sleep: 8  Physical Exam: Physical Exam Vitals and nursing note reviewed.  Constitutional:      Appearance: Normal appearance.  HENT:     Nose: Nose normal.  Eyes:     Pupils: Pupils are equal, round, and reactive to light.  Pulmonary:     Effort: Pulmonary effort is normal.  Genitourinary:    Comments: Deferred Musculoskeletal:        General: Normal range of motion.     Cervical back: Normal range of motion.  Neurological:     Mental Status: She is alert and oriented to person, place, and time.    Review of Systems  Constitutional:  Negative  for fever.  HENT:  Negative for hearing loss.   Eyes:  Negative for blurred vision.  Respiratory:  Negative for cough.   Cardiovascular:  Negative for chest pain.  Gastrointestinal:  Negative for heartburn.  Genitourinary:  Negative for dysuria.  Musculoskeletal:  Negative for myalgias.  Skin:  Negative for rash.  Neurological:  Negative for dizziness.  Psychiatric/Behavioral:  Positive for depression. Negative for hallucinations, memory loss, substance abuse and suicidal ideas. The patient is nervous/anxious and has insomnia.    Blood pressure (!) 89/59, pulse 70, temperature 98.3 F (36.8 C), temperature source Oral, resp. rate 16, height 4\' 11"  (1.499 m), weight 63 kg, SpO2 98%. Body mass index is 28.05 kg/m.  Treatment Plan Summary: Daily contact with patient to assess and evaluate symptoms and progress in treatment and Medication management.    Still waiting on safe disposition as patient would not be able to live independently given cognitive impairment. Recommend assisted living.    No medication side effects reported.  Because of urinary incontinence, recommended adult diapers to be worn.   Principal/active diagnoses:  Schizoaffective disorder, depressive type (HCC)  Active Problems:   GERD (gastroesophageal reflux disease)   Intellectual disability   Tobacco use disorder (MOCA 16/30) moderate cognitive impairment probably related to moderate intellectual disability.  Plan:  -Continue Ritalin 5 mg daily to help with energy & wakefulness -Continue Vitamin D 50.000 units weekly for bone health. -Continue Sertraline150 mg po Q daily for depression/anxiety -Continue Buspar 15 mg po bid for anxiety.  -Continue paliperidone 6 mg po qd for mood stabilization -Continue Trazodone 50 mg nightly for insomnia  -Continue Nicoderm 21 mg topically Q 24 hrs for nicotine withdrawal management. -Continue vitamin B12 1000  mcg IM weekly for 4 weeks then monthly -Continue Melatonin 5 mg  nightly for sleep -Continue Protonix EC 40 mg p.o. daily for GERD -Continue MiraLAX 17 g p.o. PRN for constipation -Continue hydroxyzine 25 mg p.o. 3 times daily as needed for anxiety -Continue Ensure nutritional shakes TID in between meals  -Previously discontinued Hydroxyzine 50 mg nightly-hypotension in the mornings   Safety and Monitoring: Voluntary admission to inpatient psychiatric unit for safety, stabilization and treatment Daily contact with patient to assess and evaluate symptoms and progress in treatment Patient's case to be discussed in multi-disciplinary team meeting Observation Level : q15 minute checks Vital signs: q12 hours Precautions: Safety   Discharge Planning: Social work and case management to assist with discharge planning and identification of hospital follow-up needs prior to discharge Estimated LOS: Unknown at this time. Discharge Concerns: Need to establish a safety plan; Medication compliance and effectiveness Discharge Goals: Return home with outpatient referrals for mental health follow-up including medication management/psychotherapy No legal guardian, has payee  Armandina Stammer, NP, pmhnp 03/07/2023, 3:07 PM Patient ID: Yolanda [name], female   DOB: [birth year], 47 y.o.   MRN: [id number] Patient ID: Yolanda [name], female   DOB: [birth year], 47 y.o.   MRN: [id number] Patient ID: Yolanda [name], female   DOB: [birth year], 47 y.o.   MRN: [id number]

## 2023-03-07 NOTE — Group Note (Signed)
Date:  03/07/2023 Time:  9:28 PM  Group Topic/Focus:  Wrap-Up Group:   The focus of this group is to help patients review their daily goal of treatment and discuss progress on daily workbooks.    Participation Level:  Active  Participation Quality:  Appropriate and Sharing  Affect:  Appropriate  Cognitive:  Appropriate  Insight: Appropriate and Improving  Engagement in Group:  Engaged  Modes of Intervention:  Discussion and Socialization  Additional Comments:  The group started off with a question; what is your favorite sport/hobby? The patient stated that her favorite sport is baseball. The patient stated that her goal for tomorrow is to find somewhere to go. The patient stated that she did talk with SW and there is a plan in place for finding somewhere to go.  Kennieth Francois 03/07/2023, 9:28 PM

## 2023-03-07 NOTE — Progress Notes (Signed)
   03/06/23 2120  Psych Admission Type (Psych Patients Only)  Admission Status Involuntary  Psychosocial Assessment  Patient Complaints Anxiety;Depression;Hopelessness  Eye Contact Brief  Facial Expression Flat;Sad  Affect Depressed;Sad  Speech Logical/coherent  Interaction Childlike;Minimal  Motor Activity Slow  Appearance/Hygiene Unremarkable  Behavior Characteristics Cooperative;Appropriate to situation  Mood Depressed;Sad  Thought Process  Coherency WDL  Content WDL  Delusions None reported or observed  Perception WDL  Hallucination None reported or observed  Judgment Poor  Confusion None  Danger to Self  Current suicidal ideation? Denies  Agreement Not to Harm Self Yes  Description of Agreement verbal  Danger to Others  Danger to Others None reported or observed

## 2023-03-07 NOTE — Plan of Care (Signed)
  Problem: Education: Goal: Knowledge of the prescribed therapeutic regimen will improve Outcome: Progressing   Problem: Coping: Goal: Coping ability will improve Outcome: Progressing   Problem: Safety: Goal: Ability to disclose and discuss suicidal ideas will improve Outcome: Progressing   Problem: Self-Concept: Goal: Level of anxiety will decrease Outcome: Progressing

## 2023-03-07 NOTE — Progress Notes (Signed)
Patient declined breakfast this AM. Tolerated lunch and dinner well. Medication compliant. Pt declined to attend group therapy. Minimal interaction observed with peers. Pt denied SI/HI and AVH. Safety maintained.  03/07/23 0818  Psych Admission Type (Psych Patients Only)  Admission Status Involuntary  Psychosocial Assessment  Patient Complaints Anxiety;Depression  Eye Contact Brief  Facial Expression Flat;Sad  Affect Depressed;Sad  Speech Logical/coherent  Interaction Childlike;Minimal  Motor Activity Slow  Appearance/Hygiene Unremarkable  Behavior Characteristics Guarded;Anxious  Mood Depressed;Sad  Thought Process  Coherency WDL  Content WDL  Delusions None reported or observed  Perception WDL  Hallucination None reported or observed  Judgment Poor  Confusion None  Danger to Self  Current suicidal ideation? Denies  Agreement Not to Harm Self Yes  Description of Agreement Verbal

## 2023-03-07 NOTE — Progress Notes (Signed)
   03/07/23 0615  15 Minute Checks  Location Bedroom  Visual Appearance Calm  Behavior Sleeping  Sleep (Behavioral Health Patients Only)  Calculate sleep? (Click Yes once per 24 hr at 0600 safety check) Yes  Documented sleep last 24 hours 9

## 2023-03-07 NOTE — BHH Group Notes (Signed)
Adult Psychoeducational Group Note  Date:  03/07/2023 Time:  10:11 AM  Group Topic/Focus:  Goals Group:   The focus of this group is to help patients establish daily goals to achieve during treatment and discuss how the patient can incorporate goal setting into their daily lives to aide in recovery.  Participation Level:  Did Not Attend  Participation Quality:    Affect:    Cognitive:    Insight:   Engagement in Group:    Modes of Intervention:    Additional Comments:  Pt did not attend group  [employer] 03/07/2023, 10:11 AM

## 2023-03-07 NOTE — [place] FL2 (Signed)
[place] MEDICAID FL2 LEVEL OF CARE FORM     IDENTIFICATION  Patient Name: Yolanda [name] Birthdate: Dec 13, 1975 Sex: female Admission Date (Current Location): 12/20/2022  [facility] and [place] Number:      Facility and Address:         Provider Number:    Attending Physician Name and Address:  [name], MD  Relative Name and Phone Number:       Current Level of Care:   Recommended Level of Care:   Prior Approval Number:    Date Approved/Denied:   PASRR Number:    Discharge Plan:      Current Diagnoses: Patient Active Problem List   Diagnosis Date Noted   Tobacco use disorder 01/01/2023   Bipolar 1 disorder, depressed (HCC) 11/13/2022   Suicide ideation 11/13/2022   Vitamin D deficiency 07/21/2022   Schizoaffective disorder (HCC) 07/18/2022   Trauma 01/17/2022   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   Pancreatic mass 07/31/2021   Self-cutting of wrist (HCC) 07/24/2020   Intellectual disability 07/23/2020   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   Non compliance w medication regimen 11/16/2019   Schizoaffective disorder, depressive type (HCC) 11/13/2019   GERD (gastroesophageal reflux disease) 06/03/2018    Orientation RESPIRATION BLADDER Height & Weight            Weight: 63 kg Height:  4\' 11"  (149.9 cm)  BEHAVIORAL SYMPTOMS/MOOD NEUROLOGICAL BOWEL NUTRITION STATUS           AMBULATORY STATUS COMMUNICATION OF NEEDS Skin                               Personal Care Assistance Level of Assistance              Functional Limitations Info             SPECIAL CARE FACTORS FREQUENCY                       Contractures      Additional Factors Info                  Current Medications (03/07/2023):  This is the current hospital active medication list Current Facility-Administered Medications  Medication Dose Route Frequency Provider Last Rate Last Admin    acetaminophen (TYLENOL) tablet 650 mg  650 mg Oral Q6H PRN [name], NP   650 mg at 02/25/23 2100   alum & mag hydroxide-simeth (MAALOX/MYLANTA) 200-200-20 MG/5ML suspension 30 mL  30 mL Oral Q4H PRN [name], NP   30 mL at 01/28/23 1530   busPIRone (BUSPAR) tablet 15 mg  15 mg Oral BID [name], NP   15 mg at 03/07/23 0825   cyanocobalamin (VITAMIN B12) injection 1,000 mcg  1,000 mcg Intramuscular Q30 days Sarita Bottom, MD   1,000 mcg at 03/04/23 1421   diphenhydrAMINE (BENADRYL) capsule 50 mg  50 mg Oral TID PRN [name], NP   50 mg at 01/15/23 1211   Or   diphenhydrAMINE (BENADRYL) injection 50 mg  50 mg Intramuscular TID PRN [name], NP       feeding supplement (ENSURE ENLIVE / ENSURE PLUS) liquid 237 mL  237 mL Oral TID BM Nkwenti, Doris, NP   237 mL at 03/07/23 1435   haloperidol (HALDOL) tablet 5 mg  5 mg Oral TID PRN Sanjuana Kava, NP  5 mg at 01/15/23 1211   Or   haloperidol lactate (HALDOL) injection 5 mg  5 mg Intramuscular TID PRN [name], NP       hydrOXYzine (ATARAX) tablet 25 mg  25 mg Oral TID PRN [name], DO   25 mg at 03/06/23 1816   LORazepam (ATIVAN) tablet 2 mg  2 mg Oral TID PRN [name], NP   2 mg at 01/15/23 1211   Or   LORazepam (ATIVAN) injection 2 mg  2 mg Intramuscular TID PRN [name], NP       magnesium hydroxide (MILK OF MAGNESIA) suspension 30 mL  30 mL Oral Daily PRN [name], NP   30 mL at 02/26/23 1828   melatonin tablet 5 mg  5 mg Oral QHS Nkwenti, Doris, NP   5 mg at 03/06/23 2116   methylphenidate (RITALIN) tablet 5 mg  5 mg Oral Daily Massengill, Nathan, MD   5 mg at 03/07/23 5956   nicotine (NICODERM CQ - dosed in mg/24 hours) patch 21 mg  21 mg Transdermal Daily [name], DO   21 mg at 03/06/23 0819   paliperidone (INVEGA) 24 hr tablet 6 mg  6 mg Oral Daily [name], NP   6 mg at 03/06/23 2116   pantoprazole (PROTONIX) EC tablet 40 mg  40 mg Oral Daily [name], NP   40 mg at  03/07/23 3875   polyethylene glycol (MIRALAX / GLYCOLAX) packet 17 g  17 g Oral Daily PRN [name], NP   17 g at 03/01/23 1352   sertraline (ZOLOFT) tablet 150 mg  150 mg Oral Daily Massengill, Harrold Donath, MD   150 mg at 03/07/23 6433   traZODone (DESYREL) tablet 50 mg  50 mg Oral QHS [name], NP   50 mg at 03/06/23 2116   Vitamin D (Ergocalciferol) (DRISDOL) 1.25 MG (50000 UNIT) capsule 50,000 Units  50,000 Units Oral Q7 days [name], NP   50,000 Units at 03/02/23 1607     Discharge Medications: Please see discharge summary for a list of discharge medications.  Relevant Imaging Results:  Relevant Lab Results:   Additional Information    Lottie Sigman S Karoline Fleer, LCSW

## 2023-03-07 NOTE — Group Note (Signed)
LCSW Group Therapy Note   Group Date: 03/07/2023 Start Time: 1100 End Time: 1200   Type of Therapy and Topic: Group Therapy: Relationship Check-Ins   Participation Level: Did Not Attend   Description Group:  In this group patients are encouraged to rate how well or not well they are able to improve their relationships in Beliefs and Values, Communication, Family and Friends, Actuary and Household, and Intimacy. Patients will be able to discuss and identify what is going well within these aspects and what is not. Patients will be able to find appropriate ways, solutions, and skills that will help them within the selected relationships category. Patients will be encouraged to share and reflect on why things within their relationships are not going so well and get feedback from the instructor or their peers.  This group will be solution focused and process-oriented with patients' participation in sharing and listening to their own and peers experience; along with receive support and advice on how to improve or change the circumstance that is known to be challenging in their relationships category (Beliefs and Values, Communication, Family and Friends, Actuary and Household, and Intimacy).   Therapeutic Goals:  Patient will identify their strengths and weakness within their Beliefs and Values, Communication, Family and Friends, Actuary and Household, and Intimacy relationships. Patient will identify reasons why and how they can improve their relationships.  Patient will identify how they can be supportive and honest to themselves and in their relationships.  Patient will be able to gain support and give support to others with similar challenges.   Summary of Patient Progress Pt did not attend group   Therapeutic Modalities:  Solution Focused Therapy Cognitive Behavioral Therapy  Psychodynamic Therapy  Dialectical Behavior Therapy   Jacob Moores , LCSWA  Izell ,  LCSW 03/07/2023  12:16 PM
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
## 2023-03-07 NOTE — Plan of Care (Signed)
  Problem: Activity: Goal: Sleeping patterns will improve Outcome: Progressing   Problem: Health Behavior/Discharge Planning: Goal: Compliance with treatment plan for underlying cause of condition will improve Outcome: Progressing   Problem: Safety: Goal: Periods of time without injury will increase Outcome: Progressing   Problem: Education: Goal: Emotional status will improve Outcome: Progressing

## 2023-03-08 ENCOUNTER — Encounter (HOSPITAL_COMMUNITY): Payer: Self-pay

## 2023-03-08 DIAGNOSIS — F251 Schizoaffective disorder, depressive type: Secondary | ICD-10-CM | POA: Diagnosis not present

## 2023-03-08 NOTE — Progress Notes (Signed)
   03/08/23 0557  15 Minute Checks  Location Bedroom  Visual Appearance Calm  Behavior Sleeping  Sleep (Behavioral Health Patients Only)  Calculate sleep? (Click Yes once per 24 hr at 0600 safety check) Yes  Documented sleep last 24 hours 9.25

## 2023-03-08 NOTE — Plan of Care (Signed)
  Problem: Activity: Goal: Interest or engagement in leisure activities will improve Outcome: Progressing   Problem: Coping: Goal: Will verbalize feelings Outcome: Progressing   Problem: Self-Concept: Goal: Level of anxiety will decrease Outcome: Progressing   Problem: Safety: Goal: Periods of time without injury will increase Outcome: Progressing

## 2023-03-08 NOTE — Progress Notes (Signed)
   03/07/23 2100  Psych Admission Type (Psych Patients Only)  Admission Status Involuntary  Psychosocial Assessment  Patient Complaints None  Eye Contact Fair  Facial Expression Animated  Affect Appropriate to circumstance  Speech Logical/coherent  Interaction Childlike  Motor Activity Slow  Appearance/Hygiene Disheveled  Behavior Characteristics Cooperative  Mood Pleasant  Thought Process  Coherency WDL  Content WDL  Delusions None reported or observed  Perception WDL  Hallucination None reported or observed  Judgment Poor  Confusion None  Danger to Self  Current suicidal ideation? Denies  Agreement Not to Harm Self Yes  Description of Agreement verbal  Danger to Others  Danger to Others None reported or observed   Pt was offered support and encouragement. Pt was given scheduled medications. Q 15 minute checks were done for safety. Pt attended group and interacts well with peers and staff. Pt has no complaints.Pt receptive to treatment and safety maintained on unit.

## 2023-03-08 NOTE — Progress Notes (Signed)
[facility] MD Progress Note  03/08/2023 4:11 PM Yolanda [name]  MRN:  [id number]  Principal Problem: Schizoaffective disorder, depressive type (HCC)  Diagnosis: Principal Problem:   Schizoaffective disorder, depressive type (HCC) Active Problems:   GERD (gastroesophageal reflux disease)   Intellectual disability   Tobacco use disorder  Reason for admission: This is the first psychiatric admission in this [facility] in 12 years for this 47 AA female with an extensive hx of mental illnesses & probable polysubstance use disorders. She is admitted to the [facility] from the [facility] hospital with complain of worsening suicidal ideations with plan to stab herself. Per chart review, patient apparently reported at the ED that she has been depressed for a while & has not been taking her mental health medications. After medical evaluation.clearance, she was transferred to the [facility] for further psychiatric evaluation/treatments.   24 hr chart review: Sleep Hours last night: 8 hours. Nursing Concerns: Self-isolation at times, requires encouragement to attend  group sessions sometimes. Behavioral episodes in the past 24 hrs: None reported or documented Medication Compliance: Compliant  Vital Signs in the past 24 hrs: Pulse rate fluctuates. Patient is in no apparent distress.  PRN Medications in the past 24 hrs: Hydroxyzine on a prn basis.  Patient assessment note:Yolanda [name] is seen in her room this morning. She was coming out of the bathroom after taking a shower. She presents alert, oriented & aware of situation. She is making a good eye contact, verbally responsive & inquisitive this morning. She reports, "I'm doing okay, but why am I on Ritalin? The nurse told me to ask you why you put me on Ritalin. Why should I be on Ritalin? Makinna is explained that the Ritalin to help her stay mentally alert during the day. This way she will stay physically agile without feeling too tired or fatigue to participate in the daily group  sessions/activities. Patient later expressed her enthusiasm in knowing that her DSS case in helping her find a residence is now in the hands of the right people that should be able to find a place to stay. She currently denies any SIHI, AVH, delusional thoughts or paranoia. She does not appear to be responding to any internal stimuli. She is taking & tolerating her treatment regimen. Denies any side effects. There are no changes made on her current plan of care. Will continue as already in progress. Vital signs remain stable.  A recent meeting held with the attending psychiatrist, CSW & a DSS workers present. The meeting was discuss the discuss current disposition of pt's potential placement . Partner's representatives requested an FL2 and to be completed by CSW and MD. (Awaiting MD completion) . DSS will reach out to Medicaid to change plan from [facility] to [facility] and request a tailored Medicaid plan. Ms. [name] will also start a petition for Guardianship. Ms Corey Skains will continue to search for appropriate placement simultaneously. CSW Will continue to monitor.   Discussed the following psychosocial stressors: Continuous hospitalization being addressed with CSW.    We continue to await DSS to coordinate with pt's treatment team for placement in a living arrangement outside of this hospital that would be beneficial for her.  It is preferable for pt to be assigned a legal guardian by DSS. CSW is in discussions with DSS regarding this.  No TD/EPS type symptoms found on assessment, and pt denies any feelings of stiffness. AIMS: 0.  Continuing all meds as below with no changes today, and continuing to monitor.  Pt is being given positive reinforcements to get out of her room for unit group activities and to tend to personal hygiene needs.   Total Time spent with patient:  45 minutes  Past Psychiatric History: See H & P  Past Medical History:  Past Medical History:  Diagnosis Date    Bipolar affect, depressed (HCC)    Constipation 08/17/2022   Depression    Falls 07/21/2022   Fracture of femoral neck, right, closed (HCC) 01/17/2022   Herpes simplex 08/22/2017   Open fracture dislocation of right elbow joint 01/17/2022    Past Surgical History:  Procedure Laterality Date   NO PAST SURGERIES     SALPINGECTOMY     Family History: History reviewed. No pertinent family history.  Family Psychiatric  History: See H & P  Social History:  Social History   Substance and Sexual Activity  Alcohol Use Yes     Social History   Substance and Sexual Activity  Drug Use Yes   Types: Cocaine, Marijuana    Social History   Socioeconomic History   Marital status: Single    Spouse name: Not on file   Number of children: Not on file   Years of education: Not on file   Highest education level: Not on file  Occupational History   Not on file  Tobacco Use   Smoking status: Every Day   Smokeless tobacco: Not on file  Substance and Sexual Activity   Alcohol use: Yes   Drug use: Yes    Types: Cocaine, Marijuana   Sexual activity: Yes  Other Topics Concern   Not on file  Social History Narrative   Not on file   Social Determinants of Health   Financial Resource Strain: Low Risk  (09/12/2022)   Received from [facility]   Overall Financial Resource Strain (CARDIA)    Difficulty of Paying Living Expenses: Not hard at all  Food Insecurity: Patient Declined (12/20/2022)   Hunger Vital Sign    Worried About Running Out of Food in the Last Year: Patient declined    Ran Out of Food in the Last Year: Patient declined  Transportation Needs: No Transportation Needs (12/20/2022)   PRAPARE - Administrator, Civil Service (Medical): No    Lack of Transportation (Non-Medical): No  Physical Activity: Not on file  Stress: No Stress Concern Present (07/17/2022)   Received from [facility] -  Occupational Stress Questionnaire    Feeling of Stress : Not at all  Social Connections: Unknown (07/16/2022)   Received from [facility], [facility]   Social Network    Social Network: Not on file   Sleep: Good  Appetite:  Good  Current Medications: Current Facility-Administered Medications  Medication Dose Route Frequency Provider Last Rate Last Admin   acetaminophen (TYLENOL) tablet 650 mg  650 mg Oral Q6H PRN [name], NP   650 mg at 02/25/23 2100   alum & mag hydroxide-simeth (MAALOX/MYLANTA) 200-200-20 MG/5ML suspension 30 mL  30 mL Oral Q4H PRN [name], NP   30 mL at 01/28/23 1530   busPIRone (BUSPAR) tablet 15 mg  15 mg Oral BID [name], NP   15 mg at 03/08/23 0929   cyanocobalamin (VITAMIN B12) injection 1,000 mcg  1,000 mcg Intramuscular Q30 days Sarita Bottom, MD   1,000 mcg at 03/04/23 1421   diphenhydrAMINE (BENADRYL) capsule 50 mg  50 mg Oral TID  PRN [name], NP   50 mg at 01/15/23 1211   Or   diphenhydrAMINE (BENADRYL) injection 50 mg  50 mg Intramuscular TID PRN [name], NP       feeding supplement (ENSURE ENLIVE / ENSURE PLUS) liquid 237 mL  237 mL Oral TID BM Nkwenti, Doris, NP   237 mL at 03/08/23 1440   haloperidol (HALDOL) tablet 5 mg  5 mg Oral TID PRN [name], NP   5 mg at 01/15/23 1211   Or   haloperidol lactate (HALDOL) injection 5 mg  5 mg Intramuscular TID PRN [name], NP       hydrOXYzine (ATARAX) tablet 25 mg  25 mg Oral TID PRN [name], DO   25 mg at 03/06/23 1816   LORazepam (ATIVAN) tablet 2 mg  2 mg Oral TID PRN [name], NP   2 mg at 01/15/23 1211   Or   LORazepam (ATIVAN) injection 2 mg  2 mg Intramuscular TID PRN [name], NP       magnesium hydroxide (MILK OF MAGNESIA) suspension 30 mL  30 mL Oral Daily PRN [name], NP   30 mL at 02/26/23 1828   melatonin tablet 5 mg  5 mg Oral QHS Nkwenti, Doris, NP   5 mg at 03/07/23 2104   methylphenidate (RITALIN) tablet 5 mg  5 mg Oral Daily  Massengill, Nathan, MD   5 mg at 03/07/23 8119   nicotine (NICODERM CQ - dosed in mg/24 hours) patch 21 mg  21 mg Transdermal Daily [name], DO   21 mg at 03/06/23 0819   paliperidone (INVEGA) 24 hr tablet 6 mg  6 mg Oral Daily [name], NP   6 mg at 03/07/23 2104   pantoprazole (PROTONIX) EC tablet 40 mg  40 mg Oral Daily [name], NP   40 mg at 03/08/23 1478   polyethylene glycol (MIRALAX / GLYCOLAX) packet 17 g  17 g Oral Daily PRN [name], NP   17 g at 03/01/23 1352   sertraline (ZOLOFT) tablet 150 mg  150 mg Oral Daily Massengill, Harrold Donath, MD   150 mg at 03/08/23 2956   traZODone (DESYREL) tablet 50 mg  50 mg Oral QHS [name], NP   50 mg at 03/07/23 2104   Vitamin D (Ergocalciferol) (DRISDOL) 1.25 MG (50000 UNIT) capsule 50,000 Units  50,000 Units Oral Q7 days [name], NP   50,000 Units at 03/02/23 1607   Lab Results: No results found for this or any previous visit (from the past 48 hour(s)).  Blood Alcohol level:  Lab Results  Component Value Date   ETH <10 12/19/2022   ETH <10 11/21/2019   Metabolic Disorder Labs: Lab Results  Component Value Date   HGBA1C 4.4 (L) 12/21/2022   MPG 79.58 12/21/2022   No results found for: "PROLACTIN" Lab Results  Component Value Date   CHOL 218 (H) 12/21/2022   TRIG 81 12/21/2022   HDL 53 12/21/2022   CHOLHDL 4.1 12/21/2022   VLDL 16 12/21/2022   LDLCALC 149 (H) 12/21/2022   LDLCALC 110 (H) 03/13/2011   Physical Findings: AIMS: Facial and Oral Movements Muscles of Facial Expression: None, normal Lips and Perioral Area: None, normal Jaw: None, normal Tongue: None, normal,Extremity Movements Upper (arms, wrists, hands, fingers): None, normal Lower (legs, knees, ankles, toes): None, normal, Trunk Movements Neck, shoulders, hips: None, normal, Overall Severity Severity of abnormal movements (highest score from questions above): None, normal Incapacitation  due to abnormal movements: None,  normal Patient's awareness of abnormal movements (rate only patient's report): No Awareness, Dental Status Current problems with teeth and/or dentures?: No Does patient usually wear dentures?: No  CIWA:    COWS:    AIMS:0 Musculoskeletal: Strength & Muscle Tone: within normal limits Gait & Station: normal Patient leans: N/A  Psychiatric Specialty Exam:  Presentation  General Appearance:  Appropriate for Environment; Casual; Fairly Groomed  Eye Contact: Fair  Speech: Clear and Coherent; Normal Rate  Speech Volume: Normal  Handedness: Right  Mood and Affect  Mood: -- (Stabilizing on daily basis.)  Affect: Congruent  Thought Process  Thought Processes: Coherent; Goal Directed  Descriptions of Associations:Intact  Orientation:Full (Time, Place and Person)  Thought Content:Logical  History of Schizophrenia/Schizoaffective disorder:Yes  Duration of Psychotic Symptoms:Greater than six months  Hallucinations:Hallucinations: None Description of Auditory Hallucinations: NA   Ideas of Reference:None  Suicidal Thoughts:Suicidal Thoughts: No SI Active Intent and/or Plan: Without Intent; Without Means to Carry Out; Without Plan; Without Access to Means SI Passive Intent and/or Plan: Without Intent; Without Plan; Without Means to Carry Out; Without Access to Means   Homicidal Thoughts:Homicidal Thoughts: No   Sensorium  Memory: Immediate Good; Recent Good; Remote Good  Judgment: -- (Limited)  Insight: -- (Limited)  Executive Functions  Concentration: Fair  Attention Span: Fair  Recall: [employer] of Knowledge: -- (Limited)  Language: Fair  Psychomotor Activity  Psychomotor Activity: Psychomotor Activity: Normal   Assets  Assets: Communication Skills; Desire for Improvement; Financial Resources/Insurance; Resilience; Social Support  Sleep  Sleep: Sleep: Good Number of Hours of Sleep: 8  Physical Exam: Physical Exam Vitals and  nursing note reviewed.  Constitutional:      Appearance: Normal appearance.  HENT:     Nose: Nose normal.  Eyes:     Pupils: Pupils are equal, round, and reactive to light.  Pulmonary:     Effort: Pulmonary effort is normal.  Genitourinary:    Comments: Deferred Musculoskeletal:        General: Normal range of motion.     Cervical back: Normal range of motion.  Neurological:     Mental Status: She is alert and oriented to person, place, and time.    Review of Systems  Constitutional:  Negative for fever.  HENT:  Negative for hearing loss.   Eyes:  Negative for blurred vision.  Respiratory:  Negative for cough.   Cardiovascular:  Negative for chest pain.  Gastrointestinal:  Negative for heartburn.  Genitourinary:  Negative for dysuria.  Musculoskeletal:  Negative for myalgias.  Skin:  Negative for rash.  Neurological:  Negative for dizziness.  Psychiatric/Behavioral:  Positive for depression. Negative for hallucinations, memory loss, substance abuse and suicidal ideas. The patient is nervous/anxious and has insomnia.    Blood pressure 95/68, pulse 76, temperature 98.3 F (36.8 C), temperature source Oral, resp. rate 16, height 4\' 11"  (1.499 m), weight 63 kg, SpO2 98%. Body mass index is 28.05 kg/m.  Treatment Plan Summary: Daily contact with patient to assess and evaluate symptoms and progress in treatment and Medication management.    Still waiting on safe disposition as patient would not be able to live independently given cognitive impairment. Recommend assisted living.    No medication side effects reported.  Because of urinary incontinence, recommended adult diapers to be worn.   Principal/active diagnoses:  Schizoaffective disorder, depressive type (HCC)  Active Problems: GERD (gastroesophageal reflux disease) Intellectual disability Tobacco use disorder (MOCA 16/30) moderate cognitive impairment  probably related to moderate intellectual disability.  Plan:   -Continue Ritalin 5 mg daily to help with energy & wakefulness -Continue Vitamin D 50.000 units weekly for bone health. -Continue Sertraline150 mg po Q daily for depression/anxiety -Continue Buspar 15 mg po bid for anxiety.  -Continue paliperidone 6 mg po qd for mood stabilization -Continue Trazodone 50 mg nightly for insomnia  -Continue Nicoderm 21 mg topically Q 24 hrs for nicotine withdrawal management. -Continue vitamin B12 1000 mcg IM weekly for 4 weeks then monthly -Continue Melatonin 5 mg nightly for sleep -Continue Protonix EC 40 mg p.o. daily for GERD -Continue MiraLAX 17 g p.o. PRN for constipation -Continue hydroxyzine 25 mg p.o. 3 times daily as needed for anxiety -Continue Ensure nutritional shakes TID in between meals  -Previously discontinued Hydroxyzine 50 mg nightly-hypotension in the mornings   Safety and Monitoring: Voluntary admission to inpatient psychiatric unit for safety, stabilization and treatment Daily contact with patient to assess and evaluate symptoms and progress in treatment Patient's case to be discussed in multi-disciplinary team meeting Observation Level : q15 minute checks Vital signs: q12 hours Precautions: Safety   Discharge Planning: Social work and case management to assist with discharge planning and identification of hospital follow-up needs prior to discharge Estimated LOS: Unknown at this time. Discharge Concerns: Need to establish a safety plan; Medication compliance and effectiveness Discharge Goals: Return home with outpatient referrals for mental health follow-up including medication management/psychotherapy No legal guardian, has payee  Armandina Stammer, NP, pmhnp 03/08/2023, 4:11 PM Patient ID: Yolanda [name], female   DOB: 12-Jan-1976, 47 y.o.   MRN: [id number] Patient ID: BRAXTON SLEDGE, female   DOB: 1976-06-27, 47 y.o.   MRN: [id number] Patient ID: NAZARIAH GRACIE, female   DOB: 1976-06-28, 47 y.o.   MRN: [id number] Patient ID:  CHESNA STAPLES, female   DOB: 04-Nov-1975, 47 y.o.   MRN: [id number]

## 2023-03-08 NOTE — BH IP Treatment Plan (Signed)
Interdisciplinary Treatment and Diagnostic Plan Update  03/08/2023 Time of Session: 9:30 AM ( UPDATE ) Yolanda [name] MRN: [id number]  Principal Diagnosis: Schizoaffective disorder, depressive type (HCC)  Secondary Diagnoses: Principal Problem:   Schizoaffective disorder, depressive type (HCC) Active Problems:   GERD (gastroesophageal reflux disease)   Intellectual disability   Tobacco use disorder   Current Medications:  Current Facility-Administered Medications  Medication Dose Route Frequency Provider Last Rate Last Admin   acetaminophen (TYLENOL) tablet 650 mg  650 mg Oral Q6H PRN [name], NP   650 mg at 02/25/23 2100   alum & mag hydroxide-simeth (MAALOX/MYLANTA) 200-200-20 MG/5ML suspension 30 mL  30 mL Oral Q4H PRN [name], NP   30 mL at 01/28/23 1530   busPIRone (BUSPAR) tablet 15 mg  15 mg Oral BID [name], NP   15 mg at 03/07/23 1715   cyanocobalamin (VITAMIN B12) injection 1,000 mcg  1,000 mcg Intramuscular Q30 days [name], Nadir, MD   1,000 mcg at 03/04/23 1421   diphenhydrAMINE (BENADRYL) capsule 50 mg  50 mg Oral TID PRN [name], NP   50 mg at 01/15/23 1211   Or   diphenhydrAMINE (BENADRYL) injection 50 mg  50 mg Intramuscular TID PRN [name], NP       feeding supplement (ENSURE ENLIVE / ENSURE PLUS) liquid 237 mL  237 mL Oral TID BM Nkwenti, Doris, NP   237 mL at 03/07/23 2104   haloperidol (HALDOL) tablet 5 mg  5 mg Oral TID PRN [name], NP   5 mg at 01/15/23 1211   Or   haloperidol lactate (HALDOL) injection 5 mg  5 mg Intramuscular TID PRN [name], NP       hydrOXYzine (ATARAX) tablet 25 mg  25 mg Oral TID PRN [name], DO   25 mg at 03/06/23 1816   LORazepam (ATIVAN) tablet 2 mg  2 mg Oral TID PRN [name], NP   2 mg at 01/15/23 1211   Or   LORazepam (ATIVAN) injection 2 mg  2 mg Intramuscular TID PRN [name], NP       magnesium hydroxide (MILK OF MAGNESIA) suspension 30 mL  30 mL Oral Daily PRN  [name], NP   30 mL at 02/26/23 1828   melatonin tablet 5 mg  5 mg Oral QHS Nkwenti, Doris, NP   5 mg at 03/07/23 2104   methylphenidate (RITALIN) tablet 5 mg  5 mg Oral Daily Massengill, Nathan, MD   5 mg at 03/07/23 4098   nicotine (NICODERM CQ - dosed in mg/24 hours) patch 21 mg  21 mg Transdermal Daily [name], DO   21 mg at 03/06/23 0819   paliperidone (INVEGA) 24 hr tablet 6 mg  6 mg Oral Daily [name], NP   6 mg at 03/07/23 2104   pantoprazole (PROTONIX) EC tablet 40 mg  40 mg Oral Daily [name], NP   40 mg at 03/07/23 0826   polyethylene glycol (MIRALAX / GLYCOLAX) packet 17 g  17 g Oral Daily PRN [name], NP   17 g at 03/01/23 1352   sertraline (ZOLOFT) tablet 150 mg  150 mg Oral Daily Massengill, Harrold Donath, MD   150 mg at 03/07/23 0826   traZODone (DESYREL) tablet 50 mg  50 mg Oral QHS [name], NP   50 mg at 03/07/23 2104   Vitamin D (Ergocalciferol) (DRISDOL) 1.25 MG (50000 UNIT) capsule 50,000 Units  50,000 Units Oral Q7  days [name], NP   50,000 Units at 03/02/23 1607   PTA Medications: Medications Prior to Admission  Medication Sig Dispense Refill Last Dose   busPIRone (BUSPAR) 15 MG tablet Take 15 mg by mouth 2 (two) times daily. (Patient not taking: Reported on 12/19/2022)      paliperidone (INVEGA SUSTENNA) 156 MG/ML SUSY injection Inject 156 mg into the muscle once. (Patient not taking: Reported on 12/19/2022)      sertraline (ZOLOFT) 50 MG tablet Take 150 mg by mouth daily. (Patient not taking: Reported on 12/19/2022)      traZODone (DESYREL) 100 MG tablet Take 100 mg by mouth at bedtime as needed for sleep. (Patient not taking: Reported on 11/12/2022)       Patient Stressors: Medication change or noncompliance    Patient Strengths: Forensic psychologist fund of knowledge   Treatment Modalities: Medication Management, Group therapy, Case management,  1 to 1 session with clinician, Psychoeducation, Recreational  therapy.   Physician Treatment Plan for Primary Diagnosis: Schizoaffective disorder, depressive type (HCC) Long Term Goal(s): Improvement in symptoms so as ready for discharge   Short Term Goals: Ability to identify and develop effective coping behaviors will improve Ability to maintain clinical measurements within normal limits will improve Compliance with prescribed medications will improve Ability to identify triggers associated with substance abuse/mental health issues will improve Ability to identify changes in lifestyle to reduce recurrence of condition will improve Ability to verbalize feelings will improve Ability to disclose and discuss suicidal ideas Ability to demonstrate self-control will improve  Medication Management: Evaluate patient's response, side effects, and tolerance of medication regimen.  Therapeutic Interventions: 1 to 1 sessions, Unit Group sessions and Medication administration.  Evaluation of Outcomes: Progressing  Physician Treatment Plan for Secondary Diagnosis: Principal Problem:   Schizoaffective disorder, depressive type (HCC) Active Problems:   GERD (gastroesophageal reflux disease)   Intellectual disability   Tobacco use disorder  Long Term Goal(s): Improvement in symptoms so as ready for discharge   Short Term Goals: Ability to identify and develop effective coping behaviors will improve Ability to maintain clinical measurements within normal limits will improve Compliance with prescribed medications will improve Ability to identify triggers associated with substance abuse/mental health issues will improve Ability to identify changes in lifestyle to reduce recurrence of condition will improve Ability to verbalize feelings will improve Ability to disclose and discuss suicidal ideas Ability to demonstrate self-control will improve     Medication Management: Evaluate patient's response, side effects, and tolerance of medication  regimen.  Therapeutic Interventions: 1 to 1 sessions, Unit Group sessions and Medication administration.  Evaluation of Outcomes: Progressing   RN Treatment Plan for Primary Diagnosis: Schizoaffective disorder, depressive type (HCC) Long Term Goal(s): Knowledge of disease and therapeutic regimen to maintain health will improve  Short Term Goals: Ability to remain free from injury will improve, Ability to verbalize frustration and anger appropriately will improve, Ability to participate in decision making will improve, Ability to verbalize feelings will improve, Ability to identify and develop effective coping behaviors will improve, and Compliance with prescribed medications will improve  Medication Management: RN will administer medications as ordered by provider, will assess and evaluate patient's response and provide education to patient for prescribed medication. RN will report any adverse and/or side effects to prescribing provider.  Therapeutic Interventions: 1 on 1 counseling sessions, Psychoeducation, Medication administration, Evaluate responses to treatment, Monitor vital signs and CBGs as ordered, Perform/monitor CIWA, COWS, AIMS and Fall Risk screenings as ordered, Perform  wound care treatments as ordered.  Evaluation of Outcomes: Progressing   LCSW Treatment Plan for Primary Diagnosis: Schizoaffective disorder, depressive type (HCC) Long Term Goal(s): Safe transition to appropriate next level of care at discharge, Engage patient in therapeutic group addressing interpersonal concerns.  Short Term Goals: Engage patient in aftercare planning with referrals and resources, Increase social support, Increase emotional regulation, Facilitate acceptance of mental health diagnosis and concerns, Identify triggers associated with mental health/substance abuse issues, and Increase skills for wellness and recovery  Therapeutic Interventions: Assess for all discharge needs, 1 to 1 time with  Social worker, Explore available resources and support systems, Assess for adequacy in community support network, Educate family and significant other(s) on suicide prevention, Complete Psychosocial Assessment, Interpersonal group therapy.  Evaluation of Outcomes: Progressing   Progress in Treatment:  Attending groups: Yes. Participating in groups: Yes. Taking medication as prescribed: Yes. Toleration medication: Yes. Family/Significant other contact made: Yes, individual(s) contacted:  CSW has been in contact with DSS  Patient understands diagnosis: Yes. Discussing patient identified problems/goals with staff: Yes. Medical problems stabilized or resolved: Yes. Denies suicidal/homicidal ideation: Yes. Issues/concerns per patient self-inventory: No.   New problem(s) identified: No, Describe:  none reported    New Short Term/Long Term Goal(s): medication stabilization, elimination of SI thoughts, development of comprehensive mental wellness plan.    Patient Goals:  coping skills   Discharge Plan or Barriers: Patient recently admitted. CSW will continue to follow and assess for appropriate referrals and possible discharge planning.      Reason for Continuation of Hospitalization: Medication stabilization   Estimated Length of Stay: Pt is still waiting for placement    Last 3 [place] Suicide Severity Risk Score: Flowsheet Row Admission (Current) [facility] from [facility]19/2022 in [facility] from 11/12/2022 in [facility] Department at [facility]  C-SSRS RISK CATEGORY High Risk High Risk Moderate Risk       Last [facility] 2/9 Scores:     No data to display          Scribe for Treatment Team: Beather Arbour 03/08/2023 8:35 AM

## 2023-03-08 NOTE — B[facility] Group Notes (Signed)
Adult Psychoeducational Group Note  Date:  03/08/2023 Time:  9:24 PM  Group Topic/Focus:  Wrap-Up Group:   The focus of this group is to help patients review their daily goal of treatment and discuss progress on daily workbooks.  Participation Level:  Active  Participation Quality:  Appropriate  Affect:  Appropriate  Cognitive:  Appropriate  Insight: Appropriate  Engagement in Group:  Engaged  Modes of Intervention:  Discussion  Additional Comments:  Pt attended group, stated day for a 6.  Joselyn Arrow 03/08/2023, 9:24 PM

## 2023-03-08 NOTE — Group Note (Signed)
Recreation Therapy Group Note   Group Topic:Team Building  Group Date: 03/08/2023 Start Time: 0930 End Time: 1000 Facilitators: Duaine Radin-McCall, LRT,CTRS Location: [address] Dayroom   Goal Area(s) Addresses:  Patient will effectively work with peer towards shared goal.  Patient will identify skills used to make activity successful.  Patient will identify how skills used during activity can be applied to reach post d/c goals.   Group Description: [employer]. In teams of 5-6, patients were given 11 craft pipe cleaners. Using the materials provided, patients were instructed to compete again the opposing team(s) to build the tallest free-standing structure from floor level. The activity was timed; difficulty increased by Clinical research associate as Production designer, theatre/television/film continued.  Systematically resources were removed with additional directions for example, placing one arm behind their back, working in silence, and shape stipulations. LRT facilitated post-activity discussion reviewing team processes and necessary communication skills involved in completion. Patients were encouraged to reflect how the skills utilized, or not utilized, in this activity can be incorporated to positively impact support systems post discharge.   Clinical Observations/Individualized Feedback: Group did not occur due to previous group going over into group time.     Plan: Continue to engage patient in RT group sessions 2-3x/week.   Balian Schaller-McCall, LRT,CTRS 03/08/2023 11:49 AM

## 2023-03-08 NOTE — B[facility] Group Notes (Signed)
Adult Psychoeducational Group Note  Date:  03/08/2023 Time:  11:06 AM  Group Topic/Focus:  Goals Group:   The focus of this group is to help patients establish daily goals to achieve during treatment and discuss how the patient can incorporate goal setting into their daily lives to aide in recovery.  Participation Level:  Did Not Attend  Participation Quality:    Affect:    Cognitive:    Insight:   Engagement in Group:    Modes of Intervention:    Additional Comments:  Pt did not attend group   [employer] 03/08/2023, 11:06 AM

## 2023-03-08 NOTE — Progress Notes (Signed)
   03/08/23 0929  Psych Admission Type (Psych Patients Only)  Admission Status Involuntary  Psychosocial Assessment  Patient Complaints Irritability  Eye Contact Fair  Facial Expression Flat  Affect Flat  Speech Logical/coherent  Interaction Childlike  Motor Activity Slow  Appearance/Hygiene Disheveled  Behavior Characteristics Irritable  Mood Irritable  Thought Process  Coherency WDL  Content WDL  Delusions None reported or observed  Perception WDL  Hallucination None reported or observed  Judgment Poor  Confusion None  Danger to Self  Current suicidal ideation? Denies  Agreement Not to Harm Self Yes  Description of Agreement verbal  Danger to Others  Danger to Others None reported or observed

## 2023-03-09 DIAGNOSIS — F251 Schizoaffective disorder, depressive type: Secondary | ICD-10-CM | POA: Diagnosis not present

## 2023-03-09 NOTE — Progress Notes (Signed)
[facility] MD Progress Note  03/09/2023 1:23 PM Yolanda [name]  MRN:  [id number]  Principal Problem: Schizoaffective disorder, depressive type (HCC)  Diagnosis: Principal Problem:   Schizoaffective disorder, depressive type (HCC) Active Problems:   GERD (gastroesophageal reflux disease)   Intellectual disability   Tobacco use disorder  Reason for admission: This is the first psychiatric admission in this [facility] in 12 years for this 47 AA female with an extensive hx of mental illnesses & probable polysubstance use disorders. She is admitted to the [facility] from the [facility] hospital with complain of worsening suicidal ideations with plan to stab herself. Per chart review, patient apparently reported at the ED that she has been depressed for a while & has not been taking her mental health medications. After medical evaluation.clearance, she was transferred to the [facility] for further psychiatric evaluation/treatments.   24 hr chart review: Sleep Hours last night: 8.5 hours. Nursing Concerns: Isolative to her room. Behavioral episodes in the past 24 hrs: As above Medication Compliance: Compliant  Vital Signs in the past 24 hrs: WNL PRN Medications in the past 24 hrs: None j  Patient assessment note: Pt with an irritable and depressed mood, attention to personal hygiene and grooming is fair, eye contact is minimal, speech remains garbled which is pt's current baseline, requiring multiple positive reinforcements for her to speak up so that speech is comprehensible. Thought contents are organized and logical, and pt currently denies SI/HI/AVH or paranoia. There is no evidence of delusional thoughts.    Pt is seen in her room for this encounter accompanied by attending Psychiatrist. She brightened up when she saw attending psychiatrist. Pt reminded of the need to get out of her bed and take a shower and brush her teeth and attend to other personal hygiene needs and go to the day room for group activities. She  reports that she slept well last night, reports a good appetite, denies physical pain, states that she did not eat breakfast today morning because she is tired of eating bacon and grits.   We continue to await DSS to coordinate with pt's treatment team for placement in a living arrangement outside of this hospital that would be beneficial for her.  It is preferable for pt to be assigned a legal guardian by DSS. CSW is in discussions with DSS regarding this.  No TD/EPS type symptoms found on assessment, and pt denies any feelings of stiffness. AIMS: 0. Discussed the following psychosocial stressors: Continuous hospitalization being addressed with CSW.   Continuing all meds as below with no changes today, and continuing to monitor.   As per NP's note on 8/30: A recent meeting held with the attending psychiatrist, CSW & a DSS workers present. The meeting was discuss the discuss current disposition of pt's potential placement . Partner's representatives requested an FL2 and to be completed by CSW and MD. (Awaiting MD completion) . DSS will reach out to Medicaid to change plan from [facility] to [facility] and request a tailored Medicaid plan. Ms. [name] will also start a petition for Guardianship. Ms Corey Skains will continue to search for appropriate placement simultaneously. CSW Will continue to monitor.   Total Time spent with patient:  45 minutes  Past Psychiatric History: See H & P  Past Medical History:  Past Medical History:  Diagnosis Date   Bipolar affect, depressed (HCC)    Constipation 08/17/2022   Depression    Falls 07/21/2022   Fracture of femoral neck, right, closed (HCC)  01/17/2022   Herpes simplex 08/22/2017   Open fracture dislocation of right elbow joint 01/17/2022    Past Surgical History:  Procedure Laterality Date   NO PAST SURGERIES     SALPINGECTOMY     Family History: History reviewed. No pertinent family history.  Family Psychiatric  History: See H &  P  Social History:  Social History   Substance and Sexual Activity  Alcohol Use Yes     Social History   Substance and Sexual Activity  Drug Use Yes   Types: Cocaine, Marijuana    Social History   Socioeconomic History   Marital status: Single    Spouse name: Not on file   Number of children: Not on file   Years of education: Not on file   Highest education level: Not on file  Occupational History   Not on file  Tobacco Use   Smoking status: Every Day   Smokeless tobacco: Not on file  Substance and Sexual Activity   Alcohol use: Yes   Drug use: Yes    Types: Cocaine, Marijuana   Sexual activity: Yes  Other Topics Concern   Not on file  Social History Narrative   Not on file   Social Determinants of Health   Financial Resource Strain: Low Risk  (09/12/2022)   Received from [facility], [facility]   Overall Financial Resource Strain (CARDIA)    Difficulty of Paying Living Expenses: Not hard at all  Food Insecurity: Patient Declined (12/20/2022)   Hunger Vital Sign    Worried About Running Out of Food in the Last Year: Patient declined    Ran Out of Food in the Last Year: Patient declined  Transportation Needs: No Transportation Needs (12/20/2022)   PRAPARE - Administrator, Civil Service (Medical): No    Lack of Transportation (Non-Medical): No  Physical Activity: Not on file  Stress: No Stress Concern Present (07/17/2022)   Received from [facility], [facility] of Occupational Health - Occupational Stress Questionnaire    Feeling of Stress : Not at all  Social Connections: Unknown (07/16/2022)   Received from [facility], [facility]   Social Network    Social Network: Not on file   Sleep: Good  Appetite:  Good  Current Medications: Current Facility-Administered Medications  Medication Dose Route Frequency Provider Last Rate Last Admin   acetaminophen (TYLENOL) tablet 650 mg  650 mg Oral Q6H PRN [name], NP    650 mg at 02/25/23 2100   alum & mag hydroxide-simeth (MAALOX/MYLANTA) 200-200-20 MG/5ML suspension 30 mL  30 mL Oral Q4H PRN [name], NP   30 mL at 01/28/23 1530   busPIRone (BUSPAR) tablet 15 mg  15 mg Oral BID [name], NP   15 mg at 03/09/23 3474   cyanocobalamin (VITAMIN B12) injection 1,000 mcg  1,000 mcg Intramuscular Q30 days Sarita Bottom, MD   1,000 mcg at 03/04/23 1421   diphenhydrAMINE (BENADRYL) capsule 50 mg  50 mg Oral TID PRN [name], NP   50 mg at 01/15/23 1211   Or   diphenhydrAMINE (BENADRYL) injection 50 mg  50 mg Intramuscular TID PRN [name], NP       feeding supplement (ENSURE ENLIVE / ENSURE PLUS) liquid 237 mL  237 mL Oral TID BM Linard Daft, NP   237 mL at 03/08/23 2104   haloperidol (HALDOL) tablet 5 mg  5 mg Oral TID PRN Sanjuana Kava, NP  5 mg at 01/15/23 1211   Or   haloperidol lactate (HALDOL) injection 5 mg  5 mg Intramuscular TID PRN [name], NP       hydrOXYzine (ATARAX) tablet 25 mg  25 mg Oral TID PRN [name], DO   25 mg at 03/06/23 1816   LORazepam (ATIVAN) tablet 2 mg  2 mg Oral TID PRN [name], NP   2 mg at 01/15/23 1211   Or   LORazepam (ATIVAN) injection 2 mg  2 mg Intramuscular TID PRN [name], NP       magnesium hydroxide (MILK OF MAGNESIA) suspension 30 mL  30 mL Oral Daily PRN [name], NP   30 mL at 02/26/23 1828   melatonin tablet 5 mg  5 mg Oral QHS Kacelyn Rowzee, NP   5 mg at 03/08/23 2105   methylphenidate (RITALIN) tablet 5 mg  5 mg Oral Daily Massengill, Nathan, MD   5 mg at 03/07/23 4010   nicotine (NICODERM CQ - dosed in mg/24 hours) patch 21 mg  21 mg Transdermal Daily [name], DO   21 mg at 03/06/23 2725   paliperidone (INVEGA) 24 hr tablet 6 mg  6 mg Oral Daily [name], NP   6 mg at 03/08/23 2105   pantoprazole (PROTONIX) EC tablet 40 mg  40 mg Oral Daily [name], NP   40 mg at 03/09/23 0920   polyethylene glycol (MIRALAX / GLYCOLAX) packet 17 g  17 g Oral  Daily PRN [name], NP   17 g at 03/01/23 1352   sertraline (ZOLOFT) tablet 150 mg  150 mg Oral Daily Massengill, Harrold Donath, MD   150 mg at 03/09/23 0920   traZODone (DESYREL) tablet 50 mg  50 mg Oral QHS [name], NP   50 mg at 03/08/23 2105   Vitamin D (Ergocalciferol) (DRISDOL) 1.25 MG (50000 UNIT) capsule 50,000 Units  50,000 Units Oral Q7 days [name], NP   50,000 Units at 03/02/23 1607   Lab Results: No results found for this or any previous visit (from the past 48 hour(s)).  Blood Alcohol level:  Lab Results  Component Value Date   ETH <10 12/19/2022   ETH <10 11/21/2019   Metabolic Disorder Labs: Lab Results  Component Value Date   HGBA1C 4.4 (L) 12/21/2022   MPG 79.58 12/21/2022   No results found for: "PROLACTIN" Lab Results  Component Value Date   CHOL 218 (H) 12/21/2022   TRIG 81 12/21/2022   HDL 53 12/21/2022   CHOLHDL 4.1 12/21/2022   VLDL 16 12/21/2022   LDLCALC 149 (H) 12/21/2022   LDLCALC 110 (H) 03/13/2011   Physical Findings: AIMS: Facial and Oral Movements Muscles of Facial Expression: None, normal Lips and Perioral Area: None, normal Jaw: None, normal Tongue: None, normal,Extremity Movements Upper (arms, wrists, hands, fingers): None, normal Lower (legs, knees, ankles, toes): None, normal, Trunk Movements Neck, shoulders, hips: None, normal, Overall Severity Severity of abnormal movements (highest score from questions above): None, normal Incapacitation due to abnormal movements: None, normal Patient's awareness of abnormal movements (rate only patient's report): No Awareness, Dental Status Current problems with teeth and/or dentures?: No Does patient usually wear dentures?: No  CIWA:    COWS:    AIMS:0 Musculoskeletal: Strength & Muscle Tone: within normal limits Gait & Station: normal Patient leans: N/A  Psychiatric Specialty Exam:  Presentation  General Appearance:  Casual; Fairly Groomed  Eye  Contact: Fair  Speech: Garbled  Speech Volume:  Decreased  Handedness: Right  Mood and Affect  Mood: Depressed; Irritable  Affect: Congruent  Thought Process  Thought Processes: Linear  Descriptions of Associations:Intact  Orientation:Partial  Thought Content:Logical  History of Schizophrenia/Schizoaffective disorder:Yes  Duration of Psychotic Symptoms:Greater than six months  Hallucinations:Hallucinations: None    Ideas of Reference:None  Suicidal Thoughts:Suicidal Thoughts: No    Homicidal Thoughts:Homicidal Thoughts: No    Sensorium  Memory: Immediate Poor; Immediate Fair  Judgment: Poor  Insight: Poor  Executive Functions  Concentration: Poor  Attention Span: Poor  Recall: Poor  Fund of Knowledge: Poor  Language: Fair  Psychomotor Activity  Psychomotor Activity: Psychomotor Activity: Normal    Assets  Assets: Resilience  Sleep  Sleep: Sleep: Good   Physical Exam: Physical Exam Vitals and nursing note reviewed.  Constitutional:      Appearance: Normal appearance.  HENT:     Nose: Nose normal.  Eyes:     Pupils: Pupils are equal, round, and reactive to light.  Pulmonary:     Effort: Pulmonary effort is normal.  Genitourinary:    Comments: Deferred Musculoskeletal:        General: Normal range of motion.     Cervical back: Normal range of motion.  Neurological:     Mental Status: She is alert and oriented to person, place, and time.    Review of Systems  Constitutional:  Negative for fever.  HENT:  Negative for hearing loss.   Eyes:  Negative for blurred vision.  Respiratory:  Negative for cough.   Cardiovascular:  Negative for chest pain.  Gastrointestinal:  Negative for heartburn.  Genitourinary:  Negative for dysuria.  Musculoskeletal:  Negative for myalgias.  Skin:  Negative for rash.  Neurological:  Negative for dizziness.  Psychiatric/Behavioral:  Positive for depression. Negative for  hallucinations, memory loss, substance abuse and suicidal ideas. The patient is nervous/anxious and has insomnia.    Blood pressure 99/71, pulse 94, temperature 97.7 F (36.5 C), temperature source Oral, resp. rate (!) 22, height 4\' 11"  (1.499 m), weight 63 kg, SpO2 98%. Body mass index is 28.05 kg/m.  Treatment Plan Summary: Daily contact with patient to assess and evaluate symptoms and progress in treatment and Medication management.    Still waiting on safe disposition as patient would not be able to live independently given cognitive impairment. Recommend assisted living.    No medication side effects reported.  Because of urinary incontinence, recommended adult diapers to be worn.   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  Plan:  -Continue Ritalin 5 mg daily to help with energy & wakefulness -Continue Vitamin D 50.000 units weekly for bone health. -Continue Sertraline150 mg po Q daily for depression/anxiety -Continue Buspar 15 mg po bid for anxiety.  -Continue paliperidone 6 mg po qd for mood stabilization -Continue Trazodone 50 mg nightly for insomnia  -Continue Nicoderm 21 mg topically Q 24 hrs for nicotine withdrawal management. -Continue vitamin B12 1000 mcg IM weekly for 4 weeks then monthly -Continue Melatonin 5 mg nightly for sleep -Continue Protonix EC 40 mg p.o. daily for GERD -Continue MiraLAX 17 g p.o. PRN for constipation -Continue hydroxyzine 25 mg p.o. 3 times daily as needed for anxiety -Continue Ensure nutritional shakes TID in between meals  -Previously discontinued Hydroxyzine 50 mg nightly-hypotension in the mornings   Safety and Monitoring: Voluntary admission to inpatient psychiatric unit  for safety, stabilization and  treatment Daily contact with patient to assess and evaluate symptoms and progress in treatment Patient's case to be discussed in multi-disciplinary team meeting Observation Level : q15 minute checks Vital signs: q12 hours Precautions: Safety   Discharge Planning: Social work and case management to assist with discharge planning and identification of hospital follow-up needs prior to discharge Estimated LOS: Unknown at this time. Discharge Concerns: Need to establish a safety plan; Medication compliance and effectiveness Discharge Goals: Return home with outpatient referrals for mental health follow-up including medication management/psychotherapy No legal guardian, has payee  [name], NP, pmhnp 03/09/2023, 1:23 PM Patient ID: Yolanda [name], female   DOB: [birth year], 48 y.o.   MRN: [id number]

## 2023-03-09 NOTE — Progress Notes (Signed)
     03/08/23 2105  Psych Admission Type (Psych Patients Only)  Admission Status Involuntary  Psychosocial Assessment  Patient Complaints Sadness;Crying spells;Worrying  Eye Contact Fair  Facial Expression Sad  Affect Flat  Speech Logical/coherent  Interaction Childlike  Motor Activity Slow  Appearance/Hygiene Disheveled  Behavior Characteristics Cooperative  Mood Sad;Preoccupied  Thought Process  Coherency WDL  Content WDL  Delusions None reported or observed  Perception WDL  Hallucination None reported or observed  Judgment Poor  Confusion None  Danger to Self  Current suicidal ideation? Denies  Agreement Not to Harm Self Yes  Description of Agreement verbal  Danger to Others  Danger to Others None reported or observed   Pt was offered support and encouragement. Pt was given scheduled medications. Q 15 minute checks were done for safety. Pt attended group and interacts well with peers and staff. Pt has no complaints.Pt receptive to treatment and safety maintained on unit.

## 2023-03-09 NOTE — Plan of Care (Signed)
  Problem: Education: Goal: Utilization of techniques to improve thought processes will improve Outcome: Progressing Goal: Knowledge of the prescribed therapeutic regimen will improve Outcome: Progressing   Problem: Activity: Goal: Imbalance in normal sleep/wake cycle will improve Outcome: Progressing   

## 2023-03-09 NOTE — Progress Notes (Signed)
   03/09/23 1100  Psych Admission Type (Psych Patients Only)  Admission Status Involuntary  Psychosocial Assessment  Patient Complaints Depression  Eye Contact Fair  Facial Expression Sad  Affect Sad  Speech Logical/coherent  Interaction Childlike  Motor Activity Slow  Appearance/Hygiene Disheveled  Behavior Characteristics Cooperative;Appropriate to situation  Mood Sad;Preoccupied  Thought Process  Coherency WDL  Content Blaming others  Delusions None reported or observed  Perception WDL  Hallucination None reported or observed  Judgment Poor  Confusion None  Danger to Self  Current suicidal ideation? Denies  Agreement Not to Harm Self Yes  Description of Agreement agreed to contact staff before acting on hramful thoughts  Danger to Others  Danger to Others None reported or observed

## 2023-03-09 NOTE — [facility] Group Notes (Signed)
[facility] Group Notes:  (Nursing/MHT/Case Management/Adjunct)  Date:  03/09/2023  Time:  2000  Type of Therapy:   Wrap up group  Participation Level:  Active  Participation Quality:  Appropriate, Attentive, Sharing, and Supportive  Affect:  Anxious  Cognitive:  Alert  Insight:  Improving  Engagement in Group:  Engaged  Modes of Intervention:  Clarification, Education, and Support  Summary of Progress/Problems: Positive thinking and positive change were discussed.   [name] 03/09/2023, 8:58 PM

## 2023-03-09 NOTE — Plan of Care (Signed)
  Problem: Coping: Goal: Will verbalize feelings Outcome: Progressing   Problem: Medication: Goal: Compliance with prescribed medication regimen will improve Outcome: Progressing   Problem: Activity: Goal: Interest or engagement in activities will improve Outcome: Progressing   Problem: Safety: Goal: Periods of time without injury will increase Outcome: Progressing

## 2023-03-09 NOTE — Progress Notes (Signed)
   03/09/23 0559  15 Minute Checks  Location Bedroom  Visual Appearance Calm  Behavior Sleeping  Sleep (Behavioral Health Patients Only)  Calculate sleep? (Click Yes once per 24 hr at 0600 safety check) Yes  Documented sleep last 24 hours 8.5

## 2023-03-10 DIAGNOSIS — F251 Schizoaffective disorder, depressive type: Secondary | ICD-10-CM | POA: Diagnosis not present

## 2023-03-10 NOTE — [facility] Group Notes (Signed)
[facility] Group Notes:  (Nursing/MHT/Case Management/Adjunct)  Date:  03/10/2023  Time:  2000  Type of Therapy:   Wrap up group  Participation Level:  Active  Participation Quality:  Appropriate, Attentive, Sharing, and Supportive  Affect:  Anxious  Cognitive:  Alert  Insight:  Improving  Engagement in Group:  Engaged  Modes of Intervention:  Clarification, Education, and Socialization  Summary of Progress/Problems: Positive thinking and self-care were discussed.   Yolanda [name] S 03/10/2023, 9:08 PM

## 2023-03-10 NOTE — Progress Notes (Signed)
[facility] MD Progress Note  03/10/2023 1:39 PM Yolanda [name]  MRN:  [id number]  Principal Problem: Schizoaffective disorder, depressive type (HCC)  Diagnosis: Principal Problem:   Schizoaffective disorder, depressive type (HCC) Active Problems:   GERD (gastroesophageal reflux disease)   Intellectual disability   Tobacco use disorder  Reason for admission: This is the first psychiatric admission in this [facility] in 12 years for this 80 AA female with an extensive hx of mental illnesses & probable polysubstance use disorders. She is admitted to the [facility] from the [facility] hospital with complain of worsening suicidal ideations with plan to stab herself. Per chart review, patient apparently reported at the ED that she has been depressed for a while & has not been taking her mental health medications. After medical evaluation.clearance, she was transferred to the [facility] for further psychiatric evaluation/treatments.   24 hr chart review: Sleep Hours last night: Reports a good night's sleep last night Nursing Concerns:  Continues to be isolative to her room. Behavioral episodes in the past 24 hrs: As above Medication Compliance: Compliant  Vital Signs in the past 24 hrs: WNL PRN Medications in the past 24 hrs: None   Patient assessment note: Pt continues to present with an irritable & depressed mood. Her attention to personal hygiene and grooming is poor, eye contact is minimal, speech remains garbled, low, requiring multiple positive reinforcements for her to speak up to be comprehended. Thought contents are organized and logical, and pt currently denies SI/HI/AVH. Refused to answer questions regarding paranoia. There is no evidence of delusional thoughts.    She reports a goo sleep quality last night, reports a good appetite, but states that she did not go to the cafeteria earlier for breakfast, because she is tired of eating the same things over and over again. She denies being in any physical pain  today, states that last bowel movement was yesterday.  We continue to await DSS to coordinate with pt's treatment team for placement in a living arrangement outside of this hospital that would be beneficial for her.  It is preferable for pt to be assigned a legal guardian by DSS. CSW is in discussions with DSS regarding this.  No TD/EPS type symptoms found on assessment, and pt denies any feelings of stiffness. AIMS: 0. Discussed the following psychosocial stressors: Continuous hospitalization being addressed with CSW.   Continuing all meds as below with no changes today, and continuing to monitor.   As per NP's note on 8/30: A recent meeting held with the attending psychiatrist, CSW & a DSS workers present. The meeting was discuss the discuss current disposition of pt's potential placement . Partner's representatives requested an FL2 and to be completed by CSW and MD. (Awaiting MD completion) . DSS will reach out to Medicaid to change plan from [facility] to [facility] and request a tailored Medicaid plan. Ms. [name] will also start a petition for Guardianship. Ms Corey Skains will continue to search for appropriate placement simultaneously. CSW Will continue to coordinate with DSS regarding placing patient in an appropriate environment for her to reside outside of the hospital setting.  Total Time spent with patient:  45 minutes  Past Psychiatric History: See H & P  Past Medical History:  Past Medical History:  Diagnosis Date   Bipolar affect, depressed (HCC)    Constipation 08/17/2022   Depression    Falls 07/21/2022   Fracture of femoral neck, right, closed (HCC) 01/17/2022   Herpes simplex 08/22/2017   Open  fracture dislocation of right elbow joint 01/17/2022    Past Surgical History:  Procedure Laterality Date   NO PAST SURGERIES     SALPINGECTOMY     Family History: History reviewed. No pertinent family history.  Family Psychiatric  History: See H & P  Social  History:  Social History   Substance and Sexual Activity  Alcohol Use Yes     Social History   Substance and Sexual Activity  Drug Use Yes   Types: Cocaine, Marijuana    Social History   Socioeconomic History   Marital status: Single    Spouse name: Not on file   Number of children: Not on file   Years of education: Not on file   Highest education level: Not on file  Occupational History   Not on file  Tobacco Use   Smoking status: Every Day   Smokeless tobacco: Not on file  Substance and Sexual Activity   Alcohol use: Yes   Drug use: Yes    Types: Cocaine, Marijuana   Sexual activity: Yes  Other Topics Concern   Not on file  Social History Narrative   Not on file   Social Determinants of Health   Financial Resource Strain: Low Risk  (09/12/2022)   Received from [facility]   Overall Financial Resource Strain (CARDIA)    Difficulty of Paying Living Expenses: Not hard at all  Food Insecurity: Patient Declined (12/20/2022)   Hunger Vital Sign    Worried About Running Out of Food in the Last Year: Patient declined    Ran Out of Food in the Last Year: Patient declined  Transportation Needs: No Transportation Needs (12/20/2022)   PRAPARE - Administrator, Civil Service (Medical): No    Lack of Transportation (Non-Medical): No  Physical Activity: Not on file  Stress: No Stress Concern Present (07/17/2022)   Received from [facility] - Occupational Stress Questionnaire    Feeling of Stress : Not at all  Social Connections: Unknown (07/16/2022)   Received from [facility], [facility]   Social Network    Social Network: Not on file   Sleep: Good  Appetite:  Good  Current Medications: Current Facility-Administered Medications  Medication Dose Route Frequency Provider Last Rate Last Admin   acetaminophen (TYLENOL) tablet 650 mg  650 mg Oral Q6H PRN [name], NP   650 mg at  03/09/23 2128   alum & mag hydroxide-simeth (MAALOX/MYLANTA) 200-200-20 MG/5ML suspension 30 mL  30 mL Oral Q4H PRN [name], NP   30 mL at 01/28/23 1530   busPIRone (BUSPAR) tablet 15 mg  15 mg Oral BID [name], NP   15 mg at 03/10/23 0817   cyanocobalamin (VITAMIN B12) injection 1,000 mcg  1,000 mcg Intramuscular Q30 days [name], Nadir, MD   1,000 mcg at 03/04/23 1421   diphenhydrAMINE (BENADRYL) capsule 50 mg  50 mg Oral TID PRN [name], NP   50 mg at 01/15/23 1211   Or   diphenhydrAMINE (BENADRYL) injection 50 mg  50 mg Intramuscular TID PRN [name], NP       feeding supplement (ENSURE ENLIVE / ENSURE PLUS) liquid 237 mL  237 mL Oral TID BM Valrie Jia, NP   237 mL at 03/09/23 2132   haloperidol (HALDOL) tablet 5 mg  5 mg Oral TID PRN [name], NP   5 mg at 01/15/23 1211   Or  haloperidol lactate (HALDOL) injection 5 mg  5 mg Intramuscular TID PRN [name], NP       hydrOXYzine (ATARAX) tablet 25 mg  25 mg Oral TID PRN [name], DO   25 mg at 03/06/23 1816   LORazepam (ATIVAN) tablet 2 mg  2 mg Oral TID PRN [name], NP   2 mg at 01/15/23 1211   Or   LORazepam (ATIVAN) injection 2 mg  2 mg Intramuscular TID PRN [name], NP       magnesium hydroxide (MILK OF MAGNESIA) suspension 30 mL  30 mL Oral Daily PRN [name], NP   30 mL at 02/26/23 1828   melatonin tablet 5 mg  5 mg Oral QHS Saralee Bolick, NP   5 mg at 03/09/23 2125   methylphenidate (RITALIN) tablet 5 mg  5 mg Oral Daily Massengill, Nathan, MD   5 mg at 03/07/23 1610   nicotine (NICODERM CQ - dosed in mg/24 hours) patch 21 mg  21 mg Transdermal Daily [name], DO   21 mg at 03/06/23 9604   paliperidone (INVEGA) 24 hr tablet 6 mg  6 mg Oral Daily [name], NP   6 mg at 03/09/23 2125   pantoprazole (PROTONIX) EC tablet 40 mg  40 mg Oral Daily [name], NP   40 mg at 03/10/23 0817   polyethylene glycol (MIRALAX / GLYCOLAX) packet 17 g  17 g Oral Daily PRN  [name], NP   17 g at 03/01/23 1352   sertraline (ZOLOFT) tablet 150 mg  150 mg Oral Daily Massengill, Harrold Donath, MD   150 mg at 03/10/23 0816   traZODone (DESYREL) tablet 50 mg  50 mg Oral QHS [name], NP   50 mg at 03/09/23 2125   Vitamin D (Ergocalciferol) (DRISDOL) 1.25 MG (50000 UNIT) capsule 50,000 Units  50,000 Units Oral Q7 days [name], NP   50,000 Units at 03/09/23 1717   Lab Results: No results found for this or any previous visit (from the past 48 hour(s)).  Blood Alcohol level:  Lab Results  Component Value Date   ETH <10 12/19/2022   ETH <10 11/21/2019   Metabolic Disorder Labs: Lab Results  Component Value Date   HGBA1C 4.4 (L) 12/21/2022   MPG 79.58 12/21/2022   No results found for: "PROLACTIN" Lab Results  Component Value Date   CHOL 218 (H) 12/21/2022   TRIG 81 12/21/2022   HDL 53 12/21/2022   CHOLHDL 4.1 12/21/2022   VLDL 16 12/21/2022   LDLCALC 149 (H) 12/21/2022   LDLCALC 110 (H) 03/13/2011   Physical Findings: AIMS: Facial and Oral Movements Muscles of Facial Expression: None, normal Lips and Perioral Area: None, normal Jaw: None, normal Tongue: None, normal,Extremity Movements Upper (arms, wrists, hands, fingers): None, normal Lower (legs, knees, ankles, toes): None, normal, Trunk Movements Neck, shoulders, hips: None, normal, Overall Severity Severity of abnormal movements (highest score from questions above): None, normal Incapacitation due to abnormal movements: None, normal Patient's awareness of abnormal movements (rate only patient's report): No Awareness, Dental Status Current problems with teeth and/or dentures?: No Does patient usually wear dentures?: No  CIWA:    COWS:    AIMS:0 Musculoskeletal: Strength & Muscle Tone: within normal limits Gait & Station: normal Patient leans: N/A  Psychiatric Specialty Exam:  Presentation  General Appearance:  Appropriate for Environment; Casual  Eye Contact: Good;  Minimal  Speech: Garbled  Speech Volume: Decreased  Handedness: Right  Mood and Affect  Mood: Irritable  Affect: Congruent  Thought Process  Thought Processes: Coherent  Descriptions of Associations:Intact  Orientation:Partial  Thought Content:Logical  History of Schizophrenia/Schizoaffective disorder:Yes  Duration of Psychotic Symptoms:Greater than six months  Hallucinations:Hallucinations: None    Ideas of Reference:None  Suicidal Thoughts:Suicidal Thoughts: No    Homicidal Thoughts:Homicidal Thoughts: No    Sensorium  Memory: Immediate Good  Judgment: Poor  Insight: Poor  Executive Functions  Concentration: Poor  Attention Span: Poor  Recall: Poor  Fund of Knowledge: Poor  Language: Poor  Psychomotor Activity  Psychomotor Activity: Psychomotor Activity: Normal    Assets  Assets: Resilience  Sleep  Sleep: Sleep: Good   Physical Exam: Physical Exam Vitals and nursing note reviewed.  Constitutional:      Appearance: Normal appearance.  HENT:     Nose: Nose normal.  Eyes:     Pupils: Pupils are equal, round, and reactive to light.  Pulmonary:     Effort: Pulmonary effort is normal.  Genitourinary:    Comments: Deferred Musculoskeletal:        General: Normal range of motion.     Cervical back: Normal range of motion.  Neurological:     Mental Status: She is alert and oriented to person, place, and time.    Review of Systems  Constitutional:  Negative for fever.  HENT:  Negative for hearing loss.   Eyes:  Negative for blurred vision.  Respiratory:  Negative for cough.   Cardiovascular:  Negative for chest pain.  Gastrointestinal:  Negative for heartburn.  Genitourinary:  Negative for dysuria.  Musculoskeletal:  Negative for myalgias.  Skin:  Negative for rash.  Neurological:  Negative for dizziness.  Psychiatric/Behavioral:  Positive for depression. Negative for hallucinations, memory loss, substance  abuse and suicidal ideas. The patient is nervous/anxious and has insomnia.    Blood pressure 96/67, pulse 66, temperature 98.5 F (36.9 C), temperature source Oral, resp. rate 16, height 4\' 11"  (1.499 m), weight 63 kg, SpO2 99%. Body mass index is 28.05 kg/m.  Treatment Plan Summary: Daily contact with patient to assess and evaluate symptoms and progress in treatment and Medication management.    Still waiting on safe disposition as patient would not be able to live independently given cognitive impairment. Recommend assisted living.    No medication side effects reported.  Because of urinary incontinence, recommended adult diapers to be worn.   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  Plan:  -Continue Ritalin 5 mg daily to help with energy & wakefulness -Continue Vitamin D 50.000 units weekly for bone health. -Continue Sertraline150 mg po Q daily for depression/anxiety -Continue Buspar 15 mg po bid for anxiety.  -Continue paliperidone 6 mg po qd for mood stabilization -Continue Trazodone 50 mg nightly for insomnia  -Continue Nicoderm 21 mg topically Q 24 hrs for nicotine withdrawal management. -Continue vitamin B12 1000 mcg IM weekly for 4 weeks then monthly -Continue Melatonin 5 mg nightly for sleep -Continue Protonix EC 40 mg p.o. daily for GERD -Continue MiraLAX 17 g p.o. PRN for constipation -Continue hydroxyzine 25 mg p.o. 3 times daily as needed for anxiety -Continue Ensure nutritional shakes TID in between meals  -Previously discontinued Hydroxyzine 50 mg nightly-hypotension in the mornings   Safety and Monitoring: Voluntary admission to inpatient psychiatric unit for safety, stabilization and treatment Daily contact with patient to  assess and  evaluate symptoms and progress in treatment Patient's case to be discussed in multi-disciplinary team meeting Observation Level : q15 minute checks Vital signs: q12 hours Precautions: Safety   Discharge Planning: Social work and case management to assist with discharge planning and identification of hospital follow-up needs prior to discharge Estimated LOS: Unknown at this time. Discharge Concerns: Need to establish a safety plan; Medication compliance and effectiveness Discharge Goals: Return home with outpatient referrals for mental health follow-up including medication management/psychotherapy No legal guardian, has payee  [name], NP, pmhnp 03/10/2023, 1:39 PM Patient ID: Yolanda [name], female   DOB: December 18, 1975, 47 y.o.   MRN: [id number]

## 2023-03-10 NOTE — Progress Notes (Signed)
   03/10/23 1000  Psych Admission Type (Psych Patients Only)  Admission Status Involuntary  Psychosocial Assessment  Patient Complaints Depression  Eye Contact Fair  Facial Expression Sad  Affect Sad  Speech Soft;Logical/coherent  Interaction Childlike  Motor Activity Slow  Appearance/Hygiene Disheveled  Behavior Characteristics Cooperative  Mood Preoccupied;Sad  Thought Process  Coherency WDL  Content WDL  Delusions None reported or observed  Perception WDL  Hallucination None reported or observed  Judgment Poor  Confusion None  Danger to Self  Current suicidal ideation? Denies  Danger to Others  Danger to Others None reported or observed

## 2023-03-10 NOTE — [facility] Group Notes (Signed)
Patient did not attend the goals group. 

## 2023-03-10 NOTE — [facility] Group Notes (Signed)
[facility] Group Notes:  (Nursing)  Date:  03/10/2023  Time: 1345  Type of Therapy:  Psychoeducational Skills  Participation Level:  Did Not Attend   Shela Nevin 03/10/2023, 4:27 PM

## 2023-03-10 NOTE — Plan of Care (Signed)
  Problem: Activity: Goal: Interest or engagement in leisure activities will improve Outcome: Progressing Goal: Imbalance in normal sleep/wake cycle will improve Outcome: Progressing   

## 2023-03-10 NOTE — Group Note (Signed)
LCSW Group Therapy Note   Group Date: 03/10/2023 Start Time: 1000 End Time: 1100   Type of Therapy and Topic:  Group Therapy:   Participation Level:  Did Not Attend  Description of Group:   Therapeutic Goals:  1.     Summary of Patient Progress:    Pt was invited to group but did not attend  Therapeutic Modalities:   Steffanie Dunn, LCSWA 03/10/2023  11:54 AM

## 2023-03-10 NOTE — Progress Notes (Signed)
   03/09/23 2125  Psych Admission Type (Psych Patients Only)  Admission Status Involuntary  Psychosocial Assessment  Patient Complaints Crying spells;Sadness  Eye Contact Fair  Facial Expression Sad  Affect Flat  Speech Logical/coherent  Interaction Childlike  Motor Activity Slow  Appearance/Hygiene Disheveled  Behavior Characteristics Cooperative  Mood Preoccupied;Sad  Thought Process  Coherency WDL  Content WDL  Delusions None reported or observed  Perception WDL  Hallucination None reported or observed  Judgment Poor  Confusion None  Danger to Self  Current suicidal ideation? Denies  Agreement Not to Harm Self Yes  Description of Agreement verbal  Danger to Others  Danger to Others None reported or observed   Pt is pleasant and cooperative. Pt was offered support and encouragement. Pt was given scheduled medications. Given PRN Tylenol per MAR. Q 15 minute checks were done for safety. Pt attended group and interacts well with peers and staff. Pt has no complaints.Pt receptive to treatment and safety maintained on unit.

## 2023-03-10 NOTE — Plan of Care (Signed)
  Problem: Coping: Goal: Will verbalize feelings Outcome: Progressing   Problem: Self-Concept: Goal: Will verbalize positive feelings about self Outcome: Progressing   Problem: Coping: Goal: Will verbalize feelings Outcome: Progressing   Problem: Activity: Goal: Interest or engagement in activities will improve Outcome: Progressing   Problem: Safety: Goal: Periods of time without injury will increase Outcome: Progressing

## 2023-03-10 NOTE — Progress Notes (Signed)
   03/10/23 2242  Psych Admission Type (Psych Patients Only)  Admission Status Involuntary  Psychosocial Assessment  Patient Complaints Depression  Eye Contact Fair  Facial Expression Animated  Affect Appropriate to circumstance  Speech Logical/coherent  Interaction Childlike  Motor Activity Slow  Appearance/Hygiene Disheveled  Mood Depressed;Pleasant  Thought Process  Coherency WDL  Content WDL  Delusions None reported or observed  Perception WDL  Hallucination None reported or observed  Judgment Poor  Confusion None  Danger to Self  Current suicidal ideation? Denies  Self-Injurious Behavior No self-injurious ideation or behavior indicators observed or expressed   Agreement Not to Harm Self Yes  Description of Agreement verbal  Danger to Others  Danger to Others None reported or observed

## 2023-03-11 DIAGNOSIS — F251 Schizoaffective disorder, depressive type: Secondary | ICD-10-CM | POA: Diagnosis not present

## 2023-03-11 NOTE — [facility] Group Notes (Signed)
[facility] Group Notes:  (Nursing/MHT/Case Management/Adjunct)  Date:  03/11/2023  Time:  8:26 PM  Type of Therapy:   AA group  Participation Level:  Active  Participation Quality:  Appropriate  Affect:  Appropriate  Cognitive:  Appropriate  Insight:  Appropriate  Engagement in Group:  Engaged  Modes of Intervention:  Education  Summary of Progress/Problems:  Noah Delaine 03/11/2023, 8:26 PM

## 2023-03-11 NOTE — Progress Notes (Signed)
   03/11/23 2213  Psych Admission Type (Psych Patients Only)  Admission Status Involuntary  Psychosocial Assessment  Patient Complaints Depression  Eye Contact Fair  Facial Expression Animated  Affect Appropriate to circumstance  Speech Logical/coherent  Interaction Childlike;Assertive  Motor Activity Slow  Appearance/Hygiene Disheveled  Behavior Characteristics Cooperative  Mood Pleasant  Thought Process  Coherency WDL  Content WDL  Delusions None reported or observed  Perception WDL  Hallucination None reported or observed  Judgment Poor  Confusion None  Danger to Self  Current suicidal ideation? Denies  Self-Injurious Behavior No self-injurious ideation or behavior indicators observed or expressed   Agreement Not to Harm Self Yes  Description of Agreement verbal  Danger to Others  Danger to Others None reported or observed

## 2023-03-11 NOTE — Plan of Care (Signed)
  Problem: Education: Goal: Knowledge of the prescribed therapeutic regimen will improve Outcome: Progressing   Problem: Activity: Goal: Interest or engagement in leisure activities will improve Outcome: Progressing

## 2023-03-11 NOTE — Group Note (Signed)
Recreation Therapy Group Note   Group Topic:Problem Solving  Group Date: 03/11/2023 Start Time: 0930 End Time: 1000 Facilitators: Yolanda [name], LRT,CTRS Location: [address] Day[address]   Goal Area(s) Addresses:  Patient will effectively work with peer towards shared goal.  Patient will identify skills used to make activity successful.  Patient will identify how skills used during activity can be applied to reach post d/c goals.   Group Description: [employer]. In teams of 5-6, patients were given 11 craft pipe cleaners. Using the materials provided, patients were instructed to compete again the opposing team(s) to build the tallest free-standing structure from floor level. The activity was timed; difficulty increased by Clinical research associate as Production designer, theatre/television/film continued.  Systematically resources were removed with additional directions for example, placing one arm behind their back, working in silence, and shape stipulations. LRT facilitated post-activity discussion reviewing team processes and necessary communication skills involved in completion. Patients were encouraged to reflect how the skills utilized, or not utilized, in this activity can be incorporated to positively impact support systems post discharge.   Affect/Mood: N/A   Participation Level: Did not attend    Clinical Observations/Individualized Feedback:     Plan: Continue to engage patient in RT group sessions 2-3x/week.   Yolanda [name], LRT,CTRS 03/11/2023 12:19 PM

## 2023-03-11 NOTE — Progress Notes (Signed)
[facility] MD Progress Note  03/11/2023 2:07 PM Yolanda [name]  MRN:  [id number]  Principal Problem: Schizoaffective disorder, depressive type (HCC)  Diagnosis: Principal Problem:   Schizoaffective disorder, depressive type (HCC) Active Problems:   GERD (gastroesophageal reflux disease)   Intellectual disability   Tobacco use disorder  Reason for admission: This is the first psychiatric admission in this [facility] in 12 years for this 47 AA female with an extensive hx of mental illnesses & probable polysubstance use disorders. She is admitted to the [facility] from the [facility] hospital with complain of worsening suicidal ideations with plan to stab herself. Per chart review, patient apparently reported at the ED that she has been depressed for a while & has not been taking her mental health medications. After medical evaluation.clearance, she was transferred to the [facility] for further psychiatric evaluation/treatments.   24 hr chart review: Sleep Hours last night: Reports a good night's sleep last night. Slept 9 hrs as per nsg documentation. Nursing Concerns: none today. Behavioral episodes in the past 24 WUJ:WJXB  Medication Compliance: Compliant  Vital Signs in the past 24 hrs: WNL PRN Medications in the past 24 hrs: Miralax                                             Patient assessment note: Mood is euthymic today, pt was up and out of bed early today, came to the med window for her medications, reports a good sleep last night, reports that she ate breakfast earlier in the morning, and has also eaten lunch. Denies SI/HI/AVH, denies paranoia and there is no evidence of delusional thinking. She denies being in any physical pain today, states that last bowel movement was yesterday. Reports that she is continuing to tolerate her medications well with no med related side effects. Reports, that last BM was yesterday, but shares that it was painful, and that she would like a daily stool softener.   No TD/EPS type  symptoms found on assessment, and pt denies any feelings of stiffness. AIMS: 0. Discussed the following psychosocial stressors: Continuous hospitalization being addressed with CSW. We continue to await DSS to coordinate with pt's treatment team for placement in a living arrangement outside of this hospital that would be beneficial for her.  It is preferable for pt to be assigned a legal guardian by DSS. CSW is in discussions with DSS regarding this. Pt shared that she is excited about a meeting that DSS will be having with her & her CSW today regarding her placement. Continuing all meds as below, and ordering Colace 100 mg daily for constipation.  As per NP's note on 8/30: A recent meeting held with the attending psychiatrist, CSW & a DSS workers present. The meeting was discuss the discuss current disposition of pt's potential placement . Partner's representatives requested an FL2 and to be completed by CSW and MD . DSS will reach out to Medicaid to change plan from [facility] to [facility] and request a tailored Medicaid plan. Ms. [name] will also start a petition for Guardianship. Ms Corey Skains will continue to search for appropriate placement simultaneously. CSW Will continue to coordinate with DSS regarding placing patient in an appropriate environment for her to reside outside of the hospital setting.  Total Time spent with patient:  45 minutes  Past Psychiatric History: See H & P  Past Medical  History:  Past Medical History:  Diagnosis Date   Bipolar affect, depressed (HCC)    Constipation 08/17/2022   Depression    Falls 07/21/2022   Fracture of femoral neck, right, closed (HCC) 01/17/2022   Herpes simplex 08/22/2017   Open fracture dislocation of right elbow joint 01/17/2022    Past Surgical History:  Procedure Laterality Date   NO PAST SURGERIES     SALPINGECTOMY     Family History: History reviewed. No pertinent family history.  Family Psychiatric  History: See H &  P  Social History:  Social History   Substance and Sexual Activity  Alcohol Use Yes     Social History   Substance and Sexual Activity  Drug Use Yes   Types: Cocaine, Marijuana    Social History   Socioeconomic History   Marital status: Single    Spouse name: Not on file   Number of children: Not on file   Years of education: Not on file   Highest education level: Not on file  Occupational History   Not on file  Tobacco Use   Smoking status: Every Day   Smokeless tobacco: Not on file  Substance and Sexual Activity   Alcohol use: Yes   Drug use: Yes    Types: Cocaine, Marijuana   Sexual activity: Yes  Other Topics Concern   Not on file  Social History Narrative   Not on file   Social Determinants of Health   Financial Resource Strain: Low Risk  (09/12/2022)   Received from [facility]   Overall Financial Resource Strain (CARDIA)    Difficulty of Paying Living Expenses: Not hard at all  Food Insecurity: Patient Declined (12/20/2022)   Hunger Vital Sign    Worried About Running Out of Food in the Last Year: Patient declined    Ran Out of Food in the Last Year: Patient declined  Transportation Needs: No Transportation Needs (12/20/2022)   PRAPARE - Administrator, Civil Service (Medical): No    Lack of Transportation (Non-Medical): No  Physical Activity: Not on file  Stress: No Stress Concern Present (07/17/2022)   Received from [facility] - Occupational Stress Questionnaire    Feeling of Stress : Not at all  Social Connections: Unknown (07/16/2022)   Received from [facility]   Social Network    Social Network: Not on file   Sleep: Good  Appetite:  Good  Current Medications: Current Facility-Administered Medications  Medication Dose Route Frequency Provider Last Rate Last Admin   acetaminophen (TYLENOL) tablet 650 mg  650 mg Oral Q6H PRN [name], NP    650 mg at 03/10/23 2210   alum & mag hydroxide-simeth (MAALOX/MYLANTA) 200-200-20 MG/5ML suspension 30 mL  30 mL Oral Q4H PRN [name], NP   30 mL at 01/28/23 1530   busPIRone (BUSPAR) tablet 15 mg  15 mg Oral BID [name], NP   15 mg at 03/11/23 1610   cyanocobalamin (VITAMIN B12) injection 1,000 mcg  1,000 mcg Intramuscular Q30 days Sarita Bottom, MD   1,000 mcg at 03/04/23 1421   diphenhydrAMINE (BENADRYL) capsule 50 mg  50 mg Oral TID PRN [name], NP   50 mg at 01/15/23 1211   Or   diphenhydrAMINE (BENADRYL) injection 50 mg  50 mg Intramuscular TID PRN [name], NP       feeding supplement (ENSURE ENLIVE / ENSURE PLUS) liquid  237 mL  237 mL Oral TID BM Kaneesha Constantino, NP   237 mL at 03/10/23 2113   haloperidol (HALDOL) tablet 5 mg  5 mg Oral TID PRN [name], NP   5 mg at 01/15/23 1211   Or   haloperidol lactate (HALDOL) injection 5 mg  5 mg Intramuscular TID PRN [name], NP       hydrOXYzine (ATARAX) tablet 25 mg  25 mg Oral TID PRN [name], DO   25 mg at 03/06/23 1816   LORazepam (ATIVAN) tablet 2 mg  2 mg Oral TID PRN [name], NP   2 mg at 01/15/23 1211   Or   LORazepam (ATIVAN) injection 2 mg  2 mg Intramuscular TID PRN [name], NP       magnesium hydroxide (MILK OF MAGNESIA) suspension 30 mL  30 mL Oral Daily PRN [name], NP   30 mL at 02/26/23 1828   melatonin tablet 5 mg  5 mg Oral QHS Gearald Stonebraker, NP   5 mg at 03/10/23 2112   methylphenidate (RITALIN) tablet 5 mg  5 mg Oral Daily Massengill, Nathan, MD   5 mg at 03/07/23 1610   nicotine (NICODERM CQ - dosed in mg/24 hours) patch 21 mg  21 mg Transdermal Daily [name], DO   21 mg at 03/06/23 0819   paliperidone (INVEGA) 24 hr tablet 6 mg  6 mg Oral Daily [name], NP   6 mg at 03/10/23 2113   pantoprazole (PROTONIX) EC tablet 40 mg  40 mg Oral Daily [name], NP   40 mg at 03/11/23 9604   polyethylene glycol (MIRALAX / GLYCOLAX) packet 17 g  17 g Oral  Daily PRN [name], NP   17 g at 03/10/23 1510   sertraline (ZOLOFT) tablet 150 mg  150 mg Oral Daily Massengill, Harrold Donath, MD   150 mg at 03/11/23 5409   traZODone (DESYREL) tablet 50 mg  50 mg Oral QHS [name], NP   50 mg at 03/10/23 2112   Vitamin D (Ergocalciferol) (DRISDOL) 1.25 MG (50000 UNIT) capsule 50,000 Units  50,000 Units Oral Q7 days [name], NP   50,000 Units at 03/09/23 1717   Lab Results: No results found for this or any previous visit (from the past 48 hour(s)).  Blood Alcohol level:  Lab Results  Component Value Date   ETH <10 12/19/2022   ETH <10 11/21/2019   Metabolic Disorder Labs: Lab Results  Component Value Date   HGBA1C 4.4 (L) 12/21/2022   MPG 79.58 12/21/2022   No results found for: "PROLACTIN" Lab Results  Component Value Date   CHOL 218 (H) 12/21/2022   TRIG 81 12/21/2022   HDL 53 12/21/2022   CHOLHDL 4.1 12/21/2022   VLDL 16 12/21/2022   LDLCALC 149 (H) 12/21/2022   LDLCALC 110 (H) 03/13/2011   Physical Findings: AIMS: Facial and Oral Movements Muscles of Facial Expression: None, normal Lips and Perioral Area: None, normal Jaw: None, normal Tongue: None, normal,Extremity Movements Upper (arms, wrists, hands, fingers): None, normal Lower (legs, knees, ankles, toes): None, normal, Trunk Movements Neck, shoulders, hips: None, normal, Overall Severity Severity of abnormal movements (highest score from questions above): None, normal Incapacitation due to abnormal movements: None, normal Patient's awareness of abnormal movements (rate only patient's report): No Awareness, Dental Status Current problems with teeth and/or dentures?: No Does patient usually wear dentures?: No  CIWA:    COWS:    AIMS:0 Musculoskeletal: Strength &  Muscle Tone: within normal limits Gait & Station: normal Patient leans: N/A  Psychiatric Specialty Exam:  Presentation  General Appearance:  Casual  Eye Contact: Fair  Speech: Clear and  Coherent  Speech Volume: Normal  Handedness: Right  Mood and Affect  Mood: Euthymic  Affect: Congruent  Thought Process  Thought Processes: Coherent  Descriptions of Associations:Intact  Orientation:Full (Time, Place and Person)  Thought Content:Logical  History of Schizophrenia/Schizoaffective disorder:Yes  Duration of Psychotic Symptoms:Greater than six months  Hallucinations:Hallucinations: None    Ideas of Reference:None  Suicidal Thoughts:Suicidal Thoughts: No    Homicidal Thoughts:Homicidal Thoughts: No    Sensorium  Memory: Immediate Good  Judgment: Fair  Insight: Fair  Art therapist  Concentration: Fair  Attention Span: Fair  Recall: Poor  Fund of Knowledge: Poor  Language: Fair  Psychomotor Activity  Psychomotor Activity: Psychomotor Activity: Normal    Assets  Assets: Resilience  Sleep  Sleep: Sleep: Good   Physical Exam: Physical Exam Vitals and nursing note reviewed.  Constitutional:      Appearance: Normal appearance.  HENT:     Nose: Nose normal.  Eyes:     Pupils: Pupils are equal, round, and reactive to light.  Pulmonary:     Effort: Pulmonary effort is normal.  Genitourinary:    Comments: Deferred Musculoskeletal:        General: Normal range of motion.     Cervical back: Normal range of motion.  Neurological:     Mental Status: She is alert and oriented to person, place, and time.    Review of Systems  Constitutional:  Negative for fever.  HENT:  Negative for hearing loss.   Eyes:  Negative for blurred vision.  Respiratory:  Negative for cough.   Cardiovascular:  Negative for chest pain.  Gastrointestinal:  Negative for heartburn.  Genitourinary:  Negative for dysuria.  Musculoskeletal:  Negative for myalgias.  Skin:  Negative for rash.  Neurological:  Negative for dizziness.  Psychiatric/Behavioral:  Positive for depression. Negative for hallucinations, memory loss, substance  abuse and suicidal ideas. The patient is nervous/anxious and has insomnia.    Blood pressure 116/76, pulse 79, temperature 97.8 F (36.6 C), temperature source Oral, resp. rate 13, height 4\' 11"  (1.499 m), weight 63 kg, SpO2 98%. Body mass index is 28.05 kg/m.  Treatment Plan Summary: Daily contact with patient to assess and evaluate symptoms and progress in treatment and Medication management.    Still waiting on safe disposition as patient would not be able to live independently given cognitive impairment. Recommend assisted living.    No medication side effects reported.  Because of urinary incontinence, recommended adult diapers to be worn.   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  Plan:  -Start Colace 100 mg daily for constipation -Continue Ritalin 5 mg daily to help with energy & wakefulness -Continue Vitamin D 50.000 units weekly for bone health. -Continue Sertraline150 mg po Q daily for depression/anxiety -Continue Buspar 15 mg po bid for anxiety.  -Continue paliperidone 6 mg po qd for mood stabilization -Continue Trazodone 50 mg nightly for insomnia  -Continue Nicoderm 21 mg topically Q 24 hrs for nicotine withdrawal management. -Continue vitamin B12 1000 mcg IM weekly for 4 weeks then monthly -Continue Melatonin 5 mg nightly for sleep -Continue Protonix EC 40 mg p.o. daily for GERD -Continue MiraLAX 17 g p.o. PRN  for constipation -Continue hydroxyzine 25 mg p.o. 3 times daily as needed for anxiety -Continue Ensure nutritional shakes TID in between meals  -Previously discontinued Hydroxyzine 50 mg nightly-hypotension in the mornings   Safety and Monitoring: Voluntary admission to inpatient psychiatric unit for safety, stabilization  and treatment Daily contact with patient to assess and evaluate symptoms and progress in treatment Patient's case to be discussed in multi-disciplinary team meeting Observation Level : q15 minute checks Vital signs: q12 hours Precautions: Safety   Discharge Planning: Social work and case management to assist with discharge planning and identification of hospital follow-up needs prior to discharge Estimated LOS: Unknown at this time. Discharge Concerns: Need to establish a safety plan; Medication compliance and effectiveness Discharge Goals: Return home with outpatient referrals for mental health follow-up including medication management/psychotherapy No legal guardian, has payee  [name], NP, pmhnp 03/11/2023, 2:07 PM Patient ID: Yolanda [name], female   DOB: 04-15-1976, 47 y.o.   MRN: [id number]

## 2023-03-11 NOTE — Progress Notes (Signed)
   03/11/23 0545  15 Minute Checks  Location Bedroom  Visual Appearance Calm  Behavior Sleeping  Sleep (Behavioral Health Patients Only)  Calculate sleep? (Click Yes once per 24 hr at 0600 safety check) Yes  Documented sleep last 24 hours 9

## 2023-03-11 NOTE — Progress Notes (Signed)
   03/11/23 0927  Psych Admission Type (Psych Patients Only)  Admission Status Involuntary  Psychosocial Assessment  Patient Complaints Depression  Eye Contact Brief  Facial Expression Flat  Affect Flat  Speech Logical/coherent  Interaction Assertive  Motor Activity Slow  Appearance/Hygiene Unremarkable  Behavior Characteristics Irritable  Mood Irritable  Thought Process  Coherency WDL  Content WDL  Delusions None reported or observed  Perception WDL  Hallucination None reported or observed  Judgment Poor  Confusion None  Danger to Self  Current suicidal ideation? Denies  Agreement Not to Harm Self Yes  Description of Agreement verbal  Danger to Others  Danger to Others None reported or observed

## 2023-03-12 DIAGNOSIS — F251 Schizoaffective disorder, depressive type: Secondary | ICD-10-CM | POA: Diagnosis not present

## 2023-03-12 LAB — CBC WITH DIFFERENTIAL/PLATELET
Abs Immature Granulocytes: 0.02 10*3/uL (ref 0.00–0.07)
Basophils Absolute: 0 10*3/uL (ref 0.0–0.1)
Basophils Relative: 1 %
Eosinophils Absolute: 0.3 10*3/uL (ref 0.0–0.5)
Eosinophils Relative: 5 %
HCT: 40.5 % (ref 36.0–46.0)
Hemoglobin: 12.6 g/dL (ref 12.0–15.0)
Immature Granulocytes: 0 %
Lymphocytes Relative: 37 %
Lymphs Abs: 2.3 10*3/uL (ref 0.7–4.0)
MCH: 27.9 pg (ref 26.0–34.0)
MCHC: 31.1 g/dL (ref 30.0–36.0)
MCV: 89.8 fL (ref 80.0–100.0)
Monocytes Absolute: 0.4 10*3/uL (ref 0.1–1.0)
Monocytes Relative: 7 %
Neutro Abs: 3 10*3/uL (ref 1.7–7.7)
Neutrophils Relative %: 50 %
Platelets: 256 10*3/uL (ref 150–400)
RBC: 4.51 MIL/uL (ref 3.87–5.11)
RDW: 13.2 % (ref 11.5–15.5)
WBC: 6.1 10*3/uL (ref 4.0–10.5)
nRBC: 0 % (ref 0.0–0.2)

## 2023-03-12 LAB — COMPREHENSIVE METABOLIC PANEL
ALT: 20 U/L (ref 0–44)
AST: 22 U/L (ref 15–41)
Albumin: 3.7 g/dL (ref 3.5–5.0)
Alkaline Phosphatase: 109 U/L (ref 38–126)
Anion gap: 10 (ref 5–15)
BUN: 18 mg/dL (ref 6–20)
CO2: 26 mmol/L (ref 22–32)
Calcium: 9.4 mg/dL (ref 8.9–10.3)
Chloride: 102 mmol/L (ref 98–111)
Creatinine, Ser: 0.8 mg/dL (ref 0.44–1.00)
GFR, Estimated: >60 mL/min (ref >60)
Glucose, Bld: 106 mg/dL — ABNORMAL HIGH (ref 70–99)
Potassium: 3.9 mmol/L (ref 3.5–5.1)
Sodium: 138 mmol/L (ref 135–145)
Total Bilirubin: 0.2 mg/dL — ABNORMAL LOW (ref 0.3–1.2)
Total Protein: 8.2 g/dL — ABNORMAL HIGH (ref 6.5–8.1)

## 2023-03-12 LAB — VITAMIN B12: Vitamin B-12: 874 pg/mL (ref 180–914)

## 2023-03-12 NOTE — Progress Notes (Signed)
   03/12/23 1100  Psych Admission Type (Psych Patients Only)  Admission Status Involuntary  Psychosocial Assessment  Patient Complaints None  Eye Contact Fair  Facial Expression Animated  Affect Appropriate to circumstance  Speech Logical/coherent  Interaction Childlike  Motor Activity Slow  Appearance/Hygiene Disheveled  Behavior Characteristics Cooperative;Calm  Mood Pleasant  Thought Process  Coherency WDL  Content WDL  Delusions None reported or observed  Perception WDL  Hallucination None reported or observed  Judgment Poor  Confusion None  Danger to Self  Current suicidal ideation? Denies  Self-Injurious Behavior No self-injurious ideation or behavior indicators observed or expressed   Danger to Others  Danger to Others None reported or observed

## 2023-03-12 NOTE — Group Note (Signed)
[facility] LCSW Group Therapy Note   Group Date: 03/12/2023 Start Time: 1100 End Time: 1145  Type of Therapy/Topic:  Group Therapy:  Feelings about Diagnosis  Participation Level:  Did Not Attend     Description of Group:    This group will allow patients to explore their thoughts and feelings about diagnoses they have received. Patients will be guided to explore their level of understanding and acceptance of these diagnoses. Facilitator will encourage patients to process their thoughts and feelings about the reactions of others to their diagnosis, and will guide patients in identifying ways to discuss their diagnosis with significant others in their lives. This group will be process-oriented, with patients participating in exploration of their own experiences as well as giving and receiving support and challenge from other group members.   Therapeutic Goals: 1. Patient will demonstrate understanding of diagnosis as evidence by identifying two or more symptoms of the disorder:  2. Patient will be able to express two feelings regarding the diagnosis 3. Patient will demonstrate ability to communicate their needs through discussion and/or role plays     Therapeutic Modalities:   Cognitive Behavioral Therapy Brief Therapy Feelings Identification    Jamelle Noy S Dvon Jiles, LCSW

## 2023-03-12 NOTE — Progress Notes (Signed)
Chaplain met with Yolanda [name] to provide ongoing spiritual and emotional support.  She is in good spirits today and is feeling hopeful that things are moving in a direction of her going home soon. Chaplain provided listening and supportive presence to her and will continue to check in on her whenever able.  [address], Bcc Pager, [phone]

## 2023-03-12 NOTE — Plan of Care (Signed)
  Problem: Education: Goal: Knowledge of the prescribed therapeutic regimen will improve Outcome: Progressing   Problem: Activity: Goal: Interest or engagement in leisure activities will improve Outcome: Progressing Goal: Imbalance in normal sleep/wake cycle will improve Outcome: Progressing   

## 2023-03-12 NOTE — Progress Notes (Signed)
[facility] MD Progress Note  03/12/2023 3:36 PM Yolanda [name]  MRN:  [id number]  Principal Problem: Schizoaffective disorder, depressive type (HCC)  Diagnosis: Principal Problem:   Schizoaffective disorder, depressive type (HCC) Active Problems:   GERD (gastroesophageal reflux disease)   Intellectual disability   Tobacco use disorder  Reason for admission: This is the first psychiatric admission in this [facility] in 12 years for this 10 AA female with an extensive hx of mental illnesses & probable polysubstance use disorders. She is admitted to the [facility] from the [facility] hospital with complain of worsening suicidal ideations with plan to stab herself. Per chart review, patient apparently reported at the ED that she has been depressed for a while & has not been taking her mental health medications. After medical evaluation.clearance, she was transferred to the [facility] for further psychiatric evaluation/treatments.   24 hr chart review: Sleep Hours last night: Reports a poor night's sleep last night. Sleep hrs not documented. Nursing Concerns: none today. Behavioral episodes in the past 24 WUJ:WJXB  Medication Compliance: Compliant  Vital Signs in the past 24 hrs: WNL PRN Medications in the past 24 hrs: None                                             Patient assessment note: On assessment today, the pt reports that their mood is depressed due to still being hospitalized.  Reports that anxiety is minimal Sleep is poor last night, reported feeling tired earlier today morning Appetite is fair  Concentration is good  Energy level is low earlier today morning, but more energetic as the day went by, and was in the day room later in the day interacting with peers and participating in activities. Denies suicidal thoughts. Denies suicidal intent and plan.  Denies having any HI.  Denies having psychotic symptoms.   Denies having side effects to current psychiatric medications.  No TD/EPS type symptoms  found on assessment, and pt denies any feelings of stiffness. AIMS: 0. Discussed the following psychosocial stressors: Continuous hospitalization being addressed with CSW. We continue to await DSS to coordinate with pt's treatment team for placement in a living arrangement outside of this hospital that would be beneficial for her.  It is preferable for pt to be assigned a legal guardian by DSS. CSW is in discussions with DSS regarding this. Pt shared that she is excited about a meeting that DSS will be having with her & her CSW today regarding her placement. As per CSW, there will be another meeting this week regarding placement, and pt states that she would like to participate in meeting. Continuing all meds as below, and rechecking CBC & CMP, Vitamin B12, Vitamin D.  Total Time spent with patient:  45 minutes  Past Psychiatric History: See H & P  Past Medical History:  Past Medical History:  Diagnosis Date   Bipolar affect, depressed (HCC)    Constipation 08/17/2022   Depression    Falls 07/21/2022   Fracture of femoral neck, right, closed (HCC) 01/17/2022   Herpes simplex 08/22/2017   Open fracture dislocation of right elbow joint 01/17/2022    Past Surgical History:  Procedure Laterality Date   NO PAST SURGERIES     SALPINGECTOMY     Family History: History reviewed. No pertinent family history.  Family Psychiatric  History: See H & P  Social History:  Social History   Substance and Sexual Activity  Alcohol Use Yes     Social History   Substance and Sexual Activity  Drug Use Yes   Types: Cocaine, Marijuana    Social History   Socioeconomic History   Marital status: Single    Spouse name: Not on file   Number of children: Not on file   Years of education: Not on file   Highest education level: Not on file  Occupational History   Not on file  Tobacco Use   Smoking status: Every Day   Smokeless tobacco: Not on file  Substance and Sexual Activity   Alcohol use:  Yes   Drug use: Yes    Types: Cocaine, Marijuana   Sexual activity: Yes  Other Topics Concern   Not on file  Social History Narrative   Not on file   Social Determinants of Health   Financial Resource Strain: Low Risk  (09/12/2022)   Received from [facility], [facility]   Overall Financial Resource Strain (CARDIA)    Difficulty of Paying Living Expenses: Not hard at all  Food Insecurity: Patient Declined (12/20/2022)   Hunger Vital Sign    Worried About Running Out of Food in the Last Year: Patient declined    Ran Out of Food in the Last Year: Patient declined  Transportation Needs: No Transportation Needs (12/20/2022)   PRAPARE - Administrator, Civil Service (Medical): No    Lack of Transportation (Non-Medical): No  Physical Activity: Not on file  Stress: No Stress Concern Present (07/17/2022)   Received from [facility], [facility] - Occupational Stress Questionnaire    Feeling of Stress : Not at all  Social Connections: Unknown (07/16/2022)   Received from [facility], [facility]   Social Network    Social Network: Not on file   Sleep: Good  Appetite:  Good  Current Medications: Current Facility-Administered Medications  Medication Dose Route Frequency Provider Last Rate Last Admin   acetaminophen (TYLENOL) tablet 650 mg  650 mg Oral Q6H PRN [name], NP   650 mg at 03/11/23 2145   alum & mag hydroxide-simeth (MAALOX/MYLANTA) 200-200-20 MG/5ML suspension 30 mL  30 mL Oral Q4H PRN [name], NP   30 mL at 01/28/23 1530   busPIRone (BUSPAR) tablet 15 mg  15 mg Oral BID [name], NP   15 mg at 03/12/23 0820   cyanocobalamin (VITAMIN B12) injection 1,000 mcg  1,000 mcg Intramuscular Q30 days [name], Nadir, MD   1,000 mcg at 03/04/23 1421   diphenhydrAMINE (BENADRYL) capsule 50 mg  50 mg Oral TID PRN [name], NP   50 mg at 01/15/23 1211   Or   diphenhydrAMINE (BENADRYL) injection 50 mg  50  mg Intramuscular TID PRN [name], NP       feeding supplement (ENSURE ENLIVE / ENSURE PLUS) liquid 237 mL  237 mL Oral TID BM Deon Duer, NP   237 mL at 03/12/23 1054   haloperidol (HALDOL) tablet 5 mg  5 mg Oral TID PRN [name], NP   5 mg at 01/15/23 1211   Or   haloperidol lactate (HALDOL) injection 5 mg  5 mg Intramuscular TID PRN [name], NP       hydrOXYzine (ATARAX) tablet 25 mg  25 mg Oral TID PRN [name], DO   25 mg at 03/06/23 1816   LORazepam (ATIVAN) tablet 2 mg  2 mg Oral TID PRN [name], NP   2 mg at 01/15/23 1211   Or   LORazepam (ATIVAN) injection 2 mg  2 mg Intramuscular TID PRN [name], NP       magnesium hydroxide (MILK OF MAGNESIA) suspension 30 mL  30 mL Oral Daily PRN [name], NP   30 mL at 02/26/23 1828   melatonin tablet 5 mg  5 mg Oral QHS Christpoher Sievers, NP   5 mg at 03/11/23 2120   methylphenidate (RITALIN) tablet 5 mg  5 mg Oral Daily Massengill, Nathan, MD   5 mg at 03/07/23 1610   nicotine (NICODERM CQ - dosed in mg/24 hours) patch 21 mg  21 mg Transdermal Daily [name], DO   21 mg at 03/06/23 9604   paliperidone (INVEGA) 24 hr tablet 6 mg  6 mg Oral Daily [name], NP   6 mg at 03/11/23 2120   pantoprazole (PROTONIX) EC tablet 40 mg  40 mg Oral Daily [name], NP   40 mg at 03/12/23 0820   polyethylene glycol (MIRALAX / GLYCOLAX) packet 17 g  17 g Oral Daily PRN [name], NP   17 g at 03/10/23 1510   sertraline (ZOLOFT) tablet 150 mg  150 mg Oral Daily Massengill, Harrold Donath, MD   150 mg at 03/12/23 0820   traZODone (DESYREL) tablet 50 mg  50 mg Oral QHS [name], NP   50 mg at 03/11/23 2120   Vitamin D (Ergocalciferol) (DRISDOL) 1.25 MG (50000 UNIT) capsule 50,000 Units  50,000 Units Oral Q7 days [name], NP   50,000 Units at 03/09/23 1717   Lab Results: No results found for this or any previous visit (from the past 48 hour(s)).  Blood Alcohol level:  Lab Results  Component Value  Date   ETH <10 12/19/2022   ETH <10 11/21/2019   Metabolic Disorder Labs: Lab Results  Component Value Date   HGBA1C 4.4 (L) 12/21/2022   MPG 79.58 12/21/2022   No results found for: "PROLACTIN" Lab Results  Component Value Date   CHOL 218 (H) 12/21/2022   TRIG 81 12/21/2022   HDL 53 12/21/2022   CHOLHDL 4.1 12/21/2022   VLDL 16 12/21/2022   LDLCALC 149 (H) 12/21/2022   LDLCALC 110 (H) 03/13/2011   Physical Findings: AIMS: Facial and Oral Movements Muscles of Facial Expression: None, normal Lips and Perioral Area: None, normal Jaw: None, normal Tongue: None, normal,Extremity Movements Upper (arms, wrists, hands, fingers): None, normal Lower (legs, knees, ankles, toes): None, normal, Trunk Movements Neck, shoulders, hips: None, normal, Overall Severity Severity of abnormal movements (highest score from questions above): None, normal Incapacitation due to abnormal movements: None, normal Patient's awareness of abnormal movements (rate only patient's report): No Awareness, Dental Status Current problems with teeth and/or dentures?: No Does patient usually wear dentures?: No  CIWA:    COWS:    AIMS:0 Musculoskeletal: Strength & Muscle Tone: within normal limits Gait & Station: normal Patient leans: N/A  Psychiatric Specialty Exam:  Presentation  General Appearance:  Casual  Eye Contact: Fair  Speech: Clear and Coherent  Speech Volume: Decreased  Handedness: Right  Mood and Affect  Mood: Depressed  Affect: Congruent  Thought Process  Thought Processes: Coherent  Descriptions of Associations:Intact  Orientation:Partial  Thought Content:Logical  History of Schizophrenia/Schizoaffective disorder:Yes  Duration of Psychotic Symptoms:Greater than six months  Hallucinations:Hallucinations: None    Ideas of Reference:None  Suicidal Thoughts:Suicidal Thoughts: No    Homicidal Thoughts:Homicidal  Thoughts: No    Sensorium   Memory: Immediate Good  Judgment: Poor  Insight: Poor  Executive Functions  Concentration: Fair  Attention Span: Fair  Recall: Poor  Fund of Knowledge: Poor  Language: Fair  Lexicographer Activity: Psychomotor Activity: Normal    Assets  Assets: Resilience  Sleep  Sleep: Sleep: Fair   Physical Exam: Physical Exam Vitals and nursing note reviewed.  Constitutional:      Appearance: Normal appearance.  HENT:     Nose: Nose normal.  Eyes:     Pupils: Pupils are equal, round, and reactive to light.  Pulmonary:     Effort: Pulmonary effort is normal.  Genitourinary:    Comments: Deferred Musculoskeletal:        General: Normal range of motion.     Cervical back: Normal range of motion.  Neurological:     Mental Status: She is alert and oriented to person, place, and time.    Review of Systems  Constitutional:  Negative for fever.  HENT:  Negative for hearing loss.   Eyes:  Negative for blurred vision.  Respiratory:  Negative for cough.   Cardiovascular:  Negative for chest pain.  Gastrointestinal:  Negative for heartburn.  Genitourinary:  Negative for dysuria.  Musculoskeletal:  Negative for myalgias.  Skin:  Negative for rash.  Neurological:  Negative for dizziness.  Psychiatric/Behavioral:  Positive for depression. Negative for hallucinations, memory loss, substance abuse and suicidal ideas. The patient is nervous/anxious and has insomnia.    Blood pressure 99/70, pulse 88, temperature 98.9 F (37.2 C), temperature source Oral, resp. rate 13, height 4\' 11"  (1.499 m), weight 63 kg, SpO2 100%. Body mass index is 28.05 kg/m.  Treatment Plan Summary: Daily contact with patient to assess and evaluate symptoms and progress in treatment and Medication management.    Still waiting on safe disposition as patient would not be able to live independently given cognitive impairment. Recommend assisted living.    No medication side  effects reported.  Because of urinary incontinence, recommended adult diapers to be worn.   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  Plan:  -Continue Colace 100 mg daily for constipation -Continue Ritalin 5 mg daily to help with energy & wakefulness -Continue Vitamin D 50.000 units weekly for bone health. -Continue Sertraline150 mg po Q daily for depression/anxiety -Continue Buspar 15 mg po bid for anxiety.  -Continue paliperidone 6 mg po qd for mood stabilization -Continue Trazodone 50 mg nightly for insomnia  -Continue Nicoderm 21 mg topically Q 24 hrs for nicotine withdrawal management. -Continue vitamin B12 1000 mcg IM weekly for 4 weeks then monthly -Continue Melatonin 5 mg nightly for sleep -Continue Protonix EC 40 mg p.o. daily for GERD -Continue MiraLAX 17 g p.o. PRN for constipation -Continue hydroxyzine 25 mg p.o. 3 times daily as needed for anxiety -Continue Ensure nutritional shakes TID in between meals  -Previously discontinued Hydroxyzine 50 mg nightly-hypotension in the mornings   Safety and Monitoring: Voluntary admission to inpatient psychiatric unit for safety, stabilization and treatment Daily contact with patient to assess and evaluate symptoms and progress in treatment Patient's case to be discussed in multi-disciplinary team meeting Observation Level : q15 minute checks Vital signs: q12 hours Precautions: Safety   Discharge Planning: Social work and case management to assist with discharge planning and identification of hospital follow-up  needs prior to discharge Estimated LOS: Unknown at this time. Discharge Concerns: Need to establish a safety plan; Medication compliance and effectiveness Discharge Goals: Return home  with outpatient referrals for mental health follow-up including medication management/psychotherapy No legal guardian, has payee  [name], NP, pmhnp 03/12/2023, 3:36 PM Patient ID: Larey Brick, female   DOB: 1976-07-03, 47 y.o.   MRN: [id number]

## 2023-03-12 NOTE — Group Note (Signed)
Recreation Therapy Group Note   Group Topic:Animal Assisted Therapy   Group Date: 03/12/2023 Start Time: 0950 End Time: 1030 Facilitators: Walt Geathers-McCall, LRT,CTRS Location: [address] Dayroom   Animal-Assisted Activity (AAA) Program Checklist/Progress Notes Patient Eligibility Criteria Checklist & Daily Group note for Rec Tx Intervention  AAA/T Program Assumption of Risk Form signed by Patient/ or Parent Legal Guardian Yes  Patient understands his/her participation is voluntary Yes   Affect/Mood: N/A   Participation Level: Did not attend    Clinical Observations/Individualized Feedback:     Plan: Continue to engage patient in RT group sessions 2-3x/week.   Paxson Harrower-McCall, LRT,CTRS 03/12/2023 12:19 PM

## 2023-03-12 NOTE — Plan of Care (Signed)
  Problem: Education: Goal: Utilization of techniques to improve thought processes will improve Outcome: Progressing Goal: Knowledge of the prescribed therapeutic regimen will improve Outcome: Progressing   Problem: Activity: Goal: Interest or engagement in leisure activities will improve Outcome: Progressing Goal: Imbalance in normal sleep/wake cycle will improve Outcome: Progressing   Problem: Coping: Goal: Coping ability will improve Outcome: Progressing Goal: Will verbalize feelings Outcome: Progressing   Problem: Health Behavior/Discharge Planning: Goal: Ability to make decisions will improve Outcome: Progressing Goal: Compliance with therapeutic regimen will improve Outcome: Progressing   

## 2023-03-13 ENCOUNTER — Encounter (HOSPITAL_COMMUNITY): Payer: Self-pay

## 2023-03-13 DIAGNOSIS — F251 Schizoaffective disorder, depressive type: Secondary | ICD-10-CM | POA: Diagnosis not present

## 2023-03-13 LAB — VITAMIN D 25 HYDROXY (VIT D DEFICIENCY, FRACTURES): Vit D, 25-Hydroxy: 37.5 ng/mL (ref 30–100)

## 2023-03-13 NOTE — Progress Notes (Signed)
   03/13/23 0558  15 Minute Checks  Location Bedroom  Visual Appearance Calm  Behavior Sleeping  Sleep (Behavioral Health Patients Only)  Calculate sleep? (Click Yes once per 24 hr at 0600 safety check) Yes  Documented sleep last 24 hours 15.25

## 2023-03-13 NOTE — Progress Notes (Signed)
[facility] MD Progress Note  03/13/2023 9:22 AM Yolanda [name]  MRN:  [id number]  Principal Problem: Schizoaffective disorder, depressive type (HCC)  Diagnosis: Principal Problem:   Schizoaffective disorder, depressive type (HCC) Active Problems:   GERD (gastroesophageal reflux disease)   Intellectual disability   Tobacco use disorder  Reason for admission: This is the first psychiatric admission in this [facility] in 12 years for this 47 AA female with an extensive hx of mental illnesses & probable polysubstance use disorders. She is admitted to the [facility] from the [facility] hospital with complain of worsening suicidal ideations with plan to stab herself. Per chart review, patient apparently reported at the ED that she has been depressed for a while & has not been taking her mental health medications. After medical evaluation.clearance, she was transferred to the [name] for further psychiatric evaluation/treatments.   24 hr chart review: Sleep Hours last night: Reports a poor night's sleep last night. Sleep hrs not documented. Nursing Concerns: none today. Behavioral episodes in the past 24 hrs: None reported by staff.  Medication Compliance: Compliant  Vital Signs in the past 24 hrs: WNL PRN Medications in the past 24 hrs: None                                             Patient assessment note: Yolanda [name] is seen in her room, chart reviewed. The chart findings discussed with the treatment team. She was lying down in bed during this evaluation. She presents with a good affect. Good eye contact & verbally responsive. She appears excited & happy. She reports, "I'm doing alright. My mood is good. I was told by the doctorr this morning that I will be discharged either on Thursday of Friday of this week. I was told that they have found a home or house for me. I feel happy. I'm going to be going home soon. [name] currently denies any SIHI, AVH, delusional thoughts or paranoia. She does not appear to be responding  to any internal stimuli. Both the social worker & the attending psychiatrist has stated that Yolanda [name] does not have any home to go to at this time. They both stated that that information did not come from them to Yolanda [name]. Patient denies any side effects to current psychiatric medications. There are no TD/EPS type symptoms found on assessment. Patient denies any stiffness.   Discussed the following psychosocial stressors: Continuous hospitalization being addressed with CSW. We continue to await DSS to coordinate with pt's treatment team for placement in a living arrangement outside of this hospital that would be beneficial for patient.  It is preferable for pt to be assigned a legal guardian by DSS. CSW is in discussions with DSS regarding this. As per CSW, there will be another meeting with the DSS staff tomorrow regarding placement. There are no changes made on the current plan of care. Reviewed current lab results, stable. Will continue current plan of care as already in progress.  Total Time spent with patient:  35 minutes  Past Psychiatric History: See H & P  Past Medical History:  Past Medical History:  Diagnosis Date   Bipolar affect, depressed (HCC)    Constipation 08/17/2022   Depression    Falls 07/21/2022   Fracture of femoral neck, right, closed (HCC) 01/17/2022   Herpes simplex 08/22/2017   Open fracture dislocation of right elbow joint 01/17/2022  Past Surgical History:  Procedure Laterality Date   NO PAST SURGERIES     SALPINGECTOMY     Family History: History reviewed. No pertinent family history.  Family Psychiatric  History: See H & P  Social History:  Social History   Substance and Sexual Activity  Alcohol Use Yes     Social History   Substance and Sexual Activity  Drug Use Yes   Types: Cocaine, Marijuana    Social History   Socioeconomic History   Marital status: Single    Spouse name: Not on file   Number of children: Not on file   Years of  education: Not on file   Highest education level: Not on file  Occupational History   Not on file  Tobacco Use   Smoking status: Every Day   Smokeless tobacco: Not on file  Substance and Sexual Activity   Alcohol use: Yes   Drug use: Yes    Types: Cocaine, Marijuana   Sexual activity: Yes  Other Topics Concern   Not on file  Social History Narrative   Not on file   Social Determinants of Health   Financial Resource Strain: Low Risk  (09/12/2022)   Received from [facility]   Overall Financial Resource Strain (CARDIA)    Difficulty of Paying Living Expenses: Not hard at all  Food Insecurity: Patient Declined (12/20/2022)   Hunger Vital Sign    Worried About Running Out of Food in the Last Year: Patient declined    Ran Out of Food in the Last Year: Patient declined  Transportation Needs: No Transportation Needs (12/20/2022)   PRAPARE - Administrator, Civil Service (Medical): No    Lack of Transportation (Non-Medical): No  Physical Activity: Not on file  Stress: No Stress Concern Present (07/17/2022)   Received from [facility] [employer] of Occupational [employer] - Occupational Stress Questionnaire    Feeling of Stress : Not at all  Social Connections: Unknown (07/16/2022)   Received from [facility]   Social Network    Social Network: Not on file   Sleep: Good  Appetite:  Good  Current Medications: Current Facility-Administered Medications  Medication Dose Route Frequency Provider Last Rate Last Admin   acetaminophen (TYLENOL) tablet 650 mg  650 mg Oral Q6H PRN [name], NP   650 mg at 03/11/23 2145   alum & mag hydroxide-simeth (MAALOX/MYLANTA) 200-200-20 MG/5ML suspension 30 mL  30 mL Oral Q4H PRN [name], NP   30 mL at 01/28/23 1530   busPIRone (BUSPAR) tablet 15 mg  15 mg Oral BID [name], NP   15 mg at 03/12/23 2137   cyanocobalamin (VITAMIN B12) injection 1,000 mcg  1,000 mcg  Intramuscular Q30 days Sarita Bottom, MD   1,000 mcg at 03/04/23 1421   diphenhydrAMINE (BENADRYL) capsule 50 mg  50 mg Oral TID PRN [name], NP   50 mg at 01/15/23 1211   Or   diphenhydrAMINE (BENADRYL) injection 50 mg  50 mg Intramuscular TID PRN [name], NP       feeding supplement (ENSURE ENLIVE / ENSURE PLUS) liquid 237 mL  237 mL Oral TID BM Nkwenti, Doris, NP   237 mL at 03/12/23 2137   haloperidol (HALDOL) tablet 5 mg  5 mg Oral TID PRN [name], NP   5 mg at 01/15/23 1211   Or   haloperidol lactate (HALDOL) injection 5 mg  5  mg Intramuscular TID PRN [name], NP       hydrOXYzine (ATARAX) tablet 25 mg  25 mg Oral TID PRN [name], DO   25 mg at 03/06/23 1816   LORazepam (ATIVAN) tablet 2 mg  2 mg Oral TID PRN [name], NP   2 mg at 01/15/23 1211   Or   LORazepam (ATIVAN) injection 2 mg  2 mg Intramuscular TID PRN [name], NP       magnesium hydroxide (MILK OF MAGNESIA) suspension 30 mL  30 mL Oral Daily PRN [name], NP   30 mL at 02/26/23 1828   melatonin tablet 5 mg  5 mg Oral QHS [name], NP   5 mg at 03/12/23 2137   methylphenidate (RITALIN) tablet 5 mg  5 mg Oral Daily Massengill, Harrold Donath, MD   5 mg at 03/07/23 1610   nicotine (NICODERM CQ - dosed in mg/24 hours) patch 21 mg  21 mg Transdermal Daily [name], DO   21 mg at 03/06/23 9604   paliperidone (INVEGA) 24 hr tablet 6 mg  6 mg Oral Daily [name], NP   6 mg at 03/12/23 2137   pantoprazole (PROTONIX) EC tablet 40 mg  40 mg Oral Daily [name], NP   40 mg at 03/12/23 0820   polyethylene glycol (MIRALAX / GLYCOLAX) packet 17 g  17 g Oral Daily PRN [name], NP   17 g at 03/12/23 1953   sertraline (ZOLOFT) tablet 150 mg  150 mg Oral Daily Massengill, Harrold Donath, MD   150 mg at 03/12/23 0820   traZODone (DESYREL) tablet 50 mg  50 mg Oral QHS [name], NP   50 mg at 03/12/23 2137   Vitamin D (Ergocalciferol) (DRISDOL) 1.25 MG (50000 UNIT) capsule 50,000  Units  50,000 Units Oral Q7 days [name], NP   50,000 Units at 03/09/23 1717   Lab Results:  Results for orders placed or performed during the hospital encounter of 12/20/22 (from the past 48 hour(s))  Comprehensive metabolic panel     Status: Abnormal   Collection Time: 03/12/23  6:21 PM  Result Value Ref Range   Sodium 138 135 - 145 mmol/L   Potassium 3.9 3.5 - 5.1 mmol/L   Chloride 102 98 - 111 mmol/L   CO2 26 22 - 32 mmol/L   Glucose, Bld 106 (H) 70 - 99 mg/dL    Comment: Glucose reference range applies only to samples taken after fasting for at least 8 hours.   BUN 18 6 - 20 mg/dL   Creatinine, Ser 5.40 0.44 - 1.00 mg/dL   Calcium 9.4 8.9 - 98.1 mg/dL   Total Protein 8.2 (H) 6.5 - 8.1 g/dL   Albumin 3.7 3.5 - 5.0 g/dL   AST 22 15 - 41 U/L   ALT 20 0 - 44 U/L   Alkaline Phosphatase 109 38 - 126 U/L   Total Bilirubin 0.2 (L) 0.3 - 1.2 mg/dL   GFR, Estimated >19 >14 mL/min    Comment: (NOTE) Calculated using the CKD-EPI Creatinine Equation (2021)    Anion gap 10 5 - 15    Comment: Performed at [facility], [address], [place], [place] [address]  CBC with Differential/Platelet     Status: None   Collection Time: 03/12/23  6:21 PM  Result Value Ref Range   WBC 6.1 4.0 - 10.5 K/uL   RBC 4.51 3.87 - 5.11 MIL/uL   Hemoglobin 12.6 12.0 -  15.0 g/dL   HCT 82.9 56.2 - 13.0 %   MCV 89.8 80.0 - 100.0 fL   MCH 27.9 26.0 - 34.0 pg   MCHC 31.1 30.0 - 36.0 g/dL   RDW 86.5 78.4 - 69.6 %   Platelets 256 150 - 400 K/uL   nRBC 0.0 0.0 - 0.2 %   Neutrophils Relative % 50 %   Neutro Abs 3.0 1.7 - 7.7 K/uL   Lymphocytes Relative 37 %   Lymphs Abs 2.3 0.7 - 4.0 K/uL   Monocytes Relative 7 %   Monocytes Absolute 0.4 0.1 - 1.0 K/uL   Eosinophils Relative 5 %   Eosinophils Absolute 0.3 0.0 - 0.5 K/uL   Basophils Relative 1 %   Basophils Absolute 0.0 0.0 - 0.1 K/uL   Immature Granulocytes 0 %   Abs Immature Granulocytes 0.02 0.00 - 0.07 K/uL    Comment:  Performed at [facility], [address] [place], [place] [address]  Vitamin B12     Status: None   Collection Time: 03/12/23  6:21 PM  Result Value Ref Range   Vitamin B-12 874 180 - 914 pg/mL    Comment: (NOTE) This assay is not validated for testing neonatal or myeloproliferative syndrome specimens for Vitamin B12 levels. Performed at [facility], [address], [place], [place] [address]   VITAMIN D 25 Hydroxy (Vit-D Deficiency, Fractures)     Status: None   Collection Time: 03/12/23  6:21 PM  Result Value Ref Range   Vit D, 25-Hydroxy 37.50 30 - 100 ng/mL    Comment: (NOTE) Vitamin D deficiency has been defined by the Institute of Medicine  and an Endocrine Society practice guideline as a level of serum 25-OH  vitamin D less than 20 ng/mL (1,2). The Endocrine Society went on to  further define vitamin D insufficiency as a level between 21 and 29  ng/mL (2).  1. IOM (Institute of Medicine). 2010. Dietary reference intakes for  calcium and D. [place]: [employer]. 2. Holick MF, Binkley Tuleta, Bischoff-Ferrari HA, et al. Evaluation,  treatment, and prevention of vitamin D deficiency: an Endocrine  Society clinical practice guideline, JCEM. 2011 Jul; 96(7): 1911-30.  Performed at [facility], [address], [place], [place] [address]     Blood Alcohol level:  Lab Results  Component Value Date   [facility] <10 12/19/2022   ETH <10 11/21/2019   Metabolic Disorder Labs: Lab Results  Component Value Date   HGBA1C 4.4 (L) 12/21/2022   MPG 79.58 12/21/2022   No results found for: "PROLACTIN" Lab Results  Component Value Date   CHOL 218 (H) 12/21/2022   TRIG 81 12/21/2022   HDL 53 12/21/2022   CHOLHDL 4.1 12/21/2022   VLDL 16 12/21/2022   LDLCALC 149 (H) 12/21/2022   LDLCALC 110 (H) 03/13/2011   Physical Findings: AIMS: Facial and Oral Movements Muscles of Facial Expression: None, normal Lips and  Perioral Area: None, normal Jaw: None, normal Tongue: None, normal,Extremity Movements Upper (arms, wrists, hands, fingers): None, normal Lower (legs, knees, ankles, toes): None, normal, Trunk Movements Neck, shoulders, hips: None, normal, Overall Severity Severity of abnormal movements (highest score from questions above): None, normal Incapacitation due to abnormal movements: None, normal Patient's awareness of abnormal movements (rate only patient's report): No Awareness, Dental Status Current problems with teeth and/or dentures?: No Does patient usually wear dentures?: No  CIWA:    COWS:    AIMS:0 Musculoskeletal: Strength &  Muscle Tone: within normal limits Gait & Station: normal Patient leans: N/A  Psychiatric Specialty Exam:  Presentation  General Appearance:  Casual  Eye Contact: Fair  Speech: Clear and Coherent  Speech Volume: Decreased  Handedness: Right  Mood and Affect  Mood: Depressed  Affect: Congruent  Thought Process  Thought Processes: Coherent  Descriptions of Associations:Intact  Orientation:Partial  Thought Content:Logical  History of Schizophrenia/Schizoaffective disorder:Yes  Duration of Psychotic Symptoms:Greater than six months  Hallucinations:Hallucinations: None    Ideas of Reference:None  Suicidal Thoughts:Suicidal Thoughts: No    Homicidal Thoughts:Homicidal Thoughts: No    Sensorium  Memory: Immediate Good  Judgment: Poor  Insight: Poor  Executive Functions  Concentration: Fair  Attention Span: Fair  Recall: Poor  Fund of Knowledge: Poor  Language: Fair  Psychomotor Activity  Psychomotor Activity: Psychomotor Activity: Normal    Assets  Assets: Resilience  Sleep  Sleep: Sleep: Fair   Physical Exam: Physical Exam Vitals and nursing note reviewed.  Constitutional:      Appearance: Normal appearance.  HENT:     Nose: Nose normal.  Eyes:     Pupils: Pupils are equal,  round, and reactive to light.  Pulmonary:     Effort: Pulmonary effort is normal.  Genitourinary:    Comments: Deferred Musculoskeletal:        General: Normal range of motion.     Cervical back: Normal range of motion.  Neurological:     Mental Status: She is alert and oriented to person, place, and time.    Review of Systems  Constitutional:  Negative for fever.  HENT:  Negative for hearing loss.   Eyes:  Negative for blurred vision.  Respiratory:  Negative for cough.   Cardiovascular:  Negative for chest pain.  Gastrointestinal:  Negative for heartburn.  Genitourinary:  Negative for dysuria.  Musculoskeletal:  Negative for myalgias.  Skin:  Negative for rash.  Neurological:  Negative for dizziness.  Psychiatric/Behavioral:  Positive for depression. Negative for hallucinations, memory loss, substance abuse and suicidal ideas. The patient is nervous/anxious and has insomnia.    Blood pressure 99/70, pulse 88, temperature 98.9 F (37.2 C), temperature source Oral, resp. rate 13, height 4\' 11"  (1.499 m), weight 63 kg, SpO2 100%. Body mass index is 28.05 kg/m.  Treatment Plan Summary: Daily contact with patient to assess and evaluate symptoms and progress in treatment and Medication management.    Still waiting on safe disposition as patient would not be able to live independently given cognitive impairment. Recommend assisted living.    No medication side effects reported.  Because of urinary incontinence, recommended adult diapers to be worn.   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  Plan:  -Continue Colace 100 mg daily for constipation -Continue Ritalin 5 mg daily to help with energy & wakefulness -Continue Vitamin D 50.000 units  weekly for bone health. -Continue Sertraline150 mg po Q daily for depression/anxiety -Continue Buspar 15 mg po bid for anxiety.  -Continue paliperidone 6 mg po qd for mood stabilization -Continue Trazodone 50 mg nightly for insomnia  -Continue Nicoderm 21 mg topically Q 24 hrs for nicotine withdrawal management. -Continue vitamin B12 1000 mcg IM weekly for 4 weeks then monthly -Continue Melatonin 5 mg nightly for sleep -Continue Protonix EC 40 mg p.o. daily for GERD -Continue MiraLAX 17 g p.o. PRN for constipation -Continue hydroxyzine  25 mg p.o. 3 times daily as needed for anxiety -Continue Ensure nutritional shakes TID in between meals  -Previously discontinued Hydroxyzine 50 mg nightly-hypotension in the mornings   Safety and Monitoring: Voluntary admission to inpatient psychiatric unit for safety, stabilization and treatment Daily contact with patient to assess and evaluate symptoms and progress in treatment Patient's case to be discussed in multi-disciplinary team meeting Observation Level : q15 minute checks Vital signs: q12 hours Precautions: Safety   Discharge Planning: Social work and case management to assist with discharge planning and identification of hospital follow-up needs prior to discharge Estimated LOS: Unknown at this time. Discharge Concerns: Need to establish a safety plan; Medication compliance and effectiveness Discharge Goals: Return home with outpatient referrals for mental health follow-up including medication management/psychotherapy No legal guardian, has payee  Armandina Stammer, NP, pmhnp 03/13/2023, 9:22 AM Patient ID: Yolanda [name], female   DOB: 1976-02-10, 47 y.o.   MRN: [id number]  Patient ID: Yolanda [name], female   DOB: 06/10/1976, 47 y.o.   MRN: [id number]

## 2023-03-13 NOTE — [facility] Group Notes (Signed)
Patient did not attend the NA group. 

## 2023-03-13 NOTE — BH IP Treatment Plan (Signed)
Interdisciplinary Treatment and Diagnostic Plan Update  03/13/2023 Time of Session: 1010 AZIYA GEITNER MRN: [id number]  Principal Diagnosis: Schizoaffective disorder, depressive type (HCC)  Secondary Diagnoses: Principal Problem:   Schizoaffective disorder, depressive type (HCC) Active Problems:   GERD (gastroesophageal reflux disease)   Intellectual disability   Tobacco use disorder   Current Medications:  Current Facility-Administered Medications  Medication Dose Route Frequency Provider Last Rate Last Admin   acetaminophen (TYLENOL) tablet 650 mg  650 mg Oral Q6H PRN [name], NP   650 mg at 03/11/23 2145   alum & mag hydroxide-simeth (MAALOX/MYLANTA) 200-200-20 MG/5ML suspension 30 mL  30 mL Oral Q4H PRN [name], NP   30 mL at 01/28/23 1530   busPIRone (BUSPAR) tablet 15 mg  15 mg Oral BID [name], NP   15 mg at 03/13/23 1011   cyanocobalamin (VITAMIN B12) injection 1,000 mcg  1,000 mcg Intramuscular Q30 days [name], Nadir, MD   1,000 mcg at 03/04/23 1421   diphenhydrAMINE (BENADRYL) capsule 50 mg  50 mg Oral TID PRN [name], NP   50 mg at 01/15/23 1211   Or   diphenhydrAMINE (BENADRYL) injection 50 mg  50 mg Intramuscular TID PRN [name], NP       feeding supplement (ENSURE ENLIVE / ENSURE PLUS) liquid 237 mL  237 mL Oral TID BM Nkwenti, Doris, NP   237 mL at 03/13/23 1014   haloperidol (HALDOL) tablet 5 mg  5 mg Oral TID PRN [name], NP   5 mg at 01/15/23 1211   Or   haloperidol lactate (HALDOL) injection 5 mg  5 mg Intramuscular TID PRN [name], NP       hydrOXYzine (ATARAX) tablet 25 mg  25 mg Oral TID PRN [name], DO   25 mg at 03/06/23 1816   LORazepam (ATIVAN) tablet 2 mg  2 mg Oral TID PRN [name], NP   2 mg at 01/15/23 1211   Or   LORazepam (ATIVAN) injection 2 mg  2 mg Intramuscular TID PRN [name], NP       magnesium hydroxide (MILK OF MAGNESIA) suspension 30 mL  30 mL Oral Daily PRN [name], NP    30 mL at 02/26/23 1828   melatonin tablet 5 mg  5 mg Oral QHS Nkwenti, Doris, NP   5 mg at 03/12/23 2137   methylphenidate (RITALIN) tablet 5 mg  5 mg Oral Daily Massengill, Nathan, MD   5 mg at 03/07/23 3329   nicotine (NICODERM CQ - dosed in mg/24 hours) patch 21 mg  21 mg Transdermal Daily [name], DO   21 mg at 03/06/23 0819   paliperidone (INVEGA) 24 hr tablet 6 mg  6 mg Oral Daily [name], NP   6 mg at 03/12/23 2137   pantoprazole (PROTONIX) EC tablet 40 mg  40 mg Oral Daily Nwoko, Nicole Kindred I, NP   40 mg at 03/13/23 1011   polyethylene glycol (MIRALAX / GLYCOLAX) packet 17 g  17 g Oral Daily PRN [name], NP   17 g at 03/12/23 1953   sertraline (ZOLOFT) tablet 150 mg  150 mg Oral Daily Massengill, Harrold Donath, MD   150 mg at 03/13/23 1012   traZODone (DESYREL) tablet 50 mg  50 mg Oral QHS [name], NP   50 mg at 03/12/23 2137   Vitamin D (Ergocalciferol) (DRISDOL) 1.25 MG (50000 UNIT) capsule 50,000 Units  50,000 Units Oral Q7 days [name], NP  50,000 Units at 03/09/23 1717   PTA Medications: Medications Prior to Admission  Medication Sig Dispense Refill Last Dose   busPIRone (BUSPAR) 15 MG tablet Take 15 mg by mouth 2 (two) times daily. (Patient not taking: Reported on 12/19/2022)      paliperidone (INVEGA SUSTENNA) 156 MG/ML SUSY injection Inject 156 mg into the muscle once. (Patient not taking: Reported on 12/19/2022)      sertraline (ZOLOFT) 50 MG tablet Take 150 mg by mouth daily. (Patient not taking: Reported on 12/19/2022)      traZODone (DESYREL) 100 MG tablet Take 100 mg by mouth at bedtime as needed for sleep. (Patient not taking: Reported on 11/12/2022)       Patient Stressors: Medication change or noncompliance    Patient Strengths: Forensic psychologist fund of knowledge   Treatment Modalities: Medication Management, Group therapy, Case management,  1 to 1 session with clinician, Psychoeducation, Recreational therapy.   Physician Treatment  Plan for Primary Diagnosis: Schizoaffective disorder, depressive type (HCC) Long Term Goal(s): Improvement in symptoms so as ready for discharge   Short Term Goals: Ability to identify and develop effective coping behaviors will improve Ability to maintain clinical measurements within normal limits will improve Compliance with prescribed medications will improve Ability to identify triggers associated with substance abuse/mental health issues will improve Ability to identify changes in lifestyle to reduce recurrence of condition will improve Ability to verbalize feelings will improve Ability to disclose and discuss suicidal ideas Ability to demonstrate self-control will improve  Medication Management: Evaluate patient's response, side effects, and tolerance of medication regimen.  Therapeutic Interventions: 1 to 1 sessions, Unit Group sessions and Medication administration.  Evaluation of Outcomes: Progressing  Physician Treatment Plan for Secondary Diagnosis: Principal Problem:   Schizoaffective disorder, depressive type (HCC) Active Problems:   GERD (gastroesophageal reflux disease)   Intellectual disability   Tobacco use disorder  Long Term Goal(s): Improvement in symptoms so as ready for discharge   Short Term Goals: Ability to identify and develop effective coping behaviors will improve Ability to maintain clinical measurements within normal limits will improve Compliance with prescribed medications will improve Ability to identify triggers associated with substance abuse/mental health issues will improve Ability to identify changes in lifestyle to reduce recurrence of condition will improve Ability to verbalize feelings will improve Ability to disclose and discuss suicidal ideas Ability to demonstrate self-control will improve     Medication Management: Evaluate patient's response, side effects, and tolerance of medication regimen.  Therapeutic Interventions: 1 to 1  sessions, Unit Group sessions and Medication administration.  Evaluation of Outcomes: Progressing   RN Treatment Plan for Primary Diagnosis: Schizoaffective disorder, depressive type (HCC) Long Term Goal(s): Knowledge of disease and therapeutic regimen to maintain health will improve  Short Term Goals: Ability to remain free from injury will improve, Ability to verbalize frustration and anger appropriately will improve, Ability to demonstrate self-control, Ability to participate in decision making will improve, Ability to verbalize feelings will improve, Ability to disclose and discuss suicidal ideas, Ability to identify and develop effective coping behaviors will improve, and Compliance with prescribed medications will improve  Medication Management: RN will administer medications as ordered by provider, will assess and evaluate patient's response and provide education to patient for prescribed medication. RN will report any adverse and/or side effects to prescribing provider.  Therapeutic Interventions: 1 on 1 counseling sessions, Psychoeducation, Medication administration, Evaluate responses to treatment, Monitor vital signs and CBGs as ordered, Perform/monitor CIWA, COWS, AIMS and Fall  Risk screenings as ordered, Perform wound care treatments as ordered.  Evaluation of Outcomes: Progressing   LCSW Treatment Plan for Primary Diagnosis: Schizoaffective disorder, depressive type (HCC) Long Term Goal(s): Safe transition to appropriate next level of care at discharge, Engage patient in therapeutic group addressing interpersonal concerns.  Short Term Goals: Engage patient in aftercare planning with referrals and resources, Increase social support, Increase ability to appropriately verbalize feelings, Increase emotional regulation, Facilitate acceptance of mental health diagnosis and concerns, Facilitate patient progression through stages of change regarding substance use diagnoses and concerns,  Identify triggers associated with mental health/substance abuse issues, and Increase skills for wellness and recovery  Therapeutic Interventions: Assess for all discharge needs, 1 to 1 time with Social worker, Explore available resources and support systems, Assess for adequacy in community support network, Educate family and significant other(s) on suicide prevention, Complete Psychosocial Assessment, Interpersonal group therapy.  Evaluation of Outcomes: Progressing   Progress in Treatment: Attending groups: Yes. Participating in groups: Yes. Taking medication as prescribed: Yes. Toleration medication: Yes. Family/Significant other contact made: Yes, individual(s) contacted:  DSS Patient understands diagnosis: Yes. Discussing patient identified problems/goals with staff: Yes. Medical problems stabilized or resolved: Yes. Denies suicidal/homicidal ideation: Yes. Issues/concerns per patient self-inventory: Yes. Other: N /A  New problem(s) identified: No, Describe:  none reported  New Short Term/Long Term Goal(s): Guardianship/DSS  Patient Goals:  Group Home placement  Discharge Plan or Barriers: CSW will continue to follow and assess for appropriate referrals and possible discharge planning.   Reason for Continuation of Hospitalization: Anxiety Depression Medication stabilization Suicidal ideation  Estimated Length of Stay: Unknown  Last 3 [place] Suicide Severity Risk Score: Flowsheet Row Ad[facility] (Current) [facility] from [facility]19/[facility]22 [facility] from 11/12/2022 in [facility] at [facility]  C-SSRS RISK CATEGORY High Risk High Risk Moderate Risk       Last [facility] 2/9 Scores:     No data to display           medication stabilization, elimination of SI thoughts, development of comprehensive mental wellness plan.   Scribe for Treatment  Team: Ane Payment, LCSW 03/13/2023 3:11 PM

## 2023-03-13 NOTE — Progress Notes (Signed)
   03/12/23 2137  Psych Admission Type (Psych Patients Only)  Admission Status Involuntary  Psychosocial Assessment  Patient Complaints None  Eye Contact Fair  Facial Expression Animated  Affect Appropriate to circumstance  Speech Logical/coherent  Interaction Childlike  Motor Activity Slow  Appearance/Hygiene Disheveled  Behavior Characteristics Cooperative  Mood Pleasant  Thought Process  Coherency WDL  Content WDL  Delusions None reported or observed  Perception WDL  Hallucination None reported or observed  Judgment Poor  Confusion None  Danger to Self  Current suicidal ideation? Denies  Self-Injurious Behavior No self-injurious ideation or behavior indicators observed or expressed   Agreement Not to Harm Self Yes  Description of Agreement verbal   Pt was offered support and encouragement. Pt was given scheduled medications. Given PRN Miralax per MAR. Pt was encouraged to attend groups. Q 15 minute checks were done for safety. Pt did not attend group, remained in bedroom resting. Interacts well with peers and staff. Pt has no complaints.Pt receptive to treatment and safety maintained on unit.

## 2023-03-13 NOTE — Group Note (Signed)
Recreation Therapy Group Note   Group Topic:Other  Group Date: 03/13/2023 Start Time: 1406 End Time: 1448 Facilitators: Yolanda [name], LRT,CTRS Location: 300 Hall Dayroom   Activity Description/Intervention: Therapeutic Drumming. Patients with peers and staff were given the opportunity to engage in a leader facilitated [facility] Group Empowerment Drumming Circle with staff from the [employer], in partnership with [employer]. Teaching laboratory technician and trained [employer], Theodoro Doing leading with LRT observing and documenting intervention and pt response. This evidenced-based practice targets 7 areas of health and wellbeing in the human experience including: stress-reduction, exercise, self-expression, camaraderie/support, nurturing, spirituality, and music-making (leisure).    Goal Area(s) Addresses:  Patient will engage in pro-social way in music group.  Patient will follow directions of drum leader on the first prompt. Patient will demonstrate no behavioral issues during group.  Patient will identify if a reduction in stress level occurs as a result of participation in therapeutic drum circle.     Education: Leisure exposure, Pharmacologist, Musical expression, Discharge Planning   Affect/Mood: N/A   Participation Level: Did not attend    Clinical Observations/Individualized Feedback:     Plan: Continue to engage patient in RT group sessions 2-3x/week.   Yolanda [name], LRT,CTRS 03/13/2023 3:37 PM

## 2023-03-13 NOTE — [facility] Group Notes (Signed)
Spiritual care group on grief and loss facilitated by Chaplain Dyanne Carrel, Bcc  Group Goal: Support / Education around grief and loss  Members engage in facilitated group support and psycho-social education.  Group Description:  Following introductions and group rules, group members engaged in facilitated group dialogue and support around topic of loss, with particular support around experiences of loss in their lives. Group Identified types of loss (relationships / self / things) and identified patterns, circumstances, and changes that precipitate losses. Reflected on thoughts / feelings around loss, normalized grief responses, and recognized variety in grief experience. Group encouraged individual reflection on safe space and on the coping skills that they are already utilizing.  Group drew on Adlerian / Rogerian and narrative framework  Patient Progress: Yolanda [name] attended group.  Participation was minimal, but she was actively engaged in the covnersation.

## 2023-03-13 NOTE — Plan of Care (Signed)
  Problem: Coping: Goal: Will verbalize feelings Outcome: Progressing   Problem: Self-Concept: Goal: Will verbalize positive feelings about self Outcome: Progressing   Problem: Medication: Goal: Compliance with prescribed medication regimen will improve Outcome: Progressing   Problem: Safety: Goal: Periods of time without injury will increase Outcome: Progressing

## 2023-03-13 NOTE — Progress Notes (Signed)
   03/13/23 1014  Psych Admission Type (Psych Patients Only)  Admission Status Involuntary  Psychosocial Assessment  Patient Complaints None  Eye Contact Fair  Facial Expression Flat  Affect Flat  Speech Logical/coherent  Interaction Childlike  Motor Activity Slow  Appearance/Hygiene Disheveled  Behavior Characteristics Cooperative  Mood Pleasant  Thought Process  Coherency WDL  Content WDL  Delusions None reported or observed  Perception WDL  Hallucination None reported or observed  Judgment Poor  Confusion None  Danger to Self  Current suicidal ideation? Denies  Self-Injurious Behavior No self-injurious ideation or behavior indicators observed or expressed   Agreement Not to Harm Self Yes  Description of Agreement verbal  Danger to Others  Danger to Others None reported or observed     03/13/23 1014  Psych Admission Type (Psych Patients Only)  Admission Status Involuntary  Psychosocial Assessment  Patient Complaints None  Eye Contact Fair  Facial Expression Flat  Affect Flat  Speech Logical/coherent  Interaction Childlike  Motor Activity Slow  Appearance/Hygiene Disheveled  Behavior Characteristics Cooperative  Mood Pleasant  Thought Process  Coherency WDL  Content WDL  Delusions None reported or observed  Perception WDL  Hallucination None reported or observed  Judgment Poor  Confusion None  Danger to Self  Current suicidal ideation? Denies  Self-Injurious Behavior No self-injurious ideation or behavior indicators observed or expressed   Agreement Not to Harm Self Yes  Description of Agreement verbal  Danger to Others  Danger to Others None reported or observed

## 2023-03-13 NOTE — Progress Notes (Signed)
Pt refused vitals 

## 2023-03-14 DIAGNOSIS — F251 Schizoaffective disorder, depressive type: Secondary | ICD-10-CM | POA: Diagnosis not present

## 2023-03-14 NOTE — [place] FL2 (Signed)
[place] MEDICAID FL2 LEVEL OF CARE FORM     IDENTIFICATION  Patient Name: Yolanda [name] Birthdate: 09-May-1976 Sex: female Admission Date (Current Location): 12/20/2022  [facility] [place] Number:  Producer, television/film/video and Address:  The . [facility], [address] [place], [place] [address]      Provider Number:    Attending Physician Name and Address:  Sarita Bottom, MD  Relative Name and Phone Number:  Diane Cusick [phone]    Current Level of Care: Hospital Recommended Level of Care: Other (Comment) (Group Home) Prior Approval Number:    Date Approved/Denied:   PASRR Number:    Discharge Plan: Other (Comment) (Group Home)    Current Diagnoses: Patient Active Problem List   Diagnosis Date Noted   Tobacco use disorder 01/01/2023   Bipolar 1 disorder, depressed (HCC) 11/13/2022   Suicide ideation 11/13/2022   Vitamin D deficiency 07/21/2022   Schizoaffective disorder (HCC) 07/18/2022   Trauma 01/17/2022   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   Pancreatic mass 07/31/2021   Self-cutting of wrist (HCC) 07/24/2020   Intellectual disability 07/23/2020   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   Non compliance w medication regimen 11/16/2019   Schizoaffective disorder, depressive type (HCC) 11/13/2019   GERD (gastroesophageal reflux disease) 06/03/2018    Orientation RESPIRATION BLADDER Height & Weight     Self, Time  Normal Incontinent Weight: 63 kg Height:  4\' 11"  (149.9 cm)  BEHAVIORAL SYMPTOMS/MOOD NEUROLOGICAL BOWEL NUTRITION STATUS      Continent    AMBULATORY STATUS COMMUNICATION OF NEEDS Skin   Independent Verbally Normal                       Personal Care Assistance Level of Assistance              Functional Limitations Info  Speech     Speech Info: Impaired    SPECIAL CARE FACTORS FREQUENCY                       Contractures       Additional Factors Info                  Current Medications (03/14/2023):  This is the current hospital active medication list Current Facility-Administered Medications  Medication Dose Route Frequency Provider Last Rate Last Admin   acetaminophen (TYLENOL) tablet 650 mg  650 mg Oral Q6H PRN [name], NP   650 mg at 03/11/23 2145   alum & mag hydroxide-simeth (MAALOX/MYLANTA) 200-200-20 MG/5ML suspension 30 mL  30 mL Oral Q4H PRN [name], NP   30 mL at 01/28/23 1530   busPIRone (BUSPAR) tablet 15 mg  15 mg Oral BID [name], NP   15 mg at 03/14/23 3235   cyanocobalamin (VITAMIN B12) injection 1,000 mcg  1,000 mcg Intramuscular Q30 days Sarita Bottom, MD   1,000 mcg at 03/04/23 1421   diphenhydrAMINE (BENADRYL) capsule 50 mg  50 mg Oral TID PRN [name], NP   50 mg at 01/15/23 1211   Or   diphenhydrAMINE (BENADRYL) injection 50 mg  50 mg Intramuscular TID PRN [name], NP       feeding supplement (ENSURE ENLIVE / ENSURE PLUS) liquid 237 mL  237 mL Oral TID BM Nkwenti, Doris, NP   237 mL at 03/14/23 0946   haloperidol (HALDOL) tablet 5 mg  5 mg  Oral TID PRN [name], NP   5 mg at 01/15/23 1211   Or   haloperidol lactate (HALDOL) injection 5 mg  5 mg Intramuscular TID PRN [name], NP       hydrOXYzine (ATARAX) tablet 25 mg  25 mg Oral TID PRN [name], DO   25 mg at 03/06/23 1816   LORazepam (ATIVAN) tablet 2 mg  2 mg Oral TID PRN [name], NP   2 mg at 01/15/23 1211   Or   LORazepam (ATIVAN) injection 2 mg  2 mg Intramuscular TID PRN [name], NP       magnesium hydroxide (MILK OF MAGNESIA) suspension 30 mL  30 mL Oral Daily PRN [name], NP   30 mL at 02/26/23 1828   melatonin tablet 5 mg  5 mg Oral QHS Nkwenti, Doris, NP   5 mg at 03/13/23 2156   methylphenidate (RITALIN) tablet 5 mg  5 mg Oral Daily Massengill, Nathan, MD   5 mg at 03/14/23 4782   nicotine (NICODERM CQ - dosed in mg/24 hours) patch 21 mg  21 mg Transdermal  Daily [name], DO   21 mg at 03/06/23 0819   paliperidone (INVEGA) 24 hr tablet 6 mg  6 mg Oral Daily [name], NP   6 mg at 03/13/23 2157   pantoprazole (PROTONIX) EC tablet 40 mg  40 mg Oral Daily [name], NP   40 mg at 03/14/23 9562   polyethylene glycol (MIRALAX / GLYCOLAX) packet 17 g  17 g Oral Daily PRN [name], NP   17 g at 03/12/23 1953   sertraline (ZOLOFT) tablet 150 mg  150 mg Oral Daily Massengill, Harrold Donath, MD   150 mg at 03/14/23 0940   traZODone (DESYREL) tablet 50 mg  50 mg Oral QHS [name], NP   50 mg at 03/13/23 2157   Vitamin D (Ergocalciferol) (DRISDOL) 1.25 MG (50000 UNIT) capsule 50,000 Units  50,000 Units Oral Q7 days [name], NP   50,000 Units at 03/09/23 1717     Discharge Medications: Please see discharge summary for a list of discharge medications.  Relevant Imaging Results:  Relevant Lab Results:   Additional Information    Flora Parks S Mahli Glahn, LCSW

## 2023-03-14 NOTE — Group Note (Signed)
Date:  03/14/2023 Time:  9:18 PM  Group Topic/Focus:  Wrap-Up Group:   The focus of this group is to help patients review their daily goal of treatment and discuss progress on daily workbooks.    Participation Level:  Did Not Attend   Lenore Cordia 03/14/2023, 9:18 PM

## 2023-03-14 NOTE — Progress Notes (Signed)
   03/13/23 2156  Psych Admission Type (Psych Patients Only)  Admission Status Involuntary  Psychosocial Assessment  Patient Complaints None  Eye Contact Fair  Facial Expression Flat  Affect Flat  Speech Logical/coherent  Interaction Childlike  Motor Activity Slow  Appearance/Hygiene Disheveled  Behavior Characteristics Cooperative  Mood Preoccupied;Pleasant  Thought Process  Coherency WDL  Content WDL  Delusions None reported or observed  Perception WDL  Hallucination None reported or observed  Judgment Poor  Confusion None  Danger to Self  Current suicidal ideation? Denies  Self-Injurious Behavior No self-injurious ideation or behavior indicators observed or expressed   Agreement Not to Harm Self Yes  Description of Agreement verbal  Danger to Others  Danger to Others None reported or observed   Pt was offered support and encouragement. Pt was given scheduled medications. Pt was encouraged to attend groups. Q 15 minute checks were done for safety. Pt did not attend group. Interacts well with staff and peers. Pt has no complaints.Pt receptive to treatment and safety maintained on unit.

## 2023-03-14 NOTE — Progress Notes (Signed)
   03/14/23 0559  15 Minute Checks  Location Bedroom  Visual Appearance Calm  Behavior Sleeping  Sleep (Behavioral Health Patients Only)  Calculate sleep? (Click Yes once per 24 hr at 0600 safety check) Yes  Documented sleep last 24 hours 8

## 2023-03-14 NOTE — Plan of Care (Signed)
  Problem: Coping: Goal: Will verbalize feelings Outcome: Progressing   Problem: Health Behavior/Discharge Planning: Goal: Compliance with therapeutic regimen will improve Outcome: Progressing   Problem: Self-Concept: Goal: Will verbalize positive feelings about self Outcome: Progressing   Problem: Safety: Goal: Periods of time without injury will increase Outcome: Progressing

## 2023-03-14 NOTE — Group Note (Signed)
LCSW Group Therapy Note   Group Date: 03/14/2023 Start Time: 1100 End Time: 1200   Type of Therapy and Topic:  Group Therapy - Coping Skills For Anxiety and Depression  Participation Level:  Did Not Attend   Description of Group The focus of this group was to determine what healthy coping techniques would be helpful for group members in coping with anxiety and depression in their daily lives. The group began with patients introducing themselves and revealing one healthy and one unhealthy way they have coped with anxiety or depression in the past. Patients were guided through different techniques for coping with anxiety in a healthy way, including deep breathing, progressive muscle relaxation, challenging irrational thoughts, and mental imagery. Patients were then guided though different techniques for coping with depression in a healthy way, including behavioral activation, increasing social supports, focusing on positive experiences, and mindfulness. Differences between healthy and unhealthy coping techniques were pointed out when brought up by group members. Patients were asked to identify 2-3 healthy coping skills they would like to learn to use more effectively after being guided through these different techniques.These were explained, samples demonstrated, and resources shared for how to learn more at discharge.  Therapeutic Goals Patients learned that coping is what human beings do to deal with various situations in their lives. Patients learned various healthy coping techniques for anxiety and depression Patients determined 2-3 healthy coping skills they would like to become more familiar with and use more often. Patients provided support and ideas to each other.   Summary of Patient Progress:   Pt did not attend   Therapeutic Modalities Cognitive Behavioral Therapy Motivational Interviewing Dialectical Behavioral Therapy  Yolanda Liverpool, LCSW 03/14/2023  2:05 PM

## 2023-03-14 NOTE — Progress Notes (Signed)
Pt up minimally, has not showered. Pt eating and drinking fluids. Medication compliant. Pt denies SI/HI.

## 2023-03-14 NOTE — Plan of Care (Signed)
  Problem: Education: Goal: Utilization of techniques to improve thought processes will improve Outcome: Progressing Goal: Knowledge of the prescribed therapeutic regimen will improve Outcome: Progressing   Problem: Activity: Goal: Interest or engagement in leisure activities will improve Outcome: Progressing Goal: Imbalance in normal sleep/wake cycle will improve Outcome: Progressing   Problem: Coping: Goal: Coping ability will improve Outcome: Progressing Goal: Will verbalize feelings Outcome: Progressing   Problem: Health Behavior/Discharge Planning: Goal: Ability to make decisions will improve Outcome: Progressing Goal: Compliance with therapeutic regimen will improve Outcome: Progressing   Problem: Role Relationship: Goal: Will demonstrate positive changes in social behaviors and relationships Outcome: Progressing   Problem: Safety: Goal: Ability to disclose and discuss suicidal ideas will improve Outcome: Progressing Goal: Ability to identify and utilize support systems that promote safety will improve Outcome: Progressing   Problem: Self-Concept: Goal: Will verbalize positive feelings about self Outcome: Progressing Goal: Level of anxiety will decrease Outcome: Progressing   Problem: Education: Goal: Utilization of techniques to improve thought processes will improve Outcome: Progressing Goal: Knowledge of the prescribed therapeutic regimen will improve Outcome: Progressing   Problem: Activity: Goal: Interest or engagement in leisure activities will improve Outcome: Progressing Goal: Imbalance in normal sleep/wake cycle will improve Outcome: Progressing   Problem: Coping: Goal: Coping ability will improve Outcome: Progressing Goal: Will verbalize feelings Outcome: Progressing   Problem: Health Behavior/Discharge Planning: Goal: Ability to make decisions will improve Outcome: Progressing Goal: Compliance with therapeutic regimen will  improve Outcome: Progressing   Problem: Role Relationship: Goal: Will demonstrate positive changes in social behaviors and relationships Outcome: Progressing   Problem: Safety: Goal: Ability to disclose and discuss suicidal ideas will improve Outcome: Progressing Goal: Ability to identify and utilize support systems that promote safety will improve Outcome: Progressing   Problem: Self-Concept: Goal: Will verbalize positive feelings about self Outcome: Progressing Goal: Level of anxiety will decrease Outcome: Progressing   Problem: Education: Goal: Ability to make informed decisions regarding treatment will improve Outcome: Progressing   Problem: Coping: Goal: Coping ability will improve Outcome: Progressing   Problem: Health Behavior/Discharge Planning: Goal: Identification of resources available to assist in meeting health care needs will improve Outcome: Progressing   Problem: Medication: Goal: Compliance with prescribed medication regimen will improve Outcome: Progressing   Problem: Self-Concept: Goal: Ability to disclose and discuss suicidal ideas will improve Outcome: Progressing Goal: Will verbalize positive feelings about self Outcome: Progressing   Problem: Education: Goal: Knowledge of [facility] Education information/materials will improve Outcome: Progressing Goal: Emotional status will improve Outcome: Progressing Goal: Mental status will improve Outcome: Progressing Goal: Verbalization of understanding the information provided will improve Outcome: Progressing   Problem: Activity: Goal: Interest or engagement in activities will improve Outcome: Progressing Goal: Sleeping patterns will improve Outcome: Progressing   Problem: Coping: Goal: Ability to verbalize frustrations and anger appropriately will improve Outcome: Progressing Goal: Ability to demonstrate self-control will improve Outcome: Progressing   Problem: Health  Behavior/Discharge Planning: Goal: Identification of resources available to assist in meeting health care needs will improve Outcome: Progressing Goal: Compliance with treatment plan for underlying cause of condition will improve Outcome: Progressing   Problem: Physical Regulation: Goal: Ability to maintain clinical measurements within normal limits will improve Outcome: Progressing   Problem: Safety: Goal: Periods of time without injury will increase Outcome: Progressing

## 2023-03-14 NOTE — Progress Notes (Signed)
[facility] MD Progress Note  03/14/2023 11:45 AM Yolanda [name]  MRN:  [id number]  Principal Problem: Schizoaffective disorder, depressive type (HCC)  Diagnosis: Principal Problem:   Schizoaffective disorder, depressive type (HCC) Active Problems:   GERD (gastroesophageal reflux disease)   Intellectual disability   Tobacco use disorder  Reason for admission: This is the first psychiatric admission in this [facility] in 12 years for this 48 AA female with an extensive hx of mental illnesses & probable polysubstance use disorders. She is admitted to the [name] from the [name] with complain of worsening suicidal ideations with plan to stab herself. Per chart review, patient apparently reported at the ED that she has been depressed for a while & has not been taking her mental health medications. After medical evaluation.clearance, she was transferred to the [facility] for further psychiatric evaluation/treatments.   24 hr chart review: Sleep Hours last night: 8.5. Nursing Concerns: None today. Behavioral episodes in the past 24 hrs: Stable.  Medication Compliance: Compliant  Vital Signs in the past 24 hrs: WNL PRN Medications in the past 24 hrs: None                                             Patient assessment note: Yolanda [name] is seen in her room, chart reviewed. The chart findings discussed with the treatment team. She was lying down in bed during this evaluation. She presents with a flat/restricted affect making a fair eye contact & verbally responsive. Yolanda [name] speaks in a slow monotonic voice. She reports, "I'm doing okay. I slept well last night. Do you know if they have found me a place yet? I'm fixing to get out of bed now. I stay in bed late in the mornings because I don't like to eat breakfast but I will be going to group therapy today". Yolanda [name] seem a bit subdued today during this evaluation. She was highly excited yesterday thinking that she will be discharged today because a place of residence  has been located for her. Patient did tell few of Yolanda [name] staff yesterday that she will bed discharged today being Thursday because the DSS has found her a place to stay. While telling this story yesterday, Yolanda [name] was excited & smiling ear to ear. However, when the SW was approached with this new information from Yolanda [name], the SW stated that the DSS is still searching for a place of residence for Yolanda [name]. That at the present time, there have not been any new changes in her situation. The attending psychiatrist did confirm what the SW said as well. At this time, Yolanda [name] continues to receive her mood stabilization treatments. She is encouraged to continue to participate in the group sessions. She currently denies any SIHI, AVH, delusional thoughts or paranoia. She does not appear to be responding to any internal stimuli. There are no behavioral issues reported by staff. The SW had indicated that there be a meeting between the SW, the attending psychiatrist & the DSS staff today for an update on the search for adequate residence/housing for Yolanda [name]. Reviewed current plan of care. There are no changes. Continue as already in progress. The vital signs remain stable.  Total Time spent with patient:  35 minutes  Past Psychiatric History: See H & P  Past Medical History:  Past Medical History:  Diagnosis Date   Bipolar affect, depressed (HCC)    Constipation 08/17/2022  Depression    Falls 07/21/2022   Fracture of femoral neck, right, closed (HCC) 01/17/2022   Herpes simplex 08/22/2017   Open fracture dislocation of right elbow joint 01/17/2022    Past Surgical History:  Procedure Laterality Date   NO PAST SURGERIES     SALPINGECTOMY     Family History: History reviewed. No pertinent family history.  Family Psychiatric  History: See H & P  Social History:  Social History   Substance and Sexual Activity  Alcohol Use Yes     Social History   Substance and Sexual Activity  Drug Use Yes    Types: Cocaine, Marijuana    Social History   Socioeconomic History   Marital status: Single    Spouse name: Not on file   Number of children: Not on file   Years of education: Not on file   Highest education level: Not on file  Occupational History   Not on file  Tobacco Use   Smoking status: Every Day   Smokeless tobacco: Not on file  Substance and Sexual Activity   Alcohol use: Yes   Drug use: Yes    Types: Cocaine, Marijuana   Sexual activity: Yes  Other Topics Concern   Not on file  Social History Narrative   Not on file   Social Determinants of Health   Financial Resource Strain: Low Risk  (09/12/2022)   [facility], [facility]   Overall Financial Resource Strain (CARDIA)    Difficulty of Paying Living Expenses: Not hard at all  Food Insecurity: Patient Declined (12/20/2022)   Hunger Vital Sign    Worried About Running Out of Food in the Last Year: Patient declined    Ran Out of Food in the Last Year: Patient declined  Transportation Needs: No Transportation Needs (12/20/2022)   PRAPARE - Administrator, Civil Service (Medical): No    Lack of Transportation (Non-Medical): No  Physical Activity: Not on file  Stress: No Stress Concern Present (07/17/2022)   Received from [name], [facility] Occupational [facility] - Occupational Stress Questionnaire    Feeling of Stress : Not at all  Social Connections: Unknown (07/16/2022)   Received from [facility], [facility]   Social Network    Social Network: Not on file   Sleep: Good  Appetite:  Good  Current Medications: Current Facility-Administered Medications  Medication Dose Route Frequency Provider Last Rate Last Admin   acetaminophen (TYLENOL) tablet 650 mg  650 mg Oral Q6H PRN [name], NP   650 mg at 03/11/23 2145   alum & mag hydroxide-simeth (MAALOX/MYLANTA) 200-200-20 MG/5ML suspension 30 mL  30 mL Oral Q4H PRN [name], NP   30 mL at  01/28/23 1530   busPIRone (BUSPAR) tablet 15 mg  15 mg Oral BID [name], NP   15 mg at 03/14/23 1610   cyanocobalamin (VITAMIN B12) injection 1,000 mcg  1,000 mcg Intramuscular Q30 days Sarita Bottom, MD   1,000 mcg at 03/04/23 1421   diphenhydrAMINE (BENADRYL) capsule 50 mg  50 mg Oral TID PRN [name], NP   50 mg at 01/15/23 1211   Or   diphenhydrAMINE (BENADRYL) injection 50 mg  50 mg Intramuscular TID PRN [name], NP       feeding supplement (ENSURE ENLIVE / ENSURE PLUS) liquid 237 mL  237 mL Oral TID BM Nkwenti, Doris, NP   237 mL at 03/14/23 0946   haloperidol (  HALDOL) tablet 5 mg  5 mg Oral TID PRN [name], NP   5 mg at 01/15/23 1211   Or   haloperidol lactate (HALDOL) injection 5 mg  5 mg Intramuscular TID PRN [name], NP       hydrOXYzine (ATARAX) tablet 25 mg  25 mg Oral TID PRN [name], DO   25 mg at 03/06/23 1816   LORazepam (ATIVAN) tablet 2 mg  2 mg Oral TID PRN [name], NP   2 mg at 01/15/23 1211   Or   LORazepam (ATIVAN) injection 2 mg  2 mg Intramuscular TID PRN [name], NP       magnesium hydroxide (MILK OF MAGNESIA) suspension 30 mL  30 mL Oral Daily PRN [name], NP   30 mL at 02/26/23 1828   melatonin tablet 5 mg  5 mg Oral QHS [name], NP   5 mg at 03/13/23 2156   methylphenidate (RITALIN) tablet 5 mg  5 mg Oral Daily Massengill, Harrold Donath, MD   5 mg at 03/14/23 1610   nicotine (NICODERM CQ - dosed in mg/24 hours) patch 21 mg  21 mg Transdermal Daily [name], DO   21 mg at 03/06/23 9604   paliperidone (INVEGA) 24 hr tablet 6 mg  6 mg Oral Daily [name], NP   6 mg at 03/13/23 2157   pantoprazole (PROTONIX) EC tablet 40 mg  40 mg Oral Daily [name], NP   40 mg at 03/14/23 5409   polyethylene glycol (MIRALAX / GLYCOLAX) packet 17 g  17 g Oral Daily PRN [name], NP   17 g at 03/12/23 1953   sertraline (ZOLOFT) tablet 150 mg  150 mg Oral Daily Massengill, Harrold Donath, MD   150 mg at 03/14/23 0940    traZODone (DESYREL) tablet 50 mg  50 mg Oral QHS [name], NP   50 mg at 03/13/23 2157   Vitamin D (Ergocalciferol) (DRISDOL) 1.25 MG (50000 UNIT) capsule 50,000 Units  50,000 Units Oral Q7 days [name], NP   50,000 Units at 03/09/23 1717   Lab Results:  Results for orders placed or performed during the [name] encounter of 12/20/22 (from the past 48 hour(s))  Comprehensive metabolic panel     Status: Abnormal   Collection Time: 03/12/23  6:21 PM  Result Value Ref Range   Sodium 138 135 - 145 mmol/L   Potassium 3.9 3.5 - 5.1 mmol/L   Chloride 102 98 - 111 mmol/L   CO2 26 22 - 32 mmol/L   Glucose, Bld 106 (H) 70 - 99 mg/dL    Comment: Glucose reference range applies only to samples taken after fasting for at least 8 hours.   BUN 18 6 - 20 mg/dL   Creatinine, Ser 8.11 0.44 - 1.00 mg/dL   Calcium 9.4 8.9 - 91.4 mg/dL   Total Protein 8.2 (H) 6.5 - 8.1 g/dL   Albumin 3.7 3.5 - 5.0 g/dL   AST 22 15 - 41 U/L   ALT 20 0 - 44 U/L   Alkaline Phosphatase 109 38 - 126 U/L   Total Bilirubin 0.2 (L) 0.3 - 1.2 mg/dL   GFR, Estimated >78 >29 mL/min    Comment: (NOTE) Calculated using the CKD-EPI Creatinine Equation (2021)    Anion gap 10 5 - 15    Comment: Performed at [name], [address], [place], [place] [address]  CBC with Differential/Platelet     Status: None  Collection Time: 03/12/23  6:21 PM  Result Value Ref Range   WBC 6.1 4.0 - 10.5 K/uL   RBC 4.51 3.87 - 5.11 MIL/uL   Hemoglobin 12.6 12.0 - 15.0 g/dL   HCT 65.7 84.6 - 96.2 %   MCV 89.8 80.0 - 100.0 fL   MCH 27.9 26.0 - 34.0 pg   MCHC 31.1 30.0 - 36.0 g/dL   RDW 95.2 84.1 - 32.4 %   Platelets 256 150 - 400 K/uL   nRBC 0.0 0.0 - 0.2 %   Neutrophils Relative % 50 %   Neutro Abs 3.0 1.7 - 7.7 K/uL   Lymphocytes Relative 37 %   Lymphs Abs 2.3 0.7 - 4.0 K/uL   Monocytes Relative 7 %   Monocytes Absolute 0.4 0.1 - 1.0 K/uL   Eosinophils Relative 5 %   Eosinophils Absolute 0.3 0.0 - 0.5  K/uL   Basophils Relative 1 %   Basophils Absolute 0.0 0.0 - 0.1 K/uL   Immature Granulocytes 0 %   Abs Immature Granulocytes 0.02 0.00 - 0.07 K/uL    Comment: Performed at [name], [address] [place], [place] [address]  Vitamin B12     Status: None   Collection Time: 03/12/23  6:21 PM  Result Value Ref Range   Vitamin B-12 874 180 - 914 pg/mL    Comment: (NOTE) This assay is not validated for testing neonatal or myeloproliferative syndrome specimens for Vitamin B12 levels. Performed at [facility], [address] [place], [place] [address]   VITAMIN D 25 Hydroxy (Vit-D Deficiency, Fractures)     Status: None   Collection Time: 03/12/23  6:21 PM  Result Value Ref Range   Vit D, 25-Hydroxy 37.50 30 - 100 ng/mL    Comment: (NOTE) Vitamin D deficiency has been defined by the Institute of Medicine  and an Endocrine Society practice guideline as a level of serum 25-OH  vitamin D less than 20 ng/mL (1,2). The Endocrine Society went on to  further define vitamin D insufficiency as a level between 21 and 29  ng/mL (2).  1. IOM (Institute of Medicine). 2010. Dietary reference intakes for  calcium and D. [place]: [employer]. 2. Holick MF, Binkley [place], Bischoff-Ferrari HA, et al. Evaluation,  treatment, and prevention of vitamin D deficiency: an Endocrine  Society clinical practice guideline, JCEM. 2011 Jul; 96(7): 1911-30.  Performed at [facility], [address] [place], [place] [address]    Blood Alcohol level:  Lab Results  Component Value Date   [facility] <10 12/19/2022   ETH <10 11/21/2019   Metabolic Disorder Labs: Lab Results  Component Value Date   HGBA1C 4.4 (L) 12/21/2022   MPG 79.58 12/21/2022   No results found for: "PROLACTIN" Lab Results  Component Value Date   CHOL 218 (H) 12/21/2022   TRIG 81 12/21/2022   HDL 53 12/21/2022   CHOLHDL 4.1 12/21/2022   VLDL 16 12/21/2022   LDLCALC  149 (H) 12/21/2022   LDLCALC 110 (H) 03/13/2011   Physical Findings: AIMS: Facial and Oral Movements Muscles of Facial Expression: None, normal Lips and Perioral Area: None, normal Jaw: None, normal Tongue: None, normal,Extremity Movements Upper (arms, wrists, hands, fingers): None, normal Lower (legs, knees, ankles, toes): None, normal, Trunk Movements Neck, shoulders, hips: None, normal, Overall Severity Severity of abnormal movements (highest score from questions above): None, normal Incapacitation due to abnormal movements: None, normal Patient's awareness of abnormal movements (rate  only patient's report): No Awareness, Dental Status Current problems with teeth and/or dentures?: No Does patient usually wear dentures?: No  CIWA:    COWS:    AIMS:0 Musculoskeletal: Strength & Muscle Tone: within normal limits Gait & Station: normal Patient leans: N/A  Psychiatric Specialty Exam:  Presentation  General Appearance:  Casual; Fairly Groomed  Eye Contact: Fair  Speech: Slow (slightly stuttering.)  Speech Volume: Decreased  Handedness: Right  Mood and Affect  Mood: Depressed (anxiously anticipating discharge date.)  Affect: Congruent  Thought Process  Thought Processes: Coherent; Goal Directed  Descriptions of Associations:Intact  Orientation:Partial  Thought Content:Logical  History of Schizophrenia/Schizoaffective disorder:Yes  Duration of Psychotic Symptoms:Greater than six months  Hallucinations:Hallucinations: None Description of Auditory Hallucinations: NA  Ideas of Reference: None  Suicidal Thoughts:Suicidal Thoughts: No SI Active Intent and/or Plan: Without Intent; Without Plan; Without Means to Carry Out; Without Access to Means SI Passive Intent and/or Plan: Without Intent; Without Plan; Without Means to Carry Out; Without Access to Means  Homicidal Thoughts:Homicidal Thoughts: No  Sensorium  Memory: Immediate Good; Recent Fair;  Remote Fair  Judgment: Fair  Insight: Fair  Art therapist  Concentration: Fair  Attention Span: Fair  Recall: Fair  Fund of Knowledge: -- (Limited)  Language: Fair  Psychomotor Activity  Psychomotor Activity: Psychomotor Activity: Decreased  Assets  Assets: Communication Skills; Desire for Improvement; Financial Resources/Insurance; Resilience; Social Support  Sleep  Sleep: Sleep: Good Number of Hours of Sleep: 8.5  Physical Exam: Physical Exam Vitals and nursing note reviewed.  Constitutional:      Appearance: Normal appearance.  HENT:     Nose: Nose normal.  Eyes:     Pupils: Pupils are equal, round, and reactive to light.  Pulmonary:     Effort: Pulmonary effort is normal.  Genitourinary:    Comments: Deferred Musculoskeletal:        General: Normal range of motion.     Cervical back: Normal range of motion.  Neurological:     Mental Status: She is alert and oriented to person, place, and time.    Review of Systems  Constitutional:  Negative for fever.  HENT:  Negative for hearing loss.   Eyes:  Negative for blurred vision.  Respiratory:  Negative for cough.   Cardiovascular:  Negative for chest pain.  Gastrointestinal:  Negative for heartburn.  Genitourinary:  Negative for dysuria.  Musculoskeletal:  Negative for myalgias.  Skin:  Negative for rash.  Neurological:  Negative for dizziness.  Psychiatric/Behavioral:  Positive for depression. Negative for hallucinations, memory loss, substance abuse and suicidal ideas. The patient is nervous/anxious and has insomnia.    Blood pressure 95/64, pulse 88, temperature 98.4 F (36.9 C), temperature source Oral, resp. rate 16, height 4\' 11"  (1.499 m), weight 63 kg, SpO2 100%. Body mass index is 28.05 kg/m.  Treatment Plan Summary: Daily contact with patient to assess and evaluate symptoms and progress in treatment and Medication management.    Still waiting on safe disposition as patient  would not be able to live independently given cognitive impairment. Recommend assisted living.    No medication side effects reported.  Because of urinary incontinence, recommended adult diapers to be worn.   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  Plan:  -Continue Colace 100 mg daily for constipation -Continue Ritalin 5 mg daily to help with energy &  wakefulness -Continue Vitamin D 50.000 units weekly for bone health. -Continue Sertraline150 mg po Q daily for depression/anxiety -Continue Buspar 15 mg po bid for anxiety.  -Continue paliperidone 6 mg po qd for mood stabilization -Continue Trazodone 50 mg nightly for insomnia  -Continue Nicoderm 21 mg topically Q 24 hrs for nicotine withdrawal management. -Continue vitamin B12 1000 mcg IM weekly for 4 weeks then monthly -Continue Melatonin 5 mg nightly for sleep -Continue Protonix EC 40 mg p.o. daily for GERD -Continue MiraLAX 17 g p.o. PRN for constipation -Continue hydroxyzine 25 mg p.o. 3 times daily as needed for anxiety -Continue Ensure nutritional shakes TID in between meals  -Previously discontinued Hydroxyzine 50 mg nightly-hypotension in the mornings   Safety and Monitoring: Voluntary admission to inpatient psychiatric unit for safety, stabilization and treatment Daily contact with patient to assess and evaluate symptoms and progress in treatment Patient's case to be discussed in multi-disciplinary team meeting Observation Level : q15 minute checks Vital signs: q12 hours Precautions: Safety   Discharge Planning: Social work and case management to assist with discharge planning and identification of [name] follow-up needs prior to discharge Estimated LOS: Unknown at this  time. Discharge Concerns: Need to establish a safety plan; Medication compliance and effectiveness Discharge Goals: Return home with outpatient referrals for mental health follow-up including medication management/psychotherapy No legal guardian, has payee  Armandina Stammer, NP, pmhnp, fnp-bc 03/14/2023, 11:45 AM Patient ID: Larey Brick, female   DOB: [birth year], 47 y.o.   MRN: [id number] Patient ID: ABBIGAYL WILLNER, female   DOB: 11-Dec-1975, 47 y.o.   MRN: [id number] Patient ID: DULCINEA AYAD, female   DOB: [birth year], 47 y.o.   MRN: [id number]

## 2023-03-15 DIAGNOSIS — F251 Schizoaffective disorder, depressive type: Secondary | ICD-10-CM | POA: Diagnosis not present

## 2023-03-15 NOTE — Progress Notes (Signed)
   03/15/23 1111  Psych Admission Type (Psych Patients Only)  Admission Status Involuntary  Psychosocial Assessment  Patient Complaints None  Eye Contact Fair  Facial Expression Flat  Affect Flat  Speech Logical/coherent  Interaction Childlike  Motor Activity Slow  Appearance/Hygiene Bizarre  Behavior Characteristics Cooperative  Mood Pleasant  Thought Process  Coherency WDL  Content WDL  Delusions None reported or observed  Perception WDL  Hallucination None reported or observed  Judgment Poor  Confusion None  Danger to Self  Current suicidal ideation? Denies  Agreement Not to Harm Self Yes  Description of Agreement verbal  Danger to Others  Danger to Others None reported or observed

## 2023-03-15 NOTE — [facility] Group Notes (Signed)
[facility] Group Notes:  (Nursing/MHT/Case Management/Adjunct)  Date:  03/15/2023  Time:  8:12 PM  Type of Therapy:   AA group  Participation Level:  Did Not Attend  Participation Quality:    Affect:    Cognitive:    Insight:    Engagement in Group:    Modes of Intervention:    Summary of Progress/Problems:  Noah Delaine 03/15/2023, 8:12 PM

## 2023-03-15 NOTE — Group Note (Signed)
Recreation Therapy Group Note   Group Topic:Team Building  Group Date: 03/15/2023 Start Time: 0930 End Time: 1000 Facilitators: Yarelli Decelles-McCall, LRT,CTRS Location: [address] Dayroom   Goal Area(s) Addresses:  Patient will effectively work with peer towards shared goal.  Patient will identify skills used to make activity successful.  Patient will identify how skills used during activity can be used to reach post d/c goals.   Intervention: STEM Activity  Group Description:  Straw Bridge. In teams of 3-5, patients were given 15 plastic drinking straws and an equal length of masking tape. Using the materials provided, patients were instructed to build a free standing bridge-like structure to suspend an everyday item (ex: puzzle box) off of the floor or table surface. All materials were required to be used by the team in their design. LRT facilitated post-activity discussion reviewing team process. Patients were encouraged to reflect how the skills used in this activity can be generalized to daily life post discharge.    Affect/Mood: N/A   Participation Level: Did not attend    Clinical Observations/Individualized Feedback:     Plan: Continue to engage patient in RT group sessions 2-3x/week.   Stephenie Navejas-McCall, LRT,CTRS  03/15/2023 11:57 AM

## 2023-03-15 NOTE — Progress Notes (Signed)
[facility] MD Progress Note  03/15/2023 11:52 AM Yolanda [name]  MRN:  [id number]  Principal Problem: Schizoaffective disorder, depressive type (HCC)  Diagnosis: Principal Problem:   Schizoaffective disorder, depressive type (HCC) Active Problems:   GERD (gastroesophageal reflux disease)   Intellectual disability   Tobacco use disorder  Reason for admission: This [name] the first psychiatric admission in this [facility] in 12 years for this 47 AA female with an extensive hx of mental illnesses & probable polysubstance use disorders. She [name] admitted to the [facility] from the [facility] hospital with complain of worsening suicidal ideations with plan to stab herself. Per chart review, patient apparently reported at the ED that she has been depressed for a while & has not been taking her mental health medications. After medical evaluation.clearance, she was transferred to the [facility] for further psychiatric evaluation/treatments.   24 hr chart review: Sleep Hours last night: 7.5 Nursing Concerns: None today. Behavioral episodes in the past 24 hrs: Stable.  Medication Compliance: Compliant  Vital Signs in the past 24 hrs: WNL PRN Medications in the past 24 hrs: None                                             Patient assessment note:Yolanda [name] [name] seen in her room, chart reviewed. The chart findings discussed with the treatment team. She was lying down in bed during this evaluation. She presents with a flat affect. She [name] making a fair eye contact & verbally responsive. Dicy continues to speaks in a slow monotonic voice, sometimes has to repeat herself because her speech at times [name] garbled or stuttered. She reports, "I'm upset about yesterday's meeting with the D[employer] workers. I had high hope for some good news but it did not happen. I thought that something will work-out. But I'm okay. I want to tell you that I do not want to take that Ritalin medicine. I don't know why I should take it because I don't like how it makes me  feel. I don't have a problem with my other medicines". Yolanda [name] instructed & encouraged to continue to have patience about her placement & wait it out. Hopefully, she will get her good news of a new residence for her soon. She currently denies nay SIHI, AVH, delusional thoughts or paranoia. She does not appear to be responding to any internal stimuli. And because patient has stressed the need to not take Ritalin, she [name] promised that it will be discontinued. Prior to discontinuing this medicine, patient was briefed on the reasons why she was started on Ritalin. She replied, "I just don't like how it made me feel. It [name] hard for me to explain it, but I don't want to take that medicine". Yolanda [name] continues to take her other medications, denies any side effects. She [name] encouraged to continue to attend & participate in the group therapy sessions. There are no other changes made on her current plan of care. Vital signs remain stable. Vital signs remain stable.  Total Time spent with patient:  35 minutes  Past Psychiatric History: See H & P  Past Medical History:  Past Medical History:  Diagnosis Date   Bipolar affect, depressed (HCC)    Constipation 08/17/2022   Depression    Falls 07/21/2022   Fracture of femoral neck, right, closed (HCC) 01/17/2022   Herpes simplex 08/22/2017   Open fracture  dislocation of right elbow joint 01/17/2022    Past Surgical History:  Procedure Laterality Date   NO PAST SURGERIES     SALPINGECTOMY     Family History: History reviewed. No pertinent family history.  Family Psychiatric  History: See H & P  Social History:  Social History   Substance and Sexual Activity  Alcohol Use Yes     Social History   Substance and Sexual Activity  Drug Use Yes   Types: Cocaine, Marijuana    Social History   Socioeconomic History   Marital status: Single    Spouse name: Not on file   Number of children: Not on file   Years of education: Not on file   Highest  education level: Not on file  Occupational History   Not on file  Tobacco Use   Smoking status: Every Day   Smokeless tobacco: Not on file  Substance and Sexual Activity   Alcohol use: Yes   Drug use: Yes    Types: Cocaine, Marijuana   Sexual activity: Yes  Other Topics Concern   Not on file  Social History Narrative   Not on file   Social Determinants of Health   Financial Resource Strain: Low Risk  (09/12/2022)   Received from [facility], [facility]   Overall Financial Resource Strain (CARDIA)    Difficulty of Paying Living Expenses: Not hard at all  Food Insecurity: Patient Declined (12/20/2022)   Hunger Vital Sign    Worried About Running Out of Food in the Last Year: Patient declined    Ran Out of Food in the Last Year: Patient declined  Transportation Needs: No Transportation Needs (12/20/2022)   PRAPARE - Administrator, Civil Service (Medical): No    Lack of Transportation (Non-Medical): No  Physical Activity: Not on file  Stress: No Stress Concern Present (07/17/2022)   Received from [facility] Occupational Health - Occupational Stress Questionnaire    Feeling of Stress : Not at all  Social Connections: Unknown (07/16/2022)   Received from [facility], [facility]   Social Network    Social Network: Not on file   Sleep: Good  Appetite:  Good  Current Medications: Current Facility-Administered Medications  Medication Dose Route Frequency Provider Last Rate Last Admin   acetaminophen (TYLENOL) tablet 650 mg  650 mg Oral Q6H PRN [name], NP   650 mg at 03/11/23 2145   alum & mag hydroxide-simeth (MAALOX/MYLANTA) 200-200-20 MG/5ML suspension 30 mL  30 mL Oral Q4H PRN [name], NP   30 mL at 01/28/23 1530   busPIRone (BUSPAR) tablet 15 mg  15 mg Oral BID [name], NP   15 mg at 03/15/23 1610   cyanocobalamin (VITAMIN B12) injection 1,000 mcg  1,000 mcg Intramuscular Q30 days [name], Nadir, MD    1,000 mcg at 03/04/23 1421   diphenhydrAMINE (BENADRYL) capsule 50 mg  50 mg Oral TID PRN [name], NP   50 mg at 01/15/23 1211   Or   diphenhydrAMINE (BENADRYL) injection 50 mg  50 mg Intramuscular TID PRN [name], NP       feeding supplement (ENSURE ENLIVE / ENSURE PLUS) liquid 237 mL  237 mL Oral TID BM Nkwenti, Doris, NP   237 mL at 03/15/23 0840   haloperidol (HALDOL) tablet 5 mg  5 mg Oral TID PRN [name], NP   5 mg at 01/15/23 1211   Or  haloperidol lactate (HALDOL) injection 5 mg  5 mg Intramuscular TID PRN [name], NP       hydrOXYzine (ATARAX) tablet 25 mg  25 mg Oral TID PRN [name], DO   25 mg at 03/06/23 1816   LORazepam (ATIVAN) tablet 2 mg  2 mg Oral TID PRN [name], NP   2 mg at 01/15/23 1211   Or   LORazepam (ATIVAN) injection 2 mg  2 mg Intramuscular TID PRN [name], NP       magnesium hydroxide (MILK OF MAGNESIA) suspension 30 mL  30 mL Oral Daily PRN [name], NP   30 mL at 02/26/23 1828   melatonin tablet 5 mg  5 mg Oral QHS Nkwenti, Doris, NP   5 mg at 03/14/23 2109   methylphenidate (RITALIN) tablet 5 mg  5 mg Oral Daily Massengill, Nathan, MD   5 mg at 03/14/23 3244   nicotine (NICODERM CQ - dosed in mg/24 hours) patch 21 mg  21 mg Transdermal Daily [name], DO   21 mg at 03/06/23 0819   paliperidone (INVEGA) 24 hr tablet 6 mg  6 mg Oral Daily [name], NP   6 mg at 03/14/23 2109   pantoprazole (PROTONIX) EC tablet 40 mg  40 mg Oral Daily [name], NP   40 mg at 03/15/23 0839   polyethylene glycol (MIRALAX / GLYCOLAX) packet 17 g  17 g Oral Daily PRN [name], NP   17 g at 03/12/23 1953   sertraline (ZOLOFT) tablet 150 mg  150 mg Oral Daily Massengill, Harrold Donath, MD   150 mg at 03/15/23 0840   traZODone (DESYREL) tablet 50 mg  50 mg Oral QHS [name], NP   50 mg at 03/14/23 2109   Vitamin D (Ergocalciferol) (DRISDOL) 1.25 MG (50000 UNIT) capsule 50,000 Units  50,000 Units Oral Q7 days [name], NP   50,000 Units at 03/09/23 1717   Lab Results:  No results found for this or any previous visit (from the past 48 hour(s)).  Blood Alcohol level:  Lab Results  Component Value Date   ETH <10 12/19/2022   ETH <10 11/21/2019   Metabolic Disorder Labs: Lab Results  Component Value Date   HGBA1C 4.4 (L) 12/21/2022   MPG 79.58 12/21/2022   No results found for: "PROLACTIN" Lab Results  Component Value Date   CHOL 218 (H) 12/21/2022   TRIG 81 12/21/2022   HDL 53 12/21/2022   CHOLHDL 4.1 12/21/2022   VLDL 16 12/21/2022   LDLCALC 149 (H) 12/21/2022   LDLCALC 110 (H) 03/13/2011   Physical Findings: AIMS: Facial and Oral Movements Muscles of Facial Expression: None, normal Lips and Perioral Area: None, normal Jaw: None, normal Tongue: None, normal,Extremity Movements Upper (arms, wrists, hands, fingers): None, normal Lower (legs, knees, ankles, toes): None, normal, Trunk Movements Neck, shoulders, hips: None, normal, Overall Severity Severity of abnormal movements (highest score from questions above): None, normal Incapacitation due to abnormal movements: None, normal Patient's awareness of abnormal movements (rate only patient's report): No Awareness, Dental Status Current problems with teeth and/or dentures?: No Does patient usually wear dentures?: No  CIWA:    COWS:    AIMS:0 Musculoskeletal: Strength & Muscle Tone: within normal limits Gait & Station: normal Patient leans: N/A  Psychiatric Specialty Exam:  Presentation  General Appearance:  Casual; Fairly Groomed  Eye Contact: Fair  Speech: Slow (slightly stuttering.)  Speech Volume: Decreased  Handedness: Right  Mood and Affect  Mood: Depressed (anxiously anticipating discharge date.)  Affect: Congruent  Thought Process  Thought Processes: Coherent; Goal Directed  Descriptions of Associations:Intact  Orientation:Partial  Thought Content:Logical  History of  Schizophrenia/Schizoaffective disorder:Yes  Duration of Psychotic Symptoms:Greater than six months  Hallucinations:Hallucinations: None Description of Auditory Hallucinations: NA  Ideas of Reference: None  Suicidal Thoughts:Suicidal Thoughts: No SI Active Intent and/or Plan: Without Intent; Without Plan; Without Means to Carry Out; Without Access to Means SI Passive Intent and/or Plan: Without Intent; Without Plan; Without Means to Carry Out; Without Access to Means  Homicidal Thoughts:Homicidal Thoughts: No  Sensorium  Memory: Immediate Good; Recent Fair; Remote Fair  Judgment: Fair  Insight: Fair  Art therapist  Concentration: Fair  Attention Span: Fair  Recall: Fair  Fund of Knowledge: -- (Limited)  Language: Fair  Psychomotor Activity  Psychomotor Activity: Psychomotor Activity: Decreased  Assets  Assets: Communication Skills; Desire for Improvement; Financial Resources/Insurance; Resilience; Social Support  Sleep  Sleep: Sleep: Good Number of Hours of Sleep: 7.5  Physical Exam: Physical Exam Vitals and nursing note reviewed.  Constitutional:      Appearance: Normal appearance.  HENT:     Nose: Nose normal.  Eyes:     Pupils: Pupils are equal, round, and reactive to light.  Pulmonary:     Effort: Pulmonary effort [name] normal.  Genitourinary:    Comments: Deferred Musculoskeletal:        General: Normal range of motion.     Cervical back: Normal range of motion.  Neurological:     Mental Status: She [name] alert and oriented to person, place, and time.    Review of Systems  Constitutional:  Negative for fever.  HENT:  Negative for hearing loss.   Eyes:  Negative for blurred vision.  Respiratory:  Negative for cough.   Cardiovascular:  Negative for chest pain.  Gastrointestinal:  Negative for heartburn.  Genitourinary:  Negative for dysuria.  Musculoskeletal:  Negative for myalgias.  Skin:  Negative for rash.  Neurological:   Negative for dizziness.  Psychiatric/Behavioral:  Positive for depression. Negative for hallucinations, memory loss, substance abuse and suicidal ideas. The patient [name] nervous/anxious and has insomnia.    Blood pressure 102/72, pulse 97, temperature 97.8 F (36.6 C), temperature source Oral, resp. rate 16, height 4\' 11"  (1.499 m), weight 63 kg, SpO2 97%. Body mass index [name] 28.05 kg/m.  Treatment Plan Summary: Daily contact with patient to assess and evaluate symptoms and progress in treatment and Medication management.    Still waiting on safe disposition as patient would not be able to live independently given cognitive impairment. Recommend assisted living.    No medication side effects reported.  Because of urinary incontinence, recommended adult diapers to be worn.   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  Plan:  -Continue Colace 100 mg daily for constipation -Discontinued Ritalin 5 mg daily to help with energy & wakefulness. Pt refuses to take. -Continue Vitamin D 50.000 units weekly for bone health. -Continue Sertraline150 mg po Q daily for depression/anxiety -Continue Buspar 15 mg po bid for anxiety.  -Continue paliperidone 6 mg po qd for mood stabilization -Continue Trazodone 50 mg nightly for insomnia  -Continue Nicoderm 21 mg topically Q 24 hrs for nicotine withdrawal management. -Continue vitamin B12 1000 mcg IM weekly for 4 weeks then monthly -Continue Melatonin 5 mg nightly for sleep -  Continue Protonix EC 40 mg p.o. daily for GERD -Continue MiraLAX 17 g p.o. PRN for constipation -Continue hydroxyzine 25 mg p.o. 3 times daily as needed for anxiety -Continue Ensure nutritional shakes TID in between meals  -Previously discontinued  Hydroxyzine 50 mg nightly-hypotension in the mornings   Safety and Monitoring: Voluntary admission to inpatient psychiatric unit for safety, stabilization and treatment Daily contact with patient to assess and evaluate symptoms and progress in treatment Patient's case to be discussed in multi-disciplinary team meeting Observation Level : q15 minute checks Vital signs: q12 hours Precautions: Safety   Discharge Planning: Social work and case management to assist with discharge planning and identification of hospital follow-up needs prior to discharge Estimated LOS: Unknown at this time. Discharge Concerns: Need to establish a safety plan; Medication compliance and effectiveness Discharge Goals: Return home with outpatient referrals for mental health follow-up including medication management/psychotherapy No legal guardian, has payee  Armandina Stammer, NP, pmhnp, fnp-bc 03/15/2023, 11:52 AM Patient ID: Yolanda [name], female   DOB: 10-14-1975, 47 y.o.   MRN: [id number] Patient ID: Yolanda [name], female   DOB: 08/21/1975, 47 y.o.   MRN: [id number] Patient ID: Yolanda [name], female   DOB: 05-29-1976, 47 y.o.   MRN: [id number] Patient ID: Yolanda [name], female   DOB: 24-Mar-1976, 48 y.o.   MRN: [id number]

## 2023-03-15 NOTE — Progress Notes (Signed)
   03/15/23 2355  Psych Admission Type (Psych Patients Only)  Admission Status Involuntary  Psychosocial Assessment  Patient Complaints Anxiety;Depression;Crying spells  Eye Contact Fair  Facial Expression Anxious;Animated  Affect Appropriate to circumstance  Speech Logical/coherent;Soft  Interaction Childlike  Motor Activity Slow  Appearance/Hygiene Bizarre  Behavior Characteristics Appropriate to situation  Mood Pleasant  Thought Process  Coherency WDL  Content WDL  Delusions None reported or observed  Perception WDL  Hallucination None reported or observed  Judgment Poor  Confusion None  Danger to Self  Current suicidal ideation? Denies  Self-Injurious Behavior No self-injurious ideation or behavior indicators observed or expressed   Agreement Not to Harm Self Yes  Description of Agreement verbal  Danger to Others  Danger to Others None reported or observed

## 2023-03-15 NOTE — Group Note (Signed)
Date:  03/15/2023 Time:  9:07 AM  Group Topic/Focus:  Goals Group:   The focus of this group is to help patients establish daily goals to achieve during treatment and discuss how the patient can incorporate goal setting into their daily lives to aide in recovery. Orientation:   The focus of this group is to educate the patient on the purpose and policies of crisis stabilization and provide a format to answer questions about their admission.  The group details unit policies and expectations of patients while admitted.    Participation Level:  Minimal  Participation Quality:  Appropriate  Affect:  Appropriate  Cognitive:  Appropriate  Insight: Appropriate  Engagement in Group:  Limited  Modes of Intervention:  Discussion  Additional Comments:  Pt left group before self-care score  Jaquita Rector 03/15/2023, 9:07 AM

## 2023-03-15 NOTE — Progress Notes (Signed)
   03/14/23 2109  Psych Admission Type (Psych Patients Only)  Admission Status Involuntary  Psychosocial Assessment  Patient Complaints None  Eye Contact Fair  Facial Expression Flat  Affect Flat  Speech Logical/coherent  Interaction Childlike  Motor Activity Slow  Appearance/Hygiene Disheveled  Behavior Characteristics Cooperative  Mood Pleasant  Thought Process  Coherency WDL  Content WDL  Delusions None reported or observed  Perception WDL  Hallucination None reported or observed  Judgment Poor  Confusion None  Danger to Self  Current suicidal ideation? Denies  Self-Injurious Behavior No self-injurious ideation or behavior indicators observed or expressed   Agreement Not to Harm Self Yes  Description of Agreement verbal  Danger to Others  Danger to Others None reported or observed

## 2023-03-15 NOTE — Progress Notes (Signed)
Chaplain met with Yolanda [name] at her request.  Yolanda [name] was very tearful and feels that she must have done something very wrong to still be here after 3 months.  She is very disappointed because she thought she was going to be able to leave the hospital.  She also fears that maybe she is being punished because she hasn't done enough Bible study or because she is not calling God by the right name.  (She was confused because one Bible she has said "Lord" and another said "God" and she didn't know which name to use.  Chaplain affirmed that her time here is not a punishment, nor did she do anything to deserve her diagnosis and disabilities. Chaplain provided spiritual support and encouraged her to use both names for God if she was worried about using the wrong one.  She was delighted with this and felt somewhat better by the end of the conversation.

## 2023-03-16 DIAGNOSIS — F251 Schizoaffective disorder, depressive type: Secondary | ICD-10-CM | POA: Diagnosis not present

## 2023-03-16 NOTE — Plan of Care (Signed)
  Problem: Education: Goal: Utilization of techniques to improve thought processes will improve Outcome: Progressing   Problem: Coping: Goal: Coping ability will improve Outcome: Progressing   

## 2023-03-16 NOTE — Progress Notes (Addendum)
D. Pt presented with a sad affect/depressed mood-calm and cooperative, but somewhat withdrawn today. Pt remained in her room for most of the morning. Pt currently denies SI/HI and AVH and does not appear to be responding to internal stimuli.  A. Labs and vitals monitored. Pt given and educated on medications. Pt supported emotionally and encouraged to express concerns and ask questions.   R. Pt remains safe with 15 minute checks. Will continue POC.    03/16/23 1100  Psych Admission Type (Psych Patients Only)  Admission Status Involuntary  Psychosocial Assessment  Patient Complaints Sadness  Eye Contact Fair  Facial Expression Animated  Affect Appropriate to circumstance  Speech Soft;Logical/coherent  Interaction Childlike  Motor Activity Slow  Appearance/Hygiene Disheveled  Behavior Characteristics Appropriate to situation;Cooperative  Mood Depressed;Pleasant  Thought Process  Coherency WDL  Content WDL  Delusions None reported or observed  Perception WDL  Hallucination None reported or observed  Judgment Poor  Confusion None  Danger to Self  Current suicidal ideation? Denies  Self-Injurious Behavior No self-injurious ideation or behavior indicators observed or expressed   Agreement Not to Harm Self Yes  Description of Agreement verbal contract for safety  Danger to Others  Danger to Others None reported or observed

## 2023-03-16 NOTE — Group Note (Signed)
Date:  03/16/2023 Time:  8:48 PM  Group Topic/Focus:  Wrap-Up Group:   The focus of this group is to help patients review their daily goal of treatment and discuss progress on daily workbooks.    Participation Level:  Did Not Attend  Participation Quality:   n/a  Affect:   n/a  Cognitive:   n/a  Insight: None  Engagement in Group:  None  Modes of Intervention:   n/a  Additional Comments:  Patient did not attend group.  Kennieth Francois 03/16/2023, 8:48 PM

## 2023-03-16 NOTE — Progress Notes (Signed)
   03/16/23 2000  Psych Admission Type (Psych Patients Only)  Admission Status Involuntary  Psychosocial Assessment  Patient Complaints None  Eye Contact Fair  Facial Expression Animated  Affect Appropriate to circumstance  Speech Soft;Logical/coherent  Interaction Childlike  Motor Activity Slow  Appearance/Hygiene Disheveled  Behavior Characteristics Cooperative;Appropriate to situation  Mood Depressed;Pleasant  Thought Process  Coherency WDL  Content WDL  Delusions None reported or observed  Perception WDL  Hallucination None reported or observed  Judgment Poor  Confusion None  Danger to Self  Current suicidal ideation? Denies  Self-Injurious Behavior No self-injurious ideation or behavior indicators observed or expressed   Agreement Not to Harm Self Yes  Description of Agreement verbal contract for safety  Danger to Others  Danger to Others None reported or observed

## 2023-03-16 NOTE — Group Note (Signed)
Date:  03/16/2023 Time:  10:05 AM  Group Topic/Focus:  Goals Group:   The focus of this group is to help patients establish daily goals to achieve during treatment and discuss how the patient can incorporate goal setting into their daily lives to aide in recovery.    Participation Level:  Did Not Attend  Deforest Hoyles [facility] 03/16/2023, 10:05 AM

## 2023-03-16 NOTE — Plan of Care (Signed)
  Problem: Education: Goal: Utilization of techniques to improve thought processes will improve Outcome: Progressing Goal: Knowledge of the prescribed therapeutic regimen will improve Outcome: Progressing   Problem: Activity: Goal: Interest or engagement in leisure activities will improve Outcome: Progressing   

## 2023-03-16 NOTE — Progress Notes (Signed)
[facility] MD Progress Note  03/16/2023 3:40 PM Yolanda [name]  MRN:  [id number]  Principal Problem: Schizoaffective disorder, depressive type (HCC)  Diagnosis: Principal Problem:   Schizoaffective disorder, depressive type (HCC) Active Problems:   GERD (gastroesophageal reflux disease)   Intellectual disability   Tobacco use disorder  Reason for admission: This is the first psychiatric admission in this [facility] in 12 years for this 57 AA female with an extensive hx of mental illnesses & probable polysubstance use disorders. She is admitted to the [facility] from the [facility] hospital with complain of worsening suicidal ideations with plan to stab herself. Per chart review, patient apparently reported at the ED that she has been depressed for a while & has not been taking her mental health medications. After medical evaluation.clearance, she was transferred to the [facility] for further psychiatric evaluation/treatments.   24 hr chart review: Sleep Hours last night: 7.5 Nursing Concerns: None today. Behavioral episodes in the past 24 hrs: Stable.  Medication Compliance: Compliant  Vital Signs in the past 24 hrs: WNL PRN Medications in the past 24 hrs: None                                             Patient assessment note: Yolanda [name] is seen in her room, chart reviewed. The chart findings discussed with the treatment team. She was lying down in bed during this evaluation. She presents with a flat/depressed affect. She is making a fair eye contact & verbally responsive. Yolanda [name] continues to speaks in a slow monotonic voice, sometimes has to repeat herself because her speech at times is garbled or stuttered. She reports, "I'm upset because I'm still in the hospital. I need to be discharged so I can go up there to the social services (DSS) myself to ask for a place to stay. If I can get up there, they might be able to do something, but they would not put me in a homeless shelter. I'm tired of being here". At this  time, it is obvious that Yolanda [name] is getting frustrated about her living situation. However, she does respond very well to encouragement to continue to be patient. Patient's patience is now getting too thin. Explained to Yolanda [name] that she is just not going to be discharged to go to the DSS office because the DSS is aware that she needs housing & are currently working on finding her a home. She currently denies any SIHI, AVH, delusional thoughts or paranoia. She does not appear to be responding to any internal stimuli. She continues to be compliant to her treatment regimen. Denies any side effects. There are no changes made on her current plan of care. The Ritalin was discontinued yesterday because she declines to take it. Continue current plan of care as already in progress.  Total Time spent with patient:  35 minutes  Past Psychiatric History: See H & P  Past Medical History:  Past Medical History:  Diagnosis Date   Bipolar affect, depressed (HCC)    Constipation 08/17/2022   Depression    Falls 07/21/2022   Fracture of femoral neck, right, closed (HCC) 01/17/2022   Herpes simplex 08/22/2017   Open fracture dislocation of right elbow joint 01/17/2022    Past Surgical History:  Procedure Laterality Date   NO PAST SURGERIES     SALPINGECTOMY     Family History: History reviewed. No  pertinent family history.  Family Psychiatric  History: See H & P  Social History:  Social History   Substance and Sexual Activity  Alcohol Use Yes     Social History   Substance and Sexual Activity  Drug Use Yes   Types: Cocaine, Marijuana    Social History   Socioeconomic History   Marital status: Single    Spouse name: Not on file   Number of children: Not on file   Years of education: Not on file   Highest education level: Not on file  Occupational History   Not on file  Tobacco Use   Smoking status: Every Day   Smokeless tobacco: Not on file  Substance and Sexual Activity   Alcohol  use: Yes   Drug use: Yes    Types: Cocaine, Marijuana   Sexual activity: Yes  Other Topics Concern   Not on file  Social History Narrative   Not on file   Social Determinants of Health   Financial Resource Strain: Low Risk  (09/12/2022)   Received from [facility], [facility]   Overall Financial Resource Strain (CARDIA)    Difficulty of Paying Living Expenses: Not hard at all  Food Insecurity: Patient Declined (12/20/2022)   Hunger Vital Sign    Worried About Running Out of Food in the Last Year: Patient declined    Ran Out of Food in the Last Year: Patient declined  Transportation Needs: No Transportation Needs (12/20/2022)   PRAPARE - Administrator, Civil Service (Medical): No    Lack of Transportation (Non-Medical): No  Physical Activity: Not on file  Stress: No Stress Concern Present (07/17/2022)   Received from [facility], [facility] of Occupational Health - Occupational Stress Questionnaire    Feeling of Stress : Not at all  Social Connections: Unknown (07/16/2022)   Received from [facility], [facility]   Social Network    Social Network: Not on file   Sleep: Good  Appetite:  Good  Current Medications: Current Facility-Administered Medications  Medication Dose Route Frequency Provider Last Rate Last Admin   acetaminophen (TYLENOL) tablet 650 mg  650 mg Oral Q6H PRN [name], NP   650 mg at 03/11/23 2145   alum & mag hydroxide-simeth (MAALOX/MYLANTA) 200-200-20 MG/5ML suspension 30 mL  30 mL Oral Q4H PRN [name], NP   30 mL at 01/28/23 1530   busPIRone (BUSPAR) tablet 15 mg  15 mg Oral BID [name], NP   15 mg at 03/16/23 1610   cyanocobalamin (VITAMIN B12) injection 1,000 mcg  1,000 mcg Intramuscular Q30 days [name], Nadir, MD   1,000 mcg at 03/04/23 1421   diphenhydrAMINE (BENADRYL) capsule 50 mg  50 mg Oral TID PRN [name], NP   50 mg at 01/15/23 1211   Or   diphenhydrAMINE (BENADRYL) injection 50 mg   50 mg Intramuscular TID PRN [name], NP       feeding supplement (ENSURE ENLIVE / ENSURE PLUS) liquid 237 mL  237 mL Oral TID BM Nkwenti, Doris, NP   237 mL at 03/16/23 1518   haloperidol (HALDOL) tablet 5 mg  5 mg Oral TID PRN [name], NP   5 mg at 01/15/23 1211   Or   haloperidol lactate (HALDOL) injection 5 mg  5 mg Intramuscular TID PRN [name], NP       hydrOXYzine (ATARAX) tablet 25 mg  25 mg Oral TID PRN Cyndie Chime,  Raynelle Fanning, DO   25 mg at 03/15/23 2100   LORazepam (ATIVAN) tablet 2 mg  2 mg Oral TID PRN [name], NP   2 mg at 01/15/23 1211   Or   LORazepam (ATIVAN) injection 2 mg  2 mg Intramuscular TID PRN [name], NP       magnesium hydroxide (MILK OF MAGNESIA) suspension 30 mL  30 mL Oral Daily PRN [name], NP   30 mL at 02/26/23 1828   melatonin tablet 5 mg  5 mg Oral QHS Nkwenti, Doris, NP   5 mg at 03/15/23 2101   nicotine (NICODERM CQ - dosed in mg/24 hours) patch 21 mg  21 mg Transdermal Daily [name], DO   21 mg at 03/06/23 0819   paliperidone (INVEGA) 24 hr tablet 6 mg  6 mg Oral Daily [name], NP   6 mg at 03/15/23 2101   pantoprazole (PROTONIX) EC tablet 40 mg  40 mg Oral Daily [name], NP   40 mg at 03/16/23 1610   polyethylene glycol (MIRALAX / GLYCOLAX) packet 17 g  17 g Oral Daily PRN [name], NP   17 g at 03/12/23 1953   sertraline (ZOLOFT) tablet 150 mg  150 mg Oral Daily Massengill, Harrold Donath, MD   150 mg at 03/16/23 9604   traZODone (DESYREL) tablet 50 mg  50 mg Oral QHS [name], NP   50 mg at 03/15/23 2101   Vitamin D (Ergocalciferol) (DRISDOL) 1.25 MG (50000 UNIT) capsule 50,000 Units  50,000 Units Oral Q7 days [name], NP   50,000 Units at 03/09/23 1717   Lab Results:  No results found for this or any previous visit (from the past 48 hour(s)).  Blood Alcohol level:  Lab Results  Component Value Date   ETH <10 12/19/2022   ETH <10 11/21/2019   Metabolic Disorder Labs: Lab Results   Component Value Date   HGBA1C 4.4 (L) 12/21/2022   MPG 79.58 12/21/2022   No results found for: "PROLACTIN" Lab Results  Component Value Date   CHOL 218 (H) 12/21/2022   TRIG 81 12/21/2022   HDL 53 12/21/2022   CHOLHDL 4.1 12/21/2022   VLDL 16 12/21/2022   LDLCALC 149 (H) 12/21/2022   LDLCALC 110 (H) 03/13/2011   Physical Findings: AIMS: Facial and Oral Movements Muscles of Facial Expression: None, normal Lips and Perioral Area: None, normal Jaw: None, normal Tongue: None, normal,Extremity Movements Upper (arms, wrists, hands, fingers): None, normal Lower (legs, knees, ankles, toes): None, normal, Trunk Movements Neck, shoulders, hips: None, normal, Overall Severity Severity of abnormal movements (highest score from questions above): None, normal Incapacitation due to abnormal movements: None, normal Patient's awareness of abnormal movements (rate only patient's report): No Awareness, Dental Status Current problems with teeth and/or dentures?: No Does patient usually wear dentures?: No  CIWA:    COWS:    AIMS:0 Musculoskeletal: Strength & Muscle Tone: within normal limits Gait & Station: normal Patient leans: N/A  Psychiatric Specialty Exam:  Presentation  General Appearance:  Casual; Fairly Groomed  Eye Contact: Fair  Speech: Slow  Speech Volume: Decreased  Handedness: Right  Mood and Affect  Mood: Depressed; Hopeless  Affect: Congruent; Flat; Depressed  Thought Process  Thought Processes: Coherent  Descriptions of Associations:Intact  Orientation:Full (Time, Place and Person)  Thought Content:Logical  History of Schizophrenia/Schizoaffective disorder:Yes  Duration of Psychotic Symptoms:Greater than six months  Hallucinations:Hallucinations: None Description of Auditory Hallucinations: NA   Ideas of Reference: None  Suicidal  Thoughts:Suicidal Thoughts: No SI Active Intent and/or Plan: Without Intent; Without Plan; Without Means to  Carry Out; Without Access to Means SI Passive Intent and/or Plan: Without Intent; Without Plan; Without Means to Carry Out; Without Access to Means   Homicidal Thoughts:Homicidal Thoughts: No   Sensorium  Memory: Immediate Good; Recent Fair; Remote Fair  Judgment: -- (Limited)  Insight: Fair  Art therapist  Concentration: Good  Attention Span: Good  Recall: Fair  Fund of Knowledge: -- (Limited)  Language: Fair  Psychomotor Activity  Psychomotor Activity: Psychomotor Activity: Normal   Assets  Assets: Communication Skills; Desire for Improvement; Financial Resources/Insurance; Resilience; Social Support  Sleep  Sleep: Sleep: Good Number of Hours of Sleep: 7.5   Physical Exam: Physical Exam Vitals and nursing note reviewed.  Constitutional:      Appearance: Normal appearance.  HENT:     Nose: Nose normal.  Eyes:     Pupils: Pupils are equal, round, and reactive to light.  Pulmonary:     Effort: Pulmonary effort is normal.  Genitourinary:    Comments: Deferred Musculoskeletal:        General: Normal range of motion.     Cervical back: Normal range of motion.  Neurological:     Mental Status: She is alert and oriented to person, place, and time.    Review of Systems  Constitutional:  Negative for fever.  HENT:  Negative for hearing loss.   Eyes:  Negative for blurred vision.  Respiratory:  Negative for cough.   Cardiovascular:  Negative for chest pain.  Gastrointestinal:  Negative for heartburn.  Genitourinary:  Negative for dysuria.  Musculoskeletal:  Negative for myalgias.  Skin:  Negative for rash.  Neurological:  Negative for dizziness.  Psychiatric/Behavioral:  Positive for depression. Negative for hallucinations, memory loss, substance abuse and suicidal ideas. The patient is nervous/anxious and has insomnia.    Blood pressure (!) 93/59, pulse 60, temperature 98.6 F (37 C), temperature source Oral, resp. rate 16, height 4\' 11"   (1.499 m), weight 63 kg, SpO2 97%. Body mass index is 28.05 kg/m.  Treatment Plan Summary: Daily contact with patient to assess and evaluate symptoms and progress in treatment and Medication management.    Still waiting on safe disposition as patient would not be able to live independently given cognitive impairment. Recommend assisted living.    No medication side effects reported.  Because of urinary incontinence, recommended adult diapers to be worn.   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  Plan:  -Continue Colace 100 mg daily for constipation -Discontinued Ritalin 5 mg daily to help with energy & wakefulness. Pt refuses to take. -Continue Vitamin D 50.000 units weekly for bone health. -Continue Sertraline150 mg po Q daily for depression/anxiety -Continue Buspar 15 mg po bid for anxiety.  -Continue paliperidone 6 mg po qd for mood stabilization -Continue Trazodone 50 mg nightly for insomnia  -Continue Nicoderm 21 mg topically Q 24 hrs for nicotine withdrawal management. -Continue vitamin B12 1000 mcg IM weekly for 4 weeks then monthly -Continue Melatonin 5 mg nightly for sleep -Continue Protonix EC 40 mg p.o. daily for GERD -Continue MiraLAX 17 g p.o. PRN for constipation -Continue hydroxyzine 25 mg p.o. 3 times daily as needed for anxiety -Continue Ensure nutritional shakes TID in between meals  -Previously discontinued Hydroxyzine 50 mg nightly-hypotension in the mornings  Safety and Monitoring: Voluntary admission to inpatient psychiatric unit for safety, stabilization and treatment Daily contact with patient to assess and evaluate symptoms and progress in treatment Patient's case to be discussed in multi-disciplinary team  meeting Observation Level : q15 minute checks Vital signs: q12 hours Precautions: Safety   Discharge Planning: Social work and case management to assist with discharge planning and identification of hospital follow-up needs prior to discharge Estimated LOS: Unknown at this time. Discharge Concerns: Need to establish a safety plan; Medication compliance and effectiveness Discharge Goals: Return home with outpatient referrals for mental health follow-up including medication management/psychotherapy No legal guardian, has payee  Armandina Stammer, NP, pmhnp, fnp-bc 03/16/2023, 3:40 PM Patient ID: Yolanda [name], female   DOB: [birth year], 47 y.o.   MRN: [id number] Patient ID: JOHANN KIRKHAM, female   DOB: 1975/08/11, 47 y.o.   MRN: [id number] Patient ID: YEXALEN HELMING, female   DOB: 11/21/1975, 47 y.o.   MRN: [id number] Patient ID: BLIMA HUITT, female   DOB: 08-10-1975, 47 y.o.   MRN: [id number] Patient ID: FIERRA CAVANNA, female   DOB: September 22, 1975, 47 y.o.   MRN: [id number]

## 2023-03-17 DIAGNOSIS — F251 Schizoaffective disorder, depressive type: Secondary | ICD-10-CM | POA: Diagnosis not present

## 2023-03-17 NOTE — Group Note (Signed)
Date:  03/17/2023 Time:  10:07 PM  Group Topic/Focus:  Wrap-Up Group:   The focus of this group is to help patients review their daily goal of treatment and discuss progress on daily workbooks.    Participation Level:  Active  Participation Quality:  Appropriate, Sharing, and Supportive  Affect:  Appropriate  Cognitive:  Appropriate  Insight: Appropriate  Engagement in Group:  Engaged and Supportive  Modes of Intervention:  Activity and Socialization  Additional Comments:  The patient described her day as being a good day. The patient discussed how she didn't spend all her time in her room today and how she made some new friends on the unit. Patient engaged in the ice breaker activity after discussing her day.   Kennieth Francois 03/17/2023, 10:07 PM

## 2023-03-17 NOTE — [facility] Group Notes (Signed)
Pt did not attend orientation group.  

## 2023-03-17 NOTE — Progress Notes (Signed)
[facility] MD Progress Note  03/17/2023 9:03 AM Yolanda [name]  MRN:  [id number]  Principal Problem: Schizoaffective disorder, depressive type (HCC)  Diagnosis: Principal Problem:   Schizoaffective disorder, depressive type (HCC) Active Problems:   GERD (gastroesophageal reflux disease)   Intellectual disability   Tobacco use disorder  Reason for admission: This is the first psychiatric admission in this [facility] in 12 years for this 47 AA female with an extensive hx of mental illnesses & probable polysubstance use disorders. She is admitted to the [facility] from the [facility] hospital with complain of worsening suicidal ideations with plan to stab herself. Per chart review, patient apparently reported at the ED that she has been depressed for a while & has not been taking her mental health medications. After medical evaluation.clearance, she was transferred to the [facility] for further psychiatric evaluation/treatments.   24 hr chart review: Sleep Hours last night: 7.5 Nursing Concerns: None today. Behavioral episodes in the past 24 hrs: Stable.  Medication Compliance: Compliant  Vital Signs in the past 24 hrs: WNL PRN Medications in the past 24 hrs: None                                             Patient assessment note: Yolanda [name] is seen in her room, chart reviewed. The chart findings discussed with the treatment team. She was lying down in bed during this evaluation. She presents with a much improved affect this morning. She is making a fair eye contact & verbally responsive. Yolanda [name] continues to speaks in a slow monotonic voice, sometimes has to repeat herself because her speech at times is garbled or stuttered. She reports, "I'm okay, just resting. I have been up for breakfast. I only ate potato patty this morning for breakfast. I slept well last night. I'm fixing to get out of bed in few minutes". Yolanda [name] currently denies any SIHI, AVH, delusional thoughts or paranoia. She does not appear to be responding  to any internal stimuli. Patient remains without any changes. Currently waiting anxiously/hopefully to hear that a place of residence has been found for her by the DSS. There are no changes made on her current plan of care. Will continue as already in progress. Vital signs remain stable. Discussed this case with the attending psychiatrists. Patient is in no apparent distress.  Total Time spent with patient:  35 minutes  Past Psychiatric History: See H & P  Past Medical History:  Past Medical History:  Diagnosis Date   Bipolar affect, depressed (HCC)    Constipation 08/17/2022   Depression    Falls 07/21/2022   Fracture of femoral neck, right, closed (HCC) 01/17/2022   Herpes simplex 08/22/2017   Open fracture dislocation of right elbow joint 01/17/2022    Past Surgical History:  Procedure Laterality Date   NO PAST SURGERIES     SALPINGECTOMY     Family History: History reviewed. No pertinent family history.  Family Psychiatric  History: See H & P  Social History:  Social History   Substance and Sexual Activity  Alcohol Use Yes     Social History   Substance and Sexual Activity  Drug Use Yes   Types: Cocaine, Marijuana    Social History   Socioeconomic History   Marital status: Single    Spouse name: Not on file   Number of children: Not on file  Years of education: Not on file   Highest education level: Not on file  Occupational History   Not on file  Tobacco Use   Smoking status: Every Day   Smokeless tobacco: Not on file  Substance and Sexual Activity   Alcohol use: Yes   Drug use: Yes    Types: Cocaine, Marijuana   Sexual activity: Yes  Other Topics Concern   Not on file  Social History Narrative   Not on file   Social Determinants of Health   Financial Resource Strain: Low Risk  (09/12/2022)   Received from [facility], [facility]   Overall Financial Resource Strain (CARDIA)    Difficulty of Paying Living Expenses: Not hard at all  Food  Insecurity: Patient Declined (12/20/2022)   Hunger Vital Sign    Worried About Running Out of Food in the Last Year: Patient declined    Ran Out of Food in the Last Year: Patient declined  Transportation Needs: No Transportation Needs (12/20/2022)   PRAPARE - Administrator, Civil Service (Medical): No    Lack of Transportation (Non-Medical): No  Physical Activity: Not on file  Stress: No Stress Concern Present (07/17/2022)   Received from [facility], [facility] - Occupational Stress Questionnaire    Feeling of Stress : Not at all  Social Connections: Unknown (07/16/2022)   Received from [name], [facility]   Social Network    Social Network: Not on file   Sleep: Good  Appetite:  Good  Current Medications: Current Facility-Administered Medications  Medication Dose Route Frequency Provider Last Rate Last Admin   acetaminophen (TYLENOL) tablet 650 mg  650 mg Oral Q6H PRN [name], NP   650 mg at 03/11/23 2145   alum & mag hydroxide-simeth (MAALOX/MYLANTA) 200-200-20 MG/5ML suspension 30 mL  30 mL Oral Q4H PRN [name], NP   30 mL at 01/28/23 1530   busPIRone (BUSPAR) tablet 15 mg  15 mg Oral BID [name], NP   15 mg at 03/17/23 4098   cyanocobalamin (VITAMIN B12) injection 1,000 mcg  1,000 mcg Intramuscular Q30 days [name], Nadir, MD   1,000 mcg at 03/04/23 1421   diphenhydrAMINE (BENADRYL) capsule 50 mg  50 mg Oral TID PRN [name], NP   50 mg at 01/15/23 1211   Or   diphenhydrAMINE (BENADRYL) injection 50 mg  50 mg Intramuscular TID PRN [name], NP       feeding supplement (ENSURE ENLIVE / ENSURE PLUS) liquid 237 mL  237 mL Oral TID BM Nkwenti, Doris, NP   237 mL at 03/16/23 2101   haloperidol (HALDOL) tablet 5 mg  5 mg Oral TID PRN [name], NP   5 mg at 01/15/23 1211   Or   haloperidol lactate (HALDOL) injection 5 mg  5 mg Intramuscular TID PRN [name], NP       hydrOXYzine  (ATARAX) tablet 25 mg  25 mg Oral TID PRN [name], DO   25 mg at 03/15/23 2100   LORazepam (ATIVAN) tablet 2 mg  2 mg Oral TID PRN [name], NP   2 mg at 01/15/23 1211   Or   LORazepam (ATIVAN) injection 2 mg  2 mg Intramuscular TID PRN [name], NP       magnesium hydroxide (MILK OF MAGNESIA) suspension 30 mL  30 mL Oral Daily PRN [name], NP   30 mL at 02/26/23 1828  melatonin tablet 5 mg  5 mg Oral QHS Nkwenti, Doris, NP   5 mg at 03/16/23 2100   nicotine (NICODERM CQ - dosed in mg/24 hours) patch 21 mg  21 mg Transdermal Daily [name], DO   21 mg at 03/06/23 0819   paliperidone (INVEGA) 24 hr tablet 6 mg  6 mg Oral Daily [name], NP   6 mg at 03/16/23 2100   pantoprazole (PROTONIX) EC tablet 40 mg  40 mg Oral Daily [name], NP   40 mg at 03/17/23 0842   polyethylene glycol (MIRALAX / GLYCOLAX) packet 17 g  17 g Oral Daily PRN [name], NP   17 g at 03/12/23 1953   sertraline (ZOLOFT) tablet 150 mg  150 mg Oral Daily Massengill, Harrold Donath, MD   150 mg at 03/17/23 1610   traZODone (DESYREL) tablet 50 mg  50 mg Oral QHS [name], NP   50 mg at 03/16/23 2100   Vitamin D (Ergocalciferol) (DRISDOL) 1.25 MG (50000 UNIT) capsule 50,000 Units  50,000 Units Oral Q7 days [name], NP   50,000 Units at 03/16/23 1621   Lab Results:  No results found for this or any previous visit (from the past 48 hour(s)).  Blood Alcohol level:  Lab Results  Component Value Date   ETH <10 12/19/2022   ETH <10 11/21/2019   Metabolic Disorder Labs: Lab Results  Component Value Date   HGBA1C 4.4 (L) 12/21/2022   MPG 79.58 12/21/2022   No results found for: "PROLACTIN" Lab Results  Component Value Date   CHOL 218 (H) 12/21/2022   TRIG 81 12/21/2022   HDL 53 12/21/2022   CHOLHDL 4.1 12/21/2022   VLDL 16 12/21/2022   LDLCALC 149 (H) 12/21/2022   LDLCALC 110 (H) 03/13/2011   Physical Findings: AIMS: Facial and Oral Movements Muscles of Facial  Expression: None, normal Lips and Perioral Area: None, normal Jaw: None, normal Tongue: None, normal,Extremity Movements Upper (arms, wrists, hands, fingers): None, normal Lower (legs, knees, ankles, toes): None, normal, Trunk Movements Neck, shoulders, hips: None, normal, Overall Severity Severity of abnormal movements (highest score from questions above): None, normal Incapacitation due to abnormal movements: None, normal Patient's awareness of abnormal movements (rate only patient's report): No Awareness, Dental Status Current problems with teeth and/or dentures?: No Does patient usually wear dentures?: No  CIWA:    COWS:    AIMS:0 Musculoskeletal: Strength & Muscle Tone: within normal limits Gait & Station: normal Patient leans: N/A  Psychiatric Specialty Exam:  Presentation  General Appearance:  Casual; Fairly Groomed  Eye Contact: Fair  Speech: Slow  Speech Volume: Decreased  Handedness: Right  Mood and Affect  Mood: Depressed; Hopeless  Affect: Congruent; Flat; Depressed  Thought Process  Thought Processes: Coherent  Descriptions of Associations:Intact  Orientation:Full (Time, Place and Person)  Thought Content:Logical  History of Schizophrenia/Schizoaffective disorder:Yes  Duration of Psychotic Symptoms:Greater than six months  Hallucinations:Hallucinations: None Description of Auditory Hallucinations: NA   Ideas of Reference: None  Suicidal Thoughts:Suicidal Thoughts: No SI Active Intent and/or Plan: Without Intent; Without Plan; Without Means to Carry Out; Without Access to Means SI Passive Intent and/or Plan: Without Intent; Without Plan; Without Means to Carry Out; Without Access to Means   Homicidal Thoughts:Homicidal Thoughts: No   Sensorium  Memory: Immediate Good; Recent Fair; Remote Fair  Judgment: -- (Limited)  Insight: Fair  Executive Functions  Concentration: Good  Attention Span: Good  Recall: [employer]  of Knowledge: -- (Limited)  Language: Fair  Psychomotor Activity  Psychomotor Activity: Psychomotor Activity: Normal   Assets  Assets: Communication Skills; Desire for Improvement; Financial Resources/Insurance; Resilience; Social Support  Sleep  Sleep: Sleep: Good Number of Hours of Sleep: 7.5   Physical Exam: Physical Exam Vitals and nursing note reviewed.  Constitutional:      Appearance: Normal appearance.  HENT:     Nose: Nose normal.  Eyes:     Pupils: Pupils are equal, round, and reactive to light.  Pulmonary:     Effort: Pulmonary effort is normal.  Genitourinary:    Comments: Deferred Musculoskeletal:        General: Normal range of motion.     Cervical back: Normal range of motion.  Neurological:     Mental Status: She is alert and oriented to person, place, and time.    Review of Systems  Constitutional:  Negative for fever.  HENT:  Negative for hearing loss.   Eyes:  Negative for blurred vision.  Respiratory:  Negative for cough.   Cardiovascular:  Negative for chest pain.  Gastrointestinal:  Negative for heartburn.  Genitourinary:  Negative for dysuria.  Musculoskeletal:  Negative for myalgias.  Skin:  Negative for rash.  Neurological:  Negative for dizziness.  Psychiatric/Behavioral:  Positive for depression. Negative for hallucinations, memory loss, substance abuse and suicidal ideas. The patient is nervous/anxious and has insomnia.    Blood pressure 115/89, pulse 100, temperature 98.6 F (37 C), temperature source Oral, resp. rate 16, height 4\' 11"  (1.499 m), weight 63 kg, SpO2 99%. Body mass index is 28.05 kg/m.  Treatment Plan Summary: Daily contact with patient to assess and evaluate symptoms and progress in treatment and Medication management.    Still waiting on safe disposition as patient would not be able to live independently given cognitive impairment. Recommend assisted living.    No medication side effects reported.  Because  of urinary incontinence, recommended adult diapers to be worn.   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  Plan:  -Continue Colace 100 mg daily for constipation -Discontinued Ritalin 5 mg daily to help with energy & wakefulness. Pt refuses to take. -Continue Vitamin D 50.000 units weekly for bone health. -Continue Sertraline150 mg po Q daily for depression/anxiety -Continue Buspar 15 mg po bid for anxiety.  -Continue paliperidone 6 mg po qd for mood stabilization -Continue Trazodone 50 mg nightly for insomnia  -Continue Nicoderm 21 mg topically Q 24 hrs for nicotine withdrawal management. -Continue vitamin B12 1000 mcg IM weekly for 4 weeks then monthly -Continue Melatonin 5 mg nightly for sleep -Continue Protonix EC 40 mg p.o. daily for GERD -Continue MiraLAX 17 g p.o. PRN for constipation -Continue hydroxyzine 25 mg p.o. 3 times daily as needed for anxiety -Continue Ensure nutritional shakes TID in between meals  -Previously discontinued Hydroxyzine 50 mg nightly-hypotension in the mornings   Safety and Monitoring: Voluntary admission to inpatient psychiatric unit for safety, stabilization and treatment Daily contact with patient to assess and evaluate symptoms and progress in treatment Patient's case to be discussed in multi-disciplinary team meeting Observation Level : q15 minute checks Vital signs: q12 hours Precautions: Safety   Discharge Planning: Social work and case management to assist with discharge planning and identification of hospital follow-up needs prior to discharge Estimated LOS: Unknown at this time. Discharge Concerns: Need to establish a safety plan;  Medication compliance and effectiveness Discharge Goals: Return home with  outpatient referrals for mental health follow-up including medication management/psychotherapy No legal guardian, has payee  Armandina Stammer, NP, pmhnp, fnp-bc 03/17/2023, 9:03 AM Patient ID: Larey Brick, female   DOB: 27-Sep-1975, 47 y.o.   MRN: [id number] Patient ID: SHERRILEE STEIGERWALD, female   DOB: 10-28-1975, 47 y.o.   MRN: [id number] Patient ID: HARMONEY MCLELLAN, female   DOB: [birth year], 47 y.o.   MRN: [id number] Patient ID: BRIANA GARRELTS, female   DOB: [birth year], 47 y.o.   MRN: [id number] Patient ID: MYESHIA [name], female   DOB: Dec 27, 1975, 47 y.o.   MRN: [id number] Patient ID: KATELINN FARAGHER, female   DOB: 1975/12/06, 47 y.o.   MRN: [id number]

## 2023-03-17 NOTE — Progress Notes (Signed)
   03/17/23 0527  15 Minute Checks  Location Bedroom  Visual Appearance Calm  Behavior Sleeping  Sleep (Behavioral Health Patients Only)  Calculate sleep? (Click Yes once per 24 hr at 0600 safety check) Yes  Documented sleep last 24 hours 13.75

## 2023-03-17 NOTE — Plan of Care (Signed)
  Problem: Activity: Goal: Interest or engagement in leisure activities will improve Outcome: Progressing   Problem: Coping: Goal: Coping ability will improve Outcome: Progressing   

## 2023-03-17 NOTE — Group Note (Signed)
LCSW Group Therapy Note  Group Date: 03/17/2023 Start Time: 1000 End Time: 1100   Type of Therapy and Topic:  Group Therapy: Anger Cues and Responses  Participation Level:  Did Not Attend   Description of Group:   In this group, patients learned how to recognize the physical, cognitive, emotional, and behavioral responses they have to anger-provoking situations.  They identified a recent time they became angry and how they reacted.  They analyzed how their reaction was possibly beneficial and how it was possibly unhelpful.  The group discussed a variety of healthier coping skills that could help with such a situation in the future.  Focus was placed on how helpful it is to recognize the underlying emotions to our anger, because working on those can lead to a more permanent solution as well as our ability to focus on the important rather than the urgent.  Therapeutic Goals: Patients will remember their last incident of anger and how they felt emotionally and physically, what their thoughts were at the time, and how they behaved. Patients will identify how their behavior at that time worked for them, as well as how it worked against them. Patients will explore possible new behaviors to use in future anger situations. Patients will learn that anger itself is normal and cannot be eliminated, and that healthier reactions can assist with resolving conflict rather than worsening situations.    Therapeutic Modalities:   Cognitive Behavioral Therapy    Marinda Elk, Theresia Majors 03/17/2023  10:54 AM

## 2023-03-17 NOTE — Progress Notes (Signed)
   03/17/23 2149  Psych Admission Type (Psych Patients Only)  Admission Status Involuntary  Psychosocial Assessment  Patient Complaints Anxiety;Worrying  Eye Contact Fair  Facial Expression Animated  Affect Appropriate to circumstance  Interaction Childlike  Appearance/Hygiene Improved  Behavior Characteristics Appropriate to situation  Mood Depressed;Anxious;Pleasant  Thought Process  Coherency WDL  Content WDL  Delusions None reported or observed  Perception WDL  Hallucination None reported or observed  Judgment Poor  Confusion None  Danger to Self  Current suicidal ideation? Denies  Self-Injurious Behavior No self-injurious ideation or behavior indicators observed or expressed   Agreement Not to Harm Self Yes  Description of Agreement verbal  Danger to Others  Danger to Others None reported or observed

## 2023-03-17 NOTE — Plan of Care (Signed)
Nurse discussed anxiety, depression and coping skills with patient.  

## 2023-03-17 NOTE — Progress Notes (Addendum)
D:  Patient denied SI and HI, contracts for safety.  Denied A/V hallucinations.  Denied pain. A:  Medications administered per MD orders.  Emotional support and encouragement given patient. R:  Safety maintained with 15 minute checks.  Patient's self inventory sheet, patient sleeps good, no sleep medication.  Good appetite, normal energy level, good concentration.  Rated depression, hopeless and anxiety 3.  Denied withdrawals.  Denied SI.  Denied physical problems.  Denied physical pain.  No discharge plans.

## 2023-03-18 ENCOUNTER — Encounter (HOSPITAL_COMMUNITY): Payer: Self-pay

## 2023-03-18 DIAGNOSIS — F251 Schizoaffective disorder, depressive type: Secondary | ICD-10-CM | POA: Diagnosis not present

## 2023-03-18 MED ORDER — WHITE PETROLATUM EX OINT
TOPICAL_OINTMENT | CUTANEOUS | Status: AC
  Filled 2023-03-18: qty 5

## 2023-03-18 NOTE — Plan of Care (Signed)
  Problem: Activity: Goal: Interest or engagement in leisure activities will improve Outcome: Progressing Goal: Imbalance in normal sleep/wake cycle will improve Outcome: Progressing   

## 2023-03-18 NOTE — Group Note (Signed)
Date:  03/18/2023 Time:  10:01 AM  Group Topic/Focus:  Goals Group:   The focus of this group is to help patients establish daily goals to achieve during treatment and discuss how the patient can incorporate goal setting into their daily lives to aide in recovery. Orientation:   The focus of this group is to educate the patient on the purpose and policies of crisis stabilization and provide a format to answer questions about their admission.  The group details unit policies and expectations of patients while admitted.    Participation Level:  Did Not Attend  Additional Comments:  Patient was encouraged to attend group multiple times.  Yolanda [name] 03/18/2023, 10:01 AM

## 2023-03-18 NOTE — [facility] Group Notes (Signed)
[facility] Group Notes:  (Nursing/MHT/Case Management/Adjunct)  Date:  03/18/2023  Time:  9:24 PM  Type of Therapy:   AA group  Participation Level:  Did Not Attend  Participation Quality:    Affect:    Cognitive:    Insight:    Engagement in Group:    Modes of Intervention:    Summary of Progress/Problems:  Noah Delaine 03/18/2023, 9:24 PM

## 2023-03-18 NOTE — Progress Notes (Signed)
   03/18/23 0900  Psych Admission Type (Psych Patients Only)  Admission Status Involuntary  Psychosocial Assessment  Patient Complaints Anxiety  Eye Contact Fair  Facial Expression Animated  Affect Appropriate to circumstance  Speech Soft  Interaction Childlike  Motor Activity Slow  Appearance/Hygiene Disheveled  Behavior Characteristics Appropriate to situation  Mood Depressed;Anxious  Thought Process  Coherency WDL  Content WDL  Delusions None reported or observed  Perception WDL  Hallucination None reported or observed  Judgment Poor  Confusion None  Danger to Self  Current suicidal ideation? Denies  Description of Suicide Plan No plan  Self-Injurious Behavior No self-injurious ideation or behavior indicators observed or expressed   Agreement Not to Harm Self Yes  Description of Agreement Verbal  Danger to Others  Danger to Others None reported or observed

## 2023-03-18 NOTE — BH IP Treatment Plan (Signed)
Interdisciplinary Treatment and Diagnostic Plan Update  03/18/2023 Time of Session: 9:30 AM ( update)  Yolanda [name] MRN: [id number]  Principal Diagnosis: Schizoaffective disorder, depressive type (HCC)  Secondary Diagnoses: Principal Problem:   Schizoaffective disorder, depressive type (HCC) Active Problems:   GERD (gastroesophageal reflux disease)   Intellectual disability   Tobacco use disorder   Current Medications:  Current Facility-Administered Medications  Medication Dose Route Frequency Provider Last Rate Last Admin   acetaminophen (TYLENOL) tablet 650 mg  650 mg Oral Q6H PRN [name], NP   650 mg at 03/11/23 2145   alum & mag hydroxide-simeth (MAALOX/MYLANTA) 200-200-20 MG/5ML suspension 30 mL  30 mL Oral Q4H PRN [name], NP   30 mL at 01/28/23 1530   busPIRone (BUSPAR) tablet 15 mg  15 mg Oral BID [name], NP   15 mg at 03/18/23 0803   cyanocobalamin (VITAMIN B12) injection 1,000 mcg  1,000 mcg Intramuscular Q30 days [name], Nadir, MD   1,000 mcg at 03/04/23 1421   diphenhydrAMINE (BENADRYL) capsule 50 mg  50 mg Oral TID PRN [name], NP   50 mg at 01/15/23 1211   Or   diphenhydrAMINE (BENADRYL) injection 50 mg  50 mg Intramuscular TID PRN [name], NP       feeding supplement (ENSURE ENLIVE / ENSURE PLUS) liquid 237 mL  237 mL Oral TID BM Nkwenti, Doris, NP   237 mL at 03/18/23 1357   haloperidol (HALDOL) tablet 5 mg  5 mg Oral TID PRN [name], NP   5 mg at 01/15/23 1211   Or   haloperidol lactate (HALDOL) injection 5 mg  5 mg Intramuscular TID PRN [name], NP       hydrOXYzine (ATARAX) tablet 25 mg  25 mg Oral TID PRN [name], DO   25 mg at 03/17/23 2106   LORazepam (ATIVAN) tablet 2 mg  2 mg Oral TID PRN [name], NP   2 mg at 01/15/23 1211   Or   LORazepam (ATIVAN) injection 2 mg  2 mg Intramuscular TID PRN [name], NP       magnesium hydroxide (MILK OF MAGNESIA) suspension 30 mL  30 mL Oral Daily PRN  [name], NP   30 mL at 02/26/23 1828   melatonin tablet 5 mg  5 mg Oral QHS Nkwenti, Doris, NP   5 mg at 03/17/23 2106   nicotine (NICODERM CQ - dosed in mg/24 hours) patch 21 mg  21 mg Transdermal Daily [name], DO   21 mg at 03/06/23 0819   paliperidone (INVEGA) 24 hr tablet 6 mg  6 mg Oral Daily [name], NP   6 mg at 03/17/23 2106   pantoprazole (PROTONIX) EC tablet 40 mg  40 mg Oral Daily [name], NP   40 mg at 03/18/23 0803   polyethylene glycol (MIRALAX / GLYCOLAX) packet 17 g  17 g Oral Daily PRN [name], NP   17 g at 03/12/23 1953   sertraline (ZOLOFT) tablet 150 mg  150 mg Oral Daily Massengill, Harrold Donath, MD   150 mg at 03/18/23 0803   traZODone (DESYREL) tablet 50 mg  50 mg Oral QHS [name], NP   50 mg at 03/17/23 2106   Vitamin D (Ergocalciferol) (DRISDOL) 1.25 MG (50000 UNIT) capsule 50,000 Units  50,000 Units Oral Q7 days [name], NP   50,000 Units at 03/16/23 1621   PTA Medications: Medications Prior to Admission  Medication Sig  Dispense Refill Last Dose   busPIRone (BUSPAR) 15 MG tablet Take 15 mg by mouth 2 (two) times daily. (Patient not taking: Reported on 12/19/2022)      paliperidone (INVEGA SUSTENNA) 156 MG/ML SUSY injection Inject 156 mg into the muscle once. (Patient not taking: Reported on 12/19/2022)      sertraline (ZOLOFT) 50 MG tablet Take 150 mg by mouth daily. (Patient not taking: Reported on 12/19/2022)      traZODone (DESYREL) 100 MG tablet Take 100 mg by mouth at bedtime as needed for sleep. (Patient not taking: Reported on 11/12/2022)       Patient Stressors: Medication change or noncompliance    Patient Strengths: Forensic psychologist fund of knowledge   Treatment Modalities: Medication Management, Group therapy, Case management,  1 to 1 session with clinician, Psychoeducation, Recreational therapy.   Physician Treatment Plan for Primary Diagnosis: Schizoaffective disorder, depressive type (HCC) Long Term  Goal(s): Improvement in symptoms so as ready for discharge   Short Term Goals: Ability to identify and develop effective coping behaviors will improve Ability to maintain clinical measurements within normal limits will improve Compliance with prescribed medications will improve Ability to identify triggers associated with substance abuse/mental health issues will improve Ability to identify changes in lifestyle to reduce recurrence of condition will improve Ability to verbalize feelings will improve Ability to disclose and discuss suicidal ideas Ability to demonstrate self-control will improve  Medication Management: Evaluate patient's response, side effects, and tolerance of medication regimen.  Therapeutic Interventions: 1 to 1 sessions, Unit Group sessions and Medication administration.  Evaluation of Outcomes: Progressing  Physician Treatment Plan for Secondary Diagnosis: Principal Problem:   Schizoaffective disorder, depressive type (HCC) Active Problems:   GERD (gastroesophageal reflux disease)   Intellectual disability   Tobacco use disorder  Long Term Goal(s): Improvement in symptoms so as ready for discharge   Short Term Goals: Ability to identify and develop effective coping behaviors will improve Ability to maintain clinical measurements within normal limits will improve Compliance with prescribed medications will improve Ability to identify triggers associated with substance abuse/mental health issues will improve Ability to identify changes in lifestyle to reduce recurrence of condition will improve Ability to verbalize feelings will improve Ability to disclose and discuss suicidal ideas Ability to demonstrate self-control will improve     Medication Management: Evaluate patient's response, side effects, and tolerance of medication regimen.  Therapeutic Interventions: 1 to 1 sessions, Unit Group sessions and Medication administration.  Evaluation of Outcomes:  Progressing   RN Treatment Plan for Primary Diagnosis: Schizoaffective disorder, depressive type (HCC) Long Term Goal(s): Knowledge of disease and therapeutic regimen to maintain health will improve  Short Term Goals: Ability to remain free from injury will improve, Ability to verbalize frustration and anger appropriately will improve, Ability to participate in decision making will improve, Ability to verbalize feelings will improve, Ability to identify and develop effective coping behaviors will improve, and Compliance with prescribed medications will improve  Medication Management: RN will administer medications as ordered by provider, will assess and evaluate patient's response and provide education to patient for prescribed medication. RN will report any adverse and/or side effects to prescribing provider.  Therapeutic Interventions: 1 on 1 counseling sessions, Psychoeducation, Medication administration, Evaluate responses to treatment, Monitor vital signs and CBGs as ordered, Perform/monitor CIWA, COWS, AIMS and Fall Risk screenings as ordered, Perform wound care treatments as ordered.  Evaluation of Outcomes: Progressing   LCSW Treatment Plan for Primary Diagnosis: Schizoaffective disorder, depressive type (  HCC) Long Term Goal(s): Safe transition to appropriate next level of care at discharge, Engage patient in therapeutic group addressing interpersonal concerns.  Short Term Goals: Engage patient in aftercare planning with referrals and resources, Increase social support, Increase emotional regulation, Facilitate acceptance of mental health diagnosis and concerns, Identify triggers associated with mental health/substance abuse issues, and Increase skills for wellness and recovery  Therapeutic Interventions: Assess for all discharge needs, 1 to 1 time with Social worker, Explore available resources and support systems, Assess for adequacy in community support network, Educate family and  significant other(s) on suicide prevention, Complete Psychosocial Assessment, Interpersonal group therapy.  Evaluation of Outcomes: Progressing   Progress in Treatment: Attending groups: Yes. Participating in groups: Yes. Taking medication as prescribed: Yes. Toleration medication: Yes. Family/Significant other contact made: Yes, individual(s) contacted:  DSS Patient understands diagnosis: Yes. Discussing patient identified problems/goals with staff: Yes. Medical problems stabilized or resolved: Yes. Denies suicidal/homicidal ideation: Yes. Issues/concerns per patient self-inventory: Yes. Other: N /A   New problem(s) identified: No, Describe:  none reported   New Short Term/Long Term Goal(s): Guardianship/DSS   Patient Goals:  Group Home placement   Discharge Plan or Barriers: CSW will continue to follow and assess for appropriate referrals and possible discharge planning.    Reason for Continuation of Hospitalization: Anxiety Depression Medication stabilization Suicidal ideation   Estimated Length of Stay: Unknown still looking for placement   Last 3 [place] Suicide Severity Risk Score: Flowsheet Row Ad[facility] (Current) [facility] from [facility]19/[facility]22 in [facility] at [facility] ED from 11/12/2022 in [facility] Emergency Department at [facility]  C-SSRS RISK CATEGORY High Risk High Risk Moderate Risk       Last [facility] 2/9 Scores:     No data to display          Scribe for Treatment Team: Beather Arbour 03/18/2023 3:54 PM

## 2023-03-18 NOTE — Progress Notes (Signed)
[facility] MD Progress Note  03/18/2023 9:31 AM Chelsae KRYSTALYN SWOR  MRN:  [id number]  Principal Problem: Schizoaffective disorder, depressive type (HCC)  Diagnosis: Principal Problem:   Schizoaffective disorder, depressive type (HCC) Active Problems:   GERD (gastroesophageal reflux disease)   Intellectual disability   Tobacco use disorder  Reason for admission: This is the first psychiatric admission in this [facility] in 12 years for this 43 AA female with an extensive hx of mental illnesses & probable polysubstance use disorders. She is admitted to the [facility] from the [facility] hospital with complain of worsening suicidal ideations with plan to stab herself. Per chart review, patient apparently reported at the ED that she has been depressed for a while & has not been taking her mental health medications. After medical evaluation.clearance, she was transferred to the [facility] for further psychiatric evaluation/treatments.   24 hr chart review: Sleep Hours last night: 7.5 Nursing Concerns: None noted. Behavioral episodes in the past 24 hrs: none reported.  Medication Compliance: Compliant  Vital Signs in the past 24 hrs: WNL PRN Medications in the past 24 hrs: Atarax prn for anxiety                                           Patient assessment note:  Patient was evaluated in her room she is lying down in bed but answering questions in a linear manner, she reports good sleep and reports also attending some groups and staying out of her room, reports fair appetite and energy level, continues to report depressed mood mainly secondary to being in the hospital but denies passive or active SI intention or plan denies feeling hopeless or helpless except for what is related to placement, denies HI or AVH, does not appear responding to stimuli.  Denies side effect to medications, does not appear sleepy, does not display any sign consistent with EPS or TD.  Total Time spent with patient:  35 minutes  Past Psychiatric  History:  See H & P  Past Medical History:  Past Medical History:  Diagnosis Date   Bipolar affect, depressed (HCC)    Constipation 08/17/2022   Depression    Falls 07/21/2022   Fracture of femoral neck, right, closed (HCC) 01/17/2022   Herpes simplex 08/22/2017   Open fracture dislocation of right elbow joint 01/17/2022    Past Surgical History:  Procedure Laterality Date   NO PAST SURGERIES     SALPINGECTOMY     Family History: History reviewed. No pertinent family history.  Family Psychiatric  History: See H & P  Social History:  Social History   Substance and Sexual Activity  Alcohol Use Yes     Social History   Substance and Sexual Activity  Drug Use Yes   Types: Cocaine, Marijuana    Social History   Socioeconomic History   Marital status: Single    Spouse name: Not on file   Number of children: Not on file   Years of education: Not on file   Highest education level: Not on file  Occupational History   Not on file  Tobacco Use   Smoking status: Every Day   Smokeless tobacco: Not on file  Substance and Sexual Activity   Alcohol use: Yes   Drug use: Yes    Types: Cocaine, Marijuana   Sexual activity: Yes  Other Topics Concern   Not on file  Social History Narrative   Not on file   Social Determinants of Health   Financial Resource Strain: Low Risk  (09/12/2022)   Received from [facility]   Overall Financial Resource Strain (CARDIA)    Difficulty of Paying Living Expenses: Not hard at all  Food Insecurity: Patient Declined (12/20/2022)   Hunger Vital Sign    Worried About Running Out of Food in the Last Year: Patient declined    Ran Out of Food in the Last Year: Patient declined  Transportation Needs: No Transportation Needs (12/20/2022)   PRAPARE - Administrator, Civil Service (Medical): No    Lack of Transportation (Non-Medical): No  Physical Activity: Not on file  Stress: No Stress Concern Present (07/17/2022)    Received from [facility] - Occupational Stress Questionnaire    Feeling of Stress : Not at all  Social Connections: Unknown (07/16/2022)   Received from [facility]   Social Network    Social Network: Not on file   Sleep: Good  Appetite:  Good  Current Medications: Current Facility-Administered Medications  Medication Dose Route Frequency Provider Last Rate Last Admin   acetaminophen (TYLENOL) tablet 650 mg  650 mg Oral Q6H PRN [name], NP   650 mg at 03/11/23 2145   alum & mag hydroxide-simeth (MAALOX/MYLANTA) 200-200-20 MG/5ML suspension 30 mL  30 mL Oral Q4H PRN [name], NP   30 mL at 01/28/23 1530   busPIRone (BUSPAR) tablet 15 mg  15 mg Oral BID [name], NP   15 mg at 03/18/23 0803   cyanocobalamin (VITAMIN B12) injection 1,000 mcg  1,000 mcg Intramuscular Q30 days [name], Ashleah Valtierra, MD   1,000 mcg at 03/04/23 1421   diphenhydrAMINE (BENADRYL) capsule 50 mg  50 mg Oral TID PRN [name], NP   50 mg at 01/15/23 1211   Or   diphenhydrAMINE (BENADRYL) injection 50 mg  50 mg Intramuscular TID PRN [name], NP       feeding supplement (ENSURE ENLIVE / ENSURE PLUS) liquid 237 mL  237 mL Oral TID BM Nkwenti, Doris, NP   237 mL at 03/17/23 2106   haloperidol (HALDOL) tablet 5 mg  5 mg Oral TID PRN [name], NP   5 mg at 01/15/23 1211   Or   haloperidol lactate (HALDOL) injection 5 mg  5 mg Intramuscular TID PRN [name], NP       hydrOXYzine (ATARAX) tablet 25 mg  25 mg Oral TID PRN [name], DO   25 mg at 03/17/23 2106   LORazepam (ATIVAN) tablet 2 mg  2 mg Oral TID PRN [name], NP   2 mg at 01/15/23 1211   Or   LORazepam (ATIVAN) injection 2 mg  2 mg Intramuscular TID PRN [name], NP       magnesium hydroxide (MILK OF MAGNESIA) suspension 30 mL  30 mL Oral Daily PRN [name], NP   30 mL at 02/26/23 1828   melatonin tablet 5 mg  5 mg Oral QHS Nkwenti,  Doris, NP   5 mg at 03/17/23 2106   nicotine (NICODERM CQ - dosed in mg/24 hours) patch 21 mg  21 mg Transdermal Daily [name], DO   21 mg at 03/06/23 0819   paliperidone (INVEGA) 24 hr tablet 6 mg  6 mg Oral Daily [name], NP   6 mg at 03/17/23 2106  pantoprazole (PROTONIX) EC tablet 40 mg  40 mg Oral Daily Nwoko, Nicole Kindred I, NP   40 mg at 03/18/23 0803   polyethylene glycol (MIRALAX / GLYCOLAX) packet 17 g  17 g Oral Daily PRN [name], NP   17 g at 03/12/23 1953   sertraline (ZOLOFT) tablet 150 mg  150 mg Oral Daily Massengill, Harrold Donath, MD   150 mg at 03/18/23 0803   traZODone (DESYREL) tablet 50 mg  50 mg Oral QHS [name], NP   50 mg at 03/17/23 2106   Vitamin D (Ergocalciferol) (DRISDOL) 1.25 MG (50000 UNIT) capsule 50,000 Units  50,000 Units Oral Q7 days [name], NP   50,000 Units at 03/16/23 1621   Lab Results:  No results found for this or any previous visit (from the past 48 hour(s)).  Blood Alcohol level:  Lab Results  Component Value Date   ETH <10 12/19/2022   ETH <10 11/21/2019   Metabolic Disorder Labs: Lab Results  Component Value Date   HGBA1C 4.4 (L) 12/21/2022   MPG 79.58 12/21/2022   No results found for: "PROLACTIN" Lab Results  Component Value Date   CHOL 218 (H) 12/21/2022   TRIG 81 12/21/2022   HDL 53 12/21/2022   CHOLHDL 4.1 12/21/2022   VLDL 16 12/21/2022   LDLCALC 149 (H) 12/21/2022   LDLCALC 110 (H) 03/13/2011   Physical Findings: AIMS: Facial and Oral Movements Muscles of Facial Expression: None, normal Lips and Perioral Area: None, normal Jaw: None, normal Tongue: None, normal,Extremity Movements Upper (arms, wrists, hands, fingers): None, normal Lower (legs, knees, ankles, toes): None, normal, Trunk Movements Neck, shoulders, hips: None, normal, Overall Severity Severity of abnormal movements (highest score from questions above): None, normal Incapacitation due to abnormal movements: None, normal Patient's  awareness of abnormal movements (rate only patient's report): No Awareness, Dental Status Current problems with teeth and/or dentures?: No Does patient usually wear dentures?: No  CIWA:    COWS:    AIMS:0 Musculoskeletal: Strength & Muscle Tone: within normal limits Gait & Station: normal Patient leans: N/A  Psychiatric Specialty Exam:  Presentation  General Appearance:  Casual; Fairly Groomed  Eye Contact: Limited to baseline Speech: Decreased amount, decreased tone and volume but at baseline Speech Volume: Decreased  Handedness: Right  Mood and Affect  Mood: Sad and depressed mainly secondary to being in the hospital with no place to go Affect: Congruent; Flat; Depressed  Thought Process  Thought Processes: Coherent  Descriptions of Associations:Intact  Orientation:Full (Time, Place and Person)  Thought Content:Logical Concrete History of Schizophrenia/Schizoaffective disorder:Yes  Duration of Psychotic Symptoms:Greater than six months  Hallucinations:No data recorded Denies AVH, does not appear responding to stimuli  Ideas of Reference: None  Suicidal Thoughts:No data recorded Denies passive or active SI intention or plan   Homicidal Thoughts:No data recorded Denies HI  Sensorium  Memory: Immediate Good; Recent Fair; Remote Fair  Judgment: -- (Limited)  Insight: Fair  Art therapist  Concentration: Good  Attention Span: Good  Recall: Fair  Fund of Knowledge: -- (Limited)  Language: Fair  Psychomotor Activity  Psychomotor Activity: No data recorded   Assets  Assets: Communication Skills; Desire for Improvement; Financial Resources/Insurance; Resilience; Social Support  Sleep  Sleep: No data recorded   Physical Exam: Physical Exam Vitals and nursing note reviewed.  Genitourinary:    Comments: Deferred Neurological:     General: No focal deficit present.     Mental Status: She is oriented to person, place,  and time.  Psychiatric:        Mood and Affect: Mood normal.        Behavior: Behavior normal.        Thought Content: Thought content normal.    Review of Systems  Constitutional:  Negative for fever.  HENT:  Negative for hearing loss.   Eyes:  Negative for blurred vision.  Respiratory:  Negative for cough.   Cardiovascular:  Negative for chest pain.  Gastrointestinal:  Negative for heartburn.  Genitourinary:  Negative for dysuria.  Musculoskeletal:  Negative for myalgias.  Skin:  Negative for rash.  Neurological:  Negative for dizziness.  Psychiatric/Behavioral:  Positive for depression. Negative for hallucinations, memory loss, substance abuse and suicidal ideas. The patient is not nervous/anxious and does not have insomnia.   All other systems reviewed and are negative.  Blood pressure 102/75, pulse 98, temperature 98.6 F (37 C), temperature source Oral, resp. rate 16, height 4\' 11"  (1.499 m), weight 63 kg, SpO2 100%. Body mass index is 28.05 kg/m.  Treatment Plan Summary: Daily contact with patient to assess and evaluate symptoms and progress in treatment and Medication management.    Still waiting on safe disposition as patient would not be able to live independently given cognitive impairment. Recommend assisted living.    No medication side effects reported.  Because of urinary incontinence, recommended adult diapers to be worn.   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  Plan:  -Continue Colace 100 mg daily for constipation -Continue Vitamin D 50.000 units weekly for bone health. -Continue Sertraline150 mg po Q daily for depression/anxiety -Continue Buspar 15 mg po bid for anxiety.  -Continue paliperidone 6 mg po qd for mood  stabilization -Continue Trazodone 50 mg nightly for insomnia  -Continue Nicoderm 21 mg topically Q 24 hrs for nicotine withdrawal management. -Continue vitamin B12 1000 mcg IM weekly for 4 weeks then monthly -Continue Melatonin 5 mg nightly for sleep -Continue Protonix EC 40 mg p.o. daily for GERD -Continue MiraLAX 17 g p.o. PRN for constipation -Continue hydroxyzine 25 mg p.o. 3 times daily as needed for anxiety -Continue Ensure nutritional shakes TID in between meals  -Previously discontinued Hydroxyzine 50 mg nightly-hypotension in the mornings   Safety and Monitoring: Voluntary admission to inpatient psychiatric unit for safety, stabilization and treatment Daily contact with patient to assess and evaluate symptoms and progress in treatment Patient's case to be discussed in multi-disciplinary team meeting Observation Level : q15 minute checks Vital signs: q12 hours Precautions: Safety   Discharge Planning: Social work and case management to assist with discharge planning and identification of hospital follow-up needs prior to discharge Estimated LOS: Unknown at this time. Discharge Concerns: Need to establish a safety plan; Medication compliance and effectiveness Discharge Goals: Return home with outpatient referrals for mental health follow-up including medication management/psychotherapy No legal guardian, has payee  Sarita Bottom, MD,  03/18/2023, 9:31 AM Patient ID: Larey Brick, female   DOB: January 28, 1976, 47 y.o.   MRN: [id number] Patient ID: ROSHELL ACEITUNO, female   DOB: October 13, 1975, 47 y.o.   MRN: [id number] Patient ID: NYEASHA VOLKERT, female   DOB: 11/13/1975, 47 y.o.   MRN: [id number] Patient ID: KELSHA BURFORD, female   DOB: Apr 27, 1976, 47 y.o.   MRN: [id number] Patient ID: JAVAN TARABOCCHIA, female   DOB: 1975/08/24, 47 y.o.   MRN: [id number] Patient  ID: Kirbie Oneal Grout, female   DOB: 09-Apr-1976, 47 y.o.   MRN: [id number]

## 2023-03-18 NOTE — Group Note (Addendum)
Recreation Therapy Group Note   Group Topic:Stress Management Group Date: 03/18/2023 Start Time: 0945 End Time: 1010 Facilitators: Takeru Bose-McCall, LRT,CTRS Location: 300 Hall Dayroom   Group Topic: Stress Management  Goal Area(s) Addresses:  Patient will identify positive stress management techniques. Patient will identify benefits of using stress management post d/c.  Group Description: Meditation.  LRT played a meditation that focused on being resilient in the face of struggle. Patients were to listen to the meditation as it played to engage. LRT also informed patients of being able to get more stress management resources from, apps, scripts, Youtube, internet, etc.   Education:  Stress Management, Discharge Planning.   Education Outcome: Acknowledges Education  Affect/Mood: N/A   Participation Level: Did not attend    Clinical Observations/Individualized Feedback:     Plan: Continue to engage patient in RT group sessions 2-3x/week.   Abdirahman Chittum-McCall, LRT,CTRS 03/18/2023 12:28 PM

## 2023-03-18 NOTE — Progress Notes (Signed)
   03/18/23 0528  15 Minute Checks  Location Bedroom  Visual Appearance Calm  Behavior Sleeping  Sleep (Behavioral Health Patients Only)  Calculate sleep? (Click Yes once per 24 hr at 0600 safety check) Yes  Documented sleep last 24 hours 7.25

## 2023-03-18 NOTE — Plan of Care (Signed)
  Problem: Activity: Goal: Interest or engagement in leisure activities will improve Outcome: Progressing   Problem: Coping: Goal: Coping ability will improve Outcome: Progressing   

## 2023-03-19 DIAGNOSIS — F251 Schizoaffective disorder, depressive type: Secondary | ICD-10-CM | POA: Diagnosis not present

## 2023-03-19 NOTE — Progress Notes (Signed)
   03/19/23 0606  15 Minute Checks  Location Bedroom  Visual Appearance Calm  Behavior Composed  Sleep (Behavioral Health Patients Only)  Calculate sleep? (Click Yes once per 24 hr at 0600 safety check) Yes  Documented sleep last 24 hours 7.5

## 2023-03-19 NOTE — Group Note (Signed)
Date:  03/19/2023 Time:  10:17 AM  Group Topic/Focus:  Goals Group:   The focus of this group is to help patients establish daily goals to achieve during treatment and discuss how the patient can incorporate goal setting into their daily lives to aide in recovery.    Participation Level:  Active  Participation Quality:  Appropriate  Affect:  Appropriate  Cognitive:  Appropriate  Insight: Appropriate  Engagement in Group:  Engaged  Modes of Intervention:  Discussion  Additional Comments:     [name] 03/19/2023, 10:17 AM

## 2023-03-19 NOTE — Group Note (Signed)
Recreation Therapy Group Note   Group Topic:Animal Assisted Therapy   Group Date: 03/19/2023 Start Time: 0945 End Time: 1030 Facilitators: Drianna Chandran-McCall, LRT,CTRS Location: [address] Dayroom   Animal-Assisted Activity (AAA) Program Checklist/Progress Notes Patient Eligibility Criteria Checklist & Daily Group note for Rec Tx Intervention  AAA/T Program Assumption of Risk Form signed by Patient/ or Parent Legal Guardian Yes  Patient understands his/her participation is voluntary Yes  Education: Charity fundraiser, Appropriate Animal Interaction   Education Outcome: Acknowledges education.    Affect/Mood: N/A   Participation Level: Did not attend    Clinical Observations/Individualized Feedback:     Plan: Continue to engage patient in RT group sessions 2-3x/week.   Peola Joynt-McCall, LRT,CTRS 03/19/2023 11:52 AM

## 2023-03-19 NOTE — Progress Notes (Signed)
D: Patient is alert and oriented. Denies SI, HI, AVH, and verbally contracts for safety.    A: Scheduled medications administered per MD order. Support provided. Patient educated on safety on the unit and medications. Routine safety checks every 15 minutes. Patient stated understanding to tell nurse about any new physical symptoms. Patient understands to tell staff of any needs.     R: No adverse drug reactions noted. Patient remains safe at this time and will continue to monitor.    03/19/23 1000  Psych Admission Type (Psych Patients Only)  Admission Status Involuntary  Psychosocial Assessment  Patient Complaints Anxiety  Eye Contact Fair  Facial Expression Animated  Affect Appropriate to circumstance  Speech Logical/coherent;Soft  Interaction Childlike  Motor Activity Slow  Appearance/Hygiene Improved  Behavior Characteristics Cooperative;Appropriate to situation  Mood Depressed;Anxious;Pleasant  Thought Process  Coherency WDL  Content WDL  Delusions None reported or observed  Perception WDL  Hallucination None reported or observed  Judgment Poor  Confusion None  Danger to Self  Current suicidal ideation? Denies  Self-Injurious Behavior No self-injurious ideation or behavior indicators observed or expressed   Agreement Not to Harm Self Yes  Description of Agreement verbal  Danger to Others  Danger to Others None reported or observed

## 2023-03-19 NOTE — Group Note (Unsigned)
Date:  03/19/2023 Time:  9:12 PM  Group Topic/Focus:  Wrap-Up Group:   The focus of this group is to help patients review their daily goal of treatment and discuss progress on daily workbooks.     Participation Level:  {BHH PARTICIPATION ZOXWR:60454}  Participation Quality:  {BHH PARTICIPATION QUALITY:22265}  Affect:  {BHH AFFECT:22266}  Cognitive:  {BHH COGNITIVE:22267}  Insight: {BHH Insight2:20797}  Engagement in Group:  {BHH ENGAGEMENT IN UJWJX:91478}  Modes of Intervention:  {BHH MODES OF INTERVENTION:22269}  Additional Comments:  ***  Scot Dock 03/19/2023, 9:12 PM

## 2023-03-19 NOTE — Progress Notes (Signed)
   03/18/23 2120  Psych Admission Type (Psych Patients Only)  Admission Status Involuntary  Psychosocial Assessment  Patient Complaints Anxiety  Eye Contact Fair  Facial Expression Animated  Affect Appropriate to circumstance  Speech Logical/coherent;Soft  Interaction Childlike  Motor Activity Slow  Appearance/Hygiene Disheveled  Behavior Characteristics Cooperative;Appropriate to situation  Mood Pleasant  Thought Process  Coherency WDL  Content WDL  Delusions None reported or observed  Perception WDL  Hallucination None reported or observed  Judgment Poor  Confusion None  Danger to Self  Current suicidal ideation? Denies  Agreement Not to Harm Self Yes  Description of Agreement verbal  Danger to Others  Danger to Others None reported or observed   Pt is pleasant and cooperative. Pt was offered support and encouragement. Pt was given scheduled medications. Q 15 minute checks were done for safety. Pt interacts well with peers and staff. Pt has no complaints.Pt receptive to treatment and safety maintained on unit.

## 2023-03-19 NOTE — Group Note (Signed)
Date:  03/19/2023 Time:  9:56 PM  Group Topic/Focus:  Wrap-Up Group:   The focus of this group is to help patients review their daily goal of treatment and discuss progress on daily workbooks.    Participation Level:  Active  Participation Quality:  Appropriate  Affect:  Appropriate  Cognitive:  Appropriate  Insight: Appropriate  Engagement in Group:  Engaged  Modes of Intervention:  Education and Exploration  Additional Comments:  Patient attended and participated in group tonight. She reports that she has friends here. People shows that she care.  Lita Mains [facility] 03/19/2023, 9:56 PM

## 2023-03-19 NOTE — Progress Notes (Signed)
[facility] MD Progress Note  03/19/2023 1:03 PM Yolanda [name]  MRN:  [id number]  Principal Problem: Schizoaffective disorder, depressive type (HCC)  Diagnosis: Principal Problem:   Schizoaffective disorder, depressive type (HCC) Active Problems:   GERD (gastroesophageal reflux disease)   Intellectual disability   Tobacco use disorder  Reason for admission: This is the first psychiatric admission in this [facility] in 12 years for this 47 AA female with an extensive hx of mental illnesses & probable polysubstance use disorders. She is admitted to the [facility] from the [facility] hospital with complain of worsening suicidal ideations with plan to stab herself. Per chart review, patient apparently reported at the ED that she has been depressed for a while & has not been taking her mental health medications. After medical evaluation.clearance, she was transferred to the [facility] for further psychiatric evaluation/treatments.   24 hr chart review: Sleep Hours last night: 7.25 Nursing Concerns: None noted. Behavioral episodes in the past 24 hrs: none reported.  Medication Compliance: Compliant  Vital Signs in the past 24 hrs: WNL PRN Medications in the past 24 hrs: Atarax prn for anxiety                                           Patient assessment note:  Patient was seen in the room and case was discussed the treatment team.  On interview patient was found laying in bed doing some crossword puzzles.  She states that she enjoys them and has not been going to groups consistently because she has been to most of the groups in the past.  She reports that she slept well and denies any active SI/HI/AVH.  She is still looking forward to disposition and has been cooperative.  She denies any side effects on medications.  She did not show any signs or symptoms of EPS/TD.  The plan is to explore group homes/assisted living facilities and to consider if she would benefit from going to a IDD facility.  Total Time spent with  patient:  35 minutes  Past Psychiatric History:  See H & P  Past Medical History:  Past Medical History:  Diagnosis Date   Bipolar affect, depressed (HCC)    Constipation 08/17/2022   Depression    Falls 07/21/2022   Fracture of femoral neck, right, closed (HCC) 01/17/2022   Herpes simplex 08/22/2017   Open fracture dislocation of right elbow joint 01/17/2022    Past Surgical History:  Procedure Laterality Date   NO PAST SURGERIES     SALPINGECTOMY     Family History: History reviewed. No pertinent family history.  Family Psychiatric  History: See H & P  Social History:  Social History   Substance and Sexual Activity  Alcohol Use Yes     Social History   Substance and Sexual Activity  Drug Use Yes   Types: Cocaine, Marijuana    Social History   Socioeconomic History   Marital status: Single    Spouse name: Not on file   Number of children: Not on file   Years of education: Not on file   Highest education level: Not on file  Occupational History   Not on file  Tobacco Use   Smoking status: Every Day   Smokeless tobacco: Not on file  Substance and Sexual Activity   Alcohol use: Yes   Drug use: Yes    Types: Cocaine,  Marijuana   Sexual activity: Yes  Other Topics Concern   Not on file  Social History Narrative   Not on file   Social Determinants of Health   Financial Resource Strain: Low Risk  (09/12/2022)   Received from [facility], [facility]   Overall Financial Resource Strain (CARDIA)    Difficulty of Paying Living Expenses: Not hard at all  Food Insecurity: Patient Declined (12/20/2022)   Hunger Vital Sign    Worried About Running Out of Food in the Last Year: Patient declined    Ran Out of Food in the Last Year: Patient declined  Transportation Needs: No Transportation Needs (12/20/2022)   PRAPARE - Administrator, Civil Service (Medical): No    Lack of Transportation (Non-Medical): No  Physical Activity: Not on file  Stress:  No Stress Concern Present (07/17/2022)   Received from [facility], [facility] of Occupational Health - Occupational Stress Questionnaire    Feeling of Stress : Not at all  Social Connections: Unknown (07/16/2022)   Received from [facility], [facility]   Social Network    Social Network: Not on file   Sleep: Good  Appetite:  Good  Current Medications: Current Facility-Administered Medications  Medication Dose Route Frequency Provider Last Rate Last Admin   acetaminophen (TYLENOL) tablet 650 mg  650 mg Oral Q6H PRN [name], NP   650 mg at 03/11/23 2145   alum & mag hydroxide-simeth (MAALOX/MYLANTA) 200-200-20 MG/5ML suspension 30 mL  30 mL Oral Q4H PRN [name], NP   30 mL at 01/28/23 1530   busPIRone (BUSPAR) tablet 15 mg  15 mg Oral BID [name], NP   15 mg at 03/19/23 9528   cyanocobalamin (VITAMIN B12) injection 1,000 mcg  1,000 mcg Intramuscular Q30 days [name], Nadir, MD   1,000 mcg at 03/04/23 1421   diphenhydrAMINE (BENADRYL) capsule 50 mg  50 mg Oral TID PRN [name], NP   50 mg at 01/15/23 1211   Or   diphenhydrAMINE (BENADRYL) injection 50 mg  50 mg Intramuscular TID PRN [name], NP       feeding supplement (ENSURE ENLIVE / ENSURE PLUS) liquid 237 mL  237 mL Oral TID BM Nkwenti, Doris, NP   237 mL at 03/19/23 0912   haloperidol (HALDOL) tablet 5 mg  5 mg Oral TID PRN [name], NP   5 mg at 01/15/23 1211   Or   haloperidol lactate (HALDOL) injection 5 mg  5 mg Intramuscular TID PRN [name], NP       hydrOXYzine (ATARAX) tablet 25 mg  25 mg Oral TID PRN [name], DO   25 mg at 03/17/23 2106   LORazepam (ATIVAN) tablet 2 mg  2 mg Oral TID PRN [name], NP   2 mg at 01/15/23 1211   Or   LORazepam (ATIVAN) injection 2 mg  2 mg Intramuscular TID PRN [name], NP       magnesium hydroxide (MILK OF MAGNESIA) suspension 30 mL  30 mL Oral Daily PRN [name], NP   30 mL at 02/26/23 1828   melatonin  tablet 5 mg  5 mg Oral QHS Nkwenti, Doris, NP   5 mg at 03/18/23 2120   nicotine (NICODERM CQ - dosed in mg/24 hours) patch 21 mg  21 mg Transdermal Daily [name], DO   21 mg at 03/06/23 0819   paliperidone (INVEGA) 24 hr tablet 6 mg  6 mg Oral Daily [name], NP   6 mg at 03/18/23 2120   pantoprazole (PROTONIX) EC tablet 40 mg  40 mg Oral Daily [name], NP   40 mg at 03/19/23 0909   polyethylene glycol (MIRALAX / GLYCOLAX) packet 17 g  17 g Oral Daily PRN [name], NP   17 g at 03/12/23 1953   sertraline (ZOLOFT) tablet 150 mg  150 mg Oral Daily Massengill, Harrold Donath, MD   150 mg at 03/19/23 4098   traZODone (DESYREL) tablet 50 mg  50 mg Oral QHS [name], NP   50 mg at 03/18/23 2120   Vitamin D (Ergocalciferol) (DRISDOL) 1.25 MG (50000 UNIT) capsule 50,000 Units  50,000 Units Oral Q7 days [name], NP   50,000 Units at 03/16/23 1621   Lab Results:  No results found for this or any previous visit (from the past 48 hour(s)).  Blood Alcohol level:  Lab Results  Component Value Date   ETH <10 12/19/2022   ETH <10 11/21/2019   Metabolic Disorder Labs: Lab Results  Component Value Date   HGBA1C 4.4 (L) 12/21/2022   MPG 79.58 12/21/2022   No results found for: "PROLACTIN" Lab Results  Component Value Date   CHOL 218 (H) 12/21/2022   TRIG 81 12/21/2022   HDL 53 12/21/2022   CHOLHDL 4.1 12/21/2022   VLDL 16 12/21/2022   LDLCALC 149 (H) 12/21/2022   LDLCALC 110 (H) 03/13/2011   Physical Findings: AIMS: Facial and Oral Movements Muscles of Facial Expression: None, normal Lips and Perioral Area: None, normal Jaw: None, normal Tongue: None, normal,Extremity Movements Upper (arms, wrists, hands, fingers): None, normal Lower (legs, knees, ankles, toes): None, normal, Trunk Movements Neck, shoulders, hips: None, normal, Overall Severity Severity of abnormal movements (highest score from questions above): None, normal Incapacitation due to abnormal  movements: None, normal Patient's awareness of abnormal movements (rate only patient's report): No Awareness, Dental Status Current problems with teeth and/or dentures?: No Does patient usually wear dentures?: No  CIWA:    COWS:    AIMS:0 Musculoskeletal: Strength & Muscle Tone: within normal limits Gait & Station: normal Patient leans: N/A  Psychiatric Specialty Exam:  Presentation  General Appearance:  Casual; Disheveled  Eye Contact: Limited to baseline Speech: Decreased amount, decreased tone and volume but at baseline Speech Volume: Decreased  Handedness: Right  Mood and Affect  Mood: Sad and depressed mainly secondary to being in the hospital with no place to go Affect: Restricted  Thought Process  Thought Processes: Coherent  Descriptions of Associations:Intact  Orientation:Full (Time, Place and Person)  Thought Content:Perseveration; Rumination Concrete History of Schizophrenia/Schizoaffective disorder:No  Duration of Psychotic Symptoms:Greater than six months  Hallucinations:Hallucinations: None  Denies AVH, does not appear responding to stimuli  Ideas of Reference: None  Suicidal Thoughts:Suicidal Thoughts: Yes, Passive SI Active Intent and/or Plan: Without Intent; Without Plan  Denies passive or active SI intention or plan   Homicidal Thoughts:Homicidal Thoughts: No  Denies HI  Sensorium  Memory: Immediate Fair; Remote Fair; Recent Fair  Judgment: Poor  Insight: Fair  Chartered certified accountant: Fair  Attention Span: Fair  Recall: [employer] of Knowledge: Fair  Language: Fair  Psychomotor Activity  Psychomotor Activity: Psychomotor Activity: Normal    Assets  Assets: Desire for Improvement; Resilience  Sleep  Sleep: Sleep: Good Number of Hours of Sleep: 7.5    Physical Exam: Physical Exam Vitals and nursing note reviewed.  Genitourinary:    Comments: Deferred Neurological:  General:  No focal deficit present.     Mental Status: She is oriented to person, place, and time.  Psychiatric:        Mood and Affect: Mood normal.        Behavior: Behavior normal.        Thought Content: Thought content normal.    Review of Systems  Constitutional:  Negative for fever.  HENT:  Negative for hearing loss.   Eyes:  Negative for blurred vision.  Respiratory:  Negative for cough.   Cardiovascular:  Negative for chest pain.  Gastrointestinal:  Negative for heartburn.  Genitourinary:  Negative for dysuria.  Musculoskeletal:  Negative for myalgias.  Skin:  Negative for rash.  Neurological:  Negative for dizziness.  Psychiatric/Behavioral:  Positive for depression. Negative for hallucinations, memory loss, substance abuse and suicidal ideas. The patient is not nervous/anxious and does not have insomnia.   All other systems reviewed and are negative.  Blood pressure 107/71, pulse 64, temperature 98 F (36.7 C), temperature source Oral, resp. rate 18, height 4\' 11"  (1.499 m), weight 63 kg, SpO2 96%. Body mass index is 28.05 kg/m.  Treatment Plan Summary: Daily contact with patient to assess and evaluate symptoms and progress in treatment and Medication management.    Still waiting on safe disposition as patient would not be able to live independently given cognitive impairment. Recommend assisted living.    No medication side effects reported.  Because of urinary incontinence, recommended adult diapers to be worn.   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  Plan:  -Continue Colace 100 mg daily for constipation -Continue Vitamin D 50.000 units weekly for bone health. -Continue Sertraline150 mg po Q daily for depression/anxiety -Continue  Buspar 15 mg po bid for anxiety.  -Continue paliperidone 6 mg po qd for mood stabilization -Continue Trazodone 50 mg nightly for insomnia  -Continue Nicoderm 21 mg topically Q 24 hrs for nicotine withdrawal management. -Continue vitamin B12 1000 mcg IM weekly for 4 weeks then monthly -Continue Melatonin 5 mg nightly for sleep -Continue Protonix EC 40 mg p.o. daily for GERD -Continue MiraLAX 17 g p.o. PRN for constipation -Continue hydroxyzine 25 mg p.o. 3 times daily as needed for anxiety -Continue Ensure nutritional shakes TID in between meals  -Previously discontinued Hydroxyzine 50 mg nightly-hypotension in the mornings   Safety and Monitoring: Voluntary admission to inpatient psychiatric unit for safety, stabilization and treatment Daily contact with patient to assess and evaluate symptoms and progress in treatment Patient's case to be discussed in multi-disciplinary team meeting Observation Level : q15 minute checks Vital signs: q12 hours Precautions: Safety   Discharge Planning: Social work and case management to assist with discharge planning and identification of hospital follow-up needs prior to discharge Estimated LOS: Unknown at this time. Discharge Concerns: Need to establish a safety plan; Medication compliance and effectiveness Discharge Goals: Return home with outpatient referrals for mental health follow-up including medication management/psychotherapy No legal guardian, has payee  [name], MD,  03/19/2023, 1:03 PM Patient ID: Yolanda [name], female   DOB: Aug 02, 1975, 47 y.o.   MRN: [id number]

## 2023-03-19 NOTE — Plan of Care (Signed)
  Problem: Coping: Goal: Will verbalize feelings Outcome: Progressing   Problem: Safety: Goal: Periods of time without injury will increase Outcome: Progressing   

## 2023-03-19 NOTE — Group Note (Signed)
LCSW Group Therapy Note  Group Date: 03/19/2023 Start Time: 1100 End Time: 1200   Type of Therapy and Topic:  Group Therapy - Healthy vs Unhealthy Coping Skills  Participation Level:  Did Not Attend   Description of Group The focus of this group was to determine what unhealthy coping techniques typically are used by group members and what healthy coping techniques would be helpful in coping with various problems. Patients were guided in becoming aware of the differences between healthy and unhealthy coping techniques. Patients were asked to identify 2-3 healthy coping skills they would like to learn to use more effectively.  Therapeutic Goals Patients learned that coping is what human beings do all day long to deal with various situations in their lives Patients defined and discussed healthy vs unhealthy coping techniques Patients identified their preferred coping techniques and identified whether these were healthy or unhealthy Patients determined 2-3 healthy coping skills they would like to become more familiar with and use more often. Patients provided support and ideas to each other    Therapeutic Modalities Cognitive Behavioral Therapy Motivational Interviewing  Ane Payment, LCSW 03/19/2023  11:57 AM

## 2023-03-20 DIAGNOSIS — F251 Schizoaffective disorder, depressive type: Secondary | ICD-10-CM | POA: Diagnosis not present

## 2023-03-20 MED ORDER — NICOTINE POLACRILEX 2 MG MT GUM
2.0000 mg | CHEWING_GUM | OROMUCOSAL | Status: DC | PRN
  Administered 2023-03-20 – 2023-08-09 (×131): 2 mg via ORAL
  Filled 2023-03-20 (×66): qty 1

## 2023-03-20 MED ORDER — SUMATRIPTAN SUCCINATE 25 MG PO TABS
25.0000 mg | ORAL_TABLET | Freq: Two times a day (BID) | ORAL | Status: DC | PRN
  Administered 2023-03-20 – 2023-07-23 (×9): 25 mg via ORAL
  Filled 2023-03-20 (×9): qty 1

## 2023-03-20 NOTE — Progress Notes (Signed)
PRN imitrex administered at 1603 for headache rated 9/10. Upon reassessment for effectiveness patient rates pain 0/10.

## 2023-03-20 NOTE — Progress Notes (Signed)
   03/19/23 2116  Psych Admission Type (Psych Patients Only)  Admission Status Involuntary  Psychosocial Assessment  Patient Complaints None  Eye Contact Fair  Facial Expression Animated  Affect Appropriate to circumstance  Speech Logical/coherent;Soft  Interaction Childlike  Motor Activity Slow  Appearance/Hygiene Disheveled  Behavior Characteristics Cooperative;Appropriate to situation  Mood Pleasant  Thought Process  Coherency WDL  Content WDL  Delusions None reported or observed  Perception WDL  Hallucination None reported or observed  Judgment Poor  Confusion None  Danger to Self  Current suicidal ideation? Denies  Agreement Not to Harm Self Yes  Description of Agreement verbal  Danger to Others  Danger to Others None reported or observed   Pt is pleasant and cooperative. Pt was offered support and encouragement. Pt was given scheduled medications. Pt complained of constipation, given PRN Miralax per MAR.  Q 15 minute checks were done for safety. Pt attended group and interacts well with peers and staff.Pt receptive to treatment and safety maintained on unit.

## 2023-03-20 NOTE — Progress Notes (Signed)
D: Patient is alert and oriented. Denies SI, HI, VH, and verbally contracts for safety. Patient reports AH of voices telling her to kill herself to this RN but she denies this to the provider.    A: Scheduled medications administered per MD order. PRN tylenol, imitrex, and nicotine gum administered (please see previous progress notes). Support provided. Patient educated on safety on the unit and medications. Routine safety checks every 15 minutes. Patient stated understanding to tell nurse about any new physical symptoms. Patient understands to tell staff of any needs.     R: No adverse drug reactions noted. Patient verbally contracts for safety. Patient remains safe at this time and will continue to monitor.    03/20/23 1000  Psych Admission Type (Psych Patients Only)  Admission Status Involuntary  Psychosocial Assessment  Patient Complaints Depression  Eye Contact Fair  Facial Expression Animated;Sad  Affect Appropriate to circumstance  Speech Logical/coherent;Soft  Interaction Childlike  Motor Activity Slow  Appearance/Hygiene Disheveled  Behavior Characteristics Cooperative;Appropriate to situation  Mood Depressed;Sad  Thought Process  Coherency WDL  Content Blaming self  Delusions None reported or observed  Perception Hallucinations  Hallucination Auditory;Command ([facility] telling her to kill herself)  Judgment Poor  Confusion None  Danger to Self  Current suicidal ideation? Denies  Self-Injurious Behavior No self-injurious ideation or behavior indicators observed or expressed   Agreement Not to Harm Self Yes  Description of Agreement verbal  Danger to Others  Danger to Others None reported or observed

## 2023-03-20 NOTE — Progress Notes (Signed)
PRN miralax given for complaint of constipation.

## 2023-03-20 NOTE — Group Note (Signed)
Recreation Therapy Group Note   Group Topic:Other  Group Date: 03/20/2023 Start Time: 0934 End Time: 1004 Facilitators: Lavaeh Bau-McCall, LRT,CTRS Location: [address] Dayroom   Goal Area(s) Addresses:  Patient will identify positive leisure activities.  Patient will identify one positive benefit of participation in leisure activities.   Intervention: Leisure Group Game  Group Descriptio: Patient, MHT, and LRT participated in playing movie trivia. Patients were divided into three teams. LRT read a question and each team were to guess the answer. For each correct answer, the team got a point. The team with the most points won.  Education: Leisure Exposure, Pharmacist, community, Discharge Planning  Education Outcome: Acknowledges education/In group clarification offered/Needs additional education   Affect/Mood: Flat   Participation Level: None   Participation Quality: None   Behavior: Withdrawn   Speech/Thought Process: None   Insight: None   Judgement: None   Modes of Intervention: Competitive Play   Patient Response to Interventions:  Resistant    Education Outcome:  In group clarification offered    Clinical Observations/Individualized Feedback: Pt came to group but didn't participate in activity.     Plan: Continue to engage patient in RT group sessions 2-3x/week.   Storey Stangeland-McCall, LRT,CTRS 03/20/2023 12:27 PM

## 2023-03-20 NOTE — Progress Notes (Signed)
PRN tylenol administered at 0913 for a headache rated 10/10. Upon reassessment for effectiveness patient rates pain 9/10.

## 2023-03-20 NOTE — Progress Notes (Signed)
PRN tylenol given at 1719 for low back pain rated 10/10. Upon reassessment for effectiveness patient still rates pain 10/10.

## 2023-03-20 NOTE — Plan of Care (Signed)
  Problem: Activity: Goal: Interest or engagement in leisure activities will improve Outcome: Progressing   Problem: Coping: Goal: Will verbalize feelings Outcome: Progressing   Problem: Self-Concept: Goal: Will verbalize positive feelings about self Outcome: Progressing   Problem: Health Behavior/Discharge Planning: Goal: Compliance with therapeutic regimen will improve Outcome: Progressing   Problem: Medication: Goal: Compliance with prescribed medication regimen will improve Outcome: Progressing   Problem: Safety: Goal: Periods of time without injury will increase Outcome: Progressing

## 2023-03-20 NOTE — Progress Notes (Signed)
   03/20/23 0600  15 Minute Checks  Location Bedroom  Visual Appearance Calm  Behavior Sleeping  Sleep (Behavioral Health Patients Only)  Calculate sleep? (Click Yes once per 24 hr at 0600 safety check) Yes  Documented sleep last 24 hours 9.5

## 2023-03-20 NOTE — Progress Notes (Addendum)
[facility] MD Progress Note  03/20/2023 12:59 PM Yolanda [name]  MRN:  [id number]  Principal Problem: Schizoaffective disorder, depressive type (HCC)  Diagnosis: Principal Problem:   Schizoaffective disorder, depressive type (HCC) Active Problems:   GERD (gastroesophageal reflux disease)   Intellectual disability   Tobacco use disorder  Reason for admission: This is the first psychiatric admission in this [facility] in 12 years for this 1 AA female with an extensive hx of mental illnesses & probable polysubstance use disorders. She is admitted to the [facility] from the [facility] hospital with complain of worsening suicidal ideations with plan to stab herself. Per chart review, patient apparently reported at the ED that she has been depressed for a while & has not been taking her mental health medications. After medical evaluation.clearance, she was transferred to the [facility] for further psychiatric evaluation/treatments.   24 hr chart review: Sleep Hours last night: 9.5 hrs as per nursing documentation Nursing Concerns: None noted. Behavioral episodes in the past 24 hrs: none reported.  Medication Compliance: Compliant  Vital Signs in the past 24 hrs: WNL PRN Medications in the past 24 hrs: Tylenol, Miralax,   Patient assessment note:  On assessment today, the pt reports that their mood is still depressed, but moderately improved since admission', rates depression 5, with 10 being worst. Reports that anxiety is still somewhat elevated, and rates it a 5 as well, with 10 being worst. Sleep is fair, states she had trouble staying asleep last night. Educated on the need to get out of her room during the day and stay in the day room so as to be able to sleep at night. Appetite is fair Concentration is poor   Energy level is low Denies suicidal thoughts. Denies suicidal intent and plan.  Denies having any HI. Denied SI to Clinical research associate on initial encounter today, verbalized SI to her assigned RN which prompted writer  to go back to her room for a reassessment, and she again denies SI during second encounter. Denies having psychotic symptoms. Denies AVH, denies paranoia and denies all first rank symptoms. Reports that she is having migraines persistently on a daily basis, and that Tylenol is not helpful.  Denies having side effects to current psychiatric medications.  We discussed changes to current medication regimen, including adding Imitrex as needed for migraines, and patient verbalized understanding.  Educated on benefits, rationales, and possible side effects of this medication.  Continuing other medications as listed below.   Discussed the following psychosocial stressors: Continuous hospitalization due to lack of safe housing outside of the hospital. Educated on the need to take care of personal hygiene needs.  Educated that we are still awaiting placement by DSS and coordination with CSW.  Empathy and active listening provided. Total Time spent with patient:  35 minutes  Past Psychiatric History:  See H & P  Past Medical History:  Past Medical History:  Diagnosis Date   Bipolar affect, depressed (HCC)    Constipation 08/17/2022   Depression    Falls 07/21/2022   Fracture of femoral neck, right, closed (HCC) 01/17/2022   Herpes simplex 08/22/2017   Open fracture dislocation of right elbow joint 01/17/2022    Past Surgical History:  Procedure Laterality Date   NO PAST SURGERIES     SALPINGECTOMY     Family History: History reviewed. No pertinent family history.  Family Psychiatric  History: See H & P  Social History:  Social History   Substance and Sexual Activity  Alcohol Use Yes  Social History   Substance and Sexual Activity  Drug Use Yes   Types: Cocaine, Marijuana    Social History   Socioeconomic History   Marital status: Single    Spouse name: Not on file   Number of children: Not on file   Years of education: Not on file   Highest education level: Not on file   Occupational History   Not on file  Tobacco Use   Smoking status: Every Day   Smokeless tobacco: Not on file  Substance and Sexual Activity   Alcohol use: Yes   Drug use: Yes    Types: Cocaine, Marijuana   Sexual activity: Yes  Other Topics Concern   Not on file  Social History Narrative   Not on file   Social Determinants of Health   Financial Resource Strain: Low Risk  (09/12/2022)   Received from [facility]   Overall Financial Resource Strain (CARDIA)    Difficulty of Paying Living Expenses: Not hard at all  Food Insecurity: Patient Declined (12/20/2022)   Hunger Vital Sign    Worried About Running Out of Food in the Last Year: Patient declined    Ran Out of Food in the Last Year: Patient declined  Transportation Needs: No Transportation Needs (12/20/2022)   PRAPARE - Administrator, Civil Service (Medical): No    Lack of Transportation (Non-Medical): No  Physical Activity: Not on file  Stress: No Stress Concern Present (07/17/2022)   Received from [facility] - Occupational Stress Questionnaire    Feeling of Stress : Not at all  Social Connections: Unknown (07/16/2022)   Received from [facility]   Social Network    Social Network: Not on file   Sleep: Good  Appetite:  Good  Current Medications: Current Facility-Administered Medications  Medication Dose Route Frequency Provider Last Rate Last Admin   acetaminophen (TYLENOL) tablet 650 mg  650 mg Oral Q6H PRN [name], NP   650 mg at 03/20/23 0913   alum & mag hydroxide-simeth (MAALOX/MYLANTA) 200-200-20 MG/5ML suspension 30 mL  30 mL Oral Q4H PRN [name], NP   30 mL at 01/28/23 1530   busPIRone (BUSPAR) tablet 15 mg  15 mg Oral BID [name], NP   15 mg at 03/20/23 0825   cyanocobalamin (VITAMIN B12) injection 1,000 mcg  1,000 mcg Intramuscular Q30 days [name], Nadir, MD   1,000 mcg at 03/04/23 1421    diphenhydrAMINE (BENADRYL) capsule 50 mg  50 mg Oral TID PRN [name], NP   50 mg at 01/15/23 1211   Or   diphenhydrAMINE (BENADRYL) injection 50 mg  50 mg Intramuscular TID PRN [name], NP       feeding supplement (ENSURE ENLIVE / ENSURE PLUS) liquid 237 mL  237 mL Oral TID BM Jenny Omdahl, NP   237 mL at 03/20/23 1012   haloperidol (HALDOL) tablet 5 mg  5 mg Oral TID PRN [name], NP   5 mg at 01/15/23 1211   Or   haloperidol lactate (HALDOL) injection 5 mg  5 mg Intramuscular TID PRN [name], NP       hydrOXYzine (ATARAX) tablet 25 mg  25 mg Oral TID PRN [name], DO   25 mg at 03/17/23 2106   LORazepam (ATIVAN) tablet 2 mg  2 mg Oral TID PRN [name], NP   2 mg at 01/15/23 1211  Or   LORazepam (ATIVAN) injection 2 mg  2 mg Intramuscular TID PRN [name], NP       magnesium hydroxide (MILK OF MAGNESIA) suspension 30 mL  30 mL Oral Daily PRN [name], NP   30 mL at 02/26/23 1828   melatonin tablet 5 mg  5 mg Oral QHS Thang Flett, NP   5 mg at 03/19/23 2115   nicotine (NICODERM CQ - dosed in mg/24 hours) patch 21 mg  21 mg Transdermal Daily [name], DO   21 mg at 03/06/23 1610   nicotine polacrilex (NICORETTE) gum 2 mg  2 mg Oral PRN [name], MD       paliperidone (INVEGA) 24 hr tablet 6 mg  6 mg Oral Daily Ahmod Gillespie, NP   6 mg at 03/19/23 2116   pantoprazole (PROTONIX) EC tablet 40 mg  40 mg Oral Daily [name], NP   40 mg at 03/20/23 0825   polyethylene glycol (MIRALAX / GLYCOLAX) packet 17 g  17 g Oral Daily PRN [name], NP   17 g at 03/19/23 2040   sertraline (ZOLOFT) tablet 150 mg  150 mg Oral Daily Massengill, Harrold Donath, MD   150 mg at 03/20/23 0825   SUMAtriptan (IMITREX) tablet 25 mg  25 mg Oral BID PRN [name], NP       traZODone (DESYREL) tablet 50 mg  50 mg Oral QHS [name], NP   50 mg at 03/19/23 2116   Vitamin D (Ergocalciferol) (DRISDOL) 1.25 MG (50000 UNIT) capsule 50,000 Units   50,000 Units Oral Q7 days [name], NP   50,000 Units at 03/16/23 1621   Lab Results:  No results found for this or any previous visit (from the past 48 hour(s)).  Blood Alcohol level:  Lab Results  Component Value Date   ETH <10 12/19/2022   ETH <10 11/21/2019   Metabolic Disorder Labs: Lab Results  Component Value Date   HGBA1C 4.4 (L) 12/21/2022   MPG 79.58 12/21/2022   No results found for: "PROLACTIN" Lab Results  Component Value Date   CHOL 218 (H) 12/21/2022   TRIG 81 12/21/2022   HDL 53 12/21/2022   CHOLHDL 4.1 12/21/2022   VLDL 16 12/21/2022   LDLCALC 149 (H) 12/21/2022   LDLCALC 110 (H) 03/13/2011   Physical Findings: AIMS: Facial and Oral Movements Muscles of Facial Expression: None, normal Lips and Perioral Area: None, normal Jaw: None, normal Tongue: None, normal,Extremity Movements Upper (arms, wrists, hands, fingers): None, normal Lower (legs, knees, ankles, toes): None, normal, Trunk Movements Neck, shoulders, hips: None, normal, Overall Severity Severity of abnormal movements (highest score from questions above): None, normal Incapacitation due to abnormal movements: None, normal Patient's awareness of abnormal movements (rate only patient's report): No Awareness, Dental Status Current problems with teeth and/or dentures?: No Does patient usually wear dentures?: No  CIWA:    COWS:    AIMS:0 Musculoskeletal: Strength & Muscle Tone: within normal limits Gait & Station: normal Patient leans: N/A  Psychiatric Specialty Exam:  Presentation  General Appearance:  Casual  Eye Contact: Limited to baseline Speech: Decreased amount, decreased tone and volume but at baseline Speech Volume: Normal  Handedness: Right  Mood and Affect  Mood: Sad and depressed mainly secondary to being in the hospital with no place to go Affect: Congruent  Thought Process  Thought Processes: Coherent  Descriptions of  Associations:Intact  Orientation:Partial  Thought Content:Logical [place]crete History of Schizophrenia/Schizoaffective disorder:Yes  Duration of Psychotic Symptoms:Greater  than six months  Hallucinations:Hallucinations: None  Denies AVH, does not appear responding to stimuli  Ideas of Reference: None  Suicidal Thoughts:Suicidal Thoughts: No SI Active Intent and/or Plan: Without Intent; Without Plan  Denies passive or active SI intention or plan   Homicidal Thoughts:Homicidal Thoughts: No  Denies HI  Sensorium  Memory: Immediate Good  Judgment: Fair  Insight: Fair  Chartered certified accountant: Fair  Attention Span: Fair  Recall: Poor  Fund of Knowledge: Poor  Language: Fair  Psychomotor Activity  Psychomotor Activity: Psychomotor Activity: Normal    Assets  Assets: Resilience  Sleep  Sleep: Sleep: Fair Number of Hours of Sleep: 7.5    Physical Exam: Physical Exam Vitals and nursing note reviewed.  Genitourinary:    Comments: Deferred Neurological:     General: No focal deficit present.     Mental Status: She is oriented to person, place, and time.  Psychiatric:        Mood and Affect: Mood normal.        Behavior: Behavior normal.        Thought Content: Thought content normal.    Review of Systems  Constitutional:  Negative for fever.  HENT:  Negative for hearing loss.   Eyes:  Negative for blurred vision.  Respiratory:  Negative for cough.   Cardiovascular:  Negative for chest pain.  Gastrointestinal:  Negative for heartburn.  Genitourinary:  Negative for dysuria.  Musculoskeletal:  Negative for myalgias.  Skin:  Negative for rash.  Neurological:  Negative for dizziness.  Psychiatric/Behavioral:  Positive for depression. Negative for hallucinations, memory loss, substance abuse and suicidal ideas. The patient is nervous/anxious and has insomnia.   All other systems reviewed and are negative.  Blood pressure 108/75,  pulse 86, temperature 97.6 F (36.4 C), temperature source Oral, resp. rate 18, height 4\' 11"  (1.499 m), weight 63 kg, SpO2 99%. Body mass index is 28.05 kg/m.  Treatment Plan Summary: Daily contact with patient to assess and evaluate symptoms and progress in treatment and Medication management.    Still waiting on safe disposition as patient would not be able to live independently given cognitive impairment. Recommend assisted living.    No medication side effects reported.  Because of urinary incontinence, recommended adult diapers to be worn.   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  Plan:  -Start Imitrex 25 mg PRN BID for migraines -Continue Colace 100 mg daily for constipation -Continue Vitamin D 50.000 units weekly for bone health. -Continue Sertraline150 mg po Q daily for depression/anxiety -Continue Buspar 15 mg po bid for anxiety.  -Continue paliperidone 6 mg po qd for mood stabilization -Continue Trazodone 50 mg nightly for insomnia  -Continue Nicoderm 21 mg topically Q 24 hrs for nicotine withdrawal management. -Continue vitamin B12 1000 mcg IM weekly for 4 weeks then monthly -Continue Melatonin 5 mg nightly for sleep -Continue Protonix EC 40 mg p.o. daily for GERD -Continue MiraLAX 17 g p.o. PRN for constipation -Continue hydroxyzine 25 mg p.o. 3 times daily as needed for anxiety -Continue Ensure nutritional shakes TID in between meals  -Previously discontinued Hydroxyzine 50 mg nightly-hypotension in the mornings   Safety and Monitoring: Voluntary admission to inpatient psychiatric unit for safety, stabilization and treatment Daily contact with patient to assess and evaluate symptoms and progress in treatment Patient's case to be  discussed in multi-disciplinary  team meeting Observation Level : q15 minute checks Vital signs: q12 hours Precautions: Safety   Discharge Planning: Social work and case management to assist with discharge planning and identification of hospital follow-up needs prior to discharge Estimated LOS: Unknown at this time. Discharge Concerns: Need to establish a safety plan; Medication compliance and effectiveness Discharge Goals: Return home with outpatient referrals for mental health follow-up including medication management/psychotherapy No legal guardian, has payee  [name], NP,  03/20/2023, 12:59 PM Patient ID: Yolanda [name], female   DOB: 07/10/1975, 47 y.o.   MRN: [id number]

## 2023-03-20 NOTE — B[facility] Group Notes (Signed)
Adult Psychoeducational Group Note  Date:  03/20/2023 Time:  9:35 PM  Group Topic/Focus:  Wrap-Up Group:   The focus of this group is to help patients review their daily goal of treatment and discuss progress on daily workbooks.  Participation Level:  Active  Participation Quality:  Attentive  Affect:  Appropriate  Cognitive:  Alert  Insight: Appropriate  Engagement in Group:  Engaged  Modes of Intervention:  Discussion  Additional Comments:  Patient attended and participated in the Wrap-up group.  [name] 03/20/2023, 9:35 PM
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
## 2023-03-21 DIAGNOSIS — F251 Schizoaffective disorder, depressive type: Secondary | ICD-10-CM | POA: Diagnosis not present

## 2023-03-21 NOTE — Progress Notes (Signed)
   03/21/23 0125  Psych Admission Type (Psych Patients Only)  Admission Status Involuntary  Psychosocial Assessment  Patient Complaints Depression  Eye Contact Fair  Facial Expression Animated  Affect Appropriate to circumstance  Speech Logical/coherent  Interaction Childlike  Motor Activity Slow  Appearance/Hygiene Disheveled  Behavior Characteristics Appropriate to situation  Mood Depressed;Sad  Thought Process  Coherency WDL  Content WDL  Delusions None reported or observed  Perception WDL  Hallucination None reported or observed  Judgment Poor  Confusion None  Danger to Self  Current suicidal ideation? Denies  Self-Injurious Behavior No self-injurious ideation or behavior indicators observed or expressed   Agreement Not to Harm Self Yes  Description of Agreement verbal  Danger to Others  Danger to Others None reported or observed

## 2023-03-21 NOTE — Group Note (Signed)
Date:  03/21/2023 Time:  9:54 PM  Group Topic/Focus:  Wrap-Up Group:   The focus of this group is to help patients review their daily goal of treatment and discuss progress on daily workbooks.    Participation Level:  Active  Participation Quality:  Appropriate and Sharing  Affect:  Appropriate  Cognitive:  Appropriate  Insight: Appropriate  Engagement in Group:  Engaged and Limited  Modes of Intervention:  Socialization  Additional Comments:  The patient shared that she stayed in her room most of the day and did not participate much. The patient stated that her goal for tomorrow will be to participate more. The patient rated her day a 4/10. The patient did not participate in the group activity.   Kennieth Francois 03/21/2023, 9:54 PM

## 2023-03-21 NOTE — Plan of Care (Signed)
  Problem: Education: Goal: Knowledge of the prescribed therapeutic regimen will improve Outcome: Progressing   Problem: Activity: Goal: Imbalance in normal sleep/wake cycle will improve Outcome: Progressing   Problem: Coping: Goal: Coping ability will improve Outcome: Progressing   Problem: Health Behavior/Discharge Planning: Goal: Compliance with therapeutic regimen will improve Outcome: Progressing   Problem: Safety: Goal: Ability to disclose and discuss suicidal ideas will improve Outcome: Progressing

## 2023-03-21 NOTE — Progress Notes (Signed)
Patient denied SI/HI and AVH on assessment. Medication compliant, pt declined to attend group . Minimal interaction observed with peers. Pt encouraged to be out in milieu. Pt tolerated dinner in the Cafeteria. Safety maintained.  03/21/23 0815  Psych Admission Type (Psych Patients Only)  Admission Status Involuntary  Psychosocial Assessment  Patient Complaints Depression;Anxiety  Eye Contact Fair  Facial Expression Animated;Sad  Affect Appropriate to circumstance  Speech Logical/coherent  Interaction Childlike  Motor Activity Slow  Appearance/Hygiene Disheveled  Behavior Characteristics Appropriate to situation  Mood Depressed;Sad;Anxious  Thought Process  Coherency WDL  Content WDL  Delusions None reported or observed  Perception WDL  Hallucination None reported or observed  Judgment Poor  Confusion None  Danger to Self  Current suicidal ideation? Denies  Agreement Not to Harm Self Yes  Description of Agreement Verbal  Danger to Others  Danger to Others None reported or observed

## 2023-03-21 NOTE — Progress Notes (Signed)
[facility] MD Progress Note  03/21/2023 1:50 PM Yolanda [name]  MRN:  [id number]  Principal Problem: Schizoaffective disorder, depressive type (HCC)  Diagnosis: Principal Problem:   Schizoaffective disorder, depressive type (HCC) Active Problems:   GERD (gastroesophageal reflux disease)   Intellectual disability   Tobacco use disorder  Reason for admission: This is the first psychiatric admission in this [facility] in 12 years for this 47 AA female with an extensive hx of mental illnesses & probable polysubstance use disorders. She is admitted to the [facility] from the [facility] hospital with complain of worsening suicidal ideations with plan to stab herself. Per chart review, patient apparently reported at the ED that she has been depressed for a while & has not been taking her mental health medications. After medical evaluation.clearance, she was transferred to the [facility] for further psychiatric evaluation/treatments.   24 hr chart review: Sleep Hours last night: Sleep hrs not documented, but pt states that she slept well last night. Nursing Concerns: None noted. Behavioral episodes in the past 24 hrs: none reported.  Medication Compliance: Compliant  Vital Signs in the past 24 hrs: Low BP at 89/55 earlier today morning, and nursing asked to recheck. Pt is asymptomatic. Fluids encouraged. PRN Medications in the past 24 hrs: Tylenol, Miralax, Imitrex, Miralax  Patient assessment note:  On assessment today, the pt reports that her mood is still depressed, but she states that she is hoping that it will be uplifted if she hears something positive from DSS worker today. She is looking forward to having a meeting with the DSS worker and her assigned CSW today. As per CSW, the DSS worker is running late for the meeting as she was handling another matter in court.  Reports that anxiety is still somewhat elevated,but all related to her still being hospitalized. Sleep is good. Appetite is fair, states that she is  not eating too well because she has exhausted the food in the hospital and there is nothing new here. She reports wanting outside food at this point. Concentration is fair   Energy level is fair Denies suicidal thoughts. Denies suicidal intent and plan.  Denies having any HI.  Denies having psychotic symptoms. Denies AVH, denies paranoia and denies all first rank symptoms. Reports that she is continuing to have  migraines, but that the Imitrex is helpful.  Denies having side effects to current psychiatric medications.  We discussed keeping current medications same with no changes today.  Discussed the following psychosocial stressors: Continuous hospitalization due to lack of safe housing outside of the hospital. Pt verbalizes understanding that we are still awaiting placement by DSS in coordination with CSW so that we can place her in an environment outside the hospital that is safe for her.  She was applauded for her patient while we continue the process of locating placement for her. As per CSW, DSS had advised that a petitioned for guardianship would be filed by DSS over patient since she has been deemed incapable of taking care of self independently in the community. DSS to continue to keep treatment team updated regarding the outcomes of meetings regarding placement and guardianship. Pt is continuing to require a safe place to reside while the out of hospital placement is being sought. She has no family members who can take care of her at this time, and is unable to protect herself in a shelter type environment, and therefore hospitalization will continue while placement for her in the community is being sought. We will revisit  placement on a daily basis.   Total Time spent with patient:  35 minutes  Past Psychiatric History:  See H & P  Past Medical History:  Past Medical History:  Diagnosis Date   Bipolar affect, depressed (HCC)    Constipation 08/17/2022   Depression    Falls  07/21/2022   Fracture of femoral neck, right, closed (HCC) 01/17/2022   Herpes simplex 08/22/2017   Open fracture dislocation of right elbow joint 01/17/2022    Past Surgical History:  Procedure Laterality Date   NO PAST SURGERIES     SALPINGECTOMY     Family History: History reviewed. No pertinent family history.  Family Psychiatric  History: See H & P  Social History:  Social History   Substance and Sexual Activity  Alcohol Use Yes     Social History   Substance and Sexual Activity  Drug Use Yes   Types: Cocaine, Marijuana    Social History   Socioeconomic History   Marital status: Single    Spouse name: Not on file   Number of children: Not on file   Years of education: Not on file   Highest education level: Not on file  Occupational History   Not on file  Tobacco Use   Smoking status: Every Day   Smokeless tobacco: Not on file  Substance and Sexual Activity   Alcohol use: Yes   Drug use: Yes    Types: Cocaine, Marijuana   Sexual activity: Yes  Other Topics Concern   Not on file  Social History Narrative   Not on file   Social Determinants of Health   Financial Resource Strain: Low Risk  (09/12/2022)   Received from [facility], [facility]   Overall Financial Resource Strain (CARDIA)    Difficulty of Paying Living Expenses: Not hard at all  Food Insecurity: Patient Declined (12/20/2022)   Hunger Vital Sign    Worried About Running Out of Food in the Last Year: Patient declined    Ran Out of Food in the Last Year: Patient declined  Transportation Needs: No Transportation Needs (12/20/2022)   PRAPARE - Administrator, Civil Service (Medical): No    Lack of Transportation (Non-Medical): No  Physical Activity: Not on file  Stress: No Stress Concern Present (07/17/2022)   Received from [facility], [facility] - Occupational Stress Questionnaire    Feeling of Stress : Not at all  Social  Connections: Unknown (07/16/2022)   Received from [facility], [facility]   Social Network    Social Network: Not on file   Sleep: Good  Appetite:  Good  Current Medications: Current Facility-Administered Medications  Medication Dose Route Frequency Provider Last Rate Last Admin   acetaminophen (TYLENOL) tablet 650 mg  650 mg Oral Q6H PRN [name], NP   650 mg at 03/21/23 1143   alum & mag hydroxide-simeth (MAALOX/MYLANTA) 200-200-20 MG/5ML suspension 30 mL  30 mL Oral Q4H PRN [name], NP   30 mL at 01/28/23 1530   busPIRone (BUSPAR) tablet 15 mg  15 mg Oral BID [name], NP   15 mg at 03/21/23 0815   cyanocobalamin (VITAMIN B12) injection 1,000 mcg  1,000 mcg Intramuscular Q30 days Sarita Bottom, MD   1,000 mcg at 03/04/23 1421   diphenhydrAMINE (BENADRYL) capsule 50 mg  50 mg Oral TID PRN [name], NP   50 mg at 01/15/23 1211   Or   diphenhydrAMINE (  BENADRYL) injection 50 mg  50 mg Intramuscular TID PRN [name], NP       feeding supplement (ENSURE ENLIVE / ENSURE PLUS) liquid 237 mL  237 mL Oral TID BM Rayder Sullenger, NP   237 mL at 03/21/23 1007   haloperidol (HALDOL) tablet 5 mg  5 mg Oral TID PRN [name], NP   5 mg at 01/15/23 1211   Or   haloperidol lactate (HALDOL) injection 5 mg  5 mg Intramuscular TID PRN [name], NP       hydrOXYzine (ATARAX) tablet 25 mg  25 mg Oral TID PRN [name], DO   25 mg at 03/17/23 2106   LORazepam (ATIVAN) tablet 2 mg  2 mg Oral TID PRN [name], NP   2 mg at 01/15/23 1211   Or   LORazepam (ATIVAN) injection 2 mg  2 mg Intramuscular TID PRN [name], NP       magnesium hydroxide (MILK OF MAGNESIA) suspension 30 mL  30 mL Oral Daily PRN [name], NP   30 mL at 02/26/23 1828   melatonin tablet 5 mg  5 mg Oral QHS Klarisa Barman, NP   5 mg at 03/20/23 2109   nicotine (NICODERM CQ - dosed in mg/24 hours) patch 21 mg  21 mg Transdermal Daily [name], DO   21 mg at 03/06/23 2952    nicotine polacrilex (NICORETTE) gum 2 mg  2 mg Oral PRN [name], MD   2 mg at 03/20/23 1605   paliperidone (INVEGA) 24 hr tablet 6 mg  6 mg Oral Daily [name], NP   6 mg at 03/20/23 2108   pantoprazole (PROTONIX) EC tablet 40 mg  40 mg Oral Daily [name], NP   40 mg at 03/21/23 0828   polyethylene glycol (MIRALAX / GLYCOLAX) packet 17 g  17 g Oral Daily PRN [name], NP   17 g at 03/20/23 1744   sertraline (ZOLOFT) tablet 150 mg  150 mg Oral Daily Massengill, Harrold Donath, MD   150 mg at 03/21/23 8413   SUMAtriptan (IMITREX) tablet 25 mg  25 mg Oral BID PRN [name], NP   25 mg at 03/20/23 1603   traZODone (DESYREL) tablet 50 mg  50 mg Oral QHS [name], NP   50 mg at 03/20/23 2108   Vitamin D (Ergocalciferol) (DRISDOL) 1.25 MG (50000 UNIT) capsule 50,000 Units  50,000 Units Oral Q7 days [name], NP   50,000 Units at 03/16/23 1621   Lab Results:  No results found for this or any previous visit (from the past 48 hour(s)).  Blood Alcohol level:  Lab Results  Component Value Date   ETH <10 12/19/2022   ETH <10 11/21/2019   Metabolic Disorder Labs: Lab Results  Component Value Date   HGBA1C 4.4 (L) 12/21/2022   MPG 79.58 12/21/2022   No results found for: "PROLACTIN" Lab Results  Component Value Date   CHOL 218 (H) 12/21/2022   TRIG 81 12/21/2022   HDL 53 12/21/2022   CHOLHDL 4.1 12/21/2022   VLDL 16 12/21/2022   LDLCALC 149 (H) 12/21/2022   LDLCALC 110 (H) 03/13/2011   Physical Findings: AIMS: Facial and Oral Movements Muscles of Facial Expression: None, normal Lips and Perioral Area: None, normal Jaw: None, normal Tongue: None, normal,Extremity Movements Upper (arms, wrists, hands, fingers): None, normal Lower (legs, knees, ankles, toes): None, normal, Trunk Movements Neck, shoulders, hips: None, normal, Overall Severity Severity of abnormal movements (  highest score from questions above): None, normal Incapacitation due to abnormal  movements: None, normal Patient's awareness of abnormal movements (rate only patient's report): No Awareness, Dental Status Current problems with teeth and/or dentures?: No Does patient usually wear dentures?: No  CIWA:    COWS:    AIMS:0 Musculoskeletal: Strength & Muscle Tone: within normal limits Gait & Station: normal Patient leans: N/A  Psychiatric Specialty Exam:  Presentation  General Appearance:  Casual  Eye Contact: Limited to baseline Speech: Decreased amount, decreased tone and volume but at baseline Speech Volume: Normal  Handedness: Right  Mood and Affect  Mood: Sad and depressed mainly secondary to being in the hospital with no place to go Affect: Congruent  Thought Process  Thought Processes: Coherent  Descriptions of Associations:Intact  Orientation:Partial  Thought Content:Logical Concrete History of Schizophrenia/Schizoaffective disorder:Yes  Duration of Psychotic Symptoms:Greater than six months  Hallucinations:Hallucinations: None  Denies AVH, does not appear responding to stimuli  Ideas of Reference: None  Suicidal Thoughts:Suicidal Thoughts: No  Denies passive or active SI intention or plan   Homicidal Thoughts:Homicidal Thoughts: No  Denies HI  Sensorium  Memory: Immediate Good  Judgment: Fair  Insight: Fair  Art therapist  Concentration: Fair  Attention Span: Fair  Recall: Poor  Fund of Knowledge: Poor  Language: Fair  Psychomotor Activity  Psychomotor Activity: Psychomotor Activity: Normal    Assets  Assets: Resilience  Sleep  Sleep: Sleep: Good    Physical Exam: Physical Exam Vitals and nursing note reviewed.  Genitourinary:    Comments: Deferred Neurological:     General: No focal deficit present.     Mental Status: She is oriented to person, place, and time.  Psychiatric:        Mood and Affect: Mood normal.        Behavior: Behavior normal.        Thought Content:  Thought content normal.    Review of Systems  Constitutional:  Negative for fever.  HENT:  Negative for hearing loss.   Eyes:  Negative for blurred vision.  Respiratory:  Negative for cough.   Cardiovascular:  Negative for chest pain.  Gastrointestinal:  Negative for heartburn.  Genitourinary:  Negative for dysuria.  Musculoskeletal:  Negative for myalgias.  Skin:  Negative for rash.  Neurological:  Negative for dizziness.  Psychiatric/Behavioral:  Positive for depression. Negative for hallucinations, memory loss, substance abuse and suicidal ideas. The patient is nervous/anxious and has insomnia.   All other systems reviewed and are negative.  Blood pressure 105/76, pulse 66, temperature 97.9 F (36.6 C), temperature source Oral, resp. rate 18, height 4\' 11"  (1.499 m), weight 63 kg, SpO2 98%. Body mass index is 28.05 kg/m.  Treatment Plan Summary: Daily contact with patient to assess and evaluate symptoms and progress in treatment and Medication management.    Still waiting on safe disposition as patient would not be able to live independently given cognitive impairment. Recommend assisted living.    No medication side effects reported.  Because of urinary incontinence, recommended adult diapers to be worn.   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  Plan:  -Continue Imitrex 25 mg PRN BID for migraines -Continue Colace 100 mg daily for constipation -Continue Vitamin D 50.000 units weekly for bone health. -Continue Sertraline150 mg po Q daily for depression/anxiety -Continue Buspar 15 mg po bid for anxiety.  -  Continue paliperidone 6 mg po qd for mood stabilization -Continue Trazodone 50 mg nightly for insomnia  -Continue Nicoderm 21 mg  topically Q 24 hrs for nicotine withdrawal management. -Continue vitamin B12 1000 mcg IM weekly for 4 weeks then monthly -Continue Melatonin 5 mg nightly for sleep -Continue Protonix EC 40 mg p.o. daily for GERD -Continue MiraLAX 17 g p.o. PRN for constipation -Continue hydroxyzine 25 mg p.o. 3 times daily as needed for anxiety -Continue Ensure nutritional shakes TID in between meals  -Previously discontinued Hydroxyzine 50 mg nightly-hypotension in the mornings   Safety and Monitoring: Voluntary admission to inpatient psychiatric unit for safety, stabilization and treatment Daily contact with patient to assess and evaluate symptoms and progress in treatment Patient's case to be discussed in multi-disciplinary team meeting Observation Level : q15 minute checks Vital signs: q12 hours Precautions: Safety   Discharge Planning: Social work and case management to assist with discharge planning and identification of hospital follow-up needs prior to discharge Estimated LOS: Unknown at this time. Discharge Concerns: Need to establish a safety plan; Medication compliance and effectiveness Discharge Goals: Return home with outpatient referrals for mental health follow-up including medication management/psychotherapy No legal guardian, has payee  [name], NP,  03/21/2023, 1:50 PM Patient ID: Yolanda [name], female   DOB: 09-28-1975, 47 y.o.   MRN: [id number]

## 2023-03-21 NOTE — Progress Notes (Signed)
Pt had a nose bleed in her right nare. When she reported it to nursing staff, bleeding had stopped and dried blood was on the outside of her nare. She was educated to clean the exterior of her nose but do not forcefully blow, or pick at nose. Pt verbalized understanding.

## 2023-03-21 NOTE — Group Note (Signed)
LCSW Group Therapy Note   Group Date: 03/21/2023 Start Time: 1100 End Time: 1200  LCSW Group Therapy Type of Therapy and Topic:  Group Therapy:  Setting Goals   Participation Level:  Active   Description of Group: In this process group, patients discussed using strengths to work toward goals and address challenges.  Patients identified two positive things about themselves and one goal they were working on.  Patients were given the opportunity to share openly and support each other's plan for self-empowerment.  The group discussed the value of gratitude and were encouraged to have a daily reflection of positive characteristics or circumstances.  Patients were encouraged to identify a plan to utilize their strengths to work on current challenges and goals.   Therapeutic Goals Patient will verbalize personal strengths/positive qualities and relate how these can assist with achieving desired personal goals Patients will verbalize affirmation of peers plans for personal change and goal setting Patients will explore the value of gratitude and positive focus as related to successful achievement of goals Patients will verbalize a plan for regular reinforcement of personal positive qualities and circumstances.   Summary of Patient Progress: Pt was active in group.        Therapeutic Modalities Cognitive Behavioral Therapy Motivational Interviewing      Izell [place], Theresia Majors 03/21/2023  2:04 PM

## 2023-03-22 ENCOUNTER — Encounter (HOSPITAL_COMMUNITY): Payer: Self-pay

## 2023-03-22 DIAGNOSIS — F251 Schizoaffective disorder, depressive type: Secondary | ICD-10-CM | POA: Diagnosis not present

## 2023-03-22 NOTE — Progress Notes (Signed)
[facility] MD Progress Note  03/22/2023 5:00 PM Yolanda [name]  MRN:  [id number]  Principal Problem: Schizoaffective disorder, depressive type (HCC)  Diagnosis: Principal Problem:   Schizoaffective disorder, depressive type (HCC) Active Problems:   GERD (gastroesophageal reflux disease)   Intellectual disability   Tobacco use disorder  Reason for admission: This is the first psychiatric admission in this [facility] in 12 years for this 89 AA female with an extensive hx of mental illnesses & probable polysubstance use disorders. She is admitted to the [facility] from the [facility] hospital with complain of worsening suicidal ideations with plan to stab herself. Per chart review, patient apparently reported at the ED that she has been depressed for a while & has not been taking her mental health medications. After medical evaluation.clearance, she was transferred to the [facility] for further psychiatric evaluation/treatments.   24 hr chart review: Sleep Hours last night: Fair as per patient & good as per nsg reports Nursing Concerns: None noted. Behavioral episodes in the past 24 hrs: none reported.  Medication Compliance: Compliant  Vital Signs in the past 24 hrs: WNL PRN Medications in the past 24 hrs: None  Patient assessment note:  On assessment today, patient presented with a depressed mood, but was able to brighten up when told about her meeting with DSS. She got up, got herself showered and prepared for the virtual meeting. Please notes from CSW from today regarding meeting. No progress yet regarding placement in a safe environment outside of the hospital. Reports that anxiety is moderate, but all related to continuous hospitalization. Sleep is fair as per patient. Appetite is fair, states that she is not eating too well because she no longer likes the food choices being offered to her at this hospital. Concentration is fair   Energy level is fair Denies suicidal thoughts. Denies suicidal intent and  plan.  Denies having any HI.  Denies having psychotic symptoms. Denies AVH, denies paranoia and denies all first rank symptoms. Reports that she is continuing to have  migraines, but that the Imitrex is helpful. Continuing all meds as listed below with no changes today.  Denies having side effects to current psychiatric medications.  We discussed keeping current medications same with no changes today.  Discussed the following psychosocial stressors: Continuous hospitalization remains necessary at this time as pt has been deemed incapable of taking care of herself in a shelter type environment, and has no family who is willing to house her at this time.  Pt verbalizes understanding that we are still awaiting placement by DSS in coordination with CSW so that we can place her in an environment outside the hospital that is safe for her.    Total Time spent with patient:  35 minutes  Past Psychiatric History:  See H & P  Past Medical History:  Past Medical History:  Diagnosis Date   Bipolar affect, depressed (HCC)    Constipation 08/17/2022   Depression    Falls 07/21/2022   Fracture of femoral neck, right, closed (HCC) 01/17/2022   Herpes simplex 08/22/2017   Open fracture dislocation of right elbow joint 01/17/2022    Past Surgical History:  Procedure Laterality Date   NO PAST SURGERIES     SALPINGECTOMY     Family History: History reviewed. No pertinent family history.  Family Psychiatric  History: See H & P  Social History:  Social History   Substance and Sexual Activity  Alcohol Use Yes     Social History  Substance and Sexual Activity  Drug Use Yes   Types: Cocaine, Marijuana    Social History   Socioeconomic History   Marital status: [name]    Spouse name: Not on file   Number of children: Not on file   Years of education: Not on file   Highest education level: Not on file  Occupational History   Not on file  Tobacco Use   Smoking status: Every Day    Smokeless tobacco: Not on file  Substance and Sexual Activity   Alcohol use: Yes   Drug use: Yes    Types: Cocaine, Marijuana   Sexual activity: Yes  Other Topics Concern   Not on file  Social History Narrative   Not on file   Social Determinants of Health   Financial Resource Strain: Low Risk  (09/12/2022)   Received from [facility], [facility]   Overall Financial Resource Strain (CARDIA)    Difficulty of Paying Living Expenses: Not hard at all  Food Insecurity: Patient Declined (12/20/2022)   Hunger Vital Sign    Worried About Running Out of Food in the Last Year: Patient declined    Ran Out of Food in the Last Year: Patient declined  Transportation Needs: No Transportation Needs (12/20/2022)   PRAPARE - Administrator, Civil Service (Medical): No    Lack of Transportation (Non-Medical): No  Physical Activity: Not on file  Stress: No Stress Concern Present (07/17/2022)   Received from [facility], [facility] Occupational [facility] - Occupational Stress Questionnaire    Feeling of Stress : Not at all  Social Connections: Unknown (07/16/2022)   Received from [facility], [facility]   Social Network    Social Network: Not on file   Sleep: Good  Appetite:  Good  Current Medications: Current Facility-Administered Medications  Medication Dose Route Frequency Provider Last Rate Last Admin   acetaminophen (TYLENOL) tablet 650 mg  650 mg Oral Q6H PRN [name], NP   650 mg at 03/21/23 1143   alum & mag hydroxide-simeth (MAALOX/MYLANTA) 200-200-20 MG/5ML suspension 30 mL  30 mL Oral Q4H PRN [name], NP   30 mL at 01/28/23 1530   busPIRone (BUSPAR) tablet 15 mg  15 mg Oral BID [name], NP   15 mg at 03/22/23 0754   cyanocobalamin (VITAMIN B12) injection 1,000 mcg  1,000 mcg Intramuscular Q30 days [name], Nadir, MD   1,000 mcg at 03/04/23 1421   diphenhydrAMINE (BENADRYL) capsule 50 mg  50 mg Oral TID PRN [name], NP    50 mg at 01/15/23 1211   Or   diphenhydrAMINE (BENADRYL) injection 50 mg  50 mg Intramuscular TID PRN [name], NP       feeding supplement (ENSURE ENLIVE / ENSURE PLUS) liquid 237 mL  237 mL Oral TID BM Almyra Birman, NP   237 mL at 03/22/23 1510   haloperidol (HALDOL) tablet 5 mg  5 mg Oral TID PRN [name], NP   5 mg at 01/15/23 1211   Or   haloperidol lactate (HALDOL) injection 5 mg  5 mg Intramuscular TID PRN [name], NP       hydrOXYzine (ATARAX) tablet 25 mg  25 mg Oral TID PRN [name], DO   25 mg at 03/17/23 2106   LORazepam (ATIVAN) tablet 2 mg  2 mg Oral TID PRN [name], NP   2 mg at 01/15/23 1211   Or  LORazepam (ATIVAN) injection 2 mg  2 mg Intramuscular TID PRN [name], NP       magnesium hydroxide (MILK OF MAGNESIA) suspension 30 mL  30 mL Oral Daily PRN [name], NP   30 mL at 02/26/23 1828   melatonin tablet 5 mg  5 mg Oral QHS Velton Roselle, NP   5 mg at 03/21/23 2055   nicotine (NICODERM CQ - dosed in mg/24 hours) patch 21 mg  21 mg Transdermal Daily [name], DO   21 mg at 03/06/23 8295   nicotine polacrilex (NICORETTE) gum 2 mg  2 mg Oral PRN [name], MD   2 mg at 03/20/23 1605   paliperidone (INVEGA) 24 hr tablet 6 mg  6 mg Oral Daily [name], NP   6 mg at 03/21/23 2055   pantoprazole (PROTONIX) EC tablet 40 mg  40 mg Oral Daily [name], NP   40 mg at 03/22/23 0754   polyethylene glycol (MIRALAX / GLYCOLAX) packet 17 g  17 g Oral Daily PRN [name], NP   17 g at 03/20/23 1744   sertraline (ZOLOFT) tablet 150 mg  150 mg Oral Daily Massengill, Harrold Donath, MD   150 mg at 03/22/23 0754   SUMAtriptan (IMITREX) tablet 25 mg  25 mg Oral BID PRN [name], NP   25 mg at 03/20/23 1603   traZODone (DESYREL) tablet 50 mg  50 mg Oral QHS [name], NP   50 mg at 03/21/23 2055   Vitamin D (Ergocalciferol) (DRISDOL) 1.25 MG (50000 UNIT) capsule 50,000 Units  50,000 Units Oral Q7 days [name], NP    50,000 Units at 03/16/23 1621   Lab Results:  No results found for this or any previous visit (from the past 48 hour(s)).  Blood Alcohol level:  Lab Results  Component Value Date   ETH <10 12/19/2022   ETH <10 11/21/2019   Metabolic Disorder Labs: Lab Results  Component Value Date   HGBA1C 4.4 (L) 12/21/2022   MPG 79.58 12/21/2022   No results found for: "PROLACTIN" Lab Results  Component Value Date   CHOL 218 (H) 12/21/2022   TRIG 81 12/21/2022   HDL 53 12/21/2022   CHOLHDL 4.1 12/21/2022   VLDL 16 12/21/2022   LDLCALC 149 (H) 12/21/2022   LDLCALC 110 (H) 03/13/2011   Physical Findings: AIMS: Facial and Oral Movements Muscles of Facial Expression: None, normal Lips and Perioral Area: None, normal Jaw: None, normal Tongue: None, normal,Extremity Movements Upper (arms, wrists, hands, fingers): None, normal Lower (legs, knees, ankles, toes): None, normal, Trunk Movements Neck, shoulders, hips: None, normal, Overall Severity Severity of abnormal movements (highest score from questions above): None, normal Incapacitation due to abnormal movements: None, normal Patient's awareness of abnormal movements (rate only patient's report): No Awareness, Dental Status Current problems with teeth and/or dentures?: No Does patient usually wear dentures?: No  CIWA:    COWS:    AIMS:0 Musculoskeletal: Strength & Muscle Tone: within normal limits Gait & Station: normal Patient leans: N/A  Psychiatric Specialty Exam:  Presentation  General Appearance:  Disheveled  Eye Contact: Limited to baseline Speech: Decreased amount, decreased tone and volume but at baseline Speech Volume: Decreased  Handedness: Right  Mood and Affect  Mood: Sad and depressed mainly secondary to being in the hospital with no place to go Affect: Congruent  Thought Process  Thought Processes: Coherent  Descriptions of Associations:Intact  Orientation:Partial  Thought  Content:Logical Concrete History of Schizophrenia/Schizoaffective disorder:Yes  Duration  of Psychotic Symptoms:Greater than six months  Hallucinations:Hallucinations: None  Denies AVH, does not appear responding to stimuli  Ideas of Reference: None  Suicidal Thoughts:Suicidal Thoughts: No  Denies passive or active SI intention or plan   Homicidal Thoughts:Homicidal Thoughts: No  Denies HI  Sensorium  Memory: Immediate Good  Judgment: Fair  Insight: Fair  Chartered certified accountant: Fair  Attention Span: Fair  Recall: [employer] of Knowledge: Fair  Language: Fair  Psychomotor Activity  Psychomotor Activity: Psychomotor Activity: Normal    Assets  Assets: Housing  Sleep  Sleep: Sleep: Good    Physical Exam: Physical Exam Vitals and nursing note reviewed.  Genitourinary:    Comments: Deferred Neurological:     General: No focal deficit present.     Mental Status: She is oriented to person, place, and time.  Psychiatric:        Mood and Affect: Mood normal.        Behavior: Behavior normal.        Thought Content: Thought content normal.    Review of Systems  Constitutional:  Negative for fever.  HENT:  Negative for hearing loss.   Eyes:  Negative for blurred vision.  Respiratory:  Negative for cough.   Cardiovascular:  Negative for chest pain.  Gastrointestinal:  Negative for heartburn.  Genitourinary:  Negative for dysuria.  Musculoskeletal:  Negative for myalgias.  Skin:  Negative for rash.  Neurological:  Negative for dizziness.  Psychiatric/Behavioral:  Positive for depression. Negative for hallucinations, memory loss, substance abuse and suicidal ideas. The patient is nervous/anxious and has insomnia.   All other systems reviewed and are negative.  Blood pressure 106/75, pulse 74, temperature 97.9 F (36.6 C), temperature source Oral, resp. rate 18, height 4\' 11"  (1.499 m), weight 63 kg, SpO2 98%. Body mass index is  28.05 kg/m.  Treatment Plan Summary: Daily contact with patient to assess and evaluate symptoms and progress in treatment and Medication management.    Still waiting on safe disposition as patient would not be able to live independently given cognitive impairment. Recommend assisted living.    No medication side effects reported.  Because of urinary incontinence, recommended adult diapers to be worn.   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  Plan:  -Continue Imitrex 25 mg PRN BID for migraines -Continue Colace 100 mg daily for constipation -Continue Vitamin D 50.000 units weekly for bone health. -Continue Sertraline150 mg po Q daily for depression/anxiety -Continue Buspar 15 mg po bid for anxiety.  -Continue paliperidone 6 mg po qd for mood stabilization -Continue Trazodone 50 mg nightly for insomnia  -Continue Nicoderm 21 mg topically Q 24 hrs for nicotine withdrawal management. -Continue vitamin B12 1000 mcg IM weekly for 4 weeks then monthly -Continue Melatonin 5 mg nightly for sleep -Continue Protonix EC 40 mg p.o. daily for GERD -Continue MiraLAX 17 g p.o. PRN for constipation -Continue hydroxyzine 25 mg p.o. 3 times daily as needed for anxiety -Continue Ensure nutritional shakes TID in between meals  -Previously discontinued Hydroxyzine 50 mg nightly-hypotension in the mornings   Safety and Monitoring: Voluntary admission to inpatient psychiatric unit for safety, stabilization and treatment Daily contact with patient to assess and evaluate symptoms and progress in treatment Patient's case to be discussed in multi-disciplinary team meeting Observation Level : q15 minute checks Vital signs: q12 hours Precautions:  Safety   Discharge  Planning: Social work and case management to assist with discharge planning and identification of hospital follow-up needs prior to discharge Estimated LOS: Unknown at this time. Discharge Concerns: Need to establish a safety plan; Medication compliance and effectiveness Discharge Goals: Return home with outpatient referrals for mental health follow-up including medication management/psychotherapy No legal guardian, has payee  [name], NP,  03/22/2023, 5:00 PM Patient ID: Yolanda [name], female   DOB: [birth year], 47 y.o.   MRN: [id number]

## 2023-03-22 NOTE — Progress Notes (Signed)
03/21/23 0125  Psych Admission Type (Psych Patients Only)  Admission Status Involuntary  Psychosocial Assessment  Patient Complaints Depression  Eye Contact Fair  Facial Expression Animated  Affect Appropriate to circumstance  Speech Logical/coherent  Interaction Childlike  Motor Activity Slow  Appearance/Hygiene Disheveled  Behavior Characteristics Appropriate to situation  Mood Depressed;Sad  Thought Process  Coherency WDL  Content WDL  Delusions None reported or observed  Perception WDL  Hallucination None reported or observed  Judgment Poor  Confusion None  Danger to Self  Current suicidal ideation? Denies  Self-Injurious Behavior No self-injurious ideation or behavior indicators observed or expressed   Agreement Not to Harm Self Yes  Description of Agreement verbal  Danger to Others  Danger to Others None reported or observed

## 2023-03-22 NOTE — Progress Notes (Signed)
CSW met with Department of Social Services Social Worker Ms. Thamas Jaegers, Case Managers, Corey Skains and Belva Chimes, virtually to discuss progress concerning [name] group home placement. Pt was present during the discussion and provided input concerning her needs. Ms. Tyler Aas, NP also joined the meeting and provided support for the patient. CSW provided signed copy of FL2 and inquired about the status of guardianship. Ms. [name] expressed that she had not filed for Guardianship and states that she was awaiting potential family members of the pt, to express a desire to become the pt's guardian. Ms. [name] explained that she does intend to file an Ex parte' petition in order to act as the pt's guardian in lieu of awaiting a family member's consent. CSW was notified of pt's Medicaid changing from [facility] to [facility], CSW requested new Medicaid number. Ms. Vicente Serene shared that she and her colleague Ms. Elenore Paddy are currently trying to locate an appropriate placement but have been unsuccessful. Several attempts have been made to  contact pt's Mother [facility]) have been unsuccessful. CSW will continue to monitor.

## 2023-03-22 NOTE — Plan of Care (Signed)
  Problem: Activity: Goal: Interest or engagement in leisure activities will improve Outcome: Progressing   Problem: Coping: Goal: Coping ability will improve Outcome: Progressing   Problem: Health Behavior/Discharge Planning: Goal: Compliance with therapeutic regimen will improve Outcome: Progressing   Problem: Safety: Goal: Ability to disclose and discuss suicidal ideas will improve Outcome: Progressing   Problem: Medication: Goal: Compliance with prescribed medication regimen will improve Outcome: Progressing   Problem: Physical Regulation: Goal: Ability to maintain clinical measurements within normal limits will improve Outcome: Progressing

## 2023-03-22 NOTE — BH IP Treatment Plan (Signed)
Interdisciplinary Treatment and Diagnostic Plan Update  03/22/2023 Time of Session: 9:35 AM (UPDATE)  Yolanda [name] MRN: [id number]  Principal Diagnosis: Schizoaffective disorder, depressive type (HCC)  Secondary Diagnoses: Principal Problem:   Schizoaffective disorder, depressive type (HCC) Active Problems:   GERD (gastroesophageal reflux disease)   Intellectual disability   Tobacco use disorder   Current Medications:  Current Facility-Administered Medications  Medication Dose Route Frequency Provider Last Rate Last Admin   acetaminophen (TYLENOL) tablet 650 mg  650 mg Oral Q6H PRN [name], NP   650 mg at 03/21/23 1143   alum & mag hydroxide-simeth (MAALOX/MYLANTA) 200-200-20 MG/5ML suspension 30 mL  30 mL Oral Q4H PRN [name], NP   30 mL at 01/28/23 1530   busPIRone (BUSPAR) tablet 15 mg  15 mg Oral BID [name], NP   15 mg at 03/22/23 0754   cyanocobalamin (VITAMIN B12) injection 1,000 mcg  1,000 mcg Intramuscular Q30 days [name], Nadir, MD   1,000 mcg at 03/04/23 1421   diphenhydrAMINE (BENADRYL) capsule 50 mg  50 mg Oral TID PRN [name], NP   50 mg at 01/15/23 1211   Or   diphenhydrAMINE (BENADRYL) injection 50 mg  50 mg Intramuscular TID PRN [name], NP       feeding supplement (ENSURE ENLIVE / ENSURE PLUS) liquid 237 mL  237 mL Oral TID BM Nkwenti, Doris, NP   237 mL at 03/21/23 2059   haloperidol (HALDOL) tablet 5 mg  5 mg Oral TID PRN [name], NP   5 mg at 01/15/23 1211   Or   haloperidol lactate (HALDOL) injection 5 mg  5 mg Intramuscular TID PRN [name], NP       hydrOXYzine (ATARAX) tablet 25 mg  25 mg Oral TID PRN [name], DO   25 mg at 03/17/23 2106   LORazepam (ATIVAN) tablet 2 mg  2 mg Oral TID PRN [name], NP   2 mg at 01/15/23 1211   Or   LORazepam (ATIVAN) injection 2 mg  2 mg Intramuscular TID PRN [name], NP       magnesium hydroxide (MILK OF MAGNESIA) suspension 30 mL  30 mL Oral Daily PRN  [name], NP   30 mL at 02/26/23 1828   melatonin tablet 5 mg  5 mg Oral QHS Nkwenti, Doris, NP   5 mg at 03/21/23 2055   nicotine (NICODERM CQ - dosed in mg/24 hours) patch 21 mg  21 mg Transdermal Daily [name], DO   21 mg at 03/06/23 5366   nicotine polacrilex (NICORETTE) gum 2 mg  2 mg Oral PRN [name], MD   2 mg at 03/20/23 1605   paliperidone (INVEGA) 24 hr tablet 6 mg  6 mg Oral Daily [name], NP   6 mg at 03/21/23 2055   pantoprazole (PROTONIX) EC tablet 40 mg  40 mg Oral Daily [name], NP   40 mg at 03/22/23 0754   polyethylene glycol (MIRALAX / GLYCOLAX) packet 17 g  17 g Oral Daily PRN [name], NP   17 g at 03/20/23 1744   sertraline (ZOLOFT) tablet 150 mg  150 mg Oral Daily Massengill, Harrold Donath, MD   150 mg at 03/22/23 0754   SUMAtriptan (IMITREX) tablet 25 mg  25 mg Oral BID PRN [name], NP   25 mg at 03/20/23 1603   traZODone (DESYREL) tablet 50 mg  50 mg Oral QHS [name], NP  50 mg at 03/21/23 2055   Vitamin D (Ergocalciferol) (DRISDOL) 1.25 MG (50000 UNIT) capsule 50,000 Units  50,000 Units Oral Q7 days [name], NP   50,000 Units at 03/16/23 1621   PTA Medications: Medications Prior to Admission  Medication Sig Dispense Refill Last Dose   busPIRone (BUSPAR) 15 MG tablet Take 15 mg by mouth 2 (two) times daily. (Patient not taking: Reported on 12/19/2022)      paliperidone (INVEGA SUSTENNA) 156 MG/ML SUSY injection Inject 156 mg into the muscle once. (Patient not taking: Reported on 12/19/2022)      sertraline (ZOLOFT) 50 MG tablet Take 150 mg by mouth daily. (Patient not taking: Reported on 12/19/2022)      traZODone (DESYREL) 100 MG tablet Take 100 mg by mouth at bedtime as needed for sleep. (Patient not taking: Reported on 11/12/2022)       Patient Stressors: Medication change or noncompliance    Patient Strengths: Forensic psychologist fund of knowledge   Treatment Modalities: Medication Management, Group  therapy, Case management,  1 to 1 session with clinician, Psychoeducation, Recreational therapy.   Physician Treatment Plan for Primary Diagnosis: Schizoaffective disorder, depressive type (HCC) Long Term Goal(s): Improvement in symptoms so as ready for discharge   Short Term Goals: Ability to identify and develop effective coping behaviors will improve Ability to maintain clinical measurements within normal limits will improve Compliance with prescribed medications will improve Ability to identify triggers associated with substance abuse/mental health issues will improve Ability to identify changes in lifestyle to reduce recurrence of condition will improve Ability to verbalize feelings will improve Ability to disclose and discuss suicidal ideas Ability to demonstrate self-control will improve  Medication Management: Evaluate patient's response, side effects, and tolerance of medication regimen.  Therapeutic Interventions: 1 to 1 sessions, Unit Group sessions and Medication administration.  Evaluation of Outcomes: Progressing  Physician Treatment Plan for Secondary Diagnosis: Principal Problem:   Schizoaffective disorder, depressive type (HCC) Active Problems:   GERD (gastroesophageal reflux disease)   Intellectual disability   Tobacco use disorder  Long Term Goal(s): Improvement in symptoms so as ready for discharge   Short Term Goals: Ability to identify and develop effective coping behaviors will improve Ability to maintain clinical measurements within normal limits will improve Compliance with prescribed medications will improve Ability to identify triggers associated with substance abuse/mental health issues will improve Ability to identify changes in lifestyle to reduce recurrence of condition will improve Ability to verbalize feelings will improve Ability to disclose and discuss suicidal ideas Ability to demonstrate self-control will improve     Medication Management:  Evaluate patient's response, side effects, and tolerance of medication regimen.  Therapeutic Interventions: 1 to 1 sessions, Unit Group sessions and Medication administration.  Evaluation of Outcomes: Progressing   RN Treatment Plan for Primary Diagnosis: Schizoaffective disorder, depressive type (HCC) Long Term Goal(s): Knowledge of disease and therapeutic regimen to maintain health will improve  Short Term Goals: Ability to remain free from injury will improve, Ability to verbalize frustration and anger appropriately will improve, Ability to participate in decision making will improve, Ability to verbalize feelings will improve, Ability to identify and develop effective coping behaviors will improve, and Compliance with prescribed medications will improve  Medication Management: RN will administer medications as ordered by provider, will assess and evaluate patient's response and provide education to patient for prescribed medication. RN will report any adverse and/or side effects to prescribing provider.  Therapeutic Interventions: 1 on 1 counseling sessions, Psychoeducation, Medication  administration, Evaluate responses to treatment, Monitor vital signs and CBGs as ordered, Perform/monitor CIWA, COWS, AIMS and Fall Risk screenings as ordered, Perform wound care treatments as ordered.  Evaluation of Outcomes: Progressing   LCSW Treatment Plan for Primary Diagnosis: Schizoaffective disorder, depressive type (HCC) Long Term Goal(s): Safe transition to appropriate next level of care at discharge, Engage patient in therapeutic group addressing interpersonal concerns.  Short Term Goals: Engage patient in aftercare planning with referrals and resources, Increase social support, Increase emotional regulation, Facilitate acceptance of mental health diagnosis and concerns, Identify triggers associated with mental health/substance abuse issues, and Increase skills for wellness and  recovery  Therapeutic Interventions: Assess for all discharge needs, 1 to 1 time with Social worker, Explore available resources and support systems, Assess for adequacy in community support network, Educate family and significant other(s) on suicide prevention, Complete Psychosocial Assessment, Interpersonal group therapy.  Evaluation of Outcomes: Progressing   Progress in Treatment:  Attending groups: Yes. Participating in groups: Yes. Taking medication as prescribed: Yes. Toleration medication: Yes. Family/Significant other contact made: Yes, individual(s) contacted:  DSS Patient understands diagnosis: Yes. Discussing patient identified problems/goals with staff: Yes. Medical problems stabilized or resolved: Yes. Denies suicidal/homicidal ideation: Yes. Issues/concerns per patient self-inventory: No   New problem(s) identified: No, Describe:  none reported   New Short Term/Long Term Goal(s): Guardianship/DSS   Patient Goals:  Group Home placement   Discharge Plan or Barriers: CSW will continue to follow and assess for appropriate referrals and possible discharge planning.    Reason for Continuation of Hospitalization: Anxiety Depression Medication stabilization Suicidal ideation   Estimated Length of Stay: Unknown still looking for placement , pt has a meeting now 9/13 with partners/DSS  Last 3 [place] Suicide Severity Risk Score: Flowsheet Row Admission (Current) [facility] from [facility]/19/2022 in [facility] at [facility] ED from 11/12/2022 in [facility] Emergency Department at [facility]  C-SSRS RISK CATEGORY High Risk High Risk Moderate Risk       Last [facility] 2/9 Scores:     No data to display          Scribe for Treatment Team: Beather Arbour 03/22/2023 9:44 AM

## 2023-03-22 NOTE — [facility] Group Notes (Signed)
[facility] Group Notes:  (Nursing/MHT/Case Management/Adjunct)  Date:  03/22/2023  Time:  9:23 PM  Type of Therapy:   Wrap-Up Group  Participation Level:  Active  Participation Quality:  Appropriate and Attentive  Affect:  Appropriate  Cognitive:  Appropriate and Oriented  Insight:  Improving  Engagement in Group:  Developing/Improving  Modes of Intervention:  Discussion and Socialization  Summary of Progress/Problems: Patient attended AA group. Patient engaged  in group appropriately.  Fowler Antos 03/22/2023, 9:23 PM

## 2023-03-22 NOTE — Group Note (Signed)
Recreation Therapy Group Note   Group Topic:Problem Solving  Group Date: 03/22/2023 Start Time: 0930 End Time: 1000 Facilitators: Sapphire Tygart-McCall, LRT,CTRS Location: [address] Dayroom   Group Topic: Communication, Team Building, Problem Solving   Goal Area(s) Addresses:  Patient will effectively work with peer towards shared goal.  Patient will identify skills used to make activity successful.  Patient will share challenges and verbalize solution-driven approaches used. Patient will identify how skills used during activity can be used to reach post d/c goals.    Group Description: [employer]. Patients were provided the following materials: 5 drinking straws, 5 rubber bands, 5 paper clips, 2 index cards, 2 drinking cups, and 2 toilet paper rolls. Using the provided materials patients were asked to build a launching mechanism to launch a ping pong ball across the room, approximately 10 feet. Patients were divided into teams of 3-5. Instructions required all materials be incorporated into the device, functionality of items left to the peer group's discretion.   Education: Pharmacist, community, Scientist, physiological, Air cabin crew, Building control surveyor.    Education Outcome: Acknowledges education/In group clarification offered/Needs additional education.    Affect/Mood: N/A   Participation Level: Did not attend    Clinical Observations/Individualized Feedback:     Plan: Continue to engage patient in RT group sessions 2-3x/week.   Loys Hoselton-McCall, LRT,CTRS 03/22/2023 11:28 AM

## 2023-03-22 NOTE — Progress Notes (Signed)
Patient out of bed in milieu for part of shift, able to eat lunch and dinner in the cafeteria. Medication compliant, pt declined to attend group therapy. Patient denied SI/HI and AVH. Safety maintained.  03/22/23 0825  Psych Admission Type (Psych Patients Only)  Admission Status Involuntary  Psychosocial Assessment  Patient Complaints Depression  Eye Contact Fair  Facial Expression Animated;Sad  Affect Appropriate to circumstance  Speech Logical/coherent  Interaction Childlike  Motor Activity Slow  Appearance/Hygiene Disheveled  Behavior Characteristics Appropriate to situation  Mood Depressed;Sad  Thought Process  Coherency WDL  Content WDL  Delusions None reported or observed  Perception WDL  Hallucination None reported or observed  Judgment Poor  Confusion None  Danger to Self  Current suicidal ideation? Denies  Agreement Not to Harm Self Yes  Description of Agreement Verbal  Danger to Others  Danger to Others None reported or observed

## 2023-03-23 DIAGNOSIS — F251 Schizoaffective disorder, depressive type: Secondary | ICD-10-CM | POA: Diagnosis not present

## 2023-03-23 NOTE — Plan of Care (Signed)
Problem: Education: Goal: Utilization of techniques to improve thought processes will improve Outcome: Progressing   Problem: Activity: Goal: Interest or engagement in leisure activities will improve Outcome: Progressing   Problem: Coping: Goal: Coping ability will improve Outcome: Progressing   Problem: Health Behavior/Discharge Planning: Goal: Ability to make decisions will improve Outcome: Progressing

## 2023-03-23 NOTE — [facility] Group Notes (Signed)
[facility] Group Notes:  (Nursing/MHT/Case Management/Adjunct)  Date:  03/23/2023  Time:  2000  Type of Therapy:   Wrap up group  Participation Level:  Active  Participation Quality:  Appropriate, Attentive, Sharing, and Supportive  Affect:  Appropriate  Cognitive:  Lacking  Insight:  Lacking  Engagement in Group:  Engaged  Modes of Intervention:  Clarification, Education, and Support  Summary of Progress/Problems: Positive thinking and positive change were discussed.   Yolanda [name] 03/23/2023, 8:43 PM

## 2023-03-23 NOTE — Progress Notes (Signed)
[facility] MD Progress Note  03/23/2023 1:54 PM Yolanda [name]  MRN:  [id number] Subjective:  Yolanda [name] was seen initially this morning but she did not feel like talking. She was seen again around noon. She did not go down for lunch. She states that she is "good" but affect is flat and she is difficult to engage. She has no new physical complaints. She denies hallucinations, thoughts of harm to self or others. No medication side effects.   Principal Problem: Schizoaffective disorder, depressive type (HCC) Diagnosis: Principal Problem:   Schizoaffective disorder, depressive type (HCC) Active Problems:   GERD (gastroesophageal reflux disease)   Intellectual disability   Tobacco use disorder  Total Time spent with patient: 15 minutes  Past Psychiatric History: see H&P  Past Medical History:  Past Medical History:  Diagnosis Date   Bipolar affect, depressed (HCC)    Constipation 08/17/2022   Depression    Falls 07/21/2022   Fracture of femoral neck, right, closed (HCC) 01/17/2022   Herpes simplex 08/22/2017   Open fracture dislocation of right elbow joint 01/17/2022    Past Surgical History:  Procedure Laterality Date   NO PAST SURGERIES     SALPINGECTOMY     Family History: History reviewed. No pertinent family history. Family Psychiatric  History: see H&P Social History:  Social History   Substance and Sexual Activity  Alcohol Use Yes     Social History   Substance and Sexual Activity  Drug Use Yes   Types: Cocaine, Marijuana    Social History   Socioeconomic History   Marital status: Single    Spouse name: Not on file   Number of children: Not on file   Years of education: Not on file   Highest education level: Not on file  Occupational History   Not on file  Tobacco Use   Smoking status: Every Day   Smokeless tobacco: Not on file  Substance and Sexual Activity   Alcohol use: Yes   Drug use: Yes    Types: Cocaine, Marijuana   Sexual activity: Yes  Other  Topics Concern   Not on file  Social History Narrative   Not on file   Social Determinants of Health   Financial Resource Strain: Low Risk  (09/12/2022)   Received from [facility], [facility]   Overall Financial Resource Strain (CARDIA)    Difficulty of Paying Living Expenses: Not hard at all  Food Insecurity: Patient Declined (12/20/2022)   Hunger Vital Sign    Worried About Running Out of Food in the Last Year: Patient declined    Ran Out of Food in the Last Year: Patient declined  Transportation Needs: No Transportation Needs (12/20/2022)   PRAPARE - Administrator, Civil Service (Medical): No    Lack of Transportation (Non-Medical): No  Physical Activity: Not on file  Stress: No Stress Concern Present (07/17/2022)   Received from [facility], [facility] of Occupational [facility] - Occupational Stress Questionnaire    Feeling of Stress : Not at all  Social Connections: Unknown (07/16/2022)   Received from [facility], [facility]   Social Network    Social Network: Not on file   Additional Social History:                         Sleep: Fair  Appetite:  Poor  Current Medications: Current Facility-Administered Medications  Medication Dose Route Frequency Provider Last Rate Last Admin  acetaminophen (TYLENOL) tablet 650 mg  650 mg Oral Q6H PRN [name], NP   650 mg at 03/22/23 1951   alum & mag hydroxide-simeth (MAALOX/MYLANTA) 200-200-20 MG/5ML suspension 30 mL  30 mL Oral Q4H PRN [name], NP   30 mL at 01/28/23 1530   busPIRone (BUSPAR) tablet 15 mg  15 mg Oral BID [name], NP   15 mg at 03/23/23 0932   cyanocobalamin (VITAMIN B12) injection 1,000 mcg  1,000 mcg Intramuscular Q30 days Sarita Bottom, MD   1,000 mcg at 03/04/23 1421   diphenhydrAMINE (BENADRYL) capsule 50 mg  50 mg Oral TID PRN [name], NP   50 mg at 01/15/23 1211   Or   diphenhydrAMINE (BENADRYL) injection 50 mg  50 mg Intramuscular  TID PRN [name], NP       feeding supplement (ENSURE ENLIVE / ENSURE PLUS) liquid 237 mL  237 mL Oral TID BM Nkwenti, Doris, NP   237 mL at 03/23/23 0934   haloperidol (HALDOL) tablet 5 mg  5 mg Oral TID PRN [name], NP   5 mg at 01/15/23 1211   Or   haloperidol lactate (HALDOL) injection 5 mg  5 mg Intramuscular TID PRN [name], NP       hydrOXYzine (ATARAX) tablet 25 mg  25 mg Oral TID PRN [name], DO   25 mg at 03/22/23 1951   LORazepam (ATIVAN) tablet 2 mg  2 mg Oral TID PRN [name], NP   2 mg at 01/15/23 1211   Or   LORazepam (ATIVAN) injection 2 mg  2 mg Intramuscular TID PRN [name], NP       magnesium hydroxide (MILK OF MAGNESIA) suspension 30 mL  30 mL Oral Daily PRN [name], NP   30 mL at 03/22/23 2112   melatonin tablet 5 mg  5 mg Oral QHS Nkwenti, Doris, NP   5 mg at 03/22/23 2108   nicotine (NICODERM CQ - dosed in mg/24 hours) patch 21 mg  21 mg Transdermal Daily [name], DO   21 mg at 03/06/23 1610   nicotine polacrilex (NICORETTE) gum 2 mg  2 mg Oral PRN [name], MD   2 mg at 03/22/23 1951   paliperidone (INVEGA) 24 hr tablet 6 mg  6 mg Oral Daily [name], NP   6 mg at 03/22/23 2114   pantoprazole (PROTONIX) EC tablet 40 mg  40 mg Oral Daily [name], NP   40 mg at 03/23/23 0932   polyethylene glycol (MIRALAX / GLYCOLAX) packet 17 g  17 g Oral Daily PRN [name], NP   17 g at 03/20/23 1744   sertraline (ZOLOFT) tablet 150 mg  150 mg Oral Daily Massengill, Harrold Donath, MD   150 mg at 03/23/23 0932   SUMAtriptan (IMITREX) tablet 25 mg  25 mg Oral BID PRN [name], NP   25 mg at 03/20/23 1603   traZODone (DESYREL) tablet 50 mg  50 mg Oral QHS [name], NP   50 mg at 03/22/23 2108   Vitamin D (Ergocalciferol) (DRISDOL) 1.25 MG (50000 UNIT) capsule 50,000 Units  50,000 Units Oral Q7 days [name], NP   50,000 Units at 03/16/23 1621    Lab Results: No results found for this or any previous visit  (from the past 48 hour(s)).  Blood Alcohol level:  Lab Results  Component Value Date   ETH <10 12/19/2022   ETH <10 11/21/2019  Metabolic Disorder Labs: Lab Results  Component Value Date   HGBA1C 4.4 (L) 12/21/2022   MPG 79.58 12/21/2022   No results found for: "PROLACTIN" Lab Results  Component Value Date   CHOL 218 (H) 12/21/2022   TRIG 81 12/21/2022   HDL 53 12/21/2022   CHOLHDL 4.1 12/21/2022   VLDL 16 12/21/2022   LDLCALC 149 (H) 12/21/2022   LDLCALC 110 (H) 03/13/2011    Physical Findings: AIMS: Facial and Oral Movements Muscles of Facial Expression: None, normal Lips and Perioral Area: None, normal Jaw: None, normal Tongue: None, normal,Extremity Movements Upper (arms, wrists, hands, fingers): None, normal Lower (legs, knees, ankles, toes): None, normal, Trunk Movements Neck, shoulders, hips: None, normal, Overall Severity Severity of abnormal movements (highest score from questions above): None, normal Incapacitation due to abnormal movements: None, normal Patient's awareness of abnormal movements (rate only patient's report): No Awareness, Dental Status Current problems with teeth and/or dentures?: No Does patient usually wear dentures?: No  CIWA:    COWS:     Musculoskeletal: Strength & Muscle Tone: within normal limits Gait & Station: normal Patient leans: N/A  Psychiatric Specialty Exam:  Presentation  General Appearance:  Fairly Groomed  Eye Contact: Fair  Speech: Slow  Speech Volume: Decreased  Handedness: Right   Mood and Affect  Mood: Depressed  Affect: Flat   Thought Process  Thought Processes: Linear  Descriptions of Associations:Intact  Orientation:Full (Time, Place and Person)  Thought Content:-- (concrete)  History of Schizophrenia/Schizoaffective disorder:Yes  Duration of Psychotic Symptoms:Greater than six months  Hallucinations:Hallucinations: None  Ideas of Reference:None  Suicidal  Thoughts:Suicidal Thoughts: No  Homicidal Thoughts:Homicidal Thoughts: No   Sensorium  Memory: Immediate Fair; Recent Fair  Judgment: Poor  Insight: Shallow   Executive Functions  Concentration: Poor  Attention Span: Poor  Recall: [employer] of Knowledge: Fair  Language: Fair   Psychomotor Activity  Psychomotor Activity: Psychomotor Activity: Normal   Assets  Assets: Desire for Improvement; Leisure Time   Sleep  Sleep: Sleep: Fair    Physical Exam: Physical Exam HENT:     Head: Normocephalic.  Eyes:     Extraocular Movements: Extraocular movements intact.  Pulmonary:     Effort: Pulmonary effort is normal.  Neurological:     Mental Status: She is alert.  Psychiatric:        Attention and Perception: She is inattentive.        Mood and Affect: Affect is flat.    ROS Blood pressure 94/64, pulse 95, temperature 98.6 F (37 C), temperature source Oral, resp. rate 17, height 4\' 11"  (1.499 m), weight 63 kg, SpO2 97%. Body mass index is 28.05 kg/m.   Treatment Plan Summary: Daily contact with patient to assess and evaluate symptoms and progress in treatment and Medication management.    Still waiting on safe disposition as patient would not be able to live independently given cognitive impairment. Recommend assisted living.    No medication side effects reported.  Because of urinary incontinence, recommended adult diapers to be worn.   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  Plan:  -Continue Imitrex 25 mg PRN BID for migraines -Continue Colace 100 mg daily for constipation -Continue Vitamin D 50.000 units weekly for bone health. -Continue Sertraline150 mg po Q daily for depression/anxiety -Continue  Buspar 15 mg po bid for anxiety.  -Continue  paliperidone 6 mg po qd for mood stabilization -Continue Trazodone 50 mg nightly for insomnia  -Continue Nicoderm 21 mg topically Q 24 hrs for nicotine withdrawal management. -Continue vitamin B12 1000 mcg IM weekly for 4 weeks then monthly -Continue Melatonin 5 mg nightly for sleep -Continue Protonix EC 40 mg p.o. daily for GERD -Continue MiraLAX 17 g p.o. PRN for constipation -Continue hydroxyzine 25 mg p.o. 3 times daily as needed for anxiety -Continue Ensure nutritional shakes TID in between meals  -Previously discontinued Hydroxyzine 50 mg nightly-hypotension in the mornings   Safety and Monitoring: Voluntary admission to inpatient psychiatric unit for safety, stabilization and treatment Daily contact with patient to assess and evaluate symptoms and progress in treatment Patient's case to be discussed in multi-disciplinary team meeting Observation Level : q15 minute checks Vital signs: q12 hours Precautions: Safety   Discharge Planning: Social work and case management to assist with discharge planning and identification of hospital follow-up needs prior to discharge Estimated LOS: Unknown at this time. Discharge Concerns: Need to establish a safety plan; Medication compliance and effectiveness Discharge Goals: Return home with outpatient referrals for mental health follow-up including medication management/psychotherapy No legal guardian, has payee  Roselle Locus, MD 03/23/2023, 1:54 PM

## 2023-03-23 NOTE — Progress Notes (Signed)
   03/23/23 0900  Psychosocial Assessment  Patient Complaints None  Eye Contact Fair  Facial Expression Sad  Affect Appropriate to circumstance  Speech Logical/coherent  Interaction Childlike  Motor Activity Slow  Appearance/Hygiene Unremarkable  Behavior Characteristics Cooperative;Appropriate to situation  Mood Anxious  Thought Process  Coherency WDL  Content WDL  Delusions None reported or observed  Perception WDL  Hallucination None reported or observed  Judgment Poor  Confusion None  Danger to Self  Current suicidal ideation? Denies  Agreement Not to Harm Self Yes  Description of Agreement Verbal  Danger to Others  Danger to Others None reported or observed

## 2023-03-23 NOTE — Plan of Care (Signed)
Problem: Education: Goal: Utilization of techniques to improve thought processes will improve Outcome: Progressing Goal: Knowledge of the prescribed therapeutic regimen will improve Outcome: Progressing

## 2023-03-24 DIAGNOSIS — F251 Schizoaffective disorder, depressive type: Secondary | ICD-10-CM | POA: Diagnosis not present

## 2023-03-24 NOTE — Group Note (Signed)
[facility] LCSW Group Therapy Note  Date/Time:  03/24/2023 10:00am-11:00am  Type of Therapy and Topic:  Group Therapy:  Healthy and Unhealthy Coping Skills and Supports  Participation Level:  Did Not Attend   Description of Group:  Patients in this group explored the differences between healthy and unhealthy, specifically with coping skills and supports, using the worksheet below.  Many examples were provided by CSW and group members.  The emphasis was on providing hope for change.  A demonstration was given about how to set boundaries with family and/or friends, which patients expressed was beneficial.    Therapeutic Goals:   1)  compare healthy versus unhealthy coping skills and healthy versus unhealthy supports 2)  identify examples of each and demonstrate commonalities within the group  3)  generate ideas of healthy coping skills and supports that can be added  4)  discuss importance of adding healthy supports and setting boundaries with unhealthy supports  5)  offer mutual support about how to address unhealthy coping skills and supports  6)  encourage active participation in group   Healthy vs. 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   Therapeutic Modalities:   Psychoeducation Brief Solution-Focused Therapy  Ambrose Mantle, LCSW

## 2023-03-24 NOTE — Progress Notes (Addendum)
Nursing Note:  Pt intermittently tearful this shift. Shared with this RN that she feels separated from God and "the holy spirit is not within," at times she feels hopeless and that things will never change. Pt encouraged to share her feelings, active listening and support provided. Pt agreed to having Chaplain visit this week, consult placed. Pt encouraged to join the milieu whenever possible to possibly decrease negative thoughts. Pt also shared, "They are trying to get ownership over me, nobody told me this until I went to the meeting last Friday."  This RN explained about what Guardianship entails with focus on advocating for the patients safety and welfare. Pt agreed this sounded better and verbalized understanding.  Other times throughout the shift, noted pt to be smiling and laughing in milieu. Pt shared that her daughter was to visit tonight, "I hope she comes, she said she would."   Addendum: Pts daughter did not come to visit tonight. Pt shared that she is very sad, "sometimes I wonder what would happen if I gave up." Talked 1:1 about challenges of being hospitalized for this long period of time and remaining hopeful for discharging to a good place.  Pt denies SI/HI or AVH. She is taking medications as prescribed.   03/24/23 0900  Psych Admission Type (Psych Patients Only)  Admission Status Involuntary  Psychosocial Assessment  Patient Complaints None  Eye Contact Fair  Facial Expression Sad  Affect Appropriate to circumstance  Speech Logical/coherent  Interaction Childlike  Motor Activity Slow  Appearance/Hygiene Unremarkable  Behavior Characteristics Cooperative;Appropriate to situation  Mood Euthymic  Thought Process  Coherency WDL  Content WDL  Delusions None reported or observed  Perception WDL  Hallucination None reported or observed  Judgment Poor  Confusion None  Danger to Self  Current suicidal ideation? Denies  Agreement Not to Harm Self Yes  Description of  Agreement Verbal  Danger to Others  Danger to Others None reported or observed

## 2023-03-24 NOTE — Plan of Care (Signed)
Problem: Education: Goal: Utilization of techniques to improve thought processes will improve Outcome: Progressing   Problem: Activity: Goal: Interest or engagement in leisure activities will improve Outcome: Progressing   Problem: Coping: Goal: Coping ability will improve Outcome: Progressing

## 2023-03-24 NOTE — Progress Notes (Signed)
   03/23/23 2253  Psych Admission Type (Psych Patients Only)  Admission Status Involuntary  Psychosocial Assessment  Eye Contact Fair  Facial Expression Animated;Anxious  Affect Appropriate to circumstance  Speech Logical/coherent  Interaction Childlike  Motor Activity Slow  Appearance/Hygiene Unremarkable  Behavior Characteristics Cooperative;Appropriate to situation  Mood Anxious  Thought Process  Coherency WDL  Content WDL  Delusions None reported or observed  Perception WDL  Hallucination None reported or observed  Judgment Poor  Confusion None  Danger to Self  Current suicidal ideation? Denies  Agreement Not to Harm Self Yes  Description of Agreement verbal

## 2023-03-24 NOTE — Progress Notes (Signed)
   03/24/23 2223  Psych Admission Type (Psych Patients Only)  Admission Status Involuntary  Psychosocial Assessment  Patient Complaints None  Eye Contact Fair  Facial Expression Flat  Affect Appropriate to circumstance  Speech Logical/coherent  Interaction Childlike  Motor Activity Other (Comment) (WDL)  Appearance/Hygiene Unremarkable  Behavior Characteristics Appropriate to situation  Mood Depressed  Thought Process  Coherency WDL  Content WDL  Delusions None reported or observed  Perception WDL  Hallucination None reported or observed  Judgment Poor  Confusion None  Danger to Self  Current suicidal ideation? Denies  Self-Injurious Behavior No self-injurious ideation or behavior indicators observed or expressed   Agreement Not to Harm Self Yes  Description of Agreement verbal  Danger to Others  Danger to Others None reported or observed

## 2023-03-24 NOTE — Plan of Care (Signed)
  Problem: Education: Goal: Utilization of techniques to improve thought processes will improve Outcome: Progressing Goal: Knowledge of the prescribed therapeutic regimen will improve Outcome: Progressing   Problem: Activity: Goal: Interest or engagement in leisure activities will improve Outcome: Progressing   Problem: Coping: Goal: Coping ability will improve Outcome: Progressing   Problem: Role Relationship: Goal: Will demonstrate positive changes in social behaviors and relationships Outcome: Progressing

## 2023-03-24 NOTE — Progress Notes (Signed)
[facility] MD Progress Note  03/24/2023 12:18 PM Shavette TIERNEY WOLDEN  MRN:  [id number] Subjective:  Yolanda [name] was seen after breakfast in her room on rounds. She was sleeping but easily aroused. She nods some answers but takes encouragement to vocalize. She says she is doing "good". She has low energy and has to be re-awoken several times. She feels that she slept well last night. She is not interested in going to groups, but they were encouraged. No hallucinations, thoughts of harm to self or others expressed.  Principal Problem: Schizoaffective disorder, depressive type (HCC) Diagnosis: Principal Problem:   Schizoaffective disorder, depressive type (HCC) Active Problems:   GERD (gastroesophageal reflux disease)   Intellectual disability   Tobacco use disorder  Total Time spent with patient: 15 minutes  Past Psychiatric History: see H&P  Past Medical History:  Past Medical History:  Diagnosis Date   Bipolar affect, depressed (HCC)    Constipation 08/17/2022   Depression    Falls 07/21/2022   Fracture of femoral neck, right, closed (HCC) 01/17/2022   Herpes simplex 08/22/2017   Open fracture dislocation of right elbow joint 01/17/2022    Past Surgical History:  Procedure Laterality Date   NO PAST SURGERIES     SALPINGECTOMY     Family History: History reviewed. No pertinent family history. Family Psychiatric  History:  see H&P Social History:  Social History   Substance and Sexual Activity  Alcohol Use Yes     Social History   Substance and Sexual Activity  Drug Use Yes   Types: Cocaine, Marijuana    Social History   Socioeconomic History   Marital status: Single    Spouse name: Not on file   Number of children: Not on file   Years of education: Not on file   Highest education level: Not on file  Occupational History   Not on file  Tobacco Use   Smoking status: Every Day   Smokeless tobacco: Not on file  Substance and Sexual Activity   Alcohol use: Yes   Drug use:  Yes    Types: Cocaine, Marijuana   Sexual activity: Yes  Other Topics Concern   Not on file  Social History Narrative   Not on file   Social Determinants of Health   Financial Resource Strain: Low Risk  (09/12/2022)   Received from [facility]   Overall Financial Resource Strain (CARDIA)    Difficulty of Paying Living Expenses: Not hard at all  Food Insecurity: Patient Declined (12/20/2022)   Hunger Vital Sign    Worried About Running Out of Food in the Last Year: Patient declined    Ran Out of Food in the Last Year: Patient declined  Transportation Needs: No Transportation Needs (12/20/2022)   PRAPARE - Administrator, Civil Service (Medical): No    Lack of Transportation (Non-Medical): No  Physical Activity: Not on file  Stress: No Stress Concern Present (07/17/2022)   Received from [facility] - Occupational Stress Questionnaire    Feeling of Stress : Not at all  Social Connections: Unknown (07/16/2022)   Received from [facility], [facility]   Social Network    Social Network: Not on file   Additional Social History:                         Sleep: Fair  Appetite:  Fair  Current Medications: Current Facility-Administered Medications  Medication Dose Route Frequency Provider Last Rate Last Admin   acetaminophen (TYLENOL) tablet 650 mg  650 mg Oral Q6H PRN [name], NP   650 mg at 03/22/23 1951   alum & mag hydroxide-simeth (MAALOX/MYLANTA) 200-200-20 MG/5ML suspension 30 mL  30 mL Oral Q4H PRN [name], NP   30 mL at 01/28/23 1530   busPIRone (BUSPAR) tablet 15 mg  15 mg Oral BID [name], NP   15 mg at 03/24/23 0853   cyanocobalamin (VITAMIN B12) injection 1,000 mcg  1,000 mcg Intramuscular Q30 days Sarita Bottom, MD   1,000 mcg at 03/04/23 1421   diphenhydrAMINE (BENADRYL) capsule 50 mg  50 mg Oral TID PRN [name], NP   50 mg at 01/15/23 1211   Or    diphenhydrAMINE (BENADRYL) injection 50 mg  50 mg Intramuscular TID PRN [name], NP       feeding supplement (ENSURE ENLIVE / ENSURE PLUS) liquid 237 mL  237 mL Oral TID BM Nkwenti, Doris, NP   237 mL at 03/24/23 0854   haloperidol (HALDOL) tablet 5 mg  5 mg Oral TID PRN [name], NP   5 mg at 01/15/23 1211   Or   haloperidol lactate (HALDOL) injection 5 mg  5 mg Intramuscular TID PRN [name], NP       hydrOXYzine (ATARAX) tablet 25 mg  25 mg Oral TID PRN [name], DO   25 mg at 03/22/23 1951   LORazepam (ATIVAN) tablet 2 mg  2 mg Oral TID PRN [name], NP   2 mg at 01/15/23 1211   Or   LORazepam (ATIVAN) injection 2 mg  2 mg Intramuscular TID PRN [name], NP       magnesium hydroxide (MILK OF MAGNESIA) suspension 30 mL  30 mL Oral Daily PRN [name], NP   30 mL at 03/22/23 2112   melatonin tablet 5 mg  5 mg Oral QHS Nkwenti, Doris, NP   5 mg at 03/23/23 2044   nicotine (NICODERM CQ - dosed in mg/24 hours) patch 21 mg  21 mg Transdermal Daily [name], DO   21 mg at 03/06/23 7829   nicotine polacrilex (NICORETTE) gum 2 mg  2 mg Oral PRN [name], MD   2 mg at 03/22/23 1951   paliperidone (INVEGA) 24 hr tablet 6 mg  6 mg Oral Daily [name], NP   6 mg at 03/23/23 2047   pantoprazole (PROTONIX) EC tablet 40 mg  40 mg Oral Daily [name], NP   40 mg at 03/24/23 0853   polyethylene glycol (MIRALAX / GLYCOLAX) packet 17 g  17 g Oral Daily PRN [name], NP   17 g at 03/20/23 1744   sertraline (ZOLOFT) tablet 150 mg  150 mg Oral Daily Massengill, Harrold Donath, MD   150 mg at 03/24/23 0853   SUMAtriptan (IMITREX) tablet 25 mg  25 mg Oral BID PRN [name], NP   25 mg at 03/20/23 1603   traZODone (DESYREL) tablet 50 mg  50 mg Oral QHS [name], NP   50 mg at 03/23/23 2044   Vitamin D (Ergocalciferol) (DRISDOL) 1.25 MG (50000 UNIT) capsule 50,000 Units  50,000 Units Oral Q7 days [name], NP   50,000 Units at 03/23/23 1823     Lab Results: No results found for this or any previous visit (from the past 48 hour(s)).  Blood Alcohol level:  Lab Results  Component Value Date  ETH <10 12/19/2022   ETH <10 11/21/2019    Metabolic Disorder Labs: Lab Results  Component Value Date   HGBA1C 4.4 (L) 12/21/2022   MPG 79.58 12/21/2022   No results found for: "PROLACTIN" Lab Results  Component Value Date   CHOL 218 (H) 12/21/2022   TRIG 81 12/21/2022   HDL 53 12/21/2022   CHOLHDL 4.1 12/21/2022   VLDL 16 12/21/2022   LDLCALC 149 (H) 12/21/2022   LDLCALC 110 (H) 03/13/2011    Physical Findings: AIMS: Facial and Oral Movements Muscles of Facial Expression: None, normal Lips and Perioral Area: None, normal Jaw: None, normal Tongue: None, normal,Extremity Movements Upper (arms, wrists, hands, fingers): None, normal Lower (legs, knees, ankles, toes): None, normal, Trunk Movements Neck, shoulders, hips: None, normal, Overall Severity Severity of abnormal movements (highest score from questions above): None, normal Incapacitation due to abnormal movements: None, normal Patient's awareness of abnormal movements (rate only patient's report): No Awareness, Dental Status Current problems with teeth and/or dentures?: No Does patient usually wear dentures?: No  CIWA:    COWS:     Musculoskeletal: Strength & Muscle Tone: within normal limits Gait & Station: normal Patient leans: N/A  Psychiatric Specialty Exam:  Presentation  General Appearance:  Fairly Groomed  Eye Contact: Fleeting  Speech: Slow  Speech Volume: Decreased  Handedness: Right   Mood and Affect  Mood: Depressed  Affect: Flat   Thought Process  Thought Processes: Linear  Descriptions of Associations:Intact  Orientation:Full (Time, Place and Person)  Thought Content:Logical  History of Schizophrenia/Schizoaffective disorder:Yes  Duration of Psychotic Symptoms:Greater than six  months  Hallucinations:Hallucinations: None  Ideas of Reference:None  Suicidal Thoughts:Suicidal Thoughts: No  Homicidal Thoughts:Homicidal Thoughts: No   Sensorium  Memory: Immediate Fair; Recent Poor  Judgment: Poor  Insight: Poor   Executive Functions  Concentration: Poor  Attention Span: Poor  Recall: Poor  Fund of Knowledge: Poor  Language: Fair   Psychomotor Activity  Psychomotor Activity: Psychomotor Activity: Decreased   Assets  Assets: Desire for Improvement; Leisure Time   Sleep  Sleep: Sleep: Fair    Physical Exam: Physical Exam HENT:     Head: Normocephalic.  Eyes:     Extraocular Movements: Extraocular movements intact.  Pulmonary:     Effort: Pulmonary effort is normal.  Neurological:     General: No focal deficit present.     Mental Status: She is oriented to person, place, and time and easily aroused.  Psychiatric:        Behavior: Behavior is slowed.    ROS Blood pressure 118/76, pulse (!) 103, temperature 97.6 F (36.4 C), temperature source Oral, resp. rate 17, height 4\' 11"  (1.499 m), weight 63 kg, SpO2 100%. Body mass index is 28.05 kg/m.   Treatment Plan Summary: Daily contact with patient to assess and evaluate symptoms and progress in treatment and Medication management.    Still waiting on safe disposition as patient would not be able to live independently given cognitive impairment. Recommend assisted living.    No medication side effects reported.  Because of urinary incontinence, recommended adult diapers to be worn.   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  Plan:  -Continue Imitrex 25 mg PRN BID for migraines -Continue Colace 100 mg daily for  constipation -Continue Vitamin D 50.000 units weekly for bone health. -Continue Sertraline150 mg po Q  daily for depression/anxiety -Continue Buspar 15 mg po bid for anxiety.  -Continue paliperidone 6 mg po qd for mood stabilization -Continue Trazodone 50 mg nightly for insomnia  -Continue Nicoderm 21 mg topically Q 24 hrs for nicotine withdrawal management. -Continue vitamin B12 1000 mcg IM weekly for 4 weeks then monthly -Continue Melatonin 5 mg nightly for sleep -Continue Protonix EC 40 mg p.o. daily for GERD -Continue MiraLAX 17 g p.o. PRN for constipation -Continue hydroxyzine 25 mg p.o. 3 times daily as needed for anxiety -Continue Ensure nutritional shakes TID in between meals  -Previously discontinued Hydroxyzine 50 mg nightly-hypotension in the mornings   Safety and Monitoring: Voluntary admission to inpatient psychiatric unit for safety, stabilization and treatment Daily contact with patient to assess and evaluate symptoms and progress in treatment Patient's case to be discussed in multi-disciplinary team meeting Observation Level : q15 minute checks Vital signs: q12 hours Precautions: Safety   Discharge Planning: Social work and case management to assist with discharge planning and identification of hospital follow-up needs prior to discharge Estimated LOS: Unknown at this time. Discharge Concerns: Need to establish a safety plan; Medication compliance and effectiveness Discharge Goals: Return home with outpatient referrals for mental health follow-up including medication management/psychotherapy No legal guardian, has payee  Roselle Locus, MD 03/24/2023, 12:18 PM

## 2023-03-24 NOTE — [facility] Group Notes (Signed)
[facility] Group Notes:  (Nursing/MHT/Case Management/Adjunct)  Date:  03/24/2023  Time:  2000  Type of Therapy:   Wrap up group  Participation Level:  Active  Participation Quality:  Appropriate, Attentive, Sharing, and Supportive  Affect:  Anxious and Not Congruent  Cognitive:  Lacking  Insight:  Limited  Engagement in Group:  Engaged  Modes of Intervention:  Clarification, Education, and Socialization  Summary of Progress/Problems: Positive thinking and self-care were discussed.   [name] 03/24/2023, 8:55 PM

## 2023-03-25 DIAGNOSIS — F251 Schizoaffective disorder, depressive type: Secondary | ICD-10-CM | POA: Diagnosis not present

## 2023-03-25 NOTE — Progress Notes (Signed)
   03/25/23 2300  Psych Admission Type (Psych Patients Only)  Admission Status Involuntary  Psychosocial Assessment  Patient Complaints None  Eye Contact Fair  Facial Expression Animated  Affect Appropriate to circumstance  Speech Logical/coherent  Interaction Assertive  Motor Activity Slow  Appearance/Hygiene Unremarkable  Behavior Characteristics Appropriate to situation;Cooperative  Mood Pleasant  Thought Process  Coherency WDL  Content WDL  Delusions None reported or observed  Perception WDL  Hallucination None reported or observed  Judgment Poor  Confusion None  Danger to Self  Current suicidal ideation? Denies  Self-Injurious Behavior No self-injurious ideation or behavior indicators observed or expressed   Agreement Not to Harm Self Yes  Description of Agreement verbal

## 2023-03-25 NOTE — Progress Notes (Signed)
   03/25/23 0524  15 Minute Checks  Location Bedroom  Visual Appearance Calm  Behavior Sleeping  Sleep (Behavioral Health Patients Only)  Calculate sleep? (Click Yes once per 24 hr at 0600 safety check) Yes  Documented sleep last 24 hours 7.25

## 2023-03-25 NOTE — Plan of Care (Signed)
Problem: Education: Goal: Knowledge of the prescribed therapeutic regimen will improve Outcome: Progressing   Problem: Activity: Goal: Imbalance in normal sleep/wake cycle will improve Outcome: Progressing

## 2023-03-25 NOTE — Progress Notes (Signed)
[facility] MD Progress Note  03/25/2023 4:30 PM Yolanda [name]  MRN:  [id number]  Principal Problem: Schizoaffective disorder, depressive type (HCC)  Diagnosis: Principal Problem:   Schizoaffective disorder, depressive type (HCC) Active Problems:   GERD (gastroesophageal reflux disease)   Intellectual disability   Tobacco use disorder  Reason for admission: This is the first psychiatric admission in this [facility] in 12 years for this 47 AA female with an extensive hx of mental illnesses & probable polysubstance use disorders. She is admitted to the [facility] from the [facility] hospital with complain of worsening suicidal ideations with plan to stab herself. Per chart review, patient apparently reported at the ED that she has been depressed for a while & has not been taking her mental health medications. After medical evaluation.clearance, she was transferred to the [facility] for further psychiatric evaluation/treatments.   24 hr chart review: Sleep Hours last night: Fair as per patient & good as per nsg reports Nursing Concerns: None noted. Behavioral episodes in the past 24 hrs: Isolative to the room  Medication Compliance: Compliant  Vital Signs in the past 24 hrs: WNL PRN Medications in the past 24 hrs: Tylenol  Patient assessment note:  On assessment today, mood continues to be depressed and affect is congruent.  Verbalizes continuing to feel sad related to continued hospitalization.  Verbalizes frustration at remaining hospitalized, but verbalizes understanding that we are doing our best as her treatment team to coordinate with DSS to get her an appropriate placement in the community. Reports that anxiety is manageable today. Sleep is fair as per patient. Appetite is fair, Concentration is fair   Energy level is fair Denies suicidal thoughts. Denies suicidal intent and plan.  Denies having any HI.  Denies having psychotic symptoms. Denies AVH, denies paranoia and denies all first rank symptoms.  Reports that she is continuing to have  migraines, but that the Imitrex is helpful. Continuing all meds as listed below with no changes today.  Denies having side effects to current psychiatric medications.  We discussed keeping current medications same with no changes today.  Discussed the following psychosocial stressors: Continuous hospitalization.  Empathy and active listening provided.  Patient educated to get out of her room and engage in activities in the day room with her peers, and stop isolating herself in the room.  Total Time spent with patient:  35 minutes  Past Psychiatric History:  See H & P  Past Medical History:  Past Medical History:  Diagnosis Date   Bipolar affect, depressed (HCC)    Constipation 08/17/2022   Depression    Falls 07/21/2022   Fracture of femoral neck, right, closed (HCC) 01/17/2022   Herpes simplex 08/22/2017   Open fracture dislocation of right elbow joint 01/17/2022    Past Surgical History:  Procedure Laterality Date   NO PAST SURGERIES     SALPINGECTOMY     Family History: History reviewed. No pertinent family history.  Family Psychiatric  History: See H & P  Social History:  Social History   Substance and Sexual Activity  Alcohol Use Yes     Social History   Substance and Sexual Activity  Drug Use Yes   Types: Cocaine, Marijuana    Social History   Socioeconomic History   Marital status: Single    Spouse name: Not on file   Number of children: Not on file   Years of education: Not on file   Highest education level: Not on file  Occupational History  Not on file  Tobacco Use   Smoking status: Every Day   Smokeless tobacco: Not on file  Substance and Sexual Activity   Alcohol use: Yes   Drug use: Yes    Types: Cocaine, Marijuana   Sexual activity: Yes  Other Topics Concern   Not on file  Social History Narrative   Not on file   Social Determinants of Health   Financial Resource Strain: Low Risk  (09/12/2022)    Received from [facility], [facility]   Overall Financial Resource Strain (CARDIA)    Difficulty of Paying Living Expenses: Not hard at all  Food Insecurity: Patient Declined (12/20/2022)   Hunger Vital Sign    Worried About Running Out of Food in the Last Year: Patient declined    Ran Out of Food in the Last Year: Patient declined  Transportation Needs: No Transportation Needs (12/20/2022)   PRAPARE - Administrator, Civil Service (Medical): No    Lack of Transportation (Non-Medical): No  Physical Activity: Not on file  Stress: No Stress Concern Present (07/17/2022)   Received from [facility], [facility] - Occupational Stress Questionnaire    Feeling of Stress : Not at all  Social Connections: Unknown (07/16/2022)   Received from [facility], [facility]   Social Network    Social Network: Not on file   Sleep: Good  Appetite:  Good  Current Medications: Current Facility-Administered Medications  Medication Dose Route Frequency Provider Last Rate Last Admin   acetaminophen (TYLENOL) tablet 650 mg  650 mg Oral Q6H PRN [name], NP   650 mg at 03/24/23 1848   alum & mag hydroxide-simeth (MAALOX/MYLANTA) 200-200-20 MG/5ML suspension 30 mL  30 mL Oral Q4H PRN [name], NP   30 mL at 01/28/23 1530   busPIRone (BUSPAR) tablet 15 mg  15 mg Oral BID [name], NP   15 mg at 03/25/23 0951   cyanocobalamin (VITAMIN B12) injection 1,000 mcg  1,000 mcg Intramuscular Q30 days [name], Nadir, MD   1,000 mcg at 03/04/23 1421   diphenhydrAMINE (BENADRYL) capsule 50 mg  50 mg Oral TID PRN [name], NP   50 mg at 01/15/23 1211   Or   diphenhydrAMINE (BENADRYL) injection 50 mg  50 mg Intramuscular TID PRN [name], NP       feeding supplement (ENSURE ENLIVE / ENSURE PLUS) liquid 237 mL  237 mL Oral TID BM Aylan Bayona, NP   237 mL at 03/25/23 1002   haloperidol (HALDOL) tablet 5 mg  5 mg Oral TID PRN [name], NP   5 mg at 01/15/23 1211   Or   haloperidol lactate (HALDOL) injection 5 mg  5 mg Intramuscular TID PRN [name], NP       hydrOXYzine (ATARAX) tablet 25 mg  25 mg Oral TID PRN [name], DO   25 mg at 03/22/23 1951   LORazepam (ATIVAN) tablet 2 mg  2 mg Oral TID PRN [name], NP   2 mg at 01/15/23 1211   Or   LORazepam (ATIVAN) injection 2 mg  2 mg Intramuscular TID PRN [name], NP       magnesium hydroxide (MILK OF MAGNESIA) suspension 30 mL  30 mL Oral Daily PRN [name], NP   30 mL at 03/22/23 2112   melatonin tablet 5 mg  5 mg Oral QHS Tracy Kinner, NP   5 mg at  03/24/23 2059   nicotine (NICODERM CQ - dosed in mg/24 hours) patch 21 mg  21 mg Transdermal Daily [name], DO   21 mg at 03/25/23 1914   nicotine polacrilex (NICORETTE) gum 2 mg  2 mg Oral PRN [name], MD   2 mg at 03/22/23 1951   paliperidone (INVEGA) 24 hr tablet 6 mg  6 mg Oral Daily Savion Washam, Tyler Aas, NP   6 mg at 03/24/23 2100   pantoprazole (PROTONIX) EC tablet 40 mg  40 mg Oral Daily [name], NP   40 mg at 03/25/23 0951   polyethylene glycol (MIRALAX / GLYCOLAX) packet 17 g  17 g Oral Daily PRN [name], NP   17 g at 03/20/23 1744   sertraline (ZOLOFT) tablet 150 mg  150 mg Oral Daily Massengill, Harrold Donath, MD   150 mg at 03/25/23 0951   SUMAtriptan (IMITREX) tablet 25 mg  25 mg Oral BID PRN [name], NP   25 mg at 03/20/23 1603   traZODone (DESYREL) tablet 50 mg  50 mg Oral QHS Constantino Starace, Tyler Aas, NP   50 mg at 03/24/23 2100   Vitamin D (Ergocalciferol) (DRISDOL) 1.25 MG (50000 UNIT) capsule 50,000 Units  50,000 Units Oral Q7 days [name], NP   50,000 Units at 03/23/23 1823   Lab Results:  No results found for this or any previous visit (from the past 48 hour(s)).  Blood Alcohol level:  Lab Results  Component Value Date   ETH <10 12/19/2022   ETH <10 11/21/2019   Metabolic Disorder Labs: Lab Results  Component Value Date   HGBA1C 4.4 (L)  12/21/2022   MPG 79.58 12/21/2022   No results found for: "PROLACTIN" Lab Results  Component Value Date   CHOL 218 (H) 12/21/2022   TRIG 81 12/21/2022   HDL 53 12/21/2022   CHOLHDL 4.1 12/21/2022   VLDL 16 12/21/2022   LDLCALC 149 (H) 12/21/2022   LDLCALC 110 (H) 03/13/2011   Physical Findings: AIMS: Facial and Oral Movements Muscles of Facial Expression: None, normal Lips and Perioral Area: None, normal Jaw: None, normal Tongue: None, normal,Extremity Movements Upper (arms, wrists, hands, fingers): None, normal Lower (legs, knees, ankles, toes): None, normal, Trunk Movements Neck, shoulders, hips: None, normal, Overall Severity Severity of abnormal movements (highest score from questions above): None, normal Incapacitation due to abnormal movements: None, normal Patient's awareness of abnormal movements (rate only patient's report): No Awareness, Dental Status Current problems with teeth and/or dentures?: No Does patient usually wear dentures?: No  CIWA:    COWS:    AIMS:0 Musculoskeletal: Strength & Muscle Tone: within normal limits Gait & Station: normal Patient leans: N/A  Psychiatric Specialty Exam:  Presentation  General Appearance:  Appropriate for Environment; Casual  Eye Contact: Limited to baseline Speech: Decreased amount, decreased tone and volume but at baseline Speech Volume: Normal  Handedness: Right  Mood and Affect  Mood: Sad and depressed mainly secondary to being in the hospital with no place to go Affect: Congruent  Thought Process  Thought Processes: Coherent  Descriptions of Associations:Intact  Orientation:Full (Time, Place and Person)  Thought Content:Logical; WDL Concrete History of Schizophrenia/Schizoaffective disorder:Yes  Duration of Psychotic Symptoms:Greater than six months  Hallucinations:Hallucinations: None  Denies AVH, does not appear responding to stimuli  Ideas of Reference: None  Suicidal  Thoughts:Suicidal Thoughts: No  Denies passive or active SI intention or plan   Homicidal Thoughts:Homicidal Thoughts: No  Denies HI  Sensorium  Memory: Immediate Good  Judgment: Fair  Insight: Fair  Chartered certified accountant: Fair  Attention Span: Fair  Recall: [employer] of Knowledge: Fair  Language: Fair  Psychomotor Activity  Psychomotor Activity: Psychomotor Activity: Normal    Assets  Assets: Resilience  Sleep  Sleep: Sleep: Good    Physical Exam: Physical Exam Vitals and nursing note reviewed.  Genitourinary:    Comments: Deferred Neurological:     General: No focal deficit present.     Mental Status: She is oriented to person, place, and time.  Psychiatric:        Mood and Affect: Mood normal.        Behavior: Behavior normal.        Thought Content: Thought content normal.    Review of Systems  Constitutional:  Negative for fever.  HENT:  Negative for hearing loss.   Eyes:  Negative for blurred vision.  Respiratory:  Negative for cough.   Cardiovascular:  Negative for chest pain.  Gastrointestinal:  Negative for heartburn.  Genitourinary:  Negative for dysuria.  Musculoskeletal:  Negative for myalgias.  Skin:  Negative for rash.  Neurological:  Negative for dizziness.  Psychiatric/Behavioral:  Positive for depression. Negative for hallucinations, memory loss, substance abuse and suicidal ideas. The patient is nervous/anxious and has insomnia.   All other systems reviewed and are negative.  Blood pressure 118/76, pulse (!) 103, temperature 97.6 F (36.4 C), temperature source Oral, resp. rate 17, height 4\' 11"  (1.499 m), weight 63 kg, SpO2 100%. Body mass index is 28.05 kg/m.  Treatment Plan Summary: Daily contact with patient to assess and evaluate symptoms and progress in treatment and Medication management.    Still waiting on safe disposition as patient would not be able to live independently given cognitive  impairment. Recommend assisted living.    No medication side effects reported.  Because of urinary incontinence, recommended adult diapers to be worn.   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  Plan:  -Continue Imitrex 25 mg PRN BID for migraines -Continue Colace 100 mg daily for constipation -Continue Vitamin D 50.000 units weekly for bone health. -Continue Sertraline150 mg po Q daily for depression/anxiety -Continue Buspar 15 mg po bid for anxiety.  -Continue paliperidone 6 mg po qd for mood stabilization -Continue Trazodone 50 mg nightly for insomnia  -Continue Nicoderm 21 mg topically Q 24 hrs for nicotine withdrawal management. -Continue vitamin B12 1000 mcg IM weekly for 4 weeks then monthly -Continue Melatonin 5 mg nightly for sleep -Continue Protonix EC 40 mg p.o. daily for GERD -Continue MiraLAX 17 g p.o. PRN for constipation -Continue hydroxyzine 25 mg p.o. 3 times daily as needed for anxiety -Continue Ensure nutritional shakes TID in between meals  -Previously discontinued Hydroxyzine 50 mg nightly-hypotension in the mornings   Safety and Monitoring: Voluntary admission to inpatient psychiatric unit for safety, stabilization and treatment Daily contact with patient to assess and evaluate symptoms and progress in treatment Patient's case to be discussed in multi-disciplinary team meeting Observation Level : q15 minute checks Vital signs: q12 hours Precautions: Safety   Discharge Planning: Social work and case management to assist with discharge planning and identification of hospital follow-up needs prior to discharge Estimated LOS: Unknown at this time. Discharge Concerns: Need to establish a safety plan; Medication compliance and  effectiveness Discharge Goals: Return home with outpatient referrals for mental health follow-up including  medication management/psychotherapy No legal guardian, has payee  [name], NP,  03/25/2023, 4:30 PM Patient ID: Yolanda [name], female   DOB: [birth year], 47 y.o.   MRN: [id number]  Patient ID: Yolanda [name], female   DOB: 1975/12/29, 47 y.o.   MRN: [id number]

## 2023-03-25 NOTE — BHH Group Notes (Signed)

## 2023-03-25 NOTE — Group Note (Signed)
Recreation Therapy Group Note   Group Topic:Team Building  Group Date: 03/25/2023 Start Time: 0935 End Time: 0955 Facilitators: Kaily Wragg-McCall, LRT,CTRS Location: [address] Dayroom   Goal Area(s) Addresses:  Patient will effectively work with peer towards shared goal.  Patient will identify skills used to make activity successful.  Patient will identify how skills used during activity can be applied to reach post d/c goals.   Intervention: STEM Activity- Glass blower/designer  Group Description: Tallest Pharmacist, community. In teams of 5-6, patients were given 11 craft pipe cleaners. Using the materials provided, patients were instructed to compete again the opposing team(s) to build the tallest free-standing structure from floor level. The activity was timed; difficulty increased by Clinical research associate as Production designer, theatre/television/film continued.  Systematically resources were removed with additional directions for example, placing one arm behind their back, working in silence, and shape stipulations. LRT facilitated post-activity discussion reviewing team processes and necessary communication skills involved in completion. Patients were encouraged to reflect how the skills utilized, or not utilized, in this activity can be incorporated to positively impact support systems post discharge.   Affect/Mood: N/A   Participation Level: Did not attend    Clinical Observations/Individualized Feedback:     Plan: Continue to engage patient in RT group sessions 2-3x/week.   Kash Mothershead-McCall, LRT,CTRS 03/25/2023 12:09 PM

## 2023-03-25 NOTE — Plan of Care (Signed)
  Assumed care of patient this am, she presented A&O,pleasant but apprehensive to being cooperative initially.Patient affect  sad but denied SI/HI, plan or intent. Patient was visible in the milieu, attended scheduled groups , interacting with  select peers, and compliant with taking her most medications. Pt focused on attending a relative funeral next. Will continue to assess for safety and support according to plan of care. Problem: Education: Goal: Knowledge of the prescribed therapeutic regimen will improve Outcome: Not Progressing   Problem: Activity: Goal: Interest or engagement in leisure activities will improve Outcome: Progressing   Problem: Coping: Goal: Coping ability will improve Outcome: Not Progressing Goal: Will verbalize feelings Outcome: Progressing

## 2023-03-25 NOTE — Progress Notes (Signed)
Chaplain met with Yolanda [name] for ongoing spiritual and emotional support.  She is tired of being here and losing hope. This is very much connected with her spiritual distress because she feels abandoned here by family, by the system and by God. She feels that she doesn't know what she is supposed to do.  She is very focused on not knowing whether to use Lord or God.  She also doesn't know whether to read her Bible and she doesn't know how to pray.  Chaplain provided listening and normalized that when we feel abandoned by others, sometimes that feels like God. Chaplain affirmed that she doesn't have to do anything to earn God's love.   [address], Bcc Pager, [phone]

## 2023-03-25 NOTE — Group Note (Signed)
Date:  03/25/2023 Time:  9:38 AM  Group Topic/Focus:  Goals Group:   The focus of this group is to help patients establish daily goals to achieve during treatment and discuss how the patient can incorporate goal setting into their daily lives to aide in recovery. Orientation:   The focus of this group is to educate the patient on the purpose and policies of crisis stabilization and provide a format to answer questions about their admission.  The group details unit policies and expectations of patients while admitted.    Participation Level:  Did Not Attend   Additional Comments:  Patient was encouraged to attend group multiple times. Patient's goal was to get a discharge plan.   Yolanda [name] T Yolanda [name] 03/25/2023, 9:38 AM

## 2023-03-26 DIAGNOSIS — F251 Schizoaffective disorder, depressive type: Secondary | ICD-10-CM | POA: Diagnosis not present

## 2023-03-26 MED ORDER — WHITE PETROLATUM EX OINT
TOPICAL_OINTMENT | CUTANEOUS | Status: AC
  Filled 2023-03-26: qty 5

## 2023-03-26 NOTE — Group Note (Signed)
Recreation Therapy Group Note   Group Topic:Animal Assisted Therapy   Group Date: 03/26/2023 Start Time: 8295 End Time: 1035 Facilitators: Elazar Argabright, Benito Mccreedy, LRT Location: [address] Dayroom  Animal-Assisted Activity (AAA) Program Checklist/Progress Note Patient Eligibility Criteria Checklist & Daily Group note for Rec Tx Intervention   AAA/T Program Assumption of Risk Form signed by Patient/ or Parent Legal Guardian YES  Patient understands their participation is voluntary YES    Group Description: Patients provided opportunity to interact with trained and credentialed [employer] Therapy dog and the community volunteer/dog handler.   Affect/Mood: N/A   Participation Level: Did not attend    Clinical Observations/Individualized Feedback: Pt did not join group session following invitation to open dayroom for AAA programming.   Benito Mccreedy Dorrene Bently, LRT, CTRS 03/26/2023 2:12 PM

## 2023-03-26 NOTE — Group Note (Signed)
LCSW Group Therapy Note   Group Date: 03/26/2023 Start Time: 1100 End Time: 1200   Type of Therapy and Topic:  Group Therapy: Boundaries  Participation Level:  Did Not Attend  Description of Group: This group will address the use of boundaries in their personal lives. Patients will explore why boundaries are important, the difference between healthy and unhealthy boundaries, and negative and postive outcomes of different boundaries and will look at how boundaries can be crossed.  Patients will be encouraged to identify current boundaries in their own lives and identify what kind of boundary is being set. Facilitators will guide patients in utilizing problem-solving interventions to address and correct types boundaries being used and to address when no boundary is being used. Understanding and applying boundaries will be explored and addressed for obtaining and maintaining a balanced life. Patients will be encouraged to explore ways to assertively make their boundaries and needs known to significant others in their lives, using other group members and facilitator for role play, support, and feedback.  Therapeutic Goals:  1.  Patient will identify areas in their life where setting clear boundaries could be  used to improve their life.  2.  Patient will identify signs/triggers that a boundary is not being respected. 3.  Patient will identify two ways to set boundaries in order to achieve balance in  their lives: 4.  Patient will demonstrate ability to communicate their needs and set boundaries  through discussion and/or role plays   Therapeutic Modalities:   Cognitive Behavioral Therapy Solution-Focused Therapy  Ane Payment, LCSW 03/26/2023  1:15 PM

## 2023-03-26 NOTE — Plan of Care (Signed)
Problem: Education: Goal: Knowledge of the prescribed therapeutic regimen will improve Outcome: Progressing   Problem: Activity: Goal: Interest or engagement in leisure activities will improve Outcome: Progressing

## 2023-03-26 NOTE — Plan of Care (Signed)
  Problem: Education: Goal: Utilization of techniques to improve thought processes will improve Outcome: Progressing   Problem: Activity: Goal: Interest or engagement in leisure activities will improve Outcome: Progressing   Problem: Coping: Goal: Coping ability will improve Outcome: Progressing   

## 2023-03-26 NOTE — BHH Group Notes (Signed)
B[facility] Group Notes:  (Nursing/MHT/Case Management/Adjunct)  Date:  03/26/2023  Time:  9:22 AM  Type of Therapy:  Group Topic/ Focus: Goals Group: The focus of this group is to help patients establish daily goals to achieve during treatment and discuss how the patient can incorporate goal setting into their daily lives to aide in recovery.   Participation Level:  Did Not Attend  Summary of Progress/Problems:  Patient did not attend goals group today. Patient was encouraged but refused.   Osvaldo Human R Sadako Cegielski 03/26/2023, 9:22 AM

## 2023-03-26 NOTE — Progress Notes (Signed)
   03/26/23 1100  Psych Admission Type (Psych Patients Only)  Admission Status Involuntary  Psychosocial Assessment  Patient Complaints None  Eye Contact Fair  Facial Expression Sad  Affect Appropriate to circumstance  Speech Logical/coherent  Interaction Assertive  Motor Activity Slow  Appearance/Hygiene Unremarkable  Behavior Characteristics Cooperative;Appropriate to situation  Mood Depressed;Pleasant  Thought Process  Coherency WDL  Content WDL  Delusions None reported or observed  Perception WDL  Hallucination None reported or observed  Judgment Poor  Confusion None  Danger to Self  Current suicidal ideation? Denies  Self-Injurious Behavior No self-injurious ideation or behavior indicators observed or expressed   Danger to Others  Danger to Others None reported or observed

## 2023-03-26 NOTE — Progress Notes (Signed)
[facility] MD Progress Note  03/26/2023 1:57 PM Yolanda [name]  MRN:  [id number]  Principal Problem: Schizoaffective disorder, depressive type (HCC)  Diagnosis: Principal Problem:   Schizoaffective disorder, depressive type (HCC) Active Problems:   GERD (gastroesophageal reflux disease)   Intellectual disability   Tobacco use disorder  Reason for admission: This is the first psychiatric admission in this [facility] in 12 years for this 47 AA female with an extensive hx of mental illnesses & probable polysubstance use disorders. She is admitted to the [facility] from the [facility] hospital with complain of worsening suicidal ideations with plan to stab herself. Per chart review, patient apparently reported at the ED that she has been depressed for a while & has not been taking her mental health medications. After medical evaluation.clearance, she was transferred to the [facility] for further psychiatric evaluation/treatments.   24 hr chart review: Sleep Hours last night: Fair as per patient & good as per nsg reports Nursing Concerns: None noted. Behavioral episodes in the past 24 WUJ:[id number] Isolative to the room  Medication Compliance: Compliant  Vital Signs in the past 24 hrs: WNL PRN Medications in the past 24 hrs: Tylenol  Patient assessment note:  Pt with flat affect and depressed mood, attention to personal hygiene and grooming is poor, pt is remaining isolative to her room, is not tending to personal hygiene needs and is requiring multiple positive, reinforcements to take care of personal hygiene and to come out of room for unit activities. She verbalizes still being depressed due to still being hospitalized. Eye contact is good, speech is clear & coherent. Thought contents are organized and logical, and pt currently denies SI/HI/AVH or paranoia. There is no evidence of delusional thoughts.  She reports a fair sleep quality last night, states that she is tired of eating hospital food, did not eat breakfast,  but ate lunch.   Denies having side effects to current psychiatric medications.  We discussed keeping current medications same with no changes today.  Discussed the following psychosocial stressors: Continuous hospitalization.  Empathy and active listening provided.  Patient educated to get out of her room and engage in activities in the day room with her peers, and stop isolating herself in the room. Educated that it may become necessary to reinforce room lock outs if she does not start coming out of room on her own. Educated that we continue to await DSS for a safe placement for her in the community. Verbalizes understanding. Support provided.  Total Time spent with patient:  35 minutes  Past Psychiatric History:  See H & P  Past Medical History:  Past Medical History:  Diagnosis Date   Bipolar affect, depressed (HCC)    Constipation 08/17/2022   Depression    Falls 07/21/2022   Fracture of femoral neck, right, closed (HCC) 01/17/2022   Herpes simplex 08/22/2017   Open fracture dislocation of right elbow joint 01/17/2022    Past Surgical History:  Procedure Laterality Date   NO PAST SURGERIES     SALPINGECTOMY     Family History: History reviewed. No pertinent family history.  Family Psychiatric  History: See H & P  Social History:  Social History   Substance and Sexual Activity  Alcohol Use Yes     Social History   Substance and Sexual Activity  Drug Use Yes   Types: Cocaine, Marijuana    Social History   Socioeconomic History   Marital status: Single    Spouse name: Not on file  Number of children: Not on file   Years of education: Not on file   Highest education level: Not on file  Occupational History   Not on file  Tobacco Use   Smoking status: Every Day   Smokeless tobacco: Not on file  Substance and Sexual Activity   Alcohol use: Yes   Drug use: Yes    Types: Cocaine, Marijuana   Sexual activity: Yes  Other Topics Concern   Not on file  Social  History Narrative   Not on file   Social Determinants of Health   Financial Resource Strain: Low Risk  (09/12/2022)   Received from [facility]   Overall Financial Resource Strain (CARDIA)    Difficulty of Paying Living Expenses: Not hard at all  Food Insecurity: Patient Declined (12/20/2022)   Hunger Vital Sign    Worried About Running Out of Food in the Last Year: Patient declined    Ran Out of Food in the Last Year: Patient declined  Transportation Needs: No Transportation Needs (12/20/2022)   PRAPARE - Administrator, Civil Service (Medical): No    Lack of Transportation (Non-Medical): No  Physical Activity: Not on file  Stress: No Stress Concern Present (07/17/2022)   Received from [facility], [facility] - Occupational Stress Questionnaire    Feeling of Stress : Not at all  Social Connections: Unknown (07/16/2022)   Received from [facility], [facility]   Social Network    Social Network: Not on file   Sleep: Good  Appetite:  Good  Current Medications: Current Facility-Administered Medications  Medication Dose Route Frequency Provider Last Rate Last Admin   acetaminophen (TYLENOL) tablet 650 mg  650 mg Oral Q6H PRN [name], NP   650 mg at 03/24/23 1848   alum & mag hydroxide-simeth (MAALOX/MYLANTA) 200-200-20 MG/5ML suspension 30 mL  30 mL Oral Q4H PRN [name], NP   30 mL at 01/28/23 1530   busPIRone (BUSPAR) tablet 15 mg  15 mg Oral BID [name], NP   15 mg at 03/26/23 0827   cyanocobalamin (VITAMIN B12) injection 1,000 mcg  1,000 mcg Intramuscular Q30 days [name], Nadir, MD   1,000 mcg at 03/04/23 1421   diphenhydrAMINE (BENADRYL) capsule 50 mg  50 mg Oral TID PRN [name], NP   50 mg at 01/15/23 1211   Or   diphenhydrAMINE (BENADRYL) injection 50 mg  50 mg Intramuscular TID PRN [name], NP       feeding supplement (ENSURE ENLIVE / ENSURE PLUS) liquid 237 mL  237 mL  Oral TID BM Vila Dory, NP   237 mL at 03/26/23 0956   haloperidol (HALDOL) tablet 5 mg  5 mg Oral TID PRN [name], NP   5 mg at 01/15/23 1211   Or   haloperidol lactate (HALDOL) injection 5 mg  5 mg Intramuscular TID PRN [name], NP       hydrOXYzine (ATARAX) tablet 25 mg  25 mg Oral TID PRN [name], DO   25 mg at 03/22/23 1951   LORazepam (ATIVAN) tablet 2 mg  2 mg Oral TID PRN [name], NP   2 mg at 01/15/23 1211   Or   LORazepam (ATIVAN) injection 2 mg  2 mg Intramuscular TID PRN [name], NP       magnesium hydroxide (MILK OF MAGNESIA) suspension 30 mL  30 mL Oral Daily PRN [name],  NP   30 mL at 03/22/23 2112   melatonin tablet 5 mg  5 mg Oral QHS Sukhdeep Wieting, NP   5 mg at 03/25/23 2114   nicotine (NICODERM CQ - dosed in mg/24 hours) patch 21 mg  21 mg Transdermal Daily [name], DO   21 mg at 03/25/23 7253   nicotine polacrilex (NICORETTE) gum 2 mg  2 mg Oral PRN [name], MD   2 mg at 03/22/23 1951   paliperidone (INVEGA) 24 hr tablet 6 mg  6 mg Oral Daily [name], NP   6 mg at 03/25/23 2114   pantoprazole (PROTONIX) EC tablet 40 mg  40 mg Oral Daily [name], NP   40 mg at 03/26/23 0827   polyethylene glycol (MIRALAX / GLYCOLAX) packet 17 g  17 g Oral Daily PRN [name], NP   17 g at 03/20/23 1744   sertraline (ZOLOFT) tablet 150 mg  150 mg Oral Daily Massengill, Harrold Donath, MD   150 mg at 03/26/23 0827   SUMAtriptan (IMITREX) tablet 25 mg  25 mg Oral BID PRN [name], NP   25 mg at 03/20/23 1603   traZODone (DESYREL) tablet 50 mg  50 mg Oral QHS [name], NP   50 mg at 03/25/23 2114   Vitamin D (Ergocalciferol) (DRISDOL) 1.25 MG (50000 UNIT) capsule 50,000 Units  50,000 Units Oral Q7 days [name], NP   50,000 Units at 03/23/23 1823   Lab Results:  No results found for this or any previous visit (from the past 48 hour(s)).  Blood Alcohol level:  Lab Results  Component Value Date   ETH <10  12/19/2022   ETH <10 11/21/2019   Metabolic Disorder Labs: Lab Results  Component Value Date   HGBA1C 4.4 (L) 12/21/2022   MPG 79.58 12/21/2022   No results found for: "PROLACTIN" Lab Results  Component Value Date   CHOL 218 (H) 12/21/2022   TRIG 81 12/21/2022   HDL 53 12/21/2022   CHOLHDL 4.1 12/21/2022   VLDL 16 12/21/2022   LDLCALC 149 (H) 12/21/2022   LDLCALC 110 (H) 03/13/2011   Physical Findings: AIMS: Facial and Oral Movements Muscles of Facial Expression: None, normal Lips and Perioral Area: None, normal Jaw: None, normal Tongue: None, normal,Extremity Movements Upper (arms, wrists, hands, fingers): None, normal Lower (legs, knees, ankles, toes): None, normal, Trunk Movements Neck, shoulders, hips: None, normal, Overall Severity Severity of abnormal movements (highest score from questions above): None, normal Incapacitation due to abnormal movements: None, normal Patient's awareness of abnormal movements (rate only patient's report): No Awareness, Dental Status Current problems with teeth and/or dentures?: No Does patient usually wear dentures?: No  CIWA:    COWS:    AIMS:0 Musculoskeletal: Strength & Muscle Tone: within normal limits Gait & Station: normal Patient leans: N/A  Psychiatric Specialty Exam:  Presentation  General Appearance:  Appropriate for Environment; Casual  Eye Contact: Limited to baseline Speech: Decreased amount, decreased tone and volume but at baseline Speech Volume: Normal  Handedness: Right  Mood and Affect  Mood: Sad and depressed mainly secondary to being in the hospital with no place to go Affect: Congruent  Thought Process  Thought Processes: Coherent  Descriptions of Associations:Intact  Orientation:Partial  Thought Content:Logical [place]crete History of Schizophrenia/Schizoaffective disorder:Yes  Duration of Psychotic Symptoms:Greater than six months  Hallucinations:Hallucinations: None  Denies AVH,  does not appear responding to stimuli  Ideas of Reference: None  Suicidal Thoughts:Suicidal Thoughts: No  Denies passive or active  SI intention or plan   Homicidal Thoughts:Homicidal Thoughts: No  Denies HI  Sensorium  Memory: Immediate Good  Judgment: Fair  Insight: Fair  Chartered certified accountant: Fair  Attention Span: Fair  Recall: Fair  Fund of Knowledge: Fair  Language: Fair  Psychomotor Activity  Psychomotor Activity: Psychomotor Activity: Normal    Assets  Assets: Resilience  Sleep  Sleep: Sleep: Fair    Physical Exam: Physical Exam Vitals and nursing note reviewed.  Genitourinary:    Comments: Deferred Neurological:     General: No focal deficit present.     Mental Status: She is oriented to person, place, and time.  Psychiatric:        Mood and Affect: Mood normal.        Behavior: Behavior normal.        Thought Content: Thought content normal.    Review of Systems  Constitutional:  Negative for fever.  HENT:  Negative for hearing loss.   Eyes:  Negative for blurred vision.  Respiratory:  Negative for cough.   Cardiovascular:  Negative for chest pain.  Gastrointestinal:  Negative for heartburn.  Genitourinary:  Negative for dysuria.  Musculoskeletal:  Negative for myalgias.  Skin:  Negative for rash.  Neurological:  Negative for dizziness.  Psychiatric/Behavioral:  Positive for depression. Negative for hallucinations, memory loss, substance abuse and suicidal ideas. The patient is nervous/anxious and has insomnia.   All other systems reviewed and are negative.  Blood pressure 122/84, pulse 72, temperature 98.8 F (37.1 C), temperature source Oral, resp. rate 16, height 4\' 11"  (1.499 m), weight 63 kg, SpO2 99%. Body mass index is 28.05 kg/m.  Treatment Plan Summary: Daily contact with patient to assess and evaluate symptoms and progress in treatment and Medication management.    Still waiting on safe  disposition as patient would not be able to live independently given cognitive impairment. Recommend assisted living.    No medication side effects reported.  Because of urinary incontinence, recommended adult diapers to be worn.   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  Plan:  -Continue Imitrex 25 mg PRN BID for migraines -Continue Colace 100 mg daily for constipation -Continue Vitamin D 50.000 units weekly for bone health. -Continue Sertraline150 mg po Q daily for depression/anxiety -Continue Buspar 15 mg po bid for anxiety.  -Continue paliperidone 6 mg po qd for mood stabilization -Continue Trazodone 50 mg nightly for insomnia  -Continue Nicoderm 21 mg topically Q 24 hrs for nicotine withdrawal management. -Continue vitamin B12 1000 mcg IM weekly for 4 weeks then monthly -Continue Melatonin 5 mg nightly for sleep -Continue Protonix EC 40 mg p.o. daily for GERD -Continue MiraLAX 17 g p.o. PRN for constipation -Continue hydroxyzine 25 mg p.o. 3 times daily as needed for anxiety -Continue Ensure nutritional shakes TID in between meals  -Previously discontinued Hydroxyzine 50 mg nightly-hypotension in the mornings   Safety and Monitoring: Voluntary admission to inpatient psychiatric unit for safety, stabilization and treatment Daily contact with patient to assess and evaluate symptoms and progress in treatment Patient's case to be discussed in multi-disciplinary team meeting Observation Level : q15 minute checks Vital signs: q12 hours Precautions: Safety   Discharge Planning: Social work and case management to assist with discharge planning and identification of hospital follow-up needs prior to discharge Estimated LOS: Unknown at this  time. Discharge Concerns:  Need to establish a safety plan; Medication compliance and effectiveness Discharge Goals: Return home with outpatient referrals for mental health follow-up including medication management/psychotherapy No legal guardian, has payee  [name], NP,  03/26/2023, 1:57 PM Patient ID: Yolanda [name], female   DOB: 05/19/1976, 47 y.o.   MRN: [id number]

## 2023-03-26 NOTE — Group Note (Signed)
Date:  03/26/2023 Time:  2:28 AM  Group Topic/Focus:  Wrap-Up Group:   The focus of this group is to help patients review their daily goal of treatment and discuss progress on daily workbooks.    Participation Level:  Active  Participation Quality:  Appropriate and Sharing  Affect:  Appropriate  Cognitive:  Appropriate  Insight: Appropriate  Engagement in Group:  Engaged  Modes of Intervention:  Activity and Socialization  Additional Comments:  The patient rated her day a 4/10. The patient shared that she had a good day today; the patient stated that she was able to go outside and spend time with others on the unit which made it a good day. The patient participated in the group ice breaker/riddle activity after sharing.   Kennieth Francois 03/26/2023, 2:28 AM

## 2023-03-26 NOTE — [facility] Group Notes (Signed)
[facility] Group Notes:  (Nursing/MHT/Case Management/Adjunct)  Date:  03/26/2023  Time:  8:58 PM  Type of Therapy:   Wrap Up Group  Participation Level:  Active  Participation Quality:  Appropriate and Attentive  Affect:  Appropriate  Cognitive:  Appropriate and Oriented  Insight:  Appropriate, Good, and Improving  Engagement in Group:  Engaged and Improving  Modes of Intervention:  Discussion  Summary of Progress/Problems: Patient participated appropriately in group. Patient was happy that she had visitation today. Patient states she wants  to attempt to attend more groups tomorrow.  [name] 03/26/2023, 8:58 PM

## 2023-03-27 ENCOUNTER — Encounter (HOSPITAL_COMMUNITY): Payer: Self-pay

## 2023-03-27 DIAGNOSIS — F251 Schizoaffective disorder, depressive type: Secondary | ICD-10-CM | POA: Diagnosis not present

## 2023-03-27 NOTE — Progress Notes (Signed)
   03/27/23 2324  Psych Admission Type (Psych Patients Only)  Admission Status Involuntary  Psychosocial Assessment  Patient Complaints None  Eye Contact Fair  Facial Expression Flat  Affect Appropriate to circumstance  Speech Slow  Interaction Minimal  Motor Activity Slow;Tremors  Appearance/Hygiene Unremarkable  Behavior Characteristics Cooperative;Appropriate to situation  Mood Pleasant  Thought Process  Coherency WDL  Content WDL  Delusions None reported or observed  Perception WDL  Hallucination None reported or observed  Judgment Poor  Confusion None  Danger to Self  Current suicidal ideation? Denies  Self-Injurious Behavior No self-injurious ideation or behavior indicators observed or expressed   Agreement Not to Harm Self Yes  Description of Agreement verbal  Danger to Others  Danger to Others None reported or observed

## 2023-03-27 NOTE — Progress Notes (Signed)
   03/26/23 2328  Psych Admission Type (Psych Patients Only)  Admission Status Involuntary  Psychosocial Assessment  Patient Complaints None  Eye Contact Fair  Facial Expression Animated  Affect Appropriate to circumstance  Speech Logical/coherent  Interaction Assertive  Motor Activity Slow  Appearance/Hygiene Unremarkable  Behavior Characteristics Cooperative;Appropriate to situation  Mood Pleasant  Thought Process  Coherency WDL  Content WDL  Delusions None reported or observed  Perception WDL  Hallucination None reported or observed  Judgment Poor  Confusion None  Danger to Self  Current suicidal ideation? Denies  Self-Injurious Behavior No self-injurious ideation or behavior indicators observed or expressed   Agreement Not to Harm Self Yes  Description of Agreement verbal

## 2023-03-27 NOTE — [facility] Group Notes (Signed)
[facility] Group Notes:  (Nursing/MHT/Case Management/Adjunct)  Date:  03/27/2023  Time:  8:35 PM  Type of Therapy:   Wrap Up Group  Participation Level:  Did Not Attend   Kenna Seward 03/27/2023, 8:35 PM

## 2023-03-27 NOTE — Group Note (Signed)
Recreation Therapy Group Note   Group Topic:Leisure Education  Group Date: 03/27/2023 Start Time: 0935 End Time: 1025 Facilitators: Lijah Bourque-McCall, LRT,CTRS Location: [address] Dayroom   Group Topic: Leisure Education   Goal Area(s) Addresses:  Patient will successfully identify positive leisure and recreation activities.  Patient will acknowledge benefits of participation in healthy leisure activities post discharge.  Patient will actively work with peers toward a shared goal.   Intervention: Group Game    Group Description: Music Trivia. LRT asked patients music questions about oldies but goodies and 90s, 2000s hip hop and r&b. In one group, patients worked together to figure out the next lyric to the songs presented in group. The activity was also used to show patients that leisure didn't have to be an expensive activity and can be enjoyed as a group as well as individually.    Education:  Teacher, English as a foreign language, Leisure as Merchant navy officer, Programmer, applications, Building control surveyor   Education Outcome: Acknowledges education/In group clarification offered/Needs additional education   Affect/Mood: N/A   Participation Level: Did not attend    Clinical Observations/Individualized Feedback:     Plan: Continue to engage patient in RT group sessions 2-3x/week.   Yolanda [name], LRT,CTRS  03/27/2023 12:02 PM

## 2023-03-27 NOTE — BH IP Treatment Plan (Signed)
Interdisciplinary Treatment and Diagnostic Plan Update  03/27/2023 Time of Session: 1230 Yolanda [name] MRN: [id number]  Principal Diagnosis: Schizoaffective disorder, depressive type (HCC)  Secondary Diagnoses: Principal Problem:   Schizoaffective disorder, depressive type (HCC) Active Problems:   GERD (gastroesophageal reflux disease)   Intellectual disability   Tobacco use disorder   Current Medications:  Current Facility-Administered Medications  Medication Dose Route Frequency Provider Last Rate Last Admin   acetaminophen (TYLENOL) tablet 650 mg  650 mg Oral Q6H PRN [name], NP   650 mg at 03/24/23 1848   alum & mag hydroxide-simeth (MAALOX/MYLANTA) 200-200-20 MG/5ML suspension 30 mL  30 mL Oral Q4H PRN [name], NP   30 mL at 01/28/23 1530   busPIRone (BUSPAR) tablet 15 mg  15 mg Oral BID [name], NP   15 mg at 03/27/23 0901   cyanocobalamin (VITAMIN B12) injection 1,000 mcg  1,000 mcg Intramuscular Q30 days [name], Nadir, MD   1,000 mcg at 03/04/23 1421   diphenhydrAMINE (BENADRYL) capsule 50 mg  50 mg Oral TID PRN [name], NP   50 mg at 01/15/23 1211   Or   diphenhydrAMINE (BENADRYL) injection 50 mg  50 mg Intramuscular TID PRN [name], NP       feeding supplement (ENSURE ENLIVE / ENSURE PLUS) liquid 237 mL  237 mL Oral TID BM Nkwenti, Doris, NP   237 mL at 03/27/23 1026   haloperidol (HALDOL) tablet 5 mg  5 mg Oral TID PRN [name], NP   5 mg at 01/15/23 1211   Or   haloperidol lactate (HALDOL) injection 5 mg  5 mg Intramuscular TID PRN [name], NP       hydrOXYzine (ATARAX) tablet 25 mg  25 mg Oral TID PRN [name], DO   25 mg at 03/22/23 1951   LORazepam (ATIVAN) tablet 2 mg  2 mg Oral TID PRN [name], NP   2 mg at 01/15/23 1211   Or   LORazepam (ATIVAN) injection 2 mg  2 mg Intramuscular TID PRN [name], NP       magnesium hydroxide (MILK OF MAGNESIA) suspension 30 mL  30 mL Oral Daily PRN [name], NP    30 mL at 03/22/23 2112   melatonin tablet 5 mg  5 mg Oral QHS Nkwenti, Doris, NP   5 mg at 03/26/23 2117   nicotine (NICODERM CQ - dosed in mg/24 hours) patch 21 mg  21 mg Transdermal Daily [name], DO   21 mg at 03/25/23 3244   nicotine polacrilex (NICORETTE) gum 2 mg  2 mg Oral PRN [name], MD   2 mg at 03/22/23 1951   paliperidone (INVEGA) 24 hr tablet 6 mg  6 mg Oral Daily [name], NP   6 mg at 03/26/23 2119   pantoprazole (PROTONIX) EC tablet 40 mg  40 mg Oral Daily [name], NP   40 mg at 03/27/23 0901   polyethylene glycol (MIRALAX / GLYCOLAX) packet 17 g  17 g Oral Daily PRN [name], NP   17 g at 03/20/23 1744   sertraline (ZOLOFT) tablet 150 mg  150 mg Oral Daily Massengill, Harrold Donath, MD   150 mg at 03/27/23 0901   SUMAtriptan (IMITREX) tablet 25 mg  25 mg Oral BID PRN [name], NP   25 mg at 03/20/23 1603   traZODone (DESYREL) tablet 50 mg  50 mg Oral QHS [name], NP   50 mg at  03/26/23 2118   Vitamin D (Ergocalciferol) (DRISDOL) 1.25 MG (50000 UNIT) capsule 50,000 Units  50,000 Units Oral Q7 days [name], NP   50,000 Units at 03/23/23 1823   PTA Medications: Medications Prior to Admission  Medication Sig Dispense Refill Last Dose   busPIRone (BUSPAR) 15 MG tablet Take 15 mg by mouth 2 (two) times daily. (Patient not taking: Reported on 12/19/2022)      paliperidone (INVEGA SUSTENNA) 156 MG/ML SUSY injection Inject 156 mg into the muscle once. (Patient not taking: Reported on 12/19/2022)      sertraline (ZOLOFT) 50 MG tablet Take 150 mg by mouth daily. (Patient not taking: Reported on 12/19/2022)      traZODone (DESYREL) 100 MG tablet Take 100 mg by mouth at bedtime as needed for sleep. (Patient not taking: Reported on 11/12/2022)       Patient Stressors: Medication change or noncompliance    Patient Strengths: Forensic psychologist fund of knowledge   Treatment Modalities: Medication Management, Group therapy, Case  management,  1 to 1 session with clinician, Psychoeducation, Recreational therapy.   Physician Treatment Plan for Primary Diagnosis: Schizoaffective disorder, depressive type (HCC) Long Term Goal(s): Improvement in symptoms so as ready for discharge   Short Term Goals: Ability to identify and develop effective coping behaviors will improve Ability to maintain clinical measurements within normal limits will improve Compliance with prescribed medications will improve Ability to identify triggers associated with substance abuse/mental health issues will improve Ability to identify changes in lifestyle to reduce recurrence of condition will improve Ability to verbalize feelings will improve Ability to disclose and discuss suicidal ideas Ability to demonstrate self-control will improve  Medication Management: Evaluate patient's response, side effects, and tolerance of medication regimen.  Therapeutic Interventions: 1 to 1 sessions, Unit Group sessions and Medication administration.  Evaluation of Outcomes: Progressing  Physician Treatment Plan for Secondary Diagnosis: Principal Problem:   Schizoaffective disorder, depressive type (HCC) Active Problems:   GERD (gastroesophageal reflux disease)   Intellectual disability   Tobacco use disorder  Long Term Goal(s): Improvement in symptoms so as ready for discharge   Short Term Goals: Ability to identify and develop effective coping behaviors will improve Ability to maintain clinical measurements within normal limits will improve Compliance with prescribed medications will improve Ability to identify triggers associated with substance abuse/mental health issues will improve Ability to identify changes in lifestyle to reduce recurrence of condition will improve Ability to verbalize feelings will improve Ability to disclose and discuss suicidal ideas Ability to demonstrate self-control will improve     Medication Management: Evaluate  patient's response, side effects, and tolerance of medication regimen.  Therapeutic Interventions: 1 to 1 sessions, Unit Group sessions and Medication administration.  Evaluation of Outcomes: Progressing   RN Treatment Plan for Primary Diagnosis: Schizoaffective disorder, depressive type (HCC) Long Term Goal(s): Knowledge of disease and therapeutic regimen to maintain health will improve  Short Term Goals: Ability to remain free from injury will improve, Ability to verbalize frustration and anger appropriately will improve, Ability to demonstrate self-control, Ability to participate in decision making will improve, Ability to verbalize feelings will improve, Ability to disclose and discuss suicidal ideas, Ability to identify and develop effective coping behaviors will improve, and Compliance with prescribed medications will improve  Medication Management: RN will administer medications as ordered by provider, will assess and evaluate patient's response and provide education to patient for prescribed medication. RN will report any adverse and/or side effects to prescribing provider.  Therapeutic  Interventions: 1 on 1 counseling sessions, Psychoeducation, Medication administration, Evaluate responses to treatment, Monitor vital signs and CBGs as ordered, Perform/monitor CIWA, COWS, AIMS and Fall Risk screenings as ordered, Perform wound care treatments as ordered.  Evaluation of Outcomes: Progressing   LCSW Treatment Plan for Primary Diagnosis: Schizoaffective disorder, depressive type (HCC) Long Term Goal(s): Safe transition to appropriate next level of care at discharge, Engage patient in therapeutic group addressing interpersonal concerns.  Short Term Goals: Engage patient in aftercare planning with referrals and resources, Increase social support, Increase ability to appropriately verbalize feelings, Increase emotional regulation, Facilitate acceptance of mental health diagnosis and concerns,  Facilitate patient progression through stages of change regarding substance use diagnoses and concerns, Identify triggers associated with mental health/substance abuse issues, and Increase skills for wellness and recovery  Therapeutic Interventions: Assess for all discharge needs, 1 to 1 time with Social worker, Explore available resources and support systems, Assess for adequacy in community support network, Educate family and significant other(s) on suicide prevention, Complete Psychosocial Assessment, Interpersonal group therapy.  Evaluation of Outcomes: Progressing   Progress in Treatment: Attending groups: Yes. Participating in groups: Yes. Taking medication as prescribed: Yes. Toleration medication: Yes. Family/Significant other contact made: Yes, individual(s) contacted:  Tamera Reason (Mother) Patient understands diagnosis: Yes. Discussing patient identified problems/goals with staff: Yes. Medical problems stabilized or resolved: Yes. Denies suicidal/homicidal ideation: Yes. Issues/concerns per patient self-inventory: Yes. Other: N/A  New problem(s) identified: No, Describe:  None reported  New Short Term/Long Term Goal(s): medication stabilization, elimination of SI thoughts, development of comprehensive mental wellness plan.   Patient Goals:  Coping Skills/Housing  Discharge Plan or Barriers: CSW will continue to follow and assess for appropriate referrals and possible discharge planning.   Reason for Continuation of Hospitalization: Depression Medication stabilization Suicidal ideation  Estimated Length of Stay: 3-7 Days  Last 3 [place] Suicide Severity Risk Score: Flowsheet Row Admission (Current) [facility] from 12/19/2022 in [facility] ED from 11/12/2022 in [facility]  C-SSRS RISK CATEGORY High Risk High Risk Moderate Risk        Last [facility] 2/9 Scores:     No data to display          medication stabilization, elimination of SI thoughts, development of comprehensive mental wellness plan.    Scribe for Treatment Team: Ane Payment, LCSW 03/27/2023 1:30 PM

## 2023-03-27 NOTE — Plan of Care (Signed)
Problem: Education: Goal: Utilization of techniques to improve thought processes will improve Outcome: Progressing   Problem: Activity: Goal: Interest or engagement in leisure activities will improve Outcome: Progressing   Problem: Coping: Goal: Coping ability will improve Outcome: Progressing

## 2023-03-27 NOTE — Plan of Care (Signed)
  Problem: Education: Goal: Knowledge of the prescribed therapeutic regimen will improve Outcome: Progressing   Problem: Activity: Goal: Interest or engagement in leisure activities will improve Outcome: Progressing Goal: Imbalance in normal sleep/wake cycle will improve Outcome: Progressing   Problem: Coping: Goal: Coping ability will improve Outcome: Progressing Goal: Will verbalize feelings Outcome: Progressing   Problem: Health Behavior/Discharge Planning: Goal: Ability to make decisions will improve Outcome: Progressing Goal: Compliance with therapeutic regimen will improve Outcome: Progressing   Problem: Role Relationship: Goal: Will demonstrate positive changes in social behaviors and relationships Outcome: Progressing   Problem: Safety: Goal: Ability to disclose and discuss suicidal ideas will improve Outcome: Progressing

## 2023-03-27 NOTE — Progress Notes (Signed)
[facility] MD Progress Note  03/27/2023 2:32 PM Yolanda [name]  MRN:  [id number]  Principal Problem: Schizoaffective disorder, depressive type (HCC)  Diagnosis: Principal Problem:   Schizoaffective disorder, depressive type (HCC) Active Problems:   GERD (gastroesophageal reflux disease)   Intellectual disability   Tobacco use disorder  Reason for admission: This is the first psychiatric admission in this [facility] in 12 years for this 1 AA female with an extensive hx of mental illnesses & probable polysubstance use disorders. She is admitted to the [facility] from the [facility] hospital with complain of worsening suicidal ideations with plan to stab herself. Per chart review, patient apparently reported at the ED that she has been depressed for a while & has not been taking her mental health medications. After medical evaluation.clearance, she was transferred to the [facility] for further psychiatric evaluation/treatments.   24 hr chart review: Sleep Hours last night: Fair as per patient & good as per nsg reports Nursing Concerns: None noted. Behavioral episodes in the past 24 EXB:[id number] Isolative to the room  Medication Compliance: Compliant  Vital Signs in the past 24 hrs: WNL PRN Medications in the past 24 hrs: Tylenol  Patient assessment note:  During today's encounter, pt is seen in her room while lying in bed with eyes closed, and barely audible until we are talking about the visit with her daughter last night.  Patient stopped smiling then, talks about how she enjoyed the visit, talks about this being the second time that her daughter has visited, and Clinical research associate provided empathy, active listening, and educated her about the fact that her family has not abandoned her like she had previously indicated.  Patient agreeable.  Positive reinforcements given for the patient to get out of her room, go to the day room, and participated in group activities.  Patient also educated to take a shower and take care of other  personal hygiene needs.  Patient denies SI/HI/AVH.  She denies paranoia, and there is no evidence of delusional thinking today.  She verbalizes feeling safe on the unit.  Denies being in any physical pain today.  Reports that she is still tolerating medications, with no medication related side effects. We discussed keeping current medications same with no changes today.  Discussed the following psychosocial stressors: Continuous hospitalization.  Support provided, educated on the fact that we will be holding another meeting with herself included on the upcoming Friday, 9/20 with DSS regarding a safe placement for her in the community.  Patient verbalized understanding.  Total Time spent with patient:  35 minutes  Past Psychiatric History:  See H & P  Past Medical History:  Past Medical History:  Diagnosis Date   Bipolar affect, depressed (HCC)    Constipation 08/17/2022   Depression    Falls 07/21/2022   Fracture of femoral neck, right, closed (HCC) 01/17/2022   Herpes simplex 08/22/2017   Open fracture dislocation of right elbow joint 01/17/2022    Past Surgical History:  Procedure Laterality Date   NO PAST SURGERIES     SALPINGECTOMY     Family History: History reviewed. No pertinent family history.  Family Psychiatric  History: See H & P  Social History:  Social History   Substance and Sexual Activity  Alcohol Use Yes     Social History   Substance and Sexual Activity  Drug Use Yes   Types: Cocaine, Marijuana    Social History   Socioeconomic History   Marital status: Single    Spouse name:  Not on file   Number of children: Not on file   Years of education: Not on file   Highest education level: Not on file  Occupational History   Not on file  Tobacco Use   Smoking status: Every Day   Smokeless tobacco: Not on file  Substance and Sexual Activity   Alcohol use: Yes   Drug use: Yes    Types: Cocaine, Marijuana   Sexual activity: Yes  Other Topics Concern    Not on file  Social History Narrative   Not on file   Social Determinants of Health   Financial Resource Strain: Low Risk  (09/12/2022)   Received from [facility], [facility]   Overall Financial Resource Strain (CARDIA)    Difficulty of Paying Living Expenses: Not hard at all  Food Insecurity: Patient Declined (12/20/2022)   Hunger Vital Sign    Worried About Running Out of Food in the Last Year: Patient declined    Ran Out of Food in the Last Year: Patient declined  Transportation Needs: No Transportation Needs (12/20/2022)   PRAPARE - Administrator, Civil Service (Medical): No    Lack of Transportation (Non-Medical): No  Physical Activity: Not on file  Stress: No Stress Concern Present (07/17/2022)   Received from [facility], [facility] - Occupational Stress Questionnaire    Feeling of Stress : Not at all  Social Connections: Unknown (07/16/2022)   Received from [facility], [facility]   Social Network    Social Network: Not on file   Sleep: Good  Appetite:  Good  Current Medications: Current Facility-Administered Medications  Medication Dose Route Frequency Provider Last Rate Last Admin   acetaminophen (TYLENOL) tablet 650 mg  650 mg Oral Q6H PRN [name], NP   650 mg at 03/24/23 1848   alum & mag hydroxide-simeth (MAALOX/MYLANTA) 200-200-20 MG/5ML suspension 30 mL  30 mL Oral Q4H PRN [name], NP   30 mL at 01/28/23 1530   busPIRone (BUSPAR) tablet 15 mg  15 mg Oral BID [name], NP   15 mg at 03/27/23 0901   cyanocobalamin (VITAMIN B12) injection 1,000 mcg  1,000 mcg Intramuscular Q30 days [name], Nadir, MD   1,000 mcg at 03/04/23 1421   diphenhydrAMINE (BENADRYL) capsule 50 mg  50 mg Oral TID PRN [name], NP   50 mg at 01/15/23 1211   Or   diphenhydrAMINE (BENADRYL) injection 50 mg  50 mg Intramuscular TID PRN [name], NP       feeding supplement (ENSURE ENLIVE / ENSURE PLUS)  liquid 237 mL  237 mL Oral TID BM Asya Derryberry, NP   237 mL at 03/27/23 1026   haloperidol (HALDOL) tablet 5 mg  5 mg Oral TID PRN [name], NP   5 mg at 01/15/23 1211   Or   haloperidol lactate (HALDOL) injection 5 mg  5 mg Intramuscular TID PRN [name], NP       hydrOXYzine (ATARAX) tablet 25 mg  25 mg Oral TID PRN [name], DO   25 mg at 03/22/23 1951   LORazepam (ATIVAN) tablet 2 mg  2 mg Oral TID PRN [name], NP   2 mg at 01/15/23 1211   Or   LORazepam (ATIVAN) injection 2 mg  2 mg Intramuscular TID PRN [name], NP       magnesium hydroxide (MILK OF MAGNESIA) suspension 30 mL  30 mL  Oral Daily PRN [name], NP   30 mL at 03/22/23 2112   melatonin tablet 5 mg  5 mg Oral QHS Joselinne Lawal, NP   5 mg at 03/26/23 2117   nicotine (NICODERM CQ - dosed in mg/24 hours) patch 21 mg  21 mg Transdermal Daily [name], DO   21 mg at 03/25/23 9562   nicotine polacrilex (NICORETTE) gum 2 mg  2 mg Oral PRN [name], MD   2 mg at 03/22/23 1951   paliperidone (INVEGA) 24 hr tablet 6 mg  6 mg Oral Daily [name], NP   6 mg at 03/26/23 2119   pantoprazole (PROTONIX) EC tablet 40 mg  40 mg Oral Daily [name], NP   40 mg at 03/27/23 0901   polyethylene glycol (MIRALAX / GLYCOLAX) packet 17 g  17 g Oral Daily PRN [name], NP   17 g at 03/20/23 1744   sertraline (ZOLOFT) tablet 150 mg  150 mg Oral Daily Massengill, Harrold Donath, MD   150 mg at 03/27/23 0901   SUMAtriptan (IMITREX) tablet 25 mg  25 mg Oral BID PRN [name], NP   25 mg at 03/20/23 1603   traZODone (DESYREL) tablet 50 mg  50 mg Oral QHS [name], NP   50 mg at 03/26/23 2118   Vitamin D (Ergocalciferol) (DRISDOL) 1.25 MG (50000 UNIT) capsule 50,000 Units  50,000 Units Oral Q7 days [name], NP   50,000 Units at 03/23/23 1823   Lab Results:  No results found for this or any previous visit (from the past 48 hour(s)).  Blood Alcohol level:  Lab Results  Component  Value Date   ETH <10 12/19/2022   ETH <10 11/21/2019   Metabolic Disorder Labs: Lab Results  Component Value Date   HGBA1C 4.4 (L) 12/21/2022   MPG 79.58 12/21/2022   No results found for: "PROLACTIN" Lab Results  Component Value Date   CHOL 218 (H) 12/21/2022   TRIG 81 12/21/2022   HDL 53 12/21/2022   CHOLHDL 4.1 12/21/2022   VLDL 16 12/21/2022   LDLCALC 149 (H) 12/21/2022   LDLCALC 110 (H) 03/13/2011   Physical Findings: AIMS: Facial and Oral Movements Muscles of Facial Expression: None, normal Lips and Perioral Area: None, normal Jaw: None, normal Tongue: None, normal,Extremity Movements Upper (arms, wrists, hands, fingers): None, normal Lower (legs, knees, ankles, toes): None, normal, Trunk Movements Neck, shoulders, hips: None, normal, Overall Severity Severity of abnormal movements (highest score from questions above): None, normal Incapacitation due to abnormal movements: None, normal Patient's awareness of abnormal movements (rate only patient's report): No Awareness, Dental Status Current problems with teeth and/or dentures?: No Does patient usually wear dentures?: No  CIWA:    COWS:    AIMS:0 Musculoskeletal: Strength & Muscle Tone: within normal limits Gait & Station: normal Patient leans: N/A  Psychiatric Specialty Exam:  Presentation  General Appearance:  Appropriate for Environment  Eye Contact: Limited to baseline Speech: Decreased amount, decreased tone and volume but at baseline Speech Volume: Normal  Handedness: Right  Mood and Affect  Mood: Sad and depressed mainly secondary to being in the hospital with no place to go Affect: Congruent  Thought Process  Thought Processes: Coherent  Descriptions of Associations:Intact  Orientation:Partial  Thought Content:Logical [place]crete History of Schizophrenia/Schizoaffective disorder:Yes  Duration of Psychotic Symptoms:Greater than six months  Hallucinations:Hallucinations:  None  Denies AVH, does not appear responding to stimuli  Ideas of Reference: None  Suicidal Thoughts:Suicidal Thoughts: No  Denies passive or active SI intention or plan   Homicidal Thoughts:Homicidal Thoughts: No  Denies HI  Sensorium  Memory: Immediate Good  Judgment: Fair  Insight: Fair  Chartered certified accountant: Fair  Attention Span: Fair  Recall: [employer] of Knowledge: Fair  Language: Fair  Psychomotor Activity  Psychomotor Activity: Psychomotor Activity: Normal    Assets  Assets: Resilience  Sleep  Sleep: Sleep: Fair    Physical Exam: Physical Exam Vitals and nursing note reviewed.  HENT:     Head: Normocephalic.  Eyes:     Pupils: Pupils are equal, round, and reactive to light.  Genitourinary:    Comments: Deferred Neurological:     General: No focal deficit present.     Mental Status: She is oriented to person, place, and time.  Psychiatric:        Mood and Affect: Mood normal.        Behavior: Behavior normal.        Thought Content: Thought content normal.    Review of Systems  Constitutional:  Negative for fever.  HENT:  Negative for hearing loss.   Eyes:  Negative for blurred vision.  Respiratory:  Negative for cough.   Cardiovascular:  Negative for chest pain.  Gastrointestinal:  Negative for heartburn.  Genitourinary:  Negative for dysuria.  Musculoskeletal:  Negative for myalgias.  Skin:  Negative for rash.  Neurological:  Negative for dizziness.  Psychiatric/Behavioral:  Positive for depression. Negative for hallucinations, memory loss, substance abuse and suicidal ideas. The patient is nervous/anxious and has insomnia.   All other systems reviewed and are negative.  Blood pressure 103/80, pulse 85, temperature 98.2 F (36.8 C), temperature source Oral, resp. rate 18, height 4\' 11"  (1.499 m), weight 63 kg, SpO2 98%. Body mass index is 28.05 kg/m.  Treatment Plan Summary: Daily contact with patient  to assess and evaluate symptoms and progress in treatment and Medication management.    Still waiting on safe disposition as patient would not be able to live independently given cognitive impairment. Recommend assisted living.    No medication side effects reported.  Because of urinary incontinence, recommended adult diapers to be worn.   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  Plan:  -Continue Imitrex 25 mg PRN BID for migraines -Continue Colace 100 mg daily for constipation -Continue Vitamin D 50.000 units weekly for bone health. -Continue Sertraline150 mg po Q daily for depression/anxiety -Continue Buspar 15 mg po bid for anxiety.  -Continue paliperidone 6 mg po qd for mood stabilization -Continue Trazodone 50 mg nightly for insomnia  -Continue Nicoderm 21 mg topically Q 24 hrs for nicotine withdrawal management. -Continue vitamin B12 1000 mcg IM weekly for 4 weeks then monthly -Continue Melatonin 5 mg nightly for sleep -Continue Protonix EC 40 mg p.o. daily for GERD -Continue MiraLAX 17 g p.o. PRN for constipation -Continue hydroxyzine 25 mg p.o. 3 times daily as needed for anxiety -Continue Ensure nutritional shakes TID in between meals  -Previously discontinued Hydroxyzine 50 mg nightly-hypotension in the mornings   Safety and Monitoring: Voluntary admission to inpatient psychiatric unit for safety, stabilization and treatment Daily contact with patient to assess and evaluate symptoms and progress in treatment Patient's case to be discussed in multi-disciplinary team meeting Observation Level : q15 minute checks Vital signs: q12 hours Precautions: Safety   Discharge Planning: Social  work and case management to assist with discharge  planning and identification of hospital follow-up needs prior to discharge Estimated LOS: Unknown at this time. Discharge Concerns: Need to establish a safety plan; Medication compliance and effectiveness Discharge Goals: Return home with outpatient referrals for mental health follow-up including medication management/psychotherapy No legal guardian, has payee  [name], NP,  03/27/2023, 2:32 PM Patient ID: Yolanda [name], female   DOB: Dec 03, 1975, 47 y.o.   MRN: [id number]  Patient ID: Yolanda [name], female   DOB: 09/24/1975, 47 y.o.   MRN: [id number]

## 2023-03-27 NOTE — BH IP Treatment Plan (Signed)
Interdisciplinary Treatment and Diagnostic Plan Update  03/27/2023 Time of Session: 900 Yolanda [name] MRN: [id number]  Principal Diagnosis: Schizoaffective disorder, depressive type (HCC)  Secondary Diagnoses: Principal Problem:   Schizoaffective disorder, depressive type (HCC) Active Problems:   GERD (gastroesophageal reflux disease)   Intellectual disability   Tobacco use disorder   Current Medications:  Current Facility-Administered Medications  Medication Dose Route Frequency Provider Last Rate Last Admin   acetaminophen (TYLENOL) tablet 650 mg  650 mg Oral Q6H PRN [name], NP   650 mg at 03/24/23 1848   alum & mag hydroxide-simeth (MAALOX/MYLANTA) 200-200-20 MG/5ML suspension 30 mL  30 mL Oral Q4H PRN [name], NP   30 mL at 01/28/23 1530   busPIRone (BUSPAR) tablet 15 mg  15 mg Oral BID [name], NP   15 mg at 03/26/23 2117   cyanocobalamin (VITAMIN B12) injection 1,000 mcg  1,000 mcg Intramuscular Q30 days [name], Nadir, MD   1,000 mcg at 03/04/23 1421   diphenhydrAMINE (BENADRYL) capsule 50 mg  50 mg Oral TID PRN [name], NP   50 mg at 01/15/23 1211   Or   diphenhydrAMINE (BENADRYL) injection 50 mg  50 mg Intramuscular TID PRN [name], NP       feeding supplement (ENSURE ENLIVE / ENSURE PLUS) liquid 237 mL  237 mL Oral TID BM Nkwenti, Doris, NP   237 mL at 03/26/23 2119   haloperidol (HALDOL) tablet 5 mg  5 mg Oral TID PRN [name], NP   5 mg at 01/15/23 1211   Or   haloperidol lactate (HALDOL) injection 5 mg  5 mg Intramuscular TID PRN [name], NP       hydrOXYzine (ATARAX) tablet 25 mg  25 mg Oral TID PRN [name], DO   25 mg at 03/22/23 1951   LORazepam (ATIVAN) tablet 2 mg  2 mg Oral TID PRN [name], NP   2 mg at 01/15/23 1211   Or   LORazepam (ATIVAN) injection 2 mg  2 mg Intramuscular TID PRN [name], NP       magnesium hydroxide (MILK OF MAGNESIA) suspension 30 mL  30 mL Oral Daily PRN [name], NP    30 mL at 03/22/23 2112   melatonin tablet 5 mg  5 mg Oral QHS Nkwenti, Doris, NP   5 mg at 03/26/23 2117   nicotine (NICODERM CQ - dosed in mg/24 hours) patch 21 mg  21 mg Transdermal Daily [name], DO   21 mg at 03/25/23 6213   nicotine polacrilex (NICORETTE) gum 2 mg  2 mg Oral PRN [name], MD   2 mg at 03/22/23 1951   paliperidone (INVEGA) 24 hr tablet 6 mg  6 mg Oral Daily [name], NP   6 mg at 03/26/23 2119   pantoprazole (PROTONIX) EC tablet 40 mg  40 mg Oral Daily [name], NP   40 mg at 03/26/23 0827   polyethylene glycol (MIRALAX / GLYCOLAX) packet 17 g  17 g Oral Daily PRN [name], NP   17 g at 03/20/23 1744   sertraline (ZOLOFT) tablet 150 mg  150 mg Oral Daily Massengill, Harrold Donath, MD   150 mg at 03/26/23 0827   SUMAtriptan (IMITREX) tablet 25 mg  25 mg Oral BID PRN [name], NP   25 mg at 03/20/23 1603   traZODone (DESYREL) tablet 50 mg  50 mg Oral QHS [name], NP   50 mg at  03/26/23 2118   Vitamin D (Ergocalciferol) (DRISDOL) 1.25 MG (50000 UNIT) capsule 50,000 Units  50,000 Units Oral Q7 days [name], NP   50,000 Units at 03/23/23 1823   PTA Medications: Medications Prior to Admission  Medication Sig Dispense Refill Last Dose   busPIRone (BUSPAR) 15 MG tablet Take 15 mg by mouth 2 (two) times daily. (Patient not taking: Reported on 12/19/2022)      paliperidone (INVEGA SUSTENNA) 156 MG/ML SUSY injection Inject 156 mg into the muscle once. (Patient not taking: Reported on 12/19/2022)      sertraline (ZOLOFT) 50 MG tablet Take 150 mg by mouth daily. (Patient not taking: Reported on 12/19/2022)      traZODone (DESYREL) 100 MG tablet Take 100 mg by mouth at bedtime as needed for sleep. (Patient not taking: Reported on 11/12/2022)       Patient Stressors: Medication change or noncompliance    Patient Strengths: Forensic psychologist fund of knowledge   Treatment Modalities: Medication Management, Group therapy, Case  management,  1 to 1 session with clinician, Psychoeducation, Recreational therapy.   Physician Treatment Plan for Primary Diagnosis: Schizoaffective disorder, depressive type (HCC) Long Term Goal(s): Improvement in symptoms so as ready for discharge   Short Term Goals: Ability to identify and develop effective coping behaviors will improve Ability to maintain clinical measurements within normal limits will improve Compliance with prescribed medications will improve Ability to identify triggers associated with substance abuse/mental health issues will improve Ability to identify changes in lifestyle to reduce recurrence of condition will improve Ability to verbalize feelings will improve Ability to disclose and discuss suicidal ideas Ability to demonstrate self-control will improve  Medication Management: Evaluate patient's response, side effects, and tolerance of medication regimen.  Therapeutic Interventions: 1 to 1 sessions, Unit Group sessions and Medication administration.  Evaluation of Outcomes: Progressing  Physician Treatment Plan for Secondary Diagnosis: Principal Problem:   Schizoaffective disorder, depressive type (HCC) Active Problems:   GERD (gastroesophageal reflux disease)   Intellectual disability   Tobacco use disorder  Long Term Goal(s): Improvement in symptoms so as ready for discharge   Short Term Goals: Ability to identify and develop effective coping behaviors will improve Ability to maintain clinical measurements within normal limits will improve Compliance with prescribed medications will improve Ability to identify triggers associated with substance abuse/mental health issues will improve Ability to identify changes in lifestyle to reduce recurrence of condition will improve Ability to verbalize feelings will improve Ability to disclose and discuss suicidal ideas Ability to demonstrate self-control will improve     Medication Management: Evaluate  patient's response, side effects, and tolerance of medication regimen.  Therapeutic Interventions: 1 to 1 sessions, Unit Group sessions and Medication administration.  Evaluation of Outcomes: Progressing   RN Treatment Plan for Primary Diagnosis: Schizoaffective disorder, depressive type (HCC) Long Term Goal(s): Knowledge of disease and therapeutic regimen to maintain health will improve  Short Term Goals: Ability to remain free from injury will improve, Ability to verbalize frustration and anger appropriately will improve, Ability to demonstrate self-control, Ability to participate in decision making will improve, Ability to verbalize feelings will improve, Ability to disclose and discuss suicidal ideas, Ability to identify and develop effective coping behaviors will improve, and Compliance with prescribed medications will improve  Medication Management: RN will administer medications as ordered by provider, will assess and evaluate patient's response and provide education to patient for prescribed medication. RN will report any adverse and/or side effects to prescribing provider.  Therapeutic  Interventions: 1 on 1 counseling sessions, Psychoeducation, Medication administration, Evaluate responses to treatment, Monitor vital signs and CBGs as ordered, Perform/monitor CIWA, COWS, AIMS and Fall Risk screenings as ordered, Perform wound care treatments as ordered.  Evaluation of Outcomes: Progressing   LCSW Treatment Plan for Primary Diagnosis: Schizoaffective disorder, depressive type (HCC) Long Term Goal(s): Safe transition to appropriate next level of care at discharge, Engage patient in therapeutic group addressing interpersonal concerns.  Short Term Goals: Engage patient in aftercare planning with referrals and resources, Increase social support, Increase ability to appropriately verbalize feelings, Increase emotional regulation, Facilitate acceptance of mental health diagnosis and concerns,  Facilitate patient progression through stages of change regarding substance use diagnoses and concerns, Identify triggers associated with mental health/substance abuse issues, and Increase skills for wellness and recovery  Therapeutic Interventions: Assess for all discharge needs, 1 to 1 time with Social worker, Explore available resources and support systems, Assess for adequacy in community support network, Educate family and significant other(s) on suicide prevention, Complete Psychosocial Assessment, Interpersonal group therapy.  Evaluation of Outcomes: Progressing   Progress in Treatment: Attending groups: Yes. Participating in groups: Yes. Taking medication as prescribed: Yes. Toleration medication: Yes. Family/Significant other contact made: Yes, individual(s) contacted:  Mother Leighan Pinzone Patient understands diagnosis: Yes. Discussing patient identified problems/goals with staff: Yes. Medical problems stabilized or resolved: Yes. Denies suicidal/homicidal ideation: Yes. Issues/concerns per patient self-inventory: Yes. Other: None  New problem(s) identified: No, Describe:  None Reported  New Short Term/Long Term Goal(s):medication management for mood stabilization; elimination of SI thoughts; development of comprehensive mental wellness  Patient Goals:  Coping Skills/Housing  Discharge Plan or Barriers: CSW will continue to follow and assess for appropriate referrals and possible discharge planning.   Reason for Continuation of Hospitalization: Anxiety Depression Medication stabilization Suicidal ideation  Estimated Length of Stay: Unknown  Last 3 [place] Suicide Severity Risk Score: Flowsheet Row Admission (Current) [facility] from 12/19/2022 in [facility] at [facility] from 11/12/2022 in [facility] Department at [facility]  C-SSRS RISK CATEGORY High Risk High  Risk Moderate Risk       Last [facility] 2/9 Scores:     No data to display           medication stabilization, elimination of SI thoughts, development of comprehensive mental wellness plan.   Scribe for Treatment Team: Ane Payment, LCSW 03/27/2023 7:44 AM

## 2023-03-27 NOTE — Progress Notes (Signed)
   03/27/23 1000  Psych Admission Type (Psych Patients Only)  Admission Status Involuntary  Psychosocial Assessment  Patient Complaints None  Eye Contact Fair  Facial Expression Flat  Affect Appropriate to circumstance  Speech Slow  Interaction Minimal  Motor Activity Slow;Tremors  Appearance/Hygiene Unremarkable  Behavior Characteristics Cooperative;Appropriate to situation  Mood Pleasant  Thought Process  Coherency WDL  Content WDL  Delusions None reported or observed  Perception WDL  Hallucination None reported or observed  Judgment Poor  Confusion None  Danger to Self  Current suicidal ideation? Denies  Self-Injurious Behavior No self-injurious ideation or behavior indicators observed or expressed   Agreement Not to Harm Self Yes  Description of Agreement verbal

## 2023-03-28 DIAGNOSIS — F251 Schizoaffective disorder, depressive type: Secondary | ICD-10-CM | POA: Diagnosis not present

## 2023-03-28 NOTE — Plan of Care (Signed)
Problem: Education: Goal: Utilization of techniques to improve thought processes will improve Outcome: Not Progressing Goal: Knowledge of the prescribed therapeutic regimen will improve Outcome: Not Progressing   Problem: Activity: Goal: Interest or engagement in leisure activities will improve Outcome: Not Progressing Goal: Imbalance in normal sleep/wake cycle will improve Outcome: Not Progressing

## 2023-03-28 NOTE — Progress Notes (Signed)
   03/28/23 0900  Psych Admission Type (Psych Patients Only)  Admission Status Involuntary  Psychosocial Assessment  Patient Complaints None  Eye Contact Brief  Facial Expression Flat  Affect Depressed  Speech Slow  Interaction Minimal  Motor Activity Slow;Tremors;Unsteady  Appearance/Hygiene Unremarkable  Behavior Characteristics Cooperative  Mood Pleasant;Depressed  Thought Process  Coherency WDL  Content WDL  Delusions None reported or observed  Perception WDL  Hallucination None reported or observed  Judgment Poor  Confusion None  Danger to Self  Current suicidal ideation? Denies  Self-Injurious Behavior No self-injurious ideation or behavior indicators observed or expressed   Agreement Not to Harm Self Yes  Description of Agreement verbal  Danger to Others  Danger to Others None reported or observed

## 2023-03-28 NOTE — Group Note (Signed)
Date:  03/28/2023 Time:  10:56 AM  Group Topic/Focus:  Goals Group:   The focus of this group is to help patients establish daily goals to achieve during treatment and discuss how the patient can incorporate goal setting into their daily lives to aide in recovery.    Participation Level:  Active  Participation Quality:  Appropriate  Affect:  Appropriate  Cognitive:  Lacking  Insight: Lacking  Engagement in Group:  Limited  Modes of Intervention:  Discussion  Additional Comments:     Yolanda [name] 03/28/2023, 10:56 AM

## 2023-03-28 NOTE — Progress Notes (Signed)
[facility] MD Progress Note  03/28/2023 12:44 PM Yolanda [name]  MRN:  [id number]  Principal Problem: Schizoaffective disorder, depressive type (HCC)  Diagnosis: Principal Problem:   Schizoaffective disorder, depressive type (HCC) Active Problems:   GERD (gastroesophageal reflux disease)   Intellectual disability   Tobacco use disorder  Reason for admission: This is the first psychiatric admission in this [facility] in 12 years for this 31 AA female with an extensive hx of mental illnesses & probable polysubstance use disorders. She is admitted to the [facility] from the [facility] hospital with complain of worsening suicidal ideations with plan to stab herself. Per chart review, patient apparently reported at the ED that she has been depressed for a while & has not been taking her mental health medications. After medical evaluation.clearance, she was transferred to the [facility] for further psychiatric evaluation/treatments.   24 hr chart review: Sleep Hours last night: 7.25 hours Nursing Concerns: Patient started wetting the bed. Behavioral episodes in the past 24 hrs: Remaining Isolative to the room  at times. Medication Compliance: Compliant  Vital Signs in the past 24 hrs: WNL PRN Medications in the past 24 hrs: Hydroxyzine.  Daily note: Yolanda [name] is seen in her room. Chart reviewed. The chart findings discussed with the treatment team. She was sitting in the day room prior to this follow-up evaluation. She presents with a good affect, good eye contact & verbally responsive. She reports feeling & doing okay while eating potato chips. She says she slept well last night. Staff reported that she slept for 7.25 hours. Patient is taking & tolerating her treatment regimen. She denies any side effects. She currently denies any SIHI, AVH, delusional thoughts or paranoia. She does not appear to be responding to any internal stimuli. There are no changes made on her current plan of care, will continue as already in  progress. Reviewed current lab results, stable. Vital signs remains stable. Patient is encouraged to come out of her room daily & attend group sessions. Staff reports that she wetted her bed last night. Her bedding were stripped, bed thoroughly cleaned including her over all room. Yolanda [name] continues to wait patiently to hear from the DSS that a place of residence has been secured for her. She in no apparent distress.  Total Time spent with patient:  35 minutes  Past Psychiatric History:  See H & P  Past Medical History:  Past Medical History:  Diagnosis Date   Bipolar affect, depressed (HCC)    Constipation 08/17/2022   Depression    Falls 07/21/2022   Fracture of femoral neck, right, closed (HCC) 01/17/2022   Herpes simplex 08/22/2017   Open fracture dislocation of right elbow joint 01/17/2022    Past Surgical History:  Procedure Laterality Date   NO PAST SURGERIES     SALPINGECTOMY     Family History: History reviewed. No pertinent family history.  Family Psychiatric  History: See H & P  Social History:  Social History   Substance and Sexual Activity  Alcohol Use Yes     Social History   Substance and Sexual Activity  Drug Use Yes   Types: Cocaine, Marijuana    Social History   Socioeconomic History   Marital status: Single    Spouse name: Not on file   Number of children: Not on file   Years of education: Not on file   Highest education level: Not on file  Occupational History   Not on file  Tobacco Use   Smoking status:  Every Day   Smokeless tobacco: Not on file  Substance and Sexual Activity   Alcohol use: Yes   Drug use: Yes    Types: Cocaine, Marijuana   Sexual activity: Yes  Other Topics Concern   Not on file  Social History Narrative   Not on file   Social Determinants of Health   Financial Resource Strain: Low Risk  (09/12/2022)   Received from [facility]   Overall Financial Resource Strain (CARDIA)    Difficulty of Paying  Living Expenses: Not hard at all  Food Insecurity: Patient Declined (12/20/2022)   Hunger Vital Sign    Worried About Running Out of Food in the Last Year: Patient declined    Ran Out of Food in the Last Year: Patient declined  Transportation Needs: No Transportation Needs (12/20/2022)   PRAPARE - Administrator, Civil Service (Medical): No    Lack of Transportation (Non-Medical): No  Physical Activity: Not on file  Stress: No Stress Concern Present (07/17/2022)   Received from [facility] - Occupational Stress Questionnaire    Feeling of Stress : Not at all  Social Connections: Unknown (07/16/2022)   Received from [facility]   Social Network    Social Network: Not on file   Sleep: Good  Appetite:  Good  Current Medications: Current Facility-Administered Medications  Medication Dose Route Frequency Provider Last Rate Last Admin   acetaminophen (TYLENOL) tablet 650 mg  650 mg Oral Q6H PRN [name], NP   650 mg at 03/24/23 1848   alum & mag hydroxide-simeth (MAALOX/MYLANTA) 200-200-20 MG/5ML suspension 30 mL  30 mL Oral Q4H PRN [name], NP   30 mL at 01/28/23 1530   busPIRone (BUSPAR) tablet 15 mg  15 mg Oral BID [name], NP   15 mg at 03/28/23 0813   cyanocobalamin (VITAMIN B12) injection 1,000 mcg  1,000 mcg Intramuscular Q30 days [name], Nadir, MD   1,000 mcg at 03/04/23 1421   diphenhydrAMINE (BENADRYL) capsule 50 mg  50 mg Oral TID PRN [name], NP   50 mg at 01/15/23 1211   Or   diphenhydrAMINE (BENADRYL) injection 50 mg  50 mg Intramuscular TID PRN [name], NP       feeding supplement (ENSURE ENLIVE / ENSURE PLUS) liquid 237 mL  237 mL Oral TID BM Nkwenti, Doris, NP   237 mL at 03/28/23 1000   haloperidol (HALDOL) tablet 5 mg  5 mg Oral TID PRN [name], NP   5 mg at 01/15/23 1211   Or   haloperidol lactate (HALDOL) injection 5 mg  5 mg Intramuscular TID PRN  [name], NP       hydrOXYzine (ATARAX) tablet 25 mg  25 mg Oral TID PRN [name], DO   25 mg at 03/27/23 2059   LORazepam (ATIVAN) tablet 2 mg  2 mg Oral TID PRN [name], NP   2 mg at 01/15/23 1211   Or   LORazepam (ATIVAN) injection 2 mg  2 mg Intramuscular TID PRN [name], NP       magnesium hydroxide (MILK OF MAGNESIA) suspension 30 mL  30 mL Oral Daily PRN [name], NP   30 mL at 03/22/23 2112   melatonin tablet 5 mg  5 mg Oral QHS Nkwenti, Doris, NP   5 mg at 03/27/23 2100   nicotine (NICODERM CQ - dosed in  mg/24 hours) patch 21 mg  21 mg Transdermal Daily [name], DO   21 mg at 03/25/23 1914   nicotine polacrilex (NICORETTE) gum 2 mg  2 mg Oral PRN [name], MD   2 mg at 03/22/23 1951   paliperidone (INVEGA) 24 hr tablet 6 mg  6 mg Oral Daily Nkwenti, Tyler Aas, NP   6 mg at 03/27/23 2100   pantoprazole (PROTONIX) EC tablet 40 mg  40 mg Oral Daily [name], NP   40 mg at 03/28/23 0813   polyethylene glycol (MIRALAX / GLYCOLAX) packet 17 g  17 g Oral Daily PRN [name], NP   17 g at 03/20/23 1744   sertraline (ZOLOFT) tablet 150 mg  150 mg Oral Daily Massengill, Harrold Donath, MD   150 mg at 03/28/23 0813   SUMAtriptan (IMITREX) tablet 25 mg  25 mg Oral BID PRN [name], NP   25 mg at 03/20/23 1603   traZODone (DESYREL) tablet 50 mg  50 mg Oral QHS [name], NP   50 mg at 03/27/23 2100   Vitamin D (Ergocalciferol) (DRISDOL) 1.25 MG (50000 UNIT) capsule 50,000 Units  50,000 Units Oral Q7 days [name], NP   50,000 Units at 03/23/23 1823   Lab Results:  No results found for this or any previous visit (from the past 48 hour(s)).  Blood Alcohol level:  Lab Results  Component Value Date   ETH <10 12/19/2022   ETH <10 11/21/2019   Metabolic Disorder Labs: Lab Results  Component Value Date   HGBA1C 4.4 (L) 12/21/2022   MPG 79.58 12/21/2022   No results found for: "PROLACTIN" Lab Results  Component Value Date   CHOL 218 (H)  12/21/2022   TRIG 81 12/21/2022   HDL 53 12/21/2022   CHOLHDL 4.1 12/21/2022   VLDL 16 12/21/2022   LDLCALC 149 (H) 12/21/2022   LDLCALC 110 (H) 03/13/2011   Physical Findings: AIMS: Facial and Oral Movements Muscles of Facial Expression: None, normal Lips and Perioral Area: None, normal Jaw: None, normal Tongue: None, normal,Extremity Movements Upper (arms, wrists, hands, fingers): None, normal Lower (legs, knees, ankles, toes): None, normal, Trunk Movements Neck, shoulders, hips: None, normal, Overall Severity Severity of abnormal movements (highest score from questions above): None, normal Incapacitation due to abnormal movements: None, normal Patient's awareness of abnormal movements (rate only patient's report): No Awareness, Dental Status Current problems with teeth and/or dentures?: No Does patient usually wear dentures?: No  CIWA:    COWS:    AIMS:0 Musculoskeletal: Strength & Muscle Tone: within normal limits Gait & Station: normal Patient leans: N/A  Psychiatric Specialty Exam:  Presentation  General Appearance:  Appropriate for Environment  Eye Contact: Limited to baseline Speech: Decreased amount, decreased tone and volume but at baseline Speech Volume: Normal  Handedness: Right  Mood and Affect  Mood: Sad and depressed mainly secondary to being in the hospital with no place to go Affect: Congruent  Thought Process  Thought Processes: Coherent  Descriptions of Associations:Intact  Orientation:Partial  Thought Content:Logical Concrete History of Schizophrenia/Schizoaffective disorder:Yes  Duration of Psychotic Symptoms:Greater than six months  Hallucinations:Hallucinations: None  Denies AVH, does not appear responding to stimuli  Ideas of Reference: None  Suicidal Thoughts:Suicidal Thoughts: No  Denies passive or active [name] intention or plan   Homicidal Thoughts:Homicidal Thoughts: No  Denies HI  Sensorium  Memory: Immediate  Good  Judgment: Fair  Insight: Fair  Art therapist  Concentration: Fair  Attention Span: Fair  Recall: Fair  Fund of Knowledge: Fair  Language: Fair  Psychomotor Activity  Psychomotor Activity: Psychomotor Activity: Normal  Assets  Assets: Resilience  Sleep  Sleep: Sleep: Good Number of Hours of Sleep: 7.25  Physical Exam: Physical Exam Vitals and nursing note reviewed.  Genitourinary:    Comments: Deferred Neurological:     General: No focal deficit present.     Mental Status: She is oriented to person, place, and time.  Psychiatric:        Mood and Affect: Mood normal.        Behavior: Behavior normal.        Thought Content: Thought content normal.    Review of Systems  Constitutional:  Negative for fever.  HENT:  Negative for hearing loss.   Eyes:  Negative for blurred vision.  Respiratory:  Negative for cough.   Cardiovascular:  Negative for chest pain.  Gastrointestinal:  Negative for heartburn.  Genitourinary:  Negative for dysuria.  Musculoskeletal:  Negative for myalgias.  Skin:  Negative for rash.  Neurological:  Negative for dizziness.  Psychiatric/Behavioral:  Positive for depression. Negative for hallucinations, memory loss, substance abuse and suicidal ideas. The patient is nervous/anxious and has insomnia.   All other systems reviewed and are negative.  Blood pressure 98/63, pulse 96, temperature 97.6 F (36.4 C), temperature source Oral, resp. rate 18, height 4\' 11"  (1.499 m), weight 63 kg, SpO2 97%. Body mass index is 28.05 kg/m.  Treatment Plan Summary: Daily contact with patient to assess and evaluate symptoms and progress in treatment and Medication management.    Still waiting on safe disposition as patient would not be able to live independently given cognitive impairment. Recommend assisted living.    No medication side effects reported.  Because of urinary incontinence, recommended adult diapers to be worn.    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  Plan:  -Continue Imitrex 25 mg PRN BID for migraines -Continue Colace 100 mg daily for constipation -Continue Vitamin D 50.000 units weekly for bone health. -Continue Sertraline150 mg po Q daily for depression/anxiety -Continue Buspar 15 mg po bid for anxiety.  -Continue paliperidone 6 mg po qd for mood stabilization -Continue Trazodone 50 mg nightly for insomnia  -Continue Nicoderm 21 mg topically Q 24 hrs for nicotine withdrawal management. -Continue vitamin B12 1000 mcg IM weekly for 4 weeks then monthly -Continue Melatonin 5 mg nightly for sleep -Continue Protonix EC 40 mg p.o. daily for GERD -Continue MiraLAX 17 g p.o. PRN for constipation -Continue hydroxyzine 25 mg p.o. 3 times daily as needed for anxiety -Continue Ensure nutritional shakes TID in between meals  -Previously discontinued Hydroxyzine 50 mg nightly-hypotension in the mornings   Safety and Monitoring: Voluntary admission to inpatient psychiatric unit for safety, stabilization and treatment Daily contact with patient to assess and evaluate symptoms and progress in treatment Patient's case to be discussed in multi-disciplinary team meeting Observation Level : q15 minute checks Vital signs: q12 hours Precautions: Safety   Discharge Planning: Social work and case management to assist with discharge planning and identification of hospital follow-up needs prior to discharge Estimated LOS: Unknown at this time. Discharge Concerns: Need to establish a safety plan; Medication compliance and effectiveness Discharge Goals: Return home with outpatient referrals for mental health follow-up including medication management/psychotherapy No legal  guardian, has payee  Armandina Stammer, NP,  03/28/2023, 12:44 PM  Patient ID: Yolanda [name], female   DOB: 1975/07/25, 47 y.o.   MRN: [id number] Patient ID: TASHYA PATCH, female   DOB: 03-09-1976, 47 y.o.   MRN: [id number]

## 2023-03-28 NOTE — Group Note (Signed)
LCSW Group Therapy Note   Group Date: 03/28/2023 Start Time: 1100 End Time: 1200  LCSW Group Therapy Note     03/28/2023 12:54 PM     Type of Therapy and Topic:  Group Therapy:  Strengths     Participation Level:  Did Not Attend     Description of Group: In this group patients will be encouraged to explore personal strengths that are conducive to recovery and well-being. They will be guided to discuss their thoughts, feelings, and behaviors related to these strengths. The group will process together ways that individualized strengths help patients build resilience and facilitate positive treatment outcomes. Each patient will be challenged to identify personal strengths that they foster as well as positive characteristics they would like to embody as they progress through treatment.This group will be process-oriented, with patients participating in exploration of their own experiences as well as giving and receiving support and challenge from other group members.     Therapeutic Goals:  1.    Patient will identify a collaborative list of positive characteristics that promote positive treatment outcomes and well-being.  2.    Patient will identify three personal strengths that they currently exemplify.  3.    Patient will identify feelings, thought process and behaviors related to these strengths.  4.    Patient will identify two ways they will use these personal strengths to help them reach their individualized treatment goals.         Summary of Patient Progress   Did not attend group         Therapeutic Modalities:    Cognitive Behavioral Therapy  Solution Focused Therapy  Motivational Interviewing    Izell [place], Theresia Majors 03/28/2023  12:54 PM

## 2023-03-28 NOTE — [facility] Group Notes (Signed)
[facility] Group Notes:  (Nursing/MHT/Case Management/Adjunct)  Date:  03/28/2023  Time:  8:31 PM  Type of Therapy:   Wrap up  Participation Level:  Active  Participation Quality:  Appropriate  Affect:  Appropriate  Cognitive:  Alert and Appropriate  Insight:  Appropriate and Good  Engagement in Group:  Engaged  Modes of Intervention:  Discussion  Summary of Progress/Problems: Pt. Shared how today was good because she talked with her baby girl on today. She also shared how she came out of her room on today.   Annell Greening Kirvin 03/28/2023, 8:31 PM

## 2023-03-28 NOTE — Plan of Care (Signed)
Problem: Education: Goal: Utilization of techniques to improve thought processes will improve Outcome: Progressing Goal: Knowledge of the prescribed therapeutic regimen will improve Outcome: Progressing   Problem: Activity: Goal: Interest or engagement in leisure activities will improve Outcome: Progressing Goal: Imbalance in normal sleep/wake cycle will improve Outcome: Progressing   Problem: Coping: Goal: Coping ability will improve Outcome: Progressing Goal: Will verbalize feelings Outcome: Progressing   Problem: Health Behavior/Discharge Planning: Goal: Ability to make decisions will improve Outcome: Progressing Goal: Compliance with therapeutic regimen will improve Outcome: Progressing   Problem: Role Relationship: Goal: Will demonstrate positive changes in social behaviors and relationships Outcome: Progressing   Problem: Safety: Goal: Ability to disclose and discuss suicidal ideas will improve Outcome: Progressing Goal: Ability to identify and utilize support systems that promote safety will improve Outcome: Progressing   Problem: Self-Concept: Goal: Will verbalize positive feelings about self Outcome: Progressing

## 2023-03-29 DIAGNOSIS — F251 Schizoaffective disorder, depressive type: Secondary | ICD-10-CM | POA: Diagnosis not present

## 2023-03-29 NOTE — Progress Notes (Signed)
   03/28/23 2201  Psych Admission Type (Psych Patients Only)  Admission Status Involuntary  Psychosocial Assessment  Patient Complaints None  Eye Contact Fair  Facial Expression Flat  Affect Depressed  Speech Slow  Interaction Minimal  Motor Activity Slow  Appearance/Hygiene Unremarkable  Behavior Characteristics Cooperative  Mood Pleasant  Aggressive Behavior  Effect No apparent injury  Thought Process  Coherency WDL  Content WDL  Delusions None reported or observed  Perception WDL  Hallucination None reported or observed  Judgment Poor  Confusion None  Danger to Self  Current suicidal ideation? Denies  Self-Injurious Behavior No self-injurious ideation or behavior indicators observed or expressed   Agreement Not to Harm Self Yes  Description of Agreement verbal  Danger to Others  Danger to Others None reported or observed

## 2023-03-29 NOTE — Group Note (Signed)
Recreation Therapy Group Note   Group Topic:Problem Solving  Group Date: 03/29/2023 Start Time: 0930 End Time: 0955 Facilitators: Frederick Marro-McCall, LRT,CTRS Location: [address] Dayroom   Group Topic: Communication, Team Building, Problem Solving  Goal Area(s) Addresses:  Patient will effectively work with peer towards shared goal.  Patient will identify skills used to make activity successful.  Patient will identify how skills used during activity can be used to reach post d/c goals.   Intervention: STEM Activity  Group Description: Stage manager. In teams of 3-5, patients were given 12 plastic drinking straws and an equal length of masking tape. Using the materials provided, patients were asked to build a landing pad to catch a golf ball dropped from approximately 5 feet in the air. All materials were required to be used by the team in their design. LRT facilitated post-activity discussion.  Education: Pharmacist, community, Scientist, physiological, Discharge Planning   Education Outcome: Acknowledges education/In group clarification offered/Needs additional education.    Affect/Mood: Appropriate   Participation Level: None   Participation Quality: Independent   Behavior: On-looking   Speech/Thought Process: Relevant   Insight: Poor   Judgement: Poor   Modes of Intervention: STEM Activity   Patient Response to Interventions:  Attentive   Education Outcome:  In group clarification offered    Clinical Observations/Individualized Feedback: Pt attended group session. Pt didn't participate in group session. Pt sat quietly and watched as peers completed the activity.     Plan: Continue to engage patient in RT group sessions 2-3x/week.   Rafal Archuleta-McCall, LRT,CTRS 03/29/2023 12:21 PM

## 2023-03-29 NOTE — Progress Notes (Signed)
D: Patient is alert and oriented. Denies SI, HI, AVH, and verbally contracts for safety. Patient reports incontinence.    A: Scheduled medications administered per MD order. PRN nicotine gum administered. Support provided. Patient educated on safety on the unit and medications. Routine safety checks every 15 minutes. Patient stated understanding to tell nurse about any new physical symptoms. Patient understands to tell staff of any needs.     R: No adverse drug reactions noted. Patient remains safe at this time and will continue to monitor.    03/29/23 1000  Psych Admission Type (Psych Patients Only)  Admission Status Involuntary  Psychosocial Assessment  Patient Complaints Depression  Eye Contact Fair  Facial Expression Flat  Affect Depressed  Speech Slow;Soft  Interaction Minimal  Motor Activity Slow  Appearance/Hygiene Unremarkable  Behavior Characteristics Cooperative  Mood Depressed  Thought Process  Coherency WDL  Content WDL  Delusions None reported or observed  Perception WDL  Hallucination None reported or observed  Judgment Poor  Confusion None  Danger to Self  Current suicidal ideation? Denies  Self-Injurious Behavior No self-injurious ideation or behavior indicators observed or expressed   Agreement Not to Harm Self Yes  Description of Agreement verbal  Danger to Others  Danger to Others None reported or observed

## 2023-03-29 NOTE — Progress Notes (Signed)
[facility] MD Progress Note  03/29/2023 3:11 PM Yolanda [name]  MRN:  [id number]  Principal Problem: Schizoaffective disorder, depressive type (HCC)  Diagnosis: Principal Problem:   Schizoaffective disorder, depressive type (HCC) Active Problems:   GERD (gastroesophageal reflux disease)   Intellectual disability   Tobacco use disorder  Reason for admission: This is the first psychiatric admission in this [facility] in 12 years for this 47 AA female with an extensive hx of mental illnesses & probable polysubstance use disorders. She is admitted to the [facility] from the [facility] hospital with complain of worsening suicidal ideations with plan to stab herself. Per chart review, patient apparently reported at the ED that she has been depressed for a while & has not been taking her mental health medications. After medical evaluation.clearance, she was transferred to the [facility] for further psychiatric evaluation/treatments.   24 hr chart review: Sleep Hours last night: 7.5 hours. Nursing Concerns: None. Behavioral episodes in the past 24 hrs: Remaining Isolative to the room  at times. Medication Compliance: Compliant  Vital Signs in the past 24 hrs: WNL PRN Medications in the past 24 hrs: Hydroxyzine.  Daily note: Yolanda [name] is seen today. Chart reviewed. The chart findings discussed with the treatment team. She presents with a good affect, good eye contact & verbally responsive. She reports, "I'm doing okay. I slept okay last night. I'm taking all my medicines. I'm kind of excited because Joy told me that the meeting with the DSS will be at 11:00 am today. I will also be in the meeting". Guest denies any SIHI, AVH, delusional thoughts or paranoia. She does not appear to be responding to any internal stimuli. Ashlie continues to take & tolerate her treatment regimen. Denies any side effects. There are no changes made on the current plan of care. Continue as already in progress.  Per the social worker's notes on  meeting with DSS:   CSW along with patient attended a virtual meeting with Ms. Corey Skains, Belva Chimes of [facility], Ms. Hayes Ludwig of DSS and Dr. [name] to discuss latest progress concerning placement. CSW provided [facility] with scanned copies of the Comprehensive Care Assessment and Psychological Evaluation as requested. Will continue to monitor.            Total Time spent with patient:  35 minutes  Past Psychiatric History:  See H & P  Past Medical History:  Past Medical History:  Diagnosis Date   Bipolar affect, depressed (HCC)    Constipation 08/17/2022   Depression    Falls 07/21/2022   Fracture of femoral neck, right, closed (HCC) 01/17/2022   Herpes simplex 08/22/2017   Open fracture dislocation of right elbow joint 01/17/2022    Past Surgical History:  Procedure Laterality Date   NO PAST SURGERIES     SALPINGECTOMY     Family History: History reviewed. No pertinent family history.  Family Psychiatric  History: See H & P  Social History:  Social History   Substance and Sexual Activity  Alcohol Use Yes     Social History   Substance and Sexual Activity  Drug Use Yes   Types: Cocaine, Marijuana    Social History   Socioeconomic History   Marital status: Single    Spouse name: Not on file   Number of children: Not on file   Years of education: Not on file   Highest education level: Not on file  Occupational History   Not on file  Tobacco Use   Smoking status:  Every Day   Smokeless tobacco: Not on file  Substance and Sexual Activity   Alcohol use: Yes   Drug use: Yes    Types: Cocaine, Marijuana   Sexual activity: Yes  Other Topics Concern   Not on file  Social History Narrative   Not on file   Social Determinants of Health   Financial Resource Strain: Low Risk  (09/12/2022)   Received from [facility]   Overall Financial Resource Strain (CARDIA)    Difficulty of Paying Living Expenses: Not hard at all  Food  Insecurity: Patient Declined (12/20/2022)   Hunger Vital Sign    Worried About Running Out of Food in the Last Year: Patient declined    Ran Out of Food in the Last Year: Patient declined  Transportation Needs: No Transportation Needs (12/20/2022)   PRAPARE - Administrator, Civil Service (Medical): No    Lack of Transportation (Non-Medical): No  Physical Activity: Not on file  Stress: No Stress Concern Present (07/17/2022)   Received from [facility] - Occupational Stress Questionnaire    Feeling of Stress : Not at all  Social Connections: Unknown (07/16/2022)   Received from [facility]   Social Network    Social Network: Not on file   Sleep: Good  Appetite:  Good  Current Medications: Current Facility-Administered Medications  Medication Dose Route Frequency Provider Last Rate Last Admin   acetaminophen (TYLENOL) tablet 650 mg  650 mg Oral Q6H PRN [name], NP   650 mg at 03/24/23 1848   alum & mag hydroxide-simeth (MAALOX/MYLANTA) 200-200-20 MG/5ML suspension 30 mL  30 mL Oral Q4H PRN [name], NP   30 mL at 01/28/23 1530   busPIRone (BUSPAR) tablet 15 mg  15 mg Oral BID [name], NP   15 mg at 03/29/23 0839   cyanocobalamin (VITAMIN B12) injection 1,000 mcg  1,000 mcg Intramuscular Q30 days [name], Nadir, MD   1,000 mcg at 03/04/23 1421   diphenhydrAMINE (BENADRYL) capsule 50 mg  50 mg Oral TID PRN [name], NP   50 mg at 01/15/23 1211   Or   diphenhydrAMINE (BENADRYL) injection 50 mg  50 mg Intramuscular TID PRN [name], NP       feeding supplement (ENSURE ENLIVE / ENSURE PLUS) liquid 237 mL  237 mL Oral TID BM Nkwenti, Doris, NP   237 mL at 03/29/23 1354   haloperidol (HALDOL) tablet 5 mg  5 mg Oral TID PRN [name], NP   5 mg at 01/15/23 1211   Or   haloperidol lactate (HALDOL) injection 5 mg  5 mg Intramuscular TID PRN [name], NP       hydrOXYzine  (ATARAX) tablet 25 mg  25 mg Oral TID PRN [name], DO   25 mg at 03/27/23 2059   LORazepam (ATIVAN) tablet 2 mg  2 mg Oral TID PRN [name], NP   2 mg at 01/15/23 1211   Or   LORazepam (ATIVAN) injection 2 mg  2 mg Intramuscular TID PRN [name], NP       magnesium hydroxide (MILK OF MAGNESIA) suspension 30 mL  30 mL Oral Daily PRN [name], NP   30 mL at 03/28/23 2134   melatonin tablet 5 mg  5 mg Oral QHS Nkwenti, Tyler Aas, NP   5 mg at 03/28/23 2111   nicotine (NICODERM CQ - dosed in  mg/24 hours) patch 21 mg  21 mg Transdermal Daily [name], DO   21 mg at 03/25/23 1093   nicotine polacrilex (NICORETTE) gum 2 mg  2 mg Oral PRN [name], MD   2 mg at 03/29/23 0839   paliperidone (INVEGA) 24 hr tablet 6 mg  6 mg Oral Daily [name], NP   6 mg at 03/28/23 2111   pantoprazole (PROTONIX) EC tablet 40 mg  40 mg Oral Daily [name], NP   40 mg at 03/29/23 0839   polyethylene glycol (MIRALAX / GLYCOLAX) packet 17 g  17 g Oral Daily PRN [name], NP   17 g at 03/20/23 1744   sertraline (ZOLOFT) tablet 150 mg  150 mg Oral Daily Massengill, Harrold Donath, MD   150 mg at 03/29/23 2355   SUMAtriptan (IMITREX) tablet 25 mg  25 mg Oral BID PRN [name], NP   25 mg at 03/20/23 1603   traZODone (DESYREL) tablet 50 mg  50 mg Oral QHS [name], NP   50 mg at 03/28/23 2111   Vitamin D (Ergocalciferol) (DRISDOL) 1.25 MG (50000 UNIT) capsule 50,000 Units  50,000 Units Oral Q7 days [name], NP   50,000 Units at 03/23/23 1823   Lab Results:  No results found for this or any previous visit (from the past 48 hour(s)).  Blood Alcohol level:  Lab Results  Component Value Date   ETH <10 12/19/2022   ETH <10 11/21/2019   Metabolic Disorder Labs: Lab Results  Component Value Date   HGBA1C 4.4 (L) 12/21/2022   MPG 79.58 12/21/2022   No results found for: "PROLACTIN" Lab Results  Component Value Date   CHOL 218 (H) 12/21/2022   TRIG 81 12/21/2022    HDL 53 12/21/2022   CHOLHDL 4.1 12/21/2022   VLDL 16 12/21/2022   LDLCALC 149 (H) 12/21/2022   LDLCALC 110 (H) 03/13/2011   Physical Findings: AIMS: Facial and Oral Movements Muscles of Facial Expression: None, normal Lips and Perioral Area: None, normal Jaw: None, normal Tongue: None, normal,Extremity Movements Upper (arms, wrists, hands, fingers): None, normal Lower (legs, knees, ankles, toes): None, normal, Trunk Movements Neck, shoulders, hips: None, normal, Overall Severity Severity of abnormal movements (highest score from questions above): None, normal Incapacitation due to abnormal movements: None, normal Patient's awareness of abnormal movements (rate only patient's report): No Awareness, Dental Status Current problems with teeth and/or dentures?: No Does patient usually wear dentures?: No  CIWA:    COWS:    AIMS:0 Musculoskeletal: Strength & Muscle Tone: within normal limits Gait & Station: normal Patient leans: N/A  Psychiatric Specialty Exam:  Presentation  General Appearance:  Appropriate for Environment; Casual; Fairly Groomed  Eye Contact: Limited to baseline Speech: Decreased amount, decreased tone and volume but at baseline Speech Volume: Normal  Handedness: Right  Mood and Affect  Mood: Sad and depressed mainly secondary to being in the hospital with no place to go Affect: Congruent  Thought Process  Thought Processes: Coherent; Goal Directed; Linear  Descriptions of Associations:Intact  Orientation:Full (Time, Place and Person)  Thought Content:Logical [place]crete History of Schizophrenia/Schizoaffective disorder:Yes  Duration of Psychotic Symptoms:Greater than six months  Hallucinations:Hallucinations: None Description of Auditory Hallucinations: NA   Denies AVH, does not appear responding to stimuli  Ideas of Reference: None  Suicidal Thoughts:Suicidal Thoughts: No SI Active Intent and/or Plan: Without Intent; Without Plan;  Without Means to Carry Out; Without Access to Means SI Passive Intent and/or Plan: Without Intent; Without Plan;  Without Means to Carry Out; Without Access to Means   Denies passive or active SI intention or plan   Homicidal Thoughts:Homicidal Thoughts: No   Denies HI  Sensorium  Memory: Immediate Good; Recent Good; Remote Good  Judgment: Good  Insight: Good  Executive Functions  Concentration: Good  Attention Span: Good  Recall: Good  Fund of Knowledge: Fair  Language: Fair  Psychomotor Activity  Psychomotor Activity: Psychomotor Activity: Normal   Assets  Assets: Communication Skills; Desire for Improvement; Financial Resources/Insurance; Resilience; Social Support  Sleep  Sleep: Sleep: Good Number of Hours of Sleep: 7.5   Physical Exam: Physical Exam Vitals and nursing note reviewed.  Genitourinary:    Comments: Deferred Neurological:     General: No focal deficit present.     Mental Status: She is oriented to person, place, and time.  Psychiatric:        Mood and Affect: Mood normal.        Behavior: Behavior normal.        Thought Content: Thought content normal.    Review of Systems  Constitutional:  Negative for fever.  HENT:  Negative for hearing loss.   Eyes:  Negative for blurred vision.  Respiratory:  Negative for cough.   Cardiovascular:  Negative for chest pain.  Gastrointestinal:  Negative for heartburn.  Genitourinary:  Negative for dysuria.  Musculoskeletal:  Negative for myalgias.  Skin:  Negative for rash.  Neurological:  Negative for dizziness.  Psychiatric/Behavioral:  Positive for depression. Negative for hallucinations, memory loss, substance abuse and suicidal ideas. The patient is nervous/anxious and has insomnia.   All other systems reviewed and are negative.  Blood pressure 101/72, pulse 91, temperature 97.6 F (36.4 C), temperature source Oral, resp. rate 18, height 4\' 11"  (1.499 m), weight 63 kg, SpO2 98%.  Body mass index is 28.05 kg/m.  Treatment Plan Summary: Daily contact with patient to assess and evaluate symptoms and progress in treatment and Medication management.    Still waiting on safe disposition as patient would not be able to live independently given cognitive impairment. Recommend assisted living.    No medication side effects reported.  Because of urinary incontinence, recommended adult diapers to be worn.   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  Plan:  -Continue Imitrex 25 mg PRN BID for migraines -Continue Colace 100 mg daily for constipation -Continue Vitamin D 50.000 units weekly for bone health. -Continue Sertraline150 mg po Q daily for depression/anxiety -Continue Buspar 15 mg po bid for anxiety.  -Continue paliperidone 6 mg po qd for mood stabilization -Continue Trazodone 50 mg nightly for insomnia  -Continue Nicoderm 21 mg topically Q 24 hrs for nicotine withdrawal management. -Continue vitamin B12 1000 mcg IM weekly for 4 weeks then monthly -Continue Melatonin 5 mg nightly for sleep -Continue Protonix EC 40 mg p.o. daily for GERD -Continue MiraLAX 17 g p.o. PRN for constipation -Continue hydroxyzine 25 mg p.o. 3 times daily as needed for anxiety -Continue Ensure nutritional shakes TID in between meals  -Previously discontinued Hydroxyzine 50 mg nightly-hypotension in the mornings   Safety and Monitoring: Voluntary admission to inpatient psychiatric unit for safety, stabilization and treatment Daily contact with patient to assess and evaluate symptoms and progress in treatment Patient's case to be discussed in multi-disciplinary team meeting Observation Level : q15 minute checks Vital signs: q12 hours Precautions:  Safety   Discharge Planning: Social work and case management to assist with discharge planning and identification of hospital follow-up needs prior to discharge Estimated LOS: Unknown at this time. Discharge Concerns: Need to establish a safety plan; Medication compliance and effectiveness Discharge Goals: Return home with outpatient referrals for mental health follow-up including medication management/psychotherapy No legal guardian, has payee  Armandina Stammer, NP,  03/29/2023, 3:11 PM Patient ID: Yolanda [name], female   DOB: 1975-08-25, 47 y.o.   MRN: [id number] Patient ID: Yolanda [name], female   DOB: Feb 28, 1976, 47 y.o.   MRN: [id number] Patient ID: Yolanda [name], female   DOB: [birth year], 47 y.o.   MRN: [id number]

## 2023-03-29 NOTE — Progress Notes (Signed)
   03/29/23 2055  Psych Admission Type (Psych Patients Only)  Admission Status Involuntary  Psychosocial Assessment  Patient Complaints Depression;Worrying  Eye Contact Fair  Facial Expression Flat  Affect Depressed  Speech Soft;Slow  Interaction Forwards little;Minimal  Motor Activity Slow  Appearance/Hygiene Unremarkable  Behavior Characteristics Cooperative;Appropriate to situation  Mood Depressed;Sad;Pleasant  Thought Process  Coherency WDL  Content WDL  Delusions None reported or observed  Perception WDL  Hallucination None reported or observed  Judgment Poor  Confusion None  Danger to Self  Current suicidal ideation? Denies  Self-Injurious Behavior No self-injurious ideation or behavior indicators observed or expressed   Agreement Not to Harm Self Yes  Description of Agreement verbally contracts for safety  Danger to Others  Danger to Others None reported or observed

## 2023-03-29 NOTE — [facility] Group Notes (Signed)
Adult Psychoeducational Group Note  Date:  03/29/2023 Time:  8:40 PM  Group Topic/Focus:  Wrap-Up Group:   The focus of this group is to help patients review their daily goal of treatment and discuss progress on daily workbooks.  Participation Level:  Active  Participation Quality:  Appropriate  Affect:  Appropriate  Cognitive:  Alert  Insight: Improving  Engagement in Group:  Engaged and Improving  Modes of Intervention:  Discussion and Support  Additional Comments:  Pt attended and participated in group  Maura Crandall Cassandra 03/29/2023, 8:40 PM

## 2023-03-29 NOTE — Progress Notes (Signed)
CSW along with patient attended a virtual meeting with Ms. Corey Skains, Belva Chimes of [facility], Ms. Hayes Ludwig of DSS and Dr. [name] to discuss latest progress concerning placement. CSW provided [facility] with scanned copies of the Comprehensive Care Assessment and Psychological Evaluation as requested. Will continue to monitor.

## 2023-03-29 NOTE — [facility] Group Notes (Signed)
The focus of this group is to help patients establish daily goals to achieve during treatment and discuss how the patient can incorporate goal setting into their daily lives to aide in recovery.    Scale 1-10:  8 out of 10   Goals: Pray

## 2023-03-30 DIAGNOSIS — F251 Schizoaffective disorder, depressive type: Secondary | ICD-10-CM | POA: Diagnosis not present

## 2023-03-30 NOTE — Progress Notes (Signed)
   03/30/23 0753  Psych Admission Type (Psych Patients Only)  Admission Status Involuntary  Psychosocial Assessment  Patient Complaints Depression;Worrying  Eye Contact Fair  Facial Expression Flat  Affect Depressed  Speech Slow;Soft  Interaction Minimal  Motor Activity Slow  Appearance/Hygiene Unremarkable  Behavior Characteristics Cooperative  Mood Depressed;Sad  Thought Process  Coherency WDL  Content WDL  Delusions None reported or observed  Perception WDL  Hallucination None reported or observed  Judgment Poor  Confusion None  Danger to Self  Current suicidal ideation? Denies  Agreement Not to Harm Self Yes  Description of Agreement Verbal  Danger to Others  Danger to Others None reported or observed

## 2023-03-30 NOTE — Plan of Care (Signed)
Problem: Education: Goal: Utilization of techniques to improve thought processes will improve Outcome: Progressing Goal: Knowledge of the prescribed therapeutic regimen will improve Outcome: Progressing   Problem: Activity: Goal: Interest or engagement in leisure activities will improve Outcome: Progressing

## 2023-03-30 NOTE — [facility] Group Notes (Signed)
LCSW Wellness Group Note     03/30/2023 10:00am   Type of Group and Topic: Psychoeducational Group:  Wellness   Participation Level:  did not attend   Description of Group   Wellness group introduces the topic and its focus on developing healthy habits across the spectrum and its relationship to a decrease in hospital admissions.  Six areas of wellness are discussed: physical, social spiritual, intellectual, occupational, and emotional.  Patients are asked to consider their current wellness habits and to identify areas of wellness where they are interested and able to focus on improvements.     Therapeutic Goals Patients will understand components of wellness and how they can positively impact overall health.  Patients will identify areas of wellness where they have developed good habits. Patients will identify areas of wellness where they would like to make improvements.      Summary of Patient Progress         Therapeutic Modalities: Cognitive Behavioral Therapy Psychoeducation       Lorri Frederick, LCSW

## 2023-03-30 NOTE — Progress Notes (Signed)
   03/30/23 0600  15 Minute Checks  Location Bedroom  Visual Appearance Calm  Behavior Sleeping  Sleep (Behavioral Health Patients Only)  Calculate sleep? (Click Yes once per 24 hr at 0600 safety check) Yes  Documented sleep last 24 hours 8

## 2023-03-30 NOTE — Progress Notes (Signed)
[facility] MD Progress Note  03/30/2023 2:21 PM Yolanda [name]  MRN:  [id number]  Principal Problem: Schizoaffective disorder, depressive type (HCC)  Diagnosis: Principal Problem:   Schizoaffective disorder, depressive type (HCC) Active Problems:   GERD (gastroesophageal reflux disease)   Intellectual disability   Tobacco use disorder  Reason for admission: This is the first psychiatric admission in this [facility] in 12 years for this 42 AA female with an extensive hx of mental illnesses & probable polysubstance use disorders. She is admitted to the [facility] from the [facility] hospital with complain of worsening suicidal ideations with plan to stab herself. Per chart review, patient apparently reported at the ED that she has been depressed for a while & has not been taking her mental health medications. After medical evaluation.clearance, she was transferred to the [facility] for further psychiatric evaluation/treatments.   24 hr chart review: Sleep Hours last night: 7.5 hours. Nursing Concerns: None. Behavioral episodes in the past 24 hrs: Remaining Isolative to the room  at times. Medication Compliance: Compliant  Vital Signs in the past 24 hrs: WNL PRN Medications in the past 24 hrs: Hydroxyzine.  Daily note: Yolanda [name] is seen in her room, chart reviewed. The chart findings discussed with the treatment team. She presents alert, oriented & aware of situation. She is visible on the unit, attending group sessions. She reports, "I'm okay, just got back from lunch. I did not get up for breakfast this morning because I was feeling a down a little because the DSS did not have any new information on getting me a place to stay. Also, they are not looking for an apartment for me. They are telling me that they are looking for group home for me. My oldest daughter will be visiting me later today. I can't wait to see her". There have not been any new changes or issues with [place]. She remains in the hospital being hopeful  that she will have a place to call home soon. She adds that although the DSS are looking to place her in a group home, that she will one day have her place (apt). She says all she needs to do is go up there to the DSS office to ask for a place of her own. She currently denies any SIHI, AVH, delusional thoughts or paranoia. She does not appear to be responding to any internal stimuli. There are no changes made on her current plan of care. Will continue as already in progress. This case is discussed with the attending psychiatrist. Vital signs remain stable.  Per the social worker's notes on meeting with DSS:   CSW along with patient attended a virtual meeting with Ms. Corey Skains, Belva Chimes of [facility], Ms. Hayes Ludwig of DSS and Dr. [name] to discuss latest progress concerning placement. CSW provided [facility] with scanned copies of the Comprehensive Care Assessment and Psychological Evaluation as requested. Will continue to monitor.            Total Time spent with patient:  35 minutes  Past Psychiatric History:  See H & P  Past Medical History:  Past Medical History:  Diagnosis Date   Bipolar affect, depressed (HCC)    Constipation 08/17/2022   Depression    Falls 07/21/2022   Fracture of femoral neck, right, closed (HCC) 01/17/2022   Herpes simplex 08/22/2017   Open fracture dislocation of right elbow joint 01/17/2022    Past Surgical History:  Procedure Laterality Date   NO PAST SURGERIES  SALPINGECTOMY     Family History: History reviewed. No pertinent family history.  Family Psychiatric  History: See H & P  Social History:  Social History   Substance and Sexual Activity  Alcohol Use Yes     Social History   Substance and Sexual Activity  Drug Use Yes   Types: Cocaine, Marijuana    Social History   Socioeconomic History   Marital status: Single    Spouse name: Not on file   Number of children: Not on file   Years of education: Not on file    Highest education level: Not on file  Occupational History   Not on file  Tobacco Use   Smoking status: Every Day   Smokeless tobacco: Not on file  Substance and Sexual Activity   Alcohol use: Yes   Drug use: Yes    Types: Cocaine, Marijuana   Sexual activity: Yes  Other Topics Concern   Not on file  Social History Narrative   Not on file   Social Determinants of Health   Financial Resource Strain: Low Risk  (09/12/2022)   Received from [facility], [facility]   Overall Financial Resource Strain (CARDIA)    Difficulty of Paying Living Expenses: Not hard at all  Food Insecurity: Patient Declined (12/20/2022)   Hunger Vital Sign    Worried About Running Out of Food in the Last Year: Patient declined    Ran Out of Food in the Last Year: Patient declined  Transportation Needs: No Transportation Needs (12/20/2022)   PRAPARE - Administrator, Civil Service (Medical): No    Lack of Transportation (Non-Medical): No  Physical Activity: Not on file  Stress: No Stress Concern Present (07/17/2022)   Received from [facility], [facility] - Occupational Stress Questionnaire    Feeling of Stress : Not at all  Social Connections: Unknown (07/16/2022)   Received from [facility], [facility]   Social Network    Social Network: Not on file   Sleep: Good  Appetite:  Good  Current Medications: Current Facility-Administered Medications  Medication Dose Route Frequency Provider Last Rate Last Admin   acetaminophen (TYLENOL) tablet 650 mg  650 mg Oral Q6H PRN [name], NP   650 mg at 03/24/23 1848   alum & mag hydroxide-simeth (MAALOX/MYLANTA) 200-200-20 MG/5ML suspension 30 mL  30 mL Oral Q4H PRN [name], NP   30 mL at 01/28/23 1530   busPIRone (BUSPAR) tablet 15 mg  15 mg Oral BID [name], NP   15 mg at 03/30/23 0753   cyanocobalamin (VITAMIN B12) injection 1,000 mcg  1,000 mcg Intramuscular Q30 days [name],  Nadir, MD   1,000 mcg at 03/04/23 1421   diphenhydrAMINE (BENADRYL) capsule 50 mg  50 mg Oral TID PRN [name], NP   50 mg at 01/15/23 1211   Or   diphenhydrAMINE (BENADRYL) injection 50 mg  50 mg Intramuscular TID PRN [name], NP       feeding supplement (ENSURE ENLIVE / ENSURE PLUS) liquid 237 mL  237 mL Oral TID BM Nkwenti, Doris, NP   237 mL at 03/29/23 2055   haloperidol (HALDOL) tablet 5 mg  5 mg Oral TID PRN [name], NP   5 mg at 01/15/23 1211   Or   haloperidol lactate (HALDOL) injection 5 mg  5 mg Intramuscular TID PRN Sanjuana Kava, NP       hydrOXYzine (ATARAX)  tablet 25 mg  25 mg Oral TID PRN [name], DO   25 mg at 03/27/23 2059   LORazepam (ATIVAN) tablet 2 mg  2 mg Oral TID PRN [name], NP   2 mg at 01/15/23 1211   Or   LORazepam (ATIVAN) injection 2 mg  2 mg Intramuscular TID PRN [name], NP       magnesium hydroxide (MILK OF MAGNESIA) suspension 30 mL  30 mL Oral Daily PRN [name], NP   30 mL at 03/28/23 2134   melatonin tablet 5 mg  5 mg Oral QHS Nkwenti, Doris, NP   5 mg at 03/29/23 2122   nicotine (NICODERM CQ - dosed in mg/24 hours) patch 21 mg  21 mg Transdermal Daily [name], DO   21 mg at 03/25/23 8469   nicotine polacrilex (NICORETTE) gum 2 mg  2 mg Oral PRN [name], MD   2 mg at 03/29/23 0839   paliperidone (INVEGA) 24 hr tablet 6 mg  6 mg Oral Daily [name], NP   6 mg at 03/29/23 2122   pantoprazole (PROTONIX) EC tablet 40 mg  40 mg Oral Daily [name], NP   40 mg at 03/30/23 0753   polyethylene glycol (MIRALAX / GLYCOLAX) packet 17 g  17 g Oral Daily PRN [name], NP   17 g at 03/20/23 1744   sertraline (ZOLOFT) tablet 150 mg  150 mg Oral Daily Massengill, Harrold Donath, MD   150 mg at 03/30/23 0753   SUMAtriptan (IMITREX) tablet 25 mg  25 mg Oral BID PRN [name], NP   25 mg at 03/20/23 1603   traZODone (DESYREL) tablet 50 mg  50 mg Oral QHS [name], NP   50 mg at 03/29/23 2122    Vitamin D (Ergocalciferol) (DRISDOL) 1.25 MG (50000 UNIT) capsule 50,000 Units  50,000 Units Oral Q7 days [name], NP   50,000 Units at 03/23/23 1823   Lab Results:  No results found for this or any previous visit (from the past 48 hour(s)).  Blood Alcohol level:  Lab Results  Component Value Date   ETH <10 12/19/2022   ETH <10 11/21/2019   Metabolic Disorder Labs: Lab Results  Component Value Date   HGBA1C 4.4 (L) 12/21/2022   MPG 79.58 12/21/2022   No results found for: "PROLACTIN" Lab Results  Component Value Date   CHOL 218 (H) 12/21/2022   TRIG 81 12/21/2022   HDL 53 12/21/2022   CHOLHDL 4.1 12/21/2022   VLDL 16 12/21/2022   LDLCALC 149 (H) 12/21/2022   LDLCALC 110 (H) 03/13/2011   Physical Findings: AIMS: Facial and Oral Movements Muscles of Facial Expression: None, normal Lips and Perioral Area: None, normal Jaw: None, normal Tongue: None, normal,Extremity Movements Upper (arms, wrists, hands, fingers): None, normal Lower (legs, knees, ankles, toes): None, normal, Trunk Movements Neck, shoulders, hips: None, normal, Overall Severity Severity of abnormal movements (highest score from questions above): None, normal Incapacitation due to abnormal movements: None, normal Patient's awareness of abnormal movements (rate only patient's report): No Awareness, Dental Status Current problems with teeth and/or dentures?: No Does patient usually wear dentures?: No  CIWA:    COWS:    AIMS:0 Musculoskeletal: Strength & Muscle Tone: within normal limits Gait & Station: normal Patient leans: N/A  Psychiatric Specialty Exam:  Presentation  General Appearance:  Appropriate for Environment; Casual; Fairly Groomed  Eye Contact: Limited to baseline Speech: Decreased amount, decreased tone and volume but at baseline Speech  Volume: Normal  Handedness: Right  Mood and Affect  Mood: Sad and depressed mainly secondary to being in the hospital with no place to  go Affect: Congruent  Thought Process  Thought Processes: Coherent; Goal Directed; Linear  Descriptions of Associations:Intact  Orientation:Full (Time, Place and Person)  Thought Content:Logical [place] History of Schizophrenia/Schizoaffective disorder:Yes  Duration of Psychotic Symptoms:Greater than six months  Hallucinations:Hallucinations: None Description of Auditory Hallucinations: NA   Denies AVH, does not appear responding to stimuli  Ideas of Reference: None  Suicidal Thoughts:Suicidal Thoughts: No SI Active Intent and/or Plan: Without Intent; Without Plan; Without Means to Carry Out; Without Access to Means SI Passive Intent and/or Plan: Without Intent; Without Plan; Without Means to Carry Out; Without Access to Means   Denies passive or active SI intention or plan   Homicidal Thoughts:Homicidal Thoughts: No   Denies HI  Sensorium  Memory: Immediate Good; Recent Good; Remote Good  Judgment: Good  Insight: Good  Executive Functions  Concentration: Good  Attention Span: Good  Recall: Good  Fund of Knowledge: Fair  Language: Fair  Psychomotor Activity  Psychomotor Activity: Psychomotor Activity: Normal   Assets  Assets: Communication Skills; Desire for Improvement; Financial Resources/Insurance; Resilience; Social Support  Sleep  Sleep: Sleep: Good Number of Hours of Sleep: 7.5   Physical Exam: Physical Exam Vitals and nursing note reviewed.  Genitourinary:    Comments: Deferred Neurological:     General: No focal deficit present.     Mental Status: She is oriented to person, place, and time.  Psychiatric:        Mood and Affect: Mood normal.        Behavior: Behavior normal.        Thought Content: Thought content normal.    Review of Systems  Constitutional:  Negative for fever.  HENT:  Negative for hearing loss.   Eyes:  Negative for blurred vision.  Respiratory:  Negative for cough.   Cardiovascular:   Negative for chest pain.  Gastrointestinal:  Negative for heartburn.  Genitourinary:  Negative for dysuria.  Musculoskeletal:  Negative for myalgias.  Skin:  Negative for rash.  Neurological:  Negative for dizziness.  Psychiatric/Behavioral:  Positive for depression. Negative for hallucinations, memory loss, substance abuse and suicidal ideas. The patient is nervous/anxious and has insomnia.   All other systems reviewed and are negative.  Blood pressure 100/64, pulse 87, temperature 97.9 F (36.6 C), temperature source Oral, resp. rate 20, height 4\' 11"  (1.499 m), weight 63 kg, SpO2 99%. Body mass index is 28.05 kg/m.  Treatment Plan Summary: Daily contact with patient to assess and evaluate symptoms and progress in treatment and Medication management.    Still waiting on safe disposition as patient would not be able to live independently given cognitive impairment. Recommend assisted living.    No medication side effects reported.  Because of urinary incontinence, recommended adult diapers to be worn.   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  Plan:  -Continue Imitrex 25 mg PRN BID for migraines -Continue Colace 100 mg daily for constipation -Continue Vitamin D 50.000 units weekly for bone health. -Continue Sertraline150 mg po Q daily for depression/anxiety -Continue Buspar 15 mg po bid for anxiety.  -Continue paliperidone 6 mg po qd for mood stabilization -Continue Trazodone 50 mg nightly for insomnia  -Continue Nicoderm 21 mg topically Q 24  hrs for nicotine withdrawal management. -Continue vitamin B12 1000 mcg IM weekly for 4 weeks then monthly -Continue Melatonin 5 mg nightly for sleep -Continue Protonix EC 40 mg p.o. daily for  GERD -Continue MiraLAX 17 g p.o. PRN for constipation -Continue hydroxyzine 25 mg p.o. 3 times daily as needed for anxiety -Continue Ensure nutritional shakes TID in between meals  -Previously discontinued Hydroxyzine 50 mg nightly-hypotension in the mornings   Safety and Monitoring: Voluntary admission to inpatient psychiatric unit for safety, stabilization and treatment Daily contact with patient to assess and evaluate symptoms and progress in treatment Patient's case to be discussed in multi-disciplinary team meeting Observation Level : q15 minute checks Vital signs: q12 hours Precautions: Safety   Discharge Planning: Social work and case management to assist with discharge planning and identification of hospital follow-up needs prior to discharge Estimated LOS: Unknown at this time. Discharge Concerns: Need to establish a safety plan; Medication compliance and effectiveness Discharge Goals: Return home with outpatient referrals for mental health follow-up including medication management/psychotherapy No legal guardian, has payee  Armandina Stammer, NP,  03/30/2023, 2:21 PM Patient ID: Yolanda [name], female   DOB: [birth year], 47 y.o.   MRN: [id number] Patient ID: FAMIE HEARST, female   DOB: 09/22/1975, 47 y.o.   MRN: [id number] Patient ID: THURSA BRANNUM, female   DOB: 1975-08-31, 47 y.o.   MRN: [id number] Patient ID: GEMA ACOMB, female   DOB: 02/10/1976, 47 y.o.   MRN: [id number]

## 2023-03-31 DIAGNOSIS — F251 Schizoaffective disorder, depressive type: Secondary | ICD-10-CM | POA: Diagnosis not present

## 2023-03-31 NOTE — Progress Notes (Signed)
   03/31/23 2311  Psych Admission Type (Psych Patients Only)  Admission Status Involuntary  Psychosocial Assessment  Patient Complaints Depression  Eye Contact Fair  Facial Expression Flat  Affect Appropriate to circumstance  Speech Logical/coherent  Interaction Minimal  Motor Activity Slow  Appearance/Hygiene Unremarkable  Behavior Characteristics Cooperative  Mood Depressed;Pleasant  Thought Process  Coherency WDL  Content WDL  Delusions None reported or observed  Perception WDL  Hallucination None reported or observed  Judgment Limited  Confusion None  Danger to Self  Current suicidal ideation? Denies  Self-Injurious Behavior No self-injurious ideation or behavior indicators observed or expressed   Agreement Not to Harm Self Yes  Description of Agreement verbal  Danger to Others  Danger to Others None reported or observed

## 2023-03-31 NOTE — Plan of Care (Signed)
Problem: Coping: Goal: Coping ability will improve Outcome: Progressing Goal: Will verbalize feelings Outcome: Progressing

## 2023-03-31 NOTE — [facility] Group Notes (Signed)
[facility] Group Notes:  (Nursing/MHT/Case Management/Adjunct)  Date:  03/31/2023  Time:  2015  Type of Therapy:   Wrap up group  Participation Level:  Active  Participation Quality:  Appropriate, Attentive, Sharing, and Supportive  Affect:  Anxious  Cognitive:  Alert  Insight:  Improving  Engagement in Group:  Engaged  Modes of Intervention:  Clarification, Education, and Support  Summary of Progress/Problems: Positive thinking and positive change were discussed. Patient shared that she had a good visit with her mom today.   [name] 03/31/2023, 9:36 PM

## 2023-03-31 NOTE — Plan of Care (Signed)
Nurse discussed coping skills with patient.

## 2023-03-31 NOTE — Progress Notes (Signed)
D:  Patient denied SI and HI, contracts for safety.  Denied A/V hallucinations.  Denied pain.  Patient sleeps good.  Good appetite, low energy level, poor concentration.  Rated depression, anxiety and hopeless 5.  Denied withdrawals.  Denied SI.  Denied physical problems.  Denied physical pain.  Goal is attend groups.  Plans to get out of bed and interact with peers and staff today.  No discharge plans. A:  Medications administered per MD orders.  Emotional support and encouragement given patient. R:  Safety maintained with 15 minute checks.

## 2023-03-31 NOTE — Progress Notes (Signed)
[facility] MD Progress Note  03/31/2023 4:04 PM Yolanda [name]  MRN:  [id number]  Principal Problem: Schizoaffective disorder, depressive type (HCC)  Diagnosis: Principal Problem:   Schizoaffective disorder, depressive type (HCC) Active Problems:   GERD (gastroesophageal reflux disease)   Intellectual disability   Tobacco use disorder  Reason for admission: This is the first psychiatric admission in this [facility] in 12 years for this 47 AA female with an extensive hx of mental illnesses & probable polysubstance use disorders. She is admitted to the [facility] from the [facility] hospital with complain of worsening suicidal ideations with plan to stab herself. Per chart review, patient apparently reported at the ED that she has been depressed for a while & has not been taking her mental health medications. After medical evaluation.clearance, she was transferred to the [facility] for further psychiatric evaluation/treatments.   24 hr chart review: Sleep Hours last night: 7.5 hours. Nursing Concerns: None. Behavioral episodes in the past 24 hrs: Remaining Isolative to the room  at times. Medication Compliance: Compliant  Vital Signs in the past 24 hrs: WNL PRN Medications in the past 24 hrs: Hydroxyzine.  Daily note: Yolanda [name] is seen in her room, chart reviewed. The chart findings discussed with the treatment team. She presents alert, oriented & aware of situation. She is visible on the unit, attending group sessions. She reports, "I'm doing okay today. Just resting a little. I will be getting up soon". There have not been any new changes or issues with Yolanda [name]. She remains in the hospital being hopeful that she will have a place to call home soon. This case is with the DSS. The meeting held with the DSS this past week with Yolanda [name], the SW & attending psychiatrist indicated that there have not been any group home allocated for Yolanda [name] as of yet. The staff continues to encourage Yolanda [name] to be patient. She currently  denies any SIHI, AVH, delusional thoughts or paranoia. She does not appear to be responding to any internal stimuli. There are no changes made on her current plan of care. Will continue as already in progress. There are no behavioral problems being displayed by Yolanda [name].  This case is discussed with the attending psychiatrist. Vital signs remain stable. Continue with the current plan of care as already in progress.  Total Time spent with patient:  35 minutes  Past Psychiatric History:  See H & P  Past Medical History:  Past Medical History:  Diagnosis Date   Bipolar affect, depressed (HCC)    Constipation 08/17/2022   Depression    Falls 07/21/2022   Fracture of femoral neck, right, closed (HCC) 01/17/2022   Herpes simplex 08/22/2017   Open fracture dislocation of right elbow joint 01/17/2022    Past Surgical History:  Procedure Laterality Date   NO PAST SURGERIES     SALPINGECTOMY     Family History: History reviewed. No pertinent family history.  Family Psychiatric  History: See H & P  Social History:  Social History   Substance and Sexual Activity  Alcohol Use Yes     Social History   Substance and Sexual Activity  Drug Use Yes   Types: Cocaine, Marijuana    Social History   Socioeconomic History   Marital status: Single    Spouse name: Not on file   Number of children: Not on file   Years of education: Not on file   Highest education level: Not on file  Occupational History   Not on file  Tobacco  Use   Smoking status: Every Day   Smokeless tobacco: Not on file  Substance and Sexual Activity   Alcohol use: Yes   Drug use: Yes    Types: Cocaine, Marijuana   Sexual activity: Yes  Other Topics Concern   Not on file  Social History Narrative   Not on file   Social Determinants of Health   Financial Resource Strain: Low Risk  (09/12/2022)   Received from [facility], [facility]   Overall Financial Resource Strain (CARDIA)    Difficulty of Paying  Living Expenses: Not hard at all  Food Insecurity: Patient Declined (12/20/2022)   Hunger Vital Sign    Worried About Running Out of Food in the Last Year: Patient declined    Ran Out of Food in the Last Year: Patient declined  Transportation Needs: No Transportation Needs (12/20/2022)   PRAPARE - Administrator, Civil Service (Medical): No    Lack of Transportation (Non-Medical): No  Physical Activity: Not on file  Stress: No Stress Concern Present (07/17/2022)   Received from [facility], [facility] - Occupational Stress Questionnaire    Feeling of Stress : Not at all  Social Connections: Unknown (07/16/2022)   Received from [facility], [facility]   Social Network    Social Network: Not on file   Sleep: Good  Appetite:  Good  Current Medications: Current Facility-Administered Medications  Medication Dose Route Frequency Provider Last Rate Last Admin   acetaminophen (TYLENOL) tablet 650 mg  650 mg Oral Q6H PRN [name], NP   650 mg at 03/24/23 1848   alum & mag hydroxide-simeth (MAALOX/MYLANTA) 200-200-20 MG/5ML suspension 30 mL  30 mL Oral Q4H PRN [name], NP   30 mL at 01/28/23 1530   busPIRone (BUSPAR) tablet 15 mg  15 mg Oral BID [name], NP   15 mg at 03/31/23 0818   cyanocobalamin (VITAMIN B12) injection 1,000 mcg  1,000 mcg Intramuscular Q30 days [name], Nadir, MD   1,000 mcg at 03/04/23 1421   diphenhydrAMINE (BENADRYL) capsule 50 mg  50 mg Oral TID PRN [name], NP   50 mg at 01/15/23 1211   Or   diphenhydrAMINE (BENADRYL) injection 50 mg  50 mg Intramuscular TID PRN [name], NP       feeding supplement (ENSURE ENLIVE / ENSURE PLUS) liquid 237 mL  237 mL Oral TID BM Nkwenti, Doris, NP   237 mL at 03/31/23 1409   haloperidol (HALDOL) tablet 5 mg  5 mg Oral TID PRN [name], NP   5 mg at 01/15/23 1211   Or   haloperidol lactate (HALDOL) injection 5 mg  5 mg Intramuscular TID PRN  [name], NP       hydrOXYzine (ATARAX) tablet 25 mg  25 mg Oral TID PRN [name], DO   25 mg at 03/27/23 2059   LORazepam (ATIVAN) tablet 2 mg  2 mg Oral TID PRN [name], NP   2 mg at 01/15/23 1211   Or   LORazepam (ATIVAN) injection 2 mg  2 mg Intramuscular TID PRN [name], NP       magnesium hydroxide (MILK OF MAGNESIA) suspension 30 mL  30 mL Oral Daily PRN [name], NP   30 mL at 03/28/23 2134   melatonin tablet 5 mg  5 mg Oral QHS [name], NP   5 mg at 03/30/23 2046   nicotine (  NICODERM CQ - dosed in mg/24 hours) patch 21 mg  21 mg Transdermal Daily [name], DO   21 mg at 03/25/23 3086   nicotine polacrilex (NICORETTE) gum 2 mg  2 mg Oral PRN [name], MD   2 mg at 03/30/23 1709   paliperidone (INVEGA) 24 hr tablet 6 mg  6 mg Oral Daily Nkwenti, Tyler Aas, NP   6 mg at 03/30/23 2046   pantoprazole (PROTONIX) EC tablet 40 mg  40 mg Oral Daily [name], NP   40 mg at 03/31/23 0819   polyethylene glycol (MIRALAX / GLYCOLAX) packet 17 g  17 g Oral Daily PRN [name], NP   17 g at 03/20/23 1744   sertraline (ZOLOFT) tablet 150 mg  150 mg Oral Daily Massengill, Harrold Donath, MD   150 mg at 03/31/23 5784   SUMAtriptan (IMITREX) tablet 25 mg  25 mg Oral BID PRN [name], NP   25 mg at 03/20/23 1603   traZODone (DESYREL) tablet 50 mg  50 mg Oral QHS [name], NP   50 mg at 03/30/23 2046   Vitamin D (Ergocalciferol) (DRISDOL) 1.25 MG (50000 UNIT) capsule 50,000 Units  50,000 Units Oral Q7 days [name], NP   50,000 Units at 03/30/23 1517   Lab Results:  No results found for this or any previous visit (from the past 48 hour(s)).  Blood Alcohol level:  Lab Results  Component Value Date   ETH <10 12/19/2022   ETH <10 11/21/2019   Metabolic Disorder Labs: Lab Results  Component Value Date   HGBA1C 4.4 (L) 12/21/2022   MPG 79.58 12/21/2022   No results found for: "PROLACTIN" Lab Results  Component Value Date   CHOL 218 (H)  12/21/2022   TRIG 81 12/21/2022   HDL 53 12/21/2022   CHOLHDL 4.1 12/21/2022   VLDL 16 12/21/2022   LDLCALC 149 (H) 12/21/2022   LDLCALC 110 (H) 03/13/2011   Physical Findings: AIMS: Facial and Oral Movements Muscles of Facial Expression: None, normal Lips and Perioral Area: None, normal Jaw: None, normal Tongue: None, normal,Extremity Movements Upper (arms, wrists, hands, fingers): None, normal Lower (legs, knees, ankles, toes): None, normal, Trunk Movements Neck, shoulders, hips: None, normal, Overall Severity Severity of abnormal movements (highest score from questions above): None, normal Incapacitation due to abnormal movements: None, normal Patient's awareness of abnormal movements (rate only patient's report): No Awareness, Dental Status Current problems with teeth and/or dentures?: No Does patient usually wear dentures?: No  CIWA:    COWS:    AIMS:0 Musculoskeletal: Strength & Muscle Tone: within normal limits Gait & Station: normal Patient leans: N/A  Psychiatric Specialty Exam:  Presentation  General Appearance:  Casual; Fairly Groomed  Eye Contact: Limited to baseline Speech: Decreased amount, decreased tone and volume but at baseline Speech Volume: Normal  Handedness: Right  Mood and Affect  Mood: Sad and depressed mainly secondary to being in the hospital with no place to go Affect: Congruent  Thought Process  Thought Processes: Coherent; Goal Directed  Descriptions of Associations:Intact  Orientation:Full (Time, Place and Person)  Thought Content:Logical [place] History of Schizophrenia/Schizoaffective disorder:Yes  Duration of Psychotic Symptoms:Greater than six months  Hallucinations:Hallucinations: None Description of Auditory Hallucinations: NA   Ideas of Reference: None  Suicidal Thoughts:Suicidal Thoughts: No SI Active Intent and/or Plan: Without Intent; Without Plan; Without Means to Carry Out; Without Access to Means SI  Passive Intent and/or Plan: Without Intent; Without Plan; Without Means to Carry Out; Without Access to  Means   Homicidal Thoughts:Homicidal Thoughts: No  Sensorium  Memory: Immediate Good; Recent Good; Remote Good  Judgment: Fair  Insight: Good  Executive Functions  Concentration: Good  Attention Span: Good  Recall: Good  Fund of Knowledge: Fair  Language: Good  Psychomotor Activity  Psychomotor Activity: Psychomotor Activity: Normal    Assets  Assets: Communication Skills; Desire for Improvement; Financial Resources/Insurance; Resilience; Physical Health; Social Support  Sleep  Sleep: Sleep: Good Number of Hours of Sleep: 8  Physical Exam: Physical Exam Vitals and nursing note reviewed.  HENT:     Nose: Nose normal.     Mouth/Throat:     Pharynx: Oropharynx is clear.  Eyes:     Pupils: Pupils are equal, round, and reactive to light.  Cardiovascular:     Rate and Rhythm: Normal rate.     Pulses: Normal pulses.  Pulmonary:     Effort: Pulmonary effort is normal.  Genitourinary:    Comments: Deferred Musculoskeletal:        General: Normal range of motion.     Cervical back: Normal range of motion.  Skin:    General: Skin is warm and dry.  Neurological:     General: No focal deficit present.     Mental Status: She is alert and oriented to person, place, and time.  Psychiatric:        Mood and Affect: Mood normal.        Behavior: Behavior normal.        Thought Content: Thought content normal.    Review of Systems  Constitutional:  Negative for chills, diaphoresis and fever.  HENT:  Negative for congestion, hearing loss and sore throat.   Eyes:  Negative for blurred vision.  Respiratory:  Negative for cough, shortness of breath and wheezing.   Cardiovascular:  Negative for chest pain and palpitations.  Gastrointestinal:  Negative for abdominal pain, constipation, diarrhea, heartburn, nausea and vomiting.  Genitourinary:  Negative for  dysuria.  Musculoskeletal:  Negative for joint pain and myalgias.  Skin:  Negative for rash.  Neurological:  Negative for dizziness.  Psychiatric/Behavioral:  Positive for depression. Negative for hallucinations, memory loss, substance abuse and suicidal ideas. The patient is nervous/anxious and has insomnia.   All other systems reviewed and are negative.  Blood pressure 90/76, pulse (!) 103, temperature 98 F (36.7 C), resp. rate 20, height 4\' 11"  (1.499 m), weight 63 kg, SpO2 100%. Body mass index is 28.05 kg/m.  Treatment Plan Summary: Daily contact with patient to assess and evaluate symptoms and progress in treatment and Medication management.    Still waiting on safe disposition as patient would not be able to live independently given cognitive impairment. Recommend assisted living.    No medication side effects reported.  Because of urinary incontinence, recommended adult diapers to be worn.   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  Plan:  -Continue Imitrex 25 mg PRN BID for migraines -Continue Colace 100 mg daily for constipation -Continue Vitamin D 50.000 units weekly for bone health. -Continue Sertraline150 mg po Q daily for depression/anxiety -Continue Buspar 15 mg po bid for anxiety.  -Continue paliperidone 6 mg po qd for mood stabilization -Continue Trazodone 50 mg nightly for insomnia  -Continue Nicoderm 21 mg topically Q 24 hrs for nicotine withdrawal management. -Continue vitamin B12 1000 mcg IM weekly for 4 weeks  then monthly -Continue Melatonin 5 mg nightly for sleep -Continue Protonix EC 40 mg p.o. daily for GERD -Continue MiraLAX 17 g p.o. PRN for constipation -Continue hydroxyzine 25 mg p.o. 3 times daily as needed for  anxiety -Continue Ensure nutritional shakes TID in between meals  -Previously discontinued Hydroxyzine 50 mg nightly-hypotension in the mornings   Safety and Monitoring: Voluntary admission to inpatient psychiatric unit for safety, stabilization and treatment Daily contact with patient to assess and evaluate symptoms and progress in treatment Patient's case to be discussed in multi-disciplinary team meeting Observation Level : q15 minute checks Vital signs: q12 hours Precautions: Safety   Discharge Planning: Social work and case management to assist with discharge planning and identification of hospital follow-up needs prior to discharge Estimated LOS: Unknown at this time. Discharge Concerns: Need to establish a safety plan; Medication compliance and effectiveness Discharge Goals: Return home with outpatient referrals for mental health follow-up including medication management/psychotherapy No legal guardian, has payee  Armandina Stammer, NP,  03/31/2023, 4:04 PM Patient ID: Larey Brick, female   DOB: 22-Oct-1975, 47 y.o.   MRN: [id number] Patient ID: PRICELLA VOSKUIL, female   DOB: Jul 27, 1975, 47 y.o.   MRN: [id number] Patient ID: RELMA WIACEK, female   DOB: October 08, 1975, 47 y.o.   MRN: [id number] Patient ID: DARBY ANGELLE, female   DOB: 1976/04/20, 47 y.o.   MRN: [id number] Patient ID: LOLLIE LACEK, female   DOB: [birth year], 47 y.o.   MRN: [id number]

## 2023-03-31 NOTE — [facility] Group Notes (Signed)
Pt did not attend goals group.

## 2023-04-01 ENCOUNTER — Encounter (HOSPITAL_COMMUNITY): Payer: Self-pay

## 2023-04-01 DIAGNOSIS — F251 Schizoaffective disorder, depressive type: Secondary | ICD-10-CM | POA: Diagnosis not present

## 2023-04-01 NOTE — [facility] Group Notes (Signed)
[facility] Group Notes:  (Nursing/MHT/Case Management/Adjunct)  Date:  04/01/2023  Time:  9:50 PM  Type of Therapy:  Group Therapy  Participation Level:  Did Not Attend  Participation Quality:   n/a  Affect:   n/a  Cognitive:   n/a  Insight:  None  Engagement in Group:   n/a  Modes of Intervention:   n/a  Summary of Progress/Problems: Pt did not attend AA Group Meeting  Nita Whitmire E Santonio Speakman 04/01/2023, 9:50 PM

## 2023-04-01 NOTE — BH IP Treatment Plan (Signed)
Interdisciplinary Treatment and Diagnostic Plan Update  04/01/2023 Time of Session: 9:00AM (UPDATE) [name] MRN: [id number]  Principal Diagnosis: Schizoaffective disorder, depressive type (HCC)  Secondary Diagnoses: Principal Problem:   Schizoaffective disorder, depressive type (HCC) Active Problems:   GERD (gastroesophageal reflux disease)   Intellectual disability   Tobacco use disorder   Current Medications:  Current Facility-Administered Medications  Medication Dose Route Frequency Provider Last Rate Last Admin   acetaminophen (TYLENOL) tablet 650 mg  650 mg Oral Q6H PRN [name], NP   650 mg at 03/24/23 1848   alum & mag hydroxide-simeth (MAALOX/MYLANTA) 200-200-20 MG/5ML suspension 30 mL  30 mL Oral Q4H PRN [name], NP   30 mL at 01/28/23 1530   busPIRone (BUSPAR) tablet 15 mg  15 mg Oral BID [name], NP   15 mg at 03/31/23 1724   cyanocobalamin (VITAMIN B12) injection 1,000 mcg  1,000 mcg Intramuscular Q30 days [name], Nadir, MD   1,000 mcg at 03/04/23 1421   diphenhydrAMINE (BENADRYL) capsule 50 mg  50 mg Oral TID PRN [name], NP   50 mg at 01/15/23 1211   Or   diphenhydrAMINE (BENADRYL) injection 50 mg  50 mg Intramuscular TID PRN [name], NP       feeding supplement (ENSURE ENLIVE / ENSURE PLUS) liquid 237 mL  237 mL Oral TID BM Nkwenti, Doris, NP   237 mL at 03/31/23 2108   haloperidol (HALDOL) tablet 5 mg  5 mg Oral TID PRN [name], NP   5 mg at 01/15/23 1211   Or   haloperidol lactate (HALDOL) injection 5 mg  5 mg Intramuscular TID PRN [name], NP       hydrOXYzine (ATARAX) tablet 25 mg  25 mg Oral TID PRN [name], DO   25 mg at 03/27/23 2059   LORazepam (ATIVAN) tablet 2 mg  2 mg Oral TID PRN [name], NP   2 mg at 01/15/23 1211   Or   LORazepam (ATIVAN) injection 2 mg  2 mg Intramuscular TID PRN [name], NP       magnesium hydroxide (MILK OF MAGNESIA) suspension 30 mL  30 mL Oral Daily PRN [name], NP   30 mL at 03/28/23 2134   melatonin tablet 5 mg  5 mg Oral QHS Nkwenti, Doris, NP   5 mg at 03/31/23 2108   nicotine (NICODERM CQ - dosed in mg/24 hours) patch 21 mg  21 mg Transdermal Daily [name], DO   21 mg at 03/25/23 7829   nicotine polacrilex (NICORETTE) gum 2 mg  2 mg Oral PRN [name], MD   2 mg at 03/31/23 1812   paliperidone (INVEGA) 24 hr tablet 6 mg  6 mg Oral Daily [name], NP   6 mg at 03/31/23 2108   pantoprazole (PROTONIX) EC tablet 40 mg  40 mg Oral Daily [name], NP   40 mg at 03/31/23 0819   polyethylene glycol (MIRALAX / GLYCOLAX) packet 17 g  17 g Oral Daily PRN [name], NP   17 g at 03/31/23 1812   sertraline (ZOLOFT) tablet 150 mg  150 mg Oral Daily Massengill, Harrold Donath, MD   150 mg at 03/31/23 0819   SUMAtriptan (IMITREX) tablet 25 mg  25 mg Oral BID PRN [name], NP   25 mg at 03/20/23 1603   traZODone (DESYREL) tablet 50 mg  50 mg Oral QHS [name], NP   50 mg  at 03/31/23 2108   Vitamin D (Ergocalciferol) (DRISDOL) 1.25 MG (50000 UNIT) capsule 50,000 Units  50,000 Units Oral Q7 days [name], NP   50,000 Units at 03/30/23 1517   PTA Medications: Medications Prior to Admission  Medication Sig Dispense Refill Last Dose   busPIRone (BUSPAR) 15 MG tablet Take 15 mg by mouth 2 (two) times daily. (Patient not taking: Reported on 12/19/2022)      paliperidone (INVEGA SUSTENNA) 156 MG/ML SUSY injection Inject 156 mg into the muscle once. (Patient not taking: Reported on 12/19/2022)      sertraline (ZOLOFT) 50 MG tablet Take 150 mg by mouth daily. (Patient not taking: Reported on 12/19/2022)      traZODone (DESYREL) 100 MG tablet Take 100 mg by mouth at bedtime as needed for sleep. (Patient not taking: Reported on 11/12/2022)       Patient Stressors: Medication change or noncompliance    Patient Strengths: Forensic psychologist fund of knowledge   Treatment Modalities: Medication Management, Group therapy, Case  management,  1 to 1 session with clinician, Psychoeducation, Recreational therapy.   Physician Treatment Plan for Primary Diagnosis: Schizoaffective disorder, depressive type (HCC) Long Term Goal(s): Improvement in symptoms so as ready for discharge   Short Term Goals: Ability to identify and develop effective coping behaviors will improve Ability to maintain clinical measurements within normal limits will improve Compliance with prescribed medications will improve Ability to identify triggers associated with substance abuse/mental health issues will improve Ability to identify changes in lifestyle to reduce recurrence of condition will improve Ability to verbalize feelings will improve Ability to disclose and discuss suicidal ideas Ability to demonstrate self-control will improve  Medication Management: Evaluate patient's response, side effects, and tolerance of medication regimen.  Therapeutic Interventions: 1 to 1 sessions, Unit Group sessions and Medication administration.  Evaluation of Outcomes: Progressing  Physician Treatment Plan for Secondary Diagnosis: Principal Problem:   Schizoaffective disorder, depressive type (HCC) Active Problems:   GERD (gastroesophageal reflux disease)   Intellectual disability   Tobacco use disorder  Long Term Goal(s): Improvement in symptoms so as ready for discharge   Short Term Goals: Ability to identify and develop effective coping behaviors will improve Ability to maintain clinical measurements within normal limits will improve Compliance with prescribed medications will improve Ability to identify triggers associated with substance abuse/mental health issues will improve Ability to identify changes in lifestyle to reduce recurrence of condition will improve Ability to verbalize feelings will improve Ability to disclose and discuss suicidal ideas Ability to demonstrate self-control will improve     Medication Management: Evaluate  patient's response, side effects, and tolerance of medication regimen.  Therapeutic Interventions: 1 to 1 sessions, Unit Group sessions and Medication administration.  Evaluation of Outcomes: Progressing   RN Treatment Plan for Primary Diagnosis: Schizoaffective disorder, depressive type (HCC) Long Term Goal(s): Knowledge of disease and therapeutic regimen to maintain health will improve  Short Term Goals: Ability to remain free from injury will improve, Ability to verbalize frustration and anger appropriately will improve, Ability to participate in decision making will improve, Ability to verbalize feelings will improve, Ability to identify and develop effective coping behaviors will improve, and Compliance with prescribed medications will improve  Medication Management: RN will administer medications as ordered by provider, will assess and evaluate patient's response and provide education to patient for prescribed medication. RN will report any adverse and/or side effects to prescribing provider.  Therapeutic Interventions: 1 on 1 counseling sessions, Psychoeducation, Medication administration, Evaluate  responses to treatment, Monitor vital signs and CBGs as ordered, Perform/monitor CIWA, COWS, AIMS and Fall Risk screenings as ordered, Perform wound care treatments as ordered.  Evaluation of Outcomes: Progressing   LCSW Treatment Plan for Primary Diagnosis: Schizoaffective disorder, depressive type (HCC) Long Term Goal(s): Safe transition to appropriate next level of care at discharge, Engage patient in therapeutic group addressing interpersonal concerns.  Short Term Goals: Engage patient in aftercare planning with referrals and resources, Increase social support, Increase emotional regulation, Facilitate acceptance of mental health diagnosis and concerns, Identify triggers associated with mental health/substance abuse issues, and Increase skills for wellness and recovery  Therapeutic  Interventions: Assess for all discharge needs, 1 to 1 time with Social worker, Explore available resources and support systems, Assess for adequacy in community support network, Educate family and significant other(s) on suicide prevention, Complete Psychosocial Assessment, Interpersonal group therapy.  Evaluation of Outcomes: Progressing   Progress in Treatment: Attending groups: Yes. Participating in groups: Yes. Taking medication as prescribed: Yes. Toleration medication: Yes. Family/Significant other contact made: Yes, individual(s) contacted:  Tamera Reason (Mother) Patient understands diagnosis: Yes. Discussing patient identified problems/goals with staff: Yes. Medical problems stabilized or resolved: Yes. Denies suicidal/homicidal ideation: Yes. Issues/concerns per patient self-inventory: Yes.    New problem(s) identified: No, Describe:  None reported   New Short Term/Long Term Goal(s): medication stabilization, elimination of SI thoughts, development of comprehensive mental wellness plan.    Patient Goals:  Coping Skills/Housing   Discharge Plan or Barriers: CSW will continue to follow and assess for appropriate referrals and possible discharge planning.    Reason for Continuation of Hospitalization: Depression Medication stabilization Suicidal ideation   Estimated Length of Stay: 3-7 Days   Last 3 [place] Suicide Severity Risk Score: Flowsheet Row Ad[facility] (Current) [facility] from [facility]19/20[facility] in [facility] from 11/12/2022 in [facility] Emergency Department at [facility]  C-SSRS RISK CATEGORY High Risk High Risk Moderate Risk       Last [facility] 2/9 Scores:     No data to display          Scribe for Treatment Team: Izell Kingston, [place] 04/01/2023 9:37 AM

## 2023-04-01 NOTE — BHH Group Notes (Signed)
Spiritual care group on grief and loss facilitated by Chaplain Dyanne Carrel, Bcc and Arlyce Dice, Mdiv  Group Goal: Support / Education around grief and loss  Members engage in facilitated group support and psycho-social education.  Group Description:  Following introductions and group rules, group members engaged in facilitated group dialogue and support around topic of loss, with particular support around experiences of loss in their lives. Group Identified types of loss (relationships / self / things) and identified patterns, circumstances, and changes that precipitate losses. Reflected on thoughts / feelings around loss, normalized grief responses, and recognized variety in grief experience. Group encouraged individual reflection on safe space and on the coping skills that they are already utilizing.  Group drew on Adlerian / Rogerian and narrative framework  Patient Progress: Did not attend.

## 2023-04-01 NOTE — Progress Notes (Signed)
   04/01/23 0531  15 Minute Checks  Location Bedroom  Visual Appearance Calm  Behavior Sleeping  Sleep (Behavioral Health Patients Only)  Calculate sleep? (Click Yes once per 24 hr at 0600 safety check) Yes  Documented sleep last 24 hours 8

## 2023-04-01 NOTE — Plan of Care (Signed)
Problem: Education: Goal: Utilization of techniques to improve thought processes will improve Outcome: Progressing   Problem: Education: Goal: Knowledge of the prescribed therapeutic regimen will improve Outcome: Progressing

## 2023-04-01 NOTE — Group Note (Signed)
Recreation Therapy Group Note   Group Topic:Stress Management  Group Date: 04/01/2023 Start Time: 0935 End Time: 0950 Facilitators: Lakai Moree-McCall, LRT,CTRS Location: 300 Hall Dayroom   Group Topic: Stress Management   Goal Area(s) Addresses:  Patient will actively participate in stress management techniques presented during session.  Patient will successfully identify benefit of practicing stress management post d/c.   Intervention: Relaxation exercise with ambient sound and script   Group Description: Guided Imagery. LRT provided education, instruction, and demonstration on practice of visualization via guided imagery. Patient was asked to participate in the technique introduced during session. LRT debriefed including topics of mindfulness, stress management and specific scenarios each patient could use these techniques. Patients were given suggestions of ways to access scripts post d/c and encouraged to explore Youtube and other apps available on smartphones, tablets, and computers.  Education:  Stress Management, Discharge Planning.   Education Outcome: Acknowledges education   Affect/Mood: N/A   Participation Level: Did not attend    Clinical Observations/Individualized Feedback:     Plan: Continue to engage patient in RT group sessions 2-3x/week.   Zenda Herskowitz-McCall, LRT,CTRS 04/01/2023 12:02 PM

## 2023-04-01 NOTE — Progress Notes (Signed)
[facility] MD Progress Note  04/01/2023 3:58 PM Yolanda [name]  MRN:  [id number]  Principal Problem: Schizoaffective disorder, depressive type (HCC)  Diagnosis: Principal Problem:   Schizoaffective disorder, depressive type (HCC) Active Problems:   GERD (gastroesophageal reflux disease)   Intellectual disability   Tobacco use disorder  Reason for admission: This is the first psychiatric admission in this [facility] in 12 years for this 82 AA female with an extensive hx of mental illnesses & probable polysubstance use disorders. She is admitted to the [facility] from the [facility] hospital with complain of worsening suicidal ideations with plan to stab herself. Per chart review, patient apparently reported at the ED that she has been depressed for a while & has not been taking her mental health medications. After medical evaluation.clearance, she was transferred to the [facility] for further psychiatric evaluation/treatments.   24 hr chart review: Sleep Hours last night: 8 hours night Nursing Concerns: None. Behavioral episodes in the past 24 hrs: Remaining Isolative to the room. Medication Compliance: Compliant  Vital Signs in the past 24 hrs: Blood pressure slightly low earlier today morning at 95/56.  Patient's assigned RN will be notified to recheck, and encourage fluids. PRN Medications in the past 24 hrs: none  Patient assessment note: Pt with flat affect and depressed mood, attention to personal hygiene and grooming is poor, patient observed to be lying on her bed with no linens.  Pt reports that she accidentally urinated on the bed and changed herself, but had not yet made up her bed at the time of this assessment. Positive reinforcements were given for patient to change get linens from the nursing staff and make up her bed.  Today, the pt reports that their mood is still depressed, and she remains depressed as per objective assessment. Reports that anxiety is moderate Sleep is fair Appetite is poor  related to still being hospitalized, and not liking hospital food. Concentration is fair as per patient, but poor as per objective assessment. Energy level is normal as per patient, but for as per objective assessment, as patient seems disinterested during assessment, and requires multiple verbal positive reinforcements to remain engaged during the entire encounter. Denies suicidal thoughts.  Denies suicidal intent and plan.  Denies having any HI.  Denies having psychotic symptoms.   Denies having side effects to current psychiatric medications.  We discussed keeping medications same as below, with no changes today.  Discussed the following psychosocial stressors: Continuous hospitalization.  Education provided on the fact that we understand that hospitalization has been prolonged, but will continue to be in contact with DSS and are coordinating with her CSW for a safe placement in the community.  Patient verbalizes understanding.  She is encouraged to come out of the room, and engage in activities in the day room.  Personal hygiene is also encouraged, along with taking charge of cleaning her room, taking 30 minutes out to the cart in the hallway, and  organizing her personal belongings.  Patient verbalizes understanding.  She is educated that it might become necessary to leave her room so that she can engage in activities, should she not start engaging in activities herself.  Continues hospitalization remains necessary at this time, to ensure patient's safety, as there continues to be no safe placement in the community at this time as per DSS.  Total Time spent with patient:  35 minutes  Past Psychiatric History:  See H & P  Past Medical History:  Past Medical History:  Diagnosis  Date   Bipolar affect, depressed (HCC)    Constipation 08/17/2022   Depression    Falls 07/21/2022   Fracture of femoral neck, right, closed (HCC) 01/17/2022   Herpes simplex 08/22/2017   Open fracture dislocation  of right elbow joint 01/17/2022    Past Surgical History:  Procedure Laterality Date   NO PAST SURGERIES     SALPINGECTOMY     Family History: History reviewed. No pertinent family history.  Family Psychiatric  History: See H & P  Social History:  Social History   Substance and Sexual Activity  Alcohol Use Yes     Social History   Substance and Sexual Activity  Drug Use Yes   Types: Cocaine, Marijuana    Social History   Socioeconomic History   Marital status: Single    Spouse name: Not on file   Number of children: Not on file   Years of education: Not on file   Highest education level: Not on file  Occupational History   Not on file  Tobacco Use   Smoking status: Every Day   Smokeless tobacco: Not on file  Substance and Sexual Activity   Alcohol use: Yes   Drug use: Yes    Types: Cocaine, Marijuana   Sexual activity: Yes  Other Topics Concern   Not on file  Social History Narrative   Not on file   Social Determinants of Health   Financial Resource Strain: Low Risk  (09/12/2022)   Received from [facility], [facility]   Overall Financial Resource Strain (CARDIA)    Difficulty of Paying Living Expenses: Not hard at all  Food Insecurity: Patient Declined (12/20/2022)   Hunger Vital Sign    Worried About Running Out of Food in the Last Year: Patient declined    Ran Out of Food in the Last Year: Patient declined  Transportation Needs: No Transportation Needs (12/20/2022)   PRAPARE - Administrator, Civil Service (Medical): No    Lack of Transportation (Non-Medical): No  Physical Activity: Not on file  Stress: No Stress Concern Present (07/17/2022)   Received from [facility], [facility] of Occupational Health - Occupational Stress Questionnaire    Feeling of Stress : Not at all  Social Connections: Unknown (07/16/2022)   Received from [facility], [facility]   Social Network    Social Network: Not on file    Sleep: Good  Appetite:  Good  Current Medications: Current Facility-Administered Medications  Medication Dose Route Frequency Provider Last Rate Last Admin   acetaminophen (TYLENOL) tablet 650 mg  650 mg Oral Q6H PRN [name], NP   650 mg at 03/24/23 1848   alum & mag hydroxide-simeth (MAALOX/MYLANTA) 200-200-20 MG/5ML suspension 30 mL  30 mL Oral Q4H PRN [name], NP   30 mL at 01/28/23 1530   busPIRone (BUSPAR) tablet 15 mg  15 mg Oral BID [name], NP   15 mg at 04/01/23 0950   cyanocobalamin (VITAMIN B12) injection 1,000 mcg  1,000 mcg Intramuscular Q30 days Sarita Bottom, MD   1,000 mcg at 03/04/23 1421   diphenhydrAMINE (BENADRYL) capsule 50 mg  50 mg Oral TID PRN [name], NP   50 mg at 01/15/23 1211   Or   diphenhydrAMINE (BENADRYL) injection 50 mg  50 mg Intramuscular TID PRN [name], NP       feeding supplement (ENSURE ENLIVE / ENSURE PLUS) liquid 237 mL  237 mL Oral TID  BM [name], NP   237 mL at 04/01/23 0950   haloperidol (HALDOL) tablet 5 mg  5 mg Oral TID PRN [name], NP   5 mg at 01/15/23 1211   Or   haloperidol lactate (HALDOL) injection 5 mg  5 mg Intramuscular TID PRN [name], NP       hydrOXYzine (ATARAX) tablet 25 mg  25 mg Oral TID PRN [name], DO   25 mg at 03/27/23 2059   LORazepam (ATIVAN) tablet 2 mg  2 mg Oral TID PRN [name], NP   2 mg at 01/15/23 1211   Or   LORazepam (ATIVAN) injection 2 mg  2 mg Intramuscular TID PRN [name], NP       magnesium hydroxide (MILK OF MAGNESIA) suspension 30 mL  30 mL Oral Daily PRN [name], NP   30 mL at 03/28/23 2134   melatonin tablet 5 mg  5 mg Oral QHS Sahalie Beth, NP   5 mg at 03/31/23 2108   nicotine (NICODERM CQ - dosed in mg/24 hours) patch 21 mg  21 mg Transdermal Daily [name], DO   21 mg at 03/25/23 5956   nicotine polacrilex (NICORETTE) gum 2 mg  2 mg Oral PRN [name], MD   2 mg at 04/01/23 3875   paliperidone (INVEGA) 24 hr  tablet 6 mg  6 mg Oral Daily [name], NP   6 mg at 03/31/23 2108   pantoprazole (PROTONIX) EC tablet 40 mg  40 mg Oral Daily [name], NP   40 mg at 04/01/23 0950   polyethylene glycol (MIRALAX / GLYCOLAX) packet 17 g  17 g Oral Daily PRN [name], NP   17 g at 03/31/23 1812   sertraline (ZOLOFT) tablet 150 mg  150 mg Oral Daily Massengill, Harrold Donath, MD   150 mg at 04/01/23 0949   SUMAtriptan (IMITREX) tablet 25 mg  25 mg Oral BID PRN [name], NP   25 mg at 03/20/23 1603   traZODone (DESYREL) tablet 50 mg  50 mg Oral QHS [name], NP   50 mg at 03/31/23 2108   Vitamin D (Ergocalciferol) (DRISDOL) 1.25 MG (50000 UNIT) capsule 50,000 Units  50,000 Units Oral Q7 days [name], NP   50,000 Units at 03/30/23 1517   Lab Results:  No results found for this or any previous visit (from the past 48 hour(s)).  Blood Alcohol level:  Lab Results  Component Value Date   ETH <10 12/19/2022   ETH <10 11/21/2019   Metabolic Disorder Labs: Lab Results  Component Value Date   HGBA1C 4.4 (L) 12/21/2022   MPG 79.58 12/21/2022   No results found for: "PROLACTIN" Lab Results  Component Value Date   CHOL 218 (H) 12/21/2022   TRIG 81 12/21/2022   HDL 53 12/21/2022   CHOLHDL 4.1 12/21/2022   VLDL 16 12/21/2022   LDLCALC 149 (H) 12/21/2022   LDLCALC 110 (H) 03/13/2011   Physical Findings: AIMS: Facial and Oral Movements Muscles of Facial Expression: None, normal Lips and Perioral Area: None, normal Jaw: None, normal Tongue: None, normal,Extremity Movements Upper (arms, wrists, hands, fingers): None, normal Lower (legs, knees, ankles, toes): None, normal, Trunk Movements Neck, shoulders, hips: None, normal, Overall Severity Severity of abnormal movements (highest score from questions above): None, normal Incapacitation due to abnormal movements: None, normal Patient's awareness of abnormal movements (rate only patient's report): No Awareness, Dental  Status Current problems with teeth and/or dentures?:  No Does patient usually wear dentures?: No  CIWA:    COWS:    AIMS:0 Musculoskeletal: Strength & Muscle Tone: within normal limits Gait & Station: normal Patient leans: N/A  Psychiatric Specialty Exam:  Presentation  General Appearance:  Appropriate for Environment  Eye Contact: Limited to baseline Speech: Decreased amount, decreased tone and volume but at baseline Speech Volume: Normal  Handedness: Right  Mood and Affect  Mood: Sad and depressed mainly secondary to being in the hospital with no place to go Affect: Congruent  Thought Process  Thought Processes: Coherent  Descriptions of Associations:Intact  Orientation:Partial  Thought Content:Logical Concrete History of Schizophrenia/Schizoaffective disorder:Yes  Duration of Psychotic Symptoms:Greater than six months  Hallucinations:Hallucinations: None Description of Auditory Hallucinations: NA   Ideas of Reference: None  Suicidal Thoughts:Suicidal Thoughts: No SI Active Intent and/or Plan: Without Intent; Without Plan; Without Means to Carry Out; Without Access to Means SI Passive Intent and/or Plan: Without Intent; Without Plan; Without Means to Carry Out; Without Access to Means   Homicidal Thoughts:Homicidal Thoughts: No  Sensorium  Memory: Immediate Good  Judgment: Fair  Insight: Fair  Art therapist  Concentration: Fair  Attention Span: Fair  Recall: Fair  Fund of Knowledge: Poor  Language: Fair  Psychomotor Activity  Psychomotor Activity: Psychomotor Activity: Normal    Assets  Assets: Resilience  Sleep  Sleep: Sleep: Fair Number of Hours of Sleep: 8  Physical Exam: Physical Exam Vitals and nursing note reviewed.  HENT:     Nose: Nose normal.     Mouth/Throat:     Pharynx: Oropharynx is clear.  Eyes:     Pupils: Pupils are equal, round, and reactive to light.  Cardiovascular:     Rate and  Rhythm: Normal rate.     Pulses: Normal pulses.  Pulmonary:     Effort: Pulmonary effort is normal.  Genitourinary:    Comments: Deferred Musculoskeletal:        General: Normal range of motion.     Cervical back: Normal range of motion.  Skin:    General: Skin is warm and dry.  Neurological:     General: No focal deficit present.     Mental Status: She is alert and oriented to person, place, and time.  Psychiatric:        Mood and Affect: Mood normal.        Behavior: Behavior normal.        Thought Content: Thought content normal.    Review of Systems  Constitutional:  Negative for chills, diaphoresis and fever.  HENT:  Negative for congestion, hearing loss and sore throat.   Eyes:  Negative for blurred vision.  Respiratory:  Negative for cough, shortness of breath and wheezing.   Cardiovascular:  Negative for chest pain and palpitations.  Gastrointestinal:  Negative for abdominal pain, constipation, diarrhea, heartburn, nausea and vomiting.  Genitourinary:  Negative for dysuria.  Musculoskeletal:  Negative for joint pain and myalgias.  Skin:  Negative for rash.  Neurological:  Negative for dizziness.  Psychiatric/Behavioral:  Positive for depression. Negative for hallucinations, memory loss, substance abuse and suicidal ideas. The patient is nervous/anxious and has insomnia.   All other systems reviewed and are negative.  Blood pressure (!) 95/56, pulse 73, temperature 97.9 F (36.6 C), temperature source Oral, resp. rate 20, height 4\' 11"  (1.499 m), weight 63 kg, SpO2 94%. Body mass index is 28.05 kg/m.  Treatment Plan Summary: Daily contact with patient to assess and evaluate symptoms and progress in treatment  and Medication management.    Still waiting on safe disposition as patient would not be able to live independently given cognitive impairment. Recommend assisted living.    No medication side effects reported.  Because of urinary incontinence, recommended adult  diapers to be worn.   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  Plan:  -Continue Imitrex 25 mg PRN BID for migraines -Continue Colace 100 mg daily for constipation -Continue Vitamin D 50.000 units weekly for bone health. -Continue Sertraline150 mg po Q daily for depression/anxiety -Continue Buspar 15 mg po bid for anxiety.  -Continue paliperidone 6 mg po qd for mood stabilization -Continue Trazodone 50 mg nightly for insomnia  -Continue Nicoderm 21 mg topically Q 24 hrs for nicotine withdrawal management. -Continue vitamin B12 1000 mcg IM weekly for 4 weeks then monthly -Continue Melatonin 5 mg nightly for sleep -Continue Protonix EC 40 mg p.o. daily for GERD -Continue MiraLAX 17 g p.o. PRN for constipation -Continue hydroxyzine 25 mg p.o. 3 times daily as needed for anxiety -Continue Ensure nutritional shakes TID in between meals  -Previously discontinued Hydroxyzine 50 mg nightly-hypotension in the mornings   Safety and Monitoring: Voluntary admission to inpatient psychiatric unit for safety, stabilization and treatment Daily contact with patient to assess and evaluate symptoms and progress in treatment Patient's case to be discussed in multi-disciplinary team meeting Observation Level : q15 minute checks Vital signs: q12 hours Precautions: Safety   Discharge Planning: Social work and case management to assist with discharge planning and identification of hospital follow-up needs prior to discharge Estimated LOS: Unknown at this time. Discharge Concerns: Need to establish a safety plan; Medication compliance and effectiveness Discharge Goals: Return home with outpatient referrals for mental health follow-up including medication  management/psychotherapy No legal guardian, has payee  [name], NP,  04/01/2023, 3:58 PM Patient ID: Yolanda [name], female   DOB: [birth year], 47 y.o.   MRN: [id number]

## 2023-04-01 NOTE — BHH Group Notes (Signed)

## 2023-04-01 NOTE — Progress Notes (Signed)
   04/01/23 0950  Psych Admission Type (Psych Patients Only)  Admission Status Involuntary  Psychosocial Assessment  Patient Complaints Depression  Eye Contact Fair  Facial Expression Flat  Affect Flat  Speech Logical/coherent  Interaction Minimal  Motor Activity Slow  Appearance/Hygiene Unremarkable  Behavior Characteristics Appropriate to situation  Mood Depressed  Thought Process  Coherency WDL  Content WDL  Delusions None reported or observed  Perception WDL  Hallucination None reported or observed  Judgment Poor  Confusion None  Danger to Self  Current suicidal ideation? Denies  Self-Injurious Behavior No self-injurious ideation or behavior indicators observed or expressed   Agreement Not to Harm Self Yes  Description of Agreement verbal  Danger to Others  Danger to Others None reported or observed

## 2023-04-02 DIAGNOSIS — F251 Schizoaffective disorder, depressive type: Secondary | ICD-10-CM | POA: Diagnosis not present

## 2023-04-02 NOTE — Group Note (Signed)
Recreation Therapy Group Note   Group Topic:Animal Assisted Therapy   Group Date: 04/02/2023 Start Time: 0945 End Time: 1030 Facilitators: Adanna Zuckerman-McCall, LRT,CTRS Location: [address] Dayroom   Animal-Assisted Activity (AAA) Program Checklist/Progress Notes Patient Eligibility Criteria Checklist & Daily Group note for Rec Tx Intervention  AAA/T Program Assumption of Risk Form signed by Patient/ or Parent Legal Guardian Yes  Patient understands his/her participation is voluntary Yes   Affect/Mood: N/A   Participation Level: Did not attend    Clinical Observations/Individualized Feedback:    Plan: Continue to engage patient in RT group sessions 2-3x/week.   Yolanda [name], LRT,CTRS  04/02/2023 1:10 PM

## 2023-04-02 NOTE — Plan of Care (Signed)
Problem: Coping: Goal: Coping ability will improve Outcome: Progressing Goal: Will verbalize feelings Outcome: Progressing   Problem: Health Behavior/Discharge Planning: Goal: Ability to make decisions will improve Outcome: Progressing

## 2023-04-02 NOTE — Group Note (Signed)
LCSW Group Therapy Note  Group Date: 04/02/2023 Start Time: 1100 End Time: 1145   Type of Therapy and Topic:  Group Therapy - How To Cope with Nervousness about Discharge   Participation Level:  Did Not Attend   Description of Group This process group involved identification of patients' feelings about discharge. Some of them are scheduled to be discharged soon, while others are new admissions, but each of them was asked to share thoughts and feelings surrounding discharge from the hospital. One common theme was that they are excited at the prospect of going home, while another was that many of them are apprehensive about sharing why they were hospitalized. Patients were given the opportunity to discuss these feelings with their peers in preparation for discharge.  Therapeutic Goals  Patient will identify their overall feelings about pending discharge. Patient will think about how they might proactively address issues that they believe will once again arise once they get home (i.e. with parents). Patients will participate in discussion about having hope for change.    Therapeutic Modalities Cognitive Behavioral Therapy   Ronnell Freshwater Javoni Lucken, LCSW 04/02/2023  1:25 PM

## 2023-04-02 NOTE — Progress Notes (Signed)
D) Pt received calm, visible, participating in milieu, and in no acute distress. Pt A & O x4. Pt denies SI, HI, A/ V H, and pain at this time. Pt endorses depression and anxiety this evening. A) Pt encouraged to drink fluids. Pt encouraged to come to staff with needs. Pt encouraged to attend and participate in groups. Pt encouraged to set reachable goals.  R) Pt remained safe on unit, in no acute distress, will continue to assess.     04/02/23 2000  Psych Admission Type (Psych Patients Only)  Admission Status Involuntary  Psychosocial Assessment  Patient Complaints Depression;Anxiety  Eye Contact Fair  Facial Expression Sad  Affect Depressed  Speech Soft  Interaction Minimal  Motor Activity Slow  Appearance/Hygiene Disheveled  Behavior Characteristics Appropriate to situation  Mood Depressed  Thought Process  Coherency WDL  Content WDL  Delusions None reported or observed  Perception WDL  Hallucination None reported or observed  Judgment Poor  Confusion None  Danger to Self  Current suicidal ideation? Denies  Self-Injurious Behavior No self-injurious ideation or behavior indicators observed or expressed   Agreement Not to Harm Self Yes  Description of Agreement verbal  Danger to Others  Danger to Others None reported or observed

## 2023-04-02 NOTE — B[facility] Group Notes (Signed)
Adult Psychoeducational Group Note  Date:  04/02/2023 Time:  9:29 PM  Group Topic/Focus:  Wrap-Up Group:   The focus of this group is to help patients review their daily goal of treatment and discuss progress on daily workbooks.  Participation Level:  Active  Participation Quality:  Appropriate  Affect:  Appropriate  Cognitive:  Appropriate  Insight: Appropriate  Engagement in Group:  Engaged  Modes of Intervention:  Discussion  Additional Comments:  Pt stated day was 8, stated today's goal was to get out the room, and it was achieved.    Joselyn Arrow 04/02/2023, 9:29 PM

## 2023-04-02 NOTE — Progress Notes (Signed)
   04/02/23 0929  Psych Admission Type (Psych Patients Only)  Admission Status Involuntary  Psychosocial Assessment  Patient Complaints Anxiety;Depression  Eye Contact Fair  Facial Expression Sad  Affect Depressed  Speech Soft  Interaction Minimal  Motor Activity Slow  Appearance/Hygiene Disheveled  Behavior Characteristics Appropriate to situation  Mood Depressed;Anxious  Thought Process  Coherency WDL  Content WDL  Delusions None reported or observed  Perception WDL  Hallucination None reported or observed  Judgment Poor  Confusion None  Danger to Self  Current suicidal ideation? Denies  Self-Injurious Behavior No self-injurious ideation or behavior indicators observed or expressed   Agreement Not to Harm Self Yes  Description of Agreement Verbal  Danger to Others  Danger to Others None reported or observed

## 2023-04-02 NOTE — Progress Notes (Signed)
[facility] MD Progress Note  04/02/2023 2:59 PM Yolanda [name]  MRN:  [id number]  Principal Problem: Schizoaffective disorder, depressive type (HCC)  Diagnosis: Principal Problem:   Schizoaffective disorder, depressive type (HCC) Active Problems:   GERD (gastroesophageal reflux disease)   Intellectual disability   Tobacco use disorder  Reason for admission: This is the first psychiatric admission in this [facility] in 12 years for this 48 AA female with an extensive hx of mental illnesses & probable polysubstance use disorders. She is admitted to the [facility] from the [facility] hospital with complain of worsening suicidal ideations with plan to stab herself. Per chart review, patient apparently reported at the ED that she has been depressed for a while & has not been taking her mental health medications. After medical evaluation.clearance, she was transferred to the [facility] for further psychiatric evaluation/treatments.   24 hr chart review: Sleep Hours last night: She reports satisfactory sleep the previous night. Attending nurse reports that patient slept well last night. No sleep hours were documented in the nursing flow sheets.  Nursing Concerns: None. Behavioral episodes in the past 24 hrs: She continues to remain Isolative to the room. Medication Compliance: Compliant  Vital Signs in the past 24 hrs: Within defined limits per nursing records.  PRN Medications in the past 24 hrs as pr MAR: Tylenol 650 mg on 09/23 at 1710, Hydroxyzine 25 mg tablet 09/23 at 2028, Nicorette gum 2 mg on 09/23 at 0952 and 1710.    Patient assessment note: [name] was found lying in bed upon approach and responded to tactile stimulation. During today's assessment, she presented with a flat affect and a depressed mood.  She continues to report feelings of depression and anxiety, which she rates as a 4 out of 10, with 10 being the most severe. She describes her appetite as fair, though she mentioned not eating breakfast due to  limited food variety. Her concentration appeared impaired, as she needed frequent prompting to stay awake and respond to assessment questions. Ms. Yolanda [name] denies any suicidal or homicidal thoughts, as well as auditory or visual hallucinations, paranoia, or delusional thinking. She was encouraged to participate in group therapy and recreational activities within the unit.  Ms. Yolanda [name] reports no side effects from her current psychiatric medications.  We agreed to maintain her current medication regimen with no changes at this time.   Discussed the following psychosocial stressors: Continuous hospitalization.  Education provided on the fact that we understand that hospitalization has been prolonged, but will continue to be in contact with DSS and are coordinating with her CSW for a safe placement in the community.  Patient verbalizes understanding. She is encouraged to come out of the room, and engage in activities in the day room. She is educated that it might become necessary to leave her room so that she can engage in activities, should she not start engaging in activities herself.  Continued hospitalization remains necessary at this time, to ensure patient's safety, as there continues to be no safe placement in the community at this time as per DSS.  Total Time spent with patient: 45 minutes  Past Psychiatric History:  See H & P  Past Medical History:  Past Medical History:  Diagnosis Date   Bipolar affect, depressed (HCC)    Constipation 08/17/2022   Depression    Falls 07/21/2022   Fracture of femoral neck, right, closed (HCC) 01/17/2022   Herpes simplex 08/22/2017   Open fracture dislocation of right elbow joint 01/17/2022  Past Surgical History:  Procedure Laterality Date   NO PAST SURGERIES     SALPINGECTOMY     Family History: History reviewed. No pertinent family history.  Family Psychiatric  History: See H & P  Social History:  Social History   Substance and Sexual  Activity  Alcohol Use Yes     Social History   Substance and Sexual Activity  Drug Use Yes   Types: Cocaine, Marijuana    Social History   Socioeconomic History   Marital status: Single    Spouse name: Not on file   Number of children: Not on file   Years of education: Not on file   Highest education level: Not on file  Occupational History   Not on file  Tobacco Use   Smoking status: Every Day   Smokeless tobacco: Not on file  Substance and Sexual Activity   Alcohol use: Yes   Drug use: Yes    Types: Cocaine, Marijuana   Sexual activity: Yes  Other Topics Concern   Not on file  Social History Narrative   Not on file   Social Determinants of Health   Financial Resource Strain: Low Risk  (09/12/2022)   Received from [facility], [facility]   Overall Financial Resource Strain (CARDIA)    Difficulty of Paying Living Expenses: Not hard at all  Food Insecurity: Patient Declined (12/20/2022)   Hunger Vital Sign    Worried About Running Out of Food in the Last Year: Patient declined    Ran Out of Food in the Last Year: Patient declined  Transportation Needs: No Transportation Needs (12/20/2022)   PRAPARE - Administrator, Civil Service (Medical): No    Lack of Transportation (Non-Medical): No  Physical Activity: Not on file  Stress: No Stress Concern Present (07/17/2022)   Received from [facility] - Occupational Stress Questionnaire    Feeling of Stress : Not at all  Social Connections: Unknown (07/16/2022)   Received from [facility], [facility]   Social Network    Social Network: Not on file   Sleep: Good  Appetite:  Good  Current Medications: Current Facility-Administered Medications  Medication Dose Route Frequency Provider Last Rate Last Admin   acetaminophen (TYLENOL) tablet 650 mg  650 mg Oral Q6H PRN [name], NP   650 mg at 04/01/23 1710   alum & mag hydroxide-simeth  (MAALOX/MYLANTA) 200-200-20 MG/5ML suspension 30 mL  30 mL Oral Q4H PRN [name], NP   30 mL at 01/28/23 1530   busPIRone (BUSPAR) tablet 15 mg  15 mg Oral BID [name], NP   15 mg at 04/02/23 1610   cyanocobalamin (VITAMIN B12) injection 1,000 mcg  1,000 mcg Intramuscular Q30 days Sarita Bottom, MD   1,000 mcg at 03/04/23 1421   diphenhydrAMINE (BENADRYL) capsule 50 mg  50 mg Oral TID PRN [name], NP   50 mg at 01/15/23 1211   Or   diphenhydrAMINE (BENADRYL) injection 50 mg  50 mg Intramuscular TID PRN [name], NP       feeding supplement (ENSURE ENLIVE / ENSURE PLUS) liquid 237 mL  237 mL Oral TID BM Nkwenti, Doris, NP   237 mL at 04/02/23 1402   haloperidol (HALDOL) tablet 5 mg  5 mg Oral TID PRN [name], NP   5 mg at 01/15/23 1211   Or   haloperidol lactate (HALDOL) injection 5 mg  5  mg Intramuscular TID PRN [name], NP       hydrOXYzine (ATARAX) tablet 25 mg  25 mg Oral TID PRN [name], DO   25 mg at 04/01/23 2028   LORazepam (ATIVAN) tablet 2 mg  2 mg Oral TID PRN [name], NP   2 mg at 01/15/23 1211   Or   LORazepam (ATIVAN) injection 2 mg  2 mg Intramuscular TID PRN [name], NP       magnesium hydroxide (MILK OF MAGNESIA) suspension 30 mL  30 mL Oral Daily PRN [name], NP   30 mL at 03/28/23 2134   melatonin tablet 5 mg  5 mg Oral QHS Nkwenti, Doris, NP   5 mg at 04/01/23 2028   nicotine (NICODERM CQ - dosed in mg/24 hours) patch 21 mg  21 mg Transdermal Daily [name], DO   21 mg at 03/25/23 1324   nicotine polacrilex (NICORETTE) gum 2 mg  2 mg Oral PRN [name], MD   2 mg at 04/01/23 1710   paliperidone (INVEGA) 24 hr tablet 6 mg  6 mg Oral Daily [name], NP   6 mg at 04/01/23 2028   pantoprazole (PROTONIX) EC tablet 40 mg  40 mg Oral Daily [name], NP   40 mg at 04/02/23 4010   polyethylene glycol (MIRALAX / GLYCOLAX) packet 17 g  17 g Oral Daily PRN [name], NP   17 g at 03/31/23 1812    sertraline (ZOLOFT) tablet 150 mg  150 mg Oral Daily Massengill, Harrold Donath, MD   150 mg at 04/02/23 2725   SUMAtriptan (IMITREX) tablet 25 mg  25 mg Oral BID PRN [name], NP   25 mg at 03/20/23 1603   traZODone (DESYREL) tablet 50 mg  50 mg Oral QHS [name], NP   50 mg at 04/01/23 2028   Vitamin D (Ergocalciferol) (DRISDOL) 1.25 MG (50000 UNIT) capsule 50,000 Units  50,000 Units Oral Q7 days [name], NP   50,000 Units at 03/30/23 1517   Lab Results:  No results found for this or any previous visit (from the past 48 hour(s)).  Blood Alcohol level:  Lab Results  Component Value Date   ETH <10 12/19/2022   ETH <10 11/21/2019   Metabolic Disorder Labs: Lab Results  Component Value Date   HGBA1C 4.4 (L) 12/21/2022   MPG 79.58 12/21/2022   No results found for: "PROLACTIN" Lab Results  Component Value Date   CHOL 218 (H) 12/21/2022   TRIG 81 12/21/2022   HDL 53 12/21/2022   CHOLHDL 4.1 12/21/2022   VLDL 16 12/21/2022   LDLCALC 149 (H) 12/21/2022   LDLCALC 110 (H) 03/13/2011   Physical Findings: AIMS: Facial and Oral Movements Muscles of Facial Expression: None, normal Lips and Perioral Area: None, normal Jaw: None, normal Tongue: None, normal,Extremity Movements Upper (arms, wrists, hands, fingers): None, normal Lower (legs, knees, ankles, toes): None, normal, Trunk Movements Neck, shoulders, hips: None, normal, Overall Severity Severity of abnormal movements (highest score from questions above): None, normal Incapacitation due to abnormal movements: None, normal Patient's awareness of abnormal movements (rate only patient's report): No Awareness, Dental Status Current problems with teeth and/or dentures?: No Does patient usually wear dentures?: No  CIWA:    COWS:    AIMS:0 Musculoskeletal: Strength & Muscle Tone: within normal limits Gait & Station: normal Patient leans: N/A  Psychiatric Specialty Exam:  Presentation  General Appearance:   Appropriate for Environment  Eye Contact:  Limited to baseline Speech: Decreased amount, decreased tone and volume but at baseline Speech Volume: Normal  Handedness: Right  Mood and Affect  Mood: Sad and depressed mainly secondary to being in the hospital with no place to go Affect: Congruent  Thought Process  Thought Processes: Coherent  Descriptions of Associations:Intact  Orientation:Partial  Thought Content:Logical [place] History of Schizophrenia/Schizoaffective disorder:Yes  Duration of Psychotic Symptoms:Greater than six months  Hallucinations:Hallucinations: None   Ideas of Reference: None  Suicidal Thoughts:Suicidal Thoughts: No   Homicidal Thoughts:Homicidal Thoughts: No  Sensorium  Memory: Immediate Good  Judgment: Fair  Insight: Fair  Art therapist  Concentration: Fair  Attention Span: Fair  Recall: Fair  Fund of Knowledge: Poor  Language: Fair  Psychomotor Activity  Psychomotor Activity: Psychomotor Activity: Normal    Assets  Assets: Resilience  Sleep  Sleep: Sleep: Fair  Physical Exam: Physical Exam Vitals and nursing note reviewed.  Constitutional:      General: She is not in acute distress. HENT:     Nose: Nose normal.     Mouth/Throat:     Pharynx: Oropharynx is clear.  Eyes:     Pupils: Pupils are equal, round, and reactive to light.  Cardiovascular:     Rate and Rhythm: Normal rate.     Pulses: Normal pulses.  Pulmonary:     Effort: Pulmonary effort is normal. No respiratory distress.  Genitourinary:    Comments: Deferred Musculoskeletal:        General: Normal range of motion.     Cervical back: Normal range of motion.  Skin:    General: Skin is warm and dry.  Neurological:     General: No focal deficit present.     Mental Status: She is oriented to person, place, and time.  Psychiatric:        Mood and Affect: Mood normal.        Behavior: Behavior normal.        Thought  Content: Thought content normal.    Review of Systems  Constitutional:  Negative for diaphoresis and fever.  HENT:  Negative for hearing loss.   Eyes:  Negative for redness.  Respiratory:  Negative for shortness of breath.   Cardiovascular:  Negative for chest pain.  Neurological:  Negative for headaches.  Psychiatric/Behavioral:  Positive for depression. The patient is nervous/anxious.   All other systems reviewed and are negative.  Blood pressure 93/76, pulse 88, temperature 97.9 F (36.6 C), temperature source Oral, resp. rate 14, height 4\' 11"  (1.499 m), weight 63 kg, SpO2 100%. Body mass index is 28.05 kg/m.  Treatment Plan Summary: Daily contact with patient to assess and evaluate symptoms and progress in treatment and Medication management.    Still waiting on safe disposition as patient would not be able to live independently given cognitive impairment. Recommend assisted living.    No medication side effects reported.  Because of urinary incontinence, recommended adult diapers to be worn.   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  Plan:  -Continue Imitrex 25 mg PRN BID for migraines -Continue Colace 100 mg daily for constipation -Continue Vitamin D 50.000 units weekly for bone health. -Continue Sertraline150 mg po Q daily for depression/anxiety -Continue Buspar 15 mg po bid for anxiety.  -Continue paliperidone 6 mg po qd for mood stabilization -Continue Trazodone 50 mg nightly for insomnia  -Continue  Nicoderm 21 mg topically Q 24 hrs for nicotine withdrawal management. -Continue vitamin B12 1000 mcg IM weekly for 4 weeks then monthly -Continue Melatonin 5 mg nightly for sleep -Continue Protonix EC 40 mg p.o. daily for  GERD -Continue MiraLAX 17 g p.o. PRN for constipation -Continue hydroxyzine 25 mg p.o. 3 times daily as needed for anxiety -Continue Ensure nutritional shakes TID in between meals  -Previously discontinued Hydroxyzine 50 mg nightly-hypotension in the mornings   Safety and Monitoring: Voluntary admission to inpatient psychiatric unit for safety, stabilization and treatment Daily contact with patient to assess and evaluate symptoms and progress in treatment Patient's case to be discussed in multi-disciplinary team meeting Observation Level : q15 minute checks Vital signs: q12 hours Precautions: Safety   Discharge Planning: Social work and case management to assist with discharge planning and identification of hospital follow-up needs prior to discharge Estimated LOS: Unknown at this time. Discharge Concerns: Need to establish a safety plan; Medication compliance and effectiveness Discharge Goals: Return home with outpatient referrals for mental health follow-up including medication management/psychotherapy No legal guardian, has payee  Norma Fredrickson, NP,  04/02/2023, 3:00 PM Patient ID: Larey Brick, female   DOB: 1976-01-24, 47 y.o.   MRN: [id number] Patient ID: BELIZE KRANING, female   DOB: [birth year], 47 y.o.   MRN: [id number]

## 2023-04-03 DIAGNOSIS — F251 Schizoaffective disorder, depressive type: Secondary | ICD-10-CM | POA: Diagnosis not present

## 2023-04-03 NOTE — BHH Group Notes (Signed)

## 2023-04-03 NOTE — Progress Notes (Signed)
   04/03/23 1000  Psych Admission Type (Psych Patients Only)  Admission Status Involuntary  Psychosocial Assessment  Patient Complaints Anxiety;Depression  Eye Contact Fair  Facial Expression Anxious;Sad  Affect Depressed;Anxious  Speech Soft  Interaction Minimal  Motor Activity Slow  Appearance/Hygiene Disheveled  Behavior Characteristics Appropriate to situation  Mood Depressed;Anxious  Thought Process  Coherency WDL  Content WDL  Delusions None reported or observed  Perception WDL  Hallucination None reported or observed  Judgment Impaired  Confusion None  Danger to Self  Current suicidal ideation? Denies  Self-Injurious Behavior No self-injurious ideation or behavior indicators observed or expressed   Agreement Not to Harm Self Yes  Description of Agreement Pt verbally contracts for safety  Danger to Others  Danger to Others None reported or observed

## 2023-04-03 NOTE — Progress Notes (Signed)
Pt did not attend NA group 

## 2023-04-03 NOTE — Progress Notes (Signed)
[facility] MD Progress Note  04/03/2023 4:29 PM Yolanda [name]  MRN:  [id number]  Principal Problem: Schizoaffective disorder, depressive type (HCC)  Diagnosis: Principal Problem:   Schizoaffective disorder, depressive type (HCC) Active Problems:   GERD (gastroesophageal reflux disease)   Intellectual disability   Tobacco use disorder  Reason for admission: This is the first psychiatric admission in this [facility] in 12 years for this 47 AA female with an extensive hx of mental illnesses & probable polysubstance use disorders. She is admitted to the [facility] from the [facility] hospital with complain of worsening suicidal ideations with plan to stab herself. Per chart review, patient apparently reported at the ED that she has been depressed for a while & has not been taking her mental health medications. After medical evaluation.clearance, she was transferred to the [facility] for further psychiatric evaluation/treatments.   24 hr chart review: Sleep Hours last night: Not documented, but patient reports a good sleep quality last night. Nursing Concerns: None. Behavioral episodes in the past 24 hrs: Remaining Isolative to the room. Medication Compliance: Compliant  Vital Signs in the past 24 hrs: Blood pressure slightly low earlier today morning at 89/65.  Pt has been preferring to stay lying in bed, and has been educated to get out of bed and to drink fluids more. PRN Medications in the past 24 hrs: none  Patient assessment note: On assessment today, the pt reports that their mood is still depressed, but states that her stressor is because she is still hospitalized.  Reports that anxiety is moderate Sleep is fair Appetite is fair, but continues to verbalize her dislike for hospital food Concentration is poor Energy level is poor Denies suicidal thoughts.  Denies use suicidal intent and plan.  Denies having any HI.  Denies having psychotic symptoms.   Denies having side effects to current psychiatric  medications.  We discussed keeping medications same as listed below, with no changes today.  Discussed the following psychosocial stressors: Continuous hospitalization.  Patient applauded for her continuous patience as we work with DSS.Encouraged to continue to come out to the day room and participate in activities. Encouraged to tend to personal hygiene needs.  Total Time spent with patient:  45 minutes  Past Psychiatric History:  See H & P  Past Medical History:  Past Medical History:  Diagnosis Date   Bipolar affect, depressed (HCC)    Constipation 08/17/2022   Depression    Falls 07/21/2022   Fracture of femoral neck, right, closed (HCC) 01/17/2022   Herpes simplex 08/22/2017   Open fracture dislocation of right elbow joint 01/17/2022    Past Surgical History:  Procedure Laterality Date   NO PAST SURGERIES     SALPINGECTOMY     Family History: History reviewed. No pertinent family history.  Family Psychiatric  History: See H & P  Social History:  Social History   Substance and Sexual Activity  Alcohol Use Yes     Social History   Substance and Sexual Activity  Drug Use Yes   Types: Cocaine, Marijuana    Social History   Socioeconomic History   Marital status: Single    Spouse name: Not on file   Number of children: Not on file   Years of education: Not on file   Highest education level: Not on file  Occupational History   Not on file  Tobacco Use   Smoking status: Every Day   Smokeless tobacco: Not on file  Substance and Sexual Activity  Alcohol use: Yes   Drug use: Yes    Types: Cocaine, Marijuana   Sexual activity: Yes  Other Topics Concern   Not on file  Social History Narrative   Not on file   Social Determinants of Health   Financial Resource Strain: Low Risk  (09/12/2022)   Received from [facility], [facility]   Overall Financial Resource Strain (CARDIA)    Difficulty of Paying Living Expenses: Not hard at all  Food Insecurity:  Patient Declined (12/20/2022)   Hunger Vital Sign    Worried About Running Out of Food in the Last Year: Patient declined    Ran Out of Food in the Last Year: Patient declined  Transportation Needs: No Transportation Needs (12/20/2022)   PRAPARE - Administrator, Civil Service (Medical): No    Lack of Transportation (Non-Medical): No  Physical Activity: Not on file  Stress: No Stress Concern Present (07/17/2022)   Received from [facility], [facility] Occupational [facility] - Occupational Stress Questionnaire    Feeling of Stress : Not at all  Social Connections: Unknown (07/16/2022)   Received from [facility], [facility]   Social Network    Social Network: Not on file   Sleep: Good  Appetite:  Good  Current Medications: Current Facility-Administered Medications  Medication Dose Route Frequency Provider Last Rate Last Admin   acetaminophen (TYLENOL) tablet 650 mg  650 mg Oral Q6H PRN [name], NP   650 mg at 04/01/23 1710   alum & mag hydroxide-simeth (MAALOX/MYLANTA) 200-200-20 MG/5ML suspension 30 mL  30 mL Oral Q4H PRN [name], NP   30 mL at 01/28/23 1530   busPIRone (BUSPAR) tablet 15 mg  15 mg Oral BID [name], NP   15 mg at 04/03/23 0748   cyanocobalamin (VITAMIN B12) injection 1,000 mcg  1,000 mcg Intramuscular Q30 days Sarita Bottom, MD   1,000 mcg at 04/03/23 1237   diphenhydrAMINE (BENADRYL) capsule 50 mg  50 mg Oral TID PRN [name], NP   50 mg at 01/15/23 1211   Or   diphenhydrAMINE (BENADRYL) injection 50 mg  50 mg Intramuscular TID PRN [name], NP       feeding supplement (ENSURE ENLIVE / ENSURE PLUS) liquid 237 mL  237 mL Oral TID BM Daylyn Azbill, NP   237 mL at 04/03/23 4098   haloperidol (HALDOL) tablet 5 mg  5 mg Oral TID PRN [name], NP   5 mg at 01/15/23 1211   Or   haloperidol lactate (HALDOL) injection 5 mg  5 mg Intramuscular TID PRN [name], NP       hydrOXYzine (ATARAX) tablet  25 mg  25 mg Oral TID PRN [name], DO   25 mg at 04/02/23 2041   LORazepam (ATIVAN) tablet 2 mg  2 mg Oral TID PRN [name], NP   2 mg at 01/15/23 1211   Or   LORazepam (ATIVAN) injection 2 mg  2 mg Intramuscular TID PRN [name], NP       magnesium hydroxide (MILK OF MAGNESIA) suspension 30 mL  30 mL Oral Daily PRN [name], NP   30 mL at 03/28/23 2134   melatonin tablet 5 mg  5 mg Oral QHS Yogi Arther, NP   5 mg at 04/02/23 2040   nicotine (NICODERM CQ - dosed in mg/24 hours) patch 21 mg  21 mg Transdermal Daily [name], DO   21  mg at 03/25/23 0951   nicotine polacrilex (NICORETTE) gum 2 mg  2 mg Oral PRN [name], MD   2 mg at 04/02/23 1816   paliperidone (INVEGA) 24 hr tablet 6 mg  6 mg Oral Daily [name], NP   6 mg at 04/02/23 2041   pantoprazole (PROTONIX) EC tablet 40 mg  40 mg Oral Daily [name], NP   40 mg at 04/03/23 0748   polyethylene glycol (MIRALAX / GLYCOLAX) packet 17 g  17 g Oral Daily PRN [name], NP   17 g at 03/31/23 1812   sertraline (ZOLOFT) tablet 150 mg  150 mg Oral Daily Massengill, Harrold Donath, MD   150 mg at 04/03/23 0748   SUMAtriptan (IMITREX) tablet 25 mg  25 mg Oral BID PRN [name], NP   25 mg at 03/20/23 1603   traZODone (DESYREL) tablet 50 mg  50 mg Oral QHS Nik Gorrell, Tyler Aas, NP   50 mg at 04/02/23 2041   Vitamin D (Ergocalciferol) (DRISDOL) 1.25 MG (50000 UNIT) capsule 50,000 Units  50,000 Units Oral Q7 days [name], NP   50,000 Units at 03/30/23 1517   Lab Results:  No results found for this or any previous visit (from the past 48 hour(s)).  Blood Alcohol level:  Lab Results  Component Value Date   ETH <10 12/19/2022   ETH <10 11/21/2019   Metabolic Disorder Labs: Lab Results  Component Value Date   HGBA1C 4.4 (L) 12/21/2022   MPG 79.58 12/21/2022   No results found for: "PROLACTIN" Lab Results  Component Value Date   CHOL 218 (H) 12/21/2022   TRIG 81 12/21/2022   HDL 53 12/21/2022    CHOLHDL 4.1 12/21/2022   VLDL 16 12/21/2022   LDLCALC 149 (H) 12/21/2022   LDLCALC 110 (H) 03/13/2011   Physical Findings: AIMS: Facial and Oral Movements Muscles of Facial Expression: None, normal Lips and Perioral Area: None, normal Jaw: None, normal Tongue: None, normal,Extremity Movements Upper (arms, wrists, hands, fingers): None, normal Lower (legs, knees, ankles, toes): None, normal, Trunk Movements Neck, shoulders, hips: None, normal, Overall Severity Severity of abnormal movements (highest score from questions above): None, normal Incapacitation due to abnormal movements: None, normal Patient's awareness of abnormal movements (rate only patient's report): No Awareness, Dental Status Current problems with teeth and/or dentures?: No Does patient usually wear dentures?: No  CIWA:    COWS:    AIMS:0 Musculoskeletal: Strength & Muscle Tone: within normal limits Gait & Station: normal Patient leans: N/A  Psychiatric Specialty Exam:  Presentation  General Appearance:  Appropriate for Environment; Fairly Groomed  Patent attorney: Limited to baseline Speech: Decreased amount, decreased tone and volume but at baseline Speech Volume: Normal  Handedness: Right  Mood and Affect  Mood: Sad and depressed mainly secondary to being in the hospital with no place to go Affect: Congruent  Thought Process  Thought Processes: Coherent  Descriptions of Associations:Intact  Orientation:Full (Time, Place and Person)  Thought Content:Logical [place]crete History of Schizophrenia/Schizoaffective disorder:Yes  Duration of Psychotic Symptoms:Greater than six months  Hallucinations:Hallucinations: None    Ideas of Reference: None  Suicidal Thoughts:Suicidal Thoughts: No    Homicidal Thoughts:Homicidal Thoughts: No   Sensorium  Memory: Immediate Good  Judgment: Fair  Insight: Fair  Executive Functions  Concentration: Poor  Attention  Span: Poor  Recall: Poor  Fund of Knowledge: Poor  Language: Fair  Psychomotor Activity  Psychomotor Activity: Psychomotor Activity: Normal     Assets  Assets: Resilience  Sleep  Sleep: Sleep: Fair   Physical Exam: Physical Exam Vitals and nursing note reviewed.  HENT:     Nose: Nose normal.     Mouth/Throat:     Pharynx: Oropharynx is clear.  Eyes:     Pupils: Pupils are equal, round, and reactive to light.  Cardiovascular:     Rate and Rhythm: Normal rate.     Pulses: Normal pulses.  Pulmonary:     Effort: Pulmonary effort is normal.  Genitourinary:    Comments: Deferred Musculoskeletal:        General: Normal range of motion.     Cervical back: Normal range of motion.  Skin:    General: Skin is warm and dry.  Neurological:     General: No focal deficit present.     Mental Status: She is alert and oriented to person, place, and time.  Psychiatric:        Mood and Affect: Mood normal.        Behavior: Behavior normal.        Thought Content: Thought content normal.    Review of Systems  Constitutional:  Negative for chills, diaphoresis and fever.  HENT:  Negative for congestion, hearing loss and sore throat.   Eyes:  Negative for blurred vision.  Respiratory:  Negative for cough, shortness of breath and wheezing.   Cardiovascular:  Negative for chest pain and palpitations.  Gastrointestinal:  Negative for abdominal pain, constipation, diarrhea, heartburn, nausea and vomiting.  Genitourinary:  Negative for dysuria.  Musculoskeletal:  Negative for joint pain and myalgias.  Skin:  Negative for rash.  Neurological:  Negative for dizziness.  Psychiatric/Behavioral:  Positive for depression. Negative for hallucinations, memory loss, substance abuse and suicidal ideas. The patient is nervous/anxious and has insomnia.   All other systems reviewed and are negative.  Blood pressure (!) 92/58, pulse 68, temperature 97.9 F (36.6 C), temperature source  Oral, resp. rate 14, height 4\' 11"  (1.499 m), weight 63 kg, SpO2 99%. Body mass index is 28.05 kg/m.  Treatment Plan Summary: Daily contact with patient to assess and evaluate symptoms and progress in treatment and Medication management.    Still waiting on safe disposition as patient would not be able to live independently given cognitive impairment. Recommend assisted living.    No medication side effects reported.  Because of urinary incontinence, recommended adult diapers to be worn.   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  Plan:  -Continue Imitrex 25 mg PRN BID for migraines -Continue Colace 100 mg daily for constipation -Continue Vitamin D 50.000 units weekly for bone health. -Continue Sertraline150 mg po Q daily for depression/anxiety -Continue Buspar 15 mg po bid for anxiety.  -Continue paliperidone 6 mg po qd for mood stabilization -Continue Trazodone 50 mg nightly for insomnia  -Continue Nicoderm 21 mg topically Q 24 hrs for nicotine withdrawal management. -Continue vitamin B12 1000 mcg IM weekly for 4 weeks then monthly -Continue Melatonin 5 mg nightly for sleep -Continue Protonix EC 40 mg p.o. daily for GERD -Continue MiraLAX 17 g p.o. PRN for constipation -Continue hydroxyzine 25 mg p.o. 3 times daily as needed for anxiety -Continue Ensure nutritional shakes TID in between meals  -Previously discontinued Hydroxyzine 50 mg nightly-hypotension in the mornings   Safety and Monitoring: Voluntary admission to inpatient psychiatric unit for safety, stabilization and treatment Daily contact with patient  to assess and evaluate symptoms and progress in treatment Patient's case to be discussed in multi-disciplinary team meeting Observation  Level : q15 minute checks Vital signs: q12 hours Precautions: Safety   Discharge Planning: Social work and case management to assist with discharge planning and identification of hospital follow-up needs prior to discharge Estimated LOS: Unknown at this time. Discharge Concerns: Need to establish a safety plan; Medication compliance and effectiveness Discharge Goals: Return home with outpatient referrals for mental health follow-up including medication management/psychotherapy No legal guardian, has payee  [name], NP,  04/03/2023, 4:29 PM Patient ID: Yolanda [name], female   DOB: December 07, 1975, 47 y.o.   MRN: [id number]

## 2023-04-03 NOTE — BHH Group Notes (Signed)
Psychoeducational Group Note  Date:  04/03/2023 Time:  8:30  Group Topic/Focus:  Goals Group:   The focus of this group is to help patients establish daily goals to achieve during treatment and discuss how the patient can incorporate goal setting into their daily lives to aide in recovery.  Participation Level: Did Not Attend  Participation Quality:  Not Applicable  Affect:  Not Applicable  Cognitive:  Not Applicable  Insight:  Not Applicable  Engagement in Group: Not Applicable  Additional Comments:  Pt  was in bed asleep during group group.  Danilo Cappiello, Sharen Counter 04/03/2023, 9:27 AM

## 2023-04-03 NOTE — Group Note (Signed)
Recreation Therapy Group Note   Group Topic:Communication  Group Date: 04/03/2023 Start Time: 0932 End Time: 1005 Facilitators: Spence Soberano-McCall, LRT,CTRS Location: [address] Dayroom   Group Topic: Communication, Problem Solving   Goal Area(s) Addresses:  Patient will effectively listen to complete activity.  Patient will identify communication skills used to make activity successful.  Patient will identify how skills used during activity can be used to reach post d/c goals.    Intervention: Building surveyor Activity - Geometric pattern cards, pencils, blank paper    Group Description: Geometric Drawings.  Three volunteers from the peer group will be shown an abstract picture with a particular arrangement of geometrical shapes.  Each round, one 'speaker' will describe the pattern, as accurately as possible without revealing the image to the group.  The remaining group members will listen and draw the picture to reflect how it is described to them. Patients with the role of 'listener' cannot ask clarifying questions but, may request that the speaker repeat a direction. Once the drawings are complete, the presenter will show the rest of the group the picture and compare how close each person came to drawing the picture. LRT will facilitate a post-activity discussion regarding effective communication and the importance of planning, listening, and asking for clarification in daily interactions with others.  Education: Environmental consultant, Active listening, Support systems, Discharge planning  Education Outcome: Acknowledges understanding/In group clarification offered/Needs additional education.    Affect/Mood: N/A   Participation Level: Did not attend    Clinical Observations/Individualized Feedback:     Plan: Continue to engage patient in RT group sessions 2-3x/week.   Yolanda [name], LRT,CTRS 04/03/2023 12:31 PM

## 2023-04-03 NOTE — Plan of Care (Signed)
Problem: Activity: Goal: Interest or engagement in leisure activities will improve Outcome: Progressing Goal: Imbalance in normal sleep/wake cycle will improve Outcome: Progressing

## 2023-04-04 DIAGNOSIS — F251 Schizoaffective disorder, depressive type: Secondary | ICD-10-CM | POA: Diagnosis not present

## 2023-04-04 NOTE — Progress Notes (Signed)
[facility] MD Progress Note  04/04/2023 10:24 AM Yolanda [name]  MRN:  [id number]  Principal Problem: Schizoaffective disorder, depressive type (HCC)  Diagnosis: Principal Problem:   Schizoaffective disorder, depressive type (HCC) Active Problems:   GERD (gastroesophageal reflux disease)   Intellectual disability   Tobacco use disorder  Reason for admission: This is the first psychiatric admission in this [facility] in 12 years for this 47 AA female with an extensive hx of mental illnesses & probable polysubstance use disorders. She is admitted to the [facility] from the [facility] hospital with complain of worsening suicidal ideations with plan to stab herself. Per chart review, patient apparently reported at the ED that she has been depressed for a while & has not been taking her mental health medications. After medical evaluation.clearance, she was transferred to the [facility] for further psychiatric evaluation/treatments.   24 hr chart review: Sleep Hours last night: Not documented, but patient reports a good sleep quality last night. Nursing Concerns: None reported. Behavioral episodes in the past 24 hrs: Remaining Isolative to the room. Medication Compliance: Compliant  Vital Signs in the past 24 hrs: Stable. PRN Medications in the past 24 hrs: none  Patient assessment note: On assessment today, the patient was noted to be laying in bed somewhat withdrawn with blinds closed and in a darkened room.  She endorses some depression but reports no significant worsening of symptoms.  She is still looking forward for discharge and was informed that she has a caseworker meeting on Friday when her discharge plans will be discussed. She understands that the plan is still in evaluation.  Her anxiety remains moderate sleep is fair to good.  Appetite is good.  At the end of the assessment patient thanked the Clinical research associate and later on was noted to be up and about walking in the hallways. No active SI/HI/AVH noted. The plan is  to continue the current medication regimen.  Discussed the following psychosocial stressors: Long-term hospitalization.  Ongoing discharge planning with the DSS.  Total Time spent with patient:  30 minutes  Past Psychiatric History:  See H & P  Past Medical History:  Past Medical History:  Diagnosis Date   Bipolar affect, depressed (HCC)    Constipation 08/17/2022   Depression    Falls 07/21/2022   Fracture of femoral neck, right, closed (HCC) 01/17/2022   Herpes simplex 08/22/2017   Open fracture dislocation of right elbow joint 01/17/2022    Past Surgical History:  Procedure Laterality Date   NO PAST SURGERIES     SALPINGECTOMY     Family History: History reviewed. No pertinent family history.  Family Psychiatric  History: See H & P  Social History:  Social History   Substance and Sexual Activity  Alcohol Use Yes     Social History   Substance and Sexual Activity  Drug Use Yes   Types: Cocaine, Marijuana    Social History   Socioeconomic History   Marital status: Single    Spouse name: Not on file   Number of children: Not on file   Years of education: Not on file   Highest education level: Not on file  Occupational History   Not on file  Tobacco Use   Smoking status: Every Day   Smokeless tobacco: Not on file  Substance and Sexual Activity   Alcohol use: Yes   Drug use: Yes    Types: Cocaine, Marijuana   Sexual activity: Yes  Other Topics Concern   Not on file  Social History Narrative   Not on file   Social Determinants of Health   Financial Resource Strain: Low Risk  (09/12/2022)   Received from [facility], [facility]   Overall Financial Resource Strain (CARDIA)    Difficulty of Paying Living Expenses: Not hard at all  Food Insecurity: Patient Declined (12/20/2022)   Hunger Vital Sign    Worried About Running Out of Food in the Last Year: Patient declined    Ran Out of Food in the Last Year: Patient declined  Transportation Needs: No  Transportation Needs (12/20/2022)   PRAPARE - Administrator, Civil Service (Medical): No    Lack of Transportation (Non-Medical): No  Physical Activity: Not on file  Stress: No Stress Concern Present (07/17/2022)   Received from [facility] - Occupational Stress Questionnaire    Feeling of Stress : Not at all  Social Connections: Unknown (07/16/2022)   Received from [facility]   Social Network    Social Network: Not on file   Sleep: Good  Appetite:  Good  Current Medications: Current Facility-Administered Medications  Medication Dose Route Frequency Provider Last Rate Last Admin   acetaminophen (TYLENOL) tablet 650 mg  650 mg Oral Q6H PRN [name], NP   650 mg at 04/01/23 1710   alum & mag hydroxide-simeth (MAALOX/MYLANTA) 200-200-20 MG/5ML suspension 30 mL  30 mL Oral Q4H PRN [name], NP   30 mL at 01/28/23 1530   busPIRone (BUSPAR) tablet 15 mg  15 mg Oral BID [name], NP   15 mg at 04/04/23 0900   cyanocobalamin (VITAMIN B12) injection 1,000 mcg  1,000 mcg Intramuscular Q30 days [name], Nadir, MD   1,000 mcg at 04/03/23 1237   diphenhydrAMINE (BENADRYL) capsule 50 mg  50 mg Oral TID PRN [name], NP   50 mg at 01/15/23 1211   Or   diphenhydrAMINE (BENADRYL) injection 50 mg  50 mg Intramuscular TID PRN [name], NP       feeding supplement (ENSURE ENLIVE / ENSURE PLUS) liquid 237 mL  237 mL Oral TID BM Nkwenti, Doris, NP   237 mL at 04/03/23 2440   haloperidol (HALDOL) tablet 5 mg  5 mg Oral TID PRN [name], NP   5 mg at 01/15/23 1211   Or   haloperidol lactate (HALDOL) injection 5 mg  5 mg Intramuscular TID PRN [name], NP       hydrOXYzine (ATARAX) tablet 25 mg  25 mg Oral TID PRN [name], DO   25 mg at 04/02/23 2041   LORazepam (ATIVAN) tablet 2 mg  2 mg Oral TID PRN [name], NP   2 mg at 01/15/23 1211   Or   LORazepam (ATIVAN) injection 2  mg  2 mg Intramuscular TID PRN [name], NP       magnesium hydroxide (MILK OF MAGNESIA) suspension 30 mL  30 mL Oral Daily PRN [name], NP   30 mL at 03/28/23 2134   melatonin tablet 5 mg  5 mg Oral QHS Nkwenti, Doris, NP   5 mg at 04/03/23 2151   nicotine (NICODERM CQ - dosed in mg/24 hours) patch 21 mg  21 mg Transdermal Daily [name], DO   21 mg at 03/25/23 0951   nicotine polacrilex (NICORETTE) gum 2 mg  2 mg Oral PRN [name], MD   2 mg at 04/02/23 1816  paliperidone (INVEGA) 24 hr tablet 6 mg  6 mg Oral Daily Nkwenti, Doris, NP   6 mg at 04/03/23 2151   pantoprazole (PROTONIX) EC tablet 40 mg  40 mg Oral Daily Nwoko, Nicole Kindred I, NP   40 mg at 04/04/23 0900   polyethylene glycol (MIRALAX / GLYCOLAX) packet 17 g  17 g Oral Daily PRN [name], NP   17 g at 04/03/23 1829   sertraline (ZOLOFT) tablet 150 mg  150 mg Oral Daily Massengill, Nathan, MD   150 mg at 04/04/23 0900   SUMAtriptan (IMITREX) tablet 25 mg  25 mg Oral BID PRN [name], NP   25 mg at 03/20/23 1603   traZODone (DESYREL) tablet 50 mg  50 mg Oral QHS Nkwenti, Doris, NP   50 mg at 04/03/23 2151   Vitamin D (Ergocalciferol) (DRISDOL) 1.25 MG (50000 UNIT) capsule 50,000 Units  50,000 Units Oral Q7 days [name], NP   50,000 Units at 03/30/23 1517   Lab Results:  No results found for this or any previous visit (from the past 48 hour(s)).  Blood Alcohol level:  Lab Results  Component Value Date   ETH <10 12/19/2022   ETH <10 11/21/2019   Metabolic Disorder Labs: Lab Results  Component Value Date   HGBA1C 4.4 (L) 12/21/2022   MPG 79.58 12/21/2022   No results found for: "PROLACTIN" Lab Results  Component Value Date   CHOL 218 (H) 12/21/2022   TRIG 81 12/21/2022   HDL 53 12/21/2022   CHOLHDL 4.1 12/21/2022   VLDL 16 12/21/2022   LDLCALC 149 (H) 12/21/2022   LDLCALC 110 (H) 03/13/2011   Physical Findings: AIMS: Facial and Oral Movements Muscles of Facial Expression: None,  normal Lips and Perioral Area: None, normal Jaw: None, normal Tongue: None, normal,Extremity Movements Upper (arms, wrists, hands, fingers): None, normal Lower (legs, knees, ankles, toes): None, normal, Trunk Movements Neck, shoulders, hips: None, normal, Overall Severity Severity of abnormal movements (highest score from questions above): None, normal Incapacitation due to abnormal movements: None, normal Patient's awareness of abnormal movements (rate only patient's report): No Awareness, Dental Status Current problems with teeth and/or dentures?: No Does patient usually wear dentures?: No  CIWA:    COWS:    AIMS:0 Musculoskeletal: Strength & Muscle Tone: within normal limits Gait & Station: normal Patient leans: N/A  Psychiatric Specialty Exam:  Presentation  General Appearance:  Appropriate for Environment; Casual; Disheveled  Eye Contact: Limited to baseline Speech: Decreased amount, decreased tone and volume but at baseline Speech Volume: Decreased  Handedness: Right  Mood and Affect  Mood: Sad and depressed mainly secondary to being in the hospital with no place to go Affect: Congruent  Thought Process  Thought Processes: Linear  Descriptions of Associations:Intact  Orientation:None  Thought Content:Perseveration Concrete History of Schizophrenia/Schizoaffective disorder:Yes  Duration of Psychotic Symptoms:Greater than six months  Hallucinations:Hallucinations: None    Ideas of Reference: None  Suicidal Thoughts:Suicidal Thoughts: No    Homicidal Thoughts:Homicidal Thoughts: No   Sensorium  Memory: Immediate Fair; Remote Fair; Recent Fair  Judgment: Fair  Insight: Fair  Art therapist  Concentration: Fair  Attention Span: Fair  Recall: [employer] of Knowledge: Fair  Language: Fair  Psychomotor Activity  Psychomotor Activity: Psychomotor Activity: Normal     Assets  Assets: Desire for Improvement;  Resilience  Sleep  Sleep: Sleep: Good   Physical Exam: Physical Exam Vitals and nursing note reviewed.  HENT:     Nose: Nose normal.  Mouth/Throat:     Pharynx: Oropharynx is clear.  Eyes:     Pupils: Pupils are equal, round, and reactive to light.  Cardiovascular:     Rate and Rhythm: Normal rate.     Pulses: Normal pulses.  Pulmonary:     Effort: Pulmonary effort is normal.  Genitourinary:    Comments: Deferred Musculoskeletal:        General: Normal range of motion.     Cervical back: Normal range of motion.  Skin:    General: Skin is warm and dry.  Neurological:     General: No focal deficit present.     Mental Status: She is alert and oriented to person, place, and time.  Psychiatric:        Mood and Affect: Mood normal.        Behavior: Behavior normal.        Thought Content: Thought content normal.    Review of Systems  Constitutional:  Negative for chills, diaphoresis and fever.  HENT:  Negative for congestion, hearing loss and sore throat.   Eyes:  Negative for blurred vision.  Respiratory:  Negative for cough, shortness of breath and wheezing.   Cardiovascular:  Negative for chest pain and palpitations.  Gastrointestinal:  Negative for abdominal pain, constipation, diarrhea, heartburn, nausea and vomiting.  Genitourinary:  Negative for dysuria.  Musculoskeletal:  Negative for joint pain and myalgias.  Skin:  Negative for rash.  Neurological:  Negative for dizziness.  Psychiatric/Behavioral:  Positive for depression. Negative for hallucinations, memory loss, substance abuse and suicidal ideas. The patient is nervous/anxious and has insomnia.   All other systems reviewed and are negative.  Blood pressure 99/69, pulse 99, temperature 98 F (36.7 C), temperature source Oral, resp. rate 18, height 4\' 11"  (1.499 m), weight 63 kg, SpO2 100%. Body mass index is 28.05 kg/m.  Treatment Plan Summary: Daily contact with patient to assess and evaluate symptoms  and progress in treatment and Medication management.    Still waiting on safe disposition as patient would not be able to live independently given cognitive impairment. Recommend assisted living.    No medication side effects reported.  Because of urinary incontinence, recommended adult diapers to be worn.   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  Plan:  -Continue Imitrex 25 mg PRN BID for migraines -Continue Colace 100 mg daily for constipation -Continue Vitamin D 50.000 units weekly for bone health. -Continue Sertraline150 mg po Q daily for depression/anxiety -Continue Buspar 15 mg po bid for anxiety.  -Continue paliperidone 6 mg po qd for mood stabilization -Continue Trazodone 50 mg nightly for insomnia  -Continue Nicoderm 21 mg topically Q 24 hrs for nicotine withdrawal management. -Continue vitamin B12 1000 mcg IM weekly for 4 weeks then monthly -Continue Melatonin 5 mg nightly for sleep -Continue Protonix EC 40 mg p.o. daily for GERD -Continue MiraLAX 17 g p.o. PRN for constipation -Continue hydroxyzine 25 mg p.o. 3 times daily as needed for anxiety -Continue Ensure nutritional shakes TID in between meals  -Previously discontinued Hydroxyzine 50 mg nightly-hypotension in the mornings   Safety and Monitoring: Voluntary admission to inpatient psychiatric unit for safety, stabilization and treatment Daily contact with patient to assess and evaluate symptoms and progress in treatment Patient's case to be discussed in multi-disciplinary team meeting Observation Level : q15 minute checks Vital signs: q12 hours  Precautions: Safety   Discharge Planning: Social work and case management to assist with discharge planning and identification of  hospital follow-up needs prior to discharge Estimated LOS: Unknown at this time. Discharge Concerns: Need to establish a safety plan; Medication compliance and effectiveness Discharge Goals: Return home with outpatient referrals for mental health follow-up including medication management/psychotherapy No legal guardian, has payee  [name], MD,  04/04/2023, 10:24 AM Patient ID: Yolanda [name], female   DOB: 1976/05/19, 47 y.o.   MRN: [id number]  Patient ID: Yolanda [name], female   DOB: 1975-12-11, 47 y.o.   MRN: [id number]

## 2023-04-04 NOTE — Plan of Care (Signed)
  Problem: Activity: Goal: Interest or engagement in leisure activities will improve Outcome: Progressing Goal: Imbalance in normal sleep/wake cycle will improve Outcome: Progressing  Yolanda [name] is compliant with treatment plan denies SI/HI/A/VH and verbally contracts from safety. Q 15 minutes safety checks ongoing. Patient remains safe.

## 2023-04-04 NOTE — Progress Notes (Signed)
CSW scheduled weekly meeting with DSS/Partners will convene on 04-05-2023 at 1130 am via Teams . SW Supervisor [name] will be in attendance a long with Dr, Sol Passer. CSW will continue to monitor.

## 2023-04-04 NOTE — Plan of Care (Signed)
Problem: Activity: Goal: Interest or engagement in leisure activities will improve Outcome: Progressing   Problem: Coping: Goal: Coping ability will improve Outcome: Progressing

## 2023-04-04 NOTE — Progress Notes (Signed)
   04/04/23 1300  Psych Admission Type (Psych Patients Only)  Admission Status Involuntary  Psychosocial Assessment  Patient Complaints Depression  Eye Contact Fair  Facial Expression Sad  Affect Depressed  Speech Soft  Interaction Minimal  Motor Activity Slow  Appearance/Hygiene Disheveled  Behavior Characteristics Cooperative;Appropriate to situation  Mood Depressed  Thought Process  Coherency WDL  Content WDL  Delusions None reported or observed  Perception WDL  Hallucination None reported or observed  Judgment Impaired  Confusion None  Danger to Self  Current suicidal ideation? Denies  Self-Injurious Behavior No self-injurious ideation or behavior indicators observed or expressed   Agreement Not to Harm Self Yes  Description of Agreement agreed to contact staff before acting on harmful thoughts

## 2023-04-04 NOTE — Progress Notes (Signed)
   04/04/23 1528  What Happened  Was fall witnessed? No  Who witnessed fall? unsure-  Patients activity before fall ambulating-unassisted;other (comment) (playing volleyball)  Point of contact head;arm/shoulder;hip/leg (playing volleyball in gym)  Was patient injured? No  Patient found on floor  Found by No one-pt stated they fell  Stated prior activity ambulating-unassisted (playing volleyball)  Provider Notification  Provider Name/Title Dr. Renaldo Fiddler  Date Provider Notified 04/04/23  Time Provider Notified 1530  Method of Notification Face-to-face  Notification Reason Fall  Provider response In department  Date of Provider Response 04/04/23  Time of Provider Response 1535  Follow Up  Family notified No - patient refusal  Time family notified  (na)  Additional tests No  Simple treatment Other (comment) (tylenol)  Progress note created (see row info) Yes  Blank note created Yes  Vitals  Temp 97.9 F (36.6 C)  Temp Source Oral  BP (!) 94/55  BP Location Left Arm  BP Method Automatic  Patient Position (if appropriate) Sitting  Pulse Rate 92  Pulse Rate Source Dinamap  Resp 16  Oxygen Therapy  SpO2 100 %  O2 Device Room Air  Pain Assessment  Pain Scale 0-10  Pain Score 8  Pain Type Acute pain  Pain Location Head  Pain Orientation Right;Medial  Pain Descriptors / Indicators Discomfort  Pain Frequency Intermittent  Patients Stated Pain Goal 0  Pain Intervention(s) Medication (See eMAR)  Neurological  Level of Consciousness Alert

## 2023-04-04 NOTE — Progress Notes (Signed)
Patient has been ambulating in hall, talking on the phone- No distress noted. Pt alert and oriented x 4- PEARL- No nausea and vomiting. VS WNL. Pt does continue to complain of headache 8/10. Imitrex given per pain as ordered. Provider made aware. Will continue to monitor

## 2023-04-04 NOTE — Group Note (Signed)
LCSW Group Therapy Note   Group Date: 04/04/2023 Start Time: 1100 End Time: 1200   Type of Therapy and Topic: Group Therapy: Relationship Check-Ins   Participation Level: Did Not Attend   Description Group:  In this group patients are encouraged to rate how well or not well they are able to improve their relationships in Beliefs and Values, Communication, Family and Friends, Actuary and Household, and Intimacy. Patients will be able to discuss and identify what is going well within these aspects and what is not. Patients will be able to find appropriate ways, solutions, and skills that will help them within the selected relationships category. Patients will be encouraged to share and reflect on why things within their relationships are not going so well and get feedback from the instructor or their peers.  This group will be solution focused and process-oriented with patients' participation in sharing and listening to their own and peers experience; along with receive support and advice on how to improve or change the circumstance that is known to be challenging in their relationships category (Beliefs and Values, Communication, Family and Friends, Actuary and Household, and Intimacy).   Therapeutic Goals:  Patient will identify their strengths and weakness within their Beliefs and Values, Communication, Family and Friends, Actuary and Household, and Intimacy relationships. Patient will identify reasons why and how they can improve their relationships.  Patient will identify how they can be supportive and honest to themselves and in their relationships.  Patient will be able to gain support and give support to others with similar challenges.   Summary of Patient Progress  Did not attend group  Therapeutic Modalities:  Solution Focused Therapy Cognitive Behavioral Therapy  Psychodynamic Therapy  Dialectical Behavior Therapy   Izell [place], LCSW 04/04/2023  12:44 PM

## 2023-04-04 NOTE — Progress Notes (Addendum)
Patient and peer returned from the gym (rec therapy) early accompanied by staff. Upon returning, pt reported that she had a headache. Pt stated that she was on the sideline while her peers were playing volleyball,and as she was dodging the ball, she fell and hit her head.  Per staff there were no witnesses, although there was one peer who stated that she saw her fall. (Security notified and is looking at video)  Pt is alert and oriented. VS obtained and were WNL. Provider notified and completed neuro assessment.  Neuro WNL         1700 PER SECURITY VIDEO: Pt observed sitting in chair on the side line of volleyball game- Pt fell from chair on right side of body when ball came toward her.

## 2023-04-04 NOTE — BHH Group Notes (Signed)
Adult Psychoeducational Group Note  Date:  04/04/2023 Time:  9:29 PM  Group Topic/Focus:  Wrap-Up Group:   The focus of this group is to help patients review their daily goal of treatment and discuss progress on daily workbooks.  Participation Level:  Active  Participation Quality:  Appropriate  Affect:  Appropriate  Cognitive:  Appropriate  Insight: Appropriate  Engagement in Group:  Engaged  Modes of Intervention:  Discussion and Support  Additional Comments:  Pt told that today was a generally good day on the unit, the highlight of which was talking to family on the phone. On the subject of ways to stay well upon discharge, Faye mentioned taking her medication as prescribed. She rated her day an 8 out of 10.  Christ Kick 04/04/2023, 9:29 PM

## 2023-04-04 NOTE — Plan of Care (Signed)
Notified by nursing staff that while downstairs in the gym patient had fallen and was reporting she hit her head.     Went to the unit to interview patient.  She reports that she was playing volleyball with some the other patients and went for the ball and fell.  She reports that she hit the right side of her head.  She reports it is sore now and that she received a Tylenol for it.  She reports no vision changes, weakness, or other acute changes other than head pain.  On exam cranial nerves II through XII are grossly intact.  Vitals are stable.  For now we will continue with the fall protocol per unit.  Instructed her to notify staff if she begins to have any vision changes, significant worsening of head pain, or any other changes and she reported understanding.  She went back to her room to lay down.    Arna Snipe MD Resident

## 2023-04-05 ENCOUNTER — Encounter (HOSPITAL_COMMUNITY): Payer: Self-pay

## 2023-04-05 DIAGNOSIS — F251 Schizoaffective disorder, depressive type: Secondary | ICD-10-CM | POA: Diagnosis not present

## 2023-04-05 NOTE — Plan of Care (Signed)
  Problem: Self-Concept: Goal: Level of anxiety will decrease Outcome: Progressing   Problem: Education: Goal: Utilization of techniques to improve thought processes will improve Outcome: Progressing Goal: Knowledge of the prescribed therapeutic regimen will improve Outcome: Progressing

## 2023-04-05 NOTE — Group Note (Unsigned)
Date:  04/05/2023 Time:  1:30 PM  Group Topic/Focus:  Wellness Toolbox:   The focus of this group is to discuss various aspects of wellness, balancing those aspects and exploring ways to increase the ability to experience wellness.  Patients will create a wellness toolbox for use upon discharge.     Participation Level:  {BHH PARTICIPATION ZOXWR:60454}  Participation Quality:  {BHH PARTICIPATION QUALITY:22265}  Affect:  {BHH AFFECT:22266}  Cognitive:  {BHH COGNITIVE:22267}  Insight: {BHH Insight2:20797}  Engagement in Group:  {BHH ENGAGEMENT IN UJWJX:91478}  Modes of Intervention:  {BHH MODES OF INTERVENTION:22269}  Additional Comments:  ***  [name] 04/05/2023, 1:30 PM

## 2023-04-05 NOTE — Group Note (Signed)
Date:  04/05/2023 Time:  9:46 PM  Group Topic/Focus:  Group Therapy; AA Meeting     Participation Level:  Did Not Attend  Participation Quality:   N/A  Affect:   N/A  Cognitive:   N/A  Insight: None  Engagement in Group:  None  Modes of Intervention:   N/A  Additional Comments:  Patient did not attend AA Meeting.   Yolanda [name] 04/05/2023, 9:46 PM

## 2023-04-05 NOTE — Group Note (Signed)
Recreation Therapy Group Note   Group Topic:Leisure Education  Group Date: 04/05/2023 Start Time: 0930 End Time: 1000 Facilitators: Charmin Aguiniga-McCall, LRT,CTRS Location: [address] Dayroom   Group Topic: Leisure Education   Goal Area(s) Addresses:  Patient will successfully demonstrate knowledge of leisure and recreation interests. Patient will successfully identify benefits of leisure participation.  Patient will verbalize appropriate recreation activities to use post discharge.  Intervention: Music Trivia   Group Description: LRT facilitated a competitive group game to challenge patients knowledge of music. In teams, patients were asked to answer trivia questions that required patients to finish the lyric or guess the artist. The team with the most points at the end of the game won.   Education:  Leisure Education, Publishing copy Outcome: Acknowledges education   Affect/Mood: N/A   Participation Level: Did not attend    Clinical Observations/Individualized Feedback:     Plan: Continue to engage patient in RT group sessions 2-3x/week.   Sama Arauz-McCall, LRT,CTRS 04/05/2023 12:29 PM

## 2023-04-05 NOTE — Group Note (Signed)
Date:  04/05/2023 Time:  1:28 PM  Group Topic/Focus:  Wellness Toolbox:   The focus of this group is to discuss various aspects of wellness, balancing those aspects and exploring ways to increase the ability to experience wellness.  Patients will create a wellness toolbox for use upon discharge.    Participation Level:  Did Not Attend  Participation Quality:      Affect:      Cognitive:      Insight: None  Engagement in Group:      Modes of Intervention:      Additional Comments:     [name] 04/05/2023, 1:28 PM

## 2023-04-05 NOTE — Progress Notes (Signed)
[facility] MD Progress Note  04/05/2023 1:21 PM Yolanda [name]  MRN:  [id number]  Principal Problem: Schizoaffective disorder, depressive type (HCC)  Diagnosis: Principal Problem:   Schizoaffective disorder, depressive type (HCC) Active Problems:   GERD (gastroesophageal reflux disease)   Intellectual disability   Tobacco use disorder  Reason for admission: This is the first psychiatric admission in this [facility] in 12 years for this 47 AA female with an extensive hx of mental illnesses & probable polysubstance use disorders. She is admitted to the [facility] from the [facility] hospital with complain of worsening suicidal ideations with plan to stab herself. Per chart review, patient apparently reported at the ED that she has been depressed for a while & has not been taking her mental health medications. After medical evaluation.clearance, she was transferred to the [facility] for further psychiatric evaluation/treatments.   24 hr chart review: Sleep Hours last night: No change continues to sleep fairly well. Nursing Concerns: None reported. She did however receive Imitrex 25 mg on 926 at 6:32 PM for headache.  No agitation or behavioral issues.  Patient apparently fell yesterday but on examination was noted not to have any significant symptoms and she was closely monitored. Behavioral episodes in the past 24 hrs: She continues to remain isolated to the room most of the time. Medication Compliance: Compliant  Vital Signs in the past 24 hrs: Stable. PRN Medications in the past 24 hrs: none  Patient assessment note: On assessment today, the patient was seen and evaluated today and she continues to be partially compliant with groups but she stays in her room generally withdrawn.  However this is not a significant change in general.  Overall she is looking forward to discharge.  When seen today she is alert and oriented cooperative and fairly pleasant and even smiled and responded appropriately.  She denies depression  and she denies active suicidal ideations or homicidal ideations.  No significant AVH was noted.  Patient continues on her current medications and has been no change.  No changes noted since her fall yesterday.   Discussed the following psychosocial stressors: Long-term hospitalization.  Had a meeting with DSS/partner's health care today along with Ernestine Mcmurray from social work.  They failed to require slight modification to help her expand her options from assisted living facility/skilled nursing all the way to [facility].  This modification was done and was sent back to the DSS.  Total Time spent with patient:  30 minutes  Past Psychiatric History:  See H & P  Past Medical History:  Past Medical History:  Diagnosis Date   Bipolar affect, depressed (HCC)    Constipation 08/17/2022   Depression    Falls 07/21/2022   Fracture of femoral neck, right, closed (HCC) 01/17/2022   Herpes simplex 08/22/2017   Open fracture dislocation of right elbow joint 01/17/2022    Past Surgical History:  Procedure Laterality Date   NO PAST SURGERIES     SALPINGECTOMY     Family History: History reviewed. No pertinent family history.  Family Psychiatric  History: See H & P  Social History:  Social History   Substance and Sexual Activity  Alcohol Use Yes     Social History   Substance and Sexual Activity  Drug Use Yes   Types: Cocaine, Marijuana    Social History   Socioeconomic History   Marital status: Single    Spouse name: Not on file   Number of children: Not on file   Years of education:  Not on file   Highest education level: Not on file  Occupational History   Not on file  Tobacco Use   Smoking status: Every Day   Smokeless tobacco: Not on file  Substance and Sexual Activity   Alcohol use: Yes   Drug use: Yes    Types: Cocaine, Marijuana   Sexual activity: Yes  Other Topics Concern   Not on file  Social History Narrative   Not on file   Social Determinants of Health    Financial Resource Strain: Low Risk  (09/12/2022)   Received from [facility], [facility]   Overall Financial Resource Strain (CARDIA)    Difficulty of Paying Living Expenses: Not hard at all  Food Insecurity: Patient Declined (12/20/2022)   Hunger Vital Sign    Worried About Running Out of Food in the Last Year: Patient declined    Ran Out of Food in the Last Year: Patient declined  Transportation Needs: No Transportation Needs (12/20/2022)   PRAPARE - Administrator, Civil Service (Medical): No    Lack of Transportation (Non-Medical): No  Physical Activity: Not on file  Stress: No Stress Concern Present (07/17/2022)   Received from [facility], [facility] - Occupational Stress Questionnaire    Feeling of Stress : Not at all  Social Connections: Unknown (07/16/2022)   Received from [facility], [facility]   Social Network    Social Network: Not on file   Sleep: Good  Appetite:  Good  Current Medications: Current Facility-Administered Medications  Medication Dose Route Frequency Provider Last Rate Last Admin   acetaminophen (TYLENOL) tablet 650 mg  650 mg Oral Q6H PRN [name], NP   650 mg at 04/04/23 2059   alum & mag hydroxide-simeth (MAALOX/MYLANTA) 200-200-20 MG/5ML suspension 30 mL  30 mL Oral Q4H PRN [name], NP   30 mL at 01/28/23 1530   busPIRone (BUSPAR) tablet 15 mg  15 mg Oral BID [name], NP   15 mg at 04/05/23 0756   cyanocobalamin (VITAMIN B12) injection 1,000 mcg  1,000 mcg Intramuscular Q30 days Sarita Bottom, MD   1,000 mcg at 04/03/23 1237   diphenhydrAMINE (BENADRYL) capsule 50 mg  50 mg Oral TID PRN [name], NP   50 mg at 01/15/23 1211   Or   diphenhydrAMINE (BENADRYL) injection 50 mg  50 mg Intramuscular TID PRN [name], NP       feeding supplement (ENSURE ENLIVE / ENSURE PLUS) liquid 237 mL  237 mL Oral TID BM Nkwenti, Doris, NP   237 mL at 04/05/23 0957    haloperidol (HALDOL) tablet 5 mg  5 mg Oral TID PRN [name], NP   5 mg at 01/15/23 1211   Or   haloperidol lactate (HALDOL) injection 5 mg  5 mg Intramuscular TID PRN [name], NP       hydrOXYzine (ATARAX) tablet 25 mg  25 mg Oral TID PRN [name], DO   25 mg at 04/02/23 2041   LORazepam (ATIVAN) tablet 2 mg  2 mg Oral TID PRN [name], NP   2 mg at 01/15/23 1211   Or   LORazepam (ATIVAN) injection 2 mg  2 mg Intramuscular TID PRN [name], NP       magnesium hydroxide (MILK OF MAGNESIA) suspension 30 mL  30 mL Oral Daily PRN [name], NP   30 mL at 03/28/23 2134   melatonin  tablet 5 mg  5 mg Oral QHS Nkwenti, Doris, NP   5 mg at 04/04/23 2057   nicotine (NICODERM CQ - dosed in mg/24 hours) patch 21 mg  21 mg Transdermal Daily [name], DO   21 mg at 03/25/23 1610   nicotine polacrilex (NICORETTE) gum 2 mg  2 mg Oral PRN [name], MD   2 mg at 04/02/23 1816   paliperidone (INVEGA) 24 hr tablet 6 mg  6 mg Oral Daily [name], NP   6 mg at 04/04/23 2057   pantoprazole (PROTONIX) EC tablet 40 mg  40 mg Oral Daily [name], NP   40 mg at 04/05/23 0756   polyethylene glycol (MIRALAX / GLYCOLAX) packet 17 g  17 g Oral Daily PRN [name], NP   17 g at 04/03/23 1829   sertraline (ZOLOFT) tablet 150 mg  150 mg Oral Daily Massengill, Harrold Donath, MD   150 mg at 04/05/23 0756   SUMAtriptan (IMITREX) tablet 25 mg  25 mg Oral BID PRN [name], NP   25 mg at 04/04/23 1832   traZODone (DESYREL) tablet 50 mg  50 mg Oral QHS [name], NP   50 mg at 04/04/23 2057   Vitamin D (Ergocalciferol) (DRISDOL) 1.25 MG (50000 UNIT) capsule 50,000 Units  50,000 Units Oral Q7 days [name], NP   50,000 Units at 03/30/23 1517   Lab Results:  No results found for this or any previous visit (from the past 48 hour(s)).  Blood Alcohol level:  Lab Results  Component Value Date   ETH <10 12/19/2022   ETH <10 11/21/2019   Metabolic Disorder  Labs: Lab Results  Component Value Date   HGBA1C 4.4 (L) 12/21/2022   MPG 79.58 12/21/2022   No results found for: "PROLACTIN" Lab Results  Component Value Date   CHOL 218 (H) 12/21/2022   TRIG 81 12/21/2022   HDL 53 12/21/2022   CHOLHDL 4.1 12/21/2022   VLDL 16 12/21/2022   LDLCALC 149 (H) 12/21/2022   LDLCALC 110 (H) 03/13/2011   Physical Findings: AIMS: Facial and Oral Movements Muscles of Facial Expression: None, normal Lips and Perioral Area: None, normal Jaw: None, normal Tongue: None, normal,Extremity Movements Upper (arms, wrists, hands, fingers): None, normal Lower (legs, knees, ankles, toes): None, normal, Trunk Movements Neck, shoulders, hips: None, normal, Overall Severity Severity of abnormal movements (highest score from questions above): None, normal Incapacitation due to abnormal movements: None, normal Patient's awareness of abnormal movements (rate only patient's report): No Awareness, Dental Status Current problems with teeth and/or dentures?: No Does patient usually wear dentures?: No  CIWA:    COWS:    AIMS:0 Musculoskeletal: Strength & Muscle Tone: within normal limits Gait & Station: normal Patient leans: N/A  Psychiatric Specialty Exam:  Presentation  General Appearance:  Appropriate for Environment; Casual; Disheveled  Eye Contact: Limited to baseline Speech: Decreased amount, decreased tone and volume but at baseline Speech Volume: Decreased  Handedness: Right  Mood and Affect  Mood: Sad and depressed mainly secondary to being in the hospital with no place to go Affect: Appropriate  Thought Process  Thought Processes: Linear  Descriptions of Associations:Intact  Orientation:Full (Time, Place and Person)  Thought Content:Logical Concrete History of Schizophrenia/Schizoaffective disorder:Yes  Duration of Psychotic Symptoms:Greater than six months  Hallucinations:Hallucinations: None    Ideas of Reference:  None  Suicidal Thoughts:Suicidal Thoughts: No    Homicidal Thoughts:Homicidal Thoughts: No   Sensorium  Memory: Immediate Fair; Recent Poor; Remote Poor  Judgment: Fair  Insight: Fair  Chartered certified accountant: Fair  Attention Span: Fair  Recall: Jennelle Human of Knowledge: Fair  Language: Fair  Psychomotor Activity  Psychomotor Activity: Psychomotor Activity: Normal     Assets  Assets: Resilience; Leisure Time  Sleep  Sleep: Sleep: Fair   Physical Exam: Physical Exam Vitals and nursing note reviewed.  HENT:     Nose: Nose normal.     Mouth/Throat:     Pharynx: Oropharynx is clear.  Eyes:     Pupils: Pupils are equal, round, and reactive to light.  Cardiovascular:     Rate and Rhythm: Normal rate.     Pulses: Normal pulses.  Pulmonary:     Effort: Pulmonary effort is normal.  Genitourinary:    Comments: Deferred Musculoskeletal:        General: Normal range of motion.     Cervical back: Normal range of motion.  Skin:    General: Skin is warm and dry.  Neurological:     General: No focal deficit present.     Mental Status: She is alert and oriented to person, place, and time.  Psychiatric:        Mood and Affect: Mood normal.        Behavior: Behavior normal.        Thought Content: Thought content normal.    Review of Systems  Constitutional:  Negative for chills, diaphoresis and fever.  HENT:  Negative for congestion, hearing loss and sore throat.   Eyes:  Negative for blurred vision.  Respiratory:  Negative for cough, shortness of breath and wheezing.   Cardiovascular:  Negative for chest pain and palpitations.  Gastrointestinal:  Negative for abdominal pain, constipation, diarrhea, heartburn, nausea and vomiting.  Genitourinary:  Negative for dysuria.  Musculoskeletal:  Negative for joint pain and myalgias.  Skin:  Negative for rash.  Neurological:  Negative for dizziness.  Psychiatric/Behavioral:  Positive for  depression. Negative for hallucinations, memory loss, substance abuse and suicidal ideas. The patient is nervous/anxious and has insomnia.   All other systems reviewed and are negative.  Blood pressure 97/70, pulse 61, temperature (!) 97.5 F (36.4 C), temperature source Oral, resp. rate 18, height 4\' 11"  (1.499 m), weight 63 kg, SpO2 96%. Body mass index is 28.05 kg/m.  Treatment Plan Summary: Daily contact with patient to assess and evaluate symptoms and progress in treatment and Medication management.    Still waiting on safe disposition as patient would not be able to live independently given cognitive impairment. Recommend assisted living.    No medication side effects reported.  Because of urinary incontinence, recommended adult diapers to be worn.   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  Plan:  -Continue Imitrex 25 mg PRN BID for migraines -Continue Colace 100 mg daily for constipation -Continue Vitamin D 50.000 units weekly for bone health. -Continue Sertraline150 mg po Q daily for depression/anxiety -Continue Buspar 15 mg po bid for anxiety.  -Continue paliperidone 6 mg po qd for mood stabilization -Continue Trazodone 50 mg nightly for insomnia  -Continue Nicoderm 21 mg topically Q 24 hrs for nicotine withdrawal management. -Continue vitamin B12 1000 mcg IM weekly for 4 weeks then monthly -Continue Melatonin 5 mg nightly for sleep -Continue Protonix EC 40 mg p.o. daily for GERD -Continue MiraLAX 17 g p.o. PRN for constipation -Continue hydroxyzine  25 mg p.o. 3 times daily as needed for anxiety -Continue Ensure nutritional shakes TID in between meals  -Previously discontinued Hydroxyzine 50 mg nightly-hypotension in the mornings    Safety and Monitoring: Voluntary admission to inpatient psychiatric unit for safety, stabilization and treatment Daily contact with patient to assess and evaluate symptoms and progress in treatment Patient's case to be discussed in multi-disciplinary team meeting Observation Level : q15 minute checks Vital signs: q12 hours Precautions: Safety   Discharge Planning: Social work and case management to assist with discharge planning and identification of hospital follow-up needs prior to discharge Estimated LOS: Unknown at this time. Discharge Concerns: Need to establish a safety plan; Medication compliance and effectiveness Discharge Goals: Return home with outpatient referrals for mental health follow-up including medication management/psychotherapy No legal guardian, has payee  [name], MD,  04/05/2023, 1:21 PM Patient ID: Larey Brick, female   DOB: 09-28-1975, 47 y.o.   MRN: [id number]  Patient ID: ACELYNN DEJONGE, female   DOB: 1976-03-07, 47 y.o.   MRN: [id number] Patient ID: OLUKEMI PANCHAL, female   DOB: May 07, 1976, 47 y.o.   MRN: [id number]

## 2023-04-05 NOTE — Progress Notes (Signed)
Patient is compliant with treatment plan denies SI/HI/A/VH and verbally contracts for safety, endorses depression related to discharge this shift. Pt has been sad due to the fall she experienced earlier in the gym. Denies discomfort at present. Support and encouragement provided.

## 2023-04-05 NOTE — Progress Notes (Addendum)
Pt denied SI/HI/AVH this morning. Pt denied having any pain or concerns related to fall yesterday. Pt provided with towels and linen per request and encouraged to bathe. Pt has been pleasant and calm throughout the shift. RN provided support and encouragement to patient. Pt given scheduled medications as prescribed. Q15 min checks verified for safety. Patient verbally contracts for safety. Patient compliant with medications and treatment plan. Pt is safe on the unit.   04/05/23 0800  Psych Admission Type (Psych Patients Only)  Admission Status Involuntary  Psychosocial Assessment  Patient Complaints Anxiety;Depression  Eye Contact Fair  Facial Expression Anxious;Sad  Affect Depressed;Sad  Speech Soft  Interaction Minimal  Motor Activity Slow  Appearance/Hygiene Improved  Behavior Characteristics Cooperative  Mood Depressed;Sad  Thought Process  Coherency WDL  Content WDL  Delusions None reported or observed  Perception WDL  Hallucination None reported or observed  Judgment Impaired  Confusion None  Danger to Self  Current suicidal ideation? Denies  Description of Suicide Plan No plan  Self-Injurious Behavior No self-injurious ideation or behavior indicators observed or expressed   Agreement Not to Harm Self Yes  Description of Agreement Verbal  Danger to Others  Danger to Others None reported or observed

## 2023-04-05 NOTE — Group Note (Deleted)
Date:  04/05/2023 Time:  9:33 PM  Group Topic/Focus:  AA Meeting/Group     Participation Level:  {BHH PARTICIPATION EXBMW:41324}  Participation Quality:  {BHH PARTICIPATION QUALITY:22265}  Affect:  {BHH AFFECT:22266}  Cognitive:  {BHH COGNITIVE:22267}  Insight: {BHH Insight2:20797}  Engagement in Group:  {BHH ENGAGEMENT IN MWNUU:72536}  Modes of Intervention:  {BHH MODES OF INTERVENTION:22269}  Additional Comments:  ***  Kennieth Francois 04/05/2023, 9:33 PM

## 2023-04-05 NOTE — BH IP Treatment Plan (Signed)
Interdisciplinary Treatment and Diagnostic Plan Update  04/05/2023 Time of Session: 9:10 AM ( update)  Yolanda [name] MRN: [id number]  Principal Diagnosis: Schizoaffective disorder, depressive type (HCC)  Secondary Diagnoses: Principal Problem:   Schizoaffective disorder, depressive type (HCC) Active Problems:   GERD (gastroesophageal reflux disease)   Intellectual disability   Tobacco use disorder   Current Medications:  Current Facility-Administered Medications  Medication Dose Route Frequency Provider Last Rate Last Admin   acetaminophen (TYLENOL) tablet 650 mg  650 mg Oral Q6H PRN [name], NP   650 mg at 04/04/23 2059   alum & mag hydroxide-simeth (MAALOX/MYLANTA) 200-200-20 MG/5ML suspension 30 mL  30 mL Oral Q4H PRN [name], NP   30 mL at 01/28/23 1530   busPIRone (BUSPAR) tablet 15 mg  15 mg Oral BID [name], NP   15 mg at 04/05/23 0756   cyanocobalamin (VITAMIN B12) injection 1,000 mcg  1,000 mcg Intramuscular Q30 days [name], Nadir, MD   1,000 mcg at 04/03/23 1237   diphenhydrAMINE (BENADRYL) capsule 50 mg  50 mg Oral TID PRN [name], NP   50 mg at 01/15/23 1211   Or   diphenhydrAMINE (BENADRYL) injection 50 mg  50 mg Intramuscular TID PRN [name], NP       feeding supplement (ENSURE ENLIVE / ENSURE PLUS) liquid 237 mL  237 mL Oral TID BM Nkwenti, Doris, NP   237 mL at 04/05/23 0957   haloperidol (HALDOL) tablet 5 mg  5 mg Oral TID PRN [name], NP   5 mg at 01/15/23 1211   Or   haloperidol lactate (HALDOL) injection 5 mg  5 mg Intramuscular TID PRN [name], NP       hydrOXYzine (ATARAX) tablet 25 mg  25 mg Oral TID PRN [name], DO   25 mg at 04/02/23 2041   LORazepam (ATIVAN) tablet 2 mg  2 mg Oral TID PRN [name], NP   2 mg at 01/15/23 1211   Or   LORazepam (ATIVAN) injection 2 mg  2 mg Intramuscular TID PRN [name], NP       magnesium hydroxide (MILK OF MAGNESIA) suspension 30 mL  30 mL Oral Daily PRN  [name], NP   30 mL at 03/28/23 2134   melatonin tablet 5 mg  5 mg Oral QHS Nkwenti, Doris, NP   5 mg at 04/04/23 2057   nicotine (NICODERM CQ - dosed in mg/24 hours) patch 21 mg  21 mg Transdermal Daily [name], DO   21 mg at 03/25/23 4098   nicotine polacrilex (NICORETTE) gum 2 mg  2 mg Oral PRN [name], MD   2 mg at 04/02/23 1816   paliperidone (INVEGA) 24 hr tablet 6 mg  6 mg Oral Daily Nkwenti, Tyler Aas, NP   6 mg at 04/04/23 2057   pantoprazole (PROTONIX) EC tablet 40 mg  40 mg Oral Daily [name], NP   40 mg at 04/05/23 0756   polyethylene glycol (MIRALAX / GLYCOLAX) packet 17 g  17 g Oral Daily PRN [name], NP   17 g at 04/03/23 1829   sertraline (ZOLOFT) tablet 150 mg  150 mg Oral Daily Massengill, Harrold Donath, MD   150 mg at 04/05/23 0756   SUMAtriptan (IMITREX) tablet 25 mg  25 mg Oral BID PRN [name], NP   25 mg at 04/04/23 1832   traZODone (DESYREL) tablet 50 mg  50 mg Oral QHS [name], NP  50 mg at 04/04/23 2057   Vitamin D (Ergocalciferol) (DRISDOL) 1.25 MG (50000 UNIT) capsule 50,000 Units  50,000 Units Oral Q7 days [name], NP   50,000 Units at 03/30/23 1517   PTA Medications: Medications Prior to Admission  Medication Sig Dispense Refill Last Dose   busPIRone (BUSPAR) 15 MG tablet Take 15 mg by mouth 2 (two) times daily. (Patient not taking: Reported on 12/19/2022)      paliperidone (INVEGA SUSTENNA) 156 MG/ML SUSY injection Inject 156 mg into the muscle once. (Patient not taking: Reported on 12/19/2022)      sertraline (ZOLOFT) 50 MG tablet Take 150 mg by mouth daily. (Patient not taking: Reported on 12/19/2022)      traZODone (DESYREL) 100 MG tablet Take 100 mg by mouth at bedtime as needed for sleep. (Patient not taking: Reported on 11/12/2022)       Patient Stressors: Medication change or noncompliance    Patient Strengths: Forensic psychologist fund of knowledge   Treatment Modalities: Medication Management, Group  therapy, Case management,  1 to 1 session with clinician, Psychoeducation, Recreational therapy.   Physician Treatment Plan for Primary Diagnosis: Schizoaffective disorder, depressive type (HCC) Long Term Goal(s): Improvement in symptoms so as ready for discharge   Short Term Goals: Ability to identify and develop effective coping behaviors will improve Ability to maintain clinical measurements within normal limits will improve Compliance with prescribed medications will improve Ability to identify triggers associated with substance abuse/mental health issues will improve Ability to identify changes in lifestyle to reduce recurrence of condition will improve Ability to verbalize feelings will improve Ability to disclose and discuss suicidal ideas Ability to demonstrate self-control will improve  Medication Management: Evaluate patient's response, side effects, and tolerance of medication regimen.  Therapeutic Interventions: 1 to 1 sessions, Unit Group sessions and Medication administration.  Evaluation of Outcomes: Progressing  Physician Treatment Plan for Secondary Diagnosis: Principal Problem:   Schizoaffective disorder, depressive type (HCC) Active Problems:   GERD (gastroesophageal reflux disease)   Intellectual disability   Tobacco use disorder  Long Term Goal(s): Improvement in symptoms so as ready for discharge   Short Term Goals: Ability to identify and develop effective coping behaviors will improve Ability to maintain clinical measurements within normal limits will improve Compliance with prescribed medications will improve Ability to identify triggers associated with substance abuse/mental health issues will improve Ability to identify changes in lifestyle to reduce recurrence of condition will improve Ability to verbalize feelings will improve Ability to disclose and discuss suicidal ideas Ability to demonstrate self-control will improve     Medication Management:  Evaluate patient's response, side effects, and tolerance of medication regimen.  Therapeutic Interventions: 1 to 1 sessions, Unit Group sessions and Medication administration.  Evaluation of Outcomes: Progressing   RN Treatment Plan for Primary Diagnosis: Schizoaffective disorder, depressive type (HCC) Long Term Goal(s): Knowledge of disease and therapeutic regimen to maintain health will improve  Short Term Goals: Ability to remain free from injury will improve, Ability to verbalize frustration and anger appropriately will improve, Ability to participate in decision making will improve, Ability to verbalize feelings will improve, Ability to identify and develop effective coping behaviors will improve, and Compliance with prescribed medications will improve  Medication Management: RN will administer medications as ordered by provider, will assess and evaluate patient's response and provide education to patient for prescribed medication. RN will report any adverse and/or side effects to prescribing provider.  Therapeutic Interventions: 1 on 1 counseling sessions, Psychoeducation, Medication  administration, Evaluate responses to treatment, Monitor vital signs and CBGs as ordered, Perform/monitor CIWA, COWS, AIMS and Fall Risk screenings as ordered, Perform wound care treatments as ordered.  Evaluation of Outcomes: Progressing   LCSW Treatment Plan for Primary Diagnosis: Schizoaffective disorder, depressive type (HCC) Long Term Goal(s): Safe transition to appropriate next level of care at discharge, Engage patient in therapeutic group addressing interpersonal concerns.  Short Term Goals: Engage patient in aftercare planning with referrals and resources, Increase social support, Increase emotional regulation, Facilitate acceptance of mental health diagnosis and concerns, Identify triggers associated with mental health/substance abuse issues, and Increase skills for wellness and  recovery  Therapeutic Interventions: Assess for all discharge needs, 1 to 1 time with Social worker, Explore available resources and support systems, Assess for adequacy in community support network, Educate family and significant other(s) on suicide prevention, Complete Psychosocial Assessment, Interpersonal group therapy.  Evaluation of Outcomes: Progressing   Progress in Treatment: Attending groups: Yes. Participating in groups: Yes. Taking medication as prescribed: Yes. Toleration medication: Yes. Family/Significant other contact made: Yes, individual(s) contacted:  Tamera Reason (Mother) Patient understands diagnosis: Yes. Discussing patient identified problems/goals with staff: Yes. Medical problems stabilized or resolved: Yes. Denies suicidal/homicidal ideation: Yes. Issues/concerns per patient self-inventory:      New problem(s) identified: No, Describe:  None reported   New Short Term/Long Term Goal(s): medication stabilization, elimination of SI thoughts, development of comprehensive mental wellness plan.    Patient Goals:  Coping Skills/Housing   Discharge Plan or Barriers: CSW will continue to follow and assess for appropriate referrals and possible discharge planning.    Reason for Continuation of Hospitalization: Depression Medication stabilization Suicidal ideation   Estimated Length of Stay: Still waiting on placement  Last 3 [place] Suicide Severity Risk Score: Flowsheet Row Ad[facility] (Current) [facility] from 12/19/2022 in [facility] at [facility] from 11/12/2022 in [facility] Emergency Department at [facility]  C-SSRS RISK CATEGORY High Risk High Risk Moderate Risk       Last [facility] 2/9 Scores:     No data to display          Scribe for Treatment Team: Beather Arbour 04/05/2023 3:28 PM

## 2023-04-06 DIAGNOSIS — F251 Schizoaffective disorder, depressive type: Secondary | ICD-10-CM | POA: Diagnosis not present

## 2023-04-06 NOTE — Progress Notes (Signed)
   04/06/23 1000  Psych Admission Type (Psych Patients Only)  Admission Status Involuntary  Psychosocial Assessment  Patient Complaints None  Eye Contact Fair  Facial Expression Sad  Affect Appropriate to circumstance  Speech Logical/coherent  Interaction Childlike;Minimal  Motor Activity Slow;Tremors  Appearance/Hygiene Disheveled  Behavior Characteristics Cooperative;Appropriate to situation  Mood Sad  Thought Process  Coherency WDL  Content WDL  Delusions None reported or observed  Perception WDL  Hallucination None reported or observed  Judgment Impaired  Confusion None  Danger to Self  Current suicidal ideation? Denies  Self-Injurious Behavior No self-injurious ideation or behavior indicators observed or expressed   Danger to Others  Danger to Others None reported or observed

## 2023-04-06 NOTE — Group Note (Signed)
LCSW Group Therapy Note   Group Date: 04/06/2023 Start Time: 1000 End Time: 1100   Type of Therapy and Topic:  Group Therapy: Achieving Balance  Participation Level:  Did Not Attend  Description of Group: This group will help everyone identify area of their lives where they desire more balance. What they desire less of in their lives and identify areas where they need to simplify and or intensify responsibilities. Acknowledging barriers that may interfere with them making changes. Also, what do they need they need to give up to achieve the balanced life that they want.   A lack of balance in work and life is not all uncommon! This exercise can help you understand how to foster work and life balance. It specifically helps you become aware of the areas that do need balance as well as the ones that prevent you from taking action.  Therapeutic Goals:  To develop individualized plans to help improve the strength, mental agility and action step to attain balance in their lives.  To become more aware and encourage clients to look at their situation, identify any stressors, and formulate a plan to prioritize.    Summary of Patient Progress:    Patient was invited but didn't attend  Therapeutic Modalities:  Task Centered  Strength Based Approach   Steffanie Dunn, LCSWA 04/06/2023  2:25 PM

## 2023-04-06 NOTE — Progress Notes (Addendum)
D. Pt initially presented very tired this am, remained in bed until lunch time- getting out of bed and taking a shower only after much encouragement. Per pt's self inventory, pt rated her depression,hopelessness and anxiety a 3/3/5, respectively. [name] stated goal today was "to go to group."  Pt currently denies SI/HI and AVH   A. Labs and vitals monitored. Pt given and educated on medications. Pt supported emotionally and encouraged to express concerns and ask questions.   R. Pt remains safe with 15 minute checks. Will continue POC.

## 2023-04-06 NOTE — Group Note (Signed)
Date:  04/06/2023 Time:  1:22 PM  Group Topic/Focus:  Goals Group:   The focus of this group is to help patients establish daily goals to achieve during treatment and discuss how the patient can incorporate goal setting into their daily lives to aide in recovery.    Participation Level:  Did Not Attend  Participation Quality:      Affect:      Cognitive:      Insight: None  Engagement in Group:      Modes of Intervention:      Additional Comments:     [name] 04/06/2023, 1:22 PM

## 2023-04-06 NOTE — Progress Notes (Signed)
   04/06/23 2308  Psych Admission Type (Psych Patients Only)  Admission Status Involuntary  Psychosocial Assessment  Patient Complaints Crying spells;Depression;Hopelessness  Eye Contact Fair  Facial Expression Sad  Affect Apprehensive  Speech Logical/coherent;Soft  Interaction Childlike  Motor Activity Slow;Tremors  Appearance/Hygiene Disheveled  Behavior Characteristics Appropriate to situation  Mood Depressed;Pleasant  Thought Process  Coherency Concrete thinking  Content Preoccupation  Delusions None reported or observed  Perception WDL  Hallucination None reported or observed  Judgment Limited  Confusion None  Danger to Self  Current suicidal ideation? Denies  Self-Injurious Behavior No self-injurious ideation or behavior indicators observed or expressed   Agreement Not to Harm Self Yes  Description of Agreement verbal  Danger to Others  Danger to Others None reported or observed

## 2023-04-06 NOTE — Group Note (Unsigned)
Date:  04/07/2023 Time:  1:38 AM  Group Topic/Focus:  Wrap-Up Group:   The focus of this group is to help patients review their daily goal of treatment and discuss progress on daily workbooks.    Participation Level:  Active  Participation Quality:  Appropriate and Sharing  Affect:  Appropriate  Cognitive:  Appropriate  Insight: Appropriate  Engagement in Group:  Engaged  Modes of Intervention:  Activity and Socialization  Additional Comments:  The  patient stated that she had a "good" day today. The patient stated that she was able to go outside which made it a good day. The patient rated her day a 7/10. The patient participated in the group activity.   Kennieth Francois 04/07/2023, 1:38 AM

## 2023-04-06 NOTE — Progress Notes (Signed)
[facility] MD Progress Note  04/06/2023 11:40 AM [place] AMERIKA NOURSE  MRN:  [id number]  Principal Problem: Schizoaffective disorder, depressive type (HCC)  Diagnosis: Principal Problem:   Schizoaffective disorder, depressive type (HCC) Active Problems:   GERD (gastroesophageal reflux disease)   Intellectual disability   Tobacco use disorder  Reason for admission: This is the first psychiatric admission in this [facility] in 12 years for this 48 AA female with an extensive hx of mental illnesses & probable polysubstance use disorders. She is admitted to the [facility] from the [facility] hospital with complain of worsening suicidal ideations with plan to stab herself. Per chart review, patient apparently reported at the ED that she has been depressed for a while & has not been taking her mental health medications. After medical evaluation.clearance, she was transferred to the [facility] for further psychiatric evaluation/treatments.   24 hr chart review: Sleep Hours last night: Continues to sleep fairly well. Nursing Concerns: None reported.  No change since yesterday.  No PRNs. Behavioral episodes in the past 24 hrs: She continues to remain isolated to the room most of the time. Medication Compliance: Compliant  Vital Signs in the past 24 hrs: Stable. PRN Medications in the past 24 hrs: none  Patient assessment note: On assessment today, the patient is lying in bed appears somewhat disheveled and withdrawn but in general is unchanged since yesterday.  She continues to deny auditory or visual hallucinations or delusions and no behavioral issues are noted.  She has been compliant with her medications and there has been no significant changes in her medications over the last month.  Still awaiting placement.   Discussed the following psychosocial stressors: Dealing with long-term hospitalization and placement.  Total Time spent with patient:  30 minutes  Past Psychiatric History:  See H & P  Past Medical History:   Past Medical History:  Diagnosis Date   Bipolar affect, depressed (HCC)    Constipation 08/17/2022   Depression    Falls 07/21/2022   Fracture of femoral neck, right, closed (HCC) 01/17/2022   Herpes simplex 08/22/2017   Open fracture dislocation of right elbow joint 01/17/2022    Past Surgical History:  Procedure Laterality Date   NO PAST SURGERIES     SALPINGECTOMY     Family History: History reviewed. No pertinent family history.  Family Psychiatric  History: See H & P  Social History:  Social History   Substance and Sexual Activity  Alcohol Use Yes     Social History   Substance and Sexual Activity  Drug Use Yes   Types: Cocaine, Marijuana    Social History   Socioeconomic History   Marital status: Single    Spouse name: Not on file   Number of children: Not on file   Years of education: Not on file   Highest education level: Not on file  Occupational History   Not on file  Tobacco Use   Smoking status: Every Day   Smokeless tobacco: Not on file  Substance and Sexual Activity   Alcohol use: Yes   Drug use: Yes    Types: Cocaine, Marijuana   Sexual activity: Yes  Other Topics Concern   Not on file  Social History Narrative   Not on file   Social Determinants of Health   Financial Resource Strain: Low Risk  (09/12/2022)   Received from [facility], [facility]   Overall Financial Resource Strain (CARDIA)    Difficulty of Paying Living Expenses: Not hard at all  Food Insecurity: Patient Declined (12/20/2022)   Hunger Vital Sign    Worried About Running Out of Food in the Last Year: Patient declined    Ran Out of Food in the Last Year: Patient declined  Transportation Needs: No Transportation Needs (12/20/2022)   PRAPARE - Administrator, Civil Service (Medical): No    Lack of Transportation (Non-Medical): No  Physical Activity: Not on file  Stress: No Stress Concern Present (07/17/2022)   Received from [facility], [facility] - Occupational Stress Questionnaire    Feeling of Stress : Not at all  Social Connections: Unknown (07/16/2022)   Received from [facility], [facility]   Social Network    Social Network: Not on file   Sleep: Good  Appetite:  Good  Current Medications: Current Facility-Administered Medications  Medication Dose Route Frequency Provider Last Rate Last Admin   acetaminophen (TYLENOL) tablet 650 mg  650 mg Oral Q6H PRN [name], NP   650 mg at 04/05/23 2029   alum & mag hydroxide-simeth (MAALOX/MYLANTA) 200-200-20 MG/5ML suspension 30 mL  30 mL Oral Q4H PRN [name], NP   30 mL at 01/28/23 1530   busPIRone (BUSPAR) tablet 15 mg  15 mg Oral BID [name], NP   15 mg at 04/06/23 0749   cyanocobalamin (VITAMIN B12) injection 1,000 mcg  1,000 mcg Intramuscular Q30 days Sarita Bottom, MD   1,000 mcg at 04/03/23 1237   diphenhydrAMINE (BENADRYL) capsule 50 mg  50 mg Oral TID PRN [name], NP   50 mg at 01/15/23 1211   Or   diphenhydrAMINE (BENADRYL) injection 50 mg  50 mg Intramuscular TID PRN [name], NP       feeding supplement (ENSURE ENLIVE / ENSURE PLUS) liquid 237 mL  237 mL Oral TID BM Nkwenti, Doris, NP   237 mL at 04/06/23 1126   haloperidol (HALDOL) tablet 5 mg  5 mg Oral TID PRN [name], NP   5 mg at 01/15/23 1211   Or   haloperidol lactate (HALDOL) injection 5 mg  5 mg Intramuscular TID PRN [name], NP       hydrOXYzine (ATARAX) tablet 25 mg  25 mg Oral TID PRN [name], DO   25 mg at 04/02/23 2041   LORazepam (ATIVAN) tablet 2 mg  2 mg Oral TID PRN [name], NP   2 mg at 01/15/23 1211   Or   LORazepam (ATIVAN) injection 2 mg  2 mg Intramuscular TID PRN [name], NP       magnesium hydroxide (MILK OF MAGNESIA) suspension 30 mL  30 mL Oral Daily PRN [name], NP   30 mL at 03/28/23 2134   melatonin tablet 5 mg  5 mg Oral QHS Nkwenti, Doris, NP   5 mg at 04/05/23 2030   nicotine  (NICODERM CQ - dosed in mg/24 hours) patch 21 mg  21 mg Transdermal Daily [name], DO   21 mg at 03/25/23 0981   nicotine polacrilex (NICORETTE) gum 2 mg  2 mg Oral PRN [name], MD   2 mg at 04/05/23 1716   paliperidone (INVEGA) 24 hr tablet 6 mg  6 mg Oral Daily [name], NP   6 mg at 04/05/23 2031   pantoprazole (PROTONIX) EC tablet 40 mg  40 mg Oral Daily [name], NP   40 mg at 04/06/23 0749   polyethylene glycol (MIRALAX /  GLYCOLAX) packet 17 g  17 g Oral Daily PRN [name], NP   17 g at 04/03/23 1829   sertraline (ZOLOFT) tablet 150 mg  150 mg Oral Daily Massengill, Nathan, MD   150 mg at 04/06/23 0749   SUMAtriptan (IMITREX) tablet 25 mg  25 mg Oral BID PRN [name], NP   25 mg at 04/04/23 1832   traZODone (DESYREL) tablet 50 mg  50 mg Oral QHS Nkwenti, Doris, NP   50 mg at 04/05/23 2031   Vitamin D (Ergocalciferol) (DRISDOL) 1.25 MG (50000 UNIT) capsule 50,000 Units  50,000 Units Oral Q7 days [name], NP   50,000 Units at 03/30/23 1517   Lab Results:  No results found for this or any previous visit (from the past 48 hour(s)).  Blood Alcohol level:  Lab Results  Component Value Date   ETH <10 12/19/2022   ETH <10 11/21/2019   Metabolic Disorder Labs: Lab Results  Component Value Date   HGBA1C 4.4 (L) 12/21/2022   MPG 79.58 12/21/2022   No results found for: "PROLACTIN" Lab Results  Component Value Date   CHOL 218 (H) 12/21/2022   TRIG 81 12/21/2022   HDL 53 12/21/2022   CHOLHDL 4.1 12/21/2022   VLDL 16 12/21/2022   LDLCALC 149 (H) 12/21/2022   LDLCALC 110 (H) 03/13/2011   Physical Findings: AIMS: Facial and Oral Movements Muscles of Facial Expression: None, normal Lips and Perioral Area: None, normal Jaw: None, normal Tongue: None, normal,Extremity Movements Upper (arms, wrists, hands, fingers): None, normal Lower (legs, knees, ankles, toes): None, normal, Trunk Movements Neck, shoulders, hips: None, normal, Overall  Severity Severity of abnormal movements (highest score from questions above): None, normal Incapacitation due to abnormal movements: None, normal Patient's awareness of abnormal movements (rate only patient's report): No Awareness, Dental Status Current problems with teeth and/or dentures?: No Does patient usually wear dentures?: No  CIWA:    COWS:    AIMS:0 Musculoskeletal: Strength & Muscle Tone: within normal limits Gait & Station: normal Patient leans: N/A  Psychiatric Specialty Exam:  Presentation  General Appearance:  Casual; Disheveled  Eye Contact: Limited to baseline Speech: Decreased amount, decreased tone and volume but at baseline Speech Volume: Decreased  Handedness: Right  Mood and Affect  Mood: Sad and depressed mainly secondary to being in the hospital with no place to go Affect: Congruent; Appropriate  Thought Process  Thought Processes: Linear  Descriptions of Associations:Intact  Orientation:Full (Time, Place and Person)  Thought Content:Rumination; Perseveration Concrete History of Schizophrenia/Schizoaffective disorder:Yes  Duration of Psychotic Symptoms:Greater than six months  Hallucinations:Hallucinations: None    Ideas of Reference: None  Suicidal Thoughts:Suicidal Thoughts: No    Homicidal Thoughts:Homicidal Thoughts: No   Sensorium  Memory: Immediate Fair; Remote Fair; Recent Fair  Judgment: Fair  Insight: Fair  Art therapist  Concentration: Fair  Attention Span: Fair  Recall: [employer] of Knowledge: Fair  Language: Fair  Psychomotor Activity  Psychomotor Activity: Psychomotor Activity: Normal     Assets  Assets: Communication Skills; Desire for Improvement; Resilience  Sleep  Sleep: Sleep: Good   Physical Exam: Physical Exam Vitals and nursing note reviewed.  HENT:     Nose: Nose normal.     Mouth/Throat:     Pharynx: Oropharynx is clear.  Eyes:     Pupils: Pupils are  equal, round, and reactive to light.  Cardiovascular:     Rate and Rhythm: Normal rate.     Pulses: Normal pulses.  Pulmonary:  Effort: Pulmonary effort is normal.  Genitourinary:    Comments: Deferred Musculoskeletal:        General: Normal range of motion.     Cervical back: Normal range of motion.  Skin:    General: Skin is warm and dry.  Neurological:     General: No focal deficit present.     Mental Status: She is alert and oriented to person, place, and time.  Psychiatric:        Mood and Affect: Mood normal.        Behavior: Behavior normal.        Thought Content: Thought content normal.    Review of Systems  Constitutional:  Negative for chills, diaphoresis and fever.  HENT:  Negative for congestion, hearing loss and sore throat.   Eyes:  Negative for blurred vision.  Respiratory:  Negative for cough, shortness of breath and wheezing.   Cardiovascular:  Negative for chest pain and palpitations.  Gastrointestinal:  Negative for abdominal pain, constipation, diarrhea, heartburn, nausea and vomiting.  Genitourinary:  Negative for dysuria.  Musculoskeletal:  Negative for joint pain and myalgias.  Skin:  Negative for rash.  Neurological:  Negative for dizziness.  Psychiatric/Behavioral:  Positive for depression. Negative for hallucinations, memory loss, substance abuse and suicidal ideas. The patient is nervous/anxious and has insomnia.   All other systems reviewed and are negative.  Blood pressure (!) 106/58, pulse 91, temperature (!) 97.5 F (36.4 C), temperature source Oral, resp. rate 14, height 4\' 11"  (1.499 m), weight 63 kg, SpO2 97%. Body mass index is 28.05 kg/m.  Treatment Plan Summary: Daily contact with patient to assess and evaluate symptoms and progress in treatment and Medication management.    Still waiting on safe disposition as patient would not be able to live independently given cognitive impairment. Recommend assisted living.    No medication  side effects reported.  Because of urinary incontinence, recommended adult diapers to be worn.   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  Plan:  -Continue Imitrex 25 mg PRN BID for migraines -Continue Colace 100 mg daily for constipation -Continue Vitamin D 50.000 units weekly for bone health. -Continue Sertraline150 mg po Q daily for depression/anxiety -Continue Buspar 15 mg po bid for anxiety.  -Continue paliperidone 6 mg po qd for mood stabilization -Continue Trazodone 50 mg nightly for insomnia  -Continue Nicoderm 21 mg topically Q 24 hrs for nicotine withdrawal management. -Continue vitamin B12 1000 mcg IM weekly for 4 weeks then monthly -Continue Melatonin 5 mg nightly for sleep -Continue Protonix EC 40 mg p.o. daily for GERD -Continue MiraLAX 17 g p.o. PRN for constipation -Continue hydroxyzine 25 mg p.o. 3 times daily as needed for anxiety -Continue Ensure nutritional shakes TID in between meals  -Previously discontinued Hydroxyzine 50 mg nightly-hypotension in the mornings   Safety and Monitoring: Voluntary admission to inpatient psychiatric unit for safety, stabilization and treatment Daily contact with patient to assess and evaluate symptoms and progress in treatment Patient's case to be discussed in multi-disciplinary team meeting Observation Level : q15 minute checks Vital signs: q12 hours Precautions: Safety   Discharge Planning: Social work and case management to assist with discharge planning and identification of hospital follow-up needs prior to discharge Estimated LOS: Unknown at this time. Discharge Concerns: Need to establish a safety plan; Medication compliance and effectiveness Discharge Goals: Return  home with outpatient  referrals for mental health follow-up including medication management/psychotherapy No legal guardian, has payee  [name], MD,  04/06/2023, 11:40 AM Patient ID: Yolanda [name], female   DOB: 02-27-1976, 47 y.o.   MRN: [id number]  Patient ID: LAELANI VASKO, female   DOB: [birth year], 47 y.o.   MRN: [id number] Patient ID: NATARA MONFORT, female   DOB: [birth year], 47 y.o.   MRN: [id number] Patient ID: CHRISTYANN MANOLIS, female   DOB: January 02, 1976, 47 y.o.   MRN: [id number]

## 2023-04-06 NOTE — Plan of Care (Signed)
  Problem: Activity: Goal: Interest or engagement in leisure activities will improve Outcome: Progressing   

## 2023-04-07 DIAGNOSIS — F251 Schizoaffective disorder, depressive type: Secondary | ICD-10-CM | POA: Diagnosis not present

## 2023-04-07 NOTE — Group Note (Signed)
Date:  04/07/2023 Time:  11:09 AM  Group Topic/Focus:  Goals Group:   The focus of this group is to help patients establish daily goals to achieve during treatment and discuss how the patient can incorporate goal setting into their daily lives to aide in recovery.    Participation Level:  Did Not Attend  Additional Comments:  Patient was encouraged to attend group multiple times.   Elnor Renovato T Rakia Frayne 04/07/2023, 11:09 AM

## 2023-04-07 NOTE — Progress Notes (Signed)
[facility] MD Progress Note  04/07/2023 8:19 AM Yolanda [name]  MRN:  [id number]  Principal Problem: Schizoaffective disorder, depressive type (HCC)  Diagnosis: Principal Problem:   Schizoaffective disorder, depressive type (HCC) Active Problems:   GERD (gastroesophageal reflux disease)   Intellectual disability   Tobacco use disorder  Reason for admission: This is the first psychiatric admission in this [facility] in 12 years for this 47 AA female with an extensive hx of mental illnesses & probable polysubstance use disorders. She is admitted to the [facility] from the [facility] hospital with complain of worsening suicidal ideations with plan to stab herself. Per chart review, patient apparently reported at the ED that she has been depressed for a while & has not been taking her mental health medications. After medical evaluation.clearance, she was transferred to the [facility] for further psychiatric evaluation/treatments.   24 hr chart review: Sleep Hours last night: Staff reports 8.25 hours of sleep. Nursing Concerns: None reported.  No change since yesterday.  No PRNs. Behavioral episodes in the past 24 hrs: She continues to remain isolated to the room most of the time.  Attends some groups. Medication Compliance: Compliant  Vital Signs in the past 24 hrs: Stable. PRN Medications in the past 24 hrs: none  Patient assessment note: On assessment today, the patient was noted to be lying in bed but awake.  She likes to do some crossword puzzles at times and she appears alert and cooperative.  She continues to deny SI/HI/AVH.  No change since yesterday.  Still awaiting placement.   Discussed the following psychosocial stressors: Dealing with long-term hospitalization and placement.  Total Time spent with patient:  30 minutes  Past Psychiatric History:  See H & P  Past Medical History:  Past Medical History:  Diagnosis Date   Bipolar affect, depressed (HCC)    Constipation 08/17/2022   Depression     Falls 07/21/2022   Fracture of femoral neck, right, closed (HCC) 01/17/2022   Herpes simplex 08/22/2017   Open fracture dislocation of right elbow joint 01/17/2022    Past Surgical History:  Procedure Laterality Date   NO PAST SURGERIES     SALPINGECTOMY     Family History: History reviewed. No pertinent family history.  Family Psychiatric  History: See H & P  Social History:  Social History   Substance and Sexual Activity  Alcohol Use Yes     Social History   Substance and Sexual Activity  Drug Use Yes   Types: Cocaine, Marijuana    Social History   Socioeconomic History   Marital status: Single    Spouse name: Not on file   Number of children: Not on file   Years of education: Not on file   Highest education level: Not on file  Occupational History   Not on file  Tobacco Use   Smoking status: Every Day   Smokeless tobacco: Not on file  Substance and Sexual Activity   Alcohol use: Yes   Drug use: Yes    Types: Cocaine, Marijuana   Sexual activity: Yes  Other Topics Concern   Not on file  Social History Narrative   Not on file   Social Determinants of Health   Financial Resource Strain: Low Risk  (09/12/2022)   Received from [facility], [facility]   Overall Financial Resource Strain (CARDIA)    Difficulty of Paying Living Expenses: Not hard at all  Food Insecurity: Patient Declined (12/20/2022)   Hunger Vital Sign  Worried About Programme researcher, broadcasting/film/video in the Last Year: Patient declined    Barista in the Last Year: Patient declined  Transportation Needs: No Transportation Needs (12/20/2022)   PRAPARE - Administrator, Civil Service (Medical): No    Lack of Transportation (Non-Medical): No  Physical Activity: Not on file  Stress: No Stress Concern Present (07/17/2022)   Received from [facility], [facility] - Occupational Stress Questionnaire    Feeling of Stress : Not at all  Social  Connections: Unknown (07/16/2022)   Received from [facility], [facility]   Social Network    Social Network: Not on file   Sleep: Good  Appetite:  Good  Current Medications: Current Facility-Administered Medications  Medication Dose Route Frequency Provider Last Rate Last Admin   acetaminophen (TYLENOL) tablet 650 mg  650 mg Oral Q6H PRN [name], NP   650 mg at 04/05/23 2029   alum & mag hydroxide-simeth (MAALOX/MYLANTA) 200-200-20 MG/5ML suspension 30 mL  30 mL Oral Q4H PRN [name], NP   30 mL at 01/28/23 1530   busPIRone (BUSPAR) tablet 15 mg  15 mg Oral BID [name], NP   15 mg at 04/07/23 0756   cyanocobalamin (VITAMIN B12) injection 1,000 mcg  1,000 mcg Intramuscular Q30 days [name], Nadir, MD   1,000 mcg at 04/03/23 1237   diphenhydrAMINE (BENADRYL) capsule 50 mg  50 mg Oral TID PRN [name], NP   50 mg at 01/15/23 1211   Or   diphenhydrAMINE (BENADRYL) injection 50 mg  50 mg Intramuscular TID PRN [name], NP       feeding supplement (ENSURE ENLIVE / ENSURE PLUS) liquid 237 mL  237 mL Oral TID BM Nkwenti, Doris, NP   237 mL at 04/06/23 1946   haloperidol (HALDOL) tablet 5 mg  5 mg Oral TID PRN [name], NP   5 mg at 01/15/23 1211   Or   haloperidol lactate (HALDOL) injection 5 mg  5 mg Intramuscular TID PRN [name], NP       hydrOXYzine (ATARAX) tablet 25 mg  25 mg Oral TID PRN [name], DO   25 mg at 04/02/23 2041   LORazepam (ATIVAN) tablet 2 mg  2 mg Oral TID PRN [name], NP   2 mg at 01/15/23 1211   Or   LORazepam (ATIVAN) injection 2 mg  2 mg Intramuscular TID PRN [name], NP       magnesium hydroxide (MILK OF MAGNESIA) suspension 30 mL  30 mL Oral Daily PRN [name], NP   30 mL at 04/06/23 1824   melatonin tablet 5 mg  5 mg Oral QHS Nkwenti, Doris, NP   5 mg at 04/06/23 2058   nicotine (NICODERM CQ - dosed in mg/24 hours) patch 21 mg  21 mg Transdermal Daily [name], DO   21 mg at 03/25/23 6295    nicotine polacrilex (NICORETTE) gum 2 mg  2 mg Oral PRN [name], MD   2 mg at 04/06/23 1611   paliperidone (INVEGA) 24 hr tablet 6 mg  6 mg Oral Daily [name], NP   6 mg at 04/06/23 2058   pantoprazole (PROTONIX) EC tablet 40 mg  40 mg Oral Daily [name], NP   40 mg at 04/07/23 0756   polyethylene glycol (MIRALAX / GLYCOLAX) packet 17 g  17 g Oral Daily PRN [name],  NP   17 g at 04/03/23 1829   sertraline (ZOLOFT) tablet 150 mg  150 mg Oral Daily Massengill, Nathan, MD   150 mg at 04/07/23 0756   SUMAtriptan (IMITREX) tablet 25 mg  25 mg Oral BID PRN [name], NP   25 mg at 04/04/23 1832   traZODone (DESYREL) tablet 50 mg  50 mg Oral QHS [name], NP   50 mg at 04/06/23 2058   Vitamin D (Ergocalciferol) (DRISDOL) 1.25 MG (50000 UNIT) capsule 50,000 Units  50,000 Units Oral Q7 days [name], NP   50,000 Units at 04/06/23 1725   Lab Results:  No results found for this or any previous visit (from the past 48 hour(s)).  Blood Alcohol level:  Lab Results  Component Value Date   ETH <10 12/19/2022   ETH <10 11/21/2019   Metabolic Disorder Labs: Lab Results  Component Value Date   HGBA1C 4.4 (L) 12/21/2022   MPG 79.58 12/21/2022   No results found for: "PROLACTIN" Lab Results  Component Value Date   CHOL 218 (H) 12/21/2022   TRIG 81 12/21/2022   HDL 53 12/21/2022   CHOLHDL 4.1 12/21/2022   VLDL 16 12/21/2022   LDLCALC 149 (H) 12/21/2022   LDLCALC 110 (H) 03/13/2011   Physical Findings: AIMS: Facial and Oral Movements Muscles of Facial Expression: None, normal Lips and Perioral Area: None, normal Jaw: None, normal Tongue: None, normal,Extremity Movements Upper (arms, wrists, hands, fingers): None, normal Lower (legs, knees, ankles, toes): None, normal, Trunk Movements Neck, shoulders, hips: None, normal, Overall Severity Severity of abnormal movements (highest score from questions above): None, normal Incapacitation due to abnormal  movements: None, normal Patient's awareness of abnormal movements (rate only patient's report): No Awareness, Dental Status Current problems with teeth and/or dentures?: No Does patient usually wear dentures?: No  CIWA:    COWS:    AIMS:0 Musculoskeletal: Strength & Muscle Tone: within normal limits Gait & Station: normal Patient leans: N/A  Psychiatric Specialty Exam:  Presentation  General Appearance:  Disheveled; Casual  Eye Contact: Limited to baseline Speech: Decreased amount, decreased tone and volume but at baseline Speech Volume: Decreased  Handedness: Right  Mood and Affect  Mood: Sad and depressed mainly secondary to being in the hospital with no place to go Affect: Restricted  Thought Process  Thought Processes: Coherent  Descriptions of Associations:Intact  Orientation:Full (Time, Place and Person)  Thought Content:Perseveration Concrete History of Schizophrenia/Schizoaffective disorder:Yes  Duration of Psychotic Symptoms:Greater than six months  Hallucinations:Hallucinations: None    Ideas of Reference: None  Suicidal Thoughts:Suicidal Thoughts: No    Homicidal Thoughts:Homicidal Thoughts: No   Sensorium  Memory: Immediate Fair; Recent Fair; Remote Fair  Judgment: Fair  Insight: Fair  Art therapist  Concentration: Fair  Attention Span: Fair  Recall: [employer] of Knowledge: Fair  Language: Fair  Psychomotor Activity  Psychomotor Activity: Psychomotor Activity: Normal     Assets  Assets: Desire for Improvement; Social Support  Sleep  Sleep: Sleep: Fair   Physical Exam: Physical Exam Vitals and nursing note reviewed.  HENT:     Nose: Nose normal.     Mouth/Throat:     Pharynx: Oropharynx is clear.  Eyes:     Pupils: Pupils are equal, round, and reactive to light.  Cardiovascular:     Rate and Rhythm: Normal rate.     Pulses: Normal pulses.  Pulmonary:     Effort: Pulmonary effort is  normal.  Genitourinary:    Comments: Deferred  Musculoskeletal:        General: Normal range of motion.     Cervical back: Normal range of motion.  Skin:    General: Skin is warm and dry.  Neurological:     General: No focal deficit present.     Mental Status: She is alert and oriented to person, place, and time.  Psychiatric:        Mood and Affect: Mood normal.        Behavior: Behavior normal.        Thought Content: Thought content normal.    Review of Systems  Constitutional:  Negative for chills, diaphoresis and fever.  HENT:  Negative for congestion, hearing loss and sore throat.   Eyes:  Negative for blurred vision.  Respiratory:  Negative for cough, shortness of breath and wheezing.   Cardiovascular:  Negative for chest pain and palpitations.  Gastrointestinal:  Negative for abdominal pain, constipation, diarrhea, heartburn, nausea and vomiting.  Genitourinary:  Negative for dysuria.  Musculoskeletal:  Negative for joint pain and myalgias.  Skin:  Negative for rash.  Neurological:  Negative for dizziness.  Psychiatric/Behavioral:  Positive for depression. Negative for hallucinations, memory loss, substance abuse and suicidal ideas. The patient is nervous/anxious and has insomnia.   All other systems reviewed and are negative.  Blood pressure 107/65, pulse 66, temperature (!) 97.5 F (36.4 C), temperature source Oral, resp. rate 14, height 4\' 11"  (1.499 m), weight 63 kg, SpO2 97%. Body mass index is 28.05 kg/m.  Treatment Plan Summary: Daily contact with patient to assess and evaluate symptoms and progress in treatment and Medication management.    Still waiting on safe disposition as patient would not be able to live independently given cognitive impairment. Recommend assisted living.    No medication side effects reported.  Because of urinary incontinence, recommended adult diapers to be worn.   Principal/active diagnoses:  Schizoaffective disorder, depressive  type (HCC)  Active Problems: GERD (gastroesophageal reflux disease) Intellectual disability Tobacco use disorder (MOCA 16/30) moderate cognitive impairment probably related to moderate intellectual disability.  Plan:  -Continue Imitrex 25 mg PRN BID for migraines -Continue Colace 100 mg daily for constipation -Continue Vitamin D 50.000 units weekly for bone health. -Continue Sertraline150 mg po Q daily for depression/anxiety -Continue Buspar 15 mg po bid for anxiety.  -Continue paliperidone 6 mg po qd for mood stabilization -Continue Trazodone 50 mg nightly for insomnia  -Continue Nicoderm 21 mg topically Q 24 hrs for nicotine withdrawal management. -Continue vitamin B12 1000 mcg IM weekly for 4 weeks then monthly -Continue Melatonin 5 mg nightly for sleep -Continue Protonix EC 40 mg p.o. daily for GERD -Continue MiraLAX 17 g p.o. PRN for constipation -Continue hydroxyzine 25 mg p.o. 3 times daily as needed for anxiety -Continue Ensure nutritional shakes TID in between meals  -Previously discontinued Hydroxyzine 50 mg nightly-hypotension in the mornings   Safety and Monitoring: Voluntary admission to inpatient psychiatric unit for safety, stabilization and treatment Daily contact with patient to assess and evaluate symptoms and progress in treatment Patient's case to be discussed in multi-disciplinary team meeting Observation Level : q15 minute checks Vital signs: q12 hours Precautions: Safety   Discharge Planning: Social work and case management to assist with discharge planning and identification of hospital follow-up needs prior to discharge Estimated LOS: Unknown at this time. Discharge Concerns: Need to establish a safety plan; Medication compliance and effectiveness Discharge Goals: Return home with outpatient referrals for mental health follow-up including medication management/psychotherapy No legal  guardian, has payee  [name], MD,  04/07/2023, 8:19  AM Patient ID: Yolanda [name], female   DOB: Nov 19, 1975, 47 y.o.   MRN: [id number]  Patient ID: Yolanda [name], female   DOB: 1975/10/06, 47 y.o.   MRN: [id number] Patient ID: Yolanda [name], female   DOB: April 14, 1976, 47 y.o.   MRN: [id number] Patient ID: Yolanda [name], female   DOB: December 19, 1975, 47 y.o.   MRN: [id number] Patient ID: Yolanda [name], female   DOB: 1975-11-25, 47 y.o.   MRN: [id number]

## 2023-04-07 NOTE — Progress Notes (Signed)
Patient denied SI/HI and AVH. Medication compliant. Pt compliant with outdoor group activity on shift. Safety maintained.  04/07/23 0825  Psych Admission Type (Psych Patients Only)  Admission Status Involuntary  Psychosocial Assessment  Patient Complaints Depression  Eye Contact Fair  Facial Expression Sad  Affect Depressed  Speech Logical/coherent  Interaction Childlike  Motor Activity Slow;Tremors  Appearance/Hygiene Disheveled  Behavior Characteristics Appropriate to situation  Mood Depressed  Thought Process  Coherency WDL  Content WDL  Delusions None reported or observed  Perception WDL  Hallucination None reported or observed  Judgment Limited  Confusion None  Danger to Self  Current suicidal ideation? Denies  Self-Injurious Behavior No self-injurious ideation or behavior indicators observed or expressed   Agreement Not to Harm Self Yes  Description of Agreement Verbal  Danger to Others  Danger to Others None reported or observed

## 2023-04-07 NOTE — Plan of Care (Signed)
  Problem: Activity: Goal: Interest or engagement in leisure activities will improve Outcome: Progressing Goal: Imbalance in normal sleep/wake cycle will improve Outcome: Progressing   Problem: Coping: Goal: Coping ability will improve Outcome: Progressing Goal: Will verbalize feelings Outcome: Progressing   Problem: Medication: Goal: Compliance with prescribed medication regimen will improve Outcome: Progressing   Problem: Safety: Goal: Periods of time without injury will increase Outcome: Progressing

## 2023-04-07 NOTE — Progress Notes (Signed)
   04/07/23 2159  Psych Admission Type (Psych Patients Only)  Admission Status Involuntary  Psychosocial Assessment  Patient Complaints Crying spells;Depression  Eye Contact Fair  Facial Expression Sad  Affect Depressed  Speech Logical/coherent;Soft  Interaction Childlike  Motor Activity Slow;Tremors  Appearance/Hygiene Disheveled  Behavior Characteristics Appropriate to situation  Mood Depressed;Pleasant  Thought Process  Coherency WDL  Content WDL  Delusions None reported or observed  Perception WDL  Hallucination None reported or observed  Judgment Limited  Confusion None  Danger to Self  Current suicidal ideation? Denies  Self-Injurious Behavior No self-injurious ideation or behavior indicators observed or expressed   Agreement Not to Harm Self Yes  Description of Agreement verbal  Danger to Others  Danger to Others None reported or observed

## 2023-04-07 NOTE — [facility] Group Notes (Signed)
[facility] Group Notes:  (Nursing/MHT/Case Management/Adjunct)  Date:  04/07/2023  Time:  9:40 PM  Type of Therapy:  The focus of this group is to help patients establish daily goals to achieve during treatment and discuss how the patient can incorporate goal setting into their daily lives to aide in recovery.    Participation Level:  Active  Participation Quality:  Sharing and Supportive  Affect:  Appropriate  Cognitive:  Appropriate  Insight:  Appropriate and Good  Engagement in Group:  Engaged and Supportive  Modes of Intervention:  Socialization and Support  Summary of Progress/Problems: Pt attended group  Yolanda [name] 04/07/2023, 9:40 PM

## 2023-04-07 NOTE — Plan of Care (Signed)
  Problem: Education: Goal: Utilization of techniques to improve thought processes will improve Outcome: Progressing   Problem: Activity: Goal: Imbalance in normal sleep/wake cycle will improve Outcome: Progressing

## 2023-04-07 NOTE — Progress Notes (Signed)
   04/07/23 0708  15 Minute Checks  Location Bedroom  Visual Appearance Calm  Behavior Composed  Sleep (Behavioral Health Patients Only)  Calculate sleep? (Click Yes once per 24 hr at 0600 safety check) Yes  Documented sleep last 24 hours 8.25

## 2023-04-08 DIAGNOSIS — F251 Schizoaffective disorder, depressive type: Secondary | ICD-10-CM | POA: Diagnosis not present

## 2023-04-08 NOTE — Group Note (Signed)
Recreation Therapy Group Note   Group Topic:Stress Management  Group Date: 04/08/2023 Start Time: 1610 End Time: 0956 Facilitators: Stephannie Broner-McCall, LRT,CTRS Location: [address] Dayroom   Group Topic: Stress Management  Goal Area(s) Addresses:  Patient will identify positive stress management techniques. Patient will identify benefits of using stress management post d/c.  Group Description: Meditation. LRT played a meditation from the Calm app that focused on taking in the characteristics of a mountain. It encouraged participates to envision how the mountain stands tall and endures whatever it's confronted with (ie. Changing weather, different seasons, time of day) and encouraged patients to take on that same attitude when they are faced with the challenges of life.   Education:  Stress Management, Discharge Planning.   Education Outcome: Acknowledges Education   Affect/Mood: N/A   Participation Level: Did not attend    Clinical Observations/Individualized Feedback:     Plan: Continue to engage patient in RT group sessions 2-3x/week.   Yolanda [name], LRT,CTRS  04/08/2023 11:48 AM

## 2023-04-08 NOTE — [facility] Group Notes (Signed)
Spiritual care group on grief and loss facilitated by Chaplain Katy Climmie Buelow, [facility]  Group Goal: Support / Education around grief and loss  Members engage in facilitated group support and psycho-social education.  Group Description:  Following introductions and group rules, group members engaged in facilitated group dialogue and support around topic of loss, with particular support around experiences of loss in their lives. Group Identified types of loss (relationships / self / things) and identified patterns, circumstances, and changes that precipitate losses. Reflected on thoughts / feelings around loss, normalized grief responses, and recognized variety in grief experience. Group encouraged individual reflection on safe space and on the coping skills that they are already utilizing.  Group drew on Adlerian / Rogerian and narrative framework  Patient Progress: Did not attend.  

## 2023-04-08 NOTE — Progress Notes (Signed)
   04/08/23 1478  Psych Admission Type (Psych Patients Only)  Admission Status Involuntary  Psychosocial Assessment  Patient Complaints Depression  Eye Contact Fair  Facial Expression Sad  Affect Depressed  Speech Logical/coherent  Interaction Childlike  Motor Activity Slow;Tremors  Appearance/Hygiene Disheveled  Behavior Characteristics Appropriate to situation  Mood Depressed;Pleasant  Thought Process  Coherency WDL  Content WDL  Delusions None reported or observed  Perception WDL  Hallucination None reported or observed  Judgment Limited  Confusion None  Danger to Self  Current suicidal ideation? Denies  Self-Injurious Behavior No self-injurious ideation or behavior indicators observed or expressed   Agreement Not to Harm Self Yes  Description of Agreement Verbal  Danger to Others  Danger to Others None reported or observed

## 2023-04-08 NOTE — Group Note (Signed)
Date:  04/08/2023 Time:  9:13 AM  Group Topic/Focus:  Goals Group:   The focus of this group is to help patients establish daily goals to achieve during treatment and discuss how the patient can incorporate goal setting into their daily lives to aide in recovery.    Participation Level:  Did Not Attend  Additional Comments:  Pt chose not to attend group.   Donell Beers 04/08/2023, 9:13 AM

## 2023-04-08 NOTE — Group Note (Signed)
Date:  04/08/2023 Time:  11:34 PM  Group Topic/Focus:  Recovery Goals:   The focus of this group is to identify appropriate goals for recovery and establish a plan to achieve them.    Participation Level:  Active  Participation Quality:  Appropriate  Affect:  Appropriate  Cognitive:  Appropriate  Insight: Appropriate  Engagement in Group:  Supportive  Modes of Intervention:  Support  Additional Comments:    Jency Schnieders 04/08/2023, 11:34 PM

## 2023-04-08 NOTE — Progress Notes (Signed)
Patient complained of burning on urination. NP notified. Urinalysis collected as ordered. Safety maintained.

## 2023-04-08 NOTE — Plan of Care (Signed)
  Problem: Activity: Goal: Interest or engagement in leisure activities will improve Outcome: Progressing Goal: Imbalance in normal sleep/wake cycle will improve Outcome: Progressing   Problem: Coping: Goal: Coping ability will improve Outcome: Progressing Goal: Will verbalize feelings Outcome: Progressing   Problem: Medication: Goal: Compliance with prescribed medication regimen will improve Outcome: Progressing   Problem: Safety: Goal: Periods of time without injury will increase Outcome: Progressing

## 2023-04-08 NOTE — Progress Notes (Signed)
   04/08/23 0530  15 Minute Checks  Location Bedroom  Visual Appearance Calm  Behavior Sleeping  Sleep (Behavioral Health Patients Only)  Calculate sleep? (Click Yes once per 24 hr at 0600 safety check) Yes  Documented sleep last 24 hours 7.75

## 2023-04-08 NOTE — Progress Notes (Signed)
[facility] MD Progress Note  04/08/2023 3:08 PM Yolanda [name]  MRN:  [id number]  Principal Problem: Schizoaffective disorder, depressive type (HCC)  Diagnosis: Principal Problem:   Schizoaffective disorder, depressive type (HCC) Active Problems:   GERD (gastroesophageal reflux disease)   Intellectual disability   Tobacco use disorder  Reason for admission: This is the first psychiatric admission in this [facility] in 12 years for this 47 AA female with an extensive hx of mental illnesses & probable polysubstance use disorders. She is admitted to the [facility] from the [facility] hospital with complain of worsening suicidal ideations with plan to stab herself. Per chart review, patient apparently reported at the ED that she has been depressed for a while & has not been taking her mental health medications. After medical evaluation.clearance, she was transferred to the [facility] for further psychiatric evaluation/treatments.   24 hr chart review: Sleep Hours last night: No change continues to sleep fairly well. Slept for 7.75 hrs as per nursing documentation. Nursing Concerns: Isolating in her room Behavioral episodes in the past 24 hrs: As above Medication Compliance: Compliant  Vital Signs in the past 24 hrs: WNL. PRN Medications in the past 24 FAO:[id number] gum  Patient assessment note: Pt with flat affect and depressed mood, attention to personal hygiene and grooming is fair, eye contact is good, speech is clear & coherent. Thought contents are organized and logical, and pt currently denies SI/HI/AVH or paranoia. There is no evidence of delusional thoughts.    Pt verbalizes being displaced at the continuous hospitalization.  She is isolative to her room, states that there is nothing else that she stands to benefit from remaining hospitalized.  She is repeatedly requesting to be discharged, but has safety remains a priority at this time, along with the fact that she is unable to care for herself independently  in the community in a shelter.  Family is also unwilling to take her back home.  Empathy and active listen provided today, as we continue to work with DSS and CSW to find placement that is safe & appropriate for patient in the community.  As per attending psychiatrist, from last note: "Had a meeting with DSS/partner's health care today along with Ernestine Mcmurray from social work.  They failed to require slight modification to help her expand her options from assisted living facility/skilled nursing all the way to [facility].  This modification was done and was sent back to the DSS."  We will continue to coordinate with DSS & CSW regarding getting patient out of the hospital setting into the community as soon as possible.  Symptoms are stable enough for management outpatient.  We will continue medications as listed below, and we will continue to provide support for patient, and continue to positively reinforced for her to get out of her room and engage in unit activities. No TD/EPS type symptoms found on assessment, and pt denies any feelings of stiffness. AIMS: 0.   Total Time spent with patient:  45 minutes  Past Psychiatric History:  See H & P  Past Medical History:  Past Medical History:  Diagnosis Date   Bipolar affect, depressed (HCC)    Constipation 08/17/2022   Depression    Falls 07/21/2022   Fracture of femoral neck, right, closed (HCC) 01/17/2022   Herpes simplex 08/22/2017   Open fracture dislocation of right elbow joint 01/17/2022    Past Surgical History:  Procedure Laterality Date   NO PAST SURGERIES     SALPINGECTOMY  Family History: History reviewed. No pertinent family history.  Family Psychiatric  History: See H & P  Social History:  Social History   Substance and Sexual Activity  Alcohol Use Yes     Social History   Substance and Sexual Activity  Drug Use Yes   Types: Cocaine, Marijuana    Social History   Socioeconomic History   Marital status: Single     Spouse name: Not on file   Number of children: Not on file   Years of education: Not on file   Highest education level: Not on file  Occupational History   Not on file  Tobacco Use   Smoking status: Every Day   Smokeless tobacco: Not on file  Substance and Sexual Activity   Alcohol use: Yes   Drug use: Yes    Types: Cocaine, Marijuana   Sexual activity: Yes  Other Topics Concern   Not on file  Social History Narrative   Not on file   Social Determinants of Health   Financial Resource Strain: Low Risk  (09/12/2022)   Received from [facility], [facility]   Overall Financial Resource Strain (CARDIA)    Difficulty of Paying Living Expenses: Not hard at all  Food Insecurity: Patient Declined (12/20/2022)   Hunger Vital Sign    Worried About Running Out of Food in the Last Year: Patient declined    Ran Out of Food in the Last Year: Patient declined  Transportation Needs: No Transportation Needs (12/20/2022)   PRAPARE - Administrator, Civil Service (Medical): No    Lack of Transportation (Non-Medical): No  Physical Activity: Not on file  Stress: No Stress Concern Present (07/17/2022)   Received from [facility], [facility] - Occupational Stress Questionnaire    Feeling of Stress : Not at all  Social Connections: Unknown (07/16/2022)   Received from [facility], [facility]   Social Network    Social Network: Not on file   Sleep: Good  Appetite:  Good  Current Medications: Current Facility-Administered Medications  Medication Dose Route Frequency Provider Last Rate Last Admin   acetaminophen (TYLENOL) tablet 650 mg  650 mg Oral Q6H PRN [name], NP   650 mg at 04/05/23 2029   alum & mag hydroxide-simeth (MAALOX/MYLANTA) 200-200-20 MG/5ML suspension 30 mL  30 mL Oral Q4H PRN [name], NP   30 mL at 01/28/23 1530   busPIRone (BUSPAR) tablet 15 mg  15 mg Oral BID [name], NP   15 mg at 04/08/23  1610   cyanocobalamin (VITAMIN B12) injection 1,000 mcg  1,000 mcg Intramuscular Q30 days Sarita Bottom, MD   1,000 mcg at 04/03/23 1237   diphenhydrAMINE (BENADRYL) capsule 50 mg  50 mg Oral TID PRN [name], NP   50 mg at 01/15/23 1211   Or   diphenhydrAMINE (BENADRYL) injection 50 mg  50 mg Intramuscular TID PRN [name], NP       feeding supplement (ENSURE ENLIVE / ENSURE PLUS) liquid 237 mL  237 mL Oral TID BM Magda Muise, NP   237 mL at 04/08/23 1452   haloperidol (HALDOL) tablet 5 mg  5 mg Oral TID PRN [name], NP   5 mg at 01/15/23 1211   Or   haloperidol lactate (HALDOL) injection 5 mg  5 mg Intramuscular TID PRN [name], NP       hydrOXYzine (ATARAX) tablet 25 mg  25  mg Oral TID PRN [name], DO   25 mg at 04/02/23 2041   LORazepam (ATIVAN) tablet 2 mg  2 mg Oral TID PRN [name], NP   2 mg at 01/15/23 1211   Or   LORazepam (ATIVAN) injection 2 mg  2 mg Intramuscular TID PRN [name], NP       magnesium hydroxide (MILK OF MAGNESIA) suspension 30 mL  30 mL Oral Daily PRN [name], NP   30 mL at 04/06/23 1824   melatonin tablet 5 mg  5 mg Oral QHS Layden Caterino, NP   5 mg at 04/07/23 2106   nicotine (NICODERM CQ - dosed in mg/24 hours) patch 21 mg  21 mg Transdermal Daily [name], DO   21 mg at 03/25/23 1610   nicotine polacrilex (NICORETTE) gum 2 mg  2 mg Oral PRN [name], MD   2 mg at 04/08/23 1454   paliperidone (INVEGA) 24 hr tablet 6 mg  6 mg Oral Daily [name], NP   6 mg at 04/07/23 2106   pantoprazole (PROTONIX) EC tablet 40 mg  40 mg Oral Daily [name], NP   40 mg at 04/08/23 0811   polyethylene glycol (MIRALAX / GLYCOLAX) packet 17 g  17 g Oral Daily PRN [name], NP   17 g at 04/03/23 1829   sertraline (ZOLOFT) tablet 150 mg  150 mg Oral Daily Massengill, Harrold Donath, MD   150 mg at 04/08/23 9604   SUMAtriptan (IMITREX) tablet 25 mg  25 mg Oral BID PRN [name], NP   25 mg at 04/04/23 1832    traZODone (DESYREL) tablet 50 mg  50 mg Oral QHS [name], NP   50 mg at 04/07/23 2106   Vitamin D (Ergocalciferol) (DRISDOL) 1.25 MG (50000 UNIT) capsule 50,000 Units  50,000 Units Oral Q7 days [name], NP   50,000 Units at 04/06/23 1725   Lab Results:  No results found for this or any previous visit (from the past 48 hour(s)).  Blood Alcohol level:  Lab Results  Component Value Date   ETH <10 12/19/2022   ETH <10 11/21/2019   Metabolic Disorder Labs: Lab Results  Component Value Date   HGBA1C 4.4 (L) 12/21/2022   MPG 79.58 12/21/2022   No results found for: "PROLACTIN" Lab Results  Component Value Date   CHOL 218 (H) 12/21/2022   TRIG 81 12/21/2022   HDL 53 12/21/2022   CHOLHDL 4.1 12/21/2022   VLDL 16 12/21/2022   LDLCALC 149 (H) 12/21/2022   LDLCALC 110 (H) 03/13/2011   Physical Findings: AIMS: Facial and Oral Movements Muscles of Facial Expression: None, normal Lips and Perioral Area: None, normal Jaw: None, normal Tongue: None, normal,Extremity Movements Upper (arms, wrists, hands, fingers): None, normal Lower (legs, knees, ankles, toes): None, normal, Trunk Movements Neck, shoulders, hips: None, normal, Overall Severity Severity of abnormal movements (highest score from questions above): None, normal Incapacitation due to abnormal movements: None, normal Patient's awareness of abnormal movements (rate only patient's report): No Awareness, Dental Status Current problems with teeth and/or dentures?: No Does patient usually wear dentures?: No  CIWA:    COWS:    AIMS:0 Musculoskeletal: Strength & Muscle Tone: within normal limits Gait & Station: normal Patient leans: N/A  Psychiatric Specialty Exam:  Presentation  General Appearance:  Disheveled  Eye Contact: Limited to baseline Speech: Decreased amount, decreased tone and volume but at baseline Speech Volume: Normal  Handedness: Right  Mood and Affect  Mood: Sad and depressed  mainly secondary to being in the hospital with no place to go Affect: Congruent  Thought Process  Thought Processes: Coherent  Descriptions of Associations:Intact  Orientation:Partial  Thought Content:Logical [place] History of Schizophrenia/Schizoaffective disorder:Yes  Duration of Psychotic Symptoms:Greater than six months  Hallucinations:Hallucinations: None    Ideas of Reference: None  Suicidal Thoughts:Suicidal Thoughts: No    Homicidal Thoughts:Homicidal Thoughts: No   Sensorium  Memory: Remote Poor; Recent Poor  Judgment: Poor  Insight: Poor  Executive Functions  Concentration: Fair  Attention Span: Fair  Recall: Poor  Fund of Knowledge: Poor  Language: Fair  Psychomotor Activity  Psychomotor Activity: Psychomotor Activity: Normal      Assets  Assets: Communication Skills  Sleep  Sleep: Sleep: Good   Physical Exam: Physical Exam Vitals and nursing note reviewed.  HENT:     Nose: Nose normal.     Mouth/Throat:     Pharynx: Oropharynx is clear.  Eyes:     Pupils: Pupils are equal, round, and reactive to light.  Cardiovascular:     Rate and Rhythm: Normal rate.     Pulses: Normal pulses.  Pulmonary:     Effort: Pulmonary effort is normal.  Genitourinary:    Comments: Deferred Musculoskeletal:        General: Normal range of motion.     Cervical back: Normal range of motion.  Skin:    General: Skin is warm and dry.  Neurological:     General: No focal deficit present.     Mental Status: She is alert and oriented to person, place, and time.  Psychiatric:        Mood and Affect: Mood normal.        Behavior: Behavior normal.        Thought Content: Thought content normal.    Review of Systems  Constitutional:  Negative for chills, diaphoresis and fever.  HENT:  Negative for congestion, hearing loss and sore throat.   Eyes:  Negative for blurred vision.  Respiratory:  Negative for cough, shortness of breath  and wheezing.   Cardiovascular:  Negative for chest pain and palpitations.  Gastrointestinal:  Negative for abdominal pain, constipation, diarrhea, heartburn, nausea and vomiting.  Genitourinary:  Negative for dysuria.  Musculoskeletal:  Negative for joint pain and myalgias.  Skin:  Negative for rash.  Neurological:  Negative for dizziness.  Psychiatric/Behavioral:  Positive for depression. Negative for hallucinations, memory loss, substance abuse and suicidal ideas. The patient is nervous/anxious and has insomnia.   All other systems reviewed and are negative.  Blood pressure 121/73, pulse 65, temperature (!) 97.5 F (36.4 C), temperature source Oral, resp. rate 14, height 4\' 11"  (1.499 m), weight 63 kg, SpO2 97%. Body mass index is 28.05 kg/m.  Treatment Plan Summary: Daily contact with patient to assess and evaluate symptoms and progress in treatment and Medication management.    Still waiting on safe disposition as patient would not be able to live independently given cognitive impairment. Recommend assisted living.    No medication side effects reported.  Because of urinary incontinence, recommended adult diapers to be worn.   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  Plan:  -Continue Imitrex 25 mg PRN BID for migraines -Continue Colace 100 mg daily for constipation -Continue Vitamin D 50.000 units weekly for bone health. -Continue Sertraline150 mg po  Q daily for depression/anxiety -Continue Buspar 15 mg po bid for anxiety.  -Continue paliperidone 6 mg po qd for mood stabilization -Continue Trazodone 50 mg nightly for insomnia  -Continue Nicoderm 21 mg topically Q 24 hrs for nicotine withdrawal management. -Continue vitamin B12 1000 mcg IM  weekly for 4 weeks then monthly -Continue Melatonin 5 mg nightly for sleep -Continue Protonix EC 40 mg p.o. daily for GERD -Continue MiraLAX 17 g p.o. PRN for constipation -Continue hydroxyzine 25 mg p.o. 3 times daily as needed for anxiety -Continue Ensure nutritional shakes TID in between meals  -Previously discontinued Hydroxyzine 50 mg nightly-hypotension in the mornings   Safety and Monitoring: Voluntary admission to inpatient psychiatric unit for safety, stabilization and treatment Daily contact with patient to assess and evaluate symptoms and progress in treatment Patient's case to be discussed in multi-disciplinary team meeting Observation Level : q15 minute checks Vital signs: q12 hours Precautions: Safety   Discharge Planning: Social work and case management to assist with discharge planning and identification of hospital follow-up needs prior to discharge Estimated LOS: Unknown at this time. Discharge Concerns: Need to establish a safety plan; Medication compliance and effectiveness Discharge Goals: Return home with outpatient referrals for mental health follow-up including medication management/psychotherapy No legal guardian, has payee  [name], NP,  04/08/2023, 3:08 PM Patient ID: Yolanda [name], female   DOB: 1975-11-05, 47 y.o.   MRN: [id number]

## 2023-04-09 DIAGNOSIS — F251 Schizoaffective disorder, depressive type: Secondary | ICD-10-CM | POA: Diagnosis not present

## 2023-04-09 LAB — URINALYSIS, ROUTINE W REFLEX MICROSCOPIC
Bacteria, UA: NONE SEEN
Bilirubin Urine: NEGATIVE
Glucose, UA: NEGATIVE mg/dL
Hgb urine dipstick: NEGATIVE
Ketones, ur: NEGATIVE mg/dL
Nitrite: NEGATIVE
Protein, ur: NEGATIVE mg/dL
Specific Gravity, Urine: 1.019 (ref 1.005–1.030)
pH: 5 (ref 5.0–8.0)

## 2023-04-09 NOTE — Progress Notes (Signed)
[facility] MD Progress Note  04/09/2023 1:11 PM Yolanda [name]  MRN:  [id number]  Principal Problem: Schizoaffective disorder, depressive type (HCC)  Diagnosis: Principal Problem:   Schizoaffective disorder, depressive type (HCC) Active Problems:   GERD (gastroesophageal reflux disease)   Intellectual disability   Tobacco use disorder  Reason for admission: This is the first psychiatric admission in this [facility] in 12 years for this 22 AA female with an extensive hx of mental illnesses & probable polysubstance use disorders. She is admitted to the [facility] from the [facility] hospital with complain of worsening suicidal ideations with plan to stab herself. Per chart review, patient apparently reported at the ED that she has been depressed for a while & has not been taking her mental health medications. After medical evaluation.clearance, she was transferred to the [facility] for further psychiatric evaluation/treatments.   24 hr chart review: Sleep Hours last night: No change continues to sleep fairly well. Slept for 6 hrs as per nursing documentation. Nursing Concerns: Continues to isolate in her room Behavioral episodes in the past 24 hrs: none reported or documented Medication Compliance: Compliant  Vital Signs in the past 24 hrs: WNL. PRN Medications in the past 24 WUJ:[id number] gum  Patient assessment note: Pt continues to present with a flat affect and depressed & dysphoric mood. Her attention to personal hygiene and grooming remains poor. Writer provided patient with positive verbal reinforcements to get out of bed and take care of her personal hygiene needs. She appears disheveled, looks visibly disheveled. Pt verbalizes lack of motivation to do so. Her eye contact is minimal today, speech is somewhat garbled with low pitch. Thought contents are organized and logical, and pt currently denies SI/HI/AVH or paranoia. There is no evidence of delusional thoughts.    Pt reports that sleep last night was  fair, she reports lack of interest in participating in activities in the day room, states that she has already taken part in all of our activities, and there is nothing that she still has to do here on the unit. She is seen today in her room lying in bed with eye closed, requiring positive reinforcements and verbal prompts through entire assessment to continue to answer questions for Clinical research associate. She reports a poor appetite, states that she would like to eat something else other than the hospital prepared meals. She was however, observed to be eating lunch today afternoon. She reports that her last bowel movement was last night. She denies being in any physical pain today.  We will continue to coordinate with DSS & CSW regarding getting patient out of the hospital setting into the community as soon as possible.  Symptoms are stable enough for management outpatient, but as per CSW, they have not been able to identify placement for her in the community.    We will continue medications as listed below, and we will continue to provide support for patient, and continue to positively reinforced for her to get out of her room and engage in unit activities. No TD/EPS type symptoms found on assessment, and pt denies any feelings of stiffness. AIMS: 0.   Total Time spent with patient:  45 minutes  Past Psychiatric History:  See H & P  Past Medical History:  Past Medical History:  Diagnosis Date   Bipolar affect, depressed (HCC)    Constipation 08/17/2022   Depression    Falls 07/21/2022   Fracture of femoral neck, right, closed (HCC) 01/17/2022   Herpes simplex 08/22/2017   Open  fracture dislocation of right elbow joint 01/17/2022    Past Surgical History:  Procedure Laterality Date   NO PAST SURGERIES     SALPINGECTOMY     Family History: History reviewed. No pertinent family history.  Family Psychiatric  History: See H & P  Social History:  Social History   Substance and Sexual Activity   Alcohol Use Yes     Social History   Substance and Sexual Activity  Drug Use Yes   Types: Cocaine, Marijuana    Social History   Socioeconomic History   Marital status: Single    Spouse name: Not on file   Number of children: Not on file   Years of education: Not on file   Highest education level: Not on file  Occupational History   Not on file  Tobacco Use   Smoking status: Every Day   Smokeless tobacco: Not on file  Substance and Sexual Activity   Alcohol use: Yes   Drug use: Yes    Types: Cocaine, Marijuana   Sexual activity: Yes  Other Topics Concern   Not on file  Social History Narrative   Not on file   Social Determinants of Health   Financial Resource Strain: Low Risk  (09/12/2022)   Received from [facility]   Overall Financial Resource Strain (CARDIA)    Difficulty of Paying Living Expenses: Not hard at all  Food Insecurity: Patient Declined (12/20/2022)   Hunger Vital Sign    Worried About Running Out of Food in the Last Year: Patient declined    Ran Out of Food in the Last Year: Patient declined  Transportation Needs: No Transportation Needs (12/20/2022)   PRAPARE - Administrator, Civil Service (Medical): No    Lack of Transportation (Non-Medical): No  Physical Activity: Not on file  Stress: No Stress Concern Present (07/17/2022)   Received from [facility] of Occupational Health - Occupational Stress Questionnaire    Feeling of Stress : Not at all  Social Connections: Unknown (07/16/2022)   Received from [facility]   Social Network    Social Network: Not on file   Sleep: Good  Appetite:  Good  Current Medications: Current Facility-Administered Medications  Medication Dose Route Frequency Provider Last Rate Last Admin   acetaminophen (TYLENOL) tablet 650 mg  650 mg Oral Q6H PRN [name], NP   650 mg at 04/05/23 2029   alum & mag hydroxide-simeth  (MAALOX/MYLANTA) 200-200-20 MG/5ML suspension 30 mL  30 mL Oral Q4H PRN [name], NP   30 mL at 01/28/23 1530   busPIRone (BUSPAR) tablet 15 mg  15 mg Oral BID [name], NP   15 mg at 04/09/23 0900   cyanocobalamin (VITAMIN B12) injection 1,000 mcg  1,000 mcg Intramuscular Q30 days [name], Nadir, MD   1,000 mcg at 04/03/23 1237   diphenhydrAMINE (BENADRYL) capsule 50 mg  50 mg Oral TID PRN [name], NP   50 mg at 01/15/23 1211   Or   diphenhydrAMINE (BENADRYL) injection 50 mg  50 mg Intramuscular TID PRN [name], NP       feeding supplement (ENSURE ENLIVE / ENSURE PLUS) liquid 237 mL  237 mL Oral TID BM Stepfanie Yott, NP   237 mL at 04/09/23 1019   haloperidol (HALDOL) tablet 5 mg  5 mg Oral TID PRN [name], NP   5 mg at 01/15/23 1211   Or  haloperidol lactate (HALDOL) injection 5 mg  5 mg Intramuscular TID PRN [name], NP       hydrOXYzine (ATARAX) tablet 25 mg  25 mg Oral TID PRN [name], DO   25 mg at 04/02/23 2041   LORazepam (ATIVAN) tablet 2 mg  2 mg Oral TID PRN [name], NP   2 mg at 01/15/23 1211   Or   LORazepam (ATIVAN) injection 2 mg  2 mg Intramuscular TID PRN [name], NP       magnesium hydroxide (MILK OF MAGNESIA) suspension 30 mL  30 mL Oral Daily PRN [name], NP   30 mL at 04/06/23 1824   melatonin tablet 5 mg  5 mg Oral QHS [name], NP   5 mg at 04/08/23 2117   nicotine (NICODERM CQ - dosed in mg/24 hours) patch 21 mg  21 mg Transdermal Daily [name], DO   21 mg at 03/25/23 4098   nicotine polacrilex (NICORETTE) gum 2 mg  2 mg Oral PRN [name], MD   2 mg at 04/09/23 1191   paliperidone (INVEGA) 24 hr tablet 6 mg  6 mg Oral Daily [name], NP   6 mg at 04/08/23 2117   pantoprazole (PROTONIX) EC tablet 40 mg  40 mg Oral Daily [name], NP   40 mg at 04/09/23 0900   polyethylene glycol (MIRALAX / GLYCOLAX) packet 17 g  17 g Oral Daily PRN [name], NP   17 g at 04/03/23 1829    sertraline (ZOLOFT) tablet 150 mg  150 mg Oral Daily Massengill, Harrold Donath, MD   150 mg at 04/09/23 0900   SUMAtriptan (IMITREX) tablet 25 mg  25 mg Oral BID PRN [name], NP   25 mg at 04/04/23 1832   traZODone (DESYREL) tablet 50 mg  50 mg Oral QHS [name], NP   50 mg at 04/08/23 2116   Vitamin D (Ergocalciferol) (DRISDOL) 1.25 MG (50000 UNIT) capsule 50,000 Units  50,000 Units Oral Q7 days [name], NP   50,000 Units at 04/06/23 1725   Lab Results:  Results for orders placed or performed during the hospital encounter of 12/20/22 (from the past 48 hour(s))  Urinalysis, Routine w reflex microscopic -Urine, Clean Catch     Status: Abnormal   Collection Time: 04/08/23  6:03 AM  Result Value Ref Range   Color, Urine YELLOW YELLOW   APPearance HAZY (A) CLEAR   Specific Gravity, Urine 1.019 1.005 - 1.030   pH 5.0 5.0 - 8.0   Glucose, UA NEGATIVE NEGATIVE mg/dL   Hgb urine dipstick NEGATIVE NEGATIVE   Bilirubin Urine NEGATIVE NEGATIVE   Ketones, ur NEGATIVE NEGATIVE mg/dL   Protein, ur NEGATIVE NEGATIVE mg/dL   Nitrite NEGATIVE NEGATIVE   Leukocytes,Ua TRACE (A) NEGATIVE   RBC / HPF 0-5 0 - 5 RBC/hpf   WBC, UA 0-5 0 - 5 WBC/hpf   Bacteria, UA NONE SEEN NONE SEEN   Squamous Epithelial / HPF 6-10 0 - 5 /HPF    Comment: Performed at [facility], [address], [place], [place] [address]    Blood Alcohol level:  Lab Results  Component Value Date   [facility] <10 12/19/2022   ETH <10 11/21/2019   Metabolic Disorder Labs: Lab Results  Component Value Date   HGBA1C 4.4 (L) 12/21/2022   MPG 79.58 12/21/2022   No results found for: "PROLACTIN" Lab Results  Component Value Date   CHOL 218 (H) 12/21/2022  TRIG 81 12/21/2022   HDL 53 12/21/2022   CHOLHDL 4.1 12/21/2022   VLDL 16 12/21/2022   LDLCALC 149 (H) 12/21/2022   LDLCALC 110 (H) 03/13/2011   Physical Findings: AIMS: Facial and Oral Movements Muscles of Facial Expression: None, normal Lips and  Perioral Area: None, normal Jaw: None, normal Tongue: None, normal,Extremity Movements Upper (arms, wrists, hands, fingers): None, normal Lower (legs, knees, ankles, toes): None, normal, Trunk Movements Neck, shoulders, hips: None, normal, Overall Severity Severity of abnormal movements (highest score from questions above): None, normal Incapacitation due to abnormal movements: None, normal Patient's awareness of abnormal movements (rate only patient's report): No Awareness, Dental Status Current problems with teeth and/or dentures?: No Does patient usually wear dentures?: No  CIWA:    COWS:    AIMS:0 Musculoskeletal: Strength & Muscle Tone: within normal limits Gait & Station: normal Patient leans: N/A  Psychiatric Specialty Exam:  Presentation  General Appearance:  Disheveled  Eye Contact: Limited to baseline Speech: Decreased amount, decreased tone and volume but at baseline Speech Volume: Decreased  Handedness: Right  Mood and Affect  Mood: Sad and depressed mainly secondary to being in the hospital with no place to go Affect: Congruent  Thought Process  Thought Processes: Coherent  Descriptions of Associations:Intact  Orientation:Full (Time, Place and Person)  Thought Content:Logical [place]crete History of Schizophrenia/Schizoaffective disorder:Yes  Duration of Psychotic Symptoms:Greater than six months  Hallucinations:Hallucinations: None    Ideas of Reference: None  Suicidal Thoughts:Suicidal Thoughts: No    Homicidal Thoughts:Homicidal Thoughts: No   Sensorium  Memory: Immediate Good  Judgment: Fair  Insight: Fair  Art therapist  Concentration: Poor  Attention Span: Poor  Recall: Poor  Fund of Knowledge: Poor  Language: Fair  Psychomotor Activity  Psychomotor Activity: Psychomotor Activity: Normal      Assets  Assets: Resilience  Sleep  Sleep: Sleep: Fair   Physical Exam: Physical Exam Vitals  and nursing note reviewed.  HENT:     Nose: Nose normal.     Mouth/Throat:     Pharynx: Oropharynx is clear.  Eyes:     Pupils: Pupils are equal, round, and reactive to light.  Cardiovascular:     Rate and Rhythm: Normal rate.     Pulses: Normal pulses.  Pulmonary:     Effort: Pulmonary effort is normal.  Genitourinary:    Comments: Deferred Musculoskeletal:        General: Normal range of motion.     Cervical back: Normal range of motion.  Skin:    General: Skin is warm and dry.  Neurological:     General: No focal deficit present.     Mental Status: She is alert and oriented to person, place, and time.  Psychiatric:        Mood and Affect: Mood normal.        Behavior: Behavior normal.        Thought Content: Thought content normal.    Review of Systems  Constitutional:  Negative for chills, diaphoresis and fever.  HENT:  Negative for congestion, hearing loss and sore throat.   Eyes:  Negative for blurred vision.  Respiratory:  Negative for cough, shortness of breath and wheezing.   Cardiovascular:  Negative for chest pain and palpitations.  Gastrointestinal:  Negative for abdominal pain, constipation, diarrhea, heartburn, nausea and vomiting.  Genitourinary:  Negative for dysuria.  Musculoskeletal:  Negative for joint pain and myalgias.  Skin:  Negative for rash.  Neurological:  Negative for dizziness.  Psychiatric/Behavioral:  Positive for depression. Negative for hallucinations, memory loss, substance abuse and suicidal ideas. The patient is nervous/anxious and has insomnia.   All other systems reviewed and are negative.  Blood pressure 97/76, pulse 90, temperature 98.5 F (36.9 C), temperature source Oral, resp. rate 16, height 4\' 11"  (1.499 m), weight 63 kg, SpO2 97%. Body mass index is 28.05 kg/m.  Treatment Plan Summary: Daily contact with patient to assess and evaluate symptoms and progress in treatment and Medication management.    Still waiting on safe  disposition as patient would not be able to live independently given cognitive impairment. Recommend assisted living.    No medication side effects reported.  Because of urinary incontinence, recommended adult diapers to be worn.   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  Plan:  -Continue Imitrex 25 mg PRN BID for migraines -Continue Colace 100 mg daily for constipation -Continue Vitamin D 50.000 units weekly for bone health. -Continue Sertraline150 mg po Q daily for depression/anxiety -Continue Buspar 15 mg po bid for anxiety.  -Continue paliperidone 6 mg po qd for mood stabilization -Continue Trazodone 50 mg nightly for insomnia  -Continue Nicoderm 21 mg topically Q 24 hrs for nicotine withdrawal management. -Continue vitamin B12 1000 mcg IM weekly for 4 weeks then monthly -Continue Melatonin 5 mg nightly for sleep -Continue Protonix EC 40 mg p.o. daily for GERD -Continue MiraLAX 17 g p.o. PRN for constipation -Continue hydroxyzine 25 mg p.o. 3 times daily as needed for anxiety -Continue Ensure nutritional shakes TID in between meals  -Previously discontinued Hydroxyzine 50 mg nightly-hypotension in the mornings   Safety and Monitoring: Voluntary admission to inpatient psychiatric unit for safety, stabilization and treatment Daily contact with patient to assess and evaluate symptoms and progress in treatment Patient's case to be discussed in multi-disciplinary team meeting Observation Level : q15 minute checks Vital signs: q12 hours Precautions: Safety   Discharge Planning: Social work and case management to assist with discharge planning and identification of hospital follow-up needs prior to discharge Estimated LOS: Unknown at this  time. Discharge Concerns: Need to establish a safety plan; Medication compliance and effectiveness Discharge Goals: Return home with outpatient referrals for mental health follow-up including medication management/psychotherapy No legal guardian, has payee  [name], NP,  04/09/2023, 1:11 PM Patient ID: Yolanda [name], female   DOB: 1975/10/25, 47 y.o.   MRN: [id number] Patient ID: Yolanda [name], female   DOB: 01/09/1976, 47 y.o.   MRN: [id number]

## 2023-04-09 NOTE — B[facility] Group Notes (Signed)
Adult Psychoeducational Group Note  Date:  04/09/2023 Time:  8:52 PM  Group Topic/Focus:  Wrap-Up Group:   The focus of this group is to help patients review their daily goal of treatment and discuss progress on daily workbooks.  Participation Level:  Did Not Attend  Yolanda [name] 04/09/2023, 8:52 PM

## 2023-04-09 NOTE — Progress Notes (Signed)
D:  Patient's self inventory sheet, patient sleeps good, no sleep medication.  Good appetite, low energy level, good concentration.  Rated depression, hopeless and anxiety 5.  Denied withdrawals.  Denied SI.  Denied physical problems.  Goal is attend groups.  Plans to interact with peers and groups.  No discharge plans.  Working on discharge plans. A:  Medications administered per MD orders.  Emotional support and encouragement given patient. R:  Denied SI and HI, contracts for safety.  Denied A/V hallucinations.  Safety maintained with 15 minute checks.

## 2023-04-09 NOTE — Group Note (Signed)
LCSW Group Therapy Note   Group Date: 04/09/2023 Start Time: 1100 End Time: 1200   Type of Therapy and Topic:  Group Therapy - Coping Skills For Anxiety and Depression  Participation Level:  Did Not Attend   Description of Group The focus of this group was to determine what healthy coping techniques would be helpful for group members in coping with anxiety and depression in their daily lives. The group began with patients introducing themselves and revealing one healthy and one unhealthy way they have coped with anxiety or depression in the past. Patients were guided through different techniques for coping with anxiety in a healthy way, including deep breathing, progressive muscle relaxation, challenging irrational thoughts, and mental imagery. Patients were then guided though different techniques for coping with depression in a healthy way, including behavioral activation, increasing social supports, focusing on positive experiences, and mindfulness. Differences between healthy and unhealthy coping techniques were pointed out when brought up by group members. Patients were asked to identify 2-3 healthy coping skills they would like to learn to use more effectively after being guided through these different techniques.These were explained, samples demonstrated, and resources shared for how to learn more at discharge.  Therapeutic Goals Patients learned that coping is what human beings do to deal with various situations in their lives. Patients learned various healthy coping techniques for anxiety and depression Patients determined 2-3 healthy coping skills they would like to become more familiar with and use more often. Patients provided support and ideas to each other.   Summary of Patient Progress:    Did not attend Group   Therapeutic Modalities Cognitive Behavioral Therapy Motivational Interviewing Dialectical Behavioral Therapy  Izell Egan, LCSW 04/09/2023  1:23 PM

## 2023-04-09 NOTE — Group Note (Signed)
Recreation Therapy Group Note   Group Topic:Animal Assisted Therapy   Group Date: 04/09/2023 Start Time: 0946 End Time: 1030 Facilitators: Westin Knotts-McCall, LRT,CTRS Location: [address] Dayroom   Animal-Assisted Activity (AAA) Program Checklist/Progress Notes Patient Eligibility Criteria Checklist & Daily Group note for Rec Tx Intervention  AAA/T Program Assumption of Risk Form signed by Patient/ or Parent Legal Guardian Yes  Patient understands his/her participation is voluntary Yes  Education: Charity fundraiser, Appropriate Animal Interaction    Education Outcome: Acknowledges education.    Affect/Mood: N/A   Participation Level: Did not attend    Clinical Observations/Individualized Feedback:     Plan: Continue to engage patient in RT group sessions 2-3x/week.   Yolanda [name], LRT,CTRS  04/09/2023 1:07 PM

## 2023-04-09 NOTE — Plan of Care (Signed)
Nurse discussed anxiety, depression and coping skills with patient.  

## 2023-04-09 NOTE — Group Note (Unsigned)
Date:  04/09/2023 Time:  5:19 PM  Group Topic/Focus:  Goals Group:   The focus of this group is to help patients establish daily goals to achieve during treatment and discuss how the patient can incorporate goal setting into their daily lives to aide in recovery. Orientation:   The focus of this group is to educate the patient on the purpose and policies of crisis stabilization and provide a format to answer questions about their admission.  The group details unit policies and expectations of patients while admitted.     Participation Level:  {BHH PARTICIPATION UEAVW:09811}  Participation Quality:  {BHH PARTICIPATION QUALITY:22265}  Affect:  {BHH AFFECT:22266}  Cognitive:  {BHH COGNITIVE:22267}  Insight: {BHH Insight2:20797}  Engagement in Group:  {BHH ENGAGEMENT IN BJYNW:29562}  Modes of Intervention:  {BHH MODES OF INTERVENTION:22269}  Additional Comments:  ***  Raylyn Carton M Anagha Loseke 04/09/2023, 5:19 PM

## 2023-04-09 NOTE — Progress Notes (Signed)
Nurse discussed anxiety, depression and coping skills with patient.  

## 2023-04-09 NOTE — Group Note (Signed)
Date:  04/09/2023 Time:  2:03 PM  Group Topic/Focus:  Goals Group:   The focus of this group is to help patients establish daily goals to achieve during treatment and discuss how the patient can incorporate goal setting into their daily lives to aide in recovery. Orientation:   The focus of this group is to educate the patient on the purpose and policies of crisis stabilization and provide a format to answer questions about their admission.  The group details unit policies and expectations of patients while admitted.    Participation Level:  Did Not Attend  Participation Quality:   n/a  Affect:   n/a  Cognitive:   n/a  Insight: None  Engagement in Group:   n/a  Modes of Intervention:   n/a  Additional Comments:   Pt did not attend.  Yolanda [name] 04/09/2023, 2:03 PM

## 2023-04-09 NOTE — Progress Notes (Signed)
   04/08/23 2347  Psych Admission Type (Psych Patients Only)  Admission Status Involuntary  Psychosocial Assessment  Patient Complaints Depression  Eye Contact Fair  Facial Expression Animated  Affect Depressed  Speech Logical/coherent  Interaction Childlike  Motor Activity Slow;Tremors  Appearance/Hygiene Disheveled  Behavior Characteristics Appropriate to situation  Mood Depressed;Pleasant  Thought Process  Coherency WDL  Content WDL  Delusions None reported or observed  Perception WDL  Hallucination None reported or observed  Judgment Limited  Confusion None  Danger to Self  Current suicidal ideation? Denies  Self-Injurious Behavior No self-injurious ideation or behavior indicators observed or expressed   Agreement Not to Harm Self Yes  Description of Agreement verbal  Danger to Others  Danger to Others None reported or observed

## 2023-04-10 ENCOUNTER — Encounter (HOSPITAL_COMMUNITY): Payer: Self-pay

## 2023-04-10 DIAGNOSIS — F251 Schizoaffective disorder, depressive type: Secondary | ICD-10-CM | POA: Diagnosis not present

## 2023-04-10 NOTE — BH IP Treatment Plan (Signed)
Interdisciplinary Treatment and Diagnostic Plan Update  04/10/2023 Time of Session: 100 Yolanda [name] MRN: [id number]  Principal Diagnosis: Schizoaffective disorder, depressive type (HCC)  Secondary Diagnoses: Principal Problem:   Schizoaffective disorder, depressive type (HCC) Active Problems:   GERD (gastroesophageal reflux disease)   Intellectual disability   Tobacco use disorder   Current Medications:  Current Facility-Administered Medications  Medication Dose Route Frequency Provider Last Rate Last Admin   acetaminophen (TYLENOL) tablet 650 mg  650 mg Oral Q6H PRN [name], NP   650 mg at 04/05/23 2029   alum & mag hydroxide-simeth (MAALOX/MYLANTA) 200-200-20 MG/5ML suspension 30 mL  30 mL Oral Q4H PRN [name], NP   30 mL at 01/28/23 1530   busPIRone (BUSPAR) tablet 15 mg  15 mg Oral BID [name], NP   15 mg at 04/10/23 1013   cyanocobalamin (VITAMIN B12) injection 1,000 mcg  1,000 mcg Intramuscular Q30 days [name], Nadir, MD   1,000 mcg at 04/03/23 1237   diphenhydrAMINE (BENADRYL) capsule 50 mg  50 mg Oral TID PRN [name], NP   50 mg at 01/15/23 1211   Or   diphenhydrAMINE (BENADRYL) injection 50 mg  50 mg Intramuscular TID PRN [name], NP       feeding supplement (ENSURE ENLIVE / ENSURE PLUS) liquid 237 mL  237 mL Oral TID BM Nkwenti, Doris, NP   237 mL at 04/10/23 1400   haloperidol (HALDOL) tablet 5 mg  5 mg Oral TID PRN [name], NP   5 mg at 01/15/23 1211   Or   haloperidol lactate (HALDOL) injection 5 mg  5 mg Intramuscular TID PRN [name], NP       hydrOXYzine (ATARAX) tablet 25 mg  25 mg Oral TID PRN [name], DO   25 mg at 04/02/23 2041   LORazepam (ATIVAN) tablet 2 mg  2 mg Oral TID PRN [name], NP   2 mg at 01/15/23 1211   Or   LORazepam (ATIVAN) injection 2 mg  2 mg Intramuscular TID PRN [name], NP       magnesium hydroxide (MILK OF MAGNESIA) suspension 30 mL  30 mL Oral Daily PRN [name], NP    30 mL at 04/06/23 1824   melatonin tablet 5 mg  5 mg Oral QHS Nkwenti, Doris, NP   5 mg at 04/09/23 2058   nicotine (NICODERM CQ - dosed in mg/24 hours) patch 21 mg  21 mg Transdermal Daily [name], DO   21 mg at 03/25/23 4098   nicotine polacrilex (NICORETTE) gum 2 mg  2 mg Oral PRN [name], MD   2 mg at 04/10/23 1407   paliperidone (INVEGA) 24 hr tablet 6 mg  6 mg Oral Daily Nkwenti, Tyler Aas, NP   6 mg at 04/09/23 2059   pantoprazole (PROTONIX) EC tablet 40 mg  40 mg Oral Daily Nwoko, Nicole Kindred I, NP   40 mg at 04/10/23 1013   polyethylene glycol (MIRALAX / GLYCOLAX) packet 17 g  17 g Oral Daily PRN [name], NP   17 g at 04/03/23 1829   sertraline (ZOLOFT) tablet 150 mg  150 mg Oral Daily Massengill, Harrold Donath, MD   150 mg at 04/10/23 1013   SUMAtriptan (IMITREX) tablet 25 mg  25 mg Oral BID PRN [name], NP   25 mg at 04/04/23 1832   traZODone (DESYREL) tablet 50 mg  50 mg Oral QHS [name], NP   50 mg at  04/09/23 2058   Vitamin D (Ergocalciferol) (DRISDOL) 1.25 MG (50000 UNIT) capsule 50,000 Units  50,000 Units Oral Q7 days [name], NP   50,000 Units at 04/06/23 1725   PTA Medications: Medications Prior to Admission  Medication Sig Dispense Refill Last Dose   busPIRone (BUSPAR) 15 MG tablet Take 15 mg by mouth 2 (two) times daily. (Patient not taking: Reported on 12/19/2022)      paliperidone (INVEGA SUSTENNA) 156 MG/ML SUSY injection Inject 156 mg into the muscle once. (Patient not taking: Reported on 12/19/2022)      sertraline (ZOLOFT) 50 MG tablet Take 150 mg by mouth daily. (Patient not taking: Reported on 12/19/2022)      traZODone (DESYREL) 100 MG tablet Take 100 mg by mouth at bedtime as needed for sleep. (Patient not taking: Reported on 11/12/2022)       Patient Stressors: Medication change or noncompliance    Patient Strengths: Forensic psychologist fund of knowledge   Treatment Modalities: Medication Management, Group therapy, Case  management,  1 to 1 session with clinician, Psychoeducation, Recreational therapy.   Physician Treatment Plan for Primary Diagnosis: Schizoaffective disorder, depressive type (HCC) Long Term Goal(s): Improvement in symptoms so as ready for discharge   Short Term Goals: Ability to identify and develop effective coping behaviors will improve Ability to maintain clinical measurements within normal limits will improve Compliance with prescribed medications will improve Ability to identify triggers associated with substance abuse/mental health issues will improve Ability to identify changes in lifestyle to reduce recurrence of condition will improve Ability to verbalize feelings will improve Ability to disclose and discuss suicidal ideas Ability to demonstrate self-control will improve  Medication Management: Evaluate patient's response, side effects, and tolerance of medication regimen.  Therapeutic Interventions: 1 to 1 sessions, Unit Group sessions and Medication administration.  Evaluation of Outcomes: Progressing  Physician Treatment Plan for Secondary Diagnosis: Principal Problem:   Schizoaffective disorder, depressive type (HCC) Active Problems:   GERD (gastroesophageal reflux disease)   Intellectual disability   Tobacco use disorder  Long Term Goal(s): Improvement in symptoms so as ready for discharge   Short Term Goals: Ability to identify and develop effective coping behaviors will improve Ability to maintain clinical measurements within normal limits will improve Compliance with prescribed medications will improve Ability to identify triggers associated with substance abuse/mental health issues will improve Ability to identify changes in lifestyle to reduce recurrence of condition will improve Ability to verbalize feelings will improve Ability to disclose and discuss suicidal ideas Ability to demonstrate self-control will improve     Medication Management: Evaluate  patient's response, side effects, and tolerance of medication regimen.  Therapeutic Interventions: 1 to 1 sessions, Unit Group sessions and Medication administration.  Evaluation of Outcomes: Progressing   RN Treatment Plan for Primary Diagnosis: Schizoaffective disorder, depressive type (HCC) Long Term Goal(s): Knowledge of disease and therapeutic regimen to maintain health will improve  Short Term Goals: Ability to remain free from injury will improve, Ability to verbalize frustration and anger appropriately will improve, Ability to demonstrate self-control, Ability to participate in decision making will improve, Ability to verbalize feelings will improve, Ability to disclose and discuss suicidal ideas, Ability to identify and develop effective coping behaviors will improve, and Compliance with prescribed medications will improve  Medication Management: RN will administer medications as ordered by provider, will assess and evaluate patient's response and provide education to patient for prescribed medication. RN will report any adverse and/or side effects to prescribing provider.  Therapeutic  Interventions: 1 on 1 counseling sessions, Psychoeducation, Medication administration, Evaluate responses to treatment, Monitor vital signs and CBGs as ordered, Perform/monitor CIWA, COWS, AIMS and Fall Risk screenings as ordered, Perform wound care treatments as ordered.  Evaluation of Outcomes: Progressing   LCSW Treatment Plan for Primary Diagnosis: Schizoaffective disorder, depressive type (HCC) Long Term Goal(s): Safe transition to appropriate next level of care at discharge, Engage patient in therapeutic group addressing interpersonal concerns.  Short Term Goals: Engage patient in aftercare planning with referrals and resources, Increase social support, Increase ability to appropriately verbalize feelings, Increase emotional regulation, Facilitate acceptance of mental health diagnosis and concerns,  Facilitate patient progression through stages of change regarding substance use diagnoses and concerns, Identify triggers associated with mental health/substance abuse issues, and Increase skills for wellness and recovery  Therapeutic Interventions: Assess for all discharge needs, 1 to 1 time with Social worker, Explore available resources and support systems, Assess for adequacy in community support network, Educate family and significant other(s) on suicide prevention, Complete Psychosocial Assessment, Interpersonal group therapy.  Evaluation of Outcomes: Progressing   Progress in Treatment: Attending groups: Yes. Participating in groups: Yes. Taking medication as prescribed: Yes. Toleration medication: Yes. Family/Significant other contact made: Yes, individual(s) contacted:  Tamera Reason Patient understands diagnosis: Yes. Discussing patient identified problems/goals with staff: Yes. Medical problems stabilized or resolved: Yes. Denies suicidal/homicidal ideation: Yes. Issues/concerns per patient self-inventory: Yes. Other: N/A  New problem(s) identified: No, Describe:  None Reported  New Short Term/Long Term Goal(s):stabilization, elimination of SI thoughts, development of comprehensive mental wellness plan.  medication   Patient Goals:  Coping Skills/Housing  Discharge Plan or Barriers: CSW will continue to follow and assess for appropriate referrals and possible discharge planning.   Reason for Continuation of Hospitalization: Depression Medication stabilization Suicidal ideation  Estimated Length of Stay: 5-7 Days  Last 3 [place] Suicide Severity Risk Score: Flowsheet Row Admission (Current) [facility] from 12/19/2022 in [facility] Department [facility] ED from 11/12/2022 in [facility] Emergency Department at [facility]  C-SSRS RISK CATEGORY High Risk High Risk Moderate Risk        Last [facility] 2/9 Scores:     No data to display         medication stabilization, elimination of SI thoughts, development of comprehensive mental wellness plan.    Scribe for Treatment Team: Ane Payment, LCSW 04/10/2023 2:43 PM

## 2023-04-10 NOTE — Progress Notes (Signed)
[facility] MD Progress Note  04/10/2023 4:44 PM Jlyn IRENA GAYDOS  MRN:  [id number]  Principal Problem: Schizoaffective disorder, depressive type (HCC)  Diagnosis: Principal Problem:   Schizoaffective disorder, depressive type (HCC) Active Problems:   GERD (gastroesophageal reflux disease)   Intellectual disability   Tobacco use disorder  Reason for admission: This is the first psychiatric admission in this [facility] in 12 years for this 13 AA female with an extensive hx of mental illnesses & probable polysubstance use disorders. She is admitted to the [facility] from the [facility] hospital with complain of worsening suicidal ideations with plan to stab herself. Per chart review, patient apparently reported at the ED that she has been depressed for a while & has not been taking her mental health medications. After medical evaluation.clearance, she was transferred to the [facility] for further psychiatric evaluation/treatments.   24 hr chart review: Sleep Hours last night: No change continues to sleep fairly well.  Nursing Concerns: Continues to isolate in her room Behavioral episodes in the past 24 hrs: none reported or documented Medication Compliance: Compliant  Vital Signs in the past 24 hrs: WNL. PRN Medications in the past 24 NWG:NFAOZHYQM gum  Patient assessment note: During encounter today, patient is observed to be laying in bed in the fetal position, eyes are closed, she refuses to engage in assessment, refuses to open her eyes, and talks to Clinical research associate with eyes closed.  She denies suicidal ideations, but verbalizes feeling very depressed secondary to still being hospitalized, states that she is tired of being in the hospital, and wants to be discharged.  Writer educated patient on the importance of a safe place in the community being found for her prior to discharge, but she verbalizes being tired of waiting.  She verbalizes her frustration at the process, which seems to be moving very slowly.  She reports that  continuous hospitalization is rendering her more depressed than feeling better.  Empathy and active listening provided.  Patient educated that CSW is continuing to coordinate with DSS, to locate a place for her in the community.  She denies SI/HI/AVH, denies paranoia, and symptoms are currently stable, and have been stable for several weeks now and now for management outside of an acute care psychiatric hospital.    She denies medication related side effects, verbalizes lack of motivation attending to personal hygiene needs, and is remaining isolated in bed, due to thinking that she will never be discharged from the hospital as per her reports.  She reports that she is eating, but the food here is no longer appealing to her, states she would like food outside of this hospital.  She denies being in any physical pain today, denies any other concerns other than wanting to be discharged.  As per CSW's reports, there was a meeting with DSS last week, and there was no progress in locating safe housing in the community for patient.  Positive verbal reinforcements will continue to be given to patient to get out of her room and engage in unit activities while awaiting a safe discharge disposition in the community.  We will continue medications as listed below, and we will continue to provide support for patient, and continue to positively reinforced for her to get out of her room and engage in unit activities. No TD/EPS type symptoms found on assessment, and pt denies any feelings of stiffness. AIMS: 0.   Total Time spent with patient:  45 minutes  Past Psychiatric History:  See H & P  Past Medical History:  Past Medical History:  Diagnosis Date   Bipolar affect, depressed (HCC)    Constipation 08/17/2022   Depression    Falls 07/21/2022   Fracture of femoral neck, right, closed (HCC) 01/17/2022   Herpes simplex 08/22/2017   Open fracture dislocation of right elbow joint 01/17/2022    Past Surgical  History:  Procedure Laterality Date   NO PAST SURGERIES     SALPINGECTOMY     Family History: History reviewed. No pertinent family history.  Family Psychiatric  History: See H & P  Social History:  Social History   Substance and Sexual Activity  Alcohol Use Yes     Social History   Substance and Sexual Activity  Drug Use Yes   Types: Cocaine, Marijuana    Social History   Socioeconomic History   Marital status: Single    Spouse name: Not on file   Number of children: Not on file   Years of education: Not on file   Highest education level: Not on file  Occupational History   Not on file  Tobacco Use   Smoking status: Every Day   Smokeless tobacco: Not on file  Substance and Sexual Activity   Alcohol use: Yes   Drug use: Yes    Types: Cocaine, Marijuana   Sexual activity: Yes  Other Topics Concern   Not on file  Social History Narrative   Not on file   Social Determinants of Health   Financial Resource Strain: Low Risk  (09/12/2022)   Received from [facility], [facility]   Overall Financial Resource Strain (CARDIA)    Difficulty of Paying Living Expenses: Not hard at all  Food Insecurity: Patient Declined (12/20/2022)   Hunger Vital Sign    Worried About Running Out of Food in the Last Year: Patient declined    Ran Out of Food in the Last Year: Patient declined  Transportation Needs: No Transportation Needs (12/20/2022)   PRAPARE - Administrator, Civil Service (Medical): No    Lack of Transportation (Non-Medical): No  Physical Activity: Not on file  Stress: No Stress Concern Present (07/17/2022)   Received from [facility], [facility] - Occupational Stress Questionnaire    Feeling of Stress : Not at all  Social Connections: Unknown (07/16/2022)   Received from [facility], [facility]   Social Network    Social Network: Not on file   Sleep: Good  Appetite:  Good  Current  Medications: Current Facility-Administered Medications  Medication Dose Route Frequency Provider Last Rate Last Admin   acetaminophen (TYLENOL) tablet 650 mg  650 mg Oral Q6H PRN [name], NP   650 mg at 04/05/23 2029   alum & mag hydroxide-simeth (MAALOX/MYLANTA) 200-200-20 MG/5ML suspension 30 mL  30 mL Oral Q4H PRN [name], NP   30 mL at 01/28/23 1530   busPIRone (BUSPAR) tablet 15 mg  15 mg Oral BID [name], NP   15 mg at 04/10/23 1013   cyanocobalamin (VITAMIN B12) injection 1,000 mcg  1,000 mcg Intramuscular Q30 days Sarita Bottom, MD   1,000 mcg at 04/03/23 1237   diphenhydrAMINE (BENADRYL) capsule 50 mg  50 mg Oral TID PRN [name], NP   50 mg at 01/15/23 1211   Or   diphenhydrAMINE (BENADRYL) injection 50 mg  50 mg Intramuscular TID PRN [name], NP       feeding supplement (ENSURE ENLIVE / ENSURE  PLUS) liquid 237 mL  237 mL Oral TID BM Hunt Zajicek, NP   237 mL at 04/10/23 1400   haloperidol (HALDOL) tablet 5 mg  5 mg Oral TID PRN [name], NP   5 mg at 01/15/23 1211   Or   haloperidol lactate (HALDOL) injection 5 mg  5 mg Intramuscular TID PRN [name], NP       hydrOXYzine (ATARAX) tablet 25 mg  25 mg Oral TID PRN [name], DO   25 mg at 04/02/23 2041   LORazepam (ATIVAN) tablet 2 mg  2 mg Oral TID PRN [name], NP   2 mg at 01/15/23 1211   Or   LORazepam (ATIVAN) injection 2 mg  2 mg Intramuscular TID PRN [name], NP       magnesium hydroxide (MILK OF MAGNESIA) suspension 30 mL  30 mL Oral Daily PRN [name], NP   30 mL at 04/06/23 1824   melatonin tablet 5 mg  5 mg Oral QHS Mackinley Cassaday, NP   5 mg at 04/09/23 2058   nicotine (NICODERM CQ - dosed in mg/24 hours) patch 21 mg  21 mg Transdermal Daily [name], DO   21 mg at 03/25/23 8119   nicotine polacrilex (NICORETTE) gum 2 mg  2 mg Oral PRN [name], MD   2 mg at 04/10/23 1407   paliperidone (INVEGA) 24 hr tablet 6 mg  6 mg Oral Daily [name], NP    6 mg at 04/09/23 2059   pantoprazole (PROTONIX) EC tablet 40 mg  40 mg Oral Daily Nwoko, Nicole Kindred I, NP   40 mg at 04/10/23 1013   polyethylene glycol (MIRALAX / GLYCOLAX) packet 17 g  17 g Oral Daily PRN [name], NP   17 g at 04/03/23 1829   sertraline (ZOLOFT) tablet 150 mg  150 mg Oral Daily Massengill, Harrold Donath, MD   150 mg at 04/10/23 1013   SUMAtriptan (IMITREX) tablet 25 mg  25 mg Oral BID PRN [name], NP   25 mg at 04/04/23 1832   traZODone (DESYREL) tablet 50 mg  50 mg Oral QHS [name], NP   50 mg at 04/09/23 2058   Vitamin D (Ergocalciferol) (DRISDOL) 1.25 MG (50000 UNIT) capsule 50,000 Units  50,000 Units Oral Q7 days [name], NP   50,000 Units at 04/06/23 1725   Lab Results:  No results found for this or any previous visit (from the past 48 hour(s)).   Blood Alcohol level:  Lab Results  Component Value Date   ETH <10 12/19/2022   ETH <10 11/21/2019   Metabolic Disorder Labs: Lab Results  Component Value Date   HGBA1C 4.4 (L) 12/21/2022   MPG 79.58 12/21/2022   No results found for: "PROLACTIN" Lab Results  Component Value Date   CHOL 218 (H) 12/21/2022   TRIG 81 12/21/2022   HDL 53 12/21/2022   CHOLHDL 4.1 12/21/2022   VLDL 16 12/21/2022   LDLCALC 149 (H) 12/21/2022   LDLCALC 110 (H) 03/13/2011   Physical Findings: AIMS: Facial and Oral Movements Muscles of Facial Expression: None, normal Lips and Perioral Area: None, normal Jaw: None, normal Tongue: None, normal,Extremity Movements Upper (arms, wrists, hands, fingers): None, normal Lower (legs, knees, ankles, toes): None, normal, Trunk Movements Neck, shoulders, hips: None, normal, Overall Severity Severity of abnormal movements (highest score from questions above): None, normal Incapacitation due to abnormal movements: None, normal Patient's awareness of abnormal movements (rate only patient's report): No  Awareness, Dental Status Current problems with teeth and/or dentures?:  No Does patient usually wear dentures?: No  CIWA:    COWS:    AIMS:0 Musculoskeletal: Strength & Muscle Tone: within normal limits Gait & Station: normal Patient leans: N/A  Psychiatric Specialty Exam:  Presentation  General Appearance:  Disheveled  Eye Contact: Limited to baseline Speech: Decreased amount, decreased tone and volume but at baseline Speech Volume: Decreased  Handedness: Right  Mood and Affect  Mood: Sad and depressed mainly secondary to being in the hospital with no place to go Affect: Congruent  Thought Process  Thought Processes: Coherent  Descriptions of Associations:Intact  Orientation:Partial  Thought Content:Logical [place] History of Schizophrenia/Schizoaffective disorder:Yes  Duration of Psychotic Symptoms:Greater than six months  Hallucinations:Hallucinations: None   Ideas of Reference: None  Suicidal Thoughts:Suicidal Thoughts: No   Homicidal Thoughts:Homicidal Thoughts: No  Sensorium  Memory: Recent Poor  Judgment: Fair  Insight: Fair  Art therapist  Concentration: Poor  Attention Span: Poor  Recall: Poor  Fund of Knowledge: Poor  Language: Fair  Psychomotor Activity  Psychomotor Activity: Psychomotor Activity: Normal   Assets  Assets: Resilience  Sleep  Sleep: Sleep: Good   Physical Exam: Physical Exam Vitals and nursing note reviewed.  HENT:     Nose: Nose normal.     Mouth/Throat:     Pharynx: Oropharynx is clear.  Eyes:     Pupils: Pupils are equal, round, and reactive to light.  Cardiovascular:     Rate and Rhythm: Normal rate.     Pulses: Normal pulses.  Pulmonary:     Effort: Pulmonary effort is normal.  Genitourinary:    Comments: Deferred Musculoskeletal:        General: Normal range of motion.     Cervical back: Normal range of motion.  Skin:    General: Skin is warm and dry.  Neurological:     General: No focal deficit present.     Mental Status: She is  alert and oriented to person, place, and time.  Psychiatric:        Mood and Affect: Mood normal.        Behavior: Behavior normal.        Thought Content: Thought content normal.    Review of Systems  Constitutional:  Negative for chills, diaphoresis and fever.  HENT:  Negative for congestion, hearing loss and sore throat.   Eyes:  Negative for blurred vision.  Respiratory:  Negative for cough, shortness of breath and wheezing.   Cardiovascular:  Negative for chest pain and palpitations.  Gastrointestinal:  Negative for abdominal pain, constipation, diarrhea, heartburn, nausea and vomiting.  Genitourinary:  Negative for dysuria.  Musculoskeletal:  Negative for joint pain and myalgias.  Skin:  Negative for rash.  Neurological:  Negative for dizziness.  Psychiatric/Behavioral:  Positive for depression. Negative for hallucinations, memory loss, substance abuse and suicidal ideas. The patient is nervous/anxious and has insomnia.   All other systems reviewed and are negative.  Blood pressure 105/75, pulse 83, temperature 98.4 F (36.9 C), temperature source Oral, resp. rate 16, height 4\' 11"  (1.499 m), weight 63 kg, SpO2 98%. Body mass index is 28.05 kg/m.  Treatment Plan Summary: Daily contact with patient to assess and evaluate symptoms and progress in treatment and Medication management.    Still waiting on safe disposition as patient would not be able to live independently given cognitive impairment. Recommend assisted living.    No medication side effects reported.  Because of urinary incontinence, recommended  adult diapers to be worn.   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  Plan:  -Continue Imitrex 25 mg PRN BID for migraines -Continue  Colace 100 mg daily for constipation -Continue Vitamin D 50.000 units weekly for bone health. -Continue Sertraline150 mg po Q daily for depression/anxiety -Continue Buspar 15 mg po bid for anxiety.  -Continue paliperidone 6 mg po qd for mood stabilization -Continue Trazodone 50 mg nightly for insomnia  -Continue Nicoderm 21 mg topically Q 24 hrs for nicotine withdrawal management. -Continue vitamin B12 1000 mcg IM weekly for 4 weeks then monthly -Continue Melatonin 5 mg nightly for sleep -Continue Protonix EC 40 mg p.o. daily for GERD -Continue MiraLAX 17 g p.o. PRN for constipation -Continue hydroxyzine 25 mg p.o. 3 times daily as needed for anxiety -Continue Ensure nutritional shakes TID in between meals  -Previously discontinued Hydroxyzine 50 mg nightly-hypotension in the mornings   Safety and Monitoring: Voluntary admission to inpatient psychiatric unit for safety, stabilization and treatment Daily contact with patient to assess and evaluate symptoms and progress in treatment Patient's case to be discussed in multi-disciplinary team meeting Observation Level : q15 minute checks Vital signs: q12 hours Precautions: Safety   Discharge Planning: Social work and case management to assist with discharge planning and identification of hospital follow-up needs prior to discharge Estimated LOS: Unknown at this time. Discharge Concerns: Need to establish a safety plan; Medication compliance and effectiveness Discharge Goals: Return home with outpatient referrals for mental health follow-up including medication management/psychotherapy No legal guardian, has payee  [name], NP,  04/10/2023, 4:44 PM Patient ID: Larey Brick, female   DOB: 1975-09-17, 48 y.o.   MRN: [id number]

## 2023-04-10 NOTE — Group Note (Signed)
Recreation Therapy Group Note   Group Topic:Other  Group Date: 04/10/2023 Start Time: 1405 End Time: 1450 Facilitators: Ruthmary Occhipinti-McCall, LRT,CTRS Location: [address] Dayroom   Activity Description/Intervention: Therapeutic Drumming. Patients with peers and staff were given the opportunity to engage in a leader facilitated [facility] Group Empowerment Drumming Circle with staff from the [employer], in partnership with [employer]. Teaching laboratory technician and trained [employer], Theodoro Doing leading with LRT observing and documenting intervention and pt response. This evidenced-based practice targets 7 areas of health and wellbeing in the human experience including: stress-reduction, exercise, self-expression, camaraderie/support, nurturing, spirituality, and music-making (leisure).   Goal Area(s) Addresses:  Patient will engage in pro-social way in music group.  Patient will follow directions of drum leader on the first prompt. Patient will demonstrate no behavioral issues during group.  Patient will identify if a reduction in stress level occurs as a result of participation in therapeutic drum circle.    Education: Leisure exposure, Coping skills, Musical expression, Discharge Planning  Yolanda [name] actively engaged in therapeutic drumming exercise and discussions. Pt was appropriate with peers, staff, and musical equipment for duration of programming.  Pt identified "ok" as their feeling after participation in music-based programming. Pt affect congruent/incongruent with verbalized emotion.    Affect/Mood: Appropriate   Participation Level: Engaged   Participation Quality: Independent   Behavior: Appropriate   Speech/Thought Process: Focused   Insight: Good   Judgement: Good   Modes of Intervention: Teaching laboratory technician   Patient Response to Interventions:  Engaged   Education Outcome:  In group clarification offered    Clinical  Observations/Individualized Feedback:    Plan: Continue to engage patient in RT group sessions 2-3x/week.   Yolanda [name], LRT,CTRS 04/10/2023 3:28 PM

## 2023-04-10 NOTE — Progress Notes (Signed)
   04/10/23 1013  Psych Admission Type (Psych Patients Only)  Admission Status Involuntary  Psychosocial Assessment  Patient Complaints Depression  Eye Contact Fair  Facial Expression Animated  Affect Depressed  Speech Logical/coherent  Interaction Childlike  Motor Activity Slow;Tremors  Appearance/Hygiene Disheveled  Behavior Characteristics Cooperative  Mood Depressed  Thought Process  Coherency WDL  Content WDL  Delusions None reported or observed  Perception WDL  Hallucination None reported or observed  Judgment Poor  Confusion None  Danger to Self  Current suicidal ideation? Denies  Agreement Not to Harm Self Yes  Description of Agreement verbal  Danger to Others  Danger to Others None reported or observed

## 2023-04-10 NOTE — Plan of Care (Signed)
  Problem: Activity: Goal: Interest or engagement in leisure activities will improve Outcome: Progressing Goal: Imbalance in normal sleep/wake cycle will improve Outcome: Progressing   

## 2023-04-10 NOTE — [facility] Group Notes (Signed)
Adult Psychoeducational Group Note  Date:  04/10/2023 Time:  9:53 PM  Group Topic/Focus:  Narcotics Anonymos  Participation Level:  Minimal  Participation Quality:  Attentive  Affect:  Appropriate  Cognitive:  Appropriate  Insight: Good  Engagement in Group:  Engaged  Modes of Intervention:  Education  Additional Comments:  Pt attend the NA group.,listen to the speaker's as they share their stories and, participated in the serenity prayer.  Ruperto Kiernan, Sharen Counter 04/10/2023, 9:53 PM

## 2023-04-10 NOTE — [facility] Group Notes (Signed)
Spiritual care group facilitated by Chaplain Dyanne Carrel, [facility]  Group focused on topic of strength. Group members reflected on what thoughts and feelings emerge when they hear this topic. They then engaged in facilitated dialog around how strength is present in their lives. This dialog focused on representing what strength had been to them in their lives (images and patterns given) and what they saw as helpful in their life now (what they needed / wanted).  Activity drew on narrative framework.  Patient Progress: Yolanda [name] attended group and actively engaged and participated in group conversation and activities.

## 2023-04-11 DIAGNOSIS — F251 Schizoaffective disorder, depressive type: Secondary | ICD-10-CM | POA: Diagnosis not present

## 2023-04-11 LAB — COMPREHENSIVE METABOLIC PANEL
ALT: 16 U/L (ref 0–44)
AST: 26 U/L (ref 15–41)
Albumin: 3.8 g/dL (ref 3.5–5.0)
Alkaline Phosphatase: 97 U/L (ref 38–126)
Anion gap: 9 (ref 5–15)
BUN: 16 mg/dL (ref 6–20)
CO2: 25 mmol/L (ref 22–32)
Calcium: 8.8 mg/dL — ABNORMAL LOW (ref 8.9–10.3)
Chloride: 100 mmol/L (ref 98–111)
Creatinine, Ser: 0.74 mg/dL (ref 0.44–1.00)
GFR, Estimated: >60 mL/min (ref >60)
Glucose, Bld: 104 mg/dL — ABNORMAL HIGH (ref 70–99)
Potassium: 4 mmol/L (ref 3.5–5.1)
Sodium: 134 mmol/L — ABNORMAL LOW (ref 135–145)
Total Bilirubin: 0.4 mg/dL (ref 0.3–1.2)
Total Protein: 7.8 g/dL (ref 6.5–8.1)

## 2023-04-11 LAB — CBC WITH DIFFERENTIAL/PLATELET
Abs Immature Granulocytes: 0.01 10*3/uL (ref 0.00–0.07)
Basophils Absolute: 0 10*3/uL (ref 0.0–0.1)
Basophils Relative: 1 %
Eosinophils Absolute: 0.3 10*3/uL (ref 0.0–0.5)
Eosinophils Relative: 4 %
HCT: 39.5 % (ref 36.0–46.0)
Hemoglobin: 12.1 g/dL (ref 12.0–15.0)
Immature Granulocytes: 0 %
Lymphocytes Relative: 37 %
Lymphs Abs: 2.2 10*3/uL (ref 0.7–4.0)
MCH: 28.2 pg (ref 26.0–34.0)
MCHC: 30.6 g/dL (ref 30.0–36.0)
MCV: 92.1 fL (ref 80.0–100.0)
Monocytes Absolute: 0.5 10*3/uL (ref 0.1–1.0)
Monocytes Relative: 8 %
Neutro Abs: 3 10*3/uL (ref 1.7–7.7)
Neutrophils Relative %: 50 %
Platelets: 202 10*3/uL (ref 150–400)
RBC: 4.29 MIL/uL (ref 3.87–5.11)
RDW: 13.3 % (ref 11.5–15.5)
WBC: 6 10*3/uL (ref 4.0–10.5)
nRBC: 0 % (ref 0.0–0.2)

## 2023-04-11 NOTE — Progress Notes (Signed)
[facility] MD Progress Note  04/11/2023 10:40 AM Yolanda [name]  MRN:  [id number]  Principal Problem: Schizoaffective disorder, depressive type (HCC)  Diagnosis: Principal Problem:   Schizoaffective disorder, depressive type (HCC) Active Problems:   GERD (gastroesophageal reflux disease)   Intellectual disability   Tobacco use disorder  Reason for admission: This is the first psychiatric admission in this [facility] in 12 years for this 54 AA female with an extensive hx of mental illnesses & probable polysubstance use disorders. She is admitted to the [facility] from the [facility] hospital with complain of worsening suicidal ideations with plan to stab herself. Per chart review, patient apparently reported at the ED that she has been depressed for a while & has not been taking her mental health medications. After medical evaluation.clearance, she was transferred to the [facility] for further psychiatric evaluation/treatments.   24 hr chart review: Patient is compliant with medications on the unit including psychotropic medications BuSpar 15 mg twice daily melatonin 5 mg at bedtime Invega 6 mg daily Zoloft 150 mg daily and trazodone 50 mg at bedtime, with no as needed medication needed for agitation aggression or anxiety, last dose of Atarax as needed for anxiety was given on 9/24  Patient assessment note: Patient was evaluated in her room she is lying down in bed with eyes open able to answer questions in a linear manner reporting fair sleep and appetite, denies passive or active SI intention or plan, denies HI or AVH, denies feeling hopeless or helpless, she reports feeling somehow depressed mainly related to not having a place to go to and I discussed with her continuing plan of placement with DSS.  She denies side effects to current medication regimen and does not display any sign consistent with EPS or TD, does not appear sleepy or sedated.  She continues to attend groups and participate in activities with the menu  with limitations at baseline.     Total Time spent with patient:  35 minutes  Past Psychiatric History:  See H & P  Past Medical History:  Past Medical History:  Diagnosis Date   Bipolar affect, depressed (HCC)    Constipation 08/17/2022   Depression    Falls 07/21/2022   Fracture of femoral neck, right, closed (HCC) 01/17/2022   Herpes simplex 08/22/2017   Open fracture dislocation of right elbow joint 01/17/2022    Past Surgical History:  Procedure Laterality Date   NO PAST SURGERIES     SALPINGECTOMY     Family History: History reviewed. No pertinent family history.  Family Psychiatric  History: See H & P  Social History:  Social History   Substance and Sexual Activity  Alcohol Use Yes     Social History   Substance and Sexual Activity  Drug Use Yes   Types: Cocaine, Marijuana    Social History   Socioeconomic History   Marital status: Single    Spouse name: Not on file   Number of children: Not on file   Years of education: Not on file   Highest education level: Not on file  Occupational History   Not on file  Tobacco Use   Smoking status: Every Day   Smokeless tobacco: Not on file  Substance and Sexual Activity   Alcohol use: Yes   Drug use: Yes    Types: Cocaine, Marijuana   Sexual activity: Yes  Other Topics Concern   Not on file  Social History Narrative   Not on file   Social Determinants  of Health   Financial Resource Strain: Low Risk  (09/12/2022)   Received from [facility], [facility]   Overall Financial Resource Strain (CARDIA)    Difficulty of Paying Living Expenses: Not hard at all  Food Insecurity: Patient Declined (12/20/2022)   Hunger Vital Sign    Worried About Running Out of Food in the Last Year: Patient declined    Ran Out of Food in the Last Year: Patient declined  Transportation Needs: No Transportation Needs (12/20/2022)   PRAPARE - Administrator, Civil Service (Medical): No    Lack of Transportation  (Non-Medical): No  Physical Activity: Not on file  Stress: No Stress Concern Present (07/17/2022)   Received from [facility], [facility] Occup[facility] - Occupational Stress Questionnaire    Feeling of Stress : Not at all  Social Connections: Unknown (07/16/2022)   Received from [facility], [facility]   Social Network    Social Network: Not on file   Sleep: Good  Appetite:  Good  Current Medications: Current Facility-Administered Medications  Medication Dose Route Frequency Provider Last Rate Last Admin   acetaminophen (TYLENOL) tablet 650 mg  650 mg Oral Q6H PRN [name], NP   650 mg at 04/05/23 2029   alum & mag hydroxide-simeth (MAALOX/MYLANTA) 200-200-20 MG/5ML suspension 30 mL  30 mL Oral Q4H PRN [name], NP   30 mL at 01/28/23 1530   busPIRone (BUSPAR) tablet 15 mg  15 mg Oral BID [name], NP   15 mg at 04/11/23 0865   cyanocobalamin (VITAMIN B12) injection 1,000 mcg  1,000 mcg Intramuscular Q30 days [name], Kashish Yglesias, MD   1,000 mcg at 04/03/23 1237   diphenhydrAMINE (BENADRYL) capsule 50 mg  50 mg Oral TID PRN [name], NP   50 mg at 01/15/23 1211   Or   diphenhydrAMINE (BENADRYL) injection 50 mg  50 mg Intramuscular TID PRN [name], NP       feeding supplement (ENSURE ENLIVE / ENSURE PLUS) liquid 237 mL  237 mL Oral TID BM Nkwenti, Doris, NP   237 mL at 04/10/23 2114   haloperidol (HALDOL) tablet 5 mg  5 mg Oral TID PRN [name], NP   5 mg at 01/15/23 1211   Or   haloperidol lactate (HALDOL) injection 5 mg  5 mg Intramuscular TID PRN [name], NP       hydrOXYzine (ATARAX) tablet 25 mg  25 mg Oral TID PRN [name], DO   25 mg at 04/02/23 2041   LORazepam (ATIVAN) tablet 2 mg  2 mg Oral TID PRN [name], NP   2 mg at 01/15/23 1211   Or   LORazepam (ATIVAN) injection 2 mg  2 mg Intramuscular TID PRN [name], NP       magnesium hydroxide (MILK OF MAGNESIA) suspension 30 mL  30 mL Oral Daily  PRN [name], NP   30 mL at 04/06/23 1824   melatonin tablet 5 mg  5 mg Oral QHS Nkwenti, Doris, NP   5 mg at 04/10/23 2116   nicotine (NICODERM CQ - dosed in mg/24 hours) patch 21 mg  21 mg Transdermal Daily [name], DO   21 mg at 03/25/23 0951   nicotine polacrilex (NICORETTE) gum 2 mg  2 mg Oral PRN [name], MD   2 mg at 04/11/23 0813   paliperidone (INVEGA) 24 hr tablet 6 mg  6 mg Oral Daily  [name], NP   6 mg at 04/10/23 2115   pantoprazole (PROTONIX) EC tablet 40 mg  40 mg Oral Daily [name], NP   40 mg at 04/11/23 0811   polyethylene glycol (MIRALAX / GLYCOLAX) packet 17 g  17 g Oral Daily PRN [name], NP   17 g at 04/03/23 1829   sertraline (ZOLOFT) tablet 150 mg  150 mg Oral Daily Massengill, Harrold Donath, MD   150 mg at 04/11/23 3151   SUMAtriptan (IMITREX) tablet 25 mg  25 mg Oral BID PRN [name], NP   25 mg at 04/04/23 1832   traZODone (DESYREL) tablet 50 mg  50 mg Oral QHS [name], NP   50 mg at 04/10/23 2116   Vitamin D (Ergocalciferol) (DRISDOL) 1.25 MG (50000 UNIT) capsule 50,000 Units  50,000 Units Oral Q7 days [name], NP   50,000 Units at 04/06/23 1725   Lab Results:  No results found for this or any previous visit (from the past 48 hour(s)).   Blood Alcohol level:  Lab Results  Component Value Date   ETH <10 12/19/2022   ETH <10 11/21/2019   Metabolic Disorder Labs: Lab Results  Component Value Date   HGBA1C 4.4 (L) 12/21/2022   MPG 79.58 12/21/2022   No results found for: "PROLACTIN" Lab Results  Component Value Date   CHOL 218 (H) 12/21/2022   TRIG 81 12/21/2022   HDL 53 12/21/2022   CHOLHDL 4.1 12/21/2022   VLDL 16 12/21/2022   LDLCALC 149 (H) 12/21/2022   LDLCALC 110 (H) 03/13/2011   Physical Findings: AIMS: Facial and Oral Movements Muscles of Facial Expression: None, normal Lips and Perioral Area: None, normal Jaw: None, normal Tongue: None, normal,Extremity Movements Upper (arms, wrists,  hands, fingers): None, normal Lower (legs, knees, ankles, toes): None, normal, Trunk Movements Neck, shoulders, hips: None, normal, Overall Severity Severity of abnormal movements (highest score from questions above): None, normal Incapacitation due to abnormal movements: None, normal Patient's awareness of abnormal movements (rate only patient's report): No Awareness, Dental Status Current problems with teeth and/or dentures?: No Does patient usually wear dentures?: No  CIWA:    COWS:    AIMS:0 Musculoskeletal: Strength & Muscle Tone: within normal limits Gait & Station: normal Patient leans: N/A  Psychiatric Specialty Exam:  Presentation  General Appearance:  Disheveled  Eye Contact: Limited to baseline Speech: Decreased amount, decreased tone and volume but at baseline Speech Volume: Decreased  Handedness: Right  Mood and Affect  Mood: Sad and depressed mainly secondary to being in the hospital with no place to go Affect: Congruent  Thought Process  Thought Processes: Coherent  Descriptions of Associations:Intact  Orientation:Partial  Thought Content:Logical Concrete History of Schizophrenia/Schizoaffective disorder:Yes  Duration of Psychotic Symptoms:Greater than six months  Hallucinations:Hallucinations: None   Ideas of Reference: None  Suicidal Thoughts:Suicidal Thoughts: No   Homicidal Thoughts:Homicidal Thoughts: No  Sensorium  Memory: Recent Poor  Judgment: Fair  Insight: Fair  Art therapist  Concentration: Poor  Attention Span: Poor  Recall: Poor  Fund of Knowledge: Poor  Language: Fair  Psychomotor Activity  Psychomotor Activity: Psychomotor Activity: Normal   Assets  Assets: Resilience  Sleep  Sleep: Sleep: Good   Physical Exam: Physical Exam Vitals and nursing note reviewed.  HENT:     Nose: Nose normal.     Mouth/Throat:     Pharynx: Oropharynx is clear.  Eyes:     Pupils: Pupils are  equal, round, and reactive to light.  Cardiovascular:     Rate and Rhythm: Normal rate.     Pulses: Normal pulses.  Pulmonary:     Effort: Pulmonary effort is normal.  Genitourinary:    Comments: Deferred Musculoskeletal:        General: Normal range of motion.     Cervical back: Normal range of motion.  Skin:    General: Skin is warm and dry.  Neurological:     General: No focal deficit present.     Mental Status: She is alert and oriented to person, place, and time.  Psychiatric:        Mood and Affect: Mood normal.        Behavior: Behavior normal.        Thought Content: Thought content normal.    Review of Systems  Constitutional:  Negative for chills, diaphoresis and fever.  HENT:  Negative for congestion, hearing loss and sore throat.   Eyes:  Negative for blurred vision.  Respiratory:  Negative for cough, shortness of breath and wheezing.   Cardiovascular:  Negative for chest pain and palpitations.  Gastrointestinal:  Negative for abdominal pain, constipation, diarrhea, heartburn, nausea and vomiting.  Genitourinary:  Negative for dysuria.  Musculoskeletal:  Negative for joint pain and myalgias.  Skin:  Negative for rash.  Neurological:  Negative for dizziness.  Psychiatric/Behavioral:  Positive for depression. Negative for hallucinations, memory loss, substance abuse and suicidal ideas.   All other systems reviewed and are negative.  Blood pressure 105/75, pulse 83, temperature 98.4 F (36.9 C), temperature source Oral, resp. rate 16, height 4\' 11"  (1.499 m), weight 63 kg, SpO2 98%. Body mass index is 28.05 kg/m.  Treatment Plan Summary: Daily contact with patient to assess and evaluate symptoms and progress in treatment and Medication management.    Still waiting on safe disposition as patient would not be able to live independently given cognitive impairment. Recommend assisted living.    No medication side effects reported.  Because of urinary incontinence,  recommended adult diapers to be worn.   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  Plan:  -Continue Imitrex 25 mg PRN BID for migraines -Continue Colace 100 mg daily for constipation -Continue Vitamin D 50.000 units weekly for bone health. -Continue Sertraline150 mg po Q daily for depression/anxiety -Continue Buspar 15 mg po bid for anxiety.  -Continue paliperidone 6 mg po qd for mood stabilization -Continue Trazodone 50 mg nightly for insomnia  -Continue Nicoderm 21 mg topically Q 24 hrs for nicotine withdrawal management. -Continue vitamin B12 1000 mcg IM weekly for 4 weeks then monthly -Continue Melatonin 5 mg nightly for sleep -Continue Protonix EC 40 mg p.o. daily for GERD -Continue MiraLAX 17 g p.o. PRN for constipation -Continue hydroxyzine 25 mg p.o. 3 times daily as needed for anxiety -Continue Ensure nutritional shakes TID in between meals  -Previously discontinued Hydroxyzine 50 mg nightly-hypotension in the mornings    Last lab work was completed on 9/3, will obtain CBC, CMP on 10/2 and follow-up.   Safety and Monitoring: Voluntary admission to inpatient psychiatric unit for safety, stabilization and treatment Daily contact with patient to assess and evaluate symptoms and progress in treatment Patient's case to be discussed in multi-disciplinary team meeting Observation Level : q15 minute checks Vital signs: q12 hours Precautions: Safety   Discharge Planning: Social work and case management to assist with  discharge planning and identification of hospital follow-up needs prior to discharge Estimated LOS: Unknown at this time. Discharge Concerns: Need to establish a safety plan; Medication compliance and effectiveness Discharge  Goals: Return home with outpatient referrals for mental health follow-up including medication management/psychotherapy No legal guardian, has payee  Sarita Bottom, MD,  04/11/2023, 10:40 AM

## 2023-04-11 NOTE — [facility] Group Notes (Signed)
[facility] Group Notes:  (Nursing/MHT/Case Management/Adjunct)  Date:  04/11/2023  Time:  9:14 PM  Type of Therapy:  The focus of this group is to help patients establish daily goals to achieve during treatment and discuss how the patient can incorporate goal setting into their daily lives to aide in recovery.   Participation Level:  Active  Participation Quality:  Appropriate  Affect:  Appropriate  Cognitive:  Alert and Appropriate  Insight:  Appropriate and Good  Engagement in Group:  Engaged and Supportive  Modes of Intervention:  Socialization and Support  Summary of Progress/Problems: Pt attended group  Yolanda [name] 04/11/2023, 9:14 PM

## 2023-04-11 NOTE — Progress Notes (Signed)
   04/10/23 2035  Psych Admission Type (Psych Patients Only)  Admission Status Involuntary  Psychosocial Assessment  Patient Complaints Depression  Eye Contact Fair  Facial Expression Animated  Affect Depressed  Speech Logical/coherent  Interaction Childlike  Motor Activity Slow;Tremors  Appearance/Hygiene Unremarkable  Behavior Characteristics Cooperative;Appropriate to situation  Thought Process  Coherency WDL  Content WDL  Delusions WDL  Perception WDL  Hallucination None reported or observed  Judgment Poor  Confusion None  Danger to Self  Current suicidal ideation? Denies  Self-Injurious Behavior No self-injurious ideation or behavior indicators observed or expressed   Agreement Not to Harm Self Yes  Description of Agreement verbal  Danger to Others  Danger to Others None reported or observed

## 2023-04-11 NOTE — Progress Notes (Signed)
   04/11/23 2200  Psych Admission Type (Psych Patients Only)  Admission Status Involuntary  Psychosocial Assessment  Patient Complaints Anxiety  Eye Contact Fair  Facial Expression Animated  Affect Blunted  Speech Logical/coherent  Interaction Cautious  Motor Activity Slow  Appearance/Hygiene Unremarkable  Behavior Characteristics Cooperative;Appropriate to situation  Mood Anxious  Thought Process  Coherency WDL  Content WDL  Delusions None reported or observed  Perception WDL  Hallucination None reported or observed  Judgment Limited  Confusion None  Danger to Self  Current suicidal ideation? Denies  Self-Injurious Behavior No self-injurious ideation or behavior indicators observed or expressed   Agreement Not to Harm Self Yes  Description of Agreement verbal  Danger to Others  Danger to Others None reported or observed

## 2023-04-11 NOTE — Progress Notes (Signed)
   04/11/23 0811  Psych Admission Type (Psych Patients Only)  Admission Status Involuntary  Psychosocial Assessment  Patient Complaints None  Eye Contact Fair  Facial Expression Animated  Affect Appropriate to circumstance  Speech Logical/coherent  Interaction Childlike  Motor Activity Slow;Tremors  Appearance/Hygiene Disheveled  Behavior Characteristics Cooperative  Thought Process  Coherency WDL  Content WDL  Delusions None reported or observed  Perception WDL  Hallucination None reported or observed  Judgment Limited  Confusion None  Danger to Self  Current suicidal ideation? Denies  Self-Injurious Behavior No self-injurious ideation or behavior indicators observed or expressed   Agreement Not to Harm Self Yes  Description of Agreement verbal  Danger to Others  Danger to Others None reported or observed

## 2023-04-11 NOTE — Group Note (Signed)
Date:  04/11/2023 Time:  9:14 AM  Group Topic/Focus:  Goals Group:   The focus of this group is to help patients establish daily goals to achieve during treatment and discuss how the patient can incorporate goal setting into their daily lives to aide in recovery.    Participation Level:  Did Not Attend  Participation Quality:   NA  Affect:   NA  Cognitive:   NA  Insight: None  Engagement in Group:   NA  Modes of Intervention:   NA  Additional Comments:  NA  Beckie Busing 04/11/2023, 9:14 AM

## 2023-04-11 NOTE — Group Note (Signed)
LCSW Group Therapy Note   Group Date: 04/11/2023 Start Time: 1100 End Time: 1200   Type of Therapy and Topic:  Group Therapy: Boundaries  Participation Level:  Did Not Attend  Description of Group: This group will address the use of boundaries in their personal lives. Patients will explore why boundaries are important, the difference between healthy and unhealthy boundaries, and negative and postive outcomes of different boundaries and will look at how boundaries can be crossed.  Patients will be encouraged to identify current boundaries in their own lives and identify what kind of boundary is being set. Facilitators will guide patients in utilizing problem-solving interventions to address and correct types boundaries being used and to address when no boundary is being used. Understanding and applying boundaries will be explored and addressed for obtaining and maintaining a balanced life. Patients will be encouraged to explore ways to assertively make their boundaries and needs known to significant others in their lives, using other group members and facilitator for role play, support, and feedback.  Therapeutic Goals:  1.  Patient will identify areas in their life where setting clear boundaries could be  used to improve their life.  2.  Patient will identify signs/triggers that a boundary is not being respected. 3.  Patient will identify two ways to set boundaries in order to achieve balance in  their lives: 4.  Patient will demonstrate ability to communicate their needs and set boundaries  through discussion and/or role plays  Summary of Patient Progress:  No Show  Therapeutic Modalities:   Cognitive Behavioral Therapy Solution-Focused Therapy  Marinda Elk, LCSWA 04/11/2023  3:01 PM

## 2023-04-12 DIAGNOSIS — F251 Schizoaffective disorder, depressive type: Secondary | ICD-10-CM | POA: Diagnosis not present

## 2023-04-12 NOTE — Plan of Care (Signed)
  Problem: Activity: Goal: Interest or engagement in leisure activities will improve Outcome: Progressing Goal: Imbalance in normal sleep/wake cycle will improve Outcome: Progressing   

## 2023-04-12 NOTE — Group Note (Signed)
Recreation Therapy Group Note   Group Topic:Problem Solving  Group Date: 04/12/2023 Start Time: 0930 End Time: 0950 Facilitators: Milliana Reddoch-McCall, LRT,CTRS Location: [address] Dayroom   Group Topic: Communication, Team Building, Problem Solving  Goal Area(s) Addresses:  Patient will effectively work with peer towards shared goal.  Patient will identify skills used to make activity successful.  Patient will identify how skills used during activity can be applied to reach post d/c goals.   Intervention: STEM Activity- Glass blower/designer  Group Description: Tallest Pharmacist, community. In teams of 5-6, patients were given 11 craft pipe cleaners. Using the materials provided, patients were instructed to compete again the opposing team(s) to build the tallest free-standing structure from floor level. The activity was timed; difficulty increased by Clinical research associate as Production designer, theatre/television/film continued.  Systematically resources were removed with additional directions for example, placing one arm behind their back, working in silence, and shape stipulations. LRT facilitated post-activity discussion reviewing team processes and necessary communication skills involved in completion. Patients were encouraged to reflect how the skills utilized, or not utilized, in this activity can be incorporated to positively impact support systems post discharge.  Education: Pharmacist, community, Scientist, physiological, Discharge Planning   Education Outcome: Acknowledges education/In group clarification offered/Needs additional education.    Affect/Mood: N/A   Participation Level: Did not attend    Clinical Observations/Individualized Feedback:     Plan: Continue to engage patient in RT group sessions 2-3x/week.   Kery Haltiwanger-McCall, LRT,CTRS 04/12/2023 11:57 AM

## 2023-04-12 NOTE — Progress Notes (Signed)
   04/12/23 2219  Psych Admission Type (Psych Patients Only)  Admission Status Involuntary  Psychosocial Assessment  Patient Complaints Anxiety  Eye Contact Fair  Facial Expression Animated  Affect Appropriate to circumstance  Speech Logical/coherent  Interaction Assertive  Motor Activity Slow  Appearance/Hygiene Unremarkable  Behavior Characteristics Appropriate to situation  Mood Depressed;Pleasant  Thought Process  Coherency WDL  Content Blaming self  Delusions None reported or observed  Perception WDL  Hallucination None reported or observed  Judgment Limited  Confusion None  Danger to Self  Current suicidal ideation? Denies  Self-Injurious Behavior No self-injurious ideation or behavior indicators observed or expressed   Agreement Not to Harm Self Yes  Description of Agreement verbal  Danger to Others  Danger to Others None reported or observed

## 2023-04-12 NOTE — [facility] Group Notes (Signed)
[facility] Group Notes:  (Nursing/MHT/Case Management/Adjunct)  Date:  04/12/2023  Time:  9:56 PM  Type of Therapy:    Wrap Up Group    Participation Level:  Active  Participation Quality:  Appropriate  Affect:  Appropriate  Cognitive:  Appropriate  Insight:  Appropriate  Engagement in Group:  Developing/Improving  Modes of Intervention:  Discussion and Support  Summary of Progress/Problems: Pt. Attended AA. Yolanda [name] 04/12/2023, 9:56 PM

## 2023-04-12 NOTE — Plan of Care (Signed)
  Problem: Education: Goal: Utilization of techniques to improve thought processes will improve Outcome: Progressing   Problem: Activity: Goal: Interest or engagement in leisure activities will improve Outcome: Progressing   Problem: Coping: Goal: Coping ability will improve Outcome: Progressing   Problem: Health Behavior/Discharge Planning: Goal: Ability to make decisions will improve Outcome: Progressing

## 2023-04-12 NOTE — Group Note (Deleted)
Date:  04/12/2023 Time:  10:41 AM  Group Topic/Focus:  Goals Group:   The focus of this group is to help patients establish daily goals to achieve during treatment and discuss how the patient can incorporate goal setting into their daily lives to aide in recovery.     Participation Level:  {B[facility] PARTICIPATION ZOXWR:60454}  Participation Quality:  {B[facility] PARTICIPATION QUALITY:22265}  Affect:  {B[facility] AFFECT:22266}  Cognitive:  {B[facility] COGNITIVE:22267}  Insight: {BHH Insight2:20797}  Engagement in Group:  {B[facility] ENGAGEMENT IN UJWJX:91478}  Modes of Intervention:  {B[facility] MODES OF INTERVENTION:22269}  Additional Comments:  ***  Beckie Busing 04/12/2023, 10:41 AM

## 2023-04-12 NOTE — Progress Notes (Signed)
[facility] MD Progress Note  04/12/2023 5:00 PM Yolanda [name]  MRN:  [id number]  Principal Problem: Schizoaffective disorder, depressive type (HCC)  Diagnosis: Principal Problem:   Schizoaffective disorder, depressive type (HCC) Active Problems:   GERD (gastroesophageal reflux disease)   Intellectual disability   Tobacco use disorder  Reason for admission: This is the first psychiatric admission in this [facility] in 12 years for this 47 AA female with an extensive hx of mental illnesses & probable polysubstance use disorders. She is admitted to the [facility] from the [facility] hospital with complain of worsening suicidal ideations with plan to stab herself. Per chart review, patient apparently reported at the ED that she has been depressed for a while & has not been taking her mental health medications. After medical evaluation.clearance, she was transferred to the [facility] for further psychiatric evaluation/treatments.   24 hr chart review: Is within normal limits, continues to take medications as scheduled, no as needed medications given in the past 24 hours, no agitation protocol medications administered in the past 24 hours.  Denies not to be engaging or attending unit group sessions.  No behavioral concerns noted from nursing.  Patient assessment note: Upon entering the patient's room earlier today morning, she had a blanket over her head, took it off after several verbal prompts and positive reinforcements.  Pt continues to present with flat affect and depressed mood, attention to personal hygiene and grooming remains poor, eye contact is good, speech remains somewhat garbled, but this is her baseline. Thought contents are organized and logical, and pt currently denies SI/HI/AVH or paranoia. There is no evidence of delusional thoughts.    Patient reports a good sleep quality last night, reports that her appetite is moderate, states she has not been eating breakfast, but states "I am not a breakfast person".   Patient also verbalizes her disinterest in continuing to eat the hospital food, states that she would like food from outside of the hospital, but also states she does not have anybody to bring her food here.  She verbalizes feeling sad that her daughter was visiting, but has stopped visiting.  Patient is not attending to personal hygiene, and is preferring to lie in bed for most of the day and do nothing.  Patient states "that is because there is nothing to do here".  She is seemingly airing her frustration at this continuous hospitalization.  Writer reiterated the fact that we are continuing to coordinate with CSW, and DSS, to get her an appropriate placement outside of the hospital environment.  Staff has been informed to continue to provide positive reinforcements for patient to indulge in personal care such as cleaning after herself, taking showers, coming out of the room and indulging in unit group activities, instead of lying in bed all day long.  Patient also educated on the need to do so and verbalizes understanding.  Mood is currently stable for management outside of the hospital environment, but we continue to await DSS to come up with a plan for placement, as patient remains incompetent to take care of herself independently outside of the hospital environment.  None of her family members is willing to take her into the home.  We will continue medications as listed below, and we will continue to provide support for patient, and continue to positively reinforced for her to get out of her room and engage in unit activities. No TD/EPS type symptoms found on assessment, and pt denies any feelings of stiffness. AIMS: 0.  Total Time spent with patient:  45 minutes  Past Psychiatric History:  See H & P  Past Medical History:  Past Medical History:  Diagnosis Date   Bipolar affect, depressed (HCC)    Constipation 08/17/2022   Depression    Falls 07/21/2022   Fracture of femoral neck, right, closed  (HCC) 01/17/2022   Herpes simplex 08/22/2017   Open fracture dislocation of right elbow joint 01/17/2022    Past Surgical History:  Procedure Laterality Date   NO PAST SURGERIES     SALPINGECTOMY     Family History: History reviewed. No pertinent family history.  Family Psychiatric  History: See H & P  Social History:  Social History   Substance and Sexual Activity  Alcohol Use Yes     Social History   Substance and Sexual Activity  Drug Use Yes   Types: Cocaine, Marijuana    Social History   Socioeconomic History   Marital status: Single    Spouse name: Not on file   Number of children: Not on file   Years of education: Not on file   Highest education level: Not on file  Occupational History   Not on file  Tobacco Use   Smoking status: Every Day   Smokeless tobacco: Not on file  Substance and Sexual Activity   Alcohol use: Yes   Drug use: Yes    Types: Cocaine, Marijuana   Sexual activity: Yes  Other Topics Concern   Not on file  Social History Narrative   Not on file   Social Determinants of Health   Financial Resource Strain: Low Risk  (09/12/2022)   Received from [facility]   Overall Financial Resource Strain (CARDIA)    Difficulty of Paying Living Expenses: Not hard at all  Food Insecurity: Patient Declined (12/20/2022)   Hunger Vital Sign    Worried About Running Out of Food in the Last Year: Patient declined    Ran Out of Food in the Last Year: Patient declined  Transportation Needs: No Transportation Needs (12/20/2022)   PRAPARE - Administrator, Civil Service (Medical): No    Lack of Transportation (Non-Medical): No  Physical Activity: Not on file  Stress: No Stress Concern Present (07/17/2022)   Received from [facility] - Occupational Stress Questionnaire    Feeling of Stress : Not at all  Social Connections: Unknown (07/16/2022)   Received from [facility]   Social Network    Social Network: Not on file   Sleep: Good  Appetite:  Good  Current Medications: Current Facility-Administered Medications  Medication Dose Route Frequency Provider Last Rate Last Admin   acetaminophen (TYLENOL) tablet 650 mg  650 mg Oral Q6H PRN [name], NP   650 mg at 04/05/23 2029   alum & mag hydroxide-simeth (MAALOX/MYLANTA) 200-200-20 MG/5ML suspension 30 mL  30 mL Oral Q4H PRN [name], NP   30 mL at 01/28/23 1530   busPIRone (BUSPAR) tablet 15 mg  15 mg Oral BID [name], NP   15 mg at 04/12/23 1631   cyanocobalamin (VITAMIN B12) injection 1,000 mcg  1,000 mcg Intramuscular Q30 days Sarita Bottom, MD   1,000 mcg at 04/03/23 1237   diphenhydrAMINE (BENADRYL) capsule 50 mg  50 mg Oral TID PRN [name], NP   50 mg at 01/15/23 1211   Or   diphenhydrAMINE (BENADRYL) injection 50 mg  50 mg  Intramuscular TID PRN [name], NP       feeding supplement (ENSURE ENLIVE / ENSURE PLUS) liquid 237 mL  237 mL Oral TID BM [name], NP   237 mL at 04/11/23 2113   haloperidol (HALDOL) tablet 5 mg  5 mg Oral TID PRN [name], NP   5 mg at 01/15/23 1211   Or   haloperidol lactate (HALDOL) injection 5 mg  5 mg Intramuscular TID PRN [name], NP       hydrOXYzine (ATARAX) tablet 25 mg  25 mg Oral TID PRN [name], DO   25 mg at 04/02/23 2041   LORazepam (ATIVAN) tablet 2 mg  2 mg Oral TID PRN [name], NP   2 mg at 01/15/23 1211   Or   LORazepam (ATIVAN) injection 2 mg  2 mg Intramuscular TID PRN [name], NP       magnesium hydroxide (MILK OF MAGNESIA) suspension 30 mL  30 mL Oral Daily PRN [name], NP   30 mL at 04/06/23 1824   melatonin tablet 5 mg  5 mg Oral QHS [name], NP   5 mg at 04/11/23 2113   nicotine (NICODERM CQ - dosed in mg/24 hours) patch 21 mg  21 mg Transdermal Daily [name], DO   21 mg at 03/25/23 0272   nicotine polacrilex (NICORETTE) gum 2 mg  2 mg Oral PRN [name], MD   2 mg at 04/12/23 1558   paliperidone (INVEGA) 24 hr tablet 6 mg  6 mg Oral Daily [name], NP   6 mg at 04/11/23 2114   pantoprazole (PROTONIX) EC tablet 40 mg  40 mg Oral Daily [name], NP   40 mg at 04/12/23 0804   polyethylene glycol (MIRALAX / GLYCOLAX) packet 17 g  17 g Oral Daily PRN [name], NP   17 g at 04/03/23 1829   sertraline (ZOLOFT) tablet 150 mg  150 mg Oral Daily Massengill, Harrold Donath, MD   150 mg at 04/12/23 0803   SUMAtriptan (IMITREX) tablet 25 mg  25 mg Oral BID PRN [name], NP   25 mg at 04/04/23 1832   traZODone (DESYREL) tablet 50 mg  50 mg Oral QHS [name], NP   50 mg at 04/11/23 2114   Vitamin D (Ergocalciferol) (DRISDOL) 1.25 MG (50000 UNIT) capsule 50,000 Units  50,000 Units Oral Q7 days [name], NP   50,000 Units at 04/06/23 1725   Lab Results:  Results for orders placed or performed during the hospital encounter of 12/20/22 (from the past 48 hour(s))  CBC with Differential/Platelet     Status: None   Collection Time: 04/11/23  6:32 PM  Result Value Ref Range   WBC 6.0 4.0 - 10.5 K/uL   RBC 4.29 3.87 - 5.11 MIL/uL   Hemoglobin 12.1 12.0 - 15.0 g/dL   HCT 53.6 64.4 - 03.4 %   MCV 92.1 80.0 - 100.0 fL   MCH 28.2 26.0 - 34.0 pg   MCHC 30.6 30.0 - 36.0 g/dL   RDW 74.2 59.5 - 63.8 %   Platelets 202 150 - 400 K/uL   nRBC 0.0 0.0 - 0.2 %   Neutrophils Relative % 50 %   Neutro Abs 3.0 1.7 - 7.7 K/uL   Lymphocytes Relative 37 %   Lymphs Abs 2.2 0.7 - 4.0 K/uL   Monocytes Relative 8 %   Monocytes Absolute 0.5 0.1 - 1.0 K/uL   Eosinophils Relative  4 %   Eosinophils Absolute 0.3 0.0 - 0.5 K/uL   Basophils Relative 1 %   Basophils Absolute 0.0 0.0 - 0.1 K/uL   Immature Granulocytes 0 %   Abs Immature Granulocytes 0.01 0.00 - 0.07 K/uL    Comment: Performed at [facility], [address] [place], [place] [address]  Comprehensive metabolic panel     Status: Abnormal   Collection Time:  04/11/23  6:32 PM  Result Value Ref Range   Sodium 134 (L) 135 - 145 mmol/L   Potassium 4.0 3.5 - 5.1 mmol/L   Chloride 100 98 - 111 mmol/L   CO2 25 22 - 32 mmol/L   Glucose, Bld 104 (H) 70 - 99 mg/dL    Comment: Glucose reference range applies only to samples taken after fasting for at least 8 hours.   BUN 16 6 - 20 mg/dL   Creatinine, Ser 6.04 0.44 - 1.00 mg/dL   Calcium 8.8 (L) 8.9 - 10.3 mg/dL   Total Protein 7.8 6.5 - 8.1 g/dL   Albumin 3.8 3.5 - 5.0 g/dL   AST 26 15 - 41 U/L   ALT 16 0 - 44 U/L   Alkaline Phosphatase 97 38 - 126 U/L   Total Bilirubin 0.4 0.3 - 1.2 mg/dL   GFR, Estimated >54 >09 mL/min    Comment: (NOTE) Calculated using the CKD-EPI Creatinine Equation (2021)    Anion gap 9 5 - 15    Comment: Performed at [facility], [address], [place], [place] [address]     Blood Alcohol level:  Lab Results  Component Value Date   [facility] <10 12/19/2022   ETH <10 11/21/2019   Metabolic Disorder Labs: Lab Results  Component Value Date   HGBA1C 4.4 (L) 12/21/2022   MPG 79.58 12/21/2022   No results found for: "PROLACTIN" Lab Results  Component Value Date   CHOL 218 (H) 12/21/2022   TRIG 81 12/21/2022   HDL 53 12/21/2022   CHOLHDL 4.1 12/21/2022   VLDL 16 12/21/2022   LDLCALC 149 (H) 12/21/2022   LDLCALC 110 (H) 03/13/2011   Physical Findings: AIMS: Facial and Oral Movements Muscles of Facial Expression: None, normal Lips and Perioral Area: None, normal Jaw: None, normal Tongue: None, normal,Extremity Movements Upper (arms, wrists, hands, fingers): None, normal Lower (legs, knees, ankles, toes): None, normal, Trunk Movements Neck, shoulders, hips: None, normal, Overall Severity Severity of abnormal movements (highest score from questions above): None, normal Incapacitation due to abnormal movements: None, normal Patient's awareness of abnormal movements (rate only patient's report): No Awareness, Dental Status Current problems with  teeth and/or dentures?: No Does patient usually wear dentures?: No  CIWA:    COWS:    AIMS:0 Musculoskeletal: Strength & Muscle Tone: within normal limits Gait & Station: normal Patient leans: N/A  Psychiatric Specialty Exam:  Presentation  General Appearance:  Disheveled  Eye Contact: Limited to baseline Speech: Decreased amount, decreased tone and volume but at baseline Speech Volume: Decreased  Handedness: Right  Mood and Affect  Mood: Sad and depressed mainly secondary to being in the hospital with no place to go Affect: Congruent  Thought Process  Thought Processes: Coherent  Descriptions of Associations:Intact  Orientation:Partial  Thought Content:Logical [place] History of Schizophrenia/Schizoaffective disorder:Yes  Duration of Psychotic Symptoms:Greater than six months  Hallucinations:Hallucinations: None    Ideas of Reference: None  Suicidal Thoughts:Suicidal Thoughts: No    Homicidal Thoughts:Homicidal Thoughts: No   Sensorium  Memory: Immediate Poor  Judgment: Fair  Insight: Poor  Executive Functions  Concentration: Poor  Attention Span: Fair  Recall: Poor  Fund of Knowledge: Poor  Language: Fair  Lexicographer Activity: Psychomotor Activity: Normal    Assets  Assets: Resilience  Sleep  Sleep: Sleep: Good    Physical Exam: Physical Exam Vitals and nursing note reviewed.  HENT:     Nose: Nose normal.     Mouth/Throat:     Pharynx: Oropharynx is clear.  Eyes:     Pupils: Pupils are equal, round, and reactive to light.  Cardiovascular:     Rate and Rhythm: Normal rate.     Pulses: Normal pulses.  Pulmonary:     Effort: Pulmonary effort is normal.  Genitourinary:    Comments: Deferred Musculoskeletal:        General: Normal range of motion.     Cervical back: Normal range of motion.  Skin:    General: Skin is warm and dry.  Neurological:     General: No focal deficit  present.     Mental Status: She is alert and oriented to person, place, and time.  Psychiatric:        Mood and Affect: Mood normal.        Behavior: Behavior normal.        Thought Content: Thought content normal.    Review of Systems  Constitutional:  Negative for chills, diaphoresis and fever.  HENT:  Negative for congestion, hearing loss and sore throat.   Eyes:  Negative for blurred vision.  Respiratory:  Negative for cough, shortness of breath and wheezing.   Cardiovascular:  Negative for chest pain and palpitations.  Gastrointestinal:  Negative for abdominal pain, constipation, diarrhea, heartburn, nausea and vomiting.  Genitourinary:  Negative for dysuria.  Musculoskeletal:  Negative for joint pain and myalgias.  Skin:  Negative for rash.  Neurological:  Negative for dizziness.  Psychiatric/Behavioral:  Positive for depression. Negative for hallucinations, memory loss, substance abuse and suicidal ideas. The patient is nervous/anxious and has insomnia.   All other systems reviewed and are negative.  Blood pressure 95/71, pulse 100, temperature 98.1 F (36.7 C), temperature source Oral, resp. rate 16, height 4\' 11"  (1.499 m), weight 63 kg, SpO2 100%. Body mass index is 28.05 kg/m.  Treatment Plan Summary: Daily contact with patient to assess and evaluate symptoms and progress in treatment and Medication management.    Still waiting on safe disposition as patient would not be able to live independently given cognitive impairment. Recommend assisted living.    No medication side effects reported.  Because of urinary incontinence, recommended adult diapers to be worn.   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  Plan:  -Continue Imitrex 25  mg PRN BID for migraines -Continue Colace 100 mg daily for constipation -Continue Vitamin D 50.000 units weekly for bone health. -Continue Sertraline150 mg po Q daily for depression/anxiety -Continue Buspar 15 mg po bid for anxiety.  -Continue paliperidone 6 mg po qd for mood stabilization -Continue Trazodone 50 mg nightly for insomnia  -Continue Nicoderm 21 mg topically Q 24 hrs for nicotine withdrawal management. -Continue vitamin B12 1000 mcg IM weekly for 4 weeks then monthly -Continue Melatonin 5 mg nightly for sleep -Continue Protonix EC 40 mg p.o. daily for GERD -Continue MiraLAX 17 g p.o. PRN for constipation -Continue hydroxyzine 25 mg  p.o. 3 times daily as needed for anxiety -Continue Ensure nutritional shakes TID in between meals  -Previously discontinued Hydroxyzine 50 mg nightly-hypotension in the mornings   Safety and Monitoring: Voluntary admission to inpatient psychiatric unit for safety, stabilization and treatment Daily contact with patient to assess and evaluate symptoms and progress in treatment Patient's case to be discussed in multi-disciplinary team meeting Observation Level : q15 minute checks Vital signs: q12 hours Precautions: Safety   Discharge Planning: Social work and case management to assist with discharge planning and identification of hospital follow-up needs prior to discharge Estimated LOS: Unknown at this time. Discharge Concerns: Need to establish a safety plan; Medication compliance and effectiveness Discharge Goals: Return home with outpatient referrals for mental health follow-up including medication management/psychotherapy No legal guardian, has payee  [name], NP,  04/12/2023, 5:00 PM Patient ID: Yolanda [name], female   DOB: [birth year], 47 y.o.   MRN: [id number]  Patient ID: SRIYA KROEZE, female   DOB: [birth year], 47 y.o.   MRN: [id number]

## 2023-04-12 NOTE — Group Note (Signed)
Date:  04/12/2023 Time:  10:11 AM  Group Topic/Focus:  Goals Group:   The focus of this group is to help patients establish daily goals to achieve during treatment and discuss how the patient can incorporate goal setting into their daily lives to aide in recovery.    Participation Level:  Did Not Attend  Participation Quality:  NA       Affect:   NA  Cognitive:   NA  Insight: None  Engagement in Group:   NA  Modes of Intervention:   NA  Additional Comments:  NA  Beckie Busing 04/12/2023, 10:11 AM

## 2023-04-12 NOTE — Progress Notes (Addendum)
Pt denied SI/HI/AVH this morning. Pt has been pleasant, calm, and cooperative throughout the shift. RN provided support and encouragement to patient. Pt given scheduled medications as prescribed. Q15 min checks verified for safety. Patient verbally contracts for safety. Patient compliant with medications and treatment plan. Patient is interacting well on the unit. Pt is safe on the unit.   04/12/23 0900  Psych Admission Type (Psych Patients Only)  Admission Status Involuntary  Psychosocial Assessment  Patient Complaints Anxiety;Depression  Eye Contact Fair  Facial Expression Animated  Affect Sad  Speech Logical/coherent  Interaction Assertive  Motor Activity Slow  Appearance/Hygiene Unremarkable  Behavior Characteristics Cooperative;Appropriate to situation  Mood Anxious;Depressed  Thought Process  Coherency WDL  Content WDL  Delusions None reported or observed  Perception WDL  Hallucination None reported or observed  Judgment WDL  Confusion None  Danger to Self  Current suicidal ideation? Denies  Self-Injurious Behavior No self-injurious ideation or behavior indicators observed or expressed   Agreement Not to Harm Self Yes  Description of Agreement Verbal  Danger to Others  Danger to Others None reported or observed

## 2023-04-13 DIAGNOSIS — R45851 Suicidal ideations: Secondary | ICD-10-CM | POA: Diagnosis not present

## 2023-04-13 DIAGNOSIS — F250 Schizoaffective disorder, bipolar type: Secondary | ICD-10-CM | POA: Diagnosis not present

## 2023-04-13 DIAGNOSIS — E039 Hypothyroidism, unspecified: Secondary | ICD-10-CM | POA: Diagnosis not present

## 2023-04-13 DIAGNOSIS — Z5900 Homelessness unspecified: Secondary | ICD-10-CM | POA: Diagnosis not present

## 2023-04-13 DIAGNOSIS — F251 Schizoaffective disorder, depressive type: Secondary | ICD-10-CM | POA: Diagnosis not present

## 2023-04-13 MED ORDER — DOCUSATE SODIUM 100 MG PO CAPS
100.0000 mg | ORAL_CAPSULE | Freq: Every day | ORAL | Status: DC
  Administered 2023-04-13 – 2023-08-09 (×116): 100 mg via ORAL
  Filled 2023-04-13 (×74): qty 1
  Filled 2023-04-13: qty 7
  Filled 2023-04-13 (×50): qty 1

## 2023-04-13 NOTE — Progress Notes (Signed)
   04/13/23 0530  15 Minute Checks  Location Bedroom  Visual Appearance Calm  Behavior Sleeping  Sleep (Behavioral Health Patients Only)  Calculate sleep? (Click Yes once per 24 hr at 0600 safety check) Yes  Documented sleep last 24 hours 8

## 2023-04-13 NOTE — [facility] Group Notes (Signed)
[facility] Group Notes:  (Nursing/MHT/Case Management/Adjunct)  Date:  04/13/2023  Time:  8:50 PM  Type of Therapy:   Wrap-up group  Participation Level:  Active  Participation Quality:  Appropriate  Affect:  Appropriate  Cognitive:  Appropriate  Insight:  Appropriate  Engagement in Group:  Engaged  Modes of Intervention:  Education  Summary of Progress/Problems: Pt gol to see her daughter. Pt met goal. Pt rated her day 9/10.  Yolanda [name] 04/13/2023, 8:50 PM

## 2023-04-13 NOTE — Progress Notes (Signed)
Pt would like a visit with chaplain   04/13/23 2242  Psych Admission Type (Psych Patients Only)  Admission Status Involuntary  Psychosocial Assessment  Patient Complaints Anxiety  Eye Contact Fair  Facial Expression Animated  Affect Appropriate to circumstance  Speech Logical/coherent  Interaction Childlike  Motor Activity Slow  Appearance/Hygiene Unremarkable  Behavior Characteristics Appropriate to situation  Mood Depressed;Anxious  Thought Process  Coherency WDL  Content WDL  Delusions None reported or observed  Perception WDL  Hallucination None reported or observed  Judgment Limited  Confusion None  Danger to Self  Current suicidal ideation? Denies  Self-Injurious Behavior No self-injurious ideation or behavior indicators observed or expressed   Agreement Not to Harm Self Yes  Description of Agreement verbal  Danger to Others  Danger to Others None reported or observed

## 2023-04-13 NOTE — Group Note (Signed)
Date:  04/13/2023 Time:  10:12 AM  Group Topic/Focus:  Goals Group:   The focus of this group is to help patients establish daily goals to achieve during treatment and discuss how the patient can incorporate goal setting into their daily lives to aide in recovery.    Participation Level:  Did Not Attend  Participation Quality:  Appropriate and    Affect:      Cognitive:      Insight: None  Engagement in Group:    Modes of Intervention:      Additional Comments:     [name] 04/13/2023, 10:12 AM

## 2023-04-13 NOTE — Progress Notes (Signed)
[facility] MD Progress Note  04/13/2023 9:42 AM Yolanda [name]  MRN:  [id number]  Principal Problem: Schizoaffective disorder, depressive type (HCC)  Diagnosis: Principal Problem:   Schizoaffective disorder, depressive type (HCC) Active Problems:   GERD (gastroesophageal reflux disease)   Intellectual disability   Tobacco use disorder  Reason for admission: This is the first psychiatric admission in this [facility] in 12 years for this 47 AA female with an extensive hx of mental illnesses & probable polysubstance use disorders. She is admitted to the [facility] from the [facility] hospital with complain of worsening suicidal ideations with plan to stab herself. Per chart review, patient apparently reported at the ED that she has been depressed for a while & has not been taking her mental health medications. After medical evaluation.clearance, she was transferred to the [facility] for further psychiatric evaluation/treatments.   24 hr chart review: Is within normal limits, continues to take medications as scheduled, Nicorette gum, Tylenol & MOM are only PRN medications given in the past 24 hours, no agitation protocol medications administered in past 24 hours.  Not engaging with peers or attending unit group sessions. Isolating in her room. No behavioral concerns noted from nursing. Slept 8 hrs last night as per nursing documentation.  Patient assessment note: Pt continues to present with a flat affect and depressed mood, attention to personal hygiene and grooming remains poor, eye contact is minimal, speech is garbled, but comprehensible, and she speaks at a low tone which requires positive reinforcements to speak louder. Thought contents are organized and logical, and pt currently denies SI/HI/AVH or paranoia. There is no evidence of delusional thoughts.    She reports a good sleep quality last night, reports a good appetite, denies medication related side effects, denies having any issues with her bowels, states that  she did not go for breakfast, but states that she is not a breakfast person. She is eating lunch, dinner as well as snacks in between. Patient continues to air her frustrations at still remaining hospitalized, and we are continue to provide her with support and active listening as we continue to coordinate with CSW & DSS for a safe placement outside of the hospital environment. We will continue medications & add daily Colace as listed below, and we will continue to provide support for patient, and continue to positively reinforced for her to get out of her room and engage in unit activities. No TD/EPS type symptoms found on assessment, and pt denies any feelings of stiffness. AIMS: 0. Ordering a new EKG in the context of antipsychotic medications for Qtc tracing since last EKG was on 06/27.  Total Time spent with patient:  45 minutes  Past Psychiatric History:  See H & P  Past Medical History:  Past Medical History:  Diagnosis Date   Bipolar affect, depressed (HCC)    Constipation 08/17/2022   Depression    Falls 07/21/2022   Fracture of femoral neck, right, closed (HCC) 01/17/2022   Herpes simplex 08/22/2017   Open fracture dislocation of right elbow joint 01/17/2022    Past Surgical History:  Procedure Laterality Date   NO PAST SURGERIES     SALPINGECTOMY     Family History: History reviewed. No pertinent family history.  Family Psychiatric  History: See H & P  Social History:  Social History   Substance and Sexual Activity  Alcohol Use Yes     Social History   Substance and Sexual Activity  Drug Use Yes   Types: Cocaine, Marijuana  Social History   Socioeconomic History   Marital status: Single    Spouse name: Not on file   Number of children: Not on file   Years of education: Not on file   Highest education level: Not on file  Occupational History   Not on file  Tobacco Use   Smoking status: Every Day   Smokeless tobacco: Not on file  Substance and Sexual  Activity   Alcohol use: Yes   Drug use: Yes    Types: Cocaine, Marijuana   Sexual activity: Yes  Other Topics Concern   Not on file  Social History Narrative   Not on file   Social Determinants of Health   Financial Resource Strain: Low Risk  (09/12/2022)   Received from [facility], [facility]   Overall Financial Resource Strain (CARDIA)    Difficulty of Paying Living Expenses: Not hard at all  Food Insecurity: Patient Declined (12/20/2022)   Hunger Vital Sign    Worried About Running Out of Food in the Last Year: Patient declined    Ran Out of Food in the Last Year: Patient declined  Transportation Needs: No Transportation Needs (12/20/2022)   PRAPARE - Administrator, Civil Service (Medical): No    Lack of Transportation (Non-Medical): No  Physical Activity: Not on file  Stress: No Stress Concern Present (07/17/2022)   Received from [facility], [facility] - Occupational Stress Questionnaire    Feeling of Stress : Not at all  Social Connections: Unknown (07/16/2022)   Received from [facility], [facility]   Social Network    Social Network: Not on file   Sleep: Good  Appetite:  Good  Current Medications: Current Facility-Administered Medications  Medication Dose Route Frequency Provider Last Rate Last Admin   acetaminophen (TYLENOL) tablet 650 mg  650 mg Oral Q6H PRN [name], NP   650 mg at 04/13/23 0811   alum & mag hydroxide-simeth (MAALOX/MYLANTA) 200-200-20 MG/5ML suspension 30 mL  30 mL Oral Q4H PRN [name], NP   30 mL at 01/28/23 1530   busPIRone (BUSPAR) tablet 15 mg  15 mg Oral BID [name], NP   15 mg at 04/13/23 1610   cyanocobalamin (VITAMIN B12) injection 1,000 mcg  1,000 mcg Intramuscular Q30 days Sarita Bottom, MD   1,000 mcg at 04/03/23 1237   diphenhydrAMINE (BENADRYL) capsule 50 mg  50 mg Oral TID PRN [name], NP   50 mg at 01/15/23 1211   Or   diphenhydrAMINE  (BENADRYL) injection 50 mg  50 mg Intramuscular TID PRN [name], NP       feeding supplement (ENSURE ENLIVE / ENSURE PLUS) liquid 237 mL  237 mL Oral TID BM Damyen Knoll, NP   237 mL at 04/13/23 0941   haloperidol (HALDOL) tablet 5 mg  5 mg Oral TID PRN [name], NP   5 mg at 01/15/23 1211   Or   haloperidol lactate (HALDOL) injection 5 mg  5 mg Intramuscular TID PRN [name], NP       hydrOXYzine (ATARAX) tablet 25 mg  25 mg Oral TID PRN [name], DO   25 mg at 04/02/23 2041   LORazepam (ATIVAN) tablet 2 mg  2 mg Oral TID PRN [name], NP   2 mg at 01/15/23 1211   Or   LORazepam (ATIVAN) injection 2 mg  2 mg Intramuscular TID PRN [name], NP  magnesium hydroxide (MILK OF MAGNESIA) suspension 30 mL  30 mL Oral Daily PRN [name], NP   30 mL at 04/13/23 0814   melatonin tablet 5 mg  5 mg Oral QHS [name], NP   5 mg at 04/12/23 2108   nicotine (NICODERM CQ - dosed in mg/24 hours) patch 21 mg  21 mg Transdermal Daily [name], DO   21 mg at 03/25/23 1610   nicotine polacrilex (NICORETTE) gum 2 mg  2 mg Oral PRN [name], MD   2 mg at 04/13/23 0811   paliperidone (INVEGA) 24 hr tablet 6 mg  6 mg Oral Daily [name], NP   6 mg at 04/12/23 2108   pantoprazole (PROTONIX) EC tablet 40 mg  40 mg Oral Daily [name], NP   40 mg at 04/13/23 0811   polyethylene glycol (MIRALAX / GLYCOLAX) packet 17 g  17 g Oral Daily PRN [name], NP   17 g at 04/03/23 1829   sertraline (ZOLOFT) tablet 150 mg  150 mg Oral Daily Massengill, Harrold Donath, MD   150 mg at 04/13/23 9604   SUMAtriptan (IMITREX) tablet 25 mg  25 mg Oral BID PRN [name], NP   25 mg at 04/04/23 1832   traZODone (DESYREL) tablet 50 mg  50 mg Oral QHS [name], NP   50 mg at 04/12/23 2108   Vitamin D (Ergocalciferol) (DRISDOL) 1.25 MG (50000 UNIT) capsule 50,000 Units  50,000 Units Oral Q7 days [name], NP   50,000 Units at 04/06/23 1725   Lab Results:   Results for orders placed or performed during the hospital encounter of 12/20/22 (from the past 48 hour(s))  CBC with Differential/Platelet     Status: None   Collection Time: 04/11/23  6:32 PM  Result Value Ref Range   WBC 6.0 4.0 - 10.5 K/uL   RBC 4.29 3.87 - 5.11 MIL/uL   Hemoglobin 12.1 12.0 - 15.0 g/dL   HCT 54.0 98.1 - 19.1 %   MCV 92.1 80.0 - 100.0 fL   MCH 28.2 26.0 - 34.0 pg   MCHC 30.6 30.0 - 36.0 g/dL   RDW 47.8 29.5 - 62.1 %   Platelets 202 150 - 400 K/uL   nRBC 0.0 0.0 - 0.2 %   Neutrophils Relative % 50 %   Neutro Abs 3.0 1.7 - 7.7 K/uL   Lymphocytes Relative 37 %   Lymphs Abs 2.2 0.7 - 4.0 K/uL   Monocytes Relative 8 %   Monocytes Absolute 0.5 0.1 - 1.0 K/uL   Eosinophils Relative 4 %   Eosinophils Absolute 0.3 0.0 - 0.5 K/uL   Basophils Relative 1 %   Basophils Absolute 0.0 0.0 - 0.1 K/uL   Immature Granulocytes 0 %   Abs Immature Granulocytes 0.01 0.00 - 0.07 K/uL    Comment: Performed at [facility], [address], [place], [place] [address]  Comprehensive metabolic panel     Status: Abnormal   Collection Time: 04/11/23  6:32 PM  Result Value Ref Range   Sodium 134 (L) 135 - 145 mmol/L   Potassium 4.0 3.5 - 5.1 mmol/L   Chloride 100 98 - 111 mmol/L   CO2 25 22 - 32 mmol/L   Glucose, Bld 104 (H) 70 - 99 mg/dL    Comment: Glucose reference range applies only to samples taken after fasting for at least 8 hours.   BUN 16 6 - 20 mg/dL   Creatinine, Ser 7.84 0.44 -  1.00 mg/dL   Calcium 8.8 (L) 8.9 - 10.3 mg/dL   Total Protein 7.8 6.5 - 8.1 g/dL   Albumin 3.8 3.5 - 5.0 g/dL   AST 26 15 - 41 U/L   ALT 16 0 - 44 U/L   Alkaline Phosphatase 97 38 - 126 U/L   Total Bilirubin 0.4 0.3 - 1.2 mg/dL   GFR, Estimated >16 >10 mL/min    Comment: (NOTE) Calculated using the CKD-EPI Creatinine Equation (2021)    Anion gap 9 5 - 15    Comment: Performed at [facility], [address] [place], [place] [address]     Blood  Alcohol level:  Lab Results  Component Value Date   [facility] <10 12/19/2022   ETH <10 11/21/2019   Metabolic Disorder Labs: Lab Results  Component Value Date   HGBA1C 4.4 (L) 12/21/2022   MPG 79.58 12/21/2022   No results found for: "PROLACTIN" Lab Results  Component Value Date   CHOL 218 (H) 12/21/2022   TRIG 81 12/21/2022   HDL 53 12/21/2022   CHOLHDL 4.1 12/21/2022   VLDL 16 12/21/2022   LDLCALC 149 (H) 12/21/2022   LDLCALC 110 (H) 03/13/2011   Physical Findings: AIMS: Facial and Oral Movements Muscles of Facial Expression: None, normal Lips and Perioral Area: None, normal Jaw: None, normal Tongue: None, normal,Extremity Movements Upper (arms, wrists, hands, fingers): None, normal Lower (legs, knees, ankles, toes): None, normal, Trunk Movements Neck, shoulders, hips: None, normal, Overall Severity Severity of abnormal movements (highest score from questions above): None, normal Incapacitation due to abnormal movements: None, normal Patient's awareness of abnormal movements (rate only patient's report): No Awareness, Dental Status Current problems with teeth and/or dentures?: No Does patient usually wear dentures?: No  CIWA:    COWS:    AIMS:0 Musculoskeletal: Strength & Muscle Tone: within normal limits Gait & Station: normal Patient leans: N/A  Psychiatric Specialty Exam:  Presentation  General Appearance:  Disheveled  Eye Contact: Limited to baseline Speech: Decreased amount, decreased tone and volume but at baseline Speech Volume: Decreased  Handedness: Right  Mood and Affect  Mood: Sad and depressed mainly secondary to being in the hospital with no place to go Affect: Flat  Thought Process  Thought Processes: Coherent  Descriptions of Associations:Intact  Orientation:Partial  Thought Content:Logical [place] History of Schizophrenia/Schizoaffective disorder:Yes  Duration of Psychotic Symptoms:Greater than six  months  Hallucinations:Hallucinations: None    Ideas of Reference: None  Suicidal Thoughts:Suicidal Thoughts: No    Homicidal Thoughts:Homicidal Thoughts: No   Sensorium  Memory: Immediate Fair; Recent Poor; Remote Poor  Judgment: Fair  Insight: Poor  Executive Functions  Concentration: Fair  Attention Span: Fair  Recall: Poor  Fund of Knowledge: Poor  Language: Fair  Psychomotor Activity  Psychomotor Activity: Psychomotor Activity: Normal    Assets  Assets: Resilience  Sleep  Sleep: Sleep: Fair    Physical Exam: Physical Exam Vitals and nursing note reviewed.  HENT:     Nose: Nose normal.     Mouth/Throat:     Pharynx: Oropharynx is clear.  Eyes:     Pupils: Pupils are equal, round, and reactive to light.  Cardiovascular:     Rate and Rhythm: Normal rate.     Pulses: Normal pulses.  Pulmonary:     Effort: Pulmonary effort is normal.  Genitourinary:    Comments: Deferred Musculoskeletal:        General: Normal range of motion.     Cervical back: Normal range of  motion.  Skin:    General: Skin is warm and dry.  Neurological:     General: No focal deficit present.     Mental Status: She is alert and oriented to person, place, and time.  Psychiatric:        Mood and Affect: Mood normal.        Behavior: Behavior normal.        Thought Content: Thought content normal.    Review of Systems  Constitutional:  Negative for chills, diaphoresis and fever.  HENT:  Negative for congestion, hearing loss and sore throat.   Eyes:  Negative for blurred vision.  Respiratory:  Negative for cough, shortness of breath and wheezing.   Cardiovascular:  Negative for chest pain and palpitations.  Gastrointestinal:  Negative for abdominal pain, constipation, diarrhea, heartburn, nausea and vomiting.  Genitourinary:  Negative for dysuria.  Musculoskeletal:  Negative for joint pain and myalgias.  Skin:  Negative for rash.  Neurological:  Negative  for dizziness.  Psychiatric/Behavioral:  Positive for depression. Negative for hallucinations, memory loss, substance abuse and suicidal ideas. The patient is nervous/anxious and has insomnia.   All other systems reviewed and are negative.  Blood pressure 93/62, pulse 79, temperature 98.3 F (36.8 C), temperature source Oral, resp. rate 16, height 4\' 11"  (1.499 m), weight 63 kg, SpO2 99%. Body mass index is 28.05 kg/m.  Treatment Plan Summary: Daily contact with patient to assess and evaluate symptoms and progress in treatment and Medication management.    Still waiting on safe disposition as patient would not be able to live independently given cognitive impairment. Recommend assisted living.    No medication side effects reported.  Because of urinary incontinence, recommended adult diapers to be worn.   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  Plan:  -Continue Imitrex 25 mg PRN BID for migraines -Start Colace 100 mg daily for constipation -Continue Vitamin D 50.000 units weekly for bone health. -Continue Sertraline150 mg po Q daily for depression/anxiety -Continue Buspar 15 mg po bid for anxiety.  -Continue paliperidone 6 mg po qd for mood stabilization -Continue Trazodone 50 mg nightly for insomnia  -Continue Nicoderm 21 mg topically Q 24 hrs for nicotine withdrawal management. -Continue vitamin B12 1000 mcg IM weekly for 4 weeks then monthly -Continue Melatonin 5 mg nightly for sleep -Continue Protonix EC 40 mg p.o. daily for GERD -Continue MiraLAX 17 g p.o. PRN for constipation -Continue hydroxyzine 25 mg p.o. 3 times daily as needed for anxiety -Continue Ensure nutritional shakes TID in between meals  -Previously discontinued Hydroxyzine  50 mg nightly-hypotension in the mornings   Safety and Monitoring: Voluntary admission to inpatient psychiatric unit for safety, stabilization and treatment Daily contact with patient to assess and evaluate symptoms and progress in treatment Patient's case to be discussed in multi-disciplinary team meeting Observation Level : q15 minute checks Vital signs: q12 hours Precautions: Safety   Discharge Planning: Social work and case management to assist with discharge planning and identification of hospital follow-up needs prior to discharge Estimated LOS: Unknown at this time. Discharge Concerns: Need to establish a safety plan; Medication compliance and effectiveness Discharge Goals: Return home with outpatient referrals for mental health follow-up including medication management/psychotherapy No legal guardian, has payee  [name], NP,  04/13/2023, 9:42 AM Patient ID: Yolanda [name], female   DOB: [birth year],  47 y.o.   MRN: [id number]

## 2023-04-13 NOTE — Progress Notes (Signed)
   04/13/23 1100  Psych Admission Type (Psych Patients Only)  Admission Status Involuntary  Psychosocial Assessment  Patient Complaints Anxiety;Depression  Eye Contact Fair  Facial Expression Animated  Affect Appropriate to circumstance  Speech Logical/coherent  Interaction Assertive  Motor Activity Slow  Appearance/Hygiene Unremarkable  Behavior Characteristics Appropriate to situation  Mood Depressed;Anxious  Thought Process  Coherency WDL  Content WDL  Delusions None reported or observed  Perception WDL  Hallucination None reported or observed  Judgment Limited  Confusion None  Danger to Self  Current suicidal ideation? Denies  Self-Injurious Behavior No self-injurious ideation or behavior indicators observed or expressed   Agreement Not to Harm Self Yes  Description of Agreement verbal  Danger to Others  Danger to Others None reported or observed

## 2023-04-13 NOTE — Group Note (Signed)
Date:  04/13/2023 Time:  5:08 PM  Group Topic/Focus:  Wellness Toolbox:   The focus of this group is to discuss various aspects of wellness, balancing those aspects and exploring ways to increase the ability to experience wellness.  Patients will create a wellness toolbox for use upon discharge.    Participation Level:  Minimal  Participation Quality:  Inattentive  Affect:  Blunted  Cognitive:  Lacking  Insight: Limited  Engagement in Group:  Limited  Modes of Intervention:  Exploration  Additional Comments:     Yolanda [name] 04/13/2023, 5:08 PM

## 2023-04-14 DIAGNOSIS — F251 Schizoaffective disorder, depressive type: Secondary | ICD-10-CM | POA: Diagnosis not present

## 2023-04-14 NOTE — Progress Notes (Addendum)
Patient noted to be in bed most of shift even with encouragement from staff to attend group therapy and come out into the milieu. Medication compliant, no interaction observed with peers.  Patient denies SI/HI and AVH. Safety maintained.  04/14/23 0815  Psych Admission Type (Psych Patients Only)  Admission Status Involuntary  Psychosocial Assessment  Patient Complaints Depression;Anxiety  Eye Contact Fair  Facial Expression Animated  Affect Appropriate to circumstance  Speech Logical/coherent  Interaction Childlike  Motor Activity Slow  Appearance/Hygiene Unremarkable  Behavior Characteristics Appropriate to situation  Mood Depressed;Anxious  Thought Process  Coherency WDL  Content WDL  Delusions None reported or observed  Perception WDL  Hallucination None reported or observed  Judgment Limited  Confusion None  Danger to Self  Current suicidal ideation? Denies  Self-Injurious Behavior No self-injurious ideation or behavior indicators observed or expressed   Agreement Not to Harm Self Yes  Description of Agreement Verbal  Danger to Others  Danger to Others None reported or observed

## 2023-04-14 NOTE — Plan of Care (Signed)
  Problem: Activity: Goal: Interest or engagement in leisure activities will improve Outcome: Progressing Goal: Imbalance in normal sleep/wake cycle will improve Outcome: Progressing   Problem: Coping: Goal: Coping ability will improve Outcome: Progressing Goal: Will verbalize feelings Outcome: Progressing   Problem: Education: Goal: Utilization of techniques to improve thought processes will improve Outcome: Progressing Goal: Knowledge of the prescribed therapeutic regimen will improve Outcome: Progressing   Problem: Activity: Goal: Interest or engagement in leisure activities will improve Outcome: Progressing Goal: Imbalance in normal sleep/wake cycle will improve Outcome: Progressing   Problem: Medication: Goal: Compliance with prescribed medication regimen will improve Outcome: Progressing   Problem: Safety: Goal: Periods of time without injury will increase Outcome: Progressing

## 2023-04-14 NOTE — Progress Notes (Signed)
   04/14/23 0530  15 Minute Checks  Location Bedroom  Visual Appearance Calm  Behavior Sleeping  Sleep (Behavioral Health Patients Only)  Calculate sleep? (Click Yes once per 24 hr at 0600 safety check) Yes  Documented sleep last 24 hours 7.5

## 2023-04-14 NOTE — Progress Notes (Signed)
   04/14/23 2127  Psych Admission Type (Psych Patients Only)  Admission Status Involuntary  Psychosocial Assessment  Patient Complaints Anxiety  Eye Contact Fair  Facial Expression Animated  Affect Appropriate to circumstance  Speech Logical/coherent  Interaction Childlike  Motor Activity Slow  Appearance/Hygiene Unremarkable  Behavior Characteristics Appropriate to situation  Mood Pleasant  Thought Process  Coherency WDL  Content WDL  Delusions None reported or observed  Perception WDL  Hallucination None reported or observed  Judgment Limited  Confusion None  Danger to Self  Current suicidal ideation? Denies  Self-Injurious Behavior No self-injurious ideation or behavior indicators observed or expressed   Agreement Not to Harm Self Yes  Description of Agreement verbal  Danger to Others  Danger to Others None reported or observed

## 2023-04-14 NOTE — Plan of Care (Signed)
  Problem: Coping: Goal: Coping ability will improve Outcome: Progressing   Problem: Coping: Goal: Will verbalize feelings Outcome: Progressing   

## 2023-04-14 NOTE — Progress Notes (Signed)
[facility] MD Progress Note  04/14/2023 2:51 PM Yolanda [name]  MRN:  [id number]  Principal Problem: Schizoaffective disorder, depressive type (HCC)  Diagnosis: Principal Problem:   Schizoaffective disorder, depressive type (HCC) Active Problems:   GERD (gastroesophageal reflux disease)   Intellectual disability   Tobacco use disorder  Reason for admission: This is the first psychiatric admission in this [facility] in 12 years for this 47 AA female with an extensive hx of mental illnesses & probable polysubstance use disorders. She is admitted to the [facility] from the [facility] hospital with complain of worsening suicidal ideations with plan to stab herself. Per chart review, patient apparently reported at the ED that she has been depressed for a while & has not been taking her mental health medications. After medical evaluation.clearance, she was transferred to the [facility] for further psychiatric evaluation/treatments.   24 hr chart review: V/S within normal limits, continues to take medications as scheduled, Nicorette gum, and no PRN medications given in the past 24 hours, no agitation protocol medications administered in past 24 hours.  Not engaging with peers or attending unit group sessions. Continues to be Isolating in her room with the exception of during lunch and dinner. No behavioral concerns noted from nursing. Slept 8 hrs last night as per nursing documentation.  Patient assessment note: Chart reviewed, case discussed with treatment team. [name] attention to personal hygiene and grooming today remains, poor, staff continues to provide positive reinforcements for pt to engage in personal hygiene needs. Eye contact is poor, speech is garbled, but coherent. Mood is dysphoric, affect is congruent. Pt reports a good sleep quality last night, reports a good appetite, denies SI/HI/AVH, and denies any side effects to her medications.  Pt denies any abnormal movements of their extremities, and is visible ambulating  with a steady gait.  Q15 minute checks are being maintained for safety. Positive reinforcements are continuing to be given for patient to stop isolating in her room and to engage in unit activities and group sessions.   No TD/EPS type symptoms found on assessment, and pt denies any feelings of stiffness. AIMS: 0. New EKG in the context of antipsychotic medications for Qtc WNL. Pt reports a good sleep quality last night, reports a fair appetite, denies being in any physical pain. We continue to coordinate with DSS for a safe placement in the community, and pt is continuing to require to be hospitalized until such a placement is found.  Total Time spent with patient:  45 minutes  Past Psychiatric History:  See H & P  Past Medical History:  Past Medical History:  Diagnosis Date   Bipolar affect, depressed (HCC)    Constipation 08/17/2022   Depression    Falls 07/21/2022   Fracture of femoral neck, right, closed (HCC) 01/17/2022   Herpes simplex 08/22/2017   Open fracture dislocation of right elbow joint 01/17/2022    Past Surgical History:  Procedure Laterality Date   NO PAST SURGERIES     SALPINGECTOMY     Family History: History reviewed. No pertinent family history.  Family Psychiatric  History: See H & P  Social History:  Social History   Substance and Sexual Activity  Alcohol Use Yes     Social History   Substance and Sexual Activity  Drug Use Yes   Types: Cocaine, Marijuana    Social History   Socioeconomic History   Marital status: Single    Spouse name: Not on file   Number of children: Not on  file   Years of education: Not on file   Highest education level: Not on file  Occupational History   Not on file  Tobacco Use   Smoking status: Every Day   Smokeless tobacco: Not on file  Substance and Sexual Activity   Alcohol use: Yes   Drug use: Yes    Types: Cocaine, Marijuana   Sexual activity: Yes  Other Topics Concern   Not on file  Social History  Narrative   Not on file   Social Determinants of Health   Financial Resource Strain: Low Risk  (09/12/2022)   Received from [facility], [facility]   Overall Financial Resource Strain (CARDIA)    Difficulty of Paying Living Expenses: Not hard at all  Food Insecurity: Patient Declined (12/20/2022)   Hunger Vital Sign    Worried About Running Out of Food in the Last Year: Patient declined    Ran Out of Food in the Last Year: Patient declined  Transportation Needs: No Transportation Needs (12/20/2022)   PRAPARE - Administrator, Civil Service (Medical): No    Lack of Transportation (Non-Medical): No  Physical Activity: Not on file  Stress: No Stress Concern Present (07/17/2022)   Received from [facility], [facility] of Occupational Health - Occupational Stress Questionnaire    Feeling of Stress : Not at all  Social Connections: Unknown (07/16/2022)   Received from [facility], [facility]   Social Network    Social Network: Not on file   Sleep: Good  Appetite:  Good  Current Medications: Current Facility-Administered Medications  Medication Dose Route Frequency Provider Last Rate Last Admin   acetaminophen (TYLENOL) tablet 650 mg  650 mg Oral Q6H PRN [name], NP   650 mg at 04/13/23 0811   alum & mag hydroxide-simeth (MAALOX/MYLANTA) 200-200-20 MG/5ML suspension 30 mL  30 mL Oral Q4H PRN [name], NP   30 mL at 01/28/23 1530   busPIRone (BUSPAR) tablet 15 mg  15 mg Oral BID [name], NP   15 mg at 04/14/23 0800   cyanocobalamin (VITAMIN B12) injection 1,000 mcg  1,000 mcg Intramuscular Q30 days Sarita Bottom, MD   1,000 mcg at 04/03/23 1237   diphenhydrAMINE (BENADRYL) capsule 50 mg  50 mg Oral TID PRN [name], NP   50 mg at 01/15/23 1211   Or   diphenhydrAMINE (BENADRYL) injection 50 mg  50 mg Intramuscular TID PRN [name], NP       docusate sodium (COLACE) capsule 100 mg  100 mg Oral Daily Alayjah Boehringer, NP    100 mg at 04/14/23 0800   feeding supplement (ENSURE ENLIVE / ENSURE PLUS) liquid 237 mL  237 mL Oral TID BM Ai Sonnenfeld, NP   237 mL at 04/14/23 1007   haloperidol (HALDOL) tablet 5 mg  5 mg Oral TID PRN [name], NP   5 mg at 01/15/23 1211   Or   haloperidol lactate (HALDOL) injection 5 mg  5 mg Intramuscular TID PRN [name], NP       hydrOXYzine (ATARAX) tablet 25 mg  25 mg Oral TID PRN [name], DO   25 mg at 04/02/23 2041   LORazepam (ATIVAN) tablet 2 mg  2 mg Oral TID PRN [name], NP   2 mg at 01/15/23 1211   Or   LORazepam (ATIVAN) injection 2 mg  2 mg Intramuscular TID PRN [name], NP  magnesium hydroxide (MILK OF MAGNESIA) suspension 30 mL  30 mL Oral Daily PRN [name], NP   30 mL at 04/13/23 0814   melatonin tablet 5 mg  5 mg Oral QHS Mark Benecke, NP   5 mg at 04/13/23 2055   nicotine (NICODERM CQ - dosed in mg/24 hours) patch 21 mg  21 mg Transdermal Daily [name], DO   21 mg at 03/25/23 0865   nicotine polacrilex (NICORETTE) gum 2 mg  2 mg Oral PRN [name], MD   2 mg at 04/13/23 1611   paliperidone (INVEGA) 24 hr tablet 6 mg  6 mg Oral Daily [name], NP   6 mg at 04/13/23 2055   pantoprazole (PROTONIX) EC tablet 40 mg  40 mg Oral Daily Nwoko, Nicole Kindred I, NP   40 mg at 04/14/23 0800   polyethylene glycol (MIRALAX / GLYCOLAX) packet 17 g  17 g Oral Daily PRN [name], NP   17 g at 04/13/23 1734   sertraline (ZOLOFT) tablet 150 mg  150 mg Oral Daily Massengill, Harrold Donath, MD   150 mg at 04/14/23 0800   SUMAtriptan (IMITREX) tablet 25 mg  25 mg Oral BID PRN [name], NP   25 mg at 04/13/23 1243   traZODone (DESYREL) tablet 50 mg  50 mg Oral QHS [name], NP   50 mg at 04/13/23 2055   Vitamin D (Ergocalciferol) (DRISDOL) 1.25 MG (50000 UNIT) capsule 50,000 Units  50,000 Units Oral Q7 days [name], NP   50,000 Units at 04/13/23 1410   Lab Results:  No results found for this or any previous visit (from  the past 48 hour(s)).    Blood Alcohol level:  Lab Results  Component Value Date   ETH <10 12/19/2022   ETH <10 11/21/2019   Metabolic Disorder Labs: Lab Results  Component Value Date   HGBA1C 4.4 (L) 12/21/2022   MPG 79.58 12/21/2022   No results found for: "PROLACTIN" Lab Results  Component Value Date   CHOL 218 (H) 12/21/2022   TRIG 81 12/21/2022   HDL 53 12/21/2022   CHOLHDL 4.1 12/21/2022   VLDL 16 12/21/2022   LDLCALC 149 (H) 12/21/2022   LDLCALC 110 (H) 03/13/2011   Physical Findings: AIMS: Facial and Oral Movements Muscles of Facial Expression: None, normal Lips and Perioral Area: None, normal Jaw: None, normal Tongue: None, normal,Extremity Movements Upper (arms, wrists, hands, fingers): None, normal Lower (legs, knees, ankles, toes): None, normal, Trunk Movements Neck, shoulders, hips: None, normal, Overall Severity Severity of abnormal movements (highest score from questions above): None, normal Incapacitation due to abnormal movements: None, normal Patient's awareness of abnormal movements (rate only patient's report): No Awareness, Dental Status Current problems with teeth and/or dentures?: No Does patient usually wear dentures?: No  CIWA:    COWS:    AIMS:0 Musculoskeletal: Strength & Muscle Tone: within normal limits Gait & Station: normal Patient leans: N/A  Psychiatric Specialty Exam:  Presentation  General Appearance:  Appropriate for Environment; Casual  Eye Contact: Limited to baseline Speech: Decreased amount, decreased tone and volume but at baseline Speech Volume: Normal  Handedness: Right  Mood and Affect  Mood: Sad and depressed mainly secondary to being in the hospital with no place to go Affect: Congruent  Thought Process  Thought Processes: Coherent  Descriptions of Associations:Intact  Orientation:Partial  Thought Content:Logical Concrete History of Schizophrenia/Schizoaffective disorder:Yes  Duration of  Psychotic Symptoms:Greater than six months  Hallucinations:Hallucinations: None    Ideas of Reference:  None  Suicidal Thoughts:Suicidal Thoughts: No    Homicidal Thoughts:Homicidal Thoughts: No   Sensorium  Memory: Immediate Poor  Judgment: Fair  Insight: Poor  Executive Functions  Concentration: Fair  Attention Span: Poor  Recall: Poor  Fund of Knowledge: Poor  Language: Poor  Psychomotor Activity  Psychomotor Activity: Psychomotor Activity: Normal    Assets  Assets: Resilience  Sleep  Sleep: Sleep: Good    Physical Exam: Physical Exam Vitals and nursing note reviewed.  HENT:     Nose: Nose normal.     Mouth/Throat:     Pharynx: Oropharynx is clear.  Eyes:     Pupils: Pupils are equal, round, and reactive to light.  Cardiovascular:     Rate and Rhythm: Normal rate.     Pulses: Normal pulses.  Pulmonary:     Effort: Pulmonary effort is normal.  Genitourinary:    Comments: Deferred Musculoskeletal:        General: Normal range of motion.     Cervical back: Normal range of motion.  Skin:    General: Skin is warm and dry.  Neurological:     General: No focal deficit present.     Mental Status: She is alert and oriented to person, place, and time.  Psychiatric:        Mood and Affect: Mood normal.        Behavior: Behavior normal.        Thought Content: Thought content normal.    Review of Systems  Constitutional:  Negative for chills, diaphoresis and fever.  HENT:  Negative for congestion, hearing loss and sore throat.   Eyes:  Negative for blurred vision.  Respiratory:  Negative for cough, shortness of breath and wheezing.   Cardiovascular:  Negative for chest pain and palpitations.  Gastrointestinal:  Negative for abdominal pain, constipation, diarrhea, heartburn, nausea and vomiting.  Genitourinary:  Negative for dysuria.  Musculoskeletal:  Negative for joint pain and myalgias.  Skin:  Negative for rash.  Neurological:   Negative for dizziness.  Psychiatric/Behavioral:  Positive for depression. Negative for hallucinations, memory loss, substance abuse and suicidal ideas. The patient is nervous/anxious and has insomnia.   All other systems reviewed and are negative.  Blood pressure 102/67, pulse 71, temperature 98.3 F (36.8 C), temperature source Oral, resp. rate 16, height 4\' 11"  (1.499 m), weight 63 kg, SpO2 98%. Body mass index is 28.05 kg/m.  Treatment Plan Summary: Daily contact with patient to assess and evaluate symptoms and progress in treatment and Medication management.    Still waiting on safe disposition as patient would not be able to live independently given cognitive impairment. Recommend assisted living.    No medication side effects reported.  Because of urinary incontinence, recommended adult diapers to be worn.   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  Plan:  -Continue Imitrex 25 mg PRN BID for migraines -Continue Colace 100 mg daily for constipation -Continue Vitamin D 50.000 units weekly for bone health. -Continue Sertraline150 mg po Q daily for depression/anxiety -Continue Buspar 15 mg po bid for anxiety.  -Continue paliperidone 6 mg po qd for mood stabilization -Continue Trazodone 50 mg nightly for insomnia  -Continue Nicoderm 21 mg topically Q 24 hrs for nicotine withdrawal management. -Continue vitamin B12 1000 mcg IM weekly for 4 weeks then monthly -Continue Melatonin 5 mg nightly for sleep -Continue  Protonix EC 40 mg p.o. daily for GERD -Continue MiraLAX 17 g p.o. PRN for constipation -Continue hydroxyzine 25 mg p.o. 3 times daily as needed for anxiety -Continue Ensure nutritional shakes TID in between meals  -Previously discontinued  Hydroxyzine 50 mg nightly-hypotension in the mornings   Safety and Monitoring: Voluntary admission to inpatient psychiatric unit for safety, stabilization and treatment Daily contact with patient to assess and evaluate symptoms and progress in treatment Patient's case to be discussed in multi-disciplinary team meeting Observation Level : q15 minute checks Vital signs: q12 hours Precautions: Safety   Discharge Planning: Social work and case management to assist with discharge planning and identification of hospital follow-up needs prior to discharge Estimated LOS: Unknown at this time. Discharge Concerns: Need to establish a safety plan; Medication compliance and effectiveness Discharge Goals: Return home with outpatient referrals for mental health follow-up including medication management/psychotherapy No legal guardian, has payee  [name], NP,  04/14/2023, 2:52 PM Patient ID: Yolanda [name], female   DOB: 08/27/1975, 47 y.o.   MRN: [id number] Patient ID: Yolanda [name], female   DOB: 1976-06-29, 47 y.o.   MRN: [id number]

## 2023-04-14 NOTE — Group Note (Signed)
Date:  04/14/2023 Time:  2:03 PM  Group Topic/Focus:  Goals Group:   The focus of this group is to help patients establish daily goals to achieve during treatment and discuss how the patient can incorporate goal setting into their daily lives to aide in recovery.    Participation Level:  Did Not Attend  Participation Quality:      Affect:      Cognitive:      Insight: None  Engagement in Group:      Modes of Intervention:      Additional Comments:    Beckie Busing 04/14/2023, 2:03 PM

## 2023-04-14 NOTE — Group Note (Signed)
Date:  04/14/2023 Time:  10:59 PM  Group Topic/Focus:  Wrap-Up Group:   The focus of this group is to help patients review their daily goal of treatment and discuss progress on daily workbooks.    Participation Level:  Active  Participation Quality:  Appropriate and Sharing  Affect:  Appropriate  Cognitive:  Appropriate  Insight: Appropriate  Engagement in Group:  Engaged  Modes of Intervention:  Activity and Socialization  Additional Comments:  The patient stated that she had a good day. The patient shared that she has a meeting tomorrow and hopefully they find her somewhere to go. The patient also shared that she is having a visit tomorrow. The patient rated her day a 10/10. The patient participated in the group activity at the end of the group.   Yolanda [name] 04/14/2023, 10:59 PM

## 2023-04-14 NOTE — [facility] Group Notes (Signed)
[facility] Group Notes:  (Nursing)  Date:  04/14/2023  Time:  1330  Type of Therapy:  Nurse Education  Participation Level:  Did Not Attend

## 2023-04-14 NOTE — [facility] Group Notes (Signed)
Type of Therapy and Topic:  Group Therapy:Gratitude  Participation Level:  Did Not Attend   Description of Group:   In this group, patients shared and discussed the importance of acknowledging the elements in their lives for which they are grateful and how this can positively impact their mood.  The group discussed how bringing the positive elements of their lives to the forefront of their minds can help with recovery from any illness, physical or mental.  An exercise was done as a group in which a list was made of gratitude items in order to encourage participants to consider other potential positives in their lives.  Therapeutic Goals: Patients will identify one or more item for which they are grateful in each of 6 categories:  people, experiences, things, places, skills, and other. Patients will discuss how it is possible to seek out gratitude in even bad situations. Patients will explore other possible items of gratitude that they could remember.   Summary of Patient Progress: NA  Therapeutic Modalities:   Solution-Focused Therapy Activity

## 2023-04-15 ENCOUNTER — Encounter (HOSPITAL_COMMUNITY): Payer: Self-pay

## 2023-04-15 DIAGNOSIS — F251 Schizoaffective disorder, depressive type: Secondary | ICD-10-CM | POA: Diagnosis not present

## 2023-04-15 NOTE — BH IP Treatment Plan (Signed)
Interdisciplinary Treatment and Diagnostic Plan Update  04/15/2023 Time of Session: 10:05am (UPDATE) Yolanda [name] MRN: [id number]  Principal Diagnosis: Schizoaffective disorder, depressive type (HCC)  Secondary Diagnoses: Principal Problem:   Schizoaffective disorder, depressive type (HCC) Active Problems:   GERD (gastroesophageal reflux disease)   Intellectual disability   Tobacco use disorder   Current Medications:  Current Facility-Administered Medications  Medication Dose Route Frequency Provider Last Rate Last Admin   acetaminophen (TYLENOL) tablet 650 mg  650 mg Oral Q6H PRN [name], NP   650 mg at 04/13/23 0811   alum & mag hydroxide-simeth (MAALOX/MYLANTA) 200-200-20 MG/5ML suspension 30 mL  30 mL Oral Q4H PRN [name], NP   30 mL at 01/28/23 1530   busPIRone (BUSPAR) tablet 15 mg  15 mg Oral BID [name], NP   15 mg at 04/15/23 0810   cyanocobalamin (VITAMIN B12) injection 1,000 mcg  1,000 mcg Intramuscular Q30 days [name], Nadir, MD   1,000 mcg at 04/03/23 1237   diphenhydrAMINE (BENADRYL) capsule 50 mg  50 mg Oral TID PRN [name], NP   50 mg at 01/15/23 1211   Or   diphenhydrAMINE (BENADRYL) injection 50 mg  50 mg Intramuscular TID PRN [name], NP       docusate sodium (COLACE) capsule 100 mg  100 mg Oral Daily Nkwenti, Doris, NP   100 mg at 04/15/23 0811   feeding supplement (ENSURE ENLIVE / ENSURE PLUS) liquid 237 mL  237 mL Oral TID BM Nkwenti, Doris, NP   237 mL at 04/15/23 1050   haloperidol (HALDOL) tablet 5 mg  5 mg Oral TID PRN [name], NP   5 mg at 01/15/23 1211   Or   haloperidol lactate (HALDOL) injection 5 mg  5 mg Intramuscular TID PRN [name], NP       hydrOXYzine (ATARAX) tablet 25 mg  25 mg Oral TID PRN [name], DO   25 mg at 04/02/23 2041   LORazepam (ATIVAN) tablet 2 mg  2 mg Oral TID PRN [name], NP   2 mg at 01/15/23 1211   Or   LORazepam (ATIVAN) injection 2 mg  2 mg Intramuscular TID PRN  [name], NP       magnesium hydroxide (MILK OF MAGNESIA) suspension 30 mL  30 mL Oral Daily PRN [name], NP   30 mL at 04/13/23 0814   melatonin tablet 5 mg  5 mg Oral QHS Nkwenti, Doris, NP   5 mg at 04/14/23 2057   nicotine (NICODERM CQ - dosed in mg/24 hours) patch 21 mg  21 mg Transdermal Daily [name], DO   21 mg at 03/25/23 8657   nicotine polacrilex (NICORETTE) gum 2 mg  2 mg Oral PRN [name], MD   2 mg at 04/14/23 1830   paliperidone (INVEGA) 24 hr tablet 6 mg  6 mg Oral Daily Nkwenti, Tyler Aas, NP   6 mg at 04/14/23 2057   pantoprazole (PROTONIX) EC tablet 40 mg  40 mg Oral Daily Nwoko, Nicole Kindred I, NP   40 mg at 04/15/23 0810   polyethylene glycol (MIRALAX / GLYCOLAX) packet 17 g  17 g Oral Daily PRN [name], NP   17 g at 04/13/23 1734   sertraline (ZOLOFT) tablet 150 mg  150 mg Oral Daily Massengill, Harrold Donath, MD   150 mg at 04/15/23 0811   SUMAtriptan (IMITREX) tablet 25 mg  25 mg Oral BID PRN [name], NP   25  mg at 04/13/23 1243   traZODone (DESYREL) tablet 50 mg  50 mg Oral QHS [name], NP   50 mg at 04/14/23 2057   Vitamin D (Ergocalciferol) (DRISDOL) 1.25 MG (50000 UNIT) capsule 50,000 Units  50,000 Units Oral Q7 days [name], NP   50,000 Units at 04/13/23 1410   PTA Medications: Medications Prior to Admission  Medication Sig Dispense Refill Last Dose   busPIRone (BUSPAR) 15 MG tablet Take 15 mg by mouth 2 (two) times daily. (Patient not taking: Reported on 12/19/2022)      paliperidone (INVEGA SUSTENNA) 156 MG/ML SUSY injection Inject 156 mg into the muscle once. (Patient not taking: Reported on 12/19/2022)      sertraline (ZOLOFT) 50 MG tablet Take 150 mg by mouth daily. (Patient not taking: Reported on 12/19/2022)      traZODone (DESYREL) 100 MG tablet Take 100 mg by mouth at bedtime as needed for sleep. (Patient not taking: Reported on 11/12/2022)       Patient Stressors: Medication change or noncompliance    Patient Strengths:  Forensic psychologist fund of knowledge   Treatment Modalities: Medication Management, Group therapy, Case management,  1 to 1 session with clinician, Psychoeducation, Recreational therapy.   Physician Treatment Plan for Primary Diagnosis: Schizoaffective disorder, depressive type (HCC) Long Term Goal(s): Improvement in symptoms so as ready for discharge   Short Term Goals: Ability to identify and develop effective coping behaviors will improve Ability to maintain clinical measurements within normal limits will improve Compliance with prescribed medications will improve Ability to identify triggers associated with substance abuse/mental health issues will improve Ability to identify changes in lifestyle to reduce recurrence of condition will improve Ability to verbalize feelings will improve Ability to disclose and discuss suicidal ideas Ability to demonstrate self-control will improve  Medication Management: Evaluate patient's response, side effects, and tolerance of medication regimen.  Therapeutic Interventions: 1 to 1 sessions, Unit Group sessions and Medication administration.  Evaluation of Outcomes: Progressing  Physician Treatment Plan for Secondary Diagnosis: Principal Problem:   Schizoaffective disorder, depressive type (HCC) Active Problems:   GERD (gastroesophageal reflux disease)   Intellectual disability   Tobacco use disorder  Long Term Goal(s): Improvement in symptoms so as ready for discharge   Short Term Goals: Ability to identify and develop effective coping behaviors will improve Ability to maintain clinical measurements within normal limits will improve Compliance with prescribed medications will improve Ability to identify triggers associated with substance abuse/mental health issues will improve Ability to identify changes in lifestyle to reduce recurrence of condition will improve Ability to verbalize feelings will improve Ability to disclose  and discuss suicidal ideas Ability to demonstrate self-control will improve     Medication Management: Evaluate patient's response, side effects, and tolerance of medication regimen.  Therapeutic Interventions: 1 to 1 sessions, Unit Group sessions and Medication administration.  Evaluation of Outcomes: Progressing   RN Treatment Plan for Primary Diagnosis: Schizoaffective disorder, depressive type (HCC) Long Term Goal(s): Knowledge of disease and therapeutic regimen to maintain health will improve  Short Term Goals: Ability to remain free from injury will improve, Ability to verbalize frustration and anger appropriately will improve, Ability to participate in decision making will improve, Ability to verbalize feelings will improve, Ability to identify and develop effective coping behaviors will improve, and Compliance with prescribed medications will improve  Medication Management: RN will administer medications as ordered by provider, will assess and evaluate patient's response and provide education to patient for prescribed medication.  RN will report any adverse and/or side effects to prescribing provider.  Therapeutic Interventions: 1 on 1 counseling sessions, Psychoeducation, Medication administration, Evaluate responses to treatment, Monitor vital signs and CBGs as ordered, Perform/monitor CIWA, COWS, AIMS and Fall Risk screenings as ordered, Perform wound care treatments as ordered.  Evaluation of Outcomes: Progressing   LCSW Treatment Plan for Primary Diagnosis: Schizoaffective disorder, depressive type (HCC) Long Term Goal(s): Safe transition to appropriate next level of care at discharge, Engage patient in therapeutic group addressing interpersonal concerns.  Short Term Goals: Engage patient in aftercare planning with referrals and resources, Increase social support, Increase emotional regulation, Facilitate acceptance of mental health diagnosis and concerns, Identify triggers  associated with mental health/substance abuse issues, and Increase skills for wellness and recovery  Therapeutic Interventions: Assess for all discharge needs, 1 to 1 time with Social worker, Explore available resources and support systems, Assess for adequacy in community support network, Educate family and significant other(s) on suicide prevention, Complete Psychosocial Assessment, Interpersonal group therapy.  Evaluation of Outcomes: Progressing   Progress in Treatment: Attending groups: Yes. Participating in groups: Yes. Taking medication as prescribed: Yes. Toleration medication: Yes. Family/Significant other contact made: Yes, individual(s) contacted:  Tamera Reason Patient understands diagnosis: Yes. Discussing patient identified problems/goals with staff: Yes. Medical problems stabilized or resolved: Yes. Denies suicidal/homicidal ideation: Yes. Issues/concerns per patient self-inventory: Yes. Other: N/A   New problem(s) identified: No, Describe:  None Reported   New Short Term/Long Term Goal(s):stabilization, elimination of SI thoughts, development of comprehensive mental wellness plan.  medication    Patient Goals:  Coping Skills/Housing   Discharge Plan or Barriers: CSW will continue to follow and assess for appropriate referrals and possible discharge planning.    Reason for Continuation of Hospitalization: Depression Medication stabilization Suicidal ideation   Estimated Length of Stay: 5-7 Days Last 3 [place] Suicide Severity Risk Score: Flowsheet Row Ad[facility] (Current) [facility] from 12/19/2022 in [facility] Department [facility] ED from 11/12/2022 in [facility]  C-SSRS RISK CATEGORY High Risk High Risk Moderate Risk       Last [facility] 2/9 Scores:     No data to display          Scribe for Treatment Team: Izell Larchmont,  Alexander Mt 04/15/2023 12:52 PM

## 2023-04-15 NOTE — Progress Notes (Signed)
   04/15/23 0711  15 Minute Checks  Location Bedroom  Visual Appearance Calm  Behavior Composed  Sleep (Behavioral Health Patients Only)  Calculate sleep? (Click Yes once per 24 hr at 0600 safety check) Yes  Documented sleep last 24 hours 6.25

## 2023-04-15 NOTE — [facility] Group Notes (Signed)
Pt did attend AA group  

## 2023-04-15 NOTE — Plan of Care (Signed)
  Problem: Education: Goal: Utilization of techniques to improve thought processes will improve Outcome: Progressing Goal: Knowledge of the prescribed therapeutic regimen will improve Outcome: Progressing   Problem: Activity: Goal: Interest or engagement in leisure activities will improve Outcome: Progressing Goal: Imbalance in normal sleep/wake cycle will improve Outcome: Progressing   Problem: Coping: Goal: Coping ability will improve Outcome: Progressing   Problem: Safety: Goal: Periods of time without injury will increase Outcome: Progressing

## 2023-04-15 NOTE — Progress Notes (Signed)
   04/15/23 0828  Psych Admission Type (Psych Patients Only)  Admission Status Involuntary  Psychosocial Assessment  Patient Complaints Anxiety  Eye Contact Fair  Facial Expression Animated  Affect Appropriate to circumstance  Speech Logical/coherent  Interaction Childlike  Motor Activity Slow  Appearance/Hygiene Unremarkable  Behavior Characteristics Appropriate to situation  Mood Anxious;Depressed;Pleasant  Thought Process  Coherency WDL  Content WDL  Delusions None reported or observed  Perception WDL  Hallucination None reported or observed  Judgment Limited  Confusion None  Danger to Self  Current suicidal ideation? Denies  Self-Injurious Behavior No self-injurious ideation or behavior indicators observed or expressed   Agreement Not to Harm Self Yes  Description of Agreement Verbal  Danger to Others  Danger to Others None reported or observed

## 2023-04-15 NOTE — Progress Notes (Signed)
   04/15/23 2314  Psych Admission Type (Psych Patients Only)  Admission Status Involuntary  Psychosocial Assessment  Patient Complaints Anxiety  Eye Contact Fair  Facial Expression Animated  Affect Appropriate to circumstance  Speech Logical/coherent  Interaction Childlike  Motor Activity Slow  Appearance/Hygiene Unremarkable  Behavior Characteristics Appropriate to situation  Mood Anxious;Depressed;Pleasant  Thought Process  Coherency WDL  Content WDL  Delusions None reported or observed  Perception WDL  Hallucination None reported or observed  Judgment Limited  Confusion None  Danger to Self  Current suicidal ideation? Denies  Self-Injurious Behavior No self-injurious ideation or behavior indicators observed or expressed   Agreement Not to Harm Self Yes  Description of Agreement verbal  Danger to Others  Danger to Others None reported or observed

## 2023-04-15 NOTE — Group Note (Signed)
Recreation Therapy Group Note   Group Topic:Stress Management  Group Date: 04/15/2023 Start Time: 0930 End Time: 0950 Facilitators: Remedy Corporan-McCall, LRT,CTRS Location: 300 [address] Dayroom   Group Topic: Stress Management   Goal Area(s) Addresses:  Patient will actively participate in stress management techniques presented during session.  Patient will successfully identify benefit of practicing stress management post d/c.   Intervention: Relaxation exercise with ambient sound and script   Group Description: Guided Imagery. LRT provided education, instruction, and demonstration on practice of visualization via guided imagery. Patient was asked to participate in the technique introduced during session. LRT debriefed including topics of mindfulness, stress management and specific scenarios each patient could use these techniques. Patients were given suggestions of ways to access scripts post d/c and encouraged to explore Youtube and other apps available on smartphones, tablets, and computers.  Education:  Stress Management, Discharge Planning.   Education Outcome: Acknowledges education   Affect/Mood: N/A   Participation Level: Did not attend    Clinical Observations/Individualized Feedback:     Plan: Continue to engage patient in RT group sessions 2-3x/week.   Jevon Shells-McCall, LRT,CTRS 04/15/2023 12:53 PM

## 2023-04-15 NOTE — Group Note (Signed)
Occupational Therapy Group Note  Group Topic:Coping Skills  Group Date: 04/15/2023 Start Time: 1430 End Time: 1500 Facilitators: Ted Mcalpine, OT   Group Description: Group encouraged increased engagement and participation through discussion and activity focused on "Coping Ahead." Patients were split up into teams and selected a card from a stack of positive coping strategies. Patients were instructed to act out/charade the coping skill for other peers to guess and receive points for their team. Discussion followed with a focus on identifying additional positive coping strategies and patients shared how they were going to cope ahead over the weekend while continuing hospitalization stay.  Therapeutic Goal(s): Identify positive vs negative coping strategies. Identify coping skills to be used during hospitalization vs coping skills outside of hospital/at home Increase participation in therapeutic group environment and promote engagement in treatment   Participation Level: Minimal   Participation Quality: Independent   Behavior: Cooperative   Speech/Thought Process: Focused   Affect/Mood: Appropriate   Insight: Fair   Judgement: Fair      Modes of Intervention: Education  Patient Response to Interventions:  Attentive   Plan: Continue to engage patient in OT groups 2 - 3x/week.  04/15/2023  Ted Mcalpine, OT Kerrin Champagne, OT

## 2023-04-15 NOTE — Progress Notes (Signed)
[facility] MD Progress Note  04/15/2023 2:58 PM Yolanda [name]  MRN:  [id number]  Principal Problem: Schizoaffective disorder, depressive type (HCC)  Diagnosis: Principal Problem:   Schizoaffective disorder, depressive type (HCC) Active Problems:   GERD (gastroesophageal reflux disease)   Intellectual disability   Tobacco use disorder  Reason for admission: This is the first psychiatric admission in this [facility] in 12 years for this 47 AA female with an extensive hx of mental illnesses & probable polysubstance use disorders. She is admitted to the [facility] from the [facility] hospital with complain of worsening suicidal ideations with plan to stab herself. Per chart review, patient apparently reported at the ED that she has been depressed for a while & has not been taking her mental health medications. After medical evaluation.clearance, she was transferred to the [facility] for further psychiatric evaluation/treatments.   24 hr chart review: V/S within normal limits, continues to take medications as scheduled, no PRN medications given in the past 24 hours, no agitation protocol medications administered in past 24 hours.  Continues to not engage with peers or attend unit group sessions. Continues to Isolate in her room but is typically more visible in the milieu at lunch and dinner time. No behavioral concerns noted from nursing. Slept through the night last night as per nursing reports and documentation.  Patient assessment note: Chart reviewed, case discussed with treatment team. Mood remains dysphoric and depressed as pt is continuing to verbalize her frustration at the fact that she is continuing to remain hospitalized.  However, she denies SI/HI/AVH, and denies any side effects to her medications.  She is continuing to be provided with positive reinforcements to engage in unit group activities.  She reports appetite today is fair, reports a good sleep quality last night, denies being in any physical pain, denies  paranoia, there is no evidence of delusional thinking.  Patient was seen in her room, but by mid afternoon, she was visible at the nurses station where she was observed to be talking to staff and peers.   No TD/EPS type symptoms found on assessment, and pt denies any feelings of stiffness. AIMS: 0. New EKG in the context of antipsychotic medications for Qtc WNL.  Pt is continuing to require to be hospitalized until a placement in the community that is safe for her is found.  We continue to coordinate with CSW and DSS to achieve this goal.  We will continue to follow.  Continuing all medications as listed below, as there are no indications for medication changes at this time, as patient's mental status is currently stable for continuity of care on an outpatient basis, but we continue to await placement.  Total Time spent with patient:  45 minutes  Past Psychiatric History:  See H & P  Past Medical History:  Past Medical History:  Diagnosis Date   Bipolar affect, depressed (HCC)    Constipation 08/17/2022   Depression    Falls 07/21/2022   Fracture of femoral neck, right, closed (HCC) 01/17/2022   Herpes simplex 08/22/2017   Open fracture dislocation of right elbow joint 01/17/2022    Past Surgical History:  Procedure Laterality Date   NO PAST SURGERIES     SALPINGECTOMY     Family History: History reviewed. No pertinent family history.  Family Psychiatric  History: See H & P  Social History:  Social History   Substance and Sexual Activity  Alcohol Use Yes     Social History   Substance and Sexual  Activity  Drug Use Yes   Types: Cocaine, Marijuana    Social History   Socioeconomic History   Marital status: Single    Spouse name: Not on file   Number of children: Not on file   Years of education: Not on file   Highest education level: Not on file  Occupational History   Not on file  Tobacco Use   Smoking status: Every Day   Smokeless tobacco: Not on file  Substance  and Sexual Activity   Alcohol use: Yes   Drug use: Yes    Types: Cocaine, Marijuana   Sexual activity: Yes  Other Topics Concern   Not on file  Social History Narrative   Not on file   Social Determinants of Health   Financial Resource Strain: Low Risk  (09/12/2022)   Received from [facility], [facility]   Overall Financial Resource Strain (CARDIA)    Difficulty of Paying Living Expenses: Not hard at all  Food Insecurity: Patient Declined (12/20/2022)   Hunger Vital Sign    Worried About Running Out of Food in the Last Year: Patient declined    Ran Out of Food in the Last Year: Patient declined  Transportation Needs: No Transportation Needs (12/20/2022)   PRAPARE - Administrator, Civil Service (Medical): No    Lack of Transportation (Non-Medical): No  Physical Activity: Not on file  Stress: No Stress Concern Present (07/17/2022)   Received from [facility], [facility] of Occupational Health - Occupational Stress Questionnaire    Feeling of Stress : Not at all  Social Connections: Unknown (07/16/2022)   Received from [facility], [facility]   Social Network    Social Network: Not on file   Sleep: Good  Appetite:  Good  Current Medications: Current Facility-Administered Medications  Medication Dose Route Frequency Provider Last Rate Last Admin   acetaminophen (TYLENOL) tablet 650 mg  650 mg Oral Q6H PRN [name], NP   650 mg at 04/13/23 0811   alum & mag hydroxide-simeth (MAALOX/MYLANTA) 200-200-20 MG/5ML suspension 30 mL  30 mL Oral Q4H PRN [name], NP   30 mL at 01/28/23 1530   busPIRone (BUSPAR) tablet 15 mg  15 mg Oral BID [name], NP   15 mg at 04/15/23 0810   cyanocobalamin (VITAMIN B12) injection 1,000 mcg  1,000 mcg Intramuscular Q30 days Sarita Bottom, MD   1,000 mcg at 04/03/23 1237   diphenhydrAMINE (BENADRYL) capsule 50 mg  50 mg Oral TID PRN [name], NP   50 mg at 01/15/23 1211   Or    diphenhydrAMINE (BENADRYL) injection 50 mg  50 mg Intramuscular TID PRN [name], NP       docusate sodium (COLACE) capsule 100 mg  100 mg Oral Daily Anisah Kuck, NP   100 mg at 04/15/23 0811   feeding supplement (ENSURE ENLIVE / ENSURE PLUS) liquid 237 mL  237 mL Oral TID BM Anguel Delapena, NP   237 mL at 04/15/23 1412   haloperidol (HALDOL) tablet 5 mg  5 mg Oral TID PRN [name], NP   5 mg at 01/15/23 1211   Or   haloperidol lactate (HALDOL) injection 5 mg  5 mg Intramuscular TID PRN [name], NP       hydrOXYzine (ATARAX) tablet 25 mg  25 mg Oral TID PRN [name], DO   25 mg at 04/02/23 2041   LORazepam (ATIVAN) tablet 2 mg  2 mg Oral TID PRN [name], NP   2 mg at 01/15/23 1211   Or   LORazepam (ATIVAN) injection 2 mg  2 mg Intramuscular TID PRN [name], NP       magnesium hydroxide (MILK OF MAGNESIA) suspension 30 mL  30 mL Oral Daily PRN [name], NP   30 mL at 04/13/23 0814   melatonin tablet 5 mg  5 mg Oral QHS Yaslene Lindamood, NP   5 mg at 04/14/23 2057   nicotine (NICODERM CQ - dosed in mg/24 hours) patch 21 mg  21 mg Transdermal Daily [name], DO   21 mg at 03/25/23 1610   nicotine polacrilex (NICORETTE) gum 2 mg  2 mg Oral PRN [name], MD   2 mg at 04/14/23 1830   paliperidone (INVEGA) 24 hr tablet 6 mg  6 mg Oral Daily [name], NP   6 mg at 04/14/23 2057   pantoprazole (PROTONIX) EC tablet 40 mg  40 mg Oral Daily [name], NP   40 mg at 04/15/23 0810   polyethylene glycol (MIRALAX / GLYCOLAX) packet 17 g  17 g Oral Daily PRN [name], NP   17 g at 04/13/23 1734   sertraline (ZOLOFT) tablet 150 mg  150 mg Oral Daily Massengill, Harrold Donath, MD   150 mg at 04/15/23 9604   SUMAtriptan (IMITREX) tablet 25 mg  25 mg Oral BID PRN [name], NP   25 mg at 04/13/23 1243   traZODone (DESYREL) tablet 50 mg  50 mg Oral QHS [name], NP   50 mg at 04/14/23 2057   Vitamin D (Ergocalciferol) (DRISDOL) 1.25 MG (50000  UNIT) capsule 50,000 Units  50,000 Units Oral Q7 days [name], NP   50,000 Units at 04/13/23 1410   Lab Results:  No results found for this or any previous visit (from the past 48 hour(s)).    Blood Alcohol level:  Lab Results  Component Value Date   ETH <10 12/19/2022   ETH <10 11/21/2019   Metabolic Disorder Labs: Lab Results  Component Value Date   HGBA1C 4.4 (L) 12/21/2022   MPG 79.58 12/21/2022   No results found for: "PROLACTIN" Lab Results  Component Value Date   CHOL 218 (H) 12/21/2022   TRIG 81 12/21/2022   HDL 53 12/21/2022   CHOLHDL 4.1 12/21/2022   VLDL 16 12/21/2022   LDLCALC 149 (H) 12/21/2022   LDLCALC 110 (H) 03/13/2011   Physical Findings: AIMS: Facial and Oral Movements Muscles of Facial Expression: None, normal Lips and Perioral Area: None, normal Jaw: None, normal Tongue: None, normal,Extremity Movements Upper (arms, wrists, hands, fingers): None, normal Lower (legs, knees, ankles, toes): None, normal, Trunk Movements Neck, shoulders, hips: None, normal, Overall Severity Severity of abnormal movements (highest score from questions above): None, normal Incapacitation due to abnormal movements: None, normal Patient's awareness of abnormal movements (rate only patient's report): No Awareness, Dental Status Current problems with teeth and/or dentures?: No Does patient usually wear dentures?: No  CIWA:    COWS:    AIMS:0 Musculoskeletal: Strength & Muscle Tone: within normal limits Gait & Station: normal Patient leans: N/A  Psychiatric Specialty Exam:  Presentation  General Appearance:  Casual; Fairly Groomed  Eye Contact: Limited to baseline Speech: Decreased amount, decreased tone and volume but at baseline Speech Volume: Normal  Handedness: Right  Mood and Affect  Mood: Sad and depressed mainly secondary to being in the hospital with no place to go Affect: Congruent  Thought Process  Thought  Processes: Coherent  Descriptions of Associations:Intact  Orientation:Partial  Thought Content:Logical [place] History of Schizophrenia/Schizoaffective disorder:Yes  Duration of Psychotic Symptoms:Greater than six months  Hallucinations:Hallucinations: None    Ideas of Reference: None  Suicidal Thoughts:Suicidal Thoughts: No    Homicidal Thoughts:Homicidal Thoughts: No   Sensorium  Memory: Recent Fair  Judgment: Fair  Insight: Fair  Art therapist  Concentration: Fair  Attention Span: Fair  Recall: Poor  Fund of Knowledge: Poor  Language: Fair  Psychomotor Activity  Psychomotor Activity: Psychomotor Activity: Normal     Assets  Assets: Resilience  Sleep  Sleep: Sleep: Good    Physical Exam: Physical Exam Vitals and nursing note reviewed.  HENT:     Nose: Nose normal.     Mouth/Throat:     Pharynx: Oropharynx is clear.  Eyes:     Pupils: Pupils are equal, round, and reactive to light.  Cardiovascular:     Rate and Rhythm: Normal rate.     Pulses: Normal pulses.  Pulmonary:     Effort: Pulmonary effort is normal.  Genitourinary:    Comments: Deferred Musculoskeletal:        General: Normal range of motion.     Cervical back: Normal range of motion.  Skin:    General: Skin is warm and dry.  Neurological:     General: No focal deficit present.     Mental Status: She is alert and oriented to person, place, and time.  Psychiatric:        Mood and Affect: Mood normal.        Behavior: Behavior normal.        Thought Content: Thought content normal.    Review of Systems  Constitutional:  Negative for chills, diaphoresis and fever.  HENT:  Negative for congestion, hearing loss and sore throat.   Eyes:  Negative for blurred vision.  Respiratory:  Negative for cough, shortness of breath and wheezing.   Cardiovascular:  Negative for chest pain and palpitations.  Gastrointestinal:  Negative for abdominal pain,  constipation, diarrhea, heartburn, nausea and vomiting.  Genitourinary:  Negative for dysuria.  Musculoskeletal:  Negative for joint pain and myalgias.  Skin:  Negative for rash.  Neurological:  Negative for dizziness.  Psychiatric/Behavioral:  Positive for depression. Negative for hallucinations, memory loss, substance abuse and suicidal ideas. The patient is nervous/anxious and has insomnia.   All other systems reviewed and are negative.  Blood pressure 107/76, pulse 70, temperature 98.3 F (36.8 C), temperature source Oral, resp. rate 16, height 4\' 11"  (1.499 m), weight 63 kg, SpO2 97%. Body mass index is 28.05 kg/m.  Treatment Plan Summary: Daily contact with patient to assess and evaluate symptoms and progress in treatment and Medication management.    Still waiting on safe disposition as patient would not be able to live independently given cognitive impairment. Recommend assisted living.    No medication side effects reported.  Because of urinary incontinence, recommended adult diapers to be worn.   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  Plan:  -Continue Imitrex 25 mg PRN BID for migraines -Continue Colace 100 mg daily for constipation -Continue Vitamin D 50.000 units weekly for bone health. -Continue Sertraline150 mg po Q daily for depression/anxiety -Continue Buspar 15 mg po bid for anxiety.  -Continue paliperidone 6 mg po qd for mood stabilization -Continue  Trazodone 50 mg nightly for insomnia  -Continue Nicoderm 21 mg topically Q 24 hrs for nicotine withdrawal management. -Continue vitamin B12 1000 mcg IM weekly for 4 weeks then monthly -Continue Melatonin 5 mg nightly for sleep -Continue Protonix EC 40 mg p.o. daily for  GERD -Continue MiraLAX 17 g p.o. PRN for constipation -Continue hydroxyzine 25 mg p.o. 3 times daily as needed for anxiety -Continue Ensure nutritional shakes TID in between meals  -Previously discontinued Hydroxyzine 50 mg nightly-hypotension in the mornings   Safety and Monitoring: Voluntary admission to inpatient psychiatric unit for safety, stabilization and treatment Daily contact with patient to assess and evaluate symptoms and progress in treatment Patient's case to be discussed in multi-disciplinary team meeting Observation Level : q15 minute checks Vital signs: q12 hours Precautions: Safety   Discharge Planning: Social work and case management to assist with discharge planning and identification of hospital follow-up needs prior to discharge Estimated LOS: Unknown at this time. Discharge Concerns: Need to establish a safety plan; Medication compliance and effectiveness Discharge Goals: Return home with outpatient referrals for mental health follow-up including medication management/psychotherapy No legal guardian, has payee  [name], NP,  04/15/2023, 2:58 PM Patient ID: Yolanda [name], female   DOB: 1975-07-19, 47 y.o.   MRN: [id number]

## 2023-04-15 NOTE — Plan of Care (Signed)
  Problem: Activity: Goal: Interest or engagement in leisure activities will improve Outcome: Progressing   Problem: Coping: Goal: Coping ability will improve Outcome: Progressing   

## 2023-04-16 DIAGNOSIS — F251 Schizoaffective disorder, depressive type: Secondary | ICD-10-CM | POA: Diagnosis not present

## 2023-04-16 NOTE — B[facility] Group Notes (Signed)
Adult Psychoeducational Group Note  Date:  04/16/2023 Time:  8:53 PM  Group Topic/Focus:  Wrap-Up Group:   The focus of this group is to help patients review their daily goal of treatment and discuss progress on daily workbooks.  Participation Level:  Active  Participation Quality:  Appropriate  Affect:  Appropriate  Cognitive:  Appropriate  Insight: Appropriate  Engagement in Group:  Engaged  Modes of Intervention:  Discussion and Support  Additional Comments:  Pt told that today was a good day on the unit, the highlight of which was talking to her 49-year-old grandson on the phone. On the subject of staying well upon discharge, Pt mentioned staying on her medications as prescribed and also "not letting things trigger me." Pt rated her day an 8 out of 10.  Christ Kick 04/16/2023, 8:53 PM

## 2023-04-16 NOTE — Progress Notes (Signed)
[facility] MD Progress Note  04/16/2023 2:49 PM Yolanda [name]  MRN:  [id number]  Principal Problem: Schizoaffective disorder, depressive type (HCC)  Diagnosis: Principal Problem:   Schizoaffective disorder, depressive type (HCC) Active Problems:   GERD (gastroesophageal reflux disease)   Intellectual disability   Tobacco use disorder  Reason for admission: This is the first psychiatric admission in this [facility] in 12 years for this 47 AA female with an extensive hx of mental illnesses & probable polysubstance use disorders. She is admitted to the [facility] from the [facility] hospital with complain of worsening suicidal ideations with plan to stab herself. Per chart review, patient apparently reported at the ED that she has been depressed for a while & has not been taking her mental health medications. After medical evaluation.clearance, she was transferred to the [facility] for further psychiatric evaluation/treatments.   24 hr chart review: V/S have been within normal limits, requiring vitals for today and RN notified. Pt continues to be compliant with medications as scheduled, no PRN medications given in the past 24 hours, no agitation protocol medications administered in past 24 hours.  Continues to Isolate in her room but is typically more visible in the milieu at lunch and dinner time. No behavioral concerns noted from nursing. Slept through the night last night as per nursing reports and documentation.  Patient assessment note: Chart reviewed, case discussed with treatment team. Mood continues to dysphoric and depressed,  pt is continuing to verbalize her frustration at her continuous hospitalization. Pt  denies SI/HI/AVH, and denies any side effects to her medications.  She is continuing to be provided with positive reinforcements to engage in unit group activities.  She reports appetite today is fair, reports a good sleep quality last night, denies being in any physical pain, denies paranoia, there is no  evidence of delusional thinking.  Patient is continuing to stay positive and state that she is hoping to get out of the hospital soon.    Pt is continuing to require to be hospitalized until a placement in the community that is safe for her is found.  We continue to coordinate with CSW and DSS to achieve this goal. CSW states that pt was assigned a temporary guardian yesterday by DSS, and CSW plans to meet with her tomorrow. Patient's mental status is currently stable for continuity of care on an outpatient basis, but we continue to await placement.   Continuing all medications as listed below, as there are no indications for medication changes at this time.  No TD/EPS type symptoms found on assessment, and pt denies any feelings of stiffness. AIMS: 0. New EKG in the context of antipsychotic medications for Qtc WNL.  Total Time spent with patient:  45 minutes  Past Psychiatric History:  See H & P  Past Medical History:  Past Medical History:  Diagnosis Date   Bipolar affect, depressed (HCC)    Constipation 08/17/2022   Depression    Falls 07/21/2022   Fracture of femoral neck, right, closed (HCC) 01/17/2022   Herpes simplex 08/22/2017   Open fracture dislocation of right elbow joint 01/17/2022    Past Surgical History:  Procedure Laterality Date   NO PAST SURGERIES     SALPINGECTOMY     Family History: History reviewed. No pertinent family history.  Family Psychiatric  History: See H & P  Social History:  Social History   Substance and Sexual Activity  Alcohol Use Yes     Social History   Substance and  Sexual Activity  Drug Use Yes   Types: Cocaine, Marijuana    Social History   Socioeconomic History   Marital status: Single    Spouse name: Not on file   Number of children: Not on file   Years of education: Not on file   Highest education level: Not on file  Occupational History   Not on file  Tobacco Use   Smoking status: Every Day   Smokeless tobacco: Not on  file  Substance and Sexual Activity   Alcohol use: Yes   Drug use: Yes    Types: Cocaine, Marijuana   Sexual activity: Yes  Other Topics Concern   Not on file  Social History Narrative   Not on file   Social Determinants of Health   Financial Resource Strain: Low Risk  (09/12/2022)   Received from [facility], [facility]   Overall Financial Resource Strain (CARDIA)    Difficulty of Paying Living Expenses: Not hard at all  Food Insecurity: Patient Declined (12/20/2022)   Hunger Vital Sign    Worried About Running Out of Food in the Last Year: Patient declined    Ran Out of Food in the Last Year: Patient declined  Transportation Needs: No Transportation Needs (12/20/2022)   PRAPARE - Administrator, Civil Service (Medical): No    Lack of Transportation (Non-Medical): No  Physical Activity: Not on file  Stress: No Stress Concern Present (07/17/2022)   Received from [facility], [facility] - Occupational Stress Questionnaire    Feeling of Stress : Not at all  Social Connections: Unknown (07/16/2022)   Received from [facility], [facility]   Social Network    Social Network: Not on file   Sleep: Good  Appetite:  Good  Current Medications: Current Facility-Administered Medications  Medication Dose Route Frequency Provider Last Rate Last Admin   acetaminophen (TYLENOL) tablet 650 mg  650 mg Oral Q6H PRN [name], NP   650 mg at 04/15/23 2233   alum & mag hydroxide-simeth (MAALOX/MYLANTA) 200-200-20 MG/5ML suspension 30 mL  30 mL Oral Q4H PRN [name], NP   30 mL at 01/28/23 1530   busPIRone (BUSPAR) tablet 15 mg  15 mg Oral BID [name], NP   15 mg at 04/16/23 0802   cyanocobalamin (VITAMIN B12) injection 1,000 mcg  1,000 mcg Intramuscular Q30 days Sarita Bottom, MD   1,000 mcg at 04/03/23 1237   diphenhydrAMINE (BENADRYL) capsule 50 mg  50 mg Oral TID PRN [name], NP   50 mg at 01/15/23 1211    Or   diphenhydrAMINE (BENADRYL) injection 50 mg  50 mg Intramuscular TID PRN [name], NP       docusate sodium (COLACE) capsule 100 mg  100 mg Oral Daily Lova Urbieta, NP   100 mg at 04/16/23 0802   feeding supplement (ENSURE ENLIVE / ENSURE PLUS) liquid 237 mL  237 mL Oral TID BM Ferdinand Revoir, NP   237 mL at 04/16/23 1429   haloperidol (HALDOL) tablet 5 mg  5 mg Oral TID PRN [name], NP   5 mg at 01/15/23 1211   Or   haloperidol lactate (HALDOL) injection 5 mg  5 mg Intramuscular TID PRN [name], NP       hydrOXYzine (ATARAX) tablet 25 mg  25 mg Oral TID PRN [name], DO   25 mg at 04/02/23 2041   LORazepam (ATIVAN) tablet 2  mg  2 mg Oral TID PRN [name], NP   2 mg at 01/15/23 1211   Or   LORazepam (ATIVAN) injection 2 mg  2 mg Intramuscular TID PRN [name], NP       magnesium hydroxide (MILK OF MAGNESIA) suspension 30 mL  30 mL Oral Daily PRN [name], NP   30 mL at 04/13/23 0814   melatonin tablet 5 mg  5 mg Oral QHS Martavia Tye, NP   5 mg at 04/15/23 2120   nicotine (NICODERM CQ - dosed in mg/24 hours) patch 21 mg  21 mg Transdermal Daily [name], DO   21 mg at 03/25/23 6962   nicotine polacrilex (NICORETTE) gum 2 mg  2 mg Oral PRN [name], MD   2 mg at 04/14/23 1830   paliperidone (INVEGA) 24 hr tablet 6 mg  6 mg Oral Daily [name], NP   6 mg at 04/15/23 2120   pantoprazole (PROTONIX) EC tablet 40 mg  40 mg Oral Daily [name], NP   40 mg at 04/16/23 0802   polyethylene glycol (MIRALAX / GLYCOLAX) packet 17 g  17 g Oral Daily PRN [name], NP   17 g at 04/13/23 1734   sertraline (ZOLOFT) tablet 150 mg  150 mg Oral Daily Massengill, Harrold Donath, MD   150 mg at 04/16/23 0802   SUMAtriptan (IMITREX) tablet 25 mg  25 mg Oral BID PRN [name], NP   25 mg at 04/13/23 1243   traZODone (DESYREL) tablet 50 mg  50 mg Oral QHS [name], NP   50 mg at 04/15/23 2120   Vitamin D (Ergocalciferol) (DRISDOL) 1.25 MG  (50000 UNIT) capsule 50,000 Units  50,000 Units Oral Q7 days [name], NP   50,000 Units at 04/13/23 1410   Lab Results:  No results found for this or any previous visit (from the past 48 hour(s)).    Blood Alcohol level:  Lab Results  Component Value Date   ETH <10 12/19/2022   ETH <10 11/21/2019   Metabolic Disorder Labs: Lab Results  Component Value Date   HGBA1C 4.4 (L) 12/21/2022   MPG 79.58 12/21/2022   No results found for: "PROLACTIN" Lab Results  Component Value Date   CHOL 218 (H) 12/21/2022   TRIG 81 12/21/2022   HDL 53 12/21/2022   CHOLHDL 4.1 12/21/2022   VLDL 16 12/21/2022   LDLCALC 149 (H) 12/21/2022   LDLCALC 110 (H) 03/13/2011   Physical Findings: AIMS: Facial and Oral Movements Muscles of Facial Expression: None Lips and Perioral Area: None Jaw: None Tongue: None,Extremity Movements Upper (arms, wrists, hands, fingers): None Lower (legs, knees, ankles, toes): None, Trunk Movements Neck, shoulders, hips: None, Global Judgements Severity of abnormal movements overall : None Incapacitation due to abnormal movements: None Patient's awareness of abnormal movements: No Awareness, Dental Status Current problems with teeth and/or dentures?: No Does patient usually wear dentures?: No  CIWA:    COWS:    AIMS:0 Musculoskeletal: Strength & Muscle Tone: within normal limits Gait & Station: normal Patient leans: N/A  Psychiatric Specialty Exam:  Presentation  General Appearance:  Casual; Disheveled  Eye Contact: Limited to baseline Speech: Decreased amount, decreased tone and volume but at baseline Speech Volume: Normal  Handedness: Right  Mood and Affect  Mood: Sad and depressed mainly secondary to being in the hospital with no place to go Affect: Congruent  Thought Process  Thought Processes: Coherent  Descriptions of Associations:Intact  Orientation:Partial  Thought  Content:Logical Concrete History of  Schizophrenia/Schizoaffective disorder:Yes  Duration of Psychotic Symptoms:Greater than six months  Hallucinations:Hallucinations: None    Ideas of Reference: None  Suicidal Thoughts:Suicidal Thoughts: No    Homicidal Thoughts:Homicidal Thoughts: No   Sensorium  Memory: Recent Poor  Judgment: Poor  Insight: Fair  Art therapist  Concentration: Poor  Attention Span: Poor  Recall: Poor  Fund of Knowledge: Poor  Language: Fair  Psychomotor Activity  Psychomotor Activity: Psychomotor Activity: Normal     Assets  Assets: Resilience  Sleep  Sleep: Sleep: Good    Physical Exam: Physical Exam Vitals and nursing note reviewed.  HENT:     Nose: Nose normal.     Mouth/Throat:     Pharynx: Oropharynx is clear.  Eyes:     Pupils: Pupils are equal, round, and reactive to light.  Cardiovascular:     Rate and Rhythm: Normal rate.     Pulses: Normal pulses.  Pulmonary:     Effort: Pulmonary effort is normal.  Genitourinary:    Comments: Deferred Musculoskeletal:        General: Normal range of motion.     Cervical back: Normal range of motion.  Skin:    General: Skin is warm and dry.  Neurological:     General: No focal deficit present.     Mental Status: She is alert and oriented to person, place, and time.  Psychiatric:        Mood and Affect: Mood normal.        Behavior: Behavior normal.        Thought Content: Thought content normal.    Review of Systems  Constitutional:  Negative for chills, diaphoresis and fever.  HENT:  Negative for congestion, hearing loss and sore throat.   Eyes:  Negative for blurred vision.  Respiratory:  Negative for cough, shortness of breath and wheezing.   Cardiovascular:  Negative for chest pain and palpitations.  Gastrointestinal:  Negative for abdominal pain, constipation, diarrhea, heartburn, nausea and vomiting.  Genitourinary:  Negative for dysuria.  Musculoskeletal:  Negative for joint pain  and myalgias.  Skin:  Negative for rash.  Neurological:  Negative for dizziness.  Psychiatric/Behavioral:  Positive for depression. Negative for hallucinations, memory loss, substance abuse and suicidal ideas. The patient is nervous/anxious and has insomnia.   All other systems reviewed and are negative.  Blood pressure 100/65, pulse 81, temperature 98.3 F (36.8 C), temperature source Oral, resp. rate 16, height 4\' 11"  (1.499 m), weight 63 kg, SpO2 98%. Body mass index is 28.05 kg/m.  Treatment Plan Summary: Daily contact with patient to assess and evaluate symptoms and progress in treatment and Medication management.    Still waiting on safe disposition as patient would not be able to live independently given cognitive impairment. Recommend assisted living.    No medication side effects reported.  Because of urinary incontinence, recommended adult diapers to be worn.   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  Plan:  -Continue Imitrex 25 mg PRN BID for migraines -Continue Colace 100 mg daily for constipation -Continue Vitamin D 50.000 units weekly for bone health. -Continue Sertraline150 mg po Q daily for depression/anxiety -Continue Buspar 15 mg po bid for anxiety.  -Continue paliperidone 6 mg po qd for mood stabilization -Continue Trazodone 50 mg nightly for insomnia  -Continue Nicoderm 21 mg topically Q 24  hrs for nicotine withdrawal management. -Continue vitamin B12 1000 mcg IM weekly for 4 weeks then monthly -Continue Melatonin 5 mg nightly for sleep -Continue Protonix EC 40 mg p.o. daily for GERD -Continue MiraLAX 17 g p.o. PRN for constipation -Continue hydroxyzine 25 mg p.o. 3 times daily as needed for anxiety -Continue Ensure  nutritional shakes TID in between meals  -Previously discontinued Hydroxyzine 50 mg nightly-hypotension in the mornings   Safety and Monitoring: Voluntary admission to inpatient psychiatric unit for safety, stabilization and treatment Daily contact with patient to assess and evaluate symptoms and progress in treatment Patient's case to be discussed in multi-disciplinary team meeting Observation Level : q15 minute checks Vital signs: q12 hours Precautions: Safety   Discharge Planning: Social work and case management to assist with discharge planning and identification of hospital follow-up needs prior to discharge Estimated LOS: Unknown at this time. Discharge Concerns: Need to establish a safety plan; Medication compliance and effectiveness Discharge Goals: Return home with outpatient referrals for mental health follow-up including medication management/psychotherapy No legal guardian, has payee  [name], NP,  04/16/2023, 2:49 PM Patient ID: Larey Brick, female   DOB: [birth year], 46 y.o.   MRN: [id number]  Patient ID: SIRENA RIDDLE, female   DOB: 19-May-1976, 47 y.o.   MRN: [id number]

## 2023-04-16 NOTE — Plan of Care (Signed)
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
## 2023-04-16 NOTE — Progress Notes (Signed)
   04/16/23 1300  Psych Admission Type (Psych Patients Only)  Admission Status Involuntary  Psychosocial Assessment  Patient Complaints Anxiety  Eye Contact Fair  Facial Expression Animated  Affect Appropriate to circumstance;Depressed  Speech Logical/coherent  Interaction Childlike  Motor Activity Slow  Appearance/Hygiene Unremarkable  Behavior Characteristics Appropriate to situation  Mood Anxious;Depressed  Aggressive Behavior  Effect No apparent injury  Thought Process  Coherency WDL  Content WDL  Delusions None reported or observed  Perception WDL  Hallucination None reported or observed  Judgment Limited  Confusion None  Danger to Self  Current suicidal ideation? Denies  Description of Suicide Plan No plan  Self-Injurious Behavior No self-injurious ideation or behavior indicators observed or expressed   Agreement Not to Harm Self Yes  Description of Agreement verbal  Danger to Others  Danger to Others None reported or observed

## 2023-04-16 NOTE — [facility] Group Notes (Signed)
[facility] Group Notes:  (Nursing/MHT/Case Management/Adjunct)  Date:  04/16/2023  Time:  2:05 PM  Type of Therapy:  Psychoeducational Skills  Participation Level:  Active  Participation Quality:  Appropriate  Affect:  Appropriate  Cognitive:  Alert and Appropriate  Insight:  Appropriate  Engagement in Group:  Engaged  Modes of Intervention:  Discussion, Education, and Exploration  Summary of Progress/Problems:  Psychoeducational group discussing recognizing mental decompensation warning signs and how to implement healthy coping skills to promote mental wellbeing. Pt attended and was appropriate.   Yolanda [name] 04/16/2023, 2:05 PM

## 2023-04-16 NOTE — Group Note (Signed)
Recreation Therapy Group Note   Group Topic:Animal Assisted Therapy   Group Date: 04/16/2023 Start Time: 9629 End Time: 1035 Facilitators: Kollin Udell-McCall, LRT,CTRS Location: [address] Dayroom   Animal-Assisted Activity (AAA) Program Checklist/Progress Notes Patient Eligibility Criteria Checklist & Daily Group note for Rec Tx Intervention  AAA/T Program Assumption of Risk Form signed by Patient/ or Parent Legal Guardian Yes  Patient understands his/her participation is voluntary Yes  Education: Charity fundraiser, Appropriate Animal Interaction   Education Outcome: Acknowledges education.    Affect/Mood: N/A   Participation Level: Did not attend    Clinical Observations/Individualized Feedback:     Plan: Continue to engage patient in RT group sessions 2-3x/week.   Yolanda [name], LRT,CTRS  04/16/2023 12:51 PM

## 2023-04-16 NOTE — Plan of Care (Signed)
  Problem: Education: Goal: Utilization of techniques to improve thought processes will improve Outcome: Progressing Goal: Knowledge of the prescribed therapeutic regimen will improve Outcome: Progressing   

## 2023-04-16 NOTE — Group Note (Signed)
LCSW Group Therapy Note   Group Date: 04/16/2023 Start Time: 1100 End Time: 1200  Type of Therapy and Topic:  Group Therapy: Are You Under Stress?!?!  Participation Level:  Did Not Attend  Description of Group: This process group involved patients examining stress and how it impacts their lives. Distress and eustress definitions were explained and patients identified both good and bad stressors in their lives. Accompanying worksheet was used to help patients identify the physical and emotional symptoms of stress and techniques/copings skills they utilize to help reduce these symptoms.    Therapeutic Goals:  1.  Patients will talk about their experiences with distress and eustress. 2. Patients will identify stressors in their lives and the physical and emotional  symptoms they experience while under stress. 3. Patients to identify actions/coping skills that they utilize to help ameliorate  symptoms of stress.  Summary of Patient Progress:   Pt did not attend group   Therapeutic Modalities:  Cognitive Behavioral Therapy Solution-Focused Therapy  Izell Wellford LCSW 04/16/2023 2:38 PM

## 2023-04-17 DIAGNOSIS — F251 Schizoaffective disorder, depressive type: Secondary | ICD-10-CM | POA: Diagnosis not present

## 2023-04-17 NOTE — Group Note (Signed)
Recreation Therapy Group Note   Group Topic:Team Building  Group Date: 04/17/2023 Start Time: 0930 End Time: 1005 Facilitators: Felis Quillin-McCall, LRT,CTRS Location: [address] Dayroom   Goal Area(s) Addresses:  Patient will effectively work with peer towards shared goal.  Patient will identify skills used to make activity successful.  Patient will identify how skills used during activity can be used to reach post d/c goals.   Intervention: STEM Activity  Group Description: Straw Bridge. In teams of 3-5, patients were given 15 plastic drinking straws and an equal length of masking tape. Using the materials provided, patients were instructed to build a free standing bridge-like structure to suspend an everyday item (ex: puzzle box) off of the floor or table surface. All materials were required to be used by the team in their design. LRT facilitated post-activity discussion reviewing team process. Patients were encouraged to reflect how the skills used in this activity can be generalized to daily life post discharge.   Education: Pharmacist, community, Scientist, physiological, Discharge Planning   Education Outcome: Acknowledges education/In group clarification offered/Needs additional education.    Affect/Mood: N/A   Participation Level: Did not attend    Clinical Observations/Individualized Feedback:     Plan: Continue to engage patient in RT group sessions 2-3x/week.   Reginald Weida-McCall, LRT,CTRS 04/17/2023 12:41 PM

## 2023-04-17 NOTE — Progress Notes (Signed)
[facility] MD Progress Note  04/17/2023 9:31 AM Yolanda [name]  MRN:  [id number]  Principal Problem: Schizoaffective disorder, depressive type (HCC)  Diagnosis: Principal Problem:   Schizoaffective disorder, depressive type (HCC) Active Problems:   GERD (gastroesophageal reflux disease)   Intellectual disability   Tobacco use disorder  Reason for admission: This is the first psychiatric admission in this [facility] in 12 years for this 47 AA female with an extensive hx of mental illnesses & probable polysubstance use disorders. She is admitted to the [facility] from the [facility] hospital with complain of worsening suicidal ideations with plan to stab herself. Per chart review, patient apparently reported at the ED that she has been depressed for a while & has not been taking her mental health medications. After medical evaluation.clearance, she was transferred to the [facility] for further psychiatric evaluation/treatments.   24 hr chart review: Staff reports that patient has been unchanged.  She is sleeping well and denies any side effects on medications.  She continues to endorse depression.  No agitation protocol was administered over the last 24 hours.  She continues to isolate herself to the room.  We were also informed that patient now has a guardian who will be having a meeting today at 1 PM regarding her placement.  Patient assessment note: Chart reviewed, the patient was interviewed.  She was lying in bed and was noted to be somewhat disheveled and withdrawn.  Her affect was dysphoric and she endorses depression at a 6/10.  She continues to express ongoing frustration of the extended hospitalization but has been trying to keep herself entertained by reading books or doing crossword puzzles.  She is contracting for safety and denies any active SI/HI/AVH.  No significant change in her mental status.   Pt is continuing to require to be hospitalized until a placement in the community that is safe for her is  found.  We continue to coordinate with CSW and DSS to achieve this goal. CSW states that pt was assigned a temporary guardian by DSS and the CSW plans to meet her today at 1 PM. Patient's mental status is currently stable for continuity of care on an outpatient basis, but we continue to await placement.   Continuing all medications as listed below, as there are no indications for medication changes at this time.  No TD/EPS type symptoms found on assessment, and pt denies any feelings of stiffness. AIMS: 0. New EKG in the context of antipsychotic medications for Qtc WNL.  Total Time spent with patient:  45 minutes  Past Psychiatric History:  See H & P  Past Medical History:  Past Medical History:  Diagnosis Date   Bipolar affect, depressed (HCC)    Constipation 08/17/2022   Depression    Falls 07/21/2022   Fracture of femoral neck, right, closed (HCC) 01/17/2022   Herpes simplex 08/22/2017   Open fracture dislocation of right elbow joint 01/17/2022    Past Surgical History:  Procedure Laterality Date   NO PAST SURGERIES     SALPINGECTOMY     Family History: History reviewed. No pertinent family history.  Family Psychiatric  History: See H & P  Social History:  Social History   Substance and Sexual Activity  Alcohol Use Yes     Social History   Substance and Sexual Activity  Drug Use Yes   Types: Cocaine, Marijuana    Social History   Socioeconomic History   Marital status: Single    Spouse name: Not on  file   Number of children: Not on file   Years of education: Not on file   Highest education level: Not on file  Occupational History   Not on file  Tobacco Use   Smoking status: Every Day   Smokeless tobacco: Not on file  Substance and Sexual Activity   Alcohol use: Yes   Drug use: Yes    Types: Cocaine, Marijuana   Sexual activity: Yes  Other Topics Concern   Not on file  Social History Narrative   Not on file   Social Determinants of Health    Financial Resource Strain: Low Risk  (09/12/2022)   Received from [facility], [facility]   Overall Financial Resource Strain (CARDIA)    Difficulty of Paying Living Expenses: Not hard at all  Food Insecurity: Patient Declined (12/20/2022)   Hunger Vital Sign    Worried About Running Out of Food in the Last Year: Patient declined    Ran Out of Food in the Last Year: Patient declined  Transportation Needs: No Transportation Needs (12/20/2022)   PRAPARE - Administrator, Civil Service (Medical): No    Lack of Transportation (Non-Medical): No  Physical Activity: Not on file  Stress: No Stress Concern Present (07/17/2022)   Received from [facility], [facility] of Occupational Health - Occupational Stress Questionnaire    Feeling of Stress : Not at all  Social Connections: Unknown (07/16/2022)   Received from [facility], [facility]   Social Network    Social Network: Not on file   Sleep: Good  Appetite:  Good  Current Medications: Current Facility-Administered Medications  Medication Dose Route Frequency Provider Last Rate Last Admin   acetaminophen (TYLENOL) tablet 650 mg  650 mg Oral Q6H PRN [name], NP   650 mg at 04/15/23 2233   alum & mag hydroxide-simeth (MAALOX/MYLANTA) 200-200-20 MG/5ML suspension 30 mL  30 mL Oral Q4H PRN [name], NP   30 mL at 01/28/23 1530   busPIRone (BUSPAR) tablet 15 mg  15 mg Oral BID [name], NP   15 mg at 04/17/23 0740   cyanocobalamin (VITAMIN B12) injection 1,000 mcg  1,000 mcg Intramuscular Q30 days [name], Nadir, MD   1,000 mcg at 04/03/23 1237   diphenhydrAMINE (BENADRYL) capsule 50 mg  50 mg Oral TID PRN [name], NP   50 mg at 01/15/23 1211   Or   diphenhydrAMINE (BENADRYL) injection 50 mg  50 mg Intramuscular TID PRN [name], NP       docusate sodium (COLACE) capsule 100 mg  100 mg Oral Daily Nkwenti, Doris, NP   100 mg at 04/17/23 0740   feeding supplement (ENSURE  ENLIVE / ENSURE PLUS) liquid 237 mL  237 mL Oral TID BM Nkwenti, Doris, NP   237 mL at 04/16/23 1429   haloperidol (HALDOL) tablet 5 mg  5 mg Oral TID PRN [name], NP   5 mg at 01/15/23 1211   Or   haloperidol lactate (HALDOL) injection 5 mg  5 mg Intramuscular TID PRN [name], NP       hydrOXYzine (ATARAX) tablet 25 mg  25 mg Oral TID PRN [name], DO   25 mg at 04/02/23 2041   LORazepam (ATIVAN) tablet 2 mg  2 mg Oral TID PRN [name], NP   2 mg at 01/15/23 1211   Or   LORazepam (ATIVAN) injection 2 mg  2 mg Intramuscular TID  PRN [name], NP       magnesium hydroxide (MILK OF MAGNESIA) suspension 30 mL  30 mL Oral Daily PRN [name], NP   30 mL at 04/13/23 0814   melatonin tablet 5 mg  5 mg Oral QHS Nkwenti, Doris, NP   5 mg at 04/16/23 2054   nicotine (NICODERM CQ - dosed in mg/24 hours) patch 21 mg  21 mg Transdermal Daily [name], DO   21 mg at 03/25/23 9147   nicotine polacrilex (NICORETTE) gum 2 mg  2 mg Oral PRN [name], MD   2 mg at 04/14/23 1830   paliperidone (INVEGA) 24 hr tablet 6 mg  6 mg Oral Daily [name], NP   6 mg at 04/16/23 2054   pantoprazole (PROTONIX) EC tablet 40 mg  40 mg Oral Daily [name], NP   40 mg at 04/17/23 0741   polyethylene glycol (MIRALAX / GLYCOLAX) packet 17 g  17 g Oral Daily PRN [name], NP   17 g at 04/13/23 1734   sertraline (ZOLOFT) tablet 150 mg  150 mg Oral Daily Massengill, Harrold Donath, MD   150 mg at 04/17/23 0740   SUMAtriptan (IMITREX) tablet 25 mg  25 mg Oral BID PRN [name], NP   25 mg at 04/13/23 1243   traZODone (DESYREL) tablet 50 mg  50 mg Oral QHS [name], NP   50 mg at 04/16/23 2054   Vitamin D (Ergocalciferol) (DRISDOL) 1.25 MG (50000 UNIT) capsule 50,000 Units  50,000 Units Oral Q7 days [name], NP   50,000 Units at 04/13/23 1410   Lab Results:  No results found for this or any previous visit (from the past 48 hour(s)).    Blood Alcohol level:   Lab Results  Component Value Date   ETH <10 12/19/2022   ETH <10 11/21/2019   Metabolic Disorder Labs: Lab Results  Component Value Date   HGBA1C 4.4 (L) 12/21/2022   MPG 79.58 12/21/2022   No results found for: "PROLACTIN" Lab Results  Component Value Date   CHOL 218 (H) 12/21/2022   TRIG 81 12/21/2022   HDL 53 12/21/2022   CHOLHDL 4.1 12/21/2022   VLDL 16 12/21/2022   LDLCALC 149 (H) 12/21/2022   LDLCALC 110 (H) 03/13/2011   Physical Findings: AIMS: Facial and Oral Movements Muscles of Facial Expression: None Lips and Perioral Area: None Jaw: None Tongue: None,Extremity Movements Upper (arms, wrists, hands, fingers): None Lower (legs, knees, ankles, toes): None, Trunk Movements Neck, shoulders, hips: None, Global Judgements Severity of abnormal movements overall : None Incapacitation due to abnormal movements: None Patient's awareness of abnormal movements: No Awareness, Dental Status Current problems with teeth and/or dentures?: No Does patient usually wear dentures?: No  CIWA:    COWS:    AIMS:0 Musculoskeletal: Strength & Muscle Tone: within normal limits Gait & Station: normal Patient leans: N/A  Psychiatric Specialty Exam:  Presentation  General Appearance:  Disheveled; Casual  Eye Contact: Limited to baseline Speech: Decreased amount, decreased tone and volume but at baseline Speech Volume: Decreased  Handedness: Right  Mood and Affect  Mood: Sad and depressed mainly secondary to being in the hospital with no place to go Affect: Blunt; Restricted  Thought Process  Thought Processes: Linear  Descriptions of Associations:Intact  Orientation:Full (Time, Place and Person)  Thought Content:Rumination Concrete History of Schizophrenia/Schizoaffective disorder:Yes  Duration of Psychotic Symptoms:Greater than six months  Hallucinations:Hallucinations: None    Ideas of Reference: None  Suicidal Thoughts:Suicidal  Thoughts:  No    Homicidal Thoughts:Homicidal Thoughts: No   Sensorium  Memory: Immediate Fair; Recent Fair; Remote Fair  Judgment: Fair  Insight: Fair  Chartered certified accountant: Fair  Attention Span: Fair  Recall: [employer] of Knowledge: Fair  Language: Fair  Psychomotor Activity  Psychomotor Activity: Psychomotor Activity: Normal     Assets  Assets: Communication Skills; Desire for Improvement; Leisure Time  Sleep  Sleep: Sleep: Good Number of Hours of Sleep: 8.25    Physical Exam: Physical Exam Vitals and nursing note reviewed.  HENT:     Nose: Nose normal.     Mouth/Throat:     Pharynx: Oropharynx is clear.  Eyes:     Pupils: Pupils are equal, round, and reactive to light.  Cardiovascular:     Rate and Rhythm: Normal rate.     Pulses: Normal pulses.  Pulmonary:     Effort: Pulmonary effort is normal.  Genitourinary:    Comments: Deferred Musculoskeletal:        General: Normal range of motion.     Cervical back: Normal range of motion.  Skin:    General: Skin is warm and dry.  Neurological:     General: No focal deficit present.     Mental Status: She is alert and oriented to person, place, and time.  Psychiatric:        Mood and Affect: Mood normal.        Behavior: Behavior normal.        Thought Content: Thought content normal.    Review of Systems  Constitutional:  Negative for chills, diaphoresis and fever.  HENT:  Negative for congestion, hearing loss and sore throat.   Eyes:  Negative for blurred vision.  Respiratory:  Negative for cough, shortness of breath and wheezing.   Cardiovascular:  Negative for chest pain and palpitations.  Gastrointestinal:  Negative for abdominal pain, constipation, diarrhea, heartburn, nausea and vomiting.  Genitourinary:  Negative for dysuria.  Musculoskeletal:  Negative for joint pain and myalgias.  Skin:  Negative for rash.  Neurological:  Negative for dizziness.   Psychiatric/Behavioral:  Positive for depression. Negative for hallucinations, memory loss, substance abuse and suicidal ideas. The patient is nervous/anxious and has insomnia.   All other systems reviewed and are negative.  Blood pressure 94/63, pulse (!) 104, temperature 98.3 F (36.8 C), temperature source Oral, resp. rate 16, height 4\' 11"  (1.499 m), weight 63 kg, SpO2 100%. Body mass index is 28.05 kg/m.  Treatment Plan Summary: Daily contact with patient to assess and evaluate symptoms and progress in treatment and Medication management.    Still waiting on safe disposition as patient would not be able to live independently given cognitive impairment. Recommend assisted living.    No medication side effects reported.  Because of urinary incontinence, recommended adult diapers to be worn.   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  Plan:  -Continue Imitrex 25 mg PRN BID for migraines -Continue Colace 100 mg daily for constipation -Continue Vitamin D 50.000 units weekly for bone health. -Continue Sertraline150 mg po Q daily for depression/anxiety -Continue Buspar 15 mg po bid for anxiety.  -Continue paliperidone 6 mg po qd for mood stabilization -Continue Trazodone 50 mg nightly for insomnia  -Continue Nicoderm 21 mg topically Q 24 hrs for nicotine withdrawal management. -Continue vitamin B12 1000 mcg  IM weekly for 4 weeks then monthly -Continue Melatonin 5 mg nightly for sleep -Continue Protonix EC 40 mg p.o. daily for GERD -Continue MiraLAX 17 g p.o. PRN for constipation -Continue hydroxyzine 25 mg p.o. 3 times daily as needed for anxiety -Continue Ensure nutritional shakes TID in between meals  -Previously discontinued Hydroxyzine 50 mg  nightly-hypotension in the mornings   Safety and Monitoring: Voluntary admission to inpatient psychiatric unit for safety, stabilization and treatment Daily contact with patient to assess and evaluate symptoms and progress in treatment Patient's case to be discussed in multi-disciplinary team meeting Observation Level : q15 minute checks Vital signs: q12 hours Precautions: Safety   Discharge Planning: Social work and case management to assist with discharge planning and identification of hospital follow-up needs prior to discharge Estimated LOS: Unknown at this time. Discharge Concerns: Need to establish a safety plan; Medication compliance and effectiveness Discharge Goals: Return home with outpatient referrals for mental health follow-up including medication management/psychotherapy No legal guardian, has payee  [name], MD,  04/17/2023, 9:32 AM Patient ID: Larey Brick, female   DOB: 1976/06/13, 47 y.o.   MRN: [id number]

## 2023-04-17 NOTE — [facility] Group Notes (Signed)
Spiritual care group on grief and loss facilitated by Chaplain Dyanne Carrel, Bcc and Arlyce Dice, Mdiv  Group Goal: Support / Education around grief and loss  Members engage in facilitated group support and psycho-social education.  Group Description:  Following introductions and group rules, group members engaged in facilitated group dialogue and support around topic of loss, with particular support around experiences of loss in their lives. Group Identified types of loss (relationships / self / things) and identified patterns, circumstances, and changes that precipitate losses. Reflected on thoughts / feelings around loss, normalized grief responses, and recognized variety in grief experience. Group encouraged individual reflection on safe space and on the coping skills that they are already utilizing.  Group drew on Adlerian / Rogerian and narrative framework  Patient Progress: Jeannie arrived at the end of group and did not participate.

## 2023-04-17 NOTE — Progress Notes (Signed)
   04/17/23 0800  Psych Admission Type (Psych Patients Only)  Admission Status Involuntary  Psychosocial Assessment  Patient Complaints Anxiety;Depression  Eye Contact Fair  Facial Expression Animated  Affect Appropriate to circumstance  Speech Logical/coherent  Interaction Childlike  Motor Activity Slow  Appearance/Hygiene Unremarkable  Behavior Characteristics Cooperative;Appropriate to situation  Mood Depressed;Anxious  Thought Process  Coherency WDL  Content WDL  Delusions None reported or observed  Perception WDL  Hallucination None reported or observed  Judgment Impaired  Confusion None  Danger to Self  Current suicidal ideation? Denies  Description of Suicide Plan No plan  Self-Injurious Behavior No self-injurious ideation or behavior indicators observed or expressed   Agreement Not to Harm Self Yes  Description of Agreement Verbal  Danger to Others  Danger to Others None reported or observed

## 2023-04-17 NOTE — Progress Notes (Signed)
CSW met with Guardian Ad Dierdre Searles tem [facility]) Attorney along with pt to discuss pt's current status. Pt engaged Ms. Currie appropriately. Ms. Laureen Ochs provided the pt with education concerning her role and indicates that a court hearing will be occurring on  04-22-2023. Ms. Laureen Ochs indicates that a hearing will occur to decide on Legal guardianship. During the conversation, Dr. Enedina Finner joined the conversation and provided more insight concerning, Diagnosis, Treatment and Prognosis. CSW will continue to monitor.

## 2023-04-17 NOTE — Progress Notes (Signed)
D) Pt received calm, visible, participating in milieu, and in no acute distress. Pt A & O x4. Pt denies SI, HI, A/ V H, depression, anxiety and pain at this time. A) Pt encouraged to drink fluids. Pt encouraged to come to staff with needs. Pt encouraged to attend and participate in groups. Pt encouraged to set reachable goals.  R) Pt remained safe on unit, in no acute distress, will continue to assess.     04/17/23 2000  Psych Admission Type (Psych Patients Only)  Admission Status Involuntary  Psychosocial Assessment  Patient Complaints Anxiety  Eye Contact Fair  Facial Expression Animated  Affect Appropriate to circumstance  Speech Logical/coherent  Interaction Childlike  Motor Activity Slow  Appearance/Hygiene Unremarkable  Behavior Characteristics Cooperative;Appropriate to situation  Mood Anxious;Depressed  Thought Process  Coherency WDL  Content WDL  Delusions None reported or observed  Perception WDL  Hallucination None reported or observed  Judgment Impaired  Confusion None  Danger to Self  Current suicidal ideation? Denies  Self-Injurious Behavior No self-injurious ideation or behavior indicators observed or expressed   Agreement Not to Harm Self Yes  Description of Agreement verbal  Danger to Others  Danger to Others None reported or observed

## 2023-04-17 NOTE — Plan of Care (Signed)
Problem: Activity: Goal: Interest or engagement in leisure activities will improve Outcome: Progressing Goal: Imbalance in normal sleep/wake cycle will improve Outcome: Progressing   Problem: Coping: Goal: Coping ability will improve Outcome: Progressing

## 2023-04-17 NOTE — [facility] Group Notes (Signed)
[facility] Group Notes:  (Nursing/MHT/Case Management/Adjunct)  Date:  04/17/2023  Time: 2000  Type of Therapy:   Narcotics Anonymous Meeting  Participation Level:  None  Participation Quality:  Attentive  Affect:  Appropriate  Cognitive:  Lacking  Insight:  Limited  Engagement in Group:  None  Modes of Intervention:  Clarification, Education, and Support  Summary of Progress/Problems: Pt left the room soon after the meeting began.   [name] 04/17/2023, 9:44 PM

## 2023-04-17 NOTE — Progress Notes (Signed)
Chaplain met with Yolanda [name] to provide ongoing emotional and spiritual support.  Yolanda [name] began the conversation very tearful and mentioned that her daughter didn't want to see her.  She reported SI and stated that she didn't know what to do with the feelings.  Chaplain provided listening as well as spiritual support through prayer.  [address] Bcc Pager, [phone]

## 2023-04-18 DIAGNOSIS — F251 Schizoaffective disorder, depressive type: Secondary | ICD-10-CM | POA: Diagnosis not present

## 2023-04-18 NOTE — Group Note (Signed)
[facility] LCSW Group Therapy Note   Group Date: 04/18/2023 Start Time: 1105 End Time: 1150  Type of Therapy/Topic:  Group Therapy:  Feelings about Diagnosis  Participation Level:  Did Not Attend      Description of Group:    This group will allow patients to explore their thoughts and feelings about diagnoses they have received. Patients will be guided to explore their level of understanding and acceptance of these diagnoses. Facilitator will encourage patients to process their thoughts and feelings about the reactions of others to their diagnosis, and will guide patients in identifying ways to discuss their diagnosis with significant others in their lives. This group will be process-oriented, with patients participating in exploration of their own experiences as well as giving and receiving support and challenge from other group members.   Therapeutic Goals: 1. Patient will demonstrate understanding of diagnosis as evidence by identifying two or more symptoms of the disorder:  2. Patient will be able to express two feelings regarding the diagnosis 3. Patient will demonstrate ability to communicate their needs through discussion and/or role plays      Therapeutic Modalities:   Cognitive Behavioral Therapy Brief Therapy Feelings Identification    Tajha Sammarco S Jaydee Ingman, LCSW

## 2023-04-18 NOTE — Plan of Care (Signed)
Nuse discu Problem: Education: Goal: Utilization of techniques to improve thought processes will improve Outcome: Progressing Goal: Knowledge of the prescribed therapeutic regimen will improve Outcome: Progressing   Problem: Activity: Goal: Interest or engagement in leisure activities will improve Outcome: Progressing Goal: Imbalance in normal sleep/wake cycle will improve Outcome: Progressing   Problem: Coping: Goal: Coping ability will improve Outcome: Progressing Goal: Will verbalize feelings Outcome: Progressing   Problem: Health Behavior/Discharge Planning: Goal: Ability to make decisions will improve Outcome: Progressing Goal: Compliance with therapeutic regimen will improve Outcome: Progressing   Problem: Role Relationship: Goal: Will demonstrate positive changes in social behaviors and relationships Outcome: Progressing   Problem: Safety: Goal: Ability to disclose and discuss suicidal ideas will improve Outcome: Progressing Goal: Ability to identify and utilize support systems that promote safety will improve Outcome: Progressing   Problem: Self-Concept: Goal: Will verbalize positive feelings about self Outcome: Progressing Goal: Level of anxiety will decrease Outcome: Progressing   Problem: Education: Goal: Utilization of techniques to improve thought processes will improve Outcome: Progressing Goal: Knowledge of the prescribed therapeutic regimen will improve Outcome: Progressing   Problem: Activity: Goal: Interest or engagement in leisure activities will improve Outcome: Progressing Goal: Imbalance in normal sleep/wake cycle will improve Outcome: Progressing   Problem: Coping: Goal: Coping ability will improve Outcome: Progressing Goal: Will verbalize feelings Outcome: Progressing   Problem: Health Behavior/Discharge Planning: Goal: Ability to make decisions will improve Outcome: Progressing Goal: Compliance with therapeutic regimen will  improve Outcome: Progressing   Problem: Role Relationship: Goal: Will demonstrate positive changes in social behaviors and relationships Outcome: Progressing   Problem: Safety: Goal: Ability to disclose and discuss suicidal ideas will improve Outcome: Progressing Goal: Ability to identify and utilize support systems that promote safety will improve Outcome: Progressing   Problem: Self-Concept: Goal: Will verbalize positive feelings about self Outcome: Progressing Goal: Level of anxiety will decrease Outcome: Progressing   Problem: Education: Goal: Ability to make informed decisions regarding treatment will improve Outcome: Progressing   Problem: Coping: Goal: Coping ability will improve Outcome: Progressing   Problem: Health Behavior/Discharge Planning: Goal: Identification of resources available to assist in meeting health care needs will improve Outcome: Progressing   Problem: Medication: Goal: Compliance with prescribed medication regimen will improve Outcome: Progressing   Problem: Self-Concept: Goal: Ability to disclose and discuss suicidal ideas will improve Outcome: Progressing Goal: Will verbalize positive feelings about self Outcome: Progressing   Problem: Education: Goal: Knowledge of [facility] Education information/materials will improve Outcome: Progressing Goal: Emotional status will improve Outcome: Progressing Goal: Mental status will improve Outcome: Progressing Goal: Verbalization of understanding the information provided will improve Outcome: Progressing   Problem: Activity: Goal: Interest or engagement in activities will improve Outcome: Progressing Goal: Sleeping patterns will improve Outcome: Progressing   Problem: Coping: Goal: Ability to verbalize frustrations and anger appropriately will improve Outcome: Progressing Goal: Ability to demonstrate self-control will improve Outcome: Progressing   Problem: Health  Behavior/Discharge Planning: Goal: Identification of resources available to assist in meeting health care needs will improve Outcome: Progressing Goal: Compliance with treatment plan for underlying cause of condition will improve Outcome: Progressing   Problem: Physical Regulation: Goal: Ability to maintain clinical measurements within normal limits will improve Outcome: Progressing   Problem: Safety: Goal: Periods of time without injury will increase Outcome: Progressing

## 2023-04-18 NOTE — Progress Notes (Signed)
   04/18/23 1700  Psych Admission Type (Psych Patients Only)  Admission Status Involuntary  Psychosocial Assessment  Patient Complaints Anxiety  Eye Contact Fair  Facial Expression Animated  Affect Appropriate to circumstance  Speech Logical/coherent  Interaction Childlike  Motor Activity Slow  Appearance/Hygiene Unremarkable  Behavior Characteristics Cooperative;Appropriate to situation  Mood Anxious  Thought Process  Coherency WDL  Content WDL  Delusions None reported or observed  Perception WDL  Hallucination None reported or observed  Judgment Impaired  Confusion None  Danger to Self  Current suicidal ideation? Denies  Self-Injurious Behavior No self-injurious ideation or behavior indicators observed or expressed   Agreement Not to Harm Self Yes  Description of Agreement Denies  Danger to Others  Danger to Others None reported or observed

## 2023-04-18 NOTE — Plan of Care (Signed)
Nurse discussed coping skills with patient.  

## 2023-04-18 NOTE — Plan of Care (Signed)
Problem: Education: Goal: Utilization of techniques to improve thought processes will improve 04/18/2023 1936 by [name], RN Outcome: Progressing 04/18/2023 1934 by [name], RN Outcome: Progressing 04/18/2023 1933 by [name], RN Outcome: Progressing Goal: Knowledge of the prescribed therapeutic regimen will improve 04/18/2023 1934 by [name], RN Outcome: Progressing 04/18/2023 1933 by [name], RN Outcome: Progressing   Problem: Activity: Goal: Interest or engagement in leisure activities will improve 04/18/2023 1934 by [name], RN Outcome: Progressing 04/18/2023 1933 by [name], RN Outcome: Progressing Goal: Imbalance in normal sleep/wake cycle will improve 04/18/2023 1934 by [name], RN Outcome: Progressing 04/18/2023 1933 by [name], RN Outcome: Progressing   Problem: Coping: Goal: Coping ability will improve 04/18/2023 1934 by [name], RN Outcome: Progressing 04/18/2023 1933 by [name], RN Outcome: Progressing Goal: Will verbalize feelings 04/18/2023 1934 by [name], RN Outcome: Progressing 04/18/2023 1933 by [name], RN Outcome: Progressing   Problem: Health Behavior/Discharge Planning: Goal: Ability to make decisions will improve 04/18/2023 1934 by [name], RN Outcome: Progressing 04/18/2023 1933 by [name], RN Outcome: Progressing Goal: Compliance with therapeutic regimen will improve 04/18/2023 1934 by [name], RN Outcome: Progressing 04/18/2023 1933 by [name], RN Outcome: Progressing   Problem: Role Relationship: Goal: Will demonstrate positive changes in social behaviors and relationships 04/18/2023 1934 by [name], RN Outcome: Progressing 04/18/2023 1933 by [name], RN Outcome: Progressing   Problem: Safety: Goal: Ability to disclose and discuss suicidal ideas  will improve 04/18/2023 1934 by [name], RN Outcome: Progressing 04/18/2023 1933 by [name], RN Outcome: Progressing Goal: Ability to identify and utilize support systems that promote safety will improve 04/18/2023 1934 by [name], RN Outcome: Progressing 04/18/2023 1933 by [name], RN Outcome: Progressing   Problem: Self-Concept: Goal: Will verbalize positive feelings about self 04/18/2023 1934 by [name], RN Outcome: Progressing 04/18/2023 1933 by [name], RN Outcome: Progressing Goal: Level of anxiety will decrease 04/18/2023 1934 by [name], RN Outcome: Progressing 04/18/2023 1933 by [name], RN Outcome: Progressing   Problem: Education: Goal: Utilization of techniques to improve thought processes will improve 04/18/2023 1934 by [name], RN Outcome: Progressing 04/18/2023 1933 by [name], RN Outcome: Progressing Goal: Knowledge of the prescribed therapeutic regimen will improve 04/18/2023 1934 by [name], RN Outcome: Progressing 04/18/2023 1933 by [name], RN Outcome: Progressing   Problem: Activity: Goal: Interest or engagement in leisure activities will improve 04/18/2023 1934 by [name], RN Outcome: Progressing 04/18/2023 1933 by [name], RN Outcome: Progressing Goal: Imbalance in normal sleep/wake cycle will improve 04/18/2023 1934 by [name], RN Outcome: Progressing 04/18/2023 1933 by [name], RN Outcome: Progressing   Problem: Coping: Goal: Coping ability will improve 04/18/2023 1934 by [name], RN Outcome: Progressing 04/18/2023 1933 by [name], RN Outcome: Progressing Goal: Will verbalize feelings 04/18/2023 1934 by [name], RN Outcome: Progressing 04/18/2023 1933 by [name], RN Outcome: Progressing   Problem: Health Behavior/Discharge  Planning: Goal: Ability to make decisions will improve 04/18/2023 1934 by [name], RN Outcome: Progressing 04/18/2023 1933 by [name], RN Outcome: Progressing Goal: Compliance with therapeutic regimen will improve 04/18/2023 1934 by [name], RN Outcome: Progressing 04/18/2023 1933 by [name], RN Outcome: Progressing  Problem: Role Relationship: Goal: Will demonstrate positive changes in social behaviors and relationships 04/18/2023 1934 by [name], RN Outcome: Progressing 04/18/2023 1933 by [name], RN Outcome: Progressing   Problem: Safety: Goal: Ability to disclose and discuss suicidal ideas will improve 04/18/2023 1934 by [name], RN Outcome: Progressing 04/18/2023 1933 by [name], RN Outcome: Progressing Goal: Ability to identify and utilize support systems that promote safety will improve 04/18/2023 1934 by [name], RN Outcome: Progressing 04/18/2023 1933 by [name], RN Outcome: Progressing   Problem: Self-Concept: Goal: Will verbalize positive feelings about self 04/18/2023 1934 by [name], RN Outcome: Progressing 04/18/2023 1933 by [name], RN Outcome: Progressing Goal: Level of anxiety will decrease 04/18/2023 1934 by [name], RN Outcome: Progressing 04/18/2023 1933 by [name], RN Outcome: Progressing   Problem: Education: Goal: Ability to make informed decisions regarding treatment will improve 04/18/2023 1934 by [name], RN Outcome: Progressing 04/18/2023 1933 by [name], RN Outcome: Progressing   Problem: Coping: Goal: Coping ability will improve 04/18/2023 1934 by [name], RN Outcome: Progressing 04/18/2023 1933 by [name], RN Outcome: Progressing   Problem: Health Behavior/Discharge Planning: Goal: Identification of resources available to assist in meeting health care  needs will improve 04/18/2023 1934 by [name], RN Outcome: Progressing 04/18/2023 1933 by [name], RN Outcome: Progressing   Problem: Medication: Goal: Compliance with prescribed medication regimen will improve 04/18/2023 1934 by [name], RN Outcome: Progressing 04/18/2023 1933 by [name], RN Outcome: Progressing   Problem: Self-Concept: Goal: Ability to disclose and discuss suicidal ideas will improve 04/18/2023 1934 by [name], RN Outcome: Progressing 04/18/2023 1933 by [name], RN Outcome: Progressing Goal: Will verbalize positive feelings about self 04/18/2023 1934 by [name], RN Outcome: Progressing 04/18/2023 1933 by [name], RN Outcome: Progressing   Problem: Education: Goal: Knowledge of [facility] Education information/materials will improve 04/18/2023 1934 by [name], RN Outcome: Progressing 04/18/2023 1933 by [name], RN Outcome: Progressing Goal: Emotional status will improve 04/18/2023 1934 by [name], RN Outcome: Progressing 04/18/2023 1933 by [name], RN Outcome: Progressing Goal: Mental status will improve 04/18/2023 1934 by [name], RN Outcome: Progressing 04/18/2023 1933 by [name], RN Outcome: Progressing Goal: Verbalization of understanding the information provided will improve 04/18/2023 1934 by [name], RN Outcome: Progressing 04/18/2023 1933 by [name], RN Outcome: Progressing   Problem: Activity: Goal: Interest or engagement in activities will improve 04/18/2023 1934 by [name], RN Outcome: Progressing 04/18/2023 1933 by [name], RN Outcome: Progressing Goal: Sleeping patterns will improve 04/18/2023 1934 by [name], RN Outcome: Progressing 04/18/2023 1933 by [name], RN Outcome: Progressing   Problem: Coping: Goal: Ability to  verbalize frustrations and anger appropriately will improve 04/18/2023 1934 by [name], RN Outcome: Progressing 04/18/2023 1933 by [name], RN Outcome: Progressing Goal: Ability to demonstrate self-control will improve 04/18/2023 1934 by [name], RN Outcome: Progressing 04/18/2023 1933 by [name], RN Outcome: Progressing   Problem: Health Behavior/Discharge Planning: Goal: Identification of resources available to assist in meeting health care needs will improve 04/18/2023 1934 by [name], RN Outcome: Progressing 04/18/2023 1933 by [name], RN Outcome: Progressing Goal: Compliance with treatment plan for underlying cause of condition will improve 04/18/2023 1934  by [name], RN Outcome: Progressing 04/18/2023 1933 by [name], RN Outcome: Progressing   Problem: Physical Regulation: Goal: Ability to maintain clinical measurements within normal limits will improve 04/18/2023 1934 by [name], RN Outcome: Progressing 04/18/2023 1933 by [name], RN Outcome: Progressing   Problem: Safety: Goal: Periods of time without injury will increase 04/18/2023 1934 by [name], RN Outcome: Progressing 04/18/2023 1933 by [name], RN Outcome: Progressing

## 2023-04-18 NOTE — [facility] Group Notes (Signed)
[facility] Group Notes:  (Nursing/MHT/Case Management/Adjunct)  Date:  04/18/2023  Time:  9:09 PM  Type of Therapy:   Wrap-up group  Participation Level:  Active  Participation Quality:  Appropriate  Affect:  Appropriate  Cognitive:  Appropriate  Insight:  Appropriate  Engagement in Group:  Engaged  Modes of Intervention:  Education  Summary of Progress/Problems: Pt goal to attend group. Pt reports she did attend groups today. Rated day 8/10.  Noah Delaine 04/18/2023, 9:09 PM

## 2023-04-18 NOTE — Progress Notes (Signed)
[facility] MD Progress Note  04/18/2023 11:15 AM Yolanda [name]  MRN:  [id number]  Principal Problem: Schizoaffective disorder, depressive type (HCC)  Diagnosis: Principal Problem:   Schizoaffective disorder, depressive type (HCC) Active Problems:   GERD (gastroesophageal reflux disease)   Intellectual disability   Tobacco use disorder  Reason for admission: This is the first psychiatric admission in this [facility] in 12 years for this 47 AA female with an extensive hx of mental illnesses & probable polysubstance use disorders. She is admitted to the [facility] from the [facility] hospital with complain of worsening suicidal ideations with plan to stab herself. Per chart review, patient apparently reported at the ED that she has been depressed for a while & has not been taking her mental health medications. After medical evaluation.clearance, she was transferred to the [facility] for further psychiatric evaluation/treatments.   24 hr chart review: Staff reports that patient has been unchanged.  No significant change since yesterday.  Essentially remains the same.  No PRNs needed.  No agitation noted.  Still placement issue.  Patient assessment note: Chart reviewed, the patient was interviewed.  She was quite sedated this morning and was minimally interactive.  However there is no change in her behavior.  She denies any auditory or visual hallucinations she denies any SI/HI.  She has not participated in any groups this morning.    Pt is continuing to require to be hospitalized until a placement in the community that is safe for her is found.  We continue to coordinate with CSW and DSS to achieve this goal.Patient's mental status is currently stable for continuity of care on an outpatient basis, but we continue to await placement.   Continuing all medications as listed below, as there are no indications for medication changes at this time.  No TD/EPS type symptoms found on assessment, and pt denies any feelings of  stiffness. AIMS: 0. New EKG in the context of antipsychotic medications for Qtc WNL.  Total Time spent with patient:  25 minutes  Past Psychiatric History:  See H & P  Past Medical History:  Past Medical History:  Diagnosis Date   Bipolar affect, depressed (HCC)    Constipation 08/17/2022   Depression    Falls 07/21/2022   Fracture of femoral neck, right, closed (HCC) 01/17/2022   Herpes simplex 08/22/2017   Open fracture dislocation of right elbow joint 01/17/2022    Past Surgical History:  Procedure Laterality Date   NO PAST SURGERIES     SALPINGECTOMY     Family History: History reviewed. No pertinent family history.  Family Psychiatric  History: See H & P  Social History:  Social History   Substance and Sexual Activity  Alcohol Use Yes     Social History   Substance and Sexual Activity  Drug Use Yes   Types: Cocaine, Marijuana    Social History   Socioeconomic History   Marital status: Single    Spouse name: Not on file   Number of children: Not on file   Years of education: Not on file   Highest education level: Not on file  Occupational History   Not on file  Tobacco Use   Smoking status: Every Day   Smokeless tobacco: Not on file  Substance and Sexual Activity   Alcohol use: Yes   Drug use: Yes    Types: Cocaine, Marijuana   Sexual activity: Yes  Other Topics Concern   Not on file  Social History Narrative   Not  on file   Social Determinants of Health   Financial Resource Strain: Low Risk  (09/12/2022)   Received from [facility], [facility]   Overall Financial Resource Strain (CARDIA)    Difficulty of Paying Living Expenses: Not hard at all  Food Insecurity: Patient Declined (12/20/2022)   Hunger Vital Sign    Worried About Running Out of Food in the Last Year: Patient declined    Ran Out of Food in the Last Year: Patient declined  Transportation Needs: No Transportation Needs (12/20/2022)   PRAPARE - Scientist, research (physical sciences) (Medical): No    Lack of Transportation (Non-Medical): No  Physical Activity: Not on file  Stress: No Stress Concern Present (07/17/2022)   Received from [facility], [facility] - Occupational Stress Questionnaire    Feeling of Stress : Not at all  Social Connections: Unknown (07/16/2022)   Received from [facility], [facility]   Social Network    Social Network: Not on file   Sleep: Good  Appetite:  Good  Current Medications: Current Facility-Administered Medications  Medication Dose Route Frequency Provider Last Rate Last Admin   acetaminophen (TYLENOL) tablet 650 mg  650 mg Oral Q6H PRN [name], NP   650 mg at 04/17/23 2101   alum & mag hydroxide-simeth (MAALOX/MYLANTA) 200-200-20 MG/5ML suspension 30 mL  30 mL Oral Q4H PRN [name], NP   30 mL at 01/28/23 1530   busPIRone (BUSPAR) tablet 15 mg  15 mg Oral BID [name], NP   15 mg at 04/18/23 1610   cyanocobalamin (VITAMIN B12) injection 1,000 mcg  1,000 mcg Intramuscular Q30 days Sarita Bottom, MD   1,000 mcg at 04/03/23 1237   diphenhydrAMINE (BENADRYL) capsule 50 mg  50 mg Oral TID PRN [name], NP   50 mg at 01/15/23 1211   Or   diphenhydrAMINE (BENADRYL) injection 50 mg  50 mg Intramuscular TID PRN [name], NP       docusate sodium (COLACE) capsule 100 mg  100 mg Oral Daily [name], NP   100 mg at 04/18/23 0843   feeding supplement (ENSURE ENLIVE / ENSURE PLUS) liquid 237 mL  237 mL Oral TID BM Nkwenti, Tyler Aas, NP   237 mL at 04/18/23 0935   haloperidol (HALDOL) tablet 5 mg  5 mg Oral TID PRN [name], NP   5 mg at 01/15/23 1211   Or   haloperidol lactate (HALDOL) injection 5 mg  5 mg Intramuscular TID PRN [name], NP       hydrOXYzine (ATARAX) tablet 25 mg  25 mg Oral TID PRN [name], DO   25 mg at 04/02/23 2041   LORazepam (ATIVAN) tablet 2 mg  2 mg Oral TID PRN [name], NP   2 mg at 01/15/23 1211   Or    LORazepam (ATIVAN) injection 2 mg  2 mg Intramuscular TID PRN [name], NP       magnesium hydroxide (MILK OF MAGNESIA) suspension 30 mL  30 mL Oral Daily PRN [name], NP   30 mL at 04/13/23 0814   melatonin tablet 5 mg  5 mg Oral QHS Nkwenti, Doris, NP   5 mg at 04/17/23 2100   nicotine (NICODERM CQ - dosed in mg/24 hours) patch 21 mg  21 mg Transdermal Daily [name], DO   21 mg at 03/25/23 0951   nicotine polacrilex (NICORETTE) gum 2 mg  2 mg Oral PRN [name], MD   2 mg at 04/14/23 1830   paliperidone (INVEGA) 24 hr tablet 6 mg  6 mg Oral Daily Nkwenti, Doris, NP   6 mg at 04/17/23 2103   pantoprazole (PROTONIX) EC tablet 40 mg  40 mg Oral Daily Nwoko, Nicole [facility]red I, NP   40 mg at 04/18/23 0844   polyethylene glycol (MIRALAX / GLYCOLAX) packet 17 g  17 g Oral Daily PRN [name], NP   17 g at 04/13/23 1734   sertraline (ZOLOFT) tablet 150 mg  150 mg Oral Daily Massengill, Harrold Donath, MD   150 mg at 04/18/23 0843   SUMAtriptan (IMITREX) tablet 25 mg  25 mg Oral BID PRN [name], NP   25 mg at 04/13/23 1243   traZODone (DESYREL) tablet 50 mg  50 mg Oral QHS Nkwenti, Doris, NP   50 mg at 04/17/23 2100   Vitamin D (Ergocalciferol) (DRISDOL) 1.25 MG (50000 UNIT) capsule 50,000 Units  50,000 Units Oral Q7 days [name], NP   50,000 Units at 04/13/23 1410   Lab Results:  No results found for this or any previous visit (from the past 48 hour(s)).    Blood Alcohol level:  Lab Results  Component Value Date   ETH <10 12/19/2022   ETH <10 11/21/2019   Metabolic Disorder Labs: Lab Results  Component Value Date   HGBA1C 4.4 (L) 12/21/2022   MPG 79.58 12/21/2022   No results found for: "PROLACTIN" Lab Results  Component Value Date   CHOL 218 (H) 12/21/2022   TRIG 81 12/21/2022   HDL 53 12/21/2022   CHOLHDL 4.1 12/21/2022   VLDL 16 12/21/2022   LDLCALC 149 (H) 12/21/2022   LDLCALC 110 (H) 03/13/2011   Physical Findings: AIMS: Facial and Oral  Movements Muscles of Facial Expression: None Lips and Perioral Area: None Jaw: None Tongue: None,Extremity Movements Upper (arms, wrists, hands, fingers): None Lower (legs, knees, ankles, toes): None, Trunk Movements Neck, shoulders, hips: None, Global Judgements Severity of abnormal movements overall : None Incapacitation due to abnormal movements: None Patient's awareness of abnormal movements: No Awareness, Dental Status Current problems with teeth and/or dentures?: No Does patient usually wear dentures?: No  CIWA:    COWS:    AIMS:0 Musculoskeletal: Strength & Muscle Tone: within normal limits Gait & Station: normal Patient leans: N/A  Psychiatric Specialty Exam:  Presentation  General Appearance:  Casual  Eye Contact: Limited to baseline Speech: Decreased amount, decreased tone and volume but at baseline Speech Volume: Decreased  Handedness: Right  Mood and Affect  Mood: Sad and depressed mainly secondary to being in the hospital with no place to go Affect: Constricted  Thought Process  Thought Processes: Coherent  Descriptions of Associations:Intact  Orientation:Full (Time, Place and Person)  Thought Content:Perseveration [place]crete History of Schizophrenia/Schizoaffective disorder:Yes  Duration of Psychotic Symptoms:Greater than six months  Hallucinations:Hallucinations: None    Ideas of Reference: None  Suicidal Thoughts:Suicidal Thoughts: No    Homicidal Thoughts:Homicidal Thoughts: No   Sensorium  Memory: Immediate Fair; Remote Fair; Recent Fair  Judgment: Fair  Insight: Fair  Art therapist  Concentration: Fair  Attention Span: Fair  Recall: [employer] of Knowledge: Fair  Language: Fair  Psychomotor Activity  Psychomotor Activity: Psychomotor Activity: Normal     Assets  Assets: Desire for Improvement  Sleep  Sleep: Sleep: Good Number of Hours of Sleep: 8.25    Physical Exam: Physical  Exam Vitals and nursing note reviewed.  HENT:  Nose: Nose normal.     Mouth/Throat:     Pharynx: Oropharynx is clear.  Eyes:     Pupils: Pupils are equal, round, and reactive to light.  Cardiovascular:     Rate and Rhythm: Normal rate.     Pulses: Normal pulses.  Pulmonary:     Effort: Pulmonary effort is normal.  Genitourinary:    Comments: Deferred Musculoskeletal:        General: Normal range of motion.     Cervical back: Normal range of motion.  Skin:    General: Skin is warm and dry.  Neurological:     General: No focal deficit present.     Mental Status: She is alert and oriented to person, place, and time.  Psychiatric:        Mood and Affect: Mood normal.        Behavior: Behavior normal.        Thought Content: Thought content normal.    Review of Systems  Constitutional:  Negative for chills, diaphoresis and fever.  HENT:  Negative for congestion, hearing loss and sore throat.   Eyes:  Negative for blurred vision.  Respiratory:  Negative for cough, shortness of breath and wheezing.   Cardiovascular:  Negative for chest pain and palpitations.  Gastrointestinal:  Negative for abdominal pain, constipation, diarrhea, heartburn, nausea and vomiting.  Genitourinary:  Negative for dysuria.  Musculoskeletal:  Negative for joint pain and myalgias.  Skin:  Negative for rash.  Neurological:  Negative for dizziness.  Psychiatric/Behavioral:  Positive for depression. Negative for hallucinations, memory loss, substance abuse and suicidal ideas. The patient is nervous/anxious and has insomnia.   All other systems reviewed and are negative.  Blood pressure 112/83, pulse 73, temperature 98.3 F (36.8 C), temperature source Oral, resp. rate 16, height 4\' 11"  (1.499 m), weight 63 kg, SpO2 98%. Body mass index is 28.05 kg/m.  Treatment Plan Summary: Daily contact with patient to assess and evaluate symptoms and progress in treatment and Medication management.    Still  waiting on safe disposition as patient would not be able to live independently given cognitive impairment. Recommend assisted living.    No medication side effects reported.  Because of urinary incontinence, recommended adult diapers to be worn.   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  Plan:  -Continue Imitrex 25 mg PRN BID for migraines -Continue Colace 100 mg daily for constipation -Continue Vitamin D 50.000 units weekly for bone health. -Continue Sertraline150 mg po Q daily for depression/anxiety -Continue Buspar 15 mg po bid for anxiety.  -Continue paliperidone 6 mg po qd for mood stabilization -Continue Trazodone 50 mg nightly for insomnia  -Continue Nicoderm 21 mg topically Q 24 hrs for nicotine withdrawal management. -Continue vitamin B12 1000 mcg IM weekly for 4 weeks then monthly -Continue Melatonin 5 mg nightly for sleep -Continue Protonix EC 40 mg p.o. daily for GERD -Continue MiraLAX 17 g p.o. PRN for constipation -Continue hydroxyzine 25 mg p.o. 3 times daily as needed for anxiety -Continue Ensure nutritional shakes TID in between meals  -Previously discontinued Hydroxyzine 50 mg nightly-hypotension in the mornings   Safety and Monitoring: Voluntary admission to inpatient psychiatric unit for safety, stabilization and treatment Daily contact with patient to assess and evaluate symptoms and progress in treatment Patient's case to be discussed in multi-disciplinary team meeting Observation Level :  q15 minute checks Vital signs: q12 hours Precautions: Safety   Discharge Planning: Social work and case management to assist with discharge planning and identification of hospital follow-up needs prior to discharge Estimated LOS: Unknown  at this time. Discharge Concerns: Need to establish a safety plan; Medication compliance and effectiveness Discharge Goals: Return home with outpatient referrals for mental health follow-up including medication management/psychotherapy No legal guardian, has payee  [name], MD,  04/18/2023, 11:15 AM Patient ID: Yolanda [name], female   DOB: [birth year], 47 y.o.   MRN: [id number]  Patient ID: Yolanda [name], female   DOB: [birth year], 47 y.o.   MRN: [id number]

## 2023-04-19 ENCOUNTER — Encounter (HOSPITAL_COMMUNITY): Payer: Self-pay

## 2023-04-19 DIAGNOSIS — F251 Schizoaffective disorder, depressive type: Secondary | ICD-10-CM | POA: Diagnosis not present

## 2023-04-19 NOTE — Progress Notes (Signed)
Yolanda [name] is sleeping but arouses to voice. Denies SI/HI/AVH. Took scheduled meds and has no complaints.

## 2023-04-19 NOTE — Progress Notes (Signed)
[facility] MD Progress Note  04/19/2023 12:48 PM Yolanda [name]  MRN:  [id number]  Principal Problem: Schizoaffective disorder, depressive type (HCC)  Diagnosis: Principal Problem:   Schizoaffective disorder, depressive type (HCC) Active Problems:   GERD (gastroesophageal reflux disease)   Intellectual disability   Tobacco use disorder  Reason for admission: This is the first psychiatric admission in this [facility] in 12 years for this 47 AA female with an extensive hx of mental illnesses & probable polysubstance use disorders. She is admitted to the [facility] from the [facility] hospital with complain of worsening suicidal ideations with plan to stab herself. Per chart review, patient apparently reported at the ED that she has been depressed for a while & has not been taking her mental health medications. After medical evaluation.clearance, she was transferred to the [facility] for further psychiatric evaluation/treatments.   24 hr chart review: Staff reports that patient has been unchanged.  No behavioral issues noted.  No PRNs given.  Patient is actually reading a lot and according to the staff preparing for her GED.   Patient assessment note: Chart reviewed, the patient was interviewed.  She was lying in bed but appeared alert oriented and cooperative.  She maintained good eye contact and reported that she is reading for her GED and is doing fairly well.  She denies any auditory or visual hallucinations or delusions.  She is partially compliant with groups.  In general she is unchanged and is awaiting placement.    Pt is continuing to require to be hospitalized until a placement in the community that is safe for her is found.  We continue to coordinate with CSW and DSS to achieve this goal.Patient's mental status is currently stable for continuity of care on an outpatient basis, but we continue to await placement.   Continuing all medications as listed below, as there are no indications for medication changes  at this time.  No TD/EPS type symptoms found on assessment, and pt denies any feelings of stiffness. AIMS: 0. New EKG in the context of antipsychotic medications for Qtc WNL.  Total Time spent with patient:  25 minutes  Past Psychiatric History:  See H & P  Past Medical History:  Past Medical History:  Diagnosis Date   Bipolar affect, depressed (HCC)    Constipation 08/17/2022   Depression    Falls 07/21/2022   Fracture of femoral neck, right, closed (HCC) 01/17/2022   Herpes simplex 08/22/2017   Open fracture dislocation of right elbow joint 01/17/2022    Past Surgical History:  Procedure Laterality Date   NO PAST SURGERIES     SALPINGECTOMY     Family History: History reviewed. No pertinent family history.  Family Psychiatric  History: See H & P  Social History:  Social History   Substance and Sexual Activity  Alcohol Use Yes     Social History   Substance and Sexual Activity  Drug Use Yes   Types: Cocaine, Marijuana    Social History   Socioeconomic History   Marital status: Single    Spouse name: Not on file   Number of children: Not on file   Years of education: Not on file   Highest education level: Not on file  Occupational History   Not on file  Tobacco Use   Smoking status: Every Day   Smokeless tobacco: Not on file  Substance and Sexual Activity   Alcohol use: Yes   Drug use: Yes    Types: Cocaine, Marijuana  Sexual activity: Yes  Other Topics Concern   Not on file  Social History Narrative   Not on file   Social Determinants of Health   Financial Resource Strain: Low Risk  (09/12/2022)   Received from [facility], [facility]   Overall Financial Resource Strain (CARDIA)    Difficulty of Paying Living Expenses: Not hard at all  Food Insecurity: Patient Declined (12/20/2022)   Hunger Vital Sign    Worried About Running Out of Food in the Last Year: Patient declined    Ran Out of Food in the Last Year: Patient declined  Transportation  Needs: No Transportation Needs (12/20/2022)   PRAPARE - Administrator, Civil Service (Medical): No    Lack of Transportation (Non-Medical): No  Physical Activity: Not on file  Stress: No Stress Concern Present (07/17/2022)   Received from [facility], [facility] - Occupational Stress Questionnaire    Feeling of Stress : Not at all  Social Connections: Unknown (07/16/2022)   Received from [facility], [facility]   Social Network    Social Network: Not on file   Sleep: Good  Appetite:  Good  Current Medications: Current Facility-Administered Medications  Medication Dose Route Frequency Provider Last Rate Last Admin   acetaminophen (TYLENOL) tablet 650 mg  650 mg Oral Q6H PRN [name], NP   650 mg at 04/18/23 1558   alum & mag hydroxide-simeth (MAALOX/MYLANTA) 200-200-20 MG/5ML suspension 30 mL  30 mL Oral Q4H PRN [name], NP   30 mL at 01/28/23 1530   busPIRone (BUSPAR) tablet 15 mg  15 mg Oral BID [name], NP   15 mg at 04/18/23 1718   cyanocobalamin (VITAMIN B12) injection 1,000 mcg  1,000 mcg Intramuscular Q30 days Sarita Bottom, MD   1,000 mcg at 04/03/23 1237   diphenhydrAMINE (BENADRYL) capsule 50 mg  50 mg Oral TID PRN [name], NP   50 mg at 01/15/23 1211   Or   diphenhydrAMINE (BENADRYL) injection 50 mg  50 mg Intramuscular TID PRN [name], NP       docusate sodium (COLACE) capsule 100 mg  100 mg Oral Daily [name], NP   100 mg at 04/18/23 0843   feeding supplement (ENSURE ENLIVE / ENSURE PLUS) liquid 237 mL  237 mL Oral TID BM Nkwenti, Tyler Aas, NP   237 mL at 04/18/23 2106   haloperidol (HALDOL) tablet 5 mg  5 mg Oral TID PRN [name], NP   5 mg at 01/15/23 1211   Or   haloperidol lactate (HALDOL) injection 5 mg  5 mg Intramuscular TID PRN [name], NP       hydrOXYzine (ATARAX) tablet 25 mg  25 mg Oral TID PRN [name], DO   25 mg at 04/02/23 2041   LORazepam  (ATIVAN) tablet 2 mg  2 mg Oral TID PRN [name], NP   2 mg at 01/15/23 1211   Or   LORazepam (ATIVAN) injection 2 mg  2 mg Intramuscular TID PRN [name], NP       magnesium hydroxide (MILK OF MAGNESIA) suspension 30 mL  30 mL Oral Daily PRN [name], NP   30 mL at 04/13/23 0814   melatonin tablet 5 mg  5 mg Oral QHS Nkwenti, Doris, NP   5 mg at 04/18/23 2105   nicotine (NICODERM CQ - dosed in mg/24 hours) patch 21 mg  21 mg Transdermal  Daily [name], DO   21 mg at 03/25/23 1610   nicotine polacrilex (NICORETTE) gum 2 mg  2 mg Oral PRN [name], MD   2 mg at 04/18/23 1558   paliperidone (INVEGA) 24 hr tablet 6 mg  6 mg Oral Daily [name], NP   6 mg at 04/18/23 2105   pantoprazole (PROTONIX) EC tablet 40 mg  40 mg Oral Daily Nwoko, Nicole Kindred I, NP   40 mg at 04/18/23 0844   polyethylene glycol (MIRALAX / GLYCOLAX) packet 17 g  17 g Oral Daily PRN [name], NP   17 g at 04/13/23 1734   sertraline (ZOLOFT) tablet 150 mg  150 mg Oral Daily Massengill, Harrold Donath, MD   150 mg at 04/19/23 9604   SUMAtriptan (IMITREX) tablet 25 mg  25 mg Oral BID PRN [name], NP   25 mg at 04/13/23 1243   traZODone (DESYREL) tablet 50 mg  50 mg Oral QHS [name], NP   50 mg at 04/18/23 2105   Vitamin D (Ergocalciferol) (DRISDOL) 1.25 MG (50000 UNIT) capsule 50,000 Units  50,000 Units Oral Q7 days [name], NP   50,000 Units at 04/13/23 1410   Lab Results:  No results found for this or any previous visit (from the past 48 hour(s)).    Blood Alcohol level:  Lab Results  Component Value Date   ETH <10 12/19/2022   ETH <10 11/21/2019   Metabolic Disorder Labs: Lab Results  Component Value Date   HGBA1C 4.4 (L) 12/21/2022   MPG 79.58 12/21/2022   No results found for: "PROLACTIN" Lab Results  Component Value Date   CHOL 218 (H) 12/21/2022   TRIG 81 12/21/2022   HDL 53 12/21/2022   CHOLHDL 4.1 12/21/2022   VLDL 16 12/21/2022   LDLCALC 149 (H) 12/21/2022    LDLCALC 110 (H) 03/13/2011   Physical Findings: AIMS: Facial and Oral Movements Muscles of Facial Expression: None Lips and Perioral Area: None Jaw: None Tongue: None,Extremity Movements Upper (arms, wrists, hands, fingers): None Lower (legs, knees, ankles, toes): None, Trunk Movements Neck, shoulders, hips: None, Global Judgements Severity of abnormal movements overall : None Incapacitation due to abnormal movements: None Patient's awareness of abnormal movements: No Awareness, Dental Status Current problems with teeth and/or dentures?: No Does patient usually wear dentures?: No  CIWA:    COWS:    AIMS:0 Musculoskeletal: Strength & Muscle Tone: within normal limits Gait & Station: normal Patient leans: N/A  Psychiatric Specialty Exam:  Presentation  General Appearance:  Appropriate for Environment; Casual  Eye Contact: Limited to baseline Speech: Decreased amount, decreased tone and volume but at baseline Speech Volume: Decreased  Handedness: Right  Mood and Affect  Mood: Sad and depressed mainly secondary to being in the hospital with no place to go Affect: Restricted  Thought Process  Thought Processes: Coherent  Descriptions of Associations:Intact  Orientation:Full (Time, Place and Person)  Thought Content:Logical [place] History of Schizophrenia/Schizoaffective disorder:Yes  Duration of Psychotic Symptoms:Greater than six months  Hallucinations:Hallucinations: None    Ideas of Reference: None  Suicidal Thoughts:Suicidal Thoughts: No    Homicidal Thoughts:Homicidal Thoughts: No   Sensorium  Memory: Immediate Fair; Remote Fair; Recent Fair  Judgment: Fair  Insight: Fair  Art therapist  Concentration: Fair  Attention Span: Fair  Recall: [employer] of Knowledge: Fair  Language: Fair  Psychomotor Activity  Psychomotor Activity: Psychomotor Activity: Normal     Assets  Assets: Communication Skills;  Desire for Improvement; Resilience  Sleep  Sleep: Sleep: Good Number of Hours of Sleep: 8.25    Physical Exam: Physical Exam Vitals and nursing note reviewed.  HENT:     Nose: Nose normal.     Mouth/Throat:     Pharynx: Oropharynx is clear.  Eyes:     Pupils: Pupils are equal, round, and reactive to light.  Cardiovascular:     Rate and Rhythm: Normal rate.     Pulses: Normal pulses.  Pulmonary:     Effort: Pulmonary effort is normal.  Genitourinary:    Comments: Deferred Musculoskeletal:        General: Normal range of motion.     Cervical back: Normal range of motion.  Skin:    General: Skin is warm and dry.  Neurological:     General: No focal deficit present.     Mental Status: She is alert and oriented to person, place, and time.  Psychiatric:        Mood and Affect: Mood normal.        Behavior: Behavior normal.        Thought Content: Thought content normal.    Review of Systems  Constitutional:  Negative for chills, diaphoresis and fever.  HENT:  Negative for congestion, hearing loss and sore throat.   Eyes:  Negative for blurred vision.  Respiratory:  Negative for cough, shortness of breath and wheezing.   Cardiovascular:  Negative for chest pain and palpitations.  Gastrointestinal:  Negative for abdominal pain, constipation, diarrhea, heartburn, nausea and vomiting.  Genitourinary:  Negative for dysuria.  Musculoskeletal:  Negative for joint pain and myalgias.  Skin:  Negative for rash.  Neurological:  Negative for dizziness.  Psychiatric/Behavioral:  Positive for depression. Negative for hallucinations, memory loss, substance abuse and suicidal ideas. The patient is nervous/anxious and has insomnia.   All other systems reviewed and are negative.  Blood pressure 112/83, pulse 73, temperature 98.3 F (36.8 C), temperature source Oral, resp. rate 16, height 4\' 11"  (1.499 m), weight 63 kg, SpO2 98%. Body mass index is 28.05 kg/m.  Treatment Plan  Summary: Daily contact with patient to assess and evaluate symptoms and progress in treatment and Medication management.    Still waiting on safe disposition as patient would not be able to live independently given cognitive impairment. Recommend assisted living.    No medication side effects reported.  Because of urinary incontinence, recommended adult diapers to be worn.   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  Plan:  -Continue Imitrex 25 mg PRN BID for migraines -Continue Colace 100 mg daily for constipation -Continue Vitamin D 50.000 units weekly for bone health. -Continue Sertraline150 mg po Q daily for depression/anxiety -Continue Buspar 15 mg po bid for anxiety.  -Continue paliperidone 6 mg po qd for mood stabilization -Continue Trazodone 50 mg nightly for insomnia  -Continue Nicoderm 21 mg topically Q 24 hrs for nicotine withdrawal management. -Continue vitamin B12 1000 mcg IM weekly for 4 weeks then monthly -Continue Melatonin 5 mg nightly for sleep -Continue Protonix EC 40 mg p.o. daily for GERD -Continue MiraLAX 17 g p.o. PRN for constipation -Continue hydroxyzine 25 mg p.o. 3 times daily as needed for anxiety -Continue Ensure nutritional shakes TID in between meals  -Previously discontinued Hydroxyzine 50 mg nightly-hypotension in the mornings   Safety and Monitoring: Voluntary admission to inpatient psychiatric unit for  safety, stabilization and treatment Daily contact with patient to assess and evaluate symptoms and progress in treatment Patient's case to be discussed in multi-disciplinary team meeting Observation Level : q15 minute checks Vital signs: q12 hours Precautions: Safety   Discharge Planning: Social work and case  management to assist with discharge planning and identification of hospital follow-up needs prior to discharge Estimated LOS: Unknown at this time. Discharge Concerns: Need to establish a safety plan; Medication compliance and effectiveness Discharge Goals: Return home with outpatient referrals for mental health follow-up including medication management/psychotherapy No legal guardian, has payee  [name], MD,  04/19/2023, 12:48 PM Patient ID: Yolanda [name], female   DOB: 1976-04-22, 47 y.o.   MRN: [id number]  Patient ID: Yolanda [name], female   DOB: 04/30/1976, 47 y.o.   MRN: [id number] Patient ID: Yolanda [name], female   DOB: 1976/07/01, 47 y.o.   MRN: [id number]

## 2023-04-19 NOTE — Plan of Care (Signed)
  Problem: Education: Goal: Utilization of techniques to improve thought processes will improve Outcome: Progressing   

## 2023-04-19 NOTE — [facility] Group Notes (Signed)
Psychoeducational Group Note  Date:  04/19/2023 Time:  2000  Group Topic/Focus:  Alcoholics Anonymous Meeting  Participation Level: Did Not Attend  Participation Quality:  Not Applicable  Affect:  Not Applicable  Cognitive:  Not Applicable  Insight:  Not Applicable  Engagement in Group: Not Applicable  Additional Comments:  Did not attend.   [name] 04/19/2023, 9:40 PM

## 2023-04-19 NOTE — Progress Notes (Signed)
CSW met with DSS SW ([facility]) along with LME ([facility]) Ms. Elenore Paddy and Ms. Fredric Mare and discussed potential placement in [place], [place]. "[employer] [facility]" . A tentative date will be set up to conduct a tour and inspection. Pt was present during the meeting and expressed that she is hopeful. CSW Will continue to monitor.

## 2023-04-19 NOTE — Progress Notes (Signed)
Patient is depressed. Crying stating she feels like giving up. Reports that she hasn't been able to get in touch with her daughter. Says she feels sad that her daughter hasn't been to visit her. Says she last saw her daughter about 3 weeks ago. Patient reports she had a rough day today feeling depressed and having crying spells. Patient was given PRN hydroxyzine tonight with her bedtime medications.

## 2023-04-19 NOTE — Group Note (Signed)
Date:  04/19/2023 Time:  9:55 AM  Group Topic/Focus:  Goals Group:   The focus of this group is to help patients establish daily goals to achieve during treatment and discuss how the patient can incorporate goal setting into their daily lives to aide in recovery.    Participation Level:  Did Not Attend  Participation Quality:      Affect:      Cognitive:      Insight: None  Engagement in Group:      Modes of Intervention:      Additional Comments:     [name] 04/19/2023, 9:55 AM

## 2023-04-19 NOTE — Progress Notes (Signed)

## 2023-04-19 NOTE — Progress Notes (Signed)
   04/19/23 2207  Psych Admission Type (Psych Patients Only)  Admission Status Involuntary  Psychosocial Assessment  Patient Complaints Anxiety;Crying spells;Depression;Hopelessness;Helplessness;Sadness;Worrying;Worthlessness  Eye Contact Fair  Facial Expression Anxious;Worried;Sad  Affect Anxious;Depressed;Sad  Speech Soft;Logical/coherent  Interaction Childlike  Motor Activity Slow  Appearance/Hygiene Disheveled  Behavior Characteristics Cooperative;Anxious  Mood Depressed;Anxious;Despair;Sad  Thought Process  Coherency WDL  Content WDL  Delusions None reported or observed  Perception WDL  Hallucination None reported or observed  Judgment Impaired  Confusion None  Danger to Self  Current suicidal ideation? Denies  Self-Injurious Behavior No self-injurious ideation or behavior indicators observed or expressed   Agreement Not to Harm Self Yes  Description of Agreement verbal  Danger to Others  Danger to Others None reported or observed

## 2023-04-19 NOTE — BH IP Treatment Plan (Signed)
Interdisciplinary Treatment and Diagnostic Plan Update  04/19/2023 Time of Session: 9:35am (UPDATE) Yolanda [name] MRN: [id number]  Principal Diagnosis: Schizoaffective disorder, depressive type (HCC)  Secondary Diagnoses: Principal Problem:   Schizoaffective disorder, depressive type (HCC) Active Problems:   GERD (gastroesophageal reflux disease)   Intellectual disability   Tobacco use disorder   Current Medications:  Current Facility-Administered Medications  Medication Dose Route Frequency Provider Last Rate Last Admin   acetaminophen (TYLENOL) tablet 650 mg  650 mg Oral Q6H PRN [name], NP   650 mg at 04/18/23 1558   alum & mag hydroxide-simeth (MAALOX/MYLANTA) 200-200-20 MG/5ML suspension 30 mL  30 mL Oral Q4H PRN [name], NP   30 mL at 01/28/23 1530   busPIRone (BUSPAR) tablet 15 mg  15 mg Oral BID [name], NP   15 mg at 04/18/23 1718   cyanocobalamin (VITAMIN B12) injection 1,000 mcg  1,000 mcg Intramuscular Q30 days Sarita Bottom, MD   1,000 mcg at 04/03/23 1237   diphenhydrAMINE (BENADRYL) capsule 50 mg  50 mg Oral TID PRN [name], NP   50 mg at 01/15/23 1211   Or   diphenhydrAMINE (BENADRYL) injection 50 mg  50 mg Intramuscular TID PRN [name], NP       docusate sodium (COLACE) capsule 100 mg  100 mg Oral Daily [name], NP   100 mg at 04/18/23 0843   feeding supplement (ENSURE ENLIVE / ENSURE PLUS) liquid 237 mL  237 mL Oral TID BM Nkwenti, Doris, NP   237 mL at 04/18/23 2106   haloperidol (HALDOL) tablet 5 mg  5 mg Oral TID PRN [name], NP   5 mg at 01/15/23 1211   Or   haloperidol lactate (HALDOL) injection 5 mg  5 mg Intramuscular TID PRN [name], NP       hydrOXYzine (ATARAX) tablet 25 mg  25 mg Oral TID PRN [name], DO   25 mg at 04/02/23 2041   LORazepam (ATIVAN) tablet 2 mg  2 mg Oral TID PRN [name], NP   2 mg at 01/15/23 1211   Or   LORazepam (ATIVAN) injection 2 mg  2 mg Intramuscular TID PRN  [name], NP       magnesium hydroxide (MILK OF MAGNESIA) suspension 30 mL  30 mL Oral Daily PRN [name], NP   30 mL at 04/13/23 0814   melatonin tablet 5 mg  5 mg Oral QHS Nkwenti, Doris, NP   5 mg at 04/18/23 2105   nicotine (NICODERM CQ - dosed in mg/24 hours) patch 21 mg  21 mg Transdermal Daily [name], DO   21 mg at 03/25/23 4098   nicotine polacrilex (NICORETTE) gum 2 mg  2 mg Oral PRN [name], MD   2 mg at 04/18/23 1558   paliperidone (INVEGA) 24 hr tablet 6 mg  6 mg Oral Daily Nkwenti, Tyler Aas, NP   6 mg at 04/18/23 2105   pantoprazole (PROTONIX) EC tablet 40 mg  40 mg Oral Daily Nwoko, Nicole Kindred I, NP   40 mg at 04/18/23 0844   polyethylene glycol (MIRALAX / GLYCOLAX) packet 17 g  17 g Oral Daily PRN [name], NP   17 g at 04/13/23 1734   sertraline (ZOLOFT) tablet 150 mg  150 mg Oral Daily Massengill, Harrold Donath, MD   150 mg at 04/19/23 0918   SUMAtriptan (IMITREX) tablet 25 mg  25 mg Oral BID PRN [name], NP   25  mg at 04/13/23 1243   traZODone (DESYREL) tablet 50 mg  50 mg Oral QHS [name], NP   50 mg at 04/18/23 2105   Vitamin D (Ergocalciferol) (DRISDOL) 1.25 MG (50000 UNIT) capsule 50,000 Units  50,000 Units Oral Q7 days [name], NP   50,000 Units at 04/13/23 1410   PTA Medications: Medications Prior to Admission  Medication Sig Dispense Refill Last Dose   busPIRone (BUSPAR) 15 MG tablet Take 15 mg by mouth 2 (two) times daily. (Patient not taking: Reported on 12/19/2022)      paliperidone (INVEGA SUSTENNA) 156 MG/ML SUSY injection Inject 156 mg into the muscle once. (Patient not taking: Reported on 12/19/2022)      sertraline (ZOLOFT) 50 MG tablet Take 150 mg by mouth daily. (Patient not taking: Reported on 12/19/2022)      traZODone (DESYREL) 100 MG tablet Take 100 mg by mouth at bedtime as needed for sleep. (Patient not taking: Reported on 11/12/2022)       Patient Stressors: Medication change or noncompliance    Patient Strengths:  Forensic psychologist fund of knowledge   Treatment Modalities: Medication Management, Group therapy, Case management,  1 to 1 session with clinician, Psychoeducation, Recreational therapy.   Physician Treatment Plan for Primary Diagnosis: Schizoaffective disorder, depressive type (HCC) Long Term Goal(s): Improvement in symptoms so as ready for discharge   Short Term Goals: Ability to identify and develop effective coping behaviors will improve Ability to maintain clinical measurements within normal limits will improve Compliance with prescribed medications will improve Ability to identify triggers associated with substance abuse/mental health issues will improve Ability to identify changes in lifestyle to reduce recurrence of condition will improve Ability to verbalize feelings will improve Ability to disclose and discuss suicidal ideas Ability to demonstrate self-control will improve  Medication Management: Evaluate patient's response, side effects, and tolerance of medication regimen.  Therapeutic Interventions: 1 to 1 sessions, Unit Group sessions and Medication administration.  Evaluation of Outcomes: Progressing  Physician Treatment Plan for Secondary Diagnosis: Principal Problem:   Schizoaffective disorder, depressive type (HCC) Active Problems:   GERD (gastroesophageal reflux disease)   Intellectual disability   Tobacco use disorder  Long Term Goal(s): Improvement in symptoms so as ready for discharge   Short Term Goals: Ability to identify and develop effective coping behaviors will improve Ability to maintain clinical measurements within normal limits will improve Compliance with prescribed medications will improve Ability to identify triggers associated with substance abuse/mental health issues will improve Ability to identify changes in lifestyle to reduce recurrence of condition will improve Ability to verbalize feelings will improve Ability to disclose  and discuss suicidal ideas Ability to demonstrate self-control will improve     Medication Management: Evaluate patient's response, side effects, and tolerance of medication regimen.  Therapeutic Interventions: 1 to 1 sessions, Unit Group sessions and Medication administration.  Evaluation of Outcomes: Progressing   RN Treatment Plan for Primary Diagnosis: Schizoaffective disorder, depressive type (HCC) Long Term Goal(s): Knowledge of disease and therapeutic regimen to maintain health will improve  Short Term Goals: Ability to remain free from injury will improve, Ability to verbalize frustration and anger appropriately will improve, Ability to participate in decision making will improve, Ability to verbalize feelings will improve, Ability to identify and develop effective coping behaviors will improve, and Compliance with prescribed medications will improve  Medication Management: RN will administer medications as ordered by provider, will assess and evaluate patient's response and provide education to patient for prescribed medication.  RN will report any adverse and/or side effects to prescribing provider.  Therapeutic Interventions: 1 on 1 counseling sessions, Psychoeducation, Medication administration, Evaluate responses to treatment, Monitor vital signs and CBGs as ordered, Perform/monitor CIWA, COWS, AIMS and Fall Risk screenings as ordered, Perform wound care treatments as ordered.  Evaluation of Outcomes: Progressing   LCSW Treatment Plan for Primary Diagnosis: Schizoaffective disorder, depressive type (HCC) Long Term Goal(s): Safe transition to appropriate next level of care at discharge, Engage patient in therapeutic group addressing interpersonal concerns.  Short Term Goals: Engage patient in aftercare planning with referrals and resources, Increase social support, Increase emotional regulation, Facilitate acceptance of mental health diagnosis and concerns, Identify triggers  associated with mental health/substance abuse issues, and Increase skills for wellness and recovery  Therapeutic Interventions: Assess for all discharge needs, 1 to 1 time with Social worker, Explore available resources and support systems, Assess for adequacy in community support network, Educate family and significant other(s) on suicide prevention, Complete Psychosocial Assessment, Interpersonal group therapy.  Evaluation of Outcomes: Progressing   Progress in Treatment: Attending groups: Yes. Participating in groups: Yes. Taking medication as prescribed: Yes. Toleration medication: Yes. Family/Significant other contact made: Yes, individual(s) contacted:  Tamera Reason Patient understands diagnosis: Yes. Discussing patient identified problems/goals with staff: Yes. Medical problems stabilized or resolved: Yes. Denies suicidal/homicidal ideation: Yes. Issues/concerns per patient self-inventory: Yes. Other: N/A   New problem(s) identified: No, Describe:  None Reported   New Short Term/Long Term Goal(s):stabilization, elimination of SI thoughts, development of comprehensive mental wellness plan.  medication    Patient Goals:  Coping Skills/Housing   Discharge Plan or Barriers: CSW will continue to follow and assess for appropriate referrals and possible discharge planning.    Reason for Continuation of Hospitalization: Depression Medication stabilization Suicidal ideation   Estimated Length of Stay: Pt is awaiting placement for group home  Last 3 [place] Suicide Severity Risk Score: Flowsheet Row Admission (Current) [facility] from 12/19/2022 in [facility] at [facility] ED from 11/12/2022 in [facility] Emergency Department at [facility]  C-SSRS RISK CATEGORY High Risk High Risk Moderate Risk       Last [facility] 2/9 Scores:     No data to display          Scribe for Treatment  Team: Beather Arbour 04/19/2023 3:24 PM

## 2023-04-20 DIAGNOSIS — F251 Schizoaffective disorder, depressive type: Secondary | ICD-10-CM | POA: Diagnosis not present

## 2023-04-20 NOTE — Plan of Care (Signed)
Problem: Education: Goal: Utilization of techniques to improve thought processes will improve Outcome: Progressing Goal: Knowledge of the prescribed therapeutic regimen will improve Outcome: Progressing   Problem: Activity: Goal: Interest or engagement in leisure activities will improve Outcome: Progressing Goal: Imbalance in normal sleep/wake cycle will improve Outcome: Progressing

## 2023-04-20 NOTE — Progress Notes (Signed)
[facility] MD Progress Note  04/20/2023 11:37 AM Yolanda [name]  MRN:  [id number]  Principal Problem: Schizoaffective disorder, depressive type (HCC)  Diagnosis: Principal Problem:   Schizoaffective disorder, depressive type (HCC) Active Problems:   GERD (gastroesophageal reflux disease)   Intellectual disability   Tobacco use disorder  Reason for admission: This is the first psychiatric admission in this [facility] in 12 years for this 47 AA female with an extensive hx of mental illnesses & probable polysubstance use disorders. She is admitted to the [facility] from the [facility] hospital with complain of worsening suicidal ideations with plan to stab herself. Per chart review, patient apparently reported at the ED that she has been depressed for a while & has not been taking her mental health medications. After medical evaluation.clearance, she was transferred to the [facility] for further psychiatric evaluation/treatments.   24 hr chart review: Staff reports that patient has been unchanged.  Patient continues to be cooperative and there is no significant change.  She continues to work on her GED but staff reports that her depressions tend to fluctuate and today she is a little bit more withdrawn.  Patient assessment note: Chart reviewed, the patient was interviewed.  No significant change since yesterday.  Mental status examination he is essentially stable.  She does endorse some depression and some frustration but reports that she talked to her counselor yesterday and that she is hopeful of getting some placement done in the next couple of weeks.    Pt is continuing to require to be hospitalized until a placement in the community that is safe for her is found.  We continue to coordinate with CSW and DSS to achieve this goal.Patient's mental status is currently stable for continuity of care on an outpatient basis, but we continue to await placement.   Continuing all medications as listed below, as there are no  indications for medication changes at this time.  No TD/EPS type symptoms found on assessment, and pt denies any feelings of stiffness. AIMS: 0. New EKG in the context of antipsychotic medications for Qtc WNL.  Total Time spent with patient:  25 minutes  Past Psychiatric History:  See H & P  Past Medical History:  Past Medical History:  Diagnosis Date   Bipolar affect, depressed (HCC)    Constipation 08/17/2022   Depression    Falls 07/21/2022   Fracture of femoral neck, right, closed (HCC) 01/17/2022   Herpes simplex 08/22/2017   Open fracture dislocation of right elbow joint 01/17/2022    Past Surgical History:  Procedure Laterality Date   NO PAST SURGERIES     SALPINGECTOMY     Family History: History reviewed. No pertinent family history.  Family Psychiatric  History: See H & P  Social History:  Social History   Substance and Sexual Activity  Alcohol Use Yes     Social History   Substance and Sexual Activity  Drug Use Yes   Types: Cocaine, Marijuana    Social History   Socioeconomic History   Marital status: Single    Spouse name: Not on file   Number of children: Not on file   Years of education: Not on file   Highest education level: Not on file  Occupational History   Not on file  Tobacco Use   Smoking status: Every Day   Smokeless tobacco: Not on file  Substance and Sexual Activity   Alcohol use: Yes   Drug use: Yes    Types: Cocaine, Marijuana  Sexual activity: Yes  Other Topics Concern   Not on file  Social History Narrative   Not on file   Social Determinants of Health   Financial Resource Strain: Low Risk  (09/12/2022)   Received from [facility], [facility]   Overall Financial Resource Strain (CARDIA)    Difficulty of Paying Living Expenses: Not hard at all  Food Insecurity: Patient Declined (12/20/2022)   Hunger Vital Sign    Worried About Running Out of Food in the Last Year: Patient declined    Ran Out of Food in the Last  Year: Patient declined  Transportation Needs: No Transportation Needs (12/20/2022)   PRAPARE - Administrator, Civil Service (Medical): No    Lack of Transportation (Non-Medical): No  Physical Activity: Not on file  Stress: No Stress Concern Present (07/17/2022)   Received from [facility], [facility] of Occupational Health - Occupational Stress Questionnaire    Feeling of Stress : Not at all  Social Connections: Unknown (07/16/2022)   Received from [facility], [facility]   Social Network    Social Network: Not on file   Sleep: Good  Appetite:  Good  Current Medications: Current Facility-Administered Medications  Medication Dose Route Frequency Provider Last Rate Last Admin   acetaminophen (TYLENOL) tablet 650 mg  650 mg Oral Q6H PRN [name], NP   650 mg at 04/18/23 1558   alum & mag hydroxide-simeth (MAALOX/MYLANTA) 200-200-20 MG/5ML suspension 30 mL  30 mL Oral Q4H PRN [name], NP   30 mL at 01/28/23 1530   busPIRone (BUSPAR) tablet 15 mg  15 mg Oral BID [name], NP   15 mg at 04/20/23 0916   cyanocobalamin (VITAMIN B12) injection 1,000 mcg  1,000 mcg Intramuscular Q30 days Sarita Bottom, MD   1,000 mcg at 04/03/23 1237   diphenhydrAMINE (BENADRYL) capsule 50 mg  50 mg Oral TID PRN [name], NP   50 mg at 01/15/23 1211   Or   diphenhydrAMINE (BENADRYL) injection 50 mg  50 mg Intramuscular TID PRN [name], NP       docusate sodium (COLACE) capsule 100 mg  100 mg Oral Daily Nkwenti, Tyler Aas, NP   100 mg at 04/20/23 0916   feeding supplement (ENSURE ENLIVE / ENSURE PLUS) liquid 237 mL  237 mL Oral TID BM Nkwenti, Doris, NP   237 mL at 04/19/23 2014   haloperidol (HALDOL) tablet 5 mg  5 mg Oral TID PRN [name], NP   5 mg at 01/15/23 1211   Or   haloperidol lactate (HALDOL) injection 5 mg  5 mg Intramuscular TID PRN [name], NP       hydrOXYzine (ATARAX) tablet 25 mg  25 mg Oral TID PRN [name], DO    25 mg at 04/19/23 2015   LORazepam (ATIVAN) tablet 2 mg  2 mg Oral TID PRN [name], NP   2 mg at 01/15/23 1211   Or   LORazepam (ATIVAN) injection 2 mg  2 mg Intramuscular TID PRN [name], NP       magnesium hydroxide (MILK OF MAGNESIA) suspension 30 mL  30 mL Oral Daily PRN [name], NP   30 mL at 04/13/23 0814   melatonin tablet 5 mg  5 mg Oral QHS Nkwenti, Doris, NP   5 mg at 04/19/23 2015   nicotine (NICODERM CQ - dosed in mg/24 hours) patch 21 mg  21 mg Transdermal  Daily [name], DO   21 mg at 03/25/23 6045   nicotine polacrilex (NICORETTE) gum 2 mg  2 mg Oral PRN [name], MD   2 mg at 04/18/23 1558   paliperidone (INVEGA) 24 hr tablet 6 mg  6 mg Oral Daily Nkwenti, Tyler Aas, NP   6 mg at 04/19/23 2015   pantoprazole (PROTONIX) EC tablet 40 mg  40 mg Oral Daily [name], NP   40 mg at 04/20/23 0917   polyethylene glycol (MIRALAX / GLYCOLAX) packet 17 g  17 g Oral Daily PRN [name], NP   17 g at 04/13/23 1734   sertraline (ZOLOFT) tablet 150 mg  150 mg Oral Daily Massengill, Harrold Donath, MD   150 mg at 04/20/23 4098   SUMAtriptan (IMITREX) tablet 25 mg  25 mg Oral BID PRN [name], NP   25 mg at 04/13/23 1243   traZODone (DESYREL) tablet 50 mg  50 mg Oral QHS Nkwenti, Tyler Aas, NP   50 mg at 04/19/23 2015   Vitamin D (Ergocalciferol) (DRISDOL) 1.25 MG (50000 UNIT) capsule 50,000 Units  50,000 Units Oral Q7 days [name], NP   50,000 Units at 04/13/23 1410   Lab Results:  No results found for this or any previous visit (from the past 48 hour(s)).    Blood Alcohol level:  Lab Results  Component Value Date   ETH <10 12/19/2022   ETH <10 11/21/2019   Metabolic Disorder Labs: Lab Results  Component Value Date   HGBA1C 4.4 (L) 12/21/2022   MPG 79.58 12/21/2022   No results found for: "PROLACTIN" Lab Results  Component Value Date   CHOL 218 (H) 12/21/2022   TRIG 81 12/21/2022   HDL 53 12/21/2022   CHOLHDL 4.1 12/21/2022   VLDL 16  12/21/2022   LDLCALC 149 (H) 12/21/2022   LDLCALC 110 (H) 03/13/2011   Physical Findings: AIMS: Facial and Oral Movements Muscles of Facial Expression: None Lips and Perioral Area: None Jaw: None Tongue: None,Extremity Movements Upper (arms, wrists, hands, fingers): None Lower (legs, knees, ankles, toes): None, Trunk Movements Neck, shoulders, hips: None, Global Judgements Severity of abnormal movements overall : None Incapacitation due to abnormal movements: None Patient's awareness of abnormal movements: No Awareness, Dental Status Current problems with teeth and/or dentures?: No Does patient usually wear dentures?: No  CIWA:    COWS:    AIMS:0 Musculoskeletal: Strength & Muscle Tone: within normal limits Gait & Station: normal Patient leans: N/A  Psychiatric Specialty Exam:  Presentation  General Appearance:  Casual; Disheveled  Eye Contact: Limited to baseline Speech: Decreased amount, decreased tone and volume but at baseline Speech Volume: Decreased  Handedness: Right  Mood and Affect  Mood: Sad and depressed mainly secondary to being in the hospital with no place to go Affect: Restricted  Thought Process  Thought Processes: Linear  Descriptions of Associations:Intact  Orientation:Full (Time, Place and Person)  Thought Content:Perseveration Concrete History of Schizophrenia/Schizoaffective disorder:Yes  Duration of Psychotic Symptoms:Greater than six months  Hallucinations:Hallucinations: None    Ideas of Reference: None  Suicidal Thoughts:Suicidal Thoughts: No    Homicidal Thoughts:Homicidal Thoughts: No   Sensorium  Memory: Immediate Fair; Recent Fair; Remote Fair  Judgment: Fair  Insight: Fair  Art therapist  Concentration: Good  Attention Span: Good  Recall: Good  Fund of Knowledge: Good  Language: Good  Psychomotor Activity  Psychomotor Activity: Psychomotor Activity: Normal     Assets   Assets: Desire for Improvement; Communication Skills; Resilience  Sleep  Sleep: Sleep: Good Number of Hours of Sleep: 8.25    Physical Exam: Physical Exam Vitals and nursing note reviewed.  HENT:     Nose: Nose normal.     Mouth/Throat:     Pharynx: Oropharynx is clear.  Eyes:     Pupils: Pupils are equal, round, and reactive to light.  Cardiovascular:     Rate and Rhythm: Normal rate.     Pulses: Normal pulses.  Pulmonary:     Effort: Pulmonary effort is normal.  Genitourinary:    Comments: Deferred Musculoskeletal:        General: Normal range of motion.     Cervical back: Normal range of motion.  Skin:    General: Skin is warm and dry.  Neurological:     General: No focal deficit present.     Mental Status: She is alert and oriented to person, place, and time.  Psychiatric:        Mood and Affect: Mood normal.        Behavior: Behavior normal.        Thought Content: Thought content normal.    Review of Systems  Constitutional:  Negative for chills, diaphoresis and fever.  HENT:  Negative for congestion, hearing loss and sore throat.   Eyes:  Negative for blurred vision.  Respiratory:  Negative for cough, shortness of breath and wheezing.   Cardiovascular:  Negative for chest pain and palpitations.  Gastrointestinal:  Negative for abdominal pain, constipation, diarrhea, heartburn, nausea and vomiting.  Genitourinary:  Negative for dysuria.  Musculoskeletal:  Negative for joint pain and myalgias.  Skin:  Negative for rash.  Neurological:  Negative for dizziness.  Psychiatric/Behavioral:  Positive for depression. Negative for hallucinations, memory loss, substance abuse and suicidal ideas. The patient is nervous/anxious and has insomnia.   All other systems reviewed and are negative.  Blood pressure 105/72, pulse 80, temperature 98.3 F (36.8 C), temperature source Oral, resp. rate 16, height 4\' 11"  (1.499 m), weight 63 kg, SpO2 100%. Body mass index is 28.05  kg/m.  Treatment Plan Summary: Daily contact with patient to assess and evaluate symptoms and progress in treatment and Medication management.    Still waiting on safe disposition as patient would not be able to live independently given cognitive impairment. Recommend assisted living.    No medication side effects reported.  Because of urinary incontinence, recommended adult diapers to be worn.   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  Plan:  -Continue Imitrex 25 mg PRN BID for migraines -Continue Colace 100 mg daily for constipation -Continue Vitamin D 50.000 units weekly for bone health. -Continue Sertraline150 mg po Q daily for depression/anxiety -Continue Buspar 15 mg po bid for anxiety.  -Continue paliperidone 6 mg po qd for mood stabilization -Continue Trazodone 50 mg nightly for insomnia  -Continue Nicoderm 21 mg topically Q 24 hrs for nicotine withdrawal management. -Continue vitamin B12 1000 mcg IM weekly for 4 weeks then monthly -Continue Melatonin 5 mg nightly for sleep -Continue Protonix EC 40 mg p.o. daily for GERD -Continue MiraLAX 17 g p.o. PRN for constipation -Continue hydroxyzine 25 mg p.o. 3 times daily as needed for anxiety -Continue Ensure nutritional shakes TID in between meals  -Previously discontinued Hydroxyzine 50 mg nightly-hypotension in the mornings   Safety and Monitoring: Voluntary admission to inpatient psychiatric unit for safety, stabilization  and treatment Daily contact with patient to assess and evaluate symptoms and progress in treatment Patient's case to be discussed in multi-disciplinary team meeting Observation Level : q15 minute checks Vital signs: q12 hours Precautions: Safety   Discharge  Planning: Social work and case management to assist with discharge planning and identification of hospital follow-up needs prior to discharge Estimated LOS: Unknown at this time. Discharge Concerns: Need to establish a safety plan; Medication compliance and effectiveness Discharge Goals: Return home with outpatient referrals for mental health follow-up including medication management/psychotherapy No legal guardian, has payee  [name], MD,  04/20/2023, 11:37 AM Patient ID: Yolanda [name], female   DOB: [birth year], 47 y.o.   MRN: [id number]  Patient ID: Yolanda [name], female   DOB: 09-13-1975, 47 y.o.   MRN: [id number] Patient ID: Yolanda [name], female   DOB: Jul 08, 1976, 47 y.o.   MRN: [id number] Patient ID: Yolanda [name], female   DOB: 1975-07-25, 47 y.o.   MRN: [id number]

## 2023-04-20 NOTE — Progress Notes (Signed)
Marg appears sad and intermittent tearful. She is sleeping but arouses easily. She is concerned about her placement and says she hasn't seen her daughter in several days. She is calm and cooperative. Her vital signs are currently stable and she voices no complaints She denies SI/HI/AVH or self harm.

## 2023-04-20 NOTE — Progress Notes (Signed)
   04/20/23 2346  Psych Admission Type (Psych Patients Only)  Admission Status Involuntary  Psychosocial Assessment  Patient Complaints Depression  Eye Contact Poor  Facial Expression Anxious;Sad  Affect Anxious;Depressed  Speech Logical/coherent;Soft  Interaction Childlike  Motor Activity Shuffling;Slow  Appearance/Hygiene Disheveled  Behavior Characteristics Cooperative;Appropriate to situation  Mood Depressed;Pleasant  Thought Process  Coherency WDL  Content Preoccupation  Delusions None reported or observed  Perception WDL  Hallucination None reported or observed  Judgment Poor  Confusion Mild  Danger to Self  Current suicidal ideation? Denies  Agreement Not to Harm Self Yes  Description of Agreement verbal  Danger to Others  Danger to Others None reported or observed

## 2023-04-20 NOTE — Group Note (Signed)
Date:  04/20/2023 Time:  11:34 PM  Group Topic/Focus:  Wrap-Up Group:   The focus of this group is to help patients review their daily goal of treatment and discuss progress on daily workbooks.    Participation Level:  Active  Participation Quality:  Appropriate and Sharing  Affect:  Appropriate  Cognitive:  Appropriate  Insight: Appropriate and Improving  Engagement in Group:  Engaged and Improving  Modes of Intervention:  Socialization  Additional Comments:  The patient stated that she had an "okay day". The patient shared the things that she did today. The patient stated that she had a good visit with visitor. The patient rated her day a 8/10. The patient participated in the group activity.   Kennieth Francois 04/20/2023, 11:34 PM

## 2023-04-21 DIAGNOSIS — F251 Schizoaffective disorder, depressive type: Secondary | ICD-10-CM | POA: Diagnosis not present

## 2023-04-21 NOTE — Plan of Care (Signed)
  Problem: Education: Goal: Utilization of techniques to improve thought processes will improve Outcome: Progressing Goal: Knowledge of the prescribed therapeutic regimen will improve Outcome: Progressing   Problem: Activity: Goal: Interest or engagement in leisure activities will improve Outcome: Progressing Goal: Imbalance in normal sleep/wake cycle will improve Outcome: Progressing   

## 2023-04-21 NOTE — Progress Notes (Signed)
   04/21/23 1000  Psych Admission Type (Psych Patients Only)  Admission Status Involuntary  Psychosocial Assessment  Patient Complaints Depression  Eye Contact Poor  Facial Expression Sad;Worried  Affect Appropriate to circumstance  Speech Soft;Logical/coherent  Interaction Childlike  Motor Activity Slow  Appearance/Hygiene Disheveled  Behavior Characteristics Cooperative;Appropriate to situation  Mood Depressed;Pleasant  Thought Process  Coherency WDL  Content Preoccupation  Delusions None reported or observed  Perception WDL  Hallucination None reported or observed  Judgment Poor  Confusion None  Danger to Self  Current suicidal ideation? Denies  Agreement Not to Harm Self Yes  Description of Agreement agreed to contact staff before acting on harmful thoughts  Danger to Others  Danger to Others None reported or observed

## 2023-04-21 NOTE — [facility] Group Notes (Signed)
[facility] Group Notes:  (Nursing/MHT/Case Management/Adjunct)  Date:  04/21/2023  Time:  9:55 PM  Type of Therapy:   Wrap-up group  Participation Level:  Active  Participation Quality:  Appropriate  Affect:  Appropriate  Cognitive:  Appropriate  Insight:  Appropriate  Engagement in Group:  Engaged  Modes of Intervention:  Education  Summary of Progress/Problems: Pt goal to be D/C. Rated day 8/10.  Noah Delaine 04/21/2023, 9:55 PM

## 2023-04-21 NOTE — [facility] Group Notes (Signed)
Type of Therapy and Topic:  Group Therapy: Gratitude  Participation Level:  None   Description of Group:   In this group, patients shared and discussed the importance of acknowledging the elements in their lives for which they are grateful and how this can positively impact their mood.  The group discussed how bringing the positive elements of their lives to the forefront of their minds can help with recovery from any illness, physical or mental.  An exercise was done as a group in which a list was made of gratitude items in order to encourage participants to consider other potential positives in their lives.  Therapeutic Goals: Patients will identify one or more item for which they are grateful in each of 6 categories:  people, experiences, things, places, skills, and other. Patients will discuss how it is possible to seek out gratitude in even bad situations. Patients will explore other possible items of gratitude that they could remember.   Summary of Patient Progress:  NA  Therapeutic Modalities:   Solution-Focused Therapy Activity

## 2023-04-21 NOTE — Plan of Care (Signed)
Problem: Education: Goal: Utilization of techniques to improve thought processes will improve Outcome: Progressing Goal: Knowledge of the prescribed therapeutic regimen will improve Outcome: Progressing

## 2023-04-21 NOTE — Progress Notes (Signed)
[facility] MD Progress Note  04/21/2023 11:08 AM Yolanda [name]  MRN:  [id number]  Principal Problem: Schizoaffective disorder, depressive type (HCC)  Diagnosis: Principal Problem:   Schizoaffective disorder, depressive type (HCC) Active Problems:   GERD (gastroesophageal reflux disease)   Intellectual disability   Tobacco use disorder  Reason for admission: This is the first psychiatric admission in this [facility] in 12 years for this 2 AA female with an extensive hx of mental illnesses & probable polysubstance use disorders. She is admitted to the [facility] from the [facility] hospital with complain of worsening suicidal ideations with plan to stab herself. Per chart review, patient apparently reported at the ED that she has been depressed for a while & has not been taking her mental health medications. After medical evaluation.clearance, she was transferred to the [facility] for further psychiatric evaluation/treatments.   24 hr chart review: Staff reports no change in the patient.  She is compliant with medications.  She slept 9 hours.  She continues to work on her books and prepare for her GED.  She is contracting for safety.  Patient assessment note: Chart reviewed, the patient was interviewed.  As usual she is either laying in bed reading a book or attending groups.  She remains disheveled and somewhat withdrawn but is fairly pleasant and cooperative when asked specific questions.  She is able to answer.  Her mental status is currently stable and no medication changes have been done at all.   Pt is continuing to require to be hospitalized until a placement in the community that is safe for her is found.  We continue to coordinate with CSW and DSS to achieve this goal.Patient's mental status is currently stable for continuity of care on an outpatient basis, but we continue to await placement.   Continuing all medications as listed below, as there are no indications for medication changes at this time.  No  TD/EPS type symptoms found on assessment, and pt denies any feelings of stiffness. AIMS: 0. New EKG in the context of antipsychotic medications for Qtc WNL.  Total Time spent with patient:  25 minutes  Past Psychiatric History:  See H & P  Past Medical History:  Past Medical History:  Diagnosis Date   Bipolar affect, depressed (HCC)    Constipation 08/17/2022   Depression    Falls 07/21/2022   Fracture of femoral neck, right, closed (HCC) 01/17/2022   Herpes simplex 08/22/2017   Open fracture dislocation of right elbow joint 01/17/2022    Past Surgical History:  Procedure Laterality Date   NO PAST SURGERIES     SALPINGECTOMY     Family History: History reviewed. No pertinent family history.  Family Psychiatric  History: See H & P  Social History:  Social History   Substance and Sexual Activity  Alcohol Use Yes     Social History   Substance and Sexual Activity  Drug Use Yes   Types: Cocaine, Marijuana    Social History   Socioeconomic History   Marital status: Single    Spouse name: Not on file   Number of children: Not on file   Years of education: Not on file   Highest education level: Not on file  Occupational History   Not on file  Tobacco Use   Smoking status: Every Day   Smokeless tobacco: Not on file  Substance and Sexual Activity   Alcohol use: Yes   Drug use: Yes    Types: Cocaine, Marijuana   Sexual  activity: Yes  Other Topics Concern   Not on file  Social History Narrative   Not on file   Social Determinants of Health   Financial Resource Strain: Low Risk  (09/12/2022)   Received from [facility], [facility]   Overall Financial Resource Strain (CARDIA)    Difficulty of Paying Living Expenses: Not hard at all  Food Insecurity: Patient Declined (12/20/2022)   Hunger Vital Sign    Worried About Running Out of Food in the Last Year: Patient declined    Ran Out of Food in the Last Year: Patient declined  Transportation Needs: No  Transportation Needs (12/20/2022)   PRAPARE - Administrator, Civil Service (Medical): No    Lack of Transportation (Non-Medical): No  Physical Activity: Not on file  Stress: No Stress Concern Present (07/17/2022)   Received from [facility], [facility] - Occupational Stress Questionnaire    Feeling of Stress : Not at all  Social Connections: Unknown (07/16/2022)   Received from [facility], [facility]   Social Network    Social Network: Not on file   Sleep: Good  Appetite:  Good  Current Medications: Current Facility-Administered Medications  Medication Dose Route Frequency Provider Last Rate Last Admin   acetaminophen (TYLENOL) tablet 650 mg  650 mg Oral Q6H PRN [name], NP   650 mg at 04/18/23 1558   alum & mag hydroxide-simeth (MAALOX/MYLANTA) 200-200-20 MG/5ML suspension 30 mL  30 mL Oral Q4H PRN [name], NP   30 mL at 01/28/23 1530   busPIRone (BUSPAR) tablet 15 mg  15 mg Oral BID [name], NP   15 mg at 04/21/23 0817   cyanocobalamin (VITAMIN B12) injection 1,000 mcg  1,000 mcg Intramuscular Q30 days Sarita Bottom, MD   1,000 mcg at 04/03/23 1237   diphenhydrAMINE (BENADRYL) capsule 50 mg  50 mg Oral TID PRN [name], NP   50 mg at 01/15/23 1211   Or   diphenhydrAMINE (BENADRYL) injection 50 mg  50 mg Intramuscular TID PRN [name], NP       docusate sodium (COLACE) capsule 100 mg  100 mg Oral Daily Nkwenti, Tyler Aas, NP   100 mg at 04/21/23 0817   feeding supplement (ENSURE ENLIVE / ENSURE PLUS) liquid 237 mL  237 mL Oral TID BM Nkwenti, Doris, NP   237 mL at 04/21/23 1026   haloperidol (HALDOL) tablet 5 mg  5 mg Oral TID PRN [name], NP   5 mg at 01/15/23 1211   Or   haloperidol lactate (HALDOL) injection 5 mg  5 mg Intramuscular TID PRN [name], NP       hydrOXYzine (ATARAX) tablet 25 mg  25 mg Oral TID PRN [name], DO   25 mg at 04/20/23 2104   LORazepam (ATIVAN) tablet  2 mg  2 mg Oral TID PRN [name], NP   2 mg at 01/15/23 1211   Or   LORazepam (ATIVAN) injection 2 mg  2 mg Intramuscular TID PRN [name], NP       magnesium hydroxide (MILK OF MAGNESIA) suspension 30 mL  30 mL Oral Daily PRN [name], NP   30 mL at 04/13/23 0814   melatonin tablet 5 mg  5 mg Oral QHS Nkwenti, Doris, NP   5 mg at 04/20/23 2104   nicotine (NICODERM CQ - dosed in mg/24 hours) patch 21 mg  21 mg Transdermal Daily  [name], DO   21 mg at 03/25/23 4098   nicotine polacrilex (NICORETTE) gum 2 mg  2 mg Oral PRN [name], MD   2 mg at 04/20/23 1655   paliperidone (INVEGA) 24 hr tablet 6 mg  6 mg Oral Daily [name], NP   6 mg at 04/20/23 2104   pantoprazole (PROTONIX) EC tablet 40 mg  40 mg Oral Daily [name], NP   40 mg at 04/21/23 0818   polyethylene glycol (MIRALAX / GLYCOLAX) packet 17 g  17 g Oral Daily PRN [name], NP   17 g at 04/13/23 1734   sertraline (ZOLOFT) tablet 150 mg  150 mg Oral Daily Massengill, Harrold Donath, MD   150 mg at 04/21/23 0817   SUMAtriptan (IMITREX) tablet 25 mg  25 mg Oral BID PRN [name], NP   25 mg at 04/13/23 1243   traZODone (DESYREL) tablet 50 mg  50 mg Oral QHS [name], NP   50 mg at 04/20/23 2104   Vitamin D (Ergocalciferol) (DRISDOL) 1.25 MG (50000 UNIT) capsule 50,000 Units  50,000 Units Oral Q7 days [name], NP   50,000 Units at 04/20/23 1655   Lab Results:  No results found for this or any previous visit (from the past 48 hour(s)).    Blood Alcohol level:  Lab Results  Component Value Date   ETH <10 12/19/2022   ETH <10 11/21/2019   Metabolic Disorder Labs: Lab Results  Component Value Date   HGBA1C 4.4 (L) 12/21/2022   MPG 79.58 12/21/2022   No results found for: "PROLACTIN" Lab Results  Component Value Date   CHOL 218 (H) 12/21/2022   TRIG 81 12/21/2022   HDL 53 12/21/2022   CHOLHDL 4.1 12/21/2022   VLDL 16 12/21/2022   LDLCALC 149 (H) 12/21/2022   LDLCALC 110  (H) 03/13/2011   Physical Findings: AIMS: Facial and Oral Movements Muscles of Facial Expression: None Lips and Perioral Area: None Jaw: None Tongue: None,Extremity Movements Upper (arms, wrists, hands, fingers): None Lower (legs, knees, ankles, toes): None, Trunk Movements Neck, shoulders, hips: None, Global Judgements Severity of abnormal movements overall : None Incapacitation due to abnormal movements: None Patient's awareness of abnormal movements: No Awareness, Dental Status Current problems with teeth and/or dentures?: No Does patient usually wear dentures?: No  CIWA:    COWS:    AIMS:0 Musculoskeletal: Strength & Muscle Tone: within normal limits Gait & Station: normal Patient leans: N/A  Psychiatric Specialty Exam:  Presentation  General Appearance:  Casual; Disheveled  Eye Contact: Limited to baseline Speech: Decreased amount, decreased tone and volume but at baseline Speech Volume: Decreased  Handedness: Right  Mood and Affect  Mood: Sad and depressed mainly secondary to being in the hospital with no place to go Affect: Restricted  Thought Process  Thought Processes: Linear  Descriptions of Associations:Intact  Orientation:Full (Time, Place and Person)  Thought Content:Perseveration Concrete History of Schizophrenia/Schizoaffective disorder:Yes  Duration of Psychotic Symptoms:Greater than six months  Hallucinations:Hallucinations: None    Ideas of Reference: None  Suicidal Thoughts:Suicidal Thoughts: No    Homicidal Thoughts:Homicidal Thoughts: No   Sensorium  Memory: Immediate Fair; Recent Fair; Remote Fair  Judgment: Fair  Insight: Fair  Art therapist  Concentration: Fair  Attention Span: Good  Recall: [employer] of Knowledge: Fair  Language: Fair  Psychomotor Activity  Psychomotor Activity: Psychomotor Activity: Normal     Assets  Assets: Resilience; Desire for Improvement  Sleep   Sleep: Sleep: Good  Number of Hours of Sleep: 9    Physical Exam: Physical Exam Vitals and nursing note reviewed.  HENT:     Nose: Nose normal.     Mouth/Throat:     Pharynx: Oropharynx is clear.  Eyes:     Pupils: Pupils are equal, round, and reactive to light.  Cardiovascular:     Rate and Rhythm: Normal rate.     Pulses: Normal pulses.  Pulmonary:     Effort: Pulmonary effort is normal.  Genitourinary:    Comments: Deferred Musculoskeletal:        General: Normal range of motion.     Cervical back: Normal range of motion.  Skin:    General: Skin is warm and dry.  Neurological:     General: No focal deficit present.     Mental Status: She is alert and oriented to person, place, and time.  Psychiatric:        Mood and Affect: Mood normal.        Behavior: Behavior normal.        Thought Content: Thought content normal.    Review of Systems  Constitutional:  Negative for chills, diaphoresis and fever.  HENT:  Negative for congestion, hearing loss and sore throat.   Eyes:  Negative for blurred vision.  Respiratory:  Negative for cough, shortness of breath and wheezing.   Cardiovascular:  Negative for chest pain and palpitations.  Gastrointestinal:  Negative for abdominal pain, constipation, diarrhea, heartburn, nausea and vomiting.  Genitourinary:  Negative for dysuria.  Musculoskeletal:  Negative for joint pain and myalgias.  Skin:  Negative for rash.  Neurological:  Negative for dizziness.  Psychiatric/Behavioral:  Positive for depression. Negative for hallucinations, memory loss, substance abuse and suicidal ideas. The patient is nervous/anxious and has insomnia.   All other systems reviewed and are negative.  Blood pressure 95/60, pulse 87, temperature 98.3 F (36.8 C), temperature source Oral, resp. rate 16, height 4\' 11"  (1.499 m), weight 63 kg, SpO2 100%. Body mass index is 28.05 kg/m.  Treatment Plan Summary: Daily contact with patient to assess and  evaluate symptoms and progress in treatment and Medication management.    Still waiting on safe disposition as patient would not be able to live independently given cognitive impairment. Recommend assisted living.    No medication side effects reported.  Because of urinary incontinence, recommended adult diapers to be worn.   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  Plan:  -Continue Imitrex 25 mg PRN BID for migraines -Continue Colace 100 mg daily for constipation -Continue Vitamin D 50.000 units weekly for bone health. -Continue Sertraline150 mg po Q daily for depression/anxiety -Continue Buspar 15 mg po bid for anxiety.  -Continue paliperidone 6 mg po qd for mood stabilization -Continue Trazodone 50 mg nightly for insomnia  -Continue Nicoderm 21 mg topically Q 24 hrs for nicotine withdrawal management. -Continue vitamin B12 1000 mcg IM weekly for 4 weeks then monthly -Continue Melatonin 5 mg nightly for sleep -Continue Protonix EC 40 mg p.o. daily for GERD -Continue MiraLAX 17 g p.o. PRN for constipation -Continue hydroxyzine 25 mg p.o. 3 times daily as needed for anxiety -Continue Ensure nutritional shakes TID in between meals  -Previously discontinued Hydroxyzine 50 mg nightly-hypotension in the mornings   Safety and Monitoring: Voluntary admission to inpatient psychiatric unit for safety, stabilization and treatment Daily  contact with patient to assess and evaluate symptoms and progress in treatment Patient's case to be discussed in multi-disciplinary team meeting Observation Level : q15 minute checks Vital signs: q12 hours Precautions: Safety   Discharge Planning: Social work and case management to assist with discharge planning and  identification of hospital follow-up needs prior to discharge Estimated LOS: Unknown at this time. Discharge Concerns: Need to establish a safety plan; Medication compliance and effectiveness Discharge Goals: Return home with outpatient referrals for mental health follow-up including medication management/psychotherapy No legal guardian, has payee  [name], MD,  04/21/2023, 11:08 AM Patient ID: Yolanda [name], female   DOB: October 07, 1975, 47 y.o.   MRN: [id number]  Patient ID: TRACI GAFFORD, female   DOB: [birth year], 47 y.o.   MRN: [id number] Patient ID: CHEREE FOWLES, female   DOB: [birth year], 47 y.o.   MRN: [id number] Patient ID: FRANCIS YARDLEY, female   DOB: 23-Sep-1975, 47 y.o.   MRN: [id number] Patient ID: CHRISTABELLE HANZLIK, female   DOB: [birth year], 47 y.o.   MRN: [id number]

## 2023-04-22 DIAGNOSIS — F251 Schizoaffective disorder, depressive type: Secondary | ICD-10-CM | POA: Diagnosis not present

## 2023-04-22 NOTE — Progress Notes (Signed)
[facility] MD Progress Note  04/22/2023 6:13 PM Yolanda [name]  MRN:  [id number]  Principal Problem: Schizoaffective disorder, depressive type (HCC)  Diagnosis: Principal Problem:   Schizoaffective disorder, depressive type (HCC) Active Problems:   GERD (gastroesophageal reflux disease)   Intellectual disability   Tobacco use disorder  Reason for admission: This is the first psychiatric admission in this [facility] in 12 years for this 47 AA female with an extensive hx of mental illnesses & probable polysubstance use disorders. She is admitted to the [facility] from the [facility] hospital with complain of worsening suicidal ideations with plan to stab herself. Per chart review, patient apparently reported at the ED that she has been depressed for a while & has not been taking her mental health medications. After medical evaluation.clearance, she was transferred to the [facility] for further psychiatric evaluation/treatments.   24 hr chart review: Staff reports no change in the patient.  She is compliant with medications.  She is sleeping good at night per nursing reports, remains compliant with medications, vital signs remained within normal limits, no behavioral episodes in the last 24 hours, however, patient is staying isolative to her room, and is showing poor motivation to attend unit activities and participate in programming.  Patient assessment note: During encounter today, patient is verbalizing her frustration at this continuous hospitalization, reports that she was hopeful after her last meeting about getting placed into a group home.  Mood is very depressed today, as she verbalizes not knowing if she will ever get out of the hospital.  Empathy provided, active listening provided, she is encouraged to stay positive, and verbalizes understanding.  She denies SI/HI/AVH, denies paranoia, and there is no evidence of delusional thinking.  She is being encouraged to get out of bed and attend unit activities, and  attend to personal hygiene needs, and has verbalized understanding.  Today, she verbalized anhedonia with Clinical research associate, reported appetite as being poor, stated that she did not eat breakfast, and writer saw her shortly before lunch, and she was verbalizing not knowing if she will eat lunch because she had no appetite.  Positive reinforcements were given to patient to get out of bed and prepare for lunch, and she was able to comply.  We will continue to revisit discharge planning on a daily basis, as we continue to coordinate with CSW and DSS to find an appropriate placement for patient in the community..  Continuing all medications as listed below, symptoms currently stable for management outside of the hospital setting, but we continue to await placement by DSS, as cognitive limitations negatively impact upon patient's ability to function independently in a shelter type environment.  She has no family members or friends at this time to provide her with housing until such a facility becomes available.  We will continue to follow.  Total Time spent with patient:  25 minutes  Past Psychiatric History:  See H & P  Past Medical History:  Past Medical History:  Diagnosis Date   Bipolar affect, depressed (HCC)    Constipation 08/17/2022   Depression    Falls 07/21/2022   Fracture of femoral neck, right, closed (HCC) 01/17/2022   Herpes simplex 08/22/2017   Open fracture dislocation of right elbow joint 01/17/2022    Past Surgical History:  Procedure Laterality Date   NO PAST SURGERIES     SALPINGECTOMY     Family History: History reviewed. No pertinent family history.  Family Psychiatric  History: See H & P  Social History:  Social History   Substance and Sexual Activity  Alcohol Use Yes     Social History   Substance and Sexual Activity  Drug Use Yes   Types: Cocaine, Marijuana    Social History   Socioeconomic History   Marital status: Single    Spouse name: Not on file   Number  of children: Not on file   Years of education: Not on file   Highest education level: Not on file  Occupational History   Not on file  Tobacco Use   Smoking status: Every Day   Smokeless tobacco: Not on file  Substance and Sexual Activity   Alcohol use: Yes   Drug use: Yes    Types: Cocaine, Marijuana   Sexual activity: Yes  Other Topics Concern   Not on file  Social History Narrative   Not on file   Social Determinants of Health   Financial Resource Strain: Low Risk  (09/12/2022)   Received from [facility]   Overall Financial Resource Strain (CARDIA)    Difficulty of Paying Living Expenses: Not hard at all  Food Insecurity: Patient Declined (12/20/2022)   Hunger Vital Sign    Worried About Running Out of Food in the Last Year: Patient declined    Ran Out of Food in the Last Year: Patient declined  Transportation Needs: No Transportation Needs (12/20/2022)   PRAPARE - Administrator, Civil Service (Medical): No    Lack of Transportation (Non-Medical): No  Physical Activity: Not on file  Stress: No Stress Concern Present (07/17/2022)   Received from [facility], [facility] Occupational [facility] - Occupational Stress Questionnaire    Feeling of Stress : Not at all  Social Connections: Unknown (07/16/2022)   Received from [facility], [facility]   Social Network    Social Network: Not on file   Sleep: Good  Appetite:  Good  Current Medications: Current Facility-Administered Medications  Medication Dose Route Frequency Provider Last Rate Last Admin   acetaminophen (TYLENOL) tablet 650 mg  650 mg Oral Q6H PRN [name], NP   650 mg at 04/22/23 1304   alum & mag hydroxide-simeth (MAALOX/MYLANTA) 200-200-20 MG/5ML suspension 30 mL  30 mL Oral Q4H PRN [name], NP   30 mL at 01/28/23 1530   busPIRone (BUSPAR) tablet 15 mg  15 mg Oral BID [name], NP   15 mg at 04/22/23 1657   cyanocobalamin (VITAMIN B12)  injection 1,000 mcg  1,000 mcg Intramuscular Q30 days [name], Nadir, MD   1,000 mcg at 04/03/23 1237   diphenhydrAMINE (BENADRYL) capsule 50 mg  50 mg Oral TID PRN [name], NP   50 mg at 01/15/23 1211   Or   diphenhydrAMINE (BENADRYL) injection 50 mg  50 mg Intramuscular TID PRN [name], NP       docusate sodium (COLACE) capsule 100 mg  100 mg Oral Daily Jann Milkovich, NP   100 mg at 04/22/23 0827   feeding supplement (ENSURE ENLIVE / ENSURE PLUS) liquid 237 mL  237 mL Oral TID BM Larayne Baxley, NP   237 mL at 04/22/23 0914   haloperidol (HALDOL) tablet 5 mg  5 mg Oral TID PRN [name], NP   5 mg at 01/15/23 1211   Or   haloperidol lactate (HALDOL) injection 5 mg  5 mg Intramuscular TID PRN [name], NP       hydrOXYzine (ATARAX) tablet 25 mg  25 mg Oral TID PRN [name], DO   25 mg at 04/20/23 2104   LORazepam (ATIVAN) tablet 2 mg  2 mg Oral TID PRN [name], NP   2 mg at 01/15/23 1211   Or   LORazepam (ATIVAN) injection 2 mg  2 mg Intramuscular TID PRN [name], NP       magnesium hydroxide (MILK OF MAGNESIA) suspension 30 mL  30 mL Oral Daily PRN [name], NP   30 mL at 04/13/23 0814   melatonin tablet 5 mg  5 mg Oral QHS Cole Eastridge, NP   5 mg at 04/21/23 2050   nicotine (NICODERM CQ - dosed in mg/24 hours) patch 21 mg  21 mg Transdermal Daily [name], DO   21 mg at 03/25/23 1610   nicotine polacrilex (NICORETTE) gum 2 mg  2 mg Oral PRN [name], MD   2 mg at 04/22/23 1804   paliperidone (INVEGA) 24 hr tablet 6 mg  6 mg Oral Daily Dencil Cayson, Tyler Aas, NP   6 mg at 04/21/23 2050   pantoprazole (PROTONIX) EC tablet 40 mg  40 mg Oral Daily [name], NP   40 mg at 04/22/23 9604   polyethylene glycol (MIRALAX / GLYCOLAX) packet 17 g  17 g Oral Daily PRN [name], NP   17 g at 04/13/23 1734   sertraline (ZOLOFT) tablet 150 mg  150 mg Oral Daily Massengill, Harrold Donath, MD   150 mg at 04/22/23 5409   SUMAtriptan (IMITREX) tablet 25 mg   25 mg Oral BID PRN [name], NP   25 mg at 04/13/23 1243   traZODone (DESYREL) tablet 50 mg  50 mg Oral QHS [name], NP   50 mg at 04/21/23 2050   Vitamin D (Ergocalciferol) (DRISDOL) 1.25 MG (50000 UNIT) capsule 50,000 Units  50,000 Units Oral Q7 days [name], NP   50,000 Units at 04/20/23 1655   Lab Results:  No results found for this or any previous visit (from the past 48 hour(s)).    Blood Alcohol level:  Lab Results  Component Value Date   ETH <10 12/19/2022   ETH <10 11/21/2019   Metabolic Disorder Labs: Lab Results  Component Value Date   HGBA1C 4.4 (L) 12/21/2022   MPG 79.58 12/21/2022   No results found for: "PROLACTIN" Lab Results  Component Value Date   CHOL 218 (H) 12/21/2022   TRIG 81 12/21/2022   HDL 53 12/21/2022   CHOLHDL 4.1 12/21/2022   VLDL 16 12/21/2022   LDLCALC 149 (H) 12/21/2022   LDLCALC 110 (H) 03/13/2011   Physical Findings: AIMS: Facial and Oral Movements Muscles of Facial Expression: None Lips and Perioral Area: None Jaw: None Tongue: None,Extremity Movements Upper (arms, wrists, hands, fingers): None Lower (legs, knees, ankles, toes): None, Trunk Movements Neck, shoulders, hips: None, Global Judgements Severity of abnormal movements overall : None Incapacitation due to abnormal movements: None Patient's awareness of abnormal movements: No Awareness, Dental Status Current problems with teeth and/or dentures?: No Does patient usually wear dentures?: No  CIWA:    COWS:    AIMS:0 Musculoskeletal: Strength & Muscle Tone: within normal limits Gait & Station: normal Patient leans: N/A  Psychiatric Specialty Exam:  Presentation  General Appearance:  Disheveled  Eye Contact: Limited to baseline Speech: Decreased amount, decreased tone and volume but at baseline Speech Volume: Normal  Handedness: Right  Mood and Affect  Mood: Sad and depressed mainly secondary to being in the hospital with no  place to  go Affect: Congruent  Thought Process  Thought Processes: Coherent  Descriptions of Associations:Intact  Orientation:Partial  Thought Content:Logical Concrete History of Schizophrenia/Schizoaffective disorder:Yes  Duration of Psychotic Symptoms:Greater than six months  Hallucinations:Hallucinations: None    Ideas of Reference: None  Suicidal Thoughts:Suicidal Thoughts: No    Homicidal Thoughts:Homicidal Thoughts: No   Sensorium  Memory: Recent Fair  Judgment: Impaired  Insight: Fair  Chartered certified accountant: Fair  Attention Span: Fair  Recall: Poor  Fund of Knowledge: Fair  Language: Fair  Psychomotor Activity  Psychomotor Activity: Psychomotor Activity: Normal      Assets  Assets: Resilience  Sleep  Sleep: Sleep: Good Number of Hours of Sleep: 9    Physical Exam: Physical Exam Vitals and nursing note reviewed.  HENT:     Nose: Nose normal.     Mouth/Throat:     Pharynx: Oropharynx is clear.  Eyes:     Pupils: Pupils are equal, round, and reactive to light.  Cardiovascular:     Rate and Rhythm: Normal rate.     Pulses: Normal pulses.  Pulmonary:     Effort: Pulmonary effort is normal.  Genitourinary:    Comments: Deferred Musculoskeletal:        General: Normal range of motion.     Cervical back: Normal range of motion.  Skin:    General: Skin is warm and dry.  Neurological:     General: No focal deficit present.     Mental Status: She is alert and oriented to person, place, and time.  Psychiatric:        Mood and Affect: Mood normal.        Behavior: Behavior normal.        Thought Content: Thought content normal.    Review of Systems  Constitutional:  Negative for chills, diaphoresis and fever.  HENT:  Negative for congestion, hearing loss and sore throat.   Eyes:  Negative for blurred vision.  Respiratory:  Negative for cough, shortness of breath and wheezing.   Cardiovascular:  Negative for  chest pain and palpitations.  Gastrointestinal:  Negative for abdominal pain, constipation, diarrhea, heartburn, nausea and vomiting.  Genitourinary:  Negative for dysuria.  Musculoskeletal:  Negative for joint pain and myalgias.  Skin:  Negative for rash.  Neurological:  Negative for dizziness.  Psychiatric/Behavioral:  Positive for depression. Negative for hallucinations, memory loss, substance abuse and suicidal ideas. The patient is nervous/anxious and has insomnia.   All other systems reviewed and are negative.  Blood pressure 100/67, pulse 81, temperature 97.6 F (36.4 C), temperature source Oral, resp. rate 18, height 4\' 11"  (1.499 m), weight 63 kg, SpO2 100%. Body mass index is 28.05 kg/m.  Treatment Plan Summary: Daily contact with patient to assess and evaluate symptoms and progress in treatment and Medication management.    Still waiting on safe disposition as patient would not be able to live independently given cognitive impairment. Recommend assisted living.    No medication side effects reported.  Because of urinary incontinence, recommended adult diapers to be worn.   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  Plan:  -Continue Imitrex 25 mg PRN BID for migraines -Continue Colace 100 mg daily for constipation -Continue Vitamin D 50.000 units weekly for bone health. -Continue Sertraline150 mg po Q daily for depression/anxiety -Continue Buspar 15 mg po bid  for anxiety.  -Continue paliperidone 6 mg po qd for mood stabilization -Continue Trazodone 50 mg nightly for insomnia  -Continue Nicoderm 21 mg topically Q 24 hrs for nicotine withdrawal management. -Continue vitamin B12 1000 mcg IM weekly for 4 weeks then monthly -Continue  Melatonin 5 mg nightly for sleep -Continue Protonix EC 40 mg p.o. daily for GERD -Continue MiraLAX 17 g p.o. PRN for constipation -Continue hydroxyzine 25 mg p.o. 3 times daily as needed for anxiety -Continue Ensure nutritional shakes TID in between meals  -Previously discontinued Hydroxyzine 50 mg nightly-hypotension in the mornings   Safety and Monitoring: Voluntary admission to inpatient psychiatric unit for safety, stabilization and treatment Daily contact with patient to assess and evaluate symptoms and progress in treatment Patient's case to be discussed in multi-disciplinary team meeting Observation Level : q15 minute checks Vital signs: q12 hours Precautions: Safety   Discharge Planning: Social work and case management to assist with discharge planning and identification of hospital follow-up needs prior to discharge Estimated LOS: Unknown at this time. Discharge Concerns: Need to establish a safety plan; Medication compliance and effectiveness Discharge Goals: Return home with outpatient referrals for mental health follow-up including medication management/psychotherapy No legal guardian, has payee  [name], NP,  04/22/2023, 6:13 PM Patient ID: Yolanda [name], female   DOB: July 25, 1975, 47 y.o.   MRN: [id number]

## 2023-04-22 NOTE — Group Note (Signed)
Occupational Therapy Group Note  Group Topic: Sleep Hygiene  Group Date: 04/22/2023 Start Time: 1430 End Time: 1505 Facilitators: Ted Mcalpine, OT   Group Description: Group encouraged increased participation and engagement through topic focused on sleep hygiene. Patients reflected on the quality of sleep they typically receive and identified areas that need improvement. Group was given background information on sleep and sleep hygiene, including common sleep disorders. Group members also received information on how to improve one's sleep and introduced a sleep diary as a tool that can be utilized to track sleep quality over a length of time. Group session ended with patients identifying one or more strategies they could utilize or implement into their sleep routine in order to improve overall sleep quality.        Therapeutic Goal(s):  Identify one or more strategies to improve overall sleep hygiene  Identify one or more areas of sleep that are negatively impacted (sleep too much, too little, etc)     Participation Level: Minimal   Participation Quality: Independent   Behavior: Appropriate   Speech/Thought Process: Barely audible   Affect/Mood: Flat   Insight: Limited   Judgement: Limited      Modes of Intervention: Education  Patient Response to Interventions:  Attentive   Plan: Continue to engage patient in OT groups 2 - 3x/week.  04/22/2023  Ted Mcalpine, OT   Kerrin Champagne, OT

## 2023-04-22 NOTE — Progress Notes (Signed)
Patient ID: Yolanda [name], female   DOB: 1975/10/21, 47 y.o.   MRN: [id number] Pt denies any SI HI . She has been visible on the unit and able to voice her needs. Pt continues to have crying spells at times and voices frustrations at her long length of stay due to placement. Allowed to ventilate and support given. Pt is safe.

## 2023-04-22 NOTE — Group Note (Signed)
Date:  04/22/2023 Time:  11:36 AM  Group Topic/Focus:  Dimensions of Wellness:   The focus of this group is to introduce the topic of wellness and discuss the role each dimension of wellness plays in total health.    Participation Level:  Did Not Attend  Participation Quality:      Affect:      Cognitive:      Insight: None  Engagement in Group:      Modes of Intervention:      Additional Comments:    Beckie Busing 04/22/2023, 11:36 AM

## 2023-04-22 NOTE — [facility] Group Notes (Signed)
Spiritual care group on grief and loss facilitated by Chaplain Dyanne Carrel, [facility]  Group Goal: Support / Education around grief and loss  Members engage in facilitated group support and psycho-social education.  Group Description:  Following introductions and group rules, group members engaged in facilitated group dialogue and support around topic of loss, with particular support around experiences of loss in their lives. Group Identified types of loss (relationships / self / things) and identified patterns, circumstances, and changes that precipitate losses. Reflected on thoughts / feelings around loss, normalized grief responses, and recognized variety in grief experience. Group encouraged individual reflection on safe space and on the coping skills that they are already utilizing.  Group drew on Adlerian / Rogerian and narrative framework  Patient Progress: Yolanda [name] was present at the beginning of group but left shortly after it began.

## 2023-04-22 NOTE — Progress Notes (Signed)
CSW spoke with Ms. Yolanda [name] [facility]) and completed an assessment for placement. CSW provided information concerning pt's activities of daily living, cognitive impairments ect.. The data collected will be sent to sent [facility] and to the state for approval. Will continue to monitor.

## 2023-04-22 NOTE — Progress Notes (Signed)
D) Pt received calm, visible, participating in milieu, and in no acute distress. Pt A & O x4. Pt denies SI, HI, A/ V H, depression, anxiety and pain at this time. A) Pt encouraged to drink fluids. Pt encouraged to come to staff with needs. Pt encouraged to attend and participate in groups. Pt encouraged to set reachable goals.  R) Pt remained safe on unit, in no acute distress, will continue to assess.   Pt c/o anxiety before sleep, medication provided.   04/21/23 2100  Psych Admission Type (Psych Patients Only)  Admission Status Involuntary  Psychosocial Assessment  Patient Complaints Depression  Eye Contact Poor  Facial Expression Sad  Affect Anxious  Speech Soft  Interaction Childlike  Motor Activity Slow  Appearance/Hygiene Disheveled  Behavior Characteristics Appropriate to situation;Cooperative  Mood Pleasant;Depressed  Thought Process  Coherency WDL  Content Preoccupation  Delusions None reported or observed  Perception WDL  Hallucination None reported or observed  Judgment Poor  Confusion Mild  Danger to Self  Current suicidal ideation? Denies  Agreement Not to Harm Self Yes  Description of Agreement verbal  Danger to Others  Danger to Others None reported or observed

## 2023-04-22 NOTE — [facility] Group Notes (Signed)
[facility] Group Notes:  (Nursing/MHT/Case Management/Adjunct)  Date:  04/22/2023  Time:  9:23 PM  Type of Therapy:  Group Therapy  Participation Level:  Did Not Attend  Participation Quality:  Resistant  Affect:  Resistant  Cognitive:  Lacking  Insight:  None  Engagement in Group:  Supportive  Modes of Intervention:  Education  Summary of Progress/Problems: The patient did not attend the evening A.A. meeting.   Hazle Coca S 04/22/2023, 9:23 PM

## 2023-04-22 NOTE — Group Note (Signed)
Date:  04/22/2023 Time:  10:49 AM  Group Topic/Focus:  Goals Group:   The focus of this group is to help patients establish daily goals to achieve during treatment and discuss how the patient can incorporate goal setting into their daily lives to aide in recovery.    Participation Level:  Did Not Attend  Participation Quality:   NA  Affect:   NA  Cognitive:   NA  Insight: None  Engagement in Group:   NA  Modes of Intervention:   NA  Additional Comments:    Beckie Busing 04/22/2023, 10:49 AM

## 2023-04-22 NOTE — Plan of Care (Signed)
  Problem: Education: Goal: Utilization of techniques to improve thought processes will improve 04/22/2023 1210 by Ria Clock, RN Outcome: Progressing 04/22/2023 1210 by Ria Clock, RN Outcome: Progressing Goal: Knowledge of the prescribed therapeutic regimen will improve 04/22/2023 1210 by Ria Clock, RN Outcome: Progressing 04/22/2023 1210 by Ria Clock, RN Outcome: Progressing   Problem: Activity: Goal: Interest or engagement in leisure activities will improve 04/22/2023 1210 by Ria Clock, RN Outcome: Progressing 04/22/2023 1210 by Malva Limes D, RN Outcome: Progressing Goal: Imbalance in normal sleep/wake cycle will improve 04/22/2023 1210 by Ria Clock, RN Outcome: Progressing 04/22/2023 1210 by Malva Limes D, RN Outcome: Progressing   Problem: Coping: Goal: Coping ability will improve 04/22/2023 1210 by Ria Clock, RN Outcome: Progressing 04/22/2023 1210 by Ria Clock, RN Outcome: Progressing Goal: Will verbalize feelings 04/22/2023 1210 by Ria Clock, RN Outcome: Progressing 04/22/2023 1210 by Ria Clock, RN Outcome: Progressing

## 2023-04-22 NOTE — Group Note (Signed)
Recreation Therapy Group Note   Group Topic:Stress Management  Group Date: 04/22/2023 Start Time: 0935 End Time: 1001 Facilitators: Dayn Barich-McCall, LRT,CTRS Location: [address] Dayroom   Group Topic: Stress Management  Goal Area(s) Addresses:  Patient will identify positive stress management techniques. Patient will identify benefits of using stress management post d/c.  Group Description: Meditation. LRT played a meditation that focused on being present, living in gratitude and bringing joy into your day.     Education: Stress Management, Discharge Planning.   Education Outcome: Acknowledges Education   Affect/Mood: N/A   Participation Level: Did not attend    Clinical Observations/Individualized Feedback:     Plan: Continue to engage patient in RT group sessions 2-3x/week.   Yolanda [name], LRT,CTRS 04/22/2023 12:08 PM

## 2023-04-23 DIAGNOSIS — F251 Schizoaffective disorder, depressive type: Secondary | ICD-10-CM | POA: Diagnosis not present

## 2023-04-23 NOTE — Progress Notes (Signed)
   04/22/23 2116  Psych Admission Type (Psych Patients Only)  Admission Status Involuntary  Psychosocial Assessment  Patient Complaints None  Eye Contact Fair  Facial Expression Animated  Affect Appropriate to circumstance  Speech Soft  Interaction Childlike  Motor Activity Slow  Appearance/Hygiene Disheveled  Behavior Characteristics Cooperative  Mood Pleasant  Thought Process  Coherency WDL  Content Preoccupation  Delusions None reported or observed  Perception WDL  Hallucination None reported or observed  Judgment Poor  Confusion Mild  Danger to Self  Current suicidal ideation? Denies  Agreement Not to Harm Self Yes  Description of Agreement verbal  Danger to Others  Danger to Others None reported or observed

## 2023-04-23 NOTE — Plan of Care (Signed)

## 2023-04-23 NOTE — Progress Notes (Signed)
   04/23/23 1550  Spiritual Encounters  Type of Visit Initial  Care provided to: Patient  Referral source Patient request  Reason for visit Routine spiritual support  OnCall Visit No  Spiritual Framework  Presenting Themes Meaning/purpose/sources of inspiration;Values and beliefs;Community and relationships;Impactful experiences and emotions  Values/beliefs Patient believes in God, reads the Bible  Community/Connection Family;Friend(s)  Patient Stress Factors Health changes;Major life changes;Family relationships  Family Stress Factors Family relationships  Interventions  Spiritual Care Interventions Made Established relationship of care and support;Compassionate presence;Reflective listening;Normalization of emotions;Reconciliation with self/others;Narrative/life review;Explored values/beliefs/practices/strengths;Meaning making;Encouragement  Intervention Outcomes  Outcomes Connection to spiritual care;Awareness of support;Reduced isolation;Reduced anxiety  Spiritual Care Plan  Spiritual Care Issues Still Outstanding No further spiritual care needs at this time (see row info)   Chaplain met patient in front of the nurse's station and she asked the chaplain to sit and talk. 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 She state that God cannot "fix" her. The patient also stated that the thoughts in her head and in her heart are loud. She feels "insecure" and believes that if you read the Bible and have faith that a person should not be insecure. Chaplain normalized her emotions. Patient participated in life review and discussed her relationship with her daughters. Patient is excited about her upcoming birthday and hopes that her daughter will bring her grandson to see her.   Arlyce Dice, Chaplain Resident

## 2023-04-23 NOTE — Progress Notes (Signed)
Pt aware of need for urine specimen, cup given.

## 2023-04-23 NOTE — Progress Notes (Signed)
Patient ID: Yolanda [name], female   DOB: 02-Apr-1976, 47 y.o.   MRN: [id number] Pt presents with depressed mood,affect congruent. PT denies any SI HI . She has been visible on the unit and able to voice her needs. Pt continues to have crying spells at times and voices frustrations at her long length of stay due to placement. She states she would like an update from SW and informed that Clinical research associate would notify team. Pt also reporting vaginal itchying, stating '' I am starting to itch down there, I don't know if it's my herpes or what. '' MD informed, order for UA received and pt notified of need for urine , cup provided. Allowed to ventilate and support given. Pt is safe.

## 2023-04-23 NOTE — Plan of Care (Signed)
Problem: Coping: Goal: Will verbalize feelings Outcome: Progressing   Problem: Medication: Goal: Compliance with prescribed medication regimen will improve Outcome: Progressing

## 2023-04-23 NOTE — BHH Group Notes (Signed)
Adult Psychoeducational Group Note  Date:  04/23/2023 Time:  1:25 PM  Group Topic/Focus:  Crisis Planning:   The purpose of this group is to help patients create a crisis plan for use upon discharge or in the future, as needed.  Participation Level:  Active  Participation Quality:  Attentive  Affect:  Appropriate  Cognitive:  Alert  Insight: Appropriate  Engagement in Group:  Engaged  Modes of Intervention:  Discussion  Additional Comments:  Patient attended the [facility] group.  [name] 04/23/2023, 1:25 PM

## 2023-04-23 NOTE — Progress Notes (Signed)
   04/23/23 0606  15 Minute Checks  Location Bedroom  Visual Appearance Calm  Behavior Composed  Sleep (Behavioral Health Patients Only)  Calculate sleep? (Click Yes once per 24 hr at 0600 safety check) Yes  Documented sleep last 24 hours 7.25

## 2023-04-23 NOTE — Group Note (Signed)
Recreation Therapy Group Note   Group Topic:Animal Assisted Therapy   Group Date: 04/23/2023 Start Time: 0954 End Time: 1030 Facilitators: Zuleima Haser-McCall, LRT,CTRS Location: [address] Dayroom   Animal-Assisted Activity (AAA) Program Checklist/Progress Notes Patient Eligibility Criteria Checklist & Daily Group note for Rec Tx Intervention  AAA/T Program Assumption of Risk Form signed by Patient/ or Parent Legal Guardian Yes  Patient understands his/her participation is voluntary Yes  Education: Charity fundraiser, Appropriate Animal Interaction   Education Outcome: Acknowledges education.    Affect/Mood: N/A   Participation Level: Did not attend    Clinical Observations/Individualized Feedback:     Plan: Continue to engage patient in RT group sessions 2-3x/week.   Yolanda [name], LRT,CTRS 04/23/2023 12:35 PM

## 2023-04-23 NOTE — [facility] Group Notes (Signed)
Adult Psychoeducational Group Note  Date:  04/23/2023 Time:  8:48 PM  Group Topic/Focus:  Wrap-Up Group:   The focus of this group is to help patients review their daily goal of treatment and discuss progress on daily workbooks.  Participation Level:  Active  Participation Quality:  Attentive  Affect:  Appropriate  Cognitive:  Alert  Insight: Improving  Engagement in Group:  Engaged  Modes of Intervention:  Discussion  Additional Comments:  Pt attended and participated in group.  Maura Crandall Cassandra 04/23/2023, 8:48 PM

## 2023-04-23 NOTE — Group Note (Signed)
LCSW Group Therapy Note   Group Date: 04/23/2023 Start Time: 1100 End Time: 1200  LCSW Group Therapy Note  Type of Therapy and Topic:  Group Therapy: Establishing Boundaries  Participation Level:  Did Not Attend  In this group, patients learned how to define boundaries, discussed the different types or boundaries with examples.  They identified times that boundaries had been violated and how they reacted.  They analyzed how their reaction was possibly beneficial and how it was possibly unhelpful.  The group discussed how to set boundaries, respect others boundaries and communicate their boundaries. The group utilized a role play scenarios (working with a partner) and discussed how each person in the scenario could have reacted differently and what boundaries they need to implement to improve their life. Patients also discussed consequences to overstepping boundaries and lack of boundaries. Patients discussed how to establish boundaries with clear consequences. Patients will explore discussion questions that address media influence and why it is hard to set boundaries.   Therapeutic Goals: Patients will define boundaries and explore (physical, personal space and language boundaries). Patients will remember their last incident where their boundaries were violated and how they behaved. Patients will practice empathy and understanding of other's boundaries and learn from others in group. Patients will explore how they may have crossed another person's boundaries in the past.  Patients will learn healthy ways to set and communicate boundaries. Patients will actively engage in group activity utilizing role play and critical thinking skills.  Summary of Patient Progress:  Did not attend    Therapeutic Modalities:   Cognitive Behavioral Therapy  Izell [place], LCSW  04/23/2023 1:33 PM

## 2023-04-24 ENCOUNTER — Encounter (HOSPITAL_COMMUNITY): Payer: Self-pay

## 2023-04-24 DIAGNOSIS — F251 Schizoaffective disorder, depressive type: Secondary | ICD-10-CM | POA: Diagnosis not present

## 2023-04-24 LAB — URINALYSIS, ROUTINE W REFLEX MICROSCOPIC
Bilirubin Urine: NEGATIVE
Glucose, UA: NEGATIVE mg/dL
Hgb urine dipstick: NEGATIVE
Ketones, ur: NEGATIVE mg/dL
Nitrite: NEGATIVE
Protein, ur: NEGATIVE mg/dL
Specific Gravity, Urine: 1.01 (ref 1.005–1.030)
pH: 6 (ref 5.0–8.0)

## 2023-04-24 NOTE — Group Note (Signed)
Recreation Therapy Group Note   Group Topic:Problem Solving  Group Date: 04/24/2023 Start Time: 0935 End Time: 1015 Facilitators: Boluwatife Mutchler-McCall, LRT,CTRS Location: [address] Dayroom   Group Topic: Communication, Team Building, Problem Solving  Goal Area(s) Addresses:  Patient will effectively work with peer towards shared goal.  Patient will identify skills used to make activity successful.  Patient will share challenges and verbalize solution-driven approaches used. Patient will identify how skills used during activity can be used to reach post d/c goals.   Intervention: STEM Activity   Group Description: [employer]. Patients were provided the following materials: 4 drinking straws, 5 rubber bands, 5 paper clips, 2 index cards, 2 drinking cups, and 2 toilet paper rolls. Using the provided materials patients were asked to build a launching mechanism to launch a ping pong ball across the room, approximately 10 feet. Patients were divided into teams of 3-5. Instructions required all materials be incorporated into the device, functionality of items left to the peer group's discretion.  Education: Pharmacist, community, Scientist, physiological, Air cabin crew, Building control surveyor.   Education Outcome: Acknowledges education/In group clarification offered/Needs additional education.    Affect/Mood: N/A   Participation Level: Did not attend    Clinical Observations/Individualized Feedback:     Plan: Continue to engage patient in RT group sessions 2-3x/week.   Yolanda [name], LRT,CTRS 04/24/2023 11:54 AM

## 2023-04-24 NOTE — Plan of Care (Signed)
Problem: Education: Goal: Knowledge of the prescribed therapeutic regimen will improve Outcome: Progressing   Problem: Activity: Goal: Interest or engagement in leisure activities will improve Outcome: Progressing Goal: Imbalance in normal sleep/wake cycle will improve Outcome: Progressing   Problem: Coping: Goal: Coping ability will improve Outcome: Progressing Goal: Will verbalize feelings Outcome: Progressing   Problem: Health Behavior/Discharge Planning: Goal: Ability to make decisions will improve Outcome: Progressing Goal: Compliance with therapeutic regimen will improve Outcome: Progressing   Problem: Role Relationship: Goal: Will demonstrate positive changes in social behaviors and relationships Outcome: Progressing   Problem: Safety: Goal: Ability to disclose and discuss suicidal ideas will improve Outcome: Progressing

## 2023-04-24 NOTE — BH IP Treatment Plan (Signed)
Interdisciplinary Treatment and Diagnostic Plan Update  04/24/2023 Time of Session: 130 Yolanda [name] MRN: [id number]  Principal Diagnosis: Schizoaffective disorder, depressive type (HCC)  Secondary Diagnoses: Principal Problem:   Schizoaffective disorder, depressive type (HCC) Active Problems:   GERD (gastroesophageal reflux disease)   Intellectual disability   Tobacco use disorder   Current Medications:  Current Facility-Administered Medications  Medication Dose Route Frequency Provider Last Rate Last Admin   acetaminophen (TYLENOL) tablet 650 mg  650 mg Oral Q6H PRN [name], NP   650 mg at 04/22/23 1304   alum & mag hydroxide-simeth (MAALOX/MYLANTA) 200-200-20 MG/5ML suspension 30 mL  30 mL Oral Q4H PRN [name], NP   30 mL at 01/28/23 1530   busPIRone (BUSPAR) tablet 15 mg  15 mg Oral BID [name], NP   15 mg at 04/24/23 0836   cyanocobalamin (VITAMIN B12) injection 1,000 mcg  1,000 mcg Intramuscular Q30 days [name], Nadir, MD   1,000 mcg at 04/03/23 1237   diphenhydrAMINE (BENADRYL) capsule 50 mg  50 mg Oral TID PRN [name], NP   50 mg at 01/15/23 1211   Or   diphenhydrAMINE (BENADRYL) injection 50 mg  50 mg Intramuscular TID PRN [name], NP       docusate sodium (COLACE) capsule 100 mg  100 mg Oral Daily Nkwenti, Tyler Aas, NP   100 mg at 04/24/23 0836   feeding supplement (ENSURE ENLIVE / ENSURE PLUS) liquid 237 mL  237 mL Oral TID BM Nkwenti, Doris, NP   237 mL at 04/24/23 1058   haloperidol (HALDOL) tablet 5 mg  5 mg Oral TID PRN [name], NP   5 mg at 01/15/23 1211   Or   haloperidol lactate (HALDOL) injection 5 mg  5 mg Intramuscular TID PRN [name], NP       hydrOXYzine (ATARAX) tablet 25 mg  25 mg Oral TID PRN [name], DO   25 mg at 04/20/23 2104   LORazepam (ATIVAN) tablet 2 mg  2 mg Oral TID PRN [name], NP   2 mg at 01/15/23 1211   Or   LORazepam (ATIVAN) injection 2 mg  2 mg Intramuscular TID PRN [name],  NP       magnesium hydroxide (MILK OF MAGNESIA) suspension 30 mL  30 mL Oral Daily PRN [name], NP   30 mL at 04/13/23 0814   melatonin tablet 5 mg  5 mg Oral QHS Nkwenti, Doris, NP   5 mg at 04/23/23 2205   nicotine (NICODERM CQ - dosed in mg/24 hours) patch 21 mg  21 mg Transdermal Daily [name], DO   21 mg at 03/25/23 3875   nicotine polacrilex (NICORETTE) gum 2 mg  2 mg Oral PRN [name], MD   2 mg at 04/23/23 1202   paliperidone (INVEGA) 24 hr tablet 6 mg  6 mg Oral Daily Nkwenti, Tyler Aas, NP   6 mg at 04/23/23 2205   pantoprazole (PROTONIX) EC tablet 40 mg  40 mg Oral Daily Nwoko, Nicole Kindred I, NP   40 mg at 04/24/23 0836   polyethylene glycol (MIRALAX / GLYCOLAX) packet 17 g  17 g Oral Daily PRN [name], NP   17 g at 04/13/23 1734   sertraline (ZOLOFT) tablet 150 mg  150 mg Oral Daily Massengill, Harrold Donath, MD   150 mg at 04/24/23 0837   SUMAtriptan (IMITREX) tablet 25 mg  25 mg Oral BID PRN [name], NP   25 mg  at 04/13/23 1243   traZODone (DESYREL) tablet 50 mg  50 mg Oral QHS [name], NP   50 mg at 04/23/23 2205   Vitamin D (Ergocalciferol) (DRISDOL) 1.25 MG (50000 UNIT) capsule 50,000 Units  50,000 Units Oral Q7 days [name], NP   50,000 Units at 04/20/23 1655   PTA Medications: Medications Prior to Admission  Medication Sig Dispense Refill Last Dose   busPIRone (BUSPAR) 15 MG tablet Take 15 mg by mouth 2 (two) times daily. (Patient not taking: Reported on 12/19/2022)      paliperidone (INVEGA SUSTENNA) 156 MG/ML SUSY injection Inject 156 mg into the muscle once. (Patient not taking: Reported on 12/19/2022)      sertraline (ZOLOFT) 50 MG tablet Take 150 mg by mouth daily. (Patient not taking: Reported on 12/19/2022)      traZODone (DESYREL) 100 MG tablet Take 100 mg by mouth at bedtime as needed for sleep. 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   Physician Treatment Plan for Primary Diagnosis: Schizoaffective disorder, depressive type (HCC) Long Term Goal(s): Improvement in symptoms so as ready for discharge   Short Term Goals: Ability to identify and develop effective coping behaviors will improve Ability to maintain clinical measurements within normal limits will improve Compliance with prescribed medications will improve Ability to identify triggers associated with substance abuse/mental health issues will improve Ability to identify changes in lifestyle to reduce recurrence of condition will improve Ability to verbalize feelings will improve Ability to disclose and discuss suicidal ideas Ability to demonstrate self-control will improve  Medication Management: Evaluate patient's response, side effects, and tolerance of medication regimen.  Therapeutic Interventions: 1 to 1 sessions, Unit Group sessions and Medication administration.  Evaluation of Outcomes: Progressing  Physician Treatment Plan for Secondary Diagnosis: Principal Problem:   Schizoaffective disorder, depressive type (HCC) Active Problems:   GERD (gastroesophageal reflux disease)   Intellectual disability   Tobacco use disorder  Long Term Goal(s): Improvement in symptoms so as ready for discharge   Short Term Goals: Ability to identify and develop effective coping behaviors will improve Ability to maintain clinical measurements within normal limits will improve Compliance with prescribed medications will improve Ability to identify triggers associated with substance abuse/mental health issues will improve Ability to identify changes in lifestyle to reduce recurrence of condition will improve Ability to verbalize feelings will improve Ability to disclose and discuss  suicidal ideas Ability to demonstrate self-control will improve     Medication Management: Evaluate patient's response, side effects, and tolerance of medication regimen.  Therapeutic Interventions: 1 to 1 sessions, Unit Group sessions and Medication administration.  Evaluation of Outcomes: Progressing   RN Treatment Plan for Primary Diagnosis: Schizoaffective disorder, depressive type (HCC) Long Term Goal(s): Knowledge of disease and therapeutic regimen to maintain health will improve  Short Term Goals: Ability to remain free from injury will improve, Ability to verbalize frustration and anger appropriately will improve, Ability to demonstrate self-control, Ability to participate in decision making will improve, Ability to verbalize feelings will improve, Ability to disclose and discuss suicidal ideas, Ability to identify and develop effective coping behaviors will improve, and Compliance with prescribed medications will improve  Medication Management: RN will administer medications as ordered by provider, will assess and evaluate  patient's response and provide education to patient for prescribed medication. RN will report any adverse and/or side effects to prescribing provider.  Therapeutic Interventions: 1 on 1 counseling sessions, Psychoeducation, Medication administration, Evaluate responses to treatment, Monitor vital signs and CBGs as ordered, Perform/monitor CIWA, COWS, AIMS and Fall Risk screenings as ordered, Perform wound care treatments as ordered.  Evaluation of Outcomes: Progressing   LCSW Treatment Plan for Primary Diagnosis: Schizoaffective disorder, depressive type (HCC) Long Term Goal(s): Safe transition to appropriate next level of care at discharge, Engage patient in therapeutic group addressing interpersonal concerns.  Short Term Goals: Engage patient in aftercare planning with referrals and resources, Increase social support, Increase ability to appropriately verbalize  feelings, Increase emotional regulation, Facilitate acceptance of mental health diagnosis and concerns, Facilitate patient progression through stages of change regarding substance use diagnoses and concerns, Identify triggers associated with mental health/substance abuse issues, and Increase skills for wellness and recovery  Therapeutic Interventions: Assess for all discharge needs, 1 to 1 time with Social worker, Explore available resources and support systems, Assess for adequacy in community support network, Educate family and significant other(s) on suicide prevention, Complete Psychosocial Assessment, Interpersonal group therapy.  Evaluation of Outcomes: Progressing   Progress in Treatment: Attending groups: Yes. Participating in groups: Yes. Taking medication as prescribed: Yes. Toleration medication: Yes. Family/Significant other contact made: Yes, individual(s) contacted:  Mother Lashaun Poch Patient understands diagnosis: Yes. Discussing patient identified problems/goals with staff: Yes. Medical problems stabilized or resolved: Yes. Denies suicidal/homicidal ideation: Yes. Issues/concerns per patient self-inventory: Yes. Other: N/A  New problem(s) identified: No, Describe:  None reported  New Short Term/Long Term Goal(s): medication stabilization, elimination of SI thoughts, development of comprehensive mental wellness plan.     Patient Goals:  "Housing"  Discharge Plan or Barriers: Patient recently admitted. CSW will continue to follow and assess for appropriate referrals and possible discharge planning.   Reason for Continuation of Hospitalization: Anxiety Depression Medication stabilization Suicidal ideation  Estimated Length of Stay: TBD  Last 3 [place] Suicide Severity Risk Score: Flowsheet Row Ad[facility] (Current) [facility] from [facility]19/2022 in [facility] Department [facility] ED from  [facility]/12/2022 in [facility] Emergency Department at [facility]  C-SSRS RISK CATEGORY High Risk High Risk Moderate Risk       Last [facility] 2/9 Scores:     No data to display         medication stabilization, elimination of SI thoughts, development of comprehensive mental wellness plan.   Scribe for Treatment Team: Ane Payment, LCSW 04/24/2023 1:30 PM

## 2023-04-24 NOTE — BHH Group Notes (Signed)
B[facility] Group Notes:  (Nursing/MHT/Case Management/Adjunct)   Group Topic/Focus:  Emotional Education:   The focus of this group is to discuss what feelings/emotions are, and how they are experienced. Goals Group:   The focus of this group is to help patients establish daily goals to achieve during treatment and discuss how the patient can incorporate goal setting into their daily lives to aide in recovery. Orientation:   The focus of this group is to educate the patient on the purpose and policies of crisis stabilization and provide a format to answer questions about their admission.  The group details unit policies and expectations of patients while admitted.  Participation Level:  Did Not Attend

## 2023-04-24 NOTE — Progress Notes (Signed)
   04/24/23 2230  Psych Admission Type (Psych Patients Only)  Admission Status Involuntary  Psychosocial Assessment  Patient Complaints Anxiety;Depression  Eye Contact Brief  Facial Expression Sad  Affect Sad  Speech Soft  Interaction Childlike  Motor Activity Slow  Appearance/Hygiene Disheveled  Behavior Characteristics Cooperative  Mood Pleasant  Thought Process  Coherency WDL  Content WDL  Delusions None reported or observed  Perception WDL  Hallucination None reported or observed  Judgment Poor  Confusion None  Danger to Self  Current suicidal ideation? Denies  Agreement Not to Harm Self Yes  Description of Agreement verbal  Danger to Others  Danger to Others None reported or observed

## 2023-04-24 NOTE — Progress Notes (Signed)
[facility] MD Progress Note  04/24/2023 8:49 AM Yolanda [name]  MRN:  [id number]  Principal Problem: Schizoaffective disorder, depressive type (HCC)  Diagnosis: Principal Problem:   Schizoaffective disorder, depressive type (HCC) Active Problems:   GERD (gastroesophageal reflux disease)   Intellectual disability   Tobacco use disorder  Reason for admission: This is the first psychiatric admission in this [facility] in 12 years for this 2 AA female with an extensive hx of mental illnesses & probable polysubstance use disorders. She is admitted to the [facility] from the [facility] hospital with complain of worsening suicidal ideations with plan to stab herself. Per chart review, patient apparently reported at the ED that she has been depressed for a while & has not been taking her mental health medications. After medical evaluation.clearance, she was transferred to the [facility] for further psychiatric evaluation/treatments.   Patient assessment note: Assessment from yesterday remains unchanged, pt continues to present with a flat affect and a depressed mood. Her attention to personal hygiene and grooming is remaining poor, and she continues to have no motivation to take care of personal hygiene. Eye contact is poor, speech is somewhat garbled, which is her baseline, but remains comprehensible. Thought contents are organized and logical, and pt currently denies SI/HI/AVH or paranoia. There is no evidence of delusional thoughts.  She reports a good sleep quality last night, reports a good appetite, continues to verbalize her frustration at this continuous hospitalization. Complaining of some vaginal itching, and UA ordered, and will await results.  Continuing all medications as listed below, symptoms currently stable for management outside of the hospital setting, but we continue to await placement by DSS, as cognitive limitations negatively impact upon patient's ability to function independently in a shelter type  environment.  She has no family members or friends at this time to provide her with housing until such a facility becomes available.  We will continue to follow.  Total Time spent with patient:  25 minutes  Past Psychiatric History:  See H & P  Past Medical History:  Past Medical History:  Diagnosis Date   Bipolar affect, depressed (HCC)    Constipation 08/17/2022   Depression    Falls 07/21/2022   Fracture of femoral neck, right, closed (HCC) 01/17/2022   Herpes simplex 08/22/2017   Open fracture dislocation of right elbow joint 01/17/2022    Past Surgical History:  Procedure Laterality Date   NO PAST SURGERIES     SALPINGECTOMY     Family History: History reviewed. No pertinent family history.  Family Psychiatric  History: See H & P  Social History:  Social History   Substance and Sexual Activity  Alcohol Use Yes     Social History   Substance and Sexual Activity  Drug Use Yes   Types: Cocaine, Marijuana    Social History   Socioeconomic History   Marital status: Single    Spouse name: Not on file   Number of children: Not on file   Years of education: Not on file   Highest education level: Not on file  Occupational History   Not on file  Tobacco Use   Smoking status: Every Day   Smokeless tobacco: Not on file  Substance and Sexual Activity   Alcohol use: Yes   Drug use: Yes    Types: Cocaine, Marijuana   Sexual activity: Yes  Other Topics Concern   Not on file  Social History Narrative   Not on file   Social Determinants of Health  Financial Resource Strain: Low Risk  (09/12/2022)   Received from [facility], [facility]   Overall Financial Resource Strain (CARDIA)    Difficulty of Paying Living Expenses: Not hard at all  Food Insecurity: Patient Declined (12/20/2022)   Hunger Vital Sign    Worried About Running Out of Food in the Last Year: Patient declined    Ran Out of Food in the Last Year: Patient declined  Transportation Needs: No  Transportation Needs (12/20/2022)   PRAPARE - Administrator, Civil Service (Medical): No    Lack of Transportation (Non-Medical): No  Physical Activity: Not on file  Stress: No Stress Concern Present (07/17/2022)   Received from [facility], [facility] - Occupational Stress Questionnaire    Feeling of Stress : Not at all  Social Connections: Unknown (07/16/2022)   Received from [facility], [facility]   Social Network    Social Network: Not on file   Sleep: Good  Appetite:  Good  Current Medications: Current Facility-Administered Medications  Medication Dose Route Frequency Provider Last Rate Last Admin   acetaminophen (TYLENOL) tablet 650 mg  650 mg Oral Q6H PRN [name], NP   650 mg at 04/22/23 1304   alum & mag hydroxide-simeth (MAALOX/MYLANTA) 200-200-20 MG/5ML suspension 30 mL  30 mL Oral Q4H PRN [name], NP   30 mL at 01/28/23 1530   busPIRone (BUSPAR) tablet 15 mg  15 mg Oral BID [name], NP   15 mg at 04/24/23 0836   cyanocobalamin (VITAMIN B12) injection 1,000 mcg  1,000 mcg Intramuscular Q30 days [name], Nadir, MD   1,000 mcg at 04/03/23 1237   diphenhydrAMINE (BENADRYL) capsule 50 mg  50 mg Oral TID PRN [name], NP   50 mg at 01/15/23 1211   Or   diphenhydrAMINE (BENADRYL) injection 50 mg  50 mg Intramuscular TID PRN [name], NP       docusate sodium (COLACE) capsule 100 mg  100 mg Oral Daily Moranda Billiot, Tyler Aas, NP   100 mg at 04/24/23 0836   feeding supplement (ENSURE ENLIVE / ENSURE PLUS) liquid 237 mL  237 mL Oral TID BM Ala Kratz, Tyler Aas, NP   237 mL at 04/23/23 2209   haloperidol (HALDOL) tablet 5 mg  5 mg Oral TID PRN [name], NP   5 mg at 01/15/23 1211   Or   haloperidol lactate (HALDOL) injection 5 mg  5 mg Intramuscular TID PRN [name], NP       hydrOXYzine (ATARAX) tablet 25 mg  25 mg Oral TID PRN [name], DO   25 mg at 04/20/23 2104   LORazepam (ATIVAN) tablet  2 mg  2 mg Oral TID PRN [name], NP   2 mg at 01/15/23 1211   Or   LORazepam (ATIVAN) injection 2 mg  2 mg Intramuscular TID PRN [name], NP       magnesium hydroxide (MILK OF MAGNESIA) suspension 30 mL  30 mL Oral Daily PRN [name], NP   30 mL at 04/13/23 0814   melatonin tablet 5 mg  5 mg Oral QHS Meerab Maselli, NP   5 mg at 04/23/23 2205   nicotine (NICODERM CQ - dosed in mg/24 hours) patch 21 mg  21 mg Transdermal Daily [name], DO   21 mg at 03/25/23 1610   nicotine polacrilex (NICORETTE) gum 2 mg  2 mg Oral PRN [name], MD  2 mg at 04/23/23 1202   paliperidone (INVEGA) 24 hr tablet 6 mg  6 mg Oral Daily [name], NP   6 mg at 04/23/23 2205   pantoprazole (PROTONIX) EC tablet 40 mg  40 mg Oral Daily [name], NP   40 mg at 04/24/23 0836   polyethylene glycol (MIRALAX / GLYCOLAX) packet 17 g  17 g Oral Daily PRN [name], NP   17 g at 04/13/23 1734   sertraline (ZOLOFT) tablet 150 mg  150 mg Oral Daily Massengill, Harrold Donath, MD   150 mg at 04/24/23 6045   SUMAtriptan (IMITREX) tablet 25 mg  25 mg Oral BID PRN [name], NP   25 mg at 04/13/23 1243   traZODone (DESYREL) tablet 50 mg  50 mg Oral QHS [name], NP   50 mg at 04/23/23 2205   Vitamin D (Ergocalciferol) (DRISDOL) 1.25 MG (50000 UNIT) capsule 50,000 Units  50,000 Units Oral Q7 days [name], NP   50,000 Units at 04/20/23 1655   Lab Results:  Results for orders placed or performed during the hospital encounter of 12/20/22 (from the past 48 hour(s))  Urinalysis, Routine w reflex microscopic -Urine, Clean Catch     Status: Abnormal   Collection Time: 04/24/23 12:19 AM  Result Value Ref Range   Color, Urine STRAW (A) YELLOW   APPearance CLEAR CLEAR   Specific Gravity, Urine 1.010 1.005 - 1.030   pH 6.0 5.0 - 8.0   Glucose, UA NEGATIVE NEGATIVE mg/dL   Hgb urine dipstick NEGATIVE NEGATIVE   Bilirubin Urine NEGATIVE NEGATIVE   Ketones, ur NEGATIVE NEGATIVE mg/dL    Protein, ur NEGATIVE NEGATIVE mg/dL   Nitrite NEGATIVE NEGATIVE   Leukocytes,Ua MODERATE (A) NEGATIVE   RBC / HPF 0-5 0 - 5 RBC/hpf   WBC, UA 11-20 0 - 5 WBC/hpf   Bacteria, UA RARE (A) NONE SEEN   Squamous Epithelial / HPF 0-5 0 - 5 /HPF   Mucus PRESENT     Comment: Performed at [facility], [address], [place], [place] [address]      Blood Alcohol level:  Lab Results  Component Value Date   [facility] <10 12/19/2022   ETH <10 11/21/2019   Metabolic Disorder Labs: Lab Results  Component Value Date   HGBA1C 4.4 (L) 12/21/2022   MPG 79.58 12/21/2022   No results found for: "PROLACTIN" Lab Results  Component Value Date   CHOL 218 (H) 12/21/2022   TRIG 81 12/21/2022   HDL 53 12/21/2022   CHOLHDL 4.1 12/21/2022   VLDL 16 12/21/2022   LDLCALC 149 (H) 12/21/2022   LDLCALC 110 (H) 03/13/2011   Physical Findings: AIMS: Facial and Oral Movements Muscles of Facial Expression: None Lips and Perioral Area: None Jaw: None Tongue: None,Extremity Movements Upper (arms, wrists, hands, fingers): None Lower (legs, knees, ankles, toes): None, Trunk Movements Neck, shoulders, hips: None, Global Judgements Severity of abnormal movements overall : None Incapacitation due to abnormal movements: None Patient's awareness of abnormal movements: No Awareness, Dental Status Current problems with teeth and/or dentures?: No Does patient usually wear dentures?: No  CIWA:    COWS:    AIMS:0 Musculoskeletal: Strength & Muscle Tone: within normal limits Gait & Station: normal Patient leans: N/A  Psychiatric Specialty Exam:  Presentation  General Appearance:  Disheveled  Eye Contact: Limited to baseline Speech: Decreased amount, decreased tone and volume but at baseline Speech Volume: Normal  Handedness: Right  Mood and Affect  Mood: Sad  and depressed mainly secondary to being in the hospital with no place to go Affect: Congruent  Thought Process   Thought Processes: Coherent  Descriptions of Associations:Intact  Orientation:Partial  Thought Content:Logical Concrete History of Schizophrenia/Schizoaffective disorder:Yes  Duration of Psychotic Symptoms:Greater than six months  Hallucinations:No data recorded    Ideas of Reference: None  Suicidal Thoughts:No data recorded    Homicidal Thoughts:No data recorded   Sensorium  Memory: Recent Fair  Judgment: Impaired  Insight: Fair  Chartered certified accountant: Fair  Attention Span: Fair  Recall: Poor  Fund of Knowledge: Fair  Language: Fair  Psychomotor Activity  Psychomotor Activity: No data recorded      Assets  Assets: Resilience  Sleep  Sleep: No data recorded    Physical Exam: Physical Exam Vitals and nursing note reviewed.  HENT:     Nose: Nose normal.     Mouth/Throat:     Pharynx: Oropharynx is clear.  Eyes:     Pupils: Pupils are equal, round, and reactive to light.  Cardiovascular:     Rate and Rhythm: Normal rate.     Pulses: Normal pulses.  Pulmonary:     Effort: Pulmonary effort is normal.  Genitourinary:    Comments: Deferred Musculoskeletal:        General: Normal range of motion.     Cervical back: Normal range of motion.  Skin:    General: Skin is warm and dry.  Neurological:     General: No focal deficit present.     Mental Status: She is alert and oriented to person, place, and time.  Psychiatric:        Mood and Affect: Mood normal.        Behavior: Behavior normal.        Thought Content: Thought content normal.    Review of Systems  Constitutional:  Negative for chills, diaphoresis and fever.  HENT:  Negative for congestion, hearing loss and sore throat.   Eyes:  Negative for blurred vision.  Respiratory:  Negative for cough, shortness of breath and wheezing.   Cardiovascular:  Negative for chest pain and palpitations.  Gastrointestinal:  Negative for abdominal pain, constipation,  diarrhea, heartburn, nausea and vomiting.  Genitourinary:  Negative for dysuria.  Musculoskeletal:  Negative for joint pain and myalgias.  Skin:  Negative for rash.  Neurological:  Negative for dizziness.  Psychiatric/Behavioral:  Positive for depression. Negative for hallucinations, memory loss, substance abuse and suicidal ideas. The patient is nervous/anxious and has insomnia.   All other systems reviewed and are negative.  Blood pressure 104/71, pulse (!) 56, temperature 97.8 F (36.6 C), temperature source Oral, resp. rate 18, height 4\' 11"  (1.499 m), weight 63 kg, SpO2 99%. Body mass index is 28.05 kg/m.  Treatment Plan Summary: Daily contact with patient to assess and evaluate symptoms and progress in treatment and Medication management.    Still waiting on safe disposition as patient would not be able to live independently given cognitive impairment. Recommend assisted living.    No medication side effects reported.  Because of urinary incontinence, recommended adult diapers to be worn.   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  Plan:  -Continue Imitrex 25 mg PRN BID for migraines -Continue Colace 100 mg daily for constipation -Continue Vitamin D 50.000 units weekly for bone health. -Continue Sertraline150 mg po Q daily  for depression/anxiety -Continue Buspar 15 mg po bid for anxiety.  -Continue paliperidone 6 mg po qd for mood stabilization -Continue Trazodone 50 mg nightly for insomnia  -Continue Nicoderm 21 mg topically Q 24 hrs for nicotine withdrawal management. -Continue vitamin B12 1000 mcg IM weekly for 4 weeks then monthly -Continue Melatonin 5 mg nightly for sleep -Continue Protonix EC 40 mg p.o. daily for GERD -Continue  MiraLAX 17 g p.o. PRN for constipation -Continue hydroxyzine 25 mg p.o. 3 times daily as needed for anxiety -Continue Ensure nutritional shakes TID in between meals  -Previously discontinued Hydroxyzine 50 mg nightly-hypotension in the mornings   Safety and Monitoring: Voluntary admission to inpatient psychiatric unit for safety, stabilization and treatment Daily contact with patient to assess and evaluate symptoms and progress in treatment Patient's case to be discussed in multi-disciplinary team meeting Observation Level : q15 minute checks Vital signs: q12 hours Precautions: Safety   Discharge Planning: Social work and case management to assist with discharge planning and identification of hospital follow-up needs prior to discharge Estimated LOS: Unknown at this time. Discharge Concerns: Need to establish a safety plan; Medication compliance and effectiveness Discharge Goals: Return home with outpatient referrals for mental health follow-up including medication management/psychotherapy No legal guardian, has payee  [name], NP,  04/24/2023, 8:49 AM Patient ID: Yolanda [name], female   DOB: [birth year], 47 y.o.   MRN: [id number]  Patient ID: JESSICE MADILL, female   DOB: May 04, 1976, 47 y.o.   MRN: [id number]

## 2023-04-24 NOTE — Plan of Care (Signed)
  Problem: Education: Goal: Utilization of techniques to improve thought processes will improve Outcome: Adequate for Discharge   Problem: Education: Goal: Knowledge of the prescribed therapeutic regimen will improve Outcome: Adequate for Discharge   Problem: Activity: Goal: Interest or engagement in leisure activities will improve Outcome: Adequate for Discharge   Problem: Coping: Goal: Coping ability will improve Outcome: Adequate for Discharge

## 2023-04-24 NOTE — Progress Notes (Signed)
D- Patient alert and oriented, affect/mood, sad depressed and tearful. Denies SI, HI, AVH, and pain. Pt sitting on her bed making reference to bible versus and began crying but, unable to articulate why she was crying "It all my fault, that's what it is" A- Scheduled medications administered to patient, per MD orders. Support and encouragement provided.  Routine safety checks conducted every 15 minutes.  Patient informed to notify staff with problems or concerns. R- No adverse drug reactions noted. Patient contracts for safety at this time. Patient compliant with medications and treatment plan. Patient receptive, calm, and cooperative. Patient interacts well with others on the unit.  Patient remains safe at this time.

## 2023-04-24 NOTE — Progress Notes (Signed)
   04/24/23 0900  Psych Admission Type (Psych Patients Only)  Admission Status Involuntary  Psychosocial Assessment  Patient Complaints None  Eye Contact Brief  Facial Expression Sad  Affect Sad  Speech Soft  Interaction Childlike  Motor Activity Slow  Appearance/Hygiene Disheveled  Behavior Characteristics Cooperative  Mood Pleasant  Thought Process  Coherency WDL  Content WDL  Delusions None reported or observed  Perception WDL  Hallucination None reported or observed  Judgment Poor  Confusion None  Danger to Self  Current suicidal ideation? Denies  Agreement Not to Harm Self Yes  Description of Agreement verbal  Danger to Others  Danger to Others None reported or observed

## 2023-04-24 NOTE — [facility] Group Notes (Signed)
Adult Psychoeducational Group Note  Date:  04/24/2023 Time:  9:18 PM  Group Topic/Focus:  Wrap-Up Group:   The focus of this group is to help patients review their daily goal of treatment and discuss progress on daily workbooks.  Participation Level:  Active  Participation Quality:  Attentive  Affect:  Appropriate  Cognitive:  Alert  Insight: Improving  Engagement in Group:  Engaged  Modes of Intervention:  Discussion  Additional Comments:  Pt attended and participated in group  Maura Crandall Cassandra 04/24/2023, 9:18 PM

## 2023-04-24 NOTE — Progress Notes (Signed)
[facility] MD Progress Note  04/24/2023 3:13 PM Yolanda [name]  MRN:  [id number]  Principal Problem: Schizoaffective disorder, depressive type (HCC)  Diagnosis: Principal Problem:   Schizoaffective disorder, depressive type (HCC) Active Problems:   GERD (gastroesophageal reflux disease)   Intellectual disability   Tobacco use disorder  Reason for admission: This is the first psychiatric admission in this [facility] in 12 years for this 16 AA female with an extensive hx of mental illnesses & probable polysubstance use disorders. She is admitted to the [facility] from the [facility] hospital with complain of worsening suicidal ideations with plan to stab herself. Per chart review, patient apparently reported at the ED that she has been depressed for a while & has not been taking her mental health medications. After medical evaluation.clearance, she was transferred to the [facility] for further psychiatric evaluation/treatments.   24 hour chart review: Vitals within normal limits, with exception of heart rate which was slightly low at 56 earlier today morning.  Patient continues to stay isolative to her room earlier in the day, but is visible in the day room and around the unit as the afternoon approaches.  Continuing to require positive reinforcements to take care of personal hygiene needs.  No behavioral episodes in the past 24 hours, did not require any PRNs for agitation or anything else.  Patient assessment note: Mood remains depressed, affect is congruent.  Patient denies SI/HI/AVH, denies paranoia, and there is no evidence of delusional thinking.  Reports a good sleep quality last night, but was tired earlier today morning, requiring multiple positive reinforcements, to open up her eyes and continue to engage in assessment.  Patient has been educated on the need to engage in group sessions, while waiting for placement, and also on the need to take care of personal hygiene, and has verbalized understanding.  She  denies being in any physical pain today, denies any issues with her bowel movements, states the last one was yesterday.   All medications being continued as listed below. [name] symptoms have been stable for management outside of the hospital setting for multiple weeks now, but we continue to await placement by DSS. [name] cognitive limitations negatively impact upon patient's ability to function independently in a shelter type environment.  She has no family members or friends at this time to provide her with housing until such a facility becomes available.  We will continue to follow.  Total Time spent with patient:  25 minutes  Past Psychiatric History:  See H & P  Past Medical History:  Past Medical History:  Diagnosis Date   Bipolar affect, depressed (HCC)    Constipation 08/17/2022   Depression    Falls 07/21/2022   Fracture of femoral neck, right, closed (HCC) 01/17/2022   Herpes simplex 08/22/2017   Open fracture dislocation of right elbow joint 01/17/2022    Past Surgical History:  Procedure Laterality Date   NO PAST SURGERIES     SALPINGECTOMY     Family History: History reviewed. No pertinent family history.  Family Psychiatric  History: See H & P  Social History:  Social History   Substance and Sexual Activity  Alcohol Use Yes     Social History   Substance and Sexual Activity  Drug Use Yes   Types: Cocaine, Marijuana    Social History   Socioeconomic History   Marital status: Single    Spouse name: Not on file   Number of children: Not on file   Years of education:  Not on file   Highest education level: Not on file  Occupational History   Not on file  Tobacco Use   Smoking status: Every Day   Smokeless tobacco: Not on file  Substance and Sexual Activity   Alcohol use: Yes   Drug use: Yes    Types: Cocaine, Marijuana   Sexual activity: Yes  Other Topics Concern   Not on file  Social History Narrative   Not on file   Social Determinants of Health    Financial Resource Strain: Low Risk  (09/12/2022)   Received from [facility], [facility]   Overall Financial Resource Strain (CARDIA)    Difficulty of Paying Living Expenses: Not hard at all  Food Insecurity: Patient Declined (12/20/2022)   Hunger Vital Sign    Worried About Running Out of Food in the Last Year: Patient declined    Ran Out of Food in the Last Year: Patient declined  Transportation Needs: No Transportation Needs (12/20/2022)   PRAPARE - Administrator, Civil Service (Medical): No    Lack of Transportation (Non-Medical): No  Physical Activity: Not on file  Stress: No Stress Concern Present (07/17/2022)   Received from [facility], [facility] of Occupational Health - Occupational Stress Questionnaire    Feeling of Stress : Not at all  Social Connections: Unknown (07/16/2022)   Received from [facility], [facility]   Social Network    Social Network: Not on file   Sleep: Good  Appetite:  Good  Current Medications: Current Facility-Administered Medications  Medication Dose Route Frequency Provider Last Rate Last Admin   acetaminophen (TYLENOL) tablet 650 mg  650 mg Oral Q6H PRN [name], NP   650 mg at 04/22/23 1304   alum & mag hydroxide-simeth (MAALOX/MYLANTA) 200-200-20 MG/5ML suspension 30 mL  30 mL Oral Q4H PRN [name], NP   30 mL at 01/28/23 1530   busPIRone (BUSPAR) tablet 15 mg  15 mg Oral BID [name], NP   15 mg at 04/24/23 0836   cyanocobalamin (VITAMIN B12) injection 1,000 mcg  1,000 mcg Intramuscular Q30 days Sarita Bottom, MD   1,000 mcg at 04/03/23 1237   diphenhydrAMINE (BENADRYL) capsule 50 mg  50 mg Oral TID PRN [name], NP   50 mg at 01/15/23 1211   Or   diphenhydrAMINE (BENADRYL) injection 50 mg  50 mg Intramuscular TID PRN [name], NP       docusate sodium (COLACE) capsule 100 mg  100 mg Oral Daily Gjon Letarte, Tyler Aas, NP   100 mg at 04/24/23 0836   feeding supplement (ENSURE  ENLIVE / ENSURE PLUS) liquid 237 mL  237 mL Oral TID BM Dijon Cosens, NP   237 mL at 04/24/23 1401   haloperidol (HALDOL) tablet 5 mg  5 mg Oral TID PRN [name], NP   5 mg at 01/15/23 1211   Or   haloperidol lactate (HALDOL) injection 5 mg  5 mg Intramuscular TID PRN [name], NP       hydrOXYzine (ATARAX) tablet 25 mg  25 mg Oral TID PRN [name], DO   25 mg at 04/20/23 2104   LORazepam (ATIVAN) tablet 2 mg  2 mg Oral TID PRN [name], NP   2 mg at 01/15/23 1211   Or   LORazepam (ATIVAN) injection 2 mg  2 mg Intramuscular TID PRN [name], NP       magnesium hydroxide (MILK OF  MAGNESIA) suspension 30 mL  30 mL Oral Daily PRN [name], NP   30 mL at 04/13/23 2595   melatonin tablet 5 mg  5 mg Oral QHS [name], NP   5 mg at 04/23/23 2205   nicotine (NICODERM CQ - dosed in mg/24 hours) patch 21 mg  21 mg Transdermal Daily [name], DO   21 mg at 03/25/23 6387   nicotine polacrilex (NICORETTE) gum 2 mg  2 mg Oral PRN [name], MD   2 mg at 04/23/23 1202   paliperidone (INVEGA) 24 hr tablet 6 mg  6 mg Oral Daily [name], NP   6 mg at 04/23/23 2205   pantoprazole (PROTONIX) EC tablet 40 mg  40 mg Oral Daily [name], NP   40 mg at 04/24/23 0836   polyethylene glycol (MIRALAX / GLYCOLAX) packet 17 g  17 g Oral Daily PRN [name], NP   17 g at 04/13/23 1734   sertraline (ZOLOFT) tablet 150 mg  150 mg Oral Daily Massengill, Harrold Donath, MD   150 mg at 04/24/23 5643   SUMAtriptan (IMITREX) tablet 25 mg  25 mg Oral BID PRN [name], NP   25 mg at 04/13/23 1243   traZODone (DESYREL) tablet 50 mg  50 mg Oral QHS [name], NP   50 mg at 04/23/23 2205   Vitamin D (Ergocalciferol) (DRISDOL) 1.25 MG (50000 UNIT) capsule 50,000 Units  50,000 Units Oral Q7 days [name], NP   50,000 Units at 04/20/23 1655   Lab Results:  Results for orders placed or performed during the hospital encounter of 12/20/22 (from the past 48 hour(s))   Urinalysis, Routine w reflex microscopic -Urine, Clean Catch     Status: Abnormal   Collection Time: 04/24/23 12:19 AM  Result Value Ref Range   Color, Urine STRAW (A) YELLOW   APPearance CLEAR CLEAR   Specific Gravity, Urine 1.010 1.005 - 1.030   pH 6.0 5.0 - 8.0   Glucose, UA NEGATIVE NEGATIVE mg/dL   Hgb urine dipstick NEGATIVE NEGATIVE   Bilirubin Urine NEGATIVE NEGATIVE   Ketones, ur NEGATIVE NEGATIVE mg/dL   Protein, ur NEGATIVE NEGATIVE mg/dL   Nitrite NEGATIVE NEGATIVE   Leukocytes,Ua MODERATE (A) NEGATIVE   RBC / HPF 0-5 0 - 5 RBC/hpf   WBC, UA 11-20 0 - 5 WBC/hpf   Bacteria, UA RARE (A) NONE SEEN   Squamous Epithelial / HPF 0-5 0 - 5 /HPF   Mucus PRESENT     Comment: Performed at [facility], [address] [place], [place] [address]      Blood Alcohol level:  Lab Results  Component Value Date   [facility] <10 12/19/2022   ETH <10 11/21/2019   Metabolic Disorder Labs: Lab Results  Component Value Date   HGBA1C 4.4 (L) 12/21/2022   MPG 79.58 12/21/2022   No results found for: "PROLACTIN" Lab Results  Component Value Date   CHOL 218 (H) 12/21/2022   TRIG 81 12/21/2022   HDL 53 12/21/2022   CHOLHDL 4.1 12/21/2022   VLDL 16 12/21/2022   LDLCALC 149 (H) 12/21/2022   LDLCALC 110 (H) 03/13/2011   Physical Findings: AIMS: Facial and Oral Movements Muscles of Facial Expression: None Lips and Perioral Area: None Jaw: None Tongue: None,Extremity Movements Upper (arms, wrists, hands, fingers): None Lower (legs, knees, ankles, toes): None, Trunk Movements Neck, shoulders, hips: None, Global Judgements Severity of abnormal movements overall : None Incapacitation due to abnormal movements: None  Patient's awareness of abnormal movements: No Awareness, Dental Status Current problems with teeth and/or dentures?: No Does patient usually wear dentures?: No  CIWA:    COWS:    AIMS:0 Musculoskeletal: Strength & Muscle Tone: within normal  limits Gait & Station: normal Patient leans: N/A  Psychiatric Specialty Exam:  Presentation  General Appearance:  Disheveled  Eye Contact: Limited to baseline Speech: Decreased amount, decreased tone and volume but at baseline Speech Volume: Normal  Handedness: Right  Mood and Affect  Mood: Sad and depressed mainly secondary to being in the hospital with no place to go Affect: Congruent  Thought Process  Thought Processes: Coherent  Descriptions of Associations:Intact  Orientation:Partial  Thought Content:Logical [place]crete History of Schizophrenia/Schizoaffective disorder:Yes  Duration of Psychotic Symptoms:Greater than six months  Hallucinations:Hallucinations: None     Ideas of Reference: None  Suicidal Thoughts:Suicidal Thoughts: No     Homicidal Thoughts:Homicidal Thoughts: No    Sensorium  Memory: Immediate Good  Judgment: Impaired  Insight: Fair  Chartered certified accountant: Fair  Attention Span: Fair  Recall: Poor  Fund of Knowledge: Poor  Language: Fair  Psychomotor Activity  Psychomotor Activity: Psychomotor Activity: Normal     Assets  Assets: Resilience  Sleep  Sleep: Sleep: Good     Physical Exam: Physical Exam Vitals and nursing note reviewed.  HENT:     Nose: Nose normal.     Mouth/Throat:     Pharynx: Oropharynx is clear.  Eyes:     Pupils: Pupils are equal, round, and reactive to light.  Cardiovascular:     Rate and Rhythm: Normal rate.     Pulses: Normal pulses.  Pulmonary:     Effort: Pulmonary effort is normal.  Genitourinary:    Comments: Deferred Musculoskeletal:        General: Normal range of motion.     Cervical back: Normal range of motion.  Skin:    General: Skin is warm and dry.  Neurological:     General: No focal deficit present.     Mental Status: She is alert and oriented to person, place, and time.  Psychiatric:        Mood and Affect: Mood normal.         Behavior: Behavior normal.        Thought Content: Thought content normal.    Review of Systems  Constitutional:  Negative for chills, diaphoresis and fever.  HENT:  Negative for congestion, hearing loss and sore throat.   Eyes:  Negative for blurred vision.  Respiratory:  Negative for cough, shortness of breath and wheezing.   Cardiovascular:  Negative for chest pain and palpitations.  Gastrointestinal:  Negative for abdominal pain, constipation, diarrhea, heartburn, nausea and vomiting.  Genitourinary:  Negative for dysuria.  Musculoskeletal:  Negative for joint pain and myalgias.  Skin:  Negative for rash.  Neurological:  Negative for dizziness.  Psychiatric/Behavioral:  Positive for depression. Negative for hallucinations, memory loss, substance abuse and suicidal ideas. The patient is nervous/anxious and has insomnia.   All other systems reviewed and are negative.  Blood pressure 104/71, pulse (!) 56, temperature 97.8 F (36.6 C), temperature source Oral, resp. rate 18, height 4\' 11"  (1.499 m), weight 63 kg, SpO2 99%. Body mass index is 28.05 kg/m.  Treatment Plan Summary: Daily contact with patient to assess and evaluate symptoms and progress in treatment and Medication management.    Still waiting on safe disposition as patient would not be able to live independently given cognitive impairment.  Recommend assisted living.    No medication side effects reported.  Because of urinary incontinence, recommended adult diapers to be worn.   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  Plan:  -Continue Imitrex 25 mg PRN BID for migraines -Continue Colace 100 mg daily for constipation -Continue Vitamin D 50.000 units weekly for bone  health. -Continue Sertraline150 mg po Q daily for depression/anxiety -Continue Buspar 15 mg po bid for anxiety.  -Continue paliperidone 6 mg po qd for mood stabilization -Continue Trazodone 50 mg nightly for insomnia  -Continue Nicoderm 21 mg topically Q 24 hrs for nicotine withdrawal management. -Continue vitamin B12 1000 mcg IM weekly for 4 weeks then monthly -Continue Melatonin 5 mg nightly for sleep -Continue Protonix EC 40 mg p.o. daily for GERD -Continue MiraLAX 17 g p.o. PRN for constipation -Continue hydroxyzine 25 mg p.o. 3 times daily as needed for anxiety -Continue Ensure nutritional shakes TID in between meals  -Previously discontinued Hydroxyzine 50 mg nightly-hypotension in the mornings   Safety and Monitoring: Voluntary admission to inpatient psychiatric unit for safety, stabilization and treatment Daily contact with patient to assess and evaluate symptoms and progress in treatment Patient's case to be discussed in multi-disciplinary team meeting Observation Level : q15 minute checks Vital signs: q12 hours Precautions: Safety   Discharge Planning: Social work and case management to assist with discharge planning and identification of hospital follow-up needs prior to discharge Estimated LOS: Unknown at this time. Discharge Concerns: Need to establish a safety plan; Medication compliance and effectiveness Discharge Goals: Return home with outpatient referrals for mental health follow-up including medication management/psychotherapy No legal guardian, has payee  [name], NP,  04/24/2023, 3:13 PM Patient ID: Larey Brick, female   DOB: [birth year], 47 y.o.   MRN: [id number]  Patient ID: GIABELLA DUHART, female   DOB: Jun 21, 1976, 47 y.o.   MRN: [id number] Patient ID: EVADNA DONAGHY, female   DOB: Oct 26, 1975, 46 y.o.   MRN: [id number]

## 2023-04-24 NOTE — [facility] Group Notes (Signed)
Spiritual care group facilitated by Chaplain Katy Denessa Cavan, B[facility]  Group focused on topic of strength. Group members reflected on what thoughts and feelings emerge when they hear this topic. They then engaged in facilitated dialog around how strength is present in their lives. This dialog focused on representing what strength had been to them in their lives (images and patterns given) and what they saw as helpful in their life now (what they needed / wanted).  Activity drew on narrative framework.  Patient Progress: Did not attend.  

## 2023-04-25 DIAGNOSIS — F251 Schizoaffective disorder, depressive type: Secondary | ICD-10-CM | POA: Diagnosis not present

## 2023-04-25 NOTE — Plan of Care (Signed)
  Problem: Education: Goal: Knowledge of the prescribed therapeutic regimen will improve Outcome: Progressing   Problem: Activity: Goal: Interest or engagement in leisure activities will improve Outcome: Progressing Goal: Imbalance in normal sleep/wake cycle will improve Outcome: Progressing   Problem: Coping: Goal: Coping ability will improve Outcome: Progressing Goal: Will verbalize feelings Outcome: Progressing   Problem: Health Behavior/Discharge Planning: Goal: Ability to make decisions will improve Outcome: Progressing

## 2023-04-25 NOTE — Group Note (Signed)
[facility] LCSW Group Therapy Note   Group Date: 04/25/2023 Start Time: 1100 End Time: 1145  Type of Therapy/Topic:  Group Therapy:  Feelings about Diagnosis  Participation Level:  Did Not Attend     Description of Group:    This group will allow patients to explore their thoughts and feelings about diagnoses they have received. Patients will be guided to explore their level of understanding and acceptance of these diagnoses. Facilitator will encourage patients to process their thoughts and feelings about the reactions of others to their diagnosis, and will guide patients in identifying ways to discuss their diagnosis with significant others in their lives. This group will be process-oriented, with patients participating in exploration of their own experiences as well as giving and receiving support and challenge from other group members.   Therapeutic Goals: 1. Patient will demonstrate understanding of diagnosis as evidence by identifying two or more symptoms of the disorder:  2. Patient will be able to express two feelings regarding the diagnosis 3. Patient will demonstrate ability to communicate their needs through discussion and/or role plays  Summary of Patient Progress:    N/A    Therapeutic Modalities:   Cognitive Behavioral Therapy Brief Therapy Feelings Identification    Linn Clavin S Beila Purdie, LCSW

## 2023-04-25 NOTE — Progress Notes (Signed)
   04/25/23 1000  Psych Admission Type (Psych Patients Only)  Admission Status Involuntary  Psychosocial Assessment  Patient Complaints None  Eye Contact Brief  Facial Expression Sad  Affect Sad  Speech Soft  Interaction Childlike  Motor Activity Slow  Appearance/Hygiene Disheveled  Behavior Characteristics Cooperative  Mood Pleasant  Thought Process  Coherency WDL  Content WDL  Delusions None reported or observed  Perception WDL  Hallucination None reported or observed  Judgment Poor  Confusion None  Danger to Self  Current suicidal ideation? Denies  Agreement Not to Harm Self Yes  Description of Agreement verbal  Danger to Others  Danger to Others None reported or observed

## 2023-04-25 NOTE — Progress Notes (Signed)
[facility] MD Progress Note  04/25/2023 9:51 AM Yolanda [name]  MRN:  [id number]  Principal Problem: Schizoaffective disorder, depressive type (HCC)  Diagnosis: Principal Problem:   Schizoaffective disorder, depressive type (HCC) Active Problems:   GERD (gastroesophageal reflux disease)   Intellectual disability   Tobacco use disorder  Reason for admission: This is the first psychiatric admission in this [facility] in 12 years for this 47 AA female with an extensive hx of mental illnesses & probable polysubstance use disorders. She is admitted to the [facility] from the [facility] hospital with complain of worsening suicidal ideations with plan to stab herself. Per chart review, patient apparently reported at the ED that she has been depressed for a while & has not been taking her mental health medications. After medical evaluation.clearance, she was transferred to the [facility] for further psychiatric evaluation/treatments.   24 hour chart review: The patient was seen and evaluated and chart was reviewed and the case was discussed with nursing staff.  Staff reports that patient has been sleeping fairly well and reports that 6.5 hours of sleep.  She does remain isolated to the room but in general has been doing well with no side effects.  Her pulse rate is now 75 and blood pressure remains low at 92/65.  However she denies any symptoms.  No behavioral episodes noted.   Patient assessment note: Mood remains alert oriented and cooperative.  Her affect is somewhat depressive but in general denies any SI/HI/AVH.  She denies paranoia and she denies any delusional thinking.  Her sleep is fair to good.  She continues to work on her GED and is in general compliant and cooperative. She is awaiting placement and no significant changes in medications have been made.  Will continue to follow.  Total Time spent with patient:  25 minutes  Past Psychiatric History:  See H & P  Past Medical History:  Past Medical History:   Diagnosis Date   Bipolar affect, depressed (HCC)    Constipation 08/17/2022   Depression    Falls 07/21/2022   Fracture of femoral neck, right, closed (HCC) 01/17/2022   Herpes simplex 08/22/2017   Open fracture dislocation of right elbow joint 01/17/2022    Past Surgical History:  Procedure Laterality Date   NO PAST SURGERIES     SALPINGECTOMY     Family History: History reviewed. No pertinent family history.  Family Psychiatric  History: See H & P  Social History:  Social History   Substance and Sexual Activity  Alcohol Use Yes     Social History   Substance and Sexual Activity  Drug Use Yes   Types: Cocaine, Marijuana    Social History   Socioeconomic History   Marital status: Single    Spouse name: Not on file   Number of children: Not on file   Years of education: Not on file   Highest education level: Not on file  Occupational History   Not on file  Tobacco Use   Smoking status: Every Day   Smokeless tobacco: Not on file  Substance and Sexual Activity   Alcohol use: Yes   Drug use: Yes    Types: Cocaine, Marijuana   Sexual activity: Yes  Other Topics Concern   Not on file  Social History Narrative   Not on file   Social Determinants of Health   Financial Resource Strain: Low Risk  (09/12/2022)   Received from [facility], [facility]   Overall Financial Resource Strain (CARDIA)  Difficulty of Paying Living Expenses: Not hard at all  Food Insecurity: Patient Declined (12/20/2022)   Hunger Vital Sign    Worried About Running Out of Food in the Last Year: Patient declined    Ran Out of Food in the Last Year: Patient declined  Transportation Needs: No Transportation Needs (12/20/2022)   PRAPARE - Administrator, Civil Service (Medical): No    Lack of Transportation (Non-Medical): No  Physical Activity: Not on file  Stress: No Stress Concern Present (07/17/2022)   Received from [facility], [facility]  Occupational Health - Occupational Stress Questionnaire    Feeling of Stress : Not at all  Social Connections: Unknown (07/16/2022)   Received from [facility], [facility]   Social Network    Social Network: Not on file   Sleep: Good  Appetite:  Good  Current Medications: Current Facility-Administered Medications  Medication Dose Route Frequency Provider Last Rate Last Admin   acetaminophen (TYLENOL) tablet 650 mg  650 mg Oral Q6H PRN [name], NP   650 mg at 04/24/23 1553   alum & mag hydroxide-simeth (MAALOX/MYLANTA) 200-200-20 MG/5ML suspension 30 mL  30 mL Oral Q4H PRN [name], NP   30 mL at 01/28/23 1530   busPIRone (BUSPAR) tablet 15 mg  15 mg Oral BID [name], NP   15 mg at 04/25/23 1610   cyanocobalamin (VITAMIN B12) injection 1,000 mcg  1,000 mcg Intramuscular Q30 days Sarita Bottom, MD   1,000 mcg at 04/03/23 1237   diphenhydrAMINE (BENADRYL) capsule 50 mg  50 mg Oral TID PRN [name], NP   50 mg at 01/15/23 1211   Or   diphenhydrAMINE (BENADRYL) injection 50 mg  50 mg Intramuscular TID PRN [name], NP       docusate sodium (COLACE) capsule 100 mg  100 mg Oral Daily Nkwenti, Tyler Aas, NP   100 mg at 04/25/23 9604   feeding supplement (ENSURE ENLIVE / ENSURE PLUS) liquid 237 mL  237 mL Oral TID BM Nkwenti, Tyler Aas, NP   237 mL at 04/24/23 2130   haloperidol (HALDOL) tablet 5 mg  5 mg Oral TID PRN [name], NP   5 mg at 01/15/23 1211   Or   haloperidol lactate (HALDOL) injection 5 mg  5 mg Intramuscular TID PRN [name], NP       hydrOXYzine (ATARAX) tablet 25 mg  25 mg Oral TID PRN [name], DO   25 mg at 04/24/23 2120   LORazepam (ATIVAN) tablet 2 mg  2 mg Oral TID PRN [name], NP   2 mg at 01/15/23 1211   Or   LORazepam (ATIVAN) injection 2 mg  2 mg Intramuscular TID PRN [name], NP       magnesium hydroxide (MILK OF MAGNESIA) suspension 30 mL  30 mL Oral Daily PRN [name], NP   30 mL at 04/13/23 0814   melatonin  tablet 5 mg  5 mg Oral QHS Nkwenti, Doris, NP   5 mg at 04/24/23 2120   nicotine (NICODERM CQ - dosed in mg/24 hours) patch 21 mg  21 mg Transdermal Daily [name], DO   21 mg at 03/25/23 5409   nicotine polacrilex (NICORETTE) gum 2 mg  2 mg Oral PRN [name], MD   2 mg at 04/24/23 1553   paliperidone (INVEGA) 24 hr tablet 6 mg  6 mg Oral Daily [name], NP   6  mg at 04/24/23 2120   pantoprazole (PROTONIX) EC tablet 40 mg  40 mg Oral Daily [name], NP   40 mg at 04/25/23 1610   polyethylene glycol (MIRALAX / GLYCOLAX) packet 17 g  17 g Oral Daily PRN [name], NP   17 g at 04/13/23 1734   sertraline (ZOLOFT) tablet 150 mg  150 mg Oral Daily Massengill, Harrold Donath, MD   150 mg at 04/25/23 9604   SUMAtriptan (IMITREX) tablet 25 mg  25 mg Oral BID PRN [name], NP   25 mg at 04/13/23 1243   traZODone (DESYREL) tablet 50 mg  50 mg Oral QHS [name], NP   50 mg at 04/24/23 2120   Vitamin D (Ergocalciferol) (DRISDOL) 1.25 MG (50000 UNIT) capsule 50,000 Units  50,000 Units Oral Q7 days [name], NP   50,000 Units at 04/20/23 1655   Lab Results:  Results for orders placed or performed during the hospital encounter of 12/20/22 (from the past 48 hour(s))  Urinalysis, Routine w reflex microscopic -Urine, Clean Catch     Status: Abnormal   Collection Time: 04/24/23 12:19 AM  Result Value Ref Range   Color, Urine STRAW (A) YELLOW   APPearance CLEAR CLEAR   Specific Gravity, Urine 1.010 1.005 - 1.030   pH 6.0 5.0 - 8.0   Glucose, UA NEGATIVE NEGATIVE mg/dL   Hgb urine dipstick NEGATIVE NEGATIVE   Bilirubin Urine NEGATIVE NEGATIVE   Ketones, ur NEGATIVE NEGATIVE mg/dL   Protein, ur NEGATIVE NEGATIVE mg/dL   Nitrite NEGATIVE NEGATIVE   Leukocytes,Ua MODERATE (A) NEGATIVE   RBC / HPF 0-5 0 - 5 RBC/hpf   WBC, UA 11-20 0 - 5 WBC/hpf   Bacteria, UA RARE (A) NONE SEEN   Squamous Epithelial / HPF 0-5 0 - 5 /HPF   Mucus PRESENT     Comment: Performed at [facility], [address], [place], [place] [address]      Blood Alcohol level:  Lab Results  Component Value Date   [facility] <10 12/19/2022   ETH <10 11/21/2019   Metabolic Disorder Labs: Lab Results  Component Value Date   HGBA1C 4.4 (L) 12/21/2022   MPG 79.58 12/21/2022   No results found for: "PROLACTIN" Lab Results  Component Value Date   CHOL 218 (H) 12/21/2022   TRIG 81 12/21/2022   HDL 53 12/21/2022   CHOLHDL 4.1 12/21/2022   VLDL 16 12/21/2022   LDLCALC 149 (H) 12/21/2022   LDLCALC 110 (H) 03/13/2011   Physical Findings: AIMS: Facial and Oral Movements Muscles of Facial Expression: None Lips and Perioral Area: None Jaw: None Tongue: None,Extremity Movements Upper (arms, wrists, hands, fingers): None Lower (legs, knees, ankles, toes): None, Trunk Movements Neck, shoulders, hips: None, Global Judgements Severity of abnormal movements overall : None Incapacitation due to abnormal movements: None Patient's awareness of abnormal movements: No Awareness, Dental Status Current problems with teeth and/or dentures?: No Does patient usually wear dentures?: No  CIWA:    COWS:    AIMS:0 Musculoskeletal: Strength & Muscle Tone: within normal limits Gait & Station: normal Patient leans: N/A  Psychiatric Specialty Exam:  Presentation  General Appearance:  Appropriate for Environment  Eye Contact: Limited to baseline Speech: Decreased amount, decreased tone and volume but at baseline Speech Volume: Normal  Handedness: Right  Mood and Affect  Mood: Sad and depressed mainly secondary to being in the hospital with no place to go Affect: Appropriate  Thought Process  Thought Processes: Coherent  Descriptions of Associations:Intact  Orientation:Full (Time, Place and Person)  Thought Content:Logical [place]crete History of Schizophrenia/Schizoaffective disorder:Yes  Duration of Psychotic Symptoms:Greater than six  months  Hallucinations:Hallucinations: None     Ideas of Reference: None  Suicidal Thoughts:Suicidal Thoughts: No     Homicidal Thoughts:Homicidal Thoughts: No    Sensorium  Memory: Immediate Fair; Recent Fair; Remote Fair  Judgment: Fair  Insight: Fair  Art therapist  Concentration: Fair  Attention Span: Fair  Recall: [employer] of Knowledge: Fair  Language: Fair  Psychomotor Activity  Psychomotor Activity: Psychomotor Activity: Normal     Assets  Assets: Communication Skills; Desire for Improvement  Sleep  Sleep: Sleep: Good Number of Hours of Sleep: 6.5     Physical Exam: Physical Exam Vitals and nursing note reviewed.  HENT:     Nose: Nose normal.     Mouth/Throat:     Pharynx: Oropharynx is clear.  Eyes:     Pupils: Pupils are equal, round, and reactive to light.  Cardiovascular:     Rate and Rhythm: Normal rate.     Pulses: Normal pulses.  Pulmonary:     Effort: Pulmonary effort is normal.  Genitourinary:    Comments: Deferred Musculoskeletal:        General: Normal range of motion.     Cervical back: Normal range of motion.  Skin:    General: Skin is warm and dry.  Neurological:     General: No focal deficit present.     Mental Status: She is alert and oriented to person, place, and time.  Psychiatric:        Mood and Affect: Mood normal.        Behavior: Behavior normal.        Thought Content: Thought content normal.    Review of Systems  Constitutional:  Negative for chills, diaphoresis and fever.  HENT:  Negative for congestion, hearing loss and sore throat.   Eyes:  Negative for blurred vision.  Respiratory:  Negative for cough, shortness of breath and wheezing.   Cardiovascular:  Negative for chest pain and palpitations.  Gastrointestinal:  Negative for abdominal pain, constipation, diarrhea, heartburn, nausea and vomiting.  Genitourinary:  Negative for dysuria.  Musculoskeletal:  Negative for  joint pain and myalgias.  Skin:  Negative for rash.  Neurological:  Negative for dizziness.  Psychiatric/Behavioral:  Positive for depression. Negative for hallucinations, memory loss, substance abuse and suicidal ideas. The patient is nervous/anxious and has insomnia.   All other systems reviewed and are negative.  Blood pressure 92/65, pulse 75, temperature 97.8 F (36.6 C), temperature source Oral, resp. rate 18, height 4\' 11"  (1.499 m), weight 63 kg, SpO2 99%. Body mass index is 28.05 kg/m.  Treatment Plan Summary: Daily contact with patient to assess and evaluate symptoms and progress in treatment and Medication management.    Still waiting on safe disposition as patient would not be able to live independently given cognitive impairment. Recommend assisted living.    No medication side effects reported.  Because of urinary incontinence, recommended adult diapers to be worn.   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  Plan:  -Continue Imitrex 25 mg PRN BID for migraines -Continue Colace 100 mg daily for constipation -Continue Vitamin D 50.000 units weekly for bone health. -Continue Sertraline150 mg po Q daily for depression/anxiety -Continue Buspar 15 mg  po bid for anxiety.  -Continue paliperidone 6 mg po qd for mood stabilization -Continue Trazodone 50 mg nightly for insomnia  -Continue Nicoderm 21 mg topically Q 24 hrs for nicotine withdrawal management. -Continue vitamin B12 1000 mcg IM weekly for 4 weeks then monthly -Continue Melatonin 5 mg nightly for sleep -Continue Protonix EC 40 mg p.o. daily for GERD -Continue MiraLAX 17 g p.o. PRN for constipation -Continue hydroxyzine 25 mg p.o. 3 times daily as needed for anxiety -Continue  Ensure nutritional shakes TID in between meals  -Previously discontinued Hydroxyzine 50 mg nightly-hypotension in the mornings   Safety and Monitoring: Voluntary admission to inpatient psychiatric unit for safety, stabilization and treatment Daily contact with patient to assess and evaluate symptoms and progress in treatment Patient's case to be discussed in multi-disciplinary team meeting Observation Level : q15 minute checks Vital signs: q12 hours Precautions: Safety   Discharge Planning: Social work and case management to assist with discharge planning and identification of hospital follow-up needs prior to discharge Estimated LOS: Unknown at this time. Discharge Concerns: Need to establish a safety plan; Medication compliance and effectiveness Discharge Goals: Return home with outpatient referrals for mental health follow-up including medication management/psychotherapy No legal guardian, has payee  [name], MD,  04/25/2023, 9:51 AM Patient ID: Yolanda [name], female   DOB: 25-Aug-1975, 47 y.o.   MRN: [id number]  Patient ID: Yolanda [name], female   DOB: 1975/07/13, 47 y.o.   MRN: [id number] Patient ID: Yolanda [name], female   DOB: 02/25/1976, 47 y.o.   MRN: [id number] Patient ID: Yolanda [name], female   DOB: 24-May-1976, 47 y.o.   MRN: [id number]

## 2023-04-25 NOTE — Plan of Care (Signed)
Problem: Education: Goal: Utilization of techniques to improve thought processes will improve Outcome: Progressing Goal: Knowledge of the prescribed therapeutic regimen will improve Outcome: Progressing   Problem: Activity: Goal: Interest or engagement in leisure activities will improve Outcome: Progressing Goal: Imbalance in normal sleep/wake cycle will improve Outcome: Progressing

## 2023-04-25 NOTE — BHH Group Notes (Signed)
Pt did not attend goals group. 

## 2023-04-25 NOTE — BHH Group Notes (Signed)
Adult Psychoeducational Group Note  Date:  04/25/2023 Time:  9:22 PM  Group Topic/Focus:  Wrap-Up Group:   The focus of this group is to help patients review their daily goal of treatment and discuss progress on daily workbooks.  Participation Level:  Active  Participation Quality:  Appropriate  Affect:  Appropriate  Cognitive:  Appropriate  Insight: Appropriate  Engagement in Group:  Engaged  Modes of Intervention:  Discussion  Additional Comments:  Pt attended group.  Joselyn Arrow 04/25/2023, 9:22 PM

## 2023-04-25 NOTE — Group Note (Unsigned)
[facility] LCSW Group Therapy Note   Group Date: 04/25/2023 Start Time: 1100 End Time: 1145  Type of Therapy/Topic:  Group Therapy:  Feelings about Diagnosis  Participation Level:  {BHH PARTICIPATION VHQIO:96295}   Mood: ***   Description of Group:    This group will allow patients to explore their thoughts and feelings about diagnoses they have received. Patients will be guided to explore their level of understanding and acceptance of these diagnoses. Facilitator will encourage patients to process their thoughts and feelings about the reactions of others to their diagnosis, and will guide patients in identifying ways to discuss their diagnosis with significant others in their lives. This group will be process-oriented, with patients participating in exploration of their own experiences as well as giving and receiving support and challenge from other group members.   Therapeutic Goals: 1. Patient will demonstrate understanding of diagnosis as evidence by identifying two or more symptoms of the disorder:  2. Patient will be able to express two feelings regarding the diagnosis 3. Patient will demonstrate ability to communicate their needs through discussion and/or role plays  Summary of Patient Progress:    ***    Therapeutic Modalities:   Cognitive Behavioral Therapy Brief Therapy Feelings Identification    Maigen Mozingo S Brysen Shankman, LCSW

## 2023-04-26 DIAGNOSIS — F251 Schizoaffective disorder, depressive type: Secondary | ICD-10-CM | POA: Diagnosis not present

## 2023-04-26 NOTE — Progress Notes (Signed)
CSW met with Supervisor [name]), Belva Chimes and Corey Skains of [employer], via Teams to discuss progress concerning placement. Mr. Adela Glimpse of [facility] later joined the meeting to discuss details concerning the home. Mr Adela Glimpse provided details concerning the location [place], [place]) and states that the pt will be the only occupant for a while if she is approved for placement. Pt expressed excitement concerning this and indicates that she is excited to hear about the progress. Ms. Elenore Paddy indicates that she has submitted all required document to the state and is awaiting approval. CSW will continue to monitor.

## 2023-04-26 NOTE — Progress Notes (Signed)
   04/26/23 0545  15 Minute Checks  Location Bedroom  Visual Appearance Calm  Behavior Sleeping  Sleep (Behavioral Health Patients Only)  Calculate sleep? (Click Yes once per 24 hr at 0600 safety check) Yes  Documented sleep last 24 hours 8

## 2023-04-26 NOTE — Group Note (Unsigned)
Date:  04/26/2023 Time:  1:47 PM  Group Topic/Focus:  Goals Group:   The focus of this group is to help patients establish daily goals to achieve during treatment and discuss how the patient can incorporate goal setting into their daily lives to aide in recovery.     Participation Level:  {BHH PARTICIPATION IRJJO:84166}  Participation Quality:  {BHH PARTICIPATION QUALITY:22265}  Affect:  {BHH AFFECT:22266}  Cognitive:  {BHH COGNITIVE:22267}  Insight: {BHH Insight2:20797}  Engagement in Group:  {BHH ENGAGEMENT IN AYTKZ:60109}  Modes of Intervention:  {BHH MODES OF INTERVENTION:22269}  Additional Comments:  ***  [name] 04/26/2023, 1:47 PM

## 2023-04-26 NOTE — Progress Notes (Signed)
   04/26/23 2200  Psych Admission Type (Psych Patients Only)  Admission Status Involuntary  Psychosocial Assessment  Patient Complaints Anxiety;Depression;Crying spells  Eye Contact Brief  Facial Expression Flat;Sad  Affect Sad  Speech Soft  Interaction Childlike  Motor Activity Slow  Appearance/Hygiene Disheveled;Body odor  Behavior Characteristics Cooperative  Mood Pleasant  Thought Process  Coherency WDL  Content WDL  Delusions None reported or observed  Perception WDL  Hallucination None reported or observed  Judgment Poor  Confusion None  Danger to Self  Current suicidal ideation? Denies  Agreement Not to Harm Self Yes  Description of Agreement verbal  Danger to Others  Danger to Others None reported or observed

## 2023-04-26 NOTE — Group Note (Signed)
Recreation Therapy Group Note   Group Topic:Problem Solving  Group Date: 04/26/2023 Start Time: 0935 End Time: 1007 Facilitators: Jayquon Theiler-McCall, LRT,CTRS Location: [address] Dayroom   Goal Area(s) Addresses:  Patient will effectively work in a team with other group members. Patient will verbalize importance of using appropriate problem solving techniques.  Patient will identify positive change associated with effective problem solving skills.   Group Description: Brain Teasers. Patients were given two sheets of brain teasers. Patients worked together to Radiation protection practitioner.     Education Outcome: Acknowledges understanding/In group clarification offered/Needs additional education.    Affect/Mood: N/A   Participation Level: Did not attend    Clinical Observations/Individualized Feedback:     Plan: Continue to engage patient in RT group sessions 2-3x/week.   Sherrilyn Nairn-McCall, LRT,CTRS  04/26/2023 11:44 AM

## 2023-04-26 NOTE — Progress Notes (Signed)
[facility] MD Progress Note  04/26/2023 3:30 PM Yolanda [name]  MRN:  [id number]  Principal Problem: Schizoaffective disorder, depressive type (HCC)  Diagnosis: Principal Problem:   Schizoaffective disorder, depressive type (HCC) Active Problems:   GERD (gastroesophageal reflux disease)   Intellectual disability   Tobacco use disorder  Reason for admission: This is the first psychiatric admission in this [facility] in 12 years for this 47 AA female with an extensive hx of mental illnesses & probable polysubstance use disorders. She is admitted to the [facility] from the [facility] hospital with complain of worsening suicidal ideations with plan to stab herself. Per chart review, patient apparently reported at the ED that she has been depressed for a while & has not been taking her mental health medications. After medical evaluation.clearance, she was transferred to the [facility] for further psychiatric evaluation/treatments.   24 hour chart review: BP on the lower side today at 91/67, and HR slightly low at 56. Fluids being encouraged. Pt remains asymptomatic. with exception of heart rate which was slightly low at 56 earlier today morning.  Patient continues to stay isolative to her room earlier in the day, but is visible in the day room and around the unit as the afternoon approaches.  Continuing to require positive reinforcements to take care of personal hygiene needs.  No behavioral episodes in the past 24 hours, did not require any PRNs for agitation or anything else.  Patient assessment note: Mood was depressed earlier today morning, but pt was visible at the nurses' station after lunch interacting with peers and staff. Patient continues to deny SI/HI/AVH, denies paranoia, and there is no evidence of delusional thinking.  Reports a good sleep quality last night, but was tired earlier today morning, requiring multiple positive reinforcements, to open up her eyes and continue to engage in assessment.  Patient has  been educated on the need to engage in group sessions, while waiting for placement, and also on the need to take care of personal hygiene, and has verbalized understanding.  She denies being in any physical pain today, denies any issues with her bowel movements, states the last one was last night.  All medications being continued as listed below. [name] symptoms remain stable for management outside of the hospital setting, but we continue to await placement by DSS. [name] cognitive limitations negatively impact upon patient's ability to function independently in a shelter type environment. no family members or friends are available to house her. We will continue to follow.   Total Time spent with patient:  25 minutes  Past Psychiatric History:  See H & P  Past Medical History:  Past Medical History:  Diagnosis Date   Bipolar affect, depressed (HCC)    Constipation 08/17/2022   Depression    Falls 07/21/2022   Fracture of femoral neck, right, closed (HCC) 01/17/2022   Herpes simplex 08/22/2017   Open fracture dislocation of right elbow joint 01/17/2022    Past Surgical History:  Procedure Laterality Date   NO PAST SURGERIES     SALPINGECTOMY     Family History: History reviewed. No pertinent family history.  Family Psychiatric  History: See H & P  Social History:  Social History   Substance and Sexual Activity  Alcohol Use Yes     Social History   Substance and Sexual Activity  Drug Use Yes   Types: Cocaine, Marijuana    Social History   Socioeconomic History   Marital status: Single    Spouse name: Not  on file   Number of children: Not on file   Years of education: Not on file   Highest education level: Not on file  Occupational History   Not on file  Tobacco Use   Smoking status: Every Day   Smokeless tobacco: Not on file  Substance and Sexual Activity   Alcohol use: Yes   Drug use: Yes    Types: Cocaine, Marijuana   Sexual activity: Yes  Other Topics Concern    Not on file  Social History Narrative   Not on file   Social Determinants of Health   Financial Resource Strain: Low Risk  (09/12/2022)   Received from [facility]   Overall Financial Resource Strain (CARDIA)    Difficulty of Paying Living Expenses: Not hard at all  Food Insecurity: Patient Declined (12/20/2022)   Hunger Vital Sign    Worried About Running Out of Food in the Last Year: Patient declined    Ran Out of Food in the Last Year: Patient declined  Transportation Needs: No Transportation Needs (12/20/2022)   PRAPARE - Administrator, Civil Service (Medical): No    Lack of Transportation (Non-Medical): No  Physical Activity: Not on file  Stress: No Stress Concern Present (07/17/2022)   Received from [facility] of Occupational Health - Occupational Stress Questionnaire    Feeling of Stress : Not at all  Social Connections: Unknown (07/16/2022)   Received from [facility]   Social Network    Social Network: Not on file   Sleep: Good  Appetite:  Good  Current Medications: Current Facility-Administered Medications  Medication Dose Route Frequency Provider Last Rate Last Admin   acetaminophen (TYLENOL) tablet 650 mg  650 mg Oral Q6H PRN [name], NP   650 mg at 04/24/23 1553   alum & mag hydroxide-simeth (MAALOX/MYLANTA) 200-200-20 MG/5ML suspension 30 mL  30 mL Oral Q4H PRN [name], NP   30 mL at 01/28/23 1530   busPIRone (BUSPAR) tablet 15 mg  15 mg Oral BID [name], NP   15 mg at 04/26/23 0758   cyanocobalamin (VITAMIN B12) injection 1,000 mcg  1,000 mcg Intramuscular Q30 days Sarita Bottom, MD   1,000 mcg at 04/03/23 1237   diphenhydrAMINE (BENADRYL) capsule 50 mg  50 mg Oral TID PRN [name], NP   50 mg at 01/15/23 1211   Or   diphenhydrAMINE (BENADRYL) injection 50 mg  50 mg Intramuscular TID PRN [name], NP       docusate sodium (COLACE) capsule 100 mg  100 mg  Oral Daily Blaise Grieshaber, Tyler Aas, NP   100 mg at 04/26/23 0758   feeding supplement (ENSURE ENLIVE / ENSURE PLUS) liquid 237 mL  237 mL Oral TID BM Ilma Achee, NP   237 mL at 04/26/23 1344   haloperidol (HALDOL) tablet 5 mg  5 mg Oral TID PRN [name], NP   5 mg at 01/15/23 1211   Or   haloperidol lactate (HALDOL) injection 5 mg  5 mg Intramuscular TID PRN [name], NP       hydrOXYzine (ATARAX) tablet 25 mg  25 mg Oral TID PRN [name], DO   25 mg at 04/25/23 2118   LORazepam (ATIVAN) tablet 2 mg  2 mg Oral TID PRN [name], NP   2 mg at 01/15/23 1211   Or   LORazepam (ATIVAN) injection 2 mg  2 mg Intramuscular  TID PRN [name], NP       magnesium hydroxide (MILK OF MAGNESIA) suspension 30 mL  30 mL Oral Daily PRN [name], NP   30 mL at 04/13/23 0814   melatonin tablet 5 mg  5 mg Oral QHS Lorelai Huyser, NP   5 mg at 04/25/23 2117   nicotine (NICODERM CQ - dosed in mg/24 hours) patch 21 mg  21 mg Transdermal Daily [name], DO   21 mg at 03/25/23 7829   nicotine polacrilex (NICORETTE) gum 2 mg  2 mg Oral PRN [name], MD   2 mg at 04/24/23 1553   paliperidone (INVEGA) 24 hr tablet 6 mg  6 mg Oral Daily [name], NP   6 mg at 04/25/23 2118   pantoprazole (PROTONIX) EC tablet 40 mg  40 mg Oral Daily [name], NP   40 mg at 04/26/23 0758   polyethylene glycol (MIRALAX / GLYCOLAX) packet 17 g  17 g Oral Daily PRN [name], NP   17 g at 04/13/23 1734   sertraline (ZOLOFT) tablet 150 mg  150 mg Oral Daily Massengill, Harrold Donath, MD   150 mg at 04/26/23 0757   SUMAtriptan (IMITREX) tablet 25 mg  25 mg Oral BID PRN [name], NP   25 mg at 04/25/23 2032   traZODone (DESYREL) tablet 50 mg  50 mg Oral QHS [name], NP   50 mg at 04/25/23 2118   Vitamin D (Ergocalciferol) (DRISDOL) 1.25 MG (50000 UNIT) capsule 50,000 Units  50,000 Units Oral Q7 days [name], NP   50,000 Units at 04/20/23 1655   Lab Results:  No results found for  this or any previous visit (from the past 48 hour(s)).     Blood Alcohol level:  Lab Results  Component Value Date   ETH <10 12/19/2022   ETH <10 11/21/2019   Metabolic Disorder Labs: Lab Results  Component Value Date   HGBA1C 4.4 (L) 12/21/2022   MPG 79.58 12/21/2022   No results found for: "PROLACTIN" Lab Results  Component Value Date   CHOL 218 (H) 12/21/2022   TRIG 81 12/21/2022   HDL 53 12/21/2022   CHOLHDL 4.1 12/21/2022   VLDL 16 12/21/2022   LDLCALC 149 (H) 12/21/2022   LDLCALC 110 (H) 03/13/2011   Physical Findings: AIMS: Facial and Oral Movements Muscles of Facial Expression: None Lips and Perioral Area: None Jaw: None Tongue: None,Extremity Movements Upper (arms, wrists, hands, fingers): None Lower (legs, knees, ankles, toes): None, Trunk Movements Neck, shoulders, hips: None, Global Judgements Severity of abnormal movements overall : None Incapacitation due to abnormal movements: None Patient's awareness of abnormal movements: No Awareness, Dental Status Current problems with teeth and/or dentures?: No Does patient usually wear dentures?: No  CIWA:    COWS:    AIMS:0 Musculoskeletal: Strength & Muscle Tone: within normal limits Gait & Station: normal Patient leans: N/A  Psychiatric Specialty Exam:  Presentation  General Appearance:  Disheveled  Eye Contact: Limited to baseline Speech: Decreased amount, decreased tone and volume but at baseline Speech Volume: Normal  Handedness: Right  Mood and Affect  Mood: Sad and depressed mainly secondary to being in the hospital with no place to go Affect: Congruent  Thought Process  Thought Processes: Coherent  Descriptions of Associations:Intact  Orientation:Partial  Thought Content:Logical Concrete History of Schizophrenia/Schizoaffective disorder:Yes  Duration of Psychotic Symptoms:Greater than six months  Hallucinations:Hallucinations: None     Ideas of Reference:  None  Suicidal Thoughts:Suicidal Thoughts: No  Homicidal Thoughts:Homicidal Thoughts: No    Sensorium  Memory: Immediate Good  Judgment: Fair  Insight: Fair  Chartered certified accountant: Fair  Attention Span: Fair  Recall: Poor  Fund of Knowledge: Poor  Language: Fair  Lexicographer Activity: Psychomotor Activity: Normal     Assets  Assets: Resilience  Sleep  Sleep: Sleep: Good Number of Hours of Sleep: 6.5     Physical Exam: Physical Exam Vitals and nursing note reviewed.  HENT:     Nose: Nose normal.     Mouth/Throat:     Pharynx: Oropharynx is clear.  Eyes:     Pupils: Pupils are equal, round, and reactive to light.  Cardiovascular:     Rate and Rhythm: Normal rate.     Pulses: Normal pulses.  Pulmonary:     Effort: Pulmonary effort is normal.  Genitourinary:    Comments: Deferred Musculoskeletal:        General: Normal range of motion.     Cervical back: Normal range of motion.  Skin:    General: Skin is warm and dry.  Neurological:     General: No focal deficit present.     Mental Status: She is alert and oriented to person, place, and time.  Psychiatric:        Mood and Affect: Mood normal.        Behavior: Behavior normal.        Thought Content: Thought content normal.    Review of Systems  Constitutional:  Negative for chills, diaphoresis and fever.  HENT:  Negative for congestion, hearing loss and sore throat.   Eyes:  Negative for blurred vision.  Respiratory:  Negative for cough, shortness of breath and wheezing.   Cardiovascular:  Negative for chest pain and palpitations.  Gastrointestinal:  Negative for abdominal pain, constipation, diarrhea, heartburn, nausea and vomiting.  Genitourinary:  Negative for dysuria.  Musculoskeletal:  Negative for joint pain and myalgias.  Skin:  Negative for rash.  Neurological:  Negative for dizziness.  Psychiatric/Behavioral:  Positive for  depression. Negative for hallucinations, memory loss, substance abuse and suicidal ideas. The patient is nervous/anxious and has insomnia.   All other systems reviewed and are negative.  Blood pressure 91/67, pulse (!) 56, temperature 97.8 F (36.6 C), temperature source Oral, resp. rate 18, height 4\' 11"  (1.499 m), weight 63 kg, SpO2 99%. Body mass index is 28.05 kg/m.  Treatment Plan Summary: Daily contact with patient to assess and evaluate symptoms and progress in treatment and Medication management.    Still waiting on safe disposition as patient would not be able to live independently given cognitive impairment. Recommend assisted living.    No medication side effects reported.  Because of urinary incontinence, recommended adult diapers to be worn.   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  Plan:  -Continue Imitrex 25 mg PRN BID for migraines -Continue Colace 100 mg daily for constipation -Continue Vitamin D 50.000 units weekly for bone health. -Continue Sertraline150 mg po Q daily for depression/anxiety -Continue Buspar 15 mg po bid for anxiety.  -Continue paliperidone 6 mg po qd for mood stabilization -Continue Trazodone 50 mg nightly for insomnia  -Continue Nicoderm 21 mg topically Q 24 hrs for nicotine withdrawal management. -Continue vitamin B12 1000 mcg IM weekly for 4 weeks then monthly -Continue Melatonin 5 mg nightly for sleep -  Continue Protonix EC 40 mg p.o. daily for GERD -Continue MiraLAX 17 g p.o. PRN for constipation -Continue hydroxyzine 25 mg p.o. 3 times daily as needed for anxiety -Continue Ensure nutritional shakes TID in between meals  -Previously discontinued Hydroxyzine 50 mg nightly-hypotension in the mornings    Safety and Monitoring: Voluntary admission to inpatient psychiatric unit for safety, stabilization and treatment Daily contact with patient to assess and evaluate symptoms and progress in treatment Patient's case to be discussed in multi-disciplinary team meeting Observation Level : q15 minute checks Vital signs: q12 hours Precautions: Safety   Discharge Planning: Social work and case management to assist with discharge planning and identification of hospital follow-up needs prior to discharge Estimated LOS: Unknown at this time. Discharge Concerns: Need to establish a safety plan; Medication compliance and effectiveness Discharge Goals: Return home with outpatient referrals for mental health follow-up including medication management/psychotherapy No legal guardian, has payee  [name], NP,  04/26/2023, 3:30 PM Patient ID: Yolanda [name], female   DOB: [birth year], 47 y.o.   MRN: [id number]

## 2023-04-26 NOTE — Progress Notes (Signed)
   04/25/23 2120  Psych Admission Type (Psych Patients Only)  Admission Status Involuntary  Psychosocial Assessment  Patient Complaints Anxiety;Depression  Eye Contact Brief  Facial Expression Sad  Affect Sad  Speech Soft  Interaction Childlike  Motor Activity Slow  Appearance/Hygiene Disheveled  Behavior Characteristics Cooperative  Mood Pleasant  Thought Process  Coherency WDL  Content WDL  Delusions None reported or observed  Perception WDL  Hallucination None reported or observed  Judgment Poor  Confusion None  Danger to Self  Current suicidal ideation? Denies  Agreement Not to Harm Self Yes  Description of Agreement verbal  Danger to Others  Danger to Others None reported or observed

## 2023-04-26 NOTE — Plan of Care (Signed)
  Problem: Education: Goal: Knowledge of the prescribed therapeutic regimen will improve Outcome: Progressing   Problem: Activity: Goal: Interest or engagement in leisure activities will improve Outcome: Progressing Goal: Imbalance in normal sleep/wake cycle will improve Outcome: Progressing   Problem: Coping: Goal: Coping ability will improve Outcome: Progressing Goal: Will verbalize feelings Outcome: Progressing   Problem: Health Behavior/Discharge Planning: Goal: Ability to make decisions will improve Outcome: Progressing Goal: Compliance with therapeutic regimen will improve Outcome: Progressing

## 2023-04-26 NOTE — Plan of Care (Signed)
  Problem: Education: Goal: Utilization of techniques to improve thought processes will improve Outcome: Progressing   Problem: Education: Goal: Knowledge of the prescribed therapeutic regimen will improve Outcome: Progressing   Problem: Coping: Goal: Coping ability will improve Outcome: Progressing   Problem: Safety: Goal: Ability to disclose and discuss suicidal ideas will improve Outcome: Progressing   Problem: Health Behavior/Discharge Planning: Goal: Ability to make decisions will improve Outcome: Progressing

## 2023-04-26 NOTE — Progress Notes (Signed)
   04/26/23 1000  Psych Admission Type (Psych Patients Only)  Admission Status Involuntary  Psychosocial Assessment  Patient Complaints Anxiety;Depression  Eye Contact Brief  Facial Expression Flat;Sad  Affect Sad  Speech Soft  Interaction Childlike  Motor Activity Slow  Appearance/Hygiene Disheveled  Behavior Characteristics Cooperative  Mood Pleasant  Thought Process  Coherency WDL  Content WDL  Delusions None reported or observed  Perception WDL  Hallucination None reported or observed  Judgment Poor  Confusion None  Danger to Self  Current suicidal ideation? Denies  Agreement Not to Harm Self Yes  Description of Agreement verbal  Danger to Others  Danger to Others None reported or observed

## 2023-04-26 NOTE — [facility] Group Notes (Signed)
Pt did not attend AA group  

## 2023-04-27 NOTE — Progress Notes (Signed)
   04/27/23 0645  15 Minute Checks  Location Bedroom  Visual Appearance Calm  Behavior Composed  Sleep (Behavioral Health Patients Only)  Calculate sleep? (Click Yes once per 24 hr at 0600 safety check) Yes  Documented sleep last 24 hours 7.75

## 2023-04-27 NOTE — Progress Notes (Signed)
[facility] MD Progress Note  04/27/2023 4:13 PM Yolanda [name]  MRN:  [id number]  Principal Problem: Schizoaffective disorder, depressive type (HCC)  Diagnosis: Principal Problem:   Schizoaffective disorder, depressive type (HCC) Active Problems:   GERD (gastroesophageal reflux disease)   Intellectual disability   Tobacco use disorder  Reason for admission: This is the first psychiatric admission in this [facility] in 12 years for this 47 AA female with an extensive hx of mental illnesses & probable polysubstance use disorders. She is admitted to the [facility] from the [facility] hospital with complain of worsening suicidal ideations with plan to stab herself. Per chart review, patient apparently reported at the ED that she has been depressed for a while & has not been taking her mental health medications. After medical evaluation.clearance, she was transferred to the [facility] for further psychiatric evaluation/treatments.   24 hour chart review: BP 101/73 and HR 55. Patient endorsing some pain on her side, and reports a BM yesterday. No PRNs needed for behaviors. PRNs taken include: nicotine gum in the evening and atarax 25mg  before bed.   Patient assessment note: Patient reports that she did not get up for breakfast this AM because her side was hurting, but reports that she will probably get up to eat later today. Patient reports she will try to dirnk more as well. Patient reports that her mood is "ok" and she slept "ok" last night. Patient denies SI, HI,and AVH. Provider reminded patient to try an attend groups and that laying on her side may be contributing to the new pain. Patient endorses she will attempt to get up and walk.   Patient reports that her urine is yellow, when asked by provider given her side pain.   Total Time spent with patient:  25 minutes  Past Psychiatric History:  See H & P  Past Medical History:  Past Medical History:  Diagnosis Date   Bipolar affect, depressed (HCC)     Constipation 08/17/2022   Depression    Falls 07/21/2022   Fracture of femoral neck, right, closed (HCC) 01/17/2022   Herpes simplex 08/22/2017   Open fracture dislocation of right elbow joint 01/17/2022    Past Surgical History:  Procedure Laterality Date   NO PAST SURGERIES     SALPINGECTOMY     Family History: History reviewed. No pertinent family history.  Family Psychiatric  History: See H & P  Social History:  Social History   Substance and Sexual Activity  Alcohol Use Yes     Social History   Substance and Sexual Activity  Drug Use Yes   Types: Cocaine, Marijuana    Social History   Socioeconomic History   Marital status: Single    Spouse name: Not on file   Number of children: Not on file   Years of education: Not on file   Highest education level: Not on file  Occupational History   Not on file  Tobacco Use   Smoking status: Every Day   Smokeless tobacco: Not on file  Substance and Sexual Activity   Alcohol use: Yes   Drug use: Yes    Types: Cocaine, Marijuana   Sexual activity: Yes  Other Topics Concern   Not on file  Social History Narrative   Not on file   Social Determinants of Health   Financial Resource Strain: Low Risk  (09/12/2022)   Received from [facility], [facility]   Overall Financial Resource Strain (CARDIA)    Difficulty of Paying Living  Expenses: Not hard at all  Food Insecurity: Patient Declined (12/20/2022)   Hunger Vital Sign    Worried About Running Out of Food in the Last Year: Patient declined    Ran Out of Food in the Last Year: Patient declined  Transportation Needs: No Transportation Needs (12/20/2022)   PRAPARE - Administrator, Civil Service (Medical): No    Lack of Transportation (Non-Medical): No  Physical Activity: Not on file  Stress: No Stress Concern Present (07/17/2022)   Received from [facility], [facility] of Occupational Health - Occupational Stress Questionnaire     Feeling of Stress : Not at all  Social Connections: Unknown (07/16/2022)   Received from [facility], [facility]   Social Network    Social Network: Not on file   Sleep: Good  Appetite:  Good  Current Medications: Current Facility-Administered Medications  Medication Dose Route Frequency Provider Last Rate Last Admin   acetaminophen (TYLENOL) tablet 650 mg  650 mg Oral Q6H PRN [name], NP   650 mg at 04/24/23 1553   alum & mag hydroxide-simeth (MAALOX/MYLANTA) 200-200-20 MG/5ML suspension 30 mL  30 mL Oral Q4H PRN [name], NP   30 mL at 01/28/23 1530   busPIRone (BUSPAR) tablet 15 mg  15 mg Oral BID [name], NP   15 mg at 04/27/23 1612   cyanocobalamin (VITAMIN B12) injection 1,000 mcg  1,000 mcg Intramuscular Q30 days [name], Nadir, MD   1,000 mcg at 04/03/23 1237   diphenhydrAMINE (BENADRYL) capsule 50 mg  50 mg Oral TID PRN [name], NP   50 mg at 01/15/23 1211   Or   diphenhydrAMINE (BENADRYL) injection 50 mg  50 mg Intramuscular TID PRN [name], NP       docusate sodium (COLACE) capsule 100 mg  100 mg Oral Daily Nkwenti, Tyler Aas, NP   100 mg at 04/27/23 1610   feeding supplement (ENSURE ENLIVE / ENSURE PLUS) liquid 237 mL  237 mL Oral TID BM Nkwenti, Tyler Aas, NP   237 mL at 04/26/23 2127   haloperidol (HALDOL) tablet 5 mg  5 mg Oral TID PRN [name], NP   5 mg at 01/15/23 1211   Or   haloperidol lactate (HALDOL) injection 5 mg  5 mg Intramuscular TID PRN [name], NP       hydrOXYzine (ATARAX) tablet 25 mg  25 mg Oral TID PRN [name], DO   25 mg at 04/26/23 2128   LORazepam (ATIVAN) tablet 2 mg  2 mg Oral TID PRN [name], NP   2 mg at 01/15/23 1211   Or   LORazepam (ATIVAN) injection 2 mg  2 mg Intramuscular TID PRN [name], NP       magnesium hydroxide (MILK OF MAGNESIA) suspension 30 mL  30 mL Oral Daily PRN [name], NP   30 mL at 04/13/23 0814   melatonin tablet 5 mg  5 mg Oral QHS Nkwenti, Doris, NP   5 mg at  04/26/23 2128   nicotine (NICODERM CQ - dosed in mg/24 hours) patch 21 mg  21 mg Transdermal Daily [name], DO   21 mg at 03/25/23 9604   nicotine polacrilex (NICORETTE) gum 2 mg  2 mg Oral PRN [name], MD   2 mg at 04/27/23 1419   paliperidone (INVEGA) 24 hr tablet 6 mg  6 mg Oral Daily [name], NP   6 mg at 04/26/23 2127  pantoprazole (PROTONIX) EC tablet 40 mg  40 mg Oral Daily Nwoko, Nicole Kindred I, NP   40 mg at 04/27/23 1610   polyethylene glycol (MIRALAX / GLYCOLAX) packet 17 g  17 g Oral Daily PRN [name], NP   17 g at 04/13/23 1734   sertraline (ZOLOFT) tablet 150 mg  150 mg Oral Daily Massengill, Harrold Donath, MD   150 mg at 04/27/23 9604   SUMAtriptan (IMITREX) tablet 25 mg  25 mg Oral BID PRN [name], NP   25 mg at 04/25/23 2032   traZODone (DESYREL) tablet 50 mg  50 mg Oral QHS [name], NP   50 mg at 04/26/23 2128   Vitamin D (Ergocalciferol) (DRISDOL) 1.25 MG (50000 UNIT) capsule 50,000 Units  50,000 Units Oral Q7 days [name], NP   50,000 Units at 04/27/23 1612   Lab Results:  No results found for this or any previous visit (from the past 48 hour(s)).     Blood Alcohol level:  Lab Results  Component Value Date   ETH <10 12/19/2022   ETH <10 11/21/2019   Metabolic Disorder Labs: Lab Results  Component Value Date   HGBA1C 4.4 (L) 12/21/2022   MPG 79.58 12/21/2022   No results found for: "PROLACTIN" Lab Results  Component Value Date   CHOL 218 (H) 12/21/2022   TRIG 81 12/21/2022   HDL 53 12/21/2022   CHOLHDL 4.1 12/21/2022   VLDL 16 12/21/2022   LDLCALC 149 (H) 12/21/2022   LDLCALC 110 (H) 03/13/2011   Physical Findings: AIMS: Facial and Oral Movements Muscles of Facial Expression: None Lips and Perioral Area: None Jaw: None Tongue: None,Extremity Movements Upper (arms, wrists, hands, fingers): None Lower (legs, knees, ankles, toes): None, Trunk Movements Neck, shoulders, hips: None, Global Judgements Severity of  abnormal movements overall : None Incapacitation due to abnormal movements: None Patient's awareness of abnormal movements: No Awareness, Dental Status Current problems with teeth and/or dentures?: No Does patient usually wear dentures?: No  CIWA:    COWS:    AIMS:0 Musculoskeletal: Strength & Muscle Tone: within normal limits Gait & Station: normal Patient leans: N/A  Psychiatric Specialty Exam:  Presentation  General Appearance:  Appropriate for Environment  Eye Contact: Limited to baseline Speech: Decreased amount, decreased tone and volume but at baseline Speech Volume: Decreased  Handedness: Right  Mood and Affect  Mood: Sad and depressed mainly secondary to being in the hospital with no place to go Affect: Appropriate  Thought Process  Thought Processes: Linear  Descriptions of Associations:Intact  Orientation:Partial  Thought Content:Logical Concrete History of Schizophrenia/Schizoaffective disorder:Yes  Duration of Psychotic Symptoms:Greater than six months  Hallucinations:Hallucinations: None     Ideas of Reference: None  Suicidal Thoughts:Suicidal Thoughts: No     Homicidal Thoughts:Homicidal Thoughts: No    Sensorium  Memory: Immediate Good  Judgment: Fair  Insight: Shallow  Executive Functions  Concentration: Fair  Attention Span: Fair  Recall: Poor  Fund of Knowledge: Poor  Language: Fair  Psychomotor Activity  Psychomotor Activity: Psychomotor Activity: Decreased     Assets  Assets: Resilience  Sleep  Sleep: Sleep: Good     Physical Exam: Physical Exam Vitals and nursing note reviewed.  Cardiovascular:     Rate and Rhythm: Normal rate.  Pulmonary:     Effort: Pulmonary effort is normal.  Genitourinary:    Comments: Deferred Musculoskeletal:        General: Normal range of motion.     Cervical back: Normal range of motion.  Skin:    General: Skin is warm and dry.  Neurological:      General: No focal deficit present.     Mental Status: She is alert and oriented to person, place, and time.  Psychiatric:        Mood and Affect: Mood normal.        Behavior: Behavior normal.        Thought Content: Thought content normal.    Review of Systems  Constitutional:  Negative for fever.  Respiratory:  Negative for cough, shortness of breath and wheezing.   Gastrointestinal:  Positive for abdominal pain and vomiting. Negative for constipation.  Genitourinary:        Pain to light touch on the R side  Neurological:  Negative for dizziness.  Psychiatric/Behavioral:  Negative for depression, hallucinations, memory loss, substance abuse and suicidal ideas. The patient is nervous/anxious and has insomnia.   All other systems reviewed and are negative.  Blood pressure 101/73, pulse (!) 55, temperature 97.6 F (36.4 C), temperature source Oral, resp. rate 18, height 4\' 11"  (1.499 m), weight 63 kg, SpO2 99%. Body mass index is 28.05 kg/m.  Treatment Plan Summary: Daily contact with patient to assess and evaluate symptoms and progress in treatment and Medication management.   Will continue to monitor side pain, patient is reporting patient was sensitive to light touch on the area. At this time it may be 2/2 to patient constantly lying on that side. Patient not having urinary concerns currently.    Still waiting on safe disposition as patient would not be able to live independently given cognitive impairment. Recommend assisted living.    No medication side effects reported.  Because of urinary incontinence, recommended adult diapers to be worn.   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  Plan:  -Continue Imitrex 25 mg PRN BID for  migraines -Continue Colace 100 mg daily for constipation -Continue Vitamin D 50.000 units weekly for bone health. -Continue Sertraline150 mg po Q daily for depression/anxiety -Continue Buspar 15 mg po bid for anxiety.  -Continue paliperidone 6 mg po qd for mood stabilization -Continue Trazodone 50 mg nightly for insomnia  -Continue Nicoderm 21 mg topically Q 24 hrs for nicotine withdrawal management. -Continue vitamin B12 1000 mcg IM weekly for 4 weeks then monthly -Continue Melatonin 5 mg nightly for sleep -Continue Protonix EC 40 mg p.o. daily for GERD -Continue MiraLAX 17 g p.o. PRN for constipation -Continue hydroxyzine 25 mg p.o. 3 times daily as needed for anxiety -Continue Ensure nutritional shakes TID in between meals  -Previously discontinued Hydroxyzine 50 mg nightly-hypotension in the mornings   Safety and Monitoring: Voluntary admission to inpatient psychiatric unit for safety, stabilization and treatment Daily contact with patient to assess and evaluate symptoms and progress in treatment Patient's case to be discussed in multi-disciplinary team meeting Observation Level : q15 minute checks Vital signs: q12 hours Precautions: Safety   Discharge Planning: Social work and case management to assist with discharge planning and identification of hospital follow-up needs prior to discharge Estimated LOS: Unknown at this time. Discharge Concerns: Need to establish a safety plan; Medication compliance and effectiveness Discharge Goals: Return home with outpatient referrals for mental health follow-up including medication management/psychotherapy No legal guardian, has payee  Bobbye Morton, MD,  04/27/2023, 4:13 PM Patient ID: Yolanda [name], female   DOB:  1975/11/03, 47 y.o.   MRN: [id number]

## 2023-04-27 NOTE — Plan of Care (Signed)

## 2023-04-27 NOTE — Group Note (Signed)
Date:  04/27/2023 Time:  2:45 PM  Group Topic/Focus:  Goals Group:   The focus of this group is to help patients establish daily goals to achieve during treatment and discuss how the patient can incorporate goal setting into their daily lives to aide in recovery.    Participation Level:  Minimal  Participation Quality:  Attentive  Affect:  Appropriate  Cognitive:  Appropriate  Insight: Good  Engagement in Group:  Lacking  Modes of Intervention:  Discussion  Additional Comments:     [name] 04/27/2023, 2:45 PM

## 2023-04-27 NOTE — Group Note (Unsigned)
Date:  04/27/2023 Time:  2:35 PM  Group Topic/Focus:  Goals Group:   The focus of this group is to help patients establish daily goals to achieve during treatment and discuss how the patient can incorporate goal setting into their daily lives to aide in recovery.     Participation Level:  {BHH PARTICIPATION XLKGM:01027}  Participation Quality:  {BHH PARTICIPATION QUALITY:22265}  Affect:  {BHH AFFECT:22266}  Cognitive:  {BHH COGNITIVE:22267}  Insight: {BHH Insight2:20797}  Engagement in Group:  {BHH ENGAGEMENT IN OZDGU:44034}  Modes of Intervention:  {BHH MODES OF INTERVENTION:22269}  Additional Comments:  ***  [name] 04/27/2023, 2:35 PM

## 2023-04-27 NOTE — Progress Notes (Signed)
Patient ID: Yolanda [name], female   DOB: August 20, 1975, 47 y.o.   MRN: [id number] Keilah approached in room. She remains tearful at times on the unit and with multiple somatic complaints. She states '' I just feel blah. '' She later reported back pain and then side pain , encouraged pt to get up and mobilize on the unit to assist with pain as she is observed to frequently lay in the bed during day time hours. Pt given gingerale and gatorade and encouraged hydration. Pt is able to make her needs known. Treatment team reports pt awaiting her placement to be completed but has been accepted in [place].  Will con't to monitor. Pt refused self inventory. Pt is safe, will con't to monitor.

## 2023-04-27 NOTE — Plan of Care (Signed)
  Problem: Education: Goal: Utilization of techniques to improve thought processes will improve Outcome: Progressing   Problem: Education: Goal: Knowledge of the prescribed therapeutic regimen will improve Outcome: Progressing   Problem: Coping: Goal: Coping ability will improve Outcome: Progressing   Problem: Role Relationship: Goal: Will demonstrate positive changes in social behaviors and relationships Outcome: Progressing   Problem: Safety: Goal: Ability to disclose and discuss suicidal ideas will improve Outcome: Progressing

## 2023-04-27 NOTE — BHH Group Notes (Signed)
BHH Group Notes:  (Nursing/MHT/Case Management/Adjunct)  Date:  04/27/2023  Time:  9:42 PM  Type of Therapy:   Wrap-up group  Participation Level:  Active  Participation Quality:  Appropriate  Affect:  Appropriate  Cognitive:  Appropriate  Insight:  Appropriate  Engagement in Group:  Engaged  Modes of Intervention:  Education  Summary of Progress/Problems: Pt goal to leave. Rated her day 7/10.   Noah Delaine 04/27/2023, 9:42 PM

## 2023-04-27 NOTE — BHH Group Notes (Signed)
B[facility] Group Notes:  (Nursing/MHT/Case Management/Adjunct)  Date:  04/27/2023  Time:  2:25 PM  Type of Therapy:  Psychoeducational Skills  Participation Level:  Active  Participation Quality:  Appropriate  Affect:  Appropriate  Cognitive:  Alert and Appropriate  Insight:  Appropriate  Engagement in Group:  Engaged  Modes of Intervention:  Discussion, Education, and Exploration  Summary of Progress/Problems:  Patients were educated on positive reframing, and the impacts it can have on mental well being. Pts were then given a podcast to listen to by '' [employer] with Berniece Pap '' to identify healthy behaviors and habits to impact mental health. Pt attended and was appropriate.   Malva Limes 04/27/2023, 2:25 PM

## 2023-04-28 DIAGNOSIS — F251 Schizoaffective disorder, depressive type: Secondary | ICD-10-CM | POA: Diagnosis not present

## 2023-04-28 LAB — URINALYSIS, W/ REFLEX TO CULTURE (INFECTION SUSPECTED)
Bilirubin Urine: NEGATIVE
Glucose, UA: NEGATIVE mg/dL
Hgb urine dipstick: NEGATIVE
Ketones, ur: NEGATIVE mg/dL
Nitrite: NEGATIVE
Protein, ur: NEGATIVE mg/dL
Specific Gravity, Urine: 1.017 (ref 1.005–1.030)
pH: 5 (ref 5.0–8.0)

## 2023-04-28 NOTE — Progress Notes (Signed)
   04/28/23 0000  Psych Admission Type (Psych Patients Only)  Admission Status Involuntary  Psychosocial Assessment  Patient Complaints Depression  Eye Contact Fair  Facial Expression Flat  Affect Sad  Speech Soft  Interaction Childlike  Motor Activity Slow  Appearance/Hygiene Improved  Behavior Characteristics Cooperative;Appropriate to situation  Mood Depressed  Thought Process  Coherency WDL  Content WDL  Delusions None reported or observed  Perception WDL  Hallucination None reported or observed  Judgment Poor  Confusion None  Danger to Self  Current suicidal ideation? Denies  Agreement Not to Harm Self Yes  Description of Agreement verbal  Danger to Others  Danger to Others None reported or observed

## 2023-04-28 NOTE — Plan of Care (Signed)
  Problem: Education: Goal: Utilization of techniques to improve thought processes will improve Outcome: Progressing   Problem: Activity: Goal: Interest or engagement in leisure activities will improve Outcome: Progressing   Problem: Coping: Goal: Coping ability will improve Outcome: Progressing   Problem: Role Relationship: Goal: Will demonstrate positive changes in social behaviors and relationships Outcome: Progressing   Problem: Safety: Goal: Ability to disclose and discuss suicidal ideas will improve Outcome: Progressing

## 2023-04-28 NOTE — Progress Notes (Signed)
Patient ID: Yolanda [name], female   DOB: [birth year], 47 y.o.   MRN: [id number] Pt presents with depressed mood,affect blunted. Yolanda [name] continues to wait on placement and voices no acute concerns. She continues to require a bit of prompting for hygiene and to keep her room tidy. She denies any SI HI. Pt is safe, will con't to monitor.

## 2023-04-28 NOTE — Progress Notes (Signed)
[facility] MD Progress Note  04/28/2023 1:44 PM Yolanda [name]  MRN:  [id number]  Principal Problem: Schizoaffective disorder, depressive type (HCC)  Diagnosis: Principal Problem:   Schizoaffective disorder, depressive type (HCC) Active Problems:   GERD (gastroesophageal reflux disease)   Intellectual disability   Tobacco use disorder  Reason for admission: This is the first psychiatric admission in this [facility] in 12 years for this 74 AA female with an extensive hx of mental illnesses & probable polysubstance use disorders. She is admitted to the [facility] from the [facility] hospital with complain of worsening suicidal ideations with plan to stab herself. Per chart review, patient apparently reported at the ED that she has been depressed for a while & has not been taking her mental health medications. After medical evaluation.clearance, she was transferred to the [facility] for further psychiatric evaluation/treatments.   24 hour chart review: Vitals have been WNL today, with an improvement in HR yesterday from 55 to 70s -92 earlier today morninlg. Pt is continuing to be med compliant. No PRNs overnight, no concerns from overnight nursing, but pt's assigned RN today reports malodorous urine, and c/o of vaginal itching.  Patient assessment note: During today's encounter, pt is in bed lying down, appears disheveled, and the need to tend to personal and grooming reiterated. Personal hygiene supplies provided. She denies SI/HI/AVH, denies paranoia, and there is no evidence of delusional thinking.   Pt reports that she is still having the vaginal itching, and a repeat UA has been ordered, and we will await results prior to initiating antibiotic treatment if necessary. She reports a history of genital herpes, but states that the symptoms that she is experiencing at this time are unlikely an outbreak, but are more consistent with what she experiences whenever she has a UTI. She is reporting some burning on urination,  along with frequent urination, but also has episodes of incontinence at baseline. We will explore options in the coming week of getting patient a GYN exam since she most likely needs one. She is a poor historian and unable to tell writer if historically, she gets GYN exams.   She reports a good sleep quality last night, reports appetite as being fair, states that she typically does not eat breakfast, but ate lunch and plans to eat dinner tonight. She denies being in any other physical pain today other than the burning on urination. We are continuing all medications as listed below, and will await results of Urine S&S prior to treating for a UTI. We are continuing to await DSS placement in a safe living arrangement in the community since patient is incompetent of taking care of herself in the community independently due to her cognitive limitations.  Total Time spent with patient:  45 minutes  Past Psychiatric History:  See H & P  Past Medical History:  Past Medical History:  Diagnosis Date   Bipolar affect, depressed (HCC)    Constipation 08/17/2022   Depression    Falls 07/21/2022   Fracture of femoral neck, right, closed (HCC) 01/17/2022   Herpes simplex 08/22/2017   Open fracture dislocation of right elbow joint 01/17/2022    Past Surgical History:  Procedure Laterality Date   NO PAST SURGERIES     SALPINGECTOMY     Family History: History reviewed. No pertinent family history.  Family Psychiatric  History: See H & P  Social History:  Social History   Substance and Sexual Activity  Alcohol Use Yes     Social History  Substance and Sexual Activity  Drug Use Yes   Types: Cocaine, Marijuana    Social History   Socioeconomic History   Marital status: Single    Spouse name: Not on file   Number of children: Not on file   Years of education: Not on file   Highest education level: Not on file  Occupational History   Not on file  Tobacco Use   Smoking status: Every Day    Smokeless tobacco: Not on file  Substance and Sexual Activity   Alcohol use: Yes   Drug use: Yes    Types: Cocaine, Marijuana   Sexual activity: Yes  Other Topics Concern   Not on file  Social History Narrative   Not on file   Social Determinants of Health   Financial Resource Strain: Low Risk  (09/12/2022)   Received from [facility], [facility]   Overall Financial Resource Strain (CARDIA)    Difficulty of Paying Living Expenses: Not hard at all  Food Insecurity: Patient Declined (12/20/2022)   Hunger Vital Sign    Worried About Running Out of Food in the Last Year: Patient declined    Ran Out of Food in the Last Year: Patient declined  Transportation Needs: No Transportation Needs (12/20/2022)   PRAPARE - Administrator, Civil Service (Medical): No    Lack of Transportation (Non-Medical): No  Physical Activity: Not on file  Stress: No Stress Concern Present (07/17/2022)   Received from [facility], [facility] - Occupational Stress Questionnaire    Feeling of Stress : Not at all  Social Connections: Unknown (07/16/2022)   Received from [facility], [facility]   Social Network    Social Network: Not on file   Sleep: Good  Appetite:  Good  Current Medications: Current Facility-Administered Medications  Medication Dose Route Frequency Provider Last Rate Last Admin   acetaminophen (TYLENOL) tablet 650 mg  650 mg Oral Q6H PRN [name], NP   650 mg at 04/24/23 1553   alum & mag hydroxide-simeth (MAALOX/MYLANTA) 200-200-20 MG/5ML suspension 30 mL  30 mL Oral Q4H PRN [name], NP   30 mL at 01/28/23 1530   busPIRone (BUSPAR) tablet 15 mg  15 mg Oral BID [name], NP   15 mg at 04/28/23 4401   cyanocobalamin (VITAMIN B12) injection 1,000 mcg  1,000 mcg Intramuscular Q30 days Sarita Bottom, MD   1,000 mcg at 04/03/23 1237   diphenhydrAMINE (BENADRYL) capsule 50 mg  50 mg Oral TID PRN [name], NP    50 mg at 01/15/23 1211   Or   diphenhydrAMINE (BENADRYL) injection 50 mg  50 mg Intramuscular TID PRN [name], NP       docusate sodium (COLACE) capsule 100 mg  100 mg Oral Daily Mystic Labo, Tyler Aas, NP   100 mg at 04/28/23 0835   feeding supplement (ENSURE ENLIVE / ENSURE PLUS) liquid 237 mL  237 mL Oral TID BM Heiress Williamson, NP   237 mL at 04/27/23 2057   haloperidol (HALDOL) tablet 5 mg  5 mg Oral TID PRN [name], NP   5 mg at 01/15/23 1211   Or   haloperidol lactate (HALDOL) injection 5 mg  5 mg Intramuscular TID PRN [name], NP       hydrOXYzine (ATARAX) tablet 25 mg  25 mg Oral TID PRN [name], DO   25 mg at 04/26/23 2128   LORazepam (ATIVAN)  tablet 2 mg  2 mg Oral TID PRN [name], NP   2 mg at 01/15/23 1211   Or   LORazepam (ATIVAN) injection 2 mg  2 mg Intramuscular TID PRN [name], NP       magnesium hydroxide (MILK OF MAGNESIA) suspension 30 mL  30 mL Oral Daily PRN [name], NP   30 mL at 04/13/23 0814   melatonin tablet 5 mg  5 mg Oral QHS Yvonne Petite, NP   5 mg at 04/27/23 2057   nicotine (NICODERM CQ - dosed in mg/24 hours) patch 21 mg  21 mg Transdermal Daily [name], DO   21 mg at 03/25/23 2536   nicotine polacrilex (NICORETTE) gum 2 mg  2 mg Oral PRN [name], MD   2 mg at 04/28/23 0835   paliperidone (INVEGA) 24 hr tablet 6 mg  6 mg Oral Daily [name], NP   6 mg at 04/27/23 2057   pantoprazole (PROTONIX) EC tablet 40 mg  40 mg Oral Daily [name], NP   40 mg at 04/28/23 0835   polyethylene glycol (MIRALAX / GLYCOLAX) packet 17 g  17 g Oral Daily PRN [name], NP   17 g at 04/13/23 1734   sertraline (ZOLOFT) tablet 150 mg  150 mg Oral Daily Massengill, Harrold Donath, MD   150 mg at 04/28/23 6440   SUMAtriptan (IMITREX) tablet 25 mg  25 mg Oral BID PRN [name], NP   25 mg at 04/25/23 2032   traZODone (DESYREL) tablet 50 mg  50 mg Oral QHS [name], NP   50 mg at 04/27/23 2057   Vitamin D  (Ergocalciferol) (DRISDOL) 1.25 MG (50000 UNIT) capsule 50,000 Units  50,000 Units Oral Q7 days [name], NP   50,000 Units at 04/27/23 1612   Lab Results:  No results found for this or any previous visit (from the past 48 hour(s)).     Blood Alcohol level:  Lab Results  Component Value Date   ETH <10 12/19/2022   ETH <10 11/21/2019   Metabolic Disorder Labs: Lab Results  Component Value Date   HGBA1C 4.4 (L) 12/21/2022   MPG 79.58 12/21/2022   No results found for: "PROLACTIN" Lab Results  Component Value Date   CHOL 218 (H) 12/21/2022   TRIG 81 12/21/2022   HDL 53 12/21/2022   CHOLHDL 4.1 12/21/2022   VLDL 16 12/21/2022   LDLCALC 149 (H) 12/21/2022   LDLCALC 110 (H) 03/13/2011   Physical Findings: AIMS: Facial and Oral Movements Muscles of Facial Expression: None Lips and Perioral Area: None Jaw: None Tongue: None,Extremity Movements Upper (arms, wrists, hands, fingers): None Lower (legs, knees, ankles, toes): None, Trunk Movements Neck, shoulders, hips: None, Global Judgements Severity of abnormal movements overall : None Incapacitation due to abnormal movements: None Patient's awareness of abnormal movements: No Awareness, Dental Status Current problems with teeth and/or dentures?: No Does patient usually wear dentures?: No  CIWA:    COWS:    AIMS:0 Musculoskeletal: Strength & Muscle Tone: within normal limits Gait & Station: normal Patient leans: N/A  Psychiatric Specialty Exam:  Presentation  General Appearance:  Disheveled  Eye Contact: Limited to baseline Speech: Decreased amount, decreased tone and volume but at baseline Speech Volume: Normal  Handedness: Right  Mood and Affect  Mood: Sad and depressed mainly secondary to being in the hospital with no place to go Affect: Congruent  Thought Process  Thought Processes: Coherent  Descriptions of Associations:Intact  Orientation:Partial  Thought  Content:Logical Concrete History of Schizophrenia/Schizoaffective disorder:Yes  Duration of Psychotic Symptoms:Greater than six months  Hallucinations:Hallucinations: None     Ideas of Reference: None  Suicidal Thoughts:Suicidal Thoughts: No     Homicidal Thoughts:Homicidal Thoughts: No    Sensorium  Memory: Immediate Good  Judgment: Fair  Insight: Fair  Art therapist  Concentration: Fair  Attention Span: Fair  Recall: Poor  Fund of Knowledge: Poor  Language: Fair  Psychomotor Activity  Psychomotor Activity: Psychomotor Activity: Normal     Assets  Assets: Resilience  Sleep  Sleep: Sleep: Good     Physical Exam: Physical Exam Vitals and nursing note reviewed.  Cardiovascular:     Rate and Rhythm: Normal rate.  Pulmonary:     Effort: Pulmonary effort is normal.  Genitourinary:    Comments: Deferred Musculoskeletal:        General: Normal range of motion.     Cervical back: Normal range of motion.  Skin:    General: Skin is warm and dry.  Neurological:     General: No focal deficit present.     Mental Status: She is alert and oriented to person, place, and time.  Psychiatric:        Mood and Affect: Mood normal.        Behavior: Behavior normal.        Thought Content: Thought content normal.    Review of Systems  Constitutional:  Negative for fever.  Respiratory:  Negative for cough, shortness of breath and wheezing.   Gastrointestinal:  Positive for abdominal pain and vomiting. Negative for constipation.  Genitourinary:        Pain to light touch on the R side  Neurological:  Negative for dizziness.  Psychiatric/Behavioral:  Negative for depression, hallucinations, memory loss, substance abuse and suicidal ideas. The patient is nervous/anxious and has insomnia.   All other systems reviewed and are negative.  Blood pressure 106/69, pulse 92, temperature 98 F (36.7 C), temperature source Oral, resp. rate 16,  height 4\' 11"  (1.499 m), weight 63 kg, SpO2 99%. Body mass index is 28.05 kg/m.  Treatment Plan Summary: Daily contact with patient to assess and evaluate symptoms and progress in treatment and Medication management.   Will continue to monitor side pain, patient is reporting patient was sensitive to light touch on the area. At this time it may be 2/2 to patient constantly lying on that side. Patient not having urinary concerns currently.    Still waiting on safe disposition as patient would not be able to live independently given cognitive impairment. Recommend assisted living.    No medication side effects reported.  Because of urinary incontinence, recommended adult diapers to be worn.   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  Plan:  -Continue Imitrex 25 mg PRN BID for migraines -Continue Colace 100 mg daily for constipation -Continue Vitamin D 50.000 units weekly for bone health. -Continue Sertraline150 mg po Q daily for depression/anxiety -Continue Buspar 15 mg po bid for anxiety.  -Continue paliperidone 6 mg po qd for mood stabilization -Continue Trazodone 50 mg nightly for insomnia  -Continue Nicoderm 21 mg topically Q 24 hrs for nicotine withdrawal management. -Continue vitamin B12 1000 mcg IM weekly for 4 weeks then monthly -Continue Melatonin 5 mg nightly for sleep -Continue Protonix EC 40 mg p.o. daily for GERD -Continue MiraLAX 17 g  p.o. PRN for constipation -Continue hydroxyzine 25 mg p.o. 3 times daily as needed for anxiety -Continue Ensure nutritional shakes TID in between meals  -Previously discontinued Hydroxyzine 50 mg nightly-hypotension in the mornings   Safety and Monitoring: Voluntary admission to inpatient psychiatric unit  for safety, stabilization and treatment Daily contact with patient to assess and evaluate symptoms and progress in treatment Patient's case to be discussed in multi-disciplinary team meeting Observation Level : q15 minute checks Vital signs: q12 hours Precautions: Safety   Discharge Planning: Social work and case management to assist with discharge planning and identification of hospital follow-up needs prior to discharge Estimated LOS: Unknown at this time. Discharge Concerns: Need to establish a safety plan; Medication compliance and effectiveness Discharge Goals: Return home with outpatient referrals for mental health follow-up including medication management/psychotherapy No legal guardian, has payee Total Time Spent in Direct Patient Care:   I personally spent 45. minutes on the unit in direct patient care. The direct patient care time included face-to-face time with the patient, reviewing the patient's chart, communicating with other professionals, and coordinating care. Greater than 50% of this time was spent in counseling or coordinating care with the patient regarding goals of hospitalization, psycho-education, and discharge planning needs.   [name], NP,  04/28/2023, 1:44 PM Patient ID: Yolanda [name], female   DOB: [birth year], 47 y.o.   MRN: [id number]   Patient ID: TORA HEMKER, female   DOB: Oct 13, 1975, 47 y.o.   MRN: [id number]

## 2023-04-28 NOTE — Plan of Care (Signed)
  Problem: Education: Goal: Utilization of techniques to improve thought processes will improve 04/28/2023 0702 by Bethann Punches, RN Outcome: Progressing 04/28/2023 0702 by Bethann Punches, RN Outcome: Progressing 04/28/2023 0701 by Bethann Punches, RN Outcome: Progressing   Problem: Activity: Goal: Interest or engagement in leisure activities will improve 04/28/2023 0702 by Bethann Punches, RN Outcome: Progressing 04/28/2023 0702 by Bethann Punches, RN Outcome: Progressing 04/28/2023 0701 by Bethann Punches, RN Outcome: Progressing   Problem: Coping: Goal: Coping ability will improve 04/28/2023 0702 by Bethann Punches, RN Outcome: Progressing 04/28/2023 0702 by Bethann Punches, RN Outcome: Progressing 04/28/2023 0701 by Bethann Punches, RN Outcome: Progressing   Problem: Role Relationship: Goal: Will demonstrate positive changes in social behaviors and relationships Outcome: Progressing   Problem: Safety: Goal: Ability to disclose and discuss suicidal ideas will improve 04/28/2023 0702 by Bethann Punches, RN Outcome: Progressing 04/28/2023 0701 by Bethann Punches, RN Outcome: Progressing

## 2023-04-28 NOTE — Plan of Care (Signed)
  Problem: Education: Goal: Utilization of techniques to improve thought processes will improve 04/28/2023 0702 by Bethann Punches, RN Outcome: Progressing 04/28/2023 0701 by Bethann Punches, RN Outcome: Progressing   Problem: Activity: Goal: Interest or engagement in leisure activities will improve 04/28/2023 0702 by Bethann Punches, RN Outcome: Progressing 04/28/2023 0701 by Bethann Punches, RN Outcome: Progressing   Problem: Coping: Goal: Coping ability will improve 04/28/2023 0702 by Bethann Punches, RN Outcome: Progressing 04/28/2023 0701 by Bethann Punches, RN Outcome: Progressing   Problem: Role Relationship: Goal: Will demonstrate positive changes in social behaviors and relationships Outcome: Progressing   Problem: Safety: Goal: Ability to disclose and discuss suicidal ideas will improve 04/28/2023 0702 by Bethann Punches, RN Outcome: Progressing 04/28/2023 0701 by Bethann Punches, RN Outcome: Progressing

## 2023-04-28 NOTE — Group Note (Signed)
[facility] LCSW Group Therapy Note  04/28/2023  10:00-11:00AM  Type of Therapy and Topic:  Group Therapy:  Building Supports  Participation Level:  Did Not Attend   Description of Group:  Patients in this group were introduced to the idea of adding a variety of healthy supports to address the various needs in their lives.  Different types of support were defined and described, and each type was acted out.  Patients discussed what additional healthy supports could be helpful in their recovery and wellness after discharge in order to prevent future hospitalizations.   An emphasis was placed on following up with the discharge plan when they leave the hospital in order to continue becoming healthier and happier.  Two songs were played during group to help further patients' understanding.  Therapeutic Goals: 1)  demonstrate the importance of adding supports  2)  discuss 4 definitions of support  3)  identify the patient's current level of healthy support and   4)  elicit commitments to add one healthy support   Summary of Patient Progress:  Patient was invited to group, did not attend.   Therapeutic Modalities:   Psychoeducation Brief Solution-Focused Therapy  Lynnell Chad, LCSW 04/28/2023 11:34 AM

## 2023-04-28 NOTE — [facility] Group Notes (Signed)
[facility] Group Notes:  (Nursing/MHT/Case Management/Adjunct)  Date:  04/28/2023  Time:  9:03 PM  Type of Therapy:   Wrap-up group  Participation Level:  Active  Participation Quality:  Appropriate  Affect:  Appropriate  Cognitive:  Appropriate  Insight:  Appropriate  Engagement in Group:  Engaged  Modes of Intervention:  Education  Summary of Progress/Problems: Pt goal to study for her GED. Rated her day 6/10.  Noah Delaine 04/28/2023, 9:03 PM

## 2023-04-28 NOTE — BHH Group Notes (Signed)
Pt did not attend goals group. 

## 2023-04-28 NOTE — Plan of Care (Signed)
  Problem: Education: Goal: Utilization of techniques to improve thought processes will improve 04/28/2023 0702 by Bethann Punches, RN Outcome: Progressing 04/28/2023 0702 by Bethann Punches, RN Outcome: Progressing 04/28/2023 0701 by Bethann Punches, RN Outcome: Progressing   Problem: Activity: Goal: Interest or engagement in leisure activities will improve 04/28/2023 0702 by Bethann Punches, RN Outcome: Progressing 04/28/2023 0702 by Bethann Punches, RN Outcome: Progressing 04/28/2023 0702 by Bethann Punches, RN Outcome: Progressing 04/28/2023 0701 by Bethann Punches, RN Outcome: Progressing   Problem: Coping: Goal: Coping ability will improve 04/28/2023 0702 by Bethann Punches, RN Outcome: Progressing 04/28/2023 0702 by Bethann Punches, RN Outcome: Progressing 04/28/2023 0702 by Bethann Punches, RN Outcome: Progressing 04/28/2023 0701 by Bethann Punches, RN Outcome: Progressing   Problem: Role Relationship: Goal: Will demonstrate positive changes in social behaviors and relationships Outcome: Progressing   Problem: Safety: Goal: Ability to disclose and discuss suicidal ideas will improve 04/28/2023 0702 by Bethann Punches, RN Outcome: Progressing 04/28/2023 0702 by Bethann Punches, RN Outcome: Progressing 04/28/2023 0701 by Bethann Punches, RN Outcome: Progressing

## 2023-04-28 NOTE — [facility] Group Notes (Signed)
[facility] Group Notes:  (Nursing/MHT/Case Management/Adjunct)  Date:  04/28/2023  Time:  3:18 PM  Type of Therapy:  Psychoeducational Skills  Participation Level:  Did Not Attend  Participation Quality:  na  Affect:  na  Cognitive:  na  Insight:  None  Engagement in Group:  na  Modes of Intervention:  na  Summary of Progress/Problems: pt did not attend RN group  Malva Limes 04/28/2023, 3:18 PM

## 2023-04-29 ENCOUNTER — Encounter (HOSPITAL_COMMUNITY): Payer: Self-pay

## 2023-04-29 DIAGNOSIS — F251 Schizoaffective disorder, depressive type: Secondary | ICD-10-CM | POA: Diagnosis not present

## 2023-04-29 NOTE — Progress Notes (Signed)
   04/29/23 0800  Psych Admission Type (Psych Patients Only)  Admission Status Involuntary  Psychosocial Assessment  Patient Complaints Depression;Anxiety  Eye Contact Fair  Facial Expression Flat  Affect Sad  Speech Soft  Interaction Childlike  Motor Activity Slow  Appearance/Hygiene Unremarkable  Behavior Characteristics Cooperative;Appropriate to situation  Mood Anxious;Depressed  Thought Process  Coherency WDL  Content WDL  Delusions None reported or observed  Perception WDL  Hallucination None reported or observed  Judgment Impaired  Confusion None  Danger to Self  Current suicidal ideation? Denies  Description of Suicide Plan No plan  Self-Injurious Behavior No self-injurious ideation or behavior indicators observed or expressed   Agreement Not to Harm Self Yes  Description of Agreement Verbal  Danger to Others  Danger to Others None reported or observed

## 2023-04-29 NOTE — [facility] Group Notes (Signed)
Spiritual care group on grief and loss facilitated by Chaplain Katy Climmie Buelow, [facility]  Group Goal: Support / Education around grief and loss  Members engage in facilitated group support and psycho-social education.  Group Description:  Following introductions and group rules, group members engaged in facilitated group dialogue and support around topic of loss, with particular support around experiences of loss in their lives. Group Identified types of loss (relationships / self / things) and identified patterns, circumstances, and changes that precipitate losses. Reflected on thoughts / feelings around loss, normalized grief responses, and recognized variety in grief experience. Group encouraged individual reflection on safe space and on the coping skills that they are already utilizing.  Group drew on Adlerian / Rogerian and narrative framework  Patient Progress: Did not attend.  

## 2023-04-29 NOTE — [facility] Group Notes (Signed)
Adult Psychoeducational Group Note  Date:  04/29/2023 Time:  9:11 PM  Group Topic/Focus:  Wrap-Up Group:   The focus of this group is to help patients review their daily goal of treatment and discuss progress on daily workbooks.  Participation Level:  Did Not Attend  Yolanda [name] 04/29/2023, 9:11 PM

## 2023-04-29 NOTE — Plan of Care (Signed)
  Problem: Activity: Goal: Interest or engagement in leisure activities will improve Outcome: Progressing Goal: Imbalance in normal sleep/wake cycle will improve Outcome: Progressing   

## 2023-04-29 NOTE — Plan of Care (Signed)
  Problem: Coping: Goal: Coping ability will improve Outcome: Progressing Goal: Will verbalize feelings Outcome: Progressing   Problem: Health Behavior/Discharge Planning: Goal: Ability to make decisions will improve Outcome: Progressing Goal: Compliance with therapeutic regimen will improve Outcome: Progressing   Problem: Role Relationship: Goal: Will demonstrate positive changes in social behaviors and relationships Outcome: Progressing   Problem: Safety: Goal: Ability to disclose and discuss suicidal ideas will improve Outcome: Progressing Goal: Ability to identify and utilize support systems that promote safety will improve Outcome: Progressing   Problem: Self-Concept: Goal: Will verbalize positive feelings about self Outcome: Progressing Goal: Level of anxiety will decrease Outcome: Progressing

## 2023-04-29 NOTE — Progress Notes (Signed)
[facility] MD Progress Note  04/29/2023 10:22 AM Yolanda [name]  MRN:  [id number]  Principal Problem: Schizoaffective disorder, depressive type (HCC)  Diagnosis: Principal Problem:   Schizoaffective disorder, depressive type (HCC) Active Problems:   GERD (gastroesophageal reflux disease)   Intellectual disability   Tobacco use disorder  Reason for admission: This is the first psychiatric admission in this [facility] in 12 years for this 35 AA female with an extensive hx of mental illnesses & probable polysubstance use disorders. She is admitted to the [facility] from the [facility] hospital with complain of worsening suicidal ideations with plan to stab herself. Per chart review, patient apparently reported at the ED that she has been depressed for a while & has not been taking her mental health medications. After medical evaluation.clearance, she was transferred to the [facility] for further psychiatric evaluation/treatments.   Patient assessment note: Patient seen laying in bed this morning on my approach. She reports that she continues to feel depressed due to being in the hospital and being unsure about her discharge. The patient is unable to provide a safe discharge plan at this time. She has been compliant with her treatment in the hospital and she does not report any issues on the unit.    Total Time spent with patient:  20 minutes  Past Psychiatric History:  See H & P  Past Medical History:  Past Medical History:  Diagnosis Date   Bipolar affect, depressed (HCC)    Constipation 08/17/2022   Depression    Falls 07/21/2022   Fracture of femoral neck, right, closed (HCC) 01/17/2022   Herpes simplex 08/22/2017   Open fracture dislocation of right elbow joint 01/17/2022    Past Surgical History:  Procedure Laterality Date   NO PAST SURGERIES     SALPINGECTOMY     Family History: History reviewed. No pertinent family history.  Family Psychiatric  History: See H & P  Social History:  Social  History   Substance and Sexual Activity  Alcohol Use Yes     Social History   Substance and Sexual Activity  Drug Use Yes   Types: Cocaine, Marijuana    Social History   Socioeconomic History   Marital status: Single    Spouse name: Not on file   Number of children: Not on file   Years of education: Not on file   Highest education level: Not on file  Occupational History   Not on file  Tobacco Use   Smoking status: Every Day   Smokeless tobacco: Not on file  Substance and Sexual Activity   Alcohol use: Yes   Drug use: Yes    Types: Cocaine, Marijuana   Sexual activity: Yes  Other Topics Concern   Not on file  Social History Narrative   Not on file   Social Determinants of Health   Financial Resource Strain: Low Risk  (09/12/2022)   Received from [facility], [facility]   Overall Financial Resource Strain (CARDIA)    Difficulty of Paying Living Expenses: Not hard at all  Food Insecurity: Patient Declined (12/20/2022)   Hunger Vital Sign    Worried About Running Out of Food in the Last Year: Patient declined    Ran Out of Food in the Last Year: Patient declined  Transportation Needs: No Transportation Needs (12/20/2022)   PRAPARE - Administrator, Civil Service (Medical): No    Lack of Transportation (Non-Medical): No  Physical Activity: Not on file  Stress: No Stress  Concern Present (07/17/2022)   Received from [facility], [facility] - Occupational Stress Questionnaire    Feeling of Stress : Not at all  Social Connections: Unknown (07/16/2022)   Received from [facility], [facility]   Social Network    Social Network: Not on file   Sleep: Good  Appetite:  Good  Current Medications: Current Facility-Administered Medications  Medication Dose Route Frequency Provider Last Rate Last Admin   acetaminophen (TYLENOL) tablet 650 mg  650 mg Oral Q6H PRN [name], NP   650 mg at 04/24/23 1553    alum & mag hydroxide-simeth (MAALOX/MYLANTA) 200-200-20 MG/5ML suspension 30 mL  30 mL Oral Q4H PRN [name], NP   30 mL at 01/28/23 1530   busPIRone (BUSPAR) tablet 15 mg  15 mg Oral BID [name], NP   15 mg at 04/29/23 0809   cyanocobalamin (VITAMIN B12) injection 1,000 mcg  1,000 mcg Intramuscular Q30 days Sarita Bottom, MD   1,000 mcg at 04/03/23 1237   diphenhydrAMINE (BENADRYL) capsule 50 mg  50 mg Oral TID PRN [name], NP   50 mg at 01/15/23 1211   Or   diphenhydrAMINE (BENADRYL) injection 50 mg  50 mg Intramuscular TID PRN [name], NP       docusate sodium (COLACE) capsule 100 mg  100 mg Oral Daily Nkwenti, Tyler Aas, NP   100 mg at 04/29/23 0809   feeding supplement (ENSURE ENLIVE / ENSURE PLUS) liquid 237 mL  237 mL Oral TID BM Nkwenti, Tyler Aas, NP   237 mL at 04/29/23 0943   haloperidol (HALDOL) tablet 5 mg  5 mg Oral TID PRN [name], NP   5 mg at 01/15/23 1211   Or   haloperidol lactate (HALDOL) injection 5 mg  5 mg Intramuscular TID PRN [name], NP       hydrOXYzine (ATARAX) tablet 25 mg  25 mg Oral TID PRN [name], DO   25 mg at 04/26/23 2128   LORazepam (ATIVAN) tablet 2 mg  2 mg Oral TID PRN [name], NP   2 mg at 01/15/23 1211   Or   LORazepam (ATIVAN) injection 2 mg  2 mg Intramuscular TID PRN [name], NP       magnesium hydroxide (MILK OF MAGNESIA) suspension 30 mL  30 mL Oral Daily PRN [name], NP   30 mL at 04/13/23 0814   melatonin tablet 5 mg  5 mg Oral QHS Nkwenti, Doris, NP   5 mg at 04/28/23 2050   nicotine (NICODERM CQ - dosed in mg/24 hours) patch 21 mg  21 mg Transdermal Daily [name], DO   21 mg at 03/25/23 1610   nicotine polacrilex (NICORETTE) gum 2 mg  2 mg Oral PRN [name], MD   2 mg at 04/28/23 1634   paliperidone (INVEGA) 24 hr tablet 6 mg  6 mg Oral Daily [name], NP   6 mg at 04/28/23 2050   pantoprazole (PROTONIX) EC tablet 40 mg  40 mg Oral Daily Nwoko, Nicole Kindred I, NP   40 mg at  04/29/23 0809   polyethylene glycol (MIRALAX / GLYCOLAX) packet 17 g  17 g Oral Daily PRN [name], NP   17 g at 04/13/23 1734   sertraline (ZOLOFT) tablet 150 mg  150 mg Oral Daily Massengill, Harrold Donath, MD   150 mg at 04/29/23 0810   SUMAtriptan (IMITREX) tablet 25 mg  25 mg  Oral BID PRN [name], NP   25 mg at 04/25/23 2032   traZODone (DESYREL) tablet 50 mg  50 mg Oral QHS [name], NP   50 mg at 04/28/23 2050   Vitamin D (Ergocalciferol) (DRISDOL) 1.25 MG (50000 UNIT) capsule 50,000 Units  50,000 Units Oral Q7 days [name], NP   50,000 Units at 04/27/23 1612   Lab Results:  Results for orders placed or performed during the hospital encounter of 12/20/22 (from the past 48 hour(s))  Urinalysis, w/ Reflex to Culture (Infection Suspected) -Urine, Clean Catch     Status: Abnormal   Collection Time: 04/28/23 12:50 PM  Result Value Ref Range   Specimen Source URINE, CLEAN CATCH    Color, Urine YELLOW YELLOW   APPearance CLEAR CLEAR   Specific Gravity, Urine 1.017 1.005 - 1.030   pH 5.0 5.0 - 8.0   Glucose, UA NEGATIVE NEGATIVE mg/dL   Hgb urine dipstick NEGATIVE NEGATIVE   Bilirubin Urine NEGATIVE NEGATIVE   Ketones, ur NEGATIVE NEGATIVE mg/dL   Protein, ur NEGATIVE NEGATIVE mg/dL   Nitrite NEGATIVE NEGATIVE   Leukocytes,Ua TRACE (A) NEGATIVE   RBC / HPF 0-5 0 - 5 RBC/hpf   WBC, UA 6-10 0 - 5 WBC/hpf    Comment:        Reflex urine culture not performed if WBC <=10, OR if Squamous epithelial cells >5. If Squamous epithelial cells >5 suggest recollection.    Bacteria, UA RARE (A) NONE SEEN   Squamous Epithelial / HPF 0-5 0 - 5 /HPF   Mucus PRESENT     Comment: Performed at [facility], [address] [place], [place] [address]       Blood Alcohol level:  Lab Results  Component Value Date   [facility] <10 12/19/2022   ETH <10 11/21/2019   Metabolic Disorder Labs: Lab Results  Component Value Date   HGBA1C 4.4 (L) 12/21/2022   MPG  79.58 12/21/2022   No results found for: "PROLACTIN" Lab Results  Component Value Date   CHOL 218 (H) 12/21/2022   TRIG 81 12/21/2022   HDL 53 12/21/2022   CHOLHDL 4.1 12/21/2022   VLDL 16 12/21/2022   LDLCALC 149 (H) 12/21/2022   LDLCALC 110 (H) 03/13/2011   Physical Findings: AIMS: Facial and Oral Movements Muscles of Facial Expression: None Lips and Perioral Area: None Jaw: None Tongue: None,Extremity Movements Upper (arms, wrists, hands, fingers): None Lower (legs, knees, ankles, toes): None, Trunk Movements Neck, shoulders, hips: None, Global Judgements Severity of abnormal movements overall : None Incapacitation due to abnormal movements: None Patient's awareness of abnormal movements: No Awareness, Dental Status Current problems with teeth and/or dentures?: No Does patient usually wear dentures?: No  CIWA:    COWS:    AIMS:0 Musculoskeletal: Strength & Muscle Tone: within normal limits Gait & Station: normal Patient leans: N/A  Psychiatric Specialty Exam:  Presentation  General Appearance:  Disheveled  Eye Contact: Limited to baseline Speech: Decreased amount, decreased tone and volume but at baseline Speech Volume: Decreased  Handedness: Right  Mood and Affect  Mood: Sad and depressed mainly secondary to being in the hospital with no place to go Affect: Depressed  Thought Process  Thought Processes: Coherent  Descriptions of Associations:Intact  Orientation:Full (Time, Place and Person)  Thought Content:Logical [place] History of Schizophrenia/Schizoaffective disorder:Yes  Duration of Psychotic Symptoms:Greater than six months  Hallucinations:Hallucinations: None     Ideas of Reference: None  Suicidal Thoughts:Suicidal Thoughts: No  Homicidal Thoughts:Homicidal Thoughts: No    Sensorium  Memory: Immediate Good  Judgment: Fair  Insight: Poor  Executive Functions  Concentration: Fair  Attention  Span: Fair  Recall: Fair  Fund of Knowledge: Poor  Language: Good  Psychomotor Activity  Psychomotor Activity: Psychomotor Activity: Normal     Assets  Assets: Resilience  Sleep  Sleep: Sleep: Good     Physical Exam: Physical Exam Vitals and nursing note reviewed.  Cardiovascular:     Rate and Rhythm: Normal rate.  Pulmonary:     Effort: Pulmonary effort is normal.  Genitourinary:    Comments: Deferred Musculoskeletal:        General: Normal range of motion.     Cervical back: Normal range of motion.  Skin:    General: Skin is warm and dry.  Neurological:     General: No focal deficit present.     Mental Status: She is alert and oriented to person, place, and time.  Psychiatric:        Mood and Affect: Mood normal.        Behavior: Behavior normal.        Thought Content: Thought content normal.    Review of Systems  Constitutional:  Negative for fever.  Respiratory:  Negative for cough, shortness of breath and wheezing.   Gastrointestinal:  Positive for abdominal pain and vomiting. Negative for constipation.  Genitourinary:        Pain to light touch on the R side  Neurological:  Negative for dizziness.  Psychiatric/Behavioral:  Negative for depression, hallucinations, memory loss, substance abuse and suicidal ideas. The patient is nervous/anxious and has insomnia.   All other systems reviewed and are negative.  Blood pressure 96/73, pulse 72, temperature 98 F (36.7 C), temperature source Oral, resp. rate 16, height 4\' 11"  (1.499 m), weight 63 kg, SpO2 99%. Body mass index is 28.05 kg/m.  Treatment Plan Summary: Daily contact with patient to assess and evaluate symptoms and progress in treatment and Medication management.   Will continue to monitor side pain, patient is reporting patient was sensitive to light touch on the area. At this time it may be 2/2 to patient constantly lying on that side. Patient not having urinary concerns currently.     Still waiting on safe disposition as patient would not be able to live independently given cognitive impairment. Recommend assisted living.    No medication side effects reported.  Because of urinary incontinence, recommended adult diapers to be worn.   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  Plan:  -Continue Imitrex 25 mg PRN BID for migraines -Continue Colace 100 mg daily for constipation -Continue Vitamin D 50.000 units weekly for bone health. -Continue Sertraline150 mg po Q daily for depression/anxiety -Continue Buspar 15 mg po bid for anxiety.  -Continue paliperidone 6 mg po qd for mood stabilization -Continue Trazodone 50 mg nightly for insomnia  -Continue Nicoderm 21 mg topically Q 24 hrs for nicotine withdrawal management. -Continue vitamin B12 1000 mcg IM weekly for 4 weeks then monthly -Continue Melatonin 5 mg nightly for sleep -Continue Protonix EC 40 mg p.o. daily for GERD -Continue MiraLAX 17 g p.o. PRN for constipation -Continue hydroxyzine 25 mg p.o. 3 times daily as needed for anxiety -Continue Ensure nutritional shakes TID in between meals  -Previously discontinued Hydroxyzine 50 mg nightly-hypotension in the mornings  Safety and Monitoring: Voluntary admission to inpatient psychiatric unit for safety, stabilization and treatment Daily contact with patient to assess and evaluate symptoms and progress in treatment Patient's case to be discussed in multi-disciplinary team meeting Observation Level : q15 minute checks Vital signs: q12 hours Precautions: Safety   Discharge Planning: Social work and case management to assist with discharge planning and identification of hospital follow-up needs prior to discharge Estimated  LOS: Unknown at this time. Discharge Concerns: Need to establish a safety plan; Medication compliance and effectiveness Discharge Goals: Return home with outpatient referrals for mental health follow-up including medication management/psychotherapy No legal guardian, has payee Total Time Spent in Direct Patient Care:   I personally spent 45. minutes on the unit in direct patient care. The direct patient care time included face-to-face time with the patient, reviewing the patient's chart, communicating with other professionals, and coordinating care. Greater than 50% of this time was spent in counseling or coordinating care with the patient regarding goals of hospitalization, psycho-education, and discharge planning needs.   Harlin Heys, DO,  04/29/2023, 10:22 AM Patient ID: Yolanda [name], female   DOB: Jun 01, 1976, 47 y.o.   MRN: [id number]   Patient ID: Yolanda [name], female   DOB: October 10, 1975, 47 y.o.   MRN: [id number]

## 2023-04-29 NOTE — Group Note (Signed)
Date:  04/29/2023 Time:  9:30 PM  Group Topic/Focus:  Wrap-Up Group:   The focus of this group is to help patients review their daily goal of treatment and discuss progress on daily workbooks.    Participation Level:  Did Not Attend  Participation Quality:   N/A  Affect:   N/A  Cognitive:   N/A  Insight: None  Engagement in Group:   N/A  Modes of Intervention:   N/A  Additional Comments:  Patient did not attend wrap up group.  Kennieth Francois 04/29/2023, 9:30 PM

## 2023-04-29 NOTE — Group Note (Signed)
Recreation Therapy Group Note   Group Topic:Stress Management  Group Date: 04/29/2023 Start Time: 0935 End Time: 1000 Facilitators: Viola Placeres-McCall, LRT,CTRS Location: [address] Dayroom   Goal Area(s) Addresses:  Patient will actively participate in stress management techniques presented during session.  Patient will successfully identify benefit of practicing stress management post d/c.   Intervention: Relaxation exercise with ambient sound and script   Group Description: Guided Imagery. LRT provided education, instruction, and demonstration on practice of visualization via guided imagery. Patient was asked to participate in the technique introduced during session. LRT debriefed including topics of mindfulness, stress management and specific scenarios each patient could use these techniques. Patients were given suggestions of ways to access scripts post d/c and encouraged to explore Youtube and other apps available on smartphones, tablets, and computers.  Education:  Stress Management, Discharge Planning.   Education Outcome: Acknowledges education   Affect/Mood: N/A   Participation Level: Did not attend    Clinical Observations/Individualized Feedback:     Plan: Continue to engage patient in RT group sessions 2-3x/week.   Ayat Drenning-McCall, LRT,CTRS 04/29/2023 12:40 PM

## 2023-04-29 NOTE — BH IP Treatment Plan (Signed)
Interdisciplinary Treatment and Diagnostic Plan Update  04/29/2023 Time of Session: 11:10AM UPDATE Yolanda [name] MRN: [id number]  Principal Diagnosis: Schizoaffective disorder, depressive type (HCC)  Secondary Diagnoses: Principal Problem:   Schizoaffective disorder, depressive type (HCC) Active Problems:   GERD (gastroesophageal reflux disease)   Intellectual disability   Tobacco use disorder   Current Medications:  Current Facility-Administered Medications  Medication Dose Route Frequency Provider Last Rate Last Admin   acetaminophen (TYLENOL) tablet 650 mg  650 mg Oral Q6H PRN [name], NP   650 mg at 04/29/23 1145   alum & mag hydroxide-simeth (MAALOX/MYLANTA) 200-200-20 MG/5ML suspension 30 mL  30 mL Oral Q4H PRN [name], NP   30 mL at 01/28/23 1530   busPIRone (BUSPAR) tablet 15 mg  15 mg Oral BID [name], NP   15 mg at 04/29/23 0809   cyanocobalamin (VITAMIN B12) injection 1,000 mcg  1,000 mcg Intramuscular Q30 days Sarita Bottom, MD   1,000 mcg at 04/03/23 1237   diphenhydrAMINE (BENADRYL) capsule 50 mg  50 mg Oral TID PRN [name], NP   50 mg at 01/15/23 1211   Or   diphenhydrAMINE (BENADRYL) injection 50 mg  50 mg Intramuscular TID PRN [name], NP       docusate sodium (COLACE) capsule 100 mg  100 mg Oral Daily Nkwenti, Doris, NP   100 mg at 04/29/23 0809   feeding supplement (ENSURE ENLIVE / ENSURE PLUS) liquid 237 mL  237 mL Oral TID BM Nkwenti, Doris, NP   237 mL at 04/29/23 0943   haloperidol (HALDOL) tablet 5 mg  5 mg Oral TID PRN [name], NP   5 mg at 01/15/23 1211   Or   haloperidol lactate (HALDOL) injection 5 mg  5 mg Intramuscular TID PRN [name], NP       hydrOXYzine (ATARAX) tablet 25 mg  25 mg Oral TID PRN [name], DO   25 mg at 04/26/23 2128   LORazepam (ATIVAN) tablet 2 mg  2 mg Oral TID PRN [name], NP   2 mg at 01/15/23 1211   Or   LORazepam (ATIVAN) injection 2 mg  2 mg Intramuscular TID PRN  [name], NP       magnesium hydroxide (MILK OF MAGNESIA) suspension 30 mL  30 mL Oral Daily PRN [name], NP   30 mL at 04/13/23 0814   melatonin tablet 5 mg  5 mg Oral QHS Nkwenti, Doris, NP   5 mg at 04/28/23 2050   nicotine (NICODERM CQ - dosed in mg/24 hours) patch 21 mg  21 mg Transdermal Daily [name], DO   21 mg at 03/25/23 2025   nicotine polacrilex (NICORETTE) gum 2 mg  2 mg Oral PRN [name], MD   2 mg at 04/29/23 1146   paliperidone (INVEGA) 24 hr tablet 6 mg  6 mg Oral Daily Nkwenti, Tyler Aas, NP   6 mg at 04/28/23 2050   pantoprazole (PROTONIX) EC tablet 40 mg  40 mg Oral Daily Nwoko, Nicole Kindred I, NP   40 mg at 04/29/23 0809   polyethylene glycol (MIRALAX / GLYCOLAX) packet 17 g  17 g Oral Daily PRN [name], NP   17 g at 04/13/23 1734   sertraline (ZOLOFT) tablet 150 mg  150 mg Oral Daily Massengill, Harrold Donath, MD   150 mg at 04/29/23 0810   SUMAtriptan (IMITREX) tablet 25 mg  25 mg Oral BID PRN [name], NP   25  mg at 04/25/23 2032   traZODone (DESYREL) tablet 50 mg  50 mg Oral QHS [name], NP   50 mg at 04/28/23 2050   Vitamin D (Ergocalciferol) (DRISDOL) 1.25 MG (50000 UNIT) capsule 50,000 Units  50,000 Units Oral Q7 days [name], NP   50,000 Units at 04/27/23 1612   PTA Medications: Medications Prior to Admission  Medication Sig Dispense Refill Last Dose   busPIRone (BUSPAR) 15 MG tablet Take 15 mg by mouth 2 (two) times daily. (Patient not taking: Reported on 12/19/2022)      paliperidone (INVEGA SUSTENNA) 156 MG/ML SUSY injection Inject 156 mg into the muscle once. (Patient not taking: Reported on 12/19/2022)      sertraline (ZOLOFT) 50 MG tablet Take 150 mg by mouth daily. (Patient not taking: Reported on 12/19/2022)      traZODone (DESYREL) 100 MG tablet Take 100 mg by mouth at bedtime as needed for sleep. (Patient not taking: Reported on 11/12/2022)       Patient Stressors: Medication change or noncompliance    Patient Strengths:  Forensic psychologist fund of knowledge   Treatment Modalities: Medication Management, Group therapy, Case management,  1 to 1 session with clinician, Psychoeducation, Recreational therapy.   Physician Treatment Plan for Primary Diagnosis: Schizoaffective disorder, depressive type (HCC) Long Term Goal(s): Improvement in symptoms so as ready for discharge   Short Term Goals: Ability to identify and develop effective coping behaviors will improve Ability to maintain clinical measurements within normal limits will improve Compliance with prescribed medications will improve Ability to identify triggers associated with substance abuse/mental health issues will improve Ability to identify changes in lifestyle to reduce recurrence of condition will improve Ability to verbalize feelings will improve Ability to disclose and discuss suicidal ideas Ability to demonstrate self-control will improve  Medication Management: Evaluate patient's response, side effects, and tolerance of medication regimen.  Therapeutic Interventions: 1 to 1 sessions, Unit Group sessions and Medication administration.  Evaluation of Outcomes: Progressing  Physician Treatment Plan for Secondary Diagnosis: Principal Problem:   Schizoaffective disorder, depressive type (HCC) Active Problems:   GERD (gastroesophageal reflux disease)   Intellectual disability   Tobacco use disorder  Long Term Goal(s): Improvement in symptoms so as ready for discharge   Short Term Goals: Ability to identify and develop effective coping behaviors will improve Ability to maintain clinical measurements within normal limits will improve Compliance with prescribed medications will improve Ability to identify triggers associated with substance abuse/mental health issues will improve Ability to identify changes in lifestyle to reduce recurrence of condition will improve Ability to verbalize feelings will improve Ability to disclose  and discuss suicidal ideas Ability to demonstrate self-control will improve     Medication Management: Evaluate patient's response, side effects, and tolerance of medication regimen.  Therapeutic Interventions: 1 to 1 sessions, Unit Group sessions and Medication administration.  Evaluation of Outcomes: Progressing   RN Treatment Plan for Primary Diagnosis: Schizoaffective disorder, depressive type (HCC) Long Term Goal(s): Knowledge of disease and therapeutic regimen to maintain health will improve  Short Term Goals: Ability to remain free from injury will improve, Ability to participate in decision making will improve, Ability to disclose and discuss suicidal ideas, Ability to identify and develop effective coping behaviors will improve, and Compliance with prescribed medications will improve  Medication Management: RN will administer medications as ordered by provider, will assess and evaluate patient's response and provide education to patient for prescribed medication. RN will report any adverse and/or side effects  to prescribing provider.  Therapeutic Interventions: 1 on 1 counseling sessions, Psychoeducation, Medication administration, Evaluate responses to treatment, Monitor vital signs and CBGs as ordered, Perform/monitor CIWA, COWS, AIMS and Fall Risk screenings as ordered, Perform wound care treatments as ordered.  Evaluation of Outcomes: Progressing   LCSW Treatment Plan for Primary Diagnosis: Schizoaffective disorder, depressive type (HCC) Long Term Goal(s): Safe transition to appropriate next level of care at discharge, Engage patient in therapeutic group addressing interpersonal concerns.  Short Term Goals: Engage patient in aftercare planning with referrals and resources, Increase social support, Increase emotional regulation, Facilitate acceptance of mental health diagnosis and concerns, Identify triggers associated with mental health/substance abuse issues, and Increase  skills for wellness and recovery  Therapeutic Interventions: Assess for all discharge needs, 1 to 1 time with Social worker, Explore available resources and support systems, Assess for adequacy in community support network, Educate family and significant other(s) on suicide prevention, Complete Psychosocial Assessment, Interpersonal group therapy.  Evaluation of Outcomes: Progressing   Progress in Treatment: Attending groups: Yes. Participating in groups: Yes. Taking medication as prescribed: Yes. Toleration medication: Yes. Family/Significant other contact made: Yes, individual(s) contacted:  Mother Yolanda [name] Patient understands diagnosis: Yes. Discussing patient identified problems/goals with staff: Yes. Medical problems stabilized or resolved: Yes. Denies suicidal/homicidal ideation: Yes. Issues/concerns per patient self-inventory: No. Other: N/A   New problem(s) identified: No, Describe:  None reported   New Short Term/Long Term Goal(s): medication stabilization, elimination of SI thoughts, development of comprehensive mental wellness plan.      Patient Goals:  "Housing"   Discharge Plan or Barriers: Patient recently admitted. CSW will continue to follow and assess for appropriate referrals and possible discharge planning.    Reason for Continuation of Hospitalization: Anxiety Depression Medication stabilization Suicidal ideation   Estimated Length of Stay: TBD  Last 3 [place] Suicide Severity Risk Score: Flowsheet Row Admission (Current) [facility] from [facility]19/2022 in [facility] ED from 11/12/2022 in [facility] Emergency Department [facility]  C-SSRS RISK CATEGORY High Risk High Risk Moderate Risk       Last [facility] 2/9 Scores:     No data to display          Scribe for Treatment Team: Kathi Der, LCSWA 04/29/2023 2:19 PM

## 2023-04-30 DIAGNOSIS — F251 Schizoaffective disorder, depressive type: Secondary | ICD-10-CM | POA: Diagnosis not present

## 2023-04-30 NOTE — Progress Notes (Signed)
[facility] MD Progress Note  04/30/2023 12:19 PM Yolanda [name]  MRN:  [id number]  Principal Problem: Schizoaffective disorder, depressive type (HCC)  Diagnosis: Principal Problem:   Schizoaffective disorder, depressive type (HCC) Active Problems:   GERD (gastroesophageal reflux disease)   Intellectual disability   Tobacco use disorder  Reason for admission: This is the first psychiatric admission in this [facility] in 12 years for this 47 AA female with an extensive hx of mental illnesses & probable polysubstance use disorders. She is admitted to the [facility] from the [facility] hospital with complain of worsening suicidal ideations with plan to stab herself. Per chart review, patient apparently reported at the ED that she has been depressed for a while & has not been taking her mental health medications. After medical evaluation.clearance, she was transferred to the [facility] for further psychiatric evaluation/treatments.   Patient assessment note: Staff reports that the patient has been cooperative and compliant.  No changes made.  She slept fair.  She is attending groups.  When seen today patient was lying in bed.  She was somewhat drowsy and endorsed some feelings of depression while being in the hospital Zoloft.  She is unable to provide a safe discharge plan at this time.  She has been compliant with treatment and endorses some improvement of her symptoms on the medications.  She is contracting for safety.  Total Time spent with patient:  20 minutes  Past Psychiatric History:  See H & P  Past Medical History:  Past Medical History:  Diagnosis Date   Bipolar affect, depressed (HCC)    Constipation 08/17/2022   Depression    Falls 07/21/2022   Fracture of femoral neck, right, closed (HCC) 01/17/2022   Herpes simplex 08/22/2017   Open fracture dislocation of right elbow joint 01/17/2022    Past Surgical History:  Procedure Laterality Date   NO PAST SURGERIES     SALPINGECTOMY     Family  History: History reviewed. No pertinent family history.  Family Psychiatric  History: See H & P  Social History:  Social History   Substance and Sexual Activity  Alcohol Use Yes     Social History   Substance and Sexual Activity  Drug Use Yes   Types: Cocaine, Marijuana    Social History   Socioeconomic History   Marital status: Single    Spouse name: Not on file   Number of children: Not on file   Years of education: Not on file   Highest education level: Not on file  Occupational History   Not on file  Tobacco Use   Smoking status: Every Day   Smokeless tobacco: Not on file  Substance and Sexual Activity   Alcohol use: Yes   Drug use: Yes    Types: Cocaine, Marijuana   Sexual activity: Yes  Other Topics Concern   Not on file  Social History Narrative   Not on file   Social Determinants of Health   Financial Resource Strain: Low Risk  (09/12/2022)   Received from [facility]   Overall Financial Resource Strain (CARDIA)    Difficulty of Paying Living Expenses: Not hard at all  Food Insecurity: Patient Declined (12/20/2022)   Hunger Vital Sign    Worried About Running Out of Food in the Last Year: Patient declined    Ran Out of Food in the Last Year: Patient declined  Transportation Needs: No Transportation Needs (12/20/2022)   PRAPARE - Administrator, Civil Service (  Medical): No    Lack of Transportation (Non-Medical): No  Physical Activity: Not on file  Stress: No Stress Concern Present (07/17/2022)   Received from [facility] Occupational [facility] - Occupational Stress Questionnaire    Feeling of Stress : Not at all  Social Connections: Unknown (07/16/2022)   Received from [facility]   Social Network    Social Network: Not on file   Sleep: Good  Appetite:  Good  Current Medications: Current Facility-Administered Medications  Medication Dose Route Frequency Provider Last  Rate Last Admin   acetaminophen (TYLENOL) tablet 650 mg  650 mg Oral Q6H PRN [name], NP   650 mg at 04/29/23 1145   alum & mag hydroxide-simeth (MAALOX/MYLANTA) 200-200-20 MG/5ML suspension 30 mL  30 mL Oral Q4H PRN [name], NP   30 mL at 01/28/23 1530   busPIRone (BUSPAR) tablet 15 mg  15 mg Oral BID [name], NP   15 mg at 04/30/23 1610   cyanocobalamin (VITAMIN B12) injection 1,000 mcg  1,000 mcg Intramuscular Q30 days Sarita Bottom, MD   1,000 mcg at 04/03/23 1237   diphenhydrAMINE (BENADRYL) capsule 50 mg  50 mg Oral TID PRN [name], NP   50 mg at 01/15/23 1211   Or   diphenhydrAMINE (BENADRYL) injection 50 mg  50 mg Intramuscular TID PRN [name], NP       docusate sodium (COLACE) capsule 100 mg  100 mg Oral Daily [name], NP   100 mg at 04/30/23 9604   feeding supplement (ENSURE ENLIVE / ENSURE PLUS) liquid 237 mL  237 mL Oral TID BM Nkwenti, Doris, NP   237 mL at 04/30/23 0930   haloperidol (HALDOL) tablet 5 mg  5 mg Oral TID PRN [name], NP   5 mg at 01/15/23 1211   Or   haloperidol lactate (HALDOL) injection 5 mg  5 mg Intramuscular TID PRN [name], NP       hydrOXYzine (ATARAX) tablet 25 mg  25 mg Oral TID PRN [name], DO   25 mg at 04/26/23 2128   LORazepam (ATIVAN) tablet 2 mg  2 mg Oral TID PRN [name], NP   2 mg at 01/15/23 1211   Or   LORazepam (ATIVAN) injection 2 mg  2 mg Intramuscular TID PRN [name], NP       magnesium hydroxide (MILK OF MAGNESIA) suspension 30 mL  30 mL Oral Daily PRN [name], NP   30 mL at 04/13/23 0814   melatonin tablet 5 mg  5 mg Oral QHS Nkwenti, Doris, NP   5 mg at 04/29/23 2122   nicotine (NICODERM CQ - dosed in mg/24 hours) patch 21 mg  21 mg Transdermal Daily [name], DO   21 mg at 03/25/23 5409   nicotine polacrilex (NICORETTE) gum 2 mg  2 mg Oral PRN [name], MD   2 mg at 04/29/23 1715   paliperidone (INVEGA) 24 hr tablet 6 mg  6 mg Oral Daily [name],  NP   6 mg at 04/29/23 2122   pantoprazole (PROTONIX) EC tablet 40 mg  40 mg Oral Daily [name], NP   40 mg at 04/30/23 0733   polyethylene glycol (MIRALAX / GLYCOLAX) packet 17 g  17 g Oral Daily PRN [name], NP   17 g at 04/13/23 1734   sertraline (ZOLOFT) tablet 150 mg  150 mg Oral Daily  Phineas Inches, MD   150 mg at 04/30/23 8119   SUMAtriptan (IMITREX) tablet 25 mg  25 mg Oral BID PRN [name], NP   25 mg at 04/29/23 1714   traZODone (DESYREL) tablet 50 mg  50 mg Oral QHS [name], NP   50 mg at 04/29/23 2122   Vitamin D (Ergocalciferol) (DRISDOL) 1.25 MG (50000 UNIT) capsule 50,000 Units  50,000 Units Oral Q7 days [name], NP   50,000 Units at 04/27/23 1612   Lab Results:  Results for orders placed or performed during the hospital encounter of 12/20/22 (from the past 48 hour(s))  Urinalysis, w/ Reflex to Culture (Infection Suspected) -Urine, Clean Catch     Status: Abnormal   Collection Time: 04/28/23 12:50 PM  Result Value Ref Range   Specimen Source URINE, CLEAN CATCH    Color, Urine YELLOW YELLOW   APPearance CLEAR CLEAR   Specific Gravity, Urine 1.017 1.005 - 1.030   pH 5.0 5.0 - 8.0   Glucose, UA NEGATIVE NEGATIVE mg/dL   Hgb urine dipstick NEGATIVE NEGATIVE   Bilirubin Urine NEGATIVE NEGATIVE   Ketones, ur NEGATIVE NEGATIVE mg/dL   Protein, ur NEGATIVE NEGATIVE mg/dL   Nitrite NEGATIVE NEGATIVE   Leukocytes,Ua TRACE (A) NEGATIVE   RBC / HPF 0-5 0 - 5 RBC/hpf   WBC, UA 6-10 0 - 5 WBC/hpf    Comment:        Reflex urine culture not performed if WBC <=10, OR if Squamous epithelial cells >5. If Squamous epithelial cells >5 suggest recollection.    Bacteria, UA RARE (A) NONE SEEN   Squamous Epithelial / HPF 0-5 0 - 5 /HPF   Mucus PRESENT     Comment: Performed at [facility], [address] [place], [place] [address]       Blood Alcohol level:  Lab Results  Component Value Date   [facility] <10 12/19/2022   ETH  <10 11/21/2019   Metabolic Disorder Labs: Lab Results  Component Value Date   HGBA1C 4.4 (L) 12/21/2022   MPG 79.58 12/21/2022   No results found for: "PROLACTIN" Lab Results  Component Value Date   CHOL 218 (H) 12/21/2022   TRIG 81 12/21/2022   HDL 53 12/21/2022   CHOLHDL 4.1 12/21/2022   VLDL 16 12/21/2022   LDLCALC 149 (H) 12/21/2022   LDLCALC 110 (H) 03/13/2011   Physical Findings: AIMS: Facial and Oral Movements Muscles of Facial Expression: None Lips and Perioral Area: None Jaw: None Tongue: None,Extremity Movements Upper (arms, wrists, hands, fingers): None Lower (legs, knees, ankles, toes): None, Trunk Movements Neck, shoulders, hips: None, Global Judgements Severity of abnormal movements overall : None Incapacitation due to abnormal movements: None Patient's awareness of abnormal movements: No Awareness, Dental Status Current problems with teeth and/or dentures?: No Does patient usually wear dentures?: No  CIWA:    COWS:    AIMS:0 Musculoskeletal: Strength & Muscle Tone: within normal limits Gait & Station: normal Patient leans: N/A  Psychiatric Specialty Exam:  Presentation  General Appearance:  Casual  Eye Contact: Limited to baseline Speech: Decreased amount, decreased tone and volume but at baseline Speech Volume: Decreased  Handedness: Right  Mood and Affect  Mood: Sad and depressed mainly secondary to being in the hospital with no place to go Affect: Blunt  Thought Process  Thought Processes: Coherent  Descriptions of Associations:Intact  Orientation:Full (Time, Place and Person)  Thought Content:Logical Concrete History of Schizophrenia/Schizoaffective disorder:Yes  Duration of Psychotic Symptoms:Greater than six  months  Hallucinations:Hallucinations: None     Ideas of Reference: None  Suicidal Thoughts:Suicidal Thoughts: No     Homicidal Thoughts:Homicidal Thoughts: No    Sensorium  Memory: Immediate Fair;  Recent Fair; Remote Fair  Judgment: Fair  Insight: Fair  Chartered certified accountant: Fair  Attention Span: Fair  Recall: [employer] of Knowledge: Fair  Language: Fair  Psychomotor Activity  Psychomotor Activity: Psychomotor Activity: Normal     Assets  Assets: Desire for Improvement; Resilience  Sleep  Sleep: Sleep: Fair     Physical Exam: Physical Exam Vitals and nursing note reviewed.  Cardiovascular:     Rate and Rhythm: Normal rate.  Pulmonary:     Effort: Pulmonary effort is normal.  Genitourinary:    Comments: Deferred Musculoskeletal:        General: Normal range of motion.     Cervical back: Normal range of motion.  Skin:    General: Skin is warm and dry.  Neurological:     General: No focal deficit present.     Mental Status: She is alert and oriented to person, place, and time.  Psychiatric:        Mood and Affect: Mood normal.        Behavior: Behavior normal.        Thought Content: Thought content normal.    Review of Systems  Constitutional:  Negative for fever.  Respiratory:  Negative for cough, shortness of breath and wheezing.   Gastrointestinal:  Positive for abdominal pain and vomiting. Negative for constipation.  Genitourinary:        Pain to light touch on the R side  Neurological:  Negative for dizziness.  Psychiatric/Behavioral:  Negative for depression, hallucinations, memory loss, substance abuse and suicidal ideas. The patient is nervous/anxious and has insomnia.   All other systems reviewed and are negative.  Blood pressure 96/73, pulse 72, temperature 98 F (36.7 C), temperature source Oral, resp. rate 16, height 4\' 11"  (1.499 m), weight 63 kg, SpO2 99%. Body mass index is 28.05 kg/m.  Treatment Plan Summary: Daily contact with patient to assess and evaluate symptoms and progress in treatment and Medication management.   Will continue to monitor side pain, patient is reporting patient was sensitive  to light touch on the area. At this time it may be 2/2 to patient constantly lying on that side. Patient not having urinary concerns currently.    Still waiting on safe disposition as patient would not be able to live independently given cognitive impairment. Recommend assisted living.    No medication side effects reported.  Because of urinary incontinence, recommended adult diapers to be worn.   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  Plan:  -Continue Imitrex 25 mg PRN BID for migraines -Continue Colace 100 mg daily for constipation -Continue Vitamin D 50.000 units weekly for bone health. -Continue Sertraline150 mg po Q daily for depression/anxiety -Continue Buspar 15 mg po bid for anxiety.  -Continue paliperidone 6 mg po qd for mood stabilization -Continue Trazodone 50 mg nightly for insomnia  -Continue Nicoderm 21 mg topically Q 24 hrs for nicotine withdrawal management. -Continue vitamin B12 1000 mcg IM weekly for 4 weeks then monthly -Continue Melatonin 5 mg nightly for sleep -Continue Protonix EC 40 mg p.o. daily for GERD -Continue MiraLAX 17 g p.o. PRN for constipation -Continue hydroxyzine 25  mg p.o. 3 times daily as needed for anxiety -Continue Ensure nutritional shakes TID in between meals  -Previously discontinued Hydroxyzine 50 mg nightly-hypotension in the mornings   Safety and Monitoring: Voluntary admission to inpatient psychiatric unit for safety, stabilization and treatment Daily contact with patient to assess and evaluate symptoms and progress in treatment Patient's case to be discussed in multi-disciplinary team meeting Observation Level : q15 minute checks Vital signs: q12 hours Precautions: Safety   Discharge  Planning: Social work and case management to assist with discharge planning and identification of hospital follow-up needs prior to discharge Estimated LOS: Unknown at this time. Discharge Concerns: Need to establish a safety plan; Medication compliance and effectiveness Discharge Goals: Return home with outpatient referrals for mental health follow-up including medication management/psychotherapy No legal guardian, has payee Total Time Spent in Direct Patient Care:   I personally spent 25. minutes on the unit in direct patient care. The direct patient care time included face-to-face time with the patient, reviewing the patient's chart, communicating with other professionals, and coordinating care. Greater than 50% of this time was spent in counseling or coordinating care with the patient regarding goals of hospitalization, psycho-education, and discharge planning needs.   [name], MD,  04/30/2023, 12:19 PM Patient ID: Larey Brick, female   DOB: 1975/07/25, 47 y.o.   MRN: [id number]

## 2023-04-30 NOTE — Group Note (Signed)
LCSW Group Therapy Note  Group Date: 04/30/2023 Start Time: 1100 End Time: 1145   Type of Therapy and Topic:  Group Therapy - How To Cope with Nervousness about Discharge   Participation Level:  Did Not Attend   Description of Group This process group involved identification of patients' feelings about discharge. Some of them are scheduled to be discharged soon, while others are new admissions, but each of them was asked to share thoughts and feelings surrounding discharge from the hospital. One common theme was that they are excited at the prospect of going home, while another was that many of them are apprehensive about sharing why they were hospitalized. Patients were given the opportunity to discuss these feelings with their peers in preparation for discharge.  Therapeutic Goals  Patient will identify their overall feelings about pending discharge. Patient will think about how they might proactively address issues that they believe will once again arise once they get home (i.e. with parents). Patients will participate in discussion about having hope for change.    Therapeutic Modalities Cognitive Behavioral Therapy   Ane Payment, LCSW 04/30/2023  2:35 PM

## 2023-04-30 NOTE — Progress Notes (Signed)
   04/30/23 0400  Psych Admission Type (Psych Patients Only)  Admission Status Involuntary  Psychosocial Assessment  Patient Complaints Anxiety;Depression  Eye Contact Fair  Facial Expression Flat  Affect Sad  Speech Soft  Interaction Childlike  Motor Activity Slow  Appearance/Hygiene Unremarkable  Behavior Characteristics Cooperative;Appropriate to situation  Mood Anxious;Depressed  Thought Process  Coherency WDL  Content WDL  Delusions None reported or observed  Perception WDL  Hallucination None reported or observed  Judgment Impaired  Confusion None  Danger to Self  Current suicidal ideation? Denies  Agreement Not to Harm Self Yes  Description of Agreement verbal

## 2023-04-30 NOTE — [facility] Group Notes (Signed)
[facility] Group Notes:  (Nursing/MHT/Case Management/Adjunct)  Date:  04/30/2023  Time:  9:21 PM  Type of Therapy:  Psychoeducational Skills  Participation Level:  Minimal  Participation Quality:  Resistant  Affect:  Flat  Cognitive:  Lacking  Insight:  Lacking  Engagement in Group:  Limited  Modes of Intervention:  Education  Summary of Progress/Problems: The patient rated her day as a 6 out of a possible 10 since she had a good day. She offered no additional details. Her goal for tomorrow is to try and have another good day.   Hazle Coca S 04/30/2023, 9:21 PM

## 2023-04-30 NOTE — Progress Notes (Signed)
   04/30/23 1000  Psych Admission Type (Psych Patients Only)  Admission Status Involuntary  Psychosocial Assessment  Patient Complaints Anxiety;Depression  Eye Contact Fair  Facial Expression Flat  Affect Sad  Speech Soft  Interaction Childlike  Motor Activity Slow  Appearance/Hygiene Unremarkable  Behavior Characteristics Cooperative;Appropriate to situation  Mood Anxious;Depressed  Thought Process  Coherency WDL  Content WDL  Delusions None reported or observed  Perception WDL  Hallucination None reported or observed  Judgment Impaired  Confusion None  Danger to Self  Current suicidal ideation? Denies  Self-Injurious Behavior No self-injurious ideation or behavior indicators observed or expressed   Agreement Not to Harm Self Yes  Description of Agreement Verbal  Danger to Others  Danger to Others None reported or observed

## 2023-04-30 NOTE — Plan of Care (Signed)
  Problem: Activity: Goal: Interest or engagement in leisure activities will improve Outcome: Progressing Goal: Imbalance in normal sleep/wake cycle will improve Outcome: Progressing   

## 2023-04-30 NOTE — Plan of Care (Signed)
  Problem: Education: Goal: Knowledge of the prescribed therapeutic regimen will improve Outcome: Progressing   Problem: Activity: Goal: Interest or engagement in leisure activities will improve Outcome: Progressing

## 2023-04-30 NOTE — Group Note (Signed)
Recreation Therapy Group Note   Group Topic:Animal Assisted Therapy   Group Date: 04/30/2023 Start Time: 1005 End Time: 1040 Facilitators: Marabelle Cushman-McCall, LRT,CTRS Location: [address] Dayroom   Animal-Assisted Activity (AAA) Program Checklist/Progress Notes Patient Eligibility Criteria Checklist & Daily Group note for Rec Tx Intervention  AAA/T Program Assumption of Risk Form signed by Patient/ or Parent Legal Guardian Yes  Patient understands his/her participation is voluntary Yes  Education: Charity fundraiser, Appropriate Animal Interaction   Education Outcome: Acknowledges education.    Affect/Mood: N/A   Participation Level: Did not attend    Clinical Observations/Individualized Feedback:    Plan: Continue to engage patient in RT group sessions 2-3x/week.   Christyna Letendre-McCall, LRT,CTRS 04/30/2023 12:26 PM

## 2023-05-01 DIAGNOSIS — F251 Schizoaffective disorder, depressive type: Secondary | ICD-10-CM | POA: Diagnosis not present

## 2023-05-01 NOTE — Group Note (Signed)
Date:  05/01/2023 Time:  11:06 PM  Group Topic/Focus:  Narcotics Anonymous (NA) Meeting    Participation Level:  Did Not Attend  Participation Quality:   N/A  Affect:   N/A  Cognitive:   N/A  Insight: None  Engagement in Group:   N/A  Modes of Intervention:   N/A  Additional Comments:  Patient did not attend NA Meeting.   Kennieth Francois 05/01/2023, 11:06 PM

## 2023-05-01 NOTE — BHH Group Notes (Signed)

## 2023-05-01 NOTE — Plan of Care (Signed)
  Problem: Coping: Goal: Coping ability will improve Outcome: Progressing Goal: Will verbalize feelings Outcome: Progressing   Problem: Self-Concept: Goal: Level of anxiety will decrease Outcome: Progressing   Problem: Education: Goal: Emotional status will improve Outcome: Progressing   Problem: Education: Goal: Mental status will improve Outcome: Progressing   Problem: Physical Regulation: Goal: Ability to maintain clinical measurements within normal limits will improve Outcome: Progressing   Problem: Safety: Goal: Periods of time without injury will increase Outcome: Progressing

## 2023-05-01 NOTE — [facility] Group Notes (Signed)
[facility] Group Notes:  (Nursing/MHT/Case Management/Adjunct)  Date:  05/01/2023  Time:  9:10 AM  Type of Therapy:  Group Topic/ Focus: Goals Group: The focus of this group is to help patients establish daily goals to achieve during treatment and discuss how the patient can incorporate goal setting into their daily lives to aide in recovery.   Participation Level:  Did Not Attend  Summary of Progress/Problems:  Patient did not attend goals group today. Patient was encouraged but refused.   Yolanda [name] 05/01/2023, 9:10 AM

## 2023-05-01 NOTE — Progress Notes (Signed)
[facility] MD Progress Note  05/01/2023 9:54 AM Yolanda [name]  MRN:  [id number]  Principal Problem: Schizoaffective disorder, depressive type (HCC)  Diagnosis: Principal Problem:   Schizoaffective disorder, depressive type (HCC) Active Problems:   GERD (gastroesophageal reflux disease)   Intellectual disability   Tobacco use disorder  Reason for admission: This is the first psychiatric admission in this [facility] in 12 years for this 66 AA female with an extensive hx of mental illnesses & probable polysubstance use disorders. She is admitted to the [facility] from the [facility] hospital with complain of worsening suicidal ideations with plan to stab herself. Per chart review, patient apparently reported at the ED that she has been depressed for a while & has not been taking her mental health medications. After medical evaluation.clearance, she was transferred to the [facility] for further psychiatric evaluation/treatments.   Patient assessment note: Staff reports that the patient remains unchanged.  She is somewhat depressed about the delay in her placement.  She is compliant with treatment.  She slept 8.25 hours.  She is attending groups and is preparing for her GED.   When seen today patient was lying in bed and remains somewhat withdrawn but alert oriented and cooperative.  She reports that she is looking forward to taking the GED exam.  She also is aware that tomorrow is her birthday.  She is contracting for safety.  No active SI/HI/AVH noted.  Total Time spent with patient:  20 minutes  Past Psychiatric History:  See H & P  Past Medical History:  Past Medical History:  Diagnosis Date   Bipolar affect, depressed (HCC)    Constipation 08/17/2022   Depression    Falls 07/21/2022   Fracture of femoral neck, right, closed (HCC) 01/17/2022   Herpes simplex 08/22/2017   Open fracture dislocation of right elbow joint 01/17/2022    Past Surgical History:  Procedure Laterality Date   NO PAST SURGERIES      SALPINGECTOMY     Family History: History reviewed. No pertinent family history.  Family Psychiatric  History: See H & P  Social History:  Social History   Substance and Sexual Activity  Alcohol Use Yes     Social History   Substance and Sexual Activity  Drug Use Yes   Types: Cocaine, Marijuana    Social History   Socioeconomic History   Marital status: Single    Spouse name: Not on file   Number of children: Not on file   Years of education: Not on file   Highest education level: Not on file  Occupational History   Not on file  Tobacco Use   Smoking status: Every Day   Smokeless tobacco: Not on file  Substance and Sexual Activity   Alcohol use: Yes   Drug use: Yes    Types: Cocaine, Marijuana   Sexual activity: Yes  Other Topics Concern   Not on file  Social History Narrative   Not on file   Social Determinants of Health   Financial Resource Strain: Low Risk  (09/12/2022)   Received from [facility]   Overall Financial Resource Strain (CARDIA)    Difficulty of Paying Living Expenses: Not hard at all  Food Insecurity: Patient Declined (12/20/2022)   Hunger Vital Sign    Worried About Running Out of Food in the Last Year: Patient declined    Ran Out of Food in the Last Year: Patient declined  Transportation Needs: No Transportation Needs (12/20/2022)   PRAPARE -  Administrator, Civil Service (Medical): No    Lack of Transportation (Non-Medical): No  Physical Activity: Not on file  Stress: No Stress Concern Present (07/17/2022)   Received from [facility], [facility] - Occupational Stress Questionnaire    Feeling of Stress : Not at all  Social Connections: Unknown (07/16/2022)   Received from [facility]   Social Network    Social Network: Not on file   Sleep: Good  Appetite:  Good  Current Medications: Current Facility-Administered Medications  Medication Dose  Route Frequency Provider Last Rate Last Admin   acetaminophen (TYLENOL) tablet 650 mg  650 mg Oral Q6H PRN [name], NP   650 mg at 04/29/23 1145   alum & mag hydroxide-simeth (MAALOX/MYLANTA) 200-200-20 MG/5ML suspension 30 mL  30 mL Oral Q4H PRN [name], NP   30 mL at 01/28/23 1530   busPIRone (BUSPAR) tablet 15 mg  15 mg Oral BID [name], NP   15 mg at 05/01/23 1601   cyanocobalamin (VITAMIN B12) injection 1,000 mcg  1,000 mcg Intramuscular Q30 days Sarita Bottom, MD   1,000 mcg at 04/03/23 1237   diphenhydrAMINE (BENADRYL) capsule 50 mg  50 mg Oral TID PRN [name], NP   50 mg at 01/15/23 1211   Or   diphenhydrAMINE (BENADRYL) injection 50 mg  50 mg Intramuscular TID PRN [name], NP       docusate sodium (COLACE) capsule 100 mg  100 mg Oral Daily [name], NP   100 mg at 05/01/23 0932   feeding supplement (ENSURE ENLIVE / ENSURE PLUS) liquid 237 mL  237 mL Oral TID BM Nkwenti, Tyler Aas, NP   237 mL at 04/30/23 2014   haloperidol (HALDOL) tablet 5 mg  5 mg Oral TID PRN [name], NP   5 mg at 01/15/23 1211   Or   haloperidol lactate (HALDOL) injection 5 mg  5 mg Intramuscular TID PRN [name], NP       hydrOXYzine (ATARAX) tablet 25 mg  25 mg Oral TID PRN [name], DO   25 mg at 04/30/23 2015   LORazepam (ATIVAN) tablet 2 mg  2 mg Oral TID PRN [name], NP   2 mg at 01/15/23 1211   Or   LORazepam (ATIVAN) injection 2 mg  2 mg Intramuscular TID PRN [name], NP       magnesium hydroxide (MILK OF MAGNESIA) suspension 30 mL  30 mL Oral Daily PRN [name], NP   30 mL at 04/13/23 0814   melatonin tablet 5 mg  5 mg Oral QHS Nkwenti, Doris, NP   5 mg at 04/30/23 2015   nicotine (NICODERM CQ - dosed in mg/24 hours) patch 21 mg  21 mg Transdermal Daily [name], DO   21 mg at 03/25/23 3557   nicotine polacrilex (NICORETTE) gum 2 mg  2 mg Oral PRN [name], MD   2 mg at 04/30/23 1652   paliperidone (INVEGA) 24 hr tablet 6 mg  6  mg Oral Daily [name], NP   6 mg at 04/30/23 2015   pantoprazole (PROTONIX) EC tablet 40 mg  40 mg Oral Daily [name], NP   40 mg at 05/01/23 0811   polyethylene glycol (MIRALAX / GLYCOLAX) packet 17 g  17 g Oral Daily PRN [name], NP   17 g at 04/13/23 1734   sertraline (ZOLOFT) tablet  150 mg  150 mg Oral Daily Massengill, Nathan, MD   150 mg at 05/01/23 7829   SUMAtriptan (IMITREX) tablet 25 mg  25 mg Oral BID PRN [name], NP   25 mg at 04/29/23 1714   traZODone (DESYREL) tablet 50 mg  50 mg Oral QHS Nkwenti, Doris, NP   50 mg at 04/30/23 2015   Vitamin D (Ergocalciferol) (DRISDOL) 1.25 MG (50000 UNIT) capsule 50,000 Units  50,000 Units Oral Q7 days [name], NP   50,000 Units at 04/27/23 1612   Lab Results:  No results found for this or any previous visit (from the past 48 hour(s)).      Blood Alcohol level:  Lab Results  Component Value Date   ETH <10 12/19/2022   ETH <10 11/21/2019   Metabolic Disorder Labs: Lab Results  Component Value Date   HGBA1C 4.4 (L) 12/21/2022   MPG 79.58 12/21/2022   No results found for: "PROLACTIN" Lab Results  Component Value Date   CHOL 218 (H) 12/21/2022   TRIG 81 12/21/2022   HDL 53 12/21/2022   CHOLHDL 4.1 12/21/2022   VLDL 16 12/21/2022   LDLCALC 149 (H) 12/21/2022   LDLCALC 110 (H) 03/13/2011   Physical Findings: AIMS: Facial and Oral Movements Muscles of Facial Expression: None Lips and Perioral Area: None Jaw: None Tongue: None,Extremity Movements Upper (arms, wrists, hands, fingers): None Lower (legs, knees, ankles, toes): None, Trunk Movements Neck, shoulders, hips: None, Global Judgements Severity of abnormal movements overall : None Incapacitation due to abnormal movements: None Patient's awareness of abnormal movements: No Awareness, Dental Status Current problems with teeth and/or dentures?: No Does patient usually wear dentures?: No  CIWA:    COWS:     AIMS:0 Musculoskeletal: Strength & Muscle Tone: within normal limits Gait & Station: normal Patient leans: N/A  Psychiatric Specialty Exam:  Presentation  General Appearance:  Casual  Eye Contact: Limited to baseline Speech: Decreased amount, decreased tone and volume but at baseline Speech Volume: Decreased  Handedness: Right  Mood and Affect  Mood: Sad and depressed mainly secondary to being in the hospital with no place to go Affect: Restricted  Thought Process  Thought Processes: Goal Directed  Descriptions of Associations:Intact  Orientation:Full (Time, Place and Person)  Thought Content:Logical [place] History of Schizophrenia/Schizoaffective disorder:Yes  Duration of Psychotic Symptoms:Greater than six months  Hallucinations:Hallucinations: None     Ideas of Reference: None  Suicidal Thoughts:Suicidal Thoughts: No     Homicidal Thoughts:Homicidal Thoughts: No    Sensorium  Memory: Immediate Fair; Recent Fair; Remote Fair  Judgment: Fair  Insight: Fair  Art therapist  Concentration: Fair  Attention Span: Fair  Recall: [employer] of Knowledge: Fair  Language: Fair  Psychomotor Activity  Psychomotor Activity: Psychomotor Activity: Normal     Assets  Assets: Communication Skills; Desire for Improvement  Sleep  Sleep: Sleep: Good Number of Hours of Sleep: 8.25     Physical Exam: Physical Exam Vitals and nursing note reviewed.  Cardiovascular:     Rate and Rhythm: Normal rate.  Pulmonary:     Effort: Pulmonary effort is normal.  Genitourinary:    Comments: Deferred Musculoskeletal:        General: Normal range of motion.     Cervical back: Normal range of motion.  Skin:    General: Skin is warm and dry.  Neurological:     General: No focal deficit present.     Mental Status: She is alert and oriented to person, place,  and time.  Psychiatric:        Mood and Affect: Mood normal.         Behavior: Behavior normal.        Thought Content: Thought content normal.    Review of Systems  Constitutional:  Negative for fever.  Respiratory:  Negative for cough, shortness of breath and wheezing.   Gastrointestinal:  Positive for abdominal pain and vomiting. Negative for constipation.  Genitourinary:        Pain to light touch on the R side  Neurological:  Negative for dizziness.  Psychiatric/Behavioral:  Negative for depression, hallucinations, memory loss, substance abuse and suicidal ideas. The patient is nervous/anxious and has insomnia.   All other systems reviewed and are negative.  Blood pressure 110/79, pulse 80, temperature 97.9 F (36.6 C), temperature source Oral, resp. rate 16, height 4\' 11"  (1.499 m), weight 63 kg, SpO2 97%. Body mass index is 28.05 kg/m.  Treatment Plan Summary: Daily contact with patient to assess and evaluate symptoms and progress in treatment and Medication management.   Will continue to monitor side pain, patient is reporting patient was sensitive to light touch on the area. At this time it may be 2/2 to patient constantly lying on that side. Patient not having urinary concerns currently.    Still waiting on safe disposition as patient would not be able to live independently given cognitive impairment. Recommend assisted living.    No medication side effects reported.  Because of urinary incontinence, recommended adult diapers to be worn.   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  Plan:  -Continue Imitrex 25 mg PRN BID for migraines -Continue Colace 100 mg daily for constipation -Continue Vitamin D 50.000 units weekly for bone health. -Continue Sertraline150 mg po Q daily for  depression/anxiety -Continue Buspar 15 mg po bid for anxiety.  -Continue paliperidone 6 mg po qd for mood stabilization -Continue Trazodone 50 mg nightly for insomnia  -Continue Nicoderm 21 mg topically Q 24 hrs for nicotine withdrawal management. -Continue vitamin B12 1000 mcg IM weekly for 4 weeks then monthly -Continue Melatonin 5 mg nightly for sleep -Continue Protonix EC 40 mg p.o. daily for GERD -Continue MiraLAX 17 g p.o. PRN for constipation -Continue hydroxyzine 25 mg p.o. 3 times daily as needed for anxiety -Continue Ensure nutritional shakes TID in between meals  -Previously discontinued Hydroxyzine 50 mg nightly-hypotension in the mornings   Safety and Monitoring: Voluntary admission to inpatient psychiatric unit for safety, stabilization and treatment Daily contact with patient to assess and evaluate symptoms and progress in treatment Patient's case to be discussed in multi-disciplinary team meeting Observation Level : q15 minute checks Vital signs: q12 hours Precautions: Safety   Discharge Planning: Social work and case management to assist with discharge planning and identification of hospital follow-up needs prior to discharge Estimated LOS: Unknown at this time. Discharge Concerns: Need to establish a safety plan; Medication compliance and effectiveness Discharge Goals: Return home with outpatient referrals for mental health follow-up including medication management/psychotherapy No legal guardian, has payee Total Time Spent in Direct Patient Care:   I personally spent 20. minutes on the unit in direct patient care. The direct patient care time included face-to-face time with the patient, reviewing the patient's chart, communicating with other professionals, and coordinating care. Greater than 50% of this time was spent in counseling or  coordinating care with the patient regarding goals of hospitalization, psycho-education, and discharge planning needs.   [name], MD,  05/01/2023, 9:54 AM Patient ID: Yolanda [name], female   DOB: 02-26-1976, 47 y.o.   MRN: [id number]   Patient ID: Yolanda [name], female   DOB: [birth year], 47 y.o.   MRN: [id number]

## 2023-05-01 NOTE — Progress Notes (Signed)
   05/01/23 2313  Psych Admission Type (Psych Patients Only)  Admission Status Involuntary  Psychosocial Assessment  Patient Complaints Anxiety;Depression  Eye Contact Fair  Facial Expression Flat  Affect Anxious;Depressed;Sad  Speech Soft  Interaction Childlike  Motor Activity Slow  Appearance/Hygiene Unremarkable  Behavior Characteristics Cooperative;Appropriate to situation  Mood Anxious;Depressed  Thought Process  Coherency WDL  Content WDL  Delusions None reported or observed  Perception WDL  Hallucination None reported or observed  Judgment Impaired  Confusion None  Danger to Self  Current suicidal ideation? Denies  Agreement Not to Harm Self Yes  Description of Agreement verbal  Danger to Others  Danger to Others None reported or observed

## 2023-05-01 NOTE — Progress Notes (Signed)
   05/01/23 0800  Psych Admission Type (Psych Patients Only)  Admission Status Involuntary  Psychosocial Assessment  Patient Complaints Anxiety;Depression  Eye Contact Fair  Facial Expression Flat  Affect Anxious;Depressed  Speech Soft  Interaction Childlike  Motor Activity Slow  Appearance/Hygiene Unremarkable  Behavior Characteristics Appropriate to situation  Mood Depressed;Anxious  Thought Process  Coherency WDL  Content WDL  Delusions None reported or observed  Perception WDL  Hallucination None reported or observed  Judgment Impaired  Confusion None  Danger to Self  Current suicidal ideation? Denies  Agreement Not to Harm Self Yes  Description of Agreement Verbal  Danger to Others  Danger to Others None reported or observed

## 2023-05-01 NOTE — Plan of Care (Signed)
  Problem: Education: Goal: Utilization of techniques to improve thought processes will improve Outcome: Progressing Goal: Knowledge of the prescribed therapeutic regimen will improve Outcome: Progressing   Problem: Activity: Goal: Interest or engagement in leisure activities will improve Outcome: Progressing Goal: Imbalance in normal sleep/wake cycle will improve Outcome: Progressing   

## 2023-05-01 NOTE — Progress Notes (Signed)
   05/01/23 0100  Psych Admission Type (Psych Patients Only)  Admission Status Involuntary  Psychosocial Assessment  Patient Complaints Anxiety;Depression  Eye Contact Fair  Facial Expression Flat  Affect Sad  Speech Soft  Interaction Childlike  Motor Activity Slow  Appearance/Hygiene Unremarkable  Behavior Characteristics Cooperative;Appropriate to situation  Mood Anxious;Depressed  Thought Process  Coherency WDL  Content WDL  Delusions None reported or observed  Perception WDL  Hallucination None reported or observed  Judgment Impaired  Confusion None  Danger to Self  Current suicidal ideation? Denies  Self-Injurious Behavior No self-injurious ideation or behavior indicators observed or expressed   Agreement Not to Harm Self Yes  Description of Agreement verbal

## 2023-05-02 DIAGNOSIS — F251 Schizoaffective disorder, depressive type: Secondary | ICD-10-CM | POA: Diagnosis not present

## 2023-05-02 NOTE — [facility] Group Notes (Signed)
[facility] Group Notes:  (Nursing/MHT/Case Management/Adjunct)  Date:  05/02/2023  Time:  9:31 PM  Type of Therapy:  Psychoeducational Skills  Participation Level:  Active  Participation Quality:  Appropriate  Affect:  Appropriate  Cognitive:  Appropriate  Insight:  Improving  Engagement in Group:  Improving  Modes of Intervention:  Education  Summary of Progress/Problems: The patient rated her day as a 10 out of 10. She shared with the group that she celebrated her birthday today and was very pleased. Her goal for tomorrow is to work on her GED.   Hazle Coca S 05/02/2023, 9:31 PM

## 2023-05-02 NOTE — Progress Notes (Signed)
D: Patient is alert, oriented, pleasant, and cooperative. Denies SI, HI, AVH, and verbally contracts for safety.   A: Scheduled medications administered per MD order. PRN nicotine gum administered. Support provided. Patient educated on safety on the unit and medications. Routine safety checks every 15 minutes. Patient stated understanding to tell nurse about any new physical symptoms. Patient understands to tell staff of any needs.     R: No adverse drug reactions noted. Patient remains safe at this time and will continue to monitor.    05/02/23 0800  Psych Admission Type (Psych Patients Only)  Admission Status Involuntary  Psychosocial Assessment  Patient Complaints Anxiety;Depression  Eye Contact Fair  Facial Expression Flat  Affect Depressed;Sad  Speech Soft  Interaction Childlike  Motor Activity Slow  Appearance/Hygiene Unremarkable  Behavior Characteristics Cooperative;Appropriate to situation  Mood Depressed;Anxious  Thought Process  Coherency WDL  Content WDL  Delusions None reported or observed  Perception WDL  Hallucination None reported or observed  Judgment Impaired  Confusion None  Danger to Self  Current suicidal ideation? Denies  Self-Injurious Behavior No self-injurious ideation or behavior indicators observed or expressed   Agreement Not to Harm Self Yes  Description of Agreement verbal  Danger to Others  Danger to Others None reported or observed

## 2023-05-02 NOTE — Plan of Care (Signed)
  Problem: Education: Goal: Utilization of techniques to improve thought processes will improve Outcome: Progressing Goal: Knowledge of the prescribed therapeutic regimen will improve Outcome: Progressing   Problem: Activity: Goal: Interest or engagement in leisure activities will improve Outcome: Not Progressing Goal: Imbalance in normal sleep/wake cycle will improve Outcome: Progressing

## 2023-05-02 NOTE — Progress Notes (Signed)
CSW met with pt and participants from DSS ([employer] Jaegers), [employer] Sue Lush. Members of [facility] [place] and [place] were also on [employer]. Pt was present during the meeting .  Discussed types of Insurance. CSW was joined by MD Dr. Enedina Finner who provided insight concerning pt's current mental state and expressed his concerns about the pt decompensating due to her environment. CSW placed emphasis on the need for placement and discussed potential placements dates. Pt was made aware of her guardianship hearing being continued due to her not ever being "served" the continuance will occur in [facility] on May 21, 2023. CSW has scheduled a follow-up meeting to occur on 05-10-2023 @ 1130 am (Virtually) via Teams. CSW will continue to monitor.

## 2023-05-02 NOTE — Progress Notes (Signed)
Chaplain met with [name] and another patient and offered prayer at their request.  Kirsta is feeling anxious, but also excited about her new home.  She does not want to go to [place] and told the other patient that.  Chaplain provided listening and emotional support as she shared about her feelings.   [address] Bcc Pager, [phone]

## 2023-05-02 NOTE — Group Note (Signed)
LCSW Group Therapy Note   Group Date: 05/02/2023 Start Time: 1100 End Time: 1200   Type of Therapy and Topic:  Group Therapy: Challenging Core Beliefs  Participation Level:  Minimal  Description of Group:  Patients were educated about core beliefs and asked to identify one harmful core belief that they have. Patients were asked to explore from where those beliefs originate. Patients were asked to discuss how those beliefs make them feel and the resulting behaviors of those beliefs. They were then be asked if those beliefs are true and, if so, what evidence they have to support them. Lastly, group members were challenged to replace those negative core beliefs with helpful beliefs.   Therapeutic Goals:   1. Patient will identify harmful core beliefs and explore the origins of such beliefs. 2. Patient will identify feelings and behaviors that result from those core beliefs. 3. Patient will discuss whether such beliefs are true. 4.  Patient will replace harmful core beliefs with helpful ones.  Summary of Patient Progress:  Pt attended group but got pulled at 11:30AM for a meeting. While in group, pt participated and was appropriate.   Therapeutic Modalities: Cognitive Behavioral Therapy; Solution-Focused Therapy   Kathi Der, LCSWA 05/02/2023  12:19 PM

## 2023-05-02 NOTE — Progress Notes (Signed)
[facility] MD Progress Note  05/02/2023 9:57 AM Yolanda [name]  MRN:  [id number]  Principal Problem: Schizoaffective disorder, depressive type (HCC)  Diagnosis: Principal Problem:   Schizoaffective disorder, depressive type (HCC) Active Problems:   GERD (gastroesophageal reflux disease)   Intellectual disability   Tobacco use disorder  Reason for admission: This is the first psychiatric admission in this [facility] in 12 years for this 47 AA female with an extensive hx of mental illnesses & probable polysubstance use disorders. She is admitted to the [facility] from the [facility] hospital with complain of worsening suicidal ideations with plan to stab herself. Per chart review, patient apparently reported at the ED that she has been depressed for a while & has not been taking her mental health medications. After medical evaluation.clearance, she was transferred to the [facility] for further psychiatric evaluation/treatments.   Patient assessment note: Staff reports no change in patient's behavior.  No PRNs are given.  She slept 8.5 hours.  She is attending groups and continues to prepare for her GED.  Yesterday and no changes in medications were made.  Today is the patient's birthday and she responded appropriately.  She is alert oriented and cooperative.  She continues to read and prepare for the GED exam and is planning to do a trial of Adderall before she takes the GED exam.  In addition she is aware that at 11:30 AM today her caseworker at the [facility] will be discussing possible placement. She denies any active SI/HI/AVH. Total Time spent with patient:  20 minutes  Past Psychiatric History:  See H & P  Past Medical History:  Past Medical History:  Diagnosis Date   Bipolar affect, depressed (HCC)    Constipation 08/17/2022   Depression    Falls 07/21/2022   Fracture of femoral neck, right, closed (HCC) 01/17/2022   Herpes simplex 08/22/2017   Open fracture dislocation of right elbow joint 01/17/2022     Past Surgical History:  Procedure Laterality Date   NO PAST SURGERIES     SALPINGECTOMY     Family History: History reviewed. No pertinent family history.  Family Psychiatric  History: See H & P  Social History:  Social History   Substance and Sexual Activity  Alcohol Use Yes     Social History   Substance and Sexual Activity  Drug Use Yes   Types: Cocaine, Marijuana    Social History   Socioeconomic History   Marital status: Single    Spouse name: Not on file   Number of children: Not on file   Years of education: Not on file   Highest education level: Not on file  Occupational History   Not on file  Tobacco Use   Smoking status: Every Day   Smokeless tobacco: Not on file  Substance and Sexual Activity   Alcohol use: Yes   Drug use: Yes    Types: Cocaine, Marijuana   Sexual activity: Yes  Other Topics Concern   Not on file  Social History Narrative   Not on file   Social Determinants of Health   Financial Resource Strain: Low Risk  (09/12/2022)   Received from [facility], [facility]   Overall Financial Resource Strain (CARDIA)    Difficulty of Paying Living Expenses: Not hard at all  Food Insecurity: Patient Declined (12/20/2022)   Hunger Vital Sign    Worried About Running Out of Food in the Last Year: Patient declined    Ran Out of Food in the Last  Year: Patient declined  Transportation Needs: No Transportation Needs (12/20/2022)   PRAPARE - Administrator, Civil Service (Medical): No    Lack of Transportation (Non-Medical): No  Physical Activity: Not on file  Stress: No Stress Concern Present (07/17/2022)   Received from [facility], [facility] - Occupational Stress Questionnaire    Feeling of Stress : Not at all  Social Connections: Unknown (07/16/2022)   Received from [facility], [facility]   Social Network    Social Network: Not on file   Sleep: Good  Appetite:   Good  Current Medications: Current Facility-Administered Medications  Medication Dose Route Frequency Provider Last Rate Last Admin   acetaminophen (TYLENOL) tablet 650 mg  650 mg Oral Q6H PRN [name], NP   650 mg at 05/01/23 2054   alum & mag hydroxide-simeth (MAALOX/MYLANTA) 200-200-20 MG/5ML suspension 30 mL  30 mL Oral Q4H PRN [name], NP   30 mL at 01/28/23 1530   busPIRone (BUSPAR) tablet 15 mg  15 mg Oral BID [name], NP   15 mg at 05/02/23 0735   cyanocobalamin (VITAMIN B12) injection 1,000 mcg  1,000 mcg Intramuscular Q30 days Sarita Bottom, MD   1,000 mcg at 04/03/23 1237   diphenhydrAMINE (BENADRYL) capsule 50 mg  50 mg Oral TID PRN [name], NP   50 mg at 01/15/23 1211   Or   diphenhydrAMINE (BENADRYL) injection 50 mg  50 mg Intramuscular TID PRN [name], NP       docusate sodium (COLACE) capsule 100 mg  100 mg Oral Daily Nkwenti, Tyler Aas, NP   100 mg at 05/02/23 0735   feeding supplement (ENSURE ENLIVE / ENSURE PLUS) liquid 237 mL  237 mL Oral TID BM Nkwenti, Tyler Aas, NP   237 mL at 05/01/23 2053   haloperidol (HALDOL) tablet 5 mg  5 mg Oral TID PRN [name], NP   5 mg at 01/15/23 1211   Or   haloperidol lactate (HALDOL) injection 5 mg  5 mg Intramuscular TID PRN [name], NP       hydrOXYzine (ATARAX) tablet 25 mg  25 mg Oral TID PRN [name], DO   25 mg at 05/01/23 2053   LORazepam (ATIVAN) tablet 2 mg  2 mg Oral TID PRN [name], NP   2 mg at 01/15/23 1211   Or   LORazepam (ATIVAN) injection 2 mg  2 mg Intramuscular TID PRN [name], NP       magnesium hydroxide (MILK OF MAGNESIA) suspension 30 mL  30 mL Oral Daily PRN [name], NP   30 mL at 04/13/23 0814   melatonin tablet 5 mg  5 mg Oral QHS Nkwenti, Doris, NP   5 mg at 05/01/23 2053   nicotine (NICODERM CQ - dosed in mg/24 hours) patch 21 mg  21 mg Transdermal Daily [name], DO   21 mg at 03/25/23 0109   nicotine polacrilex (NICORETTE) gum 2 mg  2 mg Oral PRN  [name], MD   2 mg at 05/01/23 1610   paliperidone (INVEGA) 24 hr tablet 6 mg  6 mg Oral Daily [name], NP   6 mg at 05/01/23 2053   pantoprazole (PROTONIX) EC tablet 40 mg  40 mg Oral Daily [name], NP   40 mg at 05/02/23 0735   polyethylene glycol (MIRALAX / GLYCOLAX) packet 17 g  17 g Oral Daily PRN Nkwenti,  Doris, NP   17 g at 04/13/23 1734   sertraline (ZOLOFT) tablet 150 mg  150 mg Oral Daily Massengill, Nathan, MD   150 mg at 05/02/23 0736   SUMAtriptan (IMITREX) tablet 25 mg  25 mg Oral BID PRN [name], NP   25 mg at 04/29/23 1714   traZODone (DESYREL) tablet 50 mg  50 mg Oral QHS Nkwenti, Doris, NP   50 mg at 05/01/23 2053   Vitamin D (Ergocalciferol) (DRISDOL) 1.25 MG (50000 UNIT) capsule 50,000 Units  50,000 Units Oral Q7 days [name], NP   50,000 Units at 04/27/23 1612   Lab Results:  No results found for this or any previous visit (from the past 48 hour(s)).      Blood Alcohol level:  Lab Results  Component Value Date   ETH <10 12/19/2022   ETH <10 11/21/2019   Metabolic Disorder Labs: Lab Results  Component Value Date   HGBA1C 4.4 (L) 12/21/2022   MPG 79.58 12/21/2022   No results found for: "PROLACTIN" Lab Results  Component Value Date   CHOL 218 (H) 12/21/2022   TRIG 81 12/21/2022   HDL 53 12/21/2022   CHOLHDL 4.1 12/21/2022   VLDL 16 12/21/2022   LDLCALC 149 (H) 12/21/2022   LDLCALC 110 (H) 03/13/2011   Physical Findings: AIMS: Facial and Oral Movements Muscles of Facial Expression: None Lips and Perioral Area: None Jaw: None Tongue: None,Extremity Movements Upper (arms, wrists, hands, fingers): None Lower (legs, knees, ankles, toes): None, Trunk Movements Neck, shoulders, hips: None, Global Judgements Severity of abnormal movements overall : None Incapacitation due to abnormal movements: None Patient's awareness of abnormal movements: No Awareness, Dental Status Current problems with teeth and/or dentures?:  No Does patient usually wear dentures?: No  CIWA:    COWS:    AIMS:0 Musculoskeletal: Strength & Muscle Tone: within normal limits Gait & Station: normal Patient leans: N/A  Psychiatric Specialty Exam:  Presentation  General Appearance:  Casual  Eye Contact: Limited to baseline Speech: Decreased amount, decreased tone and volume but at baseline Speech Volume: Decreased  Handedness: Right  Mood and Affect  Mood: Sad and depressed mainly secondary to being in the hospital with no place to go Affect: Restricted  Thought Process  Thought Processes: Coherent  Descriptions of Associations:Intact  Orientation:Full (Time, Place and Person)  Thought Content:Logical [place] History of Schizophrenia/Schizoaffective disorder:Yes  Duration of Psychotic Symptoms:Greater than six months  Hallucinations:Hallucinations: None     Ideas of Reference: None  Suicidal Thoughts:Suicidal Thoughts: No     Homicidal Thoughts:Homicidal Thoughts: No    Sensorium  Memory: Immediate Fair; Remote Fair; Recent Fair  Judgment: Fair  Insight: Fair  Art therapist  Concentration: Fair  Attention Span: Fair  Recall: [employer] of Knowledge: Fair  Language: Fair  Psychomotor Activity  Psychomotor Activity: Psychomotor Activity: Normal     Assets  Assets: Communication Skills; Desire for Improvement  Sleep  Sleep: Sleep: Good Number of Hours of Sleep: 8.5     Physical Exam: Physical Exam Vitals and nursing note reviewed.  Cardiovascular:     Rate and Rhythm: Normal rate.  Pulmonary:     Effort: Pulmonary effort is normal.  Genitourinary:    Comments: Deferred Musculoskeletal:        General: Normal range of motion.     Cervical back: Normal range of motion.  Skin:    General: Skin is warm and dry.  Neurological:     General: No focal deficit present.  Mental Status: She is alert and oriented to person, place, and time.   Psychiatric:        Mood and Affect: Mood normal.        Behavior: Behavior normal.        Thought Content: Thought content normal.    Review of Systems  Constitutional:  Negative for fever.  Respiratory:  Negative for cough, shortness of breath and wheezing.   Gastrointestinal:  Positive for abdominal pain and vomiting. Negative for constipation.  Genitourinary:        Pain to light touch on the R side  Neurological:  Negative for dizziness.  Psychiatric/Behavioral:  Negative for depression, hallucinations, memory loss, substance abuse and suicidal ideas. The patient is nervous/anxious and has insomnia.   All other systems reviewed and are negative.  Blood pressure (!) 85/70, pulse (!) 110, temperature 97.8 F (36.6 C), temperature source Oral, resp. rate 16, height 4\' 11"  (1.499 m), weight 63 kg, SpO2 99%. Body mass index is 28.05 kg/m.  Treatment Plan Summary: Daily contact with patient to assess and evaluate symptoms and progress in treatment and Medication management.   Will continue to monitor side pain, patient is reporting patient was sensitive to light touch on the area. At this time it may be 2/2 to patient constantly lying on that side. Patient not having urinary concerns currently.    Still waiting on safe disposition as patient would not be able to live independently given cognitive impairment. Recommend assisted living.  She has a meeting today on 05/02/2023 regarding her placement.   No medication side effects reported.  Because of urinary incontinence, recommended adult diapers to be worn.   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  Plan:  -Continue Imitrex 25 mg PRN BID for migraines -Continue Colace 100 mg daily for  constipation -Continue Vitamin D 50.000 units weekly for bone health. -Continue Sertraline150 mg po Q daily for depression/anxiety -Continue Buspar 15 mg po bid for anxiety.  -Continue paliperidone 6 mg po qd for mood stabilization -Continue Trazodone 50 mg nightly for insomnia  -Continue Nicoderm 21 mg topically Q 24 hrs for nicotine withdrawal management. -Continue vitamin B12 1000 mcg IM weekly for 4 weeks then monthly -Continue Melatonin 5 mg nightly for sleep -Continue Protonix EC 40 mg p.o. daily for GERD -Continue MiraLAX 17 g p.o. PRN for constipation -Continue hydroxyzine 25 mg p.o. 3 times daily as needed for anxiety -Continue Ensure nutritional shakes TID in between meals  -Previously discontinued Hydroxyzine 50 mg nightly-hypotension in the mornings   Safety and Monitoring: Voluntary admission to inpatient psychiatric unit for safety, stabilization and treatment Daily contact with patient to assess and evaluate symptoms and progress in treatment Patient's case to be discussed in multi-disciplinary team meeting Observation Level : q15 minute checks Vital signs: q12 hours Precautions: Safety   Discharge Planning: Social work and case management to assist with discharge planning and identification of hospital follow-up needs prior to discharge Estimated LOS: Unknown at this time. Discharge Concerns: Need to establish a safety plan; Medication compliance and effectiveness Discharge Goals: Return home with outpatient referrals for mental health follow-up including medication management/psychotherapy No legal guardian, has payee Total Time Spent in Direct Patient Care:   I personally spent 20. minutes on the unit in direct patient care. The direct patient care time included face-to-face time with the patient,  reviewing the patient's chart, communicating with other professionals, and coordinating care. Greater than 50% of this time was spent in counseling or coordinating care with  the patient regarding goals of hospitalization, psycho-education, and discharge planning needs.   [name], MD,  05/02/2023, 9:57 AM Patient ID: Yolanda [name], female   DOB: [birth year], 47 y.o.   MRN: [id number]   Patient ID: Yolanda [name], female   DOB: Feb 27, 1976, 47 y.o.   MRN: [id number] Patient ID: Yolanda [name], female   DOB: 1976/01/25, 47 y.o.   MRN: [id number]

## 2023-05-02 NOTE — Progress Notes (Signed)
Patient called this RN to her room to show me a red spot in her groin area. The area is moderate in size and slightly raised. No drainage/bleeding noted. Will notify on coming RN so doctor can assess tomorrow.

## 2023-05-03 ENCOUNTER — Encounter (HOSPITAL_COMMUNITY): Payer: Self-pay

## 2023-05-03 DIAGNOSIS — F251 Schizoaffective disorder, depressive type: Secondary | ICD-10-CM | POA: Diagnosis not present

## 2023-05-03 MED ORDER — HYDROCORTISONE 1 % EX CREA
TOPICAL_CREAM | Freq: Two times a day (BID) | CUTANEOUS | Status: AC
  Administered 2023-05-03 – 2023-05-07 (×6): 1 via TOPICAL
  Filled 2023-05-03 (×2): qty 28

## 2023-05-03 NOTE — Progress Notes (Signed)
[facility] MD Progress Note  05/03/2023 11:07 AM Yolanda [name]  MRN:  [id number]  Principal Problem: Schizoaffective disorder, depressive type (HCC)  Diagnosis: Principal Problem:   Schizoaffective disorder, depressive type (HCC) Active Problems:   GERD (gastroesophageal reflux disease)   Intellectual disability   Tobacco use disorder  Reason for admission: This is the first psychiatric admission in this [facility] in 12 years for this 47 AA female with an extensive hx of mental illnesses & probable polysubstance use disorders. She is admitted to the [facility] from the [facility] hospital with complain of worsening suicidal ideations with plan to stab herself. Per chart review, patient apparently reported at the ED that she has been depressed for a while & has not been taking her mental health medications. After medical evaluation.clearance, she was transferred to the [facility] for further psychiatric evaluation/treatments.   Patient assessment note: Staff reports no change in patient's behavior.  She had a birthday yesterday.  She also had a meeting with DSS and the LME partners.  According to the CSW  her guardianship hearing was being continued due to her not ever being "served", a follow-up meeting was scheduled for 05/10/2023 at 11:30 AM.  Patient slept 7.0 hours. She is attending groups and continues to prepare for her GED.  Yesterday and no changes in medications were made.  Today the patient was seen and evaluated and was noted to be alert oriented and cooperative and fairly pleasant.  She denies any active SI/HI/AVH and continues to await placement. Total Time spent with patient:  20 minutes  Past Psychiatric History:  See H & P  Past Medical History:  Past Medical History:  Diagnosis Date   Bipolar affect, depressed (HCC)    Constipation 08/17/2022   Depression    Falls 07/21/2022   Fracture of femoral neck, right, closed (HCC) 01/17/2022   Herpes simplex 08/22/2017   Open fracture  dislocation of right elbow joint 01/17/2022    Past Surgical History:  Procedure Laterality Date   NO PAST SURGERIES     SALPINGECTOMY     Family History: History reviewed. No pertinent family history.  Family Psychiatric  History: See H & P  Social History:  Social History   Substance and Sexual Activity  Alcohol Use Yes     Social History   Substance and Sexual Activity  Drug Use Yes   Types: Cocaine, Marijuana    Social History   Socioeconomic History   Marital status: Single    Spouse name: Not on file   Number of children: Not on file   Years of education: Not on file   Highest education level: Not on file  Occupational History   Not on file  Tobacco Use   Smoking status: Every Day   Smokeless tobacco: Not on file  Substance and Sexual Activity   Alcohol use: Yes   Drug use: Yes    Types: Cocaine, Marijuana   Sexual activity: Yes  Other Topics Concern   Not on file  Social History Narrative   Not on file   Social Determinants of Health   Financial Resource Strain: Low Risk  (09/12/2022)   Received from [facility], [facility]   Overall Financial Resource Strain (CARDIA)    Difficulty of Paying Living Expenses: Not hard at all  Food Insecurity: Patient Declined (12/20/2022)   Hunger Vital Sign    Worried About Running Out of Food in the Last Year: Patient declined    Barista  in the Last Year: Patient declined  Transportation Needs: No Transportation Needs (12/20/2022)   PRAPARE - Administrator, Civil Service (Medical): No    Lack of Transportation (Non-Medical): No  Physical Activity: Not on file  Stress: No Stress Concern Present (07/17/2022)   Received from [facility], [facility] of Occupational Health - Occupational Stress Questionnaire    Feeling of Stress : Not at all  Social Connections: Unknown (07/16/2022)   Received from [facility], [facility]   Social Network    Social Network: Not on  file   Sleep: Good  Appetite:  Good  Current Medications: Current Facility-Administered Medications  Medication Dose Route Frequency Provider Last Rate Last Admin   acetaminophen (TYLENOL) tablet 650 mg  650 mg Oral Q6H PRN [name], NP   650 mg at 05/01/23 2054   alum & mag hydroxide-simeth (MAALOX/MYLANTA) 200-200-20 MG/5ML suspension 30 mL  30 mL Oral Q4H PRN [name], NP   30 mL at 01/28/23 1530   busPIRone (BUSPAR) tablet 15 mg  15 mg Oral BID [name], NP   15 mg at 05/03/23 0756   cyanocobalamin (VITAMIN B12) injection 1,000 mcg  1,000 mcg Intramuscular Q30 days Sarita Bottom, MD   1,000 mcg at 04/03/23 1237   diphenhydrAMINE (BENADRYL) capsule 50 mg  50 mg Oral TID PRN [name], NP   50 mg at 01/15/23 1211   Or   diphenhydrAMINE (BENADRYL) injection 50 mg  50 mg Intramuscular TID PRN [name], NP       docusate sodium (COLACE) capsule 100 mg  100 mg Oral Daily [name], NP   100 mg at 05/03/23 0756   feeding supplement (ENSURE ENLIVE / ENSURE PLUS) liquid 237 mL  237 mL Oral TID BM Nkwenti, Tyler Aas, NP   237 mL at 05/02/23 2100   haloperidol (HALDOL) tablet 5 mg  5 mg Oral TID PRN [name], NP   5 mg at 01/15/23 1211   Or   haloperidol lactate (HALDOL) injection 5 mg  5 mg Intramuscular TID PRN [name], NP       hydrOXYzine (ATARAX) tablet 25 mg  25 mg Oral TID PRN [name], DO   25 mg at 05/01/23 2053   LORazepam (ATIVAN) tablet 2 mg  2 mg Oral TID PRN [name], NP   2 mg at 01/15/23 1211   Or   LORazepam (ATIVAN) injection 2 mg  2 mg Intramuscular TID PRN [name], NP       magnesium hydroxide (MILK OF MAGNESIA) suspension 30 mL  30 mL Oral Daily PRN [name], NP   30 mL at 04/13/23 0814   melatonin tablet 5 mg  5 mg Oral QHS Nkwenti, Doris, NP   5 mg at 05/02/23 2000   nicotine (NICODERM CQ - dosed in mg/24 hours) patch 21 mg  21 mg Transdermal Daily [name], DO   21 mg at 03/25/23 2440   nicotine polacrilex  (NICORETTE) gum 2 mg  2 mg Oral PRN [name], MD   2 mg at 05/02/23 1832   paliperidone (INVEGA) 24 hr tablet 6 mg  6 mg Oral Daily [name], NP   6 mg at 05/02/23 2000   pantoprazole (PROTONIX) EC tablet 40 mg  40 mg Oral Daily [name], NP   40 mg at 05/03/23 0756   polyethylene glycol (MIRALAX / GLYCOLAX) packet 17 g  17 g Oral  Daily PRN [name], NP   17 g at 04/13/23 1734   sertraline (ZOLOFT) tablet 150 mg  150 mg Oral Daily Massengill, Nathan, MD   150 mg at 05/03/23 0756   SUMAtriptan (IMITREX) tablet 25 mg  25 mg Oral BID PRN [name], NP   25 mg at 04/29/23 1714   traZODone (DESYREL) tablet 50 mg  50 mg Oral QHS Nkwenti, Doris, NP   50 mg at 05/02/23 2000   Vitamin D (Ergocalciferol) (DRISDOL) 1.25 MG (50000 UNIT) capsule 50,000 Units  50,000 Units Oral Q7 days [name], NP   50,000 Units at 04/27/23 1612   Lab Results:  No results found for this or any previous visit (from the past 48 hour(s)).      Blood Alcohol level:  Lab Results  Component Value Date   ETH <10 12/19/2022   ETH <10 11/21/2019   Metabolic Disorder Labs: Lab Results  Component Value Date   HGBA1C 4.4 (L) 12/21/2022   MPG 79.58 12/21/2022   No results found for: "PROLACTIN" Lab Results  Component Value Date   CHOL 218 (H) 12/21/2022   TRIG 81 12/21/2022   HDL 53 12/21/2022   CHOLHDL 4.1 12/21/2022   VLDL 16 12/21/2022   LDLCALC 149 (H) 12/21/2022   LDLCALC 110 (H) 03/13/2011   Physical Findings: AIMS: Facial and Oral Movements Muscles of Facial Expression: None Lips and Perioral Area: None Jaw: None Tongue: None,Extremity Movements Upper (arms, wrists, hands, fingers): None Lower (legs, knees, ankles, toes): None, Trunk Movements Neck, shoulders, hips: None, Global Judgements Severity of abnormal movements overall : None Incapacitation due to abnormal movements: None Patient's awareness of abnormal movements: No Awareness, Dental Status Current  problems with teeth and/or dentures?: No Does patient usually wear dentures?: No  CIWA:    COWS:    AIMS:0 Musculoskeletal: Strength & Muscle Tone: within normal limits Gait & Station: normal Patient leans: N/A  Psychiatric Specialty Exam:  Presentation  General Appearance:  Appropriate for Environment; Casual  Eye Contact: Limited to baseline Speech: Decreased amount, decreased tone and volume but at baseline Speech Volume: Normal  Handedness: Right  Mood and Affect  Mood: Sad and depressed mainly secondary to being in the hospital with no place to go Affect: Constricted  Thought Process  Thought Processes: Coherent  Descriptions of Associations:Intact  Orientation:Full (Time, Place and Person)  Thought Content:Logical [place] History of Schizophrenia/Schizoaffective disorder:Yes  Duration of Psychotic Symptoms:Greater than six months  Hallucinations:Hallucinations: None     Ideas of Reference: None  Suicidal Thoughts:Suicidal Thoughts: No     Homicidal Thoughts:Homicidal Thoughts: No    Sensorium  Memory: Immediate Fair; Recent Fair; Remote Fair  Judgment: Fair  Insight: Fair  Art therapist  Concentration: Fair  Attention Span: Fair  Recall: [employer] of Knowledge: Fair  Language: Fair  Psychomotor Activity  Psychomotor Activity: Psychomotor Activity: Normal     Assets  Assets: Communication Skills; Desire for Improvement  Sleep  Sleep: Sleep: Good Number of Hours of Sleep: 7     Physical Exam: Physical Exam Vitals and nursing note reviewed.  Cardiovascular:     Rate and Rhythm: Normal rate.  Pulmonary:     Effort: Pulmonary effort is normal.  Genitourinary:    Comments: Deferred Musculoskeletal:        General: Normal range of motion.     Cervical back: Normal range of motion.  Skin:    General: Skin is warm and dry.  Neurological:  General: No focal deficit present.     Mental  Status: She is alert and oriented to person, place, and time.  Psychiatric:        Mood and Affect: Mood normal.        Behavior: Behavior normal.        Thought Content: Thought content normal.    Review of Systems  Constitutional:  Negative for fever.  Respiratory:  Negative for cough, shortness of breath and wheezing.   Gastrointestinal:  Positive for abdominal pain and vomiting. Negative for constipation.  Genitourinary:        Pain to light touch on the R side  Neurological:  Negative for dizziness.  Psychiatric/Behavioral:  Negative for depression, hallucinations, memory loss, substance abuse and suicidal ideas. The patient is nervous/anxious and has insomnia.   All other systems reviewed and are negative.  Blood pressure 97/63, pulse 63, temperature 97.8 F (36.6 C), temperature source Oral, resp. rate 16, height 4\' 11"  (1.499 m), weight 63 kg, SpO2 99%. Body mass index is 28.05 kg/m.  Treatment Plan Summary: Daily contact with patient to assess and evaluate symptoms and progress in treatment and Medication management.   Will continue to monitor side pain, patient is reporting patient was sensitive to light touch on the area. At this time it may be 2/2 to patient constantly lying on that side. Patient not having urinary concerns currently.    Still waiting on safe disposition as patient would not be able to live independently given cognitive impairment. Recommend assisted living.  She has a meeting today on 05/02/2023 regarding her placement.   No medication side effects reported.  Because of urinary incontinence, recommended adult diapers to be worn.   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  Plan:  -Continue Imitrex 25 mg PRN BID for  migraines -Continue Colace 100 mg daily for constipation -Continue Vitamin D 50.000 units weekly for bone health. -Continue Sertraline150 mg po Q daily for depression/anxiety -Continue Buspar 15 mg po bid for anxiety.  -Continue paliperidone 6 mg po qd for mood stabilization -Continue Trazodone 50 mg nightly for insomnia  -Continue Nicoderm 21 mg topically Q 24 hrs for nicotine withdrawal management. -Continue vitamin B12 1000 mcg IM weekly for 4 weeks then monthly -Continue Melatonin 5 mg nightly for sleep -Continue Protonix EC 40 mg p.o. daily for GERD -Continue MiraLAX 17 g p.o. PRN for constipation -Continue hydroxyzine 25 mg p.o. 3 times daily as needed for anxiety -Continue Ensure nutritional shakes TID in between meals  -Previously discontinued Hydroxyzine 50 mg nightly-hypotension in the mornings   Safety and Monitoring: Voluntary admission to inpatient psychiatric unit for safety, stabilization and treatment Daily contact with patient to assess and evaluate symptoms and progress in treatment Patient's case to be discussed in multi-disciplinary team meeting Observation Level : q15 minute checks Vital signs: q12 hours Precautions: Safety   Discharge Planning: Social work and case management to assist with discharge planning and identification of hospital follow-up needs prior to discharge Estimated LOS: Unknown at this time. Discharge Concerns: Need to establish a safety plan; Medication compliance and effectiveness Discharge Goals: Return home with outpatient referrals for mental health follow-up including medication management/psychotherapy No legal guardian, has payee Total Time Spent in Direct Patient Care:   I personally spent 15 minutes on the unit in direct patient care. The direct patient care  time included face-to-face time with the patient, reviewing the patient's chart, communicating with other professionals, and coordinating care. Greater than 50% of this time was  spent in counseling or coordinating care with the patient regarding goals of hospitalization, psycho-education, and discharge planning needs.   [name], MD,  05/03/2023, 11:07 AM Patient ID: Larey Brick, female   DOB: 03/05/1976, 47 y.o.   MRN: [id number]

## 2023-05-03 NOTE — Group Note (Signed)
Date:  05/03/2023 Time:  9:12 AM  Group Topic/Focus:  Orientation:   The focus of this group is to educate the patient on the purpose and policies of crisis stabilization and provide a format to answer questions about their admission.  The group details unit policies and expectations of patients while admitted.    Participation Level:  Did Not Attend  Participation Quality:   NA  Affect:   NA  Cognitive:   NA  Insight: None  Engagement in Group:   NA  Modes of Intervention:   NA  Additional Comments:    Beckie Busing 05/03/2023, 9:12 AM

## 2023-05-03 NOTE — BH IP Treatment Plan (Signed)
Interdisciplinary Treatment and Diagnostic Plan Update  05/03/2023 Time of Session: 9:35 AM ( UPDATE )  Yolanda [name] MRN: [id number]  Principal Diagnosis: Schizoaffective disorder, depressive type (HCC)  Secondary Diagnoses: Principal Problem:   Schizoaffective disorder, depressive type (HCC) Active Problems:   GERD (gastroesophageal reflux disease)   Intellectual disability   Tobacco use disorder   Current Medications:  Current Facility-Administered Medications  Medication Dose Route Frequency Provider Last Rate Last Admin   acetaminophen (TYLENOL) tablet 650 mg  650 mg Oral Q6H PRN [name], NP   650 mg at 05/01/23 2054   alum & mag hydroxide-simeth (MAALOX/MYLANTA) 200-200-20 MG/5ML suspension 30 mL  30 mL Oral Q4H PRN [name], NP   30 mL at 01/28/23 1530   busPIRone (BUSPAR) tablet 15 mg  15 mg Oral BID [name], NP   15 mg at 05/03/23 0756   cyanocobalamin (VITAMIN B12) injection 1,000 mcg  1,000 mcg Intramuscular Q30 days Sarita Bottom, MD   1,000 mcg at 05/03/23 1257   diphenhydrAMINE (BENADRYL) capsule 50 mg  50 mg Oral TID PRN [name], NP   50 mg at 01/15/23 1211   Or   diphenhydrAMINE (BENADRYL) injection 50 mg  50 mg Intramuscular TID PRN [name], NP       docusate sodium (COLACE) capsule 100 mg  100 mg Oral Daily [name], NP   100 mg at 05/03/23 0756   feeding supplement (ENSURE ENLIVE / ENSURE PLUS) liquid 237 mL  237 mL Oral TID BM Nkwenti, Doris, NP   237 mL at 05/02/23 2100   haloperidol (HALDOL) tablet 5 mg  5 mg Oral TID PRN [name], NP   5 mg at 01/15/23 1211   Or   haloperidol lactate (HALDOL) injection 5 mg  5 mg Intramuscular TID PRN [name], NP       hydrocortisone cream 1 %   Topical BID [name], MD       hydrOXYzine (ATARAX) tablet 25 mg  25 mg Oral TID PRN [name], DO   25 mg at 05/01/23 2053   LORazepam (ATIVAN) tablet 2 mg  2 mg Oral TID PRN [name], NP   2 mg at 01/15/23 1211    Or   LORazepam (ATIVAN) injection 2 mg  2 mg Intramuscular TID PRN [name], NP       magnesium hydroxide (MILK OF MAGNESIA) suspension 30 mL  30 mL Oral Daily PRN [name], NP   30 mL at 04/13/23 0814   melatonin tablet 5 mg  5 mg Oral QHS Nkwenti, Doris, NP   5 mg at 05/02/23 2000   nicotine (NICODERM CQ - dosed in mg/24 hours) patch 21 mg  21 mg Transdermal Daily [name], DO   21 mg at 03/25/23 3086   nicotine polacrilex (NICORETTE) gum 2 mg  2 mg Oral PRN [name], MD   2 mg at 05/03/23 1257   paliperidone (INVEGA) 24 hr tablet 6 mg  6 mg Oral Daily Nkwenti, Tyler Aas, NP   6 mg at 05/02/23 2000   pantoprazole (PROTONIX) EC tablet 40 mg  40 mg Oral Daily Nwoko, Nicole Kindred I, NP   40 mg at 05/03/23 0756   polyethylene glycol (MIRALAX / GLYCOLAX) packet 17 g  17 g Oral Daily PRN [name], NP   17 g at 04/13/23 1734   sertraline (ZOLOFT) tablet 150 mg  150 mg Oral Daily Massengill, Harrold Donath, MD   150 mg at  05/03/23 0756   SUMAtriptan (IMITREX) tablet 25 mg  25 mg Oral BID PRN [name], NP   25 mg at 04/29/23 1714   traZODone (DESYREL) tablet 50 mg  50 mg Oral QHS Nkwenti, Doris, NP   50 mg at 05/02/23 2000   Vitamin D (Ergocalciferol) (DRISDOL) 1.25 MG (50000 UNIT) capsule 50,000 Units  50,000 Units Oral Q7 days [name], NP   50,000 Units at 04/27/23 1612   PTA Medications: Medications Prior to Admission  Medication Sig Dispense Refill Last Dose   busPIRone (BUSPAR) 15 MG tablet Take 15 mg by mouth 2 (two) times daily. (Patient not taking: Reported on 12/19/2022)      paliperidone (INVEGA SUSTENNA) 156 MG/ML SUSY injection Inject 156 mg into the muscle once. (Patient not taking: Reported on 12/19/2022)      sertraline (ZOLOFT) 50 MG tablet Take 150 mg by mouth daily. (Patient not taking: Reported on 12/19/2022)      traZODone (DESYREL) 100 MG tablet Take 100 mg by mouth at bedtime as needed for sleep. (Patient not taking: Reported on 11/12/2022)       Patient  Stressors: Medication change or noncompliance    Patient Strengths: Forensic psychologist fund of knowledge   Treatment Modalities: Medication Management, Group therapy, Case management,  1 to 1 session with clinician, Psychoeducation, Recreational therapy.   Physician Treatment Plan for Primary Diagnosis: Schizoaffective disorder, depressive type (HCC) Long Term Goal(s): Improvement in symptoms so as ready for discharge   Short Term Goals: Ability to identify and develop effective coping behaviors will improve Ability to maintain clinical measurements within normal limits will improve Compliance with prescribed medications will improve Ability to identify triggers associated with substance abuse/mental health issues will improve Ability to identify changes in lifestyle to reduce recurrence of condition will improve Ability to verbalize feelings will improve Ability to disclose and discuss suicidal ideas Ability to demonstrate self-control will improve  Medication Management: Evaluate patient's response, side effects, and tolerance of medication regimen.  Therapeutic Interventions: 1 to 1 sessions, Unit Group sessions and Medication administration.  Evaluation of Outcomes: Progressing  Physician Treatment Plan for Secondary Diagnosis: Principal Problem:   Schizoaffective disorder, depressive type (HCC) Active Problems:   GERD (gastroesophageal reflux disease)   Intellectual disability   Tobacco use disorder  Long Term Goal(s): Improvement in symptoms so as ready for discharge   Short Term Goals: Ability to identify and develop effective coping behaviors will improve Ability to maintain clinical measurements within normal limits will improve Compliance with prescribed medications will improve Ability to identify triggers associated with substance abuse/mental health issues will improve Ability to identify changes in lifestyle to reduce recurrence of condition will  improve Ability to verbalize feelings will improve Ability to disclose and discuss suicidal ideas Ability to demonstrate self-control will improve     Medication Management: Evaluate patient's response, side effects, and tolerance of medication regimen.  Therapeutic Interventions: 1 to 1 sessions, Unit Group sessions and Medication administration.  Evaluation of Outcomes: Progressing   RN Treatment Plan for Primary Diagnosis: Schizoaffective disorder, depressive type (HCC) Long Term Goal(s): Knowledge of disease and therapeutic regimen to maintain health will improve  Short Term Goals: Ability to remain free from injury will improve, Ability to verbalize frustration and anger appropriately will improve, Ability to participate in decision making will improve, Ability to verbalize feelings will improve, Ability to identify and develop effective coping behaviors will improve, and Compliance with prescribed medications will improve  Medication Management: RN  will administer medications as ordered by provider, will assess and evaluate patient's response and provide education to patient for prescribed medication. RN will report any adverse and/or side effects to prescribing provider.  Therapeutic Interventions: 1 on 1 counseling sessions, Psychoeducation, Medication administration, Evaluate responses to treatment, Monitor vital signs and CBGs as ordered, Perform/monitor CIWA, COWS, AIMS and Fall Risk screenings as ordered, Perform wound care treatments as ordered.  Evaluation of Outcomes: Progressing   LCSW Treatment Plan for Primary Diagnosis: Schizoaffective disorder, depressive type (HCC) Long Term Goal(s): Safe transition to appropriate next level of care at discharge, Engage patient in therapeutic group addressing interpersonal concerns.  Short Term Goals: Engage patient in aftercare planning with referrals and resources, Increase social support, Increase emotional regulation, Facilitate  acceptance of mental health diagnosis and concerns, Identify triggers associated with mental health/substance abuse issues, and Increase skills for wellness and recovery  Therapeutic Interventions: Assess for all discharge needs, 1 to 1 time with Social worker, Explore available resources and support systems, Assess for adequacy in community support network, Educate family and significant other(s) on suicide prevention, Complete Psychosocial Assessment, Interpersonal group therapy.  Evaluation of Outcomes: Progressing   Progress in Treatment: Attending groups: Yes. Participating in groups: Yes. Taking medication as prescribed: Yes. Toleration medication: Yes. Family/Significant other contact made: Yes, individual(s) contacted:  Mother Yolanda [name] Patient understands diagnosis: Yes. Discussing patient identified problems/goals with staff: Yes. Medical problems stabilized or resolved: Yes. Denies suicidal/homicidal ideation: Yes. Issues/concerns per patient self-inventory: No.    New problem(s) identified: No, Describe:  None reported   New Short Term/Long Term Goal(s): medication stabilization, elimination of SI thoughts, development of comprehensive mental wellness plan.      Patient Goals:  "Housing"   Discharge Plan or Barriers: Patient recently admitted. CSW will continue to follow and assess for appropriate referrals and possible discharge planning.    Reason for Continuation of Hospitalization: Anxiety Depression Medication stabilization Suicidal ideation   Estimated Length of Stay: TBD, AWAITING FOR PLACEMENT   Last 3 [place] Suicide Severity Risk Score: Flowsheet Row Ad[facility] (Current) [facility] from 12/19/2022 in [facility] Department at [facility] ED from 11/12/2022 in [facility] Emergency Department at [facility]  C-SSRS RISK CATEGORY High Risk High Risk Moderate Risk       Last  [facility] 2/9 Scores:     No data to display          Scribe for Treatment Team: Beather Arbour 05/03/2023 3:17 PM

## 2023-05-03 NOTE — Progress Notes (Signed)
   05/03/23 0000  Psych Admission Type (Psych Patients Only)  Admission Status Involuntary  Psychosocial Assessment  Patient Complaints Anxiety;Depression  Eye Contact Fair  Facial Expression Flat  Affect Depressed;Sad  Speech Soft  Interaction Childlike  Motor Activity Slow  Appearance/Hygiene Unremarkable  Behavior Characteristics Cooperative;Appropriate to situation  Mood Depressed;Anxious  Thought Process  Coherency WDL  Content WDL  Delusions None reported or observed  Perception WDL  Hallucination None reported or observed  Judgment Impaired  Confusion None  Danger to Self  Current suicidal ideation? Denies  Self-Injurious Behavior No self-injurious ideation or behavior indicators observed or expressed   Agreement Not to Harm Self Yes  Description of Agreement verbal  Danger to Others  Danger to Others None reported or observed

## 2023-05-03 NOTE — Progress Notes (Signed)
   05/03/23 1100  Psych Admission Type (Psych Patients Only)  Admission Status Involuntary  Psychosocial Assessment  Patient Complaints Anxiety;Depression  Eye Contact Fair  Facial Expression Flat  Affect Depressed;Sad  Speech Soft  Interaction Childlike  Motor Activity Slow  Appearance/Hygiene Unremarkable  Behavior Characteristics Cooperative;Appropriate to situation  Mood Depressed;Anxious  Thought Process  Coherency WDL  Content WDL  Delusions None reported or observed  Perception WDL  Hallucination None reported or observed  Judgment Impaired  Confusion None  Danger to Self  Current suicidal ideation? Denies  Description of Suicide Plan No plan  Self-Injurious Behavior No self-injurious ideation or behavior indicators observed or expressed   Agreement Not to Harm Self Yes  Description of Agreement Verbal  Danger to Others  Danger to Others None reported or observed

## 2023-05-03 NOTE — Progress Notes (Signed)
Adult Psychoeducational Group Note  Date:  05/03/2023 Time:  9:58 PM  Group Topic/Focus:    Participation Level:    Participation Quality:    Affect:    Cognitive:    Insight:   Engagement in Group:    Modes of Intervention:    Additional Comments: Elira did not attend wrap up group  Charna Busman Long  05/03/2023, 9:58 PM

## 2023-05-03 NOTE — Group Note (Signed)
Date:  05/03/2023 Time:  12:56 PM  Group Topic/Focus:  Goals Group:   The focus of this group is to help patients establish daily goals to achieve during treatment and discuss how the patient can incorporate goal setting into their daily lives to aide in recovery.    Participation Level:  Did Not Attend  Participation Quality:   NA  Affect:   NA  Cognitive:   NA  Insight: None  Engagement in Group:   NA  Modes of Intervention:   NA  Additional Comments:  NA  Beckie Busing 05/03/2023, 12:56 PM

## 2023-05-03 NOTE — Plan of Care (Signed)
  Problem: Activity: Goal: Interest or engagement in leisure activities will improve Outcome: Progressing Goal: Imbalance in normal sleep/wake cycle will improve Outcome: Progressing   

## 2023-05-03 NOTE — Group Note (Signed)
Date:  05/03/2023 Time:  1:12 PM  Group Topic/Focus:  Developing a Wellness Toolbox:   The focus of this group is to help patients develop a "wellness toolbox" with skills and strategies to promote recovery upon discharge.    Participation Level:  Did Not Attend  Participation Quality:   NA  Affect:   NA  Cognitive:   NA  Insight: None  Engagement in Group:   NA  Modes of Intervention:   NA  Additional Comments:    Beckie Busing 05/03/2023, 1:12 PM

## 2023-05-03 NOTE — [facility] Group Notes (Signed)
[facility] Group Notes:  (Nursing/MHT/Case Management/Adjunct)  Date:  05/03/2023  Time:  9:07 PM  Type of Therapy:   wrap-up group  Participation Level:  Active  Participation Quality:  Appropriate  Affect:  Appropriate  Cognitive:  Appropriate  Insight:  Appropriate  Engagement in Group:  Engaged  Modes of Intervention:  Education  Summary of Progress/Problems: Pt attended [employer].  Yolanda [name] 05/03/2023, 9:07 PM

## 2023-05-03 NOTE — Group Note (Signed)
Recreation Therapy Group Note   Group Topic:Team Building  Group Date: 05/03/2023 Start Time: 0935 End Time: 0955 Facilitators: Danaye Sobh-McCall, LRT,CTRS Location: [address] Dayroom   Group Topic: Team Building, Problem Solving  Goal Area(s) Addresses:  Patient will effectively work with peer towards shared goal.  Patient will identify skills used to make activity successful.  Patient will identify how skills used during activity can be applied to reach post d/c goals.   Intervention: STEM Activity- Glass blower/designer  Group Description: Tallest Pharmacist, community. In teams of 5-6, patients were given 11 craft pipe cleaners. Using the materials provided, patients were instructed to compete again the opposing team(s) to build the tallest free-standing structure from floor level. The activity was timed; difficulty increased by Clinical research associate as Production designer, theatre/television/film continued.  Systematically resources were removed with additional directions for example, placing one arm behind their back, working in silence, and shape stipulations. LRT facilitated post-activity discussion reviewing team processes and necessary communication skills involved in completion. Patients were encouraged to reflect how the skills utilized, or not utilized, in this activity can be incorporated to positively impact support systems post discharge.  Education: Pharmacist, community, Scientist, physiological, Discharge Planning   Education Outcome: Acknowledges education/In group clarification offered/Needs additional education.    Affect/Mood: N/A   Participation Level: Did not attend    Clinical Observations/Individualized Feedback:     Plan: Continue to engage patient in RT group sessions 2-3x/week.   Ludie Pavlik-McCall, LRT,CTRS 05/03/2023 12:03 PM

## 2023-05-03 NOTE — Plan of Care (Signed)
  Problem: Safety: Goal: Ability to disclose and discuss suicidal ideas will improve Outcome: Progressing Goal: Ability to identify and utilize support systems that promote safety will improve Outcome: Progressing   Problem: Self-Concept: Goal: Will verbalize positive feelings about self Outcome: Progressing Goal: Level of anxiety will decrease Outcome: Progressing   

## 2023-05-04 DIAGNOSIS — F251 Schizoaffective disorder, depressive type: Secondary | ICD-10-CM | POA: Diagnosis not present

## 2023-05-04 NOTE — Progress Notes (Signed)
[facility] MD Progress Note  05/04/2023 9:35 AM Yolanda [name]  MRN:  [id number]  Principal Problem: Schizoaffective disorder, depressive type (HCC)  Diagnosis: Principal Problem:   Schizoaffective disorder, depressive type (HCC) Active Problems:   GERD (gastroesophageal reflux disease)   Intellectual disability   Tobacco use disorder  Reason for admission: This is the first psychiatric admission in this [facility] in 12 years for this 21 AA female with an extensive hx of mental illnesses & probable polysubstance use disorders. She is admitted to the [facility] from the [facility] hospital with complain of worsening suicidal ideations with plan to stab herself. Per chart review, patient apparently reported at the ED that she has been depressed for a while & has not been taking her mental health medications. After medical evaluation.clearance, she was transferred to the [facility] for further psychiatric evaluation/treatments.   Patient assessment note: Staff reports no change in the patient's behavior.  She continues to take her medications as prescribed and has not had any PRNs.  No behavioral issues are noted.  She is still awaiting placement.  She continues to prepare for her GED.   Patient has been stable on medications and yesterday no changes have been made.  When seen today the patient is alert oriented and cooperative.  She is pleasant and compliant with treatment.  She maintained good eye contact.  Speech is without any obvious LOA or FOI.  She denies any active SI/HI/AVH. She wants to continue to do her GED preparation and wants to take a practice GED test.  Total Time spent with patient:  20 minutes  Past Psychiatric History:  See H & P  Past Medical History:  Past Medical History:  Diagnosis Date   Bipolar affect, depressed (HCC)    Constipation 08/17/2022   Depression    Falls 07/21/2022   Fracture of femoral neck, right, closed (HCC) 01/17/2022   Herpes simplex 08/22/2017   Open fracture  dislocation of right elbow joint 01/17/2022    Past Surgical History:  Procedure Laterality Date   NO PAST SURGERIES     SALPINGECTOMY     Family History: History reviewed. No pertinent family history.  Family Psychiatric  History: See H & P  Social History:  Social History   Substance and Sexual Activity  Alcohol Use Yes     Social History   Substance and Sexual Activity  Drug Use Yes   Types: Cocaine, Marijuana    Social History   Socioeconomic History   Marital status: Single    Spouse name: Not on file   Number of children: Not on file   Years of education: Not on file   Highest education level: Not on file  Occupational History   Not on file  Tobacco Use   Smoking status: Every Day   Smokeless tobacco: Not on file  Substance and Sexual Activity   Alcohol use: Yes   Drug use: Yes    Types: Cocaine, Marijuana   Sexual activity: Yes  Other Topics Concern   Not on file  Social History Narrative   Not on file   Social Determinants of Health   Financial Resource Strain: Low Risk  (09/12/2022)   Received from [facility], [facility]   Overall Financial Resource Strain (CARDIA)    Difficulty of Paying Living Expenses: Not hard at all  Food Insecurity: Patient Declined (12/20/2022)   Hunger Vital Sign    Worried About Running Out of Food in the Last Year: Patient declined  Ran Out of Food in the Last Year: Patient declined  Transportation Needs: No Transportation Needs (12/20/2022)   PRAPARE - Administrator, Civil Service (Medical): No    Lack of Transportation (Non-Medical): No  Physical Activity: Not on file  Stress: No Stress Concern Present (07/17/2022)   Received from [facility], [facility] - Occupational Stress Questionnaire    Feeling of Stress : Not at all  Social Connections: Unknown (07/16/2022)   Received from [facility], [facility]   Social Network    Social Network: Not on  file   Sleep: Good  Appetite:  Good  Current Medications: Current Facility-Administered Medications  Medication Dose Route Frequency Provider Last Rate Last Admin   acetaminophen (TYLENOL) tablet 650 mg  650 mg Oral Q6H PRN [name], NP   650 mg at 05/01/23 2054   alum & mag hydroxide-simeth (MAALOX/MYLANTA) 200-200-20 MG/5ML suspension 30 mL  30 mL Oral Q4H PRN [name], NP   30 mL at 01/28/23 1530   busPIRone (BUSPAR) tablet 15 mg  15 mg Oral BID [name], NP   15 mg at 05/04/23 0827   cyanocobalamin (VITAMIN B12) injection 1,000 mcg  1,000 mcg Intramuscular Q30 days Sarita Bottom, MD   1,000 mcg at 05/03/23 1257   diphenhydrAMINE (BENADRYL) capsule 50 mg  50 mg Oral TID PRN [name], NP   50 mg at 01/15/23 1211   Or   diphenhydrAMINE (BENADRYL) injection 50 mg  50 mg Intramuscular TID PRN [name], NP       docusate sodium (COLACE) capsule 100 mg  100 mg Oral Daily Nkwenti, Tyler Aas, NP   100 mg at 05/04/23 0827   feeding supplement (ENSURE ENLIVE / ENSURE PLUS) liquid 237 mL  237 mL Oral TID BM Nkwenti, Doris, NP   237 mL at 05/03/23 2106   haloperidol (HALDOL) tablet 5 mg  5 mg Oral TID PRN [name], NP   5 mg at 01/15/23 1211   Or   haloperidol lactate (HALDOL) injection 5 mg  5 mg Intramuscular TID PRN [name], NP       hydrocortisone cream 1 %   Topical BID [name], MD   1 Application at 05/04/23 1308   hydrOXYzine (ATARAX) tablet 25 mg  25 mg Oral TID PRN [name], DO   25 mg at 05/01/23 2053   LORazepam (ATIVAN) tablet 2 mg  2 mg Oral TID PRN [name], NP   2 mg at 01/15/23 1211   Or   LORazepam (ATIVAN) injection 2 mg  2 mg Intramuscular TID PRN [name], NP       magnesium hydroxide (MILK OF MAGNESIA) suspension 30 mL  30 mL Oral Daily PRN [name], NP   30 mL at 04/13/23 0814   melatonin tablet 5 mg  5 mg Oral QHS Nkwenti, Doris, NP   5 mg at 05/03/23 2107   nicotine (NICODERM CQ - dosed in mg/24 hours) patch 21  mg  21 mg Transdermal Daily [name], DO   21 mg at 03/25/23 6578   nicotine polacrilex (NICORETTE) gum 2 mg  2 mg Oral PRN [name], MD   2 mg at 05/03/23 1257   paliperidone (INVEGA) 24 hr tablet 6 mg  6 mg Oral Daily [name], NP   6 mg at 05/03/23 2107   pantoprazole (PROTONIX) EC tablet 40 mg  40 mg Oral Daily Nwoko,  Nicole Kindred I, NP   40 mg at 05/04/23 0827   polyethylene glycol (MIRALAX / GLYCOLAX) packet 17 g  17 g Oral Daily PRN [name], NP   17 g at 04/13/23 1734   sertraline (ZOLOFT) tablet 150 mg  150 mg Oral Daily Massengill, Nathan, MD   150 mg at 05/04/23 0827   SUMAtriptan (IMITREX) tablet 25 mg  25 mg Oral BID PRN [name], NP   25 mg at 04/29/23 1714   traZODone (DESYREL) tablet 50 mg  50 mg Oral QHS [name], NP   50 mg at 05/03/23 2107   Vitamin D (Ergocalciferol) (DRISDOL) 1.25 MG (50000 UNIT) capsule 50,000 Units  50,000 Units Oral Q7 days [name], NP   50,000 Units at 04/27/23 1612   Lab Results:  No results found for this or any previous visit (from the past 48 hour(s)).      Blood Alcohol level:  Lab Results  Component Value Date   ETH <10 12/19/2022   ETH <10 11/21/2019   Metabolic Disorder Labs: Lab Results  Component Value Date   HGBA1C 4.4 (L) 12/21/2022   MPG 79.58 12/21/2022   No results found for: "PROLACTIN" Lab Results  Component Value Date   CHOL 218 (H) 12/21/2022   TRIG 81 12/21/2022   HDL 53 12/21/2022   CHOLHDL 4.1 12/21/2022   VLDL 16 12/21/2022   LDLCALC 149 (H) 12/21/2022   LDLCALC 110 (H) 03/13/2011   Physical Findings: AIMS: Facial and Oral Movements Muscles of Facial Expression: None Lips and Perioral Area: None Jaw: None Tongue: None,Extremity Movements Upper (arms, wrists, hands, fingers): None Lower (legs, knees, ankles, toes): None, Trunk Movements Neck, shoulders, hips: None, Global Judgements Severity of abnormal movements overall : None Incapacitation due to abnormal  movements: None Patient's awareness of abnormal movements: No Awareness, Dental Status Current problems with teeth and/or dentures?: No Does patient usually wear dentures?: No  CIWA:    COWS:    AIMS:0 Musculoskeletal: Strength & Muscle Tone: within normal limits Gait & Station: normal Patient leans: N/A  Psychiatric Specialty Exam:  Presentation  General Appearance:  Casual  Eye Contact: Limited to baseline Speech: Decreased amount, decreased tone and volume but at baseline Speech Volume: Normal  Handedness: Right  Mood and Affect  Mood: Sad and depressed mainly secondary to being in the hospital with no place to go Affect: Restricted  Thought Process  Thought Processes: Coherent  Descriptions of Associations:Intact  Orientation:Full (Time, Place and Person)  Thought Content:Logical [place] History of Schizophrenia/Schizoaffective disorder:Yes  Duration of Psychotic Symptoms:Greater than six months  Hallucinations:Hallucinations: None     Ideas of Reference: None  Suicidal Thoughts:Suicidal Thoughts: No     Homicidal Thoughts:Homicidal Thoughts: No    Sensorium  Memory: Recent Good; Remote Fair; Immediate Good  Judgment: Fair  Insight: Fair  Art therapist  Concentration: Good  Attention Span: Good  Recall: Good  Fund of Knowledge: Good  Language: Good  Psychomotor Activity  Psychomotor Activity: Psychomotor Activity: Normal     Assets  Assets: Communication Skills; Desire for Improvement  Sleep  Sleep: Sleep: Good Number of Hours of Sleep: 7.25     Physical Exam: Physical Exam Vitals and nursing note reviewed.  Cardiovascular:     Rate and Rhythm: Normal rate.  Pulmonary:     Effort: Pulmonary effort is normal.  Genitourinary:    Comments: Deferred Musculoskeletal:        General: Normal range of motion.     Cervical back:  Normal range of motion.  Skin:    General: Skin is warm and dry.   Neurological:     General: No focal deficit present.     Mental Status: She is alert and oriented to person, place, and time.  Psychiatric:        Mood and Affect: Mood normal.        Behavior: Behavior normal.        Thought Content: Thought content normal.    Review of Systems  Constitutional:  Negative for fever.  Respiratory:  Negative for cough, shortness of breath and wheezing.   Gastrointestinal:  Positive for abdominal pain and vomiting. Negative for constipation.  Genitourinary:        Pain to light touch on the R side  Neurological:  Negative for dizziness.  Psychiatric/Behavioral:  Negative for depression, hallucinations, memory loss, substance abuse and suicidal ideas. The patient is nervous/anxious and has insomnia.   All other systems reviewed and are negative.  Blood pressure 106/73, pulse 75, temperature 97.8 F (36.6 C), temperature source Oral, resp. rate 16, height 4\' 11"  (1.499 m), weight 63 kg, SpO2 99%. Body mass index is 28.05 kg/m.  Treatment Plan Summary: Daily contact with patient to assess and evaluate symptoms and progress in treatment and Medication management.   Will continue to monitor side pain, patient is reporting patient was sensitive to light touch on the area. At this time it may be 2/2 to patient constantly lying on that side. Patient not having urinary concerns currently.    Still waiting on safe disposition as patient would not be able to live independently given cognitive impairment. Recommend assisted living.  She has a meeting today on 05/02/2023 regarding her placement.   No medication side effects reported.  Because of urinary incontinence, recommended adult diapers to be worn.   Principal/active diagnoses:  Schizoaffective disorder, depressive type (HCC)  Active Problems: GERD (gastroesophageal reflux disease) Intellectual disability Tobacco use disorder (MOCA 16/30) moderate cognitive impairment probably related to moderate  intellectual disability.  Plan:  -Continue Imitrex 25 mg PRN BID for migraines -Continue Colace 100 mg daily for constipation -Continue Vitamin D 50.000 units weekly for bone health. -Continue Sertraline150 mg po Q daily for depression/anxiety -Continue Buspar 15 mg po bid for anxiety.  -Continue paliperidone 6 mg po qd for mood stabilization -Continue Trazodone 50 mg nightly for insomnia  -Continue Nicoderm 21 mg topically Q 24 hrs for nicotine withdrawal management. -Continue vitamin B12 1000 mcg IM weekly for 4 weeks then monthly -Continue Melatonin 5 mg nightly for sleep -Continue Protonix EC 40 mg p.o. daily for GERD -Continue MiraLAX 17 g p.o. PRN for constipation -Continue hydroxyzine 25 mg p.o. 3 times daily as needed for anxiety -Continue Ensure nutritional shakes TID in between meals  -Previously discontinued Hydroxyzine 50 mg nightly-hypotension in the mornings   Safety and Monitoring: Voluntary admission to inpatient psychiatric unit for safety, stabilization and treatment Daily contact with patient to assess and evaluate symptoms and progress in treatment Patient's case to be discussed in multi-disciplinary team meeting Observation Level : q15 minute checks Vital signs: q12 hours Precautions: Safety   Discharge Planning: Social work and case management to assist with discharge planning and identification of hospital follow-up needs prior to discharge Estimated LOS: Unknown at this time. Discharge Concerns: Need to establish a safety plan; Medication compliance and effectiveness Discharge Goals: Return home with outpatient referrals for mental health follow-up including medication management/psychotherapy No legal guardian, has payee Total Time Spent in  Direct Patient Care:   I personally spent 15 minutes on the unit in direct patient care. The direct patient care time included face-to-face time with the patient, reviewing the patient's chart, communicating with other  professionals, and coordinating care. Greater than 50% of this time was spent in counseling or coordinating care with the patient regarding goals of hospitalization, psycho-education, and discharge planning needs.   [name], MD,  05/04/2023, 9:35 AM Patient ID: Yolanda [name], female   DOB: Jan 11, 1976, 47 y.o.   MRN: [id number]

## 2023-05-04 NOTE — Progress Notes (Signed)
   05/04/23 1200  Psych Admission Type (Psych Patients Only)  Admission Status Involuntary  Psychosocial Assessment  Patient Complaints Depression  Eye Contact Fair  Facial Expression Animated  Affect Appropriate to circumstance  Speech Soft  Interaction Assertive;Childlike  Motor Activity Slow  Appearance/Hygiene Unremarkable  Behavior Characteristics Cooperative  Mood Depressed  Thought Process  Coherency WDL  Content WDL  Delusions WDL  Perception WDL  Hallucination None reported or observed  Judgment Impaired  Confusion None  Danger to Self  Current suicidal ideation? Denies  Self-Injurious Behavior No self-injurious ideation or behavior indicators observed or expressed   Agreement Not to Harm Self Yes  Description of Agreement verbal  Danger to Others  Danger to Others None reported or observed

## 2023-05-04 NOTE — Progress Notes (Signed)
Pt c/o feelings of sadness.  She was crying and pointing to her head. When asked what was wrong pt replied " my manic depression and schizophrenia are bothering me".  Pt given prn med as ordered.

## 2023-05-04 NOTE — Group Note (Signed)
Date:  05/04/2023 Time:  3:52 PM  Group Topic/Focus:  Rediscovering Joy:   The focus of this group is to engage in a team building activity to discover joy and working together as a team to achieve a goal.    Participation Level:  Minimal  Participation Quality:  Supportive  Affect:  Appropriate  Cognitive:  Appropriate  Insight: Appropriate  Engagement in Group:  Limited  Modes of Intervention:  Activity and Socialization  Additional Comments:  Patients formed two teams to compete in trivia questions. Liset arrived to group late, but joined a team upon her arrival. She was limited in interactions with the group, but offered support and was appropriate in her behaviors.   Gardiner Barefoot 05/04/2023, 3:52 PM

## 2023-05-04 NOTE — Group Note (Signed)
Date:  05/04/2023 Time:  11:20 PM  Group Topic/Focus:  Wrap-Up Group:   The focus of this group is to help patients review their daily goal of treatment and discuss progress on daily workbooks.    Participation Level:  Minimal  Participation Quality:  Appropriate  Affect:  Appropriate  Cognitive:  Appropriate  Insight: Improving  Engagement in Group:  Engaged  Modes of Intervention:  Socialization  Additional Comments:  Pt attended the evening wrap-up group. Tech introduced the staff for the evening, reminded group of the evening schedule and reminded them to ask for anything they need. PT worked on Education officer, community through music.  Osa Craver 05/04/2023, 11:20 PM

## 2023-05-04 NOTE — Plan of Care (Signed)
  Problem: Education: Goal: Utilization of techniques to improve thought processes will improve Outcome: Progressing Goal: Knowledge of the prescribed therapeutic regimen will improve Outcome: Progressing   Problem: Activity: Goal: Interest or engagement in leisure activities will improve Outcome: Progressing Goal: Imbalance in normal sleep/wake cycle will improve Outcome: Progressing   Problem: Coping: Goal: Coping ability will improve Outcome: Progressing Goal: Will verbalize feelings Outcome: Progressing   Problem: Safety: Goal: Ability to disclose and discuss suicidal ideas will improve Outcome: Progressing Goal: Ability to identify and utilize support systems that promote safety will improve Outcome: Progressing

## 2023-05-04 NOTE — Group Note (Unsigned)
Date:  05/04/2023 Time:  3:45 PM  Group Topic/Focus:  Rediscovering Joy:   The focus of this group is to engage in a team building activity to discover joy and working together as a team to achieve a goal.     Participation Level:  {BHH PARTICIPATION ZOXWR:60454}  Participation Quality:  {BHH PARTICIPATION QUALITY:22265}  Affect:  {BHH AFFECT:22266}  Cognitive:  {BHH COGNITIVE:22267}  Insight: {BHH Insight2:20797}  Engagement in Group:  {B[facility] ENGAGEMENT IN UJWJX:91478}  Modes of Intervention:  {BHH MODES OF INTERVENTION:22269}  Additional Comments:  ***  Gardiner Barefoot 05/04/2023, 3:45 PM

## 2023-05-04 NOTE — Progress Notes (Signed)
   05/03/23 2050  Psych Admission Type (Psych Patients Only)  Admission Status Involuntary  Psychosocial Assessment  Patient Complaints Depression  Eye Contact Fair  Facial Expression Animated  Affect Appropriate to circumstance  Speech Soft  Interaction Assertive;Childlike  Motor Activity Slow  Appearance/Hygiene Unremarkable  Behavior Characteristics Cooperative;Appropriate to situation  Mood Depressed  Thought Process  Coherency WDL  Content WDL  Delusions WDL  Perception WDL  Hallucination None reported or observed  Judgment Impaired  Confusion None  Danger to Self  Current suicidal ideation? Denies  Self-Injurious Behavior No self-injurious ideation or behavior indicators observed or expressed   Agreement Not to Harm Self Yes  Description of Agreement verbal  Danger to Others  Danger to Others None reported or observed

## 2023-05-05 DIAGNOSIS — F251 Schizoaffective disorder, depressive type: Secondary | ICD-10-CM | POA: Diagnosis not present

## 2023-05-05 NOTE — Progress Notes (Signed)
   05/04/23 2158  Psych Admission Type (Psych Patients Only)  Admission Status Involuntary  Psychosocial Assessment  Patient Complaints Depression  Eye Contact Fair  Facial Expression Animated  Affect Appropriate to circumstance  Speech Slow  Interaction Assertive;Childlike  Motor Activity Slow  Appearance/Hygiene Unremarkable  Behavior Characteristics Cooperative  Mood Depressed  Thought Process  Coherency WDL  Content WDL  Delusions WDL  Perception WDL  Hallucination None reported or observed  Judgment Impaired  Confusion None  Danger to Self  Current suicidal ideation? Denies  Description of Agreement verbal  Danger to Others  Danger to Others None reported or observed

## 2023-05-05 NOTE — [facility] Group Notes (Signed)
[facility] Group Notes:  (Nursing/MHT/Case Management/Adjunct)  Date:  05/05/2023  Time:  9:38 PM  Type of Therapy:   The focus of this group is to help patients review their daily goal of treatment and discuss progress on daily workbooks.  Participation Level:  Active  Participation Quality:  Sharing and Supportive  Affect:  Appropriate  Cognitive:  Alert and Appropriate  Insight:  Appropriate and Good  Engagement in Group:  Engaged and Supportive  Modes of Intervention:  Discussion, Education, Socialization, and Support  Summary of Progress/Problems: Pt attended group  Yolanda [name] 05/05/2023, 9:38 PM

## 2023-05-05 NOTE — [facility] Group Notes (Signed)
Type of Therapy and Topic:  Group Therapy:Gratitude  Participation Level:  Did Not Attend   Description of Group:   In this group, patients shared and discussed the importance of acknowledging the elements in their lives for which they are grateful and how this can positively impact their mood.  The group discussed how bringing the positive elements of their lives to the forefront of their minds can help with recovery from any illness, physical or mental.  An exercise was done as a group in which a list was made of gratitude items in order to encourage participants to consider other potential positives in their lives.  Therapeutic Goals: Patients will identify one or more item for which they are grateful in each of 6 categories:  people, experiences, things, places, skills, and other. Patients will discuss how it is possible to seek out gratitude in even bad situations. Patients will explore other possible items of gratitude that they could remember.   Summary of Patient Progress: NA  Therapeutic Modalities:   Solution-Focused Therapy Activity

## 2023-05-05 NOTE — Progress Notes (Signed)
[facility] MD Progress Note  05/05/2023 11:06 AM [name]  MRN:  [id number]  Principal Problem: Schizoaffective disorder, depressive type (HCC)  Diagnosis: Principal Problem:   Schizoaffective disorder, depressive type (HCC) Active Problems:   GERD (gastroesophageal reflux disease)   Intellectual disability   Tobacco use disorder  Reason for admission: This is the first psychiatric admission in this [facility] in 12 years for this 47 AA female with an extensive hx of mental illnesses & probable polysubstance use disorders. She is admitted to the [facility] from the [facility] hospital with complain of worsening suicidal ideations with plan to stab herself. Per chart review, patient apparently reported at the ED that she has been depressed for a while & has not been taking her mental health medications. After medical evaluation.clearance, she was transferred to the [facility] for further psychiatric evaluation/treatments.   Patient assessment note: Staff reports no change in the patient's behavior.  Overall she has been taking oral medications as prescribed and slept 7 hours.  However there was 1 episode yesterday when apparently she had increased hallucinations.  Patient has been stable on medications and yesterday no changes were made.  When seen today, the patient was lying in bed but was alert oriented and cooperative.  She maintained fair to good eye contact.  Her speech was coherent without any obvious looseness of association or flight of ideas or tangentiality.  Today she denies any depression and denies any psychosis.  She states that she is not hearing any voices but may have had an episode that lasted briefly yesterday. She is cognitively intact.  Total Time spent with patient:  20 minutes  Past Psychiatric History:  See H & P  Past Medical History:  Past Medical History:  Diagnosis Date   Bipolar affect, depressed (HCC)    Constipation 08/17/2022   Depression    Falls 07/21/2022    Fracture of femoral neck, right, closed (HCC) 01/17/2022   Herpes simplex 08/22/2017   Open fracture dislocation of right elbow joint 01/17/2022    Past Surgical History:  Procedure Laterality Date   NO PAST SURGERIES     SALPINGECTOMY     Family History: History reviewed. No pertinent family history.  Family Psychiatric  History: See H & P  Social History:  Social History   Substance and Sexual Activity  Alcohol Use Yes     Social History   Substance and Sexual Activity  Drug Use Yes   Types: Cocaine, Marijuana    Social History   Socioeconomic History   Marital status: Single    Spouse name: Not on file   Number of children: Not on file   Years of education: Not on file   Highest education level: Not on file  Occupational History   Not on file  Tobacco Use   Smoking status: Every Day   Smokeless tobacco: Not on file  Substance and Sexual Activity   Alcohol use: Yes   Drug use: Yes    Types: Cocaine, Marijuana   Sexual activity: Yes  Other Topics Concern   Not on file  Social History Narrative   Not on file   Social Determinants of Health   Financial Resource Strain: Low Risk  (09/12/2022)   Received from [name]   Overall Financial Resource Strain (CARDIA)    Difficulty of Paying Living Expenses: Not hard at all  Food Insecurity: Patient Declined (12/20/2022)   Hunger Vital Sign    Worried About Running Out of  Food in the Last Year: Patient declined    Ran Out of Food in the Last Year: Patient declined  Transportation Needs: No Transportation Needs (12/20/2022)   PRAPARE - Administrator, Civil Service (Medical): No    Lack of Transportation (Non-Medical): No  Physical Activity: Not on file  Stress: No Stress Concern Present (07/17/2022)   Received from [facility] Occupational Health - Occupational Stress Questionnaire    Feeling of Stress : Not at all  Social Connections: Unknown  (07/16/2022)   Received from [facility]   Social Network    Social Network: Not on file   Sleep: Good  Appetite:  Good  Current Medications: Current Facility-Administered Medications  Medication Dose Route Frequency Provider Last Rate Last Admin   acetaminophen (TYLENOL) tablet 650 mg  650 mg Oral Q6H PRN [name], NP   650 mg at 05/01/23 2054   alum & mag hydroxide-simeth (MAALOX/MYLANTA) 200-200-20 MG/5ML suspension 30 mL  30 mL Oral Q4H PRN [name], NP   30 mL at 01/28/23 1530   busPIRone (BUSPAR) tablet 15 mg  15 mg Oral BID [name], NP   15 mg at 05/05/23 0804   cyanocobalamin (VITAMIN B12) injection 1,000 mcg  1,000 mcg Intramuscular Q30 days Sarita Bottom, MD   1,000 mcg at 05/03/23 1257   diphenhydrAMINE (BENADRYL) capsule 50 mg  50 mg Oral TID PRN [name], NP   50 mg at 01/15/23 1211   Or   diphenhydrAMINE (BENADRYL) injection 50 mg  50 mg Intramuscular TID PRN [name], NP       docusate sodium (COLACE) capsule 100 mg  100 mg Oral Daily Nkwenti, Tyler Aas, NP   100 mg at 05/05/23 0804   feeding supplement (ENSURE ENLIVE / ENSURE PLUS) liquid 237 mL  237 mL Oral TID BM Nkwenti, Tyler Aas, NP   237 mL at 05/04/23 2115   haloperidol (HALDOL) tablet 5 mg  5 mg Oral TID PRN [name], NP   5 mg at 05/04/23 1837   Or   haloperidol lactate (HALDOL) injection 5 mg  5 mg Intramuscular TID PRN [name], NP       hydrocortisone cream 1 %   Topical BID [name], MD   1 Application at 05/05/23 0804   hydrOXYzine (ATARAX) tablet 25 mg  25 mg Oral TID PRN [name], DO   25 mg at 05/01/23 2053   LORazepam (ATIVAN) tablet 2 mg  2 mg Oral TID PRN [name], NP   2 mg at 01/15/23 1211   Or   LORazepam (ATIVAN) injection 2 mg  2 mg Intramuscular TID PRN [name], NP       magnesium hydroxide (MILK OF MAGNESIA) suspension 30 mL  30 mL Oral Daily PRN [name], NP   30 mL at 04/13/23 0814   melatonin tablet 5 mg  5 mg Oral  QHS Nkwenti, Doris, NP   5 mg at 05/04/23 2116   nicotine (NICODERM CQ - dosed in mg/24 hours) patch 21 mg  21 mg Transdermal Daily [name], DO   21 mg at 03/25/23 1610   nicotine polacrilex (NICORETTE) gum 2 mg  2 mg Oral PRN [name], MD   2 mg at 05/03/23 1257   paliperidone (INVEGA) 24 hr tablet 6 mg  6 mg Oral Daily [name], NP   6 mg at 05/04/23 2116   pantoprazole (PROTONIX)  EC tablet 40 mg  40 mg Oral Daily Nwoko, Nicole Kindred I, NP   40 mg at 05/05/23 0804   polyethylene glycol (MIRALAX / GLYCOLAX) packet 17 g  17 g Oral Daily PRN [name], NP   17 g at 04/13/23 1734   sertraline (ZOLOFT) tablet 150 mg  150 mg Oral Daily Massengill, Nathan, MD   150 mg at 05/05/23 0804   SUMAtriptan (IMITREX) tablet 25 mg  25 mg Oral BID PRN [name], NP   25 mg at 04/29/23 1714   traZODone (DESYREL) tablet 50 mg  50 mg Oral QHS Nkwenti, Tyler Aas, NP   50 mg at 05/04/23 2116   Vitamin D (Ergocalciferol) (DRISDOL) 1.25 MG (50000 UNIT) capsule 50,000 Units  50,000 Units Oral Q7 days [name], NP   50,000 Units at 04/27/23 1612   Lab Results:  No results found for this or any previous visit (from the past 48 hour(s)).      Blood Alcohol level:  Lab Results  Component Value Date   ETH <10 12/19/2022   ETH <10 11/21/2019   Metabolic Disorder Labs: Lab Results  Component Value Date   HGBA1C 4.4 (L) 12/21/2022   MPG 79.58 12/21/2022   No results found for: "PROLACTIN" Lab Results  Component Value Date   CHOL 218 (H) 12/21/2022   TRIG 81 12/21/2022   HDL 53 12/21/2022   CHOLHDL 4.1 12/21/2022   VLDL 16 12/21/2022   LDLCALC 149 (H) 12/21/2022   LDLCALC 110 (H) 03/13/2011   Physical Findings: AIMS: Facial and Oral Movements Muscles of Facial Expression: None Lips and Perioral Area: None Jaw: None Tongue: None,Extremity Movements Upper (arms, wrists, hands, fingers): None Lower (legs, knees, ankles, toes): None, Trunk Movements Neck, shoulders, hips: None,  Global Judgements Severity of abnormal movements overall : None Incapacitation due to abnormal movements: None Patient's awareness of abnormal movements: No Awareness, Dental Status Current problems with teeth and/or dentures?: No Does patient usually wear dentures?: No  CIWA:    COWS:    AIMS:0 Musculoskeletal: Strength & Muscle Tone: within normal limits Gait & Station: normal Patient leans: N/A  Psychiatric Specialty Exam:  Presentation  General Appearance:  Disheveled  Eye Contact: Limited to baseline Speech: Decreased amount, decreased tone and volume but at baseline Speech Volume: Decreased  Handedness: Right  Mood and Affect  Mood: Sad and depressed mainly secondary to being in the hospital with no place to go Affect: Blunt  Thought Process  Thought Processes: Coherent  Descriptions of Associations:Intact  Orientation:Full (Time, Place and Person)  Thought Content:Logical [place] History of Schizophrenia/Schizoaffective disorder:Yes  Duration of Psychotic Symptoms:Greater than six months  Hallucinations:Hallucinations: None     Ideas of Reference: None  Suicidal Thoughts:Suicidal Thoughts: No     Homicidal Thoughts:Homicidal Thoughts: No    Sensorium  Memory: Immediate Fair; Remote Fair; Recent Fair  Judgment: Fair  Insight: Fair  Art therapist  Concentration: Fair  Attention Span: Fair  Recall: [employer] of Knowledge: Fair  Language: Fair  Psychomotor Activity  Psychomotor Activity: Psychomotor Activity: Normal     Assets  Assets: Communication Skills; Desire for Improvement  Sleep  Sleep: Sleep: Good Number of Hours of Sleep: 7     Physical Exam: Physical Exam Vitals and nursing note reviewed.  Cardiovascular:     Rate and Rhythm: Normal rate.  Pulmonary:     Effort: Pulmonary effort is normal.  Genitourinary:    Comments: Deferred Musculoskeletal:        General:  Normal range of  motion.     Cervical back: Normal range of motion.  Skin:    General: Skin is warm and dry.  Neurological:     General: No focal deficit present.     Mental Status: She is alert and oriented to person, place, and time.  Psychiatric:        Mood and Affect: Mood normal.        Behavior: Behavior normal.        Thought Content: Thought content normal.    Review of Systems  Constitutional:  Negative for fever.  Respiratory:  Negative for cough, shortness of breath and wheezing.   Gastrointestinal:  Positive for abdominal pain and vomiting. Negative for constipation.  Genitourinary:        Pain to light touch on the R side  Neurological:  Negative for dizziness.  Psychiatric/Behavioral:  Negative for depression, hallucinations, memory loss, substance abuse and suicidal ideas. The patient is nervous/anxious and has insomnia.   All other systems reviewed and are negative.  Blood pressure 103/83, pulse 76, temperature 97.8 F (36.6 C), temperature source Oral, resp. rate 16, height 4\' 11"  (1.499 m), weight 63 kg, SpO2 99%. Body mass index is 28.05 kg/m.  Treatment Plan Summary: Daily contact with patient to assess and evaluate symptoms and progress in treatment and Medication management.   Will continue to monitor side pain, patient is reporting patient was sensitive to light touch on the area. At this time it may be 2/2 to patient constantly lying on that side. Patient not having urinary concerns currently.    Still waiting on safe disposition as patient would not be able to live independently given cognitive impairment. Recommend assisted living.  She has a meeting today on 05/02/2023 regarding her placement.   No medication side effects reported.  Because of urinary incontinence, recommended adult diapers to be worn.   Principal/active diagnoses:  Schizoaffective disorder, depressive type (HCC)  Active Problems: GERD (gastroesophageal reflux disease) Intellectual  disability Tobacco use disorder (MOCA 16/30) moderate cognitive impairment probably related to moderate intellectual disability.  Plan:  -Continue Imitrex 25 mg PRN BID for migraines -Continue Colace 100 mg daily for constipation -Continue Vitamin D 50.000 units weekly for bone health. -Continue Sertraline150 mg po Q daily for depression/anxiety -Continue Buspar 15 mg po bid for anxiety.  -Continue paliperidone 6 mg po qd for mood stabilization -Continue Trazodone 50 mg nightly for insomnia  -Continue Nicoderm 21 mg topically Q 24 hrs for nicotine withdrawal management. -Continue vitamin B12 1000 mcg IM weekly for 4 weeks then monthly -Continue Melatonin 5 mg nightly for sleep -Continue Protonix EC 40 mg p.o. daily for GERD -Continue MiraLAX 17 g p.o. PRN for constipation -Continue hydroxyzine 25 mg p.o. 3 times daily as needed for anxiety -Continue Ensure nutritional shakes TID in between meals  -Previously discontinued Hydroxyzine 50 mg nightly-hypotension in the mornings   Safety and Monitoring: Voluntary admission to inpatient psychiatric unit for safety, stabilization and treatment Daily contact with patient to assess and evaluate symptoms and progress in treatment Patient's case to be discussed in multi-disciplinary team meeting Observation Level : q15 minute checks Vital signs: q12 hours Precautions: Safety   Discharge Planning: Social work and case management to assist with discharge planning and identification of hospital follow-up needs prior to discharge Estimated LOS: Unknown at this time. Discharge Concerns: Need to establish a safety plan; Medication compliance and effectiveness Discharge Goals: Return home with outpatient referrals for mental health follow-up including medication  management/psychotherapy No legal guardian, has payee Total Time Spent in Direct Patient Care:   I personally spent 15 minutes on the unit in direct patient care. The direct patient care  time included face-to-face time with the patient, reviewing the patient's chart, communicating with other professionals, and coordinating care. Greater than 50% of this time was spent in counseling or coordinating care with the patient regarding goals of hospitalization, psycho-education, and discharge planning needs.   [name], MD,  05/05/2023, 11:06 AM Patient ID: Yolanda [name], female   DOB: 06/30/1976, 47 y.o.   MRN: [id number]

## 2023-05-05 NOTE — [facility] Group Notes (Signed)
[facility] Group Notes:  (Nursing/MHT/Case Management/Adjunct)  Date:  05/05/2023  Time:  2:34 PM  Type of Therapy:  Psychoeducational Skills  Participation Level:  Did Not Attend  Participation Quality:  na  Affect:  Cognitive:    Insight:    Engagement in Group:    Modes of Intervention:    Summary of Progress/Problems: pt did not attend psychoeducational RN group.  Yolanda [name] 05/05/2023, 2:34 PM

## 2023-05-05 NOTE — Plan of Care (Signed)
  Problem: Education: Goal: Utilization of techniques to improve thought processes will improve Outcome: Not Progressing Goal: Knowledge of the prescribed therapeutic regimen will improve Outcome: Not Progressing   Problem: Activity: Goal: Interest or engagement in leisure activities will improve Outcome: Not Progressing Goal: Imbalance in normal sleep/wake cycle will improve Outcome: Not Progressing   Problem: Coping: Goal: Coping ability will improve Outcome: Not Progressing Goal: Will verbalize feelings Outcome: Progressing

## 2023-05-05 NOTE — Progress Notes (Signed)
   05/05/23 1600  Psych Admission Type (Psych Patients Only)  Admission Status Involuntary  Psychosocial Assessment  Patient Complaints Depression  Eye Contact Fair  Facial Expression Animated  Affect Appropriate to circumstance  Speech Soft  Interaction Assertive;Childlike  Motor Activity Slow  Appearance/Hygiene Unremarkable  Behavior Characteristics Cooperative  Mood Labile  Aggressive Behavior  Effect No apparent injury  Thought Process  Coherency WDL  Content WDL  Delusions WDL  Perception WDL  Hallucination None reported or observed  Judgment Impaired  Confusion None  Danger to Self  Current suicidal ideation? Denies  Self-Injurious Behavior No self-injurious ideation or behavior indicators observed or expressed   Agreement Not to Harm Self Yes  Description of Agreement verbal  Danger to Others  Danger to Others None reported or observed

## 2023-05-06 DIAGNOSIS — F251 Schizoaffective disorder, depressive type: Secondary | ICD-10-CM | POA: Diagnosis not present

## 2023-05-06 NOTE — Progress Notes (Signed)
[facility] MD Progress Note  05/06/2023 10:12 AM Yolanda [name]  MRN:  [id number]  Principal Problem: Schizoaffective disorder, depressive type (HCC)  Diagnosis: Principal Problem:   Schizoaffective disorder, depressive type (HCC) Active Problems:   GERD (gastroesophageal reflux disease)   Intellectual disability   Tobacco use disorder  Reason for admission: This is the first psychiatric admission in this [facility] in 12 years for this 47 AA female with an extensive hx of mental illnesses & probable polysubstance use disorders. She is admitted to the [facility] from the [facility] hospital with complain of worsening suicidal ideations with plan to stab herself. Per chart review, patient apparently reported at the ED that she has been depressed for a while & has not been taking her mental health medications. After medical evaluation.clearance, she was transferred to the [facility] for further psychiatric evaluation/treatments.   Patient assessment note: Staff reports that the patient has been compliant with medications and treatment.  Night shift reported vague hallucinations periodically at night that is transient and not persistent.  Patient is not bothered by it.  She slept 7 hours.  Patient has been stable on medications and yesterday no changes were made.  When seen today, there is no significant change in the patient.  She reports that her hallucinations are not a major issue even though at night she thinks she hears something which could be noises from the hallway.  She continues to work on her GED.  Denies SI/HI/AVH. Awaiting basement. Total Time spent with patient:  20 minutes  Past Psychiatric History:  See H & P  Past Medical History:  Past Medical History:  Diagnosis Date   Bipolar affect, depressed (HCC)    Constipation 08/17/2022   Depression    Falls 07/21/2022   Fracture of femoral neck, right, closed (HCC) 01/17/2022   Herpes simplex 08/22/2017   Open fracture dislocation of right elbow  joint 01/17/2022    Past Surgical History:  Procedure Laterality Date   NO PAST SURGERIES     SALPINGECTOMY     Family History: History reviewed. No pertinent family history.  Family Psychiatric  History: See H & P  Social History:  Social History   Substance and Sexual Activity  Alcohol Use Yes     Social History   Substance and Sexual Activity  Drug Use Yes   Types: Cocaine, Marijuana    Social History   Socioeconomic History   Marital status: Single    Spouse name: Not on file   Number of children: Not on file   Years of education: Not on file   Highest education level: Not on file  Occupational History   Not on file  Tobacco Use   Smoking status: Every Day   Smokeless tobacco: Not on file  Substance and Sexual Activity   Alcohol use: Yes   Drug use: Yes    Types: Cocaine, Marijuana   Sexual activity: Yes  Other Topics Concern   Not on file  Social History Narrative   Not on file   Social Determinants of Health   Financial Resource Strain: Low Risk  (09/12/2022)   Received from [facility], [facility]   Overall Financial Resource Strain (CARDIA)    Difficulty of Paying Living Expenses: Not hard at all  Food Insecurity: Patient Declined (12/20/2022)   Hunger Vital Sign    Worried About Running Out of Food in the Last Year: Patient declined    Ran Out of Food in the Last Year: Patient declined  Transportation Needs: No Transportation Needs (12/20/2022)   PRAPARE - Administrator, Civil Service (Medical): No    Lack of Transportation (Non-Medical): No  Physical Activity: Not on file  Stress: No Stress Concern Present (07/17/2022)   Received from [facility], [facility] - Occupational Stress Questionnaire    Feeling of Stress : Not at all  Social Connections: Unknown (07/16/2022)   Received from [facility], [facility]   Social Network    Social Network: Not on file   Sleep:  Good  Appetite:  Good  Current Medications: Current Facility-Administered Medications  Medication Dose Route Frequency Provider Last Rate Last Admin   acetaminophen (TYLENOL) tablet 650 mg  650 mg Oral Q6H PRN [name], NP   650 mg at 05/01/23 2054   alum & mag hydroxide-simeth (MAALOX/MYLANTA) 200-200-20 MG/5ML suspension 30 mL  30 mL Oral Q4H PRN [name], NP   30 mL at 01/28/23 1530   busPIRone (BUSPAR) tablet 15 mg  15 mg Oral BID [name], NP   15 mg at 05/06/23 0841   cyanocobalamin (VITAMIN B12) injection 1,000 mcg  1,000 mcg Intramuscular Q30 days Sarita Bottom, MD   1,000 mcg at 05/03/23 1257   diphenhydrAMINE (BENADRYL) capsule 50 mg  50 mg Oral TID PRN [name], NP   50 mg at 01/15/23 1211   Or   diphenhydrAMINE (BENADRYL) injection 50 mg  50 mg Intramuscular TID PRN [name], NP       docusate sodium (COLACE) capsule 100 mg  100 mg Oral Daily Nkwenti, Tyler Aas, NP   100 mg at 05/06/23 0841   feeding supplement (ENSURE ENLIVE / ENSURE PLUS) liquid 237 mL  237 mL Oral TID BM Nkwenti, Doris, NP   237 mL at 05/05/23 2057   haloperidol (HALDOL) tablet 5 mg  5 mg Oral TID PRN [name], NP   5 mg at 05/04/23 1837   Or   haloperidol lactate (HALDOL) injection 5 mg  5 mg Intramuscular TID PRN [name], NP       hydrocortisone cream 1 %   Topical BID [name], MD   Given at 05/06/23 0841   hydrOXYzine (ATARAX) tablet 25 mg  25 mg Oral TID PRN [name], DO   25 mg at 05/01/23 2053   LORazepam (ATIVAN) tablet 2 mg  2 mg Oral TID PRN [name], NP   2 mg at 01/15/23 1211   Or   LORazepam (ATIVAN) injection 2 mg  2 mg Intramuscular TID PRN [name], NP       magnesium hydroxide (MILK OF MAGNESIA) suspension 30 mL  30 mL Oral Daily PRN [name], NP   30 mL at 04/13/23 0814   melatonin tablet 5 mg  5 mg Oral QHS Nkwenti, Doris, NP   5 mg at 05/05/23 2058   nicotine (NICODERM CQ - dosed in mg/24 hours) patch 21 mg  21 mg Transdermal  Daily [name], DO   21 mg at 03/25/23 1610   nicotine polacrilex (NICORETTE) gum 2 mg  2 mg Oral PRN [name], MD   2 mg at 05/03/23 1257   paliperidone (INVEGA) 24 hr tablet 6 mg  6 mg Oral Daily [name], NP   6 mg at 05/05/23 2058   pantoprazole (PROTONIX) EC tablet 40 mg  40 mg Oral Daily [name], NP   40 mg at 05/06/23 [phone]  polyethylene glycol (MIRALAX / GLYCOLAX) packet 17 g  17 g Oral Daily PRN [name], NP   17 g at 04/13/23 1734   sertraline (ZOLOFT) tablet 150 mg  150 mg Oral Daily Massengill, Nathan, MD   150 mg at 05/06/23 0843   SUMAtriptan (IMITREX) tablet 25 mg  25 mg Oral BID PRN [name], NP   25 mg at 04/29/23 1714   traZODone (DESYREL) tablet 50 mg  50 mg Oral QHS Nkwenti, Tyler Aas, NP   50 mg at 05/05/23 2058   Vitamin D (Ergocalciferol) (DRISDOL) 1.25 MG (50000 UNIT) capsule 50,000 Units  50,000 Units Oral Q7 days [name], NP   50,000 Units at 04/27/23 1612   Lab Results:  No results found for this or any previous visit (from the past 48 hour(s)).      Blood Alcohol level:  Lab Results  Component Value Date   ETH <10 12/19/2022   ETH <10 11/21/2019   Metabolic Disorder Labs: Lab Results  Component Value Date   HGBA1C 4.4 (L) 12/21/2022   MPG 79.58 12/21/2022   No results found for: "PROLACTIN" Lab Results  Component Value Date   CHOL 218 (H) 12/21/2022   TRIG 81 12/21/2022   HDL 53 12/21/2022   CHOLHDL 4.1 12/21/2022   VLDL 16 12/21/2022   LDLCALC 149 (H) 12/21/2022   LDLCALC 110 (H) 03/13/2011   Physical Findings: AIMS: Facial and Oral Movements Muscles of Facial Expression: None Lips and Perioral Area: None Jaw: None Tongue: None,Extremity Movements Upper (arms, wrists, hands, fingers): None Lower (legs, knees, ankles, toes): None, Trunk Movements Neck, shoulders, hips: None, Global Judgements Severity of abnormal movements overall : None Incapacitation due to abnormal movements: None Patient's  awareness of abnormal movements: No Awareness, Dental Status Current problems with teeth and/or dentures?: No Does patient usually wear dentures?: No  CIWA:    COWS:    AIMS:0 Musculoskeletal: Strength & Muscle Tone: within normal limits Gait & Station: normal Patient leans: N/A  Psychiatric Specialty Exam:  Presentation  General Appearance:  Casual; Disheveled  Eye Contact: Limited to baseline Speech: Decreased amount, decreased tone and volume but at baseline Speech Volume: Decreased  Handedness: Right  Mood and Affect  Mood: Sad and depressed mainly secondary to being in the hospital with no place to go Affect: Blunt; Restricted  Thought Process  Thought Processes: Linear  Descriptions of Associations:Intact  Orientation:Full (Time, Place and Person)  Thought Content:Rumination Concrete History of Schizophrenia/Schizoaffective disorder:Yes  Duration of Psychotic Symptoms:Greater than six months  Hallucinations:Hallucinations: Other (comment) Description of Auditory Hallucinations: Staff reports that occasionally she complains of hearing voices or whispering at night but seems to be doing well in the daytime.     Ideas of Reference: None  Suicidal Thoughts:Suicidal Thoughts: No     Homicidal Thoughts:Homicidal Thoughts: No    Sensorium  Memory: Immediate Fair; Recent Poor; Remote Fair  Judgment: Fair  Insight: Fair  Chartered certified accountant: Fair  Attention Span: Fair  Recall: [employer] of Knowledge: Fair  Language: Fair  Psychomotor Activity  Psychomotor Activity: Psychomotor Activity: Normal     Assets  Assets: Manufacturing systems engineer; Desire for Improvement  Sleep  Sleep: Sleep: Fair Number of Hours of Sleep: 7     Physical Exam: Physical Exam Vitals and nursing note reviewed.  Cardiovascular:     Rate and Rhythm: Normal rate.  Pulmonary:     Effort: Pulmonary effort is normal.   Genitourinary:    Comments: Deferred Musculoskeletal:  General: Normal range of motion.     Cervical back: Normal range of motion.  Skin:    General: Skin is warm and dry.  Neurological:     General: No focal deficit present.     Mental Status: She is alert and oriented to person, place, and time.  Psychiatric:        Mood and Affect: Mood normal.        Behavior: Behavior normal.        Thought Content: Thought content normal.    Review of Systems  Constitutional:  Negative for fever.  Respiratory:  Negative for cough, shortness of breath and wheezing.   Gastrointestinal:  Positive for abdominal pain and vomiting. Negative for constipation.  Genitourinary:        Pain to light touch on the R side  Neurological:  Negative for dizziness.  Psychiatric/Behavioral:  Negative for depression, hallucinations, memory loss, substance abuse and suicidal ideas. The patient is nervous/anxious and has insomnia.   All other systems reviewed and are negative.  Blood pressure 103/61, pulse (!) 110, temperature 98 F (36.7 C), temperature source Oral, resp. rate 16, height 4\' 11"  (1.499 m), weight 63 kg, SpO2 100%. Body mass index is 28.05 kg/m.  Treatment Plan Summary: Daily contact with patient to assess and evaluate symptoms and progress in treatment and Medication management.   Will continue to monitor side pain, patient is reporting patient was sensitive to light touch on the area. At this time it may be 2/2 to patient constantly lying on that side. Patient not having urinary concerns currently.    Still waiting on safe disposition as patient would not be able to live independently given cognitive impairment. Recommend assisted living.  She has a meeting today on 05/02/2023 regarding her placement.   No medication side effects reported.  Because of urinary incontinence, recommended adult diapers to be worn.   Principal/active diagnoses:  Schizoaffective disorder, depressive type  (HCC)  Active Problems: GERD (gastroesophageal reflux disease) Intellectual disability Tobacco use disorder (MOCA 16/30) moderate cognitive impairment probably related to moderate intellectual disability.  Plan:  -Continue Imitrex 25 mg PRN BID for migraines -Continue Colace 100 mg daily for constipation -Continue Vitamin D 50.000 units weekly for bone health. -Continue Sertraline150 mg po Q daily for depression/anxiety -Continue Buspar 15 mg po bid for anxiety.  -Continue paliperidone 6 mg po qd for mood stabilization -Continue Trazodone 50 mg nightly for insomnia  -Continue Nicoderm 21 mg topically Q 24 hrs for nicotine withdrawal management. -Continue vitamin B12 1000 mcg IM weekly for 4 weeks then monthly -Continue Melatonin 5 mg nightly for sleep -Continue Protonix EC 40 mg p.o. daily for GERD -Continue MiraLAX 17 g p.o. PRN for constipation -Continue hydroxyzine 25 mg p.o. 3 times daily as needed for anxiety -Continue Ensure nutritional shakes TID in between meals  -Previously discontinued Hydroxyzine 50 mg nightly-hypotension in the mornings   Safety and Monitoring: Voluntary admission to inpatient psychiatric unit for safety, stabilization and treatment Daily contact with patient to assess and evaluate symptoms and progress in treatment Patient's case to be discussed in multi-disciplinary team meeting Observation Level : q15 minute checks Vital signs: q12 hours Precautions: Safety   Discharge Planning: Social work and case management to assist with discharge planning and identification of hospital follow-up needs prior to discharge Estimated LOS: Unknown at this time. Discharge Concerns: Need to establish a safety plan; Medication compliance and effectiveness Discharge Goals: Return home with outpatient referrals for mental health follow-up including  medication management/psychotherapy No legal guardian, has payee Total Time Spent in Direct Patient Care:   I  personally spent 15 minutes on the unit in direct patient care. The direct patient care time included face-to-face time with the patient, reviewing the patient's chart, communicating with other professionals, and coordinating care. Greater than 50% of this time was spent in counseling or coordinating care with the patient regarding goals of hospitalization, psycho-education, and discharge planning needs.   [name], MD,  05/06/2023, 10:12 AM Patient ID: Yolanda [name], female   DOB: Nov 02, 1975, 47 y.o.   MRN: [id number]   Patient ID: Yolanda [name], female   DOB: 1975-09-10, 47 y.o.   MRN: [id number]

## 2023-05-06 NOTE — Progress Notes (Signed)
   05/05/23 2027  Psych Admission Type (Psych Patients Only)  Admission Status Involuntary  Psychosocial Assessment  Patient Complaints Depression  Eye Contact Fair  Facial Expression Animated  Affect Appropriate to circumstance  Speech Soft  Interaction Assertive;Childlike  Motor Activity Slow  Appearance/Hygiene Unremarkable  Behavior Characteristics Cooperative  Mood Pleasant  Thought Process  Coherency WDL  Content WDL  Delusions WDL  Perception WDL  Hallucination None reported or observed  Judgment Impaired  Confusion None  Danger to Self  Current suicidal ideation? Denies  Agreement Not to Harm Self Yes  Description of Agreement verbal  Danger to Others  Danger to Others None reported or observed

## 2023-05-06 NOTE — Progress Notes (Signed)
D:  Patient denied SI and HI, contracts for safety.  Denied A/V hallucinations.  Denied pain. A:  Medications administered per MD orders.  Emotional support and encouragement given patient. R:  Safety maintained with 15 minute checks.  

## 2023-05-06 NOTE — Group Note (Signed)
Recreation Therapy Group Note   Group Topic:Problem Solving  Group Date: 05/06/2023 Start Time: 0935 End Time: 1000 Facilitators: Rochelle Nephew-McCall, LRT,CTRS Location: [address] Dayroom   Goal Area(s) Addresses:  Patient will effectively work with peer towards shared goal.  Patient will identify skills used to make activity successful.  Patient will identify how skills used during activity can be used to reach post d/c goals.   Intervention: STEM Activity  Group Description: Stage manager. In teams of 3-5, patients were given 12 plastic drinking straws and an equal length of masking tape. Using the materials provided, patients were asked to build a landing pad to catch a golf ball dropped from approximately 5 feet in the air. All materials were required to be used by the team in their design. LRT facilitated post-activity discussion.  Education: Pharmacist, community, Scientist, physiological, Discharge Planning   Education Outcome: Acknowledges education/In group clarification offered/Needs additional education.    Affect/Mood: N/A   Participation Level: Did not attend    Clinical Observations/Individualized Feedback:    Plan: Continue to engage patient in RT group sessions 2-3x/week.   Lisaanne Lawrie-McCall, LRT,CTRS  05/06/2023 12:17 PM

## 2023-05-06 NOTE — [facility] Group Notes (Signed)
[facility] Group Notes:  (Nursing/MHT/Case Management/Adjunct)  Date:  05/06/2023  Time:  9:30 PM  Type of Therapy:  Group Therapy  Participation Level:  Did Not Attend  Participation Quality:  Resistant  Affect:  Resistant  Cognitive:  Lacking  Insight:  None  Engagement in Group:  None  Modes of Intervention:  Education  Summary of Progress/Problems: Patient did not attend group this evening.   Yolanda [name] 05/06/2023, 9:30 PM

## 2023-05-06 NOTE — Group Note (Signed)
Date:  05/06/2023 Time:  9:27 PM  Group Topic/Focus:  Wrap-Up Group:   The focus of this group is to help patients review their daily goal of treatment and discuss progress on daily workbooks.    Participation Level:  Did Not Attend  Participation Quality:   N/A  Affect:   N/A  Cognitive:   N/A  Insight: None  Engagement in Group:   N/A  Modes of Intervention:   N/A  Additional Comments:  Patient did not attend wrap up group.   Kennieth Francois 05/06/2023, 9:27 PM

## 2023-05-06 NOTE — Plan of Care (Signed)
Nurse discussed anxiety, depression and coping skills with patient.  

## 2023-05-06 NOTE — BHH Group Notes (Signed)

## 2023-05-06 NOTE — Group Note (Signed)
Occupational Therapy Group Note  Group Topic: Sleep Hygiene  Group Date: 05/06/2023 Start Time: 1430 End Time: 1500 Facilitators: Ted Mcalpine, OT   Group Description: Group encouraged increased participation and engagement through topic focused on sleep hygiene. Patients reflected on the quality of sleep they typically receive and identified areas that need improvement. Group was given background information on sleep and sleep hygiene, including common sleep disorders. Group members also received information on how to improve one's sleep and introduced a sleep diary as a tool that can be utilized to track sleep quality over a length of time. Group session ended with patients identifying one or more strategies they could utilize or implement into their sleep routine in order to improve overall sleep quality.        Therapeutic Goal(s):  Identify one or more strategies to improve overall sleep hygiene  Identify one or more areas of sleep that are negatively impacted (sleep too much, too little, etc)     Participation Level: Engaged   Participation Quality: Independent   Behavior: Appropriate   Speech/Thought Process: Relevant   Affect/Mood: Appropriate   Insight: Limited   Judgement: Limited      Modes of Intervention: Education  Patient Response to Interventions:  Attentive   Plan: Continue to engage patient in OT groups 2 - 3x/week.  05/06/2023  Ted Mcalpine, OT  Kerrin Champagne, OT

## 2023-05-06 NOTE — Group Note (Unsigned)
Date:  05/06/2023 Time:  10:50 AM  Group Topic/Focus:  Goals Group:   The focus of this group is to help patients establish daily goals to achieve during treatment and discuss how the patient can incorporate goal setting into their daily lives to aide in recovery.     Participation Level:  {B[facility] PARTICIPATION UXNAT:55732}  Participation Quality:  {B[facility] PARTICIPATION QUALITY:22265}  Affect:  {BHH AFFECT:22266}  Cognitive:  {B[facility] COGNITIVE:22267}  Insight: {BHH Insight2:20797}  Engagement in Group:  {BHH ENGAGEMENT IN KGURK:27062}  Modes of Intervention:  {BHH MODES OF INTERVENTION:22269}  Additional Comments:  ***  [name] 05/06/2023, 10:50 AM

## 2023-05-06 NOTE — Group Note (Unsigned)
Date:  05/06/2023 Time:  4:28 PM  Group Topic/Focus:  Healthy Communication:   The focus of this group is to discuss communication, barriers to communication, as well as healthy ways to communicate with others.     Participation Level:  {B[facility] PARTICIPATION WGNFA:21308}  Participation Quality:  {B[facility] PARTICIPATION QUALITY:22265}  Affect:  {BHH AFFECT:22266}  Cognitive:  {B[facility] COGNITIVE:22267}  Insight: {BHH Insight2:20797}  Engagement in Group:  {BHH ENGAGEMENT IN MVHQI:69629}  Modes of Intervention:  {BHH MODES OF INTERVENTION:22269}  Additional Comments:  ***  [name] 05/06/2023, 4:28 PM

## 2023-05-07 DIAGNOSIS — F251 Schizoaffective disorder, depressive type: Secondary | ICD-10-CM | POA: Diagnosis not present

## 2023-05-07 NOTE — Group Note (Signed)
Date:  05/07/2023 Time:  9:35 AM  Group Topic/Focus:  Goals Group:   The focus of this group is to help patients establish daily goals to achieve during treatment and discuss how the patient can incorporate goal setting into their daily lives to aide in recovery.    Participation Level:  Active  Participation Quality:  Appropriate  Affect:  Appropriate  Cognitive:  Appropriate  Insight: Appropriate  Engagement in Group:  Engaged  Modes of Intervention:  Discussion  Additional Comments:    Yolanda [name] D [name] 05/07/2023, 9:35 AM

## 2023-05-07 NOTE — Progress Notes (Signed)
   05/06/23 2113  Psych Admission Type (Psych Patients Only)  Admission Status Involuntary  Psychosocial Assessment  Patient Complaints Worrying  Eye Contact Fair  Facial Expression Animated  Affect Appropriate to circumstance  Speech Logical/coherent;Soft  Interaction Assertive;Childlike  Motor Activity Slow;Fidgety  Appearance/Hygiene Unremarkable  Behavior Characteristics Cooperative  Mood Pleasant  Thought Process  Coherency WDL  Content WDL  Delusions None reported or observed  Perception WDL  Hallucination None reported or observed  Judgment Impaired  Confusion None  Danger to Self  Current suicidal ideation? Denies  Agreement Not to Harm Self Yes  Description of Agreement verbal  Danger to Others  Danger to Others None reported or observed

## 2023-05-07 NOTE — Progress Notes (Signed)
   05/07/23 0558  15 Minute Checks  Location Bedroom  Visual Appearance Calm  Behavior Sleeping  Sleep (Behavioral Health Patients Only)  Calculate sleep? (Click Yes once per 24 hr at 0600 safety check) Yes  Documented sleep last 24 hours 8

## 2023-05-07 NOTE — Plan of Care (Signed)
  Problem: Coping: Goal: Will verbalize feelings Outcome: Progressing   Problem: Medication: Goal: Compliance with prescribed medication regimen will improve Outcome: Progressing   Problem: Safety: Goal: Periods of time without injury will increase Outcome: Progressing

## 2023-05-07 NOTE — Group Note (Signed)
Recreation Therapy Group Note   Group Topic:Animal Assisted Therapy   Group Date: 05/07/2023 Start Time: 0945 End Time: 1030 Facilitators: Scarlette Hogston-McCall, LRT,CTRS Location: [address] Dayroom   Animal-Assisted Activity (AAA) Program Checklist/Progress Notes Patient Eligibility Criteria Checklist & Daily Group note for Rec Tx Intervention  AAA/T Program Assumption of Risk Form signed by Patient/ or Parent Legal Guardian Yes  Patient understands his/her participation is voluntary Yes  Education: Charity fundraiser, Appropriate Animal Interaction   Education Outcome: Acknowledges education.    Clinical Observations/Individualized Feedback: Pet therapy did not occur today due to therapy dog being under the weather. Pet therapy with resume next week at original time.    Plan: Continue to engage patient in RT group sessions 2-3x/week.   Yolanda [name], LRT,CTRS 05/07/2023 12:36 PM

## 2023-05-07 NOTE — Progress Notes (Signed)
[facility] MD Progress Note  05/07/2023 11:25 AM Yolanda [name]  MRN:  [id number]  Principal Problem: Schizoaffective disorder, depressive type (HCC)  Diagnosis: Principal Problem:   Schizoaffective disorder, depressive type (HCC) Active Problems:   GERD (gastroesophageal reflux disease)   Intellectual disability   Tobacco use disorder  Reason for admission: This is the first psychiatric admission in this [name] in 12 years for this 43 AA female with an extensive hx of mental illnesses & probable polysubstance use disorders. She is admitted to the [facility] from the [facility] hospital with complain of worsening suicidal ideations with plan to stab herself. Per chart review, patient apparently reported at the ED that she has been depressed for a while & has not been taking her mental health medications. After medical evaluation.clearance, she was transferred to the [facility] for further psychiatric evaluation/treatments.   Patient assessment note: Staff reports that the patient has been compliant with medications and treatment.  Patient reports fair sleep and appetite, no irritability or agitation, denies depressed mood or feeling hopeless or helpless.  Patient has been stable on medications and denies Se to meds, she reports she only feels anxious wanting to know when she will be able " to go home" d/w patient placement in process at this time with no available updates.  She continues to work on her GED.  Denies SI/HI/AVH. Awaiting placement Total Time spent with patient:  25 minutes  Past Psychiatric History:  See H & P  Past Medical History:  Past Medical History:  Diagnosis Date   Bipolar affect, depressed (HCC)    Constipation 08/17/2022   Depression    Falls 07/21/2022   Fracture of femoral neck, right, closed (HCC) 01/17/2022   Herpes simplex 08/22/2017   Open fracture dislocation of right elbow joint 01/17/2022    Past Surgical History:  Procedure Laterality Date   NO PAST SURGERIES      SALPINGECTOMY     Family History: History reviewed. No pertinent family history.  Family Psychiatric  History: See H & P  Social History:  Social History   Substance and Sexual Activity  Alcohol Use Yes     Social History   Substance and Sexual Activity  Drug Use Yes   Types: Cocaine, Marijuana    Social History   Socioeconomic History   Marital status: Single    Spouse name: Not on file   Number of children: Not on file   Years of education: Not on file   Highest education level: Not on file  Occupational History   Not on file  Tobacco Use   Smoking status: Every Day   Smokeless tobacco: Not on file  Substance and Sexual Activity   Alcohol use: Yes   Drug use: Yes    Types: Cocaine, Marijuana   Sexual activity: Yes  Other Topics Concern   Not on file  Social History Narrative   Not on file   Social Determinants of Health   Financial Resource Strain: Low Risk  (09/12/2022)   Received from [facility], [facility]   Overall Financial Resource Strain (CARDIA)    Difficulty of Paying Living Expenses: Not hard at all  Food Insecurity: Patient Declined (12/20/2022)   Hunger Vital Sign    Worried About Running Out of Food in the Last Year: Patient declined    Ran Out of Food in the Last Year: Patient declined  Transportation Needs: No Transportation Needs (12/20/2022)   PRAPARE - Administrator, Civil Service (  Medical): No    Lack of Transportation (Non-Medical): No  Physical Activity: Not on file  Stress: No Stress Concern Present (07/17/2022)   Received from [facility], [facility] - Occupational Stress Questionnaire    Feeling of Stress : Not at all  Social Connections: Unknown (07/16/2022)   Received from [facility], [facility]   Social Network    Social Network: Not on file   Sleep: Good  Appetite:  Good  Current Medications: Current Facility-Administered Medications  Medication Dose  Route Frequency Provider Last Rate Last Admin   acetaminophen (TYLENOL) tablet 650 mg  650 mg Oral Q6H PRN [name], NP   650 mg at 05/06/23 2113   alum & mag hydroxide-simeth (MAALOX/MYLANTA) 200-200-20 MG/5ML suspension 30 mL  30 mL Oral Q4H PRN [name], NP   30 mL at 01/28/23 1530   busPIRone (BUSPAR) tablet 15 mg  15 mg Oral BID [name], NP   15 mg at 05/07/23 7829   cyanocobalamin (VITAMIN B12) injection 1,000 mcg  1,000 mcg Intramuscular Q30 days Sarita Bottom, MD   1,000 mcg at 05/03/23 1257   diphenhydrAMINE (BENADRYL) capsule 50 mg  50 mg Oral TID PRN [name], NP   50 mg at 01/15/23 1211   Or   diphenhydrAMINE (BENADRYL) injection 50 mg  50 mg Intramuscular TID PRN [name], NP       docusate sodium (COLACE) capsule 100 mg  100 mg Oral Daily Nkwenti, Tyler Aas, NP   100 mg at 05/07/23 0909   feeding supplement (ENSURE ENLIVE / ENSURE PLUS) liquid 237 mL  237 mL Oral TID BM Nkwenti, Doris, NP   237 mL at 05/07/23 0912   haloperidol (HALDOL) tablet 5 mg  5 mg Oral TID PRN [name], NP   5 mg at 05/04/23 1837   Or   haloperidol lactate (HALDOL) injection 5 mg  5 mg Intramuscular TID PRN [name], NP       hydrocortisone cream 1 %   Topical BID [name], MD   Given at 05/07/23 5621   hydrOXYzine (ATARAX) tablet 25 mg  25 mg Oral TID PRN [name], DO   25 mg at 05/01/23 2053   LORazepam (ATIVAN) tablet 2 mg  2 mg Oral TID PRN [name], NP   2 mg at 01/15/23 1211   Or   LORazepam (ATIVAN) injection 2 mg  2 mg Intramuscular TID PRN [name], NP       magnesium hydroxide (MILK OF MAGNESIA) suspension 30 mL  30 mL Oral Daily PRN [name], NP   30 mL at 04/13/23 0814   melatonin tablet 5 mg  5 mg Oral QHS Nkwenti, Doris, NP   5 mg at 05/06/23 2113   nicotine (NICODERM CQ - dosed in mg/24 hours) patch 21 mg  21 mg Transdermal Daily [name], DO   21 mg at 03/25/23 3086   nicotine polacrilex (NICORETTE) gum 2 mg  2 mg Oral PRN  [name], MD   2 mg at 05/06/23 1713   paliperidone (INVEGA) 24 hr tablet 6 mg  6 mg Oral Daily [name], NP   6 mg at 05/06/23 2113   pantoprazole (PROTONIX) EC tablet 40 mg  40 mg Oral Daily [name], NP   40 mg at 05/07/23 0909   polyethylene glycol (MIRALAX / GLYCOLAX) packet 17 g  17 g Oral Daily PRN [name], NP  17 g at 04/13/23 1734   sertraline (ZOLOFT) tablet 150 mg  150 mg Oral Daily Massengill, Nathan, MD   150 mg at 05/07/23 4098   SUMAtriptan (IMITREX) tablet 25 mg  25 mg Oral BID PRN [name], NP   25 mg at 04/29/23 1714   traZODone (DESYREL) tablet 50 mg  50 mg Oral QHS [name], NP   50 mg at 05/06/23 2113   Vitamin D (Ergocalciferol) (DRISDOL) 1.25 MG (50000 UNIT) capsule 50,000 Units  50,000 Units Oral Q7 days [name], NP   50,000 Units at 04/27/23 1612   Lab Results:  No results found for this or any previous visit (from the past 48 hour(s)).      Blood Alcohol level:  Lab Results  Component Value Date   ETH <10 12/19/2022   ETH <10 11/21/2019   Metabolic Disorder Labs: Lab Results  Component Value Date   HGBA1C 4.4 (L) 12/21/2022   MPG 79.58 12/21/2022   No results found for: "PROLACTIN" Lab Results  Component Value Date   CHOL 218 (H) 12/21/2022   TRIG 81 12/21/2022   HDL 53 12/21/2022   CHOLHDL 4.1 12/21/2022   VLDL 16 12/21/2022   LDLCALC 149 (H) 12/21/2022   LDLCALC 110 (H) 03/13/2011   Physical Findings: AIMS: Facial and Oral Movements Muscles of Facial Expression: None Lips and Perioral Area: None Jaw: None Tongue: None,Extremity Movements Upper (arms, wrists, hands, fingers): None Lower (legs, knees, ankles, toes): None, Trunk Movements Neck, shoulders, hips: None, Global Judgements Severity of abnormal movements overall : None Incapacitation due to abnormal movements: None Patient's awareness of abnormal movements: No Awareness, Dental Status Current problems with teeth and/or dentures?:  No Does patient usually wear dentures?: No  CIWA:    COWS:    AIMS:0 Musculoskeletal: Strength & Muscle Tone: within normal limits Gait & Station: normal Patient leans: N/A  Psychiatric Specialty Exam:  Presentation  General Appearance:  Casual; Disheveled  Eye Contact: Limited to baseline Speech: Decreased amount, decreased tone and volume but at baseline Speech Volume: Decreased  Handedness: Right  Mood and Affect  Mood: Sad and depressed mainly secondary to being in the hospital with no place to go Affect: Blunt; Restricted  Thought Process  Thought Processes: Linear  Descriptions of Associations:Intact  Orientation:Full (Time, Place and Person)  Thought Content:Rumination Concrete History of Schizophrenia/Schizoaffective disorder:Yes  Duration of Psychotic Symptoms:Greater than six months  Hallucinations:Hallucinations: Other (comment) Description of Auditory Hallucinations: Staff reports that occasionally she complains of hearing voices or whispering at night but seems to be doing well in the daytime.     Ideas of Reference: None  Suicidal Thoughts:Suicidal Thoughts: No     Homicidal Thoughts:Homicidal Thoughts: No    Sensorium  Memory: Immediate Fair; Recent Poor; Remote Fair  Judgment: Fair  Insight: Fair  Chartered certified accountant: Fair  Attention Span: Fair  Recall: [employer] of Knowledge: Fair  Language: Fair  Psychomotor Activity  Psychomotor Activity: Psychomotor Activity: Normal     Assets  Assets: Manufacturing systems engineer; Desire for Improvement  Sleep  Sleep: Sleep: Fair Number of Hours of Sleep: 7     Physical Exam: Physical Exam Vitals and nursing note reviewed.  Cardiovascular:     Rate and Rhythm: Normal rate.  Pulmonary:     Effort: Pulmonary effort is normal.  Genitourinary:    Comments: Deferred Musculoskeletal:        General: Normal range of motion.     Cervical back:  Normal  range of motion.  Skin:    General: Skin is warm and dry.  Neurological:     General: No focal deficit present.     Mental Status: She is alert and oriented to person, place, and time.  Psychiatric:        Mood and Affect: Mood normal.        Behavior: Behavior normal.        Thought Content: Thought content normal.    Review of Systems  Constitutional:  Negative for fever.  Respiratory:  Negative for cough, shortness of breath and wheezing.   Gastrointestinal:  Positive for abdominal pain and vomiting. Negative for constipation.  Genitourinary:        Pain to light touch on the R side  Neurological:  Negative for dizziness.  Psychiatric/Behavioral:  Negative for depression, hallucinations, memory loss, substance abuse and suicidal ideas. The patient is nervous/anxious and has insomnia.   All other systems reviewed and are negative.  Blood pressure (!) 90/48, pulse 60, temperature 97.6 F (36.4 C), temperature source Oral, resp. rate 16, height 4\' 11"  (1.499 m), weight 63 kg, SpO2 97%. Body mass index is 28.05 kg/m.  Treatment Plan Summary: Daily contact with patient to assess and evaluate symptoms and progress in treatment and Medication management.   Will continue to monitor side pain, patient is reporting patient was sensitive to light touch on the area. At this time it may be 2/2 to patient constantly lying on that side. Patient not having urinary concerns currently.    Still waiting on safe disposition as patient would not be able to live independently given cognitive impairment. Recommend assisted living.  She has a meeting today on 05/02/2023 regarding her placement.   No medication side effects reported.  Because of urinary incontinence, recommended adult diapers to be worn.   Principal/active diagnoses:  Schizoaffective disorder, depressive type (HCC)  Active Problems: GERD (gastroesophageal reflux disease) Intellectual disability Tobacco use disorder (MOCA  16/30) moderate cognitive impairment probably related to moderate intellectual disability.  Plan:  -Continue Imitrex 25 mg PRN BID for migraines -Continue Colace 100 mg daily for constipation -Continue Vitamin D 50.000 units weekly for bone health. -Continue Sertraline150 mg po Q daily for depression/anxiety -Continue Buspar 15 mg po bid for anxiety.  -Continue paliperidone 6 mg po qd for mood stabilization -Continue Trazodone 50 mg nightly for insomnia  -Continue Nicoderm 21 mg topically Q 24 hrs for nicotine withdrawal management. -Continue vitamin B12 1000 mcg IM weekly for 4 weeks then monthly -Continue Melatonin 5 mg nightly for sleep -Continue Protonix EC 40 mg p.o. daily for GERD -Continue MiraLAX 17 g p.o. PRN for constipation -Continue hydroxyzine 25 mg p.o. 3 times daily as needed for anxiety -Continue Ensure nutritional shakes TID in between meals  -Previously discontinued Hydroxyzine 50 mg nightly-hypotension in the mornings   Safety and Monitoring: Voluntary admission to inpatient psychiatric unit for safety, stabilization and treatment Daily contact with patient to assess and evaluate symptoms and progress in treatment Patient's case to be discussed in multi-disciplinary team meeting Observation Level : q15 minute checks Vital signs: q12 hours Precautions: Safety   Discharge Planning: Social work and case management to assist with discharge planning and identification of hospital follow-up needs prior to discharge Estimated LOS: Unknown at this time. Discharge Concerns: Need to establish a safety plan; Medication compliance and effectiveness Discharge Goals: Return home with outpatient referrals for mental health follow-up including medication management/psychotherapy No legal guardian, has payee Total Time Spent in Direct  Patient Care:   I personally spent 25 minutes on the unit in direct patient care. The direct patient care time included face-to-face time with the  patient, reviewing the patient's chart, communicating with other professionals, and coordinating care. Greater than 50% of this time was spent in counseling or coordinating care with the patient regarding goals of hospitalization, psycho-education, and discharge planning needs.   Audia Amick [name], MD,  05/07/2023, 11:25 AM

## 2023-05-07 NOTE — Progress Notes (Signed)
   05/07/23 1200  Psych Admission Type (Psych Patients Only)  Admission Status Involuntary  Psychosocial Assessment  Patient Complaints Depression;Crying spells  Eye Contact Fair  Facial Expression Animated  Affect Appropriate to circumstance  Speech Logical/coherent;Soft  Interaction Assertive;Childlike  Motor Activity Slow  Appearance/Hygiene Unremarkable  Behavior Characteristics Cooperative  Mood Pleasant  Thought Process  Coherency WDL  Content WDL  Delusions None reported or observed  Perception WDL  Hallucination None reported or observed  Judgment Impaired  Confusion None  Danger to Self  Current suicidal ideation? Denies  Self-Injurious Behavior No self-injurious ideation or behavior indicators observed or expressed   Agreement Not to Harm Self Yes  Description of Agreement verbal  Danger to Others  Danger to Others None reported or observed

## 2023-05-07 NOTE — Group Note (Unsigned)
Date:  05/08/2023 Time:  4:19 AM  Group Topic/Focus:  Wrap-Up Group:   The focus of this group is to help patients review their daily goal of treatment and discuss progress on daily workbooks.    Participation Level:  Active  Participation Quality:  Appropriate and Sharing  Affect:  Appropriate  Cognitive:  Appropriate  Insight: Appropriate and Limited  Engagement in Group:  Engaged and Limited  Modes of Intervention:  Activity and Socialization  Additional Comments:  The patient stated that she was "okay" and that she had an "off and on" type of day. The patient did not elaborate to writer on why. The patient rated her day a 5/10. There was some participation when engaging in the group activity at the end of the group.   Kennieth Francois 05/08/2023, 4:19 AM

## 2023-05-07 NOTE — Group Note (Signed)
[facility] LCSW Group Therapy Note   Group Date: 05/07/2023 Start Time: 1055 End Time: 1145  Type of Therapy/Topic:  Group Therapy:  Feelings about Diagnosis  Participation Level:  Active   Mood: Pleasant   Description of Group:    This group will allow patients to explore their thoughts and feelings about diagnoses they have received. Patients will be guided to explore their level of understanding and acceptance of these diagnoses. Facilitator will encourage patients to process their thoughts and feelings about the reactions of others to their diagnosis, and will guide patients in identifying ways to discuss their diagnosis with significant others in their lives. This group will be process-oriented, with patients participating in exploration of their own experiences as well as giving and receiving support and challenge from other group members.   Therapeutic Goals: 1. Patient will demonstrate understanding of diagnosis as evidence by identifying two or more symptoms of the disorder:  2. Patient will be able to express two feelings regarding the diagnosis 3. Patient will demonstrate ability to communicate their needs through discussion and/or role plays  Summary of Patient Progress:  Pt was engaged and provided good insight    Therapeutic Modalities:   Cognitive Behavioral Therapy Brief Therapy Feelings Identification    Yolanda [name] S Meaghan Whistler, LCSW

## 2023-05-08 DIAGNOSIS — F251 Schizoaffective disorder, depressive type: Secondary | ICD-10-CM | POA: Diagnosis not present

## 2023-05-08 NOTE — Plan of Care (Signed)
  Problem: Education: Goal: Knowledge of the prescribed therapeutic regimen will improve Outcome: Progressing   Problem: Activity: Goal: Interest or engagement in leisure activities will improve Outcome: Progressing

## 2023-05-08 NOTE — Progress Notes (Signed)
   05/08/23 2310  Psych Admission Type (Psych Patients Only)  Admission Status Involuntary  Psychosocial Assessment  Patient Complaints Crying spells;Depression;Anxiety  Eye Contact Fair  Facial Expression Sad  Affect Anxious;Sad  Speech Logical/coherent;Slow  Interaction Assertive;Childlike  Motor Activity Slow;Tremors  Appearance/Hygiene Designer, industrial/product Cooperative;Appropriate to situation  Mood Depressed;Anxious;Sad  Thought Process  Coherency WDL  Content WDL  Delusions None reported or observed  Perception WDL  Hallucination None reported or observed  Judgment Impaired  Confusion None  Danger to Self  Current suicidal ideation? Denies  Self-Injurious Behavior No self-injurious ideation or behavior indicators observed or expressed   Agreement Not to Harm Self Yes  Description of Agreement verbal  Danger to Others  Danger to Others None reported or observed

## 2023-05-08 NOTE — Progress Notes (Signed)
   05/08/23 0900  Psych Admission Type (Psych Patients Only)  Admission Status Involuntary  Psychosocial Assessment  Patient Complaints Depression;Anxiety  Eye Contact Fair  Facial Expression Flat  Affect Appropriate to circumstance  Speech Logical/coherent;Soft  Interaction Childlike  Motor Activity Slow;Tremors  Appearance/Hygiene Unremarkable  Behavior Characteristics Cooperative;Appropriate to situation  Mood Depressed;Anxious  Thought Process  Coherency WDL  Content WDL  Delusions None reported or observed  Perception WDL  Hallucination None reported or observed  Judgment Impaired  Confusion None  Danger to Self  Current suicidal ideation? Denies  Description of Suicide Plan No plan  Self-Injurious Behavior No self-injurious ideation or behavior indicators observed or expressed   Agreement Not to Harm Self Yes  Description of Agreement Pt verbally contracts for safety  Danger to Others  Danger to Others None reported or observed

## 2023-05-08 NOTE — Plan of Care (Signed)
  Problem: Coping: Goal: Will verbalize feelings Outcome: Progressing   Problem: Activity: Goal: Interest or engagement in activities will improve Outcome: Progressing   Problem: Safety: Goal: Periods of time without injury will increase Outcome: Progressing

## 2023-05-08 NOTE — Progress Notes (Signed)
[facility] MD Progress Note  05/08/2023 9:38 AM Yolanda [name]  MRN:  [id number]  Principal Problem: Schizoaffective disorder, depressive type (HCC)  Diagnosis: Principal Problem:   Schizoaffective disorder, depressive type (HCC) Active Problems:   GERD (gastroesophageal reflux disease)   Intellectual disability   Tobacco use disorder  Reason for admission: This is the first psychiatric admission in this [facility] in 12 years for this 40 AA female with an extensive hx of mental illnesses & probable polysubstance use disorders. She is admitted to the [facility] from the [facility] hospital with complain of worsening suicidal ideations with plan to stab herself. Per chart review, patient apparently reported at the ED that she has been depressed for a while & has not been taking her mental health medications. After medical evaluation.clearance, she was transferred to the [facility] for further psychiatric evaluation/treatments.   Patient assessment note: Staff reports that the patient has been compliant with medications and treatment.  Patient reports fair sleep and appetite, no irritability or agitation, denies depressed mood or feeling hopeless or helpless.  Patient has been stable on medications and denies Se to meds, she denies anxiety and reports getting out of the room and interacting in the milieu efficiently with no issues to be reported.  She denies SI/HI/AVH.  I discussed with patient need to clean her room given a lot of items including clothes, cups and food are scattered all over over the room. Still awaiting placement Total Time spent with patient:  25 minutes  Past Psychiatric History:  See H & P  Past Medical History:  Past Medical History:  Diagnosis Date   Bipolar affect, depressed (HCC)    Constipation 08/17/2022   Depression    Falls 07/21/2022   Fracture of femoral neck, right, closed (HCC) 01/17/2022   Herpes simplex 08/22/2017   Open fracture dislocation of right elbow joint 01/17/2022     Past Surgical History:  Procedure Laterality Date   NO PAST SURGERIES     SALPINGECTOMY     Family History: History reviewed. No pertinent family history.  Family Psychiatric  History: See H & P  Social History:  Social History   Substance and Sexual Activity  Alcohol Use Yes     Social History   Substance and Sexual Activity  Drug Use Yes   Types: Cocaine, Marijuana    Social History   Socioeconomic History   Marital status: Single    Spouse name: Not on file   Number of children: Not on file   Years of education: Not on file   Highest education level: Not on file  Occupational History   Not on file  Tobacco Use   Smoking status: Every Day   Smokeless tobacco: Not on file  Substance and Sexual Activity   Alcohol use: Yes   Drug use: Yes    Types: Cocaine, Marijuana   Sexual activity: Yes  Other Topics Concern   Not on file  Social History Narrative   Not on file   Social Determinants of Health   Financial Resource Strain: Low Risk  (09/12/2022)   Received from [facility], [facility]   Overall Financial Resource Strain (CARDIA)    Difficulty of Paying Living Expenses: Not hard at all  Food Insecurity: Patient Declined (12/20/2022)   Hunger Vital Sign    Worried About Running Out of Food in the Last Year: Patient declined    Ran Out of Food in the Last Year: Patient declined  Transportation Needs: No Transportation  Needs (12/20/2022)   PRAPARE - Administrator, Civil Service (Medical): No    Lack of Transportation (Non-Medical): No  Physical Activity: Not on file  Stress: No Stress Concern Present (07/17/2022)   Received from [facility], [facility] of Occupational Health - Occupational Stress Questionnaire    Feeling of Stress : Not at all  Social Connections: Unknown (07/16/2022)   Received from [facility], [facility]   Social Network    Social Network: Not on file   Sleep: Good  Appetite:   Good  Current Medications: Current Facility-Administered Medications  Medication Dose Route Frequency Provider Last Rate Last Admin   acetaminophen (TYLENOL) tablet 650 mg  650 mg Oral Q6H PRN [name], NP   650 mg at 05/07/23 1601   alum & mag hydroxide-simeth (MAALOX/MYLANTA) 200-200-20 MG/5ML suspension 30 mL  30 mL Oral Q4H PRN [name], NP   30 mL at 01/28/23 1530   busPIRone (BUSPAR) tablet 15 mg  15 mg Oral BID [name], NP   15 mg at 05/08/23 7829   cyanocobalamin (VITAMIN B12) injection 1,000 mcg  1,000 mcg Intramuscular Q30 days Sarita Bottom, MD   1,000 mcg at 05/03/23 1257   diphenhydrAMINE (BENADRYL) capsule 50 mg  50 mg Oral TID PRN [name], NP   50 mg at 01/15/23 1211   Or   diphenhydrAMINE (BENADRYL) injection 50 mg  50 mg Intramuscular TID PRN [name], NP       docusate sodium (COLACE) capsule 100 mg  100 mg Oral Daily [name], NP   100 mg at 05/08/23 5621   feeding supplement (ENSURE ENLIVE / ENSURE PLUS) liquid 237 mL  237 mL Oral TID BM Nkwenti, Doris, NP   237 mL at 05/07/23 1458   haloperidol (HALDOL) tablet 5 mg  5 mg Oral TID PRN [name], NP   5 mg at 05/04/23 1837   Or   haloperidol lactate (HALDOL) injection 5 mg  5 mg Intramuscular TID PRN [name], NP       hydrocortisone cream 1 %   Topical BID [name], MD   1 Application at 05/07/23 1601   hydrOXYzine (ATARAX) tablet 25 mg  25 mg Oral TID PRN [name], DO   25 mg at 05/01/23 2053   LORazepam (ATIVAN) tablet 2 mg  2 mg Oral TID PRN [name], NP   2 mg at 01/15/23 1211   Or   LORazepam (ATIVAN) injection 2 mg  2 mg Intramuscular TID PRN [name], NP       magnesium hydroxide (MILK OF MAGNESIA) suspension 30 mL  30 mL Oral Daily PRN [name], NP   30 mL at 04/13/23 0814   melatonin tablet 5 mg  5 mg Oral QHS Nkwenti, Doris, NP   5 mg at 05/07/23 2112   nicotine (NICODERM CQ - dosed in mg/24 hours) patch 21 mg  21 mg Transdermal Daily  [name], DO   21 mg at 03/25/23 3086   nicotine polacrilex (NICORETTE) gum 2 mg  2 mg Oral PRN [name], MD   2 mg at 05/07/23 1252   paliperidone (INVEGA) 24 hr tablet 6 mg  6 mg Oral Daily [name], NP   6 mg at 05/07/23 2112   pantoprazole (PROTONIX) EC tablet 40 mg  40 mg Oral Daily [name], NP   40 mg at 05/08/23 0752   polyethylene glycol (MIRALAX /  GLYCOLAX) packet 17 g  17 g Oral Daily PRN [name], NP   17 g at 04/13/23 1734   sertraline (ZOLOFT) tablet 150 mg  150 mg Oral Daily Massengill, Nathan, MD   150 mg at 05/08/23 1914   SUMAtriptan (IMITREX) tablet 25 mg  25 mg Oral BID PRN [name], NP   25 mg at 05/07/23 2112   traZODone (DESYREL) tablet 50 mg  50 mg Oral QHS [name], NP   50 mg at 05/07/23 2112   Vitamin D (Ergocalciferol) (DRISDOL) 1.25 MG (50000 UNIT) capsule 50,000 Units  50,000 Units Oral Q7 days [name], NP   50,000 Units at 04/27/23 1612   Lab Results:  No results found for this or any previous visit (from the past 48 hour(s)).      Blood Alcohol level:  Lab Results  Component Value Date   ETH <10 12/19/2022   ETH <10 11/21/2019   Metabolic Disorder Labs: Lab Results  Component Value Date   HGBA1C 4.4 (L) 12/21/2022   MPG 79.58 12/21/2022   No results found for: "PROLACTIN" Lab Results  Component Value Date   CHOL 218 (H) 12/21/2022   TRIG 81 12/21/2022   HDL 53 12/21/2022   CHOLHDL 4.1 12/21/2022   VLDL 16 12/21/2022   LDLCALC 149 (H) 12/21/2022   LDLCALC 110 (H) 03/13/2011   Physical Findings: AIMS: Facial and Oral Movements Muscles of Facial Expression: None Lips and Perioral Area: None Jaw: None Tongue: None,Extremity Movements Upper (arms, wrists, hands, fingers): None Lower (legs, knees, ankles, toes): None, Trunk Movements Neck, shoulders, hips: None, Global Judgements Severity of abnormal movements overall : None Incapacitation due to abnormal movements: None Patient's awareness  of abnormal movements: No Awareness, Dental Status Current problems with teeth and/or dentures?: No Does patient usually wear dentures?: No  CIWA:    COWS:    AIMS:0 Musculoskeletal: Strength & Muscle Tone: within normal limits Gait & Station: normal Patient leans: N/A  Psychiatric Specialty Exam:  Presentation  General Appearance:  Casual; Disheveled  Eye Contact: Limited to baseline Speech: Decreased amount, decreased tone and volume but at baseline Speech Volume: Decreased  Handedness: Right  Mood and Affect  Mood: Sad and depressed mainly secondary to being in the hospital with no place to go Affect: Blunt; Restricted  Thought Process  Thought Processes: Linear  Descriptions of Associations:Intact  Orientation:Full (Time, Place and Person)  Thought Content:Rumination Concrete History of Schizophrenia/Schizoaffective disorder:Yes  Duration of Psychotic Symptoms:Greater than six months  Hallucinations:No data recorded     Ideas of Reference: None  Suicidal Thoughts:No data recorded     Homicidal Thoughts:No data recorded    Sensorium  Memory: Immediate Fair; Recent Poor; Remote Fair  Judgment: Fair  Insight: Fair  Chartered certified accountant: Fair  Attention Span: Fair  Recall: [employer] of Knowledge: Fair  Language: Fair  Psychomotor Activity  Psychomotor Activity: No data recorded     Assets  Assets: Communication Skills; Desire for Improvement  Sleep  Sleep: No data recorded     Physical Exam: Physical Exam Vitals and nursing note reviewed.  Cardiovascular:     Rate and Rhythm: Normal rate.  Pulmonary:     Effort: Pulmonary effort is normal.  Genitourinary:    Comments: Deferred Musculoskeletal:        General: Normal range of motion.     Cervical back: Normal range of motion.  Skin:    General: Skin is warm and dry.  Neurological:  General: No focal deficit present.     Mental  Status: She is alert and oriented to person, place, and time.  Psychiatric:        Mood and Affect: Mood normal.        Behavior: Behavior normal.        Thought Content: Thought content normal.    Review of Systems  Constitutional:  Negative for fever.  Respiratory:  Negative for cough, shortness of breath and wheezing.   Gastrointestinal:  Negative for abdominal pain, constipation and vomiting.  Genitourinary:        Pain to light touch on the R side  Neurological:  Negative for dizziness.  Psychiatric/Behavioral:  Negative for depression, hallucinations, memory loss, substance abuse and suicidal ideas. The patient is not nervous/anxious and does not have insomnia.   All other systems reviewed and are negative.  Blood pressure 119/75, pulse 79, temperature 97.6 F (36.4 C), temperature source Oral, resp. rate 16, height 4\' 11"  (1.499 m), weight 63 kg, SpO2 99%. Body mass index is 28.05 kg/m.  Treatment Plan Summary: Daily contact with patient to assess and evaluate symptoms and progress in treatment and Medication management.   Will continue to monitor side pain, patient is reporting patient was sensitive to light touch on the area. At this time it may be 2/2 to patient constantly lying on that side. Patient not having urinary concerns currently.    Still waiting on safe disposition as patient would not be able to live independently given cognitive impairment. Recommend assisted living.  She has a meeting today on 05/02/2023 regarding her placement.   No medication side effects reported.  Because of urinary incontinence, recommended adult diapers to be worn.   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  Plan:  -Continue Imitrex 25 mg PRN BID for  migraines -Continue Colace 100 mg daily for constipation -Continue Vitamin D 50.000 units weekly for bone health. -Continue Sertraline150 mg po Q daily for depression/anxiety -Continue Buspar 15 mg po bid for anxiety.  -Continue paliperidone 6 mg po qd for mood stabilization -Continue Trazodone 50 mg nightly for insomnia  -Continue Nicoderm 21 mg topically Q 24 hrs for nicotine withdrawal management. -Continue vitamin B12 1000 mcg IM weekly for 4 weeks then monthly -Continue Melatonin 5 mg nightly for sleep -Continue Protonix EC 40 mg p.o. daily for GERD -Continue MiraLAX 17 g p.o. PRN for constipation -Continue hydroxyzine 25 mg p.o. 3 times daily as needed for anxiety -Continue Ensure nutritional shakes TID in between meals  -Previously discontinued Hydroxyzine 50 mg nightly-hypotension in the mornings    Obtain CBC and CMP to monitor lab work given last lab work was obtained about a month ago.  Safety and Monitoring: Voluntary admission to inpatient psychiatric unit for safety, stabilization and treatment Daily contact with patient to assess and evaluate symptoms and progress in treatment Patient's case to be discussed in multi-disciplinary team meeting Observation Level : q15 minute checks Vital signs: q12 hours Precautions: Safety   Discharge Planning: Social work and case management to assist with discharge planning and identification of hospital follow-up needs prior to discharge Estimated LOS: Unknown at this time. Discharge Concerns: Need to establish a safety plan; Medication compliance and effectiveness Discharge Goals: Return home with outpatient referrals for mental health follow-up including medication management/psychotherapy No legal guardian, has payee Total Time Spent in  Direct Patient Care:   I personally spent 25 minutes on the unit in direct patient care. The direct patient care time included face-to-face time with the patient, reviewing the patient's chart,  communicating with other professionals, and coordinating care. Greater than 50% of this time was spent in counseling or coordinating care with the patient regarding goals of hospitalization, psycho-education, and discharge planning needs.   Demarrion Meiklejohn [name], MD,  05/08/2023, 9:38 AM

## 2023-05-08 NOTE — Progress Notes (Signed)
   05/08/23 0600  15 Minute Checks  Location Bedroom  Visual Appearance Calm  Behavior Sleeping  Sleep (Behavioral Health Patients Only)  Calculate sleep? (Click Yes once per 24 hr at 0600 safety check) Yes  Documented sleep last 24 hours 9.25

## 2023-05-08 NOTE — [facility] Group Notes (Signed)
Spiritual care group facilitated by Chaplain Katy Denessa Cavan, B[facility]  Group focused on topic of strength. Group members reflected on what thoughts and feelings emerge when they hear this topic. They then engaged in facilitated dialog around how strength is present in their lives. This dialog focused on representing what strength had been to them in their lives (images and patterns given) and what they saw as helpful in their life now (what they needed / wanted).  Activity drew on narrative framework.  Patient Progress: Did not attend.  

## 2023-05-08 NOTE — Progress Notes (Signed)
   05/07/23 2112  Psych Admission Type (Psych Patients Only)  Admission Status Involuntary  Psychosocial Assessment  Patient Complaints Worrying  Eye Contact Fair  Facial Expression Flat  Affect Appropriate to circumstance  Speech Logical/coherent;Soft  Interaction Assertive;Childlike  Motor Activity Slow;Fidgety  Appearance/Hygiene Unremarkable  Behavior Characteristics Cooperative  Mood Preoccupied  Thought Process  Coherency WDL  Content Blaming self  Delusions None reported or observed  Perception WDL  Hallucination None reported or observed  Judgment Impaired  Confusion None  Danger to Self  Current suicidal ideation? Denies  Agreement Not to Harm Self Yes  Description of Agreement verbal  Danger to Others  Danger to Others None reported or observed

## 2023-05-08 NOTE — Progress Notes (Signed)
Yolanda [name] requested to speak with chaplain. As she often does, she became tearful and stated that she "should have read the Bible more."  Chaplain provided listening as she shared about her regrets.  Chaplain encouraged her to give herself some grace.  She then asked chaplain what to do when her spirit tells her to hurt herself.  Chaplain asked if she was having thoughts of hurting herself now, she said "I must be if I said it out loud."  Chaplain encouraged her to talk with her provider about how to handle those thoughts and provided education about intrusive thoughts being different from our "spirits."  Chaplain also provided listening and prayer.  Chaplain informed medical team of intrusive thoughts.  [address] [place] Pager, [phone]

## 2023-05-08 NOTE — Group Note (Signed)
Recreation Therapy Group Note   Group Topic:Other  Group Date: 05/08/2023 Start Time: 0920 End Time: 1012 Facilitators: Onie Hayashi-McCall, LRT,CTRS Location: [address] Dayroom   Group Topic: Self-Expression  Goal Area(s) Addresses:  Patient will successfully identify positive attributes about themselves.  Patient will identify healthy ways to express feelings. Patient will acknowledge benefit(s) of improved self-expression.   Activity: Quarry manager. Patients were given card stock paper and individual watercolor sets. Patients were to use the supplies given to create pictures express what they are feeling or thinking.  Education: Self-Expression Discharge Planning  Education Outcome: Acknowledges education/In group clarification offered/Needs additional education   Affect/Mood: N/A   Participation Level: Did not attend    Clinical Observations/Individualized Feedback:     Plan: Continue to engage patient in RT group sessions 2-3x/week.   Shir Bergman-McCall, LRT,CTRS 05/08/2023 1:11 PM

## 2023-05-09 LAB — CBC WITH DIFFERENTIAL/PLATELET
Abs Immature Granulocytes: 0.01 10*3/uL (ref 0.00–0.07)
Basophils Absolute: 0 10*3/uL (ref 0.0–0.1)
Basophils Relative: 1 %
Eosinophils Absolute: 0.3 10*3/uL (ref 0.0–0.5)
Eosinophils Relative: 6 %
HCT: 39.1 % (ref 36.0–46.0)
Hemoglobin: 12.2 g/dL (ref 12.0–15.0)
Immature Granulocytes: 0 %
Lymphocytes Relative: 41 %
Lymphs Abs: 2.2 10*3/uL (ref 0.7–4.0)
MCH: 29.2 pg (ref 26.0–34.0)
MCHC: 31.2 g/dL (ref 30.0–36.0)
MCV: 93.5 fL (ref 80.0–100.0)
Monocytes Absolute: 0.5 10*3/uL (ref 0.1–1.0)
Monocytes Relative: 9 %
Neutro Abs: 2.4 10*3/uL (ref 1.7–7.7)
Neutrophils Relative %: 43 %
Platelets: 263 10*3/uL (ref 150–400)
RBC: 4.18 MIL/uL (ref 3.87–5.11)
RDW: 13.1 % (ref 11.5–15.5)
WBC: 5.3 10*3/uL (ref 4.0–10.5)
nRBC: 0 % (ref 0.0–0.2)

## 2023-05-09 LAB — COMPREHENSIVE METABOLIC PANEL
ALT: 19 U/L (ref 0–44)
AST: 19 U/L (ref 15–41)
Albumin: 3.7 g/dL (ref 3.5–5.0)
Alkaline Phosphatase: 87 U/L (ref 38–126)
Anion gap: 8 (ref 5–15)
BUN: 15 mg/dL (ref 6–20)
CO2: 28 mmol/L (ref 22–32)
Calcium: 9.2 mg/dL (ref 8.9–10.3)
Chloride: 102 mmol/L (ref 98–111)
Creatinine, Ser: 0.67 mg/dL (ref 0.44–1.00)
GFR, Estimated: >60 mL/min (ref >60)
Glucose, Bld: 95 mg/dL (ref 70–99)
Potassium: 4.2 mmol/L (ref 3.5–5.1)
Sodium: 138 mmol/L (ref 135–145)
Total Bilirubin: 0.6 mg/dL (ref 0.3–1.2)
Total Protein: 7.7 g/dL (ref 6.5–8.1)

## 2023-05-09 NOTE — Plan of Care (Signed)
  Problem: Education: Goal: Utilization of techniques to improve thought processes will improve Outcome: Progressing Goal: Knowledge of the prescribed therapeutic regimen will improve Outcome: Progressing   Problem: Activity: Goal: Interest or engagement in leisure activities will improve Outcome: Progressing Goal: Imbalance in normal sleep/wake cycle will improve Outcome: Progressing   

## 2023-05-09 NOTE — Plan of Care (Signed)
  Problem: Coping: Goal: Coping ability will improve Outcome: Progressing Goal: Will verbalize feelings Outcome: Progressing   

## 2023-05-09 NOTE — Plan of Care (Signed)
Problem: Coping: Goal: Coping ability will improve Outcome: Progressing Goal: Will verbalize feelings Outcome: Progressing   Problem: Safety: Goal: Ability to disclose and discuss suicidal ideas will improve Outcome: Progressing Goal: Ability to identify and utilize support systems that promote safety will improve Outcome: Progressing   D- Patient alert and oriented. Flat affect. Denies anxiety and depression. Denies SI, HI, AVH, and pain.  A- Scheduled medications administered to patient, per MD orders. Support and encouragement provided.  Routine safety checks conducted every 15 minutes.  Patient informed to notify staff with problems or concerns. R- No adverse drug reactions noted. Patient contracts for safety at this time. Patient compliant with medications and treatment plan. Patient receptive, calm, and cooperative. Isolates in room except for meds/meals.  Patient remains safe at this time.

## 2023-05-09 NOTE — Progress Notes (Signed)
[facility] MD Progress Note  05/09/2023 10:24 AM Yolanda [name]  MRN:  [id number]  Principal Problem: Schizoaffective disorder, depressive type (HCC)  Diagnosis: Principal Problem:   Schizoaffective disorder, depressive type (HCC) Active Problems:   GERD (gastroesophageal reflux disease)   Intellectual disability   Tobacco use disorder  Reason for admission: This is the first psychiatric admission in this [facility] in 12 years for this 72 AA female with an extensive hx of mental illnesses & probable polysubstance use disorders. She is admitted to the [facility] from the [facility] hospital with complain of worsening suicidal ideations with plan to stab herself. Per chart review, patient apparently reported at the ED that she has been depressed for a while & has not been taking her mental health medications. After medical evaluation.clearance, she was transferred to the [facility] for further psychiatric evaluation/treatments.   Patient assessment note: Staff reports that the patient has been compliant with medications and treatment.  Patient cleaned her room yesterday and did fold her close and clear the item off the floor, she was praised by staff and this provider for her efforts and encouraged her to continue maintaining her room clean.  Patient reports fair sleep and appetite, no irritability or agitation, denies depressed mood or feeling hopeless or helpless.  Patient has been stable on medications and denies Se to meds, she denies anxiety and reports getting out of the room and interacting in the milieu efficiently with no issues to be reported, she was encouraged to continue spending most time out of her room attending groups or interacting with other peers, she agreed.  When asking her she continues to deny passive SI wishing self dead or feeling hopeless or helpless she denies active SI intention or plan, denies HI or AVH.   Still awaiting placement  Total Time spent with patient:  25 minutes  Past  Psychiatric History:  See H & P  Past Medical History:  Past Medical History:  Diagnosis Date   Bipolar affect, depressed (HCC)    Constipation 08/17/2022   Depression    Falls 07/21/2022   Fracture of femoral neck, right, closed (HCC) 01/17/2022   Herpes simplex 08/22/2017   Open fracture dislocation of right elbow joint 01/17/2022    Past Surgical History:  Procedure Laterality Date   NO PAST SURGERIES     SALPINGECTOMY     Family History: History reviewed. No pertinent family history.  Family Psychiatric  History: See H & P  Social History:  Social History   Substance and Sexual Activity  Alcohol Use Yes     Social History   Substance and Sexual Activity  Drug Use Yes   Types: Cocaine, Marijuana    Social History   Socioeconomic History   Marital status: Single    Spouse name: Not on file   Number of children: Not on file   Years of education: Not on file   Highest education level: Not on file  Occupational History   Not on file  Tobacco Use   Smoking status: Every Day   Smokeless tobacco: Not on file  Substance and Sexual Activity   Alcohol use: Yes   Drug use: Yes    Types: Cocaine, Marijuana   Sexual activity: Yes  Other Topics Concern   Not on file  Social History Narrative   Not on file   Social Determinants of Health   Financial Resource Strain: Low Risk  (09/12/2022)   Received from [facility], [facility]   Overall  Financial Resource Strain (CARDIA)    Difficulty of Paying Living Expenses: Not hard at all  Food Insecurity: Patient Declined (12/20/2022)   Hunger Vital Sign    Worried About Running Out of Food in the Last Year: Patient declined    Ran Out of Food in the Last Year: Patient declined  Transportation Needs: No Transportation Needs (12/20/2022)   PRAPARE - Administrator, Civil Service (Medical): No    Lack of Transportation (Non-Medical): No  Physical Activity: Not on file  Stress: No Stress Concern Present  (07/17/2022)   Received from [facility], [facility] Occupational [facility] - Occupational Stress Questionnaire    Feeling of Stress : Not at all  Social Connections: Unknown (07/16/2022)   Received from [facility], [facility]   Social Network    Social Network: Not on file   Sleep: Good  Appetite:  Good  Current Medications: Current Facility-Administered Medications  Medication Dose Route Frequency Provider Last Rate Last Admin   acetaminophen (TYLENOL) tablet 650 mg  650 mg Oral Q6H PRN [name], NP   650 mg at 05/08/23 1354   alum & mag hydroxide-simeth (MAALOX/MYLANTA) 200-200-20 MG/5ML suspension 30 mL  30 mL Oral Q4H PRN [name], NP   30 mL at 01/28/23 1530   busPIRone (BUSPAR) tablet 15 mg  15 mg Oral BID [name], NP   15 mg at 05/09/23 0754   cyanocobalamin (VITAMIN B12) injection 1,000 mcg  1,000 mcg Intramuscular Q30 days Sarita Bottom, MD   1,000 mcg at 05/03/23 1257   diphenhydrAMINE (BENADRYL) capsule 50 mg  50 mg Oral TID PRN [name], NP   50 mg at 01/15/23 1211   Or   diphenhydrAMINE (BENADRYL) injection 50 mg  50 mg Intramuscular TID PRN [name], NP       docusate sodium (COLACE) capsule 100 mg  100 mg Oral Daily [name], NP   100 mg at 05/09/23 0754   feeding supplement (ENSURE ENLIVE / ENSURE PLUS) liquid 237 mL  237 mL Oral TID BM [name], NP   237 mL at 05/08/23 2024   haloperidol (HALDOL) tablet 5 mg  5 mg Oral TID PRN [name], NP   5 mg at 05/04/23 1837   Or   haloperidol lactate (HALDOL) injection 5 mg  5 mg Intramuscular TID PRN [name], NP       hydrOXYzine (ATARAX) tablet 25 mg  25 mg Oral TID PRN [name], DO   25 mg at 05/08/23 2024   LORazepam (ATIVAN) tablet 2 mg  2 mg Oral TID PRN [name], NP   2 mg at 01/15/23 1211   Or   LORazepam (ATIVAN) injection 2 mg  2 mg Intramuscular TID PRN [name], NP       magnesium hydroxide (MILK OF MAGNESIA) suspension 30  mL  30 mL Oral Daily PRN [name], NP   30 mL at 04/13/23 0814   melatonin tablet 5 mg  5 mg Oral QHS Nkwenti, Doris, NP   5 mg at 05/08/23 2024   nicotine (NICODERM CQ - dosed in mg/24 hours) patch 21 mg  21 mg Transdermal Daily [name], DO   21 mg at 03/25/23 9604   nicotine polacrilex (NICORETTE) gum 2 mg  2 mg Oral PRN [name], MD   2 mg at 05/08/23 1506   paliperidone (INVEGA) 24 hr tablet 6 mg  6 mg Oral  Daily [name], NP   6 mg at 05/08/23 2025   pantoprazole (PROTONIX) EC tablet 40 mg  40 mg Oral Daily Nwoko, Nicole Kindred I, NP   40 mg at 05/09/23 0754   polyethylene glycol (MIRALAX / GLYCOLAX) packet 17 g  17 g Oral Daily PRN [name], NP   17 g at 04/13/23 1734   sertraline (ZOLOFT) tablet 150 mg  150 mg Oral Daily Massengill, Harrold Donath, MD   150 mg at 05/09/23 0754   SUMAtriptan (IMITREX) tablet 25 mg  25 mg Oral BID PRN [name], NP   25 mg at 05/07/23 2112   traZODone (DESYREL) tablet 50 mg  50 mg Oral QHS Nkwenti, Tyler Aas, NP   50 mg at 05/08/23 2025   Vitamin D (Ergocalciferol) (DRISDOL) 1.25 MG (50000 UNIT) capsule 50,000 Units  50,000 Units Oral Q7 days [name], NP   50,000 Units at 04/27/23 1612   Lab Results:  No results found for this or any previous visit (from the past 48 hour(s)).      Blood Alcohol level:  Lab Results  Component Value Date   ETH <10 12/19/2022   ETH <10 11/21/2019   Metabolic Disorder Labs: Lab Results  Component Value Date   HGBA1C 4.4 (L) 12/21/2022   MPG 79.58 12/21/2022   No results found for: "PROLACTIN" Lab Results  Component Value Date   CHOL 218 (H) 12/21/2022   TRIG 81 12/21/2022   HDL 53 12/21/2022   CHOLHDL 4.1 12/21/2022   VLDL 16 12/21/2022   LDLCALC 149 (H) 12/21/2022   LDLCALC 110 (H) 03/13/2011   Physical Findings: AIMS: Facial and Oral Movements Muscles of Facial Expression: None Lips and Perioral Area: None Jaw: None Tongue: None,Extremity Movements Upper (arms, wrists, hands,  fingers): None Lower (legs, knees, ankles, toes): None, Trunk Movements Neck, shoulders, hips: None, Global Judgements Severity of abnormal movements overall : None Incapacitation due to abnormal movements: None Patient's awareness of abnormal movements: No Awareness, Dental Status Current problems with teeth and/or dentures?: No Does patient usually wear dentures?: No  CIWA:    COWS:    AIMS:0 Musculoskeletal: Strength & Muscle Tone: within normal limits Gait & Station: normal Patient leans: N/A  Psychiatric Specialty Exam:  Presentation  General Appearance:  Casual; Disheveled  Eye Contact: Limited to baseline Speech: Decreased amount, decreased tone and volume but at baseline Speech Volume: Decreased  Handedness: Right  Mood and Affect  Mood: Euthymic, pleasant, calm Affect: Blunt; Restricted At baseline Thought Process  Thought Processes: Linear  Descriptions of Associations:Intact  Orientation:Full (Time, Place and Person)  Thought Content:Rumination Concrete History of Schizophrenia/Schizoaffective disorder:Yes  Duration of Psychotic Symptoms:Greater than six months  Hallucinations:No data recorded     Ideas of Reference: None  Suicidal Thoughts:No data recorded     Homicidal Thoughts:No data recorded    Sensorium  Memory: Immediate Fair; Recent Poor; Remote Fair  Judgment: Fair  Insight: Fair  Chartered certified accountant: Fair  Attention Span: Fair  Recall: [employer] of Knowledge: Fair  Language: Fair  Psychomotor Activity  Psychomotor Activity: No data recorded     Assets  Assets: Communication Skills; Desire for Improvement  Sleep  Sleep: No data recorded     Physical Exam: Physical Exam Vitals and nursing note reviewed.  Cardiovascular:     Rate and Rhythm: Normal rate.  Pulmonary:     Effort: Pulmonary effort is normal.  Genitourinary:    Comments: Deferred Musculoskeletal:  General: Normal range of motion.     Cervical back: Normal range of motion.  Skin:    General: Skin is warm and dry.  Neurological:     General: No focal deficit present.     Mental Status: She is alert and oriented to person, place, and time.  Psychiatric:        Mood and Affect: Mood normal.        Behavior: Behavior normal.        Thought Content: Thought content normal.    Review of Systems  Constitutional:  Negative for fever.  Respiratory:  Negative for cough, shortness of breath and wheezing.   Gastrointestinal:  Negative for abdominal pain, constipation and vomiting.  Genitourinary:        Pain to light touch on the R side  Skin:  Negative for rash.  Neurological:  Negative for dizziness.  Psychiatric/Behavioral:  Negative for depression, hallucinations, memory loss, substance abuse and suicidal ideas. The patient is not nervous/anxious and does not have insomnia.   All other systems reviewed and are negative.  Blood pressure (!) 124/55, pulse 90, temperature 98.2 F (36.8 C), temperature source Oral, resp. rate 12, height 4\' 11"  (1.499 m), weight 63 kg, SpO2 98%. Body mass index is 28.05 kg/m.  Treatment Plan Summary: Daily contact with patient to assess and evaluate symptoms and progress in treatment and Medication management.   Will continue to monitor side pain, patient is reporting patient was sensitive to light touch on the area. At this time it may be 2/2 to patient constantly lying on that side. Patient not having urinary concerns currently.    Still waiting on safe disposition as patient would not be able to live independently given cognitive impairment. Recommend assisted living.     No medication side effects reported.  Because of urinary incontinence, recommended adult diapers to be worn.   Principal/active diagnoses:  Schizoaffective disorder, depressive type (HCC)  Active Problems: GERD (gastroesophageal reflux disease) Intellectual  disability Tobacco use disorder (MOCA 16/30) moderate cognitive impairment probably related to moderate intellectual disability.  Plan:  -Continue Imitrex 25 mg PRN BID for migraines -Continue Colace 100 mg daily for constipation -Continue Vitamin D 50.000 units weekly for bone health. -Continue Sertraline150 mg po Q daily for depression/anxiety -Continue Buspar 15 mg po bid for anxiety.  -Continue paliperidone 6 mg po qd for mood stabilization -Continue Trazodone 50 mg nightly for insomnia  -Continue Nicoderm 21 mg topically Q 24 hrs for nicotine withdrawal management. -Continue vitamin B12 1000 mcg IM weekly for 4 weeks then monthly -Continue Melatonin 5 mg nightly for sleep -Continue Protonix EC 40 mg p.o. daily for GERD -Continue MiraLAX 17 g p.o. PRN for constipation -Continue hydroxyzine 25 mg p.o. 3 times daily as needed for anxiety -Continue Ensure nutritional shakes TID in between meals  -Previously discontinued Hydroxyzine 50 mg nightly-hypotension in the mornings    Obtain CBC and CMP to monitor lab work given last lab work was obtained about a month ago.  Safety and Monitoring: Voluntary admission to inpatient psychiatric unit for safety, stabilization and treatment Daily contact with patient to assess and evaluate symptoms and progress in treatment Patient's case to be discussed in multi-disciplinary team meeting Observation Level : q15 minute checks Vital signs: q12 hours Precautions: Safety   Discharge Planning: Social work and case management to assist with discharge planning and identification of hospital follow-up needs prior to discharge Estimated LOS: Unknown at this time. Discharge Concerns: Need to establish a  safety plan; Medication compliance and effectiveness Discharge Goals: Return home with outpatient referrals for mental health follow-up including medication management/psychotherapy No legal guardian, has payee Total Time Spent in Direct Patient Care:    I personally spent 25 minutes on the unit in direct patient care. The direct patient care time included face-to-face time with the patient, reviewing the patient's chart, communicating with other professionals, and coordinating care. Greater than 50% of this time was spent in counseling or coordinating care with the patient regarding goals of hospitalization, psycho-education, and discharge planning needs.   Joshwa Hemric [name], MD,  05/09/2023, 10:24 AM

## 2023-05-09 NOTE — Group Note (Signed)
LCSW Group Therapy Note   Group Date: 05/09/2023 Start Time: 1100 End Time: 1200   Type of Therapy and Topic:  Group Therapy: Boundaries  Participation Level:  Did Not Attend  Description of Group: This group will address the use of boundaries in their personal lives. Patients will explore why boundaries are important, the difference between healthy and unhealthy boundaries, and negative and postive outcomes of different boundaries and will look at how boundaries can be crossed.  Patients will be encouraged to identify current boundaries in their own lives and identify what kind of boundary is being set. Facilitators will guide patients in utilizing problem-solving interventions to address and correct types boundaries being used and to address when no boundary is being used. Understanding and applying boundaries will be explored and addressed for obtaining and maintaining a balanced life. Patients will be encouraged to explore ways to assertively make their boundaries and needs known to significant others in their lives, using other group members and facilitator for role play, support, and feedback.  Therapeutic Goals:  1.  Patient will identify areas in their life where setting clear boundaries could be  used to improve their life.  2.  Patient will identify signs/triggers that a boundary is not being respected. 3.  Patient will identify two ways to set boundaries in order to achieve balance in  their lives: 4.  Patient will demonstrate ability to communicate their needs and set boundaries  through discussion and/or role plays  Summary of Patient Progress:  Pt did not attend group.  Therapeutic Modalities:   Cognitive Behavioral Therapy Solution-Focused Therapy  Kathi Der, LCSWA 05/09/2023  12:31 PM

## 2023-05-09 NOTE — Progress Notes (Signed)
Called [facility] to inquire about why patient's ordered labs were not obtained this morning. Lab reported that it was an error on their side and sent a phlebotomist to [facility] at 10:30 to obtain labs from patient. MD made aware.

## 2023-05-09 NOTE — Progress Notes (Signed)
   05/09/23 1000  Psych Admission Type (Psych Patients Only)  Admission Status Involuntary  Psychosocial Assessment  Patient Complaints Crying spells;Sadness  Eye Contact Fair  Facial Expression Flat;Sad  Affect Anxious;Sad  Speech Logical/coherent;Slow  Interaction Childlike  Motor Activity Slow;Tremors  Appearance/Hygiene Disheveled  Behavior Characteristics Cooperative  Mood Depressed;Anxious;Sad  Thought Process  Coherency WDL  Content WDL  Delusions None reported or observed  Perception WDL  Hallucination None reported or observed  Judgment Impaired  Confusion None  Danger to Self  Current suicidal ideation? Denies  Description of Suicide Plan No plan  Self-Injurious Behavior No self-injurious ideation or behavior indicators observed or expressed   Agreement Not to Harm Self Yes  Description of Agreement Verbal  Danger to Others  Danger to Others None reported or observed

## 2023-05-10 DIAGNOSIS — F251 Schizoaffective disorder, depressive type: Secondary | ICD-10-CM | POA: Diagnosis not present

## 2023-05-10 NOTE — Progress Notes (Signed)
[name] MD Progress Note  05/10/2023 11:40 AM [name]  MRN:  [id number]  Principal Problem: Schizoaffective disorder, depressive type (HCC)  Diagnosis: Principal Problem:   Schizoaffective disorder, depressive type (HCC) Active Problems:   GERD (gastroesophageal reflux disease)   Intellectual disability   Tobacco use disorder  Reason for admission: This is the first psychiatric admission in this [facility] in 12 years for this 27 AA female with an extensive hx of mental illnesses & probable polysubstance use disorders. She is admitted to the [facility] from the [facility] hospital with complain of worsening suicidal ideations with plan to stab herself. Per chart review, patient apparently reported at the ED that she has been depressed for a while & has not been taking her mental health medications. After medical evaluation.clearance, she was transferred to the [facility] for further psychiatric evaluation/treatments.   Patient assessment note: Patient continues to comply with medications on the unit, denies side effects with them.  Upon evaluation today she continues to report stable mood and continues to deny passive or active SI intention or plan and reports last time felt suicidal "long time ago" she denies auditory or visual hallucinations when asked and does not appear responding to stimuli no paranoia or other delusions noted.  She reports fair sleep and appetite.  She asked if her toddler grandson can visit and I discussed with her that I will put one-time order for her grandson to visit in conference room in presence of the staff at all-time, she agrees.  Still awaiting placement  Total Time spent with patient:  25 minutes  Past Psychiatric History:  See H & P  Past Medical History:  Past Medical History:  Diagnosis Date   Bipolar affect, depressed (HCC)    Constipation 08/17/2022   Depression    Falls 07/21/2022   Fracture of femoral neck, right, closed (HCC) 01/17/2022   Herpes simplex  08/22/2017   Open fracture dislocation of right elbow joint 01/17/2022    Past Surgical History:  Procedure Laterality Date   NO PAST SURGERIES     SALPINGECTOMY     Family History: History reviewed. No pertinent family history.  Family Psychiatric  History: See H & P  Social History:  Social History   Substance and Sexual Activity  Alcohol Use Yes     Social History   Substance and Sexual Activity  Drug Use Yes   Types: Cocaine, Marijuana    Social History   Socioeconomic History   Marital status: Single    Spouse name: Not on file   Number of children: Not on file   Years of education: Not on file   Highest education level: Not on file  Occupational History   Not on file  Tobacco Use   Smoking status: Every Day   Smokeless tobacco: Not on file  Substance and Sexual Activity   Alcohol use: Yes   Drug use: Yes    Types: Cocaine, Marijuana   Sexual activity: Yes  Other Topics Concern   Not on file  Social History Narrative   Not on file   Social Determinants of Health   Financial Resource Strain: Low Risk  (09/12/2022)   Received from [facility], [facility]   Overall Financial Resource Strain (CARDIA)    Difficulty of Paying Living Expenses: Not hard at all  Food Insecurity: Patient Declined (12/20/2022)   Hunger Vital Sign    Worried About Running Out of Food in the Last Year: Patient declined  Ran Out of Food in the Last Year: Patient declined  Transportation Needs: No Transportation Needs (12/20/2022)   PRAPARE - Administrator, Civil Service (Medical): No    Lack of Transportation (Non-Medical): No  Physical Activity: Not on file  Stress: No Stress Concern Present (07/17/2022)   Received from [facility], [facility] of Occupational Health - Occupational Stress Questionnaire    Feeling of Stress : Not at all  Social Connections: Unknown (07/16/2022)   Received from [facility], [facility]   Social Network     Social Network: Not on file   Sleep: Good  Appetite:  Good  Current Medications: Current Facility-Administered Medications  Medication Dose Route Frequency Provider Last Rate Last Admin   acetaminophen (TYLENOL) tablet 650 mg  650 mg Oral Q6H PRN [name], NP   650 mg at 05/08/23 1354   alum & mag hydroxide-simeth (MAALOX/MYLANTA) 200-200-20 MG/5ML suspension 30 mL  30 mL Oral Q4H PRN [name], NP   30 mL at 01/28/23 1530   busPIRone (BUSPAR) tablet 15 mg  15 mg Oral BID [name], NP   15 mg at 05/10/23 0857   cyanocobalamin (VITAMIN B12) injection 1,000 mcg  1,000 mcg Intramuscular Q30 days Sarita Bottom, MD   1,000 mcg at 05/03/23 1257   diphenhydrAMINE (BENADRYL) capsule 50 mg  50 mg Oral TID PRN [name], NP   50 mg at 01/15/23 1211   Or   diphenhydrAMINE (BENADRYL) injection 50 mg  50 mg Intramuscular TID PRN [name], NP       docusate sodium (COLACE) capsule 100 mg  100 mg Oral Daily [name], NP   100 mg at 05/10/23 0857   feeding supplement (ENSURE ENLIVE / ENSURE PLUS) liquid 237 mL  237 mL Oral TID BM [name], NP   237 mL at 05/10/23 1043   haloperidol (HALDOL) tablet 5 mg  5 mg Oral TID PRN [name], NP   5 mg at 05/04/23 1837   Or   haloperidol lactate (HALDOL) injection 5 mg  5 mg Intramuscular TID PRN [name], NP       hydrOXYzine (ATARAX) tablet 25 mg  25 mg Oral TID PRN [name], DO   25 mg at 05/09/23 1834   LORazepam (ATIVAN) tablet 2 mg  2 mg Oral TID PRN [name], NP   2 mg at 01/15/23 1211   Or   LORazepam (ATIVAN) injection 2 mg  2 mg Intramuscular TID PRN [name], NP       magnesium hydroxide (MILK OF MAGNESIA) suspension 30 mL  30 mL Oral Daily PRN [name], NP   30 mL at 04/13/23 0814   melatonin tablet 5 mg  5 mg Oral QHS Nkwenti, Doris, NP   5 mg at 05/09/23 2103   nicotine (NICODERM CQ - dosed in mg/24 hours) patch 21 mg  21 mg Transdermal Daily [name], DO   21 mg at 03/25/23  0981   nicotine polacrilex (NICORETTE) gum 2 mg  2 mg Oral PRN [name], MD   2 mg at 05/09/23 1834   paliperidone (INVEGA) 24 hr tablet 6 mg  6 mg Oral Daily [name], NP   6 mg at 05/09/23 2103   pantoprazole (PROTONIX) EC tablet 40 mg  40 mg Oral Daily [name], NP   40 mg at 05/10/23 0857   polyethylene glycol (MIRALAX / GLYCOLAX) packet 17 g  17 g Oral Daily PRN [name], NP   17 g at 04/13/23 1734   sertraline (ZOLOFT) tablet 150 mg  150 mg Oral Daily Massengill, Harrold Donath, MD   150 mg at 05/10/23 0857   SUMAtriptan (IMITREX) tablet 25 mg  25 mg Oral BID PRN [name], NP   25 mg at 05/07/23 2112   traZODone (DESYREL) tablet 50 mg  50 mg Oral QHS [name], NP   50 mg at 05/09/23 2103   Vitamin D (Ergocalciferol) (DRISDOL) 1.25 MG (50000 UNIT) capsule 50,000 Units  50,000 Units Oral Q7 days [name], NP   50,000 Units at 04/27/23 1612   Lab Results:  Results for orders placed or performed during the hospital encounter of 12/20/22 (from the past 48 hour(s))  CBC with Differential/Platelet     Status: None   Collection Time: 05/09/23 10:50 AM  Result Value Ref Range   WBC 5.3 4.0 - 10.5 K/uL   RBC 4.18 3.87 - 5.11 MIL/uL   Hemoglobin 12.2 12.0 - 15.0 g/dL   HCT 52.8 41.3 - 24.4 %   MCV 93.5 80.0 - 100.0 fL   MCH 29.2 26.0 - 34.0 pg   MCHC 31.2 30.0 - 36.0 g/dL   RDW 01.0 27.2 - 53.6 %   Platelets 263 150 - 400 K/uL   nRBC 0.0 0.0 - 0.2 %   Neutrophils Relative % 43 %   Neutro Abs 2.4 1.7 - 7.7 K/uL   Lymphocytes Relative 41 %   Lymphs Abs 2.2 0.7 - 4.0 K/uL   Monocytes Relative 9 %   Monocytes Absolute 0.5 0.1 - 1.0 K/uL   Eosinophils Relative 6 %   Eosinophils Absolute 0.3 0.0 - 0.5 K/uL   Basophils Relative 1 %   Basophils Absolute 0.0 0.0 - 0.1 K/uL   Immature Granulocytes 0 %   Abs Immature Granulocytes 0.01 0.00 - 0.07 K/uL    Comment: Performed at [facility], [address], [place], [place] [address]   Comprehensive metabolic panel     Status: None   Collection Time: 05/09/23 10:50 AM  Result Value Ref Range   Sodium 138 135 - 145 mmol/L   Potassium 4.2 3.5 - 5.1 mmol/L   Chloride 102 98 - 111 mmol/L   CO2 28 22 - 32 mmol/L   Glucose, Bld 95 70 - 99 mg/dL    Comment: Glucose reference range applies only to samples taken after fasting for at least 8 hours.   BUN 15 6 - 20 mg/dL   Creatinine, Ser 4.74 0.44 - 1.00 mg/dL   Calcium 9.2 8.9 - 25.9 mg/dL   Total Protein 7.7 6.5 - 8.1 g/dL   Albumin 3.7 3.5 - 5.0 g/dL   AST 19 15 - 41 U/L   ALT 19 0 - 44 U/L   Alkaline Phosphatase 87 38 - 126 U/L   Total Bilirubin 0.6 0.3 - 1.2 mg/dL   GFR, Estimated >56 >38 mL/min    Comment: (NOTE) Calculated using the CKD-EPI Creatinine Equation (2021)    Anion gap 8 5 - 15    Comment: Performed at [facility], [address] [place], [place] [address]        Blood Alcohol level:  Lab Results  Component Value Date   [facility] <10 12/19/2022   ETH <10 11/21/2019   Metabolic Disorder Labs: Lab Results  Component Value Date   HGBA1C 4.4 (L) 12/21/2022   MPG 79.58 12/21/2022   No  results found for: "PROLACTIN" Lab Results  Component Value Date   CHOL 218 (H) 12/21/2022   TRIG 81 12/21/2022   HDL 53 12/21/2022   CHOLHDL 4.1 12/21/2022   VLDL 16 12/21/2022   LDLCALC 149 (H) 12/21/2022   LDLCALC 110 (H) 03/13/2011   Physical Findings: AIMS: Facial and Oral Movements Muscles of Facial Expression: None Lips and Perioral Area: None Jaw: None Tongue: None,Extremity Movements Upper (arms, wrists, hands, fingers): None Lower (legs, knees, ankles, toes): None, Trunk Movements Neck, shoulders, hips: None, Global Judgements Severity of abnormal movements overall : None Incapacitation due to abnormal movements: None Patient's awareness of abnormal movements: No Awareness, Dental Status Current problems with teeth and/or dentures?: No Does patient usually wear dentures?:  No  CIWA:    COWS:    AIMS:0 Musculoskeletal: Strength & Muscle Tone: within normal limits Gait & Station: normal Patient leans: N/A  Psychiatric Specialty Exam:  Presentation  General Appearance:  Casual; Disheveled  Eye Contact: Limited to baseline Speech: Decreased amount, decreased tone and volume but at baseline Speech Volume: Decreased  Handedness: Right  Mood and Affect  Mood: Euthymic, pleasant, calm Affect: Blunt; Restricted At baseline Thought Process  Thought Processes: Linear  Descriptions of Associations:Intact  Orientation:Full (Time, Place and Person)  Thought Content:Rumination Concrete History of Schizophrenia/Schizoaffective disorder:Yes  Duration of Psychotic Symptoms:Greater than six months  Hallucinations:No data recorded     Ideas of Reference: None  Suicidal Thoughts:No data recorded     Homicidal Thoughts:No data recorded    Sensorium  Memory: Immediate Fair; Recent Poor; Remote Fair  Judgment: Fair  Insight: Fair  Chartered certified accountant: Fair  Attention Span: Fair  Recall: [employer] of Knowledge: Fair  Language: Fair  Psychomotor Activity  Psychomotor Activity: No data recorded     Assets  Assets: Communication Skills; Desire for Improvement  Sleep  Sleep: No data recorded     Physical Exam: Physical Exam Vitals and nursing note reviewed.  Cardiovascular:     Rate and Rhythm: Normal rate.  Pulmonary:     Effort: Pulmonary effort is normal.  Genitourinary:    Comments: Deferred Musculoskeletal:        General: Normal range of motion.     Cervical back: Normal range of motion.  Skin:    General: Skin is warm and dry.  Neurological:     General: No focal deficit present.     Mental Status: She is alert and oriented to person, place, and time.  Psychiatric:        Mood and Affect: Mood normal.        Behavior: Behavior normal.        Thought Content: Thought  content normal.    Review of Systems  Constitutional:  Negative for fever.  Respiratory:  Negative for cough, shortness of breath and wheezing.   Gastrointestinal:  Negative for abdominal pain, constipation and vomiting.  Genitourinary:        Pain to light touch on the R side  Skin:  Negative for rash.  Neurological:  Negative for dizziness.  Psychiatric/Behavioral:  Negative for depression, hallucinations, memory loss, substance abuse and suicidal ideas. The patient is not nervous/anxious and does not have insomnia.   All other systems reviewed and are negative.  Blood pressure 97/62, pulse 88, temperature 98.2 F (36.8 C), temperature source Oral, resp. rate 12, height 4\' 11"  (1.499 m), weight 63 kg, SpO2 99%. Body mass index is 28.05 kg/m.  Treatment Plan Summary: Daily contact  with patient to assess and evaluate symptoms and progress in treatment and Medication management.   Will continue to monitor side pain, patient is reporting patient was sensitive to light touch on the area. At this time it may be 2/2 to patient constantly lying on that side. Patient not having urinary concerns currently.    Still waiting on safe disposition as patient would not be able to live independently given cognitive impairment. Recommend assisted living.     No medication side effects reported.  Because of urinary incontinence, recommended adult diapers to be worn.   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  Plan:  -Continue Imitrex 25 mg PRN BID for migraines -Continue Colace 100 mg daily for constipation -Continue Vitamin D 50.000 units weekly for bone health. -Continue Sertraline150 mg po Q daily for depression/anxiety -Continue Buspar 15 mg po bid for  anxiety.  -Continue paliperidone 6 mg po qd for mood stabilization -Continue Trazodone 50 mg nightly for insomnia  -Continue Nicoderm 21 mg topically Q 24 hrs for nicotine withdrawal management. -Continue vitamin B12 1000 mcg IM weekly for 4 weeks then monthly -Continue Melatonin 5 mg nightly for sleep -Continue Protonix EC 40 mg p.o. daily for GERD -Continue MiraLAX 17 g p.o. PRN for constipation -Continue hydroxyzine 25 mg p.o. 3 times daily as needed for anxiety -Continue Ensure nutritional shakes TID in between meals  -Previously discontinued Hydroxyzine 50 mg nightly-hypotension in the mornings   10/31 CBC within normal level, CMP within normal level  Safety and Monitoring: Voluntary admission to inpatient psychiatric unit for safety, stabilization and treatment Daily contact with patient to assess and evaluate symptoms and progress in treatment Patient's case to be discussed in multi-disciplinary team meeting Observation Level : q15 minute checks Vital signs: q12 hours Precautions: Safety   Discharge Planning: Social work and case management to assist with discharge planning and identification of hospital follow-up needs prior to discharge Estimated LOS: Unknown at this time. Discharge Concerns: Need to establish a safety plan; Medication compliance and effectiveness Discharge Goals: Return home with outpatient referrals for mental health follow-up including medication management/psychotherapy No legal guardian, has payee Total Time Spent in Direct Patient Care:   I personally spent 25 minutes on the unit in direct patient care. The direct patient care time included face-to-face time with the patient, reviewing the patient's chart, communicating with other professionals, and coordinating care. Greater than 50% of this time was spent in counseling or coordinating care with the patient regarding goals of hospitalization, psycho-education, and discharge planning needs.   Jenne Sellinger  [name], MD,  05/10/2023, 11:40 AM

## 2023-05-10 NOTE — [facility] Group Notes (Signed)
[facility] Group Notes:  (Nursing/MHT/Case Management/Adjunct)  Date:  05/10/2023  Time:  9:14 PM  Type of Therapy:   Wrap-up [id number]  Participation Level:  Active  Participation Quality:  Appropriate  Affect:  Appropriate  Cognitive:  Appropriate  Insight:  Appropriate  Engagement in Group:  Engaged  Modes of Intervention:  Education  Summary of Progress/Problems: Pt reports no goal. Rated her day 5/10.  [name] 05/10/2023, 9:14 PM

## 2023-05-10 NOTE — Group Note (Signed)
Recreation Therapy Group Note   Group Topic:Stress Management  Group Date: 05/10/2023 Start Time: 0930 End Time: 0955 Facilitators: Verania Salberg-McCall, LRT,CTRS Location: [address] Dayroom   Group Topic: Stress Management  Goal Area(s) Addresses:  Patient will identify positive stress management techniques. Patient will identify benefits of using stress management post d/c.  Activity: Meditation. LRT explained to patients the essence of meditation and to get as relaxed as possible. LRT played a meditation that guided patients to identify things that are holding them back from reaching the goals they see for themselves.   Education:  Stress Management, Discharge Planning.   Education Outcome: Acknowledges Education   Affect/Mood: N/A   Participation Level: Did not attend    Clinical Observations/Individualized Feedback:    Plan: Continue to engage patient in RT group sessions 2-3x/week.   Yolanda [name], LRT,CTRS 05/10/2023 12:45 PM

## 2023-05-10 NOTE — Progress Notes (Signed)
   05/10/23 0600  15 Minute Checks  Location Bedroom  Visual Appearance Calm  Behavior Sleeping  Sleep (Behavioral Health Patients Only)  Calculate sleep? (Click Yes once per 24 hr at 0600 safety check) Yes  Documented sleep last 24 hours 9.25

## 2023-05-10 NOTE — Progress Notes (Signed)
   05/10/23 1300  Psych Admission Type (Psych Patients Only)  Admission Status Involuntary  Psychosocial Assessment  Patient Complaints Sadness  Eye Contact Fair  Facial Expression Flat  Affect Sad  Speech Slow  Interaction Childlike;Assertive  Motor Activity Slow;Tremors  Appearance/Hygiene Designer, industrial/product Cooperative  Mood Sad  Thought Process  Coherency WDL  Content WDL  Delusions None reported or observed  Perception WDL  Hallucination None reported or observed  Judgment Impaired  Confusion None  Danger to Self  Current suicidal ideation? Denies  Self-Injurious Behavior No self-injurious ideation or behavior indicators observed or expressed   Agreement Not to Harm Self Yes  Description of Agreement verbal  Danger to Others  Danger to Others None reported or observed

## 2023-05-11 DIAGNOSIS — F251 Schizoaffective disorder, depressive type: Secondary | ICD-10-CM | POA: Diagnosis not present

## 2023-05-11 NOTE — [facility] Group Notes (Signed)
[facility] Group Notes:  (Nursing/MHT/Case Management/Adjunct)  Date:  05/11/2023  Time:  9:16 PM  Type of Therapy:   Wrap-up group  Participation Level:  Active  Participation Quality:  Appropriate  Affect:  Appropriate  Cognitive:  Appropriate  Insight:  Appropriate  Engagement in Group:  Engaged  Modes of Intervention:  Education  Summary of Progress/Problems: Pt goal to work on [employer]. Rated day 8/10.  Yolanda [name] 05/11/2023, 9:16 PM

## 2023-05-11 NOTE — Progress Notes (Signed)
[facility] MD Progress Note  05/11/2023 9:21 AM Yolanda [name]  MRN:  [id number]  Principal Problem: Schizoaffective disorder, depressive type (HCC)  Diagnosis: Principal Problem:   Schizoaffective disorder, depressive type (HCC) Active Problems:   GERD (gastroesophageal reflux disease)   Intellectual disability   Tobacco use disorder  Reason for admission: This is the first psychiatric admission in this [facility] in 12 years for this 61 AA female with an extensive hx of mental illnesses & probable polysubstance use disorders. She is admitted to the [facility] from the [facility] hospital with complain of worsening suicidal ideations with plan to stab herself. Per chart review, patient apparently reported at the ED that she has been depressed for a while & has not been taking her mental health medications. After medical evaluation.clearance, she was transferred to the [facility] for further psychiatric evaluation/treatments.   Patient assessment note: Patient continues to comply with medications on the unit, denies side effects with them.  Upon evaluation today she continues to report stable mood and continues to deny passive or active SI intention or plan and reports last time felt suicidal "long time ago" she denies auditory or visual hallucinations when asked and does not appear responding to stimuli no paranoia or other delusions noted.  She reports fair sleep and appetite.  Patient presents smiling with improvement with that she can have her toddler grandson visit once, she reports she will call her daughter and the plan for her to happen this weekend if daughter can bring him.    Still awaiting placement  Total Time spent with patient:  25 minutes  Past Psychiatric History:  See H & P  Past Medical History:  Past Medical History:  Diagnosis Date   Bipolar affect, depressed (HCC)    Constipation 08/17/2022   Depression    Falls 07/21/2022   Fracture of femoral neck, right, closed (HCC) 01/17/2022    Herpes simplex 08/22/2017   Open fracture dislocation of right elbow joint 01/17/2022    Past Surgical History:  Procedure Laterality Date   NO PAST SURGERIES     SALPINGECTOMY     Family History: History reviewed. No pertinent family history.  Family Psychiatric  History: See H & P  Social History:  Social History   Substance and Sexual Activity  Alcohol Use Yes     Social History   Substance and Sexual Activity  Drug Use Yes   Types: Cocaine, Marijuana    Social History   Socioeconomic History   Marital status: Single    Spouse name: Not on file   Number of children: Not on file   Years of education: Not on file   Highest education level: Not on file  Occupational History   Not on file  Tobacco Use   Smoking status: Every Day   Smokeless tobacco: Not on file  Substance and Sexual Activity   Alcohol use: Yes   Drug use: Yes    Types: Cocaine, Marijuana   Sexual activity: Yes  Other Topics Concern   Not on file  Social History Narrative   Not on file   Social Determinants of Health   Financial Resource Strain: Low Risk  (09/12/2022)   Received from [facility], [facility]   Overall Financial Resource Strain (CARDIA)    Difficulty of Paying Living Expenses: Not hard at all  Food Insecurity: Patient Declined (12/20/2022)   Hunger Vital Sign    Worried About Running Out of Food in the Last Year: Patient declined  Ran Out of Food in the Last Year: Patient declined  Transportation Needs: No Transportation Needs (12/20/2022)   PRAPARE - Administrator, Civil Service (Medical): No    Lack of Transportation (Non-Medical): No  Physical Activity: Not on file  Stress: No Stress Concern Present (07/17/2022)   Received from [facility], [facility] - Occupational Stress Questionnaire    Feeling of Stress : Not at all  Social Connections: Unknown (07/16/2022)   Received from [facility], [facility]    Social Network    Social Network: Not on file   Sleep: Good  Appetite:  Good  Current Medications: Current Facility-Administered Medications  Medication Dose Route Frequency Provider Last Rate Last Admin   acetaminophen (TYLENOL) tablet 650 mg  650 mg Oral Q6H PRN [name], NP   650 mg at 05/08/23 1354   alum & mag hydroxide-simeth (MAALOX/MYLANTA) 200-200-20 MG/5ML suspension 30 mL  30 mL Oral Q4H PRN [name], NP   30 mL at 01/28/23 1530   busPIRone (BUSPAR) tablet 15 mg  15 mg Oral BID [name], NP   15 mg at 05/11/23 0839   cyanocobalamin (VITAMIN B12) injection 1,000 mcg  1,000 mcg Intramuscular Q30 days Sarita Bottom, MD   1,000 mcg at 05/03/23 1257   diphenhydrAMINE (BENADRYL) capsule 50 mg  50 mg Oral TID PRN [name], NP   50 mg at 01/15/23 1211   Or   diphenhydrAMINE (BENADRYL) injection 50 mg  50 mg Intramuscular TID PRN [name], NP       docusate sodium (COLACE) capsule 100 mg  100 mg Oral Daily [name], NP   100 mg at 05/11/23 0839   feeding supplement (ENSURE ENLIVE / ENSURE PLUS) liquid 237 mL  237 mL Oral TID BM [name], NP   237 mL at 05/10/23 2126   haloperidol (HALDOL) tablet 5 mg  5 mg Oral TID PRN [name], NP   5 mg at 05/04/23 1837   Or   haloperidol lactate (HALDOL) injection 5 mg  5 mg Intramuscular TID PRN [name], NP       hydrOXYzine (ATARAX) tablet 25 mg  25 mg Oral TID PRN [name], DO   25 mg at 05/09/23 1834   LORazepam (ATIVAN) tablet 2 mg  2 mg Oral TID PRN [name], NP   2 mg at 01/15/23 1211   Or   LORazepam (ATIVAN) injection 2 mg  2 mg Intramuscular TID PRN [name], NP       magnesium hydroxide (MILK OF MAGNESIA) suspension 30 mL  30 mL Oral Daily PRN [name], NP   30 mL at 04/13/23 0814   melatonin tablet 5 mg  5 mg Oral QHS Nkwenti, Doris, NP   5 mg at 05/10/23 2125   nicotine (NICODERM CQ - dosed in mg/24 hours) patch 21 mg  21 mg Transdermal Daily [name], DO   21 mg  at 03/25/23 4270   nicotine polacrilex (NICORETTE) gum 2 mg  2 mg Oral PRN [name], MD   2 mg at 05/10/23 1822   paliperidone (INVEGA) 24 hr tablet 6 mg  6 mg Oral Daily [name], NP   6 mg at 05/10/23 2125   pantoprazole (PROTONIX) EC tablet 40 mg  40 mg Oral Daily [name], NP   40 mg at 05/11/23 0839   polyethylene glycol (MIRALAX / GLYCOLAX) packet 17 g  17 g Oral Daily PRN [name], NP   17 g at 04/13/23 1734   sertraline (ZOLOFT) tablet 150 mg  150 mg Oral Daily Massengill, Harrold Donath, MD   150 mg at 05/11/23 0839   SUMAtriptan (IMITREX) tablet 25 mg  25 mg Oral BID PRN [name], NP   25 mg at 05/07/23 2112   traZODone (DESYREL) tablet 50 mg  50 mg Oral QHS [name], NP   50 mg at 05/10/23 2125   Vitamin D (Ergocalciferol) (DRISDOL) 1.25 MG (50000 UNIT) capsule 50,000 Units  50,000 Units Oral Q7 days [name], NP   50,000 Units at 04/27/23 1612   Lab Results:  Results for orders placed or performed during the hospital encounter of 12/20/22 (from the past 48 hour(s))  CBC with Differential/Platelet     Status: None   Collection Time: 05/09/23 10:50 AM  Result Value Ref Range   WBC 5.3 4.0 - 10.5 K/uL   RBC 4.18 3.87 - 5.11 MIL/uL   Hemoglobin 12.2 12.0 - 15.0 g/dL   HCT 16.1 09.6 - 04.5 %   MCV 93.5 80.0 - 100.0 fL   MCH 29.2 26.0 - 34.0 pg   MCHC 31.2 30.0 - 36.0 g/dL   RDW 40.9 81.1 - 91.4 %   Platelets 263 150 - 400 K/uL   nRBC 0.0 0.0 - 0.2 %   Neutrophils Relative % 43 %   Neutro Abs 2.4 1.7 - 7.7 K/uL   Lymphocytes Relative 41 %   Lymphs Abs 2.2 0.7 - 4.0 K/uL   Monocytes Relative 9 %   Monocytes Absolute 0.5 0.1 - 1.0 K/uL   Eosinophils Relative 6 %   Eosinophils Absolute 0.3 0.0 - 0.5 K/uL   Basophils Relative 1 %   Basophils Absolute 0.0 0.0 - 0.1 K/uL   Immature Granulocytes 0 %   Abs Immature Granulocytes 0.01 0.00 - 0.07 K/uL    Comment: Performed at [facility], [address] [place], [place]  [address]  Comprehensive metabolic panel     Status: None   Collection Time: 05/09/23 10:50 AM  Result Value Ref Range   Sodium 138 135 - 145 mmol/L   Potassium 4.2 3.5 - 5.1 mmol/L   Chloride 102 98 - 111 mmol/L   CO2 28 22 - 32 mmol/L   Glucose, Bld 95 70 - 99 mg/dL    Comment: Glucose reference range applies only to samples taken after fasting for at least 8 hours.   BUN 15 6 - 20 mg/dL   Creatinine, Ser 6.21 0.44 - 1.00 mg/dL   Calcium 9.2 8.9 - 30.8 mg/dL   Total Protein 7.7 6.5 - 8.1 g/dL   Albumin 3.7 3.5 - 5.0 g/dL   AST 19 15 - 41 U/L   ALT 19 0 - 44 U/L   Alkaline Phosphatase 87 38 - 126 U/L   Total Bilirubin 0.6 0.3 - 1.2 mg/dL   GFR, Estimated >65 >78 mL/min    Comment: (NOTE) Calculated using the CKD-EPI Creatinine Equation (2021)    Anion gap 8 5 - 15    Comment: Performed at [facility], [address], [place], [place] [address]        Blood Alcohol level:  Lab Results  Component Value Date   [facility] <10 12/19/2022   ETH <10 11/21/2019   Metabolic Disorder Labs: Lab Results  Component Value Date   HGBA1C 4.4 (L) 12/21/2022   MPG 79.58 12/21/2022   No  results found for: "PROLACTIN" Lab Results  Component Value Date   CHOL 218 (H) 12/21/2022   TRIG 81 12/21/2022   HDL 53 12/21/2022   CHOLHDL 4.1 12/21/2022   VLDL 16 12/21/2022   LDLCALC 149 (H) 12/21/2022   LDLCALC 110 (H) 03/13/2011   Physical Findings: AIMS: Facial and Oral Movements Muscles of Facial Expression: None Lips and Perioral Area: None Jaw: None Tongue: None,Extremity Movements Upper (arms, wrists, hands, fingers): None Lower (legs, knees, ankles, toes): None, Trunk Movements Neck, shoulders, hips: None, Global Judgements Severity of abnormal movements overall : None Incapacitation due to abnormal movements: None Patient's awareness of abnormal movements: No Awareness, Dental Status Current problems with teeth and/or dentures?: No Does patient usually wear  dentures?: No  CIWA:    COWS:    AIMS:0 Musculoskeletal: Strength & Muscle Tone: within normal limits Gait & Station: normal Patient leans: N/A  Psychiatric Specialty Exam:  Presentation  General Appearance:  Casual; Disheveled  Eye Contact: Limited to baseline Speech: Decreased amount, decreased tone and volume but at baseline Speech Volume: Decreased  Handedness: Right  Mood and Affect  Mood: Euthymic, pleasant, calm Affect: Blunt; Restricted At baseline Thought Process  Thought Processes: Linear  Descriptions of Associations:Intact  Orientation:Full (Time, Place and Person)  Thought Content:Rumination Concrete History of Schizophrenia/Schizoaffective disorder:Yes  Duration of Psychotic Symptoms:Greater than six months  Hallucinations:No data recorded     Ideas of Reference: None  Suicidal Thoughts:No data recorded     Homicidal Thoughts:No data recorded    Sensorium  Memory: Immediate Fair; Recent Poor; Remote Fair  Judgment: Fair  Insight: Fair  Chartered certified accountant: Fair  Attention Span: Fair  Recall: [employer] of Knowledge: Fair  Language: Fair  Psychomotor Activity  Psychomotor Activity: No data recorded     Assets  Assets: Communication Skills; Desire for Improvement  Sleep  Sleep: No data recorded     Physical Exam: Physical Exam Vitals and nursing note reviewed.  Cardiovascular:     Rate and Rhythm: Normal rate.  Pulmonary:     Effort: Pulmonary effort is normal.  Genitourinary:    Comments: Deferred Musculoskeletal:        General: Normal range of motion.     Cervical back: Normal range of motion.  Skin:    General: Skin is warm and dry.  Neurological:     General: No focal deficit present.     Mental Status: She is alert and oriented to person, place, and time.  Psychiatric:        Mood and Affect: Mood normal.        Behavior: Behavior normal.        Thought  Content: Thought content normal.    Review of Systems  Constitutional:  Negative for fever.  Respiratory:  Negative for cough, shortness of breath and wheezing.   Gastrointestinal:  Negative for abdominal pain, constipation and vomiting.  Genitourinary:        Pain to light touch on the R side  Skin:  Negative for rash.  Neurological:  Negative for dizziness.  Psychiatric/Behavioral:  Negative for depression, hallucinations, memory loss, substance abuse and suicidal ideas. The patient is not nervous/anxious and does not have insomnia.   All other systems reviewed and are negative.  Blood pressure 95/64, pulse 67, temperature 98 F (36.7 C), temperature source Oral, resp. rate 12, height 4\' 11"  (1.499 m), weight 63 kg, SpO2 95%. Body mass index is 28.05 kg/m.  Treatment Plan Summary: Daily contact  with patient to assess and evaluate symptoms and progress in treatment and Medication management.   Will continue to monitor side pain, patient is reporting patient was sensitive to light touch on the area. At this time it may be 2/2 to patient constantly lying on that side. Patient not having urinary concerns currently.    Still waiting on safe disposition as patient would not be able to live independently given cognitive impairment. Recommend assisted living.     No medication side effects reported.  Because of urinary incontinence, recommended adult diapers to be worn.   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  Plan:  -Continue Imitrex 25 mg PRN BID for migraines -Continue Colace 100 mg daily for constipation -Continue Vitamin D 50.000 units weekly for bone health. -Continue Sertraline150 mg po Q daily for depression/anxiety -Continue Buspar 15 mg  po bid for anxiety.  -Continue paliperidone 6 mg po qd for mood stabilization -Continue Trazodone 50 mg nightly for insomnia  -Continue Nicoderm 21 mg topically Q 24 hrs for nicotine withdrawal management. -Continue vitamin B12 1000 mcg IM weekly for 4 weeks then monthly -Continue Melatonin 5 mg nightly for sleep -Continue Protonix EC 40 mg p.o. daily for GERD -Continue MiraLAX 17 g p.o. PRN for constipation -Continue hydroxyzine 25 mg p.o. 3 times daily as needed for anxiety -Continue Ensure nutritional shakes TID in between meals  -Previously discontinued Hydroxyzine 50 mg nightly-hypotension in the mornings   10/31 CBC within normal level, CMP within normal level  Safety and Monitoring: Voluntary admission to inpatient psychiatric unit for safety, stabilization and treatment Daily contact with patient to assess and evaluate symptoms and progress in treatment Patient's case to be discussed in multi-disciplinary team meeting Observation Level : q15 minute checks Vital signs: q12 hours Precautions: Safety   Discharge Planning: Social work and case management to assist with discharge planning and identification of hospital follow-up needs prior to discharge Estimated LOS: Unknown at this time. Discharge Concerns: Need to establish a safety plan; Medication compliance and effectiveness Discharge Goals: Return home with outpatient referrals for mental health follow-up including medication management/psychotherapy No legal guardian, has payee Total Time Spent in Direct Patient Care:   I personally spent 25 minutes on the unit in direct patient care. The direct patient care time included face-to-face time with the patient, reviewing the patient's chart, communicating with other professionals, and coordinating care. Greater than 50% of this time was spent in counseling or coordinating care with the patient regarding goals of hospitalization, psycho-education, and discharge planning needs.    Raychelle Hudman [name], MD,  05/11/2023, 9:21 AM

## 2023-05-11 NOTE — [facility] Group Notes (Signed)
[facility] Group Notes:  (Nursing)  Date:  05/11/2023  Time:  7:00 PM  Type of Therapy:  Psychoeducational Skills  Participation Level:  Minimal  Participation Quality:  Appropriate and Attentive  Affect:  Appropriate  Cognitive:  Appropriate  Insight:  Improving  Engagement in Group:  Engaged and Improving  Modes of Intervention:  Discussion, Exploration, Problem-solving, Rapport Building, Socialization, and Support  Summary of Progress/Problems:  Shela Nevin 05/11/2023, 7:00 PM

## 2023-05-11 NOTE — Plan of Care (Signed)
  Problem: Activity: Goal: Interest or engagement in leisure activities will improve Outcome: Progressing   Problem: Coping: Goal: Will verbalize feelings Outcome: Progressing   Problem: Health Behavior/Discharge Planning: Goal: Compliance with therapeutic regimen will improve Outcome: Progressing

## 2023-05-11 NOTE — Progress Notes (Signed)
   05/11/23 0600  15 Minute Checks  Location Bedroom  Visual Appearance Calm  Behavior Sleeping  Sleep (Behavioral Health Patients Only)  Calculate sleep? (Click Yes once per 24 hr at 0600 safety check) Yes  Documented sleep last 24 hours 7.5

## 2023-05-11 NOTE — Progress Notes (Signed)
   05/10/23 2125  Psych Admission Type (Psych Patients Only)  Admission Status Involuntary  Psychosocial Assessment  Patient Complaints Sadness  Eye Contact Fair  Facial Expression Flat  Affect Sad  Speech Logical/coherent;Soft  Interaction Childlike;Assertive  Motor Activity Slow;Tremors  Appearance/Hygiene Disheveled;Poor Media planner Cooperative  Mood Sad  Thought Process  Coherency WDL  Content WDL  Delusions None reported or observed  Perception WDL  Hallucination None reported or observed  Judgment Impaired  Confusion None  Danger to Self  Current suicidal ideation? Denies  Self-Injurious Behavior No self-injurious ideation or behavior indicators observed or expressed   Agreement Not to Harm Self Yes  Description of Agreement verbal  Danger to Others  Danger to Others None reported or observed

## 2023-05-11 NOTE — Progress Notes (Signed)
   05/11/23 1500  Psych Admission Type (Psych Patients Only)  Admission Status Involuntary  Psychosocial Assessment  Patient Complaints Depression;Sadness  Eye Contact Fair  Facial Expression Sad  Affect Sad  Speech Logical/coherent;Soft  Interaction Childlike;Assertive  Motor Activity Slow;Tremors  Appearance/Hygiene Poor hygiene;Disheveled  Behavior Characteristics Cooperative  Mood Labile;Sad  Thought Process  Coherency WDL  Content WDL  Delusions None reported or observed  Perception WDL  Hallucination None reported or observed  Judgment Impaired  Confusion None  Danger to Self  Current suicidal ideation? Denies  Self-Injurious Behavior No self-injurious ideation or behavior indicators observed or expressed   Agreement Not to Harm Self Yes  Description of Agreement agreed to contact staff before acting on harmful thoughts  Danger to Others  Danger to Others None reported or observed

## 2023-05-11 NOTE — Plan of Care (Signed)
  Problem: Education: Goal: Utilization of techniques to improve thought processes will improve Outcome: Progressing Goal: Knowledge of the prescribed therapeutic regimen will improve Outcome: Progressing   

## 2023-05-12 DIAGNOSIS — F251 Schizoaffective disorder, depressive type: Secondary | ICD-10-CM | POA: Diagnosis not present

## 2023-05-12 NOTE — Plan of Care (Signed)
  Problem: Activity: Goal: Imbalance in normal sleep/wake cycle will improve Outcome: Progressing   Problem: Coping: Goal: Coping ability will improve Outcome: Progressing   

## 2023-05-12 NOTE — Progress Notes (Signed)
   05/12/23 1610  15 Minute Checks  Location Bedroom  Visual Appearance Calm  Behavior Sleeping  Sleep (Behavioral Health Patients Only)  Calculate sleep? (Click Yes once per 24 hr at 0600 safety check) Yes  Documented sleep last 24 hours 8.5

## 2023-05-12 NOTE — [facility] Group Notes (Signed)
Did not participate in orientation group

## 2023-05-12 NOTE — [facility] Group Notes (Signed)
Pt did not participate in emotional wellness group

## 2023-05-12 NOTE — Group Note (Signed)
Date:  05/12/2023 Time:  9:22 PM  Group Topic/Focus:  Wrap-Up Group:   The focus of this group is to help patients review their daily goal of treatment and discuss progress on daily workbooks.    Participation Level:  Minimal  Participation Quality:  Appropriate  Affect:  Flat  Cognitive:  Lacking  Insight: Good  Engagement in Group:  Engaged  Modes of Intervention:  Discussion and Socialization  Additional Comments:  pt stated that overall day was a 3 and that she didn't want to share why it was a 3. However pt stated that today she was able to study due to trying to obtain GED within the next month.   Bing Plume D 05/12/2023, 9:22 PM

## 2023-05-12 NOTE — Progress Notes (Signed)
   05/12/23 1300  Psych Admission Type (Psych Patients Only)  Admission Status Involuntary  Psychosocial Assessment  Patient Complaints Sadness  Eye Contact Fair  Facial Expression Animated  Affect Appropriate to circumstance  Speech Logical/coherent;Soft  Interaction Childlike  Motor Activity Slow;Tremors  Appearance/Hygiene Disheveled  Behavior Characteristics Cooperative  Mood Sad;Pleasant  Thought Process  Coherency WDL  Content WDL  Delusions None reported or observed  Perception WDL  Hallucination None reported or observed  Judgment Impaired  Confusion None  Danger to Self  Current suicidal ideation? Denies  Danger to Others  Danger to Others None reported or observed

## 2023-05-12 NOTE — Progress Notes (Signed)
   05/11/23 2105  Psych Admission Type (Psych Patients Only)  Admission Status Involuntary  Psychosocial Assessment  Patient Complaints Crying spells;Sadness  Eye Contact Fair  Facial Expression Flat  Affect Sad  Speech Logical/coherent;Soft  Interaction Childlike;Assertive  Motor Activity Slow;Tremors  Appearance/Hygiene Disheveled;Poor Media planner Cooperative  Mood Sad  Thought Process  Coherency WDL  Content WDL  Delusions None reported or observed  Perception WDL  Hallucination None reported or observed  Judgment Impaired  Confusion None  Danger to Self  Current suicidal ideation? Denies  Self-Injurious Behavior No self-injurious ideation or behavior indicators observed or expressed   Agreement Not to Harm Self Yes  Description of Agreement verbal  Danger to Others  Danger to Others None reported or observed

## 2023-05-12 NOTE — Progress Notes (Signed)
[facility] MD Progress Note  05/12/2023 9:47 AM Yolanda [name]  MRN:  [id number]  Principal Problem: Schizoaffective disorder, depressive type (HCC)  Diagnosis: Principal Problem:   Schizoaffective disorder, depressive type (HCC) Active Problems:   GERD (gastroesophageal reflux disease)   Intellectual disability   Tobacco use disorder  Reason for admission: This is the first psychiatric admission in this [facility] in 12 years for this 25 AA female with an extensive hx of mental illnesses & probable polysubstance use disorders. She is admitted to the [facility] from the [facility] hospital with complain of worsening suicidal ideations with plan to stab herself. Per chart review, patient apparently reported at the ED that she has been depressed for a while & has not been taking her mental health medications. After medical evaluation.clearance, she was transferred to the [facility] for further psychiatric evaluation/treatments.   Patient assessment note: Patient continues to comply with medications on the unit, denies side effects with them.  Upon evaluation today she continues to report stable mood and continues to deny passive or active SI intention or plan and reports last time felt suicidal "long time ago" she denies auditory or visual hallucinations when asked and does not appear responding to stimuli no paranoia or other delusions noted.  She reports fair sleep and appetite.  Patient reports she spoke with her daughter who plans to bring patient's grandson for a visit on Monday or Tuesday, will follow.  Per chart review no as needed medication needed or given for agitation or aggression, as needed Atarax for anxiety using average once daily with last use 10/31  Still awaiting placement  Total Time spent with patient:  25 minutes  Past Psychiatric History:  See H & P  Past Medical History:  Past Medical History:  Diagnosis Date   Bipolar affect, depressed (HCC)    Constipation 08/17/2022   Depression     Falls 07/21/2022   Fracture of femoral neck, right, closed (HCC) 01/17/2022   Herpes simplex 08/22/2017   Open fracture dislocation of right elbow joint 01/17/2022    Past Surgical History:  Procedure Laterality Date   NO PAST SURGERIES     SALPINGECTOMY     Family History: History reviewed. No pertinent family history.  Family Psychiatric  History: See H & P  Social History:  Social History   Substance and Sexual Activity  Alcohol Use Yes     Social History   Substance and Sexual Activity  Drug Use Yes   Types: Cocaine, Marijuana    Social History   Socioeconomic History   Marital status: Single    Spouse name: Not on file   Number of children: Not on file   Years of education: Not on file   Highest education level: Not on file  Occupational History   Not on file  Tobacco Use   Smoking status: Every Day   Smokeless tobacco: Not on file  Substance and Sexual Activity   Alcohol use: Yes   Drug use: Yes    Types: Cocaine, Marijuana   Sexual activity: Yes  Other Topics Concern   Not on file  Social History Narrative   Not on file   Social Determinants of Health   Financial Resource Strain: Low Risk  (09/12/2022)   Received from [facility], [facility]   Overall Financial Resource Strain (CARDIA)    Difficulty of Paying Living Expenses: Not hard at all  Food Insecurity: Patient Declined (12/20/2022)   Hunger Vital Sign    Worried  About Running Out of Food in the Last Year: Patient declined    Ran Out of Food in the Last Year: Patient declined  Transportation Needs: No Transportation Needs (12/20/2022)   PRAPARE - Administrator, Civil Service (Medical): No    Lack of Transportation (Non-Medical): No  Physical Activity: Not on file  Stress: No Stress Concern Present (07/17/2022)   Received from [facility], [facility] of Occupational Health - Occupational Stress Questionnaire    Feeling of Stress : Not at all  Social  Connections: Unknown (07/16/2022)   Received from [facility], [facility]   Social Network    Social Network: Not on file   Sleep: Good  Appetite:  Good  Current Medications: Current Facility-Administered Medications  Medication Dose Route Frequency Provider Last Rate Last Admin   acetaminophen (TYLENOL) tablet 650 mg  650 mg Oral Q6H PRN [name], NP   650 mg at 05/08/23 1354   alum & mag hydroxide-simeth (MAALOX/MYLANTA) 200-200-20 MG/5ML suspension 30 mL  30 mL Oral Q4H PRN [name], NP   30 mL at 01/28/23 1530   busPIRone (BUSPAR) tablet 15 mg  15 mg Oral BID [name], NP   15 mg at 05/12/23 8469   cyanocobalamin (VITAMIN B12) injection 1,000 mcg  1,000 mcg Intramuscular Q30 days Sarita Bottom, MD   1,000 mcg at 05/03/23 1257   diphenhydrAMINE (BENADRYL) capsule 50 mg  50 mg Oral TID PRN [name], NP   50 mg at 01/15/23 1211   Or   diphenhydrAMINE (BENADRYL) injection 50 mg  50 mg Intramuscular TID PRN [name], NP       docusate sodium (COLACE) capsule 100 mg  100 mg Oral Daily Nkwenti, Tyler Aas, NP   100 mg at 05/12/23 6295   feeding supplement (ENSURE ENLIVE / ENSURE PLUS) liquid 237 mL  237 mL Oral TID BM [name], NP   237 mL at 05/11/23 2105   haloperidol (HALDOL) tablet 5 mg  5 mg Oral TID PRN [name], NP   5 mg at 05/04/23 1837   Or   haloperidol lactate (HALDOL) injection 5 mg  5 mg Intramuscular TID PRN [name], NP       hydrOXYzine (ATARAX) tablet 25 mg  25 mg Oral TID PRN [name], DO   25 mg at 05/09/23 1834   LORazepam (ATIVAN) tablet 2 mg  2 mg Oral TID PRN [name], NP   2 mg at 01/15/23 1211   Or   LORazepam (ATIVAN) injection 2 mg  2 mg Intramuscular TID PRN [name], NP       magnesium hydroxide (MILK OF MAGNESIA) suspension 30 mL  30 mL Oral Daily PRN [name], NP   30 mL at 04/13/23 0814   melatonin tablet 5 mg  5 mg Oral QHS Nkwenti, Doris, NP   5 mg at 05/11/23 2105   nicotine (NICODERM CQ -  dosed in mg/24 hours) patch 21 mg  21 mg Transdermal Daily [name], DO   21 mg at 03/25/23 2841   nicotine polacrilex (NICORETTE) gum 2 mg  2 mg Oral PRN [name], MD   2 mg at 05/11/23 1232   paliperidone (INVEGA) 24 hr tablet 6 mg  6 mg Oral Daily [name], NP   6 mg at 05/11/23 2105   pantoprazole (PROTONIX) EC tablet 40 mg  40 mg Oral Daily Sanjuana Kava, NP   40  mg at 05/12/23 0823   polyethylene glycol (MIRALAX / GLYCOLAX) packet 17 g  17 g Oral Daily PRN [name], NP   17 g at 04/13/23 1734   sertraline (ZOLOFT) tablet 150 mg  150 mg Oral Daily Massengill, Nathan, MD   150 mg at 05/12/23 2536   SUMAtriptan (IMITREX) tablet 25 mg  25 mg Oral BID PRN [name], NP   25 mg at 05/07/23 2112   traZODone (DESYREL) tablet 50 mg  50 mg Oral QHS [name], NP   50 mg at 05/11/23 2105   Vitamin D (Ergocalciferol) (DRISDOL) 1.25 MG (50000 UNIT) capsule 50,000 Units  50,000 Units Oral Q7 days [name], NP   50,000 Units at 05/11/23 1431   Lab Results:  No results found for this or any previous visit (from the past 48 hour(s)).       Blood Alcohol level:  Lab Results  Component Value Date   ETH <10 12/19/2022   ETH <10 11/21/2019   Metabolic Disorder Labs: Lab Results  Component Value Date   HGBA1C 4.4 (L) 12/21/2022   MPG 79.58 12/21/2022   No results found for: "PROLACTIN" Lab Results  Component Value Date   CHOL 218 (H) 12/21/2022   TRIG 81 12/21/2022   HDL 53 12/21/2022   CHOLHDL 4.1 12/21/2022   VLDL 16 12/21/2022   LDLCALC 149 (H) 12/21/2022   LDLCALC 110 (H) 03/13/2011   Physical Findings: AIMS: Facial and Oral Movements Muscles of Facial Expression: None Lips and Perioral Area: None Jaw: None Tongue: None,Extremity Movements Upper (arms, wrists, hands, fingers): None Lower (legs, knees, ankles, toes): None, Trunk Movements Neck, shoulders, hips: None, Global Judgements Severity of abnormal movements overall :  None Incapacitation due to abnormal movements: None Patient's awareness of abnormal movements: No Awareness, Dental Status Current problems with teeth and/or dentures?: No Does patient usually wear dentures?: No  CIWA:    COWS:    AIMS:0 Musculoskeletal: Strength & Muscle Tone: within normal limits Gait & Station: normal Patient leans: N/A  Psychiatric Specialty Exam:  Presentation  General Appearance:  Casual; Disheveled  Eye Contact: Limited to baseline Speech: Decreased amount, decreased tone and volume but at baseline Speech Volume: Decreased  Handedness: Right  Mood and Affect  Mood: Euthymic, pleasant, calm Affect: Blunt; Restricted At baseline Thought Process  Thought Processes: Linear  Descriptions of Associations:Intact  Orientation:Full (Time, Place and Person)  Thought Content:Rumination Concrete History of Schizophrenia/Schizoaffective disorder:Yes  Duration of Psychotic Symptoms:Greater than six months  Hallucinations:No data recorded     Ideas of Reference: None  Suicidal Thoughts:No data recorded     Homicidal Thoughts:No data recorded    Sensorium  Memory: Immediate Fair; Recent Poor; Remote Fair  Judgment: Fair  Insight: Fair  Chartered certified accountant: Fair  Attention Span: Fair  Recall: [employer] of Knowledge: Fair  Language: Fair  Psychomotor Activity  Psychomotor Activity: No data recorded     Assets  Assets: Communication Skills; Desire for Improvement  Sleep  Sleep: No data recorded     Physical Exam: Physical Exam Vitals and nursing note reviewed.  Cardiovascular:     Rate and Rhythm: Normal rate.  Pulmonary:     Effort: Pulmonary effort is normal.  Genitourinary:    Comments: Deferred Musculoskeletal:        General: Normal range of motion.     Cervical back: Normal range of motion.  Skin:    General: Skin is warm and dry.  Neurological:  General: No focal  deficit present.     Mental Status: She is alert and oriented to person, place, and time.  Psychiatric:        Mood and Affect: Mood normal.        Behavior: Behavior normal.        Thought Content: Thought content normal.    Review of Systems  Constitutional:  Negative for fever.  Respiratory:  Negative for cough, shortness of breath and wheezing.   Gastrointestinal:  Negative for abdominal pain, constipation and vomiting.  Genitourinary:        Pain to light touch on the R side  Skin:  Negative for rash.  Neurological:  Negative for dizziness.  Psychiatric/Behavioral:  Negative for depression, hallucinations, memory loss, substance abuse and suicidal ideas. The patient is not nervous/anxious and does not have insomnia.   All other systems reviewed and are negative.  Blood pressure (!) 88/55, pulse 72, temperature 98.3 F (36.8 C), temperature source Oral, resp. rate 16, height 4\' 11"  (1.499 m), weight 63 kg, SpO2 96%. Body mass index is 28.05 kg/m.  Treatment Plan Summary: Daily contact with patient to assess and evaluate symptoms and progress in treatment and Medication management.   Will continue to monitor side pain, patient is reporting patient was sensitive to light touch on the area. At this time it may be 2/2 to patient constantly lying on that side. Patient not having urinary concerns currently.    Still waiting on safe disposition as patient would not be able to live independently given cognitive impairment. Recommend assisted living.     No medication side effects reported.  Because of urinary incontinence, recommended adult diapers to be worn.   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  Plan:  -Continue Imitrex 25 mg PRN BID for  migraines -Continue Colace 100 mg daily for constipation -Continue Vitamin D 50.000 units weekly for bone health. -Continue Sertraline150 mg po Q daily for depression/anxiety -Continue Buspar 15 mg po bid for anxiety.  -Continue paliperidone 6 mg po qd for mood stabilization -Continue Trazodone 50 mg nightly for insomnia  -Continue Nicoderm 21 mg topically Q 24 hrs for nicotine withdrawal management. -Continue vitamin B12 1000 mcg IM weekly for 4 weeks then monthly -Continue Melatonin 5 mg nightly for sleep -Continue Protonix EC 40 mg p.o. daily for GERD -Continue MiraLAX 17 g p.o. PRN for constipation -Continue hydroxyzine 25 mg p.o. 3 times daily as needed for anxiety -Continue Ensure nutritional shakes TID in between meals  -Previously discontinued Hydroxyzine 50 mg nightly-hypotension in the mornings   10/31 CBC within normal level, CMP within normal level  Safety and Monitoring: Voluntary admission to inpatient psychiatric unit for safety, stabilization and treatment Daily contact with patient to assess and evaluate symptoms and progress in treatment Patient's case to be discussed in multi-disciplinary team meeting Observation Level : q15 minute checks Vital signs: q12 hours Precautions: Safety   Discharge Planning: Social work and case management to assist with discharge planning and identification of hospital follow-up needs prior to discharge Estimated LOS: Unknown at this time. Discharge Concerns: Need to establish a safety plan; Medication compliance and effectiveness Discharge Goals: Return home with outpatient referrals for mental health follow-up including medication management/psychotherapy No legal guardian, has payee Total Time Spent in Direct Patient Care:   I personally spent 25 minutes on the  unit in direct patient care. The direct patient care time included face-to-face time with the patient, reviewing the patient's chart, communicating with other professionals,  and coordinating care. Greater than 50% of this time was spent in counseling or coordinating care with the patient regarding goals of hospitalization, psycho-education, and discharge planning needs.   Shantae Vantol [name], MD,  05/12/2023, 9:47 AM

## 2023-05-12 NOTE — BHH Group Notes (Signed)
Type of Therapy and Topic:  Group Therapy: Self Care  Participation Level:  Did Not Attend   Description of Group:   In this group, patients shared and discussed the importance of acknowledging the elements in their lives for which they are taking care of their self mentally and how this can positively impact their mood.  The group discussed how bringing the positive elements of their lives to the forefront of their minds can help with recovery from any illness, physical or mental.  An exercise was done as a group in which a list was made for self care items in order to encourage participants to consider other potential positives in their lives.  Therapeutic Goals: Patients will identify one or more item for which they are taking care of themself mentally  Patients will discuss how it is possible to seek self care in even bad situations. Patients will explore other possible items of self care that they could remember.   Summary of Patient Progress:  NA  Therapeutic Modalities:   Cognitive Behavioral Therapy MI

## 2023-05-12 NOTE — Plan of Care (Signed)
  Problem: Activity: Goal: Interest or engagement in leisure activities will improve Outcome: Progressing   Problem: Coping: Goal: Will verbalize feelings Outcome: Progressing   Problem: Medication: Goal: Compliance with prescribed medication regimen will improve Outcome: Progressing

## 2023-05-13 ENCOUNTER — Encounter (HOSPITAL_COMMUNITY): Payer: Self-pay

## 2023-05-13 DIAGNOSIS — F251 Schizoaffective disorder, depressive type: Secondary | ICD-10-CM | POA: Diagnosis not present

## 2023-05-13 NOTE — Plan of Care (Signed)
  Problem: Coping: Goal: Will verbalize feelings Outcome: Progressing   Problem: Medication: Goal: Compliance with prescribed medication regimen will improve Outcome: Progressing   Problem: Safety: Goal: Periods of time without injury will increase Outcome: Progressing

## 2023-05-13 NOTE — Progress Notes (Signed)
   05/13/23 0600  15 Minute Checks  Location Bedroom  Visual Appearance Calm  Behavior Sleeping  Sleep (Behavioral Health Patients Only)  Calculate sleep? (Click Yes once per 24 hr at 0600 safety check) Yes  Documented sleep last 24 hours 9.25

## 2023-05-13 NOTE — Group Note (Unsigned)
Date:  05/13/2023 Time:  11:22 AM  Group Topic/Focus:  Goals Group:   The focus of this group is to help patients establish daily goals to achieve during treatment and discuss how the patient can incorporate goal setting into their daily lives to aide in recovery.     Participation Level:  {BHH PARTICIPATION ZOXWR:60454}  Participation Quality:  {BHH PARTICIPATION QUALITY:22265}  Affect:  {BHH AFFECT:22266}  Cognitive:  {BHH COGNITIVE:22267}  Insight: {BHH Insight2:20797}  Engagement in Group:  {BHH ENGAGEMENT IN UJWJX:91478}  Modes of Intervention:  {BHH MODES OF INTERVENTION:22269}  Additional Comments:  ***  [name] 05/13/2023, 11:22 AM

## 2023-05-13 NOTE — Group Note (Signed)
Recreation Therapy Group Note   Group Topic:Team Building  Group Date: 05/13/2023 Start Time: 0943 End Time: 1018 Facilitators: Argentina Kosch-McCall, LRT,CTRS Location: [address] Dayroom   Group Topic: Communication, Team Building, Problem Solving  Goal Area(s) Addresses:  Patient will effectively work with peer towards shared goal.  Patient will identify skills used to make activity successful.  Patient will identify how skills used during activity can be applied to reach post d/c goals.   Intervention: STEM Activity- Glass blower/designer  Group Description: Tallest Pharmacist, community. In teams of 5-6, patients were given 11 craft pipe cleaners. Using the materials provided, patients were instructed to compete again the opposing team(s) to build the tallest free-standing structure from floor level. The activity was timed; difficulty increased by Clinical research associate as Production designer, theatre/television/film continued.  Systematically resources were removed with additional directions for example, placing one arm behind their back, working in silence, and shape stipulations. LRT facilitated post-activity discussion reviewing team processes and necessary communication skills involved in completion. Patients were encouraged to reflect how the skills utilized, or not utilized, in this activity can be incorporated to positively impact support systems post discharge.  Education: Pharmacist, community, Scientist, physiological, Discharge Planning   Education Outcome: Acknowledges education/In group clarification offered/Needs additional education.   Affect/Mood: N/A   Participation Level: Did not attend    Clinical Observations/Individualized Feedback:     Plan: Continue to engage patient in RT group sessions 2-3x/week.   Yolanda [name], LRT,CTRS 05/13/2023 12:38 PM

## 2023-05-13 NOTE — Progress Notes (Signed)
[facility] MD Progress Note  05/13/2023 10:07 AM Yolanda [name]  MRN:  [id number]  Principal Problem: Schizoaffective disorder, depressive type (HCC)  Diagnosis: Principal Problem:   Schizoaffective disorder, depressive type (HCC) Active Problems:   GERD (gastroesophageal reflux disease)   Intellectual disability   Tobacco use disorder  Reason for admission: This is the first psychiatric admission in this [facility] in 12 years for this 9 AA female with an extensive hx of mental illnesses & probable polysubstance use disorders. She is admitted to the [facility] from the [facility] hospital with complain of worsening suicidal ideations with plan to stab herself. Per chart review, patient apparently reported at the ED that she has been depressed for a while & has not been taking her mental health medications. After medical evaluation.clearance, she was transferred to the [facility] for further psychiatric evaluation/treatments.   Patient assessment note: Patient continues to comply with medications on the unit, denies side effects with them.  Upon evaluation today she continues to report stable mood and continues to deny passive or active SI intention or plan, denies HI, denies auditory or visual hallucinations when asked and does not appear responding to stimuli no paranoia or other delusions noted.  She reports fair sleep and appetite.  She does attend some group and interacted in the milieu during daytime.  Patient reports she spoke with her daughter who plans to bring patient's grandson for a visit on Tuesday, will follow.  Per chart review no as needed medication needed or given for agitation or aggression, as needed Atarax for anxiety using average once daily with last use 10/31  Still awaiting placement  Total Time spent with patient:  25 minutes  Past Psychiatric History:  See H & P  Past Medical History:  Past Medical History:  Diagnosis Date   Bipolar affect, depressed (HCC)    Constipation 08/17/2022    Depression    Falls 07/21/2022   Fracture of femoral neck, right, closed (HCC) 01/17/2022   Herpes simplex 08/22/2017   Open fracture dislocation of right elbow joint 01/17/2022    Past Surgical History:  Procedure Laterality Date   NO PAST SURGERIES     SALPINGECTOMY     Family History: History reviewed. No pertinent family history.  Family Psychiatric  History: See H & P  Social History:  Social History   Substance and Sexual Activity  Alcohol Use Yes     Social History   Substance and Sexual Activity  Drug Use Yes   Types: Cocaine, Marijuana    Social History   Socioeconomic History   Marital status: Single    Spouse name: Not on file   Number of children: Not on file   Years of education: Not on file   Highest education level: Not on file  Occupational History   Not on file  Tobacco Use   Smoking status: Every Day   Smokeless tobacco: Not on file  Substance and Sexual Activity   Alcohol use: Yes   Drug use: Yes    Types: Cocaine, Marijuana   Sexual activity: Yes  Other Topics Concern   Not on file  Social History Narrative   Not on file   Social Determinants of Health   Financial Resource Strain: Low Risk  (09/12/2022)   Received from [facility], [facility]   Overall Financial Resource Strain (CARDIA)    Difficulty of Paying Living Expenses: Not hard at all  Food Insecurity: Patient Declined (12/20/2022)   Hunger Vital Sign  Worried About Programme researcher, broadcasting/film/video in the Last Year: Patient declined    Barista in the Last Year: Patient declined  Transportation Needs: No Transportation Needs (12/20/2022)   PRAPARE - Administrator, Civil Service (Medical): No    Lack of Transportation (Non-Medical): No  Physical Activity: Not on file  Stress: No Stress Concern Present (07/17/2022)   Received from [facility], [facility] of Occupational Health - Occupational Stress Questionnaire    Feeling of Stress : Not  at all  Social Connections: Unknown (07/16/2022)   Received from [facility], [facility]   Social Network    Social Network: Not on file   Sleep: Good  Appetite:  Good  Current Medications: Current Facility-Administered Medications  Medication Dose Route Frequency Provider Last Rate Last Admin   acetaminophen (TYLENOL) tablet 650 mg  650 mg Oral Q6H PRN [name], NP   650 mg at 05/12/23 1631   alum & mag hydroxide-simeth (MAALOX/MYLANTA) 200-200-20 MG/5ML suspension 30 mL  30 mL Oral Q4H PRN [name], NP   30 mL at 01/28/23 1530   busPIRone (BUSPAR) tablet 15 mg  15 mg Oral BID [name], NP   15 mg at 05/13/23 0916   cyanocobalamin (VITAMIN B12) injection 1,000 mcg  1,000 mcg Intramuscular Q30 days Sarita Bottom, MD   1,000 mcg at 05/03/23 1257   diphenhydrAMINE (BENADRYL) capsule 50 mg  50 mg Oral TID PRN [name], NP   50 mg at 01/15/23 1211   Or   diphenhydrAMINE (BENADRYL) injection 50 mg  50 mg Intramuscular TID PRN [name], NP       docusate sodium (COLACE) capsule 100 mg  100 mg Oral Daily Nkwenti, Tyler Aas, NP   100 mg at 05/13/23 0916   feeding supplement (ENSURE ENLIVE / ENSURE PLUS) liquid 237 mL  237 mL Oral TID BM [name], NP   237 mL at 05/12/23 2133   haloperidol (HALDOL) tablet 5 mg  5 mg Oral TID PRN [name], NP   5 mg at 05/04/23 1837   Or   haloperidol lactate (HALDOL) injection 5 mg  5 mg Intramuscular TID PRN [name], NP       hydrOXYzine (ATARAX) tablet 25 mg  25 mg Oral TID PRN [name], DO   25 mg at 05/09/23 1834   LORazepam (ATIVAN) tablet 2 mg  2 mg Oral TID PRN [name], NP   2 mg at 01/15/23 1211   Or   LORazepam (ATIVAN) injection 2 mg  2 mg Intramuscular TID PRN [name], NP       magnesium hydroxide (MILK OF MAGNESIA) suspension 30 mL  30 mL Oral Daily PRN [name], NP   30 mL at 04/13/23 0814   melatonin tablet 5 mg  5 mg Oral QHS Nkwenti, Doris, NP   5 mg at 05/12/23 2133   nicotine  (NICODERM CQ - dosed in mg/24 hours) patch 21 mg  21 mg Transdermal Daily [name], DO   21 mg at 05/13/23 1610   nicotine polacrilex (NICORETTE) gum 2 mg  2 mg Oral PRN [name], MD   2 mg at 05/12/23 1630   paliperidone (INVEGA) 24 hr tablet 6 mg  6 mg Oral Daily [name], NP   6 mg at 05/12/23 2132   pantoprazole (PROTONIX) EC tablet 40 mg  40 mg Oral Daily Sanjuana Kava, NP  40 mg at 05/13/23 0916   polyethylene glycol (MIRALAX / GLYCOLAX) packet 17 g  17 g Oral Daily PRN [name], NP   17 g at 04/13/23 1734   sertraline (ZOLOFT) tablet 150 mg  150 mg Oral Daily Massengill, Nathan, MD   150 mg at 05/13/23 0916   SUMAtriptan (IMITREX) tablet 25 mg  25 mg Oral BID PRN [name], NP   25 mg at 05/07/23 2112   traZODone (DESYREL) tablet 50 mg  50 mg Oral QHS [name], NP   50 mg at 05/12/23 2133   Vitamin D (Ergocalciferol) (DRISDOL) 1.25 MG (50000 UNIT) capsule 50,000 Units  50,000 Units Oral Q7 days [name], NP   50,000 Units at 05/11/23 1431   Lab Results:  No results found for this or any previous visit (from the past 48 hour(s)).       Blood Alcohol level:  Lab Results  Component Value Date   ETH <10 12/19/2022   ETH <10 11/21/2019   Metabolic Disorder Labs: Lab Results  Component Value Date   HGBA1C 4.4 (L) 12/21/2022   MPG 79.58 12/21/2022   No results found for: "PROLACTIN" Lab Results  Component Value Date   CHOL 218 (H) 12/21/2022   TRIG 81 12/21/2022   HDL 53 12/21/2022   CHOLHDL 4.1 12/21/2022   VLDL 16 12/21/2022   LDLCALC 149 (H) 12/21/2022   LDLCALC 110 (H) 03/13/2011   Physical Findings: AIMS: Facial and Oral Movements Muscles of Facial Expression: None Lips and Perioral Area: None Jaw: None Tongue: None,Extremity Movements Upper (arms, wrists, hands, fingers): None Lower (legs, knees, ankles, toes): None, Trunk Movements Neck, shoulders, hips: None, Global Judgements Severity of abnormal movements overall  : None Incapacitation due to abnormal movements: None Patient's awareness of abnormal movements: No Awareness, Dental Status Current problems with teeth and/or dentures?: No Does patient usually wear dentures?: No  CIWA:    COWS:    AIMS:0 Musculoskeletal: Strength & Muscle Tone: within normal limits Gait & Station: normal Patient leans: N/A  Psychiatric Specialty Exam:  Presentation  General Appearance:  Casual; Disheveled  Eye Contact: Limited to baseline Speech: Decreased amount, decreased tone and volume but at baseline Speech Volume: Decreased  Handedness: Right  Mood and Affect  Mood: Euthymic, pleasant, calm Affect: Blunt; Restricted At baseline Thought Process  Thought Processes: Linear  Descriptions of Associations:Intact  Orientation:Full (Time, Place and Person)  Thought Content:Rumination Concrete History of Schizophrenia/Schizoaffective disorder:Yes  Duration of Psychotic Symptoms:Greater than six months  Hallucinations:No data recorded     Ideas of Reference: None  Suicidal Thoughts:No data recorded     Homicidal Thoughts:No data recorded    Sensorium  Memory: Immediate Fair; Recent Poor; Remote Fair  Judgment: Fair  Insight: Fair  Chartered certified accountant: Fair  Attention Span: Fair  Recall: [employer] of Knowledge: Fair  Language: Fair  Psychomotor Activity  Psychomotor Activity: No data recorded     Assets  Assets: Communication Skills; Desire for Improvement  Sleep  Sleep: No data recorded     Physical Exam: Physical Exam Vitals and nursing note reviewed.  Cardiovascular:     Rate and Rhythm: Normal rate.  Pulmonary:     Effort: Pulmonary effort is normal.  Genitourinary:    Comments: Deferred Musculoskeletal:        General: Normal range of motion.     Cervical back: Normal range of motion.  Skin:    General: Skin is warm and dry.  Neurological:  General: No  focal deficit present.     Mental Status: She is alert and oriented to person, place, and time.  Psychiatric:        Mood and Affect: Mood normal.        Behavior: Behavior normal.        Thought Content: Thought content normal.    Review of Systems  Constitutional:  Negative for fever.  Respiratory:  Negative for cough, shortness of breath and wheezing.   Gastrointestinal:  Negative for abdominal pain, constipation and vomiting.  Genitourinary:        Pain to light touch on the R side  Skin:  Negative for rash.  Neurological:  Negative for dizziness.  Psychiatric/Behavioral:  Negative for depression, hallucinations, memory loss, substance abuse and suicidal ideas. The patient is not nervous/anxious and does not have insomnia.   All other systems reviewed and are negative.  Blood pressure 94/66, pulse 99, temperature 97.6 F (36.4 C), temperature source Oral, resp. rate 16, height 4\' 11"  (1.499 m), weight 63 kg, SpO2 99%. Body mass index is 28.05 kg/m.  Treatment Plan Summary: Daily contact with patient to assess and evaluate symptoms and progress in treatment and Medication management.   Will continue to monitor side pain, patient is reporting patient was sensitive to light touch on the area. At this time it may be 2/2 to patient constantly lying on that side. Patient not having urinary concerns currently.    Still waiting on safe disposition as patient would not be able to live independently given cognitive impairment. Recommend assisted living.     No medication side effects reported.  Because of urinary incontinence, recommended adult diapers to be worn.   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  Plan:  -Continue Imitrex 25 mg PRN BID for  migraines -Continue Colace 100 mg daily for constipation -Continue Vitamin D 50.000 units weekly for bone health. -Continue Sertraline150 mg po Q daily for depression/anxiety -Continue Buspar 15 mg po bid for anxiety.  -Continue paliperidone 6 mg po qd for mood stabilization -Continue Trazodone 50 mg nightly for insomnia  -Continue Nicoderm 21 mg topically Q 24 hrs for nicotine withdrawal management. -Continue vitamin B12 1000 mcg IM weekly for 4 weeks then monthly -Continue Melatonin 5 mg nightly for sleep -Continue Protonix EC 40 mg p.o. daily for GERD -Continue MiraLAX 17 g p.o. PRN for constipation -Continue hydroxyzine 25 mg p.o. 3 times daily as needed for anxiety -Continue Ensure nutritional shakes TID in between meals  -Previously discontinued Hydroxyzine 50 mg nightly-hypotension in the mornings   10/31 CBC within normal level, CMP within normal level  Safety and Monitoring: Voluntary admission to inpatient psychiatric unit for safety, stabilization and treatment Daily contact with patient to assess and evaluate symptoms and progress in treatment Patient's case to be discussed in multi-disciplinary team meeting Observation Level : q15 minute checks Vital signs: q12 hours Precautions: Safety   Discharge Planning: Social work and case management to assist with discharge planning and identification of hospital follow-up needs prior to discharge Estimated LOS: Unknown at this time. Discharge Concerns: Need to establish a safety plan; Medication compliance and effectiveness Discharge Goals: Return home with outpatient referrals for mental health follow-up including medication management/psychotherapy No legal guardian, has payee Total Time Spent in Direct Patient Care:   I personally spent 25 minutes on the unit  in direct patient care. The direct patient care time included face-to-face time with the patient, reviewing the patient's chart, communicating with other professionals,  and coordinating care. Greater than 50% of this time was spent in counseling or coordinating care with the patient regarding goals of hospitalization, psycho-education, and discharge planning needs.   Naithen Rivenburg [name], MD,  05/13/2023, 10:07 AM

## 2023-05-13 NOTE — Progress Notes (Signed)
I assumed care for [name] at about 08:00.  She was resting in her room, awake, at baseline, poor hygiene, vital signs WNL, denied any new pain, has been med compliant, denied any avh/hi/si, attended to her adls. She is being monitored as ordered. She is lookinng forward to seeing her grandson tomorrow.

## 2023-05-13 NOTE — Progress Notes (Signed)
   05/12/23 2130  Psych Admission Type (Psych Patients Only)  Admission Status Involuntary  Psychosocial Assessment  Patient Complaints Sadness  Eye Contact Fair  Facial Expression Flat  Affect Sad  Speech Logical/coherent;Soft  Interaction Childlike;Assertive  Motor Activity Slow;Tremors  Appearance/Hygiene Disheveled;Poor Media planner Cooperative  Mood Sad;Pleasant  Thought Process  Coherency WDL  Content WDL  Delusions None reported or observed  Perception WDL  Hallucination None reported or observed  Judgment Impaired  Confusion None  Danger to Self  Current suicidal ideation? Denies  Self-Injurious Behavior No self-injurious ideation or behavior indicators observed or expressed   Agreement Not to Harm Self Yes  Description of Agreement verbal  Danger to Others  Danger to Others None reported or observed

## 2023-05-13 NOTE — Group Note (Unsigned)
Date:  05/14/2023 Time:  2:23 AM  Group Topic/Focus:  Wrap-Up Group:   The focus of this group is to help patients review their daily goal of treatment and discuss progress on daily workbooks.    Participation Level:  Minimal  Participation Quality:  Appropriate and Sharing  Affect:  Appropriate  Cognitive:  Appropriate  Insight: Appropriate and Limited  Engagement in Group:  Engaged and Limited  Modes of Intervention:  Activity and Socialization  Additional Comments:  The patient rated her day a 4/10. The patient stated that she is grieving the lost of her mother. The patient did not share much during the group. There was some participation in the group activity at the end of the group.   Kennieth Francois 05/14/2023, 2:23 AM

## 2023-05-13 NOTE — Plan of Care (Signed)
  Problem: Education: Goal: Utilization of techniques to improve thought processes will improve Outcome: Progressing Goal: Knowledge of the prescribed therapeutic regimen will improve Outcome: Progressing   

## 2023-05-13 NOTE — Progress Notes (Signed)
D) Pt received calm, visible, participating in milieu, and in no acute distress. Pt A & O x4. Pt denies SI, HI, A/ V H, depression, anxiety and pain at this time. A) Pt encouraged to drink fluids. Pt encouraged to come to staff with needs. Pt encouraged to attend and participate in groups. Pt encouraged to set reachable goals.  R) Pt remained safe on unit, in no acute distress, will continue to assess.     05/13/23 2200  Psych Admission Type (Psych Patients Only)  Admission Status Involuntary  Psychosocial Assessment  Patient Complaints Anxiety  Eye Contact Fair  Facial Expression Flat  Affect Appropriate to circumstance  Speech Logical/coherent  Interaction Childlike;Assertive  Motor Activity Slow  Appearance/Hygiene Disheveled  Behavior Characteristics Cooperative  Mood Anxious  Thought Process  Coherency WDL  Content WDL  Delusions None reported or observed  Perception WDL  Hallucination None reported or observed  Judgment Impaired  Confusion None  Danger to Self  Current suicidal ideation? Denies  Self-Injurious Behavior No self-injurious ideation or behavior indicators observed or expressed   Agreement Not to Harm Self Yes  Description of Agreement verbal  Danger to Others  Danger to Others None reported or observed

## 2023-05-13 NOTE — B[facility] Group Notes (Signed)
Adult Psychoeducational Group Note  Date:  05/13/2023 Time:  9:48 PM  Group Topic/Focus:  Wrap-Up Group:   The focus of this group is to help patients review their daily goal of treatment and discuss progress on daily workbooks.  Participation Level:  Did Not Attend  Christ Kick 05/13/2023, 9:48 PM

## 2023-05-14 DIAGNOSIS — F251 Schizoaffective disorder, depressive type: Secondary | ICD-10-CM | POA: Diagnosis not present

## 2023-05-14 NOTE — Group Note (Signed)
LCSW Group Therapy Note  Group Date: 05/14/2023 Start Time: 1100 End Time: 1200   Type of Therapy and Topic:  Group Therapy - Healthy vs Unhealthy Coping Skills  Participation Level:  Active   Description of Group The focus of this group was to determine what unhealthy coping techniques typically are used by group members and what healthy coping techniques would be helpful in coping with various problems. Patients were guided in becoming aware of the differences between healthy and unhealthy coping techniques. Patients were asked to identify 2-3 healthy coping skills they would like to learn to use more effectively.  Therapeutic Goals Patients learned that coping is what human beings do all day long to deal with various situations in their lives Patients defined and discussed healthy vs unhealthy coping techniques Patients identified their preferred coping techniques and identified whether these were healthy or unhealthy Patients determined 2-3 healthy coping skills they would like to become more familiar with and use more often. Patients provided support and ideas to each other   Summary of Patient Progress:  During group, patient proved open to input from peers and feedback from CSW. Patient demonstrated positive insight into the subject matter, was respectful of peers, and participated throughout the entire session. Pt was in group for the first 30 minutes until called into a meeting. During time pt was present, pt participated and provided insight to conversation.    Therapeutic Modalities Cognitive Behavioral Therapy Motivational Interviewing  Kathi Der, LCSWA 05/14/2023  1:31 PM

## 2023-05-14 NOTE — Plan of Care (Signed)
  Problem: Activity: Goal: Interest or engagement in leisure activities will improve Outcome: Progressing Goal: Imbalance in normal sleep/wake cycle will improve Outcome: Progressing   Problem: Coping: Goal: Coping ability will improve Outcome: Progressing Goal: Will verbalize feelings Outcome: Progressing   Problem: Coping: Goal: Coping ability will improve Outcome: Progressing   Problem: Medication: Goal: Compliance with prescribed medication regimen will improve Outcome: Progressing   Problem: Safety: Goal: Periods of time without injury will increase Outcome: Progressing

## 2023-05-14 NOTE — Progress Notes (Signed)
[facility] LCSW Progress Note   05/14/2023    3:24 PM  Yolanda [name]      Type of Note: Placement meeting    CSW had a meeting with partners care manager ( Corey Skains ), Tailored care manager Belva Chimes ) , APS worker /Ex parte Guardian ( Thamas Jaegers ), [facility] supervisor Care manager Rosine Beat ) and patient. However, it was shared from partners that the 1915(i) request was approved on Halloween , but now they have to change patient Medicaid from B to C. At this time placement has been found and confirmed that they will accept patient, per partners; The AFL is called [facility] in [place] and the AFL director is  Tedra Coupe ; Meanwhile, the reason why patient has not yet transitions is because patient does have the current medicaid and now, the next step is to see how [employer] can bill patient Medicaid B until it is changed to Medicaid C. Meanwhile, partners is backing away since they have done most of the work and basically did a handoff to [place] with providing her with everyone contact numbers with who to follow up with. Partners stated that this would be the last meeting with them since [facility] is the new providers; Trula Ore continued on and shared that patient would receive a new care manager name Zara Council, who was out of office today and will return back on Monday to proceed with case. Trula Ore stated that Marlynn Perking would need to do a couple of assessments with patient to she other alternative resources along with following up with the process to get her to [employer]. Patient became tearful and stated that she is just ready to go. Call ended and CSW spoke with patient and encouraged her that she will be out soon, to be patience since she already has placement , just need to figure out the payment with the AFL, patient said " ok" but was still tearful. CSW will continue to assist.     Signed:   Jacob Moores, MSW, [facility] 05/14/2023  3:24 PM

## 2023-05-14 NOTE — Group Note (Signed)
Recreation Therapy Group Note   Group Topic:Animal Assisted Therapy   Group Date: 05/14/2023 Start Time: 4098 End Time: 1030 Facilitators: Dupree Givler-McCall, LRT,CTRS Location: [address] Dayroom   Animal-Assisted Activity (AAA) Program Checklist/Progress Notes Patient Eligibility Criteria Checklist & Daily Group note for Rec Tx Intervention  AAA/T Program Assumption of Risk Form signed by Patient/ or Parent Legal Guardian Yes  Patient understands his/her participation is voluntary Yes  Education: Charity fundraiser, Appropriate Animal Interaction   Education Outcome: Acknowledges education.    Affect/Mood: N/A   Participation Level: Did not attend    Clinical Observations/Individualized Feedback:     Plan: Continue to engage patient in RT group sessions 2-3x/week.   Callen Vancuren-McCall, LRT,CTRS 05/14/2023 12:31 PM

## 2023-05-14 NOTE — Group Note (Unsigned)
Date:  05/15/2023 Time:  12:58 AM  Group Topic/Focus:  Wrap-Up Group:   The focus of this group is to help patients review their daily goal of treatment and discuss progress on daily workbooks.    Participation Level:  Active  Participation Quality:  Appropriate and Sharing  Affect:  Appropriate  Cognitive:  Appropriate  Insight: Appropriate and Limited  Engagement in Group:  Engaged  Modes of Intervention:  Activity and Socialization  Additional Comments:  The patient rated her day a "3 out of 10". Patient stated that her day was "okay". Patient stated that she was not as "active" today. Patient participated in the ice breaker/riddle activity after sharing.   Kennieth Francois 05/15/2023, 12:58 AM

## 2023-05-14 NOTE — Progress Notes (Signed)
   05/14/23 0818  Psych Admission Type (Psych Patients Only)  Admission Status Involuntary  Psychosocial Assessment  Patient Complaints Anxiety  Eye Contact Fair  Facial Expression Flat;Anxious  Affect Appropriate to circumstance  Speech Logical/coherent  Interaction Childlike;Assertive  Motor Activity Slow  Appearance/Hygiene Unremarkable  Behavior Characteristics Cooperative  Mood Anxious  Thought Process  Coherency WDL  Content WDL  Delusions None reported or observed  Perception WDL  Hallucination None reported or observed  Judgment Impaired  Confusion None  Danger to Self  Current suicidal ideation? Denies  Self-Injurious Behavior No self-injurious ideation or behavior indicators observed or expressed   Agreement Not to Harm Self Yes  Description of Agreement Verbal  Danger to Others  Danger to Others None reported or observed

## 2023-05-14 NOTE — Progress Notes (Signed)
[facility] MD Progress Note  05/14/2023 3:05 PM Yolanda [name]  MRN:  [id number] Subjective:  Yolanda [name] was seen today in her room on rounds. She was reading the bible. She brightens up when asked about a coming visit with her grandson. She is sad when talking about hearing the voice of "my spirit" and tells her negative, religiously themed things. We discuss  this for a little while and she accepts reassurance. She has no active suicidal ideation. Future orientation is fair.   Principal Problem: Schizoaffective disorder, depressive type (HCC) Diagnosis: Principal Problem:   Schizoaffective disorder, depressive type (HCC) Active Problems:   GERD (gastroesophageal reflux disease)   Intellectual disability   Tobacco use disorder  Total Time spent with patient: 20 minutes  Past Psychiatric History: see H&P  Past Medical History:  Past Medical History:  Diagnosis Date   Bipolar affect, depressed (HCC)    Constipation 08/17/2022   Depression    Falls 07/21/2022   Fracture of femoral neck, right, closed (HCC) 01/17/2022   Herpes simplex 08/22/2017   Open fracture dislocation of right elbow joint 01/17/2022    Past Surgical History:  Procedure Laterality Date   NO PAST SURGERIES     SALPINGECTOMY     Family History: History reviewed. No pertinent family history. Family Psychiatric  History: see H&P Social History:  Social History   Substance and Sexual Activity  Alcohol Use Yes     Social History   Substance and Sexual Activity  Drug Use Yes   Types: Cocaine, Marijuana    Social History   Socioeconomic History   Marital status: Single    Spouse name: Not on file   Number of children: Not on file   Years of education: Not on file   Highest education level: Not on file  Occupational History   Not on file  Tobacco Use   Smoking status: Every Day   Smokeless tobacco: Not on file  Substance and Sexual Activity   Alcohol use: Yes   Drug use: Yes    Types: Cocaine,  Marijuana   Sexual activity: Yes  Other Topics Concern   Not on file  Social History Narrative   Not on file   Social Determinants of Health   Financial Resource Strain: Low Risk  (09/12/2022)   Received from [facility], [facility]   Overall Financial Resource Strain (CARDIA)    Difficulty of Paying Living Expenses: Not hard at all  Food Insecurity: Patient Declined (12/20/2022)   Hunger Vital Sign    Worried About Running Out of Food in the Last Year: Patient declined    Ran Out of Food in the Last Year: Patient declined  Transportation Needs: No Transportation Needs (12/20/2022)   PRAPARE - Administrator, Civil Service (Medical): No    Lack of Transportation (Non-Medical): No  Physical Activity: Not on file  Stress: No Stress Concern Present (07/17/2022)   Received from [facility], [facility] of Occupational Health - Occupational Stress Questionnaire    Feeling of Stress : Not at all  Social Connections: Unknown (07/16/2022)   Received from [facility], [facility]   Social Network    Social Network: Not on file   Additional Social History:                         Sleep: Poor  Appetite:  Fair  Current Medications: Current Facility-Administered Medications  Medication Dose Route Frequency  Provider Last Rate Last Admin   acetaminophen (TYLENOL) tablet 650 mg  650 mg Oral Q6H PRN [name], NP   650 mg at 05/12/23 1631   alum & mag hydroxide-simeth (MAALOX/MYLANTA) 200-200-20 MG/5ML suspension 30 mL  30 mL Oral Q4H PRN [name], NP   30 mL at 01/28/23 1530   busPIRone (BUSPAR) tablet 15 mg  15 mg Oral BID [name], NP   15 mg at 05/14/23 5621   cyanocobalamin (VITAMIN B12) injection 1,000 mcg  1,000 mcg Intramuscular Q30 days Sarita Bottom, MD   1,000 mcg at 05/03/23 1257   diphenhydrAMINE (BENADRYL) capsule 50 mg  50 mg Oral TID PRN [name], NP   50 mg at 01/15/23 1211   Or   diphenhydrAMINE  (BENADRYL) injection 50 mg  50 mg Intramuscular TID PRN [name], NP       docusate sodium (COLACE) capsule 100 mg  100 mg Oral Daily [name], NP   100 mg at 05/14/23 0904   feeding supplement (ENSURE ENLIVE / ENSURE PLUS) liquid 237 mL  237 mL Oral TID BM [name], NP   237 mL at 05/14/23 1448   haloperidol (HALDOL) tablet 5 mg  5 mg Oral TID PRN [name], NP   5 mg at 05/04/23 1837   Or   haloperidol lactate (HALDOL) injection 5 mg  5 mg Intramuscular TID PRN [name], NP       hydrOXYzine (ATARAX) tablet 25 mg  25 mg Oral TID PRN [name], DO   25 mg at 05/09/23 1834   LORazepam (ATIVAN) tablet 2 mg  2 mg Oral TID PRN [name], NP   2 mg at 01/15/23 1211   Or   LORazepam (ATIVAN) injection 2 mg  2 mg Intramuscular TID PRN [name], NP       magnesium hydroxide (MILK OF MAGNESIA) suspension 30 mL  30 mL Oral Daily PRN [name], NP   30 mL at 04/13/23 0814   melatonin tablet 5 mg  5 mg Oral QHS Nkwenti, Doris, NP   5 mg at 05/13/23 2111   nicotine (NICODERM CQ - dosed in mg/24 hours) patch 21 mg  21 mg Transdermal Daily [name], DO   21 mg at 05/14/23 3086   nicotine polacrilex (NICORETTE) gum 2 mg  2 mg Oral PRN [name], MD   2 mg at 05/14/23 1304   paliperidone (INVEGA) 24 hr tablet 6 mg  6 mg Oral Daily [name], NP   6 mg at 05/13/23 2111   pantoprazole (PROTONIX) EC tablet 40 mg  40 mg Oral Daily [name], NP   40 mg at 05/14/23 0903   polyethylene glycol (MIRALAX / GLYCOLAX) packet 17 g  17 g Oral Daily PRN [name], NP   17 g at 04/13/23 1734   sertraline (ZOLOFT) tablet 150 mg  150 mg Oral Daily Massengill, Harrold Donath, MD   150 mg at 05/14/23 5784   SUMAtriptan (IMITREX) tablet 25 mg  25 mg Oral BID PRN [name], NP   25 mg at 05/07/23 2112   traZODone (DESYREL) tablet 50 mg  50 mg Oral QHS [name], NP   50 mg at 05/13/23 2110   Vitamin D (Ergocalciferol) (DRISDOL) 1.25 MG (50000 UNIT) capsule  50,000 Units  50,000 Units Oral Q7 days [name], NP   50,000 Units at 05/11/23 1431    Lab Results: No results found for this or any  previous visit (from the past 48 hour(s)).  Blood Alcohol level:  Lab Results  Component Value Date   ETH <10 12/19/2022   ETH <10 11/21/2019    Metabolic Disorder Labs: Lab Results  Component Value Date   HGBA1C 4.4 (L) 12/21/2022   MPG 79.58 12/21/2022   No results found for: "PROLACTIN" Lab Results  Component Value Date   CHOL 218 (H) 12/21/2022   TRIG 81 12/21/2022   HDL 53 12/21/2022   CHOLHDL 4.1 12/21/2022   VLDL 16 12/21/2022   LDLCALC 149 (H) 12/21/2022   LDLCALC 110 (H) 03/13/2011    Physical Findings: AIMS: Facial and Oral Movements Muscles of Facial Expression: None Lips and Perioral Area: None Jaw: None Tongue: None,Extremity Movements Upper (arms, wrists, hands, fingers): None Lower (legs, knees, ankles, toes): None, Trunk Movements Neck, shoulders, hips: None, Global Judgements Severity of abnormal movements overall : None Incapacitation due to abnormal movements: None Patient's awareness of abnormal movements: No Awareness, Dental Status Current problems with teeth and/or dentures?: No Does patient usually wear dentures?: No  CIWA:    COWS:     Musculoskeletal: Strength & Muscle Tone: within normal limits Gait & Station: normal Patient leans: N/A  Psychiatric Specialty Exam:  Presentation  General Appearance:  Appropriate for Environment  Eye Contact: Fair  Speech: Clear and Coherent  Speech Volume: Normal  Handedness: Right   Mood and Affect  Mood: Anxious  Affect: Congruent   Thought Process  Thought Processes: Linear  Descriptions of Associations:Loose  Orientation:Full (Time, Place and Person)  Thought Content:Rumination; Delusions  History of Schizophrenia/Schizoaffective disorder:Yes  Duration of Psychotic Symptoms:Greater than six  months  Hallucinations:Hallucinations: Auditory Description of Auditory Hallucinations: "my spirit, telling me I'm a sinner"  Ideas of Reference:Delusions  Suicidal Thoughts:Suicidal Thoughts: No  Homicidal Thoughts:Homicidal Thoughts: No   Sensorium  Memory: Immediate Fair; Recent Fair  Judgment: Poor  Insight: Poor   Executive Functions  Concentration: Fair  Attention Span: Fair  Recall: Fair  Fund of Knowledge: Poor  Language: Fair   Psychomotor Activity  Psychomotor Activity: Psychomotor Activity: Normal   Assets  Assets: Desire for Improvement   Sleep  Sleep: Sleep: Poor    Physical Exam: Physical Exam HENT:     Head: Normocephalic and atraumatic.     Nose: Nose normal.  Eyes:     Extraocular Movements: Extraocular movements intact.  Pulmonary:     Effort: Pulmonary effort is normal.  Musculoskeletal:     Cervical back: Normal range of motion.  Neurological:     General: No focal deficit present.     Mental Status: She is alert.  Psychiatric:        Attention and Perception: She perceives auditory hallucinations.        Mood and Affect: Mood is anxious.        Thought Content: Thought content is delusional.    Review of Systems  Constitutional:  Negative for fever.  Gastrointestinal:  Negative for constipation, diarrhea, nausea and vomiting.  Genitourinary:  Negative for dysuria.  Musculoskeletal:  Negative for myalgias.  Psychiatric/Behavioral:  Positive for hallucinations.    Blood pressure 96/66, pulse 99, temperature 97.6 F (36.4 C), temperature source Oral, resp. rate 16, height 4\' 11"  (1.499 m), weight 63 kg, SpO2 100%. Body mass index is 28.05 kg/m.   Treatment Plan Summary: Daily contact with patient to assess and evaluate symptoms and progress in treatment and Medication management.    Will continue to monitor side pain, patient is reporting  patient was sensitive to light touch on the area. At this time it may  be 2/2 to patient constantly lying on that side. Patient not having urinary concerns currently.     Still waiting on safe disposition as patient would not be able to live independently given cognitive impairment. Recommend assisted living.     No medication side effects reported.  Because of urinary incontinence, recommended adult diapers to be worn.   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  Plan:  -Continue Imitrex 25 mg PRN BID for migraines -Continue Colace 100 mg daily for constipation -Continue Vitamin D 50.000 units weekly for bone health. -Continue Sertraline150 mg po Q daily for depression/anxiety -Continue Buspar 15 mg po bid for anxiety.  -Continue paliperidone 6 mg po qd for mood stabilization -Continue Trazodone 50 mg nightly for insomnia  -Continue Nicoderm 21 mg topically Q 24 hrs for nicotine withdrawal management. -Continue vitamin B12 1000 mcg IM weekly for 4 weeks then monthly -Continue Melatonin 5 mg nightly for sleep -Continue Protonix EC 40 mg p.o. daily for GERD -Continue MiraLAX 17 g p.o. PRN for constipation -Continue hydroxyzine 25 mg p.o. 3 times daily as needed for anxiety -Continue Ensure nutritional shakes TID in between meals  -Previously discontinued Hydroxyzine 50 mg nightly-hypotension in the mornings   10/31 CBC within normal level, CMP within normal level   Safety and Monitoring: Voluntary admission to inpatient psychiatric unit for safety, stabilization and treatment Daily contact with patient to assess and evaluate symptoms and progress in treatment Patient's case to be discussed in multi-disciplinary team meeting Observation Level : q15 minute checks Vital signs: q12 hours Precautions: Safety    Discharge Planning: Social work and case management to assist with discharge planning and identification of hospital follow-up needs prior to discharge Estimated LOS: Unknown at this time. Discharge Concerns: Need to establish a safety plan; Medication compliance and effectiveness Discharge Goals: Return home with outpatient referrals for mental health follow-up including medication management/psychotherapy No legal guardian, has payee  Roselle Locus, MD 05/14/2023, 3:05 PM

## 2023-05-15 ENCOUNTER — Encounter (HOSPITAL_COMMUNITY): Payer: Self-pay

## 2023-05-15 DIAGNOSIS — F251 Schizoaffective disorder, depressive type: Secondary | ICD-10-CM | POA: Diagnosis not present

## 2023-05-15 NOTE — BHH Group Notes (Signed)
Spiritual care group on grief and loss facilitated by Chaplain Dyanne Carrel, Bcc and Arlyce Dice, Mdiv  Group Goal: Support / Education around grief and loss  Members engage in facilitated group support and psycho-social education.  Group Description:  Following introductions and group rules, group members engaged in facilitated group dialogue and support around topic of loss, with particular support around experiences of loss in their lives. Group Identified types of loss (relationships / self / things) and identified patterns, circumstances, and changes that precipitate losses. Reflected on thoughts / feelings around loss, normalized grief responses, and recognized variety in grief experience. Group encouraged individual reflection on safe space and on the coping skills that they are already utilizing.  Group drew on Adlerian / Rogerian and narrative framework  Patient Progress: Did not attend.

## 2023-05-15 NOTE — Progress Notes (Signed)
[facility] MD Progress Note  05/15/2023 11:47 AM [name]  MRN:  [id number]         Principal Problem: Schizoaffective disorder, depressive type (HCC) Diagnosis: Principal Problem:   Schizoaffective disorder, depressive type (HCC) Active Problems:   GERD (gastroesophageal reflux disease)   Intellectual disability   Tobacco use disorder  Today's assessment note:  Yolanda [name] was seen and examined today in her room lying on her bed.  She is alert, oriented to name, time of day, and situation.  Mood appears less depressed and patient has some smiles to her face. She reports anxiety #2/10, and depression of #3/10, with 10 being high severity. She reports that today in her daughter/grandson who is 64 years old will be visiting with her today.  Further added that have goal is to start preparing for her GED.  GED study book observed in bed with patient.  When asked when she is planning to take the GED, reports, "when I am discharged from the hospital."  Attention to hygiene is fair, and patient wearing clean clothing. She reports sleeping well last night and being restful.  Reports good appetite and drinking enough fluids for hydration.  She denies acute discomfort.  Endorses taking her medications as ordered without any side effect.  Denies SI, HI, or AVH.  We will continue to monitor patient for safety as our plan for discharge is still pending.  Total Time spent with patient: 35 minutes.  Past Psychiatric History: see H&P  Past Medical History:  Past Medical History:  Diagnosis Date   Bipolar affect, depressed (HCC)    Constipation 08/17/2022   Depression    Falls 07/21/2022   Fracture of femoral neck, right, closed (HCC) 01/17/2022   Herpes simplex 08/22/2017   Open fracture dislocation of right elbow joint 01/17/2022    Past Surgical History:  Procedure Laterality Date   NO PAST SURGERIES     SALPINGECTOMY     Family History: History reviewed. No pertinent family history. Family  Psychiatric  History: see H&P Social History:  Social History   Substance and Sexual Activity  Alcohol Use Yes     Social History   Substance and Sexual Activity  Drug Use Yes   Types: Cocaine, Marijuana    Social History   Socioeconomic History   Marital status: Single    Spouse name: Not on file   Number of children: Not on file   Years of education: Not on file   Highest education level: Not on file  Occupational History   Not on file  Tobacco Use   Smoking status: Every Day   Smokeless tobacco: Not on file  Substance and Sexual Activity   Alcohol use: Yes   Drug use: Yes    Types: Cocaine, Marijuana   Sexual activity: Yes  Other Topics Concern   Not on file  Social History Narrative   Not on file   Social Determinants of Health   Financial Resource Strain: Low Risk  (09/12/2022)   Received from [facility], [facility]   Overall Financial Resource Strain (CARDIA)    Difficulty of Paying Living Expenses: Not hard at all  Food Insecurity: Patient Declined (12/20/2022)   Hunger Vital Sign    Worried About Running Out of Food in the Last Year: Patient declined    Ran Out of Food in the Last Year: Patient declined  Transportation Needs: No Transportation Needs (12/20/2022)   PRAPARE - Administrator, Civil Service (Medical): No  Lack of Transportation (Non-Medical): No  Physical Activity: Not on file  Stress: No Stress Concern Present (07/17/2022)   Received from [facility], [facility] of Occupational Health - Occupational Stress Questionnaire    Feeling of Stress : Not at all  Social Connections: Unknown (07/16/2022)   Received from [facility], [facility]   Social Network    Social Network: Not on file   Additional Social History:    Sleep: Poor  Appetite:  Fair  Current Medications: Current Facility-Administered Medications  Medication Dose Route Frequency Provider Last Rate Last Admin   acetaminophen  (TYLENOL) tablet 650 mg  650 mg Oral Q6H PRN [name], NP   650 mg at 05/12/23 1631   alum & mag hydroxide-simeth (MAALOX/MYLANTA) 200-200-20 MG/5ML suspension 30 mL  30 mL Oral Q4H PRN [name], NP   30 mL at 01/28/23 1530   busPIRone (BUSPAR) tablet 15 mg  15 mg Oral BID [name], NP   15 mg at 05/15/23 0753   cyanocobalamin (VITAMIN B12) injection 1,000 mcg  1,000 mcg Intramuscular Q30 days Sarita Bottom, MD   1,000 mcg at 05/03/23 1257   diphenhydrAMINE (BENADRYL) capsule 50 mg  50 mg Oral TID PRN [name], NP   50 mg at 01/15/23 1211   Or   diphenhydrAMINE (BENADRYL) injection 50 mg  50 mg Intramuscular TID PRN [name], NP       docusate sodium (COLACE) capsule 100 mg  100 mg Oral Daily [name], NP   100 mg at 05/15/23 0754   feeding supplement (ENSURE ENLIVE / ENSURE PLUS) liquid 237 mL  237 mL Oral TID BM Nkwenti, Tyler Aas, NP   237 mL at 05/15/23 1002   haloperidol (HALDOL) tablet 5 mg  5 mg Oral TID PRN [name], NP   5 mg at 05/04/23 1837   Or   haloperidol lactate (HALDOL) injection 5 mg  5 mg Intramuscular TID PRN [name], NP       hydrOXYzine (ATARAX) tablet 25 mg  25 mg Oral TID PRN [name], DO   25 mg at 05/09/23 1834   LORazepam (ATIVAN) tablet 2 mg  2 mg Oral TID PRN [name], NP   2 mg at 01/15/23 1211   Or   LORazepam (ATIVAN) injection 2 mg  2 mg Intramuscular TID PRN [name], NP       magnesium hydroxide (MILK OF MAGNESIA) suspension 30 mL  30 mL Oral Daily PRN [name], NP   30 mL at 04/13/23 0814   melatonin tablet 5 mg  5 mg Oral QHS Nkwenti, Doris, NP   5 mg at 05/14/23 2111   nicotine (NICODERM CQ - dosed in mg/24 hours) patch 21 mg  21 mg Transdermal Daily [name], DO   21 mg at 05/14/23 1660   nicotine polacrilex (NICORETTE) gum 2 mg  2 mg Oral PRN [name], MD   2 mg at 05/14/23 1304   paliperidone (INVEGA) 24 hr tablet 6 mg  6 mg Oral Daily [name], NP   6 mg at 05/14/23 2111    pantoprazole (PROTONIX) EC tablet 40 mg  40 mg Oral Daily [name], NP   40 mg at 05/15/23 0753   polyethylene glycol (MIRALAX / GLYCOLAX) packet 17 g  17 g Oral Daily PRN [name], NP   17 g at 04/13/23 1734   sertraline (ZOLOFT) tablet 150 mg  150 mg Oral  Daily Massengill, Harrold Donath, MD   150 mg at 05/15/23 0753   SUMAtriptan (IMITREX) tablet 25 mg  25 mg Oral BID PRN [name], NP   25 mg at 05/07/23 2112   traZODone (DESYREL) tablet 50 mg  50 mg Oral QHS [name], NP   50 mg at 05/14/23 2111   Vitamin D (Ergocalciferol) (DRISDOL) 1.25 MG (50000 UNIT) capsule 50,000 Units  50,000 Units Oral Q7 days [name], NP   50,000 Units at 05/11/23 1431   Lab Results: No results found for this or any previous visit (from the past 48 hour(s)).  Blood Alcohol level:  Lab Results  Component Value Date   ETH <10 12/19/2022   ETH <10 11/21/2019   Metabolic Disorder Labs: Lab Results  Component Value Date   HGBA1C 4.4 (L) 12/21/2022   MPG 79.58 12/21/2022   No results found for: "PROLACTIN" Lab Results  Component Value Date   CHOL 218 (H) 12/21/2022   TRIG 81 12/21/2022   HDL 53 12/21/2022   CHOLHDL 4.1 12/21/2022   VLDL 16 12/21/2022   LDLCALC 149 (H) 12/21/2022   LDLCALC 110 (H) 03/13/2011   Physical Findings: AIMS: Facial and Oral Movements Muscles of Facial Expression: None Lips and Perioral Area: None Jaw: None Tongue: None,Extremity Movements Upper (arms, wrists, hands, fingers): None Lower (legs, knees, ankles, toes): None, Trunk Movements Neck, shoulders, hips: None, Global Judgements Severity of abnormal movements overall : None Incapacitation due to abnormal movements: None Patient's awareness of abnormal movements: No Awareness, Dental Status Current problems with teeth and/or dentures?: No Does patient usually wear dentures?: No  CIWA:    COWS:     Musculoskeletal: Strength & Muscle Tone: within normal limits Gait & Station: normal Patient  leans: N/A  Psychiatric Specialty Exam:  Presentation  General Appearance:  Casual  Eye Contact: Fair  Speech: Clear and Coherent  Speech Volume: Normal  Handedness: Right  Mood and Affect  Mood: Anxious  Affect: Congruent  Thought Process  Thought Processes: Linear  Descriptions of Associations:Loose  Orientation:Full (Time, Place and Person)  Thought Content:Rumination  History of Schizophrenia/Schizoaffective disorder:Yes  Duration of Psychotic Symptoms:Greater than six months  Hallucinations:Hallucinations: None Description of Auditory Hallucinations: Denies  Ideas of Reference:None (Denies)  Suicidal Thoughts:Suicidal Thoughts: No SI Active Intent and/or Plan: -- (Denies) SI Passive Intent and/or Plan: -- (Denies)  Homicidal Thoughts:Homicidal Thoughts: No  Sensorium  Memory: Immediate Fair; Recent Fair  Judgment: Poor  Insight: Poor  Executive Functions  Concentration: Fair  Attention Span: Fair  Recall: [employer] of Knowledge: Fair  Language: Fair  Psychomotor Activity  Psychomotor Activity: Psychomotor Activity: Normal  Assets  Assets: Communication Skills; Desire for Improvement; Physical Health; Resilience  Sleep  Sleep: Sleep: Good Number of Hours of Sleep: 6.5  Physical Exam: Physical Exam Vitals and nursing note reviewed.  HENT:     Head: Normocephalic and atraumatic.     Nose: Nose normal.     Mouth/Throat:     Mouth: Mucous membranes are moist.  Eyes:     Extraocular Movements: Extraocular movements intact.  Cardiovascular:     Rate and Rhythm: Normal rate.     Pulses: Normal pulses.  Pulmonary:     Effort: Pulmonary effort is normal.  Abdominal:     Comments: Deferred  Genitourinary:    Comments: Deferred Musculoskeletal:        General: Normal range of motion.     Cervical back: Normal range of motion.  Skin:  General: Skin is warm.  Neurological:     General: No focal deficit  present.     Mental Status: She is alert.  Psychiatric:        Attention and Perception: She perceives auditory hallucinations.        Mood and Affect: Mood is anxious.        Behavior: Behavior normal.        Thought Content: Thought content is delusional.     Comments: Calm     Review of Systems  Constitutional:  Negative for fever.  Gastrointestinal:  Negative for constipation, diarrhea, nausea and vomiting.  Genitourinary:  Negative for dysuria.  Musculoskeletal:  Negative for myalgias.  Psychiatric/Behavioral:  Positive for hallucinations.    Blood pressure (!) 93/44, pulse 87, temperature 97.6 F (36.4 C), temperature source Oral, resp. rate 16, height 4\' 11"  (1.499 m), weight 63 kg, SpO2 99%. Body mass index is 28.05 kg/m.   Treatment Plan Summary: Daily contact with patient to assess and evaluate symptoms and progress in treatment and Medication management.    Will continue to monitor side pain, patient is reporting patient was sensitive to light touch on the area. At this time it may be 2/2 to patient constantly lying on that side. Patient not having urinary concerns currently.     Still waiting on safe disposition as patient would not be able to live independently given cognitive impairment. Recommend assisted living.     No medication side effects reported.  Because of urinary incontinence, recommended adult diapers to be worn.   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  Plan:  -Continue Imitrex 25 mg PRN BID for migraines -Continue Colace 100 mg daily for constipation -Continue Vitamin D 50.000 units weekly for bone health. -Continue Sertraline150 mg po Q daily for depression/anxiety -Continue Buspar 15 mg po bid for anxiety.   -Continue paliperidone 6 mg po qd for mood stabilization -Continue Trazodone 50 mg nightly for insomnia  -Continue Nicoderm 21 mg topically Q 24 hrs for nicotine withdrawal management. -Continue vitamin B12 1000 mcg IM weekly for 4 weeks then monthly -Continue Melatonin 5 mg nightly for sleep -Continue Protonix EC 40 mg p.o. daily for GERD -Continue MiraLAX 17 g p.o. PRN for constipation -Continue hydroxyzine 25 mg p.o. 3 times daily as needed for anxiety -Continue Ensure nutritional shakes TID in between meals  -Previously discontinued Hydroxyzine 50 mg nightly-hypotension in the mornings   10/31 CBC within normal level, CMP within normal level   Safety and Monitoring: Voluntary admission to inpatient psychiatric unit for safety, stabilization and treatment Daily contact with patient to assess and evaluate symptoms and progress in treatment Patient's case to be discussed in multi-disciplinary team meeting Observation Level : q15 minute checks Vital signs: q12 hours Precautions: Safety   Discharge Planning: Social work and case management to assist with discharge planning and identification of hospital follow-up needs prior to discharge Estimated LOS: Unknown at this time. Discharge Concerns: Need to establish a safety plan; Medication compliance and effectiveness Discharge Goals: Return home with outpatient referrals for mental health follow-up including medication management/psychotherapy No legal guardian, has payee  Cecilie Lowers, FNP 05/15/2023, 11:47 AM Patient ID: Yolanda [name], female   DOB: 10-19-1975, 47 y.o.   MRN: [id number]

## 2023-05-15 NOTE — Progress Notes (Signed)
Chaplain met with Yolanda [name] at her request.  She was tearful and shared that she feels she has "brought this on herself." It was difficult to fully understand what she was saying, but it sounds as though she has some significant spiritual distress about wanting to die.  Chaplain not able to assess if thoughts were current or if she was feeling shame and guilt from previous thoughts. She shared feeling sad that her mother now is stepping up to be a mother when she did not before.  She regrets not choosing to go with her father.   She continues to want to be a good mother and grandmother and is excited that her grandson will be visiting.  Her face lights up whenever she talks about him.  Chaplain provided emotional and spiritual support through prayer and listening.    [address], Bcc Pager, [phone]

## 2023-05-15 NOTE — Progress Notes (Signed)
   05/15/23 0815  Psych Admission Type (Psych Patients Only)  Admission Status Involuntary  Psychosocial Assessment  Patient Complaints Anxiety;Depression  Eye Contact Fair  Facial Expression Anxious;Flat  Affect Appropriate to circumstance  Speech Logical/coherent  Interaction Assertive;Childlike  Motor Activity Slow  Appearance/Hygiene Unremarkable  Behavior Characteristics Cooperative  Mood Anxious;Depressed  Thought Process  Coherency WDL  Content WDL  Delusions None reported or observed  Perception WDL  Hallucination None reported or observed  Judgment Impaired  Confusion None  Danger to Self  Current suicidal ideation? Denies  Agreement Not to Harm Self Yes  Description of Agreement Verbal  Danger to Others  Danger to Others None reported or observed

## 2023-05-15 NOTE — Progress Notes (Signed)
   05/14/23 2041  Psych Admission Type (Psych Patients Only)  Admission Status Involuntary  Psychosocial Assessment  Patient Complaints Anxiety;Depression  Eye Contact Fair  Facial Expression Flat  Affect Appropriate to circumstance  Speech Logical/coherent  Interaction Assertive;Childlike  Motor Activity Slow  Appearance/Hygiene Unremarkable  Behavior Characteristics Cooperative  Mood Anxious  Thought Process  Coherency WDL  Content WDL  Delusions None reported or observed  Perception WDL  Hallucination None reported or observed  Judgment Impaired  Confusion None  Danger to Self  Current suicidal ideation? Denies  Agreement Not to Harm Self Yes  Description of Agreement verbal

## 2023-05-15 NOTE — Plan of Care (Signed)
  Problem: Education: Goal: Knowledge of the prescribed therapeutic regimen will improve Outcome: Progressing   Problem: Activity: Goal: Imbalance in normal sleep/wake cycle will improve Outcome: Progressing   Problem: Coping: Goal: Coping ability will improve Outcome: Progressing Goal: Will verbalize feelings Outcome: Progressing   Problem: Safety: Goal: Ability to disclose and discuss suicidal ideas will improve Outcome: Progressing Goal: Ability to identify and utilize support systems that promote safety will improve Outcome: Progressing

## 2023-05-15 NOTE — Group Note (Signed)
Recreation Therapy Group Note   Group Topic:Other  Group Date: 05/15/2023 Start Time: 1403 End Time: 1450 Facilitators: Hugh Garrow-McCall, LRT,CTRS Location: [address] Dayroom   Activity Description/Intervention: Therapeutic Drumming. Patients with peers and staff were given the opportunity to engage in a leader facilitated [facility] Group Empowerment Drumming Circle with staff from the [employer], in partnership with [employer]. Teaching laboratory technician and trained [employer], Theodoro Doing leading with LRT observing and documenting intervention and pt response. This evidenced-based practice targets 7 areas of health and wellbeing in the human experience including: stress-reduction, exercise, self-expression, camaraderie/support, nurturing, spirituality, and music-making (leisure).   Goal Area(s) Addresses:  Patient will engage in pro-social way in music group.  Patient will follow directions of drum leader on the first prompt. Patient will demonstrate no behavioral issues during group.  Patient will identify if a reduction in stress level occurs as a result of participation in therapeutic drum circle.    Education: Leisure exposure, Pharmacologist, Musical expression, Discharge Planning   Affect/Mood: N/A   Participation Level: Did not attend    Clinical Observations/Individualized Feedback:     Plan: Continue to engage patient in RT group sessions 2-3x/week.   Nyelah Emmerich-McCall, LRT,CTRS  05/15/2023 3:19 PM

## 2023-05-15 NOTE — BH IP Treatment Plan (Signed)
Interdisciplinary Treatment and Diagnostic Plan Update  05/15/2023 Time of Session: 9:30 AM ( update )  Yolanda [name] MRN: [id number]  Principal Diagnosis: Schizoaffective disorder, depressive type (HCC)  Secondary Diagnoses: Principal Problem:   Schizoaffective disorder, depressive type (HCC) Active Problems:   GERD (gastroesophageal reflux disease)   Intellectual disability   Tobacco use disorder   Current Medications:  Current Facility-Administered Medications  Medication Dose Route Frequency Provider Last Rate Last Admin   acetaminophen (TYLENOL) tablet 650 mg  650 mg Oral Q6H PRN [name], NP   650 mg at 05/12/23 1631   alum & mag hydroxide-simeth (MAALOX/MYLANTA) 200-200-20 MG/5ML suspension 30 mL  30 mL Oral Q4H PRN [name], NP   30 mL at 01/28/23 1530   busPIRone (BUSPAR) tablet 15 mg  15 mg Oral BID [name], NP   15 mg at 05/15/23 0753   cyanocobalamin (VITAMIN B12) injection 1,000 mcg  1,000 mcg Intramuscular Q30 days Sarita Bottom, MD   1,000 mcg at 05/03/23 1257   diphenhydrAMINE (BENADRYL) capsule 50 mg  50 mg Oral TID PRN [name], NP   50 mg at 01/15/23 1211   Or   diphenhydrAMINE (BENADRYL) injection 50 mg  50 mg Intramuscular TID PRN [name], NP       docusate sodium (COLACE) capsule 100 mg  100 mg Oral Daily [name], NP   100 mg at 05/15/23 0754   feeding supplement (ENSURE ENLIVE / ENSURE PLUS) liquid 237 mL  237 mL Oral TID BM Nkwenti, Doris, NP   237 mL at 05/15/23 1002   haloperidol (HALDOL) tablet 5 mg  5 mg Oral TID PRN [name], NP   5 mg at 05/04/23 1837   Or   haloperidol lactate (HALDOL) injection 5 mg  5 mg Intramuscular TID PRN [name], NP       hydrOXYzine (ATARAX) tablet 25 mg  25 mg Oral TID PRN [name], DO   25 mg at 05/09/23 1834   LORazepam (ATIVAN) tablet 2 mg  2 mg Oral TID PRN [name], NP   2 mg at 01/15/23 1211   Or   LORazepam (ATIVAN) injection 2 mg  2 mg Intramuscular TID PRN  [name], NP       magnesium hydroxide (MILK OF MAGNESIA) suspension 30 mL  30 mL Oral Daily PRN [name], NP   30 mL at 04/13/23 0814   melatonin tablet 5 mg  5 mg Oral QHS Nkwenti, Doris, NP   5 mg at 05/14/23 2111   nicotine (NICODERM CQ - dosed in mg/24 hours) patch 21 mg  21 mg Transdermal Daily [name], DO   21 mg at 05/14/23 8469   nicotine polacrilex (NICORETTE) gum 2 mg  2 mg Oral PRN [name], MD   2 mg at 05/14/23 1304   paliperidone (INVEGA) 24 hr tablet 6 mg  6 mg Oral Daily Nkwenti, Tyler Aas, NP   6 mg at 05/14/23 2111   pantoprazole (PROTONIX) EC tablet 40 mg  40 mg Oral Daily [name], NP   40 mg at 05/15/23 0753   polyethylene glycol (MIRALAX / GLYCOLAX) packet 17 g  17 g Oral Daily PRN [name], NP   17 g at 04/13/23 1734   sertraline (ZOLOFT) tablet 150 mg  150 mg Oral Daily Massengill, Harrold Donath, MD   150 mg at 05/15/23 0753   SUMAtriptan (IMITREX) tablet 25 mg  25 mg Oral BID PRN [name],  NP   25 mg at 05/07/23 2112   traZODone (DESYREL) tablet 50 mg  50 mg Oral QHS [name], NP   50 mg at 05/14/23 2111   Vitamin D (Ergocalciferol) (DRISDOL) 1.25 MG (50000 UNIT) capsule 50,000 Units  50,000 Units Oral Q7 days [name], NP   50,000 Units at 05/11/23 1431   PTA Medications: Medications Prior to Admission  Medication Sig Dispense Refill Last Dose   busPIRone (BUSPAR) 15 MG tablet Take 15 mg by mouth 2 (two) times daily. (Patient not taking: Reported on 12/19/2022)      paliperidone (INVEGA SUSTENNA) 156 MG/ML SUSY injection Inject 156 mg into the muscle once. (Patient not taking: Reported on 12/19/2022)      sertraline (ZOLOFT) 50 MG tablet Take 150 mg by mouth daily. (Patient not taking: Reported on 12/19/2022)      traZODone (DESYREL) 100 MG tablet Take 100 mg by mouth at bedtime as needed for sleep. (Patient not taking: Reported on 11/12/2022)       Patient Stressors: Medication change or noncompliance    Patient Strengths:  Forensic psychologist fund of knowledge   Treatment Modalities: Medication Management, Group therapy, Case management,  1 to 1 session with clinician, Psychoeducation, Recreational therapy.   Physician Treatment Plan for Primary Diagnosis: Schizoaffective disorder, depressive type (HCC) Long Term Goal(s): Improvement in symptoms so as ready for discharge   Short Term Goals: Ability to identify and develop effective coping behaviors will improve Ability to maintain clinical measurements within normal limits will improve Compliance with prescribed medications will improve Ability to identify triggers associated with substance abuse/mental health issues will improve Ability to identify changes in lifestyle to reduce recurrence of condition will improve Ability to verbalize feelings will improve Ability to disclose and discuss suicidal ideas Ability to demonstrate self-control will improve  Medication Management: Evaluate patient's response, side effects, and tolerance of medication regimen.  Therapeutic Interventions: 1 to 1 sessions, Unit Group sessions and Medication administration.  Evaluation of Outcomes: Progressing  Physician Treatment Plan for Secondary Diagnosis: Principal Problem:   Schizoaffective disorder, depressive type (HCC) Active Problems:   GERD (gastroesophageal reflux disease)   Intellectual disability   Tobacco use disorder  Long Term Goal(s): Improvement in symptoms so as ready for discharge   Short Term Goals: Ability to identify and develop effective coping behaviors will improve Ability to maintain clinical measurements within normal limits will improve Compliance with prescribed medications will improve Ability to identify triggers associated with substance abuse/mental health issues will improve Ability to identify changes in lifestyle to reduce recurrence of condition will improve Ability to verbalize feelings will improve Ability to disclose  and discuss suicidal ideas Ability to demonstrate self-control will improve     Medication Management: Evaluate patient's response, side effects, and tolerance of medication regimen.  Therapeutic Interventions: 1 to 1 sessions, Unit Group sessions and Medication administration.  Evaluation of Outcomes: Progressing   RN Treatment Plan for Primary Diagnosis: Schizoaffective disorder, depressive type (HCC) Long Term Goal(s): Knowledge of disease and therapeutic regimen to maintain health will improve  Short Term Goals: Ability to remain free from injury will improve, Ability to verbalize frustration and anger appropriately will improve, Ability to participate in decision making will improve, Ability to verbalize feelings will improve, Ability to identify and develop effective coping behaviors will improve, and Compliance with prescribed medications will improve  Medication Management: RN will administer medications as ordered by provider, will assess and evaluate patient's response and provide education to  patient for prescribed medication. RN will report any adverse and/or side effects to prescribing provider.  Therapeutic Interventions: 1 on 1 counseling sessions, Psychoeducation, Medication administration, Evaluate responses to treatment, Monitor vital signs and CBGs as ordered, Perform/monitor CIWA, COWS, AIMS and Fall Risk screenings as ordered, Perform wound care treatments as ordered.  Evaluation of Outcomes: Progressing   LCSW Treatment Plan for Primary Diagnosis: Schizoaffective disorder, depressive type (HCC) Long Term Goal(s): Safe transition to appropriate next level of care at discharge, Engage patient in therapeutic group addressing interpersonal concerns.  Short Term Goals: Engage patient in aftercare planning with referrals and resources, Increase social support, Increase emotional regulation, Facilitate acceptance of mental health diagnosis and concerns, Identify triggers  associated with mental health/substance abuse issues, and Increase skills for wellness and recovery  Therapeutic Interventions: Assess for all discharge needs, 1 to 1 time with Social worker, Explore available resources and support systems, Assess for adequacy in community support network, Educate family and significant other(s) on suicide prevention, Complete Psychosocial Assessment, Interpersonal group therapy.  Evaluation of Outcomes: Not Progressing   Progress in Treatment: Attending groups: Yes. Participating in groups: Yes. Taking medication as prescribed: Yes. Toleration medication: Yes. Family/Significant other contact made: Yes, individual(s) contacted:  DSS ( [place] guardian )  Patient understands diagnosis: Yes. Discussing patient identified problems/goals with staff: Yes. Medical problems stabilized or resolved: Yes. Denies suicidal/homicidal ideation: Yes. Issues/concerns per patient self-inventory: No.  New problem(s) identified: No, Describe:  None reported   New Short Term/Long Term Goal(s):  medication stabilization, elimination of SI thoughts, development of comprehensive mental wellness plan.    Patient Goals:  " placement "   Discharge Plan or Barriers: Patient recently admitted. CSW will continue to follow and assess for appropriate referrals and possible discharge planning.  Pt is awaiting to be placed at [facility] in [place]   Reason for Continuation of Hospitalization: Anxiety Depression Medication stabilization Other; describe Placement   Estimated Length of Stay: waiting on her Medicaid B to be converted to Med C  Last 3 [place] Suicide Severity Risk Score: Flowsheet Row Admission (Current) [facility] from 12/19/2022 in [facility] at [facility] ED from 11/12/2022 in [facility] Emergency Department [facility]  C-SSRS RISK CATEGORY High Risk  High Risk Moderate Risk       Last [facility] 2/9 Scores:     No data to display          Scribe for Treatment Team: Isabella Bowens, Theresia Majors 05/15/2023 1:26 PM

## 2023-05-15 NOTE — BHH Group Notes (Signed)
Adult Psychoeducational Group Note  Date:  05/15/2023 Time:  10:32 AM  Group Topic/Focus:  Emotional Education:   The focus of this group is to discuss what feelings/emotions are, and how they are experienced. Goals Group:   The focus of this group is to help patients establish daily goals to achieve during treatment and discuss how the patient can incorporate goal setting into their daily lives to aide in recovery.  Participation Level:  Did Not Attend  Participation Quality:  na  Affect:  na  Cognitive:  na  Insight: na  Engagement in Group:  na  Modes of Intervention:  na  Additional Comments:  Pt did not attend group  Burnett Sheng 05/15/2023, 10:32 AM

## 2023-05-16 DIAGNOSIS — F251 Schizoaffective disorder, depressive type: Secondary | ICD-10-CM | POA: Diagnosis not present

## 2023-05-16 NOTE — Progress Notes (Signed)
[facility] MD Progress Note  05/16/2023 2:03 PM Yolanda [name]  MRN:  [id number]         Principal Problem: Schizoaffective disorder, depressive type (HCC) Diagnosis: Principal Problem:   Schizoaffective disorder, depressive type (HCC) Active Problems:   GERD (gastroesophageal reflux disease)   Intellectual disability   Tobacco use disorder  Today's assessment note:  Yolanda [name] was seen and examined today in her room lying on her bed.  She is alert, oriented to name, time of day, and situation.  Mood appears less depressed and patient has some smiles to her face. She reports anxiety #3/10, and depression of #210, with 10 being high severity. She reports that her daughter/grandson did not visit as planned yesterday, but rescheduled for another time.  [name] reports that her goal is to continue studying her bible and to prepare for her GED.  GED study book observed by patient's bedside.  Support and encouragement provided as needed.  Room smells like dry urine, patient made aware to take a shower with change of clothing.  Environmental service staff cleaning patient's room at this time.  She reports sleeping well last night and being restful, nursing staff noted in flowsheet patient sleeping over 9 hours last night.  Reports good appetite and drinking enough fluids for hydration.  She denies acute discomfort.  Endorses taking her medications as ordered without any adverse effect.  Denies SI, HI, or AVH.  We will continue to monitor patient for safety as our plan for discharge is still pending.  Total Time spent with patient: 35 minutes.  Past Psychiatric History: see H&P  Past Medical History:  Past Medical History:  Diagnosis Date   Bipolar affect, depressed (HCC)    Constipation 08/17/2022   Depression    Falls 07/21/2022   Fracture of femoral neck, right, closed (HCC) 01/17/2022   Herpes simplex 08/22/2017   Open fracture dislocation of right elbow joint 01/17/2022    Past Surgical History:   Procedure Laterality Date   NO PAST SURGERIES     SALPINGECTOMY     Family History: History reviewed. No pertinent family history. Family Psychiatric  History: see H&P Social History:  Social History   Substance and Sexual Activity  Alcohol Use Yes     Social History   Substance and Sexual Activity  Drug Use Yes   Types: Cocaine, Marijuana    Social History   Socioeconomic History   Marital status: Single    Spouse name: Not on file   Number of children: Not on file   Years of education: Not on file   Highest education level: Not on file  Occupational History   Not on file  Tobacco Use   Smoking status: Every Day   Smokeless tobacco: Not on file  Substance and Sexual Activity   Alcohol use: Yes   Drug use: Yes    Types: Cocaine, Marijuana   Sexual activity: Yes  Other Topics Concern   Not on file  Social History Narrative   Not on file   Social Determinants of Health   Financial Resource Strain: Low Risk  (09/12/2022)   Received from [facility], [facility]   Overall Financial Resource Strain (CARDIA)    Difficulty of Paying Living Expenses: Not hard at all  Food Insecurity: Patient Declined (12/20/2022)   Hunger Vital Sign    Worried About Running Out of Food in the Last Year: Patient declined    Ran Out of Food in the Last Year: Patient declined  Transportation Needs: No Transportation Needs (12/20/2022)   PRAPARE - Administrator, Civil Service (Medical): No    Lack of Transportation (Non-Medical): No  Physical Activity: Not on file  Stress: No Stress Concern Present (07/17/2022)   Received from [facility], [facility] of Occupational Health - Occupational Stress Questionnaire    Feeling of Stress : Not at all  Social Connections: Unknown (07/16/2022)   Received from [facility], [facility]   Social Network    Social Network: Not on file   Additional Social History:    Sleep: Poor  Appetite:   Fair  Current Medications: Current Facility-Administered Medications  Medication Dose Route Frequency Provider Last Rate Last Admin   acetaminophen (TYLENOL) tablet 650 mg  650 mg Oral Q6H PRN [name], NP   650 mg at 05/12/23 1631   alum & mag hydroxide-simeth (MAALOX/MYLANTA) 200-200-20 MG/5ML suspension 30 mL  30 mL Oral Q4H PRN [name], NP   30 mL at 01/28/23 1530   busPIRone (BUSPAR) tablet 15 mg  15 mg Oral BID [name], NP   15 mg at 05/16/23 0758   cyanocobalamin (VITAMIN B12) injection 1,000 mcg  1,000 mcg Intramuscular Q30 days Sarita Bottom, MD   1,000 mcg at 05/03/23 1257   diphenhydrAMINE (BENADRYL) capsule 50 mg  50 mg Oral TID PRN [name], NP   50 mg at 01/15/23 1211   Or   diphenhydrAMINE (BENADRYL) injection 50 mg  50 mg Intramuscular TID PRN [name], NP       docusate sodium (COLACE) capsule 100 mg  100 mg Oral Daily Nkwenti, Tyler Aas, NP   100 mg at 05/16/23 0758   feeding supplement (ENSURE ENLIVE / ENSURE PLUS) liquid 237 mL  237 mL Oral TID BM Nkwenti, Doris, NP   237 mL at 05/16/23 1028   haloperidol (HALDOL) tablet 5 mg  5 mg Oral TID PRN [name], NP   5 mg at 05/04/23 1837   Or   haloperidol lactate (HALDOL) injection 5 mg  5 mg Intramuscular TID PRN [name], NP       hydrOXYzine (ATARAX) tablet 25 mg  25 mg Oral TID PRN [name], DO   25 mg at 05/15/23 1847   LORazepam (ATIVAN) tablet 2 mg  2 mg Oral TID PRN [name], NP   2 mg at 01/15/23 1211   Or   LORazepam (ATIVAN) injection 2 mg  2 mg Intramuscular TID PRN [name], NP       magnesium hydroxide (MILK OF MAGNESIA) suspension 30 mL  30 mL Oral Daily PRN [name], NP   30 mL at 04/13/23 0814   melatonin tablet 5 mg  5 mg Oral QHS Nkwenti, Doris, NP   5 mg at 05/15/23 2112   nicotine (NICODERM CQ - dosed in mg/24 hours) patch 21 mg  21 mg Transdermal Daily [name], DO   21 mg at 05/14/23 3244   nicotine polacrilex (NICORETTE) gum 2 mg  2 mg Oral PRN  [name], MD   2 mg at 05/16/23 1308   paliperidone (INVEGA) 24 hr tablet 6 mg  6 mg Oral Daily [name], NP   6 mg at 05/15/23 2113   pantoprazole (PROTONIX) EC tablet 40 mg  40 mg Oral Daily [name], NP   40 mg at 05/16/23 0758   polyethylene glycol (MIRALAX / GLYCOLAX) packet 17 g  17 g Oral Daily  PRN [name], NP   17 g at 04/13/23 1734   sertraline (ZOLOFT) tablet 150 mg  150 mg Oral Daily Massengill, Harrold Donath, MD   150 mg at 05/16/23 0758   SUMAtriptan (IMITREX) tablet 25 mg  25 mg Oral BID PRN [name], NP   25 mg at 05/07/23 2112   traZODone (DESYREL) tablet 50 mg  50 mg Oral QHS [name], NP   50 mg at 05/15/23 2113   Vitamin D (Ergocalciferol) (DRISDOL) 1.25 MG (50000 UNIT) capsule 50,000 Units  50,000 Units Oral Q7 days [name], NP   50,000 Units at 05/11/23 1431   Lab Results: No results found for this or any previous visit (from the past 48 hour(s)).  Blood Alcohol level:  Lab Results  Component Value Date   ETH <10 12/19/2022   ETH <10 11/21/2019   Metabolic Disorder Labs: Lab Results  Component Value Date   HGBA1C 4.4 (L) 12/21/2022   MPG 79.58 12/21/2022   No results found for: "PROLACTIN" Lab Results  Component Value Date   CHOL 218 (H) 12/21/2022   TRIG 81 12/21/2022   HDL 53 12/21/2022   CHOLHDL 4.1 12/21/2022   VLDL 16 12/21/2022   LDLCALC 149 (H) 12/21/2022   LDLCALC 110 (H) 03/13/2011   Physical Findings: AIMS: Facial and Oral Movements Muscles of Facial Expression: None Lips and Perioral Area: None Jaw: None Tongue: None,Extremity Movements Upper (arms, wrists, hands, fingers): None Lower (legs, knees, ankles, toes): None, Trunk Movements Neck, shoulders, hips: None, Global Judgements Severity of abnormal movements overall : None Incapacitation due to abnormal movements: None Patient's awareness of abnormal movements: No Awareness, Dental Status Current problems with teeth and/or dentures?: No Does  patient usually wear dentures?: No  CIWA:    COWS:     Musculoskeletal: Strength & Muscle Tone: within normal limits Gait & Station: normal Patient leans: N/A  Psychiatric Specialty Exam:  Presentation  General Appearance:  Casual  Eye Contact: Fair  Speech: Clear and Coherent; Slow  Speech Volume: Normal  Handedness: Right  Mood and Affect  Mood: Anxious  Affect: Congruent  Thought Process  Thought Processes: Linear  Descriptions of Associations:Loose  Orientation:Full (Time, Place and Person)  Thought Content:Rumination  History of Schizophrenia/Schizoaffective disorder:Yes  Duration of Psychotic Symptoms:Greater than six months  Hallucinations:Hallucinations: None Description of Auditory Hallucinations: Denies  Ideas of Reference:None  Suicidal Thoughts:Suicidal Thoughts: No SI Active Intent and/or Plan: -- (Denies) SI Passive Intent and/or Plan: -- (Denies)  Homicidal Thoughts:Homicidal Thoughts: No  Sensorium  Memory: Immediate Fair; Recent Fair  Judgment: Poor  Insight: Poor  Executive Functions  Concentration: Fair  Attention Span: Fair  Recall: [employer] of Knowledge: Fair  Language: Fair  Psychomotor Activity  Psychomotor Activity: Psychomotor Activity: Normal  Assets  Assets: Communication Skills; Physical Health; Resilience  Sleep  Sleep: Sleep: Good Number of Hours of Sleep: 9  Physical Exam: Physical Exam Vitals and nursing note reviewed.  Constitutional:      General: She is not in acute distress.    Appearance: She is not toxic-appearing.  HENT:     Head: Normocephalic and atraumatic.     Nose: Nose normal.     Mouth/Throat:     Mouth: Mucous membranes are moist.  Eyes:     Extraocular Movements: Extraocular movements intact.  Cardiovascular:     Rate and Rhythm: Normal rate.     Pulses: Normal pulses.  Pulmonary:     Effort: Pulmonary effort is normal.  Abdominal:     Comments:  Deferred  Genitourinary:    Comments: Deferred Musculoskeletal:        General: Normal range of motion.     Cervical back: Normal range of motion.  Skin:    General: Skin is warm.  Neurological:     General: No focal deficit present.     Mental Status: She is alert.  Psychiatric:        Attention and Perception: She perceives auditory hallucinations.        Mood and Affect: Mood is anxious.        Behavior: Behavior normal.        Thought Content: Thought content is delusional.     Comments: Calm     Review of Systems  Constitutional:  Negative for fever.  Gastrointestinal:  Negative for constipation, diarrhea, nausea and vomiting.  Genitourinary:  Negative for dysuria.  Musculoskeletal:  Negative for myalgias.  Psychiatric/Behavioral:  Positive for hallucinations.    Blood pressure 93/66, pulse (!) 59, temperature 97.6 F (36.4 C), temperature source Oral, resp. rate 16, height 4\' 11"  (1.499 m), weight 63 kg, SpO2 99%. Body mass index is 28.05 kg/m.  Treatment Plan Summary: Daily contact with patient to assess and evaluate symptoms and progress in treatment and Medication management.    Will continue to monitor side pain, patient is reporting patient was sensitive to light touch on the area. At this time it may be 2/2 to patient constantly lying on that side. Patient not having urinary concerns currently.     Still waiting on safe disposition as patient would not be able to live independently given cognitive impairment. Recommend assisted living.     No medication side effects reported.  Because of urinary incontinence, recommended adult diapers to be worn.   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  Plan:  -Continue Imitrex 25 mg  PRN BID for migraines -Continue Colace 100 mg daily for constipation -Continue Vitamin D 50.000 units weekly for bone health. -Continue Sertraline150 mg po Q daily for depression/anxiety -Continue Buspar 15 mg po bid for anxiety.  -Continue paliperidone 6 mg po qd for mood stabilization -Continue Trazodone 50 mg nightly for insomnia  -Continue Nicoderm 21 mg topically Q 24 hrs for nicotine withdrawal management. -Continue vitamin B12 1000 mcg IM weekly for 4 weeks then monthly -Continue Melatonin 5 mg nightly for sleep -Continue Protonix EC 40 mg p.o. daily for GERD -Continue MiraLAX 17 g p.o. PRN for constipation -Continue hydroxyzine 25 mg p.o. 3 times daily as needed for anxiety -Continue Ensure nutritional shakes TID in between meals  -Previously discontinued Hydroxyzine 50 mg nightly-hypotension in the mornings   10/31 CBC within normal level, CMP within normal level   Safety and Monitoring: Voluntary admission to inpatient psychiatric unit for safety, stabilization and treatment Daily contact with patient to assess and evaluate symptoms and progress in treatment Patient's case to be discussed in multi-disciplinary team meeting Observation Level : q15 minute checks Vital signs: q12 hours Precautions: Safety   Discharge Planning: Social work and case management to assist with discharge planning and identification of hospital follow-up needs prior to discharge Estimated LOS: Unknown at this time. Discharge Concerns: Need to establish a safety plan; Medication compliance and effectiveness Discharge Goals: Return home with outpatient referrals for mental health follow-up including medication management/psychotherapy No legal guardian,  has payee  Cecilie Lowers, FNP 05/16/2023, 2:03 PM Patient ID: Larey Brick, female   DOB: [birth year], 47 y.o.   MRN: [id number] Patient ID: KEIGHLEY DECKMAN, female   DOB: 1976-03-17, 47 y.o.   MRN: [id number]

## 2023-05-16 NOTE — [facility] Group Notes (Signed)
Adult Psychoeducational Group Note  Date:  05/16/2023 Time:  11:45 AM  Group Topic/Focus:  Early Warning Signs:   The focus of this group is to help patients identify signs or symptoms they exhibit before slipping into an unhealthy state or crisis. Goals Group:   The focus of this group is to help patients establish daily goals to achieve during treatment and discuss how the patient can incorporate goal setting into their daily lives to aide in recovery.  Participation Level:  Did Not Attend  Participation Quality:  na  Affect:  na  Cognitive:  na  Insight: na  Engagement in Group:  na  Modes of Intervention:  na  Additional Comments:  Pt did not attend group  Shontay Wallner 05/16/2023, 11:45 AM

## 2023-05-16 NOTE — Progress Notes (Signed)
   05/16/23 0810  Psych Admission Type (Psych Patients Only)  Admission Status Involuntary  Psychosocial Assessment  Patient Complaints Anxiety;Depression  Eye Contact Fair  Facial Expression Anxious;Flat  Affect Appropriate to circumstance  Speech Logical/coherent  Interaction Childlike;Assertive  Motor Activity Slow  Appearance/Hygiene Unremarkable  Behavior Characteristics Cooperative  Mood Depressed;Anxious  Thought Process  Coherency WDL  Content WDL  Delusions None reported or observed  Perception WDL  Hallucination None reported or observed  Judgment Impaired  Confusion None  Danger to Self  Current suicidal ideation? Denies  Agreement Not to Harm Self Yes  Description of Agreement Verbal  Danger to Others  Danger to Others None reported or observed

## 2023-05-16 NOTE — Group Note (Signed)
LCSW Group Therapy Note   Group Date: 05/16/2023 Start Time: 1100 End Time: 1200  Type of Therapy and Topic:  Group Therapy - Who Am I?  Participation Level:  DID NOT ATTEND     Therapeutic Modalities Cognitive Behavioral Therapy Motivational Interviewing   Kathrynn Humble 05/16/2023  1:36 PM

## 2023-05-16 NOTE — Progress Notes (Signed)
   05/15/23 2055  Psych Admission Type (Psych Patients Only)  Admission Status Involuntary  Psychosocial Assessment  Patient Complaints Anxiety;Depression  Eye Contact Fair  Facial Expression Anxious;Flat  Affect Appropriate to circumstance  Speech Logical/coherent  Interaction Childlike;Assertive  Motor Activity Slow  Appearance/Hygiene Unremarkable  Behavior Characteristics Cooperative  Mood Anxious;Depressed  Thought Process  Coherency WDL  Content WDL  Delusions None reported or observed  Perception WDL  Hallucination None reported or observed  Judgment Impaired  Confusion None  Danger to Self  Current suicidal ideation? Denies  Self-Injurious Behavior No self-injurious ideation or behavior indicators observed or expressed   Agreement Not to Harm Self Yes  Description of Agreement verbal  Danger to Others  Danger to Others None reported or observed

## 2023-05-16 NOTE — Plan of Care (Signed)
  Problem: Activity: Goal: Interest or engagement in leisure activities will improve Outcome: Progressing Goal: Imbalance in normal sleep/wake cycle will improve Outcome: Progressing   Problem: Coping: Goal: Coping ability will improve Outcome: Progressing Goal: Will verbalize feelings Outcome: Progressing   Problem: Medication: Goal: Compliance with prescribed medication regimen will improve Outcome: Progressing   Problem: Safety: Goal: Periods of time without injury will increase Outcome: Progressing

## 2023-05-17 DIAGNOSIS — F251 Schizoaffective disorder, depressive type: Secondary | ICD-10-CM | POA: Diagnosis not present

## 2023-05-17 NOTE — Progress Notes (Signed)
Yolanda [name] is pleasant calm and cooperative this morning She is taking her scheduled meds as directed Vtial signs stable with no complaints

## 2023-05-17 NOTE — [facility] Group Notes (Signed)
[facility] Group Notes:  (Nursing/MHT/Case Management/Adjunct)  Date:  05/17/2023  Time:  8:40 PM  Type of Therapy:   AA group  Participation Level:  Minimal  Participation Quality:    Affect:    Cognitive:    Insight:    Engagement in Group:    Modes of Intervention:    Summary of Progress/Problems: Pt requested to return to her room. Writer provided her with coping skills worksheet.   Yolanda [name] 05/17/2023, 8:40 PM

## 2023-05-17 NOTE — Progress Notes (Signed)
[facility] MD Progress Note  05/17/2023 2:44 PM Yolanda [name]  MRN:  [id number]  Principal Problem: Schizoaffective disorder, depressive type (HCC)  Diagnosis: Principal Problem:   Schizoaffective disorder, depressive type (HCC) Active Problems:   GERD (gastroesophageal reflux disease)   Intellectual disability   Tobacco use disorder  Reason for admission: This is the first psychiatric admission in this [facility] in 12 years for this 50 AA female with an extensive hx of mental illnesses & probable polysubstance use disorders. She is admitted to the [facility] from the [facility] hospital with complain of worsening suicidal ideations with plan to stab herself. Per chart review, patient apparently reported at the ED that she has been depressed for a while & has not been taking her mental health medications. After medical evaluation.clearance, she was transferred to the [facility] for further psychiatric evaluation/treatments.   24 hour chart review: Blood pressures have been within normal limits.  Compliant with medications.  Continuing to require positive reinforcements to take care of personal hygiene needs and to clean her room..  No behavioral episodes in the past 24 hours, did not require any PRNs for agitation or anything else.  Patient assessment note: Mood was depressed earlier today morning, but pt was visible at the nurses' station after lunch interacting with peers and staff. Patient continues to deny SI/HI/AVH, denies paranoia, and there is no evidence of delusional thinking.  Reports a good sleep quality last night, reports a good appetite, continues to verbalize her dissatisfaction with continuous hospitalization. Patient has been educated on the need to engage in group sessions, while waiting for placement, and also on the need to take care of personal hygiene, and has verbalized understanding.  She denies being in any physical pain today, denies any issues with her bowel movements, states the last one was  last night.  All medications being continued as listed below. [name] symptoms remain stable for management outside of the hospital setting, but we continue to await placement by DSS. [name] cognitive limitations continue to hinder placement in other living arrangements that are not guaranteed long term due to concerns for her safety. There are no family members or friends are available to house her. We will continue to follow.   Her daughter presented to the hospital yesterday as per nursing staff and requesting her debit card "to go pay her school fees with it". Pt was authorizing for the card to be released to her daughter because "she said that she would pay me back". Writer asked nursing to not make the card available to daughter, and to let daughter know to ask for DSS to approve the release of the card to her since pt is now under DSS guardianship.  We will continue to monitor, and we will continue medications as listed below, with no changes today.  Total Time spent with patient:  45 minutes  Past Psychiatric History:  See H & P  Past Medical History:  Past Medical History:  Diagnosis Date   Bipolar affect, depressed (HCC)    Constipation 08/17/2022   Depression    Falls 07/21/2022   Fracture of femoral neck, right, closed (HCC) 01/17/2022   Herpes simplex 08/22/2017   Open fracture dislocation of right elbow joint 01/17/2022    Past Surgical History:  Procedure Laterality Date   NO PAST SURGERIES     SALPINGECTOMY     Family History: History reviewed. No pertinent family history.  Family Psychiatric  History: See H & P  Social History:  Social History   Substance and Sexual Activity  Alcohol Use Yes     Social History   Substance and Sexual Activity  Drug Use Yes   Types: Cocaine, Marijuana    Social History   Socioeconomic History   Marital status: Single    Spouse name: Not on file   Number of children: Not on file   Years of education: Not on file   Highest  education level: Not on file  Occupational History   Not on file  Tobacco Use   Smoking status: Every Day   Smokeless tobacco: Not on file  Substance and Sexual Activity   Alcohol use: Yes   Drug use: Yes    Types: Cocaine, Marijuana   Sexual activity: Yes  Other Topics Concern   Not on file  Social History Narrative   Not on file   Social Determinants of Health   Financial Resource Strain: Low Risk  (09/12/2022)   Received from [facility]   Overall Financial Resource Strain (CARDIA)    Difficulty of Paying Living Expenses: Not hard at all  Food Insecurity: Patient Declined (12/20/2022)   Hunger Vital Sign    Worried About Running Out of Food in the Last Year: Patient declined    Ran Out of Food in the Last Year: Patient declined  Transportation Needs: No Transportation Needs (12/20/2022)   PRAPARE - Administrator, Civil Service (Medical): No    Lack of Transportation (Non-Medical): No  Physical Activity: Not on file  Stress: No Stress Concern Present (07/17/2022)   Received from [facility], [facility] - Occupational Stress Questionnaire    Feeling of Stress : Not at all  Social Connections: Unknown (07/16/2022)   Received from [facility]   Social Network    Social Network: Not on file   Sleep: Good  Appetite:  Good  Current Medications: Current Facility-Administered Medications  Medication Dose Route Frequency Provider Last Rate Last Admin   acetaminophen (TYLENOL) tablet 650 mg  650 mg Oral Q6H PRN [name], NP   650 mg at 05/12/23 1631   alum & mag hydroxide-simeth (MAALOX/MYLANTA) 200-200-20 MG/5ML suspension 30 mL  30 mL Oral Q4H PRN [name], NP   30 mL at 01/28/23 1530   busPIRone (BUSPAR) tablet 15 mg  15 mg Oral BID [name], NP   15 mg at 05/17/23 9147   cyanocobalamin (VITAMIN B12) injection 1,000 mcg  1,000 mcg Intramuscular Q30 days Sarita Bottom, MD    1,000 mcg at 05/03/23 1257   diphenhydrAMINE (BENADRYL) capsule 50 mg  50 mg Oral TID PRN [name], NP   50 mg at 01/15/23 1211   Or   diphenhydrAMINE (BENADRYL) injection 50 mg  50 mg Intramuscular TID PRN [name], NP       docusate sodium (COLACE) capsule 100 mg  100 mg Oral Daily Jayce Kainz, NP   100 mg at 05/17/23 0837   feeding supplement (ENSURE ENLIVE / ENSURE PLUS) liquid 237 mL  237 mL Oral TID BM Kaleyah Labreck, NP   237 mL at 05/16/23 2140   haloperidol (HALDOL) tablet 5 mg  5 mg Oral TID PRN [name], NP   5 mg at 05/04/23 1837   Or   haloperidol lactate (HALDOL) injection 5 mg  5 mg Intramuscular TID PRN [name], NP       hydrOXYzine (ATARAX) tablet 25 mg  25 mg Oral TID PRN [name], DO   25 mg at 05/15/23 1847   LORazepam (ATIVAN) tablet 2 mg  2 mg Oral TID PRN [name], NP   2 mg at 01/15/23 1211   Or   LORazepam (ATIVAN) injection 2 mg  2 mg Intramuscular TID PRN [name], NP       magnesium hydroxide (MILK OF MAGNESIA) suspension 30 mL  30 mL Oral Daily PRN [name], NP   30 mL at 04/13/23 0814   melatonin tablet 5 mg  5 mg Oral QHS Eternity Dexter, NP   5 mg at 05/16/23 2140   nicotine (NICODERM CQ - dosed in mg/24 hours) patch 21 mg  21 mg Transdermal Daily [name], DO   21 mg at 05/14/23 4098   nicotine polacrilex (NICORETTE) gum 2 mg  2 mg Oral PRN [name], MD   2 mg at 05/17/23 0837   paliperidone (INVEGA) 24 hr tablet 6 mg  6 mg Oral Daily [name], NP   6 mg at 05/16/23 2140   pantoprazole (PROTONIX) EC tablet 40 mg  40 mg Oral Daily [name], NP   40 mg at 05/17/23 0837   polyethylene glycol (MIRALAX / GLYCOLAX) packet 17 g  17 g Oral Daily PRN [name], NP   17 g at 04/13/23 1734   sertraline (ZOLOFT) tablet 150 mg  150 mg Oral Daily Massengill, Harrold Donath, MD   150 mg at 05/17/23 1191   SUMAtriptan (IMITREX) tablet 25 mg  25 mg Oral BID PRN [name], NP   25 mg at 05/07/23 2112    traZODone (DESYREL) tablet 50 mg  50 mg Oral QHS [name], NP   50 mg at 05/16/23 2140   Vitamin D (Ergocalciferol) (DRISDOL) 1.25 MG (50000 UNIT) capsule 50,000 Units  50,000 Units Oral Q7 days [name], NP   50,000 Units at 05/11/23 1431   Lab Results:  No results found for this or any previous visit (from the past 48 hour(s)).     Blood Alcohol level:  Lab Results  Component Value Date   ETH <10 12/19/2022   ETH <10 11/21/2019   Metabolic Disorder Labs: Lab Results  Component Value Date   HGBA1C 4.4 (L) 12/21/2022   MPG 79.58 12/21/2022   No results found for: "PROLACTIN" Lab Results  Component Value Date   CHOL 218 (H) 12/21/2022   TRIG 81 12/21/2022   HDL 53 12/21/2022   CHOLHDL 4.1 12/21/2022   VLDL 16 12/21/2022   LDLCALC 149 (H) 12/21/2022   LDLCALC 110 (H) 03/13/2011   Physical Findings: AIMS: Facial and Oral Movements Muscles of Facial Expression: None Lips and Perioral Area: None Jaw: None Tongue: None,Extremity Movements Upper (arms, wrists, hands, fingers): None Lower (legs, knees, ankles, toes): None, Trunk Movements Neck, shoulders, hips: None, Global Judgements Severity of abnormal movements overall : None Incapacitation due to abnormal movements: None Patient's awareness of abnormal movements: No Awareness, Dental Status Current problems with teeth and/or dentures?: No Does patient usually wear dentures?: No  CIWA:    COWS:    AIMS:0 Musculoskeletal: Strength & Muscle Tone: within normal limits Gait & Station: normal Patient leans: N/A  Psychiatric Specialty Exam:  Presentation  General Appearance:  Fairly Groomed  Eye Contact: Limited to baseline Speech: Decreased amount, decreased tone and volume but at baseline Speech Volume: Normal  Handedness: Right  Mood and Affect  Mood: Sad and depressed mainly secondary to being in the hospital  with no place to go Affect: Congruent  Thought Process  Thought  Processes: Coherent  Descriptions of Associations:Intact  Orientation:Full (Time, Place and Person)  Thought Content:Logical [place]crete History of Schizophrenia/Schizoaffective disorder:Yes  Duration of Psychotic Symptoms:Greater than six months  Hallucinations:Hallucinations: None Description of Auditory Hallucinations: Denies     Ideas of Reference: None  Suicidal Thoughts:Suicidal Thoughts: No SI Active Intent and/or Plan: -- (Denies) SI Passive Intent and/or Plan: -- (Denies)     Homicidal Thoughts:Homicidal Thoughts: No    Sensorium  Memory: Immediate Fair  Judgment: Fair  Insight: Fair  Art therapist  Concentration: Fair  Attention Span: Fair  Recall: Poor  Fund of Knowledge: Poor  Language: Fair  Psychomotor Activity  Psychomotor Activity: Psychomotor Activity: Normal     Assets  Assets: Resilience  Sleep  Sleep: Sleep: Good Number of Hours of Sleep: 9     Physical Exam: Physical Exam Vitals and nursing note reviewed.  HENT:     Nose: Nose normal.     Mouth/Throat:     Pharynx: Oropharynx is clear.  Eyes:     Pupils: Pupils are equal, round, and reactive to light.  Cardiovascular:     Rate and Rhythm: Normal rate.     Pulses: Normal pulses.  Pulmonary:     Effort: Pulmonary effort is normal.  Genitourinary:    Comments: Deferred Musculoskeletal:        General: Normal range of motion.     Cervical back: Normal range of motion.  Skin:    General: Skin is warm and dry.  Neurological:     General: No focal deficit present.     Mental Status: She is alert and oriented to person, place, and time.  Psychiatric:        Mood and Affect: Mood normal.        Behavior: Behavior normal.        Thought Content: Thought content normal.    Review of Systems  Constitutional:  Negative for chills, diaphoresis and fever.  HENT:  Negative for congestion, hearing loss and sore throat.   Eyes:  Negative for blurred  vision.  Respiratory:  Negative for cough, shortness of breath and wheezing.   Cardiovascular:  Negative for chest pain and palpitations.  Gastrointestinal:  Negative for abdominal pain, constipation, diarrhea, heartburn, nausea and vomiting.  Genitourinary:  Negative for dysuria.  Musculoskeletal:  Negative for joint pain and myalgias.  Skin:  Negative for rash.  Neurological:  Negative for dizziness.  Psychiatric/Behavioral:  Positive for depression. Negative for hallucinations, memory loss, substance abuse and suicidal ideas. The patient is nervous/anxious and has insomnia.   All other systems reviewed and are negative.  Blood pressure 116/70, pulse 95, temperature 97.6 F (36.4 C), temperature source Oral, resp. rate 16, height 4\' 11"  (1.499 m), weight 63 kg, SpO2 99%. Body mass index is 28.05 kg/m.  Treatment Plan Summary: Daily contact with patient to assess and evaluate symptoms and progress in treatment and Medication management.    Still waiting on safe disposition as patient would not be able to live independently given cognitive impairment. Recommend assisted living.    No medication side effects reported.  Because of urinary incontinence, recommended adult diapers to be worn.   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  Plan:  -Continue Imitrex 25 mg PRN BID for migraines -Continue Colace  100 mg daily for constipation -Continue Vitamin D 50.000 units weekly for bone health. -Continue Sertraline150 mg po Q daily for depression/anxiety -Continue Buspar 15 mg po bid for anxiety.  -Continue paliperidone 6 mg po qd for mood stabilization -Continue Trazodone 50 mg nightly for insomnia  -Continue Nicoderm 21 mg topically Q 24 hrs for nicotine  withdrawal management. -Continue vitamin B12 1000 mcg IM weekly for 4 weeks then monthly -Continue Melatonin 5 mg nightly for sleep -Continue Protonix EC 40 mg p.o. daily for GERD -Continue MiraLAX 17 g p.o. PRN for constipation -Continue hydroxyzine 25 mg p.o. 3 times daily as needed for anxiety -Continue Ensure nutritional shakes TID in between meals  -Previously discontinued Hydroxyzine 50 mg nightly-hypotension in the mornings   Safety and Monitoring: Voluntary admission to inpatient psychiatric unit for safety, stabilization and treatment Daily contact with patient to assess and evaluate symptoms and progress in treatment Patient's case to be discussed in multi-disciplinary team meeting Observation Level : q15 minute checks Vital signs: q12 hours Precautions: Safety   Discharge Planning: Social work and case management to assist with discharge planning and identification of hospital follow-up needs prior to discharge Estimated LOS: Unknown at this time. Discharge Concerns: Need to establish a safety plan; Medication compliance and effectiveness Discharge Goals: Return home with outpatient referrals for mental health follow-up including medication management/psychotherapy No legal guardian, has payee  [name], NP,  05/17/2023, 2:44 PM Patient ID: Yolanda [name], female   DOB: 1976/05/04, 47 y.o.   MRN: [id number]

## 2023-05-17 NOTE — Progress Notes (Signed)
   05/16/23 2140  Psych Admission Type (Psych Patients Only)  Admission Status Involuntary  Psychosocial Assessment  Patient Complaints Crying spells;Sadness  Eye Contact Fair  Facial Expression Anxious  Affect Sad  Speech Logical/coherent  Interaction Childlike;Assertive  Motor Activity Slow  Appearance/Hygiene Disheveled  Behavior Characteristics Cooperative  Mood Anxious;Sad  Thought Process  Coherency WDL  Content WDL  Delusions None reported or observed  Perception WDL  Hallucination None reported or observed  Judgment Impaired  Confusion None  Danger to Self  Current suicidal ideation? Denies  Agreement Not to Harm Self Yes  Description of Agreement verbal  Danger to Others  Danger to Others None reported or observed

## 2023-05-17 NOTE — Progress Notes (Signed)
   05/17/23 0557  15 Minute Checks  Location Bedroom  Visual Appearance Calm  Behavior Sleeping  Sleep (Behavioral Health Patients Only)  Calculate sleep? (Click Yes once per 24 hr at 0600 safety check) Yes  Documented sleep last 24 hours 9

## 2023-05-17 NOTE — Progress Notes (Signed)
   05/17/23 2300  Psych Admission Type (Psych Patients Only)  Admission Status Involuntary  Psychosocial Assessment  Patient Complaints Anxiety;Depression;Crying spells  Eye Contact Fair  Facial Expression Anxious;Flat  Affect Anxious;Depressed  Speech Logical/coherent  Interaction Childlike;Assertive  Motor Activity Slow  Appearance/Hygiene Disheveled  Behavior Characteristics Cooperative  Mood Depressed;Anxious;Sad  Thought Process  Coherency WDL  Content WDL  Delusions None reported or observed  Perception WDL  Hallucination None reported or observed  Judgment Impaired  Confusion None  Danger to Self  Current suicidal ideation? Denies  Agreement Not to Harm Self Yes  Description of Agreement verbal  Danger to Others  Danger to Others None reported or observed

## 2023-05-17 NOTE — Plan of Care (Signed)
  Problem: Coping: Goal: Will verbalize feelings Outcome: Progressing   Problem: Medication: Goal: Compliance with prescribed medication regimen will improve Outcome: Progressing   Problem: Safety: Goal: Periods of time without injury will increase Outcome: Progressing

## 2023-05-18 DIAGNOSIS — F251 Schizoaffective disorder, depressive type: Secondary | ICD-10-CM | POA: Diagnosis not present

## 2023-05-18 NOTE — [facility] Group Notes (Signed)
Adult Psychoeducational Group Note  Date:  05/18/2023 Time:  2:59 PM  Group Topic/Focus:  Building Self Esteem:   The Focus of this group is helping patients become aware of the effects of self-esteem on their lives, the things they and others do that enhance or undermine their self-esteem, seeing the relationship between their level of self-esteem and the choices they make and learning ways to enhance self-esteem.  Participation Level:  Did Not Attend  Participation Quality:  na  Affect:  na  Cognitive:  na  Insight: na  Engagement in Group:  na  Modes of Intervention:  na  Additional Comments:  Pt did not attend rgoup  [employer] 05/18/2023, 2:59 PM

## 2023-05-18 NOTE — [facility] Group Notes (Signed)
Adult Psychoeducational Group Note  Date:  05/18/2023 Time:  11:00 AM  Group Topic/Focus:  Emotional Education:   The focus of this group is to discuss what feelings/emotions are, and how they are experienced. Goals Group:   The focus of this group is to help patients establish daily goals to achieve during treatment and discuss how the patient can incorporate goal setting into their daily lives to aide in recovery.  Participation Level:  Did Not Attend  Participation Quality:  na  Affect:  na  Cognitive:  na  Insight: na  Engagement in Group:  na  Modes of Intervention:  na  Additional Comments:  Pt did not attend group  Luis Nickles 05/18/2023, 11:00 AM

## 2023-05-18 NOTE — Progress Notes (Signed)
[facility] MD Progress Note  05/18/2023 12:55 PM Yolanda [name]  MRN:  [id number] Subjective:  Yolanda [name] was seen this morning and again around lunch time. She was not feeling well this morning and had some pain in her left hip. She is not having pain now, but still feels sad about it. She didn't want to go down to the cafeteria for lunch today. She slept okay and is otherwise eating okay. She denies current hallucinations, thoughts of harm to self or others.   Principal Problem: Schizoaffective disorder, depressive type (HCC) Diagnosis: Principal Problem:   Schizoaffective disorder, depressive type (HCC) Active Problems:   GERD (gastroesophageal reflux disease)   Intellectual disability   Tobacco use disorder  Total Time spent with patient: 20 minutes  Past Psychiatric History: see H&P  Past Medical History:  Past Medical History:  Diagnosis Date   Bipolar affect, depressed (HCC)    Constipation 08/17/2022   Depression    Falls 07/21/2022   Fracture of femoral neck, right, closed (HCC) 01/17/2022   Herpes simplex 08/22/2017   Open fracture dislocation of right elbow joint 01/17/2022    Past Surgical History:  Procedure Laterality Date   NO PAST SURGERIES     SALPINGECTOMY     Family History: History reviewed. No pertinent family history. Family Psychiatric  History: see H&P Social History:  Social History   Substance and Sexual Activity  Alcohol Use Yes     Social History   Substance and Sexual Activity  Drug Use Yes   Types: Cocaine, Marijuana    Social History   Socioeconomic History   Marital status: Single    Spouse name: Not on file   Number of children: Not on file   Years of education: Not on file   Highest education level: Not on file  Occupational History   Not on file  Tobacco Use   Smoking status: Every Day   Smokeless tobacco: Not on file  Substance and Sexual Activity   Alcohol use: Yes   Drug use: Yes    Types: Cocaine, Marijuana   Sexual  activity: Yes  Other Topics Concern   Not on file  Social History Narrative   Not on file   Social Determinants of Health   Financial Resource Strain: Low Risk  (09/12/2022)   Received from [facility], [facility]   Overall Financial Resource Strain (CARDIA)    Difficulty of Paying Living Expenses: Not hard at all  Food Insecurity: Patient Declined (12/20/2022)   Hunger Vital Sign    Worried About Running Out of Food in the Last Year: Patient declined    Ran Out of Food in the Last Year: Patient declined  Transportation Needs: No Transportation Needs (12/20/2022)   PRAPARE - Administrator, Civil Service (Medical): No    Lack of Transportation (Non-Medical): No  Physical Activity: Not on file  Stress: No Stress Concern Present (07/17/2022)   Received from [facility], [facility] - Occupational Stress Questionnaire    Feeling of Stress : Not at all  Social Connections: Unknown (07/16/2022)   Received from [facility], [facility]   Social Network    Social Network: Not on file   Additional Social History:                         Sleep: Fair  Appetite:  Fair  Current Medications: Current Facility-Administered Medications  Medication Dose Route  Frequency Provider Last Rate Last Admin   acetaminophen (TYLENOL) tablet 650 mg  650 mg Oral Q6H PRN [name], NP   650 mg at 05/17/23 2126   alum & mag hydroxide-simeth (MAALOX/MYLANTA) 200-200-20 MG/5ML suspension 30 mL  30 mL Oral Q4H PRN [name], NP   30 mL at 01/28/23 1530   busPIRone (BUSPAR) tablet 15 mg  15 mg Oral BID [name], NP   15 mg at 05/17/23 1610   cyanocobalamin (VITAMIN B12) injection 1,000 mcg  1,000 mcg Intramuscular Q30 days Sarita Bottom, MD   1,000 mcg at 05/03/23 1257   diphenhydrAMINE (BENADRYL) capsule 50 mg  50 mg Oral TID PRN [name], NP   50 mg at 01/15/23 1211   Or   diphenhydrAMINE (BENADRYL) injection 50 mg   50 mg Intramuscular TID PRN [name], NP       docusate sodium (COLACE) capsule 100 mg  100 mg Oral Daily Nkwenti, Tyler Aas, NP   100 mg at 05/17/23 0837   feeding supplement (ENSURE ENLIVE / ENSURE PLUS) liquid 237 mL  237 mL Oral TID BM Nkwenti, Tyler Aas, NP   237 mL at 05/17/23 2122   haloperidol (HALDOL) tablet 5 mg  5 mg Oral TID PRN [name], NP   5 mg at 05/04/23 9604   Or   haloperidol lactate (HALDOL) injection 5 mg  5 mg Intramuscular TID PRN [name], NP       hydrOXYzine (ATARAX) tablet 25 mg  25 mg Oral TID PRN [name], DO   25 mg at 05/17/23 2123   LORazepam (ATIVAN) tablet 2 mg  2 mg Oral TID PRN [name], NP   2 mg at 01/15/23 1211   Or   LORazepam (ATIVAN) injection 2 mg  2 mg Intramuscular TID PRN [name], NP       magnesium hydroxide (MILK OF MAGNESIA) suspension 30 mL  30 mL Oral Daily PRN [name], NP   30 mL at 04/13/23 0814   melatonin tablet 5 mg  5 mg Oral QHS Nkwenti, Doris, NP   5 mg at 05/17/23 2123   nicotine (NICODERM CQ - dosed in mg/24 hours) patch 21 mg  21 mg Transdermal Daily [name], DO   21 mg at 05/14/23 5409   nicotine polacrilex (NICORETTE) gum 2 mg  2 mg Oral PRN [name], MD   2 mg at 05/17/23 0837   paliperidone (INVEGA) 24 hr tablet 6 mg  6 mg Oral Daily [name], NP   6 mg at 05/17/23 2123   pantoprazole (PROTONIX) EC tablet 40 mg  40 mg Oral Daily [name], NP   40 mg at 05/17/23 0837   polyethylene glycol (MIRALAX / GLYCOLAX) packet 17 g  17 g Oral Daily PRN [name], NP   17 g at 04/13/23 1734   sertraline (ZOLOFT) tablet 150 mg  150 mg Oral Daily Massengill, Harrold Donath, MD   150 mg at 05/17/23 0837   SUMAtriptan (IMITREX) tablet 25 mg  25 mg Oral BID PRN [name], NP   25 mg at 05/07/23 2112   traZODone (DESYREL) tablet 50 mg  50 mg Oral QHS [name], NP   50 mg at 05/17/23 2123   Vitamin D (Ergocalciferol) (DRISDOL) 1.25 MG (50000 UNIT) capsule 50,000 Units  50,000 Units Oral  Q7 days [name], NP   50,000 Units at 05/11/23 1431    Lab Results: No results found for this or  any previous visit (from the past 48 hour(s)).  Blood Alcohol level:  Lab Results  Component Value Date   ETH <10 12/19/2022   ETH <10 11/21/2019    Metabolic Disorder Labs: Lab Results  Component Value Date   HGBA1C 4.4 (L) 12/21/2022   MPG 79.58 12/21/2022   No results found for: "PROLACTIN" Lab Results  Component Value Date   CHOL 218 (H) 12/21/2022   TRIG 81 12/21/2022   HDL 53 12/21/2022   CHOLHDL 4.1 12/21/2022   VLDL 16 12/21/2022   LDLCALC 149 (H) 12/21/2022   LDLCALC 110 (H) 03/13/2011    Physical Findings: AIMS: Facial and Oral Movements Muscles of Facial Expression: None Lips and Perioral Area: None Jaw: None Tongue: None,Extremity Movements Upper (arms, wrists, hands, fingers): None Lower (legs, knees, ankles, toes): None, Trunk Movements Neck, shoulders, hips: None, Global Judgements Severity of abnormal movements overall : None Incapacitation due to abnormal movements: None Patient's awareness of abnormal movements: No Awareness, Dental Status Current problems with teeth and/or dentures?: No Does patient usually wear dentures?: No  CIWA:    COWS:     Musculoskeletal: Strength & Muscle Tone: within normal limits Gait & Station: normal Patient leans: N/A  Psychiatric Specialty Exam:  Presentation  General Appearance:  Casual  Eye Contact: Good  Speech: Normal Rate  Speech Volume: Normal  Handedness: Right   Mood and Affect  Mood: Depressed  Affect: Depressed   Thought Process  Thought Processes: Goal Directed (concrete)  Descriptions of Associations:Intact  Orientation:Partial  Thought Content:Rumination  History of Schizophrenia/Schizoaffective disorder:Yes  Duration of Psychotic Symptoms:Greater than six months  Hallucinations:Hallucinations: None  Ideas of Reference:None  Suicidal Thoughts:Suicidal  Thoughts: No  Homicidal Thoughts:Homicidal Thoughts: No   Sensorium  Memory: Immediate Poor; Recent Poor  Judgment: Poor  Insight: Poor   Executive Functions  Concentration: Fair  Attention Span: Poor  Recall: Poor  Fund of Knowledge: Poor  Language: Fair   Psychomotor Activity  Psychomotor Activity: Psychomotor Activity: Normal   Assets  Assets: Desire for Improvement   Sleep  Sleep: Sleep: Fair    Physical Exam: Physical Exam Vitals and nursing note reviewed.  HENT:     Head: Normocephalic and atraumatic.  Eyes:     Extraocular Movements: Extraocular movements intact.  Pulmonary:     Effort: Pulmonary effort is normal.  Musculoskeletal:        General: Normal range of motion.     Cervical back: Normal range of motion.  Neurological:     General: No focal deficit present.     Mental Status: She is alert.    Review of Systems  Constitutional:  Negative for fever.  Gastrointestinal:  Negative for nausea and vomiting.  Musculoskeletal:  Negative for myalgias.  Psychiatric/Behavioral:  Negative for hallucinations and suicidal ideas.    Blood pressure 104/72, pulse 78, temperature 97.6 F (36.4 C), temperature source Oral, resp. rate 16, height 4\' 11"  (1.499 m), weight 63 kg, SpO2 97%. Body mass index is 28.05 kg/m.   Treatment Plan Summary: Daily contact with patient to assess and evaluate symptoms and progress in treatment and Medication management.    Still waiting on safe disposition as patient would not be able to live independently given cognitive impairment. Recommend assisted living.    No medication side effects reported.  Because of urinary incontinence, recommended adult diapers to be worn.   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  Plan:  -Continue Imitrex 25 mg PRN BID for migraines -Continue Colace 100 mg daily for constipation -Continue Vitamin D 50.000 units weekly for bone health. -Continue Sertraline150 mg po Q daily for depression/anxiety -Continue Buspar 15 mg po bid for anxiety.  -Continue paliperidone 6 mg po qd for mood stabilization -Continue Trazodone 50 mg nightly for insomnia  -Continue Nicoderm 21 mg topically Q 24 hrs for nicotine withdrawal management. -Continue vitamin B12 1000 mcg IM weekly for 4 weeks then monthly -Continue Melatonin 5 mg nightly for sleep -Continue Protonix EC 40 mg p.o. daily for GERD -Continue MiraLAX 17 g p.o. PRN for constipation -Continue hydroxyzine 25 mg p.o. 3 times daily as needed for anxiety -Continue Ensure nutritional shakes TID in between meals  -Previously discontinued Hydroxyzine 50 mg nightly-hypotension in the mornings   Safety and Monitoring: Voluntary admission to inpatient psychiatric unit for safety, stabilization and treatment Daily contact with patient to assess and evaluate symptoms and progress in treatment Patient's case to be discussed in multi-disciplinary team meeting Observation Level : q15 minute checks Vital signs: q12 hours Precautions: Safety   Discharge Planning: Social work and case management to assist with discharge planning and identification of hospital follow-up needs prior to discharge Estimated LOS: Unknown at this time. Discharge Concerns: Need to establish a safety plan; Medication compliance and effectiveness Discharge Goals: Return home with outpatient referrals for mental health follow-up including medication management/psychotherapy No legal guardian, has payee  Roselle Locus, MD 05/18/2023, 12:55 PM

## 2023-05-18 NOTE — Progress Notes (Signed)
   05/18/23 2226  Psych Admission Type (Psych Patients Only)  Admission Status Involuntary  Psychosocial Assessment  Patient Complaints Depression  Eye Contact Fair  Facial Expression Animated  Affect Appropriate to circumstance  Speech Soft;Logical/coherent  Interaction Childlike  Motor Activity Slow  Appearance/Hygiene Disheveled  Behavior Characteristics Appropriate to situation  Mood Depressed;Pleasant  Thought Process  Coherency WDL  Content WDL  Delusions None reported or observed  Perception WDL  Hallucination None reported or observed  Judgment Impaired  Confusion None  Danger to Self  Current suicidal ideation? Denies  Self-Injurious Behavior No self-injurious ideation or behavior indicators observed or expressed   Agreement Not to Harm Self Yes  Description of Agreement verbal  Danger to Others  Danger to Others None reported or observed

## 2023-05-18 NOTE — Progress Notes (Signed)
Yolanda [name], In bed sleeping but responds to voice but refusing am meds. She wants to sleep. Denies SI/HI/AVH Intermittently tearful when awake. Pt and room smells of urine. Encouraged her to shower and again offered meds but refused and remains in bed. Vital signs stable

## 2023-05-18 NOTE — Plan of Care (Signed)
  Problem: Education: Goal: Utilization of techniques to improve thought processes will improve Outcome: Progressing Goal: Knowledge of the prescribed therapeutic regimen will improve Outcome: Progressing   

## 2023-05-19 DIAGNOSIS — F251 Schizoaffective disorder, depressive type: Secondary | ICD-10-CM | POA: Diagnosis not present

## 2023-05-19 LAB — URINALYSIS, ROUTINE W REFLEX MICROSCOPIC
Bacteria, UA: NONE SEEN
Bilirubin Urine: NEGATIVE
Glucose, UA: NEGATIVE mg/dL
Ketones, ur: NEGATIVE mg/dL
Nitrite: POSITIVE — AB
Protein, ur: NEGATIVE mg/dL
Specific Gravity, Urine: 1.017 (ref 1.005–1.030)
pH: 5 (ref 5.0–8.0)

## 2023-05-19 NOTE — [facility] Group Notes (Signed)
[facility] Group Notes:  (Nursing/MHT/Case Management/Adjunct)  Date:  05/19/2023  Time:  2:27 PM  Type of Therapy:  Psychoeducational Skills  Participation Level:  DID NOT ATTEND.  Participation Quality:  NA  Affect:  NA  Cognitive:  NA  Insight:  None  Engagement in Group:  NA  Modes of Intervention:  NA  Summary of Progress/Problems: did not attend psychoeducational RN GROUp.   Malva Limes 05/19/2023, 2:27 PM

## 2023-05-19 NOTE — Progress Notes (Signed)
[facility] MD Progress Note  05/19/2023 12:45 PM Yolanda [name]  MRN:  [id number] Subjective:  per report, the patient refused some of the medications yesterday. On interview today, she is tearful. She states that "I don't like it" when asked about issues with medications. Compliance encouraged. She is sleeping well. She has a decreased appetite today. No hallucinations. She is feeling like she wants to die, but has no plan and is able to contract for safety.   Principal Problem: Schizoaffective disorder, depressive type (HCC) Diagnosis: Principal Problem:   Schizoaffective disorder, depressive type (HCC) Active Problems:   GERD (gastroesophageal reflux disease)   Intellectual disability   Tobacco use disorder  Total Time spent with patient: 20 minutes  Past Psychiatric History: see H&P  Past Medical History:  Past Medical History:  Diagnosis Date   Bipolar affect, depressed (HCC)    Constipation 08/17/2022   Depression    Falls 07/21/2022   Fracture of femoral neck, right, closed (HCC) 01/17/2022   Herpes simplex 08/22/2017   Open fracture dislocation of right elbow joint 01/17/2022    Past Surgical History:  Procedure Laterality Date   NO PAST SURGERIES     SALPINGECTOMY     Family History: History reviewed. No pertinent family history. Family Psychiatric  History: see H&P Social History:  Social History   Substance and Sexual Activity  Alcohol Use Yes     Social History   Substance and Sexual Activity  Drug Use Yes   Types: Cocaine, Marijuana    Social History   Socioeconomic History   Marital status: Single    Spouse name: Not on file   Number of children: Not on file   Years of education: Not on file   Highest education level: Not on file  Occupational History   Not on file  Tobacco Use   Smoking status: Every Day   Smokeless tobacco: Not on file  Substance and Sexual Activity   Alcohol use: Yes   Drug use: Yes    Types: Cocaine, Marijuana   Sexual  activity: Yes  Other Topics Concern   Not on file  Social History Narrative   Not on file   Social Determinants of Health   Financial Resource Strain: Low Risk  (09/12/2022)   Received from [facility], [facility]   Overall Financial Resource Strain (CARDIA)    Difficulty of Paying Living Expenses: Not hard at all  Food Insecurity: Patient Declined (12/20/2022)   Hunger Vital Sign    Worried About Running Out of Food in the Last Year: Patient declined    Ran Out of Food in the Last Year: Patient declined  Transportation Needs: No Transportation Needs (12/20/2022)   PRAPARE - Administrator, Civil Service (Medical): No    Lack of Transportation (Non-Medical): No  Physical Activity: Not on file  Stress: No Stress Concern Present (07/17/2022)   Received from [facility], [facility] - Occupational Stress Questionnaire    Feeling of Stress : Not at all  Social Connections: Unknown (07/16/2022)   Received from [facility], [facility]   Social Network    Social Network: Not on file   Additional Social History:                         Sleep: Good  Appetite:  Poor  Current Medications: Current Facility-Administered Medications  Medication Dose Route Frequency Provider Last Rate Last Admin  acetaminophen (TYLENOL) tablet 650 mg  650 mg Oral Q6H PRN [name], NP   650 mg at 05/18/23 1530   alum & mag hydroxide-simeth (MAALOX/MYLANTA) 200-200-20 MG/5ML suspension 30 mL  30 mL Oral Q4H PRN [name], NP   30 mL at 01/28/23 1530   busPIRone (BUSPAR) tablet 15 mg  15 mg Oral BID [name], NP   15 mg at 05/19/23 1138   cyanocobalamin (VITAMIN B12) injection 1,000 mcg  1,000 mcg Intramuscular Q30 days Sarita Bottom, MD   1,000 mcg at 05/03/23 1257   diphenhydrAMINE (BENADRYL) capsule 50 mg  50 mg Oral TID PRN [name], NP   50 mg at 01/15/23 1211   Or   diphenhydrAMINE (BENADRYL) injection 50 mg   50 mg Intramuscular TID PRN [name], NP       docusate sodium (COLACE) capsule 100 mg  100 mg Oral Daily [name], NP   100 mg at 05/19/23 1137   feeding supplement (ENSURE ENLIVE / ENSURE PLUS) liquid 237 mL  237 mL Oral TID BM [name], NP   237 mL at 05/19/23 1138   haloperidol (HALDOL) tablet 5 mg  5 mg Oral TID PRN [name], NP   5 mg at 05/04/23 2130   Or   haloperidol lactate (HALDOL) injection 5 mg  5 mg Intramuscular TID PRN [name], NP       hydrOXYzine (ATARAX) tablet 25 mg  25 mg Oral TID PRN [name], DO   25 mg at 05/19/23 1218   LORazepam (ATIVAN) tablet 2 mg  2 mg Oral TID PRN [name], NP   2 mg at 01/15/23 1211   Or   LORazepam (ATIVAN) injection 2 mg  2 mg Intramuscular TID PRN [name], NP       magnesium hydroxide (MILK OF MAGNESIA) suspension 30 mL  30 mL Oral Daily PRN [name], NP   30 mL at 04/13/23 0814   melatonin tablet 5 mg  5 mg Oral QHS Nkwenti, Doris, NP   5 mg at 05/18/23 2116   nicotine (NICODERM CQ - dosed in mg/24 hours) patch 21 mg  21 mg Transdermal Daily [name], DO   21 mg at 05/14/23 8657   nicotine polacrilex (NICORETTE) gum 2 mg  2 mg Oral PRN [name], MD   2 mg at 05/19/23 1219   paliperidone (INVEGA) 24 hr tablet 6 mg  6 mg Oral Daily [name], NP   6 mg at 05/18/23 2116   pantoprazole (PROTONIX) EC tablet 40 mg  40 mg Oral Daily [name], NP   40 mg at 05/19/23 1137   polyethylene glycol (MIRALAX / GLYCOLAX) packet 17 g  17 g Oral Daily PRN [name], NP   17 g at 04/13/23 1734   sertraline (ZOLOFT) tablet 150 mg  150 mg Oral Daily Massengill, Harrold Donath, MD   150 mg at 05/19/23 1137   SUMAtriptan (IMITREX) tablet 25 mg  25 mg Oral BID PRN [name], NP   25 mg at 05/07/23 2112   traZODone (DESYREL) tablet 50 mg  50 mg Oral QHS [name], NP   50 mg at 05/18/23 2116   Vitamin D (Ergocalciferol) (DRISDOL) 1.25 MG (50000 UNIT) capsule 50,000 Units  50,000 Units Oral  Q7 days [name], NP   50,000 Units at 05/11/23 1431    Lab Results: No results found for this or any previous visit (from the past 48 hour(s)).  Blood Alcohol level:  Lab Results  Component Value Date   ETH <10 12/19/2022   ETH <10 11/21/2019    Metabolic Disorder Labs: Lab Results  Component Value Date   HGBA1C 4.4 (L) 12/21/2022   MPG 79.58 12/21/2022   No results found for: "PROLACTIN" Lab Results  Component Value Date   CHOL 218 (H) 12/21/2022   TRIG 81 12/21/2022   HDL 53 12/21/2022   CHOLHDL 4.1 12/21/2022   VLDL 16 12/21/2022   LDLCALC 149 (H) 12/21/2022   LDLCALC 110 (H) 03/13/2011    Physical Findings: AIMS: Facial and Oral Movements Muscles of Facial Expression: None Lips and Perioral Area: None Jaw: None Tongue: None,Extremity Movements Upper (arms, wrists, hands, fingers): None Lower (legs, knees, ankles, toes): None, Trunk Movements Neck, shoulders, hips: None, Global Judgements Severity of abnormal movements overall : None Incapacitation due to abnormal movements: None Patient's awareness of abnormal movements: No Awareness, Dental Status Current problems with teeth and/or dentures?: No Does patient usually wear dentures?: No  CIWA:    COWS:     Musculoskeletal: Strength & Muscle Tone: within normal limits Gait & Station: normal Patient leans: N/A  Psychiatric Specialty Exam:  Presentation  General Appearance:  Fairly Groomed  Eye Contact: Fair  Speech: Slow  Speech Volume: Decreased  Handedness: Right   Mood and Affect  Mood: Dysphoric  Affect: Tearful   Thought Process  Thought Processes: Coherent  Descriptions of Associations:Intact  Orientation:Partial  Thought Content:Rumination  History of Schizophrenia/Schizoaffective disorder:Yes  Duration of Psychotic Symptoms:Greater than six months  Hallucinations:Hallucinations: None  Ideas of Reference:None  Suicidal Thoughts:Suicidal Thoughts: Yes,  Passive SI Passive Intent and/or Plan: Without Intent; Without Plan  Homicidal Thoughts:Homicidal Thoughts: No   Sensorium  Memory: Immediate Poor; Recent Poor  Judgment: Poor  Insight: Poor   Executive Functions  Concentration: Poor  Attention Span: Fair  Recall: Poor  Fund of Knowledge: Poor  Language: Fair   Psychomotor Activity  Psychomotor Activity: Psychomotor Activity: Normal   Assets  Assets: Leisure Time   Sleep  Sleep: Sleep: Fair    Physical Exam: Physical Exam Constitutional:      Appearance: Normal appearance.  HENT:     Head: Normocephalic and atraumatic.  Eyes:     Extraocular Movements: Extraocular movements intact.  Pulmonary:     Effort: Pulmonary effort is normal.  Musculoskeletal:        General: Normal range of motion.     Cervical back: Normal range of motion.  Neurological:     General: No focal deficit present.     Mental Status: She is alert.  Psychiatric:        Mood and Affect: Mood is depressed. Affect is tearful.    Review of Systems  Gastrointestinal:  Negative for constipation, diarrhea, nausea and vomiting.  Musculoskeletal:  Negative for myalgias.  Psychiatric/Behavioral:  Positive for suicidal ideas.    Blood pressure (!) 86/53, pulse 68, temperature 97.6 F (36.4 C), temperature source Oral, resp. rate 16, height 4\' 11"  (1.499 m), weight 63 kg, SpO2 97%. Body mass index is 28.05 kg/m.   Treatment Plan Summary: Daily contact with patient to assess and evaluate symptoms and progress in treatment and Medication management.    Still waiting on safe disposition as patient would not be able to live independently given cognitive impairment. Recommend assisted living.    No medication side effects reported.  Because of urinary incontinence, recommended adult diapers to be worn.   Principal/active diagnoses:  Schizoaffective disorder,  depressive type [facility])   Active Problems: GERD (gastroesophageal  reflux disease) Intellectual disability Tobacco use disorder (MOCA 16/30) moderate cognitive impairment probably related to moderate intellectual disability.  Plan:  -Continue Imitrex 25 mg PRN BID for migraines -Continue Colace 100 mg daily for constipation -Continue Vitamin D 50.000 units weekly for bone health. -Continue Sertraline150 mg po Q daily for depression/anxiety -Continue Buspar 15 mg po bid for anxiety.  -Continue paliperidone 6 mg po qd for mood stabilization -Continue Trazodone 50 mg nightly for insomnia  -Continue Nicoderm 21 mg topically Q 24 hrs for nicotine withdrawal management. -Continue vitamin B12 1000 mcg IM weekly for 4 weeks then monthly -Continue Melatonin 5 mg nightly for sleep -Continue Protonix EC 40 mg p.o. daily for GERD -Continue MiraLAX 17 g p.o. PRN for constipation -Continue hydroxyzine 25 mg p.o. 3 times daily as needed for anxiety -Continue Ensure nutritional shakes TID in between meals  -Previously discontinued Hydroxyzine 50 mg nightly-hypotension in the mornings   Safety and Monitoring: Voluntary admission to inpatient psychiatric unit for safety, stabilization and treatment Daily contact with patient to assess and evaluate symptoms and progress in treatment Patient's case to be discussed in multi-disciplinary team meeting Observation Level : q15 minute checks Vital signs: q12 hours Precautions: Safety   Discharge Planning: Social work and case management to assist with discharge planning and identification of hospital follow-up needs prior to discharge Estimated LOS: Unknown at this time. Discharge Concerns: Need to establish a safety plan; Medication compliance and effectiveness Discharge Goals: Return home with outpatient referrals for mental health follow-up including medication management/psychotherapy No legal guardian, has payee  Roselle Locus, MD 05/19/2023, 12:45 PM

## 2023-05-19 NOTE — Plan of Care (Signed)
  Problem: Coping: Goal: Coping ability will improve Outcome: Progressing   

## 2023-05-19 NOTE — [facility] Group Notes (Signed)
LCSW Wellness Group Note   05/19/2023 10:45am  Type of Group and Topic: Psychoeducational Group:  Wellness  Participation Level:  did not attend.    Description of Group  Wellness group introduces the topic and its focus on developing healthy habits across the spectrum and its relationship to a decrease in hospital admissions.  Six areas of wellness are discussed: physical, social spiritual, intellectual, occupational, and emotional.  Patients are asked to consider their current wellness habits and to identify areas of wellness where they are interested and able to focus on improvements.    Therapeutic Goals Patients will understand components of wellness and how they can positively impact overall health.  Patients will identify areas of wellness where they have developed good habits. Patients will identify areas of wellness where they would like to make improvements.    Summary of Patient Progress     Therapeutic Modalities: Cognitive Behavioral Therapy Psychoeducation    Lorri Frederick, LCSW

## 2023-05-19 NOTE — [facility] Group Notes (Signed)
[facility] Group Notes:  (Nursing/MHT/Case Management/Adjunct)  Date:  05/19/2023  Time:  9:19 PM  Type of Therapy:   Wrap-up group  Participation Level:  Did Not Attend  Participation Quality:    Affect:    Cognitive:    Insight:    Engagement in Group:    Modes of Intervention:    Summary of Progress/Problems: Pt didn't attend group. Writer provided pt. With worksheet on positive thinking.  Noah Delaine 05/19/2023, 9:19 PM

## 2023-05-19 NOTE — Progress Notes (Signed)
   05/19/23 0530  15 Minute Checks  Location Bedroom  Visual Appearance Calm  Behavior Sleeping  Sleep (Behavioral Health Patients Only)  Calculate sleep? (Click Yes once per 24 hr at 0600 safety check) Yes  Documented sleep last 24 hours 9.5

## 2023-05-19 NOTE — Progress Notes (Signed)
   05/19/23 2220  Psych Admission Type (Psych Patients Only)  Admission Status Involuntary  Psychosocial Assessment  Patient Complaints Anxiety;Depression  Eye Contact Fair  Facial Expression Animated  Affect Appropriate to circumstance  Speech Soft  Interaction Childlike  Motor Activity Slow  Appearance/Hygiene Disheveled  Behavior Characteristics Appropriate to situation  Mood Depressed;Pleasant  Thought Process  Coherency WDL  Content WDL  Delusions None reported or observed  Perception WDL  Hallucination None reported or observed  Judgment Limited  Confusion None  Danger to Self  Current suicidal ideation? Denies  Self-Injurious Behavior No self-injurious ideation or behavior indicators observed or expressed   Agreement Not to Harm Self Yes  Description of Agreement verbal  Danger to Others  Danger to Others None reported or observed

## 2023-05-19 NOTE — Plan of Care (Signed)
  Problem: Coping: Goal: Coping ability will improve Outcome: Progressing   Problem: Safety: Goal: Ability to disclose and discuss suicidal ideas will improve Outcome: Progressing   Problem: Activity: Goal: Interest or engagement in leisure activities will improve Outcome: Not Progressing

## 2023-05-19 NOTE — Progress Notes (Signed)
   05/19/23 1140  Psych Admission Type (Psych Patients Only)  Admission Status Involuntary  Psychosocial Assessment  Patient Complaints Anxiety;Crying spells;Depression  Eye Contact Fair  Facial Expression Animated  Affect Appropriate to circumstance  Speech Logical/coherent  Interaction Childlike  Motor Activity Slow  Appearance/Hygiene Disheveled  Behavior Characteristics Appropriate to situation  Mood Depressed;Pleasant  Thought Process  Coherency WDL  Content WDL  Delusions None reported or observed  Perception WDL  Hallucination None reported or observed  Judgment Impaired  Confusion None  Danger to Self  Current suicidal ideation? Denies  Self-Injurious Behavior No self-injurious ideation or behavior indicators observed or expressed   Agreement Not to Harm Self Yes  Description of Agreement Verbal  Danger to Others  Danger to Others None reported or observed

## 2023-05-20 ENCOUNTER — Encounter (HOSPITAL_COMMUNITY): Payer: Self-pay

## 2023-05-20 NOTE — Plan of Care (Signed)
  Problem: Coping: Goal: Coping ability will improve Outcome: Progressing   Problem: Coping: Goal: Will verbalize feelings Outcome: Progressing   

## 2023-05-20 NOTE — Progress Notes (Signed)
   05/20/23 0822  Psych Admission Type (Psych Patients Only)  Admission Status Involuntary  Psychosocial Assessment  Patient Complaints Anxiety;Depression  Eye Contact Fair  Facial Expression Animated  Affect Appropriate to circumstance  Speech Soft  Interaction Childlike  Motor Activity Slow  Appearance/Hygiene Disheveled  Behavior Characteristics Appropriate to situation  Mood Depressed;Pleasant  Thought Process  Coherency WDL  Content WDL  Delusions None reported or observed  Perception WDL  Hallucination None reported or observed  Judgment Limited  Confusion None  Danger to Self  Current suicidal ideation? Denies  Self-Injurious Behavior No self-injurious ideation or behavior indicators observed or expressed   Agreement Not to Harm Self Yes  Description of Agreement Verbal  Danger to Others  Danger to Others None reported or observed

## 2023-05-20 NOTE — Plan of Care (Signed)
  Problem: Activity: Goal: Interest or engagement in leisure activities will improve Outcome: Progressing   Problem: Coping: Goal: Coping ability will improve Outcome: Progressing   Problem: Self-Concept: Goal: Level of anxiety will decrease Outcome: Progressing

## 2023-05-20 NOTE — Progress Notes (Signed)
[facility] MD Progress Note  05/20/2023 11:50 AM Yolanda [name]  MRN:  [id number] Subjective:  per report, patient was compliant with medication overnight.  She was reportedly tearful.  On interview today, patient is calm and cooperative.  She states that she feels "okay".  She tells me that she has 2 daughters in [place].  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  Reported taking special classes.  She denies suicidal or homicidal ideation today.  She denies auditory or visual hallucinations.  Principal Problem: Schizoaffective disorder, depressive type (HCC) Diagnosis: Principal Problem:   Schizoaffective disorder, depressive type (HCC) Active Problems:   GERD (gastroesophageal reflux disease)   Intellectual disability   Tobacco use disorder  Total Time spent with patient: 20 minutes  Past Psychiatric History: see H&P  Past Medical History:  Past Medical History:  Diagnosis Date   Bipolar affect, depressed (HCC)    Constipation 08/17/2022   Depression    Falls 07/21/2022   Fracture of femoral neck, right, closed (HCC) 01/17/2022   Herpes simplex 08/22/2017   Open fracture dislocation of right elbow joint 01/17/2022    Past Surgical History:  Procedure Laterality Date   NO PAST SURGERIES     SALPINGECTOMY     Family History: History reviewed. No pertinent family history. Family Psychiatric  History: see H&P Social History:  Social History   Substance and Sexual Activity  Alcohol Use Yes     Social History   Substance and Sexual Activity  Drug Use Yes   Types: Cocaine, Marijuana    Social History   Socioeconomic History   Marital status: Single    Spouse name: Not on file   Number of children: Not on file   Years of education: Not on file   Highest education level: Not on file  Occupational History   Not on file  Tobacco Use   Smoking status:  Every Day   Smokeless tobacco: Not on file  Substance and Sexual Activity   Alcohol use: Yes   Drug use: Yes    Types: Cocaine, Marijuana   Sexual activity: Yes  Other Topics Concern   Not on file  Social History Narrative   Not on file   Social Determinants of Health   Financial Resource Strain: Low Risk  (09/12/2022)   Received from [facility], [facility]   Overall Financial Resource Strain (CARDIA)    Difficulty of Paying Living Expenses: Not hard at all  Food Insecurity: Patient Declined (12/20/2022)   Hunger Vital Sign    Worried About Running Out of Food in the Last Year: Patient declined    Ran Out of Food in the Last Year: Patient declined  Transportation Needs: No Transportation Needs (12/20/2022)   PRAPARE - Administrator, Civil Service (Medical): No    Lack of Transportation (Non-Medical): No  Physical Activity: Not on file  Stress: No Stress Concern Present (07/17/2022)   Received from [facility], [facility] - Occupational Stress Questionnaire    Feeling of Stress : Not at all  Social Connections: Unknown (07/16/2022)   Received from [facility], [facility]   Social Network    Social Network: Not on file   Additional Social History:  Sleep: Good  Appetite:  Poor  Current Medications: Current Facility-Administered Medications  Medication Dose Route Frequency Provider Last Rate Last Admin   acetaminophen (TYLENOL) tablet 650 mg  650 mg Oral Q6H PRN [name], NP   650 mg at 05/18/23 1530   alum & mag hydroxide-simeth (MAALOX/MYLANTA) 200-200-20 MG/5ML suspension 30 mL  30 mL Oral Q4H PRN [name], NP   30 mL at 01/28/23 1530   busPIRone (BUSPAR) tablet 15 mg  15 mg Oral BID [name], NP   15 mg at 05/20/23 0960   cyanocobalamin (VITAMIN B12) injection 1,000 mcg  1,000 mcg Intramuscular Q30 days Sarita Bottom, MD   1,000 mcg at 05/03/23 1257    diphenhydrAMINE (BENADRYL) capsule 50 mg  50 mg Oral TID PRN [name], NP   50 mg at 01/15/23 1211   Or   diphenhydrAMINE (BENADRYL) injection 50 mg  50 mg Intramuscular TID PRN [name], NP       docusate sodium (COLACE) capsule 100 mg  100 mg Oral Daily [name], NP   100 mg at 05/20/23 4540   feeding supplement (ENSURE ENLIVE / ENSURE PLUS) liquid 237 mL  237 mL Oral TID BM [name], NP   237 mL at 05/20/23 0953   haloperidol (HALDOL) tablet 5 mg  5 mg Oral TID PRN [name], NP   5 mg at 05/04/23 9811   Or   haloperidol lactate (HALDOL) injection 5 mg  5 mg Intramuscular TID PRN [name], NP       hydrOXYzine (ATARAX) tablet 25 mg  25 mg Oral TID PRN [name], DO   25 mg at 05/19/23 1218   LORazepam (ATIVAN) tablet 2 mg  2 mg Oral TID PRN [name], NP   2 mg at 01/15/23 1211   Or   LORazepam (ATIVAN) injection 2 mg  2 mg Intramuscular TID PRN [name], NP       magnesium hydroxide (MILK OF MAGNESIA) suspension 30 mL  30 mL Oral Daily PRN [name], NP   30 mL at 04/13/23 0814   melatonin tablet 5 mg  5 mg Oral QHS Nkwenti, Doris, NP   5 mg at 05/19/23 2127   nicotine (NICODERM CQ - dosed in mg/24 hours) patch 21 mg  21 mg Transdermal Daily [name], DO   21 mg at 05/14/23 9147   nicotine polacrilex (NICORETTE) gum 2 mg  2 mg Oral PRN [name], MD   2 mg at 05/19/23 1219   paliperidone (INVEGA) 24 hr tablet 6 mg  6 mg Oral Daily [name], NP   6 mg at 05/19/23 2127   pantoprazole (PROTONIX) EC tablet 40 mg  40 mg Oral Daily [name], NP   40 mg at 05/20/23 8295   polyethylene glycol (MIRALAX / GLYCOLAX) packet 17 g  17 g Oral Daily PRN [name], NP   17 g at 04/13/23 1734   sertraline (ZOLOFT) tablet 150 mg  150 mg Oral Daily Massengill, Harrold Donath, MD   150 mg at 05/20/23 6213   SUMAtriptan (IMITREX) tablet 25 mg  25 mg Oral BID PRN [name], NP   25 mg at 05/07/23 2112   traZODone (DESYREL) tablet 50 mg  50  mg Oral QHS [name], NP   50 mg at 05/19/23 2127   Vitamin D (Ergocalciferol) (DRISDOL) 1.25 MG (50000 UNIT) capsule 50,000 Units  50,000 Units Oral Q7 days [name], NP  50,000 Units at 05/11/23 1431    Lab Results:  Results for orders placed or performed during the hospital encounter of 12/20/22 (from the past 48 hour(s))  Urinalysis, Routine w reflex microscopic -Urine, Clean Catch     Status: Abnormal   Collection Time: 05/19/23 12:43 PM  Result Value Ref Range   Color, Urine YELLOW YELLOW   APPearance HAZY (A) CLEAR   Specific Gravity, Urine 1.017 1.005 - 1.030   pH 5.0 5.0 - 8.0   Glucose, UA NEGATIVE NEGATIVE mg/dL   Hgb urine dipstick SMALL (A) NEGATIVE   Bilirubin Urine NEGATIVE NEGATIVE   Ketones, ur NEGATIVE NEGATIVE mg/dL   Protein, ur NEGATIVE NEGATIVE mg/dL   Nitrite POSITIVE (A) NEGATIVE   Leukocytes,Ua LARGE (A) NEGATIVE   RBC / HPF 0-5 0 - 5 RBC/hpf   WBC, UA 21-50 0 - 5 WBC/hpf   Bacteria, UA NONE SEEN NONE SEEN   Squamous Epithelial / HPF 0-5 0 - 5 /HPF   Mucus PRESENT     Comment: Performed at [facility], [address] [place], [place] [address]    Blood Alcohol level:  Lab Results  Component Value Date   [facility] <10 12/19/2022   ETH <10 11/21/2019    Metabolic Disorder Labs: Lab Results  Component Value Date   HGBA1C 4.4 (L) 12/21/2022   MPG 79.58 12/21/2022   No results found for: "PROLACTIN" Lab Results  Component Value Date   CHOL 218 (H) 12/21/2022   TRIG 81 12/21/2022   HDL 53 12/21/2022   CHOLHDL 4.1 12/21/2022   VLDL 16 12/21/2022   LDLCALC 149 (H) 12/21/2022   LDLCALC 110 (H) 03/13/2011    Physical Findings: AIMS: Facial and Oral Movements Muscles of Facial Expression: None Lips and Perioral Area: None Jaw: None Tongue: None,Extremity Movements Upper (arms, wrists, hands, fingers): None Lower (legs, knees, ankles, toes): None, Trunk Movements Neck, shoulders, hips: None, Global  Judgements Severity of abnormal movements overall : None Incapacitation due to abnormal movements: None Patient's awareness of abnormal movements: No Awareness, Dental Status Current problems with teeth and/or dentures?: No Does patient usually wear dentures?: No  CIWA:    COWS:     Musculoskeletal: Strength & Muscle Tone: within normal limits Gait & Station: normal Patient leans: N/A  Psychiatric Specialty Exam:  Presentation  General Appearance:  Disheveled  Eye Contact: Fair  Speech: Slow  Speech Volume: Decreased  Handedness: Right   Mood and Affect  Mood: Dysphoric  Affect: Restricted   Thought Process  Thought Processes: Linear  Descriptions of Associations:Intact  Orientation:Partial  Thought Content:Rumination  History of Schizophrenia/Schizoaffective disorder:Yes  Duration of Psychotic Symptoms:Greater than six months  Hallucinations:Hallucinations: None  Ideas of Reference:None  Suicidal Thoughts:Suicidal Thoughts: No SI Passive Intent and/or Plan: Without Intent; Without Plan  Homicidal Thoughts:Homicidal Thoughts: No   Sensorium  Memory: Immediate Poor; Recent Poor  Judgment: Poor  Insight: Poor   Executive Functions  Concentration: Poor  Attention Span: Fair  Recall: Poor  Fund of Knowledge: Poor  Language: Fair   Psychomotor Activity  Psychomotor Activity: Psychomotor Activity: Decreased   Assets  Assets: Leisure Time   Sleep  Sleep: Sleep: Fair Number of Hours of Sleep: 7.5    Physical Exam: General: Sitting comfortably. NAD. HEENT: Normocephalic, atraumatic, MMM, EMOI Lungs: no increased work of breathing noted Heart: no cyanosis Abdomen: Non distended Musculoskeletal: FROM. No obvious deformities Skin: Warm, dry, intact. No rashes noted Neuro: No obvious focal deficits.  Gait and station  are normal  Review of Systems  Constitutional: Negative.   HENT: Negative.    Eyes:  Negative.   Respiratory: Negative.    Cardiovascular: Negative.   Gastrointestinal: Negative.   Genitourinary: Negative.   Skin: Negative.   Neurological: Negative.   Psychiatric/Behavioral:  Positive for depression.     Blood pressure 96/76, pulse 87, temperature 98.5 F (36.9 C), resp. rate 15, height 4\' 11"  (1.499 m), weight 63 kg, SpO2 94%. Body mass index is 28.05 kg/m.   Treatment Plan Summary: Daily contact with patient to assess and evaluate symptoms and progress in treatment and Medication management.    Still waiting on safe disposition as patient would not be able to live independently given cognitive impairment. Recommend assisted living.    No medication side effects reported.  Because of urinary incontinence, recommended adult diapers to be worn.   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  Plan:  -Continue Imitrex 25 mg PRN BID for migraines -Continue Colace 100 mg daily for constipation -Continue Vitamin D 50.000 units weekly for bone health. -Continue Sertraline150 mg po Q daily for depression/anxiety -Continue Buspar 15 mg po bid for anxiety.  -Continue paliperidone 6 mg po qd for mood stabilization -Continue Trazodone 50 mg nightly for insomnia  -Continue Nicoderm 21 mg topically Q 24 hrs for nicotine withdrawal management. -Continue vitamin B12 1000 mcg IM weekly for 4 weeks then monthly -Continue Melatonin 5 mg nightly for sleep -Continue Protonix EC 40 mg p.o. daily for GERD -Continue MiraLAX 17 g p.o. PRN for constipation -Continue hydroxyzine 25 mg p.o. 3 times daily as needed for anxiety -Continue Ensure nutritional shakes TID in between meals  -Previously discontinued Hydroxyzine 50 mg nightly-hypotension in the  mornings   Safety and Monitoring: Voluntary admission to inpatient psychiatric unit for safety, stabilization and treatment Daily contact with patient to assess and evaluate symptoms and progress in treatment Patient's case to be discussed in multi-disciplinary team meeting Observation Level : q15 minute checks Vital signs: q12 hours Precautions: Safety   Discharge Planning: Social work and case management to assist with discharge planning and identification of hospital follow-up needs prior to discharge Estimated LOS: Unknown at this time. Discharge Concerns: Need to establish a safety plan; Medication compliance and effectiveness Discharge Goals: Return home with outpatient referrals for mental health follow-up including medication management/psychotherapy No legal guardian, has payee  Golda Acre, MD 05/20/2023, 11:50 AM

## 2023-05-20 NOTE — Progress Notes (Signed)
   05/20/23 2045  Psych Admission Type (Psych Patients Only)  Admission Status Involuntary  Psychosocial Assessment  Patient Complaints Anxiety;Depression  Eye Contact Fair  Facial Expression Animated  Affect Appropriate to circumstance  Speech Soft  Interaction Childlike  Motor Activity Slow  Appearance/Hygiene Disheveled  Behavior Characteristics Appropriate to situation  Mood Pleasant;Depressed  Aggressive Behavior  Effect No apparent injury  Thought Process  Coherency WDL  Content WDL  Delusions WDL  Perception WDL  Hallucination None reported or observed  Judgment Limited  Confusion WDL  Danger to Self  Current suicidal ideation? Denies

## 2023-05-20 NOTE — Plan of Care (Signed)
  Problem: Activity: Goal: Imbalance in normal sleep/wake cycle will improve Outcome: Progressing   Problem: Coping: Goal: Coping ability will improve Outcome: Progressing Goal: Will verbalize feelings Outcome: Progressing   Problem: Safety: Goal: Ability to disclose and discuss suicidal ideas will improve Outcome: Progressing Goal: Ability to identify and utilize support systems that promote safety will improve Outcome: Progressing    Problem: Education: Goal: Emotional status will improve Outcome: Progressing   Problem: Education: Goal: Mental status will improve Outcome: Progressing   Problem: Physical Regulation: Goal: Ability to maintain clinical measurements within normal limits will improve Outcome: Progressing

## 2023-05-20 NOTE — BH IP Treatment Plan (Signed)
Interdisciplinary Treatment and Diagnostic Plan Update  05/20/2023 Time of Session: 11:30AM - UPDATE Yolanda [name] MRN: [id number]  Principal Diagnosis: Schizoaffective disorder, depressive type (HCC)  Secondary Diagnoses: Principal Problem:   Schizoaffective disorder, depressive type (HCC) Active Problems:   GERD (gastroesophageal reflux disease)   Intellectual disability   Tobacco use disorder   Current Medications:  Current Facility-Administered Medications  Medication Dose Route Frequency Provider Last Rate Last Admin   acetaminophen (TYLENOL) tablet 650 mg  650 mg Oral Q6H PRN [name], NP   650 mg at 05/18/23 1530   alum & mag hydroxide-simeth (MAALOX/MYLANTA) 200-200-20 MG/5ML suspension 30 mL  30 mL Oral Q4H PRN [name], NP   30 mL at 01/28/23 1530   busPIRone (BUSPAR) tablet 15 mg  15 mg Oral BID [name], NP   15 mg at 05/20/23 5188   cyanocobalamin (VITAMIN B12) injection 1,000 mcg  1,000 mcg Intramuscular Q30 days Sarita Bottom, MD   1,000 mcg at 05/03/23 1257   diphenhydrAMINE (BENADRYL) capsule 50 mg  50 mg Oral TID PRN [name], NP   50 mg at 01/15/23 1211   Or   diphenhydrAMINE (BENADRYL) injection 50 mg  50 mg Intramuscular TID PRN [name], NP       docusate sodium (COLACE) capsule 100 mg  100 mg Oral Daily [name], NP   100 mg at 05/20/23 4166   feeding supplement (ENSURE ENLIVE / ENSURE PLUS) liquid 237 mL  237 mL Oral TID BM [name], NP   237 mL at 05/20/23 0953   haloperidol (HALDOL) tablet 5 mg  5 mg Oral TID PRN [name], NP   5 mg at 05/04/23 1837   Or   haloperidol lactate (HALDOL) injection 5 mg  5 mg Intramuscular TID PRN [name], NP       hydrOXYzine (ATARAX) tablet 25 mg  25 mg Oral TID PRN [name], DO   25 mg at 05/19/23 1218   LORazepam (ATIVAN) tablet 2 mg  2 mg Oral TID PRN [name], NP   2 mg at 01/15/23 1211   Or   LORazepam (ATIVAN) injection 2 mg  2 mg Intramuscular TID PRN  [name], NP       magnesium hydroxide (MILK OF MAGNESIA) suspension 30 mL  30 mL Oral Daily PRN [name], NP   30 mL at 04/13/23 0814   melatonin tablet 5 mg  5 mg Oral QHS Nkwenti, Doris, NP   5 mg at 05/19/23 2127   nicotine (NICODERM CQ - dosed in mg/24 hours) patch 21 mg  21 mg Transdermal Daily [name], DO   21 mg at 05/14/23 0630   nicotine polacrilex (NICORETTE) gum 2 mg  2 mg Oral PRN [name], MD   2 mg at 05/19/23 1219   paliperidone (INVEGA) 24 hr tablet 6 mg  6 mg Oral Daily [name], NP   6 mg at 05/19/23 2127   pantoprazole (PROTONIX) EC tablet 40 mg  40 mg Oral Daily [name], NP   40 mg at 05/20/23 0808   polyethylene glycol (MIRALAX / GLYCOLAX) packet 17 g  17 g Oral Daily PRN [name], NP   17 g at 04/13/23 1734   sertraline (ZOLOFT) tablet 150 mg  150 mg Oral Daily Massengill, Harrold Donath, MD   150 mg at 05/20/23 0807   SUMAtriptan (IMITREX) tablet 25 mg  25 mg Oral BID PRN [name], NP  25 mg at 05/07/23 2112   traZODone (DESYREL) tablet 50 mg  50 mg Oral QHS [name], NP   50 mg at 05/19/23 2127   Vitamin D (Ergocalciferol) (DRISDOL) 1.25 MG (50000 UNIT) capsule 50,000 Units  50,000 Units Oral Q7 days [name], NP   50,000 Units at 05/11/23 1431   PTA Medications: Medications Prior to Admission  Medication Sig Dispense Refill Last Dose   busPIRone (BUSPAR) 15 MG tablet Take 15 mg by mouth 2 (two) times daily. (Patient not taking: Reported on 12/19/2022)      paliperidone (INVEGA SUSTENNA) 156 MG/ML SUSY injection Inject 156 mg into the muscle once. (Patient not taking: Reported on 12/19/2022)      sertraline (ZOLOFT) 50 MG tablet Take 150 mg by mouth daily. (Patient not taking: Reported on 12/19/2022)      traZODone (DESYREL) 100 MG tablet Take 100 mg by mouth at bedtime as needed for sleep. (Patient not taking: Reported on 11/12/2022)       Patient Stressors: Medication change or noncompliance    Patient Strengths:  Forensic psychologist fund of knowledge   Treatment Modalities: Medication Management, Group therapy, Case management,  1 to 1 session with clinician, Psychoeducation, Recreational therapy.   Physician Treatment Plan for Primary Diagnosis: Schizoaffective disorder, depressive type (HCC) Long Term Goal(s): Improvement in symptoms so as ready for discharge   Short Term Goals: Ability to identify and develop effective coping behaviors will improve Ability to maintain clinical measurements within normal limits will improve Compliance with prescribed medications will improve Ability to identify triggers associated with substance abuse/mental health issues will improve Ability to identify changes in lifestyle to reduce recurrence of condition will improve Ability to verbalize feelings will improve Ability to disclose and discuss suicidal ideas Ability to demonstrate self-control will improve  Medication Management: Evaluate patient's response, side effects, and tolerance of medication regimen.  Therapeutic Interventions: 1 to 1 sessions, Unit Group sessions and Medication administration.  Evaluation of Outcomes: Progressing  Physician Treatment Plan for Secondary Diagnosis: Principal Problem:   Schizoaffective disorder, depressive type (HCC) Active Problems:   GERD (gastroesophageal reflux disease)   Intellectual disability   Tobacco use disorder  Long Term Goal(s): Improvement in symptoms so as ready for discharge   Short Term Goals: Ability to identify and develop effective coping behaviors will improve Ability to maintain clinical measurements within normal limits will improve Compliance with prescribed medications will improve Ability to identify triggers associated with substance abuse/mental health issues will improve Ability to identify changes in lifestyle to reduce recurrence of condition will improve Ability to verbalize feelings will improve Ability to disclose  and discuss suicidal ideas Ability to demonstrate self-control will improve     Medication Management: Evaluate patient's response, side effects, and tolerance of medication regimen.  Therapeutic Interventions: 1 to 1 sessions, Unit Group sessions and Medication administration.  Evaluation of Outcomes: Progressing   RN Treatment Plan for Primary Diagnosis: Schizoaffective disorder, depressive type (HCC) Long Term Goal(s): Knowledge of disease and therapeutic regimen to maintain health will improve  Short Term Goals: Ability to remain free from injury will improve, Ability to verbalize frustration and anger appropriately will improve, Ability to participate in decision making will improve, Ability to verbalize feelings will improve, Ability to identify and develop effective coping behaviors will improve, and Compliance with prescribed medications will improve  Medication Management: RN will administer medications as ordered by provider, will assess and evaluate patient's response and provide education to patient for prescribed  medication. RN will report any adverse and/or side effects to prescribing provider.  Therapeutic Interventions: 1 on 1 counseling sessions, Psychoeducation, Medication administration, Evaluate responses to treatment, Monitor vital signs and CBGs as ordered, Perform/monitor CIWA, COWS, AIMS and Fall Risk screenings as ordered, Perform wound care treatments as ordered.  Evaluation of Outcomes: Progressing   LCSW Treatment Plan for Primary Diagnosis: Schizoaffective disorder, depressive type (HCC) Long Term Goal(s): Safe transition to appropriate next level of care at discharge, Engage patient in therapeutic group addressing interpersonal concerns.  Short Term Goals: Engage patient in aftercare planning with referrals and resources, Increase social support, Increase emotional regulation, Facilitate acceptance of mental health diagnosis and concerns, Identify triggers  associated with mental health/substance abuse issues, and Increase skills for wellness and recovery  Therapeutic Interventions: Assess for all discharge needs, 1 to 1 time with Social worker, Explore available resources and support systems, Assess for adequacy in community support network, Educate family and significant other(s) on suicide prevention, Complete Psychosocial Assessment, Interpersonal group therapy.  Evaluation of Outcomes: Progressing   Progress in Treatment: Attending groups: Yes. Participating in groups: Yes. Taking medication as prescribed: Yes. Toleration medication: Yes. Family/Significant other contact made: Yes, individual(s) contacted:  DSS ( [place] guardian )  Patient understands diagnosis: Yes. Discussing patient identified problems/goals with staff: Yes. Medical problems stabilized or resolved: Yes. Denies suicidal/homicidal ideation: Yes. Issues/concerns per patient self-inventory: No.   New problem(s) identified: No, Describe:  None reported    New Short Term/Long Term Goal(s):   medication stabilization, elimination of SI thoughts, development of comprehensive mental wellness plan.     Patient Goals:  " placement "    Discharge Plan or Barriers: Patient recently admitted. CSW will continue to follow and assess for appropriate referrals and possible discharge planning.  Pt is awaiting to be placed at [facility] in [place]    Reason for Continuation of Hospitalization: Anxiety Depression Medication stabilization Other; describe Placement    Estimated Length of Stay: waiting on her Medicaid B to be converted to Med C  Last 3 [place] Suicide Severity Risk Score: Flowsheet Row Ad[facility] (Current) [facility] from 12[facility]19/2022 [facility] from 11/12/2022 [facility]  C-SSRS RISK CATEGORY High Risk  High Risk Moderate Risk       Last [facility] 2/9 Scores:     No data to display          Scribe for Treatment Team: Kathi Der, LCSWA 05/20/2023 1:51 PM

## 2023-05-20 NOTE — [facility] Group Notes (Signed)
[facility] Group Notes:  (Nursing/MHT/Case Management/Adjunct)  Date:  05/20/2023  Time:  2000 Type of Therapy:   Wrap up group  Participation Level:  Active  Participation Quality:  Appropriate, Attentive, Sharing, and Supportive  Affect:  Anxious and Appropriate  Cognitive:  Lacking  Insight:  Limited  Engagement in Group:  Engaged  Modes of Intervention:  Clarification, Education, and Support  Summary of Progress/Problems: Positive thinking and positive change were discussed.   [name] 05/20/2023, 10:16 PM

## 2023-05-20 NOTE — Group Note (Signed)
Date:  05/20/2023 Time:  10:02 AM  Group Topic/Focus:  Goals Group:   The focus of this group is to help patients establish daily goals to achieve during treatment and discuss how the patient can incorporate goal setting into their daily lives to aide in recovery. Orientation:   The focus of this group is to educate the patient on the purpose and policies of crisis stabilization and provide a format to answer questions about their admission.  The group details unit policies and expectations of patients while admitted.    Participation Level:  Did Not Attend  Additional Comments:  Patient was encouraged to attend group multiple times.   Kiahna Banghart T Taren Toops 05/20/2023, 10:02 AM

## 2023-05-20 NOTE — Group Note (Signed)
Recreation Therapy Group Note   Group Topic:Stress Management  Group Date: 05/20/2023 Start Time: 0940 End Time: 1010 Facilitators: Doreatha Offer-McCall, LRT,CTRS Location: [address] Dayroom   Group Topic: Stress Management  Goal Area(s) Addresses:  Patient will identify positive stress management techniques. Patient will identify benefits of using stress management post d/c.  Group Description: Meditation. LRT played a meditation for patients that focused on patience. The meditation also led patients through a body scan to get in tune with any sensations they may be feeling.   Education:  Stress Management, Discharge Planning.   Education Outcome: Acknowledges Education   Affect/Mood: N/A   Participation Level: Did not attend    Clinical Observations/Individualized Feedback:     Plan: Continue to engage patient in RT group sessions 2-3x/week.   Nevaeh Korte-McCall, LRT,CTRS 05/20/2023 11:57 AM

## 2023-05-20 NOTE — Progress Notes (Signed)
   05/20/23 0542  15 Minute Checks  Location Bedroom  Visual Appearance Calm  Behavior Sleeping  Sleep (Behavioral Health Patients Only)  Calculate sleep? (Click Yes once per 24 hr at 0600 safety check) Yes  Documented sleep last 24 hours 7.5

## 2023-05-21 DIAGNOSIS — F251 Schizoaffective disorder, depressive type: Secondary | ICD-10-CM | POA: Diagnosis not present

## 2023-05-21 NOTE — Plan of Care (Signed)
  Problem: Activity: Goal: Interest or engagement in leisure activities will improve Outcome: Progressing   Problem: Coping: Goal: Coping ability will improve Outcome: Progressing Goal: Will verbalize feelings Outcome: Progressing   Problem: Health Behavior/Discharge Planning: Goal: Compliance with therapeutic regimen will improve Outcome: Progressing

## 2023-05-21 NOTE — Progress Notes (Signed)
[facility] MD Progress Note  05/21/2023 11:40 AM Yolanda [name]  MRN:  [id number] Subjective:  per report, patient was compliant with medication overnight.  She was reportedly sad and tearful again.  The patient was minimally engaged in the examination today.  She had to be aroused from sleep and was somnolent throughout the interview.  She did not endorse any new complaints or concerns.  She asked if it was okay for her grandson to come visit, and I explained that there were no restrictions to her visitors.  She denies feeling sad or depressed, but her affect is markedly dysphoric suggesting that she has not providing a reliable history.  She also denies auditory visual hallucinations.  She reports that sleep, energy, and appetite are adequate.  Social work continues to look for placement.  Principal Problem: Schizoaffective disorder, depressive type (HCC) Diagnosis: Principal Problem:   Schizoaffective disorder, depressive type (HCC) Active Problems:   GERD (gastroesophageal reflux disease)   Intellectual disability   Tobacco use disorder  Total Time spent with patient: 20 minutes  Past Psychiatric History: see H&P  Past Medical History:  Past Medical History:  Diagnosis Date   Bipolar affect, depressed (HCC)    Constipation 08/17/2022   Depression    Falls 07/21/2022   Fracture of femoral neck, right, closed (HCC) 01/17/2022   Herpes simplex 08/22/2017   Open fracture dislocation of right elbow joint 01/17/2022    Past Surgical History:  Procedure Laterality Date   NO PAST SURGERIES     SALPINGECTOMY     Family History: History reviewed. No pertinent family history. Family Psychiatric  History: see H&P Social History:  Social History   Substance and Sexual Activity  Alcohol Use Yes     Social History   Substance and Sexual Activity  Drug Use Yes   Types: Cocaine, Marijuana    Social History   Socioeconomic History   Marital status: Single    Spouse name: Not on  file   Number of children: Not on file   Years of education: Not on file   Highest education level: Not on file  Occupational History   Not on file  Tobacco Use   Smoking status: Every Day   Smokeless tobacco: Not on file  Substance and Sexual Activity   Alcohol use: Yes   Drug use: Yes    Types: Cocaine, Marijuana   Sexual activity: Yes  Other Topics Concern   Not on file  Social History Narrative   Not on file   Social Determinants of Health   Financial Resource Strain: Low Risk  (09/12/2022)   Received from [facility], [facility]   Overall Financial Resource Strain (CARDIA)    Difficulty of Paying Living Expenses: Not hard at all  Food Insecurity: Patient Declined (12/20/2022)   Hunger Vital Sign    Worried About Running Out of Food in the Last Year: Patient declined    Ran Out of Food in the Last Year: Patient declined  Transportation Needs: No Transportation Needs (12/20/2022)   PRAPARE - Administrator, Civil Service (Medical): No    Lack of Transportation (Non-Medical): No  Physical Activity: Not on file  Stress: No Stress Concern Present (07/17/2022)   Received from [facility], [facility] of Occupational Health - Occupational Stress Questionnaire    Feeling of Stress : Not at all  Social Connections: Unknown (07/16/2022)   Received from [facility], [facility]   Social Network  Social Network: Not on file   Additional Social History:                         Sleep: Good  Appetite:  Poor  Current Medications: Current Facility-Administered Medications  Medication Dose Route Frequency Provider Last Rate Last Admin   acetaminophen (TYLENOL) tablet 650 mg  650 mg Oral Q6H PRN [name], NP   650 mg at 05/18/23 1530   alum & mag hydroxide-simeth (MAALOX/MYLANTA) 200-200-20 MG/5ML suspension 30 mL  30 mL Oral Q4H PRN [name], NP   30 mL at 01/28/23 1530   busPIRone (BUSPAR) tablet 15 mg  15 mg Oral  BID [name], NP   15 mg at 05/21/23 2956   cyanocobalamin (VITAMIN B12) injection 1,000 mcg  1,000 mcg Intramuscular Q30 days Sarita Bottom, MD   1,000 mcg at 05/03/23 1257   diphenhydrAMINE (BENADRYL) capsule 50 mg  50 mg Oral TID PRN [name], NP   50 mg at 01/15/23 1211   Or   diphenhydrAMINE (BENADRYL) injection 50 mg  50 mg Intramuscular TID PRN [name], NP       docusate sodium (COLACE) capsule 100 mg  100 mg Oral Daily [name], NP   100 mg at 05/21/23 0943   feeding supplement (ENSURE ENLIVE / ENSURE PLUS) liquid 237 mL  237 mL Oral TID BM [name], NP   237 mL at 05/20/23 2154   haloperidol (HALDOL) tablet 5 mg  5 mg Oral TID PRN [name], NP   5 mg at 05/04/23 2130   Or   haloperidol lactate (HALDOL) injection 5 mg  5 mg Intramuscular TID PRN [name], NP       hydrOXYzine (ATARAX) tablet 25 mg  25 mg Oral TID PRN [name], DO   25 mg at 05/19/23 1218   LORazepam (ATIVAN) tablet 2 mg  2 mg Oral TID PRN [name], NP   2 mg at 01/15/23 1211   Or   LORazepam (ATIVAN) injection 2 mg  2 mg Intramuscular TID PRN [name], NP       magnesium hydroxide (MILK OF MAGNESIA) suspension 30 mL  30 mL Oral Daily PRN [name], NP   30 mL at 04/13/23 0814   melatonin tablet 5 mg  5 mg Oral QHS Nkwenti, Doris, NP   5 mg at 05/20/23 2128   nicotine (NICODERM CQ - dosed in mg/24 hours) patch 21 mg  21 mg Transdermal Daily [name], DO   21 mg at 05/14/23 8657   nicotine polacrilex (NICORETTE) gum 2 mg  2 mg Oral PRN [name], MD   2 mg at 05/20/23 1511   paliperidone (INVEGA) 24 hr tablet 6 mg  6 mg Oral Daily [name], NP   6 mg at 05/20/23 2128   pantoprazole (PROTONIX) EC tablet 40 mg  40 mg Oral Daily [name], NP   40 mg at 05/20/23 0808   polyethylene glycol (MIRALAX / GLYCOLAX) packet 17 g  17 g Oral Daily PRN [name], NP   17 g at 04/13/23 1734   sertraline (ZOLOFT) tablet 150 mg  150 mg Oral Daily  Massengill, Harrold Donath, MD   150 mg at 05/21/23 0944   SUMAtriptan (IMITREX) tablet 25 mg  25 mg Oral BID PRN [name], NP   25 mg at 05/07/23 2112   traZODone (DESYREL) tablet 50 mg  50 mg Oral QHS  [name], NP   50 mg at 05/20/23 2128   Vitamin D (Ergocalciferol) (DRISDOL) 1.25 MG (50000 UNIT) capsule 50,000 Units  50,000 Units Oral Q7 days [name], NP   50,000 Units at 05/11/23 1431    Lab Results:  Results for orders placed or performed during the hospital encounter of 12/20/22 (from the past 48 hour(s))  Urinalysis, Routine w reflex microscopic -Urine, Clean Catch     Status: Abnormal   Collection Time: 05/19/23 12:43 PM  Result Value Ref Range   Color, Urine YELLOW YELLOW   APPearance HAZY (A) CLEAR   Specific Gravity, Urine 1.017 1.005 - 1.030   pH 5.0 5.0 - 8.0   Glucose, UA NEGATIVE NEGATIVE mg/dL   Hgb urine dipstick SMALL (A) NEGATIVE   Bilirubin Urine NEGATIVE NEGATIVE   Ketones, ur NEGATIVE NEGATIVE mg/dL   Protein, ur NEGATIVE NEGATIVE mg/dL   Nitrite POSITIVE (A) NEGATIVE   Leukocytes,Ua LARGE (A) NEGATIVE   RBC / HPF 0-5 0 - 5 RBC/hpf   WBC, UA 21-50 0 - 5 WBC/hpf   Bacteria, UA NONE SEEN NONE SEEN   Squamous Epithelial / HPF 0-5 0 - 5 /HPF   Mucus PRESENT     Comment: Performed at [facility], [address], [place], [place] [address]    Blood Alcohol level:  Lab Results  Component Value Date   [facility] <10 12/19/2022   ETH <10 11/21/2019    Metabolic Disorder Labs: Lab Results  Component Value Date   HGBA1C 4.4 (L) 12/21/2022   MPG 79.58 12/21/2022   No results found for: "PROLACTIN" Lab Results  Component Value Date   CHOL 218 (H) 12/21/2022   TRIG 81 12/21/2022   HDL 53 12/21/2022   CHOLHDL 4.1 12/21/2022   VLDL 16 12/21/2022   LDLCALC 149 (H) 12/21/2022   LDLCALC 110 (H) 03/13/2011    Physical Findings: AIMS: Facial and Oral Movements Muscles of Facial Expression: None Lips and Perioral Area: None Jaw:  None Tongue: None,Extremity Movements Upper (arms, wrists, hands, fingers): None Lower (legs, knees, ankles, toes): None, Trunk Movements Neck, shoulders, hips: None, Global Judgements Severity of abnormal movements overall : None Incapacitation due to abnormal movements: None Patient's awareness of abnormal movements: No Awareness, Dental Status Current problems with teeth and/or dentures?: No Does patient usually wear dentures?: No  CIWA:    COWS:     Musculoskeletal: Strength & Muscle Tone: within normal limits Gait & Station: normal Patient leans: N/A  Psychiatric Specialty Exam:  Presentation  General Appearance:  Disheveled  Eye Contact: Fair  Speech: Slow  Speech Volume: Decreased  Handedness: Right   Mood and Affect  Mood: Dysphoric; Depressed  Affect: Restricted   Thought Process  Thought Processes: Linear  Descriptions of Associations:Intact  Orientation:Partial  Thought Content:Rumination  History of Schizophrenia/Schizoaffective disorder:Yes  Duration of Psychotic Symptoms:Greater than six months  Hallucinations:Hallucinations: None  Ideas of Reference:None  Suicidal Thoughts:Suicidal Thoughts: No  Homicidal Thoughts:Homicidal Thoughts: No   Sensorium  Memory: Recent Poor; Immediate Poor  Judgment: Poor  Insight: Poor   Executive Functions  Concentration: Poor  Attention Span: Fair  Recall: Poor  Fund of Knowledge: Poor  Language: Fair   Psychomotor Activity  Psychomotor Activity: Psychomotor Activity: Decreased   Assets  Assets: Leisure Time   Sleep  Sleep: Sleep: Fair Number of Hours of Sleep: 7.5    Physical Exam: General: Sitting comfortably. NAD. HEENT: Normocephalic, atraumatic, MMM, EMOI Lungs: no increased work of breathing noted Heart:  no cyanosis Abdomen: Non distended Musculoskeletal: FROM. No obvious deformities Skin: Warm, dry, intact. No rashes noted Neuro: No obvious  focal deficits.  Gait and station are normal  Review of Systems  Constitutional: Negative.   HENT: Negative.    Eyes: Negative.   Respiratory: Negative.    Cardiovascular: Negative.   Gastrointestinal: Negative.   Genitourinary: Negative.   Skin: Negative.   Neurological: Negative.   Psychiatric/Behavioral:  Positive for depression.     Blood pressure (!) 96/52, pulse (!) 103, temperature 98.5 F (36.9 C), resp. rate 15, height 4\' 11"  (1.499 m), weight 63 kg, SpO2 99%. Body mass index is 28.05 kg/m.   Treatment Plan Summary: Daily contact with patient to assess and evaluate symptoms and progress in treatment and Medication management.    Still waiting on safe disposition as patient would not be able to live independently given cognitive impairment. Recommend assisted living.    No medication side effects reported.  Because of urinary incontinence, recommended adult diapers to be worn.   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  Plan:  -Continue Imitrex 25 mg PRN BID for migraines -Continue Colace 100 mg daily for constipation -Continue Vitamin D 50.000 units weekly for bone health. -Continue Sertraline150 mg po Q daily for depression/anxiety -Continue Buspar 15 mg po bid for anxiety.  -Continue paliperidone 6 mg po qd for mood stabilization -Continue Trazodone 50 mg nightly for insomnia  -Continue Nicoderm 21 mg topically Q 24 hrs for nicotine withdrawal management. -Continue vitamin B12 1000 mcg IM weekly for 4 weeks then monthly -Continue Melatonin 5 mg nightly for sleep -Continue Protonix EC 40 mg p.o. daily for GERD -Continue MiraLAX 17 g p.o. PRN for constipation -Continue hydroxyzine 25 mg p.o. 3 times daily as needed for  anxiety -Continue Ensure nutritional shakes TID in between meals  -Previously discontinued Hydroxyzine 50 mg nightly-hypotension in the mornings   Safety and Monitoring: Voluntary admission to inpatient psychiatric unit for safety, stabilization and treatment Daily contact with patient to assess and evaluate symptoms and progress in treatment Patient's case to be discussed in multi-disciplinary team meeting Observation Level : q15 minute checks Vital signs: q12 hours Precautions: Safety   Discharge Planning: Social work and case management to assist with discharge planning and identification of hospital follow-up needs prior to discharge Estimated LOS: Unknown at this time. Discharge Concerns: Need to establish a safety plan; Medication compliance and effectiveness Discharge Goals: Return home with outpatient referrals for mental health follow-up including medication management/psychotherapy No legal guardian, has payee  Golda Acre, MD 05/21/2023, 11:40 AM

## 2023-05-21 NOTE — Group Note (Signed)
Recreation Therapy Group Note   Group Topic:Animal Assisted Therapy   Group Date: 05/21/2023 Start Time: 0955 End Time: 1030 Facilitators: Dhamar Gregory-McCall, LRT,CTRS Location: [address] Dayroom   Animal-Assisted Activity (AAA) Program Checklist/Progress Notes Patient Eligibility Criteria Checklist & Daily Group note for Rec Tx Intervention  AAA/T Program Assumption of Risk Form signed by Patient/ or Parent Legal Guardian Yes  Patient understands his/her participation is voluntary Yes  Education: Charity fundraiser, Appropriate Animal Interaction   Education Outcome: Acknowledges education.    Affect/Mood: N/A   Participation Level: Did not attend    Clinical Observations/Individualized Feedback:     Plan: Continue to engage patient in RT group sessions 2-3x/week.   Evann Koelzer-McCall, LRT,CTRS 05/21/2023 1:22 PM

## 2023-05-21 NOTE — Group Note (Signed)
LCSW Group Therapy Note  Group Date: 05/21/2023 Start Time: 1100 End Time: 1200   Type of Therapy and Topic:  Group Therapy - Healthy vs Unhealthy Coping Skills  Participation Level:  Did Not Attend   Description of Group The focus of this group was to determine what unhealthy coping techniques typically are used by group members and what healthy coping techniques would be helpful in coping with various problems. Patients were guided in becoming aware of the differences between healthy and unhealthy coping techniques. Patients were asked to identify 2-3 healthy coping skills they would like to learn to use more effectively.  Therapeutic Goals Patients learned that coping is what human beings do all day long to deal with various situations in their lives Patients defined and discussed healthy vs unhealthy coping techniques Patients identified their preferred coping techniques and identified whether these were healthy or unhealthy Patients determined 2-3 healthy coping skills they would like to become more familiar with and use more often. Patients provided support and ideas to each other   Summary of Patient Progress:  Did not attend   Therapeutic Modalities Cognitive Behavioral Therapy Motivational Interviewing  Kathi Der, LCSWA 05/21/2023  1:05 PM

## 2023-05-21 NOTE — [facility] Group Notes (Signed)
[facility] Group Notes:  (Nursing/MHT/Case Management/Adjunct)  Date:  05/21/2023  Time:  9:32 PM  Type of Therapy:   Wrap-up group  Participation Level:  Active  Participation Quality:  Appropriate  Affect:  Appropriate  Cognitive:  Appropriate  Insight:  Appropriate  Engagement in Group:  Engaged  Modes of Intervention:  Education  Summary of Progress/Problems: Pt goal get D/C. Rated day 5/10.  Yolanda [name] 05/21/2023, 9:32 PM

## 2023-05-21 NOTE — Progress Notes (Signed)
Yolanda [name] is in be and responds to voice alert and oriented Si/HI/AVH sad and tearful at time but calm and cooperative

## 2023-05-21 NOTE — Progress Notes (Signed)
Spoke with Yolanda [name] she said"I don't know why things aren't happening the way they are supposed to" She expressed that "She doesn't know why she is still here and why it is taking so long" She was tearful and sad and asked when [name], the social worker was coming back. Denies SI/HI/AVH

## 2023-05-21 NOTE — [facility] Group Notes (Signed)
Psychoeducational Group Note  Date:  05/21/2023 Time:  8:30  Group Topic/Focus:  Goals Group:   The focus of this group is to help patients establish daily goals to achieve during treatment and discuss how the patient can incorporate goal setting into their daily lives to aide in recovery.  Participation Level: Did Not Attend  Participation Quality:  Not Applicable  Affect:  Not Applicable  Cognitive:  Not Applicable  Insight:  Not Applicable  Engagement in Group: Not Applicable  Additional Comments:  Pt was in bed asleep.  Sabreena Vogan, Sharen Counter 05/21/2023, 10:08 AM

## 2023-05-21 NOTE — Progress Notes (Signed)
   05/21/23 2245  Psych Admission Type (Psych Patients Only)  Admission Status Involuntary  Psychosocial Assessment  Patient Complaints Anxiety;Crying spells  Eye Contact Fair  Facial Expression Animated  Affect Appropriate to circumstance  Speech Soft  Interaction Childlike  Motor Activity Slow  Appearance/Hygiene Disheveled  Behavior Characteristics Cooperative  Mood Preoccupied;Depressed;Pleasant  Aggressive Behavior  Effect No apparent injury  Thought Process  Coherency WDL  Content WDL  Delusions WDL  Perception WDL  Hallucination None reported or observed  Judgment Limited  Confusion WDL  Danger to Self  Current suicidal ideation? Denies

## 2023-05-22 DIAGNOSIS — F251 Schizoaffective disorder, depressive type: Secondary | ICD-10-CM | POA: Diagnosis not present

## 2023-05-22 NOTE — Group Note (Signed)
Recreation Therapy Group Note   Group Topic:Problem Solving  Group Date: 05/22/2023 Start Time: 0935 End Time: 1010 Facilitators: Quindell Shere-McCall, LRT,CTRS Location: 300 [address] Dayroom   Goal Area(s) Addresses:  Patient will effectively work with peer towards shared goal.  Patient will identify skills used to make activity successful.  Patient will identify how skills used during activity can be used to reach post d/c goals.   Intervention: STEM Activity  Group Description: Straw Bridge. In teams of 3-5, patients were given 15 plastic drinking straws and an equal length of masking tape. Using the materials provided, patients were instructed to build a free standing bridge-like structure to suspend an everyday item (ex: puzzle box) off of the floor or table surface. All materials were required to be used by the team in their design. LRT facilitated post-activity discussion reviewing team process. Patients were encouraged to reflect how the skills used in this activity can be generalized to daily life post discharge.   Education: Pharmacist, community, Scientist, physiological, Discharge Planning   Education Outcome: Acknowledges education/In group clarification offered/Needs additional education.    Affect/Mood: N/A   Participation Level: Did not attend    Clinical Observations/Individualized Feedback:     Plan: Continue to engage patient in RT group sessions 2-3x/week.   Allena Pietila-McCall, LRT,CTRS 05/22/2023 12:24 PM

## 2023-05-22 NOTE — [facility] Group Notes (Signed)
The focus of this group is to help patients establish daily goals to achieve during treatment and discuss how the patient can incorporate goal setting into their daily lives to aide in recovery.   Pt did Not attend group

## 2023-05-22 NOTE — Plan of Care (Signed)
  Problem: Education: Goal: Utilization of techniques to improve thought processes will improve Outcome: Adequate for Discharge Goal: Knowledge of the prescribed therapeutic regimen will improve Outcome: Adequate for Discharge   Problem: Activity: Goal: Interest or engagement in leisure activities will improve Outcome: Adequate for Discharge Goal: Imbalance in normal sleep/wake cycle will improve Outcome: Adequate for Discharge   Problem: Coping: Goal: Coping ability will improve Outcome: Adequate for Discharge Goal: Will verbalize feelings Outcome: Adequate for Discharge   Problem: Health Behavior/Discharge Planning: Goal: Ability to make decisions will improve Outcome: Adequate for Discharge Goal: Compliance with therapeutic regimen will improve Outcome: Adequate for Discharge

## 2023-05-22 NOTE — Progress Notes (Signed)
[facility] MD Progress Note  05/22/2023 11:51 AM Yolanda [name]  MRN:  [id number] Subjective: Per nursing report, patient was compliant with medication overnight.  She declined a nicotine patch stating that she no longer needs it.  On exam today this was patient was slightly more engaged in the interview.  She describes her mood as "okay" today.  She was reading her GED book as she is studying for the GED exam.  She states she is also been reading the Bible.  She is unable to tell me what her favorite biblical story is.  She is unable to articulate the subjects that she is studying for the GED.  Her mood was slightly brighter than yesterday.  She denied any new complaints or concerns.  She denied suicidal or homicidal ideation.  She denied auditory or visual hallucinations.  Patient is not capable of living independently and social work continues to look for placement.    Principal Problem: Schizoaffective disorder, depressive type (HCC) Diagnosis: Principal Problem:   Schizoaffective disorder, depressive type (HCC) Active Problems:   GERD (gastroesophageal reflux disease)   Intellectual disability   Tobacco use disorder  Total Time spent with patient: 20 minutes  Past Psychiatric History: see H&P  Past Medical History:  Past Medical History:  Diagnosis Date   Bipolar affect, depressed (HCC)    Constipation 08/17/2022   Depression    Falls 07/21/2022   Fracture of femoral neck, right, closed (HCC) 01/17/2022   Herpes simplex 08/22/2017   Open fracture dislocation of right elbow joint 01/17/2022    Past Surgical History:  Procedure Laterality Date   NO PAST SURGERIES     SALPINGECTOMY     Family History: History reviewed. No pertinent family history. Family Psychiatric  History: see H&P Social History:  Social History   Substance and Sexual Activity  Alcohol Use Yes     Social History   Substance and Sexual Activity  Drug Use Yes   Types: Cocaine, Marijuana    Social  History   Socioeconomic History   Marital status: Single    Spouse name: Not on file   Number of children: Not on file   Years of education: Not on file   Highest education level: Not on file  Occupational History   Not on file  Tobacco Use   Smoking status: Every Day   Smokeless tobacco: Not on file  Substance and Sexual Activity   Alcohol use: Yes   Drug use: Yes    Types: Cocaine, Marijuana   Sexual activity: Yes  Other Topics Concern   Not on file  Social History Narrative   Not on file   Social Determinants of Health   Financial Resource Strain: Low Risk  (09/12/2022)   Received from [facility], [facility]   Overall Financial Resource Strain (CARDIA)    Difficulty of Paying Living Expenses: Not hard at all  Food Insecurity: Patient Declined (12/20/2022)   Hunger Vital Sign    Worried About Running Out of Food in the Last Year: Patient declined    Ran Out of Food in the Last Year: Patient declined  Transportation Needs: No Transportation Needs (12/20/2022)   PRAPARE - Administrator, Civil Service (Medical): No    Lack of Transportation (Non-Medical): No  Physical Activity: Not on file  Stress: No Stress Concern Present (07/17/2022)   Received from [facility], [facility] - Occupational Stress Questionnaire    Feeling of  Stress : Not at all  Social Connections: Unknown (07/16/2022)   Received from [facility], [facility]   Social Network    Social Network: Not on file   Additional Social History:                         Sleep: Good  Appetite:  Poor  Current Medications: Current Facility-Administered Medications  Medication Dose Route Frequency Provider Last Rate Last Admin   acetaminophen (TYLENOL) tablet 650 mg  650 mg Oral Q6H PRN [name], NP   650 mg at 05/18/23 1530   alum & mag hydroxide-simeth (MAALOX/MYLANTA) 200-200-20 MG/5ML suspension 30 mL  30 mL Oral Q4H PRN  [name], NP   30 mL at 01/28/23 1530   busPIRone (BUSPAR) tablet 15 mg  15 mg Oral BID [name], NP   15 mg at 05/22/23 4098   cyanocobalamin (VITAMIN B12) injection 1,000 mcg  1,000 mcg Intramuscular Q30 days Sarita Bottom, MD   1,000 mcg at 05/03/23 1257   diphenhydrAMINE (BENADRYL) capsule 50 mg  50 mg Oral TID PRN [name], NP   50 mg at 01/15/23 1211   Or   diphenhydrAMINE (BENADRYL) injection 50 mg  50 mg Intramuscular TID PRN [name], NP       docusate sodium (COLACE) capsule 100 mg  100 mg Oral Daily [name], NP   100 mg at 05/22/23 1191   feeding supplement (ENSURE ENLIVE / ENSURE PLUS) liquid 237 mL  237 mL Oral TID BM [name], NP   237 mL at 05/22/23 1101   haloperidol (HALDOL) tablet 5 mg  5 mg Oral TID PRN [name], NP   5 mg at 05/04/23 1837   Or   haloperidol lactate (HALDOL) injection 5 mg  5 mg Intramuscular TID PRN [name], NP       hydrOXYzine (ATARAX) tablet 25 mg  25 mg Oral TID PRN [name], DO   25 mg at 05/19/23 1218   LORazepam (ATIVAN) tablet 2 mg  2 mg Oral TID PRN [name], NP   2 mg at 01/15/23 1211   Or   LORazepam (ATIVAN) injection 2 mg  2 mg Intramuscular TID PRN [name], NP       magnesium hydroxide (MILK OF MAGNESIA) suspension 30 mL  30 mL Oral Daily PRN [name], NP   30 mL at 04/13/23 0814   melatonin tablet 5 mg  5 mg Oral QHS Nkwenti, Doris, NP   5 mg at 05/21/23 2106   nicotine polacrilex (NICORETTE) gum 2 mg  2 mg Oral PRN [name], MD   2 mg at 05/20/23 1511   paliperidone (INVEGA) 24 hr tablet 6 mg  6 mg Oral Daily [name], NP   6 mg at 05/21/23 2106   pantoprazole (PROTONIX) EC tablet 40 mg  40 mg Oral Daily [name], NP   40 mg at 05/22/23 4782   polyethylene glycol (MIRALAX / GLYCOLAX) packet 17 g  17 g Oral Daily PRN [name], NP   17 g at 04/13/23 1734   sertraline (ZOLOFT) tablet 150 mg  150 mg Oral Daily Massengill, Harrold Donath, MD   150 mg at 05/22/23  [id number]   SUMAtriptan (IMITREX) tablet 25 mg  25 mg Oral BID PRN [name], NP   25 mg at 05/07/23 2112   traZODone (DESYREL) tablet 50 mg  50 mg Oral QHS [name], NP  50 mg at 05/21/23 2106   Vitamin D (Ergocalciferol) (DRISDOL) 1.25 MG (50000 UNIT) capsule 50,000 Units  50,000 Units Oral Q7 days [name], NP   50,000 Units at 05/11/23 1431    Lab Results:  No results found for this or any previous visit (from the past 48 hour(s)).   Blood Alcohol level:  Lab Results  Component Value Date   ETH <10 12/19/2022   ETH <10 11/21/2019    Metabolic Disorder Labs: Lab Results  Component Value Date   HGBA1C 4.4 (L) 12/21/2022   MPG 79.58 12/21/2022   No results found for: "PROLACTIN" Lab Results  Component Value Date   CHOL 218 (H) 12/21/2022   TRIG 81 12/21/2022   HDL 53 12/21/2022   CHOLHDL 4.1 12/21/2022   VLDL 16 12/21/2022   LDLCALC 149 (H) 12/21/2022   LDLCALC 110 (H) 03/13/2011    Physical Findings: AIMS: Facial and Oral Movements Muscles of Facial Expression: None Lips and Perioral Area: None Jaw: None Tongue: None,Extremity Movements Upper (arms, wrists, hands, fingers): None Lower (legs, knees, ankles, toes): None, Trunk Movements Neck, shoulders, hips: None, Global Judgements Severity of abnormal movements overall : None Incapacitation due to abnormal movements: None Patient's awareness of abnormal movements: No Awareness, Dental Status Current problems with teeth and/or dentures?: No Does patient usually wear dentures?: No  CIWA:    COWS:     Musculoskeletal: Strength & Muscle Tone: within normal limits Gait & Station: normal Patient leans: N/A  Psychiatric Specialty Exam:  Presentation  General Appearance:  Disheveled  Eye Contact: Fair  Speech: Slow  Speech Volume: Decreased  Handedness: Right   Mood and Affect  Mood: Dysphoric  Affect: Restricted   Thought Process  Thought Processes: Linear  Descriptions  of Associations:Intact  Orientation:Partial  Thought Content:Rumination  History of Schizophrenia/Schizoaffective disorder:Yes  Duration of Psychotic Symptoms:Greater than six months  Hallucinations:Hallucinations: None  Ideas of Reference:None  Suicidal Thoughts:Suicidal Thoughts: No  Homicidal Thoughts:Homicidal Thoughts: No   Sensorium  Memory: Recent Poor; Immediate Poor  Judgment: Poor  Insight: Poor   Executive Functions  Concentration: Poor  Attention Span: Fair  Recall: Poor  Fund of Knowledge: Poor  Language: Fair   Psychomotor Activity  Psychomotor Activity: Psychomotor Activity: Decreased   Assets  Assets: Leisure Time   Sleep  Sleep: Sleep: Good    Physical Exam: General: Sitting comfortably. NAD. HEENT: Normocephalic, atraumatic, MMM, EMOI Lungs: no increased work of breathing noted Heart: no cyanosis Abdomen: Non distended Musculoskeletal: FROM. No obvious deformities Skin: Warm, dry, intact. No rashes noted Neuro: No obvious focal deficits.  Gait and station are normal  Review of Systems  Constitutional: Negative.   HENT: Negative.    Eyes: Negative.   Respiratory: Negative.    Cardiovascular: Negative.   Gastrointestinal: Negative.   Genitourinary: Negative.   Skin: Negative.   Neurological: Negative.   Psychiatric/Behavioral:  Positive for depression.     Blood pressure (!) 96/52, pulse (!) 103, temperature 98.5 F (36.9 C), resp. rate 15, height 4\' 11"  (1.499 m), weight 63 kg, SpO2 99%. Body mass index is 28.05 kg/m.   Treatment Plan Summary: Daily contact with patient to assess and evaluate symptoms and progress in treatment and Medication management.    Still waiting on safe disposition as patient would not be able to live independently given cognitive impairment. Recommend assisted living.    No medication side effects reported.  Because of urinary incontinence, recommended adult diapers to be  worn.   Principal/active  diagnoses:  Schizoaffective disorder, depressive type (HCC)   Active Problems: GERD (gastroesophageal reflux disease) Intellectual disability Tobacco use disorder (MOCA 16/30) moderate cognitive impairment probably related to moderate intellectual disability.  Plan:  -Continue Imitrex 25 mg PRN BID for migraines -Continue Colace 100 mg daily for constipation -Continue Vitamin D 50.000 units weekly for bone health. -Continue Sertraline150 mg po Q daily for depression/anxiety -Continue Buspar 15 mg po bid for anxiety.  -Continue paliperidone 6 mg po qd for mood stabilization -Continue Trazodone 50 mg nightly for insomnia  -Continue Nicoderm 21 mg topically Q 24 hrs for nicotine withdrawal management. -Continue vitamin B12 1000 mcg IM weekly for 4 weeks then monthly -Continue Melatonin 5 mg nightly for sleep -Continue Protonix EC 40 mg p.o. daily for GERD -Continue MiraLAX 17 g p.o. PRN for constipation -Continue hydroxyzine 25 mg p.o. 3 times daily as needed for anxiety -Continue Ensure nutritional shakes TID in between meals  -Previously discontinued Hydroxyzine 50 mg nightly-hypotension in the mornings   Safety and Monitoring: Voluntary admission to inpatient psychiatric unit for safety, stabilization and treatment Daily contact with patient to assess and evaluate symptoms and progress in treatment Patient's case to be discussed in multi-disciplinary team meeting Observation Level : q15 minute checks Vital signs: q12 hours Precautions: Safety   Discharge Planning: Social work and case management to assist with discharge planning and identification of hospital follow-up needs prior to discharge Estimated LOS: Unknown at this time. Discharge Concerns: Need to establish a safety plan; Medication compliance and effectiveness Discharge Goals: Return home with outpatient referrals for mental health follow-up including medication management/psychotherapy No  legal guardian, has payee  Golda Acre, MD 05/22/2023, 11:51 AM

## 2023-05-22 NOTE — Progress Notes (Signed)
D- Patient alert and oriented, sad and tearful on approach, she stated "I just will not eat and I will die".Pt.denies active SI, HI, plan or intent she also denied having AVH, or pain. A- Scheduled medications administered to patient, per MD orders. Support and encouragement provided.  Routine safety checks conducted every 15 minutes.  Patient informed to notify staff with problems or concerns. R- No adverse drug reactions noted. Patient contracts for safety at this time. Patient compliant with medications and treatment plan. Patient receptive, calm, and cooperative. Patient interacts well with others on the unit.  Patient remains safe at this time.

## 2023-05-22 NOTE — Plan of Care (Signed)
  Problem: Coping: Goal: Coping ability will improve Outcome: Progressing Goal: Will verbalize feelings Outcome: Progressing   

## 2023-05-23 MED ORDER — WHITE PETROLATUM EX OINT
TOPICAL_OINTMENT | CUTANEOUS | Status: AC
  Filled 2023-05-23: qty 5

## 2023-05-23 MED ORDER — WHITE PETROLATUM EX OINT
TOPICAL_OINTMENT | CUTANEOUS | Status: AC
  Administered 2023-05-23: 1
  Filled 2023-05-23: qty 5

## 2023-05-23 NOTE — Progress Notes (Signed)
Patient ID: Yolanda [name], female   DOB: Sep 26, 1975, 47 y.o.   MRN: [id number] Pt remains boarding on unit in anticipation of discharge to group home facility. Yolanda [name] has been in bed most of am shift and upon approach denies any acute concerns but is noted to be tearful at times and expressed to staff that she feels lonely. She has not attended group this am and continues to require prompting to keep her room clean, hoarding snacks and cups etc.  Pt denies any SI HI . No signs pt is responding to internal stimuli. Pt is safe, will con't to monitor.

## 2023-05-23 NOTE — Progress Notes (Signed)
[facility] MD Progress Note  05/23/2023 12:36 PM Yolanda [name]  MRN:  [id number] Subjective: Per nursing report, patient was compliant with medication overnight.    Patient was seen in her room during rounds.  Her mood and affect were notably brighter today.  She reports that her daughter brought her grandson to visit yesterday which brightened her spirits.  She states that she plans to study for her GED today and possibly read the Bible.  I encouraged her to participate in groups as well.  She denied suicidal ideation, homicidal ideation, auditory hallucinations, or visual hallucinations.  Sleep and appetite are adequate.  Patient is not capable of living independently and social work continues to wait on group home to finalize financial details for placement.    Principal Problem: Schizoaffective disorder, depressive type (HCC) Diagnosis: Principal Problem:   Schizoaffective disorder, depressive type (HCC) Active Problems:   GERD (gastroesophageal reflux disease)   Intellectual disability   Tobacco use disorder  Total Time spent with patient: 20 minutes  Past Psychiatric History: see H&P  Past Medical History:  Past Medical History:  Diagnosis Date   Bipolar affect, depressed (HCC)    Constipation 08/17/2022   Depression    Falls 07/21/2022   Fracture of femoral neck, right, closed (HCC) 01/17/2022   Herpes simplex 08/22/2017   Open fracture dislocation of right elbow joint 01/17/2022    Past Surgical History:  Procedure Laterality Date   NO PAST SURGERIES     SALPINGECTOMY     Family History: History reviewed. No pertinent family history. Family Psychiatric  History: see H&P Social History:  Social History   Substance and Sexual Activity  Alcohol Use Yes     Social History   Substance and Sexual Activity  Drug Use Yes   Types: Cocaine, Marijuana    Social History   Socioeconomic History   Marital status: Single    Spouse name: Not on file   Number of  children: Not on file   Years of education: Not on file   Highest education level: Not on file  Occupational History   Not on file  Tobacco Use   Smoking status: Every Day   Smokeless tobacco: Not on file  Substance and Sexual Activity   Alcohol use: Yes   Drug use: Yes    Types: Cocaine, Marijuana   Sexual activity: Yes  Other Topics Concern   Not on file  Social History Narrative   Not on file   Social Determinants of Health   Financial Resource Strain: Low Risk  (09/12/2022)   Received from [facility], [facility]   Overall Financial Resource Strain (CARDIA)    Difficulty of Paying Living Expenses: Not hard at all  Food Insecurity: Patient Declined (12/20/2022)   Hunger Vital Sign    Worried About Running Out of Food in the Last Year: Patient declined    Ran Out of Food in the Last Year: Patient declined  Transportation Needs: No Transportation Needs (12/20/2022)   PRAPARE - Administrator, Civil Service (Medical): No    Lack of Transportation (Non-Medical): No  Physical Activity: Not on file  Stress: No Stress Concern Present (07/17/2022)   Received from [facility] of Occupational [facility] - Occupational Stress Questionnaire    Feeling of Stress : Not at all  Social Connections: Unknown (07/16/2022)   Received from [facility], [facility]   Social Network    Social Network: Not on file  Additional Social History:                         Sleep: Good  Appetite:  Poor  Current Medications: Current Facility-Administered Medications  Medication Dose Route Frequency Provider Last Rate Last Admin   acetaminophen (TYLENOL) tablet 650 mg  650 mg Oral Q6H PRN [name], NP   650 mg at 05/18/23 1530   alum & mag hydroxide-simeth (MAALOX/MYLANTA) 200-200-20 MG/5ML suspension 30 mL  30 mL Oral Q4H PRN [name], NP   30 mL at 01/28/23 1530   busPIRone (BUSPAR) tablet 15 mg  15 mg Oral BID [name],  NP   15 mg at 05/23/23 0802   cyanocobalamin (VITAMIN B12) injection 1,000 mcg  1,000 mcg Intramuscular Q30 days Sarita Bottom, MD   1,000 mcg at 05/03/23 1257   diphenhydrAMINE (BENADRYL) capsule 50 mg  50 mg Oral TID PRN [name], NP   50 mg at 01/15/23 1211   Or   diphenhydrAMINE (BENADRYL) injection 50 mg  50 mg Intramuscular TID PRN [name], NP       docusate sodium (COLACE) capsule 100 mg  100 mg Oral Daily [name], NP   100 mg at 05/23/23 0802   feeding supplement (ENSURE ENLIVE / ENSURE PLUS) liquid 237 mL  237 mL Oral TID BM [name], NP   237 mL at 05/22/23 2113   haloperidol (HALDOL) tablet 5 mg  5 mg Oral TID PRN [name], NP   5 mg at 05/04/23 4401   Or   haloperidol lactate (HALDOL) injection 5 mg  5 mg Intramuscular TID PRN [name], NP       hydrOXYzine (ATARAX) tablet 25 mg  25 mg Oral TID PRN [name], DO   25 mg at 05/19/23 1218   LORazepam (ATIVAN) tablet 2 mg  2 mg Oral TID PRN [name], NP   2 mg at 01/15/23 1211   Or   LORazepam (ATIVAN) injection 2 mg  2 mg Intramuscular TID PRN [name], NP       magnesium hydroxide (MILK OF MAGNESIA) suspension 30 mL  30 mL Oral Daily PRN [name], NP   30 mL at 04/13/23 0814   melatonin tablet 5 mg  5 mg Oral QHS Nkwenti, Doris, NP   5 mg at 05/22/23 2111   nicotine polacrilex (NICORETTE) gum 2 mg  2 mg Oral PRN [name], MD   2 mg at 05/23/23 1212   paliperidone (INVEGA) 24 hr tablet 6 mg  6 mg Oral Daily [name], NP   6 mg at 05/22/23 2112   pantoprazole (PROTONIX) EC tablet 40 mg  40 mg Oral Daily [name], NP   40 mg at 05/23/23 0802   polyethylene glycol (MIRALAX / GLYCOLAX) packet 17 g  17 g Oral Daily PRN [name], NP   17 g at 04/13/23 1734   sertraline (ZOLOFT) tablet 150 mg  150 mg Oral Daily Massengill, Harrold Donath, MD   150 mg at 05/23/23 0802   SUMAtriptan (IMITREX) tablet 25 mg  25 mg Oral BID PRN [name], NP   25 mg at 05/07/23 2112    traZODone (DESYREL) tablet 50 mg  50 mg Oral QHS [name], NP   50 mg at 05/22/23 2111   Vitamin D (Ergocalciferol) (DRISDOL) 1.25 MG (50000 UNIT) capsule 50,000 Units  50,000 Units Oral Q7 days [name], NP   50,000  Units at 05/11/23 1431    Lab Results:  No results found for this or any previous visit (from the past 48 hour(s)).   Blood Alcohol level:  Lab Results  Component Value Date   ETH <10 12/19/2022   ETH <10 11/21/2019    Metabolic Disorder Labs: Lab Results  Component Value Date   HGBA1C 4.4 (L) 12/21/2022   MPG 79.58 12/21/2022   No results found for: "PROLACTIN" Lab Results  Component Value Date   CHOL 218 (H) 12/21/2022   TRIG 81 12/21/2022   HDL 53 12/21/2022   CHOLHDL 4.1 12/21/2022   VLDL 16 12/21/2022   LDLCALC 149 (H) 12/21/2022   LDLCALC 110 (H) 03/13/2011    Physical Findings: AIMS: Facial and Oral Movements Muscles of Facial Expression: None Lips and Perioral Area: None Jaw: None Tongue: None,Extremity Movements Upper (arms, wrists, hands, fingers): None Lower (legs, knees, ankles, toes): None, Trunk Movements Neck, shoulders, hips: None, Global Judgements Severity of abnormal movements overall : None Incapacitation due to abnormal movements: None Patient's awareness of abnormal movements: No Awareness, Dental Status Current problems with teeth and/or dentures?: No Does patient usually wear dentures?: No  CIWA:    COWS:     Musculoskeletal: Strength & Muscle Tone: within normal limits Gait & Station: normal Patient leans: N/A  Psychiatric Specialty Exam:  Presentation  General Appearance:  Disheveled  Eye Contact: Fair  Speech: Slow  Speech Volume: Decreased  Handedness: Right   Mood and Affect  Mood: Dysphoric  Affect: Restricted   Thought Process  Thought Processes: Linear  Descriptions of Associations:Intact  Orientation:Partial  Thought Content:Rumination  History of  Schizophrenia/Schizoaffective disorder:Yes  Duration of Psychotic Symptoms:Greater than six months  Hallucinations:Hallucinations: None  Ideas of Reference:None  Suicidal Thoughts:Suicidal Thoughts: No  Homicidal Thoughts:Homicidal Thoughts: No   Sensorium  Memory: Recent Poor; Immediate Poor  Judgment: Poor  Insight: Poor   Executive Functions  Concentration: Poor  Attention Span: Fair  Recall: Poor  Fund of Knowledge: Poor  Language: Fair   Psychomotor Activity  Psychomotor Activity: Psychomotor Activity: Decreased   Assets  Assets: Leisure Time   Sleep  Sleep: Sleep: Good    Physical Exam: General: Sitting comfortably. NAD. HEENT: Normocephalic, atraumatic, MMM, EMOI Lungs: no increased work of breathing noted Heart: no cyanosis Abdomen: Non distended Musculoskeletal: FROM. No obvious deformities Skin: Warm, dry, intact. No rashes noted Neuro: No obvious focal deficits.  Gait and station are normal  Review of Systems  Constitutional: Negative.   HENT: Negative.    Eyes: Negative.   Respiratory: Negative.    Cardiovascular: Negative.   Gastrointestinal: Negative.   Genitourinary: Negative.   Skin: Negative.   Neurological: Negative.   Psychiatric/Behavioral:  Positive for depression.     Blood pressure 110/75, pulse (!) 102, temperature 98.5 F (36.9 C), resp. rate 15, height 4\' 11"  (1.499 m), weight 63 kg, SpO2 99%. Body mass index is 28.05 kg/m.   Treatment Plan Summary: Daily contact with patient to assess and evaluate symptoms and progress in treatment and Medication management.    Still waiting on safe disposition as patient would not be able to live independently given cognitive impairment. Recommend assisted living.    No medication side effects reported.  Because of urinary incontinence, recommended adult diapers to be worn.   Principal/active diagnoses:  Schizoaffective disorder, depressive type (HCC)   Active  Problems: GERD (gastroesophageal reflux disease) Intellectual disability Tobacco use disorder (MOCA 16/30) moderate cognitive impairment probably related to moderate intellectual disability.  Plan:  -Continue Imitrex 25 mg PRN BID for migraines -Continue Colace 100 mg daily for constipation -Continue Vitamin D 50.000 units weekly for bone health. -Continue Sertraline150 mg po Q daily for depression/anxiety -Continue Buspar 15 mg po bid for anxiety.  -Continue paliperidone 6 mg po qd for mood stabilization -Continue Trazodone 50 mg nightly for insomnia  -Continue Nicoderm 21 mg topically Q 24 hrs for nicotine withdrawal management. -Continue vitamin B12 1000 mcg IM weekly for 4 weeks then monthly -Continue Melatonin 5 mg nightly for sleep -Continue Protonix EC 40 mg p.o. daily for GERD -Continue MiraLAX 17 g p.o. PRN for constipation -Continue hydroxyzine 25 mg p.o. 3 times daily as needed for anxiety -Continue Ensure nutritional shakes TID in between meals  -Previously discontinued Hydroxyzine 50 mg nightly-hypotension in the mornings   Safety and Monitoring: Voluntary admission to inpatient psychiatric unit for safety, stabilization and treatment Daily contact with patient to assess and evaluate symptoms and progress in treatment Patient's case to be discussed in multi-disciplinary team meeting Observation Level : q15 minute checks Vital signs: q12 hours Precautions: Safety   Discharge Planning: Social work and case management to assist with discharge planning and identification of hospital follow-up needs prior to discharge Estimated LOS: Unknown at this time. Discharge Concerns: Need to establish a safety plan; Medication compliance and effectiveness Discharge Goals: Return home with outpatient referrals for mental health follow-up including medication management/psychotherapy No legal guardian, has payee  Golda Acre, MD 05/23/2023, 12:36 PM

## 2023-05-23 NOTE — Group Note (Signed)
Date:  05/23/2023 Time:  10:56 AM  Group Topic/Focus:  Goals Group:   The focus of this group is to help patients establish daily goals to achieve during treatment and discuss how the patient can incorporate goal setting into their daily lives to aide in recovery. Orientation Group    Participation Level:  Did Not Attend  Participation Quality:    Affect:    Cognitive:    Insight:   Engagement in Group:    Modes of Intervention:    Additional Comments:    Keyshla Tunison D Fain Francis 05/23/2023, 10:56 AM

## 2023-05-23 NOTE — Progress Notes (Signed)
   05/23/23 2130  Psych Admission Type (Psych Patients Only)  Admission Status Involuntary  Psychosocial Assessment  Patient Complaints Crying spells;Depression  Eye Contact Fair  Facial Expression Anxious  Affect Appropriate to circumstance  Speech Soft  Interaction Childlike  Motor Activity Slow;Shuffling  Appearance/Hygiene Disheveled  Behavior Characteristics Cooperative;Unwilling to participate  Mood Anxious;Depressed  Aggressive Behavior  Effect No apparent injury  Thought Process  Coherency WDL  Content WDL  Delusions WDL  Perception WDL  Hallucination None reported or observed  Judgment Limited  Confusion WDL  Danger to Self  Current suicidal ideation? Denies

## 2023-05-23 NOTE — Plan of Care (Signed)

## 2023-05-23 NOTE — Plan of Care (Signed)
  Problem: Activity: Goal: Interest or engagement in leisure activities will improve Outcome: Progressing Goal: Imbalance in normal sleep/wake cycle will improve Outcome: Progressing   Problem: Coping: Goal: Coping ability will improve Outcome: Progressing Goal: Will verbalize feelings Outcome: Progressing   Problem: Safety: Goal: Periods of time without injury will increase Outcome: Progressing

## 2023-05-23 NOTE — Plan of Care (Signed)
  Problem: Activity: Goal: Interest or engagement in leisure activities will improve Outcome: Progressing Goal: Imbalance in normal sleep/wake cycle will improve Outcome: Progressing   Problem: Coping: Goal: Coping ability will improve 05/23/2023 2152 by Delos Haring, RN Outcome: Progressing 05/23/2023 2148 by Delos Haring, RN Outcome: Progressing Goal: Will verbalize feelings 05/23/2023 2152 by Delos Haring, RN Outcome: Progressing 05/23/2023 2148 by Delos Haring, RN Outcome: Progressing   Problem: Safety: Goal: Periods of time without injury will increase Outcome: Progressing

## 2023-05-23 NOTE — Group Note (Signed)
LCSW Group Therapy Note  Group Date: 05/23/2023 Start Time: 1100 End Time: 1200   Type of Therapy and Topic:  Group Therapy - Healthy vs Unhealthy Coping Skills  Participation Level:  Did Not Attend   Description of Group The focus of this group was to determine what unhealthy coping techniques typically are used by group members and what healthy coping techniques would be helpful in coping with various problems. Patients were guided in becoming aware of the differences between healthy and unhealthy coping techniques. Patients were asked to identify 2-3 healthy coping skills they would like to learn to use more effectively.  Therapeutic Goals Patients learned that coping is what human beings do all day long to deal with various situations in their lives Patients defined and discussed healthy vs unhealthy coping techniques Patients identified their preferred coping techniques and identified whether these were healthy or unhealthy Patients determined 2-3 healthy coping skills they would like to become more familiar with and use more often. Patients provided support and ideas to each other   Summary of Patient Progress:  Did not attend   Therapeutic Modalities Cognitive Behavioral Therapy Motivational Interviewing  Marinda Elk, [place] 05/23/2023  1:47 PM

## 2023-05-23 NOTE — [facility] Group Notes (Signed)
Adult Psychoeducational Group Note  Date:  05/23/2023 Time:  10:37 PM  Group Topic/Focus:  Wrap-Up Group:   The focus of this group is to help patients review their daily goal of treatment and discuss progress on daily workbooks.  Participation Level:  Active  Participation Quality:  Attentive  Affect:  Appropriate  Cognitive:  Alert  Insight: Improving  Engagement in Group:  Engaged  Modes of Intervention:  Discussion  Additional Comments:  Pt attended and participated in group.  Maura Crandall Cassandra 05/23/2023, 10:37 PM

## 2023-05-24 ENCOUNTER — Encounter (HOSPITAL_COMMUNITY): Payer: Self-pay

## 2023-05-24 NOTE — Progress Notes (Signed)
[facility] MD Progress Note  05/24/2023 1:16 PM Yolanda [name]  MRN:  [id number] Subjective: Per nursing report, patient was compliant with medication overnight.    Yolanda [name] was seen in her room during rounds.  She reported that her mood was "good" today.  She denies any new psychiatric or medical complaints.  She reports that her appetite is good.  She reports that focus and concentration are adequate.  She denies issues with energy.  She reports adequate sleep.  She denies any medication side effects.  She denies suicidal ideations, homicidal ideations, auditory hallucinations, visual hallucinations, or delusions.   Patient is not capable of living independently and social work continues to wait on group home to finalize financial details for placement.    Principal Problem: Schizoaffective disorder, depressive type (HCC) Diagnosis: Principal Problem:   Schizoaffective disorder, depressive type (HCC) Active Problems:   GERD (gastroesophageal reflux disease)   Intellectual disability   Tobacco use disorder  Total Time spent with patient: 20 minutes  Past Psychiatric History: see H&P  Past Medical History:  Past Medical History:  Diagnosis Date   Bipolar affect, depressed (HCC)    Constipation 08/17/2022   Depression    Falls 07/21/2022   Fracture of femoral neck, right, closed (HCC) 01/17/2022   Herpes simplex 08/22/2017   Open fracture dislocation of right elbow joint 01/17/2022    Past Surgical History:  Procedure Laterality Date   NO PAST SURGERIES     SALPINGECTOMY     Family History: History reviewed. No pertinent family history. Family Psychiatric  History: see H&P Social History:  Social History   Substance and Sexual Activity  Alcohol Use Yes     Social History   Substance and Sexual Activity  Drug Use Yes   Types: Cocaine, Marijuana    Social History   Socioeconomic History   Marital status: Single    Spouse name: Not on file   Number of children: Not  on file   Years of education: Not on file   Highest education level: Not on file  Occupational History   Not on file  Tobacco Use   Smoking status: Every Day   Smokeless tobacco: Not on file  Substance and Sexual Activity   Alcohol use: Yes   Drug use: Yes    Types: Cocaine, Marijuana   Sexual activity: Yes  Other Topics Concern   Not on file  Social History Narrative   Not on file   Social Determinants of Health   Financial Resource Strain: Low Risk  (09/12/2022)   Received from [facility], [facility]   Overall Financial Resource Strain (CARDIA)    Difficulty of Paying Living Expenses: Not hard at all  Food Insecurity: Patient Declined (12/20/2022)   Hunger Vital Sign    Worried About Running Out of Food in the Last Year: Patient declined    Ran Out of Food in the Last Year: Patient declined  Transportation Needs: No Transportation Needs (12/20/2022)   PRAPARE - Administrator, Civil Service (Medical): No    Lack of Transportation (Non-Medical): No  Physical Activity: Not on file  Stress: No Stress Concern Present (07/17/2022)   Received from [facility], [facility] - Occupational Stress Questionnaire    Feeling of Stress : Not at all  Social Connections: Unknown (07/16/2022)   Received from [facility], [facility]   Social Network    Social Network: Not on file   Additional  Social History:                         Sleep: Good  Appetite:  Poor  Current Medications: Current Facility-Administered Medications  Medication Dose Route Frequency Provider Last Rate Last Admin   acetaminophen (TYLENOL) tablet 650 mg  650 mg Oral Q6H PRN [name], NP   650 mg at 05/18/23 1530   alum & mag hydroxide-simeth (MAALOX/MYLANTA) 200-200-20 MG/5ML suspension 30 mL  30 mL Oral Q4H PRN [name], NP   30 mL at 01/28/23 1530   busPIRone (BUSPAR) tablet 15 mg  15 mg Oral BID [name], NP   15 mg at  05/24/23 0916   cyanocobalamin (VITAMIN B12) injection 1,000 mcg  1,000 mcg Intramuscular Q30 days Sarita Bottom, MD   1,000 mcg at 05/03/23 1257   diphenhydrAMINE (BENADRYL) capsule 50 mg  50 mg Oral TID PRN [name], NP   50 mg at 01/15/23 1211   Or   diphenhydrAMINE (BENADRYL) injection 50 mg  50 mg Intramuscular TID PRN [name], NP       docusate sodium (COLACE) capsule 100 mg  100 mg Oral Daily [name], NP   100 mg at 05/24/23 0916   feeding supplement (ENSURE ENLIVE / ENSURE PLUS) liquid 237 mL  237 mL Oral TID BM [name], NP   237 mL at 05/23/23 2123   haloperidol (HALDOL) tablet 5 mg  5 mg Oral TID PRN [name], NP   5 mg at 05/04/23 8413   Or   haloperidol lactate (HALDOL) injection 5 mg  5 mg Intramuscular TID PRN [name], NP       hydrOXYzine (ATARAX) tablet 25 mg  25 mg Oral TID PRN [name], DO   25 mg at 05/19/23 1218   LORazepam (ATIVAN) tablet 2 mg  2 mg Oral TID PRN [name], NP   2 mg at 01/15/23 1211   Or   LORazepam (ATIVAN) injection 2 mg  2 mg Intramuscular TID PRN [name], NP       magnesium hydroxide (MILK OF MAGNESIA) suspension 30 mL  30 mL Oral Daily PRN [name], NP   30 mL at 04/13/23 0814   melatonin tablet 5 mg  5 mg Oral QHS Nkwenti, Doris, NP   5 mg at 05/23/23 2110   nicotine polacrilex (NICORETTE) gum 2 mg  2 mg Oral PRN [name], MD   2 mg at 05/23/23 1952   paliperidone (INVEGA) 24 hr tablet 6 mg  6 mg Oral Daily [name], NP   6 mg at 05/23/23 2110   pantoprazole (PROTONIX) EC tablet 40 mg  40 mg Oral Daily [name], NP   40 mg at 05/24/23 0916   polyethylene glycol (MIRALAX / GLYCOLAX) packet 17 g  17 g Oral Daily PRN [name], NP   17 g at 04/13/23 1734   sertraline (ZOLOFT) tablet 150 mg  150 mg Oral Daily Massengill, Harrold Donath, MD   150 mg at 05/24/23 0916   SUMAtriptan (IMITREX) tablet 25 mg  25 mg Oral BID PRN [name], NP   25 mg at 05/07/23 2112   traZODone  (DESYREL) tablet 50 mg  50 mg Oral QHS [name], NP   50 mg at 05/23/23 2110   Vitamin D (Ergocalciferol) (DRISDOL) 1.25 MG (50000 UNIT) capsule 50,000 Units  50,000 Units Oral Q7 days [name], NP   50,000 Units  at 05/11/23 1431    Lab Results:  No results found for this or any previous visit (from the past 48 hour(s)).   Blood Alcohol level:  Lab Results  Component Value Date   ETH <10 12/19/2022   ETH <10 11/21/2019    Metabolic Disorder Labs: Lab Results  Component Value Date   HGBA1C 4.4 (L) 12/21/2022   MPG 79.58 12/21/2022   No results found for: "PROLACTIN" Lab Results  Component Value Date   CHOL 218 (H) 12/21/2022   TRIG 81 12/21/2022   HDL 53 12/21/2022   CHOLHDL 4.1 12/21/2022   VLDL 16 12/21/2022   LDLCALC 149 (H) 12/21/2022   LDLCALC 110 (H) 03/13/2011    Physical Findings: AIMS: Facial and Oral Movements Muscles of Facial Expression: None Lips and Perioral Area: None Jaw: None Tongue: None,Extremity Movements Upper (arms, wrists, hands, fingers): None Lower (legs, knees, ankles, toes): None, Trunk Movements Neck, shoulders, hips: None, Global Judgements Severity of abnormal movements overall : None Incapacitation due to abnormal movements: None Patient's awareness of abnormal movements: No Awareness, Dental Status Current problems with teeth and/or dentures?: No Does patient usually wear dentures?: No  CIWA:    COWS:     Musculoskeletal: Strength & Muscle Tone: within normal limits Gait & Station: normal Patient leans: N/A  Psychiatric Specialty Exam:  Presentation  General Appearance:  Disheveled  Eye Contact: Fair  Speech: Clear and Coherent  Speech Volume: Decreased  Handedness: Right   Mood and Affect  Mood: Euthymic  Affect: Constricted   Thought Process  Thought Processes: Linear  Descriptions of Associations:Intact  Orientation:Partial  Thought Content:Logical  History of  Schizophrenia/Schizoaffective disorder:Yes  Duration of Psychotic Symptoms:Greater than six months  Hallucinations:Hallucinations: None  Ideas of Reference:None  Suicidal Thoughts:Suicidal Thoughts: No  Homicidal Thoughts:Homicidal Thoughts: No   Sensorium  Memory: Immediate Fair; Recent Poor  Judgment: Poor  Insight: Poor   Executive Functions  Concentration: Poor  Attention Span: Fair  Recall: Poor  Fund of Knowledge: Poor  Language: Fair   Psychomotor Activity  Psychomotor Activity: Psychomotor Activity: Normal   Assets  Assets: Leisure Time   Sleep  Sleep: Sleep: Good    Physical Exam: General: Sitting comfortably. NAD. HEENT: Normocephalic, atraumatic, MMM, EMOI Lungs: no increased work of breathing noted Heart: no cyanosis Abdomen: Non distended Musculoskeletal: FROM. No obvious deformities Skin: Warm, dry, intact. No rashes noted Neuro: No obvious focal deficits.  Gait and station are normal  Review of Systems  Constitutional: Negative.   HENT: Negative.    Eyes: Negative.   Respiratory: Negative.    Cardiovascular: Negative.   Gastrointestinal: Negative.   Genitourinary: Negative.   Skin: Negative.   Neurological: Negative.   Psychiatric/Behavioral:  Positive for depression.     Blood pressure 108/66, pulse 65, temperature 98.5 F (36.9 C), resp. rate 15, height 4\' 11"  (1.499 m), weight 63 kg, SpO2 99%. Body mass index is 28.05 kg/m.   Treatment Plan Summary: Daily contact with patient to assess and evaluate symptoms and progress in treatment and Medication management.    Still waiting on safe disposition as patient would not be able to live independently given cognitive impairment. Recommend assisted living.    No medication side effects reported.  Because of urinary incontinence, recommended adult diapers to be worn.   Principal/active diagnoses:  Schizoaffective disorder, depressive type (HCC)   Active  Problems: GERD (gastroesophageal reflux disease) Intellectual disability Tobacco use disorder (MOCA 16/30) moderate cognitive impairment probably related to moderate intellectual disability.  Plan:  -Continue Imitrex 25 mg PRN BID for migraines -Continue Colace 100 mg daily for constipation -Continue Vitamin D 50.000 units weekly for bone health. -Continue Sertraline150 mg po Q daily for depression/anxiety -Continue Buspar 15 mg po bid for anxiety.  -Continue paliperidone 6 mg po qd for mood stabilization -Continue Trazodone 50 mg nightly for insomnia  -Continue Nicoderm 21 mg topically Q 24 hrs for nicotine withdrawal management. -Continue vitamin B12 1000 mcg IM weekly for 4 weeks then monthly -Continue Melatonin 5 mg nightly for sleep -Continue Protonix EC 40 mg p.o. daily for GERD -Continue MiraLAX 17 g p.o. PRN for constipation -Continue hydroxyzine 25 mg p.o. 3 times daily as needed for anxiety -Continue Ensure nutritional shakes TID in between meals  -Previously discontinued Hydroxyzine 50 mg nightly-hypotension in the mornings   Safety and Monitoring: Voluntary admission to inpatient psychiatric unit for safety, stabilization and treatment Daily contact with patient to assess and evaluate symptoms and progress in treatment Patient's case to be discussed in multi-disciplinary team meeting Observation Level : q15 minute checks Vital signs: q12 hours Precautions: Safety   Discharge Planning: Social work and case management to assist with discharge planning and identification of hospital follow-up needs prior to discharge Estimated LOS: Unknown at this time. Discharge Concerns: Need to establish a safety plan; Medication compliance and effectiveness Discharge Goals: Return home with outpatient referrals for mental health follow-up including medication management/psychotherapy No legal guardian, has payee  Golda Acre, MD 05/24/2023, 1:16 PM

## 2023-05-24 NOTE — [facility] Group Notes (Signed)
Spiritual care group facilitated by Chaplain Katy Denessa Cavan, B[facility]  Group focused on topic of strength. Group members reflected on what thoughts and feelings emerge when they hear this topic. They then engaged in facilitated dialog around how strength is present in their lives. This dialog focused on representing what strength had been to them in their lives (images and patterns given) and what they saw as helpful in their life now (what they needed / wanted).  Activity drew on narrative framework.  Patient Progress: Did not attend.  

## 2023-05-24 NOTE — Progress Notes (Signed)
   05/24/23 0900  Psych Admission Type (Psych Patients Only)  Admission Status Involuntary  Psychosocial Assessment  Patient Complaints Depression  Eye Contact Fair  Facial Expression Anxious;Sad  Affect Appropriate to circumstance  Speech Soft  Interaction Childlike  Motor Activity Slow  Appearance/Hygiene Disheveled  Behavior Characteristics Cooperative  Mood Depressed;Sad  Thought Process  Coherency WDL  Content WDL  Delusions WDL  Perception WDL  Hallucination None reported or observed  Judgment Impaired  Confusion None  Danger to Self  Current suicidal ideation? Denies  Description of Suicide Plan No plan  Self-Injurious Behavior No self-injurious ideation or behavior indicators observed or expressed   Agreement Not to Harm Self Yes  Description of Agreement Verbal  Danger to Others  Danger to Others None reported or observed

## 2023-05-24 NOTE — Progress Notes (Signed)
   05/24/23 2015  Psych Admission Type (Psych Patients Only)  Admission Status Involuntary  Psychosocial Assessment  Patient Complaints Depression  Eye Contact Fair  Facial Expression Anxious  Affect Appropriate to circumstance  Speech Soft  Interaction Childlike  Motor Activity Slow;Shuffling  Appearance/Hygiene Disheveled  Behavior Characteristics Cooperative  Mood Depressed;Sad  Aggressive Behavior  Effect No apparent injury  Thought Process  Coherency WDL  Content WDL  Delusions WDL  Perception WDL  Hallucination None reported or observed  Judgment Limited  Confusion WDL  Danger to Self  Current suicidal ideation? Denies

## 2023-05-24 NOTE — Plan of Care (Signed)
  Problem: Coping: Goal: Coping ability will improve Outcome: Progressing Goal: Will verbalize feelings Outcome: Progressing   

## 2023-05-24 NOTE — Group Note (Signed)
Recreation Therapy Group Note   Group Topic:Problem Solving  Group Date: 05/24/2023 Start Time: 0945 End Time: 1005 Facilitators: [name] Dhami-McCall, LRT,CTRS Location: [address] Dayroom   Group Topic: Communication, Team Building, Problem Solving  Goal Area(s) Addresses:  Patient will effectively work with peer towards shared goal.  Patient will identify skills used to make activity successful.  Patient will share challenges and verbalize solution-driven approaches used. Patient will identify how skills used during activity can be used to reach post d/c goals.   Intervention: STEM Activity   Group Description: [employer]. Patients were provided the following materials: 5 drinking straws, 5 rubber bands, 5 paper clips, 2 index cards and 2 drinking cups. Using the provided materials patients were asked to build a launching mechanism to launch a ping pong ball across the room, approximately 10 feet. Patients were divided into teams of 3-5. Instructions required all materials be incorporated into the device, functionality of items left to the peer group's discretion.  Education: Pharmacist, community, Scientist, physiological, Air cabin crew, Building control surveyor.   Education Outcome: Acknowledges education/In group clarification offered/Needs additional education.    Affect/Mood: N/A   Participation Level: Did not attend    Clinical Observations/Individualized Feedback:     Plan: Continue to engage patient in RT group sessions 2-3x/week.   Ordean Fouts-McCall, LRT,CTRS 05/24/2023 1:04 PM

## 2023-05-24 NOTE — BH IP Treatment Plan (Signed)
Interdisciplinary Treatment and Diagnostic Plan Update  05/24/2023 Time of Session: 9:30 AM ( update )  Yolanda [name] MRN: [id number]  Principal Diagnosis: Schizoaffective disorder, depressive type (HCC)  Secondary Diagnoses: Principal Problem:   Schizoaffective disorder, depressive type (HCC) Active Problems:   GERD (gastroesophageal reflux disease)   Intellectual disability   Tobacco use disorder   Current Medications:  Current Facility-Administered Medications  Medication Dose Route Frequency Provider Last Rate Last Admin   acetaminophen (TYLENOL) tablet 650 mg  650 mg Oral Q6H PRN [name], NP   650 mg at 05/18/23 1530   alum & mag hydroxide-simeth (MAALOX/MYLANTA) 200-200-20 MG/5ML suspension 30 mL  30 mL Oral Q4H PRN [name], NP   30 mL at 01/28/23 1530   busPIRone (BUSPAR) tablet 15 mg  15 mg Oral BID [name], NP   15 mg at 05/24/23 0916   cyanocobalamin (VITAMIN B12) injection 1,000 mcg  1,000 mcg Intramuscular Q30 days Sarita Bottom, MD   1,000 mcg at 05/03/23 1257   diphenhydrAMINE (BENADRYL) capsule 50 mg  50 mg Oral TID PRN [name], NP   50 mg at 01/15/23 1211   Or   diphenhydrAMINE (BENADRYL) injection 50 mg  50 mg Intramuscular TID PRN [name], NP       docusate sodium (COLACE) capsule 100 mg  100 mg Oral Daily [name], NP   100 mg at 05/24/23 0916   feeding supplement (ENSURE ENLIVE / ENSURE PLUS) liquid 237 mL  237 mL Oral TID BM [name], NP   237 mL at 05/23/23 2123   haloperidol (HALDOL) tablet 5 mg  5 mg Oral TID PRN [name], NP   5 mg at 05/04/23 7829   Or   haloperidol lactate (HALDOL) injection 5 mg  5 mg Intramuscular TID PRN [name], NP       hydrOXYzine (ATARAX) tablet 25 mg  25 mg Oral TID PRN [name], DO   25 mg at 05/19/23 1218   LORazepam (ATIVAN) tablet 2 mg  2 mg Oral TID PRN [name], NP   2 mg at 01/15/23 1211   Or   LORazepam (ATIVAN) injection 2 mg  2 mg Intramuscular TID PRN  [name], NP       magnesium hydroxide (MILK OF MAGNESIA) suspension 30 mL  30 mL Oral Daily PRN [name], NP   30 mL at 04/13/23 0814   melatonin tablet 5 mg  5 mg Oral QHS Nkwenti, Doris, NP   5 mg at 05/23/23 2110   nicotine polacrilex (NICORETTE) gum 2 mg  2 mg Oral PRN [name], MD   2 mg at 05/24/23 1316   paliperidone (INVEGA) 24 hr tablet 6 mg  6 mg Oral Daily Nkwenti, Tyler Aas, NP   6 mg at 05/23/23 2110   pantoprazole (PROTONIX) EC tablet 40 mg  40 mg Oral Daily Nwoko, Nicole Kindred I, NP   40 mg at 05/24/23 0916   polyethylene glycol (MIRALAX / GLYCOLAX) packet 17 g  17 g Oral Daily PRN [name], NP   17 g at 04/13/23 1734   sertraline (ZOLOFT) tablet 150 mg  150 mg Oral Daily Massengill, Harrold Donath, MD   150 mg at 05/24/23 0916   SUMAtriptan (IMITREX) tablet 25 mg  25 mg Oral BID PRN [name], NP   25 mg at 05/07/23 2112   traZODone (DESYREL) tablet 50 mg  50 mg Oral QHS [name], NP   50 mg at  05/23/23 2110   Vitamin D (Ergocalciferol) (DRISDOL) 1.25 MG (50000 UNIT) capsule 50,000 Units  50,000 Units Oral Q7 days [name], NP   50,000 Units at 05/11/23 1431   PTA Medications: Medications Prior to Admission  Medication Sig Dispense Refill Last Dose   busPIRone (BUSPAR) 15 MG tablet Take 15 mg by mouth 2 (two) times daily. (Patient not taking: Reported on 12/19/2022)      paliperidone (INVEGA SUSTENNA) 156 MG/ML SUSY injection Inject 156 mg into the muscle once. (Patient not taking: Reported on 12/19/2022)      sertraline (ZOLOFT) 50 MG tablet Take 150 mg by mouth daily. (Patient not taking: Reported on 12/19/2022)      traZODone (DESYREL) 100 MG tablet Take 100 mg by mouth at bedtime as needed for sleep. (Patient not taking: Reported on 11/12/2022)       Patient Stressors: Medication change or noncompliance    Patient Strengths: Forensic psychologist fund of knowledge   Treatment Modalities: Medication Management, Group therapy, Case management,  1  to 1 session with clinician, Psychoeducation, Recreational therapy.   Physician Treatment Plan for Primary Diagnosis: Schizoaffective disorder, depressive type (HCC) Long Term Goal(s): Improvement in symptoms so as ready for discharge   Short Term Goals: Ability to identify and develop effective coping behaviors will improve Ability to maintain clinical measurements within normal limits will improve Compliance with prescribed medications will improve Ability to identify triggers associated with substance abuse/mental health issues will improve Ability to identify changes in lifestyle to reduce recurrence of condition will improve Ability to verbalize feelings will improve Ability to disclose and discuss suicidal ideas Ability to demonstrate self-control will improve  Medication Management: Evaluate patient's response, side effects, and tolerance of medication regimen.  Therapeutic Interventions: 1 to 1 sessions, Unit Group sessions and Medication administration.  Evaluation of Outcomes: Progressing  Physician Treatment Plan for Secondary Diagnosis: Principal Problem:   Schizoaffective disorder, depressive type (HCC) Active Problems:   GERD (gastroesophageal reflux disease)   Intellectual disability   Tobacco use disorder  Long Term Goal(s): Improvement in symptoms so as ready for discharge   Short Term Goals: Ability to identify and develop effective coping behaviors will improve Ability to maintain clinical measurements within normal limits will improve Compliance with prescribed medications will improve Ability to identify triggers associated with substance abuse/mental health issues will improve Ability to identify changes in lifestyle to reduce recurrence of condition will improve Ability to verbalize feelings will improve Ability to disclose and discuss suicidal ideas Ability to demonstrate self-control will improve     Medication Management: Evaluate patient's response,  side effects, and tolerance of medication regimen.  Therapeutic Interventions: 1 to 1 sessions, Unit Group sessions and Medication administration.  Evaluation of Outcomes: Progressing   RN Treatment Plan for Primary Diagnosis: Schizoaffective disorder, depressive type (HCC) Long Term Goal(s): Knowledge of disease and therapeutic regimen to maintain health will improve  Short Term Goals: Ability to remain free from injury will improve, Ability to verbalize frustration and anger appropriately will improve, Ability to participate in decision making will improve, Ability to verbalize feelings will improve, Ability to identify and develop effective coping behaviors will improve, and Compliance with prescribed medications will improve  Medication Management: RN will administer medications as ordered by provider, will assess and evaluate patient's response and provide education to patient for prescribed medication. RN will report any adverse and/or side effects to prescribing provider.  Therapeutic Interventions: 1 on 1 counseling sessions, Psychoeducation, Medication administration, Evaluate responses  to treatment, Monitor vital signs and CBGs as ordered, Perform/monitor CIWA, COWS, AIMS and Fall Risk screenings as ordered, Perform wound care treatments as ordered.  Evaluation of Outcomes: Progressing   LCSW Treatment Plan for Primary Diagnosis: Schizoaffective disorder, depressive type (HCC) Long Term Goal(s): Safe transition to appropriate next level of care at discharge, Engage patient in therapeutic group addressing interpersonal concerns.  Short Term Goals: Engage patient in aftercare planning with referrals and resources, Increase social support, Increase emotional regulation, Facilitate acceptance of mental health diagnosis and concerns, Identify triggers associated with mental health/substance abuse issues, and Increase skills for wellness and recovery  Therapeutic Interventions: Assess for  all discharge needs, 1 to 1 time with Social worker, Explore available resources and support systems, Assess for adequacy in community support network, Educate family and significant other(s) on suicide prevention, Complete Psychosocial Assessment, Interpersonal group therapy.  Evaluation of Outcomes: Progressing   Progress in Treatment: Attending groups: Yes. Participating in groups: Yes. Taking medication as prescribed: Yes. Toleration medication: Yes. Family/Significant other contact made: Yes, individual(s) contacted:  DSS ( [place] guardian )  Patient understands diagnosis: Yes. Discussing patient identified problems/goals with staff: Yes. Medical problems stabilized or resolved: Yes. Denies suicidal/homicidal ideation: Yes. Issues/concerns per patient self-inventory: No.   New problem(s) identified: No, Describe:  None reported    New Short Term/Long Term Goal(s):   medication stabilization, elimination of SI thoughts, development of comprehensive mental wellness plan.     Patient Goals:  " placement "    Discharge Plan or Barriers: Patient recently admitted. CSW will continue to follow and assess for appropriate referrals and possible discharge planning.  Pt is awaiting to be placed at [facility] in [place]    Reason for Continuation of Hospitalization: Anxiety Depression Medication stabilization Other; describe Placement    Estimated Length of Stay: waiting on her Medicaid B to be converted to Med C    Last 3 [place] Suicide Severity Risk Score: Flowsheet Row Ad[facility] (Current) [facility] from 12/19/2022 in [facility] Department at [facility] ED from 11/12/2022 in [facility]  C-SSRS RISK CATEGORY High Risk High Risk Moderate Risk       Last [facility] 2/9 Scores:     No data to display          Scribe for Treatment Team: Beather Arbour 05/24/2023 1:52 PM

## 2023-05-24 NOTE — Plan of Care (Signed)
  Problem: Coping: Goal: Coping ability will improve Outcome: Progressing Goal: Will verbalize feelings Outcome: Progressing   Problem: Activity: Goal: Interest or engagement in leisure activities will improve Outcome: Not Progressing

## 2023-05-24 NOTE — Progress Notes (Signed)
[facility]U LCSW Progress Note   05/24/2023    1:16 PM  Cree D Canizalez      Type of Note: Visit with patient Thursday    CSW spoke with patient yesterday morning regarding speaking to her DSS caseworker since she shared that she did not want to go to [facility] wellness anymore , instead she wants to go stay with a staff member here . CSW shared with patient that it was a conflicts on interest and that it would be up to her DSS worker since they are managing everything and have to do other steps in order to approve something like that. Patient then started to get upset and tearful , she walked away. CSW asked for patient to come back and continued on with why it was not up to [place] to make the decision for her to DC with the staff member; Pt had just nodded her head. CSW today, 05/24/2023 did  provide pt with her DSS worker information to talk to her about it. CSW will continue to assist.     Signed:   Jacob Moores, MSW, LCSWA 05/24/2023 1:16 PM

## 2023-05-24 NOTE — [facility] Group Notes (Signed)
Pt did not attend AA meeting tonight.

## 2023-05-25 DIAGNOSIS — F251 Schizoaffective disorder, depressive type: Secondary | ICD-10-CM | POA: Diagnosis not present

## 2023-05-25 NOTE — Group Note (Deleted)
Date:  05/25/2023 Time:  12:13 PM  Group Topic/Focus:  Goals Group:   The focus of this group is to help patients establish daily goals to achieve during treatment and discuss how the patient can incorporate goal setting into their daily lives to aide in recovery.     Participation Level:  {BHH PARTICIPATION [id number]:47829}  Participation Quality:  {BHH PARTICIPATION QUALITY:22265}  Affect:  {BHH AFFECT:22266}  Cognitive:  {BHH COGNITIVE:22267}  Insight: {BHH Insight2:20797}  Engagement in Group:  {BHH ENGAGEMENT IN FAOZH:08657}  Modes of Intervention:  {BHH MODES OF INTERVENTION:22269}  Additional Comments:  ***  Deforest Hoyles Charlette Hennings 05/25/2023, 12:13 PM

## 2023-05-25 NOTE — Group Note (Unsigned)
Date:  05/26/2023 Time:  12:24 AM  Group Topic/Focus:  Wrap-Up Group:   The focus of this group is to help patients review their daily goal of treatment and discuss progress on daily workbooks.    Participation Level:  Active  Participation Quality:  Appropriate and Sharing  Affect:  Appropriate  Cognitive:  Appropriate  Insight: Appropriate  Engagement in Group:  Engaged  Modes of Intervention:  Activity and Socialization  Additional Comments:  Patient stated that she had a "so-so day". Patient stated that she slept and study for her GED. Patient stated that she was "proactive" today and attended groups and meals. Patient rated her day a 7/10. Patient participated in activity after sharing.   Yolanda [name] 05/26/2023, 12:24 AM

## 2023-05-25 NOTE — Plan of Care (Signed)
  Problem: Education: Goal: Utilization of techniques to improve thought processes will improve Outcome: Progressing Goal: Knowledge of the prescribed therapeutic regimen will improve Outcome: Progressing   Problem: Activity: Goal: Interest or engagement in leisure activities will improve Outcome: Progressing   

## 2023-05-25 NOTE — Progress Notes (Signed)
[facility] MD Progress Note  05/25/2023 12:00 PM Yolanda [name]  MRN:  [id number] Subjective: Per nursing report, patient was compliant with medication overnight.    Garnell was seen in her room during rounds.  She reported that her mood was "happy sad" today.  When asked to further articulate, the patient was unable to explain.  She denies any new psychiatric or medical complaints.  She reports that her appetite is good.  She reports that focus and concentration are adequate.  She denies issues with energy.  She reports adequate sleep.  She denies any medication side effects.  She denies suicidal ideations, homicidal ideations, auditory hallucinations, visual hallucinations, or delusions.  She reports that she is going to study for her GED and read the Bible today.  I encouraged her to also participate in groups, though this is somewhat difficult for her due to her low functioning subsequent to intellectual disability.  Patient is not capable of living independently and social work continues to wait on group home to finalize financial details for placement.    Principal Problem: Schizoaffective disorder, depressive type (HCC) Diagnosis: Principal Problem:   Schizoaffective disorder, depressive type (HCC) Active Problems:   GERD (gastroesophageal reflux disease)   Intellectual disability   Tobacco use disorder  Total Time spent with patient: 20 minutes  Past Psychiatric History: see H&P  Past Medical History:  Past Medical History:  Diagnosis Date   Bipolar affect, depressed (HCC)    Constipation 08/17/2022   Depression    Falls 07/21/2022   Fracture of femoral neck, right, closed (HCC) 01/17/2022   Herpes simplex 08/22/2017   Open fracture dislocation of right elbow joint 01/17/2022    Past Surgical History:  Procedure Laterality Date   NO PAST SURGERIES     SALPINGECTOMY     Family History: History reviewed. No pertinent family history. Family Psychiatric  History: see  H&P Social History:  Social History   Substance and Sexual Activity  Alcohol Use Yes     Social History   Substance and Sexual Activity  Drug Use Yes   Types: Cocaine, Marijuana    Social History   Socioeconomic History   Marital status: Single    Spouse name: Not on file   Number of children: Not on file   Years of education: Not on file   Highest education level: Not on file  Occupational History   Not on file  Tobacco Use   Smoking status: Every Day   Smokeless tobacco: Not on file  Substance and Sexual Activity   Alcohol use: Yes   Drug use: Yes    Types: Cocaine, Marijuana   Sexual activity: Yes  Other Topics Concern   Not on file  Social History Narrative   Not on file   Social Determinants of Health   Financial Resource Strain: Low Risk  (09/12/2022)   Received from [facility], [facility]   Overall Financial Resource Strain (CARDIA)    Difficulty of Paying Living Expenses: Not hard at all  Food Insecurity: Patient Declined (12/20/2022)   Hunger Vital Sign    Worried About Running Out of Food in the Last Year: Patient declined    Ran Out of Food in the Last Year: Patient declined  Transportation Needs: No Transportation Needs (12/20/2022)   PRAPARE - Administrator, Civil Service (Medical): No    Lack of Transportation (Non-Medical): No  Physical Activity: Not on file  Stress: No Stress Concern Present (07/17/2022)   Received  from [facility], [facility]   [employer] of Occupational Health - Occupational Stress Questionnaire    Feeling of Stress : Not at all  Social Connections: Unknown (07/16/2022)   Received from [facility], [facility]   Social Network    Social Network: Not on file   Additional Social History:                         Sleep: Good  Appetite:  Poor  Current Medications: Current Facility-Administered Medications  Medication Dose Route Frequency Provider Last Rate Last Admin   acetaminophen  (TYLENOL) tablet 650 mg  650 mg Oral Q6H PRN [name], NP   650 mg at 05/18/23 1530   alum & mag hydroxide-simeth (MAALOX/MYLANTA) 200-200-20 MG/5ML suspension 30 mL  30 mL Oral Q4H PRN [name], NP   30 mL at 01/28/23 1530   busPIRone (BUSPAR) tablet 15 mg  15 mg Oral BID [name], NP   15 mg at 05/25/23 0831   cyanocobalamin (VITAMIN B12) injection 1,000 mcg  1,000 mcg Intramuscular Q30 days Sarita Bottom, MD   1,000 mcg at 05/03/23 1257   diphenhydrAMINE (BENADRYL) capsule 50 mg  50 mg Oral TID PRN [name], NP   50 mg at 01/15/23 1211   Or   diphenhydrAMINE (BENADRYL) injection 50 mg  50 mg Intramuscular TID PRN [name], NP       docusate sodium (COLACE) capsule 100 mg  100 mg Oral Daily [name], NP   100 mg at 05/25/23 0831   feeding supplement (ENSURE ENLIVE / ENSURE PLUS) liquid 237 mL  237 mL Oral TID BM [name], NP   237 mL at 05/24/23 2131   haloperidol (HALDOL) tablet 5 mg  5 mg Oral TID PRN [name], NP   5 mg at 05/04/23 1837   Or   haloperidol lactate (HALDOL) injection 5 mg  5 mg Intramuscular TID PRN [name], NP       hydrOXYzine (ATARAX) tablet 25 mg  25 mg Oral TID PRN [name], DO   25 mg at 05/19/23 1218   LORazepam (ATIVAN) tablet 2 mg  2 mg Oral TID PRN [name], NP   2 mg at 01/15/23 1211   Or   LORazepam (ATIVAN) injection 2 mg  2 mg Intramuscular TID PRN [name], NP       magnesium hydroxide (MILK OF MAGNESIA) suspension 30 mL  30 mL Oral Daily PRN [name], NP   30 mL at 04/13/23 0814   melatonin tablet 5 mg  5 mg Oral QHS Nkwenti, Doris, NP   5 mg at 05/24/23 2124   nicotine polacrilex (NICORETTE) gum 2 mg  2 mg Oral PRN [name], MD   2 mg at 05/25/23 4098   paliperidone (INVEGA) 24 hr tablet 6 mg  6 mg Oral Daily [name], NP   6 mg at 05/24/23 2124   pantoprazole (PROTONIX) EC tablet 40 mg  40 mg Oral Daily [name], NP   40 mg at 05/25/23 0831   polyethylene glycol  (MIRALAX / GLYCOLAX) packet 17 g  17 g Oral Daily PRN [name], NP   17 g at 04/13/23 1734   sertraline (ZOLOFT) tablet 150 mg  150 mg Oral Daily Massengill, Harrold Donath, MD   150 mg at 05/25/23 0831   SUMAtriptan (IMITREX) tablet 25 mg  25 mg Oral BID PRN [name], NP  25 mg at 05/07/23 2112   traZODone (DESYREL) tablet 50 mg  50 mg Oral QHS [name], NP   50 mg at 05/24/23 2124   Vitamin D (Ergocalciferol) (DRISDOL) 1.25 MG (50000 UNIT) capsule 50,000 Units  50,000 Units Oral Q7 days [name], NP   50,000 Units at 05/11/23 1431    Lab Results:  No results found for this or any previous visit (from the past 48 hour(s)).   Blood Alcohol level:  Lab Results  Component Value Date   ETH <10 12/19/2022   ETH <10 11/21/2019    Metabolic Disorder Labs: Lab Results  Component Value Date   HGBA1C 4.4 (L) 12/21/2022   MPG 79.58 12/21/2022   No results found for: "PROLACTIN" Lab Results  Component Value Date   CHOL 218 (H) 12/21/2022   TRIG 81 12/21/2022   HDL 53 12/21/2022   CHOLHDL 4.1 12/21/2022   VLDL 16 12/21/2022   LDLCALC 149 (H) 12/21/2022   LDLCALC 110 (H) 03/13/2011    Physical Findings: AIMS: Facial and Oral Movements Muscles of Facial Expression: None Lips and Perioral Area: None Jaw: None Tongue: None,Extremity Movements Upper (arms, wrists, hands, fingers): None Lower (legs, knees, ankles, toes): None, Trunk Movements Neck, shoulders, hips: None, Global Judgements Severity of abnormal movements overall : None Incapacitation due to abnormal movements: None Patient's awareness of abnormal movements: No Awareness, Dental Status Current problems with teeth and/or dentures?: No Does patient usually wear dentures?: No  CIWA:    COWS:     Musculoskeletal: Strength & Muscle Tone: within normal limits Gait & Station: normal Patient leans: N/A  Psychiatric Specialty Exam:  Presentation  General Appearance:  Disheveled  Eye  Contact: Fair  Speech: Clear and Coherent  Speech Volume: Decreased  Handedness: Right   Mood and Affect  Mood: Euthymic  Affect: Constricted   Thought Process  Thought Processes: Linear  Descriptions of Associations:Intact  Orientation:Partial  Thought Content:Logical  History of Schizophrenia/Schizoaffective disorder:Yes  Duration of Psychotic Symptoms:Greater than six months  Hallucinations:Hallucinations: None  Ideas of Reference:None  Suicidal Thoughts:Suicidal Thoughts: No  Homicidal Thoughts:Homicidal Thoughts: No   Sensorium  Memory: Immediate Fair; Recent Poor  Judgment: Poor  Insight: Poor   Executive Functions  Concentration: Poor  Attention Span: Fair  Recall: Poor  Fund of Knowledge: Poor  Language: Fair   Psychomotor Activity  Psychomotor Activity: Psychomotor Activity: Normal   Assets  Assets: Leisure Time   Sleep  Sleep: Sleep: Good    Physical Exam: General: Sitting comfortably. NAD. HEENT: Normocephalic, atraumatic, MMM, EMOI Lungs: no increased work of breathing noted Heart: no cyanosis Abdomen: Non distended Musculoskeletal: FROM. No obvious deformities Skin: Warm, dry, intact. No rashes noted Neuro: No obvious focal deficits.  Gait and station are normal  Review of Systems  Constitutional: Negative.   HENT: Negative.    Eyes: Negative.   Respiratory: Negative.    Cardiovascular: Negative.   Gastrointestinal: Negative.   Genitourinary: Negative.   Skin: Negative.   Neurological: Negative.   Psychiatric/Behavioral:  Positive for depression.     Blood pressure 116/68, pulse 75, temperature 98.5 F (36.9 C), resp. rate 16, height 4\' 11"  (1.499 m), weight 63 kg, SpO2 99%. Body mass index is 28.05 kg/m.   Treatment Plan Summary: Daily contact with patient to assess and evaluate symptoms and progress in treatment and Medication management.    Still waiting on safe disposition as  patient would not be able to live independently given cognitive impairment. Recommend assisted living.  No medication side effects reported.  Because of urinary incontinence, recommended adult diapers to be worn.   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  Plan:  -Continue Imitrex 25 mg PRN BID for migraines -Continue Colace 100 mg daily for constipation -Continue Vitamin D 50.000 units weekly for bone health. -Continue Sertraline150 mg po Q daily for depression/anxiety -Continue Buspar 15 mg po bid for anxiety.  -Continue paliperidone 6 mg po qd for mood stabilization -Continue Trazodone 50 mg nightly for insomnia  -Continue Nicoderm 21 mg topically Q 24 hrs for nicotine withdrawal management. -Continue vitamin B12 1000 mcg IM weekly for 4 weeks then monthly -Continue Melatonin 5 mg nightly for sleep -Continue Protonix EC 40 mg p.o. daily for GERD -Continue MiraLAX 17 g p.o. PRN for constipation -Continue hydroxyzine 25 mg p.o. 3 times daily as needed for anxiety -Continue Ensure nutritional shakes TID in between meals  -Previously discontinued Hydroxyzine 50 mg nightly-hypotension in the mornings   Safety and Monitoring: Voluntary admission to inpatient psychiatric unit for safety, stabilization and treatment Daily contact with patient to assess and evaluate symptoms and progress in treatment Patient's case to be discussed in multi-disciplinary team meeting Observation Level : q15 minute checks Vital signs: q12 hours Precautions: Safety   Discharge Planning: Social work and case management to assist with discharge planning and identification of hospital follow-up needs prior to discharge Estimated LOS: Unknown at this time. Discharge  Concerns: Need to establish a safety plan; Medication compliance and effectiveness Discharge Goals: Return home with outpatient referrals for mental health follow-up including medication management/psychotherapy No legal guardian, has payee  Golda Acre, MD 05/25/2023, 12:00 PM

## 2023-05-25 NOTE — Progress Notes (Signed)
   05/25/23 1300  Psych Admission Type (Psych Patients Only)  Admission Status Involuntary  Psychosocial Assessment  Patient Complaints Depression  Eye Contact Fair  Facial Expression Animated  Affect Appropriate to circumstance  Speech Soft  Interaction Childlike  Motor Activity Slow  Appearance/Hygiene Disheveled  Behavior Characteristics Cooperative  Mood Sad;Pleasant  Aggressive Behavior  Effect No apparent injury  Thought Process  Coherency WDL  Content WDL  Delusions WDL  Perception WDL  Hallucination None reported or observed  Judgment Limited  Confusion WDL  Danger to Self  Current suicidal ideation? Denies  Self-Injurious Behavior No self-injurious ideation or behavior indicators observed or expressed   Agreement Not to Harm Self Yes  Description of Agreement agreed to contact staff before acting on harmful thoughts  Danger to Others  Danger to Others None reported or observed

## 2023-05-25 NOTE — Group Note (Signed)
Date:  05/25/2023 Time:  12:00 PM  Group Topic/Focus:  Goals Group:   The focus of this group is to help patients establish daily goals to achieve during treatment and discuss how the patient can incorporate goal setting into their daily lives to aide in recovery.    Participation Level:  Did Not Attend  Margaret Pyle 05/25/2023, 12:00 PM

## 2023-05-25 NOTE — Progress Notes (Signed)
   05/25/23 2100  Psych Admission Type (Psych Patients Only)  Admission Status Involuntary  Psychosocial Assessment  Patient Complaints Depression  Eye Contact Fair  Facial Expression Anxious  Affect Appropriate to circumstance  Speech Soft  Interaction Childlike  Motor Activity Slow  Appearance/Hygiene Disheveled  Behavior Characteristics Cooperative;Appropriate to situation  Mood Pleasant  Thought Process  Coherency WDL  Content WDL  Delusions None reported or observed  Perception WDL  Hallucination None reported or observed  Judgment Limited  Confusion None  Danger to Self  Current suicidal ideation? Denies

## 2023-05-25 NOTE — Group Note (Signed)
Date:  05/25/2023 Time:  12:38 PM  Group Topic/Focus:  Personal Choices and Values:   The focus of this group is to help patients assess and explore the importance of values in their lives, how their values affect their decisions, how they express their values and what opposes their expression.    Participation Level:  Did Not Attend  Yolanda [name] 05/25/2023, 12:38 PM

## 2023-05-26 DIAGNOSIS — F251 Schizoaffective disorder, depressive type: Secondary | ICD-10-CM | POA: Diagnosis not present

## 2023-05-26 NOTE — Plan of Care (Signed)
  Problem: Education: Goal: Utilization of techniques to improve thought processes will improve Outcome: Progressing Goal: Knowledge of the prescribed therapeutic regimen will improve Outcome: Progressing   Problem: Activity: Goal: Interest or engagement in leisure activities will improve Outcome: Progressing   

## 2023-05-26 NOTE — Progress Notes (Signed)
[facility] MD Progress Note  05/26/2023 11:11 AM Yolanda [name]  MRN:  [id number] Subjective: Per nursing report, patient was compliant with medication overnight.    Yolanda [name] was seen in her room during rounds.  She reported that her mood was "good" today.  She denies any new psychiatric or medical complaints.  She reports that her appetite is good.  She reports that focus and concentration are adequate.  She denies issues with energy.  She reports adequate sleep.  She denies any medication side effects.  She denies suicidal ideations, homicidal ideations, auditory hallucinations, visual hallucinations, or delusions.  Patient is not capable of living independently and social work continues to wait on group home to finalize financial details for placement.    Principal Problem: Schizoaffective disorder, depressive type (HCC) Diagnosis: Principal Problem:   Schizoaffective disorder, depressive type (HCC) Active Problems:   GERD (gastroesophageal reflux disease)   Intellectual disability   Tobacco use disorder  Total Time spent with patient: 20 minutes  Past Psychiatric History: see H&P  Past Medical History:  Past Medical History:  Diagnosis Date   Bipolar affect, depressed (HCC)    Constipation 08/17/2022   Depression    Falls 07/21/2022   Fracture of femoral neck, right, closed (HCC) 01/17/2022   Herpes simplex 08/22/2017   Open fracture dislocation of right elbow joint 01/17/2022    Past Surgical History:  Procedure Laterality Date   NO PAST SURGERIES     SALPINGECTOMY     Family History: History reviewed. No pertinent family history. Family Psychiatric  History: see H&P Social History:  Social History   Substance and Sexual Activity  Alcohol Use Yes     Social History   Substance and Sexual Activity  Drug Use Yes   Types: Cocaine, Marijuana    Social History   Socioeconomic History   Marital status: Single    Spouse name: Not on file   Number of children: Not  on file   Years of education: Not on file   Highest education level: Not on file  Occupational History   Not on file  Tobacco Use   Smoking status: Every Day   Smokeless tobacco: Not on file  Substance and Sexual Activity   Alcohol use: Yes   Drug use: Yes    Types: Cocaine, Marijuana   Sexual activity: Yes  Other Topics Concern   Not on file  Social History Narrative   Not on file   Social Determinants of Health   Financial Resource Strain: Low Risk  (09/12/2022)   Received from [facility], [facility]   Overall Financial Resource Strain (CARDIA)    Difficulty of Paying Living Expenses: Not hard at all  Food Insecurity: Patient Declined (12/20/2022)   Hunger Vital Sign    Worried About Running Out of Food in the Last Year: Patient declined    Ran Out of Food in the Last Year: Patient declined  Transportation Needs: No Transportation Needs (12/20/2022)   PRAPARE - Administrator, Civil Service (Medical): No    Lack of Transportation (Non-Medical): No  Physical Activity: Not on file  Stress: No Stress Concern Present (07/17/2022)   Received from [facility], [facility] - Occupational Stress Questionnaire    Feeling of Stress : Not at all  Social Connections: Unknown (07/16/2022)   Received from [facility], [facility]   Social Network    Social Network: Not on file   Additional Social  History:                         Sleep: Good  Appetite:  Poor  Current Medications: Current Facility-Administered Medications  Medication Dose Route Frequency Provider Last Rate Last Admin   acetaminophen (TYLENOL) tablet 650 mg  650 mg Oral Q6H PRN [name], NP   650 mg at 05/18/23 1530   alum & mag hydroxide-simeth (MAALOX/MYLANTA) 200-200-20 MG/5ML suspension 30 mL  30 mL Oral Q4H PRN [name], NP   30 mL at 01/28/23 1530   busPIRone (BUSPAR) tablet 15 mg  15 mg Oral BID [name], NP   15 mg at  05/26/23 0758   cyanocobalamin (VITAMIN B12) injection 1,000 mcg  1,000 mcg Intramuscular Q30 days Sarita Bottom, MD   1,000 mcg at 05/03/23 1257   diphenhydrAMINE (BENADRYL) capsule 50 mg  50 mg Oral TID PRN [name], NP   50 mg at 01/15/23 1211   Or   diphenhydrAMINE (BENADRYL) injection 50 mg  50 mg Intramuscular TID PRN [name], NP       docusate sodium (COLACE) capsule 100 mg  100 mg Oral Daily [name], NP   100 mg at 05/26/23 0758   feeding supplement (ENSURE ENLIVE / ENSURE PLUS) liquid 237 mL  237 mL Oral TID BM [name], NP   237 mL at 05/25/23 1556   haloperidol (HALDOL) tablet 5 mg  5 mg Oral TID PRN [name], NP   5 mg at 05/04/23 1610   Or   haloperidol lactate (HALDOL) injection 5 mg  5 mg Intramuscular TID PRN [name], NP       hydrOXYzine (ATARAX) tablet 25 mg  25 mg Oral TID PRN [name], DO   25 mg at 05/19/23 1218   LORazepam (ATIVAN) tablet 2 mg  2 mg Oral TID PRN [name], NP   2 mg at 01/15/23 1211   Or   LORazepam (ATIVAN) injection 2 mg  2 mg Intramuscular TID PRN [name], NP       magnesium hydroxide (MILK OF MAGNESIA) suspension 30 mL  30 mL Oral Daily PRN [name], NP   30 mL at 04/13/23 0814   melatonin tablet 5 mg  5 mg Oral QHS Nkwenti, Doris, NP   5 mg at 05/25/23 2050   nicotine polacrilex (NICORETTE) gum 2 mg  2 mg Oral PRN [name], MD   2 mg at 05/25/23 9604   paliperidone (INVEGA) 24 hr tablet 6 mg  6 mg Oral Daily [name], NP   6 mg at 05/25/23 2050   pantoprazole (PROTONIX) EC tablet 40 mg  40 mg Oral Daily [name], NP   40 mg at 05/26/23 0758   polyethylene glycol (MIRALAX / GLYCOLAX) packet 17 g  17 g Oral Daily PRN [name], NP   17 g at 04/13/23 1734   sertraline (ZOLOFT) tablet 150 mg  150 mg Oral Daily Massengill, Harrold Donath, MD   150 mg at 05/26/23 0757   SUMAtriptan (IMITREX) tablet 25 mg  25 mg Oral BID PRN [name], NP   25 mg at 05/07/23 2112   traZODone  (DESYREL) tablet 50 mg  50 mg Oral QHS [name], NP   50 mg at 05/25/23 2050   Vitamin D (Ergocalciferol) (DRISDOL) 1.25 MG (50000 UNIT) capsule 50,000 Units  50,000 Units Oral Q7 days [name], NP   50,000 Units at  05/25/23 1744    Lab Results:  No results found for this or any previous visit (from the past 48 hour(s)).   Blood Alcohol level:  Lab Results  Component Value Date   ETH <10 12/19/2022   ETH <10 11/21/2019    Metabolic Disorder Labs: Lab Results  Component Value Date   HGBA1C 4.4 (L) 12/21/2022   MPG 79.58 12/21/2022   No results found for: "PROLACTIN" Lab Results  Component Value Date   CHOL 218 (H) 12/21/2022   TRIG 81 12/21/2022   HDL 53 12/21/2022   CHOLHDL 4.1 12/21/2022   VLDL 16 12/21/2022   LDLCALC 149 (H) 12/21/2022   LDLCALC 110 (H) 03/13/2011    Physical Findings: AIMS: Facial and Oral Movements Muscles of Facial Expression: None Lips and Perioral Area: None Jaw: None Tongue: None,Extremity Movements Upper (arms, wrists, hands, fingers): None Lower (legs, knees, ankles, toes): None, Trunk Movements Neck, shoulders, hips: None, Global Judgements Severity of abnormal movements overall : None Incapacitation due to abnormal movements: None Patient's awareness of abnormal movements: No Awareness, Dental Status Current problems with teeth and/or dentures?: No Does patient usually wear dentures?: No  CIWA:    COWS:     Musculoskeletal: Strength & Muscle Tone: within normal limits Gait & Station: normal Patient leans: N/A  Psychiatric Specialty Exam:  Presentation  General Appearance:  Disheveled  Eye Contact: Fair  Speech: Clear and Coherent  Speech Volume: Decreased  Handedness: Right   Mood and Affect  Mood: Euthymic  Affect: Constricted   Thought Process  Thought Processes: Linear  Descriptions of Associations:Intact  Orientation:Partial  Thought Content:Logical  History of  Schizophrenia/Schizoaffective disorder:Yes  Duration of Psychotic Symptoms:Greater than six months  Hallucinations:No data recorded  Ideas of Reference:None  Suicidal Thoughts:No data recorded  Homicidal Thoughts:No data recorded   Sensorium  Memory: Immediate Fair; Recent Poor  Judgment: Poor  Insight: Poor   Executive Functions  Concentration: Poor  Attention Span: Fair  Recall: Poor  Fund of Knowledge: Poor  Language: Fair   Psychomotor Activity  Psychomotor Activity: No data recorded   Assets  Assets: Leisure Time   Sleep  Sleep: No data recorded    Physical Exam: General: Sitting comfortably. NAD. HEENT: Normocephalic, atraumatic, MMM, EMOI Lungs: no increased work of breathing noted Heart: no cyanosis Abdomen: Non distended Musculoskeletal: FROM. No obvious deformities Skin: Warm, dry, intact. No rashes noted Neuro: No obvious focal deficits.  Gait and station are normal  Review of Systems  Constitutional: Negative.   HENT: Negative.    Eyes: Negative.   Respiratory: Negative.    Cardiovascular: Negative.   Gastrointestinal: Negative.   Genitourinary: Negative.   Skin: Negative.   Neurological: Negative.   Psychiatric/Behavioral:  Positive for depression.     Blood pressure 97/70, pulse 74, temperature 98.5 F (36.9 C), resp. rate 16, height 4\' 11"  (1.499 m), weight 63 kg, SpO2 99%. Body mass index is 28.05 kg/m.   Treatment Plan Summary: Daily contact with patient to assess and evaluate symptoms and progress in treatment and Medication management.    Still waiting on safe disposition as patient would not be able to live independently given cognitive impairment. Recommend assisted living.    No medication side effects reported.  Because of urinary incontinence, recommended adult diapers to be worn.   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  Plan:  -Continue Imitrex 25 mg PRN BID for migraines -Continue Colace 100 mg daily for constipation -Continue Vitamin D 50.000 units weekly for bone health. -Continue Sertraline150 mg po Q daily for depression/anxiety -Continue Buspar 15 mg po bid for anxiety.  -Continue paliperidone 6 mg po qd for mood stabilization -Continue Trazodone 50 mg nightly for insomnia  -Continue Nicoderm 21 mg topically Q 24 hrs for nicotine withdrawal management. -Continue vitamin B12 1000 mcg IM weekly for 4 weeks then monthly -Continue Melatonin 5 mg nightly for sleep -Continue Protonix EC 40 mg p.o. daily for GERD -Continue MiraLAX 17 g p.o. PRN for constipation -Continue hydroxyzine 25 mg p.o. 3 times daily as needed for anxiety -Continue Ensure nutritional shakes TID in between meals  -Previously discontinued Hydroxyzine 50 mg nightly-hypotension in the mornings   Safety and Monitoring: Voluntary admission to inpatient psychiatric unit for safety, stabilization and treatment Daily contact with patient to assess and evaluate symptoms and progress in treatment Patient's case to be discussed in multi-disciplinary team meeting Observation Level : q15 minute checks Vital signs: q12 hours Precautions: Safety   Discharge Planning: Social work and case management to assist with discharge planning and identification of hospital follow-up needs prior to discharge Estimated LOS: Unknown at this time. Discharge Concerns: Need to establish a safety plan; Medication compliance and effectiveness Discharge Goals: Return home with outpatient referrals for mental health follow-up including medication management/psychotherapy No legal guardian, has payee  Golda Acre, MD 05/26/2023, 11:11 AM

## 2023-05-26 NOTE — [facility] Group Notes (Signed)
The topic for discussion in today's wrap-up group is, "What are you willing to hold yourself accountable for?" In order to preserve a favorable outcome, avoid exhaustion or overthinking, and stay positive and optimistic in their own rehabilitation, the patient attended  group and shared their deep concepts and therapeutic techniques they had learn throughout program.

## 2023-05-26 NOTE — [facility] Group Notes (Signed)
[facility] Group Notes:  (Nursing/MHT/Case Management/Adjunct)  Date:  05/26/2023  Time: 1330  Type of Therapy:  Psychoeducational Skills  Participation Level:  Active  Participation Quality:  Appropriate  Affect:  Appropriate  Cognitive:  Appropriate  Insight:  Appropriate  Engagement in Group:  Engaged  Modes of Intervention:  Discussion, Education, and Exploration  Summary of Progress/Problems: Psychoeducational group in which patients were asked to identify patterns of negative thinking and how this can impact mental health. Pt attended and was appropriate.   Malva Limes 05/26/2023, 5:36 PM

## 2023-05-26 NOTE — [facility] Group Notes (Signed)
Type of Therapy and Topic:  Group Therapy: Gratitude  Participation Level:  Did Not Attend   Description of Group:   In this group, the patient participates in deep breathing exercise to help quiet down the room and memory game to get to know one another. Patients shared and discussed the importance of acknowledging the elements in their lives for which they are grateful and how this can positively impact their mood.  The group discussed how bringing the positive elements of their lives to the forefront of their minds can help bring positive impact to the physical and mental health.  An exercise was done as a group in which a list was made of gratitude items in order to encourage participants to consider other potential positives in their lives.  Therapeutic Goals: Patients will identify one or more item for which they are grateful in each of 6 categories:  people, experiences, things, places, skills, and other. Patients will discuss how it is possible to seek out gratitude in even bad situations. Patients will explore other possible items of gratitude that they could remember.   Summary of Patient Progress:  NA  Therapeutic Modalities:   Solution-Focused Therapy Activity

## 2023-05-26 NOTE — Progress Notes (Signed)
   05/26/23 1700  Psych Admission Type (Psych Patients Only)  Admission Status Involuntary  Psychosocial Assessment  Patient Complaints Depression  Eye Contact Fair  Facial Expression Anxious  Affect Appropriate to circumstance  Speech Soft  Interaction Childlike  Motor Activity Slow  Appearance/Hygiene Disheveled  Behavior Characteristics Cooperative;Appropriate to situation  Mood Pleasant  Aggressive Behavior  Effect No apparent injury  Thought Process  Coherency WDL  Content WDL  Delusions None reported or observed  Perception WDL  Hallucination None reported or observed  Judgment Limited  Confusion None  Danger to Self  Current suicidal ideation? Denies  Danger to Others  Danger to Others None reported or observed

## 2023-05-27 DIAGNOSIS — F251 Schizoaffective disorder, depressive type: Secondary | ICD-10-CM | POA: Diagnosis not present

## 2023-05-27 NOTE — Group Note (Signed)
Date:  05/27/2023 Time:  11:05 AM  Group Topic/Focus:  Wellness Toolbox:   The focus of this group is to discuss various aspects of wellness, balancing those aspects and exploring ways to increase the ability to experience wellness.  Patients will create a wellness toolbox for use upon discharge.    Participation Level:  Did Not Attend  Participation Quality:    Affect:      Cognitive:      Insight: None  Engagement in Group:    Modes of Intervention:    Additional Comments:    Beckie Busing 05/27/2023, 11:05 AM

## 2023-05-27 NOTE — Group Note (Signed)
Date:  05/27/2023 Time:  9:21 AM  Group Topic/Focus:  Goals Group:   The focus of this group is to help patients establish daily goals to achieve during treatment and discuss how the patient can incorporate goal setting into their daily lives to aide in recovery.    Participation Level:  Did Not Attend  Participation Quality:      Affect:      Cognitive:      Insight: None  Engagement in Group:      Modes of Intervention:      Additional Comments:    Beckie Busing 05/27/2023, 9:21 AM

## 2023-05-27 NOTE — Group Note (Signed)
Recreation Therapy Group Note   Group Topic:Stress Management  Group Date: 05/27/2023 Start Time: 0940 End Time: 0955 Facilitators: Jolan Mealor-McCall, LRT,CTRS Location: [address] Dayroom   Group Topic: Stress Management   Goal Area(s) Addresses:  Patient will actively participate in stress management techniques presented during session.  Patient will successfully identify benefit of practicing stress management post d/c.   Behavioral Response: Appropriate  Intervention: Relaxation exercise with ambient sound and script   Group Description: Guided Imagery. LRT provided education, instruction, and demonstration on practice of visualization via guided imagery. Patient was asked to participate in the technique introduced during session. LRT debriefed including topics of mindfulness, stress management and specific scenarios each patient could use these techniques. Patients were given suggestions of ways to access scripts post d/c and encouraged to explore Youtube and other apps available on smartphones, tablets, and computers.  Education: Stress Management, Discharge Planning.   Education Outcome: Acknowledges education   Affect/Mood: N/A   Participation Level: Did not attend    Clinical Observations/Individualized Feedback:     Plan: Continue to engage patient in RT group sessions 2-3x/week.   Lynette Noah-McCall, LRT,CTRS 05/27/2023 12:36 PM

## 2023-05-27 NOTE — Progress Notes (Signed)
   05/27/23 2015  Psych Admission Type (Psych Patients Only)  Admission Status Involuntary  Psychosocial Assessment  Patient Complaints Anxiety;Sadness  Eye Contact Fair  Facial Expression Flat;Sad  Affect Depressed  Speech Soft;Slow  Interaction Childlike;Cautious  Motor Activity Slow  Appearance/Hygiene Disheveled  Behavior Characteristics Cooperative;Anxious  Mood Depressed;Sad  Aggressive Behavior  Effect No apparent injury  Thought Process  Coherency WDL  Content WDL  Delusions WDL  Perception WDL  Hallucination None reported or observed  Judgment Impaired  Confusion None  Danger to Self  Current suicidal ideation? Denies  Danger to Others  Danger to Others None reported or observed

## 2023-05-27 NOTE — BHH Group Notes (Signed)

## 2023-05-27 NOTE — Progress Notes (Signed)
[facility] MD Progress Note  05/27/2023 9:37 AM Yolanda [name]  MRN:  [id number] Subjective: Per nursing report, patient was compliant with medication overnight.    Yolanda [name] was seen in her room during rounds.  She reported that her mood was "fair" today.  She denies any new psychiatric or medical complaints.  She reports that her appetite is good.  She reports that focus and concentration are adequate.  She denies issues with energy.  She reports adequate sleep.  She denies any medication side effects.  She denies suicidal ideations, homicidal ideations, auditory hallucinations, visual hallucinations, or delusions.  However she remains withdrawn.  She states that she did take her Yolanda [name] GED testing.  She is hoping for placement soon.  Patient is not capable of living independently and social work continues to wait on group home to finalize financial details for placement.    Principal Problem: Schizoaffective disorder, depressive type (HCC) Diagnosis: Principal Problem:   Schizoaffective disorder, depressive type (HCC) Active Problems:   GERD (gastroesophageal reflux disease)   Intellectual disability   Tobacco use disorder  Total Time spent with patient: 20 minutes  Past Psychiatric History: see H&P  Past Medical History:  Past Medical History:  Diagnosis Date   Bipolar affect, depressed (HCC)    Constipation 08/17/2022   Depression    Falls 07/21/2022   Fracture of femoral neck, right, closed (HCC) 01/17/2022   Herpes simplex 08/22/2017   Open fracture dislocation of right elbow joint 01/17/2022    Past Surgical History:  Procedure Laterality Date   NO PAST SURGERIES     SALPINGECTOMY     Family History: History reviewed. No pertinent family history. Family Psychiatric  History: see H&P Social History:  Social History   Substance and Sexual Activity  Alcohol Use Yes     Social History   Substance and Sexual Activity  Drug Use Yes   Types: Cocaine, Marijuana     Social History   Socioeconomic History   Marital status: Single    Spouse name: Not on file   Number of children: Not on file   Years of education: Not on file   Highest education level: Not on file  Occupational History   Not on file  Tobacco Use   Smoking status: Every Day   Smokeless tobacco: Not on file  Substance and Sexual Activity   Alcohol use: Yes   Drug use: Yes    Types: Cocaine, Marijuana   Sexual activity: Yes  Other Topics Concern   Not on file  Social History Narrative   Not on file   Social Determinants of Health   Financial Resource Strain: Low Risk  (09/12/2022)   Received from [facility], [facility]   Overall Financial Resource Strain (CARDIA)    Difficulty of Paying Living Expenses: Not hard at all  Food Insecurity: Patient Declined (12/20/2022)   Hunger Vital Sign    Worried About Running Out of Food in the Last Year: Patient declined    Ran Out of Food in the Last Year: Patient declined  Transportation Needs: No Transportation Needs (12/20/2022)   PRAPARE - Administrator, Civil Service (Medical): No    Lack of Transportation (Non-Medical): No  Physical Activity: Not on file  Stress: No Stress Concern Present (07/17/2022)   Received from [facility] of Occupational Health - Occupational Stress Questionnaire    Feeling of Stress : Not at all  Social Connections: Unknown (07/16/2022)  Received from [employer], [facility]   Social Network    Social Network: Not on file   Additional Social History:                         Sleep: Good  Appetite:  Poor  Current Medications: Current Facility-Administered Medications  Medication Dose Route Frequency Provider Last Rate Last Admin   acetaminophen (TYLENOL) tablet 650 mg  650 mg Oral Q6H PRN [name], NP   650 mg at 05/26/23 1447   alum & mag hydroxide-simeth (MAALOX/MYLANTA) 200-200-20 MG/5ML suspension 30 mL  30 mL Oral Q4H PRN  [name], NP   30 mL at 01/28/23 1530   busPIRone (BUSPAR) tablet 15 mg  15 mg Oral BID [name], NP   15 mg at 05/27/23 0745   cyanocobalamin (VITAMIN B12) injection 1,000 mcg  1,000 mcg Intramuscular Q30 days Sarita Bottom, MD   1,000 mcg at 05/03/23 1257   diphenhydrAMINE (BENADRYL) capsule 50 mg  50 mg Oral TID PRN [name], NP   50 mg at 01/15/23 1211   Or   diphenhydrAMINE (BENADRYL) injection 50 mg  50 mg Intramuscular TID PRN [name], NP       docusate sodium (COLACE) capsule 100 mg  100 mg Oral Daily [name], NP   100 mg at 05/27/23 0745   feeding supplement (ENSURE ENLIVE / ENSURE PLUS) liquid 237 mL  237 mL Oral TID BM [name], NP   237 mL at 05/26/23 2059   haloperidol (HALDOL) tablet 5 mg  5 mg Oral TID PRN [name], NP   5 mg at 05/04/23 4696   Or   haloperidol lactate (HALDOL) injection 5 mg  5 mg Intramuscular TID PRN [name], NP       hydrOXYzine (ATARAX) tablet 25 mg  25 mg Oral TID PRN [name], DO   25 mg at 05/19/23 1218   LORazepam (ATIVAN) tablet 2 mg  2 mg Oral TID PRN [name], NP   2 mg at 01/15/23 1211   Or   LORazepam (ATIVAN) injection 2 mg  2 mg Intramuscular TID PRN [name], NP       magnesium hydroxide (MILK OF MAGNESIA) suspension 30 mL  30 mL Oral Daily PRN [name], NP   30 mL at 04/13/23 0814   melatonin tablet 5 mg  5 mg Oral QHS Nkwenti, Doris, NP   5 mg at 05/26/23 2036   nicotine polacrilex (NICORETTE) gum 2 mg  2 mg Oral PRN [name], MD   2 mg at 05/26/23 1806   paliperidone (INVEGA) 24 hr tablet 6 mg  6 mg Oral Daily [name], NP   6 mg at 05/26/23 2036   pantoprazole (PROTONIX) EC tablet 40 mg  40 mg Oral Daily [name], NP   40 mg at 05/27/23 0745   polyethylene glycol (MIRALAX / GLYCOLAX) packet 17 g  17 g Oral Daily PRN [name], NP   17 g at 04/13/23 1734   sertraline (ZOLOFT) tablet 150 mg  150 mg Oral Daily Massengill, Harrold Donath, MD   150 mg at 05/27/23  0745   SUMAtriptan (IMITREX) tablet 25 mg  25 mg Oral BID PRN [name], NP   25 mg at 05/07/23 2112   traZODone (DESYREL) tablet 50 mg  50 mg Oral QHS [name], NP   50 mg at 05/26/23 2036   Vitamin D (Ergocalciferol) (  DRISDOL) 1.25 MG (50000 UNIT) capsule 50,000 Units  50,000 Units Oral Q7 days [name], NP   50,000 Units at 05/25/23 1744    Lab Results:  No results found for this or any previous visit (from the past 48 hour(s)).   Blood Alcohol level:  Lab Results  Component Value Date   ETH <10 12/19/2022   ETH <10 11/21/2019    Metabolic Disorder Labs: Lab Results  Component Value Date   HGBA1C 4.4 (L) 12/21/2022   MPG 79.58 12/21/2022   No results found for: "PROLACTIN" Lab Results  Component Value Date   CHOL 218 (H) 12/21/2022   TRIG 81 12/21/2022   HDL 53 12/21/2022   CHOLHDL 4.1 12/21/2022   VLDL 16 12/21/2022   LDLCALC 149 (H) 12/21/2022   LDLCALC 110 (H) 03/13/2011    Physical Findings: AIMS: Facial and Oral Movements Muscles of Facial Expression: None Lips and Perioral Area: None Jaw: None Tongue: None,Extremity Movements Upper (arms, wrists, hands, fingers): None Lower (legs, knees, ankles, toes): None, Trunk Movements Neck, shoulders, hips: None, Global Judgements Severity of abnormal movements overall : None Incapacitation due to abnormal movements: None Patient's awareness of abnormal movements: No Awareness, Dental Status Current problems with teeth and/or dentures?: No Does patient usually wear dentures?: No  CIWA:    COWS:     Musculoskeletal: Strength & Muscle Tone: within normal limits Gait & Station: normal Patient leans: N/A  Psychiatric Specialty Exam:  Presentation  General Appearance:  Casual; Disheveled  Eye Contact: Fair  Speech: Clear and Coherent; Slow  Speech Volume: Decreased  Handedness: Right   Mood and Affect  Mood: Depressed  Affect: Blunt   Thought Process  Thought  Processes: Linear  Descriptions of Associations:Intact  Orientation:Full (Time, Place and Person)  Thought Content:Logical  History of Schizophrenia/Schizoaffective disorder:Yes  Duration of Psychotic Symptoms:Greater than six months  Hallucinations:Hallucinations: None   Ideas of Reference:None  Suicidal Thoughts:Suicidal Thoughts: No   Homicidal Thoughts:Homicidal Thoughts: No    Sensorium  Memory: Immediate Fair; Remote Fair; Recent Fair  Judgment: Fair  Insight: Fair   Art therapist  Concentration: Fair  Attention Span: Fair  Recall: [employer] of Knowledge: Fair  Language: Fair   Psychomotor Activity  Psychomotor Activity: Psychomotor Activity: Normal    Assets  Assets: Desire for Improvement; Communication Skills   Sleep  Sleep: Number of Hours of Sleep: 7     Physical Exam: General: Sitting comfortably. NAD. HEENT: Normocephalic, atraumatic, MMM, EMOI Lungs: no increased work of breathing noted Heart: no cyanosis Abdomen: Non distended Musculoskeletal: FROM. No obvious deformities Skin: Warm, dry, intact. No rashes noted Neuro: No obvious focal deficits.  Gait and station are normal  Review of Systems  Constitutional: Negative.   HENT: Negative.    Eyes: Negative.   Respiratory: Negative.    Cardiovascular: Negative.   Gastrointestinal: Negative.   Genitourinary: Negative.   Skin: Negative.   Neurological: Negative.   Psychiatric/Behavioral:  Positive for depression.     Blood pressure (!) 104/93, pulse 90, temperature 97.6 F (36.4 C), temperature source Oral, resp. rate 20, height 4\' 11"  (1.499 m), weight 63 kg, SpO2 99%. Body mass index is 28.05 kg/m.   Treatment Plan Summary: Daily contact with patient to assess and evaluate symptoms and progress in treatment and Medication management.    Still waiting on safe disposition as patient would not be able to live independently given cognitive impairment.  Recommend assisted living.  The patient's disposition remains unresolved.  Hopefully  progress will be made soon.  No medication side effects reported.  Because of urinary incontinence, recommended adult diapers to be worn.   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  Plan:  -Continue Imitrex 25 mg PRN BID for migraines -Continue Colace 100 mg daily for constipation -Continue Vitamin D 50.000 units weekly for bone health. -Continue Sertraline150 mg po Q daily for depression/anxiety -Continue Buspar 15 mg po bid for anxiety.  -Continue paliperidone 6 mg po qd for mood stabilization -Continue Trazodone 50 mg nightly for insomnia  -Continue Nicoderm 21 mg topically Q 24 hrs for nicotine withdrawal management. -Continue vitamin B12 1000 mcg IM weekly for 4 weeks then monthly -Continue Melatonin 5 mg nightly for sleep -Continue Protonix EC 40 mg p.o. daily for GERD -Continue MiraLAX 17 g p.o. PRN for constipation -Continue hydroxyzine 25 mg p.o. 3 times daily as needed for anxiety -Continue Ensure nutritional shakes TID in between meals  -Previously discontinued Hydroxyzine 50 mg nightly-hypotension in the mornings   Safety and Monitoring: Voluntary admission to inpatient psychiatric unit for safety, stabilization and treatment Daily contact with patient to assess and evaluate symptoms and progress in treatment Patient's case to be discussed in multi-disciplinary team meeting Observation Level : q15 minute checks Vital signs: q12 hours Precautions: Safety   Discharge Planning: Social work and case management to assist with discharge planning and identification of hospital follow-up needs prior to discharge Estimated LOS: Unknown at this  time. Discharge Concerns: Need to establish a safety plan; Medication compliance and effectiveness Discharge Goals: Return home with outpatient referrals for mental health follow-up including medication management/psychotherapy No legal guardian, has payee  [name], MD 05/27/2023, 9:37 AM Patient ID: Yolanda [name], female   DOB: 1975/11/29, 47 y.o.   MRN: [id number]

## 2023-05-27 NOTE — Plan of Care (Signed)
  Problem: Activity: Goal: Imbalance in normal sleep/wake cycle will improve Outcome: Progressing   Problem: Coping: Goal: Will verbalize feelings Outcome: Progressing   Problem: Health Behavior/Discharge Planning: Goal: Compliance with therapeutic regimen will improve Outcome: Progressing   Problem: Activity: Goal: Interest or engagement in leisure activities will improve Outcome: Not Progressing   Problem: Coping: Goal: Coping ability will improve Outcome: Not Progressing

## 2023-05-27 NOTE — Progress Notes (Signed)
Patient ID: Yolanda [name], female   DOB: 28-Aug-1975, 47 y.o.   MRN: [id number]   Delia Heady ([phone]) was contacted to discuss current status and placement with [facility].  Delia Heady stated that Patient is now with [facility] and has not been assigned a care manager to assist with the Medicare part C.  [facility] has accepted Patient but has requested Medicaid part C in order to admit.   Requested DSS to pay for placement until insurance funding is available.  Guardian reports that her supervisor, Bridgett Larsson, will have to determine DSS funding.  Nolon Rod, LCSW

## 2023-05-27 NOTE — Plan of Care (Signed)
  Problem: Activity: Goal: Interest or engagement in leisure activities will improve Outcome: Progressing   Problem: Health Behavior/Discharge Planning: Goal: Compliance with therapeutic regimen will improve Outcome: Progressing   

## 2023-05-27 NOTE — Progress Notes (Signed)
   05/27/23 0900  Psych Admission Type (Psych Patients Only)  Admission Status Involuntary  Psychosocial Assessment  Patient Complaints Anxiety;Depression  Eye Contact Fair  Facial Expression Flat;Sad  Affect Anxious;Depressed  Speech Soft  Interaction Childlike  Motor Activity Slow  Appearance/Hygiene Disheveled;Poor hygiene  Behavior Characteristics Cooperative;Anxious  Mood Depressed;Anxious  Thought Process  Coherency WDL  Content WDL  Delusions None reported or observed  Perception WDL  Hallucination None reported or observed  Judgment Impaired  Confusion None  Danger to Self  Current suicidal ideation? Denies  Description of Suicide Plan No plan  Self-Injurious Behavior No self-injurious ideation or behavior indicators observed or expressed   Agreement Not to Harm Self Yes  Description of Agreement Verbal  Danger to Others  Danger to Others None reported or observed

## 2023-05-27 NOTE — Plan of Care (Signed)
  Problem: Activity: Goal: Interest or engagement in leisure activities will improve Outcome: Progressing   

## 2023-05-28 DIAGNOSIS — F251 Schizoaffective disorder, depressive type: Secondary | ICD-10-CM | POA: Diagnosis not present

## 2023-05-28 NOTE — Progress Notes (Signed)
   05/28/23 2130  Psych Admission Type (Psych Patients Only)  Admission Status Involuntary  Psychosocial Assessment  Patient Complaints Sadness  Eye Contact Fair  Facial Expression Flat;Sad  Affect Depressed  Speech Soft;Slow  Interaction Childlike;Cautious  Motor Activity Slow  Appearance/Hygiene Disheveled  Behavior Characteristics Cooperative  Mood Sad  Aggressive Behavior  Effect No apparent injury  Thought Process  Coherency WDL  Content WDL  Delusions WDL  Perception WDL  Hallucination None reported or observed  Judgment Impaired  Confusion None  Danger to Self  Current suicidal ideation? Denies  Danger to Others  Danger to Others None reported or observed

## 2023-05-28 NOTE — Plan of Care (Signed)
  Problem: Activity: Goal: Interest or engagement in leisure activities will improve Outcome: Progressing   Problem: Coping: Goal: Coping ability will improve Outcome: Progressing Goal: Will verbalize feelings Outcome: Progressing   

## 2023-05-28 NOTE — Progress Notes (Signed)
Patient ID: Yolanda [name], female   DOB: 12/25/1975, 47 y.o.   MRN: [id number] Pt remains boarding on unit in anticipation of discharge to group home facility. SW reports ongoing planning with DSS for payment to new facility.  Yolanda [name] has been in bed most of am shift and upon approach denies any acute concerns but is noted to be tearful at times, and continues to voice frustrations at length of stay . She has not attended group this am and continues to require prompting to keep her room clean, hoarding snacks and cups with many dirty cups noted in room. Encouraged pt to clean room and attend to her hygiene. She has declined to complete self inventory today. Pt denies any SI HI . No signs pt is responding to internal stimuli. Pt is safe, will con't to monitor

## 2023-05-28 NOTE — Group Note (Signed)
[facility] LCSW Group Therapy Note   Group Date: 05/28/2023 Start Time: 1100 End Time: 1200   Type of Therapy and Topic: Group Therapy: Avoiding Self-Sabotaging and Enabling Behaviors  Participation Level: Did Not Attend  Mood:  Description of Group:  In this group, patients will learn how to identify obstacles, self-sabotaging and enabling behaviors, as well as: what are they, why do we do them and what needs these behaviors meet. Discuss unhealthy relationships and how to have positive healthy boundaries with those that sabotage and enable. Explore aspects of self-sabotage and enabling in yourself and how to limit these self-destructive behaviors in everyday life.   Therapeutic Goals: 1. Patient will identify one obstacle that relates to self-sabotage and enabling behaviors 2. Patient will identify one personal self-sabotaging or enabling behavior they did prior to admission 3. Patient will state a plan to change the above identified behavior 4. Patient will demonstrate ability to communicate their needs through discussion and/or role play.    Summary of Patient Progress:   Did not attend   Therapeutic Modalities:  Cognitive Behavioral Therapy Person-Centered Therapy Motivational Interviewing    Marinda Elk, [place]

## 2023-05-28 NOTE — Plan of Care (Signed)

## 2023-05-28 NOTE — BHH Group Notes (Signed)
Spiritual care group on grief and loss facilitated by Chaplain Dyanne Carrel, Bcc and Arlyce Dice, Mdiv  Group Goal: Support / Education around grief and loss  Members engage in facilitated group support and psycho-social education.  Group Description:  Following introductions and group rules, group members engaged in facilitated group dialogue and support around topic of loss, with particular support around experiences of loss in their lives. Group Identified types of loss (relationships / self / things) and identified patterns, circumstances, and changes that precipitate losses. Reflected on thoughts / feelings around loss, normalized grief responses, and recognized variety in grief experience. Group encouraged individual reflection on safe space and on the coping skills that they are already utilizing.  Group drew on Adlerian / Rogerian and narrative framework  Patient Progress: Did not attend.

## 2023-05-28 NOTE — [facility] Group Notes (Signed)
[facility] Group Notes:  (Nursing/MHT/Case Management/Adjunct)  Date:  05/28/2023  Time:  8:55 PM  Type of Therapy:  Psychoeducational Skills  Participation Level:  Active  Participation Quality:  Appropriate  Affect:  Excited  Cognitive:  Appropriate  Insight:  Improving  Engagement in Group:  Developing/Improving  Modes of Intervention:  Education  Summary of Progress/Problems: The patient rated her day as a 5 out of 10. She states that she had a good talk with her counselor from the outside and that she learned about "science" today. Her goal for tomorrow is to attend more groups.   Hazle Coca S 05/28/2023, 8:55 PM

## 2023-05-28 NOTE — Progress Notes (Signed)
[facility] MD Progress Note  05/28/2023 10:20 AM Yolanda [name]  MRN:  [id number] Subjective: Per nursing report, patient was compliant with medication overnight.    Yolanda [name] was seen in her room during rounds.  She is a little bit more withdrawn today.  She denies any new psychiatric or medical complaints.  She has been eating fairly well she reports that she is able to focus and concentrate.  She seems somewhat isolative and frustrated about continuing to stay in patient with no place to go.  She is hoping for placement soon. Her sleep is adequate.  She denies any side effects on medications.  She is contracting for safety.  Patient is not capable of living independently and social work continues to wait on group home to finalize financial details for placement.    Principal Problem: Schizoaffective disorder, depressive type (HCC) Diagnosis: Principal Problem:   Schizoaffective disorder, depressive type (HCC) Active Problems:   GERD (gastroesophageal reflux disease)   Intellectual disability   Tobacco use disorder  Total Time spent with patient: 20 minutes  Past Psychiatric History: see H&P  Past Medical History:  Past Medical History:  Diagnosis Date   Bipolar affect, depressed (HCC)    Constipation 08/17/2022   Depression    Falls 07/21/2022   Fracture of femoral neck, right, closed (HCC) 01/17/2022   Herpes simplex 08/22/2017   Open fracture dislocation of right elbow joint 01/17/2022    Past Surgical History:  Procedure Laterality Date   NO PAST SURGERIES     SALPINGECTOMY     Family History: History reviewed. No pertinent family history. Family Psychiatric  History: see H&P Social History:  Social History   Substance and Sexual Activity  Alcohol Use Yes     Social History   Substance and Sexual Activity  Drug Use Yes   Types: Cocaine, Marijuana    Social History   Socioeconomic History   Marital status: Single    Spouse name: Not on file   Number of  children: Not on file   Years of education: Not on file   Highest education level: Not on file  Occupational History   Not on file  Tobacco Use   Smoking status: Every Day   Smokeless tobacco: Not on file  Substance and Sexual Activity   Alcohol use: Yes   Drug use: Yes    Types: Cocaine, Marijuana   Sexual activity: Yes  Other Topics Concern   Not on file  Social History Narrative   Not on file   Social Determinants of Health   Financial Resource Strain: Low Risk  (09/12/2022)   Received from [facility], [facility]   Overall Financial Resource Strain (CARDIA)    Difficulty of Paying Living Expenses: Not hard at all  Food Insecurity: Patient Declined (12/20/2022)   Hunger Vital Sign    Worried About Running Out of Food in the Last Year: Patient declined    Ran Out of Food in the Last Year: Patient declined  Transportation Needs: No Transportation Needs (12/20/2022)   PRAPARE - Administrator, Civil Service (Medical): No    Lack of Transportation (Non-Medical): No  Physical Activity: Not on file  Stress: No Stress Concern Present (07/17/2022)   Received from [facility], [facility] of Occupational Health - Occupational Stress Questionnaire    Feeling of Stress : Not at all  Social Connections: Unknown (07/16/2022)   Received from [facility], [facility]   Social Network  Social Network: Not on file   Additional Social History:                         Sleep: Good  Appetite:  Poor  Current Medications: Current Facility-Administered Medications  Medication Dose Route Frequency Provider Last Rate Last Admin   acetaminophen (TYLENOL) tablet 650 mg  650 mg Oral Q6H PRN [name], NP   650 mg at 05/26/23 1447   alum & mag hydroxide-simeth (MAALOX/MYLANTA) 200-200-20 MG/5ML suspension 30 mL  30 mL Oral Q4H PRN [name], NP   30 mL at 01/28/23 1530   busPIRone (BUSPAR) tablet 15 mg  15 mg Oral BID [name],  NP   15 mg at 05/28/23 0739   cyanocobalamin (VITAMIN B12) injection 1,000 mcg  1,000 mcg Intramuscular Q30 days Sarita Bottom, MD   1,000 mcg at 05/03/23 1257   diphenhydrAMINE (BENADRYL) capsule 50 mg  50 mg Oral TID PRN [name], NP   50 mg at 01/15/23 1211   Or   diphenhydrAMINE (BENADRYL) injection 50 mg  50 mg Intramuscular TID PRN [name], NP       docusate sodium (COLACE) capsule 100 mg  100 mg Oral Daily [name], NP   100 mg at 05/28/23 0739   feeding supplement (ENSURE ENLIVE / ENSURE PLUS) liquid 237 mL  237 mL Oral TID BM [name], NP   237 mL at 05/27/23 2122   haloperidol (HALDOL) tablet 5 mg  5 mg Oral TID PRN [name], NP   5 mg at 05/04/23 1610   Or   haloperidol lactate (HALDOL) injection 5 mg  5 mg Intramuscular TID PRN [name], NP       hydrOXYzine (ATARAX) tablet 25 mg  25 mg Oral TID PRN [name], DO   25 mg at 05/27/23 1146   LORazepam (ATIVAN) tablet 2 mg  2 mg Oral TID PRN [name], NP   2 mg at 01/15/23 1211   Or   LORazepam (ATIVAN) injection 2 mg  2 mg Intramuscular TID PRN [name], NP       magnesium hydroxide (MILK OF MAGNESIA) suspension 30 mL  30 mL Oral Daily PRN [name], NP   30 mL at 04/13/23 0814   melatonin tablet 5 mg  5 mg Oral QHS Nkwenti, Doris, NP   5 mg at 05/27/23 2121   nicotine polacrilex (NICORETTE) gum 2 mg  2 mg Oral PRN [name], MD   2 mg at 05/27/23 1146   paliperidone (INVEGA) 24 hr tablet 6 mg  6 mg Oral Daily Nkwenti, Tyler Aas, NP   6 mg at 05/27/23 2121   pantoprazole (PROTONIX) EC tablet 40 mg  40 mg Oral Daily [name], NP   40 mg at 05/28/23 0739   polyethylene glycol (MIRALAX / GLYCOLAX) packet 17 g  17 g Oral Daily PRN [name], NP   17 g at 04/13/23 1734   sertraline (ZOLOFT) tablet 150 mg  150 mg Oral Daily Massengill, Harrold Donath, MD   150 mg at 05/28/23 0739   SUMAtriptan (IMITREX) tablet 25 mg  25 mg Oral BID PRN [name], NP   25 mg at 05/07/23 2112    traZODone (DESYREL) tablet 50 mg  50 mg Oral QHS [name], NP   50 mg at 05/27/23 2121   Vitamin D (Ergocalciferol) (DRISDOL) 1.25 MG (50000 UNIT) capsule 50,000 Units  50,000 Units Oral Q7  days [name], NP   50,000 Units at 05/25/23 1744    Lab Results:  No results found for this or any previous visit (from the past 48 hour(s)).   Blood Alcohol level:  Lab Results  Component Value Date   ETH <10 12/19/2022   ETH <10 11/21/2019    Metabolic Disorder Labs: Lab Results  Component Value Date   HGBA1C 4.4 (L) 12/21/2022   MPG 79.58 12/21/2022   No results found for: "PROLACTIN" Lab Results  Component Value Date   CHOL 218 (H) 12/21/2022   TRIG 81 12/21/2022   HDL 53 12/21/2022   CHOLHDL 4.1 12/21/2022   VLDL 16 12/21/2022   LDLCALC 149 (H) 12/21/2022   LDLCALC 110 (H) 03/13/2011    Physical Findings: AIMS: Facial and Oral Movements Muscles of Facial Expression: None Lips and Perioral Area: None Jaw: None Tongue: None,Extremity Movements Upper (arms, wrists, hands, fingers): None Lower (legs, knees, ankles, toes): None, Trunk Movements Neck, shoulders, hips: None, Global Judgements Severity of abnormal movements overall : None Incapacitation due to abnormal movements: None Patient's awareness of abnormal movements: No Awareness, Dental Status Current problems with teeth and/or dentures?: No Does patient usually wear dentures?: No  CIWA:    COWS:     Musculoskeletal: Strength & Muscle Tone: within normal limits Gait & Station: normal Patient leans: N/A  Psychiatric Specialty Exam:  Presentation  General Appearance:  Casual; Disheveled  Eye Contact: Fair  Speech: Clear and Coherent; Slow  Speech Volume: Decreased  Handedness: Right   Mood and Affect  Mood: Depressed  Affect: Blunt   Thought Process  Thought Processes: Linear  Descriptions of Associations:Intact  Orientation:Full (Time, Place and Person)  Thought  Content:Logical  History of Schizophrenia/Schizoaffective disorder:Yes  Duration of Psychotic Symptoms:Greater than six months  Hallucinations:Hallucinations: None   Ideas of Reference:None  Suicidal Thoughts:Suicidal Thoughts: No   Homicidal Thoughts:Homicidal Thoughts: No    Sensorium  Memory: Immediate Fair; Remote Fair; Recent Fair  Judgment: Fair  Insight: Fair   Art therapist  Concentration: Fair  Attention Span: Fair  Recall: [employer] of Knowledge: Fair  Language: Fair   Psychomotor Activity  Psychomotor Activity: Psychomotor Activity: Normal    Assets  Assets: Desire for Improvement; Communication Skills   Sleep  Sleep: Number of Hours of Sleep: 7     Physical Exam: General: Sitting comfortably. NAD. HEENT: Normocephalic, atraumatic, MMM, EMOI Lungs: no increased work of breathing noted Heart: no cyanosis Abdomen: Non distended Musculoskeletal: FROM. No obvious deformities Skin: Warm, dry, intact. No rashes noted Neuro: No obvious focal deficits.  Gait and station are normal  Review of Systems  Constitutional: Negative.   HENT: Negative.    Eyes: Negative.   Respiratory: Negative.    Cardiovascular: Negative.   Gastrointestinal: Negative.   Genitourinary: Negative.   Skin: Negative.   Neurological: Negative.   Psychiatric/Behavioral:  Positive for depression.     Blood pressure 107/81, pulse 76, temperature 97.6 F (36.4 C), temperature source Oral, resp. rate 20, height 4\' 11"  (1.499 m), weight 63 kg, SpO2 99%. Body mass index is 28.05 kg/m.   Treatment Plan Summary: Daily contact with patient to assess and evaluate symptoms and progress in treatment and Medication management.    Still waiting on safe disposition as patient would not be able to live independently given cognitive impairment. Recommend assisted living.  The patient's disposition remains unresolved.  No real progress on discharge  plans.  No medication side effects reported.  Because of  urinary incontinence, recommended adult diapers to be worn.   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  Plan:  -Continue Imitrex 25 mg PRN BID for migraines -Continue Colace 100 mg daily for constipation -Continue Vitamin D 50.000 units weekly for bone health. -Continue Sertraline150 mg po Q daily for depression/anxiety -Continue Buspar 15 mg po bid for anxiety.  -Continue paliperidone 6 mg po qd for mood stabilization -Continue Trazodone 50 mg nightly for insomnia  -Continue Nicoderm 21 mg topically Q 24 hrs for nicotine withdrawal management. -Continue vitamin B12 1000 mcg IM weekly for 4 weeks then monthly -Continue Melatonin 5 mg nightly for sleep -Continue Protonix EC 40 mg p.o. daily for GERD -Continue MiraLAX 17 g p.o. PRN for constipation -Continue hydroxyzine 25 mg p.o. 3 times daily as needed for anxiety -Continue Ensure nutritional shakes TID in between meals  -Previously discontinued Hydroxyzine 50 mg nightly-hypotension in the mornings   Safety and Monitoring: Voluntary admission to inpatient psychiatric unit for safety, stabilization and treatment Daily contact with patient to assess and evaluate symptoms and progress in treatment Patient's case to be discussed in multi-disciplinary team meeting Observation Level : q15 minute checks Vital signs: q12 hours Precautions: Safety   Discharge Planning: Social work and case management to assist with discharge planning and identification of hospital follow-up needs prior to discharge Estimated LOS: Unknown at this time. Discharge Concerns: Need to establish a safety plan; Medication compliance and effectiveness Discharge Goals:  Return home with outpatient referrals for mental health follow-up including medication management/psychotherapy No legal guardian, has payee  [name], MD 05/28/2023, 10:20 AM Patient ID: Yolanda [name], female   DOB: 26-Jun-1976, 47 y.o.   MRN: [id number] Patient ID: Yolanda [name], female   DOB: 1976-02-24, 47 y.o.   MRN: [id number]

## 2023-05-28 NOTE — Group Note (Signed)
Recreation Therapy Group Note   Group Topic:Animal Assisted Therapy   Group Date: 05/28/2023 Start Time: 1610 End Time: 1030 Facilitators: Osama Coleson-McCall, LRT,CTRS Location: [address] Dayroom   Animal-Assisted Activity (AAA) Program Checklist/Progress Notes Patient Eligibility Criteria Checklist & Daily Group note for Rec Tx Intervention  AAA/T Program Assumption of Risk Form signed by Patient/ or Parent Legal Guardian Yes  Patient understands his/her participation is voluntary Yes  Education: Charity fundraiser, Appropriate Animal Interaction   Education Outcome: Acknowledges education.    Affect/Mood: N/A   Participation Level: Did not attend    Clinical Observations/Individualized Feedback:     Plan: Continue to engage patient in RT group sessions 2-3x/week.   Anessia Oakland-McCall, LRT,CTRS 05/28/2023 1:06 PM

## 2023-05-29 ENCOUNTER — Encounter (HOSPITAL_COMMUNITY): Payer: Self-pay

## 2023-05-29 DIAGNOSIS — F251 Schizoaffective disorder, depressive type: Secondary | ICD-10-CM | POA: Diagnosis not present

## 2023-05-29 NOTE — Progress Notes (Signed)
[facility] MD Progress Note  05/29/2023 10:10 AM Verline RYELYN HENCH  MRN:  [id number] Subjective: Per nursing report, patient was compliant with medication overnight.  She received no PRNs medications last night.  Preeti was seen in her room during rounds.  Unfortunately there is no change at all since yesterday.  Patient continues to remain somewhat withdrawn and periodically attending groups.  However her hospitalization has been prolonged so long that patient seems frustrated at times but has not demonstrated any agitation or acting out behavior.  She is also not demonstrate any active suicidal or homicidal ideations. She continues to contract for safety and remains hopeful for placement.  Patient is not capable of living independently and social work continues to wait on group home to finalize financial details for placement.    Principal Problem: Schizoaffective disorder, depressive type (HCC) Diagnosis: Principal Problem:   Schizoaffective disorder, depressive type (HCC) Active Problems:   GERD (gastroesophageal reflux disease)   Intellectual disability   Tobacco use disorder  Total Time spent with patient: 20 minutes  Past Psychiatric History: see H&P  Past Medical History:  Past Medical History:  Diagnosis Date   Bipolar affect, depressed (HCC)    Constipation 08/17/2022   Depression    Falls 07/21/2022   Fracture of femoral neck, right, closed (HCC) 01/17/2022   Herpes simplex 08/22/2017   Open fracture dislocation of right elbow joint 01/17/2022    Past Surgical History:  Procedure Laterality Date   NO PAST SURGERIES     SALPINGECTOMY     Family History: History reviewed. No pertinent family history. Family Psychiatric  History: see H&P Social History:  Social History   Substance and Sexual Activity  Alcohol Use Yes     Social History   Substance and Sexual Activity  Drug Use Yes   Types: Cocaine, Marijuana    Social History   Socioeconomic History    Marital status: Single    Spouse name: Not on file   Number of children: Not on file   Years of education: Not on file   Highest education level: Not on file  Occupational History   Not on file  Tobacco Use   Smoking status: Every Day   Smokeless tobacco: Not on file  Substance and Sexual Activity   Alcohol use: Yes   Drug use: Yes    Types: Cocaine, Marijuana   Sexual activity: Yes  Other Topics Concern   Not on file  Social History Narrative   Not on file   Social Determinants of Health   Financial Resource Strain: Low Risk  (09/12/2022)   Received from [facility], [facility]   Overall Financial Resource Strain (CARDIA)    Difficulty of Paying Living Expenses: Not hard at all  Food Insecurity: Patient Declined (12/20/2022)   Hunger Vital Sign    Worried About Running Out of Food in the Last Year: Patient declined    Ran Out of Food in the Last Year: Patient declined  Transportation Needs: No Transportation Needs (12/20/2022)   PRAPARE - Administrator, Civil Service (Medical): No    Lack of Transportation (Non-Medical): No  Physical Activity: Not on file  Stress: No Stress Concern Present (07/17/2022)   Received from [facility], [facility] of Occupational Health - Occupational Stress Questionnaire    Feeling of Stress : Not at all  Social Connections: Unknown (07/16/2022)   Received from [facility], [facility]   Social Network  Social Network: Not on file   Additional Social History:                         Sleep: Good  Appetite:  Poor  Current Medications: Current Facility-Administered Medications  Medication Dose Route Frequency Provider Last Rate Last Admin   acetaminophen (TYLENOL) tablet 650 mg  650 mg Oral Q6H PRN [name], NP   650 mg at 05/28/23 1807   alum & mag hydroxide-simeth (MAALOX/MYLANTA) 200-200-20 MG/5ML suspension 30 mL  30 mL Oral Q4H PRN [name], NP   30 mL at 01/28/23 1530    busPIRone (BUSPAR) tablet 15 mg  15 mg Oral BID [name], NP   15 mg at 05/29/23 1610   cyanocobalamin (VITAMIN B12) injection 1,000 mcg  1,000 mcg Intramuscular Q30 days Sarita Bottom, MD   1,000 mcg at 05/03/23 1257   diphenhydrAMINE (BENADRYL) capsule 50 mg  50 mg Oral TID PRN [name], NP   50 mg at 01/15/23 1211   Or   diphenhydrAMINE (BENADRYL) injection 50 mg  50 mg Intramuscular TID PRN [name], NP       docusate sodium (COLACE) capsule 100 mg  100 mg Oral Daily [name], NP   100 mg at 05/29/23 0743   feeding supplement (ENSURE ENLIVE / ENSURE PLUS) liquid 237 mL  237 mL Oral TID BM [name], NP   237 mL at 05/28/23 2114   haloperidol (HALDOL) tablet 5 mg  5 mg Oral TID PRN [name], NP   5 mg at 05/04/23 9604   Or   haloperidol lactate (HALDOL) injection 5 mg  5 mg Intramuscular TID PRN [name], NP       hydrOXYzine (ATARAX) tablet 25 mg  25 mg Oral TID PRN [name], DO   25 mg at 05/27/23 1146   LORazepam (ATIVAN) tablet 2 mg  2 mg Oral TID PRN [name], NP   2 mg at 01/15/23 1211   Or   LORazepam (ATIVAN) injection 2 mg  2 mg Intramuscular TID PRN [name], NP       magnesium hydroxide (MILK OF MAGNESIA) suspension 30 mL  30 mL Oral Daily PRN [name], NP   30 mL at 04/13/23 0814   melatonin tablet 5 mg  5 mg Oral QHS Nkwenti, Doris, NP   5 mg at 05/28/23 2114   nicotine polacrilex (NICORETTE) gum 2 mg  2 mg Oral PRN [name], MD   2 mg at 05/27/23 1146   paliperidone (INVEGA) 24 hr tablet 6 mg  6 mg Oral Daily [name], NP   6 mg at 05/28/23 2114   pantoprazole (PROTONIX) EC tablet 40 mg  40 mg Oral Daily [name], NP   40 mg at 05/29/23 0743   polyethylene glycol (MIRALAX / GLYCOLAX) packet 17 g  17 g Oral Daily PRN [name], NP   17 g at 04/13/23 1734   sertraline (ZOLOFT) tablet 150 mg  150 mg Oral Daily Massengill, Harrold Donath, MD   150 mg at 05/29/23 0743   SUMAtriptan (IMITREX) tablet 25 mg  25  mg Oral BID PRN [name], NP   25 mg at 05/07/23 2112   traZODone (DESYREL) tablet 50 mg  50 mg Oral QHS [name], NP   50 mg at 05/28/23 2114   Vitamin D (Ergocalciferol) (DRISDOL) 1.25 MG (50000 UNIT) capsule 50,000 Units  50,000 Units Oral Q7  days [name], NP   50,000 Units at 05/25/23 1744    Lab Results:  No results found for this or any previous visit (from the past 48 hour(s)).   Blood Alcohol level:  Lab Results  Component Value Date   ETH <10 12/19/2022   ETH <10 11/21/2019    Metabolic Disorder Labs: Lab Results  Component Value Date   HGBA1C 4.4 (L) 12/21/2022   MPG 79.58 12/21/2022   No results found for: "PROLACTIN" Lab Results  Component Value Date   CHOL 218 (H) 12/21/2022   TRIG 81 12/21/2022   HDL 53 12/21/2022   CHOLHDL 4.1 12/21/2022   VLDL 16 12/21/2022   LDLCALC 149 (H) 12/21/2022   LDLCALC 110 (H) 03/13/2011    Physical Findings: AIMS: Facial and Oral Movements Muscles of Facial Expression: None Lips and Perioral Area: None Jaw: None Tongue: None,Extremity Movements Upper (arms, wrists, hands, fingers): None Lower (legs, knees, ankles, toes): None, Trunk Movements Neck, shoulders, hips: None, Global Judgements Severity of abnormal movements overall : None Incapacitation due to abnormal movements: None Patient's awareness of abnormal movements: No Awareness, Dental Status Current problems with teeth and/or dentures?: No Does patient usually wear dentures?: No  CIWA:    COWS:     Musculoskeletal: Strength & Muscle Tone: within normal limits Gait & Station: normal Patient leans: N/A  Psychiatric Specialty Exam:  Presentation  General Appearance:  Casual; Disheveled  Eye Contact: Fair  Speech: Clear and Coherent; Slow  Speech Volume: Decreased  Handedness: Right   Mood and Affect  Mood: Depressed  Affect: Blunt   Thought Process  Thought Processes: Linear  Descriptions of  Associations:Intact  Orientation:Full (Time, Place and Person)  Thought Content:Logical  History of Schizophrenia/Schizoaffective disorder:Yes  Duration of Psychotic Symptoms:Greater than six months  Hallucinations:No data recorded   Ideas of Reference:None  Suicidal Thoughts:No data recorded   Homicidal Thoughts:No data recorded    Sensorium  Memory: Immediate Fair; Remote Fair; Recent Fair  Judgment: Fair  Insight: Fair   Art therapist  Concentration: Fair  Attention Span: Fair  Recall: [employer] of Knowledge: Fair  Language: Fair   Psychomotor Activity  Psychomotor Activity: No data recorded    Assets  Assets: Desire for Improvement; Communication Skills   Sleep  Sleep: No data recorded     Physical Exam: General: Sitting comfortably. NAD. HEENT: Normocephalic, atraumatic, MMM, EMOI Lungs: no increased work of breathing noted Heart: no cyanosis Abdomen: Non distended Musculoskeletal: FROM. No obvious deformities Skin: Warm, dry, intact. No rashes noted Neuro: No obvious focal deficits.  Gait and station are normal  Review of Systems  Constitutional: Negative.   HENT: Negative.    Eyes: Negative.   Respiratory: Negative.    Cardiovascular: Negative.   Gastrointestinal: Negative.   Genitourinary: Negative.   Skin: Negative.   Neurological: Negative.   Psychiatric/Behavioral:  Positive for depression.     Blood pressure 99/75, pulse 70, temperature 97.9 F (36.6 C), temperature source Oral, resp. rate 18, height 4\' 11"  (1.499 m), weight 63 kg, SpO2 98%. Body mass index is 28.05 kg/m.   Treatment Plan Summary: Daily contact with patient to assess and evaluate symptoms and progress in treatment and Medication management.    Still waiting on safe disposition as patient would not be able to live independently given cognitive impairment. Recommend assisted living.  The patient's disposition remains unresolved.  No  real progress on discharge plans.  No medication side effects reported.  Because of urinary incontinence,  recommended adult diapers to be worn.   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  Plan:  -Continue Imitrex 25 mg PRN BID for migraines -Continue Colace 100 mg daily for constipation -Continue Vitamin D 50.000 units weekly for bone health. -Continue Sertraline150 mg po Q daily for depression/anxiety -Continue Buspar 15 mg po bid for anxiety.  -Continue paliperidone 6 mg po qd for mood stabilization -Continue Trazodone 50 mg nightly for insomnia  -Continue Nicoderm 21 mg topically Q 24 hrs for nicotine withdrawal management. -Continue vitamin B12 1000 mcg IM weekly for 4 weeks then monthly -Continue Melatonin 5 mg nightly for sleep -Continue Protonix EC 40 mg p.o. daily for GERD -Continue MiraLAX 17 g p.o. PRN for constipation -Continue hydroxyzine 25 mg p.o. 3 times daily as needed for anxiety -Continue Ensure nutritional shakes TID in between meals  -Previously discontinued Hydroxyzine 50 mg nightly-hypotension in the mornings   Safety and Monitoring: Voluntary admission to inpatient psychiatric unit for safety, stabilization and treatment Daily contact with patient to assess and evaluate symptoms and progress in treatment Patient's case to be discussed in multi-disciplinary team meeting Observation Level : q15 minute checks Vital signs: q12 hours Precautions: Safety   Discharge Planning: Social work and case management to assist with discharge planning and identification of hospital follow-up needs prior to discharge Estimated LOS: Unknown at this time. Discharge Concerns: Need to establish a safety plan; Medication compliance and  effectiveness Discharge Goals: Return home with outpatient referrals for mental health follow-up including medication management/psychotherapy No legal guardian, has payee  [name], MD 05/29/2023, 10:10 AM Patient ID: Larey Brick, female   DOB: Sep 25, 1975, 47 y.o.   MRN: [id number]

## 2023-05-29 NOTE — [facility] Group Notes (Signed)
[facility] Group Notes:  (Nursing/MHT/Case Management/Adjunct)  Date:  05/29/2023  Time:  9:40 PM  Type of Therapy:   NA group  Participation Level:  Did Not Attend  Participation Quality:    Affect:    Cognitive:    Insight:    Engagement in Group:    Modes of Intervention:    Summary of Progress/Problems:Pt didn't attend NA meeting. Pt worked on Orthoptist in her room.   Noah Delaine 05/29/2023, 9:40 PM

## 2023-05-29 NOTE — BH IP Treatment Plan (Signed)
Interdisciplinary Treatment and Diagnostic Plan Update  05/29/2023 Time of Session: 11:35AM - UPDATE Yolanda [name] MRN: [id number]  Principal Diagnosis: Schizoaffective disorder, depressive type (HCC)  Secondary Diagnoses: Principal Problem:   Schizoaffective disorder, depressive type (HCC) Active Problems:   GERD (gastroesophageal reflux disease)   Intellectual disability   Tobacco use disorder   Current Medications:  Current Facility-Administered Medications  Medication Dose Route Frequency Provider Last Rate Last Admin   acetaminophen (TYLENOL) tablet 650 mg  650 mg Oral Q6H PRN [name], NP   650 mg at 05/28/23 1807   alum & mag hydroxide-simeth (MAALOX/MYLANTA) 200-200-20 MG/5ML suspension 30 mL  30 mL Oral Q4H PRN [name], NP   30 mL at 01/28/23 1530   busPIRone (BUSPAR) tablet 15 mg  15 mg Oral BID [name], NP   15 mg at 05/29/23 8295   cyanocobalamin (VITAMIN B12) injection 1,000 mcg  1,000 mcg Intramuscular Q30 days Sarita Bottom, MD   1,000 mcg at 05/03/23 1257   diphenhydrAMINE (BENADRYL) capsule 50 mg  50 mg Oral TID PRN [name], NP   50 mg at 01/15/23 1211   Or   diphenhydrAMINE (BENADRYL) injection 50 mg  50 mg Intramuscular TID PRN [name], NP       docusate sodium (COLACE) capsule 100 mg  100 mg Oral Daily [name], NP   100 mg at 05/29/23 0743   feeding supplement (ENSURE ENLIVE / ENSURE PLUS) liquid 237 mL  237 mL Oral TID BM [name], NP   237 mL at 05/28/23 2114   haloperidol (HALDOL) tablet 5 mg  5 mg Oral TID PRN [name], NP   5 mg at 05/04/23 6213   Or   haloperidol lactate (HALDOL) injection 5 mg  5 mg Intramuscular TID PRN [name], NP       hydrOXYzine (ATARAX) tablet 25 mg  25 mg Oral TID PRN [name], DO   25 mg at 05/27/23 1146   LORazepam (ATIVAN) tablet 2 mg  2 mg Oral TID PRN [name], NP   2 mg at 01/15/23 1211   Or   LORazepam (ATIVAN) injection 2 mg  2 mg Intramuscular TID PRN  [name], NP       magnesium hydroxide (MILK OF MAGNESIA) suspension 30 mL  30 mL Oral Daily PRN [name], NP   30 mL at 04/13/23 0814   melatonin tablet 5 mg  5 mg Oral QHS Nkwenti, Doris, NP   5 mg at 05/28/23 2114   nicotine polacrilex (NICORETTE) gum 2 mg  2 mg Oral PRN [name], MD   2 mg at 05/29/23 1307   paliperidone (INVEGA) 24 hr tablet 6 mg  6 mg Oral Daily [name], NP   6 mg at 05/28/23 2114   pantoprazole (PROTONIX) EC tablet 40 mg  40 mg Oral Daily Nwoko, Nicole Kindred I, NP   40 mg at 05/29/23 0743   polyethylene glycol (MIRALAX / GLYCOLAX) packet 17 g  17 g Oral Daily PRN [name], NP   17 g at 04/13/23 1734   sertraline (ZOLOFT) tablet 150 mg  150 mg Oral Daily Massengill, Harrold Donath, MD   150 mg at 05/29/23 0743   SUMAtriptan (IMITREX) tablet 25 mg  25 mg Oral BID PRN [name], NP   25 mg at 05/07/23 2112   traZODone (DESYREL) tablet 50 mg  50 mg Oral QHS [name], NP   50 mg at 05/28/23 2114  Vitamin D (Ergocalciferol) (DRISDOL) 1.25 MG (50000 UNIT) capsule 50,000 Units  50,000 Units Oral Q7 days [name], NP   50,000 Units at 05/25/23 1744   PTA Medications: Medications Prior to Admission  Medication Sig Dispense Refill Last Dose   busPIRone (BUSPAR) 15 MG tablet Take 15 mg by mouth 2 (two) times daily. (Patient not taking: Reported on 12/19/2022)      paliperidone (INVEGA SUSTENNA) 156 MG/ML SUSY injection Inject 156 mg into the muscle once. (Patient not taking: Reported on 12/19/2022)      sertraline (ZOLOFT) 50 MG tablet Take 150 mg by mouth daily. (Patient not taking: Reported on 12/19/2022)      traZODone (DESYREL) 100 MG tablet Take 100 mg by mouth at bedtime as needed for sleep. (Patient not taking: Reported on 11/12/2022)       Patient Stressors: Medication change or noncompliance    Patient Strengths: Forensic psychologist fund of knowledge   Treatment Modalities: Medication Management, Group therapy, Case management,  1  to 1 session with clinician, Psychoeducation, Recreational therapy.   Physician Treatment Plan for Primary Diagnosis: Schizoaffective disorder, depressive type (HCC) Long Term Goal(s): Improvement in symptoms so as ready for discharge   Short Term Goals: Ability to identify and develop effective coping behaviors will improve Ability to maintain clinical measurements within normal limits will improve Compliance with prescribed medications will improve Ability to identify triggers associated with substance abuse/mental health issues will improve Ability to identify changes in lifestyle to reduce recurrence of condition will improve Ability to verbalize feelings will improve Ability to disclose and discuss suicidal ideas Ability to demonstrate self-control will improve  Medication Management: Evaluate patient's response, side effects, and tolerance of medication regimen.  Therapeutic Interventions: 1 to 1 sessions, Unit Group sessions and Medication administration.  Evaluation of Outcomes: Progressing  Physician Treatment Plan for Secondary Diagnosis: Principal Problem:   Schizoaffective disorder, depressive type (HCC) Active Problems:   GERD (gastroesophageal reflux disease)   Intellectual disability   Tobacco use disorder  Long Term Goal(s): Improvement in symptoms so as ready for discharge   Short Term Goals: Ability to identify and develop effective coping behaviors will improve Ability to maintain clinical measurements within normal limits will improve Compliance with prescribed medications will improve Ability to identify triggers associated with substance abuse/mental health issues will improve Ability to identify changes in lifestyle to reduce recurrence of condition will improve Ability to verbalize feelings will improve Ability to disclose and discuss suicidal ideas Ability to demonstrate self-control will improve     Medication Management: Evaluate patient's response,  side effects, and tolerance of medication regimen.  Therapeutic Interventions: 1 to 1 sessions, Unit Group sessions and Medication administration.  Evaluation of Outcomes: Progressing   RN Treatment Plan for Primary Diagnosis: Schizoaffective disorder, depressive type (HCC) Long Term Goal(s): Knowledge of disease and therapeutic regimen to maintain health will improve  Short Term Goals: Ability to remain free from injury will improve, Ability to verbalize frustration and anger appropriately will improve, Ability to participate in decision making will improve, Ability to verbalize feelings will improve, Ability to identify and develop effective coping behaviors will improve, and Compliance with prescribed medications will improve  Medication Management: RN will administer medications as ordered by provider, will assess and evaluate patient's response and provide education to patient for prescribed medication. RN will report any adverse and/or side effects to prescribing provider.  Therapeutic Interventions: 1 on 1 counseling sessions, Psychoeducation, Medication administration, Evaluate responses to treatment, Monitor vital  signs and CBGs as ordered, Perform/monitor CIWA, COWS, AIMS and Fall Risk screenings as ordered, Perform wound care treatments as ordered.  Evaluation of Outcomes: Progressing   LCSW Treatment Plan for Primary Diagnosis: Schizoaffective disorder, depressive type (HCC) Long Term Goal(s): Safe transition to appropriate next level of care at discharge, Engage patient in therapeutic group addressing interpersonal concerns.  Short Term Goals: Engage patient in aftercare planning with referrals and resources, Increase social support, Increase emotional regulation, Facilitate acceptance of mental health diagnosis and concerns, Identify triggers associated with mental health/substance abuse issues, and Increase skills for wellness and recovery  Therapeutic Interventions: Assess for  all discharge needs, 1 to 1 time with Social worker, Explore available resources and support systems, Assess for adequacy in community support network, Educate family and significant other(s) on suicide prevention, Complete Psychosocial Assessment, Interpersonal group therapy.  Evaluation of Outcomes: Progressing   Progress in Treatment: Attending groups: Yes. Participating in groups: Yes. Taking medication as prescribed: Yes. Toleration medication: Yes. Family/Significant other contact made: Yes, individual(s) contacted:  DSS ( [place] guardian )  Patient understands diagnosis: Yes. Discussing patient identified problems/goals with staff: Yes. Medical problems stabilized or resolved: Yes. Denies suicidal/homicidal ideation: Yes. Issues/concerns per patient self-inventory: No.   New problem(s) identified: No, Describe:  None reported    New Short Term/Long Term Goal(s):   medication stabilization, elimination of SI thoughts, development of comprehensive mental wellness plan.     Patient Goals:  " placement "    Discharge Plan or Barriers: Patient recently admitted. CSW will continue to follow and assess for appropriate referrals and possible discharge planning.  Pt is awaiting to be placed at [facility] in [place]    Reason for Continuation of Hospitalization: Anxiety Depression Medication stabilization Other; describe Placement    Estimated Length of Stay: waiting on her Medicaid B to be converted to Med C  Last 3 [place] Suicide Severity Risk Score: Flowsheet Row Ad[facility] (Current) [facility] from 12/19/2022 in [facility] Department at [facility] ED from [facility]/12/2022 in [facility] at [facility]  C-SSRS RISK CATEGORY High Risk High Risk Moderate Risk       Last [facility] 2/9 Scores:     No data to display          Scribe for Treatment Team: Kathi Der,  LCSWA 05/29/2023 3:10 PM

## 2023-05-29 NOTE — Plan of Care (Signed)
  Problem: Coping: Goal: Coping ability will improve Outcome: Progressing Goal: Will verbalize feelings Outcome: Progressing   Problem: Health Behavior/Discharge Planning: Goal: Compliance with therapeutic regimen will improve Outcome: Progressing

## 2023-05-29 NOTE — Progress Notes (Signed)
   05/29/23 2315  Psych Admission Type (Psych Patients Only)  Admission Status Involuntary  Psychosocial Assessment  Patient Complaints Sadness  Eye Contact Fair  Facial Expression Flat;Sad  Affect Depressed  Speech Soft;Slow  Interaction Childlike;Cautious  Motor Activity Slow  Appearance/Hygiene Disheveled  Behavior Characteristics Cooperative  Mood Depressed  Aggressive Behavior  Effect No apparent injury  Thought Process  Coherency WDL  Content WDL  Delusions WDL  Perception WDL  Hallucination None reported or observed  Judgment Impaired  Confusion None  Danger to Self  Current suicidal ideation? Denies  Danger to Others  Danger to Others None reported or observed

## 2023-05-29 NOTE — Plan of Care (Signed)
  Problem: Coping: Goal: Coping ability will improve Outcome: Progressing Goal: Will verbalize feelings Outcome: Progressing   

## 2023-05-29 NOTE — Group Note (Signed)
Recreation Therapy Group Note   Group Topic:Team Building  Group Date: 05/29/2023 Start Time: 0935 End Time: 1002 Facilitators: Morning Halberg-McCall, LRT,CTRS Location: [address] Dayroom   Group Topic: Communication, Team Building, Problem Solving  Goal Area(s) Addresses:  Patient will effectively work with peer towards shared goal.  Patient will identify skills used to make activity successful.  Patient will identify how skills used during activity can be applied to reach post d/c goals.   Intervention: STEM Activity- Glass blower/designer  Group Description: Tallest Pharmacist, community. In teams of 5-6, patients were given 11 craft pipe cleaners. Using the materials provided, patients were instructed to compete again the opposing team(s) to build the tallest free-standing structure from floor level. The activity was timed; difficulty increased by Clinical research associate as Production designer, theatre/television/film continued.  Systematically resources were removed with additional directions for example, placing one arm behind their back, working in silence, and shape stipulations. LRT facilitated post-activity discussion reviewing team processes and necessary communication skills involved in completion. Patients were encouraged to reflect how the skills utilized, or not utilized, in this activity can be incorporated to positively impact support systems post discharge.  Education: Pharmacist, community, Scientist, physiological, Discharge Planning   Education Outcome: Acknowledges education/In group clarification offered/Needs additional education.    Affect/Mood: N/A   Participation Level: Did not attend    Clinical Observations/Individualized Feedback:     Plan: Continue to engage patient in RT group sessions 2-3x/week.   Jacaden Forbush-McCall, LRT,CTRS  05/29/2023 11:52 AM

## 2023-05-29 NOTE — Progress Notes (Signed)
   05/29/23 0800  Psych Admission Type (Psych Patients Only)  Admission Status Involuntary  Psychosocial Assessment  Patient Complaints Depression;Sadness  Eye Contact Fair  Facial Expression Sad;Flat  Affect Depressed  Speech Soft;Slow  Interaction Childlike  Motor Activity Slow  Appearance/Hygiene Disheveled  Behavior Characteristics Cooperative  Mood Depressed;Sad  Thought Process  Coherency WDL  Content WDL  Delusions WDL  Perception WDL  Hallucination None reported or observed  Judgment Impaired  Confusion None  Danger to Self  Current suicidal ideation? Denies  Description of Suicide Plan No plan  Self-Injurious Behavior No self-injurious ideation or behavior indicators observed or expressed   Agreement Not to Harm Self Yes  Description of Agreement Verbal  Danger to Others  Danger to Others None reported or observed

## 2023-05-30 DIAGNOSIS — F251 Schizoaffective disorder, depressive type: Secondary | ICD-10-CM | POA: Diagnosis not present

## 2023-05-30 NOTE — Plan of Care (Signed)
  Problem: Activity: Goal: Interest or engagement in leisure activities will improve Outcome: Progressing   Problem: Coping: Goal: Coping ability will improve Outcome: Progressing Goal: Will verbalize feelings Outcome: Progressing   Problem: Health Behavior/Discharge Planning: Goal: Compliance with therapeutic regimen will improve Outcome: Progressing

## 2023-05-30 NOTE — [facility] Group Notes (Signed)
[facility] Group Notes:  (Nursing/MHT/Case Management/Adjunct)  Date:  05/30/2023  Time:  9:42 PM  Type of Therapy:   Wrap-up group  Participation Level:  Active  Participation Quality:  Appropriate  Affect:  Appropriate  Cognitive:  Appropriate  Insight:  Good  Engagement in Group:  Engaged  Modes of Intervention:  Education  Summary of Progress/Problems: Goal get D/C date. Goal not met. Rated dsy 7/10.  Yolanda [name] 05/30/2023, 9:42 PM

## 2023-05-30 NOTE — Group Note (Signed)
LCSW Group Therapy Note   Group Date: 05/30/2023 Start Time: 1100 End Time: 1200   Type of Therapy and Topic:  Group Therapy: Boundaries  Participation Level:  Did Not Attend  Description of Group: This group will address the use of boundaries in their personal lives. Patients will explore why boundaries are important, the difference between healthy and unhealthy boundaries, and negative and postive outcomes of different boundaries and will look at how boundaries can be crossed.  Patients will be encouraged to identify current boundaries in their own lives and identify what kind of boundary is being set. Facilitators will guide patients in utilizing problem-solving interventions to address and correct types boundaries being used and to address when no boundary is being used. Understanding and applying boundaries will be explored and addressed for obtaining and maintaining a balanced life. Patients will be encouraged to explore ways to assertively make their boundaries and needs known to significant others in their lives, using other group members and facilitator for role play, support, and feedback.  Therapeutic Goals:  1.  Patient will identify areas in their life where setting clear boundaries could be  used to improve their life.  2.  Patient will identify signs/triggers that a boundary is not being respected. 3.  Patient will identify two ways to set boundaries in order to achieve balance in  their lives: 4.  Patient will demonstrate ability to communicate their needs and set boundaries  through discussion and/or role plays  Summary of Patient Progress:  Did not attend  Therapeutic Modalities:   Cognitive Behavioral Therapy Solution-Focused Therapy  Kathi Der, LCSWA 05/30/2023  12:10 PM

## 2023-05-30 NOTE — Progress Notes (Signed)
   05/30/23 2045  Psych Admission Type (Psych Patients Only)  Admission Status Involuntary  Psychosocial Assessment  Patient Complaints Depression;Sadness  Eye Contact Fair  Facial Expression Flat;Sad  Affect Depressed  Speech Soft;Slow  Interaction Childlike;Cautious  Motor Activity Slow  Appearance/Hygiene Disheveled  Behavior Characteristics Anxious;Cooperative  Mood Anxious;Depressed  Aggressive Behavior  Effect No apparent injury  Thought Process  Coherency WDL  Content WDL  Delusions WDL  Perception WDL  Hallucination None reported or observed  Judgment Impaired  Confusion None  Danger to Self  Current suicidal ideation? Denies  Danger to Others  Danger to Others None reported or observed

## 2023-05-30 NOTE — Plan of Care (Signed)
  Problem: Activity: Goal: Interest or engagement in leisure activities will improve Outcome: Progressing   Problem: Coping: Goal: Coping ability will improve Outcome: Progressing Goal: Will verbalize feelings Outcome: Progressing   

## 2023-05-30 NOTE — Progress Notes (Signed)
[facility] MD Progress Note  05/30/2023 12:48 PM Yolanda [name]  MRN:  [id number] Subjective: Per nursing report, patient was compliant with medication overnight.  She received no PRNs medications last night.  She slept 10 hours last night.  Yolanda [name] was seen in her room during rounds.  She remains somewhat isolated to her room.  Staff reports no behavioral issues but patient has been alert oriented cooperative but somewhat sedated.  She does acknowledge some depression and frustration at not being placed yet but continues to be hopeful.  No progress yet from her caseworker.  Patient is not capable of living independently and social work continues to wait on group home to finalize financial details for placement.  She slept 10 hours last night.    Principal Problem: Schizoaffective disorder, depressive type (HCC) Diagnosis: Principal Problem:   Schizoaffective disorder, depressive type (HCC) Active Problems:   GERD (gastroesophageal reflux disease)   Intellectual disability   Tobacco use disorder  Total Time spent with patient: 20 minutes  Past Psychiatric History: see H&P  Past Medical History:  Past Medical History:  Diagnosis Date   Bipolar affect, depressed (HCC)    Constipation 08/17/2022   Depression    Falls 07/21/2022   Fracture of femoral neck, right, closed (HCC) 01/17/2022   Herpes simplex 08/22/2017   Open fracture dislocation of right elbow joint 01/17/2022    Past Surgical History:  Procedure Laterality Date   NO PAST SURGERIES     SALPINGECTOMY     Family History: History reviewed. No pertinent family history. Family Psychiatric  History: see H&P Social History:  Social History   Substance and Sexual Activity  Alcohol Use Yes     Social History   Substance and Sexual Activity  Drug Use Yes   Types: Cocaine, Marijuana    Social History   Socioeconomic History   Marital status: Single    Spouse name: Not on file   Number of children: Not on file    Years of education: Not on file   Highest education level: Not on file  Occupational History   Not on file  Tobacco Use   Smoking status: Every Day   Smokeless tobacco: Not on file  Substance and Sexual Activity   Alcohol use: Yes   Drug use: Yes    Types: Cocaine, Marijuana   Sexual activity: Yes  Other Topics Concern   Not on file  Social History Narrative   Not on file   Social Determinants of Health   Financial Resource Strain: Low Risk  (09/12/2022)   Received from [facility], [facility]   Overall Financial Resource Strain (CARDIA)    Difficulty of Paying Living Expenses: Not hard at all  Food Insecurity: Patient Declined (12/20/2022)   Hunger Vital Sign    Worried About Running Out of Food in the Last Year: Patient declined    Ran Out of Food in the Last Year: Patient declined  Transportation Needs: No Transportation Needs (12/20/2022)   PRAPARE - Administrator, Civil Service (Medical): No    Lack of Transportation (Non-Medical): No  Physical Activity: Not on file  Stress: No Stress Concern Present (07/17/2022)   Received from [facility], [facility] - Occupational Stress Questionnaire    Feeling of Stress : Not at all  Social Connections: Unknown (07/16/2022)   Received from [facility], [facility]   Social Network    Social Network: Not on file  Additional Social History:                         Sleep: Good  Appetite:  Poor  Current Medications: Current Facility-Administered Medications  Medication Dose Route Frequency Provider Last Rate Last Admin   acetaminophen (TYLENOL) tablet 650 mg  650 mg Oral Q6H PRN [name], NP   650 mg at 05/29/23 1906   alum & mag hydroxide-simeth (MAALOX/MYLANTA) 200-200-20 MG/5ML suspension 30 mL  30 mL Oral Q4H PRN [name], NP   30 mL at 01/28/23 1530   busPIRone (BUSPAR) tablet 15 mg  15 mg Oral BID [name], NP   15 mg at 05/30/23  0740   cyanocobalamin (VITAMIN B12) injection 1,000 mcg  1,000 mcg Intramuscular Q30 days Sarita Bottom, MD   1,000 mcg at 05/03/23 1257   diphenhydrAMINE (BENADRYL) capsule 50 mg  50 mg Oral TID PRN [name], NP   50 mg at 01/15/23 1211   Or   diphenhydrAMINE (BENADRYL) injection 50 mg  50 mg Intramuscular TID PRN [name], NP       docusate sodium (COLACE) capsule 100 mg  100 mg Oral Daily [name], NP   100 mg at 05/30/23 0740   feeding supplement (ENSURE ENLIVE / ENSURE PLUS) liquid 237 mL  237 mL Oral TID BM [name], NP   237 mL at 05/29/23 2116   haloperidol (HALDOL) tablet 5 mg  5 mg Oral TID PRN [name], NP   5 mg at 05/04/23 6578   Or   haloperidol lactate (HALDOL) injection 5 mg  5 mg Intramuscular TID PRN [name], NP       hydrOXYzine (ATARAX) tablet 25 mg  25 mg Oral TID PRN [name], DO   25 mg at 05/27/23 1146   LORazepam (ATIVAN) tablet 2 mg  2 mg Oral TID PRN [name], NP   2 mg at 01/15/23 1211   Or   LORazepam (ATIVAN) injection 2 mg  2 mg Intramuscular TID PRN [name], NP       magnesium hydroxide (MILK OF MAGNESIA) suspension 30 mL  30 mL Oral Daily PRN [name], NP   30 mL at 04/13/23 0814   melatonin tablet 5 mg  5 mg Oral QHS Nkwenti, Doris, NP   5 mg at 05/29/23 2115   nicotine polacrilex (NICORETTE) gum 2 mg  2 mg Oral PRN [name], MD   2 mg at 05/29/23 1307   paliperidone (INVEGA) 24 hr tablet 6 mg  6 mg Oral Daily [name], NP   6 mg at 05/29/23 2115   pantoprazole (PROTONIX) EC tablet 40 mg  40 mg Oral Daily [name], NP   40 mg at 05/30/23 0740   polyethylene glycol (MIRALAX / GLYCOLAX) packet 17 g  17 g Oral Daily PRN [name], NP   17 g at 04/13/23 1734   sertraline (ZOLOFT) tablet 150 mg  150 mg Oral Daily Massengill, Harrold Donath, MD   150 mg at 05/30/23 0740   SUMAtriptan (IMITREX) tablet 25 mg  25 mg Oral BID PRN [name], NP   25 mg at 05/07/23 2112   traZODone (DESYREL)  tablet 50 mg  50 mg Oral QHS [name], NP   50 mg at 05/29/23 2116   Vitamin D (Ergocalciferol) (DRISDOL) 1.25 MG (50000 UNIT) capsule 50,000 Units  50,000 Units Oral Q7 days [name], NP   50,000  Units at 05/25/23 1744    Lab Results:  No results found for this or any previous visit (from the past 48 hour(s)).   Blood Alcohol level:  Lab Results  Component Value Date   ETH <10 12/19/2022   ETH <10 11/21/2019    Metabolic Disorder Labs: Lab Results  Component Value Date   HGBA1C 4.4 (L) 12/21/2022   MPG 79.58 12/21/2022   No results found for: "PROLACTIN" Lab Results  Component Value Date   CHOL 218 (H) 12/21/2022   TRIG 81 12/21/2022   HDL 53 12/21/2022   CHOLHDL 4.1 12/21/2022   VLDL 16 12/21/2022   LDLCALC 149 (H) 12/21/2022   LDLCALC 110 (H) 03/13/2011    Physical Findings: AIMS: Facial and Oral Movements Muscles of Facial Expression: None Lips and Perioral Area: None Jaw: None Tongue: None,Extremity Movements Upper (arms, wrists, hands, fingers): None Lower (legs, knees, ankles, toes): None, Trunk Movements Neck, shoulders, hips: None, Global Judgements Severity of abnormal movements overall : None Incapacitation due to abnormal movements: None Patient's awareness of abnormal movements: No Awareness, Dental Status Current problems with teeth and/or dentures?: No Does patient usually wear dentures?: No  CIWA:    COWS:     Musculoskeletal: Strength & Muscle Tone: within normal limits Gait & Station: normal Patient leans: N/A  Psychiatric Specialty Exam:  Presentation  General Appearance:  Casual; Disheveled  Eye Contact: Fair  Speech: Clear and Coherent; Slow  Speech Volume: Decreased  Handedness: Right   Mood and Affect  Mood: Depressed  Affect: Blunt   Thought Process  Thought Processes: Linear  Descriptions of Associations:Intact  Orientation:Full (Time, Place and Person)  Thought Content:Logical  History of  Schizophrenia/Schizoaffective disorder:Yes  Duration of Psychotic Symptoms:Greater than six months  Hallucinations:No data recorded   Ideas of Reference:None  Suicidal Thoughts:No data recorded   Homicidal Thoughts:No data recorded    Sensorium  Memory: Immediate Fair; Remote Fair; Recent Fair  Judgment: Fair  Insight: Fair   Art therapist  Concentration: Fair  Attention Span: Fair  Recall: [employer] of Knowledge: Fair  Language: Fair   Psychomotor Activity  Psychomotor Activity: No data recorded    Assets  Assets: Desire for Improvement; Communication Skills   Sleep  Sleep: No data recorded     Physical Exam: General: Sitting comfortably. NAD. HEENT: Normocephalic, atraumatic, MMM, EMOI Lungs: no increased work of breathing noted Heart: no cyanosis Abdomen: Non distended Musculoskeletal: FROM. No obvious deformities Skin: Warm, dry, intact. No rashes noted Neuro: No obvious focal deficits.  Gait and station are normal  Review of Systems  Constitutional: Negative.   HENT: Negative.    Eyes: Negative.   Respiratory: Negative.    Cardiovascular: Negative.   Gastrointestinal: Negative.   Genitourinary: Negative.   Skin: Negative.   Neurological: Negative.   Psychiatric/Behavioral:  Positive for depression.     Blood pressure 94/67, pulse 88, temperature 97.9 F (36.6 C), temperature source Oral, resp. rate 18, height 4\' 11"  (1.499 m), weight 63 kg, SpO2 100%. Body mass index is 28.05 kg/m.   Treatment Plan Summary: Daily contact with patient to assess and evaluate symptoms and progress in treatment and Medication management.    Still waiting on safe disposition as patient would not be able to live independently given cognitive impairment. Recommend assisted living.  The patient's disposition remains unresolved.  No real progress on discharge plans.  No medication side effects reported.  Because of urinary  incontinence, recommended adult diapers to be worn.  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  Plan:  -Continue Imitrex 25 mg PRN BID for migraines -Continue Colace 100 mg daily for constipation -Continue Vitamin D 50.000 units weekly for bone health. -Continue Sertraline150 mg po Q daily for depression/anxiety -Continue Buspar 15 mg po bid for anxiety.  -Continue paliperidone 6 mg po qd for mood stabilization -Continue Trazodone 50 mg nightly for insomnia  -Continue Nicoderm 21 mg topically Q 24 hrs for nicotine withdrawal management. -Continue vitamin B12 1000 mcg IM weekly for 4 weeks then monthly -Continue Melatonin 5 mg nightly for sleep -Continue Protonix EC 40 mg p.o. daily for GERD -Continue MiraLAX 17 g p.o. PRN for constipation -Continue hydroxyzine 25 mg p.o. 3 times daily as needed for anxiety -Continue Ensure nutritional shakes TID in between meals  -Previously discontinued Hydroxyzine 50 mg nightly-hypotension in the mornings   Safety and Monitoring: Voluntary admission to inpatient psychiatric unit for safety, stabilization and treatment Daily contact with patient to assess and evaluate symptoms and progress in treatment Patient's case to be discussed in multi-disciplinary team meeting Observation Level : q15 minute checks Vital signs: q12 hours Precautions: Safety   Discharge Planning: Social work and case management to assist with discharge planning and identification of hospital follow-up needs prior to discharge Estimated LOS: Unknown at this time. Discharge Concerns: Need to establish a safety plan; Medication compliance and effectiveness Discharge Goals: Return home with outpatient referrals for mental health follow-up  including medication management/psychotherapy Apparently patient now has a legal guardian.  [name], MD 05/30/2023, 12:48 PM Patient ID: Yolanda [name], female   DOB: 14-Apr-1976, 47 y.o.   MRN: [id number]  Patient ID: Yolanda [name], female   DOB: 08-15-1975, 47 y.o.   MRN: [id number]

## 2023-05-30 NOTE — Progress Notes (Signed)
Chaplain met with Yolanda [name] to provide continued support.  She still feels regret about some things that she has done and her brain keeps calling herself "[place]." She feels that she is being punished because she is still here and she is suffering.  Chaplain provided prayer and affirmation that she is a beloved child of God.   She was grateful to have a chance to see her family and especially her grandson.  [address] Bcc Pager, [phone]

## 2023-05-30 NOTE — Progress Notes (Signed)
   05/30/23 0933  Psych Admission Type (Psych Patients Only)  Admission Status Involuntary  Psychosocial Assessment  Patient Complaints Sadness;Depression  Eye Contact Fair  Facial Expression Flat;Sad  Affect Depressed;Sad  Speech Soft;Slow  Interaction Childlike  Motor Activity Slow  Appearance/Hygiene Disheveled  Behavior Characteristics Cooperative;Anxious  Mood Anxious;Depressed  Thought Process  Coherency WDL  Content WDL  Delusions None reported or observed  Perception WDL  Hallucination None reported or observed  Judgment Impaired  Confusion None  Danger to Self  Current suicidal ideation? Denies  Description of Suicide Plan No plan  Self-Injurious Behavior No self-injurious ideation or behavior indicators observed or expressed   Agreement Not to Harm Self Yes  Description of Agreement Verbal  Danger to Others  Danger to Others None reported or observed

## 2023-05-31 DIAGNOSIS — F251 Schizoaffective disorder, depressive type: Secondary | ICD-10-CM | POA: Diagnosis not present

## 2023-05-31 MED ORDER — POLYETHYLENE GLYCOL 3350 17 G PO PACK
17.0000 g | PACK | Freq: Every day | ORAL | Status: DC | PRN
  Administered 2023-06-04 – 2023-06-07 (×2): 17 g via ORAL

## 2023-05-31 MED ORDER — LOPERAMIDE HCL 2 MG PO CAPS
4.0000 mg | ORAL_CAPSULE | ORAL | Status: AC | PRN
  Filled 2023-05-31: qty 2

## 2023-05-31 MED ORDER — ALUM & MAG HYDROXIDE-SIMETH 200-200-20 MG/5ML PO SUSP
30.0000 mL | ORAL | Status: DC | PRN
  Administered 2023-05-31: 30 mL via ORAL
  Filled 2023-05-31: qty 30

## 2023-05-31 NOTE — Progress Notes (Signed)
   05/31/23 2015  Psych Admission Type (Psych Patients Only)  Admission Status Involuntary  Psychosocial Assessment  Patient Complaints Depression;Sadness  Eye Contact Fair  Facial Expression Flat;Sad  Affect Depressed  Speech Soft;Slow  Interaction Childlike;Cautious  Motor Activity Slow  Appearance/Hygiene Disheveled  Behavior Characteristics Cooperative  Mood Sad;Irritable  Aggressive Behavior  Effect No apparent injury  Thought Process  Coherency WDL  Content WDL  Delusions WDL  Perception WDL  Hallucination None reported or observed  Judgment Impaired  Confusion None  Danger to Self  Current suicidal ideation? Denies  Danger to Others  Danger to Others None reported or observed

## 2023-05-31 NOTE — Plan of Care (Signed)
  Problem: Activity: Goal: Interest or engagement in leisure activities will improve Outcome: Progressing   Problem: Coping: Goal: Will verbalize feelings Outcome: Progressing   Problem: Role Relationship: Goal: Will demonstrate positive changes in social behaviors and relationships Outcome: Progressing   Problem: Safety: Goal: Ability to disclose and discuss suicidal ideas will improve Outcome: Progressing   Problem: Coping: Goal: Coping ability will improve Outcome: Not Progressing

## 2023-05-31 NOTE — Progress Notes (Signed)
   05/31/23 0900  Psychosocial Assessment  Patient Complaints Depression;Sadness  Eye Contact Fair  Facial Expression Flat;Sad  Affect Depressed  Speech Soft;Slow  Interaction Childlike  Motor Activity Slow  Appearance/Hygiene Disheveled  Behavior Characteristics Anxious;Cooperative  Mood Anxious;Depressed  Thought Process  Coherency WDL  Content WDL  Delusions None reported or observed  Perception WDL  Hallucination None reported or observed  Judgment Impaired  Confusion None  Danger to Self  Current suicidal ideation? Denies  Self-Injurious Behavior Self-injurious ideation verbalized  Agreement Not to Harm Self Yes  Description of Agreement verbal  Danger to Others  Danger to Others None reported or observed

## 2023-05-31 NOTE — Progress Notes (Signed)
PRN imodium administered due to pt c/o diarrhea on shift.

## 2023-05-31 NOTE — [facility] Group Notes (Signed)
[facility] Group Notes:  (Nursing/MHT/Case Management/Adjunct)  Date:  05/31/2023  Time:  9:17 PM  Type of Therapy:   AA group  Participation Level:  Did Not Attend  Participation Quality:    Affect:    Cognitive:    Insight:    Engagement in Group:    Modes of Intervention:    Summary of Progress/Problems: Pt refused to attend both groups offered. Writer provided her with Designer, multimedia.   Noah Delaine 05/31/2023, 9:17 PM

## 2023-05-31 NOTE — Progress Notes (Signed)
[facility] MD Progress Note  05/31/2023 12:52 PM Yolanda [name]  MRN:  [id number] Subjective: Per nursing report, patient was compliant with medication overnight.  She received no PRNs medications last night.  She slept 8 hours.  Yolanda [name] was seen in her room during rounds.  Today surprisingly she was up and about and was in the day room interacting with people and seemed to be quite pleasant.  She maintained fair to good eye contact.  Although she has some poverty of speech she was able to verbalize and communicate fairly well.  Apparently she has some nausea and diarrhea today.  No changes noted and no further information from her caseworker.   Principal Problem: Schizoaffective disorder, depressive type (HCC) Diagnosis: Principal Problem:   Schizoaffective disorder, depressive type (HCC) Active Problems:   GERD (gastroesophageal reflux disease)   Intellectual disability   Tobacco use disorder  Total Time spent with patient: 20 minutes  Past Psychiatric History: see H&P  Past Medical History:  Past Medical History:  Diagnosis Date   Bipolar affect, depressed (HCC)    Constipation 08/17/2022   Depression    Falls 07/21/2022   Fracture of femoral neck, right, closed (HCC) 01/17/2022   Herpes simplex 08/22/2017   Open fracture dislocation of right elbow joint 01/17/2022    Past Surgical History:  Procedure Laterality Date   NO PAST SURGERIES     SALPINGECTOMY     Family History: History reviewed. No pertinent family history. Family Psychiatric  History: see H&P Social History:  Social History   Substance and Sexual Activity  Alcohol Use Yes     Social History   Substance and Sexual Activity  Drug Use Yes   Types: Cocaine, Marijuana    Social History   Socioeconomic History   Marital status: Single    Spouse name: Not on file   Number of children: Not on file   Years of education: Not on file   Highest education level: Not on file  Occupational History   Not on  file  Tobacco Use   Smoking status: Every Day   Smokeless tobacco: Not on file  Substance and Sexual Activity   Alcohol use: Yes   Drug use: Yes    Types: Cocaine, Marijuana   Sexual activity: Yes  Other Topics Concern   Not on file  Social History Narrative   Not on file   Social Determinants of Health   Financial Resource Strain: Low Risk  (09/12/2022)   Received from [facility], [facility]   Overall Financial Resource Strain (CARDIA)    Difficulty of Paying Living Expenses: Not hard at all  Food Insecurity: Patient Declined (12/20/2022)   Hunger Vital Sign    Worried About Running Out of Food in the Last Year: Patient declined    Ran Out of Food in the Last Year: Patient declined  Transportation Needs: No Transportation Needs (12/20/2022)   PRAPARE - Administrator, Civil Service (Medical): No    Lack of Transportation (Non-Medical): No  Physical Activity: Not on file  Stress: No Stress Concern Present (07/17/2022)   Received from [facility], [facility] of Occupational Health - Occupational Stress Questionnaire    Feeling of Stress : Not at all  Social Connections: Unknown (07/16/2022)   Received from [facility], [facility]   Social Network    Social Network: Not on file   Additional Social History:  Sleep: Good  Appetite:  Poor  Current Medications: Current Facility-Administered Medications  Medication Dose Route Frequency Provider Last Rate Last Admin   acetaminophen (TYLENOL) tablet 650 mg  650 mg Oral Q6H PRN [name], NP   650 mg at 05/31/23 0802   alum & mag hydroxide-simeth (MAALOX/MYLANTA) 200-200-20 MG/5ML suspension 30 mL  30 mL Oral Q4H PRN Onuoha, Chinwendu V, NP   30 mL at 05/31/23 0801   busPIRone (BUSPAR) tablet 15 mg  15 mg Oral BID [name], NP   15 mg at 05/31/23 0757   cyanocobalamin (VITAMIN B12) injection 1,000 mcg  1,000 mcg Intramuscular Q30 days Sarita Bottom, MD   1,000 mcg at 05/03/23 1257   diphenhydrAMINE (BENADRYL) capsule 50 mg  50 mg Oral TID PRN [name], NP   50 mg at 01/15/23 1211   Or   diphenhydrAMINE (BENADRYL) injection 50 mg  50 mg Intramuscular TID PRN [name], NP       docusate sodium (COLACE) capsule 100 mg  100 mg Oral Daily [name], NP   100 mg at 05/31/23 0757   feeding supplement (ENSURE ENLIVE / ENSURE PLUS) liquid 237 mL  237 mL Oral TID BM [name], NP   237 mL at 05/31/23 0942   haloperidol (HALDOL) tablet 5 mg  5 mg Oral TID PRN [name], NP   5 mg at 05/04/23 1914   Or   haloperidol lactate (HALDOL) injection 5 mg  5 mg Intramuscular TID PRN [name], NP       hydrOXYzine (ATARAX) tablet 25 mg  25 mg Oral TID PRN [name], DO   25 mg at 05/27/23 1146   loperamide (IMODIUM) capsule 4 mg  4 mg Oral PRN [name], MD       LORazepam (ATIVAN) tablet 2 mg  2 mg Oral TID PRN [name], NP   2 mg at 01/15/23 1211   Or   LORazepam (ATIVAN) injection 2 mg  2 mg Intramuscular TID PRN [name], NP       magnesium hydroxide (MILK OF MAGNESIA) suspension 30 mL  30 mL Oral Daily PRN [name], NP   30 mL at 04/13/23 0814   melatonin tablet 5 mg  5 mg Oral QHS Nkwenti, Doris, NP   5 mg at 05/30/23 2118   nicotine polacrilex (NICORETTE) gum 2 mg  2 mg Oral PRN [name], MD   2 mg at 05/29/23 1307   paliperidone (INVEGA) 24 hr tablet 6 mg  6 mg Oral Daily [name], NP   6 mg at 05/30/23 2118   pantoprazole (PROTONIX) EC tablet 40 mg  40 mg Oral Daily [name], NP   40 mg at 05/31/23 0757   polyethylene glycol (MIRALAX / GLYCOLAX) packet 17 g  17 g Oral Daily PRN [name], NP   17 g at 04/13/23 1734   sertraline (ZOLOFT) tablet 150 mg  150 mg Oral Daily Massengill, Harrold Donath, MD   150 mg at 05/31/23 0757   SUMAtriptan (IMITREX) tablet 25 mg  25 mg Oral BID PRN [name], NP   25 mg at 05/07/23 2112   traZODone (DESYREL) tablet 50 mg  50 mg Oral  QHS [name], NP   50 mg at 05/30/23 2118   Vitamin D (Ergocalciferol) (DRISDOL) 1.25 MG (50000 UNIT) capsule 50,000 Units  50,000 Units Oral Q7 days [name], NP   50,000 Units at 05/25/23 1744  Lab Results:  No results found for this or any previous visit (from the past 48 hour(s)).   Blood Alcohol level:  Lab Results  Component Value Date   ETH <10 12/19/2022   ETH <10 11/21/2019    Metabolic Disorder Labs: Lab Results  Component Value Date   HGBA1C 4.4 (L) 12/21/2022   MPG 79.58 12/21/2022   No results found for: "PROLACTIN" Lab Results  Component Value Date   CHOL 218 (H) 12/21/2022   TRIG 81 12/21/2022   HDL 53 12/21/2022   CHOLHDL 4.1 12/21/2022   VLDL 16 12/21/2022   LDLCALC 149 (H) 12/21/2022   LDLCALC 110 (H) 03/13/2011    Physical Findings: AIMS: Facial and Oral Movements Muscles of Facial Expression: None Lips and Perioral Area: None Jaw: None Tongue: None,Extremity Movements Upper (arms, wrists, hands, fingers): None Lower (legs, knees, ankles, toes): None, Trunk Movements Neck, shoulders, hips: None, Global Judgements Severity of abnormal movements overall : None Incapacitation due to abnormal movements: None Patient's awareness of abnormal movements: No Awareness, Dental Status Current problems with teeth and/or dentures?: No Does patient usually wear dentures?: No  CIWA:    COWS:     Musculoskeletal: Strength & Muscle Tone: within normal limits Gait & Station: normal Patient leans: N/A  Psychiatric Specialty Exam:  Presentation  General Appearance:  Casual  Eye Contact: Fair  Speech: Clear and Coherent  Speech Volume: Decreased  Handedness: Right   Mood and Affect  Mood: Euthymic  Affect: Restricted   Thought Process  Thought Processes: Linear  Descriptions of Associations:Intact  Orientation:Full (Time, Place and Person)  Thought Content:Logical  History of Schizophrenia/Schizoaffective  disorder:Yes  Duration of Psychotic Symptoms:Greater than six months  Hallucinations:Hallucinations: None    Ideas of Reference:None  Suicidal Thoughts:Suicidal Thoughts: No    Homicidal Thoughts:Homicidal Thoughts: No     Sensorium  Memory: Immediate Fair; Recent Fair; Remote Poor  Judgment: Fair  Insight: Fair   Chartered certified accountant: Fair  Attention Span: Fair  Recall: [employer] of Knowledge: Fair  Language: Fair   Psychomotor Activity  Psychomotor Activity: Psychomotor Activity: Normal     Assets  Assets: Communication Skills; Desire for Improvement   Sleep  Sleep: Sleep: Fair      Physical Exam: General: Sitting comfortably. NAD. HEENT: Normocephalic, atraumatic, MMM, EMOI Lungs: no increased work of breathing noted Heart: no cyanosis Abdomen: Non distended Musculoskeletal: FROM. No obvious deformities Skin: Warm, dry, intact. No rashes noted Neuro: No obvious focal deficits.  Gait and station are normal  Review of Systems  Constitutional: Negative.   HENT: Negative.    Eyes: Negative.   Respiratory: Negative.    Cardiovascular: Negative.   Gastrointestinal: Negative.   Genitourinary: Negative.   Skin: Negative.   Neurological: Negative.   Psychiatric/Behavioral:  Positive for depression.     Blood pressure 94/67, pulse 88, temperature 97.9 F (36.6 C), temperature source Oral, resp. rate 18, height 4\' 11"  (1.499 m), weight 63 kg, SpO2 100%. Body mass index is 28.05 kg/m.   Treatment Plan Summary: Daily contact with patient to assess and evaluate symptoms and progress in treatment and Medication management.    Still waiting on safe disposition as patient would not be able to live independently given cognitive impairment. Recommend assisted living.  The patient's disposition remains unresolved.  No real progress on discharge plans.  No medication side effects reported.  Because of urinary  incontinence, recommended adult diapers to be worn.   Principal/active diagnoses:  Schizoaffective disorder,  depressive type [facility])   Active Problems: GERD (gastroesophageal reflux disease) Intellectual disability Tobacco use disorder (MOCA 16/30) moderate cognitive impairment probably related to moderate intellectual disability.  Plan:  -Continue Imitrex 25 mg PRN BID for migraines -Continue Colace 100 mg daily for constipation -Continue Vitamin D 50.000 units weekly for bone health. -Continue Sertraline150 mg po Q daily for depression/anxiety -Continue Buspar 15 mg po bid for anxiety.  -Continue paliperidone 6 mg po qd for mood stabilization -Continue Trazodone 50 mg nightly for insomnia  -Continue Nicoderm 21 mg topically Q 24 hrs for nicotine withdrawal management. -Continue vitamin B12 1000 mcg IM weekly for 4 weeks then monthly -Continue Melatonin 5 mg nightly for sleep -Continue Protonix EC 40 mg p.o. daily for GERD -Continue MiraLAX 17 g p.o. PRN for constipation, we will hold for a few days. -Continue hydroxyzine 25 mg p.o. 3 times daily as needed for anxiety -Continue Ensure nutritional shakes TID in between meals  -Previously discontinued Hydroxyzine 50 mg nightly-hypotension in the mornings  -Imodium for diarrhea as needed. Safety and Monitoring: Voluntary admission to inpatient psychiatric unit for safety, stabilization and treatment Daily contact with patient to assess and evaluate symptoms and progress in treatment Patient's case to be discussed in multi-disciplinary team meeting Observation Level : q15 minute checks Vital signs: q12 hours Precautions: Safety   Discharge Planning: Social work and case management to assist with discharge planning and identification of hospital follow-up needs prior to discharge Estimated LOS: Unknown at this time. Discharge Concerns: Need to establish a safety plan; Medication compliance and effectiveness Discharge Goals: Return  home with outpatient referrals for mental health follow-up including medication management/psychotherapy Apparently patient now has a legal guardian.  [name], MD 05/31/2023, 12:53 PM Patient ID: Yolanda [name], female   DOB: 1976-04-29, 47 y.o.   MRN: [id number]  Patient ID: Yolanda [name], female   DOB: January 17, 1976, 47 y.o.   MRN: [id number] Patient ID: Yolanda [name], female   DOB: 1976-01-09, 47 y.o.   MRN: [id number]

## 2023-06-01 DIAGNOSIS — F251 Schizoaffective disorder, depressive type: Secondary | ICD-10-CM | POA: Diagnosis not present

## 2023-06-01 NOTE — Progress Notes (Signed)
[facility] MD Progress Note  06/01/2023 9:51 AM Yolanda [name]  MRN:  [id number] Subjective: Per nursing report, patient was compliant with medication overnight.  She received no PRNs medications last night.  She slept 8 hours.  No further complaints of nausea or diarrhea.  She is not attending groups regularly.  Yolanda [name] was seen in her room during rounds.  Today she is a lot more withdrawn.  She is laying in bed under blankets and did answer questions in monosyllables.  She maintained fair eye contact.  Speech is coherent and of low volume without any looseness of associations or flight of ideas.  She did report that her diarrhea is improved and she no longer has nausea.  Awaiting placement. No changes noted and no further information from her caseworker.   Principal Problem: Schizoaffective disorder, depressive type (HCC) Diagnosis: Principal Problem:   Schizoaffective disorder, depressive type (HCC) Active Problems:   GERD (gastroesophageal reflux disease)   Intellectual disability   Tobacco use disorder  Total Time spent with patient: 20 minutes  Past Psychiatric History: see H&P  Past Medical History:  Past Medical History:  Diagnosis Date   Bipolar affect, depressed (HCC)    Constipation 08/17/2022   Depression    Falls 07/21/2022   Fracture of femoral neck, right, closed (HCC) 01/17/2022   Herpes simplex 08/22/2017   Open fracture dislocation of right elbow joint 01/17/2022    Past Surgical History:  Procedure Laterality Date   NO PAST SURGERIES     SALPINGECTOMY     Family History: History reviewed. No pertinent family history. Family Psychiatric  History: see H&P Social History:  Social History   Substance and Sexual Activity  Alcohol Use Yes     Social History   Substance and Sexual Activity  Drug Use Yes   Types: Cocaine, Marijuana    Social History   Socioeconomic History   Marital status: Single    Spouse name: Not on file   Number of children: Not  on file   Years of education: Not on file   Highest education level: Not on file  Occupational History   Not on file  Tobacco Use   Smoking status: Every Day   Smokeless tobacco: Not on file  Substance and Sexual Activity   Alcohol use: Yes   Drug use: Yes    Types: Cocaine, Marijuana   Sexual activity: Yes  Other Topics Concern   Not on file  Social History Narrative   Not on file   Social Determinants of Health   Financial Resource Strain: Low Risk  (09/12/2022)   Received from [facility], [facility]   Overall Financial Resource Strain (CARDIA)    Difficulty of Paying Living Expenses: Not hard at all  Food Insecurity: Patient Declined (12/20/2022)   Hunger Vital Sign    Worried About Running Out of Food in the Last Year: Patient declined    Ran Out of Food in the Last Year: Patient declined  Transportation Needs: No Transportation Needs (12/20/2022)   PRAPARE - Administrator, Civil Service (Medical): No    Lack of Transportation (Non-Medical): No  Physical Activity: Not on file  Stress: No Stress Concern Present (07/17/2022)   Received from [facility], [facility] of Occupational Health - Occupational Stress Questionnaire    Feeling of Stress : Not at all  Social Connections: Unknown (07/16/2022)   Received from [facility], [facility]   Social Network  Social Network: Not on file   Additional Social History:                         Sleep: Good  Appetite:  Poor  Current Medications: Current Facility-Administered Medications  Medication Dose Route Frequency Provider Last Rate Last Admin   acetaminophen (TYLENOL) tablet 650 mg  650 mg Oral Q6H PRN [name], NP   650 mg at 05/31/23 0802   alum & mag hydroxide-simeth (MAALOX/MYLANTA) 200-200-20 MG/5ML suspension 30 mL  30 mL Oral Q4H PRN Onuoha, Chinwendu V, NP   30 mL at 05/31/23 0801   busPIRone (BUSPAR) tablet 15 mg  15 mg Oral BID [name], NP   15  mg at 05/31/23 1618   cyanocobalamin (VITAMIN B12) injection 1,000 mcg  1,000 mcg Intramuscular Q30 days Sarita Bottom, MD   1,000 mcg at 05/03/23 1257   diphenhydrAMINE (BENADRYL) capsule 50 mg  50 mg Oral TID PRN [name], NP   50 mg at 01/15/23 1211   Or   diphenhydrAMINE (BENADRYL) injection 50 mg  50 mg Intramuscular TID PRN [name], NP       docusate sodium (COLACE) capsule 100 mg  100 mg Oral Daily [name], NP   100 mg at 05/31/23 0757   feeding supplement (ENSURE ENLIVE / ENSURE PLUS) liquid 237 mL  237 mL Oral TID BM [name], NP   237 mL at 05/31/23 2130   haloperidol (HALDOL) tablet 5 mg  5 mg Oral TID PRN [name], NP   5 mg at 05/04/23 2952   Or   haloperidol lactate (HALDOL) injection 5 mg  5 mg Intramuscular TID PRN [name], NP       hydrOXYzine (ATARAX) tablet 25 mg  25 mg Oral TID PRN [name], DO   25 mg at 05/27/23 1146   loperamide (IMODIUM) capsule 4 mg  4 mg Oral PRN [name], MD       LORazepam (ATIVAN) tablet 2 mg  2 mg Oral TID PRN [name], NP   2 mg at 01/15/23 1211   Or   LORazepam (ATIVAN) injection 2 mg  2 mg Intramuscular TID PRN [name], NP       magnesium hydroxide (MILK OF MAGNESIA) suspension 30 mL  30 mL Oral Daily PRN [name], NP   30 mL at 04/13/23 0814   melatonin tablet 5 mg  5 mg Oral QHS Nkwenti, Doris, NP   5 mg at 05/31/23 2130   nicotine polacrilex (NICORETTE) gum 2 mg  2 mg Oral PRN [name], MD   2 mg at 05/31/23 1620   paliperidone (INVEGA) 24 hr tablet 6 mg  6 mg Oral Daily [name], NP   6 mg at 05/31/23 2130   pantoprazole (PROTONIX) EC tablet 40 mg  40 mg Oral Daily Nwoko, Nicole Kindred I, NP   40 mg at 05/31/23 0757   [START ON 06/03/2023] polyethylene glycol (MIRALAX / GLYCOLAX) packet 17 g  17 g Oral Daily PRN [name], MD       sertraline (ZOLOFT) tablet 150 mg  150 mg Oral Daily Massengill, Nathan, MD   150 mg at 05/31/23 0757   SUMAtriptan (IMITREX) tablet  25 mg  25 mg Oral BID PRN [name], NP   25 mg at 05/07/23 2112   traZODone (DESYREL) tablet 50 mg  50 mg Oral QHS [name], NP   50 mg at  05/31/23 2130   Vitamin D (Ergocalciferol) (DRISDOL) 1.25 MG (50000 UNIT) capsule 50,000 Units  50,000 Units Oral Q7 days [name], NP   50,000 Units at 05/25/23 1744    Lab Results:  No results found for this or any previous visit (from the past 48 hour(s)).   Blood Alcohol level:  Lab Results  Component Value Date   ETH <10 12/19/2022   ETH <10 11/21/2019    Metabolic Disorder Labs: Lab Results  Component Value Date   HGBA1C 4.4 (L) 12/21/2022   MPG 79.58 12/21/2022   No results found for: "PROLACTIN" Lab Results  Component Value Date   CHOL 218 (H) 12/21/2022   TRIG 81 12/21/2022   HDL 53 12/21/2022   CHOLHDL 4.1 12/21/2022   VLDL 16 12/21/2022   LDLCALC 149 (H) 12/21/2022   LDLCALC 110 (H) 03/13/2011    Physical Findings: AIMS: Facial and Oral Movements Muscles of Facial Expression: None Lips and Perioral Area: None Jaw: None Tongue: None,Extremity Movements Upper (arms, wrists, hands, fingers): None Lower (legs, knees, ankles, toes): None, Trunk Movements Neck, shoulders, hips: None, Global Judgements Severity of abnormal movements overall : None Incapacitation due to abnormal movements: None Patient's awareness of abnormal movements: No Awareness, Dental Status Current problems with teeth and/or dentures?: No Does patient usually wear dentures?: No  CIWA:    COWS:     Musculoskeletal: Strength & Muscle Tone: within normal limits Gait & Station: normal Patient leans: N/A  Psychiatric Specialty Exam:  Presentation  General Appearance:  Casual  Eye Contact: Fair  Speech: Clear and Coherent  Speech Volume: Decreased  Handedness: Right   Mood and Affect  Mood: Euthymic  Affect: Restricted   Thought Process  Thought Processes: Linear  Descriptions of  Associations:Intact  Orientation:Full (Time, Place and Person)  Thought Content:Logical  History of Schizophrenia/Schizoaffective disorder:Yes  Duration of Psychotic Symptoms:Greater than six months  Hallucinations:Hallucinations: None    Ideas of Reference:None  Suicidal Thoughts:Suicidal Thoughts: No    Homicidal Thoughts:Homicidal Thoughts: No     Sensorium  Memory: Immediate Fair; Recent Fair; Remote Poor  Judgment: Fair  Insight: Fair   Chartered certified accountant: Fair  Attention Span: Fair  Recall: [employer] of Knowledge: Fair  Language: Fair   Psychomotor Activity  Psychomotor Activity: Psychomotor Activity: Normal     Assets  Assets: Communication Skills; Desire for Improvement   Sleep  Sleep: Sleep: Fair      Physical Exam: General: Sitting comfortably. NAD. HEENT: Normocephalic, atraumatic, MMM, EMOI Lungs: no increased work of breathing noted Heart: no cyanosis Abdomen: Non distended Musculoskeletal: FROM. No obvious deformities Skin: Warm, dry, intact. No rashes noted Neuro: No obvious focal deficits.  Gait and station are normal  Review of Systems  Constitutional: Negative.   HENT: Negative.    Eyes: Negative.   Respiratory: Negative.    Cardiovascular: Negative.   Gastrointestinal: Negative.   Genitourinary: Negative.   Skin: Negative.   Neurological: Negative.   Psychiatric/Behavioral:  Positive for depression.     Blood pressure 98/73, pulse 76, temperature 97.9 F (36.6 C), temperature source Oral, resp. rate 18, height 4\' 11"  (1.499 m), weight 63 kg, SpO2 100%. Body mass index is 28.05 kg/m.   Treatment Plan Summary: Daily contact with patient to assess and evaluate symptoms and progress in treatment and Medication management.    Still waiting on safe disposition as patient would not be able to live independently given cognitive impairment. Recommend assisted living.  The patient's  disposition remains unresolved.  No real progress on discharge plans.  No medication side effects reported.  Because of urinary incontinence, recommended adult diapers to be worn.   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  Plan:  -Continue Imitrex 25 mg PRN BID for migraines -Continue Colace 100 mg daily for constipation -Continue Vitamin D 50.000 units weekly for bone health. -Continue Sertraline150 mg po Q daily for depression/anxiety -Continue Buspar 15 mg po bid for anxiety.  -Continue paliperidone 6 mg po qd for mood stabilization -Continue Trazodone 50 mg nightly for insomnia  -Continue Nicoderm 21 mg topically Q 24 hrs for nicotine withdrawal management. -Continue vitamin B12 1000 mcg IM weekly for 4 weeks then monthly -Continue Melatonin 5 mg nightly for sleep -Continue Protonix EC 40 mg p.o. daily for GERD -Continue MiraLAX 17 g p.o. PRN for constipation, we will hold for a few days. -Continue hydroxyzine 25 mg p.o. 3 times daily as needed for anxiety -Continue Ensure nutritional shakes TID in between meals  -Previously discontinued Hydroxyzine 50 mg nightly-hypotension in the mornings  -Imodium for diarrhea as needed. Safety and Monitoring: Voluntary admission to inpatient psychiatric unit for safety, stabilization and treatment Daily contact with patient to assess and evaluate symptoms and progress in treatment Patient's case to be discussed in multi-disciplinary team meeting Observation Level : q15 minute checks Vital signs: q12 hours Precautions: Safety   Discharge Planning: Social work and case management to assist with discharge planning and identification of hospital follow-up needs prior to discharge Estimated LOS: Unknown at  this time. Discharge Concerns: Need to establish a safety plan; Medication compliance and effectiveness Discharge Goals: Return home with outpatient referrals for mental health follow-up including medication management/psychotherapy Apparently patient now has a legal guardian.  [name], MD 06/01/2023, 9:51 AM Patient ID: Yolanda [name], female   DOB: 28-Dec-1975, 47 y.o.   MRN: [id number]

## 2023-06-01 NOTE — Group Note (Signed)
[facility] LCSW Group Therapy Note  Date/Time:    06/01/2023 10:00am-11:00am  Type of Therapy and Topic:  Group Therapy:  How To Lillard Anes and Forgive Self  Participation Level:  Did Not Attend   Description of Group:  At the request of several group members, the focus of this group was to examine our tendency to be hyper-critical of self and how we need to show ourselves grace and even forgive ourselves at times.  CSW pointed out that refusing to show ourselves grace or forgive ourselves often leads to feelings of worthlessness, hopelessness, and shame.  Patients were guided to the concept that shame is universal and is worsened by being kept "in the dark," but improved by being "brought into the light."  A variety of shaming experiences were brought up and the group was able to identify the commonality of these experiences.  Research about shame being connected to 12 different areas of "not enough" for both men and women was mentioned and patients were encouraged to start change their internal dialog to "I am enough."  A song entitled the same was played and greatly appreciated by group members.  Therapeutic Goals Identify ways in which we tend to extend grace and forgiveness to others, but refuse to do so to ourselves.   Examine different areas of life in which humans in general tend to feel they are "not enough." Talk about the frequency with which we hide our shame and how that enlarges it. Allow patients to discuss their shame out loud in order to reduce its power. Practice the affirmation "I Am Enough" through song.  Summary of Patient Progress: Patient was invited to group, did not attend.   Therapeutic Modalities Processing Psychoeducation  Ambrose Mantle, LCSW 06/01/2023, 2:42 PM

## 2023-06-01 NOTE — [facility] Group Notes (Signed)
Pt did not attend goals group.

## 2023-06-01 NOTE — Progress Notes (Signed)
   06/01/23 2200  Psych Admission Type (Psych Patients Only)  Admission Status Involuntary  Psychosocial Assessment  Patient Complaints Anxiety;Crying spells;Depression  Eye Contact Fair  Facial Expression Worried;Anxious  Affect Appropriate to circumstance  Speech Logical/coherent  Interaction Childlike;Minimal  Motor Activity Slow;Tremors  Appearance/Hygiene Improved  Behavior Characteristics Cooperative  Mood Sad  Thought Process  Coherency WDL  Content Blaming others  Delusions None reported or observed  Perception WDL  Hallucination None reported or observed  Judgment Poor  Confusion None  Danger to Self  Current suicidal ideation? Denies  Self-Injurious Behavior No self-injurious ideation or behavior indicators observed or expressed   Agreement Not to Harm Self Yes  Description of Agreement verbal  Danger to Others  Danger to Others None reported or observed

## 2023-06-01 NOTE — Plan of Care (Signed)
Problem: Safety: Goal: Ability to disclose and discuss suicidal ideas will improve Outcome: Progressing   Problem: Activity: Goal: Interest or engagement in leisure activities will improve Outcome: Not Progressing

## 2023-06-01 NOTE — Group Note (Signed)
Date:  06/01/2023 Time:  9:36 PM  Group Topic/Focus:  Wrap-Up Group:   The focus of this group is to help patients review their daily goal of treatment and discuss progress on daily workbooks.    Participation Level:  Did Not Attend  Participation Quality:   N/A  Affect:   N/A  Cognitive:   N/A  Insight: None  Engagement in Group:   N/A  Modes of Intervention:   N/A  Additional Comments:  Patient did not attend wrap up group.   Kennieth Francois 06/01/2023, 9:36 PM

## 2023-06-02 DIAGNOSIS — F251 Schizoaffective disorder, depressive type: Secondary | ICD-10-CM

## 2023-06-02 NOTE — [facility] Group Notes (Signed)
Wrap-Up Group: This group's main objectives are to assist patients in reviewing their daily treatment goals and talking about their progress on their daily workbooks. "What are you avoiding, facing?" was today's question. Have you determined that it's not time or that you're not ready to work on something that you know you need to?  Pt response " I been avoiding the fact I cut, I cut myself, I've been cutting and I know I need to stop."

## 2023-06-02 NOTE — Plan of Care (Signed)
Problem: Coping: Goal: Coping ability will improve Outcome: Progressing   Problem: Safety: Goal: Ability to disclose and discuss suicidal ideas will improve Outcome: Progressing

## 2023-06-02 NOTE — Plan of Care (Signed)
  Problem: Education: Goal: Utilization of techniques to improve thought processes will improve Outcome: Progressing   Problem: Activity: Goal: Interest or engagement in leisure activities will improve Outcome: Progressing   Problem: Coping: Goal: Coping ability will improve Outcome: Progressing   Problem: Health Behavior/Discharge Planning: Goal: Ability to make decisions will improve Outcome: Progressing   Problem: Safety: Goal: Ability to disclose and discuss suicidal ideas will improve Outcome: Progressing

## 2023-06-02 NOTE — Progress Notes (Signed)
[facility] MD Progress Note  06/02/2023 10:19 AM Anwar MARKESHA MITTLEMAN  MRN:  [id number] Subjective: Per nursing report, patient was compliant with medication overnight.  She received no PRNs medications last night.  She slept 7.5 hours.  No change since yesterday.  She is apparently attending groups but not on a regular basis. Keriana was seen in her room during rounds.  She is unchanged since yesterday.  Her mental status is essentially unchanged.  No specific complaints today.  Awaiting placement. No changes noted and no further information from her caseworker.   Principal Problem: Schizoaffective disorder, depressive type (HCC) Diagnosis: Principal Problem:   Schizoaffective disorder, depressive type (HCC) Active Problems:   GERD (gastroesophageal reflux disease)   Intellectual disability   Tobacco use disorder  Total Time spent with patient: 20 minutes  Past Psychiatric History: see H&P  Past Medical History:  Past Medical History:  Diagnosis Date   Bipolar affect, depressed (HCC)    Constipation 08/17/2022   Depression    Falls 07/21/2022   Fracture of femoral neck, right, closed (HCC) 01/17/2022   Herpes simplex 08/22/2017   Open fracture dislocation of right elbow joint 01/17/2022    Past Surgical History:  Procedure Laterality Date   NO PAST SURGERIES     SALPINGECTOMY     Family History: History reviewed. No pertinent family history. Family Psychiatric  History: see H&P Social History:  Social History   Substance and Sexual Activity  Alcohol Use Yes     Social History   Substance and Sexual Activity  Drug Use Yes   Types: Cocaine, Marijuana    Social History   Socioeconomic History   Marital status: Single    Spouse name: Not on file   Number of children: Not on file   Years of education: Not on file   Highest education level: Not on file  Occupational History   Not on file  Tobacco Use   Smoking status: Every Day   Smokeless tobacco: Not on file   Substance and Sexual Activity   Alcohol use: Yes   Drug use: Yes    Types: Cocaine, Marijuana   Sexual activity: Yes  Other Topics Concern   Not on file  Social History Narrative   Not on file   Social Determinants of Health   Financial Resource Strain: Low Risk  (09/12/2022)   Received from [facility]   Overall Financial Resource Strain (CARDIA)    Difficulty of Paying Living Expenses: Not hard at all  Food Insecurity: Patient Declined (12/20/2022)   Hunger Vital Sign    Worried About Running Out of Food in the Last Year: Patient declined    Ran Out of Food in the Last Year: Patient declined  Transportation Needs: No Transportation Needs (12/20/2022)   PRAPARE - Administrator, Civil Service (Medical): No    Lack of Transportation (Non-Medical): No  Physical Activity: Not on file  Stress: No Stress Concern Present (07/17/2022)   Received from [facility] of Occupational Health - Occupational Stress Questionnaire    Feeling of Stress : Not at all  Social Connections: Unknown (07/16/2022)   Received from [facility]   Social Network    Social Network: Not on file   Additional Social History:                         Sleep: Good  Appetite:  Poor  Current Medications: Current Facility-Administered Medications  Medication Dose Route Frequency Provider Last Rate Last Admin   acetaminophen (TYLENOL) tablet 650 mg  650 mg Oral Q6H PRN [name], NP   650 mg at 05/31/23 0802   alum & mag hydroxide-simeth (MAALOX/MYLANTA) 200-200-20 MG/5ML suspension 30 mL  30 mL Oral Q4H PRN Onuoha, Chinwendu V, NP   30 mL at 05/31/23 0801   busPIRone (BUSPAR) tablet 15 mg  15 mg Oral BID [name], NP   15 mg at 06/01/23 1811   cyanocobalamin (VITAMIN B12) injection 1,000 mcg  1,000 mcg Intramuscular Q30 days Sarita Bottom, MD   1,000 mcg at 05/03/23 1257   diphenhydrAMINE (BENADRYL) capsule 50 mg   50 mg Oral TID PRN [name], NP   50 mg at 01/15/23 1211   Or   diphenhydrAMINE (BENADRYL) injection 50 mg  50 mg Intramuscular TID PRN [name], NP       docusate sodium (COLACE) capsule 100 mg  100 mg Oral Daily [name], NP   100 mg at 06/01/23 1010   feeding supplement (ENSURE ENLIVE / ENSURE PLUS) liquid 237 mL  237 mL Oral TID BM [name], NP   237 mL at 06/01/23 2120   haloperidol (HALDOL) tablet 5 mg  5 mg Oral TID PRN [name], NP   5 mg at 05/04/23 2025   Or   haloperidol lactate (HALDOL) injection 5 mg  5 mg Intramuscular TID PRN [name], NP       hydrOXYzine (ATARAX) tablet 25 mg  25 mg Oral TID PRN [name], DO   25 mg at 05/27/23 1146   loperamide (IMODIUM) capsule 4 mg  4 mg Oral PRN [name], MD       LORazepam (ATIVAN) tablet 2 mg  2 mg Oral TID PRN [name], NP   2 mg at 01/15/23 1211   Or   LORazepam (ATIVAN) injection 2 mg  2 mg Intramuscular TID PRN [name], NP       magnesium hydroxide (MILK OF MAGNESIA) suspension 30 mL  30 mL Oral Daily PRN [name], NP   30 mL at 04/13/23 0814   melatonin tablet 5 mg  5 mg Oral QHS Nkwenti, Doris, NP   5 mg at 06/01/23 2118   nicotine polacrilex (NICORETTE) gum 2 mg  2 mg Oral PRN [name], MD   2 mg at 06/01/23 1647   paliperidone (INVEGA) 24 hr tablet 6 mg  6 mg Oral Daily [name], NP   6 mg at 06/01/23 2118   pantoprazole (PROTONIX) EC tablet 40 mg  40 mg Oral Daily Nwoko, Nicole Kindred I, NP   40 mg at 06/01/23 1010   [START ON 06/03/2023] polyethylene glycol (MIRALAX / GLYCOLAX) packet 17 g  17 g Oral Daily PRN [name], MD       sertraline (ZOLOFT) tablet 150 mg  150 mg Oral Daily Massengill, Nathan, MD   150 mg at 06/01/23 1010   SUMAtriptan (IMITREX) tablet 25 mg  25 mg Oral BID PRN [name], NP   25 mg at 05/07/23 2112   traZODone (DESYREL) tablet 50 mg  50 mg Oral QHS [name], NP   50 mg at 06/01/23 2118   Vitamin D (Ergocalciferol)  (DRISDOL) 1.25 MG (50000 UNIT) capsule 50,000 Units  50,000 Units Oral Q7 days [name], NP   50,000 Units at 06/01/23 1404    Lab Results:  No results found for  this or any previous visit (from the past 48 hour(s)).   Blood Alcohol level:  Lab Results  Component Value Date   ETH <10 12/19/2022   ETH <10 11/21/2019    Metabolic Disorder Labs: Lab Results  Component Value Date   HGBA1C 4.4 (L) 12/21/2022   MPG 79.58 12/21/2022   No results found for: "PROLACTIN" Lab Results  Component Value Date   CHOL 218 (H) 12/21/2022   TRIG 81 12/21/2022   HDL 53 12/21/2022   CHOLHDL 4.1 12/21/2022   VLDL 16 12/21/2022   LDLCALC 149 (H) 12/21/2022   LDLCALC 110 (H) 03/13/2011    Physical Findings: AIMS: Facial and Oral Movements Muscles of Facial Expression: None Lips and Perioral Area: None Jaw: None Tongue: None,Extremity Movements Upper (arms, wrists, hands, fingers): None Lower (legs, knees, ankles, toes): None, Trunk Movements Neck, shoulders, hips: None, Global Judgements Severity of abnormal movements overall : None Incapacitation due to abnormal movements: None Patient's awareness of abnormal movements: No Awareness, Dental Status Current problems with teeth and/or dentures?: No Does patient usually wear dentures?: No  CIWA:    COWS:     Musculoskeletal: Strength & Muscle Tone: within normal limits Gait & Station: normal Patient leans: N/A  Psychiatric Specialty Exam:  Presentation  General Appearance:  Casual  Eye Contact: Fair  Speech: Clear and Coherent  Speech Volume: Decreased  Handedness: Right   Mood and Affect  Mood: Euthymic  Affect: Restricted   Thought Process  Thought Processes: Linear  Descriptions of Associations:Intact  Orientation:Full (Time, Place and Person)  Thought Content:Logical  History of Schizophrenia/Schizoaffective disorder:Yes  Duration of Psychotic Symptoms:Greater than six  months  Hallucinations:No data recorded    Ideas of Reference:None  Suicidal Thoughts:No data recorded    Homicidal Thoughts:No data recorded     Sensorium  Memory: Immediate Fair; Recent Fair; Remote Poor  Judgment: Fair  Insight: Fair   Chartered certified accountant: Fair  Attention Span: Fair  Recall: [employer] of Knowledge: Fair  Language: Fair   Psychomotor Activity  Psychomotor Activity: No data recorded     Assets  Assets: Communication Skills; Desire for Improvement   Sleep  Sleep: No data recorded      Physical Exam: General: Sitting comfortably. NAD. HEENT: Normocephalic, atraumatic, MMM, EMOI Lungs: no increased work of breathing noted Heart: no cyanosis Abdomen: Non distended Musculoskeletal: FROM. No obvious deformities Skin: Warm, dry, intact. No rashes noted Neuro: No obvious focal deficits.  Gait and station are normal  Review of Systems  Constitutional: Negative.   HENT: Negative.    Eyes: Negative.   Respiratory: Negative.    Cardiovascular: Negative.   Gastrointestinal: Negative.   Genitourinary: Negative.   Skin: Negative.   Neurological: Negative.   Psychiatric/Behavioral:  Positive for depression.     Blood pressure 104/62, pulse 76, temperature 97.9 F (36.6 C), temperature source Oral, resp. rate 18, height 4\' 11"  (1.499 m), weight 63 kg, SpO2 100%. Body mass index is 28.05 kg/m.   Treatment Plan Summary: Daily contact with patient to assess and evaluate symptoms and progress in treatment and Medication management.    Still waiting on safe disposition as patient would not be able to live independently given cognitive impairment. Recommend assisted living.  The patient's disposition remains unresolved.  No real progress on discharge plans.  No medication side effects reported.  Because of urinary incontinence, recommended adult diapers to be worn.   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  Plan:  -Continue Imitrex 25 mg PRN BID for migraines -Continue Colace 100 mg daily for constipation -Continue Vitamin D 50.000 units weekly for bone health. -Continue Sertraline150 mg po Q daily for depression/anxiety -Continue Buspar 15 mg po bid for anxiety.  -Continue paliperidone 6 mg po qd for mood stabilization -Continue Trazodone 50 mg nightly for insomnia  -Continue Nicoderm 21 mg topically Q 24 hrs for nicotine withdrawal management. -Continue vitamin B12 1000 mcg IM weekly for 4 weeks then monthly -Continue Melatonin 5 mg nightly for sleep -Continue Protonix EC 40 mg p.o. daily for GERD -Continue MiraLAX 17 g p.o. PRN for constipation, we will hold for a few days. -Continue hydroxyzine 25 mg p.o. 3 times daily as needed for anxiety -Continue Ensure nutritional shakes TID in between meals  -Previously discontinued Hydroxyzine 50 mg nightly-hypotension in the mornings  -Imodium for diarrhea as needed. Safety and Monitoring: Voluntary admission to inpatient psychiatric unit for safety, stabilization and treatment Daily contact with patient to assess and evaluate symptoms and progress in treatment Patient's case to be discussed in multi-disciplinary team meeting Observation Level : q15 minute checks Vital signs: q12 hours Precautions: Safety   Discharge Planning: Social work and case management to assist with discharge planning and identification of hospital follow-up needs prior to discharge Estimated LOS: Unknown at this time. Discharge Concerns: Need to establish a safety plan; Medication compliance and effectiveness Discharge Goals: Return home with outpatient referrals for mental health follow-up including medication  management/psychotherapy Apparently patient now has a legal guardian.  [name], MD 06/02/2023, 10:19 AM Patient ID: Larey Brick, female   DOB: 16-Oct-1975, 47 y.o.   MRN: [id number] Patient ID: FELICITY BACCHI, female   DOB: 09-17-1975, 47 y.o.   MRN: [id number]

## 2023-06-02 NOTE — Progress Notes (Signed)
   06/02/23 0800  Psych Admission Type (Psych Patients Only)  Admission Status Involuntary  Psychosocial Assessment  Patient Complaints Anxiety;Depression;Crying spells  Eye Contact Fair  Facial Expression Anxious;Worried;Sad  Affect Appropriate to circumstance  Speech Logical/coherent  Interaction Childlike;Minimal  Motor Activity Slow;Tremors  Appearance/Hygiene Improved  Behavior Characteristics Cooperative  Mood Sad  Thought Process  Coherency WDL  Content Blaming others  Delusions None reported or observed  Perception WDL  Hallucination None reported or observed  Judgment Poor  Confusion None  Danger to Self  Current suicidal ideation? Denies  Agreement Not to Harm Self Yes  Description of Agreement Verbal  Danger to Others  Danger to Others None reported or observed

## 2023-06-03 ENCOUNTER — Encounter (HOSPITAL_COMMUNITY): Payer: Self-pay

## 2023-06-03 DIAGNOSIS — F251 Schizoaffective disorder, depressive type: Secondary | ICD-10-CM | POA: Diagnosis not present

## 2023-06-03 NOTE — Group Note (Signed)
Recreation Therapy Group Note   Group Topic:Health and Wellness  Group Date: 06/03/2023 Start Time: 0930 End Time: 1005 Facilitators: Igor Bishop-McCall, LRT,CTRS Location: [address] Dayroom   Group Topic: Exercise/Wellness  Goal Area(s) Addresses:  Patient will verbalize benefit of exercise during group session. Patient will identify an exercise that can be completed post d/c. Patient will acknowledge benefits of exercise when used as a coping mechanism.   Group Description: Patients and LRT discussed the importance of physical exercise and its benefits. During group, patients took turns leading the group in the exercises/stretches of their choosing. Patients completed three rounds of exercise. Patients could get water or take a break if needed.  Education: Physical Activity, Health and Wellness  Education Outcome: Acknowledges understanding/In group clarification offered/Needs additional education.    Affect/Mood: N/A   Participation Level: Did not attend    Clinical Observations/Individualized Feedback:     Plan: Continue to engage patient in RT group sessions 2-3x/week.   Munirah Doerner-McCall, LRT,CTRS  06/03/2023 11:27 AM

## 2023-06-03 NOTE — Plan of Care (Signed)
Problem: Education: Goal: Utilization of techniques to improve thought processes will improve Outcome: Progressing   Problem: Activity: Goal: Interest or engagement in leisure activities will improve Outcome: Progressing   Problem: Coping: Goal: Coping ability will improve Outcome: Progressing   Problem: Safety: Goal: Ability to disclose and discuss suicidal ideas will improve Outcome: Progressing

## 2023-06-03 NOTE — Plan of Care (Signed)

## 2023-06-03 NOTE — Progress Notes (Signed)
   06/02/23 2308  Psych Admission Type (Psych Patients Only)  Admission Status Involuntary  Psychosocial Assessment  Patient Complaints Anxiety  Eye Contact Fair  Facial Expression Worried;Anxious  Affect Appropriate to circumstance  Speech Logical/coherent  Interaction Childlike;Minimal  Motor Activity Slow;Tremors  Appearance/Hygiene Improved  Behavior Characteristics Cooperative;Appropriate to situation  Mood Anxious  Thought Process  Coherency WDL  Content Blaming others  Delusions None reported or observed  Perception WDL  Hallucination None reported or observed  Judgment Poor  Confusion None  Danger to Self  Current suicidal ideation? Denies  Self-Injurious Behavior No self-injurious ideation or behavior indicators observed or expressed   Agreement Not to Harm Self Yes  Description of Agreement verbal  Danger to Others  Danger to Others None reported or observed

## 2023-06-03 NOTE — Progress Notes (Addendum)
Patient ID: Yolanda [name], female   DOB: Jan 18, 1976, 47 y.o.   MRN: [id number] [name] approached writer very tearful, sobbing and with childlike behaviors. She reports '' I am just having voices and feeling terrible. I hate being schizophrenic. '' She appears visibly upset. Discussed with patient and allowed her to ventilate, emotional support provided. She continues to report feeling internally agitated '' stuck in my head. '' And she is requesting prn medication for the above despite a lengthy conversation allowing her to voice frustrations.  Prn agitation protocol was given for the above. Pt has remained intermittently tearful and voices her frustrations at prospective disposition to facility in [place] as she is afraid to leave the [place] area near her family.   Pt is safe, will con't to monitor for medication effectiveness. 1200 pt reports improvement with prn medications.

## 2023-06-03 NOTE — [facility] Group Notes (Signed)
Spiritual care group on grief and loss facilitated by Chaplain Katy Climmie Buelow, [facility]  Group Goal: Support / Education around grief and loss  Members engage in facilitated group support and psycho-social education.  Group Description:  Following introductions and group rules, group members engaged in facilitated group dialogue and support around topic of loss, with particular support around experiences of loss in their lives. Group Identified types of loss (relationships / self / things) and identified patterns, circumstances, and changes that precipitate losses. Reflected on thoughts / feelings around loss, normalized grief responses, and recognized variety in grief experience. Group encouraged individual reflection on safe space and on the coping skills that they are already utilizing.  Group drew on Adlerian / Rogerian and narrative framework  Patient Progress: Did not attend.  

## 2023-06-03 NOTE — BH IP Treatment Plan (Signed)
Interdisciplinary Treatment and Diagnostic Plan Update  06/03/2023 Time of Session: 11:40AM - UPDATE LEKITA DIBBLE MRN: [id number]  Principal Diagnosis: Schizoaffective disorder, depressive type (HCC)  Secondary Diagnoses: Principal Problem:   Schizoaffective disorder, depressive type (HCC) Active Problems:   GERD (gastroesophageal reflux disease)   Intellectual disability   Tobacco use disorder   Current Medications:  Current Facility-Administered Medications  Medication Dose Route Frequency Provider Last Rate Last Admin   acetaminophen (TYLENOL) tablet 650 mg  650 mg Oral Q6H PRN [name], NP   650 mg at 06/02/23 2137   alum & mag hydroxide-simeth (MAALOX/MYLANTA) 200-200-20 MG/5ML suspension 30 mL  30 mL Oral Q4H PRN Onuoha, Chinwendu V, NP   30 mL at 05/31/23 0801   busPIRone (BUSPAR) tablet 15 mg  15 mg Oral BID [name], NP   15 mg at 06/03/23 0735   cyanocobalamin (VITAMIN B12) injection 1,000 mcg  1,000 mcg Intramuscular Q30 days Sarita Bottom, MD   1,000 mcg at 06/02/23 1129   diphenhydrAMINE (BENADRYL) capsule 50 mg  50 mg Oral TID PRN [name], NP   50 mg at 06/03/23 1110   Or   diphenhydrAMINE (BENADRYL) injection 50 mg  50 mg Intramuscular TID PRN [name], NP       docusate sodium (COLACE) capsule 100 mg  100 mg Oral Daily [name], NP   100 mg at 06/03/23 0735   feeding supplement (ENSURE ENLIVE / ENSURE PLUS) liquid 237 mL  237 mL Oral TID BM [name], NP   237 mL at 06/03/23 0940   haloperidol (HALDOL) tablet 5 mg  5 mg Oral TID PRN [name], NP   5 mg at 06/03/23 1110   Or   haloperidol lactate (HALDOL) injection 5 mg  5 mg Intramuscular TID PRN [name], NP       hydrOXYzine (ATARAX) tablet 25 mg  25 mg Oral TID PRN [name], DO   25 mg at 05/27/23 1146   LORazepam (ATIVAN) tablet 2 mg  2 mg Oral TID PRN [name], NP   2 mg at 06/03/23 1110   Or   LORazepam (ATIVAN) injection 2 mg  2 mg Intramuscular TID  PRN [name], NP       magnesium hydroxide (MILK OF MAGNESIA) suspension 30 mL  30 mL Oral Daily PRN [name], NP   30 mL at 04/13/23 0814   melatonin tablet 5 mg  5 mg Oral QHS Nkwenti, Doris, NP   5 mg at 06/02/23 2137   nicotine polacrilex (NICORETTE) gum 2 mg  2 mg Oral PRN [name], MD   2 mg at 06/03/23 1110   paliperidone (INVEGA) 24 hr tablet 6 mg  6 mg Oral Daily [name], NP   6 mg at 06/02/23 2137   pantoprazole (PROTONIX) EC tablet 40 mg  40 mg Oral Daily Nwoko, Nicole Kindred I, NP   40 mg at 06/03/23 0735   polyethylene glycol (MIRALAX / GLYCOLAX) packet 17 g  17 g Oral Daily PRN [name], MD       sertraline (ZOLOFT) tablet 150 mg  150 mg Oral Daily Massengill, Nathan, MD   150 mg at 06/03/23 0735   SUMAtriptan (IMITREX) tablet 25 mg  25 mg Oral BID PRN [name], NP   25 mg at 05/07/23 2112   traZODone (DESYREL) tablet 50 mg  50 mg Oral QHS [name], NP   50 mg at 06/02/23 2137   Vitamin  D (Ergocalciferol) (DRISDOL) 1.25 MG (50000 UNIT) capsule 50,000 Units  50,000 Units Oral Q7 days [name], NP   50,000 Units at 06/01/23 1404   PTA Medications: Medications Prior to Admission  Medication Sig Dispense Refill Last Dose   busPIRone (BUSPAR) 15 MG tablet Take 15 mg by mouth 2 (two) times daily. (Patient not taking: Reported on 12/19/2022)      paliperidone (INVEGA SUSTENNA) 156 MG/ML SUSY injection Inject 156 mg into the muscle once. (Patient not taking: Reported on 12/19/2022)      sertraline (ZOLOFT) 50 MG tablet Take 150 mg by mouth daily. (Patient not taking: Reported on 12/19/2022)      traZODone (DESYREL) 100 MG tablet Take 100 mg by mouth at bedtime as needed for sleep. (Patient not taking: Reported on 11/12/2022)       Patient Stressors: Medication change or noncompliance    Patient Strengths: Forensic psychologist fund of knowledge   Treatment Modalities: Medication Management, Group therapy, Case management,  1 to 1 session  with clinician, Psychoeducation, Recreational therapy.   Physician Treatment Plan for Primary Diagnosis: Schizoaffective disorder, depressive type (HCC) Long Term Goal(s): Improvement in symptoms so as ready for discharge   Short Term Goals: Ability to identify and develop effective coping behaviors will improve Ability to maintain clinical measurements within normal limits will improve Compliance with prescribed medications will improve Ability to identify triggers associated with substance abuse/mental health issues will improve Ability to identify changes in lifestyle to reduce recurrence of condition will improve Ability to verbalize feelings will improve Ability to disclose and discuss suicidal ideas Ability to demonstrate self-control will improve  Medication Management: Evaluate patient's response, side effects, and tolerance of medication regimen.  Therapeutic Interventions: 1 to 1 sessions, Unit Group sessions and Medication administration.  Evaluation of Outcomes: Progressing  Physician Treatment Plan for Secondary Diagnosis: Principal Problem:   Schizoaffective disorder, depressive type (HCC) Active Problems:   GERD (gastroesophageal reflux disease)   Intellectual disability   Tobacco use disorder  Long Term Goal(s): Improvement in symptoms so as ready for discharge   Short Term Goals: Ability to identify and develop effective coping behaviors will improve Ability to maintain clinical measurements within normal limits will improve Compliance with prescribed medications will improve Ability to identify triggers associated with substance abuse/mental health issues will improve Ability to identify changes in lifestyle to reduce recurrence of condition will improve Ability to verbalize feelings will improve Ability to disclose and discuss suicidal ideas Ability to demonstrate self-control will improve     Medication Management: Evaluate patient's response, side effects,  and tolerance of medication regimen.  Therapeutic Interventions: 1 to 1 sessions, Unit Group sessions and Medication administration.  Evaluation of Outcomes: Progressing   RN Treatment Plan for Primary Diagnosis: Schizoaffective disorder, depressive type (HCC) Long Term Goal(s): Knowledge of disease and therapeutic regimen to maintain health will improve  Short Term Goals: Ability to remain free from injury will improve, Ability to participate in decision making will improve, and Compliance with prescribed medications will improve  Medication Management: RN will administer medications as ordered by provider, will assess and evaluate patient's response and provide education to patient for prescribed medication. RN will report any adverse and/or side effects to prescribing provider.  Therapeutic Interventions: 1 on 1 counseling sessions, Psychoeducation, Medication administration, Evaluate responses to treatment, Monitor vital signs and CBGs as ordered, Perform/monitor CIWA, COWS, AIMS and Fall Risk screenings as ordered, Perform wound care treatments as ordered.  Evaluation of Outcomes: Progressing  LCSW Treatment Plan for Primary Diagnosis: Schizoaffective disorder, depressive type (HCC) Long Term Goal(s): Safe transition to appropriate next level of care at discharge, Engage patient in therapeutic group addressing interpersonal concerns.  Short Term Goals: Engage patient in aftercare planning with referrals and resources, Increase social support, Increase emotional regulation, and Facilitate patient progression through stages of change regarding substance use diagnoses and concerns  Therapeutic Interventions: Assess for all discharge needs, 1 to 1 time with Social worker, Explore available resources and support systems, Assess for adequacy in community support network, Educate family and significant other(s) on suicide prevention, Complete Psychosocial Assessment, Interpersonal group  therapy.  Evaluation of Outcomes: Progressing   Progress in Treatment: Attending groups: No. Participating in groups: No. Taking medication as prescribed: Yes. Toleration medication: Yes. Family/Significant other contact made: Yes, individual(s) contacted:  DSS ( [place] guardian )  Patient understands diagnosis: Yes. Discussing patient identified problems/goals with staff: Yes. Medical problems stabilized or resolved: Yes. Denies suicidal/homicidal ideation: Yes. Issues/concerns per patient self-inventory: No.   New problem(s) identified: No, Describe:  None reported    New Short Term/Long Term Goal(s):   medication stabilization, elimination of SI thoughts, development of comprehensive mental wellness plan.     Patient Goals:  " placement "    Discharge Plan or Barriers: Patient recently admitted. CSW will continue to follow and assess for appropriate referrals and possible discharge planning.  Pt is awaiting to be placed at [facility] in [place]    Reason for Continuation of Hospitalization: Anxiety Depression Medication stabilization Other; describe Placement    Estimated Length of Stay: waiting on her Medicaid B to be converted to Med C  Last 3 [place] Suicide Severity Risk Score: Flowsheet Row Admission (Current) [facility]19[facility]2022 [facility] ED from 11/12/2022 in [facility] Department at [facility]  C-SSRS RISK CATEGORY High Risk High Risk Moderate Risk       Last [facility] 2/9 Scores:     No data to display          Scribe for Treatment Team: Kathi Der, LCSWA 06/03/2023 2:03 PM

## 2023-06-03 NOTE — [facility] Group Notes (Signed)
[facility] Group Notes:  (Nursing/MHT/Case Management/Adjunct)  Date:  06/03/2023  Time:  2000  Type of Therapy:   AA Group  Participation Level:  Did Not Attend  Participation Quality:   N/A  Affect:   N/A  Cognitive:   N/A  Insight:  None  Engagement in Group:   N/A  Modes of Intervention:   N/A  Summary of Progress/Problems: Pt. Were aware of group. Pt. Refused.  Fay Records 06/03/2023, 11:03 PM

## 2023-06-03 NOTE — Progress Notes (Signed)
[facility] MD Progress Note  06/03/2023 9:32 AM Yolanda [name]  MRN:  [id number] Subjective: Per nursing report, patient was compliant with treatment.  She did not receive any as needed medications and she slept well for about 7 hours.  No side effects noted and patient continues to be unchanged.  She is still awaiting placement.  According to the caseworker the may be closer.  Potential placement.  The facility has been identified but her insurance status is being verified.  Hopefully we plan about discharge to a facility within the week.   Principal Problem: Schizoaffective disorder, depressive type (HCC) Diagnosis: Principal Problem:   Schizoaffective disorder, depressive type (HCC) Active Problems:   GERD (gastroesophageal reflux disease)   Intellectual disability   Tobacco use disorder  Total Time spent with patient: 20 minutes  Past Psychiatric History: see H&P  Past Medical History:  Past Medical History:  Diagnosis Date   Bipolar affect, depressed (HCC)    Constipation 08/17/2022   Depression    Falls 07/21/2022   Fracture of femoral neck, right, closed (HCC) 01/17/2022   Herpes simplex 08/22/2017   Open fracture dislocation of right elbow joint 01/17/2022    Past Surgical History:  Procedure Laterality Date   NO PAST SURGERIES     SALPINGECTOMY     Family History: History reviewed. No pertinent family history. Family Psychiatric  History: see H&P Social History:  Social History   Substance and Sexual Activity  Alcohol Use Yes     Social History   Substance and Sexual Activity  Drug Use Yes   Types: Cocaine, Marijuana    Social History   Socioeconomic History   Marital status: Single    Spouse name: Not on file   Number of children: Not on file   Years of education: Not on file   Highest education level: Not on file  Occupational History   Not on file  Tobacco Use   Smoking status: Every Day   Smokeless tobacco: Not on file  Substance and Sexual  Activity   Alcohol use: Yes   Drug use: Yes    Types: Cocaine, Marijuana   Sexual activity: Yes  Other Topics Concern   Not on file  Social History Narrative   Not on file   Social Determinants of Health   Financial Resource Strain: Low Risk  (09/12/2022)   Received from [facility]   Overall Financial Resource Strain (CARDIA)    Difficulty of Paying Living Expenses: Not hard at all  Food Insecurity: Patient Declined (12/20/2022)   Hunger Vital Sign    Worried About Running Out of Food in the Last Year: Patient declined    Ran Out of Food in the Last Year: Patient declined  Transportation Needs: No Transportation Needs (12/20/2022)   PRAPARE - Administrator, Civil Service (Medical): No    Lack of Transportation (Non-Medical): No  Physical Activity: Not on file  Stress: No Stress Concern Present (07/17/2022)   Received from [facility] - Occupational Stress Questionnaire    Feeling of Stress : Not at all  Social Connections: Unknown (07/16/2022)   Received from [facility]   Social Network    Social Network: Not on file   Additional Social History:                         Sleep: Good  Appetite:  Poor  Current Medications: Current Facility-Administered Medications  Medication Dose Route Frequency Provider Last Rate Last Admin   acetaminophen (TYLENOL) tablet 650 mg  650 mg Oral Q6H PRN [name], NP   650 mg at 06/02/23 2137   alum & mag hydroxide-simeth (MAALOX/MYLANTA) 200-200-20 MG/5ML suspension 30 mL  30 mL Oral Q4H PRN Onuoha, Chinwendu V, NP   30 mL at 05/31/23 0801   busPIRone (BUSPAR) tablet 15 mg  15 mg Oral BID [name], NP   15 mg at 06/03/23 0735   cyanocobalamin (VITAMIN B12) injection 1,000 mcg  1,000 mcg Intramuscular Q30 days Sarita Bottom, MD   1,000 mcg at 06/02/23 1129   diphenhydrAMINE (BENADRYL) capsule 50 mg  50 mg Oral TID PRN  [name], NP   50 mg at 01/15/23 1211   Or   diphenhydrAMINE (BENADRYL) injection 50 mg  50 mg Intramuscular TID PRN [name], NP       docusate sodium (COLACE) capsule 100 mg  100 mg Oral Daily [name], NP   100 mg at 06/03/23 0735   feeding supplement (ENSURE ENLIVE / ENSURE PLUS) liquid 237 mL  237 mL Oral TID BM [name], NP   237 mL at 06/02/23 2138   haloperidol (HALDOL) tablet 5 mg  5 mg Oral TID PRN [name], NP   5 mg at 05/04/23 2725   Or   haloperidol lactate (HALDOL) injection 5 mg  5 mg Intramuscular TID PRN [name], NP       hydrOXYzine (ATARAX) tablet 25 mg  25 mg Oral TID PRN [name], DO   25 mg at 05/27/23 1146   LORazepam (ATIVAN) tablet 2 mg  2 mg Oral TID PRN [name], NP   2 mg at 01/15/23 1211   Or   LORazepam (ATIVAN) injection 2 mg  2 mg Intramuscular TID PRN [name], NP       magnesium hydroxide (MILK OF MAGNESIA) suspension 30 mL  30 mL Oral Daily PRN [name], NP   30 mL at 04/13/23 0814   melatonin tablet 5 mg  5 mg Oral QHS Nkwenti, Doris, NP   5 mg at 06/02/23 2137   nicotine polacrilex (NICORETTE) gum 2 mg  2 mg Oral PRN [name], MD   2 mg at 06/01/23 1647   paliperidone (INVEGA) 24 hr tablet 6 mg  6 mg Oral Daily [name], NP   6 mg at 06/02/23 2137   pantoprazole (PROTONIX) EC tablet 40 mg  40 mg Oral Daily Nwoko, Nicole Kindred I, NP   40 mg at 06/03/23 0735   polyethylene glycol (MIRALAX / GLYCOLAX) packet 17 g  17 g Oral Daily PRN [name], MD       sertraline (ZOLOFT) tablet 150 mg  150 mg Oral Daily Massengill, Nathan, MD   150 mg at 06/03/23 0735   SUMAtriptan (IMITREX) tablet 25 mg  25 mg Oral BID PRN [name], NP   25 mg at 05/07/23 2112   traZODone (DESYREL) tablet 50 mg  50 mg Oral QHS [name], NP   50 mg at 06/02/23 2137   Vitamin D (Ergocalciferol) (DRISDOL) 1.25 MG (50000 UNIT) capsule 50,000 Units  50,000 Units Oral Q7 days [name], NP   50,000 Units at  06/01/23 1404    Lab Results:  No results found for this or any previous visit (from the past 48 hour(s)).   Blood Alcohol level:  Lab Results  Component Value Date  ETH <10 12/19/2022   ETH <10 11/21/2019    Metabolic Disorder Labs: Lab Results  Component Value Date   HGBA1C 4.4 (L) 12/21/2022   MPG 79.58 12/21/2022   No results found for: "PROLACTIN" Lab Results  Component Value Date   CHOL 218 (H) 12/21/2022   TRIG 81 12/21/2022   HDL 53 12/21/2022   CHOLHDL 4.1 12/21/2022   VLDL 16 12/21/2022   LDLCALC 149 (H) 12/21/2022   LDLCALC 110 (H) 03/13/2011    Physical Findings: AIMS: Facial and Oral Movements Muscles of Facial Expression: None Lips and Perioral Area: None Jaw: None Tongue: None,Extremity Movements Upper (arms, wrists, hands, fingers): None Lower (legs, knees, ankles, toes): None, Trunk Movements Neck, shoulders, hips: None, Global Judgements Severity of abnormal movements overall : None Incapacitation due to abnormal movements: None Patient's awareness of abnormal movements: No Awareness, Dental Status Current problems with teeth and/or dentures?: No Does patient usually wear dentures?: No  CIWA:    COWS:     Musculoskeletal: Strength & Muscle Tone: within normal limits Gait & Station: normal Patient leans: N/A  Psychiatric Specialty Exam:  Presentation  General Appearance:  Casual  Eye Contact: Fair  Speech: Clear and Coherent  Speech Volume: Decreased  Handedness: Right   Mood and Affect  Mood: Euthymic  Affect: Restricted   Thought Process  Thought Processes: Linear  Descriptions of Associations:Intact  Orientation:Full (Time, Place and Person)  Thought Content:Logical  History of Schizophrenia/Schizoaffective disorder:Yes  Duration of Psychotic Symptoms:Greater than six months  Hallucinations:No data recorded    Ideas of Reference:None  Suicidal Thoughts:No data recorded    Homicidal  Thoughts:No data recorded     Sensorium  Memory: Immediate Fair; Recent Fair; Remote Poor  Judgment: Fair  Insight: Fair   Chartered certified accountant: Fair  Attention Span: Fair  Recall: [employer] of Knowledge: Fair  Language: Fair   Psychomotor Activity  Psychomotor Activity: No data recorded     Assets  Assets: Communication Skills; Desire for Improvement   Sleep  Sleep: No data recorded      Physical Exam: General: Sitting comfortably. NAD. HEENT: Normocephalic, atraumatic, MMM, EMOI Lungs: no increased work of breathing noted Heart: no cyanosis Abdomen: Non distended Musculoskeletal: FROM. No obvious deformities Skin: Warm, dry, intact. No rashes noted Neuro: No obvious focal deficits.  Gait and station are normal  Review of Systems  Constitutional: Negative.   HENT: Negative.    Eyes: Negative.   Respiratory: Negative.    Cardiovascular: Negative.   Gastrointestinal: Negative.   Genitourinary: Negative.   Skin: Negative.   Neurological: Negative.   Psychiatric/Behavioral:  Positive for depression.     Blood pressure 98/65, pulse 86, temperature 98 F (36.7 C), temperature source Oral, resp. rate 16, height 4\' 11"  (1.499 m), weight 63 kg, SpO2 97%. Body mass index is 28.05 kg/m.   Treatment Plan Summary: Daily contact with patient to assess and evaluate symptoms and progress in treatment and Medication management.    Still waiting on safe disposition as patient would not be able to live independently given cognitive impairment. Recommend assisted living.  The patient's disposition remains unresolved.  No real progress on discharge plans.  No medication side effects reported.  Because of urinary incontinence, recommended adult diapers to be worn.   Principal/active diagnoses:  Schizoaffective disorder, depressive type (HCC)   Active Problems: GERD (gastroesophageal reflux disease) Intellectual  disability Tobacco use disorder (MOCA 16/30) moderate cognitive impairment probably related to moderate intellectual disability.  Plan:  -  Continue Imitrex 25 mg PRN BID for migraines -Continue Colace 100 mg daily for constipation -Continue Vitamin D 50.000 units weekly for bone health. -Continue Sertraline150 mg po Q daily for depression/anxiety -Continue Buspar 15 mg po bid for anxiety.  -Continue paliperidone 6 mg po qd for mood stabilization -Continue Trazodone 50 mg nightly for insomnia  -Continue Nicoderm 21 mg topically Q 24 hrs for nicotine withdrawal management. -Continue vitamin B12 1000 mcg IM weekly for 4 weeks then monthly -Continue Melatonin 5 mg nightly for sleep -Continue Protonix EC 40 mg p.o. daily for GERD -Continue MiraLAX 17 g p.o. PRN for constipation, we will hold for a few days. -Continue hydroxyzine 25 mg p.o. 3 times daily as needed for anxiety -Continue Ensure nutritional shakes TID in between meals  -Previously discontinued Hydroxyzine 50 mg nightly-hypotension in the mornings  -Imodium for diarrhea as needed. Safety and Monitoring: Voluntary admission to inpatient psychiatric unit for safety, stabilization and treatment Daily contact with patient to assess and evaluate symptoms and progress in treatment Patient's case to be discussed in multi-disciplinary team meeting Observation Level : q15 minute checks Vital signs: q12 hours Precautions: Safety   Discharge Planning: Social work and case management to assist with discharge planning and identification of hospital follow-up needs prior to discharge Estimated LOS: Unknown at this time. Discharge Concerns: Need to establish a safety plan; Medication compliance and effectiveness Discharge Goals: Return home with outpatient referrals for mental health follow-up including medication management/psychotherapy Apparently patient now has a legal guardian.  Placement is in progress.  [name],  MD 06/03/2023, 9:32 AM Patient ID: Yolanda [name], female   DOB: 04/08/1976, 47 y.o.   MRN: [id number]

## 2023-06-04 DIAGNOSIS — F251 Schizoaffective disorder, depressive type: Secondary | ICD-10-CM | POA: Diagnosis not present

## 2023-06-04 MED ORDER — POLYETHYLENE GLYCOL 3350 17 G PO PACK
17.0000 g | PACK | Freq: Every day | ORAL | Status: DC
  Administered 2023-06-05 – 2023-06-30 (×14): 17 g via ORAL
  Filled 2023-06-04 (×40): qty 1

## 2023-06-04 NOTE — Plan of Care (Signed)
  Problem: Coping: Goal: Coping ability will improve Outcome: Progressing Goal: Will verbalize feelings Outcome: Progressing   

## 2023-06-04 NOTE — Progress Notes (Signed)
   06/04/23 0015  Psych Admission Type (Psych Patients Only)  Admission Status Involuntary  Psychosocial Assessment  Patient Complaints Worrying  Eye Contact Fair  Facial Expression Anxious  Affect Appropriate to circumstance  Speech Logical/coherent  Interaction Cautious;Childlike  Motor Activity Slow  Appearance/Hygiene Disheveled  Behavior Characteristics Cooperative  Mood Anxious  Aggressive Behavior  Effect No apparent injury  Thought Process  Coherency WDL  Content WDL  Delusions WDL  Perception WDL  Hallucination None reported or observed  Judgment WDL  Confusion WDL  Danger to Self  Current suicidal ideation? Denies  Danger to Others  Danger to Others None reported or observed

## 2023-06-04 NOTE — Plan of Care (Signed)
Problem: Coping: Goal: Coping ability will improve Outcome: Progressing Goal: Will verbalize feelings Outcome: Progressing   Problem: Self-Concept: Goal: Level of anxiety will decrease Outcome: Progressing

## 2023-06-04 NOTE — [place] FL2 (Signed)
[place] MEDICAID FL2 LEVEL OF CARE FORM     IDENTIFICATION  Patient Name: Yolanda [name] Birthdate: 10-19-1975 Sex: female Admission Date (Current Location): 12/20/2022  [facility] and [place] Number:  Producer, television/film/video and Address:  [facility] [place], [address] [place], [place] [address]      Provider Number:    Attending Physician Name and Address:  [name], MD  Relative Name and Phone Number:  Layney Feeley [phone]    Current Level of Care: Hospital Recommended Level of Care: Other (Comment) (Group Home) Prior Approval Number:    Date Approved/Denied:   PASRR Number:    Discharge Plan: Other (Comment) (Group Home)    Current Diagnoses: Patient Active Problem List   Diagnosis Date Noted   Tobacco use disorder 01/01/2023   Bipolar 1 disorder, depressed (HCC) 11/13/2022   Suicide ideation 11/13/2022   Vitamin D deficiency 07/21/2022   Schizoaffective disorder (HCC) 07/18/2022   Trauma 01/17/2022   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   Pancreatic mass 07/31/2021   Self-cutting of wrist (HCC) 07/24/2020   Intellectual disability 07/23/2020   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   Non compliance w medication regimen 11/16/2019   Schizoaffective disorder, depressive type (HCC) 11/13/2019   GERD (gastroesophageal reflux disease) 06/03/2018    Orientation RESPIRATION BLADDER Height & Weight     Self, Time  Normal Incontinent Weight: 63 kg Height:  4\' 11"  (149.9 cm)  BEHAVIORAL SYMPTOMS/MOOD NEUROLOGICAL BOWEL NUTRITION STATUS      Continent    AMBULATORY STATUS COMMUNICATION OF NEEDS Skin   Independent Verbally Normal                       Personal Care Assistance Level of Assistance              Functional Limitations Info  Speech     Speech Info: Impaired    SPECIAL CARE FACTORS FREQUENCY                       Contractures       Additional Factors Info                  Current Medications (06/04/2023):  This is the current hospital active medication list Current Facility-Administered Medications  Medication Dose Route Frequency Provider Last Rate Last Admin   acetaminophen (TYLENOL) tablet 650 mg  650 mg Oral Q6H PRN [name], NP   650 mg at 06/02/23 2137   alum & mag hydroxide-simeth (MAALOX/MYLANTA) 200-200-20 MG/5ML suspension 30 mL  30 mL Oral Q4H PRN Onuoha, Chinwendu V, NP   30 mL at 05/31/23 0801   busPIRone (BUSPAR) tablet 15 mg  15 mg Oral BID [name], NP   15 mg at 06/04/23 0819   cyanocobalamin (VITAMIN B12) injection 1,000 mcg  1,000 mcg Intramuscular Q30 days Sarita Bottom, MD   1,000 mcg at 06/02/23 1129   diphenhydrAMINE (BENADRYL) capsule 50 mg  50 mg Oral TID PRN [name], NP   50 mg at 06/03/23 1110   Or   diphenhydrAMINE (BENADRYL) injection 50 mg  50 mg Intramuscular TID PRN [name], NP       docusate sodium (COLACE) capsule 100 mg  100 mg Oral Daily Nkwenti, Doris, NP   100 mg at 06/04/23 0818   feeding supplement (ENSURE ENLIVE / ENSURE PLUS) liquid 237 mL  237  mL Oral TID BM [name], NP   237 mL at 06/04/23 1459   haloperidol (HALDOL) tablet 5 mg  5 mg Oral TID PRN [name], NP   5 mg at 06/03/23 1110   Or   haloperidol lactate (HALDOL) injection 5 mg  5 mg Intramuscular TID PRN [name], NP       hydrOXYzine (ATARAX) tablet 25 mg  25 mg Oral TID PRN [name], DO   25 mg at 05/27/23 1146   LORazepam (ATIVAN) tablet 2 mg  2 mg Oral TID PRN [name], NP   2 mg at 06/03/23 1110   Or   LORazepam (ATIVAN) injection 2 mg  2 mg Intramuscular TID PRN [name], NP       magnesium hydroxide (MILK OF MAGNESIA) suspension 30 mL  30 mL Oral Daily PRN [name], NP   30 mL at 04/13/23 0814   melatonin tablet 5 mg  5 mg Oral QHS Nkwenti, Doris, NP   5 mg at 06/03/23 2138   nicotine polacrilex (NICORETTE) gum 2 mg  2 mg Oral PRN [name], MD   2 mg at 06/03/23 1651   paliperidone (INVEGA) 24 hr tablet 6 mg  6 mg Oral Daily [name], NP   6 mg at 06/03/23 2137   pantoprazole (PROTONIX) EC tablet 40 mg  40 mg Oral Daily [name], NP   40 mg at 06/04/23 0818   polyethylene glycol (MIRALAX / GLYCOLAX) packet 17 g  17 g Oral Daily PRN [name], MD       polyethylene glycol (MIRALAX / GLYCOLAX) packet 17 g  17 g Oral Daily [name], MD       sertraline (ZOLOFT) tablet 150 mg  150 mg Oral Daily Massengill, Harrold Donath, MD   150 mg at 06/04/23 2536   SUMAtriptan (IMITREX) tablet 25 mg  25 mg Oral BID PRN [name], NP   25 mg at 05/07/23 2112   traZODone (DESYREL) tablet 50 mg  50 mg Oral QHS [name], NP   50 mg at 06/03/23 2137   Vitamin D (Ergocalciferol) (DRISDOL) 1.25 MG (50000 UNIT) capsule 50,000 Units  50,000 Units Oral Q7 days [name], NP   50,000 Units at 06/01/23 1404     Discharge Medications: Please see discharge summary for a list of discharge medications.  Relevant Imaging Results:  Relevant Lab Results:   Additional Information    Marinda Elk, LCSW

## 2023-06-04 NOTE — Progress Notes (Signed)
[facility] MD Progress Note  06/04/2023 9:52 AM Yolanda [name]  MRN:  [id number] Subjective: Per nursing report, the patient remains somewhat withdrawn today.  Compared to the last few days she is little bit more isolated.  She is taking the medications as prescribed and denied any side effects and has not had any PRNs.  No further diarrhea or constipation documented.  Placement is still an ongoing problem and according to the caseworker they may have found an appropriate placement and are working on the logistics.   Principal Problem: Schizoaffective disorder, depressive type (HCC) Diagnosis: Principal Problem:   Schizoaffective disorder, depressive type (HCC) Active Problems:   GERD (gastroesophageal reflux disease)   Intellectual disability   Tobacco use disorder  Total Time spent with patient: 15 minutes  Past Psychiatric History: see H&P  Past Medical History:  Past Medical History:  Diagnosis Date   Bipolar affect, depressed (HCC)    Constipation 08/17/2022   Depression    Falls 07/21/2022   Fracture of femoral neck, right, closed (HCC) 01/17/2022   Herpes simplex 08/22/2017   Open fracture dislocation of right elbow joint 01/17/2022    Past Surgical History:  Procedure Laterality Date   NO PAST SURGERIES     SALPINGECTOMY     Family History: History reviewed. No pertinent family history. Family Psychiatric  History: see H&P Social History:  Social History   Substance and Sexual Activity  Alcohol Use Yes     Social History   Substance and Sexual Activity  Drug Use Yes   Types: Cocaine, Marijuana    Social History   Socioeconomic History   Marital status: Single    Spouse name: Not on file   Number of children: Not on file   Years of education: Not on file   Highest education level: Not on file  Occupational History   Not on file  Tobacco Use   Smoking status: Every Day   Smokeless tobacco: Not on file  Substance and Sexual Activity   Alcohol use: Yes    Drug use: Yes    Types: Cocaine, Marijuana   Sexual activity: Yes  Other Topics Concern   Not on file  Social History Narrative   Not on file   Social Determinants of Health   Financial Resource Strain: Low Risk  (09/12/2022)   Received from [facility], [facility]   Overall Financial Resource Strain (CARDIA)    Difficulty of Paying Living Expenses: Not hard at all  Food Insecurity: Patient Declined (12/20/2022)   Hunger Vital Sign    Worried About Running Out of Food in the Last Year: Patient declined    Ran Out of Food in the Last Year: Patient declined  Transportation Needs: No Transportation Needs (12/20/2022)   PRAPARE - Administrator, Civil Service (Medical): No    Lack of Transportation (Non-Medical): No  Physical Activity: Not on file  Stress: No Stress Concern Present (07/17/2022)   Received from [facility], [facility] - Occupational Stress Questionnaire    Feeling of Stress : Not at all  Social Connections: Unknown (07/16/2022)   Received from [facility], [facility]   Social Network    Social Network: Not on file   Additional Social History:                         Sleep: Good  Appetite:  Poor  Current Medications: Current Facility-Administered Medications  Medication Dose Route Frequency Provider Last Rate Last Admin   acetaminophen (TYLENOL) tablet 650 mg  650 mg Oral Q6H PRN [name], NP   650 mg at 06/02/23 2137   alum & mag hydroxide-simeth (MAALOX/MYLANTA) 200-200-20 MG/5ML suspension 30 mL  30 mL Oral Q4H PRN Onuoha, Chinwendu V, NP   30 mL at 05/31/23 0801   busPIRone (BUSPAR) tablet 15 mg  15 mg Oral BID [name], NP   15 mg at 06/04/23 1610   cyanocobalamin (VITAMIN B12) injection 1,000 mcg  1,000 mcg Intramuscular Q30 days Sarita Bottom, MD   1,000 mcg at 06/02/23 1129   diphenhydrAMINE (BENADRYL) capsule 50 mg  50 mg Oral TID PRN [name], NP   50 mg at  06/03/23 1110   Or   diphenhydrAMINE (BENADRYL) injection 50 mg  50 mg Intramuscular TID PRN [name], NP       docusate sodium (COLACE) capsule 100 mg  100 mg Oral Daily [name], NP   100 mg at 06/04/23 0818   feeding supplement (ENSURE ENLIVE / ENSURE PLUS) liquid 237 mL  237 mL Oral TID BM [name], NP   237 mL at 06/03/23 2139   haloperidol (HALDOL) tablet 5 mg  5 mg Oral TID PRN [name], NP   5 mg at 06/03/23 1110   Or   haloperidol lactate (HALDOL) injection 5 mg  5 mg Intramuscular TID PRN [name], NP       hydrOXYzine (ATARAX) tablet 25 mg  25 mg Oral TID PRN [name], DO   25 mg at 05/27/23 1146   LORazepam (ATIVAN) tablet 2 mg  2 mg Oral TID PRN [name], NP   2 mg at 06/03/23 1110   Or   LORazepam (ATIVAN) injection 2 mg  2 mg Intramuscular TID PRN [name], NP       magnesium hydroxide (MILK OF MAGNESIA) suspension 30 mL  30 mL Oral Daily PRN [name], NP   30 mL at 04/13/23 0814   melatonin tablet 5 mg  5 mg Oral QHS Nkwenti, Doris, NP   5 mg at 06/03/23 2138   nicotine polacrilex (NICORETTE) gum 2 mg  2 mg Oral PRN [name], MD   2 mg at 06/03/23 1651   paliperidone (INVEGA) 24 hr tablet 6 mg  6 mg Oral Daily [name], NP   6 mg at 06/03/23 2137   pantoprazole (PROTONIX) EC tablet 40 mg  40 mg Oral Daily Nwoko, Nicole Kindred I, NP   40 mg at 06/04/23 0818   polyethylene glycol (MIRALAX / GLYCOLAX) packet 17 g  17 g Oral Daily PRN [name], MD       sertraline (ZOLOFT) tablet 150 mg  150 mg Oral Daily Massengill, Harrold Donath, MD   150 mg at 06/04/23 9604   SUMAtriptan (IMITREX) tablet 25 mg  25 mg Oral BID PRN [name], NP   25 mg at 05/07/23 2112   traZODone (DESYREL) tablet 50 mg  50 mg Oral QHS [name], NP   50 mg at 06/03/23 2137   Vitamin D (Ergocalciferol) (DRISDOL) 1.25 MG (50000 UNIT) capsule 50,000 Units  50,000 Units Oral Q7 days [name], NP   50,000 Units at 06/01/23 1404    Lab Results:   No results found for this or any previous visit (from the past 48 hour(s)).   Blood Alcohol level:  Lab Results  Component Value Date   ETH <10 12/19/2022  ETH <10 11/21/2019    Metabolic Disorder Labs: Lab Results  Component Value Date   HGBA1C 4.4 (L) 12/21/2022   MPG 79.58 12/21/2022   No results found for: "PROLACTIN" Lab Results  Component Value Date   CHOL 218 (H) 12/21/2022   TRIG 81 12/21/2022   HDL 53 12/21/2022   CHOLHDL 4.1 12/21/2022   VLDL 16 12/21/2022   LDLCALC 149 (H) 12/21/2022   LDLCALC 110 (H) 03/13/2011    Physical Findings: AIMS: Facial and Oral Movements Muscles of Facial Expression: None Lips and Perioral Area: None Jaw: None Tongue: None,Extremity Movements Upper (arms, wrists, hands, fingers): None Lower (legs, knees, ankles, toes): None, Trunk Movements Neck, shoulders, hips: None, Global Judgements Severity of abnormal movements overall : None Incapacitation due to abnormal movements: None Patient's awareness of abnormal movements: No Awareness, Dental Status Current problems with teeth and/or dentures?: No Does patient usually wear dentures?: No  CIWA:    COWS:     Musculoskeletal: Strength & Muscle Tone: within normal limits Gait & Station: normal Patient leans: N/A  Psychiatric Specialty Exam:  Presentation  General Appearance:  Casual; Disheveled  Eye Contact: Fair  Speech: Slow  Speech Volume: Decreased  Handedness: Right   Mood and Affect  Mood: Dysphoric  Affect: Restricted   Thought Process  Thought Processes: Coherent  Descriptions of Associations:Intact  Orientation:Full (Time, Place and Person)  Thought Content:Rumination  History of Schizophrenia/Schizoaffective disorder:No  Duration of Psychotic Symptoms:N/A  Hallucinations:Hallucinations: None     Ideas of Reference:None  Suicidal Thoughts:Suicidal Thoughts: No     Homicidal Thoughts:Homicidal Thoughts:  No      Sensorium  Memory: Other (comment) (Not tested)  Judgment: Fair  Insight: Fair   Executive Functions  Concentration: Fair  Attention Span: Fair  Recall: [employer] of Knowledge: Fair  Language: Fair   Psychomotor Activity  Psychomotor Activity: Psychomotor Activity: Normal      Assets  Assets: Communication Skills; Desire for Improvement   Sleep  Sleep: Sleep: Fair Number of Hours of Sleep: 5.75       Physical Exam: General: Sitting comfortably. NAD. HEENT: Normocephalic, atraumatic, MMM, EMOI Lungs: no increased work of breathing noted Heart: no cyanosis Abdomen: Non distended Musculoskeletal: FROM. No obvious deformities Skin: Warm, dry, intact. No rashes noted Neuro: No obvious focal deficits.  Gait and station are normal  Review of Systems  Constitutional: Negative.   HENT: Negative.    Eyes: Negative.   Respiratory: Negative.    Cardiovascular: Negative.   Gastrointestinal: Negative.   Genitourinary: Negative.   Skin: Negative.   Neurological: Negative.   Psychiatric/Behavioral:  Positive for depression.     Blood pressure 97/60, pulse 63, temperature 98 F (36.7 C), temperature source Oral, resp. rate 16, height 4\' 11"  (1.499 m), weight 63 kg, SpO2 97%. Body mass index is 28.05 kg/m.   Treatment Plan Summary: Daily contact with patient to assess and evaluate symptoms and progress in treatment and Medication management.    No change at all.  Waiting on placement. No medication side effects reported.  Because of urinary incontinence, recommended adult diapers to be worn.   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  Plan: No change in medications. -Continue Imitrex 25 mg PRN  BID for migraines -Continue Colace 100 mg daily for constipation -Continue Vitamin D 50.000 units weekly for bone health. -  Continue Sertraline150 mg po Q daily for depression/anxiety -Continue Buspar 15 mg po bid for anxiety.  -Continue paliperidone 6 mg po qd for mood stabilization -Continue Trazodone 50 mg nightly for insomnia  -Continue Nicoderm 21 mg topically Q 24 hrs for nicotine withdrawal management. -Continue vitamin B12 1000 mcg IM weekly for 4 weeks then monthly -Continue Melatonin 5 mg nightly for sleep -Continue Protonix EC 40 mg p.o. daily for GERD -Restart MiraLAX 17 g p.o. PRN for constipation. -Continue hydroxyzine 25 mg p.o. 3 times daily as needed for anxiety -Continue Ensure nutritional shakes TID in between meals  -Previously discontinued Hydroxyzine 50 mg nightly-hypotension in the mornings  -Imodium for diarrhea as needed. Safety and Monitoring: Voluntary admission to inpatient psychiatric unit for safety, stabilization and treatment Daily contact with patient to assess and evaluate symptoms and progress in treatment Patient's case to be discussed in multi-disciplinary team meeting Observation Level : q15 minute checks Vital signs: q12 hours Precautions: Safety   Discharge Planning: Social work and case management to assist with discharge planning and identification of hospital follow-up needs prior to discharge Estimated LOS: Unknown at this time. Discharge Concerns: Need to establish a safety plan; Medication compliance and effectiveness Discharge Goals: Return home with outpatient referrals for mental health follow-up including medication management/psychotherapy Apparently patient now has a legal guardian.  Placement is in progress.  [name], MD 06/04/2023, 9:52 AM Patient ID: Yolanda [name], female   DOB: 1976-06-15, 47 y.o.   MRN: [id number]  Patient ID: Yolanda [name], female   DOB: [birth year], 47 y.o.   MRN: [id number]

## 2023-06-04 NOTE — Progress Notes (Signed)
   06/04/23 2015  Psychosocial Assessment  Patient Complaints None  Eye Contact Fair  Facial Expression Anxious  Affect Appropriate to circumstance  Speech Logical/coherent  Interaction Cautious;Childlike  Motor Activity Slow  Appearance/Hygiene Disheveled  Behavior Characteristics Cooperative  Mood Depressed;Anxious  Aggressive Behavior  Effect No apparent injury  Thought Process  Coherency WDL  Content WDL  Delusions WDL  Perception WDL  Hallucination None reported or observed  Judgment WDL  Confusion WDL  Danger to Self  Current suicidal ideation? Denies  Danger to Others  Danger to Others None reported or observed

## 2023-06-04 NOTE — Group Note (Signed)
[facility] LCSW Group Therapy Note   Group Date: 06/04/2023 Start Time: 1100 End Time: 1200  Type of Therapy:  Group Therapy  Participation Level:  Patient did not attend group on today. Patients have been encouraged to participate in all programming on milieu.   Loleta Dicker, LCSW

## 2023-06-04 NOTE — Plan of Care (Signed)
  Problem: Medication: Goal: Compliance with prescribed medication regimen will improve Outcome: Progressing   Problem: Education: Goal: Emotional status will improve Outcome: Progressing   Problem: Education: Goal: Mental status will improve Outcome: Progressing   Problem: Safety: Goal: Periods of time without injury will increase Outcome: Progressing

## 2023-06-04 NOTE — Progress Notes (Signed)
   06/04/23 0805  Psych Admission Type (Psych Patients Only)  Admission Status Involuntary  Psychosocial Assessment  Patient Complaints None  Eye Contact Fair  Facial Expression Anxious  Affect Appropriate to circumstance  Speech Logical/coherent  Interaction Childlike  Motor Activity Slow  Appearance/Hygiene Improved  Behavior Characteristics Cooperative  Mood Anxious  Thought Process  Coherency WDL  Content WDL  Delusions None reported or observed  Perception WDL  Hallucination None reported or observed  Judgment WDL  Confusion WDL  Danger to Self  Current suicidal ideation? Denies  Agreement Not to Harm Self Yes  Description of Agreement Verbal  Danger to Others  Danger to Others None reported or observed

## 2023-06-05 DIAGNOSIS — F251 Schizoaffective disorder, depressive type: Secondary | ICD-10-CM | POA: Diagnosis not present

## 2023-06-05 NOTE — Progress Notes (Signed)
   06/05/23 2100  Psych Admission Type (Psych Patients Only)  Admission Status Involuntary  Psychosocial Assessment  Patient Complaints None  Eye Contact Fair  Facial Expression Anxious  Affect Appropriate to circumstance  Speech Logical/coherent  Interaction Cautious;Childlike  Motor Activity Slow  Appearance/Hygiene Disheveled  Behavior Characteristics Cooperative  Mood Depressed;Anxious  Aggressive Behavior  Effect No apparent injury  Thought Process  Coherency WDL  Content WDL  Delusions WDL  Perception WDL  Hallucination None reported or observed  Judgment WDL  Confusion WDL  Danger to Self  Current suicidal ideation? Denies  Danger to Others  Danger to Others None reported or observed

## 2023-06-05 NOTE — Progress Notes (Signed)
   06/05/23 0810  Psych Admission Type (Psych Patients Only)  Admission Status Involuntary  Psychosocial Assessment  Patient Complaints None  Eye Contact Fair  Facial Expression Anxious  Affect Appropriate to circumstance  Speech Logical/coherent  Interaction Childlike  Motor Activity Slow  Appearance/Hygiene Improved  Behavior Characteristics Cooperative  Mood Depressed;Anxious  Thought Process  Coherency WDL  Content WDL  Delusions None reported or observed;Persecutory  Perception WDL  Hallucination None reported or observed  Judgment WDL  Confusion WDL  Danger to Self  Current suicidal ideation? Denies  Agreement Not to Harm Self Yes  Description of Agreement Verbal  Danger to Others  Danger to Others None reported or observed

## 2023-06-05 NOTE — Plan of Care (Signed)
  Problem: Activity: Goal: Interest or engagement in leisure activities will improve Outcome: Progressing   Problem: Coping: Goal: Coping ability will improve Outcome: Progressing   Problem: Coping: Goal: Coping ability will improve Outcome: Progressing

## 2023-06-05 NOTE — Plan of Care (Signed)
  Problem: Activity: Goal: Imbalance in normal sleep/wake cycle will improve Outcome: Progressing   Problem: Coping: Goal: Coping ability will improve Outcome: Progressing Goal: Will verbalize feelings Outcome: Progressing   Problem: Safety: Goal: Periods of time without injury will increase Outcome: Progressing

## 2023-06-05 NOTE — Group Note (Signed)
Recreation Therapy Group Note   Group Topic:Team Building  Group Date: 06/05/2023 Start Time: 0935 End Time: 1015 Facilitators: Lanae Federer-McCall, LRT,CTRS Location: [address] Day[address]   Group Topic: Communication, Team Building, Problem Solving  Goal Area(s) Addresses:  Patient will effectively work with peer towards shared goal.  Patient will identify skills used to make activity successful.  Patient will identify how skills used during activity can be used to reach post d/c goals.   Intervention: STEM Activity  Group Description: Straw Bridge. In teams of 3-5, patients were given 15 plastic drinking straws and an equal length of masking tape. Using the materials provided, patients were instructed to build a free standing bridge-like structure to suspend an everyday item (ex: puzzle box) off of the floor or table surface. All materials were required to be used by the team in their design. LRT facilitated post-activity discussion reviewing team process. Patients were encouraged to reflect how the skills used in this activity can be generalized to daily life post discharge.   Education: Pharmacist, community, Scientist, physiological, Discharge Planning   Education Outcome: Acknowledges education/In group clarification offered/Needs additional education.    Affect/Mood: N/A   Participation Level: Did not attend    Clinical Observations/Individualized Feedback:     Plan: Continue to engage patient in RT group sessions 2-3x/week.   Estle Sabella-McCall, LRT,CTRS  06/05/2023 12:20 PM

## 2023-06-05 NOTE — [facility] Group Notes (Signed)
Pt did not attend NA group 

## 2023-06-05 NOTE — Progress Notes (Signed)
[facility] MD Progress Note  06/05/2023 12:03 PM Yolanda [name]  MRN:  [id number] Subjective: Per nursing report, the patient has been cooperative but remains somewhat withdrawn.  She is focused on wanting to go to her mother's house.  She reports that her mother works full-time but is willing to take her home for Thanksgiving.  Unfortunately when the mother is working there is no one else to monitor her.  Patient is compliant with medications.  Placement is still pending.  No changes. Contract for safety.   Principal Problem: Schizoaffective disorder, depressive type (HCC) Diagnosis: Principal Problem:   Schizoaffective disorder, depressive type (HCC) Active Problems:   GERD (gastroesophageal reflux disease)   Intellectual disability   Tobacco use disorder  Total Time spent with patient: 15 minutes  Past Psychiatric History: see H&P  Past Medical History:  Past Medical History:  Diagnosis Date   Bipolar affect, depressed (HCC)    Constipation 08/17/2022   Depression    Falls 07/21/2022   Fracture of femoral neck, right, closed (HCC) 01/17/2022   Herpes simplex 08/22/2017   Open fracture dislocation of right elbow joint 01/17/2022    Past Surgical History:  Procedure Laterality Date   NO PAST SURGERIES     SALPINGECTOMY     Family History: History reviewed. No pertinent family history. Family Psychiatric  History: see H&P Social History:  Social History   Substance and Sexual Activity  Alcohol Use Yes     Social History   Substance and Sexual Activity  Drug Use Yes   Types: Cocaine, Marijuana    Social History   Socioeconomic History   Marital status: Single    Spouse name: Not on file   Number of children: Not on file   Years of education: Not on file   Highest education level: Not on file  Occupational History   Not on file  Tobacco Use   Smoking status: Every Day   Smokeless tobacco: Not on file  Substance and Sexual Activity   Alcohol use: Yes   Drug  use: Yes    Types: Cocaine, Marijuana   Sexual activity: Yes  Other Topics Concern   Not on file  Social History Narrative   Not on file   Social Determinants of Health   Financial Resource Strain: Low Risk  (09/12/2022)   Received from [facility], [facility]   Overall Financial Resource Strain (CARDIA)    Difficulty of Paying Living Expenses: Not hard at all  Food Insecurity: Patient Declined (12/20/2022)   Hunger Vital Sign    Worried About Running Out of Food in the Last Year: Patient declined    Ran Out of Food in the Last Year: Patient declined  Transportation Needs: No Transportation Needs (12/20/2022)   PRAPARE - Administrator, Civil Service (Medical): No    Lack of Transportation (Non-Medical): No  Physical Activity: Not on file  Stress: No Stress Concern Present (07/17/2022)   Received from [facility], [facility] - Occupational Stress Questionnaire    Feeling of Stress : Not at all  Social Connections: Unknown (07/16/2022)   Received from [facility], [facility]   Social Network    Social Network: Not on file   Additional Social History:                         Sleep: Good  Appetite:  Poor  Current Medications: Current Facility-Administered Medications  Medication Dose Route Frequency Provider Last Rate Last Admin   acetaminophen (TYLENOL) tablet 650 mg  650 mg Oral Q6H PRN [name], NP   650 mg at 06/02/23 2137   alum & mag hydroxide-simeth (MAALOX/MYLANTA) 200-200-20 MG/5ML suspension 30 mL  30 mL Oral Q4H PRN Onuoha, Chinwendu V, NP   30 mL at 05/31/23 0801   busPIRone (BUSPAR) tablet 15 mg  15 mg Oral BID [name], NP   15 mg at 06/05/23 0802   cyanocobalamin (VITAMIN B12) injection 1,000 mcg  1,000 mcg Intramuscular Q30 days Sarita Bottom, MD   1,000 mcg at 06/02/23 1129   diphenhydrAMINE (BENADRYL) capsule 50 mg  50 mg Oral TID PRN [name], NP   50 mg at 06/03/23  1110   Or   diphenhydrAMINE (BENADRYL) injection 50 mg  50 mg Intramuscular TID PRN [name], NP       docusate sodium (COLACE) capsule 100 mg  100 mg Oral Daily [name], NP   100 mg at 06/05/23 0802   feeding supplement (ENSURE ENLIVE / ENSURE PLUS) liquid 237 mL  237 mL Oral TID BM [name], NP   237 mL at 06/05/23 1052   haloperidol (HALDOL) tablet 5 mg  5 mg Oral TID PRN [name], NP   5 mg at 06/03/23 1110   Or   haloperidol lactate (HALDOL) injection 5 mg  5 mg Intramuscular TID PRN [name], NP       hydrOXYzine (ATARAX) tablet 25 mg  25 mg Oral TID PRN [name], DO   25 mg at 05/27/23 1146   LORazepam (ATIVAN) tablet 2 mg  2 mg Oral TID PRN [name], NP   2 mg at 06/03/23 1110   Or   LORazepam (ATIVAN) injection 2 mg  2 mg Intramuscular TID PRN [name], NP       magnesium hydroxide (MILK OF MAGNESIA) suspension 30 mL  30 mL Oral Daily PRN [name], NP   30 mL at 04/13/23 0814   melatonin tablet 5 mg  5 mg Oral QHS Nkwenti, Doris, NP   5 mg at 06/04/23 2115   nicotine polacrilex (NICORETTE) gum 2 mg  2 mg Oral PRN [name], MD   2 mg at 06/04/23 1626   paliperidone (INVEGA) 24 hr tablet 6 mg  6 mg Oral Daily [name], NP   6 mg at 06/04/23 2116   pantoprazole (PROTONIX) EC tablet 40 mg  40 mg Oral Daily Nwoko, Nicole Kindred I, NP   40 mg at 06/05/23 0802   polyethylene glycol (MIRALAX / GLYCOLAX) packet 17 g  17 g Oral Daily PRN [name], MD   17 g at 06/04/23 1624   polyethylene glycol (MIRALAX / GLYCOLAX) packet 17 g  17 g Oral Daily [name], MD   17 g at 06/05/23 0803   sertraline (ZOLOFT) tablet 150 mg  150 mg Oral Daily Massengill, Harrold Donath, MD   150 mg at 06/05/23 0803   SUMAtriptan (IMITREX) tablet 25 mg  25 mg Oral BID PRN [name], NP   25 mg at 05/07/23 2112   traZODone (DESYREL) tablet 50 mg  50 mg Oral QHS [name], NP   50 mg at 06/04/23 2115   Vitamin D (Ergocalciferol) (DRISDOL) 1.25 MG  (50000 UNIT) capsule 50,000 Units  50,000 Units Oral Q7 days [name], NP   50,000 Units at 06/01/23 1404    Lab Results:  No results found for  this or any previous visit (from the past 48 hour(s)).   Blood Alcohol level:  Lab Results  Component Value Date   ETH <10 12/19/2022   ETH <10 11/21/2019    Metabolic Disorder Labs: Lab Results  Component Value Date   HGBA1C 4.4 (L) 12/21/2022   MPG 79.58 12/21/2022   No results found for: "PROLACTIN" Lab Results  Component Value Date   CHOL 218 (H) 12/21/2022   TRIG 81 12/21/2022   HDL 53 12/21/2022   CHOLHDL 4.1 12/21/2022   VLDL 16 12/21/2022   LDLCALC 149 (H) 12/21/2022   LDLCALC 110 (H) 03/13/2011    Physical Findings: AIMS: Facial and Oral Movements Muscles of Facial Expression: None Lips and Perioral Area: None Jaw: None Tongue: None,Extremity Movements Upper (arms, wrists, hands, fingers): None Lower (legs, knees, ankles, toes): None, Trunk Movements Neck, shoulders, hips: None, Global Judgements Severity of abnormal movements overall : None Incapacitation due to abnormal movements: None Patient's awareness of abnormal movements: No Awareness, Dental Status Current problems with teeth and/or dentures?: No Does patient usually wear dentures?: No  CIWA:    COWS:     Musculoskeletal: Strength & Muscle Tone: within normal limits Gait & Station: normal Patient leans: N/A  Psychiatric Specialty Exam:  Presentation  General Appearance:  Casual  Eye Contact: Fair  Speech: Clear and Coherent  Speech Volume: Normal  Handedness: Right   Mood and Affect  Mood: Anxious  Affect: Restricted   Thought Process  Thought Processes: Coherent  Descriptions of Associations:Intact  Orientation:Full (Time, Place and Person)  Thought Content:Rumination  History of Schizophrenia/Schizoaffective disorder:Yes  Duration of Psychotic Symptoms:Greater than six  months  Hallucinations:Hallucinations: None     Ideas of Reference:None  Suicidal Thoughts:Suicidal Thoughts: No     Homicidal Thoughts:Homicidal Thoughts: No      Sensorium  Memory: Immediate Fair; Remote Fair; Recent Fair  Judgment: Fair  Insight: Fair   Art therapist  Concentration: Fair  Attention Span: Fair  Recall: [employer] of Knowledge: Fair  Language: Fair   Psychomotor Activity  Psychomotor Activity: Psychomotor Activity: Normal      Assets  Assets: Communication Skills; Desire for Improvement   Sleep  Sleep: Sleep: Fair Number of Hours of Sleep: 8       Physical Exam: General: Sitting comfortably. NAD. HEENT: Normocephalic, atraumatic, MMM, EMOI Lungs: no increased work of breathing noted Heart: no cyanosis Abdomen: Non distended Musculoskeletal: FROM. No obvious deformities Skin: Warm, dry, intact. No rashes noted Neuro: No obvious focal deficits.  Gait and station are normal  Review of Systems  Constitutional: Negative.   HENT: Negative.    Eyes: Negative.   Respiratory: Negative.    Cardiovascular: Negative.   Gastrointestinal: Negative.   Genitourinary: Negative.   Skin: Negative.   Neurological: Negative.   Psychiatric/Behavioral:  Positive for depression.     Blood pressure 103/65, pulse 70, temperature 98 F (36.7 C), temperature source Oral, resp. rate 18, height 4\' 11"  (1.499 m), weight 63 kg, SpO2 99%. Body mass index is 28.05 kg/m.   Treatment Plan Summary: Daily contact with patient to assess and evaluate symptoms and progress in treatment and Medication management.    No change at all.  Waiting on placement. No medication side effects reported.  Because of urinary incontinence, recommended adult diapers to be worn.   Principal/active diagnoses:  Schizoaffective disorder, depressive type (HCC)   Active Problems: GERD (gastroesophageal reflux disease) Intellectual  disability Tobacco use disorder (MOCA 16/30) moderate cognitive impairment probably  related to moderate intellectual disability.  Plan: No change in medications. -Continue Imitrex 25 mg PRN BID for migraines -Continue Colace 100 mg daily for constipation -Continue Vitamin D 50.000 units weekly for bone health. -Continue Sertraline150 mg po Q daily for depression/anxiety -Continue Buspar 15 mg po bid for anxiety.  -Continue paliperidone 6 mg po qd for mood stabilization -Continue Trazodone 50 mg nightly for insomnia  -Continue Nicoderm 21 mg topically Q 24 hrs for nicotine withdrawal management. -Continue vitamin B12 1000 mcg IM weekly for 4 weeks then monthly -Continue Melatonin 5 mg nightly for sleep -Continue Protonix EC 40 mg p.o. daily for GERD -Restart MiraLAX 17 g p.o. PRN for constipation. -Continue hydroxyzine 25 mg p.o. 3 times daily as needed for anxiety -Continue Ensure nutritional shakes TID in between meals  -Previously discontinued Hydroxyzine 50 mg nightly-hypotension in the mornings  -Imodium for diarrhea as needed. Safety and Monitoring: Voluntary admission to inpatient psychiatric unit for safety, stabilization and treatment Daily contact with patient to assess and evaluate symptoms and progress in treatment Patient's case to be discussed in multi-disciplinary team meeting Observation Level : q15 minute checks Vital signs: q12 hours Precautions: Safety   Discharge Planning: Social work and case management to assist with discharge planning and identification of hospital follow-up needs prior to discharge Estimated LOS: Unknown at this time. Discharge Concerns: Need to establish a safety plan; Medication compliance and effectiveness Discharge Goals: Return home with outpatient referrals for mental health follow-up including medication management/psychotherapy Apparently patient now has a legal guardian.  Placement is in progress.  The patient may not have the option  to go to the mother's house unless there is 24/7 supervision.  [name], MD 06/05/2023, 12:03 PM Patient ID: Yolanda [name], female   DOB: [birth year], 47 y.o.   MRN: [id number]

## 2023-06-06 DIAGNOSIS — F251 Schizoaffective disorder, depressive type: Secondary | ICD-10-CM | POA: Diagnosis not present

## 2023-06-06 NOTE — Progress Notes (Signed)
[facility] MD Progress Note  06/06/2023 10:35 AM Yolanda [name]  MRN:  [id number] Subjective: Per nursing report, remains cooperative and pleasant and compliant with medications.  No side effects are noted.  Mood is stable.  The patient was seen today she was alert oriented and cooperative.  She continues to report that she is pretty much at her baseline and denies any psychosis or delusions.  She denies any auditory or visual hallucinations.  She denies SI/HI.  She is trying to make a case for her to go and stay with her mother.  Unfortunately her mother does not work full-time and it may be unlikely if to find some home health or caregiver to take care of her when her mother is not around.  However it is possible that some services may be available during the daytime and this may be an alternative to her going to an assisted living facility. Contract for safety.   Principal Problem: Schizoaffective disorder, depressive type (HCC) Diagnosis: Principal Problem:   Schizoaffective disorder, depressive type (HCC) Active Problems:   GERD (gastroesophageal reflux disease)   Intellectual disability   Tobacco use disorder  Total Time spent with patient: 15 minutes  Past Psychiatric History: see H&P  Past Medical History:  Past Medical History:  Diagnosis Date   Bipolar affect, depressed (HCC)    Constipation 08/17/2022   Depression    Falls 07/21/2022   Fracture of femoral neck, right, closed (HCC) 01/17/2022   Herpes simplex 08/22/2017   Open fracture dislocation of right elbow joint 01/17/2022    Past Surgical History:  Procedure Laterality Date   NO PAST SURGERIES     SALPINGECTOMY     Family History: History reviewed. No pertinent family history. Family Psychiatric  History: see H&P Social History:  Social History   Substance and Sexual Activity  Alcohol Use Yes     Social History   Substance and Sexual Activity  Drug Use Yes   Types: Cocaine, Marijuana    Social History    Socioeconomic History   Marital status: Single    Spouse name: Not on file   Number of children: Not on file   Years of education: Not on file   Highest education level: Not on file  Occupational History   Not on file  Tobacco Use   Smoking status: Every Day   Smokeless tobacco: Not on file  Substance and Sexual Activity   Alcohol use: Yes   Drug use: Yes    Types: Cocaine, Marijuana   Sexual activity: Yes  Other Topics Concern   Not on file  Social History Narrative   Not on file   Social Determinants of Health   Financial Resource Strain: Low Risk  (09/12/2022)   Received from [facility], [facility]   Overall Financial Resource Strain (CARDIA)    Difficulty of Paying Living Expenses: Not hard at all  Food Insecurity: Patient Declined (12/20/2022)   Hunger Vital Sign    Worried About Running Out of Food in the Last Year: Patient declined    Ran Out of Food in the Last Year: Patient declined  Transportation Needs: No Transportation Needs (12/20/2022)   PRAPARE - Administrator, Civil Service (Medical): No    Lack of Transportation (Non-Medical): No  Physical Activity: Not on file  Stress: No Stress Concern Present (07/17/2022)   Received from [facility], [facility] of Occupational Health - Occupational Stress Questionnaire    Feeling of  Stress : Not at all  Social Connections: Unknown (07/16/2022)   Received from [facility], [facility]   Social Network    Social Network: Not on file   Additional Social History:                         Sleep: Good  Appetite:  Poor  Current Medications: Current Facility-Administered Medications  Medication Dose Route Frequency Provider Last Rate Last Admin   acetaminophen (TYLENOL) tablet 650 mg  650 mg Oral Q6H PRN [name], NP   650 mg at 06/02/23 2137   alum & mag hydroxide-simeth (MAALOX/MYLANTA) 200-200-20 MG/5ML suspension 30 mL  30 mL Oral Q4H PRN Onuoha,  Chinwendu V, NP   30 mL at 05/31/23 0801   busPIRone (BUSPAR) tablet 15 mg  15 mg Oral BID [name], NP   15 mg at 06/06/23 0801   cyanocobalamin (VITAMIN B12) injection 1,000 mcg  1,000 mcg Intramuscular Q30 days Sarita Bottom, MD   1,000 mcg at 06/02/23 1129   diphenhydrAMINE (BENADRYL) capsule 50 mg  50 mg Oral TID PRN [name], NP   50 mg at 06/03/23 1110   Or   diphenhydrAMINE (BENADRYL) injection 50 mg  50 mg Intramuscular TID PRN [name], NP       docusate sodium (COLACE) capsule 100 mg  100 mg Oral Daily [name], NP   100 mg at 06/06/23 0801   feeding supplement (ENSURE ENLIVE / ENSURE PLUS) liquid 237 mL  237 mL Oral TID BM [name], NP   237 mL at 06/06/23 1610   haloperidol (HALDOL) tablet 5 mg  5 mg Oral TID PRN [name], NP   5 mg at 06/03/23 1110   Or   haloperidol lactate (HALDOL) injection 5 mg  5 mg Intramuscular TID PRN [name], NP       hydrOXYzine (ATARAX) tablet 25 mg  25 mg Oral TID PRN [name], DO   25 mg at 05/27/23 1146   LORazepam (ATIVAN) tablet 2 mg  2 mg Oral TID PRN [name], NP   2 mg at 06/03/23 1110   Or   LORazepam (ATIVAN) injection 2 mg  2 mg Intramuscular TID PRN [name], NP       magnesium hydroxide (MILK OF MAGNESIA) suspension 30 mL  30 mL Oral Daily PRN [name], NP   30 mL at 04/13/23 0814   melatonin tablet 5 mg  5 mg Oral QHS Nkwenti, Doris, NP   5 mg at 06/05/23 2116   nicotine polacrilex (NICORETTE) gum 2 mg  2 mg Oral PRN [name], MD   2 mg at 06/04/23 1626   paliperidone (INVEGA) 24 hr tablet 6 mg  6 mg Oral Daily [name], NP   6 mg at 06/05/23 2116   pantoprazole (PROTONIX) EC tablet 40 mg  40 mg Oral Daily Nwoko, Nicole Kindred I, NP   40 mg at 06/06/23 0801   polyethylene glycol (MIRALAX / GLYCOLAX) packet 17 g  17 g Oral Daily PRN [name], MD   17 g at 06/04/23 1624   polyethylene glycol (MIRALAX / GLYCOLAX) packet 17 g  17 g Oral Daily [name], MD   17 g  at 06/06/23 0804   sertraline (ZOLOFT) tablet 150 mg  150 mg Oral Daily Massengill, Harrold Donath, MD   150 mg at 06/06/23 0801   SUMAtriptan (IMITREX) tablet 25 mg  25 mg Oral BID  PRN [name], NP   25 mg at 05/07/23 2112   traZODone (DESYREL) tablet 50 mg  50 mg Oral QHS [name], NP   50 mg at 06/05/23 2116   Vitamin D (Ergocalciferol) (DRISDOL) 1.25 MG (50000 UNIT) capsule 50,000 Units  50,000 Units Oral Q7 days [name], NP   50,000 Units at 06/01/23 1404    Lab Results:  No results found for this or any previous visit (from the past 48 hour(s)).   Blood Alcohol level:  Lab Results  Component Value Date   ETH <10 12/19/2022   ETH <10 11/21/2019    Metabolic Disorder Labs: Lab Results  Component Value Date   HGBA1C 4.4 (L) 12/21/2022   MPG 79.58 12/21/2022   No results found for: "PROLACTIN" Lab Results  Component Value Date   CHOL 218 (H) 12/21/2022   TRIG 81 12/21/2022   HDL 53 12/21/2022   CHOLHDL 4.1 12/21/2022   VLDL 16 12/21/2022   LDLCALC 149 (H) 12/21/2022   LDLCALC 110 (H) 03/13/2011    Physical Findings: AIMS: Facial and Oral Movements Muscles of Facial Expression: None Lips and Perioral Area: None Jaw: None Tongue: None,Extremity Movements Upper (arms, wrists, hands, fingers): None Lower (legs, knees, ankles, toes): None, Trunk Movements Neck, shoulders, hips: None, Global Judgements Severity of abnormal movements overall : None Incapacitation due to abnormal movements: None Patient's awareness of abnormal movements: No Awareness, Dental Status Current problems with teeth and/or dentures?: No Does patient usually wear dentures?: No  CIWA:    COWS:     Musculoskeletal: Strength & Muscle Tone: within normal limits Gait & Station: normal Patient leans: N/A  Psychiatric Specialty Exam:  Presentation  General Appearance:  Casual  Eye Contact: Fair  Speech: Clear and Coherent  Speech  Volume: Normal  Handedness: Right   Mood and Affect  Mood: Anxious  Affect: Restricted   Thought Process  Thought Processes: Coherent  Descriptions of Associations:Intact  Orientation:Full (Time, Place and Person)  Thought Content:Rumination  History of Schizophrenia/Schizoaffective disorder:Yes  Duration of Psychotic Symptoms:Greater than six months  Hallucinations:Hallucinations: None     Ideas of Reference:None  Suicidal Thoughts:Suicidal Thoughts: No     Homicidal Thoughts:Homicidal Thoughts: No      Sensorium  Memory: Immediate Fair; Remote Fair; Recent Fair  Judgment: Fair  Insight: Fair   Art therapist  Concentration: Fair  Attention Span: Fair  Recall: [employer] of Knowledge: Fair  Language: Fair   Psychomotor Activity  Psychomotor Activity: Psychomotor Activity: Normal      Assets  Assets: Communication Skills; Desire for Improvement   Sleep  Sleep: Sleep: Fair Number of Hours of Sleep: 8       Physical Exam: General: Sitting comfortably. NAD. HEENT: Normocephalic, atraumatic, MMM, EMOI Lungs: no increased work of breathing noted Heart: no cyanosis Abdomen: Non distended Musculoskeletal: FROM. No obvious deformities Skin: Warm, dry, intact. No rashes noted Neuro: No obvious focal deficits.  Gait and station are normal  Review of Systems  Constitutional: Negative.   HENT: Negative.    Eyes: Negative.   Respiratory: Negative.    Cardiovascular: Negative.   Gastrointestinal: Negative.   Genitourinary: Negative.   Skin: Negative.   Neurological: Negative.   Psychiatric/Behavioral:  Positive for depression.     Blood pressure 101/84, pulse 71, temperature 98 F (36.7 C), temperature source Oral, resp. rate 18, height 4\' 11"  (1.499 m), weight 63 kg, SpO2 98%. Body mass index is 28.05 kg/m.   Treatment Plan Summary: Daily  contact with patient to assess and evaluate symptoms and  progress in treatment and Medication management.    No change at all.  Waiting on placement. No medication side effects reported.  Because of urinary incontinence, recommended adult diapers to be worn.   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  Plan: No change in medications. -Continue Imitrex 25 mg PRN BID for migraines -Continue Colace 100 mg daily for constipation -Continue Vitamin D 50.000 units weekly for bone health. -Continue Sertraline150 mg po Q daily for depression/anxiety -Continue Buspar 15 mg po bid for anxiety.  -Continue paliperidone 6 mg po qd for mood stabilization -Continue Trazodone 50 mg nightly for insomnia  -Continue Nicoderm 21 mg topically Q 24 hrs for nicotine withdrawal management. -Continue vitamin B12 1000 mcg IM weekly for 4 weeks then monthly -Continue Melatonin 5 mg nightly for sleep -Continue Protonix EC 40 mg p.o. daily for GERD -Restart MiraLAX 17 g p.o. PRN for constipation. -Continue hydroxyzine 25 mg p.o. 3 times daily as needed for anxiety -Continue Ensure nutritional shakes TID in between meals  -Previously discontinued Hydroxyzine 50 mg nightly-hypotension in the mornings  -Imodium for diarrhea as needed. Safety and Monitoring: Voluntary admission to inpatient psychiatric unit for safety, stabilization and treatment Daily contact with patient to assess and evaluate symptoms and progress in treatment Patient's case to be discussed in multi-disciplinary team meeting Observation Level : q15 minute checks Vital signs: q12 hours Precautions: Safety   Discharge Planning: Social work and case management to assist with discharge planning and identification of hospital follow-up needs prior to  discharge Estimated LOS: Unknown at this time. Discharge Concerns: Need to establish a safety plan; Medication compliance and effectiveness Discharge Goals: Return home with outpatient referrals for mental health follow-up including medication management/psychotherapy Apparently patient now has a legal guardian.  Placement is in progress.  The patient may not have the option to go to the mother's house unless there is 24/7 supervision.  [name], MD 06/06/2023, 10:35 AM Patient ID: Yolanda [name], female   DOB: Apr 21, 1976, 47 y.o.   MRN: [id number]

## 2023-06-06 NOTE — Progress Notes (Addendum)
D. Pt presents with a sad affect, depressed mood -rated her depression, hopelessness and anxiety a 9/10/5, respectively. [name] stated goal is "to work on getting my GED".  Pt has been visible in the milieu throughout the shift. Pt currently denies SI/HI and AVH   A. Labs and vitals monitored. Pt given and educated on medications. Pt supported emotionally and encouraged to express concerns and ask questions.   R. Pt remains safe with 15 minute checks. Will continue POC.    06/06/23 0900  Psych Admission Type (Psych Patients Only)  Admission Status Involuntary  Psychosocial Assessment  Patient Complaints None  Eye Contact Fair  Facial Expression Anxious  Affect Appropriate to circumstance  Speech Logical/coherent  Interaction Childlike  Motor Activity Slow  Appearance/Hygiene Disheveled  Behavior Characteristics Cooperative  Mood Depressed;Anxious  Aggressive Behavior  Effect No apparent injury  Thought Process  Coherency WDL  Content WDL  Delusions WDL  Perception WDL  Hallucination None reported or observed  Judgment WDL  Confusion WDL  Danger to Self  Current suicidal ideation? Denies  Danger to Others  Danger to Others None reported or observed

## 2023-06-06 NOTE — Group Note (Signed)
[facility] LCSW Group Therapy Note  Date/Time: 06/06/2023 at 11:00AM - 12:00PM  Type of Therapy/Topic:  Group Therapy:  Journey and Not the Destination  Participation Level:  Patient walked into group at the very end, however was not able to participate as group was ending. No concerns to report.

## 2023-06-06 NOTE — Group Note (Signed)
Date:  06/06/2023 Time:  3:13 PM  Group Topic/Focus:  Identifying Needs:   The focus of this group is to help patients identify their personal needs that have been historically problematic and identify healthy behaviors to address their needs.    Participation Level:  Minimal  Participation Quality:  Appropriate  Affect:  Appropriate  Cognitive:  Appropriate  Insight: Appropriate  Engagement in Group:  Developing/Improving  Modes of Intervention:  Discussion and Education  Additional Comments:    [name] 06/06/2023, 3:13 PM

## 2023-06-06 NOTE — Plan of Care (Signed)
Problem: Coping: Goal: Coping ability will improve Outcome: Progressing Goal: Will verbalize feelings Outcome: Progressing

## 2023-06-06 NOTE — [facility] Group Notes (Signed)
[facility] Group Notes:  (Nursing/MHT/Case Management/Adjunct)  Date:  06/06/2023  Time:  9:17 PM  Type of Therapy:   Wrap-up group  Participation Level:  Active  Participation Quality:  Appropriate  Affect:  Appropriate  Cognitive:  Appropriate  Insight:  Appropriate  Engagement in Group:  Engaged  Modes of Intervention:  Education  Summary of Progress/Problems: Goal to attend groups. Rated day 9/10.  Noah Delaine 06/06/2023, 9:17 PM

## 2023-06-07 ENCOUNTER — Encounter (HOSPITAL_COMMUNITY): Payer: Self-pay

## 2023-06-07 DIAGNOSIS — F251 Schizoaffective disorder, depressive type: Secondary | ICD-10-CM | POA: Diagnosis not present

## 2023-06-07 NOTE — Progress Notes (Signed)
Patient ID: Yolanda [name], female   DOB: Aug 23, 1975, 47 y.o.   MRN: [id number] Pt presents with depressed mood,affect flat. Yolanda [name] denies any acute concerns, stating '' I'm fine. '' But forwards little. She has been isolative to the room thus far but denies any acute concerns. Pt remains boarding for placement issues. Pt is safe, denies any SI HI or AV hallucinations at this time.

## 2023-06-07 NOTE — Plan of Care (Signed)

## 2023-06-07 NOTE — Group Note (Signed)
Date:  06/07/2023 Time:  10:57 PM  Group Topic/Focus:  Wrap-Up Group:   The focus of this group is to help patients review their daily goal of treatment and discuss progress on daily workbooks.    Participation Level:  Minimal  Participation Quality:  Appropriate  Affect:  Appropriate  Cognitive:  Appropriate  Insight: Improving  Engagement in Group:  Engaged  Modes of Intervention:  Socialization  Additional Comments:  Pt attended the evening wrap-up group. Tech introduced the staff for the evening, reminded group of the evening schedule and reminded them to ask for anything they need. PT worked on socialization increased knowledge of coping skills through an interactive activity.  Osa Craver 06/07/2023, 10:57 PM

## 2023-06-07 NOTE — Plan of Care (Signed)
  Problem: Activity: Goal: Interest or engagement in leisure activities will improve Outcome: Progressing   Problem: Coping: Goal: Will verbalize feelings Outcome: Progressing   Problem: Health Behavior/Discharge Planning: Goal: Compliance with therapeutic regimen will improve Outcome: Progressing   Problem: Safety: Goal: Ability to disclose and discuss suicidal ideas will improve Outcome: Progressing

## 2023-06-07 NOTE — BH IP Treatment Plan (Signed)
Interdisciplinary Treatment and Diagnostic Plan Update  06/07/2023 Time of Session: 12:00 pm Yolanda [name] MRN: [id number]  Principal Diagnosis: Schizoaffective disorder, depressive type (HCC)  Secondary Diagnoses: Principal Problem:   Schizoaffective disorder, depressive type (HCC) Active Problems:   GERD (gastroesophageal reflux disease)   Intellectual disability   Tobacco use disorder   Current Medications:  Current Facility-Administered Medications  Medication Dose Route Frequency Provider Last Rate Last Admin   acetaminophen (TYLENOL) tablet 650 mg  650 mg Oral Q6H PRN [name], NP   650 mg at 06/06/23 2122   alum & mag hydroxide-simeth (MAALOX/MYLANTA) 200-200-20 MG/5ML suspension 30 mL  30 mL Oral Q4H PRN Onuoha, Chinwendu V, NP   30 mL at 05/31/23 0801   busPIRone (BUSPAR) tablet 15 mg  15 mg Oral BID [name], NP   15 mg at 06/07/23 0751   cyanocobalamin (VITAMIN B12) injection 1,000 mcg  1,000 mcg Intramuscular Q30 days Sarita Bottom, MD   1,000 mcg at 06/02/23 1129   diphenhydrAMINE (BENADRYL) capsule 50 mg  50 mg Oral TID PRN [name], NP   50 mg at 06/03/23 1110   Or   diphenhydrAMINE (BENADRYL) injection 50 mg  50 mg Intramuscular TID PRN [name], NP       docusate sodium (COLACE) capsule 100 mg  100 mg Oral Daily [name], NP   100 mg at 06/07/23 0751   feeding supplement (ENSURE ENLIVE / ENSURE PLUS) liquid 237 mL  237 mL Oral TID BM [name], NP   237 mL at 06/06/23 2123   haloperidol (HALDOL) tablet 5 mg  5 mg Oral TID PRN [name], NP   5 mg at 06/03/23 1110   Or   haloperidol lactate (HALDOL) injection 5 mg  5 mg Intramuscular TID PRN [name], NP       hydrOXYzine (ATARAX) tablet 25 mg  25 mg Oral TID PRN [name], DO   25 mg at 05/27/23 1146   LORazepam (ATIVAN) tablet 2 mg  2 mg Oral TID PRN [name], NP   2 mg at 06/03/23 1110   Or   LORazepam (ATIVAN) injection 2 mg  2 mg Intramuscular TID PRN  [name], NP       magnesium hydroxide (MILK OF MAGNESIA) suspension 30 mL  30 mL Oral Daily PRN [name], NP   30 mL at 04/13/23 0814   melatonin tablet 5 mg  5 mg Oral QHS Nkwenti, [place] Deleo, NP   5 mg at 06/06/23 2123   nicotine polacrilex (NICORETTE) gum 2 mg  2 mg Oral PRN [name], MD   2 mg at 06/06/23 1627   paliperidone (INVEGA) 24 hr tablet 6 mg  6 mg Oral Daily [name], NP   6 mg at 06/06/23 2123   pantoprazole (PROTONIX) EC tablet 40 mg  40 mg Oral Daily Nwoko, Nicole Kindred I, NP   40 mg at 06/07/23 0751   polyethylene glycol (MIRALAX / GLYCOLAX) packet 17 g  17 g Oral Daily PRN [name], MD   17 g at 06/04/23 1624   polyethylene glycol (MIRALAX / GLYCOLAX) packet 17 g  17 g Oral Daily [name], MD   17 g at 06/06/23 0804   sertraline (ZOLOFT) tablet 150 mg  150 mg Oral Daily Massengill, Harrold Donath, MD   150 mg at 06/07/23 0751   SUMAtriptan (IMITREX) tablet 25 mg  25 mg Oral BID PRN [name], NP   25 mg at  05/07/23 2112   traZODone (DESYREL) tablet 50 mg  50 mg Oral QHS [name], NP   50 mg at 06/06/23 2123   Vitamin D (Ergocalciferol) (DRISDOL) 1.25 MG (50000 UNIT) capsule 50,000 Units  50,000 Units Oral Q7 days [name], NP   50,000 Units at 06/01/23 1404   PTA Medications: Medications Prior to Admission  Medication Sig Dispense Refill Last Dose   busPIRone (BUSPAR) 15 MG tablet Take 15 mg by mouth 2 (two) times daily. (Patient not taking: Reported on 12/19/2022)      paliperidone (INVEGA SUSTENNA) 156 MG/ML SUSY injection Inject 156 mg into the muscle once. (Patient not taking: Reported on 12/19/2022)      sertraline (ZOLOFT) 50 MG tablet Take 150 mg by mouth daily. (Patient not taking: Reported on 12/19/2022)      traZODone (DESYREL) 100 MG tablet Take 100 mg by mouth at bedtime as needed for sleep. 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   Physician Treatment Plan for Primary Diagnosis: Schizoaffective disorder, depressive type (HCC) Long Term Goal(s): Improvement in symptoms so as ready for discharge   Short Term Goals: Ability to identify and develop effective coping behaviors will improve Ability to maintain clinical measurements within normal limits will improve Compliance with prescribed medications will improve Ability to identify triggers associated with substance abuse/mental health issues will improve Ability to identify changes in lifestyle to reduce recurrence of condition will improve Ability to verbalize feelings will improve Ability to disclose and discuss suicidal ideas Ability to demonstrate self-control will improve  Medication Management: Evaluate patient's response, side effects, and tolerance of medication regimen.  Therapeutic Interventions: 1 to 1 sessions, Unit Group sessions and Medication administration.  Evaluation of Outcomes: Progressing  Physician Treatment Plan for Secondary Diagnosis: Principal Problem:   Schizoaffective disorder, depressive type (HCC) Active Problems:   GERD (gastroesophageal reflux disease)   Intellectual disability   Tobacco use disorder  Long Term Goal(s): Improvement in symptoms so as ready for discharge   Short Term Goals: Ability to identify and develop effective coping behaviors will improve Ability to maintain clinical measurements within normal limits will improve Compliance with prescribed medications will improve Ability to identify triggers associated with substance abuse/mental health issues will improve Ability to identify changes in lifestyle to reduce recurrence of condition will improve Ability to verbalize feelings will improve Ability to disclose and discuss  suicidal ideas Ability to demonstrate self-control will improve     Medication Management: Evaluate patient's response, side effects, and tolerance of medication regimen.  Therapeutic Interventions: 1 to 1 sessions, Unit Group sessions and Medication administration.  Evaluation of Outcomes: Progressing   RN Treatment Plan for Primary Diagnosis: Schizoaffective disorder, depressive type (HCC) Long Term Goal(s): Knowledge of disease and therapeutic regimen to maintain health will improve  Short Term Goals: Ability to remain free from injury will improve, Ability to demonstrate self-control, Ability to verbalize feelings will improve, and Ability to identify and develop effective coping behaviors will improve  Medication Management: RN will administer medications as ordered by provider, will assess and evaluate patient's response and provide education to patient for prescribed medication. RN will report any adverse and/or side effects to prescribing provider.  Therapeutic Interventions: 1 on 1 counseling sessions, Psychoeducation, Medication  administration, Evaluate responses to treatment, Monitor vital signs and CBGs as ordered, Perform/monitor CIWA, COWS, AIMS and Fall Risk screenings as ordered, Perform wound care treatments as ordered.  Evaluation of Outcomes: Progressing   LCSW Treatment Plan for Primary Diagnosis: Schizoaffective disorder, depressive type (HCC) Long Term Goal(s): Safe transition to appropriate next level of care at discharge, Engage patient in therapeutic group addressing interpersonal concerns.  Short Term Goals: Engage patient in aftercare planning with referrals and resources, Increase ability to appropriately verbalize feelings, and Facilitate acceptance of mental health diagnosis and concerns  Therapeutic Interventions: Assess for all discharge needs, 1 to 1 time with Social worker, Explore available resources and support systems, Assess for adequacy in community  support network, Educate family and significant other(s) on suicide prevention, Complete Psychosocial Assessment, Interpersonal group therapy.  Evaluation of Outcomes: Progressing   Progress in Treatment:  Attending groups: Yes. Participating in groups: Yes. Taking medication as prescribed: Yes. Toleration medication: Yes. Family/Significant other contact made: Yes, individual(s) contacted:  Virna Chaudhari [phone] Patient understands diagnosis: Yes. Discussing patient identified problems/goals with staff: Yes. Medical problems stabilized or resolved: Yes. Denies suicidal/homicidal ideation: Yes. Issues/concerns per patient self-inventory: No. Other: none reported  New problem(s) identified: none reported  New Short Term/Long Term Goal(s): medication stabilization, elimination of SI thoughts, development of comprehensive mental wellness plan.    Patient Goals:  " placement"  Discharge Plan or Barriers: Patient recently admitted. CSW will continue to follow and assess for appropriate referrals and possible discharge planning.    Reason for Continuation of Hospitalization: Depression Suicidal ideation  Estimated Length of Stay: 7-10 days  Last 3 [place] Suicide Severity Risk Score: Flowsheet Row Ad[facility] (Current) [facility] from [facility]19/[facility] in [facility] from 11/12/2022 in [facility] Emergency Department at [facility]  C-SSRS RISK CATEGORY High Risk High Risk Moderate Risk       Last [place] 2/9 Scores:     No data to display          Scribe for Treatment Team: Kathrynn Humble 06/07/2023 1:54 PM

## 2023-06-07 NOTE — Progress Notes (Signed)
   06/07/23 2045  Psych Admission Type (Psych Patients Only)  Admission Status Involuntary  Psychosocial Assessment  Patient Complaints None  Eye Contact Fair  Facial Expression Anxious  Affect Appropriate to circumstance  Speech Logical/coherent  Interaction Cautious;Childlike  Motor Activity Slow  Appearance/Hygiene Disheveled  Behavior Characteristics Cooperative  Mood Depressed  Aggressive Behavior  Effect No apparent injury  Thought Process  Coherency WDL  Content WDL  Delusions WDL  Perception WDL  Hallucination None reported or observed  Judgment WDL  Confusion WDL  Danger to Self  Current suicidal ideation? Denies  Danger to Others  Danger to Others None reported or observed

## 2023-06-07 NOTE — Progress Notes (Signed)
LCSW went and spoke with patient on today to assess current mental state.  Patient reports she is doing well at this time.  Patient reports she has been speaking with her mother and daughters regarding her not "going into the state". LCSW asked patient to elaborate, and patient stated "my family does not want me in social services".  LCSW asked patient regarding 2 daughters.  Patient reports she has one 49 year old daughter who has a 65-year-old son, and a 72 year old daughter who lives in the home with her mother.  Patient reports having a good relationship with both children and grandson. Patient reports having recent visitation from family that went well. Patient reports her oldest daughter works with children with autism, and her youngest daughter is a Lawyer and does make up.  Patient reports her mother works for a Chief [employer], and reports her hours vary from day to day.  LCSW explored the patient's thoughts regarding returning home with family.  Patient reports she does not believe her mother will allow this to happen, as she is scared that the patient will come back and hurt herself.  LCSW asked when the last time patient has cut herself, and patient stated "years ago".  Patient reports she would like to be with her mother, as she "has her own room and is able to smoke cigarettes". Patient reports she smokes about a pack per day because it relaxes her.  Patient denied any other substance use.  LCSW explored other hobbies that the patient has interest in, and patient reports she loves to color and do word searches.  Patient reports she is also studying material to get her GED.  When asked regarding her motivations to live, patient reports her children, grandson, and mother are her reasons to live.  Patient reports having on and off days occasionally. Patient reports when she hears voices she results to reading her Bible to help with the voices. Patient reports when she is having off days she typically takes a  shower and reads her Bible.  LCSW asked the patient how she would safety plan if she was to return home with her mother.  Patient reports she would "hang out with her friend, complete word searches/color, and smoke a cigarette to relax".   LCSW spoke with patient regarding the importance of good personal hygiene, and patient reports she is unsure why she has problems going to the bathroom now. Patient reports "she usually tries to get up to use the bathroom, however when she does she is already wet".  When asked regarding how long this has been going on, patient reports "I do not know".   LCSW explored the patient's interest at discharge, and the patient reported that she would like to go to a "staff member's group home".  LCSW informed the patient that this will likely not happen, as this would be a conflict of interest. Patient dropped her head, however was encouraged via motivational interviewing regarding possible next steps towards recovery. Patient reports if she went home, she would want someone to come in the home to help her. Patient then reported "like a CNA". Patient reports she believes her mother would be okay with that option. LCSW explored if patient receives any income per month.  Patient reports she receives about $981 a month for disability.  Patient reports having access to her card here at the hospital.  Patient reports she also has a payee by the name of Bernardo Heater with [employer]. Patient  reports she has not touched any of the funds, and likely has a couple thousand dollars available on her card.  Patient has provided permission for LCSW to follow up with her mother and daughters as needed.  Contact information provided is as follows: Oldest daughter Majel Homer: [phone], youngest daughter [name]: [phone], and mother Cosma Pollino: [phone].  Patient aware that LCSW will follow up with her mother on today, and will follow up with her on Monday to provide  updates.  Patient expressed appreciation for LCSW conversation.  No concerns to report at this time.

## 2023-06-07 NOTE — Plan of Care (Signed)
  Problem: Coping: Goal: Coping ability will improve Outcome: Progressing Goal: Will verbalize feelings Outcome: Progressing   

## 2023-06-07 NOTE — Group Note (Signed)
Recreation Therapy Group Note   Group Topic:Problem Solving  Group Date: 06/07/2023 Start Time: 0930 End Time: 1000 Facilitators: Antinette Keough-McCall, LRT,CTRS Location: [address] Dayroom   Group Topic: Problem Solving  Goal Area(s) Addresses:  Patient will effectively work in a team with other group members. Patient will verbalize importance of using appropriate problem solving techniques.  Patient will identify positive change associated with effective problem solving skills.   Intervention: Worksheets, Pencils  Group Description: Dentist. Patients were given two sheets of brain teasers. Patients were given 20 minutes to try and figure out as many of teasers they could. Patients were also allowed to work together if they chose to. Once patients finished, LRT would go over the answers with the patients.    Education Outcome: Acknowledges understanding/In group clarification offered/Needs additional education.    Clinical Observations/Individualized Feedback: Due to previous group going over/exceeding time, recreation therapy group was unable to be held at scheduled time.     Plan: Continue to engage patient in RT group sessions 2-3x/week.   Vikas Wegmann-McCall, LRT,CTRS  06/07/2023 1:20 PM

## 2023-06-07 NOTE — [facility] Suicide Risk Assessment (Signed)
[facility] INPATIENT:  Family/Significant Other Suicide Prevention Education  Suicide Prevention Education:  Education Completed; Patient's mother: Yolanda [name] [phone],  (name of family member/significant other) has been identified by the patient as the family member/significant other with whom the patient will be residing, and identified as the person(s) who will aid the patient in the event of a mental health crisis (suicidal ideations/suicide attempt).  With written consent from the patient, the family member/significant other has been provided the following suicide prevention education, prior to the and/or following the discharge of the patient.  The suicide prevention education provided includes the following: Suicide risk factors Suicide prevention and interventions National Suicide Hotline telephone number [facility] assessment telephone number [facility] Emergency Assistance 911 [facility] and/or Residential Mobile Crisis Unit telephone number  Request made of family/significant other to: Remove weapons (e.g., guns, rifles, knives), all items previously/currently identified as safety concern.   Remove drugs/medications (over-the-counter, prescriptions, illicit drugs), all items previously/currently identified as a safety concern.  LCSW followed up with patient's mother Yolanda [name] [phone] to discuss disposition plans.  LCSW informed mother that LCSW is following up to touch basis regarding information provided to team, and to safety plan. LCSW explored mother's interest of patient returning back into the home if additional services are in place. Mother immediately stated that the patient returning back into her home is not an option at this time. Mother reports she would prefer for the patient to be in an environment that can support her and monitor her medications, as she is not able to provide around the clock care for the patient. Mother reports the patient has  had multiple suicide attempts in the past with the last [place]. Mother reports the patient has an extensive history of SI and self-injurious behaviors,  and reports "I cannot continue to live my life worried about her every single second". Mother reports there has been times where the patient has been in the home, and when she return from home, she would have to clean blood off the floor because the patient has been cutting herself. Mother reports there has also been an incident where she came home and found two water bottles and all of her medications spread around the bathroom floor as an attempt to overdose on her medications. Mother reports "it does not matter if you are in the home or out of the home, Jontavia is going to do what she wants to do". Mother reports due to the patient being a high safety risk, she is not willing to allow her back in the home at this time. LCSW informed Mother that the patient is decompensating being here in the hospital, and that this is not the most appropriate solution for the patient long-term. Mother agreed however stated, "I would really have to have a conversation with my granddaughters to see what we could come up with, but for now it is a no". LCSW explored the option of seeking caregivers to come in the home for additional support, and mother reports "I am not interested in no one coming into my home". Patient reports she lives a two bedroom home with the patient's daughter, and reports she knows Yolanda [name] will do the exact same thing that she has always done because she has been dealing with this for years. Mother reports the patient would even get to the point of calling the cops numerous of times stating she is hearing voices and her medication is  not working so that they could take her to the hospital. LCSW explored other options that the family has tried regarding supporting the patient. Mother reports when the patient left the  hospital in [facility], the patient was scheduled to go to a group home where she would reside and the family could visit. Mother reports the patient did well for a little while and then would not abide by the rules and was removed from the home. Mother reports the patient would constantly leave the group home stove on and attempt to smoke the cigarette butts from the other residents by lighting it on the stove. She was given kitchen restrictions that she did not abide by and had to be removed. Mother reports she has even tried a Environmental manager for the patient about a year ago, and reports they got through the entire process and was about to get her started and then the patient declined services. Mother reports she has tried to support the patient, however she believes the patient needs a higher level of care at this time. Mother confirmed that the patient receives about $900+ in disability per month, and confirmed payee from [employer]. Mother reports due to her schedule she is not able to support the patient at this time, however she will have a conversation with her granddaughters to see what they can come up with. LCSW informed mother that LCSW will follow with her on Monday regarding updates to work collaboratively regarding a discharge plan. No other needs to report at this time from mother.   Yolanda [name] 06/07/2023, 5:57 PM

## 2023-06-07 NOTE — Progress Notes (Signed)
[facility] MD Progress Note  06/07/2023 11:11 AM Yolanda [name]  MRN:  [id number]  Principal Problem: Schizoaffective disorder, depressive type (HCC) Diagnosis: Principal Problem:   Schizoaffective disorder, depressive type (HCC) Active Problems:   GERD (gastroesophageal reflux disease)   Intellectual disability   Tobacco use disorder  Patient is a  47y.o. female with h/o Schizoaffective disorder, who presents to the [facility] unit due to suicidal ideations with a plan to stab self.   Interval History Patient was seen today for re-evaluation.  Nursing reports no events overnight. The patient has no issues with performing ADLs.  Patient has been medication compliant.    Patient was seen and interviewed by attending psychiatrist. Chart reviewed. Patient discussed during treatment team rounds.  Subjective:  On assessment patient reports "doing good". She reports good mood, denies feeling depressed, anxious, panicky. He reports good sleep and appetite. She denies any symptoms of psychosis - denies auditory or visual hallucinations, denies feeling paranoid, unsafe, does not express any delusions. She denies thoughts or plans of hurting self or others. He reports no side effects from medications she is getting here. She denies any physical complaints.  Labs: no new results for review.  Total Time spent with patient: 20 minutes  Past Psychiatric History: see H&P  Past Medical History:  Past Medical History:  Diagnosis Date   Bipolar affect, depressed (HCC)    Constipation 08/17/2022   Depression    Falls 07/21/2022   Fracture of femoral neck, right, closed (HCC) 01/17/2022   Herpes simplex 08/22/2017   Open fracture dislocation of right elbow joint 01/17/2022    Past Surgical History:  Procedure Laterality Date   NO PAST SURGERIES     SALPINGECTOMY     Family History: History reviewed. No pertinent family history. Family Psychiatric  History: see H&P Social History:  Social History    Substance and Sexual Activity  Alcohol Use Yes     Social History   Substance and Sexual Activity  Drug Use Yes   Types: Cocaine, Marijuana    Social History   Socioeconomic History   Marital status: Single    Spouse name: Not on file   Number of children: Not on file   Years of education: Not on file   Highest education level: Not on file  Occupational History   Not on file  Tobacco Use   Smoking status: Every Day   Smokeless tobacco: Not on file  Substance and Sexual Activity   Alcohol use: Yes   Drug use: Yes    Types: Cocaine, Marijuana   Sexual activity: Yes  Other Topics Concern   Not on file  Social History Narrative   Not on file   Social Determinants of Health   Financial Resource Strain: Low Risk  (09/12/2022)   Received from [facility], [facility]   Overall Financial Resource Strain (CARDIA)    Difficulty of Paying Living Expenses: Not hard at all  Food Insecurity: Patient Declined (12/20/2022)   Hunger Vital Sign    Worried About Running Out of Food in the Last Year: Patient declined    Ran Out of Food in the Last Year: Patient declined  Transportation Needs: No Transportation Needs (12/20/2022)   PRAPARE - Administrator, Civil Service (Medical): No    Lack of Transportation (Non-Medical): No  Physical Activity: Not on file  Stress: No Stress Concern Present (07/17/2022)   Received from [facility]   [facility] of Occupational Health -  Occupational Stress Questionnaire    Feeling of Stress : Not at all  Social Connections: Unknown (07/16/2022)   Received from [facility]   Social Network    Social Network: Not on file   Additional Social History:                         Sleep: Good  Appetite:  Good  Current Medications: Current Facility-Administered Medications  Medication Dose Route Frequency Provider Last Rate Last Admin   acetaminophen (TYLENOL) tablet 650 mg  650 mg Oral  Q6H PRN [name], NP   650 mg at 06/06/23 2122   alum & mag hydroxide-simeth (MAALOX/MYLANTA) 200-200-20 MG/5ML suspension 30 mL  30 mL Oral Q4H PRN Onuoha, Chinwendu V, NP   30 mL at 05/31/23 0801   busPIRone (BUSPAR) tablet 15 mg  15 mg Oral BID [name], NP   15 mg at 06/07/23 0751   cyanocobalamin (VITAMIN B12) injection 1,000 mcg  1,000 mcg Intramuscular Q30 days Sarita Bottom, MD   1,000 mcg at 06/02/23 1129   diphenhydrAMINE (BENADRYL) capsule 50 mg  50 mg Oral TID PRN [name], NP   50 mg at 06/03/23 1110   Or   diphenhydrAMINE (BENADRYL) injection 50 mg  50 mg Intramuscular TID PRN [name], NP       docusate sodium (COLACE) capsule 100 mg  100 mg Oral Daily [name], NP   100 mg at 06/07/23 0751   feeding supplement (ENSURE ENLIVE / ENSURE PLUS) liquid 237 mL  237 mL Oral TID BM [name], NP   237 mL at 06/06/23 2123   haloperidol (HALDOL) tablet 5 mg  5 mg Oral TID PRN [name], NP   5 mg at 06/03/23 1110   Or   haloperidol lactate (HALDOL) injection 5 mg  5 mg Intramuscular TID PRN [name], NP       hydrOXYzine (ATARAX) tablet 25 mg  25 mg Oral TID PRN [name], DO   25 mg at 05/27/23 1146   LORazepam (ATIVAN) tablet 2 mg  2 mg Oral TID PRN [name], NP   2 mg at 06/03/23 1110   Or   LORazepam (ATIVAN) injection 2 mg  2 mg Intramuscular TID PRN [name], NP       magnesium hydroxide (MILK OF MAGNESIA) suspension 30 mL  30 mL Oral Daily PRN [name], NP   30 mL at 04/13/23 0814   melatonin tablet 5 mg  5 mg Oral QHS Nkwenti, Doris, NP   5 mg at 06/06/23 2123   nicotine polacrilex (NICORETTE) gum 2 mg  2 mg Oral PRN [name], MD   2 mg at 06/06/23 1627   paliperidone (INVEGA) 24 hr tablet 6 mg  6 mg Oral Daily [name], NP   6 mg at 06/06/23 2123   pantoprazole (PROTONIX) EC tablet 40 mg  40 mg Oral Daily Nwoko, Nicole Kindred I, NP   40 mg at 06/07/23 0751   polyethylene glycol (MIRALAX / GLYCOLAX) packet 17 g  17  g Oral Daily PRN [name], MD   17 g at 06/04/23 1624   polyethylene glycol (MIRALAX / GLYCOLAX) packet 17 g  17 g Oral Daily [name], MD   17 g at 06/06/23 0804   sertraline (ZOLOFT) tablet 150 mg  150 mg Oral Daily Massengill, Harrold Donath, MD   150 mg at 06/07/23 0751   SUMAtriptan (IMITREX)  tablet 25 mg  25 mg Oral BID PRN [name], NP   25 mg at 05/07/23 2112   traZODone (DESYREL) tablet 50 mg  50 mg Oral QHS [name], NP   50 mg at 06/06/23 2123   Vitamin D (Ergocalciferol) (DRISDOL) 1.25 MG (50000 UNIT) capsule 50,000 Units  50,000 Units Oral Q7 days [name], NP   50,000 Units at 06/01/23 1404    Lab Results: No results found for this or any previous visit (from the past 48 hour(s)).  Blood Alcohol level:  Lab Results  Component Value Date   ETH <10 12/19/2022   ETH <10 11/21/2019    Metabolic Disorder Labs: Lab Results  Component Value Date   HGBA1C 4.4 (L) 12/21/2022   MPG 79.58 12/21/2022   No results found for: "PROLACTIN" Lab Results  Component Value Date   CHOL 218 (H) 12/21/2022   TRIG 81 12/21/2022   HDL 53 12/21/2022   CHOLHDL 4.1 12/21/2022   VLDL 16 12/21/2022   LDLCALC 149 (H) 12/21/2022   LDLCALC 110 (H) 03/13/2011    Physical Findings: AIMS: Facial and Oral Movements Muscles of Facial Expression: None Lips and Perioral Area: None Jaw: None Tongue: None,Extremity Movements Upper (arms, wrists, hands, fingers): None Lower (legs, knees, ankles, toes): None, Trunk Movements Neck, shoulders, hips: None, Global Judgements Severity of abnormal movements overall : None Incapacitation due to abnormal movements: None Patient's awareness of abnormal movements: No Awareness, Dental Status Current problems with teeth and/or dentures?: No Does patient usually wear dentures?: No  CIWA:    COWS:     Musculoskeletal: Strength & Muscle Tone: within normal limits Gait & Station: normal Patient leans: N/A  Psychiatric Specialty  Exam:  Appearance:  AAF, appearing stated age,  wearing appropriate to the situation casual/hospital clothes, with fair grooming and hygiene. Normal level of alertness and appropriate facial expression.  Attitude/Behavior: calm, cooperative, engaging with appropriate eye contact.  Motor: WNL; dyskinesias not evident. Gait appears in full range.  Speech: spontaneous, clear, coherent, normal comprehension.  Mood: euthymic, " doing good".  Affect: appropriately-reactive.  Thought process: patient appears coherent, organized, goal-directed, concrete but linear with questions  Thought content: patient denies suicidal thoughts, denies homicidal thoughts; did not express any delusions.  Thought perception: patient denies auditory and visual hallucinations. Did not appear internally stimulated.  Cognition: patient is alert and oriented in self, place, date; with intact attention and concentration.  Insight: limited, in regards of understanding of presence, nature, cause, and significance of mental or emotional problem.  Judgement: limited, in regards of ability to make good decisions concerning the appropriate thing to do in various situations, including ability to form opinions regarding their mental health condition.  Psychomotor Activity  Psychomotor Activity:No data recorded  Assets  Assets: Communication Skills; Desire for Improvement   Sleep  Sleep:No data recorded   Physical Exam: Physical Exam ROS Blood pressure 117/78, pulse 77, temperature 98 F (36.7 C), temperature source Oral, resp. rate 18, height 4\' 11"  (1.499 m), weight 63 kg, SpO2 98%. Body mass index is 28.05 kg/m.   Treatment Plan Summary: Daily contact with patient to assess and evaluate symptoms and progress in treatment and Medication management  Patient is a 47 year old female with the above-stated past psychiatric history who is seen in follow-up.  Chart reviewed. Patient discussed with  nursing. Patient appears at her mental baseline. No changes in medicine made today.  Diagnoses/ Active problems: -Schizoaffective disorder -Intellectual disability   PLAN:  Safety and Monitoring:  continue inpatient psych admission; 15-minute checks; daily contact with patient to assess and evaluate symptoms and progress in treatment; psychoeducation.Vital signs: q12 hours. Precautions: suicide, elopement, and assault. Placed on room lock out for meals, snacks and groups.  Psychiatric Problems: -Continue Sertraline150 mg po Q daily for depression/anxiety -Continue Buspar 15 mg po bid for anxiety.  -Continue paliperidone 6 mg po qd for mood stabilization -Continue Trazodone 50 mg nightly for insomnia   - Metabolic profile and EKG monitoring obtained while on an atypical antipsychotic (BMI: Lipid Panel: HbgA1c: QTc:)   - Encouraged patient to participate in unit milieu and in scheduled group therapies   Medical Problems: -Continue Imitrex 25 mg PRN BID for migraines -Continue Colace 100 mg daily for constipation -Continue Vitamin D 50.000 units weekly for bone health. -Continue Nicoderm 21 mg topically Q 24 hrs for nicotine withdrawal management. -Continue vitamin B12 1000 mcg IM weekly for 4 weeks then monthly -Continue Melatonin 5 mg nightly for sleep -Continue Protonix EC 40 mg p.o. daily for GERD -Continue MiraLAX 17 g p.o. PRN for constipation. -Continue hydroxyzine 25 mg p.o. 3 times daily as needed for anxiety -Continue Ensure nutritional shakes TID in between meals  -Previously discontinued Hydroxyzine 50 mg nightly-hypotension in the mornings  -Imodium for diarrhea as needed.  PRN medications:  acetaminophen, alum & mag hydroxide-simeth, diphenhydrAMINE **OR** diphenhydrAMINE, haloperidol **OR** haloperidol lactate, hydrOXYzine, LORazepam **OR** LORazepam, magnesium hydroxide, nicotine polacrilex, polyethylene glycol, SUMAtriptan  Pertinent Labs: no new labs ordered  today  Consults: No new consults placed since yesterday    Discharge Planning: -Social work and case management to assist with discharge planning and identification of hospital follow-up needs prior to discharge -Estimated LOS: Unknown at this time -Discharge Concerns: Need to establish a safety plan; Medication compliance and effectiveness -Discharge Goals: Return home with outpatient referrals for mental health follow-up including medication management/psychotherapy Apparently patient now has a legal guardian. Placement is in progress. The patient may not have the option to go to the mother's house unless there is 24/7 supervision.   Total Time Spent in Direct Patient Care:  I personally spent 35 minutes on the unit in direct patient care. The direct patient care time included face-to-face time with the patient, reviewing the patient's chart, communicating with other professionals, and coordinating care. Greater than 50% of this time was spent in counseling or coordinating care with the patient regarding goals of hospitalization, psycho-education, and discharge planning needs.   Thalia Party, MD 06/07/2023, 11:11 AM

## 2023-06-08 DIAGNOSIS — F251 Schizoaffective disorder, depressive type: Secondary | ICD-10-CM | POA: Diagnosis not present

## 2023-06-08 NOTE — Progress Notes (Signed)
[facility] MD Progress Note  06/08/2023 8:10 AM Yolanda [name]  MRN:  [id number]  Principal Problem: Schizoaffective disorder, depressive type (HCC) Diagnosis: Principal Problem:   Schizoaffective disorder, depressive type (HCC) Active Problems:   GERD (gastroesophageal reflux disease)   Intellectual disability   Tobacco use disorder  Patient is a  47y.o. female with h/o Schizoaffective disorder, who presents to the [facility] unit due to suicidal ideations with a plan to stab self.   Interval History Patient was seen today for re-evaluation.  Nursing reports no events overnight. The patient has no issues with performing ADLs.  Patient has been medication compliant.    Patient was seen and interviewed by attending psychiatrist. Chart reviewed. Patient discussed during treatment team rounds.  Subjective:  On assessment patient reports "doing good. I have no suicidal thoughts". She continues to report good mood and sleep and denies any mental or physical complaints today. Patient denies feeling depressed, anxious. She denies any symptoms of psychosis - denies auditory or visual hallucinations, denies feeling paranoid, unsafe, does not express any delusions. She denies thoughts or plans of hurting self or others. He reports no side effects from medications she is getting here.   Labs: no new results for review.  Total Time spent with patient: 20 minutes  Past Psychiatric History: see H&P  Past Medical History:  Past Medical History:  Diagnosis Date   Bipolar affect, depressed (HCC)    Constipation 08/17/2022   Depression    Falls 07/21/2022   Fracture of femoral neck, right, closed (HCC) 01/17/2022   Herpes simplex 08/22/2017   Open fracture dislocation of right elbow joint 01/17/2022    Past Surgical History:  Procedure Laterality Date   NO PAST SURGERIES     SALPINGECTOMY     Family History: History reviewed. No pertinent family history. Family Psychiatric  History: see H&P Social  History:  Social History   Substance and Sexual Activity  Alcohol Use Yes     Social History   Substance and Sexual Activity  Drug Use Yes   Types: Cocaine, Marijuana    Social History   Socioeconomic History   Marital status: Single    Spouse name: Not on file   Number of children: Not on file   Years of education: Not on file   Highest education level: Not on file  Occupational History   Not on file  Tobacco Use   Smoking status: Every Day   Smokeless tobacco: Not on file  Substance and Sexual Activity   Alcohol use: Yes   Drug use: Yes    Types: Cocaine, Marijuana   Sexual activity: Yes  Other Topics Concern   Not on file  Social History Narrative   Not on file   Social Determinants of Health   Financial Resource Strain: Low Risk  (09/12/2022)   Received from [facility], [facility]   Overall Financial Resource Strain (CARDIA)    Difficulty of Paying Living Expenses: Not hard at all  Food Insecurity: Patient Declined (12/20/2022)   Hunger Vital Sign    Worried About Running Out of Food in the [name] Year: Patient declined    Ran Out of Food in the [name] Year: Patient declined  Transportation Needs: No Transportation Needs (12/20/2022)   PRAPARE - Administrator, Civil Service (Medical): No    Lack of Transportation (Non-Medical): No  Physical Activity: Not on file  Stress: No Stress Concern Present (07/17/2022)   Received from [facility], [facility]  [employer] of Occupational Health - Occupational Stress Questionnaire    Feeling of Stress : Not at all  Social Connections: Unknown (07/16/2022)   Received from [employer], [facility]   Social Network    Social Network: Not on file   Additional Social History:                         Sleep: Good  Appetite:  Good  Current Medications: Current Facility-Administered Medications  Medication Dose Route Frequency Provider [name] Rate [name] Admin   acetaminophen (TYLENOL)  tablet 650 mg  650 mg Oral Q6H PRN [name], NP   650 mg at 06/07/23 1809   alum & mag hydroxide-simeth (MAALOX/MYLANTA) 200-200-20 MG/5ML suspension 30 mL  30 mL Oral Q4H PRN Onuoha, Chinwendu V, NP   30 mL at 05/31/23 0801   busPIRone (BUSPAR) tablet 15 mg  15 mg Oral BID [name], NP   15 mg at 06/08/23 1610   cyanocobalamin (VITAMIN B12) injection 1,000 mcg  1,000 mcg Intramuscular Q30 days Sarita Bottom, MD   1,000 mcg at 06/02/23 1129   diphenhydrAMINE (BENADRYL) capsule 50 mg  50 mg Oral TID PRN [name], NP   50 mg at 06/03/23 1110   Or   diphenhydrAMINE (BENADRYL) injection 50 mg  50 mg Intramuscular TID PRN [name], NP       docusate sodium (COLACE) capsule 100 mg  100 mg Oral Daily [name], NP   100 mg at 06/08/23 9604   feeding supplement (ENSURE ENLIVE / ENSURE PLUS) liquid 237 mL  237 mL Oral TID BM [name], NP   237 mL at 06/07/23 2115   haloperidol (HALDOL) tablet 5 mg  5 mg Oral TID PRN [name], NP   5 mg at 06/03/23 1110   Or   haloperidol lactate (HALDOL) injection 5 mg  5 mg Intramuscular TID PRN [name], NP       hydrOXYzine (ATARAX) tablet 25 mg  25 mg Oral TID PRN [name], DO   25 mg at 05/27/23 1146   LORazepam (ATIVAN) tablet 2 mg  2 mg Oral TID PRN [name], NP   2 mg at 06/03/23 1110   Or   LORazepam (ATIVAN) injection 2 mg  2 mg Intramuscular TID PRN [name], NP       magnesium hydroxide (MILK OF MAGNESIA) suspension 30 mL  30 mL Oral Daily PRN [name], NP   30 mL at 04/13/23 0814   melatonin tablet 5 mg  5 mg Oral QHS Nkwenti, Doris, NP   5 mg at 06/07/23 2115   nicotine polacrilex (NICORETTE) gum 2 mg  2 mg Oral PRN [name], MD   2 mg at 06/07/23 1403   paliperidone (INVEGA) 24 hr tablet 6 mg  6 mg Oral Daily [name], NP   6 mg at 06/07/23 2115   pantoprazole (PROTONIX) EC tablet 40 mg  40 mg Oral Daily Nwoko, Nicole Kindred I, NP   40 mg at 06/08/23 0752   polyethylene glycol (MIRALAX /  GLYCOLAX) packet 17 g  17 g Oral Daily PRN [name], MD   17 g at 06/07/23 1847   polyethylene glycol (MIRALAX / GLYCOLAX) packet 17 g  17 g Oral Daily [name], MD   17 g at 06/06/23 0804   sertraline (ZOLOFT) tablet 150 mg  150 mg Oral Daily Massengill, Harrold Donath, MD   150 mg at  06/08/23 0752   SUMAtriptan (IMITREX) tablet 25 mg  25 mg Oral BID PRN [name], NP   25 mg at 05/07/23 2112   traZODone (DESYREL) tablet 50 mg  50 mg Oral QHS [name], NP   50 mg at 06/07/23 2115   Vitamin D (Ergocalciferol) (DRISDOL) 1.25 MG (50000 UNIT) capsule 50,000 Units  50,000 Units Oral Q7 days [name], NP   50,000 Units at 06/01/23 1404    Lab Results: No results found for this or any previous visit (from the past 48 hour(s)).  Blood Alcohol level:  Lab Results  Component Value Date   ETH <10 12/19/2022   ETH <10 11/21/2019    Metabolic Disorder Labs: Lab Results  Component Value Date   HGBA1C 4.4 (L) 12/21/2022   MPG 79.58 12/21/2022   No results found for: "PROLACTIN" Lab Results  Component Value Date   CHOL 218 (H) 12/21/2022   TRIG 81 12/21/2022   HDL 53 12/21/2022   CHOLHDL 4.1 12/21/2022   VLDL 16 12/21/2022   LDLCALC 149 (H) 12/21/2022   LDLCALC 110 (H) 03/13/2011    Physical Findings: AIMS: Facial and Oral Movements Muscles of Facial Expression: None Lips and Perioral Area: None Jaw: None Tongue: None,Extremity Movements Upper (arms, wrists, hands, fingers): None Lower (legs, knees, ankles, toes): None, Trunk Movements Neck, shoulders, hips: None, Global Judgements Severity of abnormal movements overall : None Incapacitation due to abnormal movements: None Patient's awareness of abnormal movements: No Awareness, Dental Status Current problems with teeth and/or dentures?: No Does patient usually wear dentures?: No  CIWA:    COWS:     Musculoskeletal: Strength & Muscle Tone: within normal limits Gait & Station: normal Patient leans:  N/A  Psychiatric Specialty Exam:  Appearance:  AAF, appearing stated age,  wearing appropriate to the situation casual/hospital clothes, with fair grooming and hygiene. Normal level of alertness and appropriate facial expression.  Attitude/Behavior: calm, cooperative, engaging with appropriate eye contact.  Motor: WNL; dyskinesias not evident. Gait appears in full range.  Speech: spontaneous, clear, coherent, normal comprehension.  Mood: euthymic, " everything is good".  Affect: appropriately-reactive.  Thought process: patient appears coherent, organized, goal-directed, concrete but linear with questions  Thought content: patient denies suicidal thoughts, denies homicidal thoughts; did not express any delusions.  Thought perception: patient denies auditory and visual hallucinations. Did not appear internally stimulated.  Cognition: patient is alert and oriented in self, place, date; with intact attention and concentration.  Insight: limited, in regards of understanding of presence, nature, cause, and significance of mental or emotional problem.  Judgement: limited, in regards of ability to make good decisions concerning the appropriate thing to do in various situations, including ability to form opinions regarding their mental health condition.  Psychomotor Activity  Psychomotor Activity:No data recorded  Assets  Assets: Communication Skills; Desire for Improvement   Sleep  Sleep:No data recorded   Physical Exam: Physical Exam ROS Blood pressure 103/88, pulse 86, temperature 97.7 F (36.5 C), temperature source Oral, resp. rate 18, height 4\' 11"  (1.499 m), weight 63 kg, SpO2 97%. Body mass index is 28.05 kg/m.   Treatment Plan Summary: Daily contact with patient to assess and evaluate symptoms and progress in treatment and Medication management  Patient is a 47 year old female with the above-stated past psychiatric history who is seen in follow-up.  Chart reviewed.  Patient discussed with nursing. Patient appears at her mental baseline. No changes in medicine made today.  Diagnoses/ Active problems: -Schizoaffective disorder -Intellectual disability  PLAN:  Safety and Monitoring: continue inpatient psych admission; 15-minute checks; daily contact with patient to assess and evaluate symptoms and progress in treatment; psychoeducation.Vital signs: q12 hours. Precautions: suicide, elopement, and assault. Placed on room lock out for meals, snacks and groups.  Psychiatric Problems: -Continue Sertraline150 mg po Q daily for depression/anxiety -Continue Buspar 15 mg po bid for anxiety.  -Continue paliperidone 6 mg po qd for mood stabilization -Continue Trazodone 50 mg nightly for insomnia   - Metabolic profile and EKG monitoring obtained while on an atypical antipsychotic (BMI: Lipid Panel: HbgA1c: QTc:)   - Encouraged patient to participate in unit milieu and in scheduled group therapies   Medical Problems: -Continue Imitrex 25 mg PRN BID for migraines -Continue Colace 100 mg daily for constipation -Continue Vitamin D 50.000 units weekly for bone health. -Continue Nicoderm 21 mg topically Q 24 hrs for nicotine withdrawal management. -Continue vitamin B12 1000 mcg IM weekly for 4 weeks then monthly -Continue Melatonin 5 mg nightly for sleep -Continue Protonix EC 40 mg p.o. daily for GERD -Continue MiraLAX 17 g p.o. PRN for constipation. -Continue hydroxyzine 25 mg p.o. 3 times daily as needed for anxiety -Continue Ensure nutritional shakes TID in between meals  -Previously discontinued Hydroxyzine 50 mg nightly-hypotension in the mornings  -Imodium for diarrhea as needed.  PRN medications:  acetaminophen, alum & mag hydroxide-simeth, diphenhydrAMINE **OR** diphenhydrAMINE, haloperidol **OR** haloperidol lactate, hydrOXYzine, LORazepam **OR** LORazepam, magnesium hydroxide, nicotine polacrilex, polyethylene glycol, SUMAtriptan  Pertinent Labs: no  new labs ordered today  Consults: No new consults placed since yesterday    Discharge Planning: -Social work and case management to assist with discharge planning and identification of hospital follow-up needs prior to discharge -Estimated LOS: Unknown at this time -Discharge Concerns: Need to establish a safety plan; Medication compliance and effectiveness -Discharge Goals: Return home with outpatient referrals for mental health follow-up including medication management/psychotherapy Apparently patient now has a legal guardian. Placement is in progress. The patient may not have the option to go to the mother's house unless there is 24/7 supervision.   Total Time Spent in Direct Patient Care:  I personally spent 35 minutes on the unit in direct patient care. The direct patient care time included face-to-face time with the patient, reviewing the patient's chart, communicating with other professionals, and coordinating care. Greater than 50% of this time was spent in counseling or coordinating care with the patient regarding goals of hospitalization, psycho-education, and discharge planning needs.   Thalia Party, MD 06/08/2023, 8:10 AM

## 2023-06-08 NOTE — [facility] Group Notes (Signed)
[facility] Group Notes:  (Nursing/MHT/Case Management/Adjunct)  Date:  06/08/2023  Time:  2:36 PM  Type of Therapy:  Psychoeducational Skills  Participation Level:  Did Not Attend  Participation Quality:   na  Affect:    Cognitive:    Insight:    Engagement in Group:    Modes of Intervention:    Summary of Progress/Problems: Psychoeducational group in which patients were educated on the impact of negative thinking, positive reframing and mindfulness. Pt did not attend despite enocuragement.  Malva Limes 06/08/2023, 2:36 PM

## 2023-06-08 NOTE — Group Note (Signed)
Date:  06/08/2023 Time:  9:47 PM  Group Topic/Focus:  Wrap-Up Group:   The focus of this group is to help patients review their daily goal of treatment and discuss progress on daily workbooks.    Participation Level:  Did Not Attend  Participation Quality:   N/A  Affect:   N/A  Cognitive:   N/A  Insight: None  Engagement in Group:   N/A  Modes of Intervention:   N/A  Additional Comments:  Patient did not attend wrap up group.   Kennieth Francois 06/08/2023, 9:47 PM

## 2023-06-08 NOTE — Plan of Care (Signed)

## 2023-06-08 NOTE — Group Note (Signed)
LCSW Group Therapy Note  Group Date: 06/08/2023 Start Time: 1000 End Time: 1100   Type of Therapy and Topic:  Group Therapy - Healthy vs Unhealthy Coping Skills  Participation Level:  Did Not Attend   Description of Group The focus of this group was to determine what unhealthy coping techniques typically are used by group members and what healthy coping techniques would be helpful in coping with various problems. Patients were guided in becoming aware of the differences between healthy and unhealthy coping techniques. Patients were asked to identify 2-3 healthy coping skills they would like to learn to use more effectively.  Therapeutic Goals Patients learned that coping is what human beings do all day long to deal with various situations in their lives Patients defined and discussed healthy vs unhealthy coping techniques Patients identified their preferred coping techniques and identified whether these were healthy or unhealthy Patients determined 2-3 healthy coping skills they would like to become more familiar with and use more often. Patients provided support and ideas to each other   Summary of Patient Progress:  Patient was invited to group, did not attend.    Therapeutic Modalities Cognitive Behavioral Therapy Motivational Interviewing  Lynnell Chad, Theresia Majors 06/08/2023  11:15 AM

## 2023-06-08 NOTE — Progress Notes (Signed)
Patient ID: Yolanda [name], female   DOB: January 17, 1976, 47 y.o.   MRN: [id number] Pt presents with depressed mood, affect congruent. Yolanda [name] continues to become tearful at times on the unit, last night becoming very upset near shift change related to her length of stay. She states '' I just feel like I'll be stuck here forever, it feels like nothing is getting done. '' She became visibly upset and overwhelmed, allowed pt to ventilate and support given.  This am, spoke with patient and she forwards little, sharing that she has no concerns. She continues to be isolative in the am and not attending breakfast or groups. She remains boarding for placement. The above was discussed at length with treatment team including MD, NP and SW.  Emotional support provided for patient at length and encouraged pt to come out of room more and avoid isolation.  Pt has accepted medications without issue. Pt is safe, will con't to monitor.

## 2023-06-08 NOTE — Progress Notes (Signed)
D) Pt received calm, visible, participating in milieu, and in no acute distress. Pt A & O x4. Pt denies SI, HI, A/ V H at this time. Pt reports sadness, anxiety, and pain in her l great toe. Pt attn seeking at times but redirectable.A) Pt encouraged to drink fluids. Pt encouraged to come to staff with needs. Pt encouraged to attend and participate in groups. Pt encouraged to set reachable goals.  R) Pt remained safe on unit, in no acute distress, will continue to assess.     06/08/23 2000  Psych Admission Type (Psych Patients Only)  Admission Status Involuntary  Psychosocial Assessment  Patient Complaints Depression;Sadness  Eye Contact Fair  Facial Expression Anxious  Affect Flat  Speech Soft  Interaction Childlike  Motor Activity Slow  Appearance/Hygiene Disheveled  Behavior Characteristics Cooperative  Mood Depressed  Thought Process  Coherency WDL  Content WDL  Delusions WDL  Perception WDL  Hallucination None reported or observed  Judgment WDL  Confusion WDL  Danger to Self  Current suicidal ideation? Denies  Agreement Not to Harm Self Yes  Description of Agreement verbal  Danger to Others  Danger to Others None reported or observed

## 2023-06-09 DIAGNOSIS — F251 Schizoaffective disorder, depressive type: Secondary | ICD-10-CM | POA: Diagnosis not present

## 2023-06-09 NOTE — Progress Notes (Signed)
[facility] MD Progress Note  06/09/2023 9:47 AM Yolanda [name]  MRN:  [id number]  Principal Problem: Schizoaffective disorder, depressive type (HCC) Diagnosis: Principal Problem:   Schizoaffective disorder, depressive type (HCC) Active Problems:   GERD (gastroesophageal reflux disease)   Intellectual disability   Tobacco use disorder  Patient is a  47y.o. female with h/o Schizoaffective disorder, who presents to the [facility] unit due to suicidal ideations with a plan to stab self.   Interval History Patient was seen today for re-evaluation.  Nursing reports no events overnight. The patient has no issues with performing ADLs.  Patient has been medication compliant.    Patient was seen and interviewed by attending psychiatrist. Chart reviewed. Patient discussed during treatment team rounds.  Subjective:  On assessment patient reports "I am okay". She continues to deny any mental or physical complaints today. Patient denies feeling depressed, anxious. She denies any symptoms of psychosis such as auditory or visual hallucinations, denies feeling paranoid, unsafe, does not express any delusions. She denies thoughts or plans of hurting self or others. He reports no side effects from medications she is getting here. She is upset due to being here for a very long time and hopes for the some discharge plan soon.  Labs: no new results for review.  Total Time spent with patient: 20 minutes  Past Psychiatric History: see H&P  Past Medical History:  Past Medical History:  Diagnosis Date   Bipolar affect, depressed (HCC)    Constipation 08/17/2022   Depression    Falls 07/21/2022   Fracture of femoral neck, right, closed (HCC) 01/17/2022   Herpes simplex 08/22/2017   Open fracture dislocation of right elbow joint 01/17/2022    Past Surgical History:  Procedure Laterality Date   NO PAST SURGERIES     SALPINGECTOMY     Family History: History reviewed. No pertinent family history. Family  Psychiatric  History: see H&P Social History:  Social History   Substance and Sexual Activity  Alcohol Use Yes     Social History   Substance and Sexual Activity  Drug Use Yes   Types: Cocaine, Marijuana    Social History   Socioeconomic History   Marital status: Single    Spouse name: Not on file   Number of children: Not on file   Years of education: Not on file   Highest education level: Not on file  Occupational History   Not on file  Tobacco Use   Smoking status: Every Day   Smokeless tobacco: Not on file  Substance and Sexual Activity   Alcohol use: Yes   Drug use: Yes    Types: Cocaine, Marijuana   Sexual activity: Yes  Other Topics Concern   Not on file  Social History Narrative   Not on file   Social Determinants of Health   Financial Resource Strain: Low Risk  (09/12/2022)   Received from [facility]   Overall Financial Resource Strain (CARDIA)    Difficulty of Paying Living Expenses: Not hard at all  Food Insecurity: Patient Declined (12/20/2022)   Hunger Vital Sign    Worried About Running Out of Food in the Last Year: Patient declined    Ran Out of Food in the Last Year: Patient declined  Transportation Needs: No Transportation Needs (12/20/2022)   PRAPARE - Administrator, Civil Service (Medical): No    Lack of Transportation (Non-Medical): No  Physical Activity: Not on file  Stress: No Stress Concern Present (07/17/2022)  Received from [employer], [facility]   [employer] of Occupational Health - Occupational Stress Questionnaire    Feeling of Stress : Not at all  Social Connections: Unknown (07/16/2022)   Received from [facility], [facility]   Social Network    Social Network: Not on file   Additional Social History:                         Sleep: Good  Appetite:  Good  Current Medications: Current Facility-Administered Medications  Medication Dose Route Frequency Provider Last Rate  Last Admin   acetaminophen (TYLENOL) tablet 650 mg  650 mg Oral Q6H PRN [name], NP   650 mg at 06/08/23 2017   alum & mag hydroxide-simeth (MAALOX/MYLANTA) 200-200-20 MG/5ML suspension 30 mL  30 mL Oral Q4H PRN Onuoha, Chinwendu V, NP   30 mL at 05/31/23 0801   busPIRone (BUSPAR) tablet 15 mg  15 mg Oral BID [name], NP   15 mg at 06/09/23 0900   cyanocobalamin (VITAMIN B12) injection 1,000 mcg  1,000 mcg Intramuscular Q30 days Sarita Bottom, MD   1,000 mcg at 06/02/23 1129   diphenhydrAMINE (BENADRYL) capsule 50 mg  50 mg Oral TID PRN [name], NP   50 mg at 06/03/23 1110   Or   diphenhydrAMINE (BENADRYL) injection 50 mg  50 mg Intramuscular TID PRN [name], NP       docusate sodium (COLACE) capsule 100 mg  100 mg Oral Daily Nkwenti, Tyler Aas, NP   100 mg at 06/09/23 0900   feeding supplement (ENSURE ENLIVE / ENSURE PLUS) liquid 237 mL  237 mL Oral TID BM [name], NP   237 mL at 06/09/23 0947   haloperidol (HALDOL) tablet 5 mg  5 mg Oral TID PRN [name], NP   5 mg at 06/03/23 1110   Or   haloperidol lactate (HALDOL) injection 5 mg  5 mg Intramuscular TID PRN [name], NP       hydrOXYzine (ATARAX) tablet 25 mg  25 mg Oral TID PRN [name], DO   25 mg at 05/27/23 1146   LORazepam (ATIVAN) tablet 2 mg  2 mg Oral TID PRN [name], NP   2 mg at 06/03/23 1110   Or   LORazepam (ATIVAN) injection 2 mg  2 mg Intramuscular TID PRN [name], NP       magnesium hydroxide (MILK OF MAGNESIA) suspension 30 mL  30 mL Oral Daily PRN [name], NP   30 mL at 04/13/23 0814   melatonin tablet 5 mg  5 mg Oral QHS Nkwenti, Doris, NP   5 mg at 06/08/23 2106   nicotine polacrilex (NICORETTE) gum 2 mg  2 mg Oral PRN [name], MD   2 mg at 06/07/23 1403   paliperidone (INVEGA) 24 hr tablet 6 mg  6 mg Oral Daily [name], NP   6 mg at 06/08/23 2107   pantoprazole (PROTONIX) EC tablet 40 mg  40 mg Oral Daily Nwoko, Nicole Kindred I, NP   40 mg at 06/09/23  0900   polyethylene glycol (MIRALAX / GLYCOLAX) packet 17 g  17 g Oral Daily PRN [name], MD   17 g at 06/07/23 1847   polyethylene glycol (MIRALAX / GLYCOLAX) packet 17 g  17 g Oral Daily [name], MD   17 g at 06/09/23 0900   sertraline (ZOLOFT) tablet 150 mg  150 mg Oral Daily  Phineas Inches, MD   150 mg at 06/09/23 0900   SUMAtriptan (IMITREX) tablet 25 mg  25 mg Oral BID PRN [name], NP   25 mg at 05/07/23 2112   traZODone (DESYREL) tablet 50 mg  50 mg Oral QHS [name], NP   50 mg at 06/08/23 2107   Vitamin D (Ergocalciferol) (DRISDOL) 1.25 MG (50000 UNIT) capsule 50,000 Units  50,000 Units Oral Q7 days [name], NP   50,000 Units at 06/08/23 1620    Lab Results: No results found for this or any previous visit (from the past 48 hour(s)).  Blood Alcohol level:  Lab Results  Component Value Date   ETH <10 12/19/2022   ETH <10 11/21/2019    Metabolic Disorder Labs: Lab Results  Component Value Date   HGBA1C 4.4 (L) 12/21/2022   MPG 79.58 12/21/2022   No results found for: "PROLACTIN" Lab Results  Component Value Date   CHOL 218 (H) 12/21/2022   TRIG 81 12/21/2022   HDL 53 12/21/2022   CHOLHDL 4.1 12/21/2022   VLDL 16 12/21/2022   LDLCALC 149 (H) 12/21/2022   LDLCALC 110 (H) 03/13/2011    Physical Findings: AIMS: Facial and Oral Movements Muscles of Facial Expression: None Lips and Perioral Area: None Jaw: None Tongue: None,Extremity Movements Upper (arms, wrists, hands, fingers): None Lower (legs, knees, ankles, toes): None, Trunk Movements Neck, shoulders, hips: None, Global Judgements Severity of abnormal movements overall : None Incapacitation due to abnormal movements: None Patient's awareness of abnormal movements: No Awareness, Dental Status Current problems with teeth and/or dentures?: No Does patient usually wear dentures?: No  CIWA:    COWS:     Musculoskeletal: Strength & Muscle Tone: within normal limits Gait  & Station: normal Patient leans: N/A  Psychiatric Specialty Exam:  Appearance:  AAF, appearing stated age,  wearing appropriate to the situation casual/hospital clothes, with fair grooming and hygiene. Normal level of alertness and appropriate facial expression.  Attitude/Behavior: calm, cooperative, engaging with appropriate eye contact.  Motor: WNL; dyskinesias not evident. Gait appears in full range.  Speech: spontaneous, clear, coherent, normal comprehension.  Mood: euthymic, "okay".  Affect: appropriately-reactive.  Thought process: patient appears coherent, organized, goal-directed, concrete but linear with questions  Thought content: patient denies suicidal thoughts, denies homicidal thoughts; did not express any delusions.  Thought perception: patient denies auditory and visual hallucinations. Did not appear internally stimulated.  Cognition: patient is alert and oriented in self, place, date; with intact attention and concentration.  Insight: limited, in regards of understanding of presence, nature, cause, and significance of mental or emotional problem.  Judgement: limited, in regards of ability to make good decisions concerning the appropriate thing to do in various situations, including ability to form opinions regarding their mental health condition.  Psychomotor Activity  Psychomotor Activity:No data recorded  Assets  Assets: Communication Skills; Desire for Improvement   Sleep  Sleep:No data recorded   Physical Exam: Physical Exam ROS Blood pressure 103/88, pulse 86, temperature 97.7 F (36.5 C), temperature source Oral, resp. rate 18, height 4\' 11"  (1.499 m), weight 63 kg, SpO2 97%. Body mass index is 28.05 kg/m.   Treatment Plan Summary: Daily contact with patient to assess and evaluate symptoms and progress in treatment and Medication management  Patient is a 47 year old female with the above-stated past psychiatric history who is seen in  follow-up.  Chart reviewed. Patient discussed with nursing. Patient appears at her mental baseline. No changes in medicine made today.  Diagnoses/ Active  problems: -Schizoaffective disorder -Intellectual disability   PLAN:  Safety and Monitoring: continue inpatient psych admission; 15-minute checks; daily contact with patient to assess and evaluate symptoms and progress in treatment; psychoeducation.Vital signs: q12 hours. Precautions: suicide, elopement, and assault. Placed on room lock out for meals, snacks and groups.  Psychiatric Problems: -Continue Sertraline150 mg po Q daily for depression/anxiety -Continue Buspar 15 mg po bid for anxiety.  -Continue paliperidone 6 mg po qd for mood stabilization -Continue Trazodone 50 mg nightly for insomnia   - Metabolic profile and EKG monitoring obtained while on an atypical antipsychotic (BMI: Lipid Panel: HbgA1c: QTc:)   - Encouraged patient to participate in unit milieu and in scheduled group therapies   Medical Problems: -Continue Imitrex 25 mg PRN BID for migraines -Continue Colace 100 mg daily for constipation -Continue Vitamin D 50.000 units weekly for bone health. -Continue Nicoderm 21 mg topically Q 24 hrs for nicotine withdrawal management. -Continue vitamin B12 1000 mcg IM weekly for 4 weeks then monthly -Continue Melatonin 5 mg nightly for sleep -Continue Protonix EC 40 mg p.o. daily for GERD -Continue MiraLAX 17 g p.o. PRN for constipation. -Continue hydroxyzine 25 mg p.o. 3 times daily as needed for anxiety -Continue Ensure nutritional shakes TID in between meals  -Previously discontinued Hydroxyzine 50 mg nightly-hypotension in the mornings  -Imodium for diarrhea as needed.  PRN medications:  acetaminophen, alum & mag hydroxide-simeth, diphenhydrAMINE **OR** diphenhydrAMINE, haloperidol **OR** haloperidol lactate, hydrOXYzine, LORazepam **OR** LORazepam, magnesium hydroxide, nicotine polacrilex, polyethylene glycol,  SUMAtriptan  Pertinent Labs: no new labs ordered today  Consults: No new consults placed since yesterday    Discharge Planning: -Social work and case management to assist with discharge planning and identification of hospital follow-up needs prior to discharge -Estimated LOS: Unknown at this time -Discharge Concerns: Need to establish a safety plan; Medication compliance and effectiveness -Discharge Goals: Return home with outpatient referrals for mental health follow-up including medication management/psychotherapy Apparently patient now has a legal guardian. Placement is in progress. The patient may not have the option to go to the mother's house unless there is 24/7 supervision.   Total Time Spent in Direct Patient Care:  I personally spent 35 minutes on the unit in direct patient care. The direct patient care time included face-to-face time with the patient, reviewing the patient's chart, communicating with other professionals, and coordinating care. Greater than 50% of this time was spent in counseling or coordinating care with the patient regarding goals of hospitalization, psycho-education, and discharge planning needs.   Thalia Party, MD 06/09/2023, 9:47 AM

## 2023-06-09 NOTE — [facility] Group Notes (Signed)
[facility] Group Notes:  (Nursing/MHT/Case Management/Adjunct)  Date:  06/09/2023  Time:  12:58 PM  Type of Therapy:  Psychoeducational Skills  Participation Level:  Active  Participation Quality:  Appropriate  Affect:  Appropriate  Cognitive:  Alert and Appropriate  Insight:  Appropriate  Engagement in Group:  Engaged  Modes of Intervention:  Discussion and Education  Summary of Progress/Problems:  Patients were given education on motivation with a podcast from '' [employer] '' in which the guest was DR. ALOK K who talked about dopamine, and tapping into the power you have to get where you want to go in life. ''  Pt attended late and was appropriate.   Yolanda [name] 06/09/2023, 12:58 PM

## 2023-06-09 NOTE — [facility] Group Notes (Signed)
[facility] Group Notes:  (Nursing/MHT/Case Management/Adjunct)  Date:  06/09/2023  Time:  9:30 PM  Type of Therapy:  Psychoeducational Skills  Participation Level:  Active  Participation Quality:  Appropriate  Affect:  Appropriate  Cognitive:  Appropriate  Insight:  Appropriate  Engagement in Group:  Engaged  Modes of Intervention:  Education  Summary of Progress/Problems: Patient rated her day as a 9 out of 10. She shared in group that her peers made her smile today and that she had a good visit with her friend.   Ruslan Mccabe S 06/09/2023, 9:30 PM

## 2023-06-09 NOTE — Progress Notes (Signed)
D) Pt received calm, visible, participating in milieu, and in no acute distress. Pt A & O x4. Pt denies SI, HI, A/ V H, depression, anxiety and pain at this time. A) Pt encouraged to drink fluids. Pt encouraged to come to staff with needs. Pt encouraged to attend and participate in groups. Pt encouraged to set reachable goals.  R) Pt remained safe on unit, in no acute distress, will continue to assess.     06/09/23 2000  Psych Admission Type (Psych Patients Only)  Admission Status Involuntary  Psychosocial Assessment  Patient Complaints Sadness  Eye Contact Fair  Facial Expression Sad  Affect Depressed  Speech Soft  Interaction Childlike  Motor Activity Slow  Appearance/Hygiene Disheveled  Behavior Characteristics Calm;Appropriate to situation  Mood Sad  Thought Process  Coherency WDL  Content WDL  Delusions WDL  Perception WDL  Hallucination None reported or observed  Judgment WDL  Confusion WDL  Danger to Self  Current suicidal ideation? Denies  Agreement Not to Harm Self Yes  Description of Agreement verbal  Danger to Others  Danger to Others None reported or observed

## 2023-06-09 NOTE — Plan of Care (Signed)
  Problem: Activity: Goal: Interest or engagement in leisure activities will improve Outcome: Progressing   Problem: Coping: Goal: Will verbalize feelings Outcome: Progressing   

## 2023-06-09 NOTE — Progress Notes (Signed)
Pt did not attend goals group. 

## 2023-06-09 NOTE — Progress Notes (Signed)
   06/09/23 1400  Psych Admission Type (Psych Patients Only)  Admission Status Involuntary  Psychosocial Assessment  Patient Complaints Depression;Hopelessness  Eye Contact Fair  Facial Expression Sad  Affect Depressed  Speech Soft  Interaction Childlike  Motor Activity Slow  Appearance/Hygiene Disheveled  Behavior Characteristics Cooperative;Calm  Mood Sad;Preoccupied  Thought Process  Coherency WDL  Content WDL  Delusions WDL  Perception WDL  Hallucination None reported or observed  Judgment WDL  Confusion WDL  Danger to Self  Current suicidal ideation? Denies  Danger to Others  Danger to Others None reported or observed

## 2023-06-10 DIAGNOSIS — F251 Schizoaffective disorder, depressive type: Secondary | ICD-10-CM | POA: Diagnosis not present

## 2023-06-10 NOTE — [facility] Group Notes (Signed)
Spiritual care group on grief and loss facilitated by Chaplain Dyanne Carrel, [facility]  Group Goal: Support / Education around grief and loss  Members engage in facilitated group support and psycho-social education.  Group Description:  Following introductions and group rules, group members engaged in facilitated group dialogue and support around topic of loss, with particular support around experiences of loss in their lives. Group Identified types of loss (relationships / self / things) and identified patterns, circumstances, and changes that precipitate losses. Reflected on thoughts / feelings around loss, normalized grief responses, and recognized variety in grief experience. Group encouraged individual reflection on safe space and on the coping skills that they are already utilizing.  Group drew on Adlerian / Rogerian and narrative framework  Patient Progress: Yolanda [name] attended group.  Though her verbal participation was minimal, she demonstrated engagement in the group conversation.

## 2023-06-10 NOTE — Plan of Care (Signed)
  Problem: Health Behavior/Discharge Planning: Goal: Compliance with therapeutic regimen will improve Outcome: Progressing   Problem: Safety: Goal: Periods of time without injury will increase Outcome: Progressing

## 2023-06-10 NOTE — Progress Notes (Signed)
[facility] MD Progress Note  06/10/2023 1:22 PM Elsia VOLENA DILDAY  MRN:  [id number]  Principal Problem: Schizoaffective disorder, depressive type (HCC) Diagnosis: Principal Problem:   Schizoaffective disorder, depressive type (HCC) Active Problems:   GERD (gastroesophageal reflux disease)   Intellectual disability   Tobacco use disorder  Reason for admission: Patient is a  47y.o. female with h/o Schizoaffective disorder, who presents to the [facility] unit due to suicidal ideations with a plan to stab self.  Today's assessment notes:   Patient seen and examined in her room lying on her bed.  She presents alert and oriented to self, place, and situation.  When asked what her plans is for the day, responds, "planning to get the 'H' out of here as soon as I can."  Rates depression as #2/10, with 10 being high severity.  Denies any acute discomfort.  Chart reviewed and findings shared with the treatment team and consult with attending psychiatrist.  Patient able to perform her ADLs with occasional assistance as needed.  Denies paranoia or delusional thinking. Reports that anxiety is at manageable level and rates as #2/10, with 10 being high severity Nursing staff report patient sleeping over 7.25 hours last night.   Reports appetite is better.   Concentration is good Energy level is adequate Denies SI, HI, or AVH  Denies having side effects to current psychiatric medications.   Labs: no new results for review.  Total Time spent with patient: 35 minutes  Past Psychiatric History: see H&P  Past Medical History:  Past Medical History:  Diagnosis Date   Bipolar affect, depressed (HCC)    Constipation 08/17/2022   Depression    Falls 07/21/2022   Fracture of femoral neck, right, closed (HCC) 01/17/2022   Herpes simplex 08/22/2017   Open fracture dislocation of right elbow joint 01/17/2022    Past Surgical History:  Procedure Laterality Date   NO PAST SURGERIES     SALPINGECTOMY     Family  History: History reviewed. No pertinent family history. Family Psychiatric  History: see H&P Social History:  Social History   Substance and Sexual Activity  Alcohol Use Yes     Social History   Substance and Sexual Activity  Drug Use Yes   Types: Cocaine, Marijuana    Social History   Socioeconomic History   Marital status: Single    Spouse name: Not on file   Number of children: Not on file   Years of education: Not on file   Highest education level: Not on file  Occupational History   Not on file  Tobacco Use   Smoking status: Every Day   Smokeless tobacco: Not on file  Substance and Sexual Activity   Alcohol use: Yes   Drug use: Yes    Types: Cocaine, Marijuana   Sexual activity: Yes  Other Topics Concern   Not on file  Social History Narrative   Not on file   Social Determinants of Health   Financial Resource Strain: Low Risk  (09/12/2022)   Received from [facility], [facility]   Overall Financial Resource Strain (CARDIA)    Difficulty of Paying Living Expenses: Not hard at all  Food Insecurity: Patient Declined (12/20/2022)   Hunger Vital Sign    Worried About Running Out of Food in the Last Year: Patient declined    Ran Out of Food in the Last Year: Patient declined  Transportation Needs: No Transportation Needs (12/20/2022)   PRAPARE - Administrator, Civil Service (  Medical): No    Lack of Transportation (Non-Medical): No  Physical Activity: Not on file  Stress: No Stress Concern Present (07/17/2022)   Received from [facility], [facility] - Occupational Stress Questionnaire    Feeling of Stress : Not at all  Social Connections: Unknown (07/16/2022)   Received from [facility], [facility]   Social Network    Social Network: Not on file   Additional Social History:    Sleep: Good  Appetite:  Good  Current Medications: Current Facility-Administered Medications  Medication Dose  Route Frequency Provider Last Rate Last Admin   acetaminophen (TYLENOL) tablet 650 mg  650 mg Oral Q6H PRN [name], NP   650 mg at 06/09/23 1641   alum & mag hydroxide-simeth (MAALOX/MYLANTA) 200-200-20 MG/5ML suspension 30 mL  30 mL Oral Q4H PRN Onuoha, Chinwendu V, NP   30 mL at 05/31/23 0801   busPIRone (BUSPAR) tablet 15 mg  15 mg Oral BID [name], NP   15 mg at 06/10/23 8295   cyanocobalamin (VITAMIN B12) injection 1,000 mcg  1,000 mcg Intramuscular Q30 days Sarita Bottom, MD   1,000 mcg at 06/02/23 1129   diphenhydrAMINE (BENADRYL) capsule 50 mg  50 mg Oral TID PRN [name], NP   50 mg at 06/03/23 1110   Or   diphenhydrAMINE (BENADRYL) injection 50 mg  50 mg Intramuscular TID PRN [name], NP       docusate sodium (COLACE) capsule 100 mg  100 mg Oral Daily [name], NP   100 mg at 06/10/23 6213   feeding supplement (ENSURE ENLIVE / ENSURE PLUS) liquid 237 mL  237 mL Oral TID BM [name], NP   237 mL at 06/09/23 2112   haloperidol (HALDOL) tablet 5 mg  5 mg Oral TID PRN [name], NP   5 mg at 06/03/23 1110   Or   haloperidol lactate (HALDOL) injection 5 mg  5 mg Intramuscular TID PRN [name], NP       hydrOXYzine (ATARAX) tablet 25 mg  25 mg Oral TID PRN [name], DO   25 mg at 05/27/23 1146   LORazepam (ATIVAN) tablet 2 mg  2 mg Oral TID PRN [name], NP   2 mg at 06/03/23 1110   Or   LORazepam (ATIVAN) injection 2 mg  2 mg Intramuscular TID PRN [name], NP       magnesium hydroxide (MILK OF MAGNESIA) suspension 30 mL  30 mL Oral Daily PRN [name], NP   30 mL at 04/13/23 0814   melatonin tablet 5 mg  5 mg Oral QHS Nkwenti, Doris, NP   5 mg at 06/09/23 2112   nicotine polacrilex (NICORETTE) gum 2 mg  2 mg Oral PRN [name], MD   2 mg at 06/09/23 1033   paliperidone (INVEGA) 24 hr tablet 6 mg  6 mg Oral Daily [name], NP   6 mg at 06/09/23 2112   pantoprazole (PROTONIX) EC tablet 40 mg  40 mg Oral Daily  Nwoko, Nicole Kindred I, NP   40 mg at 06/10/23 0865   polyethylene glycol (MIRALAX / GLYCOLAX) packet 17 g  17 g Oral Daily PRN [name], MD   17 g at 06/07/23 1847   polyethylene glycol (MIRALAX / GLYCOLAX) packet 17 g  17 g Oral Daily [name], MD   17 g at 06/10/23 0824   sertraline (ZOLOFT) tablet 150 mg  150 mg Oral Daily Massengill, Nathan, MD   150 mg at 06/10/23 2952   SUMAtriptan (IMITREX) tablet 25 mg  25 mg Oral BID PRN [name], NP   25 mg at 05/07/23 2112   traZODone (DESYREL) tablet 50 mg  50 mg Oral QHS [name], NP   50 mg at 06/09/23 2112   Vitamin D (Ergocalciferol) (DRISDOL) 1.25 MG (50000 UNIT) capsule 50,000 Units  50,000 Units Oral Q7 days [name], NP   50,000 Units at 06/08/23 1620   Lab Results: No results found for this or any previous visit (from the past 48 hour(s)).  Blood Alcohol level:  Lab Results  Component Value Date   ETH <10 12/19/2022   ETH <10 11/21/2019    Metabolic Disorder Labs: Lab Results  Component Value Date   HGBA1C 4.4 (L) 12/21/2022   MPG 79.58 12/21/2022   No results found for: "PROLACTIN" Lab Results  Component Value Date   CHOL 218 (H) 12/21/2022   TRIG 81 12/21/2022   HDL 53 12/21/2022   CHOLHDL 4.1 12/21/2022   VLDL 16 12/21/2022   LDLCALC 149 (H) 12/21/2022   LDLCALC 110 (H) 03/13/2011    Physical Findings: AIMS: Facial and Oral Movements Muscles of Facial Expression: None Lips and Perioral Area: None Jaw: None Tongue: None,Extremity Movements Upper (arms, wrists, hands, fingers): None Lower (legs, knees, ankles, toes): None, Trunk Movements Neck, shoulders, hips: None, Global Judgements Severity of abnormal movements overall : None Incapacitation due to abnormal movements: None Patient's awareness of abnormal movements: No Awareness, Dental Status Current problems with teeth and/or dentures?: No Does patient usually wear dentures?: No  CIWA:    COWS:     Musculoskeletal: Strength &  Muscle Tone: within normal limits Gait & Station: normal Patient leans: N/A  Psychiatric Specialty Exam:  Appearance:  AAF, appearing stated age,  wearing appropriate to the situation casual/hospital clothes, with fair grooming and hygiene. Normal level of alertness and appropriate facial expression.  Attitude/Behavior: calm, cooperative, engaging with appropriate eye contact.  Motor: WNL; dyskinesias not evident. Gait appears in full range.  Speech: spontaneous, clear, coherent, normal comprehension.  Mood: euthymic, "okay".  Affect: appropriately-reactive.  Thought process: patient appears coherent, organized, goal-directed, concrete but linear with questions  Thought content: patient denies suicidal thoughts, denies homicidal thoughts; did not express any delusions.  Thought perception: patient denies auditory and visual hallucinations. Did not appear internally stimulated.  Cognition: patient is alert and oriented in self, place, date; with intact attention and concentration.  Insight: limited, in regards of understanding of presence, nature, cause, and significance of mental or emotional problem.  Judgement: limited, in regards of ability to make good decisions concerning the appropriate thing to do in various situations, including ability to form opinions regarding their mental health condition.  Psychomotor Activity  Psychomotor Activity:Psychomotor Activity: Normal  Assets  Assets: Communication Skills; Desire for Improvement; Physical Health; Resilience  Sleep  Sleep:Sleep: Good Number of Hours of Sleep: 7.25  Physical Exam: Physical Exam Vitals and nursing note reviewed.  HENT:     Head: Normocephalic.     Nose: Nose normal.     Mouth/Throat:     Mouth: Mucous membranes are moist.  Eyes:     Extraocular Movements: Extraocular movements intact.  Cardiovascular:     Rate and Rhythm: Normal rate.     Pulses: Normal pulses.  Pulmonary:     Effort:  Pulmonary effort is normal.  Abdominal:     Comments: Deferred  Genitourinary:  Comments: Deferred Musculoskeletal:     Cervical back: Normal range of motion.  Skin:    General: Skin is warm.  Neurological:     General: No focal deficit present.     Mental Status: She is alert and oriented to person, place, and time.  Psychiatric:        Mood and Affect: Mood normal.        Behavior: Behavior normal.    Review of Systems  Constitutional:  Negative for chills and fever.  HENT:  Negative for sore throat.   Eyes:  Negative for blurred vision.  Respiratory:  Negative for cough, sputum production, shortness of breath and wheezing.   Cardiovascular:  Negative for chest pain and palpitations.  Gastrointestinal:  Negative for abdominal pain, constipation, diarrhea, heartburn, nausea and vomiting.  Genitourinary:  Negative for dysuria, frequency and urgency.  Musculoskeletal:  Negative for back pain, myalgias and neck pain.  Skin:  Negative for itching and rash.  Neurological:  Negative for dizziness, tingling, tremors, sensory change and headaches.  Endo/Heme/Allergies:        See allergy listing  Psychiatric/Behavioral:  Positive for depression. Negative for hallucinations, substance abuse and suicidal ideas. The patient is nervous/anxious. The patient does not have insomnia.    Blood pressure 99/71, pulse 83, temperature 97.8 F (36.6 C), resp. rate 16, height 4\' 11"  (1.499 m), weight 63 kg, SpO2 98%. Body mass index is 28.05 kg/m.  Treatment Plan Summary: Daily contact with patient to assess and evaluate symptoms and progress in treatment and Medication management  Patient is a 47 year old female with the above-stated past psychiatric history who is seen in follow-up.  Chart reviewed. Patient discussed with nursing. Patient appears at her mental baseline. No changes in medicine made today.  Diagnoses/ Active problems: -Schizoaffective disorder -Intellectual  disability  PLAN:  Safety and Monitoring: continue inpatient psych admission; 15-minute checks; daily contact with patient to assess and evaluate symptoms and progress in treatment; psychoeducation.Vital signs: q12 hours. Precautions: suicide, elopement, and assault. Placed on room lock out for meals, snacks and groups.  Psychiatric Problems: -Continue Sertraline150 mg po Q daily for depression/anxiety -Continue Buspar 15 mg po bid for anxiety.  -Continue paliperidone 6 mg po qd for mood stabilization -Continue Trazodone 50 mg nightly for insomnia   - Metabolic profile and EKG monitoring obtained while on an atypical antipsychotic (BMI: Lipid Panel: HbgA1c: QTc:)   - Encouraged patient to participate in unit milieu and in scheduled group therapies   Medical Problems: -Continue Imitrex 25 mg PRN BID for migraines -Continue Colace 100 mg daily for constipation -Continue Vitamin D 50.000 units weekly for bone health. -Continue Nicoderm 21 mg topically Q 24 hrs for nicotine withdrawal management. -Continue vitamin B12 1000 mcg IM weekly for 4 weeks then monthly -Continue Melatonin 5 mg nightly for sleep -Continue Protonix EC 40 mg p.o. daily for GERD -Continue MiraLAX 17 g p.o. PRN for constipation. -Continue hydroxyzine 25 mg p.o. 3 times daily as needed for anxiety -Continue Ensure nutritional shakes TID in between meals  -Previously discontinued Hydroxyzine 50 mg nightly-hypotension in the mornings  -Imodium for diarrhea as needed.  PRN medications:  acetaminophen, alum & mag hydroxide-simeth, diphenhydrAMINE **OR** diphenhydrAMINE, haloperidol **OR** haloperidol lactate, hydrOXYzine, LORazepam **OR** LORazepam, magnesium hydroxide, nicotine polacrilex, polyethylene glycol, SUMAtriptan  Pertinent Labs: no new labs ordered today  Consults: No new consults placed since yesterday    Discharge Planning: -Social work and case management to assist with discharge planning and  identification of hospital  follow-up needs prior to discharge -Estimated LOS: Unknown at this time -Discharge Concerns: Need to establish a safety plan; Medication compliance and effectiveness -Discharge Goals: Return home with outpatient referrals for mental health follow-up including medication management/psychotherapy Apparently patient now has a legal guardian. Placement is in progress. The patient may not have the option to go to the mother's house unless there is 24/7 supervision.    Cecilie Lowers, FNP 06/10/2023, 1:22 PM Patient ID: Larey Brick, female   DOB: [birth year], 47 y.o.   MRN: [id number]

## 2023-06-10 NOTE — Group Note (Signed)
Date:  06/10/2023 Time:  9:19 PM  Group Topic/Focus:  Alcoholics Anonymous Meeting    Participation Level:  Did Not Attend  Participation Quality:   N/A  Affect:   N/A  Cognitive:   N/A  Insight: None  Engagement in Group:   N/A  Modes of Intervention:   N/A  Additional Comments:  Patient did not attend AA  Yolanda [name] 06/10/2023, 9:19 PM

## 2023-06-10 NOTE — Progress Notes (Signed)
Patient observed in dayroom socializing appropriately. Patient received tylenol po prn due to L foot pain stating she did not hurt it and is not really sure why it hurts. Patient denied any other pain or discomfort and is med compliant.     06/10/23 2956  Psych Admission Type (Psych Patients Only)  Admission Status Involuntary  Psychosocial Assessment  Patient Complaints Sadness  Eye Contact Fair  Facial Expression Sad  Affect Depressed  Speech Soft  Interaction Childlike  Motor Activity Slow;Tremors  Appearance/Hygiene Disheveled  Behavior Characteristics Cooperative  Mood Depressed  Thought Process  Coherency Concrete thinking  Content WDL  Delusions None reported or observed  Perception WDL  Hallucination None reported or observed  Judgment Impaired  Confusion None  Danger to Self  Current suicidal ideation? Denies  Agreement Not to Harm Self Yes  Description of Agreement verbal  Danger to Others  Danger to Others None reported or observed

## 2023-06-10 NOTE — Group Note (Signed)
Recreation Therapy Group Note   Group Topic:Healthy Decision Making  Group Date: 06/10/2023 Start Time: 0935 End Time: 1010 Facilitators: Kijana Cromie-McCall, LRT,CTRS Location: [address] Dayroom   Group Topic: Decision Making, Problem Solving, Communication  Goal Area(s) Addresses:  Patient will effectively work with peer towards shared goal.  Patient will identify factors that guided their decision making.  Patient will pro-socially communicate ideas during group session.   Intervention: Survival Scenario - pencil, paper  Group Description: Patients were given a scenario that they were going to be stranded on a deserted [place] for several months before being rescued. Writer tasked them with making a list of 15 things they would choose to bring with them for "survival". The list of items was prioritized most important to least. Each patient would come up with their own list, then work together to create a new list of 15 items while in a group of 3-5 peers. LRT discussed each person's list and how it differed from others. The debrief included discussion of priorities, good decisions versus bad decisions, and how it is important to think before acting so we can make the best decision possible. LRT tied the concept of effective communication among group members to patient's support systems outside of the hospital and its benefit post discharge.  Education: Pharmacist, community, Priorities, Support System, Discharge Planning   Education Outcome: Acknowledges education/In group clarification/Needs additional education   Affect/Mood: N/A   Participation Level: Did not attend    Clinical Observations/Individualized Feedback:      Plan: Continue to engage patient in RT group sessions 2-3x/week.   Thania Woodlief-McCall, LRT,CTRS 06/10/2023 12:19 PM

## 2023-06-11 DIAGNOSIS — F251 Schizoaffective disorder, depressive type: Secondary | ICD-10-CM | POA: Diagnosis not present

## 2023-06-11 NOTE — Progress Notes (Signed)
   06/11/23 1000  Psych Admission Type (Psych Patients Only)  Admission Status Involuntary  Psychosocial Assessment  Patient Complaints Sadness  Eye Contact Brief  Facial Expression Sad  Affect Depressed;Anxious  Speech Soft  Interaction Childlike  Motor Activity Slow  Appearance/Hygiene Disheveled  Behavior Characteristics Cooperative  Mood Depressed;Sad  Thought Process  Coherency WDL  Content WDL  Delusions None reported or observed  Perception WDL  Hallucination None reported or observed  Judgment Impaired  Confusion None  Danger to Self  Current suicidal ideation? Denies  Description of Suicide Plan No plan  Self-Injurious Behavior No self-injurious ideation or behavior indicators observed or expressed   Agreement Not to Harm Self Yes  Description of Agreement Verbal  Danger to Others  Danger to Others None reported or observed

## 2023-06-11 NOTE — Progress Notes (Signed)
[facility] MD Progress Note  06/11/2023 12:57 PM Yolanda [name]  MRN:  [id number]  Principal Problem: Schizoaffective disorder, depressive type (HCC) Diagnosis: Principal Problem:   Schizoaffective disorder, depressive type (HCC) Active Problems:   GERD (gastroesophageal reflux disease)   Intellectual disability   Tobacco use disorder  Reason for admission: Patient is a  47y.o. female with h/o Schizoaffective disorder, who presents to the [facility] unit due to suicidal ideations with a plan to stab self.  Today's assessment notes:   Patient examined on the unit sitting up in a chair.  She reports her mood is less depressed and improving.  Rates depression as #2/10, with 10 being high severity.  Observed with smiles when answering assessment questions.  She presents alert and oriented to self, place, and situation.  She reports her goal today is to take a shower, and clean up her room.  She is able to perform her ADLs with occasional reminders.  Denies any acute discomfort.  Chart reviewed and findings shared with the treatment team and consult with attending psychiatrist.  Denies paranoia or delusional thinking.  Since patient had been in the hospital 173 days, the interdisciplinary treatment team today suggested the whole team meeting with DSS to come up with a concrete plan regarding patient's discharge from the hospital.  The LCSW to follow up with this plan and inform the team members.  We will continue to monitor patient for safety. Reports that anxiety is at manageable level and rates as #1/10, with 10 being high severity Nursing staff report patient sleeping over 8.75 hours last night.   Reports appetite is better.   Concentration is good Energy level is adequate Denies SI, HI, or AVH  Denies having side effects to current psychiatric medications.   Labs: no new results for review.  Total Time spent with patient: 35 minutes  Past Psychiatric History: see H&P  Past Medical History:  Past  Medical History:  Diagnosis Date   Bipolar affect, depressed (HCC)    Constipation 08/17/2022   Depression    Falls 07/21/2022   Fracture of femoral neck, right, closed (HCC) 01/17/2022   Herpes simplex 08/22/2017   Open fracture dislocation of right elbow joint 01/17/2022    Past Surgical History:  Procedure Laterality Date   NO PAST SURGERIES     SALPINGECTOMY     Family History: History reviewed. No pertinent family history. Family Psychiatric  History: see H&P Social History:  Social History   Substance and Sexual Activity  Alcohol Use Yes     Social History   Substance and Sexual Activity  Drug Use Yes   Types: Cocaine, Marijuana    Social History   Socioeconomic History   Marital status: Single    Spouse name: Not on file   Number of children: Not on file   Years of education: Not on file   Highest education level: Not on file  Occupational History   Not on file  Tobacco Use   Smoking status: Every Day   Smokeless tobacco: Not on file  Substance and Sexual Activity   Alcohol use: Yes   Drug use: Yes    Types: Cocaine, Marijuana   Sexual activity: Yes  Other Topics Concern   Not on file  Social History Narrative   Not on file   Social Determinants of Health   Financial Resource Strain: Low Risk  (09/12/2022)   Received from [facility], [facility]   Overall Financial Resource Strain (CARDIA)  Difficulty of Paying Living Expenses: Not hard at all  Food Insecurity: Patient Declined (12/20/2022)   Hunger Vital Sign    Worried About Running Out of Food in the Last Year: Patient declined    Ran Out of Food in the Last Year: Patient declined  Transportation Needs: No Transportation Needs (12/20/2022)   PRAPARE - Administrator, Civil Service (Medical): No    Lack of Transportation (Non-Medical): No  Physical Activity: Not on file  Stress: No Stress Concern Present (07/17/2022)   Received from [facility], [facility] - Occupational Stress Questionnaire    Feeling of Stress : Not at all  Social Connections: Unknown (07/16/2022)   Received from [facility], [facility]   Social Network    Social Network: Not on file   Additional Social History:    Sleep: Good  Appetite:  Good  Current Medications: Current Facility-Administered Medications  Medication Dose Route Frequency Provider Last Rate Last Admin   acetaminophen (TYLENOL) tablet 650 mg  650 mg Oral Q6H PRN [name], NP   650 mg at 06/10/23 1402   alum & mag hydroxide-simeth (MAALOX/MYLANTA) 200-200-20 MG/5ML suspension 30 mL  30 mL Oral Q4H PRN Onuoha, Chinwendu V, NP   30 mL at 05/31/23 0801   busPIRone (BUSPAR) tablet 15 mg  15 mg Oral BID [name], NP   15 mg at 06/11/23 0747   cyanocobalamin (VITAMIN B12) injection 1,000 mcg  1,000 mcg Intramuscular Q30 days Sarita Bottom, MD   1,000 mcg at 06/02/23 1129   diphenhydrAMINE (BENADRYL) capsule 50 mg  50 mg Oral TID PRN [name], NP   50 mg at 06/03/23 1110   Or   diphenhydrAMINE (BENADRYL) injection 50 mg  50 mg Intramuscular TID PRN [name], NP       docusate sodium (COLACE) capsule 100 mg  100 mg Oral Daily [name], NP   100 mg at 06/11/23 0747   feeding supplement (ENSURE ENLIVE / ENSURE PLUS) liquid 237 mL  237 mL Oral TID BM [name], NP   237 mL at 06/10/23 2056   haloperidol (HALDOL) tablet 5 mg  5 mg Oral TID PRN [name], NP   5 mg at 06/03/23 1110   Or   haloperidol lactate (HALDOL) injection 5 mg  5 mg Intramuscular TID PRN [name], NP       hydrOXYzine (ATARAX) tablet 25 mg  25 mg Oral TID PRN [name], DO   25 mg at 05/27/23 1146   LORazepam (ATIVAN) tablet 2 mg  2 mg Oral TID PRN [name], NP   2 mg at 06/03/23 1110   Or   LORazepam (ATIVAN) injection 2 mg  2 mg Intramuscular TID PRN [name], NP       magnesium hydroxide (MILK OF MAGNESIA) suspension 30 mL  30 mL Oral Daily PRN  [name], NP   30 mL at 06/10/23 2121   melatonin tablet 5 mg  5 mg Oral QHS Nkwenti, Doris, NP   5 mg at 06/10/23 2054   nicotine polacrilex (NICORETTE) gum 2 mg  2 mg Oral PRN [name], MD   2 mg at 06/10/23 1402   paliperidone (INVEGA) 24 hr tablet 6 mg  6 mg Oral Daily [name], NP   6 mg at 06/10/23 2054   pantoprazole (PROTONIX) EC tablet 40 mg  40 mg Oral Daily Sanjuana Kava, NP  40 mg at 06/11/23 0747   polyethylene glycol (MIRALAX / GLYCOLAX) packet 17 g  17 g Oral Daily PRN [name], MD   17 g at 06/07/23 1847   polyethylene glycol (MIRALAX / GLYCOLAX) packet 17 g  17 g Oral Daily [name], MD   17 g at 06/11/23 0747   sertraline (ZOLOFT) tablet 150 mg  150 mg Oral Daily Massengill, Harrold Donath, MD   150 mg at 06/11/23 0747   SUMAtriptan (IMITREX) tablet 25 mg  25 mg Oral BID PRN [name], NP   25 mg at 05/07/23 2112   traZODone (DESYREL) tablet 50 mg  50 mg Oral QHS [name], NP   50 mg at 06/10/23 2054   Vitamin D (Ergocalciferol) (DRISDOL) 1.25 MG (50000 UNIT) capsule 50,000 Units  50,000 Units Oral Q7 days [name], NP   50,000 Units at 06/08/23 1620   Lab Results: No results found for this or any previous visit (from the past 48 hour(s)).  Blood Alcohol level:  Lab Results  Component Value Date   ETH <10 12/19/2022   ETH <10 11/21/2019    Metabolic Disorder Labs: Lab Results  Component Value Date   HGBA1C 4.4 (L) 12/21/2022   MPG 79.58 12/21/2022   No results found for: "PROLACTIN" Lab Results  Component Value Date   CHOL 218 (H) 12/21/2022   TRIG 81 12/21/2022   HDL 53 12/21/2022   CHOLHDL 4.1 12/21/2022   VLDL 16 12/21/2022   LDLCALC 149 (H) 12/21/2022   LDLCALC 110 (H) 03/13/2011    Physical Findings: AIMS: Facial and Oral Movements Muscles of Facial Expression: None Lips and Perioral Area: None Jaw: None Tongue: None,Extremity Movements Upper (arms, wrists, hands, fingers): None Lower (legs, knees,  ankles, toes): None, Trunk Movements Neck, shoulders, hips: None, Global Judgements Severity of abnormal movements overall : None Incapacitation due to abnormal movements: None Patient's awareness of abnormal movements: No Awareness, Dental Status Current problems with teeth and/or dentures?: No Does patient usually wear dentures?: No  CIWA:    COWS:     Musculoskeletal: Strength & Muscle Tone: within normal limits Gait & Station: normal Patient leans: N/A  Psychiatric Specialty Exam:  Appearance:  AAF, appearing stated age,  wearing appropriate to the situation casual/hospital clothes, with fair grooming and hygiene. Normal level of alertness and appropriate facial expression.  Attitude/Behavior: calm, cooperative, engaging with appropriate eye contact.  Motor: WNL; dyskinesias not evident. Gait appears in full range.  Speech: spontaneous, clear, coherent, normal comprehension.  Mood: euthymic, "okay".  Affect: appropriately-reactive.  Thought process: patient appears coherent, organized, goal-directed, concrete but linear with questions  Thought content: patient denies suicidal thoughts, denies homicidal thoughts; did not express any delusions.  Thought perception: patient denies auditory and visual hallucinations. Did not appear internally stimulated.  Cognition: patient is alert and oriented in self, place, date; with intact attention and concentration.  Insight: limited, in regards of understanding of presence, nature, cause, and significance of mental or emotional problem.  Judgement: limited, in regards of ability to make good decisions concerning the appropriate thing to do in various situations, including ability to form opinions regarding their mental health condition.  Psychomotor Activity  Psychomotor Activity:Psychomotor Activity: Normal  Assets  Assets: Communication Skills; Resilience; Physical Health  Sleep  Sleep:Sleep: Good Number of Hours of Sleep:  8.75  Physical Exam: Physical Exam Vitals and nursing note reviewed.  Constitutional:      General: She is not in acute distress.    Appearance:  She is obese. She is not toxic-appearing.  HENT:     Head: Normocephalic.     Nose: Nose normal.     Mouth/Throat:     Mouth: Mucous membranes are moist.  Eyes:     Extraocular Movements: Extraocular movements intact.  Cardiovascular:     Rate and Rhythm: Normal rate.     Pulses: Normal pulses.  Pulmonary:     Effort: Pulmonary effort is normal.  Abdominal:     Comments: Deferred  Genitourinary:    Comments: Deferred Musculoskeletal:     Cervical back: Normal range of motion.  Skin:    General: Skin is warm.  Neurological:     General: No focal deficit present.     Mental Status: She is alert and oriented to person, place, and time.  Psychiatric:        Mood and Affect: Mood normal.        Behavior: Behavior normal.    Review of Systems  Constitutional:  Negative for chills and fever.  HENT:  Negative for sore throat.   Eyes:  Negative for blurred vision.  Respiratory:  Negative for cough, sputum production, shortness of breath and wheezing.   Cardiovascular:  Negative for chest pain and palpitations.  Gastrointestinal:  Negative for abdominal pain, constipation, diarrhea, heartburn, nausea and vomiting.  Genitourinary:  Negative for dysuria, frequency and urgency.  Musculoskeletal:  Negative for back pain, myalgias and neck pain.  Skin:  Negative for itching and rash.  Neurological:  Negative for dizziness, tingling, tremors, sensory change and headaches.  Endo/Heme/Allergies:        See allergy listing  Psychiatric/Behavioral:  Positive for depression. Negative for hallucinations, substance abuse and suicidal ideas. The patient is nervous/anxious. The patient does not have insomnia.    Blood pressure 97/72, pulse 80, temperature 97.8 F (36.6 C), resp. rate 16, height 4\' 11"  (1.499 m), weight 63 kg, SpO2 98%. Body mass  index is 28.05 kg/m.  Treatment Plan Summary: Daily contact with patient to assess and evaluate symptoms and progress in treatment and Medication management  Patient is a 47 year old female with the above-stated past psychiatric history who is seen in follow-up.  Chart reviewed. Patient discussed with nursing. Patient appears at her mental baseline. No changes in medicine made today.  Diagnoses/ Active problems: -Schizoaffective disorder -Intellectual disability  PLAN:  Safety and Monitoring: continue inpatient psych admission; 15-minute checks; daily contact with patient to assess and evaluate symptoms and progress in treatment; psychoeducation.Vital signs: q12 hours. Precautions: suicide, elopement, and assault. Placed on room lock out for meals, snacks and groups.  Psychiatric Problems: -Continue Sertraline150 mg po Q daily for depression/anxiety -Continue Buspar 15 mg po bid for anxiety.  -Continue paliperidone 6 mg po qd for mood stabilization -Continue Trazodone 50 mg nightly for insomnia   - Metabolic profile and EKG monitoring obtained while on an atypical antipsychotic (BMI: Lipid Panel: HbgA1c: QTc:)   - Encouraged patient to participate in unit milieu and in scheduled group therapies   Medical Problems: -Continue Imitrex 25 mg PRN BID for migraines -Continue Colace 100 mg daily for constipation -Continue Vitamin D 50.000 units weekly for bone health. -Continue Nicoderm 21 mg topically Q 24 hrs for nicotine withdrawal management. -Continue vitamin B12 1000 mcg IM weekly for 4 weeks then monthly -Continue Melatonin 5 mg nightly for sleep -Continue Protonix EC 40 mg p.o. daily for GERD -Continue MiraLAX 17 g p.o. PRN for constipation. -Continue hydroxyzine 25 mg p.o. 3 times daily as needed  for anxiety -Continue Ensure nutritional shakes TID in between meals  -Previously discontinued Hydroxyzine 50 mg nightly-hypotension in the mornings  -Imodium for diarrhea as  needed.  PRN medications:  acetaminophen, alum & mag hydroxide-simeth, diphenhydrAMINE **OR** diphenhydrAMINE, haloperidol **OR** haloperidol lactate, hydrOXYzine, LORazepam **OR** LORazepam, magnesium hydroxide, nicotine polacrilex, polyethylene glycol, SUMAtriptan  Pertinent Labs: no new labs ordered today  Consults: No new consults placed since yesterday    Discharge Planning: -Social work and case management to assist with discharge planning and identification of hospital follow-up needs prior to discharge -Estimated LOS: Unknown at this time -Discharge Concerns: Need to establish a safety plan; Medication compliance and effectiveness -Discharge Goals: Return home with outpatient referrals for mental health follow-up including medication management/psychotherapy Apparently patient now has a legal guardian. Placement is in progress. The patient may not have the option to go to the mother's house unless there is 24/7 supervision.    Cecilie Lowers, FNP 06/11/2023, 12:57 PM Patient ID: Yolanda [name], female   DOB: 1976/03/25, 47 y.o.   MRN: [id number] Patient ID: Yolanda [name], female   DOB: 01-13-1976, 47 y.o.   MRN: [id number]

## 2023-06-11 NOTE — Plan of Care (Signed)
  Problem: Education: Goal: Knowledge of the prescribed therapeutic regimen will improve Outcome: Progressing   Problem: Activity: Goal: Interest or engagement in leisure activities will improve Outcome: Progressing

## 2023-06-11 NOTE — Progress Notes (Signed)
   06/11/23 0024  Psych Admission Type (Psych Patients Only)  Admission Status Involuntary  Psychosocial Assessment  Patient Complaints Sadness  Eye Contact Brief  Facial Expression Sad  Affect Depressed  Speech Soft  Interaction Intrusive;Childlike  Motor Activity Slow  Appearance/Hygiene Disheveled  Behavior Characteristics Cooperative;Intrusive  Mood Depressed  Thought Process  Coherency Concrete thinking  Content WDL  Delusions None reported or observed  Perception WDL  Hallucination None reported or observed  Judgment Impaired  Confusion None  Danger to Self  Current suicidal ideation? Denies  Self-Injurious Behavior No self-injurious ideation or behavior indicators observed or expressed   Agreement Not to Harm Self Yes  Description of Agreement verbal contract for safety  Danger to Others  Danger to Others None reported or observed   D: Patient in room crying stating "I'm tired of being psychotic". Pt stated she is tired of being here and is missing her family. Pt started smiling when she talked about her grandson and how she missed playing with him. A: Medications administered as prescribed. Support and encouragement provided as needed.  R: Patient remains safe on the unit.

## 2023-06-11 NOTE — Plan of Care (Signed)
  Problem: Activity: Goal: Imbalance in normal sleep/wake cycle will improve Outcome: Progressing   Problem: Activity: Goal: Interest or engagement in leisure activities will improve Outcome: Not Progressing

## 2023-06-11 NOTE — Group Note (Signed)
Date:  06/12/2023 Time:  1:37 AM  Group Topic/Focus:  Wrap-Up Group:   The focus of this group is to help patients review their daily goal of treatment and discuss progress on daily workbooks.    Participation Level:  Active  Participation Quality:  Appropriate and Sharing  Affect:  Appropriate  Cognitive:  Appropriate  Insight: Appropriate  Engagement in Group:  Engaged  Modes of Intervention:  Activity and Socialization  Additional Comments:  Patient stated that she is "doing" when asked "how are you doing?" And stated that she is having an "okay day". Patient shared that she spoke with her social worker today and stated that she is hoping for discharge soon; stated that she may be going to a group home. Patient rated her day a 8/10. Patient participated in the ice breaker activity.   Yolanda [name] 06/12/2023, 1:37 AM

## 2023-06-11 NOTE — Discharge Planning (Signed)
LCSW went and spoke with patient on today to assess current mental state.  Patient reports she has been doing well.  Patient asked if she could speak to her DSS social worker, and asked if she could be provided her number.  LCSW informed the patient that LCSW will follow up with supervisor regarding contact information.  Patient expressed understanding and is aware that LCSW will continue to seek placement for her.  No other concerns were reported at this time.  LCSW contacted Dr. Cheree Ditto at [facility] [phone] to inquire about referral process and bed availability.  Dr. Cheree Ditto informed LCSW that he does not have any vacancies for females at this time.  LCSW explored if Dr. Cheree Ditto had additional community resources for facilities that except female patients with IDD diagnosis.  Dr. Cheree Ditto provided the contact information for admissions coordinator [name] [phone] [place].  LCSW was advised to follow up with Ms. Davis regarding bed availability and referral process.  LCSW thanks Dr. Ardyth Man for assistance with following up regarding placement.  LCSW contacted admissions coordinator [name] at [facility] in [place], [place] [phone], regarding bed availability and referral process. Ms. Glee Arvin stated that she currently has a co-ed facility available at this time. Ms. Glee Arvin reports if patient's guardian is okay with co-ed placement, then patient could be considered for possible placement.  Per Ms. [name] she currently accepts [facility] and Social Security for payment. Ms. Glee Arvin reports she would need updated clinicals on the patient to include psychological evaluation as appropriate. Information is to be sent to [email or web address].  Ms. Glee Arvin reports patient will have to go to a day program, complete chores, clean, etc.  Patient would be allowed to smoke cigarettes under supervision.  Patient will be required to have family involvement as  necessary.  LCSW explored if representative would be willing to screen the patient on tomorrow for appropriateness. Admissions Coordinator agreed and stated she could come on tomorrow between 12:30-1:00pm. Ms. Glee Arvin reports if the patient is appropriate then the process could take up to a week for admission. However, process can be expedited via [employer] with contact with [facility] which would be a 2-3 day process. LCSW made representative aware of the struggles the patient has had since being in the hospital. Facility aware of occasional incontinence, otherwise behavior has remained appropriate. This was not a concern for the [facility] Representative who reports she will work with the patient on this concern if accepted. Ms. Glee Arvin was made aware that LCSW will follow up with DSS to explore if they could be present at meeting and will follow up to inform. No other concerns were reported at this time.   LCSW contacted [employer] [phone] to inform of update.  Ms. Reuel Boom reports she is responsible for discharge planning, and was appreciative of LCSW assistance with referral.  Ms. Duwayne Heck reports she has sent out 6 referrals as of yesterday and today, and has not received any updates as of yet.  Ms. Reuel Boom reports if facility needs an FL2, Promised To Pay For Services letter, etc. then agency can provide those documents as requested.  Danielle aware that LCSW will be in contact to provide updates as received.  No other concerns to report.  LCSW contacted the patient's DSS representative Cherylann Ratel at [phone] to provide update regarding patient.  LCSW informed DSS representative that Admissions Coordinator [name] at [facility] is interested in screening the patient on tomorrow  06/12/2023 between 1230-1:00 PM. LCSW explored if there would be any concerns with the patient residing in a co-ed home. Per DSS, this would not be a concern as this would be approved by the  state. LCSW explored if Ms. Barbette Or would be able to attend screening on tomorrow at 1230 to 1:00 PM. Ms. Barbette Or stated that she will be present and available at that time. LCSW explored if DSS also has completed a psychological evaluation on the patient, and per Ms. Barbette Or the agency completed a MMSE a few months ago, however she will follow up with her Supervisor to see if anything else can completed. No other concerns to report at this time.   LCSW went and spoke with the patient to provide update.  LCSW informed the patient that there is a group home in [facility] willing to come and speak with her on tomorrow.  LCSW provided information regarding group home to patient.  Patient reported some excitement regarding information about the facility.  LCSW explored if patient would be willing to be interviewed on tomorrow.  Patient reports that she would.  LCSW discuss importance of presentation and good behavior.  Patient reports she will be on her best behavior on tomorrow.  LCSW explored with [facility] Danika if patient's hair could be braided down for proper presentation. Approval provided and hair complete. Patient was appreciative of LCSW assistance. Brief supportive counseling was provided to the patient. Patient aware that LCSW will see her on tomorrow at 12:15pm. No other needs to report.   Plan for tomorrow is for LCSW, patient, Group Home Representative [name], and DSS representative Cherylann Ratel to complete screening and meeting for possible placement. All updates regarding meeting will be provided as received. Updates will be provided to patient's Rise Paganini and Mother once available.   Fernande Boyden, LCSW Clinical Social Worker [facility]

## 2023-06-11 NOTE — Group Note (Signed)
LCSW Group Therapy Note  Group Date: 06/11/2023 Start Time: 1110 End Time: 1210   Type of Therapy and Topic:  Group Therapy - Healthy vs Unhealthy Coping Skills  Participation Level:  Did Not Attend   Description of Group The focus of this group was to determine what unhealthy coping techniques typically are used by group members and what healthy coping techniques would be helpful in coping with various problems. Patients were guided in becoming aware of the differences between healthy and unhealthy coping techniques. Patients were asked to identify 2-3 healthy coping skills they would like to learn to use more effectively.  Therapeutic Goals Patients learned that coping is what human beings do all day long to deal with various situations in their lives Patients defined and discussed healthy vs unhealthy coping techniques Patients identified their preferred coping techniques and identified whether these were healthy or unhealthy Patients determined 2-3 healthy coping skills they would like to become more familiar with and use more often. Patients provided support and ideas to each other   Summary of Patient Progress:  Patient did not attend group on today. Patient has been encouraged to participate in all programming on the unit.   Therapeutic Modalities Cognitive Behavioral Therapy Motivational Interviewing  Loleta Dicker, LCSW 06/11/2023  2:22 PM

## 2023-06-11 NOTE — Group Note (Signed)
Recreation Therapy Group Note   Group Topic:Animal Assisted Therapy   Group Date: 06/11/2023 Start Time: 0935 End Time: 1033 Facilitators: Ukiah Trawick-McCall, LRT,CTRS Location: [address]   Animal-Assisted Activity (AAA) Program Checklist/Progress Notes Patient Eligibility Criteria Checklist & Daily Group note for Rec Tx Intervention  AAA/T Program Assumption of Risk Form signed by Patient/ or Parent Legal Guardian Yes  Patient understands his/her participation is voluntary Yes  Education: Charity fundraiser, Appropriate Animal Interaction   Education Outcome: Acknowledges education.    Affect/Mood: N/A   Participation Level: Did not attend    Clinical Observations/Individualized Feedback:     Plan: Continue to engage patient in RT group sessions 2-3x/week.   Syenna Nazir-McCall, LRT,CTRS 06/11/2023 1:08 PM

## 2023-06-12 DIAGNOSIS — F251 Schizoaffective disorder, depressive type: Secondary | ICD-10-CM | POA: Diagnosis not present

## 2023-06-12 NOTE — Group Note (Signed)
Recreation Therapy Group Note   Group Topic:Team Building  Group Date: 06/12/2023 Start Time: 0930 End Time: 0952 Facilitators: Rowan Blaker-McCall, LRT,CTRS Location: [address]   Group Topic: Communication, Team Building, Problem Solving  Goal Area(s) Addresses:  Patient will effectively work with peer towards shared goal.  Patient will identify skills used to make activity successful.  Patient will share challenges and verbalize solution-driven approaches used. Patient will identify how skills used during activity can be used to reach post d/c goals.   Intervention: STEM Activity   Group Description: [employer]. Patients were provided the following materials: 4 drinking straws, 5 rubber bands, 5 paper clips, 2 index cards and 2 drinking cups. Using the provided materials patients were asked to build a launching mechanism to launch a ping pong ball across the room, approximately 10 feet. Patients were divided into teams of 3-5. Instructions required all materials be incorporated into the device, functionality of items left to the peer group's discretion.  Education: Pharmacist, community, Scientist, physiological, Air cabin crew, Building control surveyor.   Education Outcome: Acknowledges education/In group clarification offered/Needs additional education.    Affect/Mood: N/A   Participation Level: Did not attend    Clinical Observations/Individualized Feedback:      Plan: Continue to engage patient in RT group sessions 2-3x/week.   Wylodean Shimmel-McCall, LRT,CTRS 06/12/2023 11:33 AM

## 2023-06-12 NOTE — Plan of Care (Signed)
  Problem: Education: Goal: Knowledge of the prescribed therapeutic regimen will improve Outcome: Progressing   Problem: Activity: Goal: Imbalance in normal sleep/wake cycle will improve Outcome: Progressing   Problem: Health Behavior/Discharge Planning: Goal: Ability to make decisions will improve Outcome: Progressing   Problem: Role Relationship: Goal: Will demonstrate positive changes in social behaviors and relationships Outcome: Progressing

## 2023-06-12 NOTE — Progress Notes (Signed)
   06/12/23 1300  Psych Admission Type (Psych Patients Only)  Admission Status Involuntary  Psychosocial Assessment  Patient Complaints Sadness  Eye Contact Fair  Facial Expression Sad  Affect Anxious;Depressed  Speech Soft  Interaction Childlike  Motor Activity Slow  Appearance/Hygiene Disheveled  Behavior Characteristics Cooperative  Mood Depressed  Thought Process  Coherency WDL  Content WDL  Delusions None reported or observed  Perception WDL  Hallucination None reported or observed  Judgment Impaired  Confusion None  Danger to Self  Current suicidal ideation? Denies  Self-Injurious Behavior No self-injurious ideation or behavior indicators observed or expressed   Agreement Not to Harm Self Yes  Description of Agreement verbal  Danger to Others  Danger to Others None reported or observed

## 2023-06-12 NOTE — Discharge Planning (Signed)
LCSW met with patient on today to discuss disposition plans. Plan was to meet with the patient, Group Home Representative [name], DSS representative Cherylann Ratel, and LCSW Supervisor regarding plan of care. Those present in meeting were LCSW, LCSW Supervisor [name], patient, and DSS representative Cherylann Ratel at 2:15 PM. Group Home Representative did not present to meeting even after waiting until 2:45pm. LCSW made multiple attempts to get in contact with Group Home Representative, however had no success. LCSW left voice messages requesting phone call back.  LCSW dismissed meeting and continued to speak with patient regarding feelings and current needs. Patient reported appreciation of LCSW assistance and inquired why Group Home Representative did not present on today.  LCSW provided brief supportive counseling to the patient and she was receptive to the feedback provided. No other needs were reported at this time by patient. LCSW walked patient back to her room and informed her that LCSW will follow up with updates on tomorrow as received.  LCSW was notified by staff that patient's Guardian Ad Litem was present at the front desk.  LCSW went and spoke with the Guardian Ad Litem Wynonia Hazard [phone] regarding needs.  Mr. Nedra Hai provided petition regarding competence and application for guardianship paperwork to LCSW.  Mr. Nedra Hai explored whether or not the patient has been served by the sheriff regarding this paperwork.  Additional support was requested by colleague Brayton Layman regarding question. LCSW also contacted DSS Representative Cherylann Ratel to verify identity of GAL. GAL confirmed and was reported to be appointed to assess the patient as needed. GAL met with patient and MD Hill to complete competency evaluation on patient asking basic demographic information. Guardian Ad Litem informed the patient that she would not be able to remain in the hospital indefinitely.  GAL informed the patient that  he will be in contact with the patient's family to assess and discuss next steps. GAL explored if patient had any other questions regarding process. No questions reported by patient.  Guardian Ad Litem continued to speak with Dr. Loleta Chance to complete evaluation.  LCSW walked the patient back to her room.  Patient expressed appreciation of help on today.  Patient made aware that LCSW was able to get in contact with Group Home Representative [name], who reports she will be here to see the patient on tomorrow 06/13/2023 at 12:30 PM.  Ms. Earlene Plater reports that she will also have a staff member present with her by the name of Mr. Tiburcio Pea. Patient aware of plan and aware that LCSW will follow up on tomorrow at 12:15pm to assist her with meeting. No other needs were reported at this time.   Guardian Ad Litem paperwork has been placed in the patient's chart.  No other needs to report at this time. LCSW will continue to follow and provide support to patient while in hospital.  Fernande Boyden, LCSW Clinical Social Worker [place] Ph: [phone]

## 2023-06-12 NOTE — Group Note (Signed)
Date:  06/12/2023 Time:  9:08 PM  Group Topic/Focus:  Narcotics Anonymous (NA) Meeting    Participation Level:  Did Not Attend  Participation Quality:   N/A  Affect:   N/A  Cognitive:   N/A  Insight: None  Engagement in Group:   N/A  Modes of Intervention:   N/A  Additional Comments:  Patient did not attend NA Meeting  Kennieth Francois 06/12/2023, 9:08 PM

## 2023-06-12 NOTE — Group Note (Signed)
Date:  06/12/2023 Time:  2:40 PM  Group Topic/Focus:  Dimensions of Wellness:   The focus of this group is to introduce the topic of wellness and discuss the role each dimension of wellness plays in total health.    Participation Level:  Did Not Attend  Participation Quality:   n/a  Affect:   n/a  Cognitive:   n/a  Insight: None  Engagement in Group:   n/a  Modes of Intervention:   n/a  Additional Comments:   Pt did not attend the Social Wellness group.  Edmund Hilda Alexandrina Fiorini 06/12/2023, 2:40 PM

## 2023-06-12 NOTE — [facility] Group Notes (Signed)
[facility] Group Notes:  (Nursing/MHT/Case Management/Adjunct)  Date:  06/12/2023  Time:  2000  Type of Therapy:   Wrap up group  Participation Level:  Active  Participation Quality:  Appropriate, Attentive, Sharing, and Supportive  Affect:  Appropriate  Cognitive:  Lacking  Insight:  Limited  Engagement in Group:  Engaged  Modes of Intervention:  Clarification, Education, and Support  Summary of Progress/Problems: Positive thinking and positive change were discussed.   Yolanda [name] S 06/12/2023, 9:05 PM

## 2023-06-12 NOTE — Progress Notes (Signed)
[name] MD Progress Note  06/12/2023 11:13 AM Yolanda [name]  MRN:  [id number]  Principal Problem: Schizoaffective disorder, depressive type (HCC) Diagnosis: Principal Problem:   Schizoaffective disorder, depressive type (HCC) Active Problems:   GERD (gastroesophageal reflux disease)   Intellectual disability   Tobacco use disorder  Reason for admission: Patient is a  47y.o. female with h/o Schizoaffective disorder, who presents to the [facility] unit due to suicidal ideations with a plan to stab self.  Today's assessment notes:   On assessment today, patient is alert, calm and oriented to person, place, time, and situation.  Mood is less depressed and congruent with affect.  She reports she may be going to a group home called [facility] in [place].  Added that some staff from the group home is coming today at 1:00 together with DSS to assess her for placement.  Presented pleasant and smiling while sharing this information with this provider.  She reports that she had an unwitnessed fall in her room last night. Complaint of her left ankle pain of 4/10.  Left ankle without bruising, swelling, and moved easily with good range of motion and circulation.  Patient able to bear weight and ambulating in and out of the room.  Patient reports that her plan today is to get ready for this 'big' meeting.  Reports that anxiety is at manageable level Sleep is good.  Nursing staff report patient sleeping 8.25 hours last night Appetite is very good and patient is consuming all her meals Concentration is improving Energy level is adequate Denies suicidal thoughts.  Denies suicidal intent or plan.  Denies having any HI.  Denies having psychotic symptoms.   Denies having side effects to current psychiatric medications.   We discussed compliance to current medication regimen  Labs: no new results for review.  Total Time spent with patient: 35 minutes  Past Psychiatric History: see H&P  Past Medical  History:  Past Medical History:  Diagnosis Date   Bipolar affect, depressed (HCC)    Constipation 08/17/2022   Depression    Falls 07/21/2022   Fracture of femoral neck, right, closed (HCC) 01/17/2022   Herpes simplex 08/22/2017   Open fracture dislocation of right elbow joint 01/17/2022    Past Surgical History:  Procedure Laterality Date   NO PAST SURGERIES     SALPINGECTOMY     Family History: History reviewed. No pertinent family history. Family Psychiatric  History: see H&P Social History:  Social History   Substance and Sexual Activity  Alcohol Use Yes     Social History   Substance and Sexual Activity  Drug Use Yes   Types: Cocaine, Marijuana    Social History   Socioeconomic History   Marital status: Single    Spouse name: Not on file   Number of children: Not on file   Years of education: Not on file   Highest education level: Not on file  Occupational History   Not on file  Tobacco Use   Smoking status: Every Day   Smokeless tobacco: Not on file  Substance and Sexual Activity   Alcohol use: Yes   Drug use: Yes    Types: Cocaine, Marijuana   Sexual activity: Yes  Other Topics Concern   Not on file  Social History Narrative   Not on file   Social Determinants of Health   Financial Resource Strain: Low Risk  (09/12/2022)   Received from [facility], [facility]   Overall Financial Resource Strain (CARDIA)  Difficulty of Paying Living Expenses: Not hard at all  Food Insecurity: Patient Declined (12/20/2022)   Hunger Vital Sign    Worried About Running Out of Food in the Last Year: Patient declined    Ran Out of Food in the Last Year: Patient declined  Transportation Needs: No Transportation Needs (12/20/2022)   PRAPARE - Administrator, Civil Service (Medical): No    Lack of Transportation (Non-Medical): No  Physical Activity: Not on file  Stress: No Stress Concern Present (07/17/2022)   Received from [facility], [facility] of Occupational Health - Occupational Stress Questionnaire    Feeling of Stress : Not at all  Social Connections: Unknown (07/16/2022)   Received from [facility], [facility]   Social Network    Social Network: Not on file   Additional Social History:    Sleep: Good  Appetite:  Good  Current Medications: Current Facility-Administered Medications  Medication Dose Route Frequency Provider Last Rate Last Admin   acetaminophen (TYLENOL) tablet 650 mg  650 mg Oral Q6H PRN [name], NP   650 mg at 06/11/23 2244   alum & mag hydroxide-simeth (MAALOX/MYLANTA) 200-200-20 MG/5ML suspension 30 mL  30 mL Oral Q4H PRN Onuoha, Chinwendu V, NP   30 mL at 05/31/23 0801   busPIRone (BUSPAR) tablet 15 mg  15 mg Oral BID [name], NP   15 mg at 06/12/23 0856   cyanocobalamin (VITAMIN B12) injection 1,000 mcg  1,000 mcg Intramuscular Q30 days Sarita Bottom, MD   1,000 mcg at 06/02/23 1129   diphenhydrAMINE (BENADRYL) capsule 50 mg  50 mg Oral TID PRN [name], NP   50 mg at 06/03/23 1110   Or   diphenhydrAMINE (BENADRYL) injection 50 mg  50 mg Intramuscular TID PRN [name], NP       docusate sodium (COLACE) capsule 100 mg  100 mg Oral Daily [name], NP   100 mg at 06/12/23 0855   feeding supplement (ENSURE ENLIVE / ENSURE PLUS) liquid 237 mL  237 mL Oral TID BM Nkwenti, Tyler Aas, NP   237 mL at 06/12/23 1030   haloperidol (HALDOL) tablet 5 mg  5 mg Oral TID PRN [name], NP   5 mg at 06/03/23 1110   Or   haloperidol lactate (HALDOL) injection 5 mg  5 mg Intramuscular TID PRN [name], NP       hydrOXYzine (ATARAX) tablet 25 mg  25 mg Oral TID PRN [name], DO   25 mg at 05/27/23 1146   LORazepam (ATIVAN) tablet 2 mg  2 mg Oral TID PRN [name], NP   2 mg at 06/03/23 1110   Or   LORazepam (ATIVAN) injection 2 mg  2 mg Intramuscular TID PRN [name], NP       magnesium hydroxide (MILK OF MAGNESIA) suspension 30 mL  30 mL Oral  Daily PRN [name], NP   30 mL at 06/10/23 2121   melatonin tablet 5 mg  5 mg Oral QHS Nkwenti, Doris, NP   5 mg at 06/11/23 2136   nicotine polacrilex (NICORETTE) gum 2 mg  2 mg Oral PRN [name], MD   2 mg at 06/11/23 1552   paliperidone (INVEGA) 24 hr tablet 6 mg  6 mg Oral Daily [name], NP   6 mg at 06/11/23 2136   pantoprazole (PROTONIX) EC tablet 40 mg  40 mg Oral Daily Sanjuana Kava, NP  40 mg at 06/12/23 0856   polyethylene glycol (MIRALAX / GLYCOLAX) packet 17 g  17 g Oral Daily PRN [name], MD   17 g at 06/07/23 1847   polyethylene glycol (MIRALAX / GLYCOLAX) packet 17 g  17 g Oral Daily [name], MD   17 g at 06/12/23 0855   sertraline (ZOLOFT) tablet 150 mg  150 mg Oral Daily Massengill, Harrold Donath, MD   150 mg at 06/12/23 0855   SUMAtriptan (IMITREX) tablet 25 mg  25 mg Oral BID PRN [name], NP   25 mg at 05/07/23 2112   traZODone (DESYREL) tablet 50 mg  50 mg Oral QHS [name], NP   50 mg at 06/11/23 2136   Vitamin D (Ergocalciferol) (DRISDOL) 1.25 MG (50000 UNIT) capsule 50,000 Units  50,000 Units Oral Q7 days [name], NP   50,000 Units at 06/08/23 1620   Lab Results: No results found for this or any previous visit (from the past 48 hour(s)).  Blood Alcohol level:  Lab Results  Component Value Date   ETH <10 12/19/2022   ETH <10 11/21/2019    Metabolic Disorder Labs: Lab Results  Component Value Date   HGBA1C 4.4 (L) 12/21/2022   MPG 79.58 12/21/2022   No results found for: "PROLACTIN" Lab Results  Component Value Date   CHOL 218 (H) 12/21/2022   TRIG 81 12/21/2022   HDL 53 12/21/2022   CHOLHDL 4.1 12/21/2022   VLDL 16 12/21/2022   LDLCALC 149 (H) 12/21/2022   LDLCALC 110 (H) 03/13/2011    Physical Findings: AIMS: Facial and Oral Movements Muscles of Facial Expression: None Lips and Perioral Area: None Jaw: None Tongue: None,Extremity Movements Upper (arms, wrists, hands, fingers): None Lower (legs,  knees, ankles, toes): None, Trunk Movements Neck, shoulders, hips: None, Global Judgements Severity of abnormal movements overall : None Incapacitation due to abnormal movements: None Patient's awareness of abnormal movements: No Awareness, Dental Status Current problems with teeth and/or dentures?: No Does patient usually wear dentures?: No  CIWA:    COWS:     Musculoskeletal: Strength & Muscle Tone: within normal limits Gait & Station: normal Patient leans: N/A  Psychiatric Specialty Exam:  Appearance:  AAF, appearing stated age,  wearing appropriate to the situation casual/hospital clothes, with fair grooming and hygiene. Normal level of alertness and appropriate facial expression.  Attitude/Behavior: calm, cooperative, engaging with appropriate eye contact.  Motor: WNL; dyskinesias not evident. Gait appears in full range.  Speech: spontaneous, clear, coherent, normal comprehension.  Mood: euthymic, "okay".  Affect: appropriately-reactive.  Thought process: patient appears coherent, organized, goal-directed, concrete but linear with questions  Thought content: patient denies suicidal thoughts, denies homicidal thoughts; did not express any delusions.  Thought perception: patient denies auditory and visual hallucinations. Did not appear internally stimulated.  Cognition: patient is alert and oriented in self, place, date; with intact attention and concentration.  Insight: limited, in regards of understanding of presence, nature, cause, and significance of mental or emotional problem.  Judgement: limited, in regards of ability to make good decisions concerning the appropriate thing to do in various situations, including ability to form opinions regarding their mental health condition.  Psychomotor Activity  Psychomotor Activity:Psychomotor Activity: Normal  Assets  Assets: Communication Skills; Physical Health; Resilience  Sleep  Sleep:Sleep: Good Number of Hours of  Sleep: 8  Physical Exam: Physical Exam Vitals and nursing note reviewed.  Constitutional:      General: She is not in acute distress.    Appearance:  She is obese. She is not toxic-appearing.  HENT:     Head: Normocephalic.     Nose: Nose normal.     Mouth/Throat:     Mouth: Mucous membranes are moist.  Eyes:     Extraocular Movements: Extraocular movements intact.  Cardiovascular:     Rate and Rhythm: Normal rate.     Pulses: Normal pulses.  Pulmonary:     Effort: Pulmonary effort is normal.  Abdominal:     Comments: Deferred  Genitourinary:    Comments: Deferred Musculoskeletal:     Cervical back: Normal range of motion.  Skin:    General: Skin is warm.  Neurological:     General: No focal deficit present.     Mental Status: She is alert and oriented to person, place, and time.  Psychiatric:        Mood and Affect: Mood normal.        Behavior: Behavior normal.    Review of Systems  Constitutional:  Negative for chills and fever.  HENT:  Negative for sore throat.   Eyes:  Negative for blurred vision.  Respiratory:  Negative for cough, sputum production, shortness of breath and wheezing.   Cardiovascular:  Negative for chest pain and palpitations.  Gastrointestinal:  Negative for abdominal pain, constipation, diarrhea, heartburn, nausea and vomiting.  Genitourinary:  Negative for dysuria, frequency and urgency.  Musculoskeletal:  Negative for back pain, myalgias and neck pain.  Skin:  Negative for itching and rash.  Neurological:  Negative for dizziness, tingling, tremors, sensory change and headaches.  Endo/Heme/Allergies:        See allergy listing  Psychiatric/Behavioral:  Positive for depression. Negative for hallucinations, substance abuse and suicidal ideas. The patient is nervous/anxious. The patient does not have insomnia.    Blood pressure 109/75, pulse 73, temperature 98 F (36.7 C), temperature source Oral, resp. rate 16, height 4\' 11"  (1.499 m), weight  63 kg, SpO2 99%. Body mass index is 28.05 kg/m.  Treatment Plan Summary: Daily contact with patient to assess and evaluate symptoms and progress in treatment and Medication management  Patient is a 48 year old female with the above-stated past psychiatric history who is seen in follow-up.  Chart reviewed. Patient discussed with nursing. Patient appears at her mental baseline. No changes in medicine made today.  Diagnoses/ Active problems: -Schizoaffective disorder -Intellectual disability  PLAN:  Safety and Monitoring: continue inpatient psych admission; 15-minute checks; daily contact with patient to assess and evaluate symptoms and progress in treatment; psychoeducation.Vital signs: q12 hours. Precautions: suicide, elopement, and assault. Placed on room lock out for meals, snacks and groups.  Psychiatric Problems: -Continue Sertraline150 mg po Q daily for depression/anxiety -Continue Buspar 15 mg po bid for anxiety.  -Continue paliperidone 6 mg po qd for mood stabilization -Continue Trazodone 50 mg nightly for insomnia   - Metabolic profile and EKG monitoring obtained while on an atypical antipsychotic (BMI: Lipid Panel: HbgA1c: QTc:)   - Encouraged patient to participate in unit milieu and in scheduled group therapies   Medical Problems: -Continue Imitrex 25 mg PRN BID for migraines -Continue Colace 100 mg daily for constipation -Continue Vitamin D 50.000 units weekly for bone health. -Continue Nicoderm 21 mg topically Q 24 hrs for nicotine withdrawal management. -Continue vitamin B12 1000 mcg IM weekly for 4 weeks then monthly -Continue Melatonin 5 mg nightly for sleep -Continue Protonix EC 40 mg p.o. daily for GERD -Continue MiraLAX 17 g p.o. PRN for constipation. -Continue hydroxyzine 25 mg p.o. 3 times  daily as needed for anxiety -Continue Ensure nutritional shakes TID in between meals  -Previously discontinued Hydroxyzine 50 mg nightly-hypotension in the mornings   -Imodium for diarrhea as needed.  PRN medications:  acetaminophen, alum & mag hydroxide-simeth, diphenhydrAMINE **OR** diphenhydrAMINE, haloperidol **OR** haloperidol lactate, hydrOXYzine, LORazepam **OR** LORazepam, magnesium hydroxide, nicotine polacrilex, polyethylene glycol, SUMAtriptan  Pertinent Labs: no new labs ordered today  Consults: No new consults placed since yesterday    Discharge Planning: -Social work and case management to assist with discharge planning and identification of hospital follow-up needs prior to discharge -Estimated LOS: Unknown at this time -Discharge Concerns: Need to establish a safety plan; Medication compliance and effectiveness -Discharge Goals: Return home with outpatient referrals for mental health follow-up including medication management/psychotherapy Apparently patient now has a legal guardian. Placement is in progress. The patient may not have the option to go to the mother's house unless there is 24/7 supervision.    Cecilie Lowers, FNP 06/12/2023, 11:13 AM Patient ID: Yolanda [name], female   DOB: 03/10/1976, 47 y.o.   MRN: [id number] Patient ID: Yolanda [name], female   DOB: 1976-01-20, 47 y.o.   MRN: [id number] Patient ID: Yolanda [name], female   DOB: Oct 16, 1975, 47 y.o.   MRN: [id number]

## 2023-06-12 NOTE — Progress Notes (Addendum)
Pt reported that she had an unwitnessed fall on the way to the bathroom. Pt states that she slipped while walking towards the bathroom and landed on her bottom on the floor. Pt verbalized that she was wearing nonskid socks with slippers (without non-slip gripper sole) during the incident. Reports no head injury. Verbalized 8/10 left ankle pain. Assessed pt's foot with no swelling, no bleeding, and no abrasions noted. Provided pt with PRN Tylenol and heat pack. Pt noted to have a steady gait upon ambulation. Instructed pt to ambulate carefully and ask staff for assistance when needed. Beverly Milch, NP was notified. AC Kim made aware. Post fall algorithm for no suspected head injury was initiated. Will continue to monitor pt for any increased pain or swelling on left ankle.    06/11/23 2042  What Happened  Was fall witnessed? No  Who witnessed fall? N/A  Patients activity before fall bathroom-unassisted  Point of contact buttocks  Was patient injured? No  Patient found other (Comment) (pt reported fall)  Found by No one-pt stated they fell  Stated prior activity bathroom-unassisted  Provider Notification  Provider Name/Title Beverly Milch, NP  Date Provider Notified 06/11/23  Time Provider Notified 2053  Method of Notification Call  Notification Reason Fall  Provider response No new orders  Date of Provider Response 06/11/23  Time of Provider Response 2055  Follow Up  Family notified No - patient refusal  Simple treatment Other (comment) (PRN medication and heat pack applied)  Progress note created (see row info) Yes  Blank note created Yes  Adult Fall Risk Assessment  Risk Factor Category (scoring not indicated) Fall has occurred during this admission (document High fall risk)  Patient Fall Risk Level High fall risk  Adult Fall Risk Interventions  Required Bundle Interventions *See Row Information* High fall risk - low, moderate, and high requirements implemented  Vitals  Temp  98.1 F (36.7 C)  Temp Source Oral  BP 107/76  MAP (mmHg) 86  BP Location Right Arm  BP Method Automatic  Patient Position (if appropriate) Sitting  Pulse Rate 77  Pulse Rate Source Monitor  Resp 16  Oxygen Therapy  SpO2 99 %  Pain Assessment  Pain Scale 0-10  Pain Score 8  Pain Type Acute pain  Pain Location Foot  Pain Orientation Left  Pain Descriptors / Indicators Aching  Pain Onset On-going  Pain Intervention(s) Medication (See eMAR)  Neurological  Neuro (WDL) X  Level of Consciousness Alert  Cognition Developmentally delayed  Tremors  Tremor Location Hands  Tremor Severity Moderate  Musculoskeletal  Musculoskeletal (WDL) WDL  Assistive Device None  Integumentary  Integumentary (WDL) WDL

## 2023-06-12 NOTE — Progress Notes (Signed)
   06/12/23 0600  15 Minute Checks  Location Bedroom  Visual Appearance Calm  Behavior Sleeping  Sleep (Behavioral Health Patients Only)  Calculate sleep? (Click Yes once per 24 hr at 0600 safety check) Yes  Documented sleep last 24 hours 8.25

## 2023-06-12 NOTE — [facility] Group Notes (Signed)
Spiritual care group facilitated by Chaplain Katy Denessa Cavan, B[facility]  Group focused on topic of strength. Group members reflected on what thoughts and feelings emerge when they hear this topic. They then engaged in facilitated dialog around how strength is present in their lives. This dialog focused on representing what strength had been to them in their lives (images and patterns given) and what they saw as helpful in their life now (what they needed / wanted).  Activity drew on narrative framework.  Patient Progress: Did not attend.  

## 2023-06-13 DIAGNOSIS — F251 Schizoaffective disorder, depressive type: Secondary | ICD-10-CM | POA: Diagnosis not present

## 2023-06-13 MED ORDER — HYDROCORTISONE 1 % EX CREA
TOPICAL_CREAM | Freq: Two times a day (BID) | CUTANEOUS | Status: DC
  Administered 2023-06-16 – 2023-08-06 (×9): 1 via TOPICAL
  Filled 2023-06-13 (×2): qty 28

## 2023-06-13 MED ORDER — WHITE PETROLATUM EX OINT
TOPICAL_OINTMENT | CUTANEOUS | Status: AC
  Filled 2023-06-13: qty 5

## 2023-06-13 NOTE — Discharge Planning (Signed)
LCSW, patient, Group Home Representative [name] at [employer], [employer], [place] representative Thamas Jaegers, and LCSW Supervisor [name] met on today for interview screening for patient. Patient was able to voice her questions and any concerns she had regarding next steps. [facility] Representative Mrs. Earlene Plater informed the patient of her program and requirements within the home.  Group Home encourages family engagement, outings, each resident is required to go to a Day Program and participate in a plethora of other activities. Patient reported agreement with the requirements and stated "Can I be discharge today"? Brief supportive counseling was provided to the patient and patient was receptive to the feedback provided. Group Home Representative informed Leslieanne that she would be a great fit for the home and she would like to accept her. Patient expressed appreciation and excitement regarding decision. No other needs were reported at this time. Patient was escorted back to her room, and LCSW continued conversation with parties involved.   Next steps is for the team to contact [facility] regarding expediting the process for admission into the Group Home. This process is stated to take 3-4 days. A discharge date has been established for the patient and the plan is to discharge the patient by next Friday June 21, 2023. An email has been sent to the [facility] Provider requesting movement forward on this process. All updates will be provided as received.    Information provided by DSS representative Thamas Jaegers on today: Patient's legal hearing with [facility] regarding Herbie Drape has been scheduled for June 20, 2023. Per Charlcie Cradle, patient will likely be served by this time. All updates will be provided as received.  No further needs to report at this time. LCSW will continue to follow and provide support to patient while in the hospital.   Fernande Boyden, LCSW Clinical Social Worker [facility] Ph: [phone]

## 2023-06-13 NOTE — Progress Notes (Signed)
   06/13/23 1000  Psych Admission Type (Psych Patients Only)  Admission Status Involuntary  Psychosocial Assessment  Patient Complaints None  Eye Contact Fair  Facial Expression Sad  Affect Depressed  Speech Soft;Slow;Slurred  Interaction Childlike  Motor Activity Slow;Unsteady  Appearance/Hygiene Disheveled  Behavior Characteristics Cooperative;Appropriate to situation  Mood Depressed;Sad  Thought Process  Coherency WDL  Content WDL  Delusions None reported or observed  Perception WDL  Hallucination None reported or observed  Judgment Impaired  Confusion None  Danger to Self  Current suicidal ideation? Denies  Agreement Not to Harm Self Yes  Description of Agreement verbal  Danger to Others  Danger to Others None reported or observed

## 2023-06-13 NOTE — Group Note (Signed)
LCSW Group Therapy Note  Group Date: 06/13/2023 Start Time: 1100 End Time: 1200   Type of Therapy and Topic:  Group Therapy - Healthy vs Unhealthy Coping Skills  Participation Level:  Active   Description of Group The focus of this group was to determine what unhealthy coping techniques typically are used by group members and what healthy coping techniques would be helpful in coping with various problems. Patients were guided in becoming aware of the differences between healthy and unhealthy coping techniques. Patients were asked to identify 2-3 healthy coping skills they would like to learn to use more effectively.  Therapeutic Goals Patients learned that coping is what human beings do all day long to deal with various situations in their lives Patients defined and discussed healthy vs unhealthy coping techniques Patients identified their preferred coping techniques and identified whether these were healthy or unhealthy Patients determined 2-3 healthy coping skills they would like to become more familiar with and use more often. Patients provided support and ideas to each other   Summary of Patient Progress:  During group, Yolanda [name] expressed ways to cope. Patient proved open to input from peers and feedback from CSW. Patient demonstrated good insight into the subject matter, was respectful of peers, and participated throughout the entire session.   Therapeutic Modalities Cognitive Behavioral Therapy Motivational Interviewing  Yolanda [name], [place] 06/13/2023  12:15 PM

## 2023-06-13 NOTE — Group Note (Signed)
Date:  06/14/2023 Time:  2:05 AM  Group Topic/Focus:  Wrap-Up Group:   The focus of this group is to help patients review their daily goal of treatment and discuss progress on daily workbooks.    Participation Level:  Active  Participation Quality:  Appropriate and Sharing  Affect:  Appropriate and Excited  Cognitive:  Appropriate  Insight: Appropriate  Engagement in Group:  Engaged  Modes of Intervention:  Activity and Socialization  Additional Comments:  Patient stated that she is "excited because she will be discharged on next week". Patient stated that she interacted with others on the unit throughout the day and in the gym. Patient also shared that she spoke with her family and stated that they are excited about her coming discharge. Patient did share brief about relationship with brother. Patient rated her day a 9/10. Patient participated in activity after sharing.   Kennieth Francois 06/14/2023, 2:05 AM

## 2023-06-13 NOTE — Plan of Care (Signed)
  Problem: Education: Goal: Knowledge of the prescribed therapeutic regimen will improve Outcome: Progressing   Problem: Activity: Goal: Imbalance in normal sleep/wake cycle will improve Outcome: Progressing   Problem: Health Behavior/Discharge Planning: Goal: Ability to make decisions will improve Outcome: Progressing   Problem: Role Relationship: Goal: Will demonstrate positive changes in social behaviors and relationships Outcome: Progressing

## 2023-06-13 NOTE — Progress Notes (Signed)
[facility] MD Progress Note  06/13/2023 10:46 AM Yolanda [name]  MRN:  [id number]  Principal Problem: Schizoaffective disorder, depressive type (HCC) Diagnosis: Principal Problem:   Schizoaffective disorder, depressive type (HCC) Active Problems:   GERD (gastroesophageal reflux disease)   Intellectual disability   Tobacco use disorder  Reason for admission: Patient is a  47y.o. female with h/o Schizoaffective disorder, who presents to the [facility] unit due to suicidal ideations with a plan to stab self.  Today's assessment notes:   Patient is alert, and oriented to person, place, time, and situation.  She reports being anxious with depressed mood as a result of the meeting she had yesterday with the Guardian Ad Litem, the social workers, and the psychiatrist.  She reports she is not certain what will happen to her discharge process, however, she is still hopeful that the group home meeting with the [facility] rescheduled for today will be productive for her placement there upon discharge from the hospital.  She denies acute discomfort to the left ankle status post unwitnessed fall.  Left ankle without bruising, swelling, and, moved easily with good range of motion and circulation.  Patient able to bear weight and ambulating in and out of the room.  Patient reports that her plan today is to get ready for the meeting with the group home staff that are coming a 12:30 today.  Patient able to perform her ADLs with encouragement.  Encouraged patient to attend therapeutic milieu and unit group activities as this has proven to improve patient's mood.  Reports that anxiety is at #4/10, with 10 being high severity Sleep is good.  Nursing staff report patient sleeping 9.75 hours last night and being restful Patient reports good appetite Concentration is improving. Energy level is adequate She denies SI, HI, or AVH. Denies having side effects to current psychiatric medications.   We discussed compliance to  current medication regimen  Labs: no new results for review.  Total Time spent with patient: 35 minutes  Past Psychiatric History: see H&P  Past Medical History:  Past Medical History:  Diagnosis Date   Bipolar affect, depressed (HCC)    Constipation 08/17/2022   Depression    Falls 07/21/2022   Fracture of femoral neck, right, closed (HCC) 01/17/2022   Herpes simplex 08/22/2017   Open fracture dislocation of right elbow joint 01/17/2022    Past Surgical History:  Procedure Laterality Date   NO PAST SURGERIES     SALPINGECTOMY     Family History: History reviewed. No pertinent family history. Family Psychiatric  History: see H&P Social History:  Social History   Substance and Sexual Activity  Alcohol Use Yes     Social History   Substance and Sexual Activity  Drug Use Yes   Types: Cocaine, Marijuana    Social History   Socioeconomic History   Marital status: Single    Spouse name: Not on file   Number of children: Not on file   Years of education: Not on file   Highest education level: Not on file  Occupational History   Not on file  Tobacco Use   Smoking status: Every Day   Smokeless tobacco: Not on file  Substance and Sexual Activity   Alcohol use: Yes   Drug use: Yes    Types: Cocaine, Marijuana   Sexual activity: Yes  Other Topics Concern   Not on file  Social History Narrative   Not on file   Social Determinants of Health   Financial  Resource Strain: Low Risk  (09/12/2022)   Received from [facility], [facility]   Overall Financial Resource Strain (CARDIA)    Difficulty of Paying Living Expenses: Not hard at all  Food Insecurity: Patient Declined (12/20/2022)   Hunger Vital Sign    Worried About Running Out of Food in the Last Year: Patient declined    Ran Out of Food in the Last Year: Patient declined  Transportation Needs: No Transportation Needs (12/20/2022)   PRAPARE - Administrator, Civil Service (Medical): No    Lack of  Transportation (Non-Medical): No  Physical Activity: Not on file  Stress: No Stress Concern Present (07/17/2022)   Received from [facility], [facility] - Occupational Stress Questionnaire    Feeling of Stress : Not at all  Social Connections: Unknown (07/16/2022)   Received from [facility], [facility]   Social Network    Social Network: Not on file   Additional Social History:    Sleep: Good  Appetite:  Good  Current Medications: Current Facility-Administered Medications  Medication Dose Route Frequency Provider Last Rate Last Admin   acetaminophen (TYLENOL) tablet 650 mg  650 mg Oral Q6H PRN [name], NP   650 mg at 06/12/23 1159   alum & mag hydroxide-simeth (MAALOX/MYLANTA) 200-200-20 MG/5ML suspension 30 mL  30 mL Oral Q4H PRN Onuoha, Chinwendu V, NP   30 mL at 05/31/23 0801   busPIRone (BUSPAR) tablet 15 mg  15 mg Oral BID [name], NP   15 mg at 06/13/23 0747   cyanocobalamin (VITAMIN B12) injection 1,000 mcg  1,000 mcg Intramuscular Q30 days Sarita Bottom, MD   1,000 mcg at 06/02/23 1129   diphenhydrAMINE (BENADRYL) capsule 50 mg  50 mg Oral TID PRN [name], NP   50 mg at 06/03/23 1110   Or   diphenhydrAMINE (BENADRYL) injection 50 mg  50 mg Intramuscular TID PRN [name], NP       docusate sodium (COLACE) capsule 100 mg  100 mg Oral Daily [name], NP   100 mg at 06/13/23 0747   feeding supplement (ENSURE ENLIVE / ENSURE PLUS) liquid 237 mL  237 mL Oral TID BM [name], NP   237 mL at 06/13/23 1006   haloperidol (HALDOL) tablet 5 mg  5 mg Oral TID PRN [name], NP   5 mg at 06/03/23 1110   Or   haloperidol lactate (HALDOL) injection 5 mg  5 mg Intramuscular TID PRN [name], NP       hydrOXYzine (ATARAX) tablet 25 mg  25 mg Oral TID PRN [name], DO   25 mg at 05/27/23 1146   LORazepam (ATIVAN) tablet 2 mg  2 mg Oral TID PRN [name], NP   2 mg at 06/03/23 1110   Or    LORazepam (ATIVAN) injection 2 mg  2 mg Intramuscular TID PRN [name], NP       magnesium hydroxide (MILK OF MAGNESIA) suspension 30 mL  30 mL Oral Daily PRN [name], NP   30 mL at 06/10/23 2121   melatonin tablet 5 mg  5 mg Oral QHS Nkwenti, Doris, NP   5 mg at 06/12/23 2119   nicotine polacrilex (NICORETTE) gum 2 mg  2 mg Oral PRN [name], MD   2 mg at 06/12/23 1547   paliperidone (INVEGA) 24 hr tablet 6 mg  6 mg Oral Daily [name], NP  6 mg at 06/12/23 2119   pantoprazole (PROTONIX) EC tablet 40 mg  40 mg Oral Daily Nwoko, Nicole Kindred I, NP   40 mg at 06/13/23 0747   polyethylene glycol (MIRALAX / GLYCOLAX) packet 17 g  17 g Oral Daily PRN [name], MD   17 g at 06/07/23 1847   polyethylene glycol (MIRALAX / GLYCOLAX) packet 17 g  17 g Oral Daily [name], MD   17 g at 06/13/23 0747   sertraline (ZOLOFT) tablet 150 mg  150 mg Oral Daily Massengill, Harrold Donath, MD   150 mg at 06/13/23 0747   SUMAtriptan (IMITREX) tablet 25 mg  25 mg Oral BID PRN [name], NP   25 mg at 05/07/23 2112   traZODone (DESYREL) tablet 50 mg  50 mg Oral QHS [name], NP   50 mg at 06/12/23 2119   Vitamin D (Ergocalciferol) (DRISDOL) 1.25 MG (50000 UNIT) capsule 50,000 Units  50,000 Units Oral Q7 days [name], NP   50,000 Units at 06/08/23 1620   Lab Results: No results found for this or any previous visit (from the past 48 hour(s)).  Blood Alcohol level:  Lab Results  Component Value Date   ETH <10 12/19/2022   ETH <10 11/21/2019    Metabolic Disorder Labs: Lab Results  Component Value Date   HGBA1C 4.4 (L) 12/21/2022   MPG 79.58 12/21/2022   No results found for: "PROLACTIN" Lab Results  Component Value Date   CHOL 218 (H) 12/21/2022   TRIG 81 12/21/2022   HDL 53 12/21/2022   CHOLHDL 4.1 12/21/2022   VLDL 16 12/21/2022   LDLCALC 149 (H) 12/21/2022   LDLCALC 110 (H) 03/13/2011    Physical Findings: AIMS: Facial and Oral Movements Muscles of  Facial Expression: None Lips and Perioral Area: None Jaw: None Tongue: None,Extremity Movements Upper (arms, wrists, hands, fingers): None Lower (legs, knees, ankles, toes): None, Trunk Movements Neck, shoulders, hips: None, Global Judgements Severity of abnormal movements overall : None Incapacitation due to abnormal movements: None Patient's awareness of abnormal movements: No Awareness, Dental Status Current problems with teeth and/or dentures?: No Does patient usually wear dentures?: No  CIWA:    COWS:     Musculoskeletal: Strength & Muscle Tone: within normal limits Gait & Station: normal Patient leans: N/A  Psychiatric Specialty Exam:  Appearance:  AAF, appearing stated age,  wearing appropriate to the situation casual/hospital clothes, with fair grooming and hygiene. Normal level of alertness and appropriate facial expression.  Attitude/Behavior: calm, cooperative, engaging with appropriate eye contact.  Motor: WNL; dyskinesias not evident. Gait appears in full range.  Speech: spontaneous, clear, coherent, normal comprehension.  Mood: euthymic, "okay".  Affect: appropriately-reactive.  Thought process: patient appears coherent, organized, goal-directed, concrete but linear with questions  Thought content: patient denies suicidal thoughts, denies homicidal thoughts; did not express any delusions.  Thought perception: patient denies auditory and visual hallucinations. Did not appear internally stimulated.  Cognition: patient is alert and oriented in self, place, date; with intact attention and concentration.  Insight: limited, in regards of understanding of presence, nature, cause, and significance of mental or emotional problem.  Judgement: limited, in regards of ability to make good decisions concerning the appropriate thing to do in various situations, including ability to form opinions regarding their mental health condition.  Psychomotor Activity  Psychomotor  Activity:Psychomotor Activity: Normal  Assets  Assets: Communication Skills; Physical Health; Resilience  Sleep  Sleep:Sleep: Good Number of Hours of Sleep: 9.75  Physical Exam: Physical  Exam Vitals and nursing note reviewed.  Constitutional:      General: She is not in acute distress.    Appearance: She is obese. She is not toxic-appearing.  HENT:     Head: Normocephalic.     Nose: Nose normal.     Mouth/Throat:     Mouth: Mucous membranes are moist.  Eyes:     Extraocular Movements: Extraocular movements intact.  Cardiovascular:     Rate and Rhythm: Normal rate.     Pulses: Normal pulses.  Pulmonary:     Effort: Pulmonary effort is normal.  Abdominal:     Comments: Deferred  Genitourinary:    Comments: Deferred Musculoskeletal:     Cervical back: Normal range of motion.  Skin:    General: Skin is warm.  Neurological:     General: No focal deficit present.     Mental Status: She is alert and oriented to person, place, and time.  Psychiatric:        Mood and Affect: Mood normal.        Behavior: Behavior normal.    Review of Systems  Constitutional:  Negative for chills and fever.  HENT:  Negative for sore throat.   Eyes:  Negative for blurred vision.  Respiratory:  Negative for cough, sputum production, shortness of breath and wheezing.   Cardiovascular:  Negative for chest pain and palpitations.  Gastrointestinal:  Negative for abdominal pain, constipation, diarrhea, heartburn, nausea and vomiting.  Genitourinary:  Negative for dysuria, frequency and urgency.  Musculoskeletal:  Negative for back pain, myalgias and neck pain.  Skin:  Negative for itching and rash.  Neurological:  Negative for dizziness, tingling, tremors, sensory change and headaches.  Endo/Heme/Allergies:        See allergy listing  Psychiatric/Behavioral:  Positive for depression. Negative for hallucinations, substance abuse and suicidal ideas. The patient is nervous/anxious. The patient  does not have insomnia.    Blood pressure 119/85, pulse 84, temperature 98.3 F (36.8 C), temperature source Oral, resp. rate 16, height 4\' 11"  (1.499 m), weight 63 kg, SpO2 96%. Body mass index is 28.05 kg/m.  Treatment Plan Summary: Daily contact with patient to assess and evaluate symptoms and progress in treatment and Medication management  Patient is a 47 year old female with the above-stated past psychiatric history who is seen in follow-up.  Chart reviewed. Patient discussed with nursing. Patient appears at her mental baseline. No changes in medicine made today.  Diagnoses/ Active problems: -Schizoaffective disorder -Intellectual disability  PLAN:  Safety and Monitoring: continue inpatient psych admission; 15-minute checks; daily contact with patient to assess and evaluate symptoms and progress in treatment; psychoeducation.Vital signs: q12 hours. Precautions: suicide, elopement, and assault. Placed on room lock out for meals, snacks and groups.  Psychiatric Problems: -Continue Sertraline150 mg po Q daily for depression/anxiety -Continue Buspar 15 mg po bid for anxiety.  -Continue paliperidone 6 mg po qd for mood stabilization -Continue Trazodone 50 mg nightly for insomnia   - Metabolic profile and EKG monitoring obtained while on an atypical antipsychotic (BMI: Lipid Panel: HbgA1c: QTc:)   - Encouraged patient to participate in unit milieu and in scheduled group therapies   Medical Problems: -Continue Imitrex 25 mg PRN BID for migraines -Continue Colace 100 mg daily for constipation -Continue Vitamin D 50.000 units weekly for bone health. -Continue Nicoderm 21 mg topically Q 24 hrs for nicotine withdrawal management. -Continue vitamin B12 1000 mcg IM weekly for 4 weeks then monthly -Continue Melatonin 5 mg nightly for sleep -  Continue Protonix EC 40 mg p.o. daily for GERD -Continue MiraLAX 17 g p.o. PRN for constipation. -Continue hydroxyzine 25 mg p.o. 3 times daily as  needed for anxiety -Continue Ensure nutritional shakes TID in between meals  -Previously discontinued Hydroxyzine 50 mg nightly-hypotension in the mornings  -Imodium for diarrhea as needed.  PRN medications:  acetaminophen, alum & mag hydroxide-simeth, diphenhydrAMINE **OR** diphenhydrAMINE, haloperidol **OR** haloperidol lactate, hydrOXYzine, LORazepam **OR** LORazepam, magnesium hydroxide, nicotine polacrilex, polyethylene glycol, SUMAtriptan  Pertinent Labs: no new labs ordered today  Consults: No new consults placed since yesterday    Discharge Planning: -Social work and case management to assist with discharge planning and identification of hospital follow-up needs prior to discharge -Estimated LOS: Unknown at this time -Discharge Concerns: Need to establish a safety plan; Medication compliance and effectiveness -Discharge Goals: Return home with outpatient referrals for mental health follow-up including medication management/psychotherapy Apparently patient now has a legal guardian. Placement is in progress. The patient may not have the option to go to the mother's house unless there is 24/7 supervision.  06/13/2023: LCSW, patient, and Guardian Ad Litem had a meeting yesterday regarding patient discharge yesterday 06/12/2023.  Please see social worker's notes.  Cecilie Lowers, FNP 06/13/2023, 10:46 AM Patient ID: Yolanda [name], female   DOB: [birth year], 47 y.o.   MRN: [id number] Patient ID: Yolanda [name], female   DOB: 05/04/1976, 47 y.o.   MRN: [id number] Patient ID: Yolanda [name], female   DOB: 1975-07-24, 47 y.o.   MRN: [id number] Patient ID: Yolanda [name], female   DOB: 05-19-1976, 47 y.o.   MRN: [id number] Will be productive

## 2023-06-14 ENCOUNTER — Encounter (HOSPITAL_COMMUNITY): Payer: Self-pay

## 2023-06-14 DIAGNOSIS — F251 Schizoaffective disorder, depressive type: Secondary | ICD-10-CM | POA: Diagnosis not present

## 2023-06-14 NOTE — Discharge Planning (Signed)
LCSW received email from [facility] that the [facility] Representative would have to submit a Treatment Authorization Request (TAR) stating the patient has been in inpatient for 176 days so that the expedited reviewer can assist with getting the patient discharged.   This information was provided to the [facility] Representative [name] who reports she will submit paperwork on today and then follow up with team once she has completed it. Diagnosis codes provided as requested. No other needs to report at this time.   LCSW contacted the patient on the adult unit to assess current mental state and progress. Patient reports she is doing "okay" today. Patient reports she did have some intrusive thoughts, however she did not plan to act on those thoughts. Patient reports she is unsure what triggers the thoughts. Patient asked if she would have to stay in the hospital for 7 more days. LCSW informed patient that team is working together to expedite the process so that discharge can happen soon. Patient reports she is excited about the group home placement, however she wanted to know about her legal hearing. Patient was informed that her legal hearing was postponed to next Thursday per DSS Representative, however more updates will be provided as received. Patient asked "well what happens with my family", and LCSW informed her of her conversation with the Guardian Ad Litem and his plan to contact her family to provide updates. Patient stated "okay". Brief supportive counseling was provided to the patient. No other needs to report at this time.   LCSW will continue to follow and provide support to patient while in hospital.   Fernande Boyden, LCSW Clinical Social Worker [place] Ph: [phone]

## 2023-06-14 NOTE — Progress Notes (Signed)
   06/13/23 2059  Psych Admission Type (Psych Patients Only)  Admission Status Involuntary  Psychosocial Assessment  Patient Complaints None  Eye Contact Fair  Facial Expression Animated  Affect Anxious  Speech Slow;Soft  Interaction Childlike  Motor Activity Unsteady  Appearance/Hygiene Disheveled  Behavior Characteristics Cooperative;Appropriate to situation  Mood Depressed  Aggressive Behavior  Effect No apparent injury  Thought Process  Coherency WDL  Content WDL  Delusions WDL  Perception WDL  Hallucination None reported or observed  Judgment Impaired  Confusion None  Danger to Self  Current suicidal ideation? Denies  Agreement Not to Harm Self Yes  Description of Agreement verbal  Danger to Others  Danger to Others None reported or observed

## 2023-06-14 NOTE — Plan of Care (Signed)
  Problem: Activity: Goal: Interest or engagement in leisure activities will improve Outcome: Progressing Goal: Imbalance in normal sleep/wake cycle will improve Outcome: Progressing   Problem: Coping: Goal: Coping ability will improve Outcome: Progressing   Problem: Health Behavior/Discharge Planning: Goal: Compliance with therapeutic regimen will improve Outcome: Progressing   Problem: Self-Concept: Goal: Will verbalize positive feelings about self Outcome: Progressing Goal: Level of anxiety will decrease Outcome: Progressing   Problem: Coping: Goal: Coping ability will improve Outcome: Progressing Goal: Will verbalize feelings Outcome: Progressing   Problem: Health Behavior/Discharge Planning: Goal: Compliance with therapeutic regimen will improve Outcome: Progressing   Problem: Self-Concept: Goal: Level of anxiety will decrease Outcome: Progressing   Problem: Coping: Goal: Coping ability will improve Outcome: Progressing

## 2023-06-14 NOTE — Progress Notes (Addendum)
Patient denies SI,HI, and A/V/H with no plan or intent. Around midday patient's mood changed to sad and tearful. Patient verbalized some passive SI with "bad thoughts" and patient brought out into dayroom and engaged in activities. Patient also provided with reassurance as she questioned if she was a bad person if she did not read the bible everyday. Patient's mood changed again towards evening time and appeared smiling and more relaxed. Patient med compliant. Patient cooperative on unit and verbalizes no plan or intent in harming herself.    06/14/23 0817  Psych Admission Type (Psych Patients Only)  Admission Status Involuntary  Psychosocial Assessment  Patient Complaints None  Eye Contact Fair  Facial Expression Animated  Affect Anxious  Speech Slow  Interaction Childlike  Motor Activity Slow  Appearance/Hygiene Disheveled  Behavior Characteristics Cooperative;Appropriate to situation  Mood Anxious  Thought Process  Coherency WDL  Content WDL  Delusions None reported or observed  Perception WDL  Hallucination None reported or observed  Judgment Impaired  Confusion None  Danger to Self  Current suicidal ideation? Denies  Self-Injurious Behavior No self-injurious ideation or behavior indicators observed or expressed   Agreement Not to Harm Self Yes  Description of Agreement verbal  Danger to Others  Danger to Others None reported or observed

## 2023-06-14 NOTE — BH IP Treatment Plan (Signed)
Interdisciplinary Treatment and Diagnostic Plan Update  06/14/2023 Time of Session: 12:05PM - UPDATE Yolanda [name] MRN: [id number]  Principal Diagnosis: Schizoaffective disorder, depressive type (HCC)  Secondary Diagnoses: Principal Problem:   Schizoaffective disorder, depressive type (HCC) Active Problems:   GERD (gastroesophageal reflux disease)   Intellectual disability   Tobacco use disorder   Current Medications:  Current Facility-Administered Medications  Medication Dose Route Frequency Provider Last Rate Last Admin   acetaminophen (TYLENOL) tablet 650 mg  650 mg Oral Q6H PRN [name], NP   650 mg at 06/12/23 1159   alum & mag hydroxide-simeth (MAALOX/MYLANTA) 200-200-20 MG/5ML suspension 30 mL  30 mL Oral Q4H PRN Onuoha, Chinwendu V, NP   30 mL at 05/31/23 0801   busPIRone (BUSPAR) tablet 15 mg  15 mg Oral BID [name], NP   15 mg at 06/14/23 0817   cyanocobalamin (VITAMIN B12) injection 1,000 mcg  1,000 mcg Intramuscular Q30 days Sarita Bottom, MD   1,000 mcg at 06/02/23 1129   diphenhydrAMINE (BENADRYL) capsule 50 mg  50 mg Oral TID PRN [name], NP   50 mg at 06/03/23 1110   Or   diphenhydrAMINE (BENADRYL) injection 50 mg  50 mg Intramuscular TID PRN [name], NP       docusate sodium (COLACE) capsule 100 mg  100 mg Oral Daily Nkwenti, Doris, NP   100 mg at 06/14/23 0817   feeding supplement (ENSURE ENLIVE / ENSURE PLUS) liquid 237 mL  237 mL Oral TID BM Nkwenti, Doris, NP   237 mL at 06/14/23 1100   haloperidol (HALDOL) tablet 5 mg  5 mg Oral TID PRN [name], NP   5 mg at 06/03/23 1110   Or   haloperidol lactate (HALDOL) injection 5 mg  5 mg Intramuscular TID PRN [name], NP       hydrocortisone cream 1 %   Topical BID Cecilie Lowers, FNP   Given at 06/14/23 0818   hydrOXYzine (ATARAX) tablet 25 mg  25 mg Oral TID PRN [name], DO   25 mg at 05/27/23 1146   LORazepam (ATIVAN) tablet 2 mg  2 mg Oral TID PRN [name], NP   2 mg  at 06/03/23 1110   Or   LORazepam (ATIVAN) injection 2 mg  2 mg Intramuscular TID PRN [name], NP       magnesium hydroxide (MILK OF MAGNESIA) suspension 30 mL  30 mL Oral Daily PRN [name], NP   30 mL at 06/10/23 2121   melatonin tablet 5 mg  5 mg Oral QHS Nkwenti, Doris, NP   5 mg at 06/13/23 2158   nicotine polacrilex (NICORETTE) gum 2 mg  2 mg Oral PRN [name], MD   2 mg at 06/14/23 1147   paliperidone (INVEGA) 24 hr tablet 6 mg  6 mg Oral Daily Nkwenti, Tyler Aas, NP   6 mg at 06/13/23 2158   pantoprazole (PROTONIX) EC tablet 40 mg  40 mg Oral Daily Nwoko, Nicole Kindred I, NP   40 mg at 06/14/23 0817   polyethylene glycol (MIRALAX / GLYCOLAX) packet 17 g  17 g Oral Daily PRN [name], MD   17 g at 06/07/23 1847   polyethylene glycol (MIRALAX / GLYCOLAX) packet 17 g  17 g Oral Daily [name], MD   17 g at 06/14/23 0817   sertraline (ZOLOFT) tablet 150 mg  150 mg Oral Daily Massengill, Harrold Donath, MD   150 mg at 06/14/23 [phone]  SUMAtriptan (IMITREX) tablet 25 mg  25 mg Oral BID PRN [name], NP   25 mg at 05/07/23 2112   traZODone (DESYREL) tablet 50 mg  50 mg Oral QHS [name], NP   50 mg at 06/13/23 2158   Vitamin D (Ergocalciferol) (DRISDOL) 1.25 MG (50000 UNIT) capsule 50,000 Units  50,000 Units Oral Q7 days [name], NP   50,000 Units at 06/08/23 1620   PTA Medications: Medications Prior to Admission  Medication Sig Dispense Refill Last Dose   busPIRone (BUSPAR) 15 MG tablet Take 15 mg by mouth 2 (two) times daily. (Patient not taking: Reported on 12/19/2022)      paliperidone (INVEGA SUSTENNA) 156 MG/ML SUSY injection Inject 156 mg into the muscle once. (Patient not taking: Reported on 12/19/2022)      sertraline (ZOLOFT) 50 MG tablet Take 150 mg by mouth daily. (Patient not taking: Reported on 12/19/2022)      traZODone (DESYREL) 100 MG tablet Take 100 mg by mouth at bedtime as needed for sleep. (Patient not taking: Reported on 11/12/2022)        Patient Stressors: Medication change or noncompliance    Patient Strengths: Forensic psychologist fund of knowledge   Treatment Modalities: Medication Management, Group therapy, Case management,  1 to 1 session with clinician, Psychoeducation, Recreational therapy.   Physician Treatment Plan for Primary Diagnosis: Schizoaffective disorder, depressive type (HCC) Long Term Goal(s): Improvement in symptoms so as ready for discharge   Short Term Goals: Ability to identify and develop effective coping behaviors will improve Ability to maintain clinical measurements within normal limits will improve Compliance with prescribed medications will improve Ability to identify triggers associated with substance abuse/mental health issues will improve Ability to identify changes in lifestyle to reduce recurrence of condition will improve Ability to verbalize feelings will improve Ability to disclose and discuss suicidal ideas Ability to demonstrate self-control will improve  Medication Management: Evaluate patient's response, side effects, and tolerance of medication regimen.  Therapeutic Interventions: 1 to 1 sessions, Unit Group sessions and Medication administration.  Evaluation of Outcomes: Progressing  Physician Treatment Plan for Secondary Diagnosis: Principal Problem:   Schizoaffective disorder, depressive type (HCC) Active Problems:   GERD (gastroesophageal reflux disease)   Intellectual disability   Tobacco use disorder  Long Term Goal(s): Improvement in symptoms so as ready for discharge   Short Term Goals: Ability to identify and develop effective coping behaviors will improve Ability to maintain clinical measurements within normal limits will improve Compliance with prescribed medications will improve Ability to identify triggers associated with substance abuse/mental health issues will improve Ability to identify changes in lifestyle to reduce recurrence of  condition will improve Ability to verbalize feelings will improve Ability to disclose and discuss suicidal ideas Ability to demonstrate self-control will improve     Medication Management: Evaluate patient's response, side effects, and tolerance of medication regimen.  Therapeutic Interventions: 1 to 1 sessions, Unit Group sessions and Medication administration.  Evaluation of Outcomes: Progressing   RN Treatment Plan for Primary Diagnosis: Schizoaffective disorder, depressive type (HCC) Long Term Goal(s): Knowledge of disease and therapeutic regimen to maintain health will improve  Short Term Goals: Ability to remain free from injury will improve and Ability to disclose and discuss suicidal ideas  Medication Management: RN will administer medications as ordered by provider, will assess and evaluate patient's response and provide education to patient for prescribed medication. RN will report any adverse and/or side effects to prescribing provider.  Therapeutic Interventions: 1  on 1 counseling sessions, Psychoeducation, Medication administration, Evaluate responses to treatment, Monitor vital signs and CBGs as ordered, Perform/monitor CIWA, COWS, AIMS and Fall Risk screenings as ordered, Perform wound care treatments as ordered.  Evaluation of Outcomes: Progressing   LCSW Treatment Plan for Primary Diagnosis: Schizoaffective disorder, depressive type (HCC) Long Term Goal(s): Safe transition to appropriate next level of care at discharge, Engage patient in therapeutic group addressing interpersonal concerns.  Short Term Goals: Increase social support and Identify triggers associated with mental health/substance abuse issues  Therapeutic Interventions: Assess for all discharge needs, 1 to 1 time with Social worker, Explore available resources and support systems, Assess for adequacy in community support network, Educate family and significant other(s) on suicide prevention, Complete  Psychosocial Assessment, Interpersonal group therapy.  Evaluation of Outcomes: Progressing   Progress in Treatment: Attending groups: Yes. Participating in groups: Yes. Taking medication as prescribed: Yes. Toleration medication: Yes. Family/Significant other contact made: Yes, individual(s) contacted:  Donn Mastin [phone] Patient understands diagnosis: Yes. Discussing patient identified problems/goals with staff: Yes. Medical problems stabilized or resolved: Yes. Denies suicidal/homicidal ideation: Yes. Issues/concerns per patient self-inventory: No. Other: none reported   New problem(s) identified: none reported   New Short Term/Long Term Goal(s): medication stabilization, elimination of SI thoughts, development of comprehensive mental wellness plan.      Patient Goals:  " placement"   Discharge Plan or Barriers: Patient recently admitted. CSW will continue to follow and assess for appropriate referrals and possible discharge planning.      Reason for Continuation of Hospitalization: Depression Suicidal ideation   Estimated Length of Stay: Pt expected to discharge 12/13 to group home  Last 3 [place] Suicide Severity Risk Score: Flowsheet Row Admission (Current) [facility] from 12/19/2022 in [facility] at [facility] ED from 11/12/2022 in [facility] Department at [facility]  C-SSRS RISK CATEGORY High Risk High Risk Moderate Risk       Last [facility] 2/9 Scores:     No data to display          Scribe for Treatment Team: Kathi Der, LCSWA 06/14/2023 1:24 PM

## 2023-06-14 NOTE — Progress Notes (Signed)
[facility] MD Progress Note  06/14/2023 1:11 PM Yolanda [name]  MRN:  [id number] Subjective:  Yolanda [name] was initially seen this morning in her room. At that encounter she was smiling and pleased to have some progress towards discharge. Around the mid part of the day she wanted to talk again and looked sad. She said that she is having intrusive thoughts of wanting to die or be dead. She has no current plan and can contract for safety. She accepts reassurance that she does not have to act on any thought, and that negative thoughts are best managed by distractions.   Principal Problem: Schizoaffective disorder, depressive type (HCC) Diagnosis: Principal Problem:   Schizoaffective disorder, depressive type (HCC) Active Problems:   GERD (gastroesophageal reflux disease)   Intellectual disability   Tobacco use disorder  Total Time spent with patient: 20 minutes  Past Psychiatric History: see H&P  Past Medical History:  Past Medical History:  Diagnosis Date   Bipolar affect, depressed (HCC)    Constipation 08/17/2022   Depression    Falls 07/21/2022   Fracture of femoral neck, right, closed (HCC) 01/17/2022   Herpes simplex 08/22/2017   Open fracture dislocation of right elbow joint 01/17/2022    Past Surgical History:  Procedure Laterality Date   NO PAST SURGERIES     SALPINGECTOMY     Family History: History reviewed. No pertinent family history. Family Psychiatric  History: see H&P Social History:  Social History   Substance and Sexual Activity  Alcohol Use Yes     Social History   Substance and Sexual Activity  Drug Use Yes   Types: Cocaine, Marijuana    Social History   Socioeconomic History   Marital status: Single    Spouse name: Not on file   Number of children: Not on file   Years of education: Not on file   Highest education level: Not on file  Occupational History   Not on file  Tobacco Use   Smoking status: Every Day   Smokeless tobacco: Not on file   Substance and Sexual Activity   Alcohol use: Yes   Drug use: Yes    Types: Cocaine, Marijuana   Sexual activity: Yes  Other Topics Concern   Not on file  Social History Narrative   Not on file   Social Determinants of Health   Financial Resource Strain: Low Risk  (09/12/2022)   Received from [facility], [facility]   Overall Financial Resource Strain (CARDIA)    Difficulty of Paying Living Expenses: Not hard at all  Food Insecurity: Patient Declined (12/20/2022)   Hunger Vital Sign    Worried About Running Out of Food in the Last Year: Patient declined    Ran Out of Food in the Last Year: Patient declined  Transportation Needs: No Transportation Needs (12/20/2022)   PRAPARE - Administrator, Civil Service (Medical): No    Lack of Transportation (Non-Medical): No  Physical Activity: Not on file  Stress: No Stress Concern Present (07/17/2022)   Received from [facility], [facility] of Occupational Health - Occupational Stress Questionnaire    Feeling of Stress : Not at all  Social Connections: Unknown (07/16/2022)   Received from [facility], [facility]   Social Network    Social Network: Not on file   Additional Social History:  Sleep: Good  Appetite:  Good  Current Medications: Current Facility-Administered Medications  Medication Dose Route Frequency Provider Last Rate Last Admin   acetaminophen (TYLENOL) tablet 650 mg  650 mg Oral Q6H PRN [name], NP   650 mg at 06/12/23 1159   alum & mag hydroxide-simeth (MAALOX/MYLANTA) 200-200-20 MG/5ML suspension 30 mL  30 mL Oral Q4H PRN Onuoha, Chinwendu V, NP   30 mL at 05/31/23 0801   busPIRone (BUSPAR) tablet 15 mg  15 mg Oral BID [name], NP   15 mg at 06/14/23 0817   cyanocobalamin (VITAMIN B12) injection 1,000 mcg  1,000 mcg Intramuscular Q30 days Sarita Bottom, MD   1,000 mcg at 06/02/23 1129   diphenhydrAMINE (BENADRYL) capsule 50 mg   50 mg Oral TID PRN [name], NP   50 mg at 06/03/23 1110   Or   diphenhydrAMINE (BENADRYL) injection 50 mg  50 mg Intramuscular TID PRN [name], NP       docusate sodium (COLACE) capsule 100 mg  100 mg Oral Daily Nkwenti, Tyler Aas, NP   100 mg at 06/14/23 0817   feeding supplement (ENSURE ENLIVE / ENSURE PLUS) liquid 237 mL  237 mL Oral TID BM Nkwenti, Doris, NP   237 mL at 06/14/23 1100   haloperidol (HALDOL) tablet 5 mg  5 mg Oral TID PRN [name], NP   5 mg at 06/03/23 1110   Or   haloperidol lactate (HALDOL) injection 5 mg  5 mg Intramuscular TID PRN [name], NP       hydrocortisone cream 1 %   Topical BID Cecilie Lowers, FNP   Given at 06/14/23 0818   hydrOXYzine (ATARAX) tablet 25 mg  25 mg Oral TID PRN [name], DO   25 mg at 05/27/23 1146   LORazepam (ATIVAN) tablet 2 mg  2 mg Oral TID PRN [name], NP   2 mg at 06/03/23 1110   Or   LORazepam (ATIVAN) injection 2 mg  2 mg Intramuscular TID PRN [name], NP       magnesium hydroxide (MILK OF MAGNESIA) suspension 30 mL  30 mL Oral Daily PRN [name], NP   30 mL at 06/10/23 2121   melatonin tablet 5 mg  5 mg Oral QHS Nkwenti, Doris, NP   5 mg at 06/13/23 2158   nicotine polacrilex (NICORETTE) gum 2 mg  2 mg Oral PRN [name], MD   2 mg at 06/14/23 1147   paliperidone (INVEGA) 24 hr tablet 6 mg  6 mg Oral Daily Nkwenti, Tyler Aas, NP   6 mg at 06/13/23 2158   pantoprazole (PROTONIX) EC tablet 40 mg  40 mg Oral Daily Nwoko, Nicole Kindred I, NP   40 mg at 06/14/23 0817   polyethylene glycol (MIRALAX / GLYCOLAX) packet 17 g  17 g Oral Daily PRN [name], MD   17 g at 06/07/23 1847   polyethylene glycol (MIRALAX / GLYCOLAX) packet 17 g  17 g Oral Daily [name], MD   17 g at 06/14/23 0817   sertraline (ZOLOFT) tablet 150 mg  150 mg Oral Daily Massengill, Harrold Donath, MD   150 mg at 06/14/23 0817   SUMAtriptan (IMITREX) tablet 25 mg  25 mg Oral BID PRN [name], NP   25 mg at 05/07/23 2112    traZODone (DESYREL) tablet 50 mg  50 mg Oral QHS [name], NP   50 mg at 06/13/23 2158   Vitamin D (Ergocalciferol) (DRISDOL)  1.25 MG (50000 UNIT) capsule 50,000 Units  50,000 Units Oral Q7 days [name], NP   50,000 Units at 06/08/23 1620    Lab Results: No results found for this or any previous visit (from the past 48 hour(s)).  Blood Alcohol level:  Lab Results  Component Value Date   ETH <10 12/19/2022   ETH <10 11/21/2019    Metabolic Disorder Labs: Lab Results  Component Value Date   HGBA1C 4.4 (L) 12/21/2022   MPG 79.58 12/21/2022   No results found for: "PROLACTIN" Lab Results  Component Value Date   CHOL 218 (H) 12/21/2022   TRIG 81 12/21/2022   HDL 53 12/21/2022   CHOLHDL 4.1 12/21/2022   VLDL 16 12/21/2022   LDLCALC 149 (H) 12/21/2022   LDLCALC 110 (H) 03/13/2011    Physical Findings: AIMS: Facial and Oral Movements Muscles of Facial Expression: None Lips and Perioral Area: None Jaw: None Tongue: None,Extremity Movements Upper (arms, wrists, hands, fingers): None Lower (legs, knees, ankles, toes): None, Trunk Movements Neck, shoulders, hips: None, Global Judgements Severity of abnormal movements overall : None Incapacitation due to abnormal movements: None Patient's awareness of abnormal movements: No Awareness, Dental Status Current problems with teeth and/or dentures?: No Does patient usually wear dentures?: No  CIWA:    COWS:     Musculoskeletal: Strength & Muscle Tone: within normal limits Gait & Station: normal Patient leans: N/A  Psychiatric Specialty Exam:  Presentation  General Appearance:  Appropriate for Environment  Eye Contact: Good  Speech: Normal Rate  Speech Volume: Normal  Handedness: Right   Mood and Affect  Mood: Dysphoric  Affect: Depressed   Thought Process  Thought Processes: -- (concrete)  Descriptions of Associations:Intact  Orientation:Full (Time, Place and Person)  Thought  Content:Logical  History of Schizophrenia/Schizoaffective disorder:Yes  Duration of Psychotic Symptoms:Greater than six months  Hallucinations:Hallucinations: None Description of Auditory Hallucinations: Denies  Ideas of Reference:None  Suicidal Thoughts:Suicidal Thoughts: Yes, Passive SI Active Intent and/or Plan: -- (Denies) SI Passive Intent and/or Plan: Without Intent; Without Plan  Homicidal Thoughts:Homicidal Thoughts: No   Sensorium  Memory: Immediate Fair; Recent Fair  Judgment: Poor  Insight: Shallow   Executive Functions  Concentration: Poor  Attention Span: Fair  Recall: Fair  Fund of Knowledge: Poor  Language: Fair   Psychomotor Activity  Psychomotor Activity: Psychomotor Activity: Normal   Assets  Assets: Desire for Improvement; Leisure Time   Sleep  Sleep: Sleep: Good Number of Hours of Sleep: 9.75    Physical Exam: Physical Exam Vitals and nursing note reviewed.  HENT:     Head: Normocephalic and atraumatic.  Eyes:     Extraocular Movements: Extraocular movements intact.  Pulmonary:     Effort: Pulmonary effort is normal.  Musculoskeletal:     Cervical back: Normal range of motion.  Skin:    Comments: Self-harm scars, multiple  Neurological:     General: No focal deficit present.     Mental Status: She is alert and oriented to person, place, and time.  Psychiatric:        Mood and Affect: Mood is depressed.        Cognition and Memory: Cognition is impaired.    Review of Systems  Constitutional:  Negative for chills and fever.  Gastrointestinal:  Negative for constipation, diarrhea, nausea and vomiting.  Musculoskeletal:  Negative for myalgias.  Psychiatric/Behavioral:  Positive for suicidal ideas.    Blood pressure 119/85, pulse 84, temperature 98.3 F (36.8 C), temperature source Oral, resp.  rate 16, height 4\' 11"  (1.499 m), weight 63 kg, SpO2 96%. Body mass index is 28.05 kg/m.   Treatment Plan  Summary: Daily contact with patient to assess and evaluate symptoms and progress in treatment and Medication management   Patient is a 47 year old female with the above-stated past psychiatric history who is seen in follow-up.  Chart reviewed. Patient discussed with nursing. Patient appears at her mental baseline. No changes in medicine made today.   Diagnoses/ Active problems: -Schizoaffective disorder -Intellectual disability   PLAN:   Safety and Monitoring: continue inpatient psych admission; 15-minute checks; daily contact with patient to assess and evaluate symptoms and progress in treatment; psychoeducation.Vital signs: q12 hours. Precautions: suicide, elopement, and assault. Placed on room lock out for meals, snacks and groups.   Psychiatric Problems: -Continue Sertraline150 mg po Q daily for depression/anxiety -Continue Buspar 15 mg po bid for anxiety.  -Continue paliperidone 6 mg po qd for mood stabilization -Continue Trazodone 50 mg nightly for insomnia    - Metabolic profile and EKG monitoring obtained while on an atypical antipsychotic (BMI: Lipid Panel: HbgA1c: QTc:)    - Encouraged patient to participate in unit milieu and in scheduled group therapies    Medical Problems: -Continue Imitrex 25 mg PRN BID for migraines -Continue Colace 100 mg daily for constipation -Continue Vitamin D 50.000 units weekly for bone health. -Continue Nicoderm 21 mg topically Q 24 hrs for nicotine withdrawal management. -Continue vitamin B12 1000 mcg IM weekly for 4 weeks then monthly -Continue Melatonin 5 mg nightly for sleep -Continue Protonix EC 40 mg p.o. daily for GERD -Continue MiraLAX 17 g p.o. PRN for constipation. -Continue hydroxyzine 25 mg p.o. 3 times daily as needed for anxiety -Continue Ensure nutritional shakes TID in between meals  -Previously discontinued Hydroxyzine 50 mg nightly-hypotension in the mornings  -Imodium for diarrhea as needed.   PRN medications:    acetaminophen, alum & mag hydroxide-simeth, diphenhydrAMINE **OR** diphenhydrAMINE, haloperidol **OR** haloperidol lactate, hydrOXYzine, LORazepam **OR** LORazepam, magnesium hydroxide, nicotine polacrilex, polyethylene glycol, SUMAtriptan       Pertinent Labs: no new labs ordered today   Consults: No new consults placed since yesterday    Discharge Planning: -Social work and case management to assist with discharge planning and identification of hospital follow-up needs prior to discharge -Estimated LOS: Unknown at this time -Discharge Concerns: Need to establish a safety plan; Medication compliance and effectiveness -Discharge Goals: Return home with outpatient referrals for mental health follow-up including medication management/psychotherapy Apparently patient now has a legal guardian. Placement is in progress. The patient may not have the option to go to the mother's house unless there is 24/7 supervision.  06/13/2023: LCSW, patient, and Guardian Ad Litem had a meeting yesterday regarding patient discharge yesterday 06/12/2023.  Please see social worker's notes. [facility], MD 06/14/2023, 1:11 PM

## 2023-06-14 NOTE — Plan of Care (Signed)
  Problem: Health Behavior/Discharge Planning: Goal: Compliance with therapeutic regimen will improve Outcome: Progressing   Problem: Safety: Goal: Periods of time without injury will increase Outcome: Progressing

## 2023-06-14 NOTE — Group Note (Signed)
Recreation Therapy Group Note   Group Topic:Problem Solving  Group Date: 06/14/2023 Start Time: 0930 End Time: 0950 Facilitators: Rhet Rorke-McCall, LRT,CTRS Location: [address] Dayroom   Group Topic: Communication, Team Building, Problem Solving  Goal Area(s) Addresses:  Patient will effectively work with peer towards shared goal.  Patient will identify skills used to make activity successful.  Patient will identify how skills used during activity can be applied to reach post d/c goals.   Intervention: STEM Activity- Glass blower/designer  Group Description: Tallest Pharmacist, community. In teams of 5-6, patients were given 11 craft pipe cleaners. Using the materials provided, patients were instructed to compete again the opposing team(s) to build the tallest free-standing structure from floor level. The activity was timed; difficulty increased by Clinical research associate as Production designer, theatre/television/film continued.  Systematically resources were removed with additional directions for example, placing one arm behind their back, working in silence, and shape stipulations. LRT facilitated post-activity discussion reviewing team processes and necessary communication skills involved in completion. Patients were encouraged to reflect how the skills utilized, or not utilized, in this activity can be incorporated to positively impact support systems post discharge.  Education: Pharmacist, community, Scientist, physiological, Discharge Planning   Education Outcome: Acknowledges education/In group clarification offered/Needs additional education.    Affect/Mood: N/A   Participation Level: Did not attend    Clinical Observations/Individualized Feedback:    Plan: Continue to engage patient in RT group sessions 2-3x/week.   Yolanda [name], LRT,CTRS 06/14/2023 10:55 AM

## 2023-06-15 DIAGNOSIS — F251 Schizoaffective disorder, depressive type: Secondary | ICD-10-CM | POA: Diagnosis not present

## 2023-06-15 NOTE — Plan of Care (Signed)
  Problem: Activity: Goal: Interest or engagement in leisure activities will improve Outcome: Progressing Goal: Imbalance in normal sleep/wake cycle will improve Outcome: Progressing   

## 2023-06-15 NOTE — Progress Notes (Signed)
   06/14/23 2100  Psych Admission Type (Psych Patients Only)  Admission Status Involuntary  Psychosocial Assessment  Patient Complaints None  Eye Contact Fair  Facial Expression Animated  Affect Anxious  Speech Slow  Interaction Childlike  Motor Activity Slow  Appearance/Hygiene Disheveled  Behavior Characteristics Cooperative;Appropriate to situation  Mood Anxious  Thought Process  Coherency WDL  Content WDL  Delusions None reported or observed  Perception WDL  Hallucination None reported or observed  Judgment Impaired  Confusion None  Danger to Self  Current suicidal ideation? Denies  Self-Injurious Behavior No self-injurious ideation or behavior indicators observed or expressed   Agreement Not to Harm Self Yes  Description of Agreement Verbal  Danger to Others  Danger to Others None reported or observed

## 2023-06-15 NOTE — Group Note (Signed)
Date:  06/15/2023 Time:  9:15 PM  Group Topic/Focus:  Wrap-Up Group:   The focus of this group is to help patients review their daily goal of treatment and discuss progress on daily workbooks.    Participation Level:  Active  Participation Quality:  Appropriate and Attentive  Affect:  Appropriate  Cognitive:  Alert and Appropriate  Insight: Appropriate and Good  Engagement in Group:  Engaged  Modes of Intervention:  Discussion and Education  Additional Comments:  Pt attended and participated in wrap up group this evening and rated their day a 3/10. Pt stated that they "had high's and low's" today, but stated that their peers helped with the "low's". Pt goal tomorrow is to go to breakfast and to come out of their room more.   Chrisandra Netters 06/15/2023, 9:15 PM

## 2023-06-15 NOTE — Progress Notes (Addendum)
[facility] - Inpatient MD Progress Note  06/15/2023 7:52 AM Yolanda [name]  MRN:  [id number]  Principal Problem: Schizoaffective disorder, depressive type (HCC) Diagnosis: Principal Problem:   Schizoaffective disorder, depressive type (HCC) Active Problems:   GERD (gastroesophageal reflux disease)   Intellectual disability   Tobacco use disorder   Reason for Admission:  Yolanda [name] is a 47 y.o. female  with a past psychiatric history of schizoaffective disorder, bipolar type, multiple prior suicide attempts, cognitive delay on disability, and a history of medication nonadherence.  Admitted to [facility] from [facility] with complaints of worsening suicidal ideations with plan to stab herself. (admitted on 12/20/2022, total  LOS: 177 days )   Pertinent information discussed during bed progression: No acute events overnight.  PRNs required overnight:  Tylenol 1X   Information Obtained Today During Patient Interview:  Patient evaluated at bedside. Reports sleep is good. Reports appetite is good. States mood is "happy" unable to specify today. Reports goals for today include take care of herself, take a shower. Room appears cluttered with cups, hygiene appears poor.    On interview, suicidal ideations are not present . Homicidal ideations are not present.  There are no auditory hallucinations, visual hallucinations, paranoid ideations, or delusional thought processes.   Side effects to currently prescribed medications are none. There are no somatic complaints. Reports regular bowel movements.     Past Psychiatric History:  Previous Psychotropic Medications: Mirtazapine, Invega sustenna, Gabapentin, Cogentin, Buspar, Naltrexone, Abilify.  Family Psychiatric  History: Patient reports that everyone in her family has bipolar disorder.   Past Medical History:  Past Medical History:  Diagnosis Date   Bipolar affect, depressed (HCC)    Constipation 08/17/2022   Depression    Falls 07/21/2022    Fracture of femoral neck, right, closed (HCC) 01/17/2022   Herpes simplex 08/22/2017   Open fracture dislocation of right elbow joint 01/17/2022   Family History: History reviewed. No pertinent family history.  Current Medications: Current Facility-Administered Medications  Medication Dose Route Frequency Provider Last Rate Last Admin   acetaminophen (TYLENOL) tablet 650 mg  650 mg Oral Q6H PRN [name], NP   650 mg at 06/14/23 1613   alum & mag hydroxide-simeth (MAALOX/MYLANTA) 200-200-20 MG/5ML suspension 30 mL  30 mL Oral Q4H PRN Onuoha, Chinwendu V, NP   30 mL at 05/31/23 0801   busPIRone (BUSPAR) tablet 15 mg  15 mg Oral BID [name], NP   15 mg at 06/14/23 1656   cyanocobalamin (VITAMIN B12) injection 1,000 mcg  1,000 mcg Intramuscular Q30 days Sarita Bottom, MD   1,000 mcg at 06/02/23 1129   diphenhydrAMINE (BENADRYL) capsule 50 mg  50 mg Oral TID PRN [name], NP   50 mg at 06/03/23 1110   Or   diphenhydrAMINE (BENADRYL) injection 50 mg  50 mg Intramuscular TID PRN [name], NP       docusate sodium (COLACE) capsule 100 mg  100 mg Oral Daily Nkwenti, Doris, NP   100 mg at 06/14/23 0817   feeding supplement (ENSURE ENLIVE / ENSURE PLUS) liquid 237 mL  237 mL Oral TID BM Nkwenti, Doris, NP   237 mL at 06/14/23 2130   haloperidol (HALDOL) tablet 5 mg  5 mg Oral TID PRN [name], NP   5 mg at 06/03/23 1110   Or   haloperidol lactate (HALDOL) injection 5 mg  5 mg Intramuscular TID PRN [name], NP       hydrocortisone cream  1 %   Topical BID Cecilie Lowers, FNP   Given at 06/14/23 0818   hydrOXYzine (ATARAX) tablet 25 mg  25 mg Oral TID PRN [name], DO   25 mg at 05/27/23 1146   LORazepam (ATIVAN) tablet 2 mg  2 mg Oral TID PRN [name], NP   2 mg at 06/03/23 1110   Or   LORazepam (ATIVAN) injection 2 mg  2 mg Intramuscular TID PRN [name], NP       magnesium hydroxide (MILK OF MAGNESIA) suspension 30 mL  30 mL Oral Daily PRN [name], NP   30 mL at 06/10/23 2121   melatonin tablet 5 mg  5 mg Oral QHS Nkwenti, Doris, NP   5 mg at 06/14/23 2124   nicotine polacrilex (NICORETTE) gum 2 mg  2 mg Oral PRN [name], MD   2 mg at 06/14/23 1147   paliperidone (INVEGA) 24 hr tablet 6 mg  6 mg Oral Daily [name], NP   6 mg at 06/14/23 2124   pantoprazole (PROTONIX) EC tablet 40 mg  40 mg Oral Daily Nwoko, Nicole Kindred I, NP   40 mg at 06/14/23 0817   polyethylene glycol (MIRALAX / GLYCOLAX) packet 17 g  17 g Oral Daily PRN [name], MD   17 g at 06/07/23 1847   polyethylene glycol (MIRALAX / GLYCOLAX) packet 17 g  17 g Oral Daily [name], MD   17 g at 06/14/23 0817   sertraline (ZOLOFT) tablet 150 mg  150 mg Oral Daily Massengill, Harrold Donath, MD   150 mg at 06/14/23 0817   SUMAtriptan (IMITREX) tablet 25 mg  25 mg Oral BID PRN [name], NP   25 mg at 05/07/23 2112   traZODone (DESYREL) tablet 50 mg  50 mg Oral QHS [name], NP   50 mg at 06/14/23 2124   Vitamin D (Ergocalciferol) (DRISDOL) 1.25 MG (50000 UNIT) capsule 50,000 Units  50,000 Units Oral Q7 days [name], NP   50,000 Units at 06/08/23 1620    Lab Results: No results found for this or any previous visit (from the past 48 hour(s)).  Blood Alcohol level:  Lab Results  Component Value Date   ETH <10 12/19/2022   ETH <10 11/21/2019    Metabolic Labs: Lab Results  Component Value Date   HGBA1C 4.4 (L) 12/21/2022   MPG 79.58 12/21/2022   No results found for: "PROLACTIN" Lab Results  Component Value Date   CHOL 218 (H) 12/21/2022   TRIG 81 12/21/2022   HDL 53 12/21/2022   CHOLHDL 4.1 12/21/2022   VLDL 16 12/21/2022   LDLCALC 149 (H) 12/21/2022   LDLCALC 110 (H) 03/13/2011    Physical Findings: AIMS: No   Psychiatric Specialty Exam:  Presentation  General Appearance: Appropriate for Environment  Eye Contact:Good  Speech:Normal Rate  Speech Volume:Normal  Handedness:not assessed   Mood and Affect   Mood:Dysphoric  Affect:Depressed   Thought Process  Thought Processes:concrete  Descriptions of Associations:Intact  Orientation:Grossly intact  Thought Content:Logical  History of Schizophrenia/Schizoaffective disorder:Yes  Duration of Psychotic Symptoms:Greater than six months  Hallucinations:Hallucinations: None  Ideas of Reference:None  Suicidal Thoughts:Denies  Homicidal Thoughts:Homicidal Thoughts: No   Sensorium  Memory:Immediate Fair; Recent Fair  Judgment:Poor  Insight:Shallow   Executive Functions  Concentration:Poor  Attention Span:Fair  Recall:Fair  Fund of Knowledge:Poor  Language:Fair   Psychomotor Activity  Psychomotor Activity:Psychomotor Activity: Normal   Assets  Assets:Desire for Improvement; Leisure Time  Sleep  Sleep:Good    Physical Exam: Physical Exam Vitals and nursing note reviewed.  Constitutional:      General: She is not in acute distress.    Appearance: She is not ill-appearing.  HENT:     Head: Normocephalic and atraumatic.  Eyes:     Conjunctiva/sclera: Conjunctivae normal.  Pulmonary:     Effort: Pulmonary effort is normal. No respiratory distress.  Skin:    General: Skin is warm and dry.  Neurological:     General: No focal deficit present.    Review of Systems  All other systems reviewed and are negative.  Blood pressure 110/72, pulse 83, temperature 98.1 F (36.7 C), temperature source Oral, resp. rate 16, height 4\' 11"  (1.499 m), weight 63 kg, SpO2 100%. Body mass index is 28.05 kg/m.  Treatment Plan Summary: Daily contact with patient to assess and evaluate symptoms and progress in treatment and Medication management   ASSESSMENT: Yolanda [name] is a 47 y.o. female  with a past psychiatric history of schizoaffective disorder, bipolar type, multiple prior suicide attempts, cognitive delay on disability, and a history of medication nonadherence.  Admitted to [facility] from [facility]  with complaints of worsening suicidal ideations with plan to stab herself.   Diagnoses / Active Problems: Schizoaffective disorder bipolar type  PLAN: Safety and Monitoring:  --  INVOLUNTARY admission to inpatient psychiatric unit for safety, stabilization and treatment  -- Daily contact with patient to assess and evaluate symptoms and progress in treatment  -- Patient's case to be discussed in multi-disciplinary team meeting  -- Observation Level : q15 minute checks  -- Vital signs:  q12 hours  -- Precautions: suicide, elopement, and assault  2. Psychiatric Diagnoses and Treatment:  Trazodone 50 mg nightly for insomnia Melatonin 5 mg nightly for insomnia Zoloft 150 mg daily for depression Paliperidone 6 mg daily for psychosis BuSpar 15 mg twice daily for anxiety -- The risks/benefits/side-effects/alternatives to this medication were discussed in detail with the patient and time was given for questions. The patient consents to medication trial.              -- Metabolic profile and EKG monitoring obtained while on an atypical antipsychotic  BMI: - TSH: Pending Lipid Panel: Pending HbgA1c: Pending QTc: 432 on 04/13/2019             -- Encouraged patient to participate in unit milieu and in scheduled group therapies   -- Short Term Goals: Ability to identify changes in lifestyle to reduce recurrence of condition will improve and Ability to verbalize feelings will improve  -- Long Term Goals: Improvement in symptoms so as ready for discharge Other PRNS:  acetaminophen, alum & mag hydroxide-simeth, diphenhydrAMINE **OR** diphenhydrAMINE, haloperidol **OR** haloperidol lactate, hydrOXYzine, LORazepam **OR** LORazepam, magnesium hydroxide, nicotine polacrilex, polyethylene glycol, SUMAtriptan    3. Medical Issues Being Addressed:  Other medications include: scheduled Colace, hydrocortisone cream, Protonix 40 mg for GERD  # Vitamin D deficiency Vitamin D capsule 50,000 units q. 7  days  4. Discharge Planning:   -- Social work and case management to assist with discharge planning and identification of hospital follow-up needs prior to discharge  -- Estimated Discharge Date:    Apparently patient now has a legal guardian. Placement is in progress. The patient may not have the option to go to the mother's house unless there is 24/7 supervision.  06/13/2023: LCSW, patient, and Guardian Ad Litem had a meeting yesterday regarding patient discharge yesterday 06/12/2023.  Please see social worker's notes. May have placement for next week at a [facility]  -- Discharge Concerns: Need to establish a safety plan; Medication compliance and effectiveness  -- Discharge Goals: Return home with outpatient referrals for mental health follow-up including medication management/psychotherapy   I certify that inpatient services furnished can reasonably be expected to improve the patient's condition.   This note was created using a voice recognition software as a result there may be grammatical errors inadvertently enclosed that do not reflect the nature of this encounter. Every attempt is made to correct such errors.   Dr. Liston Alba, MD PGY-2, Psychiatry Residency  12/7/20247:52 AM

## 2023-06-15 NOTE — Progress Notes (Signed)
   06/15/23 0900  Psych Admission Type (Psych Patients Only)  Admission Status Involuntary  Psychosocial Assessment  Patient Complaints Anxiety;Depression  Eye Contact Fair  Facial Expression Animated  Affect Anxious;Depressed  Speech Slow  Interaction Minimal  Motor Activity Slow  Appearance/Hygiene Disheveled  Behavior Characteristics Cooperative  Mood Anxious;Depressed  Thought Process  Coherency WDL  Content WDL  Delusions None reported or observed  Perception WDL  Hallucination None reported or observed  Judgment Impaired  Confusion None  Danger to Self  Current suicidal ideation? Denies  Description of Suicide Plan No plan  Self-Injurious Behavior No self-injurious ideation or behavior indicators observed or expressed   Agreement Not to Harm Self Yes  Description of Agreement Verbal  Danger to Others  Danger to Others None reported or observed

## 2023-06-16 DIAGNOSIS — F251 Schizoaffective disorder, depressive type: Secondary | ICD-10-CM | POA: Diagnosis not present

## 2023-06-16 LAB — LIPID PANEL
Cholesterol: 256 mg/dL — ABNORMAL HIGH (ref 0–200)
HDL: 83 mg/dL (ref >40)
LDL Cholesterol: 128 mg/dL — ABNORMAL HIGH (ref 0–99)
Total CHOL/HDL Ratio: 3.1 {ratio}
Triglycerides: 224 mg/dL — ABNORMAL HIGH (ref <150)
VLDL: 45 mg/dL — ABNORMAL HIGH (ref 0–40)

## 2023-06-16 LAB — HEMOGLOBIN A1C
Hgb A1c MFr Bld: 5.6 % (ref 4.8–5.6)
Mean Plasma Glucose: 114.02 mg/dL

## 2023-06-16 LAB — TSH: TSH: 4.673 u[IU]/mL — ABNORMAL HIGH (ref 0.350–4.500)

## 2023-06-16 NOTE — Plan of Care (Signed)
  Problem: Coping: Goal: Coping ability will improve Outcome: Progressing   Problem: Medication: Goal: Compliance with prescribed medication regimen will improve Outcome: Progressing   

## 2023-06-16 NOTE — Progress Notes (Signed)
   06/16/23 0021  Psych Admission Type (Psych Patients Only)  Admission Status Involuntary  Psychosocial Assessment  Patient Complaints Anxiety  Eye Contact Fair  Facial Expression Animated  Affect Appropriate to circumstance  Speech Logical/coherent  Interaction Minimal  Motor Activity Other (Comment) (WDL)  Appearance/Hygiene Disheveled  Behavior Characteristics Appropriate to situation  Mood Anxious;Pleasant  Thought Process  Coherency WDL  Content WDL  Delusions None reported or observed  Perception WDL  Hallucination None reported or observed  Judgment Impaired  Confusion None  Danger to Self  Current suicidal ideation? Denies  Self-Injurious Behavior No self-injurious ideation or behavior indicators observed or expressed   Agreement Not to Harm Self Yes  Description of Agreement verbal  Danger to Others  Danger to Others None reported or observed

## 2023-06-16 NOTE — [facility] Group Notes (Signed)
[facility] Group Notes:  (Nursing/MHT/Case Management/Adjunct)  Date:  06/16/2023  Time:  8:47 PM  Type of Therapy:   Wrap-up group  Participation Level:  Active  Participation Quality:  Appropriate  Affect:  Appropriate  Cognitive:  Appropriate  Insight:  Appropriate  Engagement in Group:  Engaged  Modes of Intervention:  Education  Summary of Progress/Problems: Pt goal Stay out of her room. Rated day 9/10.  Noah Delaine 06/16/2023, 8:47 PM

## 2023-06-16 NOTE — Progress Notes (Signed)
   06/16/23 0538  15 Minute Checks  Location Bedroom  Visual Appearance Calm  Behavior Sleeping  Sleep (Behavioral Health Patients Only)  Calculate sleep? (Click Yes once per 24 hr at 0600 safety check) Yes  Documented sleep last 24 hours 9.5

## 2023-06-16 NOTE — Progress Notes (Signed)
   06/16/23 2202  Psych Admission Type (Psych Patients Only)  Admission Status Involuntary  Psychosocial Assessment  Patient Complaints Anxiety  Eye Contact Fair  Facial Expression Animated  Affect Appropriate to circumstance  Speech Logical/coherent;Soft  Interaction Childlike  Motor Activity Slow  Appearance/Hygiene Disheveled  Behavior Characteristics Appropriate to situation  Mood Depressed;Pleasant  Thought Process  Coherency WDL  Content WDL  Delusions None reported or observed  Perception WDL  Hallucination None reported or observed  Judgment Impaired  Confusion None  Danger to Self  Current suicidal ideation? Denies  Self-Injurious Behavior No self-injurious ideation or behavior indicators observed or expressed   Agreement Not to Harm Self Yes  Description of Agreement verbal  Danger to Others  Danger to Others None reported or observed

## 2023-06-16 NOTE — Progress Notes (Signed)
[facility] MD Progress Note  06/16/2023 12:27 PM Yolanda [name]  MRN:  [id number] Subjective:  Yolanda [name] was seen around lunch time. She was still in bed but often sleeps in and usually eats while on the unit. She feels that she is doing "alright" overall. She is sleeping and eating in her regular pattern. No hallucinations, thoughts of harm to self or others today. No new physical complaints.   Principal Problem: Schizoaffective disorder, depressive type (HCC) Diagnosis: Principal Problem:   Schizoaffective disorder, depressive type (HCC) Active Problems:   GERD (gastroesophageal reflux disease)   Intellectual disability   Tobacco use disorder  Total Time spent with patient: 15 minutes  Past Psychiatric History: Previous Psychotropic Medications: Mirtazapine, Invega sustenna, Gabapentin, Cogentin, Buspar, Naltrexone, Abilify.   Past Medical History:  Past Medical History:  Diagnosis Date   Bipolar affect, depressed (HCC)    Constipation 08/17/2022   Depression    Falls 07/21/2022   Fracture of femoral neck, right, closed (HCC) 01/17/2022   Herpes simplex 08/22/2017   Open fracture dislocation of right elbow joint 01/17/2022    Past Surgical History:  Procedure Laterality Date   NO PAST SURGERIES     SALPINGECTOMY     Family History: History reviewed. No pertinent family history. Family Psychiatric  History:  Patient reports that everyone in her family has bipolar disorder.  Social History:  Social History   Substance and Sexual Activity  Alcohol Use Yes     Social History   Substance and Sexual Activity  Drug Use Yes   Types: Cocaine, Marijuana    Social History   Socioeconomic History   Marital status: Single    Spouse name: Not on file   Number of children: Not on file   Years of education: Not on file   Highest education level: Not on file  Occupational History   Not on file  Tobacco Use   Smoking status: Every Day   Smokeless tobacco: Not on file  Substance  and Sexual Activity   Alcohol use: Yes   Drug use: Yes    Types: Cocaine, Marijuana   Sexual activity: Yes  Other Topics Concern   Not on file  Social History Narrative   Not on file   Social Determinants of Health   Financial Resource Strain: Low Risk  (09/12/2022)   Received from [facility], [facility]   Overall Financial Resource Strain (CARDIA)    Difficulty of Paying Living Expenses: Not hard at all  Food Insecurity: Patient Declined (12/20/2022)   Hunger Vital Sign    Worried About Running Out of Food in the Last Year: Patient declined    Ran Out of Food in the Last Year: Patient declined  Transportation Needs: No Transportation Needs (12/20/2022)   PRAPARE - Administrator, Civil Service (Medical): No    Lack of Transportation (Non-Medical): No  Physical Activity: Not on file  Stress: No Stress Concern Present (07/17/2022)   Received from [facility], [place] [place] [facility] - Occupational Stress Questionnaire    Feeling of Stress : Not at all  Social Connections: Unknown (07/16/2022)   Received from [facility], [facility]   Social Network    Social Network: Not on file   Additional Social History:                         Sleep: Good  Appetite:  Fair  Current Medications: Current Facility-Administered  Medications  Medication Dose Route Frequency Provider Last Rate Last Admin   acetaminophen (TYLENOL) tablet 650 mg  650 mg Oral Q6H PRN [name], NP   650 mg at 06/15/23 1333   alum & mag hydroxide-simeth (MAALOX/MYLANTA) 200-200-20 MG/5ML suspension 30 mL  30 mL Oral Q4H PRN Onuoha, Chinwendu V, NP   30 mL at 05/31/23 0801   busPIRone (BUSPAR) tablet 15 mg  15 mg Oral BID [name], NP   15 mg at 06/16/23 0916   cyanocobalamin (VITAMIN B12) injection 1,000 mcg  1,000 mcg Intramuscular Q30 days Sarita Bottom, MD   1,000 mcg at 06/02/23 1129   diphenhydrAMINE (BENADRYL) capsule 50 mg  50 mg Oral  TID PRN [name], NP   50 mg at 06/03/23 1110   Or   diphenhydrAMINE (BENADRYL) injection 50 mg  50 mg Intramuscular TID PRN [name], NP       docusate sodium (COLACE) capsule 100 mg  100 mg Oral Daily Nkwenti, Doris, NP   100 mg at 06/16/23 0916   feeding supplement (ENSURE ENLIVE / ENSURE PLUS) liquid 237 mL  237 mL Oral TID BM Nkwenti, Doris, NP   237 mL at 06/16/23 0917   haloperidol (HALDOL) tablet 5 mg  5 mg Oral TID PRN [name], NP   5 mg at 06/03/23 1110   Or   haloperidol lactate (HALDOL) injection 5 mg  5 mg Intramuscular TID PRN [name], NP       hydrocortisone cream 1 %   Topical BID Cecilie Lowers, FNP   Given at 06/16/23 1610   hydrOXYzine (ATARAX) tablet 25 mg  25 mg Oral TID PRN [name], DO   25 mg at 05/27/23 1146   LORazepam (ATIVAN) tablet 2 mg  2 mg Oral TID PRN [name], NP   2 mg at 06/03/23 1110   Or   LORazepam (ATIVAN) injection 2 mg  2 mg Intramuscular TID PRN [name], NP       magnesium hydroxide (MILK OF MAGNESIA) suspension 30 mL  30 mL Oral Daily PRN [name], NP   30 mL at 06/10/23 2121   melatonin tablet 5 mg  5 mg Oral QHS Nkwenti, Doris, NP   5 mg at 06/15/23 2109   nicotine polacrilex (NICORETTE) gum 2 mg  2 mg Oral PRN [name], MD   2 mg at 06/14/23 1147   paliperidone (INVEGA) 24 hr tablet 6 mg  6 mg Oral Daily [name], NP   6 mg at 06/15/23 2109   pantoprazole (PROTONIX) EC tablet 40 mg  40 mg Oral Daily Nwoko, Nicole Kindred I, NP   40 mg at 06/16/23 0916   polyethylene glycol (MIRALAX / GLYCOLAX) packet 17 g  17 g Oral Daily PRN [name], MD   17 g at 06/07/23 1847   polyethylene glycol (MIRALAX / GLYCOLAX) packet 17 g  17 g Oral Daily [name], MD   17 g at 06/16/23 0917   sertraline (ZOLOFT) tablet 150 mg  150 mg Oral Daily Massengill, Harrold Donath, MD   150 mg at 06/16/23 0916   SUMAtriptan (IMITREX) tablet 25 mg  25 mg Oral BID PRN [name], NP   25 mg at 05/07/23 2112   traZODone  (DESYREL) tablet 50 mg  50 mg Oral QHS [name], NP   50 mg at 06/15/23 2109   Vitamin D (Ergocalciferol) (DRISDOL) 1.25 MG (50000 UNIT) capsule 50,000 Units  50,000 Units Oral  Q7 days [name], NP   50,000 Units at 06/15/23 1333    Lab Results: No results found for this or any previous visit (from the past 48 hour(s)).  Blood Alcohol level:  Lab Results  Component Value Date   ETH <10 12/19/2022   ETH <10 11/21/2019    Metabolic Disorder Labs: Lab Results  Component Value Date   HGBA1C 4.4 (L) 12/21/2022   MPG 79.58 12/21/2022   No results found for: "PROLACTIN" Lab Results  Component Value Date   CHOL 218 (H) 12/21/2022   TRIG 81 12/21/2022   HDL 53 12/21/2022   CHOLHDL 4.1 12/21/2022   VLDL 16 12/21/2022   LDLCALC 149 (H) 12/21/2022   LDLCALC 110 (H) 03/13/2011    Physical Findings: AIMS: Facial and Oral Movements Muscles of Facial Expression: None Lips and Perioral Area: None Jaw: None Tongue: None,Extremity Movements Upper (arms, wrists, hands, fingers): None Lower (legs, knees, ankles, toes): None, Trunk Movements Neck, shoulders, hips: None, Global Judgements Severity of abnormal movements overall : None Incapacitation due to abnormal movements: None Patient's awareness of abnormal movements: No Awareness, Dental Status Current problems with teeth and/or dentures?: No Does patient usually wear dentures?: No  CIWA:    COWS:     Musculoskeletal: Strength & Muscle Tone: within normal limits Gait & Station: normal Patient leans: N/A  Psychiatric Specialty Exam:  Presentation  General Appearance:  Appropriate for Environment  Eye Contact: Good  Speech: Normal Rate  Speech Volume: Normal  Handedness: Right   Mood and Affect  Mood: Dysphoric  Affect: Depressed   Thought Process  Thought Processes: -- (concrete)  Descriptions of Associations:Intact  Orientation:Full (Time, Place and Person)  Thought  Content:Logical  History of Schizophrenia/Schizoaffective disorder:Yes  Duration of Psychotic Symptoms:Greater than six months  Hallucinations:No data recorded Ideas of Reference:None  Suicidal Thoughts:No data recorded Homicidal Thoughts:No data recorded  Sensorium  Memory: Immediate Fair; Recent Fair  Judgment: Poor  Insight: Shallow   Executive Functions  Concentration: Poor  Attention Span: Fair  Recall: Fair  Fund of Knowledge: Poor  Language: Fair   Psychomotor Activity  Psychomotor Activity:No data recorded  Assets  Assets: Desire for Improvement; Leisure Time   Sleep  Sleep:No data recorded   Physical Exam: Physical Exam Vitals and nursing note reviewed.  Constitutional:      Appearance: Normal appearance.  HENT:     Head: Normocephalic and atraumatic.  Eyes:     Extraocular Movements: Extraocular movements intact.  Pulmonary:     Effort: Pulmonary effort is normal.  Musculoskeletal:        General: Normal range of motion.     Cervical back: Normal range of motion.  Neurological:     General: No focal deficit present.     Mental Status: She is alert.  Psychiatric:        Behavior: Behavior normal.    Review of Systems  Constitutional:  Negative for chills and fever.  Gastrointestinal:  Negative for constipation, diarrhea, nausea and vomiting.  Musculoskeletal:  Negative for myalgias.  Neurological:  Negative for headaches.  Psychiatric/Behavioral:  Negative for hallucinations and suicidal ideas.    Blood pressure 121/65, pulse 94, temperature 98.3 F (36.8 C), temperature source Oral, resp. rate 12, height 4\' 11"  (1.499 m), weight 63 kg, SpO2 99%. Body mass index is 28.05 kg/m.   Treatment Plan Summary: Daily contact with patient to assess and evaluate symptoms and progress in treatment and Medication management     ASSESSMENT: Manette D  Tout is a 47 y.o. female  with a past psychiatric history of schizoaffective  disorder, bipolar type, multiple prior suicide attempts, cognitive delay on disability, and a history of medication nonadherence.  Admitted to [facility] from [facility] with complaints of worsening suicidal ideations with plan to stab herself.     Diagnoses / Active Problems: Schizoaffective disorder bipolar type   PLAN: Safety and Monitoring:             --  INVOLUNTARY admission to inpatient psychiatric unit for safety, stabilization and treatment             -- Daily contact with patient to assess and evaluate symptoms and progress in treatment             -- Patient's case to be discussed in multi-disciplinary team meeting             -- Observation Level : q15 minute checks             -- Vital signs:  q12 hours             -- Precautions: suicide, elopement, and assault   2. Psychiatric Diagnoses and Treatment:  Trazodone 50 mg nightly for insomnia Melatonin 5 mg nightly for insomnia Zoloft 150 mg daily for depression Paliperidone 6 mg daily for psychosis BuSpar 15 mg twice daily for anxiety -- The risks/benefits/side-effects/alternatives to this medication were discussed in detail with the patient and time was given for questions. The patient consents to medication trial.              -- Metabolic profile and EKG monitoring obtained while on an atypical antipsychotic  BMI: - TSH: Pending Lipid Panel: Pending HbgA1c: Pending QTc: 432 on 04/13/2019             -- Encouraged patient to participate in unit milieu and in scheduled group therapies              -- Short Term Goals: Ability to identify changes in lifestyle to reduce recurrence of condition will improve and Ability to verbalize feelings will improve             -- Long Term Goals: Improvement in symptoms so as ready for discharge Other PRNS:  acetaminophen, alum & mag hydroxide-simeth, diphenhydrAMINE **OR** diphenhydrAMINE, haloperidol **OR** haloperidol lactate, hydrOXYzine, LORazepam **OR** LORazepam, magnesium  hydroxide, nicotine polacrilex, polyethylene glycol, SUMAtriptan               3. Medical Issues Being Addressed:  Other medications include: scheduled Colace, hydrocortisone cream, Protonix 40 mg for GERD   # Vitamin D deficiency Vitamin D capsule 50,000 units q. 7 days   4. Discharge Planning:              -- Social work and case management to assist with discharge planning and identification of hospital follow-up needs prior to discharge             -- Estimated Discharge Date:                          Apparently patient now has a legal guardian. Placement is in progress. The patient may not have the option to go to the mother's house unless there is 24/7 supervision.  06/13/2023: LCSW, patient, and Guardian Ad Litem had a meeting yesterday regarding patient discharge yesterday 06/12/2023.  Please see social worker's notes. May have [facility] for next week at a [facility]             --  Discharge Concerns: Need to establish a safety plan; Medication compliance and effectiveness             -- Discharge Goals: Return home with outpatient referrals for mental health follow-up including medication management/psychotherapy    I certify that inpatient services furnished can reasonably be expected to improve the patient's condition.    Roselle Locus, MD 06/16/2023, 12:27 PM

## 2023-06-16 NOTE — Progress Notes (Signed)
   06/16/23 1057  Psych Admission Type (Psych Patients Only)  Admission Status Involuntary  Psychosocial Assessment  Patient Complaints Anxiety  Eye Contact Fair  Facial Expression Animated  Affect Appropriate to circumstance  Speech Logical/coherent  Interaction Childlike  Motor Activity Slow  Appearance/Hygiene Disheveled  Behavior Characteristics Appropriate to situation  Mood Anxious;Depressed  Thought Process  Coherency WDL  Content WDL  Delusions None reported or observed  Perception WDL  Hallucination None reported or observed  Judgment Impaired  Confusion None  Danger to Self  Current suicidal ideation? Denies  Self-Injurious Behavior No self-injurious ideation or behavior indicators observed or expressed   Agreement Not to Harm Self Yes  Description of Agreement verbal  Danger to Others  Danger to Others None reported or observed

## 2023-06-16 NOTE — [facility] Group Notes (Signed)
Type of Therapy and Topic:  Group Therapy: Mindfulness  Participation Level:  Did Not Attend   Description of Group:   In this group, patients shared and discussed the importance of acknowledging the elements in their lives for which they are Mindfulness and how this can positively impact their mood.  The group discussed how bringing the positive elements of their lives to the forefront of their minds can help with recovery from any illness, physical or mental.  An exercise was done as a group in which a list was made of mindfulness items to encourage participants to consider other potential positives in their lives.  Therapeutic Goals: Patients will identify one or more item for which they are grateful in each of 6 categories:  people, experiences, things, places, skills, and other. Patients will discuss how it is possible to seek out gratitude in even bad situations. Patients will explore other possible items of gratitude that they could remember.   Summary of Patient Progress:  NA  Therapeutic Modalities:   Solution-Focused Therapy Activity

## 2023-06-16 NOTE — [facility] Group Notes (Signed)
[facility] Group Notes:  (Nursing)  Date:  06/16/2023  Time:  1400  Type of Therapy:  Psychoeducational Skills  Participation Level:  Active  Participation Quality:  Appropriate and Attentive  Affect:  Appropriate  Cognitive:  Alert and Appropriate  Insight:  Improving  Engagement in Group:  Engaged  Modes of Intervention:  Activity, Discussion, Exploration, Rapport Building, Socialization, and Support  Summary of Progress/Problems:  Patients created their own 'Life Collage"  Shela Nevin 06/16/2023, 4:36 PM

## 2023-06-16 NOTE — Plan of Care (Signed)
  Problem: Education: Goal: Ability to make informed decisions regarding treatment will improve Outcome: Progressing   Problem: Coping: Goal: Coping ability will improve Outcome: Progressing   Problem: Health Behavior/Discharge Planning: Goal: Identification of resources available to assist in meeting health care needs will improve Outcome: Progressing   Problem: Medication: Goal: Compliance with prescribed medication regimen will improve Outcome: Progressing   Problem: Self-Concept: Goal: Ability to disclose and discuss suicidal ideas will improve Outcome: Progressing   Problem: Education: Goal: Knowledge of [facility] Education information/materials will improve Outcome: Progressing Goal: Emotional status will improve Outcome: Progressing Goal: Mental status will improve Outcome: Progressing Goal: Verbalization of understanding the information provided will improve Outcome: Progressing   Problem: Activity: Goal: Interest or engagement in activities will improve Outcome: Progressing Goal: Sleeping patterns will improve Outcome: Progressing   Problem: Coping: Goal: Ability to verbalize frustrations and anger appropriately will improve Outcome: Progressing Goal: Ability to demonstrate self-control will improve Outcome: Progressing   Problem: Safety: Goal: Periods of time without injury will increase Outcome: Progressing

## 2023-06-17 DIAGNOSIS — F251 Schizoaffective disorder, depressive type: Secondary | ICD-10-CM | POA: Diagnosis not present

## 2023-06-17 NOTE — [facility] Group Notes (Signed)
Psychoeducational Group Note  Date:  06/17/2023 Time:  2000  Group Topic/Focus:  Alcoholics Anonymous Meeting  Participation Level: Did Not Attend  Participation Quality:  Not Applicable  Affect:  Not Applicable  Cognitive:  Not Applicable  Insight:  Not Applicable  Engagement in Group: Not Applicable  Additional Comments:  Did not attend.   [name] 06/17/2023, 9:19 PM

## 2023-06-17 NOTE — Progress Notes (Signed)
   06/17/23 0525  15 Minute Checks  Location Bedroom  Visual Appearance Calm  Behavior Sleeping  Sleep (Behavioral Health Patients Only)  Calculate sleep? (Click Yes once per 24 hr at 0600 safety check) Yes  Documented sleep last 24 hours 8.25

## 2023-06-17 NOTE — Plan of Care (Signed)
  Problem: Coping: Goal: Coping ability will improve Outcome: Progressing   Problem: Medication: Goal: Compliance with prescribed medication regimen will improve Outcome: Progressing   

## 2023-06-17 NOTE — Group Note (Signed)
Recreation Therapy Group Note   Group Topic:Communication  Group Date: 06/17/2023 Start Time: 0940 End Time: 1000 Facilitators: Adarrius Graeff-McCall, LRT,CTRS Location: [address]   Group Topic:Communication  Group Date: 06/17/2023 Start Time: 0940 End Time: 1000 Facilitators: Taran Haynesworth-McCall, LRT,CTRS Location: [address]     Group Topic: Communication, Problem Solving   Goal Area(s) Addresses:  Patient will effectively listen to complete activity.  Patient will identify communication skills used to make activity successful.  Patient will identify how skills used during activity can be used to reach post d/c goals.    Intervention: Building surveyor Activity - Geometric pattern cards, pencils, blank paper    Group Description: Geometric Drawings.  Three volunteers from the peer group will be shown an abstract picture with a particular arrangement of geometrical shapes.  Each round, one 'speaker' will describe the pattern, as accurately as possible without revealing the image to the group.  The remaining group members will listen and draw the picture to reflect how it is described to them. Patients with the role of 'listener' cannot ask clarifying questions but, may request that the speaker repeat a direction. Once the drawings are complete, the presenter will show the rest of the group the picture and compare how close each person came to drawing the picture. LRT will facilitate a post-activity discussion regarding effective communication and the importance of planning, listening, and asking for clarification in daily interactions with others.   Education: Environmental consultant, Active listening, Support systems, Discharge planning   Education Outcome: Acknowledges understanding/In group clarification offered/Needs additional education.    Affect/Mood: Appropriate   Participation Level: Active   Participation Quality: Independent   Behavior:  Appropriate   Speech/Thought Process: Relevant   Insight: Moderate   Judgement: Moderate   Modes of Intervention: Activity   Patient Response to Interventions:  Engaged   Education Outcome:  In group clarification offered    Clinical Observations/Individualized Feedback: Pt presented the bonus drawing to peers. Pt had some struggles in describing the picture. Pt did receive assistance from LRT to complete description. Pt explained when talking to people, you have to let the other person know what exactly it is you want.    Plan: Continue to engage patient in RT group sessions 2-3x/week.   Emogene Muratalla-McCall, LRT,CTRS 06/17/2023 12:18 PM

## 2023-06-17 NOTE — Progress Notes (Signed)
[facility] MD Progress Note  06/17/2023 11:25 AM Lexey ADELINE RANEY  MRN:  [id number]  Reason for admission:  This is the first psychiatric admission in this [facility] in 12 years for this AA female with an extensive hx of mental illnesses & probable polysubstance use disorders. She is admitted to the [facility] from the [facility] hospital with complain of worsening suicidal ideations with plan to stab herself. Per chart review, patient apparently reported at the ED that she has been depressed for a while & has not been taking her mental health medications. After medical evaluation.clearance, she was transferred to the [facility] for further psychiatric evaluation/treatments. During this evaluation, Sharlyn presents irritated, labile, angry & tearful. She is not forth-coming with the information needed to help manage her care.  She denies delusional thinking or paranoia.   Today's assessment notes:   Patient seen and examined sitting up in a chair on the unit.  She is alert, pleasant, and oriented to person, place, time and situation.  Patient reports that she may be discharging to [facility] home [place] Friday, 06/21/2023.  Appears excited with smiles while relating this information to this provider.  Observed attending and participating in therapeutic milieu.  Reports her goal today is to continue to study for her GED.  Denies any acute discomfort or physical pain.  Denies delusional thinking or paranoia.  Patient continues to require inpatient psychiatric admission at this time until proper and safe placement upon discharge is arranged. Reports that anxiety is is at manageable level Sleep is good.  Nursing staff report patient sleeping about 8.25 hours last night and feeling restful Appetite is great Concentration is improving Energy level is adequate She denies SI, HI, or AVH Denies having side effects to current psychiatric medications.   We discussed compliance to current medication  regimen.  Principal Problem: Schizoaffective disorder, depressive type (HCC) Diagnosis: Principal Problem:   Schizoaffective disorder, depressive type (HCC) Active Problems:   GERD (gastroesophageal reflux disease)   Intellectual disability   Tobacco use disorder  Total Time spent with patient: 35 minutes  Past Psychiatric History: Previous Psychotropic Medications: Mirtazapine, Invega sustenna, Gabapentin, Cogentin, Buspar, Naltrexone, Abilify.   Past Medical History:  Past Medical History:  Diagnosis Date   Bipolar affect, depressed (HCC)    Constipation 08/17/2022   Depression    Falls 07/21/2022   Fracture of femoral neck, right, closed (HCC) 01/17/2022   Herpes simplex 08/22/2017   Open fracture dislocation of right elbow joint 01/17/2022    Past Surgical History:  Procedure Laterality Date   NO PAST SURGERIES     SALPINGECTOMY     Family History: History reviewed. No pertinent family history. Family Psychiatric  History:  Patient reports that everyone in her family has bipolar disorder.  Social History:  Social History   Substance and Sexual Activity  Alcohol Use Yes     Social History   Substance and Sexual Activity  Drug Use Yes   Types: Cocaine, Marijuana    Social History   Socioeconomic History   Marital status: Single    Spouse name: Not on file   Number of children: Not on file   Years of education: Not on file   Highest education level: Not on file  Occupational History   Not on file  Tobacco Use   Smoking status: Every Day   Smokeless tobacco: Not on file  Substance and Sexual Activity   Alcohol use: Yes   Drug use: Yes  Types: Cocaine, Marijuana   Sexual activity: Yes  Other Topics Concern   Not on file  Social History Narrative   Not on file   Social Determinants of Health   Financial Resource Strain: Low Risk  (09/12/2022)   Received from [facility], [facility]   Overall Financial Resource Strain (CARDIA)    Difficulty of  Paying Living Expenses: Not hard at all  Food Insecurity: Patient Declined (12/20/2022)   Hunger Vital Sign    Worried About Running Out of Food in the Last Year: Patient declined    Ran Out of Food in the Last Year: Patient declined  Transportation Needs: No Transportation Needs (12/20/2022)   PRAPARE - Administrator, Civil Service (Medical): No    Lack of Transportation (Non-Medical): No  Physical Activity: Not on file  Stress: No Stress Concern Present (07/17/2022)   Received from [facility], [facility] - Occupational Stress Questionnaire    Feeling of Stress : Not at all  Social Connections: Unknown (07/16/2022)   Received from [facility], [facility]   Social Network    Social Network: Not on file   Additional Social History:    Sleep: Good  Appetite:  Fair  Current Medications: Current Facility-Administered Medications  Medication Dose Route Frequency Provider Last Rate Last Admin   acetaminophen (TYLENOL) tablet 650 mg  650 mg Oral Q6H PRN [name], NP   650 mg at 06/16/23 1318   alum & mag hydroxide-simeth (MAALOX/MYLANTA) 200-200-20 MG/5ML suspension 30 mL  30 mL Oral Q4H PRN Onuoha, Chinwendu V, NP   30 mL at 05/31/23 0801   busPIRone (BUSPAR) tablet 15 mg  15 mg Oral BID [name], NP   15 mg at 06/17/23 0834   cyanocobalamin (VITAMIN B12) injection 1,000 mcg  1,000 mcg Intramuscular Q30 days [name], Nadir, MD   1,000 mcg at 06/02/23 1129   diphenhydrAMINE (BENADRYL) capsule 50 mg  50 mg Oral TID PRN [name], NP   50 mg at 06/03/23 1110   Or   diphenhydrAMINE (BENADRYL) injection 50 mg  50 mg Intramuscular TID PRN [name], NP       docusate sodium (COLACE) capsule 100 mg  100 mg Oral Daily Nkwenti, Doris, NP   100 mg at 06/17/23 0834   feeding supplement (ENSURE ENLIVE / ENSURE PLUS) liquid 237 mL  237 mL Oral TID BM Nkwenti, Doris, NP   237 mL at 06/17/23 1025   haloperidol (HALDOL)  tablet 5 mg  5 mg Oral TID PRN [name], NP   5 mg at 06/03/23 1110   Or   haloperidol lactate (HALDOL) injection 5 mg  5 mg Intramuscular TID PRN [name], NP       hydrocortisone cream 1 %   Topical BID Cecilie Lowers, FNP   Given at 06/17/23 0836   hydrOXYzine (ATARAX) tablet 25 mg  25 mg Oral TID PRN [name], DO   25 mg at 05/27/23 1146   LORazepam (ATIVAN) tablet 2 mg  2 mg Oral TID PRN [name], NP   2 mg at 06/03/23 1110   Or   LORazepam (ATIVAN) injection 2 mg  2 mg Intramuscular TID PRN [name], NP       magnesium hydroxide (MILK OF MAGNESIA) suspension 30 mL  30 mL Oral Daily PRN [name], NP   30 mL at 06/10/23 2121   melatonin tablet 5 mg  5 mg Oral QHS [name], NP   5 mg at 06/16/23 2058   nicotine polacrilex (NICORETTE) gum 2 mg  2 mg Oral PRN [name], MD   2 mg at 06/16/23 1813   paliperidone (INVEGA) 24 hr tablet 6 mg  6 mg Oral Daily Nkwenti, Tyler Aas, NP   6 mg at 06/16/23 2058   pantoprazole (PROTONIX) EC tablet 40 mg  40 mg Oral Daily Nwoko, Nicole Kindred I, NP   40 mg at 06/17/23 0834   polyethylene glycol (MIRALAX / GLYCOLAX) packet 17 g  17 g Oral Daily PRN [name], MD   17 g at 06/07/23 1847   polyethylene glycol (MIRALAX / GLYCOLAX) packet 17 g  17 g Oral Daily [name], MD   17 g at 06/16/23 0917   sertraline (ZOLOFT) tablet 150 mg  150 mg Oral Daily Massengill, Harrold Donath, MD   150 mg at 06/17/23 0834   SUMAtriptan (IMITREX) tablet 25 mg  25 mg Oral BID PRN [name], NP   25 mg at 05/07/23 2112   traZODone (DESYREL) tablet 50 mg  50 mg Oral QHS [name], NP   50 mg at 06/16/23 2059   Vitamin D (Ergocalciferol) (DRISDOL) 1.25 MG (50000 UNIT) capsule 50,000 Units  50,000 Units Oral Q7 days [name], NP   50,000 Units at 06/15/23 1333    Lab Results:  Results for orders placed or performed during the hospital encounter of 12/20/22 (from the past 48 hour(s))  TSH     Status: Abnormal   Collection Time:  06/16/23  7:00 PM  Result Value Ref Range   TSH 4.673 (H) 0.350 - 4.500 uIU/mL    Comment: Performed by a 3rd Generation assay with a functional sensitivity of <=0.01 uIU/mL. Performed at [facility], [address], [place], [place] [address]   Hemoglobin A1c     Status: None   Collection Time: 06/16/23  7:00 PM  Result Value Ref Range   Hgb A1c MFr Bld 5.6 4.8 - 5.6 %    Comment: (NOTE) Pre diabetes:          5.7%-6.4%  Diabetes:              >6.4%  Glycemic control for   <7.0% adults with diabetes    Mean Plasma Glucose 114.02 mg/dL    Comment: Performed at [facility], [address], [place], [place] [address]  Lipid panel     Status: Abnormal   Collection Time: 06/16/23  7:00 PM  Result Value Ref Range   Cholesterol 256 (H) 0 - 200 mg/dL   Triglycerides 010 (H) <150 mg/dL   HDL 83 >27 mg/dL   Total CHOL/HDL Ratio 3.1 RATIO   VLDL 45 (H) 0 - 40 mg/dL   LDL Cholesterol 253 (H) 0 - 99 mg/dL    Comment:        Total Cholesterol/HDL:CHD Risk Coronary Heart Disease Risk Table                     Men   Women  1/2 Average Risk   3.4   3.3  Average Risk       5.0   4.4  2 X Average Risk   9.6   7.1  3 X Average Risk  23.4   11.0        Use the calculated Patient Ratio above and the CHD Risk Table to determine the patient's CHD Risk.  ATP III CLASSIFICATION (LDL):  <100     mg/dL   Optimal  409-811  mg/dL   Near or Above                    Optimal  130-159  mg/dL   Borderline  914-782  mg/dL   High  >956     mg/dL   Very High Performed at [facility], [address], [place], [place] [address]     Blood Alcohol level:  Lab Results  Component Value Date   ETH <10 12/19/2022   ETH <10 11/21/2019    Metabolic Disorder Labs: Lab Results  Component Value Date   HGBA1C 5.6 06/16/2023   MPG 114.02 06/16/2023   MPG 79.58 12/21/2022   No results found for: "PROLACTIN" Lab Results  Component Value Date   CHOL  256 (H) 06/16/2023   TRIG 224 (H) 06/16/2023   HDL 83 06/16/2023   CHOLHDL 3.1 06/16/2023   VLDL 45 (H) 06/16/2023   LDLCALC 128 (H) 06/16/2023   LDLCALC 149 (H) 12/21/2022    Physical Findings: AIMS: Facial and Oral Movements Muscles of Facial Expression: None Lips and Perioral Area: None Jaw: None Tongue: None,Extremity Movements Upper (arms, wrists, hands, fingers): None Lower (legs, knees, ankles, toes): None, Trunk Movements Neck, shoulders, hips: None, Global Judgements Severity of abnormal movements overall : None Incapacitation due to abnormal movements: None Patient's awareness of abnormal movements: No Awareness, Dental Status Current problems with teeth and/or dentures?: No Does patient usually wear dentures?: No  CIWA:    COWS:     Musculoskeletal: Strength & Muscle Tone: within normal limits Gait & Station: normal Patient leans: N/A  Psychiatric Specialty Exam:  Presentation  General Appearance:  Appropriate for Environment; Casual  Eye Contact: Good  Speech: Clear and Coherent  Speech Volume: Normal  Handedness: Right  Mood and Affect  Mood: Euthymic  Affect: Congruent  Thought Process  Thought Processes: -- (Concrete)  Descriptions of Associations:Intact  Orientation:Full (Time, Place and Person)  Thought Content:Logical (Childlike)  History of Schizophrenia/Schizoaffective disorder:Yes  Duration of Psychotic Symptoms:Greater than six months  Hallucinations:Hallucinations: None Description of Auditory Hallucinations: Denies  Ideas of Reference:-- (Denies)  Suicidal Thoughts:Suicidal Thoughts: No SI Active Intent and/or Plan: -- (Denies) SI Passive Intent and/or Plan: -- (Denies)  Homicidal Thoughts:Homicidal Thoughts: No  Sensorium  Memory: Immediate Fair; Recent Fair  Judgment: Impaired (However improving)  Insight: Shallow  Executive Functions  Concentration: Fair  Attention  Span: Fair  Recall: Fair  Fund of Knowledge: Fair  Language: Fair  Psychomotor Activity  Psychomotor Activity:Psychomotor Activity: Normal  Assets  Assets: Manufacturing systems engineer; Resilience  Sleep  Sleep:Sleep: Good Number of Hours of Sleep: 8.25  Physical Exam: Physical Exam Vitals and nursing note reviewed.  Constitutional:      Appearance: Normal appearance.  HENT:     Head: Normocephalic and atraumatic.     Mouth/Throat:     Mouth: Mucous membranes are moist.     Pharynx: Oropharynx is clear.  Eyes:     Extraocular Movements: Extraocular movements intact.  Cardiovascular:     Rate and Rhythm: Normal rate.     Pulses: Normal pulses.  Pulmonary:     Effort: Pulmonary effort is normal.  Abdominal:     Comments: Deferred  Genitourinary:    Comments: Deferred Musculoskeletal:        General: Normal range of motion.     Cervical back: Normal range of motion.  Skin:  General: Skin is warm.  Neurological:     General: No focal deficit present.     Mental Status: She is alert.  Psychiatric:        Mood and Affect: Mood normal.        Behavior: Behavior normal.    Review of Systems  Constitutional:  Negative for chills and fever.  HENT:  Negative for sore throat.   Eyes:  Negative for blurred vision.  Respiratory:  Negative for cough, sputum production, shortness of breath and wheezing.   Cardiovascular:  Negative for chest pain and palpitations.  Gastrointestinal:  Negative for constipation, diarrhea, heartburn, nausea and vomiting.  Genitourinary:  Negative for dysuria, frequency and urgency.  Musculoskeletal:  Negative for myalgias.  Skin:  Negative for itching and rash.  Neurological:  Negative for headaches.  Endo/Heme/Allergies:        See allergy listing  Psychiatric/Behavioral:  Negative for hallucinations and suicidal ideas.    Blood pressure 121/65, pulse 94, temperature 98.3 F (36.8 C), temperature source Oral, resp. rate 12, height 4'  11" (1.499 m), weight 63 kg, SpO2 99%. Body mass index is 28.05 kg/m.  Treatment Plan Summary: Daily contact with patient to assess and evaluate symptoms and progress in treatment and Medication management   ASSESSMENT: Arelia D Sofield is a 47 y.o. female  with a past psychiatric history of schizoaffective disorder, bipolar type, multiple prior suicide attempts, cognitive delay on disability, and a history of medication nonadherence.  Admitted to [facility] from [facility] with complaints of worsening suicidal ideations with plan to stab herself.   Diagnoses / Active Problems: Schizoaffective disorder bipolar type   PLAN: Safety and Monitoring:             --  INVOLUNTARY admission to inpatient psychiatric unit for safety, stabilization and treatment             -- Daily contact with patient to assess and evaluate symptoms and progress in treatment             -- Patient's case to be discussed in multi-disciplinary team meeting             -- Observation Level : q15 minute checks             -- Vital signs:  q12 hours             -- Precautions: suicide, elopement, and assault   2. Psychiatric Diagnoses and Treatment:  Trazodone 50 mg nightly for insomnia Melatonin 5 mg nightly for insomnia Zoloft 150 mg daily for depression Paliperidone 6 mg daily for psychosis BuSpar 15 mg twice daily for anxiety -- The risks/benefits/side-effects/alternatives to this medication were discussed in detail with the patient and time was given for questions. The patient consents to medication trial.              -- Metabolic profile and EKG monitoring obtained while on an atypical antipsychotic  BMI: - TSH: Pending Lipid Panel: Pending HbgA1c: Pending QTc: 432 on 04/13/2019             -- Encouraged patient to participate in unit milieu and in scheduled group therapies              -- Short Term Goals: Ability to identify changes in lifestyle to reduce recurrence of condition will improve and  Ability to verbalize feelings will improve             -- Long Term Goals: Improvement in  symptoms so as ready for discharge Other PRNS:  acetaminophen, alum & mag hydroxide-simeth, diphenhydrAMINE **OR** diphenhydrAMINE, haloperidol **OR** haloperidol lactate, hydrOXYzine, LORazepam **OR** LORazepam, magnesium hydroxide, nicotine polacrilex, polyethylene glycol, SUMAtriptan               3. Medical Issues Being Addressed:  Other medications include: scheduled Colace, hydrocortisone cream, Protonix 40 mg for GERD   # Vitamin D deficiency Vitamin D capsule 50,000 units q. 7 days   4. Discharge Planning:              -- Social work and case management to assist with discharge planning and identification of hospital follow-up needs prior to discharge             -- Estimated Discharge Date:                          Apparently patient now has a legal guardian. Placement is in progress. The patient may not have the option to go to the mother's house unless there is 24/7 supervision.  06/13/2023: LCSW, patient, and Guardian Ad Litem had a meeting yesterday regarding patient discharge yesterday 06/12/2023.  Please see social worker's notes. May have placement for next week at a [facility]             -- Discharge Concerns: Need to establish a safety plan; Medication compliance and effectiveness             -- Discharge Goals: Return home with outpatient referrals for mental health follow-up including medication management/psychotherapy    I certify that inpatient services furnished can reasonably be expected to improve the patient's condition.    Cecilie Lowers, FNP 06/17/2023, 11:25 AM Patient ID: Larey Brick, female   DOB: [birth year], 47 y.o.   MRN: [id number]

## 2023-06-17 NOTE — Plan of Care (Signed)
  Problem: Health Behavior/Discharge Planning: Goal: Identification of resources available to assist in meeting health care needs will improve Outcome: Progressing   Problem: Medication: Goal: Compliance with prescribed medication regimen will improve Outcome: Progressing   Problem: Self-Concept: Goal: Ability to disclose and discuss suicidal ideas will improve Outcome: Progressing   

## 2023-06-17 NOTE — Progress Notes (Signed)
   06/17/23 1000  Psych Admission Type (Psych Patients Only)  Admission Status Involuntary  Psychosocial Assessment  Patient Complaints None  Eye Contact Fair  Facial Expression Animated  Affect Appropriate to circumstance  Speech Soft  Interaction Childlike  Motor Activity Slow  Appearance/Hygiene Disheveled  Behavior Characteristics Appropriate to situation  Mood Pleasant  Thought Process  Coherency WDL  Content WDL  Delusions None reported or observed  Perception WDL  Hallucination None reported or observed  Judgment Impaired  Confusion None  Danger to Self  Current suicidal ideation? Denies  Danger to Others  Danger to Others None reported or observed

## 2023-06-17 NOTE — Group Note (Signed)
Date:  06/17/2023 Time:  10:45 AM  Group Topic/Focus:  Orientation:   The focus of this group is to educate the patient on the purpose and policies of crisis stabilization and provide a format to answer questions about their admission.  The group details unit policies and expectations of patients while admitted.    Participation Level:  Active  Participation Quality:  Appropriate  Affect:  Angry  Cognitive:  Appropriate  Insight: Appropriate  Engagement in Group:  Engaged  Modes of Intervention:  Discussion  Additional Comments:     [name] 06/17/2023, 10:45 AM

## 2023-06-18 DIAGNOSIS — F251 Schizoaffective disorder, depressive type: Secondary | ICD-10-CM | POA: Diagnosis not present

## 2023-06-18 NOTE — Plan of Care (Signed)
  Problem: Activity: Goal: Interest or engagement in activities will improve Outcome: Progressing   Problem: Safety: Goal: Periods of time without injury will increase Outcome: Progressing   Problem: Medication: Goal: Compliance with prescribed medication regimen will improve Outcome: Progressing

## 2023-06-18 NOTE — Progress Notes (Signed)
   06/18/23 0900  Psych Admission Type (Psych Patients Only)  Admission Status Involuntary  Psychosocial Assessment  Patient Complaints None  Eye Contact Fair  Facial Expression Animated  Affect Appropriate to circumstance  Speech Soft  Interaction Childlike  Motor Activity Slow  Appearance/Hygiene Disheveled  Behavior Characteristics Appropriate to situation  Mood Pleasant  Thought Process  Coherency WDL  Content WDL  Delusions None reported or observed  Perception WDL  Hallucination None reported or observed  Judgment Impaired  Confusion None  Danger to Self  Current suicidal ideation? Denies  Danger to Others  Danger to Others None reported or observed

## 2023-06-18 NOTE — Progress Notes (Signed)
   06/17/23 2058  Psych Admission Type (Psych Patients Only)  Admission Status Involuntary  Psychosocial Assessment  Patient Complaints Anxiety  Eye Contact Fair  Facial Expression Animated  Affect Appropriate to circumstance  Speech Soft  Interaction Assertive;Childlike  Motor Activity Slow  Appearance/Hygiene Disheveled  Behavior Characteristics Cooperative  Mood Pleasant;Anxious  Thought Process  Coherency WDL  Content WDL  Delusions None reported or observed  Perception WDL  Hallucination None reported or observed  Judgment Impaired  Confusion None  Danger to Self  Current suicidal ideation? Denies  Agreement Not to Harm Self Yes  Description of Agreement verbal  Danger to Others  Danger to Others None reported or observed

## 2023-06-18 NOTE — Discharge Planning (Signed)
LCSW received phone call back from Group Home Representative [name] on this morning regarding updates. Per Mrs. Earlene Plater, there was a delay in paperwork submittal so her team will be processing the paperwork on today and contacting [employer] to expedite the process. Mrs. Earlene Plater requested that clinicals be sent to her QP for processing. Clinicals sent and LCSW followed up with QP Dr. Cheree Ditto [phone] as advised to inform of current need. Per Mr. Cheree Ditto, process will be started and they will follow up as needed.   LCSW reached out to DSS Representative Thamas Jaegers to provide update. LCSW was informed that the patient has now been assigned a Care Manager with Norval Gable, LCSW [phone]. Additional information has been sent via email to this provider. Ms. [name] aware that once update has been provided. LCSW will follow up to inform. No other needs were reported at this time.   LCSW spoke with Department Supervisor Verner Chol to provide update. LCSW will continue to follow and provide support to patient while in hospital.   Fernande Boyden, LCSW Clinical Social Worker [place] Ph: [phone]

## 2023-06-18 NOTE — Group Note (Signed)
Date:  06/18/2023 Time:  4:36 AM  Group Topic/Focus:  Wrap-Up Group:   The focus of this group is to help patients review their daily goal of treatment and discuss progress on daily workbooks.    Participation Level:  Did Not Attend  Participation Quality:   N/A  Affect:   N/A  Cognitive:   N/A  Insight: None  Engagement in Group:   N/A  Modes of Intervention:   N/A  Additional Comments:  Patient did not attend wrap up group.   Kennieth Francois 06/18/2023, 4:36 AM

## 2023-06-18 NOTE — [facility] Group Notes (Signed)
Psychoeducational Group Note  Date:  06/18/2023 Time:  8:30  Group Topic/Focus:  Wrap-Up Group:   The focus of this group is to help patients review their daily goal of treatment and discuss progress on daily workbooks.  Participation Level: Did Not Attend  Participation Quality:  Not Applicable  Affect:  Not Applicable  Cognitive:  Not Applicable  Insight:  Not Applicable  Engagement in Group: Not Applicable  Additional Comments:  Pt was in bed asleep during wrap-up time.  Lauralyn Shadowens, Sharen Counter 06/18/2023, 10:06 PM

## 2023-06-18 NOTE — Progress Notes (Signed)
[facility] MD Progress Note  06/18/2023 11:41 AM Yolanda [name]  MRN:  [id number]  Reason for admission:  This is the first psychiatric admission in this [facility] in 12 years for this AA female with an extensive hx of mental illnesses & probable polysubstance use disorders. She is admitted to the [facility] from the [facility] hospital with complain of worsening suicidal ideations with plan to stab herself. Per chart review, patient apparently reported at the ED that she has been depressed for a while & has not been taking her mental health medications. After medical evaluation.clearance, she was transferred to the [facility] for further psychiatric evaluation/treatments. During this evaluation, Yolanda [name] presents irritated, labile, angry & tearful. She is not forth-coming with the information needed to help manage her care.  She denies delusional thinking or paranoia.  Today's assessment notes:   Patient seen and examined lying on her bed this morning.  She is alert, and oriented to person, place, time and situation.  She reports being sleepy and not wanting to get up for therapeutic milieu and unit group activities.  When asked what her goal is for today, reports that she is still anticipating being discharged to [facility] in [place], [place] on Friday, 06/21/2023. Denies any acute discomfort or physical pain.  Denies delusional thinking or paranoia.  Patient continues to require inpatient psychiatric admission at this time until proper and safe placement is arranged upon discharge. Reports that anxiety is at manageable level Sleep is good.  Nursing staff report patient sleeping about 7.5 hours last night and feeling restful Appetite is good Concentration is improving Energy level is adequate She denies SI, HI, or AVH Denies having side effects to current psychiatric medications.   We discussed compliance to current medication regimen.  Principal Problem: Schizoaffective disorder, depressive type  (HCC) Diagnosis: Principal Problem:   Schizoaffective disorder, depressive type (HCC) Active Problems:   GERD (gastroesophageal reflux disease)   Intellectual disability   Tobacco use disorder  Total Time spent with patient: 35 minutes  Past Psychiatric History: Previous Psychotropic Medications: Mirtazapine, Invega sustenna, Gabapentin, Cogentin, Buspar, Naltrexone, Abilify.   Past Medical History:  Past Medical History:  Diagnosis Date   Bipolar affect, depressed (HCC)    Constipation 08/17/2022   Depression    Falls 07/21/2022   Fracture of femoral neck, right, closed (HCC) 01/17/2022   Herpes simplex 08/22/2017   Open fracture dislocation of right elbow joint 01/17/2022    Past Surgical History:  Procedure Laterality Date   NO PAST SURGERIES     SALPINGECTOMY     Family History: History reviewed. No pertinent family history. Family Psychiatric  History:  Patient reports that everyone in her family has bipolar disorder.  Social History:  Social History   Substance and Sexual Activity  Alcohol Use Yes     Social History   Substance and Sexual Activity  Drug Use Yes   Types: Cocaine, Marijuana    Social History   Socioeconomic History   Marital status: Single    Spouse name: Not on file   Number of children: Not on file   Years of education: Not on file   Highest education level: Not on file  Occupational History   Not on file  Tobacco Use   Smoking status: Every Day   Smokeless tobacco: Not on file  Substance and Sexual Activity   Alcohol use: Yes   Drug use: Yes    Types: Cocaine, Marijuana   Sexual activity: Yes  Other Topics  Concern   Not on file  Social History Narrative   Not on file   Social Determinants of Health   Financial Resource Strain: Low Risk  (09/12/2022)   Received from [facility]   Overall Financial Resource Strain (CARDIA)    Difficulty of Paying Living Expenses: Not hard at all  Food Insecurity: Patient  Declined (12/20/2022)   Hunger Vital Sign    Worried About Running Out of Food in the Last Year: Patient declined    Ran Out of Food in the Last Year: Patient declined  Transportation Needs: No Transportation Needs (12/20/2022)   PRAPARE - Administrator, Civil Service (Medical): No    Lack of Transportation (Non-Medical): No  Physical Activity: Not on file  Stress: No Stress Concern Present (07/17/2022)   Received from [facility] of Occupational Health - Occupational Stress Questionnaire    Feeling of Stress : Not at all  Social Connections: Unknown (07/16/2022)   Received from [facility]   Social Network    Social Network: Not on file   Additional Social History:    Sleep: Good  Appetite:  Fair  Current Medications: Current Facility-Administered Medications  Medication Dose Route Frequency Provider Last Rate Last Admin   acetaminophen (TYLENOL) tablet 650 mg  650 mg Oral Q6H PRN [name], NP   650 mg at 06/17/23 2058   alum & mag hydroxide-simeth (MAALOX/MYLANTA) 200-200-20 MG/5ML suspension 30 mL  30 mL Oral Q4H PRN Onuoha, Chinwendu V, NP   30 mL at 05/31/23 0801   busPIRone (BUSPAR) tablet 15 mg  15 mg Oral BID [name], NP   15 mg at 06/18/23 0843   cyanocobalamin (VITAMIN B12) injection 1,000 mcg  1,000 mcg Intramuscular Q30 days Sarita Bottom, MD   1,000 mcg at 06/02/23 1129   diphenhydrAMINE (BENADRYL) capsule 50 mg  50 mg Oral TID PRN [name], NP   50 mg at 06/03/23 1110   Or   diphenhydrAMINE (BENADRYL) injection 50 mg  50 mg Intramuscular TID PRN [name], NP       docusate sodium (COLACE) capsule 100 mg  100 mg Oral Daily Nkwenti, Tyler Aas, NP   100 mg at 06/18/23 0843   feeding supplement (ENSURE ENLIVE / ENSURE PLUS) liquid 237 mL  237 mL Oral TID BM Nkwenti, Doris, NP   237 mL at 06/17/23 1417   haloperidol (HALDOL) tablet 5 mg  5 mg Oral TID PRN [name], NP   5 mg at 06/03/23  1110   Or   haloperidol lactate (HALDOL) injection 5 mg  5 mg Intramuscular TID PRN [name], NP       hydrocortisone cream 1 %   Topical BID Cecilie Lowers, FNP   1 Application at 06/18/23 0843   hydrOXYzine (ATARAX) tablet 25 mg  25 mg Oral TID PRN [name], DO   25 mg at 05/27/23 1146   LORazepam (ATIVAN) tablet 2 mg  2 mg Oral TID PRN [name], NP   2 mg at 06/03/23 1110   Or   LORazepam (ATIVAN) injection 2 mg  2 mg Intramuscular TID PRN [name], NP       magnesium hydroxide (MILK OF MAGNESIA) suspension 30 mL  30 mL Oral Daily PRN [name], NP   30 mL at 06/10/23 2121   melatonin tablet 5 mg  5 mg Oral QHS [name], NP  5 mg at 06/17/23 2058   nicotine polacrilex (NICORETTE) gum 2 mg  2 mg Oral PRN [name], MD   2 mg at 06/17/23 1513   paliperidone (INVEGA) 24 hr tablet 6 mg  6 mg Oral Daily Nkwenti, Tyler Aas, NP   6 mg at 06/17/23 2058   pantoprazole (PROTONIX) EC tablet 40 mg  40 mg Oral Daily Nwoko, Nicole Kindred I, NP   40 mg at 06/18/23 0843   polyethylene glycol (MIRALAX / GLYCOLAX) packet 17 g  17 g Oral Daily PRN [name], MD   17 g at 06/07/23 1847   polyethylene glycol (MIRALAX / GLYCOLAX) packet 17 g  17 g Oral Daily [name], MD   17 g at 06/16/23 0917   sertraline (ZOLOFT) tablet 150 mg  150 mg Oral Daily Massengill, Harrold Donath, MD   150 mg at 06/18/23 0843   SUMAtriptan (IMITREX) tablet 25 mg  25 mg Oral BID PRN [name], NP   25 mg at 05/07/23 2112   traZODone (DESYREL) tablet 50 mg  50 mg Oral QHS [name], NP   50 mg at 06/17/23 2058   Vitamin D (Ergocalciferol) (DRISDOL) 1.25 MG (50000 UNIT) capsule 50,000 Units  50,000 Units Oral Q7 days [name], NP   50,000 Units at 06/15/23 1333    Lab Results:  Results for orders placed or performed during the hospital encounter of 12/20/22 (from the past 48 hour(s))  TSH     Status: Abnormal   Collection Time: 06/16/23  7:00 PM  Result Value Ref Range   TSH 4.673 (H)  0.350 - 4.500 uIU/mL    Comment: Performed by a 3rd Generation assay with a functional sensitivity of <=0.01 uIU/mL. Performed at [facility], [address] [place], [place] [address]   Hemoglobin A1c     Status: None   Collection Time: 06/16/23  7:00 PM  Result Value Ref Range   Hgb A1c MFr Bld 5.6 4.8 - 5.6 %    Comment: (NOTE) Pre diabetes:          5.7%-6.4%  Diabetes:              >6.4%  Glycemic control for   <7.0% adults with diabetes    Mean Plasma Glucose 114.02 mg/dL    Comment: Performed at [facility], [address], [place], [place] [address]  Lipid panel     Status: Abnormal   Collection Time: 06/16/23  7:00 PM  Result Value Ref Range   Cholesterol 256 (H) 0 - 200 mg/dL   Triglycerides 657 (H) <150 mg/dL   HDL 83 >84 mg/dL   Total CHOL/HDL Ratio 3.1 RATIO   VLDL 45 (H) 0 - 40 mg/dL   LDL Cholesterol 696 (H) 0 - 99 mg/dL    Comment:        Total Cholesterol/HDL:CHD Risk Coronary Heart Disease Risk Table                     Men   Women  1/2 Average Risk   3.4   3.3  Average Risk       5.0   4.4  2 X Average Risk   9.6   7.1  3 X Average Risk  23.4   11.0        Use the calculated Patient Ratio above and the CHD Risk Table to determine the patient's CHD Risk.        ATP III CLASSIFICATION (  LDL):  <100     mg/dL   Optimal  010-272  mg/dL   Near or Above                    Optimal  130-159  mg/dL   Borderline  536-644  mg/dL   High  >034     mg/dL   Very High Performed at [facility], [address], [place], [place] [address]    Blood Alcohol level:  Lab Results  Component Value Date   ETH <10 12/19/2022   ETH <10 11/21/2019   Metabolic Disorder Labs: Lab Results  Component Value Date   HGBA1C 5.6 06/16/2023   MPG 114.02 06/16/2023   MPG 79.58 12/21/2022   No results found for: "PROLACTIN" Lab Results  Component Value Date   CHOL 256 (H) 06/16/2023   TRIG 224 (H) 06/16/2023   HDL 83  06/16/2023   CHOLHDL 3.1 06/16/2023   VLDL 45 (H) 06/16/2023   LDLCALC 128 (H) 06/16/2023   LDLCALC 149 (H) 12/21/2022    Physical Findings: AIMS: Facial and Oral Movements Muscles of Facial Expression: None Lips and Perioral Area: None Jaw: None Tongue: None,Extremity Movements Upper (arms, wrists, hands, fingers): None Lower (legs, knees, ankles, toes): None, Trunk Movements Neck, shoulders, hips: None, Global Judgements Severity of abnormal movements overall : None Incapacitation due to abnormal movements: None Patient's awareness of abnormal movements: No Awareness, Dental Status Current problems with teeth and/or dentures?: No Does patient usually wear dentures?: No  CIWA:    COWS:     Musculoskeletal: Strength & Muscle Tone: within normal limits Gait & Station: normal Patient leans: N/A  Psychiatric Specialty Exam:  Presentation  General Appearance:  Appropriate for Environment; Casual  Eye Contact: Absent  Speech: Clear and Coherent  Speech Volume: Normal  Handedness: Right  Mood and Affect  Mood: Euthymic (Improving)  Affect: Congruent  Thought Process  Thought Processes: Coherent  Descriptions of Associations:Intact  Orientation:Full (Time, Place and Person)  Thought Content:Logical  History of Schizophrenia/Schizoaffective disorder:Yes  Duration of Psychotic Symptoms:Greater than six months  Hallucinations:Hallucinations: None Description of Auditory Hallucinations: Denies  Ideas of Reference:None  Suicidal Thoughts:Suicidal Thoughts: No SI Active Intent and/or Plan: -- (Denies) SI Passive Intent and/or Plan: -- (Denies)  Homicidal Thoughts:Homicidal Thoughts: No  Sensorium  Memory: Immediate Fair; Recent Fair  Judgment: Impaired  Insight: Shallow  Executive Functions  Concentration: Fair  Attention Span: Fair  Recall: [employer] of Knowledge: Fair  Language: Fair  Psychomotor Activity  Psychomotor  Activity:Psychomotor Activity: Normal  Assets  Assets: Communication Skills; Physical Health; Resilience  Sleep  Sleep:Sleep: Good Number of Hours of Sleep: 7.5  Physical Exam: Physical Exam Vitals and nursing note reviewed.  Constitutional:      Appearance: Normal appearance.  HENT:     Head: Normocephalic and atraumatic.     Mouth/Throat:     Mouth: Mucous membranes are moist.     Pharynx: Oropharynx is clear.  Eyes:     Extraocular Movements: Extraocular movements intact.  Cardiovascular:     Rate and Rhythm: Normal rate.     Pulses: Normal pulses.  Pulmonary:     Effort: Pulmonary effort is normal.  Abdominal:     Comments: Deferred  Genitourinary:    Comments: Deferred Musculoskeletal:        General: Normal range of motion.     Cervical back: Normal range of motion.  Skin:    General: Skin is warm.  Neurological:     General: No focal deficit present.     Mental Status: She is alert.  Psychiatric:        Mood and Affect: Mood normal.        Behavior: Behavior normal.    Review of Systems  Constitutional:  Negative for chills and fever.  HENT:  Negative for sore throat.   Eyes:  Negative for blurred vision.  Respiratory:  Negative for cough, sputum production, shortness of breath and wheezing.   Cardiovascular:  Negative for chest pain and palpitations.  Gastrointestinal:  Negative for constipation, diarrhea, heartburn, nausea and vomiting.  Genitourinary:  Negative for dysuria, frequency and urgency.  Musculoskeletal:  Negative for myalgias.  Skin:  Negative for itching and rash.  Neurological:  Negative for headaches.  Endo/Heme/Allergies:        See allergy listing  Psychiatric/Behavioral:  Negative for hallucinations and suicidal ideas.    Blood pressure 106/71, pulse 82, temperature 98.3 F (36.8 C), temperature source Oral, resp. rate 12, height 4\' 11"  (1.499 m), weight 63 kg, SpO2 97%. Body mass index is 28.05 kg/m.  Treatment Plan  Summary: Daily contact with patient to assess and evaluate symptoms and progress in treatment and Medication management   ASSESSMENT: Yolanda [name] is a 47 y.o. female  with a past psychiatric history of schizoaffective disorder, bipolar type, multiple prior suicide attempts, cognitive delay on disability, and a history of medication nonadherence.  Admitted to [facility] from [facility] with complaints of worsening suicidal ideations with plan to stab herself.   Diagnoses / Active Problems: Schizoaffective disorder bipolar type   PLAN: Safety and Monitoring:             --  INVOLUNTARY admission to inpatient psychiatric unit for safety, stabilization and treatment             -- Daily contact with patient to assess and evaluate symptoms and progress in treatment             -- Patient's case to be discussed in multi-disciplinary team meeting             -- Observation Level : q15 minute checks             -- Vital signs:  q12 hours             -- Precautions: suicide, elopement, and assault   2. Psychiatric Diagnoses and Treatment:  Trazodone 50 mg nightly for insomnia Melatonin 5 mg nightly for insomnia Zoloft 150 mg daily for depression Paliperidone 6 mg daily for psychosis BuSpar 15 mg twice daily for anxiety -- The risks/benefits/side-effects/alternatives to this medication were discussed in detail with the patient and time was given for questions. The patient consents to medication trial.              -- Metabolic profile and EKG monitoring obtained while on an atypical antipsychotic  BMI: - TSH: Pending Lipid Panel: Pending HbgA1c: Pending QTc: 432 on 04/13/2019             -- Encouraged patient to participate in unit milieu and in scheduled group therapies              -- Short Term Goals: Ability to identify changes in lifestyle to reduce recurrence of condition will improve and Ability to verbalize feelings will improve             -- Long Term Goals: Improvement in  symptoms so as ready for  discharge Other PRNS:  acetaminophen, alum & mag hydroxide-simeth, diphenhydrAMINE **OR** diphenhydrAMINE, haloperidol **OR** haloperidol lactate, hydrOXYzine, LORazepam **OR** LORazepam, magnesium hydroxide, nicotine polacrilex, polyethylene glycol, SUMAtriptan               3. Medical Issues Being Addressed:  Other medications include: scheduled Colace, hydrocortisone cream, Protonix 40 mg for GERD   # Vitamin D deficiency Vitamin D capsule 50,000 units q. 7 days   4. Discharge Planning:              -- Social work and case management to assist with discharge planning and identification of hospital follow-up needs prior to discharge             -- Estimated Discharge Date:                          Apparently patient now has a legal guardian. Placement is in progress. The patient may not have the option to go to the mother's house unless there is 24/7 supervision.  06/13/2023: LCSW, patient, and Guardian Ad Litem had a meeting yesterday regarding patient discharge yesterday 06/12/2023.  Please see social worker's notes. May have placement for next week at a [facility]             -- Discharge Concerns: Need to establish a safety plan; Medication compliance and effectiveness             -- Discharge Goals: Return home with outpatient referrals for mental health follow-up including medication management/psychotherapy    I certify that inpatient services furnished can reasonably be expected to improve the patient's condition.    Cecilie Lowers, FNP 06/18/2023, 11:41 AM Patient ID: Yolanda [name], female   DOB: 09/21/1975, 47 y.o.   MRN: [id number] Patient ID: Yolanda [name], female   DOB: 03/10/1976, 47 y.o.   MRN: [id number]

## 2023-06-18 NOTE — Group Note (Signed)
Recreation Therapy Group Note   Group Topic:Animal Assisted Therapy   Group Date: 06/18/2023 Start Time: 0950 End Time: 1030 Facilitators: Katheen Aslin-McCall, LRT,CTRS Location: [address] Dayroom   Animal-Assisted Activity (AAA) Program Checklist/Progress Notes Patient Eligibility Criteria Checklist & Daily Group note for Rec Tx Intervention  AAA/T Program Assumption of Risk Form signed by Patient/ or Parent Legal Guardian Yes  Patient understands his/her participation is voluntary Yes  Education: Charity fundraiser, Appropriate Animal Interaction   Education Outcome: Acknowledges education.    Affect/Mood: N/A   Participation Level: Did not attend    Clinical Observations/Individualized Feedback:      Plan: Continue to engage patient in RT group sessions 2-3x/week.   Tyyonna Soucy-McCall, LRT,CTRS 06/18/2023 12:37 PM

## 2023-06-18 NOTE — Progress Notes (Signed)
   06/18/23 0558  15 Minute Checks  Location Bedroom  Visual Appearance Calm  Behavior Sleeping  Sleep (Behavioral Health Patients Only)  Calculate sleep? (Click Yes once per 24 hr at 0600 safety check) Yes  Documented sleep last 24 hours 7.5

## 2023-06-18 NOTE — Discharge Planning (Signed)
LCSW and DSS Representative Thamas Jaegers attempted to reach out to the Group Home Representative Mrs. [name] on yesterday regarding updates, however received no answer. LCSW attempted to call on this morning for updates, however phone went to voicemail. LCSW left voice message requesting phone call back regarding updates. LCSW also is communicating with team via email regarding plan. No updates to provide at this time. LCSW will continue to follow and provide support to patient while in hospital.   Fernande Boyden, LCSW Clinical Social Worker [place] Ph: [phone]

## 2023-06-19 ENCOUNTER — Encounter (HOSPITAL_COMMUNITY): Payer: Self-pay

## 2023-06-19 DIAGNOSIS — F251 Schizoaffective disorder, depressive type: Secondary | ICD-10-CM | POA: Diagnosis not present

## 2023-06-19 NOTE — Discharge Planning (Signed)
LCSW received phone call from Group Home Representative Lucinda regarding updates. Mrs. Yolanda [name] reports her team is still working on getting the requested documents over to [facility] as they ran into some issues regarding the paperwork. Per Mrs. Lucinda, issue has been resolved. Mrs. Yolanda [name] reports that she is hoping that the process will still be expedited for the patient. Mrs. Yolanda [name] reports she will not be able to accept the patient prior to the enhanced rate being approved through [facility]. Mrs. Yolanda [name] asked if she could come and eat lunch with the patient  on tomorrow to provide encouragement regarding the process, and to inform the patient of her desire to still accept her into the group home. Mrs. Yolanda [name] was informed that LCSW would have to follow up with Supervisor or Unit Manager regarding this and will follow up to let her know. Mrs. Yolanda [name] reports she will do everything in her power to get this patient into her group home. If delayed from [facility], then she reports she wants to stay in constant contact with the patient until approved. No other needs were reported at this time.   LCSW will touch basis with Supervisor [name] to provide updates.   Fernande Boyden, LCSW Clinical Social Worker [place] Ph: [phone]

## 2023-06-19 NOTE — Plan of Care (Signed)
  Problem: Activity: Goal: Interest or engagement in activities will improve Outcome: Progressing   Problem: Health Behavior/Discharge Planning: Goal: Compliance with treatment plan for underlying cause of condition will improve Outcome: Progressing   Problem: Safety: Goal: Periods of time without injury will increase Outcome: Progressing   Problem: Medication: Goal: Compliance with prescribed medication regimen will improve Outcome: Progressing

## 2023-06-19 NOTE — Progress Notes (Signed)
D) Pt received calm, visible, participating in milieu, and in no acute distress. Pt A & O x4. Pt denies SI, HI, A/ V H, depression, anxiety and pain at this time. A) Pt encouraged to drink fluids. Pt encouraged to come to staff with needs. Pt encouraged to attend and participate in groups. Pt encouraged to set reachable goals.  R) Pt remained safe on unit, in no acute distress, will continue to assess.     06/19/23 2100  Psych Admission Type (Psych Patients Only)  Admission Status Involuntary  Psychosocial Assessment  Patient Complaints None  Eye Contact Fair  Facial Expression Animated  Affect Appropriate to circumstance  Speech Soft  Interaction Childlike  Motor Activity Slow  Appearance/Hygiene Disheveled  Behavior Characteristics Cooperative  Mood Pleasant  Thought Process  Coherency WDL  Content WDL  Delusions None reported or observed  Perception WDL  Hallucination None reported or observed  Judgment Impaired  Confusion None  Danger to Self  Current suicidal ideation? Denies  Agreement Not to Harm Self Yes  Description of Agreement verbal  Danger to Others  Danger to Others None reported or observed

## 2023-06-19 NOTE — Progress Notes (Signed)
   06/18/23 2115  Psych Admission Type (Psych Patients Only)  Admission Status Involuntary  Psychosocial Assessment  Patient Complaints Anxiety  Eye Contact Fair  Facial Expression Animated  Affect Appropriate to circumstance  Speech Soft  Interaction Assertive;Childlike  Motor Activity Slow  Appearance/Hygiene Disheveled  Behavior Characteristics Cooperative  Mood Pleasant  Thought Process  Coherency WDL  Content WDL  Delusions None reported or observed  Perception WDL  Hallucination None reported or observed  Judgment Impaired  Confusion None  Danger to Self  Current suicidal ideation? Denies  Agreement Not to Harm Self Yes  Description of Agreement verbal  Danger to Others  Danger to Others None reported or observed

## 2023-06-19 NOTE — Group Note (Signed)
Recreation Therapy Group Note   Group Topic:Team Building  Group Date: 06/19/2023 Start Time: 0930 End Time: 1000 Facilitators: Joleigh Mineau-McCall, LRT,CTRS Location: [address] Dayroom   Group Topic: Communication, Team Building, Problem Solving  Goal Area(s) Addresses:  Patient will effectively work with peer towards shared goal.  Patient will identify skills used to make activity successful.  Patient will identify how skills used during activity can be used to reach post d/c goals.   Intervention: STEM Activity  Group Description: Stage manager. In teams of 3-5, patients were given 12 plastic drinking straws and an equal length of masking tape. Using the materials provided, patients were asked to build a landing pad to catch a golf ball dropped from approximately 5 feet in the air. All materials were required to be used by the team in their design. LRT facilitated post-activity discussion.  Education: Pharmacist, community, Scientist, physiological, Discharge Planning   Education Outcome: Acknowledges education/In group clarification offered/Needs additional education.    Affect/Mood: N/A   Participation Level: Did not attend    Clinical Observations/Individualized Feedback:     Plan: Continue to engage patient in RT group sessions 2-3x/week.   Jemario Poitras-McCall, LRT,CTRS 06/19/2023 1:16 PM

## 2023-06-19 NOTE — Progress Notes (Incomplete)
CSW received information from [employer] Supervisor Yolanda [name] who reported [facility] group home owner, Yolanda [name] [phone]  is requesting a luncheon with pt. Mrs. Yolanda [name] also shared that she is waiting for approval thru [employer] to receive the Enhanced Rate for Sudiksha' stay at her home. Mrs. Yolanda [name] reported paperwork has been submitted and has been expedited. Mrs. Yolanda [name] reported she will not be able to receive pt until application is completed. Mrs. Yolanda [name] hopeful she will have approval soon. CSW will continue to follow and update Treatment Team.

## 2023-06-19 NOTE — [facility] Group Notes (Signed)
Spiritual care group on grief and loss facilitated by Chaplain Dyanne Carrel, Bcc  Group Goal: Support / Education around grief and loss  Members engage in facilitated group support and psycho-social education.  Group Description:  Following introductions and group rules, group members engaged in facilitated group dialogue and support around topic of loss, with particular support around experiences of loss in their lives. Group Identified types of loss (relationships / self / things) and identified patterns, circumstances, and changes that precipitate losses. Reflected on thoughts / feelings around loss, normalized grief responses, and recognized variety in grief experience. Group encouraged individual reflection on safe space and on the coping skills that they are already utilizing.  Group drew on Adlerian / Rogerian and narrative framework  Patient Progress: Yolanda [name] attended group.  Though verbal participation was minimal, she demonstrated engagement in the group conversation.

## 2023-06-19 NOTE — Plan of Care (Signed)
  Problem: Coping: Goal: Coping ability will improve Outcome: Progressing   Problem: Health Behavior/Discharge Planning: Goal: Identification of resources available to assist in meeting health care needs will improve Outcome: Progressing   Problem: Medication: Goal: Compliance with prescribed medication regimen will improve Outcome: Progressing   Problem: Self-Concept: Goal: Ability to disclose and discuss suicidal ideas will improve Outcome: Progressing

## 2023-06-19 NOTE — Progress Notes (Signed)
   06/19/23 0557  15 Minute Checks  Location Bedroom  Visual Appearance Calm  Behavior Sleeping  Sleep (Behavioral Health Patients Only)  Calculate sleep? (Click Yes once per 24 hr at 0600 safety check) Yes  Documented sleep last 24 hours 9

## 2023-06-19 NOTE — Group Note (Signed)
Date:  06/19/2023 Time:  10:17 AM  Group Topic/Focus:  Goals Group:   The focus of this group is to help patients establish daily goals to achieve during treatment and discuss how the patient can incorporate goal setting into their daily lives to aide in recovery. Orientation:   The focus of this group is to educate the patient on the purpose and policies of crisis stabilization and provide a format to answer questions about their admission.  The group details unit policies and expectations of patients while admitted.    Participation Level:  Did Not Attend  Participation Quality:   n/a  Affect:   n/a  Cognitive:   n/a  Insight: None  Engagement in Group:   n/a  Modes of Intervention:   n/a  Additional Comments:   Pt did not attend.  Edmund Hilda Emma Schupp 06/19/2023, 10:17 AM

## 2023-06-19 NOTE — Group Note (Signed)
LCSW Group Therapy Note   Group Date: 06/18/23 Start Time: 1100 End Time: 1200   .[facility] LCSW Group Therapy Note  Type of Therapy and Topic:  Group Therapy:  Healthy and Unhealthy Supports  Participation Level:  Did Not Attend   Description of Group:  Patients in this group were introduced to the idea of adding a variety of healthy supports to address the various needs in their lives, especially in reference to their plans and focus for the new year.  Patients discussed what additional healthy supports could be helpful in their recovery and wellness after discharge in order to prevent future hospitalizations.   An emphasis was placed on using counselor, doctor, therapy groups, 12-step groups, and problem-specific support groups to expand supports.    Therapeutic Goals:   1)  discuss importance of adding supports to stay well once out of the hospital  2)  compare healthy versus unhealthy supports and identify some examples of each  3)  generate ideas and descriptions of healthy supports that can be added  4)  offer mutual support about how to address unhealthy supports  5)  encourage active participation in and adherence to discharge plan    Summary of Patient Progress:  The patient was invited, did not attend  Therapeutic Modalities:   Motivational Interviewing Brief Solution-Focused Therapy  Steffanie Dunn, LCSWA 06/19/2023  9:54 AM

## 2023-06-19 NOTE — [facility] Group Notes (Signed)
Spirituality group facilitated by Chaplain Katy Mairin Lindsley, BCC.   Group Description: Group focused on topic of hope. Patients participated in facilitated discussion around topic, connecting with one another around experiences and definitions for hope. Group members engaged with visual explorer photos, reflecting on what hope looks like for them today. Group engaged in discussion around how their definitions of hope are present today in hospital.   Modalities: Psycho-social ed, Adlerian, Narrative, MI   Patient Progress: Did not attend.  

## 2023-06-19 NOTE — Progress Notes (Signed)
[facility] MD Progress Note  06/19/2023 3:30 PM Yolanda [name]  MRN:  [id number]  Reason for admission:  This is the first psychiatric admission in this [facility] in 12 years for this AA female with an extensive hx of mental illnesses & probable polysubstance use disorders. She is admitted to the [facility] from the [facility] hospital with complain of worsening suicidal ideations with plan to stab herself. Per chart review, patient apparently reported at the ED that she has been depressed for a while & has not been taking her mental health medications. After medical evaluation.clearance, she was transferred to the [facility] for further psychiatric evaluation/treatments. During this evaluation, Yolanda [name] presents irritated, labile, angry & tearful. She is not forth-coming with the information needed to help manage her care.  She denies delusional thinking or paranoia.  Daily notes: Yolanda [name] is seen in her room, chart reviewed. The chart findings discussed with the treatment team. She presents alert, oriented & aware of situation. She is visible on the unit, usually around lunch time. She reports, "I'm doing okay.  I have no complaint today. I will probably be out of my room around lunch time. Yolanda [name] currently denies any SIHI, AVH, delusional thoughts or paranoia. She does not appear to be responding to any internal stimuli. The SW has indicated that patient has been accepted by a group. This group home is apparently asking for an enhanced payment from patient's medicare/medicaid. The group home staff will be coming to visit patient at round lunch time tomorrow. If all work out, patient is likely to be discharged this coming Friday to this group home. Discussed this case with the attending psychiatrist. There are no changes made on the current plan of care. Will continue as already in progress.  Principal Problem: Schizoaffective disorder, depressive type (HCC) Diagnosis: Principal Problem:   Schizoaffective disorder,  depressive type (HCC) Active Problems:   GERD (gastroesophageal reflux disease)   Intellectual disability   Tobacco use disorder  Total Time spent with patient: 35 minutes  Past Psychiatric History: Previous Psychotropic Medications: Mirtazapine, Invega sustenna, Gabapentin, Cogentin, Buspar, Naltrexone, Abilify.   Past Medical History:  Past Medical History:  Diagnosis Date   Bipolar affect, depressed (HCC)    Constipation 08/17/2022   Depression    Falls 07/21/2022   Fracture of femoral neck, right, closed (HCC) 01/17/2022   Herpes simplex 08/22/2017   Open fracture dislocation of right elbow joint 01/17/2022    Past Surgical History:  Procedure Laterality Date   NO PAST SURGERIES     SALPINGECTOMY     Family History: History reviewed. No pertinent family history. Family Psychiatric  History:  Patient reports that everyone in her family has bipolar disorder.  Social History:  Social History   Substance and Sexual Activity  Alcohol Use Yes     Social History   Substance and Sexual Activity  Drug Use Yes   Types: Cocaine, Marijuana    Social History   Socioeconomic History   Marital status: Single    Spouse name: Not on file   Number of children: Not on file   Years of education: Not on file   Highest education level: Not on file  Occupational History   Not on file  Tobacco Use   Smoking status: Every Day   Smokeless tobacco: Not on file  Substance and Sexual Activity   Alcohol use: Yes   Drug use: Yes    Types: Cocaine, Marijuana   Sexual activity: Yes  Other Topics Concern  Not on file  Social History Narrative   Not on file   Social Determinants of Health   Financial Resource Strain: Low Risk  (09/12/2022)   Received from [facility], [facility]   Overall Financial Resource Strain (CARDIA)    Difficulty of Paying Living Expenses: Not hard at all  Food Insecurity: Patient Declined (12/20/2022)   Hunger Vital Sign    Worried About Running Out  of Food in the Last Year: Patient declined    Ran Out of Food in the Last Year: Patient declined  Transportation Needs: No Transportation Needs (12/20/2022)   PRAPARE - Administrator, Civil Service (Medical): No    Lack of Transportation (Non-Medical): No  Physical Activity: Not on file  Stress: No Stress Concern Present (07/17/2022)   Received from [facility], [facility] Occup[facility] - Occupational Stress Questionnaire    Feeling of Stress : Not at all  Social Connections: Unknown (07/16/2022)   Received from [facility], [facility]   Social Network    Social Network: Not on file   Additional Social History:    Sleep: Good  Appetite:  Fair  Current Medications: Current Facility-Administered Medications  Medication Dose Route Frequency Provider Last Rate Last Admin   acetaminophen (TYLENOL) tablet 650 mg  650 mg Oral Q6H PRN [name], NP   650 mg at 06/17/23 2058   alum & mag hydroxide-simeth (MAALOX/MYLANTA) 200-200-20 MG/5ML suspension 30 mL  30 mL Oral Q4H PRN Onuoha, Chinwendu V, NP   30 mL at 05/31/23 0801   busPIRone (BUSPAR) tablet 15 mg  15 mg Oral BID [name], NP   15 mg at 06/19/23 0835   cyanocobalamin (VITAMIN B12) injection 1,000 mcg  1,000 mcg Intramuscular Q30 days [name], Nadir, MD   1,000 mcg at 06/02/23 1129   diphenhydrAMINE (BENADRYL) capsule 50 mg  50 mg Oral TID PRN [name], NP   50 mg at 06/03/23 1110   Or   diphenhydrAMINE (BENADRYL) injection 50 mg  50 mg Intramuscular TID PRN [name], NP       docusate sodium (COLACE) capsule 100 mg  100 mg Oral Daily Nkwenti, Doris, NP   100 mg at 06/19/23 0835   feeding supplement (ENSURE ENLIVE / ENSURE PLUS) liquid 237 mL  237 mL Oral TID BM Nkwenti, Doris, NP   237 mL at 06/19/23 1355   haloperidol (HALDOL) tablet 5 mg  5 mg Oral TID PRN [name], NP   5 mg at 06/03/23 1110   Or   haloperidol lactate (HALDOL) injection 5 mg  5 mg  Intramuscular TID PRN [name], NP       hydrocortisone cream 1 %   Topical BID Cecilie Lowers, FNP   Given at 06/19/23 3295   hydrOXYzine (ATARAX) tablet 25 mg  25 mg Oral TID PRN [name], DO   25 mg at 05/27/23 1146   LORazepam (ATIVAN) tablet 2 mg  2 mg Oral TID PRN [name], NP   2 mg at 06/03/23 1110   Or   LORazepam (ATIVAN) injection 2 mg  2 mg Intramuscular TID PRN [name], NP       magnesium hydroxide (MILK OF MAGNESIA) suspension 30 mL  30 mL Oral Daily PRN [name], NP   30 mL at 06/10/23 2121   melatonin tablet 5 mg  5 mg Oral QHS [name], NP   5 mg at 06/18/23  2115   nicotine polacrilex (NICORETTE) gum 2 mg  2 mg Oral PRN [name], MD   2 mg at 06/18/23 1301   paliperidone (INVEGA) 24 hr tablet 6 mg  6 mg Oral Daily Nkwenti, Doris, NP   6 mg at 06/18/23 2115   pantoprazole (PROTONIX) EC tablet 40 mg  40 mg Oral Daily Katya Rolston, Nicole Kindred I, NP   40 mg at 06/19/23 0835   polyethylene glycol (MIRALAX / GLYCOLAX) packet 17 g  17 g Oral Daily PRN [name], MD   17 g at 06/07/23 1847   polyethylene glycol (MIRALAX / GLYCOLAX) packet 17 g  17 g Oral Daily [name], MD   17 g at 06/16/23 0917   sertraline (ZOLOFT) tablet 150 mg  150 mg Oral Daily Massengill, Harrold Donath, MD   150 mg at 06/19/23 1610   SUMAtriptan (IMITREX) tablet 25 mg  25 mg Oral BID PRN [name], NP   25 mg at 05/07/23 2112   traZODone (DESYREL) tablet 50 mg  50 mg Oral QHS [name], NP   50 mg at 06/18/23 2116   Vitamin D (Ergocalciferol) (DRISDOL) 1.25 MG (50000 UNIT) capsule 50,000 Units  50,000 Units Oral Q7 days [name], NP   50,000 Units at 06/15/23 1333    Lab Results:  No results found for this or any previous visit (from the past 48 hour(s)).  Blood Alcohol level:  Lab Results  Component Value Date   ETH <10 12/19/2022   ETH <10 11/21/2019   Metabolic Disorder Labs: Lab Results  Component Value Date   HGBA1C 5.6 06/16/2023   MPG 114.02  06/16/2023   MPG 79.58 12/21/2022   No results found for: "PROLACTIN" Lab Results  Component Value Date   CHOL 256 (H) 06/16/2023   TRIG 224 (H) 06/16/2023   HDL 83 06/16/2023   CHOLHDL 3.1 06/16/2023   VLDL 45 (H) 06/16/2023   LDLCALC 128 (H) 06/16/2023   LDLCALC 149 (H) 12/21/2022    Physical Findings: AIMS: Facial and Oral Movements Muscles of Facial Expression: None Lips and Perioral Area: None Jaw: None Tongue: None,Extremity Movements Upper (arms, wrists, hands, fingers): None Lower (legs, knees, ankles, toes): None, Trunk Movements Neck, shoulders, hips: None, Global Judgements Severity of abnormal movements overall : None Incapacitation due to abnormal movements: None Patient's awareness of abnormal movements: No Awareness, Dental Status Current problems with teeth and/or dentures?: No Does patient usually wear dentures?: No  CIWA:    COWS:     Musculoskeletal: Strength & Muscle Tone: within normal limits Gait & Station: normal Patient leans: N/A  Psychiatric Specialty Exam:  Presentation  General Appearance:  Appropriate for Environment; Casual  Eye Contact: Absent  Speech: Clear and Coherent  Speech Volume: Normal  Handedness: Right  Mood and Affect  Mood: Euthymic (Improving)  Affect: Congruent  Thought Process  Thought Processes: Coherent  Descriptions of Associations:Intact  Orientation:Full (Time, Place and Person)  Thought Content:Logical  History of Schizophrenia/Schizoaffective disorder:Yes  Duration of Psychotic Symptoms:Greater than six months  Hallucinations:Hallucinations: None Description of Auditory Hallucinations: Denies  Ideas of Reference:None  Suicidal Thoughts:Suicidal Thoughts: No SI Active Intent and/or Plan: -- (Denies) SI Passive Intent and/or Plan: -- (Denies)  Homicidal Thoughts:Homicidal Thoughts: No  Sensorium  Memory: Immediate Fair; Recent  Fair  Judgment: Impaired  Insight: Shallow  Executive Functions  Concentration: Fair  Attention Span: Fair  Recall: Fair  Fund of Knowledge: Fair  Language: Fair  Psychomotor Activity  Psychomotor Activity:Psychomotor Activity: Normal  Assets  Assets: Manufacturing systems engineer; Physical Health; Resilience  Sleep  Sleep:Sleep: Good Number of Hours of Sleep: 7.5  Physical Exam: Physical Exam Vitals and nursing note reviewed.  Constitutional:      Appearance: Normal appearance.  HENT:     Head: Normocephalic and atraumatic.     Mouth/Throat:     Mouth: Mucous membranes are moist.     Pharynx: Oropharynx is clear.  Eyes:     Extraocular Movements: Extraocular movements intact.  Cardiovascular:     Rate and Rhythm: Normal rate.     Pulses: Normal pulses.  Pulmonary:     Effort: Pulmonary effort is normal.  Abdominal:     Comments: Deferred  Genitourinary:    Comments: Deferred Musculoskeletal:        General: Normal range of motion.     Cervical back: Normal range of motion.  Skin:    General: Skin is warm.  Neurological:     General: No focal deficit present.     Mental Status: She is alert.  Psychiatric:        Mood and Affect: Mood normal.        Behavior: Behavior normal.    Review of Systems  Constitutional:  Negative for chills and fever.  HENT:  Negative for sore throat.   Eyes:  Negative for blurred vision.  Respiratory:  Negative for cough, sputum production, shortness of breath and wheezing.   Cardiovascular:  Negative for chest pain and palpitations.  Gastrointestinal:  Negative for constipation, diarrhea, heartburn, nausea and vomiting.  Genitourinary:  Negative for dysuria, frequency and urgency.  Musculoskeletal:  Negative for myalgias.  Skin:  Negative for itching and rash.  Neurological:  Negative for headaches.  Endo/Heme/Allergies:        See allergy listing  Psychiatric/Behavioral:  Negative for hallucinations and suicidal  ideas.    Blood pressure 103/78, pulse 83, temperature 98.1 F (36.7 C), temperature source Oral, resp. rate 12, height 4\' 11"  (1.499 m), weight 63 kg, SpO2 97%. Body mass index is 28.05 kg/m.  Treatment Plan Summary: Daily contact with patient to assess and evaluate symptoms and progress in treatment and Medication management   ASSESSMENT: Amaiah D Grosman is a 47 y.o. female  with a past psychiatric history of schizoaffective disorder, bipolar type, multiple prior suicide attempts, cognitive delay on disability, and a history of medication nonadherence.  Admitted to [facility] from [facility] with complaints of worsening suicidal ideations with plan to stab herself.   Diagnoses / Active Problems: Schizoaffective disorder bipolar type   PLAN: Safety and Monitoring:             --  INVOLUNTARY admission to inpatient psychiatric unit for safety, stabilization and treatment             -- Daily contact with patient to assess and evaluate symptoms and progress in treatment             -- Patient's case to be discussed in multi-disciplinary team meeting             -- Observation Level : q15 minute checks             -- Vital signs:  q12 hours             -- Precautions: suicide, elopement, and assault   2. Treatment plan. -Trazodone 50 mg nightly for insomnia -Melatonin 5 mg nightly for insomnia -Zoloft 150 mg daily for depression -Paliperidone 6 mg daily for psychosis -BuSpar  15 mg twice daily for anxiety.  -- The risks/benefits/side-effects/alternatives to this medication were discussed in detail with the patient and time was given for questions. The patient consents to medication trial.              -- Metabolic profile and EKG monitoring obtained while on an atypical antipsychotic.  Other PRNS:  acetaminophen, alum & mag hydroxide-simeth, diphenhydrAMINE **OR** diphenhydrAMINE, haloperidol **OR** haloperidol lactate, hydrOXYzine, LORazepam **OR** LORazepam, magnesium hydroxide,  nicotine polacrilex, polyethylene glycol, SUMAtriptan               3. Medical Issues Being Addressed:  Other medications include: scheduled Colace, hydrocortisone cream, Protonix 40 mg for GERD   # Vitamin D deficiency Vitamin D capsule 50,000 units q. 7 days   4. Discharge Planning:              -- Social work and case management to assist with discharge planning and identification of hospital follow-up needs prior to discharge             -- Estimated Discharge Date:               -- Apparently patient now has a legal guardian. Placement is in progress. The patient may not have the option to go to the mother's house unless there is 24/7 supervision.  06/13/2023: LCSW, patient, and Guardian Ad Litem had a meeting yesterday regarding patient discharge 06/12/2023.  Please see social worker's notes. May have placement for next week at a group home.               -- Discharge Concerns: Need to establish a safety plan; Medication compliance and effectiveness             -- Discharge Goals: Return home with outpatient referrals for mental health follow-up including medication management/psychotherapy    I certify that inpatient services furnished can reasonably be expected to improve the patient's condition.    Armandina Stammer, NP, pmhnp, fnp-bc. 06/19/2023, 3:30 PM Patient ID: Larey Brick, female   DOB: [birth year], 47 y.o.   MRN: [id number] Patient ID: SHARONN KILMER, female   DOB: 12-Apr-1976, 47 y.o.   MRN: [id number] Patient ID: SRIHITA PROTHEROE, female   DOB: 07-25-1975, 47 y.o.   MRN: [id number]

## 2023-06-19 NOTE — Progress Notes (Signed)
   06/19/23 1000  Psych Admission Type (Psych Patients Only)  Admission Status Involuntary  Psychosocial Assessment  Patient Complaints None  Eye Contact Fair  Facial Expression Animated  Affect Appropriate to circumstance  Speech Soft  Interaction Assertive;Childlike  Motor Activity Slow  Appearance/Hygiene Disheveled  Behavior Characteristics Cooperative  Mood Pleasant  Thought Process  Coherency WDL  Content WDL  Delusions None reported or observed  Perception WDL  Hallucination None reported or observed  Judgment Impaired  Confusion None  Danger to Self  Current suicidal ideation? Denies  Agreement Not to Harm Self Yes  Description of Agreement verbal  Danger to Others  Danger to Others None reported or observed

## 2023-06-19 NOTE — BH IP Treatment Plan (Signed)
Interdisciplinary Treatment and Diagnostic Plan Update  06/19/2023 Time of Session: 11:20AM - UPDATE Yolanda [name] MRN: [id number]  Principal Diagnosis: Schizoaffective disorder, depressive type (HCC)  Secondary Diagnoses: Principal Problem:   Schizoaffective disorder, depressive type (HCC) Active Problems:   GERD (gastroesophageal reflux disease)   Intellectual disability   Tobacco use disorder   Current Medications:  Current Facility-Administered Medications  Medication Dose Route Frequency Provider Last Rate Last Admin   acetaminophen (TYLENOL) tablet 650 mg  650 mg Oral Q6H PRN [name], NP   650 mg at 06/17/23 2058   alum & mag hydroxide-simeth (MAALOX/MYLANTA) 200-200-20 MG/5ML suspension 30 mL  30 mL Oral Q4H PRN Onuoha, Chinwendu V, NP   30 mL at 05/31/23 0801   busPIRone (BUSPAR) tablet 15 mg  15 mg Oral BID [name], NP   15 mg at 06/19/23 0835   cyanocobalamin (VITAMIN B12) injection 1,000 mcg  1,000 mcg Intramuscular Q30 days Sarita Bottom, MD   1,000 mcg at 06/02/23 1129   diphenhydrAMINE (BENADRYL) capsule 50 mg  50 mg Oral TID PRN [name], NP   50 mg at 06/03/23 1110   Or   diphenhydrAMINE (BENADRYL) injection 50 mg  50 mg Intramuscular TID PRN [name], NP       docusate sodium (COLACE) capsule 100 mg  100 mg Oral Daily Nkwenti, Tyler Aas, NP   100 mg at 06/19/23 8295   feeding supplement (ENSURE ENLIVE / ENSURE PLUS) liquid 237 mL  237 mL Oral TID BM Nkwenti, Doris, NP   237 mL at 06/19/23 1030   haloperidol (HALDOL) tablet 5 mg  5 mg Oral TID PRN [name], NP   5 mg at 06/03/23 1110   Or   haloperidol lactate (HALDOL) injection 5 mg  5 mg Intramuscular TID PRN [name], NP       hydrocortisone cream 1 %   Topical BID Cecilie Lowers, FNP   Given at 06/19/23 6213   hydrOXYzine (ATARAX) tablet 25 mg  25 mg Oral TID PRN [name], DO   25 mg at 05/27/23 1146   LORazepam (ATIVAN) tablet 2 mg  2 mg Oral TID PRN [name], NP   2 mg  at 06/03/23 1110   Or   LORazepam (ATIVAN) injection 2 mg  2 mg Intramuscular TID PRN [name], NP       magnesium hydroxide (MILK OF MAGNESIA) suspension 30 mL  30 mL Oral Daily PRN [name], NP   30 mL at 06/10/23 2121   melatonin tablet 5 mg  5 mg Oral QHS Nkwenti, Doris, NP   5 mg at 06/18/23 2115   nicotine polacrilex (NICORETTE) gum 2 mg  2 mg Oral PRN [name], MD   2 mg at 06/18/23 1301   paliperidone (INVEGA) 24 hr tablet 6 mg  6 mg Oral Daily [name], NP   6 mg at 06/18/23 2115   pantoprazole (PROTONIX) EC tablet 40 mg  40 mg Oral Daily Nwoko, Nicole Kindred I, NP   40 mg at 06/19/23 0835   polyethylene glycol (MIRALAX / GLYCOLAX) packet 17 g  17 g Oral Daily PRN [name], MD   17 g at 06/07/23 1847   polyethylene glycol (MIRALAX / GLYCOLAX) packet 17 g  17 g Oral Daily [name], MD   17 g at 06/16/23 0917   sertraline (ZOLOFT) tablet 150 mg  150 mg Oral Daily Massengill, Harrold Donath, MD   150 mg at 06/19/23 [phone]  SUMAtriptan (IMITREX) tablet 25 mg  25 mg Oral BID PRN [name], NP   25 mg at 05/07/23 2112   traZODone (DESYREL) tablet 50 mg  50 mg Oral QHS [name], NP   50 mg at 06/18/23 2116   Vitamin D (Ergocalciferol) (DRISDOL) 1.25 MG (50000 UNIT) capsule 50,000 Units  50,000 Units Oral Q7 days [name], NP   50,000 Units at 06/15/23 1333   PTA Medications: Medications Prior to Admission  Medication Sig Dispense Refill Last Dose   busPIRone (BUSPAR) 15 MG tablet Take 15 mg by mouth 2 (two) times daily. (Patient not taking: Reported on 12/19/2022)      paliperidone (INVEGA SUSTENNA) 156 MG/ML SUSY injection Inject 156 mg into the muscle once. (Patient not taking: Reported on 12/19/2022)      sertraline (ZOLOFT) 50 MG tablet Take 150 mg by mouth daily. (Patient not taking: Reported on 12/19/2022)      traZODone (DESYREL) 100 MG tablet Take 100 mg by mouth at bedtime as needed for sleep. (Patient not taking: Reported on 11/12/2022)        Patient Stressors: Medication change or noncompliance    Patient Strengths: Forensic psychologist fund of knowledge   Treatment Modalities: Medication Management, Group therapy, Case management,  1 to 1 session with clinician, Psychoeducation, Recreational therapy.   Physician Treatment Plan for Primary Diagnosis: Schizoaffective disorder, depressive type (HCC) Long Term Goal(s): Improvement in symptoms so as ready for discharge   Short Term Goals: Ability to identify changes in lifestyle to reduce recurrence of condition will improve Ability to verbalize feelings will improve  Medication Management: Evaluate patient's response, side effects, and tolerance of medication regimen.  Therapeutic Interventions: 1 to 1 sessions, Unit Group sessions and Medication administration.  Evaluation of Outcomes: Progressing  Physician Treatment Plan for Secondary Diagnosis: Principal Problem:   Schizoaffective disorder, depressive type (HCC) Active Problems:   GERD (gastroesophageal reflux disease)   Intellectual disability   Tobacco use disorder  Long Term Goal(s): Improvement in symptoms so as ready for discharge   Short Term Goals: Ability to identify changes in lifestyle to reduce recurrence of condition will improve Ability to verbalize feelings will improve     Medication Management: Evaluate patient's response, side effects, and tolerance of medication regimen.  Therapeutic Interventions: 1 to 1 sessions, Unit Group sessions and Medication administration.  Evaluation of Outcomes: Progressing   RN Treatment Plan for Primary Diagnosis: Schizoaffective disorder, depressive type (HCC) Long Term Goal(s): Knowledge of disease and therapeutic regimen to maintain health will improve  Short Term Goals: Ability to remain free from injury will improve, Ability to verbalize frustration and anger appropriately will improve, Ability to verbalize feelings will improve, Ability to  disclose and discuss suicidal ideas, and Ability to identify and develop effective coping behaviors will improve  Medication Management: RN will administer medications as ordered by provider, will assess and evaluate patient's response and provide education to patient for prescribed medication. RN will report any adverse and/or side effects to prescribing provider.  Therapeutic Interventions: 1 on 1 counseling sessions, Psychoeducation, Medication administration, Evaluate responses to treatment, Monitor vital signs and CBGs as ordered, Perform/monitor CIWA, COWS, AIMS and Fall Risk screenings as ordered, Perform wound care treatments as ordered.  Evaluation of Outcomes: Progressing   LCSW Treatment Plan for Primary Diagnosis: Schizoaffective disorder, depressive type (HCC) Long Term Goal(s): Safe transition to appropriate next level of care at discharge, Engage patient in therapeutic group addressing interpersonal concerns.  Short Term Goals:  Engage patient in aftercare planning with referrals and resources, Increase social support, Increase emotional regulation, Facilitate patient progression through stages of change regarding substance use diagnoses and concerns, and Increase skills for wellness and recovery  Therapeutic Interventions: Assess for all discharge needs, 1 to 1 time with Social worker, Explore available resources and support systems, Assess for adequacy in community support network, Educate family and significant other(s) on suicide prevention, Complete Psychosocial Assessment, Interpersonal group therapy.  Evaluation of Outcomes: Progressing   Progress in Treatment: Attending groups: Yes. Participating in groups: Yes. Taking medication as prescribed: Yes. Toleration medication: Yes. Family/Significant other contact made: Yes, individual(s) contacted:  Yolanda [name] [phone] Patient understands diagnosis: Yes. Discussing patient identified problems/goals with staff:  Yes. Medical problems stabilized or resolved: Yes. Denies suicidal/homicidal ideation: Yes. Issues/concerns per patient self-inventory: No. Other: none reported   New problem(s) identified: none reported   New Short Term/Long Term Goal(s): medication stabilization, elimination of SI thoughts, development of comprehensive mental wellness plan.      Patient Goals:  " placement"   Discharge Plan or Barriers: Patient recently admitted. CSW will continue to follow and assess for appropriate referrals and possible discharge planning.      Reason for Continuation of Hospitalization: Depression Suicidal ideation   Estimated Length of Stay: Pt still expected to discharge 12/13 to group home  Last 3 [place] Suicide Severity Risk Score: Flowsheet Row Ad[facility] (Current) [facility] from 12/19/2022 in [facility] at [place] [facility] from 11/12/2022 in [facility] Emergency Department at [facility]  C-SSRS RISK CATEGORY High Risk High Risk Moderate Risk       Last [facility] 2/9 Scores:     No data to display          Scribe for Treatment Team: Kathi Der, LCSWA 06/19/2023 1:41 PM

## 2023-06-20 DIAGNOSIS — F251 Schizoaffective disorder, depressive type: Secondary | ICD-10-CM | POA: Diagnosis not present

## 2023-06-20 NOTE — Group Note (Signed)
LCSW Group Therapy Note  Group Date: 06/20/2023 Start Time: 1100 End Time: 1200   Type of Therapy and Topic:  Group Therapy - Healthy vs Unhealthy Coping Skills  Participation Level:  Did Not Attend   Description of Group The focus of this group was to determine what unhealthy coping techniques typically are used by group members and what healthy coping techniques would be helpful in coping with various problems. Patients were guided in becoming aware of the differences between healthy and unhealthy coping techniques. Patients were asked to identify 2-3 healthy coping skills they would like to learn to use more effectively.  Therapeutic Goals Patients learned that coping is what human beings do all day long to deal with various situations in their lives Patients defined and discussed healthy vs unhealthy coping techniques Patients identified their preferred coping techniques and identified whether these were healthy or unhealthy Patients determined 2-3 healthy coping skills they would like to become more familiar with and use more often. Patients provided support and ideas to each other   Summary of Patient Progress:  Did not attend   Therapeutic Modalities Cognitive Behavioral Therapy Motivational Interviewing  Kathi Der, LCSWA 06/20/2023  3:05 PM

## 2023-06-20 NOTE — Progress Notes (Signed)
Pt scheduled to meet with group home owner [name] [phone] at 1:30 pm for luncheon. Mrs. Yolanda [name] did not show up for meeting. CSW called several times, messages left, no return call.   CSW contacted legal guardian with [facility] of [facility] [name], [phone] to share that the luncheon did not occur. DSS called group home owner, reported message left, no return call.   TOC Supervisor [name] made aware and she received a text from Mrs. Yolanda [name] who shared her [name] friend was killed in a car accident today. Mrs. Yolanda [name] received a call from a [employer] who asked her to come to the hospital to identify her friend's body for Mrs. Yolanda [name] was her only family. Mrs. Yolanda [name] will reach  out to [facility] tomorrow.  CSW will continue to follow and update Treatment Team.

## 2023-06-20 NOTE — Progress Notes (Signed)
   06/20/23 0900  Psych Admission Type (Psych Patients Only)  Admission Status Involuntary  Psychosocial Assessment  Patient Complaints None  Eye Contact Fair  Facial Expression Animated  Affect Appropriate to circumstance  Speech Soft  Interaction Assertive;Childlike  Motor Activity Slow;Unsteady  Appearance/Hygiene Disheveled  Behavior Characteristics Cooperative  Mood Pleasant  Thought Process  Coherency WDL  Content WDL  Delusions None reported or observed  Perception WDL  Hallucination None reported or observed  Judgment Impaired  Confusion None  Danger to Self  Current suicidal ideation? Denies  Agreement Not to Harm Self Yes  Description of Agreement verbal  Danger to Others  Danger to Others None reported or observed

## 2023-06-20 NOTE — Discharge Planning (Signed)
LCSW attempted to get in contact with Group Home Representative [name] multiple times on today regarding updates, however received no answer. Voice message left on today requesting phone call back. This clinician is unsure if [facility] made visit on today with patient. LCSW will follow up in the morning regarding updates.   Fernande Boyden, LCSW Clinical Social Worker [place] Ph: [phone]

## 2023-06-20 NOTE — Plan of Care (Signed)
  Problem: Education: Goal: Ability to make informed decisions regarding treatment will improve Outcome: Progressing   Problem: Health Behavior/Discharge Planning: Goal: Identification of resources available to assist in meeting health care needs will improve Outcome: Progressing   Problem: Education: Goal: Knowledge of [facility] Education information/materials will improve Outcome: Progressing   Problem: Activity: Goal: Sleeping patterns will improve Outcome: Progressing

## 2023-06-20 NOTE — Progress Notes (Signed)
[facility] MD Progress Note  06/20/2023 9:00 AM Natassja ZARAH BOYS  MRN:  [id number]  Reason for admission:  This is the first psychiatric admission in this [facility] in 12 years for this AA female with an extensive hx of mental illnesses & probable polysubstance use disorders. She is admitted to the [facility] from the [facility] hospital with complain of worsening suicidal ideations with plan to stab herself. Per chart review, patient apparently reported at the ED that she has been depressed for a while & has not been taking her mental health medications. After medical evaluation.clearance, she was transferred to the [facility] for further psychiatric evaluation/treatments. During this evaluation, Brooks presents irritated, labile, angry & tearful. She is not forth-coming with the information needed to help manage her care.  She denies delusional thinking or paranoia.  Daily notes: Quandra is seen today, chart reviewed. The chart findings discussed with the treatment team. She presents alert, oriented & aware of situation. She is visible on the unit most of today. She presents happy, excited about the meeting she had scheduled with the staff from the new placed group home. She dressed-up in her Sunday best & had her make-up on. She has been smiling ear to ear all through the afternoon in an anticipation of her much awaited meeting with the staff from the group home. The SW later informed this provider that the group home staff did not make the scheduled 1:30 PM appointment & has not been answering the phone. At this time, patient continues to gather & park-up her belongings with hope that she will get discharged in the morning or sometime tomorrow. She currently denies any SIHI, AVH, delusional thoughts or paranoia. She does not appear to be responding to any internal stimuli. There are no changes made on her current plan of care. Will continue as already in progress.  Principal Problem: Schizoaffective disorder, depressive type  (HCC) Diagnosis: Principal Problem:   Schizoaffective disorder, depressive type (HCC) Active Problems:   GERD (gastroesophageal reflux disease)   Intellectual disability   Tobacco use disorder  Total Time spent with patient: 35 minutes  Past Psychiatric History: Previous Psychotropic Medications: Mirtazapine, Invega sustenna, Gabapentin, Cogentin, Buspar, Naltrexone, Abilify.   Past Medical History:  Past Medical History:  Diagnosis Date   Bipolar affect, depressed (HCC)    Constipation 08/17/2022   Depression    Falls 07/21/2022   Fracture of femoral neck, right, closed (HCC) 01/17/2022   Herpes simplex 08/22/2017   Open fracture dislocation of right elbow joint 01/17/2022    Past Surgical History:  Procedure Laterality Date   NO PAST SURGERIES     SALPINGECTOMY     Family History: History reviewed. No pertinent family history. Family Psychiatric  History:  Patient reports that everyone in her family has bipolar disorder.  Social History:  Social History   Substance and Sexual Activity  Alcohol Use Yes     Social History   Substance and Sexual Activity  Drug Use Yes   Types: Cocaine, Marijuana    Social History   Socioeconomic History   Marital status: Single    Spouse name: Not on file   Number of children: Not on file   Years of education: Not on file   Highest education level: Not on file  Occupational History   Not on file  Tobacco Use   Smoking status: Every Day   Smokeless tobacco: Not on file  Substance and Sexual Activity   Alcohol use: Yes   Drug use:  Yes    Types: Cocaine, Marijuana   Sexual activity: Yes  Other Topics Concern   Not on file  Social History Narrative   Not on file   Social Drivers of Health   Financial Resource Strain: Low Risk  (09/12/2022)   Received from [facility], [facility]   Overall Financial Resource Strain (CARDIA)    Difficulty of Paying Living Expenses: Not hard at all  Food Insecurity: Patient Declined  (12/20/2022)   Hunger Vital Sign    Worried About Running Out of Food in the Last Year: Patient declined    Ran Out of Food in the Last Year: Patient declined  Transportation Needs: No Transportation Needs (12/20/2022)   PRAPARE - Administrator, Civil Service (Medical): No    Lack of Transportation (Non-Medical): No  Physical Activity: Not on file  Stress: No Stress Concern Present (07/17/2022)   Received from [facility], [facility] - Occupational Stress Questionnaire    Feeling of Stress : Not at all  Social Connections: Unknown (07/16/2022)   Received from [facility], [facility]   Social Network    Social Network: Not on file   Additional Social History:    Sleep: Good  Appetite:  Fair  Current Medications: Current Facility-Administered Medications  Medication Dose Route Frequency Provider Last Rate Last Admin   acetaminophen (TYLENOL) tablet 650 mg  650 mg Oral Q6H PRN [name], NP   650 mg at 06/17/23 2058   alum & mag hydroxide-simeth (MAALOX/MYLANTA) 200-200-20 MG/5ML suspension 30 mL  30 mL Oral Q4H PRN Onuoha, Chinwendu V, NP   30 mL at 05/31/23 0801   busPIRone (BUSPAR) tablet 15 mg  15 mg Oral BID [name], NP   15 mg at 06/20/23 0749   cyanocobalamin (VITAMIN B12) injection 1,000 mcg  1,000 mcg Intramuscular Q30 days [name], Nadir, MD   1,000 mcg at 06/02/23 1129   diphenhydrAMINE (BENADRYL) capsule 50 mg  50 mg Oral TID PRN [name], NP   50 mg at 06/03/23 1110   Or   diphenhydrAMINE (BENADRYL) injection 50 mg  50 mg Intramuscular TID PRN [name], NP       docusate sodium (COLACE) capsule 100 mg  100 mg Oral Daily Nkwenti, Doris, NP   100 mg at 06/20/23 0750   feeding supplement (ENSURE ENLIVE / ENSURE PLUS) liquid 237 mL  237 mL Oral TID BM Nkwenti, Doris, NP   237 mL at 06/19/23 2032   haloperidol (HALDOL) tablet 5 mg  5 mg Oral TID PRN [name], NP   5 mg at 06/03/23 1110   Or    haloperidol lactate (HALDOL) injection 5 mg  5 mg Intramuscular TID PRN [name], NP       hydrocortisone cream 1 %   Topical BID Cecilie Lowers, FNP   Given at 06/20/23 0750   hydrOXYzine (ATARAX) tablet 25 mg  25 mg Oral TID PRN [name], DO   25 mg at 05/27/23 1146   LORazepam (ATIVAN) tablet 2 mg  2 mg Oral TID PRN [name], NP   2 mg at 06/03/23 1110   Or   LORazepam (ATIVAN) injection 2 mg  2 mg Intramuscular TID PRN [name], NP       magnesium hydroxide (MILK OF MAGNESIA) suspension 30 mL  30 mL Oral Daily PRN [name], NP   30 mL at 06/10/23 2121  melatonin tablet 5 mg  5 mg Oral QHS Nkwenti, Doris, NP   5 mg at 06/19/23 2031   nicotine polacrilex (NICORETTE) gum 2 mg  2 mg Oral PRN [name], MD   2 mg at 06/19/23 1829   paliperidone (INVEGA) 24 hr tablet 6 mg  6 mg Oral Daily [name], NP   6 mg at 06/19/23 2033   pantoprazole (PROTONIX) EC tablet 40 mg  40 mg Oral Daily Neetu Carrozza, Nicole Kindred I, NP   40 mg at 06/20/23 0750   polyethylene glycol (MIRALAX / GLYCOLAX) packet 17 g  17 g Oral Daily PRN [name], MD   17 g at 06/07/23 1847   polyethylene glycol (MIRALAX / GLYCOLAX) packet 17 g  17 g Oral Daily [name], MD   17 g at 06/16/23 0917   sertraline (ZOLOFT) tablet 150 mg  150 mg Oral Daily Massengill, Harrold Donath, MD   150 mg at 06/20/23 0750   SUMAtriptan (IMITREX) tablet 25 mg  25 mg Oral BID PRN [name], NP   25 mg at 05/07/23 2112   traZODone (DESYREL) tablet 50 mg  50 mg Oral QHS [name], NP   50 mg at 06/19/23 2031   Vitamin D (Ergocalciferol) (DRISDOL) 1.25 MG (50000 UNIT) capsule 50,000 Units  50,000 Units Oral Q7 days [name], NP   50,000 Units at 06/15/23 1333    Lab Results:  No results found for this or any previous visit (from the past 48 hours).  Blood Alcohol level:  Lab Results  Component Value Date   ETH <10 12/19/2022   ETH <10 11/21/2019   Metabolic Disorder Labs: Lab Results  Component  Value Date   HGBA1C 5.6 06/16/2023   MPG 114.02 06/16/2023   MPG 79.58 12/21/2022   No results found for: "PROLACTIN" Lab Results  Component Value Date   CHOL 256 (H) 06/16/2023   TRIG 224 (H) 06/16/2023   HDL 83 06/16/2023   CHOLHDL 3.1 06/16/2023   VLDL 45 (H) 06/16/2023   LDLCALC 128 (H) 06/16/2023   LDLCALC 149 (H) 12/21/2022    Physical Findings: AIMS: Facial and Oral Movements Muscles of Facial Expression: None Lips and Perioral Area: None Jaw: None Tongue: None,Extremity Movements Upper (arms, wrists, hands, fingers): None Lower (legs, knees, ankles, toes): None, Trunk Movements Neck, shoulders, hips: None, Global Judgements Severity of abnormal movements overall : None Incapacitation due to abnormal movements: None Patient's awareness of abnormal movements: No Awareness, Dental Status Current problems with teeth and/or dentures?: No Does patient usually wear dentures?: No  CIWA:    COWS:     Musculoskeletal: Strength & Muscle Tone: within normal limits Gait & Station: normal Patient leans: N/A  Psychiatric Specialty Exam:  Presentation  General Appearance:  Appropriate for Environment; Casual  Eye Contact: Absent  Speech: Clear and Coherent  Speech Volume: Normal  Handedness: Right  Mood and Affect  Mood: Euthymic (Improving)  Affect: Congruent  Thought Process  Thought Processes: Coherent  Descriptions of Associations:Intact  Orientation:Full (Time, Place and Person)  Thought Content:Logical  History of Schizophrenia/Schizoaffective disorder:Yes  Duration of Psychotic Symptoms:Greater than six months  Hallucinations:No data recorded  Ideas of Reference:None  Suicidal Thoughts:No data recorded  Homicidal Thoughts:No data recorded  Sensorium  Memory: Immediate Fair; Recent Fair  Judgment: Impaired  Insight: Shallow  Executive Functions  Concentration: Fair  Attention Span: Fair  Recall: Fair  Fund of  Knowledge: Fair  Language: Fair  Psychomotor Activity  Psychomotor Activity:No data recorded  Assets  Assets: Manufacturing systems engineer; Physical Health; Resilience  Sleep  Sleep:No data recorded  Physical Exam: Physical Exam Vitals and nursing note reviewed.  Constitutional:      Appearance: Normal appearance.  HENT:     Head: Normocephalic and atraumatic.     Mouth/Throat:     Mouth: Mucous membranes are moist.     Pharynx: Oropharynx is clear.  Eyes:     Extraocular Movements: Extraocular movements intact.  Cardiovascular:     Rate and Rhythm: Normal rate.     Pulses: Normal pulses.  Pulmonary:     Effort: Pulmonary effort is normal.  Abdominal:     Comments: Deferred  Genitourinary:    Comments: Deferred Musculoskeletal:        General: Normal range of motion.     Cervical back: Normal range of motion.  Skin:    General: Skin is warm.  Neurological:     General: No focal deficit present.     Mental Status: She is alert.  Psychiatric:        Mood and Affect: Mood normal.        Behavior: Behavior normal.    Review of Systems  Constitutional:  Negative for chills and fever.  HENT:  Negative for sore throat.   Eyes:  Negative for blurred vision.  Respiratory:  Negative for cough, sputum production, shortness of breath and wheezing.   Cardiovascular:  Negative for chest pain and palpitations.  Gastrointestinal:  Negative for constipation, diarrhea, heartburn, nausea and vomiting.  Genitourinary:  Negative for dysuria, frequency and urgency.  Musculoskeletal:  Negative for myalgias.  Skin:  Negative for itching and rash.  Neurological:  Negative for headaches.  Endo/Heme/Allergies:        See allergy listing  Psychiatric/Behavioral:  Negative for hallucinations and suicidal ideas.    Blood pressure 100/78, pulse 80, temperature 98.1 F (36.7 C), temperature source Oral, resp. rate 12, height 4\' 11"  (1.499 m), weight 63 kg, SpO2 97%. Body mass index is 28.05  kg/m.  Treatment Plan Summary: Daily contact with patient to assess and evaluate symptoms and progress in treatment and Medication management   ASSESSMENT: Khara D Brissett is a 47 y.o. female  with a past psychiatric history of schizoaffective disorder, bipolar type, multiple prior suicide attempts, cognitive delay on disability, and a history of medication nonadherence.  Admitted to [facility] from [facility] with complaints of worsening suicidal ideations with plan to stab herself.   Diagnoses / Active Problems: Schizoaffective disorder bipolar type   PLAN: Safety and Monitoring:             --  INVOLUNTARY admission to inpatient psychiatric unit for safety, stabilization and treatment             -- Daily contact with patient to assess and evaluate symptoms and progress in treatment             -- Patient's case to be discussed in multi-disciplinary team meeting             -- Observation Level : q15 minute checks             -- Vital signs:  q12 hours             -- Precautions: suicide, elopement, and assault   2. Treatment plan. -Trazodone 50 mg nightly for insomnia - Melatonin 5 mg nightly for insomnia - Zoloft 150 mg daily for depression - Paliperidone 6 mg daily for psychosis - BuSpar 15  mg twice daily for anxiety.  -- The risks/benefits/side-effects/alternatives to this medication were discussed in detail with the patient and time was given for questions. The patient consents to medication trial.              -- Metabolic profile and EKG monitoring obtained while on an atypical antipsychotic.  Other PRNS:  acetaminophen, alum & mag hydroxide-simeth, diphenhydrAMINE **OR** diphenhydrAMINE, haloperidol **OR** haloperidol lactate, hydrOXYzine, LORazepam **OR** LORazepam, magnesium hydroxide, nicotine polacrilex, polyethylene glycol, SUMAtriptan               3. Medical Issues Being Addressed:  Other medications include: scheduled Colace, hydrocortisone cream, Protonix 40  mg for GERD   # Vitamin D deficiency Vitamin D capsule 50,000 units q. 7 days   4. Discharge Planning:              -- Social work and case management to assist with discharge planning and identification of hospital follow-up needs prior to discharge             -- Estimated Discharge Date:               -- Apparently patient now has a legal guardian. Placement is in progress. The patient may not have the option to go to the mother's house unless there is 24/7 supervision.  06/13/2023: LCSW, patient, and Guardian Ad Litem had a meeting yesterday regarding patient discharge 06/12/2023.  Please see social worker's notes. May have placement for next week at a group home.               -- Discharge Concerns: Need to establish a safety plan; Medication compliance and effectiveness             -- Discharge Goals: Return home with outpatient referrals for mental health follow-up including medication management/psychotherapy    I certify that inpatient services furnished can reasonably be expected to improve the patient's condition.    Armandina Stammer, NP, pmhnp, fnp-bc. 06/20/2023, 9:00 AM Patient ID: Larey Brick, female   DOB: 10-02-1975, 47 y.o.   MRN: [id number] Patient ID: RONNAE OSMOND, female   DOB: 1975-10-26, 47 y.o.   MRN: [id number] Patient ID: AREAL KOHTZ, female   DOB: 25-Dec-1975, 47 y.o.   MRN: [id number] Patient ID: MARGARETMARY SORIA, female   DOB: [birth year], 47 y.o.   MRN: [id number]

## 2023-06-20 NOTE — [facility] Group Notes (Signed)
[facility] Group Notes:  (Nursing/MHT/Case Management/Adjunct) dult Psychoeducational Group Note  Date:  06/20/2023 Time:  9:26 AM  Group Topic/Focus:  Goals Group:   The focus of this group is to help patients establish daily goals to achieve during treatment and discuss how the patient can incorporate goal setting into their daily lives to aide in recovery. Orientation:   The focus of this group is to educate the patient on the purpose and policies of crisis stabilization and provide a format to answer questions about their admission.  The group details unit policies and expectations of patients while admitted.  Participation Level:  Did Not Attend    Additional Comments:  Did not attend Lucilla Edin 06/20/2023, 9:25 AM

## 2023-06-20 NOTE — Group Note (Signed)
Date:  06/20/2023 Time:  5:24 AM  Group Topic/Focus:  Wrap-Up Group:   The focus of this group is to help patients review their daily goal of treatment and discuss progress on daily workbooks.    Participation Level:  Active  Participation Quality:  Appropriate and Sharing  Affect:  Appropriate  Cognitive:  Appropriate  Insight: Appropriate and Improving  Engagement in Group:  Engaged and Improving  Modes of Intervention:  Activity and Socialization  Additional Comments:  Patient stated that she is doing "okay" and she had a "good day". Patient stated that she slept most of the day and did not do much. Patient shared that she is being discharged on Friday. Patient rated her day a 10/10. Patient participated in group activity at the end of the group.   Kennieth Francois 06/20/2023, 5:24 AM

## 2023-06-21 DIAGNOSIS — F251 Schizoaffective disorder, depressive type: Secondary | ICD-10-CM | POA: Diagnosis not present

## 2023-06-21 NOTE — Progress Notes (Signed)
   06/21/23 0545  15 Minute Checks  Location Bedroom  Visual Appearance Calm  Behavior Sleeping  Sleep (Behavioral Health Patients Only)  Calculate sleep? (Click Yes once per 24 hr at 0600 safety check) Yes  Documented sleep last 24 hours 7.75

## 2023-06-21 NOTE — Progress Notes (Signed)
[name] MD Progress Note  06/21/2023 4:22 PM Yolanda [name]  MRN:  [id number]  Reason for admission:  This is the first psychiatric admission in this [facility] in 12 years for this AA female with an extensive hx of mental illnesses & probable polysubstance use disorders. She is admitted to the [facility] from the [facility] hospital with complain of worsening suicidal ideations with plan to stab herself. Per chart review, patient apparently reported at the ED that she has been depressed for a while & has not been taking her mental health medications. After medical evaluation.clearance, she was transferred to the [facility] for further psychiatric evaluation/treatments. During this evaluation,  presents irritated, labile, angry & tearful. She is not forth-coming with the information needed to help manage her care.  She denies delusional thinking or paranoia.  Daily notes: Yolanda [name] is seen today in her room with the SW, chart reviewed. The chart findings discussed with the treatment team. She presents alert, oriented & aware of situation. She is visible on the unit hoping to get discharged to a group home today. She presents this time very eager to hear that she is actually getting discharged today.  At this time, the news is not what she is anticipating. She was scheduled to be discharged today to a group home setting. However, the owner of the group had a family emergency that made her unable to make a 1:30 pm appointment scheduled for yesterday to meet with Yolanda [name]. Today again, the plan to discharge Yolanda [name] to this group home is hanging in limbo as the owner of the group home is currently handling the family emergency that occurred. Patient has been informed that her discharge to a group home may not happen today & she is not happy about it. She is currently saddened & crying. Patient has been busy gathering & parking-up her belongings with hope that she was leaving this hospital today. She currently denies any SIHI,  VH, delusional thoughts or paranoia. She did report that she heard a voice telling her that she is not going to get discharged today. She does not however, appear to be responding to any internal stimuli. There are no changes made on her current plan of care. Will continue as already in progress. Support & encouragement being provided for her by the staff.   Principal Problem: Schizoaffective disorder, depressive type (HCC) Diagnosis: Principal Problem:   Schizoaffective disorder, depressive type (HCC) Active Problems:   GERD (gastroesophageal reflux disease)   Intellectual disability   Tobacco use disorder  Total Time spent with patient: 35 minutes  Past Psychiatric History: Previous Psychotropic Medications: Mirtazapine, Invega sustenna, Gabapentin, Cogentin, Buspar, Naltrexone, Abilify.   Past Medical History:  Past Medical History:  Diagnosis Date   Bipolar affect, depressed (HCC)    Constipation 08/17/2022   Depression    Falls 07/21/2022   Fracture of femoral neck, right, closed (HCC) 01/17/2022   Herpes simplex 08/22/2017   Open fracture dislocation of right elbow joint 01/17/2022    Past Surgical History:  Procedure Laterality Date   NO PAST SURGERIES     SALPINGECTOMY     Family History: History reviewed. No pertinent family history. Family Psychiatric  History:  Patient reports that everyone in her family has bipolar disorder.  Social History:  Social History   Substance and Sexual Activity  Alcohol Use Yes     Social History   Substance and Sexual Activity  Drug Use Yes   Types: Cocaine, Marijuana    Social History  Socioeconomic History   Marital status: Single    Spouse name: Not on file   Number of children: Not on file   Years of education: Not on file   Highest education level: Not on file  Occupational History   Not on file  Tobacco Use   Smoking status: Every Day   Smokeless tobacco: Not on file  Substance and Sexual Activity   Alcohol use:  Yes   Drug use: Yes    Types: Cocaine, Marijuana   Sexual activity: Yes  Other Topics Concern   Not on file  Social History Narrative   Not on file   Social Drivers of Health   Financial Resource Strain: Low Risk  (09/12/2022)   Received from [facility], [facility]   Overall Financial Resource Strain (CARDIA)    Difficulty of Paying Living Expenses: Not hard at all  Food Insecurity: Patient Declined (12/20/2022)   Hunger Vital Sign    Worried About Running Out of Food in the Last Year: Patient declined    Ran Out of Food in the Last Year: Patient declined  Transportation Needs: No Transportation Needs (12/20/2022)   PRAPARE - Administrator, Civil Service (Medical): No    Lack of Transportation (Non-Medical): No  Physical Activity: Not on file  Stress: No Stress Concern Present (07/17/2022)   Received from [facility], [facility] of Occupational Health - Occupational Stress Questionnaire    Feeling of Stress : Not at all  Social Connections: Unknown (07/16/2022)   Received from [facility], [facility]   Social Network    Social Network: Not on file   Additional Social History:    Sleep: Good  Appetite:  Fair  Current Medications: Current Facility-Administered Medications  Medication Dose Route Frequency Provider Last Rate Last Admin   acetaminophen (TYLENOL) tablet 650 mg  650 mg Oral Q6H PRN [name], NP   650 mg at 06/20/23 1858   alum & mag hydroxide-simeth (MAALOX/MYLANTA) 200-200-20 MG/5ML suspension 30 mL  30 mL Oral Q4H PRN Onuoha, Chinwendu V, NP   30 mL at 05/31/23 0801   busPIRone (BUSPAR) tablet 15 mg  15 mg Oral BID [name], NP   15 mg at 06/21/23 1616   cyanocobalamin (VITAMIN B12) injection 1,000 mcg  1,000 mcg Intramuscular Q30 days Sarita Bottom, MD   1,000 mcg at 06/02/23 1129   diphenhydrAMINE (BENADRYL) capsule 50 mg  50 mg Oral TID PRN [name], NP   50 mg at 06/03/23 1110   Or    diphenhydrAMINE (BENADRYL) injection 50 mg  50 mg Intramuscular TID PRN [name], NP       docusate sodium (COLACE) capsule 100 mg  100 mg Oral Daily Nkwenti, Doris, NP   100 mg at 06/20/23 0750   feeding supplement (ENSURE ENLIVE / ENSURE PLUS) liquid 237 mL  237 mL Oral TID BM Nkwenti, Doris, NP   237 mL at 06/21/23 1111   haloperidol (HALDOL) tablet 5 mg  5 mg Oral TID PRN [name], NP   5 mg at 06/03/23 1110   Or   haloperidol lactate (HALDOL) injection 5 mg  5 mg Intramuscular TID PRN [name], NP       hydrocortisone cream 1 %   Topical BID Cecilie Lowers, FNP   Given at 06/21/23 1616   hydrOXYzine (ATARAX) tablet 25 mg  25 mg Oral TID PRN [name], DO   25 mg  at 05/27/23 1146   LORazepam (ATIVAN) tablet 2 mg  2 mg Oral TID PRN [name], NP   2 mg at 06/03/23 1110   Or   LORazepam (ATIVAN) injection 2 mg  2 mg Intramuscular TID PRN [name], NP       magnesium hydroxide (MILK OF MAGNESIA) suspension 30 mL  30 mL Oral Daily PRN [name], NP   30 mL at 06/10/23 2121   melatonin tablet 5 mg  5 mg Oral QHS Nkwenti, Doris, NP   5 mg at 06/20/23 2118   nicotine polacrilex (NICORETTE) gum 2 mg  2 mg Oral PRN [name], MD   2 mg at 06/21/23 0815   paliperidone (INVEGA) 24 hr tablet 6 mg  6 mg Oral Daily [name], NP   6 mg at 06/20/23 2118   pantoprazole (PROTONIX) EC tablet 40 mg  40 mg Oral Daily Chandel Zaun, Nicole Kindred I, NP   40 mg at 06/21/23 0809   polyethylene glycol (MIRALAX / GLYCOLAX) packet 17 g  17 g Oral Daily PRN [name], MD   17 g at 06/07/23 1847   polyethylene glycol (MIRALAX / GLYCOLAX) packet 17 g  17 g Oral Daily [name], MD   17 g at 06/16/23 0917   sertraline (ZOLOFT) tablet 150 mg  150 mg Oral Daily Massengill, Harrold Donath, MD   150 mg at 06/21/23 0810   SUMAtriptan (IMITREX) tablet 25 mg  25 mg Oral BID PRN [name], NP   25 mg at 05/07/23 2112   traZODone (DESYREL) tablet 50 mg  50 mg Oral QHS [name], NP   50  mg at 06/20/23 2118   Vitamin D (Ergocalciferol) (DRISDOL) 1.25 MG (50000 UNIT) capsule 50,000 Units  50,000 Units Oral Q7 days [name], NP   50,000 Units at 06/15/23 1333    Lab Results:  No results found for this or any previous visit (from the past 48 hours).  Blood Alcohol level:  Lab Results  Component Value Date   ETH <10 12/19/2022   ETH <10 11/21/2019   Metabolic Disorder Labs: Lab Results  Component Value Date   HGBA1C 5.6 06/16/2023   MPG 114.02 06/16/2023   MPG 79.58 12/21/2022   No results found for: "PROLACTIN" Lab Results  Component Value Date   CHOL 256 (H) 06/16/2023   TRIG 224 (H) 06/16/2023   HDL 83 06/16/2023   CHOLHDL 3.1 06/16/2023   VLDL 45 (H) 06/16/2023   LDLCALC 128 (H) 06/16/2023   LDLCALC 149 (H) 12/21/2022    Physical Findings: AIMS: Facial and Oral Movements Muscles of Facial Expression: None Lips and Perioral Area: None Jaw: None Tongue: None,Extremity Movements Upper (arms, wrists, hands, fingers): None Lower (legs, knees, ankles, toes): None, Trunk Movements Neck, shoulders, hips: None, Global Judgements Severity of abnormal movements overall : None Incapacitation due to abnormal movements: None Patient's awareness of abnormal movements: No Awareness, Dental Status Current problems with teeth and/or dentures?: No Does patient usually wear dentures?: No  CIWA:    COWS:     Musculoskeletal: Strength & Muscle Tone: within normal limits Gait & Station: normal Patient leans: N/A  Psychiatric Specialty Exam:  Presentation  General Appearance:  Casual; Fairly Groomed  Eye Contact: Fair  Speech: Clear and Coherent; Normal Rate  Speech Volume: Normal  Handedness: Right  Mood and Affect  Mood: -- (Saddened because she is unsure of her pending discharge.)  Affect: Congruent  Thought Process  Thought Processes: Coherent; Goal Directed  Descriptions of Associations:Intact  Orientation:Full (Time, Place and  Person)  Thought Content:Logical  History of Schizophrenia/Schizoaffective disorder:Yes  Duration of Psychotic Symptoms:Greater than six months  Hallucinations:Hallucinations: Auditory Description of Auditory Hallucinations: "The voice told me that I may not get discharged today".   Ideas of Reference:None  Suicidal Thoughts:Suicidal Thoughts: No SI Active Intent and/or Plan: Without Intent; Without Plan; Without Means to Carry Out; Without Access to Means SI Passive Intent and/or Plan: Without Intent; Without Plan; Without Means to Carry Out; Without Access to Means   Homicidal Thoughts:Homicidal Thoughts: No   Sensorium  Memory: Immediate Good; Recent Good; Remote Fair  Judgment: Fair  Insight: Good  Executive Functions  Concentration: Fair  Attention Span: Fair  Recall: Good  Fund of Knowledge: Fair  Language: Good  Psychomotor Activity  Psychomotor Activity:Psychomotor Activity: Normal   Assets  Assets: Communication Skills; Desire for Improvement; Financial Resources/Insurance; Housing; Resilience; Social Support  Sleep  Sleep:Sleep: Good Number of Hours of Sleep: 8   Physical Exam: Physical Exam Vitals and nursing note reviewed.  Constitutional:      Appearance: Normal appearance.  HENT:     Head: Normocephalic and atraumatic.     Mouth/Throat:     Mouth: Mucous membranes are moist.     Pharynx: Oropharynx is clear.  Eyes:     Extraocular Movements: Extraocular movements intact.  Cardiovascular:     Rate and Rhythm: Normal rate.     Pulses: Normal pulses.  Pulmonary:     Effort: Pulmonary effort is normal.  Abdominal:     Comments: Deferred  Genitourinary:    Comments: Deferred Musculoskeletal:        General: Normal range of motion.     Cervical back: Normal range of motion.  Skin:    General: Skin is warm.  Neurological:     General: No focal deficit present.     Mental Status: She is alert.  Psychiatric:        Mood  and Affect: Mood normal.        Behavior: Behavior normal.    Review of Systems  Constitutional:  Negative for chills and fever.  HENT:  Negative for sore throat.   Eyes:  Negative for blurred vision.  Respiratory:  Negative for cough, sputum production, shortness of breath and wheezing.   Cardiovascular:  Negative for chest pain and palpitations.  Gastrointestinal:  Negative for constipation, diarrhea, heartburn, nausea and vomiting.  Genitourinary:  Negative for dysuria, frequency and urgency.  Musculoskeletal:  Negative for myalgias.  Skin:  Negative for itching and rash.  Neurological:  Negative for headaches.  Endo/Heme/Allergies:        See allergy listing  Psychiatric/Behavioral:  Negative for hallucinations and suicidal ideas.    Blood pressure 117/74, pulse 88, temperature 98.1 F (36.7 C), temperature source Oral, resp. rate 12, height 4\' 11"  (1.499 m), weight 63 kg, SpO2 97%. Body mass index is 28.05 kg/m.  Treatment Plan Summary: Daily contact with patient to assess and evaluate symptoms and progress in treatment and Medication management   ASSESSMENT: Dellene D Pizzimenti is a 47 y.o. female  with a past psychiatric history of schizoaffective disorder, bipolar type, multiple prior suicide attempts, cognitive delay on disability, and a history of medication nonadherence.  Admitted to [facility] from [facility] with complaints of worsening suicidal ideations with plan to stab herself.   Diagnoses / Active Problems: Schizoaffective disorder bipolar type   PLAN: Safety and Monitoring:             --  INVOLUNTARY admission to inpatient psychiatric unit for safety, stabilization and treatment             -- Daily contact with patient to assess and evaluate symptoms and progress in treatment             -- Patient's case to be discussed in multi-disciplinary team meeting             -- Observation Level : q15 minute checks             -- Vital signs:  q12 hours              -- Precautions: suicide, elopement, and assault   2. Treatment plan. -Trazodone 50 mg nightly for insomnia - Melatonin 5 mg nightly for insomnia - Zoloft 150 mg daily for depression - Paliperidone 6 mg daily for psychosis - BuSpar 15 mg twice daily for anxiety.  -- The risks/benefits/side-effects/alternatives to this medication were discussed in detail with the patient and time was given for questions. The patient consents to medication trial.              -- Metabolic profile and EKG monitoring obtained while on an atypical antipsychotic.  Other PRNS:  acetaminophen, alum & mag hydroxide-simeth, diphenhydrAMINE **OR** diphenhydrAMINE, haloperidol **OR** haloperidol lactate, hydrOXYzine, LORazepam **OR** LORazepam, magnesium hydroxide, nicotine polacrilex, polyethylene glycol, SUMAtriptan               3. Medical Issues Being Addressed:  Other medications include: scheduled Colace, hydrocortisone cream, Protonix 40 mg for GERD   # Vitamin D deficiency Vitamin D capsule 50,000 units q. 7 days   4. Discharge Planning:              -- Social work and case management to assist with discharge planning and identification of hospital follow-up needs prior to discharge             -- Estimated Discharge Date:               -- Apparently patient now has a legal guardian. Placement is in progress. The patient may not have the option to go to the mother's house unless there is 24/7 supervision.  06/13/2023: LCSW, patient, and Guardian Ad Litem had a meeting yesterday regarding patient discharge 06/12/2023.  Please see social worker's notes. May have placement for next week at a group home.               -- Discharge Concerns: Need to establish a safety plan; Medication compliance and effectiveness             -- Discharge Goals: Return home with outpatient referrals for mental health follow-up including medication management/psychotherapy    I certify that inpatient services furnished can  reasonably be expected to improve the patient's condition.    Armandina Stammer, NP, pmhnp, fnp-bc. 06/21/2023, 4:22 PM Patient ID: Larey Brick, female   DOB: 1976/02/11, 47 y.o.   MRN: [id number] Patient ID: HOWARD MESSNER, female   DOB: 05/19/1976, 47 y.o.   MRN: [id number] Patient ID: MEEGAN LATTNER, female   DOB: 06/04/1976, 47 y.o.   MRN: [id number] Patient ID: SOPHYA MORAL, female   DOB: 1975-10-14, 47 y.o.   MRN: [id number] Patient ID: FLORECE SPIESS, female   DOB: 11-07-1975, 47 y.o.   MRN: [id number]

## 2023-06-21 NOTE — Group Note (Signed)
Recreation Therapy Group Note   Group Topic:Stress Management  Group Date: 06/21/2023 Start Time: 0947 End Time: 1009 Facilitators: Rhett Mutschler-McCall, LRT,CTRS Location: [address] Dayroom   Group Topic: Stress Management   Goal Area(s) Addresses:  Patient will actively participate in stress management techniques presented during session.  Patient will successfully identify benefit of practicing stress management post d/c.   Behavioral Response: Appropriate  Intervention: Relaxation exercise with ambient sound and script   Group Description: Guided Imagery. LRT provided education, instruction, and demonstration on practice of visualization via guided imagery. Patient was asked to participate in the technique introduced during session. LRT debriefed including topics of mindfulness, stress management and specific scenarios each patient could use these techniques. Patients were given suggestions of ways to access scripts post d/c and encouraged to explore Youtube and other apps available on smartphones, tablets, and computers.  Education: Stress Management, Discharge Planning.   Education Outcome: Acknowledges education   Affect/Mood: N/A   Participation Level: Did not attend    Clinical Observations/Individualized Feedback:      Plan: Continue to engage patient in RT group sessions 2-3x/week.   Herold Salguero-McCall, LRT,CTRS 06/21/2023 12:02 PM

## 2023-06-21 NOTE — Plan of Care (Signed)
  Problem: Coping: Goal: Coping ability will improve Outcome: Progressing   Problem: Medication: Goal: Compliance with prescribed medication regimen will improve Outcome: Progressing   Problem: Activity: Goal: Interest or engagement in activities will improve Outcome: Progressing

## 2023-06-21 NOTE — Plan of Care (Signed)
  Problem: Coping: Goal: Coping ability will improve Outcome: Progressing   Problem: Medication: Goal: Compliance with prescribed medication regimen will improve Outcome: Progressing   Problem: Education: Goal: Emotional status will improve Outcome: Progressing Goal: Mental status will improve Outcome: Progressing   Problem: Activity: Goal: Interest or engagement in activities will improve Outcome: Progressing   

## 2023-06-21 NOTE — Progress Notes (Signed)
   06/21/23 1000  Psych Admission Type (Psych Patients Only)  Admission Status Involuntary  Psychosocial Assessment  Patient Complaints None  Eye Contact Fair  Facial Expression Animated  Affect Appropriate to circumstance  Speech Soft  Interaction Assertive;Childlike  Motor Activity Slow;Unsteady  Appearance/Hygiene Disheveled  Behavior Characteristics Cooperative  Mood Pleasant  Thought Process  Coherency WDL  Content WDL  Delusions None reported or observed  Perception WDL  Hallucination None reported or observed  Judgment Impaired  Confusion None  Danger to Self  Current suicidal ideation? Denies  Agreement Not to Harm Self Yes  Description of Agreement verbal  Danger to Others  Danger to Others None reported or observed   Patient alert and oriented. Patient denies SI, HI, AVH, and pain.  Scheduled medications administered to patient, per provider orders. Support and encouragement provided. Routine safety checks conducted every 15 minutes. Patient verbally contracts for safety and remains safe on the unit.

## 2023-06-21 NOTE — Discharge Planning (Addendum)
LCSW contacted Dr. Cheree Ditto with [facility] to inquire about submission of paperwork for patient. Per Dr. Cheree Ditto, they are finishing the paperwork up today to submit. Dr. Cheree Ditto reports once it has been submitted then he is unsure how long the process will take with [facility] but he will be asking for an expedited process as the patient is in the hospital. This was the same information provided to this clinician on 06/19/2023. Update provided to the [facility] Supervisor Doyle, as the likelihood of the patient discharging today may not happen. Awaiting update from Group Home Representative Lucinda. LCSW will continue to provide updates as received.  Fernande Boyden, LCSW Clinical Social Worker [place] Ph: [phone]

## 2023-06-21 NOTE — Progress Notes (Signed)
   06/20/23 2118  Psych Admission Type (Psych Patients Only)  Admission Status Involuntary  Psychosocial Assessment  Patient Complaints None  Eye Contact Fair  Facial Expression Animated  Affect Appropriate to circumstance  Speech Soft  Interaction Assertive;Childlike  Motor Activity Slow;Unsteady  Appearance/Hygiene Disheveled  Behavior Characteristics Cooperative  Mood Pleasant  Thought Process  Coherency WDL  Content WDL  Delusions None reported or observed  Perception WDL  Hallucination None reported or observed  Judgment Impaired  Confusion None  Danger to Self  Current suicidal ideation? Denies  Danger to Others  Danger to Others None reported or observed

## 2023-06-21 NOTE — Discharge Planning (Signed)
Phone call held with LCSW, Group Home Representative [name], QP Dr. Cheree Ditto, [facility] Supervisor [name], and [facility] Manager Baldo Daub regarding patient. Group Home is currently still in the process of completing paperwork. Group Home informed team that they would have to submit for Out of Network/Single Case Agreement for patient. Plan is for completed paperwork to be submitted and expedited to [facility] on today. Agency is unsure of how long the [facility] department will take to process and approve the patient. Updates will be provided on Monday as received. Agency aware of patient's length of stay and will provide this to [facility] department. No other needs were reported at this time by parties involved. LCSW will continue to follow and provide updates as received.  Fernande Boyden, LCSW Clinical Social Worker [place] Ph: [phone]

## 2023-06-21 NOTE — Progress Notes (Signed)
   06/21/23 2100  Psych Admission Type (Psych Patients Only)  Admission Status Involuntary  Psychosocial Assessment  Patient Complaints None  Eye Contact Fair  Facial Expression Animated  Affect Appropriate to circumstance  Speech Soft  Interaction Assertive;Childlike  Motor Activity Tremors  Appearance/Hygiene Disheveled  Behavior Characteristics Cooperative  Mood Pleasant  Thought Process  Coherency WDL  Content WDL  Delusions None reported or observed  Perception WDL  Hallucination None reported or observed  Judgment Impaired  Confusion None  Danger to Self  Current suicidal ideation? Denies  Danger to Others  Danger to Others None reported or observed

## 2023-06-21 NOTE — [facility] Group Notes (Signed)
Adult Psychoeducational Group Note  Date:  06/21/2023 Time:  10:26 AM  Group Topic/Focus:  Goals Group:   The focus of this group is to help patients establish daily goals to achieve during treatment and discuss how the patient can incorporate goal setting into their daily lives to aide in recovery.  Participation Level:  Active  Participation Quality:  Appropriate and Attentive  Affect:  Appropriate  Cognitive:  Appropriate  Insight: Good  Engagement in Group:  Engaged  Modes of Intervention:  Exploration  Additional Comments:  Pt participated in group. Pt stated their goal is connect with family upon discharge, obtain GED one day and take it one day at a time. Pt did not identify or indorse signs of SI/HI    Yolanda [name] 06/21/2023, 10:26 AM

## 2023-06-21 NOTE — Plan of Care (Signed)
  Problem: Education: Goal: Knowledge of [facility] Education information/materials will improve Outcome: Progressing Goal: Emotional status will improve Outcome: Progressing Goal: Mental status will improve Outcome: Progressing Goal: Verbalization of understanding the information provided will improve Outcome: Progressing   Problem: Activity: Goal: Interest or engagement in activities will improve Outcome: Progressing Goal: Sleeping patterns will improve Outcome: Progressing   Problem: Coping: Goal: Ability to verbalize frustrations and anger appropriately will improve Outcome: Progressing Goal: Ability to demonstrate self-control will improve Outcome: Progressing   Problem: Health Behavior/Discharge Planning: Goal: Identification of resources available to assist in meeting health care needs will improve Outcome: Progressing Goal: Compliance with treatment plan for underlying cause of condition will improve Outcome: Progressing   Problem: Physical Regulation: Goal: Ability to maintain clinical measurements within normal limits will improve Outcome: Progressing   Problem: Safety: Goal: Periods of time without injury will increase Outcome: Progressing   Problem: Education: Goal: Ability to make informed decisions regarding treatment will improve Outcome: Progressing   Problem: Coping: Goal: Coping ability will improve Outcome: Progressing   Problem: Health Behavior/Discharge Planning: Goal: Identification of resources available to assist in meeting health care needs will improve Outcome: Progressing   Problem: Medication: Goal: Compliance with prescribed medication regimen will improve Outcome: Progressing   Problem: Self-Concept: Goal: Ability to disclose and discuss suicidal ideas will improve Outcome: Progressing Goal: Will verbalize positive feelings about self Outcome: Progressing Note: Patient is on track. Patient will maintain adherence

## 2023-06-22 NOTE — Progress Notes (Signed)
[facility] MD Progress Note  06/22/2023 11:30 AM Yolanda [name]  MRN:  [id number]  Reason for admission:  This is the first psychiatric admission in this [facility] in 12 years for this AA female with an extensive hx of mental illnesses & probable polysubstance use disorders. She is admitted to the [facility] from the [facility] hospital with complain of worsening suicidal ideations with plan to stab herself. Per chart review, patient apparently reported at the ED that she has been depressed for a while & has not been taking her mental health medications. After medical evaluation.clearance, she was transferred to the [facility] for further psychiatric evaluation/treatments. During this evaluation, Yolanda [name] presents irritated, labile, angry & tearful. She is not forth-coming with the information needed to help manage her care.  She denies delusional thinking or paranoia.  Daily notes:  Yolanda [name] was seen in her room during rounds.  She reported that her mood was "good" today.  She denies any new psychiatric or medical complaints.  She reports that her appetite is good.  She reports that focus and concentration are adequate.  She denies issues with energy.  She reports adequate sleep.  She denies any medication side effects.  She denies suicidal ideations, homicidal ideations, auditory hallucinations, visual hallucinations, or delusions.   Principal Problem: Schizoaffective disorder, depressive type (HCC) Diagnosis: Principal Problem:   Schizoaffective disorder, depressive type (HCC) Active Problems:   GERD (gastroesophageal reflux disease)   Intellectual disability   Tobacco use disorder  Total Time spent with patient: 35 minutes  Past Psychiatric History: Previous Psychotropic Medications: Mirtazapine, Invega sustenna, Gabapentin, Cogentin, Buspar, Naltrexone, Abilify.   Past Medical History:  Past Medical History:  Diagnosis Date   Bipolar affect, depressed (HCC)    Constipation 08/17/2022   Depression    Falls  07/21/2022   Fracture of femoral neck, right, closed (HCC) 01/17/2022   Herpes simplex 08/22/2017   Open fracture dislocation of right elbow joint 01/17/2022    Past Surgical History:  Procedure Laterality Date   NO PAST SURGERIES     SALPINGECTOMY     Family History: History reviewed. No pertinent family history. Family Psychiatric  History:  Patient reports that everyone in her family has bipolar disorder.  Social History:  Social History   Substance and Sexual Activity  Alcohol Use Yes     Social History   Substance and Sexual Activity  Drug Use Yes   Types: Cocaine, Marijuana    Social History   Socioeconomic History   Marital status: Single    Spouse name: Not on file   Number of children: Not on file   Years of education: Not on file   Highest education level: Not on file  Occupational History   Not on file  Tobacco Use   Smoking status: Every Day   Smokeless tobacco: Not on file  Substance and Sexual Activity   Alcohol use: Yes   Drug use: Yes    Types: Cocaine, Marijuana   Sexual activity: Yes  Other Topics Concern   Not on file  Social History Narrative   Not on file   Social Drivers of Health   Financial Resource Strain: Low Risk  (09/12/2022)   Received from [facility]   Overall Financial Resource Strain (CARDIA)    Difficulty of Paying Living Expenses: Not hard at all  Food Insecurity: Patient Declined (12/20/2022)   Hunger Vital Sign    Worried About Running Out of Food in the Last Year: Patient declined  Ran Out of Food in the Last Year: Patient declined  Transportation Needs: No Transportation Needs (12/20/2022)   PRAPARE - Administrator, Civil Service (Medical): No    Lack of Transportation (Non-Medical): No  Physical Activity: Not on file  Stress: No Stress Concern Present (07/17/2022)   Received from [facility], [facility] - Occupational Stress Questionnaire     Feeling of Stress : Not at all  Social Connections: Unknown (07/16/2022)   Received from [facility], [facility]   Social Network    Social Network: Not on file   Additional Social History:    Sleep: Good  Appetite:  Fair  Current Medications: Current Facility-Administered Medications  Medication Dose Route Frequency Provider Last Rate Last Admin   acetaminophen (TYLENOL) tablet 650 mg  650 mg Oral Q6H PRN [name], NP   650 mg at 06/20/23 1858   alum & mag hydroxide-simeth (MAALOX/MYLANTA) 200-200-20 MG/5ML suspension 30 mL  30 mL Oral Q4H PRN Onuoha, Chinwendu V, NP   30 mL at 05/31/23 0801   busPIRone (BUSPAR) tablet 15 mg  15 mg Oral BID [name], NP   15 mg at 06/22/23 1040   cyanocobalamin (VITAMIN B12) injection 1,000 mcg  1,000 mcg Intramuscular Q30 days Sarita Bottom, MD   1,000 mcg at 06/02/23 1129   diphenhydrAMINE (BENADRYL) capsule 50 mg  50 mg Oral TID PRN [name], NP   50 mg at 06/03/23 1110   Or   diphenhydrAMINE (BENADRYL) injection 50 mg  50 mg Intramuscular TID PRN [name], NP       docusate sodium (COLACE) capsule 100 mg  100 mg Oral Daily [name], NP   100 mg at 06/22/23 1041   feeding supplement (ENSURE ENLIVE / ENSURE PLUS) liquid 237 mL  237 mL Oral TID BM Nkwenti, Doris, NP   237 mL at 06/21/23 2014   haloperidol (HALDOL) tablet 5 mg  5 mg Oral TID PRN [name], NP   5 mg at 06/03/23 1110   Or   haloperidol lactate (HALDOL) injection 5 mg  5 mg Intramuscular TID PRN [name], NP       hydrocortisone cream 1 %   Topical BID Cecilie Lowers, FNP   Given at 06/22/23 1042   hydrOXYzine (ATARAX) tablet 25 mg  25 mg Oral TID PRN [name], DO   25 mg at 05/27/23 1146   LORazepam (ATIVAN) tablet 2 mg  2 mg Oral TID PRN [name], NP   2 mg at 06/03/23 1110   Or   LORazepam (ATIVAN) injection 2 mg  2 mg Intramuscular TID PRN [name], NP       magnesium hydroxide (MILK OF MAGNESIA) suspension 30 mL  30 mL Oral  Daily PRN [name], NP   30 mL at 06/10/23 2121   melatonin tablet 5 mg  5 mg Oral QHS Nkwenti, Doris, NP   5 mg at 06/21/23 2014   nicotine polacrilex (NICORETTE) gum 2 mg  2 mg Oral PRN [name], MD   2 mg at 06/21/23 0815   paliperidone (INVEGA) 24 hr tablet 6 mg  6 mg Oral Daily [name], NP   6 mg at 06/21/23 2014   pantoprazole (PROTONIX) EC tablet 40 mg  40 mg Oral Daily [name], NP   40 mg at 06/22/23 1041   polyethylene glycol (MIRALAX / GLYCOLAX) packet 17 g  17 g Oral Daily PRN [name], MD   17 g at 06/07/23 1847   polyethylene glycol (MIRALAX / GLYCOLAX) packet 17 g  17 g Oral Daily [name], MD   17 g at 06/16/23 0917   sertraline (ZOLOFT) tablet 150 mg  150 mg Oral Daily Massengill, Nathan, MD   150 mg at 06/22/23 1041   SUMAtriptan (IMITREX) tablet 25 mg  25 mg Oral BID PRN [name], NP   25 mg at 05/07/23 2112   traZODone (DESYREL) tablet 50 mg  50 mg Oral QHS [name], NP   50 mg at 06/21/23 2016   Vitamin D (Ergocalciferol) (DRISDOL) 1.25 MG (50000 UNIT) capsule 50,000 Units  50,000 Units Oral Q7 days [name], NP   50,000 Units at 06/15/23 1333    Lab Results:  No results found for this or any previous visit (from the past 48 hours).  Blood Alcohol level:  Lab Results  Component Value Date   ETH <10 12/19/2022   ETH <10 11/21/2019   Metabolic Disorder Labs: Lab Results  Component Value Date   HGBA1C 5.6 06/16/2023   MPG 114.02 06/16/2023   MPG 79.58 12/21/2022   No results found for: "PROLACTIN" Lab Results  Component Value Date   CHOL 256 (H) 06/16/2023   TRIG 224 (H) 06/16/2023   HDL 83 06/16/2023   CHOLHDL 3.1 06/16/2023   VLDL 45 (H) 06/16/2023   LDLCALC 128 (H) 06/16/2023   LDLCALC 149 (H) 12/21/2022    Physical Findings: AIMS: Facial and Oral Movements Muscles of Facial Expression: None Lips and Perioral Area: None Jaw: None Tongue: None,Extremity Movements Upper (arms, wrists, hands,  fingers): None Lower (legs, knees, ankles, toes): None, Trunk Movements Neck, shoulders, hips: None, Global Judgements Severity of abnormal movements overall : None Incapacitation due to abnormal movements: None Patient's awareness of abnormal movements: No Awareness, Dental Status Current problems with teeth and/or dentures?: No Does patient usually wear dentures?: No  CIWA:    COWS:     Musculoskeletal: Strength & Muscle Tone: within normal limits Gait & Station: normal Patient leans: N/A  Psychiatric Specialty Exam:  Presentation  General Appearance:  Casual; Fairly Groomed  Eye Contact: Fair  Speech: Clear and Coherent; Normal Rate  Speech Volume: Normal  Handedness: Right  Mood and Affect  Mood: -- (Saddened because she is unsure of her pending discharge.)  Affect: Congruent  Thought Process  Thought Processes: Coherent; Goal Directed  Descriptions of Associations:Intact  Orientation:Full (Time, Place and Person)  Thought Content:Logical  History of Schizophrenia/Schizoaffective disorder:Yes  Duration of Psychotic Symptoms:Greater than six months  Hallucinations:Hallucinations: Auditory Description of Auditory Hallucinations: "The voice told me that I may not get discharged today".   Ideas of Reference:None  Suicidal Thoughts:Suicidal Thoughts: No SI Active Intent and/or Plan: Without Intent; Without Plan; Without Means to Carry Out; Without Access to Means SI Passive Intent and/or Plan: Without Intent; Without Plan; Without Means to Carry Out; Without Access to Means   Homicidal Thoughts:Homicidal Thoughts: No   Sensorium  Memory: Immediate Good; Recent Good; Remote Fair  Judgment: Fair  Insight: Good  Executive Functions  Concentration: Fair  Attention Span: Fair  Recall: Good  Fund of Knowledge: Fair  Language: Good  Psychomotor Activity  Psychomotor Activity:Psychomotor Activity: Normal   Assets   Assets: Communication Skills; Desire for Improvement; Financial Resources/Insurance; Housing; Resilience; Social Support  Sleep  Sleep:Sleep: Good Number of Hours of Sleep: 8   Physical Exam: Physical Exam Vitals and nursing  note reviewed.  Constitutional:      Appearance: Normal appearance.  HENT:     Head: Normocephalic and atraumatic.     Mouth/Throat:     Mouth: Mucous membranes are moist.     Pharynx: Oropharynx is clear.  Eyes:     Extraocular Movements: Extraocular movements intact.  Cardiovascular:     Rate and Rhythm: Normal rate.     Pulses: Normal pulses.  Pulmonary:     Effort: Pulmonary effort is normal.  Abdominal:     Comments: Deferred  Genitourinary:    Comments: Deferred Musculoskeletal:        General: Normal range of motion.     Cervical back: Normal range of motion.  Skin:    General: Skin is warm.  Neurological:     General: No focal deficit present.     Mental Status: She is alert.  Psychiatric:        Mood and Affect: Mood normal.        Behavior: Behavior normal.    Review of Systems  Constitutional:  Negative for chills and fever.  HENT:  Negative for sore throat.   Eyes:  Negative for blurred vision.  Respiratory:  Negative for cough, sputum production, shortness of breath and wheezing.   Cardiovascular:  Negative for chest pain and palpitations.  Gastrointestinal:  Negative for constipation, diarrhea, heartburn, nausea and vomiting.  Genitourinary:  Negative for dysuria, frequency and urgency.  Musculoskeletal:  Negative for myalgias.  Skin:  Negative for itching and rash.  Neurological:  Negative for headaches.  Endo/Heme/Allergies:        See allergy listing  Psychiatric/Behavioral:  Negative for hallucinations and suicidal ideas.    Blood pressure 101/66, pulse 76, temperature 98.1 F (36.7 C), temperature source Oral, resp. rate 12, height 4\' 11"  (1.499 m), weight 63 kg, SpO2 97%. Body mass index is 28.05 kg/m.  Treatment  Plan Summary: Daily contact with patient to assess and evaluate symptoms and progress in treatment and Medication management   ASSESSMENT: Cassara D Chakrabarti is a 47 y.o. female  with a past psychiatric history of schizoaffective disorder, bipolar type, multiple prior suicide attempts, cognitive delay on disability, and a history of medication nonadherence.  Admitted to [facility] from [facility] with complaints of worsening suicidal ideations with plan to stab herself.   Diagnoses / Active Problems: Schizoaffective disorder bipolar type   PLAN: Safety and Monitoring:             --  INVOLUNTARY admission to inpatient psychiatric unit for safety, stabilization and treatment             -- Daily contact with patient to assess and evaluate symptoms and progress in treatment             -- Patient's case to be discussed in multi-disciplinary team meeting             -- Observation Level : q15 minute checks             -- Vital signs:  q12 hours             -- Precautions: suicide, elopement, and assault   2. Treatment plan. -Trazodone 50 mg nightly for insomnia - Melatonin 5 mg nightly for insomnia - Zoloft 150 mg daily for depression - Paliperidone 6 mg daily for psychosis - BuSpar 15 mg twice daily for anxiety.  -- The risks/benefits/side-effects/alternatives to this medication were discussed in detail with the patient and time was  given for questions. The patient consents to medication trial.              -- Metabolic profile and EKG monitoring obtained while on an atypical antipsychotic.  Other PRNS:  acetaminophen, alum & mag hydroxide-simeth, diphenhydrAMINE **OR** diphenhydrAMINE, haloperidol **OR** haloperidol lactate, hydrOXYzine, LORazepam **OR** LORazepam, magnesium hydroxide, nicotine polacrilex, polyethylene glycol, SUMAtriptan               3. Medical Issues Being Addressed:  Other medications include: scheduled Colace, hydrocortisone cream, Protonix 40 mg for GERD   #  Vitamin D deficiency Vitamin D capsule 50,000 units q. 7 days   4. Discharge Planning:              -- Social work and case management to assist with discharge planning and identification of hospital follow-up needs prior to discharge             -- Estimated Discharge Date:               -- Apparently patient now has a legal guardian. Placement is in progress. The patient may not have the option to go to the mother's house unless there is 24/7 supervision.  06/13/2023: LCSW, patient, and Guardian Ad Litem had a meeting yesterday regarding patient discharge 06/12/2023.  Please see social worker's notes. May have placement for next week at a group home.               -- Discharge Concerns: Need to establish a safety plan; Medication compliance and effectiveness             -- Discharge Goals: Return home with outpatient referrals for mental health follow-up including medication management/psychotherapy    I certify that inpatient services furnished can reasonably be expected to improve the patient's condition.    Golda Acre, MD 06/22/2023, 11:30 AM

## 2023-06-22 NOTE — Progress Notes (Signed)
Pt did not attend goals group. 

## 2023-06-22 NOTE — [facility] Group Notes (Signed)
Psychoeducational Group Note  Date:  06/22/2023 Time:  2015  Group Topic/Focus:  Wrap up group  Participation Level: Did Not Attend  Participation Quality:  Not Applicable  Affect:  Not Applicable  Cognitive:  Not Applicable  Insight:  Not Applicable  Engagement in Group: Not Applicable  Additional Comments:  Did not attend.   [name] 06/22/2023, 8:58 PM

## 2023-06-22 NOTE — Progress Notes (Signed)
   06/22/23 0556  15 Minute Checks  Location Bedroom  Visual Appearance Calm  Behavior Sleeping  Sleep (Behavioral Health Patients Only)  Calculate sleep? (Click Yes once per 24 hr at 0600 safety check) Yes  Documented sleep last 24 hours 9.75

## 2023-06-22 NOTE — [facility] Group Notes (Signed)
[facility] Group Notes:  (Nursing)  Date:  06/22/2023  Time:  1400  Type of Therapy:  Psychoeducational Skills  Participation Level:  Did Not Attend   Shela Nevin 06/22/2023, 3:45 PM

## 2023-06-22 NOTE — Plan of Care (Signed)
  Problem: Education: Goal: Knowledge of [facility] Education information/materials will improve Outcome: Progressing Goal: Emotional status will improve Outcome: Progressing Goal: Mental status will improve Outcome: Progressing Goal: Verbalization of understanding the information provided will improve Outcome: Progressing   Problem: Activity: Goal: Interest or engagement in activities will improve Outcome: Progressing Goal: Sleeping patterns will improve Outcome: Progressing   Problem: Coping: Goal: Ability to verbalize frustrations and anger appropriately will improve Outcome: Progressing Goal: Ability to demonstrate self-control will improve Outcome: Progressing   Problem: Health Behavior/Discharge Planning: Goal: Identification of resources available to assist in meeting health care needs will improve Outcome: Progressing Goal: Compliance with treatment plan for underlying cause of condition will improve Outcome: Progressing   Problem: Physical Regulation: Goal: Ability to maintain clinical measurements within normal limits will improve Outcome: Progressing   Problem: Safety: Goal: Periods of time without injury will increase Outcome: Progressing   Problem: Education: Goal: Ability to make informed decisions regarding treatment will improve Outcome: Progressing   Problem: Coping: Goal: Coping ability will improve Outcome: Progressing   Problem: Health Behavior/Discharge Planning: Goal: Identification of resources available to assist in meeting health care needs will improve Outcome: Progressing   Problem: Medication: Goal: Compliance with prescribed medication regimen will improve Outcome: Progressing   Problem: Self-Concept: Goal: Ability to disclose and discuss suicidal ideas will improve Outcome: Progressing Goal: Will verbalize positive feelings about self Outcome: Progressing Note: Patient is on track. Patient will maintain adherence

## 2023-06-22 NOTE — Progress Notes (Signed)
   06/22/23 1200  Psych Admission Type (Psych Patients Only)  Admission Status Involuntary  Psychosocial Assessment  Patient Complaints Worthlessness  Eye Contact Fair  Facial Expression Sad  Affect Appropriate to circumstance  Speech Soft  Interaction Assertive  Motor Activity Tremors  Appearance/Hygiene Unremarkable  Behavior Characteristics Cooperative  Mood Pleasant  Thought Process  Coherency WDL  Content WDL  Delusions None reported or observed  Perception WDL  Hallucination None reported or observed  Judgment Impaired  Confusion None  Danger to Self  Current suicidal ideation? Denies  Danger to Others  Danger to Others None reported or observed

## 2023-06-22 NOTE — [facility] Group Notes (Signed)
Pt did not attend in the group activity

## 2023-06-23 NOTE — [facility] Group Notes (Signed)
Type of Therapy and Topic:  Group Therapy: Mindful vs Mind Full  Participation Level: Did Not Attend   Description of Group:   In this group, patients shared and discussed the importance of acknowledging the elements in their lives for when they are mindful and mind full and how this can positively impact their mood.  The group discussed how being mindful of their surroundings can benefit the decisions they make and how it can affect others. The group also discussed how being mind full can impact the way decisions are ineffective. An exercise was done as a group in which a list was made of mindfulness items in order to encourage participants to consider other potential positives in their lives.  Therapeutic Goals: Patients will identify one or more item for which they are mindfulness living through their day and who it can affect:  people, experiences, things, places, skills, and other. Patients will discuss how it is possible to apply a mindful mind to an event in their life. Patients will explore other possible items of a mindful vs mind full that they could remember.    Summary of Patient Progress:  NA.  Therapeutic Modalities:   Solution-Focused Therapy

## 2023-06-23 NOTE — Progress Notes (Signed)
[facility] MD Progress Note  06/23/2023 9:57 AM Yolanda [name]  MRN:  [id number]  Reason for admission:  This is the first psychiatric admission in this [facility] in 12 years for this AA female with an extensive hx of mental illnesses & probable polysubstance use disorders. She is admitted to the [facility] from the [facility] hospital with complain of worsening suicidal ideations with plan to stab herself. Per chart review, patient apparently reported at the ED that she has been depressed for a while & has not been taking her mental health medications. After medical evaluation.clearance, she was transferred to the [facility] for further psychiatric evaluation/treatments. During this evaluation, [name] presents irritated, labile, angry & tearful. She is not forth-coming with the information needed to help manage her care.  She denies delusional thinking or paranoia.  Daily notes:  [name] was seen in her room during rounds.  She reported that her mood was "ok" today.  She denies any new psychiatric or medical complaints.  She reports that her appetite is good.  She reports that focus and concentration are adequate.  She denies issues with energy.  She reports adequate sleep.  She denies any medication side effects.  She denies suicidal ideations, homicidal ideations, auditory hallucinations, visual hallucinations, or delusions.  The patient has not capable of living independently due to intellectual disability in conjunction with chronic mental health issues.  She is pending placement.   Principal Problem: Schizoaffective disorder, depressive type (HCC) Diagnosis: Principal Problem:   Schizoaffective disorder, depressive type (HCC) Active Problems:   GERD (gastroesophageal reflux disease)   Intellectual disability   Tobacco use disorder  Total Time spent with patient: 35 minutes  Past Psychiatric History: Previous Psychotropic Medications: Mirtazapine, Invega sustenna, Gabapentin, Cogentin, Buspar, Naltrexone,  Abilify.   Past Medical History:  Past Medical History:  Diagnosis Date   Bipolar affect, depressed (HCC)    Constipation 08/17/2022   Depression    Falls 07/21/2022   Fracture of femoral neck, right, closed (HCC) 01/17/2022   Herpes simplex 08/22/2017   Open fracture dislocation of right elbow joint 01/17/2022    Past Surgical History:  Procedure Laterality Date   NO PAST SURGERIES     SALPINGECTOMY     Family History: History reviewed. No pertinent family history. Family Psychiatric  History:  Patient reports that everyone in her family has bipolar disorder.  Social History:  Social History   Substance and Sexual Activity  Alcohol Use Yes     Social History   Substance and Sexual Activity  Drug Use Yes   Types: Cocaine, Marijuana    Social History   Socioeconomic History   Marital status: Single    Spouse name: Not on file   Number of children: Not on file   Years of education: Not on file   Highest education level: Not on file  Occupational History   Not on file  Tobacco Use   Smoking status: Every Day   Smokeless tobacco: Not on file  Substance and Sexual Activity   Alcohol use: Yes   Drug use: Yes    Types: Cocaine, Marijuana   Sexual activity: Yes  Other Topics Concern   Not on file  Social History Narrative   Not on file   Social Drivers of Health   Financial Resource Strain: Low Risk  (09/12/2022)   Received from [facility], [facility]   Overall Financial Resource Strain (CARDIA)    Difficulty of Paying Living Expenses: Not hard at all  Food  Insecurity: Patient Declined (12/20/2022)   Hunger Vital Sign    Worried About Running Out of Food in the Last Year: Patient declined    Ran Out of Food in the Last Year: Patient declined  Transportation Needs: No Transportation Needs (12/20/2022)   PRAPARE - Administrator, Civil Service (Medical): No    Lack of Transportation (Non-Medical): No  Physical Activity: Not on file  Stress: No  Stress Concern Present (07/17/2022)   Received from [facility], [facility] Occupational [facility] - Occupational Stress Questionnaire    Feeling of Stress : Not at all  Social Connections: Unknown (07/16/2022)   Received from [facility], [facility]   Social Network    Social Network: Not on file   Additional Social History:    Sleep: Good  Appetite:  Fair  Current Medications: Current Facility-Administered Medications  Medication Dose Route Frequency Provider Last Rate Last Admin   acetaminophen (TYLENOL) tablet 650 mg  650 mg Oral Q6H PRN [name], NP   650 mg at 06/22/23 1850   alum & mag hydroxide-simeth (MAALOX/MYLANTA) 200-200-20 MG/5ML suspension 30 mL  30 mL Oral Q4H PRN Onuoha, Chinwendu V, NP   30 mL at 05/31/23 0801   busPIRone (BUSPAR) tablet 15 mg  15 mg Oral BID [name], NP   15 mg at 06/23/23 1610   cyanocobalamin (VITAMIN B12) injection 1,000 mcg  1,000 mcg Intramuscular Q30 days Sarita Bottom, MD   1,000 mcg at 06/02/23 1129   diphenhydrAMINE (BENADRYL) capsule 50 mg  50 mg Oral TID PRN [name], NP   50 mg at 06/03/23 1110   Or   diphenhydrAMINE (BENADRYL) injection 50 mg  50 mg Intramuscular TID PRN [name], NP       docusate sodium (COLACE) capsule 100 mg  100 mg Oral Daily Nkwenti, Tyler Aas, NP   100 mg at 06/23/23 0819   feeding supplement (ENSURE ENLIVE / ENSURE PLUS) liquid 237 mL  237 mL Oral TID BM Nkwenti, Doris, NP   237 mL at 06/23/23 0917   haloperidol (HALDOL) tablet 5 mg  5 mg Oral TID PRN [name], NP   5 mg at 06/03/23 1110   Or   haloperidol lactate (HALDOL) injection 5 mg  5 mg Intramuscular TID PRN [name], NP       hydrocortisone cream 1 %   Topical BID Cecilie Lowers, FNP   Given at 06/23/23 9604   hydrOXYzine (ATARAX) tablet 25 mg  25 mg Oral TID PRN [name], DO   25 mg at 05/27/23 1146   LORazepam (ATIVAN) tablet 2 mg  2 mg Oral TID PRN [name], NP   2 mg at 06/03/23 1110    Or   LORazepam (ATIVAN) injection 2 mg  2 mg Intramuscular TID PRN [name], NP       magnesium hydroxide (MILK OF MAGNESIA) suspension 30 mL  30 mL Oral Daily PRN [name], NP   30 mL at 06/10/23 2121   melatonin tablet 5 mg  5 mg Oral QHS Nkwenti, Doris, NP   5 mg at 06/22/23 2112   nicotine polacrilex (NICORETTE) gum 2 mg  2 mg Oral PRN [name], MD   2 mg at 06/21/23 0815   paliperidone (INVEGA) 24 hr tablet 6 mg  6 mg Oral Daily [name], NP   6 mg at 06/22/23 2112   pantoprazole (PROTONIX) EC tablet 40 mg  40 mg Oral Daily Nwoko, Nicole [facility]red I, NP   40 mg at 06/23/23 0819   polyethylene glycol (MIRALAX / GLYCOLAX) packet 17 g  17 g Oral Daily PRN [name], MD   17 g at 06/07/23 1847   polyethylene glycol (MIRALAX / GLYCOLAX) packet 17 g  17 g Oral Daily [name], MD   17 g at 06/16/23 0917   sertraline (ZOLOFT) tablet 150 mg  150 mg Oral Daily Massengill, Harrold Donath, MD   150 mg at 06/23/23 0820   SUMAtriptan (IMITREX) tablet 25 mg  25 mg Oral BID PRN [name], NP   25 mg at 05/07/23 2112   traZODone (DESYREL) tablet 50 mg  50 mg Oral QHS [name], NP   50 mg at 06/22/23 2112   Vitamin D (Ergocalciferol) (DRISDOL) 1.25 MG (50000 UNIT) capsule 50,000 Units  50,000 Units Oral Q7 days [name], NP   50,000 Units at 06/22/23 1524    Lab Results:  No results found for this or any previous visit (from the past 48 hours).  Blood Alcohol level:  Lab Results  Component Value Date   ETH <10 12/19/2022   ETH <10 11/21/2019   Metabolic Disorder Labs: Lab Results  Component Value Date   HGBA1C 5.6 06/16/2023   MPG 114.02 06/16/2023   MPG 79.58 12/21/2022   No results found for: "PROLACTIN" Lab Results  Component Value Date   CHOL 256 (H) 06/16/2023   TRIG 224 (H) 06/16/2023   HDL 83 06/16/2023   CHOLHDL 3.1 06/16/2023   VLDL 45 (H) 06/16/2023   LDLCALC 128 (H) 06/16/2023   LDLCALC 149 (H) 12/21/2022    Physical Findings: AIMS:  Facial and Oral Movements Muscles of Facial Expression: None Lips and Perioral Area: None Jaw: None Tongue: None,Extremity Movements Upper (arms, wrists, hands, fingers): None Lower (legs, knees, ankles, toes): None, Trunk Movements Neck, shoulders, hips: None, Global Judgements Severity of abnormal movements overall : None Incapacitation due to abnormal movements: None Patient's awareness of abnormal movements: No Awareness, Dental Status Current problems with teeth and/or dentures?: No Does patient usually wear dentures?: No  CIWA:    COWS:     Musculoskeletal: Strength & Muscle Tone: within normal limits Gait & Station: normal Patient leans: N/A  Psychiatric Specialty Exam:  Presentation  General Appearance:  Disheveled  Eye Contact: Fair  Speech: Clear and Coherent  Speech Volume: Decreased  Handedness: Right  Mood and Affect  Mood: Dysphoric  Affect: Congruent  Thought Process  Thought Processes: Coherent  Descriptions of Associations:Intact  Orientation:Full (Time, Place and Person)  Thought Content:Logical  History of Schizophrenia/Schizoaffective disorder:Yes  Duration of Psychotic Symptoms:Greater than six months  Hallucinations:Hallucinations: None    Ideas of Reference:None  Suicidal Thoughts:Suicidal Thoughts: No    Homicidal Thoughts:Homicidal Thoughts: No    Sensorium  Memory: Immediate Fair; Recent Fair  Judgment: Other (comment) (Limited)  Insight: Other (comment) (Limited)  Executive Functions  Concentration: Fair  Attention Span: Fair  Recall: Dudley Major of Knowledge: Fair  Language: Fair  Psychomotor Activity  Psychomotor Activity:Psychomotor Activity: Normal    Assets  Assets: Manufacturing systems engineer; Desire for Improvement  Sleep  Sleep:Sleep: Good    Physical Exam: Physical Exam Vitals and nursing note reviewed.  Constitutional:      Appearance: Normal appearance.  HENT:      Head: Normocephalic and atraumatic.     Mouth/Throat:     Mouth: Mucous membranes are moist.     Pharynx: Oropharynx is clear.  Eyes:     Extraocular Movements: Extraocular movements intact.  Cardiovascular:     Rate and Rhythm: Normal rate.     Pulses: Normal pulses.  Pulmonary:     Effort: Pulmonary effort is normal.  Abdominal:     Comments: Deferred  Genitourinary:    Comments: Deferred Musculoskeletal:        General: Normal range of motion.     Cervical back: Normal range of motion.  Skin:    General: Skin is warm.  Neurological:     General: No focal deficit present.     Mental Status: She is alert.  Psychiatric:        Mood and Affect: Mood normal.        Behavior: Behavior normal.    Review of Systems  Constitutional:  Negative for chills and fever.  HENT:  Negative for sore throat.   Eyes:  Negative for blurred vision.  Respiratory:  Negative for cough, sputum production, shortness of breath and wheezing.   Cardiovascular:  Negative for chest pain and palpitations.  Gastrointestinal:  Negative for constipation, diarrhea, heartburn, nausea and vomiting.  Genitourinary:  Negative for dysuria, frequency and urgency.  Musculoskeletal:  Negative for myalgias.  Skin:  Negative for itching and rash.  Neurological:  Negative for headaches.  Endo/Heme/Allergies:        See allergy listing  Psychiatric/Behavioral:  Negative for hallucinations and suicidal ideas.    Blood pressure 101/66, pulse 76, temperature 98.1 F (36.7 C), temperature source Oral, resp. rate 12, height 4\' 11"  (1.499 m), weight 63 kg, SpO2 97%. Body mass index is 28.05 kg/m.  Treatment Plan Summary: Daily contact with patient to assess and evaluate symptoms and progress in treatment and Medication management   ASSESSMENT: Chrisoula D Hell is a 47 y.o. female  with a past psychiatric history of schizoaffective disorder, bipolar type, multiple prior suicide attempts, cognitive delay on disability,  and a history of medication nonadherence.  Admitted to [facility] from [facility] with complaints of worsening suicidal ideations with plan to stab herself.   Diagnoses / Active Problems: Schizoaffective disorder bipolar type   PLAN: Safety and Monitoring:             --  INVOLUNTARY admission to inpatient psychiatric unit for safety, stabilization and treatment             -- Daily contact with patient to assess and evaluate symptoms and progress in treatment             -- Patient's case to be discussed in multi-disciplinary team meeting             -- Observation Level : q15 minute checks             -- Vital signs:  q12 hours             -- Precautions: suicide, elopement, and assault   2. Treatment plan. -Trazodone 50 mg nightly for insomnia - Melatonin 5 mg nightly for insomnia - Zoloft 150 mg daily for depression - Paliperidone 6 mg daily for psychosis - BuSpar 15 mg twice daily for anxiety.  -- The risks/benefits/side-effects/alternatives to this medication were discussed in detail with the patient and time was given for questions. The patient consents to medication trial.              -- Metabolic profile and EKG monitoring obtained while on an atypical antipsychotic.  Other PRNS:  acetaminophen, alum & mag hydroxide-simeth, diphenhydrAMINE **OR**  diphenhydrAMINE, haloperidol **OR** haloperidol lactate, hydrOXYzine, LORazepam **OR** LORazepam, magnesium hydroxide, nicotine polacrilex, polyethylene glycol, SUMAtriptan               3. Medical Issues Being Addressed:  Other medications include: scheduled Colace, hydrocortisone cream, Protonix 40 mg for GERD   # Vitamin D deficiency Vitamin D capsule 50,000 units q. 7 days   4. Discharge Planning:              -- Social work and case management to assist with discharge planning and identification of hospital follow-up needs prior to discharge             -- Estimated Discharge Date:               -- Apparently patient now  has a legal guardian. Placement is in progress. The patient may not have the option to go to the mother's house unless there is 24/7 supervision.  06/13/2023: LCSW, patient, and Guardian Ad Litem had a meeting yesterday regarding patient discharge 06/12/2023.  Please see social worker's notes. May have placement for next week at a group home.               -- Discharge Concerns: Need to establish a safety plan; Medication compliance and effectiveness             -- Discharge Goals: Return home with outpatient referrals for mental health follow-up including medication management/psychotherapy    I certify that inpatient services furnished can reasonably be expected to improve the patient's condition.    Golda Acre, MD 06/23/2023, 9:57 AM

## 2023-06-23 NOTE — Progress Notes (Signed)
   06/23/23 0533  15 Minute Checks  Location Hallway  Visual Appearance Calm  Behavior Sleeping  Sleep (Behavioral Health Patients Only)  Calculate sleep? (Click Yes once per 24 hr at 0600 safety check) Yes  Documented sleep last 24 hours 7.75

## 2023-06-23 NOTE — Plan of Care (Signed)
°  Problem: Education: °Goal: Ability to make informed decisions regarding treatment will improve °Outcome: Progressing °  °Problem: Coping: °Goal: Coping ability will improve °Outcome: Progressing °  °

## 2023-06-23 NOTE — [facility] Group Notes (Signed)
[facility] Group Notes:  (Nursing)  Date:  06/23/2023  Time:  1400  Type of Therapy:  Psychoeducational Skills  Participation Level:  Active  Participation Quality:  Appropriate and Attentive  Affect:  Appropriate  Cognitive:  Alert and Appropriate  Insight:  Appropriate  Engagement in Group:  Engaged  Modes of Intervention:  Activity, Discussion, Exploration, Rapport Building, Socialization, and Support  Summary of Progress/Problems: Group practiced mindfulness/ made intuitive collage  Shela Nevin 06/23/2023, 4:34 PM

## 2023-06-23 NOTE — [facility] Group Notes (Signed)
Psychoeducational Group Note  Date:  06/23/2023 Time:  2000  Group Topic/Focus:  Wrap up group  Participation Level: Did Not Attend  Participation Quality:  Not Applicable  Affect:  Not Applicable  Cognitive:  Not Applicable  Insight:  Not Applicable  Engagement in Group: Not Applicable  Additional Comments:  Did not attend.   [name] 06/23/2023, 8:53 PM

## 2023-06-23 NOTE — Progress Notes (Signed)
   06/23/23 2158  Psych Admission Type (Psych Patients Only)  Admission Status Involuntary  Psychosocial Assessment  Patient Complaints Anxiety  Eye Contact Fair  Facial Expression Animated  Affect Appropriate to circumstance  Speech Soft  Interaction Assertive  Motor Activity Tremors  Appearance/Hygiene Disheveled  Behavior Characteristics Appropriate to situation  Mood Pleasant  Thought Process  Coherency WDL  Content WDL  Delusions None reported or observed  Perception WDL  Hallucination None reported or observed  Judgment Limited  Confusion None  Danger to Self  Current suicidal ideation? Denies  Self-Injurious Behavior No self-injurious ideation or behavior indicators observed or expressed   Agreement Not to Harm Self Yes  Description of Agreement verbal  Danger to Others  Danger to Others None reported or observed

## 2023-06-23 NOTE — Plan of Care (Signed)
  Problem: Coping: Goal: Ability to verbalize frustrations and anger appropriately will improve Outcome: Progressing Goal: Ability to demonstrate self-control will improve Outcome: Progressing   

## 2023-06-23 NOTE — [facility] Group Notes (Signed)
Pt did not attend music therapy

## 2023-06-23 NOTE — Progress Notes (Signed)
   06/23/23 1000  Psych Admission Type (Psych Patients Only)  Admission Status Involuntary  Psychosocial Assessment  Patient Complaints None  Eye Contact Fair  Facial Expression Sad  Affect Appropriate to circumstance  Speech Soft  Interaction Assertive  Motor Activity Tremors;Unsteady  Appearance/Hygiene Disheveled  Behavior Characteristics Cooperative  Mood Pleasant  Thought Process  Coherency WDL  Content WDL  Delusions None reported or observed  Perception WDL  Hallucination None reported or observed  Judgment Impaired  Confusion None  Danger to Self  Current suicidal ideation? Denies  Danger to Others  Danger to Others None reported or observed

## 2023-06-23 NOTE — BHH Group Notes (Signed)
Pt did not attend goals group. 

## 2023-06-24 ENCOUNTER — Encounter (HOSPITAL_COMMUNITY): Payer: Self-pay

## 2023-06-24 DIAGNOSIS — F251 Schizoaffective disorder, depressive type: Secondary | ICD-10-CM | POA: Diagnosis not present

## 2023-06-24 NOTE — Progress Notes (Signed)
   06/24/23 2045  Psych Admission Type (Psych Patients Only)  Admission Status Involuntary  Psychosocial Assessment  Patient Complaints Depression  Eye Contact Fair  Facial Expression Flat;Sad  Affect Appropriate to circumstance  Speech Soft  Interaction Assertive  Motor Activity Tremors  Appearance/Hygiene Disheveled;Body odor  Behavior Characteristics Cooperative  Mood Depressed;Anxious  Aggressive Behavior  Effect No apparent injury  Thought Process  Coherency WDL  Content WDL  Delusions WDL  Perception WDL  Hallucination None reported or observed  Judgment Impaired  Confusion WDL  Danger to Self  Current suicidal ideation? Denies

## 2023-06-24 NOTE — Progress Notes (Signed)
[facility] MD Progress Note  06/24/2023 4:26 PM Yolanda [name]  MRN:  [id number]  Reason for admission:  This is the first psychiatric admission in this [facility] in 12 years for this AA female with an extensive hx of mental illnesses & probable polysubstance use disorders. She is admitted to the [facility] from the [facility] hospital with complain of worsening suicidal ideations with plan to stab herself. Per chart review, patient apparently reported at the ED that she has been depressed for a while & has not been taking her mental health medications. After medical evaluation.clearance, she was transferred to the [facility] for further psychiatric evaluation/treatments. During this evaluation, Yolanda [name] presents irritated, labile, angry & tearful. She is not forth-coming with the information needed to help manage her care.  She denies delusional thinking or paranoia.  Today's assessment notes:  Patient is seen and examined in her room during assessment today.  She reports her mood is less depressed and rated as #4/10, with 10 being high severity.  She reports good appetite, and sleeping over 8.25 hours last night and being restful.  Report adequate energy and denies any medication adverse reaction.  Reports her goal today is to be happy and focused on her GED studies.  She denies SI, HI, or AVH.  Denies delusional thinking or paranoia.  Patient continues to be incapable of independent living due to intellectual disability in addition to chronic mental health issues.  Placement in other facilities is pending at the present time.  No changes in her current medication regimen and patient is compliant with treatment regimen.  Principal Problem: Schizoaffective disorder, depressive type (HCC) Diagnosis: Principal Problem:   Schizoaffective disorder, depressive type (HCC) Active Problems:   GERD (gastroesophageal reflux disease)   Intellectual disability   Tobacco use disorder  Total Time spent with patient: 35 minutes  Past  Psychiatric History: Previous Psychotropic Medications: Mirtazapine, Invega sustenna, Gabapentin, Cogentin, Buspar, Naltrexone, Abilify.   Past Medical History:  Past Medical History:  Diagnosis Date   Bipolar affect, depressed (HCC)    Constipation 08/17/2022   Depression    Falls 07/21/2022   Fracture of femoral neck, right, closed (HCC) 01/17/2022   Herpes simplex 08/22/2017   Open fracture dislocation of right elbow joint 01/17/2022    Past Surgical History:  Procedure Laterality Date   NO PAST SURGERIES     SALPINGECTOMY     Family History: History reviewed. No pertinent family history. Family Psychiatric  History:  Patient reports that everyone in her family has bipolar disorder.  Social History:  Social History   Substance and Sexual Activity  Alcohol Use Yes     Social History   Substance and Sexual Activity  Drug Use Yes   Types: Cocaine, Marijuana    Social History   Socioeconomic History   Marital status: Single    Spouse name: Not on file   Number of children: Not on file   Years of education: Not on file   Highest education level: Not on file  Occupational History   Not on file  Tobacco Use   Smoking status: Every Day   Smokeless tobacco: Not on file  Substance and Sexual Activity   Alcohol use: Yes   Drug use: Yes    Types: Cocaine, Marijuana   Sexual activity: Yes  Other Topics Concern   Not on file  Social History Narrative   Not on file   Social Drivers of Health   Financial Resource Strain: Low Risk  (09/12/2022)  Received from [facility], [facility]   Overall Financial Resource Strain (CARDIA)    Difficulty of Paying Living Expenses: Not hard at all  Food Insecurity: Patient Declined (12/20/2022)   Hunger Vital Sign    Worried About Running Out of Food in the Last Year: Patient declined    Ran Out of Food in the Last Year: Patient declined  Transportation Needs: No Transportation Needs (12/20/2022)   PRAPARE - Therapist, art (Medical): No    Lack of Transportation (Non-Medical): No  Physical Activity: Not on file  Stress: No Stress Concern Present (07/17/2022)   Received from [facility], [facility] - Occupational Stress Questionnaire    Feeling of Stress : Not at all  Social Connections: Unknown (07/16/2022)   Received from [facility], [facility]   Social Network    Social Network: Not on file   Additional Social History:    Sleep: Good  Appetite:  Fair  Current Medications: Current Facility-Administered Medications  Medication Dose Route Frequency Provider Last Rate Last Admin   acetaminophen (TYLENOL) tablet 650 mg  650 mg Oral Q6H PRN [name], NP   650 mg at 06/23/23 1525   alum & mag hydroxide-simeth (MAALOX/MYLANTA) 200-200-20 MG/5ML suspension 30 mL  30 mL Oral Q4H PRN Onuoha, Chinwendu V, NP   30 mL at 05/31/23 0801   busPIRone (BUSPAR) tablet 15 mg  15 mg Oral BID [name], NP   15 mg at 06/24/23 0744   cyanocobalamin (VITAMIN B12) injection 1,000 mcg  1,000 mcg Intramuscular Q30 days Sarita Bottom, MD   1,000 mcg at 06/02/23 1129   diphenhydrAMINE (BENADRYL) capsule 50 mg  50 mg Oral TID PRN [name], NP   50 mg at 06/03/23 1110   Or   diphenhydrAMINE (BENADRYL) injection 50 mg  50 mg Intramuscular TID PRN [name], NP       docusate sodium (COLACE) capsule 100 mg  100 mg Oral Daily Nkwenti, Tyler Aas, NP   100 mg at 06/24/23 0744   feeding supplement (ENSURE ENLIVE / ENSURE PLUS) liquid 237 mL  237 mL Oral TID BM Nkwenti, Doris, NP   237 mL at 06/23/23 2102   haloperidol (HALDOL) tablet 5 mg  5 mg Oral TID PRN [name], NP   5 mg at 06/03/23 1110   Or   haloperidol lactate (HALDOL) injection 5 mg  5 mg Intramuscular TID PRN [name], NP       hydrocortisone cream 1 %   Topical BID Cecilie Lowers, FNP   Given at 06/24/23 0744   hydrOXYzine (ATARAX) tablet 25 mg  25 mg Oral TID PRN [name], DO   25 mg at 05/27/23 1146   LORazepam (ATIVAN) tablet 2 mg  2 mg Oral TID PRN [name], NP   2 mg at 06/03/23 1110   Or   LORazepam (ATIVAN) injection 2 mg  2 mg Intramuscular TID PRN [name], NP       magnesium hydroxide (MILK OF MAGNESIA) suspension 30 mL  30 mL Oral Daily PRN [name], NP   30 mL at 06/23/23 1856   melatonin tablet 5 mg  5 mg Oral QHS Nkwenti, Doris, NP   5 mg at 06/23/23 2101   nicotine polacrilex (NICORETTE) gum 2 mg  2 mg Oral PRN [name], MD   2 mg at 06/24/23 1438   paliperidone (INVEGA) 24  hr tablet 6 mg  6 mg Oral Daily Nkwenti, Doris, NP   6 mg at 06/23/23 2101   pantoprazole (PROTONIX) EC tablet 40 mg  40 mg Oral Daily Nwoko, Nicole Kindred I, NP   40 mg at 06/24/23 0744   polyethylene glycol (MIRALAX / GLYCOLAX) packet 17 g  17 g Oral Daily PRN [name], MD   17 g at 06/07/23 1847   polyethylene glycol (MIRALAX / GLYCOLAX) packet 17 g  17 g Oral Daily [name], MD   17 g at 06/24/23 0744   sertraline (ZOLOFT) tablet 150 mg  150 mg Oral Daily Massengill, Harrold Donath, MD   150 mg at 06/24/23 0744   SUMAtriptan (IMITREX) tablet 25 mg  25 mg Oral BID PRN [name], NP   25 mg at 05/07/23 2112   traZODone (DESYREL) tablet 50 mg  50 mg Oral QHS Nkwenti, Tyler Aas, NP   50 mg at 06/23/23 2101   Vitamin D (Ergocalciferol) (DRISDOL) 1.25 MG (50000 UNIT) capsule 50,000 Units  50,000 Units Oral Q7 days [name], NP   50,000 Units at 06/22/23 1524   Lab Results:  No results found for this or any previous visit (from the past 48 hours).  Blood Alcohol level:  Lab Results  Component Value Date   ETH <10 12/19/2022   ETH <10 11/21/2019   Metabolic Disorder Labs: Lab Results  Component Value Date   HGBA1C 5.6 06/16/2023   MPG 114.02 06/16/2023   MPG 79.58 12/21/2022   No results found for: "PROLACTIN" Lab Results  Component Value Date   CHOL 256 (H) 06/16/2023   TRIG 224 (H) 06/16/2023   HDL 83 06/16/2023   CHOLHDL 3.1  06/16/2023   VLDL 45 (H) 06/16/2023   LDLCALC 128 (H) 06/16/2023   LDLCALC 149 (H) 12/21/2022    Physical Findings: AIMS: Facial and Oral Movements Muscles of Facial Expression: None Lips and Perioral Area: None Jaw: None Tongue: None,Extremity Movements Upper (arms, wrists, hands, fingers): None Lower (legs, knees, ankles, toes): None, Trunk Movements Neck, shoulders, hips: None, Global Judgements Severity of abnormal movements overall : None Incapacitation due to abnormal movements: None Patient's awareness of abnormal movements: No Awareness, Dental Status Current problems with teeth and/or dentures?: No Does patient usually wear dentures?: No  CIWA:    COWS:     Musculoskeletal: Strength & Muscle Tone: within normal limits Gait & Station: normal Patient leans: N/A  Psychiatric Specialty Exam:  Presentation  General Appearance:  Disheveled  Eye Contact: Fair  Speech: Clear and Coherent  Speech Volume: Decreased  Handedness: Right  Mood and Affect  Mood: Dysphoric  Affect: Congruent  Thought Process  Thought Processes: Coherent  Descriptions of Associations:Intact  Orientation:Full (Time, Place and Person)  Thought Content:Logical  History of Schizophrenia/Schizoaffective disorder:Yes  Duration of Psychotic Symptoms:Greater than six months  Hallucinations:Hallucinations: None  Ideas of Reference:None  Suicidal Thoughts:Suicidal Thoughts: No  Homicidal Thoughts:Homicidal Thoughts: No  Sensorium  Memory: Immediate Fair; Recent Fair  Judgment: Other (comment) (Limited)  Insight: Other (comment) (Limited)  Executive Functions  Concentration: Fair  Attention Span: Fair  Recall: Dudley Major of Knowledge: Fair  Language: Fair  Psychomotor Activity  Psychomotor Activity:Psychomotor Activity: Normal  Assets  Assets: Manufacturing systems engineer; Desire for Improvement  Sleep  Sleep:Sleep: Good  Physical Exam: Physical  Exam Vitals and nursing note reviewed.  Constitutional:      Appearance: Normal appearance.  HENT:     Head: Normocephalic and atraumatic.     Mouth/Throat:  Mouth: Mucous membranes are moist.     Pharynx: Oropharynx is clear.  Eyes:     Extraocular Movements: Extraocular movements intact.  Cardiovascular:     Rate and Rhythm: Normal rate.     Pulses: Normal pulses.  Pulmonary:     Effort: Pulmonary effort is normal.  Abdominal:     Comments: Deferred  Genitourinary:    Comments: Deferred Musculoskeletal:        General: Normal range of motion.     Cervical back: Normal range of motion.  Skin:    General: Skin is warm.  Neurological:     General: No focal deficit present.     Mental Status: She is alert.  Psychiatric:        Mood and Affect: Mood normal.        Behavior: Behavior normal.    Review of Systems  Constitutional:  Negative for chills and fever.  HENT:  Negative for sore throat.   Eyes:  Negative for blurred vision.  Respiratory:  Negative for cough, sputum production, shortness of breath and wheezing.   Cardiovascular:  Negative for chest pain and palpitations.  Gastrointestinal:  Negative for constipation, diarrhea, heartburn, nausea and vomiting.  Genitourinary:  Negative for dysuria, frequency and urgency.  Musculoskeletal:  Negative for myalgias.  Skin:  Negative for itching and rash.  Neurological:  Negative for headaches.  Endo/Heme/Allergies:        See allergy listing  Psychiatric/Behavioral:  Positive for depression. Negative for hallucinations and suicidal ideas.    Blood pressure 101/66, pulse 76, temperature 98.1 F (36.7 C), temperature source Oral, resp. rate 12, height 4\' 11"  (1.499 m), weight 63 kg, SpO2 97%. Body mass index is 28.05 kg/m.  Treatment Plan Summary: Daily contact with patient to assess and evaluate symptoms and progress in treatment and Medication management   ASSESSMENT: Yolanda [name] is a 47 y.o. female  with  a past psychiatric history of schizoaffective disorder, bipolar type, multiple prior suicide attempts, cognitive delay on disability, and a history of medication nonadherence.  Admitted to [facility] from [facility] with complaints of worsening suicidal ideations with plan to stab herself.   Diagnoses / Active Problems: Schizoaffective disorder bipolar type   PLAN: Safety and Monitoring:             --  INVOLUNTARY admission to inpatient psychiatric unit for safety, stabilization and treatment             -- Daily contact with patient to assess and evaluate symptoms and progress in treatment             -- Patient's case to be discussed in multi-disciplinary team meeting             -- Observation Level : q15 minute checks             -- Vital signs:  q12 hours             -- Precautions: suicide, elopement, and assault   2. Treatment plan. -Trazodone 50 mg nightly for insomnia - Melatonin 5 mg nightly for insomnia - Zoloft 150 mg daily for depression - Paliperidone 6 mg daily for psychosis - BuSpar 15 mg twice daily for anxiety.  -- The risks/benefits/side-effects/alternatives to this medication were discussed in detail with the patient and time was given for questions. The patient consents to medication trial.              -- Metabolic profile and EKG monitoring  obtained while on an atypical antipsychotic.  Other PRNS:  acetaminophen, alum & mag hydroxide-simeth, diphenhydrAMINE **OR** diphenhydrAMINE, haloperidol **OR** haloperidol lactate, hydrOXYzine, LORazepam **OR** LORazepam, magnesium hydroxide, nicotine polacrilex, polyethylene glycol, SUMAtriptan               3. Medical Issues Being Addressed:  Other medications include: scheduled Colace, hydrocortisone cream, Protonix 40 mg for GERD   # Vitamin D deficiency Vitamin D capsule 50,000 units q. 7 days   4. Discharge Planning:              -- Social work and case management to assist with discharge planning and  identification of hospital follow-up needs prior to discharge             -- Estimated Discharge Date:               -- Apparently patient now has a legal guardian. Placement is in progress. The patient may not have the option to go to the mother's house unless there is 24/7 supervision.  06/13/2023: LCSW, patient, and Guardian Ad Litem had a meeting yesterday regarding patient discharge 06/12/2023.  Please see social worker's notes. May have placement for next week at a group home.               -- Discharge Concerns: Need to establish a safety plan; Medication compliance and effectiveness             -- Discharge Goals: Return home with outpatient referrals for mental health follow-up including medication management/psychotherapy    I certify that inpatient services furnished can reasonably be expected to improve the patient's condition.    Cecilie Lowers, FNP 06/24/2023, 4:26 PMPatient ID: Larey Brick, female   DOB: [birth year], 47 y.o.   MRN: [id number]

## 2023-06-24 NOTE — Plan of Care (Signed)
?  Problem: Coping: ?Goal: Coping ability will improve ?Outcome: Progressing ?  ?Problem: Education: ?Goal: Ability to make informed decisions regarding treatment will improve ?Outcome: Progressing ?  ?

## 2023-06-24 NOTE — BH IP Treatment Plan (Signed)
Interdisciplinary Treatment and Diagnostic Plan Update  06/24/2023 Time of Session: 2:25 pm- UPDATE Yolanda [name] MRN: [id number]  Principal Diagnosis: Schizoaffective disorder, depressive type (HCC)  Secondary Diagnoses: Principal Problem:   Schizoaffective disorder, depressive type (HCC) Active Problems:   GERD (gastroesophageal reflux disease)   Intellectual disability   Tobacco use disorder   Current Medications:  Current Facility-Administered Medications  Medication Dose Route Frequency Provider Last Rate Last Admin   acetaminophen (TYLENOL) tablet 650 mg  650 mg Oral Q6H PRN [name], NP   650 mg at 06/23/23 1525   alum & mag hydroxide-simeth (MAALOX/MYLANTA) 200-200-20 MG/5ML suspension 30 mL  30 mL Oral Q4H PRN Onuoha, Chinwendu V, NP   30 mL at 05/31/23 0801   busPIRone (BUSPAR) tablet 15 mg  15 mg Oral BID [name], NP   15 mg at 06/24/23 1809   cyanocobalamin (VITAMIN B12) injection 1,000 mcg  1,000 mcg Intramuscular Q30 days Sarita Bottom, MD   1,000 mcg at 06/02/23 1129   diphenhydrAMINE (BENADRYL) capsule 50 mg  50 mg Oral TID PRN [name], NP   50 mg at 06/03/23 1110   Or   diphenhydrAMINE (BENADRYL) injection 50 mg  50 mg Intramuscular TID PRN [name], NP       docusate sodium (COLACE) capsule 100 mg  100 mg Oral Daily [name], NP   100 mg at 06/24/23 0744   feeding supplement (ENSURE ENLIVE / ENSURE PLUS) liquid 237 mL  237 mL Oral TID BM [name], NP   237 mL at 06/23/23 2102   haloperidol (HALDOL) tablet 5 mg  5 mg Oral TID PRN [name], NP   5 mg at 06/03/23 1110   Or   haloperidol lactate (HALDOL) injection 5 mg  5 mg Intramuscular TID PRN [name], NP       hydrocortisone cream 1 %   Topical BID Cecilie Lowers, FNP   Given at 06/24/23 1809   hydrOXYzine (ATARAX) tablet 25 mg  25 mg Oral TID PRN [name], DO   25 mg at 05/27/23 1146   LORazepam (ATIVAN) tablet 2 mg  2 mg Oral TID PRN [name], NP   2 mg  at 06/03/23 1110   Or   LORazepam (ATIVAN) injection 2 mg  2 mg Intramuscular TID PRN [name], NP       magnesium hydroxide (MILK OF MAGNESIA) suspension 30 mL  30 mL Oral Daily PRN [name], NP   30 mL at 06/23/23 1856   melatonin tablet 5 mg  5 mg Oral QHS Nkwenti, Lester Platas, NP   5 mg at 06/23/23 2101   nicotine polacrilex (NICORETTE) gum 2 mg  2 mg Oral PRN [name], MD   2 mg at 06/24/23 1438   paliperidone (INVEGA) 24 hr tablet 6 mg  6 mg Oral Daily Nkwenti, Tyler Aas, NP   6 mg at 06/23/23 2101   pantoprazole (PROTONIX) EC tablet 40 mg  40 mg Oral Daily Nwoko, Nicole Kindred I, NP   40 mg at 06/24/23 0744   polyethylene glycol (MIRALAX / GLYCOLAX) packet 17 g  17 g Oral Daily PRN [name], MD   17 g at 06/07/23 1847   polyethylene glycol (MIRALAX / GLYCOLAX) packet 17 g  17 g Oral Daily [name], MD   17 g at 06/24/23 0744   sertraline (ZOLOFT) tablet 150 mg  150 mg Oral Daily Massengill, Harrold Donath, MD   150 mg at 06/24/23 [phone]  SUMAtriptan (IMITREX) tablet 25 mg  25 mg Oral BID PRN [name], NP   25 mg at 05/07/23 2112   traZODone (DESYREL) tablet 50 mg  50 mg Oral QHS [name], NP   50 mg at 06/23/23 2101   Vitamin D (Ergocalciferol) (DRISDOL) 1.25 MG (50000 UNIT) capsule 50,000 Units  50,000 Units Oral Q7 days [name], NP   50,000 Units at 06/22/23 1524   PTA Medications: Medications Prior to Admission  Medication Sig Dispense Refill Last Dose/Taking   busPIRone (BUSPAR) 15 MG tablet Take 15 mg by mouth 2 (two) times daily. (Patient not taking: Reported on 12/19/2022)      paliperidone (INVEGA SUSTENNA) 156 MG/ML SUSY injection Inject 156 mg into the muscle once. (Patient not taking: Reported on 12/19/2022)      sertraline (ZOLOFT) 50 MG tablet Take 150 mg by mouth daily. (Patient not taking: Reported on 12/19/2022)      traZODone (DESYREL) 100 MG tablet Take 100 mg by mouth at bedtime as needed for sleep. (Patient not taking: Reported on 11/12/2022)        Patient Stressors: Medication change or noncompliance    Patient Strengths: Forensic psychologist fund of knowledge   Treatment Modalities: Medication Management, Group therapy, Case management,  1 to 1 session with clinician, Psychoeducation, Recreational therapy.   Physician Treatment Plan for Primary Diagnosis: Schizoaffective disorder, depressive type (HCC) Long Term Goal(s): Improvement in symptoms so as ready for discharge   Short Term Goals: Ability to identify changes in lifestyle to reduce recurrence of condition will improve Ability to verbalize feelings will improve  Medication Management: Evaluate patient's response, side effects, and tolerance of medication regimen.  Therapeutic Interventions: 1 to 1 sessions, Unit Group sessions and Medication administration.  Evaluation of Outcomes: Not Progressing  Physician Treatment Plan for Secondary Diagnosis: Principal Problem:   Schizoaffective disorder, depressive type (HCC) Active Problems:   GERD (gastroesophageal reflux disease)   Intellectual disability   Tobacco use disorder  Long Term Goal(s): Improvement in symptoms so as ready for discharge   Short Term Goals: Ability to identify changes in lifestyle to reduce recurrence of condition will improve Ability to verbalize feelings will improve     Medication Management: Evaluate patient's response, side effects, and tolerance of medication regimen.  Therapeutic Interventions: 1 to 1 sessions, Unit Group sessions and Medication administration.  Evaluation of Outcomes: Not Progressing   RN Treatment Plan for Primary Diagnosis: Schizoaffective disorder, depressive type (HCC) Long Term Goal(s): Knowledge of disease and therapeutic regimen to maintain health will improve  Short Term Goals: Ability to remain free from injury will improve  Medication Management: RN will administer medications as ordered by provider, will assess and evaluate patient's  response and provide education to patient for prescribed medication. RN will report any adverse and/or side effects to prescribing provider.  Therapeutic Interventions: 1 on 1 counseling sessions, Psychoeducation, Medication administration, Evaluate responses to treatment, Monitor vital signs and CBGs as ordered, Perform/monitor CIWA, COWS, AIMS and Fall Risk screenings as ordered, Perform wound care treatments as ordered.  Evaluation of Outcomes: Progressing   LCSW Treatment Plan for Primary Diagnosis: Schizoaffective disorder, depressive type (HCC) Long Term Goal(s): Safe transition to appropriate next level of care at discharge, Engage patient in therapeutic group addressing interpersonal concerns.  Short Term Goals: Engage patient in aftercare planning with referrals and resources and Increase ability to appropriately verbalize feelings  Therapeutic Interventions: Assess for all discharge needs, 1 to 1 time with Child psychotherapist,  Explore available resources and support systems, Assess for adequacy in community support network, Educate family and significant other(s) on suicide prevention, Complete Psychosocial Assessment, Interpersonal group therapy.  Evaluation of Outcomes: Progressing   Progress in Treatment: Attending groups: Yes. Participating in groups: Yes. Taking medication as prescribed: Yes. Toleration medication: Yes. Family/Significant other contact made: Yes, individual(s) contacted:  Maline Vossen [phone] Patient understands diagnosis: Yes. Discussing patient identified problems/goals with staff: Yes. Medical problems stabilized or resolved: Yes. Denies suicidal/homicidal ideation: Yes. Issues/concerns per patient self-inventory: No. Other: none reported   New problem(s) identified: none reported   New Short Term/Long Term Goal(s): medication stabilization, elimination of SI thoughts, development of comprehensive mental wellness plan.      Patient Goals:  "  placement"   Discharge Plan or Barriers: Patient recently admitted. CSW will continue to follow and assess for appropriate referrals and possible discharge planning.      Reason for Continuation of Hospitalization: Depression Suicidal ideation   Estimated Length of Stay: Pt still expected to discharge  to group home awaiting  Single Case agreement from [facility].  Last 3 [place] Suicide Severity Risk Score: Flowsheet Row [facility] (Current) [facility] from 12/19/2022 in [facility] Department at [facility] from 11/12/2022 in [facility] Emergency Department at [facility]  C-SSRS RISK CATEGORY High Risk High Risk Moderate Risk       [facility] 2/9 Scores:     No data to display          Scribe for Treatment Team: Kathrynn Humble 06/24/2023 8:01 PM

## 2023-06-24 NOTE — BHH Group Notes (Signed)

## 2023-06-24 NOTE — Discharge Planning (Signed)
NPI number provided by [facility] Supervisor [name] in order for LCSW to verify paperwork has been submitted and is in process. However, [employer] reports he would not be able to release any information due to [facility] not being under contract with their agency. LCSW awaiting update by Group Home regarding next steps.   Fernande Boyden, LCSW Clinical Social Worker [place] Ph: [phone]

## 2023-06-24 NOTE — Progress Notes (Signed)
   06/24/23 0527  15 Minute Checks  Location Bedroom  Visual Appearance Calm  Behavior Sleeping  Sleep (Behavioral Health Patients Only)  Calculate sleep? (Click Yes once per 24 hr at 0600 safety check) Yes  Documented sleep last 24 hours 8.25

## 2023-06-24 NOTE — Group Note (Signed)
Date:  06/24/2023 Time:  9:32 AM  Group Topic/Focus:  Orientation/Goals Group: Pt did not attend group     Rafia Shedden D Nicolai Labonte 06/24/2023, 9:32 AM

## 2023-06-24 NOTE — Plan of Care (Signed)
  Problem: Coping: Goal: Coping ability will improve Outcome: Progressing   

## 2023-06-24 NOTE — Discharge Planning (Signed)
LCSW contacted [facility] Home [facility] Dr. Cheree Ditto to follow up regarding submission of paperwork on Friday. Per Dr. Cheree Ditto, [facility] had to submit an out of network contract with [facility] and they are waiting on approval to move forward with authorization. Per Dr. Cheree Ditto, paper was submitted on Friday. Dr. Cheree Ditto reports he will follow up with [facility] Owner [name] regarding updates and will call back once update can be provided.   LCSW attempted to get in contact with [facility] Provider Support Line to verify that paperwork was submitted, however was informed that Clinician would need a NPI number for [facility] for any information to be released. LCSW contact TOC Supervisor [name] regarding this number and was informed that she will call back to inform once received.   LCSW will attempt to follow up with [employer] once NPI number is received. No other needs to report at this time. LCSW will continue to follow and provide support.   Fernande Boyden, LCSW Clinical Social Worker [place] Ph: [phone]

## 2023-06-24 NOTE — [facility] Group Notes (Signed)
[facility] Group Notes:  (Nursing/MHT/Case Management/Adjunct)  Date:  06/24/2023  Time:  9:17 PM  Type of Therapy:  Psychoeducational Skills  Participation Level:  Did Not Attend  Participation Quality:  Resistant  Affect:  Resistant  Cognitive:  Lacking  Insight:  None  Engagement in Group:  None  Modes of Intervention:  Education  Summary of Progress/Problems: The patient did not attend the evening group.   Hazle Coca S 06/24/2023, 9:17 PM

## 2023-06-24 NOTE — Plan of Care (Signed)
  Problem: Education: Goal: Ability to make informed decisions regarding treatment will improve Outcome: Progressing   Problem: Coping: Goal: Coping ability will improve Outcome: Progressing   Problem: Self-Concept: Goal: Will verbalize positive feelings about self Outcome: Progressing Note:

## 2023-06-24 NOTE — Group Note (Signed)
Recreation Therapy Group Note   Group Topic:Stress Management  Group Date: 06/24/2023 Start Time: 0931 End Time: 0951 Facilitators: Chiana Wamser-McCall, LRT,CTRS Location: 300 Hall Dayroom   Group Topic: Stress Management  Goal Area(s) Addresses:  Patient will identify positive stress management techniques. Patient will identify benefits of using stress management post d/c.  Group Description: Meditation. LRT engaged with patients about the benefits of meditation. LRT then played a meditation for patients that focused on self renewal and building confidence in ones self.    Education:  Stress Management, Discharge Planning.   Education Outcome: Acknowledges Education   Affect/Mood: N/A   Participation Level: Did not attend    Clinical Observations/Individualized Feedback:      Plan: Continue to engage patient in RT group sessions 2-3x/week.   Zani Kyllonen-McCall, LRT,CTRS 06/24/2023 12:54 PM

## 2023-06-24 NOTE — Progress Notes (Signed)
   06/24/23 0900  Psych Admission Type (Psych Patients Only)  Admission Status Involuntary  Psychosocial Assessment  Patient Complaints Anxiety;Depression;Hopelessness  Eye Contact Fair  Facial Expression Flat;Sad  Affect Appropriate to circumstance  Speech Soft  Interaction Assertive  Motor Activity Tremors  Appearance/Hygiene Disheveled;Body odor  Behavior Characteristics Appropriate to situation  Mood Depressed;Anxious  Thought Process  Coherency WDL  Content WDL  Delusions None reported or observed  Perception WDL  Hallucination None reported or observed  Judgment Impaired  Confusion None  Danger to Self  Current suicidal ideation? Denies  Description of Suicide Plan No plan  Self-Injurious Behavior No self-injurious ideation or behavior indicators observed or expressed   Agreement Not to Harm Self Yes  Description of Agreement Verbal  Danger to Others  Danger to Others None reported or observed

## 2023-06-25 DIAGNOSIS — F251 Schizoaffective disorder, depressive type: Secondary | ICD-10-CM | POA: Diagnosis not present

## 2023-06-25 NOTE — Progress Notes (Signed)
[facility] MD Progress Note  06/25/2023 5:52 PM Yolanda [name]  MRN:  [id number]  Reason for admission:  This is the first psychiatric admission in this [facility] in 12 years for this AA female with an extensive hx of mental illnesses & probable polysubstance use disorders. She is admitted to the [facility] from the [facility] hospital with complain of worsening suicidal ideations with plan to stab herself. Per chart review, patient apparently reported at the ED that she has been depressed for a while & has not been taking her mental health medications. After medical evaluation.clearance, she was transferred to the [facility] for further psychiatric evaluation/treatments. During this evaluation, Yolanda [name] presents irritated, labile, angry & tearful. She is not forth-coming with the information needed to help manage her care.  She denies delusional thinking or paranoia.  Today's assessment notes:  On assessment today, patient is seen and examined in her room.  She reports her mood is less depressed and rated as #4/10, with 10 being high severity.  She reports good appetite, and sleeping over 7.25 hours last night and being restful.  She reports being tired earlier in the day and not wanting to get out of bed in the morning.  Later in the evening observed patient attending therapeutic milieu and unit group activities and using the unit for him to make some phone calls. Denies delusional thinking or paranoia.  Patient continues to be incapable of independent living due to intellectual disability in addition to chronic mental health issues.  Placement in other facilities is pending at the present time.  No changes in her current medication regimen and patient is compliant with treatment regimen. She denies SI, HI, or AVH.  No acute discomfort verbalized.  Principal Problem: Schizoaffective disorder, depressive type (HCC) Diagnosis: Principal Problem:   Schizoaffective disorder, depressive type (HCC) Active Problems:   GERD  (gastroesophageal reflux disease)   Intellectual disability   Tobacco use disorder  Total Time spent with patient: 35 minutes  Past Psychiatric History: Previous Psychotropic Medications: Mirtazapine, Invega sustenna, Gabapentin, Cogentin, Buspar, Naltrexone, Abilify.   Past Medical History:  Past Medical History:  Diagnosis Date   Bipolar affect, depressed (HCC)    Constipation 08/17/2022   Depression    Falls 07/21/2022   Fracture of femoral neck, right, closed (HCC) 01/17/2022   Herpes simplex 08/22/2017   Open fracture dislocation of right elbow joint 01/17/2022    Past Surgical History:  Procedure Laterality Date   NO PAST SURGERIES     SALPINGECTOMY     Family History: History reviewed. No pertinent family history. Family Psychiatric  History:  Patient reports that everyone in her family has bipolar disorder.  Social History:  Social History   Substance and Sexual Activity  Alcohol Use Yes     Social History   Substance and Sexual Activity  Drug Use Yes   Types: Cocaine, Marijuana    Social History   Socioeconomic History   Marital status: Single    Spouse name: Not on file   Number of children: Not on file   Years of education: Not on file   Highest education level: Not on file  Occupational History   Not on file  Tobacco Use   Smoking status: Every Day   Smokeless tobacco: Not on file  Substance and Sexual Activity   Alcohol use: Yes   Drug use: Yes    Types: Cocaine, Marijuana   Sexual activity: Yes  Other Topics Concern   Not on file  Social  History Narrative   Not on file   Social Drivers of Health   Financial Resource Strain: Low Risk  (09/12/2022)   Received from [facility], [facility]   Overall Financial Resource Strain (CARDIA)    Difficulty of Paying Living Expenses: Not hard at all  Food Insecurity: Patient Declined (12/20/2022)   Hunger Vital Sign    Worried About Running Out of Food in the Last Year: Patient declined    Ran  Out of Food in the Last Year: Patient declined  Transportation Needs: No Transportation Needs (12/20/2022)   PRAPARE - Administrator, Civil Service (Medical): No    Lack of Transportation (Non-Medical): No  Physical Activity: Not on file  Stress: No Stress Concern Present (07/17/2022)   Received from [facility], [facility] of Occupational Health - Occupational Stress Questionnaire    Feeling of Stress : Not at all  Social Connections: Unknown (07/16/2022)   Received from [facility], [facility]   Social Network    Social Network: Not on file   Additional Social History:    Sleep: Good  Appetite:  Fair  Current Medications: Current Facility-Administered Medications  Medication Dose Route Frequency Provider Last Rate Last Admin   acetaminophen (TYLENOL) tablet 650 mg  650 mg Oral Q6H PRN [name], NP   650 mg at 06/23/23 1525   alum & mag hydroxide-simeth (MAALOX/MYLANTA) 200-200-20 MG/5ML suspension 30 mL  30 mL Oral Q4H PRN Onuoha, Chinwendu V, NP   30 mL at 05/31/23 0801   busPIRone (BUSPAR) tablet 15 mg  15 mg Oral BID [name], NP   15 mg at 06/25/23 1735   cyanocobalamin (VITAMIN B12) injection 1,000 mcg  1,000 mcg Intramuscular Q30 days Sarita Bottom, MD   1,000 mcg at 06/02/23 1129   diphenhydrAMINE (BENADRYL) capsule 50 mg  50 mg Oral TID PRN [name], NP   50 mg at 06/03/23 1110   Or   diphenhydrAMINE (BENADRYL) injection 50 mg  50 mg Intramuscular TID PRN [name], NP       docusate sodium (COLACE) capsule 100 mg  100 mg Oral Daily Nkwenti, Tyler Aas, NP   100 mg at 06/25/23 4098   haloperidol (HALDOL) tablet 5 mg  5 mg Oral TID PRN [name], NP   5 mg at 06/03/23 1110   Or   haloperidol lactate (HALDOL) injection 5 mg  5 mg Intramuscular TID PRN [name], NP       hydrocortisone cream 1 %   Topical BID Cecilie Lowers, FNP   Given at 06/25/23 1735   hydrOXYzine (ATARAX) tablet 25 mg  25 mg Oral TID PRN  [name], DO   25 mg at 05/27/23 1146   LORazepam (ATIVAN) tablet 2 mg  2 mg Oral TID PRN [name], NP   2 mg at 06/03/23 1110   Or   LORazepam (ATIVAN) injection 2 mg  2 mg Intramuscular TID PRN [name], NP       magnesium hydroxide (MILK OF MAGNESIA) suspension 30 mL  30 mL Oral Daily PRN [name], NP   30 mL at 06/23/23 1856   melatonin tablet 5 mg  5 mg Oral QHS Nkwenti, Doris, NP   5 mg at 06/24/23 2100   nicotine polacrilex (NICORETTE) gum 2 mg  2 mg Oral PRN [name], MD   2 mg at 06/25/23 1336   paliperidone (INVEGA) 24 hr tablet 6 mg  6 mg Oral Daily [name], NP   6 mg at 06/24/23 2100   pantoprazole (PROTONIX) EC tablet 40 mg  40 mg Oral Daily Nwoko, Nicole Kindred I, NP   40 mg at 06/25/23 0807   polyethylene glycol (MIRALAX / GLYCOLAX) packet 17 g  17 g Oral Daily PRN [name], MD   17 g at 06/07/23 1847   polyethylene glycol (MIRALAX / GLYCOLAX) packet 17 g  17 g Oral Daily [name], MD   17 g at 06/24/23 0744   sertraline (ZOLOFT) tablet 150 mg  150 mg Oral Daily Massengill, Harrold Donath, MD   150 mg at 06/25/23 1478   SUMAtriptan (IMITREX) tablet 25 mg  25 mg Oral BID PRN [name], NP   25 mg at 05/07/23 2112   traZODone (DESYREL) tablet 50 mg  50 mg Oral QHS Nkwenti, Tyler Aas, NP   50 mg at 06/24/23 2100   Vitamin D (Ergocalciferol) (DRISDOL) 1.25 MG (50000 UNIT) capsule 50,000 Units  50,000 Units Oral Q7 days [name], NP   50,000 Units at 06/22/23 1524   Lab Results:  No results found for this or any previous visit (from the past 48 hours).  Blood Alcohol level:  Lab Results  Component Value Date   ETH <10 12/19/2022   ETH <10 11/21/2019   Metabolic Disorder Labs: Lab Results  Component Value Date   HGBA1C 5.6 06/16/2023   MPG 114.02 06/16/2023   MPG 79.58 12/21/2022   No results found for: "PROLACTIN" Lab Results  Component Value Date   CHOL 256 (H) 06/16/2023   TRIG 224 (H) 06/16/2023   HDL 83 06/16/2023   CHOLHDL  3.1 06/16/2023   VLDL 45 (H) 06/16/2023   LDLCALC 128 (H) 06/16/2023   LDLCALC 149 (H) 12/21/2022    Physical Findings: AIMS: Facial and Oral Movements Muscles of Facial Expression: None Lips and Perioral Area: None Jaw: None Tongue: None,Extremity Movements Upper (arms, wrists, hands, fingers): None Lower (legs, knees, ankles, toes): None, Trunk Movements Neck, shoulders, hips: None, Global Judgements Severity of abnormal movements overall : None Incapacitation due to abnormal movements: None Patient's awareness of abnormal movements: No Awareness, Dental Status Current problems with teeth and/or dentures?: No Does patient usually wear dentures?: No  CIWA:    COWS:     Musculoskeletal: Strength & Muscle Tone: within normal limits Gait & Station: normal Patient leans: N/A  Psychiatric Specialty Exam:  Presentation  General Appearance:  Disheveled  Eye Contact: Fair  Speech: Clear and Coherent; Slow  Speech Volume: Normal  Handedness: Right  Mood and Affect  Mood: Dysphoric  Affect: Congruent  Thought Process  Thought Processes: Coherent  Descriptions of Associations:Intact  Orientation:Full (Time, Place and Person)  Thought Content:Logical  History of Schizophrenia/Schizoaffective disorder:Yes  Duration of Psychotic Symptoms:Greater than six months  Hallucinations:Hallucinations: None Description of Auditory Hallucinations: Denies  Ideas of Reference:None  Suicidal Thoughts:SI Active Intent and/or Plan: Without Intent; Without Plan; Without Means to Carry Out; Without Access to Means SI Passive Intent and/or Plan: Without Intent; Without Plan; Without Means to Carry Out; Without Access to Means  Homicidal Thoughts:Homicidal Thoughts: No  Sensorium  Memory: Immediate Fair; Recent Fair  Judgment: Fair  Insight: -- (Limited)  Executive Functions  Concentration: Fair  Attention Span: Fair  Recall: [employer] of  Knowledge: Fair  Language: Fair  Psychomotor Activity  Psychomotor Activity:Psychomotor Activity: Normal  Assets  Assets: Manufacturing systems engineer; Resilience; Desire for Improvement  Sleep  Sleep:Sleep: Good Number of Hours of  Sleep: 7.25  Physical Exam: Physical Exam Vitals and nursing note reviewed.  Constitutional:      Appearance: Normal appearance.  HENT:     Head: Normocephalic and atraumatic.     Mouth/Throat:     Mouth: Mucous membranes are moist.     Pharynx: Oropharynx is clear.  Eyes:     Extraocular Movements: Extraocular movements intact.  Cardiovascular:     Rate and Rhythm: Normal rate.     Pulses: Normal pulses.  Pulmonary:     Effort: Pulmonary effort is normal.  Abdominal:     Comments: Deferred  Genitourinary:    Comments: Deferred Musculoskeletal:        General: Normal range of motion.     Cervical back: Normal range of motion.  Skin:    General: Skin is warm.  Neurological:     General: No focal deficit present.     Mental Status: She is alert.  Psychiatric:        Mood and Affect: Mood normal.        Behavior: Behavior normal.    Review of Systems  Constitutional:  Negative for chills and fever.  HENT:  Negative for sore throat.   Eyes:  Negative for blurred vision.  Respiratory:  Negative for cough, sputum production, shortness of breath and wheezing.   Cardiovascular:  Negative for chest pain and palpitations.  Gastrointestinal:  Negative for constipation, diarrhea, heartburn, nausea and vomiting.  Genitourinary:  Negative for dysuria, frequency and urgency.  Musculoskeletal:  Negative for myalgias.  Skin:  Negative for itching and rash.  Neurological:  Negative for headaches.  Endo/Heme/Allergies:        See allergy listing  Psychiatric/Behavioral:  Positive for depression. Negative for hallucinations and suicidal ideas.    Blood pressure 101/76, pulse 64, temperature 98.1 F (36.7 C), temperature source Oral, resp. rate 12,  height 4\' 11"  (1.499 m), weight 63 kg, SpO2 97%. Body mass index is 28.05 kg/m.  Treatment Plan Summary: Daily contact with patient to assess and evaluate symptoms and progress in treatment and Medication management   ASSESSMENT: Yolanda [name] is a 47 y.o. female  with a past psychiatric history of schizoaffective disorder, bipolar type, multiple prior suicide attempts, cognitive delay on disability, and a history of medication nonadherence.  Admitted to [facility] from [facility] with complaints of worsening suicidal ideations with plan to stab herself.   Diagnoses / Active Problems: Schizoaffective disorder bipolar type   PLAN: Safety and Monitoring:             --  INVOLUNTARY admission to inpatient psychiatric unit for safety, stabilization and treatment             -- Daily contact with patient to assess and evaluate symptoms and progress in treatment             -- Patient's case to be discussed in multi-disciplinary team meeting             -- Observation Level : q15 minute checks             -- Vital signs:  q12 hours             -- Precautions: suicide, elopement, and assault   2. Treatment plan. -Trazodone 50 mg nightly for insomnia - Melatonin 5 mg nightly for insomnia - Zoloft 150 mg daily for depression - Paliperidone 6 mg daily for psychosis - BuSpar 15 mg twice daily for anxiety.  -- The risks/benefits/side-effects/alternatives  to this medication were discussed in detail with the patient and time was given for questions. The patient consents to medication trial.              -- Metabolic profile and EKG monitoring obtained while on an atypical antipsychotic.  Other PRNS:  acetaminophen, alum & mag hydroxide-simeth, diphenhydrAMINE **OR** diphenhydrAMINE, haloperidol **OR** haloperidol lactate, hydrOXYzine, LORazepam **OR** LORazepam, magnesium hydroxide, nicotine polacrilex, polyethylene glycol, SUMAtriptan               3. Medical Issues Being Addressed:   Other medications include: scheduled Colace, hydrocortisone cream, Protonix 40 mg for GERD   # Vitamin D deficiency Vitamin D capsule 50,000 units q. 7 days   4. Discharge Planning:              -- Social work and case management to assist with discharge planning and identification of hospital follow-up needs prior to discharge             -- Estimated Discharge Date:               -- Apparently patient now has a legal guardian. Placement is in progress. The patient may not have the option to go to the mother's house unless there is 24/7 supervision.  06/13/2023: LCSW, patient, and Guardian Ad Litem had a meeting yesterday regarding patient discharge 06/12/2023.  Please see social worker's notes. May have placement for next week at a group home.               -- Discharge Concerns: Need to establish a safety plan; Medication compliance and effectiveness             -- Discharge Goals: Return home with outpatient referrals for mental health follow-up including medication management/psychotherapy    I certify that inpatient services furnished can reasonably be expected to improve the patient's condition.    Cecilie Lowers, FNP 06/25/2023, 5:52 PMPatient ID: Yolanda [name], female   DOB: 1975-09-13, 46 y.o.   MRN: [id number] Patient ID: Yolanda [name], female   DOB: 01/10/1976, 47 y.o.   MRN: [id number]

## 2023-06-25 NOTE — Group Note (Signed)
Date:  06/25/2023 Time:  12:01 PM  Group Topic/Focus:  Orientation:   The focus of this group is to educate the patient on the purpose and policies of crisis stabilization and provide a format to answer questions about their admission.  The group details unit policies and expectations of patients while admitted.    Participation Level:  Minimal  Participation Quality:  Attentive  Affect:  Appropriate  Cognitive:  Appropriate  Insight: Appropriate  Engagement in Group:  Limited  Modes of Intervention:  Discussion  Additional Comments:     [name] 06/25/2023, 12:01 PM

## 2023-06-25 NOTE — Progress Notes (Signed)
   06/25/23 2030  Psych Admission Type (Psych Patients Only)  Admission Status Involuntary  Psychosocial Assessment  Patient Complaints Depression  Eye Contact Fair  Facial Expression Flat;Sad  Affect Appropriate to circumstance  Speech Soft  Interaction Assertive  Motor Activity Tremors  Appearance/Hygiene Disheveled;Body odor  Behavior Characteristics Cooperative  Mood Depressed  Aggressive Behavior  Effect No apparent injury  Thought Process  Coherency WDL  Content WDL  Delusions WDL  Perception WDL  Hallucination None reported or observed  Judgment Impaired  Confusion WDL  Danger to Self  Current suicidal ideation? Denies

## 2023-06-25 NOTE — [facility] Group Notes (Signed)
[facility] Group Notes:  (Nursing/MHT/Case Management/Adjunct)  Date:  06/25/2023  Time:  2000  Type of Therapy:   wrap up group  Participation Level:  Did Not Attend  Participation Quality:   Did not attend  Affect:   Did not attend  Cognitive:   Did not attend  Insight:  None  Engagement in Group:   Did not attend  Modes of Intervention:   Did not attend  Summary of Progress/Problems:  Yolanda [name] 06/25/2023, 9:52 PM

## 2023-06-25 NOTE — Plan of Care (Signed)
  Problem: Activity: Goal: Sleeping patterns will improve Outcome: Progressing   Problem: Coping: Goal: Coping ability will improve Outcome: Progressing   Problem: Health Behavior/Discharge Planning: Goal: Identification of resources available to assist in meeting health care needs will improve Outcome: Progressing   Problem: Activity: Goal: Interest or engagement in activities will improve Outcome: Not Progressing

## 2023-06-25 NOTE — Group Note (Signed)
LCSW Group Therapy Note  Group Date: 06/25/2023 Start Time: 1100 End Time: 1200   Type of Therapy and Topic:  Group Therapy - Healthy vs Unhealthy Coping Skills  Participation Level:  Minimal   Description of Group The focus of this group was to determine what unhealthy coping techniques typically are used by group members and what healthy coping techniques would be helpful in coping with various problems. Patients were guided in becoming aware of the differences between healthy and unhealthy coping techniques. Patients were asked to identify 2-3 healthy coping skills they would like to learn to use more effectively.  Therapeutic Goals Patients learned that coping is what human beings do all day long to deal with various situations in their lives Patients defined and discussed healthy vs unhealthy coping techniques Patients identified their preferred coping techniques and identified whether these were healthy or unhealthy Patients determined 2-3 healthy coping skills they would like to become more familiar with and use more often. Patients provided support and ideas to each other   Summary of Patient Progress:  During group, Teffany expressed some feedback. Patient proved open to input from peers and feedback from CSW. Patient demonstrated good insight into the subject matter, was respectful of peers, and participated throughout the entire session.   Therapeutic Modalities Cognitive Behavioral Therapy Motivational Interviewing  Marinda Elk, [place] 06/25/2023  1:26 PM

## 2023-06-25 NOTE — Plan of Care (Signed)
  Problem: Education: Goal: Knowledge of [facility] Education information/materials will improve Outcome: Progressing Goal: Emotional status will improve Outcome: Progressing Goal: Mental status will improve Outcome: Progressing Goal: Verbalization of understanding the information provided will improve Outcome: Progressing   Problem: Activity: Goal: Interest or engagement in activities will improve Outcome: Progressing Goal: Sleeping patterns will improve Outcome: Progressing   Problem: Coping: Goal: Ability to verbalize frustrations and anger appropriately will improve Outcome: Progressing Goal: Ability to demonstrate self-control will improve Outcome: Progressing   Problem: Health Behavior/Discharge Planning: Goal: Identification of resources available to assist in meeting health care needs will improve Outcome: Progressing Goal: Compliance with treatment plan for underlying cause of condition will improve Outcome: Progressing   Problem: Physical Regulation: Goal: Ability to maintain clinical measurements within normal limits will improve Outcome: Progressing   Problem: Safety: Goal: Periods of time without injury will increase Outcome: Progressing   Problem: Education: Goal: Ability to make informed decisions regarding treatment will improve Outcome: Progressing   Problem: Coping: Goal: Coping ability will improve Outcome: Progressing   Problem: Health Behavior/Discharge Planning: Goal: Identification of resources available to assist in meeting health care needs will improve Outcome: Progressing   Problem: Medication: Goal: Compliance with prescribed medication regimen will improve Outcome: Progressing   Problem: Self-Concept: Goal: Ability to disclose and discuss suicidal ideas will improve Outcome: Progressing Goal: Will verbalize positive feelings about self Outcome: Progressing Note: Patient is on track. Patient will maintain adherence

## 2023-06-25 NOTE — Group Note (Signed)
Recreation Therapy Group Note   Group Topic:Animal Assisted Therapy   Group Date: 06/25/2023 Start Time: 0950 End Time: 1030 Facilitators: Shawnia Vizcarrondo-McCall, LRT,CTRS Location: 300 Hall Dayroom   Animal-Assisted Activity (AAA) Program Checklist/Progress Notes Patient Eligibility Criteria Checklist & Daily Group note for Rec Tx Intervention  AAA/T Program Assumption of Risk Form signed by Patient/ or Parent Legal Guardian Yes  Patient understands his/her participation is voluntary Yes  Education: Charity fundraiser, Appropriate Animal Interaction   Education Outcome: Acknowledges education.    Affect/Mood: N/A   Participation Level: Did not attend    Clinical Observations/Individualized Feedback:     Plan: Continue to engage patient in RT group sessions 2-3x/week.   Bradon Fester-McCall, LRT,CTRS 06/25/2023 1:30 PM

## 2023-06-26 DIAGNOSIS — F251 Schizoaffective disorder, depressive type: Secondary | ICD-10-CM | POA: Diagnosis not present

## 2023-06-26 MED ORDER — WHITE PETROLATUM EX OINT
TOPICAL_OINTMENT | CUTANEOUS | Status: AC
  Filled 2023-06-26: qty 5

## 2023-06-26 NOTE — Plan of Care (Signed)
  Problem: Education: Goal: Knowledge of [facility] Education information/materials will improve Outcome: Progressing Goal: Emotional status will improve Outcome: Progressing Goal: Mental status will improve Outcome: Progressing Goal: Verbalization of understanding the information provided will improve Outcome: Progressing   Problem: Activity: Goal: Interest or engagement in activities will improve Outcome: Progressing Goal: Sleeping patterns will improve Outcome: Progressing   Problem: Coping: Goal: Ability to verbalize frustrations and anger appropriately will improve Outcome: Progressing Goal: Ability to demonstrate self-control will improve Outcome: Progressing   Problem: Health Behavior/Discharge Planning: Goal: Identification of resources available to assist in meeting health care needs will improve Outcome: Progressing Goal: Compliance with treatment plan for underlying cause of condition will improve Outcome: Progressing   Problem: Physical Regulation: Goal: Ability to maintain clinical measurements within normal limits will improve Outcome: Progressing   Problem: Safety: Goal: Periods of time without injury will increase Outcome: Progressing   Problem: Education: Goal: Ability to make informed decisions regarding treatment will improve Outcome: Progressing   Problem: Coping: Goal: Coping ability will improve Outcome: Progressing   Problem: Health Behavior/Discharge Planning: Goal: Identification of resources available to assist in meeting health care needs will improve Outcome: Progressing   Problem: Medication: Goal: Compliance with prescribed medication regimen will improve Outcome: Progressing   Problem: Self-Concept: Goal: Ability to disclose and discuss suicidal ideas will improve Outcome: Progressing Goal: Will verbalize positive feelings about self Outcome: Progressing Note: Patient is on track. Patient will maintain adherence

## 2023-06-26 NOTE — Group Note (Signed)
Date:  06/26/2023 Time:  10:24 AM  Group Topic/Focus:  Developing a Wellness Toolbox:   The focus of this group is to help patients develop a "wellness toolbox" with skills and strategies to promote recovery upon discharge. Goals Group:   The focus of this group is to help patients establish daily goals to achieve during treatment and discuss how the patient can incorporate goal setting into their daily lives to aide in recovery.    Participation Level:  Did Not Attend  Participation Quality:   n/a  Affect:   n/a  Cognitive:   n/a  Insight: None  Engagement in Group:   n/a  Modes of Intervention:   n/a  Additional Comments:  n/a  Makenna Macaluso E Abbigail Anstey 06/26/2023, 10:24 AM

## 2023-06-26 NOTE — Plan of Care (Signed)
  Problem: Coping: Goal: Coping ability will improve Outcome: Progressing   Problem: Medication: Goal: Compliance with prescribed medication regimen will improve Outcome: Progressing   Problem: Education: Goal: Mental status will improve Outcome: Progressing

## 2023-06-26 NOTE — Group Note (Signed)
Recreation Therapy Group Note   Group Topic:Other  Group Date: 06/26/2023 Start Time: 1400 End Time: 1435 Facilitators: Rainie Crenshaw-McCall, LRT,CTRS Location: [address] Dayroom   Activity Description/Intervention: Therapeutic Drumming. Patients with peers and staff were given the opportunity to engage in a leader facilitated [facility] Group Empowerment Drumming Circle with staff from the [employer], in partnership with [employer]. Teaching laboratory technician and trained [employer], Theodoro Doing leading with LRT observing and documenting intervention and pt response. This evidenced-based practice targets 7 areas of health and wellbeing in the human experience including: stress-reduction, exercise, self-expression, camaraderie/support, nurturing, spirituality, and music-making (leisure).   Goal Area(s) Addresses:  Patient will engage in pro-social way in music group.  Patient will follow directions of drum leader on the first prompt. Patient will demonstrate no behavioral issues during group.  Patient will identify if a reduction in stress level occurs as a result of participation in therapeutic drum circle.    Education: Leisure exposure, Pharmacologist, Musical expression, Discharge Planning   Affect/Mood: Appropriate   Participation Level: Active   Participation Quality: Independent   Behavior: Appropriate   Speech/Thought Process: Focused   Insight: Fair   Judgement: Fair    Modes of Intervention: Teaching laboratory technician   Patient Response to Interventions:  Receptive   Education Outcome:  Acknowledges education   Clinical Observations/Individualized Feedback: Yolanda [name] came into group late. Pt stayed for about 10 minutes before leaving and not returning.     Plan: Continue to engage patient in RT group sessions 2-3x/week.   [name], LRT,CTRS 06/26/2023 3:31 PM

## 2023-06-26 NOTE — Discharge Planning (Signed)
LCSW attempted to get in contact with Group Home Representatives [name] and Dr. Cheree Ditto on this morning, however received no answer. LCSW left voice message at 8:12am requesting phone call back regarding any updates they may have from [facility] to move forward with authorization. Update has been provided to [facility] Supervisor [name].   LCSW will continue to follow and provide updates as received.   Fernande Boyden, LCSW Clinical Social Worker [facility] Ph: [phone]

## 2023-06-26 NOTE — Progress Notes (Signed)
[facility] MD Progress Note  06/26/2023 1:18 PM Yolanda [name]  MRN:  [id number]  Reason for admission:  This is the first psychiatric admission in this [facility] in 12 years for this AA female with an extensive hx of mental illnesses & probable polysubstance use disorders. She is admitted to the [facility] from the [facility] hospital with complain of worsening suicidal ideations with plan to stab herself. Per chart review, patient apparently reported at the ED that she has been depressed for a while & has not been taking her mental health medications. After medical evaluation.clearance, she was transferred to the [facility] for further psychiatric evaluation/treatments. During this evaluation, Yolanda [name] presents irritated, labile, angry & tearful. She is not forth-coming with the information needed to help manage her care.  She denies delusional thinking or paranoia.  Today's assessment notes:  Patient is seen and examined in her room lying on her bed. Reports she is happy today, because she spoke with her daughter last night, and was updated on her grandchild's wellbeing.  Presenting with smiles this morning during assessment.  Report mood is less depressed and rates depression as 0/10.  She reports anxiety at a manageable level.  She reports good appetite.  Nursing staff report patient sleeping about 10 hours last night and being restful.  Patient reports her goal today is to attend therapy and unit group activities. Patient continues to be incapable of independent living due to intellectual disability in addition to chronic mental health issues.  Discharge is pending at this time. No changes in her current medication regimen and patient is compliant with treatment regimen. She denies SI, HI, or AVH.  No acute discomfort verbalized.  Principal Problem: Schizoaffective disorder, depressive type (HCC) Diagnosis: Principal Problem:   Schizoaffective disorder, depressive type (HCC) Active Problems:   GERD (gastroesophageal  reflux disease)   Intellectual disability   Tobacco use disorder  Total Time spent with patient: 35 minutes  Past Psychiatric History: Previous Psychotropic Medications: Mirtazapine, Invega sustenna, Gabapentin, Cogentin, Buspar, Naltrexone, Abilify.   Past Medical History:  Past Medical History:  Diagnosis Date   Bipolar affect, depressed (HCC)    Constipation 08/17/2022   Depression    Falls 07/21/2022   Fracture of femoral neck, right, closed (HCC) 01/17/2022   Herpes simplex 08/22/2017   Open fracture dislocation of right elbow joint 01/17/2022    Past Surgical History:  Procedure Laterality Date   NO PAST SURGERIES     SALPINGECTOMY     Family History: History reviewed. No pertinent family history. Family Psychiatric  History:  Patient reports that everyone in her family has bipolar disorder.  Social History:  Social History   Substance and Sexual Activity  Alcohol Use Yes     Social History   Substance and Sexual Activity  Drug Use Yes   Types: Cocaine, Marijuana    Social History   Socioeconomic History   Marital status: Single    Spouse name: Not on file   Number of children: Not on file   Years of education: Not on file   Highest education level: Not on file  Occupational History   Not on file  Tobacco Use   Smoking status: Every Day   Smokeless tobacco: Not on file  Substance and Sexual Activity   Alcohol use: Yes   Drug use: Yes    Types: Cocaine, Marijuana   Sexual activity: Yes  Other Topics Concern   Not on file  Social History Narrative   Not on file  Social Drivers of Corporate investment banker Strain: Low Risk  (09/12/2022)   Received from [facility]   Overall Financial Resource Strain (CARDIA)    Difficulty of Paying Living Expenses: Not hard at all  Food Insecurity: Patient Declined (12/20/2022)   Hunger Vital Sign    Worried About Running Out of Food in the Last Year: Patient declined    Ran Out of Food in the  Last Year: Patient declined  Transportation Needs: No Transportation Needs (12/20/2022)   PRAPARE - Administrator, Civil Service (Medical): No    Lack of Transportation (Non-Medical): No  Physical Activity: Not on file  Stress: No Stress Concern Present (07/17/2022)   Received from [facility] - Occupational Stress Questionnaire    Feeling of Stress : Not at all  Social Connections: Unknown (07/16/2022)   Received from [facility]   Social Network    Social Network: Not on file   Additional Social History:    Sleep: Good  Appetite:  Fair  Current Medications: Current Facility-Administered Medications  Medication Dose Route Frequency Provider Last Rate Last Admin   acetaminophen (TYLENOL) tablet 650 mg  650 mg Oral Q6H PRN [name], NP   650 mg at 06/23/23 1525   alum & mag hydroxide-simeth (MAALOX/MYLANTA) 200-200-20 MG/5ML suspension 30 mL  30 mL Oral Q4H PRN Onuoha, Chinwendu V, NP   30 mL at 05/31/23 0801   busPIRone (BUSPAR) tablet 15 mg  15 mg Oral BID [name], NP   15 mg at 06/26/23 0801   cyanocobalamin (VITAMIN B12) injection 1,000 mcg  1,000 mcg Intramuscular Q30 days Sarita Bottom, MD   1,000 mcg at 06/02/23 1129   diphenhydrAMINE (BENADRYL) capsule 50 mg  50 mg Oral TID PRN [name], NP   50 mg at 06/03/23 1110   Or   diphenhydrAMINE (BENADRYL) injection 50 mg  50 mg Intramuscular TID PRN [name], NP       docusate sodium (COLACE) capsule 100 mg  100 mg Oral Daily Nkwenti, Tyler Aas, NP   100 mg at 06/26/23 0801   haloperidol (HALDOL) tablet 5 mg  5 mg Oral TID PRN [name], NP   5 mg at 06/03/23 1110   Or   haloperidol lactate (HALDOL) injection 5 mg  5 mg Intramuscular TID PRN [name], NP       hydrocortisone cream 1 %   Topical BID Cecilie Lowers, FNP   Given at 06/26/23 0802   hydrOXYzine (ATARAX) tablet 25 mg  25 mg Oral TID PRN [name], DO   25  mg at 05/27/23 1146   LORazepam (ATIVAN) tablet 2 mg  2 mg Oral TID PRN [name], NP   2 mg at 06/03/23 1110   Or   LORazepam (ATIVAN) injection 2 mg  2 mg Intramuscular TID PRN [name], NP       magnesium hydroxide (MILK OF MAGNESIA) suspension 30 mL  30 mL Oral Daily PRN [name], NP   30 mL at 06/23/23 1856   melatonin tablet 5 mg  5 mg Oral QHS Nkwenti, Doris, NP   5 mg at 06/25/23 2107   nicotine polacrilex (NICORETTE) gum 2 mg  2 mg Oral PRN [name], MD   2 mg at 06/25/23 1336   paliperidone (INVEGA) 24 hr tablet 6 mg  6 mg Oral Daily [name], NP  6 mg at 06/25/23 2107   pantoprazole (PROTONIX) EC tablet 40 mg  40 mg Oral Daily Nwoko, Nicole Kindred I, NP   40 mg at 06/26/23 0802   polyethylene glycol (MIRALAX / GLYCOLAX) packet 17 g  17 g Oral Daily PRN [name], MD   17 g at 06/07/23 1847   polyethylene glycol (MIRALAX / GLYCOLAX) packet 17 g  17 g Oral Daily [name], MD   17 g at 06/24/23 0744   sertraline (ZOLOFT) tablet 150 mg  150 mg Oral Daily Massengill, Harrold Donath, MD   150 mg at 06/26/23 0802   SUMAtriptan (IMITREX) tablet 25 mg  25 mg Oral BID PRN [name], NP   25 mg at 05/07/23 2112   traZODone (DESYREL) tablet 50 mg  50 mg Oral QHS [name], NP   50 mg at 06/25/23 2107   Vitamin D (Ergocalciferol) (DRISDOL) 1.25 MG (50000 UNIT) capsule 50,000 Units  50,000 Units Oral Q7 days [name], NP   50,000 Units at 06/22/23 1524   Lab Results:  No results found for this or any previous visit (from the past 48 hours).  Blood Alcohol level:  Lab Results  Component Value Date   ETH <10 12/19/2022   ETH <10 11/21/2019   Metabolic Disorder Labs: Lab Results  Component Value Date   HGBA1C 5.6 06/16/2023   MPG 114.02 06/16/2023   MPG 79.58 12/21/2022   No results found for: "PROLACTIN" Lab Results  Component Value Date   CHOL 256 (H) 06/16/2023   TRIG 224 (H) 06/16/2023   HDL 83 06/16/2023   CHOLHDL 3.1 06/16/2023   VLDL  45 (H) 06/16/2023   LDLCALC 128 (H) 06/16/2023   LDLCALC 149 (H) 12/21/2022    Physical Findings: AIMS: Facial and Oral Movements Muscles of Facial Expression: None Lips and Perioral Area: None Jaw: None Tongue: None,Extremity Movements Upper (arms, wrists, hands, fingers): None Lower (legs, knees, ankles, toes): None, Trunk Movements Neck, shoulders, hips: None, Global Judgements Severity of abnormal movements overall : None Incapacitation due to abnormal movements: None Patient's awareness of abnormal movements: No Awareness, Dental Status Current problems with teeth and/or dentures?: No Does patient usually wear dentures?: No  CIWA:    COWS:     Musculoskeletal: Strength & Muscle Tone: within normal limits Gait & Station: normal Patient leans: N/A  Psychiatric Specialty Exam:  Presentation  General Appearance:  Disheveled  Eye Contact: Fair  Speech: Clear and Coherent  Speech Volume: Normal (Slightly garbled sometimes)  Handedness: Right  Mood and Affect  Mood: Euthymic  Affect: Congruent  Thought Process  Thought Processes: Coherent  Descriptions of Associations:Intact  Orientation:Full (Time, Place and Person)  Thought Content:Logical  History of Schizophrenia/Schizoaffective disorder:Yes  Duration of Psychotic Symptoms:Greater than six months  Hallucinations:Hallucinations: None Description of Auditory Hallucinations: Denies  Ideas of Reference:None  Suicidal Thoughts:Suicidal Thoughts: No SI Active Intent and/or Plan: Without Intent; Without Plan; Without Means to Carry Out SI Passive Intent and/or Plan: Without Intent; Without Plan; Without Means to Carry Out  Homicidal Thoughts:Homicidal Thoughts: No  Sensorium  Memory: Immediate Fair; Recent Fair  Judgment: Fair  Insight: -- (Limited and childlike)  Executive Functions  Concentration: Fair  Attention Span: Fair  Recall: [employer] of  Knowledge: Fair  Language: Fair  Psychomotor Activity  Psychomotor Activity:Psychomotor Activity: Normal  Assets  Assets: Manufacturing systems engineer; Physical Health; Resilience  Sleep  Sleep:Sleep: Good Number of Hours of Sleep: 10  Physical Exam: Physical Exam Vitals and nursing note  reviewed.  Constitutional:      Appearance: Normal appearance.  HENT:     Head: Normocephalic and atraumatic.     Mouth/Throat:     Mouth: Mucous membranes are moist.     Pharynx: Oropharynx is clear.  Eyes:     Extraocular Movements: Extraocular movements intact.  Cardiovascular:     Rate and Rhythm: Normal rate.     Pulses: Normal pulses.  Pulmonary:     Effort: Pulmonary effort is normal.  Abdominal:     Comments: Deferred  Genitourinary:    Comments: Deferred Musculoskeletal:        General: Normal range of motion.     Cervical back: Normal range of motion.  Skin:    General: Skin is warm.  Neurological:     General: No focal deficit present.     Mental Status: She is alert.  Psychiatric:        Mood and Affect: Mood normal.        Behavior: Behavior normal.    Review of Systems  Constitutional:  Negative for chills and fever.  HENT:  Negative for sore throat.   Eyes:  Negative for blurred vision.  Respiratory:  Negative for cough, sputum production, shortness of breath and wheezing.   Cardiovascular:  Negative for chest pain and palpitations.  Gastrointestinal:  Negative for constipation, diarrhea, heartburn, nausea and vomiting.  Genitourinary:  Negative for dysuria, frequency and urgency.  Musculoskeletal:  Negative for myalgias.  Skin:  Negative for itching and rash.  Neurological:  Negative for headaches.  Endo/Heme/Allergies:        See allergy listing  Psychiatric/Behavioral:  Positive for depression. Negative for hallucinations and suicidal ideas.    Blood pressure 110/78, pulse 81, temperature 98.4 F (36.9 C), temperature source Oral, resp. rate 12, height 4'  11" (1.499 m), weight 63 kg, SpO2 99%. Body mass index is 28.05 kg/m.  Treatment Plan Summary: Daily contact with patient to assess and evaluate symptoms and progress in treatment and Medication management   ASSESSMENT: Yolanda [name] is a 47 y.o. female  with a past psychiatric history of schizoaffective disorder, bipolar type, multiple prior suicide attempts, cognitive delay on disability, and a history of medication nonadherence.  Admitted to [facility] from [facility] with complaints of worsening suicidal ideations with plan to stab herself.   Diagnoses / Active Problems: Schizoaffective disorder bipolar type   PLAN: Safety and Monitoring:             --  INVOLUNTARY admission to inpatient psychiatric unit for safety, stabilization and treatment             -- Daily contact with patient to assess and evaluate symptoms and progress in treatment             -- Patient's case to be discussed in multi-disciplinary team meeting             -- Observation Level : q15 minute checks             -- Vital signs:  q12 hours             -- Precautions: suicide, elopement, and assault   2. Treatment plan. -Trazodone 50 mg nightly for insomnia - Melatonin 5 mg nightly for insomnia - Zoloft 150 mg daily for depression - Paliperidone 6 mg daily for psychosis - BuSpar 15 mg twice daily for anxiety.  -- The risks/benefits/side-effects/alternatives to this medication were discussed in detail with the patient and  time was given for questions. The patient consents to medication trial.              -- Metabolic profile and EKG monitoring obtained while on an atypical antipsychotic.  Other PRNS:  acetaminophen, alum & mag hydroxide-simeth, diphenhydrAMINE **OR** diphenhydrAMINE, haloperidol **OR** haloperidol lactate, hydrOXYzine, LORazepam **OR** LORazepam, magnesium hydroxide, nicotine polacrilex, polyethylene glycol, SUMAtriptan               3. Medical Issues Being Addressed:  Other  medications include: scheduled Colace, hydrocortisone cream, Protonix 40 mg for GERD   # Vitamin D deficiency Vitamin D capsule 50,000 units q. 7 days   4. Discharge Planning:              -- Social work and case management to assist with discharge planning and identification of hospital follow-up needs prior to discharge             -- Estimated Discharge Date:               -- Apparently patient now has a legal guardian. Placement is in progress. The patient may not have the option to go to the mother's house unless there is 24/7 supervision.  06/13/2023: LCSW, patient, and Guardian Ad Litem had a meeting yesterday regarding patient discharge 06/12/2023.  Please see social worker's notes. May have placement for next week at a group home.               -- Discharge Concerns: Need to establish a safety plan; Medication compliance and effectiveness             -- Discharge Goals: Return home with outpatient referrals for mental health follow-up including medication management/psychotherapy    I certify that inpatient services furnished can reasonably be expected to improve the patient's condition.    Cecilie Lowers, FNP 06/26/2023, 1:18 PMPatient ID: Yolanda [name], female   DOB: 11/29/1975, 47 y.o.   MRN: [id number] Patient ID: Yolanda [name], female   DOB: August 18, 1975, 47 y.o.   MRN: [id number] Patient ID: Yolanda [name], female   DOB: [birth year], 47 y.o.   MRN: [id number]

## 2023-06-26 NOTE — [facility] Group Notes (Signed)
[facility] Group Notes:  (Nursing/MHT/Case Management/Adjunct)  Date:  06/26/2023  Time:  9:21 PM  Type of Therapy:  Psychoeducational Skills  Participation Level:  None  Participation Quality:  Resistant  Affect:  Resistant  Cognitive:  Lacking  Insight:  None  Engagement in Group:  None  Modes of Intervention:  Education  Summary of Progress/Problems: Patient did not attend the group this evening.   Hazle Coca S 06/26/2023, 9:21 PM

## 2023-06-26 NOTE — Progress Notes (Signed)
   06/26/23 2113  Psych Admission Type (Psych Patients Only)  Admission Status Involuntary  Psychosocial Assessment  Patient Complaints Depression  Eye Contact Fair  Facial Expression Animated  Affect Appropriate to circumstance  Speech Soft  Interaction Assertive  Motor Activity Tremors  Appearance/Hygiene Improved  Behavior Characteristics Cooperative  Mood Pleasant  Thought Process  Coherency WDL  Content WDL  Delusions None reported or observed  Perception WDL  Hallucination None reported or observed  Judgment Impaired  Confusion WDL  Danger to Self  Current suicidal ideation? Denies  Danger to Others  Danger to Others None reported or observed

## 2023-06-26 NOTE — BHH Group Notes (Signed)

## 2023-06-27 DIAGNOSIS — F251 Schizoaffective disorder, depressive type: Secondary | ICD-10-CM | POA: Diagnosis not present

## 2023-06-27 NOTE — Progress Notes (Signed)
[facility] MD Progress Note  06/27/2023 5:51 PM Yolanda [name]  MRN:  [id number]  Reason for admission:  This is the first psychiatric admission in this [facility] in 12 years for this AA female with an extensive hx of mental illnesses & probable polysubstance use disorders. She is admitted to the [facility] from the [facility] hospital with complain of worsening suicidal ideations with plan to stab herself. Per chart review, patient apparently reported at the ED that she has been depressed for a while & has not been taking her mental health medications. After medical evaluation.clearance, she was transferred to the [facility] for further psychiatric evaluation/treatments. During this evaluation, Yolanda [name] presents irritated, labile, angry & tearful. She is not forth-coming with the information needed to help manage her care.  She denies delusional thinking or paranoia.  Today's assessment notes:  During encounter, patient is lying in bed and reports she does not want to attend therapeutic milieu or unit activities at the present time, however, want to get some more rest.  She is alert, oriented to person, place, time and situation.  Reports good sleep hygiene last night and being restful.  Nursing staff report patient sleeping over 9 hours last night.  Reports appetite is good, and her mood is less depressed.  Reports anxiety is at manageable level.  She denies delusional thinking or paranoia.  Further denies SI, HI, or AVH.  Reports she plans to keep studying for her GED work today.  No acute discomfort, and no changes in the treatment plan.  Patient continues to be incapable of independent living due to intellectual disability in addition to chronic mental health issues.  Discharge is pending at this time.  Principal Problem: Schizoaffective disorder, depressive type (HCC) Diagnosis: Principal Problem:   Schizoaffective disorder, depressive type (HCC) Active Problems:   GERD (gastroesophageal reflux disease)   Intellectual  disability   Tobacco use disorder  Total Time spent with patient: 35 minutes  Past Psychiatric History: Previous Psychotropic Medications: Mirtazapine, Invega sustenna, Gabapentin, Cogentin, Buspar, Naltrexone, Abilify.   Past Medical History:  Past Medical History:  Diagnosis Date   Bipolar affect, depressed (HCC)    Constipation 08/17/2022   Depression    Falls 07/21/2022   Fracture of femoral neck, right, closed (HCC) 01/17/2022   Herpes simplex 08/22/2017   Open fracture dislocation of right elbow joint 01/17/2022    Past Surgical History:  Procedure Laterality Date   NO PAST SURGERIES     SALPINGECTOMY     Family History: History reviewed. No pertinent family history. Family Psychiatric  History:  Patient reports that everyone in her family has bipolar disorder.  Social History:  Social History   Substance and Sexual Activity  Alcohol Use Yes     Social History   Substance and Sexual Activity  Drug Use Yes   Types: Cocaine, Marijuana    Social History   Socioeconomic History   Marital status: Single    Spouse name: Not on file   Number of children: Not on file   Years of education: Not on file   Highest education level: Not on file  Occupational History   Not on file  Tobacco Use   Smoking status: Every Day   Smokeless tobacco: Not on file  Substance and Sexual Activity   Alcohol use: Yes   Drug use: Yes    Types: Cocaine, Marijuana   Sexual activity: Yes  Other Topics Concern   Not on file  Social History Narrative   Not  on file   Social Drivers of Health   Financial Resource Strain: Low Risk  (09/12/2022)   Received from [facility], [facility]   Overall Financial Resource Strain (CARDIA)    Difficulty of Paying Living Expenses: Not hard at all  Food Insecurity: Patient Declined (12/20/2022)   Hunger Vital Sign    Worried About Running Out of Food in the Last Year: Patient declined    Ran Out of Food in the Last Year: Patient declined   Transportation Needs: No Transportation Needs (12/20/2022)   PRAPARE - Administrator, Civil Service (Medical): No    Lack of Transportation (Non-Medical): No  Physical Activity: Not on file  Stress: No Stress Concern Present (07/17/2022)   Received from [facility], [facility] of Occupational Health - Occupational Stress Questionnaire    Feeling of Stress : Not at all  Social Connections: Unknown (07/16/2022)   Received from [facility], [facility]   Social Network    Social Network: Not on file   Additional Social History:    Sleep: Good  Appetite:  Fair  Current Medications: Current Facility-Administered Medications  Medication Dose Route Frequency Provider Last Rate Last Admin   acetaminophen (TYLENOL) tablet 650 mg  650 mg Oral Q6H PRN [name], NP   650 mg at 06/23/23 1525   alum & mag hydroxide-simeth (MAALOX/MYLANTA) 200-200-20 MG/5ML suspension 30 mL  30 mL Oral Q4H PRN Onuoha, Chinwendu V, NP   30 mL at 05/31/23 0801   busPIRone (BUSPAR) tablet 15 mg  15 mg Oral BID [name], NP   15 mg at 06/27/23 1732   cyanocobalamin (VITAMIN B12) injection 1,000 mcg  1,000 mcg Intramuscular Q30 days [name], Nadir, MD   1,000 mcg at 06/02/23 1129   diphenhydrAMINE (BENADRYL) capsule 50 mg  50 mg Oral TID PRN [name], NP   50 mg at 06/03/23 1110   Or   diphenhydrAMINE (BENADRYL) injection 50 mg  50 mg Intramuscular TID PRN [name], NP       docusate sodium (COLACE) capsule 100 mg  100 mg Oral Daily Nkwenti, Tyler Aas, NP   100 mg at 06/27/23 4742   haloperidol (HALDOL) tablet 5 mg  5 mg Oral TID PRN [name], NP   5 mg at 06/03/23 1110   Or   haloperidol lactate (HALDOL) injection 5 mg  5 mg Intramuscular TID PRN [name], NP       hydrocortisone cream 1 %   Topical BID Cecilie Lowers, FNP   1 Application at 06/27/23 1732   hydrOXYzine (ATARAX) tablet 25 mg  25 mg Oral TID PRN [name], DO   25 mg at 05/27/23 1146    LORazepam (ATIVAN) tablet 2 mg  2 mg Oral TID PRN [name], NP   2 mg at 06/03/23 1110   Or   LORazepam (ATIVAN) injection 2 mg  2 mg Intramuscular TID PRN [name], NP       magnesium hydroxide (MILK OF MAGNESIA) suspension 30 mL  30 mL Oral Daily PRN [name], NP   30 mL at 06/23/23 1856   melatonin tablet 5 mg  5 mg Oral QHS Nkwenti, Doris, NP   5 mg at 06/26/23 2113   nicotine polacrilex (NICORETTE) gum 2 mg  2 mg Oral PRN [name], MD   2 mg at 06/26/23 2117   paliperidone (INVEGA) 24 hr tablet 6 mg  6 mg Oral  Daily [name], NP   6 mg at 06/26/23 2113   pantoprazole (PROTONIX) EC tablet 40 mg  40 mg Oral Daily Nwoko, Nicole Kindred I, NP   40 mg at 06/27/23 0837   polyethylene glycol (MIRALAX / GLYCOLAX) packet 17 g  17 g Oral Daily PRN [name], MD   17 g at 06/07/23 1847   polyethylene glycol (MIRALAX / GLYCOLAX) packet 17 g  17 g Oral Daily [name], MD   17 g at 06/27/23 0839   sertraline (ZOLOFT) tablet 150 mg  150 mg Oral Daily Massengill, Harrold Donath, MD   150 mg at 06/27/23 4401   SUMAtriptan (IMITREX) tablet 25 mg  25 mg Oral BID PRN [name], NP   25 mg at 05/07/23 2112   traZODone (DESYREL) tablet 50 mg  50 mg Oral QHS [name], NP   50 mg at 06/26/23 2113   Vitamin D (Ergocalciferol) (DRISDOL) 1.25 MG (50000 UNIT) capsule 50,000 Units  50,000 Units Oral Q7 days [name], NP   50,000 Units at 06/22/23 1524   Lab Results:  No results found for this or any previous visit (from the past 48 hours).  Blood Alcohol level:  Lab Results  Component Value Date   ETH <10 12/19/2022   ETH <10 11/21/2019   Metabolic Disorder Labs: Lab Results  Component Value Date   HGBA1C 5.6 06/16/2023   MPG 114.02 06/16/2023   MPG 79.58 12/21/2022   No results found for: "PROLACTIN" Lab Results  Component Value Date   CHOL 256 (H) 06/16/2023   TRIG 224 (H) 06/16/2023   HDL 83 06/16/2023   CHOLHDL 3.1 06/16/2023   VLDL 45 (H) 06/16/2023    LDLCALC 128 (H) 06/16/2023   LDLCALC 149 (H) 12/21/2022   Physical Findings: AIMS: Facial and Oral Movements Muscles of Facial Expression: None Lips and Perioral Area: None Jaw: None Tongue: None,Extremity Movements Upper (arms, wrists, hands, fingers): None Lower (legs, knees, ankles, toes): None, Trunk Movements Neck, shoulders, hips: None, Global Judgements Severity of abnormal movements overall : None Incapacitation due to abnormal movements: None Patient's awareness of abnormal movements: No Awareness, Dental Status Current problems with teeth and/or dentures?: No Does patient usually wear dentures?: No  CIWA:    COWS:     Musculoskeletal: Strength & Muscle Tone: within normal limits Gait & Station: normal Patient leans: N/A  Psychiatric Specialty Exam:  Presentation  General Appearance:  Appropriate for Environment; Fairly Groomed  Eye Contact: Fair  Speech: Clear and Coherent  Speech Volume: Normal  Handedness: Right  Mood and Affect  Mood: Euthymic  Affect: Appropriate  Thought Process  Thought Processes: Coherent  Descriptions of Associations:Intact  Orientation:Full (Time, Place and Person)  Thought Content:Logical  History of Schizophrenia/Schizoaffective disorder:Yes  Duration of Psychotic Symptoms:Greater than six months  Hallucinations:Hallucinations: None Description of Auditory Hallucinations: Denies  Ideas of Reference:None  Suicidal Thoughts:Suicidal Thoughts: No SI Active Intent and/or Plan: -- (Denies) SI Passive Intent and/or Plan: -- (Denies)  Homicidal Thoughts:Homicidal Thoughts: No  Sensorium  Memory: Immediate Fair; Recent Fair  Judgment: Fair  Insight: Fair  Chartered certified accountant: Fair  Attention Span: Fair  Recall: [employer] of Knowledge: Fair  Language: Fair  Psychomotor Activity  Psychomotor Activity:Psychomotor Activity: Normal  Assets  Assets: Investment banker, corporate; Desire for Improvement; Physical Health; Resilience  Sleep  Sleep:Sleep: Good Number of Hours of Sleep: 9  Physical Exam: Physical Exam Vitals and nursing note reviewed.  Constitutional:  Appearance: Normal appearance.  HENT:     Head: Normocephalic and atraumatic.     Mouth/Throat:     Mouth: Mucous membranes are moist.     Pharynx: Oropharynx is clear.  Eyes:     Extraocular Movements: Extraocular movements intact.  Cardiovascular:     Rate and Rhythm: Normal rate.     Pulses: Normal pulses.  Pulmonary:     Effort: Pulmonary effort is normal.  Abdominal:     Comments: Deferred  Genitourinary:    Comments: Deferred Musculoskeletal:        General: Normal range of motion.     Cervical back: Normal range of motion.  Skin:    General: Skin is warm.  Neurological:     General: No focal deficit present.     Mental Status: She is alert and oriented to person, place, and time.  Psychiatric:        Mood and Affect: Mood normal.        Behavior: Behavior normal.    Review of Systems  Constitutional:  Negative for chills and fever.  HENT:  Negative for sore throat.   Eyes:  Negative for blurred vision.  Respiratory:  Negative for cough, sputum production, shortness of breath and wheezing.   Cardiovascular:  Negative for chest pain and palpitations.  Gastrointestinal:  Negative for constipation, diarrhea, heartburn, nausea and vomiting.  Genitourinary:  Negative for dysuria, frequency and urgency.  Musculoskeletal:  Negative for myalgias.  Skin:  Negative for itching and rash.  Neurological:  Negative for headaches.  Endo/Heme/Allergies:        See allergy listing  Psychiatric/Behavioral:  Negative for depression, hallucinations and suicidal ideas.    Blood pressure 91/77, pulse 85, temperature 98.4 F (36.9 C), temperature source Oral, resp. rate 12, height 4\' 11"  (1.499 m), weight 63 kg, SpO2 98%. Body mass index is 28.05 kg/m.  Treatment Plan  Summary: Daily contact with patient to assess and evaluate symptoms and progress in treatment and Medication management   ASSESSMENT: Yolanda [name] is a 48 y.o. female  with a past psychiatric history of schizoaffective disorder, bipolar type, multiple prior suicide attempts, cognitive delay on disability, and a history of medication nonadherence.  Admitted to [name] from [facility] with complaints of worsening suicidal ideations with plan to stab herself.   Diagnoses / Active Problems: Schizoaffective disorder bipolar type   PLAN: Safety and Monitoring:             --  INVOLUNTARY admission to inpatient psychiatric unit for safety, stabilization and treatment             -- Daily contact with patient to assess and evaluate symptoms and progress in treatment             -- Patient's case to be discussed in multi-disciplinary team meeting             -- Observation Level : q15 minute checks             -- Vital signs:  q12 hours             -- Precautions: suicide, elopement, and assault   2. Treatment plan. -Trazodone 50 mg nightly for insomnia - Melatonin 5 mg nightly for insomnia - Zoloft 150 mg daily for depression - Paliperidone 6 mg daily for psychosis - BuSpar 15 mg twice daily for anxiety.  -- The risks/benefits/side-effects/alternatives to this medication were discussed in detail with the patient and time was given  for questions. The patient consents to medication trial.              -- Metabolic profile and EKG monitoring obtained while on an atypical antipsychotic.  Other PRNS:  acetaminophen, alum & mag hydroxide-simeth, diphenhydrAMINE **OR** diphenhydrAMINE, haloperidol **OR** haloperidol lactate, hydrOXYzine, LORazepam **OR** LORazepam, magnesium hydroxide, nicotine polacrilex, polyethylene glycol, SUMAtriptan               3. Medical Issues Being Addressed:  Other medications include: scheduled Colace, hydrocortisone cream, Protonix 40 mg for GERD   #  Vitamin D deficiency Vitamin D capsule 50,000 units q. 7 days   4. Discharge Planning:              -- Social work and case management to assist with discharge planning and identification of hospital follow-up needs prior to discharge             -- Estimated Discharge Date:               -- Apparently patient now has a legal guardian. Placement is in progress. The patient may not have the option to go to the mother's house unless there is 24/7 supervision.  06/13/2023: LCSW, patient, and Guardian Ad Litem had a meeting yesterday regarding patient discharge 06/12/2023.  Please see social worker's notes. May have placement for next week at a group home.               -- Discharge Concerns: Need to establish a safety plan; Medication compliance and effectiveness             -- Discharge Goals: Return home with outpatient referrals for mental health follow-up including medication management/psychotherapy    I certify that inpatient services furnished can reasonably be expected to improve the patient's condition.    Cecilie Lowers, FNP 06/27/2023, 5:51 PMPatient ID: Larey Brick, female   DOB: 12-25-1975, 47 y.o.   MRN: [id number] Patient ID: DESMONIQUE HELFMAN, female   DOB: 06-20-1976, 47 y.o.   MRN: [id number] Patient ID: DANYETTE RANLY, female   DOB: February 16, 1976, 47 y.o.   MRN: [id number] Patient ID: DOTTIE SANTALUCIA, female   DOB: January 01, 1976, 47 y.o.   MRN: [id number]

## 2023-06-27 NOTE — Progress Notes (Signed)
   06/27/23 1400  Psych Admission Type (Psych Patients Only)  Admission Status Involuntary  Psychosocial Assessment  Patient Complaints Depression  Eye Contact Fair  Facial Expression Animated  Affect Appropriate to circumstance  Speech Soft  Interaction Assertive  Motor Activity Tremors  Appearance/Hygiene Improved  Behavior Characteristics Cooperative  Mood Pleasant  Thought Process  Coherency WDL  Content WDL  Delusions None reported or observed  Perception WDL  Hallucination None reported or observed  Judgment Limited  Confusion WDL  Danger to Self  Current suicidal ideation? Denies  Danger to Others  Danger to Others None reported or observed   Pt awake and alert.  He has been visible in milieu and maintaining appropriate boundaries.  Pt denies SI, HI or AVH.   Staff will cont to monitor for safety.

## 2023-06-27 NOTE — Plan of Care (Signed)
  Problem: Education: Goal: Knowledge of [place] [facility] Education information/materials will improve Outcome: Progressing Goal: Emotional status will improve Outcome: Progressing Goal: Mental status will improve Outcome: Progressing Goal: Verbalization of understanding the information provided will improve Outcome: Progressing   Problem: Activity: Goal: Interest or engagement in activities will improve Outcome: Progressing Goal: Sleeping patterns will improve Outcome: Progressing   Problem: Coping: Goal: Ability to verbalize frustrations and anger appropriately will improve Outcome: Progressing Goal: Ability to demonstrate self-control will improve Outcome: Progressing   Problem: Health Behavior/Discharge Planning: Goal: Identification of resources available to assist in meeting health care needs will improve Outcome: Progressing Goal: Compliance with treatment plan for underlying cause of condition will improve Outcome: Progressing   Problem: Physical Regulation: Goal: Ability to maintain clinical measurements within normal limits will improve Outcome: Progressing   Problem: Safety: Goal: Periods of time without injury will increase Outcome: Progressing   Problem: Education: Goal: Ability to make informed decisions regarding treatment will improve Outcome: Progressing   Problem: Coping: Goal: Coping ability will improve Outcome: Progressing   Problem: Health Behavior/Discharge Planning: Goal: Identification of resources available to assist in meeting health care needs will improve Outcome: Progressing   Problem: Medication: Goal: Compliance with prescribed medication regimen will improve Outcome: Progressing   Problem: Self-Concept: Goal: Ability to disclose and discuss suicidal ideas will improve Outcome: Progressing   Problem: Activity: Goal: Interest or engagement in activities will improve Outcome: Progressing   Problem: Activity: Goal: Sleeping  patterns will improve Outcome: Progressing   Problem: Coping: Goal: Ability to verbalize frustrations and anger appropriately will improve Outcome: Progressing   Problem: Coping: Goal: Ability to demonstrate self-control will improve Outcome: Progressing   Problem: Physical Regulation: Goal: Ability to maintain clinical measurements within normal limits will improve Outcome: Progressing

## 2023-06-27 NOTE — Progress Notes (Signed)
   06/27/23 2100  Psych Admission Type (Psych Patients Only)  Admission Status Involuntary  Psychosocial Assessment  Patient Complaints Depression  Eye Contact Fair  Facial Expression Animated  Affect Appropriate to circumstance  Speech Soft  Interaction Assertive  Motor Activity Tremors  Appearance/Hygiene Improved  Behavior Characteristics Cooperative  Mood Pleasant  Thought Process  Coherency WDL  Content WDL  Delusions None reported or observed  Perception WDL  Hallucination None reported or observed  Judgment Impaired  Confusion WDL  Danger to Self  Current suicidal ideation? Denies  Danger to Others  Danger to Others None reported or observed

## 2023-06-27 NOTE — Progress Notes (Signed)
   06/27/23 0545  15 Minute Checks  Location Bedroom  Visual Appearance Calm  Behavior Sleeping  Sleep (Behavioral Health Patients Only)  Calculate sleep? (Click Yes once per 24 hr at 0600 safety check) Yes  Documented sleep last 24 hours 9

## 2023-06-27 NOTE — Plan of Care (Signed)
  Problem: Coping: Goal: Coping ability will improve Outcome: Progressing   Problem: Medication: Goal: Compliance with prescribed medication regimen will improve Outcome: Progressing   

## 2023-06-27 NOTE — Group Note (Signed)
Date:  06/27/2023 Time:  11:21 AM  Group Topic/Focus:  Goals Group:   The focus of this group is to help patients establish daily goals to achieve during treatment and discuss how the patient can incorporate goal setting into their daily lives to aide in recovery. Orientation:   The focus of this group is to educate the patient on the purpose and policies of crisis stabilization and provide a format to answer questions about their admission.  The group details unit policies and expectations of patients while admitted.    Participation Level:  Did Not Attend   [name] 06/27/2023, 11:21 AM

## 2023-06-27 NOTE — Group Note (Signed)
Date:  06/27/2023 Time:  8:21 PM  Group Topic/Focus:  Goals Group:   The focus of this group is to help patients establish daily goals to achieve during treatment and discuss how the patient can incorporate goal setting into their daily lives to aide in recovery. Wrap-Up Group:   The focus of this group is to help patients review their daily goal of treatment and discuss progress on daily workbooks.    Participation Level:  Did Not Attend   Additional Comments:   Osker Mason 06/27/2023, 8:21 PM

## 2023-06-28 ENCOUNTER — Encounter (HOSPITAL_COMMUNITY): Payer: Self-pay

## 2023-06-28 NOTE — BH IP Treatment Plan (Signed)
Interdisciplinary Treatment and Diagnostic Plan Update  06/28/2023 Time of Session: 12:10PM - UPDATE Yolanda [name] MRN: [id number]  Principal Diagnosis: Schizoaffective disorder, depressive type (HCC)  Secondary Diagnoses: Principal Problem:   Schizoaffective disorder, depressive type (HCC) Active Problems:   GERD (gastroesophageal reflux disease)   Intellectual disability   Tobacco use disorder   Current Medications:  Current Facility-Administered Medications  Medication Dose Route Frequency Provider Last Rate Last Admin   acetaminophen (TYLENOL) tablet 650 mg  650 mg Oral Q6H PRN [name], NP   650 mg at 06/27/23 1813   alum & mag hydroxide-simeth (MAALOX/MYLANTA) 200-200-20 MG/5ML suspension 30 mL  30 mL Oral Q4H PRN Onuoha, Chinwendu V, NP   30 mL at 05/31/23 0801   busPIRone (BUSPAR) tablet 15 mg  15 mg Oral BID [name], NP   15 mg at 06/28/23 0820   cyanocobalamin (VITAMIN B12) injection 1,000 mcg  1,000 mcg Intramuscular Q30 days Sarita Bottom, MD   1,000 mcg at 06/02/23 1129   diphenhydrAMINE (BENADRYL) capsule 50 mg  50 mg Oral TID PRN [name], NP   50 mg at 06/03/23 1110   Or   diphenhydrAMINE (BENADRYL) injection 50 mg  50 mg Intramuscular TID PRN [name], NP       docusate sodium (COLACE) capsule 100 mg  100 mg Oral Daily [name], NP   100 mg at 06/28/23 9528   haloperidol (HALDOL) tablet 5 mg  5 mg Oral TID PRN [name], NP   5 mg at 06/03/23 1110   Or   haloperidol lactate (HALDOL) injection 5 mg  5 mg Intramuscular TID PRN [name], NP       hydrocortisone cream 1 %   Topical BID Cecilie Lowers, FNP   1 Application at 06/27/23 1732   hydrOXYzine (ATARAX) tablet 25 mg  25 mg Oral TID PRN [name], DO   25 mg at 05/27/23 1146   LORazepam (ATIVAN) tablet 2 mg  2 mg Oral TID PRN [name], NP   2 mg at 06/03/23 1110   Or   LORazepam (ATIVAN) injection 2 mg  2 mg Intramuscular TID PRN [name], NP       magnesium  hydroxide (MILK OF MAGNESIA) suspension 30 mL  30 mL Oral Daily PRN [name], NP   30 mL at 06/23/23 1856   melatonin tablet 5 mg  5 mg Oral QHS Nkwenti, Doris, NP   5 mg at 06/27/23 2100   nicotine polacrilex (NICORETTE) gum 2 mg  2 mg Oral PRN [name], MD   2 mg at 06/28/23 4132   paliperidone (INVEGA) 24 hr tablet 6 mg  6 mg Oral Daily [name], NP   6 mg at 06/27/23 2059   pantoprazole (PROTONIX) EC tablet 40 mg  40 mg Oral Daily Nwoko, Nicole Kindred I, NP   40 mg at 06/28/23 0819   polyethylene glycol (MIRALAX / GLYCOLAX) packet 17 g  17 g Oral Daily PRN [name], MD   17 g at 06/07/23 1847   polyethylene glycol (MIRALAX / GLYCOLAX) packet 17 g  17 g Oral Daily [name], MD   17 g at 06/27/23 0839   sertraline (ZOLOFT) tablet 150 mg  150 mg Oral Daily Massengill, Harrold Donath, MD   150 mg at 06/28/23 0819   SUMAtriptan (IMITREX) tablet 25 mg  25 mg Oral BID PRN [name], NP   25 mg at 05/07/23 2112   traZODone (DESYREL)  tablet 50 mg  50 mg Oral QHS [name], NP   50 mg at 06/27/23 2100   Vitamin D (Ergocalciferol) (DRISDOL) 1.25 MG (50000 UNIT) capsule 50,000 Units  50,000 Units Oral Q7 days [name], NP   50,000 Units at 06/22/23 1524   PTA Medications: Medications Prior to Admission  Medication Sig Dispense Refill Last Dose/Taking   busPIRone (BUSPAR) 15 MG tablet Take 15 mg by mouth 2 (two) times daily. (Patient not taking: Reported on 12/19/2022)      paliperidone (INVEGA SUSTENNA) 156 MG/ML SUSY injection Inject 156 mg into the muscle once. (Patient not taking: Reported on 12/19/2022)      sertraline (ZOLOFT) 50 MG tablet Take 150 mg by mouth daily. (Patient not taking: Reported on 12/19/2022)      traZODone (DESYREL) 100 MG tablet Take 100 mg by mouth at bedtime as needed for sleep. (Patient not taking: Reported on 11/12/2022)       Patient Stressors: Medication change or noncompliance    Patient Strengths: Forensic psychologist fund of  knowledge   Treatment Modalities: Medication Management, Group therapy, Case management,  1 to 1 session with clinician, Psychoeducation, Recreational therapy.   Physician Treatment Plan for Primary Diagnosis: Schizoaffective disorder, depressive type (HCC) Long Term Goal(s): Improvement in symptoms so as ready for discharge   Short Term Goals: Ability to identify changes in lifestyle to reduce recurrence of condition will improve Ability to verbalize feelings will improve  Medication Management: Evaluate patient's response, side effects, and tolerance of medication regimen.  Therapeutic Interventions: 1 to 1 sessions, Unit Group sessions and Medication administration.  Evaluation of Outcomes: Progressing  Physician Treatment Plan for Secondary Diagnosis: Principal Problem:   Schizoaffective disorder, depressive type (HCC) Active Problems:   GERD (gastroesophageal reflux disease)   Intellectual disability   Tobacco use disorder  Long Term Goal(s): Improvement in symptoms so as ready for discharge   Short Term Goals: Ability to identify changes in lifestyle to reduce recurrence of condition will improve Ability to verbalize feelings will improve     Medication Management: Evaluate patient's response, side effects, and tolerance of medication regimen.  Therapeutic Interventions: 1 to 1 sessions, Unit Group sessions and Medication administration.  Evaluation of Outcomes: Progressing   RN Treatment Plan for Primary Diagnosis: Schizoaffective disorder, depressive type (HCC) Long Term Goal(s): Knowledge of disease and therapeutic regimen to maintain health will improve  Short Term Goals: Ability to remain free from injury will improve, Ability to participate in decision making will improve, Ability to verbalize feelings will improve, and Compliance with prescribed medications will improve  Medication Management: RN will administer medications as ordered by provider, will assess  and evaluate patient's response and provide education to patient for prescribed medication. RN will report any adverse and/or side effects to prescribing provider.  Therapeutic Interventions: 1 on 1 counseling sessions, Psychoeducation, Medication administration, Evaluate responses to treatment, Monitor vital signs and CBGs as ordered, Perform/monitor CIWA, COWS, AIMS and Fall Risk screenings as ordered, Perform wound care treatments as ordered.  Evaluation of Outcomes: Progressing   LCSW Treatment Plan for Primary Diagnosis: Schizoaffective disorder, depressive type (HCC) Long Term Goal(s): Safe transition to appropriate next level of care at discharge, Engage patient in therapeutic group addressing interpersonal concerns.  Short Term Goals: Engage patient in aftercare planning with referrals and resources, Increase ability to appropriately verbalize feelings, Facilitate acceptance of mental health diagnosis and concerns, and Facilitate patient progression through stages of change regarding substance use diagnoses and concerns  Therapeutic Interventions: Assess for all discharge needs, 1 to 1 time with Child psychotherapist, Explore available resources and support systems, Assess for adequacy in community support network, Educate family and significant other(s) on suicide prevention, Complete Psychosocial Assessment, Interpersonal group therapy.  Evaluation of Outcomes: Progressing   Progress in Treatment: Attending groups: Yes. Participating in groups: Yes. Taking medication as prescribed: Yes. Toleration medication: Yes. Family/Significant other contact made: Yes, individual(s) contacted:  Larita Bowens [phone] Patient understands diagnosis: Yes. Discussing patient identified problems/goals with staff: Yes. Medical problems stabilized or resolved: Yes. Denies suicidal/homicidal ideation: Yes. Issues/concerns per patient self-inventory: No. Other: none reported   New problem(s) identified:  none reported   New Short Term/Long Term Goal(s): medication stabilization, elimination of SI thoughts, development of comprehensive mental wellness plan.      Patient Goals:  " placement"   Discharge Plan or Barriers: Patient recently admitted. CSW will continue to follow and assess for appropriate referrals and possible discharge planning.      Reason for Continuation of Hospitalization: Depression Suicidal ideation   Estimated Length of Stay: Pt still expected to discharge  to group home awaiting  Single Case agreement from [facility].  Last 3 [place] Suicide Severity Risk Score: Flowsheet Row Admission (Current) [facility] from 12/19/2022 in [facility] from 11/12/2022 in [facility]  C-SSRS RISK CATEGORY High Risk High Risk Moderate Risk       Last [facility] 2/9 Scores:     No data to display          Scribe for Treatment Team: Kathi Der, Theresia Majors 06/28/2023 12:11 PM

## 2023-06-28 NOTE — Progress Notes (Signed)
    06/28/23 0800  Psych Admission Type (Psych Patients Only)  Admission Status Involuntary  Psychosocial Assessment  Patient Complaints None  Eye Contact Fair  Facial Expression Animated  Affect Appropriate to circumstance  Speech Soft  Interaction Assertive  Motor Activity Tremors  Appearance/Hygiene Improved  Behavior Characteristics Cooperative;Appropriate to situation  Mood Pleasant  Thought Process  Coherency WDL  Content WDL  Delusions None reported or observed  Perception WDL  Hallucination None reported or observed  Judgment Impaired  Confusion WDL  Danger to Self  Current suicidal ideation? Denies  Danger to Others  Danger to Others None reported or observed   Patient alert and oriented. Patient denies SI, HI, AVH, and pain.  Scheduled medications administered to patient, per provider orders. Support and encouragement provided. Routine safety checks conducted every 15 minutes. Patient verbally contracts for safety and remains safe on the unit.

## 2023-06-28 NOTE — Progress Notes (Addendum)
[facility] MD Progress Note  06/28/2023 2:32 PM Yolanda [name]  MRN:  [id number]  Reason for admission:  This is the first psychiatric admission in this [facility] in 12 years for this AA female with an extensive hx of mental illnesses & probable polysubstance use disorders. She is admitted to the [facility] from the [facility] hospital with complain of worsening suicidal ideations with plan to stab herself. Per chart review, patient apparently reported at the ED that she has been depressed for a while & has not been taking her mental health medications. After medical evaluation.clearance, she was transferred to the [facility] for further psychiatric evaluation/treatments. During this evaluation, Yolanda [name] presents irritated, labile, angry & tearful. She is not forth-coming with the information needed to help manage her care.  She denies delusional thinking or paranoia.  Chart Review from last 24 hours:  The patient's chart was reviewed and nursing notes were reviewed. The patient's case was discussed in multidisciplinary team meeting. Documented sleep hours per nursing staff: 8.0. Vital signs within normal limits. She received as needed Tylenol last evening. No reported behavioral episodes overnight.  Information Obtained Today During Patient Interview: Upon evaluation today, the patient was observed lying in bed.  She awakens easily and describes her mood as "okay," denying any suicidal or homicidal ideation. She also denies experiencing paranoia, delusions, or auditory or visual hallucinations.  When asked why she remained in the bed at this time a day, the patient reported feeling tired, attributes it to a lack of sleep the previous night. She stated she has difficulty staying asleep but denies experiencing night terrors. Documented sleep hours per nursing staff: 8.0. She rates her anxiety as 4/10 and her depression as 5/10 on a 10 point scale. When asked about her reported absence from group therapy sessions in the milieu,  she stated, "I know all the groups."  The patient reports she is compliant with her routine medication regimen and denies experiencing any medication related side effects. The patient was encouraged to participate in group therapy sessions and engage in the milieu. No changes will be made to her current treatment plan at this time.  She remains unable to live independently due to intellectual disability and chronic mental health conditions.  Discharge remains pending.   Principal Problem: Schizoaffective disorder, depressive type (HCC) Diagnosis: Principal Problem:   Schizoaffective disorder, depressive type (HCC) Active Problems:   GERD (gastroesophageal reflux disease)   Intellectual disability   Tobacco use disorder  Total Time spent with patient: 35 minutes  Past Psychiatric History: Previous Psychotropic Medications: Mirtazapine, Invega sustenna, Gabapentin, Cogentin, Buspar, Naltrexone, Abilify.   Past Medical History:  Past Medical History:  Diagnosis Date   Bipolar affect, depressed (HCC)    Constipation 08/17/2022   Depression    Falls 07/21/2022   Fracture of femoral neck, right, closed (HCC) 01/17/2022   Herpes simplex 08/22/2017   Open fracture dislocation of right elbow joint 01/17/2022    Past Surgical History:  Procedure Laterality Date   NO PAST SURGERIES     SALPINGECTOMY     Family History: History reviewed. No pertinent family history. Family Psychiatric  History:  Patient reports that everyone in her family has bipolar disorder.  Social History:  Social History   Substance and Sexual Activity  Alcohol Use Yes     Social History   Substance and Sexual Activity  Drug Use Yes   Types: Cocaine, Marijuana    Social History   Socioeconomic History   Marital status: Single  Spouse name: Not on file   Number of children: Not on file   Years of education: Not on file   Highest education level: Not on file  Occupational History   Not on file  Tobacco  Use   Smoking status: Every Day   Smokeless tobacco: Not on file  Substance and Sexual Activity   Alcohol use: Yes   Drug use: Yes    Types: Cocaine, Marijuana   Sexual activity: Yes  Other Topics Concern   Not on file  Social History Narrative   Not on file   Social Drivers of Health   Financial Resource Strain: Low Risk  (09/12/2022)   Received from [facility], [facility]   Overall Financial Resource Strain (CARDIA)    Difficulty of Paying Living Expenses: Not hard at all  Food Insecurity: Patient Declined (12/20/2022)   Hunger Vital Sign    Worried About Running Out of Food in the Last Year: Patient declined    Ran Out of Food in the Last Year: Patient declined  Transportation Needs: No Transportation Needs (12/20/2022)   PRAPARE - Administrator, Civil Service (Medical): No    Lack of Transportation (Non-Medical): No  Physical Activity: Not on file  Stress: No Stress Concern Present (07/17/2022)   Received from [facility], [facility] of Occupational [facility] - Occupational Stress Questionnaire    Feeling of Stress : Not at all  Social Connections: Unknown (07/16/2022)   Received from [facility], [facility]   Social Network    Social Network: Not on file   Additional Social History:    Sleep: Good  Appetite:  Fair  Current Medications: Current Facility-Administered Medications  Medication Dose Route Frequency Provider Last Rate Last Admin   acetaminophen (TYLENOL) tablet 650 mg  650 mg Oral Q6H PRN [name], NP   650 mg at 06/27/23 1813   alum & mag hydroxide-simeth (MAALOX/MYLANTA) 200-200-20 MG/5ML suspension 30 mL  30 mL Oral Q4H PRN Onuoha, Chinwendu V, NP   30 mL at 05/31/23 0801   busPIRone (BUSPAR) tablet 15 mg  15 mg Oral BID [name], NP   15 mg at 06/28/23 0820   cyanocobalamin (VITAMIN B12) injection 1,000 mcg  1,000 mcg Intramuscular Q30 days [name], Nadir, MD   1,000 mcg at 06/02/23 1129    diphenhydrAMINE (BENADRYL) capsule 50 mg  50 mg Oral TID PRN [name], NP   50 mg at 06/03/23 1110   Or   diphenhydrAMINE (BENADRYL) injection 50 mg  50 mg Intramuscular TID PRN [name], NP       docusate sodium (COLACE) capsule 100 mg  100 mg Oral Daily Nkwenti, Tyler Aas, NP   100 mg at 06/28/23 6045   haloperidol (HALDOL) tablet 5 mg  5 mg Oral TID PRN [name], NP   5 mg at 06/03/23 1110   Or   haloperidol lactate (HALDOL) injection 5 mg  5 mg Intramuscular TID PRN [name], NP       hydrocortisone cream 1 %   Topical BID Cecilie Lowers, FNP   1 Application at 06/27/23 1732   hydrOXYzine (ATARAX) tablet 25 mg  25 mg Oral TID PRN [name], DO   25 mg at 05/27/23 1146   LORazepam (ATIVAN) tablet 2 mg  2 mg Oral TID PRN [name], NP   2 mg at 06/03/23 1110   Or   LORazepam (ATIVAN) injection 2 mg  2 mg Intramuscular TID PRN [name], NP       magnesium hydroxide (MILK OF MAGNESIA) suspension 30 mL  30 mL Oral Daily PRN [name], NP   30 mL at 06/23/23 1856   melatonin tablet 5 mg  5 mg Oral QHS Nkwenti, Doris, NP   5 mg at 06/27/23 2100   nicotine polacrilex (NICORETTE) gum 2 mg  2 mg Oral PRN [name], MD   2 mg at 06/28/23 1610   paliperidone (INVEGA) 24 hr tablet 6 mg  6 mg Oral Daily [name], NP   6 mg at 06/27/23 2059   pantoprazole (PROTONIX) EC tablet 40 mg  40 mg Oral Daily Nwoko, Nicole Kindred I, NP   40 mg at 06/28/23 0819   polyethylene glycol (MIRALAX / GLYCOLAX) packet 17 g  17 g Oral Daily PRN [name], MD   17 g at 06/07/23 1847   polyethylene glycol (MIRALAX / GLYCOLAX) packet 17 g  17 g Oral Daily [name], MD   17 g at 06/27/23 0839   sertraline (ZOLOFT) tablet 150 mg  150 mg Oral Daily Massengill, Harrold Donath, MD   150 mg at 06/28/23 9604   SUMAtriptan (IMITREX) tablet 25 mg  25 mg Oral BID PRN [name], NP   25 mg at 05/07/23 2112   traZODone (DESYREL) tablet 50 mg  50 mg Oral QHS [name], NP   50 mg at  06/27/23 2100   Vitamin D (Ergocalciferol) (DRISDOL) 1.25 MG (50000 UNIT) capsule 50,000 Units  50,000 Units Oral Q7 days [name], NP   50,000 Units at 06/22/23 1524   Lab Results:  No results found for this or any previous visit (from the past 48 hours).  Blood Alcohol level:  Lab Results  Component Value Date   ETH <10 12/19/2022   ETH <10 11/21/2019   Metabolic Disorder Labs: Lab Results  Component Value Date   HGBA1C 5.6 06/16/2023   MPG 114.02 06/16/2023   MPG 79.58 12/21/2022   No results found for: "PROLACTIN" Lab Results  Component Value Date   CHOL 256 (H) 06/16/2023   TRIG 224 (H) 06/16/2023   HDL 83 06/16/2023   CHOLHDL 3.1 06/16/2023   VLDL 45 (H) 06/16/2023   LDLCALC 128 (H) 06/16/2023   LDLCALC 149 (H) 12/21/2022   Physical Findings: AIMS: Facial and Oral Movements Muscles of Facial Expression: None Lips and Perioral Area: None Jaw: None Tongue: None,Extremity Movements Upper (arms, wrists, hands, fingers): None Lower (legs, knees, ankles, toes): None, Trunk Movements Neck, shoulders, hips: None, Global Judgements Severity of abnormal movements overall : None Incapacitation due to abnormal movements: None Patient's awareness of abnormal movements: No Awareness, Dental Status Current problems with teeth and/or dentures?: No Does patient usually wear dentures?: No  CIWA:    COWS:     Musculoskeletal: Strength & Muscle Tone: within normal limits Gait & Station: normal Patient leans: N/A  Psychiatric Specialty Exam:  Presentation  General Appearance:  Appropriate for Environment; Fairly Groomed  Eye Contact: Fair  Speech: Clear and Coherent  Speech Volume: Normal  Handedness: Right  Mood and Affect  Mood: Euthymic  Affect: Appropriate  Thought Process  Thought Processes: Coherent  Descriptions of Associations:Intact  Orientation:Full (Time, Place and Person)  Thought Content:Logical  History of  Schizophrenia/Schizoaffective disorder:Yes  Duration of Psychotic Symptoms:Greater than six months  Hallucinations:Hallucinations: None Description of Auditory Hallucinations: Denies  Ideas of Reference:None  Suicidal Thoughts:Suicidal Thoughts: No SI Active Intent and/or Plan: -- (Denies)  SI Passive Intent and/or Plan: -- (Denies)  Homicidal Thoughts:Homicidal Thoughts: No  Sensorium  Memory: Immediate Fair; Recent Fair  Judgment: Fair  Insight: Fair  Chartered certified accountant: Fair  Attention Span: Fair  Recall: Fair  Fund of Knowledge: Fair  Language: Fair  Psychomotor Activity  Psychomotor Activity:Psychomotor Activity: Normal  Assets  Assets: Manufacturing systems engineer; Desire for Improvement; Physical Health; Resilience  Sleep  Sleep:Sleep: Good Number of Hours of Sleep: 9  Physical Exam: Physical Exam Vitals and nursing note reviewed.  Constitutional:      Appearance: Normal appearance.  HENT:     Head: Normocephalic and atraumatic.     Mouth/Throat:     Mouth: Mucous membranes are moist.     Pharynx: Oropharynx is clear.  Eyes:     Extraocular Movements: Extraocular movements intact.  Cardiovascular:     Rate and Rhythm: Normal rate.     Pulses: Normal pulses.  Pulmonary:     Effort: Pulmonary effort is normal.  Abdominal:     Comments: Deferred  Genitourinary:    Comments: Deferred Musculoskeletal:        General: Normal range of motion.     Cervical back: Normal range of motion.  Skin:    General: Skin is warm.  Neurological:     General: No focal deficit present.     Mental Status: She is alert and oriented to person, place, and time.  Psychiatric:        Mood and Affect: Mood normal.        Behavior: Behavior normal.    Review of Systems  Constitutional:  Negative for chills and fever.  HENT:  Negative for sore throat.   Eyes:  Negative for blurred vision.  Respiratory:  Negative for cough, sputum production,  shortness of breath and wheezing.   Cardiovascular:  Negative for chest pain and palpitations.  Gastrointestinal:  Negative for constipation, diarrhea, heartburn, nausea and vomiting.  Genitourinary:  Negative for dysuria, frequency and urgency.  Musculoskeletal:  Negative for myalgias.  Skin:  Negative for itching and rash.  Neurological:  Negative for headaches.  Endo/Heme/Allergies:        See allergy listing  Psychiatric/Behavioral:  Negative for depression, hallucinations and suicidal ideas.    Blood pressure 94/62, pulse 61, temperature 98.4 F (36.9 C), temperature source Oral, resp. rate 12, height 4\' 11"  (1.499 m), weight 63 kg, SpO2 98%. Body mass index is 28.05 kg/m.  Treatment Plan Summary: Daily contact with patient to assess and evaluate symptoms and progress in treatment and Medication management   ASSESSMENT: Starkeisha D Casaletto is a 47 y.o. female  with a past psychiatric history of schizoaffective disorder, bipolar type, multiple prior suicide attempts, cognitive delay on disability, and a history of medication nonadherence.  Admitted to [facility] from [facility] with complaints of worsening suicidal ideations with plan to stab herself.   Diagnoses / Active Problems: Schizoaffective disorder bipolar type   PLAN: Safety and Monitoring:             --  INVOLUNTARY admission to inpatient psychiatric unit for safety, stabilization and treatment             -- Daily contact with patient to assess and evaluate symptoms and progress in treatment             -- Patient's case to be discussed in multi-disciplinary team meeting             -- Observation Level : q15 minute  checks             -- Vital signs:  q12 hours             -- Precautions: suicide, elopement, and assault   2. Treatment plan. -Trazodone 50 mg nightly for insomnia - Melatonin 5 mg nightly for insomnia - Zoloft 150 mg daily for depression - Paliperidone 6 mg daily for psychosis - BuSpar 15 mg twice  daily for anxiety.  -- The risks/benefits/side-effects/alternatives to this medication were discussed in detail with the patient and time was given for questions. The patient consents to medication trial.              -- Metabolic profile and EKG monitoring obtained while on an atypical antipsychotic.  Other PRNS:  acetaminophen, alum & mag hydroxide-simeth, diphenhydrAMINE **OR** diphenhydrAMINE, haloperidol **OR** haloperidol lactate, hydrOXYzine, LORazepam **OR** LORazepam, magnesium hydroxide, nicotine polacrilex, polyethylene glycol, SUMAtriptan               3. Medical Issues Being Addressed:  Other medications include: scheduled Colace, hydrocortisone cream, Protonix 40 mg for GERD   # Vitamin D deficiency Vitamin D capsule 50,000 units q. 7 days   4. Discharge Planning:              -- Social work and case management to assist with discharge planning and identification of hospital follow-up needs prior to discharge             -- Estimated Discharge Date:               -- Apparently patient now has a legal guardian. Placement is in progress. The patient may not have the option to go to the mother's house unless there is 24/7 supervision.  06/13/2023: LCSW, patient, and Guardian Ad Litem had a meeting yesterday regarding patient discharge 06/12/2023.  Please see social worker's notes. May have placement for next week at a group home.               -- Discharge Concerns: Need to establish a safety plan; Medication compliance and effectiveness             -- Discharge Goals: Return home with outpatient referrals for mental health follow-up including medication management/psychotherapy    I certify that inpatient services furnished can reasonably be expected to improve the patient's condition.    Norma Fredrickson, NP 06/28/2023, 2:32 PMPatient ID: Larey Brick, female   DOB: 11-29-1975, 47 y.o.   MRN: [id number] Patient ID: PANG GODINHO, female   DOB: 03-10-1976, 47  y.o.   MRN: [id number] Patient ID: TAJAHNAE REISDORF, female   DOB: December 08, 1975, 47 y.o.   MRN: [id number] Patient ID: HARNEET SODERQUIST, female   DOB: 1975/11/02, 47 y.o.   MRN: [id number] Patient ID: DIMONIQUE BERING, female   DOB: January 02, 1976, 47 y.o.   MRN: [id number]

## 2023-06-28 NOTE — Progress Notes (Signed)
   06/28/23 0545  15 Minute Checks  Location Bedroom  Visual Appearance Calm  Behavior Sleeping  Sleep (Behavioral Health Patients Only)  Calculate sleep? (Click Yes once per 24 hr at 0600 safety check) Yes  Documented sleep last 24 hours 8

## 2023-06-28 NOTE — Group Note (Signed)
Date:  06/28/2023 Time:  9:12 AM  Group Topic/Focus:  Goals Group:   The focus of this group is to help patients establish daily goals to achieve during treatment and discuss how the patient can incorporate goal setting into their daily lives to aide in recovery. Orientation:   The focus of this group is to educate the patient on the purpose and policies of crisis stabilization and provide a format to answer questions about their admission.  The group details unit policies and expectations of patients while admitted.    Participation Level:  Did Not Attend  Participation Quality:    Affect:    Cognitive:    Insight:   Engagement in Group:    Modes of Intervention:    Additional Comments:    Jaquita Rector 06/28/2023, 9:12 AM

## 2023-06-28 NOTE — Progress Notes (Signed)
NAME: Yolanda [name]   DOB: January 28, 1976  MID: [id number] Judie Petit   Email from Rise Paganini, Baldo Daub, Regional Care Manager  Good morning, I spoke with group home owner, [name], who confirmed she submitted the member's requests with the additional documentation requested this morning (06/28/23) . If UM informs me of an update, I will make the Care Team aware.   CSW will continue to follow and update team.

## 2023-06-28 NOTE — Progress Notes (Signed)
Patient stated that she was having passive SI, patient was able to contract for safety. Thurston Hole, NP notified. Patient was redirectable and talked to this RN and was provided with an alternative activity of arts/crafts to help manage feelings of sadness and SI.

## 2023-06-28 NOTE — Group Note (Signed)
Date:  06/28/2023 Time:  11:31 AM  Group Topic/Focus:  Making Healthy Choices:   The focus of this group is to help patients identify negative/unhealthy choices they were using prior to admission and identify positive/healthier coping strategies to replace them upon discharge.    Participation Level:  Did Not Attend  Participation Quality:    Affect:    Cognitive:    Insight:   Engagement in Group:    Modes of Intervention:   Additional Comments:    Jaquita Rector 06/28/2023, 11:31 AM

## 2023-06-28 NOTE — Plan of Care (Signed)
  Problem: Education: Goal: Ability to make informed decisions regarding treatment will improve Outcome: Progressing   Problem: Coping: Goal: Coping ability will improve Outcome: Progressing   Problem: Self-Concept: Goal: Will verbalize positive feelings about self Outcome: Progressing Note: Patient is on track. Patient will maintain adherence    Problem: Self-Concept: Goal: Ability to disclose and discuss suicidal ideas will improve Outcome: Progressing

## 2023-06-28 NOTE — Discharge Planning (Signed)
LCSW went and spoke with patient on yesterday 06/27/2023 while on unit to provide update. Patient was informed that team is still awaiting additional paperwork to be processed for the patient to be admitted into the Group Home. Patient made aware of Group Homes interest in still accepting her, however was informed that at this time we cannot put an exact date on when she will discharge. Patient aware that team is working diligently to get this process moving forward and patient expressed appreciation. No other needs were reported by the patient during time of visit. Patient aware that LCSW will follow up to provide updates as received.   LCSW and Care Manager Baldo Daub spoke regarding current status of application. Per Jolan, Group Home has submitted the additional requested information that [facility] needed for processing. Team will follow up on Monday regarding updates and will provide them as received.   Fernande Boyden, LCSW Clinical Social Worker [facility] Ph: [phone]

## 2023-06-28 NOTE — [facility] Group Notes (Signed)
Psychoeducational Group Note  Date:  06/28/2023 Time:  2000  Group Topic/Focus:  Alcoholic Anonymous Meeting  Participation Level: Did Not Attend  Participation Quality:  Not Applicable  Affect:  Not Applicable  Cognitive:  Not Applicable  Insight:  Not Applicable  Engagement in Group: Not Applicable  Additional Comments:  Did not attend  [name] 06/28/2023, 9:25 PM

## 2023-06-28 NOTE — Group Note (Signed)
Recreation Therapy Group Note   Group Topic:Problem Solving  Group Date: 06/28/2023 Start Time: 0935 End Time: 1000 Facilitators: Celina Shiley-McCall, LRT,CTRS Location: [address] Dayroom   Group Topic: Communication, Team Building, Problem Solving   Goal Area(s) Addresses:  Patient will effectively work with peer towards shared goal.  Patient will identify skills used to make activity successful.  Patient will identify how skills used during activity can be used to reach post d/c goals.    Intervention: STEM Activity   Group Description: Straw Bridge. In teams of 3-5, patients were given 15 plastic drinking straws and an equal length of masking tape. Using the materials provided, patients were instructed to build a free standing bridge-like structure to suspend an everyday item (ex: puzzle box) off of the floor or table surface. All materials were required to be used by the team in their design. LRT facilitated post-activity discussion reviewing team process. Patients were encouraged to reflect how the skills used in this activity can be generalized to daily life post discharge.    Education: Pharmacist, community, Scientist, physiological, Discharge Planning    Education Outcome: Acknowledges education/In group clarification offered/Needs additional education.    Affect/Mood: N/A   Participation Level: Did not attend    Clinical Observations/Individualized Feedback:    Plan: Continue to engage patient in RT group sessions 2-3x/week.   Kendy Haston-McCall, LRT,CTRS 06/28/2023 12:18 PM

## 2023-06-29 NOTE — Progress Notes (Signed)
[name] MD Progress Note  06/29/2023 2:11 PM Yolanda [name]  MRN:  [id number]  Reason for admission:  This is the first psychiatric admission in this [facility] in 12 years for this AA female with an extensive hx of mental illnesses & probable polysubstance use disorders. She is admitted to the [facility] from the [facility] hospital with complain of worsening suicidal ideations with plan to stab herself. Per chart review, patient apparently reported at the ED that she has been depressed for a while & has not been taking her mental health medications. After medical evaluation.clearance, she was transferred to the [facility] for further psychiatric evaluation/treatments. During this evaluation, Yolanda [name] presents irritated, labile, angry & tearful. She is not forth-coming with the information needed to help manage her care.  She denies delusional thinking or paranoia.  Chart Review from last 24 hours:  The patient's chart was reviewed and nursing notes were reviewed. The patient's case was discussed in multidisciplinary team meeting. Vital signs within normal limits. She received as needed Tylenol last evening. No reported behavioral episodes overnight.  Information Obtained Today During Patient Interview: Upon evaluation today, the patient was observed lying in bed.  She awakens easily and describes her mood as "okay," denying any suicidal or homicidal ideation. Patient denies current AVH but reports that she took the tylenol last night because she was haring voices that she could not understand. Patient reports that she wants to leave, but is not bored because she keeps herself occupied with coloring, studying, and word searches during the day. Patient reports that she has been hearing things for some time and does not endorse why she has been denying AH daily. She does reports that AH comes and goes and she would like for it to stop completely. Patient endorses that otherwise she is eating well and will leave her room later  this AM.   Principal Problem: Schizoaffective disorder, depressive type (HCC) Diagnosis: Principal Problem:   Schizoaffective disorder, depressive type (HCC) Active Problems:   GERD (gastroesophageal reflux disease)   Intellectual disability   Tobacco use disorder  Total Time spent with patient: 35 minutes  Past Psychiatric History: Previous Psychotropic Medications: Mirtazapine, Invega sustenna, Gabapentin, Cogentin, Buspar, Naltrexone, Abilify.   Past Medical History:  Past Medical History:  Diagnosis Date   Bipolar affect, depressed (HCC)    Constipation 08/17/2022   Depression    Falls 07/21/2022   Fracture of femoral neck, right, closed (HCC) 01/17/2022   Herpes simplex 08/22/2017   Open fracture dislocation of right elbow joint 01/17/2022    Past Surgical History:  Procedure Laterality Date   NO PAST SURGERIES     SALPINGECTOMY     Family History: History reviewed. No pertinent family history. Family Psychiatric  History:  Patient reports that everyone in her family has bipolar disorder.  Social History:  Social History   Substance and Sexual Activity  Alcohol Use Yes     Social History   Substance and Sexual Activity  Drug Use Yes   Types: Cocaine, Marijuana    Social History   Socioeconomic History   Marital status: Single    Spouse name: Not on file   Number of children: Not on file   Years of education: Not on file   Highest education level: Not on file  Occupational History   Not on file  Tobacco Use   Smoking status: Every Day   Smokeless tobacco: Not on file  Substance and Sexual Activity   Alcohol use: Yes  Drug use: Yes    Types: Cocaine, Marijuana   Sexual activity: Yes  Other Topics Concern   Not on file  Social History Narrative   Not on file   Social Drivers of Health   Financial Resource Strain: Low Risk  (09/12/2022)   Received from [facility], [facility]   Overall Financial Resource Strain (CARDIA)    Difficulty of  Paying Living Expenses: Not hard at all  Food Insecurity: Patient Declined (12/20/2022)   Hunger Vital Sign    Worried About Running Out of Food in the Last Year: Patient declined    Ran Out of Food in the Last Year: Patient declined  Transportation Needs: No Transportation Needs (12/20/2022)   PRAPARE - Administrator, Civil Service (Medical): No    Lack of Transportation (Non-Medical): No  Physical Activity: Not on file  Stress: No Stress Concern Present (07/17/2022)   Received from [facility], [facility] of Occupational Health - Occupational Stress Questionnaire    Feeling of Stress : Not at all  Social Connections: Unknown (07/16/2022)   Received from [facility], [facility]   Social Network    Social Network: Not on file   Additional Social History:    Sleep: Good  Appetite:  Fair  Current Medications: Current Facility-Administered Medications  Medication Dose Route Frequency Provider Last Rate Last Admin   acetaminophen (TYLENOL) tablet 650 mg  650 mg Oral Q6H PRN [name], NP   650 mg at 06/28/23 2100   alum & mag hydroxide-simeth (MAALOX/MYLANTA) 200-200-20 MG/5ML suspension 30 mL  30 mL Oral Q4H PRN Onuoha, Chinwendu V, NP   30 mL at 05/31/23 0801   busPIRone (BUSPAR) tablet 15 mg  15 mg Oral BID [name], NP   15 mg at 06/29/23 9147   cyanocobalamin (VITAMIN B12) injection 1,000 mcg  1,000 mcg Intramuscular Q30 days Sarita Bottom, MD   1,000 mcg at 06/02/23 1129   diphenhydrAMINE (BENADRYL) capsule 50 mg  50 mg Oral TID PRN [name], NP   50 mg at 06/03/23 1110   Or   diphenhydrAMINE (BENADRYL) injection 50 mg  50 mg Intramuscular TID PRN [name], NP       docusate sodium (COLACE) capsule 100 mg  100 mg Oral Daily Nkwenti, Doris, NP   100 mg at 06/29/23 0809   haloperidol (HALDOL) tablet 5 mg  5 mg Oral TID PRN [name], NP   5 mg at 06/03/23 1110   Or   haloperidol lactate (HALDOL) injection 5 mg  5 mg  Intramuscular TID PRN [name], NP       hydrocortisone cream 1 %   Topical BID Cecilie Lowers, FNP   1 Application at 06/29/23 8295   hydrOXYzine (ATARAX) tablet 25 mg  25 mg Oral TID PRN [name], DO   25 mg at 05/27/23 1146   LORazepam (ATIVAN) tablet 2 mg  2 mg Oral TID PRN [name], NP   2 mg at 06/03/23 1110   Or   LORazepam (ATIVAN) injection 2 mg  2 mg Intramuscular TID PRN [name], NP       magnesium hydroxide (MILK OF MAGNESIA) suspension 30 mL  30 mL Oral Daily PRN [name], NP   30 mL at 06/23/23 1856   melatonin tablet 5 mg  5 mg Oral QHS Nkwenti, Doris, NP   5 mg at 06/28/23 2100   nicotine polacrilex (NICORETTE) gum  2 mg  2 mg Oral PRN [name], MD   2 mg at 06/28/23 1503   paliperidone (INVEGA) 24 hr tablet 6 mg  6 mg Oral Daily Nkwenti, Doris, NP   6 mg at 06/28/23 2102   pantoprazole (PROTONIX) EC tablet 40 mg  40 mg Oral Daily Nwoko, Nicole Kindred I, NP   40 mg at 06/29/23 0807   polyethylene glycol (MIRALAX / GLYCOLAX) packet 17 g  17 g Oral Daily PRN [name], MD   17 g at 06/07/23 1847   polyethylene glycol (MIRALAX / GLYCOLAX) packet 17 g  17 g Oral Daily [name], MD   17 g at 06/29/23 0810   sertraline (ZOLOFT) tablet 150 mg  150 mg Oral Daily Massengill, Harrold Donath, MD   150 mg at 06/29/23 0809   SUMAtriptan (IMITREX) tablet 25 mg  25 mg Oral BID PRN [name], NP   25 mg at 05/07/23 2112   traZODone (DESYREL) tablet 50 mg  50 mg Oral QHS Nkwenti, Tyler Aas, NP   50 mg at 06/28/23 2100   Vitamin D (Ergocalciferol) (DRISDOL) 1.25 MG (50000 UNIT) capsule 50,000 Units  50,000 Units Oral Q7 days [name], NP   50,000 Units at 06/22/23 1524   Lab Results:  No results found for this or any previous visit (from the past 48 hours).  Blood Alcohol level:  Lab Results  Component Value Date   ETH <10 12/19/2022   ETH <10 11/21/2019   Metabolic Disorder Labs: Lab Results  Component Value Date   HGBA1C 5.6 06/16/2023   MPG  114.02 06/16/2023   MPG 79.58 12/21/2022   No results found for: "PROLACTIN" Lab Results  Component Value Date   CHOL 256 (H) 06/16/2023   TRIG 224 (H) 06/16/2023   HDL 83 06/16/2023   CHOLHDL 3.1 06/16/2023   VLDL 45 (H) 06/16/2023   LDLCALC 128 (H) 06/16/2023   LDLCALC 149 (H) 12/21/2022   Physical Findings: AIMS: Facial and Oral Movements Muscles of Facial Expression: None Lips and Perioral Area: None Jaw: None Tongue: None,Extremity Movements Upper (arms, wrists, hands, fingers): None Lower (legs, knees, ankles, toes): None, Trunk Movements Neck, shoulders, hips: None, Global Judgements Severity of abnormal movements overall : None Incapacitation due to abnormal movements: None Patient's awareness of abnormal movements: No Awareness, Dental Status Current problems with teeth and/or dentures?: No Does patient usually wear dentures?: No  CIWA:    COWS:     Musculoskeletal: Strength & Muscle Tone: within normal limits Gait & Station: normal Patient leans: N/A  Psychiatric Specialty Exam:  Presentation  General Appearance:  Appropriate for Environment; Casual  Eye Contact: Good  Speech: Clear and Coherent  Speech Volume: Normal  Handedness: Right  Mood and Affect  Mood: Euthymic  Affect: Appropriate  Thought Process  Thought Processes: Coherent  Descriptions of Associations:Intact  Orientation:Full (Time, Place and Person)  Thought Content:Logical  History of Schizophrenia/Schizoaffective disorder:Yes  Duration of Psychotic Symptoms:Greater than six months  Hallucinations:Hallucinations: None  Ideas of Reference:None  Suicidal Thoughts:Suicidal Thoughts: No  Homicidal Thoughts:Homicidal Thoughts: No  Sensorium  Memory: Immediate Good; Recent Good  Judgment: Fair  Insight: Fair  Art therapist  Concentration: Fair  Attention Span: Fair  Recall: Fair  Fund of Knowledge: Good  Language: Good  Psychomotor  Activity  Psychomotor Activity:Psychomotor Activity: Normal  Assets  Assets: Communication Skills; Desire for Improvement; Resilience  Sleep  Sleep:Sleep: Good  Physical Exam: Physical Exam Vitals and nursing note reviewed.  Constitutional:  Appearance: Normal appearance.  HENT:     Head: Normocephalic and atraumatic.     Mouth/Throat:     Mouth: Mucous membranes are moist.     Pharynx: Oropharynx is clear.  Eyes:     Extraocular Movements: Extraocular movements intact.  Cardiovascular:     Rate and Rhythm: Normal rate.     Pulses: Normal pulses.  Pulmonary:     Effort: Pulmonary effort is normal.  Abdominal:     Comments: Deferred  Genitourinary:    Comments: Deferred Musculoskeletal:        General: Normal range of motion.     Cervical back: Normal range of motion.  Skin:    General: Skin is warm.  Neurological:     General: No focal deficit present.     Mental Status: She is alert and oriented to person, place, and time.  Psychiatric:        Mood and Affect: Mood normal.        Behavior: Behavior normal.    Review of Systems  Constitutional:  Negative for chills and fever.  HENT:  Negative for sore throat.   Eyes:  Negative for blurred vision.  Respiratory:  Negative for cough, sputum production, shortness of breath and wheezing.   Cardiovascular:  Negative for chest pain and palpitations.  Gastrointestinal:  Negative for constipation, diarrhea, heartburn, nausea and vomiting.  Genitourinary:  Negative for dysuria, frequency and urgency.  Musculoskeletal:  Negative for myalgias.  Skin:  Negative for itching and rash.  Neurological:  Negative for headaches.  Endo/Heme/Allergies:        See allergy listing  Psychiatric/Behavioral:  Positive for hallucinations. Negative for depression and suicidal ideas.    Blood pressure 122/84, pulse 72, temperature 98.4 F (36.9 C), temperature source Oral, resp. rate 12, height 4\' 11"  (1.499 m), weight 63 kg, SpO2  98%. Body mass index is 28.05 kg/m.  Treatment Plan Summary: Daily contact with patient to assess and evaluate symptoms and progress in treatment and Medication management   ASSESSMENT: Yolanda [name] is a 47 y.o. female  with a past psychiatric history of schizoaffective disorder, bipolar type, multiple prior suicide attempts, cognitive delay on disability, and a history of medication nonadherence.  Admitted to [facility] from [facility] with complaints of worsening suicidal ideations with plan to stab herself.  Patient is endorsing AH throughout the day will reassess tom, as patient has been denying this for weeks and suddenly endorsing today, when asked why she needed the Tyelnol, because patient denied having headache or any pain. May need to make adjustments to paliperdone tom if patient consistently endorses AH; however for now patient appears to be doing well on the unit and keeping herself occupied outside her room and overall pleasant.   Diagnoses / Active Problems: Schizoaffective disorder bipolar type   PLAN: Safety and Monitoring:             --  INVOLUNTARY admission to inpatient psychiatric unit for safety, stabilization and treatment             -- Daily contact with patient to assess and evaluate symptoms and progress in treatment             -- Patient's case to be discussed in multi-disciplinary team meeting             -- Observation Level : q15 minute checks             -- Vital signs:  q12 hours             --  Precautions: suicide, elopement, and assault   2. Treatment plan. -Trazodone 50 mg nightly for insomnia - Melatonin 5 mg nightly for insomnia - Zoloft 150 mg daily for depression - Paliperidone 6 mg daily for psychosis - BuSpar 15 mg twice daily for anxiety.  -- The risks/benefits/side-effects/alternatives to this medication were discussed in detail with the patient and time was given for questions. The patient consents to medication trial.               -- Metabolic profile and EKG monitoring obtained while on an atypical antipsychotic.  Other PRNS:  acetaminophen, alum & mag hydroxide-simeth, diphenhydrAMINE **OR** diphenhydrAMINE, haloperidol **OR** haloperidol lactate, hydrOXYzine, LORazepam **OR** LORazepam, magnesium hydroxide, nicotine polacrilex, polyethylene glycol, SUMAtriptan               3. Medical Issues Being Addressed:  Other medications include: scheduled Colace, hydrocortisone cream, Protonix 40 mg for GERD   # Vitamin D deficiency Vitamin D capsule 50,000 units q. 7 days   4. Discharge Planning:              -- Social work and case management to assist with discharge planning and identification of hospital follow-up needs prior to discharge             -- Estimated Discharge Date:               -- Apparently patient now has a legal guardian. Placement is in progress. The patient may not have the option to go to the mother's house unless there is 24/7 supervision.  06/13/2023: LCSW, patient, and Guardian Ad Litem had a meeting yesterday regarding patient discharge 06/12/2023.  Please see social worker's notes. May have placement for next week at a group home.               -- Discharge Concerns: Need to establish a safety plan; Medication compliance and effectiveness             -- Discharge Goals: Return home with outpatient referrals for mental health follow-up including medication management/psychotherapy    I certify that inpatient services furnished can reasonably be expected to improve the patient's condition.    Bobbye Morton, MD 06/29/2023, 2:11 PMPatient ID: Yolanda [name], female   DOB: [birth year], 47 y.o.   MRN: [id number] Patient ID: Yolanda [name], female   DOB: 1976-03-25, 47 y.o.   MRN: [id number] Patient ID: Yolanda [name], female   DOB: 1975-11-07, 47 y.o.   MRN: [id number] Patient ID: Yolanda [name], female   DOB: [birth year], 47 y.o.   MRN: [id number] Patient ID: Yolanda [name], female   DOB:  1975/08/14, 47 y.o.   MRN: [id number]

## 2023-06-29 NOTE — Progress Notes (Signed)
   06/29/23 0900  Psych Admission Type (Psych Patients Only)  Admission Status Involuntary  Psychosocial Assessment  Patient Complaints Depression  Eye Contact Fair  Facial Expression Sad  Affect Appropriate to circumstance  Speech Soft  Interaction Assertive  Motor Activity Tremors  Appearance/Hygiene Improved  Behavior Characteristics Cooperative;Appropriate to situation  Mood Pleasant  Thought Process  Coherency WDL  Content WDL  Delusions None reported or observed  Perception WDL  Hallucination None reported or observed  Judgment Impaired  Confusion None  Danger to Self  Current suicidal ideation? Denies  Self-Injurious Behavior No self-injurious ideation or behavior indicators observed or expressed   Danger to Others  Danger to Others None reported or observed

## 2023-06-29 NOTE — Progress Notes (Signed)
   06/28/23 2100  Psych Admission Type (Psych Patients Only)  Admission Status Involuntary  Psychosocial Assessment  Patient Complaints Anxiety;Depression  Eye Contact Fair  Facial Expression Worried  Affect Appropriate to circumstance  Speech Soft  Interaction Assertive  Motor Activity Tremors  Appearance/Hygiene Improved  Behavior Characteristics Cooperative;Appropriate to situation  Mood Pleasant  Thought Process  Coherency WDL  Content WDL  Delusions None reported or observed  Perception WDL  Hallucination None reported or observed  Judgment Impaired  Confusion WDL  Danger to Self  Current suicidal ideation? Denies  Self-Injurious Behavior No self-injurious ideation or behavior indicators observed or expressed   Agreement Not to Harm Self Yes  Description of Agreement Verbal  Danger to Others  Danger to Others None reported or observed

## 2023-06-29 NOTE — [facility] Group Notes (Signed)
[facility] Group Notes:  (Nursing)  Date:  06/29/2023  Time:  4:20 PM  Type of Therapy:  Psychoeducational Skills  Participation Level:  Did Not Attend  Shela Nevin 06/29/2023, 4:20 PM

## 2023-06-29 NOTE — BHH Group Notes (Signed)
Pt did not attend goals group. 

## 2023-06-29 NOTE — Group Note (Signed)
Date:  06/29/2023 Time:  8:18 PM  Group Topic/Focus:  Wrap-Up Group:   The focus of this group is to help patients review their daily goal of treatment and discuss progress on daily workbooks.    Additional Comments:  Pt was encouraged, but opted out of attending wrap up group this evening.   Chrisandra Netters 06/29/2023, 8:18 PM

## 2023-06-29 NOTE — Plan of Care (Signed)
°  Problem: Education: °Goal: Ability to make informed decisions regarding treatment will improve °Outcome: Progressing °  °Problem: Coping: °Goal: Coping ability will improve °Outcome: Progressing °  °Problem: Health Behavior/Discharge Planning: °Goal: Identification of resources available to assist in meeting health care needs will improve °Outcome: Progressing °  °

## 2023-06-29 NOTE — Progress Notes (Signed)
D. Pt presents with an improved mood today- has been working on her GED workbooks, had a good visit with her father this afternoon.   Pt currently denies SI/HI and AVH  A. Labs and vitals monitored. Pt given and educated on medications. Pt supported emotionally and encouraged to express concerns and ask questions.   R. Pt remains safe with 15 minute checks. Will continue POC.

## 2023-06-29 NOTE — Progress Notes (Signed)
Pt tearful, upset that not at group home yet.  Pt working in workbooks that she had in her room, doing math problems.  Pt also coloring Christmas pictures.  Pt appears to be trying to keep busy, bored with being here at this point.  Denies SI at this time.  06/29/23 2204  Psych Admission Type (Psych Patients Only)  Admission Status Involuntary  Psychosocial Assessment  Patient Complaints Anxiety;Depression;Crying spells  Eye Contact Fair  Facial Expression Animated  Affect Appropriate to circumstance  Speech Soft  Interaction Assertive  Motor Activity Tremors  Appearance/Hygiene Improved  Behavior Characteristics Appropriate to situation  Mood Pleasant;Depressed  Thought Process  Coherency WDL  Content WDL  Delusions None reported or observed  Perception WDL  Hallucination None reported or observed  Judgment Limited  Confusion None  Danger to Self  Current suicidal ideation? Denies  Self-Injurious Behavior No self-injurious ideation or behavior indicators observed or expressed   Agreement Not to Harm Self Yes  Description of Agreement verbal  Danger to Others  Danger to Others None reported or observed

## 2023-06-30 NOTE — Progress Notes (Signed)
   06/30/23 2205  Psych Admission Type (Psych Patients Only)  Admission Status Involuntary  Psychosocial Assessment  Patient Complaints Depression  Eye Contact Fair  Facial Expression Animated  Affect Appropriate to circumstance  Speech Logical/coherent  Interaction Assertive  Motor Activity Tremors  Appearance/Hygiene Improved  Behavior Characteristics Appropriate to situation  Mood Pleasant;Depressed  Thought Process  Coherency WDL  Content WDL  Delusions None reported or observed  Perception WDL  Hallucination None reported or observed  Judgment Limited  Confusion None  Danger to Self  Current suicidal ideation? Denies  Self-Injurious Behavior No self-injurious ideation or behavior indicators observed or expressed   Agreement Not to Harm Self Yes  Description of Agreement verbal  Danger to Others  Danger to Others None reported or observed

## 2023-06-30 NOTE — [facility] Group Notes (Signed)
[facility] Group Notes:  (Nursing/MHT/Case Management/Adjunct)  Date:  06/30/2023  Time:  8:35 PM  Type of Therapy:  Psychoeducational Skills  Participation Level:  Minimal  Participation Quality:  Resistant  Affect:  Depressed and Flat  Cognitive:  Appropriate  Insight:  Improving  Engagement in Group:  Limited  Modes of Intervention:  Education  Summary of Progress/Problems: Patient arrived late for group and stated that she did not have a good day overall. She shared in group that her oldest daughter visited her but that she had an incident where she had to calm herself down. Her goal for tomorrow is to attend more groups.   Hazle Coca S 06/30/2023, 8:35 PM

## 2023-06-30 NOTE — Plan of Care (Signed)
  Problem: Coping: Goal: Coping ability will improve Outcome: Progressing   Problem: Medication: Goal: Compliance with prescribed medication regimen will improve Outcome: Progressing   

## 2023-06-30 NOTE — Progress Notes (Signed)
   06/30/23 1100  Psych Admission Type (Psych Patients Only)  Admission Status Involuntary  Psychosocial Assessment  Patient Complaints Depression  Eye Contact Fair  Facial Expression Sad  Affect Appropriate to circumstance  Speech Soft  Interaction Assertive  Motor Activity Tremors  Appearance/Hygiene Improved  Behavior Characteristics Cooperative;Appropriate to situation  Mood Depressed  Thought Process  Coherency WDL  Content WDL  Delusions None reported or observed  Perception WDL  Hallucination None reported or observed  Judgment Limited  Confusion None  Danger to Self  Current suicidal ideation? Denies  Self-Injurious Behavior No self-injurious ideation or behavior indicators observed or expressed

## 2023-06-30 NOTE — Progress Notes (Signed)
   06/30/23 0530  15 Minute Checks  Location Bedroom  Visual Appearance Calm  Behavior Sleeping  Sleep (Behavioral Health Patients Only)  Calculate sleep? (Click Yes once per 24 hr at 0600 safety check) Yes  Documented sleep last 24 hours 7.75

## 2023-06-30 NOTE — Progress Notes (Signed)
[facility] MD Progress Note  06/30/2023 3:40 PM Hedaya ALASIAH CAMPODONICO  MRN:  [id number]  Reason for admission:  This is the first psychiatric admission in this [facility] in 12 years for this AA female with an extensive hx of mental illnesses & probable polysubstance use disorders. She is admitted to the [facility] from the [facility] hospital with complain of worsening suicidal ideations with plan to stab herself. Per chart review, patient apparently reported at the ED that she has been depressed for a while & has not been taking her mental health medications. After medical evaluation.clearance, she was transferred to the [facility] for further psychiatric evaluation/treatments. During this evaluation, Latausha presents irritated, labile, angry & tearful. She is not forth-coming with the information needed to help manage her care.  She denies delusional thinking or paranoia.  Chart Review from last 24 hours:  The patient's chart was reviewed and nursing notes were reviewed. The patient's case was discussed in multidisciplinary team meeting. Vital signs within normal limits. NO PRNs needed.  Information Obtained Today During Patient Interview: Upon initial evaluation, patient was seen lying in bed. Patient reported that she was doing ok, but endorsed she thought her meds should be changed because she was having AH. Patient was not able to endorse what the voices were saying, provider did offer the chance for patient to write it down if she felt too uncomfortable to talk. Patient was able to endorse that the voices were saying negative things. She denied SI, HI and VH. Patient endorsed good sleep and a fair appetite.   In the early afternoon, patient came out to nurse station and wrote "give up" on her paper. Patient and provider discussed and patient reported that she had deiced to write down what the voice was telling her. Patient reports that she was wondering if she was still in the hospital because she did not have enough faith  and thinks that the voice is telling her to give up on getting out. Patient endorsed that she has been thinking about her 68 yo grandson and wanted to get him a b-ball or football for Christmas. Patient and provider discussed that the voice she was hearing was likely not AH but an inner negative self voice and that coming to staff and discussing it was the right thing to do. Discussed that everyone can sometimes feel discouraged and may need additional outside support. Patient requested for any possible update on her leaving, but ultimately endorsed improved mood and went on to call family and her normal daily routine.   Principal Problem: Schizoaffective disorder, depressive type (HCC) Diagnosis: Principal Problem:   Schizoaffective disorder, depressive type (HCC) Active Problems:   GERD (gastroesophageal reflux disease)   Intellectual disability   Tobacco use disorder  Total Time spent with patient: 35 minutes  Past Psychiatric History: Previous Psychotropic Medications: Mirtazapine, Invega sustenna, Gabapentin, Cogentin, Buspar, Naltrexone, Abilify.   Past Medical History:  Past Medical History:  Diagnosis Date   Bipolar affect, depressed (HCC)    Constipation 08/17/2022   Depression    Falls 07/21/2022   Fracture of femoral neck, right, closed (HCC) 01/17/2022   Herpes simplex 08/22/2017   Open fracture dislocation of right elbow joint 01/17/2022    Past Surgical History:  Procedure Laterality Date   NO PAST SURGERIES     SALPINGECTOMY     Family History: History reviewed. No pertinent family history. Family Psychiatric  History:  Patient reports that everyone in her family has bipolar disorder.  Social History:  Social History   Substance and Sexual Activity  Alcohol Use Yes     Social History   Substance and Sexual Activity  Drug Use Yes   Types: Cocaine, Marijuana    Social History   Socioeconomic History   Marital status: Single    Spouse name: Not on file    Number of children: Not on file   Years of education: Not on file   Highest education level: Not on file  Occupational History   Not on file  Tobacco Use   Smoking status: Every Day   Smokeless tobacco: Not on file  Substance and Sexual Activity   Alcohol use: Yes   Drug use: Yes    Types: Cocaine, Marijuana   Sexual activity: Yes  Other Topics Concern   Not on file  Social History Narrative   Not on file   Social Drivers of Health   Financial Resource Strain: Low Risk  (09/12/2022)   Received from [facility], [facility]   Overall Financial Resource Strain (CARDIA)    Difficulty of Paying Living Expenses: Not hard at all  Food Insecurity: Patient Declined (12/20/2022)   Hunger Vital Sign    Worried About Running Out of Food in the Last Year: Patient declined    Ran Out of Food in the Last Year: Patient declined  Transportation Needs: No Transportation Needs (12/20/2022)   PRAPARE - Administrator, Civil Service (Medical): No    Lack of Transportation (Non-Medical): No  Physical Activity: Not on file  Stress: No Stress Concern Present (07/17/2022)   Received from [facility], [facility] - Occupational Stress Questionnaire    Feeling of Stress : Not at all  Social Connections: Unknown (07/16/2022)   Received from [facility], [facility]   Social Network    Social Network: Not on file   Additional Social History:    Sleep: Good  Appetite:  Fair  Current Medications: Current Facility-Administered Medications  Medication Dose Route Frequency Provider Last Rate Last Admin   acetaminophen (TYLENOL) tablet 650 mg  650 mg Oral Q6H PRN [name], NP   650 mg at 06/28/23 2100   alum & mag hydroxide-simeth (MAALOX/MYLANTA) 200-200-20 MG/5ML suspension 30 mL  30 mL Oral Q4H PRN Onuoha, Chinwendu V, NP   30 mL at 05/31/23 0801   busPIRone (BUSPAR) tablet 15 mg  15 mg Oral BID [name], NP   15 mg at  06/30/23 5784   cyanocobalamin (VITAMIN B12) injection 1,000 mcg  1,000 mcg Intramuscular Q30 days [name], Nadir, MD   1,000 mcg at 06/02/23 1129   diphenhydrAMINE (BENADRYL) capsule 50 mg  50 mg Oral TID PRN [name], NP   50 mg at 06/03/23 1110   Or   diphenhydrAMINE (BENADRYL) injection 50 mg  50 mg Intramuscular TID PRN [name], NP       docusate sodium (COLACE) capsule 100 mg  100 mg Oral Daily Nkwenti, Doris, NP   100 mg at 06/30/23 6962   haloperidol (HALDOL) tablet 5 mg  5 mg Oral TID PRN [name], NP   5 mg at 06/03/23 1110   Or   haloperidol lactate (HALDOL) injection 5 mg  5 mg Intramuscular TID PRN [name], NP       hydrocortisone cream 1 %   Topical BID Cecilie Lowers, FNP   1 Application at 06/29/23 0814   hydrOXYzine (ATARAX) tablet 25 mg  25 mg Oral TID PRN [name], DO   25 mg at 05/27/23 1146   LORazepam (ATIVAN) tablet 2 mg  2 mg Oral TID PRN [name], NP   2 mg at 06/03/23 1110   Or   LORazepam (ATIVAN) injection 2 mg  2 mg Intramuscular TID PRN [name], NP       magnesium hydroxide (MILK OF MAGNESIA) suspension 30 mL  30 mL Oral Daily PRN [name], NP   30 mL at 06/23/23 1856   melatonin tablet 5 mg  5 mg Oral QHS Nkwenti, Doris, NP   5 mg at 06/29/23 2058   nicotine polacrilex (NICORETTE) gum 2 mg  2 mg Oral PRN [name], MD   2 mg at 06/28/23 1503   paliperidone (INVEGA) 24 hr tablet 6 mg  6 mg Oral Daily [name], NP   6 mg at 06/29/23 2058   pantoprazole (PROTONIX) EC tablet 40 mg  40 mg Oral Daily [name], NP   40 mg at 06/30/23 0814   polyethylene glycol (MIRALAX / GLYCOLAX) packet 17 g  17 g Oral Daily PRN [name], MD   17 g at 06/07/23 1847   polyethylene glycol (MIRALAX / GLYCOLAX) packet 17 g  17 g Oral Daily [name], MD   17 g at 06/30/23 0815   sertraline (ZOLOFT) tablet 150 mg  150 mg Oral Daily Massengill, Harrold Donath, MD   150 mg at 06/30/23 2956   SUMAtriptan (IMITREX) tablet 25 mg   25 mg Oral BID PRN [name], NP   25 mg at 05/07/23 2112   traZODone (DESYREL) tablet 50 mg  50 mg Oral QHS [name], NP   50 mg at 06/29/23 2058   Vitamin D (Ergocalciferol) (DRISDOL) 1.25 MG (50000 UNIT) capsule 50,000 Units  50,000 Units Oral Q7 days [name], NP   50,000 Units at 06/29/23 2058   Lab Results:  No results found for this or any previous visit (from the past 48 hours).  Blood Alcohol level:  Lab Results  Component Value Date   ETH <10 12/19/2022   ETH <10 11/21/2019   Metabolic Disorder Labs: Lab Results  Component Value Date   HGBA1C 5.6 06/16/2023   MPG 114.02 06/16/2023   MPG 79.58 12/21/2022   No results found for: "PROLACTIN" Lab Results  Component Value Date   CHOL 256 (H) 06/16/2023   TRIG 224 (H) 06/16/2023   HDL 83 06/16/2023   CHOLHDL 3.1 06/16/2023   VLDL 45 (H) 06/16/2023   LDLCALC 128 (H) 06/16/2023   LDLCALC 149 (H) 12/21/2022   Physical Findings: AIMS: Facial and Oral Movements Muscles of Facial Expression: None Lips and Perioral Area: None Jaw: None Tongue: None,Extremity Movements Upper (arms, wrists, hands, fingers): None Lower (legs, knees, ankles, toes): None, Trunk Movements Neck, shoulders, hips: None, Global Judgements Severity of abnormal movements overall : None Incapacitation due to abnormal movements: None Patient's awareness of abnormal movements: No Awareness, Dental Status Current problems with teeth and/or dentures?: No Does patient usually wear dentures?: No  CIWA:    COWS:     Musculoskeletal: Strength & Muscle Tone: within normal limits Gait & Station: normal Patient leans: N/A  Psychiatric Specialty Exam:  Presentation  General Appearance:  Appropriate for Environment; Casual  Eye Contact: Good  Speech: Clear and Coherent  Speech Volume: Normal  Handedness: Right  Mood and Affect  Mood: Dysphoric  Affect: Congruent  Thought Process  Thought  Processes: Linear  Descriptions  of Associations:Circumstantial  Orientation:Full (Time, Place and Person)  Thought Content:Rumination  History of Schizophrenia/Schizoaffective disorder:Yes  Duration of Psychotic Symptoms:Greater than six months  Hallucinations:Hallucinations: None  Ideas of Reference:None  Suicidal Thoughts:Suicidal Thoughts: No  Homicidal Thoughts:Homicidal Thoughts: No  Sensorium  Memory: Immediate Good; Recent Good  Judgment: Fair  Insight: Shallow  Executive Functions  Concentration: Fair  Attention Span: Fair  Recall: Fair  Fund of Knowledge: Fair  Language: Fair  Psychomotor Activity  Psychomotor Activity:Psychomotor Activity: Normal  Assets  Assets: Resilience  Sleep  Sleep:Sleep: Good Number of Hours of Sleep: 7.75  Physical Exam: Physical Exam Vitals and nursing note reviewed.  Constitutional:      Appearance: Normal appearance.  HENT:     Head: Normocephalic and atraumatic.     Mouth/Throat:     Mouth: Mucous membranes are moist.     Pharynx: Oropharynx is clear.  Eyes:     Extraocular Movements: Extraocular movements intact.  Cardiovascular:     Rate and Rhythm: Normal rate.     Pulses: Normal pulses.  Pulmonary:     Effort: Pulmonary effort is normal.  Abdominal:     Comments: Deferred  Genitourinary:    Comments: Deferred Musculoskeletal:        General: Normal range of motion.     Cervical back: Normal range of motion.  Skin:    General: Skin is warm.  Neurological:     General: No focal deficit present.     Mental Status: She is alert and oriented to person, place, and time.  Psychiatric:        Mood and Affect: Mood normal.        Behavior: Behavior normal.    Review of Systems  Constitutional:  Negative for chills and fever.  HENT:  Negative for sore throat.   Eyes:  Negative for blurred vision.  Respiratory:  Negative for cough, sputum production, shortness of breath and wheezing.    Cardiovascular:  Negative for chest pain and palpitations.  Gastrointestinal:  Negative for constipation, diarrhea, heartburn, nausea and vomiting.  Genitourinary:  Negative for dysuria, frequency and urgency.  Musculoskeletal:  Negative for myalgias.  Skin:  Negative for itching and rash.  Neurological:  Negative for headaches.  Endo/Heme/Allergies:        See allergy listing  Psychiatric/Behavioral:  Negative for depression, hallucinations and suicidal ideas.    Blood pressure 97/72, pulse 68, temperature 98.4 F (36.9 C), temperature source Oral, resp. rate 12, height 4\' 11"  (1.499 m), weight 63 kg, SpO2 98%. Body mass index is 28.05 kg/m.  Treatment Plan Summary: Daily contact with patient to assess and evaluate symptoms and progress in treatment and Medication management   ASSESSMENT: Aedyn D Hufnagle is a 47 y.o. female  with a past psychiatric history of schizoaffective disorder, bipolar type, multiple prior suicide attempts, cognitive delay on disability, and a history of medication nonadherence.  Admitted to [facility] from [facility] with complaints of worsening suicidal ideations with plan to stab herself.  Patient reported AH appears to be inner voice and patient struggling to conceptualize this. Patient reaction of self-doubt and concern about her placement appears reasonable given her , intellectual limitations, length of stay and the upcoming holidays. Patient was able to have improved mood with appropriate conversation. No medication adjustments will be made.    Diagnoses / Active Problems: Schizoaffective disorder bipolar type   PLAN: Safety and Monitoring:             --  INVOLUNTARY admission to inpatient psychiatric unit for safety, stabilization and treatment             -- Daily contact with patient to assess and evaluate symptoms and progress in treatment             -- Patient's case to be discussed in multi-disciplinary team meeting             --  Observation Level : q15 minute checks             -- Vital signs:  q12 hours             -- Precautions: suicide, elopement, and assault   2. Treatment plan. -Trazodone 50 mg nightly for insomnia - Melatonin 5 mg nightly for insomnia - Zoloft 150 mg daily for depression - Paliperidone 6 mg daily for psychosis - BuSpar 15 mg twice daily for anxiety.  -- The risks/benefits/side-effects/alternatives to this medication were discussed in detail with the patient and time was given for questions. The patient consents to medication trial.              -- Metabolic profile and EKG monitoring obtained while on an atypical antipsychotic.  Other PRNS:  acetaminophen, alum & mag hydroxide-simeth, diphenhydrAMINE **OR** diphenhydrAMINE, haloperidol **OR** haloperidol lactate, hydrOXYzine, LORazepam **OR** LORazepam, magnesium hydroxide, nicotine polacrilex, polyethylene glycol, SUMAtriptan               3. Medical Issues Being Addressed:  Other medications include: scheduled Colace, hydrocortisone cream, Protonix 40 mg for GERD   # Vitamin D deficiency Vitamin D capsule 50,000 units q. 7 days   4. Discharge Planning:              -- Social work and case management to assist with discharge planning and identification of hospital follow-up needs prior to discharge             -- Estimated Discharge Date:               -- Apparently patient now has a legal guardian. Placement is in progress. The patient may not have the option to go to the mother's house unless there is 24/7 supervision.  06/13/2023: LCSW, patient, and Guardian Ad Litem had a meeting yesterday regarding patient discharge 06/12/2023.  Please see social worker's notes. May have placement for next week at a group home.               -- Discharge Concerns: Need to establish a safety plan; Medication compliance and effectiveness             -- Discharge Goals: Return home with outpatient referrals for mental health follow-up including  medication management/psychotherapy    I certify that inpatient services furnished can reasonably be expected to improve the patient's condition.    Bobbye Morton, MD 06/30/2023, 3:40 PMPatient ID: Larey Brick, female   DOB: 07-23-1975, 47 y.o.   MRN: [id number] Patient ID: NARDA GRAFT, female   DOB: 05-16-1976, 47 y.o.   MRN: [id number] Patient ID: ZANARI EISENSTEIN, female   DOB: [birth year], 47 y.o.   MRN: [id number] Patient ID: TAYLEA LATHON, female   DOB: 10/23/1975, 47 y.o.   MRN: [id number] Patient ID: HARTLEIGH LOSCH, female   DOB: 1975-10-10, 47 y.o.   MRN: [id number]

## 2023-06-30 NOTE — BHH Group Notes (Signed)
Type of Therapy and Topic:  Group Therapy: Mind full vs Mindful  Participation Level:  Did Not Attend   Description of Group:   In this group, patients shared and discussed the importance of acknowledging the elements in their lives for which they are mindful and how this can positively impact their mood.  The group discussed how bringing the positive elements of their lives to the forefront of their minds can help with recovery from any illness, physical or mental.  An exercise was done as a group in which a list was made of mindful items in order to encourage participants to consider other potential positives in their lives.  Therapeutic Goals: Patients will identify one or more item for which they are mindful in each of 6 categories:  people, experiences, things, places, skills, and other. Patients will discuss how it is possible to seek out mindful in even bad situations. Patients will explore other possible items of mindful that they could remember.   Summary of Patient Progress:  NA  Therapeutic Modalities:   Solution-Focused Therapy Activity

## 2023-06-30 NOTE — Plan of Care (Signed)
  Problem: Coping: Goal: Ability to verbalize frustrations and anger appropriately will improve Outcome: Progressing Goal: Ability to demonstrate self-control will improve Outcome: Progressing   

## 2023-07-01 DIAGNOSIS — F251 Schizoaffective disorder, depressive type: Secondary | ICD-10-CM | POA: Diagnosis not present

## 2023-07-01 NOTE — Progress Notes (Signed)
   07/01/23 1300  Psych Admission Type (Psych Patients Only)  Admission Status Involuntary  Psychosocial Assessment  Patient Complaints Depression  Eye Contact Fair  Facial Expression Animated  Affect Appropriate to circumstance  Speech Logical/coherent  Interaction Assertive  Motor Activity Tremors  Appearance/Hygiene Unremarkable  Behavior Characteristics Appropriate to situation  Mood Pleasant;Depressed  Thought Process  Coherency WDL  Content WDL  Delusions None reported or observed  Perception WDL  Hallucination None reported or observed  Judgment Limited  Confusion None  Danger to Self  Current suicidal ideation? Denies  Danger to Others  Danger to Others None reported or observed

## 2023-07-01 NOTE — BHH Group Notes (Signed)
BHH Group Notes:  (Nursing/MHT/Case Management/Adjunct)  Date:  07/01/2023  Time:  10:08 AM  Type of Therapy:  Orientation and Goal group   Participation Level:  Did Not Attend  Participation Quality:   did not attend  Affect:   did not attend  Cognitive:   did not attend  Insight:  None  Engagement in Group:   did not attend  Modes of Intervention:   did not attend  Summary of Progress/Problems:  Yolanda [name] 07/01/2023, 10:08 AM

## 2023-07-01 NOTE — Plan of Care (Signed)
  Problem: Education: Goal: Ability to make informed decisions regarding treatment will improve Outcome: Progressing   Problem: Health Behavior/Discharge Planning: Goal: Identification of resources available to assist in meeting health care needs will improve Outcome: Progressing   

## 2023-07-01 NOTE — Progress Notes (Signed)
   07/01/23 0533  15 Minute Checks  Location Bedroom  Visual Appearance Calm  Behavior Sleeping  Sleep (Behavioral Health Patients Only)  Calculate sleep? (Click Yes once per 24 hr at 0600 safety check) Yes  Documented sleep last 24 hours 7.5

## 2023-07-01 NOTE — Group Note (Signed)
Date:  07/01/2023 Time:  11:06 PM  Group Topic/Focus:  Wrap-Up Group:   The focus of this group is to help patients review their daily goal of treatment and discuss progress on daily workbooks.    Additional Comments:  Pt was encouraged, but opted out of participating in wrap up group this evening.   Chrisandra Netters 07/01/2023, 11:06 PM

## 2023-07-01 NOTE — Group Note (Signed)
Recreation Therapy Group Note   Group Topic:Problem Solving  Group Date: 07/01/2023 Start Time: 0932 End Time: 0956 Facilitators: Francisco Ostrovsky-McCall, LRT,CTRS Location: [address] Dayroom   Group Topic: Communication, Team Building, Problem Solving  Goal Area(s) Addresses:  Patient will effectively work with peer towards shared goal.  Patient will identify skills used to make activity successful.  Patient will identify how skills used during activity can be applied to reach post d/c goals.   Intervention: STEM Activity- Glass blower/designer  Group Description: Tallest Pharmacist, community. In teams of 5-6, patients were given 11 craft pipe cleaners. Using the materials provided, patients were instructed to compete again the opposing team(s) to build the tallest free-standing structure from floor level. The activity was timed; difficulty increased by Clinical research associate as Production designer, theatre/television/film continued.  Systematically resources were removed with additional directions for example, placing one arm behind their back, working in silence, and shape stipulations. LRT facilitated post-activity discussion reviewing team processes and necessary communication skills involved in completion. Patients were encouraged to reflect how the skills utilized, or not utilized, in this activity can be incorporated to positively impact support systems post discharge.  Education: Pharmacist, community, Scientist, physiological, Discharge Planning   Education Outcome: Acknowledges education/In group clarification offered/Needs additional education.    Affect/Mood: N/A   Participation Level: Did not attend    Clinical Observations/Individualized Feedback:    Plan: Continue to engage patient in RT group sessions 2-3x/week.   [name], LRT,CTRS  07/01/2023 11:31 AM

## 2023-07-01 NOTE — Discharge Planning (Signed)
LCSW attempted to get in contact with Dr. Cheree Ditto at [facility] [phone] to inquire about any updates received from [facility], however received no answer. LCSW left voice message at 11:03am requesting phone call back. LCSW attempted to get in contact with [name] at [facility] in [place], [place] [phone], however received no answer. LCSW left voice message at 11:05am requesting phone call back. LCSW contacted DSS Representative Thamas Jaegers at [phone] via text to inform that no update has been received as yet. LCSW advised Thamas Jaegers to follow up with Group Home as able. LCSW informed TOC Supervisor [name] that there are no updates at this time regarding patient and discharge plan.   Fernande Boyden, LCSW Clinical Social Worker [facility] Ph: [phone]

## 2023-07-01 NOTE — Plan of Care (Signed)
  Problem: Education: Goal: Knowledge of [facility] Education information/materials will improve Outcome: Progressing Goal: Emotional status will improve Outcome: Progressing Goal: Mental status will improve Outcome: Progressing Goal: Verbalization of understanding the information provided will improve Outcome: Progressing   Problem: Activity: Goal: Interest or engagement in activities will improve Outcome: Progressing Goal: Sleeping patterns will improve Outcome: Progressing   Problem: Coping: Goal: Ability to verbalize frustrations and anger appropriately will improve Outcome: Progressing Goal: Ability to demonstrate self-control will improve Outcome: Progressing   Problem: Health Behavior/Discharge Planning: Goal: Identification of resources available to assist in meeting health care needs will improve Outcome: Progressing Goal: Compliance with treatment plan for underlying cause of condition will improve Outcome: Progressing   Problem: Physical Regulation: Goal: Ability to maintain clinical measurements within normal limits will improve Outcome: Progressing   Problem: Safety: Goal: Periods of time without injury will increase Outcome: Progressing   Problem: Education: Goal: Ability to make informed decisions regarding treatment will improve Outcome: Progressing   Problem: Coping: Goal: Coping ability will improve Outcome: Progressing   Problem: Health Behavior/Discharge Planning: Goal: Identification of resources available to assist in meeting health care needs will improve Outcome: Progressing   Problem: Medication: Goal: Compliance with prescribed medication regimen will improve Outcome: Progressing   Problem: Self-Concept: Goal: Ability to disclose and discuss suicidal ideas will improve Outcome: Progressing Goal: Will verbalize positive feelings about self Outcome: Progressing Note: Patient is on track. Patient will maintain adherence

## 2023-07-01 NOTE — BHH Group Notes (Signed)

## 2023-07-02 DIAGNOSIS — F251 Schizoaffective disorder, depressive type: Secondary | ICD-10-CM | POA: Diagnosis not present

## 2023-07-02 NOTE — Group Note (Signed)
LCSW Group Therapy Note   Group Date: 07/02/2023 Start Time: 1330 End Time: 1430   Topic: Group Therapy: Goal Exploration   Due to the acuity and complex discharge plans, group was not held. Patient was provided therapeutic worksheets and asked to meet with CSW as needed.  Kathi Der, LCSWA 07/02/2023  2:43 PM

## 2023-07-02 NOTE — Discharge Planning (Addendum)
LCSW started search for secondary option for placement on 06/28/2023. LCSW attempted to follow up with the following facilities regarding referral process and bed availability. Patient will only qualify for Supervised Living/Alternative Family Living (AFL) or Supervised Living DD Adults per [facility] Manager:   Janelle Floor Place: [address] [place], [place] [address] [phone]:  No Answer [facility]: [address] [place] [address] [place], [place] [address] [phone]: Males only  [facility] (2) [facility] [address]; [place], [place] [address] [phone]: No answer Carman Ching (6) [facility] [address]; [place], [place] [address] [phone]: No answer [address] (4) [address], Inc. [address] [place], [place] [address] [phone]: No answer [facility] (4) [facility] [address] [place]; [place], [place] [address] [phone]: No answer Mariann Laster (6) [address], Inc[address] [place], [place] [address] [phone]: Number Disconnected.  [address] [facility] [place]. [address] [place], [place] [address] [phone]: Advised to call Admissions at [phone] ext [phone]; Voice message left with Stanford Scotland at 2:42pm [employer] [facility] (3) [facility] [address], [address]; [place], [place] [address] [phone]: No beds available at this time.  [facility] (3) [facility] [address]; [place], [place] [address] [phone]: Number is disconnected.  [facility] (9) Louanna Raw [address] [place]; [place], [place] [address] [phone]: All males [facility] [place] (5) [facility] [address] [place], [place] [address] [phone]; Males only [facility] (4) Ferdinand Lango [address]; [place], [place] [address] [phone]: No  answer [facility] (6) Kathee Delton [address]; [place], [place] [address] [phone]: Mailbox full [facility] (6) [facility] [place] [address]; [place], [place] [address] [phone]: Number disconnected. [facility] (3) [facility] [address] [place], [place] [address] [phone]: No answer [address] (5) [facility] [address]; [place], [place] [address] [phone]: Voice message left at 2:53pm on 06/28/2023 [facility] (3) [employer] [employer] [address]; [place], [place] [address] [phone]: No answer [facility] (3) [facility] [address] [place], [place] [address] [phone]: Number not in service  [facility] (5) T[facility]. Simpson [address]; [place], [place] [address] [phone]: No answer [employer] [facility] (3) [employer] [address]; [place], [place] [address] [phone]: Number disconnected.  [facility] (4) [facility] [place], [place] [address]; [place], [place] [address] [phone]: No answer [address] (5) [facility] [address]; [place], [place] [address] [phone]: Number disconnected.  [facility] (3) [facility] [address] [place], [place] [address] [phone]: Number disconnected. [employer] (3) [facility] [address]; [place], [place] [address] [phone]: No answer  [facility] (6) [employer] [employer] [address] [place], [place] [address] [phone]: No answer [facility] (6) [place] [facility] [place] [address] [place], [place] [address] [phone]: No answer [facility] (6) [address] [place], [place] [address] [phone]: No answer [facility] (6) [facility] [address] [place], [place] [address] [phone]: No answer [facility] (6) Norberto Sorenson [address]; [place], [place] [address] [phone]: No answer [facility] (3) [facility] [place] [facility] [place], [place] [address]; [place],  [place] [address] [phone]: Spoke with Representative Derrick who reports if patient has innovation waiver then she could be consider for placement. LCSW to follow back up with Representative once this information can be confirmed. [employer] (9) [facility] [address] [place]; [place], [place] [address] [phone]: No female beds available [facility] (6) [facility] [address] [place], [place] [address] [place], [place] [address] [phone]: No answer [facility] (3) [facility] [address] [place], [place] [address] [phone]: LCSW advised to call and speak with Admissions Coordinator April at [phone]. LCSW attempted to contact, however received no answer. Will follow up at a later time.  [address] [facility] (3) Letithia Morene Antu [address] [place], [place] [address] [phone]: No answer [facility] (4) [address] [place], [place] [address] [phone]: No answer [facility] (5) [employer] [employer] [address]; [place], [place] [address] [phone]: Voice message left at 3:47pm on 06/28/23 requesting phone call back. [phone] [employer] [facility] (3) [employer] [address] [place], [place] [address] [phone]: Spoke with Representative Minerva Areola who reports if patient has innovation waiver then she could be consider for placement. LCSW to follow back up with Representative once this information can be confirmed.   LCSW will continue to follow and provide support to patient while on [facility] unit. Updates will continue to be provided as received.   Fernande Boyden, LCSW Clinical Social Worker [place] Ph: [phone]

## 2023-07-02 NOTE — Plan of Care (Signed)
  Problem: Education: Goal: Knowledge of [facility] Education information/materials will improve Outcome: Progressing Goal: Emotional status will improve Outcome: Progressing Goal: Mental status will improve Outcome: Progressing Goal: Verbalization of understanding the information provided will improve Outcome: Progressing   Problem: Activity: Goal: Interest or engagement in activities will improve Outcome: Progressing Goal: Sleeping patterns will improve Outcome: Progressing   Problem: Coping: Goal: Ability to verbalize frustrations and anger appropriately will improve Outcome: Progressing Goal: Ability to demonstrate self-control will improve Outcome: Progressing   Problem: Health Behavior/Discharge Planning: Goal: Identification of resources available to assist in meeting health care needs will improve Outcome: Progressing Goal: Compliance with treatment plan for underlying cause of condition will improve Outcome: Progressing   Problem: Physical Regulation: Goal: Ability to maintain clinical measurements within normal limits will improve Outcome: Progressing   Problem: Safety: Goal: Periods of time without injury will increase Outcome: Progressing   Problem: Education: Goal: Ability to make informed decisions regarding treatment will improve Outcome: Progressing   Problem: Coping: Goal: Coping ability will improve Outcome: Progressing   Problem: Health Behavior/Discharge Planning: Goal: Identification of resources available to assist in meeting health care needs will improve Outcome: Progressing   Problem: Medication: Goal: Compliance with prescribed medication regimen will improve Outcome: Progressing   Problem: Self-Concept: Goal: Ability to disclose and discuss suicidal ideas will improve Outcome: Progressing Goal: Will verbalize positive feelings about self Outcome: Progressing Note: Patient is on track. Patient will maintain adherence

## 2023-07-02 NOTE — Group Note (Signed)
Date:  07/02/2023 Time:  9:14 PM  Group Topic/Focus:  Wrap-Up Group:   The focus of this group is to help patients review their daily goal of treatment and discuss progress on daily workbooks.    Participation Level:  Did Not Attend  Participation Quality:   N/A  Affect:   N/A  Cognitive:   N/A  Insight: None  Engagement in Group:   N/A  Modes of Intervention:   N/A  Additional Comments:  Patient did not attend wrap up group.   Kennieth Francois 07/02/2023, 9:14 PM

## 2023-07-02 NOTE — Progress Notes (Signed)
[facility] MD Progress Note 07/02/2023 3:28 PM Yolanda [name]  MRN:  [id number] Principal Problem: Schizoaffective disorder, depressive type (HCC) Diagnosis: Principal Problem:   Schizoaffective disorder, depressive type (HCC) Active Problems:   GERD (gastroesophageal reflux disease)   Intellectual disability   Tobacco use disorder   Subjective:   Yolanda [name] is a 47 yr old female who presented to [facility] on [facility]2022 with an extensive hx of mental illnesses & probable polysubstance use disorders. She was admitted to the [facility] from the [facility] hospital with complain of worsening suicidal ideations with plan to stab herself. Per chart review, patient apparently reported at the ED that she has been depressed for a while & has not been taking her mental health medications. After medical evaluation.clearance, she was transferred to the [facility] for further psychiatric evaluation/treatments. During this evaluation, Gwendolynn presents irritated, labile, angry & tearful. She is not forth-coming with the information needed to help manage her care. She denies delusional thinking or paranoia.   Case was discussed in the multidisciplinary team. MAR was reviewed and patient is compliant with medications.   PRN's in last 24 hours: Date Time Med Dose 12/231928 hydroxyzine 25 mg  Psychiatric Team made the following recommendations yesterday: -Trazodone 50 mg nightly for insomnia - Melatonin 5 mg nightly for insomnia - Zoloft 150 mg daily for depression - Paliperidone 6 mg daily for psychosis - BuSpar 15 mg twice daily for anxiety.  Today on interview, pt reports fair sleep, with one nightmare. She was able to go back to sleep, but remained nervous about sleep. She reported fair appetite. She mentioned a desire to do more coloring and word search books.   Sleep: Fair Appetite:  Good Depression: "okay" Anxiety: "okay" Auditory Hallucinations: Reports that she hears voices. Visual Hallucinations:  Denies Paranoia: Denies HI: Denies SI: Denies Side effects from medications: does not endorse any side-effects they attribute to medications. Other concerns discussed with patient: Patient mentioned several people that we knew in common from earlier in her hospitalization. Mentioned that she was still in touch with several of the patients.   Total time spent with patient: 30 minutes  Past psychiatric history: Previous Psychotropic Medications: Mirtazapine, Invega sustenna, Gabapentin, Cogentin, Buspar, Naltrexone, Abilify.     Past Medical History:  Past Medical History:  Diagnosis Date   Bipolar affect, depressed (HCC)    Constipation 08/17/2022   Depression    Falls 07/21/2022   Fracture of femoral neck, right, closed (HCC) 01/17/2022   Herpes simplex 08/22/2017   Open fracture dislocation of right elbow joint 01/17/2022    Past Surgical History:  Procedure Laterality Date   NO PAST SURGERIES     SALPINGECTOMY     Family History: History reviewed. No pertinent family history. Family Psychiatric History: Patient reports that everyone in her family has bipolar disorder.   Social History:  Social History   Substance and Sexual Activity  Alcohol Use Yes     Social History   Substance and Sexual Activity  Drug Use Yes   Types: Cocaine, Marijuana    Social History   Socioeconomic History   Marital status: Single    Spouse name: Not on file   Number of children: Not on file   Years of education: Not on file   Highest education level: Not on file  Occupational History   Not on file  Tobacco Use   Smoking status: Every Day   Smokeless tobacco: Not on file  Substance and Sexual Activity  Alcohol use: Yes   Drug use: Yes    Types: Cocaine, Marijuana   Sexual activity: Yes  Other Topics Concern   Not on file  Social History Narrative   Not on file   Social Drivers of Health   Financial Resource Strain: Low Risk  (09/12/2022)   [facility]   Overall Financial Resource Strain (CARDIA)    Difficulty of Paying Living Expenses: Not hard at all  Food Insecurity: Patient Declined (12/20/2022)   Hunger Vital Sign    Worried About Running Out of Food in the Last Year: Patient declined    Ran Out of Food in the Last Year: Patient declined  Transportation Needs: No Transportation Needs (12/20/2022)   PRAPARE - Administrator, Civil Service (Medical): No    Lack of Transportation (Non-Medical): No  Physical Activity: Not on file  Stress: No Stress Concern Present (07/17/2022)   [facility] of Occupational Health - Occupational Stress Questionnaire    Feeling of Stress : Not at all  Social Connections: Unknown (07/16/2022)   [facility]   Social Network    Social Network: Not on file   Additional Social History:   Current Medications: Current Facility-Administered Medications  Medication Dose Route Frequency Provider Last Rate Last Admin   acetaminophen (TYLENOL) tablet 650 mg  650 mg Oral Q6H PRN [name], NP   650 mg at 06/28/23 2100   alum & mag hydroxide-simeth (MAALOX/MYLANTA) 200-200-20 MG/5ML suspension 30 mL  30 mL Oral Q4H PRN Onuoha, Chinwendu V, NP   30 mL at 05/31/23 0801   busPIRone (BUSPAR) tablet 15 mg  15 mg Oral BID [name], NP   15 mg at 07/02/23 0804   cyanocobalamin (VITAMIN B12) injection 1,000 mcg  1,000 mcg Intramuscular Q30 days Sarita Bottom, MD   1,000 mcg at 06/02/23 1129   diphenhydrAMINE (BENADRYL) capsule 50 mg  50 mg Oral TID PRN [name], NP   50 mg at 06/03/23 1110   Or   diphenhydrAMINE (BENADRYL) injection 50 mg  50 mg Intramuscular TID PRN [name], NP       docusate sodium (COLACE) capsule 100 mg  100 mg Oral Daily Nkwenti, Doris, NP   100 mg at 07/02/23 0804   haloperidol (HALDOL) tablet 5 mg  5 mg Oral TID PRN [name], NP   5 mg at 06/03/23 1110   Or    haloperidol lactate (HALDOL) injection 5 mg  5 mg Intramuscular TID PRN [name], NP       hydrocortisone cream 1 %   Topical BID Cecilie Lowers, FNP   1 Application at 06/29/23 1610   hydrOXYzine (ATARAX) tablet 25 mg  25 mg Oral TID PRN [name], DO   25 mg at 07/01/23 1928   LORazepam (ATIVAN) tablet 2 mg  2 mg Oral TID PRN [name], NP   2 mg at 06/03/23 1110   Or   LORazepam (ATIVAN) injection 2 mg  2 mg Intramuscular TID PRN [name], NP       magnesium hydroxide (MILK OF MAGNESIA) suspension 30 mL  30 mL Oral Daily PRN [name], NP   30 mL at 06/23/23 1856   melatonin tablet 5 mg  5 mg Oral QHS Nkwenti, Doris, NP   5 mg at 07/01/23 2117   nicotine polacrilex (NICORETTE) gum 2 mg  2 mg Oral PRN [name], MD   2 mg at 06/30/23 1755   paliperidone (INVEGA) 24 hr tablet 6 mg  6 mg Oral Daily Nkwenti, Tyler Aas, NP   6 mg at 07/01/23 2117   pantoprazole (PROTONIX) EC tablet 40 mg  40 mg Oral Daily Nwoko, Nicole Kindred I, NP   40 mg at 07/02/23 0804   polyethylene glycol (MIRALAX / GLYCOLAX) packet 17 g  17 g Oral Daily PRN [name], MD   17 g at 06/07/23 1847   polyethylene glycol (MIRALAX / GLYCOLAX) packet 17 g  17 g Oral Daily [name], MD   17 g at 06/30/23 0815   sertraline (ZOLOFT) tablet 150 mg  150 mg Oral Daily Massengill, Harrold Donath, MD   150 mg at 07/02/23 0804   SUMAtriptan (IMITREX) tablet 25 mg  25 mg Oral BID PRN [name], NP   25 mg at 05/07/23 2112   traZODone (DESYREL) tablet 50 mg  50 mg Oral QHS [name], NP   50 mg at 07/01/23 2117   Vitamin D (Ergocalciferol) (DRISDOL) 1.25 MG (50000 UNIT) capsule 50,000 Units  50,000 Units Oral Q7 days [name], NP   50,000 Units at 06/29/23 2058    Lab Results:  No results found for this or any previous visit (from the past 48 hours).  Blood Alcohol level:  Lab Results  Component Value Date   ETH <10 12/19/2022   ETH <10 11/21/2019    Metabolic Disorder Labs: Lab Results   Component Value Date   HGBA1C 5.6 06/16/2023   MPG 114.02 06/16/2023   MPG 79.58 12/21/2022   No results found for: "PROLACTIN" Lab Results  Component Value Date   CHOL 256 (H) 06/16/2023   TRIG 224 (H) 06/16/2023   HDL 83 06/16/2023   CHOLHDL 3.1 06/16/2023   VLDL 45 (H) 06/16/2023   LDLCALC 128 (H) 06/16/2023   LDLCALC 149 (H) 12/21/2022    Physical Findings: AIMS: Facial and Oral Movements Muscles of Facial Expression: None Lips and Perioral Area: None Jaw: None Tongue: None,Extremity Movements Upper (arms, wrists, hands, fingers): None Lower (legs, knees, ankles, toes): None, Trunk Movements Neck, shoulders, hips: None, Global Judgements Severity of abnormal movements overall : None Incapacitation due to abnormal movements: None Patient's awareness of abnormal movements: No Awareness, Dental Status Current problems with teeth and/or dentures?: No Does patient usually wear dentures?: No  CIWA:    COWS:     Musculoskeletal: Strength & Muscle Tone: within normal limits Gait & Station:  Pt was lying down. Patient leans: N/A  Psychiatric Specialty Exam:  Presentation  General Appearance:   Appropriate for Environment; Casual  Eye Contact:  Good  Speech:  Clear and Coherent  Speech Volume:  Normal  Handedness:  Right   Mood and Affect  Mood:  Dysphoric  Affect:  Congruent   Thought Process  Thought Processes:  Coherent  Descriptions of Associations: Circumstantial  Orientation: Full (Time, Place and Person)  Thought Content: Rumination; Tangential  History of Schizophrenia/Schizoaffective disorder: Yes  Duration of Psychotic Symptoms: Greater than six months  Hallucinations: Hallucinations: Auditory; Command Description of Command Hallucinations: pt reports command hallucinations telling her to harm herself.  Ideas of Reference: None  Suicidal Thoughts: Suicidal Thoughts: No  Homicidal Thoughts: Homicidal Thoughts:  No   Sensorium  Memory: Immediate Fair; Recent Good; Remote Good  Judgment:  Fair  Insight:  Shallow   Executive Functions  Concentration:  Fair  Attention Span:  Fair  Recall:  Fair  Fund of Knowledge:  Fair  Language:  Fair  Psychomotor Activity  Psychomotor Activity: Psychomotor Activity: Normal   Physical Exam: Physical Exam Vitals and nursing note reviewed.  Constitutional:      General: She is not in acute distress.    Appearance: She is obese. She is not ill-appearing.  HENT:     Head: Normocephalic and atraumatic.     Nose: Nose normal.  Pulmonary:     Effort: Pulmonary effort is normal.  Neurological:     General: No focal deficit present.     Mental Status: She is alert and oriented to person, place, and time.  Psychiatric:        Attention and Perception: Attention normal. She perceives auditory hallucinations.        Behavior: Behavior normal.        Cognition and Memory: Memory normal.        Judgment: Judgment normal.     Review of Systems  Constitutional:  Negative for chills, fever and weight loss.  Respiratory:  Negative for cough.   Cardiovascular:  Positive for chest pain.       Non cardiac chest pain. Minor, right side of chest. Pt reports it is reproducible with palpation   Gastrointestinal:  Negative for abdominal pain, constipation, diarrhea, heartburn, nausea and vomiting.  Genitourinary:  Negative for dysuria.  Neurological:  Negative for headaches.  Psychiatric/Behavioral:  Positive for depression and hallucinations. Negative for memory loss, substance abuse and suicidal ideas. The patient is not nervous/anxious and does not have insomnia.     Blood pressure 117/70, pulse 62, temperature 98 F (36.7 C), resp. rate 16, height 4\' 11"  (1.499 m), weight 63 kg, SpO2 100%. Body mass index is 28.05 kg/m.  ASSESSMENT: Patience D Piela is a 47 y.o. female  with a past psychiatric history of schizoaffective disorder, bipolar  type, multiple prior suicide attempts, cognitive delay on disability, and a history of medication nonadherence.  Ad[facility] to [facility] with complaints of worsening suicidal ideations with plan to stab herself.   Patient reported AH appears to be inner voice and patient struggling to conceptualize this. Pt reported that she hears a voice telling her to harm herself, but agree with previous providers that it sounds like an internal voice of doubt and self-loathing. Patient reaction of self-doubt and concern about her placement appears reasonable given her intellectual limitations, length of stay and the upcoming holidays. Patient was able to have improved mood with appropriate conversation. No medication adjustments will be made.    Diagnoses / Active Problems: Schizoaffective disorder bipolar type   PLAN: Safety and Monitoring:             --  INVOLUNTARY admission to inpatient psychiatric unit for safety, stabilization and treatment             -- Daily contact with patient to assess and evaluate symptoms and progress in treatment             -- Patient's case to be discussed in multi-disciplinary team meeting             -- Observation Level : q15 minute checks             -- Vital signs:  q12 hours             -- Precautions: suicide, elopement, and assault   2. Treatment plan. -Trazodone 50 mg nightly for insomnia - Melatonin 5 mg nightly for insomnia -  Zoloft 150 mg daily for depression - Paliperidone 6 mg daily for psychosis - BuSpar 15 mg twice daily for anxiety.   -- The risks/benefits/side-effects/alternatives to this medication were discussed in detail with the patient and time was given for questions. The patient consents to medication trial.              -- Metabolic profile and EKG monitoring obtained while on an atypical antipsychotic.   Other PRNS:  acetaminophen, alum & mag hydroxide-simeth, diphenhydrAMINE **OR** diphenhydrAMINE, haloperidol **OR**  haloperidol lactate, hydrOXYzine, LORazepam **OR** LORazepam, magnesium hydroxide, nicotine polacrilex, polyethylene glycol, SUMAtriptan               3. Medical Issues Being Addressed:  Other medications include: scheduled Colace, hydrocortisone cream, Protonix 40 mg for GERD   # Vitamin D deficiency Vitamin D capsule 50,000 units q. 7 days   4. Discharge Planning:              -- Social work and case management to assist with discharge planning and identification of hospital follow-up needs prior to discharge             -- Estimated Discharge Date:                -- Apparently patient now has a legal guardian. Placement is in progress. The patient may not have the option to go to the mother's house unless there is 24/7 supervision.  06/13/2023: LCSW, patient, and Guardian Ad Litem had a meeting yesterday regarding patient discharge 06/12/2023.  Please see social worker's notes. May have placement for next week at a group home.                -- Discharge Concerns: Need to establish a safety plan; Medication compliance and effectiveness             -- Discharge Goals: Return home with outpatient referrals for mental health follow-up including medication management/psychotherapy    I certify that inpatient services furnished can reasonably be expected to improve the patient's condition.    Margaretmary Dys, MD 07/02/2023, 3:28 PM

## 2023-07-02 NOTE — Group Note (Signed)
Recreation Therapy Group Note   Group Topic:Animal Assisted Therapy   Group Date: 07/02/2023 Start Time: 0950 End Time: 1030 Facilitators: Rocio Roam-McCall, LRT,CTRS Location: [address] Dayroom   Animal-Assisted Activity (AAA) Program Checklist/Progress Notes Patient Eligibility Criteria Checklist & Daily Group note for Rec Tx Intervention  AAA/T Program Assumption of Risk Form signed by Patient/ or Parent Legal Guardian Yes  Patient understands his/her participation is voluntary Yes  Education: Charity fundraiser, Appropriate Animal Interaction   Education Outcome: Acknowledges education.    Affect/Mood: N/A   Participation Level: Did not attend    Clinical Observations/Individualized Feedback:     Plan: Continue to engage patient in RT group sessions 2-3x/week.   Yolanda [name], LRT,CTRS 07/02/2023 1:06 PM

## 2023-07-03 ENCOUNTER — Encounter (HOSPITAL_COMMUNITY): Payer: Self-pay

## 2023-07-03 DIAGNOSIS — F251 Schizoaffective disorder, depressive type: Secondary | ICD-10-CM | POA: Diagnosis not present

## 2023-07-03 NOTE — Progress Notes (Signed)
[facility] MD Progress Note  07/03/2023 3:57 PM Yolanda [name]  MRN:  [id number] Subjective:   47 year old African-American female, single, on SSI disability, homeless.  Background history of intellectual disability, substance use disorder and schizoaffective disorder depressive type.  Presented via emergency services on account of worsening depression associated with suicidal thoughts.  Expressed thoughts of suicide by stabbing herself.  Chart reviewed today.  Patient discussed at multidisciplinary team meeting.  Staff reports that she is at baseline.  No challenging behavior.  She has been adherent with her medications.  She is eating her meals.  She is drinking enough fluids.  She is interacting appropriately with the milieu.  Seen today.  No changes in her mental state.  No side effects from her medications.  Not endorsing any violent thoughts.  Marginally down as she is spending Christmas in the hospital.  Encouraged to keep ventilating her feelings to staff.   Principal Problem: Schizoaffective disorder, depressive type (HCC) Diagnosis: Principal Problem:   Schizoaffective disorder, depressive type (HCC) Active Problems:   GERD (gastroesophageal reflux disease)   Intellectual disability   Tobacco use disorder  Total Time spent with patient: 20 minutes  Past Psychiatric History: Extensive history of mental illness.  She has been tried on multiple medications over the years.  See H&P for full details.  Past Medical History:  Past Medical History:  Diagnosis Date   Bipolar affect, depressed (HCC)    Constipation 08/17/2022   Depression    Falls 07/21/2022   Fracture of femoral neck, right, closed (HCC) 01/17/2022   Herpes simplex 08/22/2017   Open fracture dislocation of right elbow joint 01/17/2022    Past Surgical History:  Procedure Laterality Date   NO PAST SURGERIES     SALPINGECTOMY     Family History: History reviewed. No pertinent family history. Family Psychiatric   History: Extensive family history of mood disorder. Social History:  Social History   Substance and Sexual Activity  Alcohol Use Yes     Social History   Substance and Sexual Activity  Drug Use Yes   Types: Cocaine, Marijuana    Social History   Socioeconomic History   Marital status: Single    Spouse name: Not on file   Number of children: Not on file   Years of education: Not on file   Highest education level: Not on file  Occupational History   Not on file  Tobacco Use   Smoking status: Every Day   Smokeless tobacco: Not on file  Substance and Sexual Activity   Alcohol use: Yes   Drug use: Yes    Types: Cocaine, Marijuana   Sexual activity: Yes  Other Topics Concern   Not on file  Social History Narrative   Not on file   Social Drivers of Health   Financial Resource Strain: Low Risk  (09/12/2022)   Received from [facility], [facility]   Overall Financial Resource Strain (CARDIA)    Difficulty of Paying Living Expenses: Not hard at all  Food Insecurity: Patient Declined (12/20/2022)   Hunger Vital Sign    Worried About Running Out of Food in the Last Year: Patient declined    Ran Out of Food in the Last Year: Patient declined  Transportation Needs: No Transportation Needs (12/20/2022)   PRAPARE - Administrator, Civil Service (Medical): No    Lack of Transportation (Non-Medical): No  Physical Activity: Not on file  Stress: No Stress Concern Present (07/17/2022)   [facility]   [employer] of Occupational Health - Occupational Stress Questionnaire    Feeling of Stress : Not at all  Social Connections: Unknown (07/16/2022)   Received from [facility], [facility]   Social Network    Social Network: Not on file     Current Medications: Current Facility-Administered Medications  Medication Dose Route Frequency Provider Last Rate Last Admin   acetaminophen (TYLENOL) tablet 650 mg  650 mg Oral Q6H PRN  [name], NP   650 mg at 06/28/23 2100   alum & mag hydroxide-simeth (MAALOX/MYLANTA) 200-200-20 MG/5ML suspension 30 mL  30 mL Oral Q4H PRN Onuoha, Chinwendu V, NP   30 mL at 05/31/23 0801   busPIRone (BUSPAR) tablet 15 mg  15 mg Oral BID [name], NP   15 mg at 07/03/23 1610   cyanocobalamin (VITAMIN B12) injection 1,000 mcg  1,000 mcg Intramuscular Q30 days Sarita Bottom, MD   1,000 mcg at 07/03/23 1009   diphenhydrAMINE (BENADRYL) capsule 50 mg  50 mg Oral TID PRN [name], NP   50 mg at 06/03/23 1110   Or   diphenhydrAMINE (BENADRYL) injection 50 mg  50 mg Intramuscular TID PRN [name], NP       docusate sodium (COLACE) capsule 100 mg  100 mg Oral Daily Nkwenti, Tyler Aas, NP   100 mg at 07/03/23 9604   haloperidol (HALDOL) tablet 5 mg  5 mg Oral TID PRN [name], NP   5 mg at 06/03/23 1110   Or   haloperidol lactate (HALDOL) injection 5 mg  5 mg Intramuscular TID PRN [name], NP       hydrocortisone cream 1 %   Topical BID Cecilie Lowers, FNP   Given at 07/02/23 1733   hydrOXYzine (ATARAX) tablet 25 mg  25 mg Oral TID PRN [name], DO   25 mg at 07/01/23 1928   LORazepam (ATIVAN) tablet 2 mg  2 mg Oral TID PRN [name], NP   2 mg at 06/03/23 1110   Or   LORazepam (ATIVAN) injection 2 mg  2 mg Intramuscular TID PRN [name], NP       magnesium hydroxide (MILK OF MAGNESIA) suspension 30 mL  30 mL Oral Daily PRN [name], NP   30 mL at 06/23/23 1856   melatonin tablet 5 mg  5 mg Oral QHS Nkwenti, Doris, NP   5 mg at 07/02/23 2109   nicotine polacrilex (NICORETTE) gum 2 mg  2 mg Oral PRN [name], MD   2 mg at 07/03/23 1054   paliperidone (INVEGA) 24 hr tablet 6 mg  6 mg Oral Daily [name], NP   6 mg at 07/02/23 2109   pantoprazole (PROTONIX) EC tablet 40 mg  40 mg Oral Daily Nwoko, Nicole Kindred I, NP   40 mg at 07/03/23 0833   polyethylene glycol (MIRALAX / GLYCOLAX) packet 17 g  17 g Oral Daily PRN [name], MD   17 g at  06/07/23 1847   polyethylene glycol (MIRALAX / GLYCOLAX) packet 17 g  17 g Oral Daily [name], MD   17 g at 06/30/23 0815   sertraline (ZOLOFT) tablet 150 mg  150 mg Oral Daily Massengill, Harrold Donath, MD   150 mg at 07/03/23 5409   SUMAtriptan (IMITREX) tablet 25 mg  25 mg Oral BID PRN [name], NP   25 mg at 05/07/23 2112   traZODone (DESYREL) tablet 50 mg  50  mg Oral QHS [name], NP   50 mg at 07/02/23 2109   Vitamin D (Ergocalciferol) (DRISDOL) 1.25 MG (50000 UNIT) capsule 50,000 Units  50,000 Units Oral Q7 days [name], NP   50,000 Units at 06/29/23 2058    Lab Results: No results found for this or any previous visit (from the past 48 hours).  Blood Alcohol level:  Lab Results  Component Value Date   ETH <10 12/19/2022   ETH <10 11/21/2019    Metabolic Disorder Labs: Lab Results  Component Value Date   HGBA1C 5.6 06/16/2023   MPG 114.02 06/16/2023   MPG 79.58 12/21/2022   No results found for: "PROLACTIN" Lab Results  Component Value Date   CHOL 256 (H) 06/16/2023   TRIG 224 (H) 06/16/2023   HDL 83 06/16/2023   CHOLHDL 3.1 06/16/2023   VLDL 45 (H) 06/16/2023   LDLCALC 128 (H) 06/16/2023   LDLCALC 149 (H) 12/21/2022    Physical Findings: AIMS: Facial and Oral Movements Muscles of Facial Expression: None Lips and Perioral Area: None Jaw: None Tongue: None,Extremity Movements Upper (arms, wrists, hands, fingers): None Lower (legs, knees, ankles, toes): None, Trunk Movements Neck, shoulders, hips: None, Global Judgements Severity of abnormal movements overall : None Incapacitation due to abnormal movements: None Patient's awareness of abnormal movements: No Awareness, Dental Status Current problems with teeth and/or dentures?: No Does patient usually wear dentures?: No  CIWA:    COWS:     Musculoskeletal: Strength & Muscle Tone: within normal limits Gait & Station: normal Patient leans: N/A  Psychiatric Specialty Exam:  Presentation   General Appearance:  Casually dressed, overweight, not in any distress.  No EPS. Eye Contact: Good  Speech: Slurred and slow.  Speech Volume: At baseline.  Mood and Affect  Mood: Depressed but not pervasively so. Affect: Blunted and mood congruent.  Thought Process  Thought Processes: Decreased speed of thoughts but goal-directed.  Descriptions of Associations:  Linear.  Orientation:Full (Time, Place and Person)  Thought Content: Negative ruminations about early life events.  Negative rumination about being in the hospital during the holidays.  No active suicidal thoughts.  No homicidal thoughts.  No thoughts of violence.  No delusional preoccupation.   Hallucinations: No hallucination in any modality.   Sensorium  Memory: Immediate Fair; Recent Good; Remote Good  Judgment: Fair  Insight: Shallow   Executive Functions  Concentration: Fair  Attention Span: Fair  Recall: Fair  Fund of Knowledge: Fair  Language: Fair   Psychomotor Activity  Psychomotor Activity: Psychomotor Activity: Normal     Physical Exam: Physical Exam ROS Blood pressure 106/73, pulse 88, temperature 98 F (36.7 C), resp. rate 16, height 4\' 11"  (1.499 m), weight 63 kg, SpO2 100%. Body mass index is 28.05 kg/m.   Treatment Plan Summary: Patient is at her baseline.  She is tolerating her medications well.  There is no dangerousness.  She is stable for care as soon as we finalize safe and appropriate disposition.  We will continue her medications and evaluate her further.  1.  Continue Invega ER 6 mg at bedtime. 2.  Continue sertraline 150 mg daily. 3.  Continue trazodone 50 mg as needed for insomnia. 4.  Continue to monitor mood behavior and interaction with others. 5.  Continue to encourage unit groups and therapeutic activities.   Georgiann Cocker, MD 07/03/2023, 3:57 PM

## 2023-07-03 NOTE — Progress Notes (Signed)
[facility] MD Progress Note  07/03/2023 3:49 PM Yolanda [name]  MRN:  [id number] Subjective:   47 year old African-American female, single, on SSI disability, homeless.  Background history of intellectual disability, substance use disorder and schizoaffective disorder depressive type.  Presented via emergency services on account of worsening depression associated with suicidal thoughts.  Expressed thoughts of suicide by stabbing herself.  Chart reviewed today.  Patient has been in the hospital over an extensive period of time.  We are trying to get appropriate disposition for her.  Nursing staff reports that she has been at her baseline.  No challenging behavior.  She has been adherent with her medications.  Seen today.  Patient tells me that she does wish that she had finalized everything and out of the hospital.  Tells me that she does miss her family especially this time of the year.  She has been staying busy by studying in her room.  States that she plans to take her GED soon.  She is not endorsing any hallucinations.  She is not endorsing any delusions.  She reports fleeting feelings of hopelessness or worthlessness.  No active suicidal thoughts.  No side effects from her medications.  Encouraged to keep ventilating her feelings to staff.   Principal Problem: Schizoaffective disorder, depressive type (HCC) Diagnosis: Principal Problem:   Schizoaffective disorder, depressive type (HCC) Active Problems:   GERD (gastroesophageal reflux disease)   Intellectual disability   Tobacco use disorder  Total Time spent with patient: 20 minutes  Past Psychiatric History: Extensive history of mental illness.  She has been tried on multiple medications over the years.  See H&P for full details.  Past Medical History:  Past Medical History:  Diagnosis Date   Bipolar affect, depressed (HCC)    Constipation 08/17/2022   Depression    Falls 07/21/2022   Fracture of femoral neck, right, closed  (HCC) 01/17/2022   Herpes simplex 08/22/2017   Open fracture dislocation of right elbow joint 01/17/2022    Past Surgical History:  Procedure Laterality Date   NO PAST SURGERIES     SALPINGECTOMY     Family History: History reviewed. No pertinent family history. Family Psychiatric  History: Extensive family history of mood disorder. Social History:  Social History   Substance and Sexual Activity  Alcohol Use Yes     Social History   Substance and Sexual Activity  Drug Use Yes   Types: Cocaine, Marijuana    Social History   Socioeconomic History   Marital status: Single    Spouse name: Not on file   Number of children: Not on file   Years of education: Not on file   Highest education level: Not on file  Occupational History   Not on file  Tobacco Use   Smoking status: Every Day   Smokeless tobacco: Not on file  Substance and Sexual Activity   Alcohol use: Yes   Drug use: Yes    Types: Cocaine, Marijuana   Sexual activity: Yes  Other Topics Concern   Not on file  Social History Narrative   Not on file   Social Drivers of Health   Financial Resource Strain: Low Risk  (09/12/2022)   Received from [facility]   Overall Financial Resource Strain (CARDIA)    Difficulty of Paying Living Expenses: Not hard at all  Food Insecurity: Patient Declined (12/20/2022)   Hunger Vital Sign    Worried About Running Out of Food in the Last Year: Patient declined  Ran Out of Food in the Last Year: Patient declined  Transportation Needs: No Transportation Needs (12/20/2022)   PRAPARE - Administrator, Civil Service (Medical): No    Lack of Transportation (Non-Medical): No  Physical Activity: Not on file  Stress: No Stress Concern Present (07/17/2022)   Received from [facility], [facility] of Occupational Health - Occupational Stress Questionnaire    Feeling of Stress : Not at all  Social Connections: Unknown (07/16/2022)    Received from [facility], [facility]   Social Network    Social Network: Not on file     Current Medications: Current Facility-Administered Medications  Medication Dose Route Frequency Provider Last Rate Last Admin   acetaminophen (TYLENOL) tablet 650 mg  650 mg Oral Q6H PRN [name], NP   650 mg at 06/28/23 2100   alum & mag hydroxide-simeth (MAALOX/MYLANTA) 200-200-20 MG/5ML suspension 30 mL  30 mL Oral Q4H PRN Onuoha, Chinwendu V, NP   30 mL at 05/31/23 0801   busPIRone (BUSPAR) tablet 15 mg  15 mg Oral BID [name], NP   15 mg at 07/03/23 1610   cyanocobalamin (VITAMIN B12) injection 1,000 mcg  1,000 mcg Intramuscular Q30 days Sarita Bottom, MD   1,000 mcg at 07/03/23 1009   diphenhydrAMINE (BENADRYL) capsule 50 mg  50 mg Oral TID PRN [name], NP   50 mg at 06/03/23 1110   Or   diphenhydrAMINE (BENADRYL) injection 50 mg  50 mg Intramuscular TID PRN [name], NP       docusate sodium (COLACE) capsule 100 mg  100 mg Oral Daily Nkwenti, Tyler Aas, NP   100 mg at 07/03/23 9604   haloperidol (HALDOL) tablet 5 mg  5 mg Oral TID PRN [name], NP   5 mg at 06/03/23 1110   Or   haloperidol lactate (HALDOL) injection 5 mg  5 mg Intramuscular TID PRN [name], NP       hydrocortisone cream 1 %   Topical BID Cecilie Lowers, FNP   Given at 07/02/23 1733   hydrOXYzine (ATARAX) tablet 25 mg  25 mg Oral TID PRN [name], DO   25 mg at 07/01/23 1928   LORazepam (ATIVAN) tablet 2 mg  2 mg Oral TID PRN [name], NP   2 mg at 06/03/23 1110   Or   LORazepam (ATIVAN) injection 2 mg  2 mg Intramuscular TID PRN [name], NP       magnesium hydroxide (MILK OF MAGNESIA) suspension 30 mL  30 mL Oral Daily PRN [name], NP   30 mL at 06/23/23 1856   melatonin tablet 5 mg  5 mg Oral QHS Nkwenti, Doris, NP   5 mg at 07/02/23 2109   nicotine polacrilex (NICORETTE) gum 2 mg  2 mg Oral PRN [name], MD   2 mg at 07/03/23 1054   paliperidone (INVEGA) 24 hr  tablet 6 mg  6 mg Oral Daily [name], NP   6 mg at 07/02/23 2109   pantoprazole (PROTONIX) EC tablet 40 mg  40 mg Oral Daily Nwoko, Nicole Kindred I, NP   40 mg at 07/03/23 0833   polyethylene glycol (MIRALAX / GLYCOLAX) packet 17 g  17 g Oral Daily PRN [name], MD   17 g at 06/07/23 1847   polyethylene glycol (MIRALAX / GLYCOLAX) packet 17 g  17 g Oral Daily [name], MD   17 g at 06/30/23  0815   sertraline (ZOLOFT) tablet 150 mg  150 mg Oral Daily Massengill, Nathan, MD   150 mg at 07/03/23 8119   SUMAtriptan (IMITREX) tablet 25 mg  25 mg Oral BID PRN [name], NP   25 mg at 05/07/23 2112   traZODone (DESYREL) tablet 50 mg  50 mg Oral QHS [name], NP   50 mg at 07/02/23 2109   Vitamin D (Ergocalciferol) (DRISDOL) 1.25 MG (50000 UNIT) capsule 50,000 Units  50,000 Units Oral Q7 days [name], NP   50,000 Units at 06/29/23 2058    Lab Results: No results found for this or any previous visit (from the past 48 hours).  Blood Alcohol level:  Lab Results  Component Value Date   ETH <10 12/19/2022   ETH <10 11/21/2019    Metabolic Disorder Labs: Lab Results  Component Value Date   HGBA1C 5.6 06/16/2023   MPG 114.02 06/16/2023   MPG 79.58 12/21/2022   No results found for: "PROLACTIN" Lab Results  Component Value Date   CHOL 256 (H) 06/16/2023   TRIG 224 (H) 06/16/2023   HDL 83 06/16/2023   CHOLHDL 3.1 06/16/2023   VLDL 45 (H) 06/16/2023   LDLCALC 128 (H) 06/16/2023   LDLCALC 149 (H) 12/21/2022    Physical Findings: AIMS: Facial and Oral Movements Muscles of Facial Expression: None Lips and Perioral Area: None Jaw: None Tongue: None,Extremity Movements Upper (arms, wrists, hands, fingers): None Lower (legs, knees, ankles, toes): None, Trunk Movements Neck, shoulders, hips: None, Global Judgements Severity of abnormal movements overall : None Incapacitation due to abnormal movements: None Patient's awareness of abnormal movements: No  Awareness, Dental Status Current problems with teeth and/or dentures?: No Does patient usually wear dentures?: No  CIWA:    COWS:     Musculoskeletal: Strength & Muscle Tone: within normal limits Gait & Station: normal Patient leans: N/A  Psychiatric Specialty Exam:  Presentation  General Appearance:  Appropriate for Environment; Casual  Eye Contact: Good  Speech: Clear and Coherent  Speech Volume: Normal  Handedness: Right   Mood and Affect  Mood: Dysphoric  Affect: Congruent   Thought Process  Thought Processes: Coherent  Descriptions of Associations:Circumstantial  Orientation:Full (Time, Place and Person)  Thought Content:Rumination; Tangential  History of Schizophrenia/Schizoaffective disorder:Yes  Duration of Psychotic Symptoms:Greater than six months  Hallucinations:Hallucinations: Auditory; Command Description of Command Hallucinations: pt reports command hallucinations telling her to harm herself.  Ideas of Reference:None  Suicidal Thoughts:Suicidal Thoughts: No  Homicidal Thoughts:Homicidal Thoughts: No   Sensorium  Memory: Immediate Fair; Recent Good; Remote Good  Judgment: Fair  Insight: Shallow   Executive Functions  Concentration: Fair  Attention Span: Fair  Recall: Fair  Fund of Knowledge: Fair  Language: Fair   Psychomotor Activity  Psychomotor Activity: Psychomotor Activity: Normal   Assets  Assets: Resilience   Sleep  Sleep: Sleep: Fair    Physical Exam: Physical Exam ROS Blood pressure 106/73, pulse 88, temperature 98 F (36.7 C), resp. rate 16, height 4\' 11"  (1.499 m), weight 63 kg, SpO2 100%. Body mass index is 28.05 kg/m.   Treatment Plan Summary: Patient is at her baseline.  She is tolerating her medications well.  There is no dangerousness.  She is stable for care as soon as we finalize safe and appropriate disposition.  We will continue her medications and evaluate her  further.  1.  Continue Invega ER 6 mg at bedtime. 2.  Continue sertraline 150 mg daily. 3.  Continue trazodone 50 mg  as needed for insomnia. 4.  Continue to monitor mood behavior and interaction with others. 5.  Continue to encourage unit groups and therapeutic activities.   Georgiann Cocker, MD 07/03/2023, 3:49 PM

## 2023-07-03 NOTE — Plan of Care (Signed)
  Problem: Coping: Goal: Coping ability will improve Outcome: Progressing   Problem: Medication: Goal: Compliance with prescribed medication regimen will improve Outcome: Progressing   

## 2023-07-03 NOTE — Progress Notes (Signed)
   07/03/23 0526  15 Minute Checks  Location Bedroom  Visual Appearance Calm  Behavior Sleeping  Sleep (Behavioral Health Patients Only)  Calculate sleep? (Click Yes once per 24 hr at 0600 safety check) Yes  Documented sleep last 24 hours 9

## 2023-07-03 NOTE — BH IP Treatment Plan (Signed)
Interdisciplinary Treatment and Diagnostic Plan Update  07/03/2023 Time of Session: 12:30 PM - UPDATE Yolanda [name] MRN: [id number]  Principal Diagnosis: Schizoaffective disorder, depressive type (HCC)  Secondary Diagnoses: Principal Problem:   Schizoaffective disorder, depressive type (HCC) Active Problems:   GERD (gastroesophageal reflux disease)   Intellectual disability   Tobacco use disorder   Current Medications:  Current Facility-Administered Medications  Medication Dose Route Frequency Provider Last Rate Last Admin   acetaminophen (TYLENOL) tablet 650 mg  650 mg Oral Q6H PRN [name], NP   650 mg at 06/28/23 2100   alum & mag hydroxide-simeth (MAALOX/MYLANTA) 200-200-20 MG/5ML suspension 30 mL  30 mL Oral Q4H PRN Onuoha, Chinwendu V, NP   30 mL at 05/31/23 0801   busPIRone (BUSPAR) tablet 15 mg  15 mg Oral BID [name], NP   15 mg at 07/03/23 7564   cyanocobalamin (VITAMIN B12) injection 1,000 mcg  1,000 mcg Intramuscular Q30 days Sarita Bottom, MD   1,000 mcg at 07/03/23 1009   diphenhydrAMINE (BENADRYL) capsule 50 mg  50 mg Oral TID PRN [name], NP   50 mg at 06/03/23 1110   Or   diphenhydrAMINE (BENADRYL) injection 50 mg  50 mg Intramuscular TID PRN [name], NP       docusate sodium (COLACE) capsule 100 mg  100 mg Oral Daily Nkwenti, Tyler Aas, NP   100 mg at 07/03/23 3329   haloperidol (HALDOL) tablet 5 mg  5 mg Oral TID PRN [name], NP   5 mg at 06/03/23 1110   Or   haloperidol lactate (HALDOL) injection 5 mg  5 mg Intramuscular TID PRN [name], NP       hydrocortisone cream 1 %   Topical BID Cecilie Lowers, FNP   Given at 07/02/23 1733   hydrOXYzine (ATARAX) tablet 25 mg  25 mg Oral TID PRN [name], DO   25 mg at 07/01/23 1928   LORazepam (ATIVAN) tablet 2 mg  2 mg Oral TID PRN [name], NP   2 mg at 06/03/23 1110   Or   LORazepam (ATIVAN) injection 2 mg  2 mg Intramuscular TID PRN [name], NP       magnesium  hydroxide (MILK OF MAGNESIA) suspension 30 mL  30 mL Oral Daily PRN [name], NP   30 mL at 06/23/23 1856   melatonin tablet 5 mg  5 mg Oral QHS Nkwenti, Doris, NP   5 mg at 07/02/23 2109   nicotine polacrilex (NICORETTE) gum 2 mg  2 mg Oral PRN [name], MD   2 mg at 07/03/23 1054   paliperidone (INVEGA) 24 hr tablet 6 mg  6 mg Oral Daily [name], NP   6 mg at 07/02/23 2109   pantoprazole (PROTONIX) EC tablet 40 mg  40 mg Oral Daily Nwoko, Nicole Kindred I, NP   40 mg at 07/03/23 0833   polyethylene glycol (MIRALAX / GLYCOLAX) packet 17 g  17 g Oral Daily PRN [name], MD   17 g at 06/07/23 1847   polyethylene glycol (MIRALAX / GLYCOLAX) packet 17 g  17 g Oral Daily [name], MD   17 g at 06/30/23 0815   sertraline (ZOLOFT) tablet 150 mg  150 mg Oral Daily Massengill, Harrold Donath, MD   150 mg at 07/03/23 5188   SUMAtriptan (IMITREX) tablet 25 mg  25 mg Oral BID PRN [name], NP   25 mg at 05/07/23 2112   traZODone (DESYREL)  tablet 50 mg  50 mg Oral QHS [name], NP   50 mg at 07/02/23 2109   Vitamin D (Ergocalciferol) (DRISDOL) 1.25 MG (50000 UNIT) capsule 50,000 Units  50,000 Units Oral Q7 days [name], NP   50,000 Units at 06/29/23 2058   PTA Medications: Medications Prior to Admission  Medication Sig Dispense Refill Last Dose/Taking   busPIRone (BUSPAR) 15 MG tablet Take 15 mg by mouth 2 (two) times daily. (Patient not taking: Reported on 12/19/2022)      paliperidone (INVEGA SUSTENNA) 156 MG/ML SUSY injection Inject 156 mg into the muscle once. (Patient not taking: Reported on 12/19/2022)      sertraline (ZOLOFT) 50 MG tablet Take 150 mg by mouth daily. (Patient not taking: Reported on 12/19/2022)      traZODone (DESYREL) 100 MG tablet Take 100 mg by mouth at bedtime as needed for sleep. (Patient not taking: Reported on 11/12/2022)       Patient Stressors: Medication change or noncompliance    Patient Strengths: Forensic psychologist fund of  knowledge   Treatment Modalities: Medication Management, Group therapy, Case management,  1 to 1 session with clinician, Psychoeducation, Recreational therapy.   Physician Treatment Plan for Primary Diagnosis: Schizoaffective disorder, depressive type (HCC) Long Term Goal(s): Improvement in symptoms so as ready for discharge   Short Term Goals: Ability to identify changes in lifestyle to reduce recurrence of condition will improve Ability to verbalize feelings will improve  Medication Management: Evaluate patient's response, side effects, and tolerance of medication regimen.  Therapeutic Interventions: 1 to 1 sessions, Unit Group sessions and Medication administration.  Evaluation of Outcomes: Progressing  Physician Treatment Plan for Secondary Diagnosis: Principal Problem:   Schizoaffective disorder, depressive type (HCC) Active Problems:   GERD (gastroesophageal reflux disease)   Intellectual disability   Tobacco use disorder  Long Term Goal(s): Improvement in symptoms so as ready for discharge   Short Term Goals: Ability to identify changes in lifestyle to reduce recurrence of condition will improve Ability to verbalize feelings will improve     Medication Management: Evaluate patient's response, side effects, and tolerance of medication regimen.  Therapeutic Interventions: 1 to 1 sessions, Unit Group sessions and Medication administration.  Evaluation of Outcomes: Progressing   RN Treatment Plan for Primary Diagnosis: Schizoaffective disorder, depressive type (HCC) Long Term Goal(s): Knowledge of disease and therapeutic regimen to maintain health will improve  Short Term Goals: Ability to remain free from injury will improve, Ability to verbalize frustration and anger appropriately will improve, Ability to participate in decision making will improve, Ability to verbalize feelings will improve, Ability to identify and develop effective coping behaviors will improve, and  Compliance with prescribed medications will improve  Medication Management: RN will administer medications as ordered by provider, will assess and evaluate patient's response and provide education to patient for prescribed medication. RN will report any adverse and/or side effects to prescribing provider.  Therapeutic Interventions: 1 on 1 counseling sessions, Psychoeducation, Medication administration, Evaluate responses to treatment, Monitor vital signs and CBGs as ordered, Perform/monitor CIWA, COWS, AIMS and Fall Risk screenings as ordered, Perform wound care treatments as ordered.  Evaluation of Outcomes: Progressing   LCSW Treatment Plan for Primary Diagnosis: Schizoaffective disorder, depressive type (HCC) Long Term Goal(s): Safe transition to appropriate next level of care at discharge, Engage patient in therapeutic group addressing interpersonal concerns.  Short Term Goals: Engage patient in aftercare planning with referrals and resources, Increase social support, Increase ability to appropriately verbalize feelings,  Facilitate acceptance of mental health diagnosis and concerns, Facilitate patient progression through stages of change regarding substance use diagnoses and concerns, and Increase skills for wellness and recovery  Therapeutic Interventions: Assess for all discharge needs, 1 to 1 time with Social worker, Explore available resources and support systems, Assess for adequacy in community support network, Educate family and significant other(s) on suicide prevention, Complete Psychosocial Assessment, Interpersonal group therapy.  Evaluation of Outcomes: Progressing   PrProgress in Treatment: Attending groups: No Participating in groups: No Taking medication as prescribed: Yes. Toleration medication: Yes. Family/Significant other contact made: Yes, individual(s) contacted:  Annalisha Bezdek [phone] Patient understands diagnosis: Yes. Discussing patient identified  problems/goals with staff: Yes. Medical problems stabilized or resolved: Yes. Denies suicidal/homicidal ideation: Yes. Issues/concerns per patient self-inventory: No.   New problem(s) identified: none reported   New Short Term/Long Term Goal(s): medication stabilization, elimination of SI thoughts, development of comprehensive mental wellness plan.      Patient Goals:  " placement"   Discharge Plan or Barriers: Patient recently admitted. CSW will continue to follow and assess for appropriate referrals and possible discharge planning.      Reason for Continuation of Hospitalization: Depression Suicidal ideation   Estimated Length of Stay: Pt still expected to discharge  to group home awaiting  Single Case agreement from [facility]. Last 3 [place] Suicide Severity Risk Score: Flowsheet Row Ad[facility] (Current) [facility] from [facility]19/2022 [facility] at [facility] from [facility]/12/2022 in [facility] at [facility]  C-SSRS RISK CATEGORY High Risk High Risk Moderate Risk       Last [facility] 2/9 Scores:     No data to display          Scribe for Treatment Team: Alla Feeling, LCSW-A 07/03/2023 3:22 PM

## 2023-07-03 NOTE — Plan of Care (Signed)
  Problem: Education: Goal: Ability to make informed decisions regarding treatment will improve Outcome: Progressing   Problem: Health Behavior/Discharge Planning: Goal: Identification of resources available to assist in meeting health care needs will improve Outcome: Progressing   Problem: Self-Concept: Goal: Ability to disclose and discuss suicidal ideas will improve Outcome: Progressing   Problem: Education: Goal: Knowledge of [facility] Education information/materials will improve Outcome: Progressing Goal: Emotional status will improve Outcome: Progressing   Problem: Activity: Goal: Interest or engagement in activities will improve Outcome: Progressing

## 2023-07-03 NOTE — Progress Notes (Signed)
   07/03/23 1000  Psych Admission Type (Psych Patients Only)  Admission Status Involuntary  Psychosocial Assessment  Patient Complaints Depression  Eye Contact Fair  Facial Expression Sad  Affect Depressed  Speech Logical/coherent  Interaction Assertive;Childlike  Motor Activity Slow  Appearance/Hygiene Unremarkable  Behavior Characteristics Calm;Cooperative  Mood Sad;Pleasant  Thought Process  Coherency WDL  Content WDL  Delusions None reported or observed  Perception WDL  Hallucination None reported or observed  Judgment Limited  Confusion None  Danger to Self  Current suicidal ideation? Denies  Agreement Not to Harm Self Yes  Description of Agreement verbal  Danger to Others  Danger to Others None reported or observed

## 2023-07-04 DIAGNOSIS — F251 Schizoaffective disorder, depressive type: Secondary | ICD-10-CM | POA: Diagnosis not present

## 2023-07-04 NOTE — Group Note (Signed)
Date:  07/04/2023 Time:  2:51 PM  Group Topic/Focus:  Goals Group:   The focus of this group is to help patients establish daily goals to achieve during treatment and discuss how the patient can incorporate goal setting into their daily lives to aide in recovery. Orientation:   The focus of this group is to educate the patient on the purpose and policies of crisis stabilization and provide a format to answer questions about their admission.  The group details unit policies and expectations of patients while admitted.    Participation Level:  Did Not Attend   [name] 07/04/2023, 2:51 PM

## 2023-07-04 NOTE — Progress Notes (Signed)
   07/04/23 0900  Psych Admission Type (Psych Patients Only)  Admission Status Involuntary  Psychosocial Assessment  Patient Complaints None  Eye Contact Brief  Facial Expression Flat;Sad  Affect Depressed  Speech Logical/coherent  Interaction Assertive;Childlike  Motor Activity Slow  Appearance/Hygiene Disheveled  Behavior Characteristics Cooperative;Calm  Mood Pleasant;Sad  Thought Process  Coherency WDL  Content WDL  Delusions None reported or observed  Perception WDL  Hallucination None reported or observed  Judgment Limited  Confusion None  Danger to Self  Current suicidal ideation? Denies  Agreement Not to Harm Self Yes  Description of Agreement verbal  Danger to Others  Danger to Others None reported or observed

## 2023-07-04 NOTE — Group Note (Signed)
Date:  07/04/2023 Time:  2:42 PM  Group Topic/Focus:  Coping With Mental Health Crisis:   The purpose of this group is to help patients identify strategies for coping with mental health crisis.  Group discusses possible causes of crisis and ways to manage them effectively.    Participation Level:  Did Not Attend   [name] 07/04/2023, 2:42 PM

## 2023-07-04 NOTE — Plan of Care (Signed)
  Problem: Education: Goal: Ability to make informed decisions regarding treatment will improve Outcome: Progressing   Problem: Health Behavior/Discharge Planning: Goal: Identification of resources available to assist in meeting health care needs will improve Outcome: Progressing   Problem: Self-Concept: Goal: Ability to disclose and discuss suicidal ideas will improve Outcome: Progressing   Problem: Education: Goal: Knowledge of [place] [facility] Education information/materials will improve Outcome: Progressing Goal: Mental status will improve Outcome: Progressing   Problem: Education: Goal: Mental status will improve Outcome: Progressing   Problem: Activity: Goal: Sleeping patterns will improve Outcome: Progressing

## 2023-07-04 NOTE — Group Note (Signed)
LCSW Group Therapy Note   Group Date: 07/04/2023 Start Time: 1100 End Time: 1200   Type of Therapy and Topic:  Group Therapy - Who Am I?   Participation Level:  Did Not Attend   Description of Group:  The focus of this group was to aid patients in self-exploration and awareness. Patients were guided in exploring various factors of oneself to include interests, readiness to change, management of emotions, and individual perception of self. Patients were provided with complementary worksheets exploring hidden talents, ease of asking other for help, music/media preferences, understanding and responding to feelings/emotions, and hope for the future. At group closing, patients were encouraged to adhere to discharge plan to assist in continued self-exploration and understanding.   Therapeutic Goals: 1. Patients learned that self-exploration and awareness is an ongoing process  2. Patients identified their individual skills, preferences, and abilities  3. Patients explored their openness to establish and confide in supports  4. Patients explored their readiness for change and progression of mental health   Summary of Patient Progress:   Did not attend  Therapeutic Modalities: Cognitive Behavioral Therapy  Motivational Interviewing    Alla Feeling, LCSWA 07/04/2023  12:29 PM

## 2023-07-04 NOTE — Progress Notes (Signed)
[facility] MD Progress Note  07/04/2023 10:45 AM Euline GAVYN SCIBETTA  MRN:  [id number]  Reason for admission: 47 year old African-American female, single, on SSI disability, homeless.  Background history of intellectual disability, substance use disorder and schizoaffective disorder depressive type.  Presented via emergency services on account of worsening depression associated with suicidal thoughts.  Expressed thoughts of suicide by stabbing herself.  Chart reviewed today.  Patient discussed at multidisciplinary team meeting.  Daily notes: Clarence is seen in her room. Chart reviewed. The chart findings discussed with the treatment team. She is lying down in bed. She presents alert, but sleepy. However, she is able to make needs known. She is making a fair eye contact. She reports, "I'm okay, just feeling sleepy. I did not go to breakfast this morning because I was not hungry. But I will go to lunch later on today". Hamda denies any new issues, however, continues to hope for a discharge into a group home  setting anytime soon. Her group home placement is in process at this time. She is taking & tolerating her treatment regimen. Denies any side effects. Salvador do attend group sessions, usually towards the afternoon after lunch. She currently denies any SIHI, AVH, delusional thoughts or paranoia. She does not appear to be responding to any internal stimuli. The SW working on patient's case to place her in a group home setting states that we are still waiting to hear from one group home who has shown some interest in accepting Rayleen into their group home setting. At this time, Bali is being provided with support & encouragement while she awaits to hear from the group home. There are no behavioral issues documented by staff. Her vital signs remain stable. There are no new lab results. There are no changes made on the current plan of care. Continue as already in progress.  Principal Problem:  Schizoaffective disorder, depressive type (HCC)  Diagnosis: Principal Problem:   Schizoaffective disorder, depressive type (HCC) Active Problems:   GERD (gastroesophageal reflux disease)   Intellectual disability   Tobacco use disorder  Total Time spent with patient: 20 minutes  Past Psychiatric History: Extensive history of mental illness.  She has been tried on multiple medications over the years.  See H&P for full details.  Past Medical History:  Past Medical History:  Diagnosis Date   Bipolar affect, depressed (HCC)    Constipation 08/17/2022   Depression    Falls 07/21/2022   Fracture of femoral neck, right, closed (HCC) 01/17/2022   Herpes simplex 08/22/2017   Open fracture dislocation of right elbow joint 01/17/2022    Past Surgical History:  Procedure Laterality Date   NO PAST SURGERIES     SALPINGECTOMY     Family History: History reviewed. No pertinent family history.  Family Psychiatric  History: Extensive family history of mood disorder.  Social History:  Social History   Substance and Sexual Activity  Alcohol Use Yes     Social History   Substance and Sexual Activity  Drug Use Yes   Types: Cocaine, Marijuana    Social History   Socioeconomic History   Marital status: Single    Spouse name: Not on file   Number of children: Not on file   Years of education: Not on file   Highest education level: Not on file  Occupational History   Not on file  Tobacco Use   Smoking status: Every Day   Smokeless tobacco: Not on file  Substance and Sexual Activity   Alcohol  use: Yes   Drug use: Yes    Types: Cocaine, Marijuana   Sexual activity: Yes  Other Topics Concern   Not on file  Social History Narrative   Not on file   Social Drivers of Health   Financial Resource Strain: Low Risk  (09/12/2022)   Received from [facility]   Overall Financial Resource Strain (CARDIA)    Difficulty of Paying Living Expenses: Not hard at all  Food  Insecurity: Patient Declined (12/20/2022)   Hunger Vital Sign    Worried About Running Out of Food in the Last Year: Patient declined    Ran Out of Food in the Last Year: Patient declined  Transportation Needs: No Transportation Needs (12/20/2022)   PRAPARE - Administrator, Civil Service (Medical): No    Lack of Transportation (Non-Medical): No  Physical Activity: Not on file  Stress: No Stress Concern Present (07/17/2022)   Received from [facility] - Occupational Stress Questionnaire    Feeling of Stress : Not at all  Social Connections: Unknown (07/16/2022)   Received from [facility]   Social Network    Social Network: Not on file   Current Medications: Current Facility-Administered Medications  Medication Dose Route Frequency Provider Last Rate Last Admin   acetaminophen (TYLENOL) tablet 650 mg  650 mg Oral Q6H PRN [name], NP   650 mg at 06/28/23 2100   alum & mag hydroxide-simeth (MAALOX/MYLANTA) 200-200-20 MG/5ML suspension 30 mL  30 mL Oral Q4H PRN Onuoha, Chinwendu V, NP   30 mL at 05/31/23 0801   busPIRone (BUSPAR) tablet 15 mg  15 mg Oral BID [name], NP   15 mg at 07/04/23 6962   cyanocobalamin (VITAMIN B12) injection 1,000 mcg  1,000 mcg Intramuscular Q30 days Sarita Bottom, MD   1,000 mcg at 07/03/23 1009   diphenhydrAMINE (BENADRYL) capsule 50 mg  50 mg Oral TID PRN [name], NP   50 mg at 06/03/23 1110   Or   diphenhydrAMINE (BENADRYL) injection 50 mg  50 mg Intramuscular TID PRN [name], NP       docusate sodium (COLACE) capsule 100 mg  100 mg Oral Daily Nkwenti, Tyler Aas, NP   100 mg at 07/04/23 9528   haloperidol (HALDOL) tablet 5 mg  5 mg Oral TID PRN [name], NP   5 mg at 06/03/23 1110   Or   haloperidol lactate (HALDOL) injection 5 mg  5 mg Intramuscular TID PRN [name], NP       hydrocortisone cream 1 %   Topical BID Cecilie Lowers, FNP   Given  at 07/02/23 1733   hydrOXYzine (ATARAX) tablet 25 mg  25 mg Oral TID PRN [name], DO   25 mg at 07/01/23 1928   LORazepam (ATIVAN) tablet 2 mg  2 mg Oral TID PRN [name], NP   2 mg at 06/03/23 1110   Or   LORazepam (ATIVAN) injection 2 mg  2 mg Intramuscular TID PRN [name], NP       magnesium hydroxide (MILK OF MAGNESIA) suspension 30 mL  30 mL Oral Daily PRN [name], NP   30 mL at 06/23/23 1856   melatonin tablet 5 mg  5 mg Oral QHS Nkwenti, Doris, NP   5 mg at 07/03/23 2111   nicotine polacrilex (NICORETTE) gum 2 mg  2 mg Oral PRN [name], MD  2 mg at 07/03/23 1054   paliperidone (INVEGA) 24 hr tablet 6 mg  6 mg Oral Daily Nkwenti, Tyler Aas, NP   6 mg at 07/03/23 2111   pantoprazole (PROTONIX) EC tablet 40 mg  40 mg Oral Daily Cataleia Gade, Nicole Kindred I, NP   40 mg at 07/04/23 0733   polyethylene glycol (MIRALAX / GLYCOLAX) packet 17 g  17 g Oral Daily PRN [name], MD   17 g at 06/07/23 1847   polyethylene glycol (MIRALAX / GLYCOLAX) packet 17 g  17 g Oral Daily [name], MD   17 g at 06/30/23 0815   sertraline (ZOLOFT) tablet 150 mg  150 mg Oral Daily Massengill, Harrold Donath, MD   150 mg at 07/04/23 0734   SUMAtriptan (IMITREX) tablet 25 mg  25 mg Oral BID PRN [name], NP   25 mg at 05/07/23 2112   traZODone (DESYREL) tablet 50 mg  50 mg Oral QHS [name], NP   50 mg at 07/03/23 2111   Vitamin D (Ergocalciferol) (DRISDOL) 1.25 MG (50000 UNIT) capsule 50,000 Units  50,000 Units Oral Q7 days [name], NP   50,000 Units at 06/29/23 2058   Lab Results: No results found for this or any previous visit (from the past 48 hours).  Blood Alcohol level:  Lab Results  Component Value Date   ETH <10 12/19/2022   ETH <10 11/21/2019   Metabolic Disorder Labs: Lab Results  Component Value Date   HGBA1C 5.6 06/16/2023   MPG 114.02 06/16/2023   MPG 79.58 12/21/2022   No results found for: "PROLACTIN" Lab Results  Component Value Date   CHOL 256  (H) 06/16/2023   TRIG 224 (H) 06/16/2023   HDL 83 06/16/2023   CHOLHDL 3.1 06/16/2023   VLDL 45 (H) 06/16/2023   LDLCALC 128 (H) 06/16/2023   LDLCALC 149 (H) 12/21/2022   Physical Findings: AIMS: Facial and Oral Movements Muscles of Facial Expression: None Lips and Perioral Area: None Jaw: None Tongue: None,Extremity Movements Upper (arms, wrists, hands, fingers): None Lower (legs, knees, ankles, toes): None, Trunk Movements Neck, shoulders, hips: None, Global Judgements Severity of abnormal movements overall : None Incapacitation due to abnormal movements: None Patient's awareness of abnormal movements: No Awareness, Dental Status Current problems with teeth and/or dentures?: No Does patient usually wear dentures?: No  CIWA:    COWS:     Musculoskeletal: Strength & Muscle Tone: within normal limits Gait & Station: normal Patient leans: N/A  Psychiatric Specialty Exam:  Presentation  General Appearance:  Casually dressed, overweight, not in any distress.  No EPS. Eye Contact: Good  Speech: Slurred and slow.  Speech Volume: At baseline.  Mood and Affect  Mood: Depressed but not pervasively so. Affect: Blunted and mood congruent.  Thought Process  Thought Processes: Decreased speed of thoughts but goal-directed.  Descriptions of Associations:  Linear.  Orientation:Full (Time, Place and Person)  Thought Content: Negative ruminations about early life events.  Negative rumination about being in the hospital during the holidays.  No active suicidal thoughts.  No homicidal thoughts.  No thoughts of violence.  No delusional preoccupation.   Hallucinations: No hallucination in any modality.  Sensorium  Memory: Immediate Fair; Recent Good; Remote Good  Judgment: Fair  Insight: Shallow  Executive Functions  Concentration: Fair  Attention Span: Fair  Recall: Fair  Fund of Knowledge: Fair  Language: Fair  Psychomotor Activity  Psychomotor  Activity: No data recorded  Physical Exam: Physical Exam Vitals and nursing note reviewed.  HENT:     Head: Normocephalic.     Nose: Nose normal.     Mouth/Throat:     Pharynx: Oropharynx is clear.  Eyes:     Pupils: Pupils are equal, round, and reactive to light.  Cardiovascular:     Rate and Rhythm: Normal rate.     Pulses: Normal pulses.  Pulmonary:     Effort: Pulmonary effort is normal.  Genitourinary:    Comments: Deferred Musculoskeletal:        General: Normal range of motion.     Cervical back: Normal range of motion.  Skin:    General: Skin is warm and dry.  Neurological:     General: No focal deficit present.     Mental Status: She is alert and oriented to person, place, and time.    Review of Systems  Constitutional:  Negative for chills, diaphoresis and fever.  HENT:  Negative for congestion and sore throat.   Eyes:  Negative for blurred vision.  Respiratory:  Negative for cough, shortness of breath and wheezing.   Cardiovascular:  Negative for chest pain and palpitations.  Gastrointestinal:  Negative for abdominal pain, constipation, diarrhea, heartburn, nausea and vomiting.  Genitourinary:  Negative for dysuria.  Musculoskeletal:  Negative for joint pain and myalgias.  Skin:  Negative for itching and rash.  Neurological:  Negative for dizziness, tingling, tremors, sensory change, speech change, focal weakness, seizures, loss of consciousness, weakness and headaches.  Endo/Heme/Allergies:        NKDA  Psychiatric/Behavioral:  Positive for depression. Negative for hallucinations, memory loss, substance abuse and suicidal ideas. The patient is not nervous/anxious and does not have insomnia.    Blood pressure 109/71, pulse 91, temperature 98 F (36.7 C), resp. rate 16, height 4\' 11"  (1.499 m), weight 63 kg, SpO2 100%. Body mass index is 28.05 kg/m.  Treatment Plan Summary: Patient is at her baseline.  She is tolerating her medications well.  There is no  dangerousness.  She is stable for care as soon as we finalize safe and appropriate disposition.  We will continue her medications and evaluate her further.  1.  Continue Invega ER 6 mg at bedtime for mood control. 2.  Continue sertraline 150 mg daily for depression.. 3.  Continue trazodone 50 mg as needed for insomnia. 4.  Continue to monitor mood behavior and interaction with others. 5.  Continue to encourage unit groups and therapeutic activities.  Armandina Stammer, NP, pmhnp, fnp-bc. 07/04/2023, 10:45 AM Patient ID: Larey Brick, female   DOB: 06-Apr-1976, 47 y.o.   MRN: [id number]

## 2023-07-04 NOTE — [facility] Group Notes (Signed)
Adult Psychoeducational Group Note  Date:  07/04/2023 Time:  9:19 PM  Group Topic/Focus:  Wrap-Up Group:   The focus of this group is to help patients review their daily goal of treatment and discuss progress on daily workbooks.  Participation Level:  Did Not Attend  Christ Kick 07/04/2023, 9:19 PM

## 2023-07-04 NOTE — Progress Notes (Signed)
   07/03/23 2046  Psych Admission Type (Psych Patients Only)  Admission Status Involuntary  Psychosocial Assessment  Patient Complaints Depression  Eye Contact Fair  Facial Expression Sad;Flat  Affect Depressed  Speech Logical/coherent  Interaction Assertive;Childlike  Motor Activity Slow  Appearance/Hygiene Unremarkable  Behavior Characteristics Cooperative;Appropriate to situation  Mood Pleasant;Sad  Thought Process  Coherency WDL  Content WDL  Delusions None reported or observed  Perception WDL  Hallucination None reported or observed  Judgment Limited  Confusion None  Danger to Self  Current suicidal ideation? Denies  Agreement Not to Harm Self Yes  Description of Agreement verbal  Danger to Others  Danger to Others None reported or observed

## 2023-07-04 NOTE — Discharge Planning (Signed)
LCSW attempted to get in contact with Dr. Cheree Ditto at the Group Home on today, however received no answer. LCSW contacted [facility] and was able to speak to her regarding updates. Per Mrs. Lucinda, she is currently waiting on [facility] to provide an update regarding the status of her submission for admission, however reports she has not received any update. LCSW expressed understanding and explored if she had a representative she was speaking to directly from [facility] and she stated "Baldo Daub". No update has been provided at this time. LCSW will follow up to see if an update can be provided by [facility] department, however no information was provided to this provider last time as [facility] was not accepted at agency. There has been multiple emails including [facility], however no update received. LCSW will provide updates as received.   LCSW received phone call back from Stanford Scotland at [facility] regarding making a referral for [facility] placement. Application was sent to this provider's email and patient will need updated CCA within last two years, psychological evaluation within the past 5 years, and an FLA attached. LCSW will review information.   Fernande Boyden, LCSW Clinical Social Worker [facility] Ph: [phone]

## 2023-07-05 DIAGNOSIS — F251 Schizoaffective disorder, depressive type: Secondary | ICD-10-CM | POA: Diagnosis not present

## 2023-07-05 NOTE — Group Note (Signed)
Date:  07/05/2023 Time:  9:13 PM  Group Topic/Focus:  Wrap-Up Group:   The focus of this group is to help patients review their daily goal of treatment and discuss progress on daily workbooks.    Participation Level:  Did Not Attend  Participation Quality:   N/A  Affect:   N/A  Cognitive:   N/A  Insight: None  Engagement in Group:   N/A  Modes of Intervention:   N/A  Additional Comments:  Patient did not attend wrap up group.   Kennieth Francois 07/05/2023, 9:13 PM

## 2023-07-05 NOTE — Plan of Care (Signed)
  Problem: Coping: Goal: Coping ability will improve Outcome: Progressing   Problem: Medication: Goal: Compliance with prescribed medication regimen will improve Outcome: Progressing   Problem: Self-Concept: Goal: Ability to disclose and discuss suicidal ideas will improve Outcome: Progressing   Problem: Education: Goal: Emotional status will improve Outcome: Progressing Goal: Verbalization of understanding the information provided will improve Outcome: Progressing   Problem: Activity: Goal: Interest or engagement in activities will improve Outcome: Progressing

## 2023-07-05 NOTE — Progress Notes (Signed)
[facility] MD Progress Note  07/05/2023 9:23 AM Yolanda [name]  MRN:  [id number]  Reason for admission: 47 year old African-American female, single, on SSI disability, homeless.  Background history of intellectual disability, substance use disorder and schizoaffective disorder depressive type.  Presented via emergency services on account of worsening depression associated with suicidal thoughts.  Expressed thoughts of suicide by stabbing herself.  Chart reviewed today.  Patient discussed at multidisciplinary team meeting.  Daily notes: Yolanda [name] is seen in Yolanda [name] room. Chart reviewed. Yolanda chart findings discussed with Yolanda treatment team. Yolanda [name] was coming out of Yolanda [name] room to Yolanda dayroom at Yolanda time. Yolanda [name] presents alert, but with a flat affect. However, Yolanda [name] is able to make needs known. Yolanda [name] is making a good eye contact. Yolanda [name] reports, "I'm okay today. I still have Yolanda heard any news about Yolanda group home". While expressing herself, Yolanda [name] presents with a teary eyes. Yolanda [name] is informed that Yolanda staff here have no new information about Yolanda group home to share with Yolanda [name]. Yolanda [name] is informed that once we learn anything new, Yolanda [name] will be Yolanda first to know. Yolanda [name] is taking & tolerating Yolanda [name] treatment regimen. Denies any side effects. Kennice do attend group sessions, usually towards Yolanda afternoon after lunch. Yolanda [name] currently denies any SIHI, AVH, delusional thoughts or paranoia. Yolanda [name] does Yolanda appear to be responding to any internal stimuli. Yolanda [name] working on patient's case to place Yolanda [name] in a group home setting states that we are still waiting to hear from one group home who has shown some interest in accepting Yolanda [name] into their group home setting. At this time, Yolanda [name] is being provided with support & encouragement while Yolanda [name] awaits to hear from Yolanda group home. There are no behavioral issues documented by staff. Yolanda [name] vital signs remain stable. There are no new lab results. There are no changes made on Yolanda current plan of care. Continue as  already in progress.  Principal Problem: Schizoaffective disorder, depressive type (HCC)  Diagnosis: Principal Problem:   Schizoaffective disorder, depressive type (HCC) Active Problems:   GERD (gastroesophageal reflux disease)   Intellectual disability   Tobacco use disorder  Total Time spent with patient: 20 minutes  Past Psychiatric History: Extensive history of mental illness.  Yolanda [name] has been tried on multiple medications over Yolanda years.  See H&P for full details.  Past Medical History:  Past Medical History:  Diagnosis Date   Bipolar affect, depressed (HCC)    Constipation 08/17/2022   Depression    Falls 07/21/2022   Fracture of femoral neck, right, closed (HCC) 01/17/2022   Herpes simplex 08/22/2017   Open fracture dislocation of right elbow joint 01/17/2022    Past Surgical History:  Procedure Laterality Date   NO PAST SURGERIES     SALPINGECTOMY     Family History: History reviewed. No pertinent family history.  Family Psychiatric  History: Extensive family history of mood disorder.  Social History:  Social History   Substance and Sexual Activity  Alcohol Use Yes     Social History   Substance and Sexual Activity  Drug Use Yes   Types: Cocaine, Marijuana    Social History   Socioeconomic History   Marital status: Single    Spouse name: Yolanda on file   Number of children: Yolanda on file   Years of education: Yolanda on file   Highest education level: Yolanda on file  Occupational History   Yolanda on file  Tobacco Use   Smoking status: Every Day   Smokeless tobacco:  Yolanda on file  Substance and Sexual Activity   Alcohol use: Yes   Drug use: Yes    Types: Cocaine, Marijuana   Sexual activity: Yes  Other Topics Concern   Yolanda on file  Social History Narrative   Yolanda on file   Social Drivers of Health   Financial Resource Strain: Low Risk  (09/12/2022)   Received from [facility]   Overall Financial Resource Strain (CARDIA)    Difficulty of  Paying Living Expenses: Yolanda hard at all  Food Insecurity: Patient Declined (12/20/2022)   Hunger Vital Sign    Worried About Running Out of Food in Yolanda Last Year: Patient declined    Ran Out of Food in Yolanda Last Year: Patient declined  Transportation Needs: No Transportation Needs (12/20/2022)   PRAPARE - Administrator, Civil Service (Medical): No    Lack of Transportation (Non-Medical): No  Physical Activity: Yolanda on file  Stress: No Stress Concern Present (07/17/2022)   Received from [facility], [facility] Occup[facility] - Occupational Stress Questionnaire    Feeling of Stress : Yolanda at all  Social Connections: Unknown (07/16/2022)   Received from [facility], [facility]   Social Network    Social Network: Yolanda on file   Current Medications: Current Facility-Administered Medications  Medication Dose Route Frequency Provider Last Rate Last Admin   acetaminophen (TYLENOL) tablet 650 mg  650 mg Oral Q6H PRN [name], NP   650 mg at 06/28/23 2100   alum & mag hydroxide-simeth (MAALOX/MYLANTA) 200-200-20 MG/5ML suspension 30 mL  30 mL Oral Q4H PRN Onuoha, Chinwendu V, NP   30 mL at 05/31/23 0801   busPIRone (BUSPAR) tablet 15 mg  15 mg Oral BID [name], NP   15 mg at 07/05/23 0734   cyanocobalamin (VITAMIN B12) injection 1,000 mcg  1,000 mcg Intramuscular Q30 days Sarita Bottom, MD   1,000 mcg at 07/03/23 1009   diphenhydrAMINE (BENADRYL) capsule 50 mg  50 mg Oral TID PRN [name], NP   50 mg at 06/03/23 1110   Or   diphenhydrAMINE (BENADRYL) injection 50 mg  50 mg Intramuscular TID PRN [name], NP       docusate sodium (COLACE) capsule 100 mg  100 mg Oral Daily Nkwenti, Doris, NP   100 mg at 07/05/23 0734   haloperidol (HALDOL) tablet 5 mg  5 mg Oral TID PRN [name], NP   5 mg at 06/03/23 1110   Or   haloperidol lactate (HALDOL) injection 5 mg  5 mg Intramuscular TID PRN [name], NP       hydrocortisone cream  1 %   Topical BID Cecilie Lowers, FNP   Given at 07/02/23 1733   hydrOXYzine (ATARAX) tablet 25 mg  25 mg Oral TID PRN [name], DO   25 mg at 07/01/23 1928   LORazepam (ATIVAN) tablet 2 mg  2 mg Oral TID PRN [name], NP   2 mg at 06/03/23 1110   Or   LORazepam (ATIVAN) injection 2 mg  2 mg Intramuscular TID PRN [name], NP       magnesium hydroxide (MILK OF MAGNESIA) suspension 30 mL  30 mL Oral Daily PRN [name], NP   30 mL at 06/23/23 1856   melatonin tablet 5 mg  5 mg Oral QHS [name], NP   5 mg at 07/04/23 2103   nicotine polacrilex (NICORETTE)  gum 2 mg  2 mg Oral PRN [name], MD   2 mg at 07/04/23 1814   paliperidone (INVEGA) 24 hr tablet 6 mg  6 mg Oral Daily Nkwenti, Doris, NP   6 mg at 07/04/23 2103   pantoprazole (PROTONIX) EC tablet 40 mg  40 mg Oral Daily Bradd Merlos, Nicole Kindred I, NP   40 mg at 07/05/23 0734   polyethylene glycol (MIRALAX / GLYCOLAX) packet 17 g  17 g Oral Daily PRN [name], MD   17 g at 06/07/23 1847   polyethylene glycol (MIRALAX / GLYCOLAX) packet 17 g  17 g Oral Daily [name], MD   17 g at 06/30/23 0815   sertraline (ZOLOFT) tablet 150 mg  150 mg Oral Daily Massengill, Harrold Donath, MD   150 mg at 07/05/23 0734   SUMAtriptan (IMITREX) tablet 25 mg  25 mg Oral BID PRN [name], NP   25 mg at 05/07/23 2112   traZODone (DESYREL) tablet 50 mg  50 mg Oral QHS [name], NP   50 mg at 07/04/23 2104   Vitamin D (Ergocalciferol) (DRISDOL) 1.25 MG (50000 UNIT) capsule 50,000 Units  50,000 Units Oral Q7 days [name], NP   50,000 Units at 06/29/23 2058   Lab Results: No results found for this or any previous visit (from Yolanda past 48 hours).  Blood Alcohol level:  Lab Results  Component Value Date   ETH <10 12/19/2022   ETH <10 11/21/2019   Metabolic Disorder Labs: Lab Results  Component Value Date   HGBA1C 5.6 06/16/2023   MPG 114.02 06/16/2023   MPG 79.58 12/21/2022   No results found for: "PROLACTIN" Lab  Results  Component Value Date   CHOL 256 (H) 06/16/2023   TRIG 224 (H) 06/16/2023   HDL 83 06/16/2023   CHOLHDL 3.1 06/16/2023   VLDL 45 (H) 06/16/2023   LDLCALC 128 (H) 06/16/2023   LDLCALC 149 (H) 12/21/2022   Physical Findings: AIMS: Facial and Oral Movements Muscles of Facial Expression: None Lips and Perioral Area: None Jaw: None Tongue: None,Extremity Movements Upper (arms, wrists, hands, fingers): None Lower (legs, knees, ankles, toes): None, Trunk Movements Neck, shoulders, hips: None, Global Judgements Severity of abnormal movements overall : None Incapacitation due to abnormal movements: None Patient's awareness of abnormal movements: No Awareness, Dental Status Current problems with teeth and/or dentures?: No Does patient usually wear dentures?: No  CIWA:    COWS:     Musculoskeletal: Strength & Muscle Tone: within normal limits Gait & Station: normal Patient leans: N/A  Psychiatric Specialty Exam:  Presentation  General Appearance:  Yolanda [name], Yolanda [name], Yolanda [name].  No EPS. Eye Contact: Good  Speech: Slurred and slow.  Speech Volume: At baseline.  Mood and Affect  Mood: Depressed but Yolanda pervasively so. Affect: Blunted and mood congruent.  Thought Process  Thought Processes: Decreased speed of thoughts but goal-directed.  Descriptions of Associations:  Linear.  Orientation:Full (Time, Place and Person)  Thought Content: Negative ruminations about what a place to call home. Negative rumination about being in Yolanda hospital during Yolanda holidays.  No active suicidal thoughts.  No homicidal thoughts.  No thoughts of violence.  No delusional preoccupation.  Hallucinations: No hallucination in any modality.  Sensorium  Memory: Immediate Fair; Recent Good; Remote Good  Judgment: Fair  Insight: Shallow  Executive Functions  Concentration: Fair  Attention Span: Fair  Recall: Fair  Fund of  Knowledge: Fair  Language: Fair  Psychomotor Activity  Psychomotor Activity:  No data recorded  Physical Exam: Physical Exam Vitals and nursing note reviewed.  HENT:     Head: Normocephalic.     Nose: Nose normal.     Mouth/Throat:     Pharynx: Oropharynx is clear.  Eyes:     Pupils: Pupils are equal, round, and reactive to light.  Cardiovascular:     Rate and Rhythm: Normal rate.     Pulses: Normal pulses.  Pulmonary:     Effort: Pulmonary effort is normal.  Genitourinary:    Comments: Deferred Musculoskeletal:        General: Normal range of motion.     Cervical back: Normal range of motion.  Skin:    General: Skin is warm and dry.  Neurological:     General: No focal deficit present.     Mental Status: Yolanda [name] is alert and oriented to person, place, and time.    Review of Systems  Constitutional:  Negative for chills, diaphoresis and fever.  HENT:  Negative for congestion and sore throat.   Eyes:  Negative for blurred vision.  Respiratory:  Negative for cough, shortness of breath and wheezing.   Cardiovascular:  Negative for chest pain and palpitations.  Gastrointestinal:  Negative for abdominal pain, constipation, diarrhea, heartburn, nausea and vomiting.  Genitourinary:  Negative for dysuria.  Musculoskeletal:  Negative for joint pain and myalgias.  Skin:  Negative for itching and rash.  Neurological:  Negative for dizziness, tingling, tremors, sensory change, speech change, focal weakness, seizures, loss of consciousness, weakness and headaches.  Endo/Heme/Allergies:        NKDA  Psychiatric/Behavioral:  Positive for depression. Negative for hallucinations, memory loss, substance abuse and suicidal ideas. Yolanda patient is Yolanda nervous/anxious and does Yolanda have insomnia.    Blood pressure 98/67, pulse 70, temperature 97.9 F (36.6 C), temperature source Oral, resp. rate 18, height 4\' 11"  (1.499 m), weight 63 kg, SpO2 99%. Body mass index is 28.05 kg/m.  Treatment  Plan Summary: Patient is at Yolanda [name] baseline.  Yolanda [name] is tolerating Yolanda [name] medications well.  There is no dangerousness.  Yolanda [name] is stable for care as soon as we finalize safe and appropriate disposition.  We will continue Yolanda [name] medications and evaluate Yolanda [name] further.  1.  Continue Invega ER 6 mg at bedtime for mood control. 2.  Continue sertraline 150 mg daily for depression.. 3.  Continue trazodone 50 mg as needed for insomnia. 4.  Continue to monitor mood behavior and interaction with others. 5.  Continue to encourage unit groups and therapeutic activities.  Armandina Stammer, NP, pmhnp, fnp-bc. 07/05/2023, 9:23 AM Patient ID: Larey Brick, female   DOB: [birth year], 47 y.o.   MRN: [id number] Patient ID: ZEMA FORTI, female   DOB: [birth year], 47 y.o.   MRN: [id number]

## 2023-07-05 NOTE — Group Note (Signed)
Recreation Therapy Group Note   Group Topic:Communication  Group Date: 07/05/2023 Start Time: 0935 End Time: 1054 Facilitators: Romonia Yanik-McCall, LRT,CTRS Location: [address] Dayroom   Group Topic: Communication, Team Building, Problem Solving  Goal Area(s) Addresses:  Patient will effectively work with peer towards shared goal.  Patient will identify skill used to make activity successful.  Patient will identify how skills used during activity can be used to reach post d/c goals.   Intervention: Cup [employer] bands with attached strings enough for each group member, 10 or more cups  Group Description: Patient(s) were given a set of solo cups, a rubber band, and some tied strings. The objective is to build a pyramid with the cups by only using the rubber band and string to move the cups. After the activity the patient(s) are LRT debriefed and discussed what strategies worked, what didn't, and what lessons they can take from the activity and use in life post discharge.   Education Areas: Pharmacist, community, Counsellor, Discharge Planning   Education Outcome: Acknowledges education   Affect/Mood: N/A   Participation Level: Did not attend    Clinical Observations/Individualized Feedback:     Plan: Continue to engage patient in RT group sessions 2-3x/week.   Yolanda [name], LRT,CTRS 07/05/2023 11:14 AM

## 2023-07-05 NOTE — Progress Notes (Signed)
   07/04/23 2021  Psych Admission Type (Psych Patients Only)  Admission Status Involuntary  Psychosocial Assessment  Patient Complaints Anxiety;Depression  Eye Contact Fair  Facial Expression Animated  Affect Depressed  Speech Logical/coherent  Interaction Assertive;Childlike  Motor Activity Slow  Appearance/Hygiene Disheveled  Behavior Characteristics Cooperative;Calm  Mood Pleasant;Sad  Thought Process  Coherency WDL  Content WDL  Delusions None reported or observed  Perception WDL  Hallucination None reported or observed  Judgment Limited  Confusion None  Danger to Self  Current suicidal ideation? Denies  Agreement Not to Harm Self Yes  Description of Agreement verbal  Danger to Others  Danger to Others None reported or observed

## 2023-07-05 NOTE — Plan of Care (Signed)
  Problem: Education: Goal: Ability to make informed decisions regarding treatment will improve Outcome: Adequate for Discharge   Problem: Coping: Goal: Coping ability will improve Outcome: Adequate for Discharge   Problem: Health Behavior/Discharge Planning: Goal: Identification of resources available to assist in meeting health care needs will improve Outcome: Adequate for Discharge   Problem: Self-Concept: Goal: Ability to disclose and discuss suicidal ideas will improve Outcome: Adequate for Discharge Goal: Will verbalize positive feelings about self Outcome: Adequate for Discharge Note: Patient is . Patient will maintain adherence

## 2023-07-05 NOTE — [facility] Group Notes (Signed)
Adult Psychoeducational Group Note  Date:  07/05/2023 Time:  9:27 AM  Group Topic/Focus:  Emotional Education:   The focus of this group is to discuss what feelings/emotions are, and how they are experienced. Goals Group:   The focus of this group is to help patients establish daily goals to achieve during treatment and discuss how the patient can incorporate goal setting into their daily lives to aide in recovery.  Participation Level:  Did Not Attend    Additional Comments: Did not attend  Hermena Swint Alen Blew

## 2023-07-05 NOTE — Progress Notes (Signed)
   07/05/23 0900  Psych Admission Type (Psych Patients Only)  Admission Status Involuntary  Psychosocial Assessment  Patient Complaints None  Eye Contact Fair  Facial Expression Flat;Sad  Affect Depressed  Speech Logical/coherent  Interaction Assertive;Childlike  Motor Activity Slow  Appearance/Hygiene Disheveled  Behavior Characteristics Cooperative;Calm  Mood Silly;Depressed  Thought Process  Coherency WDL  Content WDL  Delusions None reported or observed  Perception WDL  Hallucination None reported or observed  Judgment Limited  Confusion None  Danger to Self  Current suicidal ideation? Denies  Agreement Not to Harm Self Yes  Description of Agreement verbal  Danger to Others  Danger to Others None reported or observed

## 2023-07-06 DIAGNOSIS — F251 Schizoaffective disorder, depressive type: Secondary | ICD-10-CM

## 2023-07-06 NOTE — Progress Notes (Signed)
   07/06/23 2019  Psych Admission Type (Psych Patients Only)  Admission Status Involuntary  Psychosocial Assessment  Patient Complaints Depression  Eye Contact Fair  Facial Expression Anxious  Affect Appropriate to circumstance  Speech Logical/coherent  Interaction Childlike  Motor Activity Slow  Appearance/Hygiene Disheveled  Behavior Characteristics Appropriate to situation  Mood Depressed;Pleasant  Thought Process  Coherency WDL  Content WDL  Delusions None reported or observed  Perception WDL  Hallucination None reported or observed  Judgment Limited  Confusion None  Danger to Self  Current suicidal ideation? Denies  Self-Injurious Behavior No self-injurious ideation or behavior indicators observed or expressed   Agreement Not to Harm Self Yes  Description of Agreement verbal  Danger to Others  Danger to Others None reported or observed

## 2023-07-06 NOTE — Plan of Care (Signed)
Nurse discussed anxiety, depression and coping skills with patient.  

## 2023-07-06 NOTE — Progress Notes (Signed)
Assumed care of patient this pm,she presented A&O x2 , affect & mood, remain sad, depressed and needy, "can I talk to you, I did something wrong and I can't forgive myself". Pt declined to disclose what she feels she did, but, did report she feels safe and without plan or intent to harm self or others and made a safety commitment, is compliant with taking her medications and following her tx goals. Pt educated on plan of care, medication regimen and she acknowledged an understanding and was without further complaints or concerns.Pt is been maintained on q15 min rounds for safety and support.

## 2023-07-06 NOTE — Progress Notes (Signed)
[facility] MD Progress Note  07/06/2023 11:35 AM Yolanda [name]  MRN:  [id number]  Reason for admission:  History of Present Illness: This is the first psychiatric admission in this [facility] in 49 years for this AA female with an extensive hx of mental illnesses & probable polysubstance use disorders. She is admitted to the [facility] from the [facility] hospital with complain of worsening suicidal ideations with plan to stab herself. Per chart review, patient apparently reported at the ED that she has been depressed for a while & has not been taking her mental health medications. After medical evaluation.clearance, she was transferred to the [facility] for further psychiatric evaluation/treatments.   Daily notes: Yolanda [name] is seen in her room this morning. She is lying down in bed, making a fair eye contact today. She is visible on the unit, usually towards lunch time. She presents today with a flat affect, tearful as she was yesterday. She is hoping for some news about her group home placement. However, the news she is hoping for is not available as she has hoped. She reports, "I'm okay. Have you heard any news about the group home?" The staff nurse working with Yolanda [name] today states during treatment team that [facility] had approached her to talk to this staff nurse. The staff nurse states that when she went into patient's room to hear what she's got to say, she declined to talk. At this time, patient remains on her current plan of care. There are no behavioral issues reported or documented by staff. She currently denies any SIHI, AVH, delusional thoughts or paranoia. She does not appear to be responding to any internal stimuli. The staff continues to provide patient with support & encouraged. Her vital signs remain stable.  Principal Problem: Schizoaffective disorder, depressive type (HCC)  Diagnosis: Principal Problem:   Schizoaffective disorder, depressive type (HCC) Active Problems:   GERD (gastroesophageal reflux  disease)   Intellectual disability   Tobacco use disorder  Total Time spent with patient:  35 minutes  Past Psychiatric History: See H&P.  Past Medical History:  Past Medical History:  Diagnosis Date   Bipolar affect, depressed (HCC)    Constipation 08/17/2022   Depression    Falls 07/21/2022   Fracture of femoral neck, right, closed (HCC) 01/17/2022   Herpes simplex 08/22/2017   Open fracture dislocation of right elbow joint 01/17/2022    Past Surgical History:  Procedure Laterality Date   NO PAST SURGERIES     SALPINGECTOMY     Family History: History reviewed. No pertinent family history.  Family Psychiatric  History: See H&P.  Social History:  Social History   Substance and Sexual Activity  Alcohol Use Yes     Social History   Substance and Sexual Activity  Drug Use Yes   Types: Cocaine, Marijuana    Social History   Socioeconomic History   Marital status: Single    Spouse name: Not on file   Number of children: Not on file   Years of education: Not on file   Highest education level: Not on file  Occupational History   Not on file  Tobacco Use   Smoking status: Every Day   Smokeless tobacco: Not on file  Substance and Sexual Activity   Alcohol use: Yes   Drug use: Yes    Types: Cocaine, Marijuana   Sexual activity: Yes  Other Topics Concern   Not on file  Social History Narrative   Not on file   Social Drivers of  Health   Financial Resource Strain: Low Risk  (09/12/2022)   Received from [facility]   Overall Financial Resource Strain (CARDIA)    Difficulty of Paying Living Expenses: Not hard at all  Food Insecurity: Patient Declined (12/20/2022)   Hunger Vital Sign    Worried About Running Out of Food in the Last Year: Patient declined    Ran Out of Food in the Last Year: Patient declined  Transportation Needs: No Transportation Needs (12/20/2022)   PRAPARE - Administrator, Civil Service (Medical): No    Lack of  Transportation (Non-Medical): No  Physical Activity: Not on file  Stress: No Stress Concern Present (07/17/2022)   Received from [facility] - Occupational Stress Questionnaire    Feeling of Stress : Not at all  Social Connections: Unknown (07/16/2022)   Received from [facility], [facility]   Social Network    Social Network: Not on file   Additional Social History:   Sleep: Good  Appetite:  Good  Current Medications: Current Facility-Administered Medications  Medication Dose Route Frequency Provider Last Rate Last Admin   acetaminophen (TYLENOL) tablet 650 mg  650 mg Oral Q6H PRN [name], NP   650 mg at 07/05/23 2054   alum & mag hydroxide-simeth (MAALOX/MYLANTA) 200-200-20 MG/5ML suspension 30 mL  30 mL Oral Q4H PRN Onuoha, Chinwendu V, NP   30 mL at 05/31/23 0801   busPIRone (BUSPAR) tablet 15 mg  15 mg Oral BID [name], NP   15 mg at 07/06/23 0815   cyanocobalamin (VITAMIN B12) injection 1,000 mcg  1,000 mcg Intramuscular Q30 days Sarita Bottom, MD   1,000 mcg at 07/03/23 1009   diphenhydrAMINE (BENADRYL) capsule 50 mg  50 mg Oral TID PRN [name], NP   50 mg at 06/03/23 1110   Or   diphenhydrAMINE (BENADRYL) injection 50 mg  50 mg Intramuscular TID PRN [name], NP       docusate sodium (COLACE) capsule 100 mg  100 mg Oral Daily Nkwenti, Tyler Aas, NP   100 mg at 07/06/23 0815   haloperidol (HALDOL) tablet 5 mg  5 mg Oral TID PRN [name], NP   5 mg at 06/03/23 1110   Or   haloperidol lactate (HALDOL) injection 5 mg  5 mg Intramuscular TID PRN [name], NP       hydrocortisone cream 1 %   Topical BID Cecilie Lowers, FNP   Given at 07/02/23 1733   hydrOXYzine (ATARAX) tablet 25 mg  25 mg Oral TID PRN [name], DO   25 mg at 07/01/23 1928   LORazepam (ATIVAN) tablet 2 mg  2 mg Oral TID PRN [name], NP   2 mg at 06/03/23 1110   Or   LORazepam (ATIVAN) injection 2 mg  2 mg  Intramuscular TID PRN [name], NP       magnesium hydroxide (MILK OF MAGNESIA) suspension 30 mL  30 mL Oral Daily PRN [name], NP   30 mL at 06/23/23 1856   melatonin tablet 5 mg  5 mg Oral QHS Nkwenti, Doris, NP   5 mg at 07/05/23 2054   nicotine polacrilex (NICORETTE) gum 2 mg  2 mg Oral PRN [name], MD   2 mg at 07/05/23 1722   paliperidone (INVEGA) 24 hr tablet 6 mg  6 mg Oral Daily [name], NP   6 mg at  07/05/23 2054   pantoprazole (PROTONIX) EC tablet 40 mg  40 mg Oral Daily Marcelis Wissner, Nicole Kindred I, NP   40 mg at 07/06/23 0816   polyethylene glycol (MIRALAX / GLYCOLAX) packet 17 g  17 g Oral Daily PRN [name], MD   17 g at 06/07/23 1847   polyethylene glycol (MIRALAX / GLYCOLAX) packet 17 g  17 g Oral Daily [name], MD   17 g at 06/30/23 0815   sertraline (ZOLOFT) tablet 150 mg  150 mg Oral Daily Massengill, Harrold Donath, MD   150 mg at 07/06/23 0816   SUMAtriptan (IMITREX) tablet 25 mg  25 mg Oral BID PRN [name], NP   25 mg at 07/05/23 1908   traZODone (DESYREL) tablet 50 mg  50 mg Oral QHS [name], NP   50 mg at 07/05/23 2054   Vitamin D (Ergocalciferol) (DRISDOL) 1.25 MG (50000 UNIT) capsule 50,000 Units  50,000 Units Oral Q7 days [name], NP   50,000 Units at 06/29/23 2058   Lab Results: No results found for this or any previous visit (from the past 48 hours).  Blood Alcohol level:  Lab Results  Component Value Date   ETH <10 12/19/2022   ETH <10 11/21/2019   Metabolic Disorder Labs: Lab Results  Component Value Date   HGBA1C 5.6 06/16/2023   MPG 114.02 06/16/2023   MPG 79.58 12/21/2022   No results found for: "PROLACTIN" Lab Results  Component Value Date   CHOL 256 (H) 06/16/2023   TRIG 224 (H) 06/16/2023   HDL 83 06/16/2023   CHOLHDL 3.1 06/16/2023   VLDL 45 (H) 06/16/2023   LDLCALC 128 (H) 06/16/2023   LDLCALC 149 (H) 12/21/2022   Physical Findings: AIMS: Facial and Oral Movements Muscles of Facial Expression:  None Lips and Perioral Area: None Jaw: None Tongue: None,Extremity Movements Upper (arms, wrists, hands, fingers): None Lower (legs, knees, ankles, toes): None, Trunk Movements Neck, shoulders, hips: None, Global Judgements Severity of abnormal movements overall : None Incapacitation due to abnormal movements: None Patient's awareness of abnormal movements: No Awareness, Dental Status Current problems with teeth and/or dentures?: No Does patient usually wear dentures?: No  CIWA:    COWS:     Musculoskeletal: Strength & Muscle Tone: within normal limits Gait & Station: normal Patient leans:  Walks with a slight  limp like one leg is longer than the other.  Psychiatric Specialty Exam:  Presentation  General Appearance:  Casual; Fairly Groomed  Eye Contact: Fair  Speech: Clear and Coherent; Normal Rate  Speech Volume: Normal  Handedness: Right   Mood and Affect  Mood: Depressed  Affect: Congruent   Thought Process  Thought Processes: Coherent; Linear  Descriptions of Associations:Intact  Orientation:Full (Time, Place and Person)  Thought Content:Logical  History of Schizophrenia/Schizoaffective disorder:Yes  Duration of Psychotic Symptoms:Greater than six months  Hallucinations:Hallucinations: None Description of Command Hallucinations: NA Description of Auditory Hallucinations: NA  Ideas of Reference:None  Suicidal Thoughts:Suicidal Thoughts: No  Homicidal Thoughts:Homicidal Thoughts: No   Sensorium  Memory: Immediate Good; Recent Good; Remote Good  Judgment: Fair  Insight: Good   Executive Functions  Concentration: Good  Attention Span: Good  Recall: Good  Fund of Knowledge: Fair  Language: Good  Psychomotor Activity  Psychomotor Activity: Psychomotor Activity: Normal  Assets  Assets: Communication Skills; Desire for Improvement; Financial Resources/Insurance; Resilience; Social Support  Sleep  Sleep: Sleep:  Good Number of Hours of Sleep: 8  Physical Exam: Physical Exam Vitals and nursing note reviewed.  HENT:     Mouth/Throat:     Pharynx: Oropharynx is clear.  Cardiovascular:     Rate and Rhythm: Normal rate.     Pulses: Normal pulses.  Pulmonary:     Effort: Pulmonary effort is normal.  Genitourinary:    Comments: Deferred Musculoskeletal:        General: Normal range of motion.     Cervical back: Normal range of motion.  Skin:    General: Skin is warm and dry.  Neurological:     General: No focal deficit present.     Mental Status: She is alert and oriented to person, place, and time.    Review of Systems  Constitutional:  Negative for chills, diaphoresis and fever.  HENT:  Negative for congestion and sore throat.   Respiratory:  Negative for cough, shortness of breath and wheezing.   Cardiovascular:  Negative for chest pain and palpitations.  Gastrointestinal:  Negative for abdominal pain, constipation, diarrhea, heartburn, nausea and vomiting.  Musculoskeletal:  Negative for joint pain and myalgias.  Skin:  Negative for itching and rash.  Neurological:  Negative for dizziness, tingling, tremors, sensory change, speech change, focal weakness, seizures, loss of consciousness, weakness and headaches.  Endo/Heme/Allergies:        NKDA  Psychiatric/Behavioral:  Positive for depression. Negative for hallucinations, memory loss, substance abuse and suicidal ideas. The patient is not nervous/anxious and does not have insomnia.    Blood pressure 99/68, pulse 87, temperature 97.9 F (36.6 C), temperature source Oral, resp. rate 18, height 4\' 11"  (1.499 m), weight 63 kg, SpO2 99%. Body mass index is 28.05 kg/m.  Treatment Plan Summary: Daily contact with patient to assess and evaluate symptoms and progress in treatment and Medication management.  Continue inpatient hospitalization.  Will continue today 07/06/2023 plan as below except where it is noted.   Patient maintained her  baseline. She is tolerating her medications well. There is no dangerousness. She is stable for care as soon as we finalize safe and appropriate disposition. We will continue her medications and evaluate her further.   Principal/active diagnoses:  Schizoaffective disorder, depressive type GERD (gastroesophageal reflux disease) Intellectual disability Tobacco use disorder  Plan: The risks/benefits/side-effects/alternatives to the medications in use were discussed in detail with the patient and time was given for patient's questions. The patient consents to medication trial.   1.  Continue Invega ER 6 mg at bedtime for mood control. 2.  Continue sertraline 150 mg daily for depression.. 3.  Continue trazodone 50 mg as needed for insomnia. 4.  Continue to monitor mood behavior and interaction with others. 5.  Continue to encourage unit groups and therapeutic activities.  Other PRNS -Continue Tylenol 650 mg every 6 hours PRN for mild pain -Continue Maalox 30 ml Q 4 hrs PRN for indigestion -Continue MOM 30 ml po Q 6 hrs for constipation  Safety and Monitoring: Voluntary admission to inpatient psychiatric unit for safety, stabilization and treatment Daily contact with patient to assess and evaluate symptoms and progress in treatment Patient's case to be discussed in multi-disciplinary team meeting Observation Level : q15 minute checks Vital signs: q12 hours Precautions: Safety  Discharge Planning: Social work and case management to assist with discharge planning and identification of hospital follow-up needs prior to discharge Estimated LOS: 5-7 days Discharge Concerns: Need to establish a safety plan; Medication compliance and effectiveness Discharge Goals: Return home with outpatient referrals for mental health follow-up including medication management/psychotherapy  Armandina Stammer, NP, pmhnp, fnp-bc.. 07/06/2023, 11:35  AM

## 2023-07-06 NOTE — Group Note (Signed)
Date:  07/06/2023 Time:  8:45 PM  Group Topic/Focus:  Wrap-Up Group:   The focus of this group is to help patients review their daily goal of treatment and discuss progress on daily workbooks.    Participation Level:  Did Not Attend  Participation Quality:   N/A\  Affect:   N/A  Cognitive:   N/A  Insight: None  Engagement in Group:   N/A  Modes of Intervention:   N/A  Additional Comments:  Patient did not attend group.   Yolanda [name] 07/06/2023, 8:45 PM

## 2023-07-06 NOTE — Progress Notes (Signed)
D:  Patient has been depressed most of the day.  Patient did deny SI and HI, contracts for safety later this morning.  Denied A/V hallucinations.  Denied pain.  Patient has been walking in hallway a few times today.  A:  Medications administered per MD oraders.  Emotional support and encouragement given patient. R:  Safety maintained with 15 minute checks.  Nurse has been in patient's room several times throughout the day to talk to patient per patient's request.  But patient does not have much to talk about.  Just wants someone with her.

## 2023-07-07 DIAGNOSIS — F251 Schizoaffective disorder, depressive type: Secondary | ICD-10-CM | POA: Diagnosis not present

## 2023-07-07 MED ORDER — ATORVASTATIN CALCIUM 10 MG PO TABS
10.0000 mg | ORAL_TABLET | Freq: Every day | ORAL | Status: DC
  Administered 2023-07-07 – 2023-08-08 (×33): 10 mg via ORAL
  Filled 2023-07-07 (×23): qty 1
  Filled 2023-07-07: qty 7
  Filled 2023-07-07 (×12): qty 1

## 2023-07-07 NOTE — Group Note (Signed)
Date:  07/07/2023 Time:  1:11 PM  Group Topic/Focus:  Conflict Resolution:   The focus of this group is to discuss the conflict resolution process and how it may be used upon discharge. Goals Group:   The focus of this group is to help patients establish daily goals to achieve during treatment and discuss how the patient can incorporate goal setting into their daily lives to aide in recovery. Orientation:   The focus of this group is to educate the patient on the purpose and policies of crisis stabilization and provide a format to answer questions about their admission.  The group details unit policies and expectations of patients while admitted.    Participation Level:  Did Not Attend   [name] 07/07/2023, 1:11 PM

## 2023-07-07 NOTE — Progress Notes (Cosign Needed)
[facility] MD Progress Note  07/07/2023 10:49 AM Yolanda [name]  MRN:  [id number]  Reason for admission:  History of Present Illness: This is the first psychiatric admission in this [facility] in 49 years for this AA female with an extensive hx of mental illnesses & probable polysubstance use disorders. She is admitted to the [facility] from the [facility] hospital with complain of worsening suicidal ideations with plan to stab herself. Per chart review, patient apparently reported at the ED that she has been depressed for a while & has not been taking her mental health medications. After medical evaluation.clearance, she was transferred to the [facility] for further psychiatric evaluation/treatments.   Daily notes: Yolanda [name] is seen in her room this morning. She is lying down in bed. She is making a fair eye contact & verbally responsive. She is visible on the unit, usually towards lunch time. She presents today with a flat/depressed  affect. She continues to hope & looking forward to moving into a group home when it becomes available. She reports today, "I'm doing okay". Tris. At this time, remains on her current plan of care. Her presence of depressed mood is related to the frustration she has been experiencing about her group home placement. Other than this, she appears to have been at her baseline over the last few months. There are no behavioral issues reported or documented by staff. Yolanda [name] currently denies any SIHI, AVH, delusional thoughts or paranoia. She does not appear to be responding to any internal stimuli. The staff continues to provide patient with support & encouragement. Her vital signs remain stable. She is started on Lipitor today due to elevated cholesterol/triglyceride.  Principal Problem: Schizoaffective disorder, depressive type (HCC)  Diagnosis: Principal Problem:   Schizoaffective disorder, depressive type (HCC) Active Problems:   GERD (gastroesophageal reflux disease)   Intellectual  disability   Tobacco use disorder  Total Time spent with patient:  35 minutes  Past Psychiatric History: See H&P.  Past Medical History:  Past Medical History:  Diagnosis Date   Bipolar affect, depressed (HCC)    Constipation 08/17/2022   Depression    Falls 07/21/2022   Fracture of femoral neck, right, closed (HCC) 01/17/2022   Herpes simplex 08/22/2017   Open fracture dislocation of right elbow joint 01/17/2022    Past Surgical History:  Procedure Laterality Date   NO PAST SURGERIES     SALPINGECTOMY     Family History: History reviewed. No pertinent family history.  Family Psychiatric  History: See H&P.  Social History:  Social History   Substance and Sexual Activity  Alcohol Use Yes     Social History   Substance and Sexual Activity  Drug Use Yes   Types: Cocaine, Marijuana    Social History   Socioeconomic History   Marital status: Single    Spouse name: Not on file   Number of children: Not on file   Years of education: Not on file   Highest education level: Not on file  Occupational History   Not on file  Tobacco Use   Smoking status: Every Day   Smokeless tobacco: Not on file  Substance and Sexual Activity   Alcohol use: Yes   Drug use: Yes    Types: Cocaine, Marijuana   Sexual activity: Yes  Other Topics Concern   Not on file  Social History Narrative   Not on file   Social Drivers of Health   Financial Resource Strain: Low Risk  (09/12/2022)   [facility]   Overall Financial Resource Strain (CARDIA)    Difficulty of Paying Living Expenses: Not hard at all  Food Insecurity: Patient Declined (12/20/2022)   Hunger Vital Sign    Worried About Running Out of Food in the Last Year: Patient declined    Ran Out of Food in the Last Year: Patient declined  Transportation Needs: No Transportation Needs (12/20/2022)   PRAPARE - Administrator, Civil Service (Medical): No    Lack of Transportation  (Non-Medical): No  Physical Activity: Not on file  Stress: No Stress Concern Present (07/17/2022)   Received from [facility], [facility] of Occupational [facility] - Occupational Stress Questionnaire    Feeling of Stress : Not at all  Social Connections: Unknown (07/16/2022)   Received from [facility], [facility]   Social Network    Social Network: Not on file   Additional Social History:   Sleep: Good  Appetite:  Good  Current Medications: Current Facility-Administered Medications  Medication Dose Route Frequency Provider Last Rate Last Admin   acetaminophen (TYLENOL) tablet 650 mg  650 mg Oral Q6H PRN [name], NP   650 mg at 07/05/23 2054   alum & mag hydroxide-simeth (MAALOX/MYLANTA) 200-200-20 MG/5ML suspension 30 mL  30 mL Oral Q4H PRN Onuoha, Chinwendu V, NP   30 mL at 05/31/23 0801   atorvastatin (LIPITOR) tablet 10 mg  10 mg Oral Daily Gohan Collister, Nicole Kindred I, NP       busPIRone (BUSPAR) tablet 15 mg  15 mg Oral BID [name], NP   15 mg at 07/07/23 0851   cyanocobalamin (VITAMIN B12) injection 1,000 mcg  1,000 mcg Intramuscular Q30 days [name], Nadir, MD   1,000 mcg at 07/03/23 1009   diphenhydrAMINE (BENADRYL) capsule 50 mg  50 mg Oral TID PRN [name], NP   50 mg at 06/03/23 1110   Or   diphenhydrAMINE (BENADRYL) injection 50 mg  50 mg Intramuscular TID PRN [name], NP       docusate sodium (COLACE) capsule 100 mg  100 mg Oral Daily Nkwenti, Doris, NP   100 mg at 07/07/23 0851   haloperidol (HALDOL) tablet 5 mg  5 mg Oral TID PRN [name], NP   5 mg at 06/03/23 1110   Or   haloperidol lactate (HALDOL) injection 5 mg  5 mg Intramuscular TID PRN [name], NP       hydrocortisone cream 1 %   Topical BID Cecilie Lowers, FNP   Given at 07/02/23 1733   hydrOXYzine (ATARAX) tablet 25 mg  25 mg Oral TID PRN [name], DO   25 mg at 07/07/23 0228   LORazepam (ATIVAN) tablet 2 mg  2 mg Oral TID PRN [name], NP   2 mg at  06/03/23 1110   Or   LORazepam (ATIVAN) injection 2 mg  2 mg Intramuscular TID PRN [name], NP       magnesium hydroxide (MILK OF MAGNESIA) suspension 30 mL  30 mL Oral Daily PRN [name], NP   30 mL at 06/23/23 1856   melatonin tablet 5 mg  5 mg Oral QHS Nkwenti, Doris, NP   5 mg at 07/06/23 2108   nicotine polacrilex (NICORETTE) gum 2 mg  2 mg Oral PRN [name], MD   2 mg at 07/06/23 1620   paliperidone (INVEGA) 24 hr tablet 6 mg  6 mg Oral Daily [name],  NP   6 mg at 07/06/23 2108   pantoprazole (PROTONIX) EC tablet 40 mg  40 mg Oral Daily Glynn Freas, Nicole Kindred I, NP   40 mg at 07/07/23 0851   polyethylene glycol (MIRALAX / GLYCOLAX) packet 17 g  17 g Oral Daily PRN [name], MD   17 g at 06/07/23 1847   polyethylene glycol (MIRALAX / GLYCOLAX) packet 17 g  17 g Oral Daily [name], MD   17 g at 06/30/23 0815   sertraline (ZOLOFT) tablet 150 mg  150 mg Oral Daily Massengill, Harrold Donath, MD   150 mg at 07/07/23 0850   SUMAtriptan (IMITREX) tablet 25 mg  25 mg Oral BID PRN [name], NP   25 mg at 07/05/23 1908   traZODone (DESYREL) tablet 50 mg  50 mg Oral QHS [name], NP   50 mg at 07/06/23 2108   Vitamin D (Ergocalciferol) (DRISDOL) 1.25 MG (50000 UNIT) capsule 50,000 Units  50,000 Units Oral Q7 days [name], NP   50,000 Units at 07/06/23 1445   Lab Results: No results found for this or any previous visit (from the past 48 hours).  Blood Alcohol level:  Lab Results  Component Value Date   ETH <10 12/19/2022   ETH <10 11/21/2019   Metabolic Disorder Labs: Lab Results  Component Value Date   HGBA1C 5.6 06/16/2023   MPG 114.02 06/16/2023   MPG 79.58 12/21/2022   No results found for: "PROLACTIN" Lab Results  Component Value Date   CHOL 256 (H) 06/16/2023   TRIG 224 (H) 06/16/2023   HDL 83 06/16/2023   CHOLHDL 3.1 06/16/2023   VLDL 45 (H) 06/16/2023   LDLCALC 128 (H) 06/16/2023   LDLCALC 149 (H) 12/21/2022   Physical  Findings: AIMS: Facial and Oral Movements Muscles of Facial Expression: None Lips and Perioral Area: None Jaw: None Tongue: None,Extremity Movements Upper (arms, wrists, hands, fingers): None Lower (legs, knees, ankles, toes): None, Trunk Movements Neck, shoulders, hips: None, Global Judgements Severity of abnormal movements overall : None Incapacitation due to abnormal movements: None Patient's awareness of abnormal movements: No Awareness, Dental Status Current problems with teeth and/or dentures?: No Does patient usually wear dentures?: No  CIWA:    COWS:     Musculoskeletal: Strength & Muscle Tone: within normal limits Gait & Station: normal Patient leans:  Walks with a slight  limp like one leg is longer than the other.  Psychiatric Specialty Exam:  Presentation  General Appearance:  Casual; Fairly Groomed  Eye Contact: Fair  Speech: Clear and Coherent; Normal Rate  Speech Volume: Normal  Handedness: Right   Mood and Affect  Mood: Depressed  Affect: Congruent   Thought Process  Thought Processes: Coherent; Linear  Descriptions of Associations:Intact  Orientation:Full (Time, Place and Person)  Thought Content:Logical  History of Schizophrenia/Schizoaffective disorder:Yes  Duration of Psychotic Symptoms:Greater than six months  Hallucinations:Hallucinations: None Description of Command Hallucinations: NA Description of Auditory Hallucinations: NA  Ideas of Reference:None  Suicidal Thoughts:Suicidal Thoughts: No  Homicidal Thoughts:Homicidal Thoughts: No   Sensorium  Memory: Immediate Good; Recent Good; Remote Good  Judgment: Fair  Insight: Good   Executive Functions  Concentration: Good  Attention Span: Good  Recall: Good  Fund of Knowledge: Fair  Language: Good  Psychomotor Activity  Psychomotor Activity: Psychomotor Activity: Normal  Assets  Assets: Communication Skills; Desire for Improvement; Financial  Resources/Insurance; Resilience; Social Support  Sleep  Sleep: Sleep: Good Number of Hours of Sleep: 8  Physical Exam: Physical Exam  Vitals and nursing note reviewed.  HENT:     Mouth/Throat:     Pharynx: Oropharynx is clear.  Cardiovascular:     Rate and Rhythm: Normal rate.     Pulses: Normal pulses.  Pulmonary:     Effort: Pulmonary effort is normal.  Genitourinary:    Comments: Deferred Musculoskeletal:        General: Normal range of motion.     Cervical back: Normal range of motion.  Skin:    General: Skin is warm and dry.  Neurological:     General: No focal deficit present.     Mental Status: She is alert and oriented to person, place, and time.    Review of Systems  Constitutional:  Negative for chills, diaphoresis and fever.  HENT:  Negative for congestion and sore throat.   Respiratory:  Negative for cough, shortness of breath and wheezing.   Cardiovascular:  Negative for chest pain and palpitations.  Gastrointestinal:  Negative for abdominal pain, constipation, diarrhea, heartburn, nausea and vomiting.  Musculoskeletal:  Negative for joint pain and myalgias.  Skin:  Negative for itching and rash.  Neurological:  Negative for dizziness, tingling, tremors, sensory change, speech change, focal weakness, seizures, loss of consciousness, weakness and headaches.  Endo/Heme/Allergies:        NKDA  Psychiatric/Behavioral:  Positive for depression. Negative for hallucinations, memory loss, substance abuse and suicidal ideas. The patient is not nervous/anxious and does not have insomnia.    Blood pressure (!) 92/44, pulse (!) 57, temperature 97.7 F (36.5 C), temperature source Oral, resp. rate 16, height 4\' 11"  (1.499 m), weight 63 kg, SpO2 96%. Body mass index is 28.05 kg/m.  Treatment Plan Summary: Daily contact with patient to assess and evaluate symptoms and progress in treatment and Medication management.  Continue inpatient hospitalization.  Will continue  today 07/07/2023 plan as below except where it is noted.   Patient maintained her baseline. She is tolerating her medications well. There is no dangerousness being displayed on the uint. She is stable for care as soon as we finalize safe and appropriate disposition. We will continue her medications and evaluate her further.   Principal/active diagnoses:  Schizoaffective disorder, depressive type GERD (gastroesophageal reflux disease) Intellectual disability Tobacco use disorder  Plan: The risks/benefits/side-effects/alternatives to the medications in use were discussed in detail with the patient and time was given for patient's questions. The patient consents to medication trial.   1.  Continue Invega ER 6 mg at bedtime for mood control. 2.  Continue sertraline 150 mg daily for depression.. 3.  Continue trazodone 50 mg as needed for insomnia.  4.  Continue Buspar 15 mg po bid for anxiety.  5.  Continue Melatonin 5 mg po Q hs for insomnia. 6.  Continue to monitor mood behavior and interaction with others. 7.  Continue to encourage unit groups and therapeutic activities.   Other medical issues:  -Initiated  Lipitor 10 mg po q evenings for hyperlipidemia.  -Continue Colace 100 mg po daily for constipation.  -Continue Vit. B-12 po daily for B-12 deficiency.  -Continue Miralax 17 gm po Q daily/prn for constipation.  -Continue Protonix 40 mg po Q am for acid reflux.  -Continue Vit. D 50,000 units pr daily for Vit. D deficiency.  -Continue Imitrex 25 mg po bid prn for migraine headache.   Continue the agitation protocols as recommended.  Other PRNS -Continue Tylenol 650 mg every 6 hours PRN for mild pain -Continue Maalox 30 ml Q 4 hrs  PRN for indigestion -Continue MOM 30 ml po Q 6 hrs for constipation  Safety and Monitoring: Voluntary admission to inpatient psychiatric unit for safety, stabilization and treatment Daily contact with patient to assess and evaluate symptoms and progress  in treatment Patient's case to be discussed in multi-disciplinary team meeting Observation Level : q15 minute checks Vital signs: q12 hours Precautions: Safety  Discharge Planning: Social work and case management to assist with discharge planning and identification of hospital follow-up needs prior to discharge Estimated LOS: 5-7 days Discharge Concerns: Need to establish a safety plan; Medication compliance and effectiveness Discharge Goals: Return home with outpatient referrals for mental health follow-up including medication management/psychotherapy  Armandina Stammer, NP, pmhnp, fnp-bc.. 07/07/2023, 10:49 AM Patient ID: Larey Brick, female   DOB: 1975-11-18, 47 y.o.   MRN: [id number]

## 2023-07-07 NOTE — Plan of Care (Signed)
Nurse discussed anxiety, depression and coping skills with patient.  

## 2023-07-07 NOTE — Plan of Care (Signed)
°  Problem: Education: °Goal: Ability to make informed decisions regarding treatment will improve °Outcome: Progressing °  °Problem: Coping: °Goal: Coping ability will improve °Outcome: Progressing °  °

## 2023-07-07 NOTE — Group Note (Signed)
LCSW Group Therapy Note  Group Date: 07/07/2023 Start Time: 1000 End Time: 1100   Type of Therapy and Topic:  Group Therapy - How To Cope with Nervousness about Discharge   Participation Level:  Did Not Attend   Description of Group This process group involved identification of patients' feelings about discharge. Some of them are scheduled to be discharged soon, while others are new admissions, but each of them was asked to share thoughts and feelings surrounding discharge from the hospital. One common theme was that they are excited at the prospect of going home, while another was that many of them are apprehensive about sharing why they were hospitalized. Patients were given the opportunity to discuss these feelings with their peers in preparation for discharge.  Therapeutic Goals  Patient will identify their overall feelings about pending discharge. Patient will think about how they might proactively address issues that they believe will once again arise once they get home (i.e. with parents). Patients will participate in discussion about having hope for change.   Summary of Patient Progress:  Did not attend   Therapeutic Modalities Cognitive Behavioral Therapy   Beatris Si, LCSW 07/07/2023  10:50 AM

## 2023-07-07 NOTE — Progress Notes (Signed)
D. Patient up and visible in milieu this evening, spoke about how she is getting frustrated that she is still here and spoke about how she is supposed to go to a group home but is waiting on medicaid to pay for her to go. Patient also spoke about how she feels her medications are no longer effective especially her sleep medications. She spoke about how she has been taking her sleep medications like she is supposed to do but stated "they don't work anymore". Patient did receive all medications without incident this evening, changed bed linens independently and did not verbalize any complaints of pain. A. Medications given as ordered, encouragement and active listening provided as she verbalized her frustrations as still being here. R. Safety maintained at this time, will continue to monitor

## 2023-07-07 NOTE — Progress Notes (Signed)
°   07/07/23 0530  15 Minute Checks  Location Bedroom  Visual Appearance Calm  Behavior Sleeping  Sleep (Behavioral Health Patients Only)  Calculate sleep? (Click Yes once per 24 hr at 0600 safety check) Yes  Documented sleep last 24 hours 7

## 2023-07-07 NOTE — BHH Group Notes (Signed)
Psychoeducational Group Note  Date:  07/07/2023 Time:  8:30p  Group Topic/Focus:  Wrap-Up Group:   The focus of this group is to help patients review their daily goal of treatment and discuss progress on daily workbooks.  Participation Level: Did Not Attend  Participation Quality:  Not Applicable  Affect:  Not Applicable  Cognitive:  Not Applicable  Insight:  Not Applicable  Engagement in Group: Not Applicable  Additional Comments:  Pt chose not to attend wrap up group.  Miabella Shannahan, Sharen Counter 07/07/2023, 8:55 PM

## 2023-07-07 NOTE — Progress Notes (Signed)
D:  Patient denied SI and HI, contracts for safety.  Denied A/V hallucinations.  Denied pain. A:  Medications administered per MD orders.  Emotional support and encouragement given patient. R:  Safety maintained with 15 minute checks.  

## 2023-07-08 ENCOUNTER — Encounter (HOSPITAL_COMMUNITY): Payer: Self-pay

## 2023-07-08 DIAGNOSIS — F251 Schizoaffective disorder, depressive type: Secondary | ICD-10-CM | POA: Diagnosis not present

## 2023-07-08 LAB — URINALYSIS, ROUTINE W REFLEX MICROSCOPIC
Bacteria, UA: NONE SEEN
Bilirubin Urine: NEGATIVE
Glucose, UA: NEGATIVE mg/dL
Ketones, ur: NEGATIVE mg/dL
Nitrite: POSITIVE — AB
Protein, ur: NEGATIVE mg/dL
Specific Gravity, Urine: 1.017 (ref 1.005–1.030)
WBC, UA: >50 WBC/hpf (ref 0–5)
pH: 6 (ref 5.0–8.0)

## 2023-07-08 LAB — T4, FREE: Free T4: 0.56 ng/dL — ABNORMAL LOW (ref 0.61–1.12)

## 2023-07-08 NOTE — Discharge Planning (Signed)
LCSW contacted Provider Support Line for [facility] to inquire if paperwork has been submitted regarding the patient. LCSW spoke with Sherlyn Lees who reports it looks as if the Group Home submitted the paperwork to the wrong office and it was supposed to come to their department. Referral has been received and additional documents have been requested from the Group Home: "Psychological evaluation, plan, [place] SNAP, and any other supporting documentation for this request". At this time, the agency is awaiting the additional documents to be submitted for the patient.   LCSW received phone call from Group Home Representative [name] regarding information requested from [place]. Lucinda asked if LCSW could assist with [place] SNAP and LCSW informed her that LCSW would not be able to assist with that. Group Home requested a copy of the psychological evaluation and reports they will work on everything else. LCSW informed Group Home that the only documentation this Clinician has access to is a MMSE completed by DSS. LCSW unsure if MMSE will stand in place of a psychological eval, as patient tested in a normal range on that document. LCSW informed Group Home that LCSW will follow up with [facility] Manager and DSS to inquire about psychological eval, and will follow back up.   LCSW spoke with Care Manager Jolan and was informed that patient does not have a psychological evaluation on file for the patient, only a consultation note for [facility]. Per [place], she will follow up to see if there is any additional information available.   LCSW contacted Thamas Jaegers with DSS regarding psychological eval. Per Charlcie Cradle, they only have a MMSE on the patient which she tested in a normal range. Document sent to LCSW email. LCSW explored if DSS has spoken with the patient's mother regarding any psychological test completed. Per Charlcie Cradle, she will follow up with the mother to inquire and will follow back up with LCSW.   No other  needs to report at this time. LCSW will continue to follow and provide support to patient.   Fernande Boyden, LCSW Clinical Social Worker [facility] Ph: [phone]

## 2023-07-08 NOTE — Progress Notes (Signed)
Provided patient with urine sample cup and prompted patient to provide urine sample as soon as able. Pt verbalized understanding.

## 2023-07-08 NOTE — B[facility] Group Notes (Signed)
Spirituality group facilitated by Chaplain Katy Elhadj Girton, BCC.  Group Description: Group focused on topic of community. Patients participated in facilitated discussion around topic, connecting with one another around experiences and definitions for community. Group members engaged with visual explorer photos, reflecting on what community looks like for them today. Group engaged in discussion around how their definitions of community are present today in hospital.  Modalities: Psycho-social ed, Adlerian, Narrative, MI  Patient Progress: Did not attend.  

## 2023-07-08 NOTE — Progress Notes (Signed)
Urine specimen collected and placed in lab refrigerator.

## 2023-07-08 NOTE — Plan of Care (Signed)
  Problem: Education: Goal: Ability to make informed decisions regarding treatment will improve Outcome: Progressing   Problem: Coping: Goal: Coping ability will improve Outcome: Progressing   Problem: Health Behavior/Discharge Planning: Goal: Identification of resources available to assist in meeting health care needs will improve Outcome: Progressing   Problem: Medication: Goal: Compliance with prescribed medication regimen will improve Outcome: Progressing   

## 2023-07-08 NOTE — Progress Notes (Signed)
   07/08/23 2000  Psych Admission Type (Psych Patients Only)  Admission Status Involuntary  Psychosocial Assessment  Patient Complaints Anxiety;Depression;Crying spells  Eye Contact Fair  Facial Expression Flat;Sad  Affect Anxious;Sad  Speech Logical/coherent  Interaction Childlike  Motor Activity Tremors  Appearance/Hygiene Disheveled  Behavior Characteristics Cooperative  Mood Depressed;Anxious  Thought Process  Coherency WDL  Content WDL  Delusions None reported or observed  Perception WDL  Hallucination None reported or observed  Judgment Poor  Confusion None  Danger to Self  Current suicidal ideation? Denies  Description of Suicide Plan No Plan  Self-Injurious Behavior No self-injurious ideation or behavior indicators observed or expressed   Agreement Not to Harm Self Yes  Description of Agreement Verbal Contract for Safety  Danger to Others  Danger to Others None reported or observed

## 2023-07-08 NOTE — Group Note (Signed)
Recreation Therapy Group Note   Group Topic:Team Building  Group Date: 07/08/2023 Start Time: 0930 End Time: 1000 Facilitators: Jakobe Blau-McCall, LRT,CTRS Location: [address] Dayroom   Group Topic: Communication, Team Building, Problem Solving  Goal Area(s) Addresses:  Patient will effectively work with peer towards shared goal.  Patient will identify skills used to make activity successful.  Patient will share challenges and verbalize solution-driven approaches used. Patient will identify how skills used during activity can be used to reach post d/c goals.   Intervention: STEM Activity   Group Description: [employer]. Patients were provided the following materials: 2 drinking straws, 5 rubber bands, 5 paper clips, 2 index cards, 2 drinking cups. Using the provided materials patients were asked to build a launching mechanism to launch a ping pong ball across the room, approximately 10 feet. Patients were divided into teams of 3-5. Instructions required all materials be incorporated into the device, functionality of items left to the peer group's discretion.  Education: Pharmacist, community, Scientist, physiological, Air cabin crew, Building control surveyor.   Education Outcome: Acknowledges education/In group clarification offered/Needs additional education.    Affect/Mood: N/A   Participation Level: Did not attend    Clinical Observations/Individualized Feedback:     Plan: Continue to engage patient in RT group sessions 2-3x/week.   Ranessa Kosta-McCall, LRT,CTRS 07/08/2023 11:21 AM

## 2023-07-08 NOTE — BH IP Treatment Plan (Signed)
Interdisciplinary Treatment and Diagnostic Plan Update  07/08/2023 Time of Session: 12:05PM - UPDATE Yolanda [name] MRN: [id number]  Principal Diagnosis: Schizoaffective disorder, depressive type (HCC)  Secondary Diagnoses: Principal Problem:   Schizoaffective disorder, depressive type (HCC) Active Problems:   GERD (gastroesophageal reflux disease)   Intellectual disability   Tobacco use disorder   Current Medications:  Current Facility-Administered Medications  Medication Dose Route Frequency Provider Last Rate Last Admin   acetaminophen (TYLENOL) tablet 650 mg  650 mg Oral Q6H PRN [name], NP   650 mg at 07/05/23 2054   alum & mag hydroxide-simeth (MAALOX/MYLANTA) 200-200-20 MG/5ML suspension 30 mL  30 mL Oral Q4H PRN Onuoha, Chinwendu V, NP   30 mL at 05/31/23 0801   atorvastatin (LIPITOR) tablet 10 mg  10 mg Oral Daily [name], NP   10 mg at 07/07/23 1700   busPIRone (BUSPAR) tablet 15 mg  15 mg Oral BID [name], NP   15 mg at 07/08/23 0759   cyanocobalamin (VITAMIN B12) injection 1,000 mcg  1,000 mcg Intramuscular Q30 days Sarita Bottom, MD   1,000 mcg at 07/03/23 1009   diphenhydrAMINE (BENADRYL) capsule 50 mg  50 mg Oral TID PRN [name], NP   50 mg at 06/03/23 1110   Or   diphenhydrAMINE (BENADRYL) injection 50 mg  50 mg Intramuscular TID PRN [name], NP       docusate sodium (COLACE) capsule 100 mg  100 mg Oral Daily [name], NP   100 mg at 07/08/23 0759   haloperidol (HALDOL) tablet 5 mg  5 mg Oral TID PRN [name], NP   5 mg at 06/03/23 1110   Or   haloperidol lactate (HALDOL) injection 5 mg  5 mg Intramuscular TID PRN [name], NP       hydrocortisone cream 1 %   Topical BID Cecilie Lowers, FNP   Given at 07/02/23 1733   hydrOXYzine (ATARAX) tablet 25 mg  25 mg Oral TID PRN [name], DO   25 mg at 07/07/23 0228   LORazepam (ATIVAN) tablet 2 mg  2 mg Oral TID PRN [name], NP   2 mg at 06/03/23 1110   Or    LORazepam (ATIVAN) injection 2 mg  2 mg Intramuscular TID PRN [name], NP       magnesium hydroxide (MILK OF MAGNESIA) suspension 30 mL  30 mL Oral Daily PRN [name], NP   30 mL at 06/23/23 1856   melatonin tablet 5 mg  5 mg Oral QHS Nkwenti, Doris, NP   5 mg at 07/07/23 2102   nicotine polacrilex (NICORETTE) gum 2 mg  2 mg Oral PRN [name], MD   2 mg at 07/07/23 1107   paliperidone (INVEGA) 24 hr tablet 6 mg  6 mg Oral Daily Nkwenti, Tyler Aas, NP   6 mg at 07/07/23 2102   pantoprazole (PROTONIX) EC tablet 40 mg  40 mg Oral Daily Nwoko, Nicole Kindred I, NP   40 mg at 07/08/23 0759   polyethylene glycol (MIRALAX / GLYCOLAX) packet 17 g  17 g Oral Daily PRN [name], MD   17 g at 06/07/23 1847   polyethylene glycol (MIRALAX / GLYCOLAX) packet 17 g  17 g Oral Daily [name], MD   17 g at 06/30/23 0815   sertraline (ZOLOFT) tablet 150 mg  150 mg Oral Daily Massengill, Harrold Donath, MD   150 mg at 07/08/23 0759   SUMAtriptan (IMITREX) tablet  25 mg  25 mg Oral BID PRN [name], NP   25 mg at 07/05/23 1908   traZODone (DESYREL) tablet 50 mg  50 mg Oral QHS [name], NP   50 mg at 07/07/23 2102   Vitamin D (Ergocalciferol) (DRISDOL) 1.25 MG (50000 UNIT) capsule 50,000 Units  50,000 Units Oral Q7 days [name], NP   50,000 Units at 07/06/23 1445   PTA Medications: Medications Prior to Admission  Medication Sig Dispense Refill Last Dose/Taking   busPIRone (BUSPAR) 15 MG tablet Take 15 mg by mouth 2 (two) times daily. (Patient not taking: Reported on 12/19/2022)      paliperidone (INVEGA SUSTENNA) 156 MG/ML SUSY injection Inject 156 mg into the muscle once. (Patient not taking: Reported on 12/19/2022)      sertraline (ZOLOFT) 50 MG tablet Take 150 mg by mouth daily. (Patient not taking: Reported on 12/19/2022)      traZODone (DESYREL) 100 MG tablet Take 100 mg by mouth at bedtime as needed for sleep. (Patient not taking: Reported on 11/12/2022)       Patient Stressors:  Medication change or noncompliance    Patient Strengths: Forensic psychologist fund of knowledge   Treatment Modalities: Medication Management, Group therapy, Case management,  1 to 1 session with clinician, Psychoeducation, Recreational therapy.   Physician Treatment Plan for Primary Diagnosis: Schizoaffective disorder, depressive type (HCC) Long Term Goal(s): Improvement in symptoms so as ready for discharge   Short Term Goals: Ability to identify changes in lifestyle to reduce recurrence of condition will improve Ability to verbalize feelings will improve  Medication Management: Evaluate patient's response, side effects, and tolerance of medication regimen.  Therapeutic Interventions: 1 to 1 sessions, Unit Group sessions and Medication administration.  Evaluation of Outcomes: Progressing  Physician Treatment Plan for Secondary Diagnosis: Principal Problem:   Schizoaffective disorder, depressive type (HCC) Active Problems:   GERD (gastroesophageal reflux disease)   Intellectual disability   Tobacco use disorder  Long Term Goal(s): Improvement in symptoms so as ready for discharge   Short Term Goals: Ability to identify changes in lifestyle to reduce recurrence of condition will improve Ability to verbalize feelings will improve     Medication Management: Evaluate patient's response, side effects, and tolerance of medication regimen.  Therapeutic Interventions: 1 to 1 sessions, Unit Group sessions and Medication administration.  Evaluation of Outcomes: Progressing   RN Treatment Plan for Primary Diagnosis: Schizoaffective disorder, depressive type (HCC) Long Term Goal(s): Knowledge of disease and therapeutic regimen to maintain health will improve  Short Term Goals: Ability to remain free from injury will improve, Ability to participate in decision making will improve, and Ability to identify and develop effective coping behaviors will improve  Medication  Management: RN will administer medications as ordered by provider, will assess and evaluate patient's response and provide education to patient for prescribed medication. RN will report any adverse and/or side effects to prescribing provider.  Therapeutic Interventions: 1 on 1 counseling sessions, Psychoeducation, Medication administration, Evaluate responses to treatment, Monitor vital signs and CBGs as ordered, Perform/monitor CIWA, COWS, AIMS and Fall Risk screenings as ordered, Perform wound care treatments as ordered.  Evaluation of Outcomes: Progressing   LCSW Treatment Plan for Primary Diagnosis: Schizoaffective disorder, depressive type (HCC) Long Term Goal(s): Safe transition to appropriate next level of care at discharge, Engage patient in therapeutic group addressing interpersonal concerns.  Short Term Goals: Engage patient in aftercare planning with referrals and resources, Increase emotional regulation, and Identify triggers associated with  mental health/substance abuse issues  Therapeutic Interventions: Assess for all discharge needs, 1 to 1 time with Social worker, Explore available resources and support systems, Assess for adequacy in community support network, Educate family and significant other(s) on suicide prevention, Complete Psychosocial Assessment, Interpersonal group therapy.  Evaluation of Outcomes: Progressing   Progress in Treatment: Attending groups: No Participating in groups: No Taking medication as prescribed: Yes. Toleration medication: Yes. Family/Significant other contact made: Yes, individual(s) contacted:  Shizu Sterr [phone] Patient understands diagnosis: Yes. Discussing patient identified problems/goals with staff: Yes. Medical problems stabilized or resolved: Yes. Denies suicidal/homicidal ideation: Yes. Issues/concerns per patient self-inventory: No.   New problem(s) identified: none reported   New Short Term/Long Term Goal(s): medication  stabilization, elimination of SI thoughts, development of comprehensive mental wellness plan.      Patient Goals:  " placement"   Discharge Plan or Barriers: Patient recently admitted. CSW will continue to follow and assess for appropriate referrals and possible discharge planning.      Reason for Continuation of Hospitalization: Depression Suicidal ideation   Estimated Length of Stay: Pt still expected to discharge  to group home awaiting  Single Case agreement and enhanced rate from [facility].  Last 3 [place] Suicide Severity Risk Score: Flowsheet Row Ad[facility] (Current) [facility] from 12[facility]19/2022 in [facility] ED from 11/12/2022 in [facility] Emergency Department at [facility]  C-SSRS RISK CATEGORY High Risk High Risk Moderate Risk       Last [facility] 2/9 Scores:     No data to display          Scribe for Treatment Team: Esmeralda Arthur 07/08/2023 2:47 PM

## 2023-07-08 NOTE — [facility] Group Notes (Signed)
[facility] Group Notes:  (Nursing/MHT/Case Management/Adjunct)  Date:  07/08/2023  Time:  9:17 PM  Type of Therapy:  Psychoeducational Skills  Participation Level:  Did Not Attend  Participation Quality:  Resistant  Affect:  Resistant  Cognitive:  Lacking  Insight:  None  Engagement in Group:  None  Modes of Intervention:  Education  Summary of Progress/Problems: Patient did not attend the evening group.   Hazle Coca S 07/08/2023, 9:17 PM

## 2023-07-08 NOTE — Progress Notes (Addendum)
[facility] MD Progress Note  07/08/2023 11:48 AM Yolanda [name]  MRN:  [id number]  Reason for admission:  History of Present Illness: This is the first psychiatric admission in this [facility] in 49 years for this AA female with an extensive hx of mental illnesses & probable polysubstance use disorders. She is admitted to the [facility] from the [facility] hospital with complain of worsening suicidal ideations with plan to stab herself. Per chart review, patient apparently reported at the ED that she has been depressed for a while & has not been taking her mental health medications. After medical evaluation.clearance, she was transferred to the [facility] for further psychiatric evaluation/treatments.   24 hr chart review: Vital signs remained stable.  Patient is continuing to be compliant with scheduled medications.  PRNs overnight consisted of hydroxyzine, and Nicorette gum.  Patient verbalized concerns last night regarding her frustrations at still being hospitalized here at the hospital. She also expressed that sleep meds are no longer helpful.  No behavioral concerns noted overnight.  None reported from nursing earlier today morning.  Patient assessment: During today's encounter, pt presents with a very depressed mood, affect is congruent. Pt denies SI, but is continuing to report concerns that she is beginning to feel like she might never get discharged from the hospital. She reports that she should no longer still be at the hospital. She denies HI, denies AVH, denies paranoia and there are no signs of delusional thinking. Denies first rank symptoms.  She reports trouble falling asleep, but it seems like pt is staying isolative to her room all day, and mostly sleeping, thereby rendering it difficult for her to sleep at night. She has been educated on the need to come out of her room during the day and engage in unit group activities as it seems like she is also not attending unit group sessions any longer. Pt reports  that her appetite is poor, but states that she typically does not eat breakfast. As per nursing, she has been visible in the milieu during the afternoon periods. Pt denies being in any physical distress at this time, denies medication related side effects.  Pt has been stable for >5 months for discharge, but placement remains problematic at this time. As per CSW, there remains "paperwork" standing in the way of pt being placed at the group home that the DSS has found for her. Medications are stable at this time, and there are no changes or adjustments to be completed at this time, as pt's current depressive state is related to continuous hospitalization. We are continuing to await placement, and pt continues require hospitalization for her safety while this is done as her mental status places her at a risk of danger to herself since she lacks capacity to take care of herself independently in the community.  Labs reviewed: TSH 4.673. FT3 & FT4 ordered. Repeat UA ordered since last one showed a UTI. Will repeat EKG on 01/05.  Principal Problem: Schizoaffective disorder, depressive type (HCC)  Diagnosis: Principal Problem:   Schizoaffective disorder, depressive type (HCC) Active Problems:   GERD (gastroesophageal reflux disease)   Intellectual disability   Tobacco use disorder  Total Time spent with patient:  35 minutes  Past Psychiatric History: See H&P.  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 No pertinent family history.  Family Psychiatric  History: See H&P.  Social History:  Social History    Substance and Sexual Activity  Alcohol Use Yes     Social History   Substance and Sexual Activity  Drug Use Yes   Types: Cocaine, Marijuana    Social History   Socioeconomic History   Marital status: Single    Spouse name: Not on file   Number of children: Not on file   Years of education: Not on file   Highest education level: Not on file  Occupational History   Not on file  Tobacco Use   Smoking status: Every Day   Smokeless tobacco: Not on file  Substance and Sexual Activity   Alcohol use: Yes   Drug use: Yes    Types: Cocaine, Marijuana   Sexual activity: Yes  Other Topics Concern   Not on file  Social History Narrative   Not on file   Social Drivers of Health   Financial Resource Strain: Low Risk  (09/12/2022)   Received from [facility], [facility]   Overall Financial Resource Strain (CARDIA)    Difficulty of Paying Living Expenses: Not hard at all  Food Insecurity: Patient Declined (12/20/2022)   Hunger Vital Sign    Worried About Running Out of Food in the Last Year: Patient declined    Ran Out of Food in the Last Year: Patient declined  Transportation Needs: No Transportation Needs (12/20/2022)   PRAPARE - Administrator, Civil Service (Medical): No    Lack of Transportation (Non-Medical): No  Physical Activity: Not on file  Stress: No Stress Concern Present (07/17/2022)   Received from [facility], [facility] of Occupational Health - Occupational Stress Questionnaire    Feeling of Stress : Not at all  Social Connections: Unknown (07/16/2022)   Received from [facility], [facility]   Social Network    Social Network: Not on file   Additional Social History:   Sleep: Good  Appetite:  Good  Current Medications: Current Facility-Administered Medications  Medication Dose Route Frequency Provider Last Rate Last Admin   acetaminophen (TYLENOL) tablet 650 mg  650 mg Oral Q6H PRN [name], NP   650 mg at  07/05/23 2054   alum & mag hydroxide-simeth (MAALOX/MYLANTA) 200-200-20 MG/5ML suspension 30 mL  30 mL Oral Q4H PRN Onuoha, Chinwendu V, NP   30 mL at 05/31/23 0801   atorvastatin (LIPITOR) tablet 10 mg  10 mg Oral Daily [name], NP   10 mg at 07/07/23 1700   busPIRone (BUSPAR) tablet 15 mg  15 mg Oral BID [name], NP   15 mg at 07/08/23 0759   cyanocobalamin (VITAMIN B12) injection 1,000 mcg  1,000 mcg Intramuscular Q30 days [name], Nadir, MD   1,000 mcg at 07/03/23 1009   diphenhydrAMINE (BENADRYL) capsule 50 mg  50 mg Oral TID PRN [name], NP   50 mg at 06/03/23 1110   Or   diphenhydrAMINE (BENADRYL) injection 50 mg  50 mg Intramuscular TID PRN [name], NP       docusate sodium (COLACE) capsule 100 mg  100 mg Oral Daily [name], NP   100 mg at 07/08/23 [phone]  haloperidol (HALDOL) tablet 5 mg  5 mg Oral TID PRN [name], NP   5 mg at 06/03/23 1110   Or   haloperidol lactate (HALDOL) injection 5 mg  5 mg Intramuscular TID PRN [name], NP       hydrocortisone cream 1 %   Topical BID Cecilie Lowers, FNP   Given at 07/02/23 1733   hydrOXYzine (ATARAX) tablet 25 mg  25 mg Oral TID PRN [name], DO   25 mg at 07/07/23 6440   LORazepam (ATIVAN) tablet 2 mg  2 mg Oral TID PRN [name], NP   2 mg at 06/03/23 1110   Or   LORazepam (ATIVAN) injection 2 mg  2 mg Intramuscular TID PRN [name], NP       magnesium hydroxide (MILK OF MAGNESIA) suspension 30 mL  30 mL Oral Daily PRN [name], NP   30 mL at 06/23/23 1856   melatonin tablet 5 mg  5 mg Oral QHS Adon Gehlhausen, NP   5 mg at 07/07/23 2102   nicotine polacrilex (NICORETTE) gum 2 mg  2 mg Oral PRN [name], MD   2 mg at 07/07/23 1107   paliperidone (INVEGA) 24 hr tablet 6 mg  6 mg Oral Daily [name], NP   6 mg at 07/07/23 2102   pantoprazole (PROTONIX) EC tablet 40 mg  40 mg Oral Daily Nwoko, Nicole Kindred I, NP   40 mg at 07/08/23 0759   polyethylene glycol (MIRALAX /  GLYCOLAX) packet 17 g  17 g Oral Daily PRN [name], MD   17 g at 06/07/23 1847   polyethylene glycol (MIRALAX / GLYCOLAX) packet 17 g  17 g Oral Daily [name], MD   17 g at 06/30/23 0815   sertraline (ZOLOFT) tablet 150 mg  150 mg Oral Daily Massengill, Harrold Donath, MD   150 mg at 07/08/23 0759   SUMAtriptan (IMITREX) tablet 25 mg  25 mg Oral BID PRN [name], NP   25 mg at 07/05/23 1908   traZODone (DESYREL) tablet 50 mg  50 mg Oral QHS [name], NP   50 mg at 07/07/23 2102   Vitamin D (Ergocalciferol) (DRISDOL) 1.25 MG (50000 UNIT) capsule 50,000 Units  50,000 Units Oral Q7 days [name], NP   50,000 Units at 07/06/23 1445   Lab Results: No results found for this or any previous visit (from the past 48 hours).  Blood Alcohol level:  Lab Results  Component Value Date   ETH <10 12/19/2022   ETH <10 11/21/2019   Metabolic Disorder Labs: Lab Results  Component Value Date   HGBA1C 5.6 06/16/2023   MPG 114.02 06/16/2023   MPG 79.58 12/21/2022   No results found for: "PROLACTIN" Lab Results  Component Value Date   CHOL 256 (H) 06/16/2023   TRIG 224 (H) 06/16/2023   HDL 83 06/16/2023   CHOLHDL 3.1 06/16/2023   VLDL 45 (H) 06/16/2023   LDLCALC 128 (H) 06/16/2023   LDLCALC 149 (H) 12/21/2022   Physical Findings: AIMS: Facial and Oral Movements Muscles of Facial Expression: None Lips and Perioral Area: None Jaw: None Tongue: None,Extremity Movements Upper (arms, wrists, hands, fingers): None Lower (legs, knees, ankles, toes): None, Trunk Movements Neck, shoulders, hips: None, Global Judgements Severity of abnormal movements overall : None Incapacitation due to abnormal movements: None Patient's awareness of abnormal movements: No Awareness, Dental Status Current problems with teeth and/or dentures?: No Does patient usually wear dentures?: No  CIWA:    COWS:     Musculoskeletal: Strength & Muscle Tone: within normal limits Gait & Station:  normal Patient leans:  Walks with a slight  limp like one leg is longer than the other.  Psychiatric Specialty Exam:  Presentation  General Appearance:  Disheveled  Eye Contact: Poor  Speech: Clear and Coherent  Speech Volume: Normal  Handedness: Right   Mood and Affect  Mood: Depressed  Affect: Congruent   Thought Process  Thought Processes: Coherent  Descriptions of Associations:Intact  Orientation:Full (Time, Place and Person)  Thought Content:Logical  History of Schizophrenia/Schizoaffective disorder:No  Duration of Psychotic Symptoms:N/A  Hallucinations:Hallucinations: None   Ideas of Reference:None  Suicidal Thoughts:Suicidal Thoughts: No   Homicidal Thoughts:Homicidal Thoughts: No    Sensorium  Memory: Immediate Fair; Remote Poor  Judgment: Fair  Insight: Fair   Chartered certified accountant: Fair  Attention Span: Fair  Recall: [employer] of Knowledge: Fair  Language: Fair  Psychomotor Activity  Psychomotor Activity: Psychomotor Activity: Normal   Assets  Assets: Resilience  Sleep  Sleep: Sleep: Fair   Physical Exam: Physical Exam Vitals and nursing note reviewed.  HENT:     Mouth/Throat:     Pharynx: Oropharynx is clear.  Cardiovascular:     Rate and Rhythm: Normal rate.     Pulses: Normal pulses.  Pulmonary:     Effort: Pulmonary effort is normal.  Genitourinary:    Comments: Deferred Musculoskeletal:        General: Normal range of motion.     Cervical back: Normal range of motion.  Skin:    General: Skin is warm and dry.  Neurological:     General: No focal deficit present.     Mental Status: She is alert and oriented to person, place, and time.    Review of Systems  Constitutional:  Negative for chills, diaphoresis and fever.  HENT:  Negative for congestion and sore throat.   Respiratory:  Negative for cough, shortness of breath and wheezing.   Cardiovascular:  Negative for  chest pain and palpitations.  Gastrointestinal:  Negative for abdominal pain, constipation, diarrhea, heartburn, nausea and vomiting.  Musculoskeletal:  Negative for joint pain and myalgias.  Skin:  Negative for itching and rash.  Neurological:  Negative for dizziness, tingling, tremors, sensory change, speech change, focal weakness, seizures, loss of consciousness, weakness and headaches.  Endo/Heme/Allergies:        NKDA  Psychiatric/Behavioral:  Positive for depression. Negative for hallucinations, memory loss, substance abuse and suicidal ideas. The patient has insomnia. The patient is not nervous/anxious.    Blood pressure 98/61, pulse 79, temperature 97.7 F (36.5 C), temperature source Oral, resp. rate 16, height 4\' 11"  (1.499 m), weight 63 kg, SpO2 96%. Body mass index is 28.05 kg/m.  Treatment Plan Summary: Daily contact with patient to assess and evaluate symptoms and progress in treatment and Medication management.  Continue inpatient hospitalization.  Will continue today 07/08/2023 plan as below except where it is noted.   Patient maintained her baseline. She is tolerating her medications well. There is no dangerousness being displayed on the uint. She is stable for care as soon as we finalize safe and appropriate disposition. We will continue her medications and evaluate her further.   Principal/active diagnoses:  Schizoaffective disorder, depressive type GERD (gastroesophageal reflux disease) Intellectual disability Tobacco use disorder  Plan: The risks/benefits/side-effects/alternatives to the medications in use were discussed in detail with the patient and time was given for patient's questions. The  patient consents to medication trial.   1.  Continue Invega ER 6 mg at bedtime for mood control. 2.  Continue sertraline 150 mg daily for depression.. 3.  Continue trazodone 50 mg as needed for insomnia.  4.  Continue Buspar 15 mg po bid for anxiety.  5.  Continue  Melatonin 5 mg po Q hs for insomnia. 6.  Continue to monitor mood behavior and interaction with others. 7.  Continue to encourage unit groups and therapeutic activities.   Other medical issues:  -Continue  Lipitor 10 mg po q evenings for hyperlipidemia.  -Continue Colace 100 mg po daily for constipation.  -Continue Vit. B-12 po daily for B-12 deficiency.  -Continue Miralax 17 gm po Q daily/prn for constipation.  -Continue Protonix 40 mg po Q am for acid reflux.  -Continue Vit. D 50,000 units pr daily for Vit. D deficiency.  -Continue Imitrex 25 mg po bid prn for migraine headache.   Continue the agitation protocols as recommended.  Other PRNS -Continue Tylenol 650 mg every 6 hours PRN for mild pain -Continue Maalox 30 ml Q 4 hrs PRN for indigestion -Continue MOM 30 ml po Q 6 hrs for constipation  Safety and Monitoring: Voluntary admission to inpatient psychiatric unit for safety, stabilization and treatment Daily contact with patient to assess and evaluate symptoms and progress in treatment Patient's case to be discussed in multi-disciplinary team meeting Observation Level : q15 minute checks Vital signs: q12 hours Precautions: Safety  Discharge Planning: Social work and case management to assist with discharge planning and identification of hospital follow-up needs prior to discharge Estimated LOS: 5-7 days Discharge Concerns: Need to establish a safety plan; Medication compliance and effectiveness Discharge Goals: Return home with outpatient referrals for mental health follow-up including medication management/psychotherapy  [name], NP, pmhnp 07/08/2023, 11:48 AM Patient ID: Yolanda [name], female   DOB: May 31, 1976, 47 y.o.   MRN: [id number] Patient ID: Yolanda [name], female   DOB: 1976-04-13, 47 y.o.   MRN: [id number]

## 2023-07-08 NOTE — Plan of Care (Signed)
  Problem: Education: Goal: Emotional status will improve Outcome: Progressing Goal: Mental status will improve Outcome: Progressing   

## 2023-07-08 NOTE — Progress Notes (Addendum)
Pt denied SI/HI/AVH this morning. Pt has been pleasant, calm, and cooperative throughout the shift. RN provided support and encouragement to patient. Pt given scheduled medications as prescribed. Q15 min checks verified for safety. Patient verbally contracts for safety. Patient compliant with medications and treatment plan. Patient is interacting well on the unit. Pt is safe on the unit.   07/08/23 0800  Psych Admission Type (Psych Patients Only)  Admission Status Involuntary  Psychosocial Assessment  Patient Complaints Anxiety;Depression  Eye Contact Fair  Facial Expression Flat;Sad  Affect Sad  Speech Logical/coherent  Interaction Childlike  Motor Activity Tremors  Appearance/Hygiene Disheveled  Behavior Characteristics Cooperative  Mood Depressed;Anxious  Thought Process  Coherency WDL  Content WDL  Delusions None reported or observed  Perception WDL  Hallucination None reported or observed  Judgment Poor  Confusion None  Danger to Self  Current suicidal ideation? Denies  Description of Suicide Plan No plan  Self-Injurious Behavior No self-injurious ideation or behavior indicators observed or expressed   Agreement Not to Harm Self Yes  Description of Agreement Verbal  Danger to Others  Danger to Others None reported or observed

## 2023-07-08 NOTE — Plan of Care (Signed)
  Problem: Coping: Goal: Coping ability will improve Outcome: Progressing   Problem: Coping: Goal: Ability to verbalize frustrations and anger appropriately will improve Outcome: Adequate for Discharge Goal: Ability to demonstrate self-control will improve Outcome: Adequate for Discharge   Problem: Safety: Goal: Periods of time without injury will increase Outcome: Adequate for Discharge   Problem: Health Behavior/Discharge Planning: Goal: Identification of resources available to assist in meeting health care needs will improve Outcome: Adequate for Discharge   Problem: Medication: Goal: Compliance with prescribed medication regimen will improve Outcome: Adequate for Discharge

## 2023-07-09 DIAGNOSIS — F251 Schizoaffective disorder, depressive type: Secondary | ICD-10-CM | POA: Diagnosis not present

## 2023-07-09 MED ORDER — SULFAMETHOXAZOLE-TRIMETHOPRIM 800-160 MG PO TABS
1.0000 | ORAL_TABLET | Freq: Two times a day (BID) | ORAL | Status: AC
  Administered 2023-07-09 – 2023-07-15 (×14): 1 via ORAL
  Filled 2023-07-09 (×14): qty 1

## 2023-07-09 MED ORDER — SULFAMETHOXAZOLE-TRIMETHOPRIM 800-160 MG PO TABS: 1.0000 | ORAL_TABLET | Freq: Two times a day (BID) | ORAL | Status: DC

## 2023-07-09 NOTE — Progress Notes (Signed)
   07/09/23 2100  Psychosocial Assessment  Patient Complaints Anxiety;Depression  Eye Contact Fair  Facial Expression Flat;Sad  Affect Anxious;Sad  Speech Logical/coherent  Interaction Childlike  Motor Activity Tremors  Appearance/Hygiene Unremarkable  Behavior Characteristics Cooperative  Mood Depressed;Anxious  Thought Process  Coherency WDL  Content WDL  Delusions None reported or observed  Perception WDL  Hallucination None reported or observed  Judgment Poor  Confusion None  Danger to Self  Current suicidal ideation? Denies  Description of Suicide Plan None  Self-Injurious Behavior No self-injurious ideation or behavior indicators observed or expressed   Agreement Not to Harm Self Yes  Description of Agreement Verbal Contract for Safety  Danger to Others  Danger to Others None reported or observed

## 2023-07-09 NOTE — Group Note (Signed)
 LCSW Group Therapy Note  Group Date: 07/09/2023 Start Time: 1100 End Time: 1200   Type of Therapy and Topic:  Group Therapy:  Stages of Change  Participation Level:  Did not attend  Description of Group: Discussed 5 stages of change, for example, when developing new habits and overcoming addictions:  pre-contemplation, contemplation, preparation, action and maintenance.  Discussed handout, why change is hard and completed a worksheet.  Therapeutic Goals: Patient will identify stage of change they are experiencing. Patient will recognize obstacles and triggers when making progress.      Summary of Patient Progress:   Did not attend  Therapeutic Modalities:  Elements of Motivational Interviewing (MI)  Amiria Orrison O Sherri Levenhagen, LCSWA 07/09/2023  2:22 PM

## 2023-07-09 NOTE — Plan of Care (Signed)
   Problem: Education: Goal: Ability to make informed decisions regarding treatment will improve Outcome: Progressing   Problem: Coping: Goal: Coping ability will improve Outcome: Progressing

## 2023-07-09 NOTE — Plan of Care (Signed)
  Problem: Education: Goal: Ability to make informed decisions regarding treatment will improve Outcome: Progressing   Problem: Coping: Goal: Coping ability will improve Outcome: Progressing   Problem: Health Behavior/Discharge Planning: Goal: Identification of resources available to assist in meeting health care needs will improve Outcome: Progressing   Problem: Medication: Goal: Compliance with prescribed medication regimen will improve Outcome: Progressing   Problem: Self-Concept: Goal: Ability to disclose and discuss suicidal ideas will improve Outcome: Progressing   

## 2023-07-09 NOTE — Progress Notes (Signed)
 [facility] MD Progress Note  07/09/2023 12:46 PM Yolanda [name]  MRN:  [id number]  Reason for admission:  History of Present Illness: This is the first psychiatric admission in this [facility] in 49 years for this AA female with an extensive hx of mental illnesses & probable polysubstance use disorders. She is admitted to the [facility] from the [facility] hospital with complain of worsening suicidal ideations with plan to stab herself. Per chart review, patient apparently reported at the ED that she has been depressed for a while & has not been taking her mental health medications. After medical evaluation.clearance, she was transferred to the [facility] for further psychiatric evaluation/treatments.   24 hr chart review: Vital signs remained stable.  Patient is continuing to be compliant with scheduled medications.  PRNs overnight consisted of hydroxyzine , and Nicorette  gum.  Slept 7.85 hrs as per nursing reports & documentation.  Patient assessment: There are no changes in assessment findings today as compared to yesterday's. Pt is continuing to present with a flat affect and depressed mood, attention to personal hygiene and grooming is poor & she appears discheveled and unkempt. The need to tend to personal hygiene and grooming reiterated. Her eye contact is minimal, speech is low pitched, slightly garbled, which is her baseline, but comprehensible. Thought contents are organized and logical, and pt currently denies SI/HI/AVH or paranoia. There is no evidence of delusional thoughts.    She continues to verbalize her dislike at the fact that she is continuing to be hospitalized. States that she feels as though she will never get out of the hospital, and has been staying isolative to her room and not attending unit group activities as a result. Pt is also hoarding clothing and food in her room, which is rendering the room too unsanitary with a potential of pest infestation. Nursing has been notified of this, as well as the  fact that pt should not be allowed to eat meals in her room, and to go to the cafeteria like all other patients do for meals. Pt should be allowed to have a maximum of 5 days worth of clothing in her room at a time, and her laundry should be completed on time to allow for this, and all other items in the room should be taken off unit and locked until pt is discharged so that the room can remain clean and not infested by pests.    We are continuing to await placement, and pt continues require hospitalization for her safety while this is done as her mental status places her at a risk of danger to herself since she lacks capacity to take care of herself independently in the community. CSW is following up on the paperwork that is being required to enhance pt's placement into a LT residential living environment. (Please see today's note from CSW).  Pt denies all medication related side effects, and there are no changes or adjustments in medications warranted at this time. Will continue all as listed below.  Labs reviewed: TSH 4.673. FT3 & FT4 ordered yesterday due to elevated TSH. FT4 is subtherapeutic at 0.56, and FT3 is pending. Repeat UA ordered yesterday since last one showed a UTI. Repeat UA with large leukocytes, positive for nitrites. Will treat with Bactrim  for UTI. Will repeat EKG on 01/05.  Principal Problem: Schizoaffective disorder, depressive type (HCC)  Diagnosis: Principal Problem:   Schizoaffective disorder, depressive type (HCC) Active Problems:   GERD (gastroesophageal reflux disease)   Intellectual disability   Tobacco use  disorder  Total Time spent with patient:  35 minutes  Past Psychiatric History: See H&P.  Past Medical History:  Past Medical History:  Diagnosis Date   Bipolar affect, depressed (HCC)    Constipation 08/17/2022   Depression    Falls 07/21/2022   Fracture of femoral neck, right, closed (HCC) 01/17/2022   Herpes simplex 08/22/2017   Open fracture  dislocation of right elbow joint 01/17/2022    Past Surgical History:  Procedure Laterality Date   NO PAST SURGERIES     SALPINGECTOMY     Family History: History reviewed. No pertinent family history.  Family Psychiatric  History: See H&P.  Social History:  Social History   Substance and Sexual Activity  Alcohol Use Yes     Social History   Substance and Sexual Activity  Drug Use Yes   Types: Cocaine, Marijuana    Social History   Socioeconomic History   Marital status: Single    Spouse name: Not on file   Number of children: Not on file   Years of education: Not on file   Highest education level: Not on file  Occupational History   Not on file  Tobacco Use   Smoking status: Every Day   Smokeless tobacco: Not on file  Substance and Sexual Activity   Alcohol use: Yes   Drug use: Yes    Types: Cocaine, Marijuana   Sexual activity: Yes  Other Topics Concern   Not on file  Social History Narrative   Not on file   Social Drivers of Health   Financial Resource Strain: Low Risk  (09/12/2022)   Received from [facility], [facility]   Overall Financial Resource Strain (CARDIA)    Difficulty of Paying Living Expenses: Not hard at all  Food Insecurity: Patient Declined (12/20/2022)   Hunger Vital Sign    Worried About Running Out of Food in the Last Year: Patient declined    Ran Out of Food in the Last Year: Patient declined  Transportation Needs: No Transportation Needs (12/20/2022)   PRAPARE - Administrator, Civil Service (Medical): No    Lack of Transportation (Non-Medical): No  Physical Activity: Not on file  Stress: No Stress Concern Present (07/17/2022)   Received from [facility], [facility] - Occupational Stress Questionnaire    Feeling of Stress : Not at all  Social Connections: Unknown (07/16/2022)   Received from [facility], [facility]   Social Network    Social Network: Not on file    Additional Social History:   Sleep: Good  Appetite:  Good  Current Medications: Current Facility-Administered Medications  Medication Dose Route Frequency Provider Last Rate Last Admin   acetaminophen  (TYLENOL ) tablet 650 mg  650 mg Oral Q6H PRN Trudy Carwin, Yolanda [name]   650 mg at 07/05/23 2054   alum & mag hydroxide-simeth (MAALOX/MYLANTA) 200-200-20 MG/5ML suspension 30 mL  30 mL Oral Q4H PRN Onuoha, Chinwendu V, Yolanda [name]   30 mL at 05/31/23 0801   atorvastatin  (LIPITOR) tablet 10 mg  10 mg Oral Daily Nwoko, Agnes I, Yolanda [name]   10 mg at 07/08/23 1656   busPIRone  (BUSPAR ) tablet 15 mg  15 mg Oral BID Nwoko, Agnes I, Yolanda [name]   15 mg at 07/09/23 1115   cyanocobalamin  (VITAMIN B12) injection 1,000 mcg  1,000 mcg Intramuscular Q30 days Evelena, Nadir, MD   1,000 mcg at 07/03/23 1009   diphenhydrAMINE  (BENADRYL ) capsule 50 mg  50 mg  Oral TID PRN Trudy Carwin, Yolanda [name]   50 mg at 06/03/23 1110   Or   diphenhydrAMINE  (BENADRYL ) injection 50 mg  50 mg Intramuscular TID PRN Trudy Carwin, Yolanda [name]       docusate sodium  (COLACE) capsule 100 mg  100 mg Oral Daily Tex Drilling, Yolanda [name]   100 mg at 07/09/23 1115   haloperidol  (HALDOL ) tablet 5 mg  5 mg Oral TID PRN Collene Gouge I, Yolanda [name]   5 mg at 06/03/23 1110   Or   haloperidol  lactate (HALDOL ) injection 5 mg  5 mg Intramuscular TID PRN Collene Gouge I, Yolanda [name]       hydrocortisone  cream 1 %   Topical BID Ntuen, Tina C, FNP   Given at 07/02/23 1733   hydrOXYzine  (ATARAX ) tablet 25 mg  25 mg Oral TID PRN Nguyen, Julie, DO   25 mg at 07/07/23 9771   LORazepam  (ATIVAN ) tablet 2 mg  2 mg Oral TID PRN Trudy Carwin, Yolanda [name]   2 mg at 06/03/23 1110   Or   LORazepam  (ATIVAN ) injection 2 mg  2 mg Intramuscular TID PRN Trudy Carwin, Yolanda [name]       magnesium  hydroxide (MILK OF MAGNESIA) suspension 30 mL  30 mL Oral Daily PRN Trudy Carwin, Yolanda [name]   30 mL at 06/23/23 1856   melatonin tablet 5 mg  5 mg Oral QHS Tex Drilling, Yolanda [name]   5 mg at 07/08/23 2106   nicotine  polacrilex (NICORETTE ) gum 2 mg  2 mg Oral PRN Goli,  Veeraindar, MD   2 mg at 07/09/23 1129   paliperidone  (INVEGA ) 24 hr tablet 6 mg  6 mg Oral Daily Tex Drilling, Yolanda [name]   6 mg at 07/08/23 2106   pantoprazole  (PROTONIX ) EC tablet 40 mg  40 mg Oral Daily Nwoko, Agnes I, Yolanda [name]   40 mg at 07/09/23 1115   polyethylene glycol (MIRALAX  / GLYCOLAX ) packet 17 g  17 g Oral Daily PRN Goli, Veeraindar, MD   17 g at 06/07/23 1847   polyethylene glycol (MIRALAX  / GLYCOLAX ) packet 17 g  17 g Oral Daily Goli, Veeraindar, MD   17 g at 06/30/23 0815   sertraline  (ZOLOFT ) tablet 150 mg  150 mg Oral Daily Massengill, Rankin, MD   150 mg at 07/09/23 1115   sulfamethoxazole -trimethoprim  (BACTRIM  DS) 800-160 MG per tablet 1 tablet  1 tablet Oral Q12H Kenya Shiraishi, Yolanda [name]       SUMAtriptan  (IMITREX ) tablet 25 mg  25 mg Oral BID PRN Tex Drilling, Yolanda [name]   25 mg at 07/05/23 1908   traZODone  (DESYREL ) tablet 50 mg  50 mg Oral QHS Tex Drilling, Yolanda [name]   50 mg at 07/08/23 2106   Vitamin D  (Ergocalciferol ) (DRISDOL ) 1.25 MG (50000 UNIT) capsule 50,000 Units  50,000 Units Oral Q7 days Tex Drilling, Yolanda [name]   50,000 Units at 07/06/23 1445   Lab Results:  Results for orders placed or performed during the hospital encounter of 12/20/22 (from the past 48 hours)  Urinalysis, Routine w reflex microscopic -Urine, Clean Catch     Status: Abnormal   Collection Time: 07/08/23  4:14 PM  Result Value Ref Range   Color, Urine YELLOW YELLOW   APPearance HAZY (A) CLEAR   Specific Gravity, Urine 1.017 1.005 - 1.030   pH 6.0 5.0 - 8.0   Glucose, UA NEGATIVE NEGATIVE mg/dL   Hgb urine dipstick SMALL (A) NEGATIVE   Bilirubin Urine NEGATIVE NEGATIVE   Ketones, ur NEGATIVE NEGATIVE mg/dL   Protein, ur NEGATIVE NEGATIVE mg/dL  Nitrite POSITIVE (A) NEGATIVE   Leukocytes,Ua LARGE (A) NEGATIVE   RBC / HPF 6-10 0 - 5 RBC/hpf   WBC, UA >50 0 - 5 WBC/hpf   Bacteria, UA NONE SEEN NONE SEEN   Squamous Epithelial / HPF 6-10 0 - 5 /HPF   Mucus PRESENT    Non Squamous Epithelial 0-5 (A) NONE SEEN    Comment:  Performed at [facility], [address] [place], [place] [address]  T4, free     Status: Abnormal   Collection Time: 07/08/23  6:51 PM  Result Value Ref Range   Free T4 0.56 (L) 0.61 - 1.12 ng/dL    Comment: (NOTE) Biotin ingestion may interfere with free T4 tests. If the results are inconsistent with the TSH level, previous test results, or the clinical presentation, then consider biotin interference. If needed, order repeat testing after stopping biotin. Performed at [facility], [address] [place], [place] [address]     Blood Alcohol level:  Lab Results  Component Value Date   [facility] <10 12/19/2022   ETH <10 11/21/2019   Metabolic Disorder Labs: Lab Results  Component Value Date   HGBA1C 5.6 06/16/2023   MPG 114.02 06/16/2023   MPG 79.58 12/21/2022   No results found for: PROLACTIN Lab Results  Component Value Date   CHOL 256 (H) 06/16/2023   TRIG 224 (H) 06/16/2023   HDL 83 06/16/2023   CHOLHDL 3.1 06/16/2023   VLDL 45 (H) 06/16/2023   LDLCALC 128 (H) 06/16/2023   LDLCALC 149 (H) 12/21/2022   Physical Findings: AIMS: Facial and Oral Movements Muscles of Facial Expression: None Lips and Perioral Area: None Jaw: None Tongue: None,Extremity Movements Upper (arms, wrists, hands, fingers): None Lower (legs, knees, ankles, toes): None, Trunk Movements Neck, shoulders, hips: None, Global Judgements Severity of abnormal movements overall : None Incapacitation due to abnormal movements: None Patient's awareness of abnormal movements: No Awareness, Dental Status Current problems with teeth and/or dentures?: No Does patient usually wear dentures?: No  CIWA:    COWS:     Musculoskeletal: Strength & Muscle Tone: within normal limits Gait & Station: normal Patient leans:  Walks with a slight  limp like one leg is longer than the other.  Psychiatric Specialty Exam:  Presentation  General Appearance:  Fairly Groomed  Eye  Contact: Minimal  Speech: Garbled  Speech Volume: Decreased  Handedness: Right   Mood and Affect  Mood: Depressed  Affect: Congruent   Thought Process  Thought Processes: Coherent  Descriptions of Associations:Intact  Orientation:Partial  Thought Content:Logical  History of Schizophrenia/Schizoaffective disorder:No  Duration of Psychotic Symptoms:N/A  Hallucinations:Hallucinations: None   Ideas of Reference:None  Suicidal Thoughts:Suicidal Thoughts: No   Homicidal Thoughts:Homicidal Thoughts: No    Sensorium  Memory: Immediate Fair  Judgment: Poor  Insight: Poor   Executive Functions  Concentration: Fair  Attention Span: Poor  Recall: Poor  Fund of Knowledge: Poor  Language: Fair  Psychomotor Activity  Psychomotor Activity: Psychomotor Activity: Normal   Assets  Assets: Resilience  Sleep  Sleep: Sleep: Good   Physical Exam: Physical Exam Vitals and nursing note reviewed.  HENT:     Mouth/Throat:     Pharynx: Oropharynx is clear.  Cardiovascular:     Rate and Rhythm: Normal rate.     Pulses: Normal pulses.  Pulmonary:     Effort: Pulmonary effort is normal.  Genitourinary:    Comments: Deferred Musculoskeletal:        General: Normal  range of motion.     Cervical back: Normal range of motion.  Skin:    General: Skin is warm and dry.  Neurological:     General: No focal deficit present.     Mental Status: She is alert and oriented to person, place, and time.    Review of Systems  Constitutional:  Negative for chills, diaphoresis and fever.  HENT:  Negative for congestion and sore throat.   Respiratory:  Negative for cough, shortness of breath and wheezing.   Cardiovascular:  Negative for chest pain and palpitations.  Gastrointestinal:  Negative for abdominal pain, constipation, diarrhea, heartburn, nausea and vomiting.  Musculoskeletal:  Negative for joint pain and myalgias.  Skin:  Negative for  itching and rash.  Neurological:  Negative for dizziness, tingling, tremors, sensory change, speech change, focal weakness, seizures, loss of consciousness, weakness and headaches.  Endo/Heme/Allergies:        NKDA  Psychiatric/Behavioral:  Positive for depression. Negative for hallucinations, memory loss, substance abuse and suicidal ideas. The patient has insomnia. The patient is not nervous/anxious.    Blood pressure 104/76, pulse 71, temperature 97.7 F (36.5 C), temperature source Oral, resp. rate 16, height 4' 11 (1.499 m), weight 63 kg, SpO2 99%. Body mass index is 28.05 kg/m.  Treatment Plan Summary: Daily contact with patient to assess and evaluate symptoms and progress in treatment and Medication management.  Continue inpatient hospitalization.  Will continue today 07/09/2023 plan as below except where it is noted.   Patient maintained her baseline. She is tolerating her medications well. There is no dangerousness being displayed on the uint. She is stable for care as soon as we finalize safe and appropriate disposition. We will continue her medications and evaluate her further.   Principal/active diagnoses:  Schizoaffective disorder, depressive type GERD (gastroesophageal reflux disease) Intellectual disability Tobacco use disorder  Plan: The risks/benefits/side-effects/alternatives to the medications in use were discussed in detail with the patient and time was given for patient's questions. The patient consents to medication trial.   1.  Continue Invega  ER 6 mg at bedtime for mood control. 2.  Continue sertraline  150 mg daily for depression.. 3.  Continue trazodone  50 mg as needed for insomnia.  4.  Continue Buspar  15 mg po bid for anxiety.  5.  Continue Melatonin 5 mg po Q hs for insomnia. 6.  Continue to monitor mood behavior and interaction with others. 7.  Continue to encourage unit groups and therapeutic activities.  -Start Bactrim -DS 1 tab BID x 7 days for  UTI  Other medical issues:  -Continue  Lipitor 10 mg po q evenings for hyperlipidemia.  -Continue Colace 100 mg po daily for constipation.  -Continue Vit. B-12 po daily for B-12 deficiency.  -Continue Miralax  17 gm po Q daily/prn for constipation.  -Continue Protonix  40 mg po Q am for acid reflux.  -Continue Vit. D 50,000 units pr daily for Vit. D deficiency.  -Continue Imitrex  25 mg po bid prn for migraine headache.   Continue the agitation protocols as recommended.  Other PRNS -Continue Tylenol  650 mg every 6 hours PRN for mild pain -Continue Maalox 30 ml Q 4 hrs PRN for indigestion -Continue MOM 30 ml po Q 6 hrs for constipation  Safety and Monitoring: Voluntary admission to inpatient psychiatric unit for safety, stabilization and treatment Daily contact with patient to assess and evaluate symptoms and progress in treatment Patient's case to be discussed in multi-disciplinary team meeting Observation Level : q15 minute checks Vital signs:  q12 hours Precautions: Safety  Discharge Planning: Social work and case management to assist with discharge planning and identification of hospital follow-up needs prior to discharge Estimated LOS: 5-7 days Discharge Concerns: Need to establish a safety plan; Medication compliance and effectiveness Discharge Goals: Return home with outpatient referrals for mental health follow-up including medication management/psychotherapy  Yolanda Snell, Yolanda [name], Yolanda [name] 07/09/2023, 12:46 PM Patient ID: Yolanda [name], female   DOB: 07/12/1975, 47 y.o.   MRN: [id number]

## 2023-07-09 NOTE — Group Note (Signed)
 Recreation Therapy Group Note   Group Topic:Animal Assisted Therapy   Group Date: 07/09/2023 Start Time: 0946 End Time: 1030 Facilitators: Venicia Vandall-McCall, LRT,CTRS Location: [address] Dayroom   Animal-Assisted Activity (AAA) Program Checklist/Progress Notes Patient Eligibility Criteria Checklist & Daily Group note for Rec Tx Intervention  AAA/T Program Assumption of Risk Form signed by Patient/ or Parent Legal Guardian Yes  Patient understands his/her participation is voluntary Yes  Education: Charity Fundraiser, Appropriate Animal Interaction   Education Outcome: Acknowledges education.    Affect/Mood: N/A   Participation Level: Did not attend    Clinical Observations/Individualized Feedback:      Plan: Continue to engage patient in RT group sessions 2-3x/week.   Taler Kushner-McCall, LRT,CTRS 07/09/2023 12:00 PM

## 2023-07-09 NOTE — [facility] Group Notes (Signed)
 [facility] Group Notes:  (Nursing/MHT/Case Management/Adjunct)  Date:  07/09/2023  Time:  9:41 PM  Type of Therapy:   Wrap-up group  Participation Level:  Active  Participation Quality:  Appropriate  Affect:  Appropriate  Cognitive:  Appropriate  Insight:  Appropriate  Engagement in Group:  Engaged  Modes of Intervention:  Education  Summary of Progress/Problems: Goal to attend group. Rated day 7/10.  Yolanda [name] 07/09/2023, 9:41 PM

## 2023-07-09 NOTE — Progress Notes (Signed)
   07/09/23 1100  Psychosocial Assessment  Patient Complaints Anxiety;Depression;Crying spells;Hopelessness  Eye Contact Fair  Facial Expression Flat;Sad  Affect Anxious;Sad  Speech Logical/coherent  Interaction Childlike  Motor Activity Tremors  Appearance/Hygiene Disheveled  Behavior Characteristics Cooperative  Mood Depressed;Anxious  Thought Process  Coherency WDL  Content WDL  Delusions None reported or observed  Perception WDL  Hallucination None reported or observed  Judgment Poor  Confusion None  Danger to Self  Current suicidal ideation? Denies  Self-Injurious Behavior No self-injurious ideation or behavior indicators observed or expressed   Agreement Not to Harm Self Yes  Description of Agreement verbal contract  Danger to Others  Danger to Others None reported or observed

## 2023-07-09 NOTE — Progress Notes (Signed)
 Chaplain met with Yolanda [name] who requested to meet for support.  Yolanda [name] is feeling helpless and distressed by the fact that she feels she wants to go home to be with the Lord.  She also expressed regret that she sometimes says mean things to people even though she doesn't mean to. Chaplain provided listening, supportive presence, and prayer and consulted with staff about her thoughts around death.  [address], Bcc Pager, [phone]

## 2023-07-10 DIAGNOSIS — F251 Schizoaffective disorder, depressive type: Secondary | ICD-10-CM | POA: Diagnosis not present

## 2023-07-10 LAB — T3, FREE: T3, Free: 1.9 pg/mL — ABNORMAL LOW (ref 2.0–4.4)

## 2023-07-10 MED ORDER — PERMETHRIN 1 % EX LIQD
Freq: Once | CUTANEOUS | Status: DC
  Filled 2023-07-10: qty 59

## 2023-07-10 NOTE — Progress Notes (Signed)
   07/10/23 1800  Danger to Self  Current suicidal ideation? Passive  Description of Suicide Plan no plan  Agreement Not to Harm Self Yes  Description of Agreement verbal

## 2023-07-10 NOTE — Discharge Planning (Signed)
 LCSW received phone call from Group Home Owner Lucinda Davis on yesterday regarding updates. Per Tereso, the group home has decided not to move forward with paperwork until a psychological evaluation is received on the patient. Per Tereso, that is requested from [facility] for approval of placement. Lucinda made aware that this Clinician does not have access to a psychological evaluation as it does not appear that one was completed while in [facility] care. LCSW informed Lucinda that LCSW will follow up with DSS Representative to explore if one has been completed on the patient prior to admission at [name]. LCSW will provide updates as received.   LCSW spoke with DSS Representative Arch Ada about current barriers for placement. Arch was informed that Group Home has decided to not continue with paperwork until a psychological evaluation is received due to it being requested from [facility] for approval. Per Arch, she will request documents from [employer], [facility], and [facility] in [place] to see if one has been completed. Arch was informed that if one has not been completed then the facility would run into the same issue with other group home placement options for patient. Arch reports she has been contacted by [employer] regarding the patient needing to discharge by the end of this week, as [facility] has explore every option for placement with no success. Per Arch, they do not have emergency placement for adults, however her supervisor is aware of the case and updates will be provided as received.   Lakita sent documents to [employer] email from [facility] and [facility] and from observation, no psychological evaluation has been completed on the patient. Update to be provided to [facility] Supervisor.   LCSW is awaiting updates from leadership regarding next steps for the patient. Updates to be provided as received.   Merlynn Lazier, LCSW Clinical Social Worker [facility] [facility] Ph:  [phone]

## 2023-07-10 NOTE — Progress Notes (Signed)
 This RN and a second RN found head lice in patient's hair right before shift change.

## 2023-07-10 NOTE — Group Note (Signed)
 Recreation Therapy Group Note   Group Topic:General Recreation  Group Date: 07/10/2023 Start Time: 0930 End Time: 1005 Facilitators: Analiyah Lechuga-McCall, LRT,CTRS Location: [address] Dayroom   Group Topic/Focus: General Recreation   Goal Area(s) Addresses:  Patient will use appropriate interactions with peers.   Patient will choose appropriate songs.  Intervention: Research scientist (medical), Television  Group Description: Karaoke. Patients took turns selecting songs that were appropriate to perform for their enjoyment with peers.      Affect/Mood: Flat   Participation Level: Minimal   Participation Quality: Independent   Behavior: On-looking   Speech/Thought Process: Relevant   Insight: Fair   Judgement: Fair    Modes of Intervention: Music   Patient Response to Interventions:  Attentive   Education Outcome:  In group clarification offered    Clinical Observations/Individualized Feedback: Pt sat quietly by the window. Pt watched as peers performed their songs. Pt brightened up as group went along. Pt was appropriate during group.     Plan: Continue to engage patient in RT group sessions 2-3x/week.   Loras Grieshop-McCall, LRT, CTRS 07/10/2023 12:27 PM

## 2023-07-10 NOTE — Plan of Care (Signed)
  Problem: Education: Goal: Knowledge of [facility] Education information/materials will improve Outcome: Progressing Goal: Emotional status will improve Outcome: Progressing Goal: Mental status will improve Outcome: Progressing Goal: Verbalization of understanding the information provided will improve Outcome: Progressing   Problem: Coping: Goal: Ability to demonstrate self-control will improve Outcome: Progressing   Problem: Safety: Goal: Periods of time without injury will increase Outcome: Progressing   Problem: Coping: Goal: Coping ability will improve Outcome: Progressing   Problem: Medication: Goal: Compliance with prescribed medication regimen will improve Outcome: Progressing

## 2023-07-10 NOTE — [facility] Group Notes (Signed)
 [facility] Group Notes:  (Nursing/MHT/Case Management/Adjunct)  Date:  07/10/2023  Time:  8:25 PM  Type of Therapy:   NA Group  Participation Level:  Did Not Attend   Shown [place] 07/10/2023, 8:25 PM

## 2023-07-10 NOTE — Progress Notes (Signed)
   07/10/23 1000  Psych Admission Type (Psych Patients Only)  Admission Status Involuntary  Psychosocial Assessment  Patient Complaints Anxiety;Depression  Eye Contact Fair  Facial Expression Flat;Sad  Affect Anxious;Sad  Speech Logical/coherent  Interaction Childlike  Motor Activity Tremors  Appearance/Hygiene Disheveled  Behavior Characteristics Cooperative  Mood Depressed;Anxious  Thought Process  Coherency WDL  Content WDL  Delusions None reported or observed  Perception WDL  Hallucination None reported or observed  Judgment Poor  Confusion None  Danger to Self  Current suicidal ideation? Denies  Self-Injurious Behavior No self-injurious ideation or behavior indicators observed or expressed   Agreement Not to Harm Self Yes  Description of Agreement verbal  Danger to Others  Danger to Others None reported or observed

## 2023-07-10 NOTE — Progress Notes (Signed)
 [facility] Progress Note  07/10/2023 4:05 PM [name]  MRN:  [id number]  Reason for admission:  History of Present Illness: This is the first psychiatric admission in this [facility] in 49 years for this AA female with an extensive hx of mental illnesses & probable polysubstance use disorders. She is admitted to the [facility] from the [facility] hospital with complain of worsening suicidal ideations with plan to stab herself. Per chart review, patient apparently reported at the ED that she has been depressed for a while & has not been taking her mental health medications. After medical evaluation.clearance, she was transferred to the [facility] for further psychiatric evaluation/treatments.   24 hr chart review: Vital signs remained stable.  Patient is continuing to be compliant with scheduled medications.  Slept for 8 hrs as per nursing reports. Denies any burning with urination.  Daily notes: [name] remains without any changes in her mental state. Patient has remains & may have reached her baseline, however, she is still in the hospital because she is awaiting group home placement. She has been accepted by one group home, however, they are waiting for some paper work from the DSS. The thanks giving/christmas holidays seem to have prolonged this placement as the DSS workers are probably on Christmas break. Patient remains anxious/saddened about still remaining in the hospital all through these times. However, she remains optimistic & cooperative in taking her medications. Denies any side effects. She was started on bactrim  DS yesterday for uti. She denies any burning with urination. She remains afebrile. Fluids is encouraged. She currently denies any SIHI, AVH, delusional thoughts or paranoia. She does not appear to be responding to any internal stimuli. Staff continues to support & provide encouragement. Continue current plan of care as already in progress.   Per previous reports: We are continuing to await  placement, and pt continues require hospitalization for her safety while this is done as her mental status places her at a risk of danger to herself since she lacks capacity to take care of herself independently in the community. CSW is following up on the paperwork that is being required to enhance pt's placement into a LT residential living environment. (Please see today's note from CSW).  Pt denies all medication related side effects, and there are no changes or adjustments in medications warranted at this time. Will continue all as listed below.  Labs reviewed: TSH 4.673. FT3 & FT4 ordered yesterday due to elevated TSH. FT4 is subtherapeutic at 0.56, and FT3 is pending. Repeat UA ordered yesterday since last one showed a UTI. Repeat UA with large leukocytes, positive for nitrites. Will treat with Bactrim  for UTI. Will repeat EKG on 01/05.  Principal Problem: Schizoaffective disorder, depressive type (HCC)  Diagnosis: Principal Problem:   Schizoaffective disorder, depressive type (HCC) Active Problems:   GERD (gastroesophageal reflux disease)   Intellectual disability   Tobacco use disorder  Total Time spent with patient:  35 minutes  Past Psychiatric History: See H&P.  Past Medical History:  Past Medical History:  Diagnosis Date   Bipolar affect, depressed (HCC)    Constipation 08/17/2022   Depression    Falls 07/21/2022   Fracture of femoral neck, right, closed (HCC) 01/17/2022   Herpes simplex 08/22/2017   Open fracture dislocation of right elbow joint 01/17/2022    Past Surgical History:  Procedure Laterality Date   NO PAST SURGERIES     SALPINGECTOMY     Family History: History reviewed. No pertinent family history.  Family Psychiatric  History: See H&P.  Social History:  Social History   Substance and Sexual Activity  Alcohol Use Yes     Social History   Substance and Sexual Activity  Drug Use Yes   Types: Cocaine, Marijuana    Social History    Socioeconomic History   Marital status: Single    Spouse name: Not on file   Number of children: Not on file   Years of education: Not on file   Highest education level: Not on file  Occupational History   Not on file  Tobacco Use   Smoking status: Every Day   Smokeless tobacco: Not on file  Substance and Sexual Activity   Alcohol use: Yes   Drug use: Yes    Types: Cocaine, Marijuana   Sexual activity: Yes  Other Topics Concern   Not on file  Social History Narrative   Not on file   Social Drivers of Health   Financial Resource Strain: Low Risk  (09/12/2022)   Received from [facility], [facility]   Overall Financial Resource Strain (CARDIA)    Difficulty of Paying Living Expenses: Not hard at all  Food Insecurity: Patient Declined (12/20/2022)   Hunger Vital Sign    Worried About Running Out of Food in the Last Year: Patient declined    Ran Out of Food in the Last Year: Patient declined  Transportation Needs: No Transportation Needs (12/20/2022)   PRAPARE - Administrator, Civil Service (Medical): No    Lack of Transportation (Non-Medical): No  Physical Activity: Not on file  Stress: No Stress Concern Present (07/17/2022)   Received from [facility], [facility] - Occupational Stress Questionnaire    Feeling of Stress : Not at all  Social Connections: Unknown (07/16/2022)   Received from [facility], [facility]   Social Network    Social Network: Not on file   Additional Social History:   Sleep: Good  Appetite:  Good  Current Medications: Current Facility-Administered Medications  Medication Dose Route Frequency Provider Last Rate Last Admin   acetaminophen  (TYLENOL ) tablet 650 mg  650 mg Oral Q6H PRN Trudy Carwin, NP   650 mg at 07/05/23 2054   alum & mag hydroxide-simeth (MAALOX/MYLANTA) 200-200-20 MG/5ML suspension 30 mL  30 mL Oral Q4H PRN Onuoha, Chinwendu V, NP   30 mL at 05/31/23 0801    atorvastatin  (LIPITOR) tablet 10 mg  10 mg Oral Daily Jenny Omdahl I, NP   10 mg at 07/09/23 1727   busPIRone  (BUSPAR ) tablet 15 mg  15 mg Oral BID Nautika Cressey I, NP   15 mg at 07/10/23 0915   cyanocobalamin  (VITAMIN B12) injection 1,000 mcg  1,000 mcg Intramuscular Q30 days Evelena Figures, MD   1,000 mcg at 07/03/23 1009   diphenhydrAMINE  (BENADRYL ) capsule 50 mg  50 mg Oral TID PRN Trudy Carwin, NP   50 mg at 06/03/23 1110   Or   diphenhydrAMINE  (BENADRYL ) injection 50 mg  50 mg Intramuscular TID PRN Trudy Carwin, NP       docusate sodium  (COLACE) capsule 100 mg  100 mg Oral Daily Nkwenti, Doris, NP   100 mg at 07/10/23 0915   haloperidol  (HALDOL ) tablet 5 mg  5 mg Oral TID PRN Collene Gouge I, NP   5 mg at 06/03/23 1110   Or   haloperidol  lactate (HALDOL ) injection 5 mg  5 mg Intramuscular TID PRN Collene Gouge FERNS, NP  hydrocortisone  cream 1 %   Topical BID Ntuen, Tina C, FNP   Given at 07/02/23 1733   hydrOXYzine  (ATARAX ) tablet 25 mg  25 mg Oral TID PRN Nguyen, Julie, DO   25 mg at 07/07/23 9771   LORazepam  (ATIVAN ) tablet 2 mg  2 mg Oral TID PRN Trudy Carwin, NP   2 mg at 06/03/23 1110   Or   LORazepam  (ATIVAN ) injection 2 mg  2 mg Intramuscular TID PRN Trudy Carwin, NP       magnesium  hydroxide (MILK OF MAGNESIA) suspension 30 mL  30 mL Oral Daily PRN Trudy Carwin, NP   30 mL at 06/23/23 1856   melatonin tablet 5 mg  5 mg Oral QHS Tex Drilling, NP   5 mg at 07/09/23 2045   nicotine  polacrilex (NICORETTE ) gum 2 mg  2 mg Oral PRN Goli, Veeraindar, MD   2 mg at 07/09/23 1129   paliperidone  (INVEGA ) 24 hr tablet 6 mg  6 mg Oral Daily Tex Drilling, NP   6 mg at 07/09/23 2045   pantoprazole  (PROTONIX ) EC tablet 40 mg  40 mg Oral Daily Collene Gouge I, NP   40 mg at 07/10/23 0915   polyethylene glycol (MIRALAX  / GLYCOLAX ) packet 17 g  17 g Oral Daily PRN Goli, Veeraindar, MD   17 g at 06/07/23 1847   polyethylene glycol (MIRALAX  / GLYCOLAX ) packet 17 g  17 g Oral Daily Goli,  Veeraindar, MD   17 g at 06/30/23 0815   sertraline  (ZOLOFT ) tablet 150 mg  150 mg Oral Daily Massengill, Rankin, MD   150 mg at 07/10/23 0915   sulfamethoxazole -trimethoprim  (BACTRIM  DS) 800-160 MG per tablet 1 tablet  1 tablet Oral Q12H Tex Drilling, NP   1 tablet at 07/10/23 0915   SUMAtriptan  (IMITREX ) tablet 25 mg  25 mg Oral BID PRN Tex Drilling, NP   25 mg at 07/05/23 1908   traZODone  (DESYREL ) tablet 50 mg  50 mg Oral QHS Tex Drilling, NP   50 mg at 07/09/23 2045   Vitamin D  (Ergocalciferol ) (DRISDOL ) 1.25 MG (50000 UNIT) capsule 50,000 Units  50,000 Units Oral Q7 days Tex Drilling, NP   50,000 Units at 07/06/23 1445   Lab Results:  Results for orders placed or performed during the hospital encounter of 12/20/22 (from the past 48 hours)  Urinalysis, Routine w reflex microscopic -Urine, Clean Catch     Status: Abnormal   Collection Time: 07/08/23  4:14 PM  Result Value Ref Range   Color, Urine YELLOW YELLOW   APPearance HAZY (A) CLEAR   Specific Gravity, Urine 1.017 1.005 - 1.030   pH 6.0 5.0 - 8.0   Glucose, UA NEGATIVE NEGATIVE mg/dL   Hgb urine dipstick SMALL (A) NEGATIVE   Bilirubin Urine NEGATIVE NEGATIVE   Ketones, ur NEGATIVE NEGATIVE mg/dL   Protein, ur NEGATIVE NEGATIVE mg/dL   Nitrite POSITIVE (A) NEGATIVE   Leukocytes,Ua LARGE (A) NEGATIVE   RBC / HPF 6-10 0 - 5 RBC/hpf   WBC, UA >50 0 - 5 WBC/hpf   Bacteria, UA NONE SEEN NONE SEEN   Squamous Epithelial / HPF 6-10 0 - 5 /HPF   Mucus PRESENT    Non Squamous Epithelial 0-5 (A) NONE SEEN    Comment: Performed at [facility], [address], [place], [place] [address]  T3, free     Status: Abnormal   Collection Time: 07/08/23  6:51 PM  Result Value Ref Range   T3, Free 1.9 (  L) 2.0 - 4.4 pg/mL    Comment: (NOTE) Performed At: [facility] [address] [place], [place] [id number] Jennette Shorter MD Ey:[id number]   T4, free     Status: Abnormal   Collection Time: 07/08/23  6:51  PM  Result Value Ref Range   Free T4 0.56 (L) 0.61 - 1.12 ng/dL    Comment: (NOTE) Biotin ingestion may interfere with free T4 tests. If the results are inconsistent with the TSH level, previous test results, or the clinical presentation, then consider biotin interference. If needed, order repeat testing after stopping biotin. Performed at [facility], [address], [place], [place] [address]     Blood Alcohol level:  Lab Results  Component Value Date   [facility] <10 12/19/2022   ETH <10 11/21/2019   Metabolic Disorder Labs: Lab Results  Component Value Date   HGBA1C 5.6 06/16/2023   MPG 114.02 06/16/2023   MPG 79.58 12/21/2022   No results found for: PROLACTIN Lab Results  Component Value Date   CHOL 256 (H) 06/16/2023   TRIG 224 (H) 06/16/2023   HDL 83 06/16/2023   CHOLHDL 3.1 06/16/2023   VLDL 45 (H) 06/16/2023   LDLCALC 128 (H) 06/16/2023   LDLCALC 149 (H) 12/21/2022   Physical Findings: AIMS: Facial and Oral Movements Muscles of Facial Expression: None Lips and Perioral Area: None Jaw: None Tongue: None,Extremity Movements Upper (arms, wrists, hands, fingers): None Lower (legs, knees, ankles, toes): None, Trunk Movements Neck, shoulders, hips: None, Global Judgements Severity of abnormal movements overall : None Incapacitation due to abnormal movements: None Patient's awareness of abnormal movements: No Awareness, Dental Status Current problems with teeth and/or dentures?: No Does patient usually wear dentures?: No  CIWA:    COWS:     Musculoskeletal: Strength & Muscle Tone: within normal limits Gait & Station: normal Patient leans:  Walks with a slight  limp like one leg is longer than the other.  Psychiatric Specialty Exam:  Presentation  General Appearance:  Fairly Groomed  Eye Contact: Minimal  Speech: Garbled  Speech Volume: Decreased  Handedness: Right   Mood and Affect  Mood: Depressed  Affect: Congruent   Thought  Process  Thought Processes: Coherent  Descriptions of Associations:Intact  Orientation:Partial  Thought Content:Logical  History of Schizophrenia/Schizoaffective disorder:No  Duration of Psychotic Symptoms:N/A  Hallucinations:Hallucinations: None   Ideas of Reference:None  Suicidal Thoughts:Suicidal Thoughts: No   Homicidal Thoughts:Homicidal Thoughts: No    Sensorium  Memory: Immediate Fair  Judgment: Poor  Insight: Poor   Executive Functions  Concentration: Fair  Attention Span: Poor  Recall: Poor  Fund of Knowledge: Poor  Language: Fair  Psychomotor Activity  Psychomotor Activity: Psychomotor Activity: Normal   Assets  Assets: Resilience  Sleep  Sleep: Sleep: Good   Physical Exam: Physical Exam Vitals and nursing note reviewed.  HENT:     Mouth/Throat:     Pharynx: Oropharynx is clear.  Cardiovascular:     Rate and Rhythm: Normal rate.     Pulses: Normal pulses.  Pulmonary:     Effort: Pulmonary effort is normal.  Genitourinary:    Comments: Deferred Musculoskeletal:        General: Normal range of motion.     Cervical back: Normal range of motion.  Skin:    General: Skin is warm and dry.  Neurological:     General: No focal deficit present.     Mental Status: She is alert and oriented to person, place, and time.  Review of Systems  Constitutional:  Negative for chills, diaphoresis and fever.  HENT:  Negative for congestion and sore throat.   Respiratory:  Negative for cough, shortness of breath and wheezing.   Cardiovascular:  Negative for chest pain and palpitations.  Gastrointestinal:  Negative for abdominal pain, constipation, diarrhea, heartburn, nausea and vomiting.  Musculoskeletal:  Negative for joint pain and myalgias.  Skin:  Negative for itching and rash.  Neurological:  Negative for dizziness, tingling, tremors, sensory change, speech change, focal weakness, seizures, loss of consciousness, weakness and  headaches.  Endo/Heme/Allergies:        NKDA  Psychiatric/Behavioral:  Positive for depression. Negative for hallucinations, memory loss, substance abuse and suicidal ideas. The patient has insomnia. The patient is not nervous/anxious.    Blood pressure 115/65, pulse (!) 103, temperature 97.7 F (36.5 C), temperature source Oral, resp. rate 16, height 4' 11 (1.499 m), weight 63 kg, SpO2 100%. Body mass index is 28.05 kg/m.  Treatment Plan Summary: Daily contact with patient to assess and evaluate symptoms and progress in treatment and Medication management.  Continue inpatient hospitalization.  Will continue today 07/10/2023 plan as below except where it is noted.   Patient maintained her baseline. She is tolerating her medications well. There is no dangerousness being displayed on the uint. She is stable for care as soon as we finalize safe and appropriate disposition. We will continue her medications and evaluate her further.   Principal/active diagnoses:  Schizoaffective disorder, depressive type GERD (gastroesophageal reflux disease) Intellectual disability Tobacco use disorder  Plan: The risks/benefits/side-effects/alternatives to the medications in use were discussed in detail with the patient and time was given for patient's questions. The patient consents to medication trial.   1.  Continue Invega  ER 6 mg at bedtime for mood control. 2.  Continue sertraline  150 mg daily for depression.. 3.  Continue trazodone  50 mg as needed for insomnia.  4.  Continue Buspar  15 mg po bid for anxiety.  5.  Continue Melatonin 5 mg po Q hs for insomnia. 6.  Continue to monitor mood behavior and interaction with others. 7.  Continue to encourage unit groups and therapeutic activities.  - Continue Bactrim -DS 1 tab BID x 7 days for UTI  Other medical issues:  -Continue  Lipitor 10 mg po q evenings for hyperlipidemia.  -Continue Colace 100 mg po daily for constipation.  -Continue Vit. B-12 po  daily for B-12 deficiency.  -Continue Miralax  17 gm po Q daily/prn for constipation.  -Continue Protonix  40 mg po Q am for acid reflux.  -Continue Vit. D 50,000 units pr daily for Vit. D deficiency.  -Continue Imitrex  25 mg po bid prn for migraine headache.   Continue the agitation protocols as recommended.  Other PRNS -Continue Tylenol  650 mg every 6 hours PRN for mild pain -Continue Maalox 30 ml Q 4 hrs PRN for indigestion -Continue MOM 30 ml po Q 6 hrs for constipation  Safety and Monitoring: Voluntary admission to inpatient psychiatric unit for safety, stabilization and treatment Daily contact with patient to assess and evaluate symptoms and progress in treatment Patient's case to be discussed in multi-disciplinary team meeting Observation Level : q15 minute checks Vital signs: q12 hours Precautions: Safety  Discharge Planning: Social work and case management to assist with discharge planning and identification of hospital follow-up needs prior to discharge Estimated LOS: 5-7 days Discharge Concerns: Need to establish a safety plan; Medication compliance and effectiveness Discharge Goals: Return home with outpatient referrals for mental health  follow-up including medication management/psychotherapy  Mac Bolster, NP, pmhnp 07/10/2023, 4:05 PM Patient ID: [name], female   DOB: Sep 04, 1975, 48 y.o.   MRN: [id number]  Patient ID: Y[facility]anda [name], female   DOB: 06-15-1976, 48 y.o.   MRN: [id number]

## 2023-07-11 DIAGNOSIS — F251 Schizoaffective disorder, depressive type: Secondary | ICD-10-CM | POA: Diagnosis not present

## 2023-07-11 MED ORDER — PERMETHRIN 1 % EX LIQD
Freq: Once | CUTANEOUS | Status: AC | PRN
  Filled 2023-07-11: qty 59

## 2023-07-11 NOTE — Plan of Care (Signed)
  Problem: Education: Goal: Knowledge of [facility] Education information/materials will improve Outcome: Progressing   Problem: Education: Goal: Ability to make informed decisions regarding treatment will improve Outcome: Progressing   Problem: Coping: Goal: Coping ability will improve Outcome: Progressing   Problem: Medication: Goal: Compliance with prescribed medication regimen will improve Outcome: Progressing

## 2023-07-11 NOTE — Progress Notes (Signed)
 Patient is alert, oriented, pleasant, and cooperative. Denies SI, HI, AVH, and verbally contracts for safety. Patient denies physical symptoms/pain.   Contact precautions maintained for entire shift with expecting them to end tonight.  Scheduled medications administered per MD order. Support provided. Patient educated on safety on the unit and medications. Routine safety checks every 15 minutes. Patient stated understanding to tell nurse about any new physical symptoms. Patient understands to tell staff of any needs.     No adverse drug reactions noted. Patient remains safe at this time and will continue to monitor.    07/11/23 0900  Psych Admission Type (Psych Patients Only)  Admission Status Involuntary  Psychosocial Assessment  Patient Complaints Anxiety;Depression  Eye Contact Fair  Facial Expression Flat  Affect Anxious  Speech Logical/coherent  Interaction Assertive  Motor Activity Tremors  Appearance/Hygiene Unremarkable;In hospital gown  Behavior Characteristics Cooperative;Appropriate to situation  Mood Depressed;Anxious  Thought Process  Coherency WDL  Content WDL  Delusions None reported or observed  Perception WDL  Hallucination None reported or observed  Judgment Poor  Confusion None  Danger to Self  Current suicidal ideation? Denies  Self-Injurious Behavior No self-injurious ideation or behavior indicators observed or expressed   Agreement Not to Harm Self Yes  Description of Agreement verbal  Danger to Others  Danger to Others None reported or observed

## 2023-07-11 NOTE — Progress Notes (Signed)
 Pt reported having head lice, provider on call notified and ordered permethrin (NIX), which by help of staff, pt washed her as instructed. Pt tolerated well, will continue to monitor.

## 2023-07-11 NOTE — Plan of Care (Signed)
  Problem: Education: Goal: Knowledge of [facility] Education information/materials will improve Outcome: Progressing Goal: Emotional status will improve Outcome: Progressing Goal: Mental status will improve Outcome: Progressing Goal: Verbalization of understanding the information provided will improve Outcome: Progressing   Problem: Coping: Goal: Ability to demonstrate self-control will improve Outcome: Progressing   Problem: Safety: Goal: Periods of time without injury will increase Outcome: Progressing   Problem: Coping: Goal: Coping ability will improve Outcome: Progressing   Problem: Medication: Goal: Compliance with prescribed medication regimen will improve Outcome: Progressing   Problem: Self-Concept: Goal: Ability to disclose and discuss suicidal ideas will improve Outcome: Progressing

## 2023-07-11 NOTE — Discharge Planning (Addendum)
 Meeting held on today with [facility] Supervisor Massie Silvius, [facility] Director Arthea Sprang, Behavioral Health MD [place], DSS Adult Protective Services Supervisor Melakine Piedmont, Care Manager [place] with Jarrell Donia Paterson, LCSW at [facility], and DSS Representative Arch Ada regarding patient disposition.  Mrs. Silvius informed the team of the need for DSS to establish a safe plan of discharge for the patient as she is decompensating in the hospital. DSS expressed understanding, however informed the team that they do not have anywhere to place the patient at this time. The option of a hotel room and possibly 24 hour staffing being provided was mentioned, and the patient returning home with her family with in-home services. However, additional funding would have to be approved for placement such as a hotel which DSS Supervisor is willing to explore. DSS Representative Arch Ada mentioned that she has explored the option of the patient returning home with her mother temporarily with services in place, however mother declined stating she did not want anyone within her home. This same information was explore on 11/29 via conversation between this Clinician and mother. DSS plans to explore option of patient staying with one of the daughters until placement has been secured. Additional alternatives provided to DSS by [facility] Director Cleatrice Sprang, and updates will be provided as received.   Team is aware that new group home [facility] in [place] is willing to consider the patient for placement. Patient clinicals were sent over this morning to the agency for review. Per [facility] Supervisor Adrien Essex [phone], she believes the patient would meet criteria for her home. Conversation held between Care Manager Marthe Pouch, this Clinician, and [facility] Owner Adrien Essex regarding next steps. Paperwork was sent to Mrs. Debra in order for her to complete an Enhanced Rate and  Budget Sheet for the patient (which will be the case for all group home placements per [facility]). [name] reports being familiar with the process and states she will begin to work on it today. Per Jolan, process could take between 3-5 days. Updates will be provided as received.   Previous Group Home Representative Tereso Moats provided the lead on current group home consideration. Mrs. Tereso expressed being grateful for the referral for Mrs. Munachimso and reports being hopeful for placement for the patient. Brief support was provided to Mrs. Lucinda regarding her consideration and assistance with case. No other needs to report at this time.   Plan is to establish weekly meetings with team in order to stay current with updates and process. LCSW will continue to follow and provide support as able.    Merlynn Lazier, LCSW Clinical Social Worker [facility] Ph: [phone]

## 2023-07-11 NOTE — Group Note (Signed)
 LCSW Group Therapy Note   Group Date: 07/11/2023 Start Time: 1100 End Time: 1200  Type of Therapy and Topic:  Group Therapy - Who Am I?  Participation Level:  DID NOT ATTEND  Description of Group The focus of this group was to aid patients in self-exploration and awareness. Patients were guided in exploring various factors of oneself to include interests, readiness to change, management of emotions, and individual perception of self. Patients were provided with complementary worksheets exploring hidden talents, ease of asking other for help, music/media preferences, understanding and responding to feelings/emotions, and hope for the future. At group closing, patients were encouraged to adhere to discharge plan to assist in continued self-exploration and understanding.  Therapeutic Goals Patients learned that self-exploration and awareness is an ongoing process Patients identified their individual skills, preferences, and abilities Patients explored their openness to establish and confide in supports Patients explored their readiness for change and progression of mental health    Therapeutic Modalities Cognitive Behavioral Therapy Motivational Interviewing  Yolanda [name] Yolanda [name] 07/12/2023  7:14 PM

## 2023-07-11 NOTE — Progress Notes (Signed)
   07/10/23 2300  Psych Admission Type (Psych Patients Only)  Admission Status Involuntary  Psychosocial Assessment  Patient Complaints Anxiety;Depression  Eye Contact Fair  Facial Expression Animated  Affect Anxious;Sad  Speech Logical/coherent  Interaction Assertive  Motor Activity Tremors  Appearance/Hygiene Unremarkable  Behavior Characteristics Cooperative;Appropriate to situation  Mood Pleasant  Thought Process  Coherency WDL  Content WDL  Delusions None reported or observed  Perception WDL  Hallucination None reported or observed  Judgment Poor  Confusion None  Danger to Self  Current suicidal ideation? Denies  Self-Injurious Behavior No self-injurious ideation or behavior indicators observed or expressed   Agreement Not to Harm Self Yes  Description of Agreement verbal  Danger to Others  Danger to Others None reported or observed

## 2023-07-11 NOTE — Progress Notes (Signed)
 [facility] MD Progress Note  07/11/2023 12:52 PM Lavayah PEYTEN PUNCHES  MRN:  [id number]  Reason for admission:  History of Present Illness: This is the first psychiatric admission in this [facility] in 49 years for this AA female with an extensive hx of mental illnesses & probable polysubstance use disorders. She is admitted to the [facility] from the [facility] hospital with complain of worsening suicidal ideations with plan to stab herself. Per chart review, patient apparently reported at the ED that she has been depressed for a while & has not been taking her mental health medications. After medical evaluation.clearance, she was transferred to the [facility] for further psychiatric evaluation/treatments.   24 hr chart review: Vital signs remained stable.  Patient is continuing to be compliant with scheduled medications.  Slept for 8 hrs as per nursing reports. Denies any burning with urination.  Daily notes: Rose-Marie is seen in her room. She was lying down in bed, but awake, alert & oriented. She is aware of situation. She reports, My mood is okay. I got lice in my hair. I realized yesterday that something was not right with my hair because it kept itching me. So, it felt like something was moving on my scalp. I told the nurse. They checked & found some lice. They gave me treatment for it last night. I have to stay in my room for 24 hours. All my clothes has been washed. Allyah currently denies any side effects from her medications. She continues the bactrim  DS for uti. She denies any burning with urination. She remains afebrile. Fluids is encouraged. She currently denies any SIHI, AVH, delusional thoughts or paranoia. She does not appear to be responding to any internal stimuli. The medical director states that it will be necessary to move Kamden into another room after her lice infestation treatment so that her current room will be thoroughly cleaned & disinfected as patient has lived in this current room for a long time.  There are no changes made on the current plan of care. Staff continues to support & provide encouragement for this patient. We are still waiting for some news about patient's group home placement. Continue current plan of care as already in progress. Vital signs stable.   Labs reviewed: TSH 4.673. FT3 & FT4 ordered yesterday due to elevated TSH. FT4 is subtherapeutic at 0.56, and FT3 is pending. Repeat UA ordered yesterday since last one showed a UTI. Repeat UA with large leukocytes, positive for nitrites. Will treat with Bactrim  for UTI. Will repeat EKG on 01/05.  Principal Problem: Schizoaffective disorder, depressive type (HCC)  Diagnosis: Principal Problem:   Schizoaffective disorder, depressive type (HCC) Active Problems:   GERD (gastroesophageal reflux disease)   Intellectual disability   Tobacco use disorder  Total Time spent with patient:  35 minutes  Past Psychiatric History: See H&P.  Past Medical History:  Past Medical History:  Diagnosis Date   Bipolar affect, depressed (HCC)    Constipation 08/17/2022   Depression    Falls 07/21/2022   Fracture of femoral neck, right, closed (HCC) 01/17/2022   Herpes simplex 08/22/2017   Open fracture dislocation of right elbow joint 01/17/2022    Past Surgical History:  Procedure Laterality Date   NO PAST SURGERIES     SALPINGECTOMY     Family History: History reviewed. No pertinent family history.  Family Psychiatric  History: See H&P.  Social History:  Social History   Substance and Sexual Activity  Alcohol Use Yes  Social History   Substance and Sexual Activity  Drug Use Yes   Types: Cocaine, Marijuana    Social History   Socioeconomic History   Marital status: Single    Spouse name: Not on file   Number of children: Not on file   Years of education: Not on file   Highest education level: Not on file  Occupational History   Not on file  Tobacco Use   Smoking status: Every Day   Smokeless tobacco: Not  on file  Substance and Sexual Activity   Alcohol use: Yes   Drug use: Yes    Types: Cocaine, Marijuana   Sexual activity: Yes  Other Topics Concern   Not on file  Social History Narrative   Not on file   Social Drivers of Health   Financial Resource Strain: Low Risk  (09/12/2022)   Received from [facility], [facility]   Overall Financial Resource Strain (CARDIA)    Difficulty of Paying Living Expenses: Not hard at all  Food Insecurity: Patient Declined (12/20/2022)   Hunger Vital Sign    Worried About Running Out of Food in the Last Year: Patient declined    Ran Out of Food in the Last Year: Patient declined  Transportation Needs: No Transportation Needs (12/20/2022)   PRAPARE - Administrator, Civil Service (Medical): No    Lack of Transportation (Non-Medical): No  Physical Activity: Not on file  Stress: No Stress Concern Present (07/17/2022)   Received from [facility], [facility] - Occupational Stress Questionnaire    Feeling of Stress : Not at all  Social Connections: Unknown (07/16/2022)   Received from [facility], [facility]   Social Network    Social Network: Not on file   Additional Social History:   Sleep: Good  Appetite:  Good  Current Medications: Current Facility-Administered Medications  Medication Dose Route Frequency Provider Last Rate Last Admin   acetaminophen  (TYLENOL ) tablet 650 mg  650 mg Oral Q6H PRN Trudy Carwin, NP   650 mg at 07/05/23 2054   alum & mag hydroxide-simeth (MAALOX/MYLANTA) 200-200-20 MG/5ML suspension 30 mL  30 mL Oral Q4H PRN Onuoha, Chinwendu V, NP   30 mL at 05/31/23 0801   atorvastatin  (LIPITOR) tablet 10 mg  10 mg Oral Daily Kaliel Bolds I, NP   10 mg at 07/10/23 1818   busPIRone  (BUSPAR ) tablet 15 mg  15 mg Oral BID Ashlen Kiger I, NP   15 mg at 07/11/23 0754   cyanocobalamin  (VITAMIN B12) injection 1,000 mcg  1,000 mcg Intramuscular Q30 days Evelena Figures, MD    1,000 mcg at 07/03/23 1009   diphenhydrAMINE  (BENADRYL ) capsule 50 mg  50 mg Oral TID PRN Trudy Carwin, NP   50 mg at 06/03/23 1110   Or   diphenhydrAMINE  (BENADRYL ) injection 50 mg  50 mg Intramuscular TID PRN Trudy Carwin, NP       docusate sodium  (COLACE) capsule 100 mg  100 mg Oral Daily Nkwenti, Doris, NP   100 mg at 07/11/23 0754   haloperidol  (HALDOL ) tablet 5 mg  5 mg Oral TID PRN Collene Gouge I, NP   5 mg at 06/03/23 1110   Or   haloperidol  lactate (HALDOL ) injection 5 mg  5 mg Intramuscular TID PRN Collene Gouge I, NP       hydrocortisone  cream 1 %   Topical BID Ntuen, Tina C, FNP   Given at 07/02/23 1733  hydrOXYzine  (ATARAX ) tablet 25 mg  25 mg Oral TID PRN Nguyen, Julie, DO   25 mg at 07/07/23 9771   LORazepam  (ATIVAN ) tablet 2 mg  2 mg Oral TID PRN Trudy Carwin, NP   2 mg at 06/03/23 1110   Or   LORazepam  (ATIVAN ) injection 2 mg  2 mg Intramuscular TID PRN Trudy Carwin, NP       magnesium  hydroxide (MILK OF MAGNESIA) suspension 30 mL  30 mL Oral Daily PRN Trudy Carwin, NP   30 mL at 06/23/23 1856   melatonin tablet 5 mg  5 mg Oral QHS Nkwenti, Doris, NP   5 mg at 07/10/23 2118   nicotine  polacrilex (NICORETTE ) gum 2 mg  2 mg Oral PRN Goli, Veeraindar, MD   2 mg at 07/09/23 1129   paliperidone  (INVEGA ) 24 hr tablet 6 mg  6 mg Oral Daily Nkwenti, Doris, NP   6 mg at 07/10/23 2118   pantoprazole  (PROTONIX ) EC tablet 40 mg  40 mg Oral Daily Saige Canton I, NP   40 mg at 07/11/23 0754   polyethylene glycol (MIRALAX  / GLYCOLAX ) packet 17 g  17 g Oral Daily PRN Goli, Veeraindar, MD   17 g at 06/07/23 1847   sertraline  (ZOLOFT ) tablet 150 mg  150 mg Oral Daily [place], Rankin, MD   150 mg at 07/11/23 0754   sulfamethoxazole -trimethoprim  (BACTRIM  DS) 800-160 MG per tablet 1 tablet  1 tablet Oral Q12H Tex Drilling, NP   1 tablet at 07/11/23 9245   SUMAtriptan  (IMITREX ) tablet 25 mg  25 mg Oral BID PRN Tex Drilling, NP   25 mg at 07/10/23 1642   traZODone  (DESYREL ) tablet 50 mg   50 mg Oral QHS Tex Drilling, NP   50 mg at 07/10/23 2118   Vitamin D  (Ergocalciferol ) (DRISDOL ) 1.25 MG (50000 UNIT) capsule 50,000 Units  50,000 Units Oral Q7 days Tex Drilling, NP   50,000 Units at 07/06/23 1445   Lab Results:  No results found for this or any previous visit (from the past 48 hours).   Blood Alcohol level:  Lab Results  Component Value Date   ETH <10 12/19/2022   ETH <10 11/21/2019   Metabolic Disorder Labs: Lab Results  Component Value Date   HGBA1C 5.6 06/16/2023   MPG 114.02 06/16/2023   MPG 79.58 12/21/2022   No results found for: PROLACTIN Lab Results  Component Value Date   CHOL 256 (H) 06/16/2023   TRIG 224 (H) 06/16/2023   HDL 83 06/16/2023   CHOLHDL 3.1 06/16/2023   VLDL 45 (H) 06/16/2023   LDLCALC 128 (H) 06/16/2023   LDLCALC 149 (H) 12/21/2022   Physical Findings: AIMS: Facial and Oral Movements Muscles of Facial Expression: None Lips and Perioral Area: None Jaw: None Tongue: None,Extremity Movements Upper (arms, wrists, hands, fingers): None Lower (legs, knees, ankles, toes): None, Trunk Movements Neck, shoulders, hips: None, Global Judgements Severity of abnormal movements overall : None Incapacitation due to abnormal movements: None Patient's awareness of abnormal movements: No Awareness, Dental Status Current problems with teeth and/or dentures?: No Does patient usually wear dentures?: No  CIWA:    COWS:     Musculoskeletal: Strength & Muscle Tone: within normal limits Gait & Station: normal Patient leans:  Walks with a slight  limp like one leg is longer than the other.  Psychiatric Specialty Exam:  Presentation  General Appearance:  Casual; Fairly Groomed; Appropriate for Environment  Eye Contact: Good  Speech: Slow (speech may be garbled at  times.)  Speech Volume: Decreased  Handedness: Right   Mood and Affect  Mood: -- (Hopeful)  Affect: Congruent   Thought Process  Thought  Processes: Coherent; Linear; Goal Directed  Descriptions of Associations:Intact  Orientation:Full (Time, Place and Person)  Thought Content:Logical  History of Schizophrenia/Schizoaffective disorder:Yes  Duration of Psychotic Symptoms:Greater than six months  Hallucinations:Hallucinations: None Description of Command Hallucinations: NA Description of Auditory Hallucinations: NA    Ideas of Reference:None  Suicidal Thoughts:Suicidal Thoughts: No    Homicidal Thoughts:Homicidal Thoughts: No     Sensorium  Memory: Immediate Fair; Recent Fair; Remote Good  Judgment: Fair  Insight: Good   Executive Functions  Concentration: Fair  Attention Span: Fair  Recall: Good  Fund of Knowledge: Fair  Language: Fair  Psychomotor Activity  Psychomotor Activity: Psychomotor Activity: Normal    Assets  Assets: Communication Skills; Desire for Improvement; Financial Resources/Insurance; Resilience  Sleep  Sleep: Sleep: Good Number of Hours of Sleep: 8    Physical Exam: Physical Exam Vitals and nursing note reviewed.  HENT:     Mouth/Throat:     Pharynx: Oropharynx is clear.  Cardiovascular:     Rate and Rhythm: Normal rate.     Pulses: Normal pulses.  Pulmonary:     Effort: Pulmonary effort is normal.  Genitourinary:    Comments: Deferred Musculoskeletal:        General: Normal range of motion.     Cervical back: Normal range of motion.  Skin:    General: Skin is warm and dry.  Neurological:     General: No focal deficit present.     Mental Status: She is alert and oriented to person, place, and time.    Review of Systems  Constitutional:  Negative for chills, diaphoresis and fever.  HENT:  Negative for congestion and sore throat.   Respiratory:  Negative for cough, shortness of breath and wheezing.   Cardiovascular:  Negative for chest pain and palpitations.  Gastrointestinal:  Negative for abdominal pain, constipation, diarrhea,  heartburn, nausea and vomiting.  Musculoskeletal:  Negative for joint pain and myalgias.  Skin:  Negative for itching and rash.  Neurological:  Negative for dizziness, tingling, tremors, sensory change, speech change, focal weakness, seizures, loss of consciousness, weakness and headaches.  Endo/Heme/Allergies:        NKDA  Psychiatric/Behavioral:  Positive for depression. Negative for hallucinations, memory loss, substance abuse and suicidal ideas. The patient has insomnia. The patient is not nervous/anxious.    Blood pressure 98/64, pulse 61, temperature 97.8 F (36.6 C), temperature source Oral, resp. rate 18, height 4' 11 (1.499 m), weight 63 kg, SpO2 100%. Body mass index is 28.05 kg/m.  Treatment Plan Summary: Daily contact with patient to assess and evaluate symptoms and progress in treatment and Medication management.  Continue inpatient hospitalization.  Will continue today 07/11/2023 plan as below except where it is noted.   Patient maintained her baseline. She is tolerating her medications well. There is no dangerousness being displayed on the uint. She is stable for care as soon as we finalize safe and appropriate disposition. We will continue her medications and evaluate her further.   Principal/active diagnoses:  Schizoaffective disorder, depressive type GERD (gastroesophageal reflux disease) Intellectual disability Tobacco use disorder  Plan: The risks/benefits/side-effects/alternatives to the medications in use were discussed in detail with the patient and time was given for patient's questions. The patient consents to medication trial.   1.  Continue Invega  ER 6 mg at bedtime for mood control. 2.  Continue sertraline  150 mg daily for depression.. 3.  Continue trazodone  50 mg as needed for insomnia.  4.  Continue Buspar  15 mg po bid for anxiety.  5.  Continue Melatonin 5 mg po Q hs for insomnia. 6.  Continue to monitor mood behavior and interaction with others. 7.   Continue to encourage unit groups and therapeutic activities.  - Continue Bactrim -DS 1 tab BID x 7 days for UTI  Other medical issues:  -Continue  Lipitor 10 mg po q evenings for hyperlipidemia.  -Continue Colace 100 mg po daily for constipation.  -Continue Vit. B-12 po daily for B-12 deficiency.  -Continue Miralax  17 gm po Q daily/prn for constipation.  -Continue Protonix  40 mg po Q am for acid reflux.  -Continue Vit. D 50,000 units pr daily for Vit. D deficiency.  -Continue Imitrex  25 mg po bid prn for migraine headache.   Continue the agitation protocols as recommended.  Other PRNS -Continue Tylenol  650 mg every 6 hours PRN for mild pain -Continue Maalox 30 ml Q 4 hrs PRN for indigestion -Continue MOM 30 ml po Q 6 hrs for constipation  Safety and Monitoring: Voluntary admission to inpatient psychiatric unit for safety, stabilization and treatment Daily contact with patient to assess and evaluate symptoms and progress in treatment Patient's case to be discussed in multi-disciplinary team meeting Observation Level : q15 minute checks Vital signs: q12 hours Precautions: Safety  Discharge Planning: Social work and case management to assist with discharge planning and identification of hospital follow-up needs prior to discharge Estimated LOS: 5-7 days Discharge Concerns: Need to establish a safety plan; Medication compliance and effectiveness Discharge Goals: Return home with outpatient referrals for mental health follow-up including medication management/psychotherapy  Mac Bolster, NP, pmhnp 07/11/2023, 12:52 PM Patient ID: Mitch JONETTA Lance, female   DOB: [birth year], 48 y.o.   MRN: [id number]  Patient ID: LEEANDRA ELLERSON, female   DOB: December 31, 1975, 48 y.o.   MRN: [id number] Patient ID: CHARNICE ZWILLING, female   DOB: 19-Jun-1976, 48 y.o.   MRN: [id number]

## 2023-07-11 NOTE — Group Note (Signed)
 Date:  07/11/2023 Time:  9:25 PM  Group Topic/Focus:  Wrap-Up Group:   The focus of this group is to help patients review their daily goal of treatment and discuss progress on daily workbooks.    Participation Level:  Did Not Attend  Participation Quality:   N/A  Affect:   N/A  Cognitive:   N/A  Insight: None  Engagement in Group:   N/A  Modes of Intervention:   N/A  Additional Comments:  Patient could not attend wrap up group due to precautions.   Yolanda [name] 07/11/2023, 9:25 PM

## 2023-07-11 NOTE — [facility] Group Notes (Signed)
 [facility] Group Notes:  (Nursing/MHT/Case Management/Adjunct)  Date:  07/11/2023  Time:  9:30 AM  Type of Therapy:  Group Topic/ Focus: Goals Group: The focus of this group is to help patients establish daily goals to achieve during treatment and discuss how the patient can incorporate goal setting into their daily lives to aide in recovery.   Participation Level:  Did Not Attend  Summary of Progress/Problems:  Patient did not attend goals/ orientation group today. Patient was encouraged but refused.   Anarely Nicholls R Tyde Lamison 07/11/2023, 9:30 AM

## 2023-07-12 ENCOUNTER — Encounter (HOSPITAL_COMMUNITY): Payer: Self-pay

## 2023-07-12 DIAGNOSIS — F251 Schizoaffective disorder, depressive type: Secondary | ICD-10-CM | POA: Diagnosis not present

## 2023-07-12 NOTE — Progress Notes (Addendum)
 Pt denied SI/HI/AVH this morning. Pt has been pleasant, calm, and cooperative throughout the shift. Pt reports My lice are gone!. RN inspected patient's scalp, no lice observed. Contact precautions discontinued per provider. Pt complains of lower back pain and rates it a 5/10, PRN tylenol  administered per MAR. RN provided support and encouragement to patient. Pt given scheduled medications as prescribed. Q15 min checks verified for safety. Patient verbally contracts for safety. Patient compliant with medications and treatment plan. Pt is safe on the unit.   07/12/23 0834  Psych Admission Type (Psych Patients Only)  Admission Status Involuntary  Psychosocial Assessment  Patient Complaints Anxiety;Depression  Eye Contact Fair  Facial Expression Animated  Affect Anxious;Sad  Speech Logical/coherent  Interaction Assertive  Motor Activity Tremors  Appearance/Hygiene Designer, Industrial/product Cooperative;Anxious  Mood Depressed;Anxious  Thought Process  Coherency WDL  Content WDL  Delusions None reported or observed  Perception WDL  Hallucination None reported or observed  Judgment Impaired  Confusion None  Danger to Self  Current suicidal ideation? Denies  Description of Suicide Plan No plan  Self-Injurious Behavior No self-injurious ideation or behavior indicators observed or expressed   Agreement Not to Harm Self Yes  Description of Agreement Verbal  Danger to Others  Danger to Others None reported or observed  Danger to Others Abnormal  Harmful Behavior to others No threats or harm toward other people  Destructive Behavior No threats or harm toward property

## 2023-07-12 NOTE — Progress Notes (Signed)
 [facility] MD Progress Note  07/12/2023 12:32 PM Yolanda [name]  MRN:  [id number]  Reason for admission:  History of Present Illness: This is the first psychiatric admission in this [facility] in 49 years for this AA female with an extensive hx of mental illnesses & probable polysubstance use disorders. She is admitted to the [facility] from the [facility] hospital with complain of worsening suicidal ideations with plan to stab herself. Per chart review, patient apparently reported at the ED that she has been depressed for a while & has not been taking her mental health medications. After medical evaluation.clearance, she was transferred to the [facility] for further psychiatric evaluation/treatments.   Daily notes: Yolanda [name] is seen in her room. She is awake, alert, oriented & aware of situation. She is visible on the unit, usually during lunch times. However, in the last 24 hours, she was made to stay in her room after she was treated for head lice. She should be able to be out of her room today to attend group sessions, activities & go to the cafeteria for meals with the other patients. She remains without any changes in her mood. She is compliant with her treatment regimen. She is tolerating them without any side effects reported. She is currently receiving antibiotic therapy for uti. Denies any side effects. Denies any issues with urination. Encouraged to drink plenty of fluid. She continues to hope & anticipate getting into a group home setting so she can leave the hospital. She currently denies any SIHI, AVH, delusional thoughts or paranoia. She does not appear to be responding to any internal stimuli. There are no changes made on her current plan of care. Continue as already in progress.   Principal Problem: Schizoaffective disorder, depressive type (HCC)  Diagnosis: Principal Problem:   Schizoaffective disorder, depressive type (HCC) Active Problems:   GERD (gastroesophageal reflux disease)   Intellectual disability    Tobacco use disorder  Total Time spent with patient:  35 minutes  Past Psychiatric History: See H&P.  Past Medical History:  Past Medical History:  Diagnosis Date   Bipolar affect, depressed (HCC)    Constipation 08/17/2022   Depression    Falls 07/21/2022   Fracture of femoral neck, right, closed (HCC) 01/17/2022   Herpes simplex 08/22/2017   Open fracture dislocation of right elbow joint 01/17/2022    Past Surgical History:  Procedure Laterality Date   NO PAST SURGERIES     SALPINGECTOMY     Family History: History reviewed. No pertinent family history.  Family Psychiatric  History: See H&P.  Social History:  Social History   Substance and Sexual Activity  Alcohol Use Yes     Social History   Substance and Sexual Activity  Drug Use Yes   Types: Cocaine, Marijuana    Social History   Socioeconomic History   Marital status: Single    Spouse name: Not on file   Number of children: Not on file   Years of education: Not on file   Highest education level: Not on file  Occupational History   Not on file  Tobacco Use   Smoking status: Every Day   Smokeless tobacco: Not on file  Substance and Sexual Activity   Alcohol use: Yes   Drug use: Yes    Types: Cocaine, Marijuana   Sexual activity: Yes  Other Topics Concern   Not on file  Social History Narrative   Not on file   Social Drivers of Corporate Investment Banker  Strain: Low Risk  (09/12/2022)   Received from [facility], [facility]   Overall Financial Resource Strain (CARDIA)    Difficulty of Paying Living Expenses: Not hard at all  Food Insecurity: Patient Declined (12/20/2022)   Hunger Vital Sign    Worried About Running Out of Food in the Last Year: Patient declined    Ran Out of Food in the Last Year: Patient declined  Transportation Needs: No Transportation Needs (12/20/2022)   PRAPARE - Administrator, Civil Service (Medical): No    Lack of Transportation (Non-Medical): No   Physical Activity: Not on file  Stress: No Stress Concern Present (07/17/2022)   Received from [facility], [facility] of Occupational Health - Occupational Stress Questionnaire    Feeling of Stress : Not at all  Social Connections: Unknown (07/16/2022)   Received from [facility], [facility]   Social Network    Social Network: Not on file   Additional Social History:   Sleep: Good  Appetite:  Good  Current Medications: Current Facility-Administered Medications  Medication Dose Route Frequency Provider Last Rate Last Admin   acetaminophen  (TYLENOL ) tablet 650 mg  650 mg Oral Q6H PRN Trudy Carwin, NP   650 mg at 07/05/23 2054   alum & mag hydroxide-simeth (MAALOX/MYLANTA) 200-200-20 MG/5ML suspension 30 mL  30 mL Oral Q4H PRN Onuoha, Chinwendu V, NP   30 mL at 05/31/23 0801   atorvastatin  (LIPITOR) tablet 10 mg  10 mg Oral Daily Anesia Blackwell I, NP   10 mg at 07/11/23 1714   busPIRone  (BUSPAR ) tablet 15 mg  15 mg Oral BID Kaitlan Bin I, NP   15 mg at 07/12/23 0748   cyanocobalamin  (VITAMIN B12) injection 1,000 mcg  1,000 mcg Intramuscular Q30 days Evelena Figures, MD   1,000 mcg at 07/03/23 1009   diphenhydrAMINE  (BENADRYL ) capsule 50 mg  50 mg Oral TID PRN Trudy Carwin, NP   50 mg at 06/03/23 1110   Or   diphenhydrAMINE  (BENADRYL ) injection 50 mg  50 mg Intramuscular TID PRN Trudy Carwin, NP       docusate sodium  (COLACE) capsule 100 mg  100 mg Oral Daily Nkwenti, Doris, NP   100 mg at 07/12/23 9252   haloperidol  (HALDOL ) tablet 5 mg  5 mg Oral TID PRN Collene Gouge I, NP   5 mg at 06/03/23 1110   Or   haloperidol  lactate (HALDOL ) injection 5 mg  5 mg Intramuscular TID PRN Collene Gouge I, NP       hydrocortisone  cream 1 %   Topical BID Ntuen, Tina C, FNP   Given at 07/02/23 1733   hydrOXYzine  (ATARAX ) tablet 25 mg  25 mg Oral TID PRN Nguyen, Julie, DO   25 mg at 07/07/23 9771   LORazepam  (ATIVAN ) tablet 2 mg  2 mg Oral TID PRN Trudy Carwin, NP   2 mg at  06/03/23 1110   Or   LORazepam  (ATIVAN ) injection 2 mg  2 mg Intramuscular TID PRN Trudy Carwin, NP       magnesium  hydroxide (MILK OF MAGNESIA) suspension 30 mL  30 mL Oral Daily PRN Trudy Carwin, NP   30 mL at 06/23/23 1856   melatonin tablet 5 mg  5 mg Oral QHS Nkwenti, Doris, NP   5 mg at 07/11/23 2106   nicotine  polacrilex (NICORETTE ) gum 2 mg  2 mg Oral PRN Goli, Veeraindar, MD   2 mg at 07/09/23 1129   paliperidone  (INVEGA )  24 hr tablet 6 mg  6 mg Oral Daily Nkwenti, Doris, NP   6 mg at 07/11/23 2107   pantoprazole  (PROTONIX ) EC tablet 40 mg  40 mg Oral Daily Tena Linebaugh, Mac I, NP   40 mg at 07/12/23 0747   polyethylene glycol (MIRALAX  / GLYCOLAX ) packet 17 g  17 g Oral Daily PRN Goli, Veeraindar, MD   17 g at 06/07/23 1847   sertraline  (ZOLOFT ) tablet 150 mg  150 mg Oral Daily Massengill, Rankin, MD   150 mg at 07/12/23 0747   sulfamethoxazole -trimethoprim  (BACTRIM  DS) 800-160 MG per tablet 1 tablet  1 tablet Oral Q12H Nkwenti, Donia, NP   1 tablet at 07/12/23 0747   SUMAtriptan  (IMITREX ) tablet 25 mg  25 mg Oral BID PRN Tex Donia, NP   25 mg at 07/10/23 1642   traZODone  (DESYREL ) tablet 50 mg  50 mg Oral QHS Nkwenti, Donia, NP   50 mg at 07/11/23 2106   Vitamin D  (Ergocalciferol ) (DRISDOL ) 1.25 MG (50000 UNIT) capsule 50,000 Units  50,000 Units Oral Q7 days Tex Donia, NP   50,000 Units at 07/06/23 1445   Lab Results:  No results found for this or any previous visit (from the past 48 hours).   Blood Alcohol level:  Lab Results  Component Value Date   ETH <10 12/19/2022   ETH <10 11/21/2019   Metabolic Disorder Labs: Lab Results  Component Value Date   HGBA1C 5.6 06/16/2023   MPG 114.02 06/16/2023   MPG 79.58 12/21/2022   No results found for: PROLACTIN Lab Results  Component Value Date   CHOL 256 (H) 06/16/2023   TRIG 224 (H) 06/16/2023   HDL 83 06/16/2023   CHOLHDL 3.1 06/16/2023   VLDL 45 (H) 06/16/2023   LDLCALC 128 (H) 06/16/2023   LDLCALC 149 (H)  12/21/2022   Physical Findings: AIMS: Facial and Oral Movements Muscles of Facial Expression: None Lips and Perioral Area: None Jaw: None Tongue: None,Extremity Movements Upper (arms, wrists, hands, fingers): None Lower (legs, knees, ankles, toes): None, Trunk Movements Neck, shoulders, hips: None, Global Judgements Severity of abnormal movements overall : None Incapacitation due to abnormal movements: None Patient's awareness of abnormal movements: No Awareness, Dental Status Current problems with teeth and/or dentures?: No Does patient usually wear dentures?: No  CIWA:    COWS:     Musculoskeletal: Strength & Muscle Tone: within normal limits Gait & Station: normal Patient leans:  Walks with a slight  limp like one leg is longer than the other.  Psychiatric Specialty Exam:  Presentation  General Appearance:  Casual; Fairly Groomed; Appropriate for Environment  Eye Contact: Good  Speech: Slow (speech may be garbled at times.)  Speech Volume: Decreased  Handedness: Right   Mood and Affect  Mood: -- (Hopeful)  Affect: Congruent  Thought Process  Thought Processes: Coherent; Linear; Goal Directed  Descriptions of Associations:Intact  Orientation:Full (Time, Place and Person)  Thought Content:Logical  History of Schizophrenia/Schizoaffective disorder:Yes  Duration of Psychotic Symptoms:Greater than six months  Hallucinations:Hallucinations: None Description of Command Hallucinations: NA Description of Auditory Hallucinations: NA  Ideas of Reference:None  Suicidal Thoughts:Suicidal Thoughts: No  Homicidal Thoughts:Homicidal Thoughts: No  Sensorium  Memory: Immediate Fair; Recent Fair; Remote Good  Judgment: Fair  Insight: Good  Executive Functions  Concentration: Fair  Attention Span: Fair  Recall: Good  Fund of Knowledge: Fair  Language: Fair  Psychomotor Activity  Psychomotor Activity: Psychomotor Activity:  Normal  Assets  Assets: Communication Skills; Desire for Improvement; Financial Resources/Insurance;  Resilience  Sleep  Sleep: Sleep: Good Number of Hours of Sleep: 8  Physical Exam: Physical Exam Vitals and nursing note reviewed.  HENT:     Mouth/Throat:     Pharynx: Oropharynx is clear.  Cardiovascular:     Rate and Rhythm: Normal rate.     Pulses: Normal pulses.  Pulmonary:     Effort: Pulmonary effort is normal.  Genitourinary:    Comments: Deferred Musculoskeletal:        General: Normal range of motion.     Cervical back: Normal range of motion.  Skin:    General: Skin is warm and dry.  Neurological:     General: No focal deficit present.     Mental Status: She is alert and oriented to person, place, and time.   Review of Systems  Constitutional:  Negative for chills, diaphoresis and fever.  HENT:  Negative for congestion and sore throat.   Respiratory:  Negative for cough, shortness of breath and wheezing.   Cardiovascular:  Negative for chest pain and palpitations.  Gastrointestinal:  Negative for abdominal pain, constipation, diarrhea, heartburn, nausea and vomiting.  Musculoskeletal:  Negative for joint pain and myalgias.  Skin:  Negative for itching and rash.  Neurological:  Negative for dizziness, tingling, tremors, sensory change, speech change, focal weakness, seizures, loss of consciousness, weakness and headaches.  Endo/Heme/Allergies:        NKDA  Psychiatric/Behavioral:  Positive for depression. Negative for hallucinations, memory loss, substance abuse and suicidal ideas. The patient has insomnia. The patient is not nervous/anxious.    Blood pressure 108/80, pulse 96, temperature 97.8 F (36.6 C), temperature source Oral, resp. rate 18, height 4' 11 (1.499 m), weight 63 kg, SpO2 94%. Body mass index is 28.05 kg/m.  Treatment Plan Summary: Daily contact with patient to assess and evaluate symptoms and progress in treatment and Medication  management.  Continue inpatient hospitalization.  Will continue today 07/12/2023 plan as below except where it is noted.   Patient maintained her baseline. She is tolerating her medications well. There is no dangerousness being displayed on the uint. She is stable for care as soon as we finalize safe and appropriate disposition. We will continue her medications and evaluate her further.   Principal/active diagnoses:  Schizoaffective disorder, depressive type GERD (gastroesophageal reflux disease) Intellectual disability Tobacco use disorder  Plan: The risks/benefits/side-effects/alternatives to the medications in use were discussed in detail with the patient and time was given for patient's questions. The patient consents to medication trial.   1.  Continue Invega  ER 6 mg at bedtime for mood control. 2.  Continue sertraline  150 mg daily for depression.. 3.  Continue trazodone  50 mg as needed for insomnia.  4.  Continue Buspar  15 mg po bid for anxiety.  5.  Continue Melatonin 5 mg po Q hs for insomnia. 6.  Continue to monitor mood behavior and interaction with others. 7.  Continue to encourage unit groups and therapeutic activities.  8.   Continue Bactrim -DS 1 tab BID x 7 days for UTI  Other medical issues:  -Continue  Lipitor 10 mg po q evenings for hyperlipidemia.  -Continue Colace 100 mg po daily for constipation.  -Continue Vit. B-12 po daily for B-12 deficiency.  -Continue Miralax  17 gm po Q daily/prn for constipation.  -Continue Protonix  40 mg po Q am for acid reflux.  -Continue Vit. D 50,000 units pr daily for Vit. D deficiency.  -Continue Imitrex  25 mg po bid prn for migraine headache.  Continue the agitation protocols as recommended.  Other PRNS -Continue Tylenol  650 mg every 6 hours PRN for mild pain -Continue Maalox 30 ml Q 4 hrs PRN for indigestion -Continue MOM 30 ml po Q 6 hrs for constipation  Safety and Monitoring: Voluntary admission to inpatient psychiatric  unit for safety, stabilization and treatment Daily contact with patient to assess and evaluate symptoms and progress in treatment Patient's case to be discussed in multi-disciplinary team meeting Observation Level : q15 minute checks Vital signs: q12 hours Precautions: Safety  Discharge Planning: Social work and case management to assist with discharge planning and identification of hospital follow-up needs prior to discharge Estimated LOS: 5-7 days Discharge Concerns: Need to establish a safety plan; Medication compliance and effectiveness Discharge Goals: Return home with outpatient referrals for mental health follow-up including medication management/psychotherapy  Mac Bolster, NP, pmhnp 07/12/2023, 12:32 PM Patient ID: Yolanda [name], female   DOB: December 10, 1975, 48 y.o.   MRN: [id number]  Patient ID: Yolanda [name], female   DOB: January 24, 1976, 48 y.o.   MRN: [id number] Patient ID: Yolanda [name], female   DOB: 1976/03/09, 47 y.o.   MRN: [id number] Patient ID: Yolanda [name], female   DOB: 1975/08/23, 48 y.o.   MRN: [id number]

## 2023-07-12 NOTE — Progress Notes (Signed)
   07/12/23 0102  Psych Admission Type (Psych Patients Only)  Admission Status Involuntary  Psychosocial Assessment  Patient Complaints Anxiety;Depression;Sadness  Eye Contact Fair  Facial Expression Animated  Affect Anxious  Speech Logical/coherent  Interaction Assertive  Motor Activity Tremors  Appearance/Hygiene Unremarkable;In hospital gown  Behavior Characteristics Cooperative;Appropriate to situation  Mood Depressed;Anxious  Thought Process  Coherency WDL  Content WDL  Delusions None reported or observed  Perception WDL  Hallucination None reported or observed  Judgment Poor  Confusion None  Danger to Self  Current suicidal ideation? Denies  Agreement Not to Harm Self Yes  Description of Agreement verbal  Danger to Others  Danger to Others None reported or observed

## 2023-07-12 NOTE — Plan of Care (Signed)
   Problem: Education: Goal: Emotional status will improve Outcome: Progressing Goal: Mental status will improve Outcome: Progressing   Problem: Activity: Goal: Interest or engagement in activities will improve Outcome: Progressing Goal: Sleeping patterns will improve Outcome: Progressing

## 2023-07-12 NOTE — Progress Notes (Signed)
 CSW spoke pt in regards to disposition. CSW shared with pt that [facility] group [facility] has rescinded admission. Pt thru tears asked, Why. CSW shared that [facility] had difficulty with completion of paperwork in order to transition pt. CSW shared another home, [facility] has submitted paperwork for admission. Pt did not comment and CSW inquired if she had any questions. Pt reported she had no questions. TOC Supervisor, Massie Silvius will schedule meeting with DSS of [place], [facility] and [facility] to determine next steps. CSW will continue to follow.

## 2023-07-12 NOTE — Progress Notes (Signed)
 Pt came to desk with c/o bloody nose, scant amount of dried blood on nares, pt denied when writer asked if she where picking. Pt encourage to wash face Fluids encouraged due to dry internal environment.

## 2023-07-12 NOTE — Group Note (Signed)
 Date:  07/12/2023 Time:  10:36 AM  Group Topic/Focus:  Goals Group:   The focus of this group is to help patients establish daily goals to achieve during treatment and discuss how the patient can incorporate goal setting into their daily lives to aide in recovery. Orientation:   The focus of this group is to educate the patient on the purpose and policies of crisis stabilization and provide a format to answer questions about their admission.  The group details unit policies and expectations of patients while admitted.    Participation Level:  Did Not Attend  Participation Quality:   n/a  Affect:   n/a  Cognitive:   n/a  Insight: None  Engagement in Group:   n/a  Modes of Intervention:   n/a  Additional Comments:   Pt did not attend the group.  Addison HERO Dustin Burrill 07/12/2023, 10:36 AM

## 2023-07-12 NOTE — Group Note (Signed)
 Recreation Therapy Group Note   Group Topic:Team Building  Group Date: 07/12/2023 Start Time: 0930 End Time: 9046 Facilitators: Rawad Bochicchio-McCall, LRT,CTRS Location: [address] Dayroom   Group Topic: Communication, Team Building, Problem Solving  Goal Area(s) Addresses:  Patient will effectively work with peer towards shared goal.  Patient will identify skills used to make activity successful.  Patient will identify how skills used during activity can be used to reach post d/c goals.   Intervention: STEM Activity  Group Description: Straw Bridge. In teams of 3-5, patients were given 15 plastic drinking straws and an equal length of masking tape. Using the materials provided, patients were instructed to build a free standing bridge-like structure to suspend an everyday item (ex: puzzle box) off of the floor or table surface. All materials were required to be used by the team in their design. LRT facilitated post-activity discussion reviewing team process. Patients were encouraged to reflect how the skills used in this activity can be generalized to daily life post discharge.   Education: Pharmacist, Community, Scientist, Physiological, Discharge Planning   Education Outcome: Acknowledges education/In group clarification offered/Needs additional education.    Affect/Mood: N/A   Participation Level: Did not attend    Clinical Observations/Individualized Feedback:      Plan: Continue to engage patient in RT group sessions 2-3x/week.   Yolanda [name], LRT,CTRS 07/12/2023 12:06 PM

## 2023-07-12 NOTE — BH IP Treatment Plan (Addendum)
 Interdisciplinary Treatment and Diagnostic Plan Update  07/12/2023 Time of Session: 11:00 AM - UPDATE Yolanda [name] MRN: [id number]  Principal Diagnosis: Schizoaffective disorder, depressive type (HCC)  Secondary Diagnoses: Principal Problem:   Schizoaffective disorder, depressive type (HCC) Active Problems:   GERD (gastroesophageal reflux disease)   Intellectual disability   Tobacco use disorder   Current Medications:  Current Facility-Administered Medications  Medication Dose Route Frequency Provider Last Rate Last Admin   acetaminophen  (TYLENOL ) tablet 650 mg  650 mg Oral Q6H PRN Trudy Carwin, NP   650 mg at 07/05/23 2054   alum & mag hydroxide-simeth (MAALOX/MYLANTA) 200-200-20 MG/5ML suspension 30 mL  30 mL Oral Q4H PRN Onuoha, Chinwendu V, NP   30 mL at 05/31/23 0801   atorvastatin  (LIPITOR) tablet 10 mg  10 mg Oral Daily Nwoko, Agnes I, NP   10 mg at 07/11/23 1714   busPIRone  (BUSPAR ) tablet 15 mg  15 mg Oral BID Nwoko, Agnes I, NP   15 mg at 07/12/23 0748   cyanocobalamin  (VITAMIN B12) injection 1,000 mcg  1,000 mcg Intramuscular Q30 days Evelena Figures, MD   1,000 mcg at 07/03/23 1009   diphenhydrAMINE  (BENADRYL ) capsule 50 mg  50 mg Oral TID PRN Trudy Carwin, NP   50 mg at 06/03/23 1110   Or   diphenhydrAMINE  (BENADRYL ) injection 50 mg  50 mg Intramuscular TID PRN Trudy Carwin, NP       docusate sodium  (COLACE) capsule 100 mg  100 mg Oral Daily Nkwenti, Doris, NP   100 mg at 07/12/23 0747   haloperidol  (HALDOL ) tablet 5 mg  5 mg Oral TID PRN Collene Gouge I, NP   5 mg at 06/03/23 1110   Or   haloperidol  lactate (HALDOL ) injection 5 mg  5 mg Intramuscular TID PRN Collene Gouge I, NP       hydrocortisone  cream 1 %   Topical BID Ntuen, Tina C, FNP   Given at 07/02/23 1733   hydrOXYzine  (ATARAX ) tablet 25 mg  25 mg Oral TID PRN Nguyen, Julie, DO   25 mg at 07/07/23 9771   LORazepam  (ATIVAN ) tablet 2 mg  2 mg Oral TID PRN Trudy Carwin, NP   2 mg at 06/03/23 1110   Or    LORazepam  (ATIVAN ) injection 2 mg  2 mg Intramuscular TID PRN Trudy Carwin, NP       magnesium  hydroxide (MILK OF MAGNESIA) suspension 30 mL  30 mL Oral Daily PRN Trudy Carwin, NP   30 mL at 06/23/23 1856   melatonin tablet 5 mg  5 mg Oral QHS Nkwenti, Doris, NP   5 mg at 07/11/23 2106   nicotine  polacrilex (NICORETTE ) gum 2 mg  2 mg Oral PRN Goli, Veeraindar, MD   2 mg at 07/09/23 1129   paliperidone  (INVEGA ) 24 hr tablet 6 mg  6 mg Oral Daily Nkwenti, Doris, NP   6 mg at 07/11/23 2107   pantoprazole  (PROTONIX ) EC tablet 40 mg  40 mg Oral Daily Nwoko, Agnes I, NP   40 mg at 07/12/23 0747   polyethylene glycol (MIRALAX  / GLYCOLAX ) packet 17 g  17 g Oral Daily PRN Goli, Veeraindar, MD   17 g at 06/07/23 1847   sertraline  (ZOLOFT ) tablet 150 mg  150 mg Oral Daily Massengill, Rankin, MD   150 mg at 07/12/23 0747   sulfamethoxazole -trimethoprim  (BACTRIM  DS) 800-160 MG per tablet 1 tablet  1 tablet Oral Q12H Tex Drilling, NP   1 tablet at 07/12/23 0747   SUMAtriptan  (  IMITREX ) tablet 25 mg  25 mg Oral BID PRN Tex Drilling, NP   25 mg at 07/10/23 1642   traZODone  (DESYREL ) tablet 50 mg  50 mg Oral QHS Tex Drilling, NP   50 mg at 07/11/23 2106   Vitamin D  (Ergocalciferol ) (DRISDOL ) 1.25 MG (50000 UNIT) capsule 50,000 Units  50,000 Units Oral Q7 days Tex Drilling, NP   50,000 Units at 07/06/23 1445   PTA Medications: Medications Prior to Admission  Medication Sig Dispense Refill Last Dose/Taking   busPIRone  (BUSPAR ) 15 MG tablet Take 15 mg by mouth 2 (two) times daily. (Patient not taking: Reported on 12/19/2022)      paliperidone  (INVEGA  SUSTENNA) 156 MG/ML SUSY injection Inject 156 mg into the muscle once. (Patient not taking: Reported on 12/19/2022)      sertraline  (ZOLOFT ) 50 MG tablet Take 150 mg by mouth daily. (Patient not taking: Reported on 12/19/2022)      traZODone  (DESYREL ) 100 MG tablet Take 100 mg by mouth at bedtime as needed for sleep. (Patient not taking: Reported on 11/12/2022)        Patient Stressors: Medication change or noncompliance    Patient Strengths: Forensic Psychologist fund of knowledge   Treatment Modalities: Medication Management, Group therapy, Case management,  1 to 1 session with clinician, Psychoeducation, Recreational therapy.   Physician Treatment Plan for Primary Diagnosis: Schizoaffective disorder, depressive type (HCC) Long Term Goal(s): Improvement in symptoms so as ready for discharge   Short Term Goals: Ability to identify changes in lifestyle to reduce recurrence of condition will improve Ability to verbalize feelings will improve  Medication Management: Evaluate patient's response, side effects, and tolerance of medication regimen.  Therapeutic Interventions: 1 to 1 sessions, Unit Group sessions and Medication administration.  Evaluation of Outcomes: Progressing  Physician Treatment Plan for Secondary Diagnosis: Principal Problem:   Schizoaffective disorder, depressive type (HCC) Active Problems:   GERD (gastroesophageal reflux disease)   Intellectual disability   Tobacco use disorder  Long Term Goal(s): Improvement in symptoms so as ready for discharge   Short Term Goals: Ability to identify changes in lifestyle to reduce recurrence of condition will improve Ability to verbalize feelings will improve     Medication Management: Evaluate patient's response, side effects, and tolerance of medication regimen.  Therapeutic Interventions: 1 to 1 sessions, Unit Group sessions and Medication administration.  Evaluation of Outcomes: Progressing   RN Treatment Plan for Primary Diagnosis: Schizoaffective disorder, depressive type (HCC) Long Term Goal(s): Knowledge of disease and therapeutic regimen to maintain health will improve  Short Term Goals: Ability to remain free from injury will improve, Ability to verbalize frustration and anger appropriately will improve, Ability to participate in decision making will improve,  Ability to verbalize feelings will improve, Ability to identify and develop effective coping behaviors will improve, and Compliance with prescribed medications will improve  Medication Management: RN will administer medications as ordered by provider, will assess and evaluate patient's response and provide education to patient for prescribed medication. RN will report any adverse and/or side effects to prescribing provider.  Therapeutic Interventions: 1 on 1 counseling sessions, Psychoeducation, Medication administration, Evaluate responses to treatment, Monitor vital signs and CBGs as ordered, Perform/monitor CIWA, COWS, AIMS and Fall Risk screenings as ordered, Perform wound care treatments as ordered.  Evaluation of Outcomes: Progressing   LCSW Treatment Plan for Primary Diagnosis: Schizoaffective disorder, depressive type (HCC) Long Term Goal(s): Safe transition to appropriate next level of care at discharge, Engage patient in therapeutic group addressing  interpersonal concerns.  Short Term Goals: Engage patient in aftercare planning with referrals and resources, Increase ability to appropriately verbalize feelings, Facilitate acceptance of mental health diagnosis and concerns, and Identify triggers associated with mental health/substance abuse issues  Therapeutic Interventions: Assess for all discharge needs, 1 to 1 time with Social worker, Explore available resources and support systems, Assess for adequacy in community support network, Educate family and significant other(s) on suicide prevention, Complete Psychosocial Assessment, Interpersonal group therapy.  Evaluation of Outcomes: Progressing   Progress in Treatment: Attending groups: No Participating in groups: No Taking medication as prescribed: Yes. Toleration medication: Yes. Family/Significant other contact made: Yes, individual(s) contacted:  Yolanda [name] [id number] Patient understands diagnosis: Yes. Discussing patient  identified problems/goals with staff: Yes. Medical problems stabilized or resolved: Yes. Denies suicidal/homicidal ideation: Yes. Issues/concerns per patient self-inventory: No.   New problem(s) identified: none reported   New Short Term/Long Term Goal(s): medication stabilization, elimination of SI thoughts, development of comprehensive mental wellness plan.      Patient Goals:   placement   Discharge Plan or Barriers: Patient recently admitted. CSW will continue to follow and assess for appropriate referrals and possible discharge planning.      Reason for Continuation of Hospitalization: Depression Suicidal ideation   Estimated Length of Stay: Pt still expected to discharge  to group home awaiting Single Case agreement and enhanced rate from [facility].  Last 3 [place] Suicide Severity Risk Score: Flowsheet Row Ad[facility] (Current) [facility] from 12/19/2022 in [facility] at [facility] ED from 11/12/2022 in [facility] Department [facility]  C-SSRS RISK CATEGORY High Risk High Risk Moderate Risk       Last [facility] 2/9 Scores:     No data to display          Scribe for Treatment Team: Marieanne Marxen O Isaly Fasching, LCSWA 07/12/2023 3:26 PM

## 2023-07-12 NOTE — Discharge Planning (Signed)
 LCSW contacted Group Home Representative Adrien Essex at [facility] in [place] to inquire about updates. Per [name], she has submitted the paperwork to [facility] department for review on today with hopes to hear back no later than Tuesday of next week. LCSW explored if Representative needs to interview or see the patient prior to admission, and [name] reported based on the notes she has no concerns regarding the patient. Per [name], if accepted, the patient will admit to a two bed licensed facility that is women only. Location of Group Home is in [place]. More information to follow as updates can be provided. LCSW has been advised to follow up with Mrs. Debra on Monday to touch basis. No other needs to report at this time.   LCSW has provided an update to [employer] at [facility], [facility] Supervisor Massie Silvius, DSS Representative Arch Ada, and Care Manager Marthe Pouch.    Merlynn Lazier, LCSW Clinical Social Worker [facility] Ph: [phone]

## 2023-07-13 DIAGNOSIS — F251 Schizoaffective disorder, depressive type: Secondary | ICD-10-CM | POA: Diagnosis not present

## 2023-07-13 MED ORDER — SELENIUM SULFIDE 1 % EX LOTN: TOPICAL_LOTION | Freq: Every day | CUTANEOUS | Status: DC | PRN

## 2023-07-13 NOTE — [facility] Group Notes (Signed)
 Psychoeducational Group Note  Date:  07/13/2023 Time:  2000  Group Topic/Focus:  Wrap up group  Participation Level: Did Not Attend  Participation Quality:  Not Applicable  Affect:  Not Applicable  Cognitive:  Not Applicable  Insight:  Not Applicable  Engagement in Group: Not Applicable  Additional Comments:  Did not attend.   Lenora Manuelita RAMAN 07/13/2023, 9:20 PM

## 2023-07-13 NOTE — Progress Notes (Signed)
 D) Pt received calm, visible, participating in milieu, and in no acute distress. Pt A & O x4. Pt denies SI, HI, A/ V H, depression, anxiety and pain at this time. A) Pt encouraged to drink fluids. Pt encouraged to come to staff with needs. Pt encouraged to attend and participate in groups. Pt encouraged to set reachable goals.  R) Pt remained safe on unit, in no acute distress, will continue to assess.     07/12/23 2000  Psych Admission Type (Psych Patients Only)  Admission Status Involuntary  Psychosocial Assessment  Patient Complaints Depression  Eye Contact Fair  Facial Expression Animated  Affect Anxious  Speech Logical/coherent  Interaction Assertive  Motor Activity Tremors  Appearance/Hygiene Disheveled  Behavior Characteristics Appropriate to situation  Mood Sad  Thought Process  Coherency WDL  Content WDL  Delusions None reported or observed  Perception WDL  Hallucination None reported or observed  Judgment Impaired  Confusion None  Danger to Self  Current suicidal ideation? Denies  Self-Injurious Behavior No self-injurious ideation or behavior indicators observed or expressed   Agreement Not to Harm Self Yes  Description of Agreement verbal  Danger to Others  Danger to Others None reported or observed  Danger to Others Abnormal  Harmful Behavior to others No threats or harm toward other people  Destructive Behavior No threats or harm toward property

## 2023-07-13 NOTE — Group Note (Signed)
 Date:  07/13/2023 Time:  11:36 AM  Group Topic/Focus:  Goals Group:   The focus of this group is to help patients establish daily goals to achieve during treatment and discuss how the patient can incorporate goal setting into their daily lives to aide in recovery. Orientation:   The focus of this group is to educate the patient on the purpose and policies of crisis stabilization and provide a format to answer questions about their admission.  The group details unit policies and expectations of patients while admitted.    Participation Level:  Did Not Attend  Participation Quality:   n/a  Affect:   n/a  Cognitive:   n/a  Insight: None  Engagement in Group:   n/a  Modes of Intervention:   n/a  Additional Comments:   Pt did not attend the group.  Addison HERO Jamyiah Labella 07/13/2023, 11:36 AM

## 2023-07-13 NOTE — Progress Notes (Signed)
 [facility] MD Progress Note  07/13/2023 1:20 PM Yolanda [name]  MRN:  [id number] Subjective:   48 year old African-American female, single, on SSI disability, homeless.  Background history of intellectual disability, substance use disorder and schizoaffective disorder depressive type.  Presented via emergency services on account of worsening depression associated with suicidal thoughts.  Expressed thoughts of suicide by stabbing herself.  Chart reviewed today.  Patient discussed at multidisciplinary team meeting.  Staff reports that she slept well.  She has been adherent with her medicine.  No PRNs lately.  Seen today.  Disappointed that she has not found a place yet.  States that she asked her mother if she could return back to her mother's place.  Her mother wants to think about it.  No changes in her mental state.  No side effects from her medications.  Not endorsing any violent thoughts. Encouraged to keep ventilating her feelings to staff.   Principal Problem: Schizoaffective disorder, depressive type (HCC) Diagnosis: Principal Problem:   Schizoaffective disorder, depressive type (HCC) Active Problems:   GERD (gastroesophageal reflux disease)   Intellectual disability   Tobacco use disorder  Total Time spent with patient: 20 minutes  Past Psychiatric History: Extensive history of mental illness.  She has been tried on multiple medications over the years.  See H&P for full details.  Past Medical History:  Past Medical History:  Diagnosis Date   Bipolar affect, depressed (HCC)    Constipation 08/17/2022   Depression    Falls 07/21/2022   Fracture of femoral neck, right, closed (HCC) 01/17/2022   Herpes simplex 08/22/2017   Open fracture dislocation of right elbow joint 01/17/2022    Past Surgical History:  Procedure Laterality Date   NO PAST SURGERIES     SALPINGECTOMY     Family History: History reviewed. No pertinent family history. Family Psychiatric  History: Extensive  family history of mood disorder. Social History:  Social History   Substance and Sexual Activity  Alcohol Use Yes     Social History   Substance and Sexual Activity  Drug Use Yes   Types: Cocaine, Marijuana    Social History   Socioeconomic History   Marital status: Single    Spouse name: Not on file   Number of children: Not on file   Years of education: Not on file   Highest education level: Not on file  Occupational History   Not on file  Tobacco Use   Smoking status: Every Day   Smokeless tobacco: Not on file  Substance and Sexual Activity   Alcohol use: Yes   Drug use: Yes    Types: Cocaine, Marijuana   Sexual activity: Yes  Other Topics Concern   Not on file  Social History Narrative   Not on file   Social Drivers of Health   Financial Resource Strain: Low Risk  (09/12/2022)   Received from [facility], [facility]   Overall Financial Resource Strain (CARDIA)    Difficulty of Paying Living Expenses: Not hard at all  Food Insecurity: Patient Declined (12/20/2022)   Hunger Vital Sign    Worried About Running Out of Food in the Last Year: Patient declined    Ran Out of Food in the Last Year: Patient declined  Transportation Needs: No Transportation Needs (12/20/2022)   PRAPARE - Administrator, Civil Service (Medical): No    Lack of Transportation (Non-Medical): No  Physical Activity: Not on file  Stress: No Stress Concern Present (07/17/2022)   Received  from [facility], [facility]   [employer] of Occupational Health - Occupational Stress Questionnaire    Feeling of Stress : Not at all  Social Connections: Unknown (07/16/2022)   Received from [facility], [facility]   Social Network    Social Network: Not on file     Current Medications: Current Facility-Administered Medications  Medication Dose Route Frequency Provider Last Rate Last Admin   acetaminophen  (TYLENOL ) tablet 650 mg  650 mg Oral Q6H PRN Trudy Carwin, NP   650  mg at 07/12/23 1537   alum & mag hydroxide-simeth (MAALOX/MYLANTA) 200-200-20 MG/5ML suspension 30 mL  30 mL Oral Q4H PRN Onuoha, Chinwendu V, NP   30 mL at 05/31/23 0801   atorvastatin  (LIPITOR) tablet 10 mg  10 mg Oral Daily Nwoko, Agnes I, NP   10 mg at 07/12/23 1651   busPIRone  (BUSPAR ) tablet 15 mg  15 mg Oral BID Nwoko, Agnes I, NP   15 mg at 07/13/23 0749   cyanocobalamin  (VITAMIN B12) injection 1,000 mcg  1,000 mcg Intramuscular Q30 days Evelena Figures, MD   1,000 mcg at 07/03/23 1009   diphenhydrAMINE  (BENADRYL ) capsule 50 mg  50 mg Oral TID PRN Trudy Carwin, NP   50 mg at 06/03/23 1110   Or   diphenhydrAMINE  (BENADRYL ) injection 50 mg  50 mg Intramuscular TID PRN Trudy Carwin, NP       docusate sodium  (COLACE) capsule 100 mg  100 mg Oral Daily Nkwenti, Doris, NP   100 mg at 07/13/23 0749   haloperidol  (HALDOL ) tablet 5 mg  5 mg Oral TID PRN Collene Gouge I, NP   5 mg at 06/03/23 1110   Or   haloperidol  lactate (HALDOL ) injection 5 mg  5 mg Intramuscular TID PRN Collene Gouge I, NP       hydrocortisone  cream 1 %   Topical BID Ntuen, Tina C, FNP   Given at 07/02/23 1733   hydrOXYzine  (ATARAX ) tablet 25 mg  25 mg Oral TID PRN Nguyen, Julie, DO   25 mg at 07/07/23 9771   LORazepam  (ATIVAN ) tablet 2 mg  2 mg Oral TID PRN Trudy Carwin, NP   2 mg at 06/03/23 1110   Or   LORazepam  (ATIVAN ) injection 2 mg  2 mg Intramuscular TID PRN Trudy Carwin, NP       magnesium  hydroxide (MILK OF MAGNESIA) suspension 30 mL  30 mL Oral Daily PRN Trudy Carwin, NP   30 mL at 06/23/23 1856   melatonin tablet 5 mg  5 mg Oral QHS Nkwenti, Doris, NP   5 mg at 07/12/23 2117   nicotine  polacrilex (NICORETTE ) gum 2 mg  2 mg Oral PRN Goli, Veeraindar, MD   2 mg at 07/09/23 1129   paliperidone  (INVEGA ) 24 hr tablet 6 mg  6 mg Oral Daily Nkwenti, Doris, NP   6 mg at 07/12/23 2118   pantoprazole  (PROTONIX ) EC tablet 40 mg  40 mg Oral Daily Nwoko, Agnes I, NP   40 mg at 07/13/23 0749   polyethylene glycol (MIRALAX  /  GLYCOLAX ) packet 17 g  17 g Oral Daily PRN Goli, Veeraindar, MD   17 g at 06/07/23 1847   selenium  sulfide (SELSUN ) 1 % shampoo   Topical Daily PRN Liberty Stead, Yolanda LABOR, MD       sertraline  (ZOLOFT ) tablet 150 mg  150 mg Oral Daily Massengill, Rankin, MD   150 mg at 07/13/23 0749   sulfamethoxazole -trimethoprim  (BACTRIM  DS) 800-160 MG per tablet 1 tablet  1 tablet Oral Q12H Tex Drilling, NP   1 tablet at 07/13/23 9250   SUMAtriptan  (IMITREX ) tablet 25 mg  25 mg Oral BID PRN Tex Drilling, NP   25 mg at 07/10/23 1642   traZODone  (DESYREL ) tablet 50 mg  50 mg Oral QHS Nkwenti, Doris, NP   50 mg at 07/12/23 2118   Vitamin D  (Ergocalciferol ) (DRISDOL ) 1.25 MG (50000 UNIT) capsule 50,000 Units  50,000 Units Oral Q7 days Tex Drilling, NP   50,000 Units at 07/06/23 1445    Lab Results: No results found for this or any previous visit (from the past 48 hours).  Blood Alcohol level:  Lab Results  Component Value Date   ETH <10 12/19/2022   ETH <10 11/21/2019    Metabolic Disorder Labs: Lab Results  Component Value Date   HGBA1C 5.6 06/16/2023   MPG 114.02 06/16/2023   MPG 79.58 12/21/2022   No results found for: PROLACTIN Lab Results  Component Value Date   CHOL 256 (H) 06/16/2023   TRIG 224 (H) 06/16/2023   HDL 83 06/16/2023   CHOLHDL 3.1 06/16/2023   VLDL 45 (H) 06/16/2023   LDLCALC 128 (H) 06/16/2023   LDLCALC 149 (H) 12/21/2022    Physical Findings: AIMS: Facial and Oral Movements Muscles of Facial Expression: None Lips and Perioral Area: None Jaw: None Tongue: None,Extremity Movements Upper (arms, wrists, hands, fingers): None Lower (legs, knees, ankles, toes): None, Trunk Movements Neck, shoulders, hips: None, Global Judgements Severity of abnormal movements overall : None Incapacitation due to abnormal movements: None Patient's awareness of abnormal movements: No Awareness, Dental Status Current problems with teeth and/or dentures?: No Does patient usually wear  dentures?: No  CIWA:    COWS:     Musculoskeletal: Strength & Muscle Tone: within normal limits Gait & Station: normal Patient leans: N/A  Psychiatric Specialty Exam:  Presentation  General Appearance:  Casually dressed, overweight, not in any distress.  No EPS. Eye Contact: Good  Speech: Slurred and slow.  Speech Volume: At baseline.  Mood and Affect  Mood: Depressed but not pervasively so. Affect: Blunted and mood congruent.  Thought Process  Thought Processes: Decreased speed of thoughts but goal-directed.  Descriptions of Associations:  Linear.  Orientation:Full (Time, Place and Person)  Thought Content: Negative ruminations about early life events.  Negative rumination about being in the hospital during the holidays.  No active suicidal thoughts.  No homicidal thoughts.  No thoughts of violence.  No delusional preoccupation.   Hallucinations: No hallucination in any modality.   Sensorium  Memory: Immediate Fair; Recent Fair; Remote Good  Judgment: Fair  Insight: Good   Executive Functions  Concentration: Fair  Attention Span: Fair  Recall: Good  Fund of Knowledge: Fair  Language: Fair   Psychomotor Activity  Psychomotor Activity: No data recorded     Physical Exam: Physical Exam ROS Blood pressure 97/62, pulse 67, temperature 97.8 F (36.6 C), temperature source Oral, resp. rate 18, height 4' 11 (1.499 m), weight 63 kg, SpO2 97%. Body mass index is 28.05 kg/m.   Treatment Plan Summary: Patient is at her baseline.  She is tolerating her medications well.  There is no dangerousness.  She is stable for care as soon as we finalize safe an appropriate disposition.  We will continue her medications and evaluate her further.  1.  Continue Invega  ER 6 mg at bedtime. 2.  Continue sertraline  150 mg daily. 3.  Continue trazodone  50 mg as needed for insomnia. 4.  Continue  to monitor mood behavior and interaction with others. 5.   Continue to encourage unit groups and therapeutic activities.   Yolanda DELENA Forehand, MD 07/13/2023, 1:20 PM

## 2023-07-13 NOTE — Progress Notes (Signed)
   07/13/23 1200  Psych Admission Type (Psych Patients Only)  Admission Status Involuntary  Psychosocial Assessment  Patient Complaints Depression  Eye Contact Fair  Facial Expression Animated  Affect Sad  Speech Logical/coherent  Interaction Assertive  Motor Activity Tremors  Appearance/Hygiene Disheveled  Behavior Characteristics Cooperative;Calm  Mood Sad  Thought Process  Coherency WDL  Content WDL  Delusions None reported or observed  Perception WDL  Hallucination None reported or observed  Judgment Impaired  Confusion None  Danger to Self  Current suicidal ideation? Denies  Self-Injurious Behavior No self-injurious ideation or behavior indicators observed or expressed

## 2023-07-14 DIAGNOSIS — F251 Schizoaffective disorder, depressive type: Secondary | ICD-10-CM | POA: Diagnosis not present

## 2023-07-14 NOTE — Progress Notes (Signed)
   07/14/23 1500  Psych Admission Type (Psych Patients Only)  Admission Status Involuntary  Psychosocial Assessment  Patient Complaints Depression  Eye Contact Fair  Facial Expression Animated  Affect Appropriate to circumstance  Speech Logical/coherent  Interaction Assertive  Motor Activity Tremors  Appearance/Hygiene In hospital gown  Behavior Characteristics Cooperative;Appropriate to situation;Calm  Mood Depressed  Thought Process  Coherency WDL  Content WDL  Delusions None reported or observed  Perception WDL  Hallucination None reported or observed  Judgment Impaired  Confusion None  Danger to Self  Current suicidal ideation? Denies  Self-Injurious Behavior No self-injurious ideation or behavior indicators observed or expressed   Agreement Not to Harm Self Yes  Description of Agreement agreed to contact staff before acting on harmful thoughts

## 2023-07-14 NOTE — Plan of Care (Signed)
  Problem: Education: Goal: Knowledge of [facility] Education information/materials will improve Outcome: Progressing   Problem: Activity: Goal: Interest or engagement in activities will improve Outcome: Progressing   Problem: Education: Goal: Ability to make informed decisions regarding treatment will improve Outcome: Progressing   Problem: Medication: Goal: Compliance with prescribed medication regimen will improve Outcome: Progressing

## 2023-07-14 NOTE — [facility] Group Notes (Signed)
 [facility] Group Notes:  (Nursing)  Date:  07/14/2023  Time:  1400  Type of Therapy:  Psychoeducational Skills  Participation Level:  Did Not Attend   Shela Nevin 07/14/2023, 3:48 PM

## 2023-07-14 NOTE — Progress Notes (Signed)
 [facility] MD Progress Note  07/14/2023 1:24 PM Yolanda [name]  MRN:  [id number] Subjective:   48 year old African-American female, single, on SSI disability, homeless.  Background history of intellectual disability, substance use disorder and schizoaffective disorder depressive type.  Presented via emergency services on account of worsening depression associated with suicidal thoughts.  Expressed thoughts of suicide by stabbing herself.  Chart reviewed today.  Patient discussed at multidisciplinary team meeting.  Staff reports that she slept well last night.  No changes in her mental state.  No PRNs required lately.  Seen today.  Not endorsing any new concerns.  She is eager to get discharged sooner rather than later.  She is not endorsing any manic symptoms.  No psychotic symptoms.  She is not endorsing any rageful thoughts towards herself or towards anybody else.  No side effects from her medication.  Encouraged.  Principal Problem: Schizoaffective disorder, depressive type (HCC) Diagnosis: Principal Problem:   Schizoaffective disorder, depressive type (HCC) Active Problems:   GERD (gastroesophageal reflux disease)   Intellectual disability   Tobacco use disorder  Total Time spent with patient: 20 minutes  Past Psychiatric History: Extensive history of mental illness.  She has been tried on multiple medications over the years.  See H&P for full details.  Past Medical History:  Past Medical History:  Diagnosis Date   Bipolar affect, depressed (HCC)    Constipation 08/17/2022   Depression    Falls 07/21/2022   Fracture of femoral neck, right, closed (HCC) 01/17/2022   Herpes simplex 08/22/2017   Open fracture dislocation of right elbow joint 01/17/2022    Past Surgical History:  Procedure Laterality Date   NO PAST SURGERIES     SALPINGECTOMY     Family History: History reviewed. No pertinent family history. Family Psychiatric  History: Extensive family history of mood  disorder. Social History:  Social History   Substance and Sexual Activity  Alcohol Use Yes     Social History   Substance and Sexual Activity  Drug Use Yes   Types: Cocaine, Marijuana    Social History   Socioeconomic History   Marital status: Single    Spouse name: Not on file   Number of children: Not on file   Years of education: Not on file   Highest education level: Not on file  Occupational History   Not on file  Tobacco Use   Smoking status: Every Day   Smokeless tobacco: Not on file  Substance and Sexual Activity   Alcohol use: Yes   Drug use: Yes    Types: Cocaine, Marijuana   Sexual activity: Yes  Other Topics Concern   Not on file  Social History Narrative   Not on file   Social Drivers of Health   Financial Resource Strain: Low Risk  (09/12/2022)   Received from [facility], [facility]   Overall Financial Resource Strain (CARDIA)    Difficulty of Paying Living Expenses: Not hard at all  Food Insecurity: Patient Declined (12/20/2022)   Hunger Vital Sign    Worried About Running Out of Food in the Last Year: Patient declined    Ran Out of Food in the Last Year: Patient declined  Transportation Needs: No Transportation Needs (12/20/2022)   PRAPARE - Administrator, Civil Service (Medical): No    Lack of Transportation (Non-Medical): No  Physical Activity: Not on file  Stress: No Stress Concern Present (07/17/2022)   Received from [facility], [facility]   [facility]  of Occupational Health - Occupational Stress Questionnaire    Feeling of Stress : Not at all  Social Connections: Unknown (07/16/2022)   Received from [facility], [facility]   Social Network    Social Network: Not on file     Current Medications: Current Facility-Administered Medications  Medication Dose Route Frequency Provider Last Rate Last Admin   acetaminophen  (TYLENOL ) tablet 650 mg  650 mg Oral Q6H PRN Trudy Carwin, NP   650 mg at 07/13/23 2128    alum & mag hydroxide-simeth (MAALOX/MYLANTA) 200-200-20 MG/5ML suspension 30 mL  30 mL Oral Q4H PRN Onuoha, Chinwendu V, NP   30 mL at 05/31/23 0801   atorvastatin  (LIPITOR) tablet 10 mg  10 mg Oral Daily Nwoko, Agnes I, NP   10 mg at 07/13/23 1731   busPIRone  (BUSPAR ) tablet 15 mg  15 mg Oral BID Nwoko, Agnes I, NP   15 mg at 07/14/23 0802   cyanocobalamin  (VITAMIN B12) injection 1,000 mcg  1,000 mcg Intramuscular Q30 days Evelena Figures, MD   1,000 mcg at 07/03/23 1009   diphenhydrAMINE  (BENADRYL ) capsule 50 mg  50 mg Oral TID PRN Trudy Carwin, NP   50 mg at 06/03/23 1110   Or   diphenhydrAMINE  (BENADRYL ) injection 50 mg  50 mg Intramuscular TID PRN Trudy Carwin, NP       docusate sodium  (COLACE) capsule 100 mg  100 mg Oral Daily Nkwenti, Doris, NP   100 mg at 07/14/23 0802   haloperidol  (HALDOL ) tablet 5 mg  5 mg Oral TID PRN Collene Gouge I, NP   5 mg at 06/03/23 1110   Or   haloperidol  lactate (HALDOL ) injection 5 mg  5 mg Intramuscular TID PRN Collene Gouge I, NP       hydrocortisone  cream 1 %   Topical BID Ntuen, Tina C, FNP   Given at 07/02/23 1733   hydrOXYzine  (ATARAX ) tablet 25 mg  25 mg Oral TID PRN Nguyen, Julie, DO   25 mg at 07/07/23 9771   LORazepam  (ATIVAN ) tablet 2 mg  2 mg Oral TID PRN Trudy Carwin, NP   2 mg at 06/03/23 1110   Or   LORazepam  (ATIVAN ) injection 2 mg  2 mg Intramuscular TID PRN Trudy Carwin, NP       magnesium  hydroxide (MILK OF MAGNESIA) suspension 30 mL  30 mL Oral Daily PRN Trudy Carwin, NP   30 mL at 06/23/23 1856   melatonin tablet 5 mg  5 mg Oral QHS Nkwenti, Doris, NP   5 mg at 07/13/23 2101   nicotine  polacrilex (NICORETTE ) gum 2 mg  2 mg Oral PRN Goli, Veeraindar, MD   2 mg at 07/13/23 1520   paliperidone  (INVEGA ) 24 hr tablet 6 mg  6 mg Oral Daily Nkwenti, Doris, NP   6 mg at 07/13/23 2101   pantoprazole  (PROTONIX ) EC tablet 40 mg  40 mg Oral Daily Nwoko, Agnes I, NP   40 mg at 07/14/23 0802   polyethylene glycol (MIRALAX  / GLYCOLAX ) packet 17 g  17  g Oral Daily PRN Goli, Veeraindar, MD   17 g at 06/07/23 1847   selenium  sulfide (SELSUN ) 1 % shampoo   Topical Daily PRN Brynleigh Sequeira, Jerrell LABOR, MD       sertraline  (ZOLOFT ) tablet 150 mg  150 mg Oral Daily Massengill, Rankin, MD   150 mg at 07/14/23 0803   sulfamethoxazole -trimethoprim  (BACTRIM  DS) 800-160 MG per tablet 1 tablet  1 tablet Oral Q12H Tex Drilling, NP  1 tablet at 07/14/23 0802   SUMAtriptan  (IMITREX ) tablet 25 mg  25 mg Oral BID PRN Tex Drilling, NP   25 mg at 07/10/23 1642   traZODone  (DESYREL ) tablet 50 mg  50 mg Oral QHS Nkwenti, Doris, NP   50 mg at 07/13/23 2101   Vitamin D  (Ergocalciferol ) (DRISDOL ) 1.25 MG (50000 UNIT) capsule 50,000 Units  50,000 Units Oral Q7 days Tex Drilling, NP   50,000 Units at 07/13/23 1731    Lab Results: No results found for this or any previous visit (from the past 48 hours).  Blood Alcohol level:  Lab Results  Component Value Date   ETH <10 12/19/2022   ETH <10 11/21/2019    Metabolic Disorder Labs: Lab Results  Component Value Date   HGBA1C 5.6 06/16/2023   MPG 114.02 06/16/2023   MPG 79.58 12/21/2022   No results found for: PROLACTIN Lab Results  Component Value Date   CHOL 256 (H) 06/16/2023   TRIG 224 (H) 06/16/2023   HDL 83 06/16/2023   CHOLHDL 3.1 06/16/2023   VLDL 45 (H) 06/16/2023   LDLCALC 128 (H) 06/16/2023   LDLCALC 149 (H) 12/21/2022    Physical Findings: AIMS: Facial and Oral Movements Muscles of Facial Expression: None Lips and Perioral Area: None Jaw: None Tongue: None,Extremity Movements Upper (arms, wrists, hands, fingers): None Lower (legs, knees, ankles, toes): None, Trunk Movements Neck, shoulders, hips: None, Global Judgements Severity of abnormal movements overall : None Incapacitation due to abnormal movements: None Patient's awareness of abnormal movements: No Awareness, Dental Status Current problems with teeth and/or dentures?: No Does patient usually wear dentures?: No  CIWA:     COWS:     Musculoskeletal: Strength & Muscle Tone: within normal limits Gait & Station: normal Patient leans: N/A  Psychiatric Specialty Exam:  Presentation  General Appearance:  Casually dressed, overweight, not in any distress.  No EPS. Eye Contact: Good  Speech: Slurred and slow.  Speech Volume: At baseline.  Mood and Affect  Mood: Depressed but not pervasively so. Affect: Blunted and mood congruent.  Thought Process  Thought Processes: Decreased speed of thoughts but goal-directed.  Descriptions of Associations:  Linear.  Orientation:Full (Time, Place and Person)  Thought Content: Negative ruminations about early life events.  Negative rumination about being in the hospital during the holidays.  No active suicidal thoughts.  No homicidal thoughts.  No thoughts of violence.  No delusional preoccupation.   Hallucinations: No hallucination in any modality.   Sensorium  Memory: Immediate Fair; Recent Fair; Remote Good  Judgment: Fair  Insight: Good   Executive Functions  Concentration: Fair  Attention Span: Fair  Recall: Good  Fund of Knowledge: Fair  Language: Fair   Psychomotor Activity  Psychomotor Activity: No data recorded     Physical Exam: Physical Exam ROS Blood pressure 102/82, pulse 79, temperature 97.8 F (36.6 C), temperature source Oral, resp. rate 18, height 4' 11 (1.499 m), weight 63 kg, SpO2 98%. Body mass index is 28.05 kg/m.   Treatment Plan Summary: Patient is at her baseline.  She is tolerating her medications well.  There is no dangerousness.  She is stable for care as soon as we finalize safe an appropriate disposition.  We will continue her medications and evaluate her further.  1.  Continue Invega  ER 6 mg at bedtime. 2.  Continue sertraline  150 mg daily. 3.  Continue trazodone  50 mg as needed for insomnia. 4.  Continue to monitor mood behavior and interaction with others. 5.  Continue to encourage  unit groups and therapeutic activities.   Jerrell DELENA Forehand, MD 07/14/2023, 1:24 PM

## 2023-07-14 NOTE — Group Note (Signed)
 LCSW Group Therapy Note  Group Date: 07/14/2023 Start Time: 1000 End Time: 1100   Type of Therapy and Topic:  Group Therapy: Wellness/ Positive Affirmations  Participation Level:  Did Not Attend   Description of Group:   This group addressed positive affirmation towards self and others.  Patients went around the room and identified two positive things about themselves and two positive things about a peer in the room.  Patients reflected on how it felt to share something positive with others, to identify positive things about themselves, and to hear positive things from others/ Patients were encouraged to have a daily reflection of positive characteristics or circumstances.   Therapeutic Goals: Patients will verbalize two of their positive qualities Patients will demonstrate empathy for others by stating two positive qualities about a peer in the group Patients will verbalize their feelings when voicing positive self affirmations and when voicing positive affirmations of others Patients will discuss the potential positive impact on their wellness/recovery of focusing on positive traits of self and others.  Summary of Patient Progress:  Pt invited, did not attend.   Therapeutic Modalities:   Cognitive Behavioral Therapy Motivational Interviewing    Golda Louder, LCSWA 07/14/2023  3:11 PM

## 2023-07-14 NOTE — Group Note (Signed)
 Date:  07/14/2023 Time:  6:44 PM  Group Topic/Focus:  Goals Group:   The focus of this group is to help patients establish daily goals to achieve during treatment and discuss how the patient can incorporate goal setting into their daily lives to aide in recovery. Orientation:   The focus of this group is to educate the patient on the purpose and policies of crisis stabilization and provide a format to answer questions about their admission.  The group details unit policies and expectations of patients while admitted.    Participation Level:  Did Not Attend   Yolanda [name] 07/14/2023, 6:44 PM

## 2023-07-14 NOTE — Plan of Care (Signed)
   Problem: Education: Goal: Ability to make informed decisions regarding treatment will improve Outcome: Progressing   Problem: Coping: Goal: Coping ability will improve Outcome: Progressing

## 2023-07-14 NOTE — Progress Notes (Signed)
   07/13/23 2300  Psych Admission Type (Psych Patients Only)  Admission Status Involuntary  Psychosocial Assessment  Patient Complaints Depression  Eye Contact Fair  Facial Expression Animated  Affect Appropriate to circumstance  Speech Logical/coherent  Interaction Assertive  Motor Activity Tremors  Appearance/Hygiene In hospital gown  Behavior Characteristics Cooperative;Appropriate to situation  Mood Depressed  Thought Process  Coherency WDL  Content WDL  Delusions None reported or observed  Perception WDL  Hallucination None reported or observed  Judgment Impaired  Confusion None  Danger to Self  Current suicidal ideation? Denies  Self-Injurious Behavior No self-injurious ideation or behavior indicators observed or expressed   Agreement Not to Harm Self Yes  Description of Agreement verbal  Danger to Others  Danger to Others None reported or observed  Danger to Others Abnormal  Harmful Behavior to others No threats or harm toward other people  Destructive Behavior No threats or harm toward property

## 2023-07-14 NOTE — [facility] Group Notes (Signed)
 Adult Psychoeducational Group Note  Date:  07/14/2023 Time:  9:20 PM  Group Topic/Focus:  Wrap-Up Group:   The focus of this group is to help patients review their daily goal of treatment and discuss progress on daily workbooks.  Participation Level:  Active  Participation Quality:  Appropriate  Affect:  Appropriate  Cognitive:  Appropriate  Insight: Appropriate  Engagement in Group:  Engaged  Modes of Intervention:  Discussion and Support  Additional Comments:  Pt told that today was a good day on the unit, the highlight of which was getting off the unit for a meal in the cafeteria. On the subject of goals for the coming week, Pt mentioned wanting to get out of her room more and also attend more groups, which the Writer encouraged. Pt rated her day a 5 out of 10.  Yolanda [name] 07/14/2023, 9:20 PM

## 2023-07-15 DIAGNOSIS — F251 Schizoaffective disorder, depressive type: Secondary | ICD-10-CM | POA: Diagnosis not present

## 2023-07-15 MED ORDER — LEVOTHYROXINE SODIUM 25 MCG PO TABS
25.0000 ug | ORAL_TABLET | Freq: Every day | ORAL | Status: DC
  Administered 2023-07-16 – 2023-07-28 (×13): 25 ug via ORAL
  Filled 2023-07-15 (×16): qty 1

## 2023-07-15 NOTE — Progress Notes (Signed)
   07/14/23 2314  Psych Admission Type (Psych Patients Only)  Admission Status Involuntary  Psychosocial Assessment  Patient Complaints Depression  Eye Contact Fair  Facial Expression Animated  Affect Appropriate to circumstance  Speech Logical/coherent  Interaction Assertive  Motor Activity Tremors  Appearance/Hygiene In hospital gown  Behavior Characteristics Cooperative;Appropriate to situation  Mood Depressed  Thought Process  Coherency WDL  Content WDL  Delusions None reported or observed  Perception WDL  Hallucination None reported or observed  Judgment Impaired  Confusion None  Danger to Self  Current suicidal ideation? Denies  Self-Injurious Behavior No self-injurious ideation or behavior indicators observed or expressed   Agreement Not to Harm Self Yes  Description of Agreement verbal  Danger to Others  Danger to Others None reported or observed  Danger to Others Abnormal  Harmful Behavior to others No threats or harm toward other people  Destructive Behavior No threats or harm toward property

## 2023-07-15 NOTE — Progress Notes (Signed)
 D:  Patient's self inventory sheet, patient sleeps good, sleep medication helpful.  Good appetite, low energy level, good concentration.  Rated depression, hopeless and anxiety 5.  Denied withdrawals.  Denied SI.  Denied physical problems.  Denied physical pain.  No goals.  No discharge plans. A:  Medications administered per MD orders.  Emotional support and encouragement given patient. R:  Safety maintained with 15 minute checks.  Denied SI and HI, contracts for safety.  Denied A/V hallucinations.

## 2023-07-15 NOTE — Plan of Care (Signed)
 Nurse discussed anxiety, depression and coping skills with patient.

## 2023-07-15 NOTE — Progress Notes (Signed)
 [facility] MD Progress Note  07/15/2023 12:57 PM Yolanda [name]  MRN:  [id number] Subjective:   48 year old African-American female, single, on SSI disability, homeless.  Background history of intellectual disability, substance use disorder and schizoaffective disorder depressive type.  Presented via emergency services on account of worsening depression associated with suicidal thoughts.  Expressed thoughts of suicide by stabbing herself.  Chart reviewed today.  Patient discussed at multidisciplinary team meeting.  Staff reports that she slept well last night.  No changes in her mental state.  No PRNs required lately.  Yolanda [name] was seen in her room during rounds.  She reported that her mood was good today.  She denies any new psychiatric or medical complaints.  She reports that her appetite is good.  She reports that focus and concentration are adequate.  She denies issues with energy.  She reports adequate sleep.  She denies any medication side effects.  She denies suicidal ideations, homicidal ideations, auditory hallucinations, visual hallucinations, or delusions.  The patient is not able to live independently due to intellectual disability, and is pending placement.   Principal Problem: Schizoaffective disorder, depressive type (HCC) Diagnosis: Principal Problem:   Schizoaffective disorder, depressive type (HCC) Active Problems:   GERD (gastroesophageal reflux disease)   Intellectual disability   Tobacco use disorder  Total Time spent with patient: 20 minutes  Past Psychiatric History: Extensive history of mental illness.  She has been tried on multiple medications over the years.  See H&P for full details.  Past Medical History:  Past Medical History:  Diagnosis Date   Bipolar affect, depressed (HCC)    Constipation 08/17/2022   Depression    Falls 07/21/2022   Fracture of femoral neck, right, closed (HCC) 01/17/2022   Herpes simplex 08/22/2017   Open fracture dislocation of right  elbow joint 01/17/2022    Past Surgical History:  Procedure Laterality Date   NO PAST SURGERIES     SALPINGECTOMY     Family History: History reviewed. No pertinent family history. Family Psychiatric  History: Extensive family history of mood disorder. Social History:  Social History   Substance and Sexual Activity  Alcohol Use Yes     Social History   Substance and Sexual Activity  Drug Use Yes   Types: Cocaine, Marijuana    Social History   Socioeconomic History   Marital status: Single    Spouse name: Not on file   Number of children: Not on file   Years of education: Not on file   Highest education level: Not on file  Occupational History   Not on file  Tobacco Use   Smoking status: Every Day   Smokeless tobacco: Not on file  Substance and Sexual Activity   Alcohol use: Yes   Drug use: Yes    Types: Cocaine, Marijuana   Sexual activity: Yes  Other Topics Concern   Not on file  Social History Narrative   Not on file   Social Drivers of Health   Financial Resource Strain: Low Risk  (09/12/2022)   Received from [facility], [facility]   Overall Financial Resource Strain (CARDIA)    Difficulty of Paying Living Expenses: Not hard at all  Food Insecurity: Patient Declined (12/20/2022)   Hunger Vital Sign    Worried About Running Out of Food in the Last Year: Patient declined    Ran Out of Food in the Last Year: Patient declined  Transportation Needs: No Transportation Needs (12/20/2022)   PRAPARE - Transportation  Lack of Transportation (Medical): No    Lack of Transportation (Non-Medical): No  Physical Activity: Not on file  Stress: No Stress Concern Present (07/17/2022)   Received from [facility], [facility] - Occupational Stress Questionnaire    Feeling of Stress : Not at all  Social Connections: Unknown (07/16/2022)   Received from [facility], [facility]   Social Network    Social Network: Not on  file     Current Medications: Current Facility-Administered Medications  Medication Dose Route Frequency Provider Last Rate Last Admin   acetaminophen  (TYLENOL ) tablet 650 mg  650 mg Oral Q6H PRN Trudy Carwin, NP   650 mg at 07/15/23 1118   alum & mag hydroxide-simeth (MAALOX/MYLANTA) 200-200-20 MG/5ML suspension 30 mL  30 mL Oral Q4H PRN Onuoha, Chinwendu V, NP   30 mL at 05/31/23 0801   atorvastatin  (LIPITOR) tablet 10 mg  10 mg Oral Daily Nwoko, Agnes I, NP   10 mg at 07/14/23 1811   busPIRone  (BUSPAR ) tablet 15 mg  15 mg Oral BID Nwoko, Agnes I, NP   15 mg at 07/15/23 9090   cyanocobalamin  (VITAMIN B12) injection 1,000 mcg  1,000 mcg Intramuscular Q30 days Evelena Figures, MD   1,000 mcg at 07/03/23 1009   diphenhydrAMINE  (BENADRYL ) capsule 50 mg  50 mg Oral TID PRN Trudy Carwin, NP   50 mg at 06/03/23 1110   Or   diphenhydrAMINE  (BENADRYL ) injection 50 mg  50 mg Intramuscular TID PRN Trudy Carwin, NP       docusate sodium  (COLACE) capsule 100 mg  100 mg Oral Daily Tex Drilling, NP   100 mg at 07/15/23 9092   haloperidol  (HALDOL ) tablet 5 mg  5 mg Oral TID PRN Collene Gouge I, NP   5 mg at 06/03/23 1110   Or   haloperidol  lactate (HALDOL ) injection 5 mg  5 mg Intramuscular TID PRN Collene Gouge I, NP       hydrocortisone  cream 1 %   Topical BID Ntuen, Tina C, FNP   Given at 07/02/23 1733   hydrOXYzine  (ATARAX ) tablet 25 mg  25 mg Oral TID PRN Nguyen, Julie, DO   25 mg at 07/07/23 9771   LORazepam  (ATIVAN ) tablet 2 mg  2 mg Oral TID PRN Trudy Carwin, NP   2 mg at 06/03/23 1110   Or   LORazepam  (ATIVAN ) injection 2 mg  2 mg Intramuscular TID PRN Trudy Carwin, NP       magnesium  hydroxide (MILK OF MAGNESIA) suspension 30 mL  30 mL Oral Daily PRN Trudy Carwin, NP   30 mL at 06/23/23 1856   melatonin tablet 5 mg  5 mg Oral QHS Nkwenti, Doris, NP   5 mg at 07/14/23 2052   nicotine  polacrilex (NICORETTE ) gum 2 mg  2 mg Oral PRN Goli, Veeraindar, MD   2 mg at 07/15/23 1121   paliperidone   (INVEGA ) 24 hr tablet 6 mg  6 mg Oral Daily Nkwenti, Doris, NP   6 mg at 07/14/23 2052   pantoprazole  (PROTONIX ) EC tablet 40 mg  40 mg Oral Daily Nwoko, Agnes I, NP   40 mg at 07/15/23 0905   polyethylene glycol (MIRALAX  / GLYCOLAX ) packet 17 g  17 g Oral Daily PRN Goli, Veeraindar, MD   17 g at 06/07/23 1847   selenium  sulfide (SELSUN ) 1 % shampoo   Topical Daily PRN Izediuno, Jerrell LABOR, MD       sertraline  (ZOLOFT ) tablet  150 mg  150 mg Oral Daily Massengill, Nathan, MD   150 mg at 07/15/23 9094   sulfamethoxazole -trimethoprim  (BACTRIM  DS) 800-160 MG per tablet 1 tablet  1 tablet Oral Q12H Tex Drilling, NP   1 tablet at 07/15/23 9094   SUMAtriptan  (IMITREX ) tablet 25 mg  25 mg Oral BID PRN Tex Drilling, NP   25 mg at 07/10/23 1642   traZODone  (DESYREL ) tablet 50 mg  50 mg Oral QHS Tex Drilling, NP   50 mg at 07/14/23 2052   Vitamin D  (Ergocalciferol ) (DRISDOL ) 1.25 MG (50000 UNIT) capsule 50,000 Units  50,000 Units Oral Q7 days Tex Drilling, NP   50,000 Units at 07/13/23 1731    Lab Results: No results found for this or any previous visit (from the past 48 hours).  Blood Alcohol level:  Lab Results  Component Value Date   ETH <10 12/19/2022   ETH <10 11/21/2019    Metabolic Disorder Labs: Lab Results  Component Value Date   HGBA1C 5.6 06/16/2023   MPG 114.02 06/16/2023   MPG 79.58 12/21/2022   No results found for: PROLACTIN Lab Results  Component Value Date   CHOL 256 (H) 06/16/2023   TRIG 224 (H) 06/16/2023   HDL 83 06/16/2023   CHOLHDL 3.1 06/16/2023   VLDL 45 (H) 06/16/2023   LDLCALC 128 (H) 06/16/2023   LDLCALC 149 (H) 12/21/2022    Physical Findings: AIMS: Facial and Oral Movements Muscles of Facial Expression: None Lips and Perioral Area: None Jaw: None Tongue: None,Extremity Movements Upper (arms, wrists, hands, fingers): None Lower (legs, knees, ankles, toes): None, Trunk Movements Neck, shoulders, hips: None, Global Judgements Severity of  abnormal movements overall : None Incapacitation due to abnormal movements: None Patient's awareness of abnormal movements: No Awareness, Dental Status Current problems with teeth and/or dentures?: No Does patient usually wear dentures?: No  CIWA:    COWS:     Musculoskeletal: Strength & Muscle Tone: within normal limits Gait & Station: normal Patient leans: N/A  Psychiatric Specialty Exam:  Presentation  General Appearance:  Casually dressed, overweight, not in any distress.  No EPS. Eye Contact: Good  Speech: Slurred and slow.  Speech Volume: At baseline.  Mood and Affect  Mood: Depressed but not pervasively so. Affect: Blunted and mood congruent.  Thought Process  Thought Processes: Decreased speed of thoughts but goal-directed.  Descriptions of Associations:  Linear.  Orientation:Full (Time, Place and Person)  Thought Content: Negative ruminations about early life events.  Negative rumination about being in the hospital during the holidays.  No active suicidal thoughts.  No homicidal thoughts.  No thoughts of violence.  No delusional preoccupation.   Hallucinations: No hallucination in any modality.   Sensorium  Memory: Immediate Fair; Recent Fair; Remote Good  Judgment: Fair  Insight: Good   Executive Functions  Concentration: Fair  Attention Span: Fair  Recall: Good  Fund of Knowledge: Fair  Language: Fair   Psychomotor Activity  Psychomotor Activity: No data recorded     Physical Exam: Physical Exam ROS Blood pressure 102/82, pulse 79, temperature 97.8 F (36.6 C), temperature source Oral, resp. rate 18, height 4' 11 (1.499 m), weight 63 kg, SpO2 98%. Body mass index is 28.05 kg/m.   Treatment Plan Summary: Patient is at her baseline.  She is tolerating her medications well.  There is no dangerousness.  She is stable for care as soon as we finalize safe an appropriate disposition.  We will continue her medications  and evaluate her further.  1.  Continue Invega  ER 6 mg at bedtime. 2.  Continue sertraline  150 mg daily. 3.  Continue trazodone  50 mg as needed for insomnia. 4.  Continue to monitor mood behavior and interaction with others. 5.  Continue to encourage unit groups and therapeutic activities.   Starleen GORMAN Kitty, MD 07/15/2023, 12:57 PM

## 2023-07-15 NOTE — Group Note (Signed)
 Recreation Therapy Group Note   Group Topic:Problem Solving  Group Date: 07/15/2023 Start Time: 0940 End Time: 1011 Facilitators: Yolanda [name], LRT,CTRS Location: [address] Dayroom   Group Topic: Communication, Team Building, Problem Solving  Goal Area(s) Addresses:  Patient will effectively work with peer towards shared goal.  Patient will identify skills used to make activity successful.  Patient will identify how skills used during activity can be applied to reach post d/c goals.   Intervention: STEM Activity- Glass Blower/designer  Group Description: Tallest Pharmacist, Community. In teams of 5-6, patients were given 11 craft pipe cleaners. Using the materials provided, patients were instructed to compete again the opposing team(s) to build the tallest free-standing structure from floor level. The activity was timed; difficulty increased by clinical research associate as production designer, theatre/television/film continued.  Systematically resources were removed with additional directions for example, placing one arm behind their back, working in silence, and shape stipulations. LRT facilitated post-activity discussion reviewing team processes and necessary communication skills involved in completion. Patients were encouraged to reflect how the skills utilized, or not utilized, in this activity can be incorporated to positively impact support systems post discharge.  Education: Pharmacist, Community, Scientist, Physiological, Discharge Planning   Education Outcome: Acknowledges education/In group clarification offered/Needs additional education.    Affect/Mood: N/A   Participation Level: Did not attend    Clinical Observations/Individualized Feedback:     Plan: Continue to engage patient in RT group sessions 2-3x/week.   Yolanda [name], LRT,CTRS 07/15/2023 12:41 PM

## 2023-07-15 NOTE — Progress Notes (Signed)
   07/15/23 2235  Psych Admission Type (Psych Patients Only)  Admission Status Involuntary  Psychosocial Assessment  Patient Complaints Anxiety  Eye Contact Fair  Facial Expression Animated  Affect Appropriate to circumstance  Speech Logical/coherent  Interaction Assertive  Motor Activity Tremors  Appearance/Hygiene In hospital gown  Behavior Characteristics Cooperative;Appropriate to situation  Mood Depressed  Thought Process  Coherency WDL  Content WDL  Delusions None reported or observed  Perception WDL  Hallucination None reported or observed  Judgment Impaired  Confusion None  Danger to Self  Current suicidal ideation? Denies  Self-Injurious Behavior No self-injurious ideation or behavior indicators observed or expressed   Agreement Not to Harm Self Yes  Description of Agreement verbal  Danger to Others  Danger to Others None reported or observed  Danger to Others Abnormal  Harmful Behavior to others No threats or harm toward other people  Destructive Behavior No threats or harm toward property

## 2023-07-15 NOTE — [facility] Group Notes (Signed)
 [facility] Group Notes:  (Nursing/MHT/Case Management/Adjunct)  Date:  07/15/2023  Time:  2000  Type of Therapy:   wrap up group  Participation Level:  Active  Participation Quality:  Appropriate and Attentive  Affect:  Appropriate  Cognitive:  Alert and Appropriate  Insight:  Appropriate  Engagement in Group:  Engaged  Modes of Intervention:  Discussion  Summary of Progress/Problems: Pt stated she rate her day 7. Pt stated she didn't have a goal set for today.  Yolanda [name] 07/15/2023, 9:10 PM

## 2023-07-15 NOTE — [facility] Group Notes (Signed)
 Adult Psychoeducational Group Note  Date:  07/15/2023 Time:  10:38 AM  Group Topic/Focus:  Goals Group:   The focus of this group is to help patients establish daily goals to achieve during treatment and discuss how the patient can incorporate goal setting into their daily lives to aide in recovery.  Participation Level:  Did Not Attend  Participation Quality:  na  Affect:  na  Cognitive:  na  Insight: na  Engagement in Group:  na  Modes of Intervention:  na  Additional Comments:   Did not attend group  Vincent Streater 07/15/2023, 10:38 AM

## 2023-07-16 DIAGNOSIS — F251 Schizoaffective disorder, depressive type: Secondary | ICD-10-CM | POA: Diagnosis not present

## 2023-07-16 LAB — URINALYSIS, ROUTINE W REFLEX MICROSCOPIC
Bacteria, UA: NONE SEEN
Bilirubin Urine: NEGATIVE
Glucose, UA: NEGATIVE mg/dL
Ketones, ur: NEGATIVE mg/dL
Nitrite: NEGATIVE
Protein, ur: NEGATIVE mg/dL
Specific Gravity, Urine: 1.019 (ref 1.005–1.030)
pH: 5 (ref 5.0–8.0)

## 2023-07-16 NOTE — Plan of Care (Signed)
   Problem: Safety: Goal: Periods of time without injury will increase Outcome: Progressing

## 2023-07-16 NOTE — Group Note (Signed)
 Date:  07/16/2023 Time:  10:22 AM  Group Topic/Focus:  Goals Group:   The focus of this group is to help patients establish daily goals to achieve during treatment and discuss how the patient can incorporate goal setting into their daily lives to aide in recovery. Orientation:   The focus of this group is to educate the patient on the purpose and policies of crisis stabilization and provide a format to answer questions about their admission.  The group details unit policies and expectations of patients while admitted.    Participation Level:  Did Not Attend   Yolanda [name] Mars 07/16/2023, 10:22 AM

## 2023-07-16 NOTE — Plan of Care (Signed)
  Problem: Coping: Goal: Ability to verbalize frustrations and anger appropriately will improve Outcome: Progressing   Problem: Education: Goal: Ability to make informed decisions regarding treatment will improve Outcome: Progressing   Problem: Coping: Goal: Coping ability will improve Outcome: Progressing   Problem: Medication: Goal: Compliance with prescribed medication regimen will improve Outcome: Progressing

## 2023-07-16 NOTE — Progress Notes (Signed)
 [facility] MD Progress Note  07/16/2023 2:01 PM Yolanda [name]  MRN:  [id number]  Principal Problem: Schizoaffective disorder, depressive type [facility])  HPI: This is the first psychiatric admission in this [facility] in 49 years for this AA female with an extensive hx of mental illnesses & probable polysubstance use disorders. She is admitted to the [facility] from the [facility] hospital with complain of worsening suicidal ideations with plan to stab herself. Per chart review, patient apparently reported at the ED that she has been depressed for a while & has not been taking her mental health medications. After medical evaluation.clearance, she was transferred to the [facility] for further psychiatric evaluation/treatments.   24 hr chart review: V/S WNL. Pt has remained compliant with scheduled medications, PRN med in the past 24 hrs consisted of Tylenol . No behavioral concerns in the past 24 hrs.   Patient assessment note: Pt with presents today with a depressed mood & affect is congruent. Her attention to personal hygiene and grooming is fair, eye contact is fair, speech is clear & coherent. Thought contents are organized and logical, and pt currently denies SI/HI/AVH or paranoia. There is no evidence of delusional thoughts.  She rates depression today as 2-3, 10 being worst, rates anxiety as 2, 10 being worst.  Pt reports, that she was able to sleep well last night, reports a good appetite, denies medication related side effects. Denies issues with bowel movements, and denies being in any physical pain today.   Psychiatric Symptoms have been stable for a wile now, and we are continuing to await for CSW to coordinate with DSS to place patient in an appropriate living arrangement outside of the hospital setting.   Labs reviewed: Pt was treated for a UTI a week ago. We are repeating her UA to see if infection has cleared. Continuing medications as listed below with no changes at this time.  Total Time spent with patient: 45  minutes  Past Psychiatric History: See H & P  Past Medical History:  Past Medical History:  Diagnosis Date   Bipolar affect, depressed (HCC)    Constipation 08/17/2022   Depression    Falls 07/21/2022   Fracture of femoral neck, right, closed (HCC) 01/17/2022   Herpes simplex 08/22/2017   Open fracture dislocation of right elbow joint 01/17/2022    Past Surgical History:  Procedure Laterality Date   NO PAST SURGERIES     SALPINGECTOMY     Family History: History reviewed. No pertinent family history. Family Psychiatric  History: See H & P Social History:  Social History   Substance and Sexual Activity  Alcohol Use Yes     Social History   Substance and Sexual Activity  Drug Use Yes   Types: Cocaine, Marijuana    Social History   Socioeconomic History   Marital status: Single    Spouse name: Not on file   Number of children: Not on file   Years of education: Not on file   Highest education level: Not on file  Occupational History   Not on file  Tobacco Use   Smoking status: Every Day   Smokeless tobacco: Not on file  Substance and Sexual Activity   Alcohol use: Yes   Drug use: Yes    Types: Cocaine, Marijuana   Sexual activity: Yes  Other Topics Concern   Not on file  Social History Narrative   Not on file   Social Drivers of Health   Financial Resource Strain: Low Risk  (  09/12/2022)   Received from [facility], [facility]   Overall Financial Resource Strain (CARDIA)    Difficulty of Paying Living Expenses: Not hard at all  Food Insecurity: Patient Declined (12/20/2022)   Hunger Vital Sign    Worried About Running Out of Food in the Last Year: Patient declined    Ran Out of Food in the Last Year: Patient declined  Transportation Needs: No Transportation Needs (12/20/2022)   PRAPARE - Administrator, Civil Service (Medical): No    Lack of Transportation (Non-Medical): No  Physical Activity: Not on file  Stress: No Stress Concern  Present (07/17/2022)   Received from [facility], [facility] - Occupational Stress Questionnaire    Feeling of Stress : Not at all  Social Connections: Unknown (07/16/2022)   Received from [facility], [facility]   Social Network    Social Network: Not on file   Sleep: Good  Appetite:  Good  Current Medications: Current Facility-Administered Medications  Medication Dose Route Frequency Provider Last Rate Last Admin   acetaminophen  (TYLENOL ) tablet 650 mg  650 mg Oral Q6H PRN Trudy Carwin, NP   650 mg at 07/15/23 1118   alum & mag hydroxide-simeth (MAALOX/MYLANTA) 200-200-20 MG/5ML suspension 30 mL  30 mL Oral Q4H PRN Onuoha, Chinwendu V, NP   30 mL at 05/31/23 0801   atorvastatin  (LIPITOR) tablet 10 mg  10 mg Oral Daily Nwoko, Agnes I, NP   10 mg at 07/15/23 1725   busPIRone  (BUSPAR ) tablet 15 mg  15 mg Oral BID Nwoko, Agnes I, NP   15 mg at 07/16/23 9097   cyanocobalamin  (VITAMIN B12) injection 1,000 mcg  1,000 mcg Intramuscular Q30 days Evelena Figures, MD   1,000 mcg at 07/03/23 1009   diphenhydrAMINE  (BENADRYL ) capsule 50 mg  50 mg Oral TID PRN Trudy Carwin, NP   50 mg at 06/03/23 1110   Or   diphenhydrAMINE  (BENADRYL ) injection 50 mg  50 mg Intramuscular TID PRN Trudy Carwin, NP       docusate sodium  (COLACE) capsule 100 mg  100 mg Oral Daily Ericia Moxley, NP   100 mg at 07/16/23 0902   haloperidol  (HALDOL ) tablet 5 mg  5 mg Oral TID PRN Collene Gouge I, NP   5 mg at 06/03/23 1110   Or   haloperidol  lactate (HALDOL ) injection 5 mg  5 mg Intramuscular TID PRN Collene Gouge I, NP       hydrocortisone  cream 1 %   Topical BID Ntuen, Tina C, FNP   Given at 07/02/23 1733   hydrOXYzine  (ATARAX ) tablet 25 mg  25 mg Oral TID PRN Nguyen, Julie, DO   25 mg at 07/07/23 0228   levothyroxine  (SYNTHROID ) tablet 25 mcg  25 mcg Oral Q0600 Parker, Alvin S, MD   25 mcg at 07/16/23 9392   LORazepam  (ATIVAN ) tablet 2 mg  2 mg Oral TID PRN Trudy Carwin, NP   2 mg at 06/03/23 1110   Or   LORazepam  (ATIVAN ) injection 2 mg  2 mg Intramuscular TID PRN Trudy Carwin, NP       magnesium  hydroxide (MILK OF MAGNESIA) suspension 30 mL  30 mL Oral Daily PRN Trudy Carwin, NP   30 mL at 06/23/23 1856   melatonin tablet 5 mg  5 mg Oral QHS Romelia Bromell, NP   5 mg at 07/15/23 2103   nicotine  polacrilex (NICORETTE ) gum 2 mg  2 mg Oral PRN  Goli, Veeraindar, MD   2 mg at 07/15/23 1121   paliperidone  (INVEGA ) 24 hr tablet 6 mg  6 mg Oral Daily Telesforo Brosnahan, NP   6 mg at 07/15/23 2103   pantoprazole  (PROTONIX ) EC tablet 40 mg  40 mg Oral Daily Nwoko, Agnes I, NP   40 mg at 07/16/23 0902   polyethylene glycol (MIRALAX  / GLYCOLAX ) packet 17 g  17 g Oral Daily PRN Goli, Veeraindar, MD   17 g at 06/07/23 1847   selenium  sulfide (SELSUN ) 1 % shampoo   Topical Daily PRN Izediuno, Jerrell LABOR, MD       sertraline  (ZOLOFT ) tablet 150 mg  150 mg Oral Daily Masseng[place], Nathan, MD   150 mg at 07/16/23 9097   SUMAtriptan  (IMITREX ) tablet 25 mg  25 mg Oral BID PRN Tex Drilling, NP   25 mg at 07/10/23 1642   traZODone  (DESYREL ) tablet 50 mg  50 mg Oral QHS Tex Drilling, NP   50 mg at 07/15/23 2103   Vitamin D  (Ergocalciferol ) (DRISDOL ) 1.25 MG (50000 UNIT) capsule 50,000 Units  50,000 Units Oral Q7 days Tex Drilling, NP   50,000 Units at 07/13/23 1731    Lab Results: No results found for this or any previous visit (from the past 48 hours).  Blood Alcohol level:  Lab Results  Component Value Date   ETH <10 12/19/2022   ETH <10 11/21/2019    Metabolic Disorder Labs: Lab Results  Component Value Date   HGBA1C 5.6 06/16/2023   MPG 114.02 06/16/2023   MPG 79.58 12/21/2022   No results found for: PROLACTIN Lab Results  Component Value Date   CHOL 256 (H) 06/16/2023   TRIG 224 (H) 06/16/2023   HDL 83 06/16/2023   CHOLHDL 3.1 06/16/2023   VLDL 45 (H) 06/16/2023   LDLCALC 128 (H) 06/16/2023   LDLCALC 149 (H) 12/21/2022    Physical Findings: AIMS:  Facial and Oral Movements Muscles of Facial Expression: None Lips and Perioral Area: None Jaw: None Tongue: None,Extremity Movements Upper (arms, wrists, hands, fingers): None Lower (legs, knees, ankles, toes): None, Trunk Movements Neck, shoulders, hips: None, Global Judgements Severity of abnormal movements overall : None Incapacitation due to abnormal movements: None Patient's awareness of abnormal movements: No Awareness, Dental Status Current problems with teeth and/or dentures?: No Does patient usually wear dentures?: No  CIWA:    COWS:     Musculoskeletal: Strength & Muscle Tone: within normal limits Gait & Station: normal Patient leans: N/A  Psychiatric Specialty Exam:  Presentation  General Appearance:  Fairly Groomed  Eye Contact: Fair  Speech: Clear and Coherent  Speech Volume: Normal  Handedness: Right   Mood and Affect  Mood: Depressed  Affect: Congruent   Thought Process  Thought Processes: Coherent  Descriptions of Associations:Intact  Orientation:Full (Time, Place and Person)  Thought Content:Logical  History of Schizophrenia/Schizoaffective disorder:Yes  Duration of Psychotic Symptoms:Greater than six months  Hallucinations:Hallucinations: None  Ideas of Reference:None  Suicidal Thoughts:Suicidal Thoughts: No  Homicidal Thoughts:Homicidal Thoughts: No   Sensorium  Memory: Immediate Fair  Judgment: Poor  Insight: Poor   Executive Functions  Concentration: Fair  Attention Span: Fair  Recall: Fair  Fund of Knowledge: Poor  Language: Fair   Psychomotor Activity  Psychomotor Activity:No data recorded  Assets  Assets: Resilience   Sleep  Sleep:Sleep: Good    Physical Exam: Physical Exam Constitutional:      Appearance: Normal appearance.  Musculoskeletal:        General: Normal range  of motion.     Cervical back: Normal range of motion.  Neurological:     General: No focal deficit  present.     Mental Status: She is alert and oriented to person, place, and time.    Review of Systems  Psychiatric/Behavioral:  Positive for depression. Negative for hallucinations, memory loss, substance abuse and suicidal ideas. The patient is not nervous/anxious and does not have insomnia.   All other systems reviewed and are negative.  Blood pressure 102/82, pulse 79, temperature 97.8 F (36.6 C), temperature source Oral, resp. rate 18, height 4' 11 (1.499 m), weight 63 kg, SpO2 98%. Body mass index is 28.05 kg/m.  Treatment Plan Summary: Daily contact with patient to assess and evaluate symptoms and progress in treatment and Medication management.   Continue inpatient hospitalization.  Will continue today 07/09/2023 plan as below except where it is noted.    Patient maintained her baseline. She is tolerating her medications well. There is no dangerousness being displayed on the uint. She is stable for care as soon as we finalize safe and appropriate disposition. We will continue her medications and evaluate her further.    Principal/active diagnoses:  Schizoaffective disorder, depressive type GERD (gastroesophageal reflux disease) Intellectual disability Tobacco use disorder  Plan: The risks/benefits/side-effects/alternatives to the medications in use were discussed in detail with the patient and time was given for patient's questions. The patient consents to medication trial.    1.  Continue Invega  ER 6 mg at bedtime for mood control. 2.  Continue sertraline  150 mg daily for depression.. 3.  Continue trazodone  50 mg as needed for insomnia.  4.  Continue Buspar  15 mg po bid for anxiety.  5.  Continue Melatonin 5 mg po Q hs for insomnia. 6.  Continue to monitor mood behavior and interaction with others. 7.  Continue to encourage unit groups and therapeutic activities.  -Bactrim -DS 1 tab BID x 7 days for UTI completed-ordering repeat UA   Other medical issues:  -Continue   Lipitor 10 mg po q evenings for hyperlipidemia.  -Continue Colace 100 mg po daily for constipation.  -Continue Vit. B-12 po daily for B-12 deficiency.  -Continue Miralax  17 gm po Q daily/prn for constipation.  -Continue Protonix  40 mg po Q am for acid reflux.  -Continue Vit. D 50,000 units pr daily for Vit. D deficiency.  -Continue Imitrex  25 mg po bid prn for migraine headache.    Continue the agitation protocols as r per the MAR-Please see MAR for details   Other PRNS -Continue Tylenol  650 mg every 6 hours PRN for mild pain -Continue Maalox 30 ml Q 4 hrs PRN for indigestion -Continue MOM 30 ml po Q 6 hrs for constipation   Safety and Monitoring: Voluntary admission to inpatient psychiatric unit for safety, stabilization and treatment Daily contact with patient to assess and evaluate symptoms and progress in treatment Patient's case to be discussed in multi-disciplinary team meeting Observation Level : q15 minute checks Vital signs: q12 hours Precautions: Safety   Discharge Planning: Social work and case management to assist with discharge planning and identification of hospital follow-up needs prior to discharge Estimated LOS: 5-7 days Discharge Concerns: Need to establish a safety plan; Medication compliance and effectiveness Discharge Goals: Return home with outpatient referrals for mental health follow-up including medication management/psychotherapy   Donia Snell, NP 07/16/2023, 2:01 PM

## 2023-07-16 NOTE — Progress Notes (Signed)
   07/16/23 0902  Psych Admission Type (Psych Patients Only)  Admission Status Involuntary  Psychosocial Assessment  Patient Complaints Anxiety  Eye Contact Fair  Facial Expression Animated  Affect Appropriate to circumstance  Speech Logical/coherent  Interaction Assertive  Motor Activity Tremors  Appearance/Hygiene Disheveled  Behavior Characteristics Cooperative;Appropriate to situation  Mood Depressed  Thought Process  Coherency WDL  Content WDL  Delusions None reported or observed  Perception WDL  Hallucination None reported or observed  Judgment Impaired  Confusion None  Danger to Self  Current suicidal ideation? Denies  Self-Injurious Behavior No self-injurious ideation or behavior indicators observed or expressed   Agreement Not to Harm Self Yes  Description of Agreement verbal  Danger to Others  Danger to Others None reported or observed  Danger to Others Abnormal  Harmful Behavior to others No threats or harm toward other people  Destructive Behavior No threats or harm toward property

## 2023-07-16 NOTE — Group Note (Signed)
 LCSW Group Therapy Note   Group Date: 07/16/2023 Start Time: 1100 End Time: 1200   Type of Therapy and Topic:  Group Therapy  Participation:  Patient was present and actively listened, but didn't participate in the discussion.     Topic:  Effective Communication Skills  Goal of the Session: The goal of today's group session was to improve communication skills among participants, helping them express themselves more clearly, listen actively, and better understand others. This will contribute to healthier relationships and more effective problem-solving within the group and in daily life.  Objectives of the Session: To enhance active listening skills by engaging participants in exercises that improve their ability to focus on and understand others' points of view. To practice the use of I statements to help participants express their thoughts and feelings without sounding accusatory or confrontational. To discuss the importance of body language and other non-verbal communication cues and how they impact conversations. To help participants recognize the role of emotions in communication and develop strategies for managing emotions during conversations.  Summary:   In today's group session on communication, we began by discussing the importance of effective communication. We reviewed the different types of communication.  We then introduced the concept of active listening and "I" statements and practiced new communication skills using examples.  Participants learned how emotions can influence conversations and practiced techniques for managing strong emotions during discussions.  Participants were encouraged to reflect on one thing they would like to improve in their own communication and practice that in the coming week.  Therapeutic Modalities:  Elements of DBT   Catherene MALVA Dynes, LCSWA 07/16/2023  12:32 PM

## 2023-07-16 NOTE — Progress Notes (Signed)
   07/16/23 2104  Psych Admission Type (Psych Patients Only)  Admission Status Involuntary  Psychosocial Assessment  Patient Complaints Depression  Eye Contact Fair  Facial Expression Sad  Affect Depressed  Speech Logical/coherent  Interaction Assertive  Motor Activity Tremors  Appearance/Hygiene Disheveled  Behavior Characteristics Cooperative  Mood Depressed;Pleasant  Thought Process  Coherency WDL  Content WDL  Delusions None reported or observed  Perception WDL  Hallucination None reported or observed  Judgment Impaired  Confusion None  Danger to Self  Current suicidal ideation? Denies  Self-Injurious Behavior No self-injurious ideation or behavior indicators observed or expressed   Agreement Not to Harm Self Yes  Description of Agreement verbal  Danger to Others  Danger to Others None reported or observed  Danger to Others Abnormal  Harmful Behavior to others No threats or harm toward other people  Destructive Behavior No threats or harm toward property

## 2023-07-16 NOTE — BHH Group Notes (Signed)

## 2023-07-16 NOTE — Discharge Planning (Signed)
 Meeting held on today with patient, [facility] Supervisor Massie Silvius, Behavioral Health MD [place], DSS Adult Protective Services Representative Arch Ada, and Donia Paterson, LCSW at [facility] regarding patient disposition. Update was provided regarding current status of Group Home [facility]. Group Home is still awaiting update from [facility] regarding approval of application. Once an update can be provided then team will be made aware. [name] with [facility] reports she would also like to ensure that the patient is set up with Peer Support Services upon discharge and has requested for DSS to follow up with her to sign documents for those services to be implemented. Updates have been provided to DSS. No other needs were reported by Group Home. Patient explored barriers regarding smoking, visiting with family and friends, and being able to decorate her own room. Brief supportive counseling was provided to the patient regarding previous conversation about utilizing other positive coping strategies that are less harmful. Patient was reminded of her previous list such as reading the bible and coloring. Patient expressed understanding and is aware that team will follow up regarding her questions and will provide updates as received. Team will continue to meet on a weekly basis regarding disposition updates. No other needs to report at this time.   Yolanda Lazier, LCSW Clinical Social Worker [place] Ph: [phone]

## 2023-07-16 NOTE — Discharge Planning (Signed)
 LCSW followed up with Group Home Representative Adrien Essex at [facility] in [place] on yesterday 01/06 to inquire about updates. Per [name], she has heard back from the [facility] department on yesterday, and they have informed her that they are reviewing her rate request and will follow up with updates as able. Per [name], once she is provided an update she will follow up with LCSW to inform. No other needs were reported at this time.   LCSW has provided an update to [employer] at [facility], [facility] Supervisor Massie Silvius, DSS Representative Arch Ada, and Care Manager Marthe Pouch.     Merlynn Lazier, LCSW Clinical Social Worker [facility] Ph: [phone]

## 2023-07-16 NOTE — [facility] Group Notes (Signed)
Patient did not attend the Wrap-up group. 

## 2023-07-17 ENCOUNTER — Encounter (HOSPITAL_COMMUNITY): Payer: Self-pay

## 2023-07-17 DIAGNOSIS — F251 Schizoaffective disorder, depressive type: Secondary | ICD-10-CM

## 2023-07-17 NOTE — Progress Notes (Signed)
   07/17/23 0857  Psych Admission Type (Psych Patients Only)  Admission Status Involuntary  Psychosocial Assessment  Patient Complaints Depression;Anxiety  Eye Contact Fair  Facial Expression Sad;Anxious  Affect Depressed;Anxious  Speech Logical/coherent  Interaction Assertive  Motor Activity Tremors  Appearance/Hygiene Designer, Industrial/product Cooperative;Anxious  Mood Depressed;Pleasant  Thought Process  Coherency WDL  Content WDL  Delusions None reported or observed  Perception WDL  Hallucination None reported or observed  Judgment Impaired  Confusion None  Danger to Self  Current suicidal ideation? Denies  Description of Suicide Plan No plaN  Self-Injurious Behavior No self-injurious ideation or behavior indicators observed or expressed   Agreement Not to Harm Self Yes  Description of Agreement Verbal  Danger to Others  Danger to Others None reported or observed  Danger to Others Abnormal  Harmful Behavior to others No threats or harm toward other people  Destructive Behavior No threats or harm toward property

## 2023-07-17 NOTE — Group Note (Signed)
 Date:  07/17/2023 Time:  2:58 PM  Group Topic/Focus:  Identifying Needs:   The focus of this group is to help patients identify their personal needs that have been historically problematic and identify healthy behaviors to address their needs.    Participation Level:  Active  Participation Quality:  Appropriate  Affect:  Appropriate  Cognitive:  Appropriate  Insight: Appropriate  Engagement in Group:  Engaged  Modes of Intervention:  Education  Additional Comments:     Yolanda [name] [name] 07/17/2023, 2:58 PM

## 2023-07-17 NOTE — Group Note (Signed)
 Date:  07/17/2023 Time:  9:31 PM  Group Topic/Focus:  Wrap-Up Group:   The focus of this group is to help patients review their daily goal of treatment and discuss progress on daily workbooks.    Participation Level:  Did Not Attend  Participation Quality:   N/A  Affect:   N/A  Cognitive:   N/A  Insight: None  Engagement in Group:   N/A  Modes of Intervention:   N/A  Additional Comments:  Patient did not attend wrap up group.   Eward Mace 07/17/2023, 9:31 PM

## 2023-07-17 NOTE — Plan of Care (Signed)
   Problem: Coping: Goal: Coping ability will improve Outcome: Progressing

## 2023-07-17 NOTE — Plan of Care (Signed)
   Problem: Education: Goal: Emotional status will improve Outcome: Progressing Goal: Mental status will improve Outcome: Progressing

## 2023-07-17 NOTE — Progress Notes (Signed)
 [facility] MD Progress Note  07/17/2023 2:52 PM Yolanda [name]  MRN:  [id number] Subjective:   48 year old African-American female, single, on SSI disability, homeless.  Background history of intellectual disability, substance use disorder and schizoaffective disorder depressive type.  Presented via emergency services on account of worsening depression associated with suicidal thoughts.  Expressed thoughts of suicide by stabbing herself.  Chart reviewed today.  Patient discussed at multidisciplinary team meeting.  Staff reports that she slept well last night.  No changes in her mental state.  No PRNs required lately.  Daily notes: Yolanda [name] is seen. Chart reviewed. Patient was discussed with  her treatment team during the morning meeting today. She presents tearful. She reports, I have been in this hospital for almost 8 months now. I'm wondering why I cannot get into any group home. My mood is okay, I just feel sad & disappointed because I keep waiting & waiting to hear about my placement into a group home. I'm so ready to get out of this hospital. Yolanda [name] although tearful, currently denies any SIHI, AVH, delusional thoughts or paranoia. She does not appear to be responding to any internal stimuli. There are no changes made on her current plan of care. She denies any side effects. Will continue as already in progress. Vital signs remain stable.  Principal Problem: Schizoaffective disorder, depressive type (HCC) Diagnosis: Principal Problem:   Schizoaffective disorder, depressive type (HCC) Active Problems:   GERD (gastroesophageal reflux disease)   Intellectual disability   Tobacco use disorder  Total Time spent with patient: 20 minutes  Past Psychiatric History: Extensive history of mental illness.  She has been tried on multiple medications over the years.  See H&P for full details.  Past Medical History:  Past Medical History:  Diagnosis Date   Bipolar affect, depressed (HCC)     Constipation 08/17/2022   Depression    Falls 07/21/2022   Fracture of femoral neck, right, closed (HCC) 01/17/2022   Herpes simplex 08/22/2017   Open fracture dislocation of right elbow joint 01/17/2022    Past Surgical History:  Procedure Laterality Date   NO PAST SURGERIES     SALPINGECTOMY     Family History: History reviewed. No pertinent family history. Family Psychiatric  History: Extensive family history of mood disorder. Social History:  Social History   Substance and Sexual Activity  Alcohol Use Yes     Social History   Substance and Sexual Activity  Drug Use Yes   Types: Cocaine, Marijuana    Social History   Socioeconomic History   Marital status: Single    Spouse name: Not on file   Number of children: Not on file   Years of education: Not on file   Highest education level: Not on file  Occupational History   Not on file  Tobacco Use   Smoking status: Every Day   Smokeless tobacco: Not on file  Substance and Sexual Activity   Alcohol use: Yes   Drug use: Yes    Types: Cocaine, Marijuana   Sexual activity: Yes  Other Topics Concern   Not on file  Social History Narrative   Not on file   Social Drivers of Health   Financial Resource Strain: Low Risk  (09/12/2022)   Received from [facility], [facility]   Overall Financial Resource Strain (CARDIA)    Difficulty of Paying Living Expenses: Not hard at all  Food Insecurity: Patient Declined (12/20/2022)   Hunger Vital Sign    Worried About Running  Out of Food in the Last Year: Patient declined    Ran Out of Food in the Last Year: Patient declined  Transportation Needs: No Transportation Needs (12/20/2022)   PRAPARE - Administrator, Civil Service (Medical): No    Lack of Transportation (Non-Medical): No  Physical Activity: Not on file  Stress: No Stress Concern Present (07/17/2022)   Received from [facility] Occupational Health - Occupational  Stress Questionnaire    Feeling of Stress : Not at all  Social Connections: Unknown (07/16/2022)   Received from [facility], [facility]   Social Network    Social Network: Not on file    Current Medications: Current Facility-Administered Medications  Medication Dose Route Frequency Provider Last Rate Last Admin   acetaminophen  (TYLENOL ) tablet 650 mg  650 mg Oral Q6H PRN Trudy Carwin, NP   650 mg at 07/16/23 1634   alum & mag hydroxide-simeth (MAALOX/MYLANTA) 200-200-20 MG/5ML suspension 30 mL  30 mL Oral Q4H PRN Onuoha, Chinwendu V, NP   30 mL at 05/31/23 0801   atorvastatin  (LIPITOR) tablet 10 mg  10 mg Oral Daily Chaunice Obie I, NP   10 mg at 07/16/23 1700   busPIRone  (BUSPAR ) tablet 15 mg  15 mg Oral BID Azriella Mattia I, NP   15 mg at 07/17/23 0736   cyanocobalamin  (VITAMIN B12) injection 1,000 mcg  1,000 mcg Intramuscular Q30 days Evelena Figures, MD   1,000 mcg at 07/03/23 1009   diphenhydrAMINE  (BENADRYL ) capsule 50 mg  50 mg Oral TID PRN Trudy Carwin, NP   50 mg at 06/03/23 1110   Or   diphenhydrAMINE  (BENADRYL ) injection 50 mg  50 mg Intramuscular TID PRN Trudy Carwin, NP       docusate sodium  (COLACE) capsule 100 mg  100 mg Oral Daily Nkwenti, Doris, NP   100 mg at 07/17/23 9263   haloperidol  (HALDOL ) tablet 5 mg  5 mg Oral TID PRN Collene Gouge I, NP   5 mg at 06/03/23 1110   Or   haloperidol  lactate (HALDOL ) injection 5 mg  5 mg Intramuscular TID PRN Collene Gouge I, NP       hydrocortisone  cream 1 %   Topical BID Ntuen, Tina C, FNP   Given at 07/02/23 1733   hydrOXYzine  (ATARAX ) tablet 25 mg  25 mg Oral TID PRN Nguyen, Julie, DO   25 mg at 07/07/23 0228   levothyroxine  (SYNTHROID ) tablet 25 mcg  25 mcg Oral Q0600 Parker, Alvin S, MD   25 mcg at 07/17/23 9353   LORazepam  (ATIVAN ) tablet 2 mg  2 mg Oral TID PRN Trudy Carwin, NP   2 mg at 06/03/23 1110   Or   LORazepam  (ATIVAN ) injection 2 mg  2 mg Intramuscular TID PRN Trudy Carwin, NP       magnesium  hydroxide (MILK OF  MAGNESIA) suspension 30 mL  30 mL Oral Daily PRN Trudy Carwin, NP   30 mL at 06/23/23 1856   melatonin tablet 5 mg  5 mg Oral QHS Nkwenti, Doris, NP   5 mg at 07/16/23 2104   nicotine  polacrilex (NICORETTE ) gum 2 mg  2 mg Oral PRN Goli, Veeraindar, MD   2 mg at 07/16/23 1634   paliperidone  (INVEGA ) 24 hr tablet 6 mg  6 mg Oral Daily Nkwenti, Doris, NP   6 mg at 07/16/23 2104   pantoprazole  (PROTONIX ) EC tablet 40 mg  40 mg Oral Daily Moataz Tavis I, NP  40 mg at 07/17/23 0736   polyethylene glycol (MIRALAX  / GLYCOLAX ) packet 17 g  17 g Oral Daily PRN Goli, Veeraindar, MD   17 g at 06/07/23 1847   selenium  sulfide (SELSUN ) 1 % shampoo   Topical Daily PRN Izediuno, Jerrell LABOR, MD       sertraline  (ZOLOFT ) tablet 150 mg  150 mg Oral Daily Massengill, Rankin, MD   150 mg at 07/17/23 9263   SUMAtriptan  (IMITREX ) tablet 25 mg  25 mg Oral BID PRN Tex Drilling, NP   25 mg at 07/10/23 1642   traZODone  (DESYREL ) tablet 50 mg  50 mg Oral QHS Tex Drilling, NP   50 mg at 07/16/23 2104   Vitamin D  (Ergocalciferol ) (DRISDOL ) 1.25 MG (50000 UNIT) capsule 50,000 Units  50,000 Units Oral Q7 days Tex Drilling, NP   50,000 Units at 07/13/23 1731   Lab Results:  Results for orders placed or performed during the hospital encounter of 12/20/22 (from the past 48 hours)  Urinalysis, Routine w reflex microscopic -Urine, Clean Catch     Status: Abnormal   Collection Time: 07/16/23  2:23 PM  Result Value Ref Range   Color, Urine YELLOW YELLOW   APPearance CLEAR CLEAR   Specific Gravity, Urine 1.019 1.005 - 1.030   pH 5.0 5.0 - 8.0   Glucose, UA NEGATIVE NEGATIVE mg/dL   Hgb urine dipstick MODERATE (A) NEGATIVE   Bilirubin Urine NEGATIVE NEGATIVE   Ketones, ur NEGATIVE NEGATIVE mg/dL   Protein, ur NEGATIVE NEGATIVE mg/dL   Nitrite NEGATIVE NEGATIVE   Leukocytes,Ua SMALL (A) NEGATIVE   RBC / HPF 0-5 0 - 5 RBC/hpf   WBC, UA 6-10 0 - 5 WBC/hpf   Bacteria, UA NONE SEEN NONE SEEN   Squamous Epithelial / HPF 0-5  0 - 5 /HPF   Mucus PRESENT     Comment: Performed at [facility], [address] [place], [place] [address]   Blood Alcohol level:  Lab Results  Component Value Date   ETH <10 12/19/2022   ETH <10 11/21/2019   Metabolic Disorder Labs: Lab Results  Component Value Date   HGBA1C 5.6 06/16/2023   MPG 114.02 06/16/2023   MPG 79.58 12/21/2022   No results found for: PROLACTIN Lab Results  Component Value Date   CHOL 256 (H) 06/16/2023   TRIG 224 (H) 06/16/2023   HDL 83 06/16/2023   CHOLHDL 3.1 06/16/2023   VLDL 45 (H) 06/16/2023   LDLCALC 128 (H) 06/16/2023   LDLCALC 149 (H) 12/21/2022   Physical Findings: AIMS: Facial and Oral Movements Muscles of Facial Expression: None Lips and Perioral Area: None Jaw: None Tongue: None,Extremity Movements Upper (arms, wrists, hands, fingers): None Lower (legs, knees, ankles, toes): None, Trunk Movements Neck, shoulders, hips: None, Global Judgements Severity of abnormal movements overall : None Incapacitation due to abnormal movements: None Patient's awareness of abnormal movements: No Awareness, Dental Status Current problems with teeth and/or dentures?: No Does patient usually wear dentures?: No  CIWA:    COWS:     Musculoskeletal: Strength & Muscle Tone: within normal limits Gait & Station: normal Patient leans: N/A  Psychiatric Specialty Exam:  Presentation  General Appearance:  Casually dressed, overweight, not in any distress.  No EPS. Eye Contact: Fair  Speech: Slurred and slow.  Speech Volume: At baseline.  Mood and Affect  Mood: Depressed but not pervasively so. Affect: Blunted and mood congruent.  Thought Process  Thought Processes: Decreased speed of thoughts but goal-directed.  Descriptions  of Associations:  Linear.  Orientation:Full (Time, Place and Person)  Thought Content: Negative ruminations about early life events.  Negative rumination about being in the hospital  during the holidays.  No active suicidal thoughts.  No homicidal thoughts.  No thoughts of violence.  No delusional preoccupation.   Hallucinations: No hallucination in any modality.   Sensorium  Memory: Immediate Fair  Judgment: Fair  Insight: Fair   Chartered Certified Accountant: Fair  Attention Span: Fair  Recall: [employer] of Knowledge: Fair  Language: Fair   Psychomotor Activity  Psychomotor Activity: Psychomotor Activity: Decreased  Physical Exam: Physical Exam HENT:     Nose: Nose normal.     Mouth/Throat:     Pharynx: Oropharynx is clear.  Cardiovascular:     Rate and Rhythm: Normal rate.     Pulses: Normal pulses.  Pulmonary:     Effort: Pulmonary effort is normal.  Genitourinary:    Comments: Deferred Musculoskeletal:     Cervical back: Normal range of motion.  Skin:    General: Skin is warm and dry.  Neurological:     General: No focal deficit present.     Mental Status: She is alert and oriented to person, place, and time.    Review of Systems  Constitutional:  Negative for chills, diaphoresis and fever.  HENT:  Negative for congestion and sore throat.   Respiratory:  Negative for cough, shortness of breath and wheezing.   Cardiovascular:  Negative for chest pain and palpitations.  Gastrointestinal:  Negative for abdominal pain, diarrhea, heartburn, nausea and vomiting.  Musculoskeletal:  Negative for joint pain and myalgias.  Neurological:  Negative for dizziness, tingling, tremors, sensory change, speech change, focal weakness, seizures, loss of consciousness, weakness and headaches.  Psychiatric/Behavioral:  Negative for hallucinations, memory loss, substance abuse and suicidal ideas. The patient is not nervous/anxious and does not have insomnia.    Blood pressure 107/71, pulse (!) 105, temperature 97.8 F (36.6 C), temperature source Oral, resp. rate 16, height 4' 11 (1.499 m), weight 63 kg, SpO2 98%. Body mass index is  28.05 kg/m.  Treatment Plan Summary: Patient is at her baseline.  She is tolerating her medications well.  There is no dangerousness.  She is stable for care as soon as we finalize safe an appropriate disposition.  We will continue her medications and evaluate her further.  1.  Continue Invega  ER 6 mg at bedtime. 2.  Continue sertraline  150 mg daily. 3.  Continue trazodone  50 mg as needed for insomnia. 4.  Continue Buspar  15 mg po bid for anxiety. 5.  Continue Hydroxyzine  25 mg po tid prn for anxiety. 6.  Continue Melatonin 5 mg po q bedtime for sleep. 7.  Continue Miralax  17 gm po daily prn for constipation. 8.  Continue Vitamin D1.25 (50,000 units daily for bone health. 5.  Continue to monitor mood behavior and interaction with others. 5.  Continue to encourage unit groups and therapeutic activities.  Mac Bolster, NP, pmhnp, fnp-bc. 07/17/2023, 2:52 PM Patient ID: Yolanda [name], female   DOB: 07-09-1976, 48 y.o.   MRN: [id number]

## 2023-07-17 NOTE — Progress Notes (Signed)
   07/17/23 0865  15 Minute Checks  Location Bedroom  Visual Appearance Calm  Behavior Sleeping  Sleep (Behavioral Health Patients Only)  Calculate sleep? (Click Yes once per 24 hr at 0600 safety check) Yes  Documented sleep last 24 hours 5.75

## 2023-07-17 NOTE — Group Note (Signed)
 Recreation Therapy Group Note   Group Topic:Stress Management  Group Date: 07/17/2023 Start Time: 0935 End Time: 1005 Facilitators: Wendelyn Kiesling-McCall, LRT,CTRS Location: [address] Dayroom   Group Topic: Stress Management   Goal Area(s) Addresses:  Patient will actively participate in stress management techniques presented during session.  Patient will successfully identify benefit of practicing stress management post d/c.   Intervention: Relaxation exercise with ambient sound and script   Group Description: Guided Imagery. LRT provided education, instruction, and demonstration on practice of visualization via guided imagery. Patient was asked to participate in the technique introduced during session. LRT debriefed including topics of mindfulness, stress management and specific scenarios each patient could use these techniques. Patients were given suggestions of ways to access scripts post d/c and encouraged to explore Youtube and other apps available on smartphones, tablets, and computers.  Education:  Stress Management, Discharge Planning.    Affect/Mood: N/A   Participation Level: Did not attend    Clinical Observations/Individualized Feedback:      Plan: Continue to engage patient in RT group sessions 2-3x/week.   Tanaisha Pittman-McCall, LRT,CTRS  07/17/2023 12:33 PM

## 2023-07-17 NOTE — BH IP Treatment Plan (Signed)
 Interdisciplinary Treatment and Diagnostic Plan Update  07/17/2023 Time of Session: 11:05 AM - UPDATE MAR ZETTLER MRN: [id number]  Principal Diagnosis: Schizoaffective disorder, depressive type (HCC)  Secondary Diagnoses: Principal Problem:   Schizoaffective disorder, depressive type (HCC) Active Problems:   GERD (gastroesophageal reflux disease)   Intellectual disability   Tobacco use disorder   Current Medications:  Current Facility-Administered Medications  Medication Dose Route Frequency Provider Last Rate Last Admin   acetaminophen  (TYLENOL ) tablet 650 mg  650 mg Oral Q6H PRN Trudy Carwin, NP   650 mg at 07/16/23 1634   alum & mag hydroxide-simeth (MAALOX/MYLANTA) 200-200-20 MG/5ML suspension 30 mL  30 mL Oral Q4H PRN Onuoha, Chinwendu V, NP   30 mL at 05/31/23 0801   atorvastatin  (LIPITOR) tablet 10 mg  10 mg Oral Daily Nwoko, Agnes I, NP   10 mg at 07/16/23 1700   busPIRone  (BUSPAR ) tablet 15 mg  15 mg Oral BID Nwoko, Agnes I, NP   15 mg at 07/17/23 0736   cyanocobalamin  (VITAMIN B12) injection 1,000 mcg  1,000 mcg Intramuscular Q30 days Evelena Figures, MD   1,000 mcg at 07/03/23 1009   diphenhydrAMINE  (BENADRYL ) capsule 50 mg  50 mg Oral TID PRN Trudy Carwin, NP   50 mg at 06/03/23 1110   Or   diphenhydrAMINE  (BENADRYL ) injection 50 mg  50 mg Intramuscular TID PRN Trudy Carwin, NP       docusate sodium  (COLACE) capsule 100 mg  100 mg Oral Daily Tex Drilling, NP   100 mg at 07/17/23 0736   haloperidol  (HALDOL ) tablet 5 mg  5 mg Oral TID PRN Collene Gouge I, NP   5 mg at 06/03/23 1110   Or   haloperidol  lactate (HALDOL ) injection 5 mg  5 mg Intramuscular TID PRN Collene Gouge I, NP       hydrocortisone  cream 1 %   Topical BID Ntuen, Tina C, FNP   Given at 07/02/23 1733   hydrOXYzine  (ATARAX ) tablet 25 mg  25 mg Oral TID PRN Nguyen, Julie, DO   25 mg at 07/07/23 0228   levothyroxine  (SYNTHROID ) tablet 25 mcg  25 mcg Oral Q0600 Parker, Alvin S, MD   25 mcg at 07/17/23 9353    LORazepam  (ATIVAN ) tablet 2 mg  2 mg Oral TID PRN Trudy Carwin, NP   2 mg at 06/03/23 1110   Or   LORazepam  (ATIVAN ) injection 2 mg  2 mg Intramuscular TID PRN Trudy Carwin, NP       magnesium  hydroxide (MILK OF MAGNESIA) suspension 30 mL  30 mL Oral Daily PRN Trudy Carwin, NP   30 mL at 06/23/23 1856   melatonin tablet 5 mg  5 mg Oral QHS Nkwenti, Doris, NP   5 mg at 07/16/23 2104   nicotine  polacrilex (NICORETTE ) gum 2 mg  2 mg Oral PRN Goli, Veeraindar, MD   2 mg at 07/16/23 1634   paliperidone  (INVEGA ) 24 hr tablet 6 mg  6 mg Oral Daily Nkwenti, Doris, NP   6 mg at 07/16/23 2104   pantoprazole  (PROTONIX ) EC tablet 40 mg  40 mg Oral Daily Nwoko, Agnes I, NP   40 mg at 07/17/23 0736   polyethylene glycol (MIRALAX  / GLYCOLAX ) packet 17 g  17 g Oral Daily PRN Goli, Veeraindar, MD   17 g at 06/07/23 1847   selenium  sulfide (SELSUN ) 1 % shampoo   Topical Daily PRN Izediuno, Jerrell LABOR, MD       sertraline  (ZOLOFT ) tablet 150 mg  150 mg Oral Daily Massengill, Rankin, MD   150 mg at 07/17/23 9263   SUMAtriptan  (IMITREX ) tablet 25 mg  25 mg Oral BID PRN Tex Drilling, NP   25 mg at 07/10/23 1642   traZODone  (DESYREL ) tablet 50 mg  50 mg Oral QHS Tex Drilling, NP   50 mg at 07/16/23 2104   Vitamin D  (Ergocalciferol ) (DRISDOL ) 1.25 MG (50000 UNIT) capsule 50,000 Units  50,000 Units Oral Q7 days Tex Drilling, NP   50,000 Units at 07/13/23 1731   PTA Medications: Medications Prior to Admission  Medication Sig Dispense Refill Last Dose/Taking   busPIRone  (BUSPAR ) 15 MG tablet Take 15 mg by mouth 2 (two) times daily. (Patient not taking: Reported on 12/19/2022)      paliperidone  (INVEGA  SUSTENNA) 156 MG/ML SUSY injection Inject 156 mg into the muscle once. (Patient not taking: Reported on 12/19/2022)      sertraline  (ZOLOFT ) 50 MG tablet Take 150 mg by mouth daily. (Patient not taking: Reported on 12/19/2022)      traZODone  (DESYREL ) 100 MG tablet Take 100 mg by mouth at bedtime as needed for sleep.  (Patient not taking: Reported on 11/12/2022)       Patient Stressors: Medication change or noncompliance    Patient Strengths: Forensic Psychologist fund of knowledge   Treatment Modalities: Medication Management, Group therapy, Case management,  1 to 1 session with clinician, Psychoeducation, Recreational therapy.   Physician Treatment Plan for Primary Diagnosis: Schizoaffective disorder, depressive type (HCC) Long Term Goal(s): Improvement in symptoms so as ready for discharge   Short Term Goals: Ability to identify changes in lifestyle to reduce recurrence of condition will improve Ability to verbalize feelings will improve  Medication Management: Evaluate patient's response, side effects, and tolerance of medication regimen.  Therapeutic Interventions: 1 to 1 sessions, Unit Group sessions and Medication administration.  Evaluation of Outcomes: Progressing  Physician Treatment Plan for Secondary Diagnosis: Principal Problem:   Schizoaffective disorder, depressive type (HCC) Active Problems:   GERD (gastroesophageal reflux disease)   Intellectual disability   Tobacco use disorder  Long Term Goal(s): Improvement in symptoms so as ready for discharge   Short Term Goals: Ability to identify changes in lifestyle to reduce recurrence of condition will improve Ability to verbalize feelings will improve     Medication Management: Evaluate patient's response, side effects, and tolerance of medication regimen.  Therapeutic Interventions: 1 to 1 sessions, Unit Group sessions and Medication administration.  Evaluation of Outcomes: Progressing   RN Treatment Plan for Primary Diagnosis: Schizoaffective disorder, depressive type (HCC) Long Term Goal(s): Knowledge of disease and therapeutic regimen to maintain health will improve  Short Term Goals: Ability to remain free from injury will improve, Ability to verbalize frustration and anger appropriately will improve, Ability to  participate in decision making will improve, Ability to verbalize feelings will improve, Ability to identify and develop effective coping behaviors will improve, and Compliance with prescribed medications will improve  Medication Management: RN will administer medications as ordered by provider, will assess and evaluate patient's response and provide education to patient for prescribed medication. RN will report any adverse and/or side effects to prescribing provider.  Therapeutic Interventions: 1 on 1 counseling sessions, Psychoeducation, Medication administration, Evaluate responses to treatment, Monitor vital signs and CBGs as ordered, Perform/monitor CIWA, COWS, AIMS and Fall Risk screenings as ordered, Perform wound care treatments as ordered.  Evaluation of Outcomes: Progressing   LCSW Treatment Plan for Primary Diagnosis: Schizoaffective disorder, depressive type (HCC) Long Term  Goal(s): Safe transition to appropriate next level of care at discharge, Engage patient in therapeutic group addressing interpersonal concerns.  Short Term Goals: Engage patient in aftercare planning with referrals and resources, Increase social support, Increase emotional regulation, Facilitate patient progression through stages of change regarding substance use diagnoses and concerns, and Increase skills for wellness and recovery  Therapeutic Interventions: Assess for all discharge needs, 1 to 1 time with Social worker, Explore available resources and support systems, Assess for adequacy in community support network, Educate family and significant other(s) on suicide prevention, Complete Psychosocial Assessment, Interpersonal group therapy.  Evaluation of Outcomes: Progressing   Progress in Treatment: Attending groups: No Participating in groups: No Taking medication as prescribed: Yes. Toleration medication: Yes. Family/Significant other contact made: Yes, individual(s) contacted:  Yolanda [name]  [phone] Patient understands diagnosis: Yes. Discussing patient identified problems/goals with staff: Yes. Medical problems stabilized or resolved: Yes. Denies suicidal/homicidal ideation: Yes. Issues/concerns per patient self-inventory: No.   New problem(s) identified: none reported   New Short Term/Long Term Goal(s): medication stabilization, elimination of SI thoughts, development of comprehensive mental wellness plan.      Patient Goals:   placement   Discharge Plan or Barriers: Patient recently admitted. CSW will continue to follow and assess for appropriate referrals and possible discharge planning.      Reason for Continuation of Hospitalization: Depression Suicidal ideation   Estimated Length of Stay: Pt still expected to discharge  to group home awaiting Single Case agreement and enhanced rate from [facility].  Last 3 [place] Suicide Severity Risk Score: Flowsheet Row Ad[facility] (Current) [facility] from 12[facility]19/2022 in [facility] at [facility] ED from 11/12/2022 in [facility] Department at [facility]  C-SSRS RISK CATEGORY High Risk High Risk Moderate Risk       Last [facility] 2/9 Scores:     No data to display          Scribe for Treatment Team: Lavine Hargrove O Aldahir Litaker, LCSWA 07/17/2023 2:27 PM

## 2023-07-18 DIAGNOSIS — F251 Schizoaffective disorder, depressive type: Secondary | ICD-10-CM | POA: Diagnosis not present

## 2023-07-18 MED ORDER — WHITE PETROLATUM EX OINT
TOPICAL_OINTMENT | CUTANEOUS | Status: AC
  Administered 2023-07-18: 1
  Filled 2023-07-18: qty 10

## 2023-07-18 NOTE — Progress Notes (Signed)
   07/18/23 1100  Psych Admission Type (Psych Patients Only)  Admission Status Involuntary  Psychosocial Assessment  Patient Complaints Anxiety  Eye Contact Fair  Facial Expression Sad  Affect Depressed  Speech Slow;Logical/coherent  Interaction Assertive;Childlike;Needy  Motor Activity Tremors  Appearance/Hygiene Disheveled  Behavior Characteristics Cooperative  Mood Depressed;Pleasant  Thought Process  Coherency WDL  Content WDL  Delusions None reported or observed  Perception WDL  Hallucination None reported or observed  Judgment Impaired  Confusion None  Danger to Self  Current suicidal ideation? Denies  Self-Injurious Behavior No self-injurious ideation or behavior indicators observed or expressed   Danger to Others  Danger to Others None reported or observed  Danger to Others Abnormal  Harmful Behavior to others No threats or harm toward other people

## 2023-07-18 NOTE — Group Note (Signed)
 LCSW Group Therapy Note  Group Date: 07/18/2023 Start Time: 1100 End Time: 1200   Type of Therapy and Topic:  Group Therapy: Positive Affirmations/ vision boards  Participation Level:  Active   Description of Group:   This group addressed positive affirmation towards self and others.  Patients went around the room and identified two positive things about themselves and two positive things about a peer in the room.  Patients reflected on how it felt to share something positive with others, to identify positive things about themselves, and to hear positive things from others/ Patients were encouraged to have a daily reflection of positive characteristics or circumstances.   Therapeutic Goals: Patients will verbalize two of their positive qualities Patients will demonstrate empathy for others by stating two positive qualities about a peer in the group Patients will verbalize their feelings when voicing positive self affirmations and when voicing positive affirmations of others Patients will discuss the potential positive impact on their wellness/recovery of focusing on positive traits of self and others.  Summary of Patient Progress:  She actively engaged in the discussion and . She was able to identify positive affirmations about herself as well as other group members. Patient demonstrated gratitude and  insight into the subject matter, was respectful of peers, participated throughout the entire session.  Therapeutic Modalities:   Cognitive Behavioral Therapy Motivational Interviewing    Golda Louder, LCSWA 07/18/2023  12:28 PM

## 2023-07-18 NOTE — Group Note (Signed)
 Date:  07/18/2023 Time:  2:17 PM  Group Topic/Focus:   Healthy Communication:  The focus of this group is to discuss what boundaries are, making the distinction between healthy and unhealthy boundaries, and exploring different types of boundaries like physical, emotional, intellectual, and time-based.    Participation Level:  None  Participation Quality:  Inattentive  Affect:  Appropriate  Cognitive:  Appropriate  Insight: Appropriate  Engagement in Group:  None  Modes of Intervention:  Discussion and Education  Additional Comments:    Cruz JONETTA Mars 07/18/2023, 2:17 PM

## 2023-07-18 NOTE — [facility] Group Notes (Signed)
 Adult Psychoeducational Group Note  Date:  07/18/2023 Time:  9:28 PM  Group Topic/Focus:  Wrap-Up Group:   The focus of this group is to help patients review their daily goal of treatment and discuss progress on daily workbooks.  Participation Level:  Active  Participation Quality:  Attentive  Affect:  Appropriate  Cognitive:  Alert  Insight: Appropriate  Engagement in Group:  Engaged  Modes of Intervention:  Discussion  Additional Comments:  Patient attended and participated in the Wrap-up group.  Yolanda [name] 07/18/2023, 9:28 PM

## 2023-07-18 NOTE — Plan of Care (Signed)
   Problem: Coping: Goal: Coping ability will improve Outcome: Progressing   Problem: Medication: Goal: Compliance with prescribed medication regimen will improve Outcome: Progressing

## 2023-07-18 NOTE — Progress Notes (Signed)
   07/17/23 2045  Psych Admission Type (Psych Patients Only)  Admission Status Involuntary  Psychosocial Assessment  Patient Complaints Depression;Crying spells  Eye Contact Fair  Facial Expression Sad  Affect Depressed  Speech Slow;Logical/coherent  Interaction Assertive;Superficial;Needy  Motor Activity Tremors  Appearance/Hygiene Disheveled  Behavior Characteristics Cooperative;Intrusive;Other (Comment) (Redirectable)  Mood Depressed;Pleasant  Thought Process  Coherency WDL  Content Preoccupation;Other (Comment) (attention seeking)  Delusions None reported or observed  Perception WDL  Hallucination None reported or observed  Judgment Impaired  Confusion None  Danger to Self  Current suicidal ideation? Denies  Self-Injurious Behavior No self-injurious ideation or behavior indicators observed or expressed   Agreement Not to Harm Self Yes  Description of Agreement Verbal

## 2023-07-18 NOTE — Progress Notes (Signed)
 [facility] MD Progress Note  07/18/2023 4:48 PM Yolanda [name]  MRN:  [id number] Subjective:   48 year old African-American female, single, on SSI disability, homeless.  Background history of intellectual disability, substance use disorder and schizoaffective disorder depressive type.  Presented via emergency services on account of worsening depression associated with suicidal thoughts.  Expressed thoughts of suicide by stabbing herself.  Chart reviewed today.  Patient discussed at multidisciplinary team meeting.  Staff reports that she slept well last night.  No changes in her mental state.  No PRNs required lately.  Daily notes: [name] is seen. Chart reviewed. Patient was discussed with her treatment team during the morning meeting today. She presents a bit happier & less worried today. She says she is doing alright. She denies any new issues or concerns. She is visible on the unit, attending group sessions. She is taking & tolerating her treatment regimen. Denies any side effects. She currently denies any SIHI, AVH, delusional thoughts or paranoia. She does not appear to be responding to any internal stimuli. There are no changes made on her current plan of care. Will continue as already in progress. Vital signs remain stable.  Principal Problem: Schizoaffective disorder, depressive type (HCC) Diagnosis: Principal Problem:   Schizoaffective disorder, depressive type (HCC) Active Problems:   GERD (gastroesophageal reflux disease)   Intellectual disability   Tobacco use disorder  Total Time spent with patient: 20 minutes  Past Psychiatric History: Extensive history of mental illness.  She has been tried on multiple medications over the years.  See H&P for full details.  Past Medical History:  Past Medical History:  Diagnosis Date   Bipolar affect, depressed (HCC)    Constipation 08/17/2022   Depression    Falls 07/21/2022   Fracture of femoral neck, right, closed (HCC) 01/17/2022    Herpes simplex 08/22/2017   Open fracture dislocation of right elbow joint 01/17/2022    Past Surgical History:  Procedure Laterality Date   NO PAST SURGERIES     SALPINGECTOMY     Family History: History reviewed. No pertinent family history. Family Psychiatric  History: Extensive family history of mood disorder. Social History:  Social History   Substance and Sexual Activity  Alcohol Use Yes     Social History   Substance and Sexual Activity  Drug Use Yes   Types: Cocaine, Marijuana    Social History   Socioeconomic History   Marital status: Single    Spouse name: Not on file   Number of children: Not on file   Years of education: Not on file   Highest education level: Not on file  Occupational History   Not on file  Tobacco Use   Smoking status: Every Day   Smokeless tobacco: Not on file  Substance and Sexual Activity   Alcohol use: Yes   Drug use: Yes    Types: Cocaine, Marijuana   Sexual activity: Yes  Other Topics Concern   Not on file  Social History Narrative   Not on file   Social Drivers of Health   Financial Resource Strain: Low Risk  (09/12/2022)   Received from [facility], [facility]   Overall Financial Resource Strain (CARDIA)    Difficulty of Paying Living Expenses: Not hard at all  Food Insecurity: Patient Declined (12/20/2022)   Hunger Vital Sign    Worried About Running Out of Food in the Last Year: Patient declined    Ran Out of Food in the Last Year: Patient declined  Transportation Needs:  No Transportation Needs (12/20/2022)   PRAPARE - Administrator, Civil Service (Medical): No    Lack of Transportation (Non-Medical): No  Physical Activity: Not on file  Stress: No Stress Concern Present (07/17/2022)   Received from [facility], [facility] of Occup[facility] - Occupational Stress Questionnaire    Feeling of Stress : Not at all  Social Connections: Unknown (07/16/2022)   Received from [facility], [facility]   Social Network    Social Network: Not on file    Current Medications: Current Facility-Administered Medications  Medication Dose Route Frequency Provider Last Rate Last Admin   acetaminophen  (TYLENOL ) tablet 650 mg  650 mg Oral Q6H PRN Trudy Carwin, NP   650 mg at 07/18/23 1019   alum & mag hydroxide-simeth (MAALOX/MYLANTA) 200-200-20 MG/5ML suspension 30 mL  30 mL Oral Q4H PRN Onuoha, Chinwendu V, NP   30 mL at 05/31/23 0801   atorvastatin  (LIPITOR) tablet 10 mg  10 mg Oral Daily Ruel Dimmick I, NP   10 mg at 07/17/23 1621   busPIRone  (BUSPAR ) tablet 15 mg  15 mg Oral BID Latriece Anstine I, NP   15 mg at 07/18/23 0810   cyanocobalamin  (VITAMIN B12) injection 1,000 mcg  1,000 mcg Intramuscular Q30 days Evelena Figures, MD   1,000 mcg at 07/03/23 1009   diphenhydrAMINE  (BENADRYL ) capsule 50 mg  50 mg Oral TID PRN Trudy Carwin, NP   50 mg at 06/03/23 1110   Or   diphenhydrAMINE  (BENADRYL ) injection 50 mg  50 mg Intramuscular TID PRN Trudy Carwin, NP       docusate sodium  (COLACE) capsule 100 mg  100 mg Oral Daily Tex Drilling, NP   100 mg at 07/18/23 0810   haloperidol  (HALDOL ) tablet 5 mg  5 mg Oral TID PRN Collene Gouge I, NP   5 mg at 06/03/23 1110   Or   haloperidol  lactate (HALDOL ) injection 5 mg  5 mg Intramuscular TID PRN Collene Gouge I, NP       hydrocortisone  cream 1 %   Topical BID Ntuen, Tina C, FNP   Given at 07/02/23 1733   hydrOXYzine  (ATARAX ) tablet 25 mg  25 mg Oral TID PRN Nguyen, Julie, DO   25 mg at 07/07/23 0228   levothyroxine  (SYNTHROID ) tablet 25 mcg  25 mcg Oral Q0600 Parker, Alvin S, MD   25 mcg at 07/18/23 9387   LORazepam  (ATIVAN ) tablet 2 mg  2 mg Oral TID PRN Trudy Carwin, NP   2 mg at 06/03/23 1110   Or   LORazepam  (ATIVAN ) injection 2 mg  2 mg Intramuscular TID PRN Trudy Carwin, NP       magnesium  hydroxide (MILK OF MAGNESIA) suspension 30 mL  30 mL Oral Daily PRN Trudy Carwin, NP   30 mL at 06/23/23 1856   melatonin tablet 5 mg  5 mg  Oral QHS Nkwenti, Doris, NP   5 mg at 07/17/23 2151   nicotine  polacrilex (NICORETTE ) gum 2 mg  2 mg Oral PRN Goli, Veeraindar, MD   2 mg at 07/18/23 1020   paliperidone  (INVEGA ) 24 hr tablet 6 mg  6 mg Oral Daily Nkwenti, Doris, NP   6 mg at 07/17/23 2151   pantoprazole  (PROTONIX ) EC tablet 40 mg  40 mg Oral Daily Herman Fiero I, NP   40 mg at 07/18/23 0810   polyethylene glycol (MIRALAX  / GLYCOLAX ) packet 17 g  17 g Oral Daily PRN Goli, Veeraindar,  MD   17 g at 06/07/23 1847   selenium  sulfide (SELSUN ) 1 % shampoo   Topical Daily PRN Izediuno, Jerrell LABOR, MD       sertraline  (ZOLOFT ) tablet 150 mg  150 mg Oral Daily Massengill, Nathan, MD   150 mg at 07/18/23 9189   SUMAtriptan  (IMITREX ) tablet 25 mg  25 mg Oral BID PRN Tex Drilling, NP   25 mg at 07/10/23 1642   traZODone  (DESYREL ) tablet 50 mg  50 mg Oral QHS Nkwenti, Drilling, NP   50 mg at 07/17/23 2151   Vitamin D  (Ergocalciferol ) (DRISDOL ) 1.25 MG (50000 UNIT) capsule 50,000 Units  50,000 Units Oral Q7 days Tex Drilling, NP   50,000 Units at 07/13/23 1731   Lab Results:  No results found for this or any previous visit (from the past 48 hours).  Blood Alcohol level:  Lab Results  Component Value Date   ETH <10 12/19/2022   ETH <10 11/21/2019   Metabolic Disorder Labs: Lab Results  Component Value Date   HGBA1C 5.6 06/16/2023   MPG 114.02 06/16/2023   MPG 79.58 12/21/2022   No results found for: PROLACTIN Lab Results  Component Value Date   CHOL 256 (H) 06/16/2023   TRIG 224 (H) 06/16/2023   HDL 83 06/16/2023   CHOLHDL 3.1 06/16/2023   VLDL 45 (H) 06/16/2023   LDLCALC 128 (H) 06/16/2023   LDLCALC 149 (H) 12/21/2022   Physical Findings: AIMS: Facial and Oral Movements Muscles of Facial Expression: None Lips and Perioral Area: None Jaw: None Tongue: None,Extremity Movements Upper (arms, wrists, hands, fingers): None Lower (legs, knees, ankles, toes): None, Trunk Movements Neck, shoulders, hips: None, Global  Judgements Severity of abnormal movements overall : None Incapacitation due to abnormal movements: None Patient's awareness of abnormal movements: No Awareness, Dental Status Current problems with teeth and/or dentures?: No Does patient usually wear dentures?: No  CIWA:    COWS:     Musculoskeletal: Strength & Muscle Tone: within normal limits Gait & Station: normal Patient leans: N/A  Psychiatric Specialty Exam:  Presentation  General Appearance:  Casually dressed, overweight, not in any distress.  No EPS. Eye Contact: Fair  Speech: Slurred and slow.  Speech Volume: At baseline.  Mood and Affect  Mood: Depressed but not pervasively so. Affect: Blunted and mood congruent.  Thought Process  Thought Processes: Decreased speed of thoughts but goal-directed.  Descriptions of Associations:  Linear.  Orientation:Full (Time, Place and Person)  Thought Content: Negative ruminations about early life events.  Negative rumination about being in the hospital during the holidays.  No active suicidal thoughts.  No homicidal thoughts.  No thoughts of violence.  No delusional preoccupation.   Hallucinations: No hallucination in any modality.   Sensorium  Memory: Immediate Fair  Judgment: Fair  Insight: Fair   Chartered Certified Accountant: Fair  Attention Span: Fair  Recall: [employer] of Knowledge: Fair  Language: Fair   Psychomotor Activity  Psychomotor Activity: No data recorded  Physical Exam: Physical Exam HENT:     Nose: Nose normal.     Mouth/Throat:     Pharynx: Oropharynx is clear.  Cardiovascular:     Rate and Rhythm: Normal rate.     Pulses: Normal pulses.  Pulmonary:     Effort: Pulmonary effort is normal.  Genitourinary:    Comments: Deferred Musculoskeletal:     Cervical back: Normal range of motion.  Skin:    General: Skin is warm and dry.  Neurological:  General: No focal deficit present.     Mental Status:  She is alert and oriented to person, place, and time.    Review of Systems  Constitutional:  Negative for chills, diaphoresis and fever.  HENT:  Negative for congestion and sore throat.   Respiratory:  Negative for cough, shortness of breath and wheezing.   Cardiovascular:  Negative for chest pain and palpitations.  Gastrointestinal:  Negative for abdominal pain, diarrhea, heartburn, nausea and vomiting.  Musculoskeletal:  Negative for joint pain and myalgias.  Neurological:  Negative for dizziness, tingling, tremors, sensory change, speech change, focal weakness, seizures, loss of consciousness, weakness and headaches.  Psychiatric/Behavioral:  Negative for hallucinations, memory loss, substance abuse and suicidal ideas. The patient is not nervous/anxious and does not have insomnia.    Blood pressure 116/88, pulse 85, temperature 97.6 F (36.4 C), temperature source Oral, resp. rate 16, height 4' 11 (1.499 m), weight 63 kg, SpO2 100%. Body mass index is 28.05 kg/m.  Treatment Plan Summary: Patient is at her baseline.  She is tolerating her medications well.  There is no dangerousness.  She is stable for care as soon as we finalize safe an appropriate disposition.  We will continue her medications and evaluate her further.  1.  Continue Invega  ER 6 mg at bedtime. 2.  Continue sertraline  150 mg daily. 3.  Continue trazodone  50 mg as needed for insomnia. 4.  Continue Buspar  15 mg po bid for anxiety. 5.  Continue Hydroxyzine  25 mg po tid prn for anxiety. 6.  Continue Melatonin 5 mg po q bedtime for sleep. 7.  Continue Miralax  17 gm po daily prn for constipation. 8.  Continue Vitamin D1.25 (50,000 units daily for bone health. 5.  Continue to monitor mood behavior and interaction with others. 5.  Continue to encourage unit groups and therapeutic activities.  Mac Bolster, NP, pmhnp, fnp-bc. 07/18/2023, 4:48 PM Patient ID: Mitch JONETTA Lance, female   DOB: 1975/11/06, 49 y.o.   MRN:  [id number] Patient ID: ALIYANAH ROZAS, female   DOB: July 13, 1975, 48 y.o.   MRN: [id number]

## 2023-07-19 DIAGNOSIS — F251 Schizoaffective disorder, depressive type: Secondary | ICD-10-CM | POA: Diagnosis not present

## 2023-07-19 NOTE — Group Note (Signed)
 Recreation Therapy Group Note   Group Topic:Health and Wellness  Group Date: 07/19/2023 Start Time: 0930 End Time: 1007 Facilitators: Monette Omara-McCall, LRT,CTRS Location: [address] Dayroom   Group Topic: Wellness  Goal Area(s) Addresses:  Patient will define components of whole wellness. Patient will verbalize benefit of whole wellness.  Group Description: LRT and patients discussed the importance of exercise and its affect on your body and lifestyle. Patients took turns leading the group in the exercises of their choosing. Patients also chose the music they wanted to exercise to as well.    Education: Wellness, Building Control Surveyor.   Education Outcome: Acknowledges education/In group clarification offered/Needs additional education.    Affect/Mood: N/A   Participation Level: Did not attend    Clinical Observations/Individualized Feedback:     Plan: Continue to engage patient in RT group sessions 2-3x/week.   Matt Delpizzo-McCall, LRT,CTRS 07/19/2023 11:46 AM

## 2023-07-19 NOTE — Plan of Care (Signed)
  Problem: Education: Goal: Ability to make informed decisions regarding treatment will improve Outcome: Progressing   Problem: Coping: Goal: Coping ability will improve 07/19/2023 2010 by Michele Dorothe POUR, RN Outcome: Progressing 07/19/2023 1131 by Michele Dorothe POUR, RN Outcome: Not Progressing   Problem: Health Behavior/Discharge Planning: Goal: Identification of resources available to assist in meeting health care needs will improve Outcome: Progressing   Problem: Medication: Goal: Compliance with prescribed medication regimen will improve 07/19/2023 2010 by Michele Dorothe POUR, RN Outcome: Progressing 07/19/2023 1131 by Michele Dorothe POUR, RN Outcome: Progressing   Problem: Self-Concept: Goal: Ability to disclose and discuss suicidal ideas will improve Outcome: Progressing   Problem: Education: Goal: Emotional status will improve Outcome: Progressing

## 2023-07-19 NOTE — Progress Notes (Signed)
   07/19/23 0600  15 Minute Checks  Location Bedroom  Visual Appearance Calm  Behavior Sleeping  Sleep (Behavioral Health Patients Only)  Calculate sleep? (Click Yes once per 24 hr at 0600 safety check) Yes  Documented sleep last 24 hours 7.75

## 2023-07-19 NOTE — Progress Notes (Signed)
   07/19/23 2000  Psych Admission Type (Psych Patients Only)  Admission Status Involuntary  Psychosocial Assessment  Patient Complaints None  Eye Contact Fair  Facial Expression Sad  Affect Depressed  Speech Logical/coherent  Interaction Assertive  Motor Activity Tremors  Appearance/Hygiene Disheveled  Behavior Characteristics Cooperative  Mood Depressed  Thought Process  Coherency WDL  Content WDL  Delusions None reported or observed  Perception WDL  Hallucination None reported or observed  Judgment Impaired  Confusion None  Danger to Self  Current suicidal ideation? Denies  Agreement Not to Harm Self Yes  Description of Agreement Verbal  Danger to Others  Danger to Others None reported or observed  Danger to Others Abnormal  Harmful Behavior to others No threats or harm toward other people

## 2023-07-19 NOTE — Progress Notes (Signed)
   07/18/23 2118  Psych Admission Type (Psych Patients Only)  Admission Status Involuntary  Psychosocial Assessment  Patient Complaints Anxiety;Depression  Eye Contact Fair  Facial Expression Flat  Affect Depressed  Speech Logical/coherent;Slow  Interaction Assertive;Needy  Motor Activity Tremors  Appearance/Hygiene Disheveled  Behavior Characteristics Cooperative  Mood Depressed  Thought Process  Coherency WDL  Content WDL  Delusions None reported or observed  Perception WDL  Hallucination None reported or observed  Judgment Impaired  Confusion None  Danger to Self  Current suicidal ideation? Denies  Agreement Not to Harm Self Yes  Description of Agreement verbal  Danger to Others  Danger to Others None reported or observed

## 2023-07-19 NOTE — Progress Notes (Signed)
 [facility] MD Progress Note  07/19/2023 1:56 PM Yolanda [name]  MRN:  [id number] Subjective:   48 year old African-American female, single, on SSI disability, homeless.  Background history of intellectual disability, substance use disorder and schizoaffective disorder depressive type.  Presented via emergency services on account of worsening depression associated with suicidal thoughts.  Expressed thoughts of suicide by stabbing herself.  Chart reviewed today.  Patient discussed at multidisciplinary team meeting.  Staff reports that she slept well last night.  No changes in her mental state.  No PRNs required lately.  Daily notes: Yolanda [name] is seen. Chart reviewed. Patient was discussed with her treatment team during the morning meeting this morning. She reports, I'm doing okay. I'm just feeling a little sleepy. I do not have any complaints today. Yolanda [name] remains with any new issues or concerns. She continues to hope to hear about the good news of getting into a group home setting. She currently denies any SIHI, AVH, delusional thoughts or paranoia. She does not appear to be responding to any internal stimuli. There are no changes made on her current plan of care. Will continue as already in progress. Vital signs remain stable.  Principal Problem: Schizoaffective disorder, depressive type (HCC) Diagnosis: Principal Problem:   Schizoaffective disorder, depressive type (HCC) Active Problems:   GERD (gastroesophageal reflux disease)   Intellectual disability   Tobacco use disorder  Total Time spent with patient: 20 minutes  Past Psychiatric History: Extensive history of mental illness.  She has been tried on multiple medications over the years.  See H&P for full details.  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 No pertinent family history. Family Psychiatric  History: Extensive family history of mood disorder. Social History:  Social History   Substance and Sexual Activity  Alcohol Use Yes     Social History   Substance and Sexual Activity  Drug Use Yes   Types: Cocaine, Marijuana    Social History   Socioeconomic History   Marital status: Single    Spouse name: Not on file   Number of children: Not on file   Years of education: Not on file   Highest education level: Not on file  Occupational History   Not on file  Tobacco Use   Smoking status: Every Day   Smokeless tobacco: Not on file  Substance and Sexual Activity   Alcohol use: Yes   Drug use: Yes    Types: Cocaine, Marijuana   Sexual activity: Yes  Other Topics Concern   Not on file  Social History Narrative   Not on file   Social Drivers of Health   Financial Resource Strain: Low Risk  (09/12/2022)   Received from [facility], [facility]   Overall Financial Resource Strain (CARDIA)    Difficulty of Paying Living Expenses: Not hard at all  Food Insecurity: Patient Declined (12/20/2022)   Hunger Vital Sign    Worried About Running Out of Food in the Last Year: Patient declined    Ran Out of Food in the Last Year: Patient declined  Transportation  Needs: No Transportation Needs (12/20/2022)   PRAPARE - Administrator, Civil Service (Medical): No    Lack of Transportation (Non-Medical): No  Physical Activity: Not on file  Stress: No Stress Concern Present (07/17/2022)   Received from [facility], [facility] - Occupational Stress Questionnaire    Feeling of Stress : Not at all  Social Connections: Unknown (07/16/2022)   Received from [facility],  [facility]   Social Network    Social Network: Not on file    Current Medications: Current Facility-Administered Medications  Medication Dose Route Frequency Provider Last Rate Last Admin   acetaminophen  (TYLENOL ) tablet 650 mg  650 mg Oral Q6H PRN Trudy Carwin, NP   650 mg at 07/19/23 1234   alum & mag hydroxide-simeth (MAALOX/MYLANTA) 200-200-20 MG/5ML suspension 30 mL  30 mL Oral Q4H PRN Onuoha, Chinwendu V, NP   30 mL at 05/31/23 0801   atorvastatin  (LIPITOR) tablet 10 mg  10 mg Oral Daily Sabriya Yono I, NP   10 mg at 07/18/23 1700   busPIRone  (BUSPAR ) tablet 15 mg  15 mg Oral BID Rossie Bretado I, NP   15 mg at 07/19/23 0757   cyanocobalamin  (VITAMIN B12) injection 1,000 mcg  1,000 mcg Intramuscular Q30 days Evelena Figures, MD   1,000 mcg at 07/03/23 1009   diphenhydrAMINE  (BENADRYL ) capsule 50 mg  50 mg Oral TID PRN Trudy Carwin, NP   50 mg at 06/03/23 1110   Or   diphenhydrAMINE  (BENADRYL ) injection 50 mg  50 mg Intramuscular TID PRN Trudy Carwin, NP       docusate sodium  (COLACE) capsule 100 mg  100 mg Oral Daily Nkwenti, Doris, NP   100 mg at 07/19/23 0757   haloperidol  (HALDOL ) tablet 5 mg  5 mg Oral TID PRN Collene Gouge I, NP   5 mg at 06/03/23 1110   Or   haloperidol  lactate (HALDOL ) injection 5 mg  5 mg Intramuscular TID PRN Collene Gouge I, NP       hydrocortisone  cream 1 %   Topical BID Ntuen, Tina C, FNP   1 Application at 07/19/23 0758   hydrOXYzine  (ATARAX ) tablet 25 mg  25 mg Oral TID PRN Nguyen, Julie, DO   25 mg at 07/07/23 0228   levothyroxine  (SYNTHROID ) tablet 25 mcg  25 mcg Oral Q0600 Parker, Alvin S, MD   25 mcg at 07/19/23 9351   LORazepam  (ATIVAN ) tablet 2 mg  2 mg Oral TID PRN Trudy Carwin, NP   2 mg at 06/03/23 1110   Or   LORazepam  (ATIVAN ) injection 2 mg  2 mg Intramuscular TID PRN Trudy Carwin, NP       magnesium  hydroxide (MILK OF MAGNESIA) suspension 30 mL  30 mL Oral Daily PRN Trudy Carwin, NP   30 mL at 07/18/23 1701   melatonin tablet 5 mg  5 mg  Oral QHS Nkwenti, Doris, NP   5 mg at 07/18/23 2118   nicotine  polacrilex (NICORETTE ) gum 2 mg  2 mg Oral PRN Goli, Veeraindar, MD   2 mg at 07/19/23 1236   paliperidone  (INVEGA ) 24 hr tablet 6 mg  6 mg Oral Daily Nkwenti, Doris, NP   6 mg at 07/18/23 2118   pantoprazole  (PROTONIX ) EC tablet 40 mg  40 mg Oral Daily Deforrest Bogle I, NP   40 mg at 07/19/23 0757   polyethylene glycol (MIRALAX  / GLYCOLAX ) packet 17 g  17 g Oral Daily PRN  Goli, Veeraindar, MD   17 g at 06/07/23 1847   selenium  sulfide (SELSUN ) 1 % shampoo   Topical Daily PRN Izediuno, Jerrell LABOR, MD       sertraline  (ZOLOFT ) tablet 150 mg  150 mg Oral Daily Massengill, Nathan, MD   150 mg at 07/19/23 9242   SUMAtriptan  (IMITREX ) tablet 25 mg  25 mg Oral BID PRN Tex Drilling, NP   25 mg at 07/10/23 1642   traZODone  (DESYREL ) tablet 50 mg  50 mg Oral QHS Tex Drilling, NP   50 mg at 07/18/23 2118   Vitamin D  (Ergocalciferol ) (DRISDOL ) 1.25 MG (50000 UNIT) capsule 50,000 Units  50,000 Units Oral Q7 days Tex Drilling, NP   50,000 Units at 07/13/23 1731   Lab Results:  No results found for this or any previous visit (from the past 48 hours).  Blood Alcohol level:  Lab Results  Component Value Date   ETH <10 12/19/2022   ETH <10 11/21/2019   Metabolic Disorder Labs: Lab Results  Component Value Date   HGBA1C 5.6 06/16/2023   MPG 114.02 06/16/2023   MPG 79.58 12/21/2022   No results found for: PROLACTIN Lab Results  Component Value Date   CHOL 256 (H) 06/16/2023   TRIG 224 (H) 06/16/2023   HDL 83 06/16/2023   CHOLHDL 3.1 06/16/2023   VLDL 45 (H) 06/16/2023   LDLCALC 128 (H) 06/16/2023   LDLCALC 149 (H) 12/21/2022   Physical Findings: AIMS: Facial and Oral Movements Muscles of Facial Expression: None Lips and Perioral Area: None Jaw: None Tongue: None,Extremity Movements Upper (arms, wrists, hands, fingers): None Lower (legs, knees, ankles, toes): None, Trunk Movements Neck, shoulders, hips: None, Global  Judgements Severity of abnormal movements overall : None Incapacitation due to abnormal movements: None Patient's awareness of abnormal movements: No Awareness, Dental Status Current problems with teeth and/or dentures?: No Does patient usually wear dentures?: No  CIWA:    COWS:     Musculoskeletal: Strength & Muscle Tone: within normal limits Gait & Station: normal Patient leans: N/A  Psychiatric Specialty Exam:  Presentation  General Appearance:  Casually dressed, overweight, not in any distress.  No EPS. Eye Contact: Good  Speech: Slurred and slow.  Speech Volume: At baseline.  Mood and Affect  Mood: Depressed but not pervasively so. Affect: Blunted and mood congruent.  Thought Process  Thought Processes: Decreased speed of thoughts but goal-directed.  Descriptions of Associations:  Linear.  Orientation:Full (Time, Place and Person)  Thought Content: Negative ruminations about early life events.  Negative rumination about being in the hospital during the holidays.  No active suicidal thoughts.  No homicidal thoughts.  No thoughts of violence.  No delusional preoccupation.   Hallucinations: No hallucination in any modality.   Sensorium  Memory: Immediate Good; Recent Good; Remote Fair  Judgment: Fair  Insight: Fair   Chartered Certified Accountant: Good  Attention Span: Good  Recall: [place] of Knowledge: Fair  Language: Fair   Psychomotor Activity  Psychomotor Activity: Psychomotor Activity: Normal   Physical Exam: Physical Exam HENT:     Nose: Nose normal.     Mouth/Throat:     Pharynx: Oropharynx is clear.  Cardiovascular:     Rate and Rhythm: Normal rate.     Pulses: Normal pulses.  Pulmonary:     Effort: Pulmonary effort is normal.  Genitourinary:    Comments: Deferred Musculoskeletal:     Cervical back: Normal range of motion.  Skin:    General: Skin  is warm and dry.  Neurological:     General: No  focal deficit present.     Mental Status: She is alert and oriented to person, place, and time.    Review of Systems  Constitutional:  Negative for chills, diaphoresis and fever.  HENT:  Negative for congestion and sore throat.   Respiratory:  Negative for cough, shortness of breath and wheezing.   Cardiovascular:  Negative for chest pain and palpitations.  Gastrointestinal:  Negative for abdominal pain, diarrhea, heartburn, nausea and vomiting.  Musculoskeletal:  Negative for joint pain and myalgias.  Neurological:  Negative for dizziness, tingling, tremors, sensory change, speech change, focal weakness, seizures, loss of consciousness, weakness and headaches.  Psychiatric/Behavioral:  Negative for hallucinations, memory loss, substance abuse and suicidal ideas. The patient is not nervous/anxious and does not have insomnia.    Blood pressure 116/88, pulse 85, temperature 97.6 F (36.4 C), temperature source Oral, resp. rate 16, height 4' 11 (1.499 m), weight 63 kg, SpO2 100%. Body mass index is 28.05 kg/m.  Treatment Plan Summary: Patient is at her baseline.  She is tolerating her medications well.  There is no dangerousness.  She is stable for care as soon as we finalize safe an appropriate disposition.  We will continue her medications and evaluate her further.  1.  Continue Invega  ER 6 mg at bedtime. 2.  Continue sertraline  150 mg daily. 3.  Continue trazodone  50 mg as needed for insomnia. 4.  Continue Buspar  15 mg po bid for anxiety. 5.  Continue Hydroxyzine  25 mg po tid prn for anxiety. 6.  Continue Melatonin 5 mg po q bedtime for sleep. 7.  Continue Miralax  17 gm po daily prn for constipation. 8.  Continue Vitamin D1.25 (50,000 units daily for bone health. 5.  Continue to monitor mood behavior and interaction with others. 5.  Continue to encourage unit groups and therapeutic activities.  Mac Bolster, NP, pmhnp, fnp-bc. 07/19/2023, 1:56 PM Patient ID: Yolanda [name], female    DOB: 17-Aug-1975, 48 y.o.   MRN: [id number] Patient ID: Yolanda [name], female   DOB: Dec 05, 1975, 48 y.o.   MRN: [id number] Patient ID: Yolanda [name], female   DOB: 12/19/1975, 48 y.o.   MRN: [id number]

## 2023-07-19 NOTE — Progress Notes (Signed)
   07/19/23 0900  Psych Admission Type (Psych Patients Only)  Admission Status Involuntary  Psychosocial Assessment  Patient Complaints Sadness  Eye Contact Fair  Facial Expression Sad  Affect Depressed  Speech Logical/coherent  Interaction Assertive  Motor Activity Tremors  Appearance/Hygiene Disheveled  Behavior Characteristics Cooperative  Mood Depressed  Thought Process  Coherency WDL  Content WDL  Delusions None reported or observed  Perception WDL  Hallucination None reported or observed  Judgment Impaired  Confusion None  Danger to Self  Current suicidal ideation? Denies  Agreement Not to Harm Self Yes  Description of Agreement Verbal  Danger to Others  Danger to Others None reported or observed  Danger to Others Abnormal  Harmful Behavior to others No threats or harm toward other people

## 2023-07-19 NOTE — Plan of Care (Signed)
   Problem: Safety: Goal: Periods of time without injury will increase Outcome: Progressing

## 2023-07-19 NOTE — Progress Notes (Signed)
 CSW spoke with Yolanda [name]  [phone], owner of group homes, [address] and [address] [facility] located in [place], [place] who inquired if we have any patients that need placement. Miss [name] reported she has 2 immediate openings, 1 at each home. CSW contacted DSS of [facility], legal guardian Arch Ada who gave permission to send clinicals, clinicals sent.   CSW will follow up Miss [name] on 07/22/23 and will update Treatment Team.

## 2023-07-19 NOTE — Plan of Care (Signed)
  Problem: Coping: Goal: Coping ability will improve Outcome: Not Progressing   Problem: Medication: Goal: Compliance with prescribed medication regimen will improve Outcome: Progressing

## 2023-07-19 NOTE — [facility] Group Notes (Signed)
 Spiritual care group facilitated by Chaplain Rockie Sofia, B[facility] and Alan Lesches, Mdiv  Group focused on topic of strength. Group members reflected on what thoughts and feelings emerge when they hear this topic. They then engaged in facilitated dialog around how strength is present in their lives. This dialog focused on representing what strength had been to them in their lives (images and patterns given) and what they saw as helpful in their life now (what they needed / wanted).  Activity drew on narrative framework.  Patient Progress: Yolanda [name] attended group.  Though she did not verbally participate, she was engaged for the time that she was present in the group.

## 2023-07-20 DIAGNOSIS — F251 Schizoaffective disorder, depressive type: Secondary | ICD-10-CM | POA: Diagnosis not present

## 2023-07-20 NOTE — Progress Notes (Signed)
   07/20/23 1600  Psych Admission Type (Psych Patients Only)  Admission Status Involuntary  Psychosocial Assessment  Patient Complaints Depression  Eye Contact Fair  Facial Expression Sad  Affect Depressed  Speech Logical/coherent  Interaction Assertive  Motor Activity Tremors  Appearance/Hygiene Disheveled  Behavior Characteristics Cooperative  Mood Depressed  Thought Process  Coherency WDL  Content WDL  Delusions None reported or observed  Perception WDL  Hallucination None reported or observed  Judgment Impaired  Confusion None  Danger to Self  Current suicidal ideation? Denies  Danger to Others  Danger to Others None reported or observed  Danger to Others Abnormal  Harmful Behavior to others No threats or harm toward other people

## 2023-07-20 NOTE — Plan of Care (Addendum)
  Problem: Medication: Goal: Compliance with prescribed medication regimen will improve Outcome: Progressing   Problem: Self-Concept: Goal: Ability to disclose and discuss suicidal ideas will improve Outcome: Progressing   Problem: Activity: Goal: Interest or engagement in activities will improve Outcome: Progressing

## 2023-07-20 NOTE — [facility] Group Notes (Signed)
 [facility] Group Notes:  (Nursing/MHT/Case Management/Adjunct)  Date:  07/20/2023  Time:  2000  Type of Therapy:   Wrap up group  Participation Level:  Active  Participation Quality:  Appropriate, Attentive, Sharing, and Supportive  Affect:  Appropriate  Cognitive:  Alert  Insight:  Limited  Engagement in Group:  Engaged  Modes of Intervention:  Clarification, Education, and Support  Summary of Progress/Problems: Positive thinking and positive change were discussed.   Yolanda [name] 07/20/2023, 9:16 PM

## 2023-07-20 NOTE — [facility] Group Notes (Signed)
 [facility] Group Notes:  (Nursing)  Date:  07/20/2023  Time: 1300  Type of Therapy:  Psychoeducational Skills  Participation Level:  Active  Participation Quality:  Appropriate and Attentive  Affect:  Appropriate  Cognitive:  Alert and Appropriate  Insight:  Improving  Engagement in Group:  Engaged and Improving  Modes of Intervention:  Activity, Discussion, Exploration, Rapport Building, Socialization, and Support  Summary of Progress/Problems:  Yolanda [name] 07/20/2023, 5:03 PM

## 2023-07-20 NOTE — Progress Notes (Signed)
 [facility] MD Progress Note  07/20/2023 12:21 PM Yolanda [name]  MRN:  [id number]    Reason for Admission:  Per chart review: Yolanda [name] is a 47 year old African-American female, single, on SSI disability, homeless.  Background history of intellectual disability, substance use disorder and schizoaffective disorder depressive type.  Presented via emergency services on account of worsening depression associated with suicidal thoughts. Expressed thoughts of suicide by stabbing herself.   Chart Review from last 24 hours:  The patient's chart and nursing notes were reviewed. The patient's case was discussed in multidisciplinary team meeting. Vital signs within defined limits. Patient is compliant with routine medication regimen without difficulty. Sleep hours last night: 10 hours, as documented in the nursing flow sheets. No behavioral episodes or nursing concerns were reported at this time. Nicorette  gum and Tylenol  PRN medication was administered on 1/10, according to the nursing record.    Information Obtained Today During Patient Interview: The patient was seen today in her room, lying in bed covered with a blanket, stating she feels cold.  She describes her mood as, it's okay.  She rates both her anxiety and depression as a 3/10, with 10 being the most severe. Sleep and appetite are reported as satisfactory. The patient denies suicidal or homicidal ideation, as well as auditory or visual hallucinations, paranoia, or delusional thought content. She reports good concentration and states that she attended 2 groups yesterday, finding them beneficial.  Objectively,  she appears to be in no acute distress. She is alert and oriented x 4; calm, cooperative, and mood congruent with affect. She is speaking in a clear tone and moderate in volume, and normal pace, with good eye contact. There is no indication that she is currently responding to internal or external stimuli, nor is there evidence of acute  psychiatric impairment. She has remained calm throughout assessment and has answered questions appropriately. No signs of TD (Tardive Dyskinesia) or EPS (Extrapyramidal Symptoms) were observed during the assessment, and the patient reports no feelings of stiffness. AIMS score: 0.  The patient's psychiatric symptoms have remained stable for an extended period. We are continuing to await coordination between Social Work and DSS to secure an appropriate living arrangement outside of the hospital setting.    Principal Problem: Schizoaffective disorder, depressive type (HCC) Diagnosis: Principal Problem:   Schizoaffective disorder, depressive type (HCC) Active Problems:   GERD (gastroesophageal reflux disease)   Intellectual disability   Tobacco use disorder  Total Time spent with patient: 20 minutes  Past Psychiatric History: Extensive history of mental illness.  She has been tried on multiple medications over the years.  See H&P for full details.  Past Medical History:  Past Medical History:  Diagnosis Date   Bipolar affect, depressed (HCC)    Constipation 08/17/2022   Depression    Falls 07/21/2022   Fracture of femoral neck, right, closed (HCC) 01/17/2022   Herpes simplex 08/22/2017   Open fracture dislocation of right elbow joint 01/17/2022    Past Surgical History:  Procedure Laterality Date   NO PAST SURGERIES     SALPINGECTOMY     Family History: History reviewed. No pertinent family history. Family Psychiatric  History: Extensive family history of mood disorder. Social History:  Social History   Substance and Sexual Activity  Alcohol Use Yes     Social History   Substance and Sexual Activity  Drug Use Yes   Types: Cocaine, Marijuana    Social History   Socioeconomic History   Marital status:  Single    Spouse name: Not on file   Number of children: Not on file   Years of education: Not on file   Highest education level: Not on file  Occupational History    Not on file  Tobacco Use   Smoking status: Every Day   Smokeless tobacco: Not on file  Substance and Sexual Activity   Alcohol use: Yes   Drug use: Yes    Types: Cocaine, Marijuana   Sexual activity: Yes  Other Topics Concern   Not on file  Social History Narrative   Not on file   Social Drivers of Health   Financial Resource Strain: Low Risk  (09/12/2022)   Received from [facility], [facility]   Overall Financial Resource Strain (CARDIA)    Difficulty of Paying Living Expenses: Not hard at all  Food Insecurity: Patient Declined (12/20/2022)   Hunger Vital Sign    Worried About Running Out of Food in the Last Year: Patient declined    Ran Out of Food in the Last Year: Patient declined  Transportation Needs: No Transportation Needs (12/20/2022)   PRAPARE - Administrator, Civil Service (Medical): No    Lack of Transportation (Non-Medical): No  Physical Activity: Not on file  Stress: No Stress Concern Present (07/17/2022)   Received from [facility] - Occupational Stress Questionnaire    Feeling of Stress : Not at all  Social Connections: Unknown (07/16/2022)   Received from [facility], [facility]   Social Network    Social Network: Not on file    Current Medications: Current Facility-Administered Medications  Medication Dose Route Frequency Provider Last Rate Last Admin   acetaminophen  (TYLENOL ) tablet 650 mg  650 mg Oral Q6H PRN Trudy Carwin, NP   650 mg at 07/19/23 1234   alum & mag hydroxide-simeth (MAALOX/MYLANTA) 200-200-20 MG/5ML suspension 30 mL  30 mL Oral Q4H PRN Onuoha, Chinwendu V, NP   30 mL at 05/31/23 0801   atorvastatin  (LIPITOR) tablet 10 mg  10 mg Oral Daily Nwoko, Agnes I, NP   10 mg at 07/19/23 1731   busPIRone  (BUSPAR ) tablet 15 mg  15 mg Oral BID Nwoko, Agnes I, NP   15 mg at 07/20/23 0743   cyanocobalamin  (VITAMIN B12) injection 1,000 mcg  1,000 mcg Intramuscular Q30 days  Evelena Figures, MD   1,000 mcg at 07/03/23 1009   diphenhydrAMINE  (BENADRYL ) capsule 50 mg  50 mg Oral TID PRN Trudy Carwin, NP   50 mg at 06/03/23 1110   Or   diphenhydrAMINE  (BENADRYL ) injection 50 mg  50 mg Intramuscular TID PRN Trudy Carwin, NP       docusate sodium  (COLACE) capsule 100 mg  100 mg Oral Daily Nkwenti, Donia, NP   100 mg at 07/20/23 9256   haloperidol  (HALDOL ) tablet 5 mg  5 mg Oral TID PRN Collene Gouge I, NP   5 mg at 06/03/23 1110   Or   haloperidol  lactate (HALDOL ) injection 5 mg  5 mg Intramuscular TID PRN Collene Gouge I, NP       hydrocortisone  cream 1 %   Topical BID Ntuen, Tina C, FNP   1 Application at 07/19/23 0758   hydrOXYzine  (ATARAX ) tablet 25 mg  25 mg Oral TID PRN Nguyen, Julie, DO   25 mg at 07/07/23 0228   levothyroxine  (SYNTHROID ) tablet 25 mcg  25 mcg Oral Q0600 Kennyth Starleen RAMAN, MD   25  mcg at 07/20/23 9357   LORazepam  (ATIVAN ) tablet 2 mg  2 mg Oral TID PRN Trudy Carwin, NP   2 mg at 06/03/23 1110   Or   LORazepam  (ATIVAN ) injection 2 mg  2 mg Intramuscular TID PRN Trudy Carwin, NP       magnesium  hydroxide (MILK OF MAGNESIA) suspension 30 mL  30 mL Oral Daily PRN Trudy Carwin, NP   30 mL at 07/18/23 1701   melatonin tablet 5 mg  5 mg Oral QHS Nkwenti, Doris, NP   5 mg at 07/19/23 2013   nicotine  polacrilex (NICORETTE ) gum 2 mg  2 mg Oral PRN Goli, Veeraindar, MD   2 mg at 07/19/23 1236   paliperidone  (INVEGA ) 24 hr tablet 6 mg  6 mg Oral Daily Nkwenti, Doris, NP   6 mg at 07/19/23 2013   pantoprazole  (PROTONIX ) EC tablet 40 mg  40 mg Oral Daily Nwoko, Agnes I, NP   40 mg at 07/20/23 0743   polyethylene glycol (MIRALAX  / GLYCOLAX ) packet 17 g  17 g Oral Daily PRN Goli, Veeraindar, MD   17 g at 06/07/23 1847   selenium  sulfide (SELSUN ) 1 % shampoo   Topical Daily PRN Izediuno, Jerrell LABOR, MD       sertraline  (ZOLOFT ) tablet 150 mg  150 mg Oral Daily Massengill, Rankin, MD   150 mg at 07/20/23 9256   SUMAtriptan  (IMITREX ) tablet 25 mg  25 mg Oral BID PRN  Tex Drilling, NP   25 mg at 07/10/23 1642   traZODone  (DESYREL ) tablet 50 mg  50 mg Oral QHS Tex Drilling, NP   50 mg at 07/19/23 2013   Vitamin D  (Ergocalciferol ) (DRISDOL ) 1.25 MG (50000 UNIT) capsule 50,000 Units  50,000 Units Oral Q7 days Tex Drilling, NP   50,000 Units at 07/13/23 1731   Lab Results:  No results found for this or any previous visit (from the past 48 hours).  Blood Alcohol level:  Lab Results  Component Value Date   ETH <10 12/19/2022   ETH <10 11/21/2019   Metabolic Disorder Labs: Lab Results  Component Value Date   HGBA1C 5.6 06/16/2023   MPG 114.02 06/16/2023   MPG 79.58 12/21/2022   No results found for: PROLACTIN Lab Results  Component Value Date   CHOL 256 (H) 06/16/2023   TRIG 224 (H) 06/16/2023   HDL 83 06/16/2023   CHOLHDL 3.1 06/16/2023   VLDL 45 (H) 06/16/2023   LDLCALC 128 (H) 06/16/2023   LDLCALC 149 (H) 12/21/2022   Physical Findings: AIMS: Facial and Oral Movements Muscles of Facial Expression: None Lips and Perioral Area: None Jaw: None Tongue: None,Extremity Movements Upper (arms, wrists, hands, fingers): None Lower (legs, knees, ankles, toes): None, Trunk Movements Neck, shoulders, hips: None, Global Judgements Severity of abnormal movements overall : None Incapacitation due to abnormal movements: None Patient's awareness of abnormal movements: No Awareness, Dental Status Current problems with teeth and/or dentures?: No Does patient usually wear dentures?: No  CIWA:    COWS:     Musculoskeletal: Strength & Muscle Tone: within normal limits Gait & Station: normal Patient leans: N/A  Psychiatric Specialty Exam:  Presentation  General Appearance:  Casually dressed, overweight, not in any distress.  No EPS. Eye Contact: Good  Speech: Slurred and slow.  Speech Volume: At baseline.  Mood and Affect  Mood: Depressed but not pervasively so. Affect: Blunted and mood congruent.  Thought Process  Thought  Processes: Decreased speed of thoughts but goal-directed.  Descriptions of  Associations:  Linear.  Orientation:Full (Time, Place and Person)  Thought Content: Negative ruminations about early life events.  Negative rumination about being in the hospital during the holidays.  No active suicidal thoughts.  No homicidal thoughts.  No thoughts of violence.  No delusional preoccupation.   Hallucinations: No hallucination in any modality.   Sensorium  Memory: Immediate Good; Remote Fair; Recent Good  Judgment: Fair  Insight: Fair   Executive Functions  Concentration: Good  Attention Span: Good  Recall: [place] of Knowledge: Fair  Language: Fair   Psychomotor Activity  Psychomotor Activity: Psychomotor Activity: Normal   Physical Exam: Physical Exam HENT:     Head: Normocephalic.     Nose: Nose normal.     Mouth/Throat:     Pharynx: Oropharynx is clear.  Eyes:     Conjunctiva/sclera: Conjunctivae normal.  Cardiovascular:     Pulses: Normal pulses.  Pulmonary:     Effort: No respiratory distress.  Genitourinary:    Comments: Deferred Musculoskeletal:        General: Normal range of motion.     Cervical back: Normal range of motion.  Skin:    General: Skin is dry.  Neurological:     General: No focal deficit present.     Mental Status: She is alert and oriented to person, place, and time.    Review of Systems  Constitutional:  Negative for chills, diaphoresis and fever.  HENT:  Negative for congestion, hearing loss and sore throat.   Eyes:  Negative for discharge and redness.  Respiratory:  Negative for cough and shortness of breath.   Cardiovascular:  Negative for chest pain and palpitations.  Gastrointestinal:  Negative for abdominal pain, constipation, diarrhea, heartburn, nausea and vomiting.  Musculoskeletal:  Negative for falls.  Neurological:  Negative for dizziness, tingling, tremors, sensory change, focal weakness, loss of  consciousness and headaches.  Psychiatric/Behavioral:  Negative for hallucinations, memory loss, substance abuse and suicidal ideas. The patient does not have insomnia.    Blood pressure 100/67, pulse 87, temperature 97.6 F (36.4 C), temperature source Oral, resp. rate 16, height 4' 11 (1.499 m), weight 63 kg, SpO2 97%. Body mass index is 28.05 kg/m.  Treatment Plan Summary: The patient appears to be at her baseline. She is tolerating her medications well with no reported or observed side effects. There are no behavioral concerns at this time. Will continue her current medication regimen, and daily assessments as we continue to await appropriate discharge planning.   1.  Continue Invega  ER 6 mg at bedtime. 2.  Continue sertraline  150 mg daily. 3.  Continue trazodone  50 mg as needed for insomnia. 4.  Continue Buspar  15 mg po bid for anxiety. 5.  Continue Hydroxyzine  25 mg po tid prn for anxiety. 6.  Continue Melatonin 5 mg po q bedtime for sleep. 7.  Continue Miralax  17 gm po daily prn for constipation. 8.  Continue Vitamin D1.25 (50,000 units daily for bone health. 5.  Continue to monitor mood behavior and interaction with others. 5.  Continue to encourage unit groups and therapeutic activities.  Blair Chiquita Hint, NP, pmhnp, fnp-bc. 07/20/2023, 12:21 PM Patient ID: Yolanda [name], female   DOB: 1975/09/13, 48 y.o.   MRN: [id number] Patient ID: Yolanda [name], female   DOB: 11-09-1975, 48 y.o.   MRN: [id number] Patient ID: Yolanda [name], female   DOB: [birth year], 48 y.o.   MRN: [id number] Patient ID: Yolanda [name], female   DOB: 18-Jun-1976, 48 y.o.  MRN: [id number]

## 2023-07-21 DIAGNOSIS — F251 Schizoaffective disorder, depressive type: Secondary | ICD-10-CM | POA: Diagnosis not present

## 2023-07-21 MED ORDER — SODIUM CHLORIDE 0.9 % IN NEBU
INHALATION_SOLUTION | RESPIRATORY_TRACT | Status: AC
  Administered 2023-07-21: 2 mL
  Filled 2023-07-21: qty 3

## 2023-07-21 NOTE — Progress Notes (Signed)
   07/21/23 1500  Psych Admission Type (Psych Patients Only)  Admission Status Involuntary  Psychosocial Assessment  Patient Complaints Depression  Eye Contact Brief  Facial Expression Sad  Affect Depressed  Speech Logical/coherent  Interaction Assertive  Motor Activity Fidgety  Appearance/Hygiene Disheveled  Behavior Characteristics Cooperative  Mood Depressed;Pleasant  Thought Process  Coherency WDL  Content WDL  Delusions None reported or observed  Perception WDL  Hallucination None reported or observed  Judgment Limited  Confusion None  Danger to Self  Current suicidal ideation? Denies  Self-Injurious Behavior No self-injurious ideation or behavior indicators observed or expressed

## 2023-07-21 NOTE — [facility] Group Notes (Signed)
 Pt was encouraged but refused to attend wrap up group

## 2023-07-21 NOTE — [facility] Group Notes (Signed)
Pt participated in group.

## 2023-07-21 NOTE — Progress Notes (Addendum)
 [facility] MD Progress Note  07/21/2023 11:08 AM Yolanda [name]  MRN:  [id number]    Reason for Admission:  Per chart review: Yolanda [name] is a 48 year old African-American female, single, on SSI disability, homeless.  Background history of intellectual disability, substance use disorder and schizoaffective disorder depressive type.  Presented via emergency services on account of worsening depression associated with suicidal thoughts. Expressed thoughts of suicide by stabbing herself.   Chart Review from last 24 hours:  The patient's chart and nursing notes were reviewed. The patient's case was discussed in multidisciplinary team meeting. Vital signs within defined limits. Patient is compliant with routine medication regimen without difficulty. Sleep hours last night: 6.25 hours, as documented in the nursing flow sheets. No behavioral episodes or nursing concerns were reported at this time. No PRN medication was required, according to the nursing record.    Information Obtained Today During Patient Interview: The patient was seen face to face by this provider. Chart reviewed on 07/21/23 and case staffed with attending psychiatrist, A. Pashayan. The patient was observed lying in bed awake, with crossword puzzles and coloring pages spread in front of her. She describes her mood as okay.  She rates her depression and anxiety as 10/10 (with 10 being the most severe). She denies suicidal or homicidal ideation as well as auditory or visual hallucinations. However, she reported experiencing some paranoia, specifically a feeling of being watched and followed. She denies delusional thought content. She reports difficulty both falling asleep and staying asleep the previous night. Her appetite was reported as satisfactory.  The patient states that she attended groups yesterday and found them beneficial. She became tearful when asked about a statement reportedly made to the night nurse about her depression  worsening. The patient's worsening depression and anxiety is implied to be due to her current living situation and the uncertainty surrounding her placement. She appeared engaged and receptive to spiritual encouragement, reassurance, and active listening provided during the interview.     The patient's psychiatric symptoms remain stable. She is currently awaiting appropriate living arrangements being coordinated by Social Work.   Principal Problem: Schizoaffective disorder, depressive type (HCC) Diagnosis: Principal Problem:   Schizoaffective disorder, depressive type (HCC) Active Problems:   GERD (gastroesophageal reflux disease)   Intellectual disability   Tobacco use disorder  Total Time spent with patient: 20 minutes  Past Psychiatric History: Extensive history of mental illness.  She has been tried on multiple medications over the years.  See H&P for full details.  Past Medical History:  Past Medical History:  Diagnosis Date   Bipolar affect, depressed (HCC)    Constipation 08/17/2022   Depression    Falls 07/21/2022   Fracture of femoral neck, right, closed (HCC) 01/17/2022   Herpes simplex 08/22/2017   Open fracture dislocation of right elbow joint 01/17/2022    Past Surgical History:  Procedure Laterality Date   NO PAST SURGERIES     SALPINGECTOMY     Family History: History reviewed. No pertinent family history. Family Psychiatric  History: Extensive family history of mood disorder. Social History:  Social History   Substance and Sexual Activity  Alcohol Use Yes     Social History   Substance and Sexual Activity  Drug Use Yes   Types: Cocaine, Marijuana    Social History   Socioeconomic History   Marital status: Single    Spouse name: Not on file   Number of children: Not on file   Years of education: Not on  file   Highest education level: Not on file  Occupational History   Not on file  Tobacco Use   Smoking status: Every Day   Smokeless tobacco:  Not on file  Substance and Sexual Activity   Alcohol use: Yes   Drug use: Yes    Types: Cocaine, Marijuana   Sexual activity: Yes  Other Topics Concern   Not on file  Social History Narrative   Not on file   Social Drivers of Health   Financial Resource Strain: Low Risk  (09/12/2022)   Received from [facility], [facility]   Overall Financial Resource Strain (CARDIA)    Difficulty of Paying Living Expenses: Not hard at all  Food Insecurity: Patient Declined (12/20/2022)   Hunger Vital Sign    Worried About Running Out of Food in the Last Year: Patient declined    Ran Out of Food in the Last Year: Patient declined  Transportation Needs: No Transportation Needs (12/20/2022)   PRAPARE - Administrator, Civil Service (Medical): No    Lack of Transportation (Non-Medical): No  Physical Activity: Not on file  Stress: No Stress Concern Present (07/17/2022)   Received from [facility], [facility] - Occupational Stress Questionnaire    Feeling of Stress : Not at all  Social Connections: Unknown (07/16/2022)   Received from [facility], [facility]   Social Network    Social Network: Not on file    Current Medications: Current Facility-Administered Medications  Medication Dose Route Frequency Provider Last Rate Last Admin   acetaminophen  (TYLENOL ) tablet 650 mg  650 mg Oral Q6H PRN Trudy Carwin, NP   650 mg at 07/19/23 1234   alum & mag hydroxide-simeth (MAALOX/MYLANTA) 200-200-20 MG/5ML suspension 30 mL  30 mL Oral Q4H PRN Onuoha, Chinwendu V, NP   30 mL at 05/31/23 0801   atorvastatin  (LIPITOR) tablet 10 mg  10 mg Oral Daily Nwoko, Agnes I, NP   10 mg at 07/20/23 1713   busPIRone  (BUSPAR ) tablet 15 mg  15 mg Oral BID Nwoko, Agnes I, NP   15 mg at 07/21/23 9170   cyanocobalamin  (VITAMIN B12) injection 1,000 mcg  1,000 mcg Intramuscular Q30 days Evelena Figures, MD   1,000 mcg at 07/03/23 1009   diphenhydrAMINE  (BENADRYL )  capsule 50 mg  50 mg Oral TID PRN Trudy Carwin, NP   50 mg at 06/03/23 1110   Or   diphenhydrAMINE  (BENADRYL ) injection 50 mg  50 mg Intramuscular TID PRN Trudy Carwin, NP       docusate sodium  (COLACE) capsule 100 mg  100 mg Oral Daily Nkwenti, Doris, NP   100 mg at 07/21/23 9170   haloperidol  (HALDOL ) tablet 5 mg  5 mg Oral TID PRN Collene Gouge I, NP   5 mg at 06/03/23 1110   Or   haloperidol  lactate (HALDOL ) injection 5 mg  5 mg Intramuscular TID PRN Collene Gouge I, NP       hydrocortisone  cream 1 %   Topical BID Ntuen, Tina C, FNP   1 Application at 07/19/23 0758   hydrOXYzine  (ATARAX ) tablet 25 mg  25 mg Oral TID PRN Nguyen, Julie, DO   25 mg at 07/07/23 0228   levothyroxine  (SYNTHROID ) tablet 25 mcg  25 mcg Oral Q0600 Parker, Alvin S, MD   25 mcg at 07/21/23 0600   LORazepam  (ATIVAN ) tablet 2 mg  2 mg Oral TID PRN Trudy Carwin, NP   2 mg  at 06/03/23 1110   Or   LORazepam  (ATIVAN ) injection 2 mg  2 mg Intramuscular TID PRN Trudy Carwin, NP       magnesium  hydroxide (MILK OF MAGNESIA) suspension 30 mL  30 mL Oral Daily PRN Trudy Carwin, NP   30 mL at 07/18/23 1701   melatonin tablet 5 mg  5 mg Oral QHS Nkwenti, Doris, NP   5 mg at 07/20/23 2100   nicotine  polacrilex (NICORETTE ) gum 2 mg  2 mg Oral PRN Goli, Veeraindar, MD   2 mg at 07/19/23 1236   paliperidone  (INVEGA ) 24 hr tablet 6 mg  6 mg Oral Daily Nkwenti, Doris, NP   6 mg at 07/20/23 2100   pantoprazole  (PROTONIX ) EC tablet 40 mg  40 mg Oral Daily Nwoko, Agnes I, NP   40 mg at 07/21/23 0829   polyethylene glycol (MIRALAX  / GLYCOLAX ) packet 17 g  17 g Oral Daily PRN Goli, Veeraindar, MD   17 g at 06/07/23 1847   selenium  sulfide (SELSUN ) 1 % shampoo   Topical Daily PRN Izediuno, Vincent A, MD   Given at 07/20/23 2218   sertraline  (ZOLOFT ) tablet 150 mg  150 mg Oral Daily Massengill, Rankin, MD   150 mg at 07/21/23 9170   SUMAtriptan  (IMITREX ) tablet 25 mg  25 mg Oral BID PRN Tex Drilling, NP   25 mg at 07/10/23 1642   traZODone   (DESYREL ) tablet 50 mg  50 mg Oral QHS Tex Drilling, NP   50 mg at 07/20/23 2100   Vitamin D  (Ergocalciferol ) (DRISDOL ) 1.25 MG (50000 UNIT) capsule 50,000 Units  50,000 Units Oral Q7 days Tex Drilling, NP   50,000 Units at 07/20/23 1712   Lab Results:  No results found for this or any previous visit (from the past 48 hours).  Blood Alcohol level:  Lab Results  Component Value Date   ETH <10 12/19/2022   ETH <10 11/21/2019   Metabolic Disorder Labs: Lab Results  Component Value Date   HGBA1C 5.6 06/16/2023   MPG 114.02 06/16/2023   MPG 79.58 12/21/2022   No results found for: PROLACTIN Lab Results  Component Value Date   CHOL 256 (H) 06/16/2023   TRIG 224 (H) 06/16/2023   HDL 83 06/16/2023   CHOLHDL 3.1 06/16/2023   VLDL 45 (H) 06/16/2023   LDLCALC 128 (H) 06/16/2023   LDLCALC 149 (H) 12/21/2022   Physical Findings: AIMS: Facial and Oral Movements Muscles of Facial Expression: None Lips and Perioral Area: None Jaw: None Tongue: None,Extremity Movements Upper (arms, wrists, hands, fingers): None Lower (legs, knees, ankles, toes): None, Trunk Movements Neck, shoulders, hips: None, Global Judgements Severity of abnormal movements overall : None Incapacitation due to abnormal movements: None Patient's awareness of abnormal movements: No Awareness, Dental Status Current problems with teeth and/or dentures?: No Does patient usually wear dentures?: No  CIWA:    COWS:     Musculoskeletal: Strength & Muscle Tone: within normal limits Gait & Station: normal Patient leans: N/A  Psychiatric Specialty Exam:  Presentation  General Appearance:  Casually dressed, overweight, not in any distress.  No EPS. Eye Contact: Fair  Speech: Slurred and slow.  Speech Volume: At baseline.  Mood and Affect  Mood: Depressed but not pervasively so. Affect: Blunted and mood congruent.  Thought Process  Thought Processes: Decreased speed of thoughts but  goal-directed.  Descriptions of Associations:  Linear.  Orientation:Full (Time, Place and Person)  Thought Content: Negative ruminations about early life events.  Negative rumination about  being in the hospital during the holidays.  No active suicidal thoughts.  No homicidal thoughts.  No thoughts of violence.  No delusional preoccupation.   Hallucinations: No hallucination in any modality.   Sensorium  Memory: Immediate Good; Recent Fair  Judgment: Fair  Insight: Fair   Executive Functions  Concentration: Good  Attention Span: Good  Recall: [place] of Knowledge: Fair  Language: Fair   Psychomotor Activity  Psychomotor Activity: Psychomotor Activity: Normal   Physical Exam: Physical Exam HENT:     Head: Normocephalic.     Nose: Nose normal.     Mouth/Throat:     Pharynx: Oropharynx is clear.  Eyes:     Conjunctiva/sclera: Conjunctivae normal.  Cardiovascular:     Pulses: Normal pulses.  Pulmonary:     Effort: No respiratory distress.  Genitourinary:    Comments: Deferred Musculoskeletal:        General: Normal range of motion.     Cervical back: Normal range of motion.  Skin:    General: Skin is dry.  Neurological:     General: No focal deficit present.     Mental Status: She is alert and oriented to person, place, and time.    Review of Systems  Constitutional:  Negative for chills, diaphoresis and fever.  HENT:  Negative for congestion, hearing loss and sore throat.   Eyes:  Negative for discharge and redness.  Respiratory:  Negative for cough and shortness of breath.   Cardiovascular:  Negative for chest pain and palpitations.  Gastrointestinal:  Negative for abdominal pain, constipation, diarrhea, heartburn, nausea and vomiting.  Musculoskeletal:  Negative for falls.  Neurological:  Negative for dizziness, tingling, tremors, sensory change, focal weakness, loss of consciousness and headaches.  Psychiatric/Behavioral:  Negative  for hallucinations, memory loss, substance abuse and suicidal ideas. The patient does not have insomnia.    Blood pressure 100/67, pulse 87, temperature 97.6 F (36.4 C), temperature source Oral, resp. rate 16, height 4' 11 (1.499 m), weight 63 kg, SpO2 97%. Body mass index is 28.05 kg/m.  Treatment Plan Summary: The patient appears to be at her baseline. She is tolerating her medications well with no reported or observed side effects. There are no behavioral concerns at this time. Will continue her current medication regimen, and daily assessments as we continue to await appropriate discharge planning.   1.  Continue Invega  ER 6 mg at bedtime. 2.  Continue sertraline  150 mg daily. 3.  Continue trazodone  50 mg as needed for insomnia. 4.  Continue Buspar  15 mg po bid for anxiety. 5.  Continue Hydroxyzine  25 mg po tid prn for anxiety. 6.  Continue Melatonin 5 mg po q bedtime for sleep. 7.  Continue Miralax  17 gm po daily prn for constipation. 8.  Continue Vitamin D1.25 (50,000 units daily for bone health. 5.  Continue to monitor mood behavior and interaction with others. 5.  Continue to encourage unit groups and therapeutic activities.  Blair Chiquita Hint, NP, pmhnp, fnp-bc. 07/21/2023, 11:08 AM Patient ID: Yolanda [name], female   DOB: 22-Feb-1976, 48 y.o.   MRN: [id number] Patient ID: Yolanda [name], female   DOB: 07/08/1976, 48 y.o.   MRN: [id number] Patient ID: Yolanda [name], female   DOB: 1976-01-21, 48 y.o.   MRN: [id number] Patient ID: Yolanda [name], female   DOB: September 25, 1975, 48 y.o.   MRN: [id number] Patient ID: Yolanda [name], female   DOB: [birth year], 48 y.o.   MRN: [id number]

## 2023-07-21 NOTE — Progress Notes (Signed)
   07/20/23 2000  Psychosocial Assessment  Patient Complaints Anxiety;Depression (Rates depression 7/10 and anxiety 8/10 with 10 being the most)  Eye Contact Brief  Facial Expression Anxious  Affect Anxious;Depressed  Speech Logical/coherent  Interaction Assertive  Motor Activity Fidgety  Appearance/Hygiene Disheveled;Poor hygiene  Behavior Characteristics Cooperative  Mood Depressed;Anxious;Pleasant  Thought Process  Coherency WDL  Content WDL  Delusions None reported or observed  Perception WDL  Hallucination None reported or observed  Judgment Limited  Confusion None  Danger to Self  Current suicidal ideation? Denies  Self-Injurious Behavior No self-injurious ideation or behavior indicators observed or expressed   Danger to Others  Danger to Others None reported or observed  Danger to Others Abnormal  Harmful Behavior to others No threats or harm toward other people  Destructive Behavior No threats or harm toward property

## 2023-07-21 NOTE — [facility] Group Notes (Signed)
 Type of Therapy and Topic:  Group Therapy:  Boundaries  Participation Level:  Did Not Attend   Description of Group:  Patients in this group were introduced to 7 types of boundaries to address the extent to which we allow others into our lives, how we communicate, and where we draw the line when it comes to our personal space, emotions, and needs.  Different types of boundaries were defined and described, and each type was acted out.  Patients discussed what additional healthy boundaries could be helpful in their recovery and wellness after discharge in order to prevent future hospitalizations.   An emphasis was placed on following up with the discharge plan when they leave the hospital in order to continue becoming healthier and happier.  Two songs were played during group to help further patients' understanding.  Therapeutic Goals: 1)  demonstrate and identify the type of unhealthy and healthy boundaries  2)  discuss healthy boundaries and how to address it  3)  identify the patient's current level of healthy boundaries and   4)  elicit communicating healthy boundaries and trust    Summary of Patient Progress:  NA  Therapeutic Modalities:   Psychoeducation Brief Solution-Focused Therapy

## 2023-07-21 NOTE — BHH Group Notes (Signed)
Pt did not attend goals group. 

## 2023-07-21 NOTE — Plan of Care (Signed)
   Problem: Education: Goal: Ability to make informed decisions regarding treatment will improve Outcome: Progressing   Problem: Coping: Goal: Coping ability will improve Outcome: Progressing

## 2023-07-22 ENCOUNTER — Encounter (HOSPITAL_COMMUNITY): Payer: Self-pay

## 2023-07-22 DIAGNOSIS — F251 Schizoaffective disorder, depressive type: Secondary | ICD-10-CM | POA: Diagnosis not present

## 2023-07-22 LAB — TSH: TSH: 5.782 u[IU]/mL — ABNORMAL HIGH (ref 0.350–4.500)

## 2023-07-22 LAB — T4, FREE: Free T4: 0.56 ng/dL — ABNORMAL LOW (ref 0.61–1.12)

## 2023-07-22 NOTE — [facility] Group Notes (Signed)
 Pt did not attend AA meeting group

## 2023-07-22 NOTE — Progress Notes (Signed)
 CSW spoke with Adelaide Dawn Adult home owner to discuss pt potentially moving into one of her Adult homes. Miss Dawn requested a meeting with pt, which has been scheduled for 07/23/2023 at 11:00 am.  Mtg will be held via WebEx. Miss Dawn aware of pt's incontinence and requested a trial of pt wearing Depends overnight. CSW spoke with Team and pt regarding request. Pt is in agreement of trial. CSW will continue to follow and update team.

## 2023-07-22 NOTE — Discharge Planning (Signed)
 LCSW spoke with Group Home Representative Adrien Essex at [facility] regarding updates. Per [name], she is just waiting for an update from [facility] as they have expedited the process and is hopeful to hear back within the next day or so. [name] reports due to the process moving quickly, if the patient would feel more comfortable then she is willing to come and meet the patient or complete a zoom call as needed. Mrs. Noella earliest availability for zoom call would be Thursday 1/16, however reports if the patient is approved and accepted by then she will see her the day of admission. [name] reports she will follow up with the patient's Care Manager to explore any updates she may have, and then will follow back up once updates can be provided. No other needs were reported at this time.   Update has been provided to the team. LCSW will continue to follow and provide support to patient.   Merlynn Lazier, LCSW Clinical Social Worker [facility] Ph: [phone]

## 2023-07-22 NOTE — Progress Notes (Signed)
   07/22/23 0900  Psych Admission Type (Psych Patients Only)  Admission Status Involuntary  Psychosocial Assessment  Patient Complaints Anxiety;Worrying  Eye Contact Brief  Facial Expression Worried  Affect Anxious  Speech Logical/coherent;Soft  Interaction Childlike  Motor Activity Tremors;Slow  Appearance/Hygiene Poor hygiene  Behavior Characteristics Cooperative  Mood Depressed;Anxious  Thought Process  Coherency WDL  Content Blaming self  Delusions None reported or observed  Perception WDL  Hallucination None reported or observed  Judgment WDL  Confusion None  Danger to Self  Current suicidal ideation? Denies  Agreement Not to Harm Self Yes  Description of Agreement Verbla  Danger to Others  Danger to Others None reported or observed

## 2023-07-22 NOTE — Plan of Care (Signed)
  Problem: Coping: Goal: Coping ability will improve Outcome: Progressing   Problem: Coping: Goal: Coping ability will improve 07/22/2023 1011 by Thersa Salt, RN Outcome: Progressing 07/22/2023 1011 by Thersa Salt, RN Outcome: Progressing

## 2023-07-22 NOTE — Progress Notes (Addendum)
 [facility] MD Progress Note  07/22/2023 12:23 PM Yolanda [name]  MRN:  [id number] HPI:   48 year old African-American female, single, on SSI disability, homeless.  Background history of intellectual disability, substance use disorder and schizoaffective disorder depressive type.  Presented via emergency services on account of worsening depression associated with suicidal thoughts.  Expressed thoughts of suicide by stabbing herself.  24 Hour chart review:  Pt remains compliant with scheduled meds, slept through the night as per nursing reports, no PRN meds overnight. V/S are stable. CSW is making progress towards placing pt in a group home as per documentation in the past 24 hrs.  Patient assessment: Pt continues to present with a flat affect and depressed mood, & her attention to personal hygiene and grooming is fair. Eye contact is fair, speech is clear & coherent. Thought contents are organized and logical, and pt currently denies SI/HI/AVH or paranoia. There is no evidence of delusional thoughts.    From a mental health standpoint, pt is clear for discharge, but we are continuing to await CSW coordinating with CSW to place pt in a structured living environment, as she is unable to function independently in the community due to her cognitive impairments.  CSW inquired about pt's substance abuse background. As per chart review, pt has a history of cocaine abuse in her remote past, dating back to her 29s, but she has had sustained remission  for at least 10 yrs now. Nursing has been educated to implement a bladder program for pt whereby she is encouraged nightly to wear incontinent briefs as she tends to have bladder control issues intermittently. We will monitor how she does with this. She is currently receptive to this.  We are keeping all meds as currently ordered with no changes being completed today, and we will continue to monitor.  Labs Reviewed: FT3 & FT4 were slightly subtherapeutic 2 weeks  ago at 1.9 & 0.56 respectively. Will repeat.   Principal Problem: Schizoaffective disorder, depressive type (HCC) Diagnosis: Principal Problem:   Schizoaffective disorder, depressive type (HCC) Active Problems:   GERD (gastroesophageal reflux disease)   Intellectual disability   Tobacco use disorder  Total Time spent with patient: 20 minutes  Past Psychiatric History: Extensive history of mental illness.  She has been tried on multiple medications over the years.  See H&P for full details.  Past Medical History:  Past Medical History:  Diagnosis Date   Bipolar affect, depressed (HCC)    Constipation 08/17/2022   Depression    Falls 07/21/2022   Fracture of femoral neck, right, closed (HCC) 01/17/2022   Herpes simplex 08/22/2017   Open fracture dislocation of right elbow joint 01/17/2022    Past Surgical History:  Procedure Laterality Date   NO PAST SURGERIES     SALPINGECTOMY     Family History: History reviewed. No pertinent family history. Family Psychiatric  History: Extensive family history of mood disorder. Social History:  Social History   Substance and Sexual Activity  Alcohol Use Yes     Social History   Substance and Sexual Activity  Drug Use Yes   Types: Cocaine, Marijuana    Social History   Socioeconomic History   Marital status: Single    Spouse name: Not on file   Number of children: Not on file   Years of education: Not on file   Highest education level: Not on file  Occupational History   Not on file  Tobacco Use   Smoking status: Every Day  Smokeless tobacco: Not on file  Substance and Sexual Activity   Alcohol use: Yes   Drug use: Yes    Types: Cocaine, Marijuana   Sexual activity: Yes  Other Topics Concern   Not on file  Social History Narrative   Not on file   Social Drivers of Health   Financial Resource Strain: Low Risk  (09/12/2022)   Received from [facility], [facility]   Overall Financial Resource Strain (CARDIA)     Difficulty of Paying Living Expenses: Not hard at all  Food Insecurity: Patient Declined (12/20/2022)   Hunger Vital Sign    Worried About Running Out of Food in the Last Year: Patient declined    Ran Out of Food in the Last Year: Patient declined  Transportation Needs: No Transportation Needs (12/20/2022)   PRAPARE - Administrator, Civil Service (Medical): No    Lack of Transportation (Non-Medical): No  Physical Activity: Not on file  Stress: No Stress Concern Present (07/17/2022)   Received from [facility], [facility] of Occupational Health - Occupational Stress Questionnaire    Feeling of Stress : Not at all  Social Connections: Unknown (07/16/2022)   Received from [facility], [facility]   Social Network    Social Network: Not on file    Current Medications: Current Facility-Administered Medications  Medication Dose Route Frequency Provider Last Rate Last Admin   acetaminophen  (TYLENOL ) tablet 650 mg  650 mg Oral Q6H PRN Trudy Carwin, NP   650 mg at 07/19/23 1234   alum & mag hydroxide-simeth (MAALOX/MYLANTA) 200-200-20 MG/5ML suspension 30 mL  30 mL Oral Q4H PRN Onuoha, Chinwendu V, NP   30 mL at 05/31/23 0801   atorvastatin  (LIPITOR) tablet 10 mg  10 mg Oral Daily Nwoko, Agnes I, NP   10 mg at 07/21/23 1712   busPIRone  (BUSPAR ) tablet 15 mg  15 mg Oral BID Nwoko, Agnes I, NP   15 mg at 07/22/23 0935   cyanocobalamin  (VITAMIN B12) injection 1,000 mcg  1,000 mcg Intramuscular Q30 days Evelena Figures, MD   1,000 mcg at 07/03/23 1009   diphenhydrAMINE  (BENADRYL ) capsule 50 mg  50 mg Oral TID PRN Trudy Carwin, NP   50 mg at 06/03/23 1110   Or   diphenhydrAMINE  (BENADRYL ) injection 50 mg  50 mg Intramuscular TID PRN Trudy Carwin, NP       docusate sodium  (COLACE) capsule 100 mg  100 mg Oral Daily Marny Smethers, Donia, NP   100 mg at 07/22/23 9063   haloperidol  (HALDOL ) tablet 5 mg  5 mg Oral TID PRN Collene Gouge I, NP   5 mg at 06/03/23 1110   Or    haloperidol  lactate (HALDOL ) injection 5 mg  5 mg Intramuscular TID PRN Collene Gouge I, NP       hydrocortisone  cream 1 %   Topical BID Ntuen, Tina C, FNP   Given at 07/22/23 9063   hydrOXYzine  (ATARAX ) tablet 25 mg  25 mg Oral TID PRN Nguyen, Julie, DO   25 mg at 07/07/23 0228   levothyroxine  (SYNTHROID ) tablet 25 mcg  25 mcg Oral Q0600 Parker, Alvin S, MD   25 mcg at 07/22/23 9346   LORazepam  (ATIVAN ) tablet 2 mg  2 mg Oral TID PRN Trudy Carwin, NP   2 mg at 06/03/23 1110   Or   LORazepam  (ATIVAN ) injection 2 mg  2 mg Intramuscular TID PRN Trudy Carwin, NP       magnesium   hydroxide (MILK OF MAGNESIA) suspension 30 mL  30 mL Oral Daily PRN Trudy Carwin, NP   30 mL at 07/18/23 1701   melatonin tablet 5 mg  5 mg Oral QHS Kalei Meda, NP   5 mg at 07/21/23 2107   nicotine  polacrilex (NICORETTE ) gum 2 mg  2 mg Oral PRN Goli, Veeraindar, MD   2 mg at 07/19/23 1236   paliperidone  (INVEGA ) 24 hr tablet 6 mg  6 mg Oral Daily Gianina Olinde, NP   6 mg at 07/21/23 2107   pantoprazole  (PROTONIX ) EC tablet 40 mg  40 mg Oral Daily Nwoko, Agnes I, NP   40 mg at 07/22/23 0936   polyethylene glycol (MIRALAX  / GLYCOLAX ) packet 17 g  17 g Oral Daily PRN Goli, Veeraindar, MD   17 g at 06/07/23 1847   selenium  sulfide (SELSUN ) 1 % shampoo   Topical Daily PRN Izediuno, Vincent A, MD   Given at 07/20/23 2218   sertraline  (ZOLOFT ) tablet 150 mg  150 mg Oral Daily Massengill, Rankin, MD   150 mg at 07/22/23 9063   SUMAtriptan  (IMITREX ) tablet 25 mg  25 mg Oral BID PRN Tex Drilling, NP   25 mg at 07/10/23 1642   traZODone  (DESYREL ) tablet 50 mg  50 mg Oral QHS Tex Drilling, NP   50 mg at 07/21/23 2107   Vitamin D  (Ergocalciferol ) (DRISDOL ) 1.25 MG (50000 UNIT) capsule 50,000 Units  50,000 Units Oral Q7 days Tex Drilling, NP   50,000 Units at 07/20/23 1712   Lab Results:  No results found for this or any previous visit (from the past 48 hours).  Blood Alcohol level:  Lab Results  Component Value Date    ETH <10 12/19/2022   ETH <10 11/21/2019   Metabolic Disorder Labs: Lab Results  Component Value Date   HGBA1C 5.6 06/16/2023   MPG 114.02 06/16/2023   MPG 79.58 12/21/2022   No results found for: PROLACTIN Lab Results  Component Value Date   CHOL 256 (H) 06/16/2023   TRIG 224 (H) 06/16/2023   HDL 83 06/16/2023   CHOLHDL 3.1 06/16/2023   VLDL 45 (H) 06/16/2023   LDLCALC 128 (H) 06/16/2023   LDLCALC 149 (H) 12/21/2022   Physical Findings: AIMS: Facial and Oral Movements Muscles of Facial Expression: None Lips and Perioral Area: None Jaw: None Tongue: None,Extremity Movements Upper (arms, wrists, hands, fingers): None Lower (legs, knees, ankles, toes): None, Trunk Movements Neck, shoulders, hips: None, Global Judgements Severity of abnormal movements overall : None Incapacitation due to abnormal movements: None Patient's awareness of abnormal movements: No Awareness, Dental Status Current problems with teeth and/or dentures?: No Does patient usually wear dentures?: No  CIWA:    COWS:     Musculoskeletal: Strength & Muscle Tone: within normal limits Gait & Station: normal Patient leans: N/A  Psychiatric Specialty Exam:  Presentation  General Appearance:  Casually dressed, overweight, not in any distress.  No EPS. Eye Contact: Fair  Speech: Slurred and slow.  Speech Volume: At baseline.  Mood and Affect  Mood: Depressed but not pervasively so. Affect: Blunted and mood congruent.  Thought Process  Thought Processes: Decreased speed of thoughts but goal-directed.  Descriptions of Associations:  Linear.  Orientation:Full (Time, Place and Person)  Thought Content: WNL  Hallucinations: No hallucination in any modality.   Sensorium  Memory: Immediate Fair  Judgment: Fair  Insight: Fair   Chartered Certified Accountant: Fair  Attention Span: Fair  Recall: [employer] of Knowledge: Fair  Language: Fair   Psychomotor  Activity  Psychomotor Activity: Psychomotor Activity: Normal    Physical Exam: Physical Exam HENT:     Nose: Nose normal.     Mouth/Throat:     Pharynx: Oropharynx is clear.  Cardiovascular:     Rate and Rhythm: Normal rate.     Pulses: Normal pulses.  Pulmonary:     Effort: Pulmonary effort is normal.  Genitourinary:    Comments: Deferred Musculoskeletal:     Cervical back: Normal range of motion.  Skin:    General: Skin is warm and dry.  Neurological:     General: No focal deficit present.     Mental Status: She is alert and oriented to person, place, and time.  Psychiatric:        Mood and Affect: Mood normal.        Behavior: Behavior normal.        Thought Content: Thought content normal.        Judgment: Judgment normal.    Review of Systems  Constitutional:  Negative for chills, diaphoresis and fever.  HENT:  Negative for congestion and sore throat.   Respiratory:  Negative for cough, shortness of breath and wheezing.   Cardiovascular:  Negative for chest pain and palpitations.  Gastrointestinal:  Negative for abdominal pain, diarrhea, heartburn, nausea and vomiting.  Musculoskeletal:  Negative for joint pain and myalgias.  Neurological:  Negative for dizziness, tingling, tremors, sensory change, speech change, focal weakness, seizures, loss of consciousness, weakness and headaches.  Psychiatric/Behavioral:  Positive for depression (stable). Negative for hallucinations, memory loss, substance abuse and suicidal ideas. The patient is nervous/anxious (Stable) and has insomnia (Stable).    Blood pressure 100/67, pulse 87, temperature 97.6 F (36.4 C), temperature source Oral, resp. rate 16, height 4' 11 (1.499 m), weight 63 kg, SpO2 97%. Body mass index is 28.05 kg/m.  Treatment Plan Summary: Currently mentally stable for discharge.  Medications: 1.  Continue Invega  ER 6 mg at bedtime. 2.  Continue sertraline  150 mg daily. 3.  Continue trazodone  50 mg as  needed for insomnia. 4.  Continue Buspar  15 mg po bid for anxiety. 5.  Continue Hydroxyzine  25 mg po tid prn for anxiety. 6.  Continue Melatonin 5 mg po q bedtime for sleep. 7.  Continue Miralax  17 gm po daily prn for constipation. 8.  Continue Vitamin D1.25 (50,000 units daily for bone health.  CSW is continuing to coordinate discharge planning with DSS. We are continuing Q15 minute checks & encouraging participating in group activities.   Donia Snell, NP, pmhnp. 07/22/2023, 12:23 PM Patient ID: Yolanda [name], female   DOB: 06/20/1976, 48 y.o.   MRN: [id number]

## 2023-07-22 NOTE — Group Note (Signed)
 Recreation Therapy Group Note   Group Topic:Stress Management  Group Date: 07/22/2023 Start Time: 0935 End Time: 0955 Facilitators: Orlo Brickle-McCall, LRT,CTRS Location: [address] Dayroom   Group Topic: Stress Management  Goal Area(s) Addresses:  Patient will identify positive stress management techniques. Patient will identify benefits of using stress management post d/c.  Intervention: Insight Timer App  Activity: Meditation. LRT played a meditation that focused on having a mindful day. Patients were encouraged to follow along with the meditation and focus on their breathing. LRT shared with patients they could find other meditations on different apps, scripts, internet, etc.    Education:  Stress Management, Discharge Planning.   Education Outcome: Acknowledges Education   Affect/Mood: N/A   Participation Level: Did not attend    Clinical Observations/Individualized Feedback:     Plan: Continue to engage patient in RT group sessions 2-3x/week.   Osa Campoli-McCall, LRT,CTRS  07/22/2023 12:11 PM

## 2023-07-23 DIAGNOSIS — F251 Schizoaffective disorder, depressive type: Secondary | ICD-10-CM | POA: Diagnosis not present

## 2023-07-23 LAB — T3, FREE: T3, Free: 2.1 pg/mL (ref 2.0–4.4)

## 2023-07-23 MED ORDER — NAPHAZOLINE-PHENIRAMINE 0.025-0.3 % OP SOLN
1.0000 [drp] | Freq: Three times a day (TID) | OPHTHALMIC | Status: AC
  Administered 2023-07-23 – 2023-07-26 (×11): 1 [drp] via OPHTHALMIC
  Filled 2023-07-23: qty 5

## 2023-07-23 NOTE — Progress Notes (Signed)
 Pt completed interview with Becky Sax, [phone] Adult Home owner who reported pt is very friendly and seems to be a good person. Ms. Freida Busman requested pt's FL2 to be faxed, FL2 faxed. CSW will follow up and update Treatment Team.

## 2023-07-23 NOTE — BH IP Treatment Plan (Signed)
 Interdisciplinary Treatment and Diagnostic Plan Update  07/23/2023 Time of Session: 12:10 pm Yolanda [name] MRN: [id number]  Principal Diagnosis: Schizoaffective disorder, depressive type (HCC)  Secondary Diagnoses: Principal Problem:   Schizoaffective disorder, depressive type (HCC) Active Problems:   GERD (gastroesophageal reflux disease)   Intellectual disability   Tobacco use disorder   Current Medications:  Current Facility-Administered Medications  Medication Dose Route Frequency Provider Last Rate Last Admin   acetaminophen  (TYLENOL ) tablet 650 mg  650 mg Oral Q6H PRN Trudy Carwin, NP   650 mg at 07/22/23 2022   alum & mag hydroxide-simeth (MAALOX/MYLANTA) 200-200-20 MG/5ML suspension 30 mL  30 mL Oral Q4H PRN Onuoha, Chinwendu V, NP   30 mL at 05/31/23 0801   atorvastatin  (LIPITOR) tablet 10 mg  10 mg Oral Daily Nwoko, Agnes I, NP   10 mg at 07/22/23 1718   busPIRone  (BUSPAR ) tablet 15 mg  15 mg Oral BID Nwoko, Agnes I, NP   15 mg at 07/22/23 1718   cyanocobalamin  (VITAMIN B12) injection 1,000 mcg  1,000 mcg Intramuscular Q30 days Evelena, Nadir, MD   1,000 mcg at 07/03/23 1009   diphenhydrAMINE  (BENADRYL ) capsule 50 mg  50 mg Oral TID PRN Trudy Carwin, NP   50 mg at 06/03/23 1110   Or   diphenhydrAMINE  (BENADRYL ) injection 50 mg  50 mg Intramuscular TID PRN Trudy Carwin, NP       docusate sodium  (COLACE) capsule 100 mg  100 mg Oral Daily Nkwenti, Donia, NP   100 mg at 07/22/23 9063   haloperidol  (HALDOL ) tablet 5 mg  5 mg Oral TID PRN Collene Gouge I, NP   5 mg at 06/03/23 1110   Or   haloperidol  lactate (HALDOL ) injection 5 mg  5 mg Intramuscular TID PRN Collene Gouge I, NP       hydrocortisone  cream 1 %   Topical BID Ntuen, Tina C, FNP   Given at 07/22/23 9063   hydrOXYzine  (ATARAX ) tablet 25 mg  25 mg Oral TID PRN Nguyen, Julie, DO   25 mg at 07/22/23 2023   levothyroxine  (SYNTHROID ) tablet 25 mcg  25 mcg Oral Q0600 Parker, Alvin S, MD   25 mcg at 07/22/23 9346    LORazepam  (ATIVAN ) tablet 2 mg  2 mg Oral TID PRN Trudy Carwin, NP   2 mg at 06/03/23 1110   Or   LORazepam  (ATIVAN ) injection 2 mg  2 mg Intramuscular TID PRN Trudy Carwin, NP       magnesium  hydroxide (MILK OF MAGNESIA) suspension 30 mL  30 mL Oral Daily PRN Trudy Carwin, NP   30 mL at 07/18/23 1701   melatonin tablet 5 mg  5 mg Oral QHS Nkwenti, Bhavana Kady, NP   5 mg at 07/22/23 2112   nicotine  polacrilex (NICORETTE ) gum 2 mg  2 mg Oral PRN Goli, Veeraindar, MD   2 mg at 07/22/23 1413   paliperidone  (INVEGA ) 24 hr tablet 6 mg  6 mg Oral Daily Nkwenti, Lavren Lewan, NP   6 mg at 07/22/23 2112   pantoprazole  (PROTONIX ) EC tablet 40 mg  40 mg Oral Daily Nwoko, Agnes I, NP   40 mg at 07/22/23 0936   polyethylene glycol (MIRALAX  / GLYCOLAX ) packet 17 g  17 g Oral Daily PRN Goli, Veeraindar, MD   17 g at 06/07/23 1847   selenium  sulfide (SELSUN ) 1 % shampoo   Topical Daily PRN Izediuno, Vincent A, MD   Given at 07/20/23 2218   sertraline  (ZOLOFT ) tablet 150 mg  150 mg Oral Daily Massengill, Nathan, MD   150 mg at 07/22/23 9063   SUMAtriptan  (IMITREX ) tablet 25 mg  25 mg Oral BID PRN Tex Drilling, NP   25 mg at 07/10/23 1642   traZODone  (DESYREL ) tablet 50 mg  50 mg Oral QHS Tex Drilling, NP   50 mg at 07/22/23 2112   Vitamin D  (Ergocalciferol ) (DRISDOL ) 1.25 MG (50000 UNIT) capsule 50,000 Units  50,000 Units Oral Q7 days Tex Drilling, NP   50,000 Units at 07/20/23 1712   PTA Medications: Medications Prior to Admission  Medication Sig Dispense Refill Last Dose/Taking   busPIRone  (BUSPAR ) 15 MG tablet Take 15 mg by mouth 2 (two) times daily. (Patient not taking: Reported on 12/19/2022)      paliperidone  (INVEGA  SUSTENNA) 156 MG/ML SUSY injection Inject 156 mg into the muscle once. (Patient not taking: Reported on 12/19/2022)      sertraline  (ZOLOFT ) 50 MG tablet Take 150 mg by mouth daily. (Patient not taking: Reported on 12/19/2022)      traZODone  (DESYREL ) 100 MG tablet Take 100 mg by mouth at bedtime as  needed for sleep. (Patient not taking: Reported on 11/12/2022)       Patient Stressors: Medication change or noncompliance    Patient Strengths: Forensic Psychologist fund of knowledge   Treatment Modalities: Medication Management, Group therapy, Case management,  1 to 1 session with clinician, Psychoeducation, Recreational therapy.   Physician Treatment Plan for Primary Diagnosis: Schizoaffective disorder, depressive type (HCC) Long Term Goal(s): Improvement in symptoms so as ready for discharge   Short Term Goals: Ability to identify changes in lifestyle to reduce recurrence of condition will improve Ability to verbalize feelings will improve  Medication Management: Evaluate patient's response, side effects, and tolerance of medication regimen.  Therapeutic Interventions: 1 to 1 sessions, Unit Group sessions and Medication administration.  Evaluation of Outcomes: Adequate for Discharge  Physician Treatment Plan for Secondary Diagnosis: Principal Problem:   Schizoaffective disorder, depressive type (HCC) Active Problems:   GERD (gastroesophageal reflux disease)   Intellectual disability   Tobacco use disorder  Long Term Goal(s): Improvement in symptoms so as ready for discharge   Short Term Goals: Ability to identify changes in lifestyle to reduce recurrence of condition will improve Ability to verbalize feelings will improve     Medication Management: Evaluate patient's response, side effects, and tolerance of medication regimen.  Therapeutic Interventions: 1 to 1 sessions, Unit Group sessions and Medication administration.  Evaluation of Outcomes: Adequate for Discharge   RN Treatment Plan for Primary Diagnosis: Schizoaffective disorder, depressive type (HCC) Long Term Goal(s): Knowledge of disease and therapeutic regimen to maintain health will improve  Short Term Goals: Ability to verbalize frustration and anger appropriately will improve  Medication  Management: RN will administer medications as ordered by provider, will assess and evaluate patient's response and provide education to patient for prescribed medication. RN will report any adverse and/or side effects to prescribing provider.  Therapeutic Interventions: 1 on 1 counseling sessions, Psychoeducation, Medication administration, Evaluate responses to treatment, Monitor vital signs and CBGs as ordered, Perform/monitor CIWA, COWS, AIMS and Fall Risk screenings as ordered, Perform wound care treatments as ordered.  Evaluation of Outcomes: Adequate for Discharge   LCSW Treatment Plan for Primary Diagnosis: Schizoaffective disorder, depressive type (HCC) Long Term Goal(s): Safe transition to appropriate next level of care at discharge, Engage patient in therapeutic group addressing interpersonal concerns.  Short Term Goals: Engage patient in aftercare planning with referrals and resources  Therapeutic Interventions: Assess for all discharge needs, 1 to 1 time with Child psychotherapist, Explore available resources and support systems, Assess for adequacy in community support network, Educate family and significant other(s) on suicide prevention, Complete Psychosocial Assessment, Interpersonal group therapy.  Evaluation of Outcomes: Adequate for Discharge   Progress in Treatment:  Attending groups: No Participating in groups: No Taking medication as prescribed: Yes. Toleration medication: Yes. Family/Significant other contact made: Yes, individual(s) contacted:  Trease B[place]ner [phone] Patient understands diagnosis: Yes. Discussing patient identified problems/goals with staff: Yes. Medical problems stabilized or resolved: Yes. Denies suicidal/homicidal ideation: Yes. Issues/concerns per patient self-inventory: No.   New problem(s) identified: none reported   New Short Term/Long Term Goal(s): medication stabilization, elimination of SI thoughts, development of comprehensive mental  wellness plan.      Patient Goals:   placement   Discharge Plan or Barriers: Patient recently admitted. CSW will continue to follow and assess for appropriate referrals and possible discharge planning.      Reason for Continuation of Hospitalization: Depression Suicidal ideation   Estimated Length of Stay: Pt still expected to discharge  to group home awaiting Single Case agreement and enhanced rate from [facility].  Last 3 [place] Suicide Severity Risk Score: Flowsheet Row Ad[facility] (Current) [facility] from 12/19/2022 in [facility] at [facility] ED from 11/12/2022 in [facility] Department [facility]  C-SSRS RISK CATEGORY High Risk High Risk Moderate Risk       Last [facility] 2/9 Scores:     No data to display          Scribe for Treatment Team: Benjaman Donia JONELLE ISRAEL 07/23/2023 4:51 AM

## 2023-07-23 NOTE — Plan of Care (Signed)
  Problem: Coping: Goal: Ability to verbalize frustrations and anger appropriately will improve Outcome: Progressing   Problem: Safety: Goal: Periods of time without injury will increase Outcome: Progressing   Problem: Medication: Goal: Compliance with prescribed medication regimen will improve Outcome: Progressing   Problem: Self-Concept: Goal: Ability to disclose and discuss suicidal ideas will improve Outcome: Progressing

## 2023-07-23 NOTE — Progress Notes (Signed)
   07/23/23 0800  Psych Admission Type (Psych Patients Only)  Admission Status Voluntary  Psychosocial Assessment  Patient Complaints Anxiety;Sadness;Worrying  Eye Contact Fair  Facial Expression Animated;Anxious;Sad  Affect Anxious;Sad  Speech Logical/coherent  Interaction Childlike  Motor Activity Tremors  Appearance/Hygiene Disheveled  Behavior Characteristics Cooperative;Appropriate to situation  Mood Depressed  Thought Process  Coherency WDL  Content WDL  Delusions None reported or observed  Perception WDL  Hallucination None reported or observed  Judgment WDL  Confusion WDL  Danger to Self  Current suicidal ideation? Denies  Agreement Not to Harm Self Yes  Description of Agreement Verbal  Danger to Others  Danger to Others None reported or observed

## 2023-07-23 NOTE — Group Note (Signed)
 Recreation Therapy Group Note   Group Topic:Animal Assisted Therapy   Group Date: 07/23/2023 Start Time: 0945 End Time: 1030 Facilitators: Raesha Coonrod-McCall, LRT,CTRS Location: 300 [address] Dayroom  Animal-Assisted Activity (AAA) Program Checklist/Progress Notes Patient Eligibility Criteria Checklist & Daily Group note for Rec Tx Intervention  AAA/T Program Assumption of Risk Form signed by Patient/ or Parent Legal Guardian Yes  Patient understands his/her participation is voluntary Yes  Education: Charity Fundraiser, Appropriate Animal Interaction   Education Outcome: Acknowledges education.    Affect/Mood: N/A   Participation Level: Did not attend    Clinical Observations/Individualized Feedback:     Plan: Continue to engage patient in RT group sessions 2-3x/week.   Rajat Staver-McCall, LRT,CTRS  07/23/2023 12:33 PM

## 2023-07-23 NOTE — Group Note (Signed)
 LCSW Group Therapy Note   Group Date: 07/23/2023 Start Time: 1100 End Time: 1200   Type of Therapy and Topic:  Group Therapy: What is Anxiety?  Participation Level:  Did not attend.  Description of Group:  This group involves discussing what anxiety is, the symptoms that may present with anxiety, different types of anxiety, how anxiety can manifest in different areas of our lives, and how anxiety is commonly treated. After the discussion around anxiety with these different components, individuals will fill out a worksheet Introduction to Anxiety and explore how anxiety shows up for them. After completion of the worksheet, the group will discuss responses and provide feedback to one another.   Therapeutic Goals: Developing effective coping mechanisms, gaining awareness of anxiety triggers, improving stress management skills, enhancing daily functioning, and building a supportive community by allowing individuals to share experiences and learn from others facing similar challenges   Summary of Patient Progress:  Did not attend.  Therapeutic Modalities:  CBT  Jenkins LULLA Primer, LCSWA 07/23/2023  1:09 PM

## 2023-07-23 NOTE — [facility] Group Notes (Signed)
 Psychoeducational Group Note  Date:  07/23/2023 Time:  2000  Group Topic/Focus:  Wrap up group  Participation Level: Did Not Attend  Participation Quality:  Not Applicable  Affect:  Not Applicable  Cognitive:  Not Applicable  Insight:  Not Applicable  Engagement in Group: Not Applicable  Additional Comments:  Did not attend.   Lenora Manuelita RAMAN 07/23/2023, 9:42 PM

## 2023-07-23 NOTE — Progress Notes (Signed)
 [facility] MD Progress Note  07/23/2023 10:38 AM Oliviarose MALIN SAMBRANO  MRN:  [id number] HPI:   48 year old African-American female, single, on SSI disability, homeless.  Background history of intellectual disability, substance use disorder and schizoaffective disorder depressive type.  Presented via emergency services on account of worsening depression associated with suicidal thoughts.  Expressed thoughts of suicide by stabbing herself.  24 Hour chart review:  Pt remains compliant with scheduled meds, slept through the night as per nursing reports, no PRN meds overnight. V/S earlier today morning with BP low at 89/74. HR WNL at 84. Nursing asked to recheck BP.   Patient assessment: On assessment today, the pt reports that their mood is still depressed, but only due to still being hospitalized here at the hospital.  Reports that anxiety is moderate today. Sleep is good Appetite is good Concentration is fair  Energy level is average Denies suicidal thoughts. Denies suicidal intent and plan.  Denies having any HI.  Denies having psychotic symptoms.   Denies having side effects to current psychiatric medications.   We keeping medications same with no changes today. Discussed the following psychosocial stressors: Continuous hospitalization. Empathy and active listening provided, pt encouraged to stay positive regarding the new referral that is being sent  to a new group home for her.  We are keeping all meds as currently ordered with no changes being completed today, and we will continue to monitor.  Labs Reviewed: FT3 & FT4 were slightly subtherapeutic 2 weeks ago at 1.9 & 0.56 respectively. Repeated and FT3 is WNL & FT4 is still slightly low at 0.56. Pt has a history of hypothyroidism & is being treated with Levothyroxine .  Principal Problem: Schizoaffective disorder, depressive type (HCC) Diagnosis: Principal Problem:   Schizoaffective disorder, depressive type (HCC) Active Problems:   GERD  (gastroesophageal reflux disease)   Intellectual disability   Tobacco use disorder  Total Time spent with patient: 20 minutes  Past Psychiatric History: Extensive history of mental illness.  She has been tried on multiple medications over the years.  See H&P for full details.  Past Medical History:  Past Medical History:  Diagnosis Date   Bipolar affect, depressed (HCC)    Constipation 08/17/2022   Depression    Falls 07/21/2022   Fracture of femoral neck, right, closed (HCC) 01/17/2022   Herpes simplex 08/22/2017   Open fracture dislocation of right elbow joint 01/17/2022    Past Surgical History:  Procedure Laterality Date   NO PAST SURGERIES     SALPINGECTOMY     Family History: History reviewed. No pertinent family history. Family Psychiatric  History: Extensive family history of mood disorder. Social History:  Social History   Substance and Sexual Activity  Alcohol Use Yes     Social History   Substance and Sexual Activity  Drug Use Yes   Types: Cocaine, Marijuana    Social History   Socioeconomic History   Marital status: Single    Spouse name: Not on file   Number of children: Not on file   Years of education: Not on file   Highest education level: Not on file  Occupational History   Not on file  Tobacco Use   Smoking status: Every Day   Smokeless tobacco: Not on file  Substance and Sexual Activity   Alcohol use: Yes   Drug use: Yes    Types: Cocaine, Marijuana   Sexual activity: Yes  Other Topics Concern   Not on file  Social History Narrative  Not on file   Social Drivers of Health   Financial Resource Strain: Low Risk  (09/12/2022)   Received from [facility], [facility]   Overall Financial Resource Strain (CARDIA)    Difficulty of Paying Living Expenses: Not hard at all  Food Insecurity: Patient Declined (12/20/2022)   Hunger Vital Sign    Worried About Running Out of Food in the Last Year: Patient declined    Ran Out of Food in the  Last Year: Patient declined  Transportation Needs: No Transportation Needs (12/20/2022)   PRAPARE - Administrator, Civil Service (Medical): No    Lack of Transportation (Non-Medical): No  Physical Activity: Not on file  Stress: No Stress Concern Present (07/17/2022)   Received from [facility], [facility] - Occupational Stress Questionnaire    Feeling of Stress : Not at all  Social Connections: Unknown (07/16/2022)   Received from [facility], [facility]   Social Network    Social Network: Not on file    Current Medications: Current Facility-Administered Medications  Medication Dose Route Frequency Provider Last Rate Last Admin   acetaminophen  (TYLENOL ) tablet 650 mg  650 mg Oral Q6H PRN Trudy Carwin, NP   650 mg at 07/22/23 2022   alum & mag hydroxide-simeth (MAALOX/MYLANTA) 200-200-20 MG/5ML suspension 30 mL  30 mL Oral Q4H PRN Onuoha, Chinwendu V, NP   30 mL at 05/31/23 0801   atorvastatin  (LIPITOR) tablet 10 mg  10 mg Oral Daily Nwoko, Agnes I, NP   10 mg at 07/22/23 1718   busPIRone  (BUSPAR ) tablet 15 mg  15 mg Oral BID Nwoko, Agnes I, NP   15 mg at 07/23/23 0818   cyanocobalamin  (VITAMIN B12) injection 1,000 mcg  1,000 mcg Intramuscular Q30 days Evelena Figures, MD   1,000 mcg at 07/03/23 1009   diphenhydrAMINE  (BENADRYL ) capsule 50 mg  50 mg Oral TID PRN Trudy Carwin, NP   50 mg at 06/03/23 1110   Or   diphenhydrAMINE  (BENADRYL ) injection 50 mg  50 mg Intramuscular TID PRN Trudy Carwin, NP       docusate sodium  (COLACE) capsule 100 mg  100 mg Oral Daily Damaris Abeln, Donia, NP   100 mg at 07/23/23 0818   haloperidol  (HALDOL ) tablet 5 mg  5 mg Oral TID PRN Collene Gouge I, NP   5 mg at 06/03/23 1110   Or   haloperidol  lactate (HALDOL ) injection 5 mg  5 mg Intramuscular TID PRN Collene Gouge I, NP       hydrocortisone  cream 1 %   Topical BID Ntuen, Tina C, FNP   Given at 07/22/23 9063   hydrOXYzine  (ATARAX ) tablet 25 mg  25 mg  Oral TID PRN Nguyen, Julie, DO   25 mg at 07/22/23 2023   levothyroxine  (SYNTHROID ) tablet 25 mcg  25 mcg Oral Q0600 Parker, Alvin S, MD   25 mcg at 07/23/23 9375   LORazepam  (ATIVAN ) tablet 2 mg  2 mg Oral TID PRN Trudy Carwin, NP   2 mg at 06/03/23 1110   Or   LORazepam  (ATIVAN ) injection 2 mg  2 mg Intramuscular TID PRN Trudy Carwin, NP       magnesium  hydroxide (MILK OF MAGNESIA) suspension 30 mL  30 mL Oral Daily PRN Trudy Carwin, NP   30 mL at 07/18/23 1701   melatonin tablet 5 mg  5 mg Oral QHS Clydell Sposito, NP   5 mg at 07/22/23 2112  naphazoline-pheniramine (NAPHCON-A) 0.025-0.3 % ophthalmic solution 1 drop  1 drop Both Eyes TID Diara Chaudhari, NP       nicotine  polacrilex (NICORETTE ) gum 2 mg  2 mg Oral PRN Goli, Veeraindar, MD   2 mg at 07/22/23 1413   paliperidone  (INVEGA ) 24 hr tablet 6 mg  6 mg Oral Daily Day Greb, Donia, NP   6 mg at 07/22/23 2112   pantoprazole  (PROTONIX ) EC tablet 40 mg  40 mg Oral Daily Nwoko, Mac I, NP   40 mg at 07/23/23 0818   polyethylene glycol (MIRALAX  / GLYCOLAX ) packet 17 g  17 g Oral Daily PRN Goli, Veeraindar, MD   17 g at 06/07/23 1847   selenium  sulfide (SELSUN ) 1 % shampoo   Topical Daily PRN Izediuno, Vincent A, MD   Given at 07/20/23 2218   sertraline  (ZOLOFT ) tablet 150 mg  150 mg Oral Daily Massengill, Rankin, MD   150 mg at 07/23/23 0818   SUMAtriptan  (IMITREX ) tablet 25 mg  25 mg Oral BID PRN Tex Donia, NP   25 mg at 07/10/23 1642   traZODone  (DESYREL ) tablet 50 mg  50 mg Oral QHS Jarriel Papillion, Donia, NP   50 mg at 07/22/23 2112   Vitamin D  (Ergocalciferol ) (DRISDOL ) 1.25 MG (50000 UNIT) capsule 50,000 Units  50,000 Units Oral Q7 days Tex Donia, NP   50,000 Units at 07/20/23 1712   Lab Results:  Results for orders placed or performed during the hospital encounter of 12/20/22 (from the past 48 hours)  T3, free     Status: None   Collection Time: 07/22/23  6:56 PM  Result Value Ref Range   T3, Free 2.1 2.0 - 4.4 pg/mL    Comment:  (NOTE) Performed At: [facility] [address], [place] [id number] Jennette Shorter MD Ey:[id number]   T4, free     Status: Abnormal   Collection Time: 07/22/23  6:56 PM  Result Value Ref Range   Free T4 0.56 (L) 0.61 - 1.12 ng/dL    Comment: (NOTE) Biotin ingestion may interfere with free T4 tests. If the results are inconsistent with the TSH level, previous test results, or the clinical presentation, then consider biotin interference. If needed, order repeat testing after stopping biotin. Performed at [facility], [address] [place], [place] [address]   TSH     Status: Abnormal   Collection Time: 07/22/23  6:56 PM  Result Value Ref Range   TSH 5.782 (H) 0.350 - 4.500 uIU/mL    Comment: Performed by a 3rd Generation assay with a functional sensitivity of <=0.01 uIU/mL. Performed at [facility], [address] [place], [place] [address]     Blood Alcohol level:  Lab Results  Component Value Date   ETH <10 12/19/2022   ETH <10 11/21/2019   Metabolic Disorder Labs: Lab Results  Component Value Date   HGBA1C 5.6 06/16/2023   MPG 114.02 06/16/2023   MPG 79.58 12/21/2022   No results found for: PROLACTIN Lab Results  Component Value Date   CHOL 256 (H) 06/16/2023   TRIG 224 (H) 06/16/2023   HDL 83 06/16/2023   CHOLHDL 3.1 06/16/2023   VLDL 45 (H) 06/16/2023   LDLCALC 128 (H) 06/16/2023   LDLCALC 149 (H) 12/21/2022   Physical Findings: AIMS: Facial and Oral Movements Muscles of Facial Expression: None Lips and Perioral Area: None Jaw: None Tongue: None,Extremity Movements Upper (arms, wrists, hands, fingers): None Lower (legs, knees, ankles, toes): None, Trunk Movements  Neck, shoulders, hips: None, Global Judgements Severity of abnormal movements overall : None Incapacitation due to abnormal movements: None Patient's awareness of abnormal movements: No Awareness, Dental Status Current problems with teeth and/or  dentures?: No Does patient usually wear dentures?: No  CIWA:    COWS:     Musculoskeletal: Strength & Muscle Tone: within normal limits Gait & Station: normal Patient leans: N/A  Psychiatric Specialty Exam:  Presentation  General Appearance:  Casually dressed, overweight, not in any distress.  No EPS. Eye Contact: Fair  Speech: Slurred and slow.  Speech Volume: At baseline.  Mood and Affect  Mood: Depressed but not pervasively so. Affect: Blunted and mood congruent.  Thought Process  Thought Processes: Decreased speed of thoughts but goal-directed.  Descriptions of Associations:  Linear.  Orientation:Full (Time, Place and Person)  Thought Content: WNL  Hallucinations: No hallucination in any modality.   Sensorium  Memory: Recent Fair  Judgment: Fair  Insight: Fair   Chartered Certified Accountant: Fair  Attention Span: Fair  Recall: [employer] of Knowledge: Fair  Language: Fair   Psychomotor Activity  Psychomotor Activity: Psychomotor Activity: Normal    Physical Exam: Physical Exam HENT:     Nose: Nose normal.     Mouth/Throat:     Pharynx: Oropharynx is clear.  Cardiovascular:     Rate and Rhythm: Normal rate.     Pulses: Normal pulses.  Pulmonary:     Effort: Pulmonary effort is normal.  Genitourinary:    Comments: Deferred Musculoskeletal:     Cervical back: Normal range of motion.  Skin:    General: Skin is warm and dry.  Neurological:     General: No focal deficit present.     Mental Status: She is alert and oriented to person, place, and time.  Psychiatric:        Mood and Affect: Mood normal.        Behavior: Behavior normal.        Thought Content: Thought content normal.        Judgment: Judgment normal.    Review of Systems  Constitutional:  Negative for chills, diaphoresis and fever.  HENT:  Negative for congestion and sore throat.   Respiratory:  Negative for cough, shortness of breath and  wheezing.   Cardiovascular:  Negative for chest pain and palpitations.  Gastrointestinal:  Negative for abdominal pain, diarrhea, heartburn, nausea and vomiting.  Musculoskeletal:  Negative for joint pain and myalgias.  Neurological:  Negative for dizziness, tingling, tremors, sensory change, speech change, focal weakness, seizures, loss of consciousness, weakness and headaches.  Psychiatric/Behavioral:  Positive for depression (stable). Negative for hallucinations, memory loss, substance abuse and suicidal ideas. The patient is nervous/anxious (Stable) and has insomnia (Stable).    Blood pressure (!) 89/74, pulse 84, temperature 97.6 F (36.4 C), temperature source Oral, resp. rate 16, height 4' 11 (1.499 m), weight 63 kg, SpO2 97%. Body mass index is 28.05 kg/m.  Treatment Plan Summary: Currently mentally stable for discharge.  Medications: 1.  Continue Invega  ER 6 mg at bedtime. 2.  Continue sertraline  150 mg daily. 3.  Continue trazodone  50 mg as needed for insomnia. 4.  Continue Buspar  15 mg po bid for anxiety. 5.  Continue Hydroxyzine  25 mg po tid prn for anxiety. 6.  Continue Melatonin 5 mg po q bedtime for sleep. 7.  Continue Miralax  17 gm po daily prn for constipation. 8.  Continue Vitamin D1.25 (50,000 units daily for bone health.  CSW is  continuing to coordinate discharge planning with DSS. We are continuing Q15 minute checks & encouraging participating in group activities.   Donia Snell, NP, pmhnp. 07/23/2023, 10:38 AM Patient ID: Mitch JONETTA Lance, female   DOB: [birth year], 48 y.o.

## 2023-07-23 NOTE — NC FL2 (Addendum)
 [place]  MEDICAID [id number] LEVEL OF CARE FORM     IDENTIFICATION  Patient Name: Yolanda [name] Birthdate: 1976/03/21 Sex: female Admission Date (Current Location): 12/20/2022  [facility] and [place] Number:  Producer, Television/film/video and Address:  [facility], [address], [place], [place] [address]      Provider Number:    Attending Physician Name and Address:  Kennyth Starleen RAMAN, MD  Relative Name and Phone Number:  Cimone Fahey [phone]    Current Level of Care: Hospital Recommended Level of Care: Other (Comment) (Group Home) Prior Approval Number:    Date Approved/Denied:   PASRR Number:    Discharge Plan: Other (Comment) (Group Home)    Current Diagnoses: Patient Active Problem List   Diagnosis Date Noted   Tobacco use disorder 01/01/2023   Bipolar 1 disorder, depressed (HCC) 11/13/2022   Suicide ideation 11/13/2022   Vitamin D  deficiency 07/21/2022   Schizoaffective disorder (HCC) 07/18/2022   Trauma 01/17/2022   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   Pancreatic mass 07/31/2021   Self-cutting of wrist (HCC) 07/24/2020   Intellectual disability 07/23/2020   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   Non compliance w medication regimen 11/16/2019   Schizoaffective disorder, depressive type (HCC) 11/13/2019   GERD (gastroesophageal reflux disease) 06/03/2018    Orientation RESPIRATION BLADDER Height & Weight     Self, Time  Normal Incontinent Weight: 63 kg Height:  4' 11 (149.9 cm)  BEHAVIORAL SYMPTOMS/MOOD NEUROLOGICAL BOWEL NUTRITION STATUS      Continent    AMBULATORY STATUS COMMUNICATION OF NEEDS Skin   Independent Verbally Normal                       Personal Care Assistance Level of Assistance   Independent Independent   Functional Limitations Info  Speech     Speech Info: Impaired    SPECIAL CARE FACTORS FREQUENCY                        Contractures  None    Additional Factors Info   None               Current Medications (07/23/2023):  This is the current hospital active medication list Current Facility-Administered Medications  Medication Dose Route Frequency Provider Last Rate Last Admin   acetaminophen  (TYLENOL ) tablet 650 mg  650 mg Oral Q6H PRN Trudy Carwin, NP   650 mg at 07/22/23 2022   alum & mag hydroxide-simeth (MAALOX/MYLANTA) 200-200-20 MG/5ML suspension 30 mL  30 mL Oral Q4H PRN Onuoha, Chinwendu V, NP   30 mL at 05/31/23 0801   atorvastatin  (LIPITOR) tablet 10 mg  10 mg Oral Daily Nwoko, Agnes I, NP   10 mg at 07/22/23 1718   busPIRone  (BUSPAR ) tablet 15 mg  15 mg Oral BID Nwoko, Agnes I, NP   15 mg at 07/23/23 0818   cyanocobalamin  (VITAMIN B12) injection 1,000 mcg  1,000 mcg Intramuscular Q30 days Evelena, Nadir, MD   1,000 mcg at 07/03/23 1009   diphenhydrAMINE  (BENADRYL ) capsule 50 mg  50 mg Oral TID PRN Trudy Carwin, NP   50 mg at 06/03/23 1110   Or   diphenhydrAMINE  (BENADRYL ) injection 50 mg  50 mg Intramuscular TID PRN Trudy Carwin, NP       docusate sodium  (COLACE) capsule 100 mg  100 mg Oral Daily Nkwenti, Johari Bennetts, NP   100 mg  at 07/23/23 0818   haloperidol  (HALDOL ) tablet 5 mg  5 mg Oral TID PRN Collene Gouge I, NP   5 mg at 06/03/23 1110   Or   haloperidol  lactate (HALDOL ) injection 5 mg  5 mg Intramuscular TID PRN Collene Gouge I, NP       hydrocortisone  cream 1 %   Topical BID Ntuen, Tina C, FNP   Given at 07/22/23 9063   hydrOXYzine  (ATARAX ) tablet 25 mg  25 mg Oral TID PRN Nguyen, Julie, DO   25 mg at 07/22/23 2023   levothyroxine  (SYNTHROID ) tablet 25 mcg  25 mcg Oral Q0600 Parker, Alvin S, MD   25 mcg at 07/23/23 9375   LORazepam  (ATIVAN ) tablet 2 mg  2 mg Oral TID PRN Trudy Carwin, NP   2 mg at 06/03/23 1110   Or   LORazepam  (ATIVAN ) injection 2 mg  2 mg Intramuscular TID PRN Trudy Carwin, NP       magnesium  hydroxide (MILK OF MAGNESIA) suspension 30 mL  30 mL Oral Daily PRN  Trudy Carwin, NP   30 mL at 07/18/23 1701   melatonin tablet 5 mg  5 mg Oral QHS Nkwenti, Tyshauna Finkbiner, NP   5 mg at 07/22/23 2112   naphazoline-pheniramine (NAPHCON-A) 0.025-0.3 % ophthalmic solution 1 drop  1 drop Both Eyes TID Tex Drilling, NP   1 drop at 07/23/23 1504   nicotine  polacrilex (NICORETTE ) gum 2 mg  2 mg Oral PRN Goli, Veeraindar, MD   2 mg at 07/22/23 1413   paliperidone  (INVEGA ) 24 hr tablet 6 mg  6 mg Oral Daily Nkwenti, Abhishek Levesque, NP   6 mg at 07/22/23 2112   pantoprazole  (PROTONIX ) EC tablet 40 mg  40 mg Oral Daily Nwoko, Agnes I, NP   40 mg at 07/23/23 0818   polyethylene glycol (MIRALAX  / GLYCOLAX ) packet 17 g  17 g Oral Daily PRN Goli, Veeraindar, MD   17 g at 06/07/23 1847   selenium  sulfide (SELSUN ) 1 % shampoo   Topical Daily PRN Izediuno, Vincent A, MD   Given at 07/20/23 2218   sertraline  (ZOLOFT ) tablet 150 mg  150 mg Oral Daily Massengill, Rankin, MD   150 mg at 07/23/23 0818   SUMAtriptan  (IMITREX ) tablet 25 mg  25 mg Oral BID PRN Tex Drilling, NP   25 mg at 07/10/23 1642   traZODone  (DESYREL ) tablet 50 mg  50 mg Oral QHS Tex Drilling, NP   50 mg at 07/22/23 2112   Vitamin D  (Ergocalciferol ) (DRISDOL ) 1.25 MG (50000 UNIT) capsule 50,000 Units  50,000 Units Oral Q7 days Tex Drilling, NP   50,000 Units at 07/20/23 1712     Discharge Medications: Please see discharge summary for a list of discharge medications.  Relevant Imaging Results:  Relevant Lab Results:   Additional Information    Fabiola Mudgett, Drilling SAUNDERS, LCSWA

## 2023-07-24 DIAGNOSIS — F251 Schizoaffective disorder, depressive type: Secondary | ICD-10-CM | POA: Diagnosis not present

## 2023-07-24 NOTE — BHH Group Notes (Signed)

## 2023-07-24 NOTE — Group Note (Signed)
 Recreation Therapy Group Note   Group Topic:Team Building  Group Date: 07/24/2023 Start Time: 0935 End Time: 1005 Facilitators: Markese Bloxham-McCall, LRT,CTRS Location: [address] Dayroom   Group Topic: Communication, Team Building, Problem Solving  Goal Area(s) Addresses:  Patient will effectively work with peer towards shared goal.  Patient will identify skills used to make activity successful.  Patient will identify how skills used during activity can be used to reach post d/c goals.   Intervention: STEM Activity  Activity: Straw Bridge. In teams of 3-5, patients were given 15 plastic drinking straws and an equal length of masking tape. Using the materials provided, patients were instructed to build a free standing bridge-like structure to suspend an everyday item (ex: puzzle box) off of the floor or table surface. All materials were required to be used by the team in their design. LRT facilitated post-activity discussion reviewing team process. Patients were encouraged to reflect how the skills used in this activity can be generalized to daily life post discharge.   Education: Pharmacist, community, Scientist, physiological, Discharge Planning   Education Outcome: Acknowledges education/In group clarification offered/Needs additional education.    Affect/Mood: N/A   Participation Level: Did not attend    Clinical Observations/Individualized Feedback:     Plan: Continue to engage patient in RT group sessions 2-3x/week.   Kyrin Gratz-McCall, LRT,CTRS 07/24/2023 12:16 PM

## 2023-07-24 NOTE — Progress Notes (Signed)
   07/24/23 0800  Psych Admission Type (Psych Patients Only)  Admission Status Voluntary  Psychosocial Assessment  Patient Complaints Anxiety;Depression  Eye Contact Fair  Facial Expression Anxious;Sad  Affect Anxious;Sad;Depressed  Speech Logical/coherent  Interaction Childlike  Motor Activity Tremors  Appearance/Hygiene Disheveled  Behavior Characteristics Appropriate to situation  Mood Depressed  Aggressive Behavior  Effect No apparent injury  Thought Process  Coherency WDL  Content WDL  Delusions None reported or observed  Perception WDL  Hallucination None reported or observed  Judgment WDL  Confusion WDL  Danger to Self  Current suicidal ideation? Denies  Self-Injurious Behavior No self-injurious ideation or behavior indicators observed or expressed   Agreement Not to Harm Self Yes  Description of Agreement Verbal  Danger to Others  Danger to Others None reported or observed  Danger to Others Abnormal  Harmful Behavior to others No threats or harm toward other people  Destructive Behavior No threats or harm toward property

## 2023-07-24 NOTE — Group Note (Signed)
 Date:  07/24/2023 Time:  12:54 PM  Group Topic/Focus:  Goals Group:   The focus of this group is to help patients establish daily goals to achieve during treatment and discuss how the patient can incorporate goal setting into their daily lives to aide in recovery. Orientation:   The focus of this group is to educate the patient on the purpose and policies of crisis stabilization and provide a format to answer questions about their admission.  The group details unit policies and expectations of patients while admitted.     Participation Level:  Did Not Attend  Participation Quality:   n/a  Affect:   n/a  Cognitive:   n/a  Insight: None  Engagement in Group:   n/a  Modes of Intervention:   n/a  Additional Comments:   Pt did not attend.  Shade Darby Kijuan Gallicchio 07/24/2023, 12:54 PM

## 2023-07-24 NOTE — [facility] Group Notes (Signed)
 Psychoeducational Group Note  Date:  07/24/2023 Time:  2000  Group Topic/Focus: Narcotics Anonymous Meeting  Participation Level: Did Not Attend  Participation Quality:  Not Applicable  Affect:  Not Applicable  Cognitive:  Not Applicable  Insight:  Not Applicable  Engagement in Group: Not Applicable  Additional Comments:  Did Not Attend  Catharine Clock 07/24/2023, 8:42 PM

## 2023-07-24 NOTE — Progress Notes (Addendum)
 [facility] MD Progress Note  07/24/2023 9:37 AM Yolanda [name]  MRN:  [id number] HPI:   48 year old African-American female, single, on SSI disability, homeless.  Background history of intellectual disability, substance use disorder and schizoaffective disorder depressive type. Presented via emergency services on account of worsening depression associated with suicidal thoughts. Expressed thoughts of suicide by stabbing herself.  Chart Review from last 24 hours:  The patient's chart and nursing notes were reviewed. The patient's case was discussed in multidisciplinary team meeting. Vital signs - Blood pressure low this morning at 82/52, heart rate 59. Nursing staff asked to recheck vital signs - blood pressure 98/71, heart rate 111. Patient encouraged to increase oral fluid intake. Patient is compliant with routine medication regimen without difficulty. Sleep hours last night: 8.25 hours, as documented in the nursing flow sheets. No behavioral episodes or nursing concerns were reported at this time. She required Tylenol  and Imitrex  PRN medication, according to the nursing record  Patient assessment: On assessment today, the patient described her mood as "it's okay." She continues to endorse moderate depression and anxiety, due to prolonged hospitalization while awaiting appropriate placement. Sleep and appetitie reported as satisfactory. Energy level is average. She denies suicidal or homicidal ideations. Denies having psychotic symptoms. Denies side effects attributed to current psychiatric medication regimen.    Medication regimen will remain the same with no adjustments warranted today. Discussed the following psychosocial stressors: Continuous hospitalization. Spiritual encouragement and active listening provided, patient encouraged to remain positive regarding the recent referral that was sent to a new group home for potential housing.   Labs Reviewed: FT3 & FT4 were slightly subtherapeutic 2 weeks  ago at 1.9 & 0.56 respectively. Repeated and FT3 is WNL & FT4 is still slightly low at 0.56. Patient has a history of hypothyroidism & is being treated with Levothyroxine .  Principal Problem: Schizoaffective disorder, depressive type (HCC) Diagnosis: Principal Problem:   Schizoaffective disorder, depressive type (HCC) Active Problems:   GERD (gastroesophageal reflux disease)   Intellectual disability   Tobacco use disorder  Total Time spent with patient: 20 minutes  Past Psychiatric History: Extensive history of mental illness.  She has been tried on multiple medications over the years.  See H&P for full details.  Past Medical History:  Past Medical History:  Diagnosis Date   Bipolar affect, depressed (HCC)    Constipation 08/17/2022   Depression    Falls 07/21/2022   Fracture of femoral neck, right, closed (HCC) 01/17/2022   Herpes simplex 08/22/2017   Open fracture dislocation of right elbow joint 01/17/2022    Past Surgical History:  Procedure Laterality Date   NO PAST SURGERIES     SALPINGECTOMY     Family History: History reviewed. No pertinent family history. Family Psychiatric  History: Extensive family history of mood disorder. Social History:  Social History   Substance and Sexual Activity  Alcohol Use Yes     Social History   Substance and Sexual Activity  Drug Use Yes   Types: Cocaine, Marijuana    Social History   Socioeconomic History   Marital status: Single    Spouse name: Not on file   Number of children: Not on file   Years of education: Not on file   Highest education level: Not on file  Occupational History   Not on file  Tobacco Use   Smoking status: Every Day   Smokeless tobacco: Not on file  Substance and Sexual Activity   Alcohol use: Yes   Drug use:  Yes    Types: Cocaine, Marijuana   Sexual activity: Yes  Other Topics Concern   Not on file  Social History Narrative   Not on file   Social Drivers of Health   Financial  Resource Strain: Low Risk  (09/12/2022)   Received from [facility]   Overall Financial Resource Strain (CARDIA)    Difficulty of Paying Living Expenses: Not hard at all  Food Insecurity: Patient Declined (12/20/2022)   Hunger Vital Sign    Worried About Running Out of Food in the Last Year: Patient declined    Ran Out of Food in the Last Year: Patient declined  Transportation Needs: No Transportation Needs (12/20/2022)   PRAPARE - Administrator, Civil Service (Medical): No    Lack of Transportation (Non-Medical): No  Physical Activity: Not on file  Stress: No Stress Concern Present (07/17/2022)   Received from [facility] - Occupational Stress Questionnaire    Feeling of Stress : Not at all  Social Connections: Unknown (07/16/2022)   Received from [facility]   Social Network    Social Network: Not on file    Current Medications: Current Facility-Administered Medications  Medication Dose Route Frequency Provider Last Rate Last Admin   acetaminophen  (TYLENOL ) tablet 650 mg  650 mg Oral Q6H PRN Dorthea Gauze, NP   650 mg at 07/23/23 1821   alum & mag hydroxide-simeth (MAALOX/MYLANTA) 200-200-20 MG/5ML suspension 30 mL  30 mL Oral Q4H PRN Onuoha, Chinwendu V, NP   30 mL at 05/31/23 0801   atorvastatin  (LIPITOR) tablet 10 mg  10 mg Oral Daily Nwoko, Agnes I, NP   10 mg at 07/23/23 1817   busPIRone  (BUSPAR ) tablet 15 mg  15 mg Oral BID Nwoko, Agnes I, NP   15 mg at 07/23/23 1816   cyanocobalamin  (VITAMIN B12) injection 1,000 mcg  1,000 mcg Intramuscular Q30 days Alver Jobs, MD   1,000 mcg at 07/03/23 1009   diphenhydrAMINE  (BENADRYL ) capsule 50 mg  50 mg Oral TID PRN Dorthea Gauze, NP   50 mg at 06/03/23 1110   Or   diphenhydrAMINE  (BENADRYL ) injection 50 mg  50 mg Intramuscular TID PRN Dorthea Gauze, NP       docusate sodium  (COLACE) capsule 100 mg  100 mg Oral Daily Nkwenti, Arzella Laurence, NP    100 mg at 07/23/23 0818   haloperidol  (HALDOL ) tablet 5 mg  5 mg Oral TID PRN Asuncion Layer I, NP   5 mg at 06/03/23 1110   Or   haloperidol  lactate (HALDOL ) injection 5 mg  5 mg Intramuscular TID PRN Asuncion Layer I, NP       hydrocortisone  cream 1 %   Topical BID Ntuen, Tina C, FNP   Given at 07/22/23 1610   hydrOXYzine  (ATARAX ) tablet 25 mg  25 mg Oral TID PRN Nguyen, Julie, DO   25 mg at 07/22/23 2023   levothyroxine  (SYNTHROID ) tablet 25 mcg  25 mcg Oral Q0600 Parker, Alvin S, MD   25 mcg at 07/24/23 9604   LORazepam  (ATIVAN ) tablet 2 mg  2 mg Oral TID PRN Dorthea Gauze, NP   2 mg at 06/03/23 1110   Or   LORazepam  (ATIVAN ) injection 2 mg  2 mg Intramuscular TID PRN Dorthea Gauze, NP       magnesium  hydroxide (MILK OF MAGNESIA) suspension 30 mL  30 mL Oral Daily PRN Dorthea Gauze, NP   30  mL at 07/18/23 1701   melatonin tablet 5 mg  5 mg Oral QHS Nkwenti, Doris, NP   5 mg at 07/23/23 2108   naphazoline-pheniramine (NAPHCON-A) 0.025-0.3 % ophthalmic solution 1 drop  1 drop Both Eyes TID Robet Chiquito, NP   1 drop at 07/23/23 2110   nicotine  polacrilex (NICORETTE ) gum 2 mg  2 mg Oral PRN Goli, Veeraindar, MD   2 mg at 07/22/23 1413   paliperidone  (INVEGA ) 24 hr tablet 6 mg  6 mg Oral Daily Nkwenti, Doris, NP   6 mg at 07/23/23 2109   pantoprazole  (PROTONIX ) EC tablet 40 mg  40 mg Oral Daily Nwoko, Agnes I, NP   40 mg at 07/23/23 0818   polyethylene glycol (MIRALAX  / GLYCOLAX ) packet 17 g  17 g Oral Daily PRN Goli, Veeraindar, MD   17 g at 06/07/23 1847   selenium  sulfide (SELSUN ) 1 % shampoo   Topical Daily PRN Amelie Jury, MD   Given at 07/20/23 2218   sertraline  (ZOLOFT ) tablet 150 mg  150 mg Oral Daily Massengill, Elana Grayer, MD   150 mg at 07/23/23 0818   SUMAtriptan  (IMITREX ) tablet 25 mg  25 mg Oral BID PRN Robet Chiquito, NP   25 mg at 07/23/23 1828   traZODone  (DESYREL ) tablet 50 mg  50 mg Oral QHS Robet Chiquito, NP   50 mg at 07/23/23 2108   Vitamin D  (Ergocalciferol ) (DRISDOL )  1.25 MG (50000 UNIT) capsule 50,000 Units  50,000 Units Oral Q7 days Robet Chiquito, NP   50,000 Units at 07/20/23 1712   Lab Results:  Results for orders placed or performed during the hospital encounter of 12/20/22 (from the past 48 hours)  T3, free     Status: None   Collection Time: 07/22/23  6:56 PM  Result Value Ref Range   T3, Free 2.1 2.0 - 4.4 pg/mL    Comment: (NOTE) Performed At: [facility] [address] [place], [place] [id number] Pearlean Botts MD VF:[id number]   T4, free     Status: Abnormal   Collection Time: 07/22/23  6:56 PM  Result Value Ref Range   Free T4 0.56 (L) 0.61 - 1.12 ng/dL    Comment: (NOTE) Biotin ingestion may interfere with free T4 tests. If the results are inconsistent with the TSH level, previous test results, or the clinical presentation, then consider biotin interference. If needed, order repeat testing after stopping biotin. Performed at [facility], [address], [place], [place] [address]   TSH     Status: Abnormal   Collection Time: 07/22/23  6:56 PM  Result Value Ref Range   TSH 5.782 (H) 0.350 - 4.500 uIU/mL    Comment: Performed by a 3rd Generation assay with a functional sensitivity of <=0.01 uIU/mL. Performed at [facility], [address], [place], [place] [address]     Blood Alcohol level:  Lab Results  Component Value Date   [facility] <10 12/19/2022   ETH <10 11/21/2019   Metabolic Disorder Labs: Lab Results  Component Value Date   HGBA1C 5.6 06/16/2023   MPG 114.02 06/16/2023   MPG 79.58 12/21/2022   No results found for: "PROLACTIN" Lab Results  Component Value Date   CHOL 256 (H) 06/16/2023   TRIG 224 (H) 06/16/2023   HDL 83 06/16/2023   CHOLHDL 3.1 06/16/2023   VLDL 45 (H) 06/16/2023   LDLCALC 128 (H) 06/16/2023   LDLCALC 149 (H) 12/21/2022   Physical Findings: AIMS: Facial and Oral  Movements Muscles of Facial Expression: None Lips and Perioral Area: None Jaw:  None Tongue: None,Extremity Movements Upper (arms, wrists, hands, fingers): None Lower (legs, knees, ankles, toes): None, Trunk Movements Neck, shoulders, hips: None, Global Judgements Severity of abnormal movements overall : None Incapacitation due to abnormal movements: None Patient's awareness of abnormal movements: No Awareness, Dental Status Current problems with teeth and/or dentures?: No Does patient usually wear dentures?: No  CIWA:    COWS:     Musculoskeletal: Strength & Muscle Tone: within normal limits Gait & Station: normal Patient leans: N/A  Psychiatric Specialty Exam:  Presentation  General Appearance:  Casually dressed, overweight, not in any distress.  No EPS. Eye Contact: Fair  Speech: Slurred and slow.  Speech Volume: At baseline.  Mood and Affect  Mood: Depressed but not pervasively so. Affect: Blunted and mood congruent.  Thought Process  Thought Processes: Decreased speed of thoughts but goal-directed.  Descriptions of Associations:  Linear.  Orientation:Full (Time, Place and Person)  Thought Content: WNL  Hallucinations: No hallucination in any modality.   Sensorium  Memory: Recent Fair  Judgment: Fair  Insight: Fair   Chartered certified accountant: Fair  Attention Span: Fair  Recall: [employer] of Knowledge: Fair  Language: Fair   Psychomotor Activity  Psychomotor Activity: Psychomotor Activity: Normal    Physical Exam: Physical Exam HENT:     Nose: Nose normal.     Mouth/Throat:     Pharynx: Oropharynx is clear.  Cardiovascular:     Rate and Rhythm: Normal rate.     Pulses: Normal pulses.  Pulmonary:     Effort: Pulmonary effort is normal.  Genitourinary:    Comments: Deferred Musculoskeletal:     Cervical back: Normal range of motion.  Skin:    General: Skin is warm and dry.  Neurological:     General: No focal deficit present.     Mental Status: She is alert and oriented to  person, place, and time.  Psychiatric:        Mood and Affect: Mood normal.        Behavior: Behavior normal.        Thought Content: Thought content normal.        Judgment: Judgment normal.    Review of Systems  Constitutional:  Negative for chills, diaphoresis and fever.  HENT:  Negative for congestion and sore throat.   Respiratory:  Negative for cough, shortness of breath and wheezing.   Cardiovascular:  Negative for chest pain and palpitations.  Gastrointestinal:  Negative for abdominal pain, diarrhea, heartburn, nausea and vomiting.  Musculoskeletal:  Negative for joint pain and myalgias.  Neurological:  Negative for dizziness, tingling, tremors, sensory change, speech change, focal weakness, seizures, loss of consciousness, weakness and headaches.  Psychiatric/Behavioral:  Positive for depression (stable). Negative for hallucinations, memory loss, substance abuse and suicidal ideas. The patient is nervous/anxious (Stable) and has insomnia (Stable).    Blood pressure (!) 82/52, pulse 71, temperature 97.9 F (36.6 C), temperature source Oral, resp. rate 16, height 4\' 11"  (1.499 m), weight 63 kg, SpO2 98%. Body mass index is 28.05 kg/m.  Treatment Plan Summary: Currently mentally stable for discharge.  Medications: 1.  Continue Invega  ER 6 mg at bedtime. 2.  Continue sertraline  150 mg daily. 3.  Continue trazodone  50 mg as needed for insomnia. 4.  Continue Buspar  15 mg po bid for anxiety. 5.  Continue Hydroxyzine  25 mg po tid prn for anxiety. 6.  Continue Melatonin 5 mg  po q bedtime for sleep. 7.  Continue Miralax  17 gm po daily prn for constipation. 8.  Continue Vitamin D1.25 (50,000 units daily for bone health.  CSW is continuing to coordinate discharge planning with DSS. We are continuing Q15 minute checks & encouraging participating in group activities.   Lennard Quirk, NP, pmhnp. 07/24/2023, 9:37 AM Patient ID: Yolanda [name], female   DOB: 1976/06/08, 48  y.o.   Patient ID: Yolanda [name], female   DOB: 12/15/1975, 48 y.o.   MRN: [id number]

## 2023-07-24 NOTE — Plan of Care (Signed)
   Problem: Activity: Goal: Interest or engagement in activities will improve Outcome: Progressing   Problem: Coping: Goal: Ability to verbalize frustrations and anger appropriately will improve Outcome: Progressing   Problem: Safety: Goal: Periods of time without injury will increase Outcome: Progressing

## 2023-07-24 NOTE — Plan of Care (Signed)
  Problem: Activity: Goal: Interest or engagement in activities will improve 07/24/2023 1146 by Jeoffrey Mole, RN Outcome: Progressing 07/24/2023 1145 by Jeoffrey Mole, RN Outcome: Progressing   Problem: Coping: Goal: Ability to verbalize frustrations and anger appropriately will improve 07/24/2023 1146 by Jeoffrey Mole, RN Outcome: Progressing 07/24/2023 1145 by Jeoffrey Mole, RN Outcome: Progressing   Problem: Safety: Goal: Periods of time without injury will increase 07/24/2023 1146 by Jeoffrey Mole, RN Outcome: Progressing 07/24/2023 1145 by Jeoffrey Mole, RN Outcome: Progressing

## 2023-07-24 NOTE — Progress Notes (Signed)
   07/23/23 2045  Psych Admission Type (Psych Patients Only)  Admission Status Voluntary  Psychosocial Assessment  Patient Complaints Anxiety;Depression  Eye Contact Fair  Facial Expression Anxious;Sad  Affect Anxious;Sad;Depressed  Speech Logical/coherent  Interaction Childlike  Motor Activity Tremors  Appearance/Hygiene Disheveled  Behavior Characteristics Appropriate to situation;Cooperative  Mood Depressed  Aggressive Behavior  Effect No apparent injury  Thought Process  Coherency WDL  Content WDL  Delusions None reported or observed  Perception WDL  Hallucination None reported or observed  Judgment WDL  Confusion WDL  Danger to Self  Current suicidal ideation? Denies  Self-Injurious Behavior No self-injurious ideation or behavior indicators observed or expressed   Agreement Not to Harm Self Yes  Description of Agreement verbal  Danger to Others  Danger to Others None reported or observed  Danger to Others Abnormal  Harmful Behavior to others No threats or harm toward other people  Destructive Behavior No threats or harm toward property

## 2023-07-25 DIAGNOSIS — F251 Schizoaffective disorder, depressive type: Secondary | ICD-10-CM | POA: Diagnosis not present

## 2023-07-25 MED ORDER — SODIUM CHLORIDE 0.9 % IN NEBU
INHALATION_SOLUTION | RESPIRATORY_TRACT | Status: AC
  Filled 2023-07-25: qty 3

## 2023-07-25 NOTE — Plan of Care (Signed)
  Problem: Safety: Goal: Periods of time without injury will increase Outcome: Progressing   Problem: Medication: Goal: Compliance with prescribed medication regimen will improve Outcome: Progressing   

## 2023-07-25 NOTE — Progress Notes (Signed)
   07/25/23 1100  Psych Admission Type (Psych Patients Only)  Admission Status Involuntary  Psychosocial Assessment  Patient Complaints Worrying  Eye Contact Fair  Facial Expression Animated  Affect Appropriate to circumstance  Speech Soft  Interaction Childlike  Motor Activity Tremors  Appearance/Hygiene Disheveled  Behavior Characteristics Cooperative  Mood Anxious;Depressed;Pleasant  Thought Process  Coherency WDL  Content WDL  Delusions None reported or observed  Perception WDL  Hallucination None reported or observed  Judgment Limited  Confusion None  Danger to Self  Current suicidal ideation? Denies  Self-Injurious Behavior No self-injurious ideation or behavior indicators observed or expressed   Agreement Not to Harm Self Yes  Description of Agreement agreed to contact staff before acting on harmful thoughts

## 2023-07-25 NOTE — Progress Notes (Signed)
   07/25/23 0559  15 Minute Checks  Location Bedroom  Visual Appearance Calm  Behavior Sleeping  Sleep (Behavioral Health Patients Only)  Calculate sleep? (Click Yes once per 24 hr at 0600 safety check) Yes  Documented sleep last 24 hours 5

## 2023-07-25 NOTE — [facility] Group Notes (Signed)
[facility] Group Notes:  (Nursing/MHT/Case Management/Adjunct)  Date:  07/25/2023  Time:  8:59 PM  Type of Therapy:   Wrap-up group  Participation Level:  Did Not Attend  Participation Quality:    Affect:    Cognitive:    Insight:    Engagement in Group:    Modes of Intervention:    Summary of Progress/Problems: Refused grpup  Noah Delaine 07/25/2023, 8:59 PM

## 2023-07-25 NOTE — Group Note (Signed)
Date:  07/25/2023 Time:  5:21 PM  Group Topic/Focus:  Nutrition Group    Participation Level:  Active  Participation Quality:  Appropriate  Affect:  Appropriate  Cognitive:  Appropriate  Insight: Appropriate  Engagement in Group:  Engaged  Modes of Intervention:  Education  Additional Comments:   Pt attended the Nutrition group.  Yolanda [name] 07/25/2023, 5:21 PM

## 2023-07-25 NOTE — Group Note (Signed)
Date:  07/25/2023 Time:  5:02 PM  Group Topic/Focus:  Dimensions of Wellness:   The focus of this group is to introduce the topic of wellness and discuss the role each dimension of wellness plays in total health. Emotional Education:   The focus of this group is to discuss what feelings/emotions are, and how they are experienced.    Participation Level:  Active  Participation Quality:  Appropriate  Affect:  Appropriate  Cognitive:  Appropriate  Insight: Appropriate  Engagement in Group:  Engaged  Modes of Intervention:  Activity and Education  Additional Comments:   Pt attended and participated in the Emotional Wellness group. Pt was attentive as MHT provided education on emotional wellness; definition, benefits, activities etc. Pt practiced the Chakra Balance meditation with the group.   Yolanda [name] 07/25/2023, 5:02 PM

## 2023-07-25 NOTE — Group Note (Signed)
Date:  07/25/2023 Time:  2:17 PM  Group Topic/Focus:  Goals Group:   The focus of this group is to help patients establish daily goals to achieve during treatment and discuss how the patient can incorporate goal setting into their daily lives to aide in recovery. Orientation:   The focus of this group is to educate the patient on the purpose and policies of crisis stabilization and provide a format to answer questions about their admission.  The group details unit policies and expectations of patients while admitted.    Participation Level:  Did Not Attend  Participation Quality:   n/a  Affect:   n/a  Cognitive:   n/a  Insight: None  Engagement in Group:   n/a  Modes of Intervention:   n/a  Additional Comments:   Pt did not attend.  Edmund Hilda Harvey Lingo 07/25/2023, 2:17 PM

## 2023-07-25 NOTE — Progress Notes (Signed)
   07/25/23 2013  Psych Admission Type (Psych Patients Only)  Admission Status Involuntary  Psychosocial Assessment  Patient Complaints Depression;Worrying  Eye Contact Fair  Facial Expression Sad  Affect Anxious;Depressed  Speech Soft  Interaction Childlike  Motor Activity Tremors  Appearance/Hygiene Disheveled  Behavior Characteristics Cooperative  Mood Depressed  Thought Process  Coherency WDL  Content WDL  Delusions None reported or observed  Perception WDL  Hallucination None reported or observed  Judgment Limited  Confusion None  Danger to Self  Current suicidal ideation? Passive  Description of Suicide Plan no plan  Agreement Not to Harm Self Yes  Description of Agreement verbal  Danger to Others  Danger to Others None reported or observed  Danger to Others Abnormal  Harmful Behavior to others No threats or harm toward other people  Destructive Behavior No threats or harm toward property

## 2023-07-25 NOTE — BHH Group Notes (Signed)

## 2023-07-25 NOTE — Group Note (Signed)
LCSW Group Therapy Note   Group Date: 07/25/2023 Start Time: 1100 End Time: 1200  Type of Therapy and Topic:  Group Therapy - Who Am I?  Participation Level:  PT DID NOT ATTEND  Description of Group The focus of this group was to aid patients in self-exploration and awareness. Patients were guided in exploring various factors of oneself to include interests, readiness to change, management of emotions, and individual perception of self. Patients were provided with complementary worksheets exploring hidden talents, ease of asking other for help, music/media preferences, understanding and responding to feelings/emotions, and hope for the future. At group closing, patients were encouraged to adhere to discharge plan to assist in continued self-exploration and understanding.   Therapeutic Modalities Cognitive Behavioral Therapy Motivational Interviewing   Kathrynn Humble 07/25/2023  6:28 PM

## 2023-07-25 NOTE — Progress Notes (Signed)
[facility] MD Progress Note  07/25/2023 12:13 PM Yolanda [name]  MRN:  [id number] HPI:   48 year old African-American female, single, on SSI disability, homeless.  Background history of intellectual disability, substance use disorder and schizoaffective disorder depressive type. Presented via emergency services on account of worsening depression associated with suicidal thoughts. Expressed thoughts of suicide by stabbing herself.  Chart Review from last 24 hours:  The patient's chart and nursing notes were reviewed. The patient's case was discussed in multidisciplinary team meeting. Vital signs within defined limits. Patient encouraged to increase oral fluid intake. Patient is co mpliant with routine medication regimen without difficulty. Sleep hours last night: 5.0 hours, as documented in the nursing flow sheets. No behavioral episodes or nursing concerns were reported at this time. She required PRN Tylenol for back, according to the nursing record.  Patient assessment: During today's encounter the patient was observed lying in bed awake.  On assessment today, she endorses depression due to prolonged hospitalization. She denies anxiety symptoms. She reports sleep and appetite are "good."  She denies suicidal or homicidal ideation. She denies auditory or visual hallucinations, paranoia, or delusional thought content.  During the assessment, the patient became tearful and stating, "I want to go home. Why can't I just go home. Why is Medicaid taking so long."  Emotional support and active listening provided, patient encouraged to remain positive and reassured Social Work is working diligently to seek appropriate housing.      She remains stable from a psychiatric standpoint. Medication regimen will remain the same with no adjustments warranted today.   Labs Reviewed: FT3 & FT4 were slightly subtherapeutic 2 weeks ago at 1.9 & 0.56 respectively. Repeated and FT3 is WNL & FT4 is still slightly low at 0.56.  Patient has a history of hypothyroidism & is being treated with Levothyroxine.  Principal Problem: Schizoaffective disorder, depressive type (HCC) Diagnosis: Principal Problem:   Schizoaffective disorder, depressive type (HCC) Active Problems:   GERD (gastroesophageal reflux disease)   Intellectual disability   Tobacco use disorder  Total Time spent with patient: 20 minutes  Past Psychiatric History: Extensive history of mental illness.  She has been tried on multiple medications over the years.  See H&P for full details.  Past Medical History:  Past Medical History:  Diagnosis Date   Bipolar affect, depressed (HCC)    Constipation 08/17/2022   Depression    Falls 07/21/2022   Fracture of femoral neck, right, closed (HCC) 01/17/2022   Herpes simplex 08/22/2017   Open fracture dislocation of right elbow joint 01/17/2022    Past Surgical History:  Procedure Laterality Date   NO PAST SURGERIES     SALPINGECTOMY     Family History: History reviewed. No pertinent family history. Family Psychiatric  History: Extensive family history of mood disorder. Social History:  Social History   Substance and Sexual Activity  Alcohol Use Yes     Social History   Substance and Sexual Activity  Drug Use Yes   Types: Cocaine, Marijuana    Social History   Socioeconomic History   Marital status: Single    Spouse name: Not on file   Number of children: Not on file   Years of education: Not on file   Highest education level: Not on file  Occupational History   Not on file  Tobacco Use   Smoking status: Every Day   Smokeless tobacco: Not on file  Substance and Sexual Activity   Alcohol use: Yes   Drug use: Yes  Types: Cocaine, Marijuana   Sexual activity: Yes  Other Topics Concern   Not on file  Social History Narrative   Not on file   Social Drivers of Health   Financial Resource Strain: Low Risk  (09/12/2022)   Received from [facility], [facility]   Overall  Financial Resource Strain (CARDIA)    Difficulty of Paying Living Expenses: Not hard at all  Food Insecurity: Patient Declined (12/20/2022)   Hunger Vital Sign    Worried About Running Out of Food in the Last Year: Patient declined    Ran Out of Food in the Last Year: Patient declined  Transportation Needs: No Transportation Needs (12/20/2022)   PRAPARE - Administrator, Civil Service (Medical): No    Lack of Transportation (Non-Medical): No  Physical Activity: Not on file  Stress: No Stress Concern Present (07/17/2022)   Received from [facility], [facility] of Occupational Health - Occupational Stress Questionnaire    Feeling of Stress : Not at all  Social Connections: Unknown (07/16/2022)   Received from [facility]   Social Network    Social Network: Not on file    Current Medications: Current Facility-Administered Medications  Medication Dose Route Frequency Provider Last Rate Last Admin   acetaminophen (TYLENOL) tablet 650 mg  650 mg Oral Q6H PRN [name], Yolanda [name]   650 mg at 07/24/23 2127   alum & mag hydroxide-simeth (MAALOX/MYLANTA) 200-200-20 MG/5ML suspension 30 mL  30 mL Oral Q4H PRN Onuoha, Chinwendu V, Yolanda [name]   30 mL at 05/31/23 0801   atorvastatin (LIPITOR) tablet 10 mg  10 mg Oral Daily [name], Yolanda [name]   10 mg at 07/24/23 1817   busPIRone (BUSPAR) tablet 15 mg  15 mg Oral BID [name], Yolanda [name]   15 mg at 07/25/23 0818   cyanocobalamin (VITAMIN B12) injection 1,000 mcg  1,000 mcg Intramuscular Q30 days Sarita Bottom, MD   1,000 mcg at 07/03/23 1009   diphenhydrAMINE (BENADRYL) capsule 50 mg  50 mg Oral TID PRN [name], Yolanda [name]   50 mg at 06/03/23 1110   Or   diphenhydrAMINE (BENADRYL) injection 50 mg  50 mg Intramuscular TID PRN [name], Yolanda [name]       docusate sodium (COLACE) capsule 100 mg  100 mg Oral Daily Nkwenti, Doris, Yolanda [name]   100 mg at 07/25/23 0818   haloperidol (HALDOL) tablet 5 mg  5 mg Oral TID PRN Armandina Stammer  I, Yolanda [name]   5 mg at 06/03/23 1110   Or   haloperidol lactate (HALDOL) injection 5 mg  5 mg Intramuscular TID PRN [name], Yolanda [name]       hydrocortisone cream 1 %   Topical BID Ntuen, Jesusita Oka, FNP   Given at 07/24/23 0800   hydrOXYzine (ATARAX) tablet 25 mg  25 mg Oral TID PRN [name], DO   25 mg at 07/22/23 2023   levothyroxine (SYNTHROID) tablet 25 mcg  25 mcg Oral Q0600 Golda Acre, MD   25 mcg at 07/25/23 1610   LORazepam (ATIVAN) tablet 2 mg  2 mg Oral TID PRN [name], Yolanda [name]   2 mg at 06/03/23 1110   Or   LORazepam (ATIVAN) injection 2 mg  2 mg Intramuscular TID PRN [name], Yolanda [name]       magnesium hydroxide (MILK OF MAGNESIA) suspension 30 mL  30 mL Oral Daily PRN [name], Yolanda [name]   30 mL at 07/18/23 1701  melatonin tablet 5 mg  5 mg Oral QHS Nkwenti, Doris, Yolanda [name]   5 mg at 07/24/23 2127   naphazoline-pheniramine (NAPHCON-A) 0.025-0.3 % ophthalmic solution 1 drop  1 drop Both Eyes TID [name], Yolanda [name]   1 drop at 07/25/23 0818   nicotine polacrilex (NICORETTE) gum 2 mg  2 mg Oral PRN [name], MD   2 mg at 07/22/23 1413   paliperidone (INVEGA) 24 hr tablet 6 mg  6 mg Oral Daily Nkwenti, Tyler Aas, Yolanda [name]   6 mg at 07/24/23 2127   pantoprazole (PROTONIX) EC tablet 40 mg  40 mg Oral Daily Nwoko, Nicole Kindred I, Yolanda [name]   40 mg at 07/25/23 0818   polyethylene glycol (MIRALAX / GLYCOLAX) packet 17 g  17 g Oral Daily PRN [name], MD   17 g at 06/07/23 1847   selenium sulfide (SELSUN) 1 % shampoo   Topical Daily PRN Georgiann Cocker, MD   Given at 07/20/23 2218   sertraline (ZOLOFT) tablet 150 mg  150 mg Oral Daily Massengill, Harrold Donath, MD   150 mg at 07/25/23 0818   SUMAtriptan (IMITREX) tablet 25 mg  25 mg Oral BID PRN [name], Yolanda [name]   25 mg at 07/23/23 1828   traZODone (DESYREL) tablet 50 mg  50 mg Oral QHS [name], Yolanda [name]   50 mg at 07/24/23 2127   Vitamin D (Ergocalciferol) (DRISDOL) 1.25 MG (50000 UNIT) capsule 50,000 Units  50,000 Units Oral Q7 days [name], Yolanda [name]    50,000 Units at 07/20/23 1712   Lab Results:  No results found for this or any previous visit (from the past 48 hours).   Blood Alcohol level:  Lab Results  Component Value Date   ETH <10 12/19/2022   ETH <10 11/21/2019   Metabolic Disorder Labs: Lab Results  Component Value Date   HGBA1C 5.6 06/16/2023   MPG 114.02 06/16/2023   MPG 79.58 12/21/2022   No results found for: "PROLACTIN" Lab Results  Component Value Date   CHOL 256 (H) 06/16/2023   TRIG 224 (H) 06/16/2023   HDL 83 06/16/2023   CHOLHDL 3.1 06/16/2023   VLDL 45 (H) 06/16/2023   LDLCALC 128 (H) 06/16/2023   LDLCALC 149 (H) 12/21/2022   Physical Findings: AIMS: Facial and Oral Movements Muscles of Facial Expression: None Lips and Perioral Area: None Jaw: None Tongue: None,Extremity Movements Upper (arms, wrists, hands, fingers): None Lower (legs, knees, ankles, toes): None, Trunk Movements Neck, shoulders, hips: None, Global Judgements Severity of abnormal movements overall : None Incapacitation due to abnormal movements: None Patient's awareness of abnormal movements: No Awareness, Dental Status Current problems with teeth and/or dentures?: No Does patient usually wear dentures?: No  CIWA:    COWS:     Musculoskeletal: Strength & Muscle Tone: within normal limits Gait & Station: normal Patient leans: N/A  Psychiatric Specialty Exam:  Presentation  General Appearance:  Casually dressed, overweight, not in any distress.  No EPS. Eye Contact: Fair  Speech: Slurred and slow.  Speech Volume: At baseline.  Mood and Affect  Mood: Depressed but not pervasively so. Affect: Blunted and mood congruent.  Thought Process  Thought Processes: Decreased speed of thoughts but goal-directed.  Descriptions of Associations:  Linear.  Orientation:Full (Time, Place and Person)  Thought Content: WNL  Hallucinations: No hallucination in any modality.   Sensorium  Memory: Immediate  Good  Judgment: Fair  Insight: Fair   Chartered certified accountant: Fair  Attention Span: Fair  Recall: [employer]  of Knowledge: Fair  Language: Fair   Lexicographer Activity: Psychomotor Activity: Normal     Physical Exam: Physical Exam HENT:     Nose: Nose normal.     Mouth/Throat:     Pharynx: Oropharynx is clear.  Cardiovascular:     Rate and Rhythm: Normal rate.     Pulses: Normal pulses.  Pulmonary:     Effort: Pulmonary effort is normal.  Genitourinary:    Comments: Deferred Musculoskeletal:     Cervical back: Normal range of motion.  Skin:    General: Skin is warm and dry.  Neurological:     General: No focal deficit present.     Mental Status: She is alert and oriented to person, place, and time.  Psychiatric:        Mood and Affect: Mood normal.        Behavior: Behavior normal.        Thought Content: Thought content normal.        Judgment: Judgment normal.    Review of Systems  Constitutional:  Negative for chills, diaphoresis and fever.  HENT:  Negative for congestion and sore throat.   Respiratory:  Negative for cough, shortness of breath and wheezing.   Cardiovascular:  Negative for chest pain and palpitations.  Gastrointestinal:  Negative for abdominal pain, diarrhea, heartburn, nausea and vomiting.  Musculoskeletal:  Negative for joint pain and myalgias.  Neurological:  Negative for dizziness, tingling, tremors, sensory change, speech change, focal weakness, seizures, loss of consciousness, weakness and headaches.  Psychiatric/Behavioral:  Positive for depression (stable). Negative for hallucinations, memory loss, substance abuse and suicidal ideas. The patient is nervous/anxious (Stable) and has insomnia (Stable).    Blood pressure 115/79, pulse 70, temperature 97.9 F (36.6 C), resp. rate 19, height 4\' 11"  (1.499 m), weight 63 kg, SpO2 99%. Body mass index is 28.05 kg/m.  Treatment Plan Summary:  Currently mentally stable for discharge.  Medications: 1.  Continue Invega ER 6 mg at bedtime. 2.  Continue sertraline 150 mg daily. 3.  Continue trazodone 50 mg as needed for insomnia. 4.  Continue Buspar 15 mg po bid for anxiety. 5.  Continue Hydroxyzine 25 mg po tid prn for anxiety. 6.  Continue Melatonin 5 mg po q bedtime for sleep. 7.  Continue Miralax 17 gm po daily prn for constipation. 8.  Continue Vitamin D1.25 (50,000 units daily for bone health.  CSW is continuing to coordinate discharge planning with DSS. We are continuing Q15 minute checks & encouraging participating in group activities.   Yolanda Fredrickson, Yolanda [name], Yolanda [name]. 07/25/2023, 12:13 PM Patient ID: Yolanda [name], female   DOB: 11-30-1975, 48 y.o.   Patient ID: Yolanda [name], female   DOB: Aug 27, 1975, 48 y.o.   MRN: [id number] Patient ID: Yolanda [name], female   DOB: [birth year], 48 y.o.   MRN: [id number]

## 2023-07-25 NOTE — Plan of Care (Signed)
  Problem: Medication: Goal: Compliance with prescribed medication regimen will improve Outcome: Progressing   

## 2023-07-25 NOTE — Plan of Care (Signed)
  Problem: Coping: Goal: Coping ability will improve Outcome: Progressing   Problem: Self-Concept: Goal: Ability to disclose and discuss suicidal ideas will improve Outcome: Progressing

## 2023-07-25 NOTE — Progress Notes (Signed)
[name] mother Raeghan Paulino [phone] present at [facility] to pick up some of pt's clothes due to pt having several bags in [facility] storage area. CSW and TOC Supervisor, [name] spoke with pt's mother regarding the amount of time pt has been at [facility] and how it has been affecting pt. [name] mother shared she understands that pt can't continue to stay at the hospital however she can sleep knowing pt is safe. [name] mother shared several incidences of pt's suicidal attempts and self harm behaviors which has caused apprehension to allow pt returning home. CSW shared that we can pursue ACTT services and admission into a Day Program for continued services. Mrs. Capote shared that she would like to meet regarding pt's discharge plan. CSW will continue to follow and update team.

## 2023-07-25 NOTE — Progress Notes (Signed)
   07/24/23 2127  Psych Admission Type (Psych Patients Only)  Admission Status Voluntary  Psychosocial Assessment  Patient Complaints Depression  Eye Contact Fair  Facial Expression Sad  Affect Anxious;Depressed  Speech Soft  Interaction Childlike  Motor Activity Tremors  Appearance/Hygiene Disheveled  Behavior Characteristics Cooperative  Mood Depressed;Pleasant  Thought Process  Coherency WDL  Content WDL  Delusions None reported or observed  Perception WDL  Hallucination None reported or observed  Judgment Limited  Confusion None  Danger to Self  Current suicidal ideation? Denies  Self-Injurious Behavior No self-injurious ideation or behavior indicators observed or expressed   Agreement Not to Harm Self Yes  Description of Agreement verbal  Danger to Others  Danger to Others None reported or observed  Danger to Others Abnormal  Harmful Behavior to others No threats or harm toward other people  Destructive Behavior No threats or harm toward property

## 2023-07-25 NOTE — Progress Notes (Signed)
Pt met with [facility] ACTT services [phone] who reported they are able to bring pt on board as a client upon discharge from hospital. ACTT reported they will be able to see pt 2-3 days a week and will facilitate entry into a Day Program for the remainder of days. CSW will continue to follow and update team.

## 2023-07-26 ENCOUNTER — Encounter (HOSPITAL_COMMUNITY): Payer: Self-pay

## 2023-07-26 DIAGNOSIS — F251 Schizoaffective disorder, depressive type: Secondary | ICD-10-CM | POA: Diagnosis not present

## 2023-07-26 NOTE — Group Note (Signed)
Date:  07/26/2023 Time:  4:10 PM  Group Topic/Focus:  Goals Group:   The focus of this group is to help patients establish daily goals to achieve during treatment and discuss how the patient can incorporate goal setting into their daily lives to aide in recovery. Orientation:   The focus of this group is to educate the patient on the purpose and policies of crisis stabilization and provide a format to answer questions about their admission.  The group details unit policies and expectations of patients while admitted.    Participation Level:  Did Not Attend  Participation Quality:   n/a  Affect:   n/a  Cognitive:   n/a  Insight: None  Engagement in Group:   n/a  Modes of Intervention:   n/a  Additional Comments:   Pt did not attend.  Edmund Hilda Melady Chow 07/26/2023, 4:10 PM

## 2023-07-26 NOTE — Plan of Care (Signed)
  Problem: Education: Goal: Knowledge of [facility] Education information/materials will improve Outcome: Progressing Goal: Emotional status will improve Outcome: Progressing Goal: Mental status will improve Outcome: Progressing Goal: Verbalization of understanding the information provided will improve Outcome: Progressing   Problem: Activity: Goal: Interest or engagement in activities will improve Outcome: Progressing Goal: Sleeping patterns will improve Outcome: Progressing   Problem: Coping: Goal: Ability to verbalize frustrations and anger appropriately will improve Outcome: Progressing Goal: Ability to demonstrate self-control will improve Outcome: Progressing   Problem: Health Behavior/Discharge Planning: Goal: Identification of resources available to assist in meeting health care needs will improve Outcome: Progressing Goal: Compliance with treatment plan for underlying cause of condition will improve Outcome: Progressing   Problem: Physical Regulation: Goal: Ability to maintain clinical measurements within normal limits will improve Outcome: Progressing   Problem: Safety: Goal: Periods of time without injury will increase Outcome: Progressing   Problem: Education: Goal: Ability to make informed decisions regarding treatment will improve Outcome: Progressing   Problem: Coping: Goal: Coping ability will improve Outcome: Progressing   Problem: Health Behavior/Discharge Planning: Goal: Identification of resources available to assist in meeting health care needs will improve Outcome: Progressing   Problem: Medication: Goal: Compliance with prescribed medication regimen will improve Outcome: Progressing   Problem: Self-Concept: Goal: Ability to disclose and discuss suicidal ideas will improve Outcome: Progressing Goal: Will verbalize positive feelings about self Outcome: Progressing Note: Patient is on track. Patient will avoid flare triggers Pt  reports she talks with staff when agitated.    Problem: Education: Goal: Ability to state activities that reduce stress will improve Outcome: Progressing

## 2023-07-26 NOTE — Plan of Care (Signed)
  Problem: Medication: Goal: Compliance with prescribed medication regimen will improve Outcome: Progressing   Problem: Coping: Goal: Ability to demonstrate self-control will improve Outcome: Progressing

## 2023-07-26 NOTE — Progress Notes (Signed)
Pt extremely tearful and anxious after dinner. Was in her room crying saying " I can't go home but I want to  go home".  Pt pointed to the sky indicating (going to heaven).  Pt was listened to and redirected. It was reiterated that she will not be at [facility] forever.  Pt given prn medication for anxiety and encouraged to interact with peers.  Currently she is at the nurses station with peers talking with staff. Will cont to monitor for safety.

## 2023-07-26 NOTE — Group Note (Signed)
Date:  07/26/2023 Time:  4:48 PM  Group Topic/Focus:  Dimensions of Wellness:   The focus of this group is to introduce the topic of wellness and discuss the role each dimension of wellness plays in total health.    Participation Level:  Did Not Attend  Participation Quality:   n/a  Affect:   n/a  Cognitive:   n/a  Insight: None  Engagement in Group:   n/a  Modes of Intervention:   n/a  Additional Comments:   Pt did not attend.  Yolanda [name] 07/26/2023, 4:48 PM

## 2023-07-26 NOTE — Progress Notes (Signed)
   07/26/23 1600  Psych Admission Type (Psych Patients Only)  Admission Status Involuntary  Psychosocial Assessment  Patient Complaints Depression  Eye Contact Fair  Facial Expression Sad  Affect Depressed  Speech Soft  Interaction Childlike  Motor Activity Tremors  Appearance/Hygiene Disheveled  Behavior Characteristics Cooperative  Mood Depressed  Thought Process  Coherency WDL  Content WDL  Delusions None reported or observed  Perception WDL  Hallucination None reported or observed  Judgment Limited  Confusion None  Danger to Self  Current suicidal ideation? Denies  Agreement Not to Harm Self Yes  Description of Agreement verbal  Danger to Others  Danger to Others None reported or observed  Danger to Others Abnormal  Harmful Behavior to others No threats or harm toward other people  Destructive Behavior No threats or harm toward property

## 2023-07-26 NOTE — Group Note (Signed)
Recreation Therapy Group Note   Group Topic:Leisure Education  Group Date: 07/26/2023 Start Time: 0933 End Time: 1009 Facilitators: Gjon Letarte-McCall, LRT,CTRS Location: [address] Dayroom   Group Topic: Leisure Education  Goal Area(s) Addresses:  Patient will identify positive leisure activities.  Patient will identify one positive benefit of participation in leisure activities.   Intervention: Leisure Group Game  Activity: Patient, MHT, and LRT participated in playing a trivia game of Guess the [place]. LRT and patients discussed what leisure is, what examples of leisure activities are, where you can participate in leisure and why leisure is important.   Education:  Leisure Exposure, Pharmacist, community, Discharge Planning  Education Outcome: Acknowledges education/In group clarification offered/Needs additional education   Affect/Mood: N/A   Participation Level: Did not attend    Clinical Observations/Individualized Feedback:     Plan: Continue to engage patient in RT group sessions 2-3x/week.   Majd Tissue-McCall, LRT,CTRS 07/26/2023 12:17 PM

## 2023-07-26 NOTE — Progress Notes (Signed)
   07/26/23 2118  Psych Admission Type (Psych Patients Only)  Admission Status Involuntary  Psychosocial Assessment  Patient Complaints Depression  Eye Contact Fair  Facial Expression Sad  Affect Depressed  Speech Soft  Interaction Childlike  Motor Activity Tremors  Appearance/Hygiene Disheveled  Behavior Characteristics Cooperative  Mood Depressed;Sad  Thought Process  Coherency WDL  Content WDL  Delusions None reported or observed  Perception WDL  Hallucination None reported or observed  Judgment Limited  Confusion None  Danger to Self  Current suicidal ideation? Denies  Agreement Not to Harm Self Yes  Description of Agreement verbal  Danger to Others  Danger to Others None reported or observed  Danger to Others Abnormal  Harmful Behavior to others No threats or harm toward other people  Destructive Behavior No threats or harm toward property

## 2023-07-26 NOTE — BH IP Treatment Plan (Signed)
Interdisciplinary Treatment and Diagnostic Plan Update  07/26/2023 Time of Session: 11:15AM - UPDATE Yolanda [name] MRN: [id number]  Principal Diagnosis: Schizoaffective disorder, depressive type (HCC)  Secondary Diagnoses: Principal Problem:   Schizoaffective disorder, depressive type (HCC) Active Problems:   GERD (gastroesophageal reflux disease)   Intellectual disability   Tobacco use disorder   Current Medications:  Current Facility-Administered Medications  Medication Dose Route Frequency Provider Last Rate Last Admin   acetaminophen (TYLENOL) tablet 650 mg  650 mg Oral Q6H PRN [name], NP   650 mg at 07/25/23 2013   alum & mag hydroxide-simeth (MAALOX/MYLANTA) 200-200-20 MG/5ML suspension 30 mL  30 mL Oral Q4H PRN Onuoha, Chinwendu V, NP   30 mL at 05/31/23 0801   atorvastatin (LIPITOR) tablet 10 mg  10 mg Oral Daily [name], NP   10 mg at 07/25/23 1713   busPIRone (BUSPAR) tablet 15 mg  15 mg Oral BID [name], NP   15 mg at 07/26/23 0102   cyanocobalamin (VITAMIN B12) injection 1,000 mcg  1,000 mcg Intramuscular Q30 days Sarita Bottom, MD   1,000 mcg at 07/03/23 1009   diphenhydrAMINE (BENADRYL) capsule 50 mg  50 mg Oral TID PRN [name], NP   50 mg at 06/03/23 1110   Or   diphenhydrAMINE (BENADRYL) injection 50 mg  50 mg Intramuscular TID PRN [name], NP       docusate sodium (COLACE) capsule 100 mg  100 mg Oral Daily Nkwenti, Doris, NP   100 mg at 07/26/23 7253   haloperidol (HALDOL) tablet 5 mg  5 mg Oral TID PRN [name], NP   5 mg at 06/03/23 1110   Or   haloperidol lactate (HALDOL) injection 5 mg  5 mg Intramuscular TID PRN [name], NP       hydrocortisone cream 1 %   Topical BID Cecilie Lowers, FNP   Given at 07/26/23 6644   hydrOXYzine (ATARAX) tablet 25 mg  25 mg Oral TID PRN [name], DO   25 mg at 07/22/23 2023   levothyroxine (SYNTHROID) tablet 25 mcg  25 mcg Oral Q0600 Golda Acre, MD   25 mcg at 07/26/23 0630    LORazepam (ATIVAN) tablet 2 mg  2 mg Oral TID PRN [name], NP   2 mg at 06/03/23 1110   Or   LORazepam (ATIVAN) injection 2 mg  2 mg Intramuscular TID PRN [name], NP       magnesium hydroxide (MILK OF MAGNESIA) suspension 30 mL  30 mL Oral Daily PRN [name], NP   30 mL at 07/18/23 1701   melatonin tablet 5 mg  5 mg Oral QHS Nkwenti, Doris, NP   5 mg at 07/25/23 2141   naphazoline-pheniramine (NAPHCON-A) 0.025-0.3 % ophthalmic solution 1 drop  1 drop Both Eyes TID [name], NP   1 drop at 07/26/23 0347   nicotine polacrilex (NICORETTE) gum 2 mg  2 mg Oral PRN [name], MD   2 mg at 07/25/23 1716   paliperidone (INVEGA) 24 hr tablet 6 mg  6 mg Oral Daily [name], NP   6 mg at 07/25/23 2141   pantoprazole (PROTONIX) EC tablet 40 mg  40 mg Oral Daily [name], NP   40 mg at 07/26/23 0903   polyethylene glycol (MIRALAX / GLYCOLAX) packet 17 g  17 g Oral Daily PRN [name], MD   17 g at 06/07/23 1847   selenium  sulfide (SELSUN) 1 % shampoo   Topical Daily PRN Georgiann Cocker, MD   Given at 07/20/23 2218   sertraline (ZOLOFT) tablet 150 mg  150 mg Oral Daily Massengill, Nathan, MD   150 mg at 07/26/23 5102   SUMAtriptan (IMITREX) tablet 25 mg  25 mg Oral BID PRN [name], NP   25 mg at 07/23/23 1828   traZODone (DESYREL) tablet 50 mg  50 mg Oral QHS [name], NP   50 mg at 07/25/23 2141   Vitamin D (Ergocalciferol) (DRISDOL) 1.25 MG (50000 UNIT) capsule 50,000 Units  50,000 Units Oral Q7 days [name], NP   50,000 Units at 07/20/23 1712   PTA Medications: Medications Prior to Admission  Medication Sig Dispense Refill Last Dose/Taking   busPIRone (BUSPAR) 15 MG tablet Take 15 mg by mouth 2 (two) times daily. (Patient not taking: Reported on 12/19/2022)      paliperidone (INVEGA SUSTENNA) 156 MG/ML SUSY injection Inject 156 mg into the muscle once. (Patient not taking: Reported on 12/19/2022)      sertraline (ZOLOFT) 50 MG  tablet Take 150 mg by mouth daily. (Patient not taking: Reported on 12/19/2022)      traZODone (DESYREL) 100 MG tablet Take 100 mg by mouth at bedtime as needed for sleep. (Patient not taking: Reported on 11/12/2022)       Patient Stressors: Medication change or noncompliance    Patient Strengths: Forensic psychologist fund of knowledge   Treatment Modalities: Medication Management, Group therapy, Case management,  1 to 1 session with clinician, Psychoeducation, Recreational therapy.   Physician Treatment Plan for Primary Diagnosis: Schizoaffective disorder, depressive type (HCC) Long Term Goal(s): Improvement in symptoms so as ready for discharge   Short Term Goals: Ability to identify changes in lifestyle to reduce recurrence of condition will improve Ability to verbalize feelings will improve  Medication Management: Evaluate patient's response, side effects, and tolerance of medication regimen.  Therapeutic Interventions: 1 to 1 sessions, Unit Group sessions and Medication administration.  Evaluation of Outcomes: Progressing  Physician Treatment Plan for Secondary Diagnosis: Principal Problem:   Schizoaffective disorder, depressive type (HCC) Active Problems:   GERD (gastroesophageal reflux disease)   Intellectual disability   Tobacco use disorder  Long Term Goal(s): Improvement in symptoms so as ready for discharge   Short Term Goals: Ability to identify changes in lifestyle to reduce recurrence of condition will improve Ability to verbalize feelings will improve     Medication Management: Evaluate patient's response, side effects, and tolerance of medication regimen.  Therapeutic Interventions: 1 to 1 sessions, Unit Group sessions and Medication administration.  Evaluation of Outcomes: Progressing   RN Treatment Plan for Primary Diagnosis: Schizoaffective disorder, depressive type (HCC) Long Term Goal(s): Knowledge of disease and therapeutic regimen to maintain  health will improve  Short Term Goals: Ability to remain free from injury will improve, Ability to participate in decision making will improve, and Ability to identify and develop effective coping behaviors will improve  Medication Management: RN will administer medications as ordered by provider, will assess and evaluate patient's response and provide education to patient for prescribed medication. RN will report any adverse and/or side effects to prescribing provider.  Therapeutic Interventions: 1 on 1 counseling sessions, Psychoeducation, Medication administration, Evaluate responses to treatment, Monitor vital signs and CBGs as ordered, Perform/monitor CIWA, COWS, AIMS and Fall Risk screenings as ordered, Perform wound care treatments as ordered.  Evaluation of Outcomes: Progressing   LCSW Treatment Plan for Primary Diagnosis:  Schizoaffective disorder, depressive type (HCC) Long Term Goal(s): Safe transition to appropriate next level of care at discharge, Engage patient in therapeutic group addressing interpersonal concerns.  Short Term Goals: Engage patient in aftercare planning with referrals and resources, Increase ability to appropriately verbalize feelings, and Facilitate patient progression through stages of change regarding substance use diagnoses and concerns  Therapeutic Interventions: Assess for all discharge needs, 1 to 1 time with Social worker, Explore available resources and support systems, Assess for adequacy in community support network, Educate family and significant other(s) on suicide prevention, Complete Psychosocial Assessment, Interpersonal group therapy.  Evaluation of Outcomes: Progressing   Progress in Treatment: Attending groups: No Participating in groups: No Taking medication as prescribed: Yes. Toleration medication: Yes. Family/Significant other contact made: Yes, individual(s) contacted:  Dalery Heitkamp [phone] Patient understands diagnosis:  Yes. Discussing patient identified problems/goals with staff: Yes. Medical problems stabilized or resolved: Yes. Denies suicidal/homicidal ideation: Yes. Issues/concerns per patient self-inventory: No.   New problem(s) identified: none reported   New Short Term/Long Term Goal(s): medication stabilization, elimination of SI thoughts, development of comprehensive mental wellness plan.      Patient Goals:  " placement"   Discharge Plan or Barriers: Patient recently admitted. CSW will continue to follow and assess for appropriate referrals and possible discharge planning.      Reason for Continuation of Hospitalization: Depression Suicidal ideation   Estimated Length of Stay: Pt still expected to discharge  to group home awaiting Single Case agreement and enhanced rate from [facility], otherwise patient will discharge home with mother and have [facility] (was picked up by [facility], will follow at discharge)  Last 3 [place] Suicide Severity Risk Score: Flowsheet Row Admission (Current) [facility] from 12/19/2022 in [facility] at [facility] ED from 11/12/2022 in [facility] Department at [facility]  C-SSRS RISK CATEGORY High Risk High Risk Moderate Risk       Last [facility] 2/9 Scores:     No data to display          Scribe for Treatment Team: Kathi Der, LCSWA 07/26/2023 12:40 PM

## 2023-07-26 NOTE — Progress Notes (Signed)
[facility] MD Progress Note  07/26/2023 12:39 PM Yolanda [name]  MRN:  [id number] HPI:   48 year old African-American female, single, on SSI disability, homeless.  Background history of intellectual disability, substance use disorder and schizoaffective disorder depressive type. Presented via emergency services on account of worsening depression associated with suicidal thoughts. Expressed thoughts of suicide by stabbing herself.  Chart Review from last 24 hours:  The patient's chart and nursing notes were reviewed. The patient's case was discussed in multidisciplinary team meeting. Vital signs are stable today. Patient is compliant with routine medication regimen without difficulty. Patient is sleeping well. No behavioral episodes or nursing concerns were reported at this time.   Patient assessment: Yolanda [name] is seen this morning. She presents alert, oriented & aware of situation. She is visible on the unit interacting with the staff. She remains without any new issues or problems. She is currently receiving some eye drops for complaint of eye irritations bilaterally. Her mood remains stable except that from time to time, patient will became a bit worried/tearful whether she will ever get into any group home setting so she can be discharged from the hospital. She continues on her treatment regimen, tolerating them well without any side effects. She currently denies any SIHI, AVH, delusional thoughts or paranoia. She does not appear to be responding to any internal stimuli. Will continue current plan of care as already in progress.  Labs Reviewed: FT3 & FT4 were slightly subtherapeutic 2 weeks ago at 1.9 & 0.56 respectively. Repeated and FT3 is WNL & FT4 is still slightly low at 0.56. Patient has a history of hypothyroidism & is being treated with Levothyroxine.  Principal Problem: Schizoaffective disorder, depressive type (HCC)  Diagnosis: Principal Problem:   Schizoaffective disorder, depressive type  (HCC) Active Problems:   GERD (gastroesophageal reflux disease)   Intellectual disability   Tobacco use disorder  Total Time spent with patient: 20 minutes  Past Psychiatric History: Extensive history of mental illness.  She has been tried on multiple medications over the years.  See H&P for full details.  Past Medical History:  Past Medical History:  Diagnosis Date   Bipolar affect, depressed (HCC)    Constipation 08/17/2022   Depression    Falls 07/21/2022   Fracture of femoral neck, right, closed (HCC) 01/17/2022   Herpes simplex 08/22/2017   Open fracture dislocation of right elbow joint 01/17/2022    Past Surgical History:  Procedure Laterality Date   NO PAST SURGERIES     SALPINGECTOMY      Family History: History reviewed. No pertinent family history.  Family Psychiatric  History: Extensive family history of mood disorder.  Social History:  Social History   Substance and Sexual Activity  Alcohol Use Yes     Social History   Substance and Sexual Activity  Drug Use Yes   Types: Cocaine, Marijuana    Social History   Socioeconomic History   Marital status: Single    Spouse name: Not on file   Number of children: Not on file   Years of education: Not on file   Highest education level: Not on file  Occupational History   Not on file  Tobacco Use   Smoking status: Every Day   Smokeless tobacco: Not on file  Substance and Sexual Activity   Alcohol use: Yes   Drug use: Yes    Types: Cocaine, Marijuana   Sexual activity: Yes  Other Topics Concern   Not on file  Social History Narrative  Not on file   Social Drivers of Health   Financial Resource Strain: Low Risk  (09/12/2022)   Received from [facility], [facility]   Overall Financial Resource Strain (CARDIA)    Difficulty of Paying Living Expenses: Not hard at all  Food Insecurity: Patient Declined (12/20/2022)   Hunger Vital Sign    Worried About Running Out of Food in the Last Year:  Patient declined    Ran Out of Food in the Last Year: Patient declined  Transportation Needs: No Transportation Needs (12/20/2022)   PRAPARE - Administrator, Civil Service (Medical): No    Lack of Transportation (Non-Medical): No  Physical Activity: Not on file  Stress: No Stress Concern Present (07/17/2022)   Received from [facility], [facility] - Occupational Stress Questionnaire    Feeling of Stress : Not at all  Social Connections: Unknown (07/16/2022)   Received from [facility], [facility]   Social Network    Social Network: Not on file   Current Medications: Current Facility-Administered Medications  Medication Dose Route Frequency Provider Last Rate Last Admin   acetaminophen (TYLENOL) tablet 650 mg  650 mg Oral Q6H PRN [name], NP   650 mg at 07/25/23 2013   alum & mag hydroxide-simeth (MAALOX/MYLANTA) 200-200-20 MG/5ML suspension 30 mL  30 mL Oral Q4H PRN Onuoha, Chinwendu V, NP   30 mL at 05/31/23 0801   atorvastatin (LIPITOR) tablet 10 mg  10 mg Oral Daily [name], NP   10 mg at 07/25/23 1713   busPIRone (BUSPAR) tablet 15 mg  15 mg Oral BID [name], NP   15 mg at 07/26/23 7846   cyanocobalamin (VITAMIN B12) injection 1,000 mcg  1,000 mcg Intramuscular Q30 days Sarita Bottom, MD   1,000 mcg at 07/03/23 1009   diphenhydrAMINE (BENADRYL) capsule 50 mg  50 mg Oral TID PRN [name], NP   50 mg at 06/03/23 1110   Or   diphenhydrAMINE (BENADRYL) injection 50 mg  50 mg Intramuscular TID PRN [name], NP       docusate sodium (COLACE) capsule 100 mg  100 mg Oral Daily Nkwenti, Doris, NP   100 mg at 07/26/23 9629   haloperidol (HALDOL) tablet 5 mg  5 mg Oral TID PRN [name], NP   5 mg at 06/03/23 1110   Or   haloperidol lactate (HALDOL) injection 5 mg  5 mg Intramuscular TID PRN [name], NP       hydrocortisone cream 1 %   Topical BID Cecilie Lowers, FNP   Given at 07/26/23 5284    hydrOXYzine (ATARAX) tablet 25 mg  25 mg Oral TID PRN [name], DO   25 mg at 07/22/23 2023   levothyroxine (SYNTHROID) tablet 25 mcg  25 mcg Oral Q0600 Golda Acre, MD   25 mcg at 07/26/23 0630   LORazepam (ATIVAN) tablet 2 mg  2 mg Oral TID PRN [name], NP   2 mg at 06/03/23 1110   Or   LORazepam (ATIVAN) injection 2 mg  2 mg Intramuscular TID PRN [name], NP       magnesium hydroxide (MILK OF MAGNESIA) suspension 30 mL  30 mL Oral Daily PRN [name], NP   30 mL at 07/18/23 1701   melatonin tablet 5 mg  5 mg Oral QHS Nkwenti, Doris, NP   5 mg at 07/25/23 2141   naphazoline-pheniramine (  NAPHCON-A) 0.025-0.3 % ophthalmic solution 1 drop  1 drop Both Eyes TID [name], NP   1 drop at 07/26/23 8657   nicotine polacrilex (NICORETTE) gum 2 mg  2 mg Oral PRN [name], MD   2 mg at 07/25/23 1716   paliperidone (INVEGA) 24 hr tablet 6 mg  6 mg Oral Daily Nkwenti, Tyler Aas, NP   6 mg at 07/25/23 2141   pantoprazole (PROTONIX) EC tablet 40 mg  40 mg Oral Daily Freeda Spivey, Nicole Kindred I, NP   40 mg at 07/26/23 0903   polyethylene glycol (MIRALAX / GLYCOLAX) packet 17 g  17 g Oral Daily PRN [name], MD   17 g at 06/07/23 1847   selenium sulfide (SELSUN) 1 % shampoo   Topical Daily PRN Georgiann Cocker, MD   Given at 07/20/23 2218   sertraline (ZOLOFT) tablet 150 mg  150 mg Oral Daily Massengill, Harrold Donath, MD   150 mg at 07/26/23 8469   SUMAtriptan (IMITREX) tablet 25 mg  25 mg Oral BID PRN [name], NP   25 mg at 07/23/23 1828   traZODone (DESYREL) tablet 50 mg  50 mg Oral QHS [name], NP   50 mg at 07/25/23 2141   Vitamin D (Ergocalciferol) (DRISDOL) 1.25 MG (50000 UNIT) capsule 50,000 Units  50,000 Units Oral Q7 days [name], NP   50,000 Units at 07/20/23 1712   Lab Results:  No results found for this or any previous visit (from the past 48 hours).   Blood Alcohol level:  Lab Results  Component Value Date   ETH <10 12/19/2022   ETH <10  11/21/2019   Metabolic Disorder Labs: Lab Results  Component Value Date   HGBA1C 5.6 06/16/2023   MPG 114.02 06/16/2023   MPG 79.58 12/21/2022   No results found for: "PROLACTIN" Lab Results  Component Value Date   CHOL 256 (H) 06/16/2023   TRIG 224 (H) 06/16/2023   HDL 83 06/16/2023   CHOLHDL 3.1 06/16/2023   VLDL 45 (H) 06/16/2023   LDLCALC 128 (H) 06/16/2023   LDLCALC 149 (H) 12/21/2022   Physical Findings: AIMS: Facial and Oral Movements Muscles of Facial Expression: None Lips and Perioral Area: None Jaw: None Tongue: None,Extremity Movements Upper (arms, wrists, hands, fingers): None Lower (legs, knees, ankles, toes): None, Trunk Movements Neck, shoulders, hips: None, Global Judgements Severity of abnormal movements overall : None Incapacitation due to abnormal movements: None Patient's awareness of abnormal movements: No Awareness, Dental Status Current problems with teeth and/or dentures?: No Does patient usually wear dentures?: No  CIWA:    COWS:     Musculoskeletal: Strength & Muscle Tone: within normal limits Gait & Station: normal Patient leans: N/A  Psychiatric Specialty Exam:  Presentation  General Appearance:  Casually dressed, overweight, not in any distress.  No EPS. Eye Contact: Fair  Speech: Slurred and slow.  Speech Volume: At baseline.  Mood and Affect  Mood: Depressed but not pervasively so. Affect: Blunted and mood congruent.  Thought Process  Thought Processes: Decreased speed of thoughts but goal-directed.  Descriptions of Associations:  Linear.  Orientation:Full (Time, Place and Person)  Thought Content: WNL  Hallucinations: No hallucination in any modality.   Sensorium  Memory: Immediate Good  Judgment: Fair  Insight: Fair  Chartered certified accountant: Fair  Attention Span: Fair  Recall: Jennelle Human of Knowledge: Fair  Language: Fair  Psychomotor Activity  Psychomotor  Activity: Psychomotor Activity: Normal  Physical Exam: Physical Exam HENT:  Nose: Nose normal.     Mouth/Throat:     Pharynx: Oropharynx is clear.  Cardiovascular:     Rate and Rhythm: Normal rate.     Pulses: Normal pulses.  Pulmonary:     Effort: Pulmonary effort is normal.  Genitourinary:    Comments: Deferred Musculoskeletal:     Cervical back: Normal range of motion.  Skin:    General: Skin is warm and dry.  Neurological:     General: No focal deficit present.     Mental Status: She is alert and oriented to person, place, and time.  Psychiatric:        Mood and Affect: Mood normal.        Behavior: Behavior normal.        Thought Content: Thought content normal.        Judgment: Judgment normal.    Review of Systems  Constitutional:  Negative for chills, diaphoresis and fever.  HENT:  Negative for congestion and sore throat.   Respiratory:  Negative for cough, shortness of breath and wheezing.   Cardiovascular:  Negative for chest pain and palpitations.  Gastrointestinal:  Negative for abdominal pain, diarrhea, heartburn, nausea and vomiting.  Musculoskeletal:  Negative for joint pain and myalgias.  Neurological:  Negative for dizziness, tingling, tremors, sensory change, speech change, focal weakness, seizures, loss of consciousness, weakness and headaches.  Psychiatric/Behavioral:  Positive for depression (stable). Negative for hallucinations, memory loss, substance abuse and suicidal ideas. The patient is nervous/anxious (Stable) and has insomnia (Stable).    Blood pressure 118/78, pulse 72, temperature 97.9 F (36.6 C), resp. rate 19, height 4\' 11"  (1.499 m), weight 63 kg, SpO2 100%. Body mass index is 28.05 kg/m.  Treatment Plan Summary: Currently mentally stable for discharge.  Medications: 1.  Continue Invega ER 6 mg at bedtime. 2.  Continue sertraline 150 mg daily. 3.  Continue trazodone 50 mg as needed for insomnia. 4.  Continue Buspar 15 mg po bid  for anxiety. 5.  Continue Hydroxyzine 25 mg po tid prn for anxiety. 6.  Continue Melatonin 5 mg po q bedtime for sleep. 7.  Continue Miralax 17 gm po daily prn for constipation. 8.  Continue Vitamin D1.25 (50,000 units daily for bone health.  6.  Continue NAPHCON-A 0.025-0.3%, 1 gtt to both eyes tid for eye irritation.  8.  Continue Protonix 40 mg po Q am for GERD.  9.  Continue Levothyroxine 25 mcg po Q am for hypothyroidism.  10. Continue Lipitor 10 mg po daily for hyperlipidemia.  CSW is continuing to coordinate discharge planning with DSS. We are continuing Q15 minute checks & encouraging participating in group activities.   Armandina Stammer, NP, pmhnp, fnp-bc 07/26/2023, 12:39 PM Patient ID: Yolanda [name], female   DOB: April 11, 1976, 48 y.o.   Patient ID: COILA YARBROUGH, female   DOB: 10-13-1975, 48 y.o.   MRN: [id number] Patient ID: TORY LINDENMUTH, female   DOB: April 06, 1976, 48 y.o.   MRN: [id number]

## 2023-07-26 NOTE — Progress Notes (Signed)
   07/26/23 0558  15 Minute Checks  Location Bedroom  Visual Appearance Calm  Behavior Sleeping  Sleep (Behavioral Health Patients Only)  Calculate sleep? (Click Yes once per 24 hr at 0600 safety check) Yes  Documented sleep last 24 hours 8.75

## 2023-07-27 NOTE — BHH Group Notes (Signed)
Pt did not attend goals group. 

## 2023-07-27 NOTE — [facility] Group Notes (Signed)
Type of Therapy and Topic:  Group Therapy:  Boundaries  Participation Level:  Did Not Attend   Description of Group:  Patients in this group were introduced to  3 types of healthy boundaries to address the extent to which we allow others into our lives, how we communicate, and where we draw the line when it comes to our personal space, emotions, and needs.  Different types of boundaries were defined and described, and each type was discussed with understanding.  Patients discussed what additional healthy boundaries could be helpful in their recovery and wellness after discharge in order to prevent future hospitalizations.   An emphasis was placed on following up with the discharge plan when they leave the hospital in order to continue becoming healthier and happier.    Therapeutic Goals: 1)  demonstrate and identify the type of unhealthy and healthy boundaries  2)  discuss healthy boundaries and how to address it  3)  identify the patient's current level of healthy boundaries and   4)  elicit communicating healthy boundaries and trust    Summary of Patient Progress:  NA  Therapeutic Modalities:   Psychoeducation Brief Solution-Focused Therapy

## 2023-07-27 NOTE — Progress Notes (Signed)
   07/27/23 2146  Psych Admission Type (Psych Patients Only)  Admission Status Involuntary  Psychosocial Assessment  Patient Complaints Depression  Eye Contact Fair  Facial Expression Animated  Affect Appropriate to circumstance  Speech Soft  Interaction Assertive  Motor Activity Slow  Appearance/Hygiene Disheveled  Behavior Characteristics Appropriate to situation  Mood Depressed;Pleasant  Thought Process  Coherency WDL  Content WDL  Delusions None reported or observed  Perception WDL  Hallucination None reported or observed  Judgment Limited  Confusion None  Danger to Self  Current suicidal ideation? Denies  Self-Injurious Behavior No self-injurious ideation or behavior indicators observed or expressed   Agreement Not to Harm Self Yes  Description of Agreement verbal  Danger to Others  Danger to Others None reported or observed  Danger to Others Abnormal  Harmful Behavior to others No threats or harm toward other people  Destructive Behavior No threats or harm toward property

## 2023-07-27 NOTE — Plan of Care (Addendum)
Problem: Activity: Goal: Interest or engagement in activities will improve Outcome: Progressing   Problem: Coping: Goal: Coping ability will improve Outcome: Progressing   Problem: Medication: Goal: Compliance with prescribed medication regimen will improve Outcome: Progressing   Problem: Self-Concept: Goal: Ability to disclose and discuss suicidal ideas will improve Outcome: Progressing  Pt stayed in bed majority of this morning. Appears depressed with flat affect, elective mutism with avertive eye contact. Requested to speak with Clinical research associate on 2 occasions as Clinical research associate was doing med pass and stated "no, I'm done" when Clinical research associate when into her room to speak with her. Guarded, minimal majority of this shift. Attended some noon groups. Pt remains medication compliant without adverse drug reactions. Tolerates meals and fluids well. Off unit for meals with peers and staff, returned without issues. Safety checks maintained at Q 15 minutes intervals without issues. Emotional support, encouragement and reassurance offered. Pt denies concerns at this time.

## 2023-07-27 NOTE — Progress Notes (Signed)
[facility] MD Progress Note  07/27/2023 2:31 PM Yolanda [name]  MRN:  [id number] HPI:   48 year old African-American female, single, on SSI disability, homeless.  Background history of intellectual disability, substance use disorder and schizoaffective disorder depressive type. Presented via emergency services on account of worsening depression associated with suicidal thoughts. Expressed thoughts of suicide by stabbing herself.  Chart Review from last 24 hours:  The patient's chart and nursing notes were reviewed. The patient's case was discussed in multidisciplinary team meeting. Vital signs are stable today. Patient is compliant with routine medication regimen without difficulty. Patient is sleeping well. No behavioral episodes or nursing concerns were reported at this time.  Patient endorsing thickened sadness about still being hospitalized  Patient assessment: Yolanda [name] is seen this morning and again in the afternoon.  Patient endorses that she does not want to do much today she is feeling sad about being hospitalized.  Patient endorses a wish to be discharged to her neck facility however she is understanding that the hospital is working to find a place for her.  Patient denies SI, HI and AVH.  Patient reports she did not sleep well last night.  Patient endorses she will probably go to groups and activities tomorrow but would like to spend time in bed today.  Labs Reviewed: FT3 & FT4 were slightly subtherapeutic 2 weeks ago at 1.9 & 0.56 respectively. Repeated and FT3 is WNL & FT4 is still slightly low at 0.56. Patient has a history of hypothyroidism & is being treated with Levothyroxine.  Principal Problem: Schizoaffective disorder, depressive type (HCC)  Diagnosis: Principal Problem:   Schizoaffective disorder, depressive type (HCC) Active Problems:   GERD (gastroesophageal reflux disease)   Intellectual disability   Tobacco use disorder  Total Time spent with patient: 20 minutes  Past  Psychiatric History: Extensive history of mental illness.  She has been tried on multiple medications over the years.  See H&P for full details.  Past Medical History:  Past Medical History:  Diagnosis Date   Bipolar affect, depressed (HCC)    Constipation 08/17/2022   Depression    Falls 07/21/2022   Fracture of femoral neck, right, closed (HCC) 01/17/2022   Herpes simplex 08/22/2017   Open fracture dislocation of right elbow joint 01/17/2022    Past Surgical History:  Procedure Laterality Date   NO PAST SURGERIES     SALPINGECTOMY      Family History: History reviewed. No pertinent family history.  Family Psychiatric  History: Extensive family history of mood disorder.  Social History:  Social History   Substance and Sexual Activity  Alcohol Use Yes     Social History   Substance and Sexual Activity  Drug Use Yes   Types: Cocaine, Marijuana    Social History   Socioeconomic History   Marital status: Single    Spouse name: Not on file   Number of children: Not on file   Years of education: Not on file   Highest education level: Not on file  Occupational History   Not on file  Tobacco Use   Smoking status: Every Day   Smokeless tobacco: Not on file  Substance and Sexual Activity   Alcohol use: Yes   Drug use: Yes    Types: Cocaine, Marijuana   Sexual activity: Yes  Other Topics Concern   Not on file  Social History Narrative   Not on file   Social Drivers of Health   Financial Resource Strain: Low Risk  (09/12/2022)  Received from [facility], [facility]   Overall Financial Resource Strain (CARDIA)    Difficulty of Paying Living Expenses: Not hard at all  Food Insecurity: Patient Declined (12/20/2022)   Hunger Vital Sign    Worried About Running Out of Food in the Last Year: Patient declined    Ran Out of Food in the Last Year: Patient declined  Transportation Needs: No Transportation Needs (12/20/2022)   PRAPARE - Scientist, research (physical sciences) (Medical): No    Lack of Transportation (Non-Medical): No  Physical Activity: Not on file  Stress: No Stress Concern Present (07/17/2022)   Received from [facility], [facility] - Occupational Stress Questionnaire    Feeling of Stress : Not at all  Social Connections: Unknown (07/16/2022)   Received from [facility], [facility]   Social Network    Social Network: Not on file   Current Medications: Current Facility-Administered Medications  Medication Dose Route Frequency Provider Last Rate Last Admin   acetaminophen (TYLENOL) tablet 650 mg  650 mg Oral Q6H PRN [name], NP   650 mg at 07/25/23 2013   alum & mag hydroxide-simeth (MAALOX/MYLANTA) 200-200-20 MG/5ML suspension 30 mL  30 mL Oral Q4H PRN Onuoha, Chinwendu V, NP   30 mL at 05/31/23 0801   atorvastatin (LIPITOR) tablet 10 mg  10 mg Oral Daily [name], NP   10 mg at 07/26/23 1652   busPIRone (BUSPAR) tablet 15 mg  15 mg Oral BID [name], NP   15 mg at 07/27/23 1008   cyanocobalamin (VITAMIN B12) injection 1,000 mcg  1,000 mcg Intramuscular Q30 days Sarita Bottom, MD   1,000 mcg at 07/03/23 1009   diphenhydrAMINE (BENADRYL) capsule 50 mg  50 mg Oral TID PRN [name], NP   50 mg at 06/03/23 1110   Or   diphenhydrAMINE (BENADRYL) injection 50 mg  50 mg Intramuscular TID PRN [name], NP       docusate sodium (COLACE) capsule 100 mg  100 mg Oral Daily Nkwenti, Doris, NP   100 mg at 07/27/23 1007   haloperidol (HALDOL) tablet 5 mg  5 mg Oral TID PRN [name], NP   5 mg at 06/03/23 1110   Or   haloperidol lactate (HALDOL) injection 5 mg  5 mg Intramuscular TID PRN [name], NP       hydrocortisone cream 1 %   Topical BID Cecilie Lowers, FNP   Given at 07/27/23 1008   hydrOXYzine (ATARAX) tablet 25 mg  25 mg Oral TID PRN [name], DO   25 mg at 07/26/23 1828   levothyroxine (SYNTHROID) tablet 25 mcg  25 mcg Oral Q0600 Golda Acre, MD   25 mcg at 07/27/23 1610   LORazepam (ATIVAN) tablet 2 mg  2 mg Oral TID PRN [name], NP   2 mg at 06/03/23 1110   Or   LORazepam (ATIVAN) injection 2 mg  2 mg Intramuscular TID PRN [name], NP       magnesium hydroxide (MILK OF MAGNESIA) suspension 30 mL  30 mL Oral Daily PRN [name], NP   30 mL at 07/18/23 1701   melatonin tablet 5 mg  5 mg Oral QHS Nkwenti, Doris, NP   5 mg at 07/26/23 2118   nicotine polacrilex (NICORETTE) gum 2 mg  2 mg Oral PRN [name], MD   2 mg at 07/25/23 1716  paliperidone (INVEGA) 24 hr tablet 6 mg  6 mg Oral Daily Nkwenti, Doris, NP   6 mg at 07/26/23 2118   pantoprazole (PROTONIX) EC tablet 40 mg  40 mg Oral Daily Nwoko, Nicole Kindred I, NP   40 mg at 07/27/23 1007   polyethylene glycol (MIRALAX / GLYCOLAX) packet 17 g  17 g Oral Daily PRN [name], MD   17 g at 06/07/23 1847   selenium sulfide (SELSUN) 1 % shampoo   Topical Daily PRN Georgiann Cocker, MD   Given at 07/20/23 2218   sertraline (ZOLOFT) tablet 150 mg  150 mg Oral Daily Massengill, Harrold Donath, MD   150 mg at 07/27/23 1007   SUMAtriptan (IMITREX) tablet 25 mg  25 mg Oral BID PRN [name], NP   25 mg at 07/23/23 1828   traZODone (DESYREL) tablet 50 mg  50 mg Oral QHS [name], NP   50 mg at 07/26/23 2118   Vitamin D (Ergocalciferol) (DRISDOL) 1.25 MG (50000 UNIT) capsule 50,000 Units  50,000 Units Oral Q7 days [name], NP   50,000 Units at 07/20/23 1712   Lab Results:  No results found for this or any previous visit (from the past 48 hours).   Blood Alcohol level:  Lab Results  Component Value Date   ETH <10 12/19/2022   ETH <10 11/21/2019   Metabolic Disorder Labs: Lab Results  Component Value Date   HGBA1C 5.6 06/16/2023   MPG 114.02 06/16/2023   MPG 79.58 12/21/2022   No results found for: "PROLACTIN" Lab Results  Component Value Date   CHOL 256 (H) 06/16/2023   TRIG 224 (H) 06/16/2023   HDL 83 06/16/2023   CHOLHDL 3.1  06/16/2023   VLDL 45 (H) 06/16/2023   LDLCALC 128 (H) 06/16/2023   LDLCALC 149 (H) 12/21/2022   Physical Findings: AIMS: Facial and Oral Movements Muscles of Facial Expression: None Lips and Perioral Area: None Jaw: None Tongue: None,Extremity Movements Upper (arms, wrists, hands, fingers): None Lower (legs, knees, ankles, toes): None, Trunk Movements Neck, shoulders, hips: None, Global Judgements Severity of abnormal movements overall : None Incapacitation due to abnormal movements: None Patient's awareness of abnormal movements: No Awareness, Dental Status Current problems with teeth and/or dentures?: No Does patient usually wear dentures?: No  CIWA:    COWS:     Musculoskeletal: Strength & Muscle Tone: within normal limits Gait & Station: normal Patient leans: N/A  Psychiatric Specialty Exam:  Presentation  General Appearance:  Casually dressed, overweight, not in any distress.  No EPS. Eye Contact: Minimal  Speech: Slurred and slow.  Speech Volume: At baseline.  Mood and Affect  Mood: Depressed but not pervasively so. Affect: Blunted and mood congruent.  Thought Process  Thought Processes: Decreased speed of thoughts but goal-directed.  Descriptions of Associations:  Linear.  Orientation:Full (Time, Place and Person)  Thought Content: WNL  Hallucinations: No hallucination in any modality.   Sensorium  Memory: Immediate Good; Recent Good  Judgment: Fair  Insight: Fair  Chartered certified accountant: Fair  Attention Span: Fair  Recall: [employer] of Knowledge: Fair  Language: Fair  Psychomotor Activity  Psychomotor Activity: Psychomotor Activity: Normal  Physical Exam: Physical Exam HENT:     Nose: Nose normal.     Mouth/Throat:     Pharynx: Oropharynx is clear.  Cardiovascular:     Rate and Rhythm: Normal rate.     Pulses: Normal pulses.  Pulmonary:     Effort: Pulmonary effort is normal.  Genitourinary:     Comments: Deferred Musculoskeletal:     Cervical back: Normal range of motion.  Skin:    General: Skin is warm and dry.  Neurological:     General: No focal deficit present.     Mental Status: She is alert and oriented to person, place, and time.  Psychiatric:        Mood and Affect: Mood normal.        Behavior: Behavior normal.        Thought Content: Thought content normal.        Judgment: Judgment normal.    Review of Systems  Constitutional:  Negative for chills, diaphoresis and fever.  HENT:  Negative for congestion and sore throat.   Respiratory:  Negative for cough, shortness of breath and wheezing.   Cardiovascular:  Negative for chest pain and palpitations.  Gastrointestinal:  Negative for abdominal pain, diarrhea, heartburn, nausea and vomiting.  Musculoskeletal:  Negative for joint pain and myalgias.  Neurological:  Negative for dizziness, tingling, tremors, sensory change, speech change, focal weakness, seizures, loss of consciousness, weakness and headaches.  Psychiatric/Behavioral:  Positive for depression (stable). Negative for hallucinations, memory loss, substance abuse and suicidal ideas. The patient is nervous/anxious (Stable) and has insomnia (Stable).    Blood pressure 99/73, pulse 91, temperature 97.7 F (36.5 C), temperature source Oral, resp. rate 19, height 4\' 11"  (1.499 m), weight 63 kg, SpO2 98%. Body mass index is 28.05 kg/m.  Treatment Plan Summary: Currently mentally stable for discharge.  Medications: 1.  Continue Invega ER 6 mg at bedtime. 2.  Continue sertraline 150 mg daily. 3.  Continue trazodone 50 mg as needed for insomnia. 4.  Continue Buspar 15 mg po bid for anxiety. 5.  Continue Hydroxyzine 25 mg po tid prn for anxiety. 6.  Continue Melatonin 5 mg po q bedtime for sleep. 7.  Continue Miralax 17 gm po daily prn for constipation. 8.  Continue Vitamin D1.25 (50,000 units daily for bone health.  6.  Continue NAPHCON-A 0.025-0.3%, 1 gtt to  both eyes tid for eye irritation.  8.  Continue Protonix 40 mg po Q am for GERD.  9.  Continue Levothyroxine 25 mcg po Q am for hypothyroidism.  10. Continue Lipitor 10 mg po daily for hyperlipidemia.  CSW is continuing to coordinate discharge planning with DSS. We are continuing Q15 minute checks & encouraging participating in group activities.   Bobbye Morton, MD 07/27/2023, 2:31 PM Patient ID: Yolanda [name], female   DOB: 1975-10-07, 48 y.o.   Patient ID: Yolanda [name], female   DOB: September 13, 1975, 47 y.o.   MRN: [id number] Patient ID: Yolanda [name], female   DOB: [birth year], 48 y.o.   MRN: [id number]

## 2023-07-27 NOTE — Progress Notes (Signed)
   07/27/23 0557  15 Minute Checks  Location Bedroom  Visual Appearance Calm  Behavior Sleeping  Sleep (Behavioral Health Patients Only)  Calculate sleep? (Click Yes once per 24 hr at 0600 safety check) Yes  Documented sleep last 24 hours 8.5

## 2023-07-27 NOTE — [facility] Group Notes (Signed)
[facility] Group Notes:  (Nursing)  Date:  07/27/2023  Time:  1400  Type of Therapy:  Psychoeducational Skills  Participation Level:  Did Not Attend   Shela Nevin 07/27/2023, 3:20 PM

## 2023-07-28 DIAGNOSIS — F251 Schizoaffective disorder, depressive type: Secondary | ICD-10-CM | POA: Diagnosis not present

## 2023-07-28 NOTE — Plan of Care (Signed)
  Problem: Coping: Goal: Coping ability will improve Outcome: Progressing   Problem: Medication: Goal: Compliance with prescribed medication regimen will improve Outcome: Progressing   

## 2023-07-28 NOTE — Progress Notes (Signed)
[facility] MD Progress Note  07/28/2023 10:45 AM Yolanda [name]  MRN:  [id number] Subjective:   48 year old African-American female, single, on SSI disability, homeless.  Background history of intellectual disability, substance use disorder and schizoaffective disorder depressive type.  Presented via emergency services on account of worsening depression associated with suicidal thoughts.  Expressed thoughts of suicide by stabbing herself.  Chart reviewed today.  Patient discussed at multidisciplinary team meeting.  Staff reports that patient is at baseline.  No challenging behavior.  She is adherent with her medication.  She slept well last night.  Seen today.  No new issues.  Looks forward to discharge.  No evidence of mania.  No evidence of psychosis.  Not pervasively down.  No self-injurious thoughts.  No rageful thoughts towards others or property. Encouraged.  Principal Problem: Schizoaffective disorder, depressive type (HCC) Diagnosis: Principal Problem:   Schizoaffective disorder, depressive type (HCC) Active Problems:   GERD (gastroesophageal reflux disease)   Intellectual disability   Tobacco use disorder  Total Time spent with patient: 20 minutes  Past Psychiatric History: Extensive history of mental illness.  She has been tried on multiple medications over the years.  See H&P for full details.  Past Medical History:  Past Medical History:  Diagnosis Date   Bipolar affect, depressed (HCC)    Constipation 08/17/2022   Depression    Falls 07/21/2022   Fracture of femoral neck, right, closed (HCC) 01/17/2022   Herpes simplex 08/22/2017   Open fracture dislocation of right elbow joint 01/17/2022    Past Surgical History:  Procedure Laterality Date   NO PAST SURGERIES     SALPINGECTOMY     Family History: History reviewed. No pertinent family history. Family Psychiatric  History: Extensive family history of mood disorder. Social History:  Social History   Substance and  Sexual Activity  Alcohol Use Yes     Social History   Substance and Sexual Activity  Drug Use Yes   Types: Cocaine, Marijuana    Social History   Socioeconomic History   Marital status: Single    Spouse name: Not on file   Number of children: Not on file   Years of education: Not on file   Highest education level: Not on file  Occupational History   Not on file  Tobacco Use   Smoking status: Every Day   Smokeless tobacco: Not on file  Substance and Sexual Activity   Alcohol use: Yes   Drug use: Yes    Types: Cocaine, Marijuana   Sexual activity: Yes  Other Topics Concern   Not on file  Social History Narrative   Not on file   Social Drivers of Health   Financial Resource Strain: Low Risk  (09/12/2022)   Received from [facility], [facility]   Overall Financial Resource Strain (CARDIA)    Difficulty of Paying Living Expenses: Not hard at all  Food Insecurity: Patient Declined (12/20/2022)   Hunger Vital Sign    Worried About Running Out of Food in the Last Year: Patient declined    Ran Out of Food in the Last Year: Patient declined  Transportation Needs: No Transportation Needs (12/20/2022)   PRAPARE - Administrator, Civil Service (Medical): No    Lack of Transportation (Non-Medical): No  Physical Activity: Not on file  Stress: No Stress Concern Present (07/17/2022)   Received from [facility], [facility] of Occupational Health - Occupational Stress Questionnaire    Feeling of  Stress : Not at all  Social Connections: Unknown (07/16/2022)   Received from [facility], [facility]   Social Network    Social Network: Not on file     Current Medications: Current Facility-Administered Medications  Medication Dose Route Frequency Provider Last Rate Last Admin   acetaminophen (TYLENOL) tablet 650 mg  650 mg Oral Q6H PRN [name], NP   650 mg at 07/25/23 2013   alum & mag hydroxide-simeth (MAALOX/MYLANTA) 200-200-20 MG/5ML  suspension 30 mL  30 mL Oral Q4H PRN Onuoha, Chinwendu V, NP   30 mL at 05/31/23 0801   atorvastatin (LIPITOR) tablet 10 mg  10 mg Oral Daily [name], NP   10 mg at 07/27/23 1835   busPIRone (BUSPAR) tablet 15 mg  15 mg Oral BID [name], NP   15 mg at 07/28/23 0900   cyanocobalamin (VITAMIN B12) injection 1,000 mcg  1,000 mcg Intramuscular Q30 days Sarita Bottom, MD   1,000 mcg at 07/03/23 1009   diphenhydrAMINE (BENADRYL) capsule 50 mg  50 mg Oral TID PRN [name], NP   50 mg at 06/03/23 1110   Or   diphenhydrAMINE (BENADRYL) injection 50 mg  50 mg Intramuscular TID PRN [name], NP       docusate sodium (COLACE) capsule 100 mg  100 mg Oral Daily Nkwenti, Doris, NP   100 mg at 07/28/23 0900   haloperidol (HALDOL) tablet 5 mg  5 mg Oral TID PRN [name], NP   5 mg at 06/03/23 1110   Or   haloperidol lactate (HALDOL) injection 5 mg  5 mg Intramuscular TID PRN [name], NP       hydrocortisone cream 1 %   Topical BID Cecilie Lowers, FNP   Given at 07/27/23 1008   hydrOXYzine (ATARAX) tablet 25 mg  25 mg Oral TID PRN [name], DO   25 mg at 07/26/23 1828   levothyroxine (SYNTHROID) tablet 25 mcg  25 mcg Oral Q0600 Golda Acre, MD   25 mcg at 07/28/23 0900   LORazepam (ATIVAN) tablet 2 mg  2 mg Oral TID PRN [name], NP   2 mg at 06/03/23 1110   Or   LORazepam (ATIVAN) injection 2 mg  2 mg Intramuscular TID PRN [name], NP       magnesium hydroxide (MILK OF MAGNESIA) suspension 30 mL  30 mL Oral Daily PRN [name], NP   30 mL at 07/18/23 1701   melatonin tablet 5 mg  5 mg Oral QHS Nkwenti, Doris, NP   5 mg at 07/27/23 2106   nicotine polacrilex (NICORETTE) gum 2 mg  2 mg Oral PRN [name], MD   2 mg at 07/27/23 1437   paliperidone (INVEGA) 24 hr tablet 6 mg  6 mg Oral Daily Nkwenti, Tyler Aas, NP   6 mg at 07/27/23 2106   pantoprazole (PROTONIX) EC tablet 40 mg  40 mg Oral Daily Nwoko, Nicole Kindred I, NP   40 mg at 07/28/23 0900    polyethylene glycol (MIRALAX / GLYCOLAX) packet 17 g  17 g Oral Daily PRN [name], MD   17 g at 06/07/23 1847   selenium sulfide (SELSUN) 1 % shampoo   Topical Daily PRN Georgiann Cocker, MD   Given at 07/20/23 2218   sertraline (ZOLOFT) tablet 150 mg  150 mg Oral Daily Massengill, Harrold Donath, MD   150 mg at 07/28/23 0900   SUMAtriptan (IMITREX) tablet 25 mg  25 mg Oral BID PRN [name], NP   25 mg at 07/23/23 1828   traZODone (DESYREL) tablet 50 mg  50 mg Oral QHS [name], NP   50 mg at 07/27/23 2106   Vitamin D (Ergocalciferol) (DRISDOL) 1.25 MG (50000 UNIT) capsule 50,000 Units  50,000 Units Oral Q7 days [name], NP   50,000 Units at 07/27/23 1434    Lab Results: No results found for this or any previous visit (from the past 48 hours).  Blood Alcohol level:  Lab Results  Component Value Date   ETH <10 12/19/2022   ETH <10 11/21/2019    Metabolic Disorder Labs: Lab Results  Component Value Date   HGBA1C 5.6 06/16/2023   MPG 114.02 06/16/2023   MPG 79.58 12/21/2022   No results found for: "PROLACTIN" Lab Results  Component Value Date   CHOL 256 (H) 06/16/2023   TRIG 224 (H) 06/16/2023   HDL 83 06/16/2023   CHOLHDL 3.1 06/16/2023   VLDL 45 (H) 06/16/2023   LDLCALC 128 (H) 06/16/2023   LDLCALC 149 (H) 12/21/2022    Physical Findings: AIMS: Facial and Oral Movements Muscles of Facial Expression: None Lips and Perioral Area: None Jaw: None Tongue: None,Extremity Movements Upper (arms, wrists, hands, fingers): None Lower (legs, knees, ankles, toes): None, Trunk Movements Neck, shoulders, hips: None, Global Judgements Severity of abnormal movements overall : None Incapacitation due to abnormal movements: None Patient's awareness of abnormal movements: No Awareness, Dental Status Current problems with teeth and/or dentures?: No Does patient usually wear dentures?: No  CIWA:    COWS:     Musculoskeletal: Strength & Muscle Tone: within normal  limits Gait & Station: normal Patient leans: N/A  Psychiatric Specialty Exam:  Presentation  General Appearance:  Casually dressed, overweight, not in any distress.  No EPS. Eye Contact: Minimal  Speech: Slurred and slow.  Speech Volume: At baseline.  Mood and Affect  Mood: Depressed but not pervasively so. Affect: Blunted and mood congruent.  Thought Process  Thought Processes: Decreased speed of thoughts but goal-directed.  Descriptions of Associations:  Linear.  Orientation:Full (Time, Place and Person)  Thought Content: No negative ruminations today.  No guilty ruminations.  No active suicidal thoughts.  No homicidal thoughts.  No thoughts of violence.  No delusional preoccupation.   Hallucinations: No hallucination in any modality.   Sensorium  Memory: Immediate Good; Recent Good  Judgment: Fair  Insight: Fair   Chartered certified accountant: Fair  Attention Span: Fair  Recall: Fair  Fund of Knowledge: Fair  Language: Fair   Psychomotor Activity  Psychomotor Activity: Psychomotor Activity: Normal      Physical Exam: Physical Exam ROS Blood pressure 108/77, pulse 72, temperature 97.7 F (36.5 C), temperature source Oral, resp. rate 19, height 4\' 11"  (1.499 m), weight 63 kg, SpO2 98%. Body mass index is 28.05 kg/m.   Treatment Plan Summary: Patient is at her baseline.  She is tolerating her medications well.  There is no dangerousness.  She is stable for care as soon as we finalize safe an appropriate disposition.  We will continue her medications and evaluate her further.  1.  Continue Invega ER 6 mg at bedtime. 2.  Continue sertraline 150 mg daily. 3.  Continue trazodone 50 mg as needed for insomnia. 4.  Continue buspirone 15 mg BID 5.  Continue to encourage unit groups and therapeutic activities. 6.  Social worker will coordinate discharge and aftercare planning.   Georgiann Cocker, MD 07/28/2023, 10:45  AM

## 2023-07-28 NOTE — Progress Notes (Signed)
   07/28/23 0530  15 Minute Checks  Location Bedroom  Visual Appearance Calm  Behavior Sleeping  Sleep (Behavioral Health Patients Only)  Calculate sleep? (Click Yes once per 24 hr at 0600 safety check) Yes  Documented sleep last 24 hours 8.25

## 2023-07-28 NOTE — Group Note (Signed)
Date:  07/28/2023 Time:  1:32 AM  Group Topic/Focus:  Wrap-Up Group:   The focus of this group is to help patients review their daily goal of treatment and discuss progress on daily workbooks.    Participation Level:  Did Not Attend  Participation Quality:   N/A  Affect:   N/A  Cognitive:   N/A  Insight: None  Engagement in Group:   N/A  Modes of Intervention:   N/A  Additional Comments:  Patient did not attend wrap up group.   Kennieth Francois 07/28/2023, 1:32 AM

## 2023-07-28 NOTE — Progress Notes (Signed)
   07/28/23 0900  Psych Admission Type (Psych Patients Only)  Admission Status Involuntary  Psychosocial Assessment  Patient Complaints Depression  Eye Contact Fair  Facial Expression Sad  Affect Appropriate to circumstance;Depressed  Speech Soft  Interaction Assertive  Motor Activity Slow  Appearance/Hygiene Disheveled  Behavior Characteristics Cooperative;Appropriate to situation  Mood Depressed  Aggressive Behavior  Effect No apparent injury  Thought Process  Coherency WDL  Content WDL  Delusions None reported or observed  Perception WDL  Hallucination None reported or observed  Judgment Limited  Confusion None  Danger to Self  Current suicidal ideation? Denies  Agreement Not to Harm Self Yes  Description of Agreement agreed to contact staff before acting on harmful thoughts  Danger to Others  Danger to Others None reported or observed  Danger to Others Abnormal  Harmful Behavior to others No threats or harm toward other people  Destructive Behavior No threats or harm toward property

## 2023-07-28 NOTE — [facility] Group Notes (Signed)
Psychoeducational Group Note  Date:  07/28/2023 Time:  2000  Group Topic/Focus:  Wrap up group  Participation Level: Did Not Attend  Participation Quality:  Not Applicable  Affect:  Not Applicable  Cognitive:  Not Applicable  Insight:  Not Applicable  Engagement in Group: Not Applicable  Additional Comments:  Did not attend.   [name] S 07/28/2023, 10:08 PM

## 2023-07-28 NOTE — Progress Notes (Signed)
   07/28/23 2116  Psych Admission Type (Psych Patients Only)  Admission Status Involuntary  Psychosocial Assessment  Patient Complaints Depression  Eye Contact Fair  Facial Expression Animated  Affect Appropriate to circumstance  Speech Logical/coherent  Interaction Assertive  Motor Activity Slow  Appearance/Hygiene Disheveled  Behavior Characteristics Appropriate to situation  Mood Pleasant;Depressed  Thought Process  Coherency WDL  Content WDL  Delusions None reported or observed  Perception WDL  Hallucination None reported or observed  Judgment Limited  Confusion None  Danger to Self  Current suicidal ideation? Denies  Self-Injurious Behavior No self-injurious ideation or behavior indicators observed or expressed   Agreement Not to Harm Self Yes  Description of Agreement verbal  Danger to Others  Danger to Others None reported or observed  Danger to Others Abnormal  Harmful Behavior to others No threats or harm toward other people  Destructive Behavior No threats or harm toward property

## 2023-07-29 DIAGNOSIS — F251 Schizoaffective disorder, depressive type: Secondary | ICD-10-CM | POA: Diagnosis not present

## 2023-07-29 MED ORDER — LEVOTHYROXINE SODIUM 50 MCG PO TABS
50.0000 ug | ORAL_TABLET | Freq: Every day | ORAL | Status: DC
  Administered 2023-07-30 – 2023-08-02 (×4): 50 ug via ORAL
  Filled 2023-07-29 (×5): qty 1

## 2023-07-29 NOTE — Group Note (Signed)
Occupational Therapy Group Note  Group Topic: Sleep Hygiene  Group Date: 07/29/2023 Start Time: 1430 End Time: 1500 Facilitators: Ted Mcalpine, OT   Group Description: Group encouraged increased participation and engagement through topic focused on sleep hygiene. Patients reflected on the quality of sleep they typically receive and identified areas that need improvement. Group was given background information on sleep and sleep hygiene, including common sleep disorders. Group members also received information on how to improve one's sleep and introduced a sleep diary as a tool that can be utilized to track sleep quality over a length of time. Group session ended with patients identifying one or more strategies they could utilize or implement into their sleep routine in order to improve overall sleep quality.        Therapeutic Goal(s):  Identify one or more strategies to improve overall sleep hygiene  Identify one or more areas of sleep that are negatively impacted (sleep too much, too little, etc)     Participation Level: Minimal   Participation Quality: Independent   Behavior: Appropriate   Speech/Thought Process: Barely audible   Affect/Mood: Appropriate   Insight: Limited   Judgement: Limited      Modes of Intervention: Education  Patient Response to Interventions:  Attentive   Plan: Continue to engage patient in OT groups 2 - 3x/week.  07/29/2023  Ted Mcalpine, OT   Kerrin Champagne, OT

## 2023-07-29 NOTE — Progress Notes (Signed)
   07/29/23 0541  15 Minute Checks  Location Bedroom  Visual Appearance Calm  Behavior Sleeping  Sleep (Behavioral Health Patients Only)  Calculate sleep? (Click Yes once per 24 hr at 0600 safety check) Yes  Documented sleep last 24 hours 9

## 2023-07-29 NOTE — Plan of Care (Signed)
  Problem: Health Behavior/Discharge Planning: Goal: Identification of resources available to assist in meeting health care needs will improve Outcome: Progressing   Problem: Medication: Goal: Compliance with prescribed medication regimen will improve Outcome: Progressing

## 2023-07-29 NOTE — Progress Notes (Signed)
CSW spoke with ACTT services, [facility] who emailed a copy of pt's Treatment plan for outpatient services. CSW shared with pt's mother, Yolanda [name], once plan has been received a meeting will be scheduled to discuss pt's discharge home. CSW also shared with mother that  placement will continued to be sought following discharge. CSW will ask DSS of [facility], [place], [facility], ACTT services, pt/pt's family and [facility] staff to be present for mtg. CSW will update Treatment team once mtg has been scheduled.  CSW will continue to follow.

## 2023-07-29 NOTE — Plan of Care (Signed)
Nurse discussed anxiety, depression with patient, coping skills.  Explained to patient that SW/MD/staff are discussing, planning placement after discharge from [facility].

## 2023-07-29 NOTE — [facility] Group Notes (Signed)
The focus of this group is to help patients establish daily goals to achieve during treatment and discuss how the patient can incorporate goal setting into their daily lives to aide in recovery.      Scale 1-10  7 out of 10     Goal: Get some sleep and make some more groups

## 2023-07-29 NOTE — Group Note (Signed)
Recreation Therapy Group Note   Group Topic:Communication  Group Date: 07/29/2023 Start Time: 0933 End Time: 0955 Facilitators: Madix Blowe-McCall, LRT,CTRS Location: [address] Dayroom   Group Topic: Communication, Problem Solving   Goal Area(s) Addresses:  Patient will effectively listen to complete activity.  Patient will identify communication skills used to make activity successful.  Patient will identify how skills used during activity can be used to reach post d/c goals.    Intervention: Building surveyor Activity - Geometric pattern cards, pencils, blank paper    Activity: Geometric Drawings.  Three volunteers from the peer group will be shown an abstract picture with a particular arrangement of geometrical shapes.  Each round, one 'speaker' will describe the pattern, as accurately as possible without revealing the image to the group.  The remaining group members will listen and draw the picture to reflect how it is described to them. Patients with the role of 'listener' cannot ask clarifying questions but, may request that the speaker repeat a direction. Once the drawings are complete, the presenter will show the rest of the group the picture and compare how close each person came to drawing the picture. LRT will facilitate a post-activity discussion regarding effective communication and the importance of planning, listening, and asking for clarification in daily interactions with others.  Education: Environmental consultant, Active listening, Support systems, Discharge planning  Education Outcome: Acknowledges understanding/In group clarification offered/Needs additional education.    Affect/Mood: Appropriate   Participation Level: Engaged   Participation Quality: Independent   Behavior: Appropriate and Attentive    Speech/Thought Process: Focused   Insight: Good   Judgement: Good   Modes of Intervention: Activity   Patient Response to Interventions:  Engaged    Education Outcome:  In group clarification offered    Clinical Observations/Individualized Feedback: Pt was the first presenter. Pt needed some assistance from LRT in how to describe the drawing to peers. Pt did better than she expected. Pt also struggled to draw the pictures described by peers. Pt didn't give up but continued. Pt did well in getting close to the designs described in group. Pt also stated this activity showed "you have to listen".     Plan: Continue to engage patient in RT group sessions 2-3x/week.   Safiya Girdler-McCall, LRT,CTRS  07/29/2023 12:07 PM

## 2023-07-29 NOTE — Progress Notes (Signed)
[facility] MD Progress Note  07/29/2023 4:46 PM Yolanda [name]  MRN:  [id number]  Subjective:   48 year old African-American female, single, on SSI disability, homeless.  Background history of intellectual disability, substance use disorder and schizoaffective disorder depressive type.  Presented via emergency services on account of worsening depression associated with suicidal thoughts.  Expressed thoughts of suicide by stabbing herself.  Chart reviewed today.  Patient discussed at multidisciplinary team meeting.  Staff reports that she slept well last night.  No changes in her mental state.  No PRNs required lately.  Zelda was seen in her room during rounds.  She reported that her mood was "good" today.  She denies any new psychiatric or medical complaints.  She reports that her appetite is good.  She reports that focus and concentration are adequate.  She denies issues with energy.  She reports adequate sleep.  She denies any medication side effects.  She denies suicidal ideations, homicidal ideations, auditory hallucinations, visual hallucinations, or delusions.  We discussed increasing her synthroid to better manage hypothyroidism.  The patient is not able to live independently due to intellectual disability, and is pending placement.   Principal Problem: Schizoaffective disorder, depressive type (HCC) Diagnosis: Principal Problem:   Schizoaffective disorder, depressive type (HCC) Active Problems:   GERD (gastroesophageal reflux disease)   Intellectual disability   Tobacco use disorder  Total Time spent with patient: 20 minutes  Past Psychiatric History: Extensive history of mental illness.  She has been tried on multiple medications over the years.  See H&P for full details.  Past Medical History:  Past Medical History:  Diagnosis Date   Bipolar affect, depressed (HCC)    Constipation 08/17/2022   Depression    Falls 07/21/2022   Fracture of femoral neck, right, closed (HCC)  01/17/2022   Herpes simplex 08/22/2017   Open fracture dislocation of right elbow joint 01/17/2022    Past Surgical History:  Procedure Laterality Date   NO PAST SURGERIES     SALPINGECTOMY     Family History: History reviewed. No pertinent family history. Family Psychiatric  History: Extensive family history of mood disorder. Social History:  Social History   Substance and Sexual Activity  Alcohol Use Yes     Social History   Substance and Sexual Activity  Drug Use Yes   Types: Cocaine, Marijuana    Social History   Socioeconomic History   Marital status: Single    Spouse name: Not on file   Number of children: Not on file   Years of education: Not on file   Highest education level: Not on file  Occupational History   Not on file  Tobacco Use   Smoking status: Every Day   Smokeless tobacco: Not on file  Substance and Sexual Activity   Alcohol use: Yes   Drug use: Yes    Types: Cocaine, Marijuana   Sexual activity: Yes  Other Topics Concern   Not on file  Social History Narrative   Not on file   Social Drivers of Health   Financial Resource Strain: Low Risk  (09/12/2022)   Received from [facility], [facility]   Overall Financial Resource Strain (CARDIA)    Difficulty of Paying Living Expenses: Not hard at all  Food Insecurity: Patient Declined (12/20/2022)   Hunger Vital Sign    Worried About Running Out of Food in the Last Year: Patient declined    Ran Out of Food in the Last Year: Patient declined  Transportation Needs: No  Transportation Needs (12/20/2022)   PRAPARE - Administrator, Civil Service (Medical): No    Lack of Transportation (Non-Medical): No  Physical Activity: Not on file  Stress: No Stress Concern Present (07/17/2022)   Received from [facility] of Occupational Health - Occupational Stress Questionnaire    Feeling of Stress : Not at all  Social Connections: Unknown (07/16/2022)   Received  from [facility], [facility]   Social Network    Social Network: Not on file     Current Medications: Current Facility-Administered Medications  Medication Dose Route Frequency Provider Last Rate Last Admin   acetaminophen (TYLENOL) tablet 650 mg  650 mg Oral Q6H PRN [name], NP   650 mg at 07/29/23 0909   alum & mag hydroxide-simeth (MAALOX/MYLANTA) 200-200-20 MG/5ML suspension 30 mL  30 mL Oral Q4H PRN Onuoha, Chinwendu V, NP   30 mL at 05/31/23 0801   atorvastatin (LIPITOR) tablet 10 mg  10 mg Oral Daily [name], NP   10 mg at 07/28/23 1812   busPIRone (BUSPAR) tablet 15 mg  15 mg Oral BID [name], NP   15 mg at 07/29/23 1635   cyanocobalamin (VITAMIN B12) injection 1,000 mcg  1,000 mcg Intramuscular Q30 days Sarita Bottom, MD   1,000 mcg at 07/03/23 1009   diphenhydrAMINE (BENADRYL) capsule 50 mg  50 mg Oral TID PRN [name], NP   50 mg at 06/03/23 1110   Or   diphenhydrAMINE (BENADRYL) injection 50 mg  50 mg Intramuscular TID PRN [name], NP       docusate sodium (COLACE) capsule 100 mg  100 mg Oral Daily Nkwenti, Tyler Aas, NP   100 mg at 07/29/23 1610   haloperidol (HALDOL) tablet 5 mg  5 mg Oral TID PRN [name], NP   5 mg at 06/03/23 1110   Or   haloperidol lactate (HALDOL) injection 5 mg  5 mg Intramuscular TID PRN [name], NP       hydrocortisone cream 1 %   Topical BID Cecilie Lowers, FNP   Given at 07/27/23 1008   hydrOXYzine (ATARAX) tablet 25 mg  25 mg Oral TID PRN [name], DO   25 mg at 07/26/23 1828   [START ON 07/30/2023] levothyroxine (SYNTHROID) tablet 50 mcg  50 mcg Oral Q0600 Golda Acre, MD       LORazepam (ATIVAN) tablet 2 mg  2 mg Oral TID PRN [name], NP   2 mg at 06/03/23 1110   Or   LORazepam (ATIVAN) injection 2 mg  2 mg Intramuscular TID PRN [name], NP       magnesium hydroxide (MILK OF MAGNESIA) suspension 30 mL  30 mL Oral Daily PRN [name], NP   30 mL at 07/18/23 1701   melatonin  tablet 5 mg  5 mg Oral QHS Nkwenti, Doris, NP   5 mg at 07/28/23 2107   nicotine polacrilex (NICORETTE) gum 2 mg  2 mg Oral PRN [name], MD   2 mg at 07/27/23 1437   paliperidone (INVEGA) 24 hr tablet 6 mg  6 mg Oral Daily [name], NP   6 mg at 07/28/23 2107   pantoprazole (PROTONIX) EC tablet 40 mg  40 mg Oral Daily [name], NP   40 mg at 07/29/23 0824   polyethylene glycol (MIRALAX / GLYCOLAX) packet 17 g  17 g Oral Daily PRN [name],  MD   17 g at 06/07/23 1847   selenium sulfide (SELSUN) 1 % shampoo   Topical Daily PRN Georgiann Cocker, MD   Given at 07/20/23 2218   sertraline (ZOLOFT) tablet 150 mg  150 mg Oral Daily Massengill, Nathan, MD   150 mg at 07/29/23 1610   SUMAtriptan (IMITREX) tablet 25 mg  25 mg Oral BID PRN [name], NP   25 mg at 07/23/23 1828   traZODone (DESYREL) tablet 50 mg  50 mg Oral QHS [name], NP   50 mg at 07/28/23 2107   Vitamin D (Ergocalciferol) (DRISDOL) 1.25 MG (50000 UNIT) capsule 50,000 Units  50,000 Units Oral Q7 days [name], NP   50,000 Units at 07/27/23 1434    Lab Results: No results found for this or any previous visit (from the past 48 hours).  Blood Alcohol level:  Lab Results  Component Value Date   ETH <10 12/19/2022   ETH <10 11/21/2019    Metabolic Disorder Labs: Lab Results  Component Value Date   HGBA1C 5.6 06/16/2023   MPG 114.02 06/16/2023   MPG 79.58 12/21/2022   No results found for: "PROLACTIN" Lab Results  Component Value Date   CHOL 256 (H) 06/16/2023   TRIG 224 (H) 06/16/2023   HDL 83 06/16/2023   CHOLHDL 3.1 06/16/2023   VLDL 45 (H) 06/16/2023   LDLCALC 128 (H) 06/16/2023   LDLCALC 149 (H) 12/21/2022    Physical Findings: AIMS: Facial and Oral Movements Muscles of Facial Expression: None Lips and Perioral Area: None Jaw: None Tongue: None,Extremity Movements Upper (arms, wrists, hands, fingers): None Lower (legs, knees, ankles, toes): None, Trunk  Movements Neck, shoulders, hips: None, Global Judgements Severity of abnormal movements overall : None Incapacitation due to abnormal movements: None Patient's awareness of abnormal movements: No Awareness, Dental Status Current problems with teeth and/or dentures?: No Does patient usually wear dentures?: No  CIWA:    COWS:     Musculoskeletal: Strength & Muscle Tone: within normal limits Gait & Station: normal Patient leans: N/A  Psychiatric Specialty Exam:  Mental Status Exam: Appearance & Hygiene:Normal Eye Contact: Good Attitude: Calm and cooperative Mood: "good" Affect: Congruent Speech:  Normal rate, amount, tone, and volume Thought Process: Linear and logical Perception (Hallucinations): No abnormal perceptions appreciated Thought Content: Denies suicidal or homicidal ideations Insight: Fair Judgement: Fair Cognition: Intact Orientation: A&O x 4 Memory: Short Term, grossly intact  Attention & Concentration: Grossly Intact Language: WNL Abstraction: Intact General Fund of Knowledge: Intact General Cognition: Intact Other: NA    Physical Exam: General: Sitting comfortably. NAD. HEENT: Normocephalic, atraumatic, MMM, EMOI Lungs: no increased work of breathing noted Heart: no cyanosis Abdomen: Non distended Musculoskeletal: FROM. No obvious deformities Skin: Warm, dry, intact. No rashes noted Neuro: No obvious focal deficits.  Gait and station are normal  Review of Systems  Constitutional: Negative.   HENT: Negative.    Eyes: Negative.   Respiratory: Negative.    Cardiovascular: Negative.   Gastrointestinal: Negative.   Genitourinary: Negative.   Skin: Negative.   Neurological: Negative.   Psychiatric/Behavioral:  Negative.    Blood pressure 109/69, pulse 80, temperature 98.3 F (36.8 C), resp. rate 19, height 4\' 11"  (1.499 m), weight 63 kg, SpO2 97%. Body mass index is 28.05 kg/m.   Treatment Plan Summary: Patient is at her baseline.  She is  tolerating her medications well.  There is no dangerousness.  She is stable for care as soon as we finalize safe an  appropriate disposition.  We will continue her medications and evaluate her further.  1.  Continue Invega ER 6 mg at bedtime. 2.  Continue sertraline 150 mg daily. 3.  Continue trazodone 50 mg as needed for insomnia. 4.  Continue to monitor mood behavior and interaction with others. 5.  Continue to encourage unit groups and therapeutic activities. 6.  Increase Synthroid to 50 mcg starting tomorrow for hypothyroidism   Golda Acre, MD 07/29/2023, 4:46 PM

## 2023-07-29 NOTE — Progress Notes (Signed)
CSW spoke with group home owner, Becky Sax who reported she continues to have reservations regarding bringing pt into her group home because of pt's past substance use disorder. CSW again shared with Ms. Freida Busman, that pt's substance abuse history is over 50 yrs old and pt continues to be in remission. CSW shared that this information was verified by  speaking to pt's mother, Mrs. Isley. Ms. Freida Busman continued to have reservations.  CSW shared with group home owner if she changes her mind to make contact.   . CSW will continue to follow and update team.

## 2023-07-29 NOTE — Progress Notes (Signed)
D:  Patient's self inventory sheet, patient has poor sleep, sleep medication not helpful.  Good appetite, low energy level, good concentration.  Rated depression, anxiety and hopeless #10.  Denied withdrawals.  Patient told nurse earlier this morning that she was NOT SI, contracts for safety.  Later patient told nurse that she does have SI thoughts off/on, contracts for safety, no plan.    Goal is  good placement after  [facility] discharge.  [facility]??  Concerned about medicaid problems.  Plans to keep doing everything right.  Discharge plan in the works. A:  Medications administered per MD orders.  Emotional support and encouragement given patient. R:  SI off/on, contracts for safety, no plan.  Denied A/V hallucinations.  Safety maintained with 15 minute checks. Safety maintained with 15 minute checks.

## 2023-07-30 ENCOUNTER — Encounter (HOSPITAL_COMMUNITY): Payer: Self-pay | Admitting: Psychiatry

## 2023-07-30 DIAGNOSIS — F251 Schizoaffective disorder, depressive type: Secondary | ICD-10-CM | POA: Diagnosis not present

## 2023-07-30 NOTE — Progress Notes (Signed)
   07/30/23 0900  Psych Admission Type (Psych Patients Only)  Admission Status Involuntary  Psychosocial Assessment  Patient Complaints Anxiety;Depression;Hopelessness  Eye Contact Fair  Facial Expression Sad;Flat  Affect Anxious;Depressed  Speech Soft  Interaction Childlike;Needy  Motor Activity Slow  Appearance/Hygiene Disheveled;Poor hygiene  Behavior Characteristics Cooperative;Anxious  Mood Depressed;Anxious  Thought Process  Coherency WDL  Content WDL  Delusions None reported or observed  Perception WDL  Hallucination None reported or observed  Judgment Impaired  Confusion None  Danger to Self  Current suicidal ideation? Denies  Description of Suicide Plan No plan  Self-Injurious Behavior No self-injurious ideation or behavior indicators observed or expressed   Agreement Not to Harm Self Yes  Description of Agreement Verbal  Danger to Others  Danger to Others None reported or observed  Danger to Others Abnormal  Harmful Behavior to others No threats or harm toward other people  Destructive Behavior No threats or harm toward property

## 2023-07-30 NOTE — Progress Notes (Signed)
   07/29/23 2019  Psych Admission Type (Psych Patients Only)  Admission Status Involuntary  Psychosocial Assessment  Patient Complaints Anxiety;Depression;Sadness  Eye Contact Fair  Facial Expression Anxious;Sad  Affect Anxious;Depressed  Speech Soft  Interaction Childlike  Motor Activity Slow  Appearance/Hygiene Disheveled  Behavior Characteristics Cooperative;Appropriate to situation  Mood Anxious;Depressed  Thought Process  Coherency WDL  Content WDL  Delusions None reported or observed  Perception WDL  Hallucination None reported or observed  Judgment Limited  Confusion None  Danger to Self  Current suicidal ideation? Denies  Self-Injurious Behavior No self-injurious ideation or behavior indicators observed or expressed   Agreement Not to Harm Self Yes  Description of Agreement verbal  Danger to Others  Danger to Others None reported or observed  Danger to Others Abnormal  Harmful Behavior to others No threats or harm toward other people  Destructive Behavior No threats or harm toward property

## 2023-07-30 NOTE — Group Note (Signed)
Recreation Therapy Group Note   Group Topic:Animal Assisted Therapy   Group Date: 07/30/2023 Start Time: 1610 End Time: 1030 Facilitators: Lucus Lambertson-McCall, LRT,CTRS Location: [address] Dayroom   Animal-Assisted Activity (AAA) Program Checklist/Progress Notes Patient Eligibility Criteria Checklist & Daily Group note for Rec Tx Intervention  AAA/T Program Assumption of Risk Form signed by Patient/ or Parent Legal Guardian Yes  Patient understands his/her participation is voluntary Yes  Education: Charity fundraiser, Appropriate Animal Interaction   Education Outcome: Acknowledges education.    Affect/Mood: N/A   Participation Level: Did not attend    Clinical Observations/Individualized Feedback:     Plan: Continue to engage patient in RT group sessions 2-3x/week.   Desmin Daleo-McCall, LRT,CTRS 07/30/2023 12:25 PM

## 2023-07-30 NOTE — Group Note (Signed)
LCSW Group Therapy Note   Group Date: 07/30/2023 Start Time: 1100 End Time: 1200   Type of Therapy and Topic:  Group Therapy  Topic:  Shining from Within:  Confidence and Self-Love Journey  Participation:  patient was present, listened but didn't speak.  Objective: Promote Self-Awareness and Realistic Self-Talk: Help participants recognize their strengths and replace negative thoughts with truthful, realistic statements to build confidence.  Goals: Increase Confidence: Help participants develop a positive self-image by focusing on their strengths and personal progress. Set Achievable Goals: Guide participants in creating small, realistic goals that foster a sense of accomplishment and build momentum. Enhance Self-Care Practices: Encourage participants to incorporate self-care activities into their routine to support emotional well-being and reinforce confidence.  Summary: The "Shining from Within: Confidence and Self-Love Journey" group helped individuals build stronger self-confidence through truthful, realistic self-talk, goal-setting, and self-care practices. Participants recognized their unique strengths, set achievable goals, and nurtured a supportive environment for mutual growth. The group provided practical tools to foster lasting confidence and self-belief, empowering each member to shine from within and embrace their personal power with greater self-assurance.  Therapeutic Modalities: Elements of CBT Elements of DBT  Yolanda [name], LCSWA 07/30/2023  6:02 PM

## 2023-07-30 NOTE — Progress Notes (Signed)
CSW left message for pt's mother Aianna Merklinger [phone] to schedule meeting regarding pt's discharge, awaiting call back.    3:30 pm CSW left message for pt's mother Aneeka Jen to schedule meeting regarding pt's discharge, awaiting call back.

## 2023-07-30 NOTE — Plan of Care (Signed)
  Problem: Education: Goal: Emotional status will improve Outcome: Progressing Goal: Mental status will improve Outcome: Progressing   

## 2023-07-30 NOTE — [facility] Group Notes (Signed)
[facility] Group Notes:  (Nursing/MHT/Case Management/Adjunct)  Date:  07/30/2023  Time:  9:04 PM  Type of Therapy:   Wrap-up group  Participation Level:  Active  Participation Quality:  Appropriate  Affect:  Appropriate  Cognitive:  Appropriate  Insight:  Appropriate  Engagement in Group:  Engaged  Modes of Intervention:  Education  Summary of Progress/Problems: Pt goal to get some sleep. Goal met per Pt. Rated day 6/10.  Yolanda [name] 07/30/2023, 9:04 PM

## 2023-07-30 NOTE — Progress Notes (Signed)
[facility] MD Progress Note  07/30/2023 12:10 PM Yolanda [name]  MRN:  [id number]  Subjective:   48 year old African-American female, single, on SSI disability, homeless.  Background history of intellectual disability, substance use disorder and schizoaffective disorder depressive type.  Presented via emergency services on account of worsening depression associated with suicidal thoughts.  Expressed thoughts of suicide by stabbing herself.  Chart reviewed today.  Patient discussed at multidisciplinary team meeting.  A second meeting was help with LCSWs to discuss placement options.  ACT Team has a written plan for pt to live with mother until Yolanda [name] placement can be secured.  Staff reports that she slept well last night.  No changes in her mental state.  No PRNs required.  Yolanda [name] was seen in her room during rounds.  She reported that her mood was "OK" today.  She denies any new psychiatric or medical complaints.  She reports that her appetite is good.  She reports that focus and concentration are adequate.  She denies issues with energy.  She reports adequate sleep.  She denies any medication side effects.  She denies suicidal ideations, homicidal ideations, auditory hallucinations, visual hallucinations, or delusions.  We discussed continuing her current treatment plan.  The patient is not able to live independently due to intellectual disability, and is pending placement.   Principal Problem: Schizoaffective disorder, depressive type (HCC) Diagnosis: Principal Problem:   Schizoaffective disorder, depressive type (HCC) Active Problems:   GERD (gastroesophageal reflux disease)   Intellectual disability   Tobacco use disorder  Total Time spent with patient: 30 minutes  Past Psychiatric History: Extensive history of mental illness.  She has been tried on multiple medications over the years.  See H&P for full details.  Past Medical History:  Past Medical History:  Diagnosis Date   Bipolar  affect, depressed (HCC)    Constipation 08/17/2022   Depression    Falls 07/21/2022   Fracture of femoral neck, right, closed (HCC) 01/17/2022   Herpes simplex 08/22/2017   Open fracture dislocation of right elbow joint 01/17/2022    Past Surgical History:  Procedure Laterality Date   NO PAST SURGERIES     SALPINGECTOMY     Family History: History reviewed. No pertinent family history. Family Psychiatric  History: Extensive family history of mood disorder. Social History:  Social History   Substance and Sexual Activity  Alcohol Use Yes     Social History   Substance and Sexual Activity  Drug Use Yes   Types: Cocaine, Marijuana    Social History   Socioeconomic History   Marital status: Single    Spouse name: Not on file   Number of children: Not on file   Years of education: Not on file   Highest education level: Not on file  Occupational History   Not on file  Tobacco Use   Smoking status: Every Day   Smokeless tobacco: Not on file  Substance and Sexual Activity   Alcohol use: Yes   Drug use: Yes    Types: Cocaine, Marijuana   Sexual activity: Yes  Other Topics Concern   Not on file  Social History Narrative   Not on file   Social Drivers of Health   Financial Resource Strain: Low Risk  (09/12/2022)   [facility] from [facility], [facility]   Overall Financial Resource Strain (CARDIA)    Difficulty of Paying Living Expenses: Not hard at all  Food Insecurity: Patient Declined (12/20/2022)   Hunger Vital Sign    Worried  About Running Out of Food in the Last Year: Patient declined    Ran Out of Food in the Last Year: Patient declined  Transportation Needs: No Transportation Needs (12/20/2022)   PRAPARE - Administrator, Civil Service (Medical): No    Lack of Transportation (Non-Medical): No  Physical Activity: Not on file  Stress: No Stress Concern Present (07/17/2022)   Received from [name]  Occupational Health - Occupational Stress Questionnaire    Feeling of Stress : Not at all  Social Connections: Unknown (07/16/2022)   Received from [facility], [facility]   Social Network    Social Network: Not on file     Current Medications: Current Facility-Administered Medications  Medication Dose Route Frequency Provider Last Rate Last Admin   acetaminophen (TYLENOL) tablet 650 mg  650 mg Oral Q6H PRN [name], NP   650 mg at 07/29/23 0909   alum & mag hydroxide-simeth (MAALOX/MYLANTA) 200-200-20 MG/5ML suspension 30 mL  30 mL Oral Q4H PRN Onuoha, Chinwendu V, NP   30 mL at 05/31/23 0801   atorvastatin (LIPITOR) tablet 10 mg  10 mg Oral Daily [name], NP   10 mg at 07/29/23 1847   busPIRone (BUSPAR) tablet 15 mg  15 mg Oral BID [name], NP   15 mg at 07/30/23 0745   cyanocobalamin (VITAMIN B12) injection 1,000 mcg  1,000 mcg Intramuscular Q30 days Sarita Bottom, MD   1,000 mcg at 07/03/23 1009   diphenhydrAMINE (BENADRYL) capsule 50 mg  50 mg Oral TID PRN [name], NP   50 mg at 06/03/23 1110   Or   diphenhydrAMINE (BENADRYL) injection 50 mg  50 mg Intramuscular TID PRN [name], NP       docusate sodium (COLACE) capsule 100 mg  100 mg Oral Daily Nkwenti, Tyler Aas, NP   100 mg at 07/30/23 0746   haloperidol (HALDOL) tablet 5 mg  5 mg Oral TID PRN [name], NP   5 mg at 06/03/23 1110   Or   haloperidol lactate (HALDOL) injection 5 mg  5 mg Intramuscular TID PRN [name], NP       hydrocortisone cream 1 %   Topical BID Cecilie Lowers, FNP   Given at 07/27/23 1008   hydrOXYzine (ATARAX) tablet 25 mg  25 mg Oral TID PRN [name], DO   25 mg at 07/26/23 1828   levothyroxine (SYNTHROID) tablet 50 mcg  50 mcg Oral Q0600 Golda Acre, MD   50 mcg at 07/30/23 1610   LORazepam (ATIVAN) tablet 2 mg  2 mg Oral TID PRN [name], NP   2 mg at 06/03/23 1110   Or   LORazepam (ATIVAN) injection 2 mg  2 mg Intramuscular TID PRN [name], NP        magnesium hydroxide (MILK OF MAGNESIA) suspension 30 mL  30 mL Oral Daily PRN [name], NP   30 mL at 07/18/23 1701   melatonin tablet 5 mg  5 mg Oral QHS Nkwenti, Doris, NP   5 mg at 07/29/23 2059   nicotine polacrilex (NICORETTE) gum 2 mg  2 mg Oral PRN [name], MD   2 mg at 07/27/23 1437   paliperidone (INVEGA) 24 hr tablet 6 mg  6 mg Oral Daily [name], NP   6 mg at 07/29/23 2059   pantoprazole (PROTONIX) EC tablet 40 mg  40 mg Oral Daily Nwoko,  Nicole Kindred I, NP   40 mg at 07/30/23 0746   polyethylene glycol (MIRALAX / GLYCOLAX) packet 17 g  17 g Oral Daily PRN [name], MD   17 g at 06/07/23 1847   selenium sulfide (SELSUN) 1 % shampoo   Topical Daily PRN Georgiann Cocker, MD   Given at 07/20/23 2218   sertraline (ZOLOFT) tablet 150 mg  150 mg Oral Daily Massengill, Harrold Donath, MD   150 mg at 07/30/23 0745   SUMAtriptan (IMITREX) tablet 25 mg  25 mg Oral BID PRN [name], NP   25 mg at 07/23/23 1828   traZODone (DESYREL) tablet 50 mg  50 mg Oral QHS [name], NP   50 mg at 07/29/23 2059   Vitamin D (Ergocalciferol) (DRISDOL) 1.25 MG (50000 UNIT) capsule 50,000 Units  50,000 Units Oral Q7 days [name], NP   50,000 Units at 07/27/23 1434    Lab Results: No results found for this or any previous visit (from the past 48 hours).  Blood Alcohol level:  Lab Results  Component Value Date   ETH <10 12/19/2022   ETH <10 11/21/2019    Metabolic Disorder Labs: Lab Results  Component Value Date   HGBA1C 5.6 06/16/2023   MPG 114.02 06/16/2023   MPG 79.58 12/21/2022   No results found for: "PROLACTIN" Lab Results  Component Value Date   CHOL 256 (H) 06/16/2023   TRIG 224 (H) 06/16/2023   HDL 83 06/16/2023   CHOLHDL 3.1 06/16/2023   VLDL 45 (H) 06/16/2023   LDLCALC 128 (H) 06/16/2023   LDLCALC 149 (H) 12/21/2022    Physical Findings: AIMS: Facial and Oral Movements Muscles of Facial Expression: None Lips and Perioral Area: None Jaw:  None Tongue: None,Extremity Movements Upper (arms, wrists, hands, fingers): None Lower (legs, knees, ankles, toes): None, Trunk Movements Neck, shoulders, hips: None, Global Judgements Severity of abnormal movements overall : None Incapacitation due to abnormal movements: None Patient's awareness of abnormal movements: No Awareness, Dental Status Current problems with teeth and/or dentures?: No Does patient usually wear dentures?: No  CIWA:    COWS:     Musculoskeletal: Strength & Muscle Tone: within normal limits Gait & Station: normal Patient leans: N/A  Psychiatric Specialty Exam:  Mental Status Exam: Appearance & Hygiene:Normal Eye Contact: Good Attitude: Calm and cooperative Mood: "good" Affect: Congruent Speech:  Normal rate, amount, tone, and volume Thought Process: Linear and logical Perception (Hallucinations): No abnormal perceptions appreciated Thought Content: Denies suicidal or homicidal ideations Insight: Fair Judgement: Fair Cognition: Intact Orientation: A&O x 4 Memory: Short Term, grossly intact  Attention & Concentration: Grossly Intact Language: WNL Abstraction: Intact General Fund of Knowledge: Intact General Cognition: Intact Other: NA    Physical Exam: General: Sitting comfortably. NAD. HEENT: Normocephalic, atraumatic, MMM, EMOI Lungs: no increased work of breathing noted Heart: no cyanosis Abdomen: Non distended Musculoskeletal: FROM. No obvious deformities Skin: Warm, dry, intact. No rashes noted Neuro: No obvious focal deficits.  Gait and station are normal  Review of Systems  Constitutional: Negative.   HENT: Negative.    Eyes: Negative.   Respiratory: Negative.    Cardiovascular: Negative.   Gastrointestinal: Negative.   Genitourinary: Negative.   Skin: Negative.   Neurological: Negative.   Psychiatric/Behavioral:  Negative.    Blood pressure 93/65, pulse 64, temperature 98.2 F (36.8 C), resp. rate 19, height 4\' 11"   (1.499 m), weight 63 kg, SpO2 97%. Body mass index is 28.05 kg/m.   Treatment Plan Summary: Patient is  at her baseline.  She is tolerating her medications well.  There is no dangerousness.  She is stable for care as soon as we finalize safe an appropriate disposition.  We will continue her medications and evaluate her further.  1.  Continue Invega ER 6 mg at bedtime. 2.  Continue sertraline 150 mg daily. 3.  Continue trazodone 50 mg as needed for insomnia. 4.  Continue to monitor mood behavior and interaction with others. 5.  Continue to encourage unit groups and therapeutic activities. 6.  Increase Synthroid to 50 mcg starting tomorrow for hypothyroidism   Golda Acre, MD 07/30/2023, 12:10 PM

## 2023-07-30 NOTE — Discharge Planning (Signed)
LCSW spoke with Group Home Representative Linden Dolin at [facility] regarding patient referral.  Per Ms. Roxan Hockey, she has been in constant contact with [facility] and reports she has been told that she will get the approval for the patient. Per Ms. Roxan Hockey, [facility] is working closely with her on finalizing documentations that need to be signed. Per Ms. Roxan Hockey, she will follow up on tomorrow as soon as she has an update regarding an admit date for the patient. Ms. Merilynn Finland requested that LCSW follow up with [facility] Manager Baldo Daub regarding [employer] being in place for the patient at discharge. Ms. Carolyn Stare reports she has also been in contact with DSS representative Thamas Jaegers regarding patient being set up with [facility] services. Ms. Roxan Hockey reports she is hopeful that a decision will be made on tomorrow. LCSW will continue to follow and provide support to patient and team.  Updates will be provided as received.  Fernande Boyden, LCSW Clinical Social Worker [facility] Ph: [phone]

## 2023-07-31 DIAGNOSIS — F251 Schizoaffective disorder, depressive type: Secondary | ICD-10-CM | POA: Diagnosis not present

## 2023-07-31 NOTE — Plan of Care (Signed)
  Problem: Activity: Goal: Sleeping patterns will improve Outcome: Progressing   Problem: Safety: Goal: Periods of time without injury will increase Outcome: Progressing   Problem: Coping: Goal: Coping ability will improve Outcome: Progressing   Problem: Medication: Goal: Compliance with prescribed medication regimen will improve Outcome: Progressing

## 2023-07-31 NOTE — Progress Notes (Signed)
   07/30/23 2122  Psych Admission Type (Psych Patients Only)  Admission Status Involuntary  Psychosocial Assessment  Patient Complaints Anxiety;Depression;Sadness  Eye Contact Fair  Facial Expression Flat;Sad  Affect Depressed  Speech Soft  Interaction Childlike;Needy  Motor Activity Slow  Appearance/Hygiene Disheveled;Poor hygiene  Behavior Characteristics Cooperative  Mood Depressed;Anxious  Thought Process  Coherency WDL  Content WDL  Delusions None reported or observed  Perception WDL  Hallucination None reported or observed  Judgment Impaired  Confusion None  Danger to Self  Current suicidal ideation? Denies  Self-Injurious Behavior No self-injurious ideation or behavior indicators observed or expressed   Agreement Not to Harm Self Yes  Description of Agreement verbal  Danger to Others  Danger to Others None reported or observed

## 2023-07-31 NOTE — Group Note (Signed)
Recreation Therapy Group Note   Group Topic:Other  Group Date: 07/31/2023 Start Time: 1405 End Time: 1445 Facilitators: Johan Creveling-McCall, LRT,CTRS Location: [address] Dayroom   Activity Description/Intervention: Therapeutic Drumming. Patients with peers and staff were given the opportunity to engage in a leader facilitated [facility] Empowerment Drumming Circle with staff from the [employer], in partnership with [employer]. Teaching laboratory technician and trained [employer], Theodoro Doing leading with LRT observing and documenting intervention and pt response. This evidenced-based practice targets 7 areas of health and wellbeing in the human experience including: stress-reduction, exercise, self-expression, camaraderie/support, nurturing, spirituality, and music-making (leisure).   Goal Area(s) Addresses:  Patient will engage in pro-social way in music group.  Patient will follow directions of drum leader on the first prompt. Patient will demonstrate no behavioral issues during group.  Patient will identify if a reduction in stress level occurs as a result of participation in therapeutic drum circle.    Education: Leisure exposure, Pharmacologist, Musical expression, Discharge Planning   Affect/Mood: N/A   Participation Level: Did not attend    Clinical Observations/Individualized Feedback:     Plan: Continue to engage patient in RT group sessions 2-3x/week.   Nykolas Bacallao-McCall, LRT,CTRS 07/31/2023 3:07 PM

## 2023-07-31 NOTE — Plan of Care (Signed)
  Problem: Safety: Goal: Periods of time without injury will increase Outcome: Progressing   

## 2023-07-31 NOTE — Group Note (Signed)
Date:  07/31/2023 Time:  4:46 PM  Group Topic/Focus:  Dimensions of Wellness:   The focus of this group is to introduce the topic of wellness and discuss the role each dimension of wellness plays in total health.    Participation Level:  Did Not Attend  Participation Quality:   n/a  Affect:   n/a  Cognitive:   n/a  Insight: None  Engagement in Group:   n/a  Modes of Intervention:   n/a  Additional Comments:   Pt did not attend.  Edmund Hilda Isaiah Cianci 07/31/2023, 4:46 PM

## 2023-07-31 NOTE — Group Note (Signed)
Date:  07/31/2023 Time:  10:39 AM  Group Topic/Focus:  Goals Group:   The focus of this group is to help patients establish daily goals to achieve during treatment and discuss how the patient can incorporate goal setting into their daily lives to aide in recovery. Orientation:   The focus of this group is to educate the patient on the purpose and policies of crisis stabilization and provide a format to answer questions about their admission.  The group details unit policies and expectations of patients while admitted.    Participation Level:  Did Not Attend  Participation Quality:   n/a  Affect:   n/a  Cognitive:   n/a  Insight: None  Engagement in Group:   n/a  Modes of Intervention:   n/a  Additional Comments:   Pt did not attend.  Edmund Hilda Ellan Tess 07/31/2023, 10:39 AM

## 2023-07-31 NOTE — Progress Notes (Signed)
   07/31/23 0553  15 Minute Checks  Location Bedroom  Visual Appearance Calm  Behavior Sleeping  Sleep (Behavioral Health Patients Only)  Calculate sleep? (Click Yes once per 24 hr at 0600 safety check) Yes  Documented sleep last 24 hours 10.5

## 2023-07-31 NOTE — Plan of Care (Signed)
  Problem: Education: Goal: Knowledge of [facility] Education information/materials will improve Outcome: Progressing Goal: Emotional status will improve Outcome: Progressing Goal: Mental status will improve Outcome: Progressing   Problem: Education: Goal: Ability to make informed decisions regarding treatment will improve Outcome: Progressing   Problem: Coping: Goal: Coping ability will improve Outcome: Progressing   Problem: Health Behavior/Discharge Planning: Goal: Identification of resources available to assist in meeting health care needs will improve Outcome: Progressing   Problem: Medication: Goal: Compliance with prescribed medication regimen will improve Outcome: Progressing   Problem: Self-Concept: Goal: Ability to disclose and discuss suicidal ideas will improve Outcome: Progressing

## 2023-07-31 NOTE — Progress Notes (Addendum)
[facility] MD Progress Note  07/31/2023 12:00 PM Yolanda [name]  MRN:  [id number]  Subjective:   48 year old African-American female, single, on SSI disability, homeless.  Background history of intellectual disability, substance use disorder and schizoaffective disorder depressive type.  Presented via emergency services on account of worsening depression associated with suicidal thoughts.  Expressed thoughts of suicide by stabbing herself.  Chart reviewed today.  Patient discussed at multidisciplinary team meeting.  Staff reports that she slept well last night.  No acute changes in her mental state.  No PRNs required.  [name] was seen in her room during rounds.  She reported that her mood was "sad" today.  She denies any new psychiatric or medical complaints otherwise.  She reports that her appetite is good.  She reports that focus and concentration are adequate.  She denies issues with energy, but speds most of the day in bed.  She reports adequate sleep.  She denies any medication side effects.  She denies suicidal ideations, homicidal ideations, auditory hallucinations, visual hallucinations, or delusions.  We discussed continuing her current treatment plan.  The patient is not able to live independently due to intellectual disability, and is pending placement.   Principal Problem: Schizoaffective disorder, depressive type (HCC) Diagnosis: Principal Problem:   Schizoaffective disorder, depressive type (HCC) Active Problems:   GERD (gastroesophageal reflux disease)   Intellectual disability   Tobacco use disorder  Total Time spent with patient: 30 minutes  Past Psychiatric History: Extensive history of mental illness.  She has been tried on multiple medications over the years.  See H&P for full details.  Past Medical History:  Past Medical History:  Diagnosis Date   Bipolar affect, depressed (HCC)    Constipation 08/17/2022   Depression    Falls 07/21/2022   Fracture of femoral neck,  right, closed (HCC) 01/17/2022   Herpes simplex 08/22/2017   Intellectual disability    Open fracture dislocation of right elbow joint 01/17/2022    Past Surgical History:  Procedure Laterality Date   NO PAST SURGERIES     SALPINGECTOMY     Family History: History reviewed. No pertinent family history. Family Psychiatric  History: Extensive family history of mood disorder. Social History:  Social History   Substance and Sexual Activity  Alcohol Use Yes     Social History   Substance and Sexual Activity  Drug Use Yes   Types: Cocaine, Marijuana    Social History   Socioeconomic History   Marital status: Single    Spouse name: Not on file   Number of children: Not on file   Years of education: Not on file   Highest education level: Not on file  Occupational History   Not on file  Tobacco Use   Smoking status: Every Day   Smokeless tobacco: Not on file  Substance and Sexual Activity   Alcohol use: Yes   Drug use: Yes    Types: Cocaine, Marijuana   Sexual activity: Yes  Other Topics Concern   Not on file  Social History Narrative   Not on file   Social Drivers of Health   Financial Resource Strain: Low Risk  (09/12/2022)   Received from [facility], [facility]   Overall Financial Resource Strain (CARDIA)    Difficulty of Paying Living Expenses: Not hard at all  Food Insecurity: Patient Declined (12/20/2022)   Hunger Vital Sign    Worried About Running Out of Food in the Last Year: Patient declined    Ran Out  of Food in the Last Year: Patient declined  Transportation Needs: No Transportation Needs (12/20/2022)   PRAPARE - Administrator, Civil Service (Medical): No    Lack of Transportation (Non-Medical): No  Physical Activity: Not on file  Stress: No Stress Concern Present (07/17/2022)   Received from [facility], [facility] - Occupational Stress Questionnaire    Feeling of Stress : Not at all   Social Connections: Unknown (07/16/2022)   Received from [facility], [facility]   Social Network    Social Network: Not on file     Current Medications: Current Facility-Administered Medications  Medication Dose Route Frequency Provider Last Rate Last Admin   acetaminophen (TYLENOL) tablet 650 mg  650 mg Oral Q6H PRN [name], NP   650 mg at 07/30/23 2127   alum & mag hydroxide-simeth (MAALOX/MYLANTA) 200-200-20 MG/5ML suspension 30 mL  30 mL Oral Q4H PRN Onuoha, Chinwendu V, NP   30 mL at 05/31/23 0801   atorvastatin (LIPITOR) tablet 10 mg  10 mg Oral Daily [name], NP   10 mg at 07/30/23 1810   busPIRone (BUSPAR) tablet 15 mg  15 mg Oral BID [name], NP   15 mg at 07/31/23 1610   cyanocobalamin (VITAMIN B12) injection 1,000 mcg  1,000 mcg Intramuscular Q30 days Sarita Bottom, MD   1,000 mcg at 07/03/23 1009   diphenhydrAMINE (BENADRYL) capsule 50 mg  50 mg Oral TID PRN [name], NP   50 mg at 06/03/23 1110   Or   diphenhydrAMINE (BENADRYL) injection 50 mg  50 mg Intramuscular TID PRN [name], NP       docusate sodium (COLACE) capsule 100 mg  100 mg Oral Daily Nkwenti, Tyler Aas, NP   100 mg at 07/31/23 9604   haloperidol (HALDOL) tablet 5 mg  5 mg Oral TID PRN [name], NP   5 mg at 06/03/23 1110   Or   haloperidol lactate (HALDOL) injection 5 mg  5 mg Intramuscular TID PRN [name], NP       hydrocortisone cream 1 %   Topical BID Cecilie Lowers, FNP   Given at 07/27/23 1008   hydrOXYzine (ATARAX) tablet 25 mg  25 mg Oral TID PRN [name], DO   25 mg at 07/26/23 1828   levothyroxine (SYNTHROID) tablet 50 mcg  50 mcg Oral Q0600 Golda Acre, MD   50 mcg at 07/31/23 5409   LORazepam (ATIVAN) tablet 2 mg  2 mg Oral TID PRN [name], NP   2 mg at 06/03/23 1110   Or   LORazepam (ATIVAN) injection 2 mg  2 mg Intramuscular TID PRN [name], NP       magnesium hydroxide (MILK OF MAGNESIA) suspension 30 mL  30 mL Oral Daily PRN  [name], NP   30 mL at 07/18/23 1701   melatonin tablet 5 mg  5 mg Oral QHS Nkwenti, Doris, NP   5 mg at 07/30/23 2122   nicotine polacrilex (NICORETTE) gum 2 mg  2 mg Oral PRN [name], MD   2 mg at 07/27/23 1437   paliperidone (INVEGA) 24 hr tablet 6 mg  6 mg Oral Daily [name], NP   6 mg at 07/30/23 2122   pantoprazole (PROTONIX) EC tablet 40 mg  40 mg Oral Daily [name], NP   40 mg at 07/31/23 0839   polyethylene glycol (MIRALAX /  GLYCOLAX) packet 17 g  17 g Oral Daily PRN [name], MD   17 g at 06/07/23 1847   selenium sulfide (SELSUN) 1 % shampoo   Topical Daily PRN Georgiann Cocker, MD   Given at 07/20/23 2218   sertraline (ZOLOFT) tablet 150 mg  150 mg Oral Daily Massengill, Harrold Donath, MD   150 mg at 07/31/23 1610   SUMAtriptan (IMITREX) tablet 25 mg  25 mg Oral BID PRN [name], NP   25 mg at 07/23/23 1828   traZODone (DESYREL) tablet 50 mg  50 mg Oral QHS [name], NP   50 mg at 07/30/23 2122   Vitamin D (Ergocalciferol) (DRISDOL) 1.25 MG (50000 UNIT) capsule 50,000 Units  50,000 Units Oral Q7 days [name], NP   50,000 Units at 07/27/23 1434    Lab Results: No results found for this or any previous visit (from the past 48 hours).  Blood Alcohol level:  Lab Results  Component Value Date   ETH <10 12/19/2022   ETH <10 11/21/2019    Metabolic Disorder Labs: Lab Results  Component Value Date   HGBA1C 5.6 06/16/2023   MPG 114.02 06/16/2023   MPG 79.58 12/21/2022   No results found for: "PROLACTIN" Lab Results  Component Value Date   CHOL 256 (H) 06/16/2023   TRIG 224 (H) 06/16/2023   HDL 83 06/16/2023   CHOLHDL 3.1 06/16/2023   VLDL 45 (H) 06/16/2023   LDLCALC 128 (H) 06/16/2023   LDLCALC 149 (H) 12/21/2022    Physical Findings: AIMS: Facial and Oral Movements Muscles of Facial Expression: None Lips and Perioral Area: None Jaw: None Tongue: None,Extremity Movements Upper (arms, wrists, hands, fingers):  None Lower (legs, knees, ankles, toes): None, Trunk Movements Neck, shoulders, hips: None, Global Judgements Severity of abnormal movements overall : None Incapacitation due to abnormal movements: None Patient's awareness of abnormal movements: No Awareness, Dental Status Current problems with teeth and/or dentures?: No Does patient usually wear dentures?: No  CIWA:    COWS:     Musculoskeletal: Strength & Muscle Tone: within normal limits Gait & Station: normal Patient leans: N/A  Psychiatric Specialty Exam:  Mental Status Exam: Appearance & Hygiene:Normal Eye Contact: Good Attitude: Calm and cooperative Mood: "sad" Affect: Congruent Speech:  Normal rate, amount, tone, and volume Thought Process: Linear and logical Perception (Hallucinations): No abnormal perceptions appreciated Thought Content: Denies suicidal or homicidal ideations Insight: Fair Judgement: Fair Cognition: Intact Orientation: A&O x 4 Memory: Short Term, grossly intact  Attention & Concentration: Grossly Intact Language: WNL Abstraction: Intact General Fund of Knowledge: Intact General Cognition: Intact Other: NA    Physical Exam: General: Sitting comfortably. NAD. HEENT: Normocephalic, atraumatic, MMM, EMOI Lungs: no increased work of breathing noted Heart: no cyanosis Abdomen: Non distended Musculoskeletal: FROM. No obvious deformities Skin: Warm, dry, intact. No rashes noted Neuro: No obvious focal deficits.  Gait and station are normal  Review of Systems  Constitutional: Negative.   HENT: Negative.    Eyes: Negative.   Respiratory: Negative.    Cardiovascular: Negative.   Gastrointestinal: Negative.   Genitourinary: Negative.   Skin: Negative.   Neurological: Negative.   Psychiatric/Behavioral:  Negative.    Blood pressure 95/67, pulse 88, temperature 98.2 F (36.8 C), resp. rate 19, height 4\' 11"  (1.499 m), weight 63 kg, SpO2 97%. Body mass index is 28.05 kg/m.   Treatment  Plan Summary: Patient is at her baseline.  She is tolerating her medications well.  There is no dangerousness.  She is stable for care as soon as we finalize safe an appropriate disposition.  We will continue her medications and evaluate her further.  1.  Continue Invega ER 6 mg at bedtime. 2.  Continue sertraline 150 mg daily. 3.  Continue trazodone 50 mg as needed for insomnia. 4.  Continue to monitor mood behavior and interaction with others. 5.  Continue to encourage unit groups and therapeutic activities. 6.  Continue Synthroid 50 mcg for hypothyroidism   Golda Acre, MD 07/31/2023, 12:00 PM

## 2023-08-01 DIAGNOSIS — F251 Schizoaffective disorder, depressive type: Secondary | ICD-10-CM | POA: Diagnosis not present

## 2023-08-01 LAB — LIPID PANEL
Cholesterol: 166 mg/dL (ref 0–200)
HDL: 59 mg/dL (ref >40)
LDL Cholesterol: 74 mg/dL (ref 0–99)
Total CHOL/HDL Ratio: 2.8 {ratio}
Triglycerides: 165 mg/dL — ABNORMAL HIGH (ref <150)
VLDL: 33 mg/dL (ref 0–40)

## 2023-08-01 LAB — TSH: TSH: 4.696 u[IU]/mL — ABNORMAL HIGH (ref 0.350–4.500)

## 2023-08-01 MED ORDER — SERTRALINE HCL 100 MG PO TABS
200.0000 mg | ORAL_TABLET | Freq: Every day | ORAL | Status: DC
Start: 2023-08-02 — End: 2023-08-09
  Administered 2023-08-02 – 2023-08-09 (×8): 200 mg via ORAL
  Filled 2023-08-01 (×5): qty 2
  Filled 2023-08-01: qty 14
  Filled 2023-08-01 (×5): qty 2

## 2023-08-01 NOTE — Progress Notes (Signed)
Family meeting schedule for 08/02/23 at 3:00 pm to discuss disposition.

## 2023-08-01 NOTE — Group Note (Signed)
[facility] LCSW Group Therapy Note   Group Date: 08/01/2023 Start Time: 1100 End Time: 1200   Type of Therapy/Topic:  Group Therapy:  Emotion Regulation  Participation Level:  None   Mood:n/a  Description of Group:    The purpose of this group is to assist patients in learning to regulate negative emotions and experience positive emotions. Patients will be guided to discuss ways in which they have been vulnerable to their negative emotions. These vulnerabilities will be juxtaposed with experiences of positive emotions or situations, and patients challenged to use positive emotions to combat negative ones. Special emphasis will be placed on coping with negative emotions in conflict situations, and patients will process healthy conflict resolution skills.  Therapeutic Goals: Patient will identify two positive emotions or experiences to reflect on in order to balance out negative emotions:  Patient will label two or more emotions that they find the most difficult to experience:  Patient will be able to demonstrate positive conflict resolution skills through discussion or role plays:   Summary of Patient Progress:   Pt was invited, did not attend    Therapeutic Modalities:   Cognitive Behavioral Therapy Feelings Identification Dialectical Behavioral Therapy   Steffanie Dunn, LCSW

## 2023-08-01 NOTE — [facility] Group Notes (Signed)
[facility] Group Notes:  (Nursing/MHT/Case Management/Adjunct)  Date:  08/01/2023  Time:  2000  Type of Therapy:   Wrap up group  Participation Level:  Active  Participation Quality:  Appropriate, Attentive, Sharing, and Supportive  Affect:  Appropriate  Cognitive:  Alert  Insight:  Improving  Engagement in Group:  Engaged  Modes of Intervention:  Clarification, Education, and Support  Summary of Progress/Problems: Positive thinking and positive change were discussed.   [name] 08/01/2023, 8:52 PM

## 2023-08-01 NOTE — Plan of Care (Signed)
  Problem: Education: Goal: Emotional status will improve Outcome: Progressing   Problem: Activity: Goal: Sleeping patterns will improve Outcome: Progressing   Problem: Safety: Goal: Periods of time without injury will increase Outcome: Progressing   Problem: Coping: Goal: Coping ability will improve Outcome: Progressing   Problem: Health Behavior/Discharge Planning: Goal: Identification of resources available to assist in meeting health care needs will improve Outcome: Progressing   Problem: Medication: Goal: Compliance with prescribed medication regimen will improve Outcome: Progressing

## 2023-08-01 NOTE — Progress Notes (Signed)
   07/31/23 2130  Psych Admission Type (Psych Patients Only)  Admission Status Involuntary  Psychosocial Assessment  Patient Complaints Anxiety;Depression;Sadness (Anxiety 10/10, Depression 10/10)  Eye Contact Fair  Facial Expression Sad;Flat  Affect Depressed  Speech Soft  Interaction Childlike;Needy  Motor Activity Slow  Appearance/Hygiene Disheveled  Behavior Characteristics Cooperative  Mood Depressed;Sad  Thought Process  Coherency WDL  Content WDL  Delusions None reported or observed  Perception WDL  Hallucination None reported or observed  Judgment Limited  Confusion None  Danger to Self  Current suicidal ideation? Denies  Self-Injurious Behavior No self-injurious ideation or behavior indicators observed or expressed   Agreement Not to Harm Self Yes  Description of Agreement Verbal  Danger to Others  Danger to Others None reported or observed  Danger to Others Abnormal  Harmful Behavior to others No threats or harm toward other people  Destructive Behavior No threats or harm toward property

## 2023-08-01 NOTE — Progress Notes (Signed)
[facility] MD Progress Note  08/01/2023 12:12 PM Yolanda [name]  MRN:  [id number]  Subjective:   48 year old African-American female, single, on SSI disability, homeless.  Background history of intellectual disability, substance use disorder and schizoaffective disorder depressive type.  Presented via emergency services on account of worsening depression associated with suicidal thoughts.  Expressed thoughts of suicide by stabbing herself.  Chart reviewed today.  Patient discussed at multidisciplinary team meeting.  Staff reports that she slept well last night.  No acute changes in her mental state.  No PRNs required.  Aubryanna was seen in her room during rounds.  She reported that her mood was "depressed" today.  She denies any new psychiatric or medical complaints otherwise.  She reports that her appetite is good.  She reports that focus and concentration are adequate.  She continues to deny issues with energy, but spends most of the day in bed.  She reports adequate sleep.  She denies any medication side effects.  She denies suicidal ideations, homicidal ideations, auditory hallucinations, visual hallucinations, or delusions.  We discussed increasing Zoloft to address mood concerns.  The patient is not able to live independently due to intellectual disability, and is pending placement.   Principal Problem: Schizoaffective disorder, depressive type (HCC) Diagnosis: Principal Problem:   Schizoaffective disorder, depressive type (HCC) Active Problems:   GERD (gastroesophageal reflux disease)   Intellectual disability   Tobacco use disorder  Total Time spent with patient: 30 minutes  Past Psychiatric History: Extensive history of mental illness.  She has been tried on multiple medications over the years.  See H&P for full details.  Past Medical History:  Past Medical History:  Diagnosis Date   Bipolar affect, depressed (HCC)    Constipation 08/17/2022   Depression    Falls 07/21/2022    Fracture of femoral neck, right, closed (HCC) 01/17/2022   Herpes simplex 08/22/2017   Intellectual disability    Open fracture dislocation of right elbow joint 01/17/2022    Past Surgical History:  Procedure Laterality Date   NO PAST SURGERIES     SALPINGECTOMY     Family History: History reviewed. No pertinent family history. Family Psychiatric  History: Extensive family history of mood disorder. Social History:  Social History   Substance and Sexual Activity  Alcohol Use Yes     Social History   Substance and Sexual Activity  Drug Use Yes   Types: Cocaine, Marijuana    Social History   Socioeconomic History   Marital status: Single    Spouse name: Not on file   Number of children: Not on file   Years of education: Not on file   Highest education level: Not on file  Occupational History   Not on file  Tobacco Use   Smoking status: Every Day   Smokeless tobacco: Not on file  Substance and Sexual Activity   Alcohol use: Yes   Drug use: Yes    Types: Cocaine, Marijuana   Sexual activity: Yes  Other Topics Concern   Not on file  Social History Narrative   Not on file   Social Drivers of Health   Financial Resource Strain: Low Risk  (09/12/2022)   Received from [facility], [facility]   Overall Financial Resource Strain (CARDIA)    Difficulty of Paying Living Expenses: Not hard at all  Food Insecurity: Patient Declined (12/20/2022)   Hunger Vital Sign    Worried About Running Out of Food in the Last Year: Patient declined  Ran Out of Food in the Last Year: Patient declined  Transportation Needs: No Transportation Needs (12/20/2022)   PRAPARE - Administrator, Civil Service (Medical): No    Lack of Transportation (Non-Medical): No  Physical Activity: Not on file  Stress: No Stress Concern Present (07/17/2022)   Received from [facility], [facility] of Occupational Health - Occupational Stress Questionnaire    Feeling of  Stress : Not at all  Social Connections: Unknown (07/16/2022)   Received from [facility], [facility]   Social Network    Social Network: Not on file     Current Medications: Current Facility-Administered Medications  Medication Dose Route Frequency Provider Last Rate Last Admin   acetaminophen (TYLENOL) tablet 650 mg  650 mg Oral Q6H PRN [name], NP   650 mg at 07/31/23 2138   alum & mag hydroxide-simeth (MAALOX/MYLANTA) 200-200-20 MG/5ML suspension 30 mL  30 mL Oral Q4H PRN Onuoha, Chinwendu V, NP   30 mL at 05/31/23 0801   atorvastatin (LIPITOR) tablet 10 mg  10 mg Oral Daily [name], NP   10 mg at 07/31/23 1703   busPIRone (BUSPAR) tablet 15 mg  15 mg Oral BID [name], NP   15 mg at 08/01/23 1006   cyanocobalamin (VITAMIN B12) injection 1,000 mcg  1,000 mcg Intramuscular Q30 days Sarita Bottom, MD   1,000 mcg at 07/03/23 1009   diphenhydrAMINE (BENADRYL) capsule 50 mg  50 mg Oral TID PRN [name], NP   50 mg at 06/03/23 1110   Or   diphenhydrAMINE (BENADRYL) injection 50 mg  50 mg Intramuscular TID PRN [name], NP       docusate sodium (COLACE) capsule 100 mg  100 mg Oral Daily Nkwenti, Doris, NP   100 mg at 08/01/23 1006   haloperidol (HALDOL) tablet 5 mg  5 mg Oral TID PRN [name], NP   5 mg at 06/03/23 1110   Or   haloperidol lactate (HALDOL) injection 5 mg  5 mg Intramuscular TID PRN [name], NP       hydrocortisone cream 1 %   Topical BID Cecilie Lowers, FNP   Given at 07/27/23 1008   hydrOXYzine (ATARAX) tablet 25 mg  25 mg Oral TID PRN [name], DO   25 mg at 07/31/23 2138   levothyroxine (SYNTHROID) tablet 50 mcg  50 mcg Oral Q0600 Golda Acre, MD   50 mcg at 08/01/23 8657   LORazepam (ATIVAN) tablet 2 mg  2 mg Oral TID PRN [name], NP   2 mg at 06/03/23 1110   Or   LORazepam (ATIVAN) injection 2 mg  2 mg Intramuscular TID PRN [name], NP       magnesium hydroxide (MILK OF MAGNESIA) suspension 30 mL  30 mL  Oral Daily PRN [name], NP   30 mL at 07/18/23 1701   melatonin tablet 5 mg  5 mg Oral QHS Nkwenti, Doris, NP   5 mg at 07/31/23 2138   nicotine polacrilex (NICORETTE) gum 2 mg  2 mg Oral PRN [name], MD   2 mg at 07/31/23 1416   paliperidone (INVEGA) 24 hr tablet 6 mg  6 mg Oral Daily [name], NP   6 mg at 07/31/23 2138   pantoprazole (PROTONIX) EC tablet 40 mg  40 mg Oral Daily [name], NP   40 mg at 08/01/23 1005   polyethylene glycol (  MIRALAX / GLYCOLAX) packet 17 g  17 g Oral Daily PRN [name], MD   17 g at 06/07/23 1847   selenium sulfide (SELSUN) 1 % shampoo   Topical Daily PRN Georgiann Cocker, MD   Given at 07/20/23 2218   sertraline (ZOLOFT) tablet 150 mg  150 mg Oral Daily Massengill, Nathan, MD   150 mg at 08/01/23 1005   SUMAtriptan (IMITREX) tablet 25 mg  25 mg Oral BID PRN [name], NP   25 mg at 07/23/23 1828   traZODone (DESYREL) tablet 50 mg  50 mg Oral QHS [name], NP   50 mg at 07/31/23 2138   Vitamin D (Ergocalciferol) (DRISDOL) 1.25 MG (50000 UNIT) capsule 50,000 Units  50,000 Units Oral Q7 days [name], NP   50,000 Units at 07/27/23 1434    Lab Results: No results found for this or any previous visit (from the past 48 hours).  Blood Alcohol level:  Lab Results  Component Value Date   ETH <10 12/19/2022   ETH <10 11/21/2019    Metabolic Disorder Labs: Lab Results  Component Value Date   HGBA1C 5.6 06/16/2023   MPG 114.02 06/16/2023   MPG 79.58 12/21/2022   No results found for: "PROLACTIN" Lab Results  Component Value Date   CHOL 256 (H) 06/16/2023   TRIG 224 (H) 06/16/2023   HDL 83 06/16/2023   CHOLHDL 3.1 06/16/2023   VLDL 45 (H) 06/16/2023   LDLCALC 128 (H) 06/16/2023   LDLCALC 149 (H) 12/21/2022    Physical Findings: AIMS: Facial and Oral Movements Muscles of Facial Expression: None Lips and Perioral Area: None Jaw: None Tongue: None,Extremity Movements Upper (arms, wrists, hands,  fingers): None Lower (legs, knees, ankles, toes): None, Trunk Movements Neck, shoulders, hips: None, Global Judgements Severity of abnormal movements overall : None Incapacitation due to abnormal movements: None Patient's awareness of abnormal movements: No Awareness, Dental Status Current problems with teeth and/or dentures?: No Does patient usually wear dentures?: No  CIWA:    COWS:     Musculoskeletal: Strength & Muscle Tone: within normal limits Gait & Station: normal Patient leans: N/A  Psychiatric Specialty Exam:  Mental Status Exam: Appearance & Hygiene:Normal Eye Contact: Good Attitude: Calm and cooperative Mood: "sad" Affect: Congruent Speech:  Normal rate, amount, tone, and volume Thought Process: Linear and logical Perception (Hallucinations): No abnormal perceptions appreciated Thought Content: Denies suicidal or homicidal ideations Insight: Fair Judgement: Fair Cognition: Intact Orientation: A&O x 4 Memory: Short Term, grossly intact  Attention & Concentration: Grossly Intact Language: WNL Abstraction: Intact General Fund of Knowledge: Intact General Cognition: Intact Other: NA    Physical Exam: General: Sitting comfortably. NAD. HEENT: Normocephalic, atraumatic, MMM, EMOI Lungs: no increased work of breathing noted Heart: no cyanosis Abdomen: Non distended Musculoskeletal: FROM. No obvious deformities Skin: Warm, dry, intact. No rashes noted Neuro: No obvious focal deficits.  Gait and station are normal  Review of Systems  Constitutional: Negative.   HENT: Negative.    Eyes: Negative.   Respiratory: Negative.    Cardiovascular: Negative.   Gastrointestinal: Negative.   Genitourinary: Negative.   Skin: Negative.   Neurological: Negative.   Psychiatric/Behavioral:  Negative.    Blood pressure 95/67, pulse 88, temperature 98.2 F (36.8 C), resp. rate 19, height 4\' 11"  (1.499 m), weight 63 kg, SpO2 97%. Body mass index is 28.05  kg/m.   Treatment Plan Summary: Patient is at her baseline.  She is tolerating her medications well.  There is no  dangerousness.  She is stable for care as soon as we finalize safe an appropriate disposition.  We will continue her medications and evaluate her further.  1.  Continue Invega ER 6 mg at bedtime. 2.  Increase sertraline to 200 mg daily. 3.  Continue trazodone 50 mg as needed for insomnia. 4.  Continue to monitor mood behavior and interaction with others. 5.  Continue to encourage unit groups and therapeutic activities. 6.  Continue Synthroid 50 mcg for hypothyroidism 7.  Recheck lipid panel and thyroid to see if labs have normalized. 8.  Check urinalysis due to increased sadness, lethargy, and behavioral change   Golda Acre, MD 08/01/2023, 12:12 PM

## 2023-08-01 NOTE — Progress Notes (Signed)
   08/01/23 1500  Psych Admission Type (Psych Patients Only)  Admission Status Involuntary  Psychosocial Assessment  Eye Contact Fair  Facial Expression Sad;Animated;Anxious  Affect Depressed  Speech Soft  Interaction Childlike  Motor Activity Slow  Appearance/Hygiene Disheveled  Behavior Characteristics Cooperative  Mood Labile  Thought Process  Coherency WDL  Content WDL  Delusions None reported or observed  Perception WDL  Hallucination None reported or observed  Judgment Limited  Confusion None  Danger to Self  Current suicidal ideation? Denies  Self-Injurious Behavior No self-injurious ideation or behavior indicators observed or expressed   Agreement Not to Harm Self Yes  Description of Agreement verbal  Danger to Others  Danger to Others None reported or observed  Danger to Others Abnormal  Harmful Behavior to others No threats or harm toward other people  Destructive Behavior No threats or harm toward property

## 2023-08-01 NOTE — Group Note (Signed)
Date:  08/01/2023 Time:  10:09 AM  Group Topic/Focus:  Goals Group:   The focus of this group is to help patients establish daily goals to achieve during treatment and discuss how the patient can incorporate goal setting into their daily lives to aide in recovery. Orientation:   The focus of this group is to educate the patient on the purpose and policies of crisis stabilization and provide a format to answer questions about their admission.  The group details unit policies and expectations of patients while admitted.    Participation Level:  Did Not Attend  Additional Comments:  Patient was encouraged to attend group multiple times.   Kaseem Vastine T Elsie Baynes 08/01/2023, 10:09 AM

## 2023-08-01 NOTE — [facility] Group Notes (Signed)
Spiritual care group on grief and loss facilitated by Chaplain Dyanne Carrel, Bcc and Arlyce Dice, Mdiv  Group Goal: Support / Education around grief and loss  Members engage in facilitated group support and psycho-social education.  Group Description:  Following introductions and group rules, group members engaged in facilitated group dialogue and support around topic of loss, with particular support around experiences of loss in their lives. Group Identified types of loss (relationships / self / things) and identified patterns, circumstances, and changes that precipitate losses. Reflected on thoughts / feelings around loss, normalized grief responses, and recognized variety in grief experience. Group encouraged individual reflection on safe space and on the coping skills that they are already utilizing.  Group drew on Adlerian / Rogerian and narrative framework  Patient Progress: [name] attended part of group, but left shortly after it began.

## 2023-08-01 NOTE — Plan of Care (Signed)
  Problem: Education: Goal: Knowledge of [facility] Education information/materials will improve Outcome: Progressing Goal: Emotional status will improve Outcome: Progressing Goal: Mental status will improve Outcome: Progressing Goal: Verbalization of understanding the information provided will improve Outcome: Progressing   Problem: Activity: Goal: Interest or engagement in activities will improve Outcome: Progressing Goal: Sleeping patterns will improve Outcome: Progressing   Problem: Coping: Goal: Ability to verbalize frustrations and anger appropriately will improve Outcome: Progressing Goal: Ability to demonstrate self-control will improve Outcome: Progressing   Problem: Health Behavior/Discharge Planning: Goal: Identification of resources available to assist in meeting health care needs will improve Outcome: Progressing Goal: Compliance with treatment plan for underlying cause of condition will improve Outcome: Progressing   Problem: Physical Regulation: Goal: Ability to maintain clinical measurements within normal limits will improve Outcome: Progressing   Problem: Safety: Goal: Periods of time without injury will increase Outcome: Progressing   Problem: Education: Goal: Ability to make informed decisions regarding treatment will improve Outcome: Progressing   Problem: Coping: Goal: Coping ability will improve Outcome: Progressing   Problem: Health Behavior/Discharge Planning: Goal: Identification of resources available to assist in meeting health care needs will improve Outcome: Progressing   Problem: Medication: Goal: Compliance with prescribed medication regimen will improve Outcome: Progressing   Problem: Self-Concept: Goal: Ability to disclose and discuss suicidal ideas will improve Outcome: Progressing   Problem: Education: Goal: Ability to state activities that reduce stress will improve Outcome: Progressing

## 2023-08-02 ENCOUNTER — Encounter (HOSPITAL_COMMUNITY): Payer: Self-pay

## 2023-08-02 DIAGNOSIS — F251 Schizoaffective disorder, depressive type: Secondary | ICD-10-CM | POA: Diagnosis not present

## 2023-08-02 LAB — URINALYSIS, W/ REFLEX TO CULTURE (INFECTION SUSPECTED)
Bilirubin Urine: NEGATIVE
Glucose, UA: NEGATIVE mg/dL
Hgb urine dipstick: NEGATIVE
Ketones, ur: NEGATIVE mg/dL
Nitrite: NEGATIVE
Protein, ur: NEGATIVE mg/dL
Specific Gravity, Urine: 1.021 (ref 1.005–1.030)
pH: 5 (ref 5.0–8.0)

## 2023-08-02 LAB — T3, FREE: T3, Free: 1.7 pg/mL — ABNORMAL LOW (ref 2.0–4.4)

## 2023-08-02 LAB — T4, FREE: Free T4: 0.55 ng/dL — ABNORMAL LOW (ref 0.61–1.12)

## 2023-08-02 MED ORDER — LEVOTHYROXINE SODIUM 75 MCG PO TABS
75.0000 ug | ORAL_TABLET | Freq: Every day | ORAL | Status: DC
  Administered 2023-08-03 – 2023-08-09 (×7): 75 ug via ORAL
  Filled 2023-08-02 (×4): qty 1
  Filled 2023-08-02: qty 7
  Filled 2023-08-02 (×4): qty 1

## 2023-08-02 NOTE — Group Note (Signed)
Date:  08/02/2023 Time:  1:04 PM  Group Topic/Focus:  Goals Group:   The focus of this group is to help patients establish daily goals to achieve during treatment and discuss how the patient can incorporate goal setting into their daily lives to aide in recovery. Orientation:   The focus of this group is to educate the patient on the purpose and policies of crisis stabilization and provide a format to answer questions about their admission.  The group details unit policies and expectations of patients while admitted.    Participation Level:  Did Not Attend  Participation Quality:   n/a  Affect:   n/a  Cognitive:   n/a  Insight: None  Engagement in Group:   n/a  Modes of Intervention:   n/a  Additional Comments:   Pt did not attend.  Edmund Hilda Alaiya Martindelcampo 08/02/2023, 1:04 PM

## 2023-08-02 NOTE — Progress Notes (Signed)
[facility] MD Progress Note  08/02/2023 3:38 PM Yolanda [name]  MRN:  [id number]  Subjective:   48 year old African-American female, single, on SSI disability, homeless.  Background history of intellectual disability, substance use disorder and schizoaffective disorder depressive type.  Presented via emergency services on account of worsening depression associated with suicidal thoughts.  Expressed thoughts of suicide by stabbing herself.  Chart reviewed today.  Patient discussed at multidisciplinary team meeting.  Staff reports that she slept well last night.  No acute changes in her mental state.  No PRNs required.  Daily notes: Yolanda [name] is seen in her room this morning. She presents alert, appears happy & smiling quite a bit, more than usual. She is making a good eye contact & verbally responsive. She has her hair braided by another patient. She says she is doing well. She hinted that she might be getting out of here & that she is looking forward to it. Per the SW, it appears Yolanda [name] has been accepted by a group home. Per the SW, there are some more paper work that are needed by the group home owner to finalize the process. At this time, there no changes noted on the part of the patient . She remains stable. There are no behavioral issues displayed by the patient. She denies any new issues. There are no changes made on her current plan of care. Continue as already in progress. Vital signs remain stable.  Principal Problem: Schizoaffective disorder, depressive type (HCC) Diagnosis: Principal Problem:   Schizoaffective disorder, depressive type (HCC) Active Problems:   GERD (gastroesophageal reflux disease)   Intellectual disability   Tobacco use disorder  Total Time spent with patient: 30 minutes  Past Psychiatric History: Extensive history of mental illness.  She has been tried on multiple medications over the years.  See H&P for full details.  Past Medical History:  Past Medical History:   Diagnosis Date   Bipolar affect, depressed (HCC)    Constipation 08/17/2022   Depression    Falls 07/21/2022   Fracture of femoral neck, right, closed (HCC) 01/17/2022   Herpes simplex 08/22/2017   Intellectual disability    Open fracture dislocation of right elbow joint 01/17/2022    Past Surgical History:  Procedure Laterality Date   NO PAST SURGERIES     SALPINGECTOMY     Family History: History reviewed. No pertinent family history. Family Psychiatric  History: Extensive family history of mood disorder. Social History:  Social History   Substance and Sexual Activity  Alcohol Use Yes     Social History   Substance and Sexual Activity  Drug Use Yes   Types: Cocaine, Marijuana    Social History   Socioeconomic History   Marital status: Single    Spouse name: Not on file   Number of children: Not on file   Years of education: Not on file   Highest education level: Not on file  Occupational History   Not on file  Tobacco Use   Smoking status: Every Day   Smokeless tobacco: Not on file  Substance and Sexual Activity   Alcohol use: Yes   Drug use: Yes    Types: Cocaine, Marijuana   Sexual activity: Yes  Other Topics Concern   Not on file  Social History Narrative   Not on file   Social Drivers of Health   Financial Resource Strain: Low Risk  (09/12/2022)   Received from [facility]   Overall Financial Resource Strain (CARDIA)  Difficulty of Paying Living Expenses: Not hard at all  Food Insecurity: Patient Declined (12/20/2022)   Hunger Vital Sign    Worried About Running Out of Food in the Last Year: Patient declined    Ran Out of Food in the Last Year: Patient declined  Transportation Needs: No Transportation Needs (12/20/2022)   PRAPARE - Administrator, Civil Service (Medical): No    Lack of Transportation (Non-Medical): No  Physical Activity: Not on file  Stress: No Stress Concern Present (07/17/2022)   Received from  [facility], [facility] of Occupational Health - Occupational Stress Questionnaire    Feeling of Stress : Not at all  Social Connections: Unknown (07/16/2022)   Received from [facility], [facility]   Social Network    Social Network: Not on file     Current Medications: Current Facility-Administered Medications  Medication Dose Route Frequency Provider Last Rate Last Admin   acetaminophen (TYLENOL) tablet 650 mg  650 mg Oral Q6H PRN [name], NP   650 mg at 08/01/23 2033   alum & mag hydroxide-simeth (MAALOX/MYLANTA) 200-200-20 MG/5ML suspension 30 mL  30 mL Oral Q4H PRN Onuoha, Chinwendu V, NP   30 mL at 05/31/23 0801   atorvastatin (LIPITOR) tablet 10 mg  10 mg Oral Daily [name], NP   10 mg at 08/01/23 1655   busPIRone (BUSPAR) tablet 15 mg  15 mg Oral BID [name], NP   15 mg at 08/02/23 0940   cyanocobalamin (VITAMIN B12) injection 1,000 mcg  1,000 mcg Intramuscular Q30 days Sarita Bottom, MD   1,000 mcg at 08/01/23 1235   diphenhydrAMINE (BENADRYL) capsule 50 mg  50 mg Oral TID PRN [name], NP   50 mg at 06/03/23 1110   Or   diphenhydrAMINE (BENADRYL) injection 50 mg  50 mg Intramuscular TID PRN [name], NP       docusate sodium (COLACE) capsule 100 mg  100 mg Oral Daily Nkwenti, Doris, NP   100 mg at 08/02/23 0940   haloperidol (HALDOL) tablet 5 mg  5 mg Oral TID PRN [name], NP   5 mg at 06/03/23 1110   Or   haloperidol lactate (HALDOL) injection 5 mg  5 mg Intramuscular TID PRN [name], NP       hydrocortisone cream 1 %   Topical BID Cecilie Lowers, FNP   Given at 07/27/23 1008   hydrOXYzine (ATARAX) tablet 25 mg  25 mg Oral TID PRN [name], DO   25 mg at 08/01/23 2033   [START ON 08/03/2023] levothyroxine (SYNTHROID) tablet 75 mcg  75 mcg Oral Q0600 Golda Acre, MD       LORazepam (ATIVAN) tablet 2 mg  2 mg Oral TID PRN [name], NP   2 mg at 06/03/23 1110   Or   LORazepam (ATIVAN) injection  2 mg  2 mg Intramuscular TID PRN [name], NP       magnesium hydroxide (MILK OF MAGNESIA) suspension 30 mL  30 mL Oral Daily PRN [name], NP   30 mL at 07/18/23 1701   melatonin tablet 5 mg  5 mg Oral QHS Nkwenti, Doris, NP   5 mg at 08/01/23 2033   nicotine polacrilex (NICORETTE) gum 2 mg  2 mg Oral PRN [name], MD   2 mg at 07/31/23 1416   paliperidone (INVEGA) 24 hr tablet 6 mg  6 mg Oral Daily  [name], NP   6 mg at 08/01/23 2033   pantoprazole (PROTONIX) EC tablet 40 mg  40 mg Oral Daily [name], NP   40 mg at 08/02/23 0940   polyethylene glycol (MIRALAX / GLYCOLAX) packet 17 g  17 g Oral Daily PRN [name], MD   17 g at 06/07/23 1847   selenium sulfide (SELSUN) 1 % shampoo   Topical Daily PRN Georgiann Cocker, MD   Given at 07/20/23 2218   sertraline (ZOLOFT) tablet 200 mg  200 mg Oral Daily Golda Acre, MD   200 mg at 08/02/23 0940   SUMAtriptan (IMITREX) tablet 25 mg  25 mg Oral BID PRN [name], NP   25 mg at 07/23/23 1828   traZODone (DESYREL) tablet 50 mg  50 mg Oral QHS [name], NP   50 mg at 08/01/23 2033   Vitamin D (Ergocalciferol) (DRISDOL) 1.25 MG (50000 UNIT) capsule 50,000 Units  50,000 Units Oral Q7 days [name], NP   50,000 Units at 07/27/23 1434    Lab Results:  Results for orders placed or performed during the hospital encounter of 12/20/22 (from the past 48 hours)  Urinalysis, w/ Reflex to Culture (Infection Suspected) -Urine, Clean Catch     Status: Abnormal   Collection Time: 08/01/23  7:43 AM  Result Value Ref Range   Specimen Source URINE, CLEAN CATCH    Color, Urine AMBER (A) YELLOW    Comment: BIOCHEMICALS MAY BE AFFECTED BY COLOR   APPearance TURBID (A) CLEAR   Specific Gravity, Urine 1.021 1.005 - 1.030   pH 5.0 5.0 - 8.0   Glucose, UA NEGATIVE NEGATIVE mg/dL   Hgb urine dipstick NEGATIVE NEGATIVE   Bilirubin Urine NEGATIVE NEGATIVE   Ketones, ur NEGATIVE NEGATIVE mg/dL   Protein, ur  NEGATIVE NEGATIVE mg/dL   Nitrite NEGATIVE NEGATIVE   Leukocytes,Ua LARGE (A) NEGATIVE   RBC / HPF 6-10 0 - 5 RBC/hpf   WBC, UA 21-50 0 - 5 WBC/hpf    Comment:        Reflex urine culture not performed if WBC <=10, OR if Squamous epithelial cells >5. If Squamous epithelial cells >5 suggest recollection.    Bacteria, UA RARE (A) NONE SEEN   Squamous Epithelial / HPF 0-5 0 - 5 /HPF    Comment: Performed at [facility], [address] [place], [place] [address]  TSH     Status: Abnormal   Collection Time: 08/01/23  6:16 PM  Result Value Ref Range   TSH 4.696 (H) 0.350 - 4.500 uIU/mL    Comment: Performed by a 3rd Generation assay with a functional sensitivity of <=0.01 uIU/mL. Performed at [facility], [address], [place], [place] [address]   T4, free     Status: Abnormal   Collection Time: 08/01/23  6:16 PM  Result Value Ref Range   Free T4 0.55 (L) 0.61 - 1.12 ng/dL    Comment: (NOTE) Biotin ingestion may interfere with free T4 tests. If the results are inconsistent with the TSH level, previous test results, or the clinical presentation, then consider biotin interference. If needed, order repeat testing after stopping biotin. Performed at [facility], [address], [place], [place] [address]   Lipid panel     Status: Abnormal   Collection Time: 08/01/23  6:16 PM  Result Value Ref Range   Cholesterol 166 0 - 200 mg/dL   Triglycerides 914 (H) <150 mg/dL  HDL 59 >40 mg/dL   Total CHOL/HDL Ratio 2.8 RATIO   VLDL 33 0 - 40 mg/dL   LDL Cholesterol 74 0 - 99 mg/dL    Comment:        Total Cholesterol/HDL:CHD Risk Coronary Heart Disease Risk Table                     Men   Women  1/2 Average Risk   3.4   3.3  Average Risk       5.0   4.4  2 X Average Risk   9.6   7.1  3 X Average Risk  23.4   11.0        Use the calculated Patient Ratio above and the CHD Risk Table to determine the patient's CHD Risk.        ATP III  CLASSIFICATION (LDL):  <100     mg/dL   Optimal  161-096  mg/dL   Near or Above                    Optimal  130-159  mg/dL   Borderline  045-409  mg/dL   High  >811     mg/dL   Very High Performed at [facility], [address], [place], [place] [address]     Blood Alcohol level:  Lab Results  Component Value Date   ETH <10 12/19/2022   ETH <10 11/21/2019    Metabolic Disorder Labs: Lab Results  Component Value Date   HGBA1C 5.6 06/16/2023   MPG 114.02 06/16/2023   MPG 79.58 12/21/2022   No results found for: "PROLACTIN" Lab Results  Component Value Date   CHOL 166 08/01/2023   TRIG 165 (H) 08/01/2023   HDL 59 08/01/2023   CHOLHDL 2.8 08/01/2023   VLDL 33 08/01/2023   LDLCALC 74 08/01/2023   LDLCALC 128 (H) 06/16/2023    Physical Findings: AIMS: Facial and Oral Movements Muscles of Facial Expression: None Lips and Perioral Area: None Jaw: None Tongue: None,Extremity Movements Upper (arms, wrists, hands, fingers): None Lower (legs, knees, ankles, toes): None, Trunk Movements Neck, shoulders, hips: None, Global Judgements Severity of abnormal movements overall : None Incapacitation due to abnormal movements: None Patient's awareness of abnormal movements: No Awareness, Dental Status Current problems with teeth and/or dentures?: No Does patient usually wear dentures?: No  CIWA:    COWS:     Musculoskeletal: Strength & Muscle Tone: within normal limits Gait & Station: normal Patient leans: N/A  Psychiatric Specialty Exam:  Mental Status Exam: Appearance & Hygiene:Normal Eye Contact: Good Attitude: Calm and cooperative Mood: "sad" Affect: Congruent Speech:  Normal rate, amount, tone, and volume Thought Process: Linear and logical Perception (Hallucinations): No abnormal perceptions appreciated Thought Content: Denies suicidal or homicidal ideations Insight: Fair Judgement: Fair Cognition: Intact Orientation: A&O x 4 Memory:  Short Term, grossly intact  Attention & Concentration: Grossly Intact Language: WNL Abstraction: Intact General Fund of Knowledge: Intact General Cognition: Intact Other: NA    Physical Exam: General: Sitting comfortably. NAD. HEENT: Normocephalic, atraumatic, MMM, EMOI Lungs: no increased work of breathing noted Heart: no cyanosis Abdomen: Non distended Musculoskeletal: FROM. No obvious deformities Skin: Warm, dry, intact. No rashes noted Neuro: No obvious focal deficits.  Gait and station are normal  Review of Systems  Constitutional: Negative.   HENT: Negative.    Eyes: Negative.   Respiratory: Negative.    Cardiovascular: Negative.   Gastrointestinal: Negative.   Genitourinary:  Negative.   Skin: Negative.   Neurological: Negative.   Psychiatric/Behavioral:  Negative.    Blood pressure 95/67, pulse 88, temperature 98.2 F (36.8 C), resp. rate 19, height 4\' 11"  (1.499 m), weight 63 kg, SpO2 97%. Body mass index is 28.05 kg/m.   Treatment Plan Summary: Patient is at her baseline.  She is tolerating her medications well.  There is no dangerousness.  She is stable for care as soon as we finalize safe an appropriate disposition.  We will continue her medications and evaluate her further.  1.  Continue Invega ER 6 mg at bedtime. 2.  Increase sertraline to 200 mg daily. 3.  Continue trazodone 50 mg as needed for insomnia. 4.  Continue to monitor mood behavior and interaction with others. 5.  Continue to encourage unit groups and therapeutic activities. 6.  Continue Synthroid 50 mcg for hypothyroidism 7.  Recheck lipid panel and thyroid to see if labs have normalized. 8.  Check urinalysis due to increased sadness, lethargy, and behavioral change   Armandina Stammer, NP 08/02/2023, 3:38 PM Patient ID: Yolanda [name], female   DOB: [birth year], 48 y.o.   MRN: [id number]

## 2023-08-02 NOTE — Discharge Planning (Signed)
LCSW received confirmation from Group Home Representative Janora Norlander regarding patient referral. Per Ms. Roxan Hockey, the patient has been approved by [facility] for group home services at her location here in [place], [place]. Per Ms. Roxan Hockey, she is currently waiting on the contract to be sent by [facility] via email. Mrs. Stanton Kidney reports she would like for the patient to also be established with PSR/ACTT services prior to admitting the patient into her home. Mrs. Roxan Hockey made aware that LCSW will follow up with LCSW at [facility] regarding services in place and will follow up. Per Mrs. Roxan Hockey, she will follow up as soon as she has confirmation of contract.  LCSW, Group Home Representative Janora Norlander, and [facility] Supervisor [name] spoke via telephone regarding the patient. Per Mrs. Remonia Richter, the patient has been established with ACT services since last week. Plan was for patient to be established with services and then return home to her mother's until group home placement was secured as the patient has had a lengthy stay in the hospital.  This reported to be a concern for Mrs. Stanton Kidney, as she prefers door to door transfers for her patients. Mrs. Verner Chol explained safety precautions taken to ensure the patient would receive wrap around services until that time. Group Home informed team that if ACTT services are established then the only thing pending is the contract from [place]. Per Mrs. Stanton Kidney, if she receives that on today then she can plan to admit the patient on Monday 08/04/2022. Group Home Representative aware that the [facility] team will be meeting on today regarding disposition plans for the patient. Updates to be provided as received. Mrs. Stanton Kidney provided her contact information for additional follow up as needed.    LCSW and [facility] Baldo Daub spoke regarding patient's case. Per Kathrin Greathouse, the patient does not qualify for [employer], however patient would be appropriate for  Individual and Audiological scientist.  Per Jolan, the process for those services can take up to 30 days for approval and [facility] does not have the ability to expedite services. Group Home Representative was made aware of this and reports being willing to accept the patient while the services are being applied for. Per Kathrin Greathouse, she will work with DSS Representative Thamas Jaegers to arrange appointment for screening to be completed for [facility] services. No other needs to report at this time.  Fernande Boyden, LCSW Clinical Social Worker [facility] Ph: [phone]

## 2023-08-02 NOTE — Group Note (Signed)
Recreation Therapy Group Note   Group Topic:Team Building  Group Date: 08/02/2023 Start Time: 0930 End Time: 1000 Facilitators: Karianne Nogueira-McCall, LRT,CTRS Location: [address] Dayroom   Group Topic: Communication, Team Building, Problem Solving  Goal Area(s) Addresses:  Patient will effectively work with peer towards shared goal.  Patient will identify skills used to make activity successful.  Patient will share challenges and verbalize solution-driven approaches used. Patient will identify how skills used during activity can be used to reach post d/c goals.   Intervention: STEM Activity   Activity: [employer]. Patients were provided the following materials: 4 drinking straws, 5 rubber bands, 5 paper clips, 2 index cards, and 2 drinking cups. Using the provided materials patients were asked to build a launching mechanism to launch a ping pong ball across the room, approximately 10 feet. Patients were divided into teams of 3-5. Instructions required all materials be incorporated into the device, functionality of items left to the peer group's discretion.  Education: Pharmacist, community, Scientist, physiological, Air cabin crew, Building control surveyor.   Education Outcome: Acknowledges education/In group clarification offered/Needs additional education.    Affect/Mood: N/A   Participation Level: Did not attend    Clinical Observations/Individualized Feedback:     Plan: Continue to engage patient in RT group sessions 2-3x/week.   Latravion Graves-McCall, LRT,CTRS  08/02/2023 11:36 AM

## 2023-08-02 NOTE — Progress Notes (Signed)
[facility] MD Progress Note  08/02/2023 12:05 PM Yolanda [name]  MRN:  [id number]  Subjective:   48 year old African-American female, single, on SSI disability, homeless.  Background history of intellectual disability, substance use disorder and schizoaffective disorder depressive type.  Presented via emergency services on account of worsening depression associated with suicidal thoughts.  Expressed thoughts of suicide by stabbing herself.  Chart reviewed today.  Patient discussed at multidisciplinary team meeting.  Staff reports that she slept well last night.  No acute changes in her mental state.  No PRNs required.  There is a multidisciplinary meeting today to work on a disposition for the patient.  Yolanda [name] was seen in her room during rounds.  She reported that her mood was "a little better" today.  She denies any new psychiatric or medical complaints otherwise.  She reports that her appetite is good.  She reports that focus and concentration are adequate.  She continues to deny issues with energy, but spends most of the day in bed.  She reports adequate sleep.  She denies any medication side effects.  She denies suicidal ideations, homicidal ideations, auditory hallucinations, visual hallucinations, or delusions.  She denies side effects from increasing Zoloft.  The patient is not able to live independently due to intellectual disability, and is pending placement.   Principal Problem: Schizoaffective disorder, depressive type (HCC) Diagnosis: Principal Problem:   Schizoaffective disorder, depressive type (HCC) Active Problems:   GERD (gastroesophageal reflux disease)   Intellectual disability   Tobacco use disorder  Total Time spent with patient: 30 minutes  Past Psychiatric History: Extensive history of mental illness.  She has been tried on multiple medications over the years.  See H&P for full details.  Past Medical History:  Past Medical History:  Diagnosis Date   Bipolar affect,  depressed (HCC)    Constipation 08/17/2022   Depression    Falls 07/21/2022   Fracture of femoral neck, right, closed (HCC) 01/17/2022   Herpes simplex 08/22/2017   Intellectual disability    Open fracture dislocation of right elbow joint 01/17/2022    Past Surgical History:  Procedure Laterality Date   NO PAST SURGERIES     SALPINGECTOMY     Family History: History reviewed. No pertinent family history. Family Psychiatric  History: Extensive family history of mood disorder. Social History:  Social History   Substance and Sexual Activity  Alcohol Use Yes     Social History   Substance and Sexual Activity  Drug Use Yes   Types: Cocaine, Marijuana    Social History   Socioeconomic History   Marital status: Single    Spouse name: Not on file   Number of children: Not on file   Years of education: Not on file   Highest education level: Not on file  Occupational History   Not on file  Tobacco Use   Smoking status: Every Day   Smokeless tobacco: Not on file  Substance and Sexual Activity   Alcohol use: Yes   Drug use: Yes    Types: Cocaine, Marijuana   Sexual activity: Yes  Other Topics Concern   Not on file  Social History Narrative   Not on file   Social Drivers of Health   Financial Resource Strain: Low Risk  (09/12/2022)   Received [facility], [facility]   Overall Financial Resource Strain (CARDIA)    Difficulty of Paying Living Expenses: Not hard at all  Food Insecurity: Patient Declined (12/20/2022)   Hunger Vital Sign  Worried About Programme researcher, broadcasting/film/video in the Last Year: Patient declined    Barista in the Last Year: Patient declined  Transportation Needs: No Transportation Needs (12/20/2022)   PRAPARE - Administrator, Civil Service (Medical): No    Lack of Transportation (Non-Medical): No  Physical Activity: Not on file  Stress: No Stress Concern Present (07/17/2022)   Received from [facility], [facility] - Occupational Stress Questionnaire    Feeling of Stress : Not at all  Social Connections: Unknown (07/16/2022)   Received from [facility], [facility]   Social Network    Social Network: Not on file     Current Medications: Current Facility-Administered Medications  Medication Dose Route Frequency Provider Last Rate Last Admin   acetaminophen (TYLENOL) tablet 650 mg  650 mg Oral Q6H PRN [name], NP   650 mg at 08/01/23 2033   alum & mag hydroxide-simeth (MAALOX/MYLANTA) 200-200-20 MG/5ML suspension 30 mL  30 mL Oral Q4H PRN Onuoha, Chinwendu V, NP   30 mL at 05/31/23 0801   atorvastatin (LIPITOR) tablet 10 mg  10 mg Oral Daily [name], NP   10 mg at 08/01/23 1655   busPIRone (BUSPAR) tablet 15 mg  15 mg Oral BID [name], NP   15 mg at 08/02/23 0940   cyanocobalamin (VITAMIN B12) injection 1,000 mcg  1,000 mcg Intramuscular Q30 days Sarita Bottom, MD   1,000 mcg at 08/01/23 1235   diphenhydrAMINE (BENADRYL) capsule 50 mg  50 mg Oral TID PRN [name], NP   50 mg at 06/03/23 1110   Or   diphenhydrAMINE (BENADRYL) injection 50 mg  50 mg Intramuscular TID PRN [name], NP       docusate sodium (COLACE) capsule 100 mg  100 mg Oral Daily Nkwenti, Doris, NP   100 mg at 08/02/23 0940   haloperidol (HALDOL) tablet 5 mg  5 mg Oral TID PRN [name], NP   5 mg at 06/03/23 1110   Or   haloperidol lactate (HALDOL) injection 5 mg  5 mg Intramuscular TID PRN [name], NP       hydrocortisone cream 1 %   Topical BID Cecilie Lowers, FNP   Given at 07/27/23 1008   hydrOXYzine (ATARAX) tablet 25 mg  25 mg Oral TID PRN [name], DO   25 mg at 08/01/23 2033   levothyroxine (SYNTHROID) tablet 50 mcg  50 mcg Oral Q0600 Golda Acre, MD   50 mcg at 08/02/23 1610   LORazepam (ATIVAN) tablet 2 mg  2 mg Oral TID PRN [name], NP   2 mg at 06/03/23 1110   Or   LORazepam (ATIVAN) injection 2 mg  2 mg Intramuscular TID PRN  [name], NP       magnesium hydroxide (MILK OF MAGNESIA) suspension 30 mL  30 mL Oral Daily PRN [name], NP   30 mL at 07/18/23 1701   melatonin tablet 5 mg  5 mg Oral QHS Nkwenti, Doris, NP   5 mg at 08/01/23 2033   nicotine polacrilex (NICORETTE) gum 2 mg  2 mg Oral PRN [name], MD   2 mg at 07/31/23 1416   paliperidone (INVEGA) 24 hr tablet 6 mg  6 mg Oral Daily [name], NP   6 mg at 08/01/23 2033   pantoprazole (PROTONIX) EC tablet 40 mg  40 mg Oral Daily  [name], NP   40 mg at 08/02/23 0940   polyethylene glycol (MIRALAX / GLYCOLAX) packet 17 g  17 g Oral Daily PRN [name], MD   17 g at 06/07/23 1847   selenium sulfide (SELSUN) 1 % shampoo   Topical Daily PRN Georgiann Cocker, MD   Given at 07/20/23 2218   sertraline (ZOLOFT) tablet 200 mg  200 mg Oral Daily Golda Acre, MD   200 mg at 08/02/23 0940   SUMAtriptan (IMITREX) tablet 25 mg  25 mg Oral BID PRN [name], NP   25 mg at 07/23/23 1828   traZODone (DESYREL) tablet 50 mg  50 mg Oral QHS [name], NP   50 mg at 08/01/23 2033   Vitamin D (Ergocalciferol) (DRISDOL) 1.25 MG (50000 UNIT) capsule 50,000 Units  50,000 Units Oral Q7 days [name], NP   50,000 Units at 07/27/23 1434    Lab Results:  Results for orders placed or performed during the hospital encounter of 12/20/22 (from the past 48 hours)  Urinalysis, w/ Reflex to Culture (Infection Suspected) -Urine, Clean Catch     Status: Abnormal   Collection Time: 08/01/23  7:43 AM  Result Value Ref Range   Specimen Source URINE, CLEAN CATCH    Color, Urine AMBER (A) YELLOW    Comment: BIOCHEMICALS MAY BE AFFECTED BY COLOR   APPearance TURBID (A) CLEAR   Specific Gravity, Urine 1.021 1.005 - 1.030   pH 5.0 5.0 - 8.0   Glucose, UA NEGATIVE NEGATIVE mg/dL   Hgb urine dipstick NEGATIVE NEGATIVE   Bilirubin Urine NEGATIVE NEGATIVE   Ketones, ur NEGATIVE NEGATIVE mg/dL   Protein, ur NEGATIVE NEGATIVE mg/dL   Nitrite  NEGATIVE NEGATIVE   Leukocytes,Ua LARGE (A) NEGATIVE   RBC / HPF 6-10 0 - 5 RBC/hpf   WBC, UA 21-50 0 - 5 WBC/hpf    Comment:        Reflex urine culture not performed if WBC <=10, OR if Squamous epithelial cells >5. If Squamous epithelial cells >5 suggest recollection.    Bacteria, UA RARE (A) NONE SEEN   Squamous Epithelial / HPF 0-5 0 - 5 /HPF    Comment: Performed at [facility], [address] [place], [place] [address]  TSH     Status: Abnormal   Collection Time: 08/01/23  6:16 PM  Result Value Ref Range   TSH 4.696 (H) 0.350 - 4.500 uIU/mL    Comment: Performed by a 3rd Generation assay with a functional sensitivity of <=0.01 uIU/mL. Performed at [facility], [address], [place], [place] [address]   T4, free     Status: Abnormal   Collection Time: 08/01/23  6:16 PM  Result Value Ref Range   Free T4 0.55 (L) 0.61 - 1.12 ng/dL    Comment: (NOTE) Biotin ingestion may interfere with free T4 tests. If the results are inconsistent with the TSH level, previous test results, or the clinical presentation, then consider biotin interference. If needed, order repeat testing after stopping biotin. Performed at [facility], [address], [place], [place] [address]   Lipid panel     Status: Abnormal   Collection Time: 08/01/23  6:16 PM  Result Value Ref Range   Cholesterol 166 0 - 200 mg/dL   Triglycerides 914 (H) <150 mg/dL   HDL 59 >78 mg/dL   Total CHOL/HDL Ratio 2.8 RATIO   VLDL 33 0 - 40 mg/dL   LDL  Cholesterol 74 0 - 99 mg/dL    Comment:        Total Cholesterol/HDL:CHD Risk Coronary Heart Disease Risk Table                     Men   Women  1/2 Average Risk   3.4   3.3  Average Risk       5.0   4.4  2 X Average Risk   9.6   7.1  3 X Average Risk  23.4   11.0        Use the calculated Patient Ratio above and the CHD Risk Table to determine the patient's CHD Risk.        ATP III CLASSIFICATION (LDL):  <100      mg/dL   Optimal  147-829  mg/dL   Near or Above                    Optimal  130-159  mg/dL   Borderline  562-130  mg/dL   High  >865     mg/dL   Very High Performed at [facility], [address], [place], [place] [address]     Blood Alcohol level:  Lab Results  Component Value Date   ETH <10 12/19/2022   ETH <10 11/21/2019    Metabolic Disorder Labs: Lab Results  Component Value Date   HGBA1C 5.6 06/16/2023   MPG 114.02 06/16/2023   MPG 79.58 12/21/2022   No results found for: "PROLACTIN" Lab Results  Component Value Date   CHOL 166 08/01/2023   TRIG 165 (H) 08/01/2023   HDL 59 08/01/2023   CHOLHDL 2.8 08/01/2023   VLDL 33 08/01/2023   LDLCALC 74 08/01/2023   LDLCALC 128 (H) 06/16/2023    Physical Findings: AIMS: Facial and Oral Movements Muscles of Facial Expression: None Lips and Perioral Area: None Jaw: None Tongue: None,Extremity Movements Upper (arms, wrists, hands, fingers): None Lower (legs, knees, ankles, toes): None, Trunk Movements Neck, shoulders, hips: None, Global Judgements Severity of abnormal movements overall : None Incapacitation due to abnormal movements: None Patient's awareness of abnormal movements: No Awareness, Dental Status Current problems with teeth and/or dentures?: No Does patient usually wear dentures?: No  CIWA:    COWS:     Musculoskeletal: Strength & Muscle Tone: within normal limits Gait & Station: normal Patient leans: N/A  Psychiatric Specialty Exam:  Mental Status Exam: Appearance & Hygiene:Normal Eye Contact: Good Attitude: Calm and cooperative Mood: "sad" Affect: Congruent Speech:  Normal rate, amount, tone, and volume Thought Process: Linear and logical Perception (Hallucinations): No abnormal perceptions appreciated Thought Content: Denies suicidal or homicidal ideations Insight: Fair Judgement: Fair Cognition: Intact Orientation: A&O x 4 Memory: Short Term, grossly intact   Attention & Concentration: Grossly Intact Language: WNL Abstraction: Intact General Fund of Knowledge: Intact General Cognition: Intact Other: NA    Physical Exam: General: Sitting comfortably. NAD. HEENT: Normocephalic, atraumatic, MMM, EMOI Lungs: no increased work of breathing noted Heart: no cyanosis Abdomen: Non distended Musculoskeletal: FROM. No obvious deformities Skin: Warm, dry, intact. No rashes noted Neuro: No obvious focal deficits.  Gait and station are normal  Review of Systems  Constitutional: Negative.   HENT: Negative.    Eyes: Negative.   Respiratory: Negative.    Cardiovascular: Negative.   Gastrointestinal: Negative.   Genitourinary: Negative.   Skin: Negative.   Neurological: Negative.   Psychiatric/Behavioral:  Negative.    Blood pressure 95/67, pulse 88,  temperature 98.2 F (36.8 C), resp. rate 19, height 4\' 11"  (1.499 m), weight 63 kg, SpO2 97%. Body mass index is 28.05 kg/m.   Treatment Plan Summary: Patient is at her baseline.  She is tolerating her medications well.  There is no dangerousness.  She is stable for care as soon as we finalize safe an appropriate disposition.  We will continue her medications and evaluate her further.  1.  Continue Invega ER 6 mg at bedtime. 2.  Continue sertraline 200 mg daily. 3.  Continue trazodone 50 mg as needed for insomnia. 4.  Continue to monitor mood behavior and interaction with others. 5.  Continue to encourage unit groups and therapeutic activities. 6.  Increase Synthroid to 75 mcg for hypothyroidism 7.  Recheck lipid panel and thyroid to see if labs have normalized. 8.  Urinalysis from yesterday was not indicative of UTI, hyperlipidemia has resolved, and hypertriglyceridemia is improving.  TSH is still high, and T4 still low.   Golda Acre, MD 08/02/2023, 12:05 PM

## 2023-08-02 NOTE — Progress Notes (Incomplete)
Disposition meeting held with [facility], [facility] services, Ammie Ferrier, [employer], Mrs. Byram, [place] [facility] [place], Thamas Jaegers, [facility] psychiatrist, [facility]. [facility] owner, Linden Dolin [phone], pt's mother, Shanielle Correll, pt/Axel, [facility] Supervisor, [name] and CSW's Aldean Ast and Derrell Lolling.  Ms. Roxan Hockey, shared she has received  the patient has been approved by [facility] for group home services at her location   here in [place], [place]. Per Ms. Roxan Hockey, she is currently waiting on the contract to be  sent by [facility] via email.

## 2023-08-02 NOTE — Group Note (Signed)
Date:  08/02/2023 Time:  4:56 PM  Group Topic/Focus:  Social Wellness (Self and Interpersonal)    Participation Level:  Active  Participation Quality:  Appropriate  Affect:  Appropriate  Cognitive:  Appropriate  Insight: Appropriate  Engagement in Group:  Engaged  Modes of Intervention:  Activity, Problem-solving, and Socialization  Additional Comments:   Pt attended and actively participated in the Social Wellness group. Pt practiced teamwork, used problem solving, communication, and critical thinking skills to complete the social activity.  Yolanda [name] 08/02/2023, 4:56 PM

## 2023-08-02 NOTE — Plan of Care (Signed)
  Problem: Education: Goal: Mental status will improve Outcome: Adequate for Discharge   Problem: Safety: Goal: Periods of time without injury will increase Outcome: Adequate for Discharge   Problem: Coping: Goal: Coping ability will improve Outcome: Adequate for Discharge   Problem: Health Behavior/Discharge Planning: Goal: Identification of resources available to assist in meeting health care needs will improve Outcome: Adequate for Discharge   Problem: Medication: Goal: Compliance with prescribed medication regimen will improve Outcome: Adequate for Discharge

## 2023-08-02 NOTE — Progress Notes (Signed)
   08/01/23 2000  Psych Admission Type (Psych Patients Only)  Admission Status Involuntary  Psychosocial Assessment  Patient Complaints Sadness  Eye Contact Fair  Facial Expression Sad;Flat  Affect Depressed  Speech Soft  Interaction Childlike;Needy  Motor Activity Slow  Appearance/Hygiene Disheveled  Behavior Characteristics Cooperative  Mood Sad  Thought Process  Coherency WDL  Content WDL  Delusions None reported or observed  Perception WDL  Hallucination None reported or observed  Judgment Limited  Confusion None  Danger to Self  Current suicidal ideation? Denies  Self-Injurious Behavior No self-injurious ideation or behavior indicators observed or expressed   Agreement Not to Harm Self Yes  Description of Agreement Verbal  Danger to Others  Danger to Others None reported or observed  Danger to Others Abnormal  Harmful Behavior to others No threats or harm toward other people  Destructive Behavior No threats or harm toward property

## 2023-08-02 NOTE — [facility] Group Notes (Signed)
Adult Psychoeducational Group Note  Date:  08/02/2023 Time:  9:08 PM  Group Topic/Focus:  Wrap-Up Group:   The focus of this group is to help patients review their daily goal of treatment and discuss progress on daily workbooks.  Participation Level:  Active  Participation Quality:  Appropriate  Affect:  Appropriate  Cognitive:  Appropriate  Insight: Appropriate  Engagement in Group:  Engaged  Modes of Intervention:  Discussion  Additional Comments:  Remona said her day was a 10. She had a meeting with mothr . This was a good meeting. She going to a grou home or with family and she's happy.  Charna Busman Long 08/02/2023, 9:08 PM

## 2023-08-02 NOTE — Group Note (Signed)
Date:  08/02/2023 Time:  10:19 PM  Group Topic/Focus:  Wrap-Up Group:   The focus of this group is to help patients review their daily goal of treatment and discuss progress on daily workbooks.    Participation Level:  Did Not Attend  Scot Dock 08/02/2023, 10:19 PM

## 2023-08-02 NOTE — BH IP Treatment Plan (Signed)
Interdisciplinary Treatment and Diagnostic Plan Update  08/02/2023 Time of Session: 11:05 AM - UPDATE Yolanda [name] MRN: [id number]  Principal Diagnosis: Schizoaffective disorder, depressive type (HCC)  Secondary Diagnoses: Principal Problem:   Schizoaffective disorder, depressive type (HCC) Active Problems:   GERD (gastroesophageal reflux disease)   Intellectual disability   Tobacco use disorder   Current Medications:  Current Facility-Administered Medications  Medication Dose Route Frequency Provider Last Rate Last Admin   acetaminophen (TYLENOL) tablet 650 mg  650 mg Oral Q6H PRN [name], NP   650 mg at 08/01/23 2033   alum & mag hydroxide-simeth (MAALOX/MYLANTA) 200-200-20 MG/5ML suspension 30 mL  30 mL Oral Q4H PRN Onuoha, Chinwendu V, NP   30 mL at 05/31/23 0801   atorvastatin (LIPITOR) tablet 10 mg  10 mg Oral Daily [name], NP   10 mg at 08/01/23 1655   busPIRone (BUSPAR) tablet 15 mg  15 mg Oral BID [name], NP   15 mg at 08/02/23 0940   cyanocobalamin (VITAMIN B12) injection 1,000 mcg  1,000 mcg Intramuscular Q30 days Sarita Bottom, MD   1,000 mcg at 08/01/23 1235   diphenhydrAMINE (BENADRYL) capsule 50 mg  50 mg Oral TID PRN [name], NP   50 mg at 06/03/23 1110   Or   diphenhydrAMINE (BENADRYL) injection 50 mg  50 mg Intramuscular TID PRN [name], NP       docusate sodium (COLACE) capsule 100 mg  100 mg Oral Daily Nkwenti, Doris, NP   100 mg at 08/02/23 0940   haloperidol (HALDOL) tablet 5 mg  5 mg Oral TID PRN [name], NP   5 mg at 06/03/23 1110   Or   haloperidol lactate (HALDOL) injection 5 mg  5 mg Intramuscular TID PRN [name], NP       hydrocortisone cream 1 %   Topical BID Cecilie Lowers, FNP   Given at 07/27/23 1008   hydrOXYzine (ATARAX) tablet 25 mg  25 mg Oral TID PRN [name], DO   25 mg at 08/01/23 2033   [START ON 08/03/2023] levothyroxine (SYNTHROID) tablet 75 mcg  75 mcg Oral Q0600 Golda Acre, MD        LORazepam (ATIVAN) tablet 2 mg  2 mg Oral TID PRN [name], NP   2 mg at 06/03/23 1110   Or   LORazepam (ATIVAN) injection 2 mg  2 mg Intramuscular TID PRN [name], NP       magnesium hydroxide (MILK OF MAGNESIA) suspension 30 mL  30 mL Oral Daily PRN [name], NP   30 mL at 07/18/23 1701   melatonin tablet 5 mg  5 mg Oral QHS Nkwenti, Doris, NP   5 mg at 08/01/23 2033   nicotine polacrilex (NICORETTE) gum 2 mg  2 mg Oral PRN [name], MD   2 mg at 07/31/23 1416   paliperidone (INVEGA) 24 hr tablet 6 mg  6 mg Oral Daily [name], NP   6 mg at 08/01/23 2033   pantoprazole (PROTONIX) EC tablet 40 mg  40 mg Oral Daily Nwoko, Nicole Kindred I, NP   40 mg at 08/02/23 0940   polyethylene glycol (MIRALAX / GLYCOLAX) packet 17 g  17 g Oral Daily PRN [name], MD   17 g at 06/07/23 1847   selenium sulfide (SELSUN) 1 % shampoo   Topical Daily PRN Georgiann Cocker, MD   Given at 07/20/23 2218   sertraline (ZOLOFT) tablet  200 mg  200 mg Oral Daily Golda Acre, MD   200 mg at 08/02/23 0940   SUMAtriptan (IMITREX) tablet 25 mg  25 mg Oral BID PRN [name], NP   25 mg at 07/23/23 1828   traZODone (DESYREL) tablet 50 mg  50 mg Oral QHS [name], NP   50 mg at 08/01/23 2033   Vitamin D (Ergocalciferol) (DRISDOL) 1.25 MG (50000 UNIT) capsule 50,000 Units  50,000 Units Oral Q7 days [name], NP   50,000 Units at 07/27/23 1434   PTA Medications: Medications Prior to Admission  Medication Sig Dispense Refill Last Dose/Taking   busPIRone (BUSPAR) 15 MG tablet Take 15 mg by mouth 2 (two) times daily. (Patient not taking: Reported on 12/19/2022)      paliperidone (INVEGA SUSTENNA) 156 MG/ML SUSY injection Inject 156 mg into the muscle once. (Patient not taking: Reported on 12/19/2022)      sertraline (ZOLOFT) 50 MG tablet Take 150 mg by mouth daily. (Patient not taking: Reported on 12/19/2022)      traZODone (DESYREL) 100 MG tablet Take 100 mg by mouth at bedtime as  needed for sleep. (Patient not taking: Reported on 11/12/2022)       Patient Stressors: Medication change or noncompliance    Patient Strengths: Forensic psychologist fund of knowledge   Treatment Modalities: Medication Management, Group therapy, Case management,  1 to 1 session with clinician, Psychoeducation, Recreational therapy.   Physician Treatment Plan for Primary Diagnosis: Schizoaffective disorder, depressive type (HCC) Long Term Goal(s): Improvement in symptoms so as ready for discharge   Short Term Goals: Ability to identify changes in lifestyle to reduce recurrence of condition will improve Ability to verbalize feelings will improve  Medication Management: Evaluate patient's response, side effects, and tolerance of medication regimen.  Therapeutic Interventions: 1 to 1 sessions, Unit Group sessions and Medication administration.  Evaluation of Outcomes: Progressing  Physician Treatment Plan for Secondary Diagnosis: Principal Problem:   Schizoaffective disorder, depressive type (HCC) Active Problems:   GERD (gastroesophageal reflux disease)   Intellectual disability   Tobacco use disorder  Long Term Goal(s): Improvement in symptoms so as ready for discharge   Short Term Goals: Ability to identify changes in lifestyle to reduce recurrence of condition will improve Ability to verbalize feelings will improve     Medication Management: Evaluate patient's response, side effects, and tolerance of medication regimen.  Therapeutic Interventions: 1 to 1 sessions, Unit Group sessions and Medication administration.  Evaluation of Outcomes: Progressing   RN Treatment Plan for Primary Diagnosis: Schizoaffective disorder, depressive type (HCC) Long Term Goal(s): Knowledge of disease and therapeutic regimen to maintain health will improve  Short Term Goals: Ability to remain free from injury will improve, Ability to verbalize frustration and anger appropriately will  improve, Ability to participate in decision making will improve, Ability to verbalize feelings will improve, Ability to identify and develop effective coping behaviors will improve, and Compliance with prescribed medications will improve  Medication Management: RN will administer medications as ordered by provider, will assess and evaluate patient's response and provide education to patient for prescribed medication. RN will report any adverse and/or side effects to prescribing provider.  Therapeutic Interventions: 1 on 1 counseling sessions, Psychoeducation, Medication administration, Evaluate responses to treatment, Monitor vital signs and CBGs as ordered, Perform/monitor CIWA, COWS, AIMS and Fall Risk screenings as ordered, Perform wound care treatments as ordered.  Evaluation of Outcomes: Progressing   LCSW Treatment Plan for Primary Diagnosis: Schizoaffective disorder, depressive  type [facility]) Long Term Goal(s): Safe transition to appropriate next level of care at discharge, Engage patient in therapeutic group addressing interpersonal concerns.  Short Term Goals: Engage patient in aftercare planning with referrals and resources, Increase ability to appropriately verbalize feelings, Facilitate acceptance of mental health diagnosis and concerns, and Identify triggers associated with mental health/substance abuse issues  Therapeutic Interventions: Assess for all discharge needs, 1 to 1 time with Social worker, Explore available resources and support systems, Assess for adequacy in community support network, Educate family and significant other(s) on suicide prevention, Complete Psychosocial Assessment, Interpersonal group therapy.  Evaluation of Outcomes: Progressing   Attending groups: attended some groups Participating in groups: attended some groups Taking medication as prescribed: Yes. Toleration medication: Yes. Family/Significant other contact made: Yes, individual(s) contacted:  Diyana Starrett [phone] Patient understands diagnosis: Yes. Discussing patient identified problems/goals with staff: Yes. Medical problems stabilized or resolved: Yes. Denies suicidal/homicidal ideation: Yes. Issues/concerns per patient self-inventory: No.   New problem(s) identified: none reported   New Short Term/Long Term Goal(s): medication stabilization, elimination of SI thoughts, development of comprehensive mental wellness plan.      Patient Goals:  " placement"   Discharge Plan or Barriers: Patient recently admitted. CSW will continue to follow and assess for appropriate referrals and possible discharge planning.      Reason for Continuation of Hospitalization: Depression Suicidal ideation   Estimated Length of Stay: Pt still expected to discharge  to group home awaiting Single Case agreement and enhanced rate from [facility], otherwise patient will discharge home with mother and have [facility] (was picked up by [facility], will follow at discharge)  Last 3 [place] Suicide Severity Risk Score: Flowsheet Row Ad[facility] (Current) [facility] from 12/19/2022 in [facility] ED from [facility]12/2022 [facility]  C-SSRS RISK CATEGORY High Risk High Risk Moderate Risk       Last [facility] 2/9 Scores:     No data to display          Scribe for Treatment Team: Alla Feeling, LCSWA 08/02/2023 4:14 PM

## 2023-08-03 DIAGNOSIS — F251 Schizoaffective disorder, depressive type: Secondary | ICD-10-CM | POA: Diagnosis not present

## 2023-08-03 LAB — URINE CULTURE: Culture: <10000 — AB

## 2023-08-03 NOTE — Progress Notes (Signed)
Pt did not meet goal.

## 2023-08-03 NOTE — Group Note (Signed)
Date:  08/03/2023 Time:  2:02 PM  Group Topic/Focus:  Dimensions of Wellness:   The focus of this group is to introduce the topic of wellness and discuss the role each dimension of wellness plays in total health.   Participation Level:  Did Not Attend  Participation Quality:   n/a  Affect:   n/a  Cognitive:   n/a  Insight: None  Engagement in Group:   n/a  Modes of Intervention:   n/a  Additional Comments:   Pt did not attend.   Edmund Hilda Gisel Vipond 08/03/2023, 2:02 PM

## 2023-08-03 NOTE — Group Note (Signed)
Date:  08/03/2023 Time:  1:20 PM  Group Topic/Focus:  Goals Group:   The focus of this group is to help patients establish daily goals to achieve during treatment and discuss how the patient can incorporate goal setting into their daily lives to aide in recovery. Orientation:   The focus of this group is to educate the patient on the purpose and policies of crisis stabilization and provide a format to answer questions about their admission.  The group details unit policies and expectations of patients while admitted.    Participation Level:  Did Not Attend  Participation Quality:   n/a  Affect:   n/a  Cognitive:   n/a  Insight: None  Engagement in Group:   n/a  Modes of Intervention:   n/a  Additional Comments:   Pt did not attend.   Yolanda [name] 08/03/2023, 1:20 PM

## 2023-08-03 NOTE — Progress Notes (Signed)
   08/02/23 2000  Psych Admission Type (Psych Patients Only)  Admission Status Involuntary  Psychosocial Assessment  Patient Complaints Anxiety  Eye Contact Fair  Facial Expression Sad;Flat  Affect Depressed  Speech Soft  Interaction Childlike;Needy  Motor Activity Slow  Appearance/Hygiene Disheveled  Behavior Characteristics Cooperative  Mood Sad  Thought Process  Coherency WDL  Content WDL  Delusions None reported or observed  Perception WDL  Hallucination None reported or observed  Judgment Limited  Confusion None  Danger to Self  Current suicidal ideation? Denies  Self-Injurious Behavior No self-injurious ideation or behavior indicators observed or expressed   Agreement Not to Harm Self Yes  Description of Agreement Verbal  Danger to Others  Danger to Others None reported or observed  Danger to Others Abnormal  Harmful Behavior to others No threats or harm toward other people  Destructive Behavior No threats or harm toward property

## 2023-08-03 NOTE — Plan of Care (Signed)
  Problem: Coping: Goal: Coping ability will improve Outcome: Progressing   Problem: Medication: Goal: Compliance with prescribed medication regimen will improve Outcome: Progressing   Problem: Activity: Goal: Sleeping patterns will improve Outcome: Progressing   Problem: Safety: Goal: Periods of time without injury will increase Outcome: Progressing

## 2023-08-03 NOTE — Progress Notes (Signed)
   08/03/23 0850  Psych Admission Type (Psych Patients Only)  Admission Status Involuntary  Psychosocial Assessment  Patient Complaints Anxiety;Depression  Eye Contact Fair  Facial Expression Flat;Sad  Affect Anxious;Depressed  Speech Soft  Interaction Childlike  Motor Activity Slow  Appearance/Hygiene Disheveled  Behavior Characteristics Cooperative  Mood Sad;Anxious  Thought Process  Coherency WDL  Content WDL  Delusions None reported or observed  Perception WDL  Hallucination None reported or observed  Judgment Limited  Confusion None  Danger to Self  Current suicidal ideation? Denies  Self-Injurious Behavior No self-injurious ideation or behavior indicators observed or expressed   Agreement Not to Harm Self Yes  Description of Agreement Verbal  Danger to Others  Danger to Others None reported or observed  Danger to Others Abnormal  Harmful Behavior to others No threats or harm toward other people  Destructive Behavior No threats or harm toward property

## 2023-08-03 NOTE — Progress Notes (Signed)
[facility] MD Progress Note  08/03/2023 10:47 AM Yolanda [name] Yolanda [name]  MRN:  [id number]  Subjective:   48 year old African-American female, single, on SSI disability, homeless.  Background history of intellectual disability, substance use disorder and schizoaffective disorder depressive type.  Presented via emergency services on account of worsening depression associated with suicidal thoughts.  Expressed thoughts of suicide by stabbing herself.  Chart reviewed today. Patient discussed at multidisciplinary team meeting. Staff reports that she slept well last night. No acute changes in her mental state. No PRNs required.  Daily notes:  During today's rounds, the patient was observed lying in bed asleep.  On today's assessment, she described her mood as "it's okay." She denied both anxiety and depression. Sleep is reported as satisfactory. Her appetite remains normal, and she reports eating at least 2 meals per day as she is not a big breakfast eater.    The patient reported attending and actively participating in group sessions yesterday. She appeared calm and cooperative, and demonstrated improved insight. She denied having any suicidal thoughts, plans, or intent, as well as any homicidal ideation. Additionally, she denies auditory or visual hallucinations, thought insertion, or paranoia.  Communication during the encounter was clear, with no signs distractibility or preoccupation. Patient reported that her current medication regimen is effectively managing her psychiatric symptoms without causing any side effects.    No signs of TD (Tardive Dyskinesia) or EPS (Extrapyramidal Symptoms) were observed during the assessment, and the patient reports no feelings of stiffness. AIMS score: 0.  According to social work, the patient has 2 potential discharge options for Monday, January 27: Placement in a group home or discharged to her mother's home with appropriate support in place.  The patient expressed enthusiasm  about the possibility of being discharged on Monday.     Principal Problem: Schizoaffective disorder, depressive type (HCC) Diagnosis: Principal Problem:   Schizoaffective disorder, depressive type (HCC) Active Problems:   GERD (gastroesophageal reflux disease)   Intellectual disability   Tobacco use disorder  Total Time spent with patient: 30 minutes  Past Psychiatric History: Extensive history of mental illness.  She has been tried on multiple medications over the years.  See H&P for full details.  Past Medical History:  Past Medical History:  Diagnosis Date   Bipolar affect, depressed (HCC)    Constipation 08/17/2022   Depression    Falls 07/21/2022   Fracture of femoral neck, right, closed (HCC) 01/17/2022   Herpes simplex 08/22/2017   Intellectual disability    Open fracture dislocation of right elbow joint 01/17/2022    Past Surgical History:  Procedure Laterality Date   NO PAST SURGERIES     SALPINGECTOMY     Family History: History reviewed. No pertinent family history. Family Psychiatric  History: Extensive family history of mood disorder. Social History:  Social History   Substance and Sexual Activity  Alcohol Use Yes     Social History   Substance and Sexual Activity  Drug Use Yes   Types: Cocaine, Marijuana    Social History   Socioeconomic History   Marital status: Single    Spouse name: Not on file   Number of children: Not on file   Years of education: Not on file   Highest education level: Not on file  Occupational History   Not on file  Tobacco Use   Smoking status: Every Day   Smokeless tobacco: Not on file  Substance and Sexual Activity   Alcohol use: Yes   Drug use: Yes  Types: Cocaine, Marijuana   Sexual activity: Yes  Other Topics Concern   Not on file  Social History Narrative   Not on file   Social Drivers of Health   Financial Resource Strain: Low Risk  (09/12/2022)   Received from [facility], [facility]    Overall Financial Resource Strain (CARDIA)    Difficulty of Paying Living Expenses: Not hard at all  Food Insecurity: Patient Declined (12/20/2022)   Hunger Vital Sign    Worried About Running Out of Food in the Last Year: Patient declined    Ran Out of Food in the Last Year: Patient declined  Transportation Needs: No Transportation Needs (12/20/2022)   PRAPARE - Administrator, Civil Service (Medical): No    Lack of Transportation (Non-Medical): No  Physical Activity: Not on file  Stress: No Stress Concern Present (07/17/2022)   Received from [facility] - Occupational Stress Questionnaire    Feeling of Stress : Not at all  Social Connections: Unknown (07/16/2022)   Received from [facility], [facility]   Social Network    Social Network: Not on file     Current Medications: Current Facility-Administered Medications  Medication Dose Route Frequency Provider Last Rate Last Admin   acetaminophen (TYLENOL) tablet 650 mg  650 mg Oral Q6H PRN [name], NP   650 mg at 08/02/23 2128   alum & mag hydroxide-simeth (MAALOX/MYLANTA) 200-200-20 MG/5ML suspension 30 mL  30 mL Oral Q4H PRN Onuoha, Chinwendu V, NP   30 mL at 05/31/23 0801   atorvastatin (LIPITOR) tablet 10 mg  10 mg Oral Daily [name], NP   10 mg at 08/02/23 1707   busPIRone (BUSPAR) tablet 15 mg  15 mg Oral BID [name], NP   15 mg at 08/03/23 1610   cyanocobalamin (VITAMIN B12) injection 1,000 mcg  1,000 mcg Intramuscular Q30 days Sarita Bottom, MD   1,000 mcg at 08/01/23 1235   diphenhydrAMINE (BENADRYL) capsule 50 mg  50 mg Oral TID PRN [name], NP   50 mg at 06/03/23 1110   Or   diphenhydrAMINE (BENADRYL) injection 50 mg  50 mg Intramuscular TID PRN [name], NP       docusate sodium (COLACE) capsule 100 mg  100 mg Oral Daily Nkwenti, Doris, NP   100 mg at 08/03/23 9604   haloperidol (HALDOL) tablet 5 mg  5 mg Oral TID PRN  [name], NP   5 mg at 06/03/23 1110   Or   haloperidol lactate (HALDOL) injection 5 mg  5 mg Intramuscular TID PRN [name], NP       hydrocortisone cream 1 %   Topical BID Cecilie Lowers, FNP   Given at 08/03/23 0804   hydrOXYzine (ATARAX) tablet 25 mg  25 mg Oral TID PRN [name], DO   25 mg at 08/02/23 2128   levothyroxine (SYNTHROID) tablet 75 mcg  75 mcg Oral Q0600 Golda Acre, MD   75 mcg at 08/03/23 5409   LORazepam (ATIVAN) tablet 2 mg  2 mg Oral TID PRN [name], NP   2 mg at 06/03/23 1110   Or   LORazepam (ATIVAN) injection 2 mg  2 mg Intramuscular TID PRN [name], NP       magnesium hydroxide (MILK OF MAGNESIA) suspension 30 mL  30 mL Oral Daily PRN [name], NP   30 mL at 07/18/23  1701   melatonin tablet 5 mg  5 mg Oral QHS Nkwenti, Doris, NP   5 mg at 08/02/23 2128   nicotine polacrilex (NICORETTE) gum 2 mg  2 mg Oral PRN [name], MD   2 mg at 07/31/23 1416   paliperidone (INVEGA) 24 hr tablet 6 mg  6 mg Oral Daily Nkwenti, Doris, NP   6 mg at 08/02/23 2128   pantoprazole (PROTONIX) EC tablet 40 mg  40 mg Oral Daily Nwoko, Nicole Kindred I, NP   40 mg at 08/03/23 0811   polyethylene glycol (MIRALAX / GLYCOLAX) packet 17 g  17 g Oral Daily PRN [name], MD   17 g at 06/07/23 1847   selenium sulfide (SELSUN) 1 % shampoo   Topical Daily PRN Georgiann Cocker, MD   Given at 07/20/23 2218   sertraline (ZOLOFT) tablet 200 mg  200 mg Oral Daily Golda Acre, MD   200 mg at 08/03/23 1610   SUMAtriptan (IMITREX) tablet 25 mg  25 mg Oral BID PRN [name], NP   25 mg at 07/23/23 1828   traZODone (DESYREL) tablet 50 mg  50 mg Oral QHS [name], NP   50 mg at 08/02/23 2128   Vitamin D (Ergocalciferol) (DRISDOL) 1.25 MG (50000 UNIT) capsule 50,000 Units  50,000 Units Oral Q7 days [name], NP   50,000 Units at 07/27/23 1434    Lab Results:  Results for orders placed or performed during the hospital encounter of 12/20/22 (from  the past 48 hours)  TSH     Status: Abnormal   Collection Time: 08/01/23  6:16 PM  Result Value Ref Range   TSH 4.696 (H) 0.350 - 4.500 uIU/mL    Comment: Performed by a 3rd Generation assay with a functional sensitivity of <=0.01 uIU/mL. Performed at [facility], [address] [place], [place] [address]   T3, free     Status: Abnormal   Collection Time: 08/01/23  6:16 PM  Result Value Ref Range   T3, Free 1.7 (L) 2.0 - 4.4 pg/mL    Comment: (NOTE) Performed At: [facility] [address] [place], [place] [id number] Jolene Schimke MD NW:[id number]   T4, free     Status: Abnormal   Collection Time: 08/01/23  6:16 PM  Result Value Ref Range   Free T4 0.55 (L) 0.61 - 1.12 ng/dL    Comment: (NOTE) Biotin ingestion may interfere with free T4 tests. If the results are inconsistent with the TSH level, previous test results, or the clinical presentation, then consider biotin interference. If needed, order repeat testing after stopping biotin. Performed at [facility], [address] [place], [place] [address]   Lipid panel     Status: Abnormal   Collection Time: 08/01/23  6:16 PM  Result Value Ref Range   Cholesterol 166 0 - 200 mg/dL   Triglycerides 962 (H) <150 mg/dL   HDL 59 >95 mg/dL   Total CHOL/HDL Ratio 2.8 RATIO   VLDL 33 0 - 40 mg/dL   LDL Cholesterol 74 0 - 99 mg/dL    Comment:        Total Cholesterol/HDL:CHD Risk Coronary Heart Disease Risk Table                     Men   Women  1/2 Average Risk   3.4   3.3  Average Risk       5.0   4.4  2 X  Average Risk   9.6   7.1  3 X Average Risk  23.4   11.0        Use the calculated Patient Ratio above and the CHD Risk Table to determine the patient's CHD Risk.        ATP III CLASSIFICATION (LDL):  <100     mg/dL   Optimal  086-578  mg/dL   Near or Above                    Optimal  130-159  mg/dL   Borderline  469-629  mg/dL   High  >528     mg/dL   Very High Performed at  [facility], [address], [place], [place] [address]     Blood Alcohol level:  Lab Results  Component Value Date   ETH <10 12/19/2022   ETH <10 11/21/2019    Metabolic Disorder Labs: Lab Results  Component Value Date   HGBA1C 5.6 06/16/2023   MPG 114.02 06/16/2023   MPG 79.58 12/21/2022   No results found for: "PROLACTIN" Lab Results  Component Value Date   CHOL 166 08/01/2023   TRIG 165 (H) 08/01/2023   HDL 59 08/01/2023   CHOLHDL 2.8 08/01/2023   VLDL 33 08/01/2023   LDLCALC 74 08/01/2023   LDLCALC 128 (H) 06/16/2023    Physical Findings: AIMS: Facial and Oral Movements Muscles of Facial Expression: None Lips and Perioral Area: None Jaw: None Tongue: None,Extremity Movements Upper (arms, wrists, hands, fingers): None Lower (legs, knees, ankles, toes): None, Trunk Movements Neck, shoulders, hips: None, Global Judgements Severity of abnormal movements overall : None Incapacitation due to abnormal movements: None Patient's awareness of abnormal movements: No Awareness, Dental Status Current problems with teeth and/or dentures?: No Does patient usually wear dentures?: No  CIWA:    COWS:     Musculoskeletal: Strength & Muscle Tone: within normal limits Gait & Station: normal Patient leans: N/A  Psychiatric Specialty Exam:  Mental Status Exam: Appearance & Hygiene:Normal Eye Contact: Good Attitude: Calm and cooperative Mood: "sad" Affect: Congruent Speech:  Normal rate, amount, tone, and volume Thought Process: Linear and logical Perception (Hallucinations): No abnormal perceptions appreciated Thought Content: Denies suicidal or homicidal ideations Insight: Fair Judgement: Fair Cognition: Intact Orientation: A&O x 4 Memory: Short Term, grossly intact  Attention & Concentration: Grossly Intact Language: WNL Abstraction: Intact General Fund of Knowledge: Intact General Cognition: Intact Other: NA    Physical  Exam: General: Sitting comfortably. NAD. HEENT: Normocephalic, atraumatic, MMM, EMOI Lungs: no increased work of breathing noted Heart: no cyanosis Abdomen: Non distended Musculoskeletal: FROM. No obvious deformities Skin: Warm, dry, intact. No rashes noted Neuro: No obvious focal deficits.  Gait and station are normal  Review of Systems  Constitutional: Negative.   HENT: Negative.    Eyes: Negative.   Respiratory: Negative.    Cardiovascular: Negative.   Gastrointestinal: Negative.   Genitourinary: Negative.   Skin: Negative.   Neurological: Negative.   Psychiatric/Behavioral:  Negative.    Blood pressure 109/70, pulse 65, temperature 98 F (36.7 C), temperature source Oral, resp. rate 18, height 4\' 11"  (1.499 m), weight 63 kg, SpO2 97%. Body mass index is 28.05 kg/m.   Treatment Plan Summary: Patient is at her baseline.  She is tolerating her medications well.  There is no dangerousness.  She is stable for care as soon as we finalize safe an appropriate disposition.  We will continue her medications and evaluate her further.  1.  Continue Invega ER 6 mg at bedtime. 2.  Increase sertraline to 200 mg daily. 3.  Continue trazodone 50 mg as needed for insomnia. 4.  Continue to monitor mood behavior and interaction with others. 5.  Continue to encourage unit groups and therapeutic activities. 6.  Continue Synthroid 50 mcg for hypothyroidism 7.  Recheck lipid panel and thyroid to see if labs have normalized. 8.  Check urinalysis due to increased sadness, lethargy, and behavioral change   Norma Fredrickson, NP 08/03/2023, 10:47 AM Patient ID: Yolanda [name], female   DOB: 17-Sep-1975, 48 y.o.   MRN: [id number] Patient ID: Yolanda [name], female   DOB: 1976-03-08, 48 y.o.   MRN: [id number]

## 2023-08-04 DIAGNOSIS — F251 Schizoaffective disorder, depressive type: Secondary | ICD-10-CM | POA: Diagnosis not present

## 2023-08-04 NOTE — [facility] Group Notes (Signed)
Pt was encouraged but refused to attend wrap up group

## 2023-08-04 NOTE — [facility] Group Notes (Signed)
[facility] LCSW Group Therapy Note  Date/Time:  08/04/2023/ 10:15-11:00  Type of Therapy and Topic:  Group Therapy:  Trust & Honesty   Participation Level:  Did Not Attend   Description of Group In this group patient will be asked to explore value of being honest. Patient will be guided to discuss their thoughts, feelings, and behaviors related to honesty and trusting in others. Patient will process together how trust and honesty relate to how we form relationships with peers, family member, and self. Each patient will be challenged to identify and express feelings of being vulnerable. Patient will discuss reasons why people are dishonest and identify alternative outcomes if one was truthful to self or others). This group will be process-oriented, with patients participating in explorations of their own experiences as well as giving and receiving support and challenged from other groups members.   Therapeutic Goals Patient will identify why honesty is important to relationships and how honesty overall affects relationships. Patient will identify a situation where they lied or were lied too and the feelings, thought process, and behaviors surrounding the situation. Patient will identify the meaning of being vulnerable, how that feels, and how that correlate to being honest with self and others. Patient will identify situations where they could have told the truth, but instead lied and explain reasons of dishonesty.   Summary of Patient Progress:  NA    Therapeutic Modalities Cognitive Behavioral Therapy Motivational Interviewing

## 2023-08-04 NOTE — Progress Notes (Addendum)
D: Patient's self inventory sheet. Good sleep, reported sleep medication was not helpful. Good appetite, normal energy levels, good concentration. Rated depression and anxiety 4 and hopelessness 5. Denies withdrawals. Complains of headache and blurred vision. Pain of the wrist 5/10 that is helped with pain medication. Goal for the day is to go to dinner.  A: Medications administered per MD orders. Emotional support and encouragement given to the patient.  R: Denies SI, HI, and AVH. Contracted for safety. Safety maintained with 15 minute checks.

## 2023-08-04 NOTE — Plan of Care (Signed)
Nurse discussed coping skills with patient.

## 2023-08-04 NOTE — Progress Notes (Signed)
   08/03/23 2100  Psych Admission Type (Psych Patients Only)  Admission Status Involuntary  Psychosocial Assessment  Patient Complaints Anxiety;Depression (Anxiety 10/10, Depression 10/10)  Eye Contact Fair  Facial Expression Sad;Flat  Affect Depressed  Speech Soft  Interaction Childlike;Needy  Motor Activity Slow  Appearance/Hygiene Disheveled  Behavior Characteristics Cooperative  Mood Sad  Thought Process  Coherency WDL  Content WDL  Delusions None reported or observed  Perception WDL  Hallucination None reported or observed  Judgment Limited  Confusion None  Danger to Self  Current suicidal ideation? Denies  Self-Injurious Behavior No self-injurious ideation or behavior indicators observed or expressed   Agreement Not to Harm Self Yes  Description of Agreement Verbal  Danger to Others  Danger to Others None reported or observed  Danger to Others Abnormal  Harmful Behavior to others No threats or harm toward other people  Destructive Behavior No threats or harm toward property

## 2023-08-04 NOTE — Progress Notes (Signed)
[facility] MD Progress Note  08/04/2023 8:40 AM Yolanda [name]  MRN:  [id number]  Subjective:   48 year old African-American female, single, on SSI disability, homeless.  Background history of intellectual disability, substance use disorder and schizoaffective disorder depressive type.  Presented via emergency services on account of worsening depression associated with suicidal thoughts.  Expressed thoughts of suicide by stabbing herself.   Chart Review from last 24 hours:  Chart reviewed today. Patient discussed at multidisciplinary team meeting. Staff reports that she slept well last night. No acute changes in her mental state. No PRNs required.   Daily notes:  During today's rounds, the patient was seen in her room, lying in bed asleep. Upon awakening, she denies both anxiety and depression. Sleep and appetite are reported as satisfactory. The patient denies suicidal or homicidal ideation, as well as auditory or visual hallucinations, paranoia, or delusional thought content. She reports good concentration and states that she continues to attend groups in the unit, finding them beneficial.   Objectively,  she appears to be in no acute distress. She is alert and oriented x 4; calm, cooperative, and mood congruent with affect. She is speaking in a clear tone and moderate in volume, and normal pace, with good eye contact. There is no indication that she is currently responding to internal or external stimuli, nor is there evidence of acute psychiatric impairment. She has remained calm throughout assessment and has answered questions appropriately. No signs of TD (Tardive Dyskinesia) or EPS (Extrapyramidal Symptoms) were observed during the assessment, and the patient reports no feelings of stiffness.   The patient's psychiatric symptoms remain stable.  Potential discharge date is Monday, January 27.   Principal Problem: Schizoaffective disorder, depressive type (HCC) Diagnosis: Principal Problem:    Schizoaffective disorder, depressive type (HCC) Active Problems:   GERD (gastroesophageal reflux disease)   Intellectual disability   Tobacco use disorder  Total Time spent with patient: 30 minutes  Past Psychiatric History: Extensive history of mental illness.  She has been tried on multiple medications over the years.  See H&P for full details.  Past Medical History:  Past Medical History:  Diagnosis Date   Bipolar affect, depressed (HCC)    Constipation 08/17/2022   Depression    Falls 07/21/2022   Fracture of femoral neck, right, closed (HCC) 01/17/2022   Herpes simplex 08/22/2017   Intellectual disability    Open fracture dislocation of right elbow joint 01/17/2022    Past Surgical History:  Procedure Laterality Date   NO PAST SURGERIES     SALPINGECTOMY     Family History: History reviewed. No pertinent family history. Family Psychiatric  History: Extensive family history of mood disorder. Social History:  Social History   Substance and Sexual Activity  Alcohol Use Yes     Social History   Substance and Sexual Activity  Drug Use Yes   Types: Cocaine, Marijuana    Social History   Socioeconomic History   Marital status: Single    Spouse name: Not on file   Number of children: Not on file   Years of education: Not on file   Highest education level: Not on file  Occupational History   Not on file  Tobacco Use   Smoking status: Every Day   Smokeless tobacco: Not on file  Substance and Sexual Activity   Alcohol use: Yes   Drug use: Yes    Types: Cocaine, Marijuana   Sexual activity: Yes  Other Topics Concern   Not on file  Social History Narrative   Not on file   Social Drivers of Health   Financial Resource Strain: Low Risk  (09/12/2022)   Received from [facility], [facility]   Overall Financial Resource Strain (CARDIA)    Difficulty of Paying Living Expenses: Not hard at all  Food Insecurity: Patient Declined (12/20/2022)   Hunger Vital  Sign    Worried About Running Out of Food in the Last Year: Patient declined    Ran Out of Food in the Last Year: Patient declined  Transportation Needs: No Transportation Needs (12/20/2022)   PRAPARE - Administrator, Civil Service (Medical): No    Lack of Transportation (Non-Medical): No  Physical Activity: Not on file  Stress: No Stress Concern Present (07/17/2022)   Received from [facility], [facility] - Occupational Stress Questionnaire    Feeling of Stress : Not at all  Social Connections: Unknown (07/16/2022)   Received from [facility], [facility]   Social Network    Social Network: Not on file     Current Medications: Current Facility-Administered Medications  Medication Dose Route Frequency Provider Last Rate Last Admin   acetaminophen (TYLENOL) tablet 650 mg  650 mg Oral Q6H PRN [name], NP   650 mg at 08/03/23 2048   alum & mag hydroxide-simeth (MAALOX/MYLANTA) 200-200-20 MG/5ML suspension 30 mL  30 mL Oral Q4H PRN Onuoha, Chinwendu V, NP   30 mL at 05/31/23 0801   atorvastatin (LIPITOR) tablet 10 mg  10 mg Oral Daily [name], NP   10 mg at 08/03/23 1707   busPIRone (BUSPAR) tablet 15 mg  15 mg Oral BID [name], NP   15 mg at 08/03/23 1707   cyanocobalamin (VITAMIN B12) injection 1,000 mcg  1,000 mcg Intramuscular Q30 days Sarita Bottom, MD   1,000 mcg at 08/01/23 1235   diphenhydrAMINE (BENADRYL) capsule 50 mg  50 mg Oral TID PRN [name], NP   50 mg at 06/03/23 1110   Or   diphenhydrAMINE (BENADRYL) injection 50 mg  50 mg Intramuscular TID PRN [name], NP       docusate sodium (COLACE) capsule 100 mg  100 mg Oral Daily Nkwenti, Doris, NP   100 mg at 08/03/23 2956   haloperidol (HALDOL) tablet 5 mg  5 mg Oral TID PRN [name], NP   5 mg at 06/03/23 1110   Or   haloperidol lactate (HALDOL) injection 5 mg  5 mg Intramuscular TID PRN [name], NP       hydrocortisone  cream 1 %   Topical BID Cecilie Lowers, FNP   Given at 08/03/23 0804   hydrOXYzine (ATARAX) tablet 25 mg  25 mg Oral TID PRN [name], DO   25 mg at 08/03/23 2048   levothyroxine (SYNTHROID) tablet 75 mcg  75 mcg Oral Q0600 Golda Acre, MD   75 mcg at 08/04/23 2130   LORazepam (ATIVAN) tablet 2 mg  2 mg Oral TID PRN [name], NP   2 mg at 06/03/23 1110   Or   LORazepam (ATIVAN) injection 2 mg  2 mg Intramuscular TID PRN [name], NP       magnesium hydroxide (MILK OF MAGNESIA) suspension 30 mL  30 mL Oral Daily PRN [name], NP   30 mL at 07/18/23 1701   melatonin tablet 5 mg  5 mg Oral QHS [name], NP   5  mg at 08/03/23 2048   nicotine polacrilex (NICORETTE) gum 2 mg  2 mg Oral PRN [name], MD   2 mg at 08/03/23 1346   paliperidone (INVEGA) 24 hr tablet 6 mg  6 mg Oral Daily Nkwenti, Doris, NP   6 mg at 08/03/23 2048   pantoprazole (PROTONIX) EC tablet 40 mg  40 mg Oral Daily Nwoko, Nicole Kindred I, NP   40 mg at 08/03/23 0811   polyethylene glycol (MIRALAX / GLYCOLAX) packet 17 g  17 g Oral Daily PRN [name], MD   17 g at 06/07/23 1847   selenium sulfide (SELSUN) 1 % shampoo   Topical Daily PRN Georgiann Cocker, MD   Given at 07/20/23 2218   sertraline (ZOLOFT) tablet 200 mg  200 mg Oral Daily Golda Acre, MD   200 mg at 08/03/23 0865   SUMAtriptan (IMITREX) tablet 25 mg  25 mg Oral BID PRN [name], NP   25 mg at 07/23/23 1828   traZODone (DESYREL) tablet 50 mg  50 mg Oral QHS [name], NP   50 mg at 08/03/23 2048   Vitamin D (Ergocalciferol) (DRISDOL) 1.25 MG (50000 UNIT) capsule 50,000 Units  50,000 Units Oral Q7 days [name], NP   50,000 Units at 08/03/23 1553    Lab Results:  No results found for this or any previous visit (from the past 48 hours).   Blood Alcohol level:  Lab Results  Component Value Date   ETH <10 12/19/2022   ETH <10 11/21/2019    Metabolic Disorder Labs: Lab Results  Component Value Date    HGBA1C 5.6 06/16/2023   MPG 114.02 06/16/2023   MPG 79.58 12/21/2022   No results found for: "PROLACTIN" Lab Results  Component Value Date   CHOL 166 08/01/2023   TRIG 165 (H) 08/01/2023   HDL 59 08/01/2023   CHOLHDL 2.8 08/01/2023   VLDL 33 08/01/2023   LDLCALC 74 08/01/2023   LDLCALC 128 (H) 06/16/2023    Physical Findings: AIMS: Facial and Oral Movements Muscles of Facial Expression: None Lips and Perioral Area: None Jaw: None Tongue: None,Extremity Movements Upper (arms, wrists, hands, fingers): None Lower (legs, knees, ankles, toes): None, Trunk Movements Neck, shoulders, hips: None, Global Judgements Severity of abnormal movements overall : None Incapacitation due to abnormal movements: None Patient's awareness of abnormal movements: No Awareness, Dental Status Current problems with teeth and/or dentures?: No Does patient usually wear dentures?: No  CIWA:   N/A COWS:   N/A  Musculoskeletal: Strength & Muscle Tone: within normal limits Gait & Station: normal Patient leans: N/A  Psychiatric Specialty Exam:  Mental Status Exam: Appearance & Hygiene:Normal Eye Contact: Good Attitude: Calm and cooperative Mood: "sad" Affect: Congruent Speech:  Normal rate, amount, tone, and volume Thought Process: Linear and logical Perception (Hallucinations): No abnormal perceptions appreciated Thought Content: Denies suicidal or homicidal ideations Insight: Fair Judgement: Fair Cognition: Intact Orientation: A&O x 4 Memory: Short Term, grossly intact  Attention & Concentration: Grossly Intact Language: WNL Abstraction: Intact General Fund of Knowledge: Intact General Cognition: Intact Other: NA    Physical Exam: General: Sitting comfortably. NAD. HEENT: Normocephalic, atraumatic, MMM, EMOI Lungs: no increased work of breathing noted Heart: no cyanosis Abdomen: Non distended Musculoskeletal: FROM. No obvious deformities Skin: Warm, dry, intact. No rashes  noted Neuro: No obvious focal deficits.  Gait and station are normal  Review of Systems  Constitutional: Negative.   HENT: Negative.    Eyes: Negative.   Respiratory: Negative.  Cardiovascular: Negative.   Gastrointestinal: Negative.   Genitourinary: Negative.   Skin: Negative.   Neurological: Negative.   Psychiatric/Behavioral:  Negative.    Blood pressure 110/77, pulse 80, temperature 98 F (36.7 C), temperature source Oral, resp. rate 18, height 4\' 11"  (1.499 m), weight 63 kg, SpO2 99%. Body mass index is 28.05 kg/m.   Treatment Plan Summary: Patient is at her baseline.  She is tolerating her medications well.  There is no dangerousness.  She is stable for care as soon as we finalize safe an appropriate disposition.  We will continue her medications and evaluate her further.  1.  Continue Invega ER 6 mg at bedtime. 2.  Increase sertraline to 200 mg daily. 3.  Continue trazodone 50 mg as needed for insomnia. 4.  Continue to monitor mood behavior and interaction with others. 5.  Continue to encourage unit groups and therapeutic activities. 6.  Continue Synthroid 50 mcg for hypothyroidism 7.  Recheck lipid panel and thyroid to see if labs have normalized. 8.  Check urinalysis due to increased sadness, lethargy, and behavioral change   Norma Fredrickson, NP 08/04/2023, 8:40 AM Patient ID: Yolanda [name], female   DOB: [birth year], 48 y.o.   MRN: [id number] Patient ID: Yolanda [name], female   DOB: 11-02-1975, 48 y.o.   MRN: [id number] Patient ID: Yolanda [name], female   DOB: 1976-06-07, 48 y.o.   MRN: [id number]

## 2023-08-04 NOTE — Plan of Care (Signed)
  Problem: Education: Goal: Emotional status will improve Outcome: Progressing Goal: Mental status will improve Outcome: Progressing   Problem: Safety: Goal: Periods of time without injury will increase Outcome: Progressing   Problem: Coping: Goal: Coping ability will improve Outcome: Progressing   Problem: Medication: Goal: Compliance with prescribed medication regimen will improve Outcome: Progressing   Problem: Self-Concept: Goal: Ability to disclose and discuss suicidal ideas will improve Outcome: Progressing

## 2023-08-05 DIAGNOSIS — F251 Schizoaffective disorder, depressive type: Secondary | ICD-10-CM | POA: Diagnosis not present

## 2023-08-05 NOTE — Progress Notes (Signed)
   08/04/23 2148  Psych Admission Type (Psych Patients Only)  Admission Status Involuntary  Psychosocial Assessment  Patient Complaints Depression;Anxiety;Sadness  Eye Contact Fair  Facial Expression Flat;Sad  Affect Anxious;Depressed  Speech Soft  Interaction Childlike;Needy  Motor Activity Slow  Appearance/Hygiene Disheveled  Behavior Characteristics Cooperative;Appropriate to situation  Mood Depressed;Sad  Aggressive Behavior  Effect No apparent injury  Thought Process  Coherency WDL  Content WDL  Delusions None reported or observed  Perception WDL  Hallucination None reported or observed  Judgment Limited  Confusion None  Danger to Self  Current suicidal ideation? Denies  Self-Injurious Behavior No self-injurious ideation or behavior indicators observed or expressed   Agreement Not to Harm Self Yes  Description of Agreement verbal  Danger to Others  Danger to Others None reported or observed  Danger to Others Abnormal  Harmful Behavior to others No threats or harm toward other people

## 2023-08-05 NOTE — Discharge Planning (Signed)
No new updates provided by [facility] at this time for group home representative Linden Dolin. She will remain in contact with team regarding updates.   Fernande Boyden, LCSW Clinical Social Worker [facility] Ph: [phone]

## 2023-08-05 NOTE — [facility] Group Notes (Signed)
[facility] Group Notes:  (Nursing/MHT/Case Management/Adjunct)  Date:  08/05/2023  Time:  8:40 PM  Type of Therapy:   AA group  Participation Level:  Active  Participation Quality:  Appropriate  Affect:  Appropriate  Cognitive:  Appropriate  Insight:  Appropriate  Engagement in Group:  Engaged  Modes of Intervention:  Education  Summary of Progress/Problems: Pt attended [employer].  Noah Delaine 08/05/2023, 8:40 PM

## 2023-08-05 NOTE — Progress Notes (Signed)
[facility] MD Progress Note  08/05/2023 4:29 PM Yolanda [name]  MRN:  [id number]  Reason for admission:   48 year old African-American female, single, on SSI disability, homeless.  Background history of intellectual disability, substance use disorder and schizoaffective disorder depressive type.  Presented via emergency services on account of worsening depression associated with suicidal thoughts.  Expressed thoughts of suicide by stabbing herself.  Chart Review from last 24 hours:  Chart reviewed today. Patient discussed at multidisciplinary team meeting. Staff reports that she slept well last night. No acute changes in her mental state. PRNs Tylenol and hydroxyzine administered.   Today's assessment notes:  On assessment today, patient was seen and examined in her room lying on her bed.  She presents alert, cooperative, calm, and oriented to person, place, time, and situation.  Chart reviewed and findings shared with the treatment team and consult with attending psychiatrist.  Patient denies acute discomfort.  Reports her goal today is to continue to read her Bible and be ready to be discharged whenever placement becomes available.  She further denies anxiety and depression and rates both as number is 0/10, with 10 being high severity. Able to maintain good eye contact with this provider and response appropriately with soft voice to assessment questions.  Reports sleeping throughout the night, and appetite is good.  Denies delusional thinking or paranoia. Further denies SI, HI, or AVH. Reports concentration is improving.  Observed attending therapeutic milieu and unit group activities later on in the day. No signs of TD (Tardive Dyskinesia) or EPS (Extrapyramidal Symptoms) were observed during the assessment, and the patient reports no feelings of stiffness.  The patient's psychiatric symptoms remain stable.  Potential discharge date is Monday, January 27.  Principal Problem: Schizoaffective disorder,  depressive type (HCC) Diagnosis: Principal Problem:   Schizoaffective disorder, depressive type (HCC) Active Problems:   GERD (gastroesophageal reflux disease)   Intellectual disability   Tobacco use disorder  Total Time spent with patient: 30 minutes  Past Psychiatric History: Extensive history of mental illness.  She has been tried on multiple medications over the years.  See H&P for full details.  Past Medical History:  Past Medical History:  Diagnosis Date   Bipolar affect, depressed (HCC)    Constipation 08/17/2022   Depression    Falls 07/21/2022   Fracture of femoral neck, right, closed (HCC) 01/17/2022   Herpes simplex 08/22/2017   Intellectual disability    Open fracture dislocation of right elbow joint 01/17/2022    Past Surgical History:  Procedure Laterality Date   NO PAST SURGERIES     SALPINGECTOMY     Family History: History reviewed. No pertinent family history. Family Psychiatric  History: Extensive family history of mood disorder. Social History:  Social History   Substance and Sexual Activity  Alcohol Use Yes     Social History   Substance and Sexual Activity  Drug Use Yes   Types: Cocaine, Marijuana    Social History   Socioeconomic History   Marital status: Single    Spouse name: Not on file   Number of children: Not on file   Years of education: Not on file   Highest education level: Not on file  Occupational History   Not on file  Tobacco Use   Smoking status: Every Day   Smokeless tobacco: Not on file  Substance and Sexual Activity   Alcohol use: Yes   Drug use: Yes    Types: Cocaine, Marijuana   Sexual activity: Yes  Other  Topics Concern   Not on file  Social History Narrative   Not on file   Social Drivers of Health   Financial Resource Strain: Low Risk  (09/12/2022)   Received from [facility], [facility]   Overall Financial Resource Strain (CARDIA)    Difficulty of Paying Living Expenses: Not hard at all  Food  Insecurity: Patient Declined (12/20/2022)   Hunger Vital Sign    Worried About Running Out of Food in the Last Year: Patient declined    Ran Out of Food in the Last Year: Patient declined  Transportation Needs: No Transportation Needs (12/20/2022)   PRAPARE - Administrator, Civil Service (Medical): No    Lack of Transportation (Non-Medical): No  Physical Activity: Not on file  Stress: No Stress Concern Present (07/17/2022)   Received from [facility], [facility] of Occupational Health - Occupational Stress Questionnaire    Feeling of Stress : Not at all  Social Connections: Unknown (07/16/2022)   Received from [facility], [facility]   Social Network    Social Network: Not on file     Current Medications: Current Facility-Administered Medications  Medication Dose Route Frequency Provider Last Rate Last Admin   acetaminophen (TYLENOL) tablet 650 mg  650 mg Oral Q6H PRN [name], NP   650 mg at 08/05/23 1255   alum & mag hydroxide-simeth (MAALOX/MYLANTA) 200-200-20 MG/5ML suspension 30 mL  30 mL Oral Q4H PRN Onuoha, Chinwendu V, NP   30 mL at 05/31/23 0801   atorvastatin (LIPITOR) tablet 10 mg  10 mg Oral Daily [name], NP   10 mg at 08/04/23 1820   busPIRone (BUSPAR) tablet 15 mg  15 mg Oral BID [name], NP   15 mg at 08/05/23 0857   cyanocobalamin (VITAMIN B12) injection 1,000 mcg  1,000 mcg Intramuscular Q30 days Sarita Bottom, MD   1,000 mcg at 08/01/23 1235   diphenhydrAMINE (BENADRYL) capsule 50 mg  50 mg Oral TID PRN [name], NP   50 mg at 06/03/23 1110   Or   diphenhydrAMINE (BENADRYL) injection 50 mg  50 mg Intramuscular TID PRN [name], NP       docusate sodium (COLACE) capsule 100 mg  100 mg Oral Daily Nkwenti, Doris, NP   100 mg at 08/05/23 0857   haloperidol (HALDOL) tablet 5 mg  5 mg Oral TID PRN [name], NP   5 mg at 06/03/23 1110   Or   haloperidol lactate (HALDOL) injection 5 mg  5 mg  Intramuscular TID PRN [name], NP       hydrocortisone cream 1 %   Topical BID Cecilie Lowers, FNP   Given at 08/03/23 0804   hydrOXYzine (ATARAX) tablet 25 mg  25 mg Oral TID PRN [name], DO   25 mg at 08/05/23 1256   levothyroxine (SYNTHROID) tablet 75 mcg  75 mcg Oral Q0600 Golda Acre, MD   75 mcg at 08/05/23 0640   LORazepam (ATIVAN) tablet 2 mg  2 mg Oral TID PRN [name], NP   2 mg at 06/03/23 1110   Or   LORazepam (ATIVAN) injection 2 mg  2 mg Intramuscular TID PRN [name], NP       magnesium hydroxide (MILK OF MAGNESIA) suspension 30 mL  30 mL Oral Daily PRN [name], NP   30 mL at 07/18/23 1701   melatonin tablet 5 mg  5 mg  Oral QHS [name], NP   5 mg at 08/04/23 2112   nicotine polacrilex (NICORETTE) gum 2 mg  2 mg Oral PRN [name], MD   2 mg at 08/03/23 1346   paliperidone (INVEGA) 24 hr tablet 6 mg  6 mg Oral Daily Nkwenti, Doris, NP   6 mg at 08/04/23 2112   pantoprazole (PROTONIX) EC tablet 40 mg  40 mg Oral Daily Nwoko, Nicole Kindred I, NP   40 mg at 08/05/23 0857   polyethylene glycol (MIRALAX / GLYCOLAX) packet 17 g  17 g Oral Daily PRN [name], MD   17 g at 06/07/23 1847   selenium sulfide (SELSUN) 1 % shampoo   Topical Daily PRN Georgiann Cocker, MD   Given at 07/20/23 2218   sertraline (ZOLOFT) tablet 200 mg  200 mg Oral Daily Golda Acre, MD   200 mg at 08/05/23 0857   SUMAtriptan (IMITREX) tablet 25 mg  25 mg Oral BID PRN [name], NP   25 mg at 07/23/23 1828   traZODone (DESYREL) tablet 50 mg  50 mg Oral QHS [name], NP   50 mg at 08/04/23 2112   Vitamin D (Ergocalciferol) (DRISDOL) 1.25 MG (50000 UNIT) capsule 50,000 Units  50,000 Units Oral Q7 days [name], NP   50,000 Units at 08/03/23 1553   Lab Results:  No results found for this or any previous visit (from the past 48 hours).  Blood Alcohol level:  Lab Results  Component Value Date   ETH <10 12/19/2022   ETH <10 11/21/2019   Metabolic  Disorder Labs: Lab Results  Component Value Date   HGBA1C 5.6 06/16/2023   MPG 114.02 06/16/2023   MPG 79.58 12/21/2022   No results found for: "PROLACTIN" Lab Results  Component Value Date   CHOL 166 08/01/2023   TRIG 165 (H) 08/01/2023   HDL 59 08/01/2023   CHOLHDL 2.8 08/01/2023   VLDL 33 08/01/2023   LDLCALC 74 08/01/2023   LDLCALC 128 (H) 06/16/2023   Physical Findings: AIMS: Facial and Oral Movements Muscles of Facial Expression: None Lips and Perioral Area: None Jaw: None Tongue: None,Extremity Movements Upper (arms, wrists, hands, fingers): None Lower (legs, knees, ankles, toes): None, Trunk Movements Neck, shoulders, hips: None, Global Judgements Severity of abnormal movements overall : None Incapacitation due to abnormal movements: None Patient's awareness of abnormal movements: No Awareness, Dental Status Current problems with teeth and/or dentures?: No Does patient usually wear dentures?: No  CIWA:   N/A COWS:   N/A  Musculoskeletal: Strength & Muscle Tone: within normal limits Gait & Station: normal Patient leans: N/A  Psychiatric Specialty Exam:  Mental Status Exam: Appearance & Hygiene:Normal Eye Contact: Good Attitude: Calm and cooperative Mood: "sad" Affect: Congruent Speech:  Normal rate, amount, tone, and volume Thought Process: Linear and logical Perception (Hallucinations): No abnormal perceptions appreciated Thought Content: Denies suicidal or homicidal ideations Insight: Fair Judgement: Fair Cognition: Intact Orientation: A&O x 4 Memory: Short Term, grossly intact  Attention & Concentration: Grossly Intact Language: WNL Abstraction: Intact General Fund of Knowledge: Intact General Cognition: Intact Other: NA  Physical Exam: General: Sitting comfortably. NAD. HEENT: Normocephalic, atraumatic, MMM, EMOI Lungs: no increased work of breathing noted Heart: no cyanosis Abdomen: Non distended Musculoskeletal: FROM. No obvious  deformities Skin: Warm, dry, intact. No rashes noted Neuro: No obvious focal deficits.  Gait and station are normal  Review of Systems  Constitutional: Negative.   HENT: Negative.    Eyes: Negative.  Respiratory: Negative.    Cardiovascular: Negative.   Gastrointestinal: Negative.   Genitourinary: Negative.   Skin: Negative.   Neurological: Negative.   Psychiatric/Behavioral:  Negative.    Blood pressure 133/82, pulse 80, temperature 98 F (36.7 C), temperature source Oral, resp. rate 18, height 4\' 11"  (1.499 m), weight 63 kg, SpO2 99%. Body mass index is 28.05 kg/m.  Treatment Plan Summary: Patient is at her baseline.  She is tolerating her medications well.  There is no dangerousness.  She is stable for care as soon as we finalize safe an appropriate disposition.  We will continue her medications and evaluate her further.  1.  Continue Invega ER 6 mg at bedtime. 2.  Continue sertraline to 200 mg daily. 3.  Continue trazodone 50 mg as needed for insomnia. 4.  Continue to monitor mood behavior and interaction with others. 5.  Continue to encourage unit groups and therapeutic activities. 6.  Continue Synthroid 50 mcg for hypothyroidism 7.  Recheck lipid panel and thyroid to see if labs have normalized. 8.  Check urinalysis due to increased sadness, lethargy, and behavioral change   Cecilie Lowers, FNP 08/05/2023, 4:29 PM Patient ID: Yolanda [name], female   DOB: August 25, 1975, 48 y.o.   MRN: [id number] Patient ID: Yolanda [name], female   DOB: 1975/12/22, 48 y.o.   MRN: [id number] Patient ID: Yolanda [name], female   DOB: 1976/05/18, 48 y.o.   MRN: [id number] Patient ID: Yolanda [name], female   DOB: Sep 11, 1975, 48 y.o.   MRN: [id number]

## 2023-08-05 NOTE — Progress Notes (Signed)
D: Patient is alert, oriented, sad, and cooperative. Denies SI, HI, AVH, and verbally contracts for safety. Patient reports low back pain.   A: Scheduled medications administered per MD order. PRN tylenol and hydroxyzine administered. Support provided. Patient educated on safety on the unit and medications. Routine safety checks every 15 minutes. Patient stated understanding to tell nurse about any new physical symptoms. Patient understands to tell staff of any needs.     R: No adverse drug reactions noted. Patient remains safe at this time and will continue to monitor.    08/05/23 1000  Psych Admission Type (Psych Patients Only)  Admission Status Involuntary  Psychosocial Assessment  Patient Complaints Anxiety;Depression;Sadness  Eye Contact Fair  Facial Expression Flat;Sad  Affect Depressed;Anxious  Speech Soft  Interaction Childlike;Needy  Motor Activity Slow  Appearance/Hygiene Improved  Behavior Characteristics Cooperative;Appropriate to situation  Mood Depressed;Sad  Thought Process  Coherency WDL  Content WDL  Delusions None reported or observed  Perception WDL  Hallucination None reported or observed  Judgment Limited  Confusion None  Danger to Self  Current suicidal ideation? Denies  Self-Injurious Behavior No self-injurious ideation or behavior indicators observed or expressed   Agreement Not to Harm Self Yes  Description of Agreement verbal  Danger to Others  Danger to Others None reported or observed  Danger to Others Abnormal  Harmful Behavior to others No threats or harm toward other people  Destructive Behavior No threats or harm toward property

## 2023-08-05 NOTE — Plan of Care (Signed)
  Problem: Education: Goal: Knowledge of [facility] Education information/materials will improve Outcome: Progressing Goal: Emotional status will improve Outcome: Progressing Goal: Mental status will improve Outcome: Progressing Goal: Verbalization of understanding the information provided will improve Outcome: Progressing   Problem: Coping: Goal: Ability to demonstrate self-control will improve Outcome: Progressing   Problem: Safety: Goal: Periods of time without injury will increase Outcome: Progressing   Problem: Medication: Goal: Compliance with prescribed medication regimen will improve Outcome: Progressing

## 2023-08-05 NOTE — Group Note (Signed)
Recreation Therapy Group Note   Group Topic:Stress Management  Group Date: 08/05/2023 Start Time: 0932 End Time: 1610 Facilitators: Surena Welge-McCall, LRT,CTRS Location: [address] Dayroom   Group Topic: Stress Management  Goal Area(s) Addresses:  Patient will identify positive stress management techniques. Patient will identify benefits of using stress management post d/c.  Intervention: Meditation App   Activity: Meditation. LRT played a meditation that focused on over thinking situations. The meditation focused on how to control anxiety, think positive thoughts and stay in the moment as opposed to thinking ahead about situations beyond your control.     Education:  Stress Management, Discharge Planning.   Education Outcome: Acknowledges Education   Affect/Mood: N/A   Participation Level: Did not attend    Clinical Observations/Individualized Feedback:     Plan: Continue to engage patient in RT group sessions 2-3x/week.   Yolanda [name], LRT,CTRS 08/05/2023 11:55 AM

## 2023-08-06 DIAGNOSIS — F251 Schizoaffective disorder, depressive type: Secondary | ICD-10-CM | POA: Diagnosis not present

## 2023-08-06 NOTE — Group Note (Signed)
Date:  08/06/2023 Time:  4:32 PM  Group Topic/Focus:  Goals Group:   The focus of this group is to help patients establish daily goals to achieve during treatment and discuss how the patient can incorporate goal setting into their daily lives to aide in recovery. Orientation:   The focus of this group is to educate the patient on the purpose and policies of crisis stabilization and provide a format to answer questions about their admission.  The group details unit policies and expectations of patients while admitted.    Participation Level:  Did Not Attend  Participation Quality:   n/a  Affect:   n/a  Cognitive:   n/a  Insight: None  Engagement in Group:   n/a  Modes of Intervention:   n/a  Additional Comments:   Pt did not attend.  Edmund Hilda Hillard Goodwine 08/06/2023, 4:32 PM

## 2023-08-06 NOTE — Group Note (Signed)
LCSW Group Therapy Note   Group Date: 08/06/2023 Start Time: 1100 End Time: 1200   Type of Therapy:  Group Therapy  Topic:  Finding Balance: Using Wise Mind for Thoughtful Decisions  Participation Level:  did not attend  Objective:  the objective of this class is to help participants understand the concept of Wise Mind and learn how to apply it to real-life situations to make balanced, thoughtful decisions. Participants will gain tools to manage emotions, consider logic, and find a middle ground that leads to healthier responses and outcomes.  Goals: Understand the concept of Wise Mind.  Participants will learn the difference between Emotional Mind, Reasonable Mind, and Weston Settle Mind, and how Weston Settle Mind helps in balancing emotions and logic to make thoughtful decisions. Recognize when you're in Emotional Mind or Reasonable Mind.  Participants will identify the signs of Emotional Mind and Reasonable Mind in their own reactions to situations and understand how to move into Wise Mind for more balanced responses. Practice applying Weston Settle Mind to real-life situations.  Through scenarios and group activities, participants will practice using Wise Mind in everyday situations, learning how to acknowledge their emotions, think logically, and create solutions that are thoughtful and balanced.  Summary: In this class, we explored the concept of Wise Mind--the balance between Emotional Mind and Reasonable Mind. We discussed how Emotional Mind can sometimes lead to impulsive, reactive decisions driven by intense feelings, and how Reasonable Mind might ignore feelings altogether, focusing only on facts and logic. Weston Settle Mind is the middle ground that combines both, allowing you to consider your emotions and use logic to make balanced, thoughtful decisions.  We learned how to recognize when we're in Emotional Mind or Reasonable Mind, and practiced using Wise Mind in real-life situations. By using Alfonse Flavors, we can  improve how we handle challenging situations, make better decisions, and strengthen our relationships with others.  Yolanda [name] Jahvon Gosline, LCSWA 08/06/2023  1:27 PM

## 2023-08-06 NOTE — Progress Notes (Signed)
   08/05/23 2051  Psych Admission Type (Psych Patients Only)  Admission Status Involuntary  Psychosocial Assessment  Patient Complaints Depression;Sadness;Hopelessness  Eye Contact Fair  Facial Expression Flat;Sad  Affect Depressed;Anxious  Speech Soft  Interaction Childlike  Motor Activity Slow  Appearance/Hygiene Unremarkable  Behavior Characteristics Cooperative;Appropriate to situation  Mood Depressed;Sad  Thought Process  Coherency WDL  Content WDL  Delusions None reported or observed  Perception WDL  Hallucination None reported or observed  Judgment Limited  Confusion None  Danger to Self  Current suicidal ideation? Denies  Self-Injurious Behavior No self-injurious ideation or behavior indicators observed or expressed   Agreement Not to Harm Self Yes  Description of Agreement verbal  Danger to Others  Danger to Others None reported or observed  Danger to Others Abnormal  Harmful Behavior to others No threats or harm toward other people  Destructive Behavior No threats or harm toward property

## 2023-08-06 NOTE — Progress Notes (Signed)
TOC Supervisor [name] and CSW spoke with [employer] who reported they are still waiting on the contract from [facility].   CSW continues to work with [facility] [phone] for admission into their Psychosocial Rehabilitations  (PSR). [name] mother, [name] scheduled to completed PCP 08/07/23 at 1:00 pm.   Pt has an appt with [facility] on 08/09/23 at 2:30 pm for [employer] services and medication management.   CSW will continue to coordinate pt's disposition.

## 2023-08-06 NOTE — Plan of Care (Signed)
  Problem: Coping: Goal: Coping ability will improve Outcome: Progressing   Problem: Education: Goal: Ability to make informed decisions regarding treatment will improve Outcome: Progressing   Problem: Health Behavior/Discharge Planning: Goal: Identification of resources available to assist in meeting health care needs will improve Outcome: Progressing

## 2023-08-06 NOTE — Group Note (Signed)
Recreation Therapy Group Note   Group Topic:Animal Assisted Therapy   Group Date: 08/06/2023 Start Time: 0944 End Time: 1030 Facilitators: Boni Maclellan-McCall, LRT,CTRS Location: [address] Dayroom   Animal-Assisted Activity (AAA) Program Checklist/Progress Notes Patient Eligibility Criteria Checklist & Daily Group note for Rec Tx Intervention  AAA/T Program Assumption of Risk Form signed by Patient/ or Parent Legal Guardian Yes  Patient understands his/her participation is voluntary Yes  Education: Charity fundraiser, Appropriate Animal Interaction   Education Outcome: Acknowledges education.    Affect/Mood: N/A   Participation Level: Did not attend    Clinical Observations/Individualized Feedback:      Plan: Continue to engage patient in RT group sessions 2-3x/week.   Lizzete Gough-McCall, LRT,CTRS 08/06/2023 1:55 PM

## 2023-08-06 NOTE — [facility] Group Notes (Signed)
[facility] Group Notes:  (Nursing/MHT/Case Management/Adjunct)  Date:  08/06/2023  Time:  2000  Type of Therapy:   wrap up group  Participation Level:  Active  Participation Quality:  Appropriate and Attentive  Affect:  Appropriate  Cognitive:  Alert and Appropriate  Insight:  Appropriate  Engagement in Group:  Engaged  Modes of Intervention:  Discussion  Summary of Progress/Problems:  Fay Records 08/06/2023, 9:39 PM

## 2023-08-06 NOTE — Progress Notes (Signed)
   08/06/23 2100  Psych Admission Type (Psych Patients Only)  Admission Status Involuntary  Psychosocial Assessment  Patient Complaints Depression  Eye Contact Fair  Facial Expression Flat  Affect Anxious  Speech Soft  Interaction Avoidant  Motor Activity Slow  Appearance/Hygiene Poor hygiene  Behavior Characteristics Cooperative  Mood Sad;Depressed  Aggressive Behavior  Effect No apparent injury  Thought Process  Coherency WDL  Content WDL  Delusions None reported or observed  Perception WDL  Hallucination None reported or observed  Judgment Limited  Confusion None  Danger to Self  Current suicidal ideation? Denies  Self-Injurious Behavior No self-injurious ideation or behavior indicators observed or expressed   Agreement Not to Harm Self Yes  Description of Agreement verbal  Danger to Others  Danger to Others None reported or observed  Danger to Others Abnormal  Harmful Behavior to others No threats or harm toward other people  Destructive Behavior No threats or harm toward property

## 2023-08-06 NOTE — Progress Notes (Signed)
[facility] MD Progress Note  08/06/2023 4:43 PM Yolanda [name]  MRN:  [id number] Principal Problem: Schizoaffective disorder, depressive type (HCC) Diagnosis: Principal Problem:   Schizoaffective disorder, depressive type (HCC) Active Problems:   GERD (gastroesophageal reflux disease)   Intellectual disability   Tobacco use disorder  HPI: This is the first psychiatric admission in this [facility] in 49 years for this AA female with an extensive hx of mental illnesses & probable polysubstance use disorders. She is admitted to the [facility] from the [facility] hospital with complain of worsening suicidal ideations with plan to stab herself. Per chart review, patient apparently reported at the ED that she has been depressed for a while & has not been taking her mental health medications. After medical evaluation.clearance, she was transferred to the [facility] for further psychiatric evaluation/treatments.    24 hr chart review: Sleep Hours last night: 6.25 hrs  Nursing Concerns: Isolative to her room.  Behavioral episodes in the past 24 hrs: none reported or noted Medication Compliance: Compliant  Vital Signs in the past 24 hrs:Rn informed to check vitals for today PRN Medications in the past 24 hrs: Tylenol  Patient assessment note: Pt with flat affect and depressed mood, attention to personal hygiene and grooming is fair, eye contact is good, speech is clear & coherent. Thought contents are organized and logical, and pt currently denies SI/HI/AVH or paranoia. There is no evidence of delusional thoughts.   We discussed her current stressor, which is continued hospitalization, empathy and active listening provided.  Patient reports that she is looking forward to being discharged soon.  Remaining frustrated about continued hospitalization.  CSW informed patient's treatment team earlier today that the group home that has accepted her, is awaiting the contract from the state.  Alternative plans are being made to see if  patient can go to her mother's house pending the contract being received by the group home.  CSW to keep treatment team updated.  Total Time spent with patient: 45 minutes  Past Psychiatric History: See H & P  Past Medical History:  Past Medical History:  Diagnosis Date   Bipolar affect, depressed (HCC)    Constipation 08/17/2022   Depression    Falls 07/21/2022   Fracture of femoral neck, right, closed (HCC) 01/17/2022   Herpes simplex 08/22/2017   Intellectual disability    Open fracture dislocation of right elbow joint 01/17/2022    Past Surgical History:  Procedure Laterality Date   NO PAST SURGERIES     SALPINGECTOMY     Family History: History reviewed. No pertinent family history. Family Psychiatric  History: See H & P Social History:  Social History   Substance and Sexual Activity  Alcohol Use Yes     Social History   Substance and Sexual Activity  Drug Use Yes   Types: Cocaine, Marijuana    Social History   Socioeconomic History   Marital status: Single    Spouse name: Not on file   Number of children: Not on file   Years of education: Not on file   Highest education level: Not on file  Occupational History   Not on file  Tobacco Use   Smoking status: Every Day   Smokeless tobacco: Not on file  Substance and Sexual Activity   Alcohol use: Yes   Drug use: Yes    Types: Cocaine, Marijuana   Sexual activity: Yes  Other Topics Concern   Not on file  Social History Narrative   Not on  file   Social Drivers of Health   Financial Resource Strain: Low Risk  (09/12/2022)   Received from [facility], [facility]   Overall Financial Resource Strain (CARDIA)    Difficulty of Paying Living Expenses: Not hard at all  Food Insecurity: Patient Declined (12/20/2022)   Hunger Vital Sign    Worried About Running Out of Food in the Last Year: Patient declined    Ran Out of Food in the Last Year: Patient declined  Transportation Needs: No Transportation Needs  (12/20/2022)   PRAPARE - Administrator, Civil Service (Medical): No    Lack of Transportation (Non-Medical): No  Physical Activity: Not on file  Stress: No Stress Concern Present (07/17/2022)   Received from [facility], [facility] - Occupational Stress Questionnaire    Feeling of Stress : Not at all  Social Connections: Unknown (07/16/2022)   Received from [facility], [facility]   Social Network    Social Network: Not on file   Sleep: Fair  Appetite:  Fair  Current Medications: Current Facility-Administered Medications  Medication Dose Route Frequency Provider Last Rate Last Admin   acetaminophen (TYLENOL) tablet 650 mg  650 mg Oral Q6H PRN [name], NP   650 mg at 08/06/23 1415   alum & mag hydroxide-simeth (MAALOX/MYLANTA) 200-200-20 MG/5ML suspension 30 mL  30 mL Oral Q4H PRN Onuoha, Chinwendu V, NP   30 mL at 05/31/23 0801   atorvastatin (LIPITOR) tablet 10 mg  10 mg Oral Daily [name], NP   10 mg at 08/05/23 1814   busPIRone (BUSPAR) tablet 15 mg  15 mg Oral BID [name], NP   15 mg at 08/06/23 1625   cyanocobalamin (VITAMIN B12) injection 1,000 mcg  1,000 mcg Intramuscular Q30 days Sarita Bottom, MD   1,000 mcg at 08/01/23 1235   diphenhydrAMINE (BENADRYL) capsule 50 mg  50 mg Oral TID PRN [name], NP   50 mg at 06/03/23 1110   Or   diphenhydrAMINE (BENADRYL) injection 50 mg  50 mg Intramuscular TID PRN [name], NP       docusate sodium (COLACE) capsule 100 mg  100 mg Oral Daily Ellieana Dolecki, NP   100 mg at 08/06/23 0758   haloperidol (HALDOL) tablet 5 mg  5 mg Oral TID PRN [name], NP   5 mg at 06/03/23 1110   Or   haloperidol lactate (HALDOL) injection 5 mg  5 mg Intramuscular TID PRN [name], NP       hydrocortisone cream 1 %   Topical BID Ntuen, Jesusita Oka, FNP   1 Application at 08/06/23 1625   hydrOXYzine (ATARAX) tablet 25 mg  25 mg Oral TID PRN [name], DO   25  mg at 08/05/23 1256   levothyroxine (SYNTHROID) tablet 75 mcg  75 mcg Oral Q0600 Golda Acre, MD   75 mcg at 08/06/23 1610   LORazepam (ATIVAN) tablet 2 mg  2 mg Oral TID PRN [name], NP   2 mg at 06/03/23 1110   Or   LORazepam (ATIVAN) injection 2 mg  2 mg Intramuscular TID PRN [name], NP       magnesium hydroxide (MILK OF MAGNESIA) suspension 30 mL  30 mL Oral Daily PRN [name], NP   30 mL at 07/18/23 1701   melatonin tablet 5 mg  5 mg Oral QHS [name], NP   5 mg  at 08/05/23 2127   nicotine polacrilex (NICORETTE) gum 2 mg  2 mg Oral PRN [name], MD   2 mg at 08/06/23 1415   paliperidone (INVEGA) 24 hr tablet 6 mg  6 mg Oral Daily Sixto Bowdish, Tyler Aas, NP   6 mg at 08/05/23 2127   pantoprazole (PROTONIX) EC tablet 40 mg  40 mg Oral Daily Nwoko, Nicole Kindred I, NP   40 mg at 08/06/23 0758   polyethylene glycol (MIRALAX / GLYCOLAX) packet 17 g  17 g Oral Daily PRN [name], MD   17 g at 06/07/23 1847   selenium sulfide (SELSUN) 1 % shampoo   Topical Daily PRN Georgiann Cocker, MD   Given at 07/20/23 2218   sertraline (ZOLOFT) tablet 200 mg  200 mg Oral Daily Golda Acre, MD   200 mg at 08/06/23 5284   SUMAtriptan (IMITREX) tablet 25 mg  25 mg Oral BID PRN [name], NP   25 mg at 07/23/23 1828   traZODone (DESYREL) tablet 50 mg  50 mg Oral QHS [name], NP   50 mg at 08/05/23 2127   Vitamin D (Ergocalciferol) (DRISDOL) 1.25 MG (50000 UNIT) capsule 50,000 Units  50,000 Units Oral Q7 days [name], NP   50,000 Units at 08/03/23 1553    Lab Results: No results found for this or any previous visit (from the past 48 hours).  Blood Alcohol level:  Lab Results  Component Value Date   ETH <10 12/19/2022   ETH <10 11/21/2019    Metabolic Disorder Labs: Lab Results  Component Value Date   HGBA1C 5.6 06/16/2023   MPG 114.02 06/16/2023   MPG 79.58 12/21/2022   No results found for: "PROLACTIN" Lab Results  Component Value Date   CHOL  166 08/01/2023   TRIG 165 (H) 08/01/2023   HDL 59 08/01/2023   CHOLHDL 2.8 08/01/2023   VLDL 33 08/01/2023   LDLCALC 74 08/01/2023   LDLCALC 128 (H) 06/16/2023    Physical Findings: AIMS: Facial and Oral Movements Muscles of Facial Expression: None Lips and Perioral Area: None Jaw: None Tongue: None,Extremity Movements Upper (arms, wrists, hands, fingers): None Lower (legs, knees, ankles, toes): None, Trunk Movements Neck, shoulders, hips: None, Global Judgements Severity of abnormal movements overall : None Incapacitation due to abnormal movements: None Patient's awareness of abnormal movements: No Awareness, Dental Status Current problems with teeth and/or dentures?: No Does patient usually wear dentures?: No  CIWA:    COWS:     Musculoskeletal: Strength & Muscle Tone: within normal limits Gait & Station: normal Patient leans: N/A  Psychiatric Specialty Exam:  Presentation  General Appearance:  Fairly Groomed  Eye Contact: Fair  Speech: Clear and Coherent  Speech Volume: Normal  Handedness: Right   Mood and Affect  Mood: Depressed; Anxious  Affect: Congruent   Thought Process  Thought Processes: Coherent  Descriptions of Associations:Intact  Orientation:Full (Time, Place and Person)  Thought Content:Logical  History of Schizophrenia/Schizoaffective disorder:Yes  Duration of Psychotic Symptoms:N/A  Hallucinations:Hallucinations: None Description of Command Hallucinations: Denies Description of Auditory Hallucinations: Denies  Ideas of Reference:None  Suicidal Thoughts:Suicidal Thoughts: No SI Active Intent and/or Plan: -- (Denies) SI Passive Intent and/or Plan: -- (Denies)  Homicidal Thoughts:Homicidal Thoughts: No   Sensorium  Memory: Immediate Fair  Judgment: Fair  Insight: Fair   Art therapist  Concentration: Fair  Attention Span: Fair  Recall: Fair  Fund of  Knowledge: Fair  Language: Fair   Psychomotor Activity  Psychomotor Activity: Psychomotor Activity:  Normal   Assets  Assets: Resilience   Sleep  Sleep: Sleep: Fair Number of Hours of Sleep: 9    Physical Exam: Physical Exam Vitals and nursing note reviewed.  Neurological:     General: No focal deficit present.     Mental Status: She is oriented to person, place, and time.    Review of Systems  Psychiatric/Behavioral:  Positive for depression. Negative for hallucinations, memory loss, substance abuse and suicidal ideas. The patient is nervous/anxious and has insomnia.   All other systems reviewed and are negative.  Blood pressure 102/81, pulse 72, temperature 98 F (36.7 C), temperature source Oral, resp. rate 18, height 4\' 11"  (1.499 m), weight 63 kg, SpO2 99%. Body mass index is 28.05 kg/m.  Treatment Plan Summary: Daily contact with patient to assess and evaluate symptoms and progress in treatment and Medication management  Safety and Monitoring: Voluntary admission to inpatient psychiatric unit for safety, stabilization and treatment Daily contact with patient to assess and evaluate symptoms and progress in treatment Patient's case to be discussed in multi-disciplinary team meeting Observation Level : q15 minute checks Vital signs: q12 hours Precautions: Safety  Long Term Goal(s): Improvement in symptoms so as ready for discharge  Short Term Goals: Ability to identify changes in lifestyle to reduce recurrence of condition will improve, Ability to verbalize feelings will improve, Ability to disclose and discuss suicidal ideas, Ability to demonstrate self-control will improve, Ability to identify and develop effective coping behaviors will improve, Ability to maintain clinical measurements within normal limits will improve, and Compliance with prescribed medications will improve  Diagnoses Principal Problem:   Schizoaffective disorder, depressive type  (HCC) Active Problems:   GERD (gastroesophageal reflux disease)   Intellectual disability   Tobacco use disorder  Medications  1.  Continue Invega ER 6 mg at bedtime. 2.  Continue sertraline to 200 mg daily. 3.  Continue trazodone 50 mg as needed for insomnia. 4.  Continue to monitor mood behavior and interaction with others. 5.  Continue to encourage unit groups and therapeutic activities. 6.  Continue Synthroid 50 mcg for hypothyroidism 7.  Recheck lipid panel and thyroid to see if labs have normalized. 8.  Check urinalysis due to increased sadness, lethargy, and behavioral change  PRNS -Continue Tylenol 650 mg every 6 hours PRN for mild pain -Continue Maalox 30 mg every 4 hrs PRN for indigestion -Continue Milk of Magnesia as needed every 6 hrs for constipation  Discharge Planning: Social work and case management to assist with discharge planning and identification of hospital follow-up needs prior to discharge Estimated LOS: 5-7 days Discharge Concerns: Need to establish a safety plan; Medication compliance and effectiveness Discharge Goals: Return home with outpatient referrals for mental health follow-up including medication management/psychotherapy  I certify that inpatient services furnished can reasonably be expected to improve the patient's condition.    [name], NP 1/28/20254:43 PM

## 2023-08-06 NOTE — Progress Notes (Signed)
   08/06/23 1002  Psych Admission Type (Psych Patients Only)  Admission Status Involuntary  Psychosocial Assessment  Patient Complaints Depression  Eye Contact Fair  Facial Expression Flat  Affect Anxious  Speech Soft  Interaction Avoidant  Motor Activity Slow  Appearance/Hygiene Poor hygiene  Behavior Characteristics Cooperative  Thought Process  Coherency WDL  Content WDL  Delusions None reported or observed  Perception WDL  Hallucination None reported or observed  Judgment Limited  Confusion None  Danger to Self  Current suicidal ideation? Denies  Self-Injurious Behavior No self-injurious ideation or behavior indicators observed or expressed   Agreement Not to Harm Self Yes  Description of Agreement Verbal  Danger to Others  Danger to Others None reported or observed  Danger to Others Abnormal  Harmful Behavior to others No threats or harm toward other people  Destructive Behavior No threats or harm toward property

## 2023-08-06 NOTE — Progress Notes (Signed)
Pt met with [facility] Representative, Natividad Brood, [place] [phone] to complete intake assessment for services for their Day Program. Assessment completed and it was shared pt can begin services as early as 08/07/23, based upon discharge from [facility].   Pt is scheduled to meet with [facility]  in [facility] on 08/09/23 at 2:30 pm to complete assessment for [employer] ([facility]). [facility] will provide therapy and medication management.  CSW remains in contact with group home owner Janora Norlander who shared she has not received any information from [facility] regarding pt's contract for services at her home.  Mrs. Roxan Hockey also inquired if [facility] would contact someone at [facility] to assist in expediting receipt of contract. CSW will share request with [facility] Supervisor [name].  CSW will contact pt's mother, Francoise Chojnowski and DSS of [place], Thamas Jaegers to discuss pt's disposition. CSW will continue to follow and update team.

## 2023-08-07 ENCOUNTER — Encounter (HOSPITAL_COMMUNITY): Payer: Self-pay

## 2023-08-07 DIAGNOSIS — F251 Schizoaffective disorder, depressive type: Secondary | ICD-10-CM | POA: Diagnosis not present

## 2023-08-07 NOTE — Group Note (Signed)
Recreation Therapy Group Note   Group Topic:Health and Wellness  Group Date: 08/07/2023 Start Time: 0930 End Time: 1000 Facilitators: Kenslei Hearty-McCall, LRT,CTRS Location: [address] Dayroom   Group Topic: Wellness  Goal Area(s) Addresses:  Patient will define components of whole wellness. Patient will verbalize benefit of whole wellness.  Intervention: Music  Activity: Exercise. Patients took turns leading the group in the exercises of their choosing. The group completed three rounds of stretching and exercise. Patients were encouraged to get water, take breaks and not to over extend themselves during the course of the activity. Emphasis was placed on patients paying attention to their bodies and what exercises they are able to complete.   Education: Wellness, Building control surveyor.   Education Outcome: Acknowledges education/In group clarification offered/Needs additional education.   Affect/Mood: Flat   Participation Level: Minimal   Participation Quality: Independent   Behavior: Reluctant   Speech/Thought Process: Relevant   Insight: Moderate   Judgement: Moderate   Modes of Intervention: Music   Patient Response to Interventions:  Receptive   Education Outcome:  In group clarification offered    Clinical Observations/Individualized Feedback: Pt gave minimal participation during group session. Pt eventually left group early and didn't return.    Plan: Continue to engage patient in RT group sessions 2-3x/week.   Yolanda [name], LRT,CTRS 08/07/2023 12:29 PM

## 2023-08-07 NOTE — Progress Notes (Signed)
CSW received information from [facility] that the PCP was completed with pt's mother, Yolanda [name] and they are going to send the signature sheet to DSS of [facility] to sign.   CSW spoke with Ms. Janora Norlander [phone], group home owner who reported she is yet to hear from [facility] dept regarding contract for services. CSW will continue to follow and update treatment team.

## 2023-08-07 NOTE — Group Note (Signed)
Date:  08/07/2023 Time:  4:25 PM  Group Topic/Focus:  Dimensions of Wellness:   The focus of this group is to introduce the topic of wellness and discuss the role each dimension of wellness plays in total health.    Participation Level:  Did Not Attend  Participation Quality:   n/a  Affect:   n/a  Cognitive:   n/a  Insight: None  Engagement in Group:   n/a  Modes of Intervention:   n/a  Additional Comments:   Pt did not attend.   Edmund Hilda Marqual Mi 08/07/2023, 4:25 PM

## 2023-08-07 NOTE — Group Note (Signed)
Date:  08/07/2023 Time:  11:48 AM  Group Topic/Focus:  Goals Group:   The focus of this group is to help patients establish daily goals to achieve during treatment and discuss how the patient can incorporate goal setting into their daily lives to aide in recovery. Orientation:   The focus of this group is to educate the patient on the purpose and policies of crisis stabilization and provide a format to answer questions about their admission.  The group details unit policies and expectations of patients while admitted.    Participation Level:  Minimal  Participation Quality:  Attentive  Affect:  Appropriate  Cognitive:  Appropriate  Insight: None  Engagement in Group:  Limited  Modes of Intervention:  Activity, Orientation, and Rapport Building  Additional Comments:   Pt attended the Orientation and Goals group. Pt was attentive throughout the group. Pt did not complete the SMART goals activity.   Edmund Hilda October Peery 08/07/2023, 11:48 AM

## 2023-08-07 NOTE — Group Note (Signed)
Date:  08/07/2023 Time:  10:11 PM  Group Topic/Focus:  Narcotics Anonymous (NA) Meeting    Participation Level:  Did Not Attend  Participation Quality:   N/A  Affect:   N/A  Cognitive:   N/A  Insight: None  Engagement in Group:   N/A  Modes of Intervention:   N/A  Additional Comments:  Patient did not attend NA or wrap up group.   Kennieth Francois 08/07/2023, 10:11 PM

## 2023-08-07 NOTE — Progress Notes (Cosign Needed Addendum)
[facility] MD Progress Note  08/07/2023 9:01 AM Yolanda [name]  MRN:  [id number] Principal Problem: Schizoaffective disorder, depressive type (HCC) Diagnosis: Principal Problem:   Schizoaffective disorder, depressive type (HCC) Active Problems:   GERD (gastroesophageal reflux disease)   Intellectual disability   Tobacco use disorder  HPI: This is the first psychiatric admission in this [facility] in 49 years for this AA female with an extensive hx of mental illnesses & probable polysubstance use disorders. She is admitted to the [facility] from the [facility] hospital with complain of worsening suicidal ideations with plan to stab herself. Per chart review, patient apparently reported at the ED that she has been depressed for a while & has not been taking her mental health medications. After medical evaluation.clearance, she was transferred to the [facility] for further psychiatric evaluation/treatments.    Chart Review from past 24 hours The patient's chart and nursing notes were reviewed. The patient's case was discussed in multidisciplinary team meeting. Sleep Hours last night: 6.25   Nursing Concerns: None reported. Behavioral episodes in the past 24 hrs: None reported.  Medication Compliance: Compliant  Vital Signs in the past 24 hrs: SBP low at 87.  Patient encouraged to increase oral fluid intake.  Nursing to recheck BP.  PRN Medications in the past 24 hrs: None, per nursing record.    Patient assessment note: During today's rounds, the patient was seen in her room, lying in bed awake.  On assessment today, the patient described her mood as "okay."  She was encouraged to increase her oral fluid intake due to low systemic blood pressure and verbalized understanding.  She reported having a good appetite. The patient noted some difficulty falling asleep last night due to excitement about her anticipated discharge this morning. Despite the delay, she stated that she feels good about the discharge projected for  tomorrow.  When asked about the first thing she plans to do after discharge, she stated she intends to smoke a cigarette.  She was educated and encouraged to avoid nicotine products.  She reported that her oldest daughter also advised her against smoking, but she told her daughter that cigarettes help her calm down.  She stated her daughter responded that they would find an alternative coping strategy. She denies suicidal or homicidal ideation, as well as auditory or visual hallucinations, paranoia, or delusional thoughts.  The patient was observed smiling and appears eager for discharge after a lengthy hospitalization. Communication during the encounter was clear, with no signs of distractibility or preoccupation. Patient reported that her current medication regimen is effectively managing her psychiatric symptoms without causing any side effects.     Case staffed with attending psychiatrist, V. Izediuno. The patient's psychiatric symptoms remain stable. No changes to current medication regimen clinically indicated at this time.     According to social work, as reported in the multidisciplinary team meeting this morning, the patient's mother has a final meeting with the day program this afternoon. The current plan is for the patient to be discharged home under her mother's care with resources, on Thursday, January 30.   Total Time spent with patient: 45 minutes  Past Psychiatric History: See H & P  Past Medical History:  Past Medical History:  Diagnosis Date   Bipolar affect, depressed (HCC)    Constipation 08/17/2022   Depression    Falls 07/21/2022   Fracture of femoral neck, right, closed (HCC) 01/17/2022   Herpes simplex 08/22/2017   Intellectual disability    Open fracture dislocation  of right elbow joint 01/17/2022    Past Surgical History:  Procedure Laterality Date   NO PAST SURGERIES     SALPINGECTOMY     Family History: History reviewed. No pertinent family history. Family  Psychiatric  History: See H & P Social History:  Social History   Substance and Sexual Activity  Alcohol Use Yes     Social History   Substance and Sexual Activity  Drug Use Yes   Types: Cocaine, Marijuana    Social History   Socioeconomic History   Marital status: Single    Spouse name: Not on file   Number of children: Not on file   Years of education: Not on file   Highest education level: Not on file  Occupational History   Not on file  Tobacco Use   Smoking status: Every Day   Smokeless tobacco: Not on file  Substance and Sexual Activity   Alcohol use: Yes   Drug use: Yes    Types: Cocaine, Marijuana   Sexual activity: Yes  Other Topics Concern   Not on file  Social History Narrative   Not on file   Social Drivers of Health   Financial Resource Strain: Low Risk  (09/12/2022)   Received [facility]   Overall Financial Resource Strain (CARDIA)    Difficulty of Paying Living Expenses: Not hard at all  Food Insecurity: Patient Declined (12/20/2022)   Hunger Vital Sign    Worried About Running Out of Food in the Last Year: Patient declined    Ran Out of Food in the Last Year: Patient declined  Transportation Needs: No Transportation Needs (12/20/2022)   PRAPARE - Administrator, Civil Service (Medical): No    Lack of Transportation (Non-Medical): No  Physical Activity: Not on file  Stress: No Stress Concern Present (07/17/2022)   Received from [facility], [facility] of Occupational Health - Occupational Stress Questionnaire    Feeling of Stress : Not at all  Social Connections: Unknown (07/16/2022)   Received from [facility], [facility]   Social Network    Social Network: Not on file   Sleep: Fair  Appetite:  Fair  Current Medications: Current Facility-Administered Medications  Medication Dose Route Frequency Provider Last Rate Last Admin   acetaminophen (TYLENOL) tablet 650 mg  650 mg Oral Q6H  PRN [name], NP   650 mg at 08/06/23 1415   alum & mag hydroxide-simeth (MAALOX/MYLANTA) 200-200-20 MG/5ML suspension 30 mL  30 mL Oral Q4H PRN Onuoha, Chinwendu V, NP   30 mL at 05/31/23 0801   atorvastatin (LIPITOR) tablet 10 mg  10 mg Oral Daily [name], NP   10 mg at 08/06/23 1824   busPIRone (BUSPAR) tablet 15 mg  15 mg Oral BID [name], NP   15 mg at 08/07/23 0845   cyanocobalamin (VITAMIN B12) injection 1,000 mcg  1,000 mcg Intramuscular Q30 days Sarita Bottom, MD   1,000 mcg at 08/01/23 1235   diphenhydrAMINE (BENADRYL) capsule 50 mg  50 mg Oral TID PRN [name], NP   50 mg at 06/03/23 1110   Or   diphenhydrAMINE (BENADRYL) injection 50 mg  50 mg Intramuscular TID PRN [name], NP       docusate sodium (COLACE) capsule 100 mg  100 mg Oral Daily Nkwenti, Doris, NP   100 mg at 08/07/23 0845   haloperidol (HALDOL) tablet 5 mg  5 mg Oral TID  PRN [name], NP   5 mg at 06/03/23 1110   Or   haloperidol lactate (HALDOL) injection 5 mg  5 mg Intramuscular TID PRN [name], NP       hydrocortisone cream 1 %   Topical BID Cecilie Lowers, FNP   1 Application at 08/06/23 1625   hydrOXYzine (ATARAX) tablet 25 mg  25 mg Oral TID PRN [name], DO   25 mg at 08/05/23 1256   levothyroxine (SYNTHROID) tablet 75 mcg  75 mcg Oral Q0600 Golda Acre, MD   75 mcg at 08/07/23 0631   LORazepam (ATIVAN) tablet 2 mg  2 mg Oral TID PRN [name], NP   2 mg at 06/03/23 1110   Or   LORazepam (ATIVAN) injection 2 mg  2 mg Intramuscular TID PRN [name], NP       magnesium hydroxide (MILK OF MAGNESIA) suspension 30 mL  30 mL Oral Daily PRN [name], NP   30 mL at 07/18/23 1701   melatonin tablet 5 mg  5 mg Oral QHS Nkwenti, Doris, NP   5 mg at 08/06/23 2134   nicotine polacrilex (NICORETTE) gum 2 mg  2 mg Oral PRN [name], MD   2 mg at 08/06/23 1415   paliperidone (INVEGA) 24 hr tablet 6 mg  6 mg Oral Daily [name], NP   6 mg at 08/06/23  2134   pantoprazole (PROTONIX) EC tablet 40 mg  40 mg Oral Daily Nwoko, Nicole Kindred I, NP   40 mg at 08/07/23 0845   polyethylene glycol (MIRALAX / GLYCOLAX) packet 17 g  17 g Oral Daily PRN [name], MD   17 g at 06/07/23 1847   selenium sulfide (SELSUN) 1 % shampoo   Topical Daily PRN Georgiann Cocker, MD   Given at 07/20/23 2218   sertraline (ZOLOFT) tablet 200 mg  200 mg Oral Daily Golda Acre, MD   200 mg at 08/07/23 0845   SUMAtriptan (IMITREX) tablet 25 mg  25 mg Oral BID PRN [name], NP   25 mg at 07/23/23 1828   traZODone (DESYREL) tablet 50 mg  50 mg Oral QHS [name], NP   50 mg at 08/06/23 2134   Vitamin D (Ergocalciferol) (DRISDOL) 1.25 MG (50000 UNIT) capsule 50,000 Units  50,000 Units Oral Q7 days [name], NP   50,000 Units at 08/03/23 1553    Lab Results: No results found for this or any previous visit (from the past 48 hours).  Blood Alcohol level:  Lab Results  Component Value Date   ETH <10 12/19/2022   ETH <10 11/21/2019    Metabolic Disorder Labs: Lab Results  Component Value Date   HGBA1C 5.6 06/16/2023   MPG 114.02 06/16/2023   MPG 79.58 12/21/2022   No results found for: "PROLACTIN" Lab Results  Component Value Date   CHOL 166 08/01/2023   TRIG 165 (H) 08/01/2023   HDL 59 08/01/2023   CHOLHDL 2.8 08/01/2023   VLDL 33 08/01/2023   LDLCALC 74 08/01/2023   LDLCALC 128 (H) 06/16/2023    Physical Findings: AIMS: Facial and Oral Movements Muscles of Facial Expression: None Lips and Perioral Area: None Jaw: None Tongue: None,Extremity Movements Upper (arms, wrists, hands, fingers): None Lower (legs, knees, ankles, toes): None, Trunk Movements Neck, shoulders, hips: None, Global Judgements Severity of abnormal movements overall : None Incapacitation due to abnormal movements: None Patient's awareness of abnormal movements: No Awareness, Dental Status Current  problems with teeth and/or dentures?: No Does patient usually  wear dentures?: No  CIWA:   N/A COWS:   N/A  Musculoskeletal: Strength & Muscle Tone: within normal limits Gait & Station: normal Patient leans: N/A  Psychiatric Specialty Exam:  Presentation  General Appearance:  Fairly Groomed  Eye Contact: Fair  Speech: Clear and Coherent  Speech Volume: Normal  Handedness: Right   Mood and Affect  Mood: Depressed; Anxious  Affect: Congruent   Thought Process  Thought Processes: Coherent  Descriptions of Associations:Intact  Orientation:Full (Time, Place and Person)  Thought Content:Logical  History of Schizophrenia/Schizoaffective disorder:Yes  Duration of Psychotic Symptoms:N/A  Hallucinations:Hallucinations: None  Ideas of Reference:None  Suicidal Thoughts:Suicidal Thoughts: No  Homicidal Thoughts:Homicidal Thoughts: No   Sensorium  Memory: Immediate Fair  Judgment: Fair  Insight: Fair   Art therapist  Concentration: Fair  Attention Span: Fair  Recall: [employer] of Knowledge: Fair  Language: Fair   Psychomotor Activity  Psychomotor Activity: Psychomotor Activity: Normal   Assets  Assets: Resilience   Sleep  Sleep: Sleep: Fair    Physical Exam: Physical Exam Vitals and nursing note reviewed.  Constitutional:      Appearance: She is normal weight.  HENT:     Head: Atraumatic.     Nose: Nose normal.  Eyes:     Conjunctiva/sclera: Conjunctivae normal.  Pulmonary:     Effort: No respiratory distress.  Musculoskeletal:        General: Normal range of motion.     Cervical back: Normal range of motion.  Skin:    General: Skin is dry.  Neurological:     General: No focal deficit present.     Mental Status: She is alert and oriented to person, place, and time.    Review of Systems  Constitutional:  Negative for diaphoresis, fever, malaise/fatigue and weight loss.  HENT: Negative.    Respiratory: Negative.    Cardiovascular: Negative.    Gastrointestinal: Negative.   Psychiatric/Behavioral:  Positive for depression. Negative for hallucinations, memory loss, substance abuse and suicidal ideas. The patient is nervous/anxious and has insomnia.   All other systems reviewed and are negative.  Blood pressure (!) 87/70, pulse 93, temperature 98.4 F (36.9 C), resp. rate 18, height 4\' 11"  (1.499 m), weight 63 kg, SpO2 97%. Body mass index is 28.05 kg/m.  Treatment Plan Summary: Daily contact with patient to assess and evaluate symptoms and progress in treatment and Medication management  Safety and Monitoring: Voluntary admission to inpatient psychiatric unit for safety, stabilization and treatment Daily contact with patient to assess and evaluate symptoms and progress in treatment Patient's case to be discussed in multi-disciplinary team meeting Observation Level : q15 minute checks Vital signs: q12 hours Precautions: Safety  Long Term Goal(s): Improvement in symptoms so as ready for discharge  Short Term Goals: Ability to identify changes in lifestyle to reduce recurrence of condition will improve, Ability to verbalize feelings will improve, Ability to disclose and discuss suicidal ideas, Ability to demonstrate self-control will improve, Ability to identify and develop effective coping behaviors will improve, Ability to maintain clinical measurements within normal limits will improve, and Compliance with prescribed medications will improve  Diagnoses Principal Problem:   Schizoaffective disorder, depressive type (HCC) Active Problems:   GERD (gastroesophageal reflux disease)   Intellectual disability   Tobacco use disorder  Medications  1.  Continue Invega ER 6 mg at bedtime. 2.  Continue sertraline to 200 mg daily. 3.  Continue trazodone 50 mg as  needed for insomnia. 4.  Continue to monitor mood behavior and interaction with others. 5.  Continue to encourage unit groups and therapeutic activities. 6.  Continue  Synthroid 50 mcg for hypothyroidism 7.  Recheck lipid panel and thyroid to see if labs have normalized. 8.  Check urinalysis due to increased sadness, lethargy, and behavioral change  PRNS -Continue Tylenol 650 mg every 6 hours PRN for mild pain -Continue Maalox 30 mg every 4 hrs PRN for indigestion -Continue Milk of Magnesia as needed every 6 hrs for constipation  Discharge Planning: Social work and case management to assist with discharge planning and identification of hospital follow-up needs prior to discharge Estimated LOS: 5-7 days Discharge Concerns: Need to establish a safety plan; Medication compliance and effectiveness Discharge Goals: Return home with outpatient referrals for mental health follow-up including medication management/psychotherapy  I certify that inpatient services furnished can reasonably be expected to improve the patient's condition.    Norma Fredrickson, NP 1/29/20259:01 AM    Patient ID: Yolanda [name], female   DOB: [birth year], 48 y.o.   MRN: [id number]

## 2023-08-07 NOTE — Plan of Care (Signed)
Problem: Education: Goal: Ability to make informed decisions regarding treatment will improve Outcome: Progressing   Problem: Coping: Goal: Coping ability will improve Outcome: Progressing   Problem: Health Behavior/Discharge Planning: Goal: Identification of resources available to assist in meeting health care needs will improve Outcome: Progressing   Problem: Medication: Goal: Compliance with prescribed medication regimen will improve Outcome: Progressing

## 2023-08-07 NOTE — BH IP Treatment Plan (Signed)
Interdisciplinary Treatment and Diagnostic Plan Update  08/07/2023 Time of Session: 1147 - UPDATE Yolanda [name] MRN: [id number]  Principal Diagnosis: Schizoaffective disorder, depressive type (HCC)  Secondary Diagnoses: Principal Problem:   Schizoaffective disorder, depressive type (HCC) Active Problems:   GERD (gastroesophageal reflux disease)   Intellectual disability   Tobacco use disorder   Current Medications:  Current Facility-Administered Medications  Medication Dose Route Frequency Provider Last Rate Last Admin   acetaminophen (TYLENOL) tablet 650 mg  650 mg Oral Q6H PRN [name], NP   650 mg at 08/06/23 1415   alum & mag hydroxide-simeth (MAALOX/MYLANTA) 200-200-20 MG/5ML suspension 30 mL  30 mL Oral Q4H PRN Onuoha, Chinwendu V, NP   30 mL at 05/31/23 0801   atorvastatin (LIPITOR) tablet 10 mg  10 mg Oral Daily [name], NP   10 mg at 08/06/23 1824   busPIRone (BUSPAR) tablet 15 mg  15 mg Oral BID [name], NP   15 mg at 08/07/23 0845   cyanocobalamin (VITAMIN B12) injection 1,000 mcg  1,000 mcg Intramuscular Q30 days Sarita Bottom, MD   1,000 mcg at 08/01/23 1235   diphenhydrAMINE (BENADRYL) capsule 50 mg  50 mg Oral TID PRN [name], NP   50 mg at 06/03/23 1110   Or   diphenhydrAMINE (BENADRYL) injection 50 mg  50 mg Intramuscular TID PRN [name], NP       docusate sodium (COLACE) capsule 100 mg  100 mg Oral Daily Nkwenti, Doris, NP   100 mg at 08/07/23 0845   haloperidol (HALDOL) tablet 5 mg  5 mg Oral TID PRN [name], NP   5 mg at 06/03/23 1110   Or   haloperidol lactate (HALDOL) injection 5 mg  5 mg Intramuscular TID PRN [name], NP       hydrocortisone cream 1 %   Topical BID Ntuen, Jesusita Oka, FNP   1 Application at 08/06/23 1625   hydrOXYzine (ATARAX) tablet 25 mg  25 mg Oral TID PRN [name], DO   25 mg at 08/05/23 1256   levothyroxine (SYNTHROID) tablet 75 mcg  75 mcg Oral Q0600 Golda Acre, MD   75 mcg at 08/07/23  0631   LORazepam (ATIVAN) tablet 2 mg  2 mg Oral TID PRN [name], NP   2 mg at 06/03/23 1110   Or   LORazepam (ATIVAN) injection 2 mg  2 mg Intramuscular TID PRN [name], NP       magnesium hydroxide (MILK OF MAGNESIA) suspension 30 mL  30 mL Oral Daily PRN [name], NP   30 mL at 07/18/23 1701   melatonin tablet 5 mg  5 mg Oral QHS Nkwenti, Doris, NP   5 mg at 08/06/23 2134   nicotine polacrilex (NICORETTE) gum 2 mg  2 mg Oral PRN [name], MD   2 mg at 08/07/23 1444   paliperidone (INVEGA) 24 hr tablet 6 mg  6 mg Oral Daily Nkwenti, Tyler Aas, NP   6 mg at 08/06/23 2134   pantoprazole (PROTONIX) EC tablet 40 mg  40 mg Oral Daily Nwoko, Nicole Kindred I, NP   40 mg at 08/07/23 0845   polyethylene glycol (MIRALAX / GLYCOLAX) packet 17 g  17 g Oral Daily PRN [name], MD   17 g at 06/07/23 1847   selenium sulfide (SELSUN) 1 % shampoo   Topical Daily PRN Georgiann Cocker, MD   Given at 07/20/23 2218   sertraline (ZOLOFT) tablet  200 mg  200 mg Oral Daily Golda Acre, MD   200 mg at 08/07/23 0845   SUMAtriptan (IMITREX) tablet 25 mg  25 mg Oral BID PRN [name], NP   25 mg at 07/23/23 1828   traZODone (DESYREL) tablet 50 mg  50 mg Oral QHS [name], NP   50 mg at 08/06/23 2134   Vitamin D (Ergocalciferol) (DRISDOL) 1.25 MG (50000 UNIT) capsule 50,000 Units  50,000 Units Oral Q7 days [name], NP   50,000 Units at 08/03/23 1553   PTA Medications: Medications Prior to Admission  Medication Sig Dispense Refill Last Dose/Taking   busPIRone (BUSPAR) 15 MG tablet Take 15 mg by mouth 2 (two) times daily. (Patient not taking: Reported on 12/19/2022)      paliperidone (INVEGA SUSTENNA) 156 MG/ML SUSY injection Inject 156 mg into the muscle once. (Patient not taking: Reported on 12/19/2022)      sertraline (ZOLOFT) 50 MG tablet Take 150 mg by mouth daily. (Patient not taking: Reported on 12/19/2022)      traZODone (DESYREL) 100 MG tablet Take 100 mg by mouth at  bedtime as needed for sleep. (Patient not taking: Reported on 11/12/2022)       Patient Stressors: Medication change or noncompliance    Patient Strengths: Forensic psychologist fund of knowledge   Treatment Modalities: Medication Management, Group therapy, Case management,  1 to 1 session with clinician, Psychoeducation, Recreational therapy.   Physician Treatment Plan for Primary Diagnosis: Schizoaffective disorder, depressive type (HCC) Long Term Goal(s): Improvement in symptoms so as ready for discharge   Short Term Goals: Ability to identify changes in lifestyle to reduce recurrence of condition will improve Ability to verbalize feelings will improve Ability to disclose and discuss suicidal ideas Ability to demonstrate self-control will improve Ability to identify and develop effective coping behaviors will improve Ability to maintain clinical measurements within normal limits will improve Compliance with prescribed medications will improve  Medication Management: Evaluate patient's response, side effects, and tolerance of medication regimen.  Therapeutic Interventions: 1 to 1 sessions, Unit Group sessions and Medication administration.  Evaluation of Outcomes: Progressing  Physician Treatment Plan for Secondary Diagnosis: Principal Problem:   Schizoaffective disorder, depressive type (HCC) Active Problems:   GERD (gastroesophageal reflux disease)   Intellectual disability   Tobacco use disorder  Long Term Goal(s): Improvement in symptoms so as ready for discharge   Short Term Goals: Ability to identify changes in lifestyle to reduce recurrence of condition will improve Ability to verbalize feelings will improve Ability to disclose and discuss suicidal ideas Ability to demonstrate self-control will improve Ability to identify and develop effective coping behaviors will improve Ability to maintain clinical measurements within normal limits will improve Compliance  with prescribed medications will improve     Medication Management: Evaluate patient's response, side effects, and tolerance of medication regimen.  Therapeutic Interventions: 1 to 1 sessions, Unit Group sessions and Medication administration.  Evaluation of Outcomes: Progressing   RN Treatment Plan for Primary Diagnosis: Schizoaffective disorder, depressive type (HCC) Long Term Goal(s): Knowledge of disease and therapeutic regimen to maintain health will improve  Short Term Goals: Ability to remain free from injury will improve, Ability to participate in decision making will improve, Ability to verbalize feelings will improve, Ability to disclose and discuss suicidal ideas, Ability to identify and develop effective coping behaviors will improve, and Compliance with prescribed medications will improve  Medication Management: RN will administer medications as ordered by provider, will assess and  evaluate patient's response and provide education to patient for prescribed medication. RN will report any adverse and/or side effects to prescribing provider.  Therapeutic Interventions: 1 on 1 counseling sessions, Psychoeducation, Medication administration, Evaluate responses to treatment, Monitor vital signs and CBGs as ordered, Perform/monitor CIWA, COWS, AIMS and Fall Risk screenings as ordered, Perform wound care treatments as ordered.  Evaluation of Outcomes: Progressing   LCSW Treatment Plan for Primary Diagnosis: Schizoaffective disorder, depressive type (HCC) Long Term Goal(s): Safe transition to appropriate next level of care at discharge, Engage patient in therapeutic group addressing interpersonal concerns.  Short Term Goals: Engage patient in aftercare planning with referrals and resources, Increase social support, Increase ability to appropriately verbalize feelings, Increase emotional regulation, Facilitate acceptance of mental health diagnosis and concerns, Identify triggers associated  with mental health/substance abuse issues, and Increase skills for wellness and recovery  Therapeutic Interventions: Assess for all discharge needs, 1 to 1 time with Social worker, Explore available resources and support systems, Assess for adequacy in community support network, Educate family and significant other(s) on suicide prevention, Complete Psychosocial Assessment, Interpersonal group therapy.  Evaluation of Outcomes: Progressing   Progress in Treatment: Attending groups: attended some groups Participating in groups: attended some groups Taking medication as prescribed: Yes. Toleration medication: Yes. Family/Significant other contact made: Yes, individual(s) contacted:  Tashica Provencio [phone] Patient understands diagnosis: Yes. Discussing patient identified problems/goals with staff: Yes. Medical problems stabilized or resolved: Yes. Denies suicidal/homicidal ideation: Yes. Issues/concerns per patient self-inventory: No.   New problem(s) identified: none reported   New Short Term/Long Term Goal(s): medication stabilization, elimination of SI thoughts, development of comprehensive mental wellness plan.      Patient Goals:  " placement"   Discharge Plan or Barriers: Patient recently admitted. CSW will continue to follow and assess for appropriate referrals and possible discharge planning.      Reason for Continuation of Hospitalization: Depression Suicidal ideation   Estimated Length of Stay: Pt still expected to discharge  to group home awaiting Single Case agreement and enhanced rate from [facility], otherwise patient will discharge home with mother and have [facility] (was picked up by [facility], will follow at discharge)  Last 3 [place] Suicide Severity Risk Score: Flowsheet Row Ad[facility] (Current) [facility] from 12/19/2022 in [facility] Department at [facility] ED from 11/12/2022 in [facility] Emergency Department at [facility]  C-SSRS RISK CATEGORY High Risk High Risk Moderate Risk       Last [facility] 2/9 Scores:     No data to display          Scribe for Treatment Team: Jacinta Shoe, LCSW 08/07/2023 4:19 PM

## 2023-08-07 NOTE — Progress Notes (Signed)
   08/07/23 1700  Psych Admission Type (Psych Patients Only)  Admission Status Involuntary  Psychosocial Assessment  Patient Complaints Depression  Eye Contact Fair  Facial Expression Flat  Affect Anxious  Speech Soft  Interaction Avoidant  Motor Activity Slow  Appearance/Hygiene Poor hygiene  Behavior Characteristics Cooperative  Mood Depressed  Thought Process  Coherency WDL  Content WDL  Delusions None reported or observed  Perception WDL  Hallucination None reported or observed  Judgment Limited  Confusion None  Danger to Self  Current suicidal ideation? Denies  Self-Injurious Behavior No self-injurious ideation or behavior indicators observed or expressed   Agreement Not to Harm Self Yes  Description of Agreement verbal contract  Danger to Others  Danger to Others None reported or observed  Danger to Others Abnormal  Harmful Behavior to others No threats or harm toward other people  Destructive Behavior No threats or harm toward property

## 2023-08-08 DIAGNOSIS — F251 Schizoaffective disorder, depressive type: Secondary | ICD-10-CM | POA: Diagnosis not present

## 2023-08-08 NOTE — Plan of Care (Signed)
Problem: Education: Goal: Ability to make informed decisions regarding treatment will improve Outcome: Progressing   Problem: Coping: Goal: Coping ability will improve Outcome: Progressing   Problem: Health Behavior/Discharge Planning: Goal: Identification of resources available to assist in meeting health care needs will improve Outcome: Progressing   Problem: Medication: Goal: Compliance with prescribed medication regimen will improve Outcome: Progressing

## 2023-08-08 NOTE — Progress Notes (Signed)
   08/08/23 1000  Psych Admission Type (Psych Patients Only)  Admission Status Involuntary  Psychosocial Assessment  Patient Complaints Depression  Eye Contact Fair  Facial Expression Animated  Affect Anxious  Speech Soft  Interaction Assertive  Motor Activity Slow  Appearance/Hygiene Disheveled  Behavior Characteristics Cooperative  Mood Anxious;Pleasant;Sad  Aggressive Behavior  Effect No apparent injury  Thought Process  Coherency WDL  Content WDL  Delusions None reported or observed  Perception WDL  Hallucination None reported or observed  Judgment Limited  Confusion None  Danger to Self  Current suicidal ideation? Denies  Danger to Others  Danger to Others None reported or observed  Danger to Others Abnormal  Harmful Behavior to others No threats or harm toward other people  Destructive Behavior No threats or harm toward property

## 2023-08-08 NOTE — [facility] Group Notes (Signed)
Patient did not attend the Emotional Wellness group.

## 2023-08-08 NOTE — Plan of Care (Signed)
  Problem: Medication: Goal: Compliance with prescribed medication regimen will improve 08/08/2023 1900 by Shela Nevin, RN Outcome: Progressing 08/08/2023 1859 by Shela Nevin, RN Outcome: Progressing   Problem: Coping: Goal: Coping ability will improve Outcome: Progressing

## 2023-08-08 NOTE — Group Note (Signed)
Date:  08/08/2023 Time:  9:50 PM  Group Topic/Focus:  Wrap-Up Group:   The focus of this group is to help patients review their daily goal of treatment and discuss progress on daily workbooks.    Participation Level:  Active  Participation Quality:  Appropriate and Sharing  Affect:  Appropriate  Cognitive:  Appropriate  Insight: Appropriate  Engagement in Group:  Engaged  Modes of Intervention:  Activity and Socialization  Additional Comments:  Patients used and completed wrap up group sheets during group. Patient rated her day a 5/10. Patient shared that her goal for today was "to pack for discharge tomorrow". Patient shared that she did achieve her goal. Patient shared coping skills that she finds most helpful is "reading her bible and talking". Patient shared something that she likes about herself is "my smile". Patient participated in group activity after sharing.   Kennieth Francois 08/08/2023, 9:50 PM

## 2023-08-08 NOTE — Group Note (Signed)
LCSW Group Therapy Note  Group Date: 08/08/2023 Start Time: 1100 End Time: 1200   Type of Therapy:  Group Therapy  Topic:  Understanding Your Path to Change  Participation:  did not attend  Objective:  The goal is to help participants understand the stages of change, identify where they currently are in the process, and provide actionable next steps to continue moving forward in their journey of change.  Goals: Learn about the six stages of change:  Precontemplation, Contemplation, Preparation, Action, Maintenance, and Relapse Reflect on Current Change Efforts:  Recognize which stage participants are in regarding to a personal change. Plan Next Steps for Moving Forward:  Create an action plan based on their current stage of change.  Class Summary: In this session, we explored the Stages of Change as a framework to understand the process of change.  We discussed how each stage helps individuals recognize where they are in their personal journey and used the Stages of Change Worksheet for self-reflection.  Participants answered questions to better understand their current stage, challenges, and progress. We also emphasized the importance of moving forward, even if setbacks (Relapse) occur, and created actionable steps to help participants continue progressing. By the end of the session, participants gained a clearer understanding of their path to change and left with a clear plan for next steps.  Therapeutic Modalities:  Elements of CBT (cognitive restructuring, problem solving)  Element of DBT (mindfulness, distress tolerance)   Darcia Lampi O Brydan Downard, LCSWA 08/08/2023  6:29 PM

## 2023-08-08 NOTE — Progress Notes (Signed)
[facility] MD Progress Note  08/08/2023 3:06 PM Yolanda [name]  MRN:  [id number] Principal Problem: Schizoaffective disorder, depressive type (HCC)  Diagnosis: Principal Problem:   Schizoaffective disorder, depressive type (HCC) Active Problems:   GERD (gastroesophageal reflux disease)   Intellectual disability   Tobacco use disorder  Subjective: This is the first psychiatric admission in this [facility] in 49 years for this AA female with an extensive hx of mental illnesses & probable polysubstance use disorders. She is admitted to the [facility] from the [facility] hospital with complain of worsening suicidal ideations with plan to stab herself. Per chart review, patient apparently reported at the ED that she has been depressed for a while & has not been taking her mental health medications. After medical evaluation.clearance, she was transferred to the [facility] for further psychiatric evaluation/treatments.    Chart Review from past 24 hours The patient's chart and nursing notes were reviewed. The patient's case was discussed in multidisciplinary team meeting. Sleep Hours last night: 8 hours   Nursing Concerns: None reported. Behavioral episodes in the past 24 hrs: None reported.  Medication Compliance: Compliant  Vital Signs in the past 24 hrs: stable.  PRN Medications in the past 24 hrs: None, per nursing record.   Date note: Yolanda [name] is seen in her room. Chart reviewed. The chart findings discussed with the treatment team. She presents alert, oriented & aware of situation. She is visible on the unit, attending group sessions. She reports, "Am I going home today? Did my mother change her mind about me coming home". Patient is explained that it is more than likely that she may not go home today. Patient's mood & demeanor changed instantly after learning that she may not go home today. She became saddened as she has been anticipating her discharge for a long time time. However, she did brighten up when she  mentions that she learned from one of the attending that she will have a send off party on the day that she will finally  leave the hospital because she waited so long for it. Patient states that she will prefer chocolate cake. She currently denies any SIHI, AVH, delusional thoughts or paranoia. She does not appear to be responding to any internal stimuli. There are no changes made on her current plan of care. Continue as already in progress.  Per previous notes: According to social work, as reported in the multidisciplinary team meeting this morning, the patient's mother has a final meeting with the day program this afternoon. The current plan is for the patient to be discharged home under her mother's care with resources, on Thursday, January 30.   Total Time spent with patient: 30 minutes  Past Psychiatric History: See H & P  Past Medical History:  Past Medical History:  Diagnosis Date   Bipolar affect, depressed (HCC)    Constipation 08/17/2022   Depression    Falls 07/21/2022   Fracture of femoral neck, right, closed (HCC) 01/17/2022   Herpes simplex 08/22/2017   Intellectual disability    Open fracture dislocation of right elbow joint 01/17/2022    Past Surgical History:  Procedure Laterality Date   NO PAST SURGERIES     SALPINGECTOMY     Family History: History reviewed. No pertinent family history.  Family Psychiatric  History: See H & P  Social History:  Social History   Substance and Sexual Activity  Alcohol Use Yes     Social History   Substance and Sexual Activity  Drug  Use Yes   Types: Cocaine, Marijuana    Social History   Socioeconomic History   Marital status: Single    Spouse name: Not on file   Number of children: Not on file   Years of education: Not on file   Highest education level: Not on file  Occupational History   Not on file  Tobacco Use   Smoking status: Every Day   Smokeless tobacco: Not on file  Substance and Sexual Activity    Alcohol use: Yes   Drug use: Yes    Types: Cocaine, Marijuana   Sexual activity: Yes  Other Topics Concern   Not on file  Social History Narrative   Not on file   Social Drivers of Health   Financial Resource Strain: Low Risk  (09/12/2022)   Received from [facility], [facility]   Overall Financial Resource Strain (CARDIA)    Difficulty of Paying Living Expenses: Not hard at all  Food Insecurity: Patient Declined (12/20/2022)   Hunger Vital Sign    Worried About Running Out of Food in the Last Year: Patient declined    Ran Out of Food in the Last Year: Patient declined  Transportation Needs: No Transportation Needs (12/20/2022)   PRAPARE - Administrator, Civil Service (Medical): No    Lack of Transportation (Non-Medical): No  Physical Activity: Not on file  Stress: No Stress Concern Present (07/17/2022)   Received from [facility], [facility] - Occupational Stress Questionnaire    Feeling of Stress : Not at all  Social Connections: Unknown (07/16/2022)   Received from [facility], [facility]   Social Network    Social Network: Not on file   Sleep: Good  Appetite:  Fair  Current Medications: Current Facility-Administered Medications  Medication Dose Route Frequency Provider Last Rate Last Admin   acetaminophen (TYLENOL) tablet 650 mg  650 mg Oral Q6H PRN [name], NP   650 mg at 08/06/23 1415   alum & mag hydroxide-simeth (MAALOX/MYLANTA) 200-200-20 MG/5ML suspension 30 mL  30 mL Oral Q4H PRN Onuoha, Chinwendu V, NP   30 mL at 05/31/23 0801   atorvastatin (LIPITOR) tablet 10 mg  10 mg Oral Daily [name], NP   10 mg at 08/07/23 1812   busPIRone (BUSPAR) tablet 15 mg  15 mg Oral BID [name], NP   15 mg at 08/08/23 0818   cyanocobalamin (VITAMIN B12) injection 1,000 mcg  1,000 mcg Intramuscular Q30 days Sarita Bottom, MD   1,000 mcg at 08/01/23 1235   diphenhydrAMINE (BENADRYL) capsule 50 mg  50  mg Oral TID PRN [name], NP   50 mg at 06/03/23 1110   Or   diphenhydrAMINE (BENADRYL) injection 50 mg  50 mg Intramuscular TID PRN [name], NP       docusate sodium (COLACE) capsule 100 mg  100 mg Oral Daily Nkwenti, Tyler Aas, NP   100 mg at 08/08/23 0818   haloperidol (HALDOL) tablet 5 mg  5 mg Oral TID PRN [name], NP   5 mg at 06/03/23 1110   Or   haloperidol lactate (HALDOL) injection 5 mg  5 mg Intramuscular TID PRN [name], NP       hydrocortisone cream 1 %   Topical BID Cecilie Lowers, FNP   1 Application at 08/06/23 1625   hydrOXYzine (ATARAX) tablet 25 mg  25 mg Oral TID PRN [name], DO  25 mg at 08/05/23 1256   levothyroxine (SYNTHROID) tablet 75 mcg  75 mcg Oral Q0600 Golda Acre, MD   75 mcg at 08/08/23 1610   LORazepam (ATIVAN) tablet 2 mg  2 mg Oral TID PRN [name], NP   2 mg at 06/03/23 1110   Or   LORazepam (ATIVAN) injection 2 mg  2 mg Intramuscular TID PRN [name], NP       magnesium hydroxide (MILK OF MAGNESIA) suspension 30 mL  30 mL Oral Daily PRN [name], NP   30 mL at 07/18/23 1701   melatonin tablet 5 mg  5 mg Oral QHS Nkwenti, Doris, NP   5 mg at 08/07/23 2117   nicotine polacrilex (NICORETTE) gum 2 mg  2 mg Oral PRN [name], MD   2 mg at 08/08/23 1145   paliperidone (INVEGA) 24 hr tablet 6 mg  6 mg Oral Daily [name], NP   6 mg at 08/07/23 2117   pantoprazole (PROTONIX) EC tablet 40 mg  40 mg Oral Daily Katilynn Sinkler, Nicole Kindred I, NP   40 mg at 08/08/23 0818   polyethylene glycol (MIRALAX / GLYCOLAX) packet 17 g  17 g Oral Daily PRN [name], MD   17 g at 06/07/23 1847   selenium sulfide (SELSUN) 1 % shampoo   Topical Daily PRN Georgiann Cocker, MD   Given at 07/20/23 2218   sertraline (ZOLOFT) tablet 200 mg  200 mg Oral Daily Golda Acre, MD   200 mg at 08/08/23 0818   SUMAtriptan (IMITREX) tablet 25 mg  25 mg Oral BID PRN [name], NP   25 mg at 07/23/23 1828   traZODone (DESYREL) tablet 50  mg  50 mg Oral QHS [name], NP   50 mg at 08/07/23 2117   Vitamin D (Ergocalciferol) (DRISDOL) 1.25 MG (50000 UNIT) capsule 50,000 Units  50,000 Units Oral Q7 days [name], NP   50,000 Units at 08/03/23 1553   Lab Results: No results found for this or any previous visit (from the past 48 hours).  Blood Alcohol level:  Lab Results  Component Value Date   ETH <10 12/19/2022   ETH <10 11/21/2019   Metabolic Disorder Labs: Lab Results  Component Value Date   HGBA1C 5.6 06/16/2023   MPG 114.02 06/16/2023   MPG 79.58 12/21/2022   No results found for: "PROLACTIN" Lab Results  Component Value Date   CHOL 166 08/01/2023   TRIG 165 (H) 08/01/2023   HDL 59 08/01/2023   CHOLHDL 2.8 08/01/2023   VLDL 33 08/01/2023   LDLCALC 74 08/01/2023   LDLCALC 128 (H) 06/16/2023   Physical Findings: AIMS: Facial and Oral Movements Muscles of Facial Expression: None Lips and Perioral Area: None Jaw: None Tongue: None,Extremity Movements Upper (arms, wrists, hands, fingers): None Lower (legs, knees, ankles, toes): None, Trunk Movements Neck, shoulders, hips: None, Global Judgements Severity of abnormal movements overall : None Incapacitation due to abnormal movements: None Patient's awareness of abnormal movements: No Awareness, Dental Status Current problems with teeth and/or dentures?: No Does patient usually wear dentures?: No  CIWA:   N/A COWS:   N/A  Musculoskeletal: Strength & Muscle Tone: within normal limits Gait & Station: normal Patient leans: N/A  Psychiatric Specialty Exam:  Presentation  General Appearance:  Casual; Fairly Groomed  Eye Contact: Fair  Speech: Clear and Coherent; Slow  Speech Volume: Decreased  Handedness: Right   Mood and Affect  Mood: -- (Sad)  Affect: Congruent  Thought Process  Thought Processes: Coherent; Linear; Goal Directed  Descriptions of Associations:Intact  Orientation:Full (Time, Place and  Person)  Thought Content:Logical  History of Schizophrenia/Schizoaffective disorder:Yes  Duration of Psychotic Symptoms:Greater than six months  Hallucinations:Hallucinations: None Description of Command Hallucinations: NA Description of Auditory Hallucinations: NA   Ideas of Reference:None  Suicidal Thoughts:Suicidal Thoughts: No   Homicidal Thoughts:Homicidal Thoughts: No  Sensorium  Memory: Immediate Good; Recent Good; Remote Good  Judgment: Fair  Insight: Fair  Art therapist  Concentration: Good  Attention Span: Good  Recall: Good  Fund of Knowledge: Fair  Language: Fair  Psychomotor Activity  Psychomotor Activity: Psychomotor Activity: Normal  Assets  Assets: Communication Skills; Desire for Improvement; Financial Resources/Insurance; Resilience; Social Support  Sleep  Sleep: Sleep: Good Number of Hours of Sleep: 8  Physical Exam: Physical Exam Vitals and nursing note reviewed.  Constitutional:      Appearance: She is normal weight.  HENT:     Head: Atraumatic.     Nose: Nose normal.  Eyes:     Conjunctiva/sclera: Conjunctivae normal.  Pulmonary:     Effort: No respiratory distress.  Musculoskeletal:        General: Normal range of motion.     Cervical back: Normal range of motion.  Skin:    General: Skin is dry.  Neurological:     General: No focal deficit present.     Mental Status: She is alert and oriented to person, place, and time.    Review of Systems  Constitutional:  Negative for diaphoresis, fever, malaise/fatigue and weight loss.  HENT: Negative.    Respiratory: Negative.    Cardiovascular: Negative.   Gastrointestinal: Negative.   Psychiatric/Behavioral:  Positive for depression. Negative for hallucinations, memory loss, substance abuse and suicidal ideas. The patient is nervous/anxious and has insomnia.   All other systems reviewed and are negative.  Blood pressure 103/70, pulse 88, temperature 98.3 F  (36.8 C), resp. rate 16, height 4\' 11"  (1.499 m), weight 63 kg, SpO2 95%. Body mass index is 28.05 kg/m.  Treatment Plan Summary: Daily contact with patient to assess and evaluate symptoms and progress in treatment and Medication management  Safety and Monitoring: Voluntary admission to inpatient psychiatric unit for safety, stabilization and treatment Daily contact with patient to assess and evaluate symptoms and progress in treatment Patient's case to be discussed in multi-disciplinary team meeting Observation Level : q15 minute checks Vital signs: q12 hours Precautions: Safety  Diagnoses Principal Problem:   Schizoaffective disorder, depressive type (HCC) Active Problems:   GERD (gastroesophageal reflux disease)   Intellectual disability   Tobacco use disorder  Medications  1.  Continue Invega ER 6 mg at bedtime. 2.  Continue sertraline to 200 mg daily. 3.  Continue trazodone 50 mg as needed for insomnia. 4.  Continue to monitor mood behavior and interaction with others. 5.  Continue to encourage unit groups and therapeutic activities. 6.  Continue Synthroid 50 mcg for hypothyroidism 7.  Recheck lipid panel and thyroid to see if labs have normalized. 8.  Check urinalysis due to increased sadness, lethargy, and behavioral change  PRNS -Continue Tylenol 650 mg every 6 hours PRN for mild pain -Continue Maalox 30 mg every 4 hrs PRN for indigestion -Continue Milk of Magnesia as needed every 6 hrs for constipation  Discharge Planning: Social work and case management to assist with discharge planning and identification of hospital follow-up needs prior to discharge Estimated LOS: 5-7 days Discharge Concerns: Need to establish a safety plan;  Medication compliance and effectiveness Discharge Goals: Return home with outpatient referrals for mental health follow-up including medication management/psychotherapy  I certify that inpatient services furnished can reasonably be  expected to improve the patient's condition.    Armandina Stammer, NP 1/30/20253:06 PM    Patient ID: Yolanda [name], female   DOB: 10/19/1975, 48 y.o.   MRN: [id number] Patient ID: Yolanda [name], female   DOB: [birth year], 48 y.o.   MRN: [id number]

## 2023-08-09 MED ORDER — DOCUSATE SODIUM 100 MG PO CAPS: 100.0000 mg | ORAL_CAPSULE | Freq: Every day | ORAL | 0 refills | Status: AC

## 2023-08-09 MED ORDER — PALIPERIDONE ER 6 MG PO TB24: 6.0000 mg | ORAL_TABLET | Freq: Every day | ORAL | 0 refills | Status: AC

## 2023-08-09 MED ORDER — HYDROXYZINE HCL 25 MG PO TABS: 25.0000 mg | ORAL_TABLET | Freq: Three times a day (TID) | ORAL | 0 refills | Status: AC | PRN

## 2023-08-09 MED ORDER — NICOTINE POLACRILEX 2 MG MT GUM: 2.0000 mg | CHEWING_GUM | OROMUCOSAL | Status: AC | PRN

## 2023-08-09 MED ORDER — ATORVASTATIN CALCIUM 10 MG PO TABS: 10.0000 mg | ORAL_TABLET | Freq: Every day | ORAL | 0 refills | Status: AC

## 2023-08-09 MED ORDER — SELENIUM SULFIDE 1 % EX LOTN: 1.0000 | TOPICAL_LOTION | Freq: Every day | CUTANEOUS | 0 refills | Status: AC | PRN

## 2023-08-09 MED ORDER — PANTOPRAZOLE SODIUM 40 MG PO TBEC: 40.0000 mg | DELAYED_RELEASE_TABLET | Freq: Every day | ORAL | 0 refills | Status: AC

## 2023-08-09 MED ORDER — CYANOCOBALAMIN 1000 MCG/ML IJ SOLN: 1000.0000 ug | INTRAMUSCULAR | 0 refills | Status: AC

## 2023-08-09 MED ORDER — TRAZODONE HCL 50 MG PO TABS: 50.0000 mg | ORAL_TABLET | Freq: Every day | ORAL | 0 refills | Status: AC

## 2023-08-09 MED ORDER — SERTRALINE HCL 100 MG PO TABS: 200.0000 mg | ORAL_TABLET | Freq: Every day | ORAL | 0 refills | Status: AC

## 2023-08-09 MED ORDER — LEVOTHYROXINE SODIUM 75 MCG PO TABS: 75.0000 ug | ORAL_TABLET | Freq: Every day | ORAL | 0 refills | Status: AC

## 2023-08-09 MED ORDER — HYDROCORTISONE 1 % EX CREA: TOPICAL_CREAM | Freq: Two times a day (BID) | CUTANEOUS | 0 refills | Status: AC

## 2023-08-09 MED ORDER — VITAMIN D (ERGOCALCIFEROL) 1.25 MG (50000 UNIT) PO CAPS: 50000.0000 [IU] | ORAL_CAPSULE | ORAL | 0 refills | Status: AC

## 2023-08-09 MED ORDER — MELATONIN 5 MG PO TABS: 5.0000 mg | ORAL_TABLET | Freq: Every day | ORAL | 0 refills | Status: AC

## 2023-08-09 MED ORDER — BUSPIRONE HCL 15 MG PO TABS: 15.0000 mg | ORAL_TABLET | Freq: Two times a day (BID) | ORAL | 0 refills | Status: AC

## 2023-08-09 NOTE — Discharge Summary (Signed)
Physician Discharge Summary Note  Patient:  Yolanda [name] is an 48 y.o., female  MRN:  [id number]  DOB:  1975-12-30  Patient phone:  [phone] (home)   Patient address:   [address]  [place] [address],   Total Time spent with patient:  Greater than 30 minutes  Date of Admission:  12/20/2022  Date of Discharge: 08-09-23  Reason for Admission: Complaint of worsening suicidal ideations with plan to stab herself.  Principal Problem: Schizoaffective disorder, depressive type [facility])  Discharge Diagnoses: Principal Problem:   Schizoaffective disorder, depressive type (HCC) Active Problems:   GERD (gastroesophageal reflux disease)   Intellectual disability   Tobacco use disorder  Past Psychiatric History: Schizoaffective disorder, depressive-type, Intellectual disability, Tobacco use disorder.  Past Medical History:  Past Medical History:  Diagnosis Date   Bipolar affect, depressed (HCC)    Constipation 08/17/2022   Depression    Falls 07/21/2022   Fracture of femoral neck, right, closed (HCC) 01/17/2022   Herpes simplex 08/22/2017   Intellectual disability    Open fracture dislocation of right elbow joint 01/17/2022    Past Surgical History:  Procedure Laterality Date   NO PAST SURGERIES     SALPINGECTOMY     Family History: History reviewed. No pertinent family history.  Family Psychiatric  History: See H&P.  Social History:  Social History   Substance and Sexual Activity  Alcohol Use Yes     Social History   Substance and Sexual Activity  Drug Use Yes   Types: Cocaine, Marijuana    Social History   Socioeconomic History   Marital status: Single    Spouse name: Not on file   Number of children: Not on file   Years of education: Not on file   Highest education level: Not on file  Occupational History   Not on file  Tobacco Use   Smoking status: Every Day   Smokeless tobacco: Not on file  Substance and Sexual Activity   Alcohol  use: Yes   Drug use: Yes    Types: Cocaine, Marijuana   Sexual activity: Yes  Other Topics Concern   Not on file  Social History Narrative   Not on file   Social Drivers of Health   Financial Resource Strain: Low Risk  (09/12/2022)   Received from [facility], [facility]   Overall Financial Resource Strain (CARDIA)    Difficulty of Paying Living Expenses: Not hard at all  Food Insecurity: Patient Declined (12/20/2022)   Hunger Vital Sign    Worried About Running Out of Food in the Last Year: Patient declined    Ran Out of Food in the Last Year: Patient declined  Transportation Needs: No Transportation Needs (12/20/2022)   PRAPARE - Administrator, Civil Service (Medical): No    Lack of Transportation (Non-Medical): No  Physical Activity: Not on file  Stress: No Stress Concern Present (07/17/2022)   [facility] - Occupational Stress Questionnaire    Feeling of Stress : Not at all  Social Connections: Unknown (07/16/2022)   Received from [facility], [facility]   Social Network    Social Network: Not on file   Hospital Course: (Per admission evaluation notes): This is the first psychiatric admission in this [facility] in 12 years for this AA female with an extensive hx of mental illnesses & probable polysubstance use disorders. She is admitted to the [facility] from the  [facility] hospital with complain of worsening suicidal ideations with plan to stab herself. Per chart review, patient apparently reported at the ED that she has been depressed for a while & has not been taking her mental health medications. After medical evaluation.clearance, she was transferred to the [facility] for further psychiatric evaluation/treatments. Yolanda [name] presents irritated, labile, angry & tearful. She is not forth-coming with the information needed to help manage her care. She reports, "A friend dropped me at hospital the other day. I  don't know the name of the hospital. I can't remember nothing. All I can tell is, I don't know how to be positive, so I wanted to end my life. I don't know why I feel this way. I have been on medication most of my life for my mental health & I don't know why. I don't know when I took my last medicine. I don't know nothing, damn it. I don't remember nothing".   Upon the decision by her treatment team to discharge Yolanda [name] today, she was seen & evaluated for mood stability. The current laboratory findings were reviewed, stable. The nurses notes & vital signs were reviewed as well. All are stable. At this present time, there are no current mental health or medical issues that should prevent this discharge at this time. Patient is being discharged to continue mental health care & medication management on an outpatient basis as noted below. Yolanda [name]/mother are also aware & agreeable to this discharge.  Although with what seems like an extensive hx of mental health issues & probable previous psychiatric hospitalizations/treatments, this is Yolanda [name]'s first psychiatric admission/discharge summary from this [facility]. She was admitted with complaint of  worsening suicidal ideations with plan to stab herself. She was recommended for mood stabilization treatments by her treatment team after her admission evaluation. And with her consent, she was treated, stabilized & discharged on the medications as listed below on her discharge medication lists. She was also enrolled & participated in the group counseling sessions being offered & held on this unit. She learned coping skills. She presented other significant pre-existing medical conditions that required treatment or monitoring. She was resumed/treated & discharged on all the pertinent medications used for those pre-existing health issues. She tolerated her treatment regimen without any adverse effects or reactions reported.   Richardine's symptoms responded well to her treatment  regimen without any reported side effects or adverse reactions. She remained mentally & medically stable for a long time as placement issues for a stable home remained a challenge. Yolanda [name] does have some physical/mental limitations that created a need for her to go to a stable home-setting after discharge. It took much determination & a long time for our social workers to get this accomplished/secure. Throughout her hospital stay, Yolanda [name] remained committed to taking her recommended treatment regimen while patient waiting for place to call home. There were no behavioral issues reported or documented by the staff. Yolanda [name] displayed on daily basis a good moral character while patiently waiting for the day she will get discharged from [facility]. She is currently mentally & medically stable to be discharged to continue further mental health care/medication management as noted below. She is agreeable that this discharge is warranted.   During the course of her hospitalization, the 15-minute checks were adequate to ensure Yolanda [name]'s safety. Patient did not display any dangerous, violent or suicidal behavior on the unit.  She interacted with the other patients & staff appropriately. She participated appropriately in the group sessions/therapies. Her medications were addressed &  adjusted to meet her needs. She was recommended for outpatient follow-up care & medication management upon discharge to assure her continuity of care.  At the time of discharge, Yolanda [name] is not reporting any acute suicidal/homicidal ideations. She feels more confident about leaving the hospital today to her much anticipated home setting. She currently denies any new issues or concerns. Education and supportive counseling provided throughout her hospital stay & upon discharge.   Today upon her discharge evaluation with her treatment team, Yolanda [name] shares she is doing well & overly happy. She has been beaming with smiles all day today. She denies any  other specific concerns. She is sleeping well. Her appetite is good. She denies other physical complaints. She denies SIHI, AH/VH, delusional thoughts or paranoia. She does not appear to be responding to any internal stimuli. She feels that her medications have been helpful & is in agreement to continue her current treatment regimen as recommended. She was able to engage in safety planning including plan to return to [facility] or contact emergency services if she feels unable to maintain her own safety or the safety of others. Yolanda [name] had no further questions, comments, or concerns. She left [facility] with all personal belongings in no apparent distress. Transportation per her family (mother)..  Physical Findings: AIMS: Facial and Oral Movements Muscles of Facial Expression: None Lips and Perioral Area: None Jaw: None Tongue: None,Extremity Movements Upper (arms, wrists, hands, fingers): None Lower (legs, knees, ankles, toes): None, Trunk Movements Neck, shoulders, hips: None, Global Judgements Severity of abnormal movements overall : None Incapacitation due to abnormal movements: None Patient's awareness of abnormal movements: No Awareness, Dental Status Current problems with teeth and/or dentures?: No Does patient usually wear dentures?: No  CIWA:    COWS:     Musculoskeletal: Strength & Muscle Tone: within normal limits Gait & Station: normal Patient leans: N/A   Psychiatric Specialty Exam:  Presentation  General Appearance:  Casual; Fairly Groomed  Eye Contact: Fair  Speech: Clear and Coherent; Slow  Speech Volume: Decreased  Handedness: Right   Mood and Affect  Mood: -- (Sad)  Affect: Congruent   Thought Process  Thought Processes: Coherent; Linear; Goal Directed  Descriptions of Associations:Intact  Orientation:Full (Time, Place and Person)  Thought Content:Logical  History of Schizophrenia/Schizoaffective disorder:Yes  Duration of Psychotic Symptoms:Greater  than six months  Hallucinations:Hallucinations: None Description of Command Hallucinations: NA Description of Auditory Hallucinations: NA  Ideas of Reference:None  Suicidal Thoughts:Suicidal Thoughts: No  Homicidal Thoughts:Homicidal Thoughts: No   Sensorium  Memory: Immediate Good; Recent Good; Remote Good  Judgment: Fair  Insight: Fair   Art therapist  Concentration: Good  Attention Span: Good  Recall: Good  Fund of Knowledge: Fair  Language: Fair   Psychomotor Activity  Psychomotor Activity: Psychomotor Activity: Normal  Assets  Assets: Communication Skills; Desire for Improvement; Financial Resources/Insurance; Resilience; Social Support  Sleep  Sleep: Sleep: Good Number of Hours of Sleep: 8  Physical Exam: Physical Exam Vitals and nursing note reviewed.  Cardiovascular:     Rate and Rhythm: Normal rate.     Pulses: Normal pulses.  Pulmonary:     Effort: Pulmonary effort is normal.  Genitourinary:    Comments: Deferred Musculoskeletal:        General: Normal range of motion.     Cervical back: Normal range of motion.  Skin:    General: Skin is warm and dry.  Neurological:     General: No focal deficit present.     Mental Status: She  is alert and oriented to person, place, and time. Mental status is at baseline.    Review of Systems  Constitutional:  Negative for chills, diaphoresis and fever.  HENT:  Negative for congestion and sore throat.   Respiratory:  Negative for cough, shortness of breath and wheezing.   Cardiovascular:  Negative for chest pain and palpitations.  Gastrointestinal:  Negative for abdominal pain, constipation, diarrhea, heartburn (Hx of (stable on medication).), nausea and vomiting.  Genitourinary:        Hx of bladder incontinence (stable).  Musculoskeletal:  Negative for joint pain and myalgias.  Skin:  Negative for itching and rash.  Neurological:  Negative for dizziness, tingling, tremors, sensory  change, speech change, focal weakness, seizures, loss of consciousness, weakness and headaches.  Endo/Heme/Allergies:        NKDA  Psychiatric/Behavioral:  Positive for depression (Hx of (stable on medication).). Negative for hallucinations, memory loss, substance abuse (Hx tobacco use disorder) and suicidal ideas. The patient has insomnia (Hx of (stable on medication).). The patient is not nervous/anxious (Stable upon discharge.).    Blood pressure 111/72, pulse 100, temperature 98.3 F (36.8 C), resp. rate 16, height 4\' 11"  (1.499 m), weight 63 kg, SpO2 97%. Body mass index is 28.05 kg/m.   Social History   Tobacco Use  Smoking Status Every Day  Smokeless Tobacco Not on file   Tobacco Cessation:  An FDA-approved tobacco cessation medication recommended at discharge  Blood Alcohol level:  Lab Results  Component Value Date   [facility] [place] [facility] <10 12/19/2022   ETH <10 11/21/2019   Metabolic Disorder Labs:  Lab Results  Component Value Date   HGBA1C 5.6 06/16/2023   MPG 114.02 06/16/2023   MPG 79.58 12/21/2022   No results found for: "PROLACTIN" Lab Results  Component Value Date   CHOL 166 08/01/2023   TRIG 165 (H) 08/01/2023   HDL 59 08/01/2023   CHOLHDL 2.8 08/01/2023   VLDL 33 08/01/2023   LDLCALC 74 08/01/2023   LDLCALC 128 (H) 06/16/2023   See Psychiatric Specialty Exam and Suicide Risk Assessment completed by Attending Physician prior to discharge.  Discharge destination:  Home  Is patient on multiple antipsychotic therapies at discharge:  No   Has Patient had three or more failed trials of antipsychotic monotherapy by history:  No  Recommended Plan for Multiple Antipsychotic Therapies: NA   Allergies as of 08/09/2023   No Known Allergies      Medication List     STOP taking these medications    Invega Sustenna 156 MG/ML Susy injection Generic drug: paliperidone       TAKE these medications      Indication  atorvastatin 10 MG tablet Commonly known as:  LIPITOR Take 1 tablet (10 mg total) by mouth daily. For high cholesterol  Indication: High Amount of Fats in the Blood   busPIRone 15 MG tablet Commonly known as: BUSPAR Take 1 tablet (15 mg total) by mouth 2 (two) times daily. For anxiety What changed: additional instructions  Indication: Anxiety Disorder   cyanocobalamin 1000 MCG/ML injection Commonly known as: VITAMIN B12 Inject 1 mL (1,000 mcg total) into the muscle every 30 (thirty) days. For Vit. B12 replacement. Start taking on: August 31, 2023  Indication: Inadequate Vitamin B12   docusate sodium 100 MG capsule Commonly known as: COLACE Take 1 capsule (100 mg total) by mouth daily. For constipation Start taking on: August 10, 2023  Indication: Constipation   hydrocortisone cream 1 % Apply topically 2 (two) times  daily. For vaginal pain  Indication: Desquamative Inflammatory Vaginitis   hydrOXYzine 25 MG tablet Commonly known as: ATARAX Take 1 tablet (25 mg total) by mouth 3 (three) times daily as needed for anxiety.  Indication: Feeling Anxious   levothyroxine 75 MCG tablet Commonly known as: SYNTHROID Take 1 tablet (75 mcg total) by mouth daily at 6 (six) AM. For low functioning thyroid Start taking on: August 10, 2023  Indication: Underactive Thyroid   melatonin 5 MG Tabs Take 1 tablet (5 mg total) by mouth at bedtime. For sleep  Indication: Trouble Sleeping   nicotine polacrilex 2 MG gum Commonly known as: NICORETTE Take 1 each (2 mg total) by mouth as needed. (May buy from over the counter): For smoking cessation.  Indication: Nicotine Addiction   paliperidone 6 MG 24 hr tablet Commonly known as: INVEGA Take 1 tablet (6 mg total) by mouth daily. For mood control  Indication: Schizoaffective Disorder   pantoprazole 40 MG tablet Commonly known as: PROTONIX Take 1 tablet (40 mg total) by mouth daily. For acid reflux Start taking on: August 10, 2023  Indication: Gastroesophageal Reflux Disease    selenium sulfide 1 % Lotn Commonly known as: SELSUN Apply 1 Application topically daily as needed for irritation (try to use at least twice a week). (May buy from over the counter): For itchy scalp  Indication: Dandruff   sertraline 100 MG tablet Commonly known as: ZOLOFT Take 2 tablets (200 mg total) by mouth daily. For depression Start taking on: August 10, 2023 What changed:  medication strength how much to take additional instructions  Indication: Major Depressive Disorder   traZODone 50 MG tablet Commonly known as: DESYREL Take 1 tablet (50 mg total) by mouth at bedtime. For sleep What changed:  medication strength how much to take when to take this reasons to take this additional instructions  Indication: Trouble Sleeping   Vitamin D (Ergocalciferol) 1.25 MG (50000 UNIT) Caps capsule Commonly known as: DRISDOL Take 1 capsule (50,000 Units total) by mouth every 7 (seven) days. For low vitamin D. Start taking on: August 10, 2023  Indication: Vitamin D Deficiency        Follow-up Information     [facility] Go on 08/13/2023.   Why: You have an appt for [employer] intake on 08/13/23 at 2:30 pm. This appt will be held in person. Please bring a copy of  hospital discharge summary. You will  be transported by DSS of [facility], social worker, Thamas Jaegers [phone]. Contact information: [address] [place] [address] [phone]         [employer]. Go on 08/26/2023.   Why: You have an appt for medication managaement on 08/26/2023 at 11:00 am, this appt will be held in person. Contact information: [address], [place], [place] [address] [phone]        [facility]-Day Program Follow up.   Why: You have been approved for Day Program services with [facility]. You will attend Mon thru Friday 9:00 am-3:00 pm. Tranportation will be provided and will pick you  up at 8:10 am and will be dropped off at home by 3:30 pm. Contact information: [facility]) [address]  [place], [place] [address]  [phone]               Follow-up recommendations: Activity:  As tolerated Diet: As recommended by your primary care doctor. Keep all scheduled follow-up appointments as  recommended.   Comments: Comments: Patient is recommended to follow-up care on an outpatient basis as noted above. Prescriptions sent to pt's pharmacy of choice at discharge.   Patient agreeable to plan.   Given opportunity to ask questions.   Appears to feel comfortable with discharge denies any current suicidal or homicidal thought. Patient is also instructed prior to discharge to: Take all medications as prescribed by his/her mental healthcare provider. Report any adverse effects and or reactions from the medicines to his/her outpatient provider promptly. Patient has been instructed & cautioned: To not engage in alcohol and or illegal drug use while on prescription medicines. In the event of worsening symptoms, patient is instructed to call the crisis hotline, 911 and or go to the nearest ED for appropriate evaluation and treatment of symptoms. To follow-up with his/her primary care provider for your other medical issues, concerns and or health care needs.  Signed: Armandina Stammer, NP, pmhnp, fnp-bc. 08/09/2023, 9:53 AM

## 2023-08-09 NOTE — Group Note (Signed)
Date:  08/09/2023 Time:  1:39 PM  Group Topic/Focus:  Goals Group:   The focus of this group is to help patients establish daily goals to achieve during treatment and discuss how the patient can incorporate goal setting into their daily lives to aide in recovery. Orientation:   The focus of this group is to educate the patient on the purpose and policies of crisis stabilization and provide a format to answer questions about their admission.  The group details unit policies and expectations of patients while admitted.    Participation Level:  Did Not Attend  Participation Quality:   n/a  Affect:   n/a  Cognitive:   n/a  Insight: None  Engagement in Group:   n/a  Modes of Intervention:   n/a  Additional Comments:   Pt did not attend.  Edmund Hilda Kaydra Borgen 08/09/2023, 1:39 PM

## 2023-08-09 NOTE — Progress Notes (Signed)
  [facility] Adult Case Management Discharge Plan :  Will you be returning to the same living situation after discharge:  Yes,  pt will going him with mother, Kalifa Cadden [phone]  while she awaits placement into [facility]. Janora Norlander [phone], group homeowner made aware of discharge.   At discharge, do you have transportation home?: Yes,  pt will be transported by mother. Do you have the ability to pay for your medications: Yes,  pt has active medical coverage  Release of information consent forms completed and in the chart;  Patient's signature needed at discharge.  Patient to Follow up at:  Follow-up Information     [facility] Go on 08/13/2023.   Why: You have an appt for [employer] intake on 08/13/23 at 2:30 pm. This appt will be held in person. Please bring a copy of  hospital discharge summary. You will  be transported by [facility] of [facility], social worker, Thamas Jaegers [phone]. Contact information: [address] [place] [address] [phone]         [employer] [employer] Go on 08/26/2023.   Why: You have an appt for medication managaement on 08/26/2023 at 11:00 am, this appt will be held in person. Contact information: [address], [place], [place] [address] [phone]        [facility]Day Program Follow up.   Why: You have been approved for Day Program services with [facility]. You will attend Mon thru Friday 9:00 am-3:00 pm. Tranportation will be provided and will pick you up at 8:10 am and will be dropped off at home by 3:30 pm. Contact information: [facility]) [address],  [place], [place] [address]  [phone]                Next level of care provider has access to [facility] Link:no  Safety Planning and Suicide Prevention discussed: Yes,  Safety Planning completed  with [facility].     Has patient been  referred to the Quitline?: Patient refused referral for treatment  Patient has been referred for addiction treatment: Patient refused referral for treatment.  Shabrea Weldin, Candace Cruise, LCSWA 08/09/2023, 5:50 PM

## 2023-08-09 NOTE — BHH Suicide Risk Assessment (Signed)
[facility] Discharge Suicide Risk Assessment   Principal Problem: Schizoaffective disorder, depressive type [facility]) Discharge Diagnoses: Principal Problem:   Schizoaffective disorder, depressive type (HCC) Active Problems:   GERD (gastroesophageal reflux disease)   Intellectual disability   Tobacco use disorder   Total Time spent with patient: 30 minutes  Musculoskeletal: Strength & Muscle Tone: within normal limits Gait & Station: normal Patient leans: N/A  Psychiatric Specialty Exam  Presentation  General Appearance:  Casually dressed, overweight, not in any distress.  No EPS.  Eye Contact: Good.   Speech: Spontaneous, slightly slurred and slow.   Mood and Affect  Mood: Euthymic.  Affect: Full range and mood congruent.   Thought Process  Thought Processes: Decreased speed of thoughts but goal-directed.   Descriptions of Associations:  Linear.   Orientation:Full (Time, Place and Person)   Thought Content: Future oriented.  No negative ruminations.  No guilty ruminations.  No suicidal thoughts.  No homicidal thoughts.  No thoughts of violence.  No delusional theme.     Hallucinations: No hallucination in any modality.     Sensorium  Memory: Immediate Good; Recent Good   Judgment: Fair   Insight: Fair     Chartered certified accountant: Fair   Attention Span: Fair   Recall: [employer] of Knowledge: Fair   Language: Fair     Psychomotor Activity  Normal psychomotor activity.  Physical Exam: Physical Exam ROS Blood pressure 111/72, pulse 100, temperature 98.3 F (36.8 C), resp. rate 16, height 4\' 11"  (1.499 m), weight 63 kg, SpO2 97%. Body mass index is 28.05 kg/m.  Mental Status Per Nursing Assessment::   On Admission:  Suicidal ideation indicated by patient  Demographic Factors:  Low socioeconomic status and Unemployed  Loss Factors: Decrease in vocational status and Financial problems/change in socioeconomic  status  Historical Factors: Family history of suicide, Family history of mental illness or substance abuse, and Impulsivity  Risk Reduction Factors:   Living with another person, especially a relative, Positive social support, Positive therapeutic relationship, and Positive coping skills or problem solving skills  Continued Clinical Symptoms:  There are no residual mood symptoms.  No residual psychotic symptoms.  There is no fertility thoughts.  Cognitive Features That Contribute To Risk:  None    Suicide Risk:  Minimal: Patient is not endorsing any suicidal thoughts.  She is not endorsing any homicidal thoughts.  Patient is not endorsing any thoughts of violence.  Modifiable risk factor targeted during this admission as mood symptoms.  She has responded well to treatment.  She is tolerating her medications well.  She has good support from her family.  Patient will eventually go to a group home.  There are no new psychosocial stressors. At this point in time, patient is not a danger to herself or to others.  She is stable for discharge into a lower level of care.  Follow-up Information     [facility] Go on 08/13/2023.   Why: You have an appt for [facility] intake on 08/13/23 at 2:30 pm. This appt will be held in person. Please bring a copy of  hospital discharge summary. You will  be transported by DSS of [name], social worker, Thamas Jaegers [phone]. Contact information: [address] [place] [address] [phone]         [facility]. Go on 08/26/2023.   Why: You have an appt for medication managaement on 08/26/2023 at 11:00 am, this appt will be  held in person. Contact information: [address], [place], [place] [address] [phone]        [facility]-Day Program Follow up.   Why: You have been approved for Day Program services with [facility]. You will attend Mon thru  Friday 9:00 am-3:00 pm. Tranportation will be provided and will pick you up at 8:10 am and will be dropped off at home by 3:30 pm. Contact information: [facility]) [address],  [place], [place] [address]  [phone]                Plan Of Care/Follow-up recommendations:  See discharge summary.  Georgiann Cocker, MD 08/09/2023, 1:20 PM

## 2023-08-09 NOTE — BHH Group Notes (Signed)

## 2023-08-09 NOTE — Progress Notes (Signed)
CSW received call from [facility] group homeowner, Janora Norlander [phone] who reported she has received temporary approval for pt's contract. Ms. Roxan Hockey sent CSW a copy of approval which was  back dated to 08/01/23 thru 05/06/24. Ms. Roxan Hockey reported she will not receive pt into group home until she has final contract.  CSW assisted Ms. Roxan Hockey in coordinating providers for outpatient services. Pt scheduled for medication management with [facility] on 08/26/23 at 11:00 am. Pt will receive medications from [facility] [phone] who requested a completed FL2. FL2 sent. Pt has an appt for [facility] ([facility]) with [facility] in [place] on 08/13/23 at 2:30 pm. [facility] will meet with pt face-to-face 2-3 times per week for therapeutic interventions that will assist in helping with emotional, behavioral, social, safety, medical and health, and educational needs.  Pt will be transported by DSS of [facility], Thamas Jaegers to [employer]. DSS given pertinent information regarding appt.   Ms. Roxan Hockey will schedule appt for PCP and dentist. CSW shared pt has been complaining of dental pain.  Ms. Roxan Hockey also requested a copy of pt's Crisis Plan devised by [facility], CSW will acquire and send.  TOC Supervisor, [name] and CSW spoke with pt's mother, Katriana Dortch [phone], regarding pt disposition of transitioning back into her home until group home placement has finalized. CSW addressed mother's concerns about pt being home alone all day and again, shared that pt has been approved for a Day Program thru [facility] where she will attend Monday-Friday 9:00am-3:00 pm. Pt will be transported via [facility] transport, picked up at 8:15 am and will leave the program at 3:00 pm. CSW shared information about services scheduled with Ms. Roxan Hockey.    CSW shared that pt will be ready for discharge on 08/09/23 at 3:00 pm, mother reported she will not be able to come  until 4:30 pm, due to her job.   DSS of [facility], social worker Thamas Jaegers made aware of disposition plan and agrees. CSW completed Safety Planning with mother.  CSW will update Treatment team.

## 2023-08-09 NOTE — BHH Suicide Risk Assessment (Signed)
[facility] INPATIENT:  Family/Significant Other Suicide Prevention Education  Suicide Prevention Education:  Education Completed; Yolanda [name],mother [phone]  (name of family member/significant other) has been identified by the patient as the family member/significant other with whom the patient will be residing, and identified as the person(s) who will aid the patient in the event of a mental health crisis (suicidal ideations/suicide attempt).  With written consent from the patient, the family member/significant other has been provided the following suicide prevention education, prior to the and/or following the discharge of the patient.  CSW completed Safety Planning with pt's mother who reported no firearms in the home. Mother reported she will lock away all medications, knives and sharps in her room. Pt does not have access to her room. Mother inquired about the group home placement and wanted to know, " why is it taking so long for Yolanda [name] to go there". CSW shared there has been some discrepancies around paperwork and it still has not been finalized. [facility] and group home continues to work on the process. Mother reported that she was appreciative of what the hospital has b done for her daughter and will pick her up at 4:30 pm on 08/09/23.   The suicide prevention education provided includes the following: Suicide risk factors Suicide prevention and interventions National Suicide Hotline telephone number [facility] [facility] assessment telephone number [facility] Emergency Assistance 911 [facility] and/or Residential Mobile Crisis Unit telephone number  Request made of family/significant other to: Remove weapons (e.g., guns, rifles, knives), all items previously/currently identified as safety concern.   Remove drugs/medications (over-the-counter, prescriptions, illicit drugs), all items previously/currently identified as a safety concern.  The family member/significant other  verbalizes understanding of the suicide prevention education information provided.  The family member/significant other agrees to remove the items of safety concern listed above.  Yolanda [name] R 08/09/2023, 5:50 AM

## 2023-08-09 NOTE — Progress Notes (Signed)
Patient discharged to home accompanied by mother. Discharge instructions, prescriptions, filled medications,all required discharge documents and information about follow-up appointment given to pt with verbalization of understanding. All personal belongings returned to pt at time of discharge. Plan of Care and Education resolved. Pt escorted to lobby by RN at 1640.  08/09/23 0905  Psych Admission Type (Psych Patients Only)  Admission Status Involuntary  Psychosocial Assessment  Patient Complaints None  Eye Contact Fair  Facial Expression Flat  Affect Anxious  Speech Soft  Interaction Assertive  Motor Activity Slow  Appearance/Hygiene Poor hygiene  Behavior Characteristics Cooperative;Appropriate to situation  Mood Anxious;Pleasant  Thought Process  Coherency WDL  Content WDL  Delusions None reported or observed  Perception WDL  Hallucination None reported or observed  Judgment Limited  Confusion None  Danger to Self  Current suicidal ideation? Denies  Self-Injurious Behavior No self-injurious ideation or behavior indicators observed or expressed   Agreement Not to Harm Self Yes  Description of Agreement Verbal  Danger to Others  Danger to Others None reported or observed  Danger to Others Abnormal  Harmful Behavior to others No threats or harm toward other people  Destructive Behavior No threats or harm toward property

## 2023-08-09 NOTE — Plan of Care (Signed)
  Problem: Coping: Goal: Coping ability will improve Outcome: Progressing   Problem: Health Behavior/Discharge Planning: Goal: Identification of resources available to assist in meeting health care needs will improve Outcome: Progressing   Problem: Medication: Goal: Compliance with prescribed medication regimen will improve Outcome: Progressing   Problem: Safety: Goal: Periods of time without injury will increase Outcome: Progressing

## 2023-08-09 NOTE — [facility] Group Notes (Signed)
Spiritual care group facilitated by Chaplain Katy Denessa Cavan, B[facility]  Group focused on topic of strength. Group members reflected on what thoughts and feelings emerge when they hear this topic. They then engaged in facilitated dialog around how strength is present in their lives. This dialog focused on representing what strength had been to them in their lives (images and patterns given) and what they saw as helpful in their life now (what they needed / wanted).  Activity drew on narrative framework.  Patient Progress: Did not attend.  

## 2023-08-09 NOTE — Group Note (Signed)
Recreation Therapy Group Note   Group Topic:Problem Solving  Group Date: 08/09/2023 Start Time: 0930 End Time: 1015 Facilitators: Michon Kaczmarek-McCall, LRT,CTRS Location: [address] Dayroom   Group Topic: Problem Solving  Goal Area(s) Addresses:  Patient will effectively work in a team with other group members. Patient will verbalize importance of using appropriate problem solving techniques.  Patient will identify positive change associated with effective problem solving skills.   Intervention: Worksheets, Music  Activity: Patients were given two sheets of brain teasers. Patients were to work through each puzzle to come up with the answer. Patients had the option of working together or individually.    Education: Problem solving, Use of skills post discharge  Education Outcome: Acknowledges understanding/In group clarification offered/Needs additional education.    Affect/Mood: Appropriate   Participation Level: Active   Participation Quality: Independent   Behavior: Attentive    Speech/Thought Process: Relevant   Insight: Fair   Judgement: Fair    Modes of Intervention: Music and Worksheet   Patient Response to Interventions:  Interested    Education Outcome:  In group clarification offered    Clinical Observations/Individualized Feedback: Pt worked well with peer in completing activity. Pt stated she was going home today so she was excited and focused as best she could with her excitement. Pt was appropriate during group session.     Plan: Continue to engage patient in RT group sessions 2-3x/week.   Denora Wysocki-McCall, LRT,CTRS 08/09/2023 11:37 AM

## 2024-03-21 ENCOUNTER — Emergency Department (HOSPITAL_BASED_OUTPATIENT_CLINIC_OR_DEPARTMENT_OTHER)
Admission: EM | Admit: 2024-03-21 | Discharge: 2024-03-21 | Disposition: A | Discharge status: Home / Self Care | Payer: MEDICAID | Source: Ambulatory Visit | Attending: Emergency Medicine | Admitting: Emergency Medicine

## 2024-03-21 ENCOUNTER — Encounter (HOSPITAL_BASED_OUTPATIENT_CLINIC_OR_DEPARTMENT_OTHER): Payer: Self-pay | Admitting: Emergency Medicine

## 2024-03-21 ENCOUNTER — Other Ambulatory Visit: Payer: Self-pay

## 2024-03-21 DIAGNOSIS — H1032 Unspecified acute conjunctivitis, left eye: Secondary | ICD-10-CM | POA: Diagnosis not present

## 2024-03-21 DIAGNOSIS — H109 Unspecified conjunctivitis: Secondary | ICD-10-CM

## 2024-03-21 DIAGNOSIS — H5789 Other specified disorders of eye and adnexa: Secondary | ICD-10-CM | POA: Diagnosis present

## 2024-03-21 LAB — CBC WITH DIFFERENTIAL/PLATELET
Abs Immature Granulocytes: 0.03 K/uL (ref 0.00–0.07)
Basophils Absolute: 0 K/uL (ref 0.0–0.1)
Basophils Relative: 1 %
Eosinophils Absolute: 0.3 K/uL (ref 0.0–0.5)
Eosinophils Relative: 4 %
HCT: 39.5 % (ref 36.0–46.0)
Hemoglobin: 12.6 g/dL (ref 12.0–15.0)
Immature Granulocytes: 0 %
Lymphocytes Relative: 32 %
Lymphs Abs: 2.4 K/uL (ref 0.7–4.0)
MCH: 27.8 pg (ref 26.0–34.0)
MCHC: 31.9 g/dL (ref 30.0–36.0)
MCV: 87 fL (ref 80.0–100.0)
Monocytes Absolute: 0.5 K/uL (ref 0.1–1.0)
Monocytes Relative: 7 %
Neutro Abs: 4.2 K/uL (ref 1.7–7.7)
Neutrophils Relative %: 56 %
Platelets: 261 K/uL (ref 150–400)
RBC: 4.54 MIL/uL (ref 3.87–5.11)
RDW: 14.4 % (ref 11.5–15.5)
WBC: 7.5 K/uL (ref 4.0–10.5)
nRBC: 0 % (ref 0.0–0.2)

## 2024-03-21 LAB — BASIC METABOLIC PANEL WITH GFR
Anion gap: 11 (ref 5–15)
BUN: 9 mg/dL (ref 6–20)
CO2: 24 mmol/L (ref 22–32)
Calcium: 8.8 mg/dL — ABNORMAL LOW (ref 8.9–10.3)
Chloride: 104 mmol/L (ref 98–111)
Creatinine, Ser: 0.73 mg/dL (ref 0.44–1.00)
GFR, Estimated: >60 mL/min (ref >60)
Glucose, Bld: 89 mg/dL (ref 70–99)
Potassium: 4.5 mmol/L (ref 3.5–5.1)
Sodium: 138 mmol/L (ref 135–145)

## 2024-03-21 MED ORDER — ERYTHROMYCIN 5 MG/GM OP OINT
TOPICAL_OINTMENT | Freq: Once | OPHTHALMIC | Status: AC
  Filled 2024-03-21: qty 3.5

## 2024-03-21 MED ORDER — ERYTHROMYCIN 5 MG/GM OP OINT: TOPICAL_OINTMENT | OPHTHALMIC | 0 refills | Status: AC

## 2024-03-21 MED ORDER — TETRACAINE HCL 0.5 % OP SOLN
1.0000 [drp] | Freq: Once | OPHTHALMIC | Status: AC
  Administered 2024-03-21: 1 [drp] via OPHTHALMIC
  Filled 2024-03-21: qty 4

## 2024-03-21 MED ORDER — FLUORESCEIN SODIUM 1 MG OP STRP
1.0000 | ORAL_STRIP | Freq: Once | OPHTHALMIC | Status: AC
  Administered 2024-03-21: 1 via OPHTHALMIC
  Filled 2024-03-21: qty 1

## 2024-03-21 NOTE — ED Provider Notes (Signed)
 [facility] Provider Note   CSN: [id number] Arrival date & time: 03/21/24  1258     Patient presents with: Eye Problem (LEFT)   Yolanda [name] is a 48 y.o. female with PMHx intellectual disability, bipolar 1 disorder, schizoaffective disorder, GERD, who presents to the ED concerned  for left eye drainage, foreign body sensation and blurry vision x3 weeks. Symptoms started after something hit patient in the eye at a backyard cookout. Patient seen at [facility] and received cipro eye drops on 8/28 but symptoms persists. Patient endorsing foreign body sensation to lateral lower left eye. Patient does not wear contact lenses and does not follow with an outpatient ophthalmologist. Patient denies fever, vomiting, diarrhea.     Eye Problem      Prior to Admission medications   Medication Sig Start Date End Date Taking? Authorizing Provider  erythromycin  ophthalmic ointment Place a 1/2 inch ribbon of ointment into the lower eyelid 4x times daily. 03/21/24  Yes Sharonne Ricketts, Nidia F, PA-C  atorvastatin  (LIPITOR) 10 MG tablet Take 1 tablet (10 mg total) by mouth daily. For high cholesterol 08/09/23   Collene Gouge I, NP  busPIRone  (BUSPAR ) 15 MG tablet Take 1 tablet (15 mg total) by mouth 2 (two) times daily. For anxiety 08/09/23   Collene Gouge I, NP  cyanocobalamin  (VITAMIN B12) 1000 MCG/ML injection Inject 1 mL (1,000 mcg total) into the muscle every 30 (thirty) days. For Vit. B12 replacement. 08/31/23   Collene Gouge I, NP  docusate sodium  (COLACE) 100 MG capsule Take 1 capsule (100 mg total) by mouth daily. For constipation 08/10/23   Collene Gouge I, NP  hydrocortisone  cream 1 % Apply topically 2 (two) times daily. For vaginal pain 08/09/23   Collene Gouge I, NP  hydrOXYzine  (ATARAX ) 25 MG tablet Take 1 tablet (25 mg total) by mouth 3 (three) times daily as needed for anxiety. 08/09/23   Collene Gouge I, NP  levothyroxine  (SYNTHROID ) 75 MCG tablet Take 1  tablet (75 mcg total) by mouth daily at 6 (six) AM. For low functioning thyroid 08/10/23   Collene Gouge I, NP  melatonin 5 MG TABS Take 1 tablet (5 mg total) by mouth at bedtime. For sleep 08/09/23   Collene Gouge I, NP  nicotine  polacrilex (NICORETTE ) 2 MG gum Take 1 each (2 mg total) by mouth as needed. (May buy from over the counter): For smoking cessation. 08/09/23   Collene Gouge I, NP  paliperidone  (INVEGA ) 6 MG 24 hr tablet Take 1 tablet (6 mg total) by mouth daily. For mood control 08/09/23   Collene Gouge I, NP  pantoprazole  (PROTONIX ) 40 MG tablet Take 1 tablet (40 mg total) by mouth daily. For acid reflux 08/10/23   Collene Gouge I, NP  selenium  sulfide (SELSUN ) 1 % LOTN Apply 1 Application topically daily as needed for irritation (try to use at least twice a week). (May buy from over the counter): For itchy scalp 08/09/23   Collene Gouge I, NP  sertraline  (ZOLOFT ) 100 MG tablet Take 2 tablets (200 mg total) by mouth daily. For depression 08/10/23   Collene Gouge I, NP  traZODone  (DESYREL ) 50 MG tablet Take 1 tablet (50 mg total) by mouth at bedtime. For sleep 08/09/23   Collene Gouge I, NP  Vitamin D , Ergocalciferol , (DRISDOL ) 1.25 MG (50000 UNIT) CAPS capsule Take 1 capsule (50,000 Units total) by mouth every 7 (seven) days. For low vitamin D . 08/10/23   Collene Gouge FERNS, NP  Allergies: Patient has no known allergies.    Review of Systems  Eyes:  Positive for pain.    Updated Vital Signs BP 107/78 (BP Location: Right Arm)   Pulse 84   Temp 98.5 F (36.9 C) (Oral)   Resp 18   Ht 4' 11 (1.499 m)   Wt 83 kg   SpO2 97%   BMI 36.96 kg/m   Physical Exam Vitals and nursing note reviewed.  Constitutional:      General: She is not in acute distress.    Appearance: She is not ill-appearing or toxic-appearing.  HENT:     Head: Normocephalic and atraumatic.  Eyes:     General: No scleral icterus.       Right eye: No discharge.        Left eye: No discharge.     Conjunctiva/sclera: Conjunctivae  normal.     Comments: Left eye: Diffuse conjunctival erythema. EOM intact without pain. watery discharge. Fluorescin staining without uptake. IOP 23. PERRL. Pupil intact. No obvious periorbital swelling.  Eyelids inverted without signs of foreign body.  Cardiovascular:     Rate and Rhythm: Normal rate.  Pulmonary:     Effort: Pulmonary effort is normal.  Abdominal:     General: Abdomen is flat.  Skin:    General: Skin is warm and dry.  Neurological:     General: No focal deficit present.     Mental Status: She is alert. Mental status is at baseline.  Psychiatric:        Mood and Affect: Mood normal.        Behavior: Behavior normal.     (all labs ordered are listed, but only abnormal results are displayed) Labs Reviewed  BASIC METABOLIC PANEL WITH GFR - Abnormal; Notable for the following components:      Result Value   Calcium  8.8 (*)    All other components within normal limits  CBC WITH DIFFERENTIAL/PLATELET    EKG: None  Radiology: No results found.   Procedures   Medications Ordered in the ED  erythromycin  ophthalmic ointment (has no administration in time range)  fluorescein  ophthalmic strip 1 strip (1 strip Both Eyes Given by Other 03/21/24 1555)  tetracaine  (PONTOCAINE) 0.5 % ophthalmic solution 1 drop (1 drop Both Eyes Given by Other 03/21/24 1555)                                    Medical Decision Making Risk Prescription drug management.   This patient presents to the ED for concern of eye pain, this involves an extensive number of treatment options, and is a complaint that carries with it a high risk of complications and morbidity.  The differential diagnosis includes blepharitis, viral/bacterial conjunctivitis, corneal abrasion, dry eye syndrome, subconjunctival hemorrhage, acute angle-closure glaucoma, iritis, keratitis, scleritis   Co morbidities that complicate the patient evaluation  intellectual disability, bipolar 1 disorder, schizoaffective  disorder, GERD   Additional history obtained:  Additional history obtained from 8/28 UC note: patient seen for left eye pain and discharge. Discharged with ophtho follow up and cipro drops.    Problem List / ED Course / Critical interventions / Medication management  Patient presents ED concern for left eye pain and discharge that has been going on for at least 3 weeks.  Family at bedside stating that symptoms started while at a backyard cookout and patient stated that she felt something get caught  in her eye.  Physical exam with conjunctival erythema and watery discharge but is otherwise reassuring.  No obvious periorbital swelling or erythema appreciated.  Doubt periorbital cellulitis given reassuring EOMs and rest of reassuring physical exam. I Ordered, and personally interpreted labs.  CBC without leukocytosis or anemia.  BMP reassuring. I have reviewed the patients home medicines and have made adjustments as needed. I requested consultation with the ophthalmologist on-call Dr. Pecen,  and discussed lab and imaging findings as well as pertinent plan - they recommend: erythromycin  eye ointment and follow-up at their office early Monday morning. Shared all results with patient and caregiver at bedside.  Stressed with patient and caregiver the importance of following up with his ophthalmology appointment as it could lead to permanent complications of this eye.  They verbalized understanding of plan and agreed to follow-up with ophthalmology office at 8 AM today morning.  The patient has been appropriately medically screened and/or stabilized in the ED. I have low suspicion for any other emergent medical condition which would require further screening, evaluation or treatment in the ED or require inpatient management. At time of discharge the patient is hemodynamically stable and in no acute distress. I have discussed work-up results and diagnosis with patient and answered all questions. Patient is  agreeable with discharge plan. We discussed strict return precautions for returning to the emergency department and they verbalized understanding.     Social Determinants of Health:  Intellectual disability.        Final diagnoses:  Bacterial conjunctivitis    ED Discharge Orders          Ordered    erythromycin  ophthalmic ointment        03/21/24 1637               Hoy Nidia FALCON, [place] 03/21/24 1641    Ruthe Cornet, DO 03/21/24 1929

## 2024-03-21 NOTE — ED Notes (Addendum)
 Pt alert and oriented X 4 at the time of discharge. RR even and unlabored. No acute distress noted. Pt & caregiver verbalized understanding of discharge instructions as discussed. Pt ambulatory to lobby at time of discharge.

## 2024-03-21 NOTE — Discharge Instructions (Addendum)
 As discussed, you will need to follow-up with the ophthalmologist at 8 AM Monday morning.  Please bring your ID and insurance information to this appointment. Their office information is listed in this discharge paperwork.  Please also place erythromycin  eye ointment to your eye 4 times daily.  Seek emergency care if experiencing any new or worsening symptoms.

## 2024-03-21 NOTE — ED Notes (Signed)
RT unable to obtain IV access. 

## 2024-03-21 NOTE — ED Triage Notes (Addendum)
 Left eye drainage and blurry vision since 8/28. Seen at [facility] and prescribed eye drops (cipro) which she administered for 10 days. Sx persisted. UC sent patient here to r/o periorbital cellulitis. Patient c/o pressure under left eye and intermittent headaches.
